# Patient Record
Sex: Female | Born: 1960
Health system: Southern US, Community
[De-identification: ages and names within clinical notes are randomized; demographics above are authoritative.]

## PROBLEM LIST (undated history)

## (undated) DIAGNOSIS — H409 Unspecified glaucoma: Secondary | ICD-10-CM

## (undated) DIAGNOSIS — N61 Mastitis without abscess: Secondary | ICD-10-CM

## (undated) DIAGNOSIS — Z87442 Personal history of urinary calculi: Secondary | ICD-10-CM

## (undated) DIAGNOSIS — Z8719 Personal history of other diseases of the digestive system: Secondary | ICD-10-CM

## (undated) DIAGNOSIS — D649 Anemia, unspecified: Secondary | ICD-10-CM

## (undated) DIAGNOSIS — C801 Malignant (primary) neoplasm, unspecified: Secondary | ICD-10-CM

## (undated) DIAGNOSIS — Z808 Family history of malignant neoplasm of other organs or systems: Secondary | ICD-10-CM

## (undated) DIAGNOSIS — K8020 Calculus of gallbladder without cholecystitis without obstruction: Secondary | ICD-10-CM

## (undated) DIAGNOSIS — C50919 Malignant neoplasm of unspecified site of unspecified female breast: Secondary | ICD-10-CM

## (undated) DIAGNOSIS — Z803 Family history of malignant neoplasm of breast: Secondary | ICD-10-CM

## (undated) DIAGNOSIS — L409 Psoriasis, unspecified: Secondary | ICD-10-CM

## (undated) DIAGNOSIS — E669 Obesity, unspecified: Secondary | ICD-10-CM

## (undated) DIAGNOSIS — Z8042 Family history of malignant neoplasm of prostate: Secondary | ICD-10-CM

## (undated) DIAGNOSIS — Z800 Family history of malignant neoplasm of digestive organs: Secondary | ICD-10-CM

## (undated) HISTORY — DX: Family history of malignant neoplasm of other organs or systems: Z80.8

## (undated) HISTORY — PX: EYE SURGERY: SHX253

## (undated) HISTORY — PX: GLAUCOMA SURGERY: SHX656

## (undated) HISTORY — DX: Family history of malignant neoplasm of breast: Z80.3

## (undated) HISTORY — DX: Family history of malignant neoplasm of digestive organs: Z80.0

## (undated) HISTORY — DX: Family history of malignant neoplasm of prostate: Z80.42

---

## 2005-01-08 ENCOUNTER — Emergency Department (HOSPITAL_COMMUNITY): Admission: EM | Admit: 2005-01-08 | Discharge: 2005-01-08 | Payer: Self-pay | Admitting: Emergency Medicine

## 2011-02-21 ENCOUNTER — Encounter: Payer: Self-pay | Admitting: *Deleted

## 2011-02-21 ENCOUNTER — Emergency Department (HOSPITAL_BASED_OUTPATIENT_CLINIC_OR_DEPARTMENT_OTHER)
Admission: EM | Admit: 2011-02-21 | Discharge: 2011-02-21 | Disposition: A | Discharge status: Home / Self Care | Payer: Self-pay | Attending: Emergency Medicine | Admitting: Emergency Medicine

## 2011-02-21 DIAGNOSIS — R1011 Right upper quadrant pain: Secondary | ICD-10-CM | POA: Insufficient documentation

## 2011-02-21 DIAGNOSIS — R112 Nausea with vomiting, unspecified: Secondary | ICD-10-CM | POA: Insufficient documentation

## 2011-02-21 DIAGNOSIS — K8020 Calculus of gallbladder without cholecystitis without obstruction: Secondary | ICD-10-CM | POA: Insufficient documentation

## 2011-02-21 HISTORY — DX: Calculus of gallbladder without cholecystitis without obstruction: K80.20

## 2011-02-21 LAB — DIFFERENTIAL
Basophils Absolute: 0 10*3/uL (ref 0.0–0.1)
Basophils Relative: 0 % (ref 0–1)
Eosinophils Absolute: 0 10*3/uL (ref 0.0–0.7)
Eosinophils Relative: 0 % (ref 0–5)
Lymphocytes Relative: 14 % (ref 12–46)
Lymphs Abs: 1.2 10*3/uL (ref 0.7–4.0)
Monocytes Absolute: 0.3 10*3/uL (ref 0.1–1.0)
Monocytes Relative: 4 % (ref 3–12)
Neutro Abs: 6.9 10*3/uL (ref 1.7–7.7)
Neutrophils Relative %: 81 % — ABNORMAL HIGH (ref 43–77)

## 2011-02-21 LAB — COMPREHENSIVE METABOLIC PANEL
ALT: 28 U/L (ref 0–35)
AST: 33 U/L (ref 0–37)
Albumin: 3.9 g/dL (ref 3.5–5.2)
Alkaline Phosphatase: 120 U/L — ABNORMAL HIGH (ref 39–117)
BUN: 17 mg/dL (ref 6–23)
CO2: 22 mEq/L (ref 19–32)
Calcium: 9.2 mg/dL (ref 8.4–10.5)
Chloride: 103 mEq/L (ref 96–112)
Creatinine, Ser: 0.8 mg/dL (ref 0.50–1.10)
GFR calc Af Amer: >60 mL/min (ref >60)
GFR calc non Af Amer: >60 mL/min (ref >60)
Glucose, Bld: 135 mg/dL — ABNORMAL HIGH (ref 70–99)
Potassium: 4.1 mEq/L (ref 3.5–5.1)
Sodium: 137 mEq/L (ref 135–145)
Total Bilirubin: 0.4 mg/dL (ref 0.3–1.2)
Total Protein: 7.9 g/dL (ref 6.0–8.3)

## 2011-02-21 LAB — CBC
HCT: 43.6 % (ref 36.0–46.0)
Hemoglobin: 15.2 g/dL — ABNORMAL HIGH (ref 12.0–15.0)
MCH: 32.4 pg (ref 26.0–34.0)
MCHC: 34.9 g/dL (ref 30.0–36.0)
MCV: 93 fL (ref 78.0–100.0)
Platelets: 203 10*3/uL (ref 150–400)
RBC: 4.69 MIL/uL (ref 3.87–5.11)
RDW: 13.2 % (ref 11.5–15.5)
WBC: 8.4 10*3/uL (ref 4.0–10.5)

## 2011-02-21 LAB — LIPASE, BLOOD: Lipase: 25 U/L (ref 11–59)

## 2011-02-21 MED ORDER — HYDROCODONE-ACETAMINOPHEN 5-325 MG PO TABS: 1.0000 | ORAL_TABLET | ORAL | Status: AC | PRN

## 2011-02-21 MED ORDER — ONDANSETRON HCL 4 MG/2ML IJ SOLN
INTRAMUSCULAR | Status: AC
  Administered 2011-02-21: 4 mg via INTRAVENOUS
  Filled 2011-02-21: qty 2

## 2011-02-21 MED ORDER — ONDANSETRON HCL 4 MG/2ML IJ SOLN
4.0000 mg | Freq: Once | INTRAMUSCULAR | Status: AC
  Administered 2011-02-21: 4 mg via INTRAVENOUS
  Filled 2011-02-21: qty 2

## 2011-02-21 MED ORDER — HYDROMORPHONE HCL 1 MG/ML IJ SOLN
1.0000 mg | Freq: Once | INTRAMUSCULAR | Status: AC
  Administered 2011-02-21: 1 mg via INTRAVENOUS
  Filled 2011-02-21: qty 1

## 2011-02-21 MED ORDER — ONDANSETRON HCL 4 MG PO TABS: 4.0000 mg | ORAL_TABLET | Freq: Four times a day (QID) | ORAL | Status: AC

## 2011-02-21 MED ORDER — SODIUM CHLORIDE 0.9 % IV SOLN
999.0000 mL | Freq: Once | INTRAVENOUS | Status: AC
  Administered 2011-02-21: 1000 mL via INTRAVENOUS

## 2011-02-21 MED ORDER — LORAZEPAM 2 MG/ML IJ SOLN
1.0000 mg | Freq: Once | INTRAMUSCULAR | Status: AC
  Administered 2011-02-21: 1 mg via INTRAVENOUS
  Filled 2011-02-21: qty 1

## 2011-02-21 MED ORDER — ONDANSETRON HCL 4 MG/2ML IJ SOLN
4.0000 mg | Freq: Once | INTRAMUSCULAR | Status: AC
  Administered 2011-02-21: 4 mg via INTRAVENOUS

## 2011-02-21 MED ORDER — MORPHINE SULFATE 4 MG/ML IJ SOLN
4.0000 mg | Freq: Once | INTRAMUSCULAR | Status: AC
  Administered 2011-02-21: 4 mg via INTRAVENOUS
  Filled 2011-02-21: qty 1

## 2011-02-21 NOTE — Discharge Instructions (Signed)
Abdominal Pain Abdominal pain can be caused by many things. Your caregiver decides the seriousness of your pain by an examination and possibly blood tests and X-rays. Many cases can be observed and treated at home. Most abdominal pain is not caused by a disease and will probably improve without treatment. However, in many cases, more time must pass before a clear cause of the pain can be found. Before that point, it may not be known if you need more testing, or if hospitalization or surgery is needed. HOME CARE INSTRUCTIONS  Do not take laxatives unless directed by your caregiver.   Take pain medicine only as directed by your caregiver.   Only take over-the-counter or prescription medicines for pain, discomfort, or fever as directed by your caregiver.   Try a clear liquid diet (broth, tea, or water) for 1 day or as ordered by your caregiver. Slowly move to a bland diet as tolerated.  SEEK IMMEDIATE MEDICAL CARE IF:  The pain does not go away.   You or your child has an oral temperature above 100.4, not controlled by medicine.   You keep throwing up (vomiting).   The pain is felt only in portions of the abdomen. Pain in the right side could possibly be appendicitis. In an adult, pain in the left lower portion of the abdomen could be colitis or diverticulitis.   You pass bloody or black tarry stools.  MAKE SURE YOU:  Understand these instructions.   Will watch your condition.   Will get help right away if you are not doing well or get worse.  Document Released: 02/21/2005 Document Re-Released: 08/08/2009 [facility] Patient Information 2011 [place], [place].Cholelithiasis, Gallbladder Disease (Gallstones) Gallstones are a form of gallbladder disease. When there is an infection of the gallbladder it is called cholecystitis. It is usually caused by a build-up of stones (gallstones or cholelithiasis) in your gallbladder. The gallbladder is not an essential organ. This means it is not necessary  for life. It is located slightly to the right of center in the belly (abdomen), behind the liver. It stores bile made in the liver. Bile aids in digestion and absorption of fats. Gallbladder disease may result in feeling sick to your stomach (nausea), abdominal pain, and jaundice. In severe cases, emergency surgery may be required. Gallstones are the most common type of gallbladder disease. They begin as small crystals and slowly grow into stones. Gallstone pain occurs when the gallbladder spasms and a gallstone is blocking the duct. Pain can also occur when a stone passes out of the duct. The pain usually begins suddenly. It may persist from several minutes to several hours. Infection can occur. Infection can add to discomfort and severity of an acute attack. The pain may be made worse by breathing deeply or by being jarred. There may be fever and tenderness to the touch. In some cases, when gallstones do not move into the bile duct, people have no pain or symptoms. These are called silent gallstones. Women are three times more likely to develop gallstones than men. Women who have had several pregnancies are more likely to have gallbladder disease. Physicians sometimes advise removing diseased gallbladders before future pregnancies. Other factors that increase the risk of gallbladder disease are obesity, diets heavy in fried foods and dairy products, increasing age, prolonged use of medications containing female hormones, and heredity. HOME CARE INSTRUCTIONS  Only take over-the-counter or prescription medicines for pain, discomfort, or fever as directed by your caregiver.   Follow a low fat diet  until seen again. (Fat causes the gallbladder to contract.)   Follow-up as instructed. Attacks are almost always recurrent and surgery is usually required for permanent treatment.  SEEK IMMEDIATE MEDICAL CARE IF:  Pain is increasing and is not controlled by medications.   You have an oral temperature above  100.4, not controlled by medication.   You develop nausea and vomiting.  MAKE SURE YOU:   Understand these instructions.   Will watch your condition.   Will get help right away if you are not doing well or get worse.  Document Released: 05/10/2005 Document Re-Released: 04/26/2008 [facility] Patient Information 2011 [place], [place].

## 2011-02-21 NOTE — ED Notes (Signed)
Pt amb to room 7 with quick steady gait in nad.  Reports a few months of intermittant right upper quad pain with nausea, states she is feeling much better now, one episode of emesis this am.

## 2011-02-21 NOTE — ED Provider Notes (Signed)
History     CSN: [id number] Arrival date & time: 02/21/2011  7:23 AM  Chief Complaint  Patient presents with  . Abdominal Pain    HPI  (Consider location/radiation/quality/duration/timing/severity/associated sxs/prior treatment)  HPI Comments: Patient is a 50 year old female who presents with sudden onset of right upper quadrant and epigastric abdominal pain at about 3:30 AM today. Patient states that the symptoms are associated with nausea and vomiting that is non-coffee-ground and nonbloody emesis. The pain is gradually improving although it is still present. She did have an episode of nausea and vomiting after arrival here in the ED. Patient denies any fevers. She gives a history of known gallstones with intermittent exacerbations. She is known about her gallstones for 4-5 years now. She does note that she had a fatty meal last night which may have set off this attack. She denies any urinary symptoms or any change in bowel habits. No cough or shortness of breath or chest pain.  Patient is a 50 y.o. female presenting with abdominal pain. The history is provided by the patient. No language interpreter was used.  Abdominal Pain The primary symptoms of the illness include abdominal pain, nausea and vomiting. The primary symptoms of the illness do not include fever, shortness of breath, diarrhea, hematemesis, hematochezia, dysuria, vaginal discharge or vaginal bleeding. The current episode started 3 to 5 hours ago. The onset of the illness was sudden. The problem has been gradually improving.  The abdominal pain began 3 to 5 hours ago. The pain came on suddenly. The abdominal pain has been gradually improving since its onset. The abdominal pain is located in the RUQ. The abdominal pain radiates to the back.  The illness is associated with eating. The patient states that she believes she is currently not pregnant. The patient has not had a change in bowel habit. Symptoms associated with the illness  do not include chills or back pain.    Past Medical History  Diagnosis Date  . Gallstones     History reviewed. No pertinent past surgical history.  History reviewed. No pertinent family history.  History  Substance Use Topics  . Smoking status: Never Smoker   . Smokeless tobacco: Not on file  . Alcohol Use: No    OB History    Grav Para Term Preterm Abortions TAB SAB Ect Mult Living                  Review of Systems  Review of Systems  Constitutional: Negative.  Negative for fever and chills.  HENT: Negative.   Eyes: Negative.  Negative for discharge and redness.  Respiratory: Negative.  Negative for cough and shortness of breath.   Cardiovascular: Negative.  Negative for chest pain.  Gastrointestinal: Positive for nausea, vomiting and abdominal pain. Negative for diarrhea, hematochezia and hematemesis.  Genitourinary: Negative.  Negative for dysuria, vaginal bleeding and vaginal discharge.  Musculoskeletal: Negative.  Negative for back pain.  Skin: Negative.  Negative for color change and rash.  Neurological: Negative.  Negative for syncope and headaches.  Hematological: Negative.  Negative for adenopathy.  Psychiatric/Behavioral: Negative.  Negative for confusion.  All other systems reviewed and are negative.    Allergies  Review of patient's allergies indicates no known allergies.  Home Medications  No current outpatient prescriptions on file.  Physical Exam    BP 138/88  Pulse 76  Temp(Src) 98 F (36.7 C) (Oral)  Resp 19  Ht 5\' 3"  (1.6 m)  Wt 235 lb (106.595 kg)  BMI 41.63 kg/m2  SpO2 100%  LMP 01/25/2011  Physical Exam  Constitutional: She is oriented to person, place, and time. She appears well-developed and well-nourished.  Non-toxic appearance. She does not have a sickly appearance.  HENT:  Head: Normocephalic and atraumatic.  Eyes: Conjunctivae, EOM and lids are normal. Pupils are equal, round, and reactive to light. No scleral icterus.    Neck: Trachea normal and normal range of motion. Neck supple.  Cardiovascular: Regular rhythm and normal heart sounds.   Pulmonary/Chest: Effort normal and breath sounds normal.  Abdominal: Soft. Normal appearance. There is tenderness. There is no rebound, no guarding, no CVA tenderness and negative Murphy's sign.       Patient has very mild right upper quadrant tenderness on exam but no positive Murphy sign.  Musculoskeletal: Normal range of motion.  Neurological: She is alert and oriented to person, place, and time. She has normal strength.  Skin: Skin is warm, dry and intact. No rash noted.  Psychiatric: She has a normal mood and affect. Her behavior is normal. Judgment and thought content normal.    ED Course  Procedures (including critical care time) Results for orders placed during the hospital encounter of 02/21/11  CBC      Component Value Range   WBC 8.4  4.0 - 10.5 (K/uL)   RBC 4.69  3.87 - 5.11 (MIL/uL)   Hemoglobin 15.2 (*) 12.0 - 15.0 (g/dL)   HCT 13.0  86.5 - 78.4 (%)   MCV 93.0  78.0 - 100.0 (fL)   MCH 32.4  26.0 - 34.0 (pg)   MCHC 34.9  30.0 - 36.0 (g/dL)   RDW 69.6  29.5 - 28.4 (%)   Platelets 203  150 - 400 (K/uL)  DIFFERENTIAL      Component Value Range   Neutrophils Relative 81 (*) 43 - 77 (%)   Neutro Abs 6.9  1.7 - 7.7 (K/uL)   Lymphocytes Relative 14  12 - 46 (%)   Lymphs Abs 1.2  0.7 - 4.0 (K/uL)   Monocytes Relative 4  3 - 12 (%)   Monocytes Absolute 0.3  0.1 - 1.0 (K/uL)   Eosinophils Relative 0  0 - 5 (%)   Eosinophils Absolute 0.0  0.0 - 0.7 (K/uL)   Basophils Relative 0  0 - 1 (%)   Basophils Absolute 0.0  0.0 - 0.1 (K/uL)  COMPREHENSIVE METABOLIC PANEL      Component Value Range   Sodium 137  135 - 145 (mEq/L)   Potassium 4.1  3.5 - 5.1 (mEq/L)   Chloride 103  96 - 112 (mEq/L)   CO2 22  19 - 32 (mEq/L)   Glucose, Bld 135 (*) 70 - 99 (mg/dL)   BUN 17  6 - 23 (mg/dL)   Creatinine, Ser 1.32  0.50 - 1.10 (mg/dL)   Calcium 9.2  8.4 - 44.0  (mg/dL)   Total Protein 7.9  6.0 - 8.3 (g/dL)   Albumin 3.9  3.5 - 5.2 (g/dL)   AST 33  0 - 37 (U/L)   ALT 28  0 - 35 (U/L)   Alkaline Phosphatase 120 (*) 39 - 117 (U/L)   Total Bilirubin 0.4  0.3 - 1.2 (mg/dL)   GFR calc non Af Amer >60  >60 (mL/min)   GFR calc Af Amer >60  >60 (mL/min)  LIPASE, BLOOD      Component Value Range   Lipase 25  11 - 59 (U/L)   No results found.  Labs Reviewed  CBC  DIFFERENTIAL  COMPREHENSIVE METABOLIC PANEL  LIPASE, BLOOD  URINALYSIS, ROUTINE W REFLEX MICROSCOPIC  PREGNANCY, URINE   No results found.   No diagnosis found.   MDM Patient with no clinical signs of acute cholecystitis given that she has no Murphy's sign and no LFT elevations and no fever here. Patient may have symptomatic cholelithiasis given that she knew she had a fatty meal last night and had onset of her typical symptoms with some nausea and vomiting this morning. Her symptoms are improving with pain medications and nausea medications here. When I went to reassess her patient was sleeping comfortably and notes that her symptoms are improved. She has seen a surgeon in the past and I have advised her to followup with them again if her symptoms become more persistent. I've also advised her that if her pain worsens or she has inability to tolerate by mouth intake over the next day or 2 she needs to return to ER for further care and she understands this.        Nat Christen, MD 02/21/11 1126

## 2018-10-26 ENCOUNTER — Emergency Department (HOSPITAL_COMMUNITY): Payer: Medicaid Other

## 2018-10-26 ENCOUNTER — Inpatient Hospital Stay (HOSPITAL_COMMUNITY)
Admission: EM | Admit: 2018-10-26 | Discharge: 2018-10-30 | DRG: 982 | Disposition: A | Discharge status: Home Health | Payer: Medicaid Other | Attending: Family Medicine | Admitting: Family Medicine

## 2018-10-26 ENCOUNTER — Other Ambulatory Visit: Payer: Self-pay

## 2018-10-26 ENCOUNTER — Encounter (HOSPITAL_COMMUNITY): Payer: Self-pay | Admitting: Emergency Medicine

## 2018-10-26 DIAGNOSIS — D519 Vitamin B12 deficiency anemia, unspecified: Secondary | ICD-10-CM | POA: Diagnosis present

## 2018-10-26 DIAGNOSIS — R590 Localized enlarged lymph nodes: Secondary | ICD-10-CM | POA: Diagnosis not present

## 2018-10-26 DIAGNOSIS — S2232XA Fracture of one rib, left side, initial encounter for closed fracture: Secondary | ICD-10-CM | POA: Diagnosis not present

## 2018-10-26 DIAGNOSIS — Z6841 Body Mass Index (BMI) 40.0 and over, adult: Secondary | ICD-10-CM | POA: Diagnosis not present

## 2018-10-26 DIAGNOSIS — J910 Malignant pleural effusion: Secondary | ICD-10-CM | POA: Diagnosis present

## 2018-10-26 DIAGNOSIS — E669 Obesity, unspecified: Secondary | ICD-10-CM | POA: Diagnosis present

## 2018-10-26 DIAGNOSIS — J90 Pleural effusion, not elsewhere classified: Secondary | ICD-10-CM

## 2018-10-26 DIAGNOSIS — Z20828 Contact with and (suspected) exposure to other viral communicable diseases: Secondary | ICD-10-CM | POA: Diagnosis present

## 2018-10-26 DIAGNOSIS — Z8249 Family history of ischemic heart disease and other diseases of the circulatory system: Secondary | ICD-10-CM | POA: Diagnosis not present

## 2018-10-26 DIAGNOSIS — L304 Erythema intertrigo: Secondary | ICD-10-CM | POA: Diagnosis present

## 2018-10-26 DIAGNOSIS — C7952 Secondary malignant neoplasm of bone marrow: Secondary | ICD-10-CM | POA: Diagnosis not present

## 2018-10-26 DIAGNOSIS — N6459 Other signs and symptoms in breast: Secondary | ICD-10-CM

## 2018-10-26 DIAGNOSIS — C799 Secondary malignant neoplasm of unspecified site: Secondary | ICD-10-CM | POA: Diagnosis not present

## 2018-10-26 DIAGNOSIS — Z853 Personal history of malignant neoplasm of breast: Secondary | ICD-10-CM | POA: Diagnosis not present

## 2018-10-26 DIAGNOSIS — C50919 Malignant neoplasm of unspecified site of unspecified female breast: Secondary | ICD-10-CM | POA: Diagnosis not present

## 2018-10-26 DIAGNOSIS — C7951 Secondary malignant neoplasm of bone: Secondary | ICD-10-CM | POA: Diagnosis not present

## 2018-10-26 DIAGNOSIS — R0902 Hypoxemia: Secondary | ICD-10-CM | POA: Diagnosis not present

## 2018-10-26 DIAGNOSIS — Z800 Family history of malignant neoplasm of digestive organs: Secondary | ICD-10-CM | POA: Diagnosis not present

## 2018-10-26 DIAGNOSIS — I2510 Atherosclerotic heart disease of native coronary artery without angina pectoris: Secondary | ICD-10-CM | POA: Diagnosis present

## 2018-10-26 DIAGNOSIS — T1490XA Injury, unspecified, initial encounter: Secondary | ICD-10-CM

## 2018-10-26 DIAGNOSIS — G893 Neoplasm related pain (acute) (chronic): Secondary | ICD-10-CM | POA: Diagnosis present

## 2018-10-26 DIAGNOSIS — C7802 Secondary malignant neoplasm of left lung: Secondary | ICD-10-CM | POA: Diagnosis not present

## 2018-10-26 DIAGNOSIS — R799 Abnormal finding of blood chemistry, unspecified: Secondary | ICD-10-CM | POA: Diagnosis not present

## 2018-10-26 DIAGNOSIS — C50911 Malignant neoplasm of unspecified site of right female breast: Secondary | ICD-10-CM | POA: Diagnosis present

## 2018-10-26 DIAGNOSIS — Z803 Family history of malignant neoplasm of breast: Secondary | ICD-10-CM

## 2018-10-26 DIAGNOSIS — B372 Candidiasis of skin and nail: Secondary | ICD-10-CM | POA: Diagnosis present

## 2018-10-26 DIAGNOSIS — I10 Essential (primary) hypertension: Secondary | ICD-10-CM | POA: Diagnosis not present

## 2018-10-26 DIAGNOSIS — M899 Disorder of bone, unspecified: Secondary | ICD-10-CM | POA: Diagnosis not present

## 2018-10-26 DIAGNOSIS — D649 Anemia, unspecified: Secondary | ICD-10-CM | POA: Diagnosis not present

## 2018-10-26 DIAGNOSIS — I700 Atherosclerosis of aorta: Secondary | ICD-10-CM | POA: Diagnosis present

## 2018-10-26 DIAGNOSIS — D630 Anemia in neoplastic disease: Secondary | ICD-10-CM | POA: Diagnosis present

## 2018-10-26 DIAGNOSIS — E876 Hypokalemia: Secondary | ICD-10-CM | POA: Diagnosis not present

## 2018-10-26 DIAGNOSIS — K8020 Calculus of gallbladder without cholecystitis without obstruction: Secondary | ICD-10-CM | POA: Diagnosis present

## 2018-10-26 DIAGNOSIS — R609 Edema, unspecified: Secondary | ICD-10-CM | POA: Diagnosis not present

## 2018-10-26 DIAGNOSIS — C782 Secondary malignant neoplasm of pleura: Secondary | ICD-10-CM | POA: Diagnosis not present

## 2018-10-26 DIAGNOSIS — Z808 Family history of malignant neoplasm of other organs or systems: Secondary | ICD-10-CM | POA: Diagnosis not present

## 2018-10-26 DIAGNOSIS — S2242XA Multiple fractures of ribs, left side, initial encounter for closed fracture: Secondary | ICD-10-CM | POA: Diagnosis present

## 2018-10-26 DIAGNOSIS — D539 Nutritional anemia, unspecified: Secondary | ICD-10-CM

## 2018-10-26 DIAGNOSIS — C779 Secondary and unspecified malignant neoplasm of lymph node, unspecified: Secondary | ICD-10-CM | POA: Diagnosis present

## 2018-10-26 DIAGNOSIS — C7801 Secondary malignant neoplasm of right lung: Secondary | ICD-10-CM | POA: Diagnosis not present

## 2018-10-26 DIAGNOSIS — R000 Tachycardia, unspecified: Secondary | ICD-10-CM | POA: Diagnosis not present

## 2018-10-26 DIAGNOSIS — R0602 Shortness of breath: Secondary | ICD-10-CM | POA: Diagnosis not present

## 2018-10-26 DIAGNOSIS — C801 Malignant (primary) neoplasm, unspecified: Secondary | ICD-10-CM | POA: Diagnosis not present

## 2018-10-26 DIAGNOSIS — R234 Changes in skin texture: Secondary | ICD-10-CM | POA: Diagnosis not present

## 2018-10-26 DIAGNOSIS — R195 Other fecal abnormalities: Secondary | ICD-10-CM | POA: Diagnosis present

## 2018-10-26 HISTORY — DX: Mastitis without abscess: N61.0

## 2018-10-26 HISTORY — DX: Obesity, unspecified: E66.9

## 2018-10-26 LAB — FERRITIN: Ferritin: 797 ng/mL — ABNORMAL HIGH (ref 11–307)

## 2018-10-26 LAB — RETICULOCYTES
Immature Retic Fract: 42.5 % — ABNORMAL HIGH (ref 2.3–15.9)
RBC.: 1.28 MIL/uL — ABNORMAL LOW (ref 3.87–5.11)
Retic Count, Absolute: 78.5 10*3/uL (ref 19.0–186.0)
Retic Ct Pct: 6.1 % — ABNORMAL HIGH (ref 0.4–3.1)

## 2018-10-26 LAB — PREPARE RBC (CROSSMATCH)

## 2018-10-26 LAB — BASIC METABOLIC PANEL
Anion gap: 15 (ref 5–15)
BUN: 21 mg/dL — ABNORMAL HIGH (ref 6–20)
CO2: 24 mmol/L (ref 22–32)
Calcium: 9.8 mg/dL (ref 8.9–10.3)
Chloride: 102 mmol/L (ref 98–111)
Creatinine, Ser: 0.95 mg/dL (ref 0.44–1.00)
GFR calc Af Amer: >60 mL/min (ref >60)
GFR calc non Af Amer: >60 mL/min (ref >60)
Glucose, Bld: 102 mg/dL — ABNORMAL HIGH (ref 70–99)
Potassium: 2.9 mmol/L — ABNORMAL LOW (ref 3.5–5.1)
Sodium: 141 mmol/L (ref 135–145)

## 2018-10-26 LAB — CBC WITH DIFFERENTIAL/PLATELET
Abs Immature Granulocytes: 0.2 10*3/uL — ABNORMAL HIGH (ref 0.00–0.07)
Band Neutrophils: 2 %
Basophils Absolute: 0.1 10*3/uL (ref 0.0–0.1)
Basophils Relative: 2 %
Eosinophils Absolute: 0.1 10*3/uL (ref 0.0–0.5)
Eosinophils Relative: 2 %
HCT: 15.8 % — ABNORMAL LOW (ref 36.0–46.0)
Hemoglobin: 4.7 g/dL — CL (ref 12.0–15.0)
Lymphocytes Relative: 22 %
Lymphs Abs: 1.5 10*3/uL (ref 0.7–4.0)
MCH: 34.3 pg — ABNORMAL HIGH (ref 26.0–34.0)
MCHC: 29.7 g/dL — ABNORMAL LOW (ref 30.0–36.0)
MCV: 115.3 fL — ABNORMAL HIGH (ref 80.0–100.0)
Metamyelocytes Relative: 3 %
Monocytes Absolute: 0.3 10*3/uL (ref 0.1–1.0)
Monocytes Relative: 5 %
Neutro Abs: 4.5 10*3/uL (ref 1.7–7.7)
Neutrophils Relative %: 64 %
Platelets: DECREASED 10*3/uL (ref 150–400)
RBC: 1.37 MIL/uL — ABNORMAL LOW (ref 3.87–5.11)
RDW: 21 % — ABNORMAL HIGH (ref 11.5–15.5)
WBC: 6.8 10*3/uL (ref 4.0–10.5)
nRBC: 4 /100 WBC — ABNORMAL HIGH

## 2018-10-26 LAB — POC OCCULT BLOOD, ED: Fecal Occult Bld: POSITIVE — AB

## 2018-10-26 LAB — IRON AND TIBC
Iron: 137 ug/dL (ref 28–170)
Saturation Ratios: 36 % — ABNORMAL HIGH (ref 10.4–31.8)
TIBC: 384 ug/dL (ref 250–450)
UIBC: 247 ug/dL

## 2018-10-26 LAB — FOLATE: Folate: 9.5 ng/mL (ref >5.9)

## 2018-10-26 LAB — SARS CORONAVIRUS 2 BY RT PCR (HOSPITAL ORDER, PERFORMED IN ~~LOC~~ HOSPITAL LAB): SARS Coronavirus 2: NEGATIVE

## 2018-10-26 LAB — BRAIN NATRIURETIC PEPTIDE: B Natriuretic Peptide: 48.6 pg/mL (ref 0.0–100.0)

## 2018-10-26 LAB — VITAMIN B12: Vitamin B-12: 767 pg/mL (ref 180–914)

## 2018-10-26 LAB — TROPONIN I: Troponin I: <0.03 ng/mL (ref <0.03)

## 2018-10-26 MED ORDER — SODIUM CHLORIDE 0.9 % IV SOLN: 10.0000 mL/h | Freq: Once | INTRAVENOUS | Status: DC

## 2018-10-26 MED ORDER — POTASSIUM CHLORIDE 10 MEQ/100ML IV SOLN: 10.0000 meq | Freq: Once | INTRAVENOUS | Status: DC

## 2018-10-26 MED ORDER — IOHEXOL 300 MG/ML  SOLN
75.0000 mL | Freq: Once | INTRAMUSCULAR | Status: AC | PRN
  Administered 2018-10-26: 22:00:00 75 mL via INTRAVENOUS

## 2018-10-26 MED ORDER — POTASSIUM CHLORIDE 10 MEQ/100ML IV SOLN
10.0000 meq | INTRAVENOUS | Status: AC
  Administered 2018-10-27 (×4): 10 meq via INTRAVENOUS
  Filled 2018-10-26 (×3): qty 100

## 2018-10-26 NOTE — ED Triage Notes (Signed)
Pt BIB EMS with c/o SOB x 1 month. States that it has been getting progressively worse. And worse with exertion. pt also c/o of possible breast cancer to her right breast and states it has been deformed for "months" but that she hasnt gone to a doctor in years.  RA saturation with EMS 93% (does not wear O2 at home) 2L [place] 100% P 115 ST BP 178/64 [id number] 20G IV LAC

## 2018-10-26 NOTE — ED Notes (Signed)
Pt has blood ready

## 2018-10-26 NOTE — ED Notes (Signed)
Patient transported to CT 

## 2018-10-26 NOTE — ED Provider Notes (Signed)
[facility] EMERGENCY DEPARTMENT Provider Note   CSN: [id number] Arrival date & time: 10/26/18  1815    History   Chief Complaint Chief Complaint  Patient presents with   Shortness of Breath    HPI JADAYA SOMMERFIELD is a 58 y.o. female cervical, presents emergency department today with chief complaint of shortness of breath x1 month.  Patient states it has progressively send over the last week.  Shortness of breath is worse with exertion, she does not wear oxygen at home.  Patient also reports bilateral lower extremity edema.  This is new for her, she states her feet and legs have been swollen for a while.  She reports she has not been to a doctor in years.   Patient states she has a rash on her chest x2 months.  She has had a rash like this in the past stating it was a yeast infection.  Unable to stand in the shower so she has been unable to wash her chest.  She denies any chest pain, cough, abdominal pain, nausea, vomiting.   Past Medical History:  Diagnosis Date   Gallstones     There are no active problems to display for this patient.   History reviewed. No pertinent surgical history.   OB History   No obstetric history on file.      Home Medications    Prior to Admission medications   Not on File    Family History No family history on file.  Social History Social History   Tobacco Use   Smoking status: Never Smoker   Smokeless tobacco: Never Used  Substance Use Topics   Alcohol use: No   Drug use: No     Allergies   Patient has no known allergies.   Review of Systems Review of Systems  Constitutional: Negative for chills and fever.  HENT: Negative for congestion, ear discharge, ear pain, sinus pressure, sinus pain and sore throat.   Eyes: Negative for pain and redness.  Respiratory: Positive for shortness of breath. Negative for cough.   Cardiovascular: Positive for leg swelling. Negative for chest pain.    Gastrointestinal: Positive for nausea. Negative for abdominal pain, constipation, diarrhea and vomiting.  Genitourinary: Negative for dysuria and hematuria.  Musculoskeletal: Negative for back pain and neck pain.  Skin: Negative for wound.  Neurological: Negative for weakness, numbness and headaches.     Physical Exam Updated Vital Signs BP (!) 155/82    Pulse (!) 108    Resp (!) 27    SpO2 97%   Physical Exam Vitals signs and nursing note reviewed.  Constitutional:      Appearance: She is ill-appearing.  HENT:     Head: Normocephalic and atraumatic.     Right Ear: Tympanic membrane and external ear normal.     Left Ear: Tympanic membrane and external ear normal.     Nose: Nose normal.     Mouth/Throat:     Mouth: Mucous membranes are moist.     Pharynx: Oropharynx is clear.  Eyes:     General: No scleral icterus.       Right eye: No discharge.        Left eye: No discharge.     Extraocular Movements: Extraocular movements intact.     Conjunctiva/sclera: Conjunctivae normal.     Pupils: Pupils are equal, round, and reactive to light.  Neck:     Musculoskeletal: Normal range of motion.     Vascular: No JVD.  Cardiovascular:     Rate and Rhythm: Regular rhythm. Tachycardia present.     Pulses: Normal pulses.          Radial pulses are 2+ on the right side and 2+ on the left side.     Heart sounds: Normal heart sounds.  Pulmonary:     Comments: Lung sounds diminished throughout.  She is speaking in short sentences, accessory muscle use noted. No wheezing, rales, or rhonchi. SpO2 on 2L [place] is 97% Chest:    Abdominal:     Comments: Abdomen is soft, non-distended, and non-tender in all quadrants. No rigidity, no guarding. No peritoneal signs.  Genitourinary:    Comments: Chaperone NT present for exam. Digital Rectal Exam reveals sphincter with good tone. No external hemorrhoids. No masses or fissures. Stool color is brown with no overt blood. No gross  melena.  Musculoskeletal: Normal range of motion.     Right lower leg: 2+ Pitting Edema present.     Left lower leg: 2+ Pitting Edema present.  Skin:    General: Skin is warm and dry.     Capillary Refill: Capillary refill takes less than 2 seconds.     Comments: Large area of erythema on chest between breasts  Neurological:     Mental Status: She is oriented to person, place, and time.     GCS: GCS eye subscore is 4. GCS verbal subscore is 5. GCS motor subscore is 6.     Comments: Fluent speech, no facial droop.  Psychiatric:        Behavior: Behavior normal.      ED Treatments / Results  Labs (all labs ordered are listed, but only abnormal results are displayed) Labs Reviewed  CBC WITH DIFFERENTIAL/PLATELET - Abnormal; Notable for the following components:      Result Value   RBC 1.37 (*)    Hemoglobin 4.7 (*)    HCT 15.8 (*)    MCV 115.3 (*)    MCH 34.3 (*)    MCHC 29.7 (*)    RDW 21.0 (*)    nRBC 4 (*)    Abs Immature Granulocytes 0.20 (*)    All other components within normal limits  BASIC METABOLIC PANEL - Abnormal; Notable for the following components:   Potassium 2.9 (*)    Glucose, Bld 102 (*)    BUN 21 (*)    All other components within normal limits  POC OCCULT BLOOD, ED - Abnormal; Notable for the following components:   Fecal Occult Bld POSITIVE (*)    All other components within normal limits  SARS CORONAVIRUS 2 (HOSPITAL ORDER, [facility])  BRAIN NATRIURETIC PEPTIDE  TROPONIN I  URINALYSIS, ROUTINE W REFLEX MICROSCOPIC  VITAMIN B12  FOLATE  IRON AND TIBC  FERRITIN  RETICULOCYTES  PATHOLOGIST SMEAR REVIEW  TYPE AND SCREEN    EKG EKG Interpretation  Date/Time:  Sunday Oct 26 2018 18:19:40 EDT Ventricular Rate:  114 PR Interval:    QRS Duration: 81 QT Interval:  271 QTC Calculation: 374 R Axis:   80 Text Interpretation:  Sinus tachycardia Low voltage, precordial leads Nonspecific repol abnormality, diffuse leads  No significant change was found Confirmed by Jola Schmidt [phone]) on 10/26/2018 7:30:44 PM   Radiology Dg Chest Portable 1 View  Result Date: 10/26/2018 CLINICAL DATA:  Shortness of breath, possible right breast cancer EXAM: PORTABLE CHEST 1 VIEW COMPARISON:  None. FINDINGS: Low lung volumes. No pleural effusion. Diffuse interstitial opacity. Heart size upper  limits of normal. No pneumothorax. Diffuse sclerosis and lucent lesions within the clavicles, ribs and shoulders. IMPRESSION: 1. Low lung volumes without consolidation or effusion. Diffuse bilateral interstitial opacity of unknown chronicity. Findings could be secondary to interstitial inflammatory process, atypical/viral pneumonia, or possible metastatic disease given clinical history. CT chest suggested for further evaluation 2. 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   On: 10/26/2018 20:11    Procedures .Critical Care Performed by: Cherre Robins, PA-C Authorized by: Cherre Robins, PA-C   Critical care provider statement:    Critical care time (minutes):  40   Critical care time was exclusive of:  Separately billable procedures and treating other patients and teaching time   Critical care was necessary to treat or prevent imminent or life-threatening deterioration of the following conditions: hemoglobin 4.9, tranfusion.   Critical care was time spent personally by me on the following activities:  Discussions with consultants, evaluation of patient's response to treatment, examination of patient, ordering and performing treatments and interventions, ordering and review of laboratory studies, ordering and review of radiographic studies, pulse oximetry, re-evaluation of patient's condition, obtaining history from patient or surrogate and review of old charts   (including critical care  time)  Medications Ordered in ED Medications - No data to display   Initial Impression / Assessment and Plan / ED Course  I have reviewed the triage vital signs and the nursing notes.  Pertinent labs & imaging results that were available during my care of the patient were reviewed by me and considered in my medical decision making (see chart for details).  Patient is ill-appearing.  On arrival she is tachycardic to 113 and tachypneic with 29 breaths/min.  Low-grade fever of 99.1.  On exam lung sounds are diminished throughout.  She has right breast tenderness and the nipple is inverted.  Also has bilateral 2+ pitting edema in lower extremities.  DDX includes PE, malignancy, pneumonia, GI bleed, CHF, COPD. Labs are significant for H/H of 4.7/15.8.  No leukocytosis.  Type and screen ordered as well as anemia panel, both are currently pending. Rectal exam performed with chaperone, no gross melena, no blood seen on exam.  However fecal occult is positive.  BMP shows potassium of 2.9, will replete with IV potassium.  chest x-ray viewed by me shows possible metastatic disease.  CT chest ordered for further evaluation.  Patient care transferred to Dr. Regenia Skeeter at the end of my shift. Patient presentation, ED course, and plan of care discussed with review of all pertinent labs and imaging. Please see his note for further details regarding further ED course and disposition.   Final Clinical Impressions(s) / ED Diagnoses   Final diagnoses:  None    ED Discharge Orders    None       Flint Melter 10/26/18 2217    Sherwood Gambler, MD 10/27/18 0010

## 2018-10-26 NOTE — H&P (Addendum)
[facility] Admission History and Physical Service Pager: [phone]  Patient name: Suzanne [name] Medical record number: [id number] Date of birth: March 31, 1961 Age: 58 y.o. Gender: female  Primary Care Provider: Patient, No Pcp Per Consultants:  Code Status: Full  Emergency Contact: Shanique Aslinger [phone]  Chief Complaint: Worsening SOB  Assessment and Plan: Suzanne [name] is a 58 y.o. female presenting with worsening SOB and found to have Hgb 4.5. No significant PMH.   Dyspnea new onset. Likely multifactorial from symptomatic anemia and newly discovered possible metastatic disease Progressively worsening SOB with both exertion and at rest for 1 month. Etiology is likely multifactorial including acute severe anemia of 4.5 on admission and concern for new metastatic disease noted on CT and CXR with extensive interstitial thickening and reticulonodular lung opacities bilaterally, moderate bilateral pleural effusions, and numerous mediastinal, hilar, and axillary lymphnodes. Pleural effusions likely 2/2 to metastatic disease but given no prior echo and new bilateral LE edema heart failure exacerbation is possible, however BNP of 48.6 is reassuring. Well's Score 1.5 for tachycardia, 2.5 if counting possible malignancy. Unlikely infection given afebrile, mild cough, and lack of leukocytosis. EKG with sinus tachycardia and no known history of arrhythmia.  In the ED, patient was tachycardic with tachypnea and did exhibit diffuse pallor on exam. Otherwise, hemodynamically stable and afebrile.  - admit to med-tele, FPTS, attending Dr. Mingo Amber - s/p 2U pRBC in ED - follow up post-transfusion H&H - continuous cardiac monitor, pulse ox - Echo  - consider d-dimer/CTA if no improvement in vitals after blood transfusion - Consult Heme/onc in AM - diagnostic thoracentesis - AM CBC, CMP, A1c, Lipids, PT/INR, TSH, LDH, ACTH - I/O's, daily weights - orthostatics once  improved - PT/OT eval - NPO pending further workup - SCD's - consider palliative consult if indicated  Acute Severe Anemia: Hgb on admission 4.5. MCV 115.3. Retic 6.1. Ferritin elevated to 797. Iron, TIBC, Folate and B12 all WNL. No history of bleeding and no signs of blood loss on exam, however FOBT positive.  - S/p 2 units pRBC in ED - follow up post-transfusion H&H - transfusion threshold <7 - can consider consult GI pending hemoglobin response to transfusion - NPO pending further work up - SCDs given possible acute bleed  Acute Right Breast Changes  Concern for new metastatic disease of undetermined primary: New onset areolar thickening (see media) with intermittent bloody discharge and inversion of nipple and CT positive for extensive interstitial thickening and reticulonodular opacities throughout the lungs with bilateral axillary, mediastinal, and hilar lymphadenopathy, as well as extensive diffuse mixed lytic and sclerotic lesions throughout visualized skeleton compatible with diffuse skeletal metastases. Reported weight loss and anorexia. Concern for inflammatory breast cancer. Family history of late age breast cancer in sister. - follow up labs as above - consult heme/onc in AM  Hypokalemia: K 2.9 on admission, Mag 2.1. Unclear etiology other than poor PO intake. Given diffuse skeletal findings on CT, possibly 2/2 to new malignancy of unknown primary. - give 40 mEq IV k+ - AM CMP - follow up labs above - replace as needed  Elevated Alkaline Phosphatase: Cholelithiasis noted on CT. Denies any RUQ pain, although currently experiencing right breast pain that may be shielding symptoms. Denies any nausea or vomiting. AST/ALT and bilirubin levels WNL.  - follow up LDH levels - consider further imaging if indicated  New onset LE edema  Bilateral Pleural effusion Exam with trace to 1+ pitting exam on exam and bilateral pleural effusions  on imaging. No echo in chart review.  -  follow up echo  Inframammary Candidal Intertrigo: Severe intertrigo along bilateral inframammary folds.  - Clotrimazole BID x 2-4 weeks  Atherosclerosis/CAD: Aortic atherosclerosis and moderate coronary artery calcifications in the left anterior descending coronary artery on CT scan. EKG with sinus tachy without ST changes. Troponin negative on admission. Denies any chest pain. No known history of CAD. - obtain risk stratifying labs: A1C, lipids - AM EKG - follow up Echo  FEN/GI: NPO  Prophylaxis: SCDs  Disposition: Med-tele for further work up  History of Present Illness:  Suzanne [name] is a 58 y.o. female presenting with worsening SOB x 3 weeks. She notes she would walk across the house and get very SOB and have to sit down to catch her breath. She notes more LE edema x 2-3 weeks, does no improve with elevation. Shes had some post nasal drip and intermittent productive cough of clear plegm. Denies any fever, chills, nausea, vomiting, abdominal pain. Denies any hemoptysis, hematemesis, melena.  She notes she had one small amount of blood after blowing her nose, but otherwise no nose bleeds. Does endorse some "lighter than normal stools". Does endorse a small amount of bright red blood on the toilet paper after a hard bowel movement, thought to be due to hemorrhoids. Last happened 1 week ago.    Denies any vaginal bleeding. Notes menopause at age 30. Not currently on medications.  Notes she began to get SOB when lying flat on her back that improved when lying on her side. Feels like she can breath better when she sits up and forward.  Denies any chest pain.  She also notes her right nipple has becomes thicker in consistency and inverted x several months. Does note some small "bloody" discharge once in a while.  Notes some occasional sharp nipple pain at night when she lays on her right side, she states it "feels like someone is taking a knife and slicing through my nipple." Notes improvement  with ibuprofen. Endorses decreased appetite and unintentional weight loss.  Denies ever having colonoscopy or mammogram as she has not sought any medical care for many years. Notes her sister had bread cancer and brother with a skin cancer. Denies alcohol use, tobacco, illicit drugs. Only medication is OTC ibuprofen PRN.  She endorses a rash under her breasts x 1 week, foul smelling. Hasnt been able to have adequate shower x 2-3 weeks due to fatigue when standing. Had it once before that went away with "tea tree oil and coconut oil".  Review Of Systems: Per HPI with the following additions:   Review of Systems  Constitutional: Positive for malaise/fatigue and weight loss. Negative for chills and fever.  HENT: Negative for nosebleeds and sore throat.   Respiratory: Positive for cough, sputum production and shortness of breath. Negative for hemoptysis.   Cardiovascular: Positive for orthopnea and leg swelling. Negative for chest pain and palpitations.  Gastrointestinal: Positive for constipation. Negative for abdominal pain, blood in stool, diarrhea, melena, nausea and vomiting.  Genitourinary: Negative for dysuria, hematuria and urgency.  Skin: Positive for rash (under breasts).  Neurological: Positive for weakness. Negative for dizziness and headaches.   There are no active problems to display for this patient.   Past Medical History: Past Medical History:  Diagnosis Date  . Gallstones     Past Surgical History: History reviewed. No pertinent surgical history.  Social History: Social History   Tobacco Use  . Smoking status: Never  Smoker  . Smokeless tobacco: Never Used  Substance Use Topics  . Alcohol use: No  . Drug use: No   Additional social history: Denies alcohol use, tobacco, illicit drugs Please also refer to relevant sections of EMR.  Family History: Family History  Problem Relation Age of Onset  . Breast cancer Sister        in her 61s  . Heart disease Brother         CABG  . Heart disease Brother        CABG  . Heart disease Sister        CAD with stent  . Skin cancer Brother     Allergies and Medications: No Known Allergies No current facility-administered medications on file prior to encounter.    No current outpatient medications on file prior to encounter.    Objective: BP (!) 154/73   Pulse (!) 109   Temp 99.1 F (37.3 C) (Oral)   Resp (!) 22   SpO2 93%  Exam: General: pale appearing but pleasant older lady, hard of hearing at baseline, NAD, sitting up comfortably in ED bed, conversing easily  HEENT: normocephalic, atraumatic, moist mucous membranes, oropharynx clear without erythema or exudates, (+) conjunctival pallor Neck: supple, normal ROM CV: mildly tachycardic but regular rhythm without murmurs, rubs, or gallops, trace to 1+ LE edema bilaterally, 1+ radial pulses, pedal pulses difficult to appreciate due to edema Lungs: clear to auscultation bilaterally with normal work of breathing on oxygen Abdomen: soft, non-tender, non-distended, normoactive bowel sounds Skin: large breasts bilaterally, R breast retracted, right areola erythematous with thick texture, slightly inverted nipple, inframammary folds severely erythematous with scaline, diffuse pallor, (+) conjunctival pallor Extremities: trace to 1+ LE edema, warm and well perfused, normal tone Neuro: Alert and oriented, speech normal        Labs and Imaging: CBC BMET  Recent Labs  Lab 10/26/18 1921  WBC 6.8  HGB 4.7*  HCT 15.8*  PLT PLATELET CLUMPS NOTED ON SMEAR, COUNT APPEARS DECREASED   Recent Labs  Lab 10/26/18 1921  NA 141  K 2.9*  CL 102  CO2 24  BUN 21*  CREATININE 0.95  GLUCOSE 102*  CALCIUM 9.8     BNP: 48.6 Trop <0.03 COVID neg FOBT positive B12  767 Folate 9.5 Iron 137, TIBC 384 Ferritin 797 Retic 6.1 Mag 2.1 Albumin 2.7 Alk Phos 379 Bilirubin 1.2, Direct Bili 0.3 Total protein: 6.0  Ct Chest W Contrast  Result Date:  10/26/2018 CLINICAL DATA:  Shortness of breath, possible metastatic disease. Breast cancer. EXAM: CT CHEST WITH CONTRAST TECHNIQUE: Multidetector CT imaging of the chest was performed during intravenous contrast administration. CONTRAST:  70m OMNIPAQUE IOHEXOL 300 MG/ML  SOLN COMPARISON:  Chest x-ray earlier today FINDINGS: Cardiovascular: Mild cardiomegaly. Moderate coronary artery calcifications in the left anterior descending coronary artery. Aorta is normal caliber. Scattered atherosclerotic change. Mediastinum/Nodes: Numerous borderline sized and mildly enlarged mediastinal lymph nodes. Right paratracheal node measures 12 mm. Borderline size bilateral hilar lymph nodes, the largest on the right measuring 11 mm in short axis diameter. Numerous enlarged bilateral axillary lymph nodes with index left axillary lymph node having a short axis diameter of 2.1 cm on image 21 and right axillary lymph node having a short axis diameter of 1.4 cm on image 48. Lungs/Pleura: Moderate bilateral pleural effusions. Extensive interstitial disease throughout the lungs bilaterally. Mild reticulonodular densities. Cannot exclude lymphangitic spread of tumor. Upper Abdomen: Gallstones noted within the gallbladder. Musculoskeletal: Chest wall soft tissues  are unremarkable. Extensive diffuse mixed lytic and sclerotic lesions throughout the visualized skeleton compatible with diffuse skeletal metastases. IMPRESSION: Bilateral axillary and mediastinal adenopathy. Borderline hilar lymph nodes bilaterally. Findings likely reflect metastatic lymphadenopathy. Moderate bilateral pleural effusions. Extensive interstitial thickening and reticulonodular opacities throughout the lungs. Cannot exclude lymphangitic spread of tumor. Diffuse skeletal metastases. Cholelithiasis. Coronary artery disease. Aortic Atherosclerosis (ICD10-I70.0). Electronically Signed   By: Rolm Baptise M.D.   On: 10/26/2018 22:54   Dg Chest Portable 1 View  Result  Date: 10/26/2018 CLINICAL DATA:  Shortness of breath, possible right breast cancer EXAM: PORTABLE CHEST 1 VIEW COMPARISON:  None. FINDINGS: Low lung volumes. No pleural effusion. Diffuse interstitial opacity. Heart size upper limits of normal. No pneumothorax. Diffuse sclerosis and lucent lesions within the clavicles, ribs and shoulders. IMPRESSION: 1. Low lung volumes without consolidation or effusion. Diffuse bilateral interstitial opacity of unknown chronicity. Findings could be secondary to interstitial inflammatory process, atypical/viral pneumonia, or possible metastatic disease given clinical history. CT chest suggested for further evaluation 2. 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   On: 10/26/2018 20:11   Danna Hefty, DO 10/26/2018, 11:02 PM PGY-1, [facility] Intern pager: [phone], text pages welcome  FPTS Upper-Level Resident Addendum  I have independently interviewed and examined the patient. I have discussed the above with the original author and agree with their documentation. My edits for correction/addition/clarification are in blue. Please see also any attending notes.   Bufford Lope, DO PGY-3, [facility] 10/27/2018 6:31 AM  FPTS Service pager: [phone] (text pages welcome through [facility])

## 2018-10-27 ENCOUNTER — Inpatient Hospital Stay (HOSPITAL_COMMUNITY): Payer: Medicaid Other

## 2018-10-27 ENCOUNTER — Encounter (HOSPITAL_COMMUNITY): Payer: Self-pay | Admitting: Family Medicine

## 2018-10-27 DIAGNOSIS — C50911 Malignant neoplasm of unspecified site of right female breast: Secondary | ICD-10-CM

## 2018-10-27 DIAGNOSIS — C799 Secondary malignant neoplasm of unspecified site: Secondary | ICD-10-CM

## 2018-10-27 DIAGNOSIS — C7951 Secondary malignant neoplasm of bone: Secondary | ICD-10-CM

## 2018-10-27 DIAGNOSIS — C7802 Secondary malignant neoplasm of left lung: Secondary | ICD-10-CM

## 2018-10-27 DIAGNOSIS — C779 Secondary and unspecified malignant neoplasm of lymph node, unspecified: Secondary | ICD-10-CM

## 2018-10-27 DIAGNOSIS — N6459 Other signs and symptoms in breast: Secondary | ICD-10-CM | POA: Insufficient documentation

## 2018-10-27 DIAGNOSIS — R0602 Shortness of breath: Secondary | ICD-10-CM

## 2018-10-27 DIAGNOSIS — C7952 Secondary malignant neoplasm of bone marrow: Secondary | ICD-10-CM

## 2018-10-27 DIAGNOSIS — D539 Nutritional anemia, unspecified: Secondary | ICD-10-CM

## 2018-10-27 DIAGNOSIS — R234 Changes in skin texture: Secondary | ICD-10-CM

## 2018-10-27 DIAGNOSIS — E876 Hypokalemia: Secondary | ICD-10-CM

## 2018-10-27 DIAGNOSIS — C7801 Secondary malignant neoplasm of right lung: Secondary | ICD-10-CM

## 2018-10-27 LAB — CBC
HCT: 22.5 % — ABNORMAL LOW (ref 36.0–46.0)
Hemoglobin: 7.3 g/dL — ABNORMAL LOW (ref 12.0–15.0)
MCH: 32.3 pg (ref 26.0–34.0)
MCHC: 32.4 g/dL (ref 30.0–36.0)
MCV: 99.6 fL (ref 80.0–100.0)
Platelets: 97 10*3/uL — ABNORMAL LOW (ref 150–400)
RBC: 2.26 MIL/uL — ABNORMAL LOW (ref 3.87–5.11)
RDW: 23.6 % — ABNORMAL HIGH (ref 11.5–15.5)
WBC: 7.4 10*3/uL (ref 4.0–10.5)
nRBC: 3.6 % — ABNORMAL HIGH (ref 0.0–0.2)

## 2018-10-27 LAB — HEPATIC FUNCTION PANEL
ALT: 25 U/L (ref 0–44)
AST: 41 U/L (ref 15–41)
Albumin: 2.7 g/dL — ABNORMAL LOW (ref 3.5–5.0)
Alkaline Phosphatase: 379 U/L — ABNORMAL HIGH (ref 38–126)
Bilirubin, Direct: 0.3 mg/dL — ABNORMAL HIGH (ref 0.0–0.2)
Indirect Bilirubin: 0.9 mg/dL (ref 0.3–0.9)
Total Bilirubin: 1.2 mg/dL (ref 0.3–1.2)
Total Protein: 6 g/dL — ABNORMAL LOW (ref 6.5–8.1)

## 2018-10-27 LAB — LIPID PANEL
Cholesterol: 111 mg/dL (ref 0–200)
HDL: 24 mg/dL — ABNORMAL LOW (ref >40)
LDL Cholesterol: 64 mg/dL (ref 0–99)
Total CHOL/HDL Ratio: 4.6 RATIO
Triglycerides: 114 mg/dL (ref <150)
VLDL: 23 mg/dL (ref 0–40)

## 2018-10-27 LAB — COMPREHENSIVE METABOLIC PANEL
ALT: 24 U/L (ref 0–44)
AST: 38 U/L (ref 15–41)
Albumin: 2.8 g/dL — ABNORMAL LOW (ref 3.5–5.0)
Alkaline Phosphatase: 367 U/L — ABNORMAL HIGH (ref 38–126)
Anion gap: 11 (ref 5–15)
BUN: 17 mg/dL (ref 6–20)
CO2: 28 mmol/L (ref 22–32)
Calcium: 9.9 mg/dL (ref 8.9–10.3)
Chloride: 102 mmol/L (ref 98–111)
Creatinine, Ser: 1.04 mg/dL — ABNORMAL HIGH (ref 0.44–1.00)
GFR calc Af Amer: >60 mL/min (ref >60)
GFR calc non Af Amer: 59 mL/min — ABNORMAL LOW (ref >60)
Glucose, Bld: 92 mg/dL (ref 70–99)
Potassium: 3.6 mmol/L (ref 3.5–5.1)
Sodium: 141 mmol/L (ref 135–145)
Total Bilirubin: 1.9 mg/dL — ABNORMAL HIGH (ref 0.3–1.2)
Total Protein: 6.1 g/dL — ABNORMAL LOW (ref 6.5–8.1)

## 2018-10-27 LAB — ECHOCARDIOGRAM COMPLETE
Height: 63 in
Weight: 3633.18 oz

## 2018-10-27 LAB — PROTIME-INR
INR: 1.1 (ref 0.8–1.2)
Prothrombin Time: 14.3 seconds (ref 11.4–15.2)

## 2018-10-27 LAB — ABO/RH: ABO/RH(D): O POS

## 2018-10-27 LAB — PATHOLOGIST SMEAR REVIEW

## 2018-10-27 LAB — TSH: TSH: 3.557 u[IU]/mL (ref 0.350–4.500)

## 2018-10-27 LAB — HIV ANTIBODY (ROUTINE TESTING W REFLEX): HIV Screen 4th Generation wRfx: NONREACTIVE

## 2018-10-27 LAB — MAGNESIUM: Magnesium: 2.1 mg/dL (ref 1.7–2.4)

## 2018-10-27 LAB — HEMOGLOBIN A1C
Hgb A1c MFr Bld: 5.3 % (ref 4.8–5.6)
Mean Plasma Glucose: 105.41 mg/dL

## 2018-10-27 LAB — CORTISOL: Cortisol, Plasma: 16.3 ug/dL

## 2018-10-27 LAB — LACTATE DEHYDROGENASE: LDH: 312 U/L — ABNORMAL HIGH (ref 98–192)

## 2018-10-27 MED ORDER — ACETAMINOPHEN 325 MG PO TABS
650.0000 mg | ORAL_TABLET | Freq: Four times a day (QID) | ORAL | Status: DC | PRN
  Administered 2018-10-27 – 2018-10-30 (×4): 650 mg via ORAL
  Filled 2018-10-27 (×3): qty 2

## 2018-10-27 MED ORDER — PERFLUTREN LIPID MICROSPHERE
1.0000 mL | INTRAVENOUS | Status: AC | PRN
  Administered 2018-10-27: 2 mL via INTRAVENOUS
  Filled 2018-10-27: qty 10

## 2018-10-27 MED ORDER — CLOTRIMAZOLE 1 % EX CREA
TOPICAL_CREAM | Freq: Two times a day (BID) | CUTANEOUS | Status: DC
  Administered 2018-10-27 – 2018-10-30 (×6): via TOPICAL
  Filled 2018-10-27 (×2): qty 15

## 2018-10-27 MED ORDER — ANASTROZOLE 1 MG PO TABS
1.0000 mg | ORAL_TABLET | Freq: Every day | ORAL | Status: DC
  Administered 2018-10-28 – 2018-10-30 (×4): 1 mg via ORAL
  Filled 2018-10-27 (×4): qty 1

## 2018-10-27 MED ORDER — TECHNETIUM TC 99M MEDRONATE IV KIT
20.0000 | PACK | Freq: Once | INTRAVENOUS | Status: AC | PRN
  Administered 2018-10-27: 20 via INTRAVENOUS

## 2018-10-27 MED ORDER — ONDANSETRON 4 MG PO TBDP
4.0000 mg | ORAL_TABLET | Freq: Three times a day (TID) | ORAL | Status: DC | PRN
  Administered 2018-10-28: 4 mg via ORAL
  Filled 2018-10-27: qty 1

## 2018-10-27 NOTE — Consult Note (Addendum)
[facility]  Telephone:([phone]) [phone] Fax:(336) [phone]  ID: Suzanne [name] DOB: [birth year] MR#: [id number] CWC#:[id number] PCP: Patient, No Pcp Per  CHIEF COMPLAINT: Metastatic cancer; suspicious for breast as the primary.  INTERVAL HISTORY: [name] is a 58 year old female from [place], [place] without significant past medical history with exception of obesity, remote history of gallstones, and remote history of mastitis.  The patient presents to the hospital with a 3-week history of worsening shortness of breath and lower extremity edema.  The patient reported that her shortness of breath worsened with exertion.  Her lower extremity edema did not improve with elevation.  She also had an intermittent productive cough with rare sputum production.  The patient was noted to be anemic with a hemoglobin of 4.5 with a clumped platelets noted on admission.  Stool for occult blood was positive.  Patient received 2 units of packed red blood cells.  The patient reported a rash under her breast x1 week which has been foul-smelling.  She also reported that she has some discomfort and thickening/hardness of her right breast for several months.  She states that her right breast is much larger than her left.  She notes some occasional bloody discharge from her breast once in a while.  A chest x-ray on admission showed diffuse bilateral interstitial opacity of unknown chronicity, findings could be related to interstitial inflammatory process, atypical/viral pneumonia, or possible metastatic disease.  There are also sclerotic and lytic lesions within the clavicles, bilateral ribs, shoulders which was concerning for diffuse few skeletal metastatic disease.  This prompted a CT scan of the chest with contrast which showed bilateral axillary and mediastinal adenopathy, borderline hilar lymph nodes bilaterally, findings likely flecked metastatic lymphadenopathy, moderate bilateral pleural effusions,  extensive interstitial thickening and reticulonodular opacities throughout the lungs, cannot exclude lymphangitic spread of tumor, diffuse skeletal metastases.  Today, the patient reports that she has had a 2 to 3-week history of increasing shortness of breath and lower extremity edema.  She has had a cough with clear mucus production.  She also reports anorexia but has lost very little weight, only 5 to 10 pounds.  Denies night sweats.  Denies dizziness, headaches, blurred vision, palpable lymphadenopathy, chest discomfort.  Reports intermittent nausea and vomiting secondary to postnasal drip.  Denies constipation and diarrhea.  She denies bleeding.  Patient reports generalized arthralgias in her back and shoulders which come and go.  The patient reports that has been many years since she has had a mammogram.  She has never had a colonoscopy.  She reports that has been many years since she has had a Pap smear.  She reports that she does not have a primary care provider and has not sought medical care in many years.  Medical oncology was asked to the patient due to concerns of metastatic cancer, likely breast as the primary.  REVIEW OF SYSTEMS: A comprehensive 14 point review of systems was negative except as noted in the HPI.  PAST MEDICAL HISTORY: Past Medical History:  Diagnosis Date   Gallstones    Obesity    PAST SURGICAL HISTORY: History reviewed. No pertinent surgical history. FAMILY HISTORY Family History  Problem Relation Age of Onset   Breast cancer Sister        in her 34s   Heart disease Brother        CABG   Heart disease Brother        CABG   Heart disease Sister  CAD with stent   Skin cancer Brother    GYNECOLOGIC HISTORY: Had one pregnancy and one live child.  Has not had a Pap smear in many years.  SOCIAL HISTORY: The patient is widowed.  The patient lives with her daughter, Suzanne [name], who has cerebral palsy.  The patient is self-employed.  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  Has history of alcohol and tobacco use.  ADVANCED DIRECTIVES: She does not have any advanced directives.  HEALTH MAINTENANCE: Social History   Tobacco Use   Smoking status: Never Smoker   Smokeless tobacco: Never Used  Substance Use Topics   Alcohol use: No   Drug use: No   Colonoscopy: None PAP: Has not had a Pap in many years Bone density: None Lipid panel: 10/27/2018  No Known Allergies Current Facility-Administered Medications  Medication Dose Route Frequency Provider Last Rate Last Dose   clotrimazole (LOTRIMIN) 1 % cream   Topical BID Mullis, Kiersten P, DO       perflutren lipid microspheres (DEFINITY) IV suspension  1-10 mL Intravenous PRN Sherwood Gambler, MD   2 mL at 10/27/18 1053   OBJECTIVE: Vitals:   10/27/18 0233 10/27/18 0433  BP: (!) 157/80 (!) 144/86  Pulse: (!) 103 100  Resp: 20 18  Temp: 98.4 F (36.9 C) 98.8 F (37.1 C)  SpO2: 98% 98%   Body mass index is 40.22 kg/m. ECOG FS:1 - Symptomatic but completely ambulatory Ocular: Sclerae unicteric, pupils equal, round and reactive to light Ear-nose-throat: Oropharynx clear, dentition fair Lymphatic: No cervical or supraclavicular adenopathy Lungs no rales or rhonchi, good excursion bilaterally Heart regular rate and rhythm, no murmur appreciated.  1+ bilateral lower extremity edema. Abd soft, nontender, positive bowel sounds MSK no focal spinal tenderness, no joint edema Neuro: non-focal, well-oriented, appropriate affect Breasts: Right nipple inverted.  Right breast with thickened texture with palpable mass.  No palpable masses in the left breast.  Erythema below the bilateral breasts.       LAB RESULTS: CMP     Component Value Date/Time   NA 141 10/27/2018 0859   K 3.6 10/27/2018 0859   CL 102 10/27/2018 0859   CO2 28 10/27/2018 0859   GLUCOSE 92 10/27/2018 0859   BUN 17 10/27/2018 0859   CREATININE 1.04 (H) 10/27/2018 0859   CALCIUM 9.9  10/27/2018 0859   PROT 6.1 (L) 10/27/2018 0859   ALBUMIN 2.8 (L) 10/27/2018 0859   AST 38 10/27/2018 0859   ALT 24 10/27/2018 0859   ALKPHOS 367 (H) 10/27/2018 0859   BILITOT 1.9 (H) 10/27/2018 0859   GFRNONAA 59 (L) 10/27/2018 0859   GFRAA >60 10/27/2018 0859   INo results found for: SPEP, UPEP Lab Results  Component Value Date   WBC 7.4 10/27/2018   NEUTROABS 4.5 10/26/2018   HGB 7.3 (L) 10/27/2018   HCT 22.5 (L) 10/27/2018   MCV 99.6 10/27/2018   PLT 97 (L) 10/27/2018   @LASTCHEMISTRY @ No results found for: LABCA2 No components found for: LABCA125 Recent Labs  Lab 10/27/18 0859  INR 1.1   Urinalysis No results found for: COLORURINE, APPEARANCEUR, LABSPEC, PHURINE, GLUCOSEU, HGBUR, BILIRUBINUR, KETONESUR, PROTEINUR, UROBILINOGEN, NITRITE, LEUKOCYTESUR STUDIES: Ct Chest W Contrast  Result Date: 10/26/2018 CLINICAL DATA:  Shortness of breath, possible metastatic disease. Breast cancer. EXAM: CT CHEST WITH CONTRAST TECHNIQUE: Multidetector CT imaging of the chest was performed during intravenous contrast administration. CONTRAST:  95mL OMNIPAQUE IOHEXOL 300 MG/ML  SOLN COMPARISON:  Chest x-ray earlier today  FINDINGS: Cardiovascular: Mild cardiomegaly. Moderate coronary artery calcifications in the left anterior descending coronary artery. Aorta is normal caliber. Scattered atherosclerotic change. Mediastinum/Nodes: Numerous borderline sized and mildly enlarged mediastinal lymph nodes. Right paratracheal node measures 12 mm. Borderline size bilateral hilar lymph nodes, the largest on the right measuring 11 mm in short axis diameter. Numerous enlarged bilateral axillary lymph nodes with index left axillary lymph node having a short axis diameter of 2.1 cm on image 21 and right axillary lymph node having a short axis diameter of 1.4 cm on image 48. Lungs/Pleura: Moderate bilateral pleural effusions. Extensive interstitial disease throughout the lungs bilaterally. Mild reticulonodular  densities. Cannot exclude lymphangitic spread of tumor. Upper Abdomen: Gallstones noted within the gallbladder. Musculoskeletal: Chest wall soft tissues are unremarkable. Extensive diffuse mixed lytic and sclerotic lesions throughout the visualized skeleton compatible with diffuse skeletal metastases. IMPRESSION: Bilateral axillary and mediastinal adenopathy. Borderline hilar lymph nodes bilaterally. Findings likely reflect metastatic lymphadenopathy. Moderate bilateral pleural effusions. Extensive interstitial thickening and reticulonodular opacities throughout the lungs. Cannot exclude lymphangitic spread of tumor. Diffuse skeletal metastases. Cholelithiasis. Coronary artery disease. Aortic Atherosclerosis (ICD10-I70.0). Electronically Signed   By: Rolm Baptise M.D.   On: 10/26/2018 22:54   Dg Chest Portable 1 View  Result Date: 10/26/2018 CLINICAL DATA:  Shortness of breath, possible right breast cancer EXAM: PORTABLE CHEST 1 VIEW COMPARISON:  None. FINDINGS: Low lung volumes. No pleural effusion. Diffuse interstitial opacity. Heart size upper limits of normal. No pneumothorax. Diffuse sclerosis and lucent lesions within the clavicles, ribs and shoulders. IMPRESSION: 1. Low lung volumes without consolidation or effusion. Diffuse bilateral interstitial opacity of unknown chronicity. Findings could be secondary to interstitial inflammatory process, atypical/viral pneumonia, or possible metastatic disease given clinical history. CT chest suggested for further evaluation 2. 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   On: 10/26/2018 20:11   ASSESSMENT: 58 y.o. female from [place], [place] with   1. Metastatic cancer.  Given her changes in her right breast, suspect this represents a stage IV breast cancer. 2.  Pleural effusions  3.  Anemia 4.   Obesity  PLAN: -Obtain CA 27.29, bone scan, and will need a biopsy.  She will have an ultrasound-guided thoracentesis to drain her pleural fluid.  We will send fluid for cytology and breast prognostic panel. -Anemia work-up has been reviewed.  Anemia is likely due to her underlying malignancy and GI blood loss.  Transfuse packed red blood cells for hemoglobin less than 7.0 or active bleeding.  Consider GI work-up. -Once the above work-up is complete, will make further recommendations regarding treatment of this patient's cancer.  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  Will start on anastrozole pending results of cytology.  Have made follow-up appointment at the [facility] 10/31/2018 at 10 AM for further discussion of diagnosis and treatment.  Will follow with you.     Chauncey Cruel, MD Medical Oncology and Hematology [facility] [address] [place],  [address] Tel. [phone]    Fax. [phone]

## 2018-10-27 NOTE — Evaluation (Signed)
Physical Therapy Evaluation Patient Details Name: Suzanne [name] MRN: [id number] DOB: 08-Feb-1961 Today's Date: 10/27/2018   History of Present Illness  pt admitted 10/26/18 with Progressively worsening SOB with both exertion and at rest for 1 month. Etiology is likely multifactorial including acute severe anemia of 4.5 on admission and concern for new metastatic disease noted on CT and CXR with extensive interstitial thickening and reticulonodular lung opacities bilaterally, moderate bilateral pleural effusions, and numerous mediastinal, hilar, and axillary lymphnodes. Pleural effusions likely 2/2 to metastatic disease but given no prior echo and new bilateral LE edema heart failure exacerbation is possible, however BNP of 48.6 is reassuring. Well's Score 1.5 for tachycardia, 2.5 if counting possible malignancy. Unlikely infection given afebrile, mild cough, and lack of leukocytosis. EKG with sinus tachycardia and no known history of arrhythmia.  Clinical Impression  Pt presents with SOB with exertion, impaired balance and activity tolerance and will benefit from skilled PT services to address deficits and improve functional mobility. Pt performs transfers to [facility] with min guard and is able to perform hygiene with supervision.  Pt performs transfers on 2LO2 with spO2 99%.  Pt performs gait x 12' on room air with spO2 94%. RN made aware of pt's O2 saturations.     Follow Up Recommendations Home health PT    Equipment Recommendations  Other (comment)(TBD, may need rollator)    Recommendations for Other Services       Precautions / Restrictions Precautions Precautions: Fall Restrictions Weight Bearing Restrictions: No      Mobility  Bed Mobility Overal bed mobility: Modified Independent                Transfers Overall transfer level: Needs assistance Equipment used: None Transfers: Sit to/from Stand;Stand Pivot Transfers Sit to Stand: Min guard Stand pivot transfers: Min guard       General transfer comment: pt performs sit <>stand from bed and BSC with min guard for balance  Ambulation/Gait Ambulation/Gait assistance: Min assist Gait Distance (Feet): 12 Feet Assistive device: None       General Gait Details: pt with forward flexed posture, decreased cadence, decreased activity toelrance with gait. unable to make it all the way to her door and back, had to turn after 6 feet  Stairs            Wheelchair Mobility    Modified Rankin (Stroke Patients Only)       Balance Overall balance assessment: Mild deficits observed, not formally tested                                           Pertinent Vitals/Pain Pain Assessment: No/denies pain    Home Living Family/patient expects to be discharged to:: Private residence Living Arrangements: Children Available Help at Discharge: Family;Available 24 hours/day Type of Home: House Home Access: Stairs to enter Entrance Stairs-Rails: Can reach both Entrance Stairs-Number of Steps: 6 Home Layout: One level Home Equipment: None      Prior Function Level of Independence: Independent         Comments: reports she had been needing help with IADLs for the past 2 weeks     Hand Dominance        Extremity/Trunk Assessment   Upper Extremity Assessment Upper Extremity Assessment: LUE deficits/detail LUE Deficits / Details: decreased shoulder flexion strength and ROM due to pain    Lower Extremity Assessment  Lower Extremity Assessment: Generalized weakness    Cervical / Trunk Assessment Cervical / Trunk Assessment: Kyphotic  Communication   Communication: No difficulties  Cognition Arousal/Alertness: Awake/alert Behavior During Therapy: WFL for tasks assessed/performed Overall Cognitive Status: Within Functional Limits for tasks assessed                                        General Comments      Exercises     Assessment/Plan    PT Assessment  Patient needs continued PT services  PT Problem List Decreased strength;Decreased activity tolerance;Decreased balance;Decreased mobility;Cardiopulmonary status limiting activity       PT Treatment Interventions DME instruction;Therapeutic exercise;Gait training;Balance training;Stair training;Neuromuscular re-education;Modalities;Functional mobility training;Therapeutic activities;Patient/family education    PT Goals (Current goals can be found in the Care Plan section)  Acute Rehab PT Goals Patient Stated Goal: feel better PT Goal Formulation: With patient Time For Goal Achievement: 11/10/18 Potential to Achieve Goals: Good    Frequency Min 3X/week   Barriers to discharge        Co-evaluation               AM-PAC PT "6 Clicks" Mobility  Outcome Measure Help needed turning from your back to your side while in a flat bed without using bedrails?: None Help needed moving from lying on your back to sitting on the side of a flat bed without using bedrails?: None Help needed moving to and from a bed to a chair (including a wheelchair)?: A Little Help needed standing up from a chair using your arms (e.g., wheelchair or bedside chair)?: A Little Help needed to walk in hospital room?: A Lot Help needed climbing 3-5 steps with a railing? : Total 6 Click Score: 17    End of Session Equipment Utilized During Treatment: Oxygen Activity Tolerance: Patient limited by fatigue Patient left: in bed;with call bell/phone within reach;with bed alarm set Nurse Communication: Mobility status PT Visit Diagnosis: Muscle weakness (generalized) (M62.81);Difficulty in walking, not elsewhere classified (R26.2)    Time: 7425-9563 PT Time Calculation (min) (ACUTE ONLY): 24 min   Charges:   PT Evaluation $PT Eval Moderate Complexity: 1 Mod PT Treatments $Therapeutic Activity: 8-22 mins       Isabelle Course, PT, DPT  , 10/27/2018, 12:48 PM

## 2018-10-27 NOTE — ED Notes (Signed)
ED TO INPATIENT HANDOFF REPORT  ED Nurse Name and Phone #: [phone]  S Name/Age/Gender Suzanne [name] 58 y.o. female Room/Bed: 044C/044C  Code Status   Code Status: Full Code  Home/SNF/Other Home Patient oriented to: self, place, time and situation Is this baseline? Yes   Triage Complete: Triage complete  Chief Complaint SOB  Triage Note Pt BIB EMS with c/o SOB x 1 month. States that it has been getting progressively worse. And worse with exertion. pt also c/o of possible breast cancer to her right breast and states it has been deformed for "months" but that she hasnt gone to a doctor in years.  RA saturation with EMS 93% (does not wear O2 at home) 2L [place] 100% P 115 ST BP 178/64 CBG158 20G IV LAC   Allergies No Known Allergies  Level of Care/Admitting Diagnosis ED Disposition    ED Disposition Condition [facility]: [place] [[id number]]  Level of Care: Telemetry Medical [104]  Covid Evaluation: Confirmed COVID Negative  Diagnosis: Acute anemia [[id number]]  Admitting Physician: Danna Hefty [[id number]]  Attending Physician: Mingo Amber, JEFFREY H [[id number]]  Estimated length of stay: 3 - 4 days  Certification:: I certify this patient will need inpatient services for at least 2 midnights  PT Class (Do Not Modify): Inpatient [101]  PT Acc Code (Do Not Modify): Private [1]       B Medical/Surgery History Past Medical History:  Diagnosis Date  . Gallstones    History reviewed. No pertinent surgical history.   A IV Location/Drains/Wounds Patient Lines/Drains/Airways Status   Active Line/Drains/Airways    Name:   Placement date:   Placement time:   Site:   Days:   Peripheral IV 10/26/18 Left Antecubital   10/26/18    2213    Antecubital   1   Peripheral IV 10/26/18 Right Antecubital   10/26/18    2356    Antecubital   1          Intake/Output Last 24 hours No intake or output data in the 24 hours ending 10/27/18  0036  Labs/Imaging Results for orders placed or performed during the hospital encounter of 10/26/18 (from the past 48 hour(s))  CBC with Differential     Status: Abnormal   Collection Time: 10/26/18  7:21 PM  Result Value Ref Range   WBC 6.8 4.0 - 10.5 K/uL   RBC 1.37 (L) 3.87 - 5.11 MIL/uL   Hemoglobin 4.7 (LL) 12.0 - 15.0 g/dL    Comment: This critical result has verified and been called to RN P JOHNSTON by Chanetta Marshall on 05 31 2020 at 2019, and has been read back.  REPEATED TO VERIFY CORRECTED ON 05/31 AT 2105: PREVIOUSLY REPORTED AS 4.7 This critical result has verified and been called to RN P JOHNSTON by Chanetta Marshall on 05 31 2020 at 2019, and has been read back.     HCT 15.8 (L) 36.0 - 46.0 %   MCV 115.3 (H) 80.0 - 100.0 fL   MCH 34.3 (H) 26.0 - 34.0 pg   MCHC 29.7 (L) 30.0 - 36.0 g/dL   RDW 21.0 (H) 11.5 - 15.5 %   Platelets  150 - 400 K/uL    PLATELET CLUMPS NOTED ON SMEAR, COUNT APPEARS DECREASED   Neutrophils Relative % 64 %   Neutro Abs 4.5 1.7 - 7.7 K/uL   Band Neutrophils 2 %   Lymphocytes Relative 22 %   Lymphs Abs 1.5  0.7 - 4.0 K/uL   Monocytes Relative 5 %   Monocytes Absolute 0.3 0.1 - 1.0 K/uL   Eosinophils Relative 2 %   Eosinophils Absolute 0.1 0.0 - 0.5 K/uL   Basophils Relative 2 %   Basophils Absolute 0.1 0.0 - 0.1 K/uL   nRBC 4 (H) 0 /100 WBC   Metamyelocytes Relative 3 %   Abs Immature Granulocytes 0.20 (H) 0.00 - 0.07 K/uL   Polychromasia PRESENT     Comment: Performed at [facility] [address], [place], [place] [address]  Basic metabolic panel     Status: Abnormal   Collection Time: 10/26/18  7:21 PM  Result Value Ref Range   Sodium 141 135 - 145 mmol/L   Potassium 2.9 (L) 3.5 - 5.1 mmol/L   Chloride 102 98 - 111 mmol/L   CO2 24 22 - 32 mmol/L   Glucose, Bld 102 (H) 70 - 99 mg/dL   BUN 21 (H) 6 - 20 mg/dL   Creatinine, Ser 0.95 0.44 - 1.00 mg/dL   Calcium 9.8 8.9 - 10.3 mg/dL   GFR calc non Af Amer >60 >60 mL/min   GFR  calc Af Amer >60 >60 mL/min   Anion gap 15 5 - 15    Comment: Performed at [facility], [place] [address] [place], [place] [address]  Brain natriuretic peptide     Status: None   Collection Time: 10/26/18  7:21 PM  Result Value Ref Range   B Natriuretic Peptide 48.6 0.0 - 100.0 pg/mL    Comment: Performed at [facility] [address] [place], [place] [address]  Troponin I - ONCE - STAT     Status: None   Collection Time: 10/26/18  8:23 PM  Result Value Ref Range   Troponin I <0.03 <0.03 ng/mL    Comment: Performed at [facility], [place] [address], [place],  [address]  SARS Coronavirus 2 (CEPHEID - Performed in [facility] hospital lab), Hosp Order     Status: None   Collection Time: 10/26/18  8:49 PM  Result Value Ref Range   SARS Coronavirus 2 NEGATIVE NEGATIVE    Comment: (NOTE) If result is NEGATIVE SARS-CoV-2 target nucleic acids are NOT DETECTED. The SARS-CoV-2 RNA is generally detectable in upper and lower  respiratory specimens during the acute phase of infection. The lowest  concentration of SARS-CoV-2 viral copies this assay can detect is 250  copies / mL. A negative result does not preclude SARS-CoV-2 infection  and should not be used as the sole basis for treatment or other  patient management decisions.  A negative result may occur with  improper specimen collection / handling, submission of specimen other  than nasopharyngeal swab, presence of viral mutation(s) within the  areas targeted by this assay, and inadequate number of viral copies  (<250 copies / mL). A negative result must be combined with clinical  observations, patient history, and epidemiological information. If result is POSITIVE SARS-CoV-2 target nucleic acids are DETECTED. The SARS-CoV-2 RNA is generally detectable in upper and lower  respiratory specimens dur ing the acute phase of infection.  Positive  results are indicative of active infection with SARS-CoV-2.  Clinical   correlation with patient history and other diagnostic information is  necessary to determine patient infection status.  Positive results do  not rule out bacterial infection or co-infection with other viruses. If result is PRESUMPTIVE POSTIVE SARS-CoV-2 nucleic acids MAY BE PRESENT.  A presumptive positive result was obtained on the submitted specimen  and confirmed on repeat testing.  While 2019 novel coronavirus  (SARS-CoV-2) nucleic acids may be present in the submitted sample  additional confirmatory testing may be necessary for epidemiological  and / or clinical management purposes  to differentiate between  SARS-CoV-2 and other Sarbecovirus currently known to infect humans.  If clinically indicated additional testing with an alternate test  methodology [phone]) is advised. The SARS-CoV-2 RNA is generally  detectable in upper and lower respiratory sp ecimens during the acute  phase of infection. The expected result is Negative. Fact Sheet for Patients:  StrictlyIdeas.no Fact Sheet for Healthcare Providers: BankingDealers.co.za This test is not yet approved or cleared by the [place] FDA and has been authorized for detection and/or diagnosis of SARS-CoV-2 by FDA under an Emergency Use Authorization (EUA).  This EUA will remain in effect (meaning this test can be used) for the duration of the COVID-19 declaration under Section 564(b)(1) of the Act, 21 U.S.C. section 360bbb-3(b)(1), unless the authorization is terminated or revoked sooner. Performed at [facility], [place] [address] [place], [place] [address]   Type and screen [place]     Status: None (Preliminary result)   Collection Time: 10/26/18  8:49 PM  Result Value Ref Range   ABO/RH(D) O POS    Antibody Screen NEG    Sample Expiration 10/29/2018,2359    Unit Number [id number]    Blood Component Type RBC, LR IRR    Unit division 00     Status of Unit ISSUED    Transfusion Status OK TO TRANSFUSE    Crossmatch Result      Compatible Performed at [facility], [place] [address] [place], [place] [address]    Unit Number [id number]    Blood Component Type RED CELLS,LR    Unit division 00    Status of Unit ALLOCATED    Transfusion Status OK TO TRANSFUSE    Crossmatch Result Compatible   ABO/Rh     Status: None (Preliminary result)   Collection Time: 10/26/18  8:49 PM  Result Value Ref Range   ABO/RH(D)      O POS Performed at [facility] [address] [place], [place] [address]   POC occult blood, ED Provider will collect     Status: Abnormal   Collection Time: 10/26/18  9:15 PM  Result Value Ref Range   Fecal Occult Bld POSITIVE (A) NEGATIVE  Vitamin B12     Status: None   Collection Time: 10/26/18  9:46 PM  Result Value Ref Range   Vitamin B-12 767 180 - 914 pg/mL    Comment: (NOTE) This assay is not validated for testing neonatal or myeloproliferative syndrome specimens for Vitamin B12 levels. Performed at [facility], [place] [address] [place], [place] [address]   Folate     Status: None   Collection Time: 10/26/18  9:46 PM  Result Value Ref Range   Folate 9.5 >5.9 ng/mL    Comment: Performed at [facility], [place] [address], [place], [place] [address]  Iron and TIBC     Status: Abnormal   Collection Time: 10/26/18  9:46 PM  Result Value Ref Range   Iron 137 28 - 170 ug/dL   TIBC 384 250 - 450 ug/dL   Saturation Ratios 36 (H) 10.4 - 31.8 %   UIBC 247 ug/dL    Comment: Performed at [facility], [place] [address] [place], [place] [address]  Ferritin     Status: Abnormal   Collection Time: 10/26/18  9:46 PM  Result Value Ref Range   Ferritin 797 (H) 11 - 307 ng/mL    Comment: Performed at [facility], [place] [address] [place], [place] [address]  Reticulocytes     Status: Abnormal   Collection Time: 10/26/18  9:46 PM  Result Value Ref Range   Retic Ct Pct 6.1 (H)  0.4 - 3.1 %   RBC. 1.28 (L) 3.87 - 5.11 MIL/uL   Retic Count, Absolute 78.5 19.0 - 186.0 K/uL   Immature Retic Fract 42.5 (H) 2.3 - 15.9 %    Comment: Performed at [facility] [address] [place], [place] [address]  Prepare RBC     Status: None   Collection Time: 10/26/18 10:16 PM  Result Value Ref Range   Order Confirmation      ORDER PROCESSED BY BLOOD BANK Performed at [facility], [place] [address], [place], [place] [address]    Ct Chest W Contrast  Result Date: 10/26/2018 CLINICAL DATA:  Shortness of breath, possible metastatic disease. Breast cancer. EXAM: CT CHEST WITH CONTRAST TECHNIQUE: Multidetector CT imaging of the chest was performed during intravenous contrast administration. CONTRAST:  19mL OMNIPAQUE IOHEXOL 300 MG/ML  SOLN COMPARISON:  Chest x-ray earlier today FINDINGS: Cardiovascular: Mild cardiomegaly. Moderate coronary artery calcifications in the left anterior descending coronary artery. Aorta is normal caliber. Scattered atherosclerotic change. Mediastinum/Nodes: Numerous borderline sized and mildly enlarged mediastinal lymph nodes. Right paratracheal node measures 12 mm. Borderline size bilateral hilar lymph nodes, the largest on the right measuring 11 mm in short axis diameter. Numerous enlarged bilateral axillary lymph nodes with index left axillary lymph node having a short axis diameter of 2.1 cm on image 21 and right axillary lymph node having a short axis diameter of 1.4 cm on image 48. Lungs/Pleura: Moderate bilateral pleural effusions. Extensive interstitial disease throughout the lungs bilaterally. Mild reticulonodular densities. Cannot exclude lymphangitic spread of tumor. Upper Abdomen: Gallstones noted within the gallbladder. Musculoskeletal: Chest wall soft tissues are unremarkable. Extensive diffuse mixed lytic and sclerotic lesions throughout the visualized skeleton compatible with diffuse skeletal metastases. IMPRESSION: Bilateral axillary and  mediastinal adenopathy. Borderline hilar lymph nodes bilaterally. Findings likely reflect metastatic lymphadenopathy. Moderate bilateral pleural effusions. Extensive interstitial thickening and reticulonodular opacities throughout the lungs. Cannot exclude lymphangitic spread of tumor. Diffuse skeletal metastases. Cholelithiasis. Coronary artery disease. Aortic Atherosclerosis (ICD10-I70.0). Electronically Signed   By: Rolm Baptise M.D.   On: 10/26/2018 22:54   Dg Chest Portable 1 View  Result Date: 10/26/2018 CLINICAL DATA:  Shortness of breath, possible right breast cancer EXAM: PORTABLE CHEST 1 VIEW COMPARISON:  None. FINDINGS: Low lung volumes. No pleural effusion. Diffuse interstitial opacity. Heart size upper limits of normal. No pneumothorax. Diffuse sclerosis and lucent lesions within the clavicles, ribs and shoulders. IMPRESSION: 1. Low lung volumes without consolidation or effusion. Diffuse bilateral interstitial opacity of unknown chronicity. Findings could be secondary to interstitial inflammatory process, atypical/viral pneumonia, or possible metastatic disease given clinical history. CT chest suggested for further evaluation 2. 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   On: 10/26/2018 20:11    Pending Labs Unresulted Labs (From admission, onward)    Start     Ordered   10/27/18 0500  Basic  metabolic panel  Tomorrow morning,   R     10/26/18 2342   10/27/18 0500  CBC  Tomorrow morning,   R     10/26/18 2342   10/27/18 0500  Protime-INR  Tomorrow morning,   R     10/26/18 2342   10/27/18 0500  Lipid panel  Tomorrow morning,   R     10/26/18 2342   10/27/18 0500  TSH  Tomorrow morning,   R     10/26/18 2342   10/27/18 0500  Hemoglobin A1c  Tomorrow morning,   R     10/26/18 2342   10/26/18 2331  Hepatic function panel  Add-on,   R     10/26/18 2330    10/26/18 2331  HIV antibody (Routine Testing)  Once,   R     10/26/18 2342   10/26/18 2329  Magnesium  Add-on,   STAT     10/26/18 2328   10/26/18 1921  Pathologist smear review  Once,   STAT     10/26/18 1921   10/26/18 1850  Urinalysis, Routine w reflex microscopic  Once,   R     10/26/18 1849          Vitals/Pain Today's Vitals   10/26/18 2315 10/26/18 2336 10/27/18 0000 10/27/18 0030  BP: (!) 166/85 (!) 153/92 (!) 161/85 (!) 156/83  Pulse: (!) 113 (!) 107 (!) 107 (!) 103  Resp: (!) 27 (!) 26 (!) 26 (!) 27  Temp:  98.8 F (37.1 C)    TempSrc:  Oral    SpO2: 97% 96% 96% 96%  PainSc:        Isolation Precautions No active isolations  Medications Medications  potassium chloride 10 mEq in 100 mL IVPB (10 mEq Intravenous New Bag/Given 10/27/18 0025)  iohexol (OMNIPAQUE) 300 MG/ML solution 75 mL (75 mLs Intravenous Contrast Given 10/26/18 2228)    Mobility Usually able to ambulate at home, becomes increased sob with ambulation Low fall risk   Focused Assessments Cardiac Assessment Handoff:    Lab Results  Component Value Date   TROPONINI <0.03 10/26/2018   No results found for: DDIMER Does the Patient currently have chest pain? No  , Pulmonary Assessment Handoff:  Lung sounds:   O2 Device: Nasal Cannula O2 Flow Rate (L/min): 2 L/min      R Recommendations: See Admitting Provider Note  Report given to: [facility] RN  Additional Notes:

## 2018-10-27 NOTE — Progress Notes (Signed)
Chaplain responded to spiritual consult. Patient not in room when chaplain came by.  Left Advanced Directive paperwork with nurse to convey to patient.  Will be available to answer questions when patient returns. Rev. Tamsen Snider Pager [phone]

## 2018-10-27 NOTE — Consult Note (Addendum)
Reason for Consult: Guaiac positive anemia Referring Physician: Hospital team  Suzanne [name] is an 58 y.o. female.  HPI: Patient seen and examined in her hospital computer chart reviewed and case discussed with both the hospital team and the oncology team as well and she has noted a little constipation over the last month and has seen a little bright red blood occasionally on the toilet tissue but otherwise has no other GI complaints and her family history is negative for any GI problems except for one cousin with colon cancer and her older sister did have breast cancer as well and she realized she had a problem 2 to 3 months ago but has not seen a doctor in years has no anemia history her stools have not been black in fact they have been even lighter than usual and she has noticed some breast and nipple changes and no other specific complaints and she has not had any previous GI work-up  Past Medical History:  Diagnosis Date  . Gallstones   . Mastitis    reports history of recurrent mastitis  . Obesity     History reviewed. No pertinent surgical history.  Family History  Problem Relation Age of Onset  . Breast cancer Sister        in her 67's-70s  . Heart disease Brother        CABG  . Heart disease Brother        CABG  . Heart disease Sister        CAD with stent  . Skin cancer Brother   . Colon cancer Cousin     Social History:  reports that she has never smoked. She has never used smokeless tobacco. She reports that she does not drink alcohol or use drugs.  Allergies: No Known Allergies  Medications: I have reviewed the patient's current medications.  Results for orders placed or performed during the hospital encounter of 10/26/18 (from the past 48 hour(s))  CBC with Differential     Status: Abnormal   Collection Time: 10/26/18  7:21 PM  Result Value Ref Range   WBC 6.8 4.0 - 10.5 K/uL   RBC 1.37 (L) 3.87 - 5.11 MIL/uL   Hemoglobin 4.7 (LL) 12.0 - 15.0 g/dL   Comment: This critical result has verified and been called to RN P JOHNSTON by Chanetta Marshall on 05 31 2020 at 2019, and has been read back.  REPEATED TO VERIFY CORRECTED ON 05/31 AT 2105: PREVIOUSLY REPORTED AS 4.7 This critical result has verified and been called to RN P JOHNSTON by Chanetta Marshall on 05 31 2020 at 2019, and has been read back.     HCT 15.8 (L) 36.0 - 46.0 %   MCV 115.3 (H) 80.0 - 100.0 fL   MCH 34.3 (H) 26.0 - 34.0 pg   MCHC 29.7 (L) 30.0 - 36.0 g/dL   RDW 21.0 (H) 11.5 - 15.5 %   Platelets  150 - 400 K/uL    PLATELET CLUMPS NOTED ON SMEAR, COUNT APPEARS DECREASED   Neutrophils Relative % 64 %   Neutro Abs 4.5 1.7 - 7.7 K/uL   Band Neutrophils 2 %   Lymphocytes Relative 22 %   Lymphs Abs 1.5 0.7 - 4.0 K/uL   Monocytes Relative 5 %   Monocytes Absolute 0.3 0.1 - 1.0 K/uL   Eosinophils Relative 2 %   Eosinophils Absolute 0.1 0.0 - 0.5 K/uL   Basophils Relative 2 %   Basophils Absolute 0.1 0.0 -  0.1 K/uL   nRBC 4 (H) 0 /100 WBC   Metamyelocytes Relative 3 %   Abs Immature Granulocytes 0.20 (H) 0.00 - 0.07 K/uL   Polychromasia PRESENT     Comment: Performed at [facility] [address] [place], [place] [address]  Basic metabolic panel     Status: Abnormal   Collection Time: 10/26/18  7:21 PM  Result Value Ref Range   Sodium 141 135 - 145 mmol/L   Potassium 2.9 (L) 3.5 - 5.1 mmol/L   Chloride 102 98 - 111 mmol/L   CO2 24 22 - 32 mmol/L   Glucose, Bld 102 (H) 70 - 99 mg/dL   BUN 21 (H) 6 - 20 mg/dL   Creatinine, Ser 0.95 0.44 - 1.00 mg/dL   Calcium 9.8 8.9 - 10.3 mg/dL   GFR calc non Af Amer >60 >60 mL/min   GFR calc Af Amer >60 >60 mL/min   Anion gap 15 5 - 15    Comment: Performed at [facility], [place] [address] [place], [place] [address]  Brain natriuretic peptide     Status: None   Collection Time: 10/26/18  7:21 PM  Result Value Ref Range   B Natriuretic Peptide 48.6 0.0 - 100.0 pg/mL    Comment: Performed at [facility] [address] [place], [place] [address]  Troponin I - ONCE - STAT     Status: None   Collection Time: 10/26/18  8:23 PM  Result Value Ref Range   Troponin I <0.03 <0.03 ng/mL    Comment: Performed at [facility], [place] [address] [place], [place] [address]  SARS Coronavirus 2 (CEPHEID - Performed in [facility] hospital lab), Hosp Order     Status: None   Collection Time: 10/26/18  8:49 PM  Result Value Ref Range   SARS Coronavirus 2 NEGATIVE NEGATIVE    Comment: (NOTE) If result is NEGATIVE SARS-CoV-2 target nucleic acids are NOT DETECTED. The SARS-CoV-2 RNA is generally detectable in upper and lower  respiratory specimens during the acute phase of infection. The lowest  concentration of SARS-CoV-2 viral copies this assay can detect is 250  copies / mL. A negative result does not preclude SARS-CoV-2 infection  and should not be used as the sole basis for treatment or other  patient management decisions.  A negative result may occur with  improper specimen collection / handling, submission of specimen other  than nasopharyngeal swab, presence of viral mutation(s) within the  areas targeted by this assay, and inadequate number of viral copies  (<250 copies / mL). A negative result must be combined with clinical  observations, patient history, and epidemiological information. If result is POSITIVE SARS-CoV-2 target nucleic acids are DETECTED. The SARS-CoV-2 RNA is generally detectable in upper and lower  respiratory specimens dur ing the acute phase of infection.  Positive  results are indicative of active infection with SARS-CoV-2.  Clinical  correlation with patient history and other diagnostic information is  necessary to determine patient infection status.  Positive results do  not rule out bacterial infection or co-infection with other viruses. If result is PRESUMPTIVE POSTIVE SARS-CoV-2 nucleic acids MAY BE PRESENT.   A presumptive positive result was obtained on the  submitted specimen  and confirmed on repeat testing.  While 2019 novel coronavirus  (SARS-CoV-2) nucleic acids may be present in the submitted sample  additional confirmatory testing may be necessary for epidemiological  and / or clinical management  purposes  to differentiate between  SARS-CoV-2 and other Sarbecovirus currently known to infect humans.  If clinically indicated additional testing with an alternate test  methodology [phone]) is advised. The SARS-CoV-2 RNA is generally  detectable in upper and lower respiratory sp ecimens during the acute  phase of infection. The expected result is Negative. Fact Sheet for Patients:  StrictlyIdeas.no Fact Sheet for Healthcare Providers: BankingDealers.co.za This test is not yet approved or cleared by the [place] FDA and has been authorized for detection and/or diagnosis of SARS-CoV-2 by FDA under an Emergency Use Authorization (EUA).  This EUA will remain in effect (meaning this test can be used) for the duration of the COVID-19 declaration under Section 564(b)(1) of the Act, 21 U.S.C. section 360bbb-3(b)(1), unless the authorization is terminated or revoked sooner. Performed at [facility], [place] [address] [place], [place] [address]   Type and screen [place]     Status: None (Preliminary result)   Collection Time: 10/26/18  8:49 PM  Result Value Ref Range   ABO/RH(D) O POS    Antibody Screen NEG    Sample Expiration 10/29/2018,2359    Unit Number [id number]    Blood Component Type RBC, LR IRR    Unit division 00    Status of Unit ISSUED,FINAL    Transfusion Status OK TO TRANSFUSE    Crossmatch Result      Compatible Performed at [facility], [place] [address], [place], [place] [address]    Unit Number [id number]    Blood Component Type RED CELLS,LR    Unit division 00    Status of Unit ISSUED    Transfusion Status OK TO TRANSFUSE     Crossmatch Result Compatible   ABO/Rh     Status: None   Collection Time: 10/26/18  8:49 PM  Result Value Ref Range   ABO/RH(D) O POS    No rh immune globuloin      NOT A RH IMMUNE GLOBULIN CANDIDATE, PT RH POSITIVE Performed at [facility] [address], [place], [place] [address]   POC occult blood, ED Provider will collect     Status: Abnormal   Collection Time: 10/26/18  9:15 PM  Result Value Ref Range   Fecal Occult Bld POSITIVE (A) NEGATIVE  Vitamin B12     Status: None   Collection Time: 10/26/18  9:46 PM  Result Value Ref Range   Vitamin B-12 767 180 - 914 pg/mL    Comment: (NOTE) This assay is not validated for testing neonatal or myeloproliferative syndrome specimens for Vitamin B12 levels. Performed at [facility], [place] [address] [place], [place] [address]   Folate     Status: None   Collection Time: 10/26/18  9:46 PM  Result Value Ref Range   Folate 9.5 >5.9 ng/mL    Comment: Performed at [facility], [place] [address], [place], [place] [address]  Iron and TIBC     Status: Abnormal   Collection Time: 10/26/18  9:46 PM  Result Value Ref Range   Iron 137 28 - 170 ug/dL   TIBC 384 250 - 450 ug/dL   Saturation Ratios 36 (H) 10.4 - 31.8 %   UIBC 247 ug/dL    Comment: Performed at [facility], [place] [address], [place], [place] [address]  Ferritin     Status: Abnormal   Collection Time: 10/26/18  9:46 PM  Result Value Ref Range  Ferritin 797 (H) 11 - 307 ng/mL    Comment: Performed at [facility], [place] [address], [place], [place] [address]  Reticulocytes     Status: Abnormal   Collection Time: 10/26/18  9:46 PM  Result Value Ref Range   Retic Ct Pct 6.1 (H) 0.4 - 3.1 %   RBC. 1.28 (L) 3.87 - 5.11 MIL/uL   Retic Count, Absolute 78.5 19.0 - 186.0 K/uL   Immature Retic Fract 42.5 (H) 2.3 - 15.9 %    Comment: Performed at [facility] [address] [place], [place] [address]  Prepare RBC     Status: None   Collection Time:  10/26/18 10:16 PM  Result Value Ref Range   Order Confirmation      ORDER PROCESSED BY BLOOD BANK Performed at [facility], [place] [address], [place], [place] [address]   Magnesium     Status: None   Collection Time: 10/27/18 12:16 AM  Result Value Ref Range   Magnesium 2.1 1.7 - 2.4 mg/dL    Comment: Performed at [facility] [address], [place], [place] [address]  Hepatic function panel     Status: Abnormal   Collection Time: 10/27/18 12:16 AM  Result Value Ref Range   Total Protein 6.0 (L) 6.5 - 8.1 g/dL   Albumin 2.7 (L) 3.5 - 5.0 g/dL   AST 41 15 - 41 U/L   ALT 25 0 - 44 U/L   Alkaline Phosphatase 379 (H) 38 - 126 U/L   Total Bilirubin 1.2 0.3 - 1.2 mg/dL   Bilirubin, Direct 0.3 (H) 0.0 - 0.2 mg/dL   Indirect Bilirubin 0.9 0.3 - 0.9 mg/dL    Comment: Performed at [facility] [address] [place], [place] [address]  CBC     Status: Abnormal   Collection Time: 10/27/18  8:59 AM  Result Value Ref Range   WBC 7.4 4.0 - 10.5 K/uL   RBC 2.26 (L) 3.87 - 5.11 MIL/uL   Hemoglobin 7.3 (L) 12.0 - 15.0 g/dL    Comment: REPEATED TO VERIFY POST TRANSFUSION SPECIMEN    HCT 22.5 (L) 36.0 - 46.0 %   MCV 99.6 80.0 - 100.0 fL    Comment: POST TRANSFUSION SPECIMEN REPEATED TO VERIFY    MCH 32.3 26.0 - 34.0 pg   MCHC 32.4 30.0 - 36.0 g/dL   RDW 23.6 (H) 11.5 - 15.5 %   Platelets 97 (L) 150 - 400 K/uL    Comment: REPEATED TO VERIFY PLATELET COUNT CONFIRMED BY SMEAR SPECIMEN CHECKED FOR CLOTS Immature Platelet Fraction may be clinically indicated, consider ordering this additional test [id number]    nRBC 3.6 (H) 0.0 - 0.2 %    Comment: Performed at [facility], [place] [address], [place], [place] [address]  Protime-INR     Status: None   Collection Time: 10/27/18  8:59 AM  Result Value Ref Range   Prothrombin Time 14.3 11.4 - 15.2 seconds   INR 1.1 0.8 - 1.2    Comment: (NOTE) INR goal varies based on device and disease states. Performed at [facility], [place] [address], [place], [place] [address]   TSH     Status: None   Collection Time: 10/27/18  8:59 AM  Result Value Ref Range   TSH 3.557 0.350 - 4.500 uIU/mL    Comment: Performed by a 3rd Generation assay with a functional sensitivity of <=0.01 uIU/mL. Performed at  [facility], [place] [address] [place], [place] [address]   Hemoglobin A1c     Status: None   Collection Time: 10/27/18  8:59 AM  Result Value Ref Range   Hgb A1c MFr Bld 5.3 4.8 - 5.6 %    Comment: (NOTE) Pre diabetes:          5.7%-6.4% Diabetes:              >6.4% Glycemic control for   <7.0% adults with diabetes    Mean Plasma Glucose 105.41 mg/dL    Comment: Performed at [facility] [address] [place], [place] [address]  Comprehensive metabolic panel     Status: Abnormal   Collection Time: 10/27/18  8:59 AM  Result Value Ref Range   Sodium 141 135 - 145 mmol/L   Potassium 3.6 3.5 - 5.1 mmol/L   Chloride 102 98 - 111 mmol/L   CO2 28 22 - 32 mmol/L   Glucose, Bld 92 70 - 99 mg/dL   BUN 17 6 - 20 mg/dL   Creatinine, Ser 1.04 (H) 0.44 - 1.00 mg/dL   Calcium 9.9 8.9 - 10.3 mg/dL   Total Protein 6.1 (L) 6.5 - 8.1 g/dL   Albumin 2.8 (L) 3.5 - 5.0 g/dL   AST 38 15 - 41 U/L   ALT 24 0 - 44 U/L   Alkaline Phosphatase 367 (H) 38 - 126 U/L   Total Bilirubin 1.9 (H) 0.3 - 1.2 mg/dL   GFR calc non Af Amer 59 (L) >60 mL/min   GFR calc Af Amer >60 >60 mL/min   Anion gap 11 5 - 15    Comment: Performed at [facility] [address] [place], [place] [address]  Lactate dehydrogenase     Status: Abnormal   Collection Time: 10/27/18  8:59 AM  Result Value Ref Range   LDH 312 (H) 98 - 192 U/L    Comment: Performed at [facility], [place] [address], [place],  [address]  Lipid panel     Status: Abnormal   Collection Time: 10/27/18  8:59 AM  Result Value Ref Range   Cholesterol 111 0 - 200 mg/dL   Triglycerides 114 <150 mg/dL   HDL 24 (L) >40 mg/dL   Total CHOL/HDL  Ratio 4.6 RATIO   VLDL 23 0 - 40 mg/dL   LDL Cholesterol 64 0 - 99 mg/dL    Comment:        Total Cholesterol/HDL:CHD Risk Coronary Heart Disease Risk Table                     Men   Women  1/2 Average Risk   3.4   3.3  Average Risk       5.0   4.4  2 X Average Risk   9.6   7.1  3 X Average Risk  23.4   11.0        Use the calculated Patient Ratio above and the CHD Risk Table to determine the patient's CHD Risk.        ATP III CLASSIFICATION (LDL):  <100     mg/dL   Optimal  100-129  mg/dL   Near or Above                    Optimal  130-159  mg/dL   Borderline  160-189  mg/dL   High  >190     mg/dL  Very High Performed at [facility], [place] [address], [place], [place] [address]   Cortisol     Status: None   Collection Time: 10/27/18  8:59 AM  Result Value Ref Range   Cortisol, Plasma 16.3 ug/dL    Comment: (NOTE) AM    6.7 - 22.6 ug/dL PM   <10.0       ug/dL Performed at [facility] [address] [place], [place] [address]     Ct Chest W Contrast  Result Date: 10/26/2018 CLINICAL DATA:  Shortness of breath, possible metastatic disease. Breast cancer. EXAM: CT CHEST WITH CONTRAST TECHNIQUE: Multidetector CT imaging of the chest was performed during intravenous contrast administration. CONTRAST:  27mL OMNIPAQUE IOHEXOL 300 MG/ML  SOLN COMPARISON:  Chest x-ray earlier today FINDINGS: Cardiovascular: Mild cardiomegaly. Moderate coronary artery calcifications in the left anterior descending coronary artery. Aorta is normal caliber. Scattered atherosclerotic change. Mediastinum/Nodes: Numerous borderline sized and mildly enlarged mediastinal lymph nodes. Right paratracheal node measures 12 mm. Borderline size bilateral hilar lymph nodes, the largest on the right measuring 11 mm in short axis diameter. Numerous enlarged bilateral axillary lymph nodes with index left axillary lymph node having a short axis diameter of 2.1 cm on image 21 and right axillary lymph node  having a short axis diameter of 1.4 cm on image 48. Lungs/Pleura: Moderate bilateral pleural effusions. Extensive interstitial disease throughout the lungs bilaterally. Mild reticulonodular densities. Cannot exclude lymphangitic spread of tumor. Upper Abdomen: Gallstones noted within the gallbladder. Musculoskeletal: Chest wall soft tissues are unremarkable. Extensive diffuse mixed lytic and sclerotic lesions throughout the visualized skeleton compatible with diffuse skeletal metastases. IMPRESSION: Bilateral axillary and mediastinal adenopathy. Borderline hilar lymph nodes bilaterally. Findings likely reflect metastatic lymphadenopathy. Moderate bilateral pleural effusions. Extensive interstitial thickening and reticulonodular opacities throughout the lungs. Cannot exclude lymphangitic spread of tumor. Diffuse skeletal metastases. Cholelithiasis. Coronary artery disease. Aortic Atherosclerosis (ICD10-I70.0). Electronically Signed   By: Rolm Baptise M.D.   On: 10/26/2018 22:54   Dg Chest Portable 1 View  Result Date: 10/26/2018 CLINICAL DATA:  Shortness of breath, possible right breast cancer EXAM: PORTABLE CHEST 1 VIEW COMPARISON:  None. FINDINGS: Low lung volumes. No pleural effusion. Diffuse interstitial opacity. Heart size upper limits of normal. No pneumothorax. Diffuse sclerosis and lucent lesions within the clavicles, ribs and shoulders. IMPRESSION: 1. Low lung volumes without consolidation or effusion. Diffuse bilateral interstitial opacity of unknown chronicity. Findings could be secondary to interstitial inflammatory process, atypical/viral pneumonia, or possible metastatic disease given clinical history. CT chest suggested for further evaluation 2. 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   On: 10/26/2018 20:11    ROS negative except above Blood pressure (!)  144/86, pulse 100, temperature 98.8 F (37.1 C), temperature source Oral, resp. rate 18, height 5\' 3"  (1.6 m), weight 103 kg, SpO2 98 %. Physical Exam vital signs stable afebrile no acute distress patient sitting on side of bed comfortably abdomen is soft nontender she is anemic with a low platelet count but not iron deficient and she was guaiac positive and chest CT did not show any obvious liver mets but she did have some gallstones  Assessment/Plan: Not iron deficient anemic and guaiac positive questionable etiology Plan: Would let oncology finish their work-up and if they feel she needs a colonoscopy or endoscopy I am happy to proceed however if a PET scan is planned  could wait on that to see if any significant GI concerns and please let me know if I can help during this hospital stay otherwise will wait on above and will allow clear liquids at this time but if no inpatient GI work-up expected then may advance diet further  [facility] 10/27/2018, 1:56 PM

## 2018-10-27 NOTE — Progress Notes (Signed)
Advance directive given to patient by day RN during shift change.   Patient complained of new onset nausea.Paged family medicine at 2312 and 2323 for PRN nausea mead. Charge RN follow up about med order at 2349.   After administering nausea medication, informed patient will be going to CT. Patient became tearful and began sobbing stating not able to go to CT because could not breathe during prior CT Stated did not want to go through that trauam again. Patient inquired if familiar with Angelena Sole and stated had feeling of not being able to breathe. Acknowledged while not similar to [place], it was poor timing given recent events. Comforted patient and stated will inform provider not able to do CT at this time and will inform day team. Offered to assist patient with getting in recliner to sleep since she would be more upright; patient agreed. Notified CT at [place]. Paged [place], spoke with [facility] via phone at [place]: informed upset, discomfort with lying flat, attempt to sit up in chair to sleep, and encouraged to further discuss CT with patient in AM after patient slept/rested.   Spoke with MD [facility] via phone at [phone]. Informed patient stated felt a "crackle" in right clavicle/shoulder with movement to push self up in recliner. Right clavicle raised to palpation. 7-8/10 pain with movement; no pain when still. Full sensation, able to move arm. Asked MD for PRN anxiety medication given distress, discomfort and anxiety. Informed patient of possible chest xray at bedside. Patient stated she would like something for anxiety upon inquiry. Patient apologized for being such a burden; comforted patient and encouraged to express needs. PRN Xanax and Tylenol given. Notified MD at 0208 xray complete; reading xray results and inquired of results so can ambulate and assist patient back to bed appropriately. Informed by MD Tarry Kos to minimize motion of arm, prop up in bed, control pain (Toradol IV), ice throughout night and.  Informed no obvious fracture; possible medial. Informed ortho to be consulted during day.   Patient initially declined Toradol,an other RN assist patient repositioning in chair. Informed patient no obvious fracture, but care team and ortho to see patient in the AM. Inquired again at 0346 about pain med, administered med prior to assisting patient back to bed. Gave ordered Toradol and assisted patient back to bed; declined ice pack and elevated arm.

## 2018-10-27 NOTE — Progress Notes (Signed)
OT Cancellation Note  Patient Details Name: Suzanne [name] MRN: [id number] DOB: 06/19/1960   Cancelled Treatment:    Reason Eval/Treat Not Completed: Patient at procedure or test/ unavailable. Pt currently in nuclear med for scan.   [place], OTR/L Acute Rehab Services Pager [phone] Office [phone]     Almon Register 10/27/2018, 2:21 PM

## 2018-10-27 NOTE — Progress Notes (Signed)
Patient arrived to unit around 01:30 am. Patient alert and oriented x4, very short of breath. Moisture and redness under breast and abdominal fold. Patient denied pain and just stated need to be sit up in bed to breath but she is just ok. Bil. Extremities Edema non pitting.

## 2018-10-27 NOTE — Progress Notes (Addendum)
Family Medicine Teaching Service Daily Progress Note Intern Pager: [phone]  Patient name: Suzanne [name] Medical record number: [id number] Date of birth: [birth year] Age: 58 y.o. Gender: female  Primary Care Provider: Patient, No Pcp Per Consultants: None Code Status: Full  Pt Overview and Major Events to Date:  5/31 Admitted to FPTS  Assessment and Plan: Suzanne [name] is a 58 y.o. female presenting with worsening SOB and found to have Hgb 4.5. No significant PMH.   Dyspnea new onset. Likely multifactorial from symptomatic anemia and newly discovered possible metastatic disease Progressive shortness of breath with exertion and at rest for 1 month.  Likely secondary to anemia and possible malignant pleural effusions seen on CT chest.  Patient received 2 units packed red blood cells on 5/31. All labs this AM pending and no post-transfusion H&H in chart.  Respiratory rate stable at 18 this a.m.,  Improved from 26 on admission. - admit to med-tele, FPTS, attending Dr. Mingo Amber - s/p 2U pRBC in ED - follow up post-transfusion H&H - continuous cardiac monitor, pulse ox - Echo scheduled - consider d-dimer/CTA if no improvement in vitals after blood transfusion - Consult Heme/onc: will see patient, requested CA 27.29, Bone scan, breast prognostic panel per IR with thoracentesis - consult IR - diagnostic thoracentesis - f/u AM CBC, CMP, A1c, Lipids, PT/INR, TSH, LDH, ACTH - PT/OT eval - consider palliative consult if indicated  Acute Severe Anemia: Hgb on admission 4.5. MCV 115.3. Retic 6.1. Ferritin elevated to 797. Iron, TIBC, Folate and B12 all WNL. No history of bleeding and no signs of blood loss on exam, however FOBT positive.  All labs this a.m. pending.  Tachycardic to 110s overnight.  EKG this a.m. pending.  BP has remained stable at 144/86. - S/p 2 units pRBC in ED - follow up post-transfusion H&H - transfusion threshold <7 - consult GI, no hx colonoscopy  - NPO pending  further work up - SCDs given possible acute bleed  Acute Right Breast Changes  Concern for new metastatic disease of undetermined primary: New onset areolar thickening (see media) with intermittent bloody discharge and inversion of nipple and CT positive for extensive interstitial thickening and reticulonodular opacities throughout the lungs with bilateral axillary, mediastinal, and hilar lymphadenopathy, as well as extensive diffuse mixed lytic and sclerotic lesions throughout visualized skeleton compatible with diffuse skeletal metastases. Reported weight loss and anorexia. Concern for inflammatory breast cancer. Family history of late age breast cancer in sister. - follow up labs as above - consult heme/onc in AM  Hypokalemia: K 2.9 on admission, Mag 2.1. Unclear etiology other than poor PO intake. Given diffuse skeletal findings on CT, possibly 2/2 to new malignancy of unknown primary. K pending this AM. - s/p 40 mEq IV k+ - f/u AM labs - follow up labs above - replace as needed  Elevated Alkaline Phosphatase: Cholelithiasis noted on CT. Denies any RUQ pain, although currently experiencing right breast pain that may be shielding symptoms. Denies any nausea or vomiting. AST/ALT and bilirubin levels WNL.  - follow up LDH levels - consider further imaging if indicated  New onset LE edema  Bilateral Pleural effusion Exam with trace to 1+ pitting exam on exam and bilateral pleural effusions on imaging. No echo in chart review.  - follow up echo  Inframammary Candidal Intertrigo: Severe intertrigo along bilateral inframammary folds.  - Clotrimazole BID x 2-4 weeks  Atherosclerosis/CAD: Aortic atherosclerosis and moderate coronary artery calcifications in the left anterior descending coronary artery on CT  scan. EKG with sinus tachy without ST changes. Troponin negative on admission. Denies any chest pain. No known history of CAD. - obtain risk stratifying labs: A1C, lipids - AM  EKG pending - follow up Echo  FEN/GI: NPO pending thoracentesis PPx: SCDs given anemia  Disposition: pending PT/OT, clinical improvement  Subjective:  Patient continues to have SOB, but notes somewhat improved.  Endorses orthopnea.  Denies other complaints.  Objective: Temp:  [98.4 F (36.9 C)-99.9 F (37.7 C)] 98.8 F (37.1 C) (06/01 0433) Pulse Rate:  [100-115] 100 (06/01 0433) Resp:  [18-29] 18 (06/01 0433) BP: (125-173)/(65-92) 144/86 (06/01 0433) SpO2:  [93 %-100 %] 98 % (06/01 0433) Weight:  [798 kg] 103 kg (06/01 0118)  Physical Exam:  General: 58 y.o. female in NAD Cardio: RRR no m/r/g Lungs: Decreased breath sounds throughout, no IWOB on 3L Abdomen: Soft, non-tender to palpation, non-distended, positive bowel sounds Skin: warm and dry Extremities: 1+ pitting BLE edema   Laboratory: Recent Labs  Lab 10/26/18 1921  WBC 6.8  HGB 4.7*  HCT 15.8*  PLT PLATELET CLUMPS NOTED ON SMEAR, COUNT APPEARS DECREASED   Recent Labs  Lab 10/26/18 1921 10/27/18 0016  NA 141  --   K 2.9*  --   CL 102  --   CO2 24  --   BUN 21*  --   CREATININE 0.95  --   CALCIUM 9.8  --   PROT  --  6.0*  BILITOT  --  1.2  ALKPHOS  --  379*  ALT  --  25  AST  --  41  GLUCOSE 102*  --     Imaging/Diagnostic Tests: Ct Chest W Contrast  Result Date: 10/26/2018 CLINICAL DATA:  Shortness of breath, possible metastatic disease. Breast cancer. EXAM: CT CHEST WITH CONTRAST TECHNIQUE: Multidetector CT imaging of the chest was performed during intravenous contrast administration. CONTRAST:  103mL OMNIPAQUE IOHEXOL 300 MG/ML  SOLN COMPARISON:  Chest x-ray earlier today FINDINGS: Cardiovascular: Mild cardiomegaly. Moderate coronary artery calcifications in the left anterior descending coronary artery. Aorta is normal caliber. Scattered atherosclerotic change. Mediastinum/Nodes: Numerous borderline sized and mildly enlarged mediastinal lymph nodes. Right paratracheal node measures 12 mm.  Borderline size bilateral hilar lymph nodes, the largest on the right measuring 11 mm in short axis diameter. Numerous enlarged bilateral axillary lymph nodes with index left axillary lymph node having a short axis diameter of 2.1 cm on image 21 and right axillary lymph node having a short axis diameter of 1.4 cm on image 48. Lungs/Pleura: Moderate bilateral pleural effusions. Extensive interstitial disease throughout the lungs bilaterally. Mild reticulonodular densities. Cannot exclude lymphangitic spread of tumor. Upper Abdomen: Gallstones noted within the gallbladder. Musculoskeletal: Chest wall soft tissues are unremarkable. Extensive diffuse mixed lytic and sclerotic lesions throughout the visualized skeleton compatible with diffuse skeletal metastases. IMPRESSION: Bilateral axillary and mediastinal adenopathy. Borderline hilar lymph nodes bilaterally. Findings likely reflect metastatic lymphadenopathy. Moderate bilateral pleural effusions. Extensive interstitial thickening and reticulonodular opacities throughout the lungs. Cannot exclude lymphangitic spread of tumor. Diffuse skeletal metastases. Cholelithiasis. Coronary artery disease. Aortic Atherosclerosis (ICD10-I70.0). Electronically Signed   By: Rolm Baptise M.D.   On: 10/26/2018 22:54   Dg Chest Portable 1 View  Result Date: 10/26/2018 CLINICAL DATA:  Shortness of breath, possible right breast cancer EXAM: PORTABLE CHEST 1 VIEW COMPARISON:  None. FINDINGS: Low lung volumes. No pleural effusion. Diffuse interstitial opacity. Heart size upper limits of normal. No pneumothorax. Diffuse sclerosis and lucent lesions within the clavicles, ribs and  shoulders. IMPRESSION: 1. Low lung volumes without consolidation or effusion. Diffuse bilateral interstitial opacity of unknown chronicity. Findings could be secondary to interstitial inflammatory process, atypical/viral pneumonia, or possible metastatic disease given clinical history. CT chest suggested for  further evaluation 2. 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   On: 10/26/2018 20:11    Meccariello, Bernita Raisin, DO 10/27/2018, 9:48 AM PGY-1, [place] Intern pager: [phone], text pages welcome

## 2018-10-27 NOTE — Progress Notes (Signed)
  Echocardiogram 2D Echocardiogram has been performed.  Suzanne [name] L Androw 10/27/2018, 11:15 AM

## 2018-10-28 ENCOUNTER — Inpatient Hospital Stay (HOSPITAL_COMMUNITY): Payer: Medicaid Other

## 2018-10-28 ENCOUNTER — Telehealth: Payer: Self-pay | Admitting: Oncology

## 2018-10-28 ENCOUNTER — Encounter (HOSPITAL_COMMUNITY): Payer: Self-pay | Admitting: Student

## 2018-10-28 DIAGNOSIS — E876 Hypokalemia: Secondary | ICD-10-CM

## 2018-10-28 DIAGNOSIS — J90 Pleural effusion, not elsewhere classified: Secondary | ICD-10-CM

## 2018-10-28 HISTORY — PX: IR THORACENTESIS ASP PLEURAL SPACE W/IMG GUIDE: IMG5380

## 2018-10-28 LAB — TYPE AND SCREEN
ABO/RH(D): O POS
Antibody Screen: NEGATIVE
Unit division: 0
Unit division: 0

## 2018-10-28 LAB — BODY FLUID CELL COUNT WITH DIFFERENTIAL
Eos, Fluid: 1 %
Lymphs, Fluid: 38 %
Monocyte-Macrophage-Serous Fluid: 46 % — ABNORMAL LOW (ref 50–90)
Neutrophil Count, Fluid: 15 % (ref 0–25)
Total Nucleated Cell Count, Fluid: 280 cu mm (ref 0–1000)

## 2018-10-28 LAB — BASIC METABOLIC PANEL
Anion gap: 13 (ref 5–15)
BUN: 17 mg/dL (ref 6–20)
CO2: 25 mmol/L (ref 22–32)
Calcium: 10.3 mg/dL (ref 8.9–10.3)
Chloride: 101 mmol/L (ref 98–111)
Creatinine, Ser: 0.88 mg/dL (ref 0.44–1.00)
GFR calc Af Amer: >60 mL/min (ref >60)
GFR calc non Af Amer: >60 mL/min (ref >60)
Glucose, Bld: 100 mg/dL — ABNORMAL HIGH (ref 70–99)
Potassium: 3.3 mmol/L — ABNORMAL LOW (ref 3.5–5.1)
Sodium: 139 mmol/L (ref 135–145)

## 2018-10-28 LAB — PROTEIN, PLEURAL OR PERITONEAL FLUID: Total protein, fluid: <6 g/dL

## 2018-10-28 LAB — ACTH: C206 ACTH: 5.9 pg/mL — ABNORMAL LOW (ref 7.2–63.3)

## 2018-10-28 LAB — BPAM RBC
Blood Product Expiration Date: 202006102359
Blood Product Expiration Date: 202006212359
ISSUE DATE / TIME: 202005312248
ISSUE DATE / TIME: 202006010200
Unit Type and Rh: 5100
Unit Type and Rh: 5100

## 2018-10-28 LAB — URINALYSIS, ROUTINE W REFLEX MICROSCOPIC
Bilirubin Urine: NEGATIVE
Glucose, UA: NEGATIVE mg/dL
Hgb urine dipstick: NEGATIVE
Ketones, ur: 5 mg/dL — AB
Leukocytes,Ua: NEGATIVE
Nitrite: NEGATIVE
Protein, ur: NEGATIVE mg/dL
Specific Gravity, Urine: 1.038 — ABNORMAL HIGH (ref 1.005–1.030)
pH: 5 (ref 5.0–8.0)

## 2018-10-28 LAB — CBC
HCT: 23.5 % — ABNORMAL LOW (ref 36.0–46.0)
Hemoglobin: 7.7 g/dL — ABNORMAL LOW (ref 12.0–15.0)
MCH: 32.4 pg (ref 26.0–34.0)
MCHC: 32.8 g/dL (ref 30.0–36.0)
MCV: 98.7 fL (ref 80.0–100.0)
Platelets: 97 10*3/uL — ABNORMAL LOW (ref 150–400)
RBC: 2.38 MIL/uL — ABNORMAL LOW (ref 3.87–5.11)
RDW: 23.4 % — ABNORMAL HIGH (ref 11.5–15.5)
WBC: 6.7 10*3/uL (ref 4.0–10.5)
nRBC: 3.3 % — ABNORMAL HIGH (ref 0.0–0.2)

## 2018-10-28 LAB — LACTATE DEHYDROGENASE, PLEURAL OR PERITONEAL FLUID: LD, Fluid: 93 U/L — ABNORMAL HIGH (ref 3–23)

## 2018-10-28 LAB — GRAM STAIN

## 2018-10-28 MED ORDER — LIDOCAINE HCL (PF) 1 % IJ SOLN
INTRAMUSCULAR | Status: DC | PRN
  Administered 2018-10-28 – 2018-10-29 (×2): 10 mL

## 2018-10-28 MED ORDER — KETOROLAC TROMETHAMINE 30 MG/ML IJ SOLN
30.0000 mg | Freq: Once | INTRAMUSCULAR | Status: AC
  Administered 2018-10-28: 30 mg via INTRAVENOUS
  Filled 2018-10-28: qty 1

## 2018-10-28 MED ORDER — ALPRAZOLAM 0.25 MG PO TABS
0.2500 mg | ORAL_TABLET | Freq: Once | ORAL | Status: AC
  Administered 2018-10-28: 0.25 mg via ORAL
  Filled 2018-10-28: qty 1

## 2018-10-28 MED ORDER — LIDOCAINE HCL 1 % IJ SOLN
INTRAMUSCULAR | Status: AC
  Filled 2018-10-28: qty 20

## 2018-10-28 MED ORDER — OXYCODONE HCL 5 MG PO TABS
2.5000 mg | ORAL_TABLET | ORAL | Status: DC | PRN
  Administered 2018-10-28 – 2018-10-29 (×7): 2.5 mg via ORAL
  Filled 2018-10-28 (×7): qty 1

## 2018-10-28 MED ORDER — POTASSIUM CHLORIDE CRYS ER 20 MEQ PO TBCR
40.0000 meq | EXTENDED_RELEASE_TABLET | Freq: Once | ORAL | Status: AC
  Administered 2018-10-28: 40 meq via ORAL
  Filled 2018-10-28: qty 2

## 2018-10-28 NOTE — Progress Notes (Signed)
Patient being transported to IR for thoracentesis at this time.

## 2018-10-28 NOTE — Progress Notes (Signed)
FPTS Interim Progress Note  Received page from nurse stating patient is hysterical with high anxiety about going down for CT scan. She notes she "just cant go through the trauma again". Nurse is requesting we postpone CT scan to tomorrow morning to allow patient to calm down, get some rest and allow for further discussion of importance of exam. Per nurse, it is very difficult for patient to lay flat and cause severe shortness of breath. Agreed to postpone CT at this time. Will have day team discuss CT scan further with patient in the AM. Can consider dose of Xanax prior to help with anxiety, however caution due to SOB.   Danna Hefty, DO 10/28/2018, 12:29 AM PGY-1, [place] Medicine Service pager [phone]

## 2018-10-28 NOTE — Telephone Encounter (Signed)
Left message re 6/5 appointment. Not able to reach patient or leave message at secondary number - answered by young person - patient not available.

## 2018-10-28 NOTE — Progress Notes (Addendum)
FPTS Interim Progress Note  S: Received page from nurse stating patient had rolled over to her right side and heard a crack along her right clavicle.  Went to assess patient.  Obvious bulge at medial aspect of right clavicle tender to palpation. Skin intact, no signs of ecchymosis or hematoma.  Unable to assess active or passive range of motion due to pain.  2+ radial pulses.  Patient breathing comfortably on oxygen with no change in oxygen requirement. Notes minimal pain when she does not move. Patient is very tearful and anxious   O: BP (!) 151/71 (BP Location: Left Arm)   Pulse 91   Temp 98.5 F (36.9 C)   Resp 18   Ht 5\' 3"  (1.6 m)   Wt 103 kg   SpO2 97%   BMI 40.22 kg/m   Gen: sitting up on bedside chair Lungs: breathing comfortably without increased WOB on oxygen MSK: step off appreciated at distal third of clavicle, tender to palpation, no ecchymosis, bleeding, or hematoma, skin intact.   A/P: - STAT 1-view CXR - Xanax 0.25mg  x 1 for anxiety for procedure - Tylenol 650mg  for pain - ice to shoulder  Danna Hefty, DO 10/28/2018, 1:21 AM PGY-1, [place] Medicine Service pager [phone]

## 2018-10-28 NOTE — Progress Notes (Signed)
Discussed the head CT that is ordered and if patient would like to proceed. Per patient, she would like to wait until after thoracentesis today for CT. IR does not have a scheduled time for patient to come down today; CT made aware. Will continue to monitor.   Hiram Comber, RN 10/28/2018 11:22 AM

## 2018-10-28 NOTE — Progress Notes (Signed)
Patient back from IR. Per patient, her respiratory status seems improved and breathing is easier. 98% saturation on 2L [place]. Patient has hardly gotten rest the past two nights due to inability to tolerate laying in bed. Per patient, she wishes to be left alone. Patient remains tearful and withdrawn throughout interaction. Will continue to monitor.    Hiram Comber, RN 10/28/2018 4:42 PM

## 2018-10-28 NOTE — Procedures (Signed)
PROCEDURE SUMMARY:  Successful image-guided right thoracentesis. Yielded 600 milliliters of clear gold fluid. Patient tolerated procedure well. No immediate complications. EBL = 0 mL.  Specimen was sent for labs. CXR ordered.  Earley Abide PA-C 10/28/2018 3:40 PM

## 2018-10-28 NOTE — Evaluation (Signed)
Occupational Therapy Evaluation Patient Details Name: Suzanne [name] MRN: [id number] DOB: 09/07/1960 Today's Date: 10/28/2018    History of Present Illness pt admitted 10/26/18 with Progressively worsening SOB with both exertion and at rest for 1 month. Etiology is likely multifactorial including acute severe anemia of 4.5 on admission and concern for new metastatic disease noted on CT and CXR with extensive interstitial thickening and reticulonodular lung opacities bilaterally, moderate bilateral pleural effusions, and numerous mediastinal, hilar, and axillary lymphnodes. Pleural effusions likely 2/2 to metastatic disease but given no prior echo and new bilateral LE edema heart failure exacerbation is possible, however BNP of 48.6 is reassuring. Well's Score 1.5 for tachycardia, 2.5 if counting possible malignancy. Unlikely infection given afebrile, mild cough, and lack of leukocytosis. EKG with sinus tachycardia and no known history of arrhythmia.   Clinical Impression   Patient seated on EOB upon therapy arrival and agreeable to participate in OT evaluation. Generally, patient appears to be at baseline or close to it for basic ADL tasks. Patient does report a new onset of Left shoulder pain and decreased ROM although she has recently received pain medication prior to therapy arrival and is demonstrating A/ROM [facility]. Although when strength was assessed with LUE, patient presents with decreased strength. At this time, patient has a possible Right clavicle fracture and per MD note should stabilize and refrain from any ROM. Patient does not need any OT services acutely as education was provided regarding RUE precautions and monitoring/follow up with MD regarding Left shoulder pain. I do recommend that patient either receive Home Health OT or if she has the transportation Outpatient OT for her BUE when medically appropriate to focus on strength and ROM. Patient will also benefit from shower chair due to her SOB  and fatigue during functional tasks.    Follow Up Recommendations  Home health OT;Outpatient OT    Equipment Recommendations  Tub/shower seat       Precautions / Restrictions Precautions Precautions: Fall;Shoulder Type of Shoulder Precautions: possible right clavicle fx. Per MD noted stabilize RUE and refrain from ROM. Possible left side rib fracture Precaution Booklet Issued: No Precaution Comments: Verbally educated patient on MD's recommendations for RUE.  Restrictions Weight Bearing Restrictions: No      Mobility  Transfers Overall transfer level: Modified independent Equipment used: None Transfers: Sit to/from Stand Sit to Stand: Modified independent (Device/Increase time)                  ADL either performed or assessed with clinical judgement   ADL Overall ADL's : Needs assistance/impaired Eating/Feeding: Independent;Sitting   Grooming: Wash/dry hands;Wash/dry face;Oral care;Standing;Modified independent   Upper Body Bathing: Minimal assistance Upper Body Bathing Details (indicate cue type and reason): due to right UE precautions and possible clavicle fracture Lower Body Bathing: Minimal assistance;Sit to/from stand;Sitting/lateral leans Lower Body Bathing Details (indicate cue type and reason): due to right UE precautions and possible clavicle fracture Upper Body Dressing : Minimal assistance;Sitting Upper Body Dressing Details (indicate cue type and reason): due to right UE precautions and possible clavicle fracture Lower Body Dressing: Minimal assistance;Sit to/from stand;Sitting/lateral leans Lower Body Dressing Details (indicate cue type and reason): due to right UE precautions and possible clavicle fracture Toilet Transfer: Modified Independent;Regular Toilet;Grab bars;Ambulation          Vision Baseline Vision/History: No visual deficits Patient Visual Report: No change from baseline              Pertinent Vitals/Pain Pain Assessment:  Faces Faces  Pain Scale: Hurts even more Pain Location: right shoulder - with small movement or holding grab bar to stand from toilet. Pain Descriptors / Indicators: Aching Pain Intervention(s): Monitored during session;Limited activity within patient's tolerance     Hand Dominance Right   Extremity/Trunk Assessment Upper Extremity Assessment Upper Extremity Assessment: RUE deficits/detail;LUE deficits/detail RUE Deficits / Details: Per MD note: possible Right clavicle fracture. Stabilize and refrain from ROM at this time. functional gross grasp.  RUE: Unable to fully assess due to immobilization LUE Deficits / Details: With pain medication: Patient presents with shoulder A/ROM [facility] in all ranges. Shoulder flexion: 3+/5, shoulder abduction: 3+/5. IR/er: 3+/5. Functional gross grasp. Without use of pain medication: patient reports A/ROM of approximately 25% range in the past 2-3 weeks.    Lower Extremity Assessment Lower Extremity Assessment: Defer to PT evaluation       Communication Communication Communication: No difficulties   Cognition Arousal/Alertness: Awake/alert Behavior During Therapy: WFL for tasks assessed/performed Overall Cognitive Status: Within Functional Limits for tasks assessed                Home Living Family/patient expects to be discharged to:: Private residence Living Arrangements: Children(Daughter has CP) Available Help at Discharge: Family;Available 24 hours/day Type of Home: House Home Access: Stairs to enter [employer] of Steps: 6 Entrance Stairs-Rails: Can reach both Home Layout: One level     Bathroom Shower/Tub: Teacher, early years/pre: Standard     Home Equipment: Hand held shower head          Prior Functioning/Environment Level of Independence: Independent        Comments: Pt reports new onset of Left shoulder pain with an onset of 2-3 weeks ago. No known injury.        OT Problem List: Pain;Impaired  UE functional use;Decreased strength;Decreased range of motion         OT Goals(Current goals can be found in the care plan section) Acute Rehab OT Goals Patient Stated Goal: to go home   AM-PAC OT "6 Clicks" Daily Activity     Outcome Measure Help from another person eating meals?: None Help from another person taking care of personal grooming?: A Little Help from another person toileting, which includes using toliet, bedpan, or urinal?: A Little Help from another person bathing (including washing, rinsing, drying)?: A Little Help from another person to put on and taking off regular upper body clothing?: A Little Help from another person to put on and taking off regular lower body clothing?: A Little 6 Click Score: 19   End of Session    Activity Tolerance: Patient tolerated treatment well Patient left: in bed;with call bell/phone within reach;with bed alarm set  OT Visit Diagnosis: Muscle weakness (generalized) (M62.81)                Time: 3664-4034 OT Time Calculation (min): 22 min Charges:  OT General Charges $OT Visit: 1 Visit OT Evaluation $OT Eval Low Complexity: 1 Low OT Treatments $Self Care/Home Management : 8-22 mins(15')  Ailene Ravel, OTR/L,CBIS  [phone]   Aayat Hajjar, Clarene Duke 10/28/2018, 11:07 AM

## 2018-10-28 NOTE — Progress Notes (Addendum)
Notified by secretary patient requested pain meds. Arrived to room and patient tearful and uncomfortable in bed. Complained of 9/10 shoulder pain. Patient tearful, anxious, and sobbing to point having difficulty speaking. Patient stated she woke up in pain (shoulder) due to being in one position while sleeping. RN and NT repositioned patient and PRN oxy given at 2045. Patient inquired if shoulder was fractured, if ortho had seen her as she was having difficulty remembering given procedure and fatigue. Ice applied to shoulder.   Patient stated she had not eaten since day prior and has not slept. Endorsed an appetite and offered snacks; provided lemon ice. Based on patient's emotional distress and pain level indicated will speak to MD about changing pain medication order to address pain.  Upon reassessment at 2234, patient was asleep then aroused when RN entered room. Patient calmer and conversational. Stated pain was ok; 2/10. Inquired further about ortho info about shoulder fracture. Informed per MD no obvious fracture, but possibility. Stated has questions that she will write down on notepad she has. Stated current pain medication ok.Informed patient if current pain medication not addressing pain, to ask RN to follow up with physician to ensure pain appropriately managed.  Informed day RN to give patient bath in AM prior to applying AM Lotrimin.

## 2018-10-28 NOTE — Progress Notes (Signed)
Discussed CT of head with patient and she states she would like to get some rest and she will go down later. Will pass this along to night shift.   Hiram Comber, RN 10/28/2018 5:34 PM

## 2018-10-28 NOTE — Progress Notes (Signed)
Patient extremely tearful and anxious upon examination. Patient complaining of R clavicle and bilateral rib pain. PRN oxycodone administered. After administration, RN attempted to talk to patient. Patient continued to be tearful and snapped saying "I just want to be left alone. There's nothing else that you can do for me."    Hiram Comber, RN 10/28/2018 1:58 PM

## 2018-10-28 NOTE — Progress Notes (Signed)
Family Medicine Teaching Service Daily Progress Note Intern Pager: [phone]  Patient name: Suzanne [name] Medical record number: [id number] Date of birth: 08-26-1960 Age: 58 y.o. Gender: female  Primary Care Provider: Patient, No Pcp Per Consultants: None Code Status: Full  Pt Overview and Major Events to Date:  5/31 Admitted to FPTS  Assessment and Plan: Suzanne [name] is a 58 y.o. female presenting with worsening SOB and found to have Hgb 4.5. No significant PMH.   Dyspnea new onset. Likely multifactorial from symptomatic anemia and newly discovered possible metastatic disease Moderate bilateral pleural effusions seen on CT, likely malignant.  Planning for thoracentesis today.  Echo with EF 60 to 32%, diastolic function indeterminate.  Anemia improving s/p 2 units packed red blood cells.  Hemoglobin this a.m. stable at 7.7.  Patient on 1 L overnight with O2 sats 96% this a.m.  Breathing this a.m. comfortable on 2L, no desat noted. - IR consulted, to perform thoracentesis today - PT/OT eval: HH PT - consider palliative consult if indicated  Acute Severe Anemia: Hgb 4.5>2upRBC>7.7 this AM.  LDH elevated to 312, could note hemolytic process.  FOBT positive, GI consulted and noted that they will wait for oncology work-up as PET scan may indicate whether EGD or colonoscopy needs to be performed.  Vital signs stable, no signs of active bleeding. - S/p 2 units pRBC in ED - transfusion threshold <7 - GI consulted, appreciate recommendations -Clear liquid diet per GI - SCDs given possible acute bleed  Possible Right Clavicular Fracture Overnight, patient turned and heard a crack, with pain and swelling at her right clavicle.  Patient had chest x-ray, that showed new left rib fractures, but unable to see clavicle clearly.  On exam this a.m.no visible step-off or deformity, no TTP of length of clavicle or crepitus with palpation.  Doubt clavicular fracture given this exam, but will continue  to monitor.  Patient also denies right clavicular pain this AM.  Patient also with elevated alk phos, and likely extensive metastatic cancer to bone, therefore most likely cause of patient's rib fractures. - oncology plan per below - ice - consider ortho consult if clavicular pain worsens - oxy 2.22m q4h prn for bone pain given likely cancer and bone pain  Acute Right Breast Changes  Concern for new metastatic disease of undetermined primary: Patient with new area with thickening of the right breast and intermittent bloody discharge noted on exam.  CT with extensive interstitial thickening and lung opacities.  Also with bilateral moderate pleural effusions, likely malignant.  Oncology consulted yesterday and recommended obtaining CA 27.29, thoracentesis with breast prognostic panel, CT head to complete staging, started on anastrozole.  Patient's bone scan with widespread bony mets.  Patient also with new fractures noted last night. -Follow-up CA 27.29 -Follow-up thoracentesis studies: Breast prognostic panel -Follow-up oncology, appreciate recommendations  Hypokalemia: K3.3 this a.m., mag within normal limits. -K-Dur 40 mEq x 1 -Follow-up BMP  Elevated Alkaline Phosphatase: Cholelithiasis noted on CT. Denies any RUQ pain, although currently experiencing right breast pain that may be shielding symptoms. Denies any nausea or vomiting. AST/ALT and bilirubin levels WNL.  Likely secondary to bone turnover given bony metastasis.  New onset LE edema  Bilateral Pleural effusion Exam with trace to 1+ pitting exam on exam and bilateral pleural effusions on imaging.  Echo with EF 60 to 620% diastolic function could not be determined.  Bilateral pleural effusions likely malignant in nature. -Plan per above  Inframammary Candidal Intertrigo: Severe intertrigo along  bilateral inframammary folds.  - Clotrimazole BID x 2-4 weeks  Atherosclerosis/CAD: Aortic atherosclerosis and moderate coronary  artery calcifications in the left anterior descending coronary artery on CT scan.  No known history of CAD.  EKG without acute ischemic changes.  Patient remains without chest pain -Continue to monitor  FEN/GI: Clear liquids pending GI work-up PPx: SCDs given anemia  Disposition: pending PT/OT, clinical improvement  Subjective:  Patient complaining of right clavicular pain and "crack" overnight.  Received chest x-ray, that did not clearly show clavicular fracture, but did show left-sided rib fractures.  This a.m., patient denies right clavicular pain.  She denies any other complaints at present.  Overall, she states that she is trying to stay optimistic.  Objective: Temp:  [97.7 F (36.5 C)-98.5 F (36.9 C)] 97.7 F (36.5 C) (06/02 0658) Pulse Rate:  [91-97] 97 (06/02 0658) BP: (151-158)/(71-78) 158/78 (06/02 0658) SpO2:  [96 %-97 %] 96 % (06/02 0658) Weight:  [109.5 kg] 109.5 kg (06/02 0558)  Physical Exam:  General: 58 y.o. female in NAD Cardio: RRR no m/r/g Lungs: CTAB, no wheezing, no rhonchi, no crackles, no IWOB on 2L Abdomen: Soft, non-tender to palpation, non-distended, positive bowel sounds Skin: warm and dry Extremities: 1-2+ pitting edema BLE, no TTP along right clavicle, no visible deformity or step off on palpation, when asked to perform RUE ROM, she states "I'd rather not, I know my shoulder will pop out"    Laboratory: Recent Labs  Lab 10/26/18 1921 10/27/18 0859 10/28/18 0232  WBC 6.8 7.4 6.7  HGB 4.7* 7.3* 7.7*  HCT 15.8* 22.5* 23.5*  PLT PLATELET CLUMPS NOTED ON SMEAR, COUNT APPEARS DECREASED 97* 97*   Recent Labs  Lab 10/26/18 1921 10/27/18 0016 10/27/18 0859 10/28/18 0232  NA 141  --  141 139  K 2.9*  --  3.6 3.3*  CL 102  --  102 101  CO2 24  --  28 25  BUN 21*  --  17 17  CREATININE 0.95  --  1.04* 0.88  CALCIUM 9.8  --  9.9 10.3  PROT  --  6.0* 6.1*  --   BILITOT  --  1.2 1.9*  --   ALKPHOS  --  379* 367*  --   ALT  --  25 24  --   AST   --  41 38  --   GLUCOSE 102*  --  92 100*    Imaging/Diagnostic Tests: Nm Bone Scan Whole Body  Result Date: 10/27/2018 CLINICAL DATA:  Breast cancer staging. EXAM: NUCLEAR MEDICINE WHOLE BODY BONE SCAN TECHNIQUE: Whole body anterior and posterior images were obtained approximately 3 hours after intravenous injection of radiopharmaceutical. RADIOPHARMACEUTICALS:  20.6 mCi Technetium-36mMDP IV COMPARISON:  CT scan Oct 26, 2018 FINDINGS: Bony metastatic disease is seen in both of the femurs, numerous ribs, the manubrium, and right humerus. The widespread bony metastatic disease in the scapula, thoracic spine, sternum, and proximal humeri is better appreciated on the comparison CT scan. IMPRESSION: Widespread bony metastatic disease. The extent of metastatic disease is actually better appreciated on the recent CT imaging. This study is a near super scan consistent with widespread bony metastatic disease. Electronically Signed   By: DDorise BullionIII M.D   On: 10/27/2018 16:01   Dg Chest Port 1 View  Result Date: 10/28/2018 CLINICAL DATA:  Bone injury EXAM: PORTABLE CHEST 1 VIEW COMPARISON:  10/26/2018 FINDINGS: Diffuse widespread metastatic disease is again noted involving the osseous structures. There is an acute  appearing fracture involving the fourth rib posterolaterally on the left. There are likely additional left-sided rib fractures that are suboptimally evaluated on this exam. The lung volumes are low. The cardiac silhouette is enlarged. There are growing bilateral pleural effusions and prominent bilateral interstitial lung markings. Bibasilar airspace opacities are noted. IMPRESSION: 1. Acute left-sided rib fracture involving the fourth rib posterolaterally. No pneumothorax. There are likely additional adjacent fractures that are suboptimally evaluated on this exam. 2. Low lung volumes. 3. Cardiomegaly with growing bilateral pleural effusions and prominent interstitial lung markings suggestive of  progressive pulmonary edema. Electronically Signed   By: Constance Holster M.D.   On: 10/28/2018 01:55    Romina Divirgilio, Bernita Raisin, DO 10/28/2018, 7:53 AM PGY-1, [place] Intern pager: [phone], text pages welcome

## 2018-10-28 NOTE — Progress Notes (Addendum)
HEMATOLOGY-ONCOLOGY PROGRESS NOTE   SUBJECTIVE: Having pain to R clavicle. Using Oxycodone 2.5 mg PRN. Not fully relieving her pain.  Nursing reports tearful at time and having difficulty processing her condition. No other new issues.   REVIEW OF SYSTEMS:   14 point comprehensive review of systems was negative except as noted in the HPI.  I have reviewed the past medical history, past surgical history, social history and family history with the patient and they are unchanged from previous note.   PHYSICAL EXAMINATION: Vitals:   10/28/18 1342 10/28/18 1403  BP: (!) 148/88   Pulse: 98   Resp: 18   Temp: 98.5 F (36.9 C)   SpO2: 100% 97%   Filed Weights   10/27/18 0118 10/28/18 0558  Weight: 227 lb 1.2 oz (103 kg) 241 lb 6.5 oz (109.5 kg)    Intake/Output from previous day: 06/01 0701 - 06/02 0700 In: 200 [P.O.:200] Out: -   GENERAL:alert, tearful at times SKIN: skin color, texture, turgor are normal, no rashes or significant lesions EYES: normal, Conjunctiva are pink and non-injected, sclera clear OROPHARYNX:no exudate, no erythema and lips, buccal mucosa, and tongue normal  NECK: supple, thyroid normal size, non-tender, without nodularity LYMPH:  no palpable lymphadenopathy in the cervical, axillary or inguinal LUNGS: clear to auscultation and percussion with normal breathing effort HEART: regular rate & rhythm and no murmurs and 1+ BLE ABDOMEN:abdomen soft, non-tender and normal bowel sounds Musculoskeletal: Pain to R clavicle with minimal movement  NEURO: alert & oriented x 3 with fluent speech, no focal motor/sensory deficits  LABORATORY DATA:  I have reviewed the data as listed CMP Latest Ref Rng & Units 10/28/2018 10/27/2018 10/27/2018  Glucose 70 - 99 mg/dL 100(H) 92 -  BUN 6 - 20 mg/dL 17 17 -  Creatinine 0.44 - 1.00 mg/dL 0.88 1.04(H) -  Sodium 135 - 145 mmol/L 139 141 -  Potassium 3.5 - 5.1 mmol/L 3.3(L) 3.6 -  Chloride 98 - 111 mmol/L 101 102 -  CO2 22 - 32  mmol/L 25 28 -  Calcium 8.9 - 10.3 mg/dL 10.3 9.9 -  Total Protein 6.5 - 8.1 g/dL - 6.1(L) 6.0(L)  Total Bilirubin 0.3 - 1.2 mg/dL - 1.9(H) 1.2  Alkaline Phos 38 - 126 U/L - 367(H) 379(H)  AST 15 - 41 U/L - 38 41  ALT 0 - 44 U/L - 24 25    Lab Results  Component Value Date   WBC 6.7 10/28/2018   HGB 7.7 (L) 10/28/2018   HCT 23.5 (L) 10/28/2018   MCV 98.7 10/28/2018   PLT 97 (L) 10/28/2018   NEUTROABS 4.5 10/26/2018    Ct Chest W Contrast  Result Date: 10/26/2018 CLINICAL DATA:  Shortness of breath, possible metastatic disease. Breast cancer. EXAM: CT CHEST WITH CONTRAST TECHNIQUE: Multidetector CT imaging of the chest was performed during intravenous contrast administration. CONTRAST:  88m OMNIPAQUE IOHEXOL 300 MG/ML  SOLN COMPARISON:  Chest x-ray earlier today FINDINGS: Cardiovascular: Mild cardiomegaly. Moderate coronary artery calcifications in the left anterior descending coronary artery. Aorta is normal caliber. Scattered atherosclerotic change. Mediastinum/Nodes: Numerous borderline sized and mildly enlarged mediastinal lymph nodes. Right paratracheal node measures 12 mm. Borderline size bilateral hilar lymph nodes, the largest on the right measuring 11 mm in short axis diameter. Numerous enlarged bilateral axillary lymph nodes with index left axillary lymph node having a short axis diameter of 2.1 cm on image 21 and right axillary lymph node having a short axis diameter of 1.4 cm on image  48. Lungs/Pleura: Moderate bilateral pleural effusions. Extensive interstitial disease throughout the lungs bilaterally. Mild reticulonodular densities. Cannot exclude lymphangitic spread of tumor. Upper Abdomen: Gallstones noted within the gallbladder. Musculoskeletal: Chest wall soft tissues are unremarkable. Extensive diffuse mixed lytic and sclerotic lesions throughout the visualized skeleton compatible with diffuse skeletal metastases. IMPRESSION: Bilateral axillary and mediastinal adenopathy.  Borderline hilar lymph nodes bilaterally. Findings likely reflect metastatic lymphadenopathy. Moderate bilateral pleural effusions. Extensive interstitial thickening and reticulonodular opacities throughout the lungs. Cannot exclude lymphangitic spread of tumor. Diffuse skeletal metastases. Cholelithiasis. Coronary artery disease. Aortic Atherosclerosis (ICD10-I70.0). Electronically Signed   By: Rolm Baptise M.D.   On: 10/26/2018 22:54   Nm Bone Scan Whole Body  Result Date: 10/27/2018 CLINICAL DATA:  Breast cancer staging. EXAM: NUCLEAR MEDICINE WHOLE BODY BONE SCAN TECHNIQUE: Whole body anterior and posterior images were obtained approximately 3 hours after intravenous injection of radiopharmaceutical. RADIOPHARMACEUTICALS:  20.6 mCi Technetium-27mMDP IV COMPARISON:  CT scan Oct 26, 2018 FINDINGS: Bony metastatic disease is seen in both of the femurs, numerous ribs, the manubrium, and right humerus. The widespread bony metastatic disease in the scapula, thoracic spine, sternum, and proximal humeri is better appreciated on the comparison CT scan. IMPRESSION: Widespread bony metastatic disease. The extent of metastatic disease is actually better appreciated on the recent CT imaging. This study is a near super scan consistent with widespread bony metastatic disease. Electronically Signed   By: DDorise BullionIII M.D   On: 10/27/2018 16:01   Dg Chest Port 1 View  Result Date: 10/28/2018 CLINICAL DATA:  Bone injury EXAM: PORTABLE CHEST 1 VIEW COMPARISON:  10/26/2018 FINDINGS: Diffuse widespread metastatic disease is again noted involving the osseous structures. There is an acute appearing fracture involving the fourth rib posterolaterally on the left. There are likely additional left-sided rib fractures that are suboptimally evaluated on this exam. The lung volumes are low. The cardiac silhouette is enlarged. There are growing bilateral pleural effusions and prominent bilateral interstitial lung markings.  Bibasilar airspace opacities are noted. IMPRESSION: 1. Acute left-sided rib fracture involving the fourth rib posterolaterally. No pneumothorax. There are likely additional adjacent fractures that are suboptimally evaluated on this exam. 2. Low lung volumes. 3. Cardiomegaly with growing bilateral pleural effusions and prominent interstitial lung markings suggestive of progressive pulmonary edema. Electronically Signed   By: CConstance HolsterM.D.   On: 10/28/2018 01:55   Dg Chest Portable 1 View  Result Date: 10/26/2018 CLINICAL DATA:  Shortness of breath, possible right breast cancer EXAM: PORTABLE CHEST 1 VIEW COMPARISON:  None. FINDINGS: Low lung volumes. No pleural effusion. Diffuse interstitial opacity. Heart size upper limits of normal. No pneumothorax. Diffuse sclerosis and lucent lesions within the clavicles, ribs and shoulders. IMPRESSION: 1. Low lung volumes without consolidation or effusion. Diffuse bilateral interstitial opacity of unknown chronicity. Findings could be secondary to interstitial inflammatory process, atypical/viral pneumonia, or possible metastatic disease given clinical history. CT chest suggested for further evaluation 2. Sclerotic and lytic lesions within the clavicles, bilateral ribs and shoulders, concerning for diffuse skeletal metastatic disease Electronically Signed   By: KDonavan FoilM.D.   On: 10/26/2018 20:11    ASSESSMENT: 58[place]   1. Metastatic cancer.  Given her changes in her right breast, suspect this represents a stage IV breast cancer. 2.  Pleural effusions  3. Bone metastases 4. Left rib fractures and right clavicle pain due to possible fracture 5.  Anemia 6.  Thrombocytopenia 7.  Obesity  PLAN: -  Work-up today has been reviewed.  CA 27.29, CT of the head, and biopsy are still pending.  Bone scan consistent with widespread bony metastatic disease. -Continue anastrozole pending results of  cytology -Continue ice packs and pain medication for painful bone mets.  Consider increasing the dose of oxycodone if pain is not fully controlled with the current dose. -Hemoglobin is stable.  Continue to monitor and transfuse for hemoglobin less than 7.0 or active bleeding -Platelet count remains stable.  Continue to monitor and consider platelet transfusion if lately count less than 10,000. -We will plan for outpatient follow-up at the cancer center on 10/31/2018.    LOS: 2 days   Mikey Bussing, DNP, AGPCNP-BC, AOCNP 10/28/18    ADDENDUM: I met with the [facility] today and reviewed her situation and what we know to this point.  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  We do not see evidence of involvement of the liver and head CT is pending.  This is consistent with stage IV/metastatic breast cancer  She understands that stage IV breast cancer is not curable with our current knowledge base. The goal of treatment is control. The strategy of treatment is to do only the minimum necessary to control the growth of the tumor so that the patient can have as normal a life as possible. There is no survival advantage in treating aggressively if treating less aggressively results in tumor control. With this strategy stage IV breast cancer in many cases can function as a "chronic illness": something that cannot be quite gotten rid of but can be controlled for an indefinite period of time  Tentatively she is being started on anastrozole since approximately two thirds of breast cancers are estrogen dependent.  Once we have the definitive biopsy and breast prognostic panel we can decide whether to add palbociclib or to switch to chemotherapy depending on results.  We discussed healthcare power of attorney issues and she tells me that her daughter is very competent and she would certainly name her as her  healthcare power of attorney.  Tentatively I have made an outpatient appointment for [facility] to see me at the cancer center 10/31/2018 at 9:30 AM.  If she is still in the hospital at that time we will move the appointment to the following Friday  I personally saw this patient and performed a substantive portion of this encounter with the listed APP documented above.   Chauncey Cruel, MD Medical Oncology and Hematology [facility] [address] [place], [place] [address] Tel. [phone]    Fax. [phone]

## 2018-10-28 NOTE — Progress Notes (Addendum)
FPTS Interim Progress Note  CXR notable for new LEFT sided rib fracture involving 4th rib plus likely additional fractures that are difficult to see on exam. Discussed x-ray with radiologist Dr. Nyoka Cowden who states that there is no obvious right clavicle fracture but very difficult to assess medially and given her extensive bone metastasis and new fractures from prior CT, she is obviously very prone to fractures and it is very possible she has a clavicle fracture that is just difficult to assess on current imaging.   Growing bilateral pleural effusions and progressive pulmonary edema also noted on xray coinciding with patient's worsening SOB.   Plan: Concern for right clavicular fracture:  Will plan to get patient into comfortable position in bed but aim for to remain upright but on her back to allow for better breathing. Attempt to stabilize arm to side and limit ROM at this time. Ice to right shoulder throughout night. Tylenol for pain + Toradol 30mg  IV x 1. Opted against Oxy due to worsening respiratory issues at this time.  - Consider ortho consult in AM.  - Diagnostic and (hopefully) therapeutic thoracentesis in AM  - Can consider lasix to help with pulmonary edema/pleural effusions, although this isnt shown to be the most effective for metastatic pleural effusions  Danna Hefty, DO 10/28/2018, 2:16 AM PGY-1, [place] Service pager [phone]

## 2018-10-29 ENCOUNTER — Inpatient Hospital Stay (HOSPITAL_COMMUNITY): Payer: Medicaid Other

## 2018-10-29 ENCOUNTER — Encounter (HOSPITAL_COMMUNITY): Payer: Self-pay | Admitting: Student

## 2018-10-29 HISTORY — PX: IR THORACENTESIS ASP PLEURAL SPACE W/IMG GUIDE: IMG5380

## 2018-10-29 LAB — CBC
HCT: 23.2 % — ABNORMAL LOW (ref 36.0–46.0)
Hemoglobin: 7.2 g/dL — ABNORMAL LOW (ref 12.0–15.0)
MCH: 31.6 pg (ref 26.0–34.0)
MCHC: 31 g/dL (ref 30.0–36.0)
MCV: 101.8 fL — ABNORMAL HIGH (ref 80.0–100.0)
Platelets: 92 10*3/uL — ABNORMAL LOW (ref 150–400)
RBC: 2.28 MIL/uL — ABNORMAL LOW (ref 3.87–5.11)
RDW: 22.5 % — ABNORMAL HIGH (ref 11.5–15.5)
WBC: 6.2 10*3/uL (ref 4.0–10.5)
nRBC: 2.7 % — ABNORMAL HIGH (ref 0.0–0.2)

## 2018-10-29 LAB — CANCER ANTIGEN 27.29: CA 27.29: 1810.4 U/mL — ABNORMAL HIGH (ref 0.0–38.6)

## 2018-10-29 LAB — BASIC METABOLIC PANEL
Anion gap: 12 (ref 5–15)
BUN: 17 mg/dL (ref 6–20)
CO2: 26 mmol/L (ref 22–32)
Calcium: 10.1 mg/dL (ref 8.9–10.3)
Chloride: 99 mmol/L (ref 98–111)
Creatinine, Ser: 0.96 mg/dL (ref 0.44–1.00)
GFR calc Af Amer: >60 mL/min (ref >60)
GFR calc non Af Amer: >60 mL/min (ref >60)
Glucose, Bld: 84 mg/dL (ref 70–99)
Potassium: 3.7 mmol/L (ref 3.5–5.1)
Sodium: 137 mmol/L (ref 135–145)

## 2018-10-29 MED ORDER — LIDOCAINE HCL 1 % IJ SOLN
INTRAMUSCULAR | Status: AC
  Filled 2018-10-29: qty 20

## 2018-10-29 NOTE — Procedures (Signed)
PROCEDURE SUMMARY:  Successful image-guided left thoracentesis. Yielded 350 milliliters of hazy gold fluid. Patient tolerated procedure well. No immediate complications. EBL = 5 mL.  Specimen was not sent for labs. CXR ordered.  Claris Pong Moosa Bueche PA-C 10/29/2018 3:31 PM

## 2018-10-29 NOTE — Progress Notes (Signed)
HEMATOLOGY-ONCOLOGY PROGRESS NOTE   SUBJECTIVE: Feels better today. Clavicle pain is better controlled. Reports breathing improved since right-sided thoracentesis. Can now lay flat. Denies bleeding. Has no new issues today. Still waiting to have CT of head. Will have left sided thoracentesis later today.  REVIEW OF SYSTEMS:   14 point comprehensive review of systems was negative except as noted in the HPI.  I have reviewed the past medical history, past surgical history, social history and family history with the patient and they are unchanged from previous note.   PHYSICAL EXAMINATION: Vitals:   10/29/18 0540 10/29/18 1315  BP: (!) 154/78 132/72  Pulse: 97 95  Resp:  16  Temp: 98.9 F (37.2 C) 98.2 F (36.8 C)  SpO2: 97% 97%   Filed Weights   10/27/18 0118 10/28/18 0558 10/29/18 0500  Weight: 227 lb 1.2 oz (103 kg) 241 lb 6.5 oz (109.5 kg) 235 lb 7.2 oz (106.8 kg)    Intake/Output from previous day: 06/02 0701 - 06/03 0700 In: 418 [P.O.:418] Out: 600   GENERAL:alert, comfortable. SKIN: skin color, texture, turgor are normal, no rashes or significant lesions EYES: normal, Conjunctiva are pink and non-injected, sclera clear OROPHARYNX:no exudate, no erythema and lips, buccal mucosa, and tongue normal  NECK: supple, thyroid normal size, non-tender, without nodularity LYMPH:  no palpable lymphadenopathy in the cervical, axillary or inguinal LUNGS: clear to auscultation and percussion with normal breathing effort HEART: regular rate & rhythm and no murmurs and 1+ BLE ABDOMEN:abdomen soft, non-tender and normal bowel sounds Musculoskeletal: Pain to R clavicle with minimal movement  NEURO: alert & oriented x 3 with fluent speech, no focal motor/sensory deficits  LABORATORY DATA:  I have reviewed the data as listed CMP Latest Ref Rng & Units 10/29/2018 10/28/2018 10/27/2018  Glucose 70 - 99 mg/dL 84 100(H) 92  BUN 6 - 20 mg/dL 17 17 17   Creatinine 0.44 - 1.00 mg/dL 0.96 0.88  1.04(H)  Sodium 135 - 145 mmol/L 137 139 141  Potassium 3.5 - 5.1 mmol/L 3.7 3.3(L) 3.6  Chloride 98 - 111 mmol/L 99 101 102  CO2 22 - 32 mmol/L 26 25 28   Calcium 8.9 - 10.3 mg/dL 10.1 10.3 9.9  Total Protein 6.5 - 8.1 g/dL - - 6.1(L)  Total Bilirubin 0.3 - 1.2 mg/dL - - 1.9(H)  Alkaline Phos 38 - 126 U/L - - 367(H)  AST 15 - 41 U/L - - 38  ALT 0 - 44 U/L - - 24    Lab Results  Component Value Date   WBC 6.2 10/29/2018   HGB 7.2 (L) 10/29/2018   HCT 23.2 (L) 10/29/2018   MCV 101.8 (H) 10/29/2018   PLT 92 (L) 10/29/2018   NEUTROABS 4.5 10/26/2018    Dg Chest 1 View  Result Date: 10/28/2018 CLINICAL DATA:  Post right thoracentesis EXAM: CHEST  1 VIEW COMPARISON:  10/28/2018 FINDINGS: Very low lung volumes again noted. Interstitial prominence within the lungs. Heart is borderline in size with vascular congestion. No pneumothorax following right thoracentesis. Diffuse skeletal metastases again noted. IMPRESSION: Very low lung volumes with cardiomegaly, vascular congestion and interstitial prominence. No pneumothorax following thoracentesis. Electronically Signed   By: Rolm Baptise M.D.   On: 10/28/2018 16:00   Ct Chest W Contrast  Result Date: 10/26/2018 CLINICAL DATA:  Shortness of breath, possible metastatic disease. Breast cancer. EXAM: CT CHEST WITH CONTRAST TECHNIQUE: Multidetector CT imaging of the chest was performed during intravenous contrast administration. CONTRAST:  25mL OMNIPAQUE IOHEXOL 300 MG/ML  SOLN COMPARISON:  Chest x-ray earlier today FINDINGS: Cardiovascular: Mild cardiomegaly. Moderate coronary artery calcifications in the left anterior descending coronary artery. Aorta is normal caliber. Scattered atherosclerotic change. Mediastinum/Nodes: Numerous borderline sized and mildly enlarged mediastinal lymph nodes. Right paratracheal node measures 12 mm. Borderline size bilateral hilar lymph nodes, the largest on the right measuring 11 mm in short axis diameter. Numerous  enlarged bilateral axillary lymph nodes with index left axillary lymph node having a short axis diameter of 2.1 cm on image 21 and right axillary lymph node having a short axis diameter of 1.4 cm on image 48. Lungs/Pleura: Moderate bilateral pleural effusions. Extensive interstitial disease throughout the lungs bilaterally. Mild reticulonodular densities. Cannot exclude lymphangitic spread of tumor. Upper Abdomen: Gallstones noted within the gallbladder. Musculoskeletal: Chest wall soft tissues are unremarkable. Extensive diffuse mixed lytic and sclerotic lesions throughout the visualized skeleton compatible with diffuse skeletal metastases. IMPRESSION: Bilateral axillary and mediastinal adenopathy. Borderline hilar lymph nodes bilaterally. Findings likely reflect metastatic lymphadenopathy. Moderate bilateral pleural effusions. Extensive interstitial thickening and reticulonodular opacities throughout the lungs. Cannot exclude lymphangitic spread of tumor. Diffuse skeletal metastases. Cholelithiasis. Coronary artery disease. Aortic Atherosclerosis (ICD10-I70.0). Electronically Signed   By: Rolm Baptise M.D.   On: 10/26/2018 22:54   Nm Bone Scan Whole Body  Result Date: 10/27/2018 CLINICAL DATA:  Breast cancer staging. EXAM: NUCLEAR MEDICINE WHOLE BODY BONE SCAN TECHNIQUE: Whole body anterior and posterior images were obtained approximately 3 hours after intravenous injection of radiopharmaceutical. RADIOPHARMACEUTICALS:  20.6 mCi Technetium-68m MDP IV COMPARISON:  CT scan Oct 26, 2018 FINDINGS: Bony metastatic disease is seen in both of the femurs, numerous ribs, the manubrium, and right humerus. The widespread bony metastatic disease in the scapula, thoracic spine, sternum, and proximal humeri is better appreciated on the comparison CT scan. IMPRESSION: Widespread bony metastatic disease. The extent of metastatic disease is actually better appreciated on the recent CT imaging. This study is a near super scan  consistent with widespread bony metastatic disease. Electronically Signed   By: Dorise Bullion III M.D   On: 10/27/2018 16:01   Dg Chest Port 1 View  Result Date: 10/28/2018 CLINICAL DATA:  Bone injury EXAM: PORTABLE CHEST 1 VIEW COMPARISON:  10/26/2018 FINDINGS: Diffuse widespread metastatic disease is again noted involving the osseous structures. There is an acute appearing fracture involving the fourth rib posterolaterally on the left. There are likely additional left-sided rib fractures that are suboptimally evaluated on this exam. The lung volumes are low. The cardiac silhouette is enlarged. There are growing bilateral pleural effusions and prominent bilateral interstitial lung markings. Bibasilar airspace opacities are noted. IMPRESSION: 1. Acute left-sided rib fracture involving the fourth rib posterolaterally. No pneumothorax. There are likely additional adjacent fractures that are suboptimally evaluated on this exam. 2. Low lung volumes. 3. Cardiomegaly with growing bilateral pleural effusions and prominent interstitial lung markings suggestive of progressive pulmonary edema. Electronically Signed   By: Constance Holster M.D.   On: 10/28/2018 01:55   Dg Chest Portable 1 View  Result Date: 10/26/2018 CLINICAL DATA:  Shortness of breath, possible right breast cancer EXAM: PORTABLE CHEST 1 VIEW COMPARISON:  None. FINDINGS: Low lung volumes. No pleural effusion. Diffuse interstitial opacity. Heart size upper limits of normal. No pneumothorax. Diffuse sclerosis and lucent lesions within the clavicles, ribs and shoulders. IMPRESSION: 1. Low lung volumes without consolidation or effusion. Diffuse bilateral interstitial opacity of unknown chronicity. Findings could be secondary to interstitial inflammatory process, atypical/viral pneumonia, or possible metastatic disease given clinical history. CT  chest suggested for further evaluation 2. Sclerotic and lytic lesions within the clavicles, bilateral ribs and  shoulders, concerning for diffuse skeletal metastatic disease Electronically Signed   By: Donavan Foil M.D.   On: 10/26/2018 20:11   Ir Thoracentesis Asp Pleural Space W/img Guide  Result Date: 10/28/2018 INDICATION: Patient with history of metastatic disease (including bone metastases) on imaging, dyspnea, and bilateral pleural effusions (R>L). Request is made for diagnostic and therapeutic right thoracentesis. EXAM: ULTRASOUND GUIDED DIAGNOSTIC AND THERAPEUTIC RIGHT THORACENTESIS MEDICATIONS: 10 mL 1% lidocaine COMPLICATIONS: None immediate. PROCEDURE: An ultrasound guided thoracentesis was thoroughly discussed with the patient and questions answered. The benefits, risks, alternatives and complications were also discussed. The patient understands and wishes to proceed with the procedure. Written consent was obtained. Ultrasound was performed to localize and mark an adequate pocket of fluid in the right chest. The area was then prepped and draped in the normal sterile fashion. 1% Lidocaine was used for local anesthesia. Under ultrasound guidance a 6 Fr Safe-T-Centesis catheter was introduced. Thoracentesis was performed. The catheter was removed and a dressing applied. FINDINGS: A total of approximately 600 mL of clear gold fluid was removed. Samples were sent to the laboratory as requested by the clinical team. IMPRESSION: Successful ultrasound guided right thoracentesis yielding 600 mL of pleural fluid. Read by: Earley Abide, PA-C Electronically Signed   By: Jerilynn Mages.  Shick M.D.   On: 10/28/2018 16:10    ASSESSMENT: 58 y.o. female from [place], [place] with   1. 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 2. Pleural effusions  - S/P right thoracentesis on 10/28/2018; left thoracentesis planned to later today (10/29/2018) 3. Bone metastases 4. Left rib fractures and right clavicle pain due to possible fracture 5. Anemia 6. Thrombocytopenia 7.  Obesity  PLAN: -Work-up today has been reviewed.  CA 27.29 elevated at 1,810.4. CT of the head and biopsy are still pending.  Bone scan consistent with widespread bony metastatic disease. -Continue anastrozole pending results of cytology -Continue ice packs and pain medication for painful bone mets.   -Hemoglobin down to 7.2 today.  Continue to monitor and transfuse for hemoglobin less than 7.0 or active bleeding -Platelet count remains stable.  Continue to monitor and consider platelet transfusion if lately count less than 10,000. -We will plan for outpatient follow-up at the cancer center on 10/31/2018.    LOS: 3 days   Mikey Bussing, DNP, AGPCNP-BC, AOCNP 10/29/18

## 2018-10-29 NOTE — Progress Notes (Signed)
Physical Therapy Treatment Patient Details Name: Suzanne [name] MRN: [id number] DOB: 1960-10-12 Today's Date: 10/29/2018    History of Present Illness pt admitted 10/26/18 with Progressively worsening SOB with both exertion and at rest for 1 month. Etiology is likely multifactorial including acute severe anemia of 4.5 on admission and concern for new metastatic disease noted on CT and CXR with extensive interstitial thickening and reticulonodular lung opacities bilaterally, moderate bilateral pleural effusions, and numerous mediastinal, hilar, and axillary lymphnodes. Pleural effusions likely 2/2 to metastatic disease.    PT Comments    Pt progressing towards goals. Reports SOB has improved, however, did limit mobility. Oxygen sats at 99-100% on 2L. Required min guard to supervision for mobility tasks. Current recommendations appropriate. Will continue to follow acutely to maximize functional mobility.    Follow Up Recommendations  Home health PT     Equipment Recommendations  Other (comment)(rollator)    Recommendations for Other Services       Precautions / Restrictions Precautions Precautions: Fall;Shoulder Type of Shoulder Precautions: possible right clavicle fx. Per MD noted stabilize RUE and refrain from ROM. Possible left side rib fracture Restrictions Weight Bearing Restrictions: No    Mobility  Bed Mobility Overal bed mobility: Needs Assistance Bed Mobility: Sit to Supine       Sit to supine: Mod assist   General bed mobility comments: Mod A for LE assist to return to supine.   Transfers Overall transfer level: Needs assistance Equipment used: None Transfers: Sit to/from Stand Sit to Stand: Supervision         General transfer comment: Supervision for safety.   Ambulation/Gait Ambulation/Gait assistance: Min guard Gait Distance (Feet): 20 Feet Assistive device: None Gait Pattern/deviations: Step-through pattern;Decreased stride length Gait velocity:  Decreased    General Gait Details: Slow, waddle type gait. SOB noted with 2L, however, pt reports it has improved. Pt oxygen sats at 99-100% on 2L.    Stairs             Wheelchair Mobility    Modified Rankin (Stroke Patients Only)       Balance Overall balance assessment: Mild deficits observed, not formally tested                                          Cognition Arousal/Alertness: Awake/alert Behavior During Therapy: WFL for tasks assessed/performed Overall Cognitive Status: Within Functional Limits for tasks assessed                                        Exercises General Exercises - Lower Extremity Ankle Circles/Pumps: AROM;Both;20 reps;Supine    General Comments        Pertinent Vitals/Pain Pain Assessment: Faces Faces Pain Scale: Hurts little more Pain Location: right shoulder - with small movement or holding grab bar to stand from toilet. Pain Descriptors / Indicators: Aching Pain Intervention(s): Limited activity within patient's tolerance;Monitored during session;Repositioned    Home Living                      Prior Function            PT Goals (current goals can now be found in the care plan section) Acute Rehab PT Goals Patient Stated Goal: to go home PT Goal Formulation: With patient Time  For Goal Achievement: 11/10/18 Potential to Achieve Goals: Good Progress towards PT goals: Progressing toward goals    Frequency    Min 3X/week      PT Plan Current plan remains appropriate    Co-evaluation              AM-PAC PT "6 Clicks" Mobility   Outcome Measure  Help needed turning from your back to your side while in a flat bed without using bedrails?: None Help needed moving from lying on your back to sitting on the side of a flat bed without using bedrails?: A Little Help needed moving to and from a bed to a chair (including a wheelchair)?: A Little Help needed standing up from a  chair using your arms (e.g., wheelchair or bedside chair)?: A Little Help needed to walk in hospital room?: A Little Help needed climbing 3-5 steps with a railing? : A Lot 6 Click Score: 18    End of Session Equipment Utilized During Treatment: Oxygen Activity Tolerance: Patient limited by fatigue Patient left: in bed;with call bell/phone within reach;with bed alarm set Nurse Communication: Mobility status PT Visit Diagnosis: Muscle weakness (generalized) (M62.81);Difficulty in walking, not elsewhere classified (R26.2)     Time: 3009-2330 PT Time Calculation (min) (ACUTE ONLY): 15 min  Charges:  $Gait Training: 8-22 mins                     Leighton Ruff, PT, DPT  Acute Rehabilitation Services  Pager: [phone] Office: [phone]    Rudean Hitt 10/29/2018, 12:41 PM

## 2018-10-29 NOTE — Progress Notes (Signed)
Orthopedic Tech Progress Note Patient Details:  Suzanne [name] 04-27-1961 [id number]  Ortho Devices Type of Ortho Device: Arm sling Ortho Device/Splint Location: right       [place] Pink 10/29/2018, 12:55 PM

## 2018-10-29 NOTE — Discharge Summary (Signed)
[facility] Discharge Summary  Patient name: Suzanne [name] Medical record number: [id number] Date of birth: [birth year] Age: 58 y.o. Gender: female Date of Admission: 10/26/2018  Date of Discharge: 10/30/2018 Admitting Physician: Alveda Reasons, MD  Primary Care Provider: Patient, No Pcp Per Consultants: Oncology, GI  Indication for Hospitalization: Sympomatic Anemia, SOB  Discharge Diagnoses/Problem List:  Acute right breast changes with likely new metastatic disease Dyspnea Anemia Right Clavicle Pain Hypokalemia Elevated alk phos New onset lower extremity edema bilateral pleural effusion Inframammary candidal intertrigo Atherosclerosis/CAD  Disposition: Home with HHPT/OT  Discharge Condition: stable, improved  Discharge Exam:   Physical Exam:  General: 58 y.o. female in NAD Cardio: RRR no m/r/g Lungs: CTAB, no wheezing, no rhonchi, no crackles, no IWOB on 1L Abdomen: Soft, non-tender to palpation, non-distended, positive bowel sounds Skin: warm and dry Extremities: Trace to no edema BLE  Brief Hospital Course:  Suzanne [name] a 58 y.o.femalepresenting with worsening SOB and found to have Hgb 4.5.No significant PMH. Her hospital course is outlined below.  Likely Metastatic Breast Cancer Patient presented with a new area of thickening in her right breast and intermittent bloody discharge from [place].  See H&P for admission details.  She had a CT on presentation with extensive interstitial thickening and reticulonodular opacities throughout the lungs with bilateral axillary, mediastinal, hilar lymphadenopathy and extensive diffuse mixed lytic and sclerotic lesions throughout visualized skeleton suggesting metastasis.  Oncology was consulted and recommended bone scan, which showed widespread bony metastasis.  Also obtain CA 27.29 which was elevated to 1800.  Patient received a right-sided thoracentesis for pleural effusions on 6/2, which met  exudative criteria, therefore likely malignant in nature.  Breast prognostic panel was sent and was pending at the time of discharge.  Oncology started patient on anastrozole and noted that this was likely metastatic stage IV breast cancer.  She also received a CT head during admission for staging which showed widespread osseous metastatic disease to calvarium, skull base, visible cervical spine, no intracranial metastatic disease evident.  Patient also had blood smear showing nucleated red cells, indicating infiltrative process of bone marrow.  Dr. Jana Hakim with oncology follow patient while in the hospital and set up outpatient follow-up for her on 6/5 at 9:30 AM.  Dyspnea Patient presented with new onset dyspnea.  Suspected to have been attributed to anemia versus bilateral pleural effusions noted on initial imaging.  Following improvement in her hemoglobin, patient's dyspnea continued.  She also endorsed orthopnea.  Patient received diagnostic/therapeutic thoracentesis of right lung on 6/2 with improvement in her respiratory status.  She received a left lung therapeutic thoracentesis on 6/3.  Patient had originally been on 2 L O2 during her hospitalization, this was weaned to RA.  She was able to ambulate without desaturation on RA prior to discharge.  Acute Anemia Patient's hemoglobin 4.5 on admission.  Received 2 units packed red blood cells and improved to 7.7.  FOBT performed in ED was positive.  Patient did have blood smear with nucleated red cells while inpatient, indicating likely infiltrative bone marrow.  Patient's hemoglobin remained stable during her admission and was 7.3 on discharge.  GI had noted that they would wait for oncology work-up and if required, could perform colonoscopy or EGD.  Given the patient's largest source of decreased red blood cells was likely her bone marrow infiltrate, hemoglobin was stable, patient did not note hematochezia or melena, can follow-up with GI as outpatient.   Vital signs remained stable throughout hospitalization  and were stable at the time of discharge.  Right Clavicle Pain On the evening of 6/1, patient reported "crack" in right clavicle.  Chest x-ray ordered, which showed new fractures of left ribs, but could not rule out fracture of right clavicle.  Patient's clavicle was examined without tenderness to palpation, no visible deformities, no palpable deformities.  Suspicion for clavicular fracture was very low and remained low during her hospitalization.  Patient was prescribed oxycodone as needed for likely metastatic bone pain and was given an arm sling for support.  Inframammary candidal intertrigo Patient prescribed clotrimazole twice daily x2 to 4 weeks.  Issues for Follow Up:  1. Patient has likely stage IV metastatic cancer.  She will follow-up with oncology on 6/5.  She was also started on anastrozole while inpatient. 2. Patient's hemoglobin on discharge was 7.3.  She received 2 units pRBC while inpatient and had positive FOBT.  Consider GI work-up if needed, see above.  Consider GI referral if indicated. 3. Patient had pleural effusions drained during her hospitalization, but may require further drainage in the future if re-accumulation.  Significant Procedures: Right-sided thoracentesis 6/2, left-sided thoracentesis 6/3  Significant Labs and Imaging:  Recent Labs  Lab 10/28/18 0232 10/29/18 0211 10/30/18 0311  WBC 6.7 6.2 6.0  HGB 7.7* 7.2* 7.3*  HCT 23.5* 23.2* 22.6*  PLT 97* 92* 93*   Recent Labs  Lab 10/26/18 1921 10/27/18 0016 10/27/18 0859 10/28/18 0232 10/29/18 0211 10/30/18 0311  NA 141  --  141 139 137 135  K 2.9*  --  3.6 3.3* 3.7 3.8  CL 102  --  102 101 99 97*  CO2 24  --  _0 GLUCOSE 102*  --  92 100* 84 95  BUN 21*  --  _1 CREATININE 0.95  --  1.04* 0.88 0.96 0.78  CALCIUM 9.8  --  9.9 10.3 10.1 10.1  MG  --  2.1  --   --   --   --   ALKPHOS  --  379* 367*  --   --   --   AST  --  41  38  --   --   --   ALT  --  25 24  --   --   --   ALBUMIN  --  2.7* 2.8*  --   --   --    Dg Chest 1 View  Result Date: 10/29/2018 CLINICAL DATA:  Post LEFT thoracentesis EXAM: CHEST  1 VIEW COMPARISON:  622 in 20 FINDINGS: Upper normal heart size. Stable mediastinal contours. BILATERAL upper lobe scarring. Improved aeration at bases since prior study. Minimal residual LEFT pleural effusion without pneumothorax. Bones demineralized. IMPRESSION: Extensive chronic lung changes. No pneumothorax following LEFT thoracentesis. Electronically Signed   By: Lavonia Dana M.D.   On: 10/29/2018 15:50   Ct Head W & Wo Contrast  Result Date: 10/30/2018 CLINICAL DATA:  Metastatic breast cancer. No neurologic symptoms are reported. EXAM: CT HEAD WITHOUT AND WITH CONTRAST TECHNIQUE: Contiguous axial images were obtained from the base of the skull through the vertex without and with intravenous contrast CONTRAST:  36m ISOVUE-300 IOPAMIDOL (ISOVUE-300) INJECTION 61% COMPARISON:  Nuclear medicine whole-body bone scan, 10/27/2018 FINDINGS: Brain: No acute stroke, hemorrhage, mass lesion or hydrocephalus. Mild atrophy. No extra-axial fluid. Post infusion, no abnormal enhancement of the brain or visible meninges. Vascular: No hyperdense vessel or unexpected calcification. Visible vessels are patent. Skull: Widespread metastatic disease to  the calvarium, skull base, and visible cervical spine. Metastatic disease to the mandible is also present. There is destruction of the inner and outer table of the skull in multiple locations. Epidural tumor is not established. Sinuses/Orbits: No acute finding. Other: None. IMPRESSION: 1. Widespread osseous metastatic disease to the calvarium, skull base, and visible cervical spine. Epidural tumor not established. 2. No intracranial metastatic disease is evident. Electronically Signed   By: Staci Righter M.D.   On: 10/30/2018 09:50   Ir Thoracentesis Asp Pleural Space W/img Guide  Result  Date: 10/29/2018 INDICATION: Patient with history of metastatic disease on imaging, dyspnea, and bilateral pleural effusions. Request is made for therapeutic left thoracentesis. EXAM: ULTRASOUND GUIDED THERAPEUTIC LEFT THORACENTESIS MEDICATIONS: 10 mL 1% lidocaine COMPLICATIONS: None immediate. PROCEDURE: An ultrasound guided thoracentesis was thoroughly discussed with the patient and questions answered. The benefits, risks, alternatives and complications were also discussed. The patient understands and wishes to proceed with the procedure. Written consent was obtained. Ultrasound was performed to localize and mark an adequate pocket of fluid in the left chest. The area was then prepped and draped in the normal sterile fashion. 1% Lidocaine was used for local anesthesia. Under ultrasound guidance a 6 Fr Safe-T-Centesis catheter was introduced. Thoracentesis was performed. The catheter was removed and a dressing applied. FINDINGS: A total of approximately 350 mL of hazy gold fluid was removed. IMPRESSION: Successful ultrasound guided left thoracentesis yielding 350 mL of pleural fluid. Read by: Earley Abide, PA-C Electronically Signed   By: Jerilynn Mages.  Shick M.D.   On: 10/29/2018 15:58    Ct Chest W Contrast  Result Date: 10/26/2018 CLINICAL DATA:  Shortness of breath, possible metastatic disease. Breast cancer. EXAM: CT CHEST WITH CONTRAST TECHNIQUE: Multidetector CT imaging of the chest was performed during intravenous contrast administration. CONTRAST:  32m OMNIPAQUE IOHEXOL 300 MG/ML  SOLN COMPARISON:  Chest x-ray earlier today FINDINGS: Cardiovascular: Mild cardiomegaly. Moderate coronary artery calcifications in the left anterior descending coronary artery. Aorta is normal caliber. Scattered atherosclerotic change. Mediastinum/Nodes: Numerous borderline sized and mildly enlarged mediastinal lymph nodes. Right paratracheal node measures 12 mm. Borderline size bilateral hilar lymph nodes, the largest on the right  measuring 11 mm in short axis diameter. Numerous enlarged bilateral axillary lymph nodes with index left axillary lymph node having a short axis diameter of 2.1 cm on image 21 and right axillary lymph node having a short axis diameter of 1.4 cm on image 48. Lungs/Pleura: Moderate bilateral pleural effusions. Extensive interstitial disease throughout the lungs bilaterally. Mild reticulonodular densities. Cannot exclude lymphangitic spread of tumor. Upper Abdomen: Gallstones noted within the gallbladder. Musculoskeletal: Chest wall soft tissues are unremarkable. Extensive diffuse mixed lytic and sclerotic lesions throughout the visualized skeleton compatible with diffuse skeletal metastases. IMPRESSION: Bilateral axillary and mediastinal adenopathy. Borderline hilar lymph nodes bilaterally. Findings likely reflect metastatic lymphadenopathy. Moderate bilateral pleural effusions. Extensive interstitial thickening and reticulonodular opacities throughout the lungs. Cannot exclude lymphangitic spread of tumor. Diffuse skeletal metastases. Cholelithiasis. Coronary artery disease. Aortic Atherosclerosis (ICD10-I70.0). Electronically Signed   By: KRolm BaptiseM.D.   On: 10/26/2018 22:54   Nm Bone Scan Whole Body  Result Date: 10/27/2018 CLINICAL DATA:  Breast cancer staging. EXAM: NUCLEAR MEDICINE WHOLE BODY BONE SCAN TECHNIQUE: Whole body anterior and posterior images were obtained approximately 3 hours after intravenous injection of radiopharmaceutical. RADIOPHARMACEUTICALS:  20.6 mCi Technetium-955mDP IV COMPARISON:  CT scan Oct 26, 2018 FINDINGS: Bony metastatic disease is seen in both of the femurs, numerous ribs, the manubrium, and  right humerus. The widespread bony metastatic disease in the scapula, thoracic spine, sternum, and proximal humeri is better appreciated on the comparison CT scan. IMPRESSION: Widespread bony metastatic disease. The extent of metastatic disease is actually better appreciated on the  recent CT imaging. This study is a near super scan consistent with widespread bony metastatic disease. Electronically Signed   By: Dorise Bullion III M.D   On: 10/27/2018 16:01   Dg Chest Portable 1 View  Result Date: 10/26/2018 CLINICAL DATA:  Shortness of breath, possible right breast cancer EXAM: PORTABLE CHEST 1 VIEW COMPARISON:  None. FINDINGS: Low lung volumes. No pleural effusion. Diffuse interstitial opacity. Heart size upper limits of normal. No pneumothorax. Diffuse sclerosis and lucent lesions within the clavicles, ribs and shoulders. IMPRESSION: 1. Low lung volumes without consolidation or effusion. Diffuse bilateral interstitial opacity of unknown chronicity. Findings could be secondary to interstitial inflammatory process, atypical/viral pneumonia, or possible metastatic disease given clinical history. CT chest suggested for further evaluation 2. 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   On: 10/26/2018 20:11   Results/Tests Pending at Time of Discharge: Breast Prognostic Panel  Discharge Medications:  Allergies as of 10/30/2018   No Known Allergies     Medication List    TAKE these medications   anastrozole 1 MG tablet Commonly known as:  ARIMIDEX Take 1 tablet (1 mg total) by mouth daily. Start taking on:  October 31, 2018   clotrimazole 1 % cream Commonly known as:  LOTRIMIN Apply topically 2 (two) times daily. For 2-4 weeks.   oxyCODONE 5 MG immediate release tablet Commonly known as:  Oxy IR/ROXICODONE Take 0.5 tablets (2.5 mg total) by mouth every 4 (four) hours as needed for moderate pain or severe pain.       Discharge Instructions: Please refer to Patient Instructions section of EMR for full details.  Patient was counseled important signs and symptoms that should prompt return to medical care, changes in medications, dietary  instructions, activity restrictions, and follow up appointments.   Follow-Up Appointments: Follow-up [facility] Follow up on 11/06/2018.   Why:  1:50 PM with Freeman Caldron NP Contact information: [place] [id number] [phone]       [name], Virgie Dad, MD. Go on 10/31/2018.   Specialty:  Oncology Why:  at 9:30 am Contact information: [place] [address] [phone]           Cleophas Dunker, DO 10/30/2018, 1:01 PM PGY-1, [place]

## 2018-10-29 NOTE — Progress Notes (Signed)
I responded to a request to provide Advance Directive information for the patient. I visited the patient's room and provided an overview of the AD; she had no questions. I left a copy for her to review and complete at a later time. I shared words of encouragement.    10/29/18 1300  Clinical Encounter Type  Visited With Patient  Visit Type Spiritual support  Referral From Nurse  Consult/Referral To Chaplain  Spiritual Encounters  Spiritual Needs Prayer;Literature    Chaplain Dr Redgie Grayer

## 2018-10-29 NOTE — Progress Notes (Addendum)
Family Medicine Teaching Service Daily Progress Note Intern Pager: [phone]  Patient name: Suzanne [name] Medical record number: [id number] Date of birth: December 28, 1960 Age: 58 y.o. Gender: female  Primary Care Provider: Patient, No Pcp Per Consultants: None Code Status: Full  Pt Overview and Major Events to Date:  5/31 Admitted to [facility] 6/2 Right sided thoracentesis   Assessment and Plan: Suzanne [name] is a 58 y.o. female presenting with worsening SOB and found to have Hgb 4.5. No significant PMH.   Acute Right Breast Changes  Concern for new metastatic disease of undetermined primary: Patient with new area with thickening of the right breast and intermittent bloody discharge noted on exam.  CT with extensive interstitial thickening and lung opacities.  Also with bilateral moderate pleural effusions, s/p right sided thoracentesis suggesting exudative process, likely malignant.  CA 27.29 elevated to 1810.  Blood smear with nucleated RBCs indicating BM infiltration.  Dr. Jana Hakim has discussed with patient likely stage IV breast cancer.  She has an appointment tentatively scheduled in his office on 6/5 at 9:30 AM.  Patient needs CT head for staging, she has been refusing.  Now that patient has had thoracentesis, hopefully she is able to lie flat today.   -CT head for staging -Follow-up thoracentesis cytology, breast prognostic panel -Follow-up oncology, appreciate recommendations -Anastrozole per oncology  Dyspnea: Improving.  Likely multifactorial from symptomatic anemia and newly discovered possible metastatic disease Hemoglobin remained stable at 7.2 s/p 2 units pRBC.  Right-sided Thoracentesis performed on 6/2, suggesting exudative process, increasing likelihood of malignant pleural effusions.  On 1L per [place] overnight.  This AM she states her breathing is much improved, on 1 L. - PT/OT eval: HH PT -Can consider therapeutic thoracentesis of left side 24 hours after thoracentesis of  right - consider palliative consult if indicated - cytology pending  Acute Severe Anemia: Hemoglobin remained stable at 7.2 s/p 2 units pRBC.  FOBT positive.  GI consulted on 6/1 and noted that they would wait for oncology work-up to be finished to feel if they need colonoscopy or EGD.  Per Dr. Jana Hakim, anemia likely 2/2 infiltrated bone marrow from cancer, but could also have GI bleed given positive FOBT.  He states that he will likely not perform PET scan as is not needed given other imaging at present for staging.    - S/p 2 units pRBC in ED - transfusion threshold <7 - GI consulted, appreciate recommendations -advance to regular diet given no GI workup indicated at this time - SCDs given possible acute bleed  Possible Right Clavicular Fracture: Unlikely Patient endorsed right clavicular pain overnight again.  She states that it improved with oxycodone, Tylenol, ice packs.  This a.m., states the pain is improved.  On exam, right clavicle remains without visible or palpable defect, no tenderness to palpation of length of clavicle, therefore highly doubt fracture.  It is likely that patient is having bone pain secondary to metastatic cancer. - oncology plan per below - ice - consider ortho consult if clavicular pain worsens or there is concern for fracture on exam - oxy 2.5mg  q4h prn for bone pain given likely cancer and bone pain  Hypokalemia: Resolved K 3.7 this a.m. -Follow-up BMP  Elevated Alkaline Phosphatase: Cholelithiasis noted on CT. Denies any RUQ pain, although currently experiencing right breast pain that may be shielding symptoms. Denies any nausea or vomiting. AST/ALT and bilirubin levels WNL.  Likely secondary to bone turnover given bony metastasis.  New onset LE edema  Bilateral  Pleural effusion Exam with 2+ pleural effusions and bilateral pleural effusions on imaging.  Echo with EF 60 to 29%, diastolic function could not be determined.  Bilateral pleural effusions  likely malignant in nature, s/p right thoracentesis on 6/2. -Plan per above  Inframammary Candidal Intertrigo: Severe intertrigo along bilateral inframammary folds.  - Clotrimazole BID x 2-4 weeks  Atherosclerosis/CAD: Aortic atherosclerosis and moderate coronary artery calcifications in the left anterior descending coronary artery on CT scan.  No known history of CAD.  EKG without acute ischemic changes.  Patient remains without chest pain -Continue to monitor  FEN/GI: Clear liquids pending GI work-up PPx: SCDs given anemia  Disposition: HHPT/OT, orders in, likely today  Subjective:  Patient states that he is feeling much better this a.m.  She states that her breathing greatly improved following thoracentesis.  She states she would be amenable to CT head today.  She states her pain is well controlled on her current regimen.  She did have some right clavicular pain overnight, which resolved.  Objective: Temp:  [98.5 F (36.9 C)-98.9 F (37.2 C)] 98.9 F (37.2 C) (06/03 0540) Pulse Rate:  [97-100] 97 (06/03 0540) Resp:  [18] 18 (06/02 1342) BP: (133-154)/(78-88) 154/78 (06/03 0540) SpO2:  [96 %-100 %] 97 % (06/03 0540) Weight:  [106.8 kg] 106.8 kg (06/03 0500)  Physical Exam:  General: 58 y.o. female in NAD Cardio: RRR no m/r/g Lungs: Bibasilar crackles on left, right lung clear, no IWOB on 1 L Abdomen: Soft, non-tender to palpation, non-distended, positive bowel sounds Skin: warm and dry Extremities: 2+ bilateral lower extremity pitting edema, right clavicle without visible or palpable abnormalities, no tenderness to palpation of length of clavicle     Laboratory: Recent Labs  Lab 10/27/18 0859 10/28/18 0232 10/29/18 0211  WBC 7.4 6.7 6.2  HGB 7.3* 7.7* 7.2*  HCT 22.5* 23.5* 23.2*  PLT 97* 97* 92*   Recent Labs  Lab 10/27/18 0016 10/27/18 0859 10/28/18 0232 10/29/18 0211  NA  --  141 139 137  K  --  3.6 3.3* 3.7  CL  --  102 101 99  CO2  --  28 25 26    BUN  --  17 17 17   CREATININE  --  1.04* 0.88 0.96  CALCIUM  --  9.9 10.3 10.1  PROT 6.0* 6.1*  --   --   BILITOT 1.2 1.9*  --   --   ALKPHOS 379* 367*  --   --   ALT 25 24  --   --   AST 41 38  --   --   GLUCOSE  --  92 100* 84    Imaging/Diagnostic Tests: Dg Chest 1 View  Result Date: 10/28/2018 CLINICAL DATA:  Post right thoracentesis EXAM: CHEST  1 VIEW COMPARISON:  10/28/2018 FINDINGS: Very low lung volumes again noted. Interstitial prominence within the lungs. Heart is borderline in size with vascular congestion. No pneumothorax following right thoracentesis. Diffuse skeletal metastases again noted. IMPRESSION: Very low lung volumes with cardiomegaly, vascular congestion and interstitial prominence. No pneumothorax following thoracentesis. Electronically Signed   By: Rolm Baptise M.D.   On: 10/28/2018 16:00   Ir Thoracentesis Asp Pleural Space W/img Guide  Result Date: 10/28/2018 INDICATION: Patient with history of metastatic disease (including bone metastases) on imaging, dyspnea, and bilateral pleural effusions (R>L). Request is made for diagnostic and therapeutic right thoracentesis. EXAM: ULTRASOUND GUIDED DIAGNOSTIC AND THERAPEUTIC RIGHT THORACENTESIS MEDICATIONS: 10 mL 1% lidocaine COMPLICATIONS: None immediate. PROCEDURE: An ultrasound guided  thoracentesis was thoroughly discussed with the patient and questions answered. The benefits, risks, alternatives and complications were also discussed. The patient understands and wishes to proceed with the procedure. Written consent was obtained. Ultrasound was performed to localize and mark an adequate pocket of fluid in the right chest. The area was then prepped and draped in the normal sterile fashion. 1% Lidocaine was used for local anesthesia. Under ultrasound guidance a 6 Fr Safe-T-Centesis catheter was introduced. Thoracentesis was performed. The catheter was removed and a dressing applied. FINDINGS: A total of approximately 600 mL of  clear gold fluid was removed. Samples were sent to the laboratory as requested by the clinical team. IMPRESSION: Successful ultrasound guided right thoracentesis yielding 600 mL of pleural fluid. Read by: Earley Abide, PA-C Electronically Signed   By: Jerilynn Mages.  Shick M.D.   On: 10/28/2018 16:10    Fraser, Bernita Raisin, DO 10/29/2018, 7:38 AM PGY-1, [place] Intern pager: [phone], text pages welcome

## 2018-10-30 ENCOUNTER — Inpatient Hospital Stay (HOSPITAL_COMMUNITY): Payer: Medicaid Other

## 2018-10-30 DIAGNOSIS — T1490XA Injury, unspecified, initial encounter: Secondary | ICD-10-CM

## 2018-10-30 LAB — BASIC METABOLIC PANEL
Anion gap: 11 (ref 5–15)
BUN: 10 mg/dL (ref 6–20)
CO2: 27 mmol/L (ref 22–32)
Calcium: 10.1 mg/dL (ref 8.9–10.3)
Chloride: 97 mmol/L — ABNORMAL LOW (ref 98–111)
Creatinine, Ser: 0.78 mg/dL (ref 0.44–1.00)
GFR calc Af Amer: >60 mL/min (ref >60)
GFR calc non Af Amer: >60 mL/min (ref >60)
Glucose, Bld: 95 mg/dL (ref 70–99)
Potassium: 3.8 mmol/L (ref 3.5–5.1)
Sodium: 135 mmol/L (ref 135–145)

## 2018-10-30 LAB — CBC
HCT: 22.6 % — ABNORMAL LOW (ref 36.0–46.0)
Hemoglobin: 7.3 g/dL — ABNORMAL LOW (ref 12.0–15.0)
MCH: 32.6 pg (ref 26.0–34.0)
MCHC: 32.3 g/dL (ref 30.0–36.0)
MCV: 100.9 fL — ABNORMAL HIGH (ref 80.0–100.0)
Platelets: 93 10*3/uL — ABNORMAL LOW (ref 150–400)
RBC: 2.24 MIL/uL — ABNORMAL LOW (ref 3.87–5.11)
RDW: 21.2 % — ABNORMAL HIGH (ref 11.5–15.5)
WBC: 6 10*3/uL (ref 4.0–10.5)
nRBC: 2.8 % — ABNORMAL HIGH (ref 0.0–0.2)

## 2018-10-30 MED ORDER — CLOTRIMAZOLE 1 % EX CREA: TOPICAL_CREAM | Freq: Two times a day (BID) | CUTANEOUS | 0 refills | Status: DC

## 2018-10-30 MED ORDER — ANASTROZOLE 1 MG PO TABS: 1.0000 mg | ORAL_TABLET | Freq: Every day | ORAL | 0 refills | Status: DC

## 2018-10-30 MED ORDER — OXYCODONE HCL 5 MG PO TABS: 2.5000 mg | ORAL_TABLET | ORAL | 0 refills | Status: DC | PRN

## 2018-10-30 MED ORDER — IOPAMIDOL (ISOVUE-300) INJECTION 61%
75.0000 mL | Freq: Once | INTRAVENOUS | Status: AC | PRN
  Administered 2018-10-30: 75 mL via INTRAVENOUS

## 2018-10-30 NOTE — Plan of Care (Signed)
  Problem: Education: Goal: Knowledge of General Education information will improve Description Including pain rating scale, medication(s)/side effects and non-pharmacologic comfort measures Outcome: Completed/Met   Problem: Health Behavior/Discharge Planning: Goal: Ability to manage health-related needs will improve Outcome: Completed/Met   Problem: Clinical Measurements: Goal: Ability to maintain clinical measurements within normal limits will improve Outcome: Completed/Met Goal: Will remain free from infection Outcome: Completed/Met Goal: Diagnostic test results will improve Outcome: Completed/Met Goal: Respiratory complications will improve Outcome: Completed/Met Goal: Cardiovascular complication will be avoided Outcome: Completed/Met   Problem: Activity: Goal: Risk for activity intolerance will decrease Outcome: Completed/Met   Problem: Nutrition: Goal: Adequate nutrition will be maintained Outcome: Completed/Met   Problem: Coping: Goal: Level of anxiety will decrease Outcome: Completed/Met   Problem: Elimination: Goal: Will not experience complications related to bowel motility Outcome: Completed/Met Goal: Will not experience complications related to urinary retention Outcome: Completed/Met   Problem: Pain Managment: Goal: General experience of comfort will improve Outcome: Completed/Met   Problem: Safety: Goal: Ability to remain free from injury will improve Outcome: Completed/Met   Problem: Skin Integrity: Goal: Risk for impaired skin integrity will decrease Outcome: Completed/Met

## 2018-10-30 NOTE — Progress Notes (Addendum)
Family Medicine Teaching Service Daily Progress Note Intern Pager: [phone]  Patient name: Suzanne [name] Medical record number: [id number] Date of birth: 1961/02/10 Age: 59 y.o. Gender: female  Primary Care Provider: Patient, No Pcp Per Consultants: None Code Status: Full  Pt Overview and Major Events to Date:  5/31 Admitted to [facility] 6/2 Right sided thoracentesis   Assessment and Plan: Suzanne [name] is a 58 y.o. female presenting with worsening SOB and found to have Hgb 4.5. No significant PMH.   Acute Right Breast Changes  Concern for new metastatic disease of undetermined primary: Patient with new area with thickening of the right breast and intermittent bloody discharge noted on exam.  CT with extensive interstitial thickening and lung opacities.  Also with bilateral moderate pleural effusions, s/p right sided thoracentesis suggesting exudative process, likely malignant.  CA 27.29 elevated to 1810.  CT head was not performed yesterday, called this AM to confirm timing.  Planning for discharge today after CT head is obtained and f/u with Heme/Onc in clinic on 6/5. -CT head for staging -Follow-up thoracentesis cytology, breast prognostic panel -Follow-up oncology, appreciate recommendations -Anastrozole per oncology  Dyspnea: Improving.  Likely multifactorial from symptomatic anemia and newly discovered possible metastatic disease Hemoglobin remained stable at 7.3.  Right-sided Thoracentesis performed on 6/2, suggesting exudative process, increasing likelihood of malignant pleural effusions.  Patient also received therapeutic thoracentesis of left lung on 6/3 with improvement in breathing.  Patient on 2 L of nasal cannula this a.m, able to be weaned to 1 L on exam. - PT/OT eval: HH PT/OT - cytology with breast prognostic panel pending -Ambulate with pulse ox  Acute Severe Anemia: Hemoglobin remained stable at 7.3 s/p 2 units pRBC during hospitalization.  FOBT positive.  GI  consulted on 6/1 and noted that they would wait for oncology work-up to be finished to feel if they need colonoscopy or EGD.  Per Dr. Jana Hakim, anemia likely 2/2 infiltrated bone marrow from cancer, but could also have GI bleed given positive FOBT.  He states that he will likely not perform PET scan as is not needed given other imaging at present for staging.  Given this patient's hemoglobin now remained stable, would not recommend GI work-up while inpatient.  Will refer patient to GI on discharge.    - S/p 2 units pRBC in ED - transfusion threshold <7 - Will refer to GI on discharge - SCDs given possible acute bleed  Right Clavicular Pain Suspect bone pain from metastasis.  Patient given a sling yesterday for comfort.  Patient continues to note intermittent pain in both arms now.  Pain likely secondary to bony metastasis. - oncology plan per below - ice - consider ortho consult if clavicular pain worsens or there is concern for fracture on exam - oxy 2.5mg  q4h prn for bone pain given likely cancer and bone pain  Elevated Alkaline Phosphatase: Cholelithiasis noted on CT. Denies any RUQ pain, although currently experiencing right breast pain that may be shielding symptoms. Denies any nausea or vomiting. AST/ALT and bilirubin levels WNL.  Likely secondary to bone turnover given bony metastasis.  New onset LE edema  Bilateral Pleural effusion Exam with 2+ pleural effusions and bilateral pleural effusions on imaging.  Echo with EF 60 to 83%, diastolic function could not be determined.  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 -Plan per above  Inframammary Candidal Intertrigo: Severe intertrigo along bilateral inframammary folds.  - Clotrimazole BID x 2-4 weeks  Atherosclerosis/CAD:  Aortic atherosclerosis and moderate coronary artery calcifications in the left anterior descending coronary artery on CT scan.  No known history of CAD.  EKG without  acute ischemic changes.  Patient remains without chest pain -Continue to monitor  FEN/GI: regular diet PPx: SCDs given anemia  Disposition: HHPT/OT, orders in, d/c today  Subjective:  Patient states that she is feeling well this morning.  She states "I can breathe now."  She endorses that she has bone pain in her right and left arms now.  Denies any other complaints at this time.  States that she would like to go home today.  Objective: Temp:  [98.2 F (36.8 C)-99.3 F (37.4 C)] 99.3 F (37.4 C) (06/04 0510) Pulse Rate:  [95-111] 111 (06/04 0510) Resp:  [16-18] 18 (06/04 0510) BP: (132-146)/(69-77) 133/77 (06/04 1191) SpO2:  [97 %-98 %] 98 % (06/04 4782) Weight:  [106.2 kg] 106.2 kg (06/04 0514)  Physical Exam:  General: 58 y.o. female in NAD Cardio: RRR no m/r/g Lungs: CTAB, no wheezing, no rhonchi, no crackles, no IWOB on 1L Abdomen: Soft, non-tender to palpation, non-distended, positive bowel sounds Skin: warm and dry Extremities: Trace to no edema BLE    Laboratory: Recent Labs  Lab 10/28/18 0232 10/29/18 0211 10/30/18 0311  WBC 6.7 6.2 6.0  HGB 7.7* 7.2* 7.3*  HCT 23.5* 23.2* 22.6*  PLT 97* 92* 93*   Recent Labs  Lab 10/27/18 0016 10/27/18 0859 10/28/18 0232 10/29/18 0211 10/30/18 0311  NA  --  141 139 137 135  K  --  3.6 3.3* 3.7 3.8  CL  --  102 101 99 97*  CO2  --  28 25 26 27   BUN  --  17 17 17 10   CREATININE  --  1.04* 0.88 0.96 0.78  CALCIUM  --  9.9 10.3 10.1 10.1  PROT 6.0* 6.1*  --   --   --   BILITOT 1.2 1.9*  --   --   --   ALKPHOS 379* 367*  --   --   --   ALT 25 24  --   --   --   AST 41 38  --   --   --   GLUCOSE  --  92 100* 84 95    Imaging/Diagnostic Tests: Dg Chest 1 View  Result Date: 10/29/2018 CLINICAL DATA:  Post LEFT thoracentesis EXAM: CHEST  1 VIEW COMPARISON:  622 in 20 FINDINGS: Upper normal heart size. Stable mediastinal contours. BILATERAL upper lobe scarring. Improved aeration at bases since prior study. Minimal  residual LEFT pleural effusion without pneumothorax. Bones demineralized. IMPRESSION: Extensive chronic lung changes. No pneumothorax following LEFT thoracentesis. Electronically Signed   By: Lavonia Dana M.D.   On: 10/29/2018 15:50   Ir Thoracentesis Asp Pleural Space W/img Guide  Result Date: 10/29/2018 INDICATION: Patient with history of metastatic disease on imaging, dyspnea, and bilateral pleural effusions. Request is made for therapeutic left thoracentesis. EXAM: ULTRASOUND GUIDED THERAPEUTIC LEFT THORACENTESIS MEDICATIONS: 10 mL 1% lidocaine COMPLICATIONS: None immediate. PROCEDURE: An ultrasound guided thoracentesis was thoroughly discussed with the patient and questions answered. The benefits, risks, alternatives and complications were also discussed. The patient understands and wishes to proceed with the procedure. Written consent was obtained. Ultrasound was performed to localize and mark an adequate pocket of fluid in the left chest. The area was then prepped and draped in the normal sterile fashion. 1% Lidocaine was used for local anesthesia. Under ultrasound guidance a 6 Fr Safe-T-Centesis catheter  was introduced. Thoracentesis was performed. The catheter was removed and a dressing applied. FINDINGS: A total of approximately 350 mL of hazy gold fluid was removed. IMPRESSION: Successful ultrasound guided left thoracentesis yielding 350 mL of pleural fluid. Read by: Earley Abide, PA-C Electronically Signed   By: Jerilynn Mages.  Shick M.D.   On: 10/29/2018 15:58    No Name, Bernita Raisin, DO 10/30/2018, 8:21 AM PGY-1, [place] Intern pager: [phone], text pages welcome

## 2018-10-30 NOTE — Progress Notes (Signed)
CSW received request for taxi as patient is unable to ride the bus. Voucher provided on chart.   Percell Locus Milley Vining LCSW [phone]

## 2018-10-30 NOTE — Discharge Instructions (Signed)
You came into the hospital because you were having trouble breathing.  You were found to have fluid in your lungs, which was drained, and low blood levels, for which you received a blood transfusion.  Should she start to have fatigue, shortness of breath, chest pain you should be seen right away.  Please continue to follow-up with Dr. Jana Hakim and follow-up with community health and wellness to establish care.  Anemia  Anemia is a condition in which you do not have enough red blood cells or hemoglobin. Hemoglobin is a substance in red blood cells that carries oxygen. When you do not have enough red blood cells or hemoglobin (are anemic), your body cannot get enough oxygen and your organs may not work properly. As a result, you may feel very tired or have other problems. What are the causes? Common causes of anemia include:  Excessive bleeding. Anemia can be caused by excessive bleeding inside or outside the body, including bleeding from the intestine or from periods in women.  Poor nutrition.  Long-lasting (chronic) kidney, thyroid, and liver disease.  Bone marrow disorders.  Cancer and treatments for cancer.  HIV (human immunodeficiency virus) and AIDS (acquired immunodeficiency syndrome).  Treatments for HIV and AIDS.  Spleen problems.  Blood disorders.  Infections, medicines, and autoimmune disorders that destroy red blood cells. What are the signs or symptoms? Symptoms of this condition include:  Minor weakness.  Dizziness.  Headache.  Feeling heartbeats that are irregular or faster than normal (palpitations).  Shortness of breath, especially with exercise.  Paleness.  Cold sensitivity.  Indigestion.  Nausea.  Difficulty sleeping.  Difficulty concentrating. Symptoms may occur suddenly or develop slowly. If your anemia is mild, you may not have symptoms. How is this diagnosed? This condition is diagnosed based on:  Blood tests.  Your medical history.  A  physical exam.  Bone marrow biopsy. Your health care provider may also check your stool (feces) for blood and may do additional testing to look for the cause of your bleeding. You may also have other tests, including:  Imaging tests, such as a CT scan or MRI.  Endoscopy.  Colonoscopy. How is this treated? Treatment for this condition depends on the cause. If you continue to lose a lot of blood, you may need to be treated at a hospital. Treatment may include:  Taking supplements of iron, vitamin F74, or folic acid.  Taking a hormone medicine (erythropoietin) that can help to stimulate red blood cell growth.  Having a blood transfusion. This may be needed if you lose a lot of blood.  Making changes to your diet.  Having surgery to remove your spleen. Follow these instructions at home:  Take over-the-counter and prescription medicines only as told by your health care provider.  Take supplements only as told by your health care provider.  Follow any diet instructions that you were given.  Keep all follow-up visits as told by your health care provider. This is important. Contact a health care provider if:  You develop new bleeding anywhere in the body. Get help right away if:  You are very weak.  You are short of breath.  You have pain in your abdomen or chest.  You are dizzy or feel faint.  You have trouble concentrating.  You have bloody or black, tarry stools.  You vomit repeatedly or you vomit up blood. Summary  Anemia is a condition in which you do not have enough red blood cells or enough of a substance in your  red blood cells that carries oxygen (hemoglobin).  Symptoms may occur suddenly or develop slowly.  If your anemia is mild, you may not have symptoms.  This condition is diagnosed with blood tests as well as a medical history and physical exam. Other tests may be needed.  Treatment for this condition depends on the cause of the anemia. This  information is not intended to replace advice given to you by your health care provider. Make sure you discuss any questions you have with your health care provider. Document Released: 06/21/2004 Document Revised: 06/15/2016 Document Reviewed: 06/15/2016 Elsevier Interactive Patient Education  2019 [employer].

## 2018-10-30 NOTE — Progress Notes (Signed)
Nsg Discharge Note  Admit Date:  10/26/2018 Discharge date: 10/30/2018   [name] to be D/C'd Home per MD order.  AVS completed.  Copy for chart, and copy for patient signed, and dated. Patient/caregiver able to verbalize understanding.  Discharge Medication: Allergies as of 10/30/2018   No Known Allergies     Medication List    TAKE these medications   anastrozole 1 MG tablet Commonly known as:  ARIMIDEX Take 1 tablet (1 mg total) by mouth daily. Start taking on:  October 31, 2018   clotrimazole 1 % cream Commonly known as:  LOTRIMIN Apply topically 2 (two) times daily. For 2-4 weeks.   oxyCODONE 5 MG immediate release tablet Commonly known as:  Oxy IR/ROXICODONE Take 0.5 tablets (2.5 mg total) by mouth every 4 (four) hours as needed for moderate pain or severe pain.            Durable Medical Equipment  (From admission, onward)         Start     Ordered   10/30/18 1515  For home use only DME 3 n 1  Once     10/30/18 1515   10/30/18 1514  For home use only DME Walker rolling  Once    Question:  Patient needs a walker to treat with the following condition  Answer:  Weakness   10/30/18 1514          Discharge Assessment: Vitals:   10/30/18 0608 10/30/18 1313  BP: 133/77 (!) 169/76  Pulse:  (!) 112  Resp:  20  Temp:  98 F (36.7 C)  SpO2: 98% 91%   Skin clean, dry and intact without evidence of skin break down, no evidence of skin tears noted. IV catheter discontinued intact. Site without signs and symptoms of complications - no redness or edema noted at insertion site, patient denies c/o pain - only slight tenderness at site.  Dressing with slight pressure applied.  D/c Instructions-Education: Discharge instructions given to patient/family with verbalized understanding. D/c education completed with patient/family including follow up instructions, medication list, d/c activities limitations if indicated, with other d/c instructions as indicated by MD -  patient able to verbalize understanding, all questions fully answered. Patient instructed to return to ED, call 911, or call MD for any changes in condition.  Patient escorted via [place], and D/C home via private auto.  Salley Slaughter, RN 10/30/2018 6:37 PM

## 2018-10-30 NOTE — TOC Transition Note (Addendum)
Transition of Care [facility]) - CM/SW Discharge Note   Patient Details  Name: SHAMIAH KAHLER MRN: [id number] Date of Birth: 1960/11/03  [facility]) CM/SW Contact:  Sharin Mons, RN Phone Number: 10/30/2018, 2:49 PM   Clinical Narrative:     Presented with SOB/ R breast changes. Resides with daughter who has  mildcerebral palsy. PTA independent with ADL's , no DME usage.  PLAN:Transition to home with home health services (RN,PT,OT), charity care approval with [facility].  Pt without insurance, no PCP.  Hospital f/u and establishment of PCP, appointment made with [facility], noted on AVS. Rolling walker, BSC will be delivered to bedside prior to discharge, charity case with [employer]. Pt with scheduled appointment with [facility] on tomorrow, noted on AVS.  Maisha Bogen (Daughter)     [phone]      States needs transportation to home ....  CSW to provided to pt with Taxi voucher.    Barriers to Discharge: Barriers Resolved   Patient Goals and CMS Choice   CMS Medicare.gov Compare Post Acute Care list provided to:: Patient    Discharge Placement                       Discharge Plan and Services In-house Referral: Financial Counselor Discharge Planning Services: CM Consult            DME Arranged: Gilford Rile rolling, [facility] DME Agency: [employer] Date DME Agency Contacted: 10/30/18 Time DME Agency Contacted: [phone] Representative spoke with at DME Agency: zack HH Arranged: RN, PT, OT [place] Agency: (charity care home health services pending , awaiting approval from [facility]) NCM received call from [facility] informing approval for home health services. Date HH Agency Contacted: 10/30/18 Time [place]: 0932 Representative spoke with at [employer]: [place] ([place]) Interventions     Readmission Risk Interventions No flowsheet data found.

## 2018-10-30 NOTE — Progress Notes (Signed)
SATURATION QUALIFICATIONS: (This note is used to comply with regulatory documentation for home oxygen)  Patient Saturations on Room Air at Rest = 96%  Patient Saturations on Room Air while Ambulating = 92%  Patient Saturations on 2 Liters of oxygen while Ambulating = 97%  Patient walked 190 feet. 95 feet with 02 and 95 without 02

## 2018-10-30 NOTE — Progress Notes (Signed)
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  The patient presents to the hospital with a 3-week history of worsening shortness of breath and lower extremity edema.  The patient reported that her shortness of breath worsened with exertion.  Her lower extremity edema did not improve with elevation.  She also had an intermittent productive cough with rare sputum production.  The patient was noted to be anemic with a hemoglobin of 4.5 with a clumped platelets noted on admission.  Stool for occult blood was positive.  Patient received 2 units of packed red blood cells.  The patient reported a rash under her breast x1 week which has been foul-smelling.  She also reported that she has some discomfort and thickening/hardness of her right breast for several months.  She states that her right breast is much larger than her left.  She notes some occasional bloody discharge from her breast once in a while.  A chest x-ray on admission showed diffuse bilateral interstitial opacity of unknown chronicity, findings could be related to interstitial inflammatory process, atypical/viral pneumonia, or possible metastatic disease.  There are also sclerotic and lytic lesions within the clavicles, bilateral ribs, shoulders which was concerning for diffuse few skeletal metastatic disease.  This prompted a CT scan of the chest with contrast  which showed bilateral axillary and mediastinal adenopathy, borderline hilar lymph nodes bilaterally, findings likely flecked metastatic lymphadenopathy, moderate bilateral pleural effusions, extensive interstitial thickening and reticulonodular opacities throughout the lungs, cannot exclude lymphangitic spread of tumor, diffuse skeletal metastases.  The patient's subsequent history is as detailed below.   INTERVAL HISTORY: Suzanne [name] returns today for follow-up and treatment of her Metastatic cancer.  She was started on anastrozole during her recent hospitalization  Since her last visit here, she underwent an ultrasound guided left thoracentesis on 10/29/2018 yielding 350 mL of pleural fluid.     She also underwent a chest xray on 10/29/2018 showing Extensive chronic lung changes. No pneumothorax following LEFT thoracentesis.  Cytology confirms metastatic adenocarcinoma, estrogen receptor positive, with final prognostic panel pending.   REVIEW OF SYSTEMS: Suzanne [name]    PAST MEDICAL HISTORY: Past Medical History:  Diagnosis Date   Gallstones    Mastitis    reports history of recurrent mastitis   Obesity      PAST SURGICAL HISTORY: Past Surgical History:  Procedure Laterality Date   IR THORACENTESIS ASP PLEURAL SPACE W/IMG GUIDE  10/28/2018   IR THORACENTESIS ASP PLEURAL SPACE W/IMG GUIDE  10/29/2018     FAMILY HISTORY: Family History  Problem Relation Age of Onset   Breast cancer Sister        in her 39's-70s   Heart disease Brother        CABG   Heart disease Brother        CABG   Heart disease Sister        CAD with stent   Skin cancer Brother    Colon cancer Cousin  GYNECOLOGIC HISTORY:     SOCIAL HISTORY:     ADVANCED DIRECTIVES:    HEALTH MAINTENANCE: Social History   Tobacco Use   Smoking status: Never Smoker   Smokeless tobacco: Never Used  Substance Use Topics   Alcohol use: No   Drug use: No    Colonoscopy:   PAP:   Bone  density:  Mammography:   No Known Allergies  Current Outpatient Medications  Medication Sig Dispense Refill   anastrozole (ARIMIDEX) 1 MG tablet Take 1 tablet (1 mg total) by mouth daily. 30 tablet 0   clotrimazole (LOTRIMIN) 1 % cream Apply topically 2 (two) times daily. For 2-4 weeks. 30 g 0   oxyCODONE (OXY IR/ROXICODONE) 5 MG immediate release tablet Take 0.5 tablets (2.5 mg total) by mouth every 4 (four) hours as needed for moderate pain or severe pain. 15 tablet 0   No current facility-administered medications for this visit.      OBJECTIVE:  There were no vitals filed for this visit.   There is no height or weight on file to calculate BMI.   Wt Readings from Last 3 Encounters:  10/30/18 234 lb 2.1 oz (106.2 kg)  02/21/11 235 lb (106.6 kg)      ECOG FS:    LAB RESULTS:  CMP     Component Value Date/Time   NA 135 10/30/2018 0311   K 3.8 10/30/2018 0311   CL 97 (L) 10/30/2018 0311   CO2 27 10/30/2018 0311   GLUCOSE 95 10/30/2018 0311   BUN 10 10/30/2018 0311   CREATININE 0.78 10/30/2018 0311   CALCIUM 10.1 10/30/2018 0311   PROT 6.1 (L) 10/27/2018 0859   ALBUMIN 2.8 (L) 10/27/2018 0859   AST 38 10/27/2018 0859   ALT 24 10/27/2018 0859   ALKPHOS 367 (H) 10/27/2018 0859   BILITOT 1.9 (H) 10/27/2018 0859   GFRNONAA >60 10/30/2018 0311   GFRAA >60 10/30/2018 0311    No results found for: TOTALPROTELP, ALBUMINELP, A1GS, A2GS, BETS, BETA2SER, GAMS, MSPIKE, SPEI  No results found for: KPAFRELGTCHN, LAMBDASER, KAPLAMBRATIO  Lab Results  Component Value Date   WBC 6.0 10/30/2018   NEUTROABS 4.5 10/26/2018   HGB 7.3 (L) 10/30/2018   HCT 22.6 (L) 10/30/2018   MCV 100.9 (H) 10/30/2018   PLT 93 (L) 10/30/2018    @LASTCHEMISTRY @  No results found for: LABCA2  No components found for: XNATFT732  Recent Labs  Lab 10/27/18 0859  INR 1.1    No results found for: LABCA2  No results found for: KGU542  No results found for: HCW237  No results found  for: SEG315  Lab Results  Component Value Date   CA2729 1,810.4 (H) 10/27/2018    No components found for: HGQUANT  No results found for: CEA1 / No results found for: CEA1   No results found for: AFPTUMOR  No results found for: CHROMOGRNA  No results found for: PSA1  No results displayed because visit has over 200 results.      (this displays the last labs from the last 3 days)  No results found for: TOTALPROTELP, ALBUMINELP, A1GS, A2GS, BETS, BETA2SER, GAMS, MSPIKE, SPEI (this displays SPEP labs)  No results found for: KPAFRELGTCHN, LAMBDASER, KAPLAMBRATIO (kappa/lambda light chains)  No results found for: HGBA, HGBA2QUANT, HGBFQUANT, HGBSQUAN (Hemoglobinopathy evaluation)   Lab Results  Component Value Date   LDH 312 (H) 10/27/2018    Lab Results  Component Value Date   IRON 137 10/26/2018   TIBC 384 10/26/2018  IRONPCTSAT 36 (H) 10/26/2018   (Iron and TIBC)  Lab Results  Component Value Date   FERRITIN 797 (H) 10/26/2018    Urinalysis    Component Value Date/Time   COLORURINE YELLOW 10/27/2018 2346   APPEARANCEUR HAZY (A) 10/27/2018 2346   LABSPEC 1.038 (H) 10/27/2018 2346   PHURINE 5.0 10/27/2018 2346   GLUCOSEU NEGATIVE 10/27/2018 2346   HGBUR NEGATIVE 10/27/2018 2346   BILIRUBINUR NEGATIVE 10/27/2018 2346   KETONESUR 5 (A) 10/27/2018 2346   PROTEINUR NEGATIVE 10/27/2018 2346   NITRITE NEGATIVE 10/27/2018 2346   LEUKOCYTESUR NEGATIVE 10/27/2018 2346     STUDIES:  Dg Chest 1 View  Result Date: 10/29/2018 CLINICAL DATA:  Post LEFT thoracentesis EXAM: CHEST  1 VIEW COMPARISON:  622 in 20 FINDINGS: Upper normal heart size. Stable mediastinal contours. BILATERAL upper lobe scarring. Improved aeration at bases since prior study. Minimal residual LEFT pleural effusion without pneumothorax. Bones demineralized. IMPRESSION: Extensive chronic lung changes. No pneumothorax following LEFT thoracentesis. Electronically Signed   By: Lavonia Dana M.D.   On:  10/29/2018 15:50   Dg Chest 1 View  Result Date: 10/28/2018 CLINICAL DATA:  Post right thoracentesis EXAM: CHEST  1 VIEW COMPARISON:  10/28/2018 FINDINGS: Very low lung volumes again noted. Interstitial prominence within the lungs. Heart is borderline in size with vascular congestion. No pneumothorax following right thoracentesis. Diffuse skeletal metastases again noted. IMPRESSION: Very low lung volumes with cardiomegaly, vascular congestion and interstitial prominence. No pneumothorax following thoracentesis. Electronically Signed   By: Rolm Baptise M.D.   On: 10/28/2018 16:00   Ct Head W & Wo Contrast  Result Date: 10/30/2018 CLINICAL DATA:  Metastatic breast cancer. No neurologic symptoms are reported. EXAM: CT HEAD WITHOUT AND WITH CONTRAST TECHNIQUE: Contiguous axial images were obtained from the base of the skull through the vertex without and with intravenous contrast CONTRAST:  46mL ISOVUE-300 IOPAMIDOL (ISOVUE-300) INJECTION 61% COMPARISON:  Nuclear medicine whole-body bone scan, 10/27/2018 FINDINGS: Brain: No acute stroke, hemorrhage, mass lesion or hydrocephalus. Mild atrophy. No extra-axial fluid. Post infusion, no abnormal enhancement of the brain or visible meninges. Vascular: No hyperdense vessel or unexpected calcification. Visible vessels are patent. Skull: Widespread metastatic disease to the calvarium, skull base, and visible cervical spine. Metastatic disease to the mandible is also present. There is destruction of the inner and outer table of the skull in multiple locations. Epidural tumor is not established. Sinuses/Orbits: No acute finding. Other: None. IMPRESSION: 1. Widespread osseous metastatic disease to the calvarium, skull base, and visible cervical spine. Epidural tumor not established. 2. No intracranial metastatic disease is evident. Electronically Signed   By: Staci Righter M.D.   On: 10/30/2018 09:50   Ct Chest W Contrast  Result Date: 10/26/2018 CLINICAL DATA:  Shortness  of breath, possible metastatic disease. Breast cancer. EXAM: CT CHEST WITH CONTRAST TECHNIQUE: Multidetector CT imaging of the chest was performed during intravenous contrast administration. CONTRAST:  8mL OMNIPAQUE IOHEXOL 300 MG/ML  SOLN COMPARISON:  Chest x-ray earlier today FINDINGS: Cardiovascular: Mild cardiomegaly. Moderate coronary artery calcifications in the left anterior descending coronary artery. Aorta is normal caliber. Scattered atherosclerotic change. Mediastinum/Nodes: Numerous borderline sized and mildly enlarged mediastinal lymph nodes. Right paratracheal node measures 12 mm. Borderline size bilateral hilar lymph nodes, the largest on the right measuring 11 mm in short axis diameter. Numerous enlarged bilateral axillary lymph nodes with index left axillary lymph node having a short axis diameter of 2.1 cm on image 21 and right axillary lymph node having a short  axis diameter of 1.4 cm on image 48. Lungs/Pleura: Moderate bilateral pleural effusions. Extensive interstitial disease throughout the lungs bilaterally. Mild reticulonodular densities. Cannot exclude lymphangitic spread of tumor. Upper Abdomen: Gallstones noted within the gallbladder. Musculoskeletal: Chest wall soft tissues are unremarkable. Extensive diffuse mixed lytic and sclerotic lesions throughout the visualized skeleton compatible with diffuse skeletal metastases. IMPRESSION: Bilateral axillary and mediastinal adenopathy. Borderline hilar lymph nodes bilaterally. Findings likely reflect metastatic lymphadenopathy. Moderate bilateral pleural effusions. Extensive interstitial thickening and reticulonodular opacities throughout the lungs. Cannot exclude lymphangitic spread of tumor. Diffuse skeletal metastases. Cholelithiasis. Coronary artery disease. Aortic Atherosclerosis (ICD10-I70.0). Electronically Signed   By: Rolm Baptise M.D.   On: 10/26/2018 22:54   Nm Bone Scan Whole Body  Result Date: 10/27/2018 CLINICAL DATA:  Breast  cancer staging. EXAM: NUCLEAR MEDICINE WHOLE BODY BONE SCAN TECHNIQUE: Whole body anterior and posterior images were obtained approximately 3 hours after intravenous injection of radiopharmaceutical. RADIOPHARMACEUTICALS:  20.6 mCi Technetium-3m MDP IV COMPARISON:  CT scan Oct 26, 2018 FINDINGS: Bony metastatic disease is seen in both of the femurs, numerous ribs, the manubrium, and right humerus. The widespread bony metastatic disease in the scapula, thoracic spine, sternum, and proximal humeri is better appreciated on the comparison CT scan. IMPRESSION: Widespread bony metastatic disease. The extent of metastatic disease is actually better appreciated on the recent CT imaging. This study is a near super scan consistent with widespread bony metastatic disease. Electronically Signed   By: Dorise Bullion III M.D   On: 10/27/2018 16:01   Dg Chest Port 1 View  Result Date: 10/28/2018 CLINICAL DATA:  Bone injury EXAM: PORTABLE CHEST 1 VIEW COMPARISON:  10/26/2018 FINDINGS: Diffuse widespread metastatic disease is again noted involving the osseous structures. There is an acute appearing fracture involving the fourth rib posterolaterally on the left. There are likely additional left-sided rib fractures that are suboptimally evaluated on this exam. The lung volumes are low. The cardiac silhouette is enlarged. There are growing bilateral pleural effusions and prominent bilateral interstitial lung markings. Bibasilar airspace opacities are noted. IMPRESSION: 1. Acute left-sided rib fracture involving the fourth rib posterolaterally. No pneumothorax. There are likely additional adjacent fractures that are suboptimally evaluated on this exam. 2. Low lung volumes. 3. Cardiomegaly with growing bilateral pleural effusions and prominent interstitial lung markings suggestive of progressive pulmonary edema. Electronically Signed   By: Constance Holster M.D.   On: 10/28/2018 01:55   Dg Chest Portable 1 View  Result Date:  10/26/2018 CLINICAL DATA:  Shortness of breath, possible right breast cancer EXAM: PORTABLE CHEST 1 VIEW COMPARISON:  None. FINDINGS: Low lung volumes. No pleural effusion. Diffuse interstitial opacity. Heart size upper limits of normal. No pneumothorax. Diffuse sclerosis and lucent lesions within the clavicles, ribs and shoulders. IMPRESSION: 1. Low lung volumes without consolidation or effusion. Diffuse bilateral interstitial opacity of unknown chronicity. Findings could be secondary to interstitial inflammatory process, atypical/viral pneumonia, or possible metastatic disease given clinical history. CT chest suggested for further evaluation 2. 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   On: 10/26/2018 20:11   Ir Thoracentesis Asp Pleural Space W/img Guide  Result Date: 10/29/2018 INDICATION: Patient with history of metastatic disease on imaging, dyspnea, and bilateral pleural effusions. Request is made for therapeutic left thoracentesis. EXAM: ULTRASOUND GUIDED THERAPEUTIC LEFT THORACENTESIS MEDICATIONS: 10 mL 1% lidocaine COMPLICATIONS: None immediate. PROCEDURE: An ultrasound guided thoracentesis was thoroughly discussed with  the patient and questions answered. The benefits, risks, alternatives and complications were also discussed. The patient understands and wishes to proceed with the procedure. Written consent was obtained. Ultrasound was performed to localize and mark an adequate pocket of fluid in the left chest. The area was then prepped and draped in the normal sterile fashion. 1% Lidocaine was used for local anesthesia. Under ultrasound guidance a 6 Fr Safe-T-Centesis catheter was introduced. Thoracentesis was performed. The catheter was removed and a dressing applied. FINDINGS: A total of approximately 350 mL of hazy gold fluid was removed. IMPRESSION: Successful  ultrasound guided left thoracentesis yielding 350 mL of pleural fluid. Read by: Earley Abide, PA-C Electronically Signed   By: Jerilynn Mages.  Shick M.D.   On: 10/29/2018 15:58   Ir Thoracentesis Asp Pleural Space W/img Guide  Result Date: 10/28/2018 INDICATION: Patient with history of metastatic disease (including bone metastases) on imaging, dyspnea, and bilateral pleural effusions (R>L). Request is made for diagnostic and therapeutic right thoracentesis. EXAM: ULTRASOUND GUIDED DIAGNOSTIC AND THERAPEUTIC RIGHT THORACENTESIS MEDICATIONS: 10 mL 1% lidocaine COMPLICATIONS: None immediate. PROCEDURE: An ultrasound guided thoracentesis was thoroughly discussed with the patient and questions answered. The benefits, risks, alternatives and complications were also discussed. The patient understands and wishes to proceed with the procedure. Written consent was obtained. Ultrasound was performed to localize and mark an adequate pocket of fluid in the right chest. The area was then prepped and draped in the normal sterile fashion. 1% Lidocaine was used for local anesthesia. Under ultrasound guidance a 6 Fr Safe-T-Centesis catheter was introduced. Thoracentesis was performed. The catheter was removed and a dressing applied. FINDINGS: A total of approximately 600 mL of clear gold fluid was removed. Samples were sent to the laboratory as requested by the clinical team. IMPRESSION: Successful ultrasound guided right thoracentesis yielding 600 mL of pleural fluid. Read by: Earley Abide, PA-C Electronically Signed   By: Jerilynn Mages.  Shick M.D.   On: 10/28/2018 16:10     ELIGIBLE FOR AVAILABLE RESEARCH PROTOCOL:    ASSESSMENT: 58 y.o. [place], [place] woman presenting 10/26/2018 with right-sided inflammatory breast cancer, stage IV, as follows:  (a) chest CT scan 10/26/2018 shows bilateral pleural effusions, possible lymphangitic spread of tumor, diffuse bony metastatic disease, and significant axillary mediastinal and hilar  adenopathy  (b) bone scan 10/27/2018 is a "near [place]" consistent with widespread bony metastatic disease  (c) head CT with and without contrast 10/30/2018 shows no intracranial metastatic disease, multiple calvarial lesions  (d) CA-27-29 on 10/27/2018 was 1810.4  (1) pleural fluid from right thoracentesis 10/28/2018 confirms estrogen receptor positive malignant cells consistent with a breast primary  (2) anastrozole started 10/29/2018     PLAN:    Chauncey Cruel, MD  10/31/18 1:25 PM Medical Oncology [facility] H[facility]ology [facility] [place], [place] [address] Tel. [phone]    Fax. [phone]

## 2018-10-31 ENCOUNTER — Inpatient Hospital Stay: Payer: Medicaid Other | Attending: Oncology | Admitting: Oncology

## 2018-10-31 ENCOUNTER — Encounter: Payer: Self-pay | Admitting: Oncology

## 2018-10-31 ENCOUNTER — Inpatient Hospital Stay: Payer: Medicaid Other

## 2018-10-31 ENCOUNTER — Telehealth: Payer: Self-pay | Admitting: Oncology

## 2018-10-31 DIAGNOSIS — I890 Lymphedema, not elsewhere classified: Secondary | ICD-10-CM | POA: Insufficient documentation

## 2018-10-31 DIAGNOSIS — Z79899 Other long term (current) drug therapy: Secondary | ICD-10-CM | POA: Insufficient documentation

## 2018-10-31 DIAGNOSIS — J910 Malignant pleural effusion: Secondary | ICD-10-CM | POA: Insufficient documentation

## 2018-10-31 DIAGNOSIS — C7801 Secondary malignant neoplasm of right lung: Secondary | ICD-10-CM | POA: Insufficient documentation

## 2018-10-31 DIAGNOSIS — C50811 Malignant neoplasm of overlapping sites of right female breast: Secondary | ICD-10-CM | POA: Insufficient documentation

## 2018-10-31 DIAGNOSIS — Z6841 Body Mass Index (BMI) 40.0 and over, adult: Secondary | ICD-10-CM

## 2018-10-31 DIAGNOSIS — R0902 Hypoxemia: Secondary | ICD-10-CM | POA: Insufficient documentation

## 2018-10-31 DIAGNOSIS — G893 Neoplasm related pain (acute) (chronic): Secondary | ICD-10-CM

## 2018-10-31 DIAGNOSIS — C50919 Malignant neoplasm of unspecified site of unspecified female breast: Secondary | ICD-10-CM | POA: Insufficient documentation

## 2018-10-31 DIAGNOSIS — Z803 Family history of malignant neoplasm of breast: Secondary | ICD-10-CM | POA: Insufficient documentation

## 2018-10-31 DIAGNOSIS — C7800 Secondary malignant neoplasm of unspecified lung: Secondary | ICD-10-CM | POA: Insufficient documentation

## 2018-10-31 DIAGNOSIS — C7951 Secondary malignant neoplasm of bone: Secondary | ICD-10-CM | POA: Insufficient documentation

## 2018-10-31 DIAGNOSIS — Z79811 Long term (current) use of aromatase inhibitors: Secondary | ICD-10-CM | POA: Insufficient documentation

## 2018-10-31 DIAGNOSIS — Z170 Estrogen receptor positive status [ER+]: Secondary | ICD-10-CM | POA: Insufficient documentation

## 2018-10-31 DIAGNOSIS — D649 Anemia, unspecified: Secondary | ICD-10-CM | POA: Insufficient documentation

## 2018-10-31 DIAGNOSIS — E669 Obesity, unspecified: Secondary | ICD-10-CM | POA: Insufficient documentation

## 2018-10-31 DIAGNOSIS — C7802 Secondary malignant neoplasm of left lung: Secondary | ICD-10-CM | POA: Insufficient documentation

## 2018-10-31 NOTE — Telephone Encounter (Signed)
CAlled pt per 6/5 sch message - no answer . Left message for patient to call back and reschedule appt

## 2018-11-02 LAB — CULTURE, BODY FLUID W GRAM STAIN -BOTTLE: Culture: NO GROWTH

## 2018-11-03 ENCOUNTER — Telehealth: Payer: Self-pay | Admitting: Oncology

## 2018-11-03 NOTE — Telephone Encounter (Signed)
Patient dtr called and rescheduled missed 6/5 visit to 6/11.

## 2018-11-05 ENCOUNTER — Telehealth: Payer: Self-pay

## 2018-11-05 ENCOUNTER — Other Ambulatory Visit: Payer: Self-pay

## 2018-11-05 ENCOUNTER — Telehealth: Payer: Self-pay | Admitting: Emergency Medicine

## 2018-11-05 DIAGNOSIS — Z170 Estrogen receptor positive status [ER+]: Secondary | ICD-10-CM

## 2018-11-05 DIAGNOSIS — C50811 Malignant neoplasm of overlapping sites of right female breast: Secondary | ICD-10-CM

## 2018-11-05 NOTE — Telephone Encounter (Signed)
[place] Nurse called with a problem of low oxygen saturation rates. Patient's nurse states levels consistently in the mid to high 80's.  Nurse states patient could use oxygen .  Patient referred to clinic but has yet to be seen.    Nurse advised Hinton Dyer to call oncology as they have seen her several times since 10/31/2018.    Pa[facility] nurse advised that id conditions worsen to call 911.  Nurse acknowledged understanding of advise.

## 2018-11-05 NOTE — Telephone Encounter (Signed)
RN received call from home health nurse, Hinton Dyer, needing verbal orders for oxygen parameters.    Per Dr. Jana Hakim if o2 on RA drops below 90% apply 2L via [place], if respirations are greater than 22 apply 2L via [place].  Dana notified, no further needs.

## 2018-11-06 ENCOUNTER — Other Ambulatory Visit: Payer: Self-pay

## 2018-11-06 ENCOUNTER — Inpatient Hospital Stay: Payer: Medicaid Other

## 2018-11-06 ENCOUNTER — Encounter: Payer: Self-pay | Admitting: Oncology

## 2018-11-06 ENCOUNTER — Inpatient Hospital Stay: Payer: Self-pay

## 2018-11-06 ENCOUNTER — Inpatient Hospital Stay (HOSPITAL_BASED_OUTPATIENT_CLINIC_OR_DEPARTMENT_OTHER): Payer: Medicaid Other | Admitting: Oncology

## 2018-11-06 VITALS — BP 108/55 | HR 109 | Temp 99.6°F | Resp 18 | Ht 63.0 in

## 2018-11-06 DIAGNOSIS — Z6841 Body Mass Index (BMI) 40.0 and over, adult: Secondary | ICD-10-CM

## 2018-11-06 DIAGNOSIS — D649 Anemia, unspecified: Secondary | ICD-10-CM

## 2018-11-06 DIAGNOSIS — I890 Lymphedema, not elsewhere classified: Secondary | ICD-10-CM | POA: Diagnosis not present

## 2018-11-06 DIAGNOSIS — Z170 Estrogen receptor positive status [ER+]: Secondary | ICD-10-CM

## 2018-11-06 DIAGNOSIS — C50919 Malignant neoplasm of unspecified site of unspecified female breast: Secondary | ICD-10-CM

## 2018-11-06 DIAGNOSIS — C50811 Malignant neoplasm of overlapping sites of right female breast: Secondary | ICD-10-CM

## 2018-11-06 DIAGNOSIS — Z7189 Other specified counseling: Secondary | ICD-10-CM

## 2018-11-06 DIAGNOSIS — Z79811 Long term (current) use of aromatase inhibitors: Secondary | ICD-10-CM | POA: Diagnosis not present

## 2018-11-06 DIAGNOSIS — R0602 Shortness of breath: Secondary | ICD-10-CM

## 2018-11-06 DIAGNOSIS — G893 Neoplasm related pain (acute) (chronic): Secondary | ICD-10-CM | POA: Diagnosis not present

## 2018-11-06 DIAGNOSIS — R0902 Hypoxemia: Secondary | ICD-10-CM | POA: Diagnosis not present

## 2018-11-06 DIAGNOSIS — C7801 Secondary malignant neoplasm of right lung: Secondary | ICD-10-CM | POA: Diagnosis not present

## 2018-11-06 DIAGNOSIS — D509 Iron deficiency anemia, unspecified: Secondary | ICD-10-CM

## 2018-11-06 DIAGNOSIS — J910 Malignant pleural effusion: Secondary | ICD-10-CM | POA: Diagnosis not present

## 2018-11-06 DIAGNOSIS — C7951 Secondary malignant neoplasm of bone: Secondary | ICD-10-CM

## 2018-11-06 DIAGNOSIS — J90 Pleural effusion, not elsewhere classified: Secondary | ICD-10-CM

## 2018-11-06 DIAGNOSIS — C7802 Secondary malignant neoplasm of left lung: Secondary | ICD-10-CM

## 2018-11-06 DIAGNOSIS — Z803 Family history of malignant neoplasm of breast: Secondary | ICD-10-CM

## 2018-11-06 DIAGNOSIS — Z79899 Other long term (current) drug therapy: Secondary | ICD-10-CM | POA: Diagnosis not present

## 2018-11-06 LAB — CBC WITH DIFFERENTIAL (CANCER CENTER ONLY)
Abs Immature Granulocytes: 0.73 10*3/uL — ABNORMAL HIGH (ref 0.00–0.07)
Basophils Absolute: 0 10*3/uL (ref 0.0–0.1)
Basophils Relative: 1 %
Eosinophils Absolute: 0.2 10*3/uL (ref 0.0–0.5)
Eosinophils Relative: 2 %
HCT: 20.3 % — ABNORMAL LOW (ref 36.0–46.0)
Hemoglobin: 6.2 g/dL — CL (ref 12.0–15.0)
Immature Granulocytes: 9 %
Lymphocytes Relative: 25 %
Lymphs Abs: 2.1 10*3/uL (ref 0.7–4.0)
MCH: 31.5 pg (ref 26.0–34.0)
MCHC: 30.5 g/dL (ref 30.0–36.0)
MCV: 103 fL — ABNORMAL HIGH (ref 80.0–100.0)
Monocytes Absolute: 0.4 10*3/uL (ref 0.1–1.0)
Monocytes Relative: 5 %
Neutro Abs: 4.8 10*3/uL (ref 1.7–7.7)
Neutrophils Relative %: 58 %
Platelet Count: 112 10*3/uL — ABNORMAL LOW (ref 150–400)
RBC: 1.97 MIL/uL — ABNORMAL LOW (ref 3.87–5.11)
RDW: 19.6 % — ABNORMAL HIGH (ref 11.5–15.5)
WBC Count: 8.3 10*3/uL (ref 4.0–10.5)
nRBC: 2.2 % — ABNORMAL HIGH (ref 0.0–0.2)

## 2018-11-06 LAB — CMP (CANCER CENTER ONLY)
ALT: 15 U/L (ref 0–44)
AST: 24 U/L (ref 15–41)
Albumin: 2 g/dL — ABNORMAL LOW (ref 3.5–5.0)
Alkaline Phosphatase: 284 U/L — ABNORMAL HIGH (ref 38–126)
Anion gap: 12 (ref 5–15)
BUN: 9 mg/dL (ref 6–20)
CO2: 29 mmol/L (ref 22–32)
Calcium: 8.1 mg/dL — ABNORMAL LOW (ref 8.9–10.3)
Chloride: 95 mmol/L — ABNORMAL LOW (ref 98–111)
Creatinine: 0.72 mg/dL (ref 0.44–1.00)
GFR, Est AFR Am: >60 mL/min (ref >60)
GFR, Estimated: >60 mL/min (ref >60)
Glucose, Bld: 126 mg/dL — ABNORMAL HIGH (ref 70–99)
Potassium: 3.3 mmol/L — ABNORMAL LOW (ref 3.5–5.1)
Sodium: 136 mmol/L (ref 135–145)
Total Bilirubin: 0.8 mg/dL (ref 0.3–1.2)
Total Protein: 6.3 g/dL — ABNORMAL LOW (ref 6.5–8.1)

## 2018-11-06 MED ORDER — PALBOCICLIB 125 MG PO CAPS: 125.0000 mg | ORAL_CAPSULE | Freq: Every day | ORAL | 6 refills | Status: DC

## 2018-11-06 MED ORDER — OXYCODONE HCL 5 MG PO TABS: 2.5000 mg | ORAL_TABLET | ORAL | 0 refills | Status: DC | PRN

## 2018-11-06 NOTE — Progress Notes (Signed)
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 Her sister [name] drove her here today.  She was started on anastrozole following her consult on 10/27/2018, during her recent hospitalization. She denies any increase in hot flashes.  Since she was last seen, she underwent echocardiogram on 10/27/2018. This showed an ejection fraction of 60-65%.  She also underwent an ultrasound guided left thoracentesis on 10/28/2018 yielding 600 mL of pleural fluid. Cytology [phone]) confirms metastatic adenocarcinoma. Prognostic indicators are significant for: estrogen receptor 100% positive and progesterone receptor 100% positive, both with strong staining intensity; proliferation marker Ki67 <2%; Her2 negative by immunohistochemistry, 1+.   Repeat thoracentesis on 10/29/2018 yielded 350 mL of fluid. A chest xray on 10/29/2018 showing: extensive chronic lung changes; no pneumothorax following LEFT thoracentesis.    She also underwent head CT on 10/30/2018. This revealed: widespread osseous metastatic disease to the calvarium, skull base, and visible cervical spine; epidural tumor not established; no intercranial metastatic disease is evident.   REVIEW OF SYSTEMS: Jaiden reports her right hand is swollen and painful, and her feet are also swollen and uncomfortable. She states she hurt her clavicle. Both of her lungs hurt. She is using a walker to ambulate. She reports every day is a little better. She is unable to lay flat, so she is sleeping sitting up on her couch. She is being  visited by home health nurse.  According to the patient the home health nurse checked her oxygen and it was low and she is trying to get her oxygen at home.  The patient reports dry mouth but denies any taste changes. She works from home, mostly on a computer, and her daughter is currently acting as her hands. A detailed review of systems was otherwise entirely negative.   HISTORY OF CURRENT ILLNESS: From the original inpatient consult note:  Suzanne [name] is a 58 year old female from [place], [place] without significant past medical history with exception of obesity, remote history of gallstones, and remote history of mastitis.  The patient presented to the hospital on 10/26/2018 with a 3-week history of worsening shortness of breath and lower extremity edema.  The patient reported that her shortness of breath worsened with exertion.  Her lower extremity edema did not improve with elevation.  She also had an intermittent productive cough with rare sputum production.  The patient was noted to be anemic with a hemoglobin of 4.5 with a clumped platelets noted on admission.  Stool for occult blood was positive.  Patient received 2 units of packed red blood cells.    The patient reported a rash under her breast x1 week which has been foul-smelling.  She also reported that she has some discomfort and thickening/hardness of her right breast for several months.  She states that her right breast is much larger than her left.  She notes some occasional bloody discharge from her breast once in a while.  A chest x-ray on admission showed diffuse bilateral interstitial opacity of unknown chronicity, findings could be related to interstitial inflammatory process, atypical/viral pneumonia, or possible metastatic disease.  There are also sclerotic and lytic  lesions within the clavicles, bilateral ribs, shoulders which was concerning for diffuse few skeletal metastatic disease.    This prompted a CT scan of the  chest with contrast which showed bilateral axillary and mediastinal adenopathy, borderline hilar lymph nodes bilaterally, findings likely flecked metastatic lymphadenopathy, moderate bilateral pleural effusions, extensive interstitial thickening and reticulonodular opacities throughout the lungs, cannot exclude lymphangitic spread of tumor, diffuse skeletal metastases.  The patient's subsequent history is as detailed below.   PAST MEDICAL HISTORY: Past Medical History:  Diagnosis Date   Gallstones    Mastitis    reports history of recurrent mastitis   Obesity     PAST SURGICAL HISTORY: Past Surgical History:  Procedure Laterality Date   IR THORACENTESIS ASP PLEURAL SPACE W/IMG GUIDE  10/28/2018   IR THORACENTESIS ASP PLEURAL SPACE W/IMG GUIDE  10/29/2018    FAMILY HISTORY: Family History  Problem Relation Age of Onset   Breast cancer Sister        in her 72's-70s   Heart disease Brother        CABG   Heart disease Brother        CABG   Skin cancer Brother        melanoma   Colon cancer Cousin    Heart attack Mother    Heart attack Father    Prostate cancer Brother   Patient's father passed away at age 10, and her mother at 45, both from heart attacks. The patient has 8 siblings, 6 brothers and 1 sister.  Her sister was diagnosed with breast cancer. One brother has prostate cancer, another brother had a melanoma removed.    GYNECOLOGIC HISTORY:  Menarche: 58 years old Age at first live birth: 58 years old [place] P 1 LMP age 5 Contraceptive: used for 2-3 years with no problems HRT never used  Hysterectomy? 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 She is widowed. She lives at home with her daughter. Daughter Suzanne [name], age 61, is a Psychologist, occupational at the science center.  The patient has no grandchildren. She is not a Ambulance person.   ADVANCED DIRECTIVES: not on file; she has the paperwork already. She intends to name her daughter as her  HCPOA.   HEALTH MAINTENANCE: Social History   Tobacco Use   Smoking status: Never Smoker   Smokeless tobacco: Never Used  Substance Use Topics   Alcohol use: No   Drug use: No    Colonoscopy:   PAP:   Bone density:  Mammography:   No Known Allergies  Current Outpatient Medications  Medication Sig Dispense Refill   anastrozole (ARIMIDEX) 1 MG tablet Take 1 tablet (1 mg total) by mouth daily. 30 tablet 0   clotrimazole (LOTRIMIN) 1 % cream Apply topically 2 (two) times daily. For 2-4 weeks. 30 g 0   oxyCODONE (OXY IR/ROXICODONE) 5 MG immediate release tablet Take 0.5 tablets (2.5 mg total) by mouth every 4 (four) hours as needed for moderate pain or severe pain. 15 tablet 0   No current facility-administered medications for this visit.      OBJECTIVE: Morbidly obese white woman examined in a wheelchair  Vitals:   11/06/18 1547  BP: (!) 108/55  Pulse: (!) 109  Resp: 18  Temp: 99.6 F (37.6 C)  SpO2: 93%     Body mass index is 41.47 kg/m.   Wt Readings from Last 3 Encounters:  10/30/18 234 lb 2.1 oz (106.2 kg)  02/21/11 235 lb (106.6 kg)  ECOG FS:2  Sclerae unicteric, EOMs intact Wearing a mask Status post right clavicular fracture Lungs no rales or rhonchi, decreased excursion bilaterally, right greater than left Heart regular rate and rhythm Abd soft, obese, nontender, positive bowel sounds MSK no focal spinal tenderness, grade 2 right upper extremity lymphedema Neuro: nonfocal, well oriented, appropriate affect Breasts: The right breast is imaged below.   Right breast 11/06/2018    LAB RESULTS:  CMP     Component Value Date/Time   NA 136 11/06/2018 1528   K 3.3 (L) 11/06/2018 1528   CL 95 (L) 11/06/2018 1528   CO2 29 11/06/2018 1528   GLUCOSE 126 (H) 11/06/2018 1528   BUN 9 11/06/2018 1528   CREATININE 0.72 11/06/2018 1528   CALCIUM 8.1 (L) 11/06/2018 1528   PROT 6.3 (L) 11/06/2018 1528   ALBUMIN 2.0 (L) 11/06/2018 1528   AST 24  11/06/2018 1528   ALT 15 11/06/2018 1528   ALKPHOS 284 (H) 11/06/2018 1528   BILITOT 0.8 11/06/2018 1528   GFRNONAA >60 11/06/2018 1528   GFRAA >60 11/06/2018 1528    No results found for: TOTALPROTELP, ALBUMINELP, A1GS, A2GS, BETS, BETA2SER, GAMS, MSPIKE, SPEI  No results found for: KPAFRELGTCHN, LAMBDASER, KAPLAMBRATIO  Lab Results  Component Value Date   WBC 8.3 11/06/2018   NEUTROABS 4.8 11/06/2018   HGB 6.2 (LL) 11/06/2018   HCT 20.3 (L) 11/06/2018   MCV 103.0 (H) 11/06/2018   PLT 112 (L) 11/06/2018    _0 @  No results found for: LABCA2  No components found for: TKPTWS568  No results for input(s): INR in the last 168 hours.  No results found for: LABCA2  No results found for: LEX517  No results found for: GYF749  No results found for: SWH675  Lab Results  Component Value Date   CA2729 1,810.4 (H) 10/27/2018    No components found for: HGQUANT  No results found for: CEA1 / No results found for: CEA1   No results found for: AFPTUMOR  No results found for: [place]  No results found for: PSA1  Appointment on 11/06/2018  Component Date Value Ref Range Status   WBC Count 11/06/2018 8.3  4.0 - 10.5 K/uL Final   RBC 11/06/2018 1.97* 3.87 - 5.11 MIL/uL Final   Hemoglobin 11/06/2018 6.2* 12.0 - 15.0 g/dL Final   This critical result has verified and been called to Katheren Puller, RN by Modena Slater on 06 11 2020 at 1548, and has been read back.    HCT 11/06/2018 20.3* 36.0 - 46.0 % Final   MCV 11/06/2018 103.0* 80.0 - 100.0 fL Final   MCH 11/06/2018 31.5  26.0 - 34.0 pg Final   MCHC 11/06/2018 30.5  30.0 - 36.0 g/dL Final   RDW 11/06/2018 19.6* 11.5 - 15.5 % Final   Platelet Count 11/06/2018 112* 150 - 400 K/uL Final   nRBC 11/06/2018 2.2* 0.0 - 0.2 % Final   Neutrophils Relative % 11/06/2018 58  % Final   Neutro Abs 11/06/2018 4.8  1.7 - 7.7 K/uL Final   Lymphocytes Relative 11/06/2018 25  % Final   Lymphs Abs 11/06/2018 2.1   0.7 - 4.0 K/uL Final   Monocytes Relative 11/06/2018 5  % Final   Monocytes Absolute 11/06/2018 0.4  0.1 - 1.0 K/uL Final   Eosinophils Relative 11/06/2018 2  % Final   Eosinophils Absolute 11/06/2018 0.2  0.0 - 0.5 K/uL Final   Basophils Relative 11/06/2018 1  % Final   Basophils Absolute 11/06/2018 0.0  0.0 - 0.1 K/uL Final   WBC Morphology 11/06/2018 METAMYELOCYTES AND MYELOCYTES PRESENT.   Final   Immature Granulocytes 11/06/2018 9  % Final   Abs Immature Granulocytes 11/06/2018 0.73* 0.00 - 0.07 K/uL Final   Polychromasia 11/06/2018 PRESENT   Final   Performed at [facility], [place] [address], [place], [place] [address]   Sodium 11/06/2018 136  135 - 145 mmol/L Final   Potassium 11/06/2018 3.3* 3.5 - 5.1 mmol/L Final   Chloride 11/06/2018 95* 98 - 111 mmol/L Final   CO2 11/06/2018 29  22 - 32 mmol/L Final   Glucose, Bld 11/06/2018 126* 70 - 99 mg/dL Final   BUN 11/06/2018 9  6 - 20 mg/dL Final   Creatinine 11/06/2018 0.72  0.44 - 1.00 mg/dL Final   Calcium 11/06/2018 8.1* 8.9 - 10.3 mg/dL Final   Total Protein 11/06/2018 6.3* 6.5 - 8.1 g/dL Final   Albumin 11/06/2018 2.0* 3.5 - 5.0 g/dL Final   AST 11/06/2018 24  15 - 41 U/L Final   ALT 11/06/2018 15  0 - 44 U/L Final   Alkaline Phosphatase 11/06/2018 284* 38 - 126 U/L Final   Total Bilirubin 11/06/2018 0.8  0.3 - 1.2 mg/dL Final   GFR, Est Non Af Am 11/06/2018 >60  >60 mL/min Final   GFR, Est AFR Am 11/06/2018 >60  >60 mL/min Final   Anion gap 11/06/2018 12  5 - 15 Final   Performed at [facility], [name] [address] [place], [place] [address]    (this displays the last labs from the last 3 days)  No results found for: TOTALPROTELP, ALBUMINELP, A1GS, A2GS, BETS, BETA2SER, GAMS, MSPIKE, SPEI (this displays SPEP labs)  No results found for: KPAFRELGTCHN, LAMBDASER, KAPLAMBRATIO (kappa/lambda light chains)  No results found for: HGBA, HGBA2QUANT,  HGBFQUANT, HGBSQUAN (Hemoglobinopathy evaluation)   Lab Results  Component Value Date   LDH 312 (H) 10/27/2018    Lab Results  Component Value Date   IRON 137 10/26/2018   TIBC 384 10/26/2018   IRONPCTSAT 36 (H) 10/26/2018   (Iron and TIBC)  Lab Results  Component Value Date   FERRITIN 797 (H) 10/26/2018    Urinalysis    Component Value Date/Time   COLORURINE YELLOW 10/27/2018 2346   APPEARANCEUR HAZY (A) 10/27/2018 2346   LABSPEC 1.038 (H) 10/27/2018 2346   PHURINE 5.0 10/27/2018 [place] 10/27/2018 2346   HGBUR NEGATIVE 10/27/2018 2346   BILIRUBINUR NEGATIVE 10/27/2018 2346   KETONESUR 5 (A) 10/27/2018 2346   PROTEINUR NEGATIVE 10/27/2018 2346   NITRITE NEGATIVE 10/27/2018 2346   LEUKOCYTESUR NEGATIVE 10/27/2018 2346     STUDIES:  Dg Chest 1 View  Result Date: 10/29/2018 CLINICAL DATA:  Post LEFT thoracentesis EXAM: CHEST  1 VIEW COMPARISON:  622 in 20 FINDINGS: Upper normal heart size. Stable mediastinal contours. BILATERAL upper lobe scarring. Improved aeration at bases since prior study. Minimal residual LEFT pleural effusion without pneumothorax. Bones demineralized. IMPRESSION: Extensive chronic lung changes. No pneumothorax following LEFT thoracentesis. Electronically Signed   By: Lavonia Dana M.D.   On: 10/29/2018 15:50   Dg Chest 1 View  Result Date: 10/28/2018 CLINICAL DATA:  Post right thoracentesis EXAM: CHEST  1 VIEW COMPARISON:  10/28/2018 FINDINGS: Very low lung volumes again noted. Interstitial prominence within the lungs. Heart is borderline in size with vascular congestion. No pneumothorax following right thoracentesis. Diffuse skeletal metastases again noted. IMPRESSION: Very low lung volumes with cardiomegaly, vascular congestion  and interstitial prominence. No pneumothorax following thoracentesis. Electronically Signed   By: Rolm Baptise M.D.   On: 10/28/2018 16:00   Ct Head W & Wo Contrast  Result Date: 10/30/2018 CLINICAL DATA:   Metastatic breast cancer. No neurologic symptoms are reported. EXAM: CT HEAD WITHOUT AND WITH CONTRAST TECHNIQUE: Contiguous axial images were obtained from the base of the skull through the vertex without and with intravenous contrast CONTRAST:  40m ISOVUE-300 IOPAMIDOL (ISOVUE-300) INJECTION 61% COMPARISON:  Nuclear medicine whole-body bone scan, 10/27/2018 FINDINGS: Brain: No acute stroke, hemorrhage, mass lesion or hydrocephalus. Mild atrophy. No extra-axial fluid. Post infusion, no abnormal enhancement of the brain or visible meninges. Vascular: No hyperdense vessel or unexpected calcification. Visible vessels are patent. Skull: Widespread metastatic disease to the calvarium, skull base, and visible cervical spine. Metastatic disease to the mandible is also present. There is destruction of the inner and outer table of the skull in multiple locations. Epidural tumor is not established. Sinuses/Orbits: No acute finding. Other: None. IMPRESSION: 1. Widespread osseous metastatic disease to the calvarium, skull base, and visible cervical spine. Epidural tumor not established. 2. No intracranial metastatic disease is evident. Electronically Signed   By: JStaci RighterM.D.   On: 10/30/2018 09:50   Ct Chest W Contrast  Result Date: 10/26/2018 CLINICAL DATA:  Shortness of breath, possible metastatic disease. Breast cancer. EXAM: CT CHEST WITH CONTRAST TECHNIQUE: Multidetector CT imaging of the chest was performed during intravenous contrast administration. CONTRAST:  742mOMNIPAQUE IOHEXOL 300 MG/ML  SOLN COMPARISON:  Chest x-ray earlier today FINDINGS: Cardiovascular: Mild cardiomegaly. Moderate coronary artery calcifications in the left anterior descending coronary artery. Aorta is normal caliber. Scattered atherosclerotic change. Mediastinum/Nodes: Numerous borderline sized and mildly enlarged mediastinal lymph nodes. Right paratracheal node measures 12 mm. Borderline size bilateral hilar lymph nodes, the  largest on the right measuring 11 mm in short axis diameter. Numerous enlarged bilateral axillary lymph nodes with index left axillary lymph node having a short axis diameter of 2.1 cm on image 21 and right axillary lymph node having a short axis diameter of 1.4 cm on image 48. Lungs/Pleura: Moderate bilateral pleural effusions. Extensive interstitial disease throughout the lungs bilaterally. Mild reticulonodular densities. Cannot exclude lymphangitic spread of tumor. Upper Abdomen: Gallstones noted within the gallbladder. Musculoskeletal: Chest wall soft tissues are unremarkable. Extensive diffuse mixed lytic and sclerotic lesions throughout the visualized skeleton compatible with diffuse skeletal metastases. IMPRESSION: Bilateral axillary and mediastinal adenopathy. Borderline hilar lymph nodes bilaterally. Findings likely reflect metastatic lymphadenopathy. Moderate bilateral pleural effusions. Extensive interstitial thickening and reticulonodular opacities throughout the lungs. Cannot exclude lymphangitic spread of tumor. Diffuse skeletal metastases. Cholelithiasis. Coronary artery disease. Aortic Atherosclerosis (ICD10-I70.0). Electronically Signed   By: KeRolm Baptise.D.   On: 10/26/2018 22:54   Nm Bone Scan Whole Body  Result Date: 10/27/2018 CLINICAL DATA:  Breast cancer staging. EXAM: NUCLEAR MEDICINE WHOLE BODY BONE SCAN TECHNIQUE: Whole body anterior and posterior images were obtained approximately 3 hours after intravenous injection of radiopharmaceutical. RADIOPHARMACEUTICALS:  20.6 mCi Technetium-9938mP IV COMPARISON:  CT scan Oct 26, 2018 FINDINGS: Bony metastatic disease is seen in both of the femurs, numerous ribs, the manubrium, and right humerus. The widespread bony metastatic disease in the scapula, thoracic spine, sternum, and proximal humeri is better appreciated on the comparison CT scan. IMPRESSION: Widespread bony metastatic disease. The extent of metastatic disease is actually better  appreciated on the recent CT imaging. This study is a near super scan consistent with widespread bony metastatic disease.  Electronically Signed   By: Dorise Bullion III M.D   On: 10/27/2018 16:01   Dg Chest Port 1 View  Result Date: 10/28/2018 CLINICAL DATA:  Bone injury EXAM: PORTABLE CHEST 1 VIEW COMPARISON:  10/26/2018 FINDINGS: Diffuse widespread metastatic disease is again noted involving the osseous structures. There is an acute appearing fracture involving the fourth rib posterolaterally on the left. There are likely additional left-sided rib fractures that are suboptimally evaluated on this exam. The lung volumes are low. The cardiac silhouette is enlarged. There are growing bilateral pleural effusions and prominent bilateral interstitial lung markings. Bibasilar airspace opacities are noted. IMPRESSION: 1. Acute left-sided rib fracture involving the fourth rib posterolaterally. No pneumothorax. There are likely additional adjacent fractures that are suboptimally evaluated on this exam. 2. Low lung volumes. 3. Cardiomegaly with growing bilateral pleural effusions and prominent interstitial lung markings suggestive of progressive pulmonary edema. Electronically Signed   By: Constance Holster M.D.   On: 10/28/2018 01:55   Dg Chest Portable 1 View  Result Date: 10/26/2018 CLINICAL DATA:  Shortness of breath, possible right breast cancer EXAM: PORTABLE CHEST 1 VIEW COMPARISON:  None. FINDINGS: Low lung volumes. No pleural effusion. Diffuse interstitial opacity. Heart size upper limits of normal. No pneumothorax. Diffuse sclerosis and lucent lesions within the clavicles, ribs and shoulders. IMPRESSION: 1. Low lung volumes without consolidation or effusion. Diffuse bilateral interstitial opacity of unknown chronicity. Findings could be secondary to interstitial inflammatory process, atypical/viral pneumonia, or possible metastatic disease given clinical history. CT chest suggested for further evaluation  2. 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   On: 10/26/2018 20:11   Ir Thoracentesis Asp Pleural Space W/img Guide  Result Date: 10/29/2018 INDICATION: Patient with history of metastatic disease on imaging, dyspnea, and bilateral pleural effusions. Request is made for therapeutic left thoracentesis. EXAM: ULTRASOUND GUIDED THERAPEUTIC LEFT THORACENTESIS MEDICATIONS: 10 mL 1% lidocaine COMPLICATIONS: None immediate. PROCEDURE: An ultrasound guided thoracentesis was thoroughly discussed with the patient and questions answered. The benefits, risks, alternatives and complications were also discussed. The patient understands and wishes to proceed with the procedure. Written consent was obtained. Ultrasound was performed to localize and mark an adequate pocket of fluid in the left chest. The area was then prepped and draped in the normal sterile fashion. 1% Lidocaine was used for local anesthesia. Under ultrasound guidance a 6 Fr Safe-T-Centesis catheter was introduced. Thoracentesis was performed. The catheter was removed and a dressing applied. FINDINGS: A total of approximately 350 mL of hazy gold fluid was removed. IMPRESSION: Successful ultrasound guided left thoracentesis yielding 350 mL of pleural fluid. Read by: Earley Abide, PA-C Electronically Signed   By: Jerilynn Mages.  Shick M.D.   On: 10/29/2018 15:58   Ir Thoracentesis Asp Pleural Space W/img Guide  Result Date: 10/28/2018 INDICATION: Patient with history of metastatic disease (including bone metastases) on imaging, dyspnea, and bilateral pleural effusions (R>L). Request is made for diagnostic and therapeutic right thoracentesis. EXAM: ULTRASOUND GUIDED DIAGNOSTIC AND THERAPEUTIC RIGHT THORACENTESIS MEDICATIONS: 10 mL 1% lidocaine COMPLICATIONS: None immediate. PROCEDURE: An ultrasound guided thoracentesis was thoroughly  discussed with the patient and questions answered. The benefits, risks, alternatives and complications were also discussed. The patient understands and wishes to proceed with the procedure. Written consent was obtained. Ultrasound was performed to localize and mark an adequate pocket of fluid in the right chest. The area was then prepped and draped in  the normal sterile fashion. 1% Lidocaine was used for local anesthesia. Under ultrasound guidance a 6 Fr Safe-T-Centesis catheter was introduced. Thoracentesis was performed. The catheter was removed and a dressing applied. FINDINGS: A total of approximately 600 mL of clear gold fluid was removed. Samples were sent to the laboratory as requested by the clinical team. IMPRESSION: Successful ultrasound guided right thoracentesis yielding 600 mL of pleural fluid. Read by: Earley Abide, PA-C Electronically Signed   By: Jerilynn Mages.  Shick M.D.   On: 10/28/2018 16:10     ELIGIBLE FOR AVAILABLE RESEARCH PROTOCOL: no   ASSESSMENT: 58 y.o. [place], [place] woman presenting 10/26/2018 with right-sided inflammatory breast cancer, stage IV, involvnh lungs, lymph nodes and bones, as follows:  (a) chest CT scan 10/26/2018 shows bilateral pleural effusions, possible lymphangitic spread of tumor, diffuse bony metastatic disease, and significant axillary mediastinal and hilar adenopathy  (b) bone scan 10/27/2018 is a "near [place]" consistent with widespread bony metastatic disease  (c) head CT with and without contrast 10/30/2018 shows no intracranial metastatic disease, multiple calvarial lesions  (d) CA-27-29 on 10/27/2018 was 1810.4  (1) pleural fluid from right thoracentesis 10/28/2018 confirms malignant cells consistent with a breast primary, strongly estrogen and progesterone receptor positive, HER-2 not amplified, with an MIB-1 of 2%  (2) anastrozole started 10/29/2018  (a) palbociclib to start 11/13/2018  (b) denosumab/xgeva to start 11/13/2018  (3)  associated problems:  (a) hypoxia secondary to effusions  (b) pain from bone lesions  (c) right upper extremity lymphedema  (d) anemia, requiring transfusion  (e) poor venous access  (4) genetics testing pending     PLAN: I spent approximately 60 minutes today with the L[place] with her daughter on speaker phone going over her situation.   She understands that stage IV breast cancer is not curable with our current knowledge base. The goal of treatment is control. The strategy of treatment is to do only the minimum necessary to control the growth of the tumor so that the patient can have as normal a life as possible. There is no survival advantage in treating aggressively if treating less aggressively results in tumor control. With this strategy stage IV breast cancer in many cases can function as a "chronic illness": something that cannot be quite gotten rid of but can be controlled for an indefinite period of time  I reviewed the cytology from her thoracentesis which shows strongly estrogen receptor positive, HER-2 negative breast cancer with a low proliferation fraction.  This is a slow-growing tumor and it is unlikely to benefit greatly from chemotherapy.  On the other hand it should respond to antiestrogens.  We discussed anastrozole which she is already on and she has a good understanding of the possible toxicities side effects and complications of this agent.  We are going to add palbociclib beginning 11/13/2018.  We discussed the possible toxicities side effects and complications of that agent as well.  Finally she is also going to be receiving denosumab/Xgeva beginning next week and I have asked her to take some Tums on the day of treatment to avoid hypocalcemia.  She understands other possible complications of this treatment including the possibility of osteonecrosis of the jaw.  The plan then is to start her treatments 11/13/2018.  She will receive 1 unit of blood tomorrow.  If we get a  low oxygen reading from her home health nurse we will start a home oxygen as well.  She may need repeat paracentesis initially until her treatment starts working and  hopefully the pleural effusion "dries up".  [name] will receive 1 unit of packed red cells tomorrow for her severe anemia today.  She intends to name her daughter is her healthcare power of attorney.  She may benefit from completing a living will as well.  We will continue to discuss this at the next visit.       Merriel Zinger, Virgie Dad, MD  11/06/18 4:58 PM Medical Oncology and Hematology [facility] [address] [place], [place] [address] Tel. [phone]    Fax. [phone]    I, Wilburn Mylar, am acting as scribe for Dr. Virgie Dad. Lola Lofaro.  I, Lurline Del MD, have reviewed the above documentation for accuracy and completeness, and I agree with the above.

## 2018-11-07 ENCOUNTER — Inpatient Hospital Stay (HOSPITAL_BASED_OUTPATIENT_CLINIC_OR_DEPARTMENT_OTHER): Payer: Medicaid Other | Admitting: Medical

## 2018-11-07 ENCOUNTER — Inpatient Hospital Stay: Payer: Medicaid Other

## 2018-11-07 ENCOUNTER — Telehealth: Payer: Self-pay | Admitting: Pharmacist

## 2018-11-07 ENCOUNTER — Other Ambulatory Visit: Payer: Self-pay | Admitting: Medical

## 2018-11-07 ENCOUNTER — Other Ambulatory Visit: Payer: Self-pay

## 2018-11-07 DIAGNOSIS — C7951 Secondary malignant neoplasm of bone: Secondary | ICD-10-CM

## 2018-11-07 DIAGNOSIS — D649 Anemia, unspecified: Secondary | ICD-10-CM

## 2018-11-07 DIAGNOSIS — R0602 Shortness of breath: Secondary | ICD-10-CM

## 2018-11-07 DIAGNOSIS — C50811 Malignant neoplasm of overlapping sites of right female breast: Secondary | ICD-10-CM

## 2018-11-07 DIAGNOSIS — C7801 Secondary malignant neoplasm of right lung: Secondary | ICD-10-CM

## 2018-11-07 DIAGNOSIS — Z170 Estrogen receptor positive status [ER+]: Secondary | ICD-10-CM

## 2018-11-07 DIAGNOSIS — C50919 Malignant neoplasm of unspecified site of unspecified female breast: Secondary | ICD-10-CM | POA: Diagnosis not present

## 2018-11-07 DIAGNOSIS — J90 Pleural effusion, not elsewhere classified: Secondary | ICD-10-CM

## 2018-11-07 LAB — COMPREHENSIVE METABOLIC PANEL
ALT: 13 U/L (ref 0–44)
AST: 23 U/L (ref 15–41)
Albumin: 2.1 g/dL — ABNORMAL LOW (ref 3.5–5.0)
Alkaline Phosphatase: 305 U/L — ABNORMAL HIGH (ref 38–126)
Anion gap: 13 (ref 5–15)
BUN: 9 mg/dL (ref 6–20)
CO2: 28 mmol/L (ref 22–32)
Calcium: 8.1 mg/dL — ABNORMAL LOW (ref 8.9–10.3)
Chloride: 94 mmol/L — ABNORMAL LOW (ref 98–111)
Creatinine, Ser: 0.73 mg/dL (ref 0.44–1.00)
GFR calc Af Amer: >60 mL/min (ref >60)
GFR calc non Af Amer: >60 mL/min (ref >60)
Glucose, Bld: 119 mg/dL — ABNORMAL HIGH (ref 70–99)
Potassium: 3.1 mmol/L — ABNORMAL LOW (ref 3.5–5.1)
Sodium: 135 mmol/L (ref 135–145)
Total Bilirubin: 0.9 mg/dL (ref 0.3–1.2)
Total Protein: 6.6 g/dL (ref 6.5–8.1)

## 2018-11-07 LAB — CBC WITH DIFFERENTIAL/PLATELET
Abs Immature Granulocytes: 0.82 10*3/uL — ABNORMAL HIGH (ref 0.00–0.07)
Basophils Absolute: 0 10*3/uL (ref 0.0–0.1)
Basophils Relative: 0 %
Eosinophils Absolute: 0.1 10*3/uL (ref 0.0–0.5)
Eosinophils Relative: 2 %
HCT: 21.3 % — ABNORMAL LOW (ref 36.0–46.0)
Hemoglobin: 6.6 g/dL — CL (ref 12.0–15.0)
Immature Granulocytes: 9 %
Lymphocytes Relative: 25 %
Lymphs Abs: 2.4 10*3/uL (ref 0.7–4.0)
MCH: 32.2 pg (ref 26.0–34.0)
MCHC: 31 g/dL (ref 30.0–36.0)
MCV: 103.9 fL — ABNORMAL HIGH (ref 80.0–100.0)
Monocytes Absolute: 0.4 10*3/uL (ref 0.1–1.0)
Monocytes Relative: 4 %
Neutro Abs: 5.9 10*3/uL (ref 1.7–7.7)
Neutrophils Relative %: 60 %
Platelets: 123 10*3/uL — ABNORMAL LOW (ref 150–400)
RBC: 2.05 MIL/uL — ABNORMAL LOW (ref 3.87–5.11)
RDW: 19.7 % — ABNORMAL HIGH (ref 11.5–15.5)
WBC: 9.6 10*3/uL (ref 4.0–10.5)
nRBC: 2.5 % — ABNORMAL HIGH (ref 0.0–0.2)

## 2018-11-07 LAB — SAMPLE TO BLOOD BANK

## 2018-11-07 LAB — PREPARE RBC (CROSSMATCH)

## 2018-11-07 LAB — ABO/RH: ABO/RH(D): O POS

## 2018-11-07 MED ORDER — ACETAMINOPHEN 325 MG PO TABS
ORAL_TABLET | ORAL | Status: AC
  Filled 2018-11-07: qty 2

## 2018-11-07 MED ORDER — DIPHENHYDRAMINE HCL 25 MG PO CAPS
ORAL_CAPSULE | ORAL | Status: AC
  Filled 2018-11-07: qty 1

## 2018-11-07 MED ORDER — SODIUM CHLORIDE 0.9% IV SOLUTION
250.0000 mL | Freq: Once | INTRAVENOUS | Status: AC
  Administered 2018-11-07: 250 mL via INTRAVENOUS
  Filled 2018-11-07: qty 250

## 2018-11-07 MED ORDER — ACETAMINOPHEN 325 MG PO TABS
650.0000 mg | ORAL_TABLET | Freq: Once | ORAL | Status: AC
  Administered 2018-11-07: 650 mg via ORAL

## 2018-11-07 MED ORDER — DIPHENHYDRAMINE HCL 25 MG PO CAPS
25.0000 mg | ORAL_CAPSULE | Freq: Once | ORAL | Status: AC
  Administered 2018-11-07: 25 mg via ORAL

## 2018-11-07 NOTE — Telephone Encounter (Signed)
Oral Chemotherapy Pharmacist Encounter   Attempted to reach patient to provide update and offer for initial counseling on oral medication: Ibrance.  No answer.  Left voicemail on mobile number for patient to call back to discuss details of medication acquisition and initial counseling session.  There was no answer on provided home number and call could not be completed to daughter's listed number.  We were unable to locate prescription medication coverage in Epic or at the pharmacy.  If patient is uninsured, we can Engineer, building services for 1st fill from the pharmacy and assist patient with manufacturer assistance application for [employer]. Income documentation will be required for completed application.  This will be discussed with patient.  Johny Drilling, PharmD, BCPS, BCOP  11/07/2018   9:58 AM Oral Oncology Clinic [phone]

## 2018-11-07 NOTE — Progress Notes (Signed)
Suzanne [name] is a 58 year old female with a diagnosis of a metastatic ER positive breast cancer who is managed by Dr. Jana Hakim and is currently treated with anastrozole.  She additionally has a history of profound anemia and is recently been hospitalized.  During her hospitalization she was noted to have a hemoglobin of 4.4.  She was seen by Dr. Jana Hakim yesterday and was found to have a hemoglobin of 6.6.  She was seen in the infusion room today as she was receiving 1 unit of packed red blood cells.  She appeared to have labored breathing but stated that she felt that she was better than yesterday.  Her labs showed a WBC of 9.6, hemoglobin 6.6, hematocrit 21.3, and platelet count of 123.  Her oxygen saturations were at 90% on room air.  The case was discussed with Dr. Jana Hakim who agreed that the patient could have 1 additional unit of packed red blood cells.  This was ordered for tomorrow.  Sandi Mealy, MHS, PA-C Physician Assistant

## 2018-11-07 NOTE — Telephone Encounter (Signed)
Oral Oncology Pharmacist Encounter  Received new prescription for Ibrance (palbociclib) for the treatment of newly diagnosed, widely metastatic, hormone receptor positive, Her-2 receptor negative breast cancer in conjunction with anastrozole, planned duration until disease progression or unacceptable toxicity.  Labs from 11/06/2018 assessed, OK for treatment initiation. Noted baseline mild thrombocytopenia, will be monitored on treatment  Current medication list in Epic reviewed, no DDIs with Ibrance identified.  Prescription has been e-scribed to the [facility] for benefits analysis and approval.  Oral Oncology Clinic will continue to follow for insurance authorization, copayment issues, initial counseling and start date.  Johny Drilling, PharmD, BCPS, BCOP  11/07/2018 8:09 AM Oral Oncology Clinic [phone]

## 2018-11-07 NOTE — Patient Instructions (Signed)
Blood Transfusion, Adult, Care After This sheet gives you information about how to care for yourself after your procedure. Your doctor may also give you more specific instructions. If you have problems or questions, contact your doctor. Follow these instructions at home:   Take over-the-counter and prescription medicines only as told by your doctor.  Go back to your normal activities as told by your doctor.  Follow instructions from your doctor about how to take care of the area where an IV tube was put into your vein (insertion site). Make sure you: ? Wash your hands with soap and water before you change your bandage (dressing). If there is no soap and water, use hand sanitizer. ? Change your bandage as told by your doctor.  Check your IV insertion site every day for signs of infection. Check for: ? More redness, swelling, or pain. ? More fluid or blood. ? Warmth. ? Pus or a bad smell. Contact a doctor if:  You have more redness, swelling, or pain around the IV insertion site.  You have more fluid or blood coming from the IV insertion site.  Your IV insertion site feels warm to the touch.  You have pus or a bad smell coming from the IV insertion site.  Your pee (urine) turns pink, red, or brown.  You feel weak after doing your normal activities. Get help right away if:  You have signs of a serious allergic or body defense (immune) system reaction, including: ? Itchiness. ? Hives. ? Trouble breathing. ? Anxiety. ? Pain in your chest or lower back. ? Fever, flushing, and chills. ? Fast pulse. ? Rash. ? Watery poop (diarrhea). ? Throwing up (vomiting). ? Dark pee. ? Serious headache. ? Dizziness. ? Stiff neck. ? Yellow color in your face or the white parts of your eyes (jaundice). Summary  After a blood transfusion, return to your normal activities as told by your doctor.  Every day, check for signs of infection where the IV tube was put into your vein.  Some  signs of infection are warm skin, more redness and pain, more fluid or blood, and pus or a bad smell where the needle went in.  Contact your doctor if you feel weak or have any unusual symptoms. This information is not intended to replace advice given to you by your health care provider. Make sure you discuss any questions you have with your health care provider. Document Released: 06/04/2014 Document Revised: 01/06/2016 Document Reviewed: 01/06/2016 Elsevier Interactive Patient Education  2019 Elsevier Inc.  

## 2018-11-08 ENCOUNTER — Other Ambulatory Visit: Payer: Self-pay

## 2018-11-08 DIAGNOSIS — D649 Anemia, unspecified: Secondary | ICD-10-CM

## 2018-11-08 LAB — PREPARE RBC (CROSSMATCH)

## 2018-11-08 LAB — CANCER ANTIGEN 27.29: CA 27.29: 978.7 U/mL — ABNORMAL HIGH (ref 0.0–38.6)

## 2018-11-10 ENCOUNTER — Telehealth: Payer: Self-pay | Admitting: Adult Health

## 2018-11-10 ENCOUNTER — Other Ambulatory Visit: Payer: Self-pay | Admitting: Licensed Clinical Social Worker

## 2018-11-10 ENCOUNTER — Telehealth: Payer: Self-pay | Admitting: *Deleted

## 2018-11-10 DIAGNOSIS — C50811 Malignant neoplasm of overlapping sites of right female breast: Secondary | ICD-10-CM

## 2018-11-10 MED ORDER — FUROSEMIDE 20 MG PO TABS: 20.0000 mg | ORAL_TABLET | Freq: Every day | ORAL | 1 refills | Status: DC

## 2018-11-10 NOTE — Telephone Encounter (Signed)
This RN called pt per follow up from Saturday per pt unable to receive second unit of blood.  Pt was presently occupied with OT in home and requested nurse to speak with her daughterAnderson Malta.  [name] states noted improvement over the weekend with infusion of only 1 unit of blood - except for " rough night last night ".  [name] states pt had difficulty with breathing during the night and couldn't rest well.   This AM she is improved OT is assisting with obtaining a hospital bed.  Per review of above with MD-including if pt needs to come in for 2nd unit of blood today.  Recommendation given to institute lasix daily- pt is scheduled for visit this Thursday.  Above dicussed with [name] who verbalized understanding including contacting this office if symptoms do not improve.

## 2018-11-10 NOTE — Telephone Encounter (Signed)
I tried to contact Pt to set them up for the transportation program and they did not pick up. I did leave a detailed VM with a call back number

## 2018-11-11 ENCOUNTER — Inpatient Hospital Stay: Payer: Self-pay | Admitting: Primary Care

## 2018-11-11 LAB — TYPE AND SCREEN
ABO/RH(D): O POS
Antibody Screen: NEGATIVE
Unit division: 0
Unit division: 0

## 2018-11-11 LAB — BPAM RBC
Blood Product Expiration Date: 202007152359
Blood Product Expiration Date: 202007152359
ISSUE DATE / TIME: 202006121434
Unit Type and Rh: 5100
Unit Type and Rh: 5100

## 2018-11-11 NOTE — Telephone Encounter (Signed)
Oral Chemotherapy Pharmacist Encounter  I spoke with patient for overview of: Ibrance for the treatment of metastatic, hormone-receptor positive, Her-2 receptor negative breast cancer, in combination with anastrazole, planned duration until disease progression or unacceptable toxicity.   Counseled patient on administration, dosing, side effects, monitoring, drug-food interactions, safe handling, storage, and disposal.  Patient will take Ibrance 125 mg capsules, 1 capsule by mouth once daily with food for 3 weeks on, 1 week off.  Patient informed that [name] is changing to tablet formulation and will be dispensed in a box of 3 cards as opposed to current dispensing of a bottle with 21 capsules. Current supply of capsules at pharmacies and wholesalers will be exhausted prior to patients receiving tablet formulation, which will occur over May and June of 2020.  Patient knows to avoid grapefruit and grapefruit juice.  Ibrance start date: 11/13/2018  Adverse effects include but are not limited to: fatigue, hair loss, GI upset, nausea, decreased blood counts, and increased upper respiratory infections. Severe, life-threatening, and/or fatal interstitial lung disease (ILD) and/or pneumonitis may occur with CDK 4/6 inhibitors.  Patient will obtain anti diarrheal and alert the office of 4 or more loose stools above baseline.  Patient reminded of WBC check on Cycle 1 Day 14 for dose and ANC assessment.  Reviewed with patient importance of keeping a medication schedule and plan for any missed doses.  Medication reconciliation performed and medication/allergy list updated.  Patient does not currently have any prescription drug coverage. First fill of the medication will be obtained with a voucher and subsequent fills will be provided via manufacturer assistance, pending application completion at this time. A team member from [facility] to meet with patient at her upcoming appointment  on 11/13/2018 for patient to sign forms and provide income information.   All questions answered.  [name] voiced understanding and appreciation.   Patient knows to call the office with questions or concerns.  Jalene Mullet, PharmD PGY2 Hematology/ Oncology Pharmacy Resident Oral [facility] [phone] 11/11/2018 4:15 PM

## 2018-11-11 NOTE — Telephone Encounter (Signed)
Oral Oncology Patient Advocate Encounter:  Called patient and scheduled 1st shipment of Ibrance from [place] on 11/12/18 to deliver to patient's home on 11/13/18. Patient was then transferred to [facility] for counseling.  4:37 PM [name] Chancellor, CPhT

## 2018-11-12 MED FILL — IBRANCE 125 MG CAPSULE: 125 | 21 days supply | Qty: 21 | Fill #0

## 2018-11-12 NOTE — Telephone Encounter (Signed)
Oral Oncology Patient Advocate Encounter:  Spoke to patient's daughter, [name], and confirmed mailing of [name] today to deliver to patient's home tomorrow. Daughter wanted to discuss patient's OTC medications that she is currently taking. Provided phone number to [facility], for her to follow up. Reminded daughter that patient will need to bring income documents to appointment tomorrow for Manufacturer assistance application.  10:19 AM [name] Chancellor, CPhT

## 2018-11-13 ENCOUNTER — Inpatient Hospital Stay: Payer: Medicaid Other

## 2018-11-13 ENCOUNTER — Other Ambulatory Visit: Payer: Self-pay | Admitting: Adult Health

## 2018-11-13 ENCOUNTER — Ambulatory Visit (HOSPITAL_COMMUNITY)
Admission: RE | Admit: 2018-11-13 | Discharge: 2018-11-13 | Disposition: A | Discharge status: Home / Self Care | Payer: Medicaid Other | Source: Ambulatory Visit | Attending: Adult Health | Admitting: Adult Health

## 2018-11-13 ENCOUNTER — Encounter: Payer: Self-pay | Admitting: General Practice

## 2018-11-13 ENCOUNTER — Encounter: Payer: Self-pay | Admitting: Adult Health

## 2018-11-13 ENCOUNTER — Inpatient Hospital Stay (HOSPITAL_BASED_OUTPATIENT_CLINIC_OR_DEPARTMENT_OTHER): Payer: Medicaid Other | Admitting: Adult Health

## 2018-11-13 ENCOUNTER — Other Ambulatory Visit: Payer: Self-pay

## 2018-11-13 ENCOUNTER — Telehealth: Payer: Self-pay | Admitting: Pharmacy Technician

## 2018-11-13 ENCOUNTER — Telehealth: Payer: Self-pay | Admitting: Oncology

## 2018-11-13 ENCOUNTER — Telehealth: Payer: Self-pay | Admitting: *Deleted

## 2018-11-13 VITALS — BP 122/84 | HR 98 | Temp 98.0°F | Resp 20

## 2018-11-13 DIAGNOSIS — Z170 Estrogen receptor positive status [ER+]: Secondary | ICD-10-CM

## 2018-11-13 DIAGNOSIS — C7901 Secondary malignant neoplasm of right kidney and renal pelvis: Secondary | ICD-10-CM

## 2018-11-13 DIAGNOSIS — C7801 Secondary malignant neoplasm of right lung: Secondary | ICD-10-CM

## 2018-11-13 DIAGNOSIS — Z79811 Long term (current) use of aromatase inhibitors: Secondary | ICD-10-CM

## 2018-11-13 DIAGNOSIS — D649 Anemia, unspecified: Secondary | ICD-10-CM

## 2018-11-13 DIAGNOSIS — J90 Pleural effusion, not elsewhere classified: Secondary | ICD-10-CM

## 2018-11-13 DIAGNOSIS — R0902 Hypoxemia: Secondary | ICD-10-CM | POA: Diagnosis not present

## 2018-11-13 DIAGNOSIS — G893 Neoplasm related pain (acute) (chronic): Secondary | ICD-10-CM | POA: Diagnosis not present

## 2018-11-13 DIAGNOSIS — C50919 Malignant neoplasm of unspecified site of unspecified female breast: Secondary | ICD-10-CM | POA: Diagnosis not present

## 2018-11-13 DIAGNOSIS — C7951 Secondary malignant neoplasm of bone: Secondary | ICD-10-CM

## 2018-11-13 DIAGNOSIS — C50811 Malignant neoplasm of overlapping sites of right female breast: Secondary | ICD-10-CM

## 2018-11-13 DIAGNOSIS — Z79899 Other long term (current) drug therapy: Secondary | ICD-10-CM

## 2018-11-13 DIAGNOSIS — J910 Malignant pleural effusion: Secondary | ICD-10-CM

## 2018-11-13 DIAGNOSIS — K6389 Other specified diseases of intestine: Secondary | ICD-10-CM | POA: Diagnosis not present

## 2018-11-13 DIAGNOSIS — I890 Lymphedema, not elsewhere classified: Secondary | ICD-10-CM

## 2018-11-13 DIAGNOSIS — R0602 Shortness of breath: Secondary | ICD-10-CM

## 2018-11-13 DIAGNOSIS — C801 Malignant (primary) neoplasm, unspecified: Secondary | ICD-10-CM | POA: Diagnosis not present

## 2018-11-13 DIAGNOSIS — C7902 Secondary malignant neoplasm of left kidney and renal pelvis: Secondary | ICD-10-CM | POA: Diagnosis not present

## 2018-11-13 DIAGNOSIS — Z803 Family history of malignant neoplasm of breast: Secondary | ICD-10-CM

## 2018-11-13 LAB — CBC WITH DIFFERENTIAL/PLATELET
Abs Immature Granulocytes: 0.42 10*3/uL — ABNORMAL HIGH (ref 0.00–0.07)
Basophils Absolute: 0 10*3/uL (ref 0.0–0.1)
Basophils Relative: 0 %
Eosinophils Absolute: 0.1 10*3/uL (ref 0.0–0.5)
Eosinophils Relative: 1 %
HCT: 20.8 % — ABNORMAL LOW (ref 36.0–46.0)
Hemoglobin: 6.6 g/dL — CL (ref 12.0–15.0)
Immature Granulocytes: 5 %
Lymphocytes Relative: 25 %
Lymphs Abs: 2.3 10*3/uL (ref 0.7–4.0)
MCH: 31.4 pg (ref 26.0–34.0)
MCHC: 31.7 g/dL (ref 30.0–36.0)
MCV: 99 fL (ref 80.0–100.0)
Monocytes Absolute: 0.4 10*3/uL (ref 0.1–1.0)
Monocytes Relative: 4 %
Neutro Abs: 6 10*3/uL (ref 1.7–7.7)
Neutrophils Relative %: 65 %
Platelets: 145 10*3/uL — ABNORMAL LOW (ref 150–400)
RBC: 2.1 MIL/uL — ABNORMAL LOW (ref 3.87–5.11)
RDW: 19.3 % — ABNORMAL HIGH (ref 11.5–15.5)
WBC: 9.3 10*3/uL (ref 4.0–10.5)
nRBC: 1.1 % — ABNORMAL HIGH (ref 0.0–0.2)

## 2018-11-13 LAB — COMPREHENSIVE METABOLIC PANEL
ALT: 14 U/L (ref 0–44)
AST: 23 U/L (ref 15–41)
Albumin: 1.9 g/dL — ABNORMAL LOW (ref 3.5–5.0)
Alkaline Phosphatase: 329 U/L — ABNORMAL HIGH (ref 38–126)
Anion gap: 9 (ref 5–15)
BUN: 8 mg/dL (ref 6–20)
CO2: 31 mmol/L (ref 22–32)
Calcium: 7.6 mg/dL — ABNORMAL LOW (ref 8.9–10.3)
Chloride: 93 mmol/L — ABNORMAL LOW (ref 98–111)
Creatinine, Ser: 0.71 mg/dL (ref 0.44–1.00)
GFR calc Af Amer: >60 mL/min (ref >60)
GFR calc non Af Amer: >60 mL/min (ref >60)
Glucose, Bld: 135 mg/dL — ABNORMAL HIGH (ref 70–99)
Potassium: 3.3 mmol/L — ABNORMAL LOW (ref 3.5–5.1)
Sodium: 133 mmol/L — ABNORMAL LOW (ref 135–145)
Total Bilirubin: 0.8 mg/dL (ref 0.3–1.2)
Total Protein: 6.3 g/dL — ABNORMAL LOW (ref 6.5–8.1)

## 2018-11-13 LAB — PREPARE RBC (CROSSMATCH)

## 2018-11-13 MED ORDER — ACETAMINOPHEN 325 MG PO TABS
ORAL_TABLET | ORAL | Status: AC
  Filled 2018-11-13: qty 2

## 2018-11-13 MED ORDER — SODIUM CHLORIDE 0.9 % IV SOLN
INTRAVENOUS | Status: DC
  Administered 2018-11-13: 13:00:00 via INTRAVENOUS
  Filled 2018-11-13: qty 250

## 2018-11-13 MED ORDER — DIPHENHYDRAMINE HCL 25 MG PO CAPS
ORAL_CAPSULE | ORAL | Status: AC
  Filled 2018-11-13: qty 1

## 2018-11-13 MED ORDER — ACETAMINOPHEN 325 MG PO TABS
650.0000 mg | ORAL_TABLET | Freq: Once | ORAL | Status: AC
  Administered 2018-11-13: 650 mg via ORAL

## 2018-11-13 MED ORDER — FUROSEMIDE 10 MG/ML IJ SOLN
INTRAMUSCULAR | Status: AC
  Filled 2018-11-13: qty 2

## 2018-11-13 MED ORDER — DIPHENHYDRAMINE HCL 25 MG PO CAPS
25.0000 mg | ORAL_CAPSULE | Freq: Once | ORAL | Status: AC
  Administered 2018-11-13: 25 mg via ORAL

## 2018-11-13 MED ORDER — DENOSUMAB 120 MG/1.7ML ~~LOC~~ SOLN
SUBCUTANEOUS | Status: AC
  Filled 2018-11-13: qty 1.7

## 2018-11-13 MED ORDER — DENOSUMAB 120 MG/1.7ML ~~LOC~~ SOLN
120.0000 mg | Freq: Once | SUBCUTANEOUS | Status: AC
  Administered 2018-11-13: 120 mg via SUBCUTANEOUS

## 2018-11-13 MED ORDER — FUROSEMIDE 10 MG/ML IJ SOLN
20.0000 mg | Freq: Once | INTRAMUSCULAR | Status: AC
  Administered 2018-11-13: 20 mg via INTRAVENOUS

## 2018-11-13 NOTE — Progress Notes (Signed)
[facility] CSW Progress Notes  Crystal Simona Huh is Estate manager/land agent assigned to case, email sent to her to request she reach out to patient again to assist w Medicaid application as numbers in chart were incorrect.  [name], LCSW Clinical Social Worker Phone:  [phone]

## 2018-11-13 NOTE — Progress Notes (Signed)
Patient saturations on room air at rest = 96%  Patient saturations on room air while ambulating = 95%  Patient saturations on 0 liters of oxygen while ambulating = 93%

## 2018-11-13 NOTE — Progress Notes (Signed)
[place]  [phone] [phone]   ID: Suzanne [name] DOB: 07/11/1960  MR#: [id number]  JYN#:[id number]  Patient Care Team: Patient, No Pcp Per as PCP - General ([facility]) Suzanne [name] as Consulting Physician (Oncology) OTHER [name]:   CHIEF COMPLAINT: Stage IV estrogen receptor positive breast cancer  CURRENT TREATMENT: Anastrozole/Palbociclib/Xgeva   INTERVAL HISTORY: Suzanne [name] returns today for follow-up and treatment of her Metastatic cancer. She is over an hour late for her appointment because she is hard of hearing and did not hear the staff call her to check in.  We also called Suzanne [name]'s daughter Suzanne [name] on the phone throughout the appointment.  Suzanne [name] was brought here by her daughter (who has cerebral palsy) and sister.    She was started on Anastrozole in early June, 2020.  She says that she tolerates this well and hasn't noted hot flashes, arthralgias, or vaginal dryness.  She is due to start Palbociclib at 154m daily x 21 days.  Suzanne [name] PharmD and her associate Suzanne Schneidersare assisting with the paperwork.    LCornishais also starting XNigertoday.  She denies any dental concerns.   REVIEW OF SYSTEMS: Suzanne [name] that her pain is better since starting the Anastrozole.  She also takes Oxycodone 2.573m5mg every 6 hours as needed.  This controls her pain.  She notes that she hasn't had a bowel movement in 2 weeks and says that she isn't sure what to take for it.    Suzanne [name] shortness of breath.  Her shortness of breath is getting slightly worse.  She wants to know if there was ever a decision made about oxygen for her because she thinks that she would sleep better at night if she had oxygen.  The home health nurses are working on getting her a hospital bed, as that may help with her discomfort at night.    Suzanne [name] her daughter Suzanne [name] that home health nurses have talked to her about hospice.  They want to know what our  opinion is regarding hospice at this point.    Suzanne [name] any fever or chills.  She is without any chest pain or palpitations.  She hasn't had any nausea, vomiting, bladder issues.  A detailed ROS was otherwise non contributory.     HISTORY OF CURRENT ILLNESS: From the original inpatient consult note:  [name] 58[place] with exception of obesity, remote history of gallstones, and remote history of mastitis.  The patient presented to the hospital on 10/26/2018 with a 3-week history of worsening shortness of breath and lower extremity edema.  The patient reported that her shortness of breath worsened with exertion.  Her lower extremity edema did not improve with elevation.  She also had an intermittent productive cough with rare sputum production.  The patient was noted to be anemic with a hemoglobin of 4.5 with a clumped platelets noted on admission.  Stool for occult blood was positive.  Patient received 2 units of packed red blood cells.    The patient reported a rash under her breast x1 week which has been foul-smelling.  She also reported that she has some discomfort and thickening/hardness of her right breast for several months.  She states that her right breast is much larger than her left.  She notes some occasional bloody discharge from her breast once in a while.  A chest x-ray on admission showed diffuse bilateral  interstitial opacity of unknown chronicity, findings could be related to interstitial inflammatory process, atypical/viral pneumonia, or possible metastatic disease.  There are also sclerotic and lytic lesions within the clavicles, bilateral ribs, shoulders which was concerning for diffuse few skeletal metastatic disease.    This prompted a CT scan of the chest with contrast which showed bilateral axillary and mediastinal adenopathy, borderline hilar lymph nodes bilaterally, findings likely  flecked metastatic lymphadenopathy, moderate bilateral pleural effusions, extensive interstitial thickening and reticulonodular opacities throughout the lungs, cannot exclude lymphangitic spread of tumor, diffuse skeletal metastases.  The patient's subsequent history is as detailed below.   PAST MEDICAL HISTORY: Past Medical History:  Diagnosis Date  . Gallstones   . Mastitis    reports history of recurrent mastitis  . Obesity     PAST SURGICAL HISTORY: Past Surgical History:  Procedure Laterality Date  . IR THORACENTESIS ASP PLEURAL SPACE W/IMG GUIDE  10/28/2018  . IR THORACENTESIS ASP PLEURAL SPACE W/IMG GUIDE  10/29/2018    FAMILY HISTORY: Family History  Problem Relation Age of Onset  . Breast cancer Sister        in her 71's-70s  . Heart disease Brother        CABG  . Heart disease Brother        CABG  . Skin cancer Brother        melanoma  . Colon cancer Cousin   . Heart attack Mother   . Heart attack Father   . Prostate cancer Brother   Patient's father passed away at age 32, and her mother at 36, both from heart attacks. The patient has 8 siblings, 6 brothers and 1 sister.  Her sister was diagnosed with breast cancer. One brother has prostate cancer, another brother had a melanoma removed.    GYNECOLOGIC HISTORY:  Menarche: 58 years old Age at first live birth: 58 years old [place] P 1 LMP age 7 Contraceptive: used for 2-3 years with no problems HRT never used  Hysterectomy? 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 She is widowed. She lives at home with her daughter. Daughter Suzanne [name], age 52, is a Psychologist, occupational at the science center.  The patient has no grandchildren. She is not a Ambulance person.   ADVANCED DIRECTIVES: not on file; she has the paperwork already. She intends to name her daughter as her HCPOA.   HEALTH MAINTENANCE: Social History   Tobacco Use  . Smoking status: Never Smoker  . Smokeless tobacco: Never Used   Substance Use Topics  . Alcohol use: No  . Drug use: No    Colonoscopy:   PAP:   Bone density:  Mammography:   No Known Allergies  Current Outpatient Medications  Medication Sig Dispense Refill  . anastrozole (ARIMIDEX) 1 MG tablet Take 1 tablet (1 mg total) by mouth daily. 30 tablet 0  . clotrimazole (LOTRIMIN) 1 % cream Apply topically 2 (two) times daily. For 2-4 weeks. 30 g 0  . furosemide (LASIX) 20 MG tablet Take 1 tablet (20 mg total) by mouth daily. 30 tablet 1  . oxyCODONE (OXY IR/ROXICODONE) 5 MG immediate release tablet Take 0.5 tablets (2.5 mg total) by mouth every 4 (four) hours as needed for moderate pain or severe pain. 60 tablet 0  . palbociclib (IBRANCE) 125 MG capsule Take 1 capsule (125 mg total) by mouth daily with breakfast. Take whole with food. Take for 21 days on, 7 days off, repeat every 28 days. 21 capsule  6   Current Facility-Administered Medications  Medication Dose Route Frequency Provider Last Rate Last Dose  . 0.9 %  sodium chloride infusion   Intravenous Continuous Gardenia Phlegm, NP 10 mL/hr at 11/13/18 1311    . furosemide (LASIX) injection 20 mg  20 mg Intravenous Once Gardenia Phlegm, NP         OBJECTIVE:   Vitals:   11/13/18 0956 11/13/18 1313  BP: 137/74 (!) 131/57  Pulse: (!) 115 (!) 108  Resp: 18 20  Temp: 99.8 F (37.7 C) 99 F (37.2 C)  SpO2: 90% 94%     There is no height or weight on file to calculate BMI.   Wt Readings from Last 3 Encounters:  10/30/18 234 lb 2.1 oz (106.2 kg)  02/21/11 235 lb (106.6 kg)  ECOG FS:2 GENERAL: Patient is morbidly obese, appears short of breath, chronically ill Examined in wheelchair, exam limited due to disability and body habitus HEENT:  Sclerae anicteric.  Oropharynx clear and moist. No ulcerations or evidence of oropharyngeal candidiasis. Neck is supple.  NODES:  No cervical, supraclavicular, or axillary lymphadenopathy palpated.  BREAST EXAM:  Deferred. LUNGS:   Diminished breath sounds in left base.  No wheezes or rhonchi. HEART:  Regular rate and rhythm. No murmur appreciated. ABDOMEN:  Soft, nontender.  Positive, normoactive bowel sounds. No organomegaly palpated. MSK:  No focal spinal tenderness to palpation.  EXTREMITIES:  No peripheral edema.   SKIN:  Clear with no obvious rashes or skin changes. No nail dyscrasia. NEURO:  Nonfocal. Well oriented.  Appropriate affect.     LAB RESULTS:  CMP     Component Value Date/Time   NA 133 (L) 11/13/2018 0941   K 3.3 (L) 11/13/2018 0941   CL 93 (L) 11/13/2018 0941   CO2 31 11/13/2018 0941   GLUCOSE 135 (H) 11/13/2018 0941   BUN 8 11/13/2018 0941   CREATININE 0.71 11/13/2018 0941   CREATININE 0.72 11/06/2018 1528   CALCIUM 7.6 (L) 11/13/2018 0941   PROT 6.3 (L) 11/13/2018 0941   ALBUMIN 1.9 (L) 11/13/2018 0941   AST 23 11/13/2018 0941   AST 24 11/06/2018 1528   ALT 14 11/13/2018 0941   ALT 15 11/06/2018 1528   ALKPHOS 329 (H) 11/13/2018 0941   BILITOT 0.8 11/13/2018 0941   BILITOT 0.8 11/06/2018 1528   GFRNONAA >60 11/13/2018 0941   GFRNONAA >60 11/06/2018 1528   GFRAA >60 11/13/2018 0941   GFRAA >60 11/06/2018 1528    No results found for: TOTALPROTELP, ALBUMINELP, A1GS, A2GS, BETS, BETA2SER, GAMS, MSPIKE, SPEI  No results found for: KPAFRELGTCHN, LAMBDASER, KAPLAMBRATIO  Lab Results  Component Value Date   WBC 9.3 11/13/2018   NEUTROABS 6.0 11/13/2018   HGB 6.6 (LL) 11/13/2018   HCT 20.8 (L) 11/13/2018   MCV 99.0 11/13/2018   PLT 145 (L) 11/13/2018    '@LASTCHEMISTRY' @  No results found for: LABCA2  No components found for: CVUDTH438  No results for input(s): INR in the last 168 hours.  No results found for: LABCA2  No results found for: OIL579  No results found for: JKQ206  No results found for: ORV615  Lab Results  Component Value Date   CA2729 978.7 (H) 11/07/2018    No components found for: HGQUANT  No results found for: CEA1 / No results found for:  CEA1   No results found for: AFPTUMOR  No results found for: [place]  No results found for: [place] Visit on 11/13/2018  Component  Date Value Ref Range Status  . Order Confirmation 11/13/2018    Final                   Value:ORDER PROCESSED BY BLOOD BANK Performed at [facility], [place] [address], [place], [place] [address]   Appointment on 11/13/2018  Component Date Value Ref Range Status  . WBC 11/13/2018 9.3  4.0 - 10.5 K/uL Final  . RBC 11/13/2018 2.10* 3.87 - 5.11 MIL/uL Final  . Hemoglobin 11/13/2018 6.6* 12.0 - 15.0 g/dL Final   This critical result has verified and been called to [facility] by Diamantina Monks on 06 18 2020 at 1013, and has been read back.   Marland Kitchen HCT 11/13/2018 20.8* 36.0 - 46.0 % Final  . MCV 11/13/2018 99.0  80.0 - 100.0 fL Final  . MCH 11/13/2018 31.4  26.0 - 34.0 pg Final  . MCHC 11/13/2018 31.7  30.0 - 36.0 g/dL Final  . RDW 11/13/2018 19.3* 11.5 - 15.5 % Final  . Platelets 11/13/2018 145* 150 - 400 K/uL Final  . nRBC 11/13/2018 1.1* 0.0 - 0.2 % Final  . Neutrophils Relative % 11/13/2018 65  % Final  . Neutro Abs 11/13/2018 6.0  1.7 - 7.7 K/uL Final  . Lymphocytes Relative 11/13/2018 25  % Final  . Lymphs Abs 11/13/2018 2.3  0.7 - 4.0 K/uL Final  . Monocytes Relative 11/13/2018 4  % Final  . Monocytes Absolute 11/13/2018 0.4  0.1 - 1.0 K/uL Final  . Eosinophils Relative 11/13/2018 1  % Final  . Eosinophils Absolute 11/13/2018 0.1  0.0 - 0.5 K/uL Final  . Basophils Relative 11/13/2018 0  % Final  . Basophils Absolute 11/13/2018 0.0  0.0 - 0.1 K/uL Final  . Immature Granulocytes 11/13/2018 5  % Final  . Abs Immature Granulocytes 11/13/2018 0.42* 0.00 - 0.07 K/uL Final   Performed at [facility], [place] [address], [place], [place] [address]  . Sodium 11/13/2018 133* 135 - 145 mmol/L Final  . Potassium 11/13/2018 3.3* 3.5 - 5.1 mmol/L Final  . Chloride 11/13/2018 93* 98 - 111 mmol/L Final  .  CO2 11/13/2018 31  22 - 32 mmol/L Final  . Glucose, Bld 11/13/2018 135* 70 - 99 mg/dL Final  . BUN 11/13/2018 8  6 - 20 mg/dL Final  . Creatinine, Ser 11/13/2018 0.71  0.44 - 1.00 mg/dL Final  . Calcium 11/13/2018 7.6* 8.9 - 10.3 mg/dL Final  . Total Protein 11/13/2018 6.3* 6.5 - 8.1 g/dL Final  . Albumin 11/13/2018 1.9* 3.5 - 5.0 g/dL Final  . AST 11/13/2018 23  15 - 41 U/L Final  . ALT 11/13/2018 14  0 - 44 U/L Final  . Alkaline Phosphatase 11/13/2018 329* 38 - 126 U/L Final  . Total Bilirubin 11/13/2018 0.8  0.3 - 1.2 mg/dL Final  . GFR calc non Af Amer 11/13/2018 >60  >60 mL/min Final  . GFR calc Af Amer 11/13/2018 >60  >60 mL/min Final  . Anion gap 11/13/2018 9  5 - 15 Final   Performed at [facility], [place] [address], [place], [place] [address]  . ABO/RH(D) 11/13/2018 O POS   Final  . Antibody Screen 11/13/2018 NEG   Final  . Sample Expiration 11/13/2018 11/16/2018,2359   Final  . Unit Number 11/13/2018 [id number]   Final  . Blood Component Type 11/13/2018 RED CELLS,LR   Final  . Unit division 11/13/2018 00   Final  .  Status of Unit 11/13/2018 ISSUED   Final  . Transfusion Status 11/13/2018 OK TO TRANSFUSE   Final  . Crossmatch Result 11/13/2018    Final                   Value:Compatible Performed at [facility], [place] [address], [place], [place] [address]   . Unit Number 11/13/2018 [id number]   Final  . Blood Component Type 11/13/2018 RED CELLS,LR   Final  . Unit division 11/13/2018 00   Final  . Status of Unit 11/13/2018 ISSUED   Final  . Transfusion Status 11/13/2018 OK TO TRANSFUSE   Final  . Crossmatch Result 11/13/2018 Compatible   Final  . ISSUE DATE / TIME 11/13/2018 [id number]   Final  . Blood Product Unit Number 11/13/2018 [id number]   Final  . PRODUCT CODE 11/13/2018 [id number]   Final  . Unit Type and Rh 11/13/2018 5100   Final  . Blood Product Expiration Date 11/13/2018 [id number]   Final  . ISSUE DATE /  TIME 11/13/2018 [id number]   Final  . Blood Product Unit Number 11/13/2018 [id number]   Final  . PRODUCT CODE 11/13/2018 [id number]   Final  . Unit Type and Rh 11/13/2018 5100   Final  . Blood Product Expiration Date 11/13/2018 [id number]   Final    (this displays the last labs from the last 3 days)  No results found for: TOTALPROTELP, ALBUMINELP, A1GS, A2GS, BETS, BETA2SER, GAMS, MSPIKE, SPEI (this displays SPEP labs)  No results found for: KPAFRELGTCHN, LAMBDASER, KAPLAMBRATIO (kappa/lambda light chains)  No results found for: HGBA, HGBA2QUANT, HGBFQUANT, HGBSQUAN (Hemoglobinopathy evaluation)   Lab Results  Component Value Date   LDH 312 (H) 10/27/2018    Lab Results  Component Value Date   IRON 137 10/26/2018   TIBC 384 10/26/2018   IRONPCTSAT 36 (H) 10/26/2018   (Iron and TIBC)  Lab Results  Component Value Date   FERRITIN 797 (H) 10/26/2018    Urinalysis    Component Value Date/Time   COLORURINE YELLOW 10/27/2018 2346   APPEARANCEUR HAZY (A) 10/27/2018 2346   LABSPEC 1.038 (H) 10/27/2018 2346   PHURINE 5.0 10/27/2018 [place] 10/27/2018 2346   HGBUR NEGATIVE 10/27/2018 2346   BILIRUBINUR NEGATIVE 10/27/2018 2346   KETONESUR 5 (A) 10/27/2018 2346   PROTEINUR NEGATIVE 10/27/2018 2346   NITRITE NEGATIVE 10/27/2018 2346   LEUKOCYTESUR NEGATIVE 10/27/2018 2346     STUDIES:     ELIGIBLE FOR AVAILABLE RESEARCH PROTOCOL: no   ASSESSMENT: 58 y.o. [place], [place] woman presenting 10/26/2018 with right-sided inflammatory breast cancer, stage IV, involvnh lungs, lymph nodes and bones, as follows:  (a) chest CT scan 10/26/2018 shows bilateral pleural effusions, possible lymphangitic spread of tumor, diffuse bony metastatic disease, and significant axillary mediastinal and hilar adenopathy  (b) bone scan 10/27/2018 is a "near [place]" consistent with widespread bony metastatic disease  (c) head CT with and without contrast  10/30/2018 shows no intracranial metastatic disease, multiple calvarial lesions  (d) CA-27-29 on 10/27/2018 was 1810.4  (1) pleural fluid from right thoracentesis 10/28/2018 confirms malignant cells consistent with a breast primary, strongly estrogen and progesterone receptor positive, HER-2 not amplified, with an MIB-1 of 2%  (2) anastrozole started 10/29/2018  (a) palbociclib to start 11/13/2018  (b) denosumab/xgeva to start 11/13/2018  (3) associated problems:  (a) hypoxia secondary to effusions  (b) pain from bone lesions  (c) right upper extremity lymphedema  (d) anemia, requiring transfusion  (e) poor  venous access  (4) genetics testing pending     PLAN:  [place] is doing ok for the most part today considering her recent diagnosis of metastatic breast cancer.  She has several issues today so I am going to list them one by one.  1. Metastatic breast cancer: Taking anastrozole daily, tolerating well.  She will continue this.  She will start Palbociclib once she receives it at 127m  Daily.    2. Bone metastases: starting xgeva today.  Counseled about calcium and taking tums on day of injection.  3. Malignant pleural effusion: repeat chest xray shows re accumulation of fluid.  Patient will need repeat thoracentesis.    4. Pain: secondary to bone metastases, controlled with Oxycodone. She notes it does not make her too tired or sleepy, but it does manage the pain.  I recommended she continue this.  5. Shortness of breath: secondary to her pleural effusion and anemia.  We will get thoracentesis and are checking her oxygen saturation so that she will hopefully be eligible for oxygen.    6. Anemia: Her hemoglobin is 6.6 today.  She was written to receive 2 units of PRBCs with tylenol and benadryl beforehand and Lasix in between.    7. Debilitation: Patient is physically debilitated due to her obesity and deconditioning from her diagnosis and recent hospitalization.  She is working  with home health.  I have consulted social work to help u[place] as patient has no insurance, and has several needs that we can assist with.  AEdwyna Shell is her sEducation officer, museum    LElverawill return in one week for labs and follow up.  I have scheduled her to receive blood as well.  She and her daughter know to call for any questions or concerns prior to her next appointment with u[place]    A total of (70) minutes of face-to-face time was spent with this patient with greater than 50% of that time in counseling and care-coordination.   LWilber Bihari NP  11/13/18 1:20 PM Medical Oncology and Hematology [facility] [id number]. [phone]   Fax. [phone]

## 2018-11-13 NOTE — Telephone Encounter (Signed)
I contacted Pt and she is setup with transportation services and will call when she will need transportation assistance. For now she has her aunt helping her with taking Pt to and from the [place]

## 2018-11-13 NOTE — Progress Notes (Signed)
[place] CSW Progress Notes  Request received from APP Wilber Bihari to help w patient's needs.  CSW Wallis Bamberg is on site to respond, was advised that CSW could respond as needed.  Patients needs may include transportation, insurance and possible disability/income support needs.  Spoke w Di Kindle, Solicitor - they have connected w patient and enrolled her in [place] for [employer].  Spoke w daughter, pt brought SCAT app to [facility] this AM.  CSWs can submit to SCAT for help w transport to other appts.  Per Wilber Bihari, pt has not completed her part of SCAT app.  Pt will need SCAT for transport to non [facility] appts.  Pt lives w daughter who is disabled.  Stable living situation, has been in current dwelling approx 4 years.  Daughter concerned that pt may need ramp to exit home - is asking landlord for help w this.  Has SW [facility] from [facility], arranged on hospital discharge.  HH SW has discussed hospice referral w pt and daughter.  Pt has been working from home - plans/hopes to continue this when feeling well enough to do so.  No current application on file w [place] for disability income support.  Unclear whether patient has pending Medicaid application - have asked [employer] to determine if this has been initiated while pt was inpatient.  Stressed to daughter that pt needs to return call to worker trying to assist w filing for Medicaid.  Per daughter, phone numbers were not correct in chart and patient has not gone through messages left on cell phone.  CSW will call patient tomorrow to determine additional needs.  [name], LCSW Clinical Social Worker Phone:  [phone]

## 2018-11-13 NOTE — Telephone Encounter (Signed)
Oral Oncology Patient Advocate Encounter:  Saw patient at clinic today, received signatures for [place] patient assistance forms for [place]. Patient brought in 2018 tax income documents, she will email or fax over 2019 documents once she finds them. We will obtain provider signature and will go ahead and submit application to [place].  Phone# [phone]  11:16 AM [name] Chancellor, CPhT

## 2018-11-13 NOTE — Telephone Encounter (Signed)
Oral Oncology Patient Advocate Encounter:  Submitted Patient Media planner to [employer] for [employer]. Will follow up to confirm receipt and follow up on status.    Fax# [phone] Phone# [phone]  11:55 AM [name] Chancellor, CPhT

## 2018-11-13 NOTE — Telephone Encounter (Signed)
Received call report from [facility].  "Today's HGB = 6.6."  Secure Chat sent with results.     10:18 am.  Received read confirmation.

## 2018-11-13 NOTE — Patient Instructions (Addendum)
Blood Transfusion, Adult, Care After This sheet gives you information about how to care for yourself after your procedure. Your doctor may also give you more specific instructions. If you have problems or questions, contact your doctor. Follow these instructions at home:   Take over-the-counter and prescription medicines only as told by your doctor.  Go back to your normal activities as told by your doctor.  Follow instructions from your doctor about how to take care of the area where an IV tube was put into your vein (insertion site). Make sure you: ? Wash your hands with soap and water before you change your bandage (dressing). If there is no soap and water, use hand sanitizer. ? Change your bandage as told by your doctor.  Check your IV insertion site every day for signs of infection. Check for: ? More redness, swelling, or pain. ? More fluid or blood. ? Warmth. ? Pus or a bad smell. Contact a doctor if:  You have more redness, swelling, or pain around the IV insertion site.  You have more fluid or blood coming from the IV insertion site.  Your IV insertion site feels warm to the touch.  You have pus or a bad smell coming from the IV insertion site.  Your pee (urine) turns pink, red, or brown.  You feel weak after doing your normal activities. Get help right away if:  You have signs of a serious allergic or body defense (immune) system reaction, including: ? Itchiness. ? Hives. ? Trouble breathing. ? Anxiety. ? Pain in your chest or lower back. ? Fever, flushing, and chills. ? Fast pulse. ? Rash. ? Watery poop (diarrhea). ? Throwing up (vomiting). ? Dark pee. ? Serious headache. ? Dizziness. ? Stiff neck. ? Yellow color in your face or the white parts of your eyes (jaundice). Summary  After a blood transfusion, return to your normal activities as told by your doctor.  Every day, check for signs of infection where the IV tube was put into your vein.  Some  signs of infection are warm skin, more redness and pain, more fluid or blood, and pus or a bad smell where the needle went in.  Contact your doctor if you feel weak or have any unusual symptoms. This information is not intended to replace advice given to you by your health care provider. Make sure you discuss any questions you have with your health care provider. Document Released: 06/04/2014 Document Revised: 01/06/2016 Document Reviewed: 01/06/2016 Elsevier Interactive Patient Education  2019 Elsevier Inc.    Denosumab injection What is this medicine? DENOSUMAB (den oh sue mab) slows bone breakdown. Prolia is used to treat osteoporosis in women after menopause and in men, and in people who are taking corticosteroids for 6 months or more. Delton See is used to treat a high calcium level due to cancer and to prevent bone fractures and other bone problems caused by multiple myeloma or cancer bone metastases. Delton See is also used to treat giant cell tumor of the bone. This medicine may be used for other purposes; ask your health care provider or pharmacist if you have questions. COMMON BRAND NAME(S): Prolia, XGEVA What should I tell my health care provider before I take this medicine? They need to know if you have any of these conditions: -dental disease -having surgery or tooth extraction -infection -kidney disease -low levels of calcium or Vitamin D in the blood -malnutrition -on hemodialysis -skin conditions or sensitivity -thyroid or parathyroid disease -an unusual reaction to denosumab,  other medicines, foods, dyes, or preservatives -pregnant or trying to get pregnant -breast-feeding How should I use this medicine? This medicine is for injection under the skin. It is given by a health care professional in a hospital or clinic setting. A special MedGuide will be given to you before each treatment. Be sure to read this information carefully each time. For Prolia, talk to your pediatrician  regarding the use of this medicine in children. Special care may be needed. For Delton See, talk to your pediatrician regarding the use of this medicine in children. While this drug may be prescribed for children as young as 13 years for selected conditions, precautions do apply. Overdosage: If you think you have taken too much of this medicine contact a poison control center or emergency room at once. NOTE: This medicine is only for you. Do not share this medicine with others. What if I miss a dose? It is important not to miss your dose. Call your doctor or health care professional if you are unable to keep an appointment. What may interact with this medicine? Do not take this medicine with any of the following medications: -other medicines containing denosumab This medicine may also interact with the following medications: -medicines that lower your chance of fighting infection -steroid medicines like prednisone or cortisone This list may not describe all possible interactions. Give your health care provider a list of all the medicines, herbs, non-prescription drugs, or dietary supplements you use. Also tell them if you smoke, drink alcohol, or use illegal drugs. Some items may interact with your medicine. What should I watch for while using this medicine? Visit your doctor or health care professional for regular checks on your progress. Your doctor or health care professional may order blood tests and other tests to see how you are doing. Call your doctor or health care professional for advice if you get a fever, chills or sore throat, or other symptoms of a cold or flu. Do not treat yourself. This drug may decrease your body's ability to fight infection. Try to avoid being around people who are sick. You should make sure you get enough calcium and vitamin D while you are taking this medicine, unless your doctor tells you not to. Discuss the foods you eat and the vitamins you take with your health care  professional. See your dentist regularly. Brush and floss your teeth as directed. Before you have any dental work done, tell your dentist you are receiving this medicine. Do not become pregnant while taking this medicine or for 5 months after stopping it. Talk with your doctor or health care professional about your birth control options while taking this medicine. Women should inform their doctor if they wish to become pregnant or think they might be pregnant. There is a potential for serious side effects to an unborn child. Talk to your health care professional or pharmacist for more information. What side effects may I notice from receiving this medicine? Side effects that you should report to your doctor or health care professional as soon as possible: -allergic reactions like skin rash, itching or hives, swelling of the face, lips, or tongue -bone pain -breathing problems -dizziness -jaw pain, especially after dental work -redness, blistering, peeling of the skin -signs and symptoms of infection like fever or chills; cough; sore throat; pain or trouble passing urine -signs of low calcium like fast heartbeat, muscle cramps or muscle pain; pain, tingling, numbness in the hands or feet; seizures -unusual bleeding or bruising -unusually weak  or tired Side effects that usually do not require medical attention (report to your doctor or health care professional if they continue or are bothersome): -constipation -diarrhea -headache -joint pain -loss of appetite -muscle pain -runny nose -tiredness -upset stomach This list may not describe all possible side effects. Call your doctor for medical advice about side effects. You may report side effects to FDA at 1-800-FDA-1088. Where should I keep my medicine? This medicine is only given in a clinic, doctor's office, or other health care setting and will not be stored at home. NOTE: This sheet is a summary. It may not cover all possible  information. If you have questions about this medicine, talk to your doctor, pharmacist, or health care provider.  2019 Elsevier/Gold Standard (2017-09-20 16:10:44)

## 2018-11-14 ENCOUNTER — Encounter: Payer: Self-pay | Admitting: General Practice

## 2018-11-14 ENCOUNTER — Inpatient Hospital Stay (HOSPITAL_COMMUNITY)
Admission: EM | Admit: 2018-11-14 | Discharge: 2018-12-01 | DRG: 189 | Disposition: A | Discharge status: Home Health | Payer: Medicaid Other | Attending: Internal Medicine | Admitting: Internal Medicine

## 2018-11-14 ENCOUNTER — Telehealth: Payer: Self-pay | Admitting: Pharmacist

## 2018-11-14 ENCOUNTER — Other Ambulatory Visit: Payer: Self-pay

## 2018-11-14 ENCOUNTER — Emergency Department (HOSPITAL_COMMUNITY): Payer: Medicaid Other

## 2018-11-14 ENCOUNTER — Encounter (HOSPITAL_COMMUNITY): Payer: Self-pay

## 2018-11-14 ENCOUNTER — Telehealth: Payer: Self-pay | Admitting: *Deleted

## 2018-11-14 DIAGNOSIS — I509 Heart failure, unspecified: Secondary | ICD-10-CM

## 2018-11-14 DIAGNOSIS — R0602 Shortness of breath: Secondary | ICD-10-CM | POA: Diagnosis not present

## 2018-11-14 DIAGNOSIS — J181 Lobar pneumonia, unspecified organism: Secondary | ICD-10-CM | POA: Diagnosis not present

## 2018-11-14 DIAGNOSIS — I5033 Acute on chronic diastolic (congestive) heart failure: Secondary | ICD-10-CM | POA: Diagnosis not present

## 2018-11-14 DIAGNOSIS — Z9889 Other specified postprocedural states: Secondary | ICD-10-CM

## 2018-11-14 DIAGNOSIS — J90 Pleural effusion, not elsewhere classified: Secondary | ICD-10-CM | POA: Insufficient documentation

## 2018-11-14 DIAGNOSIS — L8990 Pressure ulcer of unspecified site, unspecified stage: Secondary | ICD-10-CM | POA: Insufficient documentation

## 2018-11-14 DIAGNOSIS — C7951 Secondary malignant neoplasm of bone: Secondary | ICD-10-CM | POA: Diagnosis present

## 2018-11-14 DIAGNOSIS — C779 Secondary and unspecified malignant neoplasm of lymph node, unspecified: Secondary | ICD-10-CM | POA: Diagnosis present

## 2018-11-14 DIAGNOSIS — J9621 Acute and chronic respiratory failure with hypoxia: Principal | ICD-10-CM | POA: Diagnosis present

## 2018-11-14 DIAGNOSIS — Z6841 Body Mass Index (BMI) 40.0 and over, adult: Secondary | ICD-10-CM | POA: Diagnosis not present

## 2018-11-14 DIAGNOSIS — T502X5A Adverse effect of carbonic-anhydrase inhibitors, benzothiadiazides and other diuretics, initial encounter: Secondary | ICD-10-CM | POA: Diagnosis present

## 2018-11-14 DIAGNOSIS — M8448XA Pathological fracture, other site, initial encounter for fracture: Secondary | ICD-10-CM | POA: Diagnosis present

## 2018-11-14 DIAGNOSIS — C7802 Secondary malignant neoplasm of left lung: Secondary | ICD-10-CM | POA: Diagnosis not present

## 2018-11-14 DIAGNOSIS — Z8042 Family history of malignant neoplasm of prostate: Secondary | ICD-10-CM | POA: Diagnosis not present

## 2018-11-14 DIAGNOSIS — D509 Iron deficiency anemia, unspecified: Secondary | ICD-10-CM | POA: Diagnosis present

## 2018-11-14 DIAGNOSIS — R21 Rash and other nonspecific skin eruption: Secondary | ICD-10-CM | POA: Diagnosis present

## 2018-11-14 DIAGNOSIS — Z5329 Procedure and treatment not carried out because of patient's decision for other reasons: Secondary | ICD-10-CM | POA: Diagnosis not present

## 2018-11-14 DIAGNOSIS — Z8249 Family history of ischemic heart disease and other diseases of the circulatory system: Secondary | ICD-10-CM | POA: Diagnosis not present

## 2018-11-14 DIAGNOSIS — Z79899 Other long term (current) drug therapy: Secondary | ICD-10-CM

## 2018-11-14 DIAGNOSIS — J690 Pneumonitis due to inhalation of food and vomit: Secondary | ICD-10-CM | POA: Clinically undetermined

## 2018-11-14 DIAGNOSIS — R197 Diarrhea, unspecified: Secondary | ICD-10-CM | POA: Diagnosis present

## 2018-11-14 DIAGNOSIS — J9811 Atelectasis: Secondary | ICD-10-CM | POA: Diagnosis not present

## 2018-11-14 DIAGNOSIS — D539 Nutritional anemia, unspecified: Secondary | ICD-10-CM | POA: Diagnosis present

## 2018-11-14 DIAGNOSIS — C50911 Malignant neoplasm of unspecified site of right female breast: Secondary | ICD-10-CM

## 2018-11-14 DIAGNOSIS — Z20828 Contact with and (suspected) exposure to other viral communicable diseases: Secondary | ICD-10-CM | POA: Diagnosis not present

## 2018-11-14 DIAGNOSIS — Z7189 Other specified counseling: Secondary | ICD-10-CM | POA: Diagnosis not present

## 2018-11-14 DIAGNOSIS — S42001A Fracture of unspecified part of right clavicle, initial encounter for closed fracture: Secondary | ICD-10-CM | POA: Diagnosis present

## 2018-11-14 DIAGNOSIS — K5900 Constipation, unspecified: Secondary | ICD-10-CM | POA: Diagnosis present

## 2018-11-14 DIAGNOSIS — D649 Anemia, unspecified: Secondary | ICD-10-CM | POA: Diagnosis not present

## 2018-11-14 DIAGNOSIS — T50995A Adverse effect of other drugs, medicaments and biological substances, initial encounter: Secondary | ICD-10-CM | POA: Diagnosis not present

## 2018-11-14 DIAGNOSIS — Z803 Family history of malignant neoplasm of breast: Secondary | ICD-10-CM

## 2018-11-14 DIAGNOSIS — E8771 Transfusion associated circulatory overload: Secondary | ICD-10-CM | POA: Diagnosis not present

## 2018-11-14 DIAGNOSIS — Z515 Encounter for palliative care: Secondary | ICD-10-CM

## 2018-11-14 DIAGNOSIS — S42021A Displaced fracture of shaft of right clavicle, initial encounter for closed fracture: Secondary | ICD-10-CM | POA: Diagnosis not present

## 2018-11-14 DIAGNOSIS — Y92239 Unspecified place in hospital as the place of occurrence of the external cause: Secondary | ICD-10-CM | POA: Diagnosis not present

## 2018-11-14 DIAGNOSIS — Z79891 Long term (current) use of opiate analgesic: Secondary | ICD-10-CM

## 2018-11-14 DIAGNOSIS — F419 Anxiety disorder, unspecified: Secondary | ICD-10-CM | POA: Diagnosis present

## 2018-11-14 DIAGNOSIS — K8020 Calculus of gallbladder without cholecystitis without obstruction: Secondary | ICD-10-CM | POA: Diagnosis not present

## 2018-11-14 DIAGNOSIS — E876 Hypokalemia: Secondary | ICD-10-CM | POA: Diagnosis not present

## 2018-11-14 DIAGNOSIS — I700 Atherosclerosis of aorta: Secondary | ICD-10-CM | POA: Diagnosis not present

## 2018-11-14 DIAGNOSIS — E669 Obesity, unspecified: Secondary | ICD-10-CM

## 2018-11-14 DIAGNOSIS — Z800 Family history of malignant neoplasm of digestive organs: Secondary | ICD-10-CM | POA: Diagnosis not present

## 2018-11-14 DIAGNOSIS — Z808 Family history of malignant neoplasm of other organs or systems: Secondary | ICD-10-CM | POA: Diagnosis not present

## 2018-11-14 DIAGNOSIS — E66812 Obesity, class 2: Secondary | ICD-10-CM

## 2018-11-14 DIAGNOSIS — R591 Generalized enlarged lymph nodes: Secondary | ICD-10-CM | POA: Diagnosis not present

## 2018-11-14 DIAGNOSIS — C7800 Secondary malignant neoplasm of unspecified lung: Secondary | ICD-10-CM | POA: Diagnosis present

## 2018-11-14 DIAGNOSIS — R000 Tachycardia, unspecified: Secondary | ICD-10-CM | POA: Diagnosis present

## 2018-11-14 DIAGNOSIS — S2243XA Multiple fractures of ribs, bilateral, initial encounter for closed fracture: Secondary | ICD-10-CM | POA: Diagnosis not present

## 2018-11-14 DIAGNOSIS — C50811 Malignant neoplasm of overlapping sites of right female breast: Secondary | ICD-10-CM | POA: Diagnosis present

## 2018-11-14 DIAGNOSIS — Z170 Estrogen receptor positive status [ER+]: Secondary | ICD-10-CM | POA: Diagnosis not present

## 2018-11-14 DIAGNOSIS — J9611 Chronic respiratory failure with hypoxia: Secondary | ICD-10-CM | POA: Diagnosis present

## 2018-11-14 DIAGNOSIS — Z4889 Encounter for other specified surgical aftercare: Secondary | ICD-10-CM | POA: Diagnosis not present

## 2018-11-14 DIAGNOSIS — M7989 Other specified soft tissue disorders: Secondary | ICD-10-CM | POA: Diagnosis not present

## 2018-11-14 DIAGNOSIS — D6959 Other secondary thrombocytopenia: Secondary | ICD-10-CM | POA: Diagnosis present

## 2018-11-14 DIAGNOSIS — J910 Malignant pleural effusion: Secondary | ICD-10-CM | POA: Diagnosis not present

## 2018-11-14 DIAGNOSIS — H9190 Unspecified hearing loss, unspecified ear: Secondary | ICD-10-CM | POA: Diagnosis present

## 2018-11-14 DIAGNOSIS — E8809 Other disorders of plasma-protein metabolism, not elsewhere classified: Secondary | ICD-10-CM | POA: Diagnosis not present

## 2018-11-14 DIAGNOSIS — C50919 Malignant neoplasm of unspecified site of unspecified female breast: Secondary | ICD-10-CM | POA: Diagnosis not present

## 2018-11-14 DIAGNOSIS — J9601 Acute respiratory failure with hypoxia: Secondary | ICD-10-CM | POA: Diagnosis not present

## 2018-11-14 DIAGNOSIS — R0902 Hypoxemia: Secondary | ICD-10-CM

## 2018-11-14 DIAGNOSIS — G8929 Other chronic pain: Secondary | ICD-10-CM | POA: Diagnosis present

## 2018-11-14 DIAGNOSIS — C7801 Secondary malignant neoplasm of right lung: Secondary | ICD-10-CM | POA: Diagnosis not present

## 2018-11-14 DIAGNOSIS — Z66 Do not resuscitate: Secondary | ICD-10-CM | POA: Diagnosis present

## 2018-11-14 DIAGNOSIS — E8770 Fluid overload, unspecified: Secondary | ICD-10-CM | POA: Diagnosis not present

## 2018-11-14 HISTORY — DX: Malignant (primary) neoplasm, unspecified: C80.1

## 2018-11-14 HISTORY — DX: Malignant neoplasm of unspecified site of unspecified female breast: C50.919

## 2018-11-14 LAB — COMPREHENSIVE METABOLIC PANEL
ALT: 17 U/L (ref 0–44)
AST: 26 U/L (ref 15–41)
Albumin: 2.2 g/dL — ABNORMAL LOW (ref 3.5–5.0)
Alkaline Phosphatase: 292 U/L — ABNORMAL HIGH (ref 38–126)
Anion gap: 10 (ref 5–15)
BUN: 9 mg/dL (ref 6–20)
CO2: 31 mmol/L (ref 22–32)
Calcium: 6.8 mg/dL — ABNORMAL LOW (ref 8.9–10.3)
Chloride: 95 mmol/L — ABNORMAL LOW (ref 98–111)
Creatinine, Ser: 0.73 mg/dL (ref 0.44–1.00)
GFR calc Af Amer: >60 mL/min (ref >60)
GFR calc non Af Amer: >60 mL/min (ref >60)
Glucose, Bld: 119 mg/dL — ABNORMAL HIGH (ref 70–99)
Potassium: 3.2 mmol/L — ABNORMAL LOW (ref 3.5–5.1)
Sodium: 136 mmol/L (ref 135–145)
Total Bilirubin: 0.7 mg/dL (ref 0.3–1.2)
Total Protein: 6.3 g/dL — ABNORMAL LOW (ref 6.5–8.1)

## 2018-11-14 LAB — BPAM RBC
Blood Product Expiration Date: 202007152359
Blood Product Expiration Date: 202007152359
ISSUE DATE / TIME: 202006181228
ISSUE DATE / TIME: 202006181228
Unit Type and Rh: 5100
Unit Type and Rh: 5100

## 2018-11-14 LAB — TYPE AND SCREEN
ABO/RH(D): O POS
Antibody Screen: NEGATIVE
Unit division: 0
Unit division: 0

## 2018-11-14 LAB — CBC WITH DIFFERENTIAL/PLATELET
Abs Immature Granulocytes: 0.26 10*3/uL — ABNORMAL HIGH (ref 0.00–0.07)
Basophils Absolute: 0.1 10*3/uL (ref 0.0–0.1)
Basophils Relative: 1 %
Eosinophils Absolute: 0.1 10*3/uL (ref 0.0–0.5)
Eosinophils Relative: 1 %
HCT: 30.7 % — ABNORMAL LOW (ref 36.0–46.0)
Hemoglobin: 9.5 g/dL — ABNORMAL LOW (ref 12.0–15.0)
Immature Granulocytes: 3 %
Lymphocytes Relative: 25 %
Lymphs Abs: 1.9 10*3/uL (ref 0.7–4.0)
MCH: 30.9 pg (ref 26.0–34.0)
MCHC: 30.9 g/dL (ref 30.0–36.0)
MCV: 100 fL (ref 80.0–100.0)
Monocytes Absolute: 0.4 10*3/uL (ref 0.1–1.0)
Monocytes Relative: 5 %
Neutro Abs: 4.9 10*3/uL (ref 1.7–7.7)
Neutrophils Relative %: 65 %
Platelets: 131 10*3/uL — ABNORMAL LOW (ref 150–400)
RBC: 3.07 MIL/uL — ABNORMAL LOW (ref 3.87–5.11)
RDW: 17.4 % — ABNORMAL HIGH (ref 11.5–15.5)
WBC: 7.7 10*3/uL (ref 4.0–10.5)
nRBC: 0.8 % — ABNORMAL HIGH (ref 0.0–0.2)

## 2018-11-14 LAB — TSH: TSH: 4.251 u[IU]/mL (ref 0.350–4.500)

## 2018-11-14 LAB — PHOSPHORUS: Phosphorus: 2.5 mg/dL (ref 2.5–4.6)

## 2018-11-14 LAB — SARS CORONAVIRUS 2 BY RT PCR (HOSPITAL ORDER, PERFORMED IN ~~LOC~~ HOSPITAL LAB): SARS Coronavirus 2: NEGATIVE

## 2018-11-14 LAB — BRAIN NATRIURETIC PEPTIDE: B Natriuretic Peptide: 37.5 pg/mL (ref 0.0–100.0)

## 2018-11-14 LAB — MAGNESIUM: Magnesium: 2.4 mg/dL (ref 1.7–2.4)

## 2018-11-14 MED ORDER — ACETAMINOPHEN 650 MG RE SUPP: 650.0000 mg | Freq: Four times a day (QID) | RECTAL | Status: DC | PRN

## 2018-11-14 MED ORDER — ONDANSETRON HCL 4 MG/2ML IJ SOLN: 4.0000 mg | Freq: Four times a day (QID) | INTRAMUSCULAR | Status: DC | PRN

## 2018-11-14 MED ORDER — MORPHINE SULFATE (PF) 2 MG/ML IV SOLN
1.0000 mg | INTRAVENOUS | Status: DC | PRN
  Administered 2018-11-15 – 2018-11-19 (×3): 1 mg via INTRAVENOUS
  Filled 2018-11-14 (×3): qty 1

## 2018-11-14 MED ORDER — HEPARIN SODIUM (PORCINE) 5000 UNIT/ML IJ SOLN
5000.0000 [IU] | Freq: Three times a day (TID) | INTRAMUSCULAR | Status: DC
  Administered 2018-11-14 – 2018-11-20 (×17): 5000 [IU] via SUBCUTANEOUS
  Filled 2018-11-14 (×16): qty 1

## 2018-11-14 MED ORDER — FUROSEMIDE 10 MG/ML IJ SOLN
20.0000 mg | Freq: Once | INTRAMUSCULAR | Status: AC
  Administered 2018-11-14: 20 mg via INTRAVENOUS
  Filled 2018-11-14: qty 4

## 2018-11-14 MED ORDER — POLYETHYLENE GLYCOL 3350 17 G PO PACK: 17.0000 g | PACK | Freq: Every day | ORAL | Status: DC | PRN

## 2018-11-14 MED ORDER — ANASTROZOLE 1 MG PO TABS: 1.0000 mg | ORAL_TABLET | Freq: Every day | ORAL | Status: DC

## 2018-11-14 MED ORDER — ANASTROZOLE 1 MG PO TABS
1.0000 mg | ORAL_TABLET | Freq: Every day | ORAL | Status: DC
  Administered 2018-11-15 – 2018-12-01 (×17): 1 mg via ORAL
  Filled 2018-11-14 (×18): qty 1

## 2018-11-14 MED ORDER — CLOTRIMAZOLE 1 % EX CREA
TOPICAL_CREAM | Freq: Two times a day (BID) | CUTANEOUS | Status: DC | PRN
  Administered 2018-11-15: 1 via TOPICAL
  Filled 2018-11-14: qty 15

## 2018-11-14 MED ORDER — ACETAMINOPHEN 325 MG PO TABS
650.0000 mg | ORAL_TABLET | Freq: Four times a day (QID) | ORAL | Status: DC | PRN
  Administered 2018-11-14 – 2018-11-17 (×5): 650 mg via ORAL
  Filled 2018-11-14 (×5): qty 2

## 2018-11-14 MED ORDER — MAGNESIUM CITRATE PO SOLN
1.0000 | Freq: Once | ORAL | Status: AC | PRN
  Administered 2018-11-16: 1 via ORAL
  Filled 2018-11-14: qty 296

## 2018-11-14 MED ORDER — BISACODYL 5 MG PO TBEC: 5.0000 mg | DELAYED_RELEASE_TABLET | Freq: Every day | ORAL | Status: DC | PRN

## 2018-11-14 MED ORDER — OXYCODONE HCL 5 MG PO TABS
2.5000 mg | ORAL_TABLET | ORAL | Status: DC | PRN
  Administered 2018-11-15 – 2018-11-23 (×10): 2.5 mg via ORAL
  Filled 2018-11-14 (×10): qty 1

## 2018-11-14 MED ORDER — IPRATROPIUM-ALBUTEROL 0.5-2.5 (3) MG/3ML IN SOLN: 3.0000 mL | Freq: Four times a day (QID) | RESPIRATORY_TRACT | Status: DC | PRN

## 2018-11-14 MED ORDER — IPRATROPIUM-ALBUTEROL 0.5-2.5 (3) MG/3ML IN SOLN: 3.0000 mL | Freq: Four times a day (QID) | RESPIRATORY_TRACT | Status: DC

## 2018-11-14 MED ORDER — POTASSIUM CHLORIDE CRYS ER 20 MEQ PO TBCR
40.0000 meq | EXTENDED_RELEASE_TABLET | Freq: Once | ORAL | Status: AC
  Administered 2018-11-14: 40 meq via ORAL
  Filled 2018-11-14: qty 2

## 2018-11-14 MED ORDER — ONDANSETRON HCL 4 MG PO TABS: 4.0000 mg | ORAL_TABLET | Freq: Four times a day (QID) | ORAL | Status: DC | PRN

## 2018-11-14 MED ORDER — SENNOSIDES-DOCUSATE SODIUM 8.6-50 MG PO TABS: 1.0000 | ORAL_TABLET | Freq: Every evening | ORAL | Status: DC | PRN

## 2018-11-14 MED ORDER — PALBOCICLIB 125 MG PO CAPS: 125.0000 mg | ORAL_CAPSULE | Freq: Every day | ORAL | Status: DC

## 2018-11-14 NOTE — ED Notes (Signed)
Per Dr Magronot's office, patient was sent to ED for B/L pleural effusion

## 2018-11-14 NOTE — Progress Notes (Signed)
Admitting MD, Hugelmeyer, notified via text page of patients mews score of 4 once patient arrived to floor from ED.  Will continue to monitor patient.

## 2018-11-14 NOTE — ED Provider Notes (Signed)
[facility] DEPT Provider Note   CSN: [id number] Arrival date & time: 11/14/18  1408    History   Chief Complaint Chief Complaint  Patient presents with  . Cancer Patient  . Sent By PCP    HPI Suzanne [name] is a 58 y.o. female.     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  Transfused yesterday, shortness of breath last night worse.  Unable to sleep laying down flat. Sleeping on couch with a lot of pillows and blankets. Woke up very short of breath last night and then sits up and feels a little better. Coughing a lot at night. Today was supposed to have another covid19 test so on Monday could have fluid removed from lungs. Right lung was done 2 weeks ago and then had left lung, and now increased again. Some increased leg swelling. No fever.  Coughing up phlegm, sometimes dry, present for a few weeks, since hospitalized.   Past Medical History:  Diagnosis Date  . Cancer ([place])   . Gallstones   . Mastitis    reports history of recurrent mastitis  . Metastatic breast cancer ([place])   . Obesity     Patient Active Problem List   Diagnosis Date Noted  . Pleural effusion 11/14/2018  . Goals of care, counseling/discussion 11/06/2018  . Malignant neoplasm of overlapping sites of right breast in female, estrogen receptor positive ([place]) 10/31/2018  . Lung metastasis ([place]) 10/31/2018  . Pain from bone metastases ([place]) 10/31/2018  . Morbid obesity with BMI of 40.0-44.9, adult ([place]) 10/31/2018  . Bone injury   . Pleural effusion, bilateral   . Shortness of breath   . Abnormal breast finding   . Hypokalemia   . Breast skin changes   . Anemia   . Bone metastasis ([place])   . Acute anemia 10/26/2018    Past Surgical History:  Procedure  Laterality Date  . IR THORACENTESIS ASP PLEURAL SPACE W/IMG GUIDE  10/28/2018  . IR THORACENTESIS ASP PLEURAL SPACE W/IMG GUIDE  10/29/2018     OB History   No obstetric history on file.      Home Medications    Prior to Admission medications   Medication Sig Start Date End Date Taking? Authorizing Provider  anastrozole (ARIMIDEX) 1 MG tablet Take 1 tablet (1 mg total) by mouth daily. 10/31/18  Yes Meccariello, Bernita Raisin, DO  clotrimazole (LOTRIMIN) 1 % cream Apply topically 2 (two) times daily. For 2-4 weeks. 10/30/18  Yes Meccariello, Bernita Raisin, DO  furosemide (LASIX) 20 MG tablet Take 1 tablet (20 mg total) by mouth daily. 11/10/18  Yes [name], Virgie Dad, MD  oxyCODONE (OXY IR/ROXICODONE) 5 MG immediate release tablet Take 0.5 tablets (2.5 mg total) by mouth every 4 (four) hours as needed for moderate pain or severe pain. 11/06/18  Yes [name], Virgie Dad, MD  palbociclib [name]) 125 MG capsule Take 1 capsule (125 mg total) by mouth daily with breakfast. Take whole with food. Take for 21 days on, 7 days off, repeat every 28 days. 11/06/18  Yes [name], Virgie Dad, MD  polyethylene glycol (MIRALAX / GLYCOLAX) 17 g packet Take 17 g by mouth as needed for constipation.   Yes [provider]    Family History Family History  Problem Relation Age of Onset  . Breast  cancer Sister        in her 47's-70s  . Heart disease Brother        CABG  . Heart disease Brother        CABG  . Skin cancer Brother        melanoma  . Colon cancer Cousin   . Heart attack Mother   . Heart attack Father   . Prostate cancer Brother     Social History Social History   Tobacco Use  . Smoking status: Never Smoker  . Smokeless tobacco: Never Used  Substance Use Topics  . Alcohol use: No  . Drug use: No     Allergies   Patient has no known allergies.   Review of Systems Review of Systems  Constitutional: Negative for fever.  HENT: Negative for sore throat.   Eyes: Negative for visual  disturbance.  Respiratory: Positive for cough and shortness of breath.   Cardiovascular: Positive for leg swelling. Negative for chest pain.  Gastrointestinal: Negative for abdominal pain, nausea and vomiting.  Genitourinary: Negative for difficulty urinating.  Musculoskeletal: Negative for back pain and neck pain.  Skin: Negative for rash.  Neurological: Negative for syncope and headaches.     Physical Exam Updated Vital Signs BP (!) 143/89 (BP Location: Left Arm)   Pulse (!) 114   Temp 99.5 F (37.5 C) (Oral)   Resp (!) 32   Ht 5\' 2"  (1.575 m)   Wt 101.2 kg   LMP 05/28/2013 (Within Months)   SpO2 (!) 89%   BMI 40.81 kg/m   Physical Exam Vitals signs and nursing note reviewed.  Constitutional:      General: She is not in acute distress.    Appearance: She is well-developed. She is not diaphoretic.  HENT:     Head: Normocephalic and atraumatic.  Eyes:     Conjunctiva/sclera: Conjunctivae normal.  Neck:     Musculoskeletal: Normal range of motion.  Cardiovascular:     Rate and Rhythm: Normal rate and regular rhythm.     Heart sounds: Normal heart sounds.  Pulmonary:     Effort: Pulmonary effort is normal. No respiratory distress.     Breath sounds: Normal breath sounds.  Abdominal:     General: There is no distension.     Palpations: Abdomen is soft.     Tenderness: There is no abdominal tenderness. There is no guarding.  Musculoskeletal:        General: No tenderness.     Right lower leg: Edema present.     Left lower leg: Edema present.  Skin:    General: Skin is warm and dry.     Findings: No erythema or rash.  Neurological:     Mental Status: She is alert and oriented to person, place, and time.      ED Treatments / Results  Labs (all labs ordered are listed, but only abnormal results are displayed) Labs Reviewed  CBC WITH DIFFERENTIAL/PLATELET - Abnormal; Notable for the following components:      Result Value   RBC 3.07 (*)    Hemoglobin 9.5 (*)     HCT 30.7 (*)    RDW 17.4 (*)    Platelets 131 (*)    nRBC 0.8 (*)    Abs Immature Granulocytes 0.26 (*)    All other components within normal limits  COMPREHENSIVE METABOLIC PANEL - Abnormal; Notable for the following components:   Potassium 3.2 (*)    Chloride 95 (*)  Glucose, Bld 119 (*)    Calcium 6.8 (*)    Total Protein 6.3 (*)    Albumin 2.2 (*)    Alkaline Phosphatase 292 (*)    All other components within normal limits  SARS CORONAVIRUS 2 (HOSPITAL ORDER, [facility])  BRAIN NATRIURETIC PEPTIDE  MAGNESIUM  PHOSPHORUS  TSH  BASIC METABOLIC PANEL  CBC  PROTIME-INR    EKG EKG Interpretation  Date/Time:  Friday November 14 2018 16:31:40 EDT Ventricular Rate:  107 PR Interval:    QRS Duration: 82 QT Interval:  330 QTC Calculation: 441 R Axis:   38 Text Interpretation:  Sinus tachycardia Borderline T abnormalities, diffuse leads No significant change since last tracing Confirmed by Gareth Morgan [phone]) on 11/14/2018 4:33:45 PM   Radiology Dg Chest 2 View  Result Date: 11/14/2018 CLINICAL DATA:  Stage IV breast cancer.  Shortness of breath. EXAM: CHEST - 2 VIEW COMPARISON:  Radiographs 11/13/2018 and 10/29/2018.  CT 10/26/2018. FINDINGS: The heart size and mediastinal contours are stable. Left pleural effusion and left basilar opacity are unchanged from the recent study. Diffusely increased interstitial markings are again noted throughout both lungs, remaining suspicious for lymphangitic spread of tumor based on previous CT. No progressive airspace opacities identified. Diffuse osseous metastatic disease noted without evidence of acute fracture. IMPRESSION: Stable radiographic appearance of the chest since yesterday. A left pleural effusion and left basilar pulmonary opacity have increased from older prior studies. Evidence of extensive metastatic disease again noted. Electronically Signed   By: Richardean Sale M.D.   On: 11/14/2018 17:10   Dg  Chest 2 View  Result Date: 11/13/2018 CLINICAL DATA:  Shortness of breath, weakness. Cancer patient. EXAM: CHEST - 2 VIEW COMPARISON:  Chest x-ray dated 10/29/2018. Chest CT dated 10/26/2018. FINDINGS: Heart size and mediastinal contours appear stable. Increased opacity at the LEFT lung base, likely increased pleural effusion. There is also central pulmonary vascular congestion and bilateral perihilar edema. No pneumothorax seen. Diffuse osseous metastases, as described on previous CT report. IMPRESSION: 1. Increased opacity at the LEFT lung base, likely increased pleural effusion. Superimposed pneumonia or airspace collapse not excluded. 2. Central pulmonary vascular congestion and bilateral perihilar edema indicating CHF/volume overload. 3. Diffuse osseous metastases. Electronically Signed   By: Franki Cabot M.D.   On: 11/13/2018 12:32   Dg Abd 2 Views  Result Date: 11/13/2018 CLINICAL DATA:  Pt c/o sob, weakness, cancer patient. EXAM: ABDOMEN - 2 VIEW COMPARISON:  None. FINDINGS: Overall visualized bowel gas pattern is nonobstructive. Moderate amount of stool and gas throughout the colon. No evidence of free intraperitoneal air seen. Diffuse osseous metastases, as previously described. IMPRESSION: 1. Nonobstructive bowel gas pattern. Moderate amount of stool and gas throughout the colon. 2. Diffuse osseous metastases. Electronically Signed   By: Franki Cabot M.D.   On: 11/13/2018 12:33    Procedures Procedures (including critical care time)  Medications Ordered in ED Medications  oxyCODONE (Oxy IR/ROXICODONE) immediate release tablet 2.5 mg (has no administration in time range)  palbociclib (IBRANCE) capsule 125 mg (has no administration in time range)  polyethylene glycol (MIRALAX / GLYCOLAX) packet 17 g (has no administration in time range)  clotrimazole (LOTRIMIN) 1 % cream (has no administration in time range)  acetaminophen (TYLENOL) tablet 650 mg (650 mg Oral Given 11/14/18 2256)    Or   acetaminophen (TYLENOL) suppository 650 mg ( Rectal See Alternative 11/14/18 2256)  senna-docusate (Senokot-S) tablet 1 tablet (has no administration in time range)  bisacodyl (DULCOLAX) EC tablet 5 mg (has no administration in time range)  magnesium citrate solution 1 Bottle (has no administration in time range)  ondansetron (ZOFRAN) tablet 4 mg (has no administration in time range)    Or  ondansetron (ZOFRAN) injection 4 mg (has no administration in time range)  heparin injection 5,000 Units (5,000 Units Subcutaneous Given 11/14/18 2256)  morphine 2 MG/ML injection 1 mg (has no administration in time range)  anastrozole (ARIMIDEX) tablet 1 mg (has no administration in time range)  ipratropium-albuterol (DUONEB) 0.5-2.5 (3) MG/3ML nebulizer solution 3 mL (has no administration in time range)  furosemide (LASIX) injection 20 mg (20 mg Intravenous Given 11/14/18 2000)  potassium chloride SA (K-DUR) CR tablet 40 mEq (40 mEq Oral Given 11/14/18 2256)     Initial Impression / Assessment and Plan / ED Course  I have reviewed the triage vital signs and the nursing notes.  Pertinent labs & imaging results that were available during my care of the patient were reviewed by me and considered in my medical decision making (see chart for details).        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  X-ray shows left-sided pleural effusion and left basilar pulmonary opacity, with diffusely increased interstitial markings which may be edema or lymphangitic spread.  Labs show normal BNP.  She had a CT with contrast done at time of her diagnosis, and given multiple other reasons for shortness of breath and significant positional nature, suspect her dyspnea is most likely secondary to pleural effusion and  possible fluid overload, and less likely to represent a pulmonary embolus.  While she is not hypoxic at this time on room air, she is significantly symptomatic, will admit for observation, diuresis and thoracentesis.  Final Clinical Impressions(s) / ED Diagnoses   Final diagnoses:  Shortness of breath  Pleural effusion    ED Discharge Orders    None       Gareth Morgan, MD 11/14/18 2306

## 2018-11-14 NOTE — Telephone Encounter (Signed)
Oral Chemotherapy Pharmacist Encounter   Received notification from patient advocate Suzanne [name] that Suzanne [name]'s daughter Suzanne [name] called and wanted to discuss OTC interactions.  I returned a call to Suzanne [name] who stated that her mother was taking Miralax as needed and using Biotene. She wanted to make sure both were okay with Ibrance. I assure her that neither interacted with Ibrance. She stated her understanding. I added both to her medication list.   Suzanne [name], PharmD, BCPS, [facility] Hematology/Oncology Clinical Pharmacist [facility] [phone]  11/14/2018 10:26 AM

## 2018-11-14 NOTE — ED Notes (Signed)
Bed: WA08 Expected date:  Expected time:  Means of arrival:  Comments: EMS-cancer patient/LE swelling

## 2018-11-14 NOTE — Progress Notes (Signed)
[facility] CSW Progress Notes  [facility] referral sent w verbal consent.  Daughter advised that she can check with [facility] on Monday to see if her application has been processed.  Daughter wonders if [facility] has ordered hospital bed as she was told while inpatient.  Daughter concerned that patient may need O2 at night. Thinks hospital bed will help.  Daughter anticipating PT visit this morning and will discuss needs w [facility] PT.    [name], LCSW Clinical Social Worker Phone:  [phone]

## 2018-11-14 NOTE — Telephone Encounter (Signed)
This RN was able to speak with PT- Samyra of [facility] in pt's home at 1220 pm - she notes increased swelling in bilateral lower extremities and noted dyspnea.  PT states concern relating to need to go today for COVID testing in current condition.  This RN discussed status and likely need for pt to proceed to the ER by EMS for evaluation and hopefully obtain thorancentesis this week end for symptom management.  Note pt has multiple needs for DME- but due to no payor source has not been able to obtain a hospital bed or home oxygen that would benefit pt's current status.  [facility] SW is contacting pt's home health nurse navigator [name] for coordination of possible resources.  Samyra PT will inform daughter to call EMS for transport to ER for current medical concerns.  No further needs at this time per this call.

## 2018-11-14 NOTE — Progress Notes (Addendum)
Received call from [facility], pt is active for [facility]. Will not be able to arrange home oxygen from ED setting as pt is a charity. If oxygen is needed, will send referral to [facility] for [place] RN CCM Case Mgmt phone [phone]

## 2018-11-14 NOTE — ED Notes (Signed)
ED TO INPATIENT HANDOFF REPORT  Name/Age/Gender Suzanne [name] 58 y.o. female  Code Status Code Status History    Date Active Date Inactive Code Status Order ID Comments User Context   10/26/2018 2342 10/30/2018 2112 Full Code [id number]  Danna Hefty, DO ED   Advance Care Planning Activity      Home/SNF/Other Home  Chief Complaint sent by doctor  Level of Care/Admitting Diagnosis ED Disposition    ED Disposition Condition [place]: [facility] [[id number]]  Level of Care: Telemetry [5]  Admit to tele based on following criteria: Acute CHF  Covid Evaluation: Confirmed COVID Negative  Diagnosis: Pleural effusion [628366]  Admitting Physician: Harvie Bridge [[id number]]  Attending Physician: Sherron Monday  Estimated length of stay: past midnight tomorrow  Certification:: I certify this patient will need inpatient services for at least 2 midnights  PT Class (Do Not Modify): Inpatient [101]  PT Acc Code (Do Not Modify): Private [1]       Medical History Past Medical History:  Diagnosis Date  . Cancer ([place])   . Gallstones   . Mastitis    reports history of recurrent mastitis  . Metastatic breast cancer ([place])   . Obesity     Allergies No Known Allergies  IV Location/Drains/Wounds Patient Lines/Drains/Airways Status   Active Line/Drains/Airways    Name:   Placement date:   Placement time:   Site:   Days:   External Urinary Catheter   11/14/18    1807    -   less than 1          Labs/Imaging Results for orders placed or performed during the hospital encounter of 11/14/18 (from the past 48 hour(s))  CBC with Differential     Status: Abnormal   Collection Time: 11/14/18  4:20 PM  Result Value Ref Range   WBC 7.7 4.0 - 10.5 K/uL   RBC 3.07 (L) 3.87 - 5.11 MIL/uL   Hemoglobin 9.5 (L) 12.0 - 15.0 g/dL   HCT 30.7 (L) 36.0 - 46.0 %   MCV 100.0 80.0 - 100.0 fL   MCH 30.9 26.0 - 34.0 pg   MCHC 30.9 30.0  - 36.0 g/dL   RDW 17.4 (H) 11.5 - 15.5 %   Platelets 131 (L) 150 - 400 K/uL   nRBC 0.8 (H) 0.0 - 0.2 %   Neutrophils Relative % 65 %   Neutro Abs 4.9 1.7 - 7.7 K/uL   Lymphocytes Relative 25 %   Lymphs Abs 1.9 0.7 - 4.0 K/uL   Monocytes Relative 5 %   Monocytes Absolute 0.4 0.1 - 1.0 K/uL   Eosinophils Relative 1 %   Eosinophils Absolute 0.1 0.0 - 0.5 K/uL   Basophils Relative 1 %   Basophils Absolute 0.1 0.0 - 0.1 K/uL   Immature Granulocytes 3 %   Abs Immature Granulocytes 0.26 (H) 0.00 - 0.07 K/uL    Comment: Performed at [facility], [place] [address], [place], [place] [address]  Comprehensive metabolic panel     Status: Abnormal   Collection Time: 11/14/18  4:20 PM  Result Value Ref Range   Sodium 136 135 - 145 mmol/L   Potassium 3.2 (L) 3.5 - 5.1 mmol/L   Chloride 95 (L) 98 - 111 mmol/L   CO2 31 22 - 32 mmol/L   Glucose, Bld 119 (H) 70 - 99 mg/dL   BUN 9 6 - 20 mg/dL   Creatinine, Ser 0.73 0.44 -  1.00 mg/dL   Calcium 6.8 (L) 8.9 - 10.3 mg/dL   Total Protein 6.3 (L) 6.5 - 8.1 g/dL   Albumin 2.2 (L) 3.5 - 5.0 g/dL   AST 26 15 - 41 U/L   ALT 17 0 - 44 U/L   Alkaline Phosphatase 292 (H) 38 - 126 U/L   Total Bilirubin 0.7 0.3 - 1.2 mg/dL   GFR calc non Af Amer >60 >60 mL/min   GFR calc Af Amer >60 >60 mL/min   Anion gap 10 5 - 15    Comment: Performed at [facility], [place] [address], [place], [place] [address]  Brain natriuretic peptide     Status: None   Collection Time: 11/14/18  4:21 PM  Result Value Ref Range   B Natriuretic Peptide 37.5 0.0 - 100.0 pg/mL    Comment: Performed at [facility], [place] [address], [place], [place] [address]  SARS Coronavirus 2 (CEPHEID - Performed in [place] hospital lab), Hosp Order     Status: None   Collection Time: 11/14/18  4:34 PM   Specimen: Nasopharyngeal Swab  Result Value Ref Range   SARS Coronavirus 2 NEGATIVE NEGATIVE    Comment: (NOTE) If result is  NEGATIVE SARS-CoV-2 target nucleic acids are NOT DETECTED. The SARS-CoV-2 RNA is generally detectable in upper and lower  respiratory specimens during the acute phase of infection. The lowest  concentration of SARS-CoV-2 viral copies this assay can detect is 250  copies / mL. A negative result does not preclude SARS-CoV-2 infection  and should not be used as the sole basis for treatment or other  patient management decisions.  A negative result may occur with  improper specimen collection / handling, submission of specimen other  than nasopharyngeal swab, presence of viral mutation(s) within the  areas targeted by this assay, and inadequate number of viral copies  (<250 copies / mL). A negative result must be combined with clinical  observations, patient history, and epidemiological information. If result is POSITIVE SARS-CoV-2 target nucleic acids are DETECTED. The SARS-CoV-2 RNA is generally detectable in upper and lower  respiratory specimens dur ing the acute phase of infection.  Positive  results are indicative of active infection with SARS-CoV-2.  Clinical  correlation with patient history and other diagnostic information is  necessary to determine patient infection status.  Positive results do  not rule out bacterial infection or co-infection with other viruses. If result is PRESUMPTIVE POSTIVE SARS-CoV-2 nucleic acids MAY BE PRESENT.   A presumptive positive result was obtained on the submitted specimen  and confirmed on repeat testing.  While 2019 novel coronavirus  (SARS-CoV-2) nucleic acids may be present in the submitted sample  additional confirmatory testing may be necessary for epidemiological  and / or clinical management purposes  to differentiate between  SARS-CoV-2 and other Sarbecovirus currently known to infect humans.  If clinically indicated additional testing with an alternate test  methodology [phone]) is advised. The SARS-CoV-2 RNA is generally  detectable  in upper and lower respiratory sp ecimens during the acute  phase of infection. The expected result is Negative. Fact Sheet for Patients:  StrictlyIdeas.no Fact Sheet for Healthcare Providers: BankingDealers.co.za This test is not yet approved or cleared by the [place] FDA and has been authorized for detection and/or diagnosis of SARS-CoV-2 by FDA under an Emergency Use Authorization (EUA).  This EUA will remain in effect (meaning this test can be used) for the duration of the COVID-19 declaration under Section  564(b)(1) of the Act, 21 U.S.C. section 360bbb-3(b)(1), unless the authorization is terminated or revoked sooner. Performed at [facility], [place] [address], [place], [place] [address]    Dg Chest 2 View  Result Date: 11/14/2018 CLINICAL DATA:  Stage IV breast cancer.  Shortness of breath. EXAM: CHEST - 2 VIEW COMPARISON:  Radiographs 11/13/2018 and 10/29/2018.  CT 10/26/2018. FINDINGS: The heart size and mediastinal contours are stable. Left pleural effusion and left basilar opacity are unchanged from the recent study. Diffusely increased interstitial markings are again noted throughout both lungs, remaining suspicious for lymphangitic spread of tumor based on previous CT. No progressive airspace opacities identified. Diffuse osseous metastatic disease noted without evidence of acute fracture. IMPRESSION: Stable radiographic appearance of the chest since yesterday. A left pleural effusion and left basilar pulmonary opacity have increased from older prior studies. Evidence of extensive metastatic disease again noted. Electronically Signed   By: Richardean Sale M.D.   On: 11/14/2018 17:10   Dg Chest 2 View  Result Date: 11/13/2018 CLINICAL DATA:  Shortness of breath, weakness. Cancer patient. EXAM: CHEST - 2 VIEW COMPARISON:  Chest x-ray dated 10/29/2018. Chest CT dated 10/26/2018. FINDINGS: Heart size and mediastinal  contours appear stable. Increased opacity at the LEFT lung base, likely increased pleural effusion. There is also central pulmonary vascular congestion and bilateral perihilar edema. No pneumothorax seen. Diffuse osseous metastases, as described on previous CT report. IMPRESSION: 1. Increased opacity at the LEFT lung base, likely increased pleural effusion. Superimposed pneumonia or airspace collapse not excluded. 2. Central pulmonary vascular congestion and bilateral perihilar edema indicating CHF/volume overload. 3. Diffuse osseous metastases. Electronically Signed   By: Franki Cabot M.D.   On: 11/13/2018 12:32   Dg Abd 2 Views  Result Date: 11/13/2018 CLINICAL DATA:  Pt c/o sob, weakness, cancer patient. EXAM: ABDOMEN - 2 VIEW COMPARISON:  None. FINDINGS: Overall visualized bowel gas pattern is nonobstructive. Moderate amount of stool and gas throughout the colon. No evidence of free intraperitoneal air seen. Diffuse osseous metastases, as previously described. IMPRESSION: 1. Nonobstructive bowel gas pattern. Moderate amount of stool and gas throughout the colon. 2. Diffuse osseous metastases. Electronically Signed   By: Franki Cabot M.D.   On: 11/13/2018 12:33    Pending Labs [employer] (From admission, onward)    Start     Ordered   Signed and Held  Magnesium  Add-on,   R     Signed and Held   Signed and Held  Phosphorus  Add-on,   R     Signed and Held   Signed and Occupational hygienist morning,   R     Signed and Held   Signed and Held  CBC  Tomorrow morning,   R     Signed and Held   Signed and Held  CBC  (heparin)  Once,   R    Comments: Baseline for heparin therapy IF NOT ALREADY DRAWN.  Notify MD if PLT < 100 K.    Signed and Held   Signed and Held  Creatinine, serum  (heparin)  Once,   R    Comments: Baseline for heparin therapy IF NOT ALREADY DRAWN.    Signed and Held   Signed and Held  TSH  Once,   R     Signed and Held   Signed and Held  Protime-INR   Tomorrow morning,   R     Signed and Held  Vitals/Pain Today's Vitals   11/14/18 1634 11/14/18 1800 11/14/18 1830 11/14/18 1930  BP: 126/86 (!) 139/57 115/72 104/82  Pulse: (!) 107 (!) 107 (!) 104 (!) 106  Resp: (!) 27 (!) 36 (!) 33 (!) 32  Temp:      TempSrc:      SpO2: 95% 93% 94% 94%  Weight:      Height:      PainSc:        Isolation Precautions No active isolations  Medications Medications  furosemide (LASIX) injection 20 mg (20 mg Intravenous Given 11/14/18 2000)    Mobility walks with device

## 2018-11-14 NOTE — Progress Notes (Signed)
[facility] CSW Progress Notes   Call to Woody Seller, nurse navigator w [facility] [phone]), left VM requesting call back to discuss patient's needs.  [name], LCSW Clinical Social Worker Phone:  [phone]

## 2018-11-14 NOTE — H&P (Signed)
History and Physical   [facility] [place] [facility] [place] Admission History and Physical [employer], D.O.    Patient Name: Suzanne [name] MR#: [id number] Date of Birth: Sep 27, 1960 Date of Admission: 11/14/2018  Referring MD/NP/PA: Dr. Billy Fischer Primary Care Physician: Patient, No Pcp Per  Chief Complaint:  Chief Complaint  Patient presents with  . Cancer Patient  . Sent By PCP    HPI: Suzanne [name] is a 58 y.o. 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  Patient underwent blood transfusion yesterday and subsequently had SOB, DOE and orthopnea assoicated with cough.  She has a thoracentesis of right then left lungs on 6/2 and 6/3 and was scheduled for a repeat 3 days   Patient denies fevers/chills, weakness, dizziness, chest pain, N/V/C/D, abdominal pain, dysuria/frequency, changes in mental status.    EMS/ED Course:  Medical admission has been requested for further management of recurrent pleural effusion.  Review of Systems:  CONSTITUTIONAL: No fever/chills, fatigue, weakness, weight gain/loss, headache. EYES: No blurry or double vision. ENT: No tinnitus, postnasal drip, redness or soreness of the oropharynx. RESPIRATORY: Positive cough, dyspnea, orthopnea.  CARDIOVASCULAR: No chest pain, palpitations, syncope, orthopnea. Positive lower extremity edema.  GASTROINTESTINAL: No nausea, vomiting, abdominal pain, diarrhea, constipation.  No hematemesis, melena or hematochezia. GENITOURINARY: No dysuria, frequency, hematuria. ENDOCRINE: No polyuria or nocturia. No heat or cold intolerance. HEMATOLOGY: No anemia, bruising, bleeding. INTEGUMENTARY: No rashes, ulcers, lesions. MUSCULOSKELETAL: No arthritis, gout. NEUROLOGIC: No numbness, tingling, ataxia, seizure-type activity, weakness. PSYCHIATRIC:  No anxiety, depression, insomnia.   Past Medical History:  Diagnosis Date  . Gallstones   . Mastitis    reports history of recurrent mastitis  . Obesity     Past Surgical History:  Procedure Laterality Date  . IR THORACENTESIS ASP PLEURAL SPACE W/IMG GUIDE  10/28/2018  . IR THORACENTESIS ASP PLEURAL SPACE W/IMG GUIDE  10/29/2018     reports that she has never smoked. She has never used smokeless tobacco. She reports that she does not drink alcohol or use drugs.  No Known Allergies  Family History  Problem Relation Age of Onset  . Breast cancer Sister        in her 79's-70s  . Heart disease Brother        CABG  . Heart disease Brother        CABG  . Skin cancer Brother        melanoma  . Colon cancer Cousin   . Heart attack Mother   . Heart attack Father   . Prostate cancer Brother     Prior to Admission medications   Medication Sig Start Date End Date Taking? Authorizing Provider  anastrozole (ARIMIDEX) 1 MG tablet Take 1 tablet (1 mg total) by mouth daily. 10/31/18  Yes Meccariello, Bernita Raisin, DO  clotrimazole (LOTRIMIN) 1 % cream Apply topically 2 (two) times daily. For 2-4 weeks. 10/30/18  Yes Meccariello, Bernita Raisin, DO  furosemide (LASIX) 20 MG tablet Take 1 tablet (20 mg total) by mouth daily. 11/10/18  Yes [name], Virgie Dad, MD  oxyCODONE (OXY IR/ROXICODONE) 5 MG immediate release tablet Take 0.5 tablets (2.5 mg total) by mouth every 4 (four) hours as needed for moderate pain or severe pain. 11/06/18  Yes [name], Virgie Dad, MD  palbociclib [name]) 125 MG capsule Take 1 capsule (125 mg total) by mouth  daily with breakfast. Take whole with food. Take for 21 days on, 7 days off, repeat every 28 days. 11/06/18  Yes [name], Virgie Dad, MD  polyethylene glycol (MIRALAX / GLYCOLAX) 17 g packet Take 17 g by mouth as needed for constipation.   Yes [provider]    Physical Exam: Vitals:   11/14/18 1500 11/14/18 1600 11/14/18 1634 11/14/18 1800  BP: (!) 145/81 (!)  142/85 126/86 (!) 139/57  Pulse: (!) 104 99 (!) 107 (!) 107  Resp: 20  (!) 27 (!) 36  Temp:      TempSrc:      SpO2: 96% 93% 95% 93%  Weight:      Height:        GENERAL: 58 y.o.-year-old female patient, sitting up in the bed in no acute distress.  Pleasant and cooperative.  Dyspneic with minimal exertion.  HEENT: Head atraumatic, normocephalic. Pupils equal. Mucus membranes moist. NECK: Supple. No JVD. CHEST: Diminished breath sounds.  Difficult exam 2/2 body habitus and poor inspiratory effort.  CARDIOVASCULAR: S1, S2 normal. No murmurs, rubs, or gallops. Cap refill <2 seconds. Pulses intact distally.  ABDOMEN: Soft, nondistended, nontender. No rebound, guarding, rigidity. Normoactive bowel sounds present in all four quadrants.  EXTREMITIES: No pedal edema, cyanosis, or clubbing. No calf tenderness or Homan's sign.  NEUROLOGIC: The patient is alert and oriented x 3. Cranial nerves II through XII are grossly intact with no focal sensorimotor deficit. PSYCHIATRIC:  Normal affect, mood, thought content. SKIN: Warm, dry, and intact without obvious rash, lesion, or ulcer.    Labs on Admission:  CBC: Recent Labs  Lab 11/13/18 0941 11/14/18 1620  WBC 9.3 7.7  NEUTROABS 6.0 4.9  HGB 6.6* 9.5*  HCT 20.8* 30.7*  MCV 99.0 100.0  PLT 145* 737*   Basic Metabolic Panel: Recent Labs  Lab 11/13/18 0941 11/14/18 1620  NA 133* 136  K 3.3* 3.2*  CL 93* 95*  CO2 31 31  GLUCOSE 135* 119*  BUN 8 9  CREATININE 0.71 0.73  CALCIUM 7.6* 6.8*   GFR: Estimated Creatinine Clearance: 88.2 mL/min (by C-G formula based on SCr of 0.73 mg/dL). Liver Function Tests: Recent Labs  Lab 11/13/18 0941 11/14/18 1620  AST 23 26  ALT 14 17  ALKPHOS 329* 292*  BILITOT 0.8 0.7  PROT 6.3* 6.3*  ALBUMIN 1.9* 2.2*   No results for input(s): LIPASE, AMYLASE in the last 168 hours. No results for input(s): AMMONIA in the last 168 hours. Coagulation Profile: No results for input(s): INR, PROTIME in  the last 168 hours. Cardiac Enzymes: No results for input(s): CKTOTAL, CKMB, CKMBINDEX, TROPONINI in the last 168 hours. BNP (last 3 results) No results for input(s): PROBNP in the last 8760 hours. HbA1C: No results for input(s): HGBA1C in the last 72 hours. CBG: No results for input(s): GLUCAP in the last 168 hours. Lipid Profile: No results for input(s): CHOL, HDL, LDLCALC, TRIG, CHOLHDL, LDLDIRECT in the last 72 hours. Thyroid Function Tests: No results for input(s): TSH, T4TOTAL, FREET4, T3FREE, THYROIDAB in the last 72 hours. Anemia Panel: No results for input(s): VITAMINB12, FOLATE, FERRITIN, TIBC, IRON, RETICCTPCT in the last 72 hours. Urine analysis:    Component Value Date/Time   COLORURINE YELLOW 10/27/2018 2346   APPEARANCEUR HAZY (A) 10/27/2018 2346   LABSPEC 1.038 (H) 10/27/2018 2346   PHURINE 5.0 10/27/2018 2346   GLUCOSEU NEGATIVE 10/27/2018 2346   HGBUR NEGATIVE 10/27/2018 2346   BILIRUBINUR NEGATIVE 10/27/2018 2346   KETONESUR 5 (A) 10/27/2018  [place] 10/27/2018 2346   NITRITE NEGATIVE 10/27/2018 2346   LEUKOCYTESUR NEGATIVE 10/27/2018 2346   Sepsis Labs: @LABRCNTIP (procalcitonin:4,lacticidven:4) ) Recent Results (from the past 240 hour(s))  SARS Coronavirus 2 (CEPHEID - Performed in [place] hospital lab), Hosp Order     Status: None   Collection Time: 11/14/18  4:34 PM   Specimen: Nasopharyngeal Swab  Result Value Ref Range Status   SARS Coronavirus 2 NEGATIVE NEGATIVE Final    Comment: (NOTE) If result is NEGATIVE SARS-CoV-2 target nucleic acids are NOT DETECTED. The SARS-CoV-2 RNA is generally detectable in upper and lower  respiratory specimens during the acute phase of infection. The lowest  concentration of SARS-CoV-2 viral copies this assay can detect is 250  copies / mL. A negative result does not preclude SARS-CoV-2 infection  and should not be used as the sole basis for treatment or other  patient management decisions.  A  negative result may occur with  improper specimen collection / handling, submission of specimen other  than nasopharyngeal swab, presence of viral mutation(s) within the  areas targeted by this assay, and inadequate number of viral copies  (<250 copies / mL). A negative result must be combined with clinical  observations, patient history, and epidemiological information. If result is POSITIVE SARS-CoV-2 target nucleic acids are DETECTED. The SARS-CoV-2 RNA is generally detectable in upper and lower  respiratory specimens dur ing the acute phase of infection.  Positive  results are indicative of active infection with SARS-CoV-2.  Clinical  correlation with patient history and other diagnostic information is  necessary to determine patient infection status.  Positive results do  not rule out bacterial infection or co-infection with other viruses. If result is PRESUMPTIVE POSTIVE SARS-CoV-2 nucleic acids MAY BE PRESENT.   A presumptive positive result was obtained on the submitted specimen  and confirmed on repeat testing.  While 2019 novel coronavirus  (SARS-CoV-2) nucleic acids may be present in the submitted sample  additional confirmatory testing may be necessary for epidemiological  and / or clinical management purposes  to differentiate between  SARS-CoV-2 and other Sarbecovirus currently known to infect humans.  If clinically indicated additional testing with an alternate test  methodology [phone]) is advised. The SARS-CoV-2 RNA is generally  detectable in upper and lower respiratory sp ecimens during the acute  phase of infection. The expected result is Negative. Fact Sheet for Patients:  StrictlyIdeas.no Fact Sheet for Healthcare Providers: BankingDealers.co.za This test is not yet approved or cleared by the [place] FDA and has been authorized for detection and/or diagnosis of SARS-CoV-2 by FDA under an Emergency Use  Authorization (EUA).  This EUA will remain in effect (meaning this test can be used) for the duration of the COVID-19 declaration under Section 564(b)(1) of the Act, 21 U.S.C. section 360bbb-3(b)(1), unless the authorization is terminated or revoked sooner. Performed at [facility], [place] [address], [place], [place] [address]      Radiological Exams on Admission: Dg Chest 2 View  Result Date: 11/14/2018 CLINICAL DATA:  Stage IV breast cancer.  Shortness of breath. EXAM: CHEST - 2 VIEW COMPARISON:  Radiographs 11/13/2018 and 10/29/2018.  CT 10/26/2018. FINDINGS: The heart size and mediastinal contours are stable. Left pleural effusion and left basilar opacity are unchanged from the recent study. Diffusely increased interstitial markings are again noted throughout both lungs, remaining suspicious for lymphangitic spread of tumor based on previous CT. No progressive airspace opacities identified. Diffuse osseous metastatic disease noted without  evidence of acute fracture. IMPRESSION: Stable radiographic appearance of the chest since yesterday. A left pleural effusion and left basilar pulmonary opacity have increased from older prior studies. Evidence of extensive metastatic disease again noted. Electronically Signed   By: Richardean Sale M.D.   On: 11/14/2018 17:10   Dg Chest 2 View  Result Date: 11/13/2018 CLINICAL DATA:  Shortness of breath, weakness. Cancer patient. EXAM: CHEST - 2 VIEW COMPARISON:  Chest x-ray dated 10/29/2018. Chest CT dated 10/26/2018. FINDINGS: Heart size and mediastinal contours appear stable. Increased opacity at the LEFT lung base, likely increased pleural effusion. There is also central pulmonary vascular congestion and bilateral perihilar edema. No pneumothorax seen. Diffuse osseous metastases, as described on previous CT report. IMPRESSION: 1. Increased opacity at the LEFT lung base, likely increased pleural effusion. Superimposed pneumonia or airspace  collapse not excluded. 2. Central pulmonary vascular congestion and bilateral perihilar edema indicating CHF/volume overload. 3. Diffuse osseous metastases. Electronically Signed   By: Franki Cabot M.D.   On: 11/13/2018 12:32   Dg Abd 2 Views  Result Date: 11/13/2018 CLINICAL DATA:  Pt c/o sob, weakness, cancer patient. EXAM: ABDOMEN - 2 VIEW COMPARISON:  None. FINDINGS: Overall visualized bowel gas pattern is nonobstructive. Moderate amount of stool and gas throughout the colon. No evidence of free intraperitoneal air seen. Diffuse osseous metastases, as previously described. IMPRESSION: 1. Nonobstructive bowel gas pattern. Moderate amount of stool and gas throughout the colon. 2. Diffuse osseous metastases. Electronically Signed   By: Franki Cabot M.D.   On: 11/13/2018 12:33    EKG: Sinus tach at 107 bpm with normal axis and nonspecific ST-T wave changes.   Assessment/Plan  This is a 58 y.o. 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 Recurrent L pleural effusion - Admit inpatient - IR for thoracentesis - Check INR daily  #. New onset CHF - Monitor on tele - IV Lasix - I/O - Check echo  #. Mild hypokalemia  - Replace orally  #. Anemia, chronic and stable - Trend CBC  #. History of metastatic inflammatory breast cancer - Continue Ibrance   Admission status: Inpatient, tele IV Fluids: HL Diet/Nutrition: Heart healthy Consults called: IR  DVT Px: Heparin SCDs and early ambulation. Code Status: Full Code  Disposition Plan: To home in 1-2 days  All the records are reviewed and case discussed with ED provider. Management plans discussed with the patient and/or family who express understanding and agree with plan of care.  Kyiesha Millward D.O. on 11/14/2018 at 7:22 PM CC: Primary care physician; Patient, No Pcp Per   11/14/2018, 7:22 PM

## 2018-11-14 NOTE — Progress Notes (Signed)
[place] CSW Progress Notes  Call from Elwyn Lade [facility] CSW working w patient for home health needs - [phone]).  Per [facility] CSW, agency has been trying to get O2 for patient from [facility] (current charity DME provider).  [facility] will need 2 weeks to process application for charity care for home O2 and hospital bed.  Will need MD orders.  CSW Alwyn Ren will determine if [employer] is willing to pursue these needs, inform CSW of their decision and provide contact information for DME agency.  [place] will communicate w [facility] leadership Nathaniel Man) to determine how best to meet these needs until [employer] can have items in place via charity care.  Also, per Rolena Infante, patient will need to have applied for both [place]icaid and disability in order to be considered for possible TOC LOG, Velora Heckler, [place] assigned to case, has tried to speak w patient today in order to continue to work on her case however pt was unable to speak due to her current pain and medical condition.  Pt is reportedly on way to ED for further care upon recommendation of home health personnel in the home.  CSW will await further information from [place].  [name], LCSW Clinical Social Worker Phone:  [phone]

## 2018-11-14 NOTE — ED Triage Notes (Signed)
Patient is AOx4 and ambulatory with some minor assistance getting up. Patient was instructed to call 911 and go to ED by PCP however patient does not know reason why. Family stated they were prescribed home oxygen however have no way to attaining oxygen due to insurance. Patient is stage 4 breast cancer patient that was seen at facility [facility] yesterday.

## 2018-11-14 NOTE — Telephone Encounter (Signed)
Oral Oncology Patient Advocate Encounter  Received notification from [place] Patient Assistance program that patient has been successfully enrolled into their program to receive Ibrance from the manufacturer at $0 out of pocket until 11/14/19.    I called and spoke with patients daughter, [name].  She knows we will have to re-apply.   Patient knows to call the office with questions or concerns.   Oral Oncology Clinic will continue to follow.  [place] [facility] [place] Phone [phone] Fax [phone] 11/14/2018    1:46 PM

## 2018-11-14 NOTE — Progress Notes (Signed)
Chart reviewed as requested by Zack Asst Director of SW; [place] Supervisor [phone]

## 2018-11-15 ENCOUNTER — Inpatient Hospital Stay (HOSPITAL_COMMUNITY): Payer: Medicaid Other

## 2018-11-15 ENCOUNTER — Other Ambulatory Visit (HOSPITAL_COMMUNITY): Payer: Self-pay

## 2018-11-15 DIAGNOSIS — E8771 Transfusion associated circulatory overload: Secondary | ICD-10-CM

## 2018-11-15 DIAGNOSIS — J9601 Acute respiratory failure with hypoxia: Secondary | ICD-10-CM

## 2018-11-15 LAB — BASIC METABOLIC PANEL
Anion gap: 9 (ref 5–15)
BUN: 8 mg/dL (ref 6–20)
CO2: 30 mmol/L (ref 22–32)
Calcium: 6.4 mg/dL — CL (ref 8.9–10.3)
Chloride: 96 mmol/L — ABNORMAL LOW (ref 98–111)
Creatinine, Ser: 0.65 mg/dL (ref 0.44–1.00)
GFR calc Af Amer: >60 mL/min (ref >60)
GFR calc non Af Amer: >60 mL/min (ref >60)
Glucose, Bld: 155 mg/dL — ABNORMAL HIGH (ref 70–99)
Potassium: 3.5 mmol/L (ref 3.5–5.1)
Sodium: 135 mmol/L (ref 135–145)

## 2018-11-15 LAB — CBC
HCT: 31.7 % — ABNORMAL LOW (ref 36.0–46.0)
Hemoglobin: 9.9 g/dL — ABNORMAL LOW (ref 12.0–15.0)
MCH: 31.5 pg (ref 26.0–34.0)
MCHC: 31.2 g/dL (ref 30.0–36.0)
MCV: 101 fL — ABNORMAL HIGH (ref 80.0–100.0)
Platelets: 135 10*3/uL — ABNORMAL LOW (ref 150–400)
RBC: 3.14 MIL/uL — ABNORMAL LOW (ref 3.87–5.11)
RDW: 17.8 % — ABNORMAL HIGH (ref 11.5–15.5)
WBC: 8.3 10*3/uL (ref 4.0–10.5)
nRBC: 0.8 % — ABNORMAL HIGH (ref 0.0–0.2)

## 2018-11-15 LAB — HEPATIC FUNCTION PANEL
ALT: 22 U/L (ref 0–44)
AST: 36 U/L (ref 15–41)
Albumin: 2.1 g/dL — ABNORMAL LOW (ref 3.5–5.0)
Alkaline Phosphatase: 295 U/L — ABNORMAL HIGH (ref 38–126)
Bilirubin, Direct: 0.3 mg/dL — ABNORMAL HIGH (ref 0.0–0.2)
Indirect Bilirubin: 0.2 mg/dL — ABNORMAL LOW (ref 0.3–0.9)
Total Bilirubin: 0.5 mg/dL (ref 0.3–1.2)
Total Protein: 6.3 g/dL — ABNORMAL LOW (ref 6.5–8.1)

## 2018-11-15 LAB — PROTIME-INR
INR: 1.2 (ref 0.8–1.2)
Prothrombin Time: 15.1 seconds (ref 11.4–15.2)

## 2018-11-15 MED ORDER — ENSURE ENLIVE PO LIQD
237.0000 mL | Freq: Two times a day (BID) | ORAL | Status: DC
  Administered 2018-11-15 – 2018-11-24 (×14): 237 mL via ORAL

## 2018-11-15 MED ORDER — CALCIUM GLUCONATE-NACL 1-0.675 GM/50ML-% IV SOLN
1.0000 g | Freq: Once | INTRAVENOUS | Status: AC
  Administered 2018-11-15: 1000 mg via INTRAVENOUS
  Filled 2018-11-15: qty 50

## 2018-11-15 MED ORDER — FUROSEMIDE 10 MG/ML IJ SOLN
40.0000 mg | Freq: Two times a day (BID) | INTRAMUSCULAR | Status: DC
  Administered 2018-11-15 – 2018-11-16 (×2): 40 mg via INTRAVENOUS
  Filled 2018-11-15 (×2): qty 4

## 2018-11-15 MED ORDER — LIDOCAINE HCL 1 % IJ SOLN
INTRAMUSCULAR | Status: AC
  Filled 2018-11-15: qty 20

## 2018-11-15 MED ORDER — SODIUM CHLORIDE 0.9 % IV SOLN
INTRAVENOUS | Status: DC | PRN
  Administered 2018-11-16: 250 mL via INTRAVENOUS

## 2018-11-15 NOTE — Progress Notes (Signed)
CRITICAL VALUE ALERT  Critical Value:  Calcium 6.4  Date & Time Notied:  11/15/18 06:30  Provider Notified: Alfredia Ferguson via text page  Orders Received/Actions taken:

## 2018-11-15 NOTE — Procedures (Addendum)
PROCEDURE SUMMARY:  Successful image-guided left thoracentesis. Yielded 400 liters of dark red fluid. Patient tolerated procedure well. No immediate complications. EBL = 5 mL.  Specimen was not sent for labs. CXR ordered.  Alexandra Louk PA-C 11/15/2018 1:40 PM

## 2018-11-15 NOTE — Progress Notes (Signed)
Attempted limited echo to evaluate diastology per conversation with Dr.  Heart rate tachycardic at 120+ bpm.  Will attempt once HR lowers

## 2018-11-15 NOTE — Progress Notes (Signed)
PROGRESS NOTE    Suzanne [name]  UEA:[id number] DOB: Jun 27, 1960 DOA: 11/14/2018 PCP: Patient, No Pcp Per   Brief Narrative:  HPI per Dr. Harvie Bridge on 11/14/2018 HPI: Suzanne [name] is a 58 y.o. 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  Patient underwent blood transfusion yesterday and subsequently had SOB, DOE and orthopnea assoicated with cough.  She has a thoracentesis of right then left lungs on 6/2 and 6/3 and was scheduled for a repeat 3 days   Patient denies fevers/chills, weakness, dizziness, chest pain, N/V/C/D, abdominal pain, dysuria/frequency, changes in mental status.    EMS/ED Course:  Medical admission has been requested for further management of recurrent pleural effusion.  **Interim History    Assessment & Plan:   Active Problems:   Pleural effusion  Acute respiratory failure with hypoxia in the setting of recurrent left-sided pleural effusion and possibly suspected diastolic CHF exacerbation -Continue supplemental oxygen via nasal cannula and wean O2 as tolerated -Continuous pulse oximetry and maintain O2 saturation over 90% -We will start diuresis with IV 40 twice daily and attempt a thoracentesis today -Continue with DuoNeb 3 mL every 6 PRN for wheezing or shortness of breath -Continue monitor respiratory status and repeat chest x-ray in a.m. -Will need home ambulatory screen prior to discharge -May need Pleurx catheter but will need to discuss with medical oncology Dr. Jana Hakim -We will need PT OT to evaluate and treat  Recurrent L Pleural Effusion -Admitted to Inpatient Telemetry -CXR on 11/14/2018 showed "Stable radiographic appearance of the chest since yesterday. A left pleural effusion and left basilar pulmonary opacity have increased from older prior studies. Evidence of  extensive metastatic disease again noted." -IR consulted for Repeat Thoracentesis -Check INR daily -May need Pleurx catheter for further drainage   Volume overload in the setting of blood transfusion versus suspected Diastolic CHF -Continue Telemetry Monitoring  -Given blood transfusion -Given IV Lasix 20 mg last night and will repeat Dose of IV 40 mg BID if BP Allows -Strict I's/O's and Daily Weights -Last echocardiogram done on 10/27/2018 showed the left ventricle has normal systolic function with a EF of 60 to 35% however left ventricular diastolic Doppler parameters were indeterminate and limited due to acoustic windows not being able to be seen due to patient's body habitus -We will recount a repeat limited echo to assess for Diastolic Dysfunction -Continue to monitor for signs and symptoms of volume overload  Hypokalemia -Mild at 3.2 and improved to 3.5 with Repletion -Continue to Monitor and Replete as Necessary -Repeat CMP in AM   Macrocytic Anemia -Patient's Hb/Hct went from 9.5/30.7 -> 9.9/31.7 during his hospitalization but yesterday hemoglobin/hematocrit was 6.6 and was transfused 2 units of PRBCs -Check anemia panel in a.m. 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 Gay Filler -Patient was also started Ibrance but will hold currently and allow oncology to weigh in; patient: Oncology records was to start Palbociclib 1.5 mg p.o. daily when she received it -Also was started on Xgeva yesterday due to bone metastasis -Continue anastrozole 1 mg p.o. daily -Pain control with oxycodone IR 2.5 mg every 4 PRN for moderate and severe pain and then IV morphine 1  mg every 4 as needed for severe pain -Continue with antiemetics -Bowel regimen with bisacodyl 5 mg p.o. daily as needed as  well as MiraLAX 17 g p.o. daily PRN for moderate constipation along with senna docusate 1 tab p.o. nightly PRN for mild constipation  Hypocalcemia -Patient's Ca2+ this AM was 6.4 -Corrected [place]icum was -Check Ionized Ca2+ In AM  -Give 1 gram of IV Calcium Gluconate -Continue to Monitor and Replete as Necessary -Repeat CMP in AM   Obesity -Estimated body mass index is 40.81 kg/m as calculated from the following:   Height as of this encounter: 5\' 2"  (1.575 m).   Weight as of this encounter: 101.2 kg.  -Weight loss and dietary counseling given  DVT prophylaxis: Heparin 5000 units subcu q8h Code Status: FULL CODE  Family Communication: No family present at bedside  Disposition Plan: Remain Inpatient for continued work up and treatment  Consultants:   Interventional Radiology   Medical oncology   Procedures:  Consulted IR for Thoracentesis   Antimicrobials: Anti-infectives (From admission, onward)   None     Subjective: Seen and examined at bedside and she sitting in a chair and felt somewhat short of breath.  No nausea or vomiting.  Hopeful that if she had some fluid removed from her lung that she would feel better.  No chest pain, lightheadedness or dizziness.  No other concerns at this time.  Objective: Vitals:   11/15/18 0221 11/15/18 0342 11/15/18 0512 11/15/18 0601  BP: 130/79 (!) 143/114 (!) 141/70   Pulse: (!) 109 (!) 119 (!) 113   Resp: (!) 23 (!) 26 (!) 32   Temp: 99.9 F (37.7 C) 100.2 F (37.9 C)  98.6 F (37 C)  TempSrc: Oral Oral  Oral  SpO2: 98% 100% 100%   Weight:      Height:       No intake or output data in the 24 hours ending 11/15/18 1152 Filed Weights   11/14/18 1421 11/14/18 2100  Weight: 106 kg 101.2 kg   Examination: Physical Exam:  Constitutional: WN/WD obese Caucasian female currently NAD and appears calm but slightly uncomfortable sitting in chair bedside Eyes: Lids and conjunctivae normal, sclerae anicteric  ENMT: External Ears,  Nose appear normal. Grossly normal hearing. Mucous membranes are moist. Neck: Appears normal, supple, no cervical masses, normal ROM, no appreciable thyromegaly; no JVD Respiratory: Diminished to auscultation bilaterally with coarse breath sounds and worse lung sounds on the Left compared to the Right, Had some crackles. Normal respiratory effort and patient is not tachypenic. No accessory muscle use.  Cardiovascular: RRR but distant heart sounds, No appreciable m/r/g. Trace to 1+ LE Edema Abdomen: Soft, non-tender, Distended due to body habitus. No masses palpated. No appreciable hepatosplenomegaly. Bowel sounds positive x4.  GU: Deferred. Musculoskeletal: No clubbing / cyanosis of digits/nails. No joint deformity upper and lower extremities.  Skin: No rashes, lesions, ulcers on a limited skin evaluation. No induration; Warm and dry.  Neurologic: CN 2-12 grossly intact with no focal deficits. Romberg sign cerebellar reflexes not assessed.  Psychiatric: Normal judgment and insight. Alert and oriented x 3. Anxious mood and appropriate affect.   Data Reviewed: I have personally reviewed following labs and imaging studies  CBC: Recent Labs  Lab 11/13/18 0941 11/14/18 1620 11/15/18 0543  WBC 9.3 7.7 8.3  NEUTROABS 6.0 4.9  --   HGB 6.6* 9.5* 9.9*  HCT 20.8* 30.7* 31.7*  MCV 99.0 100.0 101.0*  PLT 145* 131* 010*   Basic Metabolic Panel:  Recent Labs  Lab 11/13/18 0941 11/14/18 1620 11/14/18 1621 11/15/18 0543  NA 133* 136  --  135  K 3.3* 3.2*  --  3.5  CL 93* 95*  --  96*  CO2 31 31  --  30  GLUCOSE 135* 119*  --  155*  BUN 8 9  --  8  CREATININE 0.71 0.73  --  0.65  CALCIUM 7.6* 6.8*  --  6.4*  MG  --   --  2.4  --   PHOS  --   --  2.5  --    GFR: Estimated Creatinine Clearance: 84.3 mL/min (by C-G formula based on SCr of 0.65 mg/dL). Liver Function Tests: Recent Labs  Lab 11/13/18 0941 11/14/18 1620 11/15/18 0543  AST 23 26 36  ALT 14 17 22   ALKPHOS 329* 292* 295*   BILITOT 0.8 0.7 0.5  PROT 6.3* 6.3* 6.3*  ALBUMIN 1.9* 2.2* 2.1*   No results for input(s): LIPASE, AMYLASE in the last 168 hours. No results for input(s): AMMONIA in the last 168 hours. Coagulation Profile: Recent Labs  Lab 11/15/18 0543  INR 1.2   Cardiac Enzymes: No results for input(s): CKTOTAL, CKMB, CKMBINDEX, TROPONINI in the last 168 hours. BNP (last 3 results) No results for input(s): PROBNP in the last 8760 hours. HbA1C: No results for input(s): HGBA1C in the last 72 hours. CBG: No results for input(s): GLUCAP in the last 168 hours. Lipid Profile: No results for input(s): CHOL, HDL, LDLCALC, TRIG, CHOLHDL, LDLDIRECT in the last 72 hours. Thyroid Function Tests: Recent Labs    11/14/18 1620  TSH 4.251   Anemia Panel: No results for input(s): VITAMINB12, FOLATE, FERRITIN, TIBC, IRON, RETICCTPCT in the last 72 hours. Sepsis Labs: No results for input(s): PROCALCITON, LATICACIDVEN in the last 168 hours.  Recent Results (from the past 240 hour(s))  SARS Coronavirus 2 (CEPHEID - Performed in [facility] hospital lab), Hosp Order     Status: None   Collection Time: 11/14/18  4:34 PM   Specimen: Nasopharyngeal Swab  Result Value Ref Range Status   SARS Coronavirus 2 NEGATIVE NEGATIVE Final    Comment: (NOTE) If result is NEGATIVE SARS-CoV-2 target nucleic acids are NOT DETECTED. The SARS-CoV-2 RNA is generally detectable in upper and lower  respiratory specimens during the acute phase of infection. The lowest  concentration of SARS-CoV-2 viral copies this assay can detect is 250  copies / mL. A negative result does not preclude SARS-CoV-2 infection  and should not be used as the sole basis for treatment or other  patient management decisions.  A negative result may occur with  improper specimen collection / handling, submission of specimen other  than nasopharyngeal swab, presence of viral mutation(s) within the  areas targeted by this assay, and inadequate number  of viral copies  (<250 copies / mL). A negative result must be combined with clinical  observations, patient history, and epidemiological information. If result is POSITIVE SARS-CoV-2 target nucleic acids are DETECTED. The SARS-CoV-2 RNA is generally detectable in upper and lower  respiratory specimens dur ing the acute phase of infection.  Positive  results are indicative of active infection with SARS-CoV-2.  Clinical  correlation with patient history and other diagnostic information is  necessary to determine patient infection status.  Positive results do  not rule out bacterial infection or co-infection with other viruses. If result is PRESUMPTIVE POSTIVE SARS-CoV-2 nucleic acids MAY BE PRESENT.   A presumptive positive result was obtained on the  submitted specimen  and confirmed on repeat testing.  While 2019 novel coronavirus  (SARS-CoV-2) nucleic acids may be present in the submitted sample  additional confirmatory testing may be necessary for epidemiological  and / or clinical management purposes  to differentiate between  SARS-CoV-2 and other Sarbecovirus currently known to infect humans.  If clinically indicated additional testing with an alternate test  methodology [phone]) is advised. The SARS-CoV-2 RNA is generally  detectable in upper and lower respiratory sp ecimens during the acute  phase of infection. The expected result is Negative. Fact Sheet for Patients:  StrictlyIdeas.no Fact Sheet for Healthcare Providers: BankingDealers.co.za This test is not yet approved or cleared by the [place] FDA and has been authorized for detection and/or diagnosis of SARS-CoV-2 by FDA under an Emergency Use Authorization (EUA).  This EUA will remain in effect (meaning this test can be used) for the duration of the COVID-19 declaration under Section 564(b)(1) of the Act, 21 U.S.C. section 360bbb-3(b)(1), unless the authorization is  terminated or revoked sooner. Performed at [facility], [place] [address] [place], [place] [address]     Radiology Studies: Dg Chest 2 View  Result Date: 11/14/2018 CLINICAL DATA:  Stage IV breast cancer.  Shortness of breath. EXAM: CHEST - 2 VIEW COMPARISON:  Radiographs 11/13/2018 and 10/29/2018.  CT 10/26/2018. FINDINGS: The heart size and mediastinal contours are stable. Left pleural effusion and left basilar opacity are unchanged from the recent study. Diffusely increased interstitial markings are again noted throughout both lungs, remaining suspicious for lymphangitic spread of tumor based on previous CT. No progressive airspace opacities identified. Diffuse osseous metastatic disease noted without evidence of acute fracture. IMPRESSION: Stable radiographic appearance of the chest since yesterday. A left pleural effusion and left basilar pulmonary opacity have increased from older prior studies. Evidence of extensive metastatic disease again noted. Electronically Signed   By: Richardean Sale M.D.   On: 11/14/2018 17:10   Dg Chest 2 View  Result Date: 11/13/2018 CLINICAL DATA:  Shortness of breath, weakness. Cancer patient. EXAM: CHEST - 2 VIEW COMPARISON:  Chest x-ray dated 10/29/2018. Chest CT dated 10/26/2018. FINDINGS: Heart size and mediastinal contours appear stable. Increased opacity at the LEFT lung base, likely increased pleural effusion. There is also central pulmonary vascular congestion and bilateral perihilar edema. No pneumothorax seen. Diffuse osseous metastases, as described on previous CT report. IMPRESSION: 1. Increased opacity at the LEFT lung base, likely increased pleural effusion. Superimposed pneumonia or airspace collapse not excluded. 2. Central pulmonary vascular congestion and bilateral perihilar edema indicating CHF/volume overload. 3. Diffuse osseous metastases. Electronically Signed   By: Franki Cabot M.D.   On: 11/13/2018 12:32   Dg Abd 2 Views   Result Date: 11/13/2018 CLINICAL DATA:  Pt c/o sob, weakness, cancer patient. EXAM: ABDOMEN - 2 VIEW COMPARISON:  None. FINDINGS: Overall visualized bowel gas pattern is nonobstructive. Moderate amount of stool and gas throughout the colon. No evidence of free intraperitoneal air seen. Diffuse osseous metastases, as previously described. IMPRESSION: 1. Nonobstructive bowel gas pattern. Moderate amount of stool and gas throughout the colon. 2. Diffuse osseous metastases. Electronically Signed   By: Franki Cabot M.D.   On: 11/13/2018 12:33   Scheduled Meds: . anastrozole  1 mg Oral Daily  . feeding supplement (ENSURE ENLIVE)  237 mL Oral BID BM  . heparin  5,000 Units Subcutaneous Q8H   Continuous Infusions: . sodium chloride      LOS: 1 day   [employer],  DO [facility] PAGER is on AMION  If 7PM-7AM, please contact night-coverage [email or web address] Password [email or web address] 11/15/2018, 11:52 AM

## 2018-11-15 NOTE — Progress Notes (Signed)
PT Cancellation Note  Patient Details Name: Suzanne [name] MRN: [id number] DOB: 12-Jul-1960   Cancelled Treatment:    Reason Eval/Treat Not Completed: Patient at procedure or test/unavailable(pt just left room for thoracentesis. Will follow.)  Philomena Doheny PT 11/15/2018  Acute Rehabilitation Services Pager [phone] Office [phone]

## 2018-11-16 ENCOUNTER — Inpatient Hospital Stay (HOSPITAL_COMMUNITY): Payer: Medicaid Other

## 2018-11-16 DIAGNOSIS — E8809 Other disorders of plasma-protein metabolism, not elsewhere classified: Secondary | ICD-10-CM | POA: Diagnosis present

## 2018-11-16 DIAGNOSIS — C50911 Malignant neoplasm of unspecified site of right female breast: Secondary | ICD-10-CM

## 2018-11-16 LAB — BLOOD GAS, ARTERIAL
Acid-Base Excess: 7.4 mmol/L — ABNORMAL HIGH (ref 0.0–2.0)
Acid-Base Excess: 9.6 mmol/L — ABNORMAL HIGH (ref 0.0–2.0)
Bicarbonate: 31.7 mmol/L — ABNORMAL HIGH (ref 20.0–28.0)
Bicarbonate: 33.3 mmol/L — ABNORMAL HIGH (ref 20.0–28.0)
Delivery systems: POSITIVE
Drawn by: 232811
Drawn by: 441261
Expiratory PAP: 8
FIO2: 35
Inspiratory PAP: 12
O2 Content: 5 L/min
O2 Saturation: 96.4 %
O2 Saturation: 98.1 %
Patient temperature: 38.2
Patient temperature: 99
pCO2 arterial: 44.1 mmHg (ref 32.0–48.0)
pCO2 arterial: 44.9 mmHg (ref 32.0–48.0)
pH, Arterial: 7.464 — ABNORMAL HIGH (ref 7.350–7.450)
pH, Arterial: 7.496 — ABNORMAL HIGH (ref 7.350–7.450)
pO2, Arterial: 115 mmHg — ABNORMAL HIGH (ref 83.0–108.0)
pO2, Arterial: 82.5 mmHg — ABNORMAL LOW (ref 83.0–108.0)

## 2018-11-16 LAB — CBC WITH DIFFERENTIAL/PLATELET
Abs Immature Granulocytes: 0.2 10*3/uL — ABNORMAL HIGH (ref 0.00–0.07)
Basophils Absolute: 0 10*3/uL (ref 0.0–0.1)
Basophils Relative: 0 %
Eosinophils Absolute: 0 10*3/uL (ref 0.0–0.5)
Eosinophils Relative: 0 %
HCT: 28.9 % — ABNORMAL LOW (ref 36.0–46.0)
Hemoglobin: 9 g/dL — ABNORMAL LOW (ref 12.0–15.0)
Immature Granulocytes: 2 %
Lymphocytes Relative: 21 %
Lymphs Abs: 2 10*3/uL (ref 0.7–4.0)
MCH: 31.1 pg (ref 26.0–34.0)
MCHC: 31.1 g/dL (ref 30.0–36.0)
MCV: 100 fL (ref 80.0–100.0)
Monocytes Absolute: 0.5 10*3/uL (ref 0.1–1.0)
Monocytes Relative: 5 %
Neutro Abs: 6.6 10*3/uL (ref 1.7–7.7)
Neutrophils Relative %: 72 %
Platelets: UNDETERMINED 10*3/uL (ref 150–400)
RBC: 2.89 MIL/uL — ABNORMAL LOW (ref 3.87–5.11)
RDW: 17.9 % — ABNORMAL HIGH (ref 11.5–15.5)
WBC: 9.3 10*3/uL (ref 4.0–10.5)
nRBC: 0.5 % — ABNORMAL HIGH (ref 0.0–0.2)

## 2018-11-16 LAB — COMPREHENSIVE METABOLIC PANEL
ALT: 18 U/L (ref 0–44)
AST: 31 U/L (ref 15–41)
Albumin: 1.9 g/dL — ABNORMAL LOW (ref 3.5–5.0)
Alkaline Phosphatase: 268 U/L — ABNORMAL HIGH (ref 38–126)
Anion gap: 12 (ref 5–15)
BUN: 10 mg/dL (ref 6–20)
CO2: 29 mmol/L (ref 22–32)
Calcium: 5.8 mg/dL — CL (ref 8.9–10.3)
Chloride: 94 mmol/L — ABNORMAL LOW (ref 98–111)
Creatinine, Ser: 0.66 mg/dL (ref 0.44–1.00)
GFR calc Af Amer: >60 mL/min (ref >60)
GFR calc non Af Amer: >60 mL/min (ref >60)
Glucose, Bld: 119 mg/dL — ABNORMAL HIGH (ref 70–99)
Potassium: 3.7 mmol/L (ref 3.5–5.1)
Sodium: 135 mmol/L (ref 135–145)
Total Bilirubin: 0.8 mg/dL (ref 0.3–1.2)
Total Protein: 5.9 g/dL — ABNORMAL LOW (ref 6.5–8.1)

## 2018-11-16 LAB — LACTIC ACID, PLASMA
Lactic Acid, Venous: 1.5 mmol/L (ref 0.5–1.9)
Lactic Acid, Venous: 1.3 mmol/L (ref 0.5–1.9)

## 2018-11-16 LAB — MAGNESIUM: Magnesium: 1.9 mg/dL (ref 1.7–2.4)

## 2018-11-16 LAB — MRSA PCR SCREENING: MRSA by PCR: NEGATIVE

## 2018-11-16 LAB — PHOSPHORUS: Phosphorus: 2.4 mg/dL — ABNORMAL LOW (ref 2.5–4.6)

## 2018-11-16 LAB — PROCALCITONIN: Procalcitonin: 1.02 ng/mL

## 2018-11-16 MED ORDER — PIPERACILLIN-TAZOBACTAM 3.375 G IVPB 30 MIN: 3.3750 g | Freq: Four times a day (QID) | INTRAVENOUS | Status: DC

## 2018-11-16 MED ORDER — LORAZEPAM 0.5 MG PO TABS
0.5000 mg | ORAL_TABLET | Freq: Once | ORAL | Status: AC
  Administered 2018-11-16: 0.5 mg via ORAL
  Filled 2018-11-16: qty 1

## 2018-11-16 MED ORDER — K PHOS MONO-SOD PHOS DI & MONO 155-852-130 MG PO TABS
500.0000 mg | ORAL_TABLET | Freq: Once | ORAL | Status: AC
  Administered 2018-11-16: 500 mg via ORAL
  Filled 2018-11-16: qty 2

## 2018-11-16 MED ORDER — BUDESONIDE 0.25 MG/2ML IN SUSP
0.2500 mg | Freq: Two times a day (BID) | RESPIRATORY_TRACT | Status: DC
  Administered 2018-11-16 (×2): 0.25 mg via RESPIRATORY_TRACT
  Filled 2018-11-16: qty 2

## 2018-11-16 MED ORDER — LEVALBUTEROL HCL 0.63 MG/3ML IN NEBU
0.6300 mg | INHALATION_SOLUTION | Freq: Four times a day (QID) | RESPIRATORY_TRACT | Status: DC
  Administered 2018-11-16 (×3): 0.63 mg via RESPIRATORY_TRACT
  Filled 2018-11-16 (×2): qty 3

## 2018-11-16 MED ORDER — FUROSEMIDE 10 MG/ML IJ SOLN
80.0000 mg | Freq: Two times a day (BID) | INTRAMUSCULAR | Status: DC
  Administered 2018-11-16: 80 mg via INTRAVENOUS
  Filled 2018-11-16: qty 8

## 2018-11-16 MED ORDER — ALBUMIN HUMAN 25 % IV SOLN
25.0000 g | Freq: Four times a day (QID) | INTRAVENOUS | Status: AC
  Administered 2018-11-17 (×4): 25 g via INTRAVENOUS
  Filled 2018-11-16: qty 100
  Filled 2018-11-16: qty 50
  Filled 2018-11-16 (×2): qty 100
  Filled 2018-11-16: qty 50

## 2018-11-16 MED ORDER — CALCIUM GLUCONATE-NACL 1-0.675 GM/50ML-% IV SOLN
1.0000 g | Freq: Once | INTRAVENOUS | Status: AC
  Administered 2018-11-16: 1000 mg via INTRAVENOUS
  Filled 2018-11-16: qty 50

## 2018-11-16 MED ORDER — ARFORMOTEROL TARTRATE 15 MCG/2ML IN NEBU
15.0000 ug | INHALATION_SOLUTION | Freq: Two times a day (BID) | RESPIRATORY_TRACT | Status: DC
  Administered 2018-11-16 (×2): 15 ug via RESPIRATORY_TRACT
  Filled 2018-11-16: qty 2

## 2018-11-16 MED ORDER — LORAZEPAM 2 MG/ML IJ SOLN
0.5000 mg | Freq: Four times a day (QID) | INTRAMUSCULAR | Status: DC | PRN
  Administered 2018-11-16 – 2018-11-30 (×16): 0.5 mg via INTRAVENOUS
  Filled 2018-11-16 (×16): qty 1

## 2018-11-16 MED ORDER — PIPERACILLIN-TAZOBACTAM 3.375 G IVPB
3.3750 g | Freq: Three times a day (TID) | INTRAVENOUS | Status: AC
  Administered 2018-11-17 – 2018-11-19 (×8): 3.375 g via INTRAVENOUS
  Filled 2018-11-16 (×8): qty 50

## 2018-11-16 MED ORDER — IPRATROPIUM BROMIDE 0.02 % IN SOLN
0.5000 mg | Freq: Four times a day (QID) | RESPIRATORY_TRACT | Status: DC
  Administered 2018-11-16 (×3): 0.5 mg via RESPIRATORY_TRACT
  Filled 2018-11-16 (×2): qty 2.5

## 2018-11-16 MED ORDER — MORPHINE SULFATE (PF) 2 MG/ML IV SOLN
1.0000 mg | INTRAVENOUS | Status: DC
  Administered 2018-11-16 – 2018-11-18 (×4): 1 mg via INTRAVENOUS
  Filled 2018-11-16 (×4): qty 1

## 2018-11-16 MED ORDER — SODIUM CHLORIDE 0.9 % IV SOLN
1.0000 g | Freq: Three times a day (TID) | INTRAVENOUS | Status: DC
  Administered 2018-11-16 (×2): 1 g via INTRAVENOUS
  Filled 2018-11-16 (×3): qty 1

## 2018-11-16 MED ORDER — LEVALBUTEROL HCL 0.63 MG/3ML IN NEBU
0.6300 mg | INHALATION_SOLUTION | Freq: Four times a day (QID) | RESPIRATORY_TRACT | Status: DC | PRN
  Filled 2018-11-16: qty 3

## 2018-11-16 MED ORDER — FUROSEMIDE 10 MG/ML IJ SOLN
40.0000 mg | Freq: Two times a day (BID) | INTRAMUSCULAR | Status: DC
  Administered 2018-11-17 – 2018-11-20 (×7): 40 mg via INTRAVENOUS
  Filled 2018-11-16 (×7): qty 4

## 2018-11-16 NOTE — Progress Notes (Signed)
Pharmacy Antibiotic Note  Suzanne [name] is a 58 y.o. female with inflammatory breast cancer, metastatic to lungs, lymphatic system, and bone, complicated by bilateral malignant pleural effusions and anemia, admitted on 11/14/2018 with worsening shortness of breath.  Consult received 11/16/2018 to begin cefepime for pneumonia with pharmacy dosing assistance.   Plan: Cefepime 1 gram IV q8h Follow clinical course, DOT    Height: 5\' 2"  (157.5 cm) Weight: 221 lb 12.5 oz (100.6 kg) IBW/kg (Calculated) : 50.1  Temp (24hrs), Avg:99.8 F (37.7 C), Min:99.1 F (37.3 C), Max:100.5 F (38.1 C)  Recent Labs  Lab 11/13/18 0941 11/14/18 1620 11/15/18 0543 11/16/18 0502  WBC 9.3 7.7 8.3 9.3  CREATININE 0.71 0.73 0.65 0.66    Estimated Creatinine Clearance: 84 mL/min (by C-G formula based on SCr of 0.66 mg/dL).    No Known Allergies  Antimicrobials this admission: 6/21 cefepime >>    Dose adjustments this admission:   Microbiology results: 6/19 SARSCoV2: negative  Thank you for allowing pharmacy to be a part of this patient's care.  Clayburn Pert, PharmD, BCPS [phone] 11/16/2018  8:22 AM

## 2018-11-16 NOTE — Consult Note (Addendum)
NAME:  Suzanne [name] MRN:  [id number] DOB:  Aug 05, 1960 LOS: 2 ADMISSION DATE:  11/14/2018  CONSULTATION DATE:  11/16/2018 REFERRING MD:  Dr. Raiford Noble  REASON FOR CONSULTATION:  Respiratory distress   Initial Pulmonary/Critical Care Consultation  Brief History   N/A  History of present illness   This 58 y.o. Caucasian female non-smoker presented to the [facility] via EMS on 11/14/2018 with complaints of bilateral lower extremity edema.  She was apparently admitted with a working diagnosis of bilateral pleural effusions, suspected to be due to stage IV breast cancer.  She is supposed to be on supplemental oxygen at home (presumably for palliative care, as she denies history of chronic heart or lung disease) but cannot afford oxygen.  She was apparently admitted to a medical floor bed but has been transferred to 1233 due to increased work of breathing.  She is DNR/DNI.  She was given a single dose of furosemide 80 mg IV and was placed on BiPAP and appears to be tolerating it well.  Denies abdominal distention, belching or flatus.  Denies new pain.  COVID-19 negative (11/14/2018).  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 She did undergo blood transfusion on 11/13/2018 and subsequently had SOB, DOE and orthopnea assoicated with cough. She has a thoracentesis of right then left lungs on 6/2 and 6/3 and was scheduled for a repeat 3 days.  REVIEW OF SYSTEMS Constitutional: No weight loss. No night sweats. No fever. No chills. No fatigue. HEENT: No headaches, dysphagia, sore throat, otalgia, nasal congestion, PND CV:  Bilateral lower extremity edema.  No chest pain, orthopnea, PND, swelling in lower extremities, palpitations GI:  Mild RLQ abdominal pain.  No nausea, vomiting, diarrhea, change in bowel pattern, anorexia. Resp: Dyspnea, increased work of breathing.  Nonproductive cough.  No hemoptysis,  wheezing.  GU: no dysuria, change in color of urine, no urgency or frequency.  No flank pain. MS:  No joint pain or swelling. No myalgias,  No decreased range of motion.  Psych:  No change in mood or affect. No memory loss. Skin: no rash or lesions.   Past Medical/Surgical/Social/Family History   Past Medical History:  Diagnosis Date  . Cancer ([place])   . Gallstones   . Mastitis    reports history of recurrent mastitis  . Metastatic breast cancer ([place])   . Obesity     Past Surgical History:  Procedure Laterality Date  . IR THORACENTESIS ASP PLEURAL SPACE W/IMG GUIDE  10/28/2018  . IR THORACENTESIS ASP PLEURAL SPACE W/IMG GUIDE  10/29/2018    Social History   Tobacco Use  . Smoking status: Never Smoker  . Smokeless tobacco: Never Used  Substance Use Topics  . Alcohol use: No    Family History  Problem Relation Age of Onset  . Breast cancer Sister        in her 84's-70s  . Heart disease Brother        CABG  . Heart disease Brother        CABG  . Skin cancer Brother        melanoma  . Colon cancer Cousin   . Heart attack Mother   . Heart attack Father   . Prostate cancer [facility] Events   6/19: admitted with increased dyspnea 6/21: transferred to stepdown due to increased work of breathing.  Started BiPAP.  Consults:  Oncology Pulmonary/Critical Care   Procedures:  N/A   Significant Diagnostic Tests:  10/28/2018: R thoracentesis --> malignant pleural effusion. GATA-3 (+), ER (+). Negative for GCDFP, calretinin, cytokeratin 5/6, MOC-31, PAX-8.   Micro Data:   Results for orders placed or performed during the hospital encounter of 11/14/18  SARS Coronavirus 2 (CEPHEID - Performed in [place] hospital lab), Hosp Order     Status: None   Collection Time: 11/14/18  4:34 PM   Specimen: Nasopharyngeal Swab  Result Value Ref Range Status   SARS Coronavirus 2 NEGATIVE NEGATIVE Final    Comment: (NOTE) If result is NEGATIVE  SARS-CoV-2 target nucleic acids are NOT DETECTED. The SARS-CoV-2 RNA is generally detectable in upper and lower  respiratory specimens during the acute phase of infection. The lowest  concentration of SARS-CoV-2 viral copies this assay can detect is 250  copies / mL. A negative result does not preclude SARS-CoV-2 infection  and should not be used as the sole basis for treatment or other  patient management decisions.  A negative result may occur with  improper specimen collection / handling, submission of specimen other  than nasopharyngeal swab, presence of viral mutation(s) within the  areas targeted by this assay, and inadequate number of viral copies  (<250 copies / mL). A negative result must be combined with clinical  observations, patient history, and epidemiological information. If result is POSITIVE SARS-CoV-2 target nucleic acids are DETECTED. The SARS-CoV-2 RNA is generally detectable in upper and lower  respiratory specimens dur ing the acute phase of infection.  Positive  results are indicative of active infection with SARS-CoV-2.  Clinical  correlation with patient history and other diagnostic information is  necessary to determine patient infection status.  Positive results do  not rule out bacterial infection or co-infection with other viruses. If result is PRESUMPTIVE POSTIVE SARS-CoV-2 nucleic acids MAY BE PRESENT.   A presumptive positive result was obtained on the submitted specimen  and confirmed on repeat testing.  While 2019 novel coronavirus  (SARS-CoV-2) nucleic acids may be present in the submitted sample  additional confirmatory testing may be necessary for epidemiological  and / or clinical management purposes  to differentiate between  SARS-CoV-2 and other Sarbecovirus currently known to infect humans.  If clinically indicated additional testing with an alternate test  methodology [phone]) is advised. The SARS-CoV-2 RNA is generally  detectable in upper  and lower respiratory sp ecimens during the acute  phase of infection. The expected result is Negative. Fact Sheet for Patients:  StrictlyIdeas.no Fact Sheet for Healthcare Providers: BankingDealers.co.za This test is not yet approved or cleared by the [place] FDA and has been authorized for detection and/or diagnosis of SARS-CoV-2 by FDA under an Emergency Use Authorization (EUA).  This EUA will remain in effect (meaning this test can be used) for the duration of the COVID-19 declaration under Section 564(b)(1) of the Act, 21 U.S.C. section 360bbb-3(b)(1), unless the authorization is terminated or revoked sooner. Performed at [facility], [place] [address], [place], [place] [address]   MRSA PCR Screening     Status: None   Collection Time: 11/16/18  8:02 AM   Specimen: Nasal Mucosa; Nasopharyngeal  Result Value Ref Range Status   MRSA by PCR NEGATIVE NEGATIVE Final    Comment:        The GeneXpert MRSA Assay (FDA approved for NASAL specimens only), is one component of a comprehensive MRSA colonization surveillance program. It is not intended to  diagnose MRSA infection nor to guide or monitor treatment for MRSA infections. Performed at [facility], [place] [address] [place], [place] [address]       Antimicrobials:  N/A  Interim history/subjective:  N/A   Objective   BP (!) 169/145   Pulse (!) 120   Temp 100.2 F (37.9 C) (Core)   Resp (!) 32   Ht 5\' 2"  (1.575 m)   Wt 98.3 kg   LMP 05/28/2013 (Within Months)   SpO2 100%   BMI 39.64 kg/m     Filed Weights   11/14/18 2100 11/16/18 0627 11/16/18 1908  Weight: 101.2 kg 100.6 kg 98.3 kg    Intake/Output Summary (Last 24 hours) at 11/16/2018 2153 Last data filed at 11/16/2018 1946 Gross per 24 hour  Intake 600 ml  Output 1450 ml  Net -850 ml    FiO2 (%):  [35 %] 35 %   Examination: GENERAL: alert, oriented to time,  person and place, normal mood, behavior, speech and thought processes, affect appropriate to mood.  Obese. No acute distress. HEAD: normocephalic, atraumatic EYE: PERRLA, EOM intact, no scleral icterus, no pallor. NOSE: nares are patent. No polyps. No exudate. THROAT/ORAL CAVITY: Normal dentition. No oral thrush. No exudate. Mucous membranes are moist. No tonsillar enlargement. Mallampati class IV (only hard palate visible) airway. NECK: supple, no thyromegaly, no JVD, no lymphadenopathy. Trachea midline. CHEST/LUNG: symmetric in development and expansion. Diminished breath sounds in the bases. No crackles. No wheezes. HEART: Regular S1 and S2 without murmur, rub or gallop. ABDOMEN: soft, nontender, nondistended. Normoactive bowel sounds. No rebound. No guarding. No hepatosplenomegaly. EXTREMITIES: Edema: 2+ and pitting. No cyanosis. No clubbing. 2+ DP pulses LYMPHATIC: no cervical/axillary/inguinal lymph nodes appreciated MUSCULOSKELETAL: No point tenderness. No bulk atrophy. Joints: normal.  SKIN:  Bilateral petchial rash at the dorsa of the feet. NEUROLOGIC: Doll's eyes intact. Corneal reflex intact. Spontaneous respirations intact. Cranial nerves II-XII are grossly symmetric and physiologic. Babinski absent. No sensory deficit. Motor: 5/5 @ RUE, 5/5 @ LUE, 5/5 @ RLL,  5/5 @ LLL.  DTR: 2+ @ R biceps, 2+ @ L biceps, 2+ @ R patellar,  2+ @ L patellar. No cerebellar signs. Gait was not assessed.   Resolved Hospital Problem list   N/A   Assessment & Plan:   ASSESSMENT/PLAN:  ASSESSMENT (included in the Hospital Problem List)  Active Problems:   Shortness of breath   Pleural effusion, bilateral   Carcinoma of breast, estrogen receptor positive, stage 4, right (HCC)   Hypoalbuminemia   Morbid obesity with BMI of 40.0-44.9, adult ([place])   By systems: PULMONARY  Bilateral pleural effusions  Pulmonary edema Chest CT without contrast in am. BiPAP 12/8, FiO2 35%.  Settings adjusted at  bedside. While this patient may experience some obesity-related gas trapping, this is not currently supported by the most recent ABG results.  She has no history of chronic lung disease.  Xopenex PRN for wheezing.   CARDIOVASCULAR  No acute issues   RENAL  No acute issues With normal renal function, decrease furosemide to 40 mg IV q 12 hr. Add albumin.   GASTROINTESTINAL  Hypoalbuminemia  GI PROPHYLAXIS: Protonix 25% Albumin 25 g IV q 6 hr x 4 doses. Check prealbumin.   HEMATOLOGIC/ONCOLOGIC  Breast cancer, stage IV, GATA-3 (+), ER (+)  DVT PROPHYLAXIS: heparin On Arimidex and Ibrance   INFECTIOUS  No acute issues Elevated procalcitonin. Change cefepime to Zosyn for anaerobic/aspiration coverage.   ENDOCRINE  Obesity   NEUROLOGIC  Chronic pain with chronic opiate use Takes oxycodone 0.5 mg PO q 4 hr PRN at home. Morphine 1 mg IV q 2 hr.  Hold dose for any decrease in mental status (including sleep) or RR < 12.    My assessment, plan of care, findings, medications, side effects, etc. were discussed with: nurse and respiratory therapist.   Best practice:  Diet: per Hospitalist Pain/Anxiety/Delirium protocol (if indicated): YES VAP protocol (if indicated): aspiration precautions DVT prophylaxis: heparin GI prophylaxis: Protonix Glucose control: N/A Mobility/Activity: PT consult CODE STATUS: FULL CODE. Family Communication:  no family at bedside Disposition: at the discretion of the admitting physician   Labs   CBC: Recent Labs  Lab 11/13/18 0941 11/14/18 1620 11/15/18 0543 11/16/18 0502  WBC 9.3 7.7 8.3 9.3  NEUTROABS 6.0 4.9  --  6.6  HGB 6.6* 9.5* 9.9* 9.0*  HCT 20.8* 30.7* 31.7* 28.9*  MCV 99.0 100.0 101.0* 100.0  PLT 145* 131* 135* PLATELET CLUMPS NOTED ON SMEAR, UNABLE TO ESTIMATE    Basic Metabolic Panel: Recent Labs  Lab 11/13/18 0941 11/14/18 1620 11/14/18 1621 11/15/18 0543 11/16/18 0502  NA 133* 136  --  135 135  K 3.3* 3.2*   --  3.5 3.7  CL 93* 95*  --  96* 94*  CO2 31 31  --  30 29  GLUCOSE 135* 119*  --  155* 119*  BUN 8 9  --  8 10  CREATININE 0.71 0.73  --  0.65 0.66  CALCIUM 7.6* 6.8*  --  6.4* 5.8*  MG  --   --  2.4  --  1.9  PHOS  --   --  2.5  --  2.4*   GFR: Estimated Creatinine Clearance: 83 mL/min (by C-G formula based on SCr of 0.66 mg/dL). Recent Labs  Lab 11/13/18 0941 11/14/18 1620 11/15/18 0543 11/16/18 0502 11/16/18 0910 11/16/18 1158  PROCALCITON  --   --   --   --  1.02  --   WBC 9.3 7.7 8.3 9.3  --   --   LATICACIDVEN  --   --   --   --  1.5 1.3    Liver Function Tests: Recent Labs  Lab 11/13/18 0941 11/14/18 1620 11/15/18 0543 11/16/18 0502  AST 23 26 36 31  ALT 14 17 22 18   ALKPHOS 329* 292* 295* 268*  BILITOT 0.8 0.7 0.5 0.8  PROT 6.3* 6.3* 6.3* 5.9*  ALBUMIN 1.9* 2.2* 2.1* 1.9*   No results for input(s): LIPASE, AMYLASE in the last 168 hours. No results for input(s): AMMONIA in the last 168 hours.  ABG    Component Value Date/Time   PHART 7.464 (H) 11/16/2018 1845   PCO2ART 44.9 11/16/2018 1845   PO2ART 115 (H) 11/16/2018 1845   HCO3 31.7 (H) 11/16/2018 1845   O2SAT 98.1 11/16/2018 1845     Coagulation Profile: Recent Labs  Lab 11/15/18 0543  INR 1.2    Cardiac Enzymes: No results for input(s): CKTOTAL, CKMB, CKMBINDEX, TROPONINI in the last 168 hours.  HbA1C: Hgb A1c MFr Bld  Date/Time Value Ref Range Status  10/27/2018 08:59 AM 5.3 4.8 - 5.6 % Final    Comment:    (NOTE) Pre diabetes:          5.7%-6.4% Diabetes:              >6.4% Glycemic control for   <7.0% adults with diabetes     CBG: No results for input(s): GLUCAP in the last  168 hours.   Review of Systems:   See above   Past Medical History   Past Medical History:  Diagnosis Date  . Cancer ([place])   . Gallstones   . Mastitis    reports history of recurrent mastitis  . Metastatic breast cancer ([place])   . Obesity       Surgical History    Past Surgical History:   Procedure Laterality Date  . IR THORACENTESIS ASP PLEURAL SPACE W/IMG GUIDE  10/28/2018  . IR THORACENTESIS ASP PLEURAL SPACE W/IMG GUIDE  10/29/2018      Social History   Social History   Socioeconomic History  . Marital status: Widowed    Spouse name: Not on file  . Number of children: 1  . Years of education: Not on file  . Highest education level: Not on file  Occupational History  . Occupation: Doctor, general practice    Comment: Self-employed  Social Needs  . Financial resource strain: Not on file  . Food insecurity    Worry: Not on file    Inability: Not on file  . Transportation needs    Medical: Not on file    Non-medical: Not on file  Tobacco Use  . Smoking status: Never Smoker  . Smokeless tobacco: Never Used  Substance and Sexual Activity  . Alcohol use: No  . Drug use: No  . Sexual activity: Not Currently  Lifestyle  . Physical activity    Days per week: Not on file    Minutes per session: Not on file  . Stress: Not on file  Relationships  . Social Herbalist on phone: Not on file    Gets together: Not on file    Attends religious service: Not on file    Active member of club or organization: Not on file    Attends meetings of clubs or organizations: Not on file    Relationship status: Not on file  Other Topics Concern  . Not on file  Social History Narrative   Has a daughter named [name]. Daughter has CP.      Family History    Family History  Problem Relation Age of Onset  . Breast cancer Sister        in her 37's-70s  . Heart disease Brother        CABG  . Heart disease Brother        CABG  . Skin cancer Brother        melanoma  . Colon cancer Cousin   . Heart attack Mother   . Heart attack Father   . Prostate cancer Brother    family history includes Breast cancer in her sister; Colon cancer in her cousin; Heart attack in her father and mother; Heart disease in her brother and brother; Prostate cancer in her brother;  Skin cancer in her brother.    Allergies No Known Allergies    Current Medications  Current Facility-Administered Medications:  .  0.9 %  sodium chloride infusion, , Intravenous, PRN, Raiford Noble [place], DO, Last Rate: 10 mL/hr at 11/16/18 0842, 250 mL at 11/16/18 0842 .  acetaminophen (TYLENOL) tablet 650 mg, 650 mg, Oral, Q6H PRN, 650 mg at 11/16/18 1201 **OR** acetaminophen (TYLENOL) suppository 650 mg, 650 mg, Rectal, Q6H PRN, Hugelmeyer, Alexis, DO .  anastrozole (ARIMIDEX) tablet 1 mg, 1 mg, Oral, Daily, Hugelmeyer, Alexis, DO, 1 mg at 11/16/18 0847 .  arformoterol (BROVANA) nebulizer solution 15 mcg, 15 mcg, Nebulization,  BID, Raiford Noble [place], [place], 15 mcg at 11/16/18 2019 .  bisacodyl (DULCOLAX) EC tablet 5 mg, 5 mg, Oral, Daily PRN, Hugelmeyer, Alexis, DO .  budesonide (PULMICORT) nebulizer solution 0.25 mg, 0.25 mg, Nebulization, BID, Sheikh, Omair Latif, DO, 0.25 mg at 11/16/18 2020 .  ceFEPIme (MAXIPIME) 1 g in sodium chloride 0.9 % 100 mL IVPB, 1 g, Intravenous, Q8H, Absher, Randall K, RPH, Last Rate: 200 mL/hr at 11/16/18 2124, 1 g at 11/16/18 2124 .  clotrimazole (LOTRIMIN) 1 % cream, , Topical, BID PRN, Hugelmeyer, Alexis, DO, 1 application at 87/56/43 0315 .  feeding supplement (ENSURE ENLIVE) (ENSURE ENLIVE) liquid 237 mL, 237 mL, Oral, BID BM, Hugelmeyer, Alexis, DO, 237 mL at 11/16/18 1515 .  furosemide (LASIX) injection 80 mg, 80 mg, Intravenous, BID, Raiford Noble Latif, DO, 80 mg at 11/16/18 1806 .  heparin injection 5,000 Units, 5,000 Units, Subcutaneous, Q8H, Hugelmeyer, Alexis, DO, 5,000 Units at 11/16/18 2125 .  ipratropium (ATROVENT) nebulizer solution 0.5 mg, 0.5 mg, Nebulization, Q6H, Sheikh, Omair Latif, DO, 0.5 mg at 11/16/18 2019 .  ipratropium-albuterol (DUONEB) 0.5-2.5 (3) MG/3ML nebulizer solution 3 mL, 3 mL, Nebulization, Q6H PRN, [facility], Wlad[place], MD .  levalbuterol (XOPENEX) nebulizer solution 0.63 mg, 0.63 mg, Nebulization, Q6H, Sheikh, Omair  Latif, DO, 0.63 mg at 11/16/18 2019 .  LORazepam (ATIVAN) injection 0.5 mg, 0.5 mg, Intravenous, Q6H PRN, Raiford Noble Latif, DO, 0.5 mg at 11/16/18 1737 .  morphine 2 MG/ML injection 1 mg, 1 mg, Intravenous, Q4H PRN, Hugelmeyer, Alexis, DO, 1 mg at 11/15/18 1543 .  ondansetron (ZOFRAN) tablet 4 mg, 4 mg, Oral, Q6H PRN **OR** ondansetron (ZOFRAN) injection 4 mg, 4 mg, Intravenous, Q6H PRN, Hugelmeyer, Alexis, DO .  oxyCODONE (Oxy IR/ROXICODONE) immediate release tablet 2.5 mg, 2.5 mg, Oral, Q4H PRN, Hugelmeyer, Alexis, DO, 2.5 mg at 11/16/18 1201 .  polyethylene glycol (MIRALAX / GLYCOLAX) packet 17 g, 17 g, Oral, Daily PRN, Hugelmeyer, Alexis, DO .  senna-docusate (Senokot-S) tablet 1 tablet, 1 tablet, Oral, QHS PRN, Hugelmeyer, Alexis, DO  Home Medications  Prior to Admission medications   Medication Sig Start Date End Date Taking? Authorizing Provider  anastrozole (ARIMIDEX) 1 MG tablet Take 1 tablet (1 mg total) by mouth daily. 10/31/18  Yes Meccariello, Bernita Raisin, DO  clotrimazole (LOTRIMIN) 1 % cream Apply topically 2 (two) times daily. For 2-4 weeks. 10/30/18  Yes Meccariello, Bernita Raisin, DO  furosemide (LASIX) 20 MG tablet Take 1 tablet (20 mg total) by mouth daily. 11/10/18  Yes [name], Virgie Dad, MD  oxyCODONE (OXY IR/ROXICODONE) 5 MG immediate release tablet Take 0.5 tablets (2.5 mg total) by mouth every 4 (four) hours as needed for moderate pain or severe pain. 11/06/18  Yes [name], Virgie Dad, MD  polyethylene glycol (MIRALAX / GLYCOLAX) 17 g packet Take 17 g by mouth as needed for constipation.   Yes [provider]  palbociclib (IBRANCE) 125 MG capsule Take 1 capsule (125 mg total) by mouth daily with breakfast. Take whole with food. Take for 21 days on, 7 days off, repeat every 28 days. 11/06/18   [name], Virgie Dad, MD      Critical care time: 60 minutes.  The treatment and management of the patient's condition was required based on the threat of imminent deterioration. This  time reflects time spent by the physician evaluating, providing care and managing the critically ill patient's care. The time was spent at the immediate bedside (or on the same floor/unit and dedicated to this patient's care). Time  involved in separately billable procedures is NOT included int he critical care time indicated above. Family meeting and update time may be included above if and only if the patient is unable/incompetent to participate in clinical interview and/or decision making, and the discussion was necessary to determining treatment decisions.    Renee Pain, MD Board Certified by the ABIM, [place] Pager: [phone]

## 2018-11-16 NOTE — Progress Notes (Signed)
Patient refusing/not tolerating BiPAP at this time. Patient requesting to speak to daughter.

## 2018-11-16 NOTE — Progress Notes (Signed)
OT Cancellation Note  Patient Details Name: Suzanne [name] MRN: [id number] DOB: March 27, 1961   Cancelled Treatment:    Reason Eval/Treat Not Completed: Medical issues which prohibited therapy. Checked on pt a couple of times this am.  HR in the 120s at rest. Will check back tomorrow.  [place] 11/16/2018, 1:14 PM  Lesle Chris, OTR/L Acute Rehabilitation Services [phone] [facility] pager [phone] office 11/16/2018

## 2018-11-16 NOTE — Progress Notes (Addendum)
Initial Nutrition Assessment  RD working remotely.   DOCUMENTATION CODES:   Obesity unspecified  INTERVENTION:  - continue Ensure Enlive BID, each supplement provides 350 kcal and 20 grams of protein. - continue to encourage PO intakes.    NUTRITION DIAGNOSIS:   Increased nutrient needs related to chronic illness, cancer and cancer related treatments as evidenced by estimated needs.  GOAL:   Patient will meet greater than or equal to 90% of their needs  MONITOR:   PO intake, Supplement acceptance, Labs, Weight trends, I & O's  REASON FOR ASSESSMENT:   Malnutrition Screening Tool, Consult Assessment of nutrition requirement/status  ASSESSMENT:   58 year-old female with a known history of inflammatory breast cancer with mets to lungs, lymph nodes, and bone, complicated by bilateral malignant effusions and anemia. She presented to the ED  for evaluation of SOB. Patient underwent blood transfusion 6/19 and subsequently had SOB, DOE and orthopnea associated with cough. She has a thoracentesis of right then left lungs on 6/2 and 6/3 and was scheduled for a repeat.  Patient was able to consume most of breakfast this AM. She reports decreased appetite and intakes PTA d/t generally feeling unwell d/t cancer dx. She denies N/V, abdominal pain/pressure with or without PO intakes, no taste changes, no diarrhea or constipation PTA. Noted that patient had 2 thoracenteses recently (6/2 and 6/3)--unsure how much fluid was removed on these dates. She underwent thoracentesis 6/20 with 400 ml dark red blood removed. Per chart review, current weight is 222 lb and weight on 6/4 was 234 lb. This indicates 12 lb weight loss (5% body weight) in the past 2.5 weeks; significant for time frame. Ensure Enlive ordered BID yesterday and patient accepted both bottles offered.   Per notes: acute respiratory failure with hypoxia in the setting of recurrent L-sided pleural effusion and possible CHF exacerbation,  volume overload, hypokalemia--resolved after repletion, macrocytic anemia, metastatic breast cancer   Medications reviewed; 40 mg IV lasix BID, 500 mg KPhos neutral x1 dose 6/21. Labs reviewed; Cl: 94 mmol/l, Ca: 5.8 mg/dl, Phos: 2.4 mg/dl.      NUTRITION - FOCUSED PHYSICAL EXAM:  unable to complete at this time.   Diet Order:   Diet Order            Diet Heart Room service appropriate? Yes; Fluid consistency: Thin  Diet effective now              EDUCATION NEEDS:   No education needs have been identified at this time  Skin:  Skin Assessment: Reviewed RN Assessment  Last BM:  6/21  Height:   Ht Readings from Last 1 Encounters:  11/14/18 5\' 2"  (1.575 m)    Weight:   Wt Readings from Last 1 Encounters:  11/16/18 100.6 kg    Ideal Body Weight:  50 kg  BMI:  Body mass index is 40.56 kg/m.  Estimated Nutritional Needs:   Kcal:  1601-0932 kcal  Protein:  120-130 grams  Fluid:  >/= 2 L/day      Jarome Matin, MS, RD, LDN, [facility] Inpatient Clinical Dietitian Pager # [phone] After hours/weekend pager # [phone]

## 2018-11-16 NOTE — Progress Notes (Signed)
eLink Physician-Brief Progress Note Patient Name: Suzanne [name] DOB: April 09, 1961 MRN: [id number]   Date of Service  11/16/2018  HPI/Events of Note  Notified by bedside ICU RN that patient changed code status to full from DNR.  This was confirmed with my discussion with the patient. She seems tachypneic on video assessment.ABG done showed adequate ventilation and oxygenation. Pt also given Lasix 93mh IV prior to transfer to the ICU.  eICU Interventions  Trial of BPAP 12/5 for work of breathing.  Monitor I&Os.  If BP tolerates, can give another dose of Lasix tonight.     Intervention Category Intermediate Interventions: Respiratory distress - evaluation and management  Elsie Lincoln 11/16/2018, 8:09 PM

## 2018-11-16 NOTE — Progress Notes (Signed)
  Checked on patient  Confirm she does not want to be on a BiPAP Confirmed that she does not want to be intubated  Confirmed DNR status

## 2018-11-16 NOTE — Progress Notes (Signed)
Patient with abdominal breathing, unresolved with anxiety and deep breathing exercises. Dr. Alfredia Ferguson paged and in to assess patient. Given orders for SDU.

## 2018-11-16 NOTE — Progress Notes (Signed)
PT Cancellation Note  Patient Details Name: [name] MRN: [id number] DOB: 1960/11/24   Cancelled Treatment:    Reason Eval/Treat Not Completed: Medical issues which prohibited therapy   Weston Anna, PT Acute Rehabilitation Services Pager: [phone] Office: [phone]

## 2018-11-16 NOTE — Progress Notes (Addendum)
PROGRESS NOTE    Suzanne [name]  CZY:[id number] DOB: [birth year] DOA: 11/14/2018 PCP: Patient, No Pcp Per   Brief Narrative:  HPI per Dr. Harvie Bridge on 11/14/2018 HPI: Suzanne [name] is a 58 y.o. 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  Patient underwent blood transfusion yesterday and subsequently had SOB, DOE and orthopnea assoicated with cough.  She has a thoracentesis of right then left lungs on 6/2 and 6/3 and was scheduled for a repeat 3 days   Patient denies fevers/chills, weakness, dizziness, chest pain, N/V/C/D, abdominal pain, dysuria/frequency, changes in mental status.    EMS/ED Course:  Medical admission has been requested for further management of recurrent pleural effusion.  **Interim History  Patient underwent successful thoracentesis and removed 400 mL's of dark red fluid.  Pete echocardiogram attempted however unable to be done due to patient's tachycardic heart rate.  Today patient respiratory status worsened and she is started on diuresis however it continues to worsen so she was placed on BiPAP and transferred to the stepdown unit.  Patient was also started on antibiotics with cefepime given her fever this morning and worsening shortness of breath or for pneumonia.  Nutrition is consulted and recommending Ensure Enlive twice daily and continuing to encourage p.o. intakes  Assessment & Plan:   Active Problems:   Pleural effusion  Acute respiratory failure with hypoxia in the setting of recurrent left-sided pleural effusion and possibly suspected diastolic CHF exacerbation now requiring noninvasive positive pressure ventilation with BiPAP -Continue supplemental oxygen via nasal cannula and wean O2 as tolerated but she acutely decompensated and required BiPAP and transferred to the stepdown  unit -Continuous pulse oximetry and maintain O2 saturation over 90% -We will start diuresis with IV 40 twice daily and thoracentesis was done yesterday next-diuresis is been increased from 40 IV twice daily to 80 mg twice daily -Continue with DuoNeb 3 mL every 6 PRN for wheezing or shortness of breath -Started the patient on Xopenex/Atrovent scheduled and also started budesonide and Brovana -Continue monitor respiratory status and repeat chest x-ray in a.m. -Repeat chest x-ray this morning showed stable low lung volumes and small bilateral pleural effusions along with a stable thick irregular reticular opacity in the upper lobes and patchy left lung base consolidation likely representing a combination of lymphatic genic tumor and atelectasis. -Will need home ambulatory screen prior to discharge -May need Pleurx catheter but will need to discuss with medical oncology Dr. Jana Hakim -Continue with diuresis as above -We will need PT OT to evaluate and treat -Continue to monitor respiratory status carefully and repeat chest x-ray in the a.m. -May need a CT scan of the  chest without contrast **Addendum 18:45: Patient was refusing to wear the BiPAP and wanted to speak with her daughter.  I spoke with her daughter and the daughter states that she will speak to the mother about wearing BiPAP.  I have also called pulmonary Dr. Ander Slade who will have his partner evaluate the patient in case the patient needs to be intubated and follow-up for her respiratory distress  Recurrent L Pleural Effusion -Admitted to Inpatient Telemetry -CXR on 11/14/2018 showed "Stable radiographic appearance of the chest since yesterday. A left pleural effusion and left basilar pulmonary opacity have increased from older prior studies. Evidence of extensive metastatic disease again noted." -IR consulted for Repeat Thoracentesis and removed 400  and mils of red fluid -Check INR daily -May need Pleurx catheter for further drainage and  will need to discuss with oncology  Volume overload in the setting of blood transfusion versus suspected Diastolic CHF -Continue Telemetry Monitoring  -Given blood transfusion -Given IV Lasix the first day she was here and started on 40 mg twice daily but now increased to 80 twice daily and placed on BiPAP given worsening respiratory status -Strict I's/O's and Daily Weights -Last echocardiogram done on 10/27/2018 showed the left ventricle has normal systolic function with a EF of 60 to 35% however left ventricular diastolic Doppler parameters were indeterminate and limited due to acoustic windows not being able to be seen due to patient's body habitus -We will recount a repeat limited echo to assess for Diastolic Dysfunction = Patient is +10 mL's since admission and is apparently down 12 pounds but question if this is accurate -Continue to monitor for signs and symptoms of volume overload  Hypokalemia -Mild at 3.2 on admission is now 3.7 -Continue to Monitor and Replete as Necessary -Repeat CMP in AM   Macrocytic Anemia -Patient's Hb/Hct went from 9.5/30.7 -> 9.9/31.7 -> 9.0/28.9 during his hospitalization but the day before yesterday hemoglobin/hematocrit was 6.6 and was transfused 2 units of PRBCs -Check anemia panel in a.m. 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 Gay Filler -Patient was also started Ibrance but will hold currently and allow oncology to weigh in; patient: Oncology records was to start Palbociclib 1.5 mg p.o. daily when she received it -Also was started on Xgeva yesterday due to bone metastasis -Continue anastrozole 1 mg p.o. daily -Pain control with oxycodone IR 2.5 mg every 4 PRN for moderate and severe pain and then IV morphine 1 mg every 4 as needed for severe  pain -Continue with antiemetics -Bowel regimen with bisacodyl 5 mg p.o. daily as needed as well as MiraLAX 17 g p.o. daily PRN for moderate constipation along with senna docusate 1 tab p.o. nightly PRN for mild constipation  Hypocalcemia -Patient's Ca2+ this AM was 5.8 -Corrected Calicum was 7.5 -Check Ionized Ca2+ In AM  -Give 1 gram of IV Calcium Gluconate again  -Continue to Monitor and Replete as Necessary -Repeat CMP in AM   Obesity -Estimated body mass index is 40.56 kg/m as calculated from the following:   Height as of this encounter: 5\' 2"  (1.575 m).   Weight as of this encounter: 100.6 kg.  -Weight loss and dietary counseling given  Hypophosphatemia -Patient's phosphorus levels morning was 2.4 -Replete with p.o. K-Phos Neutral 500 mg x 1 -Continue to Monitor and replete as Necessary -Repeat phosphorus level in a.m.  DVT prophylaxis: Heparin 5000 units subcu q8h Code Status: FULL CODE  Family Communication: No family present at bedside  Disposition Plan: Remain Inpatient for continued work up and treatment  Consultants:   Interventional Radiology   Medical oncology   Procedures:  Consulted IR for Thoracentesis   Antimicrobials: Anti-infectives (From admission, onward)   Start     Dose/Rate Route Frequency Ordered Stop   11/16/18 0900  ceFEPIme (MAXIPIME) 1 g in sodium chloride 0.9 % 100 mL IVPB     1 g 200 mL/hr over 30 Minutes Intravenous Every 8 hours 11/16/18 0815       Subjective: Seen and examined at bedside this AM and  she was sitting in the chair and doing okay but her heart rate was fast in the morning.  Later on in the evening she became more dyspneic and anxious and had a little bit more labored breathing so she will be transferred to stepdown unit for BiPAP.  She felt short of breath last night and states that she did not have a good night at all.  Denies chest pain but states that she cannot lay flat and has been sitting in the chair.  Denies any  other concerns otherwise at this time.  Objective: Vitals:   11/16/18 1005 11/16/18 1011 11/16/18 1309 11/16/18 1751  BP:   (!) 123/95 135/63  Pulse:   (!) 123 (!) 109  Resp:   (!) 24 (!) 30  Temp:   98.5 F (36.9 C) 99.1 F (37.3 C)  TempSrc:   Oral Oral  SpO2: 95% 95% 93% 98%  Weight:      Height:        Intake/Output Summary (Last 24 hours) at 11/16/2018 1817 Last data filed at 11/16/2018 0900 Gross per 24 hour  Intake 720 ml  Output 950 ml  Net -230 ml   Filed Weights   11/14/18 1421 11/14/18 2100 11/16/18 0627  Weight: 106 kg 101.2 kg 100.6 kg   Examination: Physical Exam:  Constitutional: Well-nourished, well-developed obese Caucasian female currently in some respiratory distress and appears uncomfortable sitting on a chair at bedside breathing 30 times a minute Eyes: Lids and conjunctive are normal.  Sclera icteric ENMT: External ears nose appear normal.  Grossly normal hearing.  Mucous murmurs moist Neck: Supple and JVD is difficult to assess Respiratory: Diminished auscultation bilaterally no appreciable wheezing but does have coarse breath sounds and coarse lung sounds on the left compared to right.  Did have some diffuse crackles.  Had a increased respiratory effort and she is tachypneic and had a little bit labored breathing on supplemental oxygen via nasal cannula Cardiovascular: Tachycardic rate but regular rhythm and she has distant heart sounds due to her body habitus.  No appreciable murmurs, rubs, gallops.  Has 1+ to 2+ lower extremity edema Abdomen: Soft, nontender, distended second body habitus.  Bowel sounds present GU: Deferred Musculoskeletal: No contractures or cyanosis.  No joint deformities in the upper and lower extremities Skin: No appreciable rashes or lesions limited skin evaluation but does have some skin discoloration of the lower extremities Neurologic: Cranial nerves II through XII gross intact no appreciable focal deficits.  Romberg sign  cerebellar reflexes were not assessed Psychiatric: She is anxious but has an appropriate affect.  She has normal judgment and insight.  She was awake and alert, oriented  Data Reviewed: I have personally reviewed following labs and imaging studies  CBC: Recent Labs  Lab 11/13/18 0941 11/14/18 1620 11/15/18 0543 11/16/18 0502  WBC 9.3 7.7 8.3 9.3  NEUTROABS 6.0 4.9  --  6.6  HGB 6.6* 9.5* 9.9* 9.0*  HCT 20.8* 30.7* 31.7* 28.9*  MCV 99.0 100.0 101.0* 100.0  PLT 145* 131* 135* PLATELET CLUMPS NOTED ON SMEAR, UNABLE TO ESTIMATE   Basic Metabolic Panel: Recent Labs  Lab 11/13/18 0941 11/14/18 1620 11/14/18 1621 11/15/18 0543 11/16/18 0502  NA 133* 136  --  135 135  K 3.3* 3.2*  --  3.5 3.7  CL 93* 95*  --  96* 94*  CO2 31 31  --  30 29  GLUCOSE 135* 119*  --  155* 119*  BUN 8 9  --  8  10  CREATININE 0.71 0.73  --  0.65 0.66  CALCIUM 7.6* 6.8*  --  6.4* 5.8*  MG  --   --  2.4  --  1.9  PHOS  --   --  2.5  --  2.4*   GFR: Estimated Creatinine Clearance: 84 mL/min (by C-G formula based on SCr of 0.66 mg/dL). Liver Function Tests: Recent Labs  Lab 11/13/18 0941 11/14/18 1620 11/15/18 0543 11/16/18 0502  AST 23 26 36 31  ALT 14 17 22 18   ALKPHOS 329* 292* 295* 268*  BILITOT 0.8 0.7 0.5 0.8  PROT 6.3* 6.3* 6.3* 5.9*  ALBUMIN 1.9* 2.2* 2.1* 1.9*   No results for input(s): LIPASE, AMYLASE in the last 168 hours. No results for input(s): AMMONIA in the last 168 hours. Coagulation Profile: Recent Labs  Lab 11/15/18 0543  INR 1.2   Cardiac Enzymes: No results for input(s): CKTOTAL, CKMB, CKMBINDEX, TROPONINI in the last 168 hours. BNP (last 3 results) No results for input(s): PROBNP in the last 8760 hours. HbA1C: No results for input(s): HGBA1C in the last 72 hours. CBG: No results for input(s): GLUCAP in the last 168 hours. Lipid Profile: No results for input(s): CHOL, HDL, LDLCALC, TRIG, CHOLHDL, LDLDIRECT in the last 72 hours. Thyroid Function Tests: Recent  Labs    11/14/18 1620  TSH 4.251   Anemia Panel: No results for input(s): VITAMINB12, FOLATE, FERRITIN, TIBC, IRON, RETICCTPCT in the last 72 hours. Sepsis Labs: Recent Labs  Lab 11/16/18 0910 11/16/18 1158  PROCALCITON 1.02  --   LATICACIDVEN 1.5 1.3    Recent Results (from the past 240 hour(s))  SARS Coronavirus 2 (CEPHEID - Performed in [facility] hospital lab), Hosp Order     Status: None   Collection Time: 11/14/18  4:34 PM   Specimen: Nasopharyngeal Swab  Result Value Ref Range Status   SARS Coronavirus 2 NEGATIVE NEGATIVE Final    Comment: (NOTE) If result is NEGATIVE SARS-CoV-2 target nucleic acids are NOT DETECTED. The SARS-CoV-2 RNA is generally detectable in upper and lower  respiratory specimens during the acute phase of infection. The lowest  concentration of SARS-CoV-2 viral copies this assay can detect is 250  copies / mL. A negative result does not preclude SARS-CoV-2 infection  and should not be used as the sole basis for treatment or other  patient management decisions.  A negative result may occur with  improper specimen collection / handling, submission of specimen other  than nasopharyngeal swab, presence of viral mutation(s) within the  areas targeted by this assay, and inadequate number of viral copies  (<250 copies / mL). A negative result must be combined with clinical  observations, patient history, and epidemiological information. If result is POSITIVE SARS-CoV-2 target nucleic acids are DETECTED. The SARS-CoV-2 RNA is generally detectable in upper and lower  respiratory specimens dur ing the acute phase of infection.  Positive  results are indicative of active infection with SARS-CoV-2.  Clinical  correlation with patient history and other diagnostic information is  necessary to determine patient infection status.  Positive results do  not rule out bacterial infection or co-infection with other viruses. If result is PRESUMPTIVE  POSTIVE SARS-CoV-2 nucleic acids MAY BE PRESENT.   A presumptive positive result was obtained on the submitted specimen  and confirmed on repeat testing.  While 2019 novel coronavirus  (SARS-CoV-2) nucleic acids may be present in the submitted sample  additional confirmatory testing may be necessary for epidemiological  and / or clinical  management purposes  to differentiate between  SARS-CoV-2 and other Sarbecovirus currently known to infect humans.  If clinically indicated additional testing with an alternate test  methodology [phone]) is advised. The SARS-CoV-2 RNA is generally  detectable in upper and lower respiratory sp ecimens during the acute  phase of infection. The expected result is Negative. Fact Sheet for Patients:  StrictlyIdeas.no Fact Sheet for Healthcare Providers: BankingDealers.co.za This test is not yet approved or cleared by the [place] FDA and has been authorized for detection and/or diagnosis of SARS-CoV-2 by FDA under an Emergency Use Authorization (EUA).  This EUA will remain in effect (meaning this test can be used) for the duration of the COVID-19 declaration under Section 564(b)(1) of the Act, 21 U.S.C. section 360bbb-3(b)(1), unless the authorization is terminated or revoked sooner. Performed at [facility], [place] [address] [place], [place] [address]   MRSA PCR Screening     Status: None   Collection Time: 11/16/18  8:02 AM   Specimen: Nasal Mucosa; Nasopharyngeal  Result Value Ref Range Status   MRSA by PCR NEGATIVE NEGATIVE Final    Comment:        The GeneXpert MRSA Assay (FDA approved for NASAL specimens only), is one component of a comprehensive MRSA colonization surveillance program. It is not intended to diagnose MRSA infection nor to guide or monitor treatment for MRSA infections. Performed at [facility], [place] [address], [place], [place]  [address]     Radiology Studies: Dg Chest 1 View  Result Date: 11/15/2018 CLINICAL DATA:  Post left-sided thoracentesis. EXAM: CHEST  1 VIEW COMPARISON:  11/14/2018 FINDINGS: Lungs are hypoinflated demonstrate interval improvement with moderate residual left-sided pleural effusion likely with associated basilar atelectasis. No pneumothorax. Mild prominence of the perihilar markings without significant change. Cardiomediastinal silhouette and remainder of the exam is unchanged. IMPRESSION: Hypoinflation with interval improvement in left pleural effusion post thoracentesis. No pneumothorax. Moderate residual left effusion likely with associated basilar atelectasis. Prominence of the perihilar markings suggesting vascular congestion. Electronically Signed   By: Marin Olp M.D.   On: 11/15/2018 13:58   Dg Chest Port 1 View  Result Date: 11/16/2018 CLINICAL DATA:  Dyspnea, metastatic breast cancer EXAM: PORTABLE CHEST 1 VIEW COMPARISON:  Chest radiograph from one day prior. FINDINGS: Low lung volumes. Stable cardiomediastinal silhouette with mild cardiomegaly. No pneumothorax. Stable small bilateral pleural effusions. Stable patchy left lung base consolidation. Stable thick irregular reticular opacities in the upper lungs. IMPRESSION: 1. Stable low lung volumes with small bilateral pleural effusions. 2. Stable thick irregular reticular opacities in the upper lungs and patchy left lung base consolidation. Findings likely representing combination of lymphangitic tumor (as seen on recent chest CT) and atelectasis. A component of mild pulmonary edema cannot be excluded. Electronically Signed   By: Ilona Sorrel M.D.   On: 11/16/2018 07:13   US Thoracentesis Asp Pleural Space W/img Guide  Result Date: 11/15/2018 INDICATION: Patient with history of metastatic breast cancer, dyspnea, bilateral pleural effusions L>R. Request is made for therapeutic left thoracentesis. EXAM: ULTRASOUND GUIDED THERAPEUTIC LEFT  THORACENTESIS MEDICATIONS: 10 mL 1% lidocaine COMPLICATIONS: None immediate. PROCEDURE: An ultrasound guided thoracentesis was thoroughly discussed with the patient and questions answered. The benefits, risks, alternatives and complications were also discussed. The patient understands and wishes to proceed with the procedure. Written consent was obtained. Ultrasound was performed to localize and mark an adequate pocket of fluid in the left chest. The area was then prepped and draped in the  normal sterile fashion. 1% Lidocaine was used for local anesthesia. Under ultrasound guidance a 6 Fr Safe-T-Centesis catheter was introduced. Thoracentesis was performed. The catheter was removed and a dressing applied. FINDINGS: A total of approximately 400 mL of dark red fluid was removed. IMPRESSION: Successful ultrasound guided left thoracentesis yielding 400 mL of pleural fluid. Read by: Earley Abide, PA-C Electronically Signed   By: Markus Daft M.D.   On: 11/15/2018 14:32   Scheduled Meds:  anastrozole  1 mg Oral Daily   arformoterol  15 mcg Nebulization BID   budesonide (PULMICORT) nebulizer solution  0.25 mg Nebulization BID   feeding supplement (ENSURE ENLIVE)  237 mL Oral BID BM   furosemide  80 mg Intravenous BID   heparin  5,000 Units Subcutaneous Q8H   ipratropium  0.5 mg Nebulization Q6H   levalbuterol  0.63 mg Nebulization Q6H   Continuous Infusions:  sodium chloride 250 mL (11/16/18 0842)   ceFEPime (MAXIPIME) IV 1 g (11/16/18 1148)    LOS: 2 days   Kerney Elbe, DO [facility] PAGER is on AMION  If 7PM-7AM, please contact night-coverage [email or web address] Password [facility] 11/16/2018, 6:17 PM

## 2018-11-16 NOTE — Progress Notes (Signed)
Spoke with daughter, [name], regarding the need for her mother to be transferred to our SDU for further respiratory treatment. Provided her with room number, and phone number to unit. Advised daughter that patient was being checked in and set up, she is able to call for update in about 35-45 mins. Daughter understood.

## 2018-11-16 NOTE — Progress Notes (Signed)
CRITICAL VALUE ALERT  Critical Value: Calcium 5.8  Date & Time Notied: 11/16/18 @ 7939  Provider Notified: Yes, B[place]heimer  Orders Received/Actions taken: none awaiting response

## 2018-11-16 NOTE — Progress Notes (Signed)
Notified Dr. Alfredia Ferguson of unable to provide strict output for patient, as she is having incontinent episodes of stool due to Mag Citrate, and unable to use external catheter currently. Also HR is ST 120s-130s. Orders obtained.

## 2018-11-17 ENCOUNTER — Inpatient Hospital Stay (HOSPITAL_COMMUNITY): Payer: Medicaid Other

## 2018-11-17 DIAGNOSIS — C779 Secondary and unspecified malignant neoplasm of lymph node, unspecified: Secondary | ICD-10-CM

## 2018-11-17 DIAGNOSIS — J90 Pleural effusion, not elsewhere classified: Secondary | ICD-10-CM

## 2018-11-17 DIAGNOSIS — C7951 Secondary malignant neoplasm of bone: Secondary | ICD-10-CM

## 2018-11-17 DIAGNOSIS — Z170 Estrogen receptor positive status [ER+]: Secondary | ICD-10-CM

## 2018-11-17 DIAGNOSIS — E8809 Other disorders of plasma-protein metabolism, not elsewhere classified: Secondary | ICD-10-CM

## 2018-11-17 DIAGNOSIS — C50911 Malignant neoplasm of unspecified site of right female breast: Secondary | ICD-10-CM

## 2018-11-17 DIAGNOSIS — J9621 Acute and chronic respiratory failure with hypoxia: Principal | ICD-10-CM

## 2018-11-17 DIAGNOSIS — R0602 Shortness of breath: Secondary | ICD-10-CM

## 2018-11-17 DIAGNOSIS — C7801 Secondary malignant neoplasm of right lung: Secondary | ICD-10-CM

## 2018-11-17 DIAGNOSIS — C7802 Secondary malignant neoplasm of left lung: Secondary | ICD-10-CM

## 2018-11-17 DIAGNOSIS — Z6841 Body Mass Index (BMI) 40.0 and over, adult: Secondary | ICD-10-CM

## 2018-11-17 DIAGNOSIS — M7989 Other specified soft tissue disorders: Secondary | ICD-10-CM

## 2018-11-17 LAB — CBC WITH DIFFERENTIAL/PLATELET
Abs Immature Granulocytes: 0.12 10*3/uL — ABNORMAL HIGH (ref 0.00–0.07)
Basophils Absolute: 0 10*3/uL (ref 0.0–0.1)
Basophils Relative: 0 %
Eosinophils Absolute: 0 10*3/uL (ref 0.0–0.5)
Eosinophils Relative: 0 %
HCT: 27.3 % — ABNORMAL LOW (ref 36.0–46.0)
Hemoglobin: 8.5 g/dL — ABNORMAL LOW (ref 12.0–15.0)
Immature Granulocytes: 2 %
Lymphocytes Relative: 23 %
Lymphs Abs: 1.8 10*3/uL (ref 0.7–4.0)
MCH: 32 pg (ref 26.0–34.0)
MCHC: 31.1 g/dL (ref 30.0–36.0)
MCV: 102.6 fL — ABNORMAL HIGH (ref 80.0–100.0)
Monocytes Absolute: 0.4 10*3/uL (ref 0.1–1.0)
Monocytes Relative: 5 %
Neutro Abs: 5.4 10*3/uL (ref 1.7–7.7)
Neutrophils Relative %: 70 %
Platelets: 101 10*3/uL — ABNORMAL LOW (ref 150–400)
RBC: 2.66 MIL/uL — ABNORMAL LOW (ref 3.87–5.11)
RDW: 18 % — ABNORMAL HIGH (ref 11.5–15.5)
WBC: 7.7 10*3/uL (ref 4.0–10.5)
nRBC: 0.8 % — ABNORMAL HIGH (ref 0.0–0.2)

## 2018-11-17 LAB — COMPREHENSIVE METABOLIC PANEL
ALT: 16 U/L (ref 0–44)
AST: 22 U/L (ref 15–41)
Albumin: 2.3 g/dL — ABNORMAL LOW (ref 3.5–5.0)
Alkaline Phosphatase: 208 U/L — ABNORMAL HIGH (ref 38–126)
Anion gap: 14 (ref 5–15)
BUN: 15 mg/dL (ref 6–20)
CO2: 30 mmol/L (ref 22–32)
Calcium: 5.2 mg/dL — CL (ref 8.9–10.3)
Chloride: 93 mmol/L — ABNORMAL LOW (ref 98–111)
Creatinine, Ser: 0.66 mg/dL (ref 0.44–1.00)
GFR calc Af Amer: >60 mL/min (ref >60)
GFR calc non Af Amer: >60 mL/min (ref >60)
Glucose, Bld: 117 mg/dL — ABNORMAL HIGH (ref 70–99)
Potassium: 2.8 mmol/L — ABNORMAL LOW (ref 3.5–5.1)
Sodium: 137 mmol/L (ref 135–145)
Total Bilirubin: 0.9 mg/dL (ref 0.3–1.2)
Total Protein: 6.2 g/dL — ABNORMAL LOW (ref 6.5–8.1)

## 2018-11-17 LAB — PHOSPHORUS: Phosphorus: 2.3 mg/dL — ABNORMAL LOW (ref 2.5–4.6)

## 2018-11-17 LAB — PROCALCITONIN: Procalcitonin: 2.3 ng/mL

## 2018-11-17 LAB — PREALBUMIN: Prealbumin: <5 mg/dL — ABNORMAL LOW (ref 18–38)

## 2018-11-17 LAB — MAGNESIUM: Magnesium: 2.2 mg/dL (ref 1.7–2.4)

## 2018-11-17 MED ORDER — CALCIUM GLUCONATE-NACL 1-0.675 GM/50ML-% IV SOLN
1.0000 g | Freq: Once | INTRAVENOUS | Status: AC
  Administered 2018-11-17: 1000 mg via INTRAVENOUS
  Filled 2018-11-17: qty 50

## 2018-11-17 MED ORDER — BISACODYL 5 MG PO TBEC: 5.0000 mg | DELAYED_RELEASE_TABLET | Freq: Every day | ORAL | Status: DC | PRN

## 2018-11-17 MED ORDER — ORAL CARE MOUTH RINSE
15.0000 mL | Freq: Two times a day (BID) | OROMUCOSAL | Status: DC
  Administered 2018-11-17 – 2018-11-30 (×19): 15 mL via OROMUCOSAL

## 2018-11-17 MED ORDER — POTASSIUM CHLORIDE CRYS ER 20 MEQ PO TBCR: 40.0000 meq | EXTENDED_RELEASE_TABLET | Freq: Two times a day (BID) | ORAL | Status: DC

## 2018-11-17 MED ORDER — CHLORHEXIDINE GLUCONATE CLOTH 2 % EX PADS
6.0000 | MEDICATED_PAD | Freq: Every day | CUTANEOUS | Status: DC
  Administered 2018-11-17 – 2018-11-24 (×8): 6 via TOPICAL

## 2018-11-17 MED ORDER — K PHOS MONO-SOD PHOS DI & MONO 155-852-130 MG PO TABS
500.0000 mg | ORAL_TABLET | Freq: Once | ORAL | Status: AC
  Administered 2018-11-17: 500 mg via ORAL
  Filled 2018-11-17: qty 2

## 2018-11-17 MED ORDER — CALCIUM CARBONATE-VITAMIN D 500-200 MG-UNIT PO TABS
1.0000 | ORAL_TABLET | Freq: Two times a day (BID) | ORAL | Status: DC
  Administered 2018-11-17 – 2018-11-19 (×5): 1 via ORAL
  Filled 2018-11-17 (×5): qty 1

## 2018-11-17 MED ORDER — POTASSIUM CHLORIDE CRYS ER 20 MEQ PO TBCR
40.0000 meq | EXTENDED_RELEASE_TABLET | Freq: Once | ORAL | Status: AC
  Administered 2018-11-17: 40 meq via ORAL
  Filled 2018-11-17: qty 2

## 2018-11-17 MED ORDER — POTASSIUM CHLORIDE CRYS ER 20 MEQ PO TBCR
30.0000 meq | EXTENDED_RELEASE_TABLET | Freq: Two times a day (BID) | ORAL | Status: AC
  Administered 2018-11-17 (×2): 30 meq via ORAL
  Filled 2018-11-17 (×2): qty 1

## 2018-11-17 NOTE — Progress Notes (Signed)
PT Cancellation Note  Patient Details Name: Suzanne [name] MRN: [id number] DOB: 12/27/1960   Cancelled Treatment:    Reason Eval/Treat Not Completed: Medical issues which prohibited therapy RN reports pt medicated with Ativan and having shortness of breath.  RN requests PT to check back tomorrow.  PT to check back as schedule permits.   Kelie Gainey,KATHrine E 11/17/2018, 2:31 PM Carmelia Bake, PT, DPT Acute Rehabilitation Services Office: [phone] Pager: [phone]

## 2018-11-17 NOTE — Progress Notes (Signed)
PROGRESS NOTE    Suzanne [name]  YYT:[id number] DOB: [birth year] DOA: 11/14/2018 PCP: Patient, No Pcp Per   Brief Narrative:  HPI per Dr. Harvie Bridge on 11/14/2018 HPI: Suzanne [name] is a 58 y.o. 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  Patient underwent blood transfusion yesterday and subsequently had SOB, DOE and orthopnea assoicated with cough.  She has a thoracentesis of right then left lungs on 6/2 and 6/3 and was scheduled for a repeat 3 days   Patient denies fevers/chills, weakness, dizziness, chest pain, N/V/C/D, abdominal pain, dysuria/frequency, changes in mental status.    EMS/ED Course:  Medical admission has been requested for further management of recurrent pleural effusion.  **Interim History  Patient underwent successful thoracentesis and removed 400 mL's of dark red fluid.  Repeat echocardiogram attempted however unable to be done due to patient's tachycardic heart rate.  Yesterday patient respiratory status worsened and she is started on diuresis however it continues to worsen so she was placed on BiPAP and transferred to the stepdown unit.  Patient was also started on antibiotics with cefepime given her fever this morning and worsening shortness of breath or for pneumonia.  Nutrition is consulted and recommending Ensure Enlive twice daily and continuing to encourage p.o. intakes.  Because of her tenuous respiratory status critical care was consulted for further evaluation recommendations and patient told the critical care doctor that she initially wanted to be DNR but now she is changed her mind she is full code.  Will need to continue diuresis and monitoring her respiratory status.  Patient is intermittently on BiPAP and now refusing it.  We will continue diuresis and BiPAP as needed and she may need a  Pleurx catheter if fluid accumulates.  Palliative care is consulted for goals of care discussion and patient's oncologist was consulted as well  Assessment & Plan:   Active Problems:   Shortness of breath   Pleural effusion, bilateral   Morbid obesity with BMI of 40.0-44.9, adult (HCC)   Carcinoma of breast, estrogen receptor positive, stage 4, right (HCC)   Hypoalbuminemia  Acute respiratory failure with hypoxia in the setting of recurrent left-sided pleural effusion and possibly suspected diastolic CHF exacerbation now requiring noninvasive positive pressure ventilation with BiPAP; now off of the BiPAP -Continue supplemental oxygen via nasal cannula and wean O2 as tolerated but she acutely decompensated and required BiPAP and transferred to the stepdown unit -Continuous pulse oximetry and maintain O2 saturation over 90% -We will start diuresis with IV 40 twice daily and thoracentesis was done yesterday next-diuresis is been increased from 40 IV twice daily to 80 mg twice daily -Continue with DuoNeb 3 mL every 6 PRN for wheezing or shortness of breath -Started the patient on Xopenex/Atrovent scheduled and also started budesonide and Brovana -Continue monitor respiratory status and repeat chest x-ray in a.m. -Repeat chest x-ray this morning showed stable low lung volumes and small bilateral pleural effusions along with a stable thick irregular reticular opacity in the upper lobes and patchy left lung base consolidation likely representing a combination of lymphatic genic tumor and atelectasis. -Will need home ambulatory screen prior to discharge -May need Pleurx catheter but will need to discuss with medical oncology Dr. Jana Hakim -Continue with diuresis as above -We will need PT OT to evaluate and treat -Continue to monitor respiratory status carefully and repeat  chest x-ray in the a.m. -Pulmonary critical care consulted and they recommending BiPAP as needed as well as continue supplemental  oxygen to maintain O2 saturation greater than 90% -CT of the chest ordered by pulmonary as and showed wide metastatic disease to the bones redemonstrated with multiple nondisplaced minimally displaced bilateral rib fractures as well as new lightly displaced fracture of the medial aspect of the right clavicle with probable adjacent intramuscular hemorrhage into the lower right sternocleidomastoid and right upper pectoralis pectoralis major muscle.  This was probably a tumoral infiltration throughout the right breast as well as bilateral axillary and subpectoral lymphadenopathy and unusual appearance of the lung similar to prior study there would suggest chronic systemic disease such as sarcoidosis or interstitial lung disease and the possibility of lymphangitic spread of the tumor in the lungs could be considered and there is a moderate predominantly subpulmonic pleural effusion and right pleural effusion -We will have pulmonary evaluate her CT scan -Patient's oncologist has waiting and feels that her case is complex and because she has neglected her cancer and her health in general for many years her breast cancer is not curable but is treatable and is currently being treated with antiestrogens and and also Mab with CK D4, 6 inhibitors to be added later.  -Currently patient is wanting to leave and be discharged home but given her tenuous respiratory status she will bounce right back and will have palliative care weigh in for further goals of discussion care for patient.  If the patient decides on comfort care she may be a candidate for [facility] but will need further discussion.  Recurrent L Pleural Effusion and is now coming back again -Admitted to Inpatient Telemetry -CXR on 11/14/2018 showed "Stable radiographic appearance of the chest since yesterday. A left pleural effusion and left basilar pulmonary opacity have increased from older prior studies. Evidence of extensive metastatic disease again  noted." -IR consulted for Repeat Thoracentesis and removed 400 and mils of red fluid -Check INR daily -May need Pleurx catheter for further drainage and will need to discuss with oncology -I will have Dr. Jana Hakim decide about Pleurx catheter placement but I will discuss the case with them first  Volume overload in the setting of blood transfusion versus suspected Diastolic CHF -Continue Telemetry Monitoring  -Given blood transfusion -Given IV Lasix the first day she was here and started on 40 mg twice daily but now increased to 80 twice daily and placed on BiPAP given worsening respiratory status -Strict I's/O's and Daily Weights -Last echocardiogram done on 10/27/2018 showed the left ventricle has normal systolic function with a EF of 60 to 35% however left ventricular diastolic Doppler parameters were indeterminate and limited due to acoustic windows not being able to be seen due to patient's body habitus -We will recount a repeat limited echo to assess for Diastolic Dysfunction -Patient is -171.6 mL since admission -Continue to monitor for signs and symptoms of volume overload  Hypokalemia -Potassium is now 2.8 and will replete with p.o. potassium chloride 40 mEq along with 30 mg p.o. twice daily for 2 doses.  Patient also received K-Phos Neutral 500 mg x 1 -Continue to Monitor and Replete as Necessary -Repeat CMP in AM   Macrocytic Anemia -Patient's Hb/Hct went from 9.5/30.7 -> 9.9/31.7 -> 9.0/28.9 -> 8.5/27.3 -Was transfused 2 units of PRBCs prior to coming in  -Check anemia panel in a.m. 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 Lurline Del -Patient was also started Ibrance but will hold currently and allow oncology to weigh in; patient: Oncology records was to start Palbociclib 1.5  mg p.o. daily when she received it -Also was started on Xgeva yesterday due to bone metastasis -Continue anastrozole 1 mg p.o. daily -Pain control with oxycodone IR 2.5 mg every 4 PRN for moderate and severe pain and then IV morphine 1 mg every 4 as needed for severe pain -Continue with antiemetics -Bowel regimen with bisacodyl 5 mg p.o. daily as needed as well as MiraLAX 17 g p.o. daily PRN for moderate constipation along with senna docusate 1 tab p.o. nightly PRN for mild constipation -CT scan of the chest showed worsening metastatic disease and which was widespread.  Metastatic disease showed redemonstration to the bones along with multiple nondisplaced minimally displaced bilateral rib fractures along with a new mildly displaced fracture of the medial aspect of the right clavicle with probable additional intramuscular hemorrhage and right lower sternocleidomastoid muscle and the upper right pectoralis major muscle.  This was felt this was probable tumor oral infiltration throughout the right breast  Hypocalcemia -Patient's Ca2+ this AM was 5.2 -Check Ionized Ca2+ In AM  -Give 1 gram of IV Calcium Gluconate again today and may need a calcium gluconate drip -Continue to Monitor and Replete as Necessary -Repeat CMP in AM   Obesity -Estimated body mass index is 39.31 kg/m as calculated from the following:   Height as of this encounter: 5\' 2"  (1.575 m).   Weight as of this encounter: 97.5 kg.  -Weight loss and dietary counseling given  Hypophosphatemia -Patient's phosphorus levels morning was 2.3 -Replete with p.o. K-Phos Neutral 500 mg x 1 -Continue to Monitor and replete as Necessary -Repeat phosphorus level in a.m.  Bilateral lower extremity rash in the feet and the legs  -unclear etiology but appears to be some mild petechiae -We will continue monitor and follow and may need some corticosteroids  DVT prophylaxis: Heparin 5000 units subcu q8h Code Status: FULL CODE  Family  Communication: No family present at bedside discussed the case with the daughter yesterday over the phone Disposition Plan: Remain Inpatient for continued work up and treatment; patient wants to leave to go home but I do not feel it is safe for her until goals of care discussion is had  Consultants:   Interventional Radiology   Medical oncology  Pulmonary  Palliative care   Procedures:  Consulted IR for Thoracentesis   Antimicrobials: Anti-infectives (From admission, onward)   Start     Dose/Rate Route Frequency Ordered Stop   11/17/18 0600  piperacillin-tazobactam (ZOSYN) IVPB 3.375 g     3.375 g 12.5 mL/hr over 240 Minutes Intravenous Every 8 hours 11/16/18 2235     11/17/18 0000  piperacillin-tazobactam (ZOSYN) IVPB 3.375 g  Status:  Discontinued     3.375 g 100 mL/hr over 30 Minutes Intravenous Every 6 hours 11/16/18 2231 11/16/18 2235   11/16/18 0900  ceFEPIme (MAXIPIME) 1 g in sodium chloride 0.9 % 100 mL IVPB  Status:  Discontinued     1 g 200 mL/hr over 30 Minutes Intravenous Every 8 hours 11/16/18 0815 11/16/18 2231     Subjective: Seen and examined at bedside and states that she felt anxious and short of breath and was wanting to go home.  Did not think the Lasix helped much yesterday and did  not want the BiPAP again in the morning.  No chest pain.  No lightheadedness or dizziness no other concerns or complaints at this time but very frustrated.  Objective: Vitals:   11/17/18 1500 11/17/18 1600 11/17/18 1700 11/17/18 2000  BP: 132/81 123/84 136/66   Pulse:      Resp: (!) 24 (!) 31 (!) 30   Temp:   98.5 F (36.9 C) 98.2 F (36.8 C)  TempSrc:   Oral Oral  SpO2: 92%  96%   Weight:      Height:        Intake/Output Summary (Last 24 hours) at 11/17/2018 2057 Last data filed at 11/17/2018 1800 Gross per 24 hour  Intake 1268.43 ml  Output 600 ml  Net 668.43 ml   Filed Weights   11/16/18 0627 11/16/18 1908 11/17/18 0421  Weight: 100.6 kg 98.3 kg 97.5 kg    Examination: Physical Exam:  Constitutional: Well-nourished, well-developed morbidly obese chronically ill-appearing Caucasian female who was frustrated and anxious and wanted to go home Eyes: Lids and conjunctive are normal.  Sclera icteric ENMT: External ears and nose appear normal.  Grossly normal hearing Neck: Appears supple no JVD Respiratory: Diminished auscultation bilaterally with coarse breath sounds and rhonchi as well as some crackles.  Had slightly labored breathing and is wearing supplemental oxygen via nasal cannula Cardiovascular: Tachycardic rate but regular rhythm.  Has a 2 out of 6 systolic murmur. Abdomen: Soft, nontender, distended second but I was GU: Deferred but has a Foley catheter in place Musculoskeletal: No contractures or cyanosis.  No joint fomites upper Skin: Has bilateral lower extremity rash specifically in the dorsal aspect of the pedal area along with a rash on the back of her right leg Neurologic: Cranial nerves II through XII grossly intact with no appreciable focal deficit Psychiatric: Very anxious.  Is awake, alert, oriented  Data Reviewed: I have personally reviewed following labs and imaging studies  CBC: Recent Labs  Lab 11/13/18 0941 11/14/18 1620 11/15/18 0543 11/16/18 0502 11/17/18 0215  WBC 9.3 7.7 8.3 9.3 7.7  NEUTROABS 6.0 4.9  --  6.6 5.4  HGB 6.6* 9.5* 9.9* 9.0* 8.5*  HCT 20.8* 30.7* 31.7* 28.9* 27.3*  MCV 99.0 100.0 101.0* 100.0 102.6*  PLT 145* 131* 135* PLATELET CLUMPS NOTED ON SMEAR, UNABLE TO ESTIMATE 027*   Basic Metabolic Panel: Recent Labs  Lab 11/13/18 0941 11/14/18 1620 11/14/18 1621 11/15/18 0543 11/16/18 0502 11/17/18 0215  NA 133* 136  --  135 135 137  K 3.3* 3.2*  --  3.5 3.7 2.8*  CL 93* 95*  --  96* 94* 93*  CO2 31 31  --  30 29 30   GLUCOSE 135* 119*  --  155* 119* 117*  BUN 8 9  --  8 10 15   CREATININE 0.71 0.73  --  0.65 0.66 0.66  CALCIUM 7.6* 6.8*  --  6.4* 5.8* 5.2*  MG  --   --  2.4  --  1.9  2.2  PHOS  --   --  2.5  --  2.4* 2.3*   GFR: Estimated Creatinine Clearance: 82.6 mL/min (by C-G formula based on SCr of 0.66 mg/dL). Liver Function Tests: Recent Labs  Lab 11/13/18 0941 11/14/18 1620 11/15/18 0543 11/16/18 0502 11/17/18 0215  AST 23 26 36 31 22  ALT 14 17 22 18 16   ALKPHOS 329* 292* 295* 268* 208*  BILITOT 0.8 0.7 0.5 0.8 0.9  PROT 6.3* 6.3* 6.3* 5.9* 6.2*  ALBUMIN 1.9*  2.2* 2.1* 1.9* 2.3*   No results for input(s): LIPASE, AMYLASE in the last 168 hours. No results for input(s): AMMONIA in the last 168 hours. Coagulation Profile: Recent Labs  Lab 11/15/18 0543  INR 1.2   Cardiac Enzymes: No results for input(s): CKTOTAL, CKMB, CKMBINDEX, TROPONINI in the last 168 hours. BNP (last 3 results) No results for input(s): PROBNP in the last 8760 hours. HbA1C: No results for input(s): HGBA1C in the last 72 hours. CBG: No results for input(s): GLUCAP in the last 168 hours. Lipid Profile: No results for input(s): CHOL, HDL, LDLCALC, TRIG, CHOLHDL, LDLDIRECT in the last 72 hours. Thyroid Function Tests: No results for input(s): TSH, T4TOTAL, FREET4, T3FREE, THYROIDAB in the last 72 hours. Anemia Panel: No results for input(s): VITAMINB12, FOLATE, FERRITIN, TIBC, IRON, RETICCTPCT in the last 72 hours. Sepsis Labs: Recent Labs  Lab 11/16/18 0910 11/16/18 1158 11/17/18 0215  PROCALCITON 1.02  --  2.30  LATICACIDVEN 1.5 1.3  --     Recent Results (from the past 240 hour(s))  SARS Coronavirus 2 (CEPHEID - Performed in [facility] hospital lab), Hosp Order     Status: None   Collection Time: 11/14/18  4:34 PM   Specimen: Nasopharyngeal Swab  Result Value Ref Range Status   SARS Coronavirus 2 NEGATIVE NEGATIVE Final    Comment: (NOTE) If result is NEGATIVE SARS-CoV-2 target nucleic acids are NOT DETECTED. The SARS-CoV-2 RNA is generally detectable in upper and lower  respiratory specimens during the acute phase of infection. The lowest  concentration of  SARS-CoV-2 viral copies this assay can detect is 250  copies / mL. A negative result does not preclude SARS-CoV-2 infection  and should not be used as the sole basis for treatment or other  patient management decisions.  A negative result may occur with  improper specimen collection / handling, submission of specimen other  than nasopharyngeal swab, presence of viral mutation(s) within the  areas targeted by this assay, and inadequate number of viral copies  (<250 copies / mL). A negative result must be combined with clinical  observations, patient history, and epidemiological information. If result is POSITIVE SARS-CoV-2 target nucleic acids are DETECTED. The SARS-CoV-2 RNA is generally detectable in upper and lower  respiratory specimens dur ing the acute phase of infection.  Positive  results are indicative of active infection with SARS-CoV-2.  Clinical  correlation with patient history and other diagnostic information is  necessary to determine patient infection status.  Positive results do  not rule out bacterial infection or co-infection with other viruses. If result is PRESUMPTIVE POSTIVE SARS-CoV-2 nucleic acids MAY BE PRESENT.   A presumptive positive result was obtained on the submitted specimen  and confirmed on repeat testing.  While 2019 novel coronavirus  (SARS-CoV-2) nucleic acids may be present in the submitted sample  additional confirmatory testing may be necessary for epidemiological  and / or clinical management purposes  to differentiate between  SARS-CoV-2 and other Sarbecovirus currently known to infect humans.  If clinically indicated additional testing with an alternate test  methodology [phone]) is advised. The SARS-CoV-2 RNA is generally  detectable in upper and lower respiratory sp ecimens during the acute  phase of infection. The expected result is Negative. Fact Sheet for Patients:  StrictlyIdeas.no Fact Sheet for Healthcare  Providers: BankingDealers.co.za This test is not yet approved or cleared by the [place] FDA and has been authorized for detection and/or diagnosis of SARS-CoV-2 by FDA under an Emergency Use Authorization (EUA).  This EUA will remain in effect (meaning this test can be used) for the duration of the COVID-19 declaration under Section 564(b)(1) of the Act, 21 U.S.C. section 360bbb-3(b)(1), unless the authorization is terminated or revoked sooner. Performed at [facility], [place] [address], [place], [place] [address]   MRSA PCR Screening     Status: None   Collection Time: 11/16/18  8:02 AM   Specimen: Nasal Mucosa; Nasopharyngeal  Result Value Ref Range Status   MRSA by PCR NEGATIVE NEGATIVE Final    Comment:        The GeneXpert MRSA Assay (FDA approved for NASAL specimens only), is one component of a comprehensive MRSA colonization surveillance program. It is not intended to diagnose MRSA infection nor to guide or monitor treatment for MRSA infections. Performed at [facility], [place] [address], [place],  [address]     Radiology Studies: Ct Chest Wo Contrast  Result Date: 11/17/2018 CLINICAL DATA:  58 year old female Caucasian female nonsmoker with history of bilateral lower extremity edema. Possible stage IV breast cancer. EXAM: CT CHEST WITHOUT CONTRAST TECHNIQUE: Multidetector CT imaging of the chest was performed following the standard protocol without IV contrast. COMPARISON:  Chest CT 10/26/2018. FINDINGS: Cardiovascular: Heart size is normal. There is no significant pericardial fluid, thickening or pericardial calcification. There is aortic atherosclerosis, as well as atherosclerosis of the great vessels of the mediastinum and the coronary arteries, including calcified atherosclerotic plaque in the left anterior descending and right coronary arteries. Mediastinum/Nodes: No pathologically enlarged mediastinal  or hilar lymph nodes. Please note that accurate exclusion of hilar adenopathy is limited on noncontrast CT scans. Esophagus is unremarkable in appearance. Multiple borderline enlarged and mildly enlarged bilateral axillary and subpectoral lymph nodes (right greater than left) measuring up to 15 mm in short axis on the right (axial image 49 of series 2). Lungs/Pleura: Extensive septal thickening with band like opacities throughout both lungs, similar to the prior study. No discrete pulmonary nodules or masses are confidently identified. Moderate left and small right pleural effusions. The left pleural effusion is predominantly sub pulmonic in position. Upper Abdomen: Several peripherally calcified gallstones lying dependently in the gallbladder measuring up to 1.9 cm in diameter. Aortic atherosclerosis. Musculoskeletal: Right breast is incompletely imaged, but there appears to be irregular soft tissue infiltration throughout much of the right breast tissue, which could reflect tumoral in for tray shin and/or edema. Diffuse lytic and sclerotic lesions throughout the visualized axial and appendicular skeleton, indicative of widespread metastatic disease to the bones. With appears very similar to the prior examination. There continue to be multiple nondisplaced to minimally displaced pathologic fractures throughout the ribs bilaterally. In addition, there is a new widely displaced fracture of the medial aspect of the right clavicle with approximately 1.4 cm of lateral and anterior displacement, as well as high attenuation thickening of the adjacent musculature in the upper right pectoralis major and lower right sternocleidomastoid muscles, most compatible with intramuscular hematoma and/or tumor. IMPRESSION: 1. Widespread metastatic disease to the bones redemonstrated, with multiple nondisplaced minimally displaced bilateral rib fractures, as well as a new widely displaced fracture of the medial aspect of the right  clavicle with probable adjacent intramuscular hemorrhage in the lower right sternocleidomastoid muscle and upper right pectoralis major muscle. Probable tumoral infiltration throughout the right breast, as well as bilateral axillary and subpectoral lymphadenopathy (right greater than left). 2. Unusual appearance of the lungs, similar to the prior study, which could suggest chronic systemic disease such as  sarcoidosis or interstitial lung disease. The possibility of lymphangitic spread of tumor in the lungs could be considered, but is not strongly favored at this time. 3. Moderate left (predominantly subpulmonic) pleural effusion and small right pleural effusion. 4. Cholelithiasis. 5. Aortic atherosclerosis, in addition to 2 vessel coronary artery disease. Aortic Atherosclerosis (ICD10-I70.0). Electronically Signed   By: Vinnie Langton M.D.   On: 11/17/2018 14:55   Dg Chest Port 1 View  Result Date: 11/17/2018 CLINICAL DATA:  Shortness of breath.  Metastatic breast cancer. EXAM: PORTABLE CHEST 1 VIEW COMPARISON:  11/16/2018.  CT 10/26/2018. FINDINGS: Mediastinum stable. Heart size normal. Persistent left base atelectasis infiltrate. Right base atelectasis/infiltrate also noted on today's exam. Diffuse bilateral interstitial prominence again noted. Tiny left pleural effusion again noted. No pneumothorax. Multiple lytic lesions noted throughout the visualized bony structures. IMPRESSION: 1. Persistent left base atelectasis/infiltrate. Right base atelectasis/infiltrate also noted on today's exam. Small left pleural effusion again noted. 2. Diffuse bilateral interstitial prominence again noted consistent with interstitial tumor spread this patient with known metastatic disease. 3. Multiple lytic lesions are again noted throughout the visualized bony structures consistent with metastatic disease. Electronically Signed   By: Marcello Moores  Register   On: 11/17/2018 06:24   Dg Chest Port 1 View  Result Date:  11/16/2018 CLINICAL DATA:  Dyspnea, metastatic breast cancer EXAM: PORTABLE CHEST 1 VIEW COMPARISON:  Chest radiograph from one day prior. FINDINGS: Low lung volumes. Stable cardiomediastinal silhouette with mild cardiomegaly. No pneumothorax. Stable small bilateral pleural effusions. Stable patchy left lung base consolidation. Stable thick irregular reticular opacities in the upper lungs. IMPRESSION: 1. Stable low lung volumes with small bilateral pleural effusions. 2. Stable thick irregular reticular opacities in the upper lungs and patchy left lung base consolidation. Findings likely representing combination of lymphangitic tumor (as seen on recent chest CT) and atelectasis. A component of mild pulmonary edema cannot be excluded. Electronically Signed   By: Ilona Sorrel M.D.   On: 11/16/2018 07:13   Vas [place] Lower Extremity Venous (dvt)  Result Date: 11/17/2018  Lower Venous Study Indications: Swelling.  Risk Factors: Cancer. Limitations: Body habitus and poor ultrasound/tissue interface. Comparison Study: No prior studies. Performing Technologist: Oliver Hum RVT  Examination Guidelines: A complete evaluation includes B-mode imaging, spectral Doppler, color Doppler, and power Doppler as needed of all accessible portions of each vessel. Bilateral testing is considered an integral part of a complete examination. Limited examinations for reoccurring indications may be performed as noted.  +---------+---------------+---------+-----------+----------+-------+  RIGHT     Compressibility Phasicity Spontaneity Properties Summary  +---------+---------------+---------+-----------+----------+-------+  CFV       Full            Yes       Yes                             +---------+---------------+---------+-----------+----------+-------+  SFJ       Full                                                      +---------+---------------+---------+-----------+----------+-------+  FV Prox   Full                                                       +---------+---------------+---------+-----------+----------+-------+  FV Mid    Full                                                      +---------+---------------+---------+-----------+----------+-------+  FV Distal Full                                                      +---------+---------------+---------+-----------+----------+-------+  PFV       Full                                                      +---------+---------------+---------+-----------+----------+-------+  POP       Full            Yes       Yes                             +---------+---------------+---------+-----------+----------+-------+  PTV       Full                                                      +---------+---------------+---------+-----------+----------+-------+  PERO      Full                                                      +---------+---------------+---------+-----------+----------+-------+   +---------+---------------+---------+-----------+----------+-------+  LEFT      Compressibility Phasicity Spontaneity Properties Summary  +---------+---------------+---------+-----------+----------+-------+  CFV       Full            Yes       Yes                             +---------+---------------+---------+-----------+----------+-------+  SFJ       Full                                                      +---------+---------------+---------+-----------+----------+-------+  FV Prox   Full                                                      +---------+---------------+---------+-----------+----------+-------+  FV Mid    Full                                                      +---------+---------------+---------+-----------+----------+-------+  FV Distal Full                                                      +---------+---------------+---------+-----------+----------+-------+  PFV       Full                                                       +---------+---------------+---------+-----------+----------+-------+  POP       Full            Yes       Yes                             +---------+---------------+---------+-----------+----------+-------+  PTV       Full                                                      +---------+---------------+---------+-----------+----------+-------+  PERO      Full                                                      +---------+---------------+---------+-----------+----------+-------+     Summary: Right: There is no evidence of deep vein thrombosis in the lower extremity. No cystic structure found in the popliteal fossa. Left: There is no evidence of deep vein thrombosis in the lower extremity. No cystic structure found in the popliteal fossa.  *See table(s) above for measurements and observations.    Preliminary    Scheduled Meds:  anastrozole  1 mg Oral Daily   calcium-vitamin D  1 tablet Oral BID   Chlorhexidine Gluconate Cloth  6 each Topical Daily   feeding supplement (ENSURE ENLIVE)  237 mL Oral BID BM   furosemide  40 mg Intravenous BID   heparin  5,000 Units Subcutaneous Q8H   mouth rinse  15 mL Mouth Rinse q12n4p    morphine injection  1 mg Intravenous Q2H   potassium chloride  30 mEq Oral BID   Continuous Infusions:  sodium chloride Stopped (11/16/18 1407)   piperacillin-tazobactam (ZOSYN)  IV Stopped (11/17/18 1820)    LOS: 3 days   Kerney Elbe, DO [facility] PAGER is on AMION  If 7PM-7AM, please contact night-coverage [email or web address] Password [facility] 11/17/2018, 8:57 PM

## 2018-11-17 NOTE — Progress Notes (Signed)
NAME:  Suzanne [name], MRN:  [id number], DOB:  11/08/1960, LOS: 3 ADMISSION DATE:  11/14/2018, CONSULTATION DATE:  6/21 REFERRING MD:  Dr. Alfredia Ferguson, CHIEF COMPLAINT:     Brief History   58 y/o F admitted 6/19 with SOB / DOE.  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 Received PRBC on 6/18 and developed SOB / DOE after transfusion.   Past Medical History  Stage IV Breast Cancer  Malignant Bilateral Pleural Effusions Chronic Hypoxic Respiratory Failure  Significant Hospital Events   6/18 Blood transfusion with SOB, DOE after  6/19 Admit   Consults:  PCCM   Procedures:     Significant Diagnostic Tests:  6/02 Right Pleural Fluid Cytology >> positive for malignant cells c/w breast cancer  6/20 Left Thoracentesis >> 400 ml dark red pleural fluid removed   Micro Data:  COVID 6/19 >> negative   Antimicrobials:  Cefepime 6/21  Zosyn 6/22 >>  Interim history/subjective:  Pt tearful, states she is "scared". Denies pain but has "some shortness of breath"  Objective   Blood pressure 130/79, pulse (!) 101, temperature 99.7 F (37.6 C), temperature source Core, resp. rate (!) 29, height 5\' 2"  (1.575 m), weight 97.5 kg, last menstrual period 05/28/2013, SpO2 95 %.    FiO2 (%):  [35 %] 35 %   Intake/Output Summary (Last 24 hours) at 11/17/2018 1300 Last data filed at 11/17/2018 1000 Gross per 24 hour  Intake 1054.98 ml  Output 850 ml  Net 204.98 ml   Filed Weights   11/16/18 0627 11/16/18 1908 11/17/18 0421  Weight: 100.6 kg 98.3 kg 97.5 kg    Examination: General: obese female lying in bed, dyspneic but no acute distress HENT: pupils 90mm/R, MM pink/dry Lungs: tachypneic but in no distress, lungs bilaterally distant  Cardiovascular: s1s2 rrr, tachy  Abdomen: obese/soft, bsx4 active  Extremities: warm/dry, BLE's with blanching erythema on top of feet Neuro: AAOx4, speech clear, MAE PSY: anxious  Resolved Hospital Problem list       Assessment & Plan:    Acute on Chronic Hypoxic Respiratory Failure  P:  Wean O2 for sats >90% Will likely need O2 at home  Assess BLE venous duplex to r/o DVT, risk for PE  Follow intermittent CXR Await CT chest w/o findings from 6/22   Stage IV Breast Cancer with Malignant (right) Pleural Effusion -CT 5/31 with concern for lymphangetic spread of tumor, diffuse bony metastatic disease -Pleural fluid 6/2 with malignant cells  P: Plan of care per ONC in regards to cancer therapy  Consider pleurX catheter placement if fluid re-accumulation for patient comfort  Consider palliative care input for symptom management  Hypophosphatemia Hypokalemia  P: Monitor, replace per primary as indicated   Best practice:  Diet: Per primary  Pain/Anxiety/Delirium protocol (if indicated): n/a  VAP protocol (if indicated): n/a  DVT prophylaxis: heparin  GI prophylaxis: n/a Glucose control: n/a Mobility: As tolerated  Code Status: Full Code  Family Communication: Attempted to call patient's daughter [name] with no answer.  Message left for return call.  Disposition: SDU, per [facility]  Labs   CBC: Recent Labs  Lab 11/13/18 0941 11/14/18 1620 11/15/18 0543 11/16/18 0502 11/17/18 0215  WBC 9.3 7.7 8.3 9.3 7.7  NEUTROABS 6.0 4.9  --  6.6 5.4  HGB 6.6* 9.5* 9.9* 9.0* 8.5*  HCT 20.8* 30.7* 31.7* 28.9* 27.3*  MCV 99.0 100.0 101.0* 100.0 102.6*  PLT 145* 131* 135* PLATELET CLUMPS NOTED ON SMEAR, UNABLE TO ESTIMATE  101*    Basic Metabolic Panel: Recent Labs  Lab 11/13/18 0941 11/14/18 1620 11/14/18 1621 11/15/18 0543 11/16/18 0502 11/17/18 0215  NA 133* 136  --  135 135 137  K 3.3* 3.2*  --  3.5 3.7 2.8*  CL 93* 95*  --  96* 94* 93*  CO2 31 31  --  30 29 30   GLUCOSE 135* 119*  --  155* 119* 117*  BUN 8 9  --  8 10 15   CREATININE 0.71 0.73  --  0.65 0.66 0.66  CALCIUM 7.6* 6.8*  --  6.4* 5.8* 5.2*  MG  --   --  2.4  --  1.9 2.2  PHOS  --   --  2.5  --  2.4* 2.3*   GFR: Estimated  Creatinine Clearance: 82.6 mL/min (by C-G formula based on SCr of 0.66 mg/dL). Recent Labs  Lab 11/14/18 1620 11/15/18 0543 11/16/18 0502 11/16/18 0910 11/16/18 1158 11/17/18 0215  PROCALCITON  --   --   --  1.02  --  2.30  WBC 7.7 8.3 9.3  --   --  7.7  LATICACIDVEN  --   --   --  1.5 1.3  --     Liver Function Tests: Recent Labs  Lab 11/13/18 0941 11/14/18 1620 11/15/18 0543 11/16/18 0502 11/17/18 0215  AST 23 26 36 31 22  ALT 14 17 22 18 16   ALKPHOS 329* 292* 295* 268* 208*  BILITOT 0.8 0.7 0.5 0.8 0.9  PROT 6.3* 6.3* 6.3* 5.9* 6.2*  ALBUMIN 1.9* 2.2* 2.1* 1.9* 2.3*   No results for input(s): LIPASE, AMYLASE in the last 168 hours. No results for input(s): AMMONIA in the last 168 hours.  ABG    Component Value Date/Time   PHART 7.496 (H) 11/16/2018 2330   PCO2ART 44.1 11/16/2018 2330   PO2ART 82.5 (L) 11/16/2018 2330   HCO3 33.3 (H) 11/16/2018 2330   O2SAT 96.4 11/16/2018 2330     Coagulation Profile: Recent Labs  Lab 11/15/18 0543  INR 1.2    Cardiac Enzymes: No results for input(s): CKTOTAL, CKMB, CKMBINDEX, TROPONINI in the last 168 hours.  HbA1C: Hgb A1c MFr Bld  Date/Time Value Ref Range Status  10/27/2018 08:59 AM 5.3 4.8 - 5.6 % Final    Comment:    (NOTE) Pre diabetes:          5.7%-6.4% Diabetes:              >6.4% Glycemic control for   <7.0% adults with diabetes     CBG: No results for input(s): GLUCAP in the last 168 hours.   Critical care time: n/a    Noe Gens, NP-C [facility] Pgr: [phone] or if no answer [phone] 11/17/2018, 1:00 PM

## 2018-11-17 NOTE — Progress Notes (Signed)
Bilateral lower extremity venous duplex has been completed. Preliminary results can be found in CV Proc through chart review.   11/17/18 3:02 PM Suzanne [name] RVT

## 2018-11-17 NOTE — Progress Notes (Addendum)
CRITICAL VALUE ALERT  Critical Value:  Calcium 5.2  Date & Time Notied: 11/17/2018 0410AM  Provider Notified: Georges Mouse   Orders Received/Actions taken:  Ionized Ca ordered

## 2018-11-17 NOTE — Progress Notes (Signed)
Pt ripped all her EKG leads and bipap off. Pt stated that she wanted to go home and die. Reminded pt that she had spoke with her daughter and then voiced to me and the MD that she wanted everything done if her heart was to stop beating and she needed a breathing tube. Pt stated that she didn't care that she just wanted to go home and die. Informed pt that this was her right but that she would need to discuss this with the MD in the am and her daughter. Pt adamant that I call her daughter. Called pts daughter and let pt speak to her daughter, pts daughter spoke with pt became more amicable, and allowed nurse to put EKG leads, O2 monitor back on and after a break from the bipap, will put bipap back on.

## 2018-11-17 NOTE — Progress Notes (Addendum)
HEMATOLOGY-ONCOLOGY PROGRESS NOTE  SUBJECTIVE: Wants to go home. Patient reports ongoing shortness of breath. Did not really notice much improvement following thoracentesis on 11/15/2018. Refusing BiPAP intermittently. Has rash to her bilateral feet which started just prior to admission. Having loose stools this am. Had Magnesium citrate yesterday for constipation. Tearful at times. Has changed code status from DNR back to full code, but states that she "wants to go home and die."   REVIEW OF SYSTEMS:   Constitutional: Denies fevers, chills Eyes: Denies blurriness of vision Ears, nose, mouth, throat, and face: Denies mucositis or sore throat Respiratory: Reports shortness of breath Cardiovascular: Denies palpitation, chest discomfort Gastrointestinal:  Denies nausea, heartburn or change in bowel habits Skin: Has rash to bilateral feet.  Lymphatics: Denies new lymphadenopathy or easy bruising Neurological:Denies numbness, tingling or new weaknesses Behavioral/Psych: Reports being tearful.   Extremities: Has swelling in her LE. All other systems were reviewed with the patient and are negative.  I have reviewed the past medical history, past surgical history, social history and family history with the patient and they are unchanged from previous note.   PHYSICAL EXAMINATION:  Vitals:   11/17/18 0800 11/17/18 0809  BP: 107/82   Pulse: 84 98  Resp: (!) 26 (!) 29  Temp: 98.4 F (36.9 C)   SpO2: (!) 88% 92%   Filed Weights   11/16/18 0627 11/16/18 1908 11/17/18 0421  Weight: 221 lb 12.5 oz (100.6 kg) 216 lb 11.4 oz (98.3 kg) 214 lb 15.2 oz (97.5 kg)    Intake/Output from previous day: 06/21 0701 - 06/22 0700 In: 1080.2 [P.O.:120; I.V.:28.3; IV Piggyback:356.9] Out: 850 [Urine:850]  GENERAL:alert, appears short of breath SKIN: Discoloration of her bilateral feet. EYES: normal, Conjunctiva are pink and non-injected, sclera clear OROPHARYNX:no exudate, no erythema and lips, buccal  mucosa, and tongue normal  NECK: supple, thyroid normal size, non-tender, without nodularity LYMPH:  no palpable lymphadenopathy in the cervical, axillary or inguinal LUNGS: Tachypneic.  Diminished bilaterally. HEART: regular rate & rhythm and no murmurs.  1-2+ bilateral lower extremity edema. ABDOMEN:abdomen soft, non-tender and normal bowel sounds Musculoskeletal:no cyanosis of digits and no clubbing  NEURO: alert & oriented x 3 with fluent speech, no focal motor/sensory deficits. Anxious and tearful at times.  LABORATORY DATA:  I have reviewed the data as listed CMP Latest Ref Rng & Units 11/17/2018 11/16/2018 11/15/2018  Glucose 70 - 99 mg/dL 117(H) 119(H) 155(H)  BUN 6 - 20 mg/dL _0 Creatinine 0.44 - 1.00 mg/dL 0.66 0.66 0.65  Sodium 135 - 145 mmol/L 137 135 135  Potassium 3.5 - 5.1 mmol/L 2.8(L) 3.7 3.5  Chloride 98 - 111 mmol/L 93(L) 94(L) 96(L)  CO2 22 - 32 mmol/L _1 Calcium 8.9 - 10.3 mg/dL 5.2(LL) 5.8(LL) 6.4(LL)  Total Protein 6.5 - 8.1 g/dL 6.2(L) 5.9(L) 6.3(L)  Total Bilirubin 0.3 - 1.2 mg/dL 0.9 0.8 0.5  Alkaline Phos 38 - 126 U/L 208(H) 268(H) 295(H)  AST 15 - 41 U/L 22 31 36  ALT 0 - 44 U/L _2 Lab Results  Component Value Date   WBC 7.7 11/17/2018   HGB 8.5 (L) 11/17/2018   HCT 27.3 (L) 11/17/2018   MCV 102.6 (H) 11/17/2018   PLT 101 (L) 11/17/2018   NEUTROABS 5.4 11/17/2018    Dg Chest 1 View  Result Date: 11/15/2018 CLINICAL DATA:  Post left-sided thoracentesis. EXAM: CHEST  1 VIEW COMPARISON:  11/14/2018 FINDINGS: Lungs are hypoinflated demonstrate interval  improvement with moderate residual left-sided pleural effusion likely with associated basilar atelectasis. No pneumothorax. Mild prominence of the perihilar markings without significant change. Cardiomediastinal silhouette and remainder of the exam is unchanged. IMPRESSION: Hypoinflation with interval improvement in left pleural effusion post thoracentesis. No pneumothorax. Moderate  residual left effusion likely with associated basilar atelectasis. Prominence of the perihilar markings suggesting vascular congestion. Electronically Signed   By: Marin Olp M.D.   On: 11/15/2018 13:58   Dg Chest 1 View  Result Date: 10/29/2018 CLINICAL DATA:  Post LEFT thoracentesis EXAM: CHEST  1 VIEW COMPARISON:  622 in 20 FINDINGS: Upper normal heart size. Stable mediastinal contours. BILATERAL upper lobe scarring. Improved aeration at bases since prior study. Minimal residual LEFT pleural effusion without pneumothorax. Bones demineralized. IMPRESSION: Extensive chronic lung changes. No pneumothorax following LEFT thoracentesis. Electronically Signed   By: Lavonia Dana M.D.   On: 10/29/2018 15:50   Dg Chest 1 View  Result Date: 10/28/2018 CLINICAL DATA:  Post right thoracentesis EXAM: CHEST  1 VIEW COMPARISON:  10/28/2018 FINDINGS: Very low lung volumes again noted. Interstitial prominence within the lungs. Heart is borderline in size with vascular congestion. No pneumothorax following right thoracentesis. Diffuse skeletal metastases again noted. IMPRESSION: Very low lung volumes with cardiomegaly, vascular congestion and interstitial prominence. No pneumothorax following thoracentesis. Electronically Signed   By: Rolm Baptise M.D.   On: 10/28/2018 16:00   Dg Chest 2 View  Result Date: 11/14/2018 CLINICAL DATA:  Stage IV breast cancer.  Shortness of breath. EXAM: CHEST - 2 VIEW COMPARISON:  Radiographs 11/13/2018 and 10/29/2018.  CT 10/26/2018. FINDINGS: The heart size and mediastinal contours are stable. Left pleural effusion and left basilar opacity are unchanged from the recent study. Diffusely increased interstitial markings are again noted throughout both lungs, remaining suspicious for lymphangitic spread of tumor based on previous CT. No progressive airspace opacities identified. Diffuse osseous metastatic disease noted without evidence of acute fracture. IMPRESSION: Stable radiographic  appearance of the chest since yesterday. A left pleural effusion and left basilar pulmonary opacity have increased from older prior studies. Evidence of extensive metastatic disease again noted. Electronically Signed   By: Richardean Sale M.D.   On: 11/14/2018 17:10   Dg Chest 2 View  Result Date: 11/13/2018 CLINICAL DATA:  Shortness of breath, weakness. Cancer patient. EXAM: CHEST - 2 VIEW COMPARISON:  Chest x-ray dated 10/29/2018. Chest CT dated 10/26/2018. FINDINGS: Heart size and mediastinal contours appear stable. Increased opacity at the LEFT lung base, likely increased pleural effusion. There is also central pulmonary vascular congestion and bilateral perihilar edema. No pneumothorax seen. Diffuse osseous metastases, as described on previous CT report. IMPRESSION: 1. Increased opacity at the LEFT lung base, likely increased pleural effusion. Superimposed pneumonia or airspace collapse not excluded. 2. Central pulmonary vascular congestion and bilateral perihilar edema indicating CHF/volume overload. 3. Diffuse osseous metastases. Electronically Signed   By: Franki Cabot M.D.   On: 11/13/2018 12:32   Ct Head W & Wo Contrast  Result Date: 10/30/2018 CLINICAL DATA:  Metastatic breast cancer. No neurologic symptoms are reported. EXAM: CT HEAD WITHOUT AND WITH CONTRAST TECHNIQUE: Contiguous axial images were obtained from the base of the skull through the vertex without and with intravenous contrast CONTRAST:  46m ISOVUE-300 IOPAMIDOL (ISOVUE-300) INJECTION 61% COMPARISON:  Nuclear medicine whole-body bone scan, 10/27/2018 FINDINGS: Brain: No acute stroke, hemorrhage, mass lesion or hydrocephalus. Mild atrophy. No extra-axial fluid. Post infusion, no abnormal enhancement of the brain or visible meninges. Vascular: No hyperdense vessel  or unexpected calcification. Visible vessels are patent. Skull: Widespread metastatic disease to the calvarium, skull base, and visible cervical spine. Metastatic disease to  the mandible is also present. There is destruction of the inner and outer table of the skull in multiple locations. Epidural tumor is not established. Sinuses/Orbits: No acute finding. Other: None. IMPRESSION: 1. Widespread osseous metastatic disease to the calvarium, skull base, and visible cervical spine. Epidural tumor not established. 2. No intracranial metastatic disease is evident. Electronically Signed   By: Staci Righter M.D.   On: 10/30/2018 09:50   Ct Chest W Contrast  Result Date: 10/26/2018 CLINICAL DATA:  Shortness of breath, possible metastatic disease. Breast cancer. EXAM: CT CHEST WITH CONTRAST TECHNIQUE: Multidetector CT imaging of the chest was performed during intravenous contrast administration. CONTRAST:  71m OMNIPAQUE IOHEXOL 300 MG/ML  SOLN COMPARISON:  Chest x-ray earlier today FINDINGS: Cardiovascular: Mild cardiomegaly. Moderate coronary artery calcifications in the left anterior descending coronary artery. Aorta is normal caliber. Scattered atherosclerotic change. Mediastinum/Nodes: Numerous borderline sized and mildly enlarged mediastinal lymph nodes. Right paratracheal node measures 12 mm. Borderline size bilateral hilar lymph nodes, the largest on the right measuring 11 mm in short axis diameter. Numerous enlarged bilateral axillary lymph nodes with index left axillary lymph node having a short axis diameter of 2.1 cm on image 21 and right axillary lymph node having a short axis diameter of 1.4 cm on image 48. Lungs/Pleura: Moderate bilateral pleural effusions. Extensive interstitial disease throughout the lungs bilaterally. Mild reticulonodular densities. Cannot exclude lymphangitic spread of tumor. Upper Abdomen: Gallstones noted within the gallbladder. Musculoskeletal: Chest wall soft tissues are unremarkable. Extensive diffuse mixed lytic and sclerotic lesions throughout the visualized skeleton compatible with diffuse skeletal metastases. IMPRESSION: Bilateral axillary and  mediastinal adenopathy. Borderline hilar lymph nodes bilaterally. Findings likely reflect metastatic lymphadenopathy. Moderate bilateral pleural effusions. Extensive interstitial thickening and reticulonodular opacities throughout the lungs. Cannot exclude lymphangitic spread of tumor. Diffuse skeletal metastases. Cholelithiasis. Coronary artery disease. Aortic Atherosclerosis (ICD10-I70.0). Electronically Signed   By: KRolm BaptiseM.D.   On: 10/26/2018 22:54   Nm Bone Scan Whole Body  Result Date: 10/27/2018 CLINICAL DATA:  Breast cancer staging. EXAM: NUCLEAR MEDICINE WHOLE BODY BONE SCAN TECHNIQUE: Whole body anterior and posterior images were obtained approximately 3 hours after intravenous injection of radiopharmaceutical. RADIOPHARMACEUTICALS:  20.6 mCi Technetium-947mDP IV COMPARISON:  CT scan Oct 26, 2018 FINDINGS: Bony metastatic disease is seen in both of the femurs, numerous ribs, the manubrium, and right humerus. The widespread bony metastatic disease in the scapula, thoracic spine, sternum, and proximal humeri is better appreciated on the comparison CT scan. IMPRESSION: Widespread bony metastatic disease. The extent of metastatic disease is actually better appreciated on the recent CT imaging. This study is a near super scan consistent with widespread bony metastatic disease. Electronically Signed   By: DaDorise BullionII M.D   On: 10/27/2018 16:01   Dg Chest Port 1 View  Result Date: 11/17/2018 CLINICAL DATA:  Shortness of breath.  Metastatic breast cancer. EXAM: PORTABLE CHEST 1 VIEW COMPARISON:  11/16/2018.  CT 10/26/2018. FINDINGS: Mediastinum stable. Heart size normal. Persistent left base atelectasis infiltrate. Right base atelectasis/infiltrate also noted on today's exam. Diffuse bilateral interstitial prominence again noted. Tiny left pleural effusion again noted. No pneumothorax. Multiple lytic lesions noted throughout the visualized bony structures. IMPRESSION: 1. Persistent left base  atelectasis/infiltrate. Right base atelectasis/infiltrate also noted on today's exam. Small left pleural effusion again noted. 2. Diffuse bilateral interstitial prominence again noted consistent  with interstitial tumor spread this patient with known metastatic disease. 3. Multiple lytic lesions are again noted throughout the visualized bony structures consistent with metastatic disease. Electronically Signed   By: Marcello Moores  Register   On: 11/17/2018 06:24   Dg Chest Port 1 View  Result Date: 11/16/2018 CLINICAL DATA:  Dyspnea, metastatic breast cancer EXAM: PORTABLE CHEST 1 VIEW COMPARISON:  Chest radiograph from one day prior. FINDINGS: Low lung volumes. Stable cardiomediastinal silhouette with mild cardiomegaly. No pneumothorax. Stable small bilateral pleural effusions. Stable patchy left lung base consolidation. Stable thick irregular reticular opacities in the upper lungs. IMPRESSION: 1. Stable low lung volumes with small bilateral pleural effusions. 2. Stable thick irregular reticular opacities in the upper lungs and patchy left lung base consolidation. Findings likely representing combination of lymphangitic tumor (as seen on recent chest CT) and atelectasis. A component of mild pulmonary edema cannot be excluded. Electronically Signed   By: Ilona Sorrel M.D.   On: 11/16/2018 07:13   Dg Chest Port 1 View  Result Date: 10/28/2018 CLINICAL DATA:  Bone injury EXAM: PORTABLE CHEST 1 VIEW COMPARISON:  10/26/2018 FINDINGS: Diffuse widespread metastatic disease is again noted involving the osseous structures. There is an acute appearing fracture involving the fourth rib posterolaterally on the left. There are likely additional left-sided rib fractures that are suboptimally evaluated on this exam. The lung volumes are low. The cardiac silhouette is enlarged. There are growing bilateral pleural effusions and prominent bilateral interstitial lung markings. Bibasilar airspace opacities are noted. IMPRESSION: 1.  Acute left-sided rib fracture involving the fourth rib posterolaterally. No pneumothorax. There are likely additional adjacent fractures that are suboptimally evaluated on this exam. 2. Low lung volumes. 3. Cardiomegaly with growing bilateral pleural effusions and prominent interstitial lung markings suggestive of progressive pulmonary edema. Electronically Signed   By: Constance Holster M.D.   On: 10/28/2018 01:55   Dg Chest Portable 1 View  Result Date: 10/26/2018 CLINICAL DATA:  Shortness of breath, possible right breast cancer EXAM: PORTABLE CHEST 1 VIEW COMPARISON:  None. FINDINGS: Low lung volumes. No pleural effusion. Diffuse interstitial opacity. Heart size upper limits of normal. No pneumothorax. Diffuse sclerosis and lucent lesions within the clavicles, ribs and shoulders. IMPRESSION: 1. Low lung volumes without consolidation or effusion. Diffuse bilateral interstitial opacity of unknown chronicity. Findings could be secondary to interstitial inflammatory process, atypical/viral pneumonia, or possible metastatic disease given clinical history. CT chest suggested for further evaluation 2. 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   On: 10/26/2018 20:11   Dg Abd 2 Views  Result Date: 11/13/2018 CLINICAL DATA:  Pt c/o sob, weakness, cancer patient. EXAM: ABDOMEN - 2 VIEW COMPARISON:  None. FINDINGS: Overall visualized bowel gas pattern is nonobstructive. Moderate amount of stool and gas throughout the colon. No evidence of free intraperitoneal air seen. Diffuse osseous metastases, as previously described. IMPRESSION: 1. Nonobstructive bowel gas pattern. Moderate amount of stool and gas throughout the colon. 2. Diffuse osseous metastases. Electronically Signed   By: Franki Cabot M.D.   On: 11/13/2018 12:33   Ir Thoracentesis Asp Pleural Space W/img Guide  Result  Date: 10/29/2018 INDICATION: Patient with history of metastatic disease on imaging, dyspnea, and bilateral pleural effusions. Request is made for therapeutic left thoracentesis. EXAM: ULTRASOUND GUIDED THERAPEUTIC LEFT THORACENTESIS MEDICATIONS: 10 mL 1% lidocaine COMPLICATIONS: None immediate. PROCEDURE: An ultrasound guided thoracentesis was thoroughly discussed with the patient and  questions answered. The benefits, risks, alternatives and complications were also discussed. The patient understands and wishes to proceed with the procedure. Written consent was obtained. Ultrasound was performed to localize and mark an adequate pocket of fluid in the left chest. The area was then prepped and draped in the normal sterile fashion. 1% Lidocaine was used for local anesthesia. Under ultrasound guidance a 6 Fr Safe-T-Centesis catheter was introduced. Thoracentesis was performed. The catheter was removed and a dressing applied. FINDINGS: A total of approximately 350 mL of hazy gold fluid was removed. IMPRESSION: Successful ultrasound guided left thoracentesis yielding 350 mL of pleural fluid. Read by: Earley Abide, PA-C Electronically Signed   By: Jerilynn Mages.  Shick M.D.   On: 10/29/2018 15:58   Ir Thoracentesis Asp Pleural Space W/img Guide  Result Date: 10/28/2018 INDICATION: Patient with history of metastatic disease (including bone metastases) on imaging, dyspnea, and bilateral pleural effusions (R>L). Request is made for diagnostic and therapeutic right thoracentesis. EXAM: ULTRASOUND GUIDED DIAGNOSTIC AND THERAPEUTIC RIGHT THORACENTESIS MEDICATIONS: 10 mL 1% lidocaine COMPLICATIONS: None immediate. PROCEDURE: An ultrasound guided thoracentesis was thoroughly discussed with the patient and questions answered. The benefits, risks, alternatives and complications were also discussed. The patient understands and wishes to proceed with the procedure. Written consent was obtained. Ultrasound was performed to localize and mark an  adequate pocket of fluid in the right chest. The area was then prepped and draped in the normal sterile fashion. 1% Lidocaine was used for local anesthesia. Under ultrasound guidance a 6 Fr Safe-T-Centesis catheter was introduced. Thoracentesis was performed. The catheter was removed and a dressing applied. FINDINGS: A total of approximately 600 mL of clear gold fluid was removed. Samples were sent to the laboratory as requested by the clinical team. IMPRESSION: Successful ultrasound guided right thoracentesis yielding 600 mL of pleural fluid. Read by: Earley Abide, PA-C Electronically Signed   By: Jerilynn Mages.  Shick M.D.   On: 10/28/2018 16:10   US Thoracentesis Asp Pleural Space W/img Guide  Result Date: 11/15/2018 INDICATION: Patient with history of metastatic breast cancer, dyspnea, bilateral pleural effusions L>R. Request is made for therapeutic left thoracentesis. EXAM: ULTRASOUND GUIDED THERAPEUTIC LEFT THORACENTESIS MEDICATIONS: 10 mL 1% lidocaine COMPLICATIONS: None immediate. PROCEDURE: An ultrasound guided thoracentesis was thoroughly discussed with the patient and questions answered. The benefits, risks, alternatives and complications were also discussed. The patient understands and wishes to proceed with the procedure. Written consent was obtained. Ultrasound was performed to localize and mark an adequate pocket of fluid in the left chest. The area was then prepped and draped in the normal sterile fashion. 1% Lidocaine was used for local anesthesia. Under ultrasound guidance a 6 Fr Safe-T-Centesis catheter was introduced. Thoracentesis was performed. The catheter was removed and a dressing applied. FINDINGS: A total of approximately 400 mL of dark red fluid was removed. IMPRESSION: Successful ultrasound guided left thoracentesis yielding 400 mL of pleural fluid. Read by: Earley Abide, PA-C Electronically Signed   By: Markus Daft M.D.   On: 11/15/2018 14:32    ASSESSMENT: 58 y.o. [place], [place] woman presenting 10/26/2018 with right-sided inflammatory breast cancer, stage IV, involving lungs, lymph nodes and bones, as follows:             (a) chest CT scan 10/26/2018 shows bilateral pleural effusions, possible lymphangitic spread of tumor, diffuse bony metastatic disease, and significant axillary mediastinal and hilar adenopathy             (b) bone scan 10/27/2018 is a "near SuperScan"  consistent with widespread bony metastatic disease             (c) head CT with and without contrast 10/30/2018 shows no intracranial metastatic disease, multiple calvarial lesions             (d) CA-27-29 on 10/27/2018 was 1810.4  (1) pleural fluid from right thoracentesis 10/28/2018 confirms malignant cells consistent with a breast primary, strongly estrogen and progesterone receptor positive, HER-2 not amplified, with an MIB-1 of 2%  (2) anastrozole started 10/29/2018             (a) palbociclib to start as outpatient             (b) denosumab/xgeva started 11/13/2018  (3) associated problems:             (a) hypoxia secondary to effusions             (b) pain from bone lesions             (c) right upper extremity lymphedema             (d) anemia, requiring transfusion             (e) poor venous access  (4) genetics testing pending  (5) hypocalcemia  (6) hypokalemia  (7) Anemia  (8) thrombocytopenia  (9) goals of care  PLAN:  Iris Is having worsening of her breathing. She really had no improvement following the thoracentesis on 11/15/2018. She is intermittently refusing BiPAP.  Recommend that she continue her anastrozole for now. According to telephone notes from the [facility], it does not appear as though she has started her Ibrance yet.   Her corrected calcium is 6.2.  Hypocalcemia likely related to her recent Xgeva injection which was given on 11/13/2018. Recommend to continue Calcium gluconate IV per hospitalist. She will need to start on oral calcium and vitamin  D while on Xgeva. I have ordered this to start today.  Replete potassium per hospitalist.  The patient's hemoglobin is 8.5.  She has not actively bleeding.  Recommend continued monitoring of her CBC and transfuse packed red blood cells if her hemoglobin is less than 7.0 or active bleeding.  No transfusion is indicated today.  She has mild thrombocytopenia likely related to her bone involvement of her cancer.  Recommend continued monitoring.  No transfusion is indicated.  The patient is undecided about CODE STATUS.  She is currently a full code.  Discussed with the palliative care team who will see the patient to have a goals of care discussion with her.   LOS: 3 days   Mikey Bussing, DNP, AGPCNP-BC, AOCNP 11/17/18    ADDENDUM: Kiylah's situation is complex. She has neglected this cancer, and her health in general, for many years. As far as the breast cancer is concerned, it is not curable but it is treatable and it is currently being treated with anti-estrogens and denosumab, with CDK 4,6 inhibitors to be added. Patients on this treatment frequently improve (for example, the effusions resolve) and control their cancer for an average of 2 years before needing to change to more aggressive therapy.  However, the patient has multiple comorbidities. If she is discharged in her current situation she will again become short of breath and her daughter will again call 911 and the patient will be re-admitted. Accordingly I recommend SNF placement until the patient's situation stabilizes sufficiently for her to be home.   If the patient decides on no treatment other than comfort care,  it is possible her prognosis would be short enough that she could be accepted at [facility] or other in-patient Hospice facility. However I do not believe she could be safe and stable at home even with Hospice support. I would anticipate readmission again.  I appreciate the efforts of the hospitalist, critical care and  palliative care teams on behalf of [facility]. Will continue to follow with you   Chauncey Cruel, MD Medical Oncology and Hematology [facility] [address] [place], [place] [address] Tel. [phone]    Fax. [phone]:

## 2018-11-17 NOTE — Progress Notes (Signed)
Pt seen, found on 2lnc.  HR 112, rr27, spo2 95%, bp 138/71.  Per RN, pt pulled off bipap mask and did not want it back on.  RN will attempt to place her back on bipap after a break.  RT will monitor and assist as needed.

## 2018-11-17 NOTE — Progress Notes (Signed)
Pt placed back on bipap

## 2018-11-17 NOTE — Progress Notes (Signed)
OT Cancellation Note  Patient Details Name: Suzanne [name] MRN: [id number] DOB: [birth year]   Cancelled Treatment:    Reason Eval/Treat Not Completed: Medical issues which prohibited therapy  Will check on pt next day  Kari Baars, [place] [phone] Office- [phone], Thereasa Parkin 11/17/2018, 3:25 PM

## 2018-11-18 ENCOUNTER — Encounter: Payer: Self-pay | Admitting: Licensed Clinical Social Worker

## 2018-11-18 ENCOUNTER — Inpatient Hospital Stay (HOSPITAL_COMMUNITY): Payer: Medicaid Other

## 2018-11-18 ENCOUNTER — Ambulatory Visit (HOSPITAL_COMMUNITY): Admission: RE | Admit: 2018-11-18 | Payer: Self-pay | Source: Ambulatory Visit

## 2018-11-18 DIAGNOSIS — Z515 Encounter for palliative care: Secondary | ICD-10-CM

## 2018-11-18 DIAGNOSIS — Z7189 Other specified counseling: Secondary | ICD-10-CM

## 2018-11-18 LAB — CALCIUM, IONIZED: Calcium, Ionized, Serum: 3 mg/dL — ABNORMAL LOW (ref 4.5–5.6)

## 2018-11-18 LAB — CBC WITH DIFFERENTIAL/PLATELET
Abs Immature Granulocytes: 0.35 10*3/uL — ABNORMAL HIGH (ref 0.00–0.07)
Basophils Absolute: 0 10*3/uL (ref 0.0–0.1)
Basophils Relative: 1 %
Eosinophils Absolute: 0.1 10*3/uL (ref 0.0–0.5)
Eosinophils Relative: 2 %
HCT: 27.7 % — ABNORMAL LOW (ref 36.0–46.0)
Hemoglobin: 9 g/dL — ABNORMAL LOW (ref 12.0–15.0)
Immature Granulocytes: 5 %
Lymphocytes Relative: 24 %
Lymphs Abs: 1.9 10*3/uL (ref 0.7–4.0)
MCH: 32.4 pg (ref 26.0–34.0)
MCHC: 32.5 g/dL (ref 30.0–36.0)
MCV: 99.6 fL (ref 80.0–100.0)
Monocytes Absolute: 0.2 10*3/uL (ref 0.1–1.0)
Monocytes Relative: 3 %
Neutro Abs: 5.2 10*3/uL (ref 1.7–7.7)
Neutrophils Relative %: 65 %
Platelets: 99 10*3/uL — ABNORMAL LOW (ref 150–400)
RBC: 2.78 MIL/uL — ABNORMAL LOW (ref 3.87–5.11)
RDW: 18.2 % — ABNORMAL HIGH (ref 11.5–15.5)
WBC: 7.8 10*3/uL (ref 4.0–10.5)
nRBC: 0.5 % — ABNORMAL HIGH (ref 0.0–0.2)

## 2018-11-18 LAB — PROCALCITONIN: Procalcitonin: 1.69 ng/mL

## 2018-11-18 LAB — COMPREHENSIVE METABOLIC PANEL
ALT: 15 U/L (ref 0–44)
AST: 22 U/L (ref 15–41)
Albumin: 2.6 g/dL — ABNORMAL LOW (ref 3.5–5.0)
Alkaline Phosphatase: 195 U/L — ABNORMAL HIGH (ref 38–126)
Anion gap: 17 — ABNORMAL HIGH (ref 5–15)
BUN: 14 mg/dL (ref 6–20)
CO2: 27 mmol/L (ref 22–32)
Calcium: 5.4 mg/dL — CL (ref 8.9–10.3)
Chloride: 96 mmol/L — ABNORMAL LOW (ref 98–111)
Creatinine, Ser: 0.68 mg/dL (ref 0.44–1.00)
GFR calc Af Amer: >60 mL/min (ref >60)
GFR calc non Af Amer: >60 mL/min (ref >60)
Glucose, Bld: 115 mg/dL — ABNORMAL HIGH (ref 70–99)
Potassium: 3.4 mmol/L — ABNORMAL LOW (ref 3.5–5.1)
Sodium: 140 mmol/L (ref 135–145)
Total Bilirubin: 0.8 mg/dL (ref 0.3–1.2)
Total Protein: 6.3 g/dL — ABNORMAL LOW (ref 6.5–8.1)

## 2018-11-18 LAB — PHOSPHORUS: Phosphorus: 1.8 mg/dL — ABNORMAL LOW (ref 2.5–4.6)

## 2018-11-18 LAB — MAGNESIUM: Magnesium: 2 mg/dL (ref 1.7–2.4)

## 2018-11-18 MED ORDER — POTASSIUM CHLORIDE CRYS ER 20 MEQ PO TBCR
30.0000 meq | EXTENDED_RELEASE_TABLET | Freq: Two times a day (BID) | ORAL | Status: AC
  Administered 2018-11-18 – 2018-11-19 (×2): 30 meq via ORAL
  Filled 2018-11-18 (×2): qty 1

## 2018-11-18 MED ORDER — CALCIUM GLUCONATE-NACL 1-0.675 GM/50ML-% IV SOLN
1.0000 g | Freq: Once | INTRAVENOUS | Status: AC
  Administered 2018-11-18: 1000 mg via INTRAVENOUS
  Filled 2018-11-18: qty 50

## 2018-11-18 MED ORDER — K PHOS MONO-SOD PHOS DI & MONO 155-852-130 MG PO TABS
500.0000 mg | ORAL_TABLET | Freq: Two times a day (BID) | ORAL | Status: DC
  Administered 2018-11-18: 500 mg via ORAL
  Filled 2018-11-18: qty 2

## 2018-11-18 NOTE — Progress Notes (Addendum)
NAME:  Suzanne [name], MRN:  [id number], DOB:  02-May-1961, LOS: 4 ADMISSION DATE:  11/14/2018, CONSULTATION DATE:  6/21 REFERRING MD:  Dr. Alfredia Ferguson, CHIEF COMPLAINT:     Brief History   58 y/o F admitted 6/19 with SOB / DOE.  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 Received PRBC on 6/18 and developed SOB / DOE after transfusion.   Past Medical History  Stage IV Breast Cancer  Malignant Bilateral Pleural Effusions Chronic Hypoxic Respiratory Failure  Significant Hospital Events   6/18 Blood transfusion with SOB, DOE after  6/19 Admit   Consults:  PCCM 6/21  Procedures:  Left-sided thoracentesis for 400 cc of dark red fluid  Significant Diagnostic Tests:  6/02 Right Pleural Fluid Cytology >> positive for malignant cells c/w breast cancer  6/20 Left Thoracentesis >> 400 ml dark red pleural fluid removed   Micro Data:  COVID 6/19 >> negative   Antimicrobials:  Cefepime 6/21  Zosyn 6/22 >>  Interim history/subjective:  Feels a little bit more comfortable today Mental status improved  Objective   Blood pressure 138/64, pulse 100, temperature 99 F (37.2 C), temperature source Oral, resp. rate (!) 30, height 5\' 2"  (1.575 m), weight 97.5 kg, last menstrual period 05/28/2013, SpO2 99 %.        Intake/Output Summary (Last 24 hours) at 11/18/2018 1045 Last data filed at 11/18/2018 0600 Gross per 24 hour  Intake 333.45 ml  Output 1550 ml  Net -1216.55 ml   Filed Weights   11/16/18 0627 11/16/18 1908 11/17/18 0421  Weight: 100.6 kg 98.3 kg 97.5 kg    Examination: Elderly lady, does appear short of breath with activity Pupils reactive S1-S2 appreciated Decreased air entry bilaterally Abdomen is soft bowel sounds appreciated Bilateral lower extremity edema Neurologically-alert and oriented x3, moving all extremities  CT scan of the chest reviewed-significant for extensive metastatic disease -Pleural effusion which is likely  loculated  Resolved Hospital Problem list      Assessment & Plan:    Acute on chronic hypoxic respiratory failure Titrate FiO2 to keep saturations greater than 90% Will require home oxygen BiPAP as needed for increased work of breathing ABG if any worsening of shortness of breath Risk of decompensation remains significant Patient remains a full code  Loculated pleural effusion -Malignant pleural effusion -Status post thoracentesis -May require a Pleurx catheter placement for management of effusion -Does not require the catheter at the present time  Stage IV breast cancer with malignant effusion -Oncology following -Palliative care involvement may be of benefit as she does have extensive, progressive disease  Patient with bilateral lower extremity edema -Recent echo with normal systolic function -BNP of 37.5 -Cautious diuresis -No DVT on lower extremity ultrasound  Will stop antibiotics after today's doses if no fever and remains clinically stable  Best practice:  Diet: Per primary  DVT prophylaxis: heparin  Mobility: As tolerated  Code Status: Full Code  Family Communication: Attempted to call patient's daughter [name] with no answer.  Message left for return call.  Disposition: SDU, per [facility]  Labs   CBC: Recent Labs  Lab 11/13/18 0941 11/14/18 1620 11/15/18 0543 11/16/18 0502 11/17/18 0215 11/18/18 0955  WBC 9.3 7.7 8.3 9.3 7.7 7.8  NEUTROABS 6.0 4.9  --  6.6 5.4 5.2  HGB 6.6* 9.5* 9.9* 9.0* 8.5* 9.0*  HCT 20.8* 30.7* 31.7* 28.9* 27.3* 27.7*  MCV 99.0 100.0 101.0* 100.0 102.6* 99.6  PLT 145* 131* 135* PLATELET CLUMPS NOTED ON  SMEAR, UNABLE TO ESTIMATE 101* 99*    Basic Metabolic Panel: Recent Labs  Lab 11/13/18 0941 11/14/18 1620 11/14/18 1621 11/15/18 0543 11/16/18 0502 11/17/18 0215  NA 133* 136  --  135 135 137  K 3.3* 3.2*  --  3.5 3.7 2.8*  CL 93* 95*  --  96* 94* 93*  CO2 31 31  --  30 29 30   GLUCOSE 135* 119*  --  155* 119* 117*  BUN 8  9  --  8 10 15   CREATININE 0.71 0.73  --  0.65 0.66 0.66  CALCIUM 7.6* 6.8*  --  6.4* 5.8* 5.2*  MG  --   --  2.4  --  1.9 2.2  PHOS  --   --  2.5  --  2.4* 2.3*   GFR: Estimated Creatinine Clearance: 82.6 mL/min (by C-G formula based on SCr of 0.66 mg/dL). Recent Labs  Lab 11/15/18 0543 11/16/18 0502 11/16/18 0910 11/16/18 1158 11/17/18 0215 11/18/18 0212 11/18/18 0955  PROCALCITON  --   --  1.02  --  2.30 1.69  --   WBC 8.3 9.3  --   --  7.7  --  7.8  LATICACIDVEN  --   --  1.5 1.3  --   --   --     Liver Function Tests: Recent Labs  Lab 11/13/18 0941 11/14/18 1620 11/15/18 0543 11/16/18 0502 11/17/18 0215  AST 23 26 36 31 22  ALT 14 17 22 18 16   ALKPHOS 329* 292* 295* 268* 208*  BILITOT 0.8 0.7 0.5 0.8 0.9  PROT 6.3* 6.3* 6.3* 5.9* 6.2*  ALBUMIN 1.9* 2.2* 2.1* 1.9* 2.3*   No results for input(s): LIPASE, AMYLASE in the last 168 hours. No results for input(s): AMMONIA in the last 168 hours.  ABG    Component Value Date/Time   PHART 7.496 (H) 11/16/2018 2330   PCO2ART 44.1 11/16/2018 2330   PO2ART 82.5 (L) 11/16/2018 2330   HCO3 33.3 (H) 11/16/2018 2330   O2SAT 96.4 11/16/2018 2330     Coagulation Profile: Recent Labs  Lab 11/15/18 0543  INR 1.2    Cardiac Enzymes: No results for input(s): CKTOTAL, CKMB, CKMBINDEX, TROPONINI in the last 168 hours.  HbA1C: Hgb A1c MFr Bld  Date/Time Value Ref Range Status  10/27/2018 08:59 AM 5.3 4.8 - 5.6 % Final    Comment:    (NOTE) Pre diabetes:          5.7%-6.4% Diabetes:              >6.4% Glycemic control for   <7.0% adults with diabetes     CBG: No results for input(s): GLUCAP in the last 168 hours.  The patient is critically ill with multiple organ system failure and requires high complexity decision making for assessment and support, frequent evaluation and titration of therapies, advanced monitoring, review of radiographic studies and interpretation of complex data.    Critical Care Time  devoted to patient care services, exclusive of separately billable procedures, described in this note is 30 minutes.  Sherrilyn Rist, MD [place], PCCM Cell: [phone]

## 2018-11-18 NOTE — Evaluation (Signed)
Physical Therapy Evaluation Patient Details Name: Suzanne [name] MRN: [id number] DOB: Jun 27, 1960 Today's Date: 11/18/2018   History of Present Illness  58 y.o. 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  Patient underwent blood transfusion 11/13/18 and subsequently had SOB, DOE and orthopnea assoicated with cough. Dx of recurrent L pleural effusion, new onset CHF.  Clinical Impression   The patient is mobilizing on 4 L [place], sats >94% o, HR 120's and RR 44 with activity.The patient reports that she had been limited in ambulation after DC. Appears more compromised today with breathing. Patient has fractured ribs onLeft and right clavicle fracture. Patient may benefit from [place]. Pt admitted with above diagnosis. Pt currently with functional limitations due to the deficits listed below (see PT Problem List). Pt will benefit from skilled PT to increase their independence and safety with mobility to allow discharge to the venue listed below.       Follow Up Recommendations Home health PT    Equipment Recommendations  None recommended by PT    Recommendations for Other Services       Precautions / Restrictions Precautions Type of Shoulder Precautions: possible right clavicle fx. Per MD noted stabilize RUE and refrain from ROM. Possible left side rib fracture      Mobility  Bed Mobility   Bed Mobility: Supine to Sit     Supine to sit: Min assist     General bed mobility comments: patiient performed activity with extra time, encouraged to move slowly  Transfers Overall transfer level: Needs assistance Equipment used: Rolling walker (2 wheeled) Transfers: Sit to/from [facility] Sit to Stand: Min assist Stand pivot transfers: Min assist       General transfer comment: steady assist to rise and take  afew steps to recliner, assist to back up to recliner..  Ambulation/Gait             General Gait Details: NT  Stairs            Wheelchair Mobility    Modified Rankin (Stroke Patients Only)       Balance Overall balance assessment: Needs assistance Sitting-balance support: Feet supported;Bilateral upper extremity supported Sitting balance-Leahy Scale: Good     Standing balance support: During functional activity;Bilateral upper extremity supported Standing balance-Leahy Scale: Fair                               Pertinent Vitals/Pain Faces Pain Scale: Hurts little more Pain Location: left side and left shoulder Pain Descriptors / Indicators: Aching Pain Intervention(s): Monitored during session;Limited activity within patient's tolerance    Home Living Family/patient expects to be discharged to:: Private residence Living Arrangements: Children Available Help at Discharge: Family;Available 24 hours/day Type of Home: House Home Access: Stairs to enter Entrance Stairs-Rails: Can reach both Entrance Stairs-Number of Steps: 6 Home Layout: One level Home Equipment: Walker - 2 wheels Additional Comments: lives with daughter whohas CP, sister can assist    Prior Function Level of Independence: Independent with assistive device(s)         Comments: limited amb due to SOB     Hand Dominance   Dominant Hand: Right    Extremity/Trunk Assessment                Communication  Communication: No difficulties  Cognition Arousal/Alertness: Awake/alert Behavior During Therapy: WFL for tasks assessed/performed;Anxious                                          General Comments      Exercises     Assessment/Plan    PT Assessment Patient needs continued PT services  PT Problem List Decreased strength;Decreased activity tolerance;Decreased balance;Decreased mobility;Cardiopulmonary status limiting activity       PT  Treatment Interventions DME instruction;Therapeutic exercise;Gait training;Balance training;Stair training;Neuromuscular re-education;Modalities;Functional mobility training;Therapeutic activities;Patient/family education    PT Goals (Current goals can be found in the Care Plan section)  Acute Rehab PT Goals Patient Stated Goal: to go home PT Goal Formulation: With patient Time For Goal Achievement: 12/02/18 Potential to Achieve Goals: Good    Frequency Min 3X/week   Barriers to discharge        Co-evaluation PT/OT/SLP Co-Evaluation/Treatment: Yes Reason for Co-Treatment: Complexity of the patient's impairments (multi-system involvement);For patient/therapist safety PT goals addressed during session: Mobility/safety with mobility         AM-PAC PT "6 Clicks" Mobility  Outcome Measure Help needed turning from your back to your side while in a flat bed without using bedrails?: A Little Help needed moving from lying on your back to sitting on the side of a flat bed without using bedrails?: A Little Help needed moving to and from a bed to a chair (including a wheelchair)?: A Little Help needed standing up from a chair using your arms (e.g., wheelchair or bedside chair)?: A Lot Help needed to walk in hospital room?: A Lot Help needed climbing 3-5 steps with a railing? : A Lot 6 Click Score: 15    End of Session Equipment Utilized During Treatment: Oxygen Activity Tolerance: Patient limited by fatigue Patient left: in chair;with call bell/phone within reach Nurse Communication: Mobility status PT Visit Diagnosis: Muscle weakness (generalized) (M62.81);Difficulty in walking, not elsewhere classified (R26.2)    Time: 9147-8295 PT Time Calculation (min) (ACUTE ONLY): 25 min   Charges:   PT Evaluation $PT Eval Low Complexity: [place] PT Acute Rehabilitation Services Pager [phone] Office [phone]   Claretha Cooper 11/18/2018, 2:35  PM

## 2018-11-18 NOTE — Evaluation (Signed)
Occupational Therapy Evaluation Patient Details Name: Suzanne [name] MRN: [id number] DOB: 28-Jul-1960 Today's Date: 11/18/2018    History of Present Illness 58 y.o. 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  Patient underwent blood transfusion 11/13/18 and subsequently had SOB, DOE and orthopnea assoicated with cough. Dx of recurrent L pleural effusion, new onset CHF.   Clinical Impression   This 58 y/o female presents with the above. PTA pt reports mod independence with ADL and functional mobility. Pt presenting with LUE/L side pain, generalized weakness and decreased activity tolerance. Pt performing functional transfers using RW with minA(+2 safety). She currently requires minA for self-feeding, seated grooming and UB ADL, at least modA for LB ADL. Pt on 4L O2 during session with O2 sats >90%, HR up to 121 and RR into the low 40s with minimal activity. Pt reports lives with daughter (who has CP) but may also have available assist from her sister at time of discharge. She will benefit from continued acute OT services and recommend follow up [place] services at time of discharge to maximize her safety and independence with ADL and mobility. Will follow.     Follow Up Recommendations  Home health OT;Supervision/Assistance - 24 hour(24hr initially)    Equipment Recommendations  (to be further assessed)           Precautions / Restrictions Precautions Precautions: Fall Type of Shoulder Precautions: possible right clavicle fx. Per MD noted stabilize RUE and refrain from ROM. Possible left side rib fracture      Mobility Bed Mobility Overal bed mobility: Needs Assistance Bed Mobility: Supine to Sit     Supine to sit: Min assist     General bed mobility comments: patiient performed activity with extra time, encouraged to  move slowly  Transfers Overall transfer level: Needs assistance Equipment used: Rolling walker (2 wheeled) Transfers: Sit to/from [facility] Sit to Stand: Min assist Stand pivot transfers: Min assist;+2 safety/equipment       General transfer comment: steady assist to rise and take afew steps to recliner, assist to back up to recliner..    Balance Overall balance assessment: Needs assistance Sitting-balance support: Feet supported;Bilateral upper extremity supported Sitting balance-Leahy Scale: Good     Standing balance support: During functional activity;Bilateral upper extremity supported Standing balance-Leahy Scale: Fair                             ADL either performed or assessed with clinical judgement   ADL Overall ADL's : Needs assistance/impaired Eating/Feeding: Set up;Sitting Eating/Feeding Details (indicate cue type and reason): setup for breakfast end of session, assist to open containers, etc due to LUE shoulder pain and decreased bil UE ROM Grooming: Set up;Minimal assistance;Sitting   Upper Body Bathing: Minimal assistance;Sitting   Lower Body Bathing: Moderate assistance;Sit to/from stand;+2 for safety/equipment Lower Body Bathing Details (indicate cue type and reason): minA standing balance Upper Body Dressing : Minimal assistance;Sitting   Lower Body Dressing: Moderate assistance;+2 for safety/equipment;Sit to/from stand Lower Body Dressing Details (indicate cue type and reason): minA standing balance Toilet Transfer: Minimal assistance;+2 for physical assistance;+2 for safety/equipment;Stand-pivot;RW Toilet Transfer Details (indicate cue type and reason): simulated via transfer to [place] and Hygiene: Moderate assistance;+2 for safety/equipment;Sit to/from stand       Functional mobility during  ADLs: Minimal assistance;+2 for safety/equipment;Rolling walker General ADL Comments: pt limited  due to pain, weakness                         Pertinent Vitals/Pain Pain Assessment: Faces Faces Pain Scale: Hurts little more Pain Location: left side and left shoulder Pain Descriptors / Indicators: Aching Pain Intervention(s): Monitored during session;Limited activity within patient's tolerance     Hand Dominance Right   Extremity/Trunk Assessment Upper Extremity Assessment Upper Extremity Assessment: LUE deficits/detail;RUE deficits/detail RUE Deficits / Details: per recent notes pt with possible R clavicle fracture, pt reports minimal pain at this time in RUE, reports increased pain in LUE, edemous hands RUE Coordination: decreased gross motor LUE Deficits / Details: decreased ROM due to L shoulder pain, edemous hands LUE: Unable to fully assess due to pain LUE Coordination: decreased gross motor   Lower Extremity Assessment Lower Extremity Assessment: Defer to PT evaluation   Cervical / Trunk Assessment Cervical / Trunk Assessment: Kyphotic   Communication Communication Communication: No difficulties   Cognition Arousal/Alertness: Awake/alert Behavior During Therapy: WFL for tasks assessed/performed;Anxious Overall Cognitive Status: Within Functional Limits for tasks assessed                                     General Comments       Exercises     Shoulder Instructions      Home Living Family/patient expects to be discharged to:: Private residence Living Arrangements: Children Available Help at Discharge: Family;Available 24 hours/day Type of Home: House Home Access: Stairs to enter [employer] of Steps: 6 Entrance Stairs-Rails: Can reach both Home Layout: One level     Bathroom Shower/Tub: Teacher, early years/pre: Standard     Home Equipment: Environmental consultant - 2 wheels   Additional Comments: lives with daughter whohas CP, sister can assist      Prior Functioning/Environment Level of Independence: Independent  with assistive device(s)        Comments: limited amb due to SOB        OT Problem List: Decreased strength;Decreased range of motion;Decreased activity tolerance;Impaired balance (sitting and/or standing);Obesity;Impaired UE functional use;Pain;Cardiopulmonary status limiting activity;Decreased knowledge of precautions;Decreased knowledge of use of DME or AE;Decreased coordination;Increased edema      OT Treatment/Interventions: Self-care/ADL training;Therapeutic exercise;Neuromuscular education;DME and/or AE instruction;Therapeutic activities;Patient/family education;Balance training    OT Goals(Current goals can be found in the care plan section) Acute Rehab OT Goals Patient Stated Goal: to go home OT Goal Formulation: With patient Time For Goal Achievement: 12/02/18 Potential to Achieve Goals: Good  OT Frequency: Min 2X/week   Barriers to D/C:            Co-evaluation PT/OT/SLP Co-Evaluation/Treatment: Yes Reason for Co-Treatment: Complexity of the patient's impairments (multi-system involvement);For patient/therapist safety PT goals addressed during session: Mobility/safety with mobility OT goals addressed during session: ADL's and self-care      AM-PAC OT "6 Clicks" Daily Activity     Outcome Measure Help from another person eating meals?: A Little Help from another person taking care of personal grooming?: A Little Help from another person toileting, which includes using toliet, bedpan, or urinal?: A Lot Help from another person bathing (including washing, rinsing, drying)?: A Lot Help from another person to put on and taking off regular upper body clothing?: A Little Help from another person to put on and taking off  regular lower body clothing?: A Lot 6 Click Score: 15   End of Session Equipment Utilized During Treatment: Rolling walker;Oxygen Nurse Communication: Mobility status  Activity Tolerance: Patient tolerated treatment well Patient left: in chair;with  call bell/phone within reach;with chair alarm set  OT Visit Diagnosis: Muscle weakness (generalized) (M62.81);Unsteadiness on feet (R26.81)                Time: 1735-6701 OT Time Calculation (min): 25 min Charges:  OT General Charges $OT Visit: 1 Visit OT Evaluation $OT Eval Moderate Complexity: [place], OT [employer] Pager [phone] Office [phone]   Raymondo Band 11/18/2018, 3:54 PM

## 2018-11-18 NOTE — Progress Notes (Signed)
HEMATOLOGY-ONCOLOGY PROGRESS NOTE  SUBJECTIVE: Thinks her shortness of breath has somewhat improved.  Reports that she was very afraid of the mask from her BiPAP.  Was tearful when talking about this.  Reports that her lower extremity discoloration has improved.  She has no other specific complaints today.  REVIEW OF SYSTEMS:   Constitutional: Denies fevers, chills Eyes: Denies blurriness of vision Ears, nose, mouth, throat, and face: Denies mucositis or sore throat Respiratory: Reports shortness of breath, somewhat improved Cardiovascular: Denies palpitation, chest discomfort Gastrointestinal:  Denies nausea, heartburn or change in bowel habits Skin: Has rash to bilateral feet.  Lymphatics: Denies new lymphadenopathy or easy bruising Neurological:Denies numbness, tingling or new weaknesses Behavioral/Psych: Reports being tearful.   Extremities: Has swelling and discoloration in her LE. All other systems were reviewed with the patient and are negative.  I have reviewed the past medical history, past surgical history, social history and family history with the patient and they are unchanged from previous note.   PHYSICAL EXAMINATION:  Vitals:   11/18/18 0900 11/18/18 1205  BP: 138/64   Pulse: 100   Resp: (!) 30   Temp:  98.3 F (36.8 C)  SpO2: 99%    Filed Weights   11/16/18 0627 11/16/18 1908 11/17/18 0421  Weight: 221 lb 12.5 oz (100.6 kg) 216 lb 11.4 oz (98.3 kg) 214 lb 15.2 oz (97.5 kg)    Intake/Output from previous day: 06/22 0701 - 06/23 0700 In: 428.3 [P.O.:120; IV Piggyback:308.3] Out: 1550 [Urine:1550]  GENERAL:alert, distress SKIN: Discoloration of her bilateral feet. EYES: normal, Conjunctiva are pink and non-injected, sclera clear OROPHARYNX:no exudate, no erythema and lips, buccal mucosa, and tongue normal  NECK: supple, thyroid normal size, non-tender, without nodularity LYMPH:  no palpable lymphadenopathy in the cervical, axillary or inguinal LUNGS:  Tachypneic.  Diminished bilaterally. HEART: regular rate & rhythm and no murmurs.  1-2+ bilateral lower extremity edema. ABDOMEN:abdomen soft, non-tender and normal bowel sounds Musculoskeletal:no cyanosis of digits and no clubbing  NEURO: alert & oriented x 3 with fluent speech, no focal motor/sensory deficits. Anxious and tearful at times.  LABORATORY DATA:  I have reviewed the data as listed CMP Latest Ref Rng & Units 11/18/2018 11/17/2018 11/16/2018  Glucose 70 - 99 mg/dL 115(H) 117(H) 119(H)  BUN 6 - 20 mg/dL _0 Creatinine 0.44 - 1.00 mg/dL 0.68 0.66 0.66  Sodium 135 - 145 mmol/L 140 137 135  Potassium 3.5 - 5.1 mmol/L 3.4(L) 2.8(L) 3.7  Chloride 98 - 111 mmol/L 96(L) 93(L) 94(L)  CO2 22 - 32 mmol/L _1 Calcium 8.9 - 10.3 mg/dL 5.4(LL) 5.2(LL) 5.8(LL)  Total Protein 6.5 - 8.1 g/dL 6.3(L) 6.2(L) 5.9(L)  Total Bilirubin 0.3 - 1.2 mg/dL 0.8 0.9 0.8  Alkaline Phos 38 - 126 U/L 195(H) 208(H) 268(H)  AST 15 - 41 U/L _2 ALT 0 - 44 U/L _3 Lab Results  Component Value Date   WBC 7.8 11/18/2018   HGB 9.0 (L) 11/18/2018   HCT 27.7 (L) 11/18/2018   MCV 99.6 11/18/2018   PLT 99 (L) 11/18/2018   NEUTROABS 5.2 11/18/2018    Dg Chest 1 View  Result Date: 11/15/2018 CLINICAL DATA:  Post left-sided thoracentesis. EXAM: CHEST  1 VIEW COMPARISON:  11/14/2018 FINDINGS: Lungs are hypoinflated demonstrate interval improvement with moderate residual left-sided pleural effusion likely with associated basilar atelectasis. No pneumothorax. Mild prominence of the perihilar markings without significant change. Cardiomediastinal silhouette and remainder of  the exam is unchanged. IMPRESSION: Hypoinflation with interval improvement in left pleural effusion post thoracentesis. No pneumothorax. Moderate residual left effusion likely with associated basilar atelectasis. Prominence of the perihilar markings suggesting vascular congestion. Electronically Signed   By: Marin Olp M.D.    On: 11/15/2018 13:58   Dg Chest 1 View  Result Date: 10/29/2018 CLINICAL DATA:  Post LEFT thoracentesis EXAM: CHEST  1 VIEW COMPARISON:  622 in 20 FINDINGS: Upper normal heart size. Stable mediastinal contours. BILATERAL upper lobe scarring. Improved aeration at bases since prior study. Minimal residual LEFT pleural effusion without pneumothorax. Bones demineralized. IMPRESSION: Extensive chronic lung changes. No pneumothorax following LEFT thoracentesis. Electronically Signed   By: Lavonia Dana M.D.   On: 10/29/2018 15:50   Dg Chest 1 View  Result Date: 10/28/2018 CLINICAL DATA:  Post right thoracentesis EXAM: CHEST  1 VIEW COMPARISON:  10/28/2018 FINDINGS: Very low lung volumes again noted. Interstitial prominence within the lungs. Heart is borderline in size with vascular congestion. No pneumothorax following right thoracentesis. Diffuse skeletal metastases again noted. IMPRESSION: Very low lung volumes with cardiomegaly, vascular congestion and interstitial prominence. No pneumothorax following thoracentesis. Electronically Signed   By: Rolm Baptise M.D.   On: 10/28/2018 16:00   Dg Chest 2 View  Result Date: 11/14/2018 CLINICAL DATA:  Stage IV breast cancer.  Shortness of breath. EXAM: CHEST - 2 VIEW COMPARISON:  Radiographs 11/13/2018 and 10/29/2018.  CT 10/26/2018. FINDINGS: The heart size and mediastinal contours are stable. Left pleural effusion and left basilar opacity are unchanged from the recent study. Diffusely increased interstitial markings are again noted throughout both lungs, remaining suspicious for lymphangitic spread of tumor based on previous CT. No progressive airspace opacities identified. Diffuse osseous metastatic disease noted without evidence of acute fracture. IMPRESSION: Stable radiographic appearance of the chest since yesterday. A left pleural effusion and left basilar pulmonary opacity have increased from older prior studies. Evidence of extensive metastatic disease again  noted. Electronically Signed   By: Richardean Sale M.D.   On: 11/14/2018 17:10   Dg Chest 2 View  Result Date: 11/13/2018 CLINICAL DATA:  Shortness of breath, weakness. Cancer patient. EXAM: CHEST - 2 VIEW COMPARISON:  Chest x-ray dated 10/29/2018. Chest CT dated 10/26/2018. FINDINGS: Heart size and mediastinal contours appear stable. Increased opacity at the LEFT lung base, likely increased pleural effusion. There is also central pulmonary vascular congestion and bilateral perihilar edema. No pneumothorax seen. Diffuse osseous metastases, as described on previous CT report. IMPRESSION: 1. Increased opacity at the LEFT lung base, likely increased pleural effusion. Superimposed pneumonia or airspace collapse not excluded. 2. Central pulmonary vascular congestion and bilateral perihilar edema indicating CHF/volume overload. 3. Diffuse osseous metastases. Electronically Signed   By: Franki Cabot M.D.   On: 11/13/2018 12:32   Ct Head W & Wo Contrast  Result Date: 10/30/2018 CLINICAL DATA:  Metastatic breast cancer. No neurologic symptoms are reported. EXAM: CT HEAD WITHOUT AND WITH CONTRAST TECHNIQUE: Contiguous axial images were obtained from the base of the skull through the vertex without and with intravenous contrast CONTRAST:  60m ISOVUE-300 IOPAMIDOL (ISOVUE-300) INJECTION 61% COMPARISON:  Nuclear medicine whole-body bone scan, 10/27/2018 FINDINGS: Brain: No acute stroke, hemorrhage, mass lesion or hydrocephalus. Mild atrophy. No extra-axial fluid. Post infusion, no abnormal enhancement of the brain or visible meninges. Vascular: No hyperdense vessel or unexpected calcification. Visible vessels are patent. Skull: Widespread metastatic disease to the calvarium, skull base, and visible cervical spine. Metastatic disease to the mandible is also present.  There is destruction of the inner and outer table of the skull in multiple locations. Epidural tumor is not established. Sinuses/Orbits: No acute finding.  Other: None. IMPRESSION: 1. Widespread osseous metastatic disease to the calvarium, skull base, and visible cervical spine. Epidural tumor not established. 2. No intracranial metastatic disease is evident. Electronically Signed   By: Staci Righter M.D.   On: 10/30/2018 09:50   Ct Chest Wo Contrast  Result Date: 11/17/2018 CLINICAL DATA:  58 year old female Caucasian female nonsmoker with history of bilateral lower extremity edema. Possible stage IV breast cancer. EXAM: CT CHEST WITHOUT CONTRAST TECHNIQUE: Multidetector CT imaging of the chest was performed following the standard protocol without IV contrast. COMPARISON:  Chest CT 10/26/2018. FINDINGS: Cardiovascular: Heart size is normal. There is no significant pericardial fluid, thickening or pericardial calcification. There is aortic atherosclerosis, as well as atherosclerosis of the great vessels of the mediastinum and the coronary arteries, including calcified atherosclerotic plaque in the left anterior descending and right coronary arteries. Mediastinum/Nodes: No pathologically enlarged mediastinal or hilar lymph nodes. Please note that accurate exclusion of hilar adenopathy is limited on noncontrast CT scans. Esophagus is unremarkable in appearance. Multiple borderline enlarged and mildly enlarged bilateral axillary and subpectoral lymph nodes (right greater than left) measuring up to 15 mm in short axis on the right (axial image 49 of series 2). Lungs/Pleura: Extensive septal thickening with band like opacities throughout both lungs, similar to the prior study. No discrete pulmonary nodules or masses are confidently identified. Moderate left and small right pleural effusions. The left pleural effusion is predominantly sub pulmonic in position. Upper Abdomen: Several peripherally calcified gallstones lying dependently in the gallbladder measuring up to 1.9 cm in diameter. Aortic atherosclerosis. Musculoskeletal: Right breast is incompletely imaged, but  there appears to be irregular soft tissue infiltration throughout much of the right breast tissue, which could reflect tumoral in for tray shin and/or edema. Diffuse lytic and sclerotic lesions throughout the visualized axial and appendicular skeleton, indicative of widespread metastatic disease to the bones. With appears very similar to the prior examination. There continue to be multiple nondisplaced to minimally displaced pathologic fractures throughout the ribs bilaterally. In addition, there is a new widely displaced fracture of the medial aspect of the right clavicle with approximately 1.4 cm of lateral and anterior displacement, as well as high attenuation thickening of the adjacent musculature in the upper right pectoralis major and lower right sternocleidomastoid muscles, most compatible with intramuscular hematoma and/or tumor. IMPRESSION: 1. Widespread metastatic disease to the bones redemonstrated, with multiple nondisplaced minimally displaced bilateral rib fractures, as well as a new widely displaced fracture of the medial aspect of the right clavicle with probable adjacent intramuscular hemorrhage in the lower right sternocleidomastoid muscle and upper right pectoralis major muscle. Probable tumoral infiltration throughout the right breast, as well as bilateral axillary and subpectoral lymphadenopathy (right greater than left). 2. Unusual appearance of the lungs, similar to the prior study, which could suggest chronic systemic disease such as sarcoidosis or interstitial lung disease. The possibility of lymphangitic spread of tumor in the lungs could be considered, but is not strongly favored at this time. 3. Moderate left (predominantly subpulmonic) pleural effusion and small right pleural effusion. 4. Cholelithiasis. 5. Aortic atherosclerosis, in addition to 2 vessel coronary artery disease. Aortic Atherosclerosis (ICD10-I70.0). Electronically Signed   By: Vinnie Langton M.D.   On: 11/17/2018 14:55    Ct Chest W Contrast  Result Date: 10/26/2018 CLINICAL DATA:  Shortness of breath, possible metastatic disease.  Breast cancer. EXAM: CT CHEST WITH CONTRAST TECHNIQUE: Multidetector CT imaging of the chest was performed during intravenous contrast administration. CONTRAST:  5m OMNIPAQUE IOHEXOL 300 MG/ML  SOLN COMPARISON:  Chest x-ray earlier today FINDINGS: Cardiovascular: Mild cardiomegaly. Moderate coronary artery calcifications in the left anterior descending coronary artery. Aorta is normal caliber. Scattered atherosclerotic change. Mediastinum/Nodes: Numerous borderline sized and mildly enlarged mediastinal lymph nodes. Right paratracheal node measures 12 mm. Borderline size bilateral hilar lymph nodes, the largest on the right measuring 11 mm in short axis diameter. Numerous enlarged bilateral axillary lymph nodes with index left axillary lymph node having a short axis diameter of 2.1 cm on image 21 and right axillary lymph node having a short axis diameter of 1.4 cm on image 48. Lungs/Pleura: Moderate bilateral pleural effusions. Extensive interstitial disease throughout the lungs bilaterally. Mild reticulonodular densities. Cannot exclude lymphangitic spread of tumor. Upper Abdomen: Gallstones noted within the gallbladder. Musculoskeletal: Chest wall soft tissues are unremarkable. Extensive diffuse mixed lytic and sclerotic lesions throughout the visualized skeleton compatible with diffuse skeletal metastases. IMPRESSION: Bilateral axillary and mediastinal adenopathy. Borderline hilar lymph nodes bilaterally. Findings likely reflect metastatic lymphadenopathy. Moderate bilateral pleural effusions. Extensive interstitial thickening and reticulonodular opacities throughout the lungs. Cannot exclude lymphangitic spread of tumor. Diffuse skeletal metastases. Cholelithiasis. Coronary artery disease. Aortic Atherosclerosis (ICD10-I70.0). Electronically Signed   By: KRolm BaptiseM.D.   On: 10/26/2018 22:54    Nm Bone Scan Whole Body  Result Date: 10/27/2018 CLINICAL DATA:  Breast cancer staging. EXAM: NUCLEAR MEDICINE WHOLE BODY BONE SCAN TECHNIQUE: Whole body anterior and posterior images were obtained approximately 3 hours after intravenous injection of radiopharmaceutical. RADIOPHARMACEUTICALS:  20.6 mCi Technetium-963mDP IV COMPARISON:  CT scan Oct 26, 2018 FINDINGS: Bony metastatic disease is seen in both of the femurs, numerous ribs, the manubrium, and right humerus. The widespread bony metastatic disease in the scapula, thoracic spine, sternum, and proximal humeri is better appreciated on the comparison CT scan. IMPRESSION: Widespread bony metastatic disease. The extent of metastatic disease is actually better appreciated on the recent CT imaging. This study is a near super scan consistent with widespread bony metastatic disease. Electronically Signed   By: DaDorise BullionII M.D   On: 10/27/2018 16:01   Dg Chest Port 1 View  Result Date: 11/18/2018 CLINICAL DATA:  Shortness of breath and increased weakness this morning; history metastatic breast cancer EXAM: PORTABLE CHEST 1 VIEW COMPARISON:  Portable exam 0823 hours compared to 11/17/2018 FINDINGS: Very low lung volumes. Normal heart size mediastinal contours. Patchy opacities in the lungs bilaterally favoring pneumonia over atelectasis. No pleural effusion or pneumothorax. Patchy osteosclerosis consistent with osseous metastatic disease. IMPRESSION: BILATERAL pulmonary opacities likely representing multifocal pneumonia. Electronically Signed   By: MaLavonia Dana.D.   On: 11/18/2018 09:30   Dg Chest Port 1 View  Result Date: 11/17/2018 CLINICAL DATA:  Shortness of breath.  Metastatic breast cancer. EXAM: PORTABLE CHEST 1 VIEW COMPARISON:  11/16/2018.  CT 10/26/2018. FINDINGS: Mediastinum stable. Heart size normal. Persistent left base atelectasis infiltrate. Right base atelectasis/infiltrate also noted on today's exam. Diffuse bilateral  interstitial prominence again noted. Tiny left pleural effusion again noted. No pneumothorax. Multiple lytic lesions noted throughout the visualized bony structures. IMPRESSION: 1. Persistent left base atelectasis/infiltrate. Right base atelectasis/infiltrate also noted on today's exam. Small left pleural effusion again noted. 2. Diffuse bilateral interstitial prominence again noted consistent with interstitial tumor spread this patient with known metastatic disease. 3. Multiple lytic lesions are again noted throughout the visualized  bony structures consistent with metastatic disease. Electronically Signed   By: Marcello Moores  Register   On: 11/17/2018 06:24   Dg Chest Port 1 View  Result Date: 11/16/2018 CLINICAL DATA:  Dyspnea, metastatic breast cancer EXAM: PORTABLE CHEST 1 VIEW COMPARISON:  Chest radiograph from one day prior. FINDINGS: Low lung volumes. Stable cardiomediastinal silhouette with mild cardiomegaly. No pneumothorax. Stable small bilateral pleural effusions. Stable patchy left lung base consolidation. Stable thick irregular reticular opacities in the upper lungs. IMPRESSION: 1. Stable low lung volumes with small bilateral pleural effusions. 2. Stable thick irregular reticular opacities in the upper lungs and patchy left lung base consolidation. Findings likely representing combination of lymphangitic tumor (as seen on recent chest CT) and atelectasis. A component of mild pulmonary edema cannot be excluded. Electronically Signed   By: Ilona Sorrel M.D.   On: 11/16/2018 07:13   Dg Chest Port 1 View  Result Date: 10/28/2018 CLINICAL DATA:  Bone injury EXAM: PORTABLE CHEST 1 VIEW COMPARISON:  10/26/2018 FINDINGS: Diffuse widespread metastatic disease is again noted involving the osseous structures. There is an acute appearing fracture involving the fourth rib posterolaterally on the left. There are likely additional left-sided rib fractures that are suboptimally evaluated on this exam. The lung volumes  are low. The cardiac silhouette is enlarged. There are growing bilateral pleural effusions and prominent bilateral interstitial lung markings. Bibasilar airspace opacities are noted. IMPRESSION: 1. Acute left-sided rib fracture involving the fourth rib posterolaterally. No pneumothorax. There are likely additional adjacent fractures that are suboptimally evaluated on this exam. 2. Low lung volumes. 3. Cardiomegaly with growing bilateral pleural effusions and prominent interstitial lung markings suggestive of progressive pulmonary edema. Electronically Signed   By: Constance Holster M.D.   On: 10/28/2018 01:55   Dg Chest Portable 1 View  Result Date: 10/26/2018 CLINICAL DATA:  Shortness of breath, possible right breast cancer EXAM: PORTABLE CHEST 1 VIEW COMPARISON:  None. FINDINGS: Low lung volumes. No pleural effusion. Diffuse interstitial opacity. Heart size upper limits of normal. No pneumothorax. Diffuse sclerosis and lucent lesions within the clavicles, ribs and shoulders. IMPRESSION: 1. Low lung volumes without consolidation or effusion. Diffuse bilateral interstitial opacity of unknown chronicity. Findings could be secondary to interstitial inflammatory process, atypical/viral pneumonia, or possible metastatic disease given clinical history. CT chest suggested for further evaluation 2. 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   On: 10/26/2018 20:11   Dg Abd 2 Views  Result Date: 11/13/2018 CLINICAL DATA:  Pt c/o sob, weakness, cancer patient. EXAM: ABDOMEN - 2 VIEW COMPARISON:  None. FINDINGS: Overall visualized bowel gas pattern is nonobstructive. Moderate amount of stool and gas throughout the colon. No evidence of free intraperitoneal air seen. Diffuse osseous metastases, as previously described. IMPRESSION: 1. Nonobstructive bowel gas pattern. Moderate amount of  stool and gas throughout the colon. 2. Diffuse osseous metastases. Electronically Signed   By: Franki Cabot M.D.   On: 11/13/2018 12:33   Vas [place] Lower Extremity Venous (dvt)  Result Date: 11/18/2018  Lower Venous Study Indications: Swelling.  Risk Factors: Cancer. Limitations: Body habitus and poor ultrasound/tissue interface. Comparison Study: No prior studies. Performing Technologist: Oliver Hum RVT  Examination Guidelines: A complete evaluation includes B-mode imaging, spectral Doppler, color Doppler, and power Doppler as needed of all accessible portions of each vessel. Bilateral testing is considered an integral part of a complete examination. Limited examinations for reoccurring indications  may be performed as noted.  +---------+---------------+---------+-----------+----------+-------+ RIGHT    CompressibilityPhasicitySpontaneityPropertiesSummary +---------+---------------+---------+-----------+----------+-------+ CFV      Full           Yes      Yes                          +---------+---------------+---------+-----------+----------+-------+ SFJ      Full                                                 +---------+---------------+---------+-----------+----------+-------+ FV Prox  Full                                                 +---------+---------------+---------+-----------+----------+-------+ FV Mid   Full                                                 +---------+---------------+---------+-----------+----------+-------+ FV DistalFull                                                 +---------+---------------+---------+-----------+----------+-------+ PFV      Full                                                 +---------+---------------+---------+-----------+----------+-------+ POP      Full           Yes      Yes                          +---------+---------------+---------+-----------+----------+-------+ PTV      Full                                                  +---------+---------------+---------+-----------+----------+-------+ PERO     Full                                                 +---------+---------------+---------+-----------+----------+-------+   +---------+---------------+---------+-----------+----------+-------+ LEFT     CompressibilityPhasicitySpontaneityPropertiesSummary +---------+---------------+---------+-----------+----------+-------+ CFV      Full           Yes      Yes                          +---------+---------------+---------+-----------+----------+-------+ SFJ      Full                                                 +---------+---------------+---------+-----------+----------+-------+  FV Prox  Full                                                 +---------+---------------+---------+-----------+----------+-------+ FV Mid   Full                                                 +---------+---------------+---------+-----------+----------+-------+ FV DistalFull                                                 +---------+---------------+---------+-----------+----------+-------+ PFV      Full                                                 +---------+---------------+---------+-----------+----------+-------+ POP      Full           Yes      Yes                          +---------+---------------+---------+-----------+----------+-------+ PTV      Full                                                 +---------+---------------+---------+-----------+----------+-------+ PERO     Full                                                 +---------+---------------+---------+-----------+----------+-------+     Summary: Right: There is no evidence of deep vein thrombosis in the lower extremity. No cystic structure found in the popliteal fossa. Left: There is no evidence of deep vein thrombosis in the lower extremity. No cystic structure found in the  popliteal fossa.  *See table(s) above for measurements and observations. Electronically signed by Deitra Mayo MD on 11/18/2018 at 11:42:50 AM.    Final    Ir Thoracentesis Asp Pleural Space W/img Guide  Result Date: 10/29/2018 INDICATION: Patient with history of metastatic disease on imaging, dyspnea, and bilateral pleural effusions. Request is made for therapeutic left thoracentesis. EXAM: ULTRASOUND GUIDED THERAPEUTIC LEFT THORACENTESIS MEDICATIONS: 10 mL 1% lidocaine COMPLICATIONS: None immediate. PROCEDURE: An ultrasound guided thoracentesis was thoroughly discussed with the patient and questions answered. The benefits, risks, alternatives and complications were also discussed. The patient understands and wishes to proceed with the procedure. Written consent was obtained. Ultrasound was performed to localize and mark an adequate pocket of fluid in the left chest. The area was then prepped and draped in the normal sterile fashion. 1% Lidocaine was used for local anesthesia. Under ultrasound guidance a 6 Fr Safe-T-Centesis catheter was introduced. Thoracentesis was performed. The catheter was removed and a dressing applied. FINDINGS: A total of approximately 350 mL of hazy gold fluid was  removed. IMPRESSION: Successful ultrasound guided left thoracentesis yielding 350 mL of pleural fluid. Read by: Earley Abide, PA-C Electronically Signed   By: Jerilynn Mages.  Shick M.D.   On: 10/29/2018 15:58   Ir Thoracentesis Asp Pleural Space W/img Guide  Result Date: 10/28/2018 INDICATION: Patient with history of metastatic disease (including bone metastases) on imaging, dyspnea, and bilateral pleural effusions (R>L). Request is made for diagnostic and therapeutic right thoracentesis. EXAM: ULTRASOUND GUIDED DIAGNOSTIC AND THERAPEUTIC RIGHT THORACENTESIS MEDICATIONS: 10 mL 1% lidocaine COMPLICATIONS: None immediate. PROCEDURE: An ultrasound guided thoracentesis was thoroughly discussed with the patient and questions  answered. The benefits, risks, alternatives and complications were also discussed. The patient understands and wishes to proceed with the procedure. Written consent was obtained. Ultrasound was performed to localize and mark an adequate pocket of fluid in the right chest. The area was then prepped and draped in the normal sterile fashion. 1% Lidocaine was used for local anesthesia. Under ultrasound guidance a 6 Fr Safe-T-Centesis catheter was introduced. Thoracentesis was performed. The catheter was removed and a dressing applied. FINDINGS: A total of approximately 600 mL of clear gold fluid was removed. Samples were sent to the laboratory as requested by the clinical team. IMPRESSION: Successful ultrasound guided right thoracentesis yielding 600 mL of pleural fluid. Read by: Earley Abide, PA-C Electronically Signed   By: Jerilynn Mages.  Shick M.D.   On: 10/28/2018 16:10   US Thoracentesis Asp Pleural Space W/img Guide  Result Date: 11/15/2018 INDICATION: Patient with history of metastatic breast cancer, dyspnea, bilateral pleural effusions L>R. Request is made for therapeutic left thoracentesis. EXAM: ULTRASOUND GUIDED THERAPEUTIC LEFT THORACENTESIS MEDICATIONS: 10 mL 1% lidocaine COMPLICATIONS: None immediate. PROCEDURE: An ultrasound guided thoracentesis was thoroughly discussed with the patient and questions answered. The benefits, risks, alternatives and complications were also discussed. The patient understands and wishes to proceed with the procedure. Written consent was obtained. Ultrasound was performed to localize and mark an adequate pocket of fluid in the left chest. The area was then prepped and draped in the normal sterile fashion. 1% Lidocaine was used for local anesthesia. Under ultrasound guidance a 6 Fr Safe-T-Centesis catheter was introduced. Thoracentesis was performed. The catheter was removed and a dressing applied. FINDINGS: A total of approximately 400 mL of dark red fluid was removed. IMPRESSION:  Successful ultrasound guided left thoracentesis yielding 400 mL of pleural fluid. Read by: Earley Abide, PA-C Electronically Signed   By: Markus Daft M.D.   On: 11/15/2018 14:32    ASSESSMENT: 58 y.o. [place], [place] woman presenting 10/26/2018 with right-sided inflammatory breast cancer, stage IV, involving lungs, lymph nodes and bones, as follows:             (a) chest CT scan 10/26/2018 shows bilateral pleural effusions, possible lymphangitic spread of tumor, diffuse bony metastatic disease, and significant axillary mediastinal and hilar adenopathy             (b) bone scan 10/27/2018 is a "near [place]" consistent with widespread bony metastatic disease             (c) head CT with and without contrast 10/30/2018 shows no intracranial metastatic disease, multiple calvarial lesions             (d) CA-27-29 on 10/27/2018 was 1810.4  (1) pleural fluid from right thoracentesis 10/28/2018 confirms malignant cells consistent with a breast primary, strongly estrogen and progesterone receptor positive, HER-2 not amplified, with an MIB-1 of 2%  (2) anastrozole started 10/29/2018             (  a) palbociclib to start as outpatient             (b) denosumab/xgeva started 11/13/2018  (3) associated problems:             (a) hypoxia secondary to effusions             (b) pain from bone lesions             (c) right upper extremity lymphedema             (d) anemia, requiring transfusion             (e) poor venous access  (4) genetics testing pending  (5) hypocalcemia  (6) hypokalemia  (7) Anemia  (8) thrombocytopenia  (9) goals of care  PLAN:  Franziska is still having some shortness of breath but reports that this is improving.  Using BiPAP intermittently.  She has not received much in terms of treatment for breast cancer standpoint.  Recommend she continue her anastrozole.  She has not yet started Plessis, but once her clinical status improves, we will start this.  She has  a high risk for recurrent hospitalization and recommend consideration of skilled nursing facility placement after discharge.    Corrected calcium remains low.  Continue management per hospitalist..  Hypocalcemia likely related to her recent Xgeva injection which was given on 11/13/2018.  Continue oral calcium.  The patient's hemoglobin is 9.0.  She has not actively bleeding.  Recommend continued monitoring of her CBC and transfuse packed red blood cells if her hemoglobin is less than 7.0 or active bleeding.  No transfusion is indicated today.  She has mild thrombocytopenia likely related to her bone involvement of her cancer.  Recommend continued monitoring.  No transfusion is indicated.  The patient is undecided about CODE STATUS.  She is currently a full code.  Discussed with the palliative care team who will see the patient to have a goals of care discussion with her.   LOS: 4 days   Mikey Bussing, DNP, AGPCNP-BC, AOCNP 11/18/18

## 2018-11-18 NOTE — Progress Notes (Signed)
PROGRESS NOTE    Suzanne [name]  RCV:[id number] DOB: 04/27/1961 DOA: 11/14/2018 PCP: Patient, No Pcp Per   Brief Narrative:  HPI per Dr. Harvie Bridge on 11/14/2018 HPI: Suzanne [name] is a 58 y.o. 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  Patient underwent blood transfusion yesterday and subsequently had SOB, DOE and orthopnea assoicated with cough.  She has a thoracentesis of right then left lungs on 6/2 and 6/3 and was scheduled for a repeat 3 days   Patient denies fevers/chills, weakness, dizziness, chest pain, N/V/C/D, abdominal pain, dysuria/frequency, changes in mental status.    EMS/ED Course:  Medical admission has been requested for further management of recurrent pleural effusion.  **Interim History  Patient underwent successful thoracentesis and removed 400 mL's of dark red fluid.  Repeat echocardiogram attempted however unable to be done due to patient's tachycardic heart rate.  Yesterday patient respiratory status worsened and she is started on diuresis however it continues to worsen so she was placed on BiPAP and transferred to the stepdown unit.  Patient was also started on antibiotics with cefepime given her fever this morning and worsening shortness of breath or for pneumonia.  Nutrition is consulted and recommending Ensure Enlive twice daily and continuing to encourage p.o. intakes.  Because of her tenuous respiratory status critical care was consulted for further evaluation recommendations and patient told the critical care doctor that she initially wanted to be DNR but now she is changed her mind she is full code.  Will need to continue diuresis and monitoring her respiratory status.  Patient is intermittently on BiPAP and now refusing it.  We will continue diuresis and BiPAP as needed and she may need a  Pleurx catheter if fluid accumulates.  Palliative care is consulted for goals of care discussion and patient's oncologist was consulted as well.  Patient is feeling a little better and wanted to go home but wants to discuss with palliative.  She is able to go on BiPAP last night and placed on 3 L of nasal cannula at morning.  Lower extremities rash is improving.  She has not started Ibrance but oncology recommending starting once her clinical picture improves.  We are continuing calcium supplementation.  Patient still wishes to be a full code.  Assessment & Plan:   Active Problems:   Shortness of breath   Pleural effusion, bilateral   Morbid obesity with BMI of 40.0-44.9, adult (HCC)   Carcinoma of breast, estrogen receptor positive, stage 4, right (HCC)   Hypoalbuminemia   Palliative care by specialist  Acute respiratory failure with hypoxia in the setting of recurrent left-sided pleural effusion and possibly suspected diastolic CHF exacerbation now requiring noninvasive positive pressure ventilation with BiPAP; now off of the BiPAP and wearing it intermittently -Continue supplemental oxygen via nasal cannula and wean O2 as tolerated but she acutely decompensated and required BiPAP and transferred to the stepdown unit -Continuous pulse oximetry and maintain O2 saturation over 90% -We will start diuresis with IV 40 twice daily and thoracentesis was done yesterday next-diuresis is been increased from 40 IV twice daily to 80 mg twice daily but will be cautious with diuresis now and dose has been cut down to 40 twice daily -Continue with DuoNeb 3 mL every 6 PRN for wheezing or shortness of breath -Started the patient on Xopenex/Atrovent scheduled and also  started budesonide and Brovana -Continue monitor respiratory status and repeat chest x-ray in a.m. -Repeat chest x-ray this morning showed stable low lung volumes and small bilateral pleural effusions along with a stable thick irregular reticular  opacity in the upper lobes and patchy left lung base consolidation likely representing a combination of lymphatic genic tumor and atelectasis. -Will need home ambulatory screen prior to discharge -May need Pleurx catheter but will need to discuss with medical oncology Dr. Jana Hakim -Continue with diuresis as above and remain Cautious  -We will need PT OT to evaluate and treat recommending home health -Continue to monitor respiratory status carefully and repeat chest x-ray in the a.m. -Pulmonary critical care consulted and they recommending BiPAP as needed as well as continue supplemental oxygen to maintain O2 saturation greater than 90% -CT of the chest ordered by pulmonary as and showed wide metastatic disease to the bones redemonstrated with multiple nondisplaced minimally displaced bilateral rib fractures as well as new lightly displaced fracture of the medial aspect of the right clavicle with probable adjacent intramuscular hemorrhage into the lower right sternocleidomastoid and right upper pectoralis pectoralis major muscle.  This was probably a tumoral infiltration throughout the right breast as well as bilateral axillary and subpectoral lymphadenopathy and unusual appearance of the lung similar to prior study there would suggest chronic systemic disease such as sarcoidosis or interstitial lung disease and the possibility of lymphangitic spread of the tumor in the lungs could be considered and there is a moderate predominantly subpulmonic pleural effusion and right pleural effusion -We will have pulmonary evaluate her CT scan -Patient's oncologist has waiting and feels that her case is complex and because she has neglected her cancer and her health in general for many years her breast cancer is not curable but is treatable and is currently being treated with antiestrogens and and also Mab with CK D4, 6 inhibitors to be added later.  -Currently patient is wanting to leave and be discharged home but  given her tenuous respiratory status she will bounce right back and will have palliative care weigh in for further goals of discussion care for patient.  If the patient decides on comfort care she may be a candidate for [facility] but will need further discussion. -Pulmonary recommending continue to titrate FiO2 to keep saturation greater than 90% and continue BiPAP increased work of breathing.  If she has any worsening shortness but they are recommending ABG.  Her risk of decompensation remains quite high and her malignant pleural effusion if it comes back will likely need a Pleurx catheter drainage for further management but does not appear to needed at this time -Pulmonary is stopping antibiotics after today's doses if she has no more fevers and remains clinically stable -After palliative discussion patient remains full code and wants full scope of interventions.  Patient is strongly considering continuation of treatment directed treating her cancer; medical oncology recommending skilled nursing facility but patient wants to go home  Recurrent L Pleural Effusion and is now coming back again -Admitted to Inpatient Telemetry -CXR on 11/14/2018 showed "Stable radiographic appearance of the chest since yesterday. A left pleural effusion and left basilar pulmonary opacity have increased from older prior studies. Evidence of extensive metastatic disease again noted." -IR consulted for Repeat Thoracentesis and removed 400 and mils of red fluid -Check INR daily -May need Pleurx catheter for further drainage and will need to discuss with oncology -I will have Dr. Jana Hakim decide about Pleurx catheter placement but do not feel  that she needs it currently but may need very shortly in the future  Volume overload in the setting of blood transfusion versus suspected Diastolic CHF -Continue Telemetry Monitoring  -Given blood transfusion -Given IV Lasix the first day she was here and started on 40 mg twice  daily but now increased to 80 twice daily and placed on BiPAP given worsening respiratory status; diuresis is been reduced to 40 mg twice daily by pulmonary -Strict I's/O's and Daily Weights -Last echocardiogram done on 10/27/2018 showed the left ventricle has normal systolic function with a EF of 60 to 35% however left ventricular diastolic Doppler parameters were indeterminate and limited due to acoustic windows not being able to be seen due to patient's body habitus -We will recount a repeat limited echo to assess for Diastolic Dysfunction -Patient is -171.6 mL since admission -Continue to monitor for signs and symptoms of volume overload  Hypokalemia -Potassium is now 3.4 in the setting of diuresis -Replete with potassium chloride 30 mEq twice daily x2 doses along with p.o. K-Phos Neutral 500 mg twice daily x2 doses -Continue to Monitor and Replete as Necessary -Repeat CMP in AM   Hypophosphatemia -Patient's phosphorus levels morning was 1.8 -Replete with p.o. 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 Lurline Del -Patient was also started Ibrance but will hold currently and allow oncology to weigh in; patient: Oncology records was to start Palbociclib 1.5 mg p.o. daily when she received it -Also was started on Xgeva yesterday due to bone metastasis -Continue anastrozole 1 mg p.o. daily -Pain control with oxycodone IR 2.5 mg every 4 PRN for moderate and severe pain and then IV morphine 1 mg every 4 as needed for severe pain -Continue with antiemetics -Bowel regimen with bisacodyl 5 mg p.o. daily as needed as well as MiraLAX 17 g p.o. daily PRN for  moderate constipation along with senna docusate 1 tab p.o. nightly PRN for mild constipation -CT scan of the chest showed worsening metastatic disease and which was widespread.  Metastatic disease showed redemonstration to the bones along with multiple nondisplaced minimally displaced bilateral rib fractures along with a new mildly displaced fracture of the medial aspect of the right clavicle with probable additional intramuscular hemorrhage and right lower sternocleidomastoid muscle and the upper right pectoralis major muscle.  This was felt this was probable tumor oral infiltration throughout the right breast -Nephrology following  Hypocalcemia -Patient's Ca2+ this AM was 5.4 -Check Ionized Ca2+ In AM  -Give 1 gram of IV Calcium Gluconate again today and may need a calcium gluconate drip if calcium still remains low in the a.m. -Continue to Monitor and Replete as Necessary -Repeat CMP in AM   Obesity -Estimated body mass index is 39.31 kg/m as calculated from the following:   Height as of this encounter: 5\' 2"  (1.575 m).   Weight as of this encounter: 97.5 kg.  -Weight loss and dietary counseling given  Bilateral lower extremity rash in the feet and the legs  -unclear etiology but appears to be some mild petechiae -We will continue monitor and follow and may need some corticosteroids  DVT prophylaxis: Heparin 5000 units subcu q8h Code Status: FULL CODE  Family Communication: No family present at bedside discussed the case with the daughter yesterday over the phone Disposition Plan: Remain Inpatient for continued work up and treatment; patient wants to leave to go home but I do not feel it is safe for her until goals of care discussion is had  Consultants:   Interventional Radiology   Medical oncology  Pulmonary  Palliative care   Procedures:  Consulted IR for Thoracentesis   Antimicrobials: Anti-infectives (From admission, onward)   Start     Dose/Rate Route Frequency  Ordered Stop   11/17/18 0600  piperacillin-tazobactam (ZOSYN) IVPB 3.375 g     3.375 g 12.5 mL/hr over 240 Minutes Intravenous Every 8 hours 11/16/18 2235     11/17/18 0000  piperacillin-tazobactam (ZOSYN) IVPB 3.375 g  Status:  Discontinued     3.375 g 100 mL/hr over 30 Minutes Intravenous Every 6 hours 11/16/18 2231 11/16/18 2235   11/16/18 0900  ceFEPIme (MAXIPIME) 1 g in sodium chloride 0.9 % 100 mL IVPB  Status:  Discontinued     1 g 200 mL/hr over 30 Minutes Intravenous Every 8 hours 11/16/18 0815 11/16/18 2231     Subjective: Seen and examined at bedside and states that she is breathing a little bit better.  No nausea or vomiting.  Thinks the rash in her legs is improving.  No other concerns or complaints at this time.  Objective: Vitals:   11/18/18 0800 11/18/18 0900 11/18/18 1205 11/18/18 1600  BP:  138/64    Pulse:  100    Resp:  (!) 30    Temp: 99 F (37.2 C)  98.3 F (36.8 C) 99.8 F (37.7 C)  TempSrc: Oral  Oral Oral  SpO2:  99%    Weight:      Height:        Intake/Output Summary (Last 24 hours) at 11/18/2018 1936 Last data filed at 11/18/2018 0600 Gross per 24 hour  Intake 120 ml  Output 950 ml  Net -830 ml   Filed Weights   11/16/18 0627 11/16/18 1908 11/17/18 0421  Weight: 100.6 kg 98.3 kg 97.5 kg   Examination: Physical Exam:  Constitutional: Well-nourished, well-developed morbidly obese Caucasian female whose anxious mood for wanting to go home Eyes: Lids and conjunctive are normal.  Sclera anicteric ENMT: External ears and nose appear normal.  Grossly normal hearing Neck: Appears supple no JVD Respiratory: Diminished to auscultation bilaterally with coarse breath sounds and some rhonchi and was some crackle.  Breathing is not labored and she is wearing supplemental oxygen via nasal cannula but is slightly tachypneic Cardiovascular: Tachycardic rate but regular rhythm.  Has a 2 out of 6 systolic murmur.  Has 1+ lower extremity edema now Abdomen:  Soft, nontender, distended secondary body habitus GU: Deferred but has a Foley catheter in place Musculoskeletal: No contractures or cyanosis.  No joint deformities in the upper and lower extremity Skin: Rash in her extremities is improving.  Leg swelling is not as much. Neurologic: Cranial nerves II through XII grossly intact no appreciable focal deficits Psychiatric: Anxious but she is awake, alert, oriented  Data Reviewed: I have personally reviewed following labs and imaging studies  CBC: Recent Labs  Lab 11/13/18 0941 11/14/18 1620 11/15/18 0543 11/16/18 0502 11/17/18 0215 11/18/18 0955  WBC 9.3 7.7 8.3 9.3 7.7 7.8  NEUTROABS 6.0 4.9  --  6.6 5.4 5.2  HGB 6.6* 9.5* 9.9* 9.0* 8.5* 9.0*  HCT 20.8* 30.7* 31.7* 28.9* 27.3* 27.7*  MCV 99.0 100.0 101.0*  100.0 102.6* 99.6  PLT 145* 131* 135* PLATELET CLUMPS NOTED ON SMEAR, UNABLE TO ESTIMATE 101* 99*   Basic Metabolic Panel: Recent Labs  Lab 11/14/18 1620 11/14/18 1621 11/15/18 0543 11/16/18 0502 11/17/18 0215 11/18/18 0955  NA 136  --  135 135 137 140  K 3.2*  --  3.5 3.7 2.8* 3.4*  CL 95*  --  96* 94* 93* 96*  CO2 31  --  30 29 30 27   GLUCOSE 119*  --  155* 119* 117* 115*  BUN 9  --  8 10 15 14   CREATININE 0.73  --  0.65 0.66 0.66 0.68  CALCIUM 6.8*  --  6.4* 5.8* 5.2* 5.4*  MG  --  2.4  --  1.9 2.2 2.0  PHOS  --  2.5  --  2.4* 2.3* 1.8*   GFR: Estimated Creatinine Clearance: 82.6 mL/min (by C-G formula based on SCr of 0.68 mg/dL). Liver Function Tests: Recent Labs  Lab 11/14/18 1620 11/15/18 0543 11/16/18 0502 11/17/18 0215 11/18/18 0955  AST 26 36 31 22 22   ALT 17 22 18 16 15   ALKPHOS 292* 295* 268* 208* 195*  BILITOT 0.7 0.5 0.8 0.9 0.8  PROT 6.3* 6.3* 5.9* 6.2* 6.3*  ALBUMIN 2.2* 2.1* 1.9* 2.3* 2.6*   No results for input(s): LIPASE, AMYLASE in the last 168 hours. No results for input(s): AMMONIA in the last 168 hours. Coagulation Profile: Recent Labs  Lab 11/15/18 0543  INR 1.2   Cardiac  Enzymes: No results for input(s): CKTOTAL, CKMB, CKMBINDEX, TROPONINI in the last 168 hours. BNP (last 3 results) No results for input(s): PROBNP in the last 8760 hours. HbA1C: No results for input(s): HGBA1C in the last 72 hours. CBG: No results for input(s): GLUCAP in the last 168 hours. Lipid Profile: No results for input(s): CHOL, HDL, LDLCALC, TRIG, CHOLHDL, LDLDIRECT in the last 72 hours. Thyroid Function Tests: No results for input(s): TSH, T4TOTAL, FREET4, T3FREE, THYROIDAB in the last 72 hours. Anemia Panel: No results for input(s): VITAMINB12, FOLATE, FERRITIN, TIBC, IRON, RETICCTPCT in the last 72 hours. Sepsis Labs: Recent Labs  Lab 11/16/18 0910 11/16/18 1158 11/17/18 0215 11/18/18 0212  PROCALCITON 1.02  --  2.30 1.69  LATICACIDVEN 1.5 1.3  --   --     Recent Results (from the past 240 hour(s))  SARS Coronavirus 2 (CEPHEID - Performed in [facility] hospital lab), Hosp Order     Status: None   Collection Time: 11/14/18  4:34 PM   Specimen: Nasopharyngeal Swab  Result Value Ref Range Status   SARS Coronavirus 2 NEGATIVE NEGATIVE Final    Comment: (NOTE) If result is NEGATIVE SARS-CoV-2 target nucleic acids are NOT DETECTED. The SARS-CoV-2 RNA is generally detectable in upper and lower  respiratory specimens during the acute phase of infection. The lowest  concentration of SARS-CoV-2 viral copies this assay can detect is 250  copies / mL. A negative result does not preclude SARS-CoV-2 infection  and should not be used as the sole basis for treatment or other  patient management decisions.  A negative result may occur with  improper specimen collection / handling, submission of specimen other  than nasopharyngeal swab, presence of viral mutation(s) within the  areas targeted by this assay, and inadequate number of viral copies  (<250 copies / mL). A negative result must be combined with clinical  observations, patient history, and epidemiological  information. If result is POSITIVE SARS-CoV-2 target nucleic acids are DETECTED. The SARS-CoV-2 RNA is  generally detectable in upper and lower  respiratory specimens dur ing the acute phase of infection.  Positive  results are indicative of active infection with SARS-CoV-2.  Clinical  correlation with patient history and other diagnostic information is  necessary to determine patient infection status.  Positive results do  not rule out bacterial infection or co-infection with other viruses. If result is PRESUMPTIVE POSTIVE SARS-CoV-2 nucleic acids MAY BE PRESENT.   A presumptive positive result was obtained on the submitted specimen  and confirmed on repeat testing.  While 2019 novel coronavirus  (SARS-CoV-2) nucleic acids may be present in the submitted sample  additional confirmatory testing may be necessary for epidemiological  and / or clinical management purposes  to differentiate between  SARS-CoV-2 and other Sarbecovirus currently known to infect humans.  If clinically indicated additional testing with an alternate test  methodology [phone]) is advised. The SARS-CoV-2 RNA is generally  detectable in upper and lower respiratory sp ecimens during the acute  phase of infection. The expected result is Negative. Fact Sheet for Patients:  StrictlyIdeas.no Fact Sheet for Healthcare Providers: BankingDealers.co.za This test is not yet approved or cleared by the [place] FDA and has been authorized for detection and/or diagnosis of SARS-CoV-2 by FDA under an Emergency Use Authorization (EUA).  This EUA will remain in effect (meaning this test can be used) for the duration of the COVID-19 declaration under Section 564(b)(1) of the Act, 21 U.S.C. section 360bbb-3(b)(1), unless the authorization is terminated or revoked sooner. Performed at [facility], [place] [address], [place], [place] [address]   MRSA PCR  Screening     Status: None   Collection Time: 11/16/18  8:02 AM   Specimen: Nasal Mucosa; Nasopharyngeal  Result Value Ref Range Status   MRSA by PCR NEGATIVE NEGATIVE Final    Comment:        The GeneXpert MRSA Assay (FDA approved for NASAL specimens only), is one component of a comprehensive MRSA colonization surveillance program. It is not intended to diagnose MRSA infection nor to guide or monitor treatment for MRSA infections. Performed at [facility], [place] [address] [place], [place] [address]     Radiology Studies: Ct Chest Wo Contrast  Result Date: 11/17/2018 CLINICAL DATA:  58 year old female Caucasian female nonsmoker with history of bilateral lower extremity edema. Possible stage IV breast cancer. EXAM: CT CHEST WITHOUT CONTRAST TECHNIQUE: Multidetector CT imaging of the chest was performed following the standard protocol without IV contrast. COMPARISON:  Chest CT 10/26/2018. FINDINGS: Cardiovascular: Heart size is normal. There is no significant pericardial fluid, thickening or pericardial calcification. There is aortic atherosclerosis, as well as atherosclerosis of the great vessels of the mediastinum and the coronary arteries, including calcified atherosclerotic plaque in the left anterior descending and right coronary arteries. Mediastinum/Nodes: No pathologically enlarged mediastinal or hilar lymph nodes. Please note that accurate exclusion of hilar adenopathy is limited on noncontrast CT scans. Esophagus is unremarkable in appearance. Multiple borderline enlarged and mildly enlarged bilateral axillary and subpectoral lymph nodes (right greater than left) measuring up to 15 mm in short axis on the right (axial image 49 of series 2). Lungs/Pleura: Extensive septal thickening with band like opacities throughout both lungs, similar to the prior study. No discrete pulmonary nodules or masses are confidently identified. Moderate left and small right pleural  effusions. The left pleural effusion is predominantly sub pulmonic in position. Upper Abdomen: Several peripherally calcified gallstones lying dependently in the gallbladder measuring up to 1.9 cm  in diameter. Aortic atherosclerosis. Musculoskeletal: Right breast is incompletely imaged, but there appears to be irregular soft tissue infiltration throughout much of the right breast tissue, which could reflect tumoral in for tray shin and/or edema. Diffuse lytic and sclerotic lesions throughout the visualized axial and appendicular skeleton, indicative of widespread metastatic disease to the bones. With appears very similar to the prior examination. There continue to be multiple nondisplaced to minimally displaced pathologic fractures throughout the ribs bilaterally. In addition, there is a new widely displaced fracture of the medial aspect of the right clavicle with approximately 1.4 cm of lateral and anterior displacement, as well as high attenuation thickening of the adjacent musculature in the upper right pectoralis major and lower right sternocleidomastoid muscles, most compatible with intramuscular hematoma and/or tumor. IMPRESSION: 1. Widespread metastatic disease to the bones redemonstrated, with multiple nondisplaced minimally displaced bilateral rib fractures, as well as a new widely displaced fracture of the medial aspect of the right clavicle with probable adjacent intramuscular hemorrhage in the lower right sternocleidomastoid muscle and upper right pectoralis major muscle. Probable tumoral infiltration throughout the right breast, as well as bilateral axillary and subpectoral lymphadenopathy (right greater than left). 2. Unusual appearance of the lungs, similar to the prior study, which could suggest chronic systemic disease such as sarcoidosis or interstitial lung disease. The possibility of lymphangitic spread of tumor in the lungs could be considered, but is not strongly favored at this time. 3.  Moderate left (predominantly subpulmonic) pleural effusion and small right pleural effusion. 4. Cholelithiasis. 5. Aortic atherosclerosis, in addition to 2 vessel coronary artery disease. Aortic Atherosclerosis (ICD10-I70.0). Electronically Signed   By: Vinnie Langton M.D.   On: 11/17/2018 14:55   Dg Chest Port 1 View  Result Date: 11/18/2018 CLINICAL DATA:  Shortness of breath and increased weakness this morning; history metastatic breast cancer EXAM: PORTABLE CHEST 1 VIEW COMPARISON:  Portable exam 0823 hours compared to 11/17/2018 FINDINGS: Very low lung volumes. Normal heart size mediastinal contours. Patchy opacities in the lungs bilaterally favoring pneumonia over atelectasis. No pleural effusion or pneumothorax. Patchy osteosclerosis consistent with osseous metastatic disease. IMPRESSION: BILATERAL pulmonary opacities likely representing multifocal pneumonia. Electronically Signed   By: Lavonia Dana M.D.   On: 11/18/2018 09:30   Dg Chest Port 1 View  Result Date: 11/17/2018 CLINICAL DATA:  Shortness of breath.  Metastatic breast cancer. EXAM: PORTABLE CHEST 1 VIEW COMPARISON:  11/16/2018.  CT 10/26/2018. FINDINGS: Mediastinum stable. Heart size normal. Persistent left base atelectasis infiltrate. Right base atelectasis/infiltrate also noted on today's exam. Diffuse bilateral interstitial prominence again noted. Tiny left pleural effusion again noted. No pneumothorax. Multiple lytic lesions noted throughout the visualized bony structures. IMPRESSION: 1. Persistent left base atelectasis/infiltrate. Right base atelectasis/infiltrate also noted on today's exam. Small left pleural effusion again noted. 2. Diffuse bilateral interstitial prominence again noted consistent with interstitial tumor spread this patient with known metastatic disease. 3. Multiple lytic lesions are again noted throughout the visualized bony structures consistent with metastatic disease. Electronically Signed   By: Marcello Moores  Register    On: 11/17/2018 06:24   Vas [place] Lower Extremity Venous (dvt)  Result Date: 11/18/2018  Lower Venous Study Indications: Swelling.  Risk Factors: Cancer. Limitations: Body habitus and poor ultrasound/tissue interface. Comparison Study: No prior studies. Performing Technologist: Oliver Hum RVT  Examination Guidelines: A complete evaluation includes B-mode imaging, spectral Doppler, color Doppler, and power Doppler as needed of all accessible portions of each vessel. Bilateral testing is considered an integral part of a complete  examination. Limited examinations for reoccurring indications may be performed as noted.  +---------+---------------+---------+-----------+----------+-------+  RIGHT     Compressibility Phasicity Spontaneity Properties Summary  +---------+---------------+---------+-----------+----------+-------+  CFV       Full            Yes       Yes                             +---------+---------------+---------+-----------+----------+-------+  SFJ       Full                                                      +---------+---------------+---------+-----------+----------+-------+  FV Prox   Full                                                      +---------+---------------+---------+-----------+----------+-------+  FV Mid    Full                                                      +---------+---------------+---------+-----------+----------+-------+  FV Distal Full                                                      +---------+---------------+---------+-----------+----------+-------+  PFV       Full                                                      +---------+---------------+---------+-----------+----------+-------+  POP       Full            Yes       Yes                             +---------+---------------+---------+-----------+----------+-------+  PTV       Full                                                      +---------+---------------+---------+-----------+----------+-------+  PERO       Full                                                      +---------+---------------+---------+-----------+----------+-------+   +---------+---------------+---------+-----------+----------+-------+  LEFT      Compressibility Phasicity Spontaneity Properties Summary  +---------+---------------+---------+-----------+----------+-------+  CFV       Full  Yes       Yes                             +---------+---------------+---------+-----------+----------+-------+  SFJ       Full                                                      +---------+---------------+---------+-----------+----------+-------+  FV Prox   Full                                                      +---------+---------------+---------+-----------+----------+-------+  FV Mid    Full                                                      +---------+---------------+---------+-----------+----------+-------+  FV Distal Full                                                      +---------+---------------+---------+-----------+----------+-------+  PFV       Full                                                      +---------+---------------+---------+-----------+----------+-------+  POP       Full            Yes       Yes                             +---------+---------------+---------+-----------+----------+-------+  PTV       Full                                                      +---------+---------------+---------+-----------+----------+-------+  PERO      Full                                                      +---------+---------------+---------+-----------+----------+-------+     Summary: Right: There is no evidence of deep vein thrombosis in the lower extremity. No cystic structure found in the popliteal fossa. Left: There is no evidence of deep vein thrombosis in the lower extremity. No cystic structure found in the popliteal fossa.  *See table(s) above for measurements and observations. Electronically signed by Deitra Mayo MD on  11/18/2018 at 11:42:50 AM.    Final    Scheduled Meds:  anastrozole  1 mg Oral Daily   calcium-vitamin D  1 tablet Oral BID   Chlorhexidine Gluconate Cloth  6 each Topical Daily   feeding supplement (ENSURE ENLIVE)  237 mL Oral BID BM   furosemide  40 mg Intravenous BID   heparin  5,000 Units Subcutaneous Q8H   mouth rinse  15 mL Mouth Rinse q12n4p    morphine injection  1 mg Intravenous Q2H   Continuous Infusions:  sodium chloride Stopped (11/16/18 1407)   piperacillin-tazobactam (ZOSYN)  IV 3.375 g (11/18/18 1536)    LOS: 4 days   Kerney Elbe, DO [facility] PAGER is on AMION  If 7PM-7AM, please contact night-coverage [email or web address] Password [email or web address] 11/18/2018, 7:36 PM

## 2018-11-18 NOTE — Consult Note (Signed)
Consultation Note Date: 11/18/2018   Patient Name: Suzanne [name]  DOB: 08/03/1960  MRN: [id number]  Age / Sex: 58 y.o., female  PCP: Patient, No Pcp Per Referring Physician: Kerney Elbe, DO  Reason for Consultation: Establishing goals of care  HPI/Patient Profile: 58 y.o. female admitted on 11/14/2018     58 y/o F admitted 6/19 with SOB / DOE.  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 Received PR[facility] on 6/18 and developed SOB / DOE after transfusion. Patient has undergone thoracentesis on the left side with 400 mL of dark red pleural fluid removed on 6-20.  He is being seen by pulmonary specialist.  She remains admitted to hospital medicine service.  Medical oncology is also following.  A palliative medicine consultation has been requested for goals of care discussions.  Clinical Assessment and Goals of Care: Patient is sitting up in chair.  She is watching television.  She appears anxious.  I introduced myself and palliative care as follows: Palliative medicine is specialized medical care for people living with serious illness. It focuses on providing relief from the symptoms and stress of a serious illness. The goal is to improve quality of life for both the patient and the family.  Goals of care: Broad aims of medical therapy in relation to the patient's values and preferences. Our aim is to provide medical care aimed at enabling patients to achieve the goals that matter most to them, given the circumstances of their particular medical situation and their constraints.   Attempted to elicit patient's goals wishes and values.  Discussed about patient's understanding of and insight into her current serious illness.  Patient appears anxious, gets panicked quickly.  She recalls meeting with Dr. Jana Hakim the other day and states that she would like to continue follow-up  with him, and that she remains optimistic about continuing certain treatments for her cancer so that she could have longer time to live.  She states she does not feel strong but that she feels stronger than yesterday.  She states that she is trying to gradually increase her oral intake.  She states that she becomes very anxious whenever she has to use the BiPAP.  She is currently on oxygen via nasal cannula.  Discussed in detail with patient.  Provided active listening and supportive care.  NEXT OF KIN  lives at home with daughter.   SUMMARY OF RECOMMENDATIONS   Full code full scope for now.  Medical oncology follow-up as outpatient.  Patient strongly considering continuation of treatments directed at treating her cancer and giving her with some degree of both quality and quantity of life.  Continue current scope of care.  Consider skilled nursing facility for rehab with palliative care following once the patient is ready for discharge.  Code Status/Advance Care Planning:  Full code    Symptom Management:   As above  Palliative Prophylaxis:   Delirium Protocol  Additional Recommendations (Limitations, Scope, Preferences):  Full Scope Treatment  Psycho-social/Spiritual:   Desire for  further Chaplaincy support:yes  Additional Recommendations: Caregiving  Support/Resources  Prognosis:   Unable to determine  Discharge Planning: To Be Determined      Primary Diagnoses: Present on Admission: . Shortness of breath . Pleural effusion, bilateral . Hypoalbuminemia   I have reviewed the medical record, interviewed the patient and family, and examined the patient. The following aspects are pertinent.  Past Medical History:  Diagnosis Date  . Cancer ([place])   . Gallstones   . Mastitis    reports history of recurrent mastitis  . Metastatic breast cancer ([place])   . Obesity    Social History   Socioeconomic History  . Marital status: Widowed    Spouse name: Not on  file  . Number of children: 1  . Years of education: Not on file  . Highest education level: Not on file  Occupational History  . Occupation: Doctor, general practice    Comment: Self-employed  Social Needs  . Financial resource strain: Not on file  . Food insecurity    Worry: Not on file    Inability: Not on file  . Transportation needs    Medical: Not on file    Non-medical: Not on file  Tobacco Use  . Smoking status: Never Smoker  . Smokeless tobacco: Never Used  Substance and Sexual Activity  . Alcohol use: No  . Drug use: No  . Sexual activity: Not Currently  Lifestyle  . Physical activity    Days per week: Not on file    Minutes per session: Not on file  . Stress: Not on file  Relationships  . Social Herbalist on phone: Not on file    Gets together: Not on file    Attends religious service: Not on file    Active member of club or organization: Not on file    Attends meetings of clubs or organizations: Not on file    Relationship status: Not on file  Other Topics Concern  . Not on file  Social History Narrative   Has a daughter named [name]. Daughter has CP.   Family History  Problem Relation Age of Onset  . Breast cancer Sister        in her 67's-70s  . Heart disease Brother        CABG  . Heart disease Brother        CABG  . Skin cancer Brother        melanoma  . Colon cancer Cousin   . Heart attack Mother   . Heart attack Father   . Prostate cancer Brother    Scheduled Meds: . anastrozole  1 mg Oral Daily  . calcium-vitamin D  1 tablet Oral BID  . Chlorhexidine Gluconate Cloth  6 each Topical Daily  . feeding supplement (ENSURE ENLIVE)  237 mL Oral BID BM  . furosemide  40 mg Intravenous BID  . heparin  5,000 Units Subcutaneous Q8H  . mouth rinse  15 mL Mouth Rinse q12n4p  .  morphine injection  1 mg Intravenous Q2H   Continuous Infusions: . sodium chloride Stopped (11/16/18 1407)  . calcium gluconate 1,000 mg (11/18/18 1209)   . piperacillin-tazobactam (ZOSYN)  IV Stopped (11/18/18 0931)   PRN Meds:.sodium chloride, acetaminophen **OR** acetaminophen, bisacodyl, clotrimazole, levalbuterol, LORazepam, morphine injection, ondansetron **OR** ondansetron (ZOFRAN) IV, oxyCODONE, polyethylene glycol, senna-docusate Medications Prior to Admission:  Prior to Admission medications   Medication Sig Start Date End Date Taking? Authorizing Provider  anastrozole (ARIMIDEX)  1 MG tablet Take 1 tablet (1 mg total) by mouth daily. 10/31/18  Yes Meccariello, Bernita Raisin, DO  clotrimazole (LOTRIMIN) 1 % cream Apply topically 2 (two) times daily. For 2-4 weeks. 10/30/18  Yes Meccariello, Bernita Raisin, DO  furosemide (LASIX) 20 MG tablet Take 1 tablet (20 mg total) by mouth daily. 11/10/18  Yes [name], Virgie Dad, MD  oxyCODONE (OXY IR/ROXICODONE) 5 MG immediate release tablet Take 0.5 tablets (2.5 mg total) by mouth every 4 (four) hours as needed for moderate pain or severe pain. 11/06/18  Yes [name], Virgie Dad, MD  polyethylene glycol (MIRALAX / GLYCOLAX) 17 g packet Take 17 g by mouth as needed for constipation.   Yes [provider]  palbociclib (IBRANCE) 125 MG capsule Take 1 capsule (125 mg total) by mouth daily with breakfast. Take whole with food. Take for 21 days on, 7 days off, repeat every 28 days. 11/06/18   [name], Virgie Dad, MD   No Known Allergies Review of Systems Positive for anxiety Physical Exam Awake alert sitting in chair Has anxiety Has lower extremity edema S1-S2 Diminished breath sounds bilaterally It does not take much for the patient to become tachypneic and anxious, a little bit hard of hearing. Otherwise nonfocal  Vital Signs: BP 138/64   Pulse 100   Temp 99 F (37.2 C) (Oral)   Resp (!) 30   Ht 5\' 2"  (1.575 m)   Wt 97.5 kg   LMP 05/28/2013 (Within Months)   SpO2 99%   BMI 39.31 kg/m  Pain Scale: 0-10 POSS *See Group Information*: 1-Acceptable,Awake and alert Pain Score: 4    SpO2: SpO2: 99 %  O2 Device:SpO2: 99 % O2 Flow Rate: .O2 Flow Rate (L/min): 3 L/min  IO: Intake/output summary:   Intake/Output Summary (Last 24 hours) at 11/18/2018 1238 Last data filed at 11/18/2018 0600 Gross per 24 hour  Intake 333.45 ml  Output 1550 ml  Net -1216.55 ml    LBM: Last BM Date: 11/17/18 Baseline Weight: Weight: 106 kg Most recent weight: Weight: 97.5 kg     Palliative Assessment/Data:   Flowsheet Rows     Most Recent Value  Intake Tab  Referral Department  Hospitalist  Unit at Time of Referral  Intermediate Care Unit  Palliative Care Primary Diagnosis  Cancer  Palliative Care Type  New Palliative care  Date first seen by Palliative Care  11/18/18  Clinical Assessment  Palliative Performance Scale Score  40%  Pain Max last 24 hours  4  Pain Min Last 24 hours  3  Dyspnea Max Last 24 Hours  4  Dyspnea Min Last 24 hours  3  Nausea Max Last 24 Hours  3  Nausea Min Last 24 Hours  2  Anxiety Max Last 24 Hours  5  Anxiety Min Last 24 Hours  4  Psychosocial & Spiritual Assessment  Palliative Care Outcomes      Time In:  11 Time Out:  12 Time Total:  60 min  Greater than 50%  of this time was spent counseling and coordinating care related to the above assessment and plan.  Signed by: Loistine Chance, MD [phone]   Please contact Palliative Medicine Team phone at [phone] for questions and concerns.  For individual provider: See Shea Evans

## 2018-11-18 NOTE — Progress Notes (Signed)
CRITICAL VALUE ALERT  Critical Value:  Calcium 5.4  Date & Time Notied:  1003  Provider Notified: Sheikh  Orders Received/Actions taken: Awaiting orders will continue to monitor

## 2018-11-18 NOTE — Progress Notes (Signed)
Pt requesting removal of BIPAP.  Pt placed on 3 LPM [place], Pt tolerating well at this time.  RT to monitor and assess as needed.

## 2018-11-19 ENCOUNTER — Inpatient Hospital Stay (HOSPITAL_COMMUNITY): Payer: Medicaid Other

## 2018-11-19 ENCOUNTER — Inpatient Hospital Stay: Payer: Self-pay | Admitting: Critical Care Medicine

## 2018-11-19 DIAGNOSIS — D649 Anemia, unspecified: Secondary | ICD-10-CM

## 2018-11-19 DIAGNOSIS — J9611 Chronic respiratory failure with hypoxia: Secondary | ICD-10-CM | POA: Diagnosis present

## 2018-11-19 DIAGNOSIS — J9612 Chronic respiratory failure with hypercapnia: Secondary | ICD-10-CM | POA: Diagnosis present

## 2018-11-19 DIAGNOSIS — J690 Pneumonitis due to inhalation of food and vomit: Secondary | ICD-10-CM

## 2018-11-19 DIAGNOSIS — Z515 Encounter for palliative care: Secondary | ICD-10-CM

## 2018-11-19 DIAGNOSIS — R0602 Shortness of breath: Secondary | ICD-10-CM

## 2018-11-19 DIAGNOSIS — J910 Malignant pleural effusion: Secondary | ICD-10-CM | POA: Diagnosis present

## 2018-11-19 LAB — CBC WITH DIFFERENTIAL/PLATELET
Abs Immature Granulocytes: 0.26 10*3/uL — ABNORMAL HIGH (ref 0.00–0.07)
Basophils Absolute: 0 10*3/uL (ref 0.0–0.1)
Basophils Relative: 0 %
Eosinophils Absolute: 0.1 10*3/uL (ref 0.0–0.5)
Eosinophils Relative: 2 %
HCT: 26.5 % — ABNORMAL LOW (ref 36.0–46.0)
Hemoglobin: 8.1 g/dL — ABNORMAL LOW (ref 12.0–15.0)
Immature Granulocytes: 3 %
Lymphocytes Relative: 27 %
Lymphs Abs: 2.2 10*3/uL (ref 0.7–4.0)
MCH: 31.8 pg (ref 26.0–34.0)
MCHC: 30.6 g/dL (ref 30.0–36.0)
MCV: 103.9 fL — ABNORMAL HIGH (ref 80.0–100.0)
Monocytes Absolute: 0.3 10*3/uL (ref 0.1–1.0)
Monocytes Relative: 4 %
Neutro Abs: 5.1 10*3/uL (ref 1.7–7.7)
Neutrophils Relative %: 64 %
Platelets: 96 10*3/uL — ABNORMAL LOW (ref 150–400)
RBC: 2.55 MIL/uL — ABNORMAL LOW (ref 3.87–5.11)
RDW: 18.2 % — ABNORMAL HIGH (ref 11.5–15.5)
WBC: 8 10*3/uL (ref 4.0–10.5)
nRBC: 1 % — ABNORMAL HIGH (ref 0.0–0.2)

## 2018-11-19 LAB — BASIC METABOLIC PANEL
Anion gap: 12 (ref 5–15)
BUN: 12 mg/dL (ref 6–20)
CO2: 36 mmol/L — ABNORMAL HIGH (ref 22–32)
Calcium: 5.2 mg/dL — CL (ref 8.9–10.3)
Chloride: 92 mmol/L — ABNORMAL LOW (ref 98–111)
Creatinine, Ser: 0.65 mg/dL (ref 0.44–1.00)
GFR calc Af Amer: >60 mL/min (ref >60)
GFR calc non Af Amer: >60 mL/min (ref >60)
Glucose, Bld: 208 mg/dL — ABNORMAL HIGH (ref 70–99)
Potassium: 3 mmol/L — ABNORMAL LOW (ref 3.5–5.1)
Sodium: 140 mmol/L (ref 135–145)

## 2018-11-19 LAB — COMPREHENSIVE METABOLIC PANEL
ALT: 14 U/L (ref 0–44)
AST: 22 U/L (ref 15–41)
Albumin: 2.3 g/dL — ABNORMAL LOW (ref 3.5–5.0)
Alkaline Phosphatase: 194 U/L — ABNORMAL HIGH (ref 38–126)
Anion gap: 14 (ref 5–15)
BUN: 13 mg/dL (ref 6–20)
CO2: 31 mmol/L (ref 22–32)
Calcium: 5.1 mg/dL — CL (ref 8.9–10.3)
Chloride: 92 mmol/L — ABNORMAL LOW (ref 98–111)
Creatinine, Ser: 0.71 mg/dL (ref 0.44–1.00)
GFR calc Af Amer: >60 mL/min (ref >60)
GFR calc non Af Amer: >60 mL/min (ref >60)
Glucose, Bld: 114 mg/dL — ABNORMAL HIGH (ref 70–99)
Potassium: 2.6 mmol/L — CL (ref 3.5–5.1)
Sodium: 137 mmol/L (ref 135–145)
Total Bilirubin: 0.8 mg/dL (ref 0.3–1.2)
Total Protein: 5.7 g/dL — ABNORMAL LOW (ref 6.5–8.1)

## 2018-11-19 LAB — PHOSPHORUS: Phosphorus: 1.8 mg/dL — ABNORMAL LOW (ref 2.5–4.6)

## 2018-11-19 LAB — MAGNESIUM: Magnesium: 1.9 mg/dL (ref 1.7–2.4)

## 2018-11-19 MED ORDER — CALCIUM GLUCONATE-NACL 1-0.675 GM/50ML-% IV SOLN
1.0000 g | INTRAVENOUS | Status: AC
  Administered 2018-11-19 (×2): 1000 mg via INTRAVENOUS
  Filled 2018-11-19 (×2): qty 50

## 2018-11-19 MED ORDER — LEVALBUTEROL HCL 0.63 MG/3ML IN NEBU
0.6300 mg | INHALATION_SOLUTION | Freq: Three times a day (TID) | RESPIRATORY_TRACT | Status: DC
  Administered 2018-11-19 – 2018-11-23 (×12): 0.63 mg via RESPIRATORY_TRACT
  Filled 2018-11-19 (×12): qty 3

## 2018-11-19 MED ORDER — MAGNESIUM SULFATE 2 GM/50ML IV SOLN
2.0000 g | Freq: Once | INTRAVENOUS | Status: AC
  Administered 2018-11-19: 2 g via INTRAVENOUS
  Filled 2018-11-19: qty 50

## 2018-11-19 MED ORDER — POTASSIUM CHLORIDE CRYS ER 20 MEQ PO TBCR
40.0000 meq | EXTENDED_RELEASE_TABLET | Freq: Once | ORAL | Status: AC
  Administered 2018-11-19: 40 meq via ORAL
  Filled 2018-11-19: qty 2

## 2018-11-19 MED ORDER — POTASSIUM CHLORIDE 10 MEQ/100ML IV SOLN
10.0000 meq | INTRAVENOUS | Status: AC
  Administered 2018-11-19 (×3): 10 meq via INTRAVENOUS
  Filled 2018-11-19 (×3): qty 100

## 2018-11-19 MED ORDER — IPRATROPIUM BROMIDE 0.02 % IN SOLN
0.5000 mg | Freq: Three times a day (TID) | RESPIRATORY_TRACT | Status: DC
  Administered 2018-11-19 – 2018-11-23 (×12): 0.5 mg via RESPIRATORY_TRACT
  Filled 2018-11-19 (×12): qty 2.5

## 2018-11-19 MED ORDER — CALCIUM CARBONATE-VITAMIN D 500-200 MG-UNIT PO TABS
1.0000 | ORAL_TABLET | Freq: Three times a day (TID) | ORAL | Status: DC
  Administered 2018-11-19 – 2018-11-26 (×23): 1 via ORAL
  Filled 2018-11-19 (×23): qty 1

## 2018-11-19 MED ORDER — K PHOS MONO-SOD PHOS DI & MONO 155-852-130 MG PO TABS: 500.0000 mg | ORAL_TABLET | Freq: Two times a day (BID) | ORAL | Status: DC

## 2018-11-19 MED ORDER — POTASSIUM PHOSPHATES 15 MMOLE/5ML IV SOLN
30.0000 mmol | Freq: Once | INTRAVENOUS | Status: AC
  Administered 2018-11-19: 30 mmol via INTRAVENOUS
  Filled 2018-11-19 (×2): qty 10

## 2018-11-19 MED ORDER — AMOXICILLIN-POT CLAVULANATE 875-125 MG PO TABS
1.0000 | ORAL_TABLET | Freq: Two times a day (BID) | ORAL | Status: AC
  Administered 2018-11-19 – 2018-11-22 (×6): 1 via ORAL
  Filled 2018-11-19 (×6): qty 1

## 2018-11-19 MED ORDER — LOPERAMIDE HCL 2 MG PO CAPS
2.0000 mg | ORAL_CAPSULE | Freq: Once | ORAL | Status: AC
  Administered 2018-11-19: 2 mg via ORAL
  Filled 2018-11-19: qty 1

## 2018-11-19 MED ORDER — K PHOS MONO-SOD PHOS DI & MONO 155-852-130 MG PO TABS
500.0000 mg | ORAL_TABLET | Freq: Three times a day (TID) | ORAL | Status: AC
  Administered 2018-11-19 (×3): 500 mg via ORAL
  Filled 2018-11-19 (×3): qty 2

## 2018-11-19 NOTE — Progress Notes (Signed)
Multiple loose/watery stools noted, pt afebrile, notified MD D. Grandville Silos, new orders to d/c foley and place flexiseal. Flexiseal placed and external catheter put in place.

## 2018-11-19 NOTE — Progress Notes (Signed)
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  Patient admitted underwent thoracentesis of the right and left lungs on 6/2 and 6/3 and scheduled for repeat 3 days later. Patient was admitted respiratory status worsened and patient placed on the BiPAP and transferred to the stepdown unit.  Patient also placed on IV diuretics.  Patient noted to spike a fever and as such also placed on IV antibiotics for presumed aspiration pneumonia.  Due to tenuous respiratory status critical care consulted. Patient underwent left-sided thoracentesis on 11/15/2018 with 400 cc of back red pleural fluid removed.  Cytology from thoracentesis of 10/28/2018+ for malignant cells consistent with breast cancer.  COVID-19 negative.  IV antibiotics narrowed down to oral Augmentin starting 11/19/2018.  Lower extremity Dopplers which were done were negative.  Patient now on intermittent BiPAP mainly at night and on 3 L nasal cannula in the daytime however still with use of accessory muscles of respiration with poor air movement.  Palliative care consulted for goals of care.  Oncology following as well.   Assessment & Plan:   Principal Problem:   Acute on chronic respiratory failure with hypoxia (HCC) Active Problems:   Shortness of breath   Hypokalemia   Anemia   Bone metastasis (HCC)   Pleural effusion, bilateral   Malignant neoplasm of overlapping sites of right breast in female, estrogen receptor positive ([place])   Morbid obesity with BMI of 40.0-44.9, adult (HCC)   Carcinoma of  breast, estrogen receptor positive, stage 4, right (HCC)   Hypoalbuminemia   Palliative care by specialist   Pleural effusion, malignant   Hypophosphatemia   Hypocalcemia   Aspiration pneumonia ([place])  #1 acute on chronic hypoxic respiratory failure Felt likely multifactorial secondary to malignant pleural effusions, metastatic stage IV breast cancer with malignant effusion, possible aspiration pneumonia, volume overload. Patient now requiring noninvasive BiPAP on and off and wearing it intermittently.  Patient was on BiPAP overnight currently on 3 L nasal cannula.  Patient status post thoracentesis on the left 11/23/2018 with 400 cc of dark red pleural fluid removed.  Last chest x-ray done (6/24) was stable low volume chest with atelectasis and interstitial opacity.  CT chest done on 11/17/2018 with widespread metastatic disease to the bones redemonstrated with multiple nondisplaced minimally displaced bilateral rib fractures as well as new widely displaced fracture of the medial aspect of the right clavicle with probable adjacent intramuscular hemorrhage in the lower right sternocleidomastoid muscle and upper right pectoralis major muscle.  Probable tumor infiltration throughout the right breast as well as bilateral axillary and soft pectoral lymphadenopathy.  Unusual appearance of the lungs similar to prior study could suggest chronic systemic disease such as sarcoidosis or interstitial lung disease.  Possibility of lymphangitic spread of tumor in the lungs could be considered but not strongly favored.  Moderate left pleural effusion and small right pleural effusion.  Cholelithiasis.  Lower extremity Dopplers negative for DVT.  On IV Lasix 0.5 L over the past 24 hours.  Patient is -2.058 L during this hospitalization.  210 pounds from 233.69 pounds on admission.  Patient was  empirically on IV antibiotics of cefepime and subsequently Zosyn and has been transitioned to Augmentin per PCCM.  Continue pulmonary  hygiene.  Place on scheduled Xopenex and Atrovent nebs.  PCCM following and appreciate their input and recommendations.  2.  Malignant pleural effusion/loculated pleural effusion Status post thoracentesis on the left with 400 cc of dark red pleural fluid removed.  Cytology from thoracentesis 10/28/2018+ for malignant cells consistent with breast cancer.  Patient may require Pleurx catheter if recurrence of effusion however not at this time per PCCM.  Patient with extensive progressive disease.  Oncology and palliative care following.  Continue Arimidex.  Per oncology.  3.  Hypocalcemia Likely secondary to recent Xgeva injection given on 11/13/2018.  Increase oral calcium supplementation to 3 times daily.  Patient status post IV calcium gluconate on 11/18/2018.  We will give another gram of IV calcium gluconate today.  Keep magnesium greater than 2.  Follow.  4.  Hypokalemia Likely secondary to diuresis.  Replete.  Keep magnesium greater than 2.  5.  Bilateral lower extremity rash on feet and legs Unclear etiology.  Patient with some mild petechia.  May be secondary to transfusion obtained prior to admission.  Follow for now.  6.  Probable aspiration pneumonia Patient noted to be febrile during this hospitalization.  Fever curve trended down.  MRSA PCR negative.  Patient was on IV cefepime subsequently transitioned to IV Zosyn which have been discontinued.  Patient transition to oral Augmentin for 3 more days per pulmonary.  7.  Hypophosphatemia Replete.  8.  Microcytic anemia Patient received transfusion prior to admission.  Patient with no signs or symptoms of bleeding.  Hemoglobin at 8.1.  Follow.  Transfusion threshold hemoglobin less than 7.  9.  Volume overload in the setting of blood transfusion versus suspected acute diastolic CHF Patient noted to be short of breath posttransfusion.  Patient given IV Lasix during the hospitalization and currently on 40 IV every 12 hours down from 80 IV  every 12 hours.  2D echo from 10/27/2018 with a EF of 60 to 63%, diastolic Doppler parameters indeterminate.  1.5 L over the past 24 hours.  Patient is -2.058 L during this hospitalization.  Patient's current weight is 210.1 pounds from 233.69 pounds on admission.  Continue current dose of Lasix 40 mg IV every 12 hours.  Strict I's and O's.  Daily weights.  Follow.  10.  Obesity   DVT prophylaxis: Heparin Code Status: Full Family Communication: Updated patient.  No family at bedside. Disposition Plan: Remain in stepdown unit today.  Patient high risk for decompensation.   Consultants:   PCCM: Dr. Carson Myrtle 11/16/2018  Palliative care: Dr. Rowe Pavy 11/18/2018  Allergy: Dr. Jana Hakim 11/17/2018  Procedures:  6/02 Right Pleural Fluid Cytology >> positive for malignant cells c/w breast cancer  6/20 Left Thoracentesis >> 400 ml dark red pleural fluid removed  11/17/2018 lower extremity Dopplers Chest x-ray: 11/14/2018, 11/15/2018, 11/16/2018, 11/17/2018, 11/18/2018, 11/19/2018 CT chest 11/17/2018    Antimicrobials:  Cefepime 6/21  Zosyn 6/22-6/24 Augmentin 6/24 (for 3 days)  Micro Data:  COVID 6/19 >> negative     Subjective: Patient in bed getting cleaned up.  Patient states she is feeling a little bit better than she did yesterday.  Patient is feels shortness of breath is slowly improving.  Denies any chest pain.  No abdominal pain.  Per RN patient refusing scheduled morphine every 2 hours.  Objective: Vitals:   11/19/18 0300 11/19/18 0404 11/19/18 0500 11/19/18 0800  BP:  134/85  109/69   Pulse: 96  (!) 102   Resp:      Temp:  97.8 F (36.6 C)  98.5 F (36.9 C)  TempSrc:  Axillary  Oral  SpO2: 97%  100%   Weight:   95.3 kg   Height:        Intake/Output Summary (Last 24 hours) at 11/19/2018 1040 Last data filed at 11/19/2018 1000 Gross per 24 hour  Intake 442.88 ml  Output 2100 ml  Net -1657.12 ml   Filed Weights   11/16/18 1908 11/17/18 0421 11/19/18 0500  Weight: 98.3 kg  97.5 kg 95.3 kg    Examination:  General exam: Appears calm and comfortable  Respiratory system: Poor air movement.  Decreased breath sounds in the bases.  Use of accessory muscles of respiration.  Speaking in choppy sentences.  Cardiovascular system: Tachycardia.Marland Kitchen No JVD, murmurs, rubs, gallops or clicks.  Trace bilateral lower extremity edema.  Gastrointestinal system: Abdomen is nondistended, soft and nontender. No organomegaly or masses felt. Normal bowel sounds heard. Central nervous system: Alert and oriented. No focal neurological deficits. Extremities: Trace bilateral lower extremity edema.  Symmetric 5 x 5 power. Skin: Petechial rash on bilateral feet and distal lower extremities. Psychiatry: Judgement and insight appear normal. Mood & affect appropriate.     Data Reviewed: I have personally reviewed following labs and imaging studies  CBC: Recent Labs  Lab 11/14/18 1620 11/15/18 0543 11/16/18 0502 11/17/18 0215 11/18/18 0955 11/19/18 0231  WBC 7.7 8.3 9.3 7.7 7.8 8.0  NEUTROABS 4.9  --  6.6 5.4 5.2 5.1  HGB 9.5* 9.9* 9.0* 8.5* 9.0* 8.1*  HCT 30.7* 31.7* 28.9* 27.3* 27.7* 26.5*  MCV 100.0 101.0* 100.0 102.6* 99.6 103.9*  PLT 131* 135* PLATELET CLUMPS NOTED ON SMEAR, UNABLE TO ESTIMATE 101* 99* 96*   Basic Metabolic Panel: Recent Labs  Lab 11/14/18 1621 11/15/18 0543 11/16/18 0502 11/17/18 0215 11/18/18 0955 11/19/18 0231  NA  --  135 135 137 140 137  K  --  3.5 3.7 2.8* 3.4* 2.6*  CL  --  96* 94* 93* 96* 92*  CO2  --  30 29 30 27 31   GLUCOSE  --  155* 119* 117* 115* 114*  BUN  --  8 10 15 14 13   CREATININE  --  0.65 0.66 0.66 0.68 0.71  CALCIUM  --  6.4* 5.8* 5.2* 5.4* 5.1*  MG 2.4  --  1.9 2.2 2.0 1.9  PHOS 2.5  --  2.4* 2.3* 1.8* 1.8*   GFR: Estimated Creatinine Clearance: 81.5 mL/min (by C-G formula based on SCr of 0.71 mg/dL). Liver Function Tests: Recent Labs  Lab 11/15/18 0543 11/16/18 0502 11/17/18 0215 11/18/18 0955 11/19/18 0231  AST  36 31 22 22 22   ALT 22 18 16 15 14   ALKPHOS 295* 268* 208* 195* 194*  BILITOT 0.5 0.8 0.9 0.8 0.8  PROT 6.3* 5.9* 6.2* 6.3* 5.7*  ALBUMIN 2.1* 1.9* 2.3* 2.6* 2.3*   No results for input(s): LIPASE, AMYLASE in the last 168 hours. No results for input(s): AMMONIA in the last 168 hours. Coagulation Profile: Recent Labs  Lab 11/15/18 0543  INR 1.2   Cardiac Enzymes: No results for input(s): CKTOTAL, CKMB, CKMBINDEX, TROPONINI in the last 168 hours. BNP (last 3 results) No results for input(s): PROBNP in the last 8760 hours. HbA1C: No results for input(s): HGBA1C in the last 72 hours. CBG: No results for input(s): GLUCAP in the last 168 hours. Lipid Profile: No results  for input(s): CHOL, HDL, LDLCALC, TRIG, CHOLHDL, LDLDIRECT in the last 72 hours. Thyroid Function Tests: No results for input(s): TSH, T4TOTAL, FREET4, T3FREE, THYROIDAB in the last 72 hours. Anemia Panel: No results for input(s): VITAMINB12, FOLATE, FERRITIN, TIBC, IRON, RETICCTPCT in the last 72 hours. Sepsis Labs: Recent Labs  Lab 11/16/18 0910 11/16/18 1158 11/17/18 0215 11/18/18 0212  PROCALCITON 1.02  --  2.30 1.69  LATICACIDVEN 1.5 1.3  --   --     Recent Results (from the past 240 hour(s))  SARS Coronavirus 2 (CEPHEID - Performed in [place] hospital lab), Hosp Order     Status: None   Collection Time: 11/14/18  4:34 PM   Specimen: Nasopharyngeal Swab  Result Value Ref Range Status   SARS Coronavirus 2 NEGATIVE NEGATIVE Final    Comment: (NOTE) If result is NEGATIVE SARS-CoV-2 target nucleic acids are NOT DETECTED. The SARS-CoV-2 RNA is generally detectable in upper and lower  respiratory specimens during the acute phase of infection. The lowest  concentration of SARS-CoV-2 viral copies this assay can detect is 250  copies / mL. A negative result does not preclude SARS-CoV-2 infection  and should not be used as the sole basis for treatment or other  patient management decisions.  A negative  result may occur with  improper specimen collection / handling, submission of specimen other  than nasopharyngeal swab, presence of viral mutation(s) within the  areas targeted by this assay, and inadequate number of viral copies  (<250 copies / mL). A negative result must be combined with clinical  observations, patient history, and epidemiological information. If result is POSITIVE SARS-CoV-2 target nucleic acids are DETECTED. The SARS-CoV-2 RNA is generally detectable in upper and lower  respiratory specimens dur ing the acute phase of infection.  Positive  results are indicative of active infection with SARS-CoV-2.  Clinical  correlation with patient history and other diagnostic information is  necessary to determine patient infection status.  Positive results do  not rule out bacterial infection or co-infection with other viruses. If result is PRESUMPTIVE POSTIVE SARS-CoV-2 nucleic acids MAY BE PRESENT.   A presumptive positive result was obtained on the submitted specimen  and confirmed on repeat testing.  While 2019 novel coronavirus  (SARS-CoV-2) nucleic acids may be present in the submitted sample  additional confirmatory testing may be necessary for epidemiological  and / or clinical management purposes  to differentiate between  SARS-CoV-2 and other Sarbecovirus currently known to infect humans.  If clinically indicated additional testing with an alternate test  methodology [phone]) is advised. The SARS-CoV-2 RNA is generally  detectable in upper and lower respiratory sp ecimens during the acute  phase of infection. The expected result is Negative. Fact Sheet for Patients:  StrictlyIdeas.no Fact Sheet for Healthcare Providers: BankingDealers.co.za This test is not yet approved or cleared by the [place] FDA and has been authorized for detection and/or diagnosis of SARS-CoV-2 by FDA under an Emergency Use Authorization  (EUA).  This EUA will remain in effect (meaning this test can be used) for the duration of the COVID-19 declaration under Section 564(b)(1) of the Act, 21 U.S.C. section 360bbb-3(b)(1), unless the authorization is terminated or revoked sooner. Performed at [facility], [place] [address] [place], [place] [address]   MRSA PCR Screening     Status: None   Collection Time: 11/16/18  8:02 AM   Specimen: Nasal Mucosa; Nasopharyngeal  Result Value Ref Range Status   MRSA by PCR NEGATIVE NEGATIVE Final  Comment:        The GeneXpert MRSA Assay (FDA approved for NASAL specimens only), is one component of a comprehensive MRSA colonization surveillance program. It is not intended to diagnose MRSA infection nor to guide or monitor treatment for MRSA infections. Performed at [facility], [place] [address], [place], [place] [address]          Radiology Studies: Ct Chest Wo Contrast  Result Date: 11/17/2018 CLINICAL DATA:  58 year old female Caucasian female nonsmoker with history of bilateral lower extremity edema. Possible stage IV breast cancer. EXAM: CT CHEST WITHOUT CONTRAST TECHNIQUE: Multidetector CT imaging of the chest was performed following the standard protocol without IV contrast. COMPARISON:  Chest CT 10/26/2018. FINDINGS: Cardiovascular: Heart size is normal. There is no significant pericardial fluid, thickening or pericardial calcification. There is aortic atherosclerosis, as well as atherosclerosis of the great vessels of the mediastinum and the coronary arteries, including calcified atherosclerotic plaque in the left anterior descending and right coronary arteries. Mediastinum/Nodes: No pathologically enlarged mediastinal or hilar lymph nodes. Please note that accurate exclusion of hilar adenopathy is limited on noncontrast CT scans. Esophagus is unremarkable in appearance. Multiple borderline enlarged and mildly enlarged bilateral axillary  and subpectoral lymph nodes (right greater than left) measuring up to 15 mm in short axis on the right (axial image 49 of series 2). Lungs/Pleura: Extensive septal thickening with band like opacities throughout both lungs, similar to the prior study. No discrete pulmonary nodules or masses are confidently identified. Moderate left and small right pleural effusions. The left pleural effusion is predominantly sub pulmonic in position. Upper Abdomen: Several peripherally calcified gallstones lying dependently in the gallbladder measuring up to 1.9 cm in diameter. Aortic atherosclerosis. Musculoskeletal: Right breast is incompletely imaged, but there appears to be irregular soft tissue infiltration throughout much of the right breast tissue, which could reflect tumoral in for tray shin and/or edema. Diffuse lytic and sclerotic lesions throughout the visualized axial and appendicular skeleton, indicative of widespread metastatic disease to the bones. With appears very similar to the prior examination. There continue to be multiple nondisplaced to minimally displaced pathologic fractures throughout the ribs bilaterally. In addition, there is a new widely displaced fracture of the medial aspect of the right clavicle with approximately 1.4 cm of lateral and anterior displacement, as well as high attenuation thickening of the adjacent musculature in the upper right pectoralis major and lower right sternocleidomastoid muscles, most compatible with intramuscular hematoma and/or tumor. IMPRESSION: 1. Widespread metastatic disease to the bones redemonstrated, with multiple nondisplaced minimally displaced bilateral rib fractures, as well as a new widely displaced fracture of the medial aspect of the right clavicle with probable adjacent intramuscular hemorrhage in the lower right sternocleidomastoid muscle and upper right pectoralis major muscle. Probable tumoral infiltration throughout the right breast, as well as bilateral  axillary and subpectoral lymphadenopathy (right greater than left). 2. Unusual appearance of the lungs, similar to the prior study, which could suggest chronic systemic disease such as sarcoidosis or interstitial lung disease. The possibility of lymphangitic spread of tumor in the lungs could be considered, but is not strongly favored at this time. 3. Moderate left (predominantly subpulmonic) pleural effusion and small right pleural effusion. 4. Cholelithiasis. 5. Aortic atherosclerosis, in addition to 2 vessel coronary artery disease. Aortic Atherosclerosis (ICD10-I70.0). Electronically Signed   By: Vinnie Langton M.D.   On: 11/17/2018 14:55   Dg Chest Port 1 View  Result Date: 11/19/2018 CLINICAL DATA:  Shortness of breath EXAM: PORTABLE  CHEST 1 VIEW COMPARISON:  Yesterday FINDINGS: Very low volume chest with patchy bilateral infiltrate greater on the right. Some of these are streaky and suggestive of scarring or atelectasis. No visible effusion or pneumothorax. Largely obscured heart which is not enlarged by recent CT. Extensive osseous metastatic disease IMPRESSION: Stable low volume chest with atelectasis and interstitial opacity, reference chest CT from 2 days ago. Electronically Signed   By: Monte Fantasia M.D.   On: 11/19/2018 08:54   Dg Chest Port 1 View  Result Date: 11/18/2018 CLINICAL DATA:  Shortness of breath and increased weakness this morning; history metastatic breast cancer EXAM: PORTABLE CHEST 1 VIEW COMPARISON:  Portable exam 0823 hours compared to 11/17/2018 FINDINGS: Very low lung volumes. Normal heart size mediastinal contours. Patchy opacities in the lungs bilaterally favoring pneumonia over atelectasis. No pleural effusion or pneumothorax. Patchy osteosclerosis consistent with osseous metastatic disease. IMPRESSION: BILATERAL pulmonary opacities likely representing multifocal pneumonia. Electronically Signed   By: Lavonia Dana M.D.   On: 11/18/2018 09:30   Vas [place] Lower Extremity  Venous (dvt)  Result Date: 11/18/2018  Lower Venous Study Indications: Swelling.  Risk Factors: Cancer. Limitations: Body habitus and poor ultrasound/tissue interface. Comparison Study: No prior studies. Performing Technologist: Oliver Hum RVT  Examination Guidelines: A complete evaluation includes B-mode imaging, spectral Doppler, color Doppler, and power Doppler as needed of all accessible portions of each vessel. Bilateral testing is considered an integral part of a complete examination. Limited examinations for reoccurring indications may be performed as noted.  +---------+---------------+---------+-----------+----------+-------+  RIGHT     Compressibility Phasicity Spontaneity Properties Summary  +---------+---------------+---------+-----------+----------+-------+  CFV       Full            Yes       Yes                             +---------+---------------+---------+-----------+----------+-------+  SFJ       Full                                                      +---------+---------------+---------+-----------+----------+-------+  FV Prox   Full                                                      +---------+---------------+---------+-----------+----------+-------+  FV Mid    Full                                                      +---------+---------------+---------+-----------+----------+-------+  FV Distal Full                                                      +---------+---------------+---------+-----------+----------+-------+  PFV       Full                                                      +---------+---------------+---------+-----------+----------+-------+  POP       Full            Yes       Yes                             +---------+---------------+---------+-----------+----------+-------+  PTV       Full                                                      +---------+---------------+---------+-----------+----------+-------+  PERO      Full                                                       +---------+---------------+---------+-----------+----------+-------+   +---------+---------------+---------+-----------+----------+-------+  LEFT      Compressibility Phasicity Spontaneity Properties Summary  +---------+---------------+---------+-----------+----------+-------+  CFV       Full            Yes       Yes                             +---------+---------------+---------+-----------+----------+-------+  SFJ       Full                                                      +---------+---------------+---------+-----------+----------+-------+  FV Prox   Full                                                      +---------+---------------+---------+-----------+----------+-------+  FV Mid    Full                                                      +---------+---------------+---------+-----------+----------+-------+  FV Distal Full                                                      +---------+---------------+---------+-----------+----------+-------+  PFV       Full                                                      +---------+---------------+---------+-----------+----------+-------+  POP       Full            Yes       Yes                             +---------+---------------+---------+-----------+----------+-------+  PTV       Full                                                      +---------+---------------+---------+-----------+----------+-------+  PERO      Full                                                      +---------+---------------+---------+-----------+----------+-------+     Summary: Right: There is no evidence of deep vein thrombosis in the lower extremity. No cystic structure found in the popliteal fossa. Left: There is no evidence of deep vein thrombosis in the lower extremity. No cystic structure found in the popliteal fossa.  *See table(s) above for measurements and observations. Electronically signed by Deitra Mayo MD on 11/18/2018 at 11:42:50 AM.    Final          Scheduled Meds:  amoxicillin-clavulanate  1 tablet Oral Q12H   anastrozole  1 mg Oral Daily   calcium-vitamin D  1 tablet Oral BID   Chlorhexidine Gluconate Cloth  6 each Topical Daily   feeding supplement (ENSURE ENLIVE)  237 mL Oral BID BM   furosemide  40 mg Intravenous BID   heparin  5,000 Units Subcutaneous Q8H   ipratropium  0.5 mg Nebulization TID   levalbuterol  0.63 mg Nebulization TID   mouth rinse  15 mL Mouth Rinse q12n4p   phosphorus  500 mg Oral TID   Continuous Infusions:  sodium chloride Stopped (11/16/18 1407)   piperacillin-tazobactam (ZOSYN)  IV 3.375 g (11/19/18 0743)   potassium PHOSPHATE IVPB (in mmol)       LOS: 5 days    Time spent: 45 minutes    Irine Seal, MD [facility]  If 7PM-7AM, please contact night-coverage [email or web address] 11/19/2018, 10:40 AM

## 2018-11-19 NOTE — Progress Notes (Signed)
CRITICAL VALUE ALERT  Critical Value:  5.2 Calcium  Date & Time Notified: 6/24 and 1546  Provider Notified: MD Grandville Silos notified at 1600  Orders Received/Actions taken: Will continue to monitor and assess. Notified bedside RN, Baruch Merl., in regards to critical result as well.

## 2018-11-19 NOTE — Progress Notes (Signed)
CRITICAL VALUE ALERT  Critical Value: K+ 2.8, Ca+ 5.1  Date & Time Notied: 11/19/2018, 0400  Provider Notified: Lennox Grumbles, B   Orders Received/Actions taken: IV potassium and IV calcium gluconate

## 2018-11-19 NOTE — Progress Notes (Signed)
NAME:  Suzanne [name], MRN:  [id number], DOB:  01/16/1961, LOS: 5 ADMISSION DATE:  11/14/2018, CONSULTATION DATE:  6/21 REFERRING MD:  Dr. Alfredia Ferguson, CHIEF COMPLAINT:     Brief History   58 y/o F admitted 6/19 with SOB / DOE.  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 Received PRBC on 6/18 and developed SOB / DOE after transfusion.   Past Medical History  Stage IV Breast Cancer  Malignant Bilateral Pleural Effusions Chronic Hypoxic Respiratory Failure  Significant Hospital Events   6/18 Blood transfusion with SOB, DOE after  6/19 Admit   Consults:  PCCM 6/21  Procedures:  Left-sided thoracentesis for 400 cc of dark red fluid  Significant Diagnostic Tests:  6/02 Right Pleural Fluid Cytology >> positive for malignant cells c/w breast cancer  6/20 Left Thoracentesis >> 400 ml dark red pleural fluid removed   Micro Data:  COVID 6/19 >> negative   Antimicrobials:  Cefepime 6/21  Zosyn 6/22-6/24 Augmentin 6/24 (for 3 days)  Interim history/subjective:  Feels a little bit more comfortable today Uneventful night Feeling well at present  Objective   Blood pressure 109/69, pulse (!) 102, temperature 97.8 F (36.6 C), temperature source Axillary, resp. rate (!) 32, height 5\' 2"  (1.575 m), weight 95.3 kg, last menstrual period 05/28/2013, SpO2 100 %.        Intake/Output Summary (Last 24 hours) at 11/19/2018 0830 Last data filed at 11/19/2018 6967 Gross per 24 hour  Intake 442.88 ml  Output 1500 ml  Net -1057.12 ml   Filed Weights   11/16/18 1908 11/17/18 0421 11/19/18 0500  Weight: 98.3 kg 97.5 kg 95.3 kg    Examination: Elderly lady, does not appear to be in distress Reveals reactive S1-S2 appreciated Decreased air entry bilaterally, some rhonchi Abdomen is soft, bowel sounds appreciated Bilateral lower extremity edema Neurologically, alert and oriented x3, moving all extremities  CT scan of the chest reviewed showing  significant, extensive metastatic disease -Loculated pleural effusion  Resolved Hospital Problem list      Assessment & Plan:    Acute on chronic hypoxic respiratory failure Titrate FiO2 keep saturations greater than 90% BiPAP as needed for increased work of breathing Risk of decompensation remains quite significant Remains full code  Loculated pleural effusion Malignant pleural effusion Status post thoracentesis May require Pleurx catheter, not the right time to place one at present  Stage IV breast cancer with malignant effusion Oncology following Palliative care involvement may be of benefit as she is does have extensive, progressive disease  Bilateral lower extremity edema Recent echo showed normal systolic function, may have some diastolic dysfunction BNP is 37.5 Cautious diuresis Lower extremity negative for DVT   Concern for aspiration pneumonia-has been on Zosyn Will switch to Augmentin for 3 days   Best practice:  Diet: Per primary  DVT prophylaxis: heparin  Mobility: As tolerated  Code Status: Full Code  Family Communication: Attempted to call patient's daughter Suzanne [name] with no answer.  Message left for return call.  Disposition: SDU, per [facility]  Labs   CBC: Recent Labs  Lab 11/14/18 1620 11/15/18 0543 11/16/18 0502 11/17/18 0215 11/18/18 0955 11/19/18 0231  WBC 7.7 8.3 9.3 7.7 7.8 8.0  NEUTROABS 4.9  --  6.6 5.4 5.2 5.1  HGB 9.5* 9.9* 9.0* 8.5* 9.0* 8.1*  HCT 30.7* 31.7* 28.9* 27.3* 27.7* 26.5*  MCV 100.0 101.0* 100.0 102.6* 99.6 103.9*  PLT 131* 135* PLATELET CLUMPS NOTED ON SMEAR, UNABLE TO ESTIMATE 101* 99*  96*    Basic Metabolic Panel: Recent Labs  Lab 11/14/18 1621 11/15/18 0543 11/16/18 0502 11/17/18 0215 11/18/18 0955 11/19/18 0231  NA  --  135 135 137 140 137  K  --  3.5 3.7 2.8* 3.4* 2.6*  CL  --  96* 94* 93* 96* 92*  CO2  --  30 29 30 27 31   GLUCOSE  --  155* 119* 117* 115* 114*  BUN  --  8 10 15 14 13   CREATININE  --  0.65  0.66 0.66 0.68 0.71  CALCIUM  --  6.4* 5.8* 5.2* 5.4* 5.1*  MG 2.4  --  1.9 2.2 2.0 1.9  PHOS 2.5  --  2.4* 2.3* 1.8* 1.8*   GFR: Estimated Creatinine Clearance: 81.5 mL/min (by C-G formula based on SCr of 0.71 mg/dL). Recent Labs  Lab 11/16/18 0502 11/16/18 0910 11/16/18 1158 11/17/18 0215 11/18/18 0212 11/18/18 0955 11/19/18 0231  PROCALCITON  --  1.02  --  2.30 1.69  --   --   WBC 9.3  --   --  7.7  --  7.8 8.0  LATICACIDVEN  --  1.5 1.3  --   --   --   --     Liver Function Tests: Recent Labs  Lab 11/15/18 0543 11/16/18 0502 11/17/18 0215 11/18/18 0955 11/19/18 0231  AST 36 31 22 22 22   ALT 22 18 16 15 14   ALKPHOS 295* 268* 208* 195* 194*  BILITOT 0.5 0.8 0.9 0.8 0.8  PROT 6.3* 5.9* 6.2* 6.3* 5.7*  ALBUMIN 2.1* 1.9* 2.3* 2.6* 2.3*   No results for input(s): LIPASE, AMYLASE in the last 168 hours. No results for input(s): AMMONIA in the last 168 hours.  ABG    Component Value Date/Time   PHART 7.496 (H) 11/16/2018 2330   PCO2ART 44.1 11/16/2018 2330   PO2ART 82.5 (L) 11/16/2018 2330   HCO3 33.3 (H) 11/16/2018 2330   O2SAT 96.4 11/16/2018 2330     Coagulation Profile: Recent Labs  Lab 11/15/18 0543  INR 1.2    Cardiac Enzymes: No results for input(s): CKTOTAL, CKMB, CKMBINDEX, TROPONINI in the last 168 hours.  HbA1C: Hgb A1c MFr Bld  Date/Time Value Ref Range Status  10/27/2018 08:59 AM 5.3 4.8 - 5.6 % Final    Comment:    (NOTE) Pre diabetes:          5.7%-6.4% Diabetes:              >6.4% Glycemic control for   <7.0% adults with diabetes    The patient is critically ill with multiple organ system failure and requires high complexity decision making for assessment and support, frequent evaluation and titration of therapies, advanced monitoring, review of radiographic studies and interpretation of complex data.    Critical Care Time devoted to patient care services, exclusive of separately billable procedures, described in this note is 30  minutes.  Sherrilyn Rist, MD [place], PCCM Cell: [phone]

## 2018-11-19 NOTE — Progress Notes (Signed)
Daily Progress Note   Patient Name: Suzanne [name]       Date: 11/19/2018 DOB: 06-05-1960  Age: 58 y.o. MRN#: [id number] Attending Physician: Eugenie Filler, MD Primary Care Physician: Patient, No Pcp Per Admit Date: 11/14/2018  Reason for Consultation/Follow-up: Establishing goals of care  Subjective:  patient resting in chair, does not appear to be in distress  Length of Stay: 5  Current Medications: Scheduled Meds:  . amoxicillin-clavulanate  1 tablet Oral Q12H  . anastrozole  1 mg Oral Daily  . calcium-vitamin D  1 tablet Oral TID  . Chlorhexidine Gluconate Cloth  6 each Topical Daily  . feeding supplement (ENSURE ENLIVE)  237 mL Oral BID BM  . furosemide  40 mg Intravenous BID  . heparin  5,000 Units Subcutaneous Q8H  . ipratropium  0.5 mg Nebulization TID  . levalbuterol  0.63 mg Nebulization TID  . mouth rinse  15 mL Mouth Rinse q12n4p  . phosphorus  500 mg Oral TID    Continuous Infusions: . sodium chloride Stopped (11/16/18 1407)  . piperacillin-tazobactam (ZOSYN)  IV 12.5 mL/hr at 11/19/18 1100  . potassium PHOSPHATE IVPB (in mmol) 30 mmol (11/19/18 1136)    PRN Meds: sodium chloride, acetaminophen **OR** acetaminophen, bisacodyl, clotrimazole, levalbuterol, LORazepam, morphine injection, ondansetron **OR** ondansetron (ZOFRAN) IV, oxyCODONE, polyethylene glycol, senna-docusate  Physical Exam         Appears in no distress Appears to have normal work of breathing at present Has edema  Vital Signs: BP 109/69   Pulse (!) 102   Temp 98.5 F (36.9 C) (Oral)   Resp (!) 32   Ht 5\' 2"  (1.575 m)   Wt 95.3 kg   LMP 05/28/2013 (Within Months)   SpO2 100%   BMI 38.43 kg/m  SpO2: SpO2: 100 % O2 Device: O2 Device: Nasal Cannula O2 Flow Rate: O2 Flow Rate  (L/min): 5 L/min  Intake/output summary:   Intake/Output Summary (Last 24 hours) at 11/19/2018 1218 Last data filed at 11/19/2018 1100 Gross per 24 hour  Intake 839.06 ml  Output 2425 ml  Net -1585.94 ml   LBM: Last BM Date: 11/18/18 Baseline Weight: Weight: 106 kg Most recent weight: Weight: 95.3 kg       Palliative Assessment/Data:    Flowsheet Rows     Most Recent Value  Intake Tab  Referral Department  Hospitalist  Unit at Time of Referral  Intermediate Care Unit  Palliative Care Primary Diagnosis  Cancer  Palliative Care Type  New Palliative care  Date first seen by Palliative Care  11/18/18  Clinical Assessment  Palliative Performance Scale Score  40%  Pain Max last 24 hours  4  Pain Min Last 24 hours  3  Dyspnea Max Last 24 Hours  4  Dyspnea Min Last 24 hours  3  Nausea Max Last 24 Hours  3  Nausea Min Last 24 Hours  2  Anxiety Max Last 24 Hours  5  Anxiety Min Last 24 Hours  4  Psychosocial & Spiritual Assessment  Palliative Care Outcomes      Patient Active Problem List   Diagnosis Date Noted  . Acute on chronic respiratory failure with hypoxia ([place]) 11/19/2018  . Pleural effusion, malignant 11/19/2018  . Hypophosphatemia 11/19/2018  . Hypocalcemia 11/19/2018  . Aspiration pneumonia ([place]) 11/19/2018  . Palliative care by specialist   . Carcinoma of breast, estrogen receptor positive, stage 4, right ([place]) 11/16/2018  . Hypoalbuminemia 11/16/2018  . Pleural effusion 11/14/2018  . Goals of care, counseling/discussion 11/06/2018  . Malignant neoplasm of overlapping sites of right breast in female, estrogen receptor positive ([place]) 10/31/2018  . Lung metastasis ([place]) 10/31/2018  . Pain from bone metastases ([place]) 10/31/2018  . Morbid obesity with BMI of 40.0-44.9, adult ([place]) 10/31/2018  . Bone injury   . Pleural effusion, bilateral   . Shortness of breath   . Abnormal breast finding   . Hypokalemia   . Breast skin changes   . Anemia   . Bone  metastasis ([place])   . Acute anemia 10/26/2018    Palliative Care Assessment & Plan   Patient Profile:    Assessment: 1.Malignant pleural effusions, metastatic stage IV breast cancer with malignant effusion, possible aspiration pneumonia, volume overload causing acute on chronic hypoxic respiratory failure.  2. Anxiety, shortness of breath.    Recommendations/Plan:  med history noted, continue current mode of care Home with home PT and home health, with her daughter, recommend out patient palliative and regular med onc follow up after discharge.  No additional PMT specific recommendations at this time, at time of initial consult, goals of care discussions were held, patient is"optimistic" after having discussed with her oncologist about her treatment options for her cancer. Continue current plan.    Code Status:    Code Status Orders  (From admission, onward)         Start     Ordered   11/16/18 2007  Full code  Continuous     11/16/18 2008        Code Status History    Date Active Date Inactive Code Status Order ID Comments User Context   11/16/2018 1916 11/16/2018 2008 DNR [id number]  Laurin Coder, MD Inpatient   11/14/2018 2104 11/16/2018 1916 Full Code [id number]  Harvie Bridge, DO Inpatient   10/26/2018 2342 10/30/2018 2112 Full Code [id number]  Danna Hefty, DO ED   Advance Care Planning Activity       Prognosis:   Unable to determine  Discharge Planning:  Home with Home Health Recommend out patient palliative care.   Care plan was discussed with    Thank you for allowing the Palliative Medicine Team to assist in the care of this patient.   Time In:  10 Time Out: 10.25 Total Time 25 Prolonged Time Billed  no  Greater than 50%  of this time was spent counseling and coordinating care related to the above assessment and plan.  Loistine Chance, MD [phone] Please contact Palliative Medicine Team phone at [phone] for questions and concerns.

## 2018-11-20 ENCOUNTER — Encounter: Payer: Self-pay | Admitting: General Practice

## 2018-11-20 ENCOUNTER — Inpatient Hospital Stay (HOSPITAL_COMMUNITY): Payer: Medicaid Other

## 2018-11-20 DIAGNOSIS — J910 Malignant pleural effusion: Secondary | ICD-10-CM

## 2018-11-20 LAB — CBC WITH DIFFERENTIAL/PLATELET
Abs Immature Granulocytes: 0.44 10*3/uL — ABNORMAL HIGH (ref 0.00–0.07)
Basophils Absolute: 0 10*3/uL (ref 0.0–0.1)
Basophils Relative: 0 %
Eosinophils Absolute: 0.2 10*3/uL (ref 0.0–0.5)
Eosinophils Relative: 2 %
HCT: 26.7 % — ABNORMAL LOW (ref 36.0–46.0)
Hemoglobin: 8 g/dL — ABNORMAL LOW (ref 12.0–15.0)
Immature Granulocytes: 5 %
Lymphocytes Relative: 28 %
Lymphs Abs: 2.6 10*3/uL (ref 0.7–4.0)
MCH: 31.3 pg (ref 26.0–34.0)
MCHC: 30 g/dL (ref 30.0–36.0)
MCV: 104.3 fL — ABNORMAL HIGH (ref 80.0–100.0)
Monocytes Absolute: 0.4 10*3/uL (ref 0.1–1.0)
Monocytes Relative: 4 %
Neutro Abs: 5.5 10*3/uL (ref 1.7–7.7)
Neutrophils Relative %: 61 %
Platelets: 97 10*3/uL — ABNORMAL LOW (ref 150–400)
RBC: 2.56 MIL/uL — ABNORMAL LOW (ref 3.87–5.11)
RDW: 18.3 % — ABNORMAL HIGH (ref 11.5–15.5)
WBC: 9.1 10*3/uL (ref 4.0–10.5)
nRBC: 0.7 % — ABNORMAL HIGH (ref 0.0–0.2)

## 2018-11-20 LAB — BASIC METABOLIC PANEL
Anion gap: 13 (ref 5–15)
BUN: 12 mg/dL (ref 6–20)
CO2: 33 mmol/L — ABNORMAL HIGH (ref 22–32)
Calcium: 5.1 mg/dL — CL (ref 8.9–10.3)
Chloride: 93 mmol/L — ABNORMAL LOW (ref 98–111)
Creatinine, Ser: 0.58 mg/dL (ref 0.44–1.00)
GFR calc Af Amer: >60 mL/min (ref >60)
GFR calc non Af Amer: >60 mL/min (ref >60)
Glucose, Bld: 156 mg/dL — ABNORMAL HIGH (ref 70–99)
Potassium: 3.1 mmol/L — ABNORMAL LOW (ref 3.5–5.1)
Sodium: 139 mmol/L (ref 135–145)

## 2018-11-20 LAB — PHOSPHORUS: Phosphorus: 2.5 mg/dL (ref 2.5–4.6)

## 2018-11-20 LAB — MAGNESIUM: Magnesium: 2 mg/dL (ref 1.7–2.4)

## 2018-11-20 LAB — GLUCOSE, CAPILLARY: Glucose-Capillary: 140 mg/dL — ABNORMAL HIGH (ref 70–99)

## 2018-11-20 MED ORDER — FUROSEMIDE 10 MG/ML IJ SOLN
20.0000 mg | Freq: Once | INTRAMUSCULAR | Status: AC
  Administered 2018-11-20: 20 mg via INTRAVENOUS
  Filled 2018-11-20: qty 2

## 2018-11-20 MED ORDER — MAGNESIUM SULFATE IN D5W 1-5 GM/100ML-% IV SOLN
1.0000 g | Freq: Once | INTRAVENOUS | Status: AC
  Administered 2018-11-20: 1 g via INTRAVENOUS
  Filled 2018-11-20: qty 100

## 2018-11-20 MED ORDER — POTASSIUM CHLORIDE CRYS ER 20 MEQ PO TBCR
40.0000 meq | EXTENDED_RELEASE_TABLET | ORAL | Status: AC
  Administered 2018-11-20 (×2): 40 meq via ORAL
  Filled 2018-11-20 (×2): qty 2

## 2018-11-20 MED ORDER — CALCIUM GLUCONATE-NACL 1-0.675 GM/50ML-% IV SOLN
1.0000 g | INTRAVENOUS | Status: AC
  Administered 2018-11-20 (×3): 1000 mg via INTRAVENOUS
  Filled 2018-11-20 (×3): qty 50

## 2018-11-20 MED ORDER — ENOXAPARIN SODIUM 30 MG/0.3ML ~~LOC~~ SOLN
30.0000 mg | Freq: Every day | SUBCUTANEOUS | Status: DC
  Administered 2018-11-20 – 2018-11-24 (×5): 30 mg via SUBCUTANEOUS
  Filled 2018-11-20 (×5): qty 0.3

## 2018-11-20 MED ORDER — FUROSEMIDE 10 MG/ML IJ SOLN
60.0000 mg | Freq: Three times a day (TID) | INTRAMUSCULAR | Status: DC
  Administered 2018-11-20 – 2018-11-22 (×6): 60 mg via INTRAVENOUS
  Filled 2018-11-20 (×6): qty 6

## 2018-11-20 MED ORDER — LOPERAMIDE HCL 2 MG PO CAPS
2.0000 mg | ORAL_CAPSULE | ORAL | Status: DC | PRN
  Filled 2018-11-20 (×2): qty 1

## 2018-11-20 MED ORDER — POTASSIUM CHLORIDE CRYS ER 20 MEQ PO TBCR: 40.0000 meq | EXTENDED_RELEASE_TABLET | Freq: Every day | ORAL | Status: DC

## 2018-11-20 MED ORDER — LOPERAMIDE HCL 2 MG PO CAPS
4.0000 mg | ORAL_CAPSULE | Freq: Once | ORAL | Status: AC
  Administered 2018-11-20: 4 mg via ORAL

## 2018-11-20 NOTE — Progress Notes (Signed)
CRITICAL VALUE ALERT  Critical Value:  Calcium 5.1  Date & Time Notied:  11/20/2018 0315  Provider Notified: Lorra Hals  Orders Received/Actions taken: awaiting orders

## 2018-11-20 NOTE — Progress Notes (Addendum)
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  Patient admitted underwent thoracentesis of the right and left lungs on 6/2 and 6/3 and scheduled for repeat 3 days later. Patient was admitted respiratory status worsened and patient placed on the BiPAP and transferred to the stepdown unit.  Patient also placed on IV diuretics.  Patient noted to spike a fever and as such also placed on IV antibiotics for presumed aspiration pneumonia.  Due to tenuous respiratory status critical care consulted. Patient underwent left-sided thoracentesis on 11/15/2018 with 400 cc of back red pleural fluid removed.  Cytology from thoracentesis of 10/28/2018+ for malignant cells consistent with breast cancer.  COVID-19 negative.  IV antibiotics narrowed down to oral Augmentin starting 11/19/2018.  Lower extremity Dopplers which were done were negative.  Patient now on intermittent BiPAP mainly at night and on 3 L nasal cannula in the daytime however still with use of accessory muscles of respiration with poor air movement.  Palliative care consulted for goals of care.  Oncology following as well.   Assessment & Plan:   Principal Problem:   Acute on chronic respiratory failure with hypoxia (HCC) Active Problems:   Shortness of breath   Hypokalemia   Anemia   Bone metastasis (HCC)   Pleural effusion, bilateral   Malignant neoplasm of overlapping sites of right breast in female, estrogen receptor positive ([place])   Morbid obesity with BMI of 40.0-44.9, adult (HCC)   Carcinoma of  breast, estrogen receptor positive, stage 4, right (HCC)   Hypoalbuminemia   Palliative care by specialist   Pleural effusion, malignant   Hypophosphatemia   Hypocalcemia   Aspiration pneumonia (HCC)   SOB (shortness of breath)  1 acute on chronic hypoxic respiratory failure Felt likely multifactorial secondary to malignant pleural effusions, metastatic stage IV breast cancer with malignant effusion, possible aspiration pneumonia, volume overload. Patient now requiring noninvasive BiPAP on and off and wearing it intermittently.  Patient was on BiPAP overnight currently on 7 L nasal cannula.  Patient status post thoracentesis on the left 11/23/2018 with 400 cc of dark red pleural fluid removed.  Last chest x-ray done (6/24) was stable low volume chest with atelectasis and interstitial opacity.  CT chest done on 11/17/2018 with widespread metastatic disease to the bones redemonstrated with multiple nondisplaced minimally displaced bilateral rib fractures as well as new widely displaced fracture of the medial aspect of the right clavicle with probable adjacent intramuscular hemorrhage in the lower right sternocleidomastoid muscle and upper right pectoralis major muscle.  Probable tumor infiltration throughout the right breast as well as bilateral axillary and soft pectoral lymphadenopathy.  Unusual appearance of the lungs similar to prior study could suggest chronic systemic disease such as sarcoidosis or interstitial lung disease.  Possibility of lymphangitic spread of tumor in the lungs could be considered but not strongly favored.  Moderate left pleural effusion and small right pleural effusion.  Cholelithiasis.  Lower extremity Dopplers negative for DVT.  COVID-19 test negative.  Patient still with increased O2 requirements and shortness of breath with use of accessory muscles of respiration.  On  IV Lasix with a urine output of 1.4 L over the past 24 hours.  Patient is - 3.013 L during this  hospitalization.  210 pounds from 233.69 pounds on admission.  Patient was empirically on IV antibiotics of cefepime and subsequently Zosyn and has been transitioned to Augmentin per PCCM.  Continue pulmonary hygiene.  Continue scheduled Xopenex and Atrovent nebs.  Increase Lasix to 60 mg IV every 8 hours.  Strict I's and O's.  Daily weights.  PCCM following and appreciate their input and recommendations.  2.  Malignant pleural effusion/loculated pleural effusion Status post thoracentesis on the left with 400 cc of dark red pleural fluid removed.  Cytology from thoracentesis 10/28/2018+ for malignant cells consistent with breast cancer.  Patient may require Pleurx catheter if recurrence of effusion however not at this time per PCCM.  Patient with extensive progressive disease.  Oncology and palliative care following.  Continue Arimidex.  Per oncology.  3.  Hypocalcemia Likely secondary to recent Xgeva injection given on 11/13/2018.  Increased oral calcium supplementation to 3 times daily.  Patient status post IV calcium gluconate on 11/18/2018.  Patient has been ordered IV calcium gluconate x1 this morning.  We will give another dose of IV calcium gluconate.  Keep magnesium greater than 2.  Follow.   4.  Hypokalemia Likely secondary to diuresis.  K. Dur 40 mEq p.o. every 4 hours x2 doses.  Magnesium at 2.0.  Follow.   5.  Bilateral lower extremity rash on feet and legs Unclear etiology.  Patient with some mild petechia.  May be secondary to transfusion obtained prior to admission.  Follow for now.  6.  Probable aspiration pneumonia Patient noted to be febrile during this hospitalization.  Fever curve trended down.  MRSA PCR negative.  Patient was on IV cefepime subsequently transitioned to IV Zosyn which have been discontinued.  Patient transitioned to oral Augmentin for 3 more days per pulmonary.  7.  Hypophosphatemia Repleted.  8.  Microcytic anemia Patient received transfusion prior to admission.   Patient with no signs or symptoms of bleeding.  Hemoglobin at 8.0.  Follow.  Transfusion threshold hemoglobin less than 7.  9.  Volume overload in the setting of blood transfusion versus suspected acute diastolic CHF Patient noted to be short of breath posttransfusion.  Patient given IV Lasix during the hospitalization and currently on 40 IV every 12 hours down from 80 IV every 12 hours.  2D echo from 10/27/2018 with a EF of 60 to 37%, diastolic Doppler parameters indeterminate.  1.4 L over the past 24 hours.  Patient is - 3.013 L during this hospitalization.  Patient's current weight is 210.1 pounds from 233.69 pounds on admission.  Increase Lasix to 60 mg IV every 8 hours.  Patient already received Lasix 40 mg IV x1 this morning as such we will give an extra 20 mg of IV Lasix x1. Strict I's and O's.  Daily weights.  Follow.  10.  Obesity  11.  Diarrhea Patient with loose stools.  Patient afebrile.  Patient with no significant abdominal pain.  Rectal pouch in place.  Imodium 4 mg p.o. x1.  Imodium as needed.   DVT prophylaxis: Lovenox. Code Status: Full Family Communication: Updated patient.  No family at bedside. Disposition Plan: Remain in stepdown unit today.  Patient high risk for decompensation.  Patient on high levels of O2.   Consultants:   PCCM: Dr. Carson Myrtle 11/16/2018  Palliative care: Dr. Rowe Pavy 11/18/2018  Allergy: Dr. Jana Hakim 11/17/2018  Procedures:  6/02 Right Pleural Fluid Cytology >> positive for malignant cells c/w breast cancer  6/20 Left Thoracentesis >> 400 ml dark red pleural fluid removed  11/17/2018 lower extremity Dopplers Chest x-ray: 11/14/2018, 11/15/2018, 11/16/2018, 11/17/2018, 11/18/2018, 11/19/2018 CT chest 11/17/2018    Antimicrobials:  Cefepime 6/21  Zosyn 6/22-6/24 Augmentin 6/24 (for 3 days)  Micro Data:  COVID 6/19 >> negative     Subjective: Patient laying in bed with some complaints of shortness of breath.  Patient on 7 L nasal cannula.  Patient  with use of accessory muscles of respiration.  Patient noted to have multiple loose stools yesterday currently with rectal tube with some brown loose stools.  Patient denies any chest pain.  No abdominal pain.  Patient tearful.  Objective: Vitals:   11/20/18 0323 11/20/18 0338 11/20/18 0400 11/20/18 0500  BP:   126/76 (!) 136/54  Pulse:   (!) 107 (!) 106  Resp:   (!) 22 (!) 22  Temp:  98.2 F (36.8 C)    TempSrc:  Oral    SpO2:   99% 99%  Weight: 95.3 kg     Height:        Intake/Output Summary (Last 24 hours) at 11/20/2018 1009 Last data filed at 11/20/2018 0529 Gross per 24 hour  Intake 136.12 ml  Output 875 ml  Net -738.88 ml   Filed Weights   11/17/18 0421 11/19/18 0500 11/20/18 0323  Weight: 97.5 kg 95.3 kg 95.3 kg    Examination:  General exam: In mild respiratory distress. Respiratory system: Poor air movement.  Diffuse crackles noted.  Use of accessory muscles of respiration.  Thoracoabdominal breathing.  Speaking in choppy sentences.  Cardiovascular system: Tachycardia.Marland Kitchen+ JVD, murmurs, rubs, gallops or clicks.  1-2+ bilateral lower extremity edema.  Gastrointestinal system: Abdomen is nondistended, soft and nontender. No organomegaly or masses felt. Normal bowel sounds heard. Central nervous system: Alert and oriented. No focal neurological deficits. Extremities: 1-2+ bilateral lower extremity edema.  Symmetric 5 x 5 power. Skin: Slowly improving petechial rash on bilateral feet and distal lower extremities. Psychiatry: Judgement and insight appear normal. Mood & affect appropriate.     Data Reviewed: I have personally reviewed following labs and imaging studies  CBC: Recent Labs  Lab 11/16/18 0502 11/17/18 0215 11/18/18 0955 11/19/18 0231 11/20/18 0223  WBC 9.3 7.7 7.8 8.0 9.1  NEUTROABS 6.6 5.4 5.2 5.1 5.5  HGB 9.0* 8.5* 9.0* 8.1* 8.0*  HCT 28.9* 27.3* 27.7* 26.5* 26.7*  MCV 100.0 102.6* 99.6 103.9* 104.3*  PLT PLATELET CLUMPS NOTED ON SMEAR, UNABLE TO  ESTIMATE 101* 99* 96* 97*   Basic Metabolic Panel: Recent Labs  Lab 11/16/18 0502 11/17/18 0215 11/18/18 0955 11/19/18 0231 11/19/18 1511 11/20/18 0223  NA 135 137 140 137 140 139  K 3.7 2.8* 3.4* 2.6* 3.0* 3.1*  CL 94* 93* 96* 92* 92* 93*  CO2 29 30 27 31  36* 33*  GLUCOSE 119* 117* 115* 114* 208* 156*  BUN 10 15 14 13 12 12   CREATININE 0.66 0.66 0.68 0.71 0.65 0.58  CALCIUM 5.8* 5.2* 5.4* 5.1* 5.2* 5.1*  MG 1.9 2.2 2.0 1.9  --  2.0  PHOS 2.4* 2.3* 1.8* 1.8*  --  2.5   GFR: Estimated Creatinine Clearance: 81.5 mL/min (by C-G formula based on SCr of 0.58 mg/dL). Liver Function Tests: Recent Labs  Lab 11/15/18 0543 11/16/18 0502 11/17/18 0215 11/18/18 0955 11/19/18 0231  AST 36 31 22 22 22   ALT 22 18 16 15 14   ALKPHOS  295* 268* 208* 195* 194*  BILITOT 0.5 0.8 0.9 0.8 0.8  PROT 6.3* 5.9* 6.2* 6.3* 5.7*  ALBUMIN 2.1* 1.9* 2.3* 2.6* 2.3*   No results for input(s): LIPASE, AMYLASE in the last 168 hours. No results for input(s): AMMONIA in the last 168 hours. Coagulation Profile: Recent Labs  Lab 11/15/18 0543  INR 1.2   Cardiac Enzymes: No results for input(s): CKTOTAL, CKMB, CKMBINDEX, TROPONINI in the last 168 hours. BNP (last 3 results) No results for input(s): PROBNP in the last 8760 hours. HbA1C: No results for input(s): HGBA1C in the last 72 hours. CBG: No results for input(s): GLUCAP in the last 168 hours. Lipid Profile: No results for input(s): CHOL, HDL, LDLCALC, TRIG, CHOLHDL, LDLDIRECT in the last 72 hours. Thyroid Function Tests: No results for input(s): TSH, T4TOTAL, FREET4, T3FREE, THYROIDAB in the last 72 hours. Anemia Panel: No results for input(s): VITAMINB12, FOLATE, FERRITIN, TIBC, IRON, RETICCTPCT in the last 72 hours. Sepsis Labs: Recent Labs  Lab 11/16/18 0910 11/16/18 1158 11/17/18 0215 11/18/18 0212  PROCALCITON 1.02  --  2.30 1.69  LATICACIDVEN 1.5 1.3  --   --     Recent Results (from the past 240 hour(s))  SARS Coronavirus  2 (CEPHEID - Performed in [place] hospital lab), Hosp Order     Status: None   Collection Time: 11/14/18  4:34 PM   Specimen: Nasopharyngeal Swab  Result Value Ref Range Status   SARS Coronavirus 2 NEGATIVE NEGATIVE Final    Comment: (NOTE) If result is NEGATIVE SARS-CoV-2 target nucleic acids are NOT DETECTED. The SARS-CoV-2 RNA is generally detectable in upper and lower  respiratory specimens during the acute phase of infection. The lowest  concentration of SARS-CoV-2 viral copies this assay can detect is 250  copies / mL. A negative result does not preclude SARS-CoV-2 infection  and should not be used as the sole basis for treatment or other  patient management decisions.  A negative result may occur with  improper specimen collection / handling, submission of specimen other  than nasopharyngeal swab, presence of viral mutation(s) within the  areas targeted by this assay, and inadequate number of viral copies  (<250 copies / mL). A negative result must be combined with clinical  observations, patient history, and epidemiological information. If result is POSITIVE SARS-CoV-2 target nucleic acids are DETECTED. The SARS-CoV-2 RNA is generally detectable in upper and lower  respiratory specimens dur ing the acute phase of infection.  Positive  results are indicative of active infection with SARS-CoV-2.  Clinical  correlation with patient history and other diagnostic information is  necessary to determine patient infection status.  Positive results do  not rule out bacterial infection or co-infection with other viruses. If result is PRESUMPTIVE POSTIVE SARS-CoV-2 nucleic acids MAY BE PRESENT.   A presumptive positive result was obtained on the submitted specimen  and confirmed on repeat testing.  While 2019 novel coronavirus  (SARS-CoV-2) nucleic acids may be present in the submitted sample  additional confirmatory testing may be necessary for epidemiological  and / or clinical  management purposes  to differentiate between  SARS-CoV-2 and other Sarbecovirus currently known to infect humans.  If clinically indicated additional testing with an alternate test  methodology [phone]) is advised. The SARS-CoV-2 RNA is generally  detectable in upper and lower respiratory sp ecimens during the acute  phase of infection. The expected result is Negative. Fact Sheet for Patients:  StrictlyIdeas.no Fact Sheet for Healthcare Providers: BankingDealers.co.za This test is not  yet approved or cleared by the [place] and has been authorized for detection and/or diagnosis of SARS-CoV-2 by FDA under an Emergency Use Authorization (EUA).  This EUA will remain in effect (meaning this test can be used) for the duration of the COVID-19 declaration under Section 564(b)(1) of the Act, 21 U.S.C. section 360bbb-3(b)(1), unless the authorization is terminated or revoked sooner. Performed at [facility], [place] [address] [place], [place] [address]   MRSA PCR Screening     Status: None   Collection Time: 11/16/18  8:02 AM   Specimen: Nasal Mucosa; Nasopharyngeal  Result Value Ref Range Status   MRSA by PCR NEGATIVE NEGATIVE Final    Comment:        The GeneXpert MRSA Assay (FDA approved for NASAL specimens only), is one component of a comprehensive MRSA colonization surveillance program. It is not intended to diagnose MRSA infection nor to guide or monitor treatment for MRSA infections. Performed at [facility], [place] [address], [place], [place] [address]          Radiology Studies: Dg Chest Port 1 View  Result Date: 11/19/2018 CLINICAL DATA:  Shortness of breath EXAM: PORTABLE CHEST 1 VIEW COMPARISON:  Yesterday FINDINGS: Very low volume chest with patchy bilateral infiltrate greater on the right. Some of these are streaky and suggestive of scarring or atelectasis. No visible  effusion or pneumothorax. Largely obscured heart which is not enlarged by recent CT. Extensive osseous metastatic disease IMPRESSION: Stable low volume chest with atelectasis and interstitial opacity, reference chest CT from 2 days ago. Electronically Signed   By: Monte Fantasia M.D.   On: 11/19/2018 08:54        Scheduled Meds:  amoxicillin-clavulanate  1 tablet Oral Q12H   anastrozole  1 mg Oral Daily   calcium-vitamin D  1 tablet Oral TID   Chlorhexidine Gluconate Cloth  6 each Topical Daily   feeding supplement (ENSURE ENLIVE)  237 mL Oral BID BM   furosemide  40 mg Intravenous BID   heparin  5,000 Units Subcutaneous Q8H   ipratropium  0.5 mg Nebulization TID   levalbuterol  0.63 mg Nebulization TID   mouth rinse  15 mL Mouth Rinse q12n4p   potassium chloride  40 mEq Oral Q4H   [START ON 11/21/2018] potassium chloride  40 mEq Oral Daily   Continuous Infusions:  sodium chloride Stopped (11/16/18 1407)     LOS: 6 days    Time spent: 45 minutes    Irine Seal, MD [facility]  If 7PM-7AM, please contact night-coverage [email or web address] 11/20/2018, 10:09 AM

## 2018-11-20 NOTE — Progress Notes (Signed)
PT Cancellation Note  Patient Details Name: Suzanne [name] MRN: [id number] DOB: 03/18/1961   Cancelled Treatment:    Reason Eval/Treat Not Completed: Medical issues which prohibited therapy - Pt with tachypnea up to 36 breaths/minute with answering yes/no questions, very increased work of breathing at this time. PT to check back as schedule allows.  Julien Girt, PT Acute Rehabilitation Services Pager [phone]  Office [phone]    [name] 11/20/2018, 12:57 PM

## 2018-11-20 NOTE — Progress Notes (Signed)
OT Cancellation Note  Patient Details Name: Suzanne [name] MRN: [id number] DOB: 06/01/1960   Cancelled Treatment:    Reason Eval/Treat Not Completed: Medical issues which prohibited therapy.  Pt agreeable to therapy but RR increased up to 37 when awake and not moving. Will check another time.   Addilee Neu 11/20/2018, 12:32 PM  Lesle Chris, OTR/L Acute Rehabilitation Services [phone] pager [phone] office 11/20/2018

## 2018-11-20 NOTE — Progress Notes (Signed)
   11/20/18 1200  Clinical Encounter Type  Visited With Patient  Visit Type Initial;Psychological support;Spiritual support  Referral From Nurse  Consult/Referral To Chaplain  Spiritual Encounters  Spiritual Needs Emotional;Brochure;Other (Comment) (Advance Directive )  Stress Factors  Patient Stress Factors Health changes;Other (Comment) (Advance Directive)  Advance Directives (For Healthcare)  Does Patient Have a Medical Advance Directive? No  Would patient like information on creating a medical advance directive? Yes (Inpatient - patient requests chaplain consult to create a medical advance directive) (Patient Given Paperwork )   I visited with the patient per spiritual care consult to discuss an advance directive. Suzanne [name] was very weak during our brief conversation. I left the paperwork for her to look over when she feels better. She stated that she wants her daughter to be her healthcare power of attorney.   Please, contact Spiritual Care for further assistance.   Chaplain Shanon Ace M.Div., [facility]

## 2018-11-20 NOTE — Progress Notes (Signed)
[place] CSW Progress Notes  Call from Joanette Gula [phone] cell; [phone] office), [place] Work Navigator.  Pt receiving home health services from [facility], services arranged after last hospital discharge.  Juliann Pulse has investigated option of [employer] ([place] - no phone number provided) providing home O2 and hospital bed for patient - [employer] states that they are unable to provide these items under charity care, "unless it is arranged at hospital discharge."  CSW also spoke w palliative care MD yesterday, outlining concerns about patient's need for home health, current lack of insurance, daughter's request that mother complete Advance Directives during current inpatient stay to facilitate increased family communication, and psychosocial needs at home where patient cares for adult daughter with cerebral palsy (reportedly 'mild').  Adult daughter has been referred to agencies that can assist her w obtaining insurance/Medicaid and apply for disability.  Family currently has no income coming into the home as mother is breadwinner and is unable to work at present.  Financial advocates [employer] and [employer] are both working w patient on linking to resources for [employer] and SSA disability (if patient desires to apply).  CSW standing by to assist w needs after patient discharges from inpatient stay and returns to [place].  [name], LCSW Clinical Social Worker Phone:  [phone]

## 2018-11-20 NOTE — Progress Notes (Signed)
NAME:  Suzanne [name], MRN:  [id number], DOB:  05-30-1960, LOS: 6 ADMISSION DATE:  11/14/2018, CONSULTATION DATE:  6/21 REFERRING MD:  Dr. Alfredia Ferguson, CHIEF COMPLAINT:     Brief History   58 y/o F admitted 6/19 with SOB / DOE.  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 Received PRBC on 6/18 and developed SOB / DOE after transfusion.   Past Medical History  Stage IV Breast Cancer  Malignant Bilateral Pleural Effusions Chronic Hypoxic Respiratory Failure  Significant Hospital Events   6/18 Blood transfusion with SOB, DOE after  6/19 Admit   Consults:  PCCM 6/21  Procedures:  6/20 Left-sided thoracentesis for 400 cc of dark red fluid  Significant Diagnostic Tests:  6/02 Right Pleural Fluid Cytology >> positive for malignant cells c/w breast cancer  6/20 Left Thoracentesis >> 400 ml dark red pleural fluid removed  6/22: lower EXT duplex>>negative for DVT  Micro Data:  COVID 6/19 >> negative   Antimicrobials:  Cefepime 6/21>>6/24 Zosyn 6/22-6/24 Augmentin 6/24 (for 3 days)  Interim history/subjective:  Pt just vomited /p taking large drink of water, no nausea.  States she "just wants to go home." No pain or SOB.  SpO2 99% on nasal cannula.  Objective   Blood pressure (!) 136/54, pulse (!) 106, temperature 98.2 F (36.8 C), temperature source Oral, resp. rate (!) 22, height 5\' 2"  (1.575 m), weight 95.3 kg, last menstrual period 05/28/2013, SpO2 99 %.    FiO2 (%):  [35 %] 35 %   Intake/Output Summary (Last 24 hours) at 11/20/2018 0839 Last data filed at 11/20/2018 0529 Gross per 24 hour  Intake 519.86 ml  Output 1225 ml  Net -705.14 ml   Filed Weights   11/17/18 0421 11/19/18 0500 11/20/18 0323  Weight: 97.5 kg 95.3 kg 95.3 kg    Examination: General: anxious, chronically ill appearing, obese HENT:Normocephalic, PERRL, dry MM Neck: No JVD, Trachea midline  Lungs: BBS diminished bases, mild post rhonchi, tachypneic, non-labored  CV:RRR S1 S2 HUD:JSHFW, hyperactive BS x4. SNT/ND YO:VZCHYIFO in place EXT:MAE well. No edema  Skin:multiple plaques over lower EXT Neuro: A&Ox3, no focal deficits  Resolved Hospital Problem list      Assessment & Plan:    Acute on chronic hypoxic respiratory failure Doing well with BiPAP HS, prn and nasal cannula during the day P: -continue to titrate FiO2 keep saturations greater than 92% -BiPAP as needed for increased work of breathing -bronchial hygiene -nebs   Loculated pleural effusion Malignant pleural effusion P: -Status post thoracentesis -May require Pleurx catheter at some point as she has had multiple thoracenteses in the past   Stage IV breast cancer with malignant effusion P: -Oncology following -Palliative care consulted -need clear goals of care   Bilateral lower extremity edema P: -10/27/18 echo EF 60-65%  -continue cautious diuresis   Concern for aspiration pneumonia-has been on Zosyn -Augmentin for 3 days-stop date 6/26  Hypokalemia P: -replaced PO by TRH   Hypocalcemia P: -TRH managing   Best practice:  Diet: Per primary  DVT prophylaxis: heparin  Mobility: As tolerated  Code Status: Full Code  Family Communication: will update Disposition: SDU, per [facility]  Labs   CBC: Recent Labs  Lab 11/16/18 0502 11/17/18 0215 11/18/18 0955 11/19/18 0231 11/20/18 0223  WBC 9.3 7.7 7.8 8.0 9.1  NEUTROABS 6.6 5.4 5.2 5.1 5.5  HGB 9.0* 8.5* 9.0* 8.1* 8.0*  HCT 28.9* 27.3* 27.7* 26.5* 26.7*  MCV 100.0 102.6* 99.6 103.9* 104.3*  PLT PLATELET CLUMPS NOTED ON SMEAR, UNABLE TO ESTIMATE 101* 99* 96* 97*    Basic Metabolic Panel: Recent Labs  Lab 11/16/18 0502 11/17/18 0215 11/18/18 0955 11/19/18 0231 11/19/18 1511 11/20/18 0223  NA 135 137 140 137 140 139  K 3.7 2.8* 3.4* 2.6* 3.0* 3.1*  CL 94* 93* 96* 92* 92* 93*  CO2 29 30 27 31  36* 33*  GLUCOSE 119* 117* 115* 114* 208* 156*  BUN 10 15 14 13 12 12   CREATININE 0.66 0.66 0.68 0.71  0.65 0.58  CALCIUM 5.8* 5.2* 5.4* 5.1* 5.2* 5.1*  MG 1.9 2.2 2.0 1.9  --  2.0  PHOS 2.4* 2.3* 1.8* 1.8*  --  2.5   GFR: Estimated Creatinine Clearance: 81.5 mL/min (by C-G formula based on SCr of 0.58 mg/dL). Recent Labs  Lab 11/16/18 0910 11/16/18 1158 11/17/18 0215 11/18/18 0212 11/18/18 0955 11/19/18 0231 11/20/18 0223  PROCALCITON 1.02  --  2.30 1.69  --   --   --   WBC  --   --  7.7  --  7.8 8.0 9.1  LATICACIDVEN 1.5 1.3  --   --   --   --   --     Liver Function Tests: Recent Labs  Lab 11/15/18 0543 11/16/18 0502 11/17/18 0215 11/18/18 0955 11/19/18 0231  AST 36 31 22 22 22   ALT 22 18 16 15 14   ALKPHOS 295* 268* 208* 195* 194*  BILITOT 0.5 0.8 0.9 0.8 0.8  PROT 6.3* 5.9* 6.2* 6.3* 5.7*  ALBUMIN 2.1* 1.9* 2.3* 2.6* 2.3*    ABG    Component Value Date/Time   PHART 7.496 (H) 11/16/2018 2330   PCO2ART 44.1 11/16/2018 2330   PO2ART 82.5 (L) 11/16/2018 2330   HCO3 33.3 (H) 11/16/2018 2330   O2SAT 96.4 11/16/2018 2330     Coagulation Profile: NA   HbA1C: Hgb A1c MFr Bld  Date/Time Value Ref Range Status  10/27/2018 08:59 AM 5.3 4.8 - 5.6 % Final    Comment:    (NOTE) Pre diabetes:          5.7%-6.4% Diabetes:              >6.4% Glycemic control for   <7.0% adults with diabetes     PCCM will sign off. Thank you for the opportunity to participate in this patient's care. Please contact if we can be of further assistance.   Francine Graven, MSN, AGACNP  [facility]

## 2018-11-21 ENCOUNTER — Inpatient Hospital Stay: Payer: Medicaid Other

## 2018-11-21 LAB — CBC
HCT: 28.8 % — ABNORMAL LOW (ref 36.0–46.0)
Hemoglobin: 8.6 g/dL — ABNORMAL LOW (ref 12.0–15.0)
MCH: 31.7 pg (ref 26.0–34.0)
MCHC: 29.9 g/dL — ABNORMAL LOW (ref 30.0–36.0)
MCV: 106.3 fL — ABNORMAL HIGH (ref 80.0–100.0)
Platelets: 97 10*3/uL — ABNORMAL LOW (ref 150–400)
RBC: 2.71 MIL/uL — ABNORMAL LOW (ref 3.87–5.11)
RDW: 18.2 % — ABNORMAL HIGH (ref 11.5–15.5)
WBC: 8.9 10*3/uL (ref 4.0–10.5)
nRBC: 0.7 % — ABNORMAL HIGH (ref 0.0–0.2)

## 2018-11-21 LAB — BASIC METABOLIC PANEL
Anion gap: 12 (ref 5–15)
BUN: 13 mg/dL (ref 6–20)
CO2: 35 mmol/L — ABNORMAL HIGH (ref 22–32)
Calcium: 5.8 mg/dL — CL (ref 8.9–10.3)
Chloride: 94 mmol/L — ABNORMAL LOW (ref 98–111)
Creatinine, Ser: 0.6 mg/dL (ref 0.44–1.00)
GFR calc Af Amer: >60 mL/min (ref >60)
GFR calc non Af Amer: >60 mL/min (ref >60)
Glucose, Bld: 131 mg/dL — ABNORMAL HIGH (ref 70–99)
Potassium: 3.4 mmol/L — ABNORMAL LOW (ref 3.5–5.1)
Sodium: 141 mmol/L (ref 135–145)

## 2018-11-21 LAB — MAGNESIUM: Magnesium: 1.9 mg/dL (ref 1.7–2.4)

## 2018-11-21 MED ORDER — POTASSIUM CHLORIDE CRYS ER 20 MEQ PO TBCR
40.0000 meq | EXTENDED_RELEASE_TABLET | Freq: Once | ORAL | Status: AC
  Administered 2018-11-21: 40 meq via ORAL
  Filled 2018-11-21: qty 2

## 2018-11-21 MED ORDER — MAGNESIUM SULFATE IN D5W 1-5 GM/100ML-% IV SOLN
1.0000 g | Freq: Once | INTRAVENOUS | Status: AC
  Administered 2018-11-21: 1 g via INTRAVENOUS
  Filled 2018-11-21: qty 100

## 2018-11-21 MED ORDER — CALCIUM GLUCONATE-NACL 1-0.675 GM/50ML-% IV SOLN
1.0000 g | INTRAVENOUS | Status: AC
  Administered 2018-11-21 (×3): 1000 mg via INTRAVENOUS
  Filled 2018-11-21 (×3): qty 50

## 2018-11-21 MED ORDER — POTASSIUM CHLORIDE CRYS ER 20 MEQ PO TBCR
40.0000 meq | EXTENDED_RELEASE_TABLET | Freq: Every day | ORAL | Status: DC
  Administered 2018-11-21 – 2018-11-26 (×6): 40 meq via ORAL
  Filled 2018-11-21 (×6): qty 2

## 2018-11-21 NOTE — Progress Notes (Signed)
Physical Therapy Treatment Patient Details Name: Suzanne [name] MRN: [id number] DOB: February 09, 1961 Today's Date: 11/21/2018    History of Present Illness 58 y.o. 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  Patient underwent blood transfusion 11/13/18 and subsequently had SOB, DOE and orthopnea assoicated with cough. Dx of recurrent L pleural effusion, new onset CHF.    PT Comments    Pt motivated to participate in PT this session, although anxious. Pt ambulated room distance this session, SpO2 89% and above on 4LO2. Pt with tachycardia and tachypnea with ambulation, but recovered well with rest. Pt very concerned about LUE swelling, warmth, and pain with mobility. PT took pt through gentle LUE ROM, including elbow flexion/extension and limited ROM shoulder flexion. RN notified of pt's LUE discomfort. PT to continue to follow acutely.    Follow Up Recommendations  Home health PT     Equipment Recommendations  None recommended by PT    Recommendations for Other Services       Precautions / Restrictions Precautions Precautions: Fall Type of Shoulder Precautions: possible right clavicle fx. Per MD noted stabilize RUE and refrain from ROM. Possible left side rib fracture Precaution Booklet Issued: No Restrictions Weight Bearing Restrictions: No    Mobility  Bed Mobility Overal bed mobility: Needs Assistance             General bed mobility comments: Pt up in chair upon PT arrival, requesting to stay in chair after PT session.  Transfers Overall transfer level: Needs assistance Equipment used: Rolling walker (2 wheeled) Transfers: Sit to/from Stand Sit to Stand: Min assist         General transfer comment: Min assist for steadying, increased time to rise.  Ambulation/Gait Ambulation/Gait assistance: Min  guard Gait Distance (Feet): 20 Feet Assistive device: Rolling walker (2 wheeled) Gait Pattern/deviations: Step-through pattern;Decreased stride length;Trunk flexed Gait velocity: Decr   General Gait Details: Min guard for safety, verbal cuing for upright posture, turning with RW. Pt with DOE 3/4 sats, 89-92% during ambulation on 4LO2 on [place]. Pt HR up to 131 bpm and RR up to 35 breaths/min.   Stairs             Wheelchair Mobility    Modified Rankin (Stroke Patients Only)       Balance Overall balance assessment: Needs assistance Sitting-balance support: Feet supported;Bilateral upper extremity supported Sitting balance-Leahy Scale: Good     Standing balance support: During functional activity;Bilateral upper extremity supported Standing balance-Leahy Scale: Fair                              Cognition Arousal/Alertness: Awake/alert Behavior During Therapy: WFL for tasks assessed/performed;Anxious Overall Cognitive Status: Within Functional Limits for tasks assessed                                 General Comments: Pt tearful during session when discussing her medical situation      Exercises General Exercises - Lower Extremity Ankle Circles/Pumps: AROM;Both;10 reps;Seated Long Arc Quad: AROM;Both;5 reps;Seated Shoulder Exercises Elbow Flexion: AAROM;Left;5 reps;Seated Elbow Extension: AAROM;Left;5 reps;Seated    General Comments        Pertinent Vitals/Pain Pain Assessment: Faces Faces Pain Scale: Hurts little more Pain Location: LUE and  shoulder Pain Descriptors / Indicators: Aching;Sore Pain Intervention(s): Monitored during session;Repositioned;Limited activity within patient's tolerance    Home Living                      Prior Function            PT Goals (current goals can now be found in the care plan section) Acute Rehab PT Goals Patient Stated Goal: to go home PT Goal Formulation: With patient Time For  Goal Achievement: 12/02/18 Potential to Achieve Goals: Good Progress towards PT goals: Progressing toward goals    Frequency    Min 3X/week      PT Plan Current plan remains appropriate    Co-evaluation              AM-PAC PT "6 Clicks" Mobility   Outcome Measure  Help needed turning from your back to your side while in a flat bed without using bedrails?: A Little Help needed moving from lying on your back to sitting on the side of a flat bed without using bedrails?: A Little Help needed moving to and from a bed to a chair (including a wheelchair)?: A Little Help needed standing up from a chair using your arms (e.g., wheelchair or bedside chair)?: A Little Help needed to walk in hospital room?: A Little Help needed climbing 3-5 steps with a railing? : A Lot 6 Click Score: 17    End of Session Equipment Utilized During Treatment: Gait belt;Oxygen(4LO2 via [place]) Activity Tolerance: Patient limited by fatigue Patient left: in chair;with call bell/phone within reach Nurse Communication: Mobility status PT Visit Diagnosis: Muscle weakness (generalized) (M62.81);Difficulty in walking, not elsewhere classified (R26.2)     Time: 4656-8127 PT Time Calculation (min) (ACUTE ONLY): 30 min  Charges:  $Gait Training: 8-22 mins $Therapeutic Exercise: 8-22 mins                     Suzanne [name], PT Acute Rehabilitation Services Pager [phone]  Office [phone]    Suzanne [name] Suzanne [name] 11/21/2018, 1:15 PM

## 2018-11-21 NOTE — Progress Notes (Signed)
Lab critical paged. Calcium 5.8, potassium 3.4.

## 2018-11-21 NOTE — Progress Notes (Signed)
Met with Suzanne [name] today; she is clinically better, still very weak. She understands if she goes home she will be back in the hospital within 48 hours. She is agreeable to placement.  She is tolerating anastrozole well and we plan to add palbociclib as outpatient.  Ongoing issues:  (1) medicaid application: can SW expedite? (2) advanced directives: can chaplain service have patient complete HCPOA and get it notarized? (3) placement: plan? status?  If prolonged hospitalization anticipated I will add palbociclib next week  Greatly appreciate your help to this complex resourceless patient  Will follow with you  GM

## 2018-11-21 NOTE — Progress Notes (Signed)
Pt adamant about removing bipap mask.  Mask removed by RN per pt request.  Pt currently on 2lnc.  Bipap remains in room on standby.

## 2018-11-21 NOTE — Progress Notes (Addendum)
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  Patient admitted underwent thoracentesis of the right and left lungs on 6/2 and 6/3 and scheduled for repeat 3 days later. Patient was admitted respiratory status worsened and patient placed on the BiPAP and transferred to the stepdown unit.  Patient also placed on IV diuretics.  Patient noted to spike a fever and as such also placed on IV antibiotics for presumed aspiration pneumonia.  Due to tenuous respiratory status critical care consulted. Patient underwent left-sided thoracentesis on 11/15/2018 with 400 cc of back red pleural fluid removed.  Cytology from thoracentesis of 10/28/2018+ for malignant cells consistent with breast cancer.  COVID-19 negative.  IV antibiotics narrowed down to oral Augmentin starting 11/19/2018.  Lower extremity Dopplers which were done were negative.  Patient now on intermittent BiPAP mainly at night and on 3 L nasal cannula in the daytime however still with use of accessory muscles of respiration with poor air movement.  Palliative care consulted for goals of care.  Oncology following as well.   Assessment & Plan:   Principal Problem:   Acute on chronic respiratory failure with hypoxia (HCC) Active Problems:   Shortness of breath   Hypokalemia   Anemia   Bone metastasis (HCC)   Pleural effusion, bilateral   Malignant neoplasm of overlapping sites of right breast in female, estrogen receptor positive ([place])   Morbid obesity with BMI of 40.0-44.9, adult (HCC)   Carcinoma of  breast, estrogen receptor positive, stage 4, right (HCC)   Hypoalbuminemia   Palliative care by specialist   Pleural effusion, malignant   Hypophosphatemia   Hypocalcemia   Aspiration pneumonia (HCC)   SOB (shortness of breath)  1 acute on chronic hypoxic respiratory failure Felt likely multifactorial secondary to malignant pleural effusions, metastatic stage IV breast cancer with malignant effusion, possible aspiration pneumonia, volume overload. Patient now requiring noninvasive BiPAP on and off and wearing it intermittently.  Patient was on BiPAP overnight however asked for to be discontinued.  Currently on 5 L nasal cannula.  Patient status post thoracentesis on the left 11/23/2018 with 400 cc of dark red pleural fluid removed.  Last chest x-ray done (6/24) was stable low volume chest with atelectasis and interstitial opacity.  CT chest done on 11/17/2018 with widespread metastatic disease to the bones redemonstrated with multiple nondisplaced minimally displaced bilateral rib fractures as well as new widely displaced fracture of the medial aspect of the right clavicle with probable adjacent intramuscular hemorrhage in the lower right sternocleidomastoid muscle and upper right pectoralis major muscle.  Probable tumor infiltration throughout the right breast as well as bilateral axillary and soft pectoral lymphadenopathy.  Unusual appearance of the lungs similar to prior study could suggest chronic systemic disease such as sarcoidosis or interstitial lung disease.  Possibility of lymphangitic spread of tumor in the lungs could be considered but not strongly favored.  Moderate left pleural effusion and small right pleural effusion.  Cholelithiasis.  Lower extremity Dopplers negative for DVT.  COVID-19 test negative.  Patient still with increased O2 requirements and shortness of breath with use  of accessory muscles of respiration over the past few days which is slowly improving with diuresis and  antibiotics.  On IV Lasix with a urine output of 3.1 L over the past 24 hours.  Patient is -7.263 L during this hospitalization.  Current weight of 204.15 pounds from 210 pounds from 233.69 pounds on admission.  Patient was empirically on IV antibiotics of cefepime and subsequently Zosyn and has been transitioned to Augmentin per PCCM.  Continue pulmonary hygiene.  Continue scheduled Xopenex and Atrovent nebs.  Continue Lasix 60 mg IV every 8 hours.  Strict I's and O's.  Daily weights.  PCCM following and appreciate their input and recommendations.  2.  Malignant pleural effusion/loculated pleural effusion/right-sided inflammatory stage IV breast cancer involving lungs, lymph nodes, bones Status post thoracentesis on the left with 400 cc of dark red pleural fluid removed.  Cytology from thoracentesis 10/28/2018+ for malignant cells consistent with breast cancer.  Patient may require Pleurx catheter if recurrence of effusion however not at this time per PCCM.  Patient with extensive progressive disease.  Oncology and palliative care following.  Continue Arimidex.  Oncology planning to add palbociclib as an outpatient however per oncology a prolonged hospitalization will probably start palbociclib next week.  Per oncology.  3.  Hypocalcemia Likely secondary to recent Xgeva injection given on 11/13/2018.  Increased oral calcium supplementation to 3 times daily.  Patient status post IV calcium gluconate on 11/18/2018.  Patient has been ordered IV calcium gluconate 1 g IV x3 doses this morning.  Keep magnesium greater than 2.  Follow.   4.  Hypokalemia Likely secondary to diuresis.  K. Dur 40 mEq p.o. daily.  Magnesium at 1.9.  Follow.   5.  Bilateral lower extremity rash on feet and legs Unclear etiology.  Patient with some mild petechia which is slowly improving.  May be secondary to transfusion obtained prior to admission.  Follow for now.  6.  Probable aspiration pneumonia Patient noted to be febrile  during this hospitalization.  Fever curve trended down.  MRSA PCR negative.  Patient was on IV cefepime subsequently transitioned to IV Zosyn which have been discontinued.  Patient transitioned to oral Augmentin for 3 more days per pulmonary.  7.  Hypophosphatemia Repleted.  8.  Microcytic anemia Patient received transfusion prior to admission.  Patient with no signs or symptoms of bleeding.  Hemoglobin at 8.6.  Follow.  Transfusion threshold hemoglobin less than 7.  Oncology following.  9.  Volume overload in the setting of blood transfusion versus suspected acute diastolic CHF Patient noted to be short of breath posttransfusion.  Patient given IV Lasix during the hospitalization and was on 40 IV every 12 hours down from 80 IV every 12 hours.  2D echo from 10/27/2018 with a EF of 60 to 17%, diastolic Doppler parameters indeterminate.  Patient with a urine output of 3.1 L over the past 24 hours.  Patient is -7.263 L during this hospitalization.  Lasix increased to 60 mg IV every 8 hours on 11/20/2018.  Patient's current weight is 4.15 pounds from 210.1 pounds from 233.69 pounds on admission.  Continue current dose of Lasix 60 mg IV every 8 hours.  Strict I's and O's.  Daily weights. Follow.  10.  Obesity  11.  Diarrhea Patient with loose stools which are improving after being given dose of Imodium on 11/20/2018.  Rectal pouch in place with decreased amount of loose stools.  Continue Imodium as needed.   DVT prophylaxis: Lovenox. Code Status:  Full Family Communication: Updated patient.  No family at bedside. Disposition Plan: Remain in stepdown unit today.  High risk for decompensation.  Will likely need skilled nursing facility on discharge as patient with significant weakness.    Consultants:   PCCM: Dr. Carson Myrtle 11/16/2018  Palliative care: Dr. Rowe Pavy 11/18/2018  Allergy: Dr. Jana Hakim 11/17/2018  Procedures:  6/02 Right Pleural Fluid Cytology >> positive for malignant cells c/w breast cancer    6/20 Left Thoracentesis >> 400 ml dark red pleural fluid removed  11/17/2018 lower extremity Dopplers Chest x-ray: 11/14/2018, 11/15/2018, 11/16/2018, 11/17/2018, 11/18/2018, 11/19/2018 CT chest 11/17/2018    Antimicrobials:  Cefepime 6/21  Zosyn 6/22-6/24 Augmentin 6/24 (for 3 days)>>>> 11/22/2018  Micro Data:  COVID 6/19 >> negative     Subjective: Patient laying in bed getting changed.  Patient complaining of left shoulder pain ongoing for several weeks.  Patient states some improvement with shortness of breath over the past 24 hours.  Patient refused BiPAP overnight.  Patient on 5 L nasal cannula at rest.  Some loose stools and rectal tube improving.  Patient denies any chest pain.  No abdominal pain.  Somewhat tearful.   Objective: Vitals:   11/21/18 0500 11/21/18 0600 11/21/18 0700 11/21/18 0800  BP: (!) 152/78 (!) 155/86 (!) 151/87 (!) 153/87  Pulse: (!) 108 (!) 106 (!) 109 (!) 104  Resp: (!) 35 (!) 31 (!) 24 (!) 26  Temp:    97.6 F (36.4 C)  TempSrc:    Oral  SpO2: 99% 100% 100% 100%  Weight:      Height:        Intake/Output Summary (Last 24 hours) at 11/21/2018 0943 Last data filed at 11/21/2018 0900 Gross per 24 hour  Intake 100 ml  Output 4350 ml  Net -4250 ml   Filed Weights   11/19/18 0500 11/20/18 0323 11/21/18 0439  Weight: 95.3 kg 95.3 kg 92.6 kg    Examination:  General exam: Less respiratory distress. Respiratory system: Poor air movement.  Decreased diffuse crackles with more crackles right greater than left.  Less use of accessory muscles of respiration.  Cardiovascular system: Tachycardia.Marland Kitchen+ JVD, murmurs, rubs, gallops or clicks.  1+ bilateral lower extremity edema.  Gastrointestinal system: Abdomen is nondistended, soft and nontender. No organomegaly or masses felt. Normal bowel sounds heard. Central nervous system: Alert and oriented. No focal neurological deficits. Extremities: 1+ bilateral lower extremity edema.  Symmetric 5 x 5 power. Skin:  Slowly improving petechial rash on bilateral feet and distal lower extremities. Psychiatry: Judgement and insight appear normal. Mood & affect appropriate.     Data Reviewed: I have personally reviewed following labs and imaging studies  CBC: Recent Labs  Lab 11/16/18 0502 11/17/18 0215 11/18/18 0955 11/19/18 0231 11/20/18 0223 11/21/18 0222  WBC 9.3 7.7 7.8 8.0 9.1 8.9  NEUTROABS 6.6 5.4 5.2 5.1 5.5  --   HGB 9.0* 8.5* 9.0* 8.1* 8.0* 8.6*  HCT 28.9* 27.3* 27.7* 26.5* 26.7* 28.8*  MCV 100.0 102.6* 99.6 103.9* 104.3* 106.3*  PLT PLATELET CLUMPS NOTED ON SMEAR, UNABLE TO ESTIMATE 101* 99* 96* 97* 97*   Basic Metabolic Panel: Recent Labs  Lab 11/16/18 0502 11/17/18 0215 11/18/18 0955 11/19/18 0231 11/19/18 1511 11/20/18 0223 11/21/18 0222  NA 135 137 140 137 140 139 141  K 3.7 2.8* 3.4* 2.6* 3.0* 3.1* 3.4*  CL 94* 93* 96* 92* 92* 93* 94*  CO2 29 30 27 31  36* 33* 35*  GLUCOSE 119* 117* 115* 114* 208* 156* 131*  BUN 10 15 14 13 12 12 13   CREATININE 0.66 0.66 0.68 0.71 0.65 0.58 0.60  CALCIUM 5.8* 5.2* 5.4* 5.1* 5.2* 5.1* 5.8*  MG 1.9 2.2 2.0 1.9  --  2.0 1.9  PHOS 2.4* 2.3* 1.8* 1.8*  --  2.5  --    GFR: Estimated Creatinine Clearance: 80.2 mL/min (by C-G formula based on SCr of 0.6 mg/dL). Liver Function Tests: Recent Labs  Lab 11/15/18 0543 11/16/18 0502 11/17/18 0215 11/18/18 0955 11/19/18 0231  AST 36 31 22 22 22   ALT 22 18 16 15 14   ALKPHOS 295* 268* 208* 195* 194*  BILITOT 0.5 0.8 0.9 0.8 0.8  PROT 6.3* 5.9* 6.2* 6.3* 5.7*  ALBUMIN 2.1* 1.9* 2.3* 2.6* 2.3*   No results for input(s): LIPASE, AMYLASE in the last 168 hours. No results for input(s): AMMONIA in the last 168 hours. Coagulation Profile: Recent Labs  Lab 11/15/18 0543  INR 1.2   Cardiac Enzymes: No results for input(s): CKTOTAL, CKMB, CKMBINDEX, TROPONINI in the last 168 hours. BNP (last 3 results) No results for input(s): PROBNP in the last 8760 hours. HbA1C: No results for  input(s): HGBA1C in the last 72 hours. CBG: Recent Labs  Lab 11/20/18 2121  GLUCAP 140*   Lipid Profile: No results for input(s): CHOL, HDL, LDLCALC, TRIG, CHOLHDL, LDLDIRECT in the last 72 hours. Thyroid Function Tests: No results for input(s): TSH, T4TOTAL, FREET4, T3FREE, THYROIDAB in the last 72 hours. Anemia Panel: No results for input(s): VITAMINB12, FOLATE, FERRITIN, TIBC, IRON, RETICCTPCT in the last 72 hours. Sepsis Labs: Recent Labs  Lab 11/16/18 0910 11/16/18 1158 11/17/18 0215 11/18/18 0212  PROCALCITON 1.02  --  2.30 1.69  LATICACIDVEN 1.5 1.3  --   --     Recent Results (from the past 240 hour(s))  SARS Coronavirus 2 (CEPHEID - Performed in [place] hospital lab), Hosp Order     Status: None   Collection Time: 11/14/18  4:34 PM   Specimen: Nasopharyngeal Swab  Result Value Ref Range Status   SARS Coronavirus 2 NEGATIVE NEGATIVE Final    Comment: (NOTE) If result is NEGATIVE SARS-CoV-2 target nucleic acids are NOT DETECTED. The SARS-CoV-2 RNA is generally detectable in upper and lower  respiratory specimens during the acute phase of infection. The lowest  concentration of SARS-CoV-2 viral copies this assay can detect is 250  copies / mL. A negative result does not preclude SARS-CoV-2 infection  and should not be used as the sole basis for treatment or other  patient management decisions.  A negative result may occur with  improper specimen collection / handling, submission of specimen other  than nasopharyngeal swab, presence of viral mutation(s) within the  areas targeted by this assay, and inadequate number of viral copies  (<250 copies / mL). A negative result must be combined with clinical  observations, patient history, and epidemiological information. If result is POSITIVE SARS-CoV-2 target nucleic acids are DETECTED. The SARS-CoV-2 RNA is generally detectable in upper and lower  respiratory specimens dur ing the acute phase of infection.   Positive  results are indicative of active infection with SARS-CoV-2.  Clinical  correlation with patient history and other diagnostic information is  necessary to determine patient infection status.  Positive results do  not rule out bacterial infection or co-infection with other viruses. If result is PRESUMPTIVE POSTIVE SARS-CoV-2 nucleic acids MAY BE PRESENT.   A presumptive positive result was obtained on the submitted specimen  and confirmed on repeat testing.  While  2019 novel coronavirus  (SARS-CoV-2) nucleic acids may be present in the submitted sample  additional confirmatory testing may be necessary for epidemiological  and / or clinical management purposes  to differentiate between  SARS-CoV-2 and other Sarbecovirus currently known to infect humans.  If clinically indicated additional testing with an alternate test  methodology [id number]) is advised. The SARS-CoV-2 RNA is generally  detectable in upper and lower respiratory sp ecimens during the acute  phase of infection. The expected result is Negative. Fact Sheet for Patients:  StrictlyIdeas.no Fact Sheet for Healthcare Providers: BankingDealers.co.za This test is not yet approved or cleared by the [place] FDA and has been authorized for detection and/or diagnosis of SARS-CoV-2 by FDA under an Emergency Use Authorization (EUA).  This EUA will remain in effect (meaning this test can be used) for the duration of the COVID-19 declaration under Section 564(b)(1) of the Act, 21 U.S.C. section 360bbb-3(b)(1), unless the authorization is terminated or revoked sooner. Performed at [facility], [place] [address], [place], [place] [address]   MRSA PCR Screening     Status: None   Collection Time: 11/16/18  8:02 AM   Specimen: Nasal Mucosa; Nasopharyngeal  Result Value Ref Range Status   MRSA by PCR NEGATIVE NEGATIVE Final    Comment:        The GeneXpert MRSA  Assay (FDA approved for NASAL specimens only), is one component of a comprehensive MRSA colonization surveillance program. It is not intended to diagnose MRSA infection nor to guide or monitor treatment for MRSA infections. Performed at [facility], [place] [address], [place], [place] [address]          Radiology Studies: Dg Chest Port 1 View  Result Date: 11/20/2018 CLINICAL DATA:  Hypoxia, metastatic breast cancer EXAM: PORTABLE CHEST 1 VIEW COMPARISON:  Portable exam 1200 hours compared to 11/19/2018 FINDINGS: Very low lung volumes. Stable heart size mediastinal contours. BILATERAL pulmonary opacities again identified, unchanged. No new infiltrate, pleural effusion or pneumothorax. Scattered areas of osseous sclerosis consistent with osseous metastatic disease IMPRESSION: Widespread osseous metastatic disease. Persistent pulmonary opacities. Electronically Signed   By: Lavonia Dana M.D.   On: 11/20/2018 14:06        Scheduled Meds:  amoxicillin-clavulanate  1 tablet Oral Q12H   anastrozole  1 mg Oral Daily   calcium-vitamin D  1 tablet Oral TID   Chlorhexidine Gluconate Cloth  6 each Topical Daily   enoxaparin (LOVENOX) injection  30 mg Subcutaneous Daily   feeding supplement (ENSURE ENLIVE)  237 mL Oral BID BM   furosemide  60 mg Intravenous Q8H   ipratropium  0.5 mg Nebulization TID   levalbuterol  0.63 mg Nebulization TID   mouth rinse  15 mL Mouth Rinse q12n4p   potassium chloride  40 mEq Oral Daily   Continuous Infusions:  sodium chloride Stopped (11/16/18 1407)     LOS: 7 days    Time spent: 45 minutes    Irine Seal, MD [facility]  If 7PM-7AM, please contact night-coverage [email or web address] 11/21/2018, 9:43 AM

## 2018-11-21 NOTE — Progress Notes (Signed)
Pt seen, awake, watching tv on 4lnc.  [id number], [id number], spo2 95%.  Pt states the bipap scares her and does not want to go back on it at this time.  This writer attempted to reassure her and educate her on the benefits of bipap but pt still refused.  RT will continue to monitor and assist with bipap as needed.

## 2018-11-21 NOTE — Progress Notes (Signed)
HEMATOLOGY-ONCOLOGY PROGRESS NOTE  SUBJECTIVE: Thinks breathing is better.  Using BiPAP only intermittently.  States that she plans to get up to the recliner chair today.  Lower extremity edema is about the same.  REVIEW OF SYSTEMS:   Constitutional: Denies fevers, chills Eyes: Denies blurriness of vision Ears, nose, mouth, throat, and face: Denies mucositis or sore throat Respiratory: Reports shortness of breath, somewhat improved Cardiovascular: Denies palpitation, chest discomfort Gastrointestinal:  Denies nausea, heartburn or change in bowel habits Skin: Has rash to bilateral feet.  Lymphatics: Denies new lymphadenopathy or easy bruising Neurological:Denies numbness, tingling or new weaknesses Behavioral/Psych: Reports being tearful.   Extremities: Has swelling and discoloration in her LE. All other systems were reviewed with the patient and are negative.  I have reviewed the past medical history, past surgical history, social history and family history with the patient and they are unchanged from previous note.   PHYSICAL EXAMINATION:  Vitals:   11/21/18 0800 11/21/18 1005  BP: (!) 153/87   Pulse: (!) 104   Resp: (!) 26   Temp: 97.6 F (36.4 C)   SpO2: 100% 100%   Filed Weights   11/19/18 0500 11/20/18 0323 11/21/18 0439  Weight: 210 lb 1.6 oz (95.3 kg) 210 lb 1.6 oz (95.3 kg) 204 lb 2.3 oz (92.6 kg)    Intake/Output from previous day: 06/25 0701 - 06/26 0700 In: 100 [IV Piggyback:100] Out: 3100 [Urine:3100]  GENERAL:alert, distress SKIN: Discoloration of her bilateral feet; improving EYES: normal, Conjunctiva are pink and non-injected, sclera clear OROPHARYNX:no exudate, no erythema and lips, buccal mucosa, and tongue normal  NECK: supple, thyroid normal size, non-tender, without nodularity LYMPH:  no palpable lymphadenopathy in the cervical, axillary or inguinal LUNGS: Tachypneic.  Diffuse crackles. HEART: regular rate & rhythm and no murmurs.  1+ bilateral  lower extremity edema. ABDOMEN:abdomen soft, non-tender and normal bowel sounds Musculoskeletal:no cyanosis of digits and no clubbing  NEURO: alert & oriented x 3 with fluent speech, no focal motor/sensory deficits. Anxious and tearful at times.  LABORATORY DATA:  I have reviewed the data as listed CMP Latest Ref Rng & Units 11/21/2018 11/20/2018 11/19/2018  Glucose 70 - 99 mg/dL 131(H) 156(H) 208(H)  BUN 6 - 20 mg/dL _0 Creatinine 0.44 - 1.00 mg/dL 0.60 0.58 0.65  Sodium 135 - 145 mmol/L 141 139 140  Potassium 3.5 - 5.1 mmol/L 3.4(L) 3.1(L) 3.0(L)  Chloride 98 - 111 mmol/L 94(L) 93(L) 92(L)  CO2 22 - 32 mmol/L 35(H) 33(H) 36(H)  Calcium 8.9 - 10.3 mg/dL 5.8(LL) 5.1(LL) 5.2(LL)  Total Protein 6.5 - 8.1 g/dL - - -  Total Bilirubin 0.3 - 1.2 mg/dL - - -  Alkaline Phos 38 - 126 U/L - - -  AST 15 - 41 U/L - - -  ALT 0 - 44 U/L - - -    Lab Results  Component Value Date   WBC 8.9 11/21/2018   HGB 8.6 (L) 11/21/2018   HCT 28.8 (L) 11/21/2018   MCV 106.3 (H) 11/21/2018   PLT 97 (L) 11/21/2018   NEUTROABS 5.5 11/20/2018    Dg Chest 1 View  Result Date: 11/15/2018 CLINICAL DATA:  Post left-sided thoracentesis. EXAM: CHEST  1 VIEW COMPARISON:  11/14/2018 FINDINGS: Lungs are hypoinflated demonstrate interval improvement with moderate residual left-sided pleural effusion likely with associated basilar atelectasis. No pneumothorax. Mild prominence of the perihilar markings without significant change. Cardiomediastinal silhouette and remainder of the exam is unchanged. IMPRESSION: Hypoinflation with interval improvement in left pleural  effusion post thoracentesis. No pneumothorax. Moderate residual left effusion likely with associated basilar atelectasis. Prominence of the perihilar markings suggesting vascular congestion. Electronically Signed   By: Marin Olp M.D.   On: 11/15/2018 13:58   Dg Chest 1 View  Result Date: 10/29/2018 CLINICAL DATA:  Post LEFT thoracentesis EXAM: CHEST  1  VIEW COMPARISON:  622 in 20 FINDINGS: Upper normal heart size. Stable mediastinal contours. BILATERAL upper lobe scarring. Improved aeration at bases since prior study. Minimal residual LEFT pleural effusion without pneumothorax. Bones demineralized. IMPRESSION: Extensive chronic lung changes. No pneumothorax following LEFT thoracentesis. Electronically Signed   By: Lavonia Dana M.D.   On: 10/29/2018 15:50   Dg Chest 1 View  Result Date: 10/28/2018 CLINICAL DATA:  Post right thoracentesis EXAM: CHEST  1 VIEW COMPARISON:  10/28/2018 FINDINGS: Very low lung volumes again noted. Interstitial prominence within the lungs. Heart is borderline in size with vascular congestion. No pneumothorax following right thoracentesis. Diffuse skeletal metastases again noted. IMPRESSION: Very low lung volumes with cardiomegaly, vascular congestion and interstitial prominence. No pneumothorax following thoracentesis. Electronically Signed   By: Rolm Baptise M.D.   On: 10/28/2018 16:00   Dg Chest 2 View  Result Date: 11/14/2018 CLINICAL DATA:  Stage IV breast cancer.  Shortness of breath. EXAM: CHEST - 2 VIEW COMPARISON:  Radiographs 11/13/2018 and 10/29/2018.  CT 10/26/2018. FINDINGS: The heart size and mediastinal contours are stable. Left pleural effusion and left basilar opacity are unchanged from the recent study. Diffusely increased interstitial markings are again noted throughout both lungs, remaining suspicious for lymphangitic spread of tumor based on previous CT. No progressive airspace opacities identified. Diffuse osseous metastatic disease noted without evidence of acute fracture. IMPRESSION: Stable radiographic appearance of the chest since yesterday. A left pleural effusion and left basilar pulmonary opacity have increased from older prior studies. Evidence of extensive metastatic disease again noted. Electronically Signed   By: Richardean Sale M.D.   On: 11/14/2018 17:10   Dg Chest 2 View  Result Date:  11/13/2018 CLINICAL DATA:  Shortness of breath, weakness. Cancer patient. EXAM: CHEST - 2 VIEW COMPARISON:  Chest x-ray dated 10/29/2018. Chest CT dated 10/26/2018. FINDINGS: Heart size and mediastinal contours appear stable. Increased opacity at the LEFT lung base, likely increased pleural effusion. There is also central pulmonary vascular congestion and bilateral perihilar edema. No pneumothorax seen. Diffuse osseous metastases, as described on previous CT report. IMPRESSION: 1. Increased opacity at the LEFT lung base, likely increased pleural effusion. Superimposed pneumonia or airspace collapse not excluded. 2. Central pulmonary vascular congestion and bilateral perihilar edema indicating CHF/volume overload. 3. Diffuse osseous metastases. Electronically Signed   By: Franki Cabot M.D.   On: 11/13/2018 12:32   Ct Head W & Wo Contrast  Result Date: 10/30/2018 CLINICAL DATA:  Metastatic breast cancer. No neurologic symptoms are reported. EXAM: CT HEAD WITHOUT AND WITH CONTRAST TECHNIQUE: Contiguous axial images were obtained from the base of the skull through the vertex without and with intravenous contrast CONTRAST:  91m ISOVUE-300 IOPAMIDOL (ISOVUE-300) INJECTION 61% COMPARISON:  Nuclear medicine whole-body bone scan, 10/27/2018 FINDINGS: Brain: No acute stroke, hemorrhage, mass lesion or hydrocephalus. Mild atrophy. No extra-axial fluid. Post infusion, no abnormal enhancement of the brain or visible meninges. Vascular: No hyperdense vessel or unexpected calcification. Visible vessels are patent. Skull: Widespread metastatic disease to the calvarium, skull base, and visible cervical spine. Metastatic disease to the mandible is also present. There is destruction of the inner and outer table of the skull  in multiple locations. Epidural tumor is not established. Sinuses/Orbits: No acute finding. Other: None. IMPRESSION: 1. Widespread osseous metastatic disease to the calvarium, skull base, and visible cervical  spine. Epidural tumor not established. 2. No intracranial metastatic disease is evident. Electronically Signed   By: Staci Righter M.D.   On: 10/30/2018 09:50   Ct Chest Wo Contrast  Result Date: 11/17/2018 CLINICAL DATA:  58 year old female Caucasian female nonsmoker with history of bilateral lower extremity edema. Possible stage IV breast cancer. EXAM: CT CHEST WITHOUT CONTRAST TECHNIQUE: Multidetector CT imaging of the chest was performed following the standard protocol without IV contrast. COMPARISON:  Chest CT 10/26/2018. FINDINGS: Cardiovascular: Heart size is normal. There is no significant pericardial fluid, thickening or pericardial calcification. There is aortic atherosclerosis, as well as atherosclerosis of the great vessels of the mediastinum and the coronary arteries, including calcified atherosclerotic plaque in the left anterior descending and right coronary arteries. Mediastinum/Nodes: No pathologically enlarged mediastinal or hilar lymph nodes. Please note that accurate exclusion of hilar adenopathy is limited on noncontrast CT scans. Esophagus is unremarkable in appearance. Multiple borderline enlarged and mildly enlarged bilateral axillary and subpectoral lymph nodes (right greater than left) measuring up to 15 mm in short axis on the right (axial image 49 of series 2). Lungs/Pleura: Extensive septal thickening with band like opacities throughout both lungs, similar to the prior study. No discrete pulmonary nodules or masses are confidently identified. Moderate left and small right pleural effusions. The left pleural effusion is predominantly sub pulmonic in position. Upper Abdomen: Several peripherally calcified gallstones lying dependently in the gallbladder measuring up to 1.9 cm in diameter. Aortic atherosclerosis. Musculoskeletal: Right breast is incompletely imaged, but there appears to be irregular soft tissue infiltration throughout much of the right breast tissue, which could reflect  tumoral in for tray shin and/or edema. Diffuse lytic and sclerotic lesions throughout the visualized axial and appendicular skeleton, indicative of widespread metastatic disease to the bones. With appears very similar to the prior examination. There continue to be multiple nondisplaced to minimally displaced pathologic fractures throughout the ribs bilaterally. In addition, there is a new widely displaced fracture of the medial aspect of the right clavicle with approximately 1.4 cm of lateral and anterior displacement, as well as high attenuation thickening of the adjacent musculature in the upper right pectoralis major and lower right sternocleidomastoid muscles, most compatible with intramuscular hematoma and/or tumor. IMPRESSION: 1. Widespread metastatic disease to the bones redemonstrated, with multiple nondisplaced minimally displaced bilateral rib fractures, as well as a new widely displaced fracture of the medial aspect of the right clavicle with probable adjacent intramuscular hemorrhage in the lower right sternocleidomastoid muscle and upper right pectoralis major muscle. Probable tumoral infiltration throughout the right breast, as well as bilateral axillary and subpectoral lymphadenopathy (right greater than left). 2. Unusual appearance of the lungs, similar to the prior study, which could suggest chronic systemic disease such as sarcoidosis or interstitial lung disease. The possibility of lymphangitic spread of tumor in the lungs could be considered, but is not strongly favored at this time. 3. Moderate left (predominantly subpulmonic) pleural effusion and small right pleural effusion. 4. Cholelithiasis. 5. Aortic atherosclerosis, in addition to 2 vessel coronary artery disease. Aortic Atherosclerosis (ICD10-I70.0). Electronically Signed   By: Vinnie Langton M.D.   On: 11/17/2018 14:55   Ct Chest W Contrast  Result Date: 10/26/2018 CLINICAL DATA:  Shortness of breath, possible metastatic disease.  Breast cancer. EXAM: CT CHEST WITH CONTRAST TECHNIQUE: Multidetector CT imaging of  the chest was performed during intravenous contrast administration. CONTRAST:  76m OMNIPAQUE IOHEXOL 300 MG/ML  SOLN COMPARISON:  Chest x-ray earlier today FINDINGS: Cardiovascular: Mild cardiomegaly. Moderate coronary artery calcifications in the left anterior descending coronary artery. Aorta is normal caliber. Scattered atherosclerotic change. Mediastinum/Nodes: Numerous borderline sized and mildly enlarged mediastinal lymph nodes. Right paratracheal node measures 12 mm. Borderline size bilateral hilar lymph nodes, the largest on the right measuring 11 mm in short axis diameter. Numerous enlarged bilateral axillary lymph nodes with index left axillary lymph node having a short axis diameter of 2.1 cm on image 21 and right axillary lymph node having a short axis diameter of 1.4 cm on image 48. Lungs/Pleura: Moderate bilateral pleural effusions. Extensive interstitial disease throughout the lungs bilaterally. Mild reticulonodular densities. Cannot exclude lymphangitic spread of tumor. Upper Abdomen: Gallstones noted within the gallbladder. Musculoskeletal: Chest wall soft tissues are unremarkable. Extensive diffuse mixed lytic and sclerotic lesions throughout the visualized skeleton compatible with diffuse skeletal metastases. IMPRESSION: Bilateral axillary and mediastinal adenopathy. Borderline hilar lymph nodes bilaterally. Findings likely reflect metastatic lymphadenopathy. Moderate bilateral pleural effusions. Extensive interstitial thickening and reticulonodular opacities throughout the lungs. Cannot exclude lymphangitic spread of tumor. Diffuse skeletal metastases. Cholelithiasis. Coronary artery disease. Aortic Atherosclerosis (ICD10-I70.0). Electronically Signed   By: KRolm BaptiseM.D.   On: 10/26/2018 22:54   Nm Bone Scan Whole Body  Result Date: 10/27/2018 CLINICAL DATA:  Breast cancer staging. EXAM: NUCLEAR MEDICINE  WHOLE BODY BONE SCAN TECHNIQUE: Whole body anterior and posterior images were obtained approximately 3 hours after intravenous injection of radiopharmaceutical. RADIOPHARMACEUTICALS:  20.6 mCi Technetium-94mDP IV COMPARISON:  CT scan Oct 26, 2018 FINDINGS: Bony metastatic disease is seen in both of the femurs, numerous ribs, the manubrium, and right humerus. The widespread bony metastatic disease in the scapula, thoracic spine, sternum, and proximal humeri is better appreciated on the comparison CT scan. IMPRESSION: Widespread bony metastatic disease. The extent of metastatic disease is actually better appreciated on the recent CT imaging. This study is a near super scan consistent with widespread bony metastatic disease. Electronically Signed   By: DaDorise BullionII M.D   On: 10/27/2018 16:01   Dg Chest Port 1 View  Result Date: 11/20/2018 CLINICAL DATA:  Hypoxia, metastatic breast cancer EXAM: PORTABLE CHEST 1 VIEW COMPARISON:  Portable exam 1200 hours compared to 11/19/2018 FINDINGS: Very low lung volumes. Stable heart size mediastinal contours. BILATERAL pulmonary opacities again identified, unchanged. No new infiltrate, pleural effusion or pneumothorax. Scattered areas of osseous sclerosis consistent with osseous metastatic disease IMPRESSION: Widespread osseous metastatic disease. Persistent pulmonary opacities. Electronically Signed   By: MaLavonia Dana.D.   On: 11/20/2018 14:06   Dg Chest Port 1 View  Result Date: 11/19/2018 CLINICAL DATA:  Shortness of breath EXAM: PORTABLE CHEST 1 VIEW COMPARISON:  Yesterday FINDINGS: Very low volume chest with patchy bilateral infiltrate greater on the right. Some of these are streaky and suggestive of scarring or atelectasis. No visible effusion or pneumothorax. Largely obscured heart which is not enlarged by recent CT. Extensive osseous metastatic disease IMPRESSION: Stable low volume chest with atelectasis and interstitial opacity, reference chest CT from 2  days ago. Electronically Signed   By: JoMonte Fantasia.D.   On: 11/19/2018 08:54   Dg Chest Port 1 View  Result Date: 11/18/2018 CLINICAL DATA:  Shortness of breath and increased weakness this morning; history metastatic breast cancer EXAM: PORTABLE CHEST 1 VIEW COMPARISON:  Portable exam 0823 hours compared to 11/17/2018 FINDINGS: Very  low lung volumes. Normal heart size mediastinal contours. Patchy opacities in the lungs bilaterally favoring pneumonia over atelectasis. No pleural effusion or pneumothorax. Patchy osteosclerosis consistent with osseous metastatic disease. IMPRESSION: BILATERAL pulmonary opacities likely representing multifocal pneumonia. Electronically Signed   By: Lavonia Dana M.D.   On: 11/18/2018 09:30   Dg Chest Port 1 View  Result Date: 11/17/2018 CLINICAL DATA:  Shortness of breath.  Metastatic breast cancer. EXAM: PORTABLE CHEST 1 VIEW COMPARISON:  11/16/2018.  CT 10/26/2018. FINDINGS: Mediastinum stable. Heart size normal. Persistent left base atelectasis infiltrate. Right base atelectasis/infiltrate also noted on today's exam. Diffuse bilateral interstitial prominence again noted. Tiny left pleural effusion again noted. No pneumothorax. Multiple lytic lesions noted throughout the visualized bony structures. IMPRESSION: 1. Persistent left base atelectasis/infiltrate. Right base atelectasis/infiltrate also noted on today's exam. Small left pleural effusion again noted. 2. Diffuse bilateral interstitial prominence again noted consistent with interstitial tumor spread this patient with known metastatic disease. 3. Multiple lytic lesions are again noted throughout the visualized bony structures consistent with metastatic disease. Electronically Signed   By: Marcello Moores  Register   On: 11/17/2018 06:24   Dg Chest Port 1 View  Result Date: 11/16/2018 CLINICAL DATA:  Dyspnea, metastatic breast cancer EXAM: PORTABLE CHEST 1 VIEW COMPARISON:  Chest radiograph from one day prior. FINDINGS: Low  lung volumes. Stable cardiomediastinal silhouette with mild cardiomegaly. No pneumothorax. Stable small bilateral pleural effusions. Stable patchy left lung base consolidation. Stable thick irregular reticular opacities in the upper lungs. IMPRESSION: 1. Stable low lung volumes with small bilateral pleural effusions. 2. Stable thick irregular reticular opacities in the upper lungs and patchy left lung base consolidation. Findings likely representing combination of lymphangitic tumor (as seen on recent chest CT) and atelectasis. A component of mild pulmonary edema cannot be excluded. Electronically Signed   By: Ilona Sorrel M.D.   On: 11/16/2018 07:13   Dg Chest Port 1 View  Result Date: 10/28/2018 CLINICAL DATA:  Bone injury EXAM: PORTABLE CHEST 1 VIEW COMPARISON:  10/26/2018 FINDINGS: Diffuse widespread metastatic disease is again noted involving the osseous structures. There is an acute appearing fracture involving the fourth rib posterolaterally on the left. There are likely additional left-sided rib fractures that are suboptimally evaluated on this exam. The lung volumes are low. The cardiac silhouette is enlarged. There are growing bilateral pleural effusions and prominent bilateral interstitial lung markings. Bibasilar airspace opacities are noted. IMPRESSION: 1. Acute left-sided rib fracture involving the fourth rib posterolaterally. No pneumothorax. There are likely additional adjacent fractures that are suboptimally evaluated on this exam. 2. Low lung volumes. 3. Cardiomegaly with growing bilateral pleural effusions and prominent interstitial lung markings suggestive of progressive pulmonary edema. Electronically Signed   By: Constance Holster M.D.   On: 10/28/2018 01:55   Dg Chest Portable 1 View  Result Date: 10/26/2018 CLINICAL DATA:  Shortness of breath, possible right breast cancer EXAM: PORTABLE CHEST 1 VIEW COMPARISON:  None. FINDINGS: Low lung volumes. No pleural effusion. Diffuse  interstitial opacity. Heart size upper limits of normal. No pneumothorax. Diffuse sclerosis and lucent lesions within the clavicles, ribs and shoulders. IMPRESSION: 1. Low lung volumes without consolidation or effusion. Diffuse bilateral interstitial opacity of unknown chronicity. Findings could be secondary to interstitial inflammatory process, atypical/viral pneumonia, or possible metastatic disease given clinical history. CT chest suggested for further evaluation 2. Sclerotic and lytic lesions within the clavicles, bilateral ribs and shoulders, concerning for diffuse skeletal metastatic disease Electronically Signed   By: Madie Reno.D.  On: 10/26/2018 20:11   Dg Abd 2 Views  Result Date: 11/13/2018 CLINICAL DATA:  Pt c/o sob, weakness, cancer patient. EXAM: ABDOMEN - 2 VIEW COMPARISON:  None. FINDINGS: Overall visualized bowel gas pattern is nonobstructive. Moderate amount of stool and gas throughout the colon. No evidence of free intraperitoneal air seen. Diffuse osseous metastases, as previously described. IMPRESSION: 1. Nonobstructive bowel gas pattern. Moderate amount of stool and gas throughout the colon. 2. Diffuse osseous metastases. Electronically Signed   By: Franki Cabot M.D.   On: 11/13/2018 12:33   Vas [place] Lower Extremity Venous (dvt)  Result Date: 11/18/2018  Lower Venous Study Indications: Swelling.  Risk Factors: Cancer. Limitations: Body habitus and poor ultrasound/tissue interface. Comparison Study: No prior studies. Performing Technologist: Oliver Hum RVT  Examination Guidelines: A complete evaluation includes B-mode imaging, spectral Doppler, color Doppler, and power Doppler as needed of all accessible portions of each vessel. Bilateral testing is considered an integral part of a complete examination. Limited examinations for reoccurring indications may be performed as noted.  +---------+---------------+---------+-----------+----------+-------+ RIGHT     CompressibilityPhasicitySpontaneityPropertiesSummary +---------+---------------+---------+-----------+----------+-------+ CFV      Full           Yes      Yes                          +---------+---------------+---------+-----------+----------+-------+ SFJ      Full                                                 +---------+---------------+---------+-----------+----------+-------+ FV Prox  Full                                                 +---------+---------------+---------+-----------+----------+-------+ FV Mid   Full                                                 +---------+---------------+---------+-----------+----------+-------+ FV DistalFull                                                 +---------+---------------+---------+-----------+----------+-------+ PFV      Full                                                 +---------+---------------+---------+-----------+----------+-------+ POP      Full           Yes      Yes                          +---------+---------------+---------+-----------+----------+-------+ PTV      Full                                                 +---------+---------------+---------+-----------+----------+-------+  PERO     Full                                                 +---------+---------------+---------+-----------+----------+-------+   +---------+---------------+---------+-----------+----------+-------+ LEFT     CompressibilityPhasicitySpontaneityPropertiesSummary +---------+---------------+---------+-----------+----------+-------+ CFV      Full           Yes      Yes                          +---------+---------------+---------+-----------+----------+-------+ SFJ      Full                                                 +---------+---------------+---------+-----------+----------+-------+ FV Prox  Full                                                  +---------+---------------+---------+-----------+----------+-------+ FV Mid   Full                                                 +---------+---------------+---------+-----------+----------+-------+ FV DistalFull                                                 +---------+---------------+---------+-----------+----------+-------+ PFV      Full                                                 +---------+---------------+---------+-----------+----------+-------+ POP      Full           Yes      Yes                          +---------+---------------+---------+-----------+----------+-------+ PTV      Full                                                 +---------+---------------+---------+-----------+----------+-------+ PERO     Full                                                 +---------+---------------+---------+-----------+----------+-------+     Summary: Right: There is no evidence of deep vein thrombosis in the lower extremity. No cystic structure found in the popliteal fossa. Left: There is no evidence of deep vein thrombosis in the lower extremity. No cystic structure found in the popliteal fossa.  *See table(s) above for measurements  and observations. Electronically signed by Deitra Mayo MD on 11/18/2018 at 11:42:50 AM.    Final    Ir Thoracentesis Asp Pleural Space W/img Guide  Result Date: 10/29/2018 INDICATION: Patient with history of metastatic disease on imaging, dyspnea, and bilateral pleural effusions. Request is made for therapeutic left thoracentesis. EXAM: ULTRASOUND GUIDED THERAPEUTIC LEFT THORACENTESIS MEDICATIONS: 10 mL 1% lidocaine COMPLICATIONS: None immediate. PROCEDURE: An ultrasound guided thoracentesis was thoroughly discussed with the patient and questions answered. The benefits, risks, alternatives and complications were also discussed. The patient understands and wishes to proceed with the procedure. Written consent was obtained.  Ultrasound was performed to localize and mark an adequate pocket of fluid in the left chest. The area was then prepped and draped in the normal sterile fashion. 1% Lidocaine was used for local anesthesia. Under ultrasound guidance a 6 Fr Safe-T-Centesis catheter was introduced. Thoracentesis was performed. The catheter was removed and a dressing applied. FINDINGS: A total of approximately 350 mL of hazy gold fluid was removed. IMPRESSION: Successful ultrasound guided left thoracentesis yielding 350 mL of pleural fluid. Read by: Earley Abide, PA-C Electronically Signed   By: Jerilynn Mages.  Shick M.D.   On: 10/29/2018 15:58   Ir Thoracentesis Asp Pleural Space W/img Guide  Result Date: 10/28/2018 INDICATION: Patient with history of metastatic disease (including bone metastases) on imaging, dyspnea, and bilateral pleural effusions (R>L). Request is made for diagnostic and therapeutic right thoracentesis. EXAM: ULTRASOUND GUIDED DIAGNOSTIC AND THERAPEUTIC RIGHT THORACENTESIS MEDICATIONS: 10 mL 1% lidocaine COMPLICATIONS: None immediate. PROCEDURE: An ultrasound guided thoracentesis was thoroughly discussed with the patient and questions answered. The benefits, risks, alternatives and complications were also discussed. The patient understands and wishes to proceed with the procedure. Written consent was obtained. Ultrasound was performed to localize and mark an adequate pocket of fluid in the right chest. The area was then prepped and draped in the normal sterile fashion. 1% Lidocaine was used for local anesthesia. Under ultrasound guidance a 6 Fr Safe-T-Centesis catheter was introduced. Thoracentesis was performed. The catheter was removed and a dressing applied. FINDINGS: A total of approximately 600 mL of clear gold fluid was removed. Samples were sent to the laboratory as requested by the clinical team. IMPRESSION: Successful ultrasound guided right thoracentesis yielding 600 mL of pleural fluid. Read by: Earley Abide,  PA-C Electronically Signed   By: Jerilynn Mages.  Shick M.D.   On: 10/28/2018 16:10   US Thoracentesis Asp Pleural Space W/img Guide  Result Date: 11/15/2018 INDICATION: Patient with history of metastatic breast cancer, dyspnea, bilateral pleural effusions L>R. Request is made for therapeutic left thoracentesis. EXAM: ULTRASOUND GUIDED THERAPEUTIC LEFT THORACENTESIS MEDICATIONS: 10 mL 1% lidocaine COMPLICATIONS: None immediate. PROCEDURE: An ultrasound guided thoracentesis was thoroughly discussed with the patient and questions answered. The benefits, risks, alternatives and complications were also discussed. The patient understands and wishes to proceed with the procedure. Written consent was obtained. Ultrasound was performed to localize and mark an adequate pocket of fluid in the left chest. The area was then prepped and draped in the normal sterile fashion. 1% Lidocaine was used for local anesthesia. Under ultrasound guidance a 6 Fr Safe-T-Centesis catheter was introduced. Thoracentesis was performed. The catheter was removed and a dressing applied. FINDINGS: A total of approximately 400 mL of dark red fluid was removed. IMPRESSION: Successful ultrasound guided left thoracentesis yielding 400 mL of pleural fluid. Read by: Earley Abide, PA-C Electronically Signed   By: Markus Daft M.D.   On: 11/15/2018 14:32    ASSESSMENT: 58 y.o.  [place], [place] woman presenting 10/26/2018 with right-sided inflammatory breast cancer, stage IV, involving lungs, lymph nodes and bones, as follows:             (a) chest CT scan 10/26/2018 shows bilateral pleural effusions, possible lymphangitic spread of tumor, diffuse bony metastatic disease, and significant axillary mediastinal and hilar adenopathy             (b) bone scan 10/27/2018 is a "near [place]" consistent with widespread bony metastatic disease             (c) head CT with and without contrast 10/30/2018 shows no intracranial metastatic disease, multiple  calvarial lesions             (d) CA-27-29 on 10/27/2018 was 1810.4  (1) pleural fluid from right thoracentesis 10/28/2018 confirms malignant cells consistent with a breast primary, strongly estrogen and progesterone receptor positive, HER-2 not amplified, with an MIB-1 of 2%  (2) anastrozole started 10/29/2018             (a) palbociclib to start as outpatient             (b) denosumab/xgeva started 11/13/2018  (3) associated problems:             (a) hypoxia secondary to effusions             (b) pain from bone lesions             (c) right upper extremity lymphedema             (d) anemia, requiring transfusion             (e) poor venous access  (4) genetics testing pending  (5) hypocalcemia  (6) hypokalemia  (7) Anemia  (8) thrombocytopenia  (9) goals of care  PLAN:  Giannamarie is still having some shortness of breath but reports that this is improving.  Using BiPAP intermittently.  She has not received much in terms of treatment for breast cancer standpoint.  As far as the breast cancer is concerned, it is not curable but it is treatable and it is currently being treated with anti-estrogens and denosumab, with CDK 4,6 inhibitors to be added. Patients on this treatment frequently improve (for example, the effusions resolve) and control their cancer for an average of 2 years before needing to change to more aggressive therapy. Recommend she continue her anastrozole.  If she remains inpatient, will consider adding Ibrance next week.   Since she remains very weak still and is at high risk for readmission, recommend skilled nursing facility placement.  This has been discussed with her and she is agreeable.  Corrected calcium remains low, but improving.  Continue management per hospitalist..  Hypocalcemia likely related to her recent Xgeva injection which was given on 11/13/2018.  Oral calcium will need to be continued as an outpatient.  The patient's hemoglobin is 8.6.  She is not  actively bleeding.  Recommend continued monitoring of her CBC and transfuse packed red blood cells if her hemoglobin is less than 7.0 or active bleeding.  No transfusion is indicated today.  She has mild thrombocytopenia likely related to her bone involvement of her cancer.  Recommend continued monitoring.  No transfusion is indicated.  Ongoing issues:  (1) medicaid application: can SW expedite? (2) advanced directives: can chaplain service have patient complete HCPOA and get it notarized? (3) placement: plan? Status?  Greatly appreciate your help to this complex resourceless patient  Will follow with you  LOS: 7 days   Mikey Bussing, DNP, AGPCNP-BC, AOCNP 11/21/18

## 2018-11-21 NOTE — Progress Notes (Addendum)
Daily Progress Note   Patient Name: Suzanne [name]       Date: 11/21/2018 DOB: [birth year]  Age: 58 y.o. MRN#: [id number] Attending Physician: Eugenie Filler, MD Primary Care Physician: Patient, No Pcp Per Admit Date: 11/14/2018  Reason for Consultation/Follow-up: Establishing goals of care  Subjective:  patient resting in chair, does not appear to be in distress, does get anxious easily, some what hard of hearing, getting a breathing treatment currently.     Length of Stay: 7  Current Medications: Scheduled Meds:  . amoxicillin-clavulanate  1 tablet Oral Q12H  . anastrozole  1 mg Oral Daily  . calcium-vitamin D  1 tablet Oral TID  . Chlorhexidine Gluconate Cloth  6 each Topical Daily  . enoxaparin (LOVENOX) injection  30 mg Subcutaneous Daily  . feeding supplement (ENSURE ENLIVE)  237 mL Oral BID BM  . furosemide  60 mg Intravenous Q8H  . ipratropium  0.5 mg Nebulization TID  . levalbuterol  0.63 mg Nebulization TID  . mouth rinse  15 mL Mouth Rinse q12n4p  . potassium chloride  40 mEq Oral Daily    Continuous Infusions: . sodium chloride Stopped (11/16/18 1407)    PRN Meds: sodium chloride, acetaminophen **OR** acetaminophen, bisacodyl, clotrimazole, levalbuterol, loperamide, LORazepam, morphine injection, ondansetron **OR** ondansetron (ZOFRAN) IV, oxyCODONE, polyethylene glycol, senna-docusate  Physical Exam         Appears anxious at times Regular work of breathing Some edema Awake alert Non focal Abdomen not distended S1 S2   Vital Signs: BP (!) 153/87   Pulse (!) 104   Temp 97.6 F (36.4 C) (Oral)   Resp (!) 26   Ht 5\' 2"  (1.575 m)   Wt 92.6 kg   LMP 05/28/2013 (Within Months)   SpO2 100%   BMI 37.34 kg/m  SpO2: SpO2: 100 % O2 Device: O2 Device:  Nasal Cannula O2 Flow Rate: O2 Flow Rate (L/min): 4 L/min  Intake/output summary:   Intake/Output Summary (Last 24 hours) at 11/21/2018 1052 Last data filed at 11/21/2018 0900 Gross per 24 hour  Intake 100 ml  Output 4350 ml  Net -4250 ml   LBM: Last BM Date: 11/20/18 Baseline Weight: Weight: 106 kg Most recent weight: Weight: 92.6 kg       Palliative Assessment/Data:    Flowsheet Rows  Most Recent Value  Intake Tab  Referral Department  Hospitalist  Unit at Time of Referral  Intermediate Care Unit  Palliative Care Primary Diagnosis  Cancer  Palliative Care Type  New Palliative care  Date first seen by Palliative Care  11/18/18  Clinical Assessment  Palliative Performance Scale Score  40%  Pain Max last 24 hours  4  Pain Min Last 24 hours  3  Dyspnea Max Last 24 Hours  4  Dyspnea Min Last 24 hours  3  Nausea Max Last 24 Hours  3  Nausea Min Last 24 Hours  2  Anxiety Max Last 24 Hours  5  Anxiety Min Last 24 Hours  4  Psychosocial & Spiritual Assessment  Palliative Care Outcomes      Patient Active Problem List   Diagnosis Date Noted  . Acute on chronic respiratory failure with hypoxia ([place]) 11/19/2018  . Pleural effusion, malignant 11/19/2018  . Hypophosphatemia 11/19/2018  . Hypocalcemia 11/19/2018  . Aspiration pneumonia ([place]) 11/19/2018  . SOB (shortness of breath)   . Palliative care by specialist   . Carcinoma of breast, estrogen receptor positive, stage 4, right ([place]) 11/16/2018  . Hypoalbuminemia 11/16/2018  . Pleural effusion 11/14/2018  . Goals of care, counseling/discussion 11/06/2018  . Malignant neoplasm of overlapping sites of right breast in female, estrogen receptor positive ([place]) 10/31/2018  . Lung metastasis ([place]) 10/31/2018  . Pain from bone metastases ([place]) 10/31/2018  . Morbid obesity with BMI of 40.0-44.9, adult ([place]) 10/31/2018  . Bone injury   . Pleural effusion, bilateral   . Shortness of breath   . Abnormal breast finding   .  Hypokalemia   . Breast skin changes   . Anemia   . Bone metastasis ([place])   . Acute anemia 10/26/2018    Palliative Care Assessment & Plan   Patient Profile:    Assessment: 1.Malignant pleural effusions, metastatic stage IV breast cancer with malignant effusion, possible aspiration pneumonia, volume overload causing acute on chronic hypoxic respiratory failure.  2. Anxiety, shortness of breath.    Recommendations/Plan:  med history noted, continue current mode of care Home with home PT and home health, with her daughter, recommend out patient palliative and regular med onc follow up after discharge.  Appreciate chaplain consult, awaiting completion of HCPOA paperwork, patient elects her daughter to be her HCPOA agent. Full code, full scope for now. Med onc following as well   Code Status:    Code Status Orders  (From admission, onward)         Start     Ordered   11/16/18 2007  Full code  Continuous     11/16/18 2008        Code Status History    Date Active Date Inactive Code Status Order ID Comments User Context   11/16/2018 1916 11/16/2018 2008 DNR [id number]  Laurin Coder, MD Inpatient   11/14/2018 2104 11/16/2018 1916 Full Code [id number]  Harvie Bridge, DO Inpatient   10/26/2018 2342 10/30/2018 2112 Full Code [id number]  Danna Hefty, DO ED   Advance Care Planning Activity       Prognosis:   Unable to determine  Discharge Planning:  Home with Home Health Recommend out patient palliative care.   Care plan was discussed with  Patient and RT  Thank you for allowing the Palliative Medicine Team to assist in the care of this patient.   Time In:  10 Time Out: 10.25 Total Time 25 Prolonged Time Billed  no       Greater than 50%  of this time was spent counseling and coordinating care related to the above assessment and plan.  Loistine Chance, MD [phone] Please contact Palliative Medicine Team phone at [phone] for questions and concerns.

## 2018-11-22 LAB — CBC WITH DIFFERENTIAL/PLATELET
Abs Immature Granulocytes: 0.58 10*3/uL — ABNORMAL HIGH (ref 0.00–0.07)
Basophils Absolute: 0 10*3/uL (ref 0.0–0.1)
Basophils Relative: 0 %
Eosinophils Absolute: 0.3 10*3/uL (ref 0.0–0.5)
Eosinophils Relative: 3 %
HCT: 28.3 % — ABNORMAL LOW (ref 36.0–46.0)
Hemoglobin: 8.5 g/dL — ABNORMAL LOW (ref 12.0–15.0)
Immature Granulocytes: 6 %
Lymphocytes Relative: 34 %
Lymphs Abs: 3.2 10*3/uL (ref 0.7–4.0)
MCH: 31.4 pg (ref 26.0–34.0)
MCHC: 30 g/dL (ref 30.0–36.0)
MCV: 104.4 fL — ABNORMAL HIGH (ref 80.0–100.0)
Monocytes Absolute: 0.4 10*3/uL (ref 0.1–1.0)
Monocytes Relative: 4 %
Neutro Abs: 5.1 10*3/uL (ref 1.7–7.7)
Neutrophils Relative %: 53 %
Platelets: 103 10*3/uL — ABNORMAL LOW (ref 150–400)
RBC: 2.71 MIL/uL — ABNORMAL LOW (ref 3.87–5.11)
RDW: 18.1 % — ABNORMAL HIGH (ref 11.5–15.5)
WBC: 9.6 10*3/uL (ref 4.0–10.5)
nRBC: 0.8 % — ABNORMAL HIGH (ref 0.0–0.2)

## 2018-11-22 LAB — BASIC METABOLIC PANEL
Anion gap: 12 (ref 5–15)
BUN: 16 mg/dL (ref 6–20)
CO2: 32 mmol/L (ref 22–32)
Calcium: 6.3 mg/dL — CL (ref 8.9–10.3)
Chloride: 94 mmol/L — ABNORMAL LOW (ref 98–111)
Creatinine, Ser: 0.66 mg/dL (ref 0.44–1.00)
GFR calc Af Amer: >60 mL/min (ref >60)
GFR calc non Af Amer: >60 mL/min (ref >60)
Glucose, Bld: 131 mg/dL — ABNORMAL HIGH (ref 70–99)
Potassium: 3.9 mmol/L (ref 3.5–5.1)
Sodium: 138 mmol/L (ref 135–145)

## 2018-11-22 LAB — MAGNESIUM: Magnesium: 2 mg/dL (ref 1.7–2.4)

## 2018-11-22 LAB — GLUCOSE, CAPILLARY: Glucose-Capillary: 161 mg/dL — ABNORMAL HIGH (ref 70–99)

## 2018-11-22 MED ORDER — CALCIUM GLUCONATE-NACL 2-0.675 GM/100ML-% IV SOLN
2.0000 g | Freq: Once | INTRAVENOUS | Status: AC
  Administered 2018-11-22: 2000 mg via INTRAVENOUS
  Filled 2018-11-22: qty 100

## 2018-11-22 MED ORDER — CALCIUM GLUCONATE-NACL 1-0.675 GM/50ML-% IV SOLN: 1.0000 g | INTRAVENOUS | Status: DC

## 2018-11-22 MED ORDER — METOPROLOL TARTRATE 5 MG/5ML IV SOLN
5.0000 mg | Freq: Once | INTRAVENOUS | Status: AC
  Administered 2018-11-22: 5 mg via INTRAVENOUS
  Filled 2018-11-22: qty 5

## 2018-11-22 MED ORDER — FUROSEMIDE 10 MG/ML IJ SOLN
60.0000 mg | Freq: Two times a day (BID) | INTRAMUSCULAR | Status: DC
  Administered 2018-11-22 – 2018-11-24 (×4): 60 mg via INTRAVENOUS
  Filled 2018-11-22 (×4): qty 6

## 2018-11-22 NOTE — Progress Notes (Signed)
Offered BIPAP to patient at hour of sleep and patient stated that she thought she would be ok without it tonight.  Reminded her that she could change her mind if she decided she needed it throughout the night.

## 2018-11-22 NOTE — TOC Initial Note (Addendum)
Transition of Care [facility]) - Initial/Assessment Note    Patient Details  Name: Suzanne [name] MRN: [id number] Date of Birth: [birth year]  [facility] CM/SW Contact:    Claudine Mouton, LCSW Phone Number: 11/22/2018, 3:43 PM  Clinical Narrative:     CSW met with the pt who presented as somewhat anxious and emotionally overwhelmed. CSW sought to put the pt at ease and explain the CSW's role and pt responded appropriately and thanked the CSW.  CSW stated it was the hope of the staff that the pt would continue to move forward and engage in [place] for her current condition and that it was hoped by many who were working with her currently at [facility] were wanting to encourage the pt that this [place] and engaging in it now, could possibly have some very favorable outcomes and thanked the pt for meeting with the CSW to discuss this.  Pt was agreeable to engaging in [place] and if placement could be found at a SNF the pt stated she would be agreeable to going to a SNF.  CSW discussed the barriers (lack of a payor, etc) but encouraged the pt to let others worry about these types of issues and continue to focus on getting well and engaging in [place].  Pt was appreciative, thanked the CSW and provided verbal permission to the CSW/CSW Dept to reach out to facilities, create a FL-2 and make any referrals as needed, as the processes for seeking disability were progressing.  CSW then spoke to the pt's daughter who stated it was her understanding that a CSW at [facility] was supposed to be working on the pt's medicare/medicaid and that the [place] at the [facility] was attempting to verify whether this was true, or not.  Per the pt's daughter Sondra Blixt the pt's daughter's correct landline # is [phone] (and that the numbers in the chart are now correct), but that the pt's cell is [phone] and that the pt's daughter states that she can be reached at both of these numbers.  Per the pt's daughter, the  [facility] CSW is currently assisting the pt's daughter with seeking disability as the pt's daughter is disabled, due to cerebral palsy, by having connected the pt's daughter with the [facility].  Pt's daughter presented as extremely emotional due to her condition which causes her extreme emotional difficulties when she is overwhelmed, per the pt's daughter.  Pt's daughter was worried that procedures previously made for the [place] would be taken care of from within the actual hospital where the pt was transported to and CSW assured the pt's daughter's that the RN's will be aware but that the pt's daughter could certainly ask the pt's RN about any questions she may have.  Pt's daughter presented as extremely anxious and occasionally became upset but attributed this to her medical condition and thanked the CSW and seemed assured by the CSW's answers.  Pt's daughter was appreciative and thanked the CSW.          Expected Discharge Plan: Skilled Nursing Facility Barriers to Discharge: [place] (PASRR), Inadequate or no insurance, Continued Medical Work up   Patient Goals and CMS Choice        Expected Discharge Plan and Services Expected Discharge Plan: [place]  Prior Living Arrangements/Services   Lives with:: Adult Children Patient language and need for interpreter reviewed:: No Do you feel safe going back to the place where you live?: No      Need for Family Participation in Patient Care: Yes (Comment) Care giver support system in place?: (Pt's daughter is supportive and closely involved but is also disabled (cerebral palsy).)      Activities of Daily Living Home Assistive Devices/Equipment: Bedside commode/3-in-1, Walker (specify type) ADL Screening (condition at time of admission) Patient's cognitive ability adequate to safely complete daily activities?: Yes Is the  patient deaf or have difficulty hearing?: Yes Does the patient have difficulty seeing, even when wearing glasses/contacts?: No Does the patient have difficulty concentrating, remembering, or making decisions?: No Patient able to express need for assistance with ADLs?: Yes Does the patient have difficulty dressing or bathing?: Yes Independently performs ADLs?: No Communication: Independent Dressing (OT): Needs assistance Is this a change from baseline?: Pre-admission baseline Grooming: Needs assistance Is this a change from baseline?: Pre-admission baseline Feeding: Independent Bathing: Needs assistance Is this a change from baseline?: Pre-admission baseline Toileting: Needs assistance Is this a change from baseline?: Pre-admission baseline In/Out Bed: Needs assistance Is this a change from baseline?: Pre-admission baseline Walks in Home: Independent with device (comment) Does the patient have difficulty walking or climbing stairs?: Yes Weakness of Legs: Both Weakness of Arms/Hands: Both  Permission Sought/Granted Permission sought to share information with : Chartered certified accountant granted to share information with : Yes, Release of Information Signed              Emotional Assessment Appearance:: Appears stated age   Affect (typically observed): Calm, Anxious, Afraid/Fearful, Overwhelmed Orientation: : Oriented to Self, Oriented to Place, Oriented to  Time, Oriented to Situation      Admission diagnosis:  Shortness of breath [R06.02] Patient Active Problem List   Diagnosis Date Noted  . Acute on chronic respiratory failure with hypoxia ([place]) 11/19/2018  . Pleural effusion, malignant 11/19/2018  . Hypophosphatemia 11/19/2018  . Hypocalcemia 11/19/2018  . Aspiration pneumonia ([place]) 11/19/2018  . SOB (shortness of breath)   . Palliative care by specialist   . Carcinoma of breast, estrogen receptor positive, stage 4, right ([place]) 11/16/2018  .  Hypoalbuminemia 11/16/2018  . Pleural effusion 11/14/2018  . Goals of care, counseling/discussion 11/06/2018  . Malignant neoplasm of overlapping sites of right breast in female, estrogen receptor positive ([place]) 10/31/2018  . Lung metastasis ([place]) 10/31/2018  . Pain from bone metastases ([place]) 10/31/2018  . Morbid obesity with BMI of 40.0-44.9, adult ([place]) 10/31/2018  . Bone injury   . Pleural effusion, bilateral   . Shortness of breath   . Abnormal breast finding   . Hypokalemia   . Breast skin changes   . Anemia   . Bone metastasis ([place])   . Acute anemia 10/26/2018   PCP:  Patient, No Pcp Per Pharmacy:   [facility] [place], [place] - [address] AT [address] [place] [address] Phone: [phone] Fax: [place], [place] - [address] [place] [address] Phone: [phone] Fax: [phone]     Social Determinants of Health (SDOH) Interventions    Readmission Risk Interventions Readmission Risk Prevention Plan 11/17/2018  Transportation Screening Complete  PCP or Specialist Appt within 3-5 Days Not Complete  Not Complete comments will need pcp  set up  [place] or [place] Complete  Social Work Consult for [place] Planning/Counseling Complete  Palliative Care Screening Not Complete  Palliative Care Screening Not Complete Comments pending- bedside RN states plan to consult palliative this admission  Medication Review (RN Care Manager) Complete  Some recent data might be hidden

## 2018-11-22 NOTE — Progress Notes (Signed)
CSW spoke to the pt's daughter who stated it was her understanding that a CSW at [facility] was supposed to be working on the pt's medicare/medicaid and that the [place] at the [facility] was attempting to verify whether this was true, or not.  Per the pt's daughter Madoline Bhatt the pt's daughter's correct landline # is [phone] (and that the numbers in the chart are now correct), but that the pt's cell is [phone] and that the pt's daughter states that she can be reached at both of these numbers.  Per the pt's daughter, the [facility] CSW is currently assisting the pt's daughter with seeking disability as the pt's daughter is disabled, due to cerebral palsy, by having connected the pt's daughter with the [facility].  Pt's daughter presented as extremely emotional due to her condition which causes her extreme emotional difficulties when she is overwhelmed, per the pt's daughter.  Pt's daughter was worried that procedures previously made for the [place] would be taken care of from within the actual hospital where the pt was transported to and CSW assured the pt's daughter's that the RN's will be aware but that the pt's daughter could certainly ask the pt's RN about any questions she may have.  Pt's daughter presented as extremely anxious and occasionally became upset but attributed this to her medical condition and thanked the CSW and seemed assured by the CSW's answers.  Pt's daughter was appreciative and thanked the CSW.  CSW will continue to follow for D/C needs.  Suzanne [name]. Tomi Paddock, LCSW, LCAS, CSI Transitions of Care Clinical Social Worker Care Coordination Department Ph: [phone]

## 2018-11-22 NOTE — Progress Notes (Signed)
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  Patient admitted underwent thoracentesis of the right and left lungs on 6/2 and 6/3 and scheduled for repeat 3 days later. Patient was admitted respiratory status worsened and patient placed on the BiPAP and transferred to the stepdown unit.  Patient also placed on IV diuretics.  Patient noted to spike a fever and as such also placed on IV antibiotics for presumed aspiration pneumonia.  Due to tenuous respiratory status critical care consulted. Patient underwent left-sided thoracentesis on 11/15/2018 with 400 cc of back red pleural fluid removed.  Cytology from thoracentesis of 10/28/2018+ for malignant cells consistent with breast cancer.  COVID-19 negative.  IV antibiotics narrowed down to oral Augmentin starting 11/19/2018.  Lower extremity Dopplers which were done were negative.  Patient now on intermittent BiPAP mainly at night and on 3 L nasal cannula in the daytime however still with use of accessory muscles of respiration with poor air movement.  Palliative care consulted for goals of care.  Oncology following as well.   Assessment & Plan:   Principal Problem:   Acute on chronic respiratory failure with hypoxia (HCC) Active Problems:   Shortness of breath   Hypokalemia   Anemia   Bone metastasis (HCC)   Pleural effusion, bilateral   Malignant neoplasm of overlapping sites of right breast in female, estrogen receptor positive ([place])   Morbid obesity with BMI of 40.0-44.9, adult (HCC)   Carcinoma of  breast, estrogen receptor positive, stage 4, right (HCC)   Hypoalbuminemia   Palliative care by specialist   Pleural effusion, malignant   Hypophosphatemia   Hypocalcemia   Aspiration pneumonia (HCC)   SOB (shortness of breath)  1 acute on chronic hypoxic respiratory failure Felt likely multifactorial secondary to malignant pleural effusions, metastatic stage IV breast cancer with malignant effusion, possible aspiration pneumonia, volume overload. Patient now requiring noninvasive BiPAP on and off and wearing it intermittently.  Patient was on BiPAP overnight however asked for to be discontinued.  Currently on 4 L nasal cannula.  Patient status post thoracentesis on the left 11/23/2018 with 400 cc of dark red pleural fluid removed.  Last chest x-ray done (6/24) was stable low volume chest with atelectasis and interstitial opacity.  CT chest done on 11/17/2018 with widespread metastatic disease to the bones redemonstrated with multiple nondisplaced minimally displaced bilateral rib fractures as well as new widely displaced fracture of the medial aspect of the right clavicle with probable adjacent intramuscular hemorrhage in the lower right sternocleidomastoid muscle and upper right pectoralis major muscle.  Probable tumor infiltration throughout the right breast as well as bilateral axillary and soft pectoral lymphadenopathy.  Unusual appearance of the lungs similar to prior study could suggest chronic systemic disease such as sarcoidosis or interstitial lung disease.  Possibility of lymphangitic spread of tumor in the lungs could be considered but not strongly favored.  Moderate left pleural effusion and small right pleural effusion.  Cholelithiasis.  Lower extremity Dopplers negative for DVT.  COVID-19 test negative.  Patient still with increased O2 requirements and shortness of breath with use  of accessory muscles of respiration over the past few days which is slowly improving with diuresis and  antibiotics.  On IV Lasix with a urine output of 3.8 L over the past 24 hours.  Patient is - 10.538 L during this hospitalization.  Current weight of 198.19 pounds from 204.15 pounds from 210 pounds from 233.69 pounds on admission.  Patient was empirically on IV antibiotics of cefepime and subsequently Zosyn and has been transitioned to Augmentin per PCCM.  Continue pulmonary hygiene.  Continue scheduled Xopenex and Atrovent nebs.  Decrease Lasix to 60 mg IV every 12 hours.  Strict I's and O's.  Daily weights.  PCCM following and appreciate their input and recommendations.  2.  Malignant pleural effusion/loculated pleural effusion/right-sided inflammatory stage IV breast cancer involving lungs, lymph nodes, bones Status post thoracentesis on the left with 400 cc of dark red pleural fluid removed.  Cytology from thoracentesis 10/28/2018+ for malignant cells consistent with breast cancer.  Patient may require Pleurx catheter if recurrence of effusion however not at this time per PCCM.  Patient with extensive progressive disease.  Oncology and palliative care following.  Continue Arimidex.  Oncology planning to add palbociclib as an outpatient however per oncology a prolonged hospitalization will probably start palbociclib next week.  Per oncology.  3.  Hypocalcemia Likely secondary to recent Xgeva injection given on 11/13/2018.  Corrected calcium at 7.7.  Increased oral calcium supplementation to 3 times daily.  Patient status post IV calcium gluconate on 11/18/2018.  Patient has been ordered IV calcium gluconate 1 g IV x3 doses on 11/21/2018.  We will give IV calcium gluconate 2 g IV x1 today 11/22/2018.  Keep magnesium greater than 2.  Follow.   4.  Hypokalemia Likely secondary to diuresis.  Continue K. Dur 40 mEq p.o. daily.  Magnesium at 2.  Follow.   5.  Bilateral lower extremity rash on feet and legs Unclear etiology.  Patient with some mild petechia which is slowly improving.  May be secondary to  transfusion obtained prior to admission.  Follow for now.  6.  Probable aspiration pneumonia Patient noted to be febrile during this hospitalization.  Fever curve trended down.  MRSA PCR negative.  Patient was on IV cefepime subsequently transitioned to IV Zosyn which have been discontinued.  Patient transitioned to oral Augmentin for 3 more days per pulmonary.  Discontinue Augmentin after today's doses.  7.  Hypophosphatemia Repleted.  8.  Microcytic anemia Patient received transfusion prior to admission.  Patient with no signs or symptoms of bleeding.  Hemoglobin at 8.5.  Follow.  Transfusion threshold hemoglobin less than 7.  Oncology following.  9.  Volume overload in the setting of blood transfusion versus suspected acute diastolic CHF Patient noted to be short of breath posttransfusion.  Patient given IV Lasix during the hospitalization and was on 40 IV every 12 hours down from 80 IV every 12 hours.  2D echo from 10/27/2018 with a EF of 60 to 62%, diastolic Doppler parameters indeterminate.  Patient with a urine output of 3.8 L over the past 24 hours.  Patient is -10.538 L during this hospitalization.  Lasix increased to 60 mg IV every 8 hours on 11/20/2018.  Patient's current weight is 198.19 pounds from 204.15 pounds from 210.1 pounds from 233.69 pounds on admission.  Lasix to 60 mg IV every 12 hours and could likely transition to oral Lasix tomorrow.  Strict I's and O's.  Daily weights. Follow.  10.  Obesity  11.  Diarrhea  Patient with loose stools which are improving after being given dose of Imodium on 11/20/2018.  Rectal pouch has been discontinued.  Continue Imodium as needed.    DVT prophylaxis: Lovenox. Code Status: Full Family Communication: Updated patient.  No family at bedside. Disposition Plan: Remain in stepdown unit today.  High risk for decompensation.  Will likely need skilled nursing facility on discharge as patient with significant weakness.  If continued improvement  could likely transfer to the floor tomorrow.  If continued improvement could likely transfer to floor tomorrow.   Consultants:   PCCM: Dr. Carson Myrtle 11/16/2018  Palliative care: Dr. Rowe Pavy 11/18/2018  Allergy: Dr. Jana Hakim 11/17/2018  Procedures:  6/02 Right Pleural Fluid Cytology >> positive for malignant cells c/w breast cancer  6/20 Left Thoracentesis >> 400 ml dark red pleural fluid removed  11/17/2018 lower extremity Dopplers Chest x-ray: 11/14/2018, 11/15/2018, 11/16/2018, 11/17/2018, 11/18/2018, 11/19/2018 CT chest 11/17/2018    Antimicrobials:  Cefepime 6/21  Zosyn 6/22-6/24 Augmentin 6/24 (for 3 days)>>>> 11/22/2018  Micro Data:  COVID 6/19 >> negative     Subjective: Patient sitting up in bed.  Feels shortness of breath improving daily.  States she feels anxious with the BiPAP.  Patient currently on 4 L nasal cannula.  Patient states loose stools have decreased.  Denies any chest pain.  No abdominal pain.  Rectal tube has been discontinued.   Objective: Vitals:   11/22/18 0800 11/22/18 0808 11/22/18 0900 11/22/18 1000  BP: (!) 158/90  (!) 144/79 (!) 157/85  Pulse: (!) 113  (!) 113 (!) 120  Resp: (!) 41  (!) 30 (!) 21  Temp:      TempSrc:      SpO2: 98% 100% 96% 97%  Weight:      Height:        Intake/Output Summary (Last 24 hours) at 11/22/2018 1019 Last data filed at 11/22/2018 1009 Gross per 24 hour  Intake 700 ml  Output 3975 ml  Net -3275 ml   Filed Weights   11/20/18 0323 11/21/18 0439 11/22/18 0500  Weight: 95.3 kg 92.6 kg 89.9 kg    Examination:  General exam: Less respiratory distress. Respiratory system: Poor air movement.  Decreased diffuse crackles.  No use of accessory muscles of respiration.  Speaking in full sentences.  Cardiovascular system: Tachycardia.Marland Kitchen+ JVD, murmurs, rubs, gallops or clicks.  Trace to 1+ bilateral lower extremity edema.  Gastrointestinal system: Abdomen is soft, nontender, nondistended, positive bowel sounds.  No rebound.  No  guarding.  Central nervous system: Alert and oriented. No focal neurological deficits. Extremities: Trace to 1+ bilateral lower extremity edema.  Symmetric 5 x 5 power. Skin: Slowly improving petechial rash on bilateral feet and distal lower extremities. Psychiatry: Judgement and insight appear normal. Mood & affect appropriate.     Data Reviewed: I have personally reviewed following labs and imaging studies  CBC: Recent Labs  Lab 11/17/18 0215 11/18/18 0955 11/19/18 0231 11/20/18 0223 11/21/18 0222 11/22/18 0224  WBC 7.7 7.8 8.0 9.1 8.9 9.6  NEUTROABS 5.4 5.2 5.1 5.5  --  5.1  HGB 8.5* 9.0* 8.1* 8.0* 8.6* 8.5*  HCT 27.3* 27.7* 26.5* 26.7* 28.8* 28.3*  MCV 102.6* 99.6 103.9* 104.3* 106.3* 104.4*  PLT 101* 99* 96* 97* 97* 160*   Basic Metabolic Panel: Recent Labs  Lab 11/16/18 0502 11/17/18 0215 11/18/18 0955 11/19/18 0231 11/19/18 1511 11/20/18 0223 11/21/18 0222 11/22/18 0224  NA 135 137 140 137 140 139 141 138  K 3.7 2.8* 3.4* 2.6* 3.0* 3.1*  3.4* 3.9  CL 94* 93* 96* 92* 92* 93* 94* 94*  CO2 29 30 27 31  36* 33* 35* 32  GLUCOSE 119* 117* 115* 114* 208* 156* 131* 131*  BUN 10 15 14 13 12 12 13 16   CREATININE 0.66 0.66 0.68 0.71 0.65 0.58 0.60 0.66  CALCIUM 5.8* 5.2* 5.4* 5.1* 5.2* 5.1* 5.8* 6.3*  MG 1.9 2.2 2.0 1.9  --  2.0 1.9 2.0  PHOS 2.4* 2.3* 1.8* 1.8*  --  2.5  --   --    GFR: Estimated Creatinine Clearance: 78.9 mL/min (by C-G formula based on SCr of 0.66 mg/dL). Liver Function Tests: Recent Labs  Lab 11/16/18 0502 11/17/18 0215 11/18/18 0955 11/19/18 0231  AST 31 22 22 22   ALT 18 16 15 14   ALKPHOS 268* 208* 195* 194*  BILITOT 0.8 0.9 0.8 0.8  PROT 5.9* 6.2* 6.3* 5.7*  ALBUMIN 1.9* 2.3* 2.6* 2.3*   No results for input(s): LIPASE, AMYLASE in the last 168 hours. No results for input(s): AMMONIA in the last 168 hours. Coagulation Profile: No results for input(s): INR, PROTIME in the last 168 hours. Cardiac Enzymes: No results for input(s):  CKTOTAL, CKMB, CKMBINDEX, TROPONINI in the last 168 hours. BNP (last 3 results) No results for input(s): PROBNP in the last 8760 hours. HbA1C: No results for input(s): HGBA1C in the last 72 hours. CBG: Recent Labs  Lab 11/20/18 2121  GLUCAP 140*   Lipid Profile: No results for input(s): CHOL, HDL, LDLCALC, TRIG, CHOLHDL, LDLDIRECT in the last 72 hours. Thyroid Function Tests: No results for input(s): TSH, T4TOTAL, FREET4, T3FREE, THYROIDAB in the last 72 hours. Anemia Panel: No results for input(s): VITAMINB12, FOLATE, FERRITIN, TIBC, IRON, RETICCTPCT in the last 72 hours. Sepsis Labs: Recent Labs  Lab 11/16/18 0910 11/16/18 1158 11/17/18 0215 11/18/18 0212  PROCALCITON 1.02  --  2.30 1.69  LATICACIDVEN 1.5 1.3  --   --     Recent Results (from the past 240 hour(s))  SARS Coronavirus 2 (CEPHEID - Performed in [place] hospital lab), Hosp Order     Status: None   Collection Time: 11/14/18  4:34 PM   Specimen: Nasopharyngeal Swab  Result Value Ref Range Status   SARS Coronavirus 2 NEGATIVE NEGATIVE Final    Comment: (NOTE) If result is NEGATIVE SARS-CoV-2 target nucleic acids are NOT DETECTED. The SARS-CoV-2 RNA is generally detectable in upper and lower  respiratory specimens during the acute phase of infection. The lowest  concentration of SARS-CoV-2 viral copies this assay can detect is 250  copies / mL. A negative result does not preclude SARS-CoV-2 infection  and should not be used as the sole basis for treatment or other  patient management decisions.  A negative result may occur with  improper specimen collection / handling, submission of specimen other  than nasopharyngeal swab, presence of viral mutation(s) within the  areas targeted by this assay, and inadequate number of viral copies  (<250 copies / mL). A negative result must be combined with clinical  observations, patient history, and epidemiological information. If result is POSITIVE SARS-CoV-2 target  nucleic acids are DETECTED. The SARS-CoV-2 RNA is generally detectable in upper and lower  respiratory specimens dur ing the acute phase of infection.  Positive  results are indicative of active infection with SARS-CoV-2.  Clinical  correlation with patient history and other diagnostic information is  necessary to determine patient infection status.  Positive results do  not rule out bacterial infection or co-infection with other  viruses. If result is PRESUMPTIVE POSTIVE SARS-CoV-2 nucleic acids MAY BE PRESENT.   A presumptive positive result was obtained on the submitted specimen  and confirmed on repeat testing.  While 2019 novel coronavirus  (SARS-CoV-2) nucleic acids may be present in the submitted sample  additional confirmatory testing may be necessary for epidemiological  and / or clinical management purposes  to differentiate between  SARS-CoV-2 and other Sarbecovirus currently known to infect humans.  If clinically indicated additional testing with an alternate test  methodology [phone]) is advised. The SARS-CoV-2 RNA is generally  detectable in upper and lower respiratory sp ecimens during the acute  phase of infection. The expected result is Negative. Fact Sheet for Patients:  StrictlyIdeas.no Fact Sheet for Healthcare Providers: BankingDealers.co.za This test is not yet approved or cleared by the [place] FDA and has been authorized for detection and/or diagnosis of SARS-CoV-2 by FDA under an Emergency Use Authorization (EUA).  This EUA will remain in effect (meaning this test can be used) for the duration of the COVID-19 declaration under Section 564(b)(1) of the Act, 21 U.S.C. section 360bbb-3(b)(1), unless the authorization is terminated or revoked sooner. Performed at [facility], [place] [address] [place], [place] [address]   MRSA PCR Screening     Status: None   Collection Time: 11/16/18  8:02  AM   Specimen: Nasal Mucosa; Nasopharyngeal  Result Value Ref Range Status   MRSA by PCR NEGATIVE NEGATIVE Final    Comment:        The GeneXpert MRSA Assay (FDA approved for NASAL specimens only), is one component of a comprehensive MRSA colonization surveillance program. It is not intended to diagnose MRSA infection nor to guide or monitor treatment for MRSA infections. Performed at [facility], [place] [address] [place], [place] [address]          Radiology Studies: Dg Chest Port 1 View  Result Date: 11/20/2018 CLINICAL DATA:  Hypoxia, metastatic breast cancer EXAM: PORTABLE CHEST 1 VIEW COMPARISON:  Portable exam 1200 hours compared to 11/19/2018 FINDINGS: Very low lung volumes. Stable heart size mediastinal contours. BILATERAL pulmonary opacities again identified, unchanged. No new infiltrate, pleural effusion or pneumothorax. Scattered areas of osseous sclerosis consistent with osseous metastatic disease IMPRESSION: Widespread osseous metastatic disease. Persistent pulmonary opacities. Electronically Signed   By: Lavonia Dana M.D.   On: 11/20/2018 14:06        Scheduled Meds:  anastrozole  1 mg Oral Daily   calcium-vitamin D  1 tablet Oral TID   Chlorhexidine Gluconate Cloth  6 each Topical Daily   enoxaparin (LOVENOX) injection  30 mg Subcutaneous Daily   feeding supplement (ENSURE ENLIVE)  237 mL Oral BID BM   furosemide  60 mg Intravenous Q8H   ipratropium  0.5 mg Nebulization TID   levalbuterol  0.63 mg Nebulization TID   mouth rinse  15 mL Mouth Rinse q12n4p   potassium chloride  40 mEq Oral Daily   Continuous Infusions:  sodium chloride Stopped (11/16/18 1407)     LOS: 8 days    Time spent: 45 minutes    Irine Seal, MD [facility]  If 7PM-7AM, please contact night-coverage [email or web address] 11/22/2018, 10:19 AM

## 2018-11-23 LAB — BASIC METABOLIC PANEL
Anion gap: 12 (ref 5–15)
BUN: 17 mg/dL (ref 6–20)
CO2: 32 mmol/L (ref 22–32)
Calcium: 6.8 mg/dL — ABNORMAL LOW (ref 8.9–10.3)
Chloride: 92 mmol/L — ABNORMAL LOW (ref 98–111)
Creatinine, Ser: 0.72 mg/dL (ref 0.44–1.00)
GFR calc Af Amer: >60 mL/min (ref >60)
GFR calc non Af Amer: >60 mL/min (ref >60)
Glucose, Bld: 138 mg/dL — ABNORMAL HIGH (ref 70–99)
Potassium: 3.8 mmol/L (ref 3.5–5.1)
Sodium: 136 mmol/L (ref 135–145)

## 2018-11-23 LAB — CBC WITH DIFFERENTIAL/PLATELET
Abs Immature Granulocytes: 0.6 10*3/uL — ABNORMAL HIGH (ref 0.00–0.07)
Basophils Absolute: 0.1 10*3/uL (ref 0.0–0.1)
Basophils Relative: 1 %
Eosinophils Absolute: 0.2 10*3/uL (ref 0.0–0.5)
Eosinophils Relative: 2 %
HCT: 29.2 % — ABNORMAL LOW (ref 36.0–46.0)
Hemoglobin: 8.6 g/dL — ABNORMAL LOW (ref 12.0–15.0)
Immature Granulocytes: 6 %
Lymphocytes Relative: 34 %
Lymphs Abs: 3.6 10*3/uL (ref 0.7–4.0)
MCH: 31.2 pg (ref 26.0–34.0)
MCHC: 29.5 g/dL — ABNORMAL LOW (ref 30.0–36.0)
MCV: 105.8 fL — ABNORMAL HIGH (ref 80.0–100.0)
Monocytes Absolute: 0.4 10*3/uL (ref 0.1–1.0)
Monocytes Relative: 4 %
Neutro Abs: 5.7 10*3/uL (ref 1.7–7.7)
Neutrophils Relative %: 53 %
Platelets: 112 10*3/uL — ABNORMAL LOW (ref 150–400)
RBC: 2.76 MIL/uL — ABNORMAL LOW (ref 3.87–5.11)
RDW: 17.5 % — ABNORMAL HIGH (ref 11.5–15.5)
WBC: 10.5 10*3/uL (ref 4.0–10.5)
nRBC: 1 % — ABNORMAL HIGH (ref 0.0–0.2)

## 2018-11-23 LAB — CALCIUM, IONIZED: Calcium, Ionized, Serum: 3.6 mg/dL — ABNORMAL LOW (ref 4.5–5.6)

## 2018-11-23 MED ORDER — LEVALBUTEROL HCL 0.63 MG/3ML IN NEBU
0.6300 mg | INHALATION_SOLUTION | Freq: Two times a day (BID) | RESPIRATORY_TRACT | Status: DC
  Administered 2018-11-23 – 2018-11-24 (×3): 0.63 mg via RESPIRATORY_TRACT
  Filled 2018-11-23 (×3): qty 3

## 2018-11-23 MED ORDER — CALCIUM GLUCONATE-NACL 2-0.675 GM/100ML-% IV SOLN
2.0000 g | Freq: Once | INTRAVENOUS | Status: AC
  Administered 2018-11-23: 2000 mg via INTRAVENOUS
  Filled 2018-11-23: qty 100

## 2018-11-23 MED ORDER — CARVEDILOL 3.125 MG PO TABS
3.1250 mg | ORAL_TABLET | Freq: Two times a day (BID) | ORAL | Status: DC
  Administered 2018-11-23 – 2018-11-24 (×3): 3.125 mg via ORAL
  Filled 2018-11-23 (×3): qty 1

## 2018-11-23 MED ORDER — IPRATROPIUM BROMIDE 0.02 % IN SOLN
0.5000 mg | Freq: Two times a day (BID) | RESPIRATORY_TRACT | Status: DC
  Administered 2018-11-23 – 2018-11-24 (×3): 0.5 mg via RESPIRATORY_TRACT
  Filled 2018-11-23 (×3): qty 2.5

## 2018-11-23 MED ORDER — GUAIFENESIN-DM 100-10 MG/5ML PO SYRP
5.0000 mL | ORAL_SOLUTION | ORAL | Status: DC | PRN
  Administered 2018-11-23 – 2018-11-25 (×4): 5 mL via ORAL
  Filled 2018-11-23 (×5): qty 10

## 2018-11-23 NOTE — Progress Notes (Signed)
Occupational Therapy Treatment Patient Details Name: Suzanne [name] MRN: [id number] DOB: 1960/10/03 Today's Date: 11/23/2018    History of present illness 58 y.o. 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  Patient underwent blood transfusion 11/13/18 and subsequently had SOB, DOE and orthopnea assoicated with cough. Dx of recurrent L pleural effusion, new onset CHF.   OT comments  Pt demonstrating progress toward OT goals. She was limited today by decreased activity tolerance for ADL participation but was able to participate in assessment of left upper extremity seated at EOB and simulated toilet transfers. She demonstrates significantly diminished L shoulder AROM and PROM due to pain with pain elicited at approximately 45 degrees. She was able to complete AROM at elbow and wrist and encouraged this throughout the day as well as scapular elevation/depression. Will continue to follow while admitted. Pt may benefit from further testing of L shoulder due to continued pain.    Follow Up Recommendations  Home health OT;Supervision/Assistance - 24 hour    Equipment Recommendations       Recommendations for Other Services      Precautions / Restrictions Precautions Precautions: Fall Type of Shoulder Precautions: Previous possible right clavicle fx. Per MD noted stabilize RUE and refrain from ROM. Possible left side rib fracture Precaution Booklet Issued: No Restrictions Weight Bearing Restrictions: No       Mobility Bed Mobility Overal bed mobility: Needs Assistance Bed Mobility: Supine to Sit     Supine to sit: Min guard     General bed mobility comments: Guarding assist with cues for safety.  Transfers Overall transfer level: Needs assistance Equipment used: Rolling walker (2 wheeled) Transfers: Sit to/from  Stand Sit to Stand: Min assist         General transfer comment: Min assist to power up to standing.     Balance Overall balance assessment: Needs assistance Sitting-balance support: Feet supported;Bilateral upper extremity supported Sitting balance-Leahy Scale: Good     Standing balance support: During functional activity;Bilateral upper extremity supported Standing balance-Leahy Scale: Fair                             ADL either performed or assessed with clinical judgement   ADL Overall ADL's : Needs assistance/impaired                         Toilet Transfer: Minimal assistance;Ambulation;RW Toilet Transfer Details (indicate cue type and reason): very short distance (approx 5 feet bed to chair).         Functional mobility during ADLs: Minimal assistance;Rolling walker General ADL Comments: Pt with decreased activity tolerance for ADL.     Vision       Perception     Praxis      Cognition Arousal/Alertness: Awake/alert Behavior During Therapy: WFL for tasks assessed/performed;Anxious Overall Cognitive Status: Within Functional Limits for tasks assessed Area of Impairment: Following commands;Memory                     Memory: Decreased short-term memory;Decreased recall of precautions Following Commands: Follows multi-step commands inconsistently       General Comments: Intermittently confused        Exercises Exercises: General Upper Extremity;Other exercises General Exercises - Upper Extremity Elbow Flexion: AROM;Left;5 reps Elbow  Extension: AROM;Left;5 reps Other Exercises Other Exercises: Shoulder shrugs/rolls x5  Other Exercises: Encouraged elbow and wrist movement to tolerance to decrease stiffness   Shoulder Instructions       General Comments VSS with SpO2 >98% on 4L throughout activities. RR up to 32 but improving to 22 once seated and resting. BP stable    Pertinent Vitals/ Pain       Pain Assessment:  Faces Faces Pain Scale: Hurts even more Pain Location: LUE shooting pain shoulder to elbow when PROM Pain Descriptors / Indicators: Aching;Sore Pain Intervention(s): Monitored during session;Limited activity within patient's tolerance  Home Living                                          Prior Functioning/Environment              Frequency  Min 2X/week        Progress Toward Goals  OT Goals(current goals can now be found in the care plan section)  Progress towards OT goals: Progressing toward goals  Acute Rehab OT Goals Patient Stated Goal: to go home OT Goal Formulation: With patient Time For Goal Achievement: 12/02/18 Potential to Achieve Goals: Good  Plan Discharge plan remains appropriate    Co-evaluation                 AM-PAC OT "6 Clicks" Daily Activity     Outcome Measure   Help from another person eating meals?: A Little Help from another person taking care of personal grooming?: A Little Help from another person toileting, which includes using toliet, bedpan, or urinal?: A Lot Help from another person bathing (including washing, rinsing, drying)?: A Lot Help from another person to put on and taking off regular upper body clothing?: A Little Help from another person to put on and taking off regular lower body clothing?: A Lot 6 Click Score: 15    End of Session Equipment Utilized During Treatment: Rolling walker;Oxygen  OT Visit Diagnosis: Muscle weakness (generalized) (M62.81);Unsteadiness on feet (R26.81)   Activity Tolerance Patient tolerated treatment well   Patient Left in chair;with call bell/phone within reach;with chair alarm set   Nurse Communication Mobility status        Time: 1610-9604 OT Time Calculation (min): 28 min  Charges: OT General Charges $OT Visit: 1 Visit OT Treatments $Self Care/Home Management : 23-37 mins  [place] A Fatema Rabe 11/23/2018, 1:19 PM

## 2018-11-23 NOTE — Progress Notes (Addendum)
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  Patient admitted underwent thoracentesis of the right and left lungs on 6/2 and 6/3 and scheduled for repeat 3 days later. Patient was admitted respiratory status worsened and patient placed on the BiPAP and transferred to the stepdown unit.  Patient also placed on IV diuretics.  Patient noted to spike a fever and as such also placed on IV antibiotics for presumed aspiration pneumonia.  Due to tenuous respiratory status critical care consulted. Patient underwent left-sided thoracentesis on 11/15/2018 with 400 cc of back red pleural fluid removed.  Cytology from thoracentesis of 10/28/2018+ for malignant cells consistent with breast cancer.  COVID-19 negative.  IV antibiotics narrowed down to oral Augmentin starting 11/19/2018.  Lower extremity Dopplers which were done were negative.  Patient now on intermittent BiPAP mainly at night and on 3 L nasal cannula in the daytime however still with use of accessory muscles of respiration with poor air movement.  Palliative care consulted for goals of care.  Oncology following as well.   Assessment & Plan:   Principal Problem:   Acute on chronic respiratory failure with hypoxia (HCC) Active Problems:   Shortness of breath   Hypokalemia   Anemia   Bone metastasis (HCC)   Pleural effusion, bilateral   Malignant neoplasm of overlapping sites of right breast in female, estrogen receptor positive ([place])   Morbid obesity with BMI of 40.0-44.9, adult (HCC)   Carcinoma of  breast, estrogen receptor positive, stage 4, right (HCC)   Hypoalbuminemia   Palliative care by specialist   Pleural effusion, malignant   Hypophosphatemia   Hypocalcemia   Aspiration pneumonia (HCC)   SOB (shortness of breath)  1 acute on chronic hypoxic respiratory failure Felt likely multifactorial secondary to malignant pleural effusions, metastatic stage IV breast cancer with malignant effusion, possible aspiration pneumonia, volume overload. Patient now requiring noninvasive BiPAP on and off and wearing it intermittently.  Patient noted to be refusing BiPAP overnight. Currently on 3.5 L nasal cannula.  Patient status post thoracentesis on the left 11/23/2018 with 400 cc of dark red pleural fluid removed.  Last chest x-ray done (6/24) was stable low volume chest with atelectasis and interstitial opacity.  CT chest done on 11/17/2018 with widespread metastatic disease to the bones redemonstrated with multiple nondisplaced minimally displaced bilateral rib fractures as well as new widely displaced fracture of the medial aspect of the right clavicle with probable adjacent intramuscular hemorrhage in the lower right sternocleidomastoid muscle and upper right pectoralis major muscle.  Probable tumor infiltration throughout the right breast as well as bilateral axillary and soft pectoral lymphadenopathy.  Unusual appearance of the lungs similar to prior study could suggest chronic systemic disease such as sarcoidosis or interstitial lung disease.  Possibility of lymphangitic spread of tumor in the lungs could be considered but not strongly favored.  Moderate left pleural effusion and small right pleural effusion.  Cholelithiasis.  Lower extremity Dopplers negative for DVT.  COVID-19 test negative.  Patient still with increased O2 requirements and shortness of breath with use of accessory muscles of respiration  over the past few days which is slowly improving with diuresis and antibiotics.  On IV Lasix with a  urine output of 3.6 L over the past 24 hours.  Patient is -12.343 L during this hospitalization.  Current weight of 191.14 pounds from 198.19 pounds from 204.15 pounds from 210 pounds from 233.69 pounds on admission.  Patient was empirically on IV antibiotics of cefepime and subsequently Zosyn and has been transitioned to Augmentin per PCCM.  Continue pulmonary hygiene.  Continue scheduled Xopenex and Atrovent nebs.  Decreased Lasix to 60 mg IV every 12 hours.  Strict I's and O's.  Daily weights.   2.  Malignant pleural effusion/loculated pleural effusion/right-sided inflammatory stage IV breast cancer involving lungs, lymph nodes, bones Status post thoracentesis on the left with 400 cc of dark red pleural fluid removed.  Cytology from thoracentesis 10/28/2018+ for malignant cells consistent with breast cancer.  Patient may require Pleurx catheter if recurrence of effusion however not at this time per PCCM.  Patient with extensive progressive disease.  Oncology and palliative care following.  Continue Arimidex.  Oncology planning to add palbociclib as an outpatient however per oncology if prolonged hospitalization will probably start palbociclib next week.  Per oncology.  3.  Hypocalcemia Likely secondary to recent Xgeva injection given on 11/13/2018.  Corrected calcium at 8.16.  Increased oral calcium supplementation to 3 times daily.  Patient status post IV calcium gluconate on 11/18/2018.  Patient has been ordered IV calcium gluconate 1 g IV x3 doses on 11/21/2018.  Status post IV calcium gluconate 2 g IV x1 today 11/22/2018.  We will give IV calcium gluconate 2 g IV x1 today.  Keep magnesium greater than 2.  Follow.   4.  Hypokalemia Likely secondary to diuresis.  Continue K. Dur 40 mEq p.o. daily.  Magnesium at 2.  Follow.   5.  Bilateral lower extremity rash on feet and legs Unclear etiology.  Patient with some mild petechia which is slowly improving.  May be secondary to transfusion obtained prior to  admission.  Follow for now.  6.  Probable aspiration pneumonia Patient noted to be febrile during this hospitalization.  Fever curve trended down.  MRSA PCR negative.  Patient was on IV cefepime subsequently transitioned to IV Zosyn which have been discontinued.  Patient transitioned to oral Augmentin for 3 more days which she has completed.  No further antibiotics needed.   7.  Hypophosphatemia Repleted.  8.  Microcytic anemia Patient received transfusion prior to admission.  Patient with no signs or symptoms of bleeding.  Hemoglobin at 8.6.  Follow.  Transfusion threshold hemoglobin less than 7.  Oncology following.  9.  Volume overload in the setting of blood transfusion versus suspected acute diastolic CHF Patient noted to be short of breath posttransfusion.  Patient given IV Lasix during the hospitalization and was on 40 IV every 12 hours down from 80 IV every 12 hours.  2D echo from 10/27/2018 with a EF of 60 to 24%, diastolic Doppler parameters indeterminate.  Patient with a urine output of 3.6 L over the past 24 hours.  Patient is - 12.443 L during this hospitalization.  Lasix increased to 60 mg IV every 8 hours on 11/20/2018.  Patient's current weight is 191.14 pounds from 198.19 pounds from 204.15 pounds from 210.1 pounds from 233.69 pounds on admission.  Continue Lasix to 60 mg IV every 12 hours and could likely transition to oral Lasix tomorrow.  Start Coreg 3.125 mg p.o. twice daily.  Strict  I's and O's.  Daily weights. Follow.  10.  Obesity  11.  Diarrhea Patient with loose stools which are improving after being given dose of Imodium on 11/20/2018.  Rectal pouch has been discontinued.  No further diarrhea.  Follow.  12.  Sinus tachycardia Likely secondary to volume overload and problem #1.  TSH of 4.251.  Place on low-dose Coreg.  Follow.     DVT prophylaxis: Lovenox. Code Status: Full Family Communication: Updated patient.  No family at bedside. Disposition Plan: Remain in  stepdown unit today.  High risk for decompensation.  Will likely need skilled nursing facility on discharge as patient with significant weakness.  If continued improvement could likely transfer to the floor tomorrow.  If continued improvement could likely transfer to floor tomorrow.   Consultants:   PCCM: Dr. Carson Myrtle 11/16/2018  Palliative care: Dr. Rowe Pavy 11/18/2018  Allergy: Dr. Jana Hakim 11/17/2018  Procedures:  6/02 Right Pleural Fluid Cytology >> positive for malignant cells c/w breast cancer  6/20 Left Thoracentesis >> 400 ml dark red pleural fluid removed  11/17/2018 lower extremity Dopplers Chest x-ray: 11/14/2018, 11/15/2018, 11/16/2018, 11/17/2018, 11/18/2018, 11/19/2018 CT chest 11/17/2018    Antimicrobials:  Cefepime 6/21  Zosyn 6/22-6/24 Augmentin 6/24 (for 3 days)>>>> 11/22/2018  Micro Data:  COVID 6/19 >> negative     Subjective: Patient sitting up in bed watching the news on television.  Feels shortness of breath improving daily.  Noted to have refused BiPAP overnight.  Currently on 3-1/2 L nasal cannula.  No further diarrhea.  No abdominal pain.  No chest pain..   Objective: Vitals:   11/23/18 0400 11/23/18 0500 11/23/18 0825 11/23/18 0900  BP: 127/75 (!) 143/71  134/65  Pulse: (!) 115 (!) 116  (!) 109  Resp: 15 20  15   Temp:   (!) 97.4 F (36.3 C)   TempSrc:   Oral   SpO2: 97% 95% 98% 99%  Weight:  86.7 kg    Height:        Intake/Output Summary (Last 24 hours) at 11/23/2018 0948 Last data filed at 11/23/2018 0906 Gross per 24 hour  Intake 470 ml  Output 3600 ml  Net -3130 ml   Filed Weights   11/21/18 0439 11/22/18 0500 11/23/18 0500  Weight: 92.6 kg 89.9 kg 86.7 kg    Examination:  General exam: NAD Respiratory system: Poor to fair air movement.  Some bibasilar crackles.  Some coarse breath sounds.  No wheezing noted.  No use of accessory muscles of respiration.  Speaking in full sentences.  Cardiovascular system: Tachycardia.Marland Kitchen+ JVD, murmurs, rubs,  gallops or clicks.  Trace to 1+ bilateral lower extremity edema.  Gastrointestinal system: Abdomen is soft, nontender, nondistended, positive bowel sounds.  No rebound.  No guarding.   Central nervous system: Alert and oriented. No focal neurological deficits. Extremities: Trace to 1+ bilateral lower extremity edema.  Symmetric 5 x 5 power. Skin: Slowly improving petechial rash on bilateral feet and distal lower extremities. Psychiatry: Judgement and insight appear normal. Mood & affect appropriate.     Data Reviewed: I have personally reviewed following labs and imaging studies  CBC: Recent Labs  Lab 11/18/18 0955 11/19/18 0231 11/20/18 0223 11/21/18 0222 11/22/18 0224 11/23/18 0258  WBC 7.8 8.0 9.1 8.9 9.6 10.5  NEUTROABS 5.2 5.1 5.5  --  5.1 5.7  HGB 9.0* 8.1* 8.0* 8.6* 8.5* 8.6*  HCT 27.7* 26.5* 26.7* 28.8* 28.3* 29.2*  MCV 99.6 103.9* 104.3* 106.3* 104.4* 105.8*  PLT 99* 96* 97* 97* 103*  099*   Basic Metabolic Panel: Recent Labs  Lab 11/17/18 0215 11/18/18 0955 11/19/18 0231 11/19/18 1511 11/20/18 0223 11/21/18 0222 11/22/18 0224 11/23/18 0258  NA 137 140 137 140 139 141 138 136  K 2.8* 3.4* 2.6* 3.0* 3.1* 3.4* 3.9 3.8  CL 93* 96* 92* 92* 93* 94* 94* 92*  CO2 30 27 31  36* 33* 35* 32 32  GLUCOSE 117* 115* 114* 208* 156* 131* 131* 138*  BUN 15 14 13 12 12 13 16 17   CREATININE 0.66 0.68 0.71 0.65 0.58 0.60 0.66 0.72  CALCIUM 5.2* 5.4* 5.1* 5.2* 5.1* 5.8* 6.3* 6.8*  MG 2.2 2.0 1.9  --  2.0 1.9 2.0  --   PHOS 2.3* 1.8* 1.8*  --  2.5  --   --   --    GFR: Estimated Creatinine Clearance: 77.3 mL/min (by C-G formula based on SCr of 0.72 mg/dL). Liver Function Tests: Recent Labs  Lab 11/17/18 0215 11/18/18 0955 11/19/18 0231  AST 22 22 22   ALT 16 15 14   ALKPHOS 208* 195* 194*  BILITOT 0.9 0.8 0.8  PROT 6.2* 6.3* 5.7*  ALBUMIN 2.3* 2.6* 2.3*   No results for input(s): LIPASE, AMYLASE in the last 168 hours. No results for input(s): AMMONIA in the last 168  hours. Coagulation Profile: No results for input(s): INR, PROTIME in the last 168 hours. Cardiac Enzymes: No results for input(s): CKTOTAL, CKMB, CKMBINDEX, TROPONINI in the last 168 hours. BNP (last 3 results) No results for input(s): PROBNP in the last 8760 hours. HbA1C: No results for input(s): HGBA1C in the last 72 hours. CBG: Recent Labs  Lab 11/20/18 2121 11/22/18 1639  GLUCAP 140* 161*   Lipid Profile: No results for input(s): CHOL, HDL, LDLCALC, TRIG, CHOLHDL, LDLDIRECT in the last 72 hours. Thyroid Function Tests: No results for input(s): TSH, T4TOTAL, FREET4, T3FREE, THYROIDAB in the last 72 hours. Anemia Panel: No results for input(s): VITAMINB12, FOLATE, FERRITIN, TIBC, IRON, RETICCTPCT in the last 72 hours. Sepsis Labs: Recent Labs  Lab 11/16/18 1158 11/17/18 0215 11/18/18 0212  PROCALCITON  --  2.30 1.69  LATICACIDVEN 1.3  --   --     Recent Results (from the past 240 hour(s))  SARS Coronavirus 2 (CEPHEID - Performed in [place] hospital lab), Hosp Order     Status: None   Collection Time: 11/14/18  4:34 PM   Specimen: Nasopharyngeal Swab  Result Value Ref Range Status   SARS Coronavirus 2 NEGATIVE NEGATIVE Final    Comment: (NOTE) If result is NEGATIVE SARS-CoV-2 target nucleic acids are NOT DETECTED. The SARS-CoV-2 RNA is generally detectable in upper and lower  respiratory specimens during the acute phase of infection. The lowest  concentration of SARS-CoV-2 viral copies this assay can detect is 250  copies / mL. A negative result does not preclude SARS-CoV-2 infection  and should not be used as the sole basis for treatment or other  patient management decisions.  A negative result may occur with  improper specimen collection / handling, submission of specimen other  than nasopharyngeal swab, presence of viral mutation(s) within the  areas targeted by this assay, and inadequate number of viral copies  (<250 copies / mL). A negative result must be  combined with clinical  observations, patient history, and epidemiological information. If result is POSITIVE SARS-CoV-2 target nucleic acids are DETECTED. The SARS-CoV-2 RNA is generally detectable in upper and lower  respiratory specimens dur ing the acute phase of infection.  Positive  results are  indicative of active infection with SARS-CoV-2.  Clinical  correlation with patient history and other diagnostic information is  necessary to determine patient infection status.  Positive results do  not rule out bacterial infection or co-infection with other viruses. If result is PRESUMPTIVE POSTIVE SARS-CoV-2 nucleic acids MAY BE PRESENT.   A presumptive positive result was obtained on the submitted specimen  and confirmed on repeat testing.  While 2019 novel coronavirus  (SARS-CoV-2) nucleic acids may be present in the submitted sample  additional confirmatory testing may be necessary for epidemiological  and / or clinical management purposes  to differentiate between  SARS-CoV-2 and other Sarbecovirus currently known to infect humans.  If clinically indicated additional testing with an alternate test  methodology [phone]) is advised. The SARS-CoV-2 RNA is generally  detectable in upper and lower respiratory sp ecimens during the acute  phase of infection. The expected result is Negative. Fact Sheet for Patients:  StrictlyIdeas.no Fact Sheet for Healthcare Providers: BankingDealers.co.za This test is not yet approved or cleared by the [place] FDA and has been authorized for detection and/or diagnosis of SARS-CoV-2 by FDA under an Emergency Use Authorization (EUA).  This EUA will remain in effect (meaning this test can be used) for the duration of the COVID-19 declaration under Section 564(b)(1) of the Act, 21 U.S.C. section 360bbb-3(b)(1), unless the authorization is terminated or revoked sooner. Performed at [facility], [place] [address], [place], [place] [address]   MRSA PCR Screening     Status: None   Collection Time: 11/16/18  8:02 AM   Specimen: Nasal Mucosa; Nasopharyngeal  Result Value Ref Range Status   MRSA by PCR NEGATIVE NEGATIVE Final    Comment:        The GeneXpert MRSA Assay (FDA approved for NASAL specimens only), is one component of a comprehensive MRSA colonization surveillance program. It is not intended to diagnose MRSA infection nor to guide or monitor treatment for MRSA infections. Performed at [facility], [place] [address], [place], [place] [address]          Radiology Studies: No results found.      Scheduled Meds:  anastrozole  1 mg Oral Daily   calcium-vitamin D  1 tablet Oral TID   carvedilol  3.125 mg Oral BID WC   Chlorhexidine Gluconate Cloth  6 each Topical Daily   enoxaparin (LOVENOX) injection  30 mg Subcutaneous Daily   feeding supplement (ENSURE ENLIVE)  237 mL Oral BID BM   furosemide  60 mg Intravenous Q12H   ipratropium  0.5 mg Nebulization TID   levalbuterol  0.63 mg Nebulization TID   mouth rinse  15 mL Mouth Rinse q12n4p   potassium chloride  40 mEq Oral Daily   Continuous Infusions:  sodium chloride Stopped (11/16/18 1407)     LOS: 9 days    Time spent: 45 minutes    Irine Seal, MD [facility]  If 7PM-7AM, please contact night-coverage [email or web address] 11/23/2018, 9:48 AM

## 2018-11-23 NOTE — Progress Notes (Signed)
Bipap not indicated at this time.  Will continue to monitor for distress.

## 2018-11-24 DIAGNOSIS — E8770 Fluid overload, unspecified: Secondary | ICD-10-CM

## 2018-11-24 DIAGNOSIS — I5033 Acute on chronic diastolic (congestive) heart failure: Secondary | ICD-10-CM

## 2018-11-24 DIAGNOSIS — L8990 Pressure ulcer of unspecified site, unspecified stage: Secondary | ICD-10-CM | POA: Insufficient documentation

## 2018-11-24 LAB — CBC WITH DIFFERENTIAL/PLATELET
Abs Immature Granulocytes: 0.54 10*3/uL — ABNORMAL HIGH (ref 0.00–0.07)
Basophils Absolute: 0.1 10*3/uL (ref 0.0–0.1)
Basophils Relative: 1 %
Eosinophils Absolute: 0.2 10*3/uL (ref 0.0–0.5)
Eosinophils Relative: 2 %
HCT: 27.4 % — ABNORMAL LOW (ref 36.0–46.0)
Hemoglobin: 8.2 g/dL — ABNORMAL LOW (ref 12.0–15.0)
Immature Granulocytes: 5 %
Lymphocytes Relative: 32 %
Lymphs Abs: 3.3 10*3/uL (ref 0.7–4.0)
MCH: 31.3 pg (ref 26.0–34.0)
MCHC: 29.9 g/dL — ABNORMAL LOW (ref 30.0–36.0)
MCV: 104.6 fL — ABNORMAL HIGH (ref 80.0–100.0)
Monocytes Absolute: 0.4 10*3/uL (ref 0.1–1.0)
Monocytes Relative: 4 %
Neutro Abs: 5.9 10*3/uL (ref 1.7–7.7)
Neutrophils Relative %: 56 %
Platelets: 110 10*3/uL — ABNORMAL LOW (ref 150–400)
RBC: 2.62 MIL/uL — ABNORMAL LOW (ref 3.87–5.11)
RDW: 17 % — ABNORMAL HIGH (ref 11.5–15.5)
WBC: 10.4 10*3/uL (ref 4.0–10.5)
nRBC: 1.1 % — ABNORMAL HIGH (ref 0.0–0.2)

## 2018-11-24 LAB — BASIC METABOLIC PANEL
Anion gap: 13 (ref 5–15)
BUN: 21 mg/dL — ABNORMAL HIGH (ref 6–20)
CO2: 29 mmol/L (ref 22–32)
Calcium: 6.7 mg/dL — ABNORMAL LOW (ref 8.9–10.3)
Chloride: 92 mmol/L — ABNORMAL LOW (ref 98–111)
Creatinine, Ser: 0.75 mg/dL (ref 0.44–1.00)
GFR calc Af Amer: >60 mL/min (ref >60)
GFR calc non Af Amer: >60 mL/min (ref >60)
Glucose, Bld: 133 mg/dL — ABNORMAL HIGH (ref 70–99)
Potassium: 3.9 mmol/L (ref 3.5–5.1)
Sodium: 134 mmol/L — ABNORMAL LOW (ref 135–145)

## 2018-11-24 MED ORDER — PALBOCICLIB 125 MG PO CAPS: 125.0000 mg | ORAL_CAPSULE | Freq: Every day | ORAL | Status: DC

## 2018-11-24 MED ORDER — SENNOSIDES-DOCUSATE SODIUM 8.6-50 MG PO TABS
1.0000 | ORAL_TABLET | Freq: Every day | ORAL | Status: DC
  Administered 2018-11-24 – 2018-11-30 (×7): 1 via ORAL
  Filled 2018-11-24 (×7): qty 1

## 2018-11-24 MED ORDER — FUROSEMIDE 40 MG PO TABS
40.0000 mg | ORAL_TABLET | Freq: Every day | ORAL | Status: DC
  Administered 2018-11-25: 40 mg via ORAL
  Filled 2018-11-24: qty 1

## 2018-11-24 MED ORDER — ENOXAPARIN SODIUM 40 MG/0.4ML ~~LOC~~ SOLN
40.0000 mg | Freq: Every day | SUBCUTANEOUS | Status: DC
  Administered 2018-11-25 – 2018-12-01 (×7): 40 mg via SUBCUTANEOUS
  Filled 2018-11-24 (×7): qty 0.4

## 2018-11-24 MED ORDER — CALCIUM GLUCONATE-NACL 2-0.675 GM/100ML-% IV SOLN
2.0000 g | Freq: Once | INTRAVENOUS | Status: AC
  Administered 2018-11-24: 2000 mg via INTRAVENOUS
  Filled 2018-11-24: qty 100

## 2018-11-24 MED ORDER — ENSURE ENLIVE PO LIQD
237.0000 mL | Freq: Three times a day (TID) | ORAL | Status: DC
  Administered 2018-11-24 – 2018-12-01 (×12): 237 mL via ORAL

## 2018-11-24 MED ORDER — CARVEDILOL 6.25 MG PO TABS
6.2500 mg | ORAL_TABLET | Freq: Two times a day (BID) | ORAL | Status: DC
  Administered 2018-11-24 – 2018-12-01 (×14): 6.25 mg via ORAL
  Filled 2018-11-24 (×14): qty 1

## 2018-11-24 MED ORDER — PALBOCICLIB 125 MG PO CAPS
125.0000 mg | ORAL_CAPSULE | Freq: Every day | ORAL | Status: DC
  Administered 2018-11-24 – 2018-12-01 (×7): 125 mg via ORAL
  Filled 2018-11-24: qty 1

## 2018-11-24 NOTE — Progress Notes (Addendum)
HEMATOLOGY-ONCOLOGY PROGRESS NOTE  SUBJECTIVE: Comfortable this morning. Breathing is better. Has no chest discomfort or other complaints this morning.   REVIEW OF SYSTEMS:   Constitutional: Denies fevers, chills Eyes: Denies blurriness of vision Ears, nose, mouth, throat, and face: Denies mucositis or sore throat Respiratory: Reports shortness of breath, somewhat improved Cardiovascular: Denies palpitation, chest discomfort Gastrointestinal:  Denies nausea, heartburn or change in bowel habits Skin: Has rash to bilateral feet.  Lymphatics: Denies new lymphadenopathy or easy bruising Neurological:Denies numbness, tingling or new weaknesses Behavioral/Psych: Reports being tearful.   Extremities: Has swelling and discoloration in her LE. All other systems were reviewed with the patient and are negative.  I have reviewed the past medical history, past surgical history, social history and family history with the patient and they are unchanged from previous note.   PHYSICAL EXAMINATION:  Vitals:   11/24/18 0816 11/24/18 0900  BP:  124/77  Pulse: (!) 105 (!) 102  Resp: 20 19  Temp:    SpO2: 96% 99%   Filed Weights   11/22/18 0500 11/23/18 0500 11/24/18 0500  Weight: 198 lb 3.1 oz (89.9 kg) 191 lb 2.2 oz (86.7 kg) 189 lb 9.5 oz (86 kg)    Intake/Output from previous day: 06/28 0701 - 06/29 0700 In: 130 [P.O.:30; IV Piggyback:100] Out: 4097 [Urine:1820]  GENERAL:alert, distress SKIN: Discoloration of her bilateral feet; improving EYES: normal, Conjunctiva are pink and non-injected, sclera clear OROPHARYNX:no exudate, no erythema and lips, buccal mucosa, and tongue normal  NECK: supple, thyroid normal size, non-tender, without nodularity LYMPH:  no palpable lymphadenopathy in the cervical, axillary or inguinal LUNGS: Clear HEART: regular rate & rhythm and no murmurs.  trace bilateral lower extremity edema. ABDOMEN:abdomen soft, non-tender and normal bowel  sounds Musculoskeletal:no cyanosis of digits and no clubbing  NEURO: alert & oriented x 3 with fluent speech, no focal motor/sensory deficits. Anxious and tearful at times.  LABORATORY DATA:  I have reviewed the data as listed CMP Latest Ref Rng & Units 11/24/2018 11/23/2018 11/22/2018  Glucose 70 - 99 mg/dL 133(H) 138(H) 131(H)  BUN 6 - 20 mg/dL 21(H) 17 16  Creatinine 0.44 - 1.00 mg/dL 0.75 0.72 0.66  Sodium 135 - 145 mmol/L 134(L) 136 138  Potassium 3.5 - 5.1 mmol/L 3.9 3.8 3.9  Chloride 98 - 111 mmol/L 92(L) 92(L) 94(L)  CO2 22 - 32 mmol/L 29 32 32  Calcium 8.9 - 10.3 mg/dL 6.7(L) 6.8(L) 6.3(LL)  Total Protein 6.5 - 8.1 g/dL - - -  Total Bilirubin 0.3 - 1.2 mg/dL - - -  Alkaline Phos 38 - 126 U/L - - -  AST 15 - 41 U/L - - -  ALT 0 - 44 U/L - - -    Lab Results  Component Value Date   WBC 10.4 11/24/2018   HGB 8.2 (L) 11/24/2018   HCT 27.4 (L) 11/24/2018   MCV 104.6 (H) 11/24/2018   PLT 110 (L) 11/24/2018   NEUTROABS 5.9 11/24/2018    Dg Chest 1 View  Result Date: 11/15/2018 CLINICAL DATA:  Post left-sided thoracentesis. EXAM: CHEST  1 VIEW COMPARISON:  11/14/2018 FINDINGS: Lungs are hypoinflated demonstrate interval improvement with moderate residual left-sided pleural effusion likely with associated basilar atelectasis. No pneumothorax. Mild prominence of the perihilar markings without significant change. Cardiomediastinal silhouette and remainder of the exam is unchanged. IMPRESSION: Hypoinflation with interval improvement in left pleural effusion post thoracentesis. No pneumothorax. Moderate residual left effusion likely with associated basilar atelectasis. Prominence of the perihilar markings suggesting  vascular congestion. Electronically Signed   By: Marin Olp M.D.   On: 11/15/2018 13:58   Dg Chest 1 View  Result Date: 10/29/2018 CLINICAL DATA:  Post LEFT thoracentesis EXAM: CHEST  1 VIEW COMPARISON:  622 in 20 FINDINGS: Upper normal heart size. Stable mediastinal  contours. BILATERAL upper lobe scarring. Improved aeration at bases since prior study. Minimal residual LEFT pleural effusion without pneumothorax. Bones demineralized. IMPRESSION: Extensive chronic lung changes. No pneumothorax following LEFT thoracentesis. Electronically Signed   By: Lavonia Dana M.D.   On: 10/29/2018 15:50   Dg Chest 1 View  Result Date: 10/28/2018 CLINICAL DATA:  Post right thoracentesis EXAM: CHEST  1 VIEW COMPARISON:  10/28/2018 FINDINGS: Very low lung volumes again noted. Interstitial prominence within the lungs. Heart is borderline in size with vascular congestion. No pneumothorax following right thoracentesis. Diffuse skeletal metastases again noted. IMPRESSION: Very low lung volumes with cardiomegaly, vascular congestion and interstitial prominence. No pneumothorax following thoracentesis. Electronically Signed   By: Rolm Baptise M.D.   On: 10/28/2018 16:00   Dg Chest 2 View  Result Date: 11/14/2018 CLINICAL DATA:  Stage IV breast cancer.  Shortness of breath. EXAM: CHEST - 2 VIEW COMPARISON:  Radiographs 11/13/2018 and 10/29/2018.  CT 10/26/2018. FINDINGS: The heart size and mediastinal contours are stable. Left pleural effusion and left basilar opacity are unchanged from the recent study. Diffusely increased interstitial markings are again noted throughout both lungs, remaining suspicious for lymphangitic spread of tumor based on previous CT. No progressive airspace opacities identified. Diffuse osseous metastatic disease noted without evidence of acute fracture. IMPRESSION: Stable radiographic appearance of the chest since yesterday. A left pleural effusion and left basilar pulmonary opacity have increased from older prior studies. Evidence of extensive metastatic disease again noted. Electronically Signed   By: Richardean Sale M.D.   On: 11/14/2018 17:10   Dg Chest 2 View  Result Date: 11/13/2018 CLINICAL DATA:  Shortness of breath, weakness. Cancer patient. EXAM: CHEST - 2  VIEW COMPARISON:  Chest x-ray dated 10/29/2018. Chest CT dated 10/26/2018. FINDINGS: Heart size and mediastinal contours appear stable. Increased opacity at the LEFT lung base, likely increased pleural effusion. There is also central pulmonary vascular congestion and bilateral perihilar edema. No pneumothorax seen. Diffuse osseous metastases, as described on previous CT report. IMPRESSION: 1. Increased opacity at the LEFT lung base, likely increased pleural effusion. Superimposed pneumonia or airspace collapse not excluded. 2. Central pulmonary vascular congestion and bilateral perihilar edema indicating CHF/volume overload. 3. Diffuse osseous metastases. Electronically Signed   By: Franki Cabot M.D.   On: 11/13/2018 12:32   Ct Head W & Wo Contrast  Result Date: 10/30/2018 CLINICAL DATA:  Metastatic breast cancer. No neurologic symptoms are reported. EXAM: CT HEAD WITHOUT AND WITH CONTRAST TECHNIQUE: Contiguous axial images were obtained from the base of the skull through the vertex without and with intravenous contrast CONTRAST:  67m ISOVUE-300 IOPAMIDOL (ISOVUE-300) INJECTION 61% COMPARISON:  Nuclear medicine whole-body bone scan, 10/27/2018 FINDINGS: Brain: No acute stroke, hemorrhage, mass lesion or hydrocephalus. Mild atrophy. No extra-axial fluid. Post infusion, no abnormal enhancement of the brain or visible meninges. Vascular: No hyperdense vessel or unexpected calcification. Visible vessels are patent. Skull: Widespread metastatic disease to the calvarium, skull base, and visible cervical spine. Metastatic disease to the mandible is also present. There is destruction of the inner and outer table of the skull in multiple locations. Epidural tumor is not established. Sinuses/Orbits: No acute finding. Other: None. IMPRESSION: 1. Widespread osseous metastatic disease  to the calvarium, skull base, and visible cervical spine. Epidural tumor not established. 2. No intracranial metastatic disease is evident.  Electronically Signed   By: Staci Righter M.D.   On: 10/30/2018 09:50   Ct Chest Wo Contrast  Result Date: 11/17/2018 CLINICAL DATA:  58 year old female Caucasian female nonsmoker with history of bilateral lower extremity edema. Possible stage IV breast cancer. EXAM: CT CHEST WITHOUT CONTRAST TECHNIQUE: Multidetector CT imaging of the chest was performed following the standard protocol without IV contrast. COMPARISON:  Chest CT 10/26/2018. FINDINGS: Cardiovascular: Heart size is normal. There is no significant pericardial fluid, thickening or pericardial calcification. There is aortic atherosclerosis, as well as atherosclerosis of the great vessels of the mediastinum and the coronary arteries, including calcified atherosclerotic plaque in the left anterior descending and right coronary arteries. Mediastinum/Nodes: No pathologically enlarged mediastinal or hilar lymph nodes. Please note that accurate exclusion of hilar adenopathy is limited on noncontrast CT scans. Esophagus is unremarkable in appearance. Multiple borderline enlarged and mildly enlarged bilateral axillary and subpectoral lymph nodes (right greater than left) measuring up to 15 mm in short axis on the right (axial image 49 of series 2). Lungs/Pleura: Extensive septal thickening with band like opacities throughout both lungs, similar to the prior study. No discrete pulmonary nodules or masses are confidently identified. Moderate left and small right pleural effusions. The left pleural effusion is predominantly sub pulmonic in position. Upper Abdomen: Several peripherally calcified gallstones lying dependently in the gallbladder measuring up to 1.9 cm in diameter. Aortic atherosclerosis. Musculoskeletal: Right breast is incompletely imaged, but there appears to be irregular soft tissue infiltration throughout much of the right breast tissue, which could reflect tumoral in for tray shin and/or edema. Diffuse lytic and sclerotic lesions throughout the  visualized axial and appendicular skeleton, indicative of widespread metastatic disease to the bones. With appears very similar to the prior examination. There continue to be multiple nondisplaced to minimally displaced pathologic fractures throughout the ribs bilaterally. In addition, there is a new widely displaced fracture of the medial aspect of the right clavicle with approximately 1.4 cm of lateral and anterior displacement, as well as high attenuation thickening of the adjacent musculature in the upper right pectoralis major and lower right sternocleidomastoid muscles, most compatible with intramuscular hematoma and/or tumor. IMPRESSION: 1. Widespread metastatic disease to the bones redemonstrated, with multiple nondisplaced minimally displaced bilateral rib fractures, as well as a new widely displaced fracture of the medial aspect of the right clavicle with probable adjacent intramuscular hemorrhage in the lower right sternocleidomastoid muscle and upper right pectoralis major muscle. Probable tumoral infiltration throughout the right breast, as well as bilateral axillary and subpectoral lymphadenopathy (right greater than left). 2. Unusual appearance of the lungs, similar to the prior study, which could suggest chronic systemic disease such as sarcoidosis or interstitial lung disease. The possibility of lymphangitic spread of tumor in the lungs could be considered, but is not strongly favored at this time. 3. Moderate left (predominantly subpulmonic) pleural effusion and small right pleural effusion. 4. Cholelithiasis. 5. Aortic atherosclerosis, in addition to 2 vessel coronary artery disease. Aortic Atherosclerosis (ICD10-I70.0). Electronically Signed   By: Vinnie Langton M.D.   On: 11/17/2018 14:55   Ct Chest W Contrast  Result Date: 10/26/2018 CLINICAL DATA:  Shortness of breath, possible metastatic disease. Breast cancer. EXAM: CT CHEST WITH CONTRAST TECHNIQUE: Multidetector CT imaging of the  chest was performed during intravenous contrast administration. CONTRAST:  81m OMNIPAQUE IOHEXOL 300 MG/ML  SOLN COMPARISON:  Chest  x-ray earlier today FINDINGS: Cardiovascular: Mild cardiomegaly. Moderate coronary artery calcifications in the left anterior descending coronary artery. Aorta is normal caliber. Scattered atherosclerotic change. Mediastinum/Nodes: Numerous borderline sized and mildly enlarged mediastinal lymph nodes. Right paratracheal node measures 12 mm. Borderline size bilateral hilar lymph nodes, the largest on the right measuring 11 mm in short axis diameter. Numerous enlarged bilateral axillary lymph nodes with index left axillary lymph node having a short axis diameter of 2.1 cm on image 21 and right axillary lymph node having a short axis diameter of 1.4 cm on image 48. Lungs/Pleura: Moderate bilateral pleural effusions. Extensive interstitial disease throughout the lungs bilaterally. Mild reticulonodular densities. Cannot exclude lymphangitic spread of tumor. Upper Abdomen: Gallstones noted within the gallbladder. Musculoskeletal: Chest wall soft tissues are unremarkable. Extensive diffuse mixed lytic and sclerotic lesions throughout the visualized skeleton compatible with diffuse skeletal metastases. IMPRESSION: Bilateral axillary and mediastinal adenopathy. Borderline hilar lymph nodes bilaterally. Findings likely reflect metastatic lymphadenopathy. Moderate bilateral pleural effusions. Extensive interstitial thickening and reticulonodular opacities throughout the lungs. Cannot exclude lymphangitic spread of tumor. Diffuse skeletal metastases. Cholelithiasis. Coronary artery disease. Aortic Atherosclerosis (ICD10-I70.0). Electronically Signed   By: Rolm Baptise M.D.   On: 10/26/2018 22:54   Nm Bone Scan Whole Body  Result Date: 10/27/2018 CLINICAL DATA:  Breast cancer staging. EXAM: NUCLEAR MEDICINE WHOLE BODY BONE SCAN TECHNIQUE: Whole body anterior and posterior images were obtained  approximately 3 hours after intravenous injection of radiopharmaceutical. RADIOPHARMACEUTICALS:  20.6 mCi Technetium-3mMDP IV COMPARISON:  CT scan Oct 26, 2018 FINDINGS: Bony metastatic disease is seen in both of the femurs, numerous ribs, the manubrium, and right humerus. The widespread bony metastatic disease in the scapula, thoracic spine, sternum, and proximal humeri is better appreciated on the comparison CT scan. IMPRESSION: Widespread bony metastatic disease. The extent of metastatic disease is actually better appreciated on the recent CT imaging. This study is a near super scan consistent with widespread bony metastatic disease. Electronically Signed   By: DDorise BullionIII M.D   On: 10/27/2018 16:01   Dg Chest Port 1 View  Result Date: 11/20/2018 CLINICAL DATA:  Hypoxia, metastatic breast cancer EXAM: PORTABLE CHEST 1 VIEW COMPARISON:  Portable exam 1200 hours compared to 11/19/2018 FINDINGS: Very low lung volumes. Stable heart size mediastinal contours. BILATERAL pulmonary opacities again identified, unchanged. No new infiltrate, pleural effusion or pneumothorax. Scattered areas of osseous sclerosis consistent with osseous metastatic disease IMPRESSION: Widespread osseous metastatic disease. Persistent pulmonary opacities. Electronically Signed   By: MLavonia DanaM.D.   On: 11/20/2018 14:06   Dg Chest Port 1 View  Result Date: 11/19/2018 CLINICAL DATA:  Shortness of breath EXAM: PORTABLE CHEST 1 VIEW COMPARISON:  Yesterday FINDINGS: Very low volume chest with patchy bilateral infiltrate greater on the right. Some of these are streaky and suggestive of scarring or atelectasis. No visible effusion or pneumothorax. Largely obscured heart which is not enlarged by recent CT. Extensive osseous metastatic disease IMPRESSION: Stable low volume chest with atelectasis and interstitial opacity, reference chest CT from 2 days ago. Electronically Signed   By: JMonte FantasiaM.D.   On: 11/19/2018 08:54    Dg Chest Port 1 View  Result Date: 11/18/2018 CLINICAL DATA:  Shortness of breath and increased weakness this morning; history metastatic breast cancer EXAM: PORTABLE CHEST 1 VIEW COMPARISON:  Portable exam 0823 hours compared to 11/17/2018 FINDINGS: Very low lung volumes. Normal heart size mediastinal contours. Patchy opacities in the lungs bilaterally favoring pneumonia over atelectasis. No pleural  effusion or pneumothorax. Patchy osteosclerosis consistent with osseous metastatic disease. IMPRESSION: BILATERAL pulmonary opacities likely representing multifocal pneumonia. Electronically Signed   By: Lavonia Dana M.D.   On: 11/18/2018 09:30   Dg Chest Port 1 View  Result Date: 11/17/2018 CLINICAL DATA:  Shortness of breath.  Metastatic breast cancer. EXAM: PORTABLE CHEST 1 VIEW COMPARISON:  11/16/2018.  CT 10/26/2018. FINDINGS: Mediastinum stable. Heart size normal. Persistent left base atelectasis infiltrate. Right base atelectasis/infiltrate also noted on today's exam. Diffuse bilateral interstitial prominence again noted. Tiny left pleural effusion again noted. No pneumothorax. Multiple lytic lesions noted throughout the visualized bony structures. IMPRESSION: 1. Persistent left base atelectasis/infiltrate. Right base atelectasis/infiltrate also noted on today's exam. Small left pleural effusion again noted. 2. Diffuse bilateral interstitial prominence again noted consistent with interstitial tumor spread this patient with known metastatic disease. 3. Multiple lytic lesions are again noted throughout the visualized bony structures consistent with metastatic disease. Electronically Signed   By: Marcello Moores  Register   On: 11/17/2018 06:24   Dg Chest Port 1 View  Result Date: 11/16/2018 CLINICAL DATA:  Dyspnea, metastatic breast cancer EXAM: PORTABLE CHEST 1 VIEW COMPARISON:  Chest radiograph from one day prior. FINDINGS: Low lung volumes. Stable cardiomediastinal silhouette with mild cardiomegaly. No  pneumothorax. Stable small bilateral pleural effusions. Stable patchy left lung base consolidation. Stable thick irregular reticular opacities in the upper lungs. IMPRESSION: 1. Stable low lung volumes with small bilateral pleural effusions. 2. Stable thick irregular reticular opacities in the upper lungs and patchy left lung base consolidation. Findings likely representing combination of lymphangitic tumor (as seen on recent chest CT) and atelectasis. A component of mild pulmonary edema cannot be excluded. Electronically Signed   By: Ilona Sorrel M.D.   On: 11/16/2018 07:13   Dg Chest Port 1 View  Result Date: 10/28/2018 CLINICAL DATA:  Bone injury EXAM: PORTABLE CHEST 1 VIEW COMPARISON:  10/26/2018 FINDINGS: Diffuse widespread metastatic disease is again noted involving the osseous structures. There is an acute appearing fracture involving the fourth rib posterolaterally on the left. There are likely additional left-sided rib fractures that are suboptimally evaluated on this exam. The lung volumes are low. The cardiac silhouette is enlarged. There are growing bilateral pleural effusions and prominent bilateral interstitial lung markings. Bibasilar airspace opacities are noted. IMPRESSION: 1. Acute left-sided rib fracture involving the fourth rib posterolaterally. No pneumothorax. There are likely additional adjacent fractures that are suboptimally evaluated on this exam. 2. Low lung volumes. 3. Cardiomegaly with growing bilateral pleural effusions and prominent interstitial lung markings suggestive of progressive pulmonary edema. Electronically Signed   By: Constance Holster M.D.   On: 10/28/2018 01:55   Dg Chest Portable 1 View  Result Date: 10/26/2018 CLINICAL DATA:  Shortness of breath, possible right breast cancer EXAM: PORTABLE CHEST 1 VIEW COMPARISON:  None. FINDINGS: Low lung volumes. No pleural effusion. Diffuse interstitial opacity. Heart size upper limits of normal. No pneumothorax. Diffuse  sclerosis and lucent lesions within the clavicles, ribs and shoulders. IMPRESSION: 1. Low lung volumes without consolidation or effusion. Diffuse bilateral interstitial opacity of unknown chronicity. Findings could be secondary to interstitial inflammatory process, atypical/viral pneumonia, or possible metastatic disease given clinical history. CT chest suggested for further evaluation 2. 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   On: 10/26/2018 20:11   Dg Abd 2 Views  Result Date: 11/13/2018 CLINICAL DATA:  Pt c/o sob,  weakness, cancer patient. EXAM: ABDOMEN - 2 VIEW COMPARISON:  None. FINDINGS: Overall visualized bowel gas pattern is nonobstructive. Moderate amount of stool and gas throughout the colon. No evidence of free intraperitoneal air seen. Diffuse osseous metastases, as previously described. IMPRESSION: 1. Nonobstructive bowel gas pattern. Moderate amount of stool and gas throughout the colon. 2. Diffuse osseous metastases. Electronically Signed   By: Franki Cabot M.D.   On: 11/13/2018 12:33   Vas [place] Lower Extremity Venous (dvt)  Result Date: 11/18/2018  Lower Venous Study Indications: Swelling.  Risk Factors: Cancer. Limitations: Body habitus and poor ultrasound/tissue interface. Comparison Study: No prior studies. Performing Technologist: Oliver Hum RVT  Examination Guidelines: A complete evaluation includes B-mode imaging, spectral Doppler, color Doppler, and power Doppler as needed of all accessible portions of each vessel. Bilateral testing is considered an integral part of a complete examination. Limited examinations for reoccurring indications may be performed as noted.  +---------+---------------+---------+-----------+----------+-------+ RIGHT    CompressibilityPhasicitySpontaneityPropertiesSummary  +---------+---------------+---------+-----------+----------+-------+ CFV      Full           Yes      Yes                          +---------+---------------+---------+-----------+----------+-------+ SFJ      Full                                                 +---------+---------------+---------+-----------+----------+-------+ FV Prox  Full                                                 +---------+---------------+---------+-----------+----------+-------+ FV Mid   Full                                                 +---------+---------------+---------+-----------+----------+-------+ FV DistalFull                                                 +---------+---------------+---------+-----------+----------+-------+ PFV      Full                                                 +---------+---------------+---------+-----------+----------+-------+ POP      Full           Yes      Yes                          +---------+---------------+---------+-----------+----------+-------+ PTV      Full                                                 +---------+---------------+---------+-----------+----------+-------+ PERO     Full                                                 +---------+---------------+---------+-----------+----------+-------+   +---------+---------------+---------+-----------+----------+-------+  LEFT     CompressibilityPhasicitySpontaneityPropertiesSummary +---------+---------------+---------+-----------+----------+-------+ CFV      Full           Yes      Yes                          +---------+---------------+---------+-----------+----------+-------+ SFJ      Full                                                 +---------+---------------+---------+-----------+----------+-------+ FV Prox  Full                                                 +---------+---------------+---------+-----------+----------+-------+ FV Mid   Full                                                  +---------+---------------+---------+-----------+----------+-------+ FV DistalFull                                                 +---------+---------------+---------+-----------+----------+-------+ PFV      Full                                                 +---------+---------------+---------+-----------+----------+-------+ POP      Full           Yes      Yes                          +---------+---------------+---------+-----------+----------+-------+ PTV      Full                                                 +---------+---------------+---------+-----------+----------+-------+ PERO     Full                                                 +---------+---------------+---------+-----------+----------+-------+     Summary: Right: There is no evidence of deep vein thrombosis in the lower extremity. No cystic structure found in the popliteal fossa. Left: There is no evidence of deep vein thrombosis in the lower extremity. No cystic structure found in the popliteal fossa.  *See table(s) above for measurements and observations. Electronically signed by Deitra Mayo MD on 11/18/2018 at 11:42:50 AM.    Final    Ir Thoracentesis Asp Pleural Space W/img Guide  Result Date: 10/29/2018 INDICATION: Patient with history of metastatic disease on imaging, dyspnea, and bilateral pleural effusions. Request is made for therapeutic left thoracentesis. EXAM: ULTRASOUND GUIDED THERAPEUTIC LEFT THORACENTESIS  MEDICATIONS: 10 mL 1% lidocaine COMPLICATIONS: None immediate. PROCEDURE: An ultrasound guided thoracentesis was thoroughly discussed with the patient and questions answered. The benefits, risks, alternatives and complications were also discussed. The patient understands and wishes to proceed with the procedure. Written consent was obtained. Ultrasound was performed to localize and mark an adequate pocket of fluid in the left chest.  The area was then prepped and draped in the normal sterile fashion. 1% Lidocaine was used for local anesthesia. Under ultrasound guidance a 6 Fr Safe-T-Centesis catheter was introduced. Thoracentesis was performed. The catheter was removed and a dressing applied. FINDINGS: A total of approximately 350 mL of hazy gold fluid was removed. IMPRESSION: Successful ultrasound guided left thoracentesis yielding 350 mL of pleural fluid. Read by: Earley Abide, PA-C Electronically Signed   By: Jerilynn Mages.  Shick M.D.   On: 10/29/2018 15:58   Ir Thoracentesis Asp Pleural Space W/img Guide  Result Date: 10/28/2018 INDICATION: Patient with history of metastatic disease (including bone metastases) on imaging, dyspnea, and bilateral pleural effusions (R>L). Request is made for diagnostic and therapeutic right thoracentesis. EXAM: ULTRASOUND GUIDED DIAGNOSTIC AND THERAPEUTIC RIGHT THORACENTESIS MEDICATIONS: 10 mL 1% lidocaine COMPLICATIONS: None immediate. PROCEDURE: An ultrasound guided thoracentesis was thoroughly discussed with the patient and questions answered. The benefits, risks, alternatives and complications were also discussed. The patient understands and wishes to proceed with the procedure. Written consent was obtained. Ultrasound was performed to localize and mark an adequate pocket of fluid in the right chest. The area was then prepped and draped in the normal sterile fashion. 1% Lidocaine was used for local anesthesia. Under ultrasound guidance a 6 Fr Safe-T-Centesis catheter was introduced. Thoracentesis was performed. The catheter was removed and a dressing applied. FINDINGS: A total of approximately 600 mL of clear gold fluid was removed. Samples were sent to the laboratory as requested by the clinical team. IMPRESSION: Successful ultrasound guided right thoracentesis yielding 600 mL of pleural fluid. Read by: Earley Abide, PA-C Electronically Signed   By: Jerilynn Mages.  Shick M.D.   On: 10/28/2018 16:10   US Thoracentesis  Asp Pleural Space W/img Guide  Result Date: 11/15/2018 INDICATION: Patient with history of metastatic breast cancer, dyspnea, bilateral pleural effusions L>R. Request is made for therapeutic left thoracentesis. EXAM: ULTRASOUND GUIDED THERAPEUTIC LEFT THORACENTESIS MEDICATIONS: 10 mL 1% lidocaine COMPLICATIONS: None immediate. PROCEDURE: An ultrasound guided thoracentesis was thoroughly discussed with the patient and questions answered. The benefits, risks, alternatives and complications were also discussed. The patient understands and wishes to proceed with the procedure. Written consent was obtained. Ultrasound was performed to localize and mark an adequate pocket of fluid in the left chest. The area was then prepped and draped in the normal sterile fashion. 1% Lidocaine was used for local anesthesia. Under ultrasound guidance a 6 Fr Safe-T-Centesis catheter was introduced. Thoracentesis was performed. The catheter was removed and a dressing applied. FINDINGS: A total of approximately 400 mL of dark red fluid was removed. IMPRESSION: Successful ultrasound guided left thoracentesis yielding 400 mL of pleural fluid. Read by: Earley Abide, PA-C Electronically Signed   By: Markus Daft M.D.   On: 11/15/2018 14:32    ASSESSMENT: 58 y.o. [place] woman presenting 10/26/2018 with right-sided inflammatory breast cancer, stage IV, involving lungs, lymph nodes and bones, as follows:             (a) chest CT scan 10/26/2018 shows bilateral pleural effusions, possible lymphangitic spread of tumor, diffuse bony metastatic disease, and significant axillary mediastinal and hilar adenopathy             (  b) bone scan 10/27/2018 is a "near SuperScan" consistent with widespread bony metastatic disease             (c) head CT with and without contrast 10/30/2018 shows no intracranial metastatic disease, multiple calvarial lesions             (d) CA-27-29 on 10/27/2018 was 1810.4  (1) pleural fluid from  right thoracentesis 10/28/2018 confirms malignant cells consistent with a breast primary, strongly estrogen and progesterone receptor positive, HER-2 not amplified, with an MIB-1 of 2%  (2) anastrozole started 10/29/2018             (a) palbociclib to start as soon as available             (b) denosumab/xgeva started 11/13/2018  (3) associated problems:             (a) hypoxia secondary to effusions             (b) pain from bone lesions             (c) right upper extremity lymphedema             (d) anemia, requiring transfusion             (e) poor venous access  (4) genetics testing pending  (5) hypocalcemia  (6) hypokalemia  (7) Anemia  (8) thrombocytopenia  (9) goals of care  PLAN:  Suzanne [name] is still having some shortness of breath but reports that this is improving.  Recommend she continue her anastrozole. Will start her on Ibrance 125 mg daily. According to outpatient oral chemotherapy pharmacist, patient will need to bring in her own supply.  Since she remains very weak still and is at high risk for readmission, recommend skilled nursing facility placement.  This has been discussed with her and she is agreeable.  Social work is following.  Hypocalcemia is improving.  Likely related to her recent Xgeva injection which was given on 11/13/2018.  Oral calcium will need to be continued as an outpatient.  The patient's hemoglobin is 8.2.  She is not actively bleeding.  Recommend continued monitoring of her CBC and transfuse packed red blood cells if her hemoglobin is less than 7.0 or active bleeding.  No transfusion is indicated today.  She has mild thrombocytopenia likely related to her bone involvement of her cancer.  Recommend continued monitoring.  No transfusion is indicated.  Will follow with you   LOS: 10 days   Mikey Bussing, DNP, AGPCNP-BC, AOCNP 11/24/18    ADDENDUM: Suzanne [name]'s daughter (who has cerebral palsy but who is high-functioning per patient's report--  I have ot been able to meet her due to pandemic restrictions) will try to arrange for a friend to bring the palbociclib so Starletta can get it started.  I am hopeful within 2 months on both these medications (anastrozole and palbociclib) her symptoms as far as the breast cancer is concerned will diminish. She still will have morbid obesity and other significant comorbidities.  I greatly apprciate the extraordinary efforts of the medical and supportive staff on behalf of this patient!  Chauncey Cruel, MD Medical Oncology and Hematology [facility] [address] [place], [place] [address] Tel. [phone]    Fax. [phone]

## 2018-11-24 NOTE — Progress Notes (Signed)
Physical Therapy Treatment Patient Details Name: Suzanne [name] MRN: [id number] DOB: 23-Oct-1960 Today's Date: 11/24/2018    History of Present Illness 58 y.o. 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  Patient underwent blood transfusion 11/13/18 and subsequently had SOB, DOE and orthopnea assoicated with cough. Dx of recurrent L pleural effusion, new onset CHF.    PT Comments    Pt is progressing slowly with mobility. Limited by rapid fatigue, DOE. May need SNF vs home with HHPT and 24hr assist depending on acute LOS/progress/level of home support. Will continue to follow acutely    Follow Up Recommendations  SNF(vs HHPT +24 hour care)     Equipment Recommendations  None recommended by PT    Recommendations for Other Services       Precautions / Restrictions Precautions Precautions: Fall Type of Shoulder Precautions: Previous possible right clavicle fx. Per MD noted stabilize RUE and refrain from ROM. Possible left side rib fracture Precaution Booklet Issued: No Precaution Comments: L shoulder "bad" per pt at baseline, RUE not painful, pt does not recall precautions Restrictions Weight Bearing Restrictions: No    Mobility  Bed Mobility Overal bed mobility: Needs Assistance Bed Mobility: Supine to Sit     Supine to sit: Min guard     General bed mobility comments: incr time, guarding for safety  Transfers   Equipment used: Rolling walker (2 wheeled) Transfers: Sit to/from [facility] Sit to Stand: Min assist Stand pivot transfers: Min assist       General transfer comment: Min assist to power up to standing cues for overall safety  Ambulation/Gait Ambulation/Gait assistance: Min guard;Min assist Gait Distance (Feet): 5 Feet Assistive device: Rolling walker (2 wheeled) Gait  Pattern/deviations: Step-to pattern;Decreased step length - right;Decreased step length - left     General Gait Details: steps forward and back to chair, incr time, pt fatigues easily, 3/4 DOE. HR max 115   Stairs             Wheelchair Mobility    Modified Rankin (Stroke Patients Only)       Balance   Sitting-balance support: Feet supported;No upper extremity supported Sitting balance-Leahy Scale: Good     Standing balance support: During functional activity;Bilateral upper extremity supported Standing balance-Leahy Scale: Poor(reliant  on UEs )                              Cognition Arousal/Alertness: Awake/alert Behavior During Therapy: WFL for tasks assessed/performed;Anxious                         Memory: Decreased short-term memory;Decreased recall of precautions Following Commands: Follows multi-step commands inconsistently;Follows one step commands with increased time              Exercises General Exercises - Lower Extremity Long Arc Quad: AROM;Both;5 reps;Seated Heel Raises: AROM;Both;15 reps    General Comments        Pertinent Vitals/Pain Pain Assessment: No/denies pain Faces Pain Scale: (none other than if moving L shoulder )    Home Living                      Prior Function            PT Goals (  current goals can now be found in the care plan section) Acute Rehab PT Goals Patient Stated Goal: to go home PT Goal Formulation: With patient Time For Goal Achievement: 12/02/18 Potential to Achieve Goals: Good Progress towards PT goals: Progressing toward goals(slowly)    Frequency    Min 3X/week      PT Plan Discharge plan needs to be updated    Co-evaluation              AM-PAC PT "6 Clicks" Mobility   Outcome Measure  Help needed turning from your back to your side while in a flat bed without using bedrails?: A Little Help needed moving from lying on your back to sitting on the side  of a flat bed without using bedrails?: A Little Help needed moving to and from a bed to a chair (including a wheelchair)?: A Little Help needed standing up from a chair using your arms (e.g., wheelchair or bedside chair)?: A Little Help needed to walk in hospital room?: A Lot Help needed climbing 3-5 steps with a railing? : A Lot 6 Click Score: 16    End of Session Equipment Utilized During Treatment: Oxygen(on extension in room at 3L) Activity Tolerance: Patient limited by fatigue Patient left: in chair;with call bell/phone within reach;with chair alarm set   PT Visit Diagnosis: Muscle weakness (generalized) (M62.81);Difficulty in walking, not elsewhere classified (R26.2)     Time: 1245-1301 PT Time Calculation (min) (ACUTE ONLY): 16 min  Charges:  $Therapeutic Activity: 8-22 mins                     Kenyon Ana, PT  Pager: [phone] Acute Rehab Dept [facility]): [phone]   11/24/2018    [facility] 11/24/2018, 1:12 PM

## 2018-11-24 NOTE — Progress Notes (Signed)
Nutrition Follow-up  DOCUMENTATION CODES:   Obesity unspecified  INTERVENTION:   Recommend liberalizing diet to REGULAR  Increase Ensure Enlive po TID, each supplement provides 350 kcal and 20 grams of protein   NUTRITION DIAGNOSIS:   Increased nutrient needs related to chronic illness, cancer and cancer related treatments as evidenced by estimated needs.  Being addressed via supplements  GOAL:   Patient will meet greater than or equal to 90% of their needs  Progressing  MONITOR:   PO intake, Supplement acceptance, Labs, Weight trends, I & O's  REASON FOR ASSESSMENT:   Malnutrition Screening Tool, Consult Assessment of nutrition requirement/status  ASSESSMENT:   58 year-old female with a known history of inflammatory breast cancer with mets to lungs, lymph nodes, and bone, complicated by bilateral malignant effusions and anemia. She presented to the ED  for evaluation of SOB. Patient underwent blood transfusion 6/19 and subsequently had SOB, DOE and orthopnea associated with cough. She has a thoracentesis of right then left lungs on 6/2 and 6/3 and was scheduled for a repeat.  6/22 CT chest with widespread metastatic disease to bones 6/28 Thoracentesis with 400 mL of dark red pleural fluid removed  Attempted to speak to patient via telephone x 2 but no answer  Limited documentation of po intake; recorded po intake 0-100% of meals, 38% on average. Per med history, pt taking some Ensure, unsure of much  Weight down to 86 kg; admission weight 101.2 kg; net negative 14 L per I/O flow sheet  Labs: calcium 6.7 (no recent albumin), serum ionized calcium 3.6 (L) from 11/22/18 Meds: lasix, Kcl, Oscal with D   Diet Order:   Diet Order            Diet Heart Room service appropriate? Yes; Fluid consistency: Thin  Diet effective now              EDUCATION NEEDS:   No education needs have been identified at this time  Skin:  Skin Assessment: Skin Integrity  Issues: Skin Integrity Issues:: Stage II Stage II: sacrum  Last BM:  6/27  Height:   Ht Readings from Last 1 Encounters:  11/16/18 5\' 2"  (1.575 m)    Weight:   Wt Readings from Last 1 Encounters:  11/24/18 86 kg    Ideal Body Weight:  50 kg  BMI:  Body mass index is 34.68 kg/m.  Estimated Nutritional Needs:   Kcal:  9678-9381 kcal  Protein:  120-130 grams  Fluid:  >/= 2 L/day   Kerman Passey MS, RDN, LDN, CNSC [phone] Pager  [phone] Weekend/On-Call Pager

## 2018-11-24 NOTE — Progress Notes (Signed)
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  Patient admitted underwent thoracentesis of the right and left lungs on 6/2 and 6/3 and scheduled for repeat 3 days later. Patient was admitted respiratory status worsened and patient placed on the BiPAP and transferred to the stepdown unit.  Patient also placed on IV diuretics.  Patient noted to spike a fever and as such also placed on IV antibiotics for presumed aspiration pneumonia.  Due to tenuous respiratory status critical care consulted. Patient underwent left-sided thoracentesis on 11/15/2018 with 400 cc of back red pleural fluid removed.  Cytology from thoracentesis of 10/28/2018+ for malignant cells consistent with breast cancer.  COVID-19 negative.  IV antibiotics narrowed down to oral Augmentin starting 11/19/2018.  Lower extremity Dopplers which were done were negative.  Patient now on intermittent BiPAP mainly at night and on 3 L nasal cannula in the daytime however still with use of accessory muscles of respiration with poor air movement.  Palliative care consulted for goals of care.  Oncology following as well.   Assessment & Plan:   Principal Problem:   Acute on chronic respiratory failure with hypoxia (HCC) Active Problems:   Shortness of breath   Hypokalemia   Anemia   Bone metastasis (HCC)   Pleural effusion, bilateral   Malignant neoplasm of overlapping sites of right breast in female, estrogen receptor positive ([place])   Morbid obesity with BMI of 40.0-44.9, adult (HCC)   Carcinoma of  breast, estrogen receptor positive, stage 4, right (HCC)   Hypoalbuminemia   Palliative care by specialist   Pleural effusion, malignant   Hypophosphatemia   Hypocalcemia   Aspiration pneumonia (HCC)   SOB (shortness of breath)  1 acute on chronic hypoxic respiratory failure Felt likely multifactorial secondary to malignant pleural effusions, metastatic stage IV breast cancer with malignant effusion, possible aspiration pneumonia, volume overload. Patient was requiring noninvasive BiPAP on and off and wearing it intermittently.  Patient noted to be refusing BiPAP over the past 3 nights. Currently on 3.5 L nasal cannula.  Patient status post thoracentesis on the left 11/23/2018 with 400 cc of dark red pleural fluid removed.  Last chest x-ray done (6/24) was stable low volume chest with atelectasis and interstitial opacity.  CT chest done on 11/17/2018 with widespread metastatic disease to the bones redemonstrated with multiple nondisplaced minimally displaced bilateral rib fractures as well as new widely displaced fracture of the medial aspect of the right clavicle with probable adjacent intramuscular hemorrhage in the lower right sternocleidomastoid muscle and upper right pectoralis major muscle.  Probable tumor infiltration throughout the right breast as well as bilateral axillary and soft pectoral lymphadenopathy.  Unusual appearance of the lungs similar to prior study could suggest chronic systemic disease such as sarcoidosis or interstitial lung disease.  Possibility of lymphangitic spread of tumor in the lungs could be considered but not strongly favored.  Moderate left pleural effusion and small right pleural effusion.  Cholelithiasis.  Lower extremity Dopplers negative for DVT.  COVID-19 test negative.  Patient did have increased O2 requirements and shortness of breath and use of  accessory muscles of respiration over the past few days which is slowly improving with diuresis and antibiotics.  On IV  Lasix with a urine output of 1.820 L over the past 24 hours.  Patient is -14.133 L during this hospitalization.  Current weight of 189.6pounds from 191.14 pounds from 198.19 pounds from 204.15 pounds from 210 pounds from 233.69 pounds on admission.  Patient was empirically on IV antibiotics of cefepime and subsequently Zosyn and has been transitioned to Augmentin per PCCM.  Continue pulmonary hygiene.  Continue scheduled Xopenex and Atrovent nebs.  Transition from IV Lasix to oral Lasix 40 mg daily starting tomorrow 11/25/2018.  Continue strict I's and O's.  Daily weights.   2.  Malignant pleural effusion/loculated pleural effusion/right-sided inflammatory stage IV breast cancer involving lungs, lymph nodes, bones Status post thoracentesis on the left with 400 cc of dark red pleural fluid removed.  Cytology from thoracentesis 10/28/2018+ for malignant cells consistent with breast cancer.  Patient may require Pleurx catheter if recurrence of effusion however not at this time per PCCM.  Patient with extensive progressive disease.  Oncology and palliative care following.  Continue Arimidex.  Oncology planning to add palbociclib as an outpatient however per oncology if prolonged hospitalization will probably start palbociclib next week.  Per oncology.  3.  Hypocalcemia Likely secondary to recent Xgeva injection given on 11/13/2018.  Corrected calcium at 7.45.  Increased oral calcium supplementation to 3 times daily.  Patient status post IV calcium gluconate on 11/18/2018.  Patient has been ordered IV calcium gluconate 1 g IV x3 doses on 11/21/2018.  Status post IV calcium gluconate 2 g IV on 11/22/2018 and 11/23/2018.  We will give IV calcium gluconate 2 g IV x1 today.  Keep magnesium greater than 2.  Follow.  4.  Hypokalemia Likely secondary to diuresis.  Continue K. Dur 40 mEq p.o. daily.  Magnesium at 2.  Follow.   5.  Bilateral lower extremity rash on feet and legs Unclear etiology.  Patient with some mild  petechia which is slowly improving.  May be secondary to transfusion obtained prior to admission.  Follow for now.  6.  Probable aspiration pneumonia Patient noted to be febrile during this hospitalization.  Fever curve trended down.  MRSA PCR negative.  Patient was on IV cefepime subsequently transitioned to IV Zosyn which have been discontinued.  Patient transitioned to oral Augmentin for 3 more days which she has completed.  No further antibiotics needed.   7.  Hypophosphatemia Repleted.  8.  Microcytic anemia Patient received transfusion prior to admission.  Patient with no signs or symptoms of bleeding.  Hemoglobin at 8.2.  Follow.  Transfusion threshold hemoglobin less than 7.  Oncology following.  9.  Volume overload in the setting of blood transfusion versus suspected acute diastolic CHF Patient noted to be short of breath posttransfusion.  Patient given IV Lasix during the hospitalization and was on 40 IV every 12 hours down from 80 IV every 12 hours.  2D echo from 10/27/2018 with a EF of 60 to 41%, diastolic Doppler parameters indeterminate.  Patient with a urine output of 1.820 L over the past 24 hours.  Patient is  -14.133 L during this hospitalization. Lasix increased to 60 mg IV every 8 hours on 11/20/2018.  Patient's current weight is 189.6 pounds from 191.14 pounds from 198.19 pounds from 204.15 pounds from 210.1 pounds from 233.69 pounds on admission.  Discontinue IV Lasix and transition to oral Lasix 40 mg daily starting 11/25/2018.  Increase Coreg to 6.25 mg p.o. twice daily.  Strict I's and O's.  Daily weights.  Follow.   10.  Obesity  11.  Diarrhea Patient with loose stools which are improving after being given dose of Imodium on 11/20/2018.  Rectal pouch has been discontinued.  No further diarrhea.  Follow.  12.  Sinus tachycardia Likely secondary to volume overload and problem #1.  TSH of 4.251.  Improving.  Increase Coreg to 6.25 twice daily. Follow.     DVT prophylaxis:  Lovenox. Code Status: Full Family Communication: Updated patient.  No family at bedside. Disposition Plan: Transfer to telemetry.  Likely home with home health versus SNF.   Consultants:   PCCM: Dr. Carson Myrtle 11/16/2018  Palliative care: Dr. Rowe Pavy 11/18/2018  Allergy: Dr. Jana Hakim 11/17/2018  Procedures:  6/02 Right Pleural Fluid Cytology >> positive for malignant cells c/w breast cancer  6/20 Left Thoracentesis >> 400 ml dark red pleural fluid removed  11/17/2018 lower extremity Dopplers Chest x-ray: 11/14/2018, 11/15/2018, 11/16/2018, 11/17/2018, 11/18/2018, 11/19/2018 CT chest 11/17/2018    Antimicrobials:  Cefepime 6/21  Zosyn 6/22-6/24 Augmentin 6/24 (for 3 days)>>>> 11/22/2018  Micro Data:  COVID 6/19 >> negative     Subjective: Patient sleeping however easily arousable.  Tearful.  States shortness of breath is improving daily.  Denies any chest pain.  Refused BiPAP overnight.  On 3-1/2 L of oxygen nasal cannula.  Denies any chest pain or abdominal pain.  No loose stools.   Objective: Vitals:   11/24/18 0600 11/24/18 0700 11/24/18 0800 11/24/18 0816  BP: 128/65 127/83 135/78   Pulse:  (!) 102 (!) 103 (!) 105  Resp: (!) 23 16 (!) 21 20  Temp:   98 F (36.7 C)   TempSrc:   Oral   SpO2:  100% 97% 96%  Weight:      Height:        Intake/Output Summary (Last 24 hours) at 11/24/2018 0934 Last data filed at 11/24/2018 0500 Gross per 24 hour  Intake 30 ml  Output 1820 ml  Net -1790 ml   Filed Weights   11/22/18 0500 11/23/18 0500 11/24/18 0500  Weight: 89.9 kg 86.7 kg 86 kg    Examination:  General exam: NAD Respiratory system: Fair air movement.  Clear to auscultation bilaterally anterior lung fields.  No wheezing, no crackles, no rhonchi.  No use of accessory muscles of respiration.  Cardiovascular system: Tachycardia.  No JVD, murmurs, rubs, gallops or clicks.  Trace bilateral lower extremity edema.  Gastrointestinal system: Abdomen is soft, nontender, nondistended,  positive bowel sounds.  No rebound.  No guarding.  Central nervous system: Sleeping however easily arousable.  Oriented.  Following commands appropriately.  Moving extremities spontaneously.  No focal neurological deficits. Extremities: Trace bilateral lower extremity edema.  Symmetric 5 x 5 power. Skin: Slowly improving petechial rash on bilateral feet and distal lower extremities. Psychiatry: Judgement and insight appear normal. Mood & affect appropriate.     Data Reviewed: I have personally reviewed following labs and imaging studies  CBC: Recent Labs  Lab 11/19/18 0231 11/20/18 0223 11/21/18 0222 11/22/18 0224 11/23/18 0258 11/24/18 0247  WBC 8.0 9.1 8.9 9.6 10.5 10.4  NEUTROABS 5.1 5.5  --  5.1 5.7 5.9  HGB 8.1* 8.0* 8.6* 8.5* 8.6* 8.2*  HCT 26.5* 26.7* 28.8* 28.3* 29.2* 27.4*  MCV 103.9* 104.3* 106.3* 104.4* 105.8* 104.6*  PLT 96* 97* 97* 103* 112* 809*   Basic Metabolic Panel: Recent Labs  Lab 11/18/18 0955 11/19/18 0231  11/20/18 0223 11/21/18 0222 11/22/18 0224 11/23/18 0258 11/24/18 0247  NA 140 137   < > 139 141 138 136 134*  K 3.4* 2.6*   < > 3.1* 3.4* 3.9 3.8 3.9  CL 96* 92*   < > 93* 94* 94* 92* 92*  CO2 27 31   < > 33* 35* 32 32 29  GLUCOSE 115* 114*   < > 156* 131* 131* 138* 133*  BUN 14 13   < > 12 13 16 17  21*  CREATININE 0.68 0.71   < > 0.58 0.60 0.66 0.72 0.75  CALCIUM 5.4* 5.1*   < > 5.1* 5.8* 6.3* 6.8* 6.7*  MG 2.0 1.9  --  2.0 1.9 2.0  --   --   PHOS 1.8* 1.8*  --  2.5  --   --   --   --    < > = values in this interval not displayed.   GFR: Estimated Creatinine Clearance: 77.1 mL/min (by C-G formula based on SCr of 0.75 mg/dL). Liver Function Tests: Recent Labs  Lab 11/18/18 0955 11/19/18 0231  AST 22 22  ALT 15 14  ALKPHOS 195* 194*  BILITOT 0.8 0.8  PROT 6.3* 5.7*  ALBUMIN 2.6* 2.3*   No results for input(s): LIPASE, AMYLASE in the last 168 hours. No results for input(s): AMMONIA in the last 168 hours. Coagulation Profile: No  results for input(s): INR, PROTIME in the last 168 hours. Cardiac Enzymes: No results for input(s): CKTOTAL, CKMB, CKMBINDEX, TROPONINI in the last 168 hours. BNP (last 3 results) No results for input(s): PROBNP in the last 8760 hours. HbA1C: No results for input(s): HGBA1C in the last 72 hours. CBG: Recent Labs  Lab 11/20/18 2121 11/22/18 1639  GLUCAP 140* 161*   Lipid Profile: No results for input(s): CHOL, HDL, LDLCALC, TRIG, CHOLHDL, LDLDIRECT in the last 72 hours. Thyroid Function Tests: No results for input(s): TSH, T4TOTAL, FREET4, T3FREE, THYROIDAB in the last 72 hours. Anemia Panel: No results for input(s): VITAMINB12, FOLATE, FERRITIN, TIBC, IRON, RETICCTPCT in the last 72 hours. Sepsis Labs: Recent Labs  Lab 11/18/18 0212  PROCALCITON 1.69    Recent Results (from the past 240 hour(s))  SARS Coronavirus 2 (CEPHEID - Performed in [place] hospital lab), Hosp Order     Status: None   Collection Time: 11/14/18  4:34 PM   Specimen: Nasopharyngeal Swab  Result Value Ref Range Status   SARS Coronavirus 2 NEGATIVE NEGATIVE Final    Comment: (NOTE) If result is NEGATIVE SARS-CoV-2 target nucleic acids are NOT DETECTED. The SARS-CoV-2 RNA is generally detectable in upper and lower  respiratory specimens during the acute phase of infection. The lowest  concentration of SARS-CoV-2 viral copies this assay can detect is 250  copies / mL. A negative result does not preclude SARS-CoV-2 infection  and should not be used as the sole basis for treatment or other  patient management decisions.  A negative result may occur with  improper specimen collection / handling, submission of specimen other  than nasopharyngeal swab, presence of viral mutation(s) within the  areas targeted by this assay, and inadequate number of viral copies  (<250 copies / mL). A negative result must be combined with clinical  observations, patient history, and epidemiological information. If result is  POSITIVE SARS-CoV-2 target nucleic acids are DETECTED. The SARS-CoV-2 RNA is generally detectable in upper and lower  respiratory specimens dur ing the acute phase of infection.  Positive  results are indicative of  active infection with SARS-CoV-2.  Clinical  correlation with patient history and other diagnostic information is  necessary to determine patient infection status.  Positive results do  not rule out bacterial infection or co-infection with other viruses. If result is PRESUMPTIVE POSTIVE SARS-CoV-2 nucleic acids MAY BE PRESENT.   A presumptive positive result was obtained on the submitted specimen  and confirmed on repeat testing.  While 2019 novel coronavirus  (SARS-CoV-2) nucleic acids may be present in the submitted sample  additional confirmatory testing may be necessary for epidemiological  and / or clinical management purposes  to differentiate between  SARS-CoV-2 and other Sarbecovirus currently known to infect humans.  If clinically indicated additional testing with an alternate test  methodology [phone]) is advised. The SARS-CoV-2 RNA is generally  detectable in upper and lower respiratory sp ecimens during the acute  phase of infection. The expected result is Negative. Fact Sheet for Patients:  StrictlyIdeas.no Fact Sheet for Healthcare Providers: BankingDealers.co.za This test is not yet approved or cleared by the [place] FDA and has been authorized for detection and/or diagnosis of SARS-CoV-2 by FDA under an Emergency Use Authorization (EUA).  This EUA will remain in effect (meaning this test can be used) for the duration of the COVID-19 declaration under Section 564(b)(1) of the Act, 21 U.S.C. section 360bbb-3(b)(1), unless the authorization is terminated or revoked sooner. Performed at [facility], [place] [address], [place], [place] [address]   MRSA PCR Screening     Status: None    Collection Time: 11/16/18  8:02 AM   Specimen: Nasal Mucosa; Nasopharyngeal  Result Value Ref Range Status   MRSA by PCR NEGATIVE NEGATIVE Final    Comment:        The GeneXpert MRSA Assay (FDA approved for NASAL specimens only), is one component of a comprehensive MRSA colonization surveillance program. It is not intended to diagnose MRSA infection nor to guide or monitor treatment for MRSA infections. Performed at [facility], [place] [address] [place], [place] [address]          Radiology Studies: No results found.      Scheduled Meds:  anastrozole  1 mg Oral Daily   calcium-vitamin D  1 tablet Oral TID   carvedilol  3.125 mg Oral BID WC   Chlorhexidine Gluconate Cloth  6 each Topical Daily   [START ON 11/25/2018] enoxaparin (LOVENOX) injection  40 mg Subcutaneous Daily   feeding supplement (ENSURE ENLIVE)  237 mL Oral BID BM   [START ON 11/25/2018] furosemide  40 mg Oral Daily   ipratropium  0.5 mg Nebulization BID   levalbuterol  0.63 mg Nebulization BID   mouth rinse  15 mL Mouth Rinse q12n4p   palbociclib  125 mg Oral Q breakfast   potassium chloride  40 mEq Oral Daily   Continuous Infusions:  sodium chloride Stopped (11/16/18 1407)     LOS: 10 days    Time spent: 40 minutes    Irine Seal, MD [facility]  If 7PM-7AM, please contact night-coverage [email or web address] 11/24/2018, 9:34 AM

## 2018-11-25 LAB — COMPREHENSIVE METABOLIC PANEL
ALT: 31 U/L (ref 0–44)
AST: 37 U/L (ref 15–41)
Albumin: 2.5 g/dL — ABNORMAL LOW (ref 3.5–5.0)
Alkaline Phosphatase: 190 U/L — ABNORMAL HIGH (ref 38–126)
Anion gap: 13 (ref 5–15)
BUN: 22 mg/dL — ABNORMAL HIGH (ref 6–20)
CO2: 29 mmol/L (ref 22–32)
Calcium: 6.9 mg/dL — ABNORMAL LOW (ref 8.9–10.3)
Chloride: 92 mmol/L — ABNORMAL LOW (ref 98–111)
Creatinine, Ser: 0.8 mg/dL (ref 0.44–1.00)
GFR calc Af Amer: >60 mL/min (ref >60)
GFR calc non Af Amer: >60 mL/min (ref >60)
Glucose, Bld: 139 mg/dL — ABNORMAL HIGH (ref 70–99)
Potassium: 3.8 mmol/L (ref 3.5–5.1)
Sodium: 134 mmol/L — ABNORMAL LOW (ref 135–145)
Total Bilirubin: 1 mg/dL (ref 0.3–1.2)
Total Protein: 7.1 g/dL (ref 6.5–8.1)

## 2018-11-25 LAB — CBC
HCT: 26.4 % — ABNORMAL LOW (ref 36.0–46.0)
Hemoglobin: 8.2 g/dL — ABNORMAL LOW (ref 12.0–15.0)
MCH: 31.8 pg (ref 26.0–34.0)
MCHC: 31.1 g/dL (ref 30.0–36.0)
MCV: 102.3 fL — ABNORMAL HIGH (ref 80.0–100.0)
Platelets: 120 10*3/uL — ABNORMAL LOW (ref 150–400)
RBC: 2.58 MIL/uL — ABNORMAL LOW (ref 3.87–5.11)
RDW: 17.1 % — ABNORMAL HIGH (ref 11.5–15.5)
WBC: 12.1 10*3/uL — ABNORMAL HIGH (ref 4.0–10.5)
nRBC: 1 % — ABNORMAL HIGH (ref 0.0–0.2)

## 2018-11-25 LAB — MAGNESIUM: Magnesium: 2.1 mg/dL (ref 1.7–2.4)

## 2018-11-25 MED ORDER — FUROSEMIDE 10 MG/ML IJ SOLN
40.0000 mg | Freq: Once | INTRAMUSCULAR | Status: AC
  Administered 2018-11-25: 40 mg via INTRAVENOUS
  Filled 2018-11-25: qty 4

## 2018-11-25 MED ORDER — HYDROCOD POLST-CPM POLST ER 10-8 MG/5ML PO SUER
5.0000 mL | Freq: Once | ORAL | Status: AC
  Administered 2018-11-25: 5 mL via ORAL
  Filled 2018-11-25: qty 5

## 2018-11-25 MED ORDER — BENZONATATE 100 MG PO CAPS
200.0000 mg | ORAL_CAPSULE | Freq: Three times a day (TID) | ORAL | Status: AC
  Administered 2018-11-25 – 2018-11-30 (×15): 200 mg via ORAL
  Filled 2018-11-25 (×15): qty 2

## 2018-11-25 MED ORDER — HYDROCODONE-HOMATROPINE 5-1.5 MG/5ML PO SYRP
5.0000 mL | ORAL_SOLUTION | ORAL | Status: DC | PRN
  Administered 2018-11-25 – 2018-12-01 (×12): 5 mL via ORAL
  Filled 2018-11-25 (×12): qty 5

## 2018-11-25 MED ORDER — CALCIUM GLUCONATE-NACL 2-0.675 GM/100ML-% IV SOLN
2.0000 g | Freq: Once | INTRAVENOUS | Status: AC
  Administered 2018-11-25: 2000 mg via INTRAVENOUS
  Filled 2018-11-25: qty 100

## 2018-11-25 MED ORDER — FUROSEMIDE 40 MG PO TABS
40.0000 mg | ORAL_TABLET | Freq: Two times a day (BID) | ORAL | Status: DC
  Administered 2018-11-25 – 2018-12-01 (×12): 40 mg via ORAL
  Filled 2018-11-25 (×12): qty 1

## 2018-11-25 NOTE — Progress Notes (Signed)
   11/25/18 1100  Clinical Encounter Type  Visited With Family;Patient  Visit Type Follow-up;Spiritual support;Psychological support  Referral From Physician  Consult/Referral To Chaplain  Spiritual Encounters  Spiritual Needs Emotional;Other (Comment) (Spiritual Care Conversation/Support)  Stress Factors  Patient Stress Factors Other (Comment)  Family Stress Factors Other (Comment)  Advance Directives (For Healthcare)  Does Patient Have a Medical Advance Directive? No  Would patient like information on creating a medical advance directive? No - Patient declined   I spoke with the patient and her daughter again per spiritual care consult about creating an Advance Directive per physicians request. The patient had declined creating one last week due to the fact that she wants her daughter, who is her legal next-of-kin, to make her healthcare decisions for her in the event that she is unable to. The patient stated that this is still true and that she does not want to complete an Advance Directive at this time.   Please, contact Spiritual Care for further assistance.   Chaplain Shanon Ace M.Div., [facility]

## 2018-11-25 NOTE — Progress Notes (Signed)
Occupational Therapy Treatment Patient Details Name: Suzanne [name] MRN: [id number] DOB: [birth year] Today's Date: 11/25/2018    History of present illness 58 y.o. 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  Patient underwent blood transfusion 11/13/18 and subsequently had SOB, DOE and orthopnea assoicated with cough. Dx of recurrent L pleural effusion, new onset CHF.   OT comments  Pt reports being very fatiqued.  Willing to get up to [facility] and chair.  Educated pt on performing small bursts of activity to increase endurance.  Feel pt will need snf for rehab due to decreased activity tolerance. Will continue to follow   Follow Up Recommendations  SNF    Equipment Recommendations  3 in 1 bedside commode    Recommendations for Other Services      Precautions / Restrictions Precautions Precautions: Fall Type of Shoulder Precautions: Previous possible right clavicle fx. Per MD noted stabilize RUE and refrain from ROM. Possible left side rib fracture Precaution Booklet Issued: No Precaution Comments: L shoulder "bad" per pt at baseline, RUE not painful, pt does not recall precautions Restrictions Weight Bearing Restrictions: No       Mobility Bed Mobility          light Min A for trunk to get to EOB        Transfers   Equipment used: Rolling walker (2 wheeled)   Sit to Stand: Min guard Stand pivot transfers: Min guard       General transfer comment: for safety    Balance           Standing balance support: During functional activity;Bilateral upper extremity supported Standing balance-Leahy Scale: Poor                             ADL either performed or assessed with clinical judgement   ADL                           Toilet Transfer: Min guard;Stand-pivot;BSC;RW    Toileting- Clothing Manipulation and Hygiene: Total assistance;Sit to/from stand         General ADL Comments: Pt agreeable to using BSC and up to chair. Sat on bed between  these 2 things.  She feels exhausted and did not want to work on bathing nor brushing teeth, despite neither having been done today.  Educated on performing small burst of activity to increase strength and endurance. 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 associate     Praxis      Cognition Arousal/Alertness: Awake/alert Behavior During Therapy: [facility] for tasks assessed/performed;Anxious Overall Cognitive Status: Within Functional Limits for tasks assessed                                          Exercises     Shoulder Instructions       General Comments      Pertinent Vitals/ Pain       Pain Assessment: Faces Faces Pain Scale: Hurts a little bit Pain Location: LUE Pain Intervention(s): Limited activity within patient's tolerance;Monitored during session  Home Living  Prior Functioning/Environment              Frequency  Min 2X/week        Progress Toward Goals  OT Goals(current goals can now be found in the care plan section)  Progress towards OT goals: Progressing toward goals(slowly)     Plan Discharge plan needs to be updated    Co-evaluation                 AM-PAC OT "6 Clicks" Daily Activity     Outcome Measure   Help from another person eating meals?: A Little Help from another person taking care of personal grooming?: A Little Help from another person toileting, which includes using toliet, bedpan, or urinal?: A Lot Help from another person bathing (including washing, rinsing, drying)?: A Lot Help from another person to put on and taking off regular upper body clothing?: A Little Help from another person to put on and taking off regular lower body  clothing?: A Lot 6 Click Score: 15    End of Session    OT Visit Diagnosis: Muscle weakness (generalized) (M62.81);Unsteadiness on feet (R26.81)   Activity Tolerance Patient limited by fatigue   Patient Left in chair;with call bell/phone within reach;with chair alarm set   Nurse Communication          Time: [phone] OT Time Calculation (min): 24 min  Charges: OT General Charges $OT Visit: 1 Visit OT Treatments $Self Care/Home Management : 8-22 mins $Therapeutic Activity: 8-22 mins  Lesle Chris, OTR/L Acute Rehabilitation Services [phone] [place] pager [phone] office 11/25/2018   [place] 11/25/2018, 3:27 PM

## 2018-11-25 NOTE — Progress Notes (Addendum)
HEMATOLOGY-ONCOLOGY PROGRESS NOTE  SUBJECTIVE: More alert and talkative this morning. Breathing better. Reports non-productive cough. LE edema improved. Reports that she has worked with PT, but has not ambulated in the hall yet.   REVIEW OF SYSTEMS:   Constitutional: Denies fevers, chills Eyes: Denies blurriness of vision Ears, nose, mouth, throat, and face: Denies mucositis or sore throat Respiratory: Shortness of breath has improved.  Has developed a nonproductive cough. Cardiovascular: Denies palpitation, chest discomfort Gastrointestinal:  Denies nausea, heartburn or change in bowel habits Skin: Rash to bilateral feet has resolved. Lymphatics: Denies new lymphadenopathy or easy bruising Neurological:Denies numbness, tingling or new weaknesses Behavioral/Psych: Reports being tearful.   Extremities: Reports that the swelling in her lower extremities has improved. All other systems were reviewed with the patient and are negative.  I have reviewed the past medical history, past surgical history, social history and family history with the patient and they are unchanged from previous note.   PHYSICAL EXAMINATION:  Vitals:   11/24/18 2119 11/25/18 0539  BP: 133/74 127/88  Pulse: 100 99  Resp: 16 17  Temp: 98.4 F (36.9 C) 97.8 F (36.6 C)  SpO2: 94% 96%   Filed Weights   11/23/18 0500 11/24/18 0500 11/25/18 0800  Weight: 191 lb 2.2 oz (86.7 kg) 189 lb 9.5 oz (86 kg) 191 lb 12.8 oz (87 kg)    Intake/Output from previous day: 06/29 0701 - 06/30 0700 In: 400 [P.O.:300; IV Piggyback:100] Out: -   GENERAL:alert, distress SKIN: No rashes or open areas. EYES: normal, Conjunctiva are pink and non-injected, sclera clear OROPHARYNX:no exudate, no erythema and lips, buccal mucosa, and tongue normal  NECK: supple, thyroid normal size, non-tender, without nodularity LYMPH:  no palpable lymphadenopathy in the cervical, axillary or inguinal LUNGS: Clear HEART: regular rate & rhythm  and no murmurs.  No lower extremity edema. ABDOMEN:abdomen soft, non-tender and normal bowel sounds Musculoskeletal:no cyanosis of digits and no clubbing  NEURO: alert & oriented x 3 with fluent speech, no focal motor/sensory deficits.  LABORATORY DATA:  I have reviewed the data as listed CMP Latest Ref Rng & Units 11/25/2018 11/24/2018 11/23/2018  Glucose 70 - 99 mg/dL 139(H) 133(H) 138(H)  BUN 6 - 20 mg/dL 22(H) 21(H) 17  Creatinine 0.44 - 1.00 mg/dL 0.80 0.75 0.72  Sodium 135 - 145 mmol/L 134(L) 134(L) 136  Potassium 3.5 - 5.1 mmol/L 3.8 3.9 3.8  Chloride 98 - 111 mmol/L 92(L) 92(L) 92(L)  CO2 22 - 32 mmol/L 29 29 32  Calcium 8.9 - 10.3 mg/dL 6.9(L) 6.7(L) 6.8(L)  Total Protein 6.5 - 8.1 g/dL 7.1 - -  Total Bilirubin 0.3 - 1.2 mg/dL 1.0 - -  Alkaline Phos 38 - 126 U/L 190(H) - -  AST 15 - 41 U/L 37 - -  ALT 0 - 44 U/L 31 - -    Lab Results  Component Value Date   WBC 12.1 (H) 11/25/2018   HGB 8.2 (L) 11/25/2018   HCT 26.4 (L) 11/25/2018   MCV 102.3 (H) 11/25/2018   PLT 120 (L) 11/25/2018   NEUTROABS 5.9 11/24/2018    Dg Chest 1 View  Result Date: 11/15/2018 CLINICAL DATA:  Post left-sided thoracentesis. EXAM: CHEST  1 VIEW COMPARISON:  11/14/2018 FINDINGS: Lungs are hypoinflated demonstrate interval improvement with moderate residual left-sided pleural effusion likely with associated basilar atelectasis. No pneumothorax. Mild prominence of the perihilar markings without significant change. Cardiomediastinal silhouette and remainder of the exam is unchanged. IMPRESSION: Hypoinflation with interval improvement in left pleural  effusion post thoracentesis. No pneumothorax. Moderate residual left effusion likely with associated basilar atelectasis. Prominence of the perihilar markings suggesting vascular congestion. Electronically Signed   By: Marin Olp M.D.   On: 11/15/2018 13:58   Dg Chest 1 View  Result Date: 10/29/2018 CLINICAL DATA:  Post LEFT thoracentesis EXAM: CHEST  1  VIEW COMPARISON:  622 in 20 FINDINGS: Upper normal heart size. Stable mediastinal contours. BILATERAL upper lobe scarring. Improved aeration at bases since prior study. Minimal residual LEFT pleural effusion without pneumothorax. Bones demineralized. IMPRESSION: Extensive chronic lung changes. No pneumothorax following LEFT thoracentesis. Electronically Signed   By: Lavonia Dana M.D.   On: 10/29/2018 15:50   Dg Chest 1 View  Result Date: 10/28/2018 CLINICAL DATA:  Post right thoracentesis EXAM: CHEST  1 VIEW COMPARISON:  10/28/2018 FINDINGS: Very low lung volumes again noted. Interstitial prominence within the lungs. Heart is borderline in size with vascular congestion. No pneumothorax following right thoracentesis. Diffuse skeletal metastases again noted. IMPRESSION: Very low lung volumes with cardiomegaly, vascular congestion and interstitial prominence. No pneumothorax following thoracentesis. Electronically Signed   By: Rolm Baptise M.D.   On: 10/28/2018 16:00   Dg Chest 2 View  Result Date: 11/14/2018 CLINICAL DATA:  Stage IV breast cancer.  Shortness of breath. EXAM: CHEST - 2 VIEW COMPARISON:  Radiographs 11/13/2018 and 10/29/2018.  CT 10/26/2018. FINDINGS: The heart size and mediastinal contours are stable. Left pleural effusion and left basilar opacity are unchanged from the recent study. Diffusely increased interstitial markings are again noted throughout both lungs, remaining suspicious for lymphangitic spread of tumor based on previous CT. No progressive airspace opacities identified. Diffuse osseous metastatic disease noted without evidence of acute fracture. IMPRESSION: Stable radiographic appearance of the chest since yesterday. A left pleural effusion and left basilar pulmonary opacity have increased from older prior studies. Evidence of extensive metastatic disease again noted. Electronically Signed   By: Richardean Sale M.D.   On: 11/14/2018 17:10   Dg Chest 2 View  Result Date:  11/13/2018 CLINICAL DATA:  Shortness of breath, weakness. Cancer patient. EXAM: CHEST - 2 VIEW COMPARISON:  Chest x-ray dated 10/29/2018. Chest CT dated 10/26/2018. FINDINGS: Heart size and mediastinal contours appear stable. Increased opacity at the LEFT lung base, likely increased pleural effusion. There is also central pulmonary vascular congestion and bilateral perihilar edema. No pneumothorax seen. Diffuse osseous metastases, as described on previous CT report. IMPRESSION: 1. Increased opacity at the LEFT lung base, likely increased pleural effusion. Superimposed pneumonia or airspace collapse not excluded. 2. Central pulmonary vascular congestion and bilateral perihilar edema indicating CHF/volume overload. 3. Diffuse osseous metastases. Electronically Signed   By: Franki Cabot M.D.   On: 11/13/2018 12:32   Ct Head W & Wo Contrast  Result Date: 10/30/2018 CLINICAL DATA:  Metastatic breast cancer. No neurologic symptoms are reported. EXAM: CT HEAD WITHOUT AND WITH CONTRAST TECHNIQUE: Contiguous axial images were obtained from the base of the skull through the vertex without and with intravenous contrast CONTRAST:  55m ISOVUE-300 IOPAMIDOL (ISOVUE-300) INJECTION 61% COMPARISON:  Nuclear medicine whole-body bone scan, 10/27/2018 FINDINGS: Brain: No acute stroke, hemorrhage, mass lesion or hydrocephalus. Mild atrophy. No extra-axial fluid. Post infusion, no abnormal enhancement of the brain or visible meninges. Vascular: No hyperdense vessel or unexpected calcification. Visible vessels are patent. Skull: Widespread metastatic disease to the calvarium, skull base, and visible cervical spine. Metastatic disease to the mandible is also present. There is destruction of the inner and outer table of the skull  in multiple locations. Epidural tumor is not established. Sinuses/Orbits: No acute finding. Other: None. IMPRESSION: 1. Widespread osseous metastatic disease to the calvarium, skull base, and visible cervical  spine. Epidural tumor not established. 2. No intracranial metastatic disease is evident. Electronically Signed   By: Staci Righter M.D.   On: 10/30/2018 09:50   Ct Chest Wo Contrast  Result Date: 11/17/2018 CLINICAL DATA:  58 year old female Caucasian female nonsmoker with history of bilateral lower extremity edema. Possible stage IV breast cancer. EXAM: CT CHEST WITHOUT CONTRAST TECHNIQUE: Multidetector CT imaging of the chest was performed following the standard protocol without IV contrast. COMPARISON:  Chest CT 10/26/2018. FINDINGS: Cardiovascular: Heart size is normal. There is no significant pericardial fluid, thickening or pericardial calcification. There is aortic atherosclerosis, as well as atherosclerosis of the great vessels of the mediastinum and the coronary arteries, including calcified atherosclerotic plaque in the left anterior descending and right coronary arteries. Mediastinum/Nodes: No pathologically enlarged mediastinal or hilar lymph nodes. Please note that accurate exclusion of hilar adenopathy is limited on noncontrast CT scans. Esophagus is unremarkable in appearance. Multiple borderline enlarged and mildly enlarged bilateral axillary and subpectoral lymph nodes (right greater than left) measuring up to 15 mm in short axis on the right (axial image 49 of series 2). Lungs/Pleura: Extensive septal thickening with band like opacities throughout both lungs, similar to the prior study. No discrete pulmonary nodules or masses are confidently identified. Moderate left and small right pleural effusions. The left pleural effusion is predominantly sub pulmonic in position. Upper Abdomen: Several peripherally calcified gallstones lying dependently in the gallbladder measuring up to 1.9 cm in diameter. Aortic atherosclerosis. Musculoskeletal: Right breast is incompletely imaged, but there appears to be irregular soft tissue infiltration throughout much of the right breast tissue, which could reflect  tumoral in for tray shin and/or edema. Diffuse lytic and sclerotic lesions throughout the visualized axial and appendicular skeleton, indicative of widespread metastatic disease to the bones. With appears very similar to the prior examination. There continue to be multiple nondisplaced to minimally displaced pathologic fractures throughout the ribs bilaterally. In addition, there is a new widely displaced fracture of the medial aspect of the right clavicle with approximately 1.4 cm of lateral and anterior displacement, as well as high attenuation thickening of the adjacent musculature in the upper right pectoralis major and lower right sternocleidomastoid muscles, most compatible with intramuscular hematoma and/or tumor. IMPRESSION: 1. Widespread metastatic disease to the bones redemonstrated, with multiple nondisplaced minimally displaced bilateral rib fractures, as well as a new widely displaced fracture of the medial aspect of the right clavicle with probable adjacent intramuscular hemorrhage in the lower right sternocleidomastoid muscle and upper right pectoralis major muscle. Probable tumoral infiltration throughout the right breast, as well as bilateral axillary and subpectoral lymphadenopathy (right greater than left). 2. Unusual appearance of the lungs, similar to the prior study, which could suggest chronic systemic disease such as sarcoidosis or interstitial lung disease. The possibility of lymphangitic spread of tumor in the lungs could be considered, but is not strongly favored at this time. 3. Moderate left (predominantly subpulmonic) pleural effusion and small right pleural effusion. 4. Cholelithiasis. 5. Aortic atherosclerosis, in addition to 2 vessel coronary artery disease. Aortic Atherosclerosis (ICD10-I70.0). Electronically Signed   By: Vinnie Langton M.D.   On: 11/17/2018 14:55   Ct Chest W Contrast  Result Date: 10/26/2018 CLINICAL DATA:  Shortness of breath, possible metastatic disease.  Breast cancer. EXAM: CT CHEST WITH CONTRAST TECHNIQUE: Multidetector CT imaging of  the chest was performed during intravenous contrast administration. CONTRAST:  76m OMNIPAQUE IOHEXOL 300 MG/ML  SOLN COMPARISON:  Chest x-ray earlier today FINDINGS: Cardiovascular: Mild cardiomegaly. Moderate coronary artery calcifications in the left anterior descending coronary artery. Aorta is normal caliber. Scattered atherosclerotic change. Mediastinum/Nodes: Numerous borderline sized and mildly enlarged mediastinal lymph nodes. Right paratracheal node measures 12 mm. Borderline size bilateral hilar lymph nodes, the largest on the right measuring 11 mm in short axis diameter. Numerous enlarged bilateral axillary lymph nodes with index left axillary lymph node having a short axis diameter of 2.1 cm on image 21 and right axillary lymph node having a short axis diameter of 1.4 cm on image 48. Lungs/Pleura: Moderate bilateral pleural effusions. Extensive interstitial disease throughout the lungs bilaterally. Mild reticulonodular densities. Cannot exclude lymphangitic spread of tumor. Upper Abdomen: Gallstones noted within the gallbladder. Musculoskeletal: Chest wall soft tissues are unremarkable. Extensive diffuse mixed lytic and sclerotic lesions throughout the visualized skeleton compatible with diffuse skeletal metastases. IMPRESSION: Bilateral axillary and mediastinal adenopathy. Borderline hilar lymph nodes bilaterally. Findings likely reflect metastatic lymphadenopathy. Moderate bilateral pleural effusions. Extensive interstitial thickening and reticulonodular opacities throughout the lungs. Cannot exclude lymphangitic spread of tumor. Diffuse skeletal metastases. Cholelithiasis. Coronary artery disease. Aortic Atherosclerosis (ICD10-I70.0). Electronically Signed   By: KRolm BaptiseM.D.   On: 10/26/2018 22:54   Nm Bone Scan Whole Body  Result Date: 10/27/2018 CLINICAL DATA:  Breast cancer staging. EXAM: NUCLEAR MEDICINE  WHOLE BODY BONE SCAN TECHNIQUE: Whole body anterior and posterior images were obtained approximately 3 hours after intravenous injection of radiopharmaceutical. RADIOPHARMACEUTICALS:  20.6 mCi Technetium-94mDP IV COMPARISON:  CT scan Oct 26, 2018 FINDINGS: Bony metastatic disease is seen in both of the femurs, numerous ribs, the manubrium, and right humerus. The widespread bony metastatic disease in the scapula, thoracic spine, sternum, and proximal humeri is better appreciated on the comparison CT scan. IMPRESSION: Widespread bony metastatic disease. The extent of metastatic disease is actually better appreciated on the recent CT imaging. This study is a near super scan consistent with widespread bony metastatic disease. Electronically Signed   By: DaDorise BullionII M.D   On: 10/27/2018 16:01   Dg Chest Port 1 View  Result Date: 11/20/2018 CLINICAL DATA:  Hypoxia, metastatic breast cancer EXAM: PORTABLE CHEST 1 VIEW COMPARISON:  Portable exam 1200 hours compared to 11/19/2018 FINDINGS: Very low lung volumes. Stable heart size mediastinal contours. BILATERAL pulmonary opacities again identified, unchanged. No new infiltrate, pleural effusion or pneumothorax. Scattered areas of osseous sclerosis consistent with osseous metastatic disease IMPRESSION: Widespread osseous metastatic disease. Persistent pulmonary opacities. Electronically Signed   By: MaLavonia Dana.D.   On: 11/20/2018 14:06   Dg Chest Port 1 View  Result Date: 11/19/2018 CLINICAL DATA:  Shortness of breath EXAM: PORTABLE CHEST 1 VIEW COMPARISON:  Yesterday FINDINGS: Very low volume chest with patchy bilateral infiltrate greater on the right. Some of these are streaky and suggestive of scarring or atelectasis. No visible effusion or pneumothorax. Largely obscured heart which is not enlarged by recent CT. Extensive osseous metastatic disease IMPRESSION: Stable low volume chest with atelectasis and interstitial opacity, reference chest CT from 2  days ago. Electronically Signed   By: JoMonte Fantasia.D.   On: 11/19/2018 08:54   Dg Chest Port 1 View  Result Date: 11/18/2018 CLINICAL DATA:  Shortness of breath and increased weakness this morning; history metastatic breast cancer EXAM: PORTABLE CHEST 1 VIEW COMPARISON:  Portable exam 0823 hours compared to 11/17/2018 FINDINGS: Very  low lung volumes. Normal heart size mediastinal contours. Patchy opacities in the lungs bilaterally favoring pneumonia over atelectasis. No pleural effusion or pneumothorax. Patchy osteosclerosis consistent with osseous metastatic disease. IMPRESSION: BILATERAL pulmonary opacities likely representing multifocal pneumonia. Electronically Signed   By: Lavonia Dana M.D.   On: 11/18/2018 09:30   Dg Chest Port 1 View  Result Date: 11/17/2018 CLINICAL DATA:  Shortness of breath.  Metastatic breast cancer. EXAM: PORTABLE CHEST 1 VIEW COMPARISON:  11/16/2018.  CT 10/26/2018. FINDINGS: Mediastinum stable. Heart size normal. Persistent left base atelectasis infiltrate. Right base atelectasis/infiltrate also noted on today's exam. Diffuse bilateral interstitial prominence again noted. Tiny left pleural effusion again noted. No pneumothorax. Multiple lytic lesions noted throughout the visualized bony structures. IMPRESSION: 1. Persistent left base atelectasis/infiltrate. Right base atelectasis/infiltrate also noted on today's exam. Small left pleural effusion again noted. 2. Diffuse bilateral interstitial prominence again noted consistent with interstitial tumor spread this patient with known metastatic disease. 3. Multiple lytic lesions are again noted throughout the visualized bony structures consistent with metastatic disease. Electronically Signed   By: Marcello Moores  Register   On: 11/17/2018 06:24   Dg Chest Port 1 View  Result Date: 11/16/2018 CLINICAL DATA:  Dyspnea, metastatic breast cancer EXAM: PORTABLE CHEST 1 VIEW COMPARISON:  Chest radiograph from one day prior. FINDINGS: Low  lung volumes. Stable cardiomediastinal silhouette with mild cardiomegaly. No pneumothorax. Stable small bilateral pleural effusions. Stable patchy left lung base consolidation. Stable thick irregular reticular opacities in the upper lungs. IMPRESSION: 1. Stable low lung volumes with small bilateral pleural effusions. 2. Stable thick irregular reticular opacities in the upper lungs and patchy left lung base consolidation. Findings likely representing combination of lymphangitic tumor (as seen on recent chest CT) and atelectasis. A component of mild pulmonary edema cannot be excluded. Electronically Signed   By: Ilona Sorrel M.D.   On: 11/16/2018 07:13   Dg Chest Port 1 View  Result Date: 10/28/2018 CLINICAL DATA:  Bone injury EXAM: PORTABLE CHEST 1 VIEW COMPARISON:  10/26/2018 FINDINGS: Diffuse widespread metastatic disease is again noted involving the osseous structures. There is an acute appearing fracture involving the fourth rib posterolaterally on the left. There are likely additional left-sided rib fractures that are suboptimally evaluated on this exam. The lung volumes are low. The cardiac silhouette is enlarged. There are growing bilateral pleural effusions and prominent bilateral interstitial lung markings. Bibasilar airspace opacities are noted. IMPRESSION: 1. Acute left-sided rib fracture involving the fourth rib posterolaterally. No pneumothorax. There are likely additional adjacent fractures that are suboptimally evaluated on this exam. 2. Low lung volumes. 3. Cardiomegaly with growing bilateral pleural effusions and prominent interstitial lung markings suggestive of progressive pulmonary edema. Electronically Signed   By: Constance Holster M.D.   On: 10/28/2018 01:55   Dg Chest Portable 1 View  Result Date: 10/26/2018 CLINICAL DATA:  Shortness of breath, possible right breast cancer EXAM: PORTABLE CHEST 1 VIEW COMPARISON:  None. FINDINGS: Low lung volumes. No pleural effusion. Diffuse  interstitial opacity. Heart size upper limits of normal. No pneumothorax. Diffuse sclerosis and lucent lesions within the clavicles, ribs and shoulders. IMPRESSION: 1. Low lung volumes without consolidation or effusion. Diffuse bilateral interstitial opacity of unknown chronicity. Findings could be secondary to interstitial inflammatory process, atypical/viral pneumonia, or possible metastatic disease given clinical history. CT chest suggested for further evaluation 2. Sclerotic and lytic lesions within the clavicles, bilateral ribs and shoulders, concerning for diffuse skeletal metastatic disease Electronically Signed   By: Madie Reno.D.  On: 10/26/2018 20:11   Dg Abd 2 Views  Result Date: 11/13/2018 CLINICAL DATA:  Pt c/o sob, weakness, cancer patient. EXAM: ABDOMEN - 2 VIEW COMPARISON:  None. FINDINGS: Overall visualized bowel gas pattern is nonobstructive. Moderate amount of stool and gas throughout the colon. No evidence of free intraperitoneal air seen. Diffuse osseous metastases, as previously described. IMPRESSION: 1. Nonobstructive bowel gas pattern. Moderate amount of stool and gas throughout the colon. 2. Diffuse osseous metastases. Electronically Signed   By: Franki Cabot M.D.   On: 11/13/2018 12:33   Vas [place] Lower Extremity Venous (dvt)  Result Date: 11/18/2018  Lower Venous Study Indications: Swelling.  Risk Factors: Cancer. Limitations: Body habitus and poor ultrasound/tissue interface. Comparison Study: No prior studies. Performing Technologist: Oliver Hum RVT  Examination Guidelines: A complete evaluation includes B-mode imaging, spectral Doppler, color Doppler, and power Doppler as needed of all accessible portions of each vessel. Bilateral testing is considered an integral part of a complete examination. Limited examinations for reoccurring indications may be performed as noted.  +---------+---------------+---------+-----------+----------+-------+ RIGHT     CompressibilityPhasicitySpontaneityPropertiesSummary +---------+---------------+---------+-----------+----------+-------+ CFV      Full           Yes      Yes                          +---------+---------------+---------+-----------+----------+-------+ SFJ      Full                                                 +---------+---------------+---------+-----------+----------+-------+ FV Prox  Full                                                 +---------+---------------+---------+-----------+----------+-------+ FV Mid   Full                                                 +---------+---------------+---------+-----------+----------+-------+ FV DistalFull                                                 +---------+---------------+---------+-----------+----------+-------+ PFV      Full                                                 +---------+---------------+---------+-----------+----------+-------+ POP      Full           Yes      Yes                          +---------+---------------+---------+-----------+----------+-------+ PTV      Full                                                 +---------+---------------+---------+-----------+----------+-------+  PERO     Full                                                 +---------+---------------+---------+-----------+----------+-------+   +---------+---------------+---------+-----------+----------+-------+ LEFT     CompressibilityPhasicitySpontaneityPropertiesSummary +---------+---------------+---------+-----------+----------+-------+ CFV      Full           Yes      Yes                          +---------+---------------+---------+-----------+----------+-------+ SFJ      Full                                                 +---------+---------------+---------+-----------+----------+-------+ FV Prox  Full                                                  +---------+---------------+---------+-----------+----------+-------+ FV Mid   Full                                                 +---------+---------------+---------+-----------+----------+-------+ FV DistalFull                                                 +---------+---------------+---------+-----------+----------+-------+ PFV      Full                                                 +---------+---------------+---------+-----------+----------+-------+ POP      Full           Yes      Yes                          +---------+---------------+---------+-----------+----------+-------+ PTV      Full                                                 +---------+---------------+---------+-----------+----------+-------+ PERO     Full                                                 +---------+---------------+---------+-----------+----------+-------+     Summary: Right: There is no evidence of deep vein thrombosis in the lower extremity. No cystic structure found in the popliteal fossa. Left: There is no evidence of deep vein thrombosis in the lower extremity. No cystic structure found in the popliteal fossa.  *See table(s) above for measurements  and observations. Electronically signed by Deitra Mayo MD on 11/18/2018 at 11:42:50 AM.    Final    Ir Thoracentesis Asp Pleural Space W/img Guide  Result Date: 10/29/2018 INDICATION: Patient with history of metastatic disease on imaging, dyspnea, and bilateral pleural effusions. Request is made for therapeutic left thoracentesis. EXAM: ULTRASOUND GUIDED THERAPEUTIC LEFT THORACENTESIS MEDICATIONS: 10 mL 1% lidocaine COMPLICATIONS: None immediate. PROCEDURE: An ultrasound guided thoracentesis was thoroughly discussed with the patient and questions answered. The benefits, risks, alternatives and complications were also discussed. The patient understands and wishes to proceed with the procedure. Written consent was obtained.  Ultrasound was performed to localize and mark an adequate pocket of fluid in the left chest. The area was then prepped and draped in the normal sterile fashion. 1% Lidocaine was used for local anesthesia. Under ultrasound guidance a 6 Fr Safe-T-Centesis catheter was introduced. Thoracentesis was performed. The catheter was removed and a dressing applied. FINDINGS: A total of approximately 350 mL of hazy gold fluid was removed. IMPRESSION: Successful ultrasound guided left thoracentesis yielding 350 mL of pleural fluid. Read by: Earley Abide, PA-C Electronically Signed   By: Jerilynn Mages.  Shick M.D.   On: 10/29/2018 15:58   Ir Thoracentesis Asp Pleural Space W/img Guide  Result Date: 10/28/2018 INDICATION: Patient with history of metastatic disease (including bone metastases) on imaging, dyspnea, and bilateral pleural effusions (R>L). Request is made for diagnostic and therapeutic right thoracentesis. EXAM: ULTRASOUND GUIDED DIAGNOSTIC AND THERAPEUTIC RIGHT THORACENTESIS MEDICATIONS: 10 mL 1% lidocaine COMPLICATIONS: None immediate. PROCEDURE: An ultrasound guided thoracentesis was thoroughly discussed with the patient and questions answered. The benefits, risks, alternatives and complications were also discussed. The patient understands and wishes to proceed with the procedure. Written consent was obtained. Ultrasound was performed to localize and mark an adequate pocket of fluid in the right chest. The area was then prepped and draped in the normal sterile fashion. 1% Lidocaine was used for local anesthesia. Under ultrasound guidance a 6 Fr Safe-T-Centesis catheter was introduced. Thoracentesis was performed. The catheter was removed and a dressing applied. FINDINGS: A total of approximately 600 mL of clear gold fluid was removed. Samples were sent to the laboratory as requested by the clinical team. IMPRESSION: Successful ultrasound guided right thoracentesis yielding 600 mL of pleural fluid. Read by: Earley Abide,  PA-C Electronically Signed   By: Jerilynn Mages.  Shick M.D.   On: 10/28/2018 16:10   US Thoracentesis Asp Pleural Space W/img Guide  Result Date: 11/15/2018 INDICATION: Patient with history of metastatic breast cancer, dyspnea, bilateral pleural effusions L>R. Request is made for therapeutic left thoracentesis. EXAM: ULTRASOUND GUIDED THERAPEUTIC LEFT THORACENTESIS MEDICATIONS: 10 mL 1% lidocaine COMPLICATIONS: None immediate. PROCEDURE: An ultrasound guided thoracentesis was thoroughly discussed with the patient and questions answered. The benefits, risks, alternatives and complications were also discussed. The patient understands and wishes to proceed with the procedure. Written consent was obtained. Ultrasound was performed to localize and mark an adequate pocket of fluid in the left chest. The area was then prepped and draped in the normal sterile fashion. 1% Lidocaine was used for local anesthesia. Under ultrasound guidance a 6 Fr Safe-T-Centesis catheter was introduced. Thoracentesis was performed. The catheter was removed and a dressing applied. FINDINGS: A total of approximately 400 mL of dark red fluid was removed. IMPRESSION: Successful ultrasound guided left thoracentesis yielding 400 mL of pleural fluid. Read by: Earley Abide, PA-C Electronically Signed   By: Markus Daft M.D.   On: 11/15/2018 14:32    ASSESSMENT: 58 y.o.  [place], [place] woman presenting 10/26/2018 with right-sided inflammatory breast cancer, stage IV, involving lungs, lymph nodes and bones, as follows:             (a) chest CT scan 10/26/2018 shows bilateral pleural effusions, possible lymphangitic spread of tumor, diffuse bony metastatic disease, and significant axillary mediastinal and hilar adenopathy             (b) bone scan 10/27/2018 is a "near [place]" consistent with widespread bony metastatic disease             (c) head CT with and without contrast 10/30/2018 shows no intracranial metastatic disease, multiple  calvarial lesions             (d) CA-27-29 on 10/27/2018 was 1810.4  (1) pleural fluid from right thoracentesis 10/28/2018 confirms malignant cells consistent with a breast primary, strongly estrogen and progesterone receptor positive, HER-2 not amplified, with an MIB-1 of 2%  (2) anastrozole started 10/29/2018             (a) palbociclib to start as soon as available             (b) denosumab/xgeva started 11/13/2018  (3) associated problems:             (a) hypoxia secondary to effusions             (b) pain from bone lesions             (c) right upper extremity lymphedema             (d) anemia, requiring transfusion             (e) poor venous access  (4) genetics testing pending  (5) hypocalcemia  (6) hypokalemia  (7) Anemia  (8) thrombocytopenia  (9) goals of care  PLAN:  Gracee has been moved out of the stepdown unit.  Her breathing is better today.  She does have nonproductive cough and is receiving cough medication for this.  She continues on anastrozole which she is tolerating well.  She is attempting to get her daughter to bring her [name] to the hospital so that she may start this.  PT has evaluated the patient.  Recommend that she get up to the hallway and ambulate.  Anticipate that she will be discharged to a skilled nursing facility.  Social work is following.  Hypocalcemia continues to improve.  Likely related to her recent Xgeva injection which was given on 11/13/2018.  Oral calcium will need to be continued as an outpatient.  The patient's hemoglobin is 8.2 which is stable.  She is not actively bleeding.  Recommend continued monitoring of her CBC and transfuse packed red blood cells if her hemoglobin is less than 7.0 or active bleeding.  No transfusion is indicated today.  She has mild thrombocytopenia likely related to her bone involvement of her cancer.  Recommend continued monitoring.  No transfusion is indicated.  Will follow with you   LOS: 11 days    Mikey Bussing, DNP, AGPCNP-BC, AOCNP 11/25/18    ADDENDUM: Nakiah is breathing a bit better and has a more positive attitude. From the cancer point of view she is tolerating anti-estrogens well and we are starting palbociclib. I would anticipate slow continuing improvement of her pulmonary symptoms over the next 2 months.  She has 6 steps to get into her house. She says it has been impossible for her to do that much. But she is more motivated and wants to work  with PT. Perhaps in 2 weeks or so a discharge home may be more likely successful.  Given the lack of insurance/medicaid or any resources it will not be easy to find placement. I appreciate the efforts of the team in this regard.  I personally saw this patient and performed a substantive portion of this encounter with the listed APP documented above.   Chauncey Cruel, MD Medical Oncology and Hematology [facility] [address] [place], [place] [address] Tel. [phone]    Fax. [phone]

## 2018-11-25 NOTE — TOC Progression Note (Signed)
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 Active w/HHC-[facility] HHRN/PT/OT-rep [facility] aware. PT-recc SNF vs HHC w/24hr supv;OT-recc HHC. 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 Awaiting bed offers, likely will need LOG-supv aware.      Expected Discharge Plan: Skilled Nursing Facility Barriers to Discharge: Continued Medical Work up  Expected Discharge Plan and Services Expected Discharge Plan: [place]   Discharge Planning Services: CM Consult   Living arrangements for the past 2 months: Single Family Home                                       Social Determinants of Health (SDOH) Interventions    Readmission Risk Interventions Readmission Risk Prevention Plan 11/17/2018  Transportation Screening Complete  PCP or Specialist Appt within 3-5 Days Not Complete  Not Complete comments will need pcp set up  [place] or [place] Complete  Social Work Consult for [place] Planning/Counseling Complete  Palliative Care Screening Not Complete  Palliative Care Screening Not Complete Comments pending- bedside RN states plan to consult palliative this admission  Medication Review (RN Care Manager) Complete  Some recent data might be hidden

## 2018-11-25 NOTE — NC FL2 (Signed)
[place] LEVEL OF CARE SCREENING TOOL     IDENTIFICATION  Patient Name: Suzanne [name] Birthdate: 1960/08/12 Sex: female Admission Date (Current Location): 11/14/2018  [facility] and [place] Number:  Herbalist and Address:  [facility],  [place], [place]      Provider Number: [id number]  Attending Physician Name and Address:  Eugenie Filler, MD  Relative Name and Phone Number:  Adelei Scobey [phone]    Current Level of Care: Hospital Recommended Level of Care: [place] Prior Approval Number:    Date Approved/Denied:   PASRR Number:    Discharge Plan: SNF    Current Diagnoses: Patient Active Problem List   Diagnosis Date Noted  . Pressure injury of skin 11/24/2018  . Acute on chronic respiratory failure with hypoxia ([place]) 11/19/2018  . Pleural effusion, malignant 11/19/2018  . Hypophosphatemia 11/19/2018  . Hypocalcemia 11/19/2018  . Aspiration pneumonia ([place]) 11/19/2018  . SOB (shortness of breath)   . Palliative care by specialist   . Carcinoma of breast, estrogen receptor positive, stage 4, right ([place]) 11/16/2018  . Hypoalbuminemia 11/16/2018  . Pleural effusion 11/14/2018  . Goals of care, counseling/discussion 11/06/2018  . Malignant neoplasm of overlapping sites of right breast in female, estrogen receptor positive ([place]) 10/31/2018  . Lung metastasis ([place]) 10/31/2018  . Pain from bone metastases ([place]) 10/31/2018  . Morbid obesity with BMI of 40.0-44.9, adult ([place]) 10/31/2018  . Bone injury   . Pleural effusion, bilateral   . Shortness of breath   . Abnormal breast finding   . Hypokalemia   . Breast skin changes   . Anemia   . Bone metastasis ([place])   . Acute anemia 10/26/2018    Orientation RESPIRATION BLADDER Height & Weight     Self, Time, Situation, Place  O2(02 2l [place] continuous) Continent Weight: 87 kg Height:  5\' 2"  (157.5 cm)  BEHAVIORAL SYMPTOMS/MOOD  NEUROLOGICAL BOWEL NUTRITION STATUS      Continent Diet(Heart Healthy)  AMBULATORY STATUS COMMUNICATION OF NEEDS Skin   Limited Assist Verbally Normal                       Personal Care Assistance Level of Assistance  Bathing, Feeding, Dressing Bathing Assistance: Limited assistance Feeding assistance: Limited assistance Dressing Assistance: Limited assistance     Functional Limitations Info  Sight Sight Info: Impaired(eyeglasses)        SPECIAL CARE FACTORS FREQUENCY  PT (By licensed PT), OT (By licensed OT)     PT Frequency: 5x week OT Frequency: 5x week            Contractures      Additional Factors Info  Code Status Code Status Info: Full code             Current Medications (11/25/2018):  This is the current hospital active medication list Current Facility-Administered Medications  Medication Dose Route Frequency Provider Last Rate Last Dose  . 0.9 %  sodium chloride infusion   Intravenous PRN Eugenie Filler, MD   Stopped at 11/16/18 1407  . acetaminophen (TYLENOL) tablet 650 mg  650 mg Oral Q6H PRN Eugenie Filler, MD   650 mg at 11/17/18 1200   Or  . acetaminophen (TYLENOL) suppository 650 mg  650 mg Rectal Q6H PRN Eugenie Filler, MD      . anastrozole (ARIMIDEX) tablet 1 mg  1 mg Oral Daily Eugenie Filler,  MD   1 mg at 11/25/18 0906  . bisacodyl (DULCOLAX) EC tablet 5 mg  5 mg Oral Daily PRN Eugenie Filler, MD      . calcium-vitamin D (OSCAL WITH D) 500-200 MG-UNIT per tablet 1 tablet  1 tablet Oral TID Eugenie Filler, MD   1 tablet at 11/25/18 [id number]  . carvedilol (COREG) tablet 6.25 mg  6.25 mg Oral BID WC Eugenie Filler, MD   6.25 mg at 11/25/18 0755  . Chlorhexidine Gluconate Cloth 2 % PADS 6 each  6 each Topical Daily Eugenie Filler, MD   6 each at 11/24/18 0831  . clotrimazole (LOTRIMIN) 1 % cream   Topical BID PRN Eugenie Filler, MD   1 application at 16/38/45 0315  . enoxaparin (LOVENOX) injection 40 mg  40  mg Subcutaneous Daily Eugenie Filler, MD   40 mg at 11/25/18 3646  . feeding supplement (ENSURE ENLIVE) (ENSURE ENLIVE) liquid 237 mL  237 mL Oral TID BM Eugenie Filler, MD   237 mL at 11/25/18 0908  . furosemide (LASIX) tablet 40 mg  40 mg Oral BID Eugenie Filler, MD      . guaiFENesin-dextromethorphan [facility] DM) 100-10 MG/5ML syrup 5 mL  5 mL Oral Q4H PRN Eugenie Filler, MD   5 mL at 11/25/18 1325  . levalbuterol (XOPENEX) nebulizer solution 0.63 mg  0.63 mg Nebulization Q6H PRN Eugenie Filler, MD      . loperamide (IMODIUM) capsule 2 mg  2 mg Oral PRN Eugenie Filler, MD      . LORazepam (ATIVAN) injection 0.5 mg  0.5 mg Intravenous Q6H PRN Eugenie Filler, MD   0.5 mg at 11/23/18 2326  . MEDLINE mouth rinse  15 mL Mouth Rinse q12n4p Eugenie Filler, MD   15 mL at 11/24/18 1114  . morphine 2 MG/ML injection 1 mg  1 mg Intravenous Q4H PRN Eugenie Filler, MD   1 mg at 11/19/18 2229  . ondansetron (ZOFRAN) tablet 4 mg  4 mg Oral Q6H PRN Eugenie Filler, MD       Or  . ondansetron [facility]) injection 4 mg  4 mg Intravenous Q6H PRN Eugenie Filler, MD      . oxyCODONE (Oxy IR/ROXICODONE) immediate release tablet 2.5 mg  2.5 mg Oral Q4H PRN Eugenie Filler, MD   2.5 mg at 11/23/18 0006  . palbociclib (IBRANCE) capsule 125 mg  125 mg Oral Q breakfast Eugenie Filler, MD   125 mg at 11/25/18 0755  . polyethylene glycol (MIRALAX / GLYCOLAX) packet 17 g  17 g Oral Daily PRN Eugenie Filler, MD      . potassium chloride SA (K-DUR) CR tablet 40 mEq  40 mEq Oral Daily Eugenie Filler, MD   40 mEq at 11/25/18 0905  . senna-docusate (Senokot-S) tablet 1 tablet  1 tablet Oral QHS Eugenie Filler, MD   1 tablet at 11/24/18 2052     Discharge Medications: Please see discharge summary for a list of discharge medications.  Relevant Imaging Results:  Relevant Lab Results:   Additional Information ss#[id number] [place], [place]

## 2018-11-25 NOTE — Progress Notes (Signed)
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  Patient admitted underwent thoracentesis of the right and left lungs on 6/2 and 6/3 and scheduled for repeat 3 days later. Patient was admitted respiratory status worsened and patient placed on the BiPAP and transferred to the stepdown unit.  Patient also placed on IV diuretics.  Patient noted to spike a fever and as such also placed on IV antibiotics for presumed aspiration pneumonia.  Due to tenuous respiratory status critical care consulted. Patient underwent left-sided thoracentesis on 11/15/2018 with 400 cc of back red pleural fluid removed.  Cytology from thoracentesis of 10/28/2018+ for malignant cells consistent with breast cancer.  COVID-19 negative.  IV antibiotics narrowed down to oral Augmentin starting 11/19/2018.  Lower extremity Dopplers which were done were negative.  Patient now on intermittent BiPAP mainly at night and on 3 L nasal cannula in the daytime however still with use of accessory muscles of respiration with poor air movement.  Palliative care consulted for goals of care.  Oncology following as well.   Assessment & Plan:   Principal Problem:   Acute on chronic respiratory failure with hypoxia (HCC) Active Problems:   Shortness of breath   Hypokalemia   Anemia   Bone metastasis (HCC)   Pleural effusion, bilateral   Malignant neoplasm of overlapping sites of right breast in female, estrogen receptor positive ([place])   Morbid obesity with BMI of 40.0-44.9, adult (HCC)   Carcinoma of  breast, estrogen receptor positive, stage 4, right (HCC)   Hypoalbuminemia   Palliative care by specialist   Pleural effusion, malignant   Hypophosphatemia   Hypocalcemia   Aspiration pneumonia (HCC)   SOB (shortness of breath)   Pressure injury of skin  1 acute on chronic hypoxic respiratory failure Felt likely multifactorial secondary to malignant pleural effusions, metastatic stage IV breast cancer with malignant effusion, possible aspiration pneumonia, volume overload. Patient was requiring noninvasive BiPAP on and off and wearing it intermittently.  Patient noted to be refusing BiPAP over the 3-4 nights while in the stepdown unit.  Currently on 2-3 L nasal cannula.  Patient status post thoracentesis on the left 11/23/2018 with 400 cc of dark red pleural fluid removed.  Last chest x-ray done (6/24) was stable low volume chest with atelectasis and interstitial opacity.  CT chest done on 11/17/2018 with widespread metastatic disease to the bones redemonstrated with multiple nondisplaced minimally displaced bilateral rib fractures as well as new widely displaced fracture of the medial aspect of the right clavicle with probable adjacent intramuscular hemorrhage in the lower right sternocleidomastoid muscle and upper right pectoralis major muscle.  Probable tumor infiltration throughout the right breast as well as bilateral axillary and soft pectoral lymphadenopathy.  Unusual appearance of the lungs similar to prior study could suggest chronic systemic disease such as sarcoidosis or interstitial lung disease.  Possibility of lymphangitic spread of tumor in the lungs could be considered but not strongly favored.  Moderate left pleural effusion and small right pleural effusion.  Cholelithiasis.  Lower extremity Dopplers negative for DVT.  COVID-19 test negative.  Patient did  have increased O2 requirements and shortness of breath and use of accessory muscles of respiration over the past few days which is  slowly improving with diuresis and antibiotics.  Patient was on IV Lasix with good diuresis and good clinical improvement.  Patient is -13.336 L during this hospitalization.Current weight of 191.8 pounds from 189.6pounds from 191.14 pounds from 198.19 pounds from 204.15 pounds from 210 pounds from 233.69 pounds on admission.  Patient was empirically on IV antibiotics of cefepime and subsequently Zosyn and has been transitioned to Augmentin per PCCM.  Continue pulmonary hygiene.  Continue scheduled Xopenex and Atrovent nebs.  Patient was transitioned from IV Lasix to oral Lasix 40 mg daily which was increased to 40 mg twice daily.  Patient noted to have some diffuse crackles on examination.  We will give a dose of 40 mg IV Lasix x1.  Strict I's and O's.  Daily weights.  Follow.   2.  Malignant pleural effusion/loculated pleural effusion/right-sided inflammatory stage IV breast cancer involving lungs, lymph nodes, bones Status post thoracentesis on the left with 400 cc of dark red pleural fluid removed.  Cytology from thoracentesis 10/28/2018+ for malignant cells consistent with breast cancer.  Patient may require Pleurx catheter if recurrence of effusion however not at this time per PCCM.  Patient with extensive progressive disease.  Oncology and palliative care following.  Continue Arimidex.  Oncology started on patient on palbociclib today 11/25/2018.  Oncology following.  3.  Hypocalcemia Likely secondary to recent Xgeva injection given on 11/13/2018.  Corrected calcium at 8.10.  Increased oral calcium supplementation to 3 times daily.  Patient status post IV calcium gluconate on 11/18/2018.  Patient was been ordered IV calcium gluconate 1 g IV x3 doses on 11/21/2018.  Status post IV calcium gluconate 2 g IV on 11/22/2018 and 11/23/2018.  We will give IV calcium gluconate 2 g IV x1 today.  Keep magnesium greater than 2.  Follow.  4.  Hypokalemia Likely secondary to diuresis.  Potassium currently at 3.8.   Magnesium at 2.1.  Continue daily oral potassium supplementation.  Follow.   5.  Bilateral lower extremity rash on feet and legs Unclear etiology.  Patient with some mild petechia which is slowly improving.  May be secondary to transfusion obtained prior to admission.  Follow for now.  6.  Probable aspiration pneumonia Patient noted to be febrile during this hospitalization.  Fever curve trended down.  MRSA PCR negative.  Patient was on IV cefepime subsequently transitioned to IV Zosyn which have been discontinued.  Patient transitioned to oral Augmentin for 3 more days which she has completed.  No further antibiotics needed.   7.  Hypophosphatemia Repleted.  8.  Microcytic anemia Patient received transfusion prior to admission.  Patient with no signs or symptoms of bleeding.  Hemoglobin at 8.2.  Follow.  Transfusion threshold hemoglobin less than 7.  Oncology following.  9.  Volume overload in the setting of blood transfusion versus suspected acute diastolic CHF Patient noted to be short of breath posttransfusion.  Patient given IV Lasix during the hospitalization and was on 40 IV every 12 hours down from 80 IV every 12 hours.  2D echo from 10/27/2018 with a EF of 60 to 74%, diastolic Doppler parameters indeterminate.  Patient with a urine output not properly recorded over the past 24 hours. Patient is  - 13.336 L during this hospitalization. Lasix increased to 60 mg IV every 8 hours on 11/20/2018.  Patient's current weight is 191.8 pounds from 189.6  pounds from 191.14 pounds from 198.19 pounds from 204.15 pounds from 210.1 pounds from 233.69 pounds on admission.  DiscontinueD IV Lasix and transitioned to oral Lasix 40 mg daily daily.  Patient with some diffuse crackles on examination.  Urine output not correctly recorded.  Weight slightly up from yesterday.  Patient however on 2 to 3 L nasal cannula.  Increase oral Lasix to 40 mg twice daily.  We will give a dose of Lasix 40 mg IV x1 today.  Strict  I's and O's.  Daily weights.   10.  Obesity  11.  Diarrhea Patient with loose stools which are improving after being given dose of Imodium on 11/20/2018.  Rectal pouch has been discontinued.  No further diarrhea.  Follow.  12.  Sinus tachycardia Likely secondary to volume overload and problem #1.  TSH of 4.251.  Improving.  Continue current regimen of Coreg 6.25 mg twice daily.    DVT prophylaxis: Lovenox. Code Status: Full Family Communication: Updated patient.  No family at bedside. Disposition Plan: Skilled nursing facility when bed available and if continued improvement hopefully in the next 24 to 48 hours.     Consultants:   PCCM: Dr. Carson Myrtle 11/16/2018  Palliative care: Dr. Rowe Pavy 11/18/2018  Allergy: Dr. Jana Hakim 11/17/2018  Procedures:  6/02 Right Pleural Fluid Cytology >> positive for malignant cells c/w breast cancer  6/20 Left Thoracentesis >> 400 ml dark red pleural fluid removed  11/17/2018 lower extremity Dopplers Chest x-ray: 11/14/2018, 11/15/2018, 11/16/2018, 11/17/2018, 11/18/2018, 11/19/2018 CT chest 11/17/2018    Antimicrobials:  Cefepime 6/21  Zosyn 6/22-6/24 Augmentin 6/24 (for 3 days)>>>> 11/22/2018  Micro Data:  COVID 6/19 >> negative     Subjective: Patient sleeping however easily arousable.  Dullness of breath improving.  No chest pain.  Currently on 2 to 3 L nasal cannula.  Denies any abdominal pain.  No loose stools.   Objective: Vitals:   11/24/18 1925 11/24/18 2119 11/25/18 0539 11/25/18 0800  BP:  133/74 127/88   Pulse:  100 99   Resp:  16 17   Temp:  98.4 F (36.9 C) 97.8 F (36.6 C)   TempSrc:  Oral Oral   SpO2: 97% 94% 96%   Weight:    87 kg  Height:        Intake/Output Summary (Last 24 hours) at 11/25/2018 1206 Last data filed at 11/25/2018 1030 Gross per 24 hour  Intake 997 ml  Output --  Net 997 ml   Filed Weights   11/23/18 0500 11/24/18 0500 11/25/18 0800  Weight: 86.7 kg 86 kg 87 kg    Examination:  General exam:  NAD Respiratory system: Fair air movement.  Some diffuse coarse breath sounds/crackles noted.  No wheezing.  No rhonchi.  Speaking in full sentences.  No use of accessory muscles of respiration.  Cardiovascular system: Tachycardia.  No JVD, murmurs, rubs, gallops or clicks.  Trace bilateral lower extremity edema.  Gastrointestinal system: Abdomen is soft, nontender, nondistended, positive bowel sounds.  No rebound.  No guarding.  Central nervous system: Alert.  Oriented.  Following commands appropriately.  Moving extremities spontaneously.  No focal neurological deficits.  Extremities: Trace bilateral lower extremity edema.  Symmetric 5 x 5 power. Skin: Slowly improving petechial rash on bilateral feet and distal lower extremities. Psychiatry: Judgement and insight appear normal. Mood & affect appropriate.     Data Reviewed: I have personally reviewed following labs and imaging studies  CBC: Recent Labs  Lab 11/19/18 0231 11/20/18 0223 11/21/18 0222  11/22/18 0224 11/23/18 0258 11/24/18 0247 11/25/18 0517  WBC 8.0 9.1 8.9 9.6 10.5 10.4 12.1*  NEUTROABS 5.1 5.5  --  5.1 5.7 5.9  --   HGB 8.1* 8.0* 8.6* 8.5* 8.6* 8.2* 8.2*  HCT 26.5* 26.7* 28.8* 28.3* 29.2* 27.4* 26.4*  MCV 103.9* 104.3* 106.3* 104.4* 105.8* 104.6* 102.3*  PLT 96* 97* 97* 103* 112* 110* 240*   Basic Metabolic Panel: Recent Labs  Lab 11/19/18 0231  11/20/18 0223 11/21/18 0222 11/22/18 0224 11/23/18 0258 11/24/18 0247 11/25/18 0517  NA 137   < > 139 141 138 136 134* 134*  K 2.6*   < > 3.1* 3.4* 3.9 3.8 3.9 3.8  CL 92*   < > 93* 94* 94* 92* 92* 92*  CO2 31   < > 33* 35* 32 32 29 29  GLUCOSE 114*   < > 156* 131* 131* 138* 133* 139*  BUN 13   < > 12 13 16 17  21* 22*  CREATININE 0.71   < > 0.58 0.60 0.66 0.72 0.75 0.80  CALCIUM 5.1*   < > 5.1* 5.8* 6.3* 6.8* 6.7* 6.9*  MG 1.9  --  2.0 1.9 2.0  --   --  2.1  PHOS 1.8*  --  2.5  --   --   --   --   --    < > = values in this interval not displayed.    GFR: Estimated Creatinine Clearance: 77.6 mL/min (by C-G formula based on SCr of 0.8 mg/dL). Liver Function Tests: Recent Labs  Lab 11/19/18 0231 11/25/18 0517  AST 22 37  ALT 14 31  ALKPHOS 194* 190*  BILITOT 0.8 1.0  PROT 5.7* 7.1  ALBUMIN 2.3* 2.5*   No results for input(s): LIPASE, AMYLASE in the last 168 hours. No results for input(s): AMMONIA in the last 168 hours. Coagulation Profile: No results for input(s): INR, PROTIME in the last 168 hours. Cardiac Enzymes: No results for input(s): CKTOTAL, CKMB, CKMBINDEX, TROPONINI in the last 168 hours. BNP (last 3 results) No results for input(s): PROBNP in the last 8760 hours. HbA1C: No results for input(s): HGBA1C in the last 72 hours. CBG: Recent Labs  Lab 11/20/18 2121 11/22/18 1639  GLUCAP 140* 161*   Lipid Profile: No results for input(s): CHOL, HDL, LDLCALC, TRIG, CHOLHDL, LDLDIRECT in the last 72 hours. Thyroid Function Tests: No results for input(s): TSH, T4TOTAL, FREET4, T3FREE, THYROIDAB in the last 72 hours. Anemia Panel: No results for input(s): VITAMINB12, FOLATE, FERRITIN, TIBC, IRON, RETICCTPCT in the last 72 hours. Sepsis Labs: No results for input(s): PROCALCITON, LATICACIDVEN in the last 168 hours.  Recent Results (from the past 240 hour(s))  MRSA PCR Screening     Status: None   Collection Time: 11/16/18  8:02 AM   Specimen: Nasal Mucosa; Nasopharyngeal  Result Value Ref Range Status   MRSA by PCR NEGATIVE NEGATIVE Final    Comment:        The GeneXpert MRSA Assay (FDA approved for NASAL specimens only), is one component of a comprehensive MRSA colonization surveillance program. It is not intended to diagnose MRSA infection nor to guide or monitor treatment for MRSA infections. Performed at [facility], [place] [address], [place], [place] [address]          Radiology Studies: No results found.      Scheduled Meds:  anastrozole  1 mg Oral Daily    calcium-vitamin D  1 tablet Oral TID   carvedilol  6.25 mg Oral BID WC   Chlorhexidine Gluconate Cloth  6 each Topical Daily   enoxaparin (LOVENOX) injection  40 mg Subcutaneous Daily   feeding supplement (ENSURE ENLIVE)  237 mL Oral TID BM   furosemide  40 mg Oral Daily   mouth rinse  15 mL Mouth Rinse q12n4p   palbociclib  125 mg Oral Q breakfast   potassium chloride  40 mEq Oral Daily   senna-docusate  1 tablet Oral QHS   Continuous Infusions:  sodium chloride Stopped (11/16/18 1407)     LOS: 11 days    Time spent: 40 minutes    Irine Seal, MD [facility]  If 7PM-7AM, please contact night-coverage [email or web address] 11/25/2018, 12:06 PM

## 2018-11-26 ENCOUNTER — Inpatient Hospital Stay: Payer: Medicaid Other | Admitting: Adult Health

## 2018-11-26 ENCOUNTER — Inpatient Hospital Stay: Payer: Medicaid Other | Attending: Adult Health

## 2018-11-26 DIAGNOSIS — C7951 Secondary malignant neoplasm of bone: Secondary | ICD-10-CM | POA: Insufficient documentation

## 2018-11-26 DIAGNOSIS — Z79811 Long term (current) use of aromatase inhibitors: Secondary | ICD-10-CM | POA: Insufficient documentation

## 2018-11-26 DIAGNOSIS — Z170 Estrogen receptor positive status [ER+]: Secondary | ICD-10-CM | POA: Insufficient documentation

## 2018-11-26 DIAGNOSIS — C50811 Malignant neoplasm of overlapping sites of right female breast: Secondary | ICD-10-CM | POA: Insufficient documentation

## 2018-11-26 DIAGNOSIS — C7801 Secondary malignant neoplasm of right lung: Secondary | ICD-10-CM | POA: Insufficient documentation

## 2018-11-26 DIAGNOSIS — D649 Anemia, unspecified: Secondary | ICD-10-CM | POA: Insufficient documentation

## 2018-11-26 DIAGNOSIS — J910 Malignant pleural effusion: Secondary | ICD-10-CM | POA: Insufficient documentation

## 2018-11-26 DIAGNOSIS — G893 Neoplasm related pain (acute) (chronic): Secondary | ICD-10-CM | POA: Insufficient documentation

## 2018-11-26 LAB — BASIC METABOLIC PANEL
Anion gap: 11 (ref 5–15)
BUN: 23 mg/dL — ABNORMAL HIGH (ref 6–20)
CO2: 30 mmol/L (ref 22–32)
Calcium: 6.6 mg/dL — ABNORMAL LOW (ref 8.9–10.3)
Chloride: 92 mmol/L — ABNORMAL LOW (ref 98–111)
Creatinine, Ser: 0.9 mg/dL (ref 0.44–1.00)
GFR calc Af Amer: >60 mL/min (ref >60)
GFR calc non Af Amer: >60 mL/min (ref >60)
Glucose, Bld: 132 mg/dL — ABNORMAL HIGH (ref 70–99)
Potassium: 4.2 mmol/L (ref 3.5–5.1)
Sodium: 133 mmol/L — ABNORMAL LOW (ref 135–145)

## 2018-11-26 LAB — CBC WITH DIFFERENTIAL/PLATELET
Abs Immature Granulocytes: 0.32 10*3/uL — ABNORMAL HIGH (ref 0.00–0.07)
Basophils Absolute: 0.1 10*3/uL (ref 0.0–0.1)
Basophils Relative: 1 %
Eosinophils Absolute: 0.1 10*3/uL (ref 0.0–0.5)
Eosinophils Relative: 1 %
HCT: 25.7 % — ABNORMAL LOW (ref 36.0–46.0)
Hemoglobin: 7.8 g/dL — ABNORMAL LOW (ref 12.0–15.0)
Immature Granulocytes: 3 %
Lymphocytes Relative: 31 %
Lymphs Abs: 3.2 10*3/uL (ref 0.7–4.0)
MCH: 31.5 pg (ref 26.0–34.0)
MCHC: 30.4 g/dL (ref 30.0–36.0)
MCV: 103.6 fL — ABNORMAL HIGH (ref 80.0–100.0)
Monocytes Absolute: 0.5 10*3/uL (ref 0.1–1.0)
Monocytes Relative: 5 %
Neutro Abs: 6 10*3/uL (ref 1.7–7.7)
Neutrophils Relative %: 59 %
Platelets: 129 10*3/uL — ABNORMAL LOW (ref 150–400)
RBC: 2.48 MIL/uL — ABNORMAL LOW (ref 3.87–5.11)
RDW: 16.9 % — ABNORMAL HIGH (ref 11.5–15.5)
WBC: 10.3 10*3/uL (ref 4.0–10.5)
nRBC: 1.1 % — ABNORMAL HIGH (ref 0.0–0.2)

## 2018-11-26 LAB — MAGNESIUM: Magnesium: 2.1 mg/dL (ref 1.7–2.4)

## 2018-11-26 MED ORDER — CALCIUM GLUCONATE-NACL 2-0.675 GM/100ML-% IV SOLN
2.0000 g | Freq: Once | INTRAVENOUS | Status: AC
  Administered 2018-11-26: 2000 mg via INTRAVENOUS
  Filled 2018-11-26: qty 100

## 2018-11-26 NOTE — Progress Notes (Signed)
HEMATOLOGY-ONCOLOGY PROGRESS NOTE  SUBJECTIVE: Continues to feel better. Has not been ambulating in hallway. Breathing better today. Remains on O2. Has started on her home supply of Ibrance. This was started on 11/24/2018.   REVIEW OF SYSTEMS:   Constitutional: Denies fevers, chills Eyes: Denies blurriness of vision Ears, nose, mouth, throat, and face: Denies mucositis or sore throat Respiratory: Shortness of breath improved. Cardiovascular: Denies palpitation, chest discomfort Gastrointestinal:  Denies nausea, heartburn or change in bowel habits Skin: Rash to bilateral feet has resolved. Lymphatics: Denies new lymphadenopathy or easy bruising Neurological:Denies numbness, tingling or new weaknesses Behavioral/Psych: Reports being tearful.   Extremities: Reports that the swelling in her lower extremities has improved. All other systems were reviewed with the patient and are negative.  I have reviewed the past medical history, past surgical history, social history and family history with the patient and they are unchanged from previous note.   PHYSICAL EXAMINATION:  Vitals:   11/25/18 2050 11/26/18 0531  BP: 124/71 126/78  Pulse: 90 95  Resp: 16 20  Temp: 98.1 F (36.7 C) 98.3 F (36.8 C)  SpO2: 96% 95%   Filed Weights   11/24/18 0500 11/25/18 0800 11/26/18 0531  Weight: 189 lb 9.5 oz (86 kg) 191 lb 12.8 oz (87 kg) 190 lb 1.6 oz (86.2 kg)    Intake/Output from previous day: 06/30 0701 - 07/01 0700 In: 1297 [P.O.:1297] Out: 1050 [Urine:1050]  GENERAL:alert, distress SKIN: No rashes or open areas. EYES: normal, Conjunctiva are pink and non-injected, sclera clear OROPHARYNX:no exudate, no erythema and lips, buccal mucosa, and tongue normal  NECK: supple, thyroid normal size, non-tender, without nodularity LYMPH:  no palpable lymphadenopathy in the cervical, axillary or inguinal LUNGS: Clear HEART: regular rate & rhythm and no murmurs.  No lower extremity  edema. ABDOMEN:abdomen soft, non-tender and normal bowel sounds Musculoskeletal:no cyanosis of digits and no clubbing  NEURO: alert & oriented x 3 with fluent speech, no focal motor/sensory deficits.  LABORATORY DATA:  I have reviewed the data as listed CMP Latest Ref Rng & Units 11/26/2018 11/25/2018 11/24/2018  Glucose 70 - 99 mg/dL 132(H) 139(H) 133(H)  BUN 6 - 20 mg/dL 23(H) 22(H) 21(H)  Creatinine 0.44 - 1.00 mg/dL 0.90 0.80 0.75  Sodium 135 - 145 mmol/L 133(L) 134(L) 134(L)  Potassium 3.5 - 5.1 mmol/L 4.2 3.8 3.9  Chloride 98 - 111 mmol/L 92(L) 92(L) 92(L)  CO2 22 - 32 mmol/L _0 Calcium 8.9 - 10.3 mg/dL 6.6(L) 6.9(L) 6.7(L)  Total Protein 6.5 - 8.1 g/dL - 7.1 -  Total Bilirubin 0.3 - 1.2 mg/dL - 1.0 -  Alkaline Phos 38 - 126 U/L - 190(H) -  AST 15 - 41 U/L - 37 -  ALT 0 - 44 U/L - 31 -    Lab Results  Component Value Date   WBC 10.3 11/26/2018   HGB 7.8 (L) 11/26/2018   HCT 25.7 (L) 11/26/2018   MCV 103.6 (H) 11/26/2018   PLT 129 (L) 11/26/2018   NEUTROABS 6.0 11/26/2018    Dg Chest 1 View  Result Date: 11/15/2018 CLINICAL DATA:  Post left-sided thoracentesis. EXAM: CHEST  1 VIEW COMPARISON:  11/14/2018 FINDINGS: Lungs are hypoinflated demonstrate interval improvement with moderate residual left-sided pleural effusion likely with associated basilar atelectasis. No pneumothorax. Mild prominence of the perihilar markings without significant change. Cardiomediastinal silhouette and remainder of the exam is unchanged. IMPRESSION: Hypoinflation with interval improvement in left pleural effusion post thoracentesis. No pneumothorax. Moderate residual left effusion likely  with associated basilar atelectasis. Prominence of the perihilar markings suggesting vascular congestion. Electronically Signed   By: Marin Olp M.D.   On: 11/15/2018 13:58   Dg Chest 1 View  Result Date: 10/29/2018 CLINICAL DATA:  Post LEFT thoracentesis EXAM: CHEST  1 VIEW COMPARISON:  622 in 20 FINDINGS:  Upper normal heart size. Stable mediastinal contours. BILATERAL upper lobe scarring. Improved aeration at bases since prior study. Minimal residual LEFT pleural effusion without pneumothorax. Bones demineralized. IMPRESSION: Extensive chronic lung changes. No pneumothorax following LEFT thoracentesis. Electronically Signed   By: Lavonia Dana M.D.   On: 10/29/2018 15:50   Dg Chest 1 View  Result Date: 10/28/2018 CLINICAL DATA:  Post right thoracentesis EXAM: CHEST  1 VIEW COMPARISON:  10/28/2018 FINDINGS: Very low lung volumes again noted. Interstitial prominence within the lungs. Heart is borderline in size with vascular congestion. No pneumothorax following right thoracentesis. Diffuse skeletal metastases again noted. IMPRESSION: Very low lung volumes with cardiomegaly, vascular congestion and interstitial prominence. No pneumothorax following thoracentesis. Electronically Signed   By: Rolm Baptise M.D.   On: 10/28/2018 16:00   Dg Chest 2 View  Result Date: 11/14/2018 CLINICAL DATA:  Stage IV breast cancer.  Shortness of breath. EXAM: CHEST - 2 VIEW COMPARISON:  Radiographs 11/13/2018 and 10/29/2018.  CT 10/26/2018. FINDINGS: The heart size and mediastinal contours are stable. Left pleural effusion and left basilar opacity are unchanged from the recent study. Diffusely increased interstitial markings are again noted throughout both lungs, remaining suspicious for lymphangitic spread of tumor based on previous CT. No progressive airspace opacities identified. Diffuse osseous metastatic disease noted without evidence of acute fracture. IMPRESSION: Stable radiographic appearance of the chest since yesterday. A left pleural effusion and left basilar pulmonary opacity have increased from older prior studies. Evidence of extensive metastatic disease again noted. Electronically Signed   By: Richardean Sale M.D.   On: 11/14/2018 17:10   Dg Chest 2 View  Result Date: 11/13/2018 CLINICAL DATA:  Shortness of breath,  weakness. Cancer patient. EXAM: CHEST - 2 VIEW COMPARISON:  Chest x-ray dated 10/29/2018. Chest CT dated 10/26/2018. FINDINGS: Heart size and mediastinal contours appear stable. Increased opacity at the LEFT lung base, likely increased pleural effusion. There is also central pulmonary vascular congestion and bilateral perihilar edema. No pneumothorax seen. Diffuse osseous metastases, as described on previous CT report. IMPRESSION: 1. Increased opacity at the LEFT lung base, likely increased pleural effusion. Superimposed pneumonia or airspace collapse not excluded. 2. Central pulmonary vascular congestion and bilateral perihilar edema indicating CHF/volume overload. 3. Diffuse osseous metastases. Electronically Signed   By: Franki Cabot M.D.   On: 11/13/2018 12:32   Ct Head W & Wo Contrast  Result Date: 10/30/2018 CLINICAL DATA:  Metastatic breast cancer. No neurologic symptoms are reported. EXAM: CT HEAD WITHOUT AND WITH CONTRAST TECHNIQUE: Contiguous axial images were obtained from the base of the skull through the vertex without and with intravenous contrast CONTRAST:  5m ISOVUE-300 IOPAMIDOL (ISOVUE-300) INJECTION 61% COMPARISON:  Nuclear medicine whole-body bone scan, 10/27/2018 FINDINGS: Brain: No acute stroke, hemorrhage, mass lesion or hydrocephalus. Mild atrophy. No extra-axial fluid. Post infusion, no abnormal enhancement of the brain or visible meninges. Vascular: No hyperdense vessel or unexpected calcification. Visible vessels are patent. Skull: Widespread metastatic disease to the calvarium, skull base, and visible cervical spine. Metastatic disease to the mandible is also present. There is destruction of the inner and outer table of the skull in multiple locations. Epidural tumor is not established. Sinuses/Orbits: No  acute finding. Other: None. IMPRESSION: 1. Widespread osseous metastatic disease to the calvarium, skull base, and visible cervical spine. Epidural tumor not established. 2. No  intracranial metastatic disease is evident. Electronically Signed   By: Staci Righter M.D.   On: 10/30/2018 09:50   Ct Chest Wo Contrast  Result Date: 11/17/2018 CLINICAL DATA:  58 year old female Caucasian female nonsmoker with history of bilateral lower extremity edema. Possible stage IV breast cancer. EXAM: CT CHEST WITHOUT CONTRAST TECHNIQUE: Multidetector CT imaging of the chest was performed following the standard protocol without IV contrast. COMPARISON:  Chest CT 10/26/2018. FINDINGS: Cardiovascular: Heart size is normal. There is no significant pericardial fluid, thickening or pericardial calcification. There is aortic atherosclerosis, as well as atherosclerosis of the great vessels of the mediastinum and the coronary arteries, including calcified atherosclerotic plaque in the left anterior descending and right coronary arteries. Mediastinum/Nodes: No pathologically enlarged mediastinal or hilar lymph nodes. Please note that accurate exclusion of hilar adenopathy is limited on noncontrast CT scans. Esophagus is unremarkable in appearance. Multiple borderline enlarged and mildly enlarged bilateral axillary and subpectoral lymph nodes (right greater than left) measuring up to 15 mm in short axis on the right (axial image 49 of series 2). Lungs/Pleura: Extensive septal thickening with band like opacities throughout both lungs, similar to the prior study. No discrete pulmonary nodules or masses are confidently identified. Moderate left and small right pleural effusions. The left pleural effusion is predominantly sub pulmonic in position. Upper Abdomen: Several peripherally calcified gallstones lying dependently in the gallbladder measuring up to 1.9 cm in diameter. Aortic atherosclerosis. Musculoskeletal: Right breast is incompletely imaged, but there appears to be irregular soft tissue infiltration throughout much of the right breast tissue, which could reflect tumoral in for tray shin and/or edema.  Diffuse lytic and sclerotic lesions throughout the visualized axial and appendicular skeleton, indicative of widespread metastatic disease to the bones. With appears very similar to the prior examination. There continue to be multiple nondisplaced to minimally displaced pathologic fractures throughout the ribs bilaterally. In addition, there is a new widely displaced fracture of the medial aspect of the right clavicle with approximately 1.4 cm of lateral and anterior displacement, as well as high attenuation thickening of the adjacent musculature in the upper right pectoralis major and lower right sternocleidomastoid muscles, most compatible with intramuscular hematoma and/or tumor. IMPRESSION: 1. Widespread metastatic disease to the bones redemonstrated, with multiple nondisplaced minimally displaced bilateral rib fractures, as well as a new widely displaced fracture of the medial aspect of the right clavicle with probable adjacent intramuscular hemorrhage in the lower right sternocleidomastoid muscle and upper right pectoralis major muscle. Probable tumoral infiltration throughout the right breast, as well as bilateral axillary and subpectoral lymphadenopathy (right greater than left). 2. Unusual appearance of the lungs, similar to the prior study, which could suggest chronic systemic disease such as sarcoidosis or interstitial lung disease. The possibility of lymphangitic spread of tumor in the lungs could be considered, but is not strongly favored at this time. 3. Moderate left (predominantly subpulmonic) pleural effusion and small right pleural effusion. 4. Cholelithiasis. 5. Aortic atherosclerosis, in addition to 2 vessel coronary artery disease. Aortic Atherosclerosis (ICD10-I70.0). Electronically Signed   By: Vinnie Langton M.D.   On: 11/17/2018 14:55   Nm Bone Scan Whole Body  Result Date: 10/27/2018 CLINICAL DATA:  Breast cancer staging. EXAM: NUCLEAR MEDICINE WHOLE BODY BONE SCAN TECHNIQUE: Whole  body anterior and posterior images were obtained approximately 3 hours after intravenous injection of radiopharmaceutical.  RADIOPHARMACEUTICALS:  20.6 mCi Technetium-23mMDP IV COMPARISON:  CT scan Oct 26, 2018 FINDINGS: Bony metastatic disease is seen in both of the femurs, numerous ribs, the manubrium, and right humerus. The widespread bony metastatic disease in the scapula, thoracic spine, sternum, and proximal humeri is better appreciated on the comparison CT scan. IMPRESSION: Widespread bony metastatic disease. The extent of metastatic disease is actually better appreciated on the recent CT imaging. This study is a near super scan consistent with widespread bony metastatic disease. Electronically Signed   By: DDorise BullionIII M.D   On: 10/27/2018 16:01   Dg Chest Port 1 View  Result Date: 11/20/2018 CLINICAL DATA:  Hypoxia, metastatic breast cancer EXAM: PORTABLE CHEST 1 VIEW COMPARISON:  Portable exam 1200 hours compared to 11/19/2018 FINDINGS: Very low lung volumes. Stable heart size mediastinal contours. BILATERAL pulmonary opacities again identified, unchanged. No new infiltrate, pleural effusion or pneumothorax. Scattered areas of osseous sclerosis consistent with osseous metastatic disease IMPRESSION: Widespread osseous metastatic disease. Persistent pulmonary opacities. Electronically Signed   By: MLavonia DanaM.D.   On: 11/20/2018 14:06   Dg Chest Port 1 View  Result Date: 11/19/2018 CLINICAL DATA:  Shortness of breath EXAM: PORTABLE CHEST 1 VIEW COMPARISON:  Yesterday FINDINGS: Very low volume chest with patchy bilateral infiltrate greater on the right. Some of these are streaky and suggestive of scarring or atelectasis. No visible effusion or pneumothorax. Largely obscured heart which is not enlarged by recent CT. Extensive osseous metastatic disease IMPRESSION: Stable low volume chest with atelectasis and interstitial opacity, reference chest CT from 2 days ago. Electronically Signed   By:  JMonte FantasiaM.D.   On: 11/19/2018 08:54   Dg Chest Port 1 View  Result Date: 11/18/2018 CLINICAL DATA:  Shortness of breath and increased weakness this morning; history metastatic breast cancer EXAM: PORTABLE CHEST 1 VIEW COMPARISON:  Portable exam 0823 hours compared to 11/17/2018 FINDINGS: Very low lung volumes. Normal heart size mediastinal contours. Patchy opacities in the lungs bilaterally favoring pneumonia over atelectasis. No pleural effusion or pneumothorax. Patchy osteosclerosis consistent with osseous metastatic disease. IMPRESSION: BILATERAL pulmonary opacities likely representing multifocal pneumonia. Electronically Signed   By: MLavonia DanaM.D.   On: 11/18/2018 09:30   Dg Chest Port 1 View  Result Date: 11/17/2018 CLINICAL DATA:  Shortness of breath.  Metastatic breast cancer. EXAM: PORTABLE CHEST 1 VIEW COMPARISON:  11/16/2018.  CT 10/26/2018. FINDINGS: Mediastinum stable. Heart size normal. Persistent left base atelectasis infiltrate. Right base atelectasis/infiltrate also noted on today's exam. Diffuse bilateral interstitial prominence again noted. Tiny left pleural effusion again noted. No pneumothorax. Multiple lytic lesions noted throughout the visualized bony structures. IMPRESSION: 1. Persistent left base atelectasis/infiltrate. Right base atelectasis/infiltrate also noted on today's exam. Small left pleural effusion again noted. 2. Diffuse bilateral interstitial prominence again noted consistent with interstitial tumor spread this patient with known metastatic disease. 3. Multiple lytic lesions are again noted throughout the visualized bony structures consistent with metastatic disease. Electronically Signed   By: TMarcello Moores Register   On: 11/17/2018 06:24   Dg Chest Port 1 View  Result Date: 11/16/2018 CLINICAL DATA:  Dyspnea, metastatic breast cancer EXAM: PORTABLE CHEST 1 VIEW COMPARISON:  Chest radiograph from one day prior. FINDINGS: Low lung volumes. Stable cardiomediastinal  silhouette with mild cardiomegaly. No pneumothorax. Stable small bilateral pleural effusions. Stable patchy left lung base consolidation. Stable thick irregular reticular opacities in the upper lungs. IMPRESSION: 1. Stable low lung volumes with small bilateral pleural effusions. 2. Stable  thick irregular reticular opacities in the upper lungs and patchy left lung base consolidation. Findings likely representing combination of lymphangitic tumor (as seen on recent chest CT) and atelectasis. A component of mild pulmonary edema cannot be excluded. Electronically Signed   By: Ilona Sorrel M.D.   On: 11/16/2018 07:13   Dg Chest Port 1 View  Result Date: 10/28/2018 CLINICAL DATA:  Bone injury EXAM: PORTABLE CHEST 1 VIEW COMPARISON:  10/26/2018 FINDINGS: Diffuse widespread metastatic disease is again noted involving the osseous structures. There is an acute appearing fracture involving the fourth rib posterolaterally on the left. There are likely additional left-sided rib fractures that are suboptimally evaluated on this exam. The lung volumes are low. The cardiac silhouette is enlarged. There are growing bilateral pleural effusions and prominent bilateral interstitial lung markings. Bibasilar airspace opacities are noted. IMPRESSION: 1. Acute left-sided rib fracture involving the fourth rib posterolaterally. No pneumothorax. There are likely additional adjacent fractures that are suboptimally evaluated on this exam. 2. Low lung volumes. 3. Cardiomegaly with growing bilateral pleural effusions and prominent interstitial lung markings suggestive of progressive pulmonary edema. Electronically Signed   By: Constance Holster M.D.   On: 10/28/2018 01:55   Dg Abd 2 Views  Result Date: 11/13/2018 CLINICAL DATA:  Pt c/o sob, weakness, cancer patient. EXAM: ABDOMEN - 2 VIEW COMPARISON:  None. FINDINGS: Overall visualized bowel gas pattern is nonobstructive. Moderate amount of stool and gas throughout the colon. No evidence  of free intraperitoneal air seen. Diffuse osseous metastases, as previously described. IMPRESSION: 1. Nonobstructive bowel gas pattern. Moderate amount of stool and gas throughout the colon. 2. Diffuse osseous metastases. Electronically Signed   By: Franki Cabot M.D.   On: 11/13/2018 12:33   Vas [place] Lower Extremity Venous (dvt)  Result Date: 11/18/2018  Lower Venous Study Indications: Swelling.  Risk Factors: Cancer. Limitations: Body habitus and poor ultrasound/tissue interface. Comparison Study: No prior studies. Performing Technologist: Oliver Hum RVT  Examination Guidelines: A complete evaluation includes B-mode imaging, spectral Doppler, color Doppler, and power Doppler as needed of all accessible portions of each vessel. Bilateral testing is considered an integral part of a complete examination. Limited examinations for reoccurring indications may be performed as noted.  +---------+---------------+---------+-----------+----------+-------+ RIGHT    CompressibilityPhasicitySpontaneityPropertiesSummary +---------+---------------+---------+-----------+----------+-------+ CFV      Full           Yes      Yes                          +---------+---------------+---------+-----------+----------+-------+ SFJ      Full                                                 +---------+---------------+---------+-----------+----------+-------+ FV Prox  Full                                                 +---------+---------------+---------+-----------+----------+-------+ FV Mid   Full                                                 +---------+---------------+---------+-----------+----------+-------+  FV DistalFull                                                 +---------+---------------+---------+-----------+----------+-------+ PFV      Full                                                 +---------+---------------+---------+-----------+----------+-------+ POP      Full            Yes      Yes                          +---------+---------------+---------+-----------+----------+-------+ PTV      Full                                                 +---------+---------------+---------+-----------+----------+-------+ PERO     Full                                                 +---------+---------------+---------+-----------+----------+-------+   +---------+---------------+---------+-----------+----------+-------+ LEFT     CompressibilityPhasicitySpontaneityPropertiesSummary +---------+---------------+---------+-----------+----------+-------+ CFV      Full           Yes      Yes                          +---------+---------------+---------+-----------+----------+-------+ SFJ      Full                                                 +---------+---------------+---------+-----------+----------+-------+ FV Prox  Full                                                 +---------+---------------+---------+-----------+----------+-------+ FV Mid   Full                                                 +---------+---------------+---------+-----------+----------+-------+ FV DistalFull                                                 +---------+---------------+---------+-----------+----------+-------+ PFV      Full                                                 +---------+---------------+---------+-----------+----------+-------+ POP  Full           Yes      Yes                          +---------+---------------+---------+-----------+----------+-------+ PTV      Full                                                 +---------+---------------+---------+-----------+----------+-------+ PERO     Full                                                 +---------+---------------+---------+-----------+----------+-------+     Summary: Right: There is no evidence of deep vein thrombosis in the lower extremity. No cystic  structure found in the popliteal fossa. Left: There is no evidence of deep vein thrombosis in the lower extremity. No cystic structure found in the popliteal fossa.  *See table(s) above for measurements and observations. Electronically signed by Deitra Mayo MD on 11/18/2018 at 11:42:50 AM.    Final    Ir Thoracentesis Asp Pleural Space W/img Guide  Result Date: 10/29/2018 INDICATION: Patient with history of metastatic disease on imaging, dyspnea, and bilateral pleural effusions. Request is made for therapeutic left thoracentesis. EXAM: ULTRASOUND GUIDED THERAPEUTIC LEFT THORACENTESIS MEDICATIONS: 10 mL 1% lidocaine COMPLICATIONS: None immediate. PROCEDURE: An ultrasound guided thoracentesis was thoroughly discussed with the patient and questions answered. The benefits, risks, alternatives and complications were also discussed. The patient understands and wishes to proceed with the procedure. Written consent was obtained. Ultrasound was performed to localize and mark an adequate pocket of fluid in the left chest. The area was then prepped and draped in the normal sterile fashion. 1% Lidocaine was used for local anesthesia. Under ultrasound guidance a 6 Fr Safe-T-Centesis catheter was introduced. Thoracentesis was performed. The catheter was removed and a dressing applied. FINDINGS: A total of approximately 350 mL of hazy gold fluid was removed. IMPRESSION: Successful ultrasound guided left thoracentesis yielding 350 mL of pleural fluid. Read by: Earley Abide, PA-C Electronically Signed   By: Jerilynn Mages.  Shick M.D.   On: 10/29/2018 15:58   Ir Thoracentesis Asp Pleural Space W/img Guide  Result Date: 10/28/2018 INDICATION: Patient with history of metastatic disease (including bone metastases) on imaging, dyspnea, and bilateral pleural effusions (R>L). Request is made for diagnostic and therapeutic right thoracentesis. EXAM: ULTRASOUND GUIDED DIAGNOSTIC AND THERAPEUTIC RIGHT THORACENTESIS MEDICATIONS: 10 mL 1%  lidocaine COMPLICATIONS: None immediate. PROCEDURE: An ultrasound guided thoracentesis was thoroughly discussed with the patient and questions answered. The benefits, risks, alternatives and complications were also discussed. The patient understands and wishes to proceed with the procedure. Written consent was obtained. Ultrasound was performed to localize and mark an adequate pocket of fluid in the right chest. The area was then prepped and draped in the normal sterile fashion. 1% Lidocaine was used for local anesthesia. Under ultrasound guidance a 6 Fr Safe-T-Centesis catheter was introduced. Thoracentesis was performed. The catheter was removed and a dressing applied. FINDINGS: A total of approximately 600 mL of clear gold fluid was removed. Samples were sent to the laboratory as requested by the clinical team. IMPRESSION: Successful ultrasound guided right thoracentesis yielding 600 mL of pleural fluid. Read  by: Earley Abide, PA-C Electronically Signed   By: Jerilynn Mages.  Shick M.D.   On: 10/28/2018 16:10   US Thoracentesis Asp Pleural Space W/img Guide  Result Date: 11/15/2018 INDICATION: Patient with history of metastatic breast cancer, dyspnea, bilateral pleural effusions L>R. Request is made for therapeutic left thoracentesis. EXAM: ULTRASOUND GUIDED THERAPEUTIC LEFT THORACENTESIS MEDICATIONS: 10 mL 1% lidocaine COMPLICATIONS: None immediate. PROCEDURE: An ultrasound guided thoracentesis was thoroughly discussed with the patient and questions answered. The benefits, risks, alternatives and complications were also discussed. The patient understands and wishes to proceed with the procedure. Written consent was obtained. Ultrasound was performed to localize and mark an adequate pocket of fluid in the left chest. The area was then prepped and draped in the normal sterile fashion. 1% Lidocaine was used for local anesthesia. Under ultrasound guidance a 6 Fr Safe-T-Centesis catheter was introduced. Thoracentesis was  performed. The catheter was removed and a dressing applied. FINDINGS: A total of approximately 400 mL of dark red fluid was removed. IMPRESSION: Successful ultrasound guided left thoracentesis yielding 400 mL of pleural fluid. Read by: Earley Abide, PA-C Electronically Signed   By: Markus Daft M.D.   On: 11/15/2018 14:32    ASSESSMENT: 58 y.o. [place], [place] woman presenting 10/26/2018 with right-sided inflammatory breast cancer, stage IV, involving lungs, lymph nodes and bones, as follows:             (a) chest CT scan 10/26/2018 shows bilateral pleural effusions, possible lymphangitic spread of tumor, diffuse bony metastatic disease, and significant axillary mediastinal and hilar adenopathy             (b) bone scan 10/27/2018 is a "near [place]" consistent with widespread bony metastatic disease             (c) head CT with and without contrast 10/30/2018 shows no intracranial metastatic disease, multiple calvarial lesions             (d) CA-27-29 on 10/27/2018 was 1810.4  (1) pleural fluid from right thoracentesis 10/28/2018 confirms malignant cells consistent with a breast primary, strongly estrogen and progesterone receptor positive, HER-2 not amplified, with an MIB-1 of 2%  (2) anastrozole started 10/29/2018             (a) palbociclib to start as soon as available             (b) denosumab/xgeva started 11/13/2018  (3) associated problems:             (a) hypoxia secondary to effusions             (b) pain from bone lesions             (c) right upper extremity lymphedema             (d) anemia, requiring transfusion             (e) poor venous access  (4) genetics testing pending  (5) hypocalcemia  (6) hypokalemia  (7) Anemia  (8) thrombocytopenia  (9) goals of care  PLAN:  Jemeka remained stable overall.  Her breathing has improved.  She continues on anastrozole and started her Ibrance earlier this week.  So far, she is tolerating these well.   Anticipate a slow continued improvement in her pulmonary symptoms over the next 2 months.  Her platelet count continues to improve and is up to 129,000.  Anemia slightly worse today with a hemoglobin of 7.8.  She is not actively bleeding.  Recommend packed red blood  cell transfusion if her hemoglobin less than 7.0 or active bleeding.  PT/OT are following the patient.  She is slowly progressing.  Progress is limited due to rapid fatigue and dyspnea on exertion.  They have recommended skilled nursing facility placement.  Social work and Case Management are following and I note that an [facility] is on the chart.  I have strongly recommended to the patient that she attempt to walk in the hallway with nursing staff or therapy.  She states that she will do this.  Will follow with you   LOS: 12 days   Mikey Bussing, DNP, AGPCNP-BC, AOCNP 11/26/18

## 2018-11-26 NOTE — Progress Notes (Signed)
[facility]                                                                              Patient Demographics  Suzanne [name], is a 58 y.o. 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  Patient admitted underwent thoracentesis of the right and left lungs on 6/2 and 6/3 and scheduled for repeat 3 days later. Patient was admitted respiratory status worsened and patient placed on the BiPAP and transferred to the stepdown unit.  Patient also placed on IV diuretics.  Patient noted to spike a fever and as such also placed on IV antibiotics for presumed aspiration pneumonia.  Due to tenuous respiratory status critical care consulted. Patient underwent left-sided thoracentesis on 11/15/2018 with 400 cc of back red pleural fluid removed.  Cytology from thoracentesis of 10/28/2018+ for malignant cells consistent with breast cancer.  COVID-19 negative.  IV antibiotics narrowed down to oral Augmentin starting 11/19/2018.  Lower extremity Dopplers which were done were negative.   Palliative care consulted for goals of care.  Oncology following as well.   Assessment & Plan    Principal Problem:   Acute on chronic respiratory failure with hypoxia (HCC) likely multifactorial secondary to malignant pleural  effusions, metastatic stage IV breast CA, possible aspiration pneumonia, volume overload -Status post thoracentesis on 6/28 on the left with 400 cc of dark red pleural fluid removed -CT chest on 6/22 had shown widespread metastatic disease to the bones, multiple bilateral rib fractures, medial aspect of right clavicle fracture, probable adjacent intramuscular hemorrhage on the right lower sternocleidomastoid muscle and upper right pectoralis major muscle, probable tumor infiltration throughout the right breast as well as bilateral axillary and subpectoral lymphadenopathy.  Possibility of lymphangitic spread of the tumor in the lungs. -COVID-19 test negative -Post thoracentesis, patient had increased O2 requirements, and needed BiPAP and diuresis.  -Currently improving, O2 sats in 90s on 2 L O2 via nasal cannula -Patient was placed on IV Lasix with good diuresis, negative balance of 13.4 L.  Transition to oral Lasix -Patient was placed on IV antibiotics empirically, has been transitioned to Augmentin per CCM -Continue pulmonary toileting.  Active Problems: Hypocalcemia -Likely secondary to recent Xgeva ejection on 6/21. -Corrected calcium 7.8 with albumin, will replace IV.  Magnesium 2.1.  Malignant pleural effusion/loculated pleural effusion in the setting of right-sided inflammatory stage IV breast CA, mets involving lungs, lymph nodes, bones -Status post thoracentesis, cytology  positive for malignant cells consistent with breast cancer. -Patient may require Pleurx catheter if recurrence of effusion however not at this time per CCM. -Oncology, palliative care following, continue Demadex -Oncology started patient on palbociclib on 6/30.  Probable aspiration pneumonia -Patient was initially placed on IV antibiotics empirically, transition to oral Augmentin, patient has completed course of antibiotics  Microcytic anemia -Hemoglobin 7.8, follow closely.  Transfuse for hemoglobin less than 7,  oncology following  Volume overload, suspected acute diastolic CHF in the setting of blood transfusion -Shortness of breath improved after IV Lasix, 2D echo 6/1 had shown EF of 60 to 15% with diastolic parameters indeterminate. -Now transitioned to oral Lasix, negative balance of 13.4 L. -Continue strict I's and O's and daily weights, wean O2 as tolerated  Sinus tachycardia Resolved  Diarrhea Resolved   Code Status: Full code DVT Prophylaxis:  Lovenox  Family Communication: Discussed in detail with the patient, all imaging results, lab results explained to the patient    Disposition Plan: Awaiting skilled nursing facility,  COVID-19 test ordered  Time Spent in minutes  77mins   Procedures:   6/02 Right Pleural Fluid Cytology >>positive for malignant cells c/w breast cancer  6/20 Left Thoracentesis >>400 ml dark red pleural fluid removed  11/17/2018 lower extremity Dopplers CT chest 11/17/2018   Consultants:     PCCM: Dr. Carson Myrtle   Palliative care: Dr. Rowe Pavy  Allergy: Dr. Jana Hakim   Antimicrobials:   Anti-infectives (From admission, onward)   Start     Dose/Rate Route Frequency Ordered Stop   11/19/18 2200  amoxicillin-clavulanate (AUGMENTIN) 875-125 MG per tablet 1 tablet     1 tablet Oral Every 12 hours 11/19/18 0833 11/22/18 1008   11/17/18 0600  piperacillin-tazobactam (ZOSYN) IVPB 3.375 g     3.375 g 12.5 mL/hr over 240 Minutes Intravenous Every 8 hours 11/16/18 2235 11/19/18 1833   11/17/18 0000  piperacillin-tazobactam (ZOSYN) IVPB 3.375 g  Status:  Discontinued     3.375 g 100 mL/hr over 30 Minutes Intravenous Every 6 hours 11/16/18 2231 11/16/18 2235   11/16/18 0900  ceFEPIme (MAXIPIME) 1 g in sodium chloride 0.9 % 100 mL IVPB  Status:  Discontinued     1 g 200 mL/hr over 30 Minutes Intravenous Every 8 hours 11/16/18 0815 11/16/18 2231          Medications  Scheduled Meds:  anastrozole  1 mg Oral Daily   benzonatate  200 mg Oral TID    calcium-vitamin D  1 tablet Oral TID   carvedilol  6.25 mg Oral BID WC   enoxaparin (LOVENOX) injection  40 mg Subcutaneous Daily   feeding supplement (ENSURE ENLIVE)  237 mL Oral TID BM   furosemide  40 mg Oral BID   mouth rinse  15 mL Mouth Rinse q12n4p   palbociclib  125 mg Oral Q breakfast   potassium chloride  40 mEq Oral Daily   senna-docusate  1 tablet Oral QHS   Continuous Infusions:  sodium chloride Stopped (11/16/18 1407)   PRN Meds:.sodium chloride, acetaminophen **OR** acetaminophen, bisacodyl, clotrimazole, HYDROcodone-homatropine, levalbuterol, loperamide, LORazepam, morphine injection, ondansetron **OR** ondansetron (ZOFRAN) IV, oxyCODONE, polyethylene glycol      Subjective:   Trysta Showman was seen and examined today.  No significant complaints, currently on 2 L O2 via nasal cannula, denies any chest pain.  Shortness of breath improving.  No diarrhea.  Patient denies dizziness, abdominal pain, N/V/D/C, new weakness, numbess, tingling. No acute events overnight.    Objective:  Vitals:   11/25/18 0800 11/25/18 1415 11/25/18 2050 11/26/18 0531  BP:  127/74 124/71 126/78  Pulse:  95 90 95  Resp:  19 16 20   Temp:  98.6 F (37 C) 98.1 F (36.7 C) 98.3 F (36.8 C)  TempSrc:   Oral Oral  SpO2:  97% 96% 95%  Weight: 87 kg   86.2 kg  Height:        Intake/Output Summary (Last 24 hours) at 11/26/2018 1327 Last data filed at 11/26/2018 0600 Gross per 24 hour  Intake 700 ml  Output 1050 ml  Net -350 ml     Wt Readings from Last 3 Encounters:  11/26/18 86.2 kg  10/30/18 106.2 kg  02/21/11 106.6 kg     Exam  General: Alert and oriented x 2, NAD  Eyes:   HEENT:  Atraumatic, normocephalic, normal oropharynx  Cardiovascular: S1 S2 auscultated. Regular rate and rhythm.  Respiratory: No wheezing, diminished breath sounds at bases  Gastrointestinal: Soft, nontender, nondistended, + bowel sounds  Ext: trace pedal edema bilaterally  Neuro: No  new deficits  Musculoskeletal: No digital cyanosis, clubbing  Skin: Slowly improving petechial rash in the bilateral feet and lower extremities  Psych: Normal affect and demeanor, alert and oriented x2    Data Reviewed:  I have personally reviewed following labs and imaging studies  Micro Results No results found for this or any previous visit (from the past 240 hour(s)).  Radiology Reports Dg Chest 1 View  Result Date: 11/15/2018 CLINICAL DATA:  Post left-sided thoracentesis. EXAM: CHEST  1 VIEW COMPARISON:  11/14/2018 FINDINGS: Lungs are hypoinflated demonstrate interval improvement with moderate residual left-sided pleural effusion likely with associated basilar atelectasis. No pneumothorax. Mild prominence of the perihilar markings without significant change. Cardiomediastinal silhouette and remainder of the exam is unchanged. IMPRESSION: Hypoinflation with interval improvement in left pleural effusion post thoracentesis. No pneumothorax. Moderate residual left effusion likely with associated basilar atelectasis. Prominence of the perihilar markings suggesting vascular congestion. Electronically Signed   By: Marin Olp M.D.   On: 11/15/2018 13:58   Dg Chest 1 View  Result Date: 10/29/2018 CLINICAL DATA:  Post LEFT thoracentesis EXAM: CHEST  1 VIEW COMPARISON:  622 in 20 FINDINGS: Upper normal heart size. Stable mediastinal contours. BILATERAL upper lobe scarring. Improved aeration at bases since prior study. Minimal residual LEFT pleural effusion without pneumothorax. Bones demineralized. IMPRESSION: Extensive chronic lung changes. No pneumothorax following LEFT thoracentesis. Electronically Signed   By: Lavonia Dana M.D.   On: 10/29/2018 15:50   Dg Chest 1 View  Result Date: 10/28/2018 CLINICAL DATA:  Post right thoracentesis EXAM: CHEST  1 VIEW COMPARISON:  10/28/2018 FINDINGS: Very low lung volumes again noted. Interstitial prominence within the lungs. Heart is borderline in size with  vascular congestion. No pneumothorax following right thoracentesis. Diffuse skeletal metastases again noted. IMPRESSION: Very low lung volumes with cardiomegaly, vascular congestion and interstitial prominence. No pneumothorax following thoracentesis. Electronically Signed   By: Rolm Baptise M.D.   On: 10/28/2018 16:00   Dg Chest 2 View  Result Date: 11/14/2018 CLINICAL DATA:  Stage IV breast cancer.  Shortness of breath. EXAM: CHEST - 2 VIEW COMPARISON:  Radiographs 11/13/2018 and 10/29/2018.  CT 10/26/2018. FINDINGS: The heart size and mediastinal contours are stable. Left pleural effusion and left basilar opacity are unchanged from the recent study. Diffusely increased interstitial markings are again noted throughout both lungs, remaining suspicious for lymphangitic spread of tumor based on previous CT. No progressive airspace opacities identified.  Diffuse osseous metastatic disease noted without evidence of acute fracture. IMPRESSION: Stable radiographic appearance of the chest since yesterday. A left pleural effusion and left basilar pulmonary opacity have increased from older prior studies. Evidence of extensive metastatic disease again noted. Electronically Signed   By: Richardean Sale M.D.   On: 11/14/2018 17:10   Dg Chest 2 View  Result Date: 11/13/2018 CLINICAL DATA:  Shortness of breath, weakness. Cancer patient. EXAM: CHEST - 2 VIEW COMPARISON:  Chest x-ray dated 10/29/2018. Chest CT dated 10/26/2018. FINDINGS: Heart size and mediastinal contours appear stable. Increased opacity at the LEFT lung base, likely increased pleural effusion. There is also central pulmonary vascular congestion and bilateral perihilar edema. No pneumothorax seen. Diffuse osseous metastases, as described on previous CT report. IMPRESSION: 1. Increased opacity at the LEFT lung base, likely increased pleural effusion. Superimposed pneumonia or airspace collapse not excluded. 2. Central pulmonary vascular congestion and  bilateral perihilar edema indicating CHF/volume overload. 3. Diffuse osseous metastases. Electronically Signed   By: Franki Cabot M.D.   On: 11/13/2018 12:32   Ct Head W & Wo Contrast  Result Date: 10/30/2018 CLINICAL DATA:  Metastatic breast cancer. No neurologic symptoms are reported. EXAM: CT HEAD WITHOUT AND WITH CONTRAST TECHNIQUE: Contiguous axial images were obtained from the base of the skull through the vertex without and with intravenous contrast CONTRAST:  91mL ISOVUE-300 IOPAMIDOL (ISOVUE-300) INJECTION 61% COMPARISON:  Nuclear medicine whole-body bone scan, 10/27/2018 FINDINGS: Brain: No acute stroke, hemorrhage, mass lesion or hydrocephalus. Mild atrophy. No extra-axial fluid. Post infusion, no abnormal enhancement of the brain or visible meninges. Vascular: No hyperdense vessel or unexpected calcification. Visible vessels are patent. Skull: Widespread metastatic disease to the calvarium, skull base, and visible cervical spine. Metastatic disease to the mandible is also present. There is destruction of the inner and outer table of the skull in multiple locations. Epidural tumor is not established. Sinuses/Orbits: No acute finding. Other: None. IMPRESSION: 1. Widespread osseous metastatic disease to the calvarium, skull base, and visible cervical spine. Epidural tumor not established. 2. No intracranial metastatic disease is evident. Electronically Signed   By: Staci Righter M.D.   On: 10/30/2018 09:50   Ct Chest Wo Contrast  Result Date: 11/17/2018 CLINICAL DATA:  58 year old female Caucasian female nonsmoker with history of bilateral lower extremity edema. Possible stage IV breast cancer. EXAM: CT CHEST WITHOUT CONTRAST TECHNIQUE: Multidetector CT imaging of the chest was performed following the standard protocol without IV contrast. COMPARISON:  Chest CT 10/26/2018. FINDINGS: Cardiovascular: Heart size is normal. There is no significant pericardial fluid, thickening or pericardial  calcification. There is aortic atherosclerosis, as well as atherosclerosis of the great vessels of the mediastinum and the coronary arteries, including calcified atherosclerotic plaque in the left anterior descending and right coronary arteries. Mediastinum/Nodes: No pathologically enlarged mediastinal or hilar lymph nodes. Please note that accurate exclusion of hilar adenopathy is limited on noncontrast CT scans. Esophagus is unremarkable in appearance. Multiple borderline enlarged and mildly enlarged bilateral axillary and subpectoral lymph nodes (right greater than left) measuring up to 15 mm in short axis on the right (axial image 49 of series 2). Lungs/Pleura: Extensive septal thickening with band like opacities throughout both lungs, similar to the prior study. No discrete pulmonary nodules or masses are confidently identified. Moderate left and small right pleural effusions. The left pleural effusion is predominantly sub pulmonic in position. Upper Abdomen: Several peripherally calcified gallstones lying dependently in the gallbladder measuring up to 1.9 cm in diameter. Aortic atherosclerosis.  Musculoskeletal: Right breast is incompletely imaged, but there appears to be irregular soft tissue infiltration throughout much of the right breast tissue, which could reflect tumoral in for tray shin and/or edema. Diffuse lytic and sclerotic lesions throughout the visualized axial and appendicular skeleton, indicative of widespread metastatic disease to the bones. With appears very similar to the prior examination. There continue to be multiple nondisplaced to minimally displaced pathologic fractures throughout the ribs bilaterally. In addition, there is a new widely displaced fracture of the medial aspect of the right clavicle with approximately 1.4 cm of lateral and anterior displacement, as well as high attenuation thickening of the adjacent musculature in the upper right pectoralis major and lower right  sternocleidomastoid muscles, most compatible with intramuscular hematoma and/or tumor. IMPRESSION: 1. Widespread metastatic disease to the bones redemonstrated, with multiple nondisplaced minimally displaced bilateral rib fractures, as well as a new widely displaced fracture of the medial aspect of the right clavicle with probable adjacent intramuscular hemorrhage in the lower right sternocleidomastoid muscle and upper right pectoralis major muscle. Probable tumoral infiltration throughout the right breast, as well as bilateral axillary and subpectoral lymphadenopathy (right greater than left). 2. Unusual appearance of the lungs, similar to the prior study, which could suggest chronic systemic disease such as sarcoidosis or interstitial lung disease. The possibility of lymphangitic spread of tumor in the lungs could be considered, but is not strongly favored at this time. 3. Moderate left (predominantly subpulmonic) pleural effusion and small right pleural effusion. 4. Cholelithiasis. 5. Aortic atherosclerosis, in addition to 2 vessel coronary artery disease. Aortic Atherosclerosis (ICD10-I70.0). Electronically Signed   By: Vinnie Langton M.D.   On: 11/17/2018 14:55   Nm Bone Scan Whole Body  Result Date: 10/27/2018 CLINICAL DATA:  Breast cancer staging. EXAM: NUCLEAR MEDICINE WHOLE BODY BONE SCAN TECHNIQUE: Whole body anterior and posterior images were obtained approximately 3 hours after intravenous injection of radiopharmaceutical. RADIOPHARMACEUTICALS:  20.6 mCi Technetium-40m MDP IV COMPARISON:  CT scan Oct 26, 2018 FINDINGS: Bony metastatic disease is seen in both of the femurs, numerous ribs, the manubrium, and right humerus. The widespread bony metastatic disease in the scapula, thoracic spine, sternum, and proximal humeri is better appreciated on the comparison CT scan. IMPRESSION: Widespread bony metastatic disease. The extent of metastatic disease is actually better appreciated on the recent CT  imaging. This study is a near super scan consistent with widespread bony metastatic disease. Electronically Signed   By: Dorise Bullion III M.D   On: 10/27/2018 16:01   Dg Chest Port 1 View  Result Date: 11/20/2018 CLINICAL DATA:  Hypoxia, metastatic breast cancer EXAM: PORTABLE CHEST 1 VIEW COMPARISON:  Portable exam 1200 hours compared to 11/19/2018 FINDINGS: Very low lung volumes. Stable heart size mediastinal contours. BILATERAL pulmonary opacities again identified, unchanged. No new infiltrate, pleural effusion or pneumothorax. Scattered areas of osseous sclerosis consistent with osseous metastatic disease IMPRESSION: Widespread osseous metastatic disease. Persistent pulmonary opacities. Electronically Signed   By: Lavonia Dana M.D.   On: 11/20/2018 14:06   Dg Chest Port 1 View  Result Date: 11/19/2018 CLINICAL DATA:  Shortness of breath EXAM: PORTABLE CHEST 1 VIEW COMPARISON:  Yesterday FINDINGS: Very low volume chest with patchy bilateral infiltrate greater on the right. Some of these are streaky and suggestive of scarring or atelectasis. No visible effusion or pneumothorax. Largely obscured heart which is not enlarged by recent CT. Extensive osseous metastatic disease IMPRESSION: Stable low volume chest with atelectasis and interstitial opacity, reference chest CT from 2 days  ago. Electronically Signed   By: Monte Fantasia M.D.   On: 11/19/2018 08:54   Dg Chest Port 1 View  Result Date: 11/18/2018 CLINICAL DATA:  Shortness of breath and increased weakness this morning; history metastatic breast cancer EXAM: PORTABLE CHEST 1 VIEW COMPARISON:  Portable exam 0823 hours compared to 11/17/2018 FINDINGS: Very low lung volumes. Normal heart size mediastinal contours. Patchy opacities in the lungs bilaterally favoring pneumonia over atelectasis. No pleural effusion or pneumothorax. Patchy osteosclerosis consistent with osseous metastatic disease. IMPRESSION: BILATERAL pulmonary opacities likely  representing multifocal pneumonia. Electronically Signed   By: Lavonia Dana M.D.   On: 11/18/2018 09:30   Dg Chest Port 1 View  Result Date: 11/17/2018 CLINICAL DATA:  Shortness of breath.  Metastatic breast cancer. EXAM: PORTABLE CHEST 1 VIEW COMPARISON:  11/16/2018.  CT 10/26/2018. FINDINGS: Mediastinum stable. Heart size normal. Persistent left base atelectasis infiltrate. Right base atelectasis/infiltrate also noted on today's exam. Diffuse bilateral interstitial prominence again noted. Tiny left pleural effusion again noted. No pneumothorax. Multiple lytic lesions noted throughout the visualized bony structures. IMPRESSION: 1. Persistent left base atelectasis/infiltrate. Right base atelectasis/infiltrate also noted on today's exam. Small left pleural effusion again noted. 2. Diffuse bilateral interstitial prominence again noted consistent with interstitial tumor spread this patient with known metastatic disease. 3. Multiple lytic lesions are again noted throughout the visualized bony structures consistent with metastatic disease. Electronically Signed   By: Marcello Moores  Register   On: 11/17/2018 06:24   Dg Chest Port 1 View  Result Date: 11/16/2018 CLINICAL DATA:  Dyspnea, metastatic breast cancer EXAM: PORTABLE CHEST 1 VIEW COMPARISON:  Chest radiograph from one day prior. FINDINGS: Low lung volumes. Stable cardiomediastinal silhouette with mild cardiomegaly. No pneumothorax. Stable small bilateral pleural effusions. Stable patchy left lung base consolidation. Stable thick irregular reticular opacities in the upper lungs. IMPRESSION: 1. Stable low lung volumes with small bilateral pleural effusions. 2. Stable thick irregular reticular opacities in the upper lungs and patchy left lung base consolidation. Findings likely representing combination of lymphangitic tumor (as seen on recent chest CT) and atelectasis. A component of mild pulmonary edema cannot be excluded. Electronically Signed   By: Ilona Sorrel  M.D.   On: 11/16/2018 07:13   Dg Chest Port 1 View  Result Date: 10/28/2018 CLINICAL DATA:  Bone injury EXAM: PORTABLE CHEST 1 VIEW COMPARISON:  10/26/2018 FINDINGS: Diffuse widespread metastatic disease is again noted involving the osseous structures. There is an acute appearing fracture involving the fourth rib posterolaterally on the left. There are likely additional left-sided rib fractures that are suboptimally evaluated on this exam. The lung volumes are low. The cardiac silhouette is enlarged. There are growing bilateral pleural effusions and prominent bilateral interstitial lung markings. Bibasilar airspace opacities are noted. IMPRESSION: 1. Acute left-sided rib fracture involving the fourth rib posterolaterally. No pneumothorax. There are likely additional adjacent fractures that are suboptimally evaluated on this exam. 2. Low lung volumes. 3. Cardiomegaly with growing bilateral pleural effusions and prominent interstitial lung markings suggestive of progressive pulmonary edema. Electronically Signed   By: Constance Holster M.D.   On: 10/28/2018 01:55   Dg Abd 2 Views  Result Date: 11/13/2018 CLINICAL DATA:  Pt c/o sob, weakness, cancer patient. EXAM: ABDOMEN - 2 VIEW COMPARISON:  None. FINDINGS: Overall visualized bowel gas pattern is nonobstructive. Moderate amount of stool and gas throughout the colon. No evidence of free intraperitoneal air seen. Diffuse osseous metastases, as previously described. IMPRESSION: 1. Nonobstructive bowel gas pattern. Moderate amount of stool and  gas throughout the colon. 2. Diffuse osseous metastases. Electronically Signed   By: Franki Cabot M.D.   On: 11/13/2018 12:33   Vas [place] Lower Extremity Venous (dvt)  Result Date: 11/18/2018  Lower Venous Study Indications: Swelling.  Risk Factors: Cancer. Limitations: Body habitus and poor ultrasound/tissue interface. Comparison Study: No prior studies. Performing Technologist: Oliver Hum RVT  Examination  Guidelines: A complete evaluation includes B-mode imaging, spectral Doppler, color Doppler, and power Doppler as needed of all accessible portions of each vessel. Bilateral testing is considered an integral part of a complete examination. Limited examinations for reoccurring indications may be performed as noted.  +---------+---------------+---------+-----------+----------+-------+  RIGHT     Compressibility Phasicity Spontaneity Properties Summary  +---------+---------------+---------+-----------+----------+-------+  CFV       Full            Yes       Yes                             +---------+---------------+---------+-----------+----------+-------+  SFJ       Full                                                      +---------+---------------+---------+-----------+----------+-------+  FV Prox   Full                                                      +---------+---------------+---------+-----------+----------+-------+  FV Mid    Full                                                      +---------+---------------+---------+-----------+----------+-------+  FV Distal Full                                                      +---------+---------------+---------+-----------+----------+-------+  PFV       Full                                                      +---------+---------------+---------+-----------+----------+-------+  POP       Full            Yes       Yes                             +---------+---------------+---------+-----------+----------+-------+  PTV       Full                                                      +---------+---------------+---------+-----------+----------+-------+  PERO      Full                                                      +---------+---------------+---------+-----------+----------+-------+   +---------+---------------+---------+-----------+----------+-------+  LEFT      Compressibility Phasicity Spontaneity Properties Summary   +---------+---------------+---------+-----------+----------+-------+  CFV       Full            Yes       Yes                             +---------+---------------+---------+-----------+----------+-------+  SFJ       Full                                                      +---------+---------------+---------+-----------+----------+-------+  FV Prox   Full                                                      +---------+---------------+---------+-----------+----------+-------+  FV Mid    Full                                                      +---------+---------------+---------+-----------+----------+-------+  FV Distal Full                                                      +---------+---------------+---------+-----------+----------+-------+  PFV       Full                                                      +---------+---------------+---------+-----------+----------+-------+  POP       Full            Yes       Yes                             +---------+---------------+---------+-----------+----------+-------+  PTV       Full                                                      +---------+---------------+---------+-----------+----------+-------+  PERO      Full                                                      +---------+---------------+---------+-----------+----------+-------+  Summary: Right: There is no evidence of deep vein thrombosis in the lower extremity. No cystic structure found in the popliteal fossa. Left: There is no evidence of deep vein thrombosis in the lower extremity. No cystic structure found in the popliteal fossa.  *See table(s) above for measurements and observations. Electronically signed by Deitra Mayo MD on 11/18/2018 at 11:42:50 AM.    Final    Ir Thoracentesis Asp Pleural Space W/img Guide  Result Date: 10/29/2018 INDICATION: Patient with history of metastatic disease on imaging, dyspnea, and bilateral pleural effusions. Request is made for therapeutic left  thoracentesis. EXAM: ULTRASOUND GUIDED THERAPEUTIC LEFT THORACENTESIS MEDICATIONS: 10 mL 1% lidocaine COMPLICATIONS: None immediate. PROCEDURE: An ultrasound guided thoracentesis was thoroughly discussed with the patient and questions answered. The benefits, risks, alternatives and complications were also discussed. The patient understands and wishes to proceed with the procedure. Written consent was obtained. Ultrasound was performed to localize and mark an adequate pocket of fluid in the left chest. The area was then prepped and draped in the normal sterile fashion. 1% Lidocaine was used for local anesthesia. Under ultrasound guidance a 6 Fr Safe-T-Centesis catheter was introduced. Thoracentesis was performed. The catheter was removed and a dressing applied. FINDINGS: A total of approximately 350 mL of hazy gold fluid was removed. IMPRESSION: Successful ultrasound guided left thoracentesis yielding 350 mL of pleural fluid. Read by: Earley Abide, PA-C Electronically Signed   By: Jerilynn Mages.  Shick M.D.   On: 10/29/2018 15:58   Ir Thoracentesis Asp Pleural Space W/img Guide  Result Date: 10/28/2018 INDICATION: Patient with history of metastatic disease (including bone metastases) on imaging, dyspnea, and bilateral pleural effusions (R>L). Request is made for diagnostic and therapeutic right thoracentesis. EXAM: ULTRASOUND GUIDED DIAGNOSTIC AND THERAPEUTIC RIGHT THORACENTESIS MEDICATIONS: 10 mL 1% lidocaine COMPLICATIONS: None immediate. PROCEDURE: An ultrasound guided thoracentesis was thoroughly discussed with the patient and questions answered. The benefits, risks, alternatives and complications were also discussed. The patient understands and wishes to proceed with the procedure. Written consent was obtained. Ultrasound was performed to localize and mark an adequate pocket of fluid in the right chest. The area was then prepped and draped in the normal sterile fashion. 1% Lidocaine was used for local anesthesia. Under  ultrasound guidance a 6 Fr Safe-T-Centesis catheter was introduced. Thoracentesis was performed. The catheter was removed and a dressing applied. FINDINGS: A total of approximately 600 mL of clear gold fluid was removed. Samples were sent to the laboratory as requested by the clinical team. IMPRESSION: Successful ultrasound guided right thoracentesis yielding 600 mL of pleural fluid. Read by: Earley Abide, PA-C Electronically Signed   By: Jerilynn Mages.  Shick M.D.   On: 10/28/2018 16:10   US Thoracentesis Asp Pleural Space W/img Guide  Result Date: 11/15/2018 INDICATION: Patient with history of metastatic breast cancer, dyspnea, bilateral pleural effusions L>R. Request is made for therapeutic left thoracentesis. EXAM: ULTRASOUND GUIDED THERAPEUTIC LEFT THORACENTESIS MEDICATIONS: 10 mL 1% lidocaine COMPLICATIONS: None immediate. PROCEDURE: An ultrasound guided thoracentesis was thoroughly discussed with the patient and questions answered. The benefits, risks, alternatives and complications were also discussed. The patient understands and wishes to proceed with the procedure. Written consent was obtained. Ultrasound was performed to localize and mark an adequate pocket of fluid in the left chest. The area was then prepped and draped in the normal sterile fashion. 1% Lidocaine was used for local anesthesia. Under ultrasound guidance a 6 Fr Safe-T-Centesis catheter was introduced. Thoracentesis was performed. The catheter was removed and a dressing applied. FINDINGS:  A total of approximately 400 mL of dark red fluid was removed. IMPRESSION: Successful ultrasound guided left thoracentesis yielding 400 mL of pleural fluid. Read by: Earley Abide, PA-C Electronically Signed   By: Markus Daft M.D.   On: 11/15/2018 14:32    Lab Data:  CBC: Recent Labs  Lab 11/20/18 0223  11/22/18 0224 11/23/18 0258 11/24/18 0247 11/25/18 0517 11/26/18 0514  WBC 9.1   < > 9.6 10.5 10.4 12.1* 10.3  NEUTROABS 5.5  --  5.1 5.7 5.9  --   6.0  HGB 8.0*   < > 8.5* 8.6* 8.2* 8.2* 7.8*  HCT 26.7*   < > 28.3* 29.2* 27.4* 26.4* 25.7*  MCV 104.3*   < > 104.4* 105.8* 104.6* 102.3* 103.6*  PLT 97*   < > 103* 112* 110* 120* 129*   < > = values in this interval not displayed.   Basic Metabolic Panel: Recent Labs  Lab 11/20/18 0223 11/21/18 0222 11/22/18 0224 11/23/18 0258 11/24/18 0247 11/25/18 0517 11/26/18 0514  NA 139 141 138 136 134* 134* 133*  K 3.1* 3.4* 3.9 3.8 3.9 3.8 4.2  CL 93* 94* 94* 92* 92* 92* 92*  CO2 33* 35* 32 32 29 29 30   GLUCOSE 156* 131* 131* 138* 133* 139* 132*  BUN 12 13 16 17  21* 22* 23*  CREATININE 0.58 0.60 0.66 0.72 0.75 0.80 0.90  CALCIUM 5.1* 5.8* 6.3* 6.8* 6.7* 6.9* 6.6*  MG 2.0 1.9 2.0  --   --  2.1 2.1  PHOS 2.5  --   --   --   --   --   --    GFR: Estimated Creatinine Clearance: 68.5 mL/min (by C-G formula based on SCr of 0.9 mg/dL). Liver Function Tests: Recent Labs  Lab 11/25/18 0517  AST 37  ALT 31  ALKPHOS 190*  BILITOT 1.0  PROT 7.1  ALBUMIN 2.5*   No results for input(s): LIPASE, AMYLASE in the last 168 hours. No results for input(s): AMMONIA in the last 168 hours. Coagulation Profile: No results for input(s): INR, PROTIME in the last 168 hours. Cardiac Enzymes: No results for input(s): CKTOTAL, CKMB, CKMBINDEX, TROPONINI in the last 168 hours. BNP (last 3 results) No results for input(s): PROBNP in the last 8760 hours. HbA1C: No results for input(s): HGBA1C in the last 72 hours. CBG: Recent Labs  Lab 11/20/18 2121 11/22/18 1639  GLUCAP 140* 161*   Lipid Profile: No results for input(s): CHOL, HDL, LDLCALC, TRIG, CHOLHDL, LDLDIRECT in the last 72 hours. Thyroid Function Tests: No results for input(s): TSH, T4TOTAL, FREET4, T3FREE, THYROIDAB in the last 72 hours. Anemia Panel: No results for input(s): VITAMINB12, FOLATE, FERRITIN, TIBC, IRON, RETICCTPCT in the last 72 hours. Urine analysis:    Component Value Date/Time   COLORURINE YELLOW 10/27/2018 2346    APPEARANCEUR HAZY (A) 10/27/2018 2346   LABSPEC 1.038 (H) 10/27/2018 2346   PHURINE 5.0 10/27/2018 2346   GLUCOSEU NEGATIVE 10/27/2018 2346   HGBUR NEGATIVE 10/27/2018 2346   BILIRUBINUR NEGATIVE 10/27/2018 2346   KETONESUR 5 (A) 10/27/2018 2346   PROTEINUR NEGATIVE 10/27/2018 2346   NITRITE NEGATIVE 10/27/2018 2346   LEUKOCYTESUR NEGATIVE 10/27/2018 2346     Sheli Dorin M.D. [facility] 11/26/2018, 1:27 PM  Pager: [phone] Between 7am to 7pm - call Pager - [phone]  After 7pm go to [email or web address] - password [email or web address]  Call night coverage person covering after 7pm

## 2018-11-27 ENCOUNTER — Encounter: Payer: Self-pay | Admitting: General Practice

## 2018-11-27 ENCOUNTER — Other Ambulatory Visit: Payer: Self-pay | Admitting: Oncology

## 2018-11-27 LAB — COMPREHENSIVE METABOLIC PANEL
ALT: 30 U/L (ref 0–44)
AST: 30 U/L (ref 15–41)
Albumin: 2.5 g/dL — ABNORMAL LOW (ref 3.5–5.0)
Alkaline Phosphatase: 201 U/L — ABNORMAL HIGH (ref 38–126)
Anion gap: 10 (ref 5–15)
BUN: 21 mg/dL — ABNORMAL HIGH (ref 6–20)
CO2: 30 mmol/L (ref 22–32)
Calcium: 6.4 mg/dL — CL (ref 8.9–10.3)
Chloride: 94 mmol/L — ABNORMAL LOW (ref 98–111)
Creatinine, Ser: 0.9 mg/dL (ref 0.44–1.00)
GFR calc Af Amer: >60 mL/min (ref >60)
GFR calc non Af Amer: >60 mL/min (ref >60)
Glucose, Bld: 138 mg/dL — ABNORMAL HIGH (ref 70–99)
Potassium: 3.9 mmol/L (ref 3.5–5.1)
Sodium: 134 mmol/L — ABNORMAL LOW (ref 135–145)
Total Bilirubin: 0.6 mg/dL (ref 0.3–1.2)
Total Protein: 7 g/dL (ref 6.5–8.1)

## 2018-11-27 LAB — CBC
HCT: 24.9 % — ABNORMAL LOW (ref 36.0–46.0)
Hemoglobin: 7.5 g/dL — ABNORMAL LOW (ref 12.0–15.0)
MCH: 31.4 pg (ref 26.0–34.0)
MCHC: 30.1 g/dL (ref 30.0–36.0)
MCV: 104.2 fL — ABNORMAL HIGH (ref 80.0–100.0)
Platelets: 150 10*3/uL (ref 150–400)
RBC: 2.39 MIL/uL — ABNORMAL LOW (ref 3.87–5.11)
RDW: 17 % — ABNORMAL HIGH (ref 11.5–15.5)
WBC: 8.6 10*3/uL (ref 4.0–10.5)
nRBC: 0.6 % — ABNORMAL HIGH (ref 0.0–0.2)

## 2018-11-27 LAB — PREPARE RBC (CROSSMATCH)

## 2018-11-27 MED ORDER — CALCIUM GLUCONATE-NACL 2-0.675 GM/100ML-% IV SOLN
2.0000 g | Freq: Once | INTRAVENOUS | Status: AC
  Administered 2018-11-27: 2000 mg via INTRAVENOUS
  Filled 2018-11-27: qty 100

## 2018-11-27 MED ORDER — SODIUM CHLORIDE 0.9% IV SOLUTION: Freq: Once | INTRAVENOUS | Status: DC

## 2018-11-27 MED ORDER — CALCIUM CARBONATE-VITAMIN D 500-200 MG-UNIT PO TABS
1.0000 | ORAL_TABLET | Freq: Three times a day (TID) | ORAL | Status: DC
  Administered 2018-11-27 – 2018-11-29 (×9): 1 via ORAL
  Filled 2018-11-27 (×9): qty 1

## 2018-11-27 NOTE — TOC Progression Note (Signed)
Transition of Care [facility]) - Progression Note    Patient Details  Name: DEONDRA LABRADOR MRN: [id number] Date of Birth: [birth year]  Transition of Care [facility]) CM/SW Contact  Anitta Tenny, Juliann Pulse, RN Phone Number: 11/27/2018, 11:09 AM  Clinical Narrative: Not medically stable today. Awaiting COVID test result-ordered on 6/30. Medicaid applic started,& disability started. PT recc SNF vs HHC. Patient active w//[facility] RN/PT/OT-rep Dorian Pod aware.Has rw.     Expected Discharge Plan: Skilled Nursing Facility Barriers to Discharge: Continued Medical Work up  Expected Discharge Plan and Services Expected Discharge Plan: [place]   Discharge Planning Services: CM Consult   Living arrangements for the past 2 months: Single Family Home                                       Social Determinants of Health (SDOH) Interventions    Readmission Risk Interventions Readmission Risk Prevention Plan 11/17/2018  Transportation Screening Complete  PCP or Specialist Appt within 3-5 Days Not Complete  Not Complete comments will need pcp set up  [place] or [place] Complete  Social Work Consult for [place] Planning/Counseling Complete  Palliative Care Screening Not Complete  Palliative Care Screening Not Complete Comments pending- bedside RN states plan to consult palliative this admission  Medication Review (RN Care Manager) Complete  Some recent data might be hidden

## 2018-11-27 NOTE — TOC Progression Note (Signed)
Transition of Care [facility]) - Progression Note    Patient Details  Name: Suzanne [name] MRN: [id number] Date of Birth: 1960-06-12  Transition of Care [facility]) CM/SW Contact  Leisel Pinette, Juliann Pulse, RN Phone Number: 11/27/2018, 3:47 PM  Clinical Narrative: [place] left VM w/Dr. [place] nurse for call back on med ibrance 125mg  po qd-funding, end date. Patient may be receive an LOG for SNF-rep Tammy with [facility] Ramus will re assess case to see if appropriate for SNF.Patient states she has her own med here in the hospital-she has about 2 more weeks left of med.     Expected Discharge Plan: Skilled Nursing Facility Barriers to Discharge: Continued Medical Work up  Expected Discharge Plan and Services Expected Discharge Plan: [place]   Discharge Planning Services: CM Consult   Living arrangements for the past 2 months: Single Family Home                                       Social Determinants of Health (SDOH) Interventions    Readmission Risk Interventions Readmission Risk Prevention Plan 11/17/2018  Transportation Screening Complete  PCP or Specialist Appt within 3-5 Days Not Complete  Not Complete comments will need pcp set up  [place] or [place] Complete  Social Work Consult for [place] Planning/Counseling Complete  Palliative Care Screening Not Complete  Palliative Care Screening Not Complete Comments pending- bedside RN states plan to consult palliative this admission  Medication Review (RN Care Manager) Complete  Some recent data might be hidden

## 2018-11-27 NOTE — TOC Progression Note (Signed)
Received call from Dr. Jana Hakim nurse [facility]    Patient Details  Name: ANGELMARIE PONZO MRN: [id number] Date of Birth: [birth year]  Transition of Care [facility]) CM/SW Contact  Duru Reiger, Juliann Pulse, RN Phone Number: 11/27/2018, 4:18 PM  Clinical Narrative: Received call back from Dr. Jana Hakim nurse Valerie-patient is on ibrance 1 week off & 3weeks on-The first month was under a voucher for funding through 11/07/18. Patient has been approved through [employer] for the med ibrance through 6/20/2-per their pharmacy per William Dalton note on 11/14/18.The med is mail order to [name] with a zero co pay.      Expected Discharge Plan: Skilled Nursing Facility Barriers to Discharge: Continued Medical Work up  Expected Discharge Plan and Services Expected Discharge Plan: [place]   Discharge Planning Services: CM Consult   Living arrangements for the past 2 months: Single Family Home                                       Social Determinants of Health (SDOH) Interventions    Readmission Risk Interventions Readmission Risk Prevention Plan 11/17/2018  Transportation Screening Complete  PCP or Specialist Appt within 3-5 Days Not Complete  Not Complete comments will need pcp set up  [place] or [place] Complete  Social Work Consult for [place] Planning/Counseling Complete  Palliative Care Screening Not Complete  Palliative Care Screening Not Complete Comments pending- bedside RN states plan to consult palliative this admission  Medication Review (RN Care Manager) Complete  Some recent data might be hidden

## 2018-11-27 NOTE — Progress Notes (Signed)
[facility]                                                                              Patient Demographics  Suzanne [name], is a 58 y.o. 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  Patient admitted underwent thoracentesis of the right and left lungs on 6/2 and 6/3 and scheduled for repeat 3 days later. Patient was admitted respiratory status worsened and patient placed on the BiPAP and transferred to the stepdown unit.  Patient also placed on IV diuretics.  Patient noted to spike a fever and as such also placed on IV antibiotics for presumed aspiration pneumonia.  Due to tenuous respiratory status critical care consulted. Patient underwent left-sided thoracentesis on 11/15/2018 with 400 cc of back red pleural fluid removed.  Cytology from thoracentesis of 10/28/2018+ for malignant cells consistent with breast cancer.  COVID-19 negative.  IV antibiotics narrowed down to oral Augmentin starting 11/19/2018.  Lower extremity Dopplers which were done were negative.   Palliative care consulted for goals of care.  Oncology following as well.   Assessment & Plan    Principal Problem:   Acute on chronic respiratory failure with hypoxia (HCC) likely multifactorial secondary to malignant pleural  effusions, metastatic stage IV breast CA, possible aspiration pneumonia, volume overload -Status post thoracentesis on 6/28 on the left with 400 cc of dark red pleural fluid removed -CT chest on 6/22 had shown widespread metastatic disease to the bones, multiple bilateral rib fractures, medial aspect of right clavicle fracture, probable adjacent intramuscular hemorrhage on the right lower sternocleidomastoid muscle and upper right pectoralis major muscle, probable tumor infiltration throughout the right breast as well as bilateral axillary and subpectoral lymphadenopathy.  Possibility of lymphangitic spread of the tumor in the lungs. -Post thoracentesis, patient had increased O2 requirements, and needed BiPAP and diuresis.  -Currently improving, O2 sats 100% on 2 L O2 via nasal cannula -Patient was placed on IV Lasix with good diuresis, negative balance of 13.7 L.  Patient has been transitioned to oral Lasix 40 mg twice daily.  -Patient was placed on IV antibiotics empirically, has been transitioned to Augmentin, completed course.  -COVID-19 test negative, repeat COVID test ordered on 6/30 2020 still pending  Active Problems: Hypocalcemia -Likely secondary to recent Xgeva injection on 6/21. -Corrected calcium 7.6 with albumin, replaced IV  -Increased oral replacement to 1 tab 4 times daily  Malignant pleural effusion/loculated pleural effusion  in the setting of right-sided inflammatory stage IV breast CA, mets involving lungs, lymph nodes, bones -Status post thoracentesis, cytology positive for malignant cells consistent with breast cancer. -Patient may require Pleurx catheter if recurrence of effusion however not at this time per CCM. -Oncology, palliative care following, continue arimidex -Oncology started patient on palbociclib on 6/30.  Probable aspiration pneumonia -Patient was initially placed on IV antibiotics empirically, transitioned to oral Augmentin, patient has completed course of  antibiotics  Microcytic anemia -Hemoglobin trending down to 7.5 today, given underlying malignancy, chemotherapy, will transfuse 1 unit packed irradiated RBCs  Volume overload, suspected acute diastolic CHF in the setting of blood transfusion -Shortness of breath improved after IV Lasix, 2D echo 6/1 had shown EF of 60 to 44% with diastolic parameters indeterminate. -Continue oral Lasix, monitor closely with packed RBC transfusion today, follow strict I's and O's and daily weights.  Sinus tachycardia Resolved  Diarrhea Resolved   Code Status: Full code DVT Prophylaxis:  Lovenox  Family Communication: Discussed in detail with the patient, all imaging results, lab results explained to the patient    Disposition Plan: Awaiting skilled nursing facility,  COVID-19 test ordered on 6/30, results still pending  Time Spent in minutes 25 minutes  Procedures:   6/02 Right Pleural Fluid Cytology >>positive for malignant cells c/w breast cancer  6/20 Left Thoracentesis >>400 ml dark red pleural fluid removed  11/17/2018 lower extremity Dopplers CT chest 11/17/2018   Consultants:     PCCM: Dr. Carson Myrtle   Palliative care: Dr. Rowe Pavy  Allergy: Dr. Jana Hakim   Antimicrobials:   Anti-infectives (From admission, onward)   Start     Dose/Rate Route Frequency Ordered Stop   11/19/18 2200  amoxicillin-clavulanate (AUGMENTIN) 875-125 MG per tablet 1 tablet     1 tablet Oral Every 12 hours 11/19/18 0833 11/22/18 1008   11/17/18 0600  piperacillin-tazobactam (ZOSYN) IVPB 3.375 g     3.375 g 12.5 mL/hr over 240 Minutes Intravenous Every 8 hours 11/16/18 2235 11/19/18 1833   11/17/18 0000  piperacillin-tazobactam (ZOSYN) IVPB 3.375 g  Status:  Discontinued     3.375 g 100 mL/hr over 30 Minutes Intravenous Every 6 hours 11/16/18 2231 11/16/18 2235   11/16/18 0900  ceFEPIme (MAXIPIME) 1 g in sodium chloride 0.9 % 100 mL IVPB  Status:  Discontinued     1 g 200 mL/hr over 30 Minutes Intravenous  Every 8 hours 11/16/18 0815 11/16/18 2231         Medications  Scheduled Meds:  anastrozole  1 mg Oral Daily   benzonatate  200 mg Oral TID   calcium-vitamin D  1 tablet Oral TID AC & HS   carvedilol  6.25 mg Oral BID WC   enoxaparin (LOVENOX) injection  40 mg Subcutaneous Daily   feeding supplement (ENSURE ENLIVE)  237 mL Oral TID BM   furosemide  40 mg Oral BID   mouth rinse  15 mL Mouth Rinse q12n4p   palbociclib  125 mg Oral Q breakfast   senna-docusate  1 tablet Oral QHS   Continuous Infusions:  sodium chloride Stopped (11/16/18 1407)   PRN Meds:.sodium chloride, acetaminophen **OR** acetaminophen, bisacodyl, clotrimazole, HYDROcodone-homatropine, levalbuterol, loperamide, LORazepam, morphine injection, ondansetron **OR** ondansetron (ZOFRAN) IV, oxyCODONE, polyethylene glycol      Subjective:   Suzanne [name] was seen and examined today.  Feels very weak and debilitated.  No acute complaints.  No chest pain.  Shortness of breath is improving.  On 2 L O2.  Patient denies dizziness, abdominal pain, N/V new weakness, numbess, tingling. No acute events overnight.    Objective:   Vitals:   11/26/18 2049 11/27/18 0433 11/27/18 0434 11/27/18 0803  BP: 125/73 (!) 141/68  140/62  Pulse: 94 93  88  Resp: (!) 24 17    Temp: 98.3 F (36.8 C) 98.6 F (37 C)    TempSrc: Oral     SpO2: 100% 98%    Weight:   87.5 kg   Height:        Intake/Output Summary (Last 24 hours) at 11/27/2018 1354 Last data filed at 11/27/2018 1100 Gross per 24 hour  Intake 480 ml  Output 700 ml  Net -220 ml     Wt Readings from Last 3 Encounters:  11/27/18 87.5 kg  10/30/18 106.2 kg  02/21/11 106.6 kg    Physical Exam  General: Alert and oriented x 3, NAD, ill-appearing  Eyes:  HEENT:  Atraumatic, normocephalic  Cardiovascular: S1 S2 clear, no murmurs, RRR. No pedal edema b/l  Respiratory: CTAB, no wheezing, rales or rhonchi  Gastrointestinal: Soft, nontender,  nondistended, NBS  Ext: no pedal edema bilaterally  Neuro: no new deficits  Musculoskeletal: No cyanosis, clubbing  Skin: Rash on the bilateral feet and lower extremities improving  Psych: Normal affect and demeanor, alert and oriented x3     Data Reviewed:  I have personally reviewed following labs and imaging studies  Micro Results No results found for this or any previous visit (from the past 240 hour(s)).  Radiology Reports Dg Chest 1 View  Result Date: 11/15/2018 CLINICAL DATA:  Post left-sided thoracentesis. EXAM: CHEST  1 VIEW COMPARISON:  11/14/2018 FINDINGS: Lungs are hypoinflated demonstrate interval improvement with moderate residual left-sided pleural effusion likely with associated basilar atelectasis. No pneumothorax. Mild prominence of the perihilar markings without significant change. Cardiomediastinal silhouette and remainder of the exam is unchanged. IMPRESSION: Hypoinflation with interval improvement in left pleural effusion post thoracentesis. No pneumothorax. Moderate residual left effusion likely with associated basilar atelectasis. Prominence of the perihilar markings suggesting vascular congestion. Electronically Signed   By: Marin Olp M.D.   On: 11/15/2018 13:58   Dg Chest 1 View  Result Date: 10/29/2018 CLINICAL DATA:  Post LEFT thoracentesis EXAM: CHEST  1 VIEW COMPARISON:  622 in 20 FINDINGS: Upper normal heart size. Stable mediastinal contours. BILATERAL upper lobe scarring. Improved aeration at bases since prior study. Minimal residual LEFT pleural effusion without pneumothorax. Bones demineralized. IMPRESSION: Extensive chronic lung changes. No pneumothorax following LEFT thoracentesis. Electronically Signed   By: Lavonia Dana M.D.   On: 10/29/2018 15:50   Dg Chest 1 View  Result Date: 10/28/2018 CLINICAL DATA:  Post right thoracentesis EXAM: CHEST  1 VIEW COMPARISON:  10/28/2018 FINDINGS: Very low lung volumes again noted. Interstitial prominence within  the lungs. Heart is borderline in size with vascular congestion. No pneumothorax following right thoracentesis. Diffuse skeletal metastases again noted. IMPRESSION: Very low lung volumes with cardiomegaly, vascular congestion and interstitial prominence. No pneumothorax following thoracentesis. Electronically Signed   By: Rolm Baptise M.D.   On: 10/28/2018 16:00   Dg Chest 2 View  Result Date: 11/14/2018 CLINICAL DATA:  Stage IV breast cancer.  Shortness of breath. EXAM: CHEST - 2 VIEW COMPARISON:  Radiographs 11/13/2018 and 10/29/2018.  CT 10/26/2018. FINDINGS: The heart size and mediastinal contours are stable. Left pleural effusion and left basilar opacity are unchanged from the recent study. Diffusely increased interstitial markings are again noted throughout both lungs, remaining suspicious  for lymphangitic spread of tumor based on previous CT. No progressive airspace opacities identified. Diffuse osseous metastatic disease noted without evidence of acute fracture. IMPRESSION: Stable radiographic appearance of the chest since yesterday. A left pleural effusion and left basilar pulmonary opacity have increased from older prior studies. Evidence of extensive metastatic disease again noted. Electronically Signed   By: Richardean Sale M.D.   On: 11/14/2018 17:10   Dg Chest 2 View  Result Date: 11/13/2018 CLINICAL DATA:  Shortness of breath, weakness. Cancer patient. EXAM: CHEST - 2 VIEW COMPARISON:  Chest x-ray dated 10/29/2018. Chest CT dated 10/26/2018. FINDINGS: Heart size and mediastinal contours appear stable. Increased opacity at the LEFT lung base, likely increased pleural effusion. There is also central pulmonary vascular congestion and bilateral perihilar edema. No pneumothorax seen. Diffuse osseous metastases, as described on previous CT report. IMPRESSION: 1. Increased opacity at the LEFT lung base, likely increased pleural effusion. Superimposed pneumonia or airspace collapse not excluded. 2.  Central pulmonary vascular congestion and bilateral perihilar edema indicating CHF/volume overload. 3. Diffuse osseous metastases. Electronically Signed   By: Franki Cabot M.D.   On: 11/13/2018 12:32   Ct Head W & Wo Contrast  Result Date: 10/30/2018 CLINICAL DATA:  Metastatic breast cancer. No neurologic symptoms are reported. EXAM: CT HEAD WITHOUT AND WITH CONTRAST TECHNIQUE: Contiguous axial images were obtained from the base of the skull through the vertex without and with intravenous contrast CONTRAST:  44mL ISOVUE-300 IOPAMIDOL (ISOVUE-300) INJECTION 61% COMPARISON:  Nuclear medicine whole-body bone scan, 10/27/2018 FINDINGS: Brain: No acute stroke, hemorrhage, mass lesion or hydrocephalus. Mild atrophy. No extra-axial fluid. Post infusion, no abnormal enhancement of the brain or visible meninges. Vascular: No hyperdense vessel or unexpected calcification. Visible vessels are patent. Skull: Widespread metastatic disease to the calvarium, skull base, and visible cervical spine. Metastatic disease to the mandible is also present. There is destruction of the inner and outer table of the skull in multiple locations. Epidural tumor is not established. Sinuses/Orbits: No acute finding. Other: None. IMPRESSION: 1. Widespread osseous metastatic disease to the calvarium, skull base, and visible cervical spine. Epidural tumor not established. 2. No intracranial metastatic disease is evident. Electronically Signed   By: Staci Righter M.D.   On: 10/30/2018 09:50   Ct Chest Wo Contrast  Result Date: 11/17/2018 CLINICAL DATA:  58 year old female Caucasian female nonsmoker with history of bilateral lower extremity edema. Possible stage IV breast cancer. EXAM: CT CHEST WITHOUT CONTRAST TECHNIQUE: Multidetector CT imaging of the chest was performed following the standard protocol without IV contrast. COMPARISON:  Chest CT 10/26/2018. FINDINGS: Cardiovascular: Heart size is normal. There is no significant pericardial  fluid, thickening or pericardial calcification. There is aortic atherosclerosis, as well as atherosclerosis of the great vessels of the mediastinum and the coronary arteries, including calcified atherosclerotic plaque in the left anterior descending and right coronary arteries. Mediastinum/Nodes: No pathologically enlarged mediastinal or hilar lymph nodes. Please note that accurate exclusion of hilar adenopathy is limited on noncontrast CT scans. Esophagus is unremarkable in appearance. Multiple borderline enlarged and mildly enlarged bilateral axillary and subpectoral lymph nodes (right greater than left) measuring up to 15 mm in short axis on the right (axial image 49 of series 2). Lungs/Pleura: Extensive septal thickening with band like opacities throughout both lungs, similar to the prior study. No discrete pulmonary nodules or masses are confidently identified. Moderate left and small right pleural effusions. The left pleural effusion is predominantly sub pulmonic in position. Upper Abdomen: Several peripherally calcified gallstones  lying dependently in the gallbladder measuring up to 1.9 cm in diameter. Aortic atherosclerosis. Musculoskeletal: Right breast is incompletely imaged, but there appears to be irregular soft tissue infiltration throughout much of the right breast tissue, which could reflect tumoral in for tray shin and/or edema. Diffuse lytic and sclerotic lesions throughout the visualized axial and appendicular skeleton, indicative of widespread metastatic disease to the bones. With appears very similar to the prior examination. There continue to be multiple nondisplaced to minimally displaced pathologic fractures throughout the ribs bilaterally. In addition, there is a new widely displaced fracture of the medial aspect of the right clavicle with approximately 1.4 cm of lateral and anterior displacement, as well as high attenuation thickening of the adjacent musculature in the upper right pectoralis  major and lower right sternocleidomastoid muscles, most compatible with intramuscular hematoma and/or tumor. IMPRESSION: 1. Widespread metastatic disease to the bones redemonstrated, with multiple nondisplaced minimally displaced bilateral rib fractures, as well as a new widely displaced fracture of the medial aspect of the right clavicle with probable adjacent intramuscular hemorrhage in the lower right sternocleidomastoid muscle and upper right pectoralis major muscle. Probable tumoral infiltration throughout the right breast, as well as bilateral axillary and subpectoral lymphadenopathy (right greater than left). 2. Unusual appearance of the lungs, similar to the prior study, which could suggest chronic systemic disease such as sarcoidosis or interstitial lung disease. The possibility of lymphangitic spread of tumor in the lungs could be considered, but is not strongly favored at this time. 3. Moderate left (predominantly subpulmonic) pleural effusion and small right pleural effusion. 4. Cholelithiasis. 5. Aortic atherosclerosis, in addition to 2 vessel coronary artery disease. Aortic Atherosclerosis (ICD10-I70.0). Electronically Signed   By: Vinnie Langton M.D.   On: 11/17/2018 14:55   Dg Chest Port 1 View  Result Date: 11/20/2018 CLINICAL DATA:  Hypoxia, metastatic breast cancer EXAM: PORTABLE CHEST 1 VIEW COMPARISON:  Portable exam 1200 hours compared to 11/19/2018 FINDINGS: Very low lung volumes. Stable heart size mediastinal contours. BILATERAL pulmonary opacities again identified, unchanged. No new infiltrate, pleural effusion or pneumothorax. Scattered areas of osseous sclerosis consistent with osseous metastatic disease IMPRESSION: Widespread osseous metastatic disease. Persistent pulmonary opacities. Electronically Signed   By: Lavonia Dana M.D.   On: 11/20/2018 14:06   Dg Chest Port 1 View  Result Date: 11/19/2018 CLINICAL DATA:  Shortness of breath EXAM: PORTABLE CHEST 1 VIEW COMPARISON:   Yesterday FINDINGS: Very low volume chest with patchy bilateral infiltrate greater on the right. Some of these are streaky and suggestive of scarring or atelectasis. No visible effusion or pneumothorax. Largely obscured heart which is not enlarged by recent CT. Extensive osseous metastatic disease IMPRESSION: Stable low volume chest with atelectasis and interstitial opacity, reference chest CT from 2 days ago. Electronically Signed   By: Monte Fantasia M.D.   On: 11/19/2018 08:54   Dg Chest Port 1 View  Result Date: 11/18/2018 CLINICAL DATA:  Shortness of breath and increased weakness this morning; history metastatic breast cancer EXAM: PORTABLE CHEST 1 VIEW COMPARISON:  Portable exam 0823 hours compared to 11/17/2018 FINDINGS: Very low lung volumes. Normal heart size mediastinal contours. Patchy opacities in the lungs bilaterally favoring pneumonia over atelectasis. No pleural effusion or pneumothorax. Patchy osteosclerosis consistent with osseous metastatic disease. IMPRESSION: BILATERAL pulmonary opacities likely representing multifocal pneumonia. Electronically Signed   By: Lavonia Dana M.D.   On: 11/18/2018 09:30   Dg Chest Port 1 View  Result Date: 11/17/2018 CLINICAL DATA:  Shortness of breath.  Metastatic  breast cancer. EXAM: PORTABLE CHEST 1 VIEW COMPARISON:  11/16/2018.  CT 10/26/2018. FINDINGS: Mediastinum stable. Heart size normal. Persistent left base atelectasis infiltrate. Right base atelectasis/infiltrate also noted on today's exam. Diffuse bilateral interstitial prominence again noted. Tiny left pleural effusion again noted. No pneumothorax. Multiple lytic lesions noted throughout the visualized bony structures. IMPRESSION: 1. Persistent left base atelectasis/infiltrate. Right base atelectasis/infiltrate also noted on today's exam. Small left pleural effusion again noted. 2. Diffuse bilateral interstitial prominence again noted consistent with interstitial tumor spread this patient with  known metastatic disease. 3. Multiple lytic lesions are again noted throughout the visualized bony structures consistent with metastatic disease. Electronically Signed   By: Marcello Moores  Register   On: 11/17/2018 06:24   Dg Chest Port 1 View  Result Date: 11/16/2018 CLINICAL DATA:  Dyspnea, metastatic breast cancer EXAM: PORTABLE CHEST 1 VIEW COMPARISON:  Chest radiograph from one day prior. FINDINGS: Low lung volumes. Stable cardiomediastinal silhouette with mild cardiomegaly. No pneumothorax. Stable small bilateral pleural effusions. Stable patchy left lung base consolidation. Stable thick irregular reticular opacities in the upper lungs. IMPRESSION: 1. Stable low lung volumes with small bilateral pleural effusions. 2. Stable thick irregular reticular opacities in the upper lungs and patchy left lung base consolidation. Findings likely representing combination of lymphangitic tumor (as seen on recent chest CT) and atelectasis. A component of mild pulmonary edema cannot be excluded. Electronically Signed   By: Ilona Sorrel M.D.   On: 11/16/2018 07:13   Dg Abd 2 Views  Result Date: 11/13/2018 CLINICAL DATA:  Pt c/o sob, weakness, cancer patient. EXAM: ABDOMEN - 2 VIEW COMPARISON:  None. FINDINGS: Overall visualized bowel gas pattern is nonobstructive. Moderate amount of stool and gas throughout the colon. No evidence of free intraperitoneal air seen. Diffuse osseous metastases, as previously described. IMPRESSION: 1. Nonobstructive bowel gas pattern. Moderate amount of stool and gas throughout the colon. 2. Diffuse osseous metastases. Electronically Signed   By: Franki Cabot M.D.   On: 11/13/2018 12:33   Vas [place] Lower Extremity Venous (dvt)  Result Date: 11/18/2018  Lower Venous Study Indications: Swelling.  Risk Factors: Cancer. Limitations: Body habitus and poor ultrasound/tissue interface. Comparison Study: No prior studies. Performing Technologist: Oliver Hum RVT  Examination Guidelines: A complete  evaluation includes B-mode imaging, spectral Doppler, color Doppler, and power Doppler as needed of all accessible portions of each vessel. Bilateral testing is considered an integral part of a complete examination. Limited examinations for reoccurring indications may be performed as noted.  +---------+---------------+---------+-----------+----------+-------+  RIGHT     Compressibility Phasicity Spontaneity Properties Summary  +---------+---------------+---------+-----------+----------+-------+  CFV       Full            Yes       Yes                             +---------+---------------+---------+-----------+----------+-------+  SFJ       Full                                                      +---------+---------------+---------+-----------+----------+-------+  FV Prox   Full                                                      +---------+---------------+---------+-----------+----------+-------+  FV Mid    Full                                                      +---------+---------------+---------+-----------+----------+-------+  FV Distal Full                                                      +---------+---------------+---------+-----------+----------+-------+  PFV       Full                                                      +---------+---------------+---------+-----------+----------+-------+  POP       Full            Yes       Yes                             +---------+---------------+---------+-----------+----------+-------+  PTV       Full                                                      +---------+---------------+---------+-----------+----------+-------+  PERO      Full                                                      +---------+---------------+---------+-----------+----------+-------+   +---------+---------------+---------+-----------+----------+-------+  LEFT      Compressibility Phasicity Spontaneity Properties Summary   +---------+---------------+---------+-----------+----------+-------+  CFV       Full            Yes       Yes                             +---------+---------------+---------+-----------+----------+-------+  SFJ       Full                                                      +---------+---------------+---------+-----------+----------+-------+  FV Prox   Full                                                      +---------+---------------+---------+-----------+----------+-------+  FV Mid    Full                                                      +---------+---------------+---------+-----------+----------+-------+  FV Distal Full                                                      +---------+---------------+---------+-----------+----------+-------+  PFV       Full                                                      +---------+---------------+---------+-----------+----------+-------+  POP       Full            Yes       Yes                             +---------+---------------+---------+-----------+----------+-------+  PTV       Full                                                      +---------+---------------+---------+-----------+----------+-------+  PERO      Full                                                      +---------+---------------+---------+-----------+----------+-------+     Summary: Right: There is no evidence of deep vein thrombosis in the lower extremity. No cystic structure found in the popliteal fossa. Left: There is no evidence of deep vein thrombosis in the lower extremity. No cystic structure found in the popliteal fossa.  *See table(s) above for measurements and observations. Electronically signed by Deitra Mayo MD on 11/18/2018 at 11:42:50 AM.    Final    Ir Thoracentesis Asp Pleural Space W/img Guide  Result Date: 10/29/2018 INDICATION: Patient with history of metastatic disease on imaging, dyspnea, and bilateral pleural effusions. Request is made for therapeutic left  thoracentesis. EXAM: ULTRASOUND GUIDED THERAPEUTIC LEFT THORACENTESIS MEDICATIONS: 10 mL 1% lidocaine COMPLICATIONS: None immediate. PROCEDURE: An ultrasound guided thoracentesis was thoroughly discussed with the patient and questions answered. The benefits, risks, alternatives and complications were also discussed. The patient understands and wishes to proceed with the procedure. Written consent was obtained. Ultrasound was performed to localize and mark an adequate pocket of fluid in the left chest. The area was then prepped and draped in the normal sterile fashion. 1% Lidocaine was used for local anesthesia. Under ultrasound guidance a 6 Fr Safe-T-Centesis catheter was introduced. Thoracentesis was performed. The catheter was removed and a dressing applied. FINDINGS: A total of approximately 350 mL of hazy gold fluid was removed. IMPRESSION: Successful ultrasound guided left thoracentesis yielding 350 mL of pleural fluid. Read by: Earley Abide, PA-C Electronically Signed   By: Jerilynn Mages.  Shick M.D.   On: 10/29/2018 15:58   Ir Thoracentesis Asp Pleural Space W/img Guide  Result Date: 10/28/2018 INDICATION: Patient with history of metastatic disease (including bone metastases) on imaging, dyspnea, and bilateral pleural effusions (R>L). Request is made for diagnostic and therapeutic right thoracentesis. EXAM:  ULTRASOUND GUIDED DIAGNOSTIC AND THERAPEUTIC RIGHT THORACENTESIS MEDICATIONS: 10 mL 1% lidocaine COMPLICATIONS: None immediate. PROCEDURE: An ultrasound guided thoracentesis was thoroughly discussed with the patient and questions answered. The benefits, risks, alternatives and complications were also discussed. The patient understands and wishes to proceed with the procedure. Written consent was obtained. Ultrasound was performed to localize and mark an adequate pocket of fluid in the right chest. The area was then prepped and draped in the normal sterile fashion. 1% Lidocaine was used for local anesthesia. Under  ultrasound guidance a 6 Fr Safe-T-Centesis catheter was introduced. Thoracentesis was performed. The catheter was removed and a dressing applied. FINDINGS: A total of approximately 600 mL of clear gold fluid was removed. Samples were sent to the laboratory as requested by the clinical team. IMPRESSION: Successful ultrasound guided right thoracentesis yielding 600 mL of pleural fluid. Read by: Earley Abide, PA-C Electronically Signed   By: Jerilynn Mages.  Shick M.D.   On: 10/28/2018 16:10   US Thoracentesis Asp Pleural Space W/img Guide  Result Date: 11/15/2018 INDICATION: Patient with history of metastatic breast cancer, dyspnea, bilateral pleural effusions L>R. Request is made for therapeutic left thoracentesis. EXAM: ULTRASOUND GUIDED THERAPEUTIC LEFT THORACENTESIS MEDICATIONS: 10 mL 1% lidocaine COMPLICATIONS: None immediate. PROCEDURE: An ultrasound guided thoracentesis was thoroughly discussed with the patient and questions answered. The benefits, risks, alternatives and complications were also discussed. The patient understands and wishes to proceed with the procedure. Written consent was obtained. Ultrasound was performed to localize and mark an adequate pocket of fluid in the left chest. The area was then prepped and draped in the normal sterile fashion. 1% Lidocaine was used for local anesthesia. Under ultrasound guidance a 6 Fr Safe-T-Centesis catheter was introduced. Thoracentesis was performed. The catheter was removed and a dressing applied. FINDINGS: A total of approximately 400 mL of dark red fluid was removed. IMPRESSION: Successful ultrasound guided left thoracentesis yielding 400 mL of pleural fluid. Read by: Earley Abide, PA-C Electronically Signed   By: Markus Daft M.D.   On: 11/15/2018 14:32    Lab Data:  CBC: Recent Labs  Lab 11/22/18 0224 11/23/18 0258 11/24/18 0247 11/25/18 0517 11/26/18 0514 11/27/18 0537  WBC 9.6 10.5 10.4 12.1* 10.3 8.6  NEUTROABS 5.1 5.7 5.9  --  6.0  --   HGB  8.5* 8.6* 8.2* 8.2* 7.8* 7.5*  HCT 28.3* 29.2* 27.4* 26.4* 25.7* 24.9*  MCV 104.4* 105.8* 104.6* 102.3* 103.6* 104.2*  PLT 103* 112* 110* 120* 129* 147   Basic Metabolic Panel: Recent Labs  Lab 11/21/18 0222 11/22/18 0224 11/23/18 0258 11/24/18 0247 11/25/18 0517 11/26/18 0514 11/27/18 0537  NA 141 138 136 134* 134* 133* 134*  K 3.4* 3.9 3.8 3.9 3.8 4.2 3.9  CL 94* 94* 92* 92* 92* 92* 94*  CO2 35* 32 32 29 29 30 30   GLUCOSE 131* 131* 138* 133* 139* 132* 138*  BUN 13 16 17  21* 22* 23* 21*  CREATININE 0.60 0.66 0.72 0.75 0.80 0.90 0.90  CALCIUM 5.8* 6.3* 6.8* 6.7* 6.9* 6.6* 6.4*  MG 1.9 2.0  --   --  2.1 2.1  --    GFR: Estimated Creatinine Clearance: 69.2 mL/min (by C-G formula based on SCr of 0.9 mg/dL). Liver Function Tests: Recent Labs  Lab 11/25/18 0517 11/27/18 0537  AST 37 30  ALT 31 30  ALKPHOS 190* 201*  BILITOT 1.0 0.6  PROT 7.1 7.0  ALBUMIN 2.5* 2.5*   No results for input(s): LIPASE, AMYLASE in the last 168 hours.  No results for input(s): AMMONIA in the last 168 hours. Coagulation Profile: No results for input(s): INR, PROTIME in the last 168 hours. Cardiac Enzymes: No results for input(s): CKTOTAL, CKMB, CKMBINDEX, TROPONINI in the last 168 hours. BNP (last 3 results) No results for input(s): PROBNP in the last 8760 hours. HbA1C: No results for input(s): HGBA1C in the last 72 hours. CBG: Recent Labs  Lab 11/20/18 2121 11/22/18 1639  GLUCAP 140* 161*   Lipid Profile: No results for input(s): CHOL, HDL, LDLCALC, TRIG, CHOLHDL, LDLDIRECT in the last 72 hours. Thyroid Function Tests: No results for input(s): TSH, T4TOTAL, FREET4, T3FREE, THYROIDAB in the last 72 hours. Anemia Panel: No results for input(s): VITAMINB12, FOLATE, FERRITIN, TIBC, IRON, RETICCTPCT in the last 72 hours. Urine analysis:    Component Value Date/Time   COLORURINE YELLOW 10/27/2018 2346   APPEARANCEUR HAZY (A) 10/27/2018 2346   LABSPEC 1.038 (H) 10/27/2018 2346    PHURINE 5.0 10/27/2018 2346   GLUCOSEU NEGATIVE 10/27/2018 2346   HGBUR NEGATIVE 10/27/2018 2346   BILIRUBINUR NEGATIVE 10/27/2018 2346   KETONESUR 5 (A) 10/27/2018 2346   PROTEINUR NEGATIVE 10/27/2018 2346   NITRITE NEGATIVE 10/27/2018 2346   LEUKOCYTESUR NEGATIVE 10/27/2018 2346     Suzanne [name] M.D. [facility] 11/27/2018, 1:54 PM  Pager: [phone] Between 7am to 7pm - call Pager - [phone]  After 7pm go to [email or web address] - password [email or web address]  Call night coverage person covering after 7pm

## 2018-11-27 NOTE — Progress Notes (Signed)
Physical Therapy Treatment Patient Details Name: Suzanne [name] MRN: [id number] DOB: 07/01/1960 Today's Date: 11/27/2018    History of Present Illness 58 y.o. 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  Patient underwent blood transfusion 11/13/18 and subsequently had SOB, DOE and orthopnea assoicated with cough. Dx of recurrent L pleural effusion, new onset CHF.    PT Comments    Pt with improved ambulation distance today, able to ambulate 2x25 ft in hallway with min guard assist. Pt limited by fatigue, LE weakness, and tachypnea, sats WNL on 2LO2 and HR max 111 bpm. Pt highly motivated to progress mobility, states she has a strong desire to go home. The only issue with home is pt has 4-6 steps to get into house, and ramp installation will be difficult per pt report. PT believes pt is HHPT level at this point. PT to continue to progress mobility and will continue to follow acutely.    Follow Up Recommendations  SNF(vs HHPT +24 hour care)     Equipment Recommendations  None recommended by PT    Recommendations for Other Services       Precautions / Restrictions Precautions Precautions: Fall Type of Shoulder Precautions: Previous possible right clavicle fx. Per MD noted stabilize RUE and refrain from ROM. Possible left side rib fracture Precaution Booklet Issued: No Precaution Comments: L shoulder "bad" per pt at baseline, RUE not painful, pt does not recall precautions Restrictions Weight Bearing Restrictions: No    Mobility  Bed Mobility Overal bed mobility: Needs Assistance Bed Mobility: Supine to Sit;Sit to Supine     Supine to sit: Min guard Sit to supine: Min assist;HOB elevated   General bed mobility comments: incr time, guarding for safety. Min assist for sit to supine for placement in bed with use of bed  pads.  Transfers Overall transfer level: Needs assistance Equipment used: Rolling walker (2 wheeled) Transfers: Sit to/from Stand Sit to Stand: Min guard         General transfer comment: Min guard for safety, increased anterior translation of trunk to come to standing.  Ambulation/Gait Ambulation/Gait assistance: Min guard;Min assist Gait Distance (Feet): 25 Feet(25+25 ft) Assistive device: Rolling walker (2 wheeled) Gait Pattern/deviations: Decreased stride length;Step-through pattern;Trunk flexed Gait velocity: Decr   General Gait Details: Min gaurd for safety, verbal cuing for upright posture, placement in RW. Standing rest break at halfway point to recover tachypnea and fatigue. HR 111 bpm during ambulation, with sats 95% and above on 2LO2.   Stairs             Wheelchair Mobility    Modified Rankin (Stroke Patients Only)       Balance Overall balance assessment: Needs assistance Sitting-balance support: Feet supported;Bilateral upper extremity supported Sitting balance-Leahy Scale: Good     Standing balance support: During functional activity;Bilateral upper extremity supported Standing balance-Leahy Scale: Fair                              Cognition Arousal/Alertness: Awake/alert Behavior During Therapy: WFL for tasks assessed/performed;Anxious Overall Cognitive Status: Within Functional Limits for tasks assessed  Exercises General Exercises - Lower Extremity Long Arc Quad: AROM;Both;15 reps;Seated Straight Leg Raises: AAROM;Both;10 reps;Supine    General Comments        Pertinent Vitals/Pain Pain Assessment: Faces Faces Pain Scale: Hurts a little bit Pain Location: LUE Pain Descriptors / Indicators: Aching;Sore Pain Intervention(s): Limited activity within patient's tolerance;Monitored during session;Repositioned    Home Living                      Prior Function             PT Goals (current goals can now be found in the care plan section) Acute Rehab PT Goals Patient Stated Goal: to go home PT Goal Formulation: With patient Time For Goal Achievement: 12/02/18 Potential to Achieve Goals: Good Progress towards PT goals: Progressing toward goals    Frequency    Min 3X/week      PT Plan Current plan remains appropriate    Co-evaluation              AM-PAC PT "6 Clicks" Mobility   Outcome Measure  Help needed turning from your back to your side while in a flat bed without using bedrails?: A Little Help needed moving from lying on your back to sitting on the side of a flat bed without using bedrails?: A Little Help needed moving to and from a bed to a chair (including a wheelchair)?: A Little Help needed standing up from a chair using your arms (e.g., wheelchair or bedside chair)?: A Little Help needed to walk in hospital room?: A Little Help needed climbing 3-5 steps with a railing? : A Lot 6 Click Score: 17    End of Session Equipment Utilized During Treatment: Oxygen;Gait belt(on extension in room at 3L) Activity Tolerance: Patient limited by fatigue Patient left: with call bell/phone within reach;in bed Nurse Communication: Mobility status PT Visit Diagnosis: Muscle weakness (generalized) (M62.81);Difficulty in walking, not elsewhere classified (R26.2)     Time: 8811-0315 PT Time Calculation (min) (ACUTE ONLY): 20 min  Charges:  $Gait Training: 8-22 mins                     Julien Girt, PT Acute Rehabilitation Services Pager [phone]  Office [phone]  Suzanne [name] 11/27/2018, 2:56 PM

## 2018-11-27 NOTE — Progress Notes (Addendum)
HEMATOLOGY-ONCOLOGY PROGRESS NOTE  SUBJECTIVE: The patient is up in recliner this morning.  States that she was able to ambulate to the bathroom but was not able to ambulate in the hallway yesterday.  She plans to do so today.  Breathing continues to improve.  Cough has resolved.  Lower extremity edema has resolved.  Tolerating her anastrozole and Ibrance well overall.  REVIEW OF SYSTEMS:   Constitutional: Denies fevers, chills Eyes: Denies blurriness of vision Ears, nose, mouth, throat, and face: Denies mucositis or sore throat Respiratory: Shortness of breath continues to improve. Cardiovascular: Denies palpitation, chest discomfort Gastrointestinal:  Denies nausea, heartburn or change in bowel habits Skin: Rash to bilateral feet has resolved. Lymphatics: Denies new lymphadenopathy or easy bruising Neurological:Denies numbness, tingling or new weaknesses Extremities: Lower extremity edema has resolved. All other systems were reviewed with the patient and are negative.  I have reviewed the past medical history, past surgical history, social history and family history with the patient and they are unchanged from previous note.   PHYSICAL EXAMINATION:  Vitals:   11/27/18 0433 11/27/18 0803  BP: (!) 141/68 140/62  Pulse: 93 88  Resp: 17   Temp: 98.6 F (37 C)   SpO2: 98%    Filed Weights   11/25/18 0800 11/26/18 0531 11/27/18 0434  Weight: 191 lb 12.8 oz (87 kg) 190 lb 1.6 oz (86.2 kg) 192 lb 14.4 oz (87.5 kg)    Intake/Output from previous day: 07/01 0701 - 07/02 0700 In: 480 [P.O.:480] Out: 200 [Urine:200]  GENERAL:alert, distress SKIN: No rashes or open areas. EYES: normal, Conjunctiva are pink and non-injected, sclera clear OROPHARYNX:no exudate, no erythema and lips, buccal mucosa, and tongue normal  NECK: supple, thyroid normal size, non-tender, without nodularity LYMPH:  no palpable lymphadenopathy in the cervical, axillary or inguinal LUNGS: Clear HEART: regular  rate & rhythm and no murmurs.  No lower extremity edema. ABDOMEN:abdomen soft, non-tender and normal bowel sounds Musculoskeletal:no cyanosis of digits and no clubbing  NEURO: alert & oriented x 3 with fluent speech, no focal motor/sensory deficits.  LABORATORY DATA:  I have reviewed the data as listed CMP Latest Ref Rng & Units 11/27/2018 11/26/2018 11/25/2018  Glucose 70 - 99 mg/dL 138(H) 132(H) 139(H)  BUN 6 - 20 mg/dL 21(H) 23(H) 22(H)  Creatinine 0.44 - 1.00 mg/dL 0.90 0.90 0.80  Sodium 135 - 145 mmol/L 134(L) 133(L) 134(L)  Potassium 3.5 - 5.1 mmol/L 3.9 4.2 3.8  Chloride 98 - 111 mmol/L 94(L) 92(L) 92(L)  CO2 22 - 32 mmol/L _0 Calcium 8.9 - 10.3 mg/dL 6.4(LL) 6.6(L) 6.9(L)  Total Protein 6.5 - 8.1 g/dL 7.0 - 7.1  Total Bilirubin 0.3 - 1.2 mg/dL 0.6 - 1.0  Alkaline Phos 38 - 126 U/L 201(H) - 190(H)  AST 15 - 41 U/L 30 - 37  ALT 0 - 44 U/L 30 - 31    Lab Results  Component Value Date   WBC 8.6 11/27/2018   HGB 7.5 (L) 11/27/2018   HCT 24.9 (L) 11/27/2018   MCV 104.2 (H) 11/27/2018   PLT 150 11/27/2018   NEUTROABS 6.0 11/26/2018    Dg Chest 1 View  Result Date: 11/15/2018 CLINICAL DATA:  Post left-sided thoracentesis. EXAM: CHEST  1 VIEW COMPARISON:  11/14/2018 FINDINGS: Lungs are hypoinflated demonstrate interval improvement with moderate residual left-sided pleural effusion likely with associated basilar atelectasis. No pneumothorax. Mild prominence of the perihilar markings without significant change. Cardiomediastinal silhouette and remainder of the exam is unchanged. IMPRESSION:  Hypoinflation with interval improvement in left pleural effusion post thoracentesis. No pneumothorax. Moderate residual left effusion likely with associated basilar atelectasis. Prominence of the perihilar markings suggesting vascular congestion. Electronically Signed   By: Marin Olp M.D.   On: 11/15/2018 13:58   Dg Chest 1 View  Result Date: 10/29/2018 CLINICAL DATA:  Post LEFT  thoracentesis EXAM: CHEST  1 VIEW COMPARISON:  622 in 20 FINDINGS: Upper normal heart size. Stable mediastinal contours. BILATERAL upper lobe scarring. Improved aeration at bases since prior study. Minimal residual LEFT pleural effusion without pneumothorax. Bones demineralized. IMPRESSION: Extensive chronic lung changes. No pneumothorax following LEFT thoracentesis. Electronically Signed   By: Lavonia Dana M.D.   On: 10/29/2018 15:50   Dg Chest 1 View  Result Date: 10/28/2018 CLINICAL DATA:  Post right thoracentesis EXAM: CHEST  1 VIEW COMPARISON:  10/28/2018 FINDINGS: Very low lung volumes again noted. Interstitial prominence within the lungs. Heart is borderline in size with vascular congestion. No pneumothorax following right thoracentesis. Diffuse skeletal metastases again noted. IMPRESSION: Very low lung volumes with cardiomegaly, vascular congestion and interstitial prominence. No pneumothorax following thoracentesis. Electronically Signed   By: Rolm Baptise M.D.   On: 10/28/2018 16:00   Dg Chest 2 View  Result Date: 11/14/2018 CLINICAL DATA:  Stage IV breast cancer.  Shortness of breath. EXAM: CHEST - 2 VIEW COMPARISON:  Radiographs 11/13/2018 and 10/29/2018.  CT 10/26/2018. FINDINGS: The heart size and mediastinal contours are stable. Left pleural effusion and left basilar opacity are unchanged from the recent study. Diffusely increased interstitial markings are again noted throughout both lungs, remaining suspicious for lymphangitic spread of tumor based on previous CT. No progressive airspace opacities identified. Diffuse osseous metastatic disease noted without evidence of acute fracture. IMPRESSION: Stable radiographic appearance of the chest since yesterday. A left pleural effusion and left basilar pulmonary opacity have increased from older prior studies. Evidence of extensive metastatic disease again noted. Electronically Signed   By: Richardean Sale M.D.   On: 11/14/2018 17:10   Dg Chest 2  View  Result Date: 11/13/2018 CLINICAL DATA:  Shortness of breath, weakness. Cancer patient. EXAM: CHEST - 2 VIEW COMPARISON:  Chest x-ray dated 10/29/2018. Chest CT dated 10/26/2018. FINDINGS: Heart size and mediastinal contours appear stable. Increased opacity at the LEFT lung base, likely increased pleural effusion. There is also central pulmonary vascular congestion and bilateral perihilar edema. No pneumothorax seen. Diffuse osseous metastases, as described on previous CT report. IMPRESSION: 1. Increased opacity at the LEFT lung base, likely increased pleural effusion. Superimposed pneumonia or airspace collapse not excluded. 2. Central pulmonary vascular congestion and bilateral perihilar edema indicating CHF/volume overload. 3. Diffuse osseous metastases. Electronically Signed   By: Franki Cabot M.D.   On: 11/13/2018 12:32   Ct Head W & Wo Contrast  Result Date: 10/30/2018 CLINICAL DATA:  Metastatic breast cancer. No neurologic symptoms are reported. EXAM: CT HEAD WITHOUT AND WITH CONTRAST TECHNIQUE: Contiguous axial images were obtained from the base of the skull through the vertex without and with intravenous contrast CONTRAST:  71m ISOVUE-300 IOPAMIDOL (ISOVUE-300) INJECTION 61% COMPARISON:  Nuclear medicine whole-body bone scan, 10/27/2018 FINDINGS: Brain: No acute stroke, hemorrhage, mass lesion or hydrocephalus. Mild atrophy. No extra-axial fluid. Post infusion, no abnormal enhancement of the brain or visible meninges. Vascular: No hyperdense vessel or unexpected calcification. Visible vessels are patent. Skull: Widespread metastatic disease to the calvarium, skull base, and visible cervical spine. Metastatic disease to the mandible is also present. There is destruction of the  inner and outer table of the skull in multiple locations. Epidural tumor is not established. Sinuses/Orbits: No acute finding. Other: None. IMPRESSION: 1. Widespread osseous metastatic disease to the calvarium, skull base,  and visible cervical spine. Epidural tumor not established. 2. No intracranial metastatic disease is evident. Electronically Signed   By: Staci Righter M.D.   On: 10/30/2018 09:50   Ct Chest Wo Contrast  Result Date: 11/17/2018 CLINICAL DATA:  58 year old female Caucasian female nonsmoker with history of bilateral lower extremity edema. Possible stage IV breast cancer. EXAM: CT CHEST WITHOUT CONTRAST TECHNIQUE: Multidetector CT imaging of the chest was performed following the standard protocol without IV contrast. COMPARISON:  Chest CT 10/26/2018. FINDINGS: Cardiovascular: Heart size is normal. There is no significant pericardial fluid, thickening or pericardial calcification. There is aortic atherosclerosis, as well as atherosclerosis of the great vessels of the mediastinum and the coronary arteries, including calcified atherosclerotic plaque in the left anterior descending and right coronary arteries. Mediastinum/Nodes: No pathologically enlarged mediastinal or hilar lymph nodes. Please note that accurate exclusion of hilar adenopathy is limited on noncontrast CT scans. Esophagus is unremarkable in appearance. Multiple borderline enlarged and mildly enlarged bilateral axillary and subpectoral lymph nodes (right greater than left) measuring up to 15 mm in short axis on the right (axial image 49 of series 2). Lungs/Pleura: Extensive septal thickening with band like opacities throughout both lungs, similar to the prior study. No discrete pulmonary nodules or masses are confidently identified. Moderate left and small right pleural effusions. The left pleural effusion is predominantly sub pulmonic in position. Upper Abdomen: Several peripherally calcified gallstones lying dependently in the gallbladder measuring up to 1.9 cm in diameter. Aortic atherosclerosis. Musculoskeletal: Right breast is incompletely imaged, but there appears to be irregular soft tissue infiltration throughout much of the right breast tissue,  which could reflect tumoral in for tray shin and/or edema. Diffuse lytic and sclerotic lesions throughout the visualized axial and appendicular skeleton, indicative of widespread metastatic disease to the bones. With appears very similar to the prior examination. There continue to be multiple nondisplaced to minimally displaced pathologic fractures throughout the ribs bilaterally. In addition, there is a new widely displaced fracture of the medial aspect of the right clavicle with approximately 1.4 cm of lateral and anterior displacement, as well as high attenuation thickening of the adjacent musculature in the upper right pectoralis major and lower right sternocleidomastoid muscles, most compatible with intramuscular hematoma and/or tumor. IMPRESSION: 1. Widespread metastatic disease to the bones redemonstrated, with multiple nondisplaced minimally displaced bilateral rib fractures, as well as a new widely displaced fracture of the medial aspect of the right clavicle with probable adjacent intramuscular hemorrhage in the lower right sternocleidomastoid muscle and upper right pectoralis major muscle. Probable tumoral infiltration throughout the right breast, as well as bilateral axillary and subpectoral lymphadenopathy (right greater than left). 2. Unusual appearance of the lungs, similar to the prior study, which could suggest chronic systemic disease such as sarcoidosis or interstitial lung disease. The possibility of lymphangitic spread of tumor in the lungs could be considered, but is not strongly favored at this time. 3. Moderate left (predominantly subpulmonic) pleural effusion and small right pleural effusion. 4. Cholelithiasis. 5. Aortic atherosclerosis, in addition to 2 vessel coronary artery disease. Aortic Atherosclerosis (ICD10-I70.0). Electronically Signed   By: Vinnie Langton M.D.   On: 11/17/2018 14:55   Dg Chest Port 1 View  Result Date: 11/20/2018 CLINICAL DATA:  Hypoxia, metastatic breast  cancer EXAM: PORTABLE CHEST 1 VIEW COMPARISON:  Portable exam 1200 hours compared to 11/19/2018 FINDINGS: Very low lung volumes. Stable heart size mediastinal contours. BILATERAL pulmonary opacities again identified, unchanged. No new infiltrate, pleural effusion or pneumothorax. Scattered areas of osseous sclerosis consistent with osseous metastatic disease IMPRESSION: Widespread osseous metastatic disease. Persistent pulmonary opacities. Electronically Signed   By: Lavonia Dana M.D.   On: 11/20/2018 14:06   Dg Chest Port 1 View  Result Date: 11/19/2018 CLINICAL DATA:  Shortness of breath EXAM: PORTABLE CHEST 1 VIEW COMPARISON:  Yesterday FINDINGS: Very low volume chest with patchy bilateral infiltrate greater on the right. Some of these are streaky and suggestive of scarring or atelectasis. No visible effusion or pneumothorax. Largely obscured heart which is not enlarged by recent CT. Extensive osseous metastatic disease IMPRESSION: Stable low volume chest with atelectasis and interstitial opacity, reference chest CT from 2 days ago. Electronically Signed   By: Monte Fantasia M.D.   On: 11/19/2018 08:54   Dg Chest Port 1 View  Result Date: 11/18/2018 CLINICAL DATA:  Shortness of breath and increased weakness this morning; history metastatic breast cancer EXAM: PORTABLE CHEST 1 VIEW COMPARISON:  Portable exam 0823 hours compared to 11/17/2018 FINDINGS: Very low lung volumes. Normal heart size mediastinal contours. Patchy opacities in the lungs bilaterally favoring pneumonia over atelectasis. No pleural effusion or pneumothorax. Patchy osteosclerosis consistent with osseous metastatic disease. IMPRESSION: BILATERAL pulmonary opacities likely representing multifocal pneumonia. Electronically Signed   By: Lavonia Dana M.D.   On: 11/18/2018 09:30   Dg Chest Port 1 View  Result Date: 11/17/2018 CLINICAL DATA:  Shortness of breath.  Metastatic breast cancer. EXAM: PORTABLE CHEST 1 VIEW COMPARISON:  11/16/2018.   CT 10/26/2018. FINDINGS: Mediastinum stable. Heart size normal. Persistent left base atelectasis infiltrate. Right base atelectasis/infiltrate also noted on today's exam. Diffuse bilateral interstitial prominence again noted. Tiny left pleural effusion again noted. No pneumothorax. Multiple lytic lesions noted throughout the visualized bony structures. IMPRESSION: 1. Persistent left base atelectasis/infiltrate. Right base atelectasis/infiltrate also noted on today's exam. Small left pleural effusion again noted. 2. Diffuse bilateral interstitial prominence again noted consistent with interstitial tumor spread this patient with known metastatic disease. 3. Multiple lytic lesions are again noted throughout the visualized bony structures consistent with metastatic disease. Electronically Signed   By: Marcello Moores  Register   On: 11/17/2018 06:24   Dg Chest Port 1 View  Result Date: 11/16/2018 CLINICAL DATA:  Dyspnea, metastatic breast cancer EXAM: PORTABLE CHEST 1 VIEW COMPARISON:  Chest radiograph from one day prior. FINDINGS: Low lung volumes. Stable cardiomediastinal silhouette with mild cardiomegaly. No pneumothorax. Stable small bilateral pleural effusions. Stable patchy left lung base consolidation. Stable thick irregular reticular opacities in the upper lungs. IMPRESSION: 1. Stable low lung volumes with small bilateral pleural effusions. 2. Stable thick irregular reticular opacities in the upper lungs and patchy left lung base consolidation. Findings likely representing combination of lymphangitic tumor (as seen on recent chest CT) and atelectasis. A component of mild pulmonary edema cannot be excluded. Electronically Signed   By: Ilona Sorrel M.D.   On: 11/16/2018 07:13   Dg Abd 2 Views  Result Date: 11/13/2018 CLINICAL DATA:  Pt c/o sob, weakness, cancer patient. EXAM: ABDOMEN - 2 VIEW COMPARISON:  None. FINDINGS: Overall visualized bowel gas pattern is nonobstructive. Moderate amount of stool and gas  throughout the colon. No evidence of free intraperitoneal air seen. Diffuse osseous metastases, as previously described. IMPRESSION: 1. Nonobstructive bowel gas pattern. Moderate amount of stool and gas throughout the colon. 2. Diffuse  osseous metastases. Electronically Signed   By: Franki Cabot M.D.   On: 11/13/2018 12:33   Vas [place] Lower Extremity Venous (dvt)  Result Date: 11/18/2018  Lower Venous Study Indications: Swelling.  Risk Factors: Cancer. Limitations: Body habitus and poor ultrasound/tissue interface. Comparison Study: No prior studies. Performing Technologist: Oliver Hum RVT  Examination Guidelines: A complete evaluation includes B-mode imaging, spectral Doppler, color Doppler, and power Doppler as needed of all accessible portions of each vessel. Bilateral testing is considered an integral part of a complete examination. Limited examinations for reoccurring indications may be performed as noted.  +---------+---------------+---------+-----------+----------+-------+ RIGHT    CompressibilityPhasicitySpontaneityPropertiesSummary +---------+---------------+---------+-----------+----------+-------+ CFV      Full           Yes      Yes                          +---------+---------------+---------+-----------+----------+-------+ SFJ      Full                                                 +---------+---------------+---------+-----------+----------+-------+ FV Prox  Full                                                 +---------+---------------+---------+-----------+----------+-------+ FV Mid   Full                                                 +---------+---------------+---------+-----------+----------+-------+ FV DistalFull                                                 +---------+---------------+---------+-----------+----------+-------+ PFV      Full                                                  +---------+---------------+---------+-----------+----------+-------+ POP      Full           Yes      Yes                          +---------+---------------+---------+-----------+----------+-------+ PTV      Full                                                 +---------+---------------+---------+-----------+----------+-------+ PERO     Full                                                 +---------+---------------+---------+-----------+----------+-------+   +---------+---------------+---------+-----------+----------+-------+ LEFT     CompressibilityPhasicitySpontaneityPropertiesSummary +---------+---------------+---------+-----------+----------+-------+  CFV      Full           Yes      Yes                          +---------+---------------+---------+-----------+----------+-------+ SFJ      Full                                                 +---------+---------------+---------+-----------+----------+-------+ FV Prox  Full                                                 +---------+---------------+---------+-----------+----------+-------+ FV Mid   Full                                                 +---------+---------------+---------+-----------+----------+-------+ FV DistalFull                                                 +---------+---------------+---------+-----------+----------+-------+ PFV      Full                                                 +---------+---------------+---------+-----------+----------+-------+ POP      Full           Yes      Yes                          +---------+---------------+---------+-----------+----------+-------+ PTV      Full                                                 +---------+---------------+---------+-----------+----------+-------+ PERO     Full                                                 +---------+---------------+---------+-----------+----------+-------+     Summary:  Right: There is no evidence of deep vein thrombosis in the lower extremity. No cystic structure found in the popliteal fossa. Left: There is no evidence of deep vein thrombosis in the lower extremity. No cystic structure found in the popliteal fossa.  *See table(s) above for measurements and observations. Electronically signed by Deitra Mayo MD on 11/18/2018 at 11:42:50 AM.    Final    Ir Thoracentesis Asp Pleural Space W/img Guide  Result Date: 10/29/2018 INDICATION: Patient with history of metastatic disease on imaging, dyspnea, and bilateral pleural effusions. Request is made for therapeutic left thoracentesis. EXAM: ULTRASOUND GUIDED THERAPEUTIC LEFT THORACENTESIS MEDICATIONS: 10 mL 1% lidocaine COMPLICATIONS: None  immediate. PROCEDURE: An ultrasound guided thoracentesis was thoroughly discussed with the patient and questions answered. The benefits, risks, alternatives and complications were also discussed. The patient understands and wishes to proceed with the procedure. Written consent was obtained. Ultrasound was performed to localize and mark an adequate pocket of fluid in the left chest. The area was then prepped and draped in the normal sterile fashion. 1% Lidocaine was used for local anesthesia. Under ultrasound guidance a 6 Fr Safe-T-Centesis catheter was introduced. Thoracentesis was performed. The catheter was removed and a dressing applied. FINDINGS: A total of approximately 350 mL of hazy gold fluid was removed. IMPRESSION: Successful ultrasound guided left thoracentesis yielding 350 mL of pleural fluid. Read by: Earley Abide, PA-C Electronically Signed   By: Jerilynn Mages.  Shick M.D.   On: 10/29/2018 15:58   Ir Thoracentesis Asp Pleural Space W/img Guide  Result Date: 10/28/2018 INDICATION: Patient with history of metastatic disease (including bone metastases) on imaging, dyspnea, and bilateral pleural effusions (R>L). Request is made for diagnostic and therapeutic right thoracentesis. EXAM:  ULTRASOUND GUIDED DIAGNOSTIC AND THERAPEUTIC RIGHT THORACENTESIS MEDICATIONS: 10 mL 1% lidocaine COMPLICATIONS: None immediate. PROCEDURE: An ultrasound guided thoracentesis was thoroughly discussed with the patient and questions answered. The benefits, risks, alternatives and complications were also discussed. The patient understands and wishes to proceed with the procedure. Written consent was obtained. Ultrasound was performed to localize and mark an adequate pocket of fluid in the right chest. The area was then prepped and draped in the normal sterile fashion. 1% Lidocaine was used for local anesthesia. Under ultrasound guidance a 6 Fr Safe-T-Centesis catheter was introduced. Thoracentesis was performed. The catheter was removed and a dressing applied. FINDINGS: A total of approximately 600 mL of clear gold fluid was removed. Samples were sent to the laboratory as requested by the clinical team. IMPRESSION: Successful ultrasound guided right thoracentesis yielding 600 mL of pleural fluid. Read by: Earley Abide, PA-C Electronically Signed   By: Jerilynn Mages.  Shick M.D.   On: 10/28/2018 16:10   US Thoracentesis Asp Pleural Space W/img Guide  Result Date: 11/15/2018 INDICATION: Patient with history of metastatic breast cancer, dyspnea, bilateral pleural effusions L>R. Request is made for therapeutic left thoracentesis. EXAM: ULTRASOUND GUIDED THERAPEUTIC LEFT THORACENTESIS MEDICATIONS: 10 mL 1% lidocaine COMPLICATIONS: None immediate. PROCEDURE: An ultrasound guided thoracentesis was thoroughly discussed with the patient and questions answered. The benefits, risks, alternatives and complications were also discussed. The patient understands and wishes to proceed with the procedure. Written consent was obtained. Ultrasound was performed to localize and mark an adequate pocket of fluid in the left chest. The area was then prepped and draped in the normal sterile fashion. 1% Lidocaine was used for local anesthesia. Under  ultrasound guidance a 6 Fr Safe-T-Centesis catheter was introduced. Thoracentesis was performed. The catheter was removed and a dressing applied. FINDINGS: A total of approximately 400 mL of dark red fluid was removed. IMPRESSION: Successful ultrasound guided left thoracentesis yielding 400 mL of pleural fluid. Read by: Earley Abide, PA-C Electronically Signed   By: Markus Daft M.D.   On: 11/15/2018 14:32    ASSESSMENT: 58 y.o. [place], [place] woman presenting 10/26/2018 with right-sided inflammatory breast cancer, stage IV, involving lungs, lymph nodes and bones, as follows:             (a) chest CT scan 10/26/2018 shows bilateral pleural effusions, possible lymphangitic spread of tumor, diffuse bony metastatic disease, and significant axillary mediastinal and hilar adenopathy             (  b) bone scan 10/27/2018 is a "near SuperScan" consistent with widespread bony metastatic disease             (c) head CT with and without contrast 10/30/2018 shows no intracranial metastatic disease, multiple calvarial lesions             (d) CA-27-29 on 10/27/2018 was 1810.4  (1) pleural fluid from right thoracentesis 10/28/2018 confirms malignant cells consistent with a breast primary, strongly estrogen and progesterone receptor positive, HER-2 not amplified, with an MIB-1 of 2%  (2) anastrozole started 10/29/2018             (a) palbociclib to started 11/24/2018             (b) denosumab/xgeva started 11/13/2018  (3) associated problems:             (a) hypoxia secondary to effusions             (b) pain from bone lesions             (c) right upper extremity lymphedema             (d) anemia, requiring transfusion             (e) poor venous access  (4) genetics testing pending  (5) hypocalcemia  (6) hypokalemia  (7) Anemia  (8) thrombocytopenia  (9) goals of care  PLAN:  Garnett appears improved.  She remains on oxygen but her breathing is better.  He is tolerating her  anastrozole and Ibrance well.  Recommend that she continue these medications.  Anticipate that she will continue to slowly improve in terms of her pulmonary symptoms over the next 2 months.  She is aware that she needs to try to get up and ambulate to get stronger and ready for discharge.  It appears as though she will be going to a skilled nursing facility upon discharge but the patient and her daughter are talking with her landlord about getting a ramp to help her in and out of her home.  Her platelet count has now normalized to 150,000.  She remains anemic with a hemoglobin 7.5.  She is not actively bleeding.  Recommend packed red blood cell transfusion if her hemoglobin less than 7.0 or active bleeding.  Will follow with you   LOS: 13 days   Mikey Bussing, DNP, AGPCNP-BC, AOCNP 11/27/18    ADDENDUM: Alison is slowly improving and at some point she will leave the hospital, either to an SNF or if she improves sufficiently over the next days/week to her home.   The plan from a breast cancer point of view is to continue letrozole daily, palbociclib daily 21 days on and 7 off, and denosumab/Xgeva every 28 days. She will be completely restaged in October. She has an appointment at the cancer center 12/11/2018  Greatly appreciate your help to [place]! We will continue to follow with you.  Chauncey Cruel, MD Medical Oncology and Hematology [facility] [address] [place], [place] [address] Tel. [phone]    Fax. [phone]

## 2018-11-27 NOTE — Progress Notes (Signed)
[place] CSW Progress Notes  Spoke w Velora Heckler, [facility] [employer] Adv[employer] assisting w filing patient's application for Medicaid and referring to [facility] for disability application.  Is submitting application for Medicaid today to DSS worker Hoy Finlay ([phone]) and making [place] referral.  [name], LCSW Clinical Social Worker Phone:  [phone]

## 2018-11-27 NOTE — TOC Progression Note (Signed)
Transition of Care [facility]) - Progression Note    Patient Details  Name: Suzanne [name] MRN: [id number] Date of Birth: 1961-03-04  [facility] CM/SW Contact  Jaykob Minichiello, Juliann Pulse, RN Phone Number: 11/27/2018, 4:30 PM  Clinical Narrative:  Per Dr. Jana Hakim nurse [facility] has a zero co pay through [place] through 11/15/19-it is mail order to dtr [place]. Tammy with [facility] will eval & see if bale to accept under [facility]-PT to be paid through [facility].     Expected Discharge Plan: Skilled Nursing Facility Barriers to Discharge: Continued Medical Work up  Expected Discharge Plan and Services Expected Discharge Plan: [place]   Discharge Planning Services: CM Consult   Living arrangements for the past 2 months: Single Family Home                                       Social Determinants of Health (SDOH) Interventions    Readmission Risk Interventions Readmission Risk Prevention Plan 11/17/2018  Transportation Screening Complete  PCP or Specialist Appt within 3-5 Days Not Complete  Not Complete comments will need pcp set up  [place] or [place] Complete  Social Work Consult for [place] Planning/Counseling Complete  Palliative Care Screening Not Complete  Palliative Care Screening Not Complete Comments pending- bedside RN states plan to consult palliative this admission  Medication Review (RN Care Manager) Complete  Some recent data might be hidden

## 2018-11-27 NOTE — Progress Notes (Signed)
Dr. Tana Coast updated via phone. MD has ordered 1 u PRBCs to be transfused this afternoon. RN educated Pt on the order for transfusion of 1 u PRBCs. Pt voiced concerns regarding the transfusion. Pt states "Does my Oncologist know about this? I had a problem with my most recent transfusion and I don't want to have blood today" MD has canceled blood trabsusion at this time

## 2018-11-28 LAB — CBC
HCT: 22.3 % — ABNORMAL LOW (ref 36.0–46.0)
Hemoglobin: 6.9 g/dL — CL (ref 12.0–15.0)
MCH: 32.1 pg (ref 26.0–34.0)
MCHC: 30.9 g/dL (ref 30.0–36.0)
MCV: 103.7 fL — ABNORMAL HIGH (ref 80.0–100.0)
Platelets: 187 10*3/uL (ref 150–400)
RBC: 2.15 MIL/uL — ABNORMAL LOW (ref 3.87–5.11)
RDW: 16.7 % — ABNORMAL HIGH (ref 11.5–15.5)
WBC: 8.4 10*3/uL (ref 4.0–10.5)
nRBC: 0.5 % — ABNORMAL HIGH (ref 0.0–0.2)

## 2018-11-28 LAB — BASIC METABOLIC PANEL
Anion gap: 9 (ref 5–15)
BUN: 18 mg/dL (ref 6–20)
CO2: 30 mmol/L (ref 22–32)
Calcium: 6.8 mg/dL — ABNORMAL LOW (ref 8.9–10.3)
Chloride: 95 mmol/L — ABNORMAL LOW (ref 98–111)
Creatinine, Ser: 0.9 mg/dL (ref 0.44–1.00)
GFR calc Af Amer: >60 mL/min (ref >60)
GFR calc non Af Amer: >60 mL/min (ref >60)
Glucose, Bld: 133 mg/dL — ABNORMAL HIGH (ref 70–99)
Potassium: 3.9 mmol/L (ref 3.5–5.1)
Sodium: 134 mmol/L — ABNORMAL LOW (ref 135–145)

## 2018-11-28 LAB — NOVEL CORONAVIRUS, NAA (HOSP ORDER, SEND-OUT TO REF LAB; TAT 18-24 HRS): SARS-CoV-2, NAA: NOT DETECTED

## 2018-11-28 LAB — ABO/RH: ABO/RH(D): O POS

## 2018-11-28 LAB — PREPARE RBC (CROSSMATCH)

## 2018-11-28 MED ORDER — LEVALBUTEROL HCL 0.63 MG/3ML IN NEBU: 0.6300 mg | INHALATION_SOLUTION | Freq: Four times a day (QID) | RESPIRATORY_TRACT | 12 refills | Status: DC | PRN

## 2018-11-28 MED ORDER — SODIUM CHLORIDE 0.9% IV SOLUTION
Freq: Once | INTRAVENOUS | Status: AC
  Administered 2018-11-28: 10:00:00 via INTRAVENOUS

## 2018-11-28 MED ORDER — BENZONATATE 200 MG PO CAPS: 200.0000 mg | ORAL_CAPSULE | Freq: Three times a day (TID) | ORAL | 0 refills | Status: DC | PRN

## 2018-11-28 MED ORDER — FUROSEMIDE 10 MG/ML IJ SOLN
20.0000 mg | Freq: Once | INTRAMUSCULAR | Status: AC
  Administered 2018-11-28: 15:00:00 20 mg via INTRAVENOUS
  Filled 2018-11-28: qty 2

## 2018-11-28 MED ORDER — ENSURE ENLIVE PO LIQD: 237.0000 mL | Freq: Three times a day (TID) | ORAL | 12 refills | Status: DC

## 2018-11-28 MED ORDER — LORATADINE 10 MG PO TABS: 10.0000 mg | ORAL_TABLET | Freq: Every day | ORAL | 3 refills | Status: DC

## 2018-11-28 MED ORDER — CARVEDILOL 6.25 MG PO TABS: 6.2500 mg | ORAL_TABLET | Freq: Two times a day (BID) | ORAL | 3 refills | Status: DC

## 2018-11-28 MED ORDER — CARVEDILOL 6.25 MG PO TABS: 6.2500 mg | ORAL_TABLET | Freq: Two times a day (BID) | ORAL | Status: DC

## 2018-11-28 MED ORDER — NYSTATIN 100000 UNIT/GM EX POWD
Freq: Three times a day (TID) | CUTANEOUS | Status: DC
  Administered 2018-11-28 – 2018-12-01 (×9): via TOPICAL
  Filled 2018-11-28: qty 15

## 2018-11-28 MED ORDER — NYSTATIN 100000 UNIT/GM EX POWD: Freq: Three times a day (TID) | CUTANEOUS | 1 refills | Status: DC

## 2018-11-28 MED ORDER — HYDROCODONE-HOMATROPINE 5-1.5 MG/5ML PO SYRP: 5.0000 mL | ORAL_SOLUTION | Freq: Four times a day (QID) | ORAL | 0 refills | Status: DC | PRN

## 2018-11-28 MED ORDER — FUROSEMIDE 20 MG PO TABS: 40.0000 mg | ORAL_TABLET | Freq: Two times a day (BID) | ORAL | 1 refills | Status: DC

## 2018-11-28 MED ORDER — CALCIUM CARBONATE-VITAMIN D 500-200 MG-UNIT PO TABS: 1.0000 | ORAL_TABLET | Freq: Three times a day (TID) | ORAL | 3 refills | Status: DC

## 2018-11-28 MED ORDER — CALCIUM CARBONATE-VITAMIN D 500-200 MG-UNIT PO TABS: 1.0000 | ORAL_TABLET | Freq: Three times a day (TID) | ORAL | Status: DC

## 2018-11-28 MED ORDER — LORATADINE 10 MG PO TABS: 10.0000 mg | ORAL_TABLET | Freq: Every day | ORAL | Status: DC

## 2018-11-28 MED ORDER — FUROSEMIDE 20 MG PO TABS: 40.0000 mg | ORAL_TABLET | Freq: Two times a day (BID) | ORAL | 4 refills | Status: DC

## 2018-11-28 MED ORDER — OXYCODONE HCL 5 MG PO TABS: 2.5000 mg | ORAL_TABLET | ORAL | 0 refills | Status: DC | PRN

## 2018-11-28 MED ORDER — ALBUTEROL SULFATE HFA 108 (90 BASE) MCG/ACT IN AERS: 2.0000 | INHALATION_SPRAY | Freq: Four times a day (QID) | RESPIRATORY_TRACT | 3 refills | Status: DC | PRN

## 2018-11-28 MED ORDER — LORATADINE 10 MG PO TABS
10.0000 mg | ORAL_TABLET | Freq: Every day | ORAL | Status: DC
  Administered 2018-11-28 – 2018-12-01 (×4): 10 mg via ORAL
  Filled 2018-11-28 (×4): qty 1

## 2018-11-28 MED ORDER — DIPHENHYDRAMINE HCL 50 MG/ML IJ SOLN
25.0000 mg | Freq: Four times a day (QID) | INTRAMUSCULAR | Status: DC | PRN
  Administered 2018-11-28: 25 mg via INTRAVENOUS
  Filled 2018-11-28: qty 1

## 2018-11-28 NOTE — Progress Notes (Signed)
There are no SNF beds available. Spoke with pt's daughter [name] [phone] who states that she will be at home with pt. [name] asked that [facility] would come in to help pt with HHPT. Referral given to in house rep with [facility]. Waiting to hear from [facility].

## 2018-11-28 NOTE — Progress Notes (Signed)
[facility]                                                                              Patient Demographics  Suzanne [name], is a 58 y.o. 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  Patient admitted underwent thoracentesis of the right and left lungs on 6/2 and 6/3 and scheduled for repeat 3 days later. Patient was admitted respiratory status worsened and patient placed on the BiPAP and transferred to the stepdown unit.  Patient also placed on IV diuretics.  Patient noted to spike a fever and as such also placed on IV antibiotics for presumed aspiration pneumonia.  Due to tenuous respiratory status critical care consulted. Patient underwent left-sided thoracentesis on 11/15/2018 with 400 cc of back red pleural fluid removed.  Cytology from thoracentesis of 10/28/2018+ for malignant cells consistent with breast cancer.  COVID-19 negative.  IV antibiotics narrowed down to oral Augmentin starting 11/19/2018.  Lower extremity Dopplers which were done were negative.   Palliative care consulted for goals of care.  Oncology following as well.   Assessment & Plan    Principal Problem:   Acute on chronic respiratory failure with hypoxia (HCC) likely multifactorial secondary to malignant pleural  effusions, metastatic stage IV breast CA, possible aspiration pneumonia, volume overload -Status post thoracentesis on 6/28 on the left with 400 cc of dark red pleural fluid removed -CT chest on 6/22 had shown widespread metastatic disease to the bones, multiple bilateral rib fractures, medial aspect of right clavicle fracture, probable adjacent intramuscular hemorrhage on the right lower sternocleidomastoid muscle and upper right pectoralis major muscle, probable tumor infiltration throughout the right breast as well as bilateral axillary and subpectoral lymphadenopathy.  Possibility of lymphangitic spread of the tumor in the lungs. -Post thoracentesis, patient had increased O2 requirements, and needed BiPAP and diuresis.  -Currently improving, O2 sats 100% on 2 L O2 via nasal cannula -Patient was placed on IV Lasix with good diuresis, negative balance of 17.7 L.  -Continue Lasix 40 mg twice daily, extra dose of Lasix 20 mg IV x1 after blood transfusion.   -COVID-19 test negative, repeat COVID-19 test negative  Active Problems: Hypocalcemia -Likely secondary to recent Xgeva injection on 6/21. -Continue oral calcium replacement to 1 tab 4 times daily  Malignant pleural effusion/loculated pleural effusion in the setting of right-sided inflammatory stage IV breast CA, mets involving lungs, lymph nodes, bones -Status post thoracentesis, cytology  positive for malignant cells consistent with breast cancer. -Patient may require Pleurx catheter if recurrence of effusion however not at this time per CCM. -Oncology, palliative care following, continue arimidex -Oncology started patient on palbociclib on 6/30.  Probable aspiration pneumonia -Patient was initially placed on IV antibiotics empirically, transitioned to oral Augmentin, patient has completed course of antibiotics  Microcytic anemia -Patient refused blood transfusion yesterday -Hemoglobin 6.9 today, discussed with the patient regarding  packed RBC transfusion.  She agrees today.  Transfuse 1 unit of packed RBCs, has finished at the time of this documentation, no reactions, tolerated well.  Volume overload, suspected acute diastolic CHF in the setting of blood transfusion - 2D echo 6/1 had shown EF of 60 to 36% with diastolic parameters indeterminate. -Continue Lasix 40 mg twice daily, 20 mg IV x1 after blood transfusion  Sinus tachycardia Resolved  Diarrhea Resolved  Code Status: Full code DVT Prophylaxis:  Lovenox  Family Communication: Discussed in detail with the patient, all imaging results, lab results explained to the patient and daughter on the phone   Disposition Plan:  COVID-19 test negative. Daughter concerned about steps getting into the house.   Time Spent in minutes 25 minutes  Procedures:   6/02 Right Pleural Fluid Cytology >>positive for malignant cells c/w breast cancer  6/20 Left Thoracentesis >>400 ml dark red pleural fluid removed  11/17/2018 lower extremity Dopplers CT chest 11/17/2018   Consultants:     PCCM: Dr. Carson Myrtle   Palliative care: Dr. Rowe Pavy  Allergy: Dr. Jana Hakim   Antimicrobials:   Anti-infectives (From admission, onward)   Start     Dose/Rate Route Frequency Ordered Stop   11/19/18 2200  amoxicillin-clavulanate (AUGMENTIN) 875-125 MG per tablet 1 tablet     1 tablet Oral Every 12 hours 11/19/18 0833 11/22/18 1008   11/17/18 0600  piperacillin-tazobactam (ZOSYN) IVPB 3.375 g     3.375 g 12.5 mL/hr over 240 Minutes Intravenous Every 8 hours 11/16/18 2235 11/19/18 1833   11/17/18 0000  piperacillin-tazobactam (ZOSYN) IVPB 3.375 g  Status:  Discontinued     3.375 g 100 mL/hr over 30 Minutes Intravenous Every 6 hours 11/16/18 2231 11/16/18 2235   11/16/18 0900  ceFEPIme (MAXIPIME) 1 g in sodium chloride 0.9 % 100 mL IVPB  Status:  Discontinued     1 g 200 mL/hr over 30 Minutes Intravenous Every 8 hours 11/16/18 0815 11/16/18 2231         Medications  Scheduled  Meds:  anastrozole  1 mg Oral Daily   benzonatate  200 mg Oral TID   calcium-vitamin D  1 tablet Oral TID AC & HS   carvedilol  6.25 mg Oral BID WC   enoxaparin (LOVENOX) injection  40 mg Subcutaneous Daily   feeding supplement (ENSURE ENLIVE)  237 mL Oral TID BM   furosemide  40 mg Oral BID   loratadine  10 mg Oral Daily   mouth rinse  15 mL Mouth Rinse q12n4p   nystatin   Topical TID   palbociclib  125 mg Oral Q breakfast   senna-docusate  1 tablet Oral QHS   Continuous Infusions:  sodium chloride Stopped (11/16/18 1407)   PRN Meds:.sodium chloride, acetaminophen **OR** acetaminophen, bisacodyl, clotrimazole, diphenhydrAMINE, HYDROcodone-homatropine, levalbuterol, loperamide, LORazepam, morphine injection, ondansetron **OR** ondansetron (ZOFRAN) IV, oxyCODONE, polyethylene glycol      Subjective:   Alesandra Smart was seen and examined today.  Feels weak otherwise no acute complaints, shortness of breath is improving.  Explained about blood transfusion necessary  at this point with a hemoglobin of 6.9.  Explained all the risks and benefits.  Patient concerned about the past experience after blood transfusion when she had respiratory failure.   Patient denies dizziness, abdominal pain, N/V new weakness, numbess, tingling. .    Objective:   Vitals:   11/28/18 1009 11/28/18 1042 11/28/18 1300 11/28/18 1430  BP: 128/62 109/63 124/74 (!) 115/59  Pulse: 96 91 90 88  Resp: 18 18 18 20   Temp: 98.3 F (36.8 C) 99.1 F (37.3 C) 98 F (36.7 C) 98.4 F (36.9 C)  TempSrc: Oral Oral  Oral  SpO2: 99% 99% 98% 98%  Weight:      Height:        Intake/Output Summary (Last 24 hours) at 11/28/2018 1522 Last data filed at 11/28/2018 1430 Gross per 24 hour  Intake 746 ml  Output 801 ml  Net -55 ml     Wt Readings from Last 3 Encounters:  11/28/18 86.9 kg  10/30/18 106.2 kg  02/21/11 106.6 kg     Physical Exam  General: Alert and oriented x 3, NAD  Eyes:   HEENT:   Atraumatic, normocephalic  Cardiovascular: S1 S2 clear,RRR. No pedal edema b/l  Respiratory: Fairly CTA B  Gastrointestinal: Soft, nontender, nondistended, NBS  Ext: no pedal edema bilaterally  Neuro: no new deficits  Musculoskeletal: No cyanosis, clubbing  Skin: No rashes  Psych: Normal affect and demeanor, alert and oriented x3     Data Reviewed:  I have personally reviewed following labs and imaging studies  Micro Results Recent Results (from the past 240 hour(s))  Novel Coronavirus, NAA (hospital order; send-out to ref lab)     Status: None   Collection Time: 11/25/18  2:58 PM   Specimen: Nasopharyngeal Swab; Respiratory  Result Value Ref Range Status   SARS-CoV-2, NAA NOT DETECTED NOT DETECTED Final    Comment: (NOTE) This test was developed and its performance characteristics determined by [employer]. This test has not been FDA cleared or approved. This test has been authorized by FDA under an Emergency Use Authorization (EUA). This test is only authorized for the duration of time the declaration that circumstances exist justifying the authorization of the emergency use of in vitro diagnostic tests for detection of SARS-CoV-2 virus and/or diagnosis of COVID-19 infection under section 564(b)(1) of the Act, 21 U.S.C. 295AOZ-3(Y)(8), unless the authorization is terminated or revoked sooner. When diagnostic testing is negative, the possibility of a false negative result should be considered in the context of a patient's recent exposures and the presence of clinical signs and symptoms consistent with COVID-19. An individual without symptoms of COVID-19 and who is not shedding SARS-CoV-2 virus would expect to have a negative (not detected) result in this assay. Performed  At: [facility] [place], [place] [id number] Rush Farmer MD XB:[id number]    [place]  Final    Comment: Performed at [facility] [address] [place], [place] [address]    Radiology Reports Dg Chest 1 View  Result Date: 11/15/2018 CLINICAL DATA:  Post left-sided thoracentesis. EXAM: CHEST  1 VIEW COMPARISON:  11/14/2018 FINDINGS: Lungs are hypoinflated demonstrate interval improvement with moderate residual left-sided pleural effusion likely with associated basilar atelectasis. No pneumothorax. Mild prominence of the perihilar markings without significant change. Cardiomediastinal silhouette and remainder of the exam is unchanged. IMPRESSION: Hypoinflation with interval improvement in left pleural effusion post thoracentesis. No pneumothorax. Moderate residual left effusion likely with associated basilar  atelectasis. Prominence of the perihilar markings suggesting vascular congestion. Electronically Signed   By: Marin Olp M.D.   On: 11/15/2018 13:58   Dg Chest 1 View  Result Date: 10/29/2018 CLINICAL DATA:  Post LEFT thoracentesis EXAM: CHEST  1 VIEW COMPARISON:  622 in 20 FINDINGS: Upper normal heart size. Stable mediastinal contours. BILATERAL upper lobe scarring. Improved aeration at bases since prior study. Minimal residual LEFT pleural effusion without pneumothorax. Bones demineralized. IMPRESSION: Extensive chronic lung changes. No pneumothorax following LEFT thoracentesis. Electronically Signed   By: Lavonia Dana M.D.   On: 10/29/2018 15:50   Dg Chest 2 View  Result Date: 11/14/2018 CLINICAL DATA:  Stage IV breast cancer.  Shortness of breath. EXAM: CHEST - 2 VIEW COMPARISON:  Radiographs 11/13/2018 and 10/29/2018.  CT 10/26/2018. FINDINGS: The heart size and mediastinal contours are stable. Left pleural effusion and left basilar opacity are unchanged from the recent study. Diffusely increased interstitial markings are again noted throughout both lungs, remaining suspicious for lymphangitic spread of tumor based on previous CT. No progressive airspace opacities identified. Diffuse osseous  metastatic disease noted without evidence of acute fracture. IMPRESSION: Stable radiographic appearance of the chest since yesterday. A left pleural effusion and left basilar pulmonary opacity have increased from older prior studies. Evidence of extensive metastatic disease again noted. Electronically Signed   By: Richardean Sale M.D.   On: 11/14/2018 17:10   Dg Chest 2 View  Result Date: 11/13/2018 CLINICAL DATA:  Shortness of breath, weakness. Cancer patient. EXAM: CHEST - 2 VIEW COMPARISON:  Chest x-ray dated 10/29/2018. Chest CT dated 10/26/2018. FINDINGS: Heart size and mediastinal contours appear stable. Increased opacity at the LEFT lung base, likely increased pleural effusion. There is also central pulmonary vascular congestion and bilateral perihilar edema. No pneumothorax seen. Diffuse osseous metastases, as described on previous CT report. IMPRESSION: 1. Increased opacity at the LEFT lung base, likely increased pleural effusion. Superimposed pneumonia or airspace collapse not excluded. 2. Central pulmonary vascular congestion and bilateral perihilar edema indicating CHF/volume overload. 3. Diffuse osseous metastases. Electronically Signed   By: Franki Cabot M.D.   On: 11/13/2018 12:32   Ct Head W & Wo Contrast  Result Date: 10/30/2018 CLINICAL DATA:  Metastatic breast cancer. No neurologic symptoms are reported. EXAM: CT HEAD WITHOUT AND WITH CONTRAST TECHNIQUE: Contiguous axial images were obtained from the base of the skull through the vertex without and with intravenous contrast CONTRAST:  3mL ISOVUE-300 IOPAMIDOL (ISOVUE-300) INJECTION 61% COMPARISON:  Nuclear medicine whole-body bone scan, 10/27/2018 FINDINGS: Brain: No acute stroke, hemorrhage, mass lesion or hydrocephalus. Mild atrophy. No extra-axial fluid. Post infusion, no abnormal enhancement of the brain or visible meninges. Vascular: No hyperdense vessel or unexpected calcification. Visible vessels are patent. Skull: Widespread  metastatic disease to the calvarium, skull base, and visible cervical spine. Metastatic disease to the mandible is also present. There is destruction of the inner and outer table of the skull in multiple locations. Epidural tumor is not established. Sinuses/Orbits: No acute finding. Other: None. IMPRESSION: 1. Widespread osseous metastatic disease to the calvarium, skull base, and visible cervical spine. Epidural tumor not established. 2. No intracranial metastatic disease is evident. Electronically Signed   By: Staci Righter M.D.   On: 10/30/2018 09:50   Ct Chest Wo Contrast  Result Date: 11/17/2018 CLINICAL DATA:  58 year old female Caucasian female nonsmoker with history of bilateral lower extremity edema. Possible stage IV breast cancer. EXAM: CT CHEST WITHOUT CONTRAST TECHNIQUE: Multidetector CT imaging of the chest was  performed following the standard protocol without IV contrast. COMPARISON:  Chest CT 10/26/2018. FINDINGS: Cardiovascular: Heart size is normal. There is no significant pericardial fluid, thickening or pericardial calcification. There is aortic atherosclerosis, as well as atherosclerosis of the great vessels of the mediastinum and the coronary arteries, including calcified atherosclerotic plaque in the left anterior descending and right coronary arteries. Mediastinum/Nodes: No pathologically enlarged mediastinal or hilar lymph nodes. Please note that accurate exclusion of hilar adenopathy is limited on noncontrast CT scans. Esophagus is unremarkable in appearance. Multiple borderline enlarged and mildly enlarged bilateral axillary and subpectoral lymph nodes (right greater than left) measuring up to 15 mm in short axis on the right (axial image 49 of series 2). Lungs/Pleura: Extensive septal thickening with band like opacities throughout both lungs, similar to the prior study. No discrete pulmonary nodules or masses are confidently identified. Moderate left and small right pleural effusions.  The left pleural effusion is predominantly sub pulmonic in position. Upper Abdomen: Several peripherally calcified gallstones lying dependently in the gallbladder measuring up to 1.9 cm in diameter. Aortic atherosclerosis. Musculoskeletal: Right breast is incompletely imaged, but there appears to be irregular soft tissue infiltration throughout much of the right breast tissue, which could reflect tumoral in for tray shin and/or edema. Diffuse lytic and sclerotic lesions throughout the visualized axial and appendicular skeleton, indicative of widespread metastatic disease to the bones. With appears very similar to the prior examination. There continue to be multiple nondisplaced to minimally displaced pathologic fractures throughout the ribs bilaterally. In addition, there is a new widely displaced fracture of the medial aspect of the right clavicle with approximately 1.4 cm of lateral and anterior displacement, as well as high attenuation thickening of the adjacent musculature in the upper right pectoralis major and lower right sternocleidomastoid muscles, most compatible with intramuscular hematoma and/or tumor. IMPRESSION: 1. Widespread metastatic disease to the bones redemonstrated, with multiple nondisplaced minimally displaced bilateral rib fractures, as well as a new widely displaced fracture of the medial aspect of the right clavicle with probable adjacent intramuscular hemorrhage in the lower right sternocleidomastoid muscle and upper right pectoralis major muscle. Probable tumoral infiltration throughout the right breast, as well as bilateral axillary and subpectoral lymphadenopathy (right greater than left). 2. Unusual appearance of the lungs, similar to the prior study, which could suggest chronic systemic disease such as sarcoidosis or interstitial lung disease. The possibility of lymphangitic spread of tumor in the lungs could be considered, but is not strongly favored at this time. 3. Moderate left  (predominantly subpulmonic) pleural effusion and small right pleural effusion. 4. Cholelithiasis. 5. Aortic atherosclerosis, in addition to 2 vessel coronary artery disease. Aortic Atherosclerosis (ICD10-I70.0). Electronically Signed   By: Vinnie Langton M.D.   On: 11/17/2018 14:55   Dg Chest Port 1 View  Result Date: 11/20/2018 CLINICAL DATA:  Hypoxia, metastatic breast cancer EXAM: PORTABLE CHEST 1 VIEW COMPARISON:  Portable exam 1200 hours compared to 11/19/2018 FINDINGS: Very low lung volumes. Stable heart size mediastinal contours. BILATERAL pulmonary opacities again identified, unchanged. No new infiltrate, pleural effusion or pneumothorax. Scattered areas of osseous sclerosis consistent with osseous metastatic disease IMPRESSION: Widespread osseous metastatic disease. Persistent pulmonary opacities. Electronically Signed   By: Lavonia Dana M.D.   On: 11/20/2018 14:06   Dg Chest Port 1 View  Result Date: 11/19/2018 CLINICAL DATA:  Shortness of breath EXAM: PORTABLE CHEST 1 VIEW COMPARISON:  Yesterday FINDINGS: Very low volume chest with patchy bilateral infiltrate greater on the right. Some of these are  streaky and suggestive of scarring or atelectasis. No visible effusion or pneumothorax. Largely obscured heart which is not enlarged by recent CT. Extensive osseous metastatic disease IMPRESSION: Stable low volume chest with atelectasis and interstitial opacity, reference chest CT from 2 days ago. Electronically Signed   By: Monte Fantasia M.D.   On: 11/19/2018 08:54   Dg Chest Port 1 View  Result Date: 11/18/2018 CLINICAL DATA:  Shortness of breath and increased weakness this morning; history metastatic breast cancer EXAM: PORTABLE CHEST 1 VIEW COMPARISON:  Portable exam 0823 hours compared to 11/17/2018 FINDINGS: Very low lung volumes. Normal heart size mediastinal contours. Patchy opacities in the lungs bilaterally favoring pneumonia over atelectasis. No pleural effusion or pneumothorax. Patchy  osteosclerosis consistent with osseous metastatic disease. IMPRESSION: BILATERAL pulmonary opacities likely representing multifocal pneumonia. Electronically Signed   By: Lavonia Dana M.D.   On: 11/18/2018 09:30   Dg Chest Port 1 View  Result Date: 11/17/2018 CLINICAL DATA:  Shortness of breath.  Metastatic breast cancer. EXAM: PORTABLE CHEST 1 VIEW COMPARISON:  11/16/2018.  CT 10/26/2018. FINDINGS: Mediastinum stable. Heart size normal. Persistent left base atelectasis infiltrate. Right base atelectasis/infiltrate also noted on today's exam. Diffuse bilateral interstitial prominence again noted. Tiny left pleural effusion again noted. No pneumothorax. Multiple lytic lesions noted throughout the visualized bony structures. IMPRESSION: 1. Persistent left base atelectasis/infiltrate. Right base atelectasis/infiltrate also noted on today's exam. Small left pleural effusion again noted. 2. Diffuse bilateral interstitial prominence again noted consistent with interstitial tumor spread this patient with known metastatic disease. 3. Multiple lytic lesions are again noted throughout the visualized bony structures consistent with metastatic disease. Electronically Signed   By: Marcello Moores  Register   On: 11/17/2018 06:24   Dg Chest Port 1 View  Result Date: 11/16/2018 CLINICAL DATA:  Dyspnea, metastatic breast cancer EXAM: PORTABLE CHEST 1 VIEW COMPARISON:  Chest radiograph from one day prior. FINDINGS: Low lung volumes. Stable cardiomediastinal silhouette with mild cardiomegaly. No pneumothorax. Stable small bilateral pleural effusions. Stable patchy left lung base consolidation. Stable thick irregular reticular opacities in the upper lungs. IMPRESSION: 1. Stable low lung volumes with small bilateral pleural effusions. 2. Stable thick irregular reticular opacities in the upper lungs and patchy left lung base consolidation. Findings likely representing combination of lymphangitic tumor (as seen on recent chest CT) and  atelectasis. A component of mild pulmonary edema cannot be excluded. Electronically Signed   By: Ilona Sorrel M.D.   On: 11/16/2018 07:13   Dg Abd 2 Views  Result Date: 11/13/2018 CLINICAL DATA:  Pt c/o sob, weakness, cancer patient. EXAM: ABDOMEN - 2 VIEW COMPARISON:  None. FINDINGS: Overall visualized bowel gas pattern is nonobstructive. Moderate amount of stool and gas throughout the colon. No evidence of free intraperitoneal air seen. Diffuse osseous metastases, as previously described. IMPRESSION: 1. Nonobstructive bowel gas pattern. Moderate amount of stool and gas throughout the colon. 2. Diffuse osseous metastases. Electronically Signed   By: Franki Cabot M.D.   On: 11/13/2018 12:33   Vas [place] Lower Extremity Venous (dvt)  Result Date: 11/18/2018  Lower Venous Study Indications: Swelling.  Risk Factors: Cancer. Limitations: Body habitus and poor ultrasound/tissue interface. Comparison Study: No prior studies. Performing Technologist: Oliver Hum RVT  Examination Guidelines: A complete evaluation includes B-mode imaging, spectral Doppler, color Doppler, and power Doppler as needed of all accessible portions of each vessel. Bilateral testing is considered an integral part of a complete examination. Limited examinations for reoccurring indications may be performed as noted.  +---------+---------------+---------+-----------+----------+-------+  RIGHT     Compressibility Phasicity Spontaneity Properties Summary  +---------+---------------+---------+-----------+----------+-------+  CFV       Full            Yes       Yes                             +---------+---------------+---------+-----------+----------+-------+  SFJ       Full                                                      +---------+---------------+---------+-----------+----------+-------+  FV Prox   Full                                                      +---------+---------------+---------+-----------+----------+-------+  FV Mid    Full                                                       +---------+---------------+---------+-----------+----------+-------+  FV Distal Full                                                      +---------+---------------+---------+-----------+----------+-------+  PFV       Full                                                      +---------+---------------+---------+-----------+----------+-------+  POP       Full            Yes       Yes                             +---------+---------------+---------+-----------+----------+-------+  PTV       Full                                                      +---------+---------------+---------+-----------+----------+-------+  PERO      Full                                                      +---------+---------------+---------+-----------+----------+-------+   +---------+---------------+---------+-----------+----------+-------+  LEFT      Compressibility Phasicity Spontaneity Properties Summary  +---------+---------------+---------+-----------+----------+-------+  CFV       Full            Yes  Yes                             +---------+---------------+---------+-----------+----------+-------+  SFJ       Full                                                      +---------+---------------+---------+-----------+----------+-------+  FV Prox   Full                                                      +---------+---------------+---------+-----------+----------+-------+  FV Mid    Full                                                      +---------+---------------+---------+-----------+----------+-------+  FV Distal Full                                                      +---------+---------------+---------+-----------+----------+-------+  PFV       Full                                                      +---------+---------------+---------+-----------+----------+-------+  POP       Full            Yes       Yes                              +---------+---------------+---------+-----------+----------+-------+  PTV       Full                                                      +---------+---------------+---------+-----------+----------+-------+  PERO      Full                                                      +---------+---------------+---------+-----------+----------+-------+     Summary: Right: There is no evidence of deep vein thrombosis in the lower extremity. No cystic structure found in the popliteal fossa. Left: There is no evidence of deep vein thrombosis in the lower extremity. No cystic structure found in the popliteal fossa.  *See table(s) above for measurements and observations. Electronically signed by Deitra Mayo MD on 11/18/2018 at 11:42:50 AM.    Final    Ir Thoracentesis Asp Pleural Space W/img Guide  Result  Date: 10/29/2018 INDICATION: Patient with history of metastatic disease on imaging, dyspnea, and bilateral pleural effusions. Request is made for therapeutic left thoracentesis. EXAM: ULTRASOUND GUIDED THERAPEUTIC LEFT THORACENTESIS MEDICATIONS: 10 mL 1% lidocaine COMPLICATIONS: None immediate. PROCEDURE: An ultrasound guided thoracentesis was thoroughly discussed with the patient and questions answered. The benefits, risks, alternatives and complications were also discussed. The patient understands and wishes to proceed with the procedure. Written consent was obtained. Ultrasound was performed to localize and mark an adequate pocket of fluid in the left chest. The area was then prepped and draped in the normal sterile fashion. 1% Lidocaine was used for local anesthesia. Under ultrasound guidance a 6 Fr Safe-T-Centesis catheter was introduced. Thoracentesis was performed. The catheter was removed and a dressing applied. FINDINGS: A total of approximately 350 mL of hazy gold fluid was removed. IMPRESSION: Successful ultrasound guided left thoracentesis yielding 350 mL of pleural fluid. Read by: Earley Abide, PA-C  Electronically Signed   By: Jerilynn Mages.  Shick M.D.   On: 10/29/2018 15:58   US Thoracentesis Asp Pleural Space W/img Guide  Result Date: 11/15/2018 INDICATION: Patient with history of metastatic breast cancer, dyspnea, bilateral pleural effusions L>R. Request is made for therapeutic left thoracentesis. EXAM: ULTRASOUND GUIDED THERAPEUTIC LEFT THORACENTESIS MEDICATIONS: 10 mL 1% lidocaine COMPLICATIONS: None immediate. PROCEDURE: An ultrasound guided thoracentesis was thoroughly discussed with the patient and questions answered. The benefits, risks, alternatives and complications were also discussed. The patient understands and wishes to proceed with the procedure. Written consent was obtained. Ultrasound was performed to localize and mark an adequate pocket of fluid in the left chest. The area was then prepped and draped in the normal sterile fashion. 1% Lidocaine was used for local anesthesia. Under ultrasound guidance a 6 Fr Safe-T-Centesis catheter was introduced. Thoracentesis was performed. The catheter was removed and a dressing applied. FINDINGS: A total of approximately 400 mL of dark red fluid was removed. IMPRESSION: Successful ultrasound guided left thoracentesis yielding 400 mL of pleural fluid. Read by: Earley Abide, PA-C Electronically Signed   By: Markus Daft M.D.   On: 11/15/2018 14:32    Lab Data:  CBC: Recent Labs  Lab 11/22/18 0224 11/23/18 0258 11/24/18 0247 11/25/18 0517 11/26/18 0514 11/27/18 0537 11/28/18 0459  WBC 9.6 10.5 10.4 12.1* 10.3 8.6 8.4  NEUTROABS 5.1 5.7 5.9  --  6.0  --   --   HGB 8.5* 8.6* 8.2* 8.2* 7.8* 7.5* 6.9*  HCT 28.3* 29.2* 27.4* 26.4* 25.7* 24.9* 22.3*  MCV 104.4* 105.8* 104.6* 102.3* 103.6* 104.2* 103.7*  PLT 103* 112* 110* 120* 129* 150 086   Basic Metabolic Panel: Recent Labs  Lab 11/22/18 0224  11/24/18 0247 11/25/18 0517 11/26/18 0514 11/27/18 0537 11/28/18 0459  NA 138   < > 134* 134* 133* 134* 134*  K 3.9   < > 3.9 3.8 4.2 3.9 3.9  CL  94*   < > 92* 92* 92* 94* 95*  CO2 32   < > 29 29 30 30 30   GLUCOSE 131*   < > 133* 139* 132* 138* 133*  BUN 16   < > 21* 22* 23* 21* 18  CREATININE 0.66   < > 0.75 0.80 0.90 0.90 0.90  CALCIUM 6.3*   < > 6.7* 6.9* 6.6* 6.4* 6.8*  MG 2.0  --   --  2.1 2.1  --   --    < > = values in this interval not displayed.   GFR: Estimated Creatinine Clearance: 68.9 mL/min (by  C-G formula based on SCr of 0.9 mg/dL). Liver Function Tests: Recent Labs  Lab 11/25/18 0517 11/27/18 0537  AST 37 30  ALT 31 30  ALKPHOS 190* 201*  BILITOT 1.0 0.6  PROT 7.1 7.0  ALBUMIN 2.5* 2.5*   No results for input(s): LIPASE, AMYLASE in the last 168 hours. No results for input(s): AMMONIA in the last 168 hours. Coagulation Profile: No results for input(s): INR, PROTIME in the last 168 hours. Cardiac Enzymes: No results for input(s): CKTOTAL, CKMB, CKMBINDEX, TROPONINI in the last 168 hours. BNP (last 3 results) No results for input(s): PROBNP in the last 8760 hours. HbA1C: No results for input(s): HGBA1C in the last 72 hours. CBG: Recent Labs  Lab 11/22/18 1639  GLUCAP 161*   Lipid Profile: No results for input(s): CHOL, HDL, LDLCALC, TRIG, CHOLHDL, LDLDIRECT in the last 72 hours. Thyroid Function Tests: No results for input(s): TSH, T4TOTAL, FREET4, T3FREE, THYROIDAB in the last 72 hours. Anemia Panel: No results for input(s): VITAMINB12, FOLATE, FERRITIN, TIBC, IRON, RETICCTPCT in the last 72 hours. Urine analysis:    Component Value Date/Time   COLORURINE YELLOW 10/27/2018 2346   APPEARANCEUR HAZY (A) 10/27/2018 2346   LABSPEC 1.038 (H) 10/27/2018 2346   PHURINE 5.0 10/27/2018 2346   GLUCOSEU NEGATIVE 10/27/2018 2346   HGBUR NEGATIVE 10/27/2018 2346   BILIRUBINUR NEGATIVE 10/27/2018 2346   KETONESUR 5 (A) 10/27/2018 2346   PROTEINUR NEGATIVE 10/27/2018 2346   NITRITE NEGATIVE 10/27/2018 2346   LEUKOCYTESUR NEGATIVE 10/27/2018 2346     Albion Weatherholtz M.D. [facility] 11/28/2018,  3:22 PM  Pager: [phone] Between 7am to 7pm - call Pager - [phone]  After 7pm go to [email or web address] - password [email or web address]  Call night coverage person covering after 7pm

## 2018-11-28 NOTE — Progress Notes (Signed)
SATURATION QUALIFICATIONS: (This note is used to comply with regulatory documentation for home oxygen)  Patient Saturations on Room Air at Rest = 94  Patient Saturations on Room Air while Ambulating = 88  Patient Saturations on 0 Liters of oxygen while Ambulating = 91%  Please briefly explain why patient needs home oxygen: Patient did OK ambulating without oxygen and did drop to 88% and bounced back to 91% without O2. Will continue to monitor

## 2018-11-28 NOTE — Progress Notes (Signed)
Referral was given to [facility] who declined pt. Pt is getting blood transfusion now, PT to re-eval after blood. [facility], will re-eval. [employer] called for home O2 and hospital bed.

## 2018-11-28 NOTE — Progress Notes (Signed)
[facility] declined to take patient at present time. Asked if [facility] wiould re-look at patient after PT follow up. Pt agreed with blood transfusion.

## 2018-11-28 NOTE — Progress Notes (Signed)
Paged night coverage regarding hemoglobin 6.9. Patient stated she does not want to receive a transfusion without talking to her oncologist first.

## 2018-11-28 NOTE — Progress Notes (Addendum)
Physical Therapy Treatment Patient Details Name: Suzanne [name] MRN: [id number] DOB: Mar 09, 1961 Today's Date: 11/28/2018    History of Present Illness 58 y.o. 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  Patient underwent blood transfusion 11/13/18 and subsequently had SOB, DOE and orthopnea assoicated with cough. Dx of recurrent L pleural effusion, new onset CHF.    PT Comments    Pt is very anxious regarding mobility, stairs. Pt was able to go up/down stairs with min/guard to supervision,  cues to control anxiety and for safe technique. Pt reported feeling more confident after instruction/ practice. Pt should be able to return home with dtr and HHPT.  Encouraged pt to continue getting up to [facility], to chair for meals etc.  Pt O2 sats 94% 88% to 91% on RA. RN placed amb O2 sats note. Encouraged trunk extension and pursed lip breathing.   Follow Up Recommendations  Home health PT;Supervision for mobility/OOB;Supervision - Intermittent     Equipment Recommendations  None recommended by PT    Recommendations for Other Services       Precautions / Restrictions Precautions Precautions: Fall Type of Shoulder Precautions: Previous possible right clavicle fx. Per MD noted stabilize RUE and refrain from ROM. Possible left side rib fracture Precaution Booklet Issued: No Precaution Comments: L shoulder "bad" per pt at baseline, RUE not painful, pt does not recall precautions Restrictions Weight Bearing Restrictions: No    Mobility  Bed Mobility Overal bed mobility: Modified Independent       Supine to sit: Modified independent (Device/Increase time);HOB elevated        Transfers Overall transfer level: Needs assistance Equipment used: Rolling walker (2 wheeled) Transfers: Sit to/from [facility] Sit to  Stand: Supervision;Min guard Stand pivot transfers: Supervision;Min guard       General transfer comment: supervision to Min guard for safety, increased anterior translation of trunk to come to standing.  Ambulation/Gait Ambulation/Gait assistance: Min Gaffer (Feet): 20 Feet Assistive device: Rolling walker (2 wheeled) Gait Pattern/deviations: Decreased stride length;Step-through pattern;Trunk flexed     General Gait Details: min/guard to supervision for safety, cues for Rw safety   Stairs Stairs: Yes Stairs assistance: Min guard Stair Management: One rail Right;Step to pattern;Forwards Number of Stairs: 3 General stair comments: cues for step to pattern, useof R hand rail. pt really not requiring physical assist but much verbal encouragemeny   Wheelchair Mobility    Modified Rankin (Stroke Patients Only)       Balance   Sitting-balance support: Feet supported;Bilateral upper extremity supported Sitting balance-Leahy Scale: Good     Standing balance support: During functional activity;Single extremity supported;Bilateral upper extremity supported Standing balance-Leahy Scale: Fair(able to wt shift, perform own peri-care with unilateral UE )                              Cognition Arousal/Alertness: Awake/alert Behavior During Therapy: Anxious(labile) Overall Cognitive Status: Within Functional Limits for tasks assessed                         Following Commands: Follows one step commands consistently;Follows multi-step commands with increased time              Exercises      General Comments  Pertinent Vitals/Pain Pain Assessment: Faces Faces Pain Scale: Hurts a little bit Pain Location: LUE Pain Descriptors / Indicators: Aching;Sore Pain Intervention(s): Limited activity within patient's tolerance;Monitored during session    Home Living                      Prior Function             PT Goals (current goals can now be found in the care plan section) Acute Rehab PT Goals Patient Stated Goal: to go home PT Goal Formulation: With patient Time For Goal Achievement: 12/02/18 Potential to Achieve Goals: Good Progress towards PT goals: Progressing toward goals    Frequency    Min 3X/week      PT Plan Current plan remains appropriate    Co-evaluation              AM-PAC PT "6 Clicks" Mobility   Outcome Measure  Help needed turning from your back to your side while in a flat bed without using bedrails?: A Little Help needed moving from lying on your back to sitting on the side of a flat bed without using bedrails?: A Little Help needed moving to and from a bed to a chair (including a wheelchair)?: A Little Help needed standing up from a chair using your arms (e.g., wheelchair or bedside chair)?: A Little Help needed to walk in hospital room?: A Little Help needed climbing 3-5 steps with a railing? : A Little 6 Click Score: 18    End of Session Equipment Utilized During Treatment: Gait belt Activity Tolerance: Patient tolerated treatment well;Patient limited by fatigue Patient left: in chair;with call bell/phone within reach;with chair alarm set Nurse Communication: Mobility status PT Visit Diagnosis: Muscle weakness (generalized) (M62.81);Difficulty in walking, not elsewhere classified (R26.2)     Time: 7026-3785 PT Time Calculation (min) (ACUTE ONLY): 24 min  Charges:  $Gait Training: 23-37 mins                     Kenyon Ana, PT  Pager: [phone] Acute Rehab Dept [facility]): [phone]   11/28/2018    [facility] 11/28/2018, 5:34 PM

## 2018-11-28 NOTE — Plan of Care (Signed)

## 2018-11-28 NOTE — Progress Notes (Cosign Needed)
    Durable Medical Equipment  (From admission, onward)         Start     Ordered   11/28/18 1448  For home use only DME Hospital bed  Once    Question Answer Comment  Length of Need 6 Months   Patient has (list medical condition): Acute on chronic respiratory failure with hypoxia ([place]) likely multifactorial secondary to malignant pleural effusions, metastatic stage IV breast CA, possible aspiration pneumonia, volume overload   The above medical condition requires: Patient requires the ability to reposition frequently   Head must be elevated greater than: 45 degrees   Bed type Semi-electric      11/28/18 1450

## 2018-11-29 LAB — CBC
HCT: 26.4 % — ABNORMAL LOW (ref 36.0–46.0)
Hemoglobin: 8.1 g/dL — ABNORMAL LOW (ref 12.0–15.0)
MCH: 30.7 pg (ref 26.0–34.0)
MCHC: 30.7 g/dL (ref 30.0–36.0)
MCV: 100 fL (ref 80.0–100.0)
Platelets: 189 10*3/uL (ref 150–400)
RBC: 2.64 MIL/uL — ABNORMAL LOW (ref 3.87–5.11)
RDW: 17.9 % — ABNORMAL HIGH (ref 11.5–15.5)
WBC: 6.8 10*3/uL (ref 4.0–10.5)
nRBC: 0.4 % — ABNORMAL HIGH (ref 0.0–0.2)

## 2018-11-29 LAB — TYPE AND SCREEN
ABO/RH(D): O POS
Antibody Screen: NEGATIVE
Unit division: 0

## 2018-11-29 LAB — HEPATIC FUNCTION PANEL
ALT: 29 U/L (ref 0–44)
AST: 24 U/L (ref 15–41)
Albumin: 2.5 g/dL — ABNORMAL LOW (ref 3.5–5.0)
Alkaline Phosphatase: 203 U/L — ABNORMAL HIGH (ref 38–126)
Bilirubin, Direct: 0.2 mg/dL (ref 0.0–0.2)
Indirect Bilirubin: 0.4 mg/dL (ref 0.3–0.9)
Total Bilirubin: 0.6 mg/dL (ref 0.3–1.2)
Total Protein: 7.1 g/dL (ref 6.5–8.1)

## 2018-11-29 LAB — BASIC METABOLIC PANEL
Anion gap: 10 (ref 5–15)
BUN: 15 mg/dL (ref 6–20)
CO2: 32 mmol/L (ref 22–32)
Calcium: 7 mg/dL — ABNORMAL LOW (ref 8.9–10.3)
Chloride: 93 mmol/L — ABNORMAL LOW (ref 98–111)
Creatinine, Ser: 0.91 mg/dL (ref 0.44–1.00)
GFR calc Af Amer: >60 mL/min (ref >60)
GFR calc non Af Amer: >60 mL/min (ref >60)
Glucose, Bld: 126 mg/dL — ABNORMAL HIGH (ref 70–99)
Potassium: 3.7 mmol/L (ref 3.5–5.1)
Sodium: 135 mmol/L (ref 135–145)

## 2018-11-29 LAB — BPAM RBC
Blood Product Expiration Date: 202007212359
ISSUE DATE / TIME: 202007031021
Unit Type and Rh: 5100

## 2018-11-29 MED ORDER — OXYCODONE HCL 5 MG PO TABS: 2.5000 mg | ORAL_TABLET | ORAL | 0 refills | Status: DC | PRN

## 2018-11-29 MED ORDER — CALCIUM CARBONATE-VITAMIN D 500-200 MG-UNIT PO TABS
1.0000 | ORAL_TABLET | Freq: Two times a day (BID) | ORAL | Status: DC
  Administered 2018-11-29 – 2018-11-30 (×2): 1 via ORAL
  Filled 2018-11-29 (×2): qty 1

## 2018-11-29 MED ORDER — CALCIUM CARBONATE-VITAMIN D 500-200 MG-UNIT PO TABS: 1.0000 | ORAL_TABLET | Freq: Three times a day (TID) | ORAL | 3 refills | Status: DC

## 2018-11-29 MED ORDER — CALCIUM CARBONATE-VITAMIN D 500-200 MG-UNIT PO TABS: 1.0000 | ORAL_TABLET | Freq: Two times a day (BID) | ORAL | 1 refills | Status: DC

## 2018-11-29 MED ORDER — CALCIUM CARBONATE-VITAMIN D 500-200 MG-UNIT PO TABS: 1.0000 | ORAL_TABLET | Freq: Three times a day (TID) | ORAL | Status: DC

## 2018-11-29 NOTE — Progress Notes (Signed)
[facility]                                                                              Patient Demographics  Suzanne [name], is a 58 y.o. 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  Patient admitted underwent thoracentesis of the right and left lungs on 6/2 and 6/3 and scheduled for repeat 3 days later. Patient was admitted respiratory status worsened and patient placed on the BiPAP and transferred to the stepdown unit.  Patient also placed on IV diuretics.  Patient noted to spike a fever and as such also placed on IV antibiotics for presumed aspiration pneumonia.  Due to tenuous respiratory status critical care consulted. Patient underwent left-sided thoracentesis on 11/15/2018 with 400 cc of back red pleural fluid removed.  Cytology from thoracentesis of 10/28/2018+ for malignant cells consistent with breast cancer.  COVID-19 negative.  IV antibiotics narrowed down to oral Augmentin starting 11/19/2018.  Lower extremity Dopplers which were done were negative.   Palliative care consulted for goals of care.  Oncology following as well.   Assessment & Plan    Principal Problem:   Acute on chronic respiratory failure with hypoxia (HCC) likely multifactorial secondary to malignant pleural  effusions, metastatic stage IV breast CA, possible aspiration pneumonia, volume overload -Status post thoracentesis on 6/28 on the left with 400 cc of dark red pleural fluid removed -CT chest on 6/22 had shown widespread metastatic disease to the bones, multiple bilateral rib fractures, medial aspect of right clavicle fracture, probable adjacent intramuscular hemorrhage on the right lower sternocleidomastoid muscle and upper right pectoralis major muscle, probable tumor infiltration throughout the right breast as well as bilateral axillary and subpectoral lymphadenopathy.  Possibility of lymphangitic spread of the tumor in the lungs. -Post thoracentesis, patient had increased O2 requirements, and needed BiPAP and diuresis.  -Currently improving, O2 sats 100% on 2 L O2 via nasal cannula -Patient was placed on IV Lasix with good diuresis, currently transitioned to oral Lasix -COVID-19 test negative, repeat COVID-19 test negative  Active Problems: Hypocalcemia -Likely secondary to recent Xgeva injection on 6/21. -Corrected calcium 8.2 today, decrease oral calcium replacement to 1 tab twice daily  Malignant pleural effusion/loculated pleural effusion in the setting of right-sided inflammatory stage IV breast CA, mets involving lungs, lymph nodes, bones -Status post thoracentesis, cytology positive for malignant cells consistent with breast cancer. -Patient may require Pleurx catheter if recurrence of effusion  however not at this time per CCM. -Oncology, palliative care following, continue arimidex -Oncology started patient on palbociclib on 6/30.  Probable aspiration pneumonia -Patient was initially placed on IV antibiotics empirically, transitioned to oral Augmentin, patient has completed course of antibiotics  Microcytic anemia -Patient refused blood transfusion yesterday -Hemoglobin 8.1 today, received 1 unit packed RBCs on 7/3 for hemoglobin of 6.9, tolerated well.    Volume overload,  suspected acute diastolic CHF in the setting of blood transfusion - 2D echo 6/1 had shown EF of 60 to 43% with diastolic parameters indeterminate. -Continue Lasix 40 mg twice daily, negative balance of 14 L.  Sinus tachycardia Resolved  Diarrhea Resolved  Code Status: Full code DVT Prophylaxis:  Lovenox  Family Communication: Discussed in detail with the patient, all imaging results, lab results explained to the patient   Disposition Plan:  COVID-19 test negative.   Plan was to discharge home today, however per case management, patient's hospital bed and home oxygen has not been delivered.  Since patient desats on ambulation, will be unsafe for discharge today.  Will DC home in a.m. after DME O2, hospital bed and other home health resources are secured  Time Spent in minutes 25 minutes  Procedures:   6/02 Right Pleural Fluid Cytology >>positive for malignant cells c/w breast cancer  6/20 Left Thoracentesis >>400 ml dark red pleural fluid removed  11/17/2018 lower extremity Dopplers CT chest 11/17/2018   Consultants:     PCCM: Dr. Carson Myrtle   Palliative care: Dr. Rowe Pavy  Allergy: Dr. Jana Hakim   Antimicrobials:   Anti-infectives (From admission, onward)   Start     Dose/Rate Route Frequency Ordered Stop   11/19/18 2200  amoxicillin-clavulanate (AUGMENTIN) 875-125 MG per tablet 1 tablet     1 tablet Oral Every 12 hours 11/19/18 0833 11/22/18 1008   11/17/18 0600  piperacillin-tazobactam (ZOSYN) IVPB 3.375 g     3.375 g 12.5 mL/hr over 240 Minutes Intravenous Every 8 hours 11/16/18 2235 11/19/18 1833   11/17/18 0000  piperacillin-tazobactam (ZOSYN) IVPB 3.375 g  Status:  Discontinued     3.375 g 100 mL/hr over 30 Minutes Intravenous Every 6 hours 11/16/18 2231 11/16/18 2235   11/16/18 0900  ceFEPIme (MAXIPIME) 1 g in sodium chloride 0.9 % 100 mL IVPB  Status:  Discontinued     1 g 200 mL/hr over 30 Minutes Intravenous Every 8 hours 11/16/18 0815 11/16/18 2231          Medications  Scheduled Meds:  anastrozole  1 mg Oral Daily   benzonatate  200 mg Oral TID   calcium-vitamin D  1 tablet Oral BID   carvedilol  6.25 mg Oral BID WC   enoxaparin (LOVENOX) injection  40 mg Subcutaneous Daily   feeding supplement (ENSURE ENLIVE)  237 mL Oral TID BM   furosemide  40 mg Oral BID   loratadine  10 mg Oral Daily   mouth rinse  15 mL Mouth Rinse q12n4p   nystatin   Topical TID   palbociclib  125 mg Oral Q breakfast   senna-docusate  1 tablet Oral QHS   Continuous Infusions:  sodium chloride Stopped (11/16/18 1407)   PRN Meds:.sodium chloride, acetaminophen **OR** acetaminophen, bisacodyl, clotrimazole, diphenhydrAMINE, HYDROcodone-homatropine, levalbuterol, loperamide, LORazepam, morphine injection, ondansetron **OR** ondansetron (ZOFRAN) IV, oxyCODONE, polyethylene glycol      Subjective:   Suzanne [name] was seen and examined today.  Patient wants to go home, feels better today.  Completed blood transfusion 1 unit yesterday without any  issues.  No fevers or chills, shortness of breath improving.  Patient denies dizziness, abdominal pain, N/V new weakness, numbess, tingling. .    Objective:   Vitals:   11/28/18 1824 11/28/18 1836 11/28/18 2118 11/29/18 0457  BP: 120/65  114/61 122/61  Pulse: 95  91 87  Resp:   17 18  Temp:   99.9 F (37.7 C) 98.3 F (36.8 C)  TempSrc:   Oral Oral  SpO2:   99% 100%  Weight:  86.4 kg  87.9 kg  Height:        Intake/Output Summary (Last 24 hours) at 11/29/2018 1443 Last data filed at 11/29/2018 0924 Gross per 24 hour  Intake 372.5 ml  Output 1075 ml  Net -702.5 ml     Wt Readings from Last 3 Encounters:  11/29/18 87.9 kg  10/30/18 106.2 kg  02/21/11 106.6 kg   Physical Exam  General: Alert and oriented x 3, NAD  Eyes:   HEENT:  Atraumatic, normocephalic  Cardiovascular: S1 S2 clear,  RRR. No pedal edema b/l  Respiratory: Diminished breath sound at the bases otherwise fairly  CTA B, no wheezing  Gastrointestinal: Soft, nontender, nondistended, NBS  Ext: no pedal edema bilaterally  Neuro: no new deficits  Musculoskeletal: No cyanosis, clubbing  Skin: No rashes  Psych: Normal affect and demeanor, alert and oriented x3     Data Reviewed:  I have personally reviewed following labs and imaging studies  Micro Results Recent Results (from the past 240 hour(s))  Novel Coronavirus, NAA (hospital order; send-out to ref lab)     Status: None   Collection Time: 11/25/18  2:58 PM   Specimen: Nasopharyngeal Swab; Respiratory  Result Value Ref Range Status   SARS-CoV-2, NAA NOT DETECTED NOT DETECTED Final    Comment: (NOTE) This test was developed and its performance characteristics determined by [employer]. This test has not been FDA cleared or approved. This test has been authorized by FDA under an Emergency Use Authorization (EUA). This test is only authorized for the duration of time the declaration that circumstances exist justifying the authorization of the emergency use of in vitro diagnostic tests for detection of SARS-CoV-2 virus and/or diagnosis of COVID-19 infection under section 564(b)(1) of the Act, 21 U.S.C. 962IWL-7(L)(8), unless the authorization is terminated or revoked sooner. When diagnostic testing is negative, the possibility of a false negative result should be considered in the context of a patient's recent exposures and the presence of clinical signs and symptoms consistent with COVID-19. An individual without symptoms of COVID-19 and who is not shedding SARS-CoV-2 virus would expect to have a negative (not detected) result in this assay. Performed  At: [facility] [place], [place] [id number] Rush Farmer MD YC:[id number]    [place]  Final    Comment: Performed at [facility] [address] [place], [place] [address]    Radiology Reports Dg Chest  1 View  Result Date: 11/15/2018 CLINICAL DATA:  Post left-sided thoracentesis. EXAM: CHEST  1 VIEW COMPARISON:  11/14/2018 FINDINGS: Lungs are hypoinflated demonstrate interval improvement with moderate residual left-sided pleural effusion likely with associated basilar atelectasis. No pneumothorax. Mild prominence of the perihilar markings without significant change. Cardiomediastinal silhouette and remainder of the exam is unchanged. IMPRESSION: Hypoinflation with interval improvement in left pleural effusion post thoracentesis. No pneumothorax. Moderate residual left effusion likely with associated basilar atelectasis. Prominence of the perihilar markings suggesting vascular congestion. Electronically Signed   By: Marin Olp  M.D.   On: 11/15/2018 13:58   Dg Chest 2 View  Result Date: 11/14/2018 CLINICAL DATA:  Stage IV breast cancer.  Shortness of breath. EXAM: CHEST - 2 VIEW COMPARISON:  Radiographs 11/13/2018 and 10/29/2018.  CT 10/26/2018. FINDINGS: The heart size and mediastinal contours are stable. Left pleural effusion and left basilar opacity are unchanged from the recent study. Diffusely increased interstitial markings are again noted throughout both lungs, remaining suspicious for lymphangitic spread of tumor based on previous CT. No progressive airspace opacities identified. Diffuse osseous metastatic disease noted without evidence of acute fracture. IMPRESSION: Stable radiographic appearance of the chest since yesterday. A left pleural effusion and left basilar pulmonary opacity have increased from older prior studies. Evidence of extensive metastatic disease again noted. Electronically Signed   By: Richardean Sale M.D.   On: 11/14/2018 17:10   Dg Chest 2 View  Result Date: 11/13/2018 CLINICAL DATA:  Shortness of breath, weakness. Cancer patient. EXAM: CHEST - 2 VIEW COMPARISON:  Chest x-ray dated 10/29/2018. Chest CT dated 10/26/2018. FINDINGS: Heart size and mediastinal contours appear  stable. Increased opacity at the LEFT lung base, likely increased pleural effusion. There is also central pulmonary vascular congestion and bilateral perihilar edema. No pneumothorax seen. Diffuse osseous metastases, as described on previous CT report. IMPRESSION: 1. Increased opacity at the LEFT lung base, likely increased pleural effusion. Superimposed pneumonia or airspace collapse not excluded. 2. Central pulmonary vascular congestion and bilateral perihilar edema indicating CHF/volume overload. 3. Diffuse osseous metastases. Electronically Signed   By: Franki Cabot M.D.   On: 11/13/2018 12:32   Ct Chest Wo Contrast  Result Date: 11/17/2018 CLINICAL DATA:  58 year old female Caucasian female nonsmoker with history of bilateral lower extremity edema. Possible stage IV breast cancer. EXAM: CT CHEST WITHOUT CONTRAST TECHNIQUE: Multidetector CT imaging of the chest was performed following the standard protocol without IV contrast. COMPARISON:  Chest CT 10/26/2018. FINDINGS: Cardiovascular: Heart size is normal. There is no significant pericardial fluid, thickening or pericardial calcification. There is aortic atherosclerosis, as well as atherosclerosis of the great vessels of the mediastinum and the coronary arteries, including calcified atherosclerotic plaque in the left anterior descending and right coronary arteries. Mediastinum/Nodes: No pathologically enlarged mediastinal or hilar lymph nodes. Please note that accurate exclusion of hilar adenopathy is limited on noncontrast CT scans. Esophagus is unremarkable in appearance. Multiple borderline enlarged and mildly enlarged bilateral axillary and subpectoral lymph nodes (right greater than left) measuring up to 15 mm in short axis on the right (axial image 49 of series 2). Lungs/Pleura: Extensive septal thickening with band like opacities throughout both lungs, similar to the prior study. No discrete pulmonary nodules or masses are confidently identified.  Moderate left and small right pleural effusions. The left pleural effusion is predominantly sub pulmonic in position. Upper Abdomen: Several peripherally calcified gallstones lying dependently in the gallbladder measuring up to 1.9 cm in diameter. Aortic atherosclerosis. Musculoskeletal: Right breast is incompletely imaged, but there appears to be irregular soft tissue infiltration throughout much of the right breast tissue, which could reflect tumoral in for tray shin and/or edema. Diffuse lytic and sclerotic lesions throughout the visualized axial and appendicular skeleton, indicative of widespread metastatic disease to the bones. With appears very similar to the prior examination. There continue to be multiple nondisplaced to minimally displaced pathologic fractures throughout the ribs bilaterally. In addition, there is a new widely displaced fracture of the medial aspect of the right clavicle with approximately 1.4 cm of lateral and anterior displacement,  as well as high attenuation thickening of the adjacent musculature in the upper right pectoralis major and lower right sternocleidomastoid muscles, most compatible with intramuscular hematoma and/or tumor. IMPRESSION: 1. Widespread metastatic disease to the bones redemonstrated, with multiple nondisplaced minimally displaced bilateral rib fractures, as well as a new widely displaced fracture of the medial aspect of the right clavicle with probable adjacent intramuscular hemorrhage in the lower right sternocleidomastoid muscle and upper right pectoralis major muscle. Probable tumoral infiltration throughout the right breast, as well as bilateral axillary and subpectoral lymphadenopathy (right greater than left). 2. Unusual appearance of the lungs, similar to the prior study, which could suggest chronic systemic disease such as sarcoidosis or interstitial lung disease. The possibility of lymphangitic spread of tumor in the lungs could be considered, but is not  strongly favored at this time. 3. Moderate left (predominantly subpulmonic) pleural effusion and small right pleural effusion. 4. Cholelithiasis. 5. Aortic atherosclerosis, in addition to 2 vessel coronary artery disease. Aortic Atherosclerosis (ICD10-I70.0). Electronically Signed   By: Vinnie Langton M.D.   On: 11/17/2018 14:55   Dg Chest Port 1 View  Result Date: 11/20/2018 CLINICAL DATA:  Hypoxia, metastatic breast cancer EXAM: PORTABLE CHEST 1 VIEW COMPARISON:  Portable exam 1200 hours compared to 11/19/2018 FINDINGS: Very low lung volumes. Stable heart size mediastinal contours. BILATERAL pulmonary opacities again identified, unchanged. No new infiltrate, pleural effusion or pneumothorax. Scattered areas of osseous sclerosis consistent with osseous metastatic disease IMPRESSION: Widespread osseous metastatic disease. Persistent pulmonary opacities. Electronically Signed   By: Lavonia Dana M.D.   On: 11/20/2018 14:06   Dg Chest Port 1 View  Result Date: 11/19/2018 CLINICAL DATA:  Shortness of breath EXAM: PORTABLE CHEST 1 VIEW COMPARISON:  Yesterday FINDINGS: Very low volume chest with patchy bilateral infiltrate greater on the right. Some of these are streaky and suggestive of scarring or atelectasis. No visible effusion or pneumothorax. Largely obscured heart which is not enlarged by recent CT. Extensive osseous metastatic disease IMPRESSION: Stable low volume chest with atelectasis and interstitial opacity, reference chest CT from 2 days ago. Electronically Signed   By: Monte Fantasia M.D.   On: 11/19/2018 08:54   Dg Chest Port 1 View  Result Date: 11/18/2018 CLINICAL DATA:  Shortness of breath and increased weakness this morning; history metastatic breast cancer EXAM: PORTABLE CHEST 1 VIEW COMPARISON:  Portable exam 0823 hours compared to 11/17/2018 FINDINGS: Very low lung volumes. Normal heart size mediastinal contours. Patchy opacities in the lungs bilaterally favoring pneumonia over  atelectasis. No pleural effusion or pneumothorax. Patchy osteosclerosis consistent with osseous metastatic disease. IMPRESSION: BILATERAL pulmonary opacities likely representing multifocal pneumonia. Electronically Signed   By: Lavonia Dana M.D.   On: 11/18/2018 09:30   Dg Chest Port 1 View  Result Date: 11/17/2018 CLINICAL DATA:  Shortness of breath.  Metastatic breast cancer. EXAM: PORTABLE CHEST 1 VIEW COMPARISON:  11/16/2018.  CT 10/26/2018. FINDINGS: Mediastinum stable. Heart size normal. Persistent left base atelectasis infiltrate. Right base atelectasis/infiltrate also noted on today's exam. Diffuse bilateral interstitial prominence again noted. Tiny left pleural effusion again noted. No pneumothorax. Multiple lytic lesions noted throughout the visualized bony structures. IMPRESSION: 1. Persistent left base atelectasis/infiltrate. Right base atelectasis/infiltrate also noted on today's exam. Small left pleural effusion again noted. 2. Diffuse bilateral interstitial prominence again noted consistent with interstitial tumor spread this patient with known metastatic disease. 3. Multiple lytic lesions are again noted throughout the visualized bony structures consistent with metastatic disease. Electronically Signed   By: Marcello Moores  Register   On: 11/17/2018 06:24   Dg Chest Port 1 View  Result Date: 11/16/2018 CLINICAL DATA:  Dyspnea, metastatic breast cancer EXAM: PORTABLE CHEST 1 VIEW COMPARISON:  Chest radiograph from one day prior. FINDINGS: Low lung volumes. Stable cardiomediastinal silhouette with mild cardiomegaly. No pneumothorax. Stable small bilateral pleural effusions. Stable patchy left lung base consolidation. Stable thick irregular reticular opacities in the upper lungs. IMPRESSION: 1. Stable low lung volumes with small bilateral pleural effusions. 2. Stable thick irregular reticular opacities in the upper lungs and patchy left lung base consolidation. Findings likely representing combination of  lymphangitic tumor (as seen on recent chest CT) and atelectasis. A component of mild pulmonary edema cannot be excluded. Electronically Signed   By: Ilona Sorrel M.D.   On: 11/16/2018 07:13   Dg Abd 2 Views  Result Date: 11/13/2018 CLINICAL DATA:  Pt c/o sob, weakness, cancer patient. EXAM: ABDOMEN - 2 VIEW COMPARISON:  None. FINDINGS: Overall visualized bowel gas pattern is nonobstructive. Moderate amount of stool and gas throughout the colon. No evidence of free intraperitoneal air seen. Diffuse osseous metastases, as previously described. IMPRESSION: 1. Nonobstructive bowel gas pattern. Moderate amount of stool and gas throughout the colon. 2. Diffuse osseous metastases. Electronically Signed   By: Franki Cabot M.D.   On: 11/13/2018 12:33   Vas [place] Lower Extremity Venous (dvt)  Result Date: 11/18/2018  Lower Venous Study Indications: Swelling.  Risk Factors: Cancer. Limitations: Body habitus and poor ultrasound/tissue interface. Comparison Study: No prior studies. Performing Technologist: Oliver Hum RVT  Examination Guidelines: A complete evaluation includes B-mode imaging, spectral Doppler, color Doppler, and power Doppler as needed of all accessible portions of each vessel. Bilateral testing is considered an integral part of a complete examination. Limited examinations for reoccurring indications may be performed as noted.  +---------+---------------+---------+-----------+----------+-------+  RIGHT     Compressibility Phasicity Spontaneity Properties Summary  +---------+---------------+---------+-----------+----------+-------+  CFV       Full            Yes       Yes                             +---------+---------------+---------+-----------+----------+-------+  SFJ       Full                                                      +---------+---------------+---------+-----------+----------+-------+  FV Prox   Full                                                       +---------+---------------+---------+-----------+----------+-------+  FV Mid    Full                                                      +---------+---------------+---------+-----------+----------+-------+  FV Distal Full                                                      +---------+---------------+---------+-----------+----------+-------+  PFV       Full                                                      +---------+---------------+---------+-----------+----------+-------+  POP       Full            Yes       Yes                             +---------+---------------+---------+-----------+----------+-------+  PTV       Full                                                      +---------+---------------+---------+-----------+----------+-------+  PERO      Full                                                      +---------+---------------+---------+-----------+----------+-------+   +---------+---------------+---------+-----------+----------+-------+  LEFT      Compressibility Phasicity Spontaneity Properties Summary  +---------+---------------+---------+-----------+----------+-------+  CFV       Full            Yes       Yes                             +---------+---------------+---------+-----------+----------+-------+  SFJ       Full                                                      +---------+---------------+---------+-----------+----------+-------+  FV Prox   Full                                                      +---------+---------------+---------+-----------+----------+-------+  FV Mid    Full                                                      +---------+---------------+---------+-----------+----------+-------+  FV Distal Full                                                      +---------+---------------+---------+-----------+----------+-------+  PFV       Full                                                      +---------+---------------+---------+-----------+----------+-------+  POP       Full             Yes       Yes                             +---------+---------------+---------+-----------+----------+-------+  PTV       Full                                                      +---------+---------------+---------+-----------+----------+-------+  PERO      Full                                                      +---------+---------------+---------+-----------+----------+-------+     Summary: Right: There is no evidence of deep vein thrombosis in the lower extremity. No cystic structure found in the popliteal fossa. Left: There is no evidence of deep vein thrombosis in the lower extremity. No cystic structure found in the popliteal fossa.  *See table(s) above for measurements and observations. Electronically signed by Deitra Mayo MD on 11/18/2018 at 11:42:50 AM.    Final    US Thoracentesis Asp Pleural Space W/img Guide  Result Date: 11/15/2018 INDICATION: Patient with history of metastatic breast cancer, dyspnea, bilateral pleural effusions L>R. Request is made for therapeutic left thoracentesis. EXAM: ULTRASOUND GUIDED THERAPEUTIC LEFT THORACENTESIS MEDICATIONS: 10 mL 1% lidocaine COMPLICATIONS: None immediate. PROCEDURE: An ultrasound guided thoracentesis was thoroughly discussed with the patient and questions answered. The benefits, risks, alternatives and complications were also discussed. The patient understands and wishes to proceed with the procedure. Written consent was obtained. Ultrasound was performed to localize and mark an adequate pocket of fluid in the left chest. The area was then prepped and draped in the normal sterile fashion. 1% Lidocaine was used for local anesthesia. Under ultrasound guidance a 6 Fr Safe-T-Centesis catheter was introduced. Thoracentesis was performed. The catheter was removed and a dressing applied. FINDINGS: A total of approximately 400 mL of dark red fluid was removed. IMPRESSION: Successful ultrasound guided left thoracentesis yielding 400 mL of  pleural fluid. Read by: Earley Abide, PA-C Electronically Signed   By: Markus Daft M.D.   On: 11/15/2018 14:32    Lab Data:  CBC: Recent Labs  Lab 11/23/18 0258 11/24/18 0247 11/25/18 0517 11/26/18 0514 11/27/18 0537 11/28/18 0459 11/29/18 0505  WBC 10.5 10.4 12.1* 10.3 8.6 8.4 6.8  NEUTROABS 5.7 5.9  --  6.0  --   --   --   HGB 8.6* 8.2* 8.2* 7.8* 7.5* 6.9* 8.1*  HCT 29.2* 27.4* 26.4* 25.7* 24.9* 22.3* 26.4*  MCV 105.8* 104.6* 102.3* 103.6* 104.2* 103.7* 100.0  PLT 112* 110* 120* 129* 150 187 263   Basic Metabolic Panel: Recent Labs  Lab 11/25/18 0517 11/26/18 0514 11/27/18 0537 11/28/18 0459 11/29/18 0505  NA 134* 133* 134* 134* 135  K 3.8 4.2 3.9 3.9 3.7  CL 92* 92* 94* 95* 93*  CO2 29 30 30 30  32  GLUCOSE 139* 132* 138* 133* 126*  BUN 22* 23* 21* 18 15  CREATININE 0.80 0.90 0.90 0.90 0.91  CALCIUM 6.9* 6.6* 6.4* 6.8* 7.0*  MG 2.1 2.1  --   --   --    GFR: Estimated Creatinine Clearance: 68.5 mL/min (by C-G formula based on SCr of 0.91 mg/dL). Liver Function Tests: Recent Labs  Lab 11/25/18 0517 11/27/18 0537 11/29/18 0505  AST 37 30 24  ALT 31 30 29   ALKPHOS 190* 201* 203*  BILITOT 1.0 0.6 0.6  PROT 7.1 7.0 7.1  ALBUMIN 2.5* 2.5* 2.5*   No results for input(s): LIPASE, AMYLASE in the last 168 hours. No results for input(s): AMMONIA in the last 168 hours. Coagulation Profile: No results for input(s): INR, PROTIME in the last 168 hours. Cardiac Enzymes: No results for input(s): CKTOTAL, CKMB, CKMBINDEX, TROPONINI in the last 168 hours. BNP (last 3 results) No results for input(s): PROBNP in the last 8760 hours. HbA1C: No results for input(s): HGBA1C in the last 72 hours. CBG: Recent Labs  Lab 11/22/18 1639  GLUCAP 161*   Lipid Profile: No results for input(s): CHOL, HDL, LDLCALC, TRIG, CHOLHDL, LDLDIRECT in the last 72 hours. Thyroid Function Tests: No results for input(s): TSH, T4TOTAL, FREET4, T3FREE, THYROIDAB in the last 72  hours. Anemia Panel: No results for input(s): VITAMINB12, FOLATE, FERRITIN, TIBC, IRON, RETICCTPCT in the last 72 hours. Urine analysis:    Component Value Date/Time   COLORURINE YELLOW 10/27/2018 2346   APPEARANCEUR HAZY (A) 10/27/2018 2346   LABSPEC 1.038 (H) 10/27/2018 2346   PHURINE 5.0 10/27/2018 2346   GLUCOSEU NEGATIVE 10/27/2018 2346   HGBUR NEGATIVE 10/27/2018 2346   BILIRUBINUR NEGATIVE 10/27/2018 2346   KETONESUR 5 (A) 10/27/2018 2346   PROTEINUR NEGATIVE 10/27/2018 2346   NITRITE NEGATIVE 10/27/2018 2346   LEUKOCYTESUR NEGATIVE 10/27/2018 2346     Elver Stadler M.D. [facility] 11/29/2018, 2:43 PM  Pager: [phone] Between 7am to 7pm - call Pager - [phone]  After 7pm go to [email or web address] - password [email or web address]  Call night coverage person covering after 7pm

## 2018-11-29 NOTE — Plan of Care (Signed)
Per case management, O2 has not been delivered, patient desats on ambulation, will be unsafe to discharge home today without O2.  DC home in a.m. after home health, DME O2 secured by case management.   Estill Cotta M.D. [facility] 11/29/2018, 2:42 PM  Pager: [phone]

## 2018-11-29 NOTE — Progress Notes (Signed)
SATURATION QUALIFICATIONS: (This note is used to comply with regulatory documentation for home oxygen)  Patient Saturations on Room Air at Rest = 87%  Patient Saturations on Room Air while Ambulating =84 %  Patient Saturations on 0 Liters of oxygen while Ambulating = 84%  Please briefly explain why patient needs home oxygen: Pt oxygen saturation 87% on room air at rest.

## 2018-11-29 NOTE — Progress Notes (Signed)
CM spoke with daughter over the telephone, patient was set up for a hospital bed and oxygen at home but neither have been delivered. [employer] set up to deliver but closed today due to holiday. Will follow-up in am.

## 2018-11-29 NOTE — Discharge Summary (Signed)
Physician Discharge Summary   Patient ID: Suzanne [name] MRN: [id number] DOB/AGE: December 31, 1960 58 y.o.  Admit date: 11/14/2018 Discharge date: 11/29/2018  Primary Care Physician:  Patient, No Pcp Per   Recommendations for Outpatient Follow-up:  1. Please obtain BMP, calcium in one week at follow-up appt  2. Lasix dose may need to be decreased to 40mg  daily if any worsening of renal function.  Home Health: Home health PT OT, RN home health aide, social work Equipment/Devices: DME home O2, hospital bed  O2 saturation qualifications Patient Saturations on Room Air at Rest = 94  Patient Saturations on Room Air while Ambulating = 88  Patient Saturations on 0 Liters of oxygen while Ambulating = 91%   Discharge Condition: stable  CODE STATUS: FULL  Diet recommendation: Heart healthy diet   Discharge Diagnoses:    . Acute on chronic respiratory failure with hypoxia ([place]) . Possible aspiration pneumonia . Pleural effusion, bilateral . Hypoalbuminemia . Acute on chronic respiratory failure with hypoxia ([place]) . Hypokalemia . Pleural effusion, malignant . 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 Microcytic anemia . Hypophosphatemia . Hypocalcemia Acute diastolic CHF  Consults: Oncology, Dr. Jana Hakim PCCM Palliative care, Dr. Rowe Pavy   Allergies:  No Known Allergies   DISCHARGE MEDICATIONS: Allergies as of 11/29/2018   No Known Allergies     Medication List    TAKE these medications   albuterol 108 (90 Base) MCG/ACT inhaler Commonly known as: VENTOLIN HFA Inhale 2 puffs into the lungs every 6 (six) hours as needed for wheezing or shortness of breath.   anastrozole 1 MG tablet Commonly known as: ARIMIDEX Take 1 tablet (1 mg total) by mouth daily.   benzonatate 200 MG capsule Commonly known as: TESSALON Take 1 capsule (200 mg total) by mouth 3 (three) times daily as needed for cough.   calcium-vitamin D 500-200 MG-UNIT  tablet Commonly known as: OSCAL WITH D Take 1 tablet by mouth 2 (two) times daily.   carvedilol 6.25 MG tablet Commonly known as: COREG Take 1 tablet (6.25 mg total) by mouth 2 (two) times daily with a meal.   clotrimazole 1 % cream Commonly known as: LOTRIMIN Apply topically 2 (two) times daily. For 2-4 weeks.   furosemide 20 MG tablet Commonly known as: LASIX Take 2 tablets (40 mg total) by mouth 2 (two) times daily. What changed:   how much to take  when to take this   HYDROcodone-homatropine 5-1.5 MG/5ML syrup Commonly known as: HYCODAN Take 5 mLs by mouth every 6 (six) hours as needed for cough.   loratadine 10 MG tablet Commonly known as: CLARITIN Take 1 tablet (10 mg total) by mouth daily.   nystatin powder Commonly known as: MYCOSTATIN/NYSTOP Apply topically 3 (three) times daily. Apply under breast area   oxyCODONE 5 MG immediate release tablet Commonly known as: Oxy IR/ROXICODONE Take 0.5 tablets (2.5 mg total) by mouth every 4 (four) hours as needed for moderate pain or severe pain.   palbociclib 125 MG capsule Commonly known as: IBRANCE Take 1 capsule (125 mg total) by mouth daily with breakfast. Take whole with food. Take for 21 days on, 7 days off, repeat every 28 days.   polyethylene glycol 17 g packet Commonly known as: MIRALAX / GLYCOLAX Take 17 g by mouth as needed for constipation.            Durable Medical Equipment  (From admission, onward)         Start  Ordered   11/29/18 1058  For home use only DME oxygen  Once    Question Answer Comment  Length of Need 6 Months   Mode or (Route) Nasal cannula   Liters per Minute 2   Frequency Continuous (stationary and portable oxygen unit needed)   Oxygen conserving device Yes   Oxygen delivery system Gas      11/29/18 1057   11/28/18 1448  For home use only DME Hospital bed  Once    Question Answer Comment  Length of Need 6 Months   Patient has (list medical condition): Acute on  chronic respiratory failure with hypoxia ([place]) likely multifactorial secondary to malignant pleural effusions, metastatic stage IV breast CA, possible aspiration pneumonia, volume overload   The above medical condition requires: Patient requires the ability to reposition frequently   Head must be elevated greater than: 45 degrees   Bed type Semi-electric      11/28/18 1450           Brief H and P: For complete details please refer to admission H and P, but in briefPatient is a 58 year old female 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 Patient admitted underwent thoracentesis of the right and left lungs on 6/2 and 6/3 and scheduled for repeat 3 days later. Patient was admitted respiratory status worsened and patient placed on the BiPAP and transferred to the stepdown unit. Patient also placed on IV diuretics. Patient noted to spike a fever and as such also placed on IV antibiotics for presumed aspiration pneumonia. Due to tenuous respiratory status critical care consulted. Patient underwent left-sided thoracentesis on 11/15/2018 with 400 cc of back red pleural fluid removed. Cytology from thoracentesis of 10/28/2018+ for malignant cells consistent with breast cancer. COVID-19 negative. IV antibiotics narrowed down to oral Augmentin starting 11/19/2018. Lower extremity Dopplers which were done were negative.  Oncology and palliative care was consulted for goals of care.   Hospital Course:  Acute on chronic respiratory failure with hypoxia ([facility]) likely multifactorial secondary to malignant pleural effusions, metastatic stage IV breast CA, possible aspiration pneumonia, volume overload -CT chest on  6/22 had shown widespread metastatic disease to the bones, multiple bilateral rib fractures, medial aspect of right clavicle fracture, probable adjacent intramuscular hemorrhage on the right lower sternocleidomastoid muscle and upper right pectoralis major muscle, probable tumor infiltration throughout the right breast as well as bilateral axillary and subpectoral lymphadenopathy.  Possibility of lymphangitic spread of the tumor in the lungs. -Status post thoracentesis on 6/28 on the left with 400 cc of dark red pleural fluid removed -Post thoracentesis, patient had increased O2 requirements and went into respiratory distress, needed BiPAP and diuresis. -Currently much improved, O2 sats 100% on 2 L via nasal cannula. -Patient was placed on aggressive IV Lasix diuresis.  Negative balance of 14 L neg -Continue Lasix 40 mg twice daily, patient will need bmet in 1 week, if creatinine trending up, then decrease Lasix to 40 mg once a day -COVID-19 test negative, repeat COVID-19 test negative  Hypocalcemia -Likely secondary to recent Xgeva injection on 6/21. -Continue oral calcium replacement to 1 tab BID, corrected calcium 8.2 at the time of discharge.  Follow calcium level in 1 week.  Malignant pleural effusion/loculated pleural effusion in the  setting of right-sided inflammatory stage IV breast CA, mets involving lungs, lymph nodes, bones -Status post thoracentesis, cytology positive for malignant cells consistent with breast cancer. -Patient may require Pleurx catheter if recurrence of effusion however not at this time per CCM. -Oncology, palliative care were consulted.  Continue arimidex -Oncology started patient on palbociclib on 6/30. -Continue Lasix, follow renal function  Probable aspiration pneumonia -Patient was initially placed on IV antibiotics empirically, transitioned to oral Augmentin, patient has completed course of antibiotics  Microcytic anemia -Patient refused blood transfusion  yesterday -Patient received 1 unit of packed RBC transfusion on 7/3 for hemoglobin of 6.9.  She tolerated blood transfusion without any difficulty -Hemoglobin stable 8.1 at the time of discharge.  Volume overload, suspected acute diastolic CHF in the setting of blood transfusion -On 5/31, patient received 2 units packed RBCs for hemoglobin of 4.7, subsequently went into respiratory distress, needed BiPAP and aggressive IV diuresis. - 2D echo 6/1 had shown EF of 60 to 09% with diastolic parameters indeterminate. -Hemoglobin 8.1 after transfusion again on 7/3, tolerated without any difficulty.  Sinus tachycardia Resolved  Diarrhea Resolved  Day of Discharge S: Doing well, shortness of breath is improved, no chest pain no fevers or chills.  Peripheral edema has improved.  Tolerating blood transfusion on 7/3 without any difficulty.  BP 122/61 (BP Location: Right Arm)   Pulse 87   Temp 98.3 F (36.8 C) (Oral)   Resp 18   Ht 5\' 2"  (1.575 m)   Wt 87.9 kg   LMP 05/28/2013 (Within Months)   SpO2 100%   BMI 35.44 kg/m   Physical Exam: General: Alert and awake oriented x3 not in any acute distress. HEENT: anicteric sclera, pupils reactive to light and accommodation CVS: S1-S2 clear no murmur rubs or gallops Chest: Decreased breath sound at the bases otherwise fairly clear, no wheezing Abdomen: soft nontender, nondistended, normal bowel sounds Extremities: no cyanosis, clubbing or edema noted bilaterally Neuro: Cranial nerves II-XII intact, no focal neurological deficits   The results of significant diagnostics from this hospitalization (including imaging, microbiology, ancillary and laboratory) are listed below for reference.      Procedures/Studies: 2D echo 6-05/2018   1. The left ventricle has normal systolic function with an ejection fraction of 60-65%. Left ventricular diastolic Doppler parameters are indeterminate.  2. Left atrial size was not well visualized.  3. The  aortic valve was not well visualized. No stenosis of the aortic valve.  4. The interatrial septum was not well visualized.  Dg Chest 1 View  Result Date: 11/15/2018 CLINICAL DATA:  Post left-sided thoracentesis. EXAM: CHEST  1 VIEW COMPARISON:  11/14/2018 FINDINGS: Lungs are hypoinflated demonstrate interval improvement with moderate residual left-sided pleural effusion likely with associated basilar atelectasis. No pneumothorax. Mild prominence of the perihilar markings without significant change. Cardiomediastinal silhouette and remainder of the exam is unchanged. IMPRESSION: Hypoinflation with interval improvement in left pleural effusion post thoracentesis. No pneumothorax. Moderate residual left effusion likely with associated basilar atelectasis. Prominence of the perihilar markings suggesting vascular congestion. Electronically Signed   By: Marin Olp M.D.   On: 11/15/2018 13:58   Dg Chest 2 View  Result Date: 11/14/2018 CLINICAL DATA:  Stage IV breast cancer.  Shortness of breath. EXAM: CHEST - 2 VIEW COMPARISON:  Radiographs 11/13/2018 and 10/29/2018.  CT 10/26/2018. FINDINGS: The heart size and mediastinal contours are stable. Left pleural effusion and left basilar opacity are unchanged from the recent study. Diffusely increased interstitial markings are again noted throughout  both lungs, remaining suspicious for lymphangitic spread of tumor based on previous CT. No progressive airspace opacities identified. Diffuse osseous metastatic disease noted without evidence of acute fracture. IMPRESSION: Stable radiographic appearance of the chest since yesterday. A left pleural effusion and left basilar pulmonary opacity have increased from older prior studies. Evidence of extensive metastatic disease again noted. Electronically Signed   By: Richardean Sale M.D.   On: 11/14/2018 17:10   Dg Chest 2 View  Result Date: 11/13/2018 CLINICAL DATA:  Shortness of breath, weakness. Cancer patient. EXAM:  CHEST - 2 VIEW COMPARISON:  Chest x-ray dated 10/29/2018. Chest CT dated 10/26/2018. FINDINGS: Heart size and mediastinal contours appear stable. Increased opacity at the LEFT lung base, likely increased pleural effusion. There is also central pulmonary vascular congestion and bilateral perihilar edema. No pneumothorax seen. Diffuse osseous metastases, as described on previous CT report. IMPRESSION: 1. Increased opacity at the LEFT lung base, likely increased pleural effusion. Superimposed pneumonia or airspace collapse not excluded. 2. Central pulmonary vascular congestion and bilateral perihilar edema indicating CHF/volume overload. 3. Diffuse osseous metastases. Electronically Signed   By: Franki Cabot M.D.   On: 11/13/2018 12:32   Ct Chest Wo Contrast  Result Date: 11/17/2018 CLINICAL DATA:  58 year old female Caucasian female nonsmoker with history of bilateral lower extremity edema. Possible stage IV breast cancer. EXAM: CT CHEST WITHOUT CONTRAST TECHNIQUE: Multidetector CT imaging of the chest was performed following the standard protocol without IV contrast. COMPARISON:  Chest CT 10/26/2018. FINDINGS: Cardiovascular: Heart size is normal. There is no significant pericardial fluid, thickening or pericardial calcification. There is aortic atherosclerosis, as well as atherosclerosis of the great vessels of the mediastinum and the coronary arteries, including calcified atherosclerotic plaque in the left anterior descending and right coronary arteries. Mediastinum/Nodes: No pathologically enlarged mediastinal or hilar lymph nodes. Please note that accurate exclusion of hilar adenopathy is limited on noncontrast CT scans. Esophagus is unremarkable in appearance. Multiple borderline enlarged and mildly enlarged bilateral axillary and subpectoral lymph nodes (right greater than left) measuring up to 15 mm in short axis on the right (axial image 49 of series 2). Lungs/Pleura: Extensive septal thickening with  band like opacities throughout both lungs, similar to the prior study. No discrete pulmonary nodules or masses are confidently identified. Moderate left and small right pleural effusions. The left pleural effusion is predominantly sub pulmonic in position. Upper Abdomen: Several peripherally calcified gallstones lying dependently in the gallbladder measuring up to 1.9 cm in diameter. Aortic atherosclerosis. Musculoskeletal: Right breast is incompletely imaged, but there appears to be irregular soft tissue infiltration throughout much of the right breast tissue, which could reflect tumoral in for tray shin and/or edema. Diffuse lytic and sclerotic lesions throughout the visualized axial and appendicular skeleton, indicative of widespread metastatic disease to the bones. With appears very similar to the prior examination. There continue to be multiple nondisplaced to minimally displaced pathologic fractures throughout the ribs bilaterally. In addition, there is a new widely displaced fracture of the medial aspect of the right clavicle with approximately 1.4 cm of lateral and anterior displacement, as well as high attenuation thickening of the adjacent musculature in the upper right pectoralis major and lower right sternocleidomastoid muscles, most compatible with intramuscular hematoma and/or tumor. IMPRESSION: 1. Widespread metastatic disease to the bones redemonstrated, with multiple nondisplaced minimally displaced bilateral rib fractures, as well as a new widely displaced fracture of the medial aspect of the right clavicle with probable adjacent intramuscular hemorrhage in the lower right sternocleidomastoid  muscle and upper right pectoralis major muscle. Probable tumoral infiltration throughout the right breast, as well as bilateral axillary and subpectoral lymphadenopathy (right greater than left). 2. Unusual appearance of the lungs, similar to the prior study, which could suggest chronic systemic disease such  as sarcoidosis or interstitial lung disease. The possibility of lymphangitic spread of tumor in the lungs could be considered, but is not strongly favored at this time. 3. Moderate left (predominantly subpulmonic) pleural effusion and small right pleural effusion. 4. Cholelithiasis. 5. Aortic atherosclerosis, in addition to 2 vessel coronary artery disease. Aortic Atherosclerosis (ICD10-I70.0). Electronically Signed   By: Vinnie Langton M.D.   On: 11/17/2018 14:55   Dg Chest Port 1 View  Result Date: 11/20/2018 CLINICAL DATA:  Hypoxia, metastatic breast cancer EXAM: PORTABLE CHEST 1 VIEW COMPARISON:  Portable exam 1200 hours compared to 11/19/2018 FINDINGS: Very low lung volumes. Stable heart size mediastinal contours. BILATERAL pulmonary opacities again identified, unchanged. No new infiltrate, pleural effusion or pneumothorax. Scattered areas of osseous sclerosis consistent with osseous metastatic disease IMPRESSION: Widespread osseous metastatic disease. Persistent pulmonary opacities. Electronically Signed   By: Lavonia Dana M.D.   On: 11/20/2018 14:06   Dg Chest Port 1 View  Result Date: 11/19/2018 CLINICAL DATA:  Shortness of breath EXAM: PORTABLE CHEST 1 VIEW COMPARISON:  Yesterday FINDINGS: Very low volume chest with patchy bilateral infiltrate greater on the right. Some of these are streaky and suggestive of scarring or atelectasis. No visible effusion or pneumothorax. Largely obscured heart which is not enlarged by recent CT. Extensive osseous metastatic disease IMPRESSION: Stable low volume chest with atelectasis and interstitial opacity, reference chest CT from 2 days ago. Electronically Signed   By: Monte Fantasia M.D.   On: 11/19/2018 08:54   Dg Chest Port 1 View  Result Date: 11/18/2018 CLINICAL DATA:  Shortness of breath and increased weakness this morning; history metastatic breast cancer EXAM: PORTABLE CHEST 1 VIEW COMPARISON:  Portable exam 0823 hours compared to 11/17/2018 FINDINGS:  Very low lung volumes. Normal heart size mediastinal contours. Patchy opacities in the lungs bilaterally favoring pneumonia over atelectasis. No pleural effusion or pneumothorax. Patchy osteosclerosis consistent with osseous metastatic disease. IMPRESSION: BILATERAL pulmonary opacities likely representing multifocal pneumonia. Electronically Signed   By: Lavonia Dana M.D.   On: 11/18/2018 09:30   Dg Chest Port 1 View  Result Date: 11/17/2018 CLINICAL DATA:  Shortness of breath.  Metastatic breast cancer. EXAM: PORTABLE CHEST 1 VIEW COMPARISON:  11/16/2018.  CT 10/26/2018. FINDINGS: Mediastinum stable. Heart size normal. Persistent left base atelectasis infiltrate. Right base atelectasis/infiltrate also noted on today's exam. Diffuse bilateral interstitial prominence again noted. Tiny left pleural effusion again noted. No pneumothorax. Multiple lytic lesions noted throughout the visualized bony structures. IMPRESSION: 1. Persistent left base atelectasis/infiltrate. Right base atelectasis/infiltrate also noted on today's exam. Small left pleural effusion again noted. 2. Diffuse bilateral interstitial prominence again noted consistent with interstitial tumor spread this patient with known metastatic disease. 3. Multiple lytic lesions are again noted throughout the visualized bony structures consistent with metastatic disease. Electronically Signed   By: Marcello Moores  Register   On: 11/17/2018 06:24   Dg Chest Port 1 View  Result Date: 11/16/2018 CLINICAL DATA:  Dyspnea, metastatic breast cancer EXAM: PORTABLE CHEST 1 VIEW COMPARISON:  Chest radiograph from one day prior. FINDINGS: Low lung volumes. Stable cardiomediastinal silhouette with mild cardiomegaly. No pneumothorax. Stable small bilateral pleural effusions. Stable patchy left lung base consolidation. Stable thick irregular reticular opacities in the upper lungs. IMPRESSION:  1. Stable low lung volumes with small bilateral pleural effusions. 2. Stable thick  irregular reticular opacities in the upper lungs and patchy left lung base consolidation. Findings likely representing combination of lymphangitic tumor (as seen on recent chest CT) and atelectasis. A component of mild pulmonary edema cannot be excluded. Electronically Signed   By: Ilona Sorrel M.D.   On: 11/16/2018 07:13   Dg Abd 2 Views  Result Date: 11/13/2018 CLINICAL DATA:  Pt c/o sob, weakness, cancer patient. EXAM: ABDOMEN - 2 VIEW COMPARISON:  None. FINDINGS: Overall visualized bowel gas pattern is nonobstructive. Moderate amount of stool and gas throughout the colon. No evidence of free intraperitoneal air seen. Diffuse osseous metastases, as previously described. IMPRESSION: 1. Nonobstructive bowel gas pattern. Moderate amount of stool and gas throughout the colon. 2. Diffuse osseous metastases. Electronically Signed   By: Franki Cabot M.D.   On: 11/13/2018 12:33   Vas [place] Lower Extremity Venous (dvt)  Result Date: 11/18/2018  Lower Venous Study Indications: Swelling.  Risk Factors: Cancer. Limitations: Body habitus and poor ultrasound/tissue interface. Comparison Study: No prior studies. Performing Technologist: Oliver Hum RVT  Examination Guidelines: A complete evaluation includes B-mode imaging, spectral Doppler, color Doppler, and power Doppler as needed of all accessible portions of each vessel. Bilateral testing is considered an integral part of a complete examination. Limited examinations for reoccurring indications may be performed as noted.  +---------+---------------+---------+-----------+----------+-------+ RIGHT    CompressibilityPhasicitySpontaneityPropertiesSummary +---------+---------------+---------+-----------+----------+-------+ CFV      Full           Yes      Yes                          +---------+---------------+---------+-----------+----------+-------+ SFJ      Full                                                  +---------+---------------+---------+-----------+----------+-------+ FV Prox  Full                                                 +---------+---------------+---------+-----------+----------+-------+ FV Mid   Full                                                 +---------+---------------+---------+-----------+----------+-------+ FV DistalFull                                                 +---------+---------------+---------+-----------+----------+-------+ PFV      Full                                                 +---------+---------------+---------+-----------+----------+-------+ POP      Full           Yes      Yes                          +---------+---------------+---------+-----------+----------+-------+  PTV      Full                                                 +---------+---------------+---------+-----------+----------+-------+ PERO     Full                                                 +---------+---------------+---------+-----------+----------+-------+   +---------+---------------+---------+-----------+----------+-------+ LEFT     CompressibilityPhasicitySpontaneityPropertiesSummary +---------+---------------+---------+-----------+----------+-------+ CFV      Full           Yes      Yes                          +---------+---------------+---------+-----------+----------+-------+ SFJ      Full                                                 +---------+---------------+---------+-----------+----------+-------+ FV Prox  Full                                                 +---------+---------------+---------+-----------+----------+-------+ FV Mid   Full                                                 +---------+---------------+---------+-----------+----------+-------+ FV DistalFull                                                 +---------+---------------+---------+-----------+----------+-------+ PFV      Full                                                  +---------+---------------+---------+-----------+----------+-------+ POP      Full           Yes      Yes                          +---------+---------------+---------+-----------+----------+-------+ PTV      Full                                                 +---------+---------------+---------+-----------+----------+-------+ PERO     Full                                                 +---------+---------------+---------+-----------+----------+-------+  Summary: Right: There is no evidence of deep vein thrombosis in the lower extremity. No cystic structure found in the popliteal fossa. Left: There is no evidence of deep vein thrombosis in the lower extremity. No cystic structure found in the popliteal fossa.  *See table(s) above for measurements and observations. Electronically signed by Deitra Mayo MD on 11/18/2018 at 11:42:50 AM.    Final    US Thoracentesis Asp Pleural Space W/img Guide  Result Date: 11/15/2018 INDICATION: Patient with history of metastatic breast cancer, dyspnea, bilateral pleural effusions L>R. Request is made for therapeutic left thoracentesis. EXAM: ULTRASOUND GUIDED THERAPEUTIC LEFT THORACENTESIS MEDICATIONS: 10 mL 1% lidocaine COMPLICATIONS: None immediate. PROCEDURE: An ultrasound guided thoracentesis was thoroughly discussed with the patient and questions answered. The benefits, risks, alternatives and complications were also discussed. The patient understands and wishes to proceed with the procedure. Written consent was obtained. Ultrasound was performed to localize and mark an adequate pocket of fluid in the left chest. The area was then prepped and draped in the normal sterile fashion. 1% Lidocaine was used for local anesthesia. Under ultrasound guidance a 6 Fr Safe-T-Centesis catheter was introduced. Thoracentesis was performed. The catheter was removed and a dressing applied. FINDINGS: A  total of approximately 400 mL of dark red fluid was removed. IMPRESSION: Successful ultrasound guided left thoracentesis yielding 400 mL of pleural fluid. Read by: Earley Abide, PA-C Electronically Signed   By: Markus Daft M.D.   On: 11/15/2018 14:32      LAB RESULTS: Basic Metabolic Panel: Recent Labs  Lab 11/26/18 0514  11/28/18 0459 11/29/18 0505  NA 133*   < > 134* 135  K 4.2   < > 3.9 3.7  CL 92*   < > 95* 93*  CO2 30   < > 30 32  GLUCOSE 132*   < > 133* 126*  BUN 23*   < > 18 15  CREATININE 0.90   < > 0.90 0.91  CALCIUM 6.6*   < > 6.8* 7.0*  MG 2.1  --   --   --    < > = values in this interval not displayed.   Liver Function Tests: Recent Labs  Lab 11/27/18 0537 11/29/18 0505  AST 30 24  ALT 30 29  ALKPHOS 201* 203*  BILITOT 0.6 0.6  PROT 7.0 7.1  ALBUMIN 2.5* 2.5*   No results for input(s): LIPASE, AMYLASE in the last 168 hours. No results for input(s): AMMONIA in the last 168 hours. CBC: Recent Labs  Lab 11/26/18 0514  11/28/18 0459 11/29/18 0505  WBC 10.3   < > 8.4 6.8  NEUTROABS 6.0  --   --   --   HGB 7.8*   < > 6.9* 8.1*  HCT 25.7*   < > 22.3* 26.4*  MCV 103.6*   < > 103.7* 100.0  PLT 129*   < > 187 189   < > = values in this interval not displayed.   Cardiac Enzymes: No results for input(s): CKTOTAL, CKMB, CKMBINDEX, TROPONINI in the last 168 hours. BNP: Invalid input(s): POCBNP CBG: Recent Labs  Lab 11/22/18 1639  GLUCAP 161*      Disposition and Follow-up: Discharge Instructions    (HEART FAILURE PATIENTS) Call MD:  Anytime you have any of the following symptoms: 1) 3 pound weight gain in 24 hours or 5 pounds in 1 week 2) shortness of breath, with or without a dry hacking cough 3) swelling in the hands, feet or stomach 4)  if you have to sleep on extra pillows at night in order to breathe.   Complete by: As directed    Diet - low sodium heart healthy   Complete by: As directed    Increase activity slowly   Complete by: As  directed        DISPOSITION: Home with home health PT OT   [place]    [name], Virgie Dad, MD. Schedule an appointment as soon as possible for a visit in 1 week(s).   Specialty: Oncology Why: please check CBC and BMET Contact information: [place] [address] [phone]            Time coordinating discharge:  40 minutes  Signed:   Estill Cotta M.D. [facility] 11/29/2018, 11:22 AM

## 2018-11-29 NOTE — Plan of Care (Signed)

## 2018-11-30 LAB — BASIC METABOLIC PANEL
Anion gap: 10 (ref 5–15)
BUN: 16 mg/dL (ref 6–20)
CO2: 30 mmol/L (ref 22–32)
Calcium: 6.5 mg/dL — ABNORMAL LOW (ref 8.9–10.3)
Chloride: 93 mmol/L — ABNORMAL LOW (ref 98–111)
Creatinine, Ser: 0.82 mg/dL (ref 0.44–1.00)
GFR calc Af Amer: >60 mL/min (ref >60)
GFR calc non Af Amer: >60 mL/min (ref >60)
Glucose, Bld: 156 mg/dL — ABNORMAL HIGH (ref 70–99)
Potassium: 3.3 mmol/L — ABNORMAL LOW (ref 3.5–5.1)
Sodium: 133 mmol/L — ABNORMAL LOW (ref 135–145)

## 2018-11-30 MED ORDER — CALCIUM CARBONATE-VITAMIN D 500-200 MG-UNIT PO TABS: 1.0000 | ORAL_TABLET | Freq: Three times a day (TID) | ORAL | 1 refills | Status: DC

## 2018-11-30 MED ORDER — CALCIUM CARBONATE-VITAMIN D 500-200 MG-UNIT PO TABS
1.0000 | ORAL_TABLET | Freq: Three times a day (TID) | ORAL | Status: DC
  Administered 2018-11-30 – 2018-12-01 (×3): 1 via ORAL
  Filled 2018-11-30 (×3): qty 1

## 2018-11-30 NOTE — Progress Notes (Signed)
Patient called this nurse into room concerning blood tinged sputum coughed up. Pt had kept the tissue for this nurse to assess. Small amount of blood, MD aware. Will continue to monitor.

## 2018-11-30 NOTE — TOC Progression Note (Signed)
Transition of Care [facility]) - Progression Note    Patient Details  Name: Suzanne [name] MRN: [id number] Date of Birth: 07/16/1960  [facility] CM/SW Contact  Joaquin Courts, RN Phone Number: 11/30/2018, 5:04 PM  Clinical Narrative:  CM spoke with [employer] rep, Keon, who confirms that O2 and hospital bed were set up for delivery to patients home. [employer] rep, Glyn Ade, contacted to verify that [employer] will continue to provide patient's home health as she was previously active with the company. [employer] rep was able to confirm that care will resume for the patient, however, the necessary equipment must be present in the home prior to discharge. CM received confirmation from patient's daughter that the O2 was delivered to the home but the hospital bed was not.   CM reached out to [employer] rep for a time frame for delivery, however it was found that the request was not properly submitted by rep and therefore not processed. CM asked and the request was resubmitted however no time frame was given for delivery.       Expected Discharge Plan: Skilled Nursing Facility Barriers to Discharge: Continued Medical Work up  Expected Discharge Plan and Services Expected Discharge Plan: [place]   Discharge Planning Services: CM Consult   Living arrangements for the past 2 months: Single Family Home Expected Discharge Date: 11/30/18                                     Social Determinants of Health (SDOH) Interventions    Readmission Risk Interventions Readmission Risk Prevention Plan 11/17/2018  Transportation Screening Complete  PCP or Specialist Appt within 3-5 Days Not Complete  Not Complete comments will need pcp set up  [place] or Outlook Complete  Social Work Consult for [place] Planning/Counseling Complete  Palliative Care Screening Not Complete  Palliative Care Screening Not Complete Comments pending- bedside RN states plan to  consult palliative this admission  Medication Review (RN Care Manager) Complete  Some recent data might be hidden

## 2018-11-30 NOTE — Discharge Summary (Signed)
Physician Discharge Summary   Patient ID: Suzanne [name] MRN: [id number] DOB/AGE: [birth year] 60 y.o.  Admit date: 11/14/2018 Discharge date: 12/01/2018  Primary Care Physician:  Patient, No Pcp Per   Recommendations for Outpatient Follow-up:  1. Please obtain BMP, calcium in one week at follow-up appt  2. Lasix dose may need to be decreased to 40mg  daily if any worsening of renal function.  Home Health: Home health PT OT, RN home health aide, social work Equipment/Devices: DME home O2, hospital bed  O2 saturation qualifications Patient Saturations on Room Air at Rest = 94  Patient Saturations on Room Air while Ambulating = 88  Patient Saturations on 0 Liters of oxygen while Ambulating = 91%   Discharge Condition: stable  CODE STATUS: FULL  Diet recommendation: Heart healthy diet   Discharge Diagnoses:    . Acute on chronic respiratory failure with hypoxia ([place]) . Possible aspiration pneumonia . Pleural effusion, bilateral . Hypoalbuminemia . Acute on chronic respiratory failure with hypoxia ([place]) . Hypokalemia . Pleural effusion, malignant . 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 Microcytic anemia . Hypophosphatemia . Hypocalcemia Acute diastolic CHF  Consults: Oncology, Dr. Jana Hakim PCCM Palliative care, Dr. Rowe Pavy   Allergies:  No Known Allergies   DISCHARGE MEDICATIONS: Allergies as of 12/01/2018   No Known Allergies     Medication List    TAKE these medications   albuterol 108 (90 Base) MCG/ACT inhaler Commonly known as: VENTOLIN HFA Inhale 2 puffs into the lungs every 6 (six) hours as needed for wheezing or shortness of breath.   anastrozole 1 MG tablet Commonly known as: ARIMIDEX Take 1 tablet (1 mg total) by mouth daily. Notes to patient: 12/02/18   benzonatate 200 MG capsule Commonly known as: TESSALON Take 1 capsule (200 mg total) by mouth 3 (three) times daily as needed for cough.   calcium-vitamin  D 500-200 MG-UNIT tablet Commonly known as: OSCAL WITH D Take 1 tablet by mouth 3 (three) times daily. Notes to patient: Afternoon, evening 12/01/18   carvedilol 6.25 MG tablet Commonly known as: COREG Take 1 tablet (6.25 mg total) by mouth 2 (two) times daily with a meal.   clotrimazole 1 % cream Commonly known as: LOTRIMIN Apply topically 2 (two) times daily. For 2-4 weeks. Notes to patient: 12/01/18   furosemide 20 MG tablet Commonly known as: LASIX Take 2 tablets (40 mg total) by mouth 2 (two) times daily. What changed:   how much to take  when to take this Notes to patient: Evening, 12/01/18   HYDROcodone-homatropine 5-1.5 MG/5ML syrup Commonly known as: HYCODAN Take 5 mLs by mouth every 6 (six) hours as needed for cough. Notes to patient: Afternoon, evening 12/01/18   loratadine 10 MG tablet Commonly known as: CLARITIN Take 1 tablet (10 mg total) by mouth daily. Notes to patient: 12/02/18   nystatin powder Commonly known as: MYCOSTATIN/NYSTOP Apply topically 3 (three) times daily. Apply under breast area Notes to patient: Afternoon, evening 12/01/18   oxyCODONE 5 MG immediate release tablet Commonly known as: Oxy IR/ROXICODONE Take 0.5 tablets (2.5 mg total) by mouth every 4 (four) hours as needed for moderate pain or severe pain. Notes to patient: 12/01/18   palbociclib 125 MG capsule Commonly known as: IBRANCE Take 1 capsule (125 mg total) by mouth daily with breakfast. Take whole with food. Take for 21 days on, 7 days off, repeat every 28 days. Notes to patient: 12/02/18   polyethylene glycol 17 g  packet Commonly known as: MIRALAX / GLYCOLAX Take 17 g by mouth as needed for constipation. Notes to patient: 12/01/18            Durable Medical Equipment  (From admission, onward)         Start     Ordered   11/29/18 1058  For home use only DME oxygen  Once    Question Answer Comment  Length of Need 6 Months   Mode or (Route) Nasal cannula   Liters per Minute 2    Frequency Continuous (stationary and portable oxygen unit needed)   Oxygen conserving device Yes   Oxygen delivery system Gas      11/29/18 1057   11/28/18 1448  For home use only DME Hospital bed  Once    Question Answer Comment  Length of Need 6 Months   Patient has (list medical condition): Acute on chronic respiratory failure with hypoxia ([place]) likely multifactorial secondary to malignant pleural effusions, metastatic stage IV breast CA, possible aspiration pneumonia, volume overload   The above medical condition requires: Patient requires the ability to reposition frequently   Head must be elevated greater than: 45 degrees   Bed type Semi-electric      11/28/18 1450           Brief H and P: For complete details please refer to admission H and P, but in briefPatient is a 58 year old female 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 Patient admitted underwent thoracentesis of the right and left lungs on 6/2 and 6/3 and scheduled for repeat 3 days later. Patient was admitted respiratory status worsened and patient placed on the BiPAP and transferred to the stepdown unit. Patient also placed on IV diuretics. Patient noted to spike a fever and as such also placed on IV antibiotics for presumed aspiration pneumonia. Due to tenuous respiratory status critical care consulted. Patient underwent left-sided thoracentesis on 11/15/2018 with 400 cc of back red pleural fluid removed. Cytology from thoracentesis of 10/28/2018+ for malignant cells consistent with breast cancer. COVID-19 negative. IV antibiotics narrowed down to oral Augmentin starting 11/19/2018. Lower extremity Dopplers which were done were  negative.  Oncology and palliative care was consulted for goals of care.   Hospital Course:  Acute on chronic respiratory failure with hypoxia ([facility]) likely multifactorial secondary to malignant pleural effusions, metastatic stage IV breast CA, possible aspiration pneumonia, volume overload -CT chest on 6/22 had shown widespread metastatic disease to the bones, multiple bilateral rib fractures, medial aspect of right clavicle fracture, probable adjacent intramuscular hemorrhage on the right lower sternocleidomastoid muscle and upper right pectoralis major muscle, probable tumor infiltration throughout the right breast as well as bilateral axillary and subpectoral lymphadenopathy.  Possibility of lymphangitic spread of the tumor in the lungs. -Status post thoracentesis on 6/28 on the left with 400 cc of dark red pleural fluid removed -Post thoracentesis, patient had increased O2 requirements and went into respiratory distress, needed BiPAP and diuresis. -Currently much improved, O2 sats 96% on 2 L. -Patient was placed on aggressive IV Lasix diuresis.  Negative balance of 14 L neg -Continue Lasix 40 mg twice daily, patient will need bmet in 1 week, if creatinine trending up, then decrease Lasix to 40 mg once a day -  COVID-19 test negative, repeat COVID-19 test negative  Hypocalcemia -Likely secondary to recent Xgeva injection on 6/21. -Continue oral calcium replacement 1 tab 3 times daily, corrected calcium 7.7 at the time of discharge - Follow calcium level in 1 week.  Malignant pleural effusion/loculated pleural effusion in the setting of right-sided inflammatory stage IV breast CA, mets involving lungs, lymph nodes, bones -Status post thoracentesis, cytology positive for malignant cells consistent with breast cancer. -Patient may require Pleurx catheter if recurrence of effusion however not at this time per CCM. -Oncology, palliative care were consulted.  Continue arimidex -Oncology started  patient on palbociclib on 6/30. -Continue Lasix, creatinine stable at 0.8, will need bmet in 1 week  Probable aspiration pneumonia -Patient was initially placed on IV antibiotics empirically, transitioned to oral Augmentin, patient has completed course of antibiotics  Microcytic anemia -Patient received 1 unit of packed RBC transfusion on 7/3 for hemoglobin of 6.9.  She tolerated blood transfusion without any difficulty -Hemoglobin stable 8.1  Volume overload, suspected acute diastolic CHF in the setting of blood transfusion -On 5/31, patient received 2 units packed RBCs for hemoglobin of 4.7, subsequently went into respiratory distress, needed BiPAP and aggressive IV diuresis. - 2D echo 6/1 had shown EF of 60 to 71% with diastolic parameters indeterminate. -Hemoglobin 8.1 after transfusion again on 7/3, tolerated without any difficulty.  Sinus tachycardia Resolved  Diarrhea Resolved  Disposition Home health PT OT, RN, home O2.  DME hospital bed.   Day of Discharge S: Frustrated, really wants to go home today.  Somewhat tearful.  No complaints.  BP 119/70 (BP Location: Right Arm)   Pulse 94   Temp 98.4 F (36.9 C) (Oral)   Resp 20   Ht 5\' 2"  (1.575 m)   Wt 86.7 kg   LMP 05/28/2013 (Within Months)   SpO2 96%   BMI 34.95 kg/m   Physical Exam: General: Alert and awake oriented x3 not in any acute distress. HEENT: anicteric sclera, pupils reactive to light and accommodation CVS: S1-S2 clear no murmur rubs or gallops Chest: Decreased breath sound at the bases otherwise fairly clear, no wheezing Abdomen: soft nontender, nondistended, normal bowel sounds Extremities: no cyanosis, clubbing or edema noted bilaterally Neuro: Cranial nerves II-XII intact, no focal neurological deficits   The results of significant diagnostics from this hospitalization (including imaging, microbiology, ancillary and laboratory) are listed below for reference.       Procedures/Studies: 2D echo 6-05/2018   1. The left ventricle has normal systolic function with an ejection fraction of 60-65%. Left ventricular diastolic Doppler parameters are indeterminate.  2. Left atrial size was not well visualized.  3. The aortic valve was not well visualized. No stenosis of the aortic valve.  4. The interatrial septum was not well visualized.  Dg Chest 1 View  Result Date: 11/15/2018 CLINICAL DATA:  Post left-sided thoracentesis. EXAM: CHEST  1 VIEW COMPARISON:  11/14/2018 FINDINGS: Lungs are hypoinflated demonstrate interval improvement with moderate residual left-sided pleural effusion likely with associated basilar atelectasis. No pneumothorax. Mild prominence of the perihilar markings without significant change. Cardiomediastinal silhouette and remainder of the exam is unchanged. IMPRESSION: Hypoinflation with interval improvement in left pleural effusion post thoracentesis. No pneumothorax. Moderate residual left effusion likely with associated basilar atelectasis. Prominence of the perihilar markings suggesting vascular congestion. Electronically Signed   By: Marin Olp M.D.   On: 11/15/2018 13:58   Dg Chest 2 View  Result Date: 11/14/2018 CLINICAL DATA:  Stage IV breast cancer.  Shortness of  breath. EXAM: CHEST - 2 VIEW COMPARISON:  Radiographs 11/13/2018 and 10/29/2018.  CT 10/26/2018. FINDINGS: The heart size and mediastinal contours are stable. Left pleural effusion and left basilar opacity are unchanged from the recent study. Diffusely increased interstitial markings are again noted throughout both lungs, remaining suspicious for lymphangitic spread of tumor based on previous CT. No progressive airspace opacities identified. Diffuse osseous metastatic disease noted without evidence of acute fracture. IMPRESSION: Stable radiographic appearance of the chest since yesterday. A left pleural effusion and left basilar pulmonary opacity have increased from older  prior studies. Evidence of extensive metastatic disease again noted. Electronically Signed   By: Richardean Sale M.D.   On: 11/14/2018 17:10   Dg Chest 2 View  Result Date: 11/13/2018 CLINICAL DATA:  Shortness of breath, weakness. Cancer patient. EXAM: CHEST - 2 VIEW COMPARISON:  Chest x-ray dated 10/29/2018. Chest CT dated 10/26/2018. FINDINGS: Heart size and mediastinal contours appear stable. Increased opacity at the LEFT lung base, likely increased pleural effusion. There is also central pulmonary vascular congestion and bilateral perihilar edema. No pneumothorax seen. Diffuse osseous metastases, as described on previous CT report. IMPRESSION: 1. Increased opacity at the LEFT lung base, likely increased pleural effusion. Superimposed pneumonia or airspace collapse not excluded. 2. Central pulmonary vascular congestion and bilateral perihilar edema indicating CHF/volume overload. 3. Diffuse osseous metastases. Electronically Signed   By: Franki Cabot M.D.   On: 11/13/2018 12:32   Ct Chest Wo Contrast  Result Date: 11/17/2018 CLINICAL DATA:  58 year old female Caucasian female nonsmoker with history of bilateral lower extremity edema. Possible stage IV breast cancer. EXAM: CT CHEST WITHOUT CONTRAST TECHNIQUE: Multidetector CT imaging of the chest was performed following the standard protocol without IV contrast. COMPARISON:  Chest CT 10/26/2018. FINDINGS: Cardiovascular: Heart size is normal. There is no significant pericardial fluid, thickening or pericardial calcification. There is aortic atherosclerosis, as well as atherosclerosis of the great vessels of the mediastinum and the coronary arteries, including calcified atherosclerotic plaque in the left anterior descending and right coronary arteries. Mediastinum/Nodes: No pathologically enlarged mediastinal or hilar lymph nodes. Please note that accurate exclusion of hilar adenopathy is limited on noncontrast CT scans. Esophagus is unremarkable in  appearance. Multiple borderline enlarged and mildly enlarged bilateral axillary and subpectoral lymph nodes (right greater than left) measuring up to 15 mm in short axis on the right (axial image 49 of series 2). Lungs/Pleura: Extensive septal thickening with band like opacities throughout both lungs, similar to the prior study. No discrete pulmonary nodules or masses are confidently identified. Moderate left and small right pleural effusions. The left pleural effusion is predominantly sub pulmonic in position. Upper Abdomen: Several peripherally calcified gallstones lying dependently in the gallbladder measuring up to 1.9 cm in diameter. Aortic atherosclerosis. Musculoskeletal: Right breast is incompletely imaged, but there appears to be irregular soft tissue infiltration throughout much of the right breast tissue, which could reflect tumoral in for tray shin and/or edema. Diffuse lytic and sclerotic lesions throughout the visualized axial and appendicular skeleton, indicative of widespread metastatic disease to the bones. With appears very similar to the prior examination. There continue to be multiple nondisplaced to minimally displaced pathologic fractures throughout the ribs bilaterally. In addition, there is a new widely displaced fracture of the medial aspect of the right clavicle with approximately 1.4 cm of lateral and anterior displacement, as well as high attenuation thickening of the adjacent musculature in the upper right pectoralis major and lower right sternocleidomastoid muscles, most compatible with intramuscular hematoma  and/or tumor. IMPRESSION: 1. Widespread metastatic disease to the bones redemonstrated, with multiple nondisplaced minimally displaced bilateral rib fractures, as well as a new widely displaced fracture of the medial aspect of the right clavicle with probable adjacent intramuscular hemorrhage in the lower right sternocleidomastoid muscle and upper right pectoralis major muscle.  Probable tumoral infiltration throughout the right breast, as well as bilateral axillary and subpectoral lymphadenopathy (right greater than left). 2. Unusual appearance of the lungs, similar to the prior study, which could suggest chronic systemic disease such as sarcoidosis or interstitial lung disease. The possibility of lymphangitic spread of tumor in the lungs could be considered, but is not strongly favored at this time. 3. Moderate left (predominantly subpulmonic) pleural effusion and small right pleural effusion. 4. Cholelithiasis. 5. Aortic atherosclerosis, in addition to 2 vessel coronary artery disease. Aortic Atherosclerosis (ICD10-I70.0). Electronically Signed   By: Vinnie Langton M.D.   On: 11/17/2018 14:55   Dg Chest Port 1 View  Result Date: 11/20/2018 CLINICAL DATA:  Hypoxia, metastatic breast cancer EXAM: PORTABLE CHEST 1 VIEW COMPARISON:  Portable exam 1200 hours compared to 11/19/2018 FINDINGS: Very low lung volumes. Stable heart size mediastinal contours. BILATERAL pulmonary opacities again identified, unchanged. No new infiltrate, pleural effusion or pneumothorax. Scattered areas of osseous sclerosis consistent with osseous metastatic disease IMPRESSION: Widespread osseous metastatic disease. Persistent pulmonary opacities. Electronically Signed   By: Lavonia Dana M.D.   On: 11/20/2018 14:06   Dg Chest Port 1 View  Result Date: 11/19/2018 CLINICAL DATA:  Shortness of breath EXAM: PORTABLE CHEST 1 VIEW COMPARISON:  Yesterday FINDINGS: Very low volume chest with patchy bilateral infiltrate greater on the right. Some of these are streaky and suggestive of scarring or atelectasis. No visible effusion or pneumothorax. Largely obscured heart which is not enlarged by recent CT. Extensive osseous metastatic disease IMPRESSION: Stable low volume chest with atelectasis and interstitial opacity, reference chest CT from 2 days ago. Electronically Signed   By: Monte Fantasia M.D.   On: 11/19/2018  08:54   Dg Chest Port 1 View  Result Date: 11/18/2018 CLINICAL DATA:  Shortness of breath and increased weakness this morning; history metastatic breast cancer EXAM: PORTABLE CHEST 1 VIEW COMPARISON:  Portable exam 0823 hours compared to 11/17/2018 FINDINGS: Very low lung volumes. Normal heart size mediastinal contours. Patchy opacities in the lungs bilaterally favoring pneumonia over atelectasis. No pleural effusion or pneumothorax. Patchy osteosclerosis consistent with osseous metastatic disease. IMPRESSION: BILATERAL pulmonary opacities likely representing multifocal pneumonia. Electronically Signed   By: Lavonia Dana M.D.   On: 11/18/2018 09:30   Dg Chest Port 1 View  Result Date: 11/17/2018 CLINICAL DATA:  Shortness of breath.  Metastatic breast cancer. EXAM: PORTABLE CHEST 1 VIEW COMPARISON:  11/16/2018.  CT 10/26/2018. FINDINGS: Mediastinum stable. Heart size normal. Persistent left base atelectasis infiltrate. Right base atelectasis/infiltrate also noted on today's exam. Diffuse bilateral interstitial prominence again noted. Tiny left pleural effusion again noted. No pneumothorax. Multiple lytic lesions noted throughout the visualized bony structures. IMPRESSION: 1. Persistent left base atelectasis/infiltrate. Right base atelectasis/infiltrate also noted on today's exam. Small left pleural effusion again noted. 2. Diffuse bilateral interstitial prominence again noted consistent with interstitial tumor spread this patient with known metastatic disease. 3. Multiple lytic lesions are again noted throughout the visualized bony structures consistent with metastatic disease. Electronically Signed   By: Marcello Moores  Register   On: 11/17/2018 06:24   Dg Chest Port 1 View  Result Date: 11/16/2018 CLINICAL DATA:  Dyspnea, metastatic breast cancer EXAM:  PORTABLE CHEST 1 VIEW COMPARISON:  Chest radiograph from one day prior. FINDINGS: Low lung volumes. Stable cardiomediastinal silhouette with mild cardiomegaly. No  pneumothorax. Stable small bilateral pleural effusions. Stable patchy left lung base consolidation. Stable thick irregular reticular opacities in the upper lungs. IMPRESSION: 1. Stable low lung volumes with small bilateral pleural effusions. 2. Stable thick irregular reticular opacities in the upper lungs and patchy left lung base consolidation. Findings likely representing combination of lymphangitic tumor (as seen on recent chest CT) and atelectasis. A component of mild pulmonary edema cannot be excluded. Electronically Signed   By: Ilona Sorrel M.D.   On: 11/16/2018 07:13   Dg Abd 2 Views  Result Date: 11/13/2018 CLINICAL DATA:  Pt c/o sob, weakness, cancer patient. EXAM: ABDOMEN - 2 VIEW COMPARISON:  None. FINDINGS: Overall visualized bowel gas pattern is nonobstructive. Moderate amount of stool and gas throughout the colon. No evidence of free intraperitoneal air seen. Diffuse osseous metastases, as previously described. IMPRESSION: 1. Nonobstructive bowel gas pattern. Moderate amount of stool and gas throughout the colon. 2. Diffuse osseous metastases. Electronically Signed   By: Franki Cabot M.D.   On: 11/13/2018 12:33   Vas [place] Lower Extremity Venous (dvt)  Result Date: 11/18/2018  Lower Venous Study Indications: Swelling.  Risk Factors: Cancer. Limitations: Body habitus and poor ultrasound/tissue interface. Comparison Study: No prior studies. Performing Technologist: Oliver Hum RVT  Examination Guidelines: A complete evaluation includes B-mode imaging, spectral Doppler, color Doppler, and power Doppler as needed of all accessible portions of each vessel. Bilateral testing is considered an integral part of a complete examination. Limited examinations for reoccurring indications may be performed as noted.  +---------+---------------+---------+-----------+----------+-------+ RIGHT    CompressibilityPhasicitySpontaneityPropertiesSummary  +---------+---------------+---------+-----------+----------+-------+ CFV      Full           Yes      Yes                          +---------+---------------+---------+-----------+----------+-------+ SFJ      Full                                                 +---------+---------------+---------+-----------+----------+-------+ FV Prox  Full                                                 +---------+---------------+---------+-----------+----------+-------+ FV Mid   Full                                                 +---------+---------------+---------+-----------+----------+-------+ FV DistalFull                                                 +---------+---------------+---------+-----------+----------+-------+ PFV      Full                                                 +---------+---------------+---------+-----------+----------+-------+  POP      Full           Yes      Yes                          +---------+---------------+---------+-----------+----------+-------+ PTV      Full                                                 +---------+---------------+---------+-----------+----------+-------+ PERO     Full                                                 +---------+---------------+---------+-----------+----------+-------+   +---------+---------------+---------+-----------+----------+-------+ LEFT     CompressibilityPhasicitySpontaneityPropertiesSummary +---------+---------------+---------+-----------+----------+-------+ CFV      Full           Yes      Yes                          +---------+---------------+---------+-----------+----------+-------+ SFJ      Full                                                 +---------+---------------+---------+-----------+----------+-------+ FV Prox  Full                                                 +---------+---------------+---------+-----------+----------+-------+ FV Mid   Full                                                  +---------+---------------+---------+-----------+----------+-------+ FV DistalFull                                                 +---------+---------------+---------+-----------+----------+-------+ PFV      Full                                                 +---------+---------------+---------+-----------+----------+-------+ POP      Full           Yes      Yes                          +---------+---------------+---------+-----------+----------+-------+ PTV      Full                                                 +---------+---------------+---------+-----------+----------+-------+ PERO  Full                                                 +---------+---------------+---------+-----------+----------+-------+     Summary: Right: There is no evidence of deep vein thrombosis in the lower extremity. No cystic structure found in the popliteal fossa. Left: There is no evidence of deep vein thrombosis in the lower extremity. No cystic structure found in the popliteal fossa.  *See table(s) above for measurements and observations. Electronically signed by Deitra Mayo MD on 11/18/2018 at 11:42:50 AM.    Final    US Thoracentesis Asp Pleural Space W/img Guide  Result Date: 11/15/2018 INDICATION: Patient with history of metastatic breast cancer, dyspnea, bilateral pleural effusions L>R. Request is made for therapeutic left thoracentesis. EXAM: ULTRASOUND GUIDED THERAPEUTIC LEFT THORACENTESIS MEDICATIONS: 10 mL 1% lidocaine COMPLICATIONS: None immediate. PROCEDURE: An ultrasound guided thoracentesis was thoroughly discussed with the patient and questions answered. The benefits, risks, alternatives and complications were also discussed. The patient understands and wishes to proceed with the procedure. Written consent was obtained. Ultrasound was performed to localize and mark an adequate pocket of fluid in the left chest. The  area was then prepped and draped in the normal sterile fashion. 1% Lidocaine was used for local anesthesia. Under ultrasound guidance a 6 Fr Safe-T-Centesis catheter was introduced. Thoracentesis was performed. The catheter was removed and a dressing applied. FINDINGS: A total of approximately 400 mL of dark red fluid was removed. IMPRESSION: Successful ultrasound guided left thoracentesis yielding 400 mL of pleural fluid. Read by: Earley Abide, PA-C Electronically Signed   By: Markus Daft M.D.   On: 11/15/2018 14:32      LAB RESULTS: Basic Metabolic Panel: Recent Labs  Lab 11/26/18 0514  11/29/18 0505 11/30/18 0536  NA 133*   < > 135 133*  K 4.2   < > 3.7 3.3*  CL 92*   < > 93* 93*  CO2 30   < > 32 30  GLUCOSE 132*   < > 126* 156*  BUN 23*   < > 15 16  CREATININE 0.90   < > 0.91 0.82  CALCIUM 6.6*   < > 7.0* 6.5*  MG 2.1  --   --   --    < > = values in this interval not displayed.   Liver Function Tests: Recent Labs  Lab 11/27/18 0537 11/29/18 0505  AST 30 24  ALT 30 29  ALKPHOS 201* 203*  BILITOT 0.6 0.6  PROT 7.0 7.1  ALBUMIN 2.5* 2.5*   No results for input(s): LIPASE, AMYLASE in the last 168 hours. No results for input(s): AMMONIA in the last 168 hours. CBC: Recent Labs  Lab 11/26/18 0514  11/28/18 0459 11/29/18 0505  WBC 10.3   < > 8.4 6.8  NEUTROABS 6.0  --   --   --   HGB 7.8*   < > 6.9* 8.1*  HCT 25.7*   < > 22.3* 26.4*  MCV 103.6*   < > 103.7* 100.0  PLT 129*   < > 187 189   < > = values in this interval not displayed.   Cardiac Enzymes: No results for input(s): CKTOTAL, CKMB, CKMBINDEX, TROPONINI in the last 168 hours. BNP: Invalid input(s): POCBNP CBG: No results for input(s): GLUCAP in the last 168 hours.    Disposition and Follow-up:  Discharge Instructions    (HEART FAILURE PATIENTS) Call MD:  Anytime you have any of the following symptoms: 1) 3 pound weight gain in 24 hours or 5 pounds in 1 week 2) shortness of breath, with or without a  dry hacking cough 3) swelling in the hands, feet or stomach 4) if you have to sleep on extra pillows at night in order to breathe.   Complete by: As directed    Diet - low sodium heart healthy   Complete by: As directed    Increase activity slowly   Complete by: As directed        DISPOSITION: Home with home health PT OT   [place]    [name], Virgie Dad, MD. Schedule an appointment as soon as possible for a visit in 1 week(s).   Specialty: Oncology Why: please check CBC and BMET Contact information: [place] [address] [phone]         [facility]. Schedule an appointment as soon as possible for a visit in 1 week(s).   Why: for hospital follow-up Contact information: [address] [place] [address] [phone]           Time coordinating discharge:  35 minutes  Signed:   Estill Cotta M.D. [facility] 12/01/2018, 12:17 PM

## 2018-11-30 NOTE — Progress Notes (Signed)
[facility]                                                                              Patient Demographics  Suzanne [name], is a 58 y.o. 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  Patient admitted underwent thoracentesis of the right and left lungs on 6/2 and 6/3 and scheduled for repeat 3 days later. Patient was admitted respiratory status worsened and patient placed on the BiPAP and transferred to the stepdown unit.  Patient also placed on IV diuretics.  Patient noted to spike a fever and as such also placed on IV antibiotics for presumed aspiration pneumonia.  Due to tenuous respiratory status critical care consulted. Patient underwent left-sided thoracentesis on 11/15/2018 with 400 cc of back red pleural fluid removed.  Cytology from thoracentesis of 10/28/2018+ for malignant cells consistent with breast cancer.  COVID-19 negative.  IV antibiotics narrowed down to oral Augmentin starting 11/19/2018.  Lower extremity Dopplers which were done were negative.   Palliative care consulted for goals of care.  Oncology following as well.   Assessment & Plan    Principal Problem:   Acute on chronic respiratory failure with hypoxia (HCC) likely multifactorial secondary to malignant pleural  effusions, metastatic stage IV breast CA, possible aspiration pneumonia, volume overload -Status post thoracentesis on 6/28 on the left with 400 cc of dark red pleural fluid removed -CT chest on 6/22 had shown widespread metastatic disease to the bones, multiple bilateral rib fractures, medial aspect of right clavicle fracture, probable adjacent intramuscular hemorrhage on the right lower sternocleidomastoid muscle and upper right pectoralis major muscle, probable tumor infiltration throughout the right breast as well as bilateral axillary and subpectoral lymphadenopathy.  Possibility of lymphangitic spread of the tumor in the lungs. -Post thoracentesis, patient had increased O2 requirements, and needed BiPAP and diuresis.  -Patient was placed on IV Lasix with good diuresis, currently transitioned to oral Lasix -COVID-19 test negative, repeat COVID-19 test negative Home O2 evaluation done, needs 2 L O2.  Currently O2 sats 94% on 2 L  Active Problems: Hypocalcemia -Likely secondary to recent Xgeva injection on 6/21. -Continue calcium replacement 3 times daily, will need repeat bmet and calcium in 1 week  Malignant pleural effusion/loculated pleural effusion in the setting of right-sided inflammatory stage IV breast CA, mets involving lungs, lymph nodes, bones -Status post thoracentesis, cytology positive for malignant cells consistent with breast cancer. -Patient may require  Pleurx catheter if recurrence of effusion however not at this time per CCM. -Oncology, palliative care following, continue arimidex -Oncology started patient on palbociclib on 6/30.  Probable aspiration pneumonia -Patient was initially placed on IV antibiotics empirically, transitioned to oral Augmentin, patient has completed course of antibiotics  Microcytic anemia -H&H stable, received 1 unit packed RBCs on 7/3 for hemoglobin of 6.9, tolerated well.    Volume overload, suspected acute diastolic CHF in the setting of  blood transfusion - 2D echo 6/1 had shown EF of 60 to 33% with diastolic parameters indeterminate. -Continue Lasix 40 mg twice a day, outpatient labs in 1 week  Sinus tachycardia Resolved  Diarrhea Resolved  Code Status: Full code DVT Prophylaxis:  Lovenox  Family Communication: Discussed in detail with the patient, all imaging results, lab results explained to the patient   Disposition Plan:   Plan is to discharge home today.  Case management assisting on getting hospital bed and home O2 delivered from home care agency today.  Once all set up, patient will be discharged home with home health.    Time Spent in minutes 25 minutes  Procedures:   6/02 Right Pleural Fluid Cytology >>positive for malignant cells c/w breast cancer  6/20 Left Thoracentesis >>400 ml dark red pleural fluid removed  11/17/2018 lower extremity Dopplers CT chest 11/17/2018   Consultants:     PCCM: Dr. Carson Myrtle   Palliative care: Dr. Rowe Pavy  Allergy: Dr. Jana Hakim   Antimicrobials:   Anti-infectives (From admission, onward)   Start     Dose/Rate Route Frequency Ordered Stop   11/19/18 2200  amoxicillin-clavulanate (AUGMENTIN) 875-125 MG per tablet 1 tablet     1 tablet Oral Every 12 hours 11/19/18 0833 11/22/18 1008   11/17/18 0600  piperacillin-tazobactam (ZOSYN) IVPB 3.375 g     3.375 g 12.5 mL/hr over 240 Minutes Intravenous Every 8 hours 11/16/18 2235 11/19/18 1833   11/17/18 0000  piperacillin-tazobactam (ZOSYN) IVPB 3.375 g  Status:  Discontinued     3.375 g 100 mL/hr over 30 Minutes Intravenous Every 6 hours 11/16/18 2231 11/16/18 2235   11/16/18 0900  ceFEPIme (MAXIPIME) 1 g in sodium chloride 0.9 % 100 mL IVPB  Status:  Discontinued     1 g 200 mL/hr over 30 Minutes Intravenous Every 8 hours 11/16/18 0815 11/16/18 2231         Medications  Scheduled Meds:  anastrozole  1 mg Oral Daily   calcium-vitamin D  1 tablet Oral TID   carvedilol  6.25 mg Oral BID WC   enoxaparin  (LOVENOX) injection  40 mg Subcutaneous Daily   feeding supplement (ENSURE ENLIVE)  237 mL Oral TID BM   furosemide  40 mg Oral BID   loratadine  10 mg Oral Daily   mouth rinse  15 mL Mouth Rinse q12n4p   nystatin   Topical TID   palbociclib  125 mg Oral Q breakfast   senna-docusate  1 tablet Oral QHS   Continuous Infusions:  sodium chloride Stopped (11/16/18 1407)   PRN Meds:.sodium chloride, acetaminophen **OR** acetaminophen, bisacodyl, clotrimazole, diphenhydrAMINE, HYDROcodone-homatropine, levalbuterol, loperamide, LORazepam, morphine injection, ondansetron **OR** ondansetron (ZOFRAN) IV, oxyCODONE, polyethylene glycol      Subjective:   Suzanne [name] was seen and examined today.  No complaints, patient desperately wants to go home, awaiting DME's to be delivered home by the home care agency.  No chest pain.  Shortness of breath is improving.  No fevers or chills.   Patient denies dizziness, abdominal  pain, N/V new weakness, numbess, tingling. .    Objective:   Vitals:   11/29/18 2012 11/30/18 0349 11/30/18 0407 11/30/18 1251  BP: (!) 107/56  128/67 (!) 147/122  Pulse: 90  94 89  Resp: 18  18 20   Temp: (!) 100.5 F (38.1 C)  98.3 F (36.8 C) 98.3 F (36.8 C)  TempSrc: Oral  Oral Oral  SpO2: 97%  96% 94%  Weight:  87 kg    Height:        Intake/Output Summary (Last 24 hours) at 11/30/2018 1706 Last data filed at 11/30/2018 0719 Gross per 24 hour  Intake 0 ml  Output 300 ml  Net -300 ml     Wt Readings from Last 3 Encounters:  11/30/18 87 kg  10/30/18 106.2 kg  02/21/11 106.6 kg   Physical Exam  General: Alert and oriented x 3, NAD  Eyes:  HEENT:   Cardiovascular: S1 S2 clear, no murmurs, RRR. No pedal edema b/l  Respiratory: Diminished breath sound at the bases, no wheezing   Gastrointestinal: Soft, nontender, nondistended, NBS  Ext: no pedal edema bilaterally  Neuro: no new deficits  Musculoskeletal: No cyanosis, clubbing  Skin: No  rashes  Psych: Normal affect and demeanor, alert and oriented x3     Data Reviewed:  I have personally reviewed following labs and imaging studies  Micro Results Recent Results (from the past 240 hour(s))  Novel Coronavirus, NAA (hospital order; send-out to ref lab)     Status: None   Collection Time: 11/25/18  2:58 PM   Specimen: Nasopharyngeal Swab; Respiratory  Result Value Ref Range Status   SARS-CoV-2, NAA NOT DETECTED NOT DETECTED Final    Comment: (NOTE) This test was developed and its performance characteristics determined by [employer]. This test has not been FDA cleared or approved. This test has been authorized by FDA under an Emergency Use Authorization (EUA). This test is only authorized for the duration of time the declaration that circumstances exist justifying the authorization of the emergency use of in vitro diagnostic tests for detection of SARS-CoV-2 virus and/or diagnosis of COVID-19 infection under section 564(b)(1) of the Act, 21 U.S.C. 382NKN-3(Z)(7), unless the authorization is terminated or revoked sooner. When diagnostic testing is negative, the possibility of a false negative result should be considered in the context of a patient's recent exposures and the presence of clinical signs and symptoms consistent with COVID-19. An individual without symptoms of COVID-19 and who is not shedding SARS-CoV-2 virus would expect to have a negative (not detected) result in this assay. Performed  At: [facility] [place], [place] [id number] Rush Farmer MD KW:[id number]    [place]  Final    Comment: Performed at [facility] [address] [place], [place] [address]    Radiology Reports Dg Chest 1 View  Result Date: 11/15/2018 CLINICAL DATA:  Post left-sided thoracentesis. EXAM: CHEST  1 VIEW COMPARISON:  11/14/2018 FINDINGS: Lungs are hypoinflated demonstrate interval  improvement with moderate residual left-sided pleural effusion likely with associated basilar atelectasis. No pneumothorax. Mild prominence of the perihilar markings without significant change. Cardiomediastinal silhouette and remainder of the exam is unchanged. IMPRESSION: Hypoinflation with interval improvement in left pleural effusion post thoracentesis. No pneumothorax. Moderate residual left effusion likely with associated basilar atelectasis. Prominence of the perihilar markings suggesting vascular congestion. Electronically Signed   By: Marin Olp M.D.   On: 11/15/2018 13:58   Dg Chest 2 View  Result Date:  11/14/2018 CLINICAL DATA:  Stage IV breast cancer.  Shortness of breath. EXAM: CHEST - 2 VIEW COMPARISON:  Radiographs 11/13/2018 and 10/29/2018.  CT 10/26/2018. FINDINGS: The heart size and mediastinal contours are stable. Left pleural effusion and left basilar opacity are unchanged from the recent study. Diffusely increased interstitial markings are again noted throughout both lungs, remaining suspicious for lymphangitic spread of tumor based on previous CT. No progressive airspace opacities identified. Diffuse osseous metastatic disease noted without evidence of acute fracture. IMPRESSION: Stable radiographic appearance of the chest since yesterday. A left pleural effusion and left basilar pulmonary opacity have increased from older prior studies. Evidence of extensive metastatic disease again noted. Electronically Signed   By: Richardean Sale M.D.   On: 11/14/2018 17:10   Dg Chest 2 View  Result Date: 11/13/2018 CLINICAL DATA:  Shortness of breath, weakness. Cancer patient. EXAM: CHEST - 2 VIEW COMPARISON:  Chest x-ray dated 10/29/2018. Chest CT dated 10/26/2018. FINDINGS: Heart size and mediastinal contours appear stable. Increased opacity at the LEFT lung base, likely increased pleural effusion. There is also central pulmonary vascular congestion and bilateral perihilar edema. No  pneumothorax seen. Diffuse osseous metastases, as described on previous CT report. IMPRESSION: 1. Increased opacity at the LEFT lung base, likely increased pleural effusion. Superimposed pneumonia or airspace collapse not excluded. 2. Central pulmonary vascular congestion and bilateral perihilar edema indicating CHF/volume overload. 3. Diffuse osseous metastases. Electronically Signed   By: Franki Cabot M.D.   On: 11/13/2018 12:32   Ct Chest Wo Contrast  Result Date: 11/17/2018 CLINICAL DATA:  58 year old female Caucasian female nonsmoker with history of bilateral lower extremity edema. Possible stage IV breast cancer. EXAM: CT CHEST WITHOUT CONTRAST TECHNIQUE: Multidetector CT imaging of the chest was performed following the standard protocol without IV contrast. COMPARISON:  Chest CT 10/26/2018. FINDINGS: Cardiovascular: Heart size is normal. There is no significant pericardial fluid, thickening or pericardial calcification. There is aortic atherosclerosis, as well as atherosclerosis of the great vessels of the mediastinum and the coronary arteries, including calcified atherosclerotic plaque in the left anterior descending and right coronary arteries. Mediastinum/Nodes: No pathologically enlarged mediastinal or hilar lymph nodes. Please note that accurate exclusion of hilar adenopathy is limited on noncontrast CT scans. Esophagus is unremarkable in appearance. Multiple borderline enlarged and mildly enlarged bilateral axillary and subpectoral lymph nodes (right greater than left) measuring up to 15 mm in short axis on the right (axial image 49 of series 2). Lungs/Pleura: Extensive septal thickening with band like opacities throughout both lungs, similar to the prior study. No discrete pulmonary nodules or masses are confidently identified. Moderate left and small right pleural effusions. The left pleural effusion is predominantly sub pulmonic in position. Upper Abdomen: Several peripherally calcified  gallstones lying dependently in the gallbladder measuring up to 1.9 cm in diameter. Aortic atherosclerosis. Musculoskeletal: Right breast is incompletely imaged, but there appears to be irregular soft tissue infiltration throughout much of the right breast tissue, which could reflect tumoral in for tray shin and/or edema. Diffuse lytic and sclerotic lesions throughout the visualized axial and appendicular skeleton, indicative of widespread metastatic disease to the bones. With appears very similar to the prior examination. There continue to be multiple nondisplaced to minimally displaced pathologic fractures throughout the ribs bilaterally. In addition, there is a new widely displaced fracture of the medial aspect of the right clavicle with approximately 1.4 cm of lateral and anterior displacement, as well as high attenuation thickening of the adjacent musculature in the upper right pectoralis  major and lower right sternocleidomastoid muscles, most compatible with intramuscular hematoma and/or tumor. IMPRESSION: 1. Widespread metastatic disease to the bones redemonstrated, with multiple nondisplaced minimally displaced bilateral rib fractures, as well as a new widely displaced fracture of the medial aspect of the right clavicle with probable adjacent intramuscular hemorrhage in the lower right sternocleidomastoid muscle and upper right pectoralis major muscle. Probable tumoral infiltration throughout the right breast, as well as bilateral axillary and subpectoral lymphadenopathy (right greater than left). 2. Unusual appearance of the lungs, similar to the prior study, which could suggest chronic systemic disease such as sarcoidosis or interstitial lung disease. The possibility of lymphangitic spread of tumor in the lungs could be considered, but is not strongly favored at this time. 3. Moderate left (predominantly subpulmonic) pleural effusion and small right pleural effusion. 4. Cholelithiasis. 5. Aortic  atherosclerosis, in addition to 2 vessel coronary artery disease. Aortic Atherosclerosis (ICD10-I70.0). Electronically Signed   By: Vinnie Langton M.D.   On: 11/17/2018 14:55   Dg Chest Port 1 View  Result Date: 11/20/2018 CLINICAL DATA:  Hypoxia, metastatic breast cancer EXAM: PORTABLE CHEST 1 VIEW COMPARISON:  Portable exam 1200 hours compared to 11/19/2018 FINDINGS: Very low lung volumes. Stable heart size mediastinal contours. BILATERAL pulmonary opacities again identified, unchanged. No new infiltrate, pleural effusion or pneumothorax. Scattered areas of osseous sclerosis consistent with osseous metastatic disease IMPRESSION: Widespread osseous metastatic disease. Persistent pulmonary opacities. Electronically Signed   By: Lavonia Dana M.D.   On: 11/20/2018 14:06   Dg Chest Port 1 View  Result Date: 11/19/2018 CLINICAL DATA:  Shortness of breath EXAM: PORTABLE CHEST 1 VIEW COMPARISON:  Yesterday FINDINGS: Very low volume chest with patchy bilateral infiltrate greater on the right. Some of these are streaky and suggestive of scarring or atelectasis. No visible effusion or pneumothorax. Largely obscured heart which is not enlarged by recent CT. Extensive osseous metastatic disease IMPRESSION: Stable low volume chest with atelectasis and interstitial opacity, reference chest CT from 2 days ago. Electronically Signed   By: Monte Fantasia M.D.   On: 11/19/2018 08:54   Dg Chest Port 1 View  Result Date: 11/18/2018 CLINICAL DATA:  Shortness of breath and increased weakness this morning; history metastatic breast cancer EXAM: PORTABLE CHEST 1 VIEW COMPARISON:  Portable exam 0823 hours compared to 11/17/2018 FINDINGS: Very low lung volumes. Normal heart size mediastinal contours. Patchy opacities in the lungs bilaterally favoring pneumonia over atelectasis. No pleural effusion or pneumothorax. Patchy osteosclerosis consistent with osseous metastatic disease. IMPRESSION: BILATERAL pulmonary opacities likely  representing multifocal pneumonia. Electronically Signed   By: Lavonia Dana M.D.   On: 11/18/2018 09:30   Dg Chest Port 1 View  Result Date: 11/17/2018 CLINICAL DATA:  Shortness of breath.  Metastatic breast cancer. EXAM: PORTABLE CHEST 1 VIEW COMPARISON:  11/16/2018.  CT 10/26/2018. FINDINGS: Mediastinum stable. Heart size normal. Persistent left base atelectasis infiltrate. Right base atelectasis/infiltrate also noted on today's exam. Diffuse bilateral interstitial prominence again noted. Tiny left pleural effusion again noted. No pneumothorax. Multiple lytic lesions noted throughout the visualized bony structures. IMPRESSION: 1. Persistent left base atelectasis/infiltrate. Right base atelectasis/infiltrate also noted on today's exam. Small left pleural effusion again noted. 2. Diffuse bilateral interstitial prominence again noted consistent with interstitial tumor spread this patient with known metastatic disease. 3. Multiple lytic lesions are again noted throughout the visualized bony structures consistent with metastatic disease. Electronically Signed   By: Marcello Moores  Register   On: 11/17/2018 06:24   Dg Chest Port 1 [address]  Result Date: 11/16/2018 CLINICAL DATA:  Dyspnea, metastatic breast cancer EXAM: PORTABLE CHEST 1 VIEW COMPARISON:  Chest radiograph from one day prior. FINDINGS: Low lung volumes. Stable cardiomediastinal silhouette with mild cardiomegaly. No pneumothorax. Stable small bilateral pleural effusions. Stable patchy left lung base consolidation. Stable thick irregular reticular opacities in the upper lungs. IMPRESSION: 1. Stable low lung volumes with small bilateral pleural effusions. 2. Stable thick irregular reticular opacities in the upper lungs and patchy left lung base consolidation. Findings likely representing combination of lymphangitic tumor (as seen on recent chest CT) and atelectasis. A component of mild pulmonary edema cannot be excluded. Electronically Signed   By: Ilona Sorrel  M.D.   On: 11/16/2018 07:13   Dg Abd 2 Views  Result Date: 11/13/2018 CLINICAL DATA:  Pt c/o sob, weakness, cancer patient. EXAM: ABDOMEN - 2 VIEW COMPARISON:  None. FINDINGS: Overall visualized bowel gas pattern is nonobstructive. Moderate amount of stool and gas throughout the colon. No evidence of free intraperitoneal air seen. Diffuse osseous metastases, as previously described. IMPRESSION: 1. Nonobstructive bowel gas pattern. Moderate amount of stool and gas throughout the colon. 2. Diffuse osseous metastases. Electronically Signed   By: Franki Cabot M.D.   On: 11/13/2018 12:33   Vas [place] Lower Extremity Venous (dvt)  Result Date: 11/18/2018  Lower Venous Study Indications: Swelling.  Risk Factors: Cancer. Limitations: Body habitus and poor ultrasound/tissue interface. Comparison Study: No prior studies. Performing Technologist: Oliver Hum RVT  Examination Guidelines: A complete evaluation includes B-mode imaging, spectral Doppler, color Doppler, and power Doppler as needed of all accessible portions of each vessel. Bilateral testing is considered an integral part of a complete examination. Limited examinations for reoccurring indications may be performed as noted.  +---------+---------------+---------+-----------+----------+-------+  RIGHT     Compressibility Phasicity Spontaneity Properties Summary  +---------+---------------+---------+-----------+----------+-------+  CFV       Full            Yes       Yes                             +---------+---------------+---------+-----------+----------+-------+  SFJ       Full                                                      +---------+---------------+---------+-----------+----------+-------+  FV Prox   Full                                                      +---------+---------------+---------+-----------+----------+-------+  FV Mid    Full                                                       +---------+---------------+---------+-----------+----------+-------+  FV Distal Full                                                      +---------+---------------+---------+-----------+----------+-------+  PFV       Full                                                      +---------+---------------+---------+-----------+----------+-------+  POP       Full            Yes       Yes                             +---------+---------------+---------+-----------+----------+-------+  PTV       Full                                                      +---------+---------------+---------+-----------+----------+-------+  PERO      Full                                                      +---------+---------------+---------+-----------+----------+-------+   +---------+---------------+---------+-----------+----------+-------+  LEFT      Compressibility Phasicity Spontaneity Properties Summary  +---------+---------------+---------+-----------+----------+-------+  CFV       Full            Yes       Yes                             +---------+---------------+---------+-----------+----------+-------+  SFJ       Full                                                      +---------+---------------+---------+-----------+----------+-------+  FV Prox   Full                                                      +---------+---------------+---------+-----------+----------+-------+  FV Mid    Full                                                      +---------+---------------+---------+-----------+----------+-------+  FV Distal Full                                                      +---------+---------------+---------+-----------+----------+-------+  PFV       Full                                                      +---------+---------------+---------+-----------+----------+-------+  POP       Full            Yes       Yes                             +---------+---------------+---------+-----------+----------+-------+  PTV       Full                                                       +---------+---------------+---------+-----------+----------+-------+  PERO      Full                                                      +---------+---------------+---------+-----------+----------+-------+     Summary: Right: There is no evidence of deep vein thrombosis in the lower extremity. No cystic structure found in the popliteal fossa. Left: There is no evidence of deep vein thrombosis in the lower extremity. No cystic structure found in the popliteal fossa.  *See table(s) above for measurements and observations. Electronically signed by Deitra Mayo MD on 11/18/2018 at 11:42:50 AM.    Final    US Thoracentesis Asp Pleural Space W/img Guide  Result Date: 11/15/2018 INDICATION: Patient with history of metastatic breast cancer, dyspnea, bilateral pleural effusions L>R. Request is made for therapeutic left thoracentesis. EXAM: ULTRASOUND GUIDED THERAPEUTIC LEFT THORACENTESIS MEDICATIONS: 10 mL 1% lidocaine COMPLICATIONS: None immediate. PROCEDURE: An ultrasound guided thoracentesis was thoroughly discussed with the patient and questions answered. The benefits, risks, alternatives and complications were also discussed. The patient understands and wishes to proceed with the procedure. Written consent was obtained. Ultrasound was performed to localize and mark an adequate pocket of fluid in the left chest. The area was then prepped and draped in the normal sterile fashion. 1% Lidocaine was used for local anesthesia. Under ultrasound guidance a 6 Fr Safe-T-Centesis catheter was introduced. Thoracentesis was performed. The catheter was removed and a dressing applied. FINDINGS: A total of approximately 400 mL of dark red fluid was removed. IMPRESSION: Successful ultrasound guided left thoracentesis yielding 400 mL of pleural fluid. Read by: Earley Abide, PA-C Electronically Signed   By: Markus Daft M.D.   On: 11/15/2018 14:32    Lab  Data:  CBC: Recent Labs  Lab 11/24/18 0247 11/25/18 0517 11/26/18 0514 11/27/18 0537 11/28/18 0459 11/29/18 0505  WBC 10.4 12.1* 10.3 8.6 8.4 6.8  NEUTROABS 5.9  --  6.0  --   --   --   HGB 8.2* 8.2* 7.8* 7.5* 6.9* 8.1*  HCT 27.4* 26.4* 25.7* 24.9* 22.3* 26.4*  MCV 104.6* 102.3* 103.6* 104.2* 103.7* 100.0  PLT 110* 120* 129* 150 187 831   Basic Metabolic Panel: Recent Labs  Lab 11/25/18 0517 11/26/18 0514 11/27/18 0537 11/28/18 0459 11/29/18 0505 11/30/18 0536  NA 134* 133* 134* 134* 135 133*  K 3.8 4.2 3.9 3.9 3.7 3.3*  CL 92* 92* 94* 95* 93* 93*  CO2 29 30 30 30  32 30  GLUCOSE 139* 132* 138* 133* 126* 156*  BUN 22* 23* 21* 18 15 16   CREATININE 0.80 0.90 0.90 0.90 0.91 0.82  CALCIUM 6.9* 6.6* 6.4* 6.8*  7.0* 6.5*  MG 2.1 2.1  --   --   --   --    GFR: Estimated Creatinine Clearance: 75.7 mL/min (by C-G formula based on SCr of 0.82 mg/dL). Liver Function Tests: Recent Labs  Lab 11/25/18 0517 11/27/18 0537 11/29/18 0505  AST 37 30 24  ALT 31 30 29   ALKPHOS 190* 201* 203*  BILITOT 1.0 0.6 0.6  PROT 7.1 7.0 7.1  ALBUMIN 2.5* 2.5* 2.5*   No results for input(s): LIPASE, AMYLASE in the last 168 hours. No results for input(s): AMMONIA in the last 168 hours. Coagulation Profile: No results for input(s): INR, PROTIME in the last 168 hours. Cardiac Enzymes: No results for input(s): CKTOTAL, CKMB, CKMBINDEX, TROPONINI in the last 168 hours. BNP (last 3 results) No results for input(s): PROBNP in the last 8760 hours. HbA1C: No results for input(s): HGBA1C in the last 72 hours. CBG: No results for input(s): GLUCAP in the last 168 hours. Lipid Profile: No results for input(s): CHOL, HDL, LDLCALC, TRIG, CHOLHDL, LDLDIRECT in the last 72 hours. Thyroid Function Tests: No results for input(s): TSH, T4TOTAL, FREET4, T3FREE, THYROIDAB in the last 72 hours. Anemia Panel: No results for input(s): VITAMINB12, FOLATE, FERRITIN, TIBC, IRON, RETICCTPCT in the last 72  hours. Urine analysis:    Component Value Date/Time   COLORURINE YELLOW 10/27/2018 2346   APPEARANCEUR HAZY (A) 10/27/2018 2346   LABSPEC 1.038 (H) 10/27/2018 2346   PHURINE 5.0 10/27/2018 2346   GLUCOSEU NEGATIVE 10/27/2018 2346   HGBUR NEGATIVE 10/27/2018 2346   BILIRUBINUR NEGATIVE 10/27/2018 2346   KETONESUR 5 (A) 10/27/2018 2346   PROTEINUR NEGATIVE 10/27/2018 2346   NITRITE NEGATIVE 10/27/2018 2346   LEUKOCYTESUR NEGATIVE 10/27/2018 2346     Rune Mendez M.D. [facility] 11/30/2018, 5:05 PM  Pager: [phone] Between 7am to 7pm - call Pager - [phone]  After 7pm go to [email or web address] - password [email or web address]  Call night coverage person covering after 7pm

## 2018-11-30 NOTE — Progress Notes (Signed)
Per pt's daughter, hospital bed was never delivered. Pt and daughter verbalize understanding that pt will stay overnight until we can contact company to get bed delivered. Will continue to monitor.

## 2018-12-01 ENCOUNTER — Telehealth: Payer: Self-pay | Admitting: Oncology

## 2018-12-01 MED ORDER — CARVEDILOL 6.25 MG PO TABS: 6.2500 mg | ORAL_TABLET | Freq: Two times a day (BID) | ORAL | 3 refills | Status: DC

## 2018-12-01 MED ORDER — BENZONATATE 200 MG PO CAPS: 200.0000 mg | ORAL_CAPSULE | Freq: Three times a day (TID) | ORAL | 0 refills | Status: DC | PRN

## 2018-12-01 MED ORDER — ALBUTEROL SULFATE HFA 108 (90 BASE) MCG/ACT IN AERS: 2.0000 | INHALATION_SPRAY | Freq: Four times a day (QID) | RESPIRATORY_TRACT | 3 refills | Status: DC | PRN

## 2018-12-01 NOTE — Progress Notes (Signed)
Per [employer] bed to be delivered today between 11 and 2 pm.

## 2018-12-01 NOTE — Telephone Encounter (Signed)
Scheduled appt per 7/06 sch message - unable to reach pt . Left message with appt date and time   

## 2018-12-01 NOTE — Progress Notes (Signed)
Nutrition Follow-up  DOCUMENTATION CODES:   Obesity unspecified  INTERVENTION:  - continue Ensure Enlive TID, each supplement provides 350 kcal and 20 grams of protein. - continue to encourage PO intakes.    NUTRITION DIAGNOSIS:   Increased nutrient needs related to chronic illness, cancer and cancer related treatments as evidenced by estimated needs. 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 She presented to the ED  for evaluation of SOB. Patient underwent blood transfusion 6/19 and subsequently had SOB, DOE and orthopnea associated with cough. She has a thoracentesis of right then left lungs on 6/2 and 6/3 and was scheduled for a repeat.  Weight has been stable over the past week. Per RN flow sheet, mild BLE edema. Per flow sheet, patient consumed 30% of breakfast and 25% of dinner (total of 329 kcal, 17 grams protein) on 7/3; 30% of breakfast and 25% of lunch (total of 382 kcal, 19 grams protein) on 7/4; 0% of breakfast, 20% of lunch, and 10% of dinner (total of 167 kcal, 10 grams protein) on 7/5. Per review of order, she has been accepting Ensure supplements 50-75% of the time offered.   Per notes: - plan was for d/c home yesterday (7/5), but hospital bed was not delivered by home care agency.  - once bed delivered, patient is being discharged home with home health.     Medications reviewed; 1 tablet Oscal-D TID, 40 mg oral lasix BID, 1 tablet senokot/day. Labs reviewed; Na: 133 mmol/l, K: 3.3 mmol/l, Cl: 93 mmol/l, Ca: 6.5 mg/dl, Alk Phos elevated and slightly up from yesterday.    Diet Order:   Diet Order            Diet - low sodium heart healthy        Diet Heart  Room service appropriate? Yes; Fluid consistency: Thin  Diet effective now              EDUCATION NEEDS:   No education needs have been identified at this time  Skin:  Skin Assessment: Skin Integrity Issues: Skin Integrity Issues:: Stage II Stage II: sacrum  Last BM:  7/2  Height:   Ht Readings from Last 1 Encounters:  11/16/18 '5\' 2"'  (1.575 m)    Weight:   Wt Readings from Last 1 Encounters:  12/01/18 86.7 kg    Ideal Body Weight:  50 kg  BMI:  Body mass index is 34.95 kg/m.  Estimated Nutritional Needs:   Kcal:  3151-7616 kcal  Protein:  120-130 grams  Fluid:  >/= 2 L/day     Jarome Matin, MS, RD, LDN, [facility] Clinical Dietitian Pager # [phone] After hours/weekend pager # [phone]

## 2018-12-01 NOTE — Progress Notes (Signed)
HEMATOLOGY-ONCOLOGY PROGRESS NOTE  SUBJECTIVE: Feels better today. Will discharge home later today. Has been working with PT and feels she is strong enough to climb the steps to get into her home. Breathing stable. Will discharge home with O2.   REVIEW OF SYSTEMS:   Constitutional: Denies fevers, chills Eyes: Denies blurriness of vision Ears, nose, mouth, throat, and face: Denies mucositis or sore throat Respiratory: Shortness of breath improved. Cardiovascular: Denies palpitation, chest discomfort Gastrointestinal:  Denies nausea, heartburn or change in bowel habits Skin: Rash to bilateral feet has resolved. Lymphatics: Denies new lymphadenopathy or easy bruising Neurological:Denies numbness, tingling or new weaknesses Behavioral/Psych: Reports being tearful.   Extremities: Reports that the swelling in her lower extremities has improved. All other systems were reviewed with the patient and are negative.  I have reviewed the past medical history, past surgical history, social history and family history with the patient and they are unchanged from previous note.   PHYSICAL EXAMINATION:  Vitals:   12/01/18 0504 12/01/18 0840  BP: 134/83 119/70  Pulse: 95 94  Resp: 18 20  Temp: 97.8 F (36.6 C) 98.4 F (36.9 C)  SpO2: 92% 96%   Filed Weights   11/29/18 0457 11/30/18 0349 12/01/18 0504  Weight: 193 lb 12.6 oz (87.9 kg) 191 lb 12.8 oz (87 kg) 191 lb 1.6 oz (86.7 kg)    Intake/Output from previous day: 07/05 0701 - 07/06 0700 In: 480 [P.O.:480] Out: 300 [Urine:300]  GENERAL:alert, distress SKIN: No rashes or open areas. EYES: normal, Conjunctiva are pink and non-injected, sclera clear OROPHARYNX:no exudate, no erythema and lips, buccal mucosa, and tongue normal  NECK: supple, thyroid normal size, non-tender, without nodularity LYMPH:  no palpable lymphadenopathy in the cervical, axillary or inguinal LUNGS: Clear HEART: regular rate & rhythm and no murmurs.  No lower  extremity edema. ABDOMEN:abdomen soft, non-tender and normal bowel sounds Musculoskeletal:no cyanosis of digits and no clubbing  NEURO: alert & oriented x 3 with fluent speech, no focal motor/sensory deficits.  LABORATORY DATA:  I have reviewed the data as listed CMP Latest Ref Rng & Units 11/30/2018 11/29/2018 11/28/2018  Glucose 70 - 99 mg/dL 156(H) 126(H) 133(H)  BUN 6 - 20 mg/dL _0 Creatinine 0.44 - 1.00 mg/dL 0.82 0.91 0.90  Sodium 135 - 145 mmol/L 133(L) 135 134(L)  Potassium 3.5 - 5.1 mmol/L 3.3(L) 3.7 3.9  Chloride 98 - 111 mmol/L 93(L) 93(L) 95(L)  CO2 22 - 32 mmol/L 30 32 30  Calcium 8.9 - 10.3 mg/dL 6.5(L) 7.0(L) 6.8(L)  Total Protein 6.5 - 8.1 g/dL - 7.1 -  Total Bilirubin 0.3 - 1.2 mg/dL - 0.6 -  Alkaline Phos 38 - 126 U/L - 203(H) -  AST 15 - 41 U/L - 24 -  ALT 0 - 44 U/L - 29 -    Lab Results  Component Value Date   WBC 6.8 11/29/2018   HGB 8.1 (L) 11/29/2018   HCT 26.4 (L) 11/29/2018   MCV 100.0 11/29/2018   PLT 189 11/29/2018   NEUTROABS 6.0 11/26/2018    Dg Chest 1 View  Result Date: 11/15/2018 CLINICAL DATA:  Post left-sided thoracentesis. EXAM: CHEST  1 VIEW COMPARISON:  11/14/2018 FINDINGS: Lungs are hypoinflated demonstrate interval improvement with moderate residual left-sided pleural effusion likely with associated basilar atelectasis. No pneumothorax. Mild prominence of the perihilar markings without significant change. Cardiomediastinal silhouette and remainder of the exam is unchanged. IMPRESSION: Hypoinflation with interval improvement in left pleural effusion post thoracentesis. No pneumothorax. Moderate  residual left effusion likely with associated basilar atelectasis. Prominence of the perihilar markings suggesting vascular congestion. Electronically Signed   By: Marin Olp M.D.   On: 11/15/2018 13:58   Dg Chest 2 View  Result Date: 11/14/2018 CLINICAL DATA:  Stage IV breast cancer.  Shortness of breath. EXAM: CHEST - 2 VIEW COMPARISON:   Radiographs 11/13/2018 and 10/29/2018.  CT 10/26/2018. FINDINGS: The heart size and mediastinal contours are stable. Left pleural effusion and left basilar opacity are unchanged from the recent study. Diffusely increased interstitial markings are again noted throughout both lungs, remaining suspicious for lymphangitic spread of tumor based on previous CT. No progressive airspace opacities identified. Diffuse osseous metastatic disease noted without evidence of acute fracture. IMPRESSION: Stable radiographic appearance of the chest since yesterday. A left pleural effusion and left basilar pulmonary opacity have increased from older prior studies. Evidence of extensive metastatic disease again noted. Electronically Signed   By: Richardean Sale M.D.   On: 11/14/2018 17:10   Dg Chest 2 View  Result Date: 11/13/2018 CLINICAL DATA:  Shortness of breath, weakness. Cancer patient. EXAM: CHEST - 2 VIEW COMPARISON:  Chest x-ray dated 10/29/2018. Chest CT dated 10/26/2018. FINDINGS: Heart size and mediastinal contours appear stable. Increased opacity at the LEFT lung base, likely increased pleural effusion. There is also central pulmonary vascular congestion and bilateral perihilar edema. No pneumothorax seen. Diffuse osseous metastases, as described on previous CT report. IMPRESSION: 1. Increased opacity at the LEFT lung base, likely increased pleural effusion. Superimposed pneumonia or airspace collapse not excluded. 2. Central pulmonary vascular congestion and bilateral perihilar edema indicating CHF/volume overload. 3. Diffuse osseous metastases. Electronically Signed   By: Franki Cabot M.D.   On: 11/13/2018 12:32   Ct Chest Wo Contrast  Result Date: 11/17/2018 CLINICAL DATA:  58 year old female Caucasian female nonsmoker with history of bilateral lower extremity edema. Possible stage IV breast cancer. EXAM: CT CHEST WITHOUT CONTRAST TECHNIQUE: Multidetector CT imaging of the chest was performed following the  standard protocol without IV contrast. COMPARISON:  Chest CT 10/26/2018. FINDINGS: Cardiovascular: Heart size is normal. There is no significant pericardial fluid, thickening or pericardial calcification. There is aortic atherosclerosis, as well as atherosclerosis of the great vessels of the mediastinum and the coronary arteries, including calcified atherosclerotic plaque in the left anterior descending and right coronary arteries. Mediastinum/Nodes: No pathologically enlarged mediastinal or hilar lymph nodes. Please note that accurate exclusion of hilar adenopathy is limited on noncontrast CT scans. Esophagus is unremarkable in appearance. Multiple borderline enlarged and mildly enlarged bilateral axillary and subpectoral lymph nodes (right greater than left) measuring up to 15 mm in short axis on the right (axial image 49 of series 2). Lungs/Pleura: Extensive septal thickening with band like opacities throughout both lungs, similar to the prior study. No discrete pulmonary nodules or masses are confidently identified. Moderate left and small right pleural effusions. The left pleural effusion is predominantly sub pulmonic in position. Upper Abdomen: Several peripherally calcified gallstones lying dependently in the gallbladder measuring up to 1.9 cm in diameter. Aortic atherosclerosis. Musculoskeletal: Right breast is incompletely imaged, but there appears to be irregular soft tissue infiltration throughout much of the right breast tissue, which could reflect tumoral in for tray shin and/or edema. Diffuse lytic and sclerotic lesions throughout the visualized axial and appendicular skeleton, indicative of widespread metastatic disease to the bones. With appears very similar to the prior examination. There continue to be multiple nondisplaced to minimally displaced pathologic fractures throughout the ribs bilaterally. In addition,  there is a new widely displaced fracture of the medial aspect of the right clavicle  with approximately 1.4 cm of lateral and anterior displacement, as well as high attenuation thickening of the adjacent musculature in the upper right pectoralis major and lower right sternocleidomastoid muscles, most compatible with intramuscular hematoma and/or tumor. IMPRESSION: 1. Widespread metastatic disease to the bones redemonstrated, with multiple nondisplaced minimally displaced bilateral rib fractures, as well as a new widely displaced fracture of the medial aspect of the right clavicle with probable adjacent intramuscular hemorrhage in the lower right sternocleidomastoid muscle and upper right pectoralis major muscle. Probable tumoral infiltration throughout the right breast, as well as bilateral axillary and subpectoral lymphadenopathy (right greater than left). 2. Unusual appearance of the lungs, similar to the prior study, which could suggest chronic systemic disease such as sarcoidosis or interstitial lung disease. The possibility of lymphangitic spread of tumor in the lungs could be considered, but is not strongly favored at this time. 3. Moderate left (predominantly subpulmonic) pleural effusion and small right pleural effusion. 4. Cholelithiasis. 5. Aortic atherosclerosis, in addition to 2 vessel coronary artery disease. Aortic Atherosclerosis (ICD10-I70.0). Electronically Signed   By: Vinnie Langton M.D.   On: 11/17/2018 14:55   Dg Chest Port 1 View  Result Date: 11/20/2018 CLINICAL DATA:  Hypoxia, metastatic breast cancer EXAM: PORTABLE CHEST 1 VIEW COMPARISON:  Portable exam 1200 hours compared to 11/19/2018 FINDINGS: Very low lung volumes. Stable heart size mediastinal contours. BILATERAL pulmonary opacities again identified, unchanged. No new infiltrate, pleural effusion or pneumothorax. Scattered areas of osseous sclerosis consistent with osseous metastatic disease IMPRESSION: Widespread osseous metastatic disease. Persistent pulmonary opacities. Electronically Signed   By: Lavonia Dana  M.D.   On: 11/20/2018 14:06   Dg Chest Port 1 View  Result Date: 11/19/2018 CLINICAL DATA:  Shortness of breath EXAM: PORTABLE CHEST 1 VIEW COMPARISON:  Yesterday FINDINGS: Very low volume chest with patchy bilateral infiltrate greater on the right. Some of these are streaky and suggestive of scarring or atelectasis. No visible effusion or pneumothorax. Largely obscured heart which is not enlarged by recent CT. Extensive osseous metastatic disease IMPRESSION: Stable low volume chest with atelectasis and interstitial opacity, reference chest CT from 2 days ago. Electronically Signed   By: Monte Fantasia M.D.   On: 11/19/2018 08:54   Dg Chest Port 1 View  Result Date: 11/18/2018 CLINICAL DATA:  Shortness of breath and increased weakness this morning; history metastatic breast cancer EXAM: PORTABLE CHEST 1 VIEW COMPARISON:  Portable exam 0823 hours compared to 11/17/2018 FINDINGS: Very low lung volumes. Normal heart size mediastinal contours. Patchy opacities in the lungs bilaterally favoring pneumonia over atelectasis. No pleural effusion or pneumothorax. Patchy osteosclerosis consistent with osseous metastatic disease. IMPRESSION: BILATERAL pulmonary opacities likely representing multifocal pneumonia. Electronically Signed   By: Lavonia Dana M.D.   On: 11/18/2018 09:30   Dg Chest Port 1 View  Result Date: 11/17/2018 CLINICAL DATA:  Shortness of breath.  Metastatic breast cancer. EXAM: PORTABLE CHEST 1 VIEW COMPARISON:  11/16/2018.  CT 10/26/2018. FINDINGS: Mediastinum stable. Heart size normal. Persistent left base atelectasis infiltrate. Right base atelectasis/infiltrate also noted on today's exam. Diffuse bilateral interstitial prominence again noted. Tiny left pleural effusion again noted. No pneumothorax. Multiple lytic lesions noted throughout the visualized bony structures. IMPRESSION: 1. Persistent left base atelectasis/infiltrate. Right base atelectasis/infiltrate also noted on today's exam. Small  left pleural effusion again noted. 2. Diffuse bilateral interstitial prominence again noted consistent with interstitial tumor spread this patient with  known metastatic disease. 3. Multiple lytic lesions are again noted throughout the visualized bony structures consistent with metastatic disease. Electronically Signed   By: Marcello Moores  Register   On: 11/17/2018 06:24   Dg Chest Port 1 View  Result Date: 11/16/2018 CLINICAL DATA:  Dyspnea, metastatic breast cancer EXAM: PORTABLE CHEST 1 VIEW COMPARISON:  Chest radiograph from one day prior. FINDINGS: Low lung volumes. Stable cardiomediastinal silhouette with mild cardiomegaly. No pneumothorax. Stable small bilateral pleural effusions. Stable patchy left lung base consolidation. Stable thick irregular reticular opacities in the upper lungs. IMPRESSION: 1. Stable low lung volumes with small bilateral pleural effusions. 2. Stable thick irregular reticular opacities in the upper lungs and patchy left lung base consolidation. Findings likely representing combination of lymphangitic tumor (as seen on recent chest CT) and atelectasis. A component of mild pulmonary edema cannot be excluded. Electronically Signed   By: Ilona Sorrel M.D.   On: 11/16/2018 07:13   Dg Abd 2 Views  Result Date: 11/13/2018 CLINICAL DATA:  Pt c/o sob, weakness, cancer patient. EXAM: ABDOMEN - 2 VIEW COMPARISON:  None. FINDINGS: Overall visualized bowel gas pattern is nonobstructive. Moderate amount of stool and gas throughout the colon. No evidence of free intraperitoneal air seen. Diffuse osseous metastases, as previously described. IMPRESSION: 1. Nonobstructive bowel gas pattern. Moderate amount of stool and gas throughout the colon. 2. Diffuse osseous metastases. Electronically Signed   By: Franki Cabot M.D.   On: 11/13/2018 12:33   Vas [place] Lower Extremity Venous (dvt)  Result Date: 11/18/2018  Lower Venous Study Indications: Swelling.  Risk Factors: Cancer. Limitations: Body habitus and  poor ultrasound/tissue interface. Comparison Study: No prior studies. Performing Technologist: Oliver Hum RVT  Examination Guidelines: A complete evaluation includes B-mode imaging, spectral Doppler, color Doppler, and power Doppler as needed of all accessible portions of each vessel. Bilateral testing is considered an integral part of a complete examination. Limited examinations for reoccurring indications may be performed as noted.  +---------+---------------+---------+-----------+----------+-------+ RIGHT    CompressibilityPhasicitySpontaneityPropertiesSummary +---------+---------------+---------+-----------+----------+-------+ CFV      Full           Yes      Yes                          +---------+---------------+---------+-----------+----------+-------+ SFJ      Full                                                 +---------+---------------+---------+-----------+----------+-------+ FV Prox  Full                                                 +---------+---------------+---------+-----------+----------+-------+ FV Mid   Full                                                 +---------+---------------+---------+-----------+----------+-------+ FV DistalFull                                                 +---------+---------------+---------+-----------+----------+-------+  PFV      Full                                                 +---------+---------------+---------+-----------+----------+-------+ POP      Full           Yes      Yes                          +---------+---------------+---------+-----------+----------+-------+ PTV      Full                                                 +---------+---------------+---------+-----------+----------+-------+ PERO     Full                                                 +---------+---------------+---------+-----------+----------+-------+    +---------+---------------+---------+-----------+----------+-------+ LEFT     CompressibilityPhasicitySpontaneityPropertiesSummary +---------+---------------+---------+-----------+----------+-------+ CFV      Full           Yes      Yes                          +---------+---------------+---------+-----------+----------+-------+ SFJ      Full                                                 +---------+---------------+---------+-----------+----------+-------+ FV Prox  Full                                                 +---------+---------------+---------+-----------+----------+-------+ FV Mid   Full                                                 +---------+---------------+---------+-----------+----------+-------+ FV DistalFull                                                 +---------+---------------+---------+-----------+----------+-------+ PFV      Full                                                 +---------+---------------+---------+-----------+----------+-------+ POP      Full           Yes      Yes                          +---------+---------------+---------+-----------+----------+-------+ PTV  Full                                                 +---------+---------------+---------+-----------+----------+-------+ PERO     Full                                                 +---------+---------------+---------+-----------+----------+-------+     Summary: Right: There is no evidence of deep vein thrombosis in the lower extremity. No cystic structure found in the popliteal fossa. Left: There is no evidence of deep vein thrombosis in the lower extremity. No cystic structure found in the popliteal fossa.  *See table(s) above for measurements and observations. Electronically signed by Deitra Mayo MD on 11/18/2018 at 11:42:50 AM.    Final    US Thoracentesis Asp Pleural Space W/img Guide  Result Date: 11/15/2018 INDICATION:  Patient with history of metastatic breast cancer, dyspnea, bilateral pleural effusions L>R. Request is made for therapeutic left thoracentesis. EXAM: ULTRASOUND GUIDED THERAPEUTIC LEFT THORACENTESIS MEDICATIONS: 10 mL 1% lidocaine COMPLICATIONS: None immediate. PROCEDURE: An ultrasound guided thoracentesis was thoroughly discussed with the patient and questions answered. The benefits, risks, alternatives and complications were also discussed. The patient understands and wishes to proceed with the procedure. Written consent was obtained. Ultrasound was performed to localize and mark an adequate pocket of fluid in the left chest. The area was then prepped and draped in the normal sterile fashion. 1% Lidocaine was used for local anesthesia. Under ultrasound guidance a 6 Fr Safe-T-Centesis catheter was introduced. Thoracentesis was performed. The catheter was removed and a dressing applied. FINDINGS: A total of approximately 400 mL of dark red fluid was removed. IMPRESSION: Successful ultrasound guided left thoracentesis yielding 400 mL of pleural fluid. Read by: Earley Abide, PA-C Electronically Signed   By: Markus Daft M.D.   On: 11/15/2018 14:32    ASSESSMENT: 58 y.o. [place] woman presenting 10/26/2018 with right-sided inflammatory breast cancer, stage IV, involving lungs, lymph nodes and bones, as follows:             (a) chest CT scan 10/26/2018 shows bilateral pleural effusions, possible lymphangitic spread of tumor, diffuse bony metastatic disease, and significant axillary mediastinal and hilar adenopathy             (b) bone scan 10/27/2018 is a "near [place]" consistent with widespread bony metastatic disease             (c) head CT with and without contrast 10/30/2018 shows no intracranial metastatic disease, multiple calvarial lesions             (d) CA-27-29 on 10/27/2018 was 1810.4  (1) pleural fluid from right thoracentesis 10/28/2018 confirms malignant cells consistent  with a breast primary, strongly estrogen and progesterone receptor positive, HER-2 not amplified, with an MIB-1 of 2%  (2) anastrozole started 10/29/2018             (a) palbociclib to start as soon as available             (b) denosumab/xgeva started 11/13/2018  (3) associated problems:             (a) hypoxia secondary to effusions             (  b) pain from bone lesions             (c) right upper extremity lymphedema             (d) anemia, requiring transfusion             (e) poor venous access  (4) genetics testing pending  (5) hypocalcemia  (6) hypokalemia  (7) Anemia  (8) thrombocytopenia  (9) goals of care  PLAN:  Suzanne [name] is feeling better and will discharge home today. She will go home with O2 and Home Health PT/OT. She will need to continue anastrozole and Ibrance. We will need to check her labs and tolerance to medications in about 1 week. I have sent a scheduling message to the [facility] for a 1 week lab and visit (with Wilber Bihari, NP). The patient will be notified with the date and time.    LOS: 17 days   Mikey Bussing, DNP, AGPCNP-BC, AOCNP 12/01/18

## 2018-12-01 NOTE — Progress Notes (Signed)
Bed was not delivered on 11/30/18 as expected. Zack, [employer] rep for today, notified of the situation and states will follow up and update CM.

## 2018-12-01 NOTE — Progress Notes (Signed)
Pt daughter called this nurse to inform of hospital bed delivery. Family in route to pick up pt. Will continue to monitor.

## 2018-12-01 NOTE — Progress Notes (Signed)
CM received call from [facility], [employer] rep, stating that the bed was being loaded on the truck for delivery and would arrive tonight, however no time estimate was given. CM spoke with bedside RN to update her on the status who stated that the family would be coming to pick up the patient as soon as bed arrived.

## 2018-12-02 ENCOUNTER — Telehealth: Payer: Self-pay | Admitting: Pharmacist

## 2018-12-02 ENCOUNTER — Encounter: Payer: Self-pay | Admitting: General Practice

## 2018-12-02 ENCOUNTER — Other Ambulatory Visit: Payer: Self-pay | Admitting: Licensed Clinical Social Worker

## 2018-12-02 DIAGNOSIS — C50811 Malignant neoplasm of overlapping sites of right female breast: Secondary | ICD-10-CM

## 2018-12-02 NOTE — Progress Notes (Signed)
[place] has mailed disability application packet to patient via [place].  When received in [place], [place] will either leave w oncologist's desk RN for distribution to patient at appointment OR forward this to patient at home address.  Daughter informed.  Daughter also working on resources for ramp at home - has contacted volunteer group to determine if the fact that they rent their home precludes this assistance.  If needed, daughter states that family traveling for visit at end of July may be able to construct ramp.  [name], LCSW Clinical Social Worker Phone:  [phone]

## 2018-12-02 NOTE — Telephone Encounter (Signed)
Oral Chemotherapy Pharmacist Encounter   Spoke with patient's daughter, [name], today to follow up regarding patient's oral chemotherapy medication: Ibrance (palbociclib) for the treatment of metastatic, hormone receptor positive, HER2 receptor negative breast cancer in conjunction with anastrozole, planned duration until disease progression or unacceptable toxicity  Original Start date of oral chemotherapy: 11/24/2018  Patient started Ibrance while admitted She was discharged yesterday and did take a dose with breakfast this morning. She missed her dose on 11/26/2018 while admitted, reason unknown. They left the hospital with 14 capsules and should finish this 1st cycle on 7/20 Start of next cycle planned for 12/23/18 as long as there are no issues.  New medication list assessed for interactions, no significant drug interactions with Ibrance noted.  [name] provided with the phone number [phone]) to [place] patient assistance program to order next fill of Ibrance. [name] informed the [name] is actually being dispensed from contracted mail order pharmacy, [facility], in [place], [place]. [name] instructed to wait until follow-up visits at [facility] scheduled for 12/09/18 prior to ordering next Ibrance fill.  [name] informed that [name] has changed formulation from capsules to tablets. We discussed packaging of tablets so they feel informed about formulation change.  All questions answered. They know to call the office with questions or concerns.  Johny Drilling, PharmD, BCPS, BCOP  12/02/2018 11:53 AM Oral Oncology Clinic [phone]

## 2018-12-03 NOTE — Progress Notes (Signed)
Patient ID: Suzanne [name], female   DOB: [birth year], 58 y.o.   MRN: [id number]  Virtual Visit via Telephone Note  I connected with Suzanne [name] on 12/04/18 at  1:30 PM EDT by telephone and verified that I am speaking with the correct person using two identifiers.   I discussed the limitations, risks, security and privacy concerns of performing an evaluation and management service by telephone and the availability of in person appointments. I also discussed with the patient that there may be a patient responsible charge related to this service. The patient expressed understanding and agreed to proceed.  Patient location:  home My Location:  [facility] office Persons on the call: me, daughter(Suzanne [name]), and the patient  History of Present Illness: Patient presents to establish care After hospitalization 6/19-12/01/2018.  Appetite is improving.  Energy is improving.  Some edema in B feet, but not as bad as previously. Has an oncology and lab appointment next week.  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 Patient admitted underwent thoracentesis of the right and left lungs on 6/2 and 6/3 and scheduled for repeat 3 days later. Patient was admitted respiratory status worsened and patient placed on the BiPAP and transferred to the stepdown unit. Patient also placed on IV diuretics. Patient noted to spike a fever and as such also placed on IV antibiotics for presumed aspiration pneumonia. Due to tenuous respiratory status critical care  consulted. Patient underwent left-sided thoracentesis on 11/15/2018 with 400 cc of back red pleural fluid removed. Cytology from thoracentesis of 10/28/2018+ for malignant cells consistent with breast cancer. COVID-19 negative. IV antibiotics narrowed down to oral Augmentin starting 11/19/2018. Lower extremity Dopplers which were done were negative.  Oncology and palliative care was consulted for goals of care.   Hospital Course:  Acute on chronic respiratory failure with hypoxia (HCC) likely multifactorial secondary to malignant pleural effusions, metastatic stage IV breast CA, possible aspiration pneumonia, volume overload -CT chest on 6/22 had shown widespread metastatic disease to the bones, multiple bilateral rib fractures, medial aspect of right clavicle fracture, probable adjacent intramuscular hemorrhage on the right lower sternocleidomastoid muscle and upper right pectoralis major muscle, probable tumor infiltration throughout the right breast as well as bilateral axillary and subpectoral lymphadenopathy. Possibility of lymphangitic spread of the tumor in the lungs. -Status post thoracentesis on 6/28 on the left with 400 cc of dark red pleural fluid removed -Post thoracentesis, patient had increased O2 requirements and went into respiratory distress, needed BiPAP and diuresis. -Currently much improved, O2 sats 96% on 2 L. -Patient was placed on aggressive IV Lasix diuresis.  Negative balance of 14 L neg -Continue Lasix 40 mg twice daily, patient will need bmet in 1 week, if creatinine trending up, then decrease Lasix to 40 mg once a day -COVID-19 test negative,repeat COVID-19 test negative  Hypocalcemia -Likely secondary to recent Xgeva injection on 6/21. -Continue oral calcium replacement 1 tab 3 times daily, corrected calcium 7.7 at the time of discharge - Follow calcium level in 1 week.  Malignant pleural effusion/loculated pleural effusion in the setting of  right-sided  inflammatory stage IV breast CA, mets involving lungs, lymph nodes, bones -Status post thoracentesis, cytology positive for malignant cells consistent with breast cancer. -Patient may require Pleurx catheter if recurrence of effusion however not at this time per CCM. -Oncology, palliative care were consulted.  Continue arimidex -Oncology started patient on palbociclib on 6/30. -Continue Lasix, creatinine stable at 0.8, will need bmet in 1 week  Probable aspiration pneumonia -Patient was initially placed on IV antibiotics empirically, transitioned to oral Augmentin, patient has completed course of antibiotics  Microcytic anemia -Patient received 1 unit of packed RBC transfusion on 7/3 for hemoglobin of 6.9.  She tolerated blood transfusion without any difficulty -Hemoglobin stable 8.1  Volume overload, suspected acute diastolic CHF in the setting of blood transfusion -On 5/31, patient received 2 units packed RBCs for hemoglobin of 4.7, subsequently went into respiratory distress, needed BiPAP and aggressive IV diuresis. - 2D echo 6/1 had shown EF of 60 to 77% with diastolic parameters indeterminate. -Hemoglobin 8.1 after transfusion again on 7/3, tolerated without any difficulty.  Sinus tachycardia Resolved  Diarrhea Resolved   Observations/Objective: A&Ox3  Assessment and Plan: 1. Acute anemia Being followed by oncology  2. Pleural effusion, bilateral Resolving; Being followed by oncology  3. Malignant neoplasm metastatic to right lung [facility]) Being followed by oncology   4. Palliative care by specialist Being followed by oncology   5. Hospital discharge follow-up Establishing as PCP today.  No RF needed currently.  Has labs next week with oncology.     Follow Up Instructions: 2-3 weeks assign PCP-in office   I discussed the assessment and treatment plan with the patient. The patient was provided an opportunity to ask questions and all were answered. The patient  agreed with the plan and demonstrated an understanding of the instructions.   The patient was advised to call back or seek an in-person evaluation if the symptoms worsen or if the condition fails to improve as anticipated.  I provided 16 minutes of non-face-to-face time during this encounter.   Freeman Caldron, PA-C

## 2018-12-04 ENCOUNTER — Other Ambulatory Visit: Payer: Self-pay

## 2018-12-04 ENCOUNTER — Ambulatory Visit: Payer: Medicaid Other | Attending: Family Medicine | Admitting: Physician Assistant

## 2018-12-04 DIAGNOSIS — J90 Pleural effusion, not elsewhere classified: Secondary | ICD-10-CM | POA: Diagnosis not present

## 2018-12-04 DIAGNOSIS — Z09 Encounter for follow-up examination after completed treatment for conditions other than malignant neoplasm: Secondary | ICD-10-CM

## 2018-12-04 DIAGNOSIS — Z515 Encounter for palliative care: Secondary | ICD-10-CM | POA: Diagnosis not present

## 2018-12-04 DIAGNOSIS — D649 Anemia, unspecified: Secondary | ICD-10-CM

## 2018-12-04 DIAGNOSIS — C7801 Secondary malignant neoplasm of right lung: Secondary | ICD-10-CM

## 2018-12-04 NOTE — Progress Notes (Signed)
Pt. Daughter is assisting patient with the phone visit for her hospital follow up.  Pt. Stated she is feeling fine.

## 2018-12-05 ENCOUNTER — Encounter: Payer: Self-pay | Admitting: General Practice

## 2018-12-05 NOTE — Progress Notes (Signed)
[facility] CSW Progress Notes  Call to home to assess for needs/resources.  Per daughter, pt is doing much better.  Has two [facility] RN visits scheduled for today.  Daughter working on Development worker, community - aware that [employer] is sending mother's paperwork to [facility] as pt was in hospital when they were attempting to contact her.  Support Center will need to forward to patient.  Daughter working on getting ramp built - home is rental, so options are limited.  Family members arriving at end of month may be able to assist.  Mother has [facility] transport and [facility] available - daughter aware of how to schedule.  No needs at this time.    [name], LCSW Clinical Social Worker Phone:  [phone]

## 2018-12-08 NOTE — Progress Notes (Addendum)
[place]  Telephone:(336) [phone] Fax:(336) [phone]   ID: KHYLIE LARMORE DOB: 1960/06/06  MR#: [id number]  HBZ#:[id number]  Patient Care Team: Patient, No Pcp Per as PCP - General ([place]) [name], Virgie Dad, MD as Consulting Physician (Oncology) OTHER MD:   CHIEF COMPLAINT: Stage IV estrogen receptor positive breast cancer  CURRENT TREATMENT: Anastrozole/Palbociclib/Xgeva   INTERVAL HISTORY: Suzanne [name] returns today for follow-up and treatment of her Metastatic cancer. She was brought with [facility] transportation.    She was started on Anastrozole in early June, 2020.  She says that she tolerates this well and hasn't noted hot flashes, arthralgias, or vaginal dryness.    Toshiye notes that she is taking Palbociclib and isn't sure when she started or what the dosage is.  I called her daughter Suzanne [name] who verified that she has six pills left of Palbociclib.    [name] was admitted to the hospital from 11/14/2018 through 12/01/2018 and anemia, pneumonia, vomlume overload, hypocalcemia, pleural effusions.  She was discharged home with home health PT/OT, RN home health aid, social work, home oxygen, and hospital bed.  She is down 33 pounds as compared to her 10/30/2018 weight.     REVIEW OF SYSTEMS: Armie is doing moderately well.  She notes that she is slowly regaining her strength.  She is working with PT and OT.  She says she has a cough.  She notes she started coughing last night and interfered with her sleep.  Hycodan and tessalon perrles haven't helped.  She isn't sure how to use the inhaler that was prescribed.  Janiyla hasn't had a bowel movement in a week.  She is taking Miralax daily.  She denies any stomach pain and cramping.  Paisleigh does not feel short of breath.  She denies any fever or chills.  She notes some increase in swelling in her lower extremities.  She is taking lasix 33m po BID.    Otherwise, LCotyis doing well and a detailed ROS was otherwise non  contributory.     HISTORY OF CURRENT ILLNESS: From the original inpatient consult note:  [place]uzanne [name] [place] 58[place] with exception of obesity, remote history of gallstones, and remote history of mastitis.  The patient presented to the hospital on 10/26/2018 with a 3-week history of worsening shortness of breath and lower extremity edema.  The patient reported that her shortness of breath worsened with exertion.  Her lower extremity edema did not improve with elevation.  She also had an intermittent pr oductive cough with rare sputum production.  The patient was noted to be anemic with a hemoglobin of 4.5 with a clumped platelets noted on admission.  Stool for occult blood was positive.  Patient received 2 units of packed red blood cells.    The patient reported a rash under her breast x1 week which has been foul-smelling.  She also reported that she has some discomfort and thickening/hardness of her right breast for several months.  She states that her right breast is much larger than her left.  She notes some occasional bloody discharge from her breast once in a while.  A chest x-ray on admission showed diffuse bilateral interstitial opacity of unknown chronicity, findings could be related to interstitial inflammatory process, atypical/viral pneumonia, or possible metastatic disease.  There are also sclerotic and lytic lesions within the clavicles, bilateral ribs, shoulders which was concerning for diffuse few skeletal metastatic disease.    This prompted  a CT scan of the chest with contrast which showed bilateral axillary and mediastinal adenopathy, borderline hilar lymph nodes bilaterally, findings likely flecked metastatic lymphadenopathy, moderate bilateral pleural effusions, extensive interstitial thickening and reticulonodular opacities throughout the lungs, cannot exclude lymphangitic spread of tumor, diffuse skeletal  metastases.  The patient's subsequent history is as detailed below.   PAST MEDICAL HISTORY: Past Medical History:  Diagnosis Date  . Cancer ([place])   . Gallstones   . Mastitis    reports history of recurrent mastitis  . Metastatic breast cancer ([place])   . Obesity     PAST SURGICAL HISTORY: Past Surgical History:  Procedure Laterality Date  . IR THORACENTESIS ASP PLEURAL SPACE W/IMG GUIDE  10/28/2018  . IR THORACENTESIS ASP PLEURAL SPACE W/IMG GUIDE  10/29/2018    FAMILY HISTORY: Family History  Problem Relation Age of Onset  . Breast cancer Sister        in her 24's-70s  . Heart disease Brother        CABG  . Heart disease Brother        CABG  . Skin cancer Brother        melanoma  . Colon cancer Cousin   . Heart attack Mother   . Heart attack Father   . Prostate cancer Brother   Patient's father passed away at age 41, and her mother at 82, both from heart attacks. The patient has 8 siblings, 6 brothers and 1 sister.  Her sister was diagnosed with breast cancer. One brother has prostate cancer, another brother had a melanoma removed.    GYNECOLOGIC HISTORY:  Menarche: 58 years old Age at first live birth: 58 years old [place] P 1 LMP age 21 Contraceptive: used for 2-3 years with no problems HRT never used  Hysterectomy? no   SOCIAL HISTORY:  Trevia works for an Engineer, manufacturing systems. She is widowed. She lives at home with her daughter. Daughter Suzanne [name], age 54, is a Psychologist, occupational at the science center.  The patient has no grandchildren. She is not a Ambulance person.   ADVANCED DIRECTIVES: not on file; she has the paperwork already. She intends to name her daughter as her HCPOA.   HEALTH MAINTENANCE: Social History   Tobacco Use  . Smoking status: Never Smoker  . Smokeless tobacco: Never Used  Substance Use Topics  . Alcohol use: No  . Drug use: No    Colonoscopy:   PAP:   Bone density:  Mammography:   No Known Allergies  Current Outpatient Medications   Medication Sig Dispense Refill  . albuterol (VENTOLIN HFA) 108 (90 Base) MCG/ACT inhaler Inhale 2 puffs into the lungs every 6 (six) hours as needed for wheezing or shortness of breath. 16 g 3  . anastrozole (ARIMIDEX) 1 MG tablet Take 1 tablet (1 mg total) by mouth daily. 30 tablet 0  . benzonatate (TESSALON) 200 MG capsule Take 1 capsule (200 mg total) by mouth 3 (three) times daily as needed for cough. 30 capsule 0  . calcium-vitamin D (OSCAL WITH D) 500-200 MG-UNIT tablet Take 1 tablet by mouth 3 (three) times daily. 90 tablet 1  . carvedilol (COREG) 6.25 MG tablet Take 1 tablet (6.25 mg total) by mouth 2 (two) times daily with a meal. 60 tablet 3  . clotrimazole (LOTRIMIN) 1 % cream Apply topically 2 (two) times daily. For 2-4 weeks. 30 g 0  . furosemide (LASIX) 20 MG tablet Take 2 tablets (40 mg total) by mouth 2 (two) times daily.  120 tablet 4  . loratadine (CLARITIN) 10 MG tablet Take 1 tablet (10 mg total) by mouth daily. 30 tablet 3  . nystatin (MYCOSTATIN/NYSTOP) powder Apply topically 3 (three) times daily. Apply under breast area 15 g 1  . oxyCODONE (OXY IR/ROXICODONE) 5 MG immediate release tablet Take 0.5 tablets (2.5 mg total) by mouth every 4 (four) hours as needed for moderate pain or severe pain. 20 tablet 0  . palbociclib (IBRANCE) 125 MG capsule Take 1 capsule (125 mg total) by mouth daily with breakfast. Take whole with food. Take for 21 days on, 7 days off, repeat every 28 days. 21 capsule 6  . polyethylene glycol (MIRALAX / GLYCOLAX) 17 g packet Take 17 g by mouth as needed for constipation.    . predniSONE (DELTASONE) 5 MG tablet Take 1 tablet (5 mg total) by mouth daily with breakfast. 30 tablet 0  . promethazine-codeine (PHENERGAN WITH CODEINE) 6.25-10 MG/5ML syrup Take 5 mLs by mouth every 8 (eight) hours as needed for cough. 120 mL 0   No current facility-administered medications for this visit.      OBJECTIVE:   Vitals:   12/09/18 1322  BP: (!) 106/49  Pulse:  95  Resp: 18  Temp: 98.9 F (37.2 C)  SpO2: 99%     There is no height or weight on file to calculate BMI.   Wt Readings from Last 3 Encounters:  12/01/18 191 lb 1.6 oz (86.7 kg)  10/30/18 234 lb 2.1 oz (106.2 kg)  02/21/11 235 lb (106.6 kg)  ECOG FS:2 GENERAL: Patient is morbidly obese, appears tired and chronically ill, wearing oxygen Examined in wheelchair, exam limited due to disability and body habitus HEENT:  Sclerae anicteric.  Oropharynx clear and moist. No ulcerations or evidence of oropharyngeal candidiasis. Neck is supple.  NODES:  No cervical, supraclavicular, or axillary lymphadenopathy palpated.  BREAST EXAM:  Deferred. LUNGS:  Diminished breath sounds bilateral lower lobes, + wheezes in upper lobes HEART:  Regular rate and rhythm. No murmur appreciated. ABDOMEN:  Soft, nontender.  Positive, normoactive bowel sounds. No organomegaly palpated. MSK:  No focal spinal tenderness to palpation.  EXTREMITIES:  +1-2 bilateral lower extremity edema SKIN:  Clear with no obvious rashes or skin changes. No nail dyscrasia. NEURO:  Nonfocal. Well oriented.  Appropriate affect.     LAB RESULTS:  CMP     Component Value Date/Time   NA 134 (L) 12/09/2018 1236   K 4.3 12/09/2018 1236   CL 90 (L) 12/09/2018 1236   CO2 35 (H) 12/09/2018 1236   GLUCOSE 134 (H) 12/09/2018 1236   BUN 14 12/09/2018 1236   CREATININE 0.95 12/09/2018 1236   CALCIUM 7.8 (L) 12/09/2018 1236   PROT 7.3 12/09/2018 1236   ALBUMIN 2.1 (L) 12/09/2018 1236   AST 19 12/09/2018 1236   ALT 28 12/09/2018 1236   ALKPHOS 288 (H) 12/09/2018 1236   BILITOT 0.6 12/09/2018 1236   GFRNONAA >60 12/09/2018 1236   GFRAA >60 12/09/2018 1236    No results found for: TOTALPROTELP, ALBUMINELP, A1GS, A2GS, BETS, BETA2SER, GAMS, MSPIKE, SPEI  No results found for: KPAFRELGTCHN, LAMBDASER, KAPLAMBRATIO  Lab Results  Component Value Date   WBC 4.2 12/09/2018   NEUTROABS 2.3 12/09/2018   HGB 7.2 (L) 12/09/2018   HCT  22.8 (L) 12/09/2018   MCV 100.9 (H) 12/09/2018   PLT 65 (L) 12/09/2018    _0 @  No results found for: LABCA2  No components found for: [id number]  No results for input(s):  INR in the last 168 hours.  No results found for: LABCA2  No results found for: CAN199  No results found for: YTK354  No results found for: SFK812  Lab Results  Component Value Date   CA2729 300.5 (H) 12/09/2018    No components found for: HGQUANT  No results found for: CEA1 / No results found for: CEA1   No results found for: AFPTUMOR  No results found for: CHROMOGRNA  No results found for: PSA1  Clinical Support on 12/09/2018  Component Date Value Ref Range Status  . WBC 12/09/2018 4.2  4.0 - 10.5 K/uL Final  . RBC 12/09/2018 2.26* 3.87 - 5.11 MIL/uL Final  . Hemoglobin 12/09/2018 7.2* 12.0 - 15.0 g/dL Final  . HCT 12/09/2018 22.8* 36.0 - 46.0 % Final  . MCV 12/09/2018 100.9* 80.0 - 100.0 fL Final  . MCH 12/09/2018 31.9  26.0 - 34.0 pg Final  . MCHC 12/09/2018 31.6  30.0 - 36.0 g/dL Final  . RDW 12/09/2018 17.2* 11.5 - 15.5 % Final  . Platelets 12/09/2018 65* 150 - 400 K/uL Final  . nRBC 12/09/2018 0.5* 0.0 - 0.2 % Final  . Neutrophils Relative % 12/09/2018 52  % Final  . Neutro Abs 12/09/2018 2.3  1.7 - 7.7 K/uL Final  . Lymphocytes Relative 12/09/2018 44  % Final  . Lymphs Abs 12/09/2018 1.8  0.7 - 4.0 K/uL Final  . Monocytes Relative 12/09/2018 1  % Final  . Monocytes Absolute 12/09/2018 0.1  0.1 - 1.0 K/uL Final  . Eosinophils Relative 12/09/2018 1  % Final  . Eosinophils Absolute 12/09/2018 0.0  0.0 - 0.5 K/uL Final  . Basophils Relative 12/09/2018 1  % Final  . Basophils Absolute 12/09/2018 0.0  0.0 - 0.1 K/uL Final  . Immature Granulocytes 12/09/2018 1  % Final  . Abs Immature Granulocytes 12/09/2018 0.02  0.00 - 0.07 K/uL Final  . Polychromasia 12/09/2018 PRESENT   Final   Performed at [facility], [place] [address] [place], [place] [address]   . CA 27.29 12/09/2018 300.5* 0.0 - 38.6 U/mL Final   Comment: (NOTE) Siemens Centaur Immunochemiluminometric Methodology [facility]) Values obtained with different assay methods or kits cannot be used interchangeably. Results cannot be interpreted as absolute evidence of the presence or absence of malignant disease. Performed At: [facility] [place], [place] [id number] Rush Farmer MD PR:[id number]   . Sodium 12/09/2018 134* 135 - 145 mmol/L Final  . Potassium 12/09/2018 4.3  3.5 - 5.1 mmol/L Final  . Chloride 12/09/2018 90* 98 - 111 mmol/L Final  . CO2 12/09/2018 35* 22 - 32 mmol/L Final  . Glucose, Bld 12/09/2018 134* 70 - 99 mg/dL Final  . BUN 12/09/2018 14  6 - 20 mg/dL Final  . Creatinine 12/09/2018 0.95  0.44 - 1.00 mg/dL Final  . Calcium 12/09/2018 7.8* 8.9 - 10.3 mg/dL Final  . Total Protein 12/09/2018 7.3  6.5 - 8.1 g/dL Final  . Albumin 12/09/2018 2.1* 3.5 - 5.0 g/dL Final  . AST 12/09/2018 19  15 - 41 U/L Final  . ALT 12/09/2018 28  0 - 44 U/L Final  . Alkaline Phosphatase 12/09/2018 288* 38 - 126 U/L Final  . Total Bilirubin 12/09/2018 0.6  0.3 - 1.2 mg/dL Final  . GFR, Est Non Af Am 12/09/2018 >60  >60 mL/min Final  . GFR, Est AFR Am 12/09/2018 >60  >60 mL/min Final  . Anion gap 12/09/2018 9  5 - 15 Final  Performed at [facility], [place] [address] [place], [place] [address]  . ABO/RH(D) 12/09/2018 O POS   Final  . Antibody Screen 12/09/2018 NEG   Final  . Sample Expiration 12/09/2018    Final                   Value:12/12/2018,2359 Performed at [facility], [place] [address], [place], [place] [address]   . Unit Number 12/09/2018 [id number]   Final  . Blood Component Type 12/09/2018 RBC, LR IRR   Final  . Unit division 12/09/2018 00   Final  . Status of Unit 12/09/2018 ALLOCATED   Final  . Transfusion Status 12/09/2018 OK TO TRANSFUSE   Final  . Crossmatch Result 12/09/2018 Compatible   Final  .  Blood Product Unit Number 12/09/2018 [id number]   Final  . PRODUCT CODE 12/09/2018 [id number]   Final  . Unit Type and Rh 12/09/2018 5100   Final  . Blood Product Expiration Date 12/09/2018 [id number]   Final    (this displays the last labs from the last 3 days)  No results found for: TOTALPROTELP, ALBUMINELP, A1GS, A2GS, BETS, BETA2SER, GAMS, MSPIKE, SPEI (this displays SPEP labs)  No results found for: KPAFRELGTCHN, LAMBDASER, KAPLAMBRATIO (kappa/lambda light chains)  No results found for: HGBA, HGBA2QUANT, HGBFQUANT, HGBSQUAN (Hemoglobinopathy evaluation)   Lab Results  Component Value Date   LDH 312 (H) 10/27/2018    Lab Results  Component Value Date   IRON 137 10/26/2018   TIBC 384 10/26/2018   IRONPCTSAT 36 (H) 10/26/2018   (Iron and TIBC)  Lab Results  Component Value Date   FERRITIN 797 (H) 10/26/2018    Urinalysis    Component Value Date/Time   COLORURINE YELLOW 10/27/2018 2346   APPEARANCEUR HAZY (A) 10/27/2018 2346   LABSPEC 1.038 (H) 10/27/2018 2346   [place] 5.0 10/27/2018 [place] 10/27/2018 2346   HGBUR NEGATIVE 10/27/2018 2346   BILIRUBINUR NEGATIVE 10/27/2018 2346   KETONESUR 5 (A) 10/27/2018 2346   PROTEINUR NEGATIVE 10/27/2018 2346   NITRITE NEGATIVE 10/27/2018 2346   LEUKOCYTESUR NEGATIVE 10/27/2018 2346     STUDIES:     ELIGIBLE FOR AVAILABLE RESEARCH PROTOCOL: no   ASSESSMENT: 58 y.o. [place], [place] woman presenting 10/26/2018 with right-sided inflammatory breast cancer, stage IV, involvnh lungs, lymph nodes and bones, as follows:  (a) chest CT scan 10/26/2018 shows bilateral pleural effusions, possible lymphangitic spread of tumor, diffuse bony metastatic disease, and significant axillary mediastinal and hilar adenopathy  (b) bone scan 10/27/2018 is a "near [place]" consistent with widespread bony metastatic disease  (c) head CT with and without contrast 10/30/2018 shows no intracranial metastatic  disease, multiple calvarial lesions  (d) CA-27-29 on 10/27/2018 was 1810.4  (1) pleural fluid from right thoracentesis 10/28/2018 confirms malignant cells consistent with a breast primary, strongly estrogen and progesterone receptor positive, HER-2 not amplified, with an MIB-1 of 2%  (2) anastrozole started 10/29/2018  (a) palbociclib to start 11/13/2018  (b) denosumab/xgeva to start 11/13/2018  (3) associated problems:  (a) hypoxia secondary to effusions  (b) pain from bone lesions  (c) right upper extremity lymphedema  (d) anemia, requiring transfusion  (e) poor venous access  (4) genetics testing pending     PLAN:  Letica is starting to feel better than she did prior.  I saw her with Dr. Jana Hakim today, due to her tenuous condition and recent discharge from the hospital.  He helped to formulate and guide the  plan below.  1. Metastatic breast cancer: Continue on Anastrozole, she is tolerating this well.  On Palbociclib daily at 160m, has taken 2 weeks.  She was instructed to stop this medication due to plt count of 65.  2. Anemia: She will receive 1 unit of blood tomorrow with lasix afterward.  3. Cough/Wheezing: I discontinued her Hycodan, and ordered Phenergan with Codeine.  I talked to her daughter JAnderson Maltaand explained that LRiotis wheezing and she needs to take the albuterol inhaler at least twice a day scheduled.  She has her home health nurse helping her to use the inhaler correctly.  I also sent in Prednisone 558mfor her to take daily.    4. Fluid overload/heart failure: I refilled her Lasix at 4021mID.  She still has some swelling, however her blood pressure is 106/49.  Her electrolytes are stable today.    5. Hypocalcemia: Her corrected calcium is 9.32 today.    LauMelvinall return tomorrow for blood.  We will also see her next week for labs and f/u.  She knows to call for any questions or concerns prior to her next appointment with us.[place]     LinWilber BihariP   12/10/18 7:17 PM Medical Oncology and Hematology [facility] [id number]. [phone] Fax. [phone] ADDENDUM: LauHazel very slowly improving.  She is managing better at home, with the help of her daughter.  She is going to start low-dose prednisone which I think will help the wheezing.  We are going to change her antitussive to Phenergan and codeine syrup.  At this point I am encouraged that she is following up on the treatments and appearing to get modest initial benefit.  I personally saw this patient and performed a substantive portion of this encounter with the listed APP documented above.   GusChauncey CruelD Medical Oncology and Hematology [facility] Forest[place] [id number]. [phone] Fax. [phone]

## 2018-12-09 ENCOUNTER — Other Ambulatory Visit: Payer: Self-pay

## 2018-12-09 ENCOUNTER — Inpatient Hospital Stay (HOSPITAL_BASED_OUTPATIENT_CLINIC_OR_DEPARTMENT_OTHER): Payer: Medicaid Other | Admitting: Adult Health

## 2018-12-09 ENCOUNTER — Other Ambulatory Visit: Payer: Self-pay | Admitting: Adult Health

## 2018-12-09 ENCOUNTER — Telehealth: Payer: Self-pay | Admitting: *Deleted

## 2018-12-09 ENCOUNTER — Inpatient Hospital Stay: Payer: Medicaid Other

## 2018-12-09 VITALS — BP 106/49 | HR 95 | Temp 98.9°F | Resp 18

## 2018-12-09 DIAGNOSIS — G893 Neoplasm related pain (acute) (chronic): Secondary | ICD-10-CM | POA: Diagnosis not present

## 2018-12-09 DIAGNOSIS — Z79811 Long term (current) use of aromatase inhibitors: Secondary | ICD-10-CM | POA: Diagnosis not present

## 2018-12-09 DIAGNOSIS — D649 Anemia, unspecified: Secondary | ICD-10-CM

## 2018-12-09 DIAGNOSIS — C7951 Secondary malignant neoplasm of bone: Secondary | ICD-10-CM | POA: Diagnosis not present

## 2018-12-09 DIAGNOSIS — C50811 Malignant neoplasm of overlapping sites of right female breast: Secondary | ICD-10-CM | POA: Diagnosis not present

## 2018-12-09 DIAGNOSIS — C50911 Malignant neoplasm of unspecified site of right female breast: Secondary | ICD-10-CM

## 2018-12-09 DIAGNOSIS — E669 Obesity, unspecified: Secondary | ICD-10-CM | POA: Diagnosis not present

## 2018-12-09 DIAGNOSIS — Z170 Estrogen receptor positive status [ER+]: Secondary | ICD-10-CM

## 2018-12-09 DIAGNOSIS — C7801 Secondary malignant neoplasm of right lung: Secondary | ICD-10-CM

## 2018-12-09 DIAGNOSIS — J910 Malignant pleural effusion: Secondary | ICD-10-CM | POA: Diagnosis not present

## 2018-12-09 DIAGNOSIS — J90 Pleural effusion, not elsewhere classified: Secondary | ICD-10-CM

## 2018-12-09 DIAGNOSIS — R0602 Shortness of breath: Secondary | ICD-10-CM

## 2018-12-09 LAB — CMP (CANCER CENTER ONLY)
ALT: 28 U/L (ref 0–44)
AST: 19 U/L (ref 15–41)
Albumin: 2.1 g/dL — ABNORMAL LOW (ref 3.5–5.0)
Alkaline Phosphatase: 288 U/L — ABNORMAL HIGH (ref 38–126)
Anion gap: 9 (ref 5–15)
BUN: 14 mg/dL (ref 6–20)
CO2: 35 mmol/L — ABNORMAL HIGH (ref 22–32)
Calcium: 7.8 mg/dL — ABNORMAL LOW (ref 8.9–10.3)
Chloride: 90 mmol/L — ABNORMAL LOW (ref 98–111)
Creatinine: 0.95 mg/dL (ref 0.44–1.00)
GFR, Est AFR Am: >60 mL/min (ref >60)
GFR, Estimated: >60 mL/min (ref >60)
Glucose, Bld: 134 mg/dL — ABNORMAL HIGH (ref 70–99)
Potassium: 4.3 mmol/L (ref 3.5–5.1)
Sodium: 134 mmol/L — ABNORMAL LOW (ref 135–145)
Total Bilirubin: 0.6 mg/dL (ref 0.3–1.2)
Total Protein: 7.3 g/dL (ref 6.5–8.1)

## 2018-12-09 LAB — CBC WITH DIFFERENTIAL/PLATELET
Abs Immature Granulocytes: 0.02 10*3/uL (ref 0.00–0.07)
Basophils Absolute: 0 10*3/uL (ref 0.0–0.1)
Basophils Relative: 1 %
Eosinophils Absolute: 0 10*3/uL (ref 0.0–0.5)
Eosinophils Relative: 1 %
HCT: 22.8 % — ABNORMAL LOW (ref 36.0–46.0)
Hemoglobin: 7.2 g/dL — ABNORMAL LOW (ref 12.0–15.0)
Immature Granulocytes: 1 %
Lymphocytes Relative: 44 %
Lymphs Abs: 1.8 10*3/uL (ref 0.7–4.0)
MCH: 31.9 pg (ref 26.0–34.0)
MCHC: 31.6 g/dL (ref 30.0–36.0)
MCV: 100.9 fL — ABNORMAL HIGH (ref 80.0–100.0)
Monocytes Absolute: 0.1 10*3/uL (ref 0.1–1.0)
Monocytes Relative: 1 %
Neutro Abs: 2.3 10*3/uL (ref 1.7–7.7)
Neutrophils Relative %: 52 %
Platelets: 65 10*3/uL — ABNORMAL LOW (ref 150–400)
RBC: 2.26 MIL/uL — ABNORMAL LOW (ref 3.87–5.11)
RDW: 17.2 % — ABNORMAL HIGH (ref 11.5–15.5)
WBC: 4.2 10*3/uL (ref 4.0–10.5)
nRBC: 0.5 % — ABNORMAL HIGH (ref 0.0–0.2)

## 2018-12-09 MED ORDER — PROMETHAZINE-CODEINE 6.25-10 MG/5ML PO SYRP: 5.0000 mL | ORAL_SOLUTION | Freq: Three times a day (TID) | ORAL | 0 refills | Status: DC | PRN

## 2018-12-09 MED ORDER — FUROSEMIDE 20 MG PO TABS: 40.0000 mg | ORAL_TABLET | Freq: Two times a day (BID) | ORAL | 4 refills | Status: DC

## 2018-12-09 MED ORDER — PREDNISONE 5 MG PO TABS: 5.0000 mg | ORAL_TABLET | Freq: Every day | ORAL | 0 refills | Status: DC

## 2018-12-09 NOTE — Patient Instructions (Signed)
Stop taking the Palbociclib/Ibrance You need a transfusion, and we will give you one unit of blood tomorrow morning with Lasix afterwards We have added Phenergan-Codeine cough syrup to help with your cough We have added Prednisone daily to hlep with the wheezing. I refilled the Lasix today. We will see you back next week for f/u.

## 2018-12-09 NOTE — Telephone Encounter (Signed)
This RN returned call to pt's daughter and caregiver- [name] per her VM stating she has questions about medication changes and what to do.  This RN informed [name] to hold the Ibrance at this time.  Reviewed new prescriptions with [name]- of note - [name] states pt has a cough medication in the home that has hydrocodone in it and should she not give her that medication.  This RN verified to not give both the phenergan with codeine and cough medication with hydrocodone.  [name] stated concern for transportation tomorrow for blood and unable to arrange [facility] - she is planning on sending her mom by taxi- and asked if she was allowed to come with her mom ( since it is different facility ).  This RN informed [name] facility where blood is being administered is a [facility] facility and currently only the patient is allowed.  [name] understands if mom is unable to get into the taxi or has issues with her O2 and the taxi- they are to call this office for further assistance and rescheduling of blood transfusion.  [name] also asked about " when do I know mom's inhaler is empty ?"  This RN informed [name] above would best to be answered by the pharmacist - which [name] will be going to pick up the new medications- she will take the inhaler and ask at that time.  No further needs at this time.

## 2018-12-10 ENCOUNTER — Telehealth: Payer: Self-pay | Admitting: *Deleted

## 2018-12-10 ENCOUNTER — Telehealth: Payer: Self-pay | Admitting: Licensed Clinical Social Worker

## 2018-12-10 ENCOUNTER — Encounter: Payer: Self-pay | Admitting: General Practice

## 2018-12-10 ENCOUNTER — Other Ambulatory Visit: Payer: Self-pay | Admitting: Emergency Medicine

## 2018-12-10 ENCOUNTER — Encounter: Payer: Self-pay | Admitting: Adult Health

## 2018-12-10 ENCOUNTER — Encounter (HOSPITAL_COMMUNITY): Payer: Self-pay

## 2018-12-10 DIAGNOSIS — C7801 Secondary malignant neoplasm of right lung: Secondary | ICD-10-CM

## 2018-12-10 DIAGNOSIS — D649 Anemia, unspecified: Secondary | ICD-10-CM

## 2018-12-10 LAB — PREPARE RBC (CROSSMATCH)

## 2018-12-10 LAB — CANCER ANTIGEN 27.29: CA 27.29: 300.5 U/mL — ABNORMAL HIGH (ref 0.0–38.6)

## 2018-12-10 NOTE — Telephone Encounter (Signed)
Pt advised that they do not need a ride to the [facility] but does not know if she will need one on the way back home

## 2018-12-10 NOTE — Progress Notes (Signed)
[place] CSW Progress Notes  Patient connected w local church group that constructs wheelchair ramps, aware she needs to get landlord permission for this construction if church is able to assist. Application submitted to Patient [place] for [place] 19 assistance.  [name], LCSW Clinical Social Worker Phone:  [phone]

## 2018-12-10 NOTE — Progress Notes (Signed)
[place] CSW Progress Notes  Application for COVID 19 Emergency Assistance submitted to Patient [place] - if approved and funds available, $300 check will be mailed to patient's home address.  CSW asked patient and daughter to write up information on their need for ramp - will provide places for daughter/patient to reach out to and request help from volunteers to construct ramp.  Patient agreeable to all the above, verbal consent provided.  [name], LCSW Clinical Social Worker Phone:  [phone]

## 2018-12-10 NOTE — Telephone Encounter (Signed)
Contacted patient to verify webmail visit for pre reg

## 2018-12-10 NOTE — Telephone Encounter (Signed)
Received vm call from daughter, [name] stating that pt was unable to make appt today for blood transfusion.  [place] they are unable to get pt there until next week.  Talked with Threasa Beards RN & she states pt can come @ 0830 in Symptom Management.  Informed daughter & she will arrange transportation with [place] or [facility].

## 2018-12-10 NOTE — Progress Notes (Signed)
[place] CSW Progress Notes  Message from [employer], "pt did not make appt for blood transfusion today, says transportation does not work outViacom daughter [name].  Clarified issue.  Pt cannot use [facility] transport unless appt is at [facility].  Pt can only use taxi if the vehicle is low enough for her to get in (cannot use SUV vehicle as it is too high).  Per daughter, she cannot get pt into/out of house without ramp - "I had to call paramedics to get her into the house last night after the appointment, she cannot get into the house on her own."  CSW investigate options for ramp - family members are coming in two weeks and may be able to assist at that time.  CSW spoke w [place] re need to reschedule transfusion (and possibly genetics) appt so that pt can give [facility] at least 24 hours to schedule ride if appt is either NOT at [place] pt requires wheelchair.  If pt has appt at [facility] and does not require a wheelchair transport, [place] transport can be scheduled w much shorter notice (can even provide day of transport if needed).    CSW will call community resources for ramp.  Ramps typically are constructed by volunteers from [employer], many are not currently doing these projects due to [place] restrictions.  Per daughter, pt has family members coming from [facility] in two weeks who may be able to construct ramp.  Daughter has been unable to find rental ramp.  In addition, home has 3 steps which requires a long ramp distance.  Until ramp is in place, daughter encouraged to contact neighbor/family member/friend to be available to help patient get into the house.  Pt is quite fatigued, especially after appts outside of the home.  Daughter will submit forms to [facility] today for disability assistance application.  Will also be submitting forms for [facility].  [place] is working on [employer].  Daughter will get paperwork for [place] in [place] applications to [employer] as  she is able to do so.    Encouraged daughter to take time for self care today as this has been quite stressful for daughter.  [name], LCSW Clinical Social Worker Phone:  [phone]

## 2018-12-11 ENCOUNTER — Encounter: Payer: Self-pay | Admitting: Licensed Clinical Social Worker

## 2018-12-11 ENCOUNTER — Inpatient Hospital Stay: Payer: Medicaid Other | Admitting: Licensed Clinical Social Worker

## 2018-12-11 ENCOUNTER — Other Ambulatory Visit: Payer: Self-pay | Admitting: Adult Health

## 2018-12-11 ENCOUNTER — Encounter: Payer: Self-pay | Admitting: Adult Health

## 2018-12-11 ENCOUNTER — Inpatient Hospital Stay (HOSPITAL_BASED_OUTPATIENT_CLINIC_OR_DEPARTMENT_OTHER): Payer: Medicaid Other

## 2018-12-11 ENCOUNTER — Other Ambulatory Visit: Payer: Self-pay | Admitting: *Deleted

## 2018-12-11 ENCOUNTER — Other Ambulatory Visit: Payer: Self-pay

## 2018-12-11 VITALS — BP 118/65 | HR 83 | Temp 97.7°F | Resp 18 | Ht 62.0 in

## 2018-12-11 DIAGNOSIS — Z808 Family history of malignant neoplasm of other organs or systems: Secondary | ICD-10-CM

## 2018-12-11 DIAGNOSIS — C50911 Malignant neoplasm of unspecified site of right female breast: Secondary | ICD-10-CM

## 2018-12-11 DIAGNOSIS — Z803 Family history of malignant neoplasm of breast: Secondary | ICD-10-CM

## 2018-12-11 DIAGNOSIS — Z800 Family history of malignant neoplasm of digestive organs: Secondary | ICD-10-CM

## 2018-12-11 DIAGNOSIS — C50811 Malignant neoplasm of overlapping sites of right female breast: Secondary | ICD-10-CM | POA: Diagnosis not present

## 2018-12-11 DIAGNOSIS — C7951 Secondary malignant neoplasm of bone: Secondary | ICD-10-CM

## 2018-12-11 DIAGNOSIS — C7801 Secondary malignant neoplasm of right lung: Secondary | ICD-10-CM

## 2018-12-11 DIAGNOSIS — Z170 Estrogen receptor positive status [ER+]: Secondary | ICD-10-CM

## 2018-12-11 DIAGNOSIS — G893 Neoplasm related pain (acute) (chronic): Secondary | ICD-10-CM

## 2018-12-11 DIAGNOSIS — Z8042 Family history of malignant neoplasm of prostate: Secondary | ICD-10-CM

## 2018-12-11 MED ORDER — FUROSEMIDE 10 MG/ML IJ SOLN: 20.0000 mg | Freq: Once | INTRAMUSCULAR | Status: DC

## 2018-12-11 MED ORDER — SODIUM CHLORIDE 0.9% IV SOLUTION
250.0000 mL | Freq: Once | INTRAVENOUS | Status: AC
  Administered 2018-12-11: 09:00:00 250 mL via INTRAVENOUS
  Filled 2018-12-11: qty 250

## 2018-12-11 MED ORDER — SODIUM CHLORIDE 0.9% FLUSH
3.0000 mL | INTRAVENOUS | Status: DC | PRN
  Filled 2018-12-11: qty 10

## 2018-12-11 MED ORDER — DIPHENHYDRAMINE HCL 25 MG PO CAPS
ORAL_CAPSULE | ORAL | Status: AC
  Filled 2018-12-11: qty 1

## 2018-12-11 MED ORDER — FUROSEMIDE 20 MG PO TABS
ORAL_TABLET | ORAL | Status: AC
  Filled 2018-12-11: qty 2

## 2018-12-11 MED ORDER — DENOSUMAB 120 MG/1.7ML ~~LOC~~ SOLN
120.0000 mg | Freq: Once | SUBCUTANEOUS | Status: AC
  Administered 2018-12-11: 120 mg via SUBCUTANEOUS

## 2018-12-11 MED ORDER — FUROSEMIDE 20 MG PO TABS
40.0000 mg | ORAL_TABLET | Freq: Once | ORAL | Status: AC
  Administered 2018-12-11: 12:00:00 40 mg via ORAL

## 2018-12-11 MED ORDER — DIPHENHYDRAMINE HCL 25 MG PO CAPS
25.0000 mg | ORAL_CAPSULE | Freq: Once | ORAL | Status: AC
  Administered 2018-12-11: 25 mg via ORAL

## 2018-12-11 MED ORDER — SODIUM CHLORIDE 0.9% FLUSH
10.0000 mL | INTRAVENOUS | Status: DC | PRN
  Filled 2018-12-11: qty 10

## 2018-12-11 MED ORDER — HEPARIN SOD (PORK) LOCK FLUSH 100 UNIT/ML IV SOLN
500.0000 [IU] | Freq: Every day | INTRAVENOUS | Status: DC | PRN
  Filled 2018-12-11: qty 5

## 2018-12-11 MED ORDER — FUROSEMIDE 10 MG/ML IJ SOLN
INTRAMUSCULAR | Status: AC
  Filled 2018-12-11: qty 2

## 2018-12-11 MED ORDER — FUROSEMIDE 20 MG PO TABS: 40.0000 mg | ORAL_TABLET | Freq: Once | ORAL | Status: DC

## 2018-12-11 MED ORDER — ACETAMINOPHEN 325 MG PO TABS
650.0000 mg | ORAL_TABLET | Freq: Once | ORAL | Status: AC
  Administered 2018-12-11: 650 mg via ORAL

## 2018-12-11 MED ORDER — ACETAMINOPHEN 325 MG PO TABS
ORAL_TABLET | ORAL | Status: AC
  Filled 2018-12-11: qty 2

## 2018-12-11 MED ORDER — HEPARIN SOD (PORK) LOCK FLUSH 100 UNIT/ML IV SOLN
250.0000 [IU] | INTRAVENOUS | Status: DC | PRN
  Filled 2018-12-11: qty 5

## 2018-12-11 MED ORDER — DENOSUMAB 120 MG/1.7ML ~~LOC~~ SOLN
SUBCUTANEOUS | Status: AC
  Filled 2018-12-11: qty 1.7

## 2018-12-11 NOTE — Progress Notes (Signed)
Met with patient in lobby referred by [name] w/[employer] to discuss grant.  Patient approved for one-time $700 [place] to assist with personal expenses while going through treatment. Went over expenses and how they are covered. Patient has a copy of the approval letter as well as the expense sheet along with my card for any questions or concerns.

## 2018-12-11 NOTE — Patient Instructions (Addendum)
Blood Transfusion, Adult, Care After This sheet gives you information about how to care for yourself after your procedure. Your doctor may also give you more specific instructions. If you have problems or questions, contact your doctor. Follow these instructions at home:   Take over-the-counter and prescription medicines only as told by your doctor.  Go back to your normal activities as told by your doctor.  Follow instructions from your doctor about how to take care of the area where an IV tube was put into your vein (insertion site). Make sure you: ? Wash your hands with soap and water before you change your bandage (dressing). If there is no soap and water, use hand sanitizer. ? Change your bandage as told by your doctor.  Check your IV insertion site every day for signs of infection. Check for: ? More redness, swelling, or pain. ? More fluid or blood. ? Warmth. ? Pus or a bad smell. Contact a doctor if:  You have more redness, swelling, or pain around the IV insertion site.  You have more fluid or blood coming from the IV insertion site.  Your IV insertion site feels warm to the touch.  You have pus or a bad smell coming from the IV insertion site.  Your pee (urine) turns pink, red, or brown.  You feel weak after doing your normal activities. Get help right away if:  You have signs of a serious allergic or body defense (immune) system reaction, including: ? Itchiness. ? Hives. ? Trouble breathing. ? Anxiety. ? Pain in your chest or lower back. ? Fever, flushing, and chills. ? Fast pulse. ? Rash. ? Watery poop (diarrhea). ? Throwing up (vomiting). ? Dark pee. ? Serious headache. ? Dizziness. ? Stiff neck. ? Yellow color in your face or the white parts of your eyes (jaundice). Summary  After a blood transfusion, return to your normal activities as told by your doctor.  Every day, check for signs of infection where the IV tube was put into your vein.  Some  signs of infection are warm skin, more redness and pain, more fluid or blood, and pus or a bad smell where the needle went in.  Contact your doctor if you feel weak or have any unusual symptoms. This information is not intended to replace advice given to you by your health care provider. Make sure you discuss any questions you have with your health care provider. Document Released: 06/04/2014 Document Revised: 09/18/2017 Document Reviewed: 01/06/2016 Elsevier Patient Education  [place].    Denosumab injection What is this medicine? DENOSUMAB (den oh sue mab) slows bone breakdown. Prolia is used to treat osteoporosis in women after menopause and in men, and in people who are taking corticosteroids for 6 months or more. Delton See is used to treat a high calcium level due to cancer and to prevent bone fractures and other bone problems caused by multiple myeloma or cancer bone metastases. Delton See is also used to treat giant cell tumor of the bone. This medicine may be used for other purposes; ask your health care provider or pharmacist if you have questions. COMMON BRAND NAME(S): Prolia, XGEVA What should I tell my health care provider before I take this medicine? They need to know if you have any of these conditions:  dental disease  having surgery or tooth extraction  infection  kidney disease  low levels of calcium or Vitamin D in the blood  malnutrition  on hemodialysis  skin conditions or sensitivity  thyroid  or parathyroid disease  an unusual reaction to denosumab, other medicines, foods, dyes, or preservatives  pregnant or trying to get pregnant  breast-feeding How should I use this medicine? This medicine is for injection under the skin. It is given by a health care professional in a hospital or clinic setting. A special MedGuide will be given to you before each treatment. Be sure to read this information carefully each time. For Prolia, talk to your pediatrician  regarding the use of this medicine in children. Special care may be needed. For Delton See, talk to your pediatrician regarding the use of this medicine in children. While this drug may be prescribed for children as young as 13 years for selected conditions, precautions do apply. Overdosage: If you think you have taken too much of this medicine contact a poison control center or emergency room at once. NOTE: This medicine is only for you. Do not share this medicine with others. What if I miss a dose? It is important not to miss your dose. Call your doctor or health care professional if you are unable to keep an appointment. What may interact with this medicine? Do not take this medicine with any of the following medications:  other medicines containing denosumab This medicine may also interact with the following medications:  medicines that lower your chance of fighting infection  steroid medicines like prednisone or cortisone This list may not describe all possible interactions. Give your health care provider a list of all the medicines, herbs, non-prescription drugs, or dietary supplements you use. Also tell them if you smoke, drink alcohol, or use illegal drugs. Some items may interact with your medicine. What should I watch for while using this medicine? Visit your doctor or health care professional for regular checks on your progress. Your doctor or health care professional may order blood tests and other tests to see how you are doing. Call your doctor or health care professional for advice if you get a fever, chills or sore throat, or other symptoms of a cold or flu. Do not treat yourself. This drug may decrease your body's ability to fight infection. Try to avoid being around people who are sick. You should make sure you get enough calcium and vitamin D while you are taking this medicine, unless your doctor tells you not to. Discuss the foods you eat and the vitamins you take with your health  care professional. See your dentist regularly. Brush and floss your teeth as directed. Before you have any dental work done, tell your dentist you are receiving this medicine. Do not become pregnant while taking this medicine or for 5 months after stopping it. Talk with your doctor or health care professional about your birth control options while taking this medicine. Women should inform their doctor if they wish to become pregnant or think they might be pregnant. There is a potential for serious side effects to an unborn child. Talk to your health care professional or pharmacist for more information. What side effects may I notice from receiving this medicine? Side effects that you should report to your doctor or health care professional as soon as possible:  allergic reactions like skin rash, itching or hives, swelling of the face, lips, or tongue  bone pain  breathing problems  dizziness  jaw pain, especially after dental work  redness, blistering, peeling of the skin  signs and symptoms of infection like fever or chills; cough; sore throat; pain or trouble passing urine  signs of low calcium like  fast heartbeat, muscle cramps or muscle pain; pain, tingling, numbness in the hands or feet; seizures  unusual bleeding or bruising  unusually weak or tired Side effects that usually do not require medical attention (report to your doctor or health care professional if they continue or are bothersome):  constipation  diarrhea  headache  joint pain  loss of appetite  muscle pain  runny nose  tiredness  upset stomach This list may not describe all possible side effects. Call your doctor for medical advice about side effects. You may report side effects to FDA at 1-800-FDA-1088. Where should I keep my medicine? This medicine is only given in a clinic, doctor's office, or other health care setting and will not be stored at home. NOTE: This sheet is a summary. It may not cover  all possible information. If you have questions about this medicine, talk to your doctor, pharmacist, or health care provider.  2020 Elsevier/Gold Standard (2017-09-20 16:10:44)

## 2018-12-11 NOTE — Progress Notes (Signed)
Pt received 1 unit PRBCs today, tolerated well.  Drank and ate some during infusion.  2L [place] oxygen provided.  VSS.  Reports feeling better at end of tx.  Pt has difficulty ambulating/standing for long periods of time.  Pt originally finished blood transfusion about 1 to 1.5 hours before her [facility] scheduled pick up time.  When speaking with [facility] customer service there were no sooner appts available and the pt would be required to wait until her originally scheduled pickup time around 1-1:30 pm.  Pt verbalized understanding and was very pleasant about wait time.  Food and drinks provided, pt left on CC oxygen tank to preserve her own supply while she would wait in lobby.  Spoke with NP Mendel Ryder about pt waiting for her ride for extended period of time and requested that IV Lasix be changed to oral Lasix dose to be administered close to pt's approximate pickup time, as pt is alone in lobby and would have difficulty using the restroom frequently as is often typical with Lasix use after fluid/blood infusions.  NP Mendel Ryder spoke with pharmacy and approved the switch from 20 IV Lasix to 40 PO Lasix.  Pt's daughter [name] then called to alert [place] that the SCAT pickup appt was at 12:30 today, not 1:30 as originally thought by the patient.  Pt was given oral Lasix dose, switched to her home oxygen tank, and placed in front part of lobby in a w/c with her walker and all belongings at around 12:25.  Registration staff aware of pickup time.  Pt denied any further questions or concerns at time of d/c, verbalized understanding of d/c and f/u instructions including signs of transfusion reaction and fluid overload.

## 2018-12-11 NOTE — Progress Notes (Signed)
Correction to previous note patient approved for one-time grant of $1000 [employer]. Patient paperwork corrected and informed. She has my card for any additional financial questions or concerns.

## 2018-12-11 NOTE — Progress Notes (Signed)
Liza RN, concerned about giving IV lasix after her blood transfusion to patient and her waiting in the lobby and needing to urinate due to her decreased mobility.  She requested oral Lasix instead to be given just prior to her getting on the bus to go home.  I talked with Brandi in pharmacy who recommended that since her IV dose was 20mg  IV, she recommended 40mg  lasix PO.  I reviewed her blood pressure and placed orders for Lasix 40mg  PO.  Wilber Bihari, NP

## 2018-12-11 NOTE — Progress Notes (Signed)
REFERRING PROVIDER: Chauncey Cruel, MD [address],  [employer] [address]  PRIMARY PROVIDER:  Patient, No Pcp Per  PRIMARY REASON FOR VISIT:  1. Malignant neoplasm of overlapping sites of right breast in female, estrogen receptor positive ([place])   2. Family history of breast cancer   3. Family history of prostate cancer   4. Family history of melanoma   5. Family history of colon cancer    I connected with Suzanne [name] on 12/11/2018 at 3:20 PM EDT by Web[employer] and verified that I am speaking with the correct person using two identifiers.    Patient location: home Provider location: clinic    HISTORY OF PRESENT ILLNESS:   Suzanne [name], a 58 y.o. female, was seen for a [facility] cancer genetics consultation at the request of Dr. Jana Hakim due to a personal and family history of cancer.  Suzanne [name] presents to clinic today to discuss the possibility of a hereditary predisposition to cancer, genetic testing, and to further clarify her future cancer risks, as well as potential cancer risks for family members.   In 2020, at the age of 17, Suzanne [name] was diagnosed with stage IV breast cancer, ER/PR+, HER2-.  RISK FACTORS:  Menarche was at age 29.  First live birth at age 44.  OCP use for approximately 3 years.  Ovaries intact: yes.  Hysterectomy: no.  Menopausal status: postmenopausal.  HRT use: 0 years.   Past Medical History:  Diagnosis Date  . Cancer ([place])   . Family history of breast cancer   . Family history of colon cancer   . Family history of melanoma   . Family history of prostate cancer   . Gallstones   . Mastitis    reports history of recurrent mastitis  . Metastatic breast cancer ([place])   . Obesity     Past Surgical History:  Procedure Laterality Date  . IR THORACENTESIS ASP PLEURAL SPACE W/IMG GUIDE  10/28/2018  . IR THORACENTESIS ASP PLEURAL SPACE W/IMG GUIDE  10/29/2018    Social History   Socioeconomic History  . Marital status: Widowed   Spouse name: Not on file  . Number of children: 1  . Years of education: Not on file  . Highest education level: Not on file  Occupational History  . Occupation: Doctor, general practice    Comment: Self-employed  Social Needs  . Financial resource strain: Not on file  . Food insecurity    Worry: Not on file    Inability: Not on file  . Transportation needs    Medical: Not on file    Non-medical: Not on file  Tobacco Use  . Smoking status: Never Smoker  . Smokeless tobacco: Never Used  Substance and Sexual Activity  . Alcohol use: No  . Drug use: No  . Sexual activity: Not Currently  Lifestyle  . Physical activity    Days per week: Not on file    Minutes per session: Not on file  . Stress: Not on file  Relationships  . Social Herbalist on phone: Not on file    Gets together: Not on file    Attends religious service: Not on file    Active member of club or organization: Not on file    Attends meetings of clubs or organizations: Not on file    Relationship status: Not on file  Other Topics Concern  . Not on file  Social History Narrative   Has a daughter  named [name]. Daughter has CP.     FAMILY HISTORY:  We obtained a detailed, 4-generation family history.  Significant diagnoses are listed below: Family History  Problem Relation Age of Onset  . Breast cancer Sister        in her 77's-70s  . Heart disease Brother        CABG  . Heart disease Brother        CABG  . Skin cancer Brother        melanoma  . Colon cancer Cousin   . Heart attack Mother   . Heart attack Father   . Prostate cancer Brother     Suzanne [name] has one daughter, [name], 82, who was present during the session today. The patient has 6 brothers and 1 sister. One of her brothers was diagnosed with prostate cancer at 41 and is currently undergoing treatment, it is not metastatic. Another brother had a melanoma removed at 2. Her sister was diagnosed with breast cancer in around  age 25.   Suzanne [name] mother died at 69. Patient had 2 maternal uncles, 2 maternal aunts, no known cancers. No cancers in maternal cousins. She does not have information about maternal grandparents.  Suzanne [name] father died at 44. Patient had 1 paternal uncle, 2 paternal aunts, no cancers. One of her paternal cousins had colon cancer, unsure of age of diagnosis. She does not have information about her paternal grandparents.   Suzanne [name] is unaware of previous family history of genetic testing for hereditary cancer risks. Patient's maternal ancestors are of [place] descent, and paternal ancestors are of [place] and [place] descent. There is no reported Ashkenazi Jewish ancestry. There is no known consanguinity.  GENETIC COUNSELING ASSESSMENT: Suzanne [name] is a 58 y.o. female with a personal and family history  which is somewhat suggestive of a hereditary cancer syndrome and predisposition to cancer. We, therefore, discussed and recommended the following at today's visit.   DISCUSSION: We discussed that 5 - 10% of breast cancer is hereditary, with most cases associated with BRCA1/BRCA2 mutations.  There are other genes that can be associated with hereditary breast  cancer syndromes.  These include PALB2, ATM, CHEK2. We discussed that testing is beneficial for several reasons including aiding in systemic therapy decision making and understanding if other family members could be at risk for cancer and allow them to undergo genetic testing.   We reviewed the characteristics, features and inheritance patterns of hereditary cancer syndromes. We also discussed genetic testing, including the appropriate family members to test, the process of testing, insurance coverage and turn-around-time for results. We discussed the implications of a negative, positive and/or variant of uncertain significant result. We recommended Suzanne [name] pursue genetic testing for the [facility] Common Hereditary Cancers +  Melanoma gene panel.   The Common Hereditary Cancers Panel offered by [facility] includes sequencing and/or deletion duplication testing of the following 48 genes: APC, ATM, AXIN2, BARD1, BMPR1A, BRCA1, BRCA2, BRIP1, CDH1, CDKN2A (p14ARF), CDKN2A (p16INK4a), CKD4, CHEK2, CTNNA1, DICER1, EPCAM (Deletion/duplication testing only), GREM1 (promoter region deletion/duplication testing only), KIT, MEN1, MLH1, MSH2, MSH3, MSH6, MUTYH, NBN, NF1, NHTL1, PALB2, PDGFRA, PMS2, POLD1, POLE, PTEN, RAD50, RAD51C, RAD51D, RNF43, SDHB, SDHC, SDHD, SMAD4, SMARCA4. STK11, TP53, TSC1, TSC2, and VHL.  The following genes were evaluated for sequence changes only: SDHA and HOXB13 c.251G>A variant only.  The [facility] Melanoma Panel analyzed the following 9 genes: BAP1 BRCA2 CDK4 CDKN2A MITF POT1 PTEN RB1 Tp53.  Based on Suzanne [name] personal  history of metastatic Her2- breast cancer, she meets medical criteria for genetic testing. Since she does not have insurance, a PAP application was sent to her daughter to fill out. Testing should be offered by [facility] for free as long as they obtain the PAP application.  PLAN: After considering the risks, benefits, and limitations, Suzanne [name] provided informed consent to pursue genetic testing and the blood sample was sent to [facility] for analysis of the Common Hereditary Cancers Panel + Melanoma Panel. Results should be available within approximately 2-3 weeks' time, at which point they will be disclosed by telephone to Suzanne [name], as will any additional recommendations warranted by these results. Suzanne [name] will receive a summary of her genetic counseling visit and a copy of her results once available. This information will also be available in Epic.   Lastly, we encouraged Suzanne [name] to remain in contact with cancer genetics annually so that we can continuously update the family history and inform her of any changes in cancer genetics and testing that may be of benefit for this  family.   Suzanne [name]'s questions were answered to her satisfaction today. Our contact information was provided should additional questions or concerns arise. Thank you for the referral and allowing [place] to share in the care of your patient.   Faith Rogue, MS, [place] Genetic Counselor [email or web address] Phone: [phone]  The patient was seen for a total of 35 minutes in virtual genetic counseling.  Her daughter, [name], was also present. A [employer] Intern, Darliss Ridgel, was also present and assisted with this case. Drs. [name], Lindi Adie and/or Burr Medico were available for discussion regarding this case.   _______________________________________________________________________ For Office Staff:  Number of people involved in session: 3 Was an Intern/ student involved with case: yes

## 2018-12-12 ENCOUNTER — Encounter: Payer: Self-pay | Admitting: General Practice

## 2018-12-12 ENCOUNTER — Other Ambulatory Visit: Payer: Self-pay | Admitting: *Deleted

## 2018-12-12 DIAGNOSIS — C799 Secondary malignant neoplasm of unspecified site: Secondary | ICD-10-CM

## 2018-12-12 LAB — TYPE AND SCREEN
ABO/RH(D): O POS
Antibody Screen: NEGATIVE
Unit division: 0

## 2018-12-12 LAB — BPAM RBC
Blood Product Expiration Date: 202008122359
ISSUE DATE / TIME: 202007160950
Unit Type and Rh: 5100

## 2018-12-12 MED ORDER — ANASTROZOLE 1 MG PO TABS: 1.0000 mg | ORAL_TABLET | Freq: Every day | ORAL | 3 refills | Status: DC

## 2018-12-12 MED ORDER — BENZONATATE 200 MG PO CAPS: 200.0000 mg | ORAL_CAPSULE | Freq: Three times a day (TID) | ORAL | 0 refills | Status: DC | PRN

## 2018-12-12 NOTE — Progress Notes (Signed)
[place] CSW Progress Notes  Patient approved for medical transportation for [facility].  Daughter concerned that patient may not be able to ride SCAT because she is not able to get oxygen tank out of the house by herself.  In past, says that [facility] has come to the door and carried the O2 tank, worried that current approval says patient has to get to the curb.  CSW will communicate concern to [facility]/Courtney Rorie.  [facility] will need to do home assessment to determine that door to door service is feasible.  Community volunteer involved w ramp building program came today - concerned that number of steps required will be difficult.  He will work w landlord to see if there is a solution to this issue.  Funding is potentially available for materials.  Earliest ramp could be installed is one week from now.  [name], LCSW Clinical Social Worker Phone:  [phone]

## 2018-12-15 ENCOUNTER — Telehealth: Payer: Self-pay | Admitting: Pharmacist

## 2018-12-15 ENCOUNTER — Telehealth: Payer: Self-pay | Admitting: *Deleted

## 2018-12-15 NOTE — Telephone Encounter (Signed)
This RN contacted Thedore Mins- with [employer] per need for O2 concentrator per ambulatory use due to multiple medical appointments and pt being transported by [facility] or other public transportation.  Thedore Mins states he will follow up with pt per above.

## 2018-12-15 NOTE — Telephone Encounter (Signed)
Oral Oncology Pharmacist Encounter  Received voicemail from patient's daughter, [name], with questions about new medications that were prescribed last week and any medication interactions with patient's Ibrance. Medication list assessed, no significant drug interactions with Ibrance. Patient had been started on Ibrance 125 mg taken for 3 weeks on, one-week off, repeated every 4 weeks on 11/24/2018 [name] was subsequently held on 12/09/2018 (cycle 1 day 16) due to low blood counts. Patient has office visits tomorrow, 12/16/2018, for lab check and discussion of Ibrance restart. [name] informed that there may be some delay with Ibrance restart if her mom is restarted on a lower dose.  We will ensure that any new prescriptions get sent to med [facility] takes specialty pharmacy as patient is receiving her [name] through manufacturer compassionate use program. [name] informed that her mom can bring any unused Ibrance to the office and we will make sure it gets destroyed, in the event her Ibrance dose is changed.  All questions answered. [name] understands and is in agreement with above plan. She knows to call the office with any additional questions or concerns.  Johny Drilling, PharmD, BCPS, BCOP  12/15/2018 8:48 AM Oral Oncology Clinic [phone]

## 2018-12-16 ENCOUNTER — Telehealth: Payer: Self-pay | Admitting: *Deleted

## 2018-12-16 ENCOUNTER — Ambulatory Visit: Payer: Self-pay | Admitting: Adult Health

## 2018-12-16 ENCOUNTER — Other Ambulatory Visit: Payer: Self-pay

## 2018-12-16 ENCOUNTER — Telehealth: Payer: Self-pay | Admitting: Adult Health

## 2018-12-16 ENCOUNTER — Telehealth: Payer: Self-pay | Admitting: Medical Oncology

## 2018-12-16 ENCOUNTER — Other Ambulatory Visit: Payer: Self-pay | Admitting: *Deleted

## 2018-12-16 ENCOUNTER — Inpatient Hospital Stay: Payer: Medicaid Other

## 2018-12-16 ENCOUNTER — Encounter: Payer: Self-pay | Admitting: Adult Health

## 2018-12-16 ENCOUNTER — Inpatient Hospital Stay (HOSPITAL_BASED_OUTPATIENT_CLINIC_OR_DEPARTMENT_OTHER): Payer: Medicaid Other | Admitting: Adult Health

## 2018-12-16 VITALS — BP 108/47 | HR 94 | Temp 99.0°F | Resp 18

## 2018-12-16 DIAGNOSIS — R0602 Shortness of breath: Secondary | ICD-10-CM

## 2018-12-16 DIAGNOSIS — G893 Neoplasm related pain (acute) (chronic): Secondary | ICD-10-CM

## 2018-12-16 DIAGNOSIS — C7951 Secondary malignant neoplasm of bone: Secondary | ICD-10-CM

## 2018-12-16 DIAGNOSIS — D649 Anemia, unspecified: Secondary | ICD-10-CM

## 2018-12-16 DIAGNOSIS — J90 Pleural effusion, not elsewhere classified: Secondary | ICD-10-CM

## 2018-12-16 DIAGNOSIS — C7801 Secondary malignant neoplasm of right lung: Secondary | ICD-10-CM

## 2018-12-16 DIAGNOSIS — C50811 Malignant neoplasm of overlapping sites of right female breast: Secondary | ICD-10-CM

## 2018-12-16 DIAGNOSIS — Z170 Estrogen receptor positive status [ER+]: Secondary | ICD-10-CM | POA: Diagnosis not present

## 2018-12-16 LAB — COMPREHENSIVE METABOLIC PANEL
ALT: 46 U/L — ABNORMAL HIGH (ref 0–44)
AST: 36 U/L (ref 15–41)
Albumin: 1.9 g/dL — ABNORMAL LOW (ref 3.5–5.0)
Alkaline Phosphatase: 288 U/L — ABNORMAL HIGH (ref 38–126)
Anion gap: 10 (ref 5–15)
BUN: 12 mg/dL (ref 6–20)
CO2: 32 mmol/L (ref 22–32)
Calcium: 7.6 mg/dL — ABNORMAL LOW (ref 8.9–10.3)
Chloride: 92 mmol/L — ABNORMAL LOW (ref 98–111)
Creatinine, Ser: 0.72 mg/dL (ref 0.44–1.00)
GFR calc Af Amer: >60 mL/min (ref >60)
GFR calc non Af Amer: >60 mL/min (ref >60)
Glucose, Bld: 172 mg/dL — ABNORMAL HIGH (ref 70–99)
Potassium: 3.6 mmol/L (ref 3.5–5.1)
Sodium: 134 mmol/L — ABNORMAL LOW (ref 135–145)
Total Bilirubin: 0.8 mg/dL (ref 0.3–1.2)
Total Protein: 6.9 g/dL (ref 6.5–8.1)

## 2018-12-16 LAB — CBC WITH DIFFERENTIAL/PLATELET
Abs Immature Granulocytes: 0.04 10*3/uL (ref 0.00–0.07)
Basophils Absolute: 0 10*3/uL (ref 0.0–0.1)
Basophils Relative: 1 %
Eosinophils Absolute: 0 10*3/uL (ref 0.0–0.5)
Eosinophils Relative: 0 %
HCT: 25.1 % — ABNORMAL LOW (ref 36.0–46.0)
Hemoglobin: 8 g/dL — ABNORMAL LOW (ref 12.0–15.0)
Immature Granulocytes: 1 %
Lymphocytes Relative: 44 %
Lymphs Abs: 1.2 10*3/uL (ref 0.7–4.0)
MCH: 31.5 pg (ref 26.0–34.0)
MCHC: 31.9 g/dL (ref 30.0–36.0)
MCV: 98.8 fL (ref 80.0–100.0)
Monocytes Absolute: 0.2 10*3/uL (ref 0.1–1.0)
Monocytes Relative: 7 %
Neutro Abs: 1.3 10*3/uL — ABNORMAL LOW (ref 1.7–7.7)
Neutrophils Relative %: 47 %
Platelets: 46 10*3/uL — ABNORMAL LOW (ref 150–400)
RBC: 2.54 MIL/uL — ABNORMAL LOW (ref 3.87–5.11)
RDW: 18.2 % — ABNORMAL HIGH (ref 11.5–15.5)
WBC: 2.8 10*3/uL — ABNORMAL LOW (ref 4.0–10.5)
nRBC: 1.4 % — ABNORMAL HIGH (ref 0.0–0.2)

## 2018-12-16 LAB — TYPE AND SCREEN
ABO/RH(D): O POS
Antibody Screen: NEGATIVE

## 2018-12-16 NOTE — Progress Notes (Signed)
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 She was brought with [facility] transportation.    She was started on Anastrozole in early June, 2020.  She says that she tolerates this well and hasn't noted hot flashes, arthralgias, or vaginal dryness.    Suzanne [name] notes that she is taking Palbociclib.  This was stopped last week due to neutropenia and thrombocytopenia.    She receives xgeva every 4 weeks and her last dose was on 7/16.  She has been taking calcium daily. Her corrected calcium is table.    REVIEW OF SYSTEMS: Suzanne [name] is doing moderately well.  She notes her cough is improved.  She is wearing oxygen.  She says that she hasn't been using the inhaler, and prednisone as the coughing is better.  She doesn't think her swelling is getting worse.  Suzanne [name] denies fever or chills.  She does not have pain.  She has an occasional pain in her left arm.  She also notes her upper right arm pain.  She describes it as an aching.    Suzanne [name]'s appetite is decreased.  She says she will eat four to 10 bites per meal.  She says the pills fill her up.  She denies any bowel/bladder changes.  A detailed ROS was otherwise non contributory.     HISTORY OF CURRENT ILLNESS: From the original inpatient consult note:  Suzanne [name] is a 58 year old female from [place], [place] without significant past medical history with exception of obesity, remote history of gallstones, and remote history of mastitis.  The patient presented to the hospital on 10/26/2018 with  a 3-week history of worsening shortness of breath and lower extremity edema.  The patient reported that her shortness of breath worsened with exertion.  Her lower extremity edema did not improve with elevation.  She also had an intermittent pr oductive cough with rare sputum production.  The patient was noted to be anemic with a hemoglobin of 4.5 with a clumped platelets noted on admission.  Stool for occult blood was positive.  Patient received 2 units of packed red blood cells.    The patient reported a rash under her breast x1 week which has been foul-smelling.  She also reported that she has some discomfort and thickening/hardness of her right breast for several months.  She states that her right breast is much larger than her left.  She notes some occasional bloody discharge from her breast once in a while.  A chest x-ray on admission showed diffuse bilateral interstitial opacity of unknown chronicity, findings could be related to interstitial inflammatory process, atypical/viral pneumonia, or possible metastatic disease.  There are also sclerotic and lytic lesions within the clavicles, bilateral ribs, shoulders which was concerning for diffuse few skeletal metastatic disease.    This prompted a CT scan of the chest with contrast which showed bilateral axillary and mediastinal adenopathy, borderline hilar lymph nodes bilaterally, findings likely flecked metastatic lymphadenopathy, moderate bilateral pleural effusions, extensive interstitial thickening and reticulonodular opacities throughout the lungs, cannot exclude lymphangitic spread of tumor, diffuse skeletal metastases.  The patient's subsequent history is as detailed below.  PAST MEDICAL HISTORY: Past Medical History:  Diagnosis Date  . Cancer ([place])   . Family history of breast cancer   . Family history of colon cancer   . Family history of melanoma   . Family history of prostate cancer   . Gallstones   . Mastitis    reports history of  recurrent mastitis  . Metastatic breast cancer ([place])   . Obesity     PAST SURGICAL HISTORY: Past Surgical History:  Procedure Laterality Date  . IR THORACENTESIS ASP PLEURAL SPACE W/IMG GUIDE  10/28/2018  . IR THORACENTESIS ASP PLEURAL SPACE W/IMG GUIDE  10/29/2018    FAMILY HISTORY: Family History  Problem Relation Age of Onset  . Breast cancer Sister        in her 17's-70s  . Heart disease Brother        CABG  . Heart disease Brother        CABG  . Skin cancer Brother        melanoma  . Colon cancer Cousin   . Heart attack Mother   . Heart attack Father   . Prostate cancer Brother   Patient's father passed away at age 89, and her mother at 62, both from heart attacks. The patient has 8 siblings, 6 brothers and 1 sister.  Her sister was diagnosed with breast cancer. One brother has prostate cancer, another brother had a melanoma removed.    GYNECOLOGIC HISTORY:  Menarche: 58 years old Age at first live birth: 58 years old Suzanne [name] P 1 LMP age 68 Contraceptive: used for 2-3 years with no problems HRT never used  Hysterectomy? 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 She is widowed. She lives at home with her daughter. Daughter Suzanne [name], age 30, is a Psychologist, occupational at the science center.  The patient has no grandchildren. She is not a Ambulance person.   ADVANCED DIRECTIVES: not on file; she has the paperwork already. She intends to name her daughter as her HCPOA.   HEALTH MAINTENANCE: Social History   Tobacco Use  . Smoking status: Never Smoker  . Smokeless tobacco: Never Used  Substance Use Topics  . Alcohol use: No  . Drug use: No    Colonoscopy:   PAP:   Bone density:  Mammography:   No Known Allergies  Current Outpatient Medications  Medication Sig Dispense Refill  . albuterol (VENTOLIN HFA) 108 (90 Base) MCG/ACT inhaler Inhale 2 puffs into the lungs every 6 (six) hours as needed for wheezing or shortness of breath. 16 g 3  .  anastrozole (ARIMIDEX) 1 MG tablet Take 1 tablet (1 mg total) by mouth daily. 30 tablet 3  . benzonatate (TESSALON) 200 MG capsule Take 1 capsule (200 mg total) by mouth 3 (three) times daily as needed for cough. 30 capsule 0  . calcium-vitamin D (OSCAL WITH D) 500-200 MG-UNIT tablet Take 1 tablet by mouth 3 (three) times daily. 90 tablet 1  . carvedilol (COREG) 6.25 MG tablet Take 1 tablet (6.25 mg total) by mouth 2 (two) times daily with a meal. 60 tablet 3  . furosemide (LASIX) 20 MG tablet Take 2 tablets (40 mg total) by mouth 2 (two) times daily. 120 tablet 4  . loratadine (CLARITIN) 10 MG tablet Take 1 tablet (10 mg total) by mouth daily. 30 tablet 3  . nystatin (MYCOSTATIN/NYSTOP) powder Apply topically 3 (three) times daily. Apply under breast area 15 g 1  . oxyCODONE (OXY IR/ROXICODONE) 5  MG immediate release tablet Take 0.5 tablets (2.5 mg total) by mouth every 4 (four) hours as needed for moderate pain or severe pain. 20 tablet 0  . palbociclib (IBRANCE) 125 MG capsule Take 1 capsule (125 mg total) by mouth daily with breakfast. Take whole with food. Take for 21 days on, 7 days off, repeat every 28 days. 21 capsule 6  . polyethylene glycol (MIRALAX / GLYCOLAX) 17 g packet Take 17 g by mouth as needed for constipation.    . predniSONE (DELTASONE) 5 MG tablet Take 1 tablet (5 mg total) by mouth daily with breakfast. 30 tablet 0  . promethazine-codeine (PHENERGAN WITH CODEINE) 6.25-10 MG/5ML syrup Take 5 mLs by mouth every 8 (eight) hours as needed for cough. 120 mL 0   No current facility-administered medications for this visit.      OBJECTIVE:   Vitals:   12/16/18 1213  BP: (!) 108/47  Pulse: 94  Resp: 18  Temp: 99 F (37.2 C)  SpO2: 100%     There is no height or weight on file to calculate BMI.   Wt Readings from Last 3 Encounters:  12/01/18 191 lb 1.6 oz (86.7 kg)  10/30/18 234 lb 2.1 oz (106.2 kg)  02/21/11 235 lb (106.6 kg)  ECOG FS:2 GENERAL: Patient is morbidly  obese, appears tired and chronically ill, wearing oxygen Examined in wheelchair, exam limited due to disability and body habitus HEENT:  Sclerae anicteric.  Oropharynx clear and moist. No ulcerations or evidence of oropharyngeal candidiasis. Neck is supple.  NODES:  No cervical, supraclavicular, or axillary lymphadenopathy palpated.  BREAST EXAM:  Deferred. LUNGS:  Diminished breath sounds in bilateral bases, no wheezes, rhonchi or rales HEART:  Regular rate and rhythm. No murmur appreciated. ABDOMEN:  Soft, nontender.  Positive, normoactive bowel sounds. No organomegaly palpated. MSK:  No focal spinal tenderness to palpation.  EXTREMITIES:  +1-2 bilateral lower extremity edema SKIN:  Clear with no obvious rashes or skin changes. No nail dyscrasia. NEURO:  Nonfocal. Well oriented.  Appropriate affect.     LAB RESULTS:  CMP     Component Value Date/Time   NA 134 (L) 12/16/2018 1019   K 3.6 12/16/2018 1019   CL 92 (L) 12/16/2018 1019   CO2 32 12/16/2018 1019   GLUCOSE 172 (H) 12/16/2018 1019   BUN 12 12/16/2018 1019   CREATININE 0.72 12/16/2018 1019   CREATININE 0.95 12/09/2018 1236   CALCIUM 7.6 (L) 12/16/2018 1019   PROT 6.9 12/16/2018 1019   ALBUMIN 1.9 (L) 12/16/2018 1019   AST 36 12/16/2018 1019   AST 19 12/09/2018 1236   ALT 46 (H) 12/16/2018 1019   ALT 28 12/09/2018 1236   ALKPHOS 288 (H) 12/16/2018 1019   BILITOT 0.8 12/16/2018 1019   BILITOT 0.6 12/09/2018 1236   GFRNONAA >60 12/16/2018 1019   GFRNONAA >60 12/09/2018 1236   GFRAA >60 12/16/2018 1019   GFRAA >60 12/09/2018 1236    No results found for: TOTALPROTELP, ALBUMINELP, A1GS, A2GS, BETS, BETA2SER, GAMS, MSPIKE, SPEI  No results found for: KPAFRELGTCHN, LAMBDASER, KAPLAMBRATIO  Lab Results  Component Value Date   WBC 2.8 (L) 12/16/2018   NEUTROABS 1.3 (L) 12/16/2018   HGB 8.0 (L) 12/16/2018   HCT 25.1 (L) 12/16/2018   MCV 98.8 12/16/2018   PLT 46 (L) 12/16/2018    '@LASTCHEMISTRY' @  No results  found for: LABCA2  No components found for: YNWGNF621  No results for input(s): INR in the last 168 hours.  No results  found for: LABCA2  No results found for: CAN199  No results found for: NFA213  No results found for: YQM578  Lab Results  Component Value Date   CA2729 300.5 (H) 12/09/2018    No components found for: HGQUANT  No results found for: CEA1 / No results found for: CEA1   No results found for: AFPTUMOR  No results found for: [place]  No results found for: PSA1  Appointment on 12/16/2018  Component Date Value Ref Range Status  . WBC 12/16/2018 2.8* 4.0 - 10.5 K/uL Final  . RBC 12/16/2018 2.54* 3.87 - 5.11 MIL/uL Final  . Hemoglobin 12/16/2018 8.0* 12.0 - 15.0 g/dL Final  . HCT 12/16/2018 25.1* 36.0 - 46.0 % Final  . MCV 12/16/2018 98.8  80.0 - 100.0 fL Final  . MCH 12/16/2018 31.5  26.0 - 34.0 pg Final  . MCHC 12/16/2018 31.9  30.0 - 36.0 g/dL Final  . RDW 12/16/2018 18.2* 11.5 - 15.5 % Final  . Platelets 12/16/2018 46* 150 - 400 K/uL Final   Comment: Immature Platelet Fraction may be clinically indicated, consider ordering this additional test [id number]   . nRBC 12/16/2018 1.4* 0.0 - 0.2 % Final  . Neutrophils Relative % 12/16/2018 47  % Final  . Neutro Abs 12/16/2018 1.3* 1.7 - 7.7 K/uL Final  . Lymphocytes Relative 12/16/2018 44  % Final  . Lymphs Abs 12/16/2018 1.2  0.7 - 4.0 K/uL Final  . Monocytes Relative 12/16/2018 7  % Final  . Monocytes Absolute 12/16/2018 0.2  0.1 - 1.0 K/uL Final  . Eosinophils Relative 12/16/2018 0  % Final  . Eosinophils Absolute 12/16/2018 0.0  0.0 - 0.5 K/uL Final  . Basophils Relative 12/16/2018 1  % Final  . Basophils Absolute 12/16/2018 0.0  0.0 - 0.1 K/uL Final  . Immature Granulocytes 12/16/2018 1  % Final  . Abs Immature Granulocytes 12/16/2018 0.04  0.00 - 0.07 K/uL Final   Performed at [facility], [place] [address], [place], [place] [address]  . Sodium 12/16/2018 134* 135 - 145  mmol/L Final  . Potassium 12/16/2018 3.6  3.5 - 5.1 mmol/L Final  . Chloride 12/16/2018 92* 98 - 111 mmol/L Final  . CO2 12/16/2018 32  22 - 32 mmol/L Final  . Glucose, Bld 12/16/2018 172* 70 - 99 mg/dL Final  . BUN 12/16/2018 12  6 - 20 mg/dL Final  . Creatinine, Ser 12/16/2018 0.72  0.44 - 1.00 mg/dL Final  . Calcium 12/16/2018 7.6* 8.9 - 10.3 mg/dL Final  . Total Protein 12/16/2018 6.9  6.5 - 8.1 g/dL Final  . Albumin 12/16/2018 1.9* 3.5 - 5.0 g/dL Final  . AST 12/16/2018 36  15 - 41 U/L Final  . ALT 12/16/2018 46* 0 - 44 U/L Final  . Alkaline Phosphatase 12/16/2018 288* 38 - 126 U/L Final  . Total Bilirubin 12/16/2018 0.8  0.3 - 1.2 mg/dL Final  . GFR calc non Af Amer 12/16/2018 >60  >60 mL/min Final  . GFR calc Af Amer 12/16/2018 >60  >60 mL/min Final  . Anion gap 12/16/2018 10  5 - 15 Final   Performed at [facility], [place] [address] [place],  [address]  . ABO/RH(D) 12/16/2018 O POS   Final  . Antibody Screen 12/16/2018 NEG   Final  . Sample Expiration 12/16/2018    Final                   Value:12/19/2018,2359 Performed at  [facility], [place] [address], [place], [place] [address]     (this displays the last labs from the last 3 days)  No results found for: TOTALPROTELP, ALBUMINELP, A1GS, A2GS, BETS, BETA2SER, GAMS, MSPIKE, SPEI (this displays SPEP labs)  No results found for: KPAFRELGTCHN, LAMBDASER, KAPLAMBRATIO (kappa/lambda light chains)  No results found for: HGBA, HGBA2QUANT, HGBFQUANT, HGBSQUAN (Hemoglobinopathy evaluation)   Lab Results  Component Value Date   LDH 312 (H) 10/27/2018    Lab Results  Component Value Date   IRON 137 10/26/2018   TIBC 384 10/26/2018   IRONPCTSAT 36 (H) 10/26/2018   (Iron and TIBC)  Lab Results  Component Value Date   FERRITIN 797 (H) 10/26/2018    Urinalysis    Component Value Date/Time   COLORURINE YELLOW 10/27/2018 2346   APPEARANCEUR HAZY (A) 10/27/2018 2346    LABSPEC 1.038 (H) 10/27/2018 2346   [place] 5.0 10/27/2018 [place] 10/27/2018 2346   [place] 10/27/2018 2346   BILIRUBINUR NEGATIVE 10/27/2018 2346   KETONESUR 5 (A) 10/27/2018 2346   PROTEINUR NEGATIVE 10/27/2018 2346   NITRITE NEGATIVE 10/27/2018 2346   LEUKOCYTESUR NEGATIVE 10/27/2018 2346     STUDIES:     ELIGIBLE FOR AVAILABLE RESEARCH PROTOCOL: no   ASSESSMENT: 58 y.o. [place], [place] woman presenting 10/26/2018 with right-sided inflammatory breast cancer, stage IV, involvnh lungs, lymph nodes and bones, as follows:  (a) chest CT scan 10/26/2018 shows bilateral pleural effusions, possible lymphangitic spread of tumor, diffuse bony metastatic disease, and significant axillary mediastinal and hilar adenopathy  (b) bone scan 10/27/2018 is a "near [place]" consistent with widespread bony metastatic disease  (c) head CT with and without contrast 10/30/2018 shows no intracranial metastatic disease, multiple calvarial lesions  (d) CA-27-29 on 10/27/2018 was 1810.4  (1) pleural fluid from right thoracentesis 10/28/2018 confirms malignant cells consistent with a breast primary, strongly estrogen and progesterone receptor positive, HER-2 not amplified, with an MIB-1 of 2%  (2) anastrozole started 10/29/2018  (a) palbociclib to start 11/13/2018  (b) denosumab/xgeva to start 11/13/2018  (3) associated problems:  (a) hypoxia secondary to effusions  (b) pain from bone lesions  (c) right upper extremity lymphedema  (d) anemia, requiring transfusion  (e) poor venous access  (4) genetics testing pending    PLAN:  Destyni is doing moderately well today.  She continues on Anastrozole with good tolerance.  She will continue this.  I am delighted that her lung exam is improved.  She is not taking the cough medication or prednisone at this point.  Nelle will not restart Palbociclib today.  Her plt count remains decreased, so will wait another week to  recheck her labs.  Due to her recent hospitalization and her continued plt decrease, I will add on HIT panel to her labs.    Jenina and I discussed whether or not to give her blood today.  Her hemoglobin is 8.  Considering that she feels improved, and her issues with fluid overload, she will forego the transfusion today, and we will transfuse her next week if needed.    Annali tolerated her xgeva well.  She has continued on calcium supplementation and her corrected calcium is stable.  We reviewed her decreased albumin level.  I recommended she focus more on increased protein meals and supplements with boost or ensure/glucerna.  I will refer to our dietician for evaluation and consulation.    Thomasina will return in one week for labs, f/u, and possible transfusion.  She and her daughter know to call for any questions or concerns between now and then.  She was recommended to continue with the appropriate pandemic precautions.   A total of (30) minutes of face-to-face time was spent with this patient with greater than 50% of that time in counseling and care-coordination.    Wilber Bihari, NP  12/16/18 12:31 PM Medical Oncology and Hematology [facility] [address] [place], [place] [address] Tel. [phone]    Fax. [phone]

## 2018-12-16 NOTE — Telephone Encounter (Signed)
This RN spoke with pt's daughter- [name] - who states she has questions post visit today that her mother didn't mention-  1. [name] states her mom is " having hallucinations " - which she described as " she ask me to come over because she thinks she spilled her pills in her lap - but she didn't " " Or she thinks she dropped her pill bottle but she didn't "  [name] states her mother is not argumentative when [place] corrects her- and easily reorients. [name] stated " the doctor said she could get confused as a side effect but wanted to let you know "  2. [name] is concerned due to " how do I know she is getting good sleep because she is asleep but having very interesting conversations to herself ?  At times of above - [name] has thought the patient was talking to her - which she responds but then " it startles her and wakes her up ?"  With both the above situations - pt is not combative, reorients well and is not acting in any manner that is harmful.  3. [name] states her mother is having pain in her left arm - " but I thought the cancer was all on the right " Per inquiry- [name] states when her mom's pain is really bad - she says it is in the left arm ( shoulder area ).  She is having difficulty using arm to bear weight with her walking. She can hold things in her left hand " but trying to reach to get something causes pain "  This RN discussed above with covering provider- with request to obtain xrays to evaluate for an occult fracture due to bone mets.  This RN called [name] and discussed the above plan- discussed xrays are not scheduled but pt comes in when she can between 9-5. [name] will see if she can arrange transportation.  This RN also discussed need for [name] to keep a diary of her mom - regarding pain and confusion or other issues so these concerns can be better addressed at visit since when her mom is here alone- she does not give this  information.  [name] states she does keep a diary- this RN asked if [name] could use my chart to communicate about every other day how her mom is doing so we can be aware of issues for upcoming visits.  How to send messages in my chart reviewed with [name] verbalizing she will do the above.  No further needs at this time.

## 2018-12-16 NOTE — Patient Instructions (Signed)
You will not restart Palbociclib/Ibrance today.  Your labs are still slightly too low to restart it.  We will also not give you any blood products today since your hemoglobin has improved.  Continue taking Anastrozole/Arimidex daily.  We will see you back next week for labs, follow-up, and potentially blood.    Please call us if you have any questions/concerns prior to your next appointment.

## 2018-12-16 NOTE — Telephone Encounter (Signed)
Request order for OT evaluate and treat- Pt experiencing- "Possible arm atrophy"-she is having  difficulty gripping her walker handles. call back [phone].

## 2018-12-16 NOTE — Telephone Encounter (Signed)
I TALK with patients daughter regarding schedule

## 2018-12-17 ENCOUNTER — Telehealth: Payer: Self-pay

## 2018-12-17 ENCOUNTER — Telehealth: Payer: Self-pay | Admitting: Adult Health

## 2018-12-17 LAB — CANCER ANTIGEN 27.29: CA 27.29: 196.1 U/mL — ABNORMAL HIGH (ref 0.0–38.6)

## 2018-12-17 LAB — HEPARIN INDUCED PLATELET AB (HIT ANTIBODY): Heparin Induced Plt Ab: 0.077 OD (ref 0.000–0.400)

## 2018-12-17 NOTE — Telephone Encounter (Signed)
Prednisone 5mg  daily.  In the future, we will call Inge's daughter [name] during the appointment as well, so we can all be on the same page.  Can this be added on the appointment notes?

## 2018-12-17 NOTE — Telephone Encounter (Signed)
Spoke with pt's dtr by phone and explained that pt should be taking prednisone once a day every day.  Suzanne [name] verbalizes understanding and states that she will try to get her mom, the pt, to understand this as well.

## 2018-12-17 NOTE — Telephone Encounter (Signed)
I left a message regarding 8/4

## 2018-12-17 NOTE — Telephone Encounter (Signed)
Received call from pt's dtr, [name], needing clarification on prednisone prescription. Prescription written for 5mg  daily with breakfast, but pt is telling dtr per her visit yesterday she was told to only take it when she needs it.  Please advise.

## 2018-12-19 ENCOUNTER — Telehealth: Payer: Self-pay | Admitting: *Deleted

## 2018-12-19 ENCOUNTER — Telehealth: Payer: Self-pay | Admitting: Family Medicine

## 2018-12-19 NOTE — Telephone Encounter (Signed)
Gave Verbal order to [place] ,[facility] Nurse to evaluate open wound. Per Dr. Margarita Rana.

## 2018-12-19 NOTE — Telephone Encounter (Signed)
[name] at [facility] [phone] called for orders for open wound care. Wound larger than eraser head smaller than a quarter, bruised, skin breakdown sacral region.  Pt has only seen [facility]. Pt sees [name] for oncology only. Would a provider here at [facility] call to order wound care ASAP?  Please advise urgent request.

## 2018-12-20 ENCOUNTER — Ambulatory Visit (HOSPITAL_COMMUNITY)
Admission: RE | Admit: 2018-12-20 | Discharge: 2018-12-20 | Disposition: A | Discharge status: Home / Self Care | Payer: Medicaid Other | Source: Ambulatory Visit | Attending: Oncology | Admitting: Oncology

## 2018-12-20 ENCOUNTER — Other Ambulatory Visit: Payer: Self-pay

## 2018-12-20 DIAGNOSIS — C7801 Secondary malignant neoplasm of right lung: Secondary | ICD-10-CM | POA: Diagnosis present

## 2018-12-20 DIAGNOSIS — C7951 Secondary malignant neoplasm of bone: Secondary | ICD-10-CM | POA: Diagnosis not present

## 2018-12-20 DIAGNOSIS — R0602 Shortness of breath: Secondary | ICD-10-CM

## 2018-12-20 DIAGNOSIS — J90 Pleural effusion, not elsewhere classified: Secondary | ICD-10-CM

## 2018-12-20 DIAGNOSIS — G893 Neoplasm related pain (acute) (chronic): Secondary | ICD-10-CM | POA: Diagnosis present

## 2018-12-20 DIAGNOSIS — C50919 Malignant neoplasm of unspecified site of unspecified female breast: Secondary | ICD-10-CM | POA: Diagnosis not present

## 2018-12-22 ENCOUNTER — Telehealth: Payer: Self-pay | Admitting: Pharmacist

## 2018-12-22 ENCOUNTER — Inpatient Hospital Stay: Payer: Medicaid Other

## 2018-12-22 ENCOUNTER — Other Ambulatory Visit: Payer: Self-pay

## 2018-12-22 ENCOUNTER — Encounter: Payer: Self-pay | Admitting: Adult Health

## 2018-12-22 ENCOUNTER — Other Ambulatory Visit: Payer: Self-pay | Admitting: Adult Health

## 2018-12-22 ENCOUNTER — Inpatient Hospital Stay (HOSPITAL_BASED_OUTPATIENT_CLINIC_OR_DEPARTMENT_OTHER): Payer: Medicaid Other | Admitting: Adult Health

## 2018-12-22 VITALS — BP 105/47 | HR 96 | Temp 99.1°F | Resp 18 | Ht 62.0 in

## 2018-12-22 VITALS — BP 103/46 | HR 91 | Temp 97.9°F | Resp 24

## 2018-12-22 DIAGNOSIS — C7801 Secondary malignant neoplasm of right lung: Secondary | ICD-10-CM

## 2018-12-22 DIAGNOSIS — C50811 Malignant neoplasm of overlapping sites of right female breast: Secondary | ICD-10-CM | POA: Diagnosis not present

## 2018-12-22 DIAGNOSIS — C50911 Malignant neoplasm of unspecified site of right female breast: Secondary | ICD-10-CM

## 2018-12-22 DIAGNOSIS — C7951 Secondary malignant neoplasm of bone: Secondary | ICD-10-CM

## 2018-12-22 DIAGNOSIS — Z170 Estrogen receptor positive status [ER+]: Secondary | ICD-10-CM

## 2018-12-22 DIAGNOSIS — J90 Pleural effusion, not elsewhere classified: Secondary | ICD-10-CM

## 2018-12-22 DIAGNOSIS — G893 Neoplasm related pain (acute) (chronic): Secondary | ICD-10-CM

## 2018-12-22 DIAGNOSIS — R0602 Shortness of breath: Secondary | ICD-10-CM

## 2018-12-22 DIAGNOSIS — D649 Anemia, unspecified: Secondary | ICD-10-CM

## 2018-12-22 DIAGNOSIS — D630 Anemia in neoplastic disease: Secondary | ICD-10-CM

## 2018-12-22 LAB — CBC WITH DIFFERENTIAL (CANCER CENTER ONLY)
Abs Immature Granulocytes: 0.07 10*3/uL (ref 0.00–0.07)
Basophils Absolute: 0 10*3/uL (ref 0.0–0.1)
Basophils Relative: 0 %
Eosinophils Absolute: 0 10*3/uL (ref 0.0–0.5)
Eosinophils Relative: 0 %
HCT: 22.6 % — ABNORMAL LOW (ref 36.0–46.0)
Hemoglobin: 7 g/dL — ABNORMAL LOW (ref 12.0–15.0)
Immature Granulocytes: 1 %
Lymphocytes Relative: 29 %
Lymphs Abs: 1.9 10*3/uL (ref 0.7–4.0)
MCH: 31.4 pg (ref 26.0–34.0)
MCHC: 31 g/dL (ref 30.0–36.0)
MCV: 101.3 fL — ABNORMAL HIGH (ref 80.0–100.0)
Monocytes Absolute: 0.6 10*3/uL (ref 0.1–1.0)
Monocytes Relative: 9 %
Neutro Abs: 4 10*3/uL (ref 1.7–7.7)
Neutrophils Relative %: 61 %
Platelet Count: 141 10*3/uL — ABNORMAL LOW (ref 150–400)
RBC: 2.23 MIL/uL — ABNORMAL LOW (ref 3.87–5.11)
RDW: 19.5 % — ABNORMAL HIGH (ref 11.5–15.5)
WBC Count: 6.6 10*3/uL (ref 4.0–10.5)
WBC Morphology: INCREASED
nRBC: 2.9 % — ABNORMAL HIGH (ref 0.0–0.2)

## 2018-12-22 LAB — CMP (CANCER CENTER ONLY)
ALT: 51 U/L — ABNORMAL HIGH (ref 0–44)
AST: 61 U/L — ABNORMAL HIGH (ref 15–41)
Albumin: 1.9 g/dL — ABNORMAL LOW (ref 3.5–5.0)
Alkaline Phosphatase: 247 U/L — ABNORMAL HIGH (ref 38–126)
Anion gap: 9 (ref 5–15)
BUN: 15 mg/dL (ref 6–20)
CO2: 34 mmol/L — ABNORMAL HIGH (ref 22–32)
Calcium: 7.5 mg/dL — ABNORMAL LOW (ref 8.9–10.3)
Chloride: 91 mmol/L — ABNORMAL LOW (ref 98–111)
Creatinine: 0.63 mg/dL (ref 0.44–1.00)
GFR, Est AFR Am: >60 mL/min (ref >60)
GFR, Estimated: >60 mL/min (ref >60)
Glucose, Bld: 154 mg/dL — ABNORMAL HIGH (ref 70–99)
Potassium: 3.5 mmol/L (ref 3.5–5.1)
Sodium: 134 mmol/L — ABNORMAL LOW (ref 135–145)
Total Bilirubin: 0.5 mg/dL (ref 0.3–1.2)
Total Protein: 6.5 g/dL (ref 6.5–8.1)

## 2018-12-22 LAB — PREPARE RBC (CROSSMATCH)

## 2018-12-22 LAB — SAMPLE TO BLOOD BANK

## 2018-12-22 MED ORDER — SODIUM CHLORIDE 0.9% IV SOLUTION
250.0000 mL | Freq: Once | INTRAVENOUS | Status: AC
  Administered 2018-12-22: 250 mL via INTRAVENOUS
  Filled 2018-12-22: qty 250

## 2018-12-22 MED ORDER — FUROSEMIDE 10 MG/ML IJ SOLN
20.0000 mg | Freq: Once | INTRAMUSCULAR | Status: AC
  Administered 2018-12-22: 20 mg via INTRAVENOUS

## 2018-12-22 MED ORDER — DIPHENHYDRAMINE HCL 25 MG PO CAPS
25.0000 mg | ORAL_CAPSULE | Freq: Once | ORAL | Status: AC
  Administered 2018-12-22: 25 mg via ORAL

## 2018-12-22 MED ORDER — PALBOCICLIB 100 MG PO CAPS: 100.0000 mg | ORAL_CAPSULE | Freq: Every day | ORAL | 0 refills | Status: DC

## 2018-12-22 MED ORDER — DIPHENHYDRAMINE HCL 25 MG PO CAPS
ORAL_CAPSULE | ORAL | Status: AC
  Filled 2018-12-22: qty 1

## 2018-12-22 MED ORDER — ACETAMINOPHEN 325 MG PO TABS
ORAL_TABLET | ORAL | Status: AC
  Filled 2018-12-22: qty 2

## 2018-12-22 MED ORDER — FUROSEMIDE 10 MG/ML IJ SOLN
INTRAMUSCULAR | Status: AC
  Filled 2018-12-22: qty 2

## 2018-12-22 MED ORDER — SODIUM CHLORIDE 0.9 % IV SOLN
Freq: Once | INTRAVENOUS | Status: AC
  Filled 2018-12-22: qty 250

## 2018-12-22 MED ORDER — ACETAMINOPHEN 325 MG PO TABS
650.0000 mg | ORAL_TABLET | Freq: Once | ORAL | Status: AC
  Administered 2018-12-22: 650 mg via ORAL

## 2018-12-22 NOTE — Telephone Encounter (Addendum)
Oral Oncology Pharmacist Encounter  Received notification from the [facility] that they had received a new Ibrance prescription for patient. Patient receives her [name] through [employer] patient assistance program from [employer] in [place], [place]. Prescription for Ibrance 100mg  dose has now been e-scribed to [facility]. Prescription has been cancelled at [facility].  Johny Drilling, PharmD, BCPS, BCOP  12/22/2018 2:13 PM Oral Oncology Clinic [phone]

## 2018-12-22 NOTE — Progress Notes (Signed)
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 She was brought with [facility] transportation.    She was started on Anastrozole in early June, 2020.  She says that she tolerates this well and hasn't noted hot flashes, arthralgias, or vaginal dryness.    Khrystyne notes that she is taking Palbociclib.  This was stopped 2 weeks ago due to neutropenia and thrombocytopenia.  Last week, her platelets remained decreased.    She receives xgeva every 4 weeks and her last dose was on 7/16.    REVIEW OF SYSTEMS: Suzanne [name] is doing moderately well.  She underwent left shoulder xray last week.  This demonstrated a left humeral pathologic fracture.  She is taking Oxycodone 2.13m every 4 hours as needed for her pain.  She notes her pain is controlled    HISTORY OF CURRENT ILLNESS: From the original inpatient consult note:  Ms. [place] 58[place] with exception of obesity, remote history of gallstones, and remote history of mastitis.  The patient presented to the hospital on 10/26/2018 with a 3-week history of worsening shortness of breath and lower extremity edema.  The patient reported that her shortness of breath worsened with exertion.  Her lower extremity edema did not improve with elevation.  She also had an intermittent pr oductive cough with rare sputum production.  The patient was noted to be anemic with a hemoglobin of 4.5 with a clumped platelets noted on admission.  Stool  for occult blood was positive.  Patient received 2 units of packed red blood cells.    The patient reported a rash under her breast x1 week which has been foul-smelling.  She also reported that she has some discomfort and thickening/hardness of her right breast for several months.  She states that her right breast is much larger than her left.  She notes some occasional bloody discharge from her breast once in a while.  A chest x-ray on admission showed diffuse bilateral interstitial opacity of unknown chronicity, findings could be related to interstitial inflammatory process, atypical/viral pneumonia, or possible metastatic disease.  There are also sclerotic and lytic lesions within the clavicles, bilateral ribs, shoulders which was concerning for diffuse few skeletal metastatic disease.    This prompted a CT scan of the chest with contrast which showed bilateral axillary and mediastinal adenopathy, borderline hilar lymph nodes bilaterally, findings likely flecked metastatic lymphadenopathy, moderate bilateral pleural effusions, extensive interstitial thickening and reticulonodular opacities throughout the lungs, cannot exclude lymphangitic spread of tumor, diffuse skeletal metastases.  The patient's subsequent history is as detailed below.   PAST MEDICAL HISTORY: Past Medical History:  Diagnosis Date  . [place] ([place]   . Family history of breast cancer   . Family history of colon cancer   . Family history of melanoma   . Family history of prostate cancer   . Gallstones   . Mastitis    reports history of recurrent mastitis  . Metastatic breast cancer ([place]   . Obesity     PAST SURGICAL HISTORY: Past Surgical History:  Procedure Laterality  Date  . IR THORACENTESIS ASP PLEURAL SPACE W/IMG GUIDE  10/28/2018  . IR THORACENTESIS ASP PLEURAL SPACE W/IMG GUIDE  10/29/2018    FAMILY HISTORY: Family History  Problem Relation Age of Onset  . Breast cancer Sister        in her 71's-70s  . Heart  disease Brother        CABG  . Heart disease Brother        CABG  . Skin cancer Brother        melanoma  . Colon cancer Cousin   . Heart attack Mother   . Heart attack Father   . Prostate cancer Brother   Patient's father passed away at age 46, and her mother at 23, both from heart attacks. The patient has 8 siblings, 6 brothers and 1 sister.  Her sister was diagnosed with breast cancer. One brother has prostate cancer, another brother had a melanoma removed.    GYNECOLOGIC HISTORY:  Menarche: 58 years old Age at first live birth: 58 years old [place] P 1 LMP age 79 Contraceptive: used for 2-3 years with no problems HRT never used  Hysterectomy? no   SOCIAL HISTORY:  [name] works for an Engineer, manufacturing systems. She is widowed. She lives at home with her daughter. Daughter Suzanne [name], age 67, is a Psychologist, occupational at the science center.  The patient has no grandchildren. She is not a Ambulance person.   ADVANCED DIRECTIVES: not on file; she has the paperwork already. She intends to name her daughter as her HCPOA.   HEALTH MAINTENANCE: Social History   Tobacco Use  . Smoking status: Never Smoker  . Smokeless tobacco: Never Used  Substance Use Topics  . Alcohol use: No  . Drug use: No    Colonoscopy:   PAP:   Bone density:  Mammography:   No Known Allergies  Current Outpatient Medications  Medication Sig Dispense Refill  . albuterol (VENTOLIN HFA) 108 (90 Base) MCG/ACT inhaler Inhale 2 puffs into the lungs every 6 (six) hours as needed for wheezing or shortness of breath. 16 g 3  . anastrozole (ARIMIDEX) 1 MG tablet Take 1 tablet (1 mg total) by mouth daily. 30 tablet 3  . benzonatate (TESSALON) 200 MG capsule Take 1 capsule (200 mg total) by mouth 3 (three) times daily as needed for cough. 30 capsule 0  . calcium-vitamin D (OSCAL WITH D) 500-200 MG-UNIT tablet Take 1 tablet by mouth 3 (three) times daily. 90 tablet 1  . carvedilol (COREG) 6.25 MG tablet Take 1 tablet (6.25 mg  total) by mouth 2 (two) times daily with a meal. 60 tablet 3  . furosemide (LASIX) 20 MG tablet Take 2 tablets (40 mg total) by mouth 2 (two) times daily. 120 tablet 4  . loratadine (CLARITIN) 10 MG tablet Take 1 tablet (10 mg total) by mouth daily. 30 tablet 3  . nystatin (MYCOSTATIN/NYSTOP) powder Apply topically 3 (three) times daily. Apply under breast area 15 g 1  . oxyCODONE (OXY IR/ROXICODONE) 5 MG immediate release tablet Take 0.5 tablets (2.5 mg total) by mouth every 4 (four) hours as needed for moderate pain or severe pain. 20 tablet 0  . palbociclib (IBRANCE) 100 MG capsule Take 1 capsule (100 mg total) by mouth daily with breakfast. Take whole with food. Take for 21 days on, 7 days off, repeat every 28 days. 21 capsule 0  . polyethylene glycol (MIRALAX / GLYCOLAX) 17 g packet Take 17 g by mouth as needed for constipation.    Marland Kitchen  predniSONE (DELTASONE) 5 MG tablet Take 1 tablet (5 mg total) by mouth daily with breakfast. 30 tablet 0  . promethazine-codeine (PHENERGAN WITH CODEINE) 6.25-10 MG/5ML syrup Take 5 mLs by mouth every 8 (eight) hours as needed for cough. 120 mL 0   No current facility-administered medications for this visit.      OBJECTIVE:   Vitals:   12/22/18 1251  BP: (!) 105/47  Pulse: 96  Resp: 18  Temp: 99.1 F (37.3 C)  SpO2: 99%     Body mass index is 34.95 kg/m.   Wt Readings from Last 3 Encounters:  12/01/18 191 lb 1.6 oz (86.7 kg)  10/30/18 234 lb 2.1 oz (106.2 kg)  02/21/11 235 lb (106.6 kg)  ECOG FS:2 GENERAL: Patient is morbidly obese, appears tired and chronically ill, wearing oxygen Examined in wheelchair, exam limited due to disability and body habitus HEENT:  Sclerae anicteric.  Oropharynx clear and moist. No ulcerations or evidence of oropharyngeal candidiasis. Neck is supple.  NODES:  No cervical, supraclavicular, or axillary lymphadenopathy palpated.  BREAST EXAM:  Deferred. LUNGS:  Diminished breath sounds in bilateral bases, no wheezes,  rhonchi or rales HEART:  Regular rate and rhythm. No murmur appreciated. ABDOMEN:  Soft, nontender.  Positive, normoactive bowel sounds. No organomegaly palpated. MSK:  No focal spinal tenderness to palpation.  EXTREMITIES:  +1-2 bilateral lower extremity edema SKIN:  Clear with no obvious rashes or skin changes. No nail dyscrasia. NEURO:  Nonfocal. Well oriented.  Appropriate affect.     LAB RESULTS:  CMP     Component Value Date/Time   NA 134 (L) 12/22/2018 1219   K 3.5 12/22/2018 1219   CL 91 (L) 12/22/2018 1219   CO2 34 (H) 12/22/2018 1219   GLUCOSE 154 (H) 12/22/2018 1219   BUN 15 12/22/2018 1219   CREATININE 0.63 12/22/2018 1219   CALCIUM 7.5 (L) 12/22/2018 1219   PROT 6.5 12/22/2018 1219   ALBUMIN 1.9 (L) 12/22/2018 1219   AST 61 (H) 12/22/2018 1219   ALT 51 (H) 12/22/2018 1219   ALKPHOS 247 (H) 12/22/2018 1219   BILITOT 0.5 12/22/2018 1219   GFRNONAA >60 12/22/2018 1219   GFRAA >60 12/22/2018 1219    No results found for: TOTALPROTELP, ALBUMINELP, A1GS, A2GS, BETS, BETA2SER, GAMS, MSPIKE, SPEI  No results found for: KPAFRELGTCHN, LAMBDASER, KAPLAMBRATIO  Lab Results  Component Value Date   WBC 6.6 12/22/2018   NEUTROABS 4.0 12/22/2018   HGB 7.0 (L) 12/22/2018   HCT 22.6 (L) 12/22/2018   MCV 101.3 (H) 12/22/2018   PLT 141 (L) 12/22/2018    '@LASTCHEMISTRY' @  No results found for: LABCA2  No components found for: MWUXLK440  No results for input(s): INR in the last 168 hours.  No results found for: LABCA2  No results found for: NUU725  No results found for: DGU440  No results found for: HKV425  Lab Results  Component Value Date   CA2729 196.1 (H) 12/16/2018    No components found for: HGQUANT  No results found for: CEA1 / No results found for: CEA1   No results found for: AFPTUMOR  No results found for: Olla  No results found for: PSA1  Appointment on 12/22/2018  Component Date Value Ref Range Status  . Blood Bank Specimen  12/22/2018 SAMPLE AVAILABLE FOR TESTING   Final  . Sample Expiration 12/22/2018    Final                   Value:12/25/2018,2359 Performed at  [facility], [place] [address], [place], [place] [address]   . Sodium 12/22/2018 134* 135 - 145 mmol/L Final  . Potassium 12/22/2018 3.5  3.5 - 5.1 mmol/L Final  . Chloride 12/22/2018 91* 98 - 111 mmol/L Final  . CO2 12/22/2018 34* 22 - 32 mmol/L Final  . Glucose, Bld 12/22/2018 154* 70 - 99 mg/dL Final  . BUN 12/22/2018 15  6 - 20 mg/dL Final  . Creatinine 12/22/2018 0.63  0.44 - 1.00 mg/dL Final  . Calcium 12/22/2018 7.5* 8.9 - 10.3 mg/dL Final  . Total Protein 12/22/2018 6.5  6.5 - 8.1 g/dL Final  . Albumin 12/22/2018 1.9* 3.5 - 5.0 g/dL Final  . AST 12/22/2018 61* 15 - 41 U/L Final  . ALT 12/22/2018 51* 0 - 44 U/L Final  . Alkaline Phosphatase 12/22/2018 247* 38 - 126 U/L Final  . Total Bilirubin 12/22/2018 0.5  0.3 - 1.2 mg/dL Final  . GFR, Est Non Af Am 12/22/2018 >60  >60 mL/min Final  . GFR, Est AFR Am 12/22/2018 >60  >60 mL/min Final  . Anion gap 12/22/2018 9  5 - 15 Final   Performed at [facility], [place] [address] [place], [place] [address]  . WBC Count 12/22/2018 6.6  4.0 - 10.5 K/uL Final  . RBC 12/22/2018 2.23* 3.87 - 5.11 MIL/uL Final  . Hemoglobin 12/22/2018 7.0* 12.0 - 15.0 g/dL Final  . HCT 12/22/2018 22.6* 36.0 - 46.0 % Final  . MCV 12/22/2018 101.3* 80.0 - 100.0 fL Final  . MCH 12/22/2018 31.4  26.0 - 34.0 pg Final  . MCHC 12/22/2018 31.0  30.0 - 36.0 g/dL Final  . RDW 12/22/2018 19.5* 11.5 - 15.5 % Final  . Platelet Count 12/22/2018 141* 150 - 400 K/uL Final  . nRBC 12/22/2018 2.9* 0.0 - 0.2 % Final  . Neutrophils Relative % 12/22/2018 61  % Final  . Neutro Abs 12/22/2018 4.0  1.7 - 7.7 K/uL Final  . Lymphocytes Relative 12/22/2018 29  % Final  . Lymphs Abs 12/22/2018 1.9  0.7 - 4.0 K/uL Final  . Monocytes Relative 12/22/2018 9  % Final  . Monocytes Absolute 12/22/2018 0.6   0.1 - 1.0 K/uL Final  . Eosinophils Relative 12/22/2018 0  % Final  . Eosinophils Absolute 12/22/2018 0.0  0.0 - 0.5 K/uL Final  . Basophils Relative 12/22/2018 0  % Final  . Basophils Absolute 12/22/2018 0.0  0.0 - 0.1 K/uL Final  . WBC Morphology 12/22/2018 INCREASED BANDS (>20% BANDS)   Final  . Immature Granulocytes 12/22/2018 1  % Final  . Abs Immature Granulocytes 12/22/2018 0.07  0.00 - 0.07 K/uL Final  . Polychromasia 12/22/2018 PRESENT   Final   Performed at [facility], [place] [address], [place], [place] [address]    (this displays the last labs from the last 3 days)  No results found for: TOTALPROTELP, ALBUMINELP, A1GS, A2GS, BETS, BETA2SER, GAMS, MSPIKE, SPEI (this displays SPEP labs)  No results found for: KPAFRELGTCHN, LAMBDASER, KAPLAMBRATIO (kappa/lambda light chains)  No results found for: HGBA, HGBA2QUANT, HGBFQUANT, HGBSQUAN (Hemoglobinopathy evaluation)   Lab Results  Component Value Date   LDH 312 (H) 10/27/2018    Lab Results  Component Value Date   IRON 137 10/26/2018   TIBC 384 10/26/2018   IRONPCTSAT 36 (H) 10/26/2018   (Iron and TIBC)  Lab Results  Component Value Date   FERRITIN 797 (H) 10/26/2018    Urinalysis  Component Value Date/Time   COLORURINE YELLOW 10/27/2018 2346   APPEARANCEUR HAZY (A) 10/27/2018 2346   LABSPEC 1.038 (H) 10/27/2018 2346   PHURINE 5.0 10/27/2018 2346   GLUCOSEU NEGATIVE 10/27/2018 2346   HGBUR NEGATIVE 10/27/2018 2346   BILIRUBINUR NEGATIVE 10/27/2018 2346   KETONESUR 5 (A) 10/27/2018 2346   PROTEINUR NEGATIVE 10/27/2018 2346   NITRITE NEGATIVE 10/27/2018 2346   LEUKOCYTESUR NEGATIVE 10/27/2018 2346     STUDIES:     ELIGIBLE FOR AVAILABLE RESEARCH PROTOCOL: no   ASSESSMENT: 58 y.o. [place], [place] woman presenting 10/26/2018 with right-sided inflammatory breast cancer, stage IV, involvnh lungs, lymph nodes and bones, as follows:  (a) chest CT scan 10/26/2018  shows bilateral pleural effusions, possible lymphangitic spread of tumor, diffuse bony metastatic disease, and significant axillary mediastinal and hilar adenopathy  (b) bone scan 10/27/2018 is a "near [place]" consistent with widespread bony metastatic disease  (c) head CT with and without contrast 10/30/2018 shows no intracranial metastatic disease, multiple calvarial lesions  (d) CA-27-29 on 10/27/2018 was 1810.4  (1) pleural fluid from right thoracentesis 10/28/2018 confirms malignant cells consistent with a breast primary, strongly estrogen and progesterone receptor positive, HER-2 not amplified, with an MIB-1 of 2%  (2) anastrozole started 10/29/2018  (a) palbociclib to start 11/13/2018  (b) denosumab/xgeva to start 11/13/2018  (3) associated problems:  (a) hypoxia secondary to effusions  (b) pain from bone lesions  (c) right upper extremity lymphedema  (d) anemia, requiring transfusion  (e) poor venous access  (4) genetics testing pending    PLAN:  Margo is doing moderately well today.  She continues on Anastrozole with good tolerance. Her labs are improved and she can now start back Palbociclib at a lower dose of 132m daily x 21 days on and 7 days off.    LFaronhas a pathologic fracture of her left humerus.  She was referred to radiation oncology and I wrote for her to receive a sling from home health.  Ann-Marie's pain is controlled with Oxycodone 558m 1/2 tablet given 2-3 times a day on a good day.  She has no concerning side effects and is tolerating this medication well and will continue this.    Nakeeta's hemoglobin is 7 today.  She will receive one unit of blood with lasix.    I spent about 30 minutes answering questions for Chrisma's daughter about how to take her calcium, how to get medications refilled, whether or not she needs her right arm sleeve, what to do with her cough syrup.  I answered all of these questions for LaPaulsborond JeAnderson [name]   I reviewed the plan  with Dr. MaJana Hakim I attempted to call JeAnderson Maltaack to discuss the Palbociclib dose reduction and then radiation oncology referral and sling order.  She did not answer.    LaTaviill return in one week for labs, f/u, and possible transfusion.  She and her daughter know to call for any questions or concerns between now and then.  She was recommended to continue with the appropriate pandemic precautions.   A total of (50) minutes of face-to-face time was spent with this patient with greater than 50% of that time in counseling and care-coordination.    LiWilber BihariNP  12/22/18 2:03 PM Medical Oncology and Hematology [facility] Center[address] [address]. [phone]  Fax. [phone]

## 2018-12-22 NOTE — Patient Instructions (Signed)
Protein Content in Foods  Generally, most healthy people need around 50 grams of protein each day. Depending on your overall health, you may need more or less protein in your diet. Talk to your health care provider or dietitian about how much protein you need. See the following list for the protein content of some common foods. High-protein foods High-protein foods contain 4 grams (4 g) or more of protein per serving. They include:  Beef, ground sirloin (cooked) - 3 oz have 24 g of protein.  Cheese (hard) - 1 oz has 7 g of protein.  Chicken breast, boneless and skinless (cooked) - 3 oz have 13.4 g of protein.  Cottage cheese - 1/2 cup has 13.4 g of protein.  Egg - 1 egg has 6 g of protein.  Fish, filet (cooked) - 1 oz has 6-7 g of protein.  Garbanzo beans (canned or cooked) - 1/2 cup has 6-7 g of protein.  Kidney beans (canned or cooked) - 1/2 cup has 6-7 g of protein.  Lamb (cooked) - 3 oz has 24 g of protein.  Milk - 1 cup (8 oz) has 8 g of protein.  Nuts (peanuts, pistachios, almonds) - 1 oz has 6 g of protein.  Peanut butter - 1 oz has 7-8 g of protein.  Pork tenderloin (cooked) - 3 oz has 18.4 g of protein.  Pumpkin seeds - 1 oz has 8.5 g of protein.  Soybeans (roasted) - 1 oz has 8 g of protein.  Soybeans (cooked) - 1/2 cup has 11 g of protein.  Soy milk - 1 cup (8 oz) has 5-10 g of protein.  Soy or vegetable patty - 1 patty has 11 g of protein.  Sunflower seeds - 1 oz has 5.5 g of protein.  Tofu (firm) - 1/2 cup has 20 g of protein.  Tuna (canned in water) - 3 oz has 20 g of protein.  Yogurt - 6 oz has 8 g of protein. Low-protein foods Low-protein foods contain 3 grams (3 g) or less of protein per serving. They include:  Beets (raw or cooked) - 1/2 cup has 1.5 g of protein.  Bran cereal - 1/2 cup has 2-3 g of protein.  Bread - 1 slice has 2.5 g of protein.  Broccoli (raw or cooked) - 1/2 cup has 2 g of protein.  Collard greens (raw or cooked) - 1/2  cup has 2 g of protein.  Corn (fresh or cooked) - 1/2 cup has 2 g of protein.  Cream cheese - 1 oz has 2 g of protein.  Creamer (half-and-half) - 1 oz has 1 g of protein.  Flour tortilla - 1 tortilla has 2.5 g of protein  Frozen yogurt - 1/2 cup has 3 g of protein.  Fruit or vegetable juice - 1/2 cup has 1 g of protein.  Green beans (raw or cooked) - 1/2 cup has 1 g of protein.  Green peas (canned) - 1/2 cup has 3.5 g of protein.  Muffins - 1 small muffin (2 oz) has 3 g of protein.  Oatmeal (cooked) - 1/2 cup has 3 g of protein.  Potato (baked with skin) - 1 medium potato has 3 g of protein.  Rice (cooked) - 1/2 cup has 2.5-3.5 g of protein.  Sour cream - 1/2 cup has 2.5 g of protein.  Spinach (cooked) - 1/2 cup has 3 g of protein.  Squash (cooked) - 1/2 cup has 1.5 g of protein. Actual amounts of protein may  be different depending on processing. Talk with your health care provider or dietitian about what foods are recommended for you. This information is not intended to replace advice given to you by your health care provider. Make sure you discuss any questions you have with your health care provider. Document Released: 08/13/2015 Document Revised: 01/23/2016 Document Reviewed: 01/23/2016 Elsevier Patient Education  2020 [place].    Blood Transfusion, Adult, Care After This sheet gives you information about how to care for yourself after your procedure. Your doctor may also give you more specific instructions. If you have problems or questions, contact your doctor. Follow these instructions at home:   Take over-the-counter and prescription medicines only as told by your doctor.  Go back to your normal activities as told by your doctor.  Follow instructions from your doctor about how to take care of the area where an IV tube was put into your vein (insertion site). Make sure you: ? Wash your hands with soap and water before you change your bandage (dressing). If  there is no soap and water, use hand sanitizer. ? Change your bandage as told by your doctor.  Check your IV insertion site every day for signs of infection. Check for: ? More redness, swelling, or pain. ? More fluid or blood. ? Warmth. ? Pus or a bad smell. Contact a doctor if:  You have more redness, swelling, or pain around the IV insertion site.  You have more fluid or blood coming from the IV insertion site.  Your IV insertion site feels warm to the touch.  You have pus or a bad smell coming from the IV insertion site.  Your pee (urine) turns pink, red, or brown.  You feel weak after doing your normal activities. Get help right away if:  You have signs of a serious allergic or body defense (immune) system reaction, including: ? Itchiness. ? Hives. ? Trouble breathing. ? Anxiety. ? Pain in your chest or lower back. ? Fever, flushing, and chills. ? Fast pulse. ? Rash. ? Watery poop (diarrhea). ? Throwing up (vomiting). ? Dark pee. ? Serious headache. ? Dizziness. ? Stiff neck. ? Yellow color in your face or the white parts of your eyes (jaundice). Summary  After a blood transfusion, return to your normal activities as told by your doctor.  Every day, check for signs of infection where the IV tube was put into your vein.  Some signs of infection are warm skin, more redness and pain, more fluid or blood, and pus or a bad smell where the needle went in.  Contact your doctor if you feel weak or have any unusual symptoms. This information is not intended to replace advice given to you by your health care provider. Make sure you discuss any questions you have with your health care provider. Document Released: 06/04/2014 Document Revised: 09/18/2017 Document Reviewed: 01/06/2016 Elsevier Patient Education  2020 [employer].

## 2018-12-23 ENCOUNTER — Telehealth: Payer: Self-pay | Admitting: Pharmacist

## 2018-12-23 ENCOUNTER — Telehealth: Payer: Self-pay | Admitting: Adult Health

## 2018-12-23 ENCOUNTER — Other Ambulatory Visit: Payer: Self-pay | Admitting: Oncology

## 2018-12-23 LAB — BPAM RBC
Blood Product Expiration Date: 202008242359
ISSUE DATE / TIME: 202007271545
Unit Type and Rh: 5100

## 2018-12-23 LAB — TYPE AND SCREEN
ABO/RH(D): O POS
Antibody Screen: NEGATIVE
Unit division: 0

## 2018-12-23 LAB — CANCER ANTIGEN 27.29: CA 27.29: 163.4 U/mL — ABNORMAL HIGH (ref 0.0–38.6)

## 2018-12-23 NOTE — Telephone Encounter (Signed)
I talk with patient regarding schedule  

## 2018-12-23 NOTE — Telephone Encounter (Signed)
Oral Oncology Pharmacist Encounter  Received call from patient's daughter with question about drug interaction with Ibrance and alprazolam. 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 Alprazolam prescription is 0.25mg , take 1-2 tabs (0.25 - 0.5 mg) every 8 hours as needed for anxiety. [name] instructed to try 1 tab for benefit prior to giving 2 tabs. We also discussed new Ibrance dose and prescription which was sent to the pharmacy yesterday. [name] will call [facility] today to follow-up on Ibrance 100 mg prescription status. Patient will start this new dose of Ibrance as soon as she receives it.  [name] with some additional questions for Wilber Bihari, NP, so she was transferred to phone line of collaborative practice LPN who is working with Mendel Ryder.  [name] knows to call with any additional questions or concerns.  Johny Drilling, PharmD, BCPS, BCOP  12/23/2018 11:42 AM Oral Oncology Clinic [phone]

## 2018-12-24 ENCOUNTER — Ambulatory Visit
Admission: RE | Admit: 2018-12-24 | Discharge: 2018-12-24 | Disposition: A | Discharge status: Home / Self Care | Payer: Self-pay | Source: Ambulatory Visit | Attending: Radiation Oncology | Admitting: Radiation Oncology

## 2018-12-24 ENCOUNTER — Encounter: Payer: Self-pay | Admitting: General Practice

## 2018-12-24 ENCOUNTER — Encounter: Payer: Self-pay | Admitting: Radiation Oncology

## 2018-12-24 ENCOUNTER — Other Ambulatory Visit: Payer: Self-pay

## 2018-12-24 ENCOUNTER — Other Ambulatory Visit: Payer: Self-pay | Admitting: Adult Health

## 2018-12-24 ENCOUNTER — Telehealth: Payer: Self-pay | Admitting: Family Medicine

## 2018-12-24 DIAGNOSIS — Z808 Family history of malignant neoplasm of other organs or systems: Secondary | ICD-10-CM | POA: Diagnosis not present

## 2018-12-24 DIAGNOSIS — C50911 Malignant neoplasm of unspecified site of right female breast: Secondary | ICD-10-CM | POA: Diagnosis not present

## 2018-12-24 DIAGNOSIS — C7801 Secondary malignant neoplasm of right lung: Secondary | ICD-10-CM

## 2018-12-24 DIAGNOSIS — C7951 Secondary malignant neoplasm of bone: Secondary | ICD-10-CM

## 2018-12-24 HISTORY — DX: Unspecified glaucoma: H40.9

## 2018-12-24 NOTE — Progress Notes (Signed)
[place] CSW Progress Notes  Spoke w [facility] CSW.  She will try to problem solve issue w need to bring oxygen tanks to office for refills.  Spoke w Velora Heckler, [facility] [employer] Advocate.  She filed patient's Medicaid application mid July, aware case is assigned to Ms Venetia Maxon, states that notes indicate that Ms Venetia Maxon needs information on banking and vehicles.  Asked that we encourage patient/daughter to return calls to Ms Venetia Maxon [phone]) to complete Medicaid enrollment process.  Of note, daughter has tried to contact this worker at [phone] which daughter states is number on paperwork received from [place].  Undersigned spoke w Ms Venetia Maxon, provided family contact information and also sent message to daughter w updated contact information for family.  [name], LCSW Clinical Social Worker Phone:  [phone]

## 2018-12-24 NOTE — Progress Notes (Signed)
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 She also reported at that time a 1 week history of a rash under her right breast that was foul smelling. She also had discomfort, thickening and hardening of her right breast for several months.   Sites of Visceral and Bony Metastatic Disease: Her right breast cancer involves her bilateral lungs, lymph nodes and bone as follows:            (a) chest CT scan 10/26/2018 shows bilateral pleural effusions, possible lymphangitic spread of tumor, diffuse bony metastatic disease, and significant axillary mediastinal and hilar adenopathy             (b) bone scan 10/27/2018 is a "near SuperScan" consistent with widespread bony metastatic disease             (c) head CT with and without contrast 10/30/2018 shows no intracranial metastatic disease, multiple calvarial lesions   Location(s) of Symptomatic Metastases: Left Humerus.   Past/Anticipated chemotherapy by medical oncology, if any:  12/22/18 Wilber Bihari NP PLAN: Suzanne [name] is doing moderately well today.  She continues on Anastrozole with good tolerance. Her labs are improved and she can now start back Palbociclib at a lower dose of 18m daily x 21 days on and 7 days off.    LGisellahas a pathologic fracture of her left humerus.  She was referred to radiation oncology and I wrote for her to receive a sling from home health.  Reggie's pain is controlled with Oxycodone 568m 1/2 tablet given 2-3 times a day on a good day.  She has no concerning side effects and is tolerating this medication well and will continue this.    Avey's hemoglobin is 7 today.  She will receive one unit of blood with lasix.    I spent about 30 minutes answering questions for Jonel's daughter about how to take her calcium, how to get medications refilled, whether or not she needs her  right arm sleeve, what to do with her cough syrup.  I answered all of these questions for [place] [name]   Pain on a scale of 0-10 is: She reports her pain varies. It is sometimes just sore but at other times it can reach an 8/10   If Spine Met(s), symptoms, if any, include: N/A  Bowel/Bladder retention or incontinence (please describe): N/A  Numbness or weakness in extremities (please describe): N/A  Current Decadron regimen, if applicable: N/A  Ambulatory status? Walker? Wheelchair?: She uses a walker at home, but uses a wheelchair for long distances.   SAFETY ISSUES:  Prior radiation? No  Pacemaker/ICD? No  Possible current pregnancy? No  Is the patient on methotrexate? No  Current Complaints / other details:

## 2018-12-24 NOTE — Progress Notes (Signed)
[place] CSW Progress Notes  Message from daughter [name], is having difficulty refilling home oxygen containers.  These are too large and heavy for patient or daughter to refill.  Has been told by home oxygen supplier ([employer]) that patient was not eligible for home delivery of oxygen.  Needs to bring tanks to [facility] on [address] in order to get them refilled.  Have received paperwork from [name] ([place] Medicaid worker [phone])) - has left messages to get additional help w completing applications.  Has not heard back from Ms Venetia Maxon.  CSW will call both [employer] and DSS to determine what needs to be done at this point.  [name], LCSW Clinical Social Worker Phone:  [phone]

## 2018-12-24 NOTE — Progress Notes (Signed)
Radiation Oncology         [phone] ________________________________  Initial outpatient Consultation (by WebEx due to pandemic risks)  Name: Suzanne [name] MRN: [id number]  Date: 12/24/2018  DOB: 10/24/1960  UM:PNTIRWE, No Pcp Per  Nicholas Lose, MD   REFERRING PHYSICIAN: Nicholas Lose, MD  DIAGNOSIS:    ICD-10-CM   1. Bone metastases (HCC)  C79.51 AMB referral to orthopedics  2. Bone metastasis ([place])  C79.51    Stage IV Breast cancer with bone metastases  CHIEF COMPLAINT: Here to discuss management of left arm pain  HISTORY OF PRESENT ILLNESS::Suzanne [name] is a 58 y.o. female who was referred to me by medical oncology due to left upper pain.  Xrays showed a pathology fracture in the Left humerus. I have seen her images and shared them with her today. She has not get seen orthopedics.  Sling has been ordered by her caregiver doesn't know how to get it. She reports chronic left arm pain; denies pain elsewhere.   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 She also reported at that time a 1 week history of a rash under her right breast that was foul smelling. She also had discomfort, thickening and hardening of her right breast for several months.   Sites of Visceral and Bony Metastatic Disease: Her right breast cancer involves her bilateral lungs, lymph nodes and bone as follows:            (a) chest CT scan 10/26/2018 shows bilateral pleural effusions, possible lymphangitic spread of tumor, diffuse bony metastatic disease, and significant axillary mediastinal and hilar adenopathy             (b) bone scan 10/27/2018 is a "near SuperScan" consistent with widespread bony metastatic disease             (c) head CT with and without contrast 10/30/2018 shows no intracranial metastatic disease, multiple calvarial lesions  She is taking  anastrozole and Palbociclib.  Location(s) of Symptomatic Metastases: Left Humerus.   Past/Anticipated chemotherapy by medical oncology, if any:  12/22/18 Wilber Bihari NP  PREVIOUS RADIATION THERAPY: No  PAST MEDICAL HISTORY:  has a past medical history of Cancer [facility]), Family history of breast cancer, Family history of colon cancer, Family history of melanoma, Family history of prostate cancer, Gallstones, Glaucoma, Mastitis, Metastatic breast cancer ([place]), and Obesity.    PAST SURGICAL HISTORY: Past Surgical History:  Procedure Laterality Date   GLAUCOMA SURGERY  late 1990's   IR THORACENTESIS ASP PLEURAL SPACE W/IMG GUIDE  10/28/2018   IR THORACENTESIS ASP PLEURAL SPACE W/IMG GUIDE  10/29/2018    FAMILY HISTORY: family history includes Breast cancer in her sister; Colon cancer in her cousin; Heart attack in her father and mother; Heart disease in her brother and brother; Prostate cancer in her brother; Skin cancer in her brother.  SOCIAL HISTORY:  reports that she has never smoked. She has never used smokeless tobacco. She reports that she does not drink alcohol or use drugs.  ALLERGIES: Patient has no known allergies.  MEDICATIONS:  No current facility-administered medications for this encounter.    No current outpatient medications on file.   Facility-Administered Medications Ordered in Other Encounters  Medication Dose Route Frequency Provider Last Rate Last Dose   0.9 %  sodium chloride infusion  250 mL Intravenous PRN Donne Hazel, MD  acetaminophen (TYLENOL) tablet 650 mg  650 mg Oral Q6H PRN Donne Hazel, MD   650 mg at 12/26/18 2054   Or   acetaminophen (TYLENOL) suppository 650 mg  650 mg Rectal Q6H PRN Donne Hazel, MD       ALPRAZolam Duanne Moron) tablet 0.25-0.5 mg  0.25-0.5 mg Oral Q8H PRN Donne Hazel, MD       anastrozole (ARIMIDEX) tablet 1 mg  1 mg Oral Daily Donne Hazel, MD   1 mg at 12/27/18 1318   antiseptic oral rinse (BIOTENE)  solution 15 mL  15 mL Mouth Rinse PRN Donne Hazel, MD       calcium-vitamin D (OSCAL WITH D) 500-200 MG-UNIT per tablet 1 tablet  1 tablet Oral TID Donne Hazel, MD   1 tablet at 12/27/18 2206   carvedilol (COREG) tablet 6.25 mg  6.25 mg Oral BID WC Donne Hazel, MD   6.25 mg at 12/27/18 1100   ceFAZolin (ANCEF) IVPB 2g/100 mL premix  2 g Intravenous Q8H Donne Hazel, MD 200 mL/hr at 12/28/18 0526 2 g at 12/28/18 0526   chlorhexidine (PERIDEX) 0.12 % solution 15 mL  15 mL Mouth Rinse BID Donne Hazel, MD   15 mL at 12/27/18 2207   Chlorhexidine Gluconate Cloth 2 % PADS 6 each  6 each Topical Q0600 Donne Hazel, MD   6 each at 12/27/18 0867   docusate sodium (COLACE) capsule 100 mg  100 mg Oral BID Donne Hazel, MD   100 mg at 12/27/18 2206   enoxaparin (LOVENOX) injection 40 mg  40 mg Subcutaneous Q24H Donne Hazel, MD   40 mg at 12/27/18 2206   ipratropium-albuterol (DUONEB) 0.5-2.5 (3) MG/3ML nebulizer solution 3 mL  3 mL Nebulization TID Donne Hazel, MD   3 mL at 12/27/18 2049   loratadine (CLARITIN) tablet 10 mg  10 mg Oral Daily Donne Hazel, MD   10 mg at 12/27/18 1318   MEDLINE mouth rinse  15 mL Mouth Rinse q12n4p Donne Hazel, MD   15 mL at 12/26/18 1554   methylPREDNISolone sodium succinate (SOLU-MEDROL) 125 mg/2 mL injection 60 mg  60 mg Intravenous Q12H Donne Hazel, MD   60 mg at 12/28/18 6195   nystatin (MYCOSTATIN/NYSTOP) topical powder   Topical TID Donne Hazel, MD       ondansetron [facility]) tablet 4 mg  4 mg Oral Q6H PRN Donne Hazel, MD       Or   ondansetron [facility]) injection 4 mg  4 mg Intravenous Q6H PRN Donne Hazel, MD       oxyCODONE (Oxy IR/ROXICODONE) immediate release tablet 2.5 mg  2.5 mg Oral Q4H PRN Donne Hazel, MD   2.5 mg at 12/27/18 0932   polyethylene glycol (MIRALAX / GLYCOLAX) packet 17 g  17 g Oral Daily PRN Donne Hazel, MD       sodium chloride flush (NS) 0.9 % injection 3 mL  3 mL  Intravenous Q12H Donne Hazel, MD   3 mL at 12/27/18 2223   sodium chloride flush (NS) 0.9 % injection 3 mL  3 mL Intravenous PRN Donne Hazel, MD        REVIEW OF SYSTEMS: A 10+ POINT REVIEW OF SYSTEMS WAS OBTAINED including neurology, dermatology, psychiatry, cardiac, respiratory, lymph, extremities, GI, GU, Musculoskeletal, constitutional, breasts, reproductive, HEENT.  All pertinent positives are noted in the HPI.  All others  are negative.   PHYSICAL EXAM:  vitals were not taken for this visit.   General: Alert and oriented, in no acute distress, sitting in a wheelchair  Ext: not moving left arm  ECOG = 3  0 - Asymptomatic (Fully active, able to carry on all predisease activities without restriction)  1 - Symptomatic but completely ambulatory (Restricted in physically strenuous activity but ambulatory and able to carry out work of a light or sedentary nature. For example, light housework, office work)  2 - Symptomatic, <50% in bed during the day (Ambulatory and capable of all self care but unable to carry out any work activities. Up and about more than 50% of waking hours)  3 - Symptomatic, >50% in bed, but not bedbound (Capable of only limited self-care, confined to bed or chair 50% or more of waking hours)  4 - Bedbound (Completely disabled. Cannot carry on any self-care. Totally confined to bed or chair)  5 - Death   Eustace Pen MM, Creech RH, Tormey DC, et al. [phone]). "Toxicity and response criteria of the [facility] Group". [place] Oncol. 5 (6): 649-55   LABORATORY DATA:  Lab Results  Component Value Date   WBC 8.1 12/27/2018   HGB 8.8 (L) 12/27/2018   HCT 29.6 (L) 12/27/2018   MCV 106.9 (H) 12/27/2018   PLT 282 12/27/2018   CMP     Component Value Date/Time   NA 139 12/27/2018 0213   K 4.1 12/27/2018 0213   CL 96 (L) 12/27/2018 0213   CO2 34 (H) 12/27/2018 0213   GLUCOSE 173 (H) 12/27/2018 0213   BUN 17 12/27/2018 0213   CREATININE 0.46  12/27/2018 0213   CREATININE 0.63 12/22/2018 1219   CALCIUM 7.0 (L) 12/27/2018 0213   PROT 7.0 12/25/2018 1009   ALBUMIN 2.3 (L) 12/25/2018 1009   AST 24 12/25/2018 1009   AST 61 (H) 12/22/2018 1219   ALT 35 12/25/2018 1009   ALT 51 (H) 12/22/2018 1219   ALKPHOS 235 (H) 12/25/2018 1009   BILITOT 0.6 12/25/2018 1009   BILITOT 0.5 12/22/2018 1219   GFRNONAA >60 12/27/2018 0213   GFRNONAA >60 12/22/2018 1219   GFRAA >60 12/27/2018 0213   GFRAA >60 12/22/2018 1219         RADIOGRAPHY: Dg Chest 2 View  Addendum Date: 12/20/2018   ADDENDUM REPORT: 12/20/2018 19:30 ADDENDUM: Mixed lytic and sclerotic osseous metastases are seen throughout ribs, scapula, and humeri. The posttraumatic deformity at the proximal LEFT humerus likely represents a pathologic fracture through a lytic lesion of the proximal LEFT humeral diaphysis. Electronically Signed   By: Lavonia Dana M.D.   On: 12/20/2018 19:30   Result Date: 12/20/2018 CLINICAL DATA:  Pleural effusion, shortness of breath EXAM: CHEST - 2 VIEW COMPARISON:  11/20/2018 FINDINGS: Upper normal heart size. Stable mediastinal contours. Chronic elevation of RIGHT diaphragm. Small BILATERAL pleural effusions and bibasilar atelectasis. Perihilar infiltrates. Improved RIGHT upper lobe infiltrate since previous study. No pneumothorax. Bones severely demineralized with old posttraumatic deformity of the proximal LEFT humerus. IMPRESSION: Bibasilar pleural effusions and atelectasis. Mild BILATERAL pulmonary infiltrates, improved in RIGHT upper lobe since prior study. Electronically Signed: By: Lavonia Dana M.D. On: 12/20/2018 19:23   Dg Shoulder 1v Left  Result Date: 12/20/2018 CLINICAL DATA:  Metastatic breast cancer, shoulder pain EXAM: LEFT SHOULDER - 1 VIEW COMPARISON:  None FINDINGS: Mixed lytic and sclerotic lucency throughout the LEFT ribs, LEFT scapula and LEFT humerus compatible with widespread osseous metastatic disease. Pathologic  fracture through a  large lytic lesion at the proximal LEFT humeral diaphysis. IMPRESSION: Widespread osseous metastatic disease involving the LEFT ribs, LEFT scapula and LEFT humerus. Pathologic fracture through a large lytic metastasis at the proximal LEFT humeral diaphysis. Electronically Signed   By: Lavonia Dana M.D.   On: 12/20/2018 19:29   Dg Shoulder 1v Right  Result Date: 12/20/2018 CLINICAL DATA:  Bone metastasis, metastatic breast cancer, shoulder pain EXAM: RIGHT SHOULDER - 1 VIEW COMPARISON:  None FINDINGS: Osseous demineralization. Mottled appearance of ribs, scapula and humerus compatible with diffuse osseous metastatic disease. No fracture dislocation. IMPRESSION: Widespread osseous metastatic disease involving the RIGHT ribs, humerus and scapula. Electronically Signed   By: Lavonia Dana M.D.   On: 12/20/2018 19:27   Ct Angio Chest Pe W And/or Wo Contrast  Result Date: 12/25/2018 CLINICAL DATA:  Shortness of breath. History of metastatic breast cancer. EXAM: CT ANGIOGRAPHY CHEST WITH CONTRAST TECHNIQUE: Multidetector CT imaging of the chest was performed using the standard protocol during bolus administration of intravenous contrast. Multiplanar CT image reconstructions and MIPs were obtained to evaluate the vascular anatomy. CONTRAST:  68 mL OMNIPAQUE IOHEXOL 350 MG/ML SOLN COMPARISON:  CT chest 11/17/2018. Single-view of the chest earlier today. Plain films left shoulder 12/20/2018. FINDINGS: Cardiovascular: No pulmonary embolus is identified. Bovine aortic arch is seen. Calcific aortic and coronary artery disease is noted. Heart size is upper. No pericardial effusion. Mediastinum/Nodes: No enlarged mediastinal, hilar, or axillary lymph nodes. Thyroid gland, trachea, and esophagus demonstrate no significant findings. Small hiatal hernia noted. Lungs/Pleura: Small bilateral pleural effusions are seen. Effusion on the left has decreased in size since the prior chest CT. Right effusion is not notably changed.  Opacities in both lungs in a band configuration are again seen. Scattered mild ground-glass attenuation is noted. There is basilar atelectasis, worse on the right. Upper Abdomen: Gallstones are identified as on the prior study. Visualized upper abdomen is otherwise unremarkable. Musculoskeletal: Diffuse osseous metastatic disease identified. Pathologic fracture of the left humerus is seen as on the prior plain films of the shoulder. Multiple pathologic rib fractures are also again seen. Pathologic fracture right clavicle noted. Review of the MIP images confirms the above findings. IMPRESSION: Negative for pulmonary embolus or acute disease. Small bilateral pleural effusions. Effusion on the left has decreased since the prior CT. Bandlike opacities in both lungs may be due to scar or fibrosis, unchanged. Diffuse osseous metastatic disease and multiple pathologic fractures as seen on prior studies. Gallstones. Aortic Atherosclerosis (ICD10-I70.0). Electronically Signed   By: Inge Rise M.D.   On: 12/25/2018 13:11   Dg Chest Port 1 View  Result Date: 12/25/2018 CLINICAL DATA:  58 year old female with shortness of breath. Stage IV breast cancer. EXAM: PORTABLE CHEST 1 VIEW COMPARISON:  Chest CT 11/17/2018. Chest radiograph 12/20/2018 and earlier. FINDINGS: Portable AP semi upright view at 0915 hours. Continued low lung volumes. Continued confluent bibasilar opacity, greater on the right, compatible with a combination of pleural effusion and nonspecific airspace opacity as demonstrated last month by CT. Ventilation has not significantly changed since 12/20/2018. No pneumothorax or pulmonary edema. Stable cardiac size and mediastinal contours. Diffuse osseous metastatic disease redemonstrated. Pathologic fracture of the medial right clavicle was better demonstrated by CT. IMPRESSION: 1. Ventilation appears not significantly changed from the June CT where bilateral pleural effusions and nonspecific lung base  airspace opacity was noted (please see that report). 2. Diffuse osseous metastatic disease, including pathologic fracture of the right clavicle. Electronically Signed  By: Genevie Ann M.D.   On: 12/25/2018 10:13   Vas [place] Lower Extremity Venous (dvt)  Result Date: 12/26/2018  Lower Venous Study Indications: Edema.  Comparison Study: previous study done 11/17/18 Performing Technologist: Abram Sander RVS  Examination Guidelines: A complete evaluation includes B-mode imaging, spectral Doppler, color Doppler, and power Doppler as needed of all accessible portions of each vessel. Bilateral testing is considered an integral part of a complete examination. Limited examinations for reoccurring indications may be performed as noted.  +---------+---------------+---------+-----------+----------+--------------+  RIGHT     Compressibility Phasicity Spontaneity Properties Summary         +---------+---------------+---------+-----------+----------+--------------+  CFV       Full            Yes       Yes                                    +---------+---------------+---------+-----------+----------+--------------+  SFJ       Full                                                             +---------+---------------+---------+-----------+----------+--------------+  FV Prox   Full                                                             +---------+---------------+---------+-----------+----------+--------------+  FV Mid    Full                                                             +---------+---------------+---------+-----------+----------+--------------+  FV Distal Full                                                             +---------+---------------+---------+-----------+----------+--------------+  PFV       Full                                                             +---------+---------------+---------+-----------+----------+--------------+  POP       Full            Yes       Yes                                     +---------+---------------+---------+-----------+----------+--------------+  PTV       Full                                                             +---------+---------------+---------+-----------+----------+--------------+  PERO                                                       Not visualized  +---------+---------------+---------+-----------+----------+--------------+   +---------+---------------+---------+-----------+----------+--------------+  LEFT      Compressibility Phasicity Spontaneity Properties Summary         +---------+---------------+---------+-----------+----------+--------------+  CFV       Full            Yes       Yes                                    +---------+---------------+---------+-----------+----------+--------------+  SFJ       Full                                                             +---------+---------------+---------+-----------+----------+--------------+  FV Prox   Full                                                             +---------+---------------+---------+-----------+----------+--------------+  FV Mid    Full                                                             +---------+---------------+---------+-----------+----------+--------------+  FV Distal Full                                                             +---------+---------------+---------+-----------+----------+--------------+  PFV       Full                                                             +---------+---------------+---------+-----------+----------+--------------+  POP       Full            Yes       Yes                                    +---------+---------------+---------+-----------+----------+--------------+  PTV       Full                                                             +---------+---------------+---------+-----------+----------+--------------+  PERO                                                       Not visualized   +---------+---------------+---------+-----------+----------+--------------+     Summary: Right: There is no evidence of deep vein thrombosis in the lower extremity. Left: No evidence of deep vein thrombosis in the lower extremity.  *See table(s) above for measurements and observations. Electronically signed by Curt Jews MD on 12/26/2018 at 1:51:48 PM.    Final       IMPRESSION/PLAN: metastatic breast cancer, left humeral pathologic fracture.  Will refer to orthopedics for possible surgical stabilization of bone, if feasible.    She will need adjuvant RT after; if no surgery is done, RT will be given upfront for palliation   It was a pleasure meeting the patient today. We discussed the risks, benefits, and side effects of radiotherapy. I recommend radiotherapy to the left humerus for palliation.  We discussed that radiation would take approximately 1-2 weeks to complete   The patient is enthusiastic about proceeding with treatment. I look forward to participating in the patient's care.  I will await her referral back to me for postoperative follow-up and eventual CT simulation/treatment planning.   I notified Thedore Mins that patient is having trouble getting her sling, in hopes that this can be navigated.   This encounter was provided by telemedicine platform Webex.  The patient has given verbal consent for this type of encounter and has been advised to only accept a meeting of this type in a secure network environment. The time spent during this encounter was 20 minutes. The attendants for this meeting include Eppie Gibson  and [name].  During the encounter, Eppie Gibson was located at [facility] Radiation Oncology Department.  [name] was located at home.    __________________________________________   Eppie Gibson, MD

## 2018-12-25 ENCOUNTER — Inpatient Hospital Stay (HOSPITAL_COMMUNITY)
Admission: EM | Admit: 2018-12-25 | Discharge: 2019-01-24 | DRG: 853 | Disposition: A | Discharge status: Home / Self Care | Payer: Medicaid Other | Attending: Internal Medicine | Admitting: Internal Medicine

## 2018-12-25 ENCOUNTER — Emergency Department (HOSPITAL_COMMUNITY): Payer: Medicaid Other

## 2018-12-25 ENCOUNTER — Telehealth: Payer: Self-pay | Admitting: *Deleted

## 2018-12-25 ENCOUNTER — Encounter (HOSPITAL_COMMUNITY): Payer: Self-pay

## 2018-12-25 ENCOUNTER — Other Ambulatory Visit: Payer: Self-pay

## 2018-12-25 DIAGNOSIS — X58XXXD Exposure to other specified factors, subsequent encounter: Secondary | ICD-10-CM | POA: Diagnosis not present

## 2018-12-25 DIAGNOSIS — J189 Pneumonia, unspecified organism: Secondary | ICD-10-CM | POA: Diagnosis not present

## 2018-12-25 DIAGNOSIS — M84522A Pathological fracture in neoplastic disease, left humerus, initial encounter for fracture: Secondary | ICD-10-CM | POA: Diagnosis not present

## 2018-12-25 DIAGNOSIS — T17908A Unspecified foreign body in respiratory tract, part unspecified causing other injury, initial encounter: Secondary | ICD-10-CM

## 2018-12-25 DIAGNOSIS — Z01818 Encounter for other preprocedural examination: Secondary | ICD-10-CM | POA: Diagnosis not present

## 2018-12-25 DIAGNOSIS — L89152 Pressure ulcer of sacral region, stage 2: Secondary | ICD-10-CM | POA: Diagnosis present

## 2018-12-25 DIAGNOSIS — J8410 Pulmonary fibrosis, unspecified: Secondary | ICD-10-CM | POA: Diagnosis present

## 2018-12-25 DIAGNOSIS — C7802 Secondary malignant neoplasm of left lung: Secondary | ICD-10-CM | POA: Diagnosis not present

## 2018-12-25 DIAGNOSIS — J9601 Acute respiratory failure with hypoxia: Secondary | ICD-10-CM | POA: Diagnosis not present

## 2018-12-25 DIAGNOSIS — M84422A Pathological fracture, left humerus, initial encounter for fracture: Secondary | ICD-10-CM | POA: Diagnosis not present

## 2018-12-25 DIAGNOSIS — J029 Acute pharyngitis, unspecified: Secondary | ICD-10-CM

## 2018-12-25 DIAGNOSIS — R059 Cough, unspecified: Secondary | ICD-10-CM

## 2018-12-25 DIAGNOSIS — I110 Hypertensive heart disease with heart failure: Secondary | ICD-10-CM | POA: Diagnosis present

## 2018-12-25 DIAGNOSIS — L89322 Pressure ulcer of left buttock, stage 2: Secondary | ICD-10-CM | POA: Diagnosis present

## 2018-12-25 DIAGNOSIS — Z7722 Contact with and (suspected) exposure to environmental tobacco smoke (acute) (chronic): Secondary | ICD-10-CM | POA: Diagnosis present

## 2018-12-25 DIAGNOSIS — M84411D Pathological fracture, right shoulder, subsequent encounter for fracture with routine healing: Secondary | ICD-10-CM | POA: Diagnosis not present

## 2018-12-25 DIAGNOSIS — Z4682 Encounter for fitting and adjustment of non-vascular catheter: Secondary | ICD-10-CM | POA: Diagnosis not present

## 2018-12-25 DIAGNOSIS — F419 Anxiety disorder, unspecified: Secondary | ICD-10-CM | POA: Diagnosis present

## 2018-12-25 DIAGNOSIS — M84511A Pathological fracture in neoplastic disease, right shoulder, initial encounter for fracture: Secondary | ICD-10-CM | POA: Diagnosis present

## 2018-12-25 DIAGNOSIS — D630 Anemia in neoplastic disease: Secondary | ICD-10-CM | POA: Diagnosis present

## 2018-12-25 DIAGNOSIS — J9621 Acute and chronic respiratory failure with hypoxia: Secondary | ICD-10-CM

## 2018-12-25 DIAGNOSIS — R0602 Shortness of breath: Secondary | ICD-10-CM | POA: Diagnosis not present

## 2018-12-25 DIAGNOSIS — C779 Secondary and unspecified malignant neoplasm of lymph node, unspecified: Secondary | ICD-10-CM | POA: Diagnosis present

## 2018-12-25 DIAGNOSIS — R5381 Other malaise: Secondary | ICD-10-CM | POA: Diagnosis not present

## 2018-12-25 DIAGNOSIS — C50911 Malignant neoplasm of unspecified site of right female breast: Secondary | ICD-10-CM | POA: Diagnosis not present

## 2018-12-25 DIAGNOSIS — H409 Unspecified glaucoma: Secondary | ICD-10-CM | POA: Diagnosis present

## 2018-12-25 DIAGNOSIS — Z20828 Contact with and (suspected) exposure to other viral communicable diseases: Secondary | ICD-10-CM | POA: Diagnosis present

## 2018-12-25 DIAGNOSIS — S42309A Unspecified fracture of shaft of humerus, unspecified arm, initial encounter for closed fracture: Secondary | ICD-10-CM

## 2018-12-25 DIAGNOSIS — Z9981 Dependence on supplemental oxygen: Secondary | ICD-10-CM

## 2018-12-25 DIAGNOSIS — M84511D Pathological fracture in neoplastic disease, right shoulder, subsequent encounter for fracture with routine healing: Secondary | ICD-10-CM | POA: Diagnosis not present

## 2018-12-25 DIAGNOSIS — R05 Cough: Secondary | ICD-10-CM

## 2018-12-25 DIAGNOSIS — B9561 Methicillin susceptible Staphylococcus aureus infection as the cause of diseases classified elsewhere: Secondary | ICD-10-CM | POA: Diagnosis not present

## 2018-12-25 DIAGNOSIS — J9611 Chronic respiratory failure with hypoxia: Secondary | ICD-10-CM | POA: Diagnosis present

## 2018-12-25 DIAGNOSIS — R7881 Bacteremia: Secondary | ICD-10-CM | POA: Diagnosis present

## 2018-12-25 DIAGNOSIS — R0902 Hypoxemia: Secondary | ICD-10-CM | POA: Diagnosis not present

## 2018-12-25 DIAGNOSIS — M549 Dorsalgia, unspecified: Secondary | ICD-10-CM | POA: Diagnosis not present

## 2018-12-25 DIAGNOSIS — A4101 Sepsis due to Methicillin susceptible Staphylococcus aureus: Principal | ICD-10-CM | POA: Diagnosis present

## 2018-12-25 DIAGNOSIS — C7800 Secondary malignant neoplasm of unspecified lung: Secondary | ICD-10-CM | POA: Diagnosis not present

## 2018-12-25 DIAGNOSIS — M84422D Pathological fracture, left humerus, subsequent encounter for fracture with routine healing: Secondary | ICD-10-CM | POA: Diagnosis not present

## 2018-12-25 DIAGNOSIS — Z800 Family history of malignant neoplasm of digestive organs: Secondary | ICD-10-CM

## 2018-12-25 DIAGNOSIS — J90 Pleural effusion, not elsewhere classified: Secondary | ICD-10-CM | POA: Diagnosis not present

## 2018-12-25 DIAGNOSIS — R0689 Other abnormalities of breathing: Secondary | ICD-10-CM

## 2018-12-25 DIAGNOSIS — S42302D Unspecified fracture of shaft of humerus, left arm, subsequent encounter for fracture with routine healing: Secondary | ICD-10-CM | POA: Diagnosis not present

## 2018-12-25 DIAGNOSIS — J069 Acute upper respiratory infection, unspecified: Secondary | ICD-10-CM

## 2018-12-25 DIAGNOSIS — M2550 Pain in unspecified joint: Secondary | ICD-10-CM | POA: Diagnosis not present

## 2018-12-25 DIAGNOSIS — E876 Hypokalemia: Secondary | ICD-10-CM | POA: Diagnosis not present

## 2018-12-25 DIAGNOSIS — Y95 Nosocomial condition: Secondary | ICD-10-CM | POA: Diagnosis present

## 2018-12-25 DIAGNOSIS — R8271 Bacteriuria: Secondary | ICD-10-CM | POA: Diagnosis not present

## 2018-12-25 DIAGNOSIS — L89312 Pressure ulcer of right buttock, stage 2: Secondary | ICD-10-CM | POA: Diagnosis present

## 2018-12-25 DIAGNOSIS — I5032 Chronic diastolic (congestive) heart failure: Secondary | ICD-10-CM | POA: Diagnosis present

## 2018-12-25 DIAGNOSIS — C50919 Malignant neoplasm of unspecified site of unspecified female breast: Secondary | ICD-10-CM | POA: Diagnosis not present

## 2018-12-25 DIAGNOSIS — Z79899 Other long term (current) drug therapy: Secondary | ICD-10-CM

## 2018-12-25 DIAGNOSIS — B372 Candidiasis of skin and nail: Secondary | ICD-10-CM | POA: Diagnosis not present

## 2018-12-25 DIAGNOSIS — R609 Edema, unspecified: Secondary | ICD-10-CM | POA: Diagnosis not present

## 2018-12-25 DIAGNOSIS — J989 Respiratory disorder, unspecified: Secondary | ICD-10-CM | POA: Diagnosis not present

## 2018-12-25 DIAGNOSIS — Z7952 Long term (current) use of systemic steroids: Secondary | ICD-10-CM

## 2018-12-25 DIAGNOSIS — Z170 Estrogen receptor positive status [ER+]: Secondary | ICD-10-CM

## 2018-12-25 DIAGNOSIS — D649 Anemia, unspecified: Secondary | ICD-10-CM | POA: Diagnosis not present

## 2018-12-25 DIAGNOSIS — Z209 Contact with and (suspected) exposure to unspecified communicable disease: Secondary | ICD-10-CM | POA: Diagnosis not present

## 2018-12-25 DIAGNOSIS — Z79811 Long term (current) use of aromatase inhibitors: Secondary | ICD-10-CM

## 2018-12-25 DIAGNOSIS — Z803 Family history of malignant neoplasm of breast: Secondary | ICD-10-CM

## 2018-12-25 DIAGNOSIS — Z6841 Body Mass Index (BMI) 40.0 and over, adult: Secondary | ICD-10-CM

## 2018-12-25 DIAGNOSIS — S42292D Other displaced fracture of upper end of left humerus, subsequent encounter for fracture with routine healing: Secondary | ICD-10-CM | POA: Diagnosis not present

## 2018-12-25 DIAGNOSIS — C7801 Secondary malignant neoplasm of right lung: Secondary | ICD-10-CM | POA: Diagnosis not present

## 2018-12-25 DIAGNOSIS — R413 Other amnesia: Secondary | ICD-10-CM | POA: Diagnosis not present

## 2018-12-25 DIAGNOSIS — H9190 Unspecified hearing loss, unspecified ear: Secondary | ICD-10-CM | POA: Diagnosis present

## 2018-12-25 DIAGNOSIS — J9811 Atelectasis: Secondary | ICD-10-CM | POA: Diagnosis not present

## 2018-12-25 DIAGNOSIS — J9620 Acute and chronic respiratory failure, unspecified whether with hypoxia or hypercapnia: Secondary | ICD-10-CM | POA: Diagnosis not present

## 2018-12-25 DIAGNOSIS — J910 Malignant pleural effusion: Secondary | ICD-10-CM | POA: Diagnosis not present

## 2018-12-25 DIAGNOSIS — Z8249 Family history of ischemic heart disease and other diseases of the circulatory system: Secondary | ICD-10-CM

## 2018-12-25 DIAGNOSIS — F329 Major depressive disorder, single episode, unspecified: Secondary | ICD-10-CM | POA: Diagnosis not present

## 2018-12-25 DIAGNOSIS — G8918 Other acute postprocedural pain: Secondary | ICD-10-CM | POA: Diagnosis not present

## 2018-12-25 DIAGNOSIS — Z7401 Bed confinement status: Secondary | ICD-10-CM | POA: Diagnosis not present

## 2018-12-25 DIAGNOSIS — J9612 Chronic respiratory failure with hypercapnia: Secondary | ICD-10-CM | POA: Diagnosis present

## 2018-12-25 DIAGNOSIS — D849 Immunodeficiency, unspecified: Secondary | ICD-10-CM | POA: Diagnosis not present

## 2018-12-25 DIAGNOSIS — J9600 Acute respiratory failure, unspecified whether with hypoxia or hypercapnia: Secondary | ICD-10-CM | POA: Diagnosis present

## 2018-12-25 DIAGNOSIS — R627 Adult failure to thrive: Secondary | ICD-10-CM | POA: Diagnosis not present

## 2018-12-25 DIAGNOSIS — I5033 Acute on chronic diastolic (congestive) heart failure: Secondary | ICD-10-CM | POA: Diagnosis not present

## 2018-12-25 DIAGNOSIS — J988 Other specified respiratory disorders: Secondary | ICD-10-CM

## 2018-12-25 DIAGNOSIS — L8990 Pressure ulcer of unspecified site, unspecified stage: Secondary | ICD-10-CM | POA: Diagnosis present

## 2018-12-25 DIAGNOSIS — Z808 Family history of malignant neoplasm of other organs or systems: Secondary | ICD-10-CM

## 2018-12-25 DIAGNOSIS — L89156 Pressure-induced deep tissue damage of sacral region: Secondary | ICD-10-CM | POA: Diagnosis present

## 2018-12-25 DIAGNOSIS — Z4659 Encounter for fitting and adjustment of other gastrointestinal appliance and device: Secondary | ICD-10-CM

## 2018-12-25 DIAGNOSIS — L89159 Pressure ulcer of sacral region, unspecified stage: Secondary | ICD-10-CM | POA: Diagnosis not present

## 2018-12-25 DIAGNOSIS — T80219A Unspecified infection due to central venous catheter, initial encounter: Secondary | ICD-10-CM | POA: Diagnosis not present

## 2018-12-25 DIAGNOSIS — C7951 Secondary malignant neoplasm of bone: Secondary | ICD-10-CM | POA: Diagnosis present

## 2018-12-25 LAB — COMPREHENSIVE METABOLIC PANEL
ALT: 35 U/L (ref 0–44)
AST: 24 U/L (ref 15–41)
Albumin: 2.3 g/dL — ABNORMAL LOW (ref 3.5–5.0)
Alkaline Phosphatase: 235 U/L — ABNORMAL HIGH (ref 38–126)
Anion gap: 11 (ref 5–15)
BUN: 12 mg/dL (ref 6–20)
CO2: 33 mmol/L — ABNORMAL HIGH (ref 22–32)
Calcium: 7 mg/dL — ABNORMAL LOW (ref 8.9–10.3)
Chloride: 92 mmol/L — ABNORMAL LOW (ref 98–111)
Creatinine, Ser: 0.4 mg/dL — ABNORMAL LOW (ref 0.44–1.00)
GFR calc Af Amer: >60 mL/min (ref >60)
GFR calc non Af Amer: >60 mL/min (ref >60)
Glucose, Bld: 159 mg/dL — ABNORMAL HIGH (ref 70–99)
Potassium: 3.6 mmol/L (ref 3.5–5.1)
Sodium: 136 mmol/L (ref 135–145)
Total Bilirubin: 0.6 mg/dL (ref 0.3–1.2)
Total Protein: 7 g/dL (ref 6.5–8.1)

## 2018-12-25 LAB — CBC WITH DIFFERENTIAL/PLATELET
Abs Immature Granulocytes: 0.19 10*3/uL — ABNORMAL HIGH (ref 0.00–0.07)
Basophils Absolute: 0 10*3/uL (ref 0.0–0.1)
Basophils Relative: 0 %
Eosinophils Absolute: 0 10*3/uL (ref 0.0–0.5)
Eosinophils Relative: 1 %
HCT: 31.6 % — ABNORMAL LOW (ref 36.0–46.0)
Hemoglobin: 9.6 g/dL — ABNORMAL LOW (ref 12.0–15.0)
Immature Granulocytes: 2 %
Lymphocytes Relative: 28 %
Lymphs Abs: 2.2 10*3/uL (ref 0.7–4.0)
MCH: 31.8 pg (ref 26.0–34.0)
MCHC: 30.4 g/dL (ref 30.0–36.0)
MCV: 104.6 fL — ABNORMAL HIGH (ref 80.0–100.0)
Monocytes Absolute: 0.5 10*3/uL (ref 0.1–1.0)
Monocytes Relative: 7 %
Neutro Abs: 5 10*3/uL (ref 1.7–7.7)
Neutrophils Relative %: 62 %
Platelets: 214 10*3/uL (ref 150–400)
RBC: 3.02 MIL/uL — ABNORMAL LOW (ref 3.87–5.11)
RDW: 19.7 % — ABNORMAL HIGH (ref 11.5–15.5)
WBC: 8.1 10*3/uL (ref 4.0–10.5)
nRBC: 5.3 % — ABNORMAL HIGH (ref 0.0–0.2)

## 2018-12-25 LAB — TRIGLYCERIDES: Triglycerides: 108 mg/dL (ref <150)

## 2018-12-25 LAB — PROCALCITONIN: Procalcitonin: <0.1 ng/mL

## 2018-12-25 LAB — LACTATE DEHYDROGENASE: LDH: 244 U/L — ABNORMAL HIGH (ref 98–192)

## 2018-12-25 LAB — LACTIC ACID, PLASMA: Lactic Acid, Venous: 1 mmol/L (ref 0.5–1.9)

## 2018-12-25 LAB — I-STAT BETA HCG BLOOD, ED (MC, WL, AP ONLY): I-stat hCG, quantitative: <5 m[IU]/mL (ref <5)

## 2018-12-25 LAB — SARS CORONAVIRUS 2 BY RT PCR (HOSPITAL ORDER, PERFORMED IN ~~LOC~~ HOSPITAL LAB)
SARS Coronavirus 2: NEGATIVE
SARS Coronavirus 2: NEGATIVE

## 2018-12-25 LAB — FERRITIN: Ferritin: 2306 ng/mL — ABNORMAL HIGH (ref 11–307)

## 2018-12-25 LAB — D-DIMER, QUANTITATIVE: D-Dimer, Quant: 7.92 ug/mL-FEU — ABNORMAL HIGH (ref 0.00–0.50)

## 2018-12-25 LAB — BRAIN NATRIURETIC PEPTIDE: B Natriuretic Peptide: 89 pg/mL (ref 0.0–100.0)

## 2018-12-25 LAB — MRSA PCR SCREENING: MRSA by PCR: NEGATIVE

## 2018-12-25 LAB — C-REACTIVE PROTEIN: CRP: 19.6 mg/dL — ABNORMAL HIGH (ref <1.0)

## 2018-12-25 LAB — FIBRINOGEN: Fibrinogen: >800 mg/dL — ABNORMAL HIGH (ref 210–475)

## 2018-12-25 MED ORDER — SODIUM CHLORIDE 0.9 % IV SOLN
1.0000 g | Freq: Once | INTRAVENOUS | Status: AC
  Administered 2018-12-25: 1 g via INTRAVENOUS
  Filled 2018-12-25: qty 1

## 2018-12-25 MED ORDER — LORATADINE 10 MG PO TABS
10.0000 mg | ORAL_TABLET | Freq: Every day | ORAL | Status: DC
  Administered 2018-12-25 – 2019-01-11 (×17): 10 mg via ORAL
  Filled 2018-12-25 (×17): qty 1

## 2018-12-25 MED ORDER — ORAL CARE MOUTH RINSE
15.0000 mL | Freq: Two times a day (BID) | OROMUCOSAL | Status: DC
  Administered 2018-12-25 – 2019-01-08 (×8): 15 mL via OROMUCOSAL

## 2018-12-25 MED ORDER — IPRATROPIUM-ALBUTEROL 0.5-2.5 (3) MG/3ML IN SOLN: 3.0000 mL | Freq: Four times a day (QID) | RESPIRATORY_TRACT | Status: DC

## 2018-12-25 MED ORDER — NYSTATIN 100000 UNIT/GM EX POWD
Freq: Three times a day (TID) | CUTANEOUS | Status: DC
  Administered 2018-12-25 – 2018-12-28 (×11): via TOPICAL
  Administered 2018-12-29: 1 via TOPICAL
  Administered 2018-12-29 – 2019-01-05 (×21): via TOPICAL
  Administered 2019-01-05: 1 via TOPICAL
  Administered 2019-01-06 – 2019-01-24 (×51): via TOPICAL
  Filled 2018-12-25 (×3): qty 15

## 2018-12-25 MED ORDER — ALBUTEROL SULFATE HFA 108 (90 BASE) MCG/ACT IN AERS
6.0000 | INHALATION_SPRAY | RESPIRATORY_TRACT | Status: DC | PRN
  Administered 2018-12-25: 6 via RESPIRATORY_TRACT
  Filled 2018-12-25: qty 6.7

## 2018-12-25 MED ORDER — SODIUM CHLORIDE 0.9 % IV SOLN
250.0000 mL | INTRAVENOUS | Status: DC | PRN
  Administered 2019-01-02: 250 mL via INTRAVENOUS
  Administered 2019-01-10: 17:00:00 via INTRAVENOUS
  Administered 2019-01-12: 250 mL via INTRAVENOUS

## 2018-12-25 MED ORDER — VANCOMYCIN HCL IN DEXTROSE 1-5 GM/200ML-% IV SOLN
1000.0000 mg | Freq: Two times a day (BID) | INTRAVENOUS | Status: DC
  Administered 2018-12-26: 01:00:00 1000 mg via INTRAVENOUS
  Filled 2018-12-25: qty 200

## 2018-12-25 MED ORDER — IOHEXOL 350 MG/ML SOLN
100.0000 mL | Freq: Once | INTRAVENOUS | Status: AC | PRN
  Administered 2018-12-25: 68 mL via INTRAVENOUS

## 2018-12-25 MED ORDER — ACETAMINOPHEN 650 MG RE SUPP: 650.0000 mg | Freq: Four times a day (QID) | RECTAL | Status: DC | PRN

## 2018-12-25 MED ORDER — FUROSEMIDE 10 MG/ML IJ SOLN
40.0000 mg | Freq: Once | INTRAMUSCULAR | Status: AC
  Administered 2018-12-25: 17:00:00 40 mg via INTRAVENOUS

## 2018-12-25 MED ORDER — ONDANSETRON HCL 4 MG PO TABS: 4.0000 mg | ORAL_TABLET | Freq: Four times a day (QID) | ORAL | Status: DC | PRN

## 2018-12-25 MED ORDER — CALCIUM CARBONATE-VITAMIN D 500-200 MG-UNIT PO TABS
1.0000 | ORAL_TABLET | Freq: Three times a day (TID) | ORAL | Status: DC
  Administered 2018-12-26 – 2019-01-11 (×44): 1 via ORAL
  Filled 2018-12-25 (×45): qty 1

## 2018-12-25 MED ORDER — SODIUM CHLORIDE 0.9% FLUSH
3.0000 mL | Freq: Two times a day (BID) | INTRAVENOUS | Status: DC
  Administered 2018-12-25 – 2019-01-12 (×19): 3 mL via INTRAVENOUS
  Administered 2019-01-12: 10 mL via INTRAVENOUS
  Administered 2019-01-13 – 2019-01-20 (×12): 3 mL via INTRAVENOUS

## 2018-12-25 MED ORDER — VANCOMYCIN HCL 10 G IV SOLR
2000.0000 mg | Freq: Once | INTRAVENOUS | Status: AC
  Administered 2018-12-25: 12:00:00 2000 mg via INTRAVENOUS
  Filled 2018-12-25: qty 2000

## 2018-12-25 MED ORDER — ACETAMINOPHEN 325 MG PO TABS
650.0000 mg | ORAL_TABLET | Freq: Four times a day (QID) | ORAL | Status: DC | PRN
  Administered 2018-12-26: 650 mg via ORAL
  Filled 2018-12-25: qty 2

## 2018-12-25 MED ORDER — POLYETHYLENE GLYCOL 3350 17 G PO PACK: 17.0000 g | PACK | Freq: Every day | ORAL | Status: DC | PRN

## 2018-12-25 MED ORDER — CARVEDILOL 6.25 MG PO TABS
6.2500 mg | ORAL_TABLET | Freq: Two times a day (BID) | ORAL | Status: DC
  Administered 2018-12-25 – 2019-01-11 (×33): 6.25 mg via ORAL
  Filled 2018-12-25 (×32): qty 1

## 2018-12-25 MED ORDER — CHLORHEXIDINE GLUCONATE CLOTH 2 % EX PADS
6.0000 | MEDICATED_PAD | Freq: Every day | CUTANEOUS | Status: DC
  Administered 2018-12-27 – 2019-01-09 (×8): 6 via TOPICAL

## 2018-12-25 MED ORDER — SODIUM CHLORIDE 0.9% FLUSH: 3.0000 mL | INTRAVENOUS | Status: DC | PRN

## 2018-12-25 MED ORDER — METHYLPREDNISOLONE SODIUM SUCC 125 MG IJ SOLR
60.0000 mg | Freq: Two times a day (BID) | INTRAMUSCULAR | Status: DC
  Administered 2018-12-25 – 2018-12-27 (×4): 60 mg via INTRAVENOUS
  Administered 2018-12-27: 62.5 mg via INTRAVENOUS
  Administered 2018-12-28 (×2): 60 mg via INTRAVENOUS
  Filled 2018-12-25 (×7): qty 2

## 2018-12-25 MED ORDER — ONDANSETRON HCL 4 MG/2ML IJ SOLN
4.0000 mg | Freq: Four times a day (QID) | INTRAMUSCULAR | Status: DC | PRN
  Administered 2018-12-30: 4 mg via INTRAVENOUS
  Filled 2018-12-25: qty 2

## 2018-12-25 MED ORDER — SODIUM CHLORIDE 0.9 % IV SOLN
2.0000 g | Freq: Three times a day (TID) | INTRAVENOUS | Status: DC
  Administered 2018-12-25: 23:00:00 2 g via INTRAVENOUS
  Filled 2018-12-25 (×2): qty 2

## 2018-12-25 MED ORDER — ENOXAPARIN SODIUM 60 MG/0.6ML ~~LOC~~ SOLN
60.0000 mg | SUBCUTANEOUS | Status: DC
  Administered 2018-12-25: 60 mg via SUBCUTANEOUS
  Filled 2018-12-25: qty 0.6

## 2018-12-25 MED ORDER — CHLORHEXIDINE GLUCONATE 0.12 % MT SOLN
15.0000 mL | Freq: Two times a day (BID) | OROMUCOSAL | Status: DC
  Administered 2018-12-25 – 2019-01-09 (×31): 15 mL via OROMUCOSAL
  Filled 2018-12-25 (×25): qty 15

## 2018-12-25 MED ORDER — ALPRAZOLAM 0.25 MG PO TABS
0.2500 mg | ORAL_TABLET | Freq: Three times a day (TID) | ORAL | Status: DC | PRN
  Administered 2019-01-02: 0.25 mg via ORAL
  Administered 2019-01-07 – 2019-01-08 (×2): 0.5 mg via ORAL
  Filled 2018-12-25 (×2): qty 2
  Filled 2018-12-25 (×2): qty 1

## 2018-12-25 MED ORDER — ACETAMINOPHEN 325 MG PO TABS
650.0000 mg | ORAL_TABLET | Freq: Once | ORAL | Status: AC
  Administered 2018-12-25: 650 mg via ORAL
  Filled 2018-12-25: qty 2

## 2018-12-25 MED ORDER — DOCUSATE SODIUM 100 MG PO CAPS
100.0000 mg | ORAL_CAPSULE | Freq: Two times a day (BID) | ORAL | Status: DC
  Administered 2018-12-26 – 2019-01-16 (×28): 100 mg via ORAL
  Filled 2018-12-25 (×38): qty 1

## 2018-12-25 MED ORDER — BIOTENE DRY MOUTH MT LIQD: 15.0000 mL | OROMUCOSAL | Status: DC | PRN

## 2018-12-25 MED ORDER — SODIUM CHLORIDE (PF) 0.9 % IJ SOLN
INTRAMUSCULAR | Status: AC
  Administered 2018-12-25: 13:00:00
  Filled 2018-12-25: qty 50

## 2018-12-25 MED ORDER — IPRATROPIUM-ALBUTEROL 0.5-2.5 (3) MG/3ML IN SOLN
3.0000 mL | Freq: Four times a day (QID) | RESPIRATORY_TRACT | Status: DC
  Administered 2018-12-25 – 2018-12-26 (×6): 3 mL via RESPIRATORY_TRACT
  Filled 2018-12-25 (×5): qty 3

## 2018-12-25 MED ORDER — ANASTROZOLE 1 MG PO TABS
1.0000 mg | ORAL_TABLET | Freq: Every day | ORAL | Status: DC
  Administered 2018-12-25 – 2019-01-24 (×31): 1 mg via ORAL
  Filled 2018-12-25 (×31): qty 1

## 2018-12-25 MED ORDER — OXYCODONE HCL 5 MG PO TABS
2.5000 mg | ORAL_TABLET | ORAL | Status: DC | PRN
  Administered 2018-12-26 – 2019-01-04 (×4): 2.5 mg via ORAL
  Filled 2018-12-25 (×5): qty 1

## 2018-12-25 NOTE — ED Notes (Signed)
Pure wick has been placed. Suction set to 45mmHg.  

## 2018-12-25 NOTE — ED Notes (Signed)
Pt returned from CT °

## 2018-12-25 NOTE — Telephone Encounter (Signed)
This RN retrieved VM at 940 left by pt's daughter - [name] - at 36 am requesting a return call due to " mom is having breathing problems even though she is wearing the oxygen "  " we had a really rough night and she didn't sleep well due to having hallucinations and bad dreams from the narcotics and then the pain from her arm "  " I checked her saturations and they are ok but need to know if I need to increase the oxygen or have her go to the hospital "  [name] left 2nd message at [place] stating " mom's breathing got worse so I called 911 and they took her to the hospital - so you don't need to call me back "  This RN noted pt has arrived to [facility] - will notify MD.

## 2018-12-25 NOTE — H&P (Addendum)
History and Physical  Suzanne [name] VHQ:[id number] DOB: 27-Aug-1960 DOA: 12/25/2018  PCP: Patient, No Pcp Per Patient coming from: Home  I have personally briefly reviewed patient's old medical records in [place]   Chief Complaint: Shortness of breath  HPI: Suzanne [name] is a 58 y.o. 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  Patient was sitting bedside commode and she was not able to stand up this morning and she developed worsening shortness of breath.  She denies chest pain, cough, sore throat.  Her thought that patient has been tired because of multiple appointments. Patient appears mildly confused.  She is still having some shortness of breath.  She denies chest pain, abdominal pain.  Evaluation in the ED; patient was noted to be febrile temperature 100, tachycardic.  Labs sodium 136, potassium 3.6, CO2 33, BUN 12 calcium 7.0, alkaline phosphatase 235, albumin 2.3, LDH 244, ferritin 2306, CRP 19, procalcitonin less than 0.10, d-dimer 7.9, fibrinogen more than 800, white blood cell 8.1, hemoglobin 9.6, platelets 214.  BNP 89.  X-ray:Ventilation appears not significantly changed from the June CT where bilateral pleural effusions and nonspecific lung base airspace opacity was noted (please see that report). Diffuse osseous metastatic disease, including pathologic fracture of the right clavicle.  CT angio; Negative for pulmonary embolus or acute disease. Small bilateral pleural effusions. Effusion on the left has decreased since the prior CT. Bandlike opacities in both lungs may be due  to scar or fibrosis, unchanged.   Review of Systems: All systems reviewed and apart from history of presenting illness, are negative.  Past Medical History:  Diagnosis Date   Cancer [facility])    Family history of breast cancer    Family history of colon cancer    Family history of melanoma    Family history of prostate cancer    Gallstones    Glaucoma    Mastitis    reports history of recurrent mastitis   Metastatic breast cancer ([place])    Obesity    Past Surgical History:  Procedure Laterality Date   GLAUCOMA SURGERY  late 1990's   IR THORACENTESIS ASP PLEURAL SPACE W/IMG GUIDE  10/28/2018   IR THORACENTESIS ASP PLEURAL SPACE W/IMG GUIDE  10/29/2018   Social History:  reports that she has never smoked. She has never used smokeless tobacco. She reports that she does not drink alcohol or use drugs.   No Known Allergies  Family History  Problem Relation Age of Onset   Breast cancer Sister        in her 69's-70s   Heart disease Brother        CABG   Heart disease Brother        CABG   Skin cancer Brother        melanoma   Colon cancer Cousin    Heart attack Mother    Heart attack Father    Prostate cancer Brother     Prior to Admission medications   Medication Sig Start Date End Date Taking? Authorizing Provider  ALPRAZolam (XANAX) 0.25 MG tablet Take 0.25-0.5 mg by mouth every 8 (eight) hours as needed for anxiety.  12/19/18  Yes [provider]  anastrozole (ARIMIDEX) 1 MG tablet Take 1 tablet (1 mg total) by mouth daily. 12/12/18  Yes [name], Virgie Dad, MD  antiseptic oral rinse (BIOTENE) LIQD 15 mLs by Mouth Rinse route as needed for dry mouth.   Yes [provider]  benzonatate (TESSALON) 200 MG capsule TAKE 1 CAPSULE BY MOUTH 3 TIMES DAILY IF NEEDED Patient taking differently: Take 200 mg by mouth 3 (three) times daily as needed for cough.  12/23/18  Yes [name], Virgie Dad, MD  calcium-vitamin D (OSCAL WITH D) 500-200 MG-UNIT tablet  Take 1 tablet by mouth 3 (three) times daily. 11/30/18  Yes Rai, Ripudeep K, MD  carvedilol (COREG) 6.25 MG tablet Take 1 tablet (6.25 mg total) by mouth 2 (two) times daily with a meal. 12/01/18  Yes Rai, Ripudeep K, MD  furosemide (LASIX) 20 MG tablet Take 2 tablets (40 mg total) by mouth 2 (two) times daily. 12/09/18  Yes Causey, Charlestine Massed, NP  loratadine (CLARITIN) 10 MG tablet Take 1 tablet (10 mg total) by mouth daily. 11/29/18  Yes Rai, Ripudeep K, MD  nystatin (MYCOSTATIN/NYSTOP) powder Apply topically 3 (three) times daily. Apply under breast area 11/28/18  Yes Rai, Ripudeep K, MD  oxyCODONE (OXY IR/ROXICODONE) 5 MG immediate release tablet Take 0.5 tablets (2.5 mg total) by mouth every 4 (four) hours as needed for moderate pain or severe pain. 11/29/18  Yes Rai, Ripudeep K, MD  polyethylene glycol (MIRALAX / GLYCOLAX) 17 g packet Take 17 g by mouth as needed for constipation.   Yes [provider]  predniSONE (DELTASONE) 5 MG tablet Take 1 tablet (5 mg total) by mouth daily with breakfast. 12/09/18  Yes Causey, Charlestine Massed, NP  albuterol (VENTOLIN HFA) 108 (90 Base) MCG/ACT inhaler Inhale 2 puffs into the lungs every 6 (six) hours as needed for wheezing or shortness of breath. 12/01/18   Rai, Vernelle Emerald, MD  palbociclib (IBRANCE) 100 MG capsule Take 1 capsule (100 mg total) by mouth daily with breakfast. Take whole with food. Take for 21 days on, 7 days off, repeat every 28 days. 12/22/18   [name], Virgie Dad, MD  promethazine-codeine (PHENERGAN WITH CODEINE) 6.25-10 MG/5ML syrup Take 5 mLs by mouth every 8 (eight) hours as needed for cough. 12/09/18   Gardenia Phlegm, NP   Physical Exam: Vitals:   12/25/18 1130 12/25/18 1200 12/25/18 1239 12/25/18 1300  BP: (!) 144/80 (!) 147/97 136/79 (!) 133/99  Pulse: (!) 112 (!) 113 (!) 112 (!) 115  Resp: 20 (!) 22 (!) 22 20  Temp:  98.3 F (36.8 C)    TempSrc:  Oral    SpO2: 100% 100% 98% 99%  Weight:      Height:          General exam: Moderately built and of overweight, in mild distress  Head, eyes and ENT: Nontraumatic and normocephalic. Pupils equally reacting to light and accommodation. Oral mucosa moist.  Neck: Supple. No JVD, carotid bruit or thyromegaly.  Lymphatics: No lymphadenopathy.  Respiratory system: Creased breath sounds, using accessory muscles to breath  Cardiovascular system: S1 and S2 heard, RRR. No JVD, murmurs, gallops, clicks or pedal edema.  Gastrointestinal system: Abdomen is nondistended, soft and nontender. Normal bowel sounds heard. No organomegaly or masses appreciated.  Central nervous system: Alert and oriented. No focal neurological deficits.  Extremities: Symmetric 5 x 5 power. Peripheral pulses symmetrically felt.   Skin: No rashes or acute findings.  Musculoskeletal system: Negative exam.  Psychiatry: Pleasant and cooperative.   Labs on Admission:  Basic Metabolic Panel: Recent Labs  Lab 12/22/18 1219 12/25/18 1009  NA 134* 136  K 3.5 3.6  CL 91* 92*  CO2 34* 33*  GLUCOSE 154* 159*  BUN 15 12  CREATININE 0.63 0.40*  CALCIUM 7.5* 7.0*   Liver Function Tests: Recent Labs  Lab 12/22/18 1219 12/25/18 1009  AST 61* 24  ALT 51* 35  ALKPHOS 247* 235*  BILITOT 0.5 0.6  PROT 6.5 7.0  ALBUMIN 1.9* 2.3*   No results for input(s): LIPASE, AMYLASE in the last 168 hours. No results for input(s): AMMONIA in the last 168 hours. CBC: Recent Labs  Lab 12/22/18 1219 12/25/18 1009  WBC 6.6 8.1  NEUTROABS 4.0 5.0  HGB 7.0* 9.6*  HCT 22.6* 31.6*  MCV 101.3* 104.6*  PLT 141* 214   Cardiac Enzymes: No results for input(s): CKTOTAL, CKMB, CKMBINDEX, TROPONINI in the last 168 hours.  BNP (last 3 results) No results for input(s): PROBNP in the last 8760 hours. CBG: No results for input(s): GLUCAP in the last 168 hours.  Radiological Exams on Admission: Ct Angio Chest Pe W And/or Wo Contrast  Result Date: 12/25/2018 CLINICAL DATA:  Shortness of  breath. History of metastatic breast cancer. EXAM: CT ANGIOGRAPHY CHEST WITH CONTRAST TECHNIQUE: Multidetector CT imaging of the chest was performed using the standard protocol during bolus administration of intravenous contrast. Multiplanar CT image reconstructions and MIPs were obtained to evaluate the vascular anatomy. CONTRAST:  68 mL OMNIPAQUE IOHEXOL 350 MG/ML SOLN COMPARISON:  CT chest 11/17/2018. Single-view of the chest earlier today. Plain films left shoulder 12/20/2018. FINDINGS: Cardiovascular: No pulmonary embolus is identified. Bovine aortic arch is seen. Calcific aortic and coronary artery disease is noted. Heart size is upper. No pericardial effusion. Mediastinum/Nodes: No enlarged mediastinal, hilar, or axillary lymph nodes. Thyroid gland, trachea, and esophagus demonstrate no significant findings. Small hiatal hernia noted. Lungs/Pleura: Small bilateral pleural effusions are seen. Effusion on the left has decreased in size since the prior chest CT. Right effusion is not notably changed. Opacities in both lungs in a band configuration are again seen. Scattered mild ground-glass attenuation is noted. There is basilar atelectasis, worse on the right. Upper Abdomen: Gallstones are identified as on the prior study. Visualized upper abdomen is otherwise unremarkable. Musculoskeletal: Diffuse osseous metastatic disease identified. Pathologic fracture of the left humerus is seen as on the prior plain films of the shoulder. Multiple pathologic rib fractures are also again seen. Pathologic fracture right clavicle noted. Review of the MIP images confirms the above findings. IMPRESSION: Negative for pulmonary embolus or acute disease. Small bilateral pleural effusions. Effusion on the left has decreased since the prior CT. Bandlike opacities in both lungs may be due to scar or fibrosis, unchanged. Diffuse osseous metastatic disease and multiple pathologic fractures as seen on prior studies. Gallstones. Aortic  Atherosclerosis (ICD10-I70.0). Electronically Signed   By: Inge Rise M.D.   On: 12/25/2018 13:11   Dg Chest Port 1 View  Result Date: 12/25/2018 CLINICAL DATA:  58 year old female with shortness of breath. Stage IV breast cancer. EXAM: PORTABLE CHEST 1 VIEW COMPARISON:  Chest CT 11/17/2018. Chest radiograph 12/20/2018 and earlier. FINDINGS: Portable AP semi upright view at 0915 hours. Continued low lung volumes. Continued confluent bibasilar opacity, greater on the right, compatible with a combination of pleural effusion and nonspecific airspace opacity as demonstrated last month by CT. Ventilation has not significantly changed since 12/20/2018. No pneumothorax or pulmonary edema. Stable cardiac  size and mediastinal contours. Diffuse osseous metastatic disease redemonstrated. Pathologic fracture of the medial right clavicle was better demonstrated by CT. IMPRESSION: 1. Ventilation appears not significantly changed from the June CT where bilateral pleural effusions and nonspecific lung base airspace opacity was noted (please see that report). 2. Diffuse osseous metastatic disease, including pathologic fracture of the right clavicle. Electronically Signed   By: Genevie Ann M.D.   On: 12/25/2018 10:13    EKG: Independently reviewed.  Sinus tachycardia.   Assessment/Plan Active Problems:   Bone metastasis (HCC)   Pleural effusion, bilateral   Carcinoma of breast, estrogen receptor positive, stage 4, right (HCC)   Acute on chronic respiratory failure with hypoxia (HCC)   Pneumonia  1-Acute on chronic hypoxic respiratory failure: Probably related to pneumonia. Patient present with fever, worsening hypoxemia.  CT chest was negative for PE though showed chronic fibrosis.  Unable to rule out superimposed infection on CT.  Bilateral pleural effusion appears to be a small. -SARS coronavirus 2-.  Will repeat second test due to markedly elevated inflammatory marker.. -Schedule albuterol. -Will star IV  Solu-Medrol, patient on prednisone. -Placed on BiPAP. -will give one time dose of IV lasix. Monitor BP.  -Follow blood cultures.  2-Stage IV metastatic breast cancer to bone and lung, pleural effusion; Patient currently undergoing radiation.  She has a pathological proximal left humeral diaphysis fracture; pain management for now.  She is supposed to follow with Ortho as an outpatient.  3-Anemia: of Malignancy Patient reports that she received a blood transfusion the day prior to admission.  4-elevated d-dimer:  CT angios negative for PE.  will check Doppler of lower extremity. 5-elevated alkaline phosphatase: Likely related to metastatic bone disease.  6-Chronic diastolic Heart failure;  On oral lasix.  Hold oral lasix.  Will give one time dose IV.  Of note BNP normal.   DVT Prophylaxis: Lovenox Code Status: Full code Family Communication: Daughter over the phone Disposition Plan: Patient presented with acute on chronic hypoxic respiratory failure initially requiring 7 L of oxygen, she will be admitted for treatment of pneumonia and respiratory failure she might require to be on BiPAP.  Time spent: 75 minute.   Elmarie Shiley MD [facility]   12/25/2018, 2:22 PM

## 2018-12-25 NOTE — Progress Notes (Signed)
Pharmacy Antibiotic Note  Suzanne [name] is a 58 y.o. female admitted on 12/25/2018 with pneumonia.  Pharmacy has been consulted for cefepime and vancomycin dosing.  Patient has PMH significant for metastatic breast cancer on Ibrance PTA. Broad spectrum antibiotics started for suspected PNA.  Today, 12/25/18  WBC 8.1  ANC 5  SCr 0.4   PCT < 0.10  Tmax 100.3 F  Plan:  Cefepime 2 g IV q8h  Continue vancomycin 1000 mg IV q12h  Goal AUC 400-550  Follow renal function and culture data  Check vancomycin levels once at steady state if indicated  Height: 5\' 5"  (165.1 cm) Weight: 280 lb (127 kg) IBW/kg (Calculated) : 57  Temp (24hrs), Avg:99.3 F (37.4 C), Min:98.3 F (36.8 C), Max:100.3 F (37.9 C)  Recent Labs  Lab 12/22/18 1219 12/25/18 1009  WBC 6.6 8.1  CREATININE 0.63 0.40*  LATICACIDVEN  --  1.0    Estimated Creatinine Clearance: 101.6 mL/min (A) (by C-G formula based on SCr of 0.4 mg/dL (L)).    No Known Allergies  Antimicrobials this admission: vancomycin 7/30 >>  cefepime 7/30 >>   Dose adjustments this admission:  Microbiology results: 7/30 BCx: Sent 7/30 SARS-2: Negative 7/30 SARS-2: Sent  Thank you for allowing pharmacy to be a part of this patient's care.  Lenis Noon, PharmD 12/25/2018 3:22 PM

## 2018-12-25 NOTE — ED Triage Notes (Signed)
Pt arrives [facility] from home for [facility]. Pt has hx of breast CA, and wears O2 @ 2 lpm at all times. Pt had SAT of 85% when EMS arrived, and was placed on a NRB.

## 2018-12-25 NOTE — Progress Notes (Signed)
Pharmacy Antibiotic Note  Suzanne [name] is a 58 y.o. female with metastatic breast cancer on Ibrance PTA presented to the ED on 12/25/2018 with SOB.  To start vancomycin for suspected PNA.  Plan: - vancomycin 2000 mg IV x1, then 1000 mg IV q12h for est AUC 516  _________________________________________  Height: 5\' 5"  (165.1 cm) Weight: 280 lb (127 kg) IBW/kg (Calculated) : 57  Temp (24hrs), Avg:100.3 F (37.9 C), Min:100.3 F (37.9 C), Max:100.3 F (37.9 C)  Recent Labs  Lab 12/22/18 1219 12/25/18 1009  WBC 6.6 8.1  CREATININE 0.63  --     Estimated Creatinine Clearance: 101.6 mL/min (by C-G formula based on SCr of 0.63 mg/dL).    No Known Allergies   Thank you for allowing pharmacy to be a part of this patient's care.  Lynelle Doctor 12/25/2018 10:49 AM

## 2018-12-25 NOTE — ED Notes (Signed)
Report called to ICU

## 2018-12-25 NOTE — ED Provider Notes (Signed)
[facility] DEPT Provider Note   CSN: [id number] Arrival date & time: 12/25/18  0920     History   Chief Complaint Chief Complaint  Patient presents with   Shortness of Breath    HPI Suzanne [name] is a 58 y.o. female.     The history is provided by the patient and medical records. No language interpreter was used.  Shortness of Breath    58 year old female with history of metastatic breast cancer brought here via EMS from home for evaluation of shortness of breath.  Patient is on home O2 at 2 L.  She developed shortness of breath started earlier today.  History is limited as patient is hard of hearing and unable to give complete story.  She endorsed having shortness of breath, feeling very weak, could not get herself out of the commode this morning requiring help.  When EMS arrived, her O2 sats was at 85%'s, and she was placed on a nonrebreather.  She otherwise denies any significant pain.  She denies any cough nausea or vomiting.  She mention having blood transfusion yesterday.  She mention been tested for COVID-19 several times in the past.  However, history is limited at this time.  Attempted to reach family member via the phone without success.  Level 5 caveats.  Past Medical History:  Diagnosis Date   Cancer [facility])    Family history of breast cancer    Family history of colon cancer    Family history of melanoma    Family history of prostate cancer    Gallstones    Glaucoma    Mastitis    reports history of recurrent mastitis   Metastatic breast cancer ([place])    Obesity     Patient Active Problem List   Diagnosis Date Noted   Bone metastases ([place]) 12/24/2018   Family history of breast cancer    Family history of prostate cancer    Family history of melanoma    Family history of colon cancer    Pressure injury of skin 11/24/2018   Acute on chronic respiratory failure with hypoxia ([place]) 11/19/2018   Pleural  effusion, malignant 11/19/2018   Hypophosphatemia 11/19/2018   Hypocalcemia 11/19/2018   Aspiration pneumonia ([place]) 11/19/2018   SOB (shortness of breath)    Palliative care by specialist    Carcinoma of breast, estrogen receptor positive, stage 4, right ([place]) 11/16/2018   Hypoalbuminemia 11/16/2018   Pleural effusion 11/14/2018   Goals of care, counseling/discussion 11/06/2018   Malignant neoplasm of overlapping sites of right breast in female, estrogen receptor positive ([place]) 10/31/2018   Lung metastasis ([place]) 10/31/2018   Pain from bone metastases ([place]) 10/31/2018   Morbid obesity with BMI of 40.0-44.9, adult ([place]) 10/31/2018   Bone injury    Pleural effusion, bilateral    Shortness of breath    Abnormal breast finding    Hypokalemia    Breast skin changes    Anemia    Bone metastasis (HCC)    Acute anemia 10/26/2018    Past Surgical History:  Procedure Laterality Date   GLAUCOMA SURGERY  late 1990's   IR THORACENTESIS ASP PLEURAL SPACE W/IMG GUIDE  10/28/2018   IR THORACENTESIS ASP PLEURAL SPACE W/IMG GUIDE  10/29/2018     OB History   No obstetric history on file.      Home Medications    Prior to Admission medications   Medication Sig Start Date End Date Taking? Authorizing Provider  albuterol (VENTOLIN  HFA) 108 (90 Base) MCG/ACT inhaler Inhale 2 puffs into the lungs every 6 (six) hours as needed for wheezing or shortness of breath. 12/01/18   Rai, Ripudeep Raliegh Ip, MD  ALPRAZolam (XANAX) 0.25 MG tablet TK 1-2 TS PO Q 8 H PRN FOR ANXIETY 12/19/18   [provider]  anastrozole (ARIMIDEX) 1 MG tablet Take 1 tablet (1 mg total) by mouth daily. 12/12/18   [name], Virgie Dad, MD  benzonatate (TESSALON) 200 MG capsule TAKE 1 CAPSULE BY MOUTH 3 TIMES DAILY IF NEEDED 12/23/18   [name], Virgie Dad, MD  calcium-vitamin D (OSCAL WITH D) 500-200 MG-UNIT tablet Take 1 tablet by mouth 3 (three) times daily. 11/30/18   Rai, Vernelle Emerald, MD  carvedilol (COREG)  6.25 MG tablet Take 1 tablet (6.25 mg total) by mouth 2 (two) times daily with a meal. 12/01/18   Rai, Ripudeep K, MD  furosemide (LASIX) 20 MG tablet Take 2 tablets (40 mg total) by mouth 2 (two) times daily. 12/09/18   Gardenia Phlegm, NP  loratadine (CLARITIN) 10 MG tablet Take 1 tablet (10 mg total) by mouth daily. 11/29/18   Rai, Ripudeep K, MD  nystatin (MYCOSTATIN/NYSTOP) powder Apply topically 3 (three) times daily. Apply under breast area 11/28/18   Rai, Ripudeep K, MD  oxyCODONE (OXY IR/ROXICODONE) 5 MG immediate release tablet Take 0.5 tablets (2.5 mg total) by mouth every 4 (four) hours as needed for moderate pain or severe pain. 11/29/18   Rai, Vernelle Emerald, MD  palbociclib (IBRANCE) 100 MG capsule Take 1 capsule (100 mg total) by mouth daily with breakfast. Take whole with food. Take for 21 days on, 7 days off, repeat every 28 days. 12/22/18   [name], Virgie Dad, MD  polyethylene glycol (MIRALAX / GLYCOLAX) 17 g packet Take 17 g by mouth as needed for constipation.    [provider]  predniSONE (DELTASONE) 5 MG tablet Take 1 tablet (5 mg total) by mouth daily with breakfast. 12/09/18   Causey, Charlestine Massed, NP  promethazine-codeine (PHENERGAN WITH CODEINE) 6.25-10 MG/5ML syrup Take 5 mLs by mouth every 8 (eight) hours as needed for cough. 12/09/18   Gardenia Phlegm, NP    Family History Family History  Problem Relation Age of Onset   Breast cancer Sister        in her 73's-70s   Heart disease Brother        CABG   Heart disease Brother        CABG   Skin cancer Brother        melanoma   Colon cancer Cousin    Heart attack Mother    Heart attack Father    Prostate cancer Brother     Social History Social History   Tobacco Use   Smoking status: Never Smoker   Smokeless tobacco: Never Used   Tobacco comment: second hand smoke exposure  Substance Use Topics   Alcohol use: No   Drug use: No     Allergies   Patient has no known  allergies.   Review of Systems Review of Systems  Unable to perform ROS: Acuity of condition  Respiratory: Positive for shortness of breath.      Physical Exam Updated Vital Signs BP (!) 170/106    Pulse (!) 124    Temp 100.3 F (37.9 C) (Rectal)    Resp (!) 37    Ht 5\' 5"  (1.651 m)    Wt 127 kg    LMP 05/28/2013 (Within Months)  SpO2 97%    BMI 46.59 kg/m   Physical Exam Vitals signs and nursing note reviewed.  Constitutional:      General: She is in acute distress (Moderate respiratory discomfort.).     Appearance: She is well-developed. She is obese.  HENT:     Head: Atraumatic.  Eyes:     Conjunctiva/sclera: Conjunctivae normal.  Neck:     Musculoskeletal: Neck supple.  Cardiovascular:     Rate and Rhythm: Tachycardia present.  Pulmonary:     Effort: Tachypnea, accessory muscle usage and respiratory distress present.     Breath sounds: Decreased breath sounds present.  Abdominal:     Palpations: Abdomen is soft.  Musculoskeletal:     Right lower leg: Edema present.     Left lower leg: Edema present.  Skin:    Findings: No rash.  Neurological:     Mental Status: She is alert. She is disoriented.  Psychiatric:        Mood and Affect: Mood is anxious.      ED Treatments / Results  Labs (all labs ordered are listed, but only abnormal results are displayed) Labs Reviewed  CBC WITH DIFFERENTIAL/PLATELET - Abnormal; Notable for the following components:      Result Value   RBC 3.02 (*)    Hemoglobin 9.6 (*)    HCT 31.6 (*)    MCV 104.6 (*)    RDW 19.7 (*)    nRBC 5.3 (*)    Abs Immature Granulocytes 0.19 (*)    All other components within normal limits  COMPREHENSIVE METABOLIC PANEL - Abnormal; Notable for the following components:   Chloride 92 (*)    CO2 33 (*)    Glucose, Bld 159 (*)    Creatinine, Ser 0.40 (*)    Calcium 7.0 (*)    Albumin 2.3 (*)    Alkaline Phosphatase 235 (*)    All other components within normal limits  D-DIMER,  QUANTITATIVE (NOT AT [facility]) - Abnormal; Notable for the following components:   D-Dimer, Quant 7.92 (*)    All other components within normal limits  LACTATE DEHYDROGENASE - Abnormal; Notable for the following components:   LDH 244 (*)    All other components within normal limits  FERRITIN - Abnormal; Notable for the following components:   Ferritin 2,306 (*)    All other components within normal limits  FIBRINOGEN - Abnormal; Notable for the following components:   Fibrinogen >800 (*)    All other components within normal limits  C-REACTIVE PROTEIN - Abnormal; Notable for the following components:   CRP 19.6 (*)    All other components within normal limits  SARS CORONAVIRUS 2 (HOSPITAL ORDER, [place] [facility])  CULTURE, BLOOD (ROUTINE X 2)  CULTURE, BLOOD (ROUTINE X 2)  SARS CORONAVIRUS 2 (HOSPITAL ORDER, PERFORMED [place] [facility])  BRAIN NATRIURETIC PEPTIDE  LACTIC ACID, PLASMA  PROCALCITONIN  TRIGLYCERIDES  LACTIC ACID, PLASMA  I-STAT BETA HCG BLOOD, ED ([facility], [facility], [facility] ONLY)    EKG None  ED ECG REPORT   Date: 12/25/2018  Rate: 124  Rhythm: sinus tachycardia  QRS Axis: normal  Intervals: normal  ST/T Wave abnormalities: nonspecific T wave changes  Conduction Disutrbances:none  Narrative Interpretation:   Old EKG Reviewed: changes noted  I have personally reviewed the EKG tracing and agree with the computerized printout as noted.   Radiology Ct Angio Chest Pe W And/or Wo Contrast  Result Date: 12/25/2018 CLINICAL DATA:  Shortness  of breath. History of metastatic breast cancer. EXAM: CT ANGIOGRAPHY CHEST WITH CONTRAST TECHNIQUE: Multidetector CT imaging of the chest was performed using the standard protocol during bolus administration of intravenous contrast. Multiplanar CT image reconstructions and MIPs were obtained to evaluate the vascular anatomy. CONTRAST:  68 mL OMNIPAQUE IOHEXOL 350 MG/ML SOLN COMPARISON:  CT chest 11/17/2018.  Single-view of the chest earlier today. Plain films left shoulder 12/20/2018. FINDINGS: Cardiovascular: No pulmonary embolus is identified. Bovine aortic arch is seen. Calcific aortic and coronary artery disease is noted. Heart size is upper. No pericardial effusion. Mediastinum/Nodes: No enlarged mediastinal, hilar, or axillary lymph nodes. Thyroid gland, trachea, and esophagus demonstrate no significant findings. Small hiatal hernia noted. Lungs/Pleura: Small bilateral pleural effusions are seen. Effusion on the left has decreased in size since the prior chest CT. Right effusion is not notably changed. Opacities in both lungs in a band configuration are again seen. Scattered mild ground-glass attenuation is noted. There is basilar atelectasis, worse on the right. Upper Abdomen: Gallstones are identified as on the prior study. Visualized upper abdomen is otherwise unremarkable. Musculoskeletal: Diffuse osseous metastatic disease identified. Pathologic fracture of the left humerus is seen as on the prior plain films of the shoulder. Multiple pathologic rib fractures are also again seen. Pathologic fracture right clavicle noted. Review of the MIP images confirms the above findings. IMPRESSION: Negative for pulmonary embolus or acute disease. Small bilateral pleural effusions. Effusion on the left has decreased since the prior CT. Bandlike opacities in both lungs may be due to scar or fibrosis, unchanged. Diffuse osseous metastatic disease and multiple pathologic fractures as seen on prior studies. Gallstones. Aortic Atherosclerosis (ICD10-I70.0). Electronically Signed   By: Inge Rise M.D.   On: 12/25/2018 13:11   Dg Chest Port 1 View  Result Date: 12/25/2018 CLINICAL DATA:  57 year old female with shortness of breath. Stage IV breast cancer. EXAM: PORTABLE CHEST 1 VIEW COMPARISON:  Chest CT 11/17/2018. Chest radiograph 12/20/2018 and earlier. FINDINGS: Portable AP semi upright view at 0915 hours. Continued  low lung volumes. Continued confluent bibasilar opacity, greater on the right, compatible with a combination of pleural effusion and nonspecific airspace opacity as demonstrated last month by CT. Ventilation has not significantly changed since 12/20/2018. No pneumothorax or pulmonary edema. Stable cardiac size and mediastinal contours. Diffuse osseous metastatic disease redemonstrated. Pathologic fracture of the medial right clavicle was better demonstrated by CT. IMPRESSION: 1. Ventilation appears not significantly changed from the June CT where bilateral pleural effusions and nonspecific lung base airspace opacity was noted (please see that report). 2. Diffuse osseous metastatic disease, including pathologic fracture of the right clavicle. Electronically Signed   By: Genevie Ann M.D.   On: 12/25/2018 10:13    Procedures .Critical Care Performed by: Domenic Moras, PA-C Authorized by: Domenic Moras, PA-C   Critical care provider statement:    Critical care time (minutes):  45   Critical care was time spent personally by me on the following activities:  Discussions with consultants, evaluation of patient's response to treatment, examination of patient, ordering and performing treatments and interventions, ordering and review of laboratory studies, ordering and review of radiographic studies, pulse oximetry, re-evaluation of patient's condition, obtaining history from patient or surrogate and review of old charts   (including critical care time)  Medications Ordered in ED Medications  albuterol (VENTOLIN HFA) 108 (90 Base) MCG/ACT inhaler 6 puff (6 puffs Inhalation Given 12/25/18 1138)  vancomycin (VANCOCIN) IVPB 1000 mg/200 mL premix (has no administration in time range)  acetaminophen (TYLENOL) tablet 650 mg (650 mg Oral Given 12/25/18 1136)  ceFEPIme (MAXIPIME) 1 g in sodium chloride 0.9 % 100 mL IVPB (0 g Intravenous Stopped 12/25/18 1207)  vancomycin (VANCOCIN) 2,000 mg in sodium chloride 0.9 % 500 mL  IVPB (2,000 mg Intravenous New Bag/Given 12/25/18 1138)  iohexol (OMNIPAQUE) 350 MG/ML injection 100 mL (68 mLs Intravenous Contrast Given 12/25/18 1229)  sodium chloride (PF) 0.9 % injection (  Contrast Given 12/25/18 1253)     Initial Impression / Assessment and Plan / ED Course  I have reviewed the triage vital signs and the nursing notes.  Pertinent labs & imaging results that were available during my care of the patient were reviewed by me and considered in my medical decision making (see chart for details).        BP (!) 142/91 (BP Location: Left Arm)    Pulse (!) 117    Temp 100.3 F (37.9 C) (Rectal)    Resp (!) 21    Ht 5\' 5"  (1.651 m)    Wt 127 kg    LMP 05/28/2013 (Within Months)    SpO2 99%    BMI 46.59 kg/m    Final Clinical Impressions(s) / ED Diagnoses   Final diagnoses:  Acute respiratory disorder in immunocompromised patient ([place])  HCAP (healthcare-associated pneumonia)    ED Discharge Orders    None     10:08 AM Patient with history of metastatic breast cancer here with acute onset of shortness of breath.  She does have a low-grade temperature of 100.3, she is tachypneic, tachycardic, and is having accessory muscle use.  She was initially placed on nonrebreather after her O2 sats was at 85% on 2 L.  In the room, she is satting at 100% on 8 L.  I decrease it down to 2 L, and she endorsed increase shortness of breath.  O2 sats continued to drop down to 92 therefore she is now placed on 6 L of O2 and appears more comfortable.  Given history of cancer, there is potential for PE.  However, with a low-grade fever, consider COVID-19 as a potential cause as well as pneumonia.  Prior discussing with Dr. Sedonia Small who will also manage patient care.  10:19 AM I was able to get in touch with patient's daughter via the phone.  Daughter was in the impression that patient overexerted herself going to several different appointments yesterday and therefore she developed increased  shortness of breath throughout last night, having difficulty sleeping.  They did not recall any cough or any fever.  We will continue with work-up.  Patient is currently a full CODE STATUS.  SHELVIE SALSBERRY was evaluated in Emergency Department on 12/25/2018 for the symptoms described in the history of present illness. She was evaluated in the context of the global COVID-19 pandemic, which necessitated consideration that the patient might be at risk for infection with the SARS-CoV-2 virus that causes COVID-19. Institutional protocols and algorithms that pertain to the evaluation of patients at risk for COVID-19 are in a state of rapid change based on information released by regulatory bodies including the CDC and federal and state organizations. These policies and algorithms were followed during the patient's care in the ED.  11:32 AM Fortunately COVID-19 testing today is negative.  Patient does have elevated d-dimer of 7.92, with normal renal function, will obtain chest CT angiogram to rule out PE.  1:55 PM Fortunately no evidence of PE.  Evidence of pleural effusion slightly improved from prior.  COVID-19 test is negative.  However in the setting of acute respiratory failure from an underlying illness, appreciate consultation from [facility] Dr. Tyrell Antonio who agrees to see and evaluate and admit patient for further care.  Patient's daughter has been notified of the plan via the phone.   Domenic Moras, PA-C 12/25/18 1356    Sedonia Small Barth Kirks, MD 12/26/18 0700

## 2018-12-25 NOTE — ED Notes (Signed)
Patient transported to CT 

## 2018-12-25 NOTE — Telephone Encounter (Signed)
PATIENT HAS AN APPT. WITH DR. Alphonzo Severance (ORTHOPEDIC SURGERY)  ON 01-01-19 @ 10:45 AM

## 2018-12-26 ENCOUNTER — Telehealth: Payer: Self-pay | Admitting: Licensed Clinical Social Worker

## 2018-12-26 ENCOUNTER — Inpatient Hospital Stay (HOSPITAL_COMMUNITY): Payer: Medicaid Other

## 2018-12-26 ENCOUNTER — Encounter: Payer: Self-pay | Admitting: Nutrition

## 2018-12-26 DIAGNOSIS — A4101 Sepsis due to Methicillin susceptible Staphylococcus aureus: Principal | ICD-10-CM

## 2018-12-26 DIAGNOSIS — C7800 Secondary malignant neoplasm of unspecified lung: Secondary | ICD-10-CM

## 2018-12-26 DIAGNOSIS — C7951 Secondary malignant neoplasm of bone: Secondary | ICD-10-CM

## 2018-12-26 DIAGNOSIS — R0602 Shortness of breath: Secondary | ICD-10-CM

## 2018-12-26 DIAGNOSIS — L89152 Pressure ulcer of sacral region, stage 2: Secondary | ICD-10-CM

## 2018-12-26 DIAGNOSIS — C50911 Malignant neoplasm of unspecified site of right female breast: Secondary | ICD-10-CM

## 2018-12-26 DIAGNOSIS — R413 Other amnesia: Secondary | ICD-10-CM

## 2018-12-26 DIAGNOSIS — F329 Major depressive disorder, single episode, unspecified: Secondary | ICD-10-CM

## 2018-12-26 DIAGNOSIS — M84411D Pathological fracture, right shoulder, subsequent encounter for fracture with routine healing: Secondary | ICD-10-CM

## 2018-12-26 DIAGNOSIS — C779 Secondary and unspecified malignant neoplasm of lymph node, unspecified: Secondary | ICD-10-CM

## 2018-12-26 DIAGNOSIS — R7881 Bacteremia: Secondary | ICD-10-CM

## 2018-12-26 DIAGNOSIS — Z79899 Other long term (current) drug therapy: Secondary | ICD-10-CM

## 2018-12-26 DIAGNOSIS — C7801 Secondary malignant neoplasm of right lung: Secondary | ICD-10-CM

## 2018-12-26 DIAGNOSIS — C7802 Secondary malignant neoplasm of left lung: Secondary | ICD-10-CM

## 2018-12-26 DIAGNOSIS — Z170 Estrogen receptor positive status [ER+]: Secondary | ICD-10-CM

## 2018-12-26 DIAGNOSIS — R609 Edema, unspecified: Secondary | ICD-10-CM

## 2018-12-26 LAB — BASIC METABOLIC PANEL
Anion gap: 8 (ref 5–15)
BUN: 13 mg/dL (ref 6–20)
CO2: 35 mmol/L — ABNORMAL HIGH (ref 22–32)
Calcium: 6.1 mg/dL — CL (ref 8.9–10.3)
Chloride: 96 mmol/L — ABNORMAL LOW (ref 98–111)
Creatinine, Ser: 0.44 mg/dL (ref 0.44–1.00)
GFR calc Af Amer: >60 mL/min (ref >60)
GFR calc non Af Amer: >60 mL/min (ref >60)
Glucose, Bld: 172 mg/dL — ABNORMAL HIGH (ref 70–99)
Potassium: 3.2 mmol/L — ABNORMAL LOW (ref 3.5–5.1)
Sodium: 139 mmol/L (ref 135–145)

## 2018-12-26 LAB — CBC
HCT: 27.8 % — ABNORMAL LOW (ref 36.0–46.0)
Hemoglobin: 8.2 g/dL — ABNORMAL LOW (ref 12.0–15.0)
MCH: 31.3 pg (ref 26.0–34.0)
MCHC: 29.5 g/dL — ABNORMAL LOW (ref 30.0–36.0)
MCV: 106.1 fL — ABNORMAL HIGH (ref 80.0–100.0)
Platelets: 226 10*3/uL (ref 150–400)
RBC: 2.62 MIL/uL — ABNORMAL LOW (ref 3.87–5.11)
RDW: 19.7 % — ABNORMAL HIGH (ref 11.5–15.5)
WBC: 6.6 10*3/uL (ref 4.0–10.5)
nRBC: 2.9 % — ABNORMAL HIGH (ref 0.0–0.2)

## 2018-12-26 LAB — BLOOD CULTURE ID PANEL (REFLEXED)

## 2018-12-26 LAB — ECHOCARDIOGRAM COMPLETE
Height: 63 in
Weight: 3086.44 oz

## 2018-12-26 MED ORDER — CEFAZOLIN SODIUM-DEXTROSE 2-4 GM/100ML-% IV SOLN
2.0000 g | Freq: Three times a day (TID) | INTRAVENOUS | Status: AC
  Administered 2018-12-26 – 2019-01-09 (×44): 2 g via INTRAVENOUS
  Filled 2018-12-26 (×49): qty 100

## 2018-12-26 MED ORDER — POTASSIUM CHLORIDE CRYS ER 20 MEQ PO TBCR
40.0000 meq | EXTENDED_RELEASE_TABLET | Freq: Two times a day (BID) | ORAL | Status: AC
  Administered 2018-12-26 (×2): 40 meq via ORAL
  Filled 2018-12-26 (×2): qty 2

## 2018-12-26 MED ORDER — CALCIUM GLUCONATE-NACL 2-0.675 GM/100ML-% IV SOLN
2.0000 g | Freq: Once | INTRAVENOUS | Status: AC
  Administered 2018-12-26: 2000 mg via INTRAVENOUS
  Filled 2018-12-26: qty 100

## 2018-12-26 MED ORDER — ENOXAPARIN SODIUM 40 MG/0.4ML ~~LOC~~ SOLN
40.0000 mg | SUBCUTANEOUS | Status: DC
  Administered 2018-12-26 – 2019-01-16 (×21): 40 mg via SUBCUTANEOUS
  Filled 2018-12-26 (×22): qty 0.4

## 2018-12-26 MED ORDER — IPRATROPIUM-ALBUTEROL 0.5-2.5 (3) MG/3ML IN SOLN
3.0000 mL | Freq: Three times a day (TID) | RESPIRATORY_TRACT | Status: DC
  Administered 2018-12-27 – 2018-12-28 (×4): 3 mL via RESPIRATORY_TRACT
  Filled 2018-12-26 (×4): qty 3

## 2018-12-26 NOTE — TOC Initial Note (Signed)
Transition of Care [facility]) - Initial/Assessment Note    Patient Details  Name: Suzanne [name] MRN: [id number] Date of Birth: 1961/02/23  [facility]) CM/SW Contact:    Nila Nephew, LCSW Phone Number: [phone] 12/26/2018, 12:52 PM  Clinical Narrative:    Pt   Admitted with respiratory failure- possible pneumonia. Is patient of cancer center also.  Pt has charity home health with [facility]- representative aware of admission. Pt in need of wheelchair per [facility] and daughter- CSW contacted [employer] to inquire about pt getting charity-covered wheelchair (Pt's daughter is in process of Medicaid application, pending Medicaid but not active).             Expected Discharge Plan and Services           Expected Discharge Date: (unknown)                                    Prior Living Arrangements/Services             [place]          Activities of Daily Living Home Assistive Devices/Equipment: Bedside commode/3-in-1, Oxygen, Walker (specify type), Eyeglasses, Other (Comment)(tub/shower unit, front wheeled walker) ADL Screening (condition at time of admission) Patient's cognitive ability adequate to safely complete daily activities?: No Is the patient deaf or have difficulty hearing?: No Does the patient have difficulty seeing, even when wearing glasses/contacts?: No Does the patient have difficulty concentrating, remembering, or making decisions?: Yes Patient able to express need for assistance with ADLs?: No Does the patient have difficulty dressing or bathing?: Yes Independently performs ADLs?: No Communication: Needs assistance(patient mumbling and not coherent) Is this a change from baseline?: Change from baseline, expected to last >3 days Dressing (OT): Dependent Is this a change from baseline?: Change from baseline, expected to last >3 days Grooming: Dependent Is this a change from baseline?: Change from baseline, expected to last >3  days Feeding: Dependent Is this a change from baseline?: Change from baseline, expected to last >3 days Bathing: Dependent Is this a change from baseline?: Change from baseline, expected to last >3 days Toileting: Dependent Is this a change from baseline?: Change from baseline, expected to last >3days In/Out Bed: Dependent Is this a change from baseline?: Change from baseline, expected to last >3 days Walks in Home: Dependent Is this a change from baseline?: Change from baseline, expected to last >3 days Does the patient have difficulty walking or climbing stairs?: Yes(secondary to weakness) Weakness of Legs: Both Weakness of Arms/Hands: Left(patient has pathological fx of left humerus-L>R)  Permission Sought/Granted                  Emotional Assessment              Admission diagnosis:  HCAP (healthcare-associated pneumonia) [J18.9] Acute respiratory disorder in immunocompromised patient ([place]) [H47.4, D84.9] Respiratory failure, acute ([place]) [J96.00] Patient Active Problem List   Diagnosis Date Noted  . Pneumonia 12/25/2018  . Respiratory failure, acute ([place]) 12/25/2018  . Bone metastases ([place]) 12/24/2018  . Family history of breast cancer   . Family history of prostate cancer   . Family history of melanoma   . Family history of colon cancer   . Pressure injury of skin 11/24/2018  . Acute on chronic respiratory failure with hypoxia ([place]) 11/19/2018  . Pleural effusion, malignant 11/19/2018  . Hypophosphatemia 11/19/2018  . Hypocalcemia 11/19/2018  .  Aspiration pneumonia ([place]) 11/19/2018  . SOB (shortness of breath)   . Palliative care by specialist   . Carcinoma of breast, estrogen receptor positive, stage 4, right ([place]) 11/16/2018  . Hypoalbuminemia 11/16/2018  . Pleural effusion 11/14/2018  . Goals of care, counseling/discussion 11/06/2018  . Malignant neoplasm of overlapping sites of right breast in female, estrogen receptor positive ([place]) 10/31/2018  .  Lung metastasis ([place]) 10/31/2018  . Pain from bone metastases ([place]) 10/31/2018  . Morbid obesity with BMI of 40.0-44.9, adult ([place]) 10/31/2018  . Bone injury   . Pleural effusion, bilateral   . Shortness of breath   . Abnormal breast finding   . Hypokalemia   . Breast skin changes   . Anemia   . Bone metastasis ([place])   . Acute anemia 10/26/2018   PCP:  Patient, No Pcp Per Pharmacy:   [facility] [place], [place] - [address] AT [place] [address] Phone: [phone] Fax: [facility] [address] [place] [address] Phone: [phone] Fax: [phone]  [id number] - [place], [address] AT [address] [place] [address] Phone: [phone] Fax: [place], [place]. [place] [address] Phone: [phone] Fax: [phone]     Social Determinants of Health (SDOH) Interventions    Readmission Risk Interventions Readmission Risk Prevention Plan 12/01/2018 11/17/2018  Transportation Screening Complete Complete  PCP or Specialist Appt within 3-5 Days - Not Complete  Not Complete comments - will need pcp set up  [place] or [place] - Complete  Social Work Consult for [place] Planning/Counseling - Complete  Palliative Care Screening - Not Complete  Palliative Care Screening Not Complete Comments - pending- bedside RN states plan to consult palliative this admission  Medication Review ([place]) Complete Complete  PCP or Specialist appointment within 3-5 days of discharge Complete -  [place] or Home Care Consult Complete -  SW Recovery Care/Counseling Consult Complete -  Palliative Care Screening Not Applicable -  Skilled Nursing Facility Complete -  Some recent data might be  hidden

## 2018-12-26 NOTE — Consult Note (Signed)
Suzanne [name] for Infectious Disease       Reason for Consult: MSSA sepsis    Referring Physician: Terrilyn Saver auto-consult for BSI  Active Problems:   Bone metastasis ([place])   Pleural effusion, bilateral   Carcinoma of breast, estrogen receptor positive, stage 4, right (HCC)   Acute on chronic respiratory failure with hypoxia ([place])   Pneumonia   Respiratory failure, acute ([place])   . anastrozole  1 mg Oral Daily  . calcium-vitamin D  1 tablet Oral TID  . carvedilol  6.25 mg Oral BID WC  . chlorhexidine  15 mL Mouth Rinse BID  . Chlorhexidine Gluconate Cloth  6 each Topical Q0600  . docusate sodium  100 mg Oral BID  . enoxaparin (LOVENOX) injection  40 mg Subcutaneous Q24H  . ipratropium-albuterol  3 mL Nebulization Q6H  . loratadine  10 mg Oral Daily  . mouth rinse  15 mL Mouth Rinse q12n4p  . methylPREDNISolone (SOLU-MEDROL) injection  60 mg Intravenous Q12H  . nystatin   Topical TID  . potassium chloride  40 mEq Oral BID  . sodium chloride flush  3 mL Intravenous Q12H    Recommendations: 1. MSSA sepsis -the most probable source patient's current MSSA sepsis would be her small sacral wound.  Fortunately, there is no active purulence from the site, so surgical intervention should not be needed at this time provided that her repeat blood cultures promptly clear.  Patient's recent neutropenia also placed her at predisposing risk for infection as well.  Patient does not have any invasive lines that require explantation.  Agree with Ancef 2 g IV every 8 hours for the present time.  Follow-up transthoracic echocardiogram and repeat blood culture results to assess for clearance and to assist with determining duration of antibiotic therapy.  If no signs of endocarditis are noted on her echocardiogram and her blood cultures promptly clear, she may be eligible for 2 weeks of antibiotic treatment.  2. SOB -given imaging and the patient's known history of right malignant pleural effusion,  I suspect that this is the case once again.  Her chest imaging shows no focal pneumonia but does show significant abnormalities and potential scarring in a bandlike pattern throughout her chest.  We will narrow her antibiotic spectrum as above.  3. Metastatic breast CA -while the patient was only recently diagnosed with breast cancer approximately 2 months ago.  She is already confirmed to have advanced staging with metastasis to her lungs bones and lymph nodes.  She is poorly tolerated neoadjuvant chemotherapy thus far with complications of neutropenia and thrombocytopenia.  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  Patient's functional status is steadily declined to the point that she has now developed a sacral decubitus which is most likely the source of her recurrent MSSA sepsis.  While her sepsis would explain her fatigue and confusion in the last 2 to 3 days, this is unlikely to have caused such functional decline as to result in her decubital wound.  As we are treating the patient's sepsis, I would urge her oncologist to keep in mind her poor progression and consider palliation as the patient is unlikely to qualify for the physical rehab she would need in order to heal her sacral decubitus and avoid further wounds appearing. This will leave her at high risk for further episodes of sepsis moving forward..  Assessment: The patient is a 58 y/o WF with recently diagnosed stage IV/metastatic breast CA on neoadjuvant chemotherapy  complicated by recurrent malignant pleural effusions and pathologic fxs admitted with SOB and subsequently found to have MSSA sepsis.  Antibiotics: Cefazolin, day 1 + 1 day of vanc/cefepime  HPI: Suzanne [name] is a 58 y.o. female with stage IV breast CA with known metastases to bone, lung, and lymph nodes and malignant pleural effusion admitted on 12/25/2018 with SOB/AMS.  The patient was diagnosed with metastatic breast cancer  in early June 2020.  At the time of diagnosis she was found to have widespread metastasis and pathologic fracture in her right clavicle.  She has subsequently undergone neoadjuvant chemotherapy but has also had her treatment complicated by recurrent malignant right-sided pleural effusions.  Per the patient's report she is continue take an oral chemotherapy as an outpatient for her estrogen receptor positive breast cancer.  Her response to chemotherapy has been limited as well due to neutropenia and thrombocytopenia brought on by treatment.  Patient lives with her adult daughter at home who have cerebral palsy and is been unable to assist her mother with physical rehab or movement within the home.  She admits that she does not ambulate much aside from going to from either the bed to the couch or from the couch to the bathroom.  Ambulation beyond the simple tasks leaves the patient extremely short of breath and fatigued.  She admits that her appetite has declined in the past 2 to 3 weeks in particular.  As a result of the patient's immobility she has developed a stage II-III sacral decubital wound.  The patient developed worsening shortness of breath and per her family's report also developed altered mental status leading to her admission here.  Although the patient has been afebrile without leukocytosis, blood cultures were obtained and are now positive for MSSA.  She was empirically started on vancomycin and cefepime which has now been narrowed to cefazolin.  A transthoracic echocardiogram was performed earlier today is currently awaiting cardiology interpretation. Fever curve, WBC trends, ABX usage, imaging, oncology notes, and cx results all independently reviewed.  Review of Systems:  Review of Systems  Constitutional: Positive for chills and fever. Negative for weight loss.  HENT: Negative for congestion, hearing loss, sinus pain and sore throat.   Eyes: Negative for blurred vision, photophobia and  discharge.  Respiratory: Positive for shortness of breath. Negative for cough and hemoptysis.   Cardiovascular: Negative for chest pain, palpitations, orthopnea and leg swelling.  Gastrointestinal: Positive for nausea. Negative for abdominal pain, constipation, diarrhea, heartburn and vomiting.  Genitourinary: Negative for dysuria, flank pain, frequency and urgency.  Musculoskeletal: Positive for joint pain. Negative for back pain and myalgias.  Skin: Negative for itching and rash.  Neurological: Positive for weakness. Negative for tremors, seizures and headaches.  Endo/Heme/Allergies: Negative for polydipsia. Does not bruise/bleed easily.  Psychiatric/Behavioral: Positive for depression and memory loss. Negative for substance abuse. The patient is not nervous/anxious and does not have insomnia.      All other systems reviewed and are negative    Past Medical History:  Diagnosis Date  . Cancer ([place])   . Family history of breast cancer   . Family history of colon cancer   . Family history of melanoma   . Family history of prostate cancer   . Gallstones   . Glaucoma   . Mastitis    reports history of recurrent mastitis  . Metastatic breast cancer ([place])   . Obesity   pathologic fx to RT clavicle Malignant pleural effusion, dx'ed in early 10/2018  Social History   Tobacco Use  . Smoking status: Never Smoker  . Smokeless tobacco: Never Used  . Tobacco comment: second hand smoke exposure  Substance Use Topics  . Alcohol use: No  . Drug use: No  lives at [facility] adult daughter who has CP; since her CA dx, her sister from [place] has been visiting frequently to assist with her care  Family History  Problem Relation Age of Onset  . Breast cancer Sister        in her 59's-70s  . Heart disease Brother        CABG  . Heart disease Brother        CABG  . Skin cancer Brother        melanoma  . Colon cancer Cousin   . Heart attack Mother   . Heart attack Father   . Prostate  cancer Brother   daughter - cerebral palsy   Current Facility-Administered Medications:  .  0.9 %  sodium chloride infusion, 250 mL, Intravenous, PRN, Regalado, Belkys A, MD .  acetaminophen (TYLENOL) tablet 650 mg, 650 mg, Oral, Q6H PRN **OR** acetaminophen (TYLENOL) suppository 650 mg, 650 mg, Rectal, Q6H PRN, Regalado, Belkys A, MD .  ALPRAZolam (XANAX) tablet 0.25-0.5 mg, 0.25-0.5 mg, Oral, Q8H PRN, Regalado, Belkys A, MD .  anastrozole (ARIMIDEX) tablet 1 mg, 1 mg, Oral, Daily, Regalado, Belkys A, MD, 1 mg at 12/26/18 0919 .  antiseptic oral rinse (BIOTENE) solution 15 mL, 15 mL, Mouth Rinse, PRN, Regalado, Belkys A, MD .  calcium gluconate 2 g/ 100 mL sodium chloride IVPB, 2 g, Intravenous, Once, Donne Hazel, MD .  calcium-vitamin D (OSCAL WITH D) 500-200 MG-UNIT per tablet 1 tablet, 1 tablet, Oral, TID, Regalado, Belkys A, MD, 1 tablet at 12/26/18 0915 .  carvedilol (COREG) tablet 6.25 mg, 6.25 mg, Oral, BID WC, Regalado, Belkys A, MD, 6.25 mg at 12/26/18 0915 .  ceFAZolin (ANCEF) IVPB 2g/100 mL premix, 2 g, Intravenous, Q8H, Regalado, Belkys A, MD, Stopped at 12/26/18 0545 .  chlorhexidine (PERIDEX) 0.12 % solution 15 mL, 15 mL, Mouth Rinse, BID, Regalado, Belkys A, MD, 15 mL at 12/26/18 0916 .  Chlorhexidine Gluconate Cloth 2 % PADS 6 each, 6 each, Topical, Q0600, Regalado, Belkys A, MD .  docusate sodium (COLACE) capsule 100 mg, 100 mg, Oral, BID, Regalado, Belkys A, MD, 100 mg at 12/26/18 0915 .  enoxaparin (LOVENOX) injection 40 mg, 40 mg, Subcutaneous, Q24H, Bell, Michelle T, RPH .  ipratropium-albuterol (DUONEB) 0.5-2.5 (3) MG/3ML nebulizer solution 3 mL, 3 mL, Nebulization, Q6H, Regalado, Belkys A, MD, 3 mL at 12/26/18 0733 .  loratadine (CLARITIN) tablet 10 mg, 10 mg, Oral, Daily, Regalado, Belkys A, MD, 10 mg at 12/26/18 0915 .  MEDLINE mouth rinse, 15 mL, Mouth Rinse, q12n4p, Regalado, Belkys A, MD, 15 mL at 12/26/18 1132 .  methylPREDNISolone sodium succinate (SOLU-MEDROL)  125 mg/2 mL injection 60 mg, 60 mg, Intravenous, Q12H, Regalado, Belkys A, MD, 60 mg at 12/26/18 0519 .  nystatin (MYCOSTATIN/NYSTOP) topical powder, , Topical, TID, Regalado, Belkys A, MD .  ondansetron (ZOFRAN) tablet 4 mg, 4 mg, Oral, Q6H PRN **OR** ondansetron (ZOFRAN) injection 4 mg, 4 mg, Intravenous, Q6H PRN, Regalado, Belkys A, MD .  oxyCODONE (Oxy IR/ROXICODONE) immediate release tablet 2.5 mg, 2.5 mg, Oral, Q4H PRN, Regalado, Belkys A, MD .  polyethylene glycol (MIRALAX / GLYCOLAX) packet 17 g, 17 g, Oral, Daily PRN, Regalado, Belkys A, MD .  potassium chloride SA (K-DUR) CR  tablet 40 mEq, 40 mEq, Oral, BID, Donne Hazel, MD, 40 mEq at 12/26/18 1045 .  sodium chloride flush (NS) 0.9 % injection 3 mL, 3 mL, Intravenous, Q12H, Regalado, Belkys A, MD, 3 mL at 12/26/18 0919 .  sodium chloride flush (NS) 0.9 % injection 3 mL, 3 mL, Intravenous, PRN, Regalado, Belkys A, MD  No Known Allergies  Vitals:   12/26/18 1200 12/26/18 1300  BP: (!) 146/63 (!) 152/58  Pulse: 87 87  Resp: (!) 32 (!) 33  Temp: 98.3 F (36.8 C)   SpO2: 96% 96%     Physical Exam Lines: PIV, Foley Gen: pleasant, chronically ill, appears older than stated age, moderate distress secondary to breathing and MSK pain, A&Ox 3 Head: NCAT, no temporal wasting evident EENT: PERRL, EOMI, MMM, adequate dentition Neck: supple, mild JVD CV: NRRR, no murmurs evident Pulm: CTA bilaterally but decreased BS at RT base, no wheeze, occasional retractions Abd: soft, NTND, +BS (hypoactive) Extrems:  2+ non-pitting LE edema, 2+ pulses MSK: stage II-III sime sized sacral ulcer w/o purulence and minimal surrounding erythema Skin: no rashes, adequate skin turgor Neuro: CN II-XII grossly intact, no focal neurologic deficits appreciated, gait was not assessed, A&Ox 3   Lab Results  Component Value Date   WBC 6.6 12/26/2018   HGB 8.2 (L) 12/26/2018   HCT 27.8 (L) 12/26/2018   MCV 106.1 (H) 12/26/2018   PLT 226 12/26/2018     Lab Results  Component Value Date   CREATININE 0.44 12/26/2018   BUN 13 12/26/2018   NA 139 12/26/2018   K 3.2 (L) 12/26/2018   CL 96 (L) 12/26/2018   CO2 35 (H) 12/26/2018    Lab Results  Component Value Date   ALT 35 12/25/2018   AST 24 12/25/2018   ALKPHOS 235 (H) 12/25/2018     Microbiology: Recent Results (from the past 240 hour(s))  SARS Coronavirus 2 [facility] order, Performed in [facility] hospital lab)     Status: None   Collection Time: 12/25/18 10:09 AM   Specimen: Nasopharyngeal Swab  Result Value Ref Range Status   SARS Coronavirus 2 NEGATIVE NEGATIVE Final    Comment: (NOTE) If result is NEGATIVE SARS-CoV-2 target nucleic acids are NOT DETECTED. The SARS-CoV-2 RNA is generally detectable in upper and lower  respiratory specimens during the acute phase of infection. The lowest  concentration of SARS-CoV-2 viral copies this assay can detect is 250  copies / mL. A negative result does not preclude SARS-CoV-2 infection  and should not be used as the sole basis for treatment or other  patient management decisions.  A negative result may occur with  improper specimen collection / handling, submission of specimen other  than nasopharyngeal swab, presence of viral mutation(s) within the  areas targeted by this assay, and inadequate number of viral copies  (<250 copies / mL). A negative result must be combined with clinical  observations, patient history, and epidemiological information. If result is POSITIVE SARS-CoV-2 target nucleic acids are DETECTED. The SARS-CoV-2 RNA is generally detectable in upper and lower  respiratory specimens dur ing the acute phase of infection.  Positive  results are indicative of active infection with SARS-CoV-2.  Clinical  correlation with patient history and other diagnostic information is  necessary to determine patient infection status.  Positive results do  not rule out bacterial infection or co-infection with other viruses. If  result is PRESUMPTIVE POSTIVE SARS-CoV-2 nucleic acids MAY BE PRESENT.   A presumptive positive result was  obtained on the submitted specimen  and confirmed on repeat testing.  While 2019 novel coronavirus  (SARS-CoV-2) nucleic acids may be present in the submitted sample  additional confirmatory testing may be necessary for epidemiological  and / or clinical management purposes  to differentiate between  SARS-CoV-2 and other Sarbecovirus currently known to infect humans.  If clinically indicated additional testing with an alternate test  methodology [phone]) is advised. The SARS-CoV-2 RNA is generally  detectable in upper and lower respiratory sp ecimens during the acute  phase of infection. The expected result is Negative. Fact Sheet for Patients:  StrictlyIdeas.no Fact Sheet for Healthcare Providers: BankingDealers.co.za This test is not yet approved or cleared by the [place] FDA and has been authorized for detection and/or diagnosis of SARS-CoV-2 by FDA under an Emergency Use Authorization (EUA).  This EUA will remain in effect (meaning this test can be used) for the duration of the COVID-19 declaration under Section 564(b)(1) of the Act, 21 U.S.C. section 360bbb-3(b)(1), unless the authorization is terminated or revoked sooner. Performed at [facility], [place] [address], [place], [place] [address]   Blood Culture (routine x 2)     Status: None (Preliminary result)   Collection Time: 12/25/18 10:09 AM   Specimen: BLOOD  Result Value Ref Range Status   Specimen Description   Final    BLOOD RIGHT ANTECUBITAL Performed at [place] [address] [place], [place] [address]    Special Requests   Final    BOTTLES DRAWN AEROBIC AND ANAEROBIC Blood Culture adequate volume Performed at [place] [address] [place], [place] [address]    Culture  Setup Time    Final    GRAM POSITIVE COCCI IN CLUSTERS IN BOTH AEROBIC AND ANAEROBIC BOTTLES CRITICAL RESULT CALLED TO, READ BACK BY AND VERIFIED WITH: Audrea Muscat [id number] 12/26/2018 Mena Goes Performed at [facility], [place] [address], [place],  [address]    Culture GRAM POSITIVE COCCI  Final   Report Status PENDING  Incomplete  Blood Culture ID Panel (Reflexed)     Status: Abnormal   Collection Time: 12/25/18 10:09 AM  Result Value Ref Range Status   Enterococcus species NOT DETECTED NOT DETECTED Final   Listeria monocytogenes NOT DETECTED NOT DETECTED Final   Staphylococcus species DETECTED (A) NOT DETECTED Final    Comment: CRITICAL RESULT CALLED TO, READ BACK BY AND VERIFIED WITH: J. Fabiola Backer [id number] 12/26/2018 T. TYSOR    Staphylococcus aureus (BCID) DETECTED (A) NOT DETECTED Final    Comment: Methicillin (oxacillin) susceptible Staphylococcus aureus (MSSA). Preferred therapy is anti staphylococcal beta lactam antibiotic (Cefazolin or Nafcillin), unless clinically contraindicated. CRITICAL RESULT CALLED TO, READ BACK BY AND VERIFIED WITH: J. Fabiola Backer [id number] 12/26/2018 T. TYSOR    Methicillin resistance NOT DETECTED NOT DETECTED Final   Streptococcus species NOT DETECTED NOT DETECTED Final   Streptococcus agalactiae NOT DETECTED NOT DETECTED Final   Streptococcus pneumoniae NOT DETECTED NOT DETECTED Final   Streptococcus pyogenes NOT DETECTED NOT DETECTED Final   Acinetobacter baumannii NOT DETECTED NOT DETECTED Final   Enterobacteriaceae species NOT DETECTED NOT DETECTED Final   Enterobacter cloacae complex NOT DETECTED NOT DETECTED Final   Escherichia coli NOT DETECTED NOT DETECTED Final   Klebsiella oxytoca NOT DETECTED NOT DETECTED Final   Klebsiella pneumoniae NOT DETECTED NOT DETECTED Final   Proteus species NOT DETECTED NOT DETECTED Final   Serratia marcescens NOT DETECTED NOT DETECTED Final   Haemophilus influenzae NOT  DETECTED NOT DETECTED Final   Neisseria  meningitidis NOT DETECTED NOT DETECTED Final   Pseudomonas aeruginosa NOT DETECTED NOT DETECTED Final   Candida albicans NOT DETECTED NOT DETECTED Final   Candida glabrata NOT DETECTED NOT DETECTED Final   Candida krusei NOT DETECTED NOT DETECTED Final   Candida parapsilosis NOT DETECTED NOT DETECTED Final   Candida tropicalis NOT DETECTED NOT DETECTED Final    Comment: Performed at [facility], [place] [address], [place], [place] [address]  Blood Culture (routine x 2)     Status: None (Preliminary result)   Collection Time: 12/25/18 10:15 AM   Specimen: BLOOD LEFT HAND  Result Value Ref Range Status   Specimen Description   Final    BLOOD LEFT HAND Performed at [place] [address] [place], [place] [address]    Special Requests   Final    BOTTLES DRAWN AEROBIC AND ANAEROBIC Blood Culture results may not be optimal due to an inadequate volume of blood received in culture bottles Performed at [facility] [address] [place], [place] [address]    Culture  Setup Time   Final    GRAM POSITIVE COCCI IN CLUSTERS IN BOTH AEROBIC AND ANAEROBIC BOTTLES CRITICAL VALUE NOTED.  VALUE IS CONSISTENT WITH PREVIOUSLY REPORTED AND CALLED VALUE. Performed at [facility], [place] [address] [place], [place] [address]    Culture [facility] POSITIVE COCCI  Final   Report Status PENDING  Incomplete  SARS Coronavirus 2 (CEPHEID - Performed in [place] hospital lab), Hosp Order     Status: None   Collection Time: 12/25/18  2:44 PM   Specimen: Nasopharyngeal Swab  Result Value Ref Range Status   SARS Coronavirus 2 NEGATIVE NEGATIVE Final    Comment: (NOTE) If result is NEGATIVE SARS-CoV-2 target nucleic acids are NOT DETECTED. The SARS-CoV-2 RNA is generally detectable in upper and lower  respiratory specimens during the acute phase of infection. The lowest  concentration of SARS-CoV-2 viral copies this assay can detect is 250  copies / mL. A  negative result does not preclude SARS-CoV-2 infection  and should not be used as the sole basis for treatment or other  patient management decisions.  A negative result may occur with  improper specimen collection / handling, submission of specimen other  than nasopharyngeal swab, presence of viral mutation(s) within the  areas targeted by this assay, and inadequate number of viral copies  (<250 copies / mL). A negative result must be combined with clinical  observations, patient history, and epidemiological information. If result is POSITIVE SARS-CoV-2 target nucleic acids are DETECTED. The SARS-CoV-2 RNA is generally detectable in upper and lower  respiratory specimens dur ing the acute phase of infection.  Positive  results are indicative of active infection with SARS-CoV-2.  Clinical  correlation with patient history and other diagnostic information is  necessary to determine patient infection status.  Positive results do  not rule out bacterial infection or co-infection with other viruses. If result is PRESUMPTIVE POSTIVE SARS-CoV-2 nucleic acids MAY BE PRESENT.   A presumptive positive result was obtained on the submitted specimen  and confirmed on repeat testing.  While 2019 novel coronavirus  (SARS-CoV-2) nucleic acids may be present in the submitted sample  additional confirmatory testing may be necessary for epidemiological  and / or clinical management purposes  to differentiate between  SARS-CoV-2 and other Sarbecovirus currently known to infect humans.  If clinically indicated additional testing with  an alternate test  methodology [phone]) is advised. The SARS-CoV-2 RNA is generally  detectable in upper and lower respiratory sp ecimens during the acute  phase of infection. The expected result is Negative. Fact Sheet for Patients:  StrictlyIdeas.no Fact Sheet for Healthcare Providers: BankingDealers.co.za This test is not  yet approved or cleared by the [place] FDA and has been authorized for detection and/or diagnosis of SARS-CoV-2 by FDA under an Emergency Use Authorization (EUA).  This EUA will remain in effect (meaning this test can be used) for the duration of the COVID-19 declaration under Section 564(b)(1) of the Act, 21 U.S.C. section 360bbb-3(b)(1), unless the authorization is terminated or revoked sooner. Performed at [facility], [place] [address], [place], [place] [address]   MRSA PCR Screening     Status: None   Collection Time: 12/25/18  5:16 PM   Specimen: Nasal Mucosa; Nasopharyngeal  Result Value Ref Range Status   MRSA by PCR NEGATIVE NEGATIVE Final    Comment:        The GeneXpert MRSA Assay (FDA approved for NASAL specimens only), is one component of a comprehensive MRSA colonization surveillance program. It is not intended to diagnose MRSA infection nor to guide or monitor treatment for MRSA infections. Performed at [facility], [place] [address], [place], [place] [address]     Jayven Naill N Aquila Menzie, [place] for Infectious Disease [email or web address] 12/26/2018, 1:44 PM

## 2018-12-26 NOTE — Progress Notes (Signed)
Pt. In no distress at this time.  Bipap not indicated as  Patient has been off all shift and has tolerated well.

## 2018-12-26 NOTE — Progress Notes (Signed)
PROGRESS NOTE    Suzanne [name]  MPN:[id number] DOB: 1961-04-18 DOA: 12/25/2018 PCP: Patient, No Pcp Per    Brief Narrative:  58 y.o. 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  Patient was sitting bedside commode and she was not able to stand up this morning and she developed worsening shortness of breath.  She denies chest pain, cough, sore throat.  Her thought that patient has been tired because of multiple appointments. Patient appears mildly confused.  She is still having some shortness of breath.  She denies chest pain, abdominal pain.  Evaluation in the ED; patient was noted to be febrile temperature 100, tachycardic.  Labs sodium 136, potassium 3.6, CO2 33, BUN 12 calcium 7.0, alkaline phosphatase 235, albumin 2.3, LDH 244, ferritin 2306, CRP 19, procalcitonin less than 0.10, d-dimer 7.9, fibrinogen more than 800, white blood cell 8.1, hemoglobin 9.6, platelets 214.  BNP 89.  X-ray:Ventilation appears not significantly changed from the June CT where bilateral pleural effusions and nonspecific lung base airspace opacity was noted (please see that report). Diffuse osseous metastatic disease, including pathologic fracture of the right clavicle.  CT angio; Negative for pulmonary embolus or acute disease. Small bilateral pleural effusions. Effusion on the left has decreased since the prior CT. Bandlike opacities in both lungs may be due to scar or fibrosis, unchanged.     Assessment & Plan:   Active Problems:   Bone metastasis (HCC)   Pleural effusion, bilateral   Carcinoma of breast,  estrogen receptor positive, stage 4, right (HCC)   Acute on chronic respiratory failure with hypoxia (HCC)   Pneumonia   Respiratory failure, acute ([place])  1-Acute on chronic hypoxic respiratory failure: Probably related to pneumonia. Patient present with fever, worsening hypoxemia.  CT chest was negative for PE though showed chronic fibrosis.  Unable to rule out superimposed infection on CT.  Bilateral pleural effusion appears to be a small. -SARS coronavirus test neg x 2 -On scheduled albuterol. -Now on IV Solu-Medrol -Weaned off bipap, on minimal O2 support -Blood cx pos for MSSA. See below  2-Stage IV metastatic breast cancer to bone and lung, pleural effusion; Patient currently undergoing radiation.  She has a pathological proximal left humeral diaphysis fracture; pain management for now.  She is supposed to follow with Ortho as an outpatient. -Appreciate input by Oncology  3-Anemia: of Malignancy Patient reports that she received a blood transfusion the day prior to admission. -Hemodynamically stable at present -Repeat cbc in AM  4-elevated d-dimer:  CT angios negative for PE. LE dopplers reviewed, neg for DVT  5-elevated alkaline phosphatase: Likely related to metastatic bone disease. -Stable at present  6-Chronic diastolic Heart failure;  Given dose of IV lasix at time of presentation Of note BNP normal on presentation  7-MSSA bacteremia -Currently afebrile -No leukocytosis -On ancef. ID consulted -Have ordered repeat 2d echo  DVT prophylaxis: Lovenox subQ Code Status: Full Family Communication: Pt in room, family not at bedside Disposition Plan: Uncertain at this time  Consultants:   Oncology  ID  Procedures:   LE dopplers 7/31, neg for dvt  Antimicrobials: Anti-infectives (From admission, onward)   Start  Dose/Rate Route Frequency Ordered Stop   12/26/18 0600  ceFAZolin (ANCEF) IVPB 2g/100 mL premix     2 g 200 mL/hr over 30 Minutes  Intravenous Every 8 hours 12/26/18 0445     12/25/18 2300  vancomycin (VANCOCIN) IVPB 1000 mg/200 mL premix  Status:  Discontinued     1,000 mg 200 mL/hr over 60 Minutes Intravenous Every 12 hours 12/25/18 1101 12/26/18 0444   12/25/18 2200  ceFEPIme (MAXIPIME) 2 g in sodium chloride 0.9 % 100 mL IVPB  Status:  Discontinued     2 g 200 mL/hr over 30 Minutes Intravenous Every 8 hours 12/25/18 1447 12/26/18 0444   12/25/18 1515  ceFEPIme (MAXIPIME) 1 g in sodium chloride 0.9 % 100 mL IVPB     1 g 200 mL/hr over 30 Minutes Intravenous  Once 12/25/18 1446 12/25/18 1728   12/25/18 1115  vancomycin (VANCOCIN) 2,000 mg in sodium chloride 0.9 % 500 mL IVPB     2,000 mg 250 mL/hr over 120 Minutes Intravenous  Once 12/25/18 1101 12/25/18 1405   12/25/18 1045  ceFEPIme (MAXIPIME) 1 g in sodium chloride 0.9 % 100 mL IVPB     1 g 200 mL/hr over 30 Minutes Intravenous  Once 12/25/18 1022 12/25/18 1207       Subjective: Reports feeling better today  Objective: Vitals:   12/26/18 0900 12/26/18 1100 12/26/18 1200 12/26/18 1300  BP: (!) 140/46 137/68 (!) 146/63 (!) 152/58  Pulse: 90 92 87 87  Resp: (!) 24 20 (!) 32 (!) 33  Temp:   98.3 F (36.8 C)   TempSrc:   Oral   SpO2: 98% 98% 96% 96%  Weight:      Height:        Intake/Output Summary (Last 24 hours) at 12/26/2018 1352 Last data filed at 12/26/2018 9379 Gross per 24 hour  Intake 1108.6 ml  Output --  Net 1108.6 ml   Filed Weights   12/25/18 0947 12/26/18 0500  Weight: 127 kg 87.5 kg    Examination:  General exam: Appears calm and comfortable  Respiratory system: Clear to auscultation. Respiratory effort normal. Cardiovascular system: S1 & S2 heard, RRR Gastrointestinal system: Abdomen is nondistended, soft and nontender. No organomegaly or masses felt. Normal bowel sounds heard. Central nervous system: Alert and oriented. No focal neurological deficits. Extremities: Symmetric 5 x 5 power. Skin: No rashes, lesions   Psychiatry: Judgement and insight appear normal. Mood & affect appropriate.   Data Reviewed: I have personally reviewed following labs and imaging studies  CBC: Recent Labs  Lab 12/22/18 1219 12/25/18 1009 12/26/18 0235  WBC 6.6 8.1 6.6  NEUTROABS 4.0 5.0  --   HGB 7.0* 9.6* 8.2*  HCT 22.6* 31.6* 27.8*  MCV 101.3* 104.6* 106.1*  PLT 141* 214 024   Basic Metabolic Panel: Recent Labs  Lab 12/22/18 1219 12/25/18 1009 12/26/18 0235  NA 134* 136 139  K 3.5 3.6 3.2*  CL 91* 92* 96*  CO2 34* 33* 35*  GLUCOSE 154* 159* 172*  BUN 15 12 13   CREATININE 0.63 0.40* 0.44  CALCIUM 7.5* 7.0* 6.1*   GFR: Estimated Creatinine Clearance: 79.4 mL/min (by C-G formula based on SCr of 0.44 mg/dL). Liver Function Tests: Recent Labs  Lab 12/22/18 1219 12/25/18 1009  AST 61* 24  ALT 51* 35  ALKPHOS 247* 235*  BILITOT 0.5 0.6  PROT 6.5 7.0  ALBUMIN 1.9* 2.3*   No results for input(s): LIPASE, AMYLASE in the last 168 hours. No results  for input(s): AMMONIA in the last 168 hours. Coagulation Profile: No results for input(s): INR, PROTIME in the last 168 hours. Cardiac Enzymes: No results for input(s): CKTOTAL, CKMB, CKMBINDEX, TROPONINI in the last 168 hours. BNP (last 3 results) No results for input(s): PROBNP in the last 8760 hours. HbA1C: No results for input(s): HGBA1C in the last 72 hours. CBG: No results for input(s): GLUCAP in the last 168 hours. Lipid Profile: Recent Labs    12/25/18 1009  TRIG 108   Thyroid Function Tests: No results for input(s): TSH, T4TOTAL, FREET4, T3FREE, THYROIDAB in the last 72 hours. Anemia Panel: Recent Labs    12/25/18 1009  FERRITIN 2,306*   Sepsis Labs: Recent Labs  Lab 12/25/18 1009  PROCALCITON <0.10  LATICACIDVEN 1.0    Recent Results (from the past 240 hour(s))  SARS Coronavirus 2 [facility] order, Performed in [place] hospital lab)     Status: None   Collection Time: 12/25/18 10:09 AM   Specimen: Nasopharyngeal Swab   Result Value Ref Range Status   SARS Coronavirus 2 NEGATIVE NEGATIVE Final    Comment: (NOTE) If result is NEGATIVE SARS-CoV-2 target nucleic acids are NOT DETECTED. The SARS-CoV-2 RNA is generally detectable in upper and lower  respiratory specimens during the acute phase of infection. The lowest  concentration of SARS-CoV-2 viral copies this assay can detect is 250  copies / mL. A negative result does not preclude SARS-CoV-2 infection  and should not be used as the sole basis for treatment or other  patient management decisions.  A negative result may occur with  improper specimen collection / handling, submission of specimen other  than nasopharyngeal swab, presence of viral mutation(s) within the  areas targeted by this assay, and inadequate number of viral copies  (<250 copies / mL). A negative result must be combined with clinical  observations, patient history, and epidemiological information. If result is POSITIVE SARS-CoV-2 target nucleic acids are DETECTED. The SARS-CoV-2 RNA is generally detectable in upper and lower  respiratory specimens dur ing the acute phase of infection.  Positive  results are indicative of active infection with SARS-CoV-2.  Clinical  correlation with patient history and other diagnostic information is  necessary to determine patient infection status.  Positive results do  not rule out bacterial infection or co-infection with other viruses. If result is PRESUMPTIVE POSTIVE SARS-CoV-2 nucleic acids MAY BE PRESENT.   A presumptive positive result was obtained on the submitted specimen  and confirmed on repeat testing.  While 2019 novel coronavirus  (SARS-CoV-2) nucleic acids may be present in the submitted sample  additional confirmatory testing may be necessary for epidemiological  and / or clinical management purposes  to differentiate between  SARS-CoV-2 and other Sarbecovirus currently known to infect humans.  If clinically indicated additional  testing with an alternate test  methodology [phone]) is advised. The SARS-CoV-2 RNA is generally  detectable in upper and lower respiratory sp ecimens during the acute  phase of infection. The expected result is Negative. Fact Sheet for Patients:  StrictlyIdeas.no Fact Sheet for Healthcare Providers: BankingDealers.co.za This test is not yet approved or cleared by the [place] FDA and has been authorized for detection and/or diagnosis of SARS-CoV-2 by FDA under an Emergency Use Authorization (EUA).  This EUA will remain in effect (meaning this test can be used) for the duration of the COVID-19 declaration under Section 564(b)(1) of the Act, 21 U.S.C. section 360bbb-3(b)(1), unless the authorization is terminated or revoked sooner. Performed at [facility]  [place] [address], [place], [place] [address]   Blood Culture (routine x 2)     Status: None (Preliminary result)   Collection Time: 12/25/18 10:09 AM   Specimen: BLOOD  Result Value Ref Range Status   Specimen Description   Final    BLOOD RIGHT ANTECUBITAL Performed at [place] [address], [place], [place] [address]    Special Requests   Final    BOTTLES DRAWN AEROBIC AND ANAEROBIC Blood Culture adequate volume Performed at [place] [address], [place], [place] [address]    Culture  Setup Time   Final    GRAM POSITIVE COCCI IN CLUSTERS IN BOTH AEROBIC AND ANAEROBIC BOTTLES CRITICAL RESULT CALLED TO, READ BACK BY AND VERIFIED WITH: Audrea Muscat [id number] 12/26/2018 Mena Goes Performed at [facility], [place] [address] [place], [place] [address]    Culture GRAM POSITIVE COCCI  Final   Report Status PENDING  Incomplete  Blood Culture ID Panel (Reflexed)     Status: Abnormal   Collection Time: 12/25/18 10:09 AM  Result Value Ref Range Status   Enterococcus species NOT DETECTED NOT DETECTED Final    Listeria monocytogenes NOT DETECTED NOT DETECTED Final   Staphylococcus species DETECTED (A) NOT DETECTED Final    Comment: CRITICAL RESULT CALLED TO, READ BACK BY AND VERIFIED WITH: J. Fabiola Backer [id number] 12/26/2018 T. TYSOR    Staphylococcus aureus (BCID) DETECTED (A) NOT DETECTED Final    Comment: Methicillin (oxacillin) susceptible Staphylococcus aureus (MSSA). Preferred therapy is anti staphylococcal beta lactam antibiotic (Cefazolin or Nafcillin), unless clinically contraindicated. CRITICAL RESULT CALLED TO, READ BACK BY AND VERIFIED WITH: J. Fabiola Backer [id number] 12/26/2018 T. TYSOR    Methicillin resistance NOT DETECTED NOT DETECTED Final   Streptococcus species NOT DETECTED NOT DETECTED Final   Streptococcus agalactiae NOT DETECTED NOT DETECTED Final   Streptococcus pneumoniae NOT DETECTED NOT DETECTED Final   Streptococcus pyogenes NOT DETECTED NOT DETECTED Final   Acinetobacter baumannii NOT DETECTED NOT DETECTED Final   Enterobacteriaceae species NOT DETECTED NOT DETECTED Final   Enterobacter cloacae complex NOT DETECTED NOT DETECTED Final   Escherichia coli NOT DETECTED NOT DETECTED Final   Klebsiella oxytoca NOT DETECTED NOT DETECTED Final   Klebsiella pneumoniae NOT DETECTED NOT DETECTED Final   Proteus species NOT DETECTED NOT DETECTED Final   Serratia marcescens NOT DETECTED NOT DETECTED Final   Haemophilus influenzae NOT DETECTED NOT DETECTED Final   Neisseria meningitidis NOT DETECTED NOT DETECTED Final   Pseudomonas aeruginosa NOT DETECTED NOT DETECTED Final   Candida albicans NOT DETECTED NOT DETECTED Final   Candida glabrata NOT DETECTED NOT DETECTED Final   Candida krusei NOT DETECTED NOT DETECTED Final   Candida parapsilosis NOT DETECTED NOT DETECTED Final   Candida tropicalis NOT DETECTED NOT DETECTED Final    Comment: Performed at [facility], [address], [place], [place] [address]  Blood Culture (routine x 2)     Status: None (Preliminary result)     Collection Time: 12/25/18 10:15 AM   Specimen: BLOOD LEFT HAND  Result Value Ref Range Status   Specimen Description   Final    BLOOD LEFT HAND Performed at [place] [address] [place], [place] [address]    Special Requests   Final    BOTTLES DRAWN AEROBIC AND ANAEROBIC Blood Culture results may not be optimal due to an inadequate volume of blood received in culture bottles Performed at [facility]  [place] [address] [place], [place] [address]    Culture  Setup Time   Final    GRAM POSITIVE COCCI IN CLUSTERS IN BOTH AEROBIC AND ANAEROBIC BOTTLES CRITICAL VALUE NOTED.  VALUE IS CONSISTENT WITH PREVIOUSLY REPORTED AND CALLED VALUE. Performed at [facility], [place] [address] [place] [address] [place], [place] [address]    Culture [facility] POSITIVE COCCI  Final   Report Status PENDING  Incomplete  SARS Coronavirus 2 (CEPHEID - Performed in [place] hospital lab), Hosp Order     Status: None   Collection Time: 12/25/18  2:44 PM   Specimen: Nasopharyngeal Swab  Result Value Ref Range Status   SARS Coronavirus 2 NEGATIVE NEGATIVE Final    Comment: (NOTE) If result is NEGATIVE SARS-CoV-2 target nucleic acids are NOT DETECTED. The SARS-CoV-2 RNA is generally detectable in upper and lower  respiratory specimens during the acute phase of infection. The lowest  concentration of SARS-CoV-2 viral copies this assay can detect is 250  copies / mL. A negative result does not preclude SARS-CoV-2 infection  and should not be used as the sole basis for treatment or other  patient management decisions.  A negative result may occur with  improper specimen collection / handling, submission of specimen other  than nasopharyngeal swab, presence of viral mutation(s) within the  areas targeted by this assay, and inadequate number of viral copies  (<250 copies / mL). A negative result must be combined with clinical  observations, patient history, and epidemiological  information. If result is POSITIVE SARS-CoV-2 target nucleic acids are DETECTED. The SARS-CoV-2 RNA is generally detectable in upper and lower  respiratory specimens dur ing the acute phase of infection.  Positive  results are indicative of active infection with SARS-CoV-2.  Clinical  correlation with patient history and other diagnostic information is  necessary to determine patient infection status.  Positive results do  not rule out bacterial infection or co-infection with other viruses. If result is PRESUMPTIVE POSTIVE SARS-CoV-2 nucleic acids MAY BE PRESENT.   A presumptive positive result was obtained on the submitted specimen  and confirmed on repeat testing.  While 2019 novel coronavirus  (SARS-CoV-2) nucleic acids may be present in the submitted sample  additional confirmatory testing may be necessary for epidemiological  and / or clinical management purposes  to differentiate between  SARS-CoV-2 and other Sarbecovirus currently known to infect humans.  If clinically indicated additional testing with an alternate test  methodology [phone]) is advised. The SARS-CoV-2 RNA is generally  detectable in upper and lower respiratory sp ecimens during the acute  phase of infection. The expected result is Negative. Fact Sheet for Patients:  StrictlyIdeas.no Fact Sheet for Healthcare Providers: BankingDealers.co.za This test is not yet approved or cleared by the [place] FDA and has been authorized for detection and/or diagnosis of SARS-CoV-2 by FDA under an Emergency Use Authorization (EUA).  This EUA will remain in effect (meaning this test can be used) for the duration of the COVID-19 declaration under Section 564(b)(1) of the Act, 21 U.S.C. section 360bbb-3(b)(1), unless the authorization is terminated or revoked sooner. Performed at [facility], [place] [address] [place], [place] [address]   MRSA PCR  Screening     Status: None   Collection Time: 12/25/18  5:16 PM   Specimen: Nasal Mucosa; Nasopharyngeal  Result Value Ref Range Status   MRSA by PCR NEGATIVE NEGATIVE Final    Comment:        The GeneXpert  MRSA Assay (FDA approved for NASAL specimens only), is one component of a comprehensive MRSA colonization surveillance program. It is not intended to diagnose MRSA infection nor to guide or monitor treatment for MRSA infections. Performed at [facility], [place] [address], [place], [place] [address]      Radiology Studies: Ct Angio Chest Pe W And/or Wo Contrast  Result Date: 12/25/2018 CLINICAL DATA:  Shortness of breath. History of metastatic breast cancer. EXAM: CT ANGIOGRAPHY CHEST WITH CONTRAST TECHNIQUE: Multidetector CT imaging of the chest was performed using the standard protocol during bolus administration of intravenous contrast. Multiplanar CT image reconstructions and MIPs were obtained to evaluate the vascular anatomy. CONTRAST:  68 mL OMNIPAQUE IOHEXOL 350 MG/ML SOLN COMPARISON:  CT chest 11/17/2018. Single-view of the chest earlier today. Plain films left shoulder 12/20/2018. FINDINGS: Cardiovascular: No pulmonary embolus is identified. Bovine aortic arch is seen. Calcific aortic and coronary artery disease is noted. Heart size is upper. No pericardial effusion. Mediastinum/Nodes: No enlarged mediastinal, hilar, or axillary lymph nodes. Thyroid gland, trachea, and esophagus demonstrate no significant findings. Small hiatal hernia noted. Lungs/Pleura: Small bilateral pleural effusions are seen. Effusion on the left has decreased in size since the prior chest CT. Right effusion is not notably changed. Opacities in both lungs in a band configuration are again seen. Scattered mild ground-glass attenuation is noted. There is basilar atelectasis, worse on the right. Upper Abdomen: Gallstones are identified as on the prior study. Visualized upper abdomen is otherwise  unremarkable. Musculoskeletal: Diffuse osseous metastatic disease identified. Pathologic fracture of the left humerus is seen as on the prior plain films of the shoulder. Multiple pathologic rib fractures are also again seen. Pathologic fracture right clavicle noted. Review of the MIP images confirms the above findings. IMPRESSION: Negative for pulmonary embolus or acute disease. Small bilateral pleural effusions. Effusion on the left has decreased since the prior CT. Bandlike opacities in both lungs may be due to scar or fibrosis, unchanged. Diffuse osseous metastatic disease and multiple pathologic fractures as seen on prior studies. Gallstones. Aortic Atherosclerosis (ICD10-I70.0). Electronically Signed   By: Inge Rise M.D.   On: 12/25/2018 13:11   Dg Chest Port 1 View  Result Date: 12/25/2018 CLINICAL DATA:  58 year old female with shortness of breath. Stage IV breast cancer. EXAM: PORTABLE CHEST 1 VIEW COMPARISON:  Chest CT 11/17/2018. Chest radiograph 12/20/2018 and earlier. FINDINGS: Portable AP semi upright view at 0915 hours. Continued low lung volumes. Continued confluent bibasilar opacity, greater on the right, compatible with a combination of pleural effusion and nonspecific airspace opacity as demonstrated last month by CT. Ventilation has not significantly changed since 12/20/2018. No pneumothorax or pulmonary edema. Stable cardiac size and mediastinal contours. Diffuse osseous metastatic disease redemonstrated. Pathologic fracture of the medial right clavicle was better demonstrated by CT. IMPRESSION: 1. Ventilation appears not significantly changed from the June CT where bilateral pleural effusions and nonspecific lung base airspace opacity was noted (please see that report). 2. Diffuse osseous metastatic disease, including pathologic fracture of the right clavicle. Electronically Signed   By: Genevie Ann M.D.   On: 12/25/2018 10:13   Vas [place] Lower Extremity Venous (dvt)  Result Date:  12/26/2018  Lower Venous Study Indications: Edema.  Comparison Study: previous study done 11/17/18 Performing Technologist: Abram Sander RVS  Examination Guidelines: A complete evaluation includes B-mode imaging, spectral Doppler, color Doppler, and power Doppler as needed of all accessible portions of each vessel. Bilateral testing is considered an integral part of a complete  examination. Limited examinations for reoccurring indications may be performed as noted.  +---------+---------------+---------+-----------+----------+--------------+  RIGHT     Compressibility Phasicity Spontaneity Properties Summary         +---------+---------------+---------+-----------+----------+--------------+  CFV       Full            Yes       Yes                                    +---------+---------------+---------+-----------+----------+--------------+  SFJ       Full                                                             +---------+---------------+---------+-----------+----------+--------------+  FV Prox   Full                                                             +---------+---------------+---------+-----------+----------+--------------+  FV Mid    Full                                                             +---------+---------------+---------+-----------+----------+--------------+  FV Distal Full                                                             +---------+---------------+---------+-----------+----------+--------------+  PFV       Full                                                             +---------+---------------+---------+-----------+----------+--------------+  POP       Full            Yes       Yes                                    +---------+---------------+---------+-----------+----------+--------------+  PTV       Full                                                             +---------+---------------+---------+-----------+----------+--------------+  PERO  Not visualized  +---------+---------------+---------+-----------+----------+--------------+   +---------+---------------+---------+-----------+----------+--------------+  LEFT      Compressibility Phasicity Spontaneity Properties Summary         +---------+---------------+---------+-----------+----------+--------------+  CFV       Full            Yes       Yes                                    +---------+---------------+---------+-----------+----------+--------------+  SFJ       Full                                                             +---------+---------------+---------+-----------+----------+--------------+  FV Prox   Full                                                             +---------+---------------+---------+-----------+----------+--------------+  FV Mid    Full                                                             +---------+---------------+---------+-----------+----------+--------------+  FV Distal Full                                                             +---------+---------------+---------+-----------+----------+--------------+  PFV       Full                                                             +---------+---------------+---------+-----------+----------+--------------+  POP       Full            Yes       Yes                                    +---------+---------------+---------+-----------+----------+--------------+  PTV       Full                                                             +---------+---------------+---------+-----------+----------+--------------+  PERO  Not visualized  +---------+---------------+---------+-----------+----------+--------------+     Summary: Right: There is no evidence of deep vein thrombosis in the lower extremity. Left: No evidence of deep vein thrombosis in the lower extremity.  *See table(s) above for measurements and observations. Electronically signed by  Curt Jews MD on 12/26/2018 at 1:51:48 PM.    Final     Scheduled Meds:  anastrozole  1 mg Oral Daily   calcium-vitamin D  1 tablet Oral TID   carvedilol  6.25 mg Oral BID WC   chlorhexidine  15 mL Mouth Rinse BID   Chlorhexidine Gluconate Cloth  6 each Topical Q0600   docusate sodium  100 mg Oral BID   enoxaparin (LOVENOX) injection  40 mg Subcutaneous Q24H   ipratropium-albuterol  3 mL Nebulization Q6H   loratadine  10 mg Oral Daily   mouth rinse  15 mL Mouth Rinse q12n4p   methylPREDNISolone (SOLU-MEDROL) injection  60 mg Intravenous Q12H   nystatin   Topical TID   potassium chloride  40 mEq Oral BID   sodium chloride flush  3 mL Intravenous Q12H   Continuous Infusions:  sodium chloride     calcium gluconate 2,000 mg (12/26/18 1348)    ceFAZolin (ANCEF) IV 2 g (12/26/18 1346)     LOS: 1 day   Marylu Lund, MD [facility] Pager On Amion  If 7PM-7AM, please contact night-coverage 12/26/2018, 1:52 PM

## 2018-12-26 NOTE — Progress Notes (Signed)
PHARMACY - PHYSICIAN COMMUNICATION CRITICAL VALUE ALERT - BLOOD CULTURE IDENTIFICATION (BCID)  Suzanne [name] is an 57 y.o. female who presented to [facility] on 12/25/2018 with a chief complaint of SOB  Assessment:  Patient in ICU on broad spectrum antibiotics with BCID result noted below. (include suspected source if known)  Name of physician (or Provider) Contacted: none, X. Blount Secure chat with notice of change.  Current antibiotics: Vancomycin, cefepime  Changes to prescribed antibiotics recommended:  Antibiotics changed to Cefazolin 2gm iv q8hr, per BCID algorithm protocol.     Results for orders placed or performed during the hospital encounter of 12/25/18  Blood Culture ID Panel (Reflexed) (Collected: 12/25/2018 10:09 AM)  Result Value Ref Range   Enterococcus species NOT DETECTED NOT DETECTED   Listeria monocytogenes NOT DETECTED NOT DETECTED   Staphylococcus species DETECTED (A) NOT DETECTED   Staphylococcus aureus (BCID) DETECTED (A) NOT DETECTED   Methicillin resistance NOT DETECTED NOT DETECTED   Streptococcus species NOT DETECTED NOT DETECTED   Streptococcus agalactiae NOT DETECTED NOT DETECTED   Streptococcus pneumoniae NOT DETECTED NOT DETECTED   Streptococcus pyogenes NOT DETECTED NOT DETECTED   Acinetobacter baumannii NOT DETECTED NOT DETECTED   Enterobacteriaceae species NOT DETECTED NOT DETECTED   Enterobacter cloacae complex NOT DETECTED NOT DETECTED   Escherichia coli NOT DETECTED NOT DETECTED   Klebsiella oxytoca NOT DETECTED NOT DETECTED   Klebsiella pneumoniae NOT DETECTED NOT DETECTED   Proteus species NOT DETECTED NOT DETECTED   Serratia marcescens NOT DETECTED NOT DETECTED   Haemophilus influenzae NOT DETECTED NOT DETECTED   Neisseria meningitidis NOT DETECTED NOT DETECTED   Pseudomonas aeruginosa NOT DETECTED NOT DETECTED   Candida albicans NOT DETECTED NOT DETECTED   Candida glabrata NOT DETECTED NOT DETECTED   Candida krusei NOT DETECTED NOT  DETECTED   Candida parapsilosis NOT DETECTED NOT DETECTED   Candida tropicalis NOT DETECTED NOT DETECTED    Suzanne [name] 12/26/2018  4:46 AM

## 2018-12-26 NOTE — Telephone Encounter (Signed)
Spoke with patient's daughter, [name], and let her know genetic test results are back. [name] let me know her mom is in the hospital currently. She said she would call back to get results from me once her mother is out of the hospital.

## 2018-12-26 NOTE — Progress Notes (Signed)
CRITICAL VALUE ALERT  Critical Value:  Calcium 6.1  Date & Time Notied:  12/26/18 @0406   Provider Notified: X. Blount via [employer]   Orders Received/Actions taken:

## 2018-12-26 NOTE — Progress Notes (Signed)
Suzanne [name]   DOB:1960-10-31   VO#:[id number]   EXH#:[id number]  Subjective:  Oliwia tells me she sat on "the other toilet," which is lower than hers, and couldn't get up, even with her daughter's help. They considered calling EMS to help her stand up but instead kept trying to stand, causing worsening SOB and anxiety, leading to this admission.-- Today she is tearful but says her breathing is "fine." She does not think she could stand up or 'walk very far." Denies pain, fever, cough, phlegm production or pleurisy. No family in room   Objective: morbidly obese White woman examined in bed Vitals:   12/26/18 0600 12/26/18 0741  BP: (!) 139/49 (!) 142/52  Pulse: 82 83  Resp: 18 (!) 27  Temp:    SpO2: 100% 100%    Body mass index is 34.17 kg/m.  Intake/Output Summary (Last 24 hours) at 12/26/2018 0811 Last data filed at 12/26/2018 3810 Gross per 24 hour  Intake 1108.6 ml  Output -  Net 1108.6 ml     CBG (last 3)  No results for input(s): GLUCAP in the last 72 hours.   Labs:  Lab Results  Component Value Date   WBC 6.6 12/26/2018   HGB 8.2 (L) 12/26/2018   HCT 27.8 (L) 12/26/2018   MCV 106.1 (H) 12/26/2018   PLT 226 12/26/2018   NEUTROABS 5.0 12/25/2018    _0 @  Urine Studies No results for input(s): UHGB, CRYS in the last 72 hours.  Invalid input(s): UACOL, UAPR, USPG, UPH, UTP, UGL, UKET, UBIL, UNIT, UROB, ULEU, UEPI, UWBC, URBC, UBAC, CAST, [place], [place]  Basic Metabolic Panel: Recent Labs  Lab 12/22/18 1219 12/25/18 1009 12/26/18 0235  NA 134* 136 139  K 3.5 3.6 3.2*  CL 91* 92* 96*  CO2 34* 33* 35*  GLUCOSE 154* 159* 172*  BUN _1 CREATININE 0.63 0.40* 0.44  CALCIUM 7.5* 7.0* 6.1*   GFR Estimated Creatinine Clearance: 79.4 mL/min (by C-G formula based on SCr of 0.44 mg/dL). Liver Function Tests: Recent Labs  Lab 12/22/18 1219 12/25/18 1009  AST 61* 24  ALT 51* 35  ALKPHOS 247* 235*  BILITOT 0.5 0.6  PROT 6.5 7.0  ALBUMIN 1.9*  2.3*   No results for input(s): LIPASE, AMYLASE in the last 168 hours. No results for input(s): AMMONIA in the last 168 hours. Coagulation profile No results for input(s): INR, PROTIME in the last 168 hours.  CBC: Recent Labs  Lab 12/22/18 1219 12/25/18 1009 12/26/18 0235  WBC 6.6 8.1 6.6  NEUTROABS 4.0 5.0  --   HGB 7.0* 9.6* 8.2*  HCT 22.6* 31.6* 27.8*  MCV 101.3* 104.6* 106.1*  PLT 141* 214 226   Cardiac Enzymes: No results for input(s): CKTOTAL, CKMB, CKMBINDEX, TROPONINI in the last 168 hours. BNP: Invalid input(s): POCBNP CBG: No results for input(s): GLUCAP in the last 168 hours. D-Dimer Recent Labs    12/25/18 1009  DDIMER 7.92*   Hgb A1c No results for input(s): HGBA1C in the last 72 hours. Lipid Profile Recent Labs    12/25/18 1009  TRIG 108   Thyroid function studies No results for input(s): TSH, T4TOTAL, T3FREE, THYROIDAB in the last 72 hours.  Invalid input(s): FREET3 Anemia work up Recent Labs    12/25/18 1009  FERRITIN 2,306*   Microbiology Recent Results (from the past 240 hour(s))  SARS Coronavirus 2 [facility] order, Performed in [place] hospital lab)     Status: None   Collection Time: 12/25/18 10:09  AM   Specimen: Nasopharyngeal Swab  Result Value Ref Range Status   SARS Coronavirus 2 NEGATIVE NEGATIVE Final    Comment: (NOTE) If result is NEGATIVE SARS-CoV-2 target nucleic acids are NOT DETECTED. The SARS-CoV-2 RNA is generally detectable in upper and lower  respiratory specimens during the acute phase of infection. The lowest  concentration of SARS-CoV-2 viral copies this assay can detect is 250  copies / mL. A negative result does not preclude SARS-CoV-2 infection  and should not be used as the sole basis for treatment or other  patient management decisions.  A negative result may occur with  improper specimen collection / handling, submission of specimen other  than nasopharyngeal swab, presence of viral mutation(s) within  the  areas targeted by this assay, and inadequate number of viral copies  (<250 copies / mL). A negative result must be combined with clinical  observations, patient history, and epidemiological information. If result is POSITIVE SARS-CoV-2 target nucleic acids are DETECTED. The SARS-CoV-2 RNA is generally detectable in upper and lower  respiratory specimens dur ing the acute phase of infection.  Positive  results are indicative of active infection with SARS-CoV-2.  Clinical  correlation with patient history and other diagnostic information is  necessary to determine patient infection status.  Positive results do  not rule out bacterial infection or co-infection with other viruses. If result is PRESUMPTIVE POSTIVE SARS-CoV-2 nucleic acids MAY BE PRESENT.   A presumptive positive result was obtained on the submitted specimen  and confirmed on repeat testing.  While 2019 novel coronavirus  (SARS-CoV-2) nucleic acids may be present in the submitted sample  additional confirmatory testing may be necessary for epidemiological  and / or clinical management purposes  to differentiate between  SARS-CoV-2 and other Sarbecovirus currently known to infect humans.  If clinically indicated additional testing with an alternate test  methodology [phone]) is advised. The SARS-CoV-2 RNA is generally  detectable in upper and lower respiratory sp ecimens during the acute  phase of infection. The expected result is Negative. Fact Sheet for Patients:  StrictlyIdeas.no Fact Sheet for Healthcare Providers: BankingDealers.co.za This test is not yet approved or cleared by the [place] FDA and has been authorized for detection and/or diagnosis of SARS-CoV-2 by FDA under an Emergency Use Authorization (EUA).  This EUA will remain in effect (meaning this test can be used) for the duration of the COVID-19 declaration under Section 564(b)(1) of the Act, 21  U.S.C. section 360bbb-3(b)(1), unless the authorization is terminated or revoked sooner. Performed at [facility], [place] [address], [place], [place] [address]   Blood Culture (routine x 2)     Status: None (Preliminary result)   Collection Time: 12/25/18 10:09 AM   Specimen: BLOOD  Result Value Ref Range Status   Specimen Description   Final    BLOOD RIGHT ANTECUBITAL Performed at [facility] [address], [place], [place] [address]    Special Requests   Final    BOTTLES DRAWN AEROBIC AND ANAEROBIC Blood Culture adequate volume Performed at [facility] [address], [place], [place] [address]    Culture  Setup Time   Final    GRAM POSITIVE COCCI IN CLUSTERS IN BOTH AEROBIC AND ANAEROBIC BOTTLES CRITICAL RESULT CALLED TO, READ BACK BY AND VERIFIED WITH: Audrea Muscat [id number] 12/26/2018 Mena Goes Performed at [facility], [place] [address], [place],  [address]    Culture [facility] POSITIVE COCCI  Final  Report Status PENDING  Incomplete  Blood Culture ID Panel (Reflexed)     Status: Abnormal   Collection Time: 12/25/18 10:09 AM  Result Value Ref Range Status   Enterococcus species NOT DETECTED NOT DETECTED Final   Listeria monocytogenes NOT DETECTED NOT DETECTED Final   Staphylococcus species DETECTED (A) NOT DETECTED Final    Comment: CRITICAL RESULT CALLED TO, READ BACK BY AND VERIFIED WITH: J. GRIMSLEY,PHARMD 1610 12/26/2018 T. TYSOR    Staphylococcus aureus (BCID) DETECTED (A) NOT DETECTED Final    Comment: Methicillin (oxacillin) susceptible Staphylococcus aureus (MSSA). Preferred therapy is anti staphylococcal beta lactam antibiotic (Cefazolin or Nafcillin), unless clinically contraindicated. CRITICAL RESULT CALLED TO, READ BACK BY AND VERIFIED WITH: J. Fabiola Backer [id number] 12/26/2018 T. TYSOR    Methicillin resistance NOT DETECTED NOT DETECTED Final   Streptococcus species NOT DETECTED NOT DETECTED  Final   Streptococcus agalactiae NOT DETECTED NOT DETECTED Final   Streptococcus pneumoniae NOT DETECTED NOT DETECTED Final   Streptococcus pyogenes NOT DETECTED NOT DETECTED Final   Acinetobacter baumannii NOT DETECTED NOT DETECTED Final   Enterobacteriaceae species NOT DETECTED NOT DETECTED Final   Enterobacter cloacae complex NOT DETECTED NOT DETECTED Final   Escherichia coli NOT DETECTED NOT DETECTED Final   Klebsiella oxytoca NOT DETECTED NOT DETECTED Final   Klebsiella pneumoniae NOT DETECTED NOT DETECTED Final   Proteus species NOT DETECTED NOT DETECTED Final   Serratia marcescens NOT DETECTED NOT DETECTED Final   Haemophilus influenzae NOT DETECTED NOT DETECTED Final   Neisseria meningitidis NOT DETECTED NOT DETECTED Final   Pseudomonas aeruginosa NOT DETECTED NOT DETECTED Final   Candida albicans NOT DETECTED NOT DETECTED Final   Candida glabrata NOT DETECTED NOT DETECTED Final   Candida krusei NOT DETECTED NOT DETECTED Final   Candida parapsilosis NOT DETECTED NOT DETECTED Final   Candida tropicalis NOT DETECTED NOT DETECTED Final    Comment: Performed at [facility], [address], [place], [place] [address]  Blood Culture (routine x 2)     Status: None (Preliminary result)   Collection Time: 12/25/18 10:15 AM   Specimen: BLOOD LEFT HAND  Result Value Ref Range Status   Specimen Description   Final    BLOOD LEFT HAND Performed at [facility] [address], [place], [place] [address]    Special Requests   Final    BOTTLES DRAWN AEROBIC AND ANAEROBIC Blood Culture results may not be optimal due to an inadequate volume of blood received in culture bottles Performed at [facility] [address] [place], [place] [address]    Culture  Setup Time   Final    GRAM POSITIVE COCCI IN CLUSTERS IN BOTH AEROBIC AND ANAEROBIC BOTTLES CRITICAL VALUE NOTED.  VALUE IS CONSISTENT WITH PREVIOUSLY REPORTED AND CALLED VALUE. Performed at  [facility], [place] [address] [place], [place] [address]    Culture [facility] POSITIVE COCCI  Final   Report Status PENDING  Incomplete  SARS Coronavirus 2 (CEPHEID - Performed in [place] hospital lab), Hosp Order     Status: None   Collection Time: 12/25/18  2:44 PM   Specimen: Nasopharyngeal Swab  Result Value Ref Range Status   SARS Coronavirus 2 NEGATIVE NEGATIVE Final    Comment: (NOTE) If result is NEGATIVE SARS-CoV-2 target nucleic acids are NOT DETECTED. The SARS-CoV-2 RNA is generally detectable in upper and lower  respiratory specimens during the acute phase of infection. The lowest  concentration of SARS-CoV-2 viral copies  this assay can detect is 250  copies / mL. A negative result does not preclude SARS-CoV-2 infection  and should not be used as the sole basis for treatment or other  patient management decisions.  A negative result may occur with  improper specimen collection / handling, submission of specimen other  than nasopharyngeal swab, presence of viral mutation(s) within the  areas targeted by this assay, and inadequate number of viral copies  (<250 copies / mL). A negative result must be combined with clinical  observations, patient history, and epidemiological information. If result is POSITIVE SARS-CoV-2 target nucleic acids are DETECTED. The SARS-CoV-2 RNA is generally detectable in upper and lower  respiratory specimens dur ing the acute phase of infection.  Positive  results are indicative of active infection with SARS-CoV-2.  Clinical  correlation with patient history and other diagnostic information is  necessary to determine patient infection status.  Positive results do  not rule out bacterial infection or co-infection with other viruses. If result is PRESUMPTIVE POSTIVE SARS-CoV-2 nucleic acids MAY BE PRESENT.   A presumptive positive result was obtained on the submitted specimen  and confirmed on repeat testing.  While 2019 novel coronavirus   (SARS-CoV-2) nucleic acids may be present in the submitted sample  additional confirmatory testing may be necessary for epidemiological  and / or clinical management purposes  to differentiate between  SARS-CoV-2 and other Sarbecovirus currently known to infect humans.  If clinically indicated additional testing with an alternate test  methodology [phone]) is advised. The SARS-CoV-2 RNA is generally  detectable in upper and lower respiratory sp ecimens during the acute  phase of infection. The expected result is Negative. Fact Sheet for Patients:  StrictlyIdeas.no Fact Sheet for Healthcare Providers: BankingDealers.co.za This test is not yet approved or cleared by the [place] FDA and has been authorized for detection and/or diagnosis of SARS-CoV-2 by FDA under an Emergency Use Authorization (EUA).  This EUA will remain in effect (meaning this test can be used) for the duration of the COVID-19 declaration under Section 564(b)(1) of the Act, 21 U.S.C. section 360bbb-3(b)(1), unless the authorization is terminated or revoked sooner. Performed at [facility], [place] [address], [place], [place] [address]   MRSA PCR Screening     Status: None   Collection Time: 12/25/18  5:16 PM   Specimen: Nasal Mucosa; Nasopharyngeal  Result Value Ref Range Status   MRSA by PCR NEGATIVE NEGATIVE Final    Comment:        The GeneXpert MRSA Assay (FDA approved for NASAL specimens only), is one component of a comprehensive MRSA colonization surveillance program. It is not intended to diagnose MRSA infection nor to guide or monitor treatment for MRSA infections. Performed at [facility], [place] [address] [place], [place] [address]       Studies:  Ct Angio Chest Pe W And/or Wo Contrast  Result Date: 12/25/2018 CLINICAL DATA:  Shortness of breath. History of metastatic breast cancer. EXAM: CT ANGIOGRAPHY  CHEST WITH CONTRAST TECHNIQUE: Multidetector CT imaging of the chest was performed using the standard protocol during bolus administration of intravenous contrast. Multiplanar CT image reconstructions and MIPs were obtained to evaluate the vascular anatomy. CONTRAST:  68 mL OMNIPAQUE IOHEXOL 350 MG/ML SOLN COMPARISON:  CT chest 11/17/2018. Single-view of the chest earlier today. Plain films left shoulder 12/20/2018. FINDINGS: Cardiovascular: No pulmonary embolus is identified. Bovine aortic arch is seen. Calcific aortic and coronary artery disease is noted. Heart size is upper.  No pericardial effusion. Mediastinum/Nodes: No enlarged mediastinal, hilar, or axillary lymph nodes. Thyroid gland, trachea, and esophagus demonstrate no significant findings. Small hiatal hernia noted. Lungs/Pleura: Small bilateral pleural effusions are seen. Effusion on the left has decreased in size since the prior chest CT. Right effusion is not notably changed. Opacities in both lungs in a band configuration are again seen. Scattered mild ground-glass attenuation is noted. There is basilar atelectasis, worse on the right. Upper Abdomen: Gallstones are identified as on the prior study. Visualized upper abdomen is otherwise unremarkable. Musculoskeletal: Diffuse osseous metastatic disease identified. Pathologic fracture of the left humerus is seen as on the prior plain films of the shoulder. Multiple pathologic rib fractures are also again seen. Pathologic fracture right clavicle noted. Review of the MIP images confirms the above findings. IMPRESSION: Negative for pulmonary embolus or acute disease. Small bilateral pleural effusions. Effusion on the left has decreased since the prior CT. Bandlike opacities in both lungs may be due to scar or fibrosis, unchanged. Diffuse osseous metastatic disease and multiple pathologic fractures as seen on prior studies. Gallstones. Aortic Atherosclerosis (ICD10-I70.0). Electronically Signed   By: Inge Rise M.D.   On: 12/25/2018 13:11   Dg Chest Port 1 View  Result Date: 12/25/2018 CLINICAL DATA:  58 year old female with shortness of breath. Stage IV breast cancer. EXAM: PORTABLE CHEST 1 VIEW COMPARISON:  Chest CT 11/17/2018. Chest radiograph 12/20/2018 and earlier. FINDINGS: Portable AP semi upright view at 0915 hours. Continued low lung volumes. Continued confluent bibasilar opacity, greater on the right, compatible with a combination of pleural effusion and nonspecific airspace opacity as demonstrated last month by CT. Ventilation has not significantly changed since 12/20/2018. No pneumothorax or pulmonary edema. Stable cardiac size and mediastinal contours. Diffuse osseous metastatic disease redemonstrated. Pathologic fracture of the medial right clavicle was better demonstrated by CT. IMPRESSION: 1. Ventilation appears not significantly changed from the June CT where bilateral pleural effusions and nonspecific lung base airspace opacity was noted (please see that report). 2. Diffuse osseous metastatic disease, including pathologic fracture of the right clavicle. Electronically Signed   By: Genevie Ann M.D.   On: 12/25/2018 10:13    Assessment: 58 y.o. [place] woman presenting 10/26/2018 with right-sided inflammatory breast cancer, stage IV, involving lungs, lymph nodes and bones, as follows:             (a) chest CT scan 10/26/2018 shows bilateral pleural effusions, possible lymphangitic spread of tumor, diffuse bony metastatic disease, and significant axillary mediastinal and hilar adenopathy             (b) bone scan 10/27/2018 is a "near [place]" consistent with widespread bony metastatic disease             (c) head CT with and without contrast 10/30/2018 shows no intracranial metastatic disease, multiple calvarial lesions             (d) CA-27-29 on 10/27/2018 was 1810.4  (1) pleural fluid from right thoracentesis 10/28/2018 confirms malignant cells consistent with a breast primary,  strongly estrogen and progesterone receptor positive, HER-2 not amplified, with an MIB-1 of 2%  (2) anastrozole started 10/29/2018             (a) palbociclib to start 11/13/2018             (b) denosumab/xgeva to start 11/13/2018  (3) associated problems:             (a) hypoxia secondary to effusions, deconditioning, morbid obesity             (  b) pain from bone lesions             (c) right upper extremity lymphedema             (d) anemia, requiring transfusion             (e) poor venous access  (f) poor social situation  (4) genetics testing pending     Plan:  From a breast cancer point of view, Haylie is better. Her CT yesterday shows significantly reduced adenopathy, improved effusions. Her tumor marker is also favorable  Results for MARGUITA, VENNING (MRN [id number]) as of 12/26/2018 08:12  Ref. Range 10/27/2018 08:59 11/07/2018 12:47 12/09/2018 12:36 12/16/2018 10:19 12/22/2018 12:19  CA 27.29 Latest Ref Range: 0.0 - 38.6 U/mL 1,810.4 (H) 978.7 (H) 300.5 (H) 196.1 (H) 163.4 (H)   Also, she is tolerating her treatment (anastrozole,palbociclib, Xgeva) without unusual side effects.  She remains very motivated. We are supporting her as much as we can with frequent visits, transfusions, etc. Nevertheless she had to be readmitted because basically she cannot manage ADLs. The patient's daughter (with a history of cerebral palsy) is also motivated and helpful but was not able to accomplish something as simple as helping her mother rise from the toilet.  I think Merlina needs SNF/Rehab placement. She is a greeable if we can manage it. We have been working on getting her Medicaid--I do not know where that stands at present, will ask SW to sort out.  If patient goes home we will continue to support intensely with weekly visits, but I suspect she will be readmitted in the near future with similar complaints.  Will follow with you.  Chauncey Cruel, MD 12/26/2018  8:11 AM Medical  Oncology and Hematology [facility] [address] [place], [place] [address] Tel. [phone]    Fax. [phone]

## 2018-12-26 NOTE — Progress Notes (Signed)
Lower extremity venous has been completed.   Preliminary results in CV Proc.   Abram Sander 12/26/2018 9:15 AM

## 2018-12-27 LAB — CBC
HCT: 29.6 % — ABNORMAL LOW (ref 36.0–46.0)
Hemoglobin: 8.8 g/dL — ABNORMAL LOW (ref 12.0–15.0)
MCH: 31.8 pg (ref 26.0–34.0)
MCHC: 29.7 g/dL — ABNORMAL LOW (ref 30.0–36.0)
MCV: 106.9 fL — ABNORMAL HIGH (ref 80.0–100.0)
Platelets: 282 10*3/uL (ref 150–400)
RBC: 2.77 MIL/uL — ABNORMAL LOW (ref 3.87–5.11)
RDW: 19.9 % — ABNORMAL HIGH (ref 11.5–15.5)
WBC: 8.1 10*3/uL (ref 4.0–10.5)
nRBC: 3.2 % — ABNORMAL HIGH (ref 0.0–0.2)

## 2018-12-27 LAB — BASIC METABOLIC PANEL
Anion gap: 9 (ref 5–15)
BUN: 17 mg/dL (ref 6–20)
CO2: 34 mmol/L — ABNORMAL HIGH (ref 22–32)
Calcium: 7 mg/dL — ABNORMAL LOW (ref 8.9–10.3)
Chloride: 96 mmol/L — ABNORMAL LOW (ref 98–111)
Creatinine, Ser: 0.46 mg/dL (ref 0.44–1.00)
GFR calc Af Amer: >60 mL/min (ref >60)
GFR calc non Af Amer: >60 mL/min (ref >60)
Glucose, Bld: 173 mg/dL — ABNORMAL HIGH (ref 70–99)
Potassium: 4.1 mmol/L (ref 3.5–5.1)
Sodium: 139 mmol/L (ref 135–145)

## 2018-12-27 NOTE — Progress Notes (Signed)
PROGRESS NOTE    Suzanne [name]  DPO:[id number] DOB: 1960/10/31 DOA: 12/25/2018 PCP: Patient, No Pcp Per    Brief Narrative:  58 y.o. 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  Patient was sitting bedside commode and she was not able to stand up this morning and she developed worsening shortness of breath.  She denies chest pain, cough, sore throat.  Her thought that patient has been tired because of multiple appointments. Patient appears mildly confused.  She is still having some shortness of breath.  She denies chest pain, abdominal pain.  Evaluation in the ED; patient was noted to be febrile temperature 100, tachycardic.  Labs sodium 136, potassium 3.6, CO2 33, BUN 12 calcium 7.0, alkaline phosphatase 235, albumin 2.3, LDH 244, ferritin 2306, CRP 19, procalcitonin less than 0.10, d-dimer 7.9, fibrinogen more than 800, white blood cell 8.1, hemoglobin 9.6, platelets 214.  BNP 89.  X-ray:Ventilation appears not significantly changed from the June CT where bilateral pleural effusions and nonspecific lung base airspace opacity was noted (please see that report). Diffuse osseous metastatic disease, including pathologic fracture of the right clavicle.  CT angio; Negative for pulmonary embolus or acute disease. Small bilateral pleural effusions. Effusion on the left has decreased since the prior CT. Bandlike opacities in both lungs may be due to scar or fibrosis, unchanged.     Assessment & Plan:   Active Problems:   Bone metastasis (HCC)   Pleural effusion, bilateral   Carcinoma of breast,  estrogen receptor positive, stage 4, right (HCC)   Acute on chronic respiratory failure with hypoxia (HCC)   Pneumonia   Respiratory failure, acute ([place])  1-Acute on chronic hypoxic respiratory failure: Probably related to pneumonia. Patient present with fever, worsening hypoxemia.  CT chest was negative for PE though showed chronic fibrosis.  Unable to rule out superimposed infection on CT.  Bilateral pleural effusion appears to be a small. -SARS coronavirus test neg x 2 -On scheduled albuterol. -Now on IV Solu-Medrol -Weaned off bipap, remains comfortable on minimal O2 support -Blood cx pos for MSSA. See below  2-Stage IV metastatic breast cancer to bone and lung, pleural effusion; Patient currently undergoing radiation.  She has a pathological proximal left humeral diaphysis fracture; pain management for now.  She is supposed to follow with Ortho as an outpatient. -Seen earlier by Oncology  3-Anemia: of Malignancy Patient reports that she received a blood transfusion the day prior to admission. -Hemodynamically stable at present -hgb stable, labs reviewed  4-elevated d-dimer:  CT angios negative for PE. LE dopplers reviewed, neg for DVT Stable currently. Possible elevated d-dimer related to malignancy  5-elevated alkaline phosphatase: Likely related to metastatic bone disease. -remains stable at present  6-Chronic diastolic Heart failure;  Given dose of IV lasix at time of presentation Of note BNP normal on presentation Clinically stable  7-MSSA bacteremia -Currently afebrile -Remains with no leukocytosis -Reviewed 2d echo, unremarkable -ID recs noted. Repeat blood cx thus far neg x2. Per ID if neg repeat cx and if no evidence of endocarditis, then anticipate 2 weeks of tx  8-Sacral ulceration on presentation -Noted at time of presentation -Have consulted [facility]  DVT prophylaxis: Lovenox subQ Code Status: Full Family Communication: Pt in room, family not at  bedside Disposition Plan: Uncertain at this time  Consultants:   Oncology  ID  Procedures:   LE dopplers 7/31, neg for dvt  2d echo 7/31  Antimicrobials: Anti-infectives (From admission, onward)   Start     Dose/Rate Route Frequency Ordered Stop   12/26/18 0600  ceFAZolin (ANCEF) IVPB 2g/100 mL premix     2 g 200 mL/hr over 30 Minutes Intravenous Every 8 hours 12/26/18 0445     12/25/18 2300  vancomycin (VANCOCIN) IVPB 1000 mg/200 mL premix  Status:  Discontinued     1,000 mg 200 mL/hr over 60 Minutes Intravenous Every 12 hours 12/25/18 1101 12/26/18 0444   12/25/18 2200  ceFEPIme (MAXIPIME) 2 g in sodium chloride 0.9 % 100 mL IVPB  Status:  Discontinued     2 g 200 mL/hr over 30 Minutes Intravenous Every 8 hours 12/25/18 1447 12/26/18 0444   12/25/18 1515  ceFEPIme (MAXIPIME) 1 g in sodium chloride 0.9 % 100 mL IVPB     1 g 200 mL/hr over 30 Minutes Intravenous  Once 12/25/18 1446 12/25/18 1728   12/25/18 1115  vancomycin (VANCOCIN) 2,000 mg in sodium chloride 0.9 % 500 mL IVPB     2,000 mg 250 mL/hr over 120 Minutes Intravenous  Once 12/25/18 1101 12/25/18 1405   12/25/18 1045  ceFEPIme (MAXIPIME) 1 g in sodium chloride 0.9 % 100 mL IVPB     1 g 200 mL/hr over 30 Minutes Intravenous  Once 12/25/18 1022 12/25/18 1207      Subjective: Without complaints. Feels weak  Objective: Vitals:   12/27/18 0704 12/27/18 0739 12/27/18 1302 12/27/18 1314  BP: 134/67  126/73   Pulse: 96  (!) 102   Resp: 20  16   Temp: 97.8 F (36.6 C)  98.5 F (36.9 C)   TempSrc: Oral  Oral   SpO2: 100% 96% 97% 95%  Weight:      Height:        Intake/Output Summary (Last 24 hours) at 12/27/2018 1713 Last data filed at 12/27/2018 0100 Gross per 24 hour  Intake 100 ml  Output --  Net 100 ml   Filed Weights   12/25/18 0947 12/26/18 0500 12/27/18 0316  Weight: 127 kg 87.5 kg 90.4 kg    Examination: General exam: Awake, laying in bed, in nad Respiratory system: Normal respiratory  effort, no wheezing Cardiovascular system: regular rate, s1, s2 Gastrointestinal system: Soft, nondistended, positive BS Central nervous system: CN2-12 grossly intact, strength intact Extremities: Perfused, no clubbing Skin: Normal skin turgor, no notable skin lesions seen Psychiatry: Mood normal // no visual hallucinations    Data Reviewed: I have personally reviewed following labs and imaging studies  CBC: Recent Labs  Lab 12/22/18 1219 12/25/18 1009 12/26/18 0235 12/27/18 0213  WBC 6.6 8.1 6.6 8.1  NEUTROABS 4.0 5.0  --   --   HGB 7.0* 9.6* 8.2* 8.8*  HCT 22.6* 31.6* 27.8* 29.6*  MCV 101.3* 104.6* 106.1* 106.9*  PLT 141* 214 226 258   Basic Metabolic Panel: Recent Labs  Lab 12/22/18 1219 12/25/18 1009 12/26/18 0235 12/27/18 0213  NA 134* 136 139 139  K 3.5 3.6 3.2* 4.1  CL 91* 92* 96* 96*  CO2 34* 33* 35* 34*  GLUCOSE 154* 159* 172* 173*  BUN 15 12 13 17   CREATININE 0.63 0.40* 0.44 0.46  CALCIUM 7.5* 7.0* 6.1* 7.0*   GFR: Estimated Creatinine  Clearance: 80.8 mL/min (by C-G formula based on SCr of 0.46 mg/dL). Liver Function Tests: Recent Labs  Lab 12/22/18 1219 12/25/18 1009  AST 61* 24  ALT 51* 35  ALKPHOS 247* 235*  BILITOT 0.5 0.6  PROT 6.5 7.0  ALBUMIN 1.9* 2.3*   No results for input(s): LIPASE, AMYLASE in the last 168 hours. No results for input(s): AMMONIA in the last 168 hours. Coagulation Profile: No results for input(s): INR, PROTIME in the last 168 hours. Cardiac Enzymes: No results for input(s): CKTOTAL, CKMB, CKMBINDEX, TROPONINI in the last 168 hours. BNP (last 3 results) No results for input(s): PROBNP in the last 8760 hours. HbA1C: No results for input(s): HGBA1C in the last 72 hours. CBG: No results for input(s): GLUCAP in the last 168 hours. Lipid Profile: Recent Labs    12/25/18 1009  TRIG 108   Thyroid Function Tests: No results for input(s): TSH, T4TOTAL, FREET4, T3FREE, THYROIDAB in the last 72 hours. Anemia  Panel: Recent Labs    12/25/18 1009  FERRITIN 2,306*   Sepsis Labs: Recent Labs  Lab 12/25/18 1009  PROCALCITON <0.10  LATICACIDVEN 1.0    Recent Results (from the past 240 hour(s))  SARS Coronavirus 2 [facility] order, Performed in [place] hospital lab)     Status: None   Collection Time: 12/25/18 10:09 AM   Specimen: Nasopharyngeal Swab  Result Value Ref Range Status   SARS Coronavirus 2 NEGATIVE NEGATIVE Final    Comment: (NOTE) If result is NEGATIVE SARS-CoV-2 target nucleic acids are NOT DETECTED. The SARS-CoV-2 RNA is generally detectable in upper and lower  respiratory specimens during the acute phase of infection. The lowest  concentration of SARS-CoV-2 viral copies this assay can detect is 250  copies / mL. A negative result does not preclude SARS-CoV-2 infection  and should not be used as the sole basis for treatment or other  patient management decisions.  A negative result may occur with  improper specimen collection / handling, submission of specimen other  than nasopharyngeal swab, presence of viral mutation(s) within the  areas targeted by this assay, and inadequate number of viral copies  (<250 copies / mL). A negative result must be combined with clinical  observations, patient history, and epidemiological information. If result is POSITIVE SARS-CoV-2 target nucleic acids are DETECTED. The SARS-CoV-2 RNA is generally detectable in upper and lower  respiratory specimens dur ing the acute phase of infection.  Positive  results are indicative of active infection with SARS-CoV-2.  Clinical  correlation with patient history and other diagnostic information is  necessary to determine patient infection status.  Positive results do  not rule out bacterial infection or co-infection with other viruses. If result is PRESUMPTIVE POSTIVE SARS-CoV-2 nucleic acids MAY BE PRESENT.   A presumptive positive result was obtained on the submitted specimen  and confirmed  on repeat testing.  While 2019 novel coronavirus  (SARS-CoV-2) nucleic acids may be present in the submitted sample  additional confirmatory testing may be necessary for epidemiological  and / or clinical management purposes  to differentiate between  SARS-CoV-2 and other Sarbecovirus currently known to infect humans.  If clinically indicated additional testing with an alternate test  methodology [phone]) is advised. The SARS-CoV-2 RNA is generally  detectable in upper and lower respiratory sp ecimens during the acute  phase of infection. The expected result is Negative. Fact Sheet for Patients:  StrictlyIdeas.no Fact Sheet for Healthcare Providers: BankingDealers.co.za This test is not yet approved or cleared by the  [place] Architectural technologist and has been authorized for detection and/or diagnosis of SARS-CoV-2 by FDA under an Print production planner (EUA).  This EUA will remain in effect (meaning this test can be used) for the duration of the COVID-19 declaration under Section 564(b)(1) of the Act, 21 U.S.C. section 360bbb-3(b)(1), unless the authorization is terminated or revoked sooner. Performed at [facility], [place] [address] [place], [place] [address]   Blood Culture (routine x 2)     Status: Abnormal (Preliminary result)   Collection Time: 12/25/18 10:09 AM   Specimen: BLOOD  Result Value Ref Range Status   Specimen Description   Final    BLOOD RIGHT ANTECUBITAL Performed at [facility] [place] [address], [place], [place] [address]    Special Requests   Final    BOTTLES DRAWN AEROBIC AND ANAEROBIC Blood Culture adequate volume Performed at [facility] [address], [place], [place] [address]    Culture  Setup Time   Final    GRAM POSITIVE COCCI IN CLUSTERS IN BOTH AEROBIC AND ANAEROBIC BOTTLES CRITICAL RESULT CALLED TO, READ BACK BY AND VERIFIED WITH: Audrea Muscat  [id number] 12/26/2018 T. TYSOR    Culture (A)  Final    STAPHYLOCOCCUS AUREUS SUSCEPTIBILITIES TO FOLLOW Performed at [facility], [place] [address], [place], [place] [address]    Report Status PENDING  Incomplete  Blood Culture ID Panel (Reflexed)     Status: Abnormal   Collection Time: 12/25/18 10:09 AM  Result Value Ref Range Status   Enterococcus species NOT DETECTED NOT DETECTED Final   Listeria monocytogenes NOT DETECTED NOT DETECTED Final   Staphylococcus species DETECTED (A) NOT DETECTED Final    Comment: CRITICAL RESULT CALLED TO, READ BACK BY AND VERIFIED WITH: J. Fabiola Backer [id number] 12/26/2018 T. TYSOR    Staphylococcus aureus (BCID) DETECTED (A) NOT DETECTED Final    Comment: Methicillin (oxacillin) susceptible Staphylococcus aureus (MSSA). Preferred therapy is anti staphylococcal beta lactam antibiotic (Cefazolin or Nafcillin), unless clinically contraindicated. CRITICAL RESULT CALLED TO, READ BACK BY AND VERIFIED WITH: J. Fabiola Backer [id number] 12/26/2018 T. TYSOR    Methicillin resistance NOT DETECTED NOT DETECTED Final   Streptococcus species NOT DETECTED NOT DETECTED Final   Streptococcus agalactiae NOT DETECTED NOT DETECTED Final   Streptococcus pneumoniae NOT DETECTED NOT DETECTED Final   Streptococcus pyogenes NOT DETECTED NOT DETECTED Final   Acinetobacter baumannii NOT DETECTED NOT DETECTED Final   Enterobacteriaceae species NOT DETECTED NOT DETECTED Final   Enterobacter cloacae complex NOT DETECTED NOT DETECTED Final   Escherichia coli NOT DETECTED NOT DETECTED Final   Klebsiella oxytoca NOT DETECTED NOT DETECTED Final   Klebsiella pneumoniae NOT DETECTED NOT DETECTED Final   Proteus species NOT DETECTED NOT DETECTED Final   Serratia marcescens NOT DETECTED NOT DETECTED Final   Haemophilus influenzae NOT DETECTED NOT DETECTED Final   Neisseria meningitidis NOT DETECTED NOT DETECTED Final   Pseudomonas aeruginosa NOT DETECTED NOT DETECTED Final   Candida  albicans NOT DETECTED NOT DETECTED Final   Candida glabrata NOT DETECTED NOT DETECTED Final   Candida krusei NOT DETECTED NOT DETECTED Final   Candida parapsilosis NOT DETECTED NOT DETECTED Final   Candida tropicalis NOT DETECTED NOT DETECTED Final    Comment: Performed at [facility], [address], [place], [place] [address]  Blood Culture (routine x 2)     Status: Abnormal (Preliminary result)   Collection Time: 12/25/18 10:15 AM   Specimen: BLOOD LEFT HAND  Result Value Ref Range Status   Specimen Description   Final    BLOOD LEFT HAND Performed at [facility] [address], [place], [place] [address]    Special Requests   Final    BOTTLES DRAWN AEROBIC AND ANAEROBIC Blood Culture results may not be optimal due to an inadequate volume of blood received in culture bottles Performed at [facility] [address] [place], [place] [address]    Culture  Setup Time   Final    GRAM POSITIVE COCCI IN CLUSTERS IN BOTH AEROBIC AND ANAEROBIC BOTTLES CRITICAL VALUE NOTED.  VALUE IS CONSISTENT WITH PREVIOUSLY REPORTED AND CALLED VALUE. Performed at [facility], [place] [address] [place], [place] [address]    Culture STAPHYLOCOCCUS AUREUS (A)  Final   Report Status PENDING  Incomplete  SARS Coronavirus 2 (CEPHEID - Performed in [place] hospital lab), Hosp Order     Status: None   Collection Time: 12/25/18  2:44 PM   Specimen: Nasopharyngeal Swab  Result Value Ref Range Status   SARS Coronavirus 2 NEGATIVE NEGATIVE Final    Comment: (NOTE) If result is NEGATIVE SARS-CoV-2 target nucleic acids are NOT DETECTED. The SARS-CoV-2 RNA is generally detectable in upper and lower  respiratory specimens during the acute phase of infection. The lowest  concentration of SARS-CoV-2 viral copies this assay can detect is 250  copies / mL. A negative result does not preclude SARS-CoV-2 infection  and should not be used as the sole basis for  treatment or other  patient management decisions.  A negative result may occur with  improper specimen collection / handling, submission of specimen other  than nasopharyngeal swab, presence of viral mutation(s) within the  areas targeted by this assay, and inadequate number of viral copies  (<250 copies / mL). A negative result must be combined with clinical  observations, patient history, and epidemiological information. If result is POSITIVE SARS-CoV-2 target nucleic acids are DETECTED. The SARS-CoV-2 RNA is generally detectable in upper and lower  respiratory specimens dur ing the acute phase of infection.  Positive  results are indicative of active infection with SARS-CoV-2.  Clinical  correlation with patient history and other diagnostic information is  necessary to determine patient infection status.  Positive results do  not rule out bacterial infection or co-infection with other viruses. If result is PRESUMPTIVE POSTIVE SARS-CoV-2 nucleic acids MAY BE PRESENT.   A presumptive positive result was obtained on the submitted specimen  and confirmed on repeat testing.  While 2019 novel coronavirus  (SARS-CoV-2) nucleic acids may be present in the submitted sample  additional confirmatory testing may be necessary for epidemiological  and / or clinical management purposes  to differentiate between  SARS-CoV-2 and other Sarbecovirus currently known to infect humans.  If clinically indicated additional testing with an alternate test  methodology [phone]) is advised. The SARS-CoV-2 RNA is generally  detectable in upper and lower respiratory sp ecimens during the acute  phase of infection. The expected result is Negative. Fact Sheet for Patients:  StrictlyIdeas.no Fact Sheet for Healthcare Providers: BankingDealers.co.za This test is not yet approved or cleared by the [place] FDA and has been authorized for detection and/or  diagnosis of SARS-CoV-2 by FDA under an Emergency Use Authorization (EUA).  This EUA will remain in effect (meaning this test can be used) for the duration of the COVID-19 declaration under Section 564(b)(1) of the Act, 21 U.S.C. section 360bbb-3(b)(1), unless the authorization is terminated  or revoked sooner. Performed at [facility], [place] [address] [place], [place] [address]   MRSA PCR Screening     Status: None   Collection Time: 12/25/18  5:16 PM   Specimen: Nasal Mucosa; Nasopharyngeal  Result Value Ref Range Status   MRSA by PCR NEGATIVE NEGATIVE Final    Comment:        The GeneXpert MRSA Assay (FDA approved for NASAL specimens only), is one component of a comprehensive MRSA colonization surveillance program. It is not intended to diagnose MRSA infection nor to guide or monitor treatment for MRSA infections. Performed at [facility], [place] [address], [place], [place] [address]   Culture, blood (routine x 2)     Status: None (Preliminary result)   Collection Time: 12/26/18  5:36 PM   Specimen: BLOOD  Result Value Ref Range Status   Specimen Description   Final    BLOOD RIGHT ANTECUBITAL Performed at [facility] [address], [place], [place] [address]    Special Requests   Final    BOTTLES DRAWN AEROBIC ONLY Blood Culture adequate volume Performed at [facility] [address] [place], [place] [address]    Culture   Final    NO GROWTH < 12 HOURS Performed at [facility] [address] [place], [place] [address]    Report Status PENDING  Incomplete  Culture, blood (routine x 2)     Status: None (Preliminary result)   Collection Time: 12/26/18  5:36 PM   Specimen: BLOOD RIGHT HAND  Result Value Ref Range Status   Specimen Description   Final    BLOOD RIGHT HAND Performed at [facility] [address] [place], [place] [address]    Special Requests    Final    BOTTLES DRAWN AEROBIC ONLY Blood Culture adequate volume Performed at [place] [address], [place], [place] [address]    Culture   Final    NO GROWTH < 12 HOURS Performed at [facility] [address] [place],  [address]    Report Status PENDING  Incomplete     Radiology Studies: Vas [place] Lower Extremity Venous (dvt)  Result Date: 12/26/2018  Lower Venous Study Indications: Edema.  Comparison Study: previous study done 11/17/18 Performing Technologist: Abram Sander RVS  Examination Guidelines: A complete evaluation includes B-mode imaging, spectral Doppler, color Doppler, and power Doppler as needed of all accessible portions of each vessel. Bilateral testing is considered an integral part of a complete examination. Limited examinations for reoccurring indications may be performed as noted.  +---------+---------------+---------+-----------+----------+--------------+  RIGHT     Compressibility Phasicity Spontaneity Properties Summary         +---------+---------------+---------+-----------+----------+--------------+  CFV       Full            Yes       Yes                                    +---------+---------------+---------+-----------+----------+--------------+  SFJ       Full                                                             +---------+---------------+---------+-----------+----------+--------------+  FV Prox   Full                                                             +---------+---------------+---------+-----------+----------+--------------+  FV Mid    Full                                                             +---------+---------------+---------+-----------+----------+--------------+  FV Distal Full                                                             +---------+---------------+---------+-----------+----------+--------------+  PFV       Full                                                              +---------+---------------+---------+-----------+----------+--------------+  POP       Full            Yes       Yes                                    +---------+---------------+---------+-----------+----------+--------------+  PTV       Full                                                             +---------+---------------+---------+-----------+----------+--------------+  PERO                                                       Not visualized  +---------+---------------+---------+-----------+----------+--------------+   +---------+---------------+---------+-----------+----------+--------------+  LEFT      Compressibility Phasicity Spontaneity Properties Summary         +---------+---------------+---------+-----------+----------+--------------+  CFV       Full            Yes       Yes                                    +---------+---------------+---------+-----------+----------+--------------+  SFJ       Full                                                             +---------+---------------+---------+-----------+----------+--------------+  FV Prox   Full                                                             +---------+---------------+---------+-----------+----------+--------------+  FV Mid    Full                                                             +---------+---------------+---------+-----------+----------+--------------+  FV Distal Full                                                             +---------+---------------+---------+-----------+----------+--------------+  PFV       Full                                                             +---------+---------------+---------+-----------+----------+--------------+  POP       Full            Yes       Yes                                    +---------+---------------+---------+-----------+----------+--------------+  PTV       Full                                                              +---------+---------------+---------+-----------+----------+--------------+  PERO                                                       Not visualized  +---------+---------------+---------+-----------+----------+--------------+     Summary: Right: There is no evidence of deep vein thrombosis in the lower extremity. Left: No evidence of deep vein thrombosis in the lower extremity.  *See table(s) above for measurements and observations. Electronically signed by Curt Jews MD on 12/26/2018 at 1:51:48 PM.    Final     Scheduled Meds:  anastrozole  1 mg Oral Daily   calcium-vitamin D  1 tablet Oral TID   carvedilol  6.25 mg Oral BID WC   chlorhexidine  15 mL Mouth Rinse BID   Chlorhexidine Gluconate Cloth  6 each Topical Q0600   docusate sodium  100 mg Oral BID   enoxaparin (LOVENOX) injection  40 mg Subcutaneous Q24H   ipratropium-albuterol  3 mL Nebulization TID   loratadine  10 mg Oral  Daily   mouth rinse  15 mL Mouth Rinse q12n4p   methylPREDNISolone (SOLU-MEDROL) injection  60 mg Intravenous Q12H   nystatin   Topical TID   sodium chloride flush  3 mL Intravenous Q12H   Continuous Infusions:  sodium chloride      ceFAZolin (ANCEF) IV 2 g (12/27/18 1319)     LOS: 2 days   [place] Lund, MD [facility] Pager On Amion  If 7PM-7AM, please contact night-coverage 12/27/2018, 5:13 PM

## 2018-12-27 NOTE — Progress Notes (Addendum)
INFECTIOUS DISEASE PROGRESS NOTE  ID: Suzanne [name] is a 58 y.o. female with  Active Problems:   Bone metastasis ([place])   Pleural effusion, bilateral   Carcinoma of breast, estrogen receptor positive, stage 4, right (HCC)   Acute on chronic respiratory failure with hypoxia (HCC)   Pneumonia   Respiratory failure, acute (HCC)  Subjective: C/o L arm pain.   Abtx:  Anti-infectives (From admission, onward)   Start     Dose/Rate Route Frequency Ordered Stop   12/26/18 0600  ceFAZolin (ANCEF) IVPB 2g/100 mL premix     2 g 200 mL/hr over 30 Minutes Intravenous Every 8 hours 12/26/18 0445     12/25/18 2300  vancomycin (VANCOCIN) IVPB 1000 mg/200 mL premix  Status:  Discontinued     1,000 mg 200 mL/hr over 60 Minutes Intravenous Every 12 hours 12/25/18 1101 12/26/18 0444   12/25/18 2200  ceFEPIme (MAXIPIME) 2 g in sodium chloride 0.9 % 100 mL IVPB  Status:  Discontinued     2 g 200 mL/hr over 30 Minutes Intravenous Every 8 hours 12/25/18 1447 12/26/18 0444   12/25/18 1515  ceFEPIme (MAXIPIME) 1 g in sodium chloride 0.9 % 100 mL IVPB     1 g 200 mL/hr over 30 Minutes Intravenous  Once 12/25/18 1446 12/25/18 1728   12/25/18 1115  vancomycin (VANCOCIN) 2,000 mg in sodium chloride 0.9 % 500 mL IVPB     2,000 mg 250 mL/hr over 120 Minutes Intravenous  Once 12/25/18 1101 12/25/18 1405   12/25/18 1045  ceFEPIme (MAXIPIME) 1 g in sodium chloride 0.9 % 100 mL IVPB     1 g 200 mL/hr over 30 Minutes Intravenous  Once 12/25/18 1022 12/25/18 1207      Medications:  Scheduled:  anastrozole  1 mg Oral Daily   calcium-vitamin D  1 tablet Oral TID   carvedilol  6.25 mg Oral BID WC   chlorhexidine  15 mL Mouth Rinse BID   Chlorhexidine Gluconate Cloth  6 each Topical Q0600   docusate sodium  100 mg Oral BID   enoxaparin (LOVENOX) injection  40 mg Subcutaneous Q24H   ipratropium-albuterol  3 mL Nebulization TID   loratadine  10 mg Oral Daily   mouth rinse  15 mL Mouth Rinse q12n4p    methylPREDNISolone (SOLU-MEDROL) injection  60 mg Intravenous Q12H   nystatin   Topical TID   sodium chloride flush  3 mL Intravenous Q12H    Objective: Vital signs in last 24 hours: Temp:  [97.6 F (36.4 C)-98.5 F (36.9 C)] 98.5 F (36.9 C) (08/01 1302) Pulse Rate:  [80-102] 102 (08/01 1302) Resp:  [16-26] 16 (08/01 1302) BP: (126-158)/(59-73) 126/73 (08/01 1302) SpO2:  [94 %-100 %] 95 % (08/01 1314) Weight:  [90.4 kg] 90.4 kg (08/01 0316)   General appearance: alert, cooperative and no distress Resp: clear to auscultation bilaterally Cardio: regular rate and rhythm GI: normal findings: bowel sounds normal and soft, non-tender  Lab Results Recent Labs    12/26/18 0235 12/27/18 0213  WBC 6.6 8.1  HGB 8.2* 8.8*  HCT 27.8* 29.6*  NA 139 139  K 3.2* 4.1  CL 96* 96*  CO2 35* 34*  BUN 13 17  CREATININE 0.44 0.46   Liver Panel Recent Labs    12/25/18 1009  PROT 7.0  ALBUMIN 2.3*  AST 24  ALT 35  ALKPHOS 235*  BILITOT 0.6   Sedimentation Rate No results for input(s): ESRSEDRATE in the last 72 hours.  C-Reactive Protein Recent Labs    12/25/18 1009  CRP 19.6*    Microbiology: Recent Results (from the past 240 hour(s))  SARS Coronavirus 2 [facility] order, Performed in [place] hospital lab)     Status: None   Collection Time: 12/25/18 10:09 AM   Specimen: Nasopharyngeal Swab  Result Value Ref Range Status   SARS Coronavirus 2 NEGATIVE NEGATIVE Final    Comment: (NOTE) If result is NEGATIVE SARS-CoV-2 target nucleic acids are NOT DETECTED. The SARS-CoV-2 RNA is generally detectable in upper and lower  respiratory specimens during the acute phase of infection. The lowest  concentration of SARS-CoV-2 viral copies this assay can detect is 250  copies / mL. A negative result does not preclude SARS-CoV-2 infection  and should not be used as the sole basis for treatment or other  patient management decisions.  A negative result may occur with    improper specimen collection / handling, submission of specimen other  than nasopharyngeal swab, presence of viral mutation(s) within the  areas targeted by this assay, and inadequate number of viral copies  (<250 copies / mL). A negative result must be combined with clinical  observations, patient history, and epidemiological information. If result is POSITIVE SARS-CoV-2 target nucleic acids are DETECTED. The SARS-CoV-2 RNA is generally detectable in upper and lower  respiratory specimens dur ing the acute phase of infection.  Positive  results are indicative of active infection with SARS-CoV-2.  Clinical  correlation with patient history and other diagnostic information is  necessary to determine patient infection status.  Positive results do  not rule out bacterial infection or co-infection with other viruses. If result is PRESUMPTIVE POSTIVE SARS-CoV-2 nucleic acids MAY BE PRESENT.   A presumptive positive result was obtained on the submitted specimen  and confirmed on repeat testing.  While 2019 novel coronavirus  (SARS-CoV-2) nucleic acids may be present in the submitted sample  additional confirmatory testing may be necessary for epidemiological  and / or clinical management purposes  to differentiate between  SARS-CoV-2 and other Sarbecovirus currently known to infect humans.  If clinically indicated additional testing with an alternate test  methodology [phone]) is advised. The SARS-CoV-2 RNA is generally  detectable in upper and lower respiratory sp ecimens during the acute  phase of infection. The expected result is Negative. Fact Sheet for Patients:  StrictlyIdeas.no Fact Sheet for Healthcare Providers: BankingDealers.co.za This test is not yet approved or cleared by the [place] FDA and has been authorized for detection and/or diagnosis of SARS-CoV-2 by FDA under an Emergency Use Authorization (EUA).  This EUA will  remain in effect (meaning this test can be used) for the duration of the COVID-19 declaration under Section 564(b)(1) of the Act, 21 U.S.C. section 360bbb-3(b)(1), unless the authorization is terminated or revoked sooner. Performed at [facility], [place] [address] [place], [place] [address]   Blood Culture (routine x 2)     Status: Abnormal (Preliminary result)   Collection Time: 12/25/18 10:09 AM   Specimen: BLOOD  Result Value Ref Range Status   Specimen Description   Final    BLOOD RIGHT ANTECUBITAL Performed at [facility] [address] [place], [place] [address]    Special Requests   Final    BOTTLES DRAWN AEROBIC AND ANAEROBIC Blood Culture adequate volume Performed at [facility] [address] [place], [place] [address]    Culture  Setup Time   Final    GRAM POSITIVE COCCI  IN CLUSTERS IN BOTH AEROBIC AND ANAEROBIC BOTTLES CRITICAL RESULT CALLED TO, READ BACK BY AND VERIFIED WITH: Audrea Muscat [id number] 12/26/2018 T. TYSOR    Culture (A)  Final    STAPHYLOCOCCUS AUREUS SUSCEPTIBILITIES TO FOLLOW Performed at [facility], [place] [address] [place], [place] [address]    Report Status PENDING  Incomplete  Blood Culture ID Panel (Reflexed)     Status: Abnormal   Collection Time: 12/25/18 10:09 AM  Result Value Ref Range Status   Enterococcus species NOT DETECTED NOT DETECTED Final   Listeria monocytogenes NOT DETECTED NOT DETECTED Final   Staphylococcus species DETECTED (A) NOT DETECTED Final    Comment: CRITICAL RESULT CALLED TO, READ BACK BY AND VERIFIED WITH: J. Fabiola Backer [id number] 12/26/2018 T. TYSOR    Staphylococcus aureus (BCID) DETECTED (A) NOT DETECTED Final    Comment: Methicillin (oxacillin) susceptible Staphylococcus aureus (MSSA). Preferred therapy is anti staphylococcal beta lactam antibiotic (Cefazolin or Nafcillin), unless clinically contraindicated. CRITICAL RESULT CALLED TO, READ BACK  BY AND VERIFIED WITH: J. Fabiola Backer [id number] 12/26/2018 T. TYSOR    Methicillin resistance NOT DETECTED NOT DETECTED Final   Streptococcus species NOT DETECTED NOT DETECTED Final   Streptococcus agalactiae NOT DETECTED NOT DETECTED Final   Streptococcus pneumoniae NOT DETECTED NOT DETECTED Final   Streptococcus pyogenes NOT DETECTED NOT DETECTED Final   Acinetobacter baumannii NOT DETECTED NOT DETECTED Final   Enterobacteriaceae species NOT DETECTED NOT DETECTED Final   Enterobacter cloacae complex NOT DETECTED NOT DETECTED Final   Escherichia coli NOT DETECTED NOT DETECTED Final   Klebsiella oxytoca NOT DETECTED NOT DETECTED Final   Klebsiella pneumoniae NOT DETECTED NOT DETECTED Final   Proteus species NOT DETECTED NOT DETECTED Final   Serratia marcescens NOT DETECTED NOT DETECTED Final   Haemophilus influenzae NOT DETECTED NOT DETECTED Final   Neisseria meningitidis NOT DETECTED NOT DETECTED Final   Pseudomonas aeruginosa NOT DETECTED NOT DETECTED Final   Candida albicans NOT DETECTED NOT DETECTED Final   Candida glabrata NOT DETECTED NOT DETECTED Final   Candida krusei NOT DETECTED NOT DETECTED Final   Candida parapsilosis NOT DETECTED NOT DETECTED Final   Candida tropicalis NOT DETECTED NOT DETECTED Final    Comment: Performed at [facility], [address], [place], [place] [address]  Blood Culture (routine x 2)     Status: Abnormal (Preliminary result)   Collection Time: 12/25/18 10:15 AM   Specimen: BLOOD LEFT HAND  Result Value Ref Range Status   Specimen Description   Final    BLOOD LEFT HAND Performed at [facility] [address], [place], [place] [address]    Special Requests   Final    BOTTLES DRAWN AEROBIC AND ANAEROBIC Blood Culture results may not be optimal due to an inadequate volume of blood received in culture bottles Performed at [facility] [address] [place], [place] [address]    Culture  Setup Time    Final    GRAM POSITIVE COCCI IN CLUSTERS IN BOTH AEROBIC AND ANAEROBIC BOTTLES CRITICAL VALUE NOTED.  VALUE IS CONSISTENT WITH PREVIOUSLY REPORTED AND CALLED VALUE. Performed at [facility], [place] [address], [place], [place] [address]    Culture STAPHYLOCOCCUS AUREUS (A)  Final   Report Status PENDING  Incomplete  SARS Coronavirus 2 (CEPHEID - Performed in [facility] hospital lab), Hosp Order     Status: None   Collection Time: 12/25/18  2:44 PM   Specimen: Nasopharyngeal Swab  Result  Value Ref Range Status   SARS Coronavirus 2 NEGATIVE NEGATIVE Final    Comment: (NOTE) If result is NEGATIVE SARS-CoV-2 target nucleic acids are NOT DETECTED. The SARS-CoV-2 RNA is generally detectable in upper and lower  respiratory specimens during the acute phase of infection. The lowest  concentration of SARS-CoV-2 viral copies this assay can detect is 250  copies / mL. A negative result does not preclude SARS-CoV-2 infection  and should not be used as the sole basis for treatment or other  patient management decisions.  A negative result may occur with  improper specimen collection / handling, submission of specimen other  than nasopharyngeal swab, presence of viral mutation(s) within the  areas targeted by this assay, and inadequate number of viral copies  (<250 copies / mL). A negative result must be combined with clinical  observations, patient history, and epidemiological information. If result is POSITIVE SARS-CoV-2 target nucleic acids are DETECTED. The SARS-CoV-2 RNA is generally detectable in upper and lower  respiratory specimens dur ing the acute phase of infection.  Positive  results are indicative of active infection with SARS-CoV-2.  Clinical  correlation with patient history and other diagnostic information is  necessary to determine patient infection status.  Positive results do  not rule out bacterial infection or co-infection with other viruses. If result is PRESUMPTIVE  POSTIVE SARS-CoV-2 nucleic acids MAY BE PRESENT.   A presumptive positive result was obtained on the submitted specimen  and confirmed on repeat testing.  While 2019 novel coronavirus  (SARS-CoV-2) nucleic acids may be present in the submitted sample  additional confirmatory testing may be necessary for epidemiological  and / or clinical management purposes  to differentiate between  SARS-CoV-2 and other Sarbecovirus currently known to infect humans.  If clinically indicated additional testing with an alternate test  methodology [phone]) is advised. The SARS-CoV-2 RNA is generally  detectable in upper and lower respiratory sp ecimens during the acute  phase of infection. The expected result is Negative. Fact Sheet for Patients:  StrictlyIdeas.no Fact Sheet for Healthcare Providers: BankingDealers.co.za This test is not yet approved or cleared by the [place] FDA and has been authorized for detection and/or diagnosis of SARS-CoV-2 by FDA under an Emergency Use Authorization (EUA).  This EUA will remain in effect (meaning this test can be used) for the duration of the COVID-19 declaration under Section 564(b)(1) of the Act, 21 U.S.C. section 360bbb-3(b)(1), unless the authorization is terminated or revoked sooner. Performed at [facility], [place] [address] [place], [place] [address]   MRSA PCR Screening     Status: None   Collection Time: 12/25/18  5:16 PM   Specimen: Nasal Mucosa; Nasopharyngeal  Result Value Ref Range Status   MRSA by PCR NEGATIVE NEGATIVE Final    Comment:        The GeneXpert MRSA Assay (FDA approved for NASAL specimens only), is one component of a comprehensive MRSA colonization surveillance program. It is not intended to diagnose MRSA infection nor to guide or monitor treatment for MRSA infections. Performed at [facility], [place] [address], [place], [place]  [address]   Culture, blood (routine x 2)     Status: None (Preliminary result)   Collection Time: 12/26/18  5:36 PM   Specimen: BLOOD  Result Value Ref Range Status   Specimen Description   Final    BLOOD RIGHT ANTECUBITAL Performed at [facility] [address] [place], [place] [address]    Special  Requests   Final    BOTTLES DRAWN AEROBIC ONLY Blood Culture adequate volume Performed at [facility] [address], [place], [place] [address]    Culture   Final    NO GROWTH < 12 HOURS Performed at [facility] [address] [place], [place] [address]    Report Status PENDING  Incomplete  Culture, blood (routine x 2)     Status: None (Preliminary result)   Collection Time: 12/26/18  5:36 PM   Specimen: BLOOD RIGHT HAND  Result Value Ref Range Status   Specimen Description   Final    BLOOD RIGHT HAND Performed at [facility] [address], [place], [place] [address]    Special Requests   Final    BOTTLES DRAWN AEROBIC ONLY Blood Culture adequate volume Performed at [facility] [address], [place], [place] [address]    Culture   Final    NO GROWTH < 12 HOURS Performed at [facility] [address], [place], [place] [address]    Report Status PENDING  Incomplete    Studies/Results: Vas [place] Lower Extremity Venous (dvt)  Result Date: 12/26/2018  Lower Venous Study Indications: Edema.  Comparison Study: previous study done 11/17/18 Performing Technologist: Abram Sander RVS  Examination Guidelines: A complete evaluation includes B-mode imaging, spectral Doppler, color Doppler, and power Doppler as needed of all accessible portions of each vessel. Bilateral testing is considered an integral part of a complete examination. Limited examinations for reoccurring indications may be performed as noted.  +---------+---------------+---------+-----------+----------+--------------+  RIGHT      Compressibility Phasicity Spontaneity Properties Summary         +---------+---------------+---------+-----------+----------+--------------+  CFV       Full            Yes       Yes                                    +---------+---------------+---------+-----------+----------+--------------+  SFJ       Full                                                             +---------+---------------+---------+-----------+----------+--------------+  FV Prox   Full                                                             +---------+---------------+---------+-----------+----------+--------------+  FV Mid    Full                                                             +---------+---------------+---------+-----------+----------+--------------+  FV Distal Full                                                             +---------+---------------+---------+-----------+----------+--------------+  PFV       Full                                                             +---------+---------------+---------+-----------+----------+--------------+  POP       Full            Yes       Yes                                    +---------+---------------+---------+-----------+----------+--------------+  PTV       Full                                                             +---------+---------------+---------+-----------+----------+--------------+  PERO                                                       Not visualized  +---------+---------------+---------+-----------+----------+--------------+   +---------+---------------+---------+-----------+----------+--------------+  LEFT      Compressibility Phasicity Spontaneity Properties Summary         +---------+---------------+---------+-----------+----------+--------------+  CFV       Full            Yes       Yes                                    +---------+---------------+---------+-----------+----------+--------------+  SFJ       Full                                                              +---------+---------------+---------+-----------+----------+--------------+  FV Prox   Full                                                             +---------+---------------+---------+-----------+----------+--------------+  FV Mid    Full                                                             +---------+---------------+---------+-----------+----------+--------------+  FV Distal Full                                                             +---------+---------------+---------+-----------+----------+--------------+  PFV       Full                                                             +---------+---------------+---------+-----------+----------+--------------+  POP       Full            Yes       Yes                                    +---------+---------------+---------+-----------+----------+--------------+  PTV       Full                                                             +---------+---------------+---------+-----------+----------+--------------+  PERO                                                       Not visualized  +---------+---------------+---------+-----------+----------+--------------+     Summary: Right: There is no evidence of deep vein thrombosis in the lower extremity. Left: No evidence of deep vein thrombosis in the lower extremity.  *See table(s) above for measurements and observations. Electronically signed by Curt Jews MD on 12/26/2018 at 1:51:48 PM.    Final      Assessment/Plan: Breast CA with mets to bone MSSA bacteremia  Total days of antibiotics: 2 (ancef)  Her TTE is negative Repeat BCx are pending. 7-31 As long as these are (-) would aim for 2 weeks of total anbx No change in anbx Will be available as needed 8-2.          Bobby Rumpf MD, FACP Infectious Diseases (pager) [phone] [email or web address] 12/27/2018, 4:19 PM  LOS: 2 days

## 2018-12-27 NOTE — Progress Notes (Signed)
Pt's skin assessed on admission. She had an open area on her sacrum, covered with foam dressing.Skin is intact, with some bruising noted on lower abdomen. Assessment done with [facility]. Thyra Breed RN BSN

## 2018-12-28 ENCOUNTER — Encounter: Payer: Self-pay | Admitting: Radiation Oncology

## 2018-12-28 LAB — CULTURE, BLOOD (ROUTINE X 2): Special Requests: ADEQUATE

## 2018-12-28 MED ORDER — ADULT MULTIVITAMIN W/MINERALS CH
1.0000 | ORAL_TABLET | Freq: Every day | ORAL | Status: DC
  Administered 2018-12-28 – 2019-01-24 (×27): 1 via ORAL
  Filled 2018-12-28 (×27): qty 1

## 2018-12-28 MED ORDER — PRO-STAT SUGAR FREE PO LIQD
30.0000 mL | Freq: Three times a day (TID) | ORAL | Status: DC
  Administered 2018-12-28 – 2019-01-02 (×5): 30 mL via ORAL
  Filled 2018-12-28 (×7): qty 30

## 2018-12-28 MED ORDER — IPRATROPIUM-ALBUTEROL 0.5-2.5 (3) MG/3ML IN SOLN
3.0000 mL | Freq: Two times a day (BID) | RESPIRATORY_TRACT | Status: DC
  Administered 2018-12-28 – 2018-12-29 (×3): 3 mL via RESPIRATORY_TRACT
  Filled 2018-12-28 (×3): qty 3

## 2018-12-28 MED ORDER — ENSURE ENLIVE PO LIQD
237.0000 mL | Freq: Two times a day (BID) | ORAL | Status: DC
  Administered 2018-12-28 – 2019-01-23 (×28): 237 mL via ORAL

## 2018-12-28 MED ORDER — METHYLPREDNISOLONE SODIUM SUCC 40 MG IJ SOLR
40.0000 mg | Freq: Every day | INTRAMUSCULAR | Status: DC
  Administered 2018-12-29 – 2019-01-05 (×8): 40 mg via INTRAVENOUS
  Filled 2018-12-28 (×8): qty 1

## 2018-12-28 NOTE — Consult Note (Signed)
[facility] Nurse wound consult note Patient receiving care in [place]. Reason for Consult: sacral wound.  I have sent a SecureChat message to Dr. Rhetta Mura asking for a photo of the wound to be placed in the record. Val Riles, RN, MSN, CWOCN, CNS-BC, pager [phone]

## 2018-12-28 NOTE — Progress Notes (Addendum)
PROGRESS NOTE    Suzanne [name]  WER:[id number] DOB: 1961/01/21 DOA: 12/25/2018 PCP: Patient, No Pcp Per    Brief Narrative:  58 y.o. 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  Patient was sitting bedside commode and she was not able to stand up this morning and she developed worsening shortness of breath.  She denies chest pain, cough, sore throat.  Her thought that patient has been tired because of multiple appointments. Patient appears mildly confused.  She is still having some shortness of breath.  She denies chest pain, abdominal pain.  Evaluation in the ED; patient was noted to be febrile temperature 100, tachycardic.  Labs sodium 136, potassium 3.6, CO2 33, BUN 12 calcium 7.0, alkaline phosphatase 235, albumin 2.3, LDH 244, ferritin 2306, CRP 19, procalcitonin less than 0.10, d-dimer 7.9, fibrinogen more than 800, white blood cell 8.1, hemoglobin 9.6, platelets 214.  BNP 89.  X-ray:Ventilation appears not significantly changed from the June CT where bilateral pleural effusions and nonspecific lung base airspace opacity was noted (please see that report). Diffuse osseous metastatic disease, including pathologic fracture of the right clavicle.  CT angio; Negative for pulmonary embolus or acute disease. Small bilateral pleural effusions. Effusion on the left has decreased since the prior CT. Bandlike opacities in both lungs may be due to scar or fibrosis, unchanged.     Assessment & Plan:   Active Problems:   Bone metastasis (HCC)   Pleural effusion, bilateral   Carcinoma of breast,  estrogen receptor positive, stage 4, right (HCC)   Acute on chronic respiratory failure with hypoxia (HCC)   Pneumonia   Respiratory failure, acute ([place])  1-Acute on chronic hypoxic respiratory failure: Probably related to pneumonia. Patient present with fever, worsening hypoxemia.  CT chest was negative for PE though showed chronic fibrosis.  Unable to rule out superimposed infection on CT.  Bilateral pleural effusion appears to be a small. -SARS coronavirus test neg x 2 -On scheduled albuterol. -Now on IV Solu-Medrol 60mg  IV q12hrs. Will decrease to 40mg  IV daily -Weaned off bipap, remains comfortable on minimal O2 support -Blood cx pos for MSSA. See below  2-Stage IV metastatic breast cancer to bone and lung, pleural effusion; Patient currently undergoing radiation.  She has a pathological proximal left humeral diaphysis fracture; pain management for now.  She is supposed to follow with Ortho as an outpatient. -Seen earlier by Oncology, stable  3-Anemia: of Malignancy Patient reports that she received a blood transfusion the day prior to admission. -Hemodynamically stable at present -hgb remains stable, labs were reviewed  4-elevated d-dimer:  CT angios negative for PE. LE dopplers reviewed, neg for DVT Stable presently. Possible elevated d-dimer related to malignancy  5-elevated alkaline phosphatase: Likely related to metastatic bone disease. -remains stable at present  6-Chronic diastolic Heart failure;  Given dose of IV lasix at time of presentation Of note BNP normal on presentation Clinically stable  7-MSSA bacteremia -Currently afebrile -Remains with no leukocytosis -Reviewed 2d echo, unremarkable -ID recs noted. Repeat blood cx thus far neg x2. Per ID if final repeat blood cx are neg, then anticipate 2 weeks of tx  8-L and R buttock  pressure injury, present on admission, stage 2 -Noted at time of presentation -Have consulted [place] noted  DVT  prophylaxis: Lovenox subQ Code Status: Full Family Communication: Pt in room, family not at bedside Disposition Plan: SNF, pending bed  Consultants:   Oncology  ID  Procedures:   LE dopplers 7/31, neg for dvt  2d echo 7/31  Antimicrobials: Anti-infectives (From admission, onward)   Start     Dose/Rate Route Frequency Ordered Stop   12/26/18 0600  ceFAZolin (ANCEF) IVPB 2g/100 mL premix     2 g 200 mL/hr over 30 Minutes Intravenous Every 8 hours 12/26/18 0445     12/25/18 2300  vancomycin (VANCOCIN) IVPB 1000 mg/200 mL premix  Status:  Discontinued     1,000 mg 200 mL/hr over 60 Minutes Intravenous Every 12 hours 12/25/18 1101 12/26/18 0444   12/25/18 2200  ceFEPIme (MAXIPIME) 2 g in sodium chloride 0.9 % 100 mL IVPB  Status:  Discontinued     2 g 200 mL/hr over 30 Minutes Intravenous Every 8 hours 12/25/18 1447 12/26/18 0444   12/25/18 1515  ceFEPIme (MAXIPIME) 1 g in sodium chloride 0.9 % 100 mL IVPB     1 g 200 mL/hr over 30 Minutes Intravenous  Once 12/25/18 1446 12/25/18 1728   12/25/18 1115  vancomycin (VANCOCIN) 2,000 mg in sodium chloride 0.9 % 500 mL IVPB     2,000 mg 250 mL/hr over 120 Minutes Intravenous  Once 12/25/18 1101 12/25/18 1405   12/25/18 1045  ceFEPIme (MAXIPIME) 1 g in sodium chloride 0.9 % 100 mL IVPB     1 g 200 mL/hr over 30 Minutes Intravenous  Once 12/25/18 1022 12/25/18 1207      Subjective: No complaints.  Objective: Vitals:   12/27/18 2238 12/28/18 0629 12/28/18 0725 12/28/18 1336  BP: 140/67 (!) 148/80  (!) 144/96  Pulse: 94 81  (!) 107  Resp: (!) 24 20  16   Temp: 98.6 F (37 C) 97.9 F (36.6 C)  97.7 F (36.5 C)  TempSrc: Oral Oral  Oral  SpO2: 94% 97% 98% 98%  Weight:  88.2 kg    Height:        Intake/Output Summary (Last 24 hours) at 12/28/2018 1632 Last data filed at 12/28/2018 1319 Gross per 24 hour  Intake 260 ml  Output 1700 ml  Net -1440 ml   Filed Weights   12/26/18 0500 12/27/18 0316 12/28/18 0629  Weight: 87.5  kg 90.4 kg 88.2 kg    Examination: General exam: Conversant, in no acute distress Respiratory system: normal chest rise, clear, no audible wheezing Cardiovascular system: regular rhythm, s1-s2 Gastrointestinal system: Nondistended, nontender, pos BS Central nervous system: No seizures, no tremors Extremities: No cyanosis, no joint deformities Skin: No rashes, no pallor Psychiatry: Affect normal // no auditory hallucinations   Data Reviewed: I have personally reviewed following labs and imaging studies  CBC: Recent Labs  Lab 12/22/18 1219 12/25/18 1009 12/26/18 0235 12/27/18 0213  WBC 6.6 8.1 6.6 8.1  NEUTROABS 4.0 5.0  --   --   HGB 7.0* 9.6* 8.2* 8.8*  HCT 22.6* 31.6* 27.8* 29.6*  MCV 101.3* 104.6* 106.1* 106.9*  PLT 141* 214 226 124   Basic Metabolic Panel: Recent Labs  Lab 12/22/18 1219 12/25/18 1009 12/26/18 0235 12/27/18 0213  NA 134* 136 139 139  K 3.5 3.6 3.2* 4.1  CL 91* 92* 96* 96*  CO2 34* 33* 35* 34*  GLUCOSE 154* 159* 172* 173*  BUN 15 12  13 17  CREATININE 0.63 0.40* 0.44 0.46  CALCIUM 7.5* 7.0* 6.1* 7.0*   GFR: Estimated Creatinine Clearance: 79.7 mL/min (by C-G formula based on SCr of 0.46 mg/dL). Liver Function Tests: Recent Labs  Lab 12/22/18 1219 12/25/18 1009  AST 61* 24  ALT 51* 35  ALKPHOS 247* 235*  BILITOT 0.5 0.6  PROT 6.5 7.0  ALBUMIN 1.9* 2.3*   No results for input(s): LIPASE, AMYLASE in the last 168 hours. No results for input(s): AMMONIA in the last 168 hours. Coagulation Profile: No results for input(s): INR, PROTIME in the last 168 hours. Cardiac Enzymes: No results for input(s): CKTOTAL, CKMB, CKMBINDEX, TROPONINI in the last 168 hours. BNP (last 3 results) No results for input(s): PROBNP in the last 8760 hours. HbA1C: No results for input(s): HGBA1C in the last 72 hours. CBG: No results for input(s): GLUCAP in the last 168 hours. Lipid Profile: No results for input(s): CHOL, HDL, LDLCALC, TRIG, CHOLHDL, LDLDIRECT  in the last 72 hours. Thyroid Function Tests: No results for input(s): TSH, T4TOTAL, FREET4, T3FREE, THYROIDAB in the last 72 hours. Anemia Panel: No results for input(s): VITAMINB12, FOLATE, FERRITIN, TIBC, IRON, RETICCTPCT in the last 72 hours. Sepsis Labs: Recent Labs  Lab 12/25/18 1009  PROCALCITON <0.10  LATICACIDVEN 1.0    Recent Results (from the past 240 hour(s))  SARS Coronavirus 2 [facility] order, Performed in [facility] hospital lab)     Status: None   Collection Time: 12/25/18 10:09 AM   Specimen: Nasopharyngeal Swab  Result Value Ref Range Status   SARS Coronavirus 2 NEGATIVE NEGATIVE Final    Comment: (NOTE) If result is NEGATIVE SARS-CoV-2 target nucleic acids are NOT DETECTED. The SARS-CoV-2 RNA is generally detectable in upper and lower  respiratory specimens during the acute phase of infection. The lowest  concentration of SARS-CoV-2 viral copies this assay can detect is 250  copies / mL. A negative result does not preclude SARS-CoV-2 infection  and should not be used as the sole basis for treatment or other  patient management decisions.  A negative result may occur with  improper specimen collection / handling, submission of specimen other  than nasopharyngeal swab, presence of viral mutation(s) within the  areas targeted by this assay, and inadequate number of viral copies  (<250 copies / mL). A negative result must be combined with clinical  observations, patient history, and epidemiological information. If result is POSITIVE SARS-CoV-2 target nucleic acids are DETECTED. The SARS-CoV-2 RNA is generally detectable in upper and lower  respiratory specimens dur ing the acute phase of infection.  Positive  results are indicative of active infection with SARS-CoV-2.  Clinical  correlation with patient history and other diagnostic information is  necessary to determine patient infection status.  Positive results do  not rule out bacterial infection or  co-infection with other viruses. If result is PRESUMPTIVE POSTIVE SARS-CoV-2 nucleic acids MAY BE PRESENT.   A presumptive positive result was obtained on the submitted specimen  and confirmed on repeat testing.  While 2019 novel coronavirus  (SARS-CoV-2) nucleic acids may be present in the submitted sample  additional confirmatory testing may be necessary for epidemiological  and / or clinical management purposes  to differentiate between  SARS-CoV-2 and other Sarbecovirus currently known to infect humans.  If clinically indicated additional testing with an alternate test  methodology [phone]) is advised. The SARS-CoV-2 RNA is generally  detectable in upper and lower respiratory sp ecimens during the acute  phase of infection. The expected  result is Negative. Fact Sheet for Patients:  StrictlyIdeas.no Fact Sheet for Healthcare Providers: BankingDealers.co.za This test is not yet approved or cleared by the [place] FDA and has been authorized for detection and/or diagnosis of SARS-CoV-2 by FDA under an Emergency Use Authorization (EUA).  This EUA will remain in effect (meaning this test can be used) for the duration of the COVID-19 declaration under Section 564(b)(1) of the Act, 21 U.S.C. section 360bbb-3(b)(1), unless the authorization is terminated or revoked sooner. Performed at [facility], [place] [address] [place], [place] [address]   Blood Culture (routine x 2)     Status: Abnormal   Collection Time: 12/25/18 10:09 AM   Specimen: BLOOD  Result Value Ref Range Status   Specimen Description   Final    BLOOD RIGHT ANTECUBITAL Performed at [facility] [address] [place], [place] [address]    Special Requests   Final    BOTTLES DRAWN AEROBIC AND ANAEROBIC Blood Culture adequate volume Performed at [facility] [address], [place], [place] [address]     Culture  Setup Time   Final    GRAM POSITIVE COCCI IN CLUSTERS IN BOTH AEROBIC AND ANAEROBIC BOTTLES CRITICAL RESULT CALLED TO, READ BACK BY AND VERIFIED WITH: Audrea Muscat [id number] 12/26/2018 Mena Goes Performed at [facility], [place] [address] [place], [place] [address]    Culture STAPHYLOCOCCUS AUREUS (A)  Final   Report Status 12/28/2018 FINAL  Final   Organism ID, Bacteria STAPHYLOCOCCUS AUREUS  Final      Susceptibility   Staphylococcus aureus - MIC*    CIPROFLOXACIN <=0.5 SENSITIVE Sensitive     ERYTHROMYCIN 1 INTERMEDIATE Intermediate     GENTAMICIN <=0.5 SENSITIVE Sensitive     OXACILLIN <=0.25 SENSITIVE Sensitive     TETRACYCLINE <=1 SENSITIVE Sensitive     VANCOMYCIN <=0.5 SENSITIVE Sensitive     TRIMETH/SULFA <=10 SENSITIVE Sensitive     CLINDAMYCIN RESISTANT Resistant     RIFAMPIN <=0.5 SENSITIVE Sensitive     Inducible Clindamycin POSITIVE Resistant     * STAPHYLOCOCCUS AUREUS  Blood Culture ID Panel (Reflexed)     Status: Abnormal   Collection Time: 12/25/18 10:09 AM  Result Value Ref Range Status   Enterococcus species NOT DETECTED NOT DETECTED Final   Listeria monocytogenes NOT DETECTED NOT DETECTED Final   Staphylococcus species DETECTED (A) NOT DETECTED Final    Comment: CRITICAL RESULT CALLED TO, READ BACK BY AND VERIFIED WITH: J. GRIMSLEY,PHARMD [id number] 12/26/2018 T. TYSOR    Staphylococcus aureus (BCID) DETECTED (A) NOT DETECTED Final    Comment: Methicillin (oxacillin) susceptible Staphylococcus aureus (MSSA). Preferred therapy is anti staphylococcal beta lactam antibiotic (Cefazolin or Nafcillin), unless clinically contraindicated. CRITICAL RESULT CALLED TO, READ BACK BY AND VERIFIED WITH: J. Fabiola Backer [phone] 12/26/2018 T. TYSOR    Methicillin resistance NOT DETECTED NOT DETECTED Final   Streptococcus species NOT DETECTED NOT DETECTED Final   Streptococcus agalactiae NOT DETECTED NOT DETECTED Final   Streptococcus pneumoniae NOT DETECTED NOT  DETECTED Final   Streptococcus pyogenes NOT DETECTED NOT DETECTED Final   Acinetobacter baumannii NOT DETECTED NOT DETECTED Final   Enterobacteriaceae species NOT DETECTED NOT DETECTED Final   Enterobacter cloacae complex NOT DETECTED NOT DETECTED Final   Escherichia coli NOT DETECTED NOT DETECTED Final   Klebsiella oxytoca NOT DETECTED NOT DETECTED Final   Klebsiella pneumoniae NOT DETECTED NOT DETECTED Final   Proteus species NOT DETECTED NOT DETECTED Final   Serratia  marcescens NOT DETECTED NOT DETECTED Final   Haemophilus influenzae NOT DETECTED NOT DETECTED Final   Neisseria meningitidis NOT DETECTED NOT DETECTED Final   Pseudomonas aeruginosa NOT DETECTED NOT DETECTED Final   Candida albicans NOT DETECTED NOT DETECTED Final   Candida glabrata NOT DETECTED NOT DETECTED Final   Candida krusei NOT DETECTED NOT DETECTED Final   Candida parapsilosis NOT DETECTED NOT DETECTED Final   Candida tropicalis NOT DETECTED NOT DETECTED Final    Comment: Performed at [facility], [place] [address], [place], [place] [address]  Blood Culture (routine x 2)     Status: Abnormal   Collection Time: 12/25/18 10:15 AM   Specimen: BLOOD LEFT HAND  Result Value Ref Range Status   Specimen Description   Final    BLOOD LEFT HAND Performed at [facility] [address], [place], [place] [address]    Special Requests   Final    BOTTLES DRAWN AEROBIC AND ANAEROBIC Blood Culture results may not be optimal due to an inadequate volume of blood received in culture bottles Performed at [facility] [address], [place], [place] [address]    Culture  Setup Time   Final    GRAM POSITIVE COCCI IN CLUSTERS IN BOTH AEROBIC AND ANAEROBIC BOTTLES CRITICAL VALUE NOTED.  VALUE IS CONSISTENT WITH PREVIOUSLY REPORTED AND CALLED VALUE.    Culture (A)  Final    STAPHYLOCOCCUS AUREUS SUSCEPTIBILITIES PERFORMED ON PREVIOUS CULTURE WITHIN THE LAST 5 DAYS. Performed at [facility], [place] [address], [place],  [address]    Report Status 12/28/2018 FINAL  Final  SARS Coronavirus 2 (CEPHEID - Performed in [place] hospital lab), Hosp Order     Status: None   Collection Time: 12/25/18  2:44 PM   Specimen: Nasopharyngeal Swab  Result Value Ref Range Status   SARS Coronavirus 2 NEGATIVE NEGATIVE Final    Comment: (NOTE) If result is NEGATIVE SARS-CoV-2 target nucleic acids are NOT DETECTED. The SARS-CoV-2 RNA is generally detectable in upper and lower  respiratory specimens during the acute phase of infection. The lowest  concentration of SARS-CoV-2 viral copies this assay can detect is 250  copies / mL. A negative result does not preclude SARS-CoV-2 infection  and should not be used as the sole basis for treatment or other  patient management decisions.  A negative result may occur with  improper specimen collection / handling, submission of specimen other  than nasopharyngeal swab, presence of viral mutation(s) within the  areas targeted by this assay, and inadequate number of viral copies  (<250 copies / mL). A negative result must be combined with clinical  observations, patient history, and epidemiological information. If result is POSITIVE SARS-CoV-2 target nucleic acids are DETECTED. The SARS-CoV-2 RNA is generally detectable in upper and lower  respiratory specimens dur ing the acute phase of infection.  Positive  results are indicative of active infection with SARS-CoV-2.  Clinical  correlation with patient history and other diagnostic information is  necessary to determine patient infection status.  Positive results do  not rule out bacterial infection or co-infection with other viruses. If result is PRESUMPTIVE POSTIVE SARS-CoV-2 nucleic acids MAY BE PRESENT.   A presumptive positive result was obtained on the submitted specimen  and confirmed on repeat testing.  While 2019 novel coronavirus  (SARS-CoV-2) nucleic acids may be  present in the submitted sample  additional confirmatory testing may be necessary for epidemiological  and / or  clinical management purposes  to differentiate between  SARS-CoV-2 and other Sarbecovirus currently known to infect humans.  If clinically indicated additional testing with an alternate test  methodology [phone]) is advised. The SARS-CoV-2 RNA is generally  detectable in upper and lower respiratory sp ecimens during the acute  phase of infection. The expected result is Negative. Fact Sheet for Patients:  StrictlyIdeas.no Fact Sheet for Healthcare Providers: BankingDealers.co.za This test is not yet approved or cleared by the [place] FDA and has been authorized for detection and/or diagnosis of SARS-CoV-2 by FDA under an Emergency Use Authorization (EUA).  This EUA will remain in effect (meaning this test can be used) for the duration of the COVID-19 declaration under Section 564(b)(1) of the Act, 21 U.S.C. section 360bbb-3(b)(1), unless the authorization is terminated or revoked sooner. Performed at [facility], [place] [address], [place], [place] [address]   MRSA PCR Screening     Status: None   Collection Time: 12/25/18  5:16 PM   Specimen: Nasal Mucosa; Nasopharyngeal  Result Value Ref Range Status   MRSA by PCR NEGATIVE NEGATIVE Final    Comment:        The GeneXpert MRSA Assay (FDA approved for NASAL specimens only), is one component of a comprehensive MRSA colonization surveillance program. It is not intended to diagnose MRSA infection nor to guide or monitor treatment for MRSA infections. Performed at [facility], [place] [address], [place], [place] [address]   Culture, blood (routine x 2)     Status: None (Preliminary result)   Collection Time: 12/26/18  5:36 PM   Specimen: BLOOD  Result Value Ref Range Status   Specimen Description   Final    BLOOD RIGHT ANTECUBITAL  Performed at [facility] [address], [place], [place] [address]    Special Requests   Final    BOTTLES DRAWN AEROBIC ONLY Blood Culture adequate volume Performed at [facility] [address], [place], [place] [address]    Culture   Final    NO GROWTH 2 DAYS Performed at [facility] [address], [place], [place] [address]    Report Status PENDING  Incomplete  Culture, blood (routine x 2)     Status: None (Preliminary result)   Collection Time: 12/26/18  5:36 PM   Specimen: BLOOD RIGHT HAND  Result Value Ref Range Status   Specimen Description   Final    BLOOD RIGHT HAND Performed at [facility] [address] [place], [place] [address]    Special Requests   Final    BOTTLES DRAWN AEROBIC ONLY Blood Culture adequate volume Performed at [facility] [address], [place], [place] [address]    Culture   Final    NO GROWTH 2 DAYS Performed at [facility] [address], [place], [place] [address]    Report Status PENDING  Incomplete     Radiology Studies: No results found.  Scheduled Meds: . anastrozole  1 mg Oral Daily  . calcium-vitamin D  1 tablet Oral TID  . carvedilol  6.25 mg Oral BID WC  . chlorhexidine  15 mL Mouth Rinse BID  . Chlorhexidine Gluconate Cloth  6 each Topical Q0600  . docusate sodium  100 mg Oral BID  . enoxaparin (LOVENOX) injection  40 mg Subcutaneous Q24H  . feeding supplement (ENSURE ENLIVE)  237 mL Oral BID BM  . feeding supplement (PRO-STAT SUGAR FREE  64)  30 mL Oral TID BM  . ipratropium-albuterol  3 mL Nebulization BID  . loratadine  10 mg Oral Daily  . mouth rinse  15 mL Mouth Rinse q12n4p  . methylPREDNISolone (SOLU-MEDROL) injection  60 mg Intravenous Q12H  . multivitamin with minerals  1 tablet Oral Daily  . nystatin   Topical TID  . sodium chloride flush  3 mL Intravenous Q12H   Continuous Infusions: . sodium chloride    .   ceFAZolin (ANCEF) IV 2 g (12/28/18 1319)     LOS: 3 days   Marylu Lund, MD [facility] Pager On Amion  If 7PM-7AM, please contact night-coverage 12/28/2018, 4:32 PM

## 2018-12-28 NOTE — Progress Notes (Signed)
Initial Nutrition Assessment  RD working remotely.   DOCUMENTATION CODES:   Obesity unspecified  INTERVENTION:  - will order Ensure Enlive BID, each supplement provides 350 kcal and 20 grams of protein. - will order 30 mL Prostat TID, each supplement provides 100 kcal and 15 grams of protein. - will order daily multivitamin with minerals. - continue to encourage PO intakes.    NUTRITION DIAGNOSIS:   Increased nutrient needs related to chronic illness, cancer and cancer related treatments as evidenced by estimated needs.  GOAL:   Patient will meet greater than or equal to 90% of their needs  MONITOR:   PO intake, Supplement acceptance, Labs, Weight trends, I & O's  REASON FOR ASSESSMENT:   Malnutrition Screening Tool  ASSESSMENT:   58 y.o. female with past medical history significant for stage 4 breast cancer with metastasis to bone, lung, and lymph node, chronic hypoxic respiratory failure on 2L of O2, CHF, malignant pleural effusion diagnosed in 10/2018. She presented to the ED complaining of worsening shortness of breath that started the morning of admission.  Unable to contact patient by phone. Per RN flow sheet, patient consumed 0% of breakfast and lunch and 20% of dinner yesterday; 10% of breakfast and 40% of lunch today. Patient was assessed remotely by this RD on 6/21 and 7/6 at which times patient reported decreased/poor appetite for at least a couple of weeks. She had not been experiencing any abdominal pain/pressure or nausea with intakes, no chewing or swallowing difficulties, and no taste changes at that time.   Per chart review, current weight is 194 lb. Of note, patient underwent paracentesis with 400 ml removed on 6/20 and repeat with 400 ml removed on 6/28. Suspect some degree of malnutrition but unable to confirm at this time .  Per notes: - acute on chronic hypoxic respiratory failure thought to be related to PNA - COVID-19 negative x2 - weaned off BiPAP -  undergoing radiation for metastatic breast cancer - anemia of malignancy - elevated alk phos related to metastatic disease - MSSA bacteremia   Labs reviewed; Cl: 96 mmol/l, Ca: 7 mg/dl.  Medications reviewed; 1 tablet Oscal-D TID, 100 mg colace BID, 60 mg solu-medrol BID.      NUTRITION - FOCUSED PHYSICAL EXAM:  unable to complete at this time.   Diet Order:   Diet Order            Diet regular Room service appropriate? Yes; Fluid consistency: Thin  Diet effective now              EDUCATION NEEDS:   Not appropriate for education at this time  Skin:  Skin Assessment: Skin Integrity Issues: Skin Integrity Issues:: DTI DTI: sacrum  Last BM:  7/30  Height:   Ht Readings from Last 1 Encounters:  12/27/18 '5\' 3"'  (1.6 m)    Weight:   Wt Readings from Last 1 Encounters:  12/28/18 88.2 kg    Ideal Body Weight:  52.3 kg  BMI:  Body mass index is 34.45 kg/m.  Estimated Nutritional Needs:   Kcal:  2350-2550 kcal  Protein:  130-140 grams  Fluid:  >/= 1.5 L/day      Jarome Matin, MS, RD, LDN, [facility] Inpatient Clinical Dietitian Pager # [phone] After hours/weekend pager # [phone]

## 2018-12-28 NOTE — Consult Note (Addendum)
[place] Nurse wound consult note Reason for Consult: Consult requested for sacrum/buttocks.  Pt was noted to have a deep tissue pressure injury to sacrum on the nursing flowsheet, which was present on admission.  This has evolved into stage 2 pressure injuries in patchy areas when assessed today. Pressure Injury POA: Yes Measurement: Left and right buttocks with two stage 2 pressure injuries; each is approx .2X.2X.2cm, pink and dry. Surrounded by dark red intact skin, approx 4X4cm Dressing procedure/placement/frequency: Foam dressing to promote healing and protect from further injury. Please re-consult if further assistance is needed.  Thank-you,  Julien Girt MSN, [place], [address], [place], [place]

## 2018-12-29 ENCOUNTER — Inpatient Hospital Stay: Payer: Medicaid Other | Attending: Adult Health

## 2018-12-29 ENCOUNTER — Telehealth: Payer: Self-pay

## 2018-12-29 ENCOUNTER — Inpatient Hospital Stay: Payer: Medicaid Other

## 2018-12-29 ENCOUNTER — Inpatient Hospital Stay: Payer: Medicaid Other | Admitting: Adult Health

## 2018-12-29 DIAGNOSIS — X58XXXD Exposure to other specified factors, subsequent encounter: Secondary | ICD-10-CM

## 2018-12-29 DIAGNOSIS — S42302D Unspecified fracture of shaft of humerus, left arm, subsequent encounter for fracture with routine healing: Secondary | ICD-10-CM

## 2018-12-29 DIAGNOSIS — R7881 Bacteremia: Secondary | ICD-10-CM

## 2018-12-29 DIAGNOSIS — T80219A Unspecified infection due to central venous catheter, initial encounter: Secondary | ICD-10-CM

## 2018-12-29 DIAGNOSIS — L89159 Pressure ulcer of sacral region, unspecified stage: Secondary | ICD-10-CM

## 2018-12-29 DIAGNOSIS — C50919 Malignant neoplasm of unspecified site of unspecified female breast: Secondary | ICD-10-CM

## 2018-12-29 DIAGNOSIS — B9561 Methicillin susceptible Staphylococcus aureus infection as the cause of diseases classified elsewhere: Secondary | ICD-10-CM

## 2018-12-29 NOTE — Progress Notes (Addendum)
PROGRESS NOTE    Suzanne [name]  OAC:[id number] DOB: 08/16/1960 DOA: 12/25/2018 PCP: Patient, No Pcp Per    Brief Narrative:  58 y.o. 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  Patient was sitting bedside commode and she was not able to stand up this morning and she developed worsening shortness of breath.  She denies chest pain, cough, sore throat.  Her thought that patient has been tired because of multiple appointments. Patient appears mildly confused.  She is still having some shortness of breath.  She denies chest pain, abdominal pain.  Evaluation in the ED; patient was noted to be febrile temperature 100, tachycardic.  Labs sodium 136, potassium 3.6, CO2 33, BUN 12 calcium 7.0, alkaline phosphatase 235, albumin 2.3, LDH 244, ferritin 2306, CRP 19, procalcitonin less than 0.10, d-dimer 7.9, fibrinogen more than 800, white blood cell 8.1, hemoglobin 9.6, platelets 214.  BNP 89.  X-ray:Ventilation appears not significantly changed from the June CT where bilateral pleural effusions and nonspecific lung base airspace opacity was noted (please see that report). Diffuse osseous metastatic disease, including pathologic fracture of the right clavicle.  CT angio; Negative for pulmonary embolus or acute disease. Small bilateral pleural effusions. Effusion on the left has decreased since the prior CT. Bandlike opacities in both lungs may be due to scar or fibrosis, unchanged.     Assessment & Plan:   Active Problems:   Bone metastasis (HCC)   Pleural effusion, bilateral   Carcinoma of breast,  estrogen receptor positive, stage 4, right (HCC)   Acute on chronic respiratory failure with hypoxia (HCC)   Pneumonia   Respiratory failure, acute ([facility] [place])  1-Acute on chronic hypoxic respiratory failure: Probably related to pneumonia. Patient present with fever, worsening hypoxemia.  CT chest was negative for PE though showed chronic fibrosis.  Unable to rule out superimposed infection on CT.  Bilateral pleural effusion appears to be a small. -SARS coronavirus test neg x 2 -On scheduled albuterol. -Now on IV Solu-Medrol 60mg  IV q12hrs. Will decrease to 40mg  IV daily -Weaned off bipap, remains comfortable on minimal O2 support -Blood cx pos for MSSA. Plan per below  2-Stage IV metastatic breast cancer to bone and lung, pleural effusion; Patient currently undergoing radiation.  She has a pathological proximal left humeral diaphysis fracture; pain management for now.  She is supposed to follow with Ortho as an outpatient. -Had been seen earlier by Oncology, stable  3-Anemia: of Malignancy Patient reports that she received a blood transfusion the day prior to admission. -Hemodynamically stable at present -hgb remains stable, labs were reviewed  4-elevated d-dimer:  CT angios negative for PE. LE dopplers reviewed, neg for DVT Stable currently. Possible elevated d-dimer related to malignancy  5-elevated alkaline phosphatase: Likely related to metastatic bone disease. -remains stable at present  6-Chronic diastolic Heart failure;  Given dose of IV lasix at time of presentation Of note BNP normal on presentation Presently  7-MSSA bacteremia -Currently afebrile -Remains with no leukocytosis -Reviewed 2d echo, unremarkable -ID recs noted. Repeat blood cx are neg x2. Per ID if final repeat blood cx are neg, then anticipate 2 weeks of tx  8-L and R  buttock pressure injury, present on admission, stage 2 -Noted at time of presentation -Have consulted [place], appreciate recs  DVT  prophylaxis: Lovenox subQ Code Status: Full Family Communication: Pt in room, family not at bedside Disposition Plan: SNF, pending bed  Consultants:   Oncology  ID  Procedures:   LE dopplers 7/31, neg for dvt  2d echo 7/31  Antimicrobials: Anti-infectives (From admission, onward)   Start     Dose/Rate Route Frequency Ordered Stop   12/26/18 0600  ceFAZolin (ANCEF) IVPB 2g/100 mL premix     2 g 200 mL/hr over 30 Minutes Intravenous Every 8 hours 12/26/18 0445     12/25/18 2300  vancomycin (VANCOCIN) IVPB 1000 mg/200 mL premix  Status:  Discontinued     1,000 mg 200 mL/hr over 60 Minutes Intravenous Every 12 hours 12/25/18 1101 12/26/18 0444   12/25/18 2200  ceFEPIme (MAXIPIME) 2 g in sodium chloride 0.9 % 100 mL IVPB  Status:  Discontinued     2 g 200 mL/hr over 30 Minutes Intravenous Every 8 hours 12/25/18 1447 12/26/18 0444   12/25/18 1515  ceFEPIme (MAXIPIME) 1 g in sodium chloride 0.9 % 100 mL IVPB     1 g 200 mL/hr over 30 Minutes Intravenous  Once 12/25/18 1446 12/25/18 1728   12/25/18 1115  vancomycin (VANCOCIN) 2,000 mg in sodium chloride 0.9 % 500 mL IVPB     2,000 mg 250 mL/hr over 120 Minutes Intravenous  Once 12/25/18 1101 12/25/18 1405   12/25/18 1045  ceFEPIme (MAXIPIME) 1 g in sodium chloride 0.9 % 100 mL IVPB     1 g 200 mL/hr over 30 Minutes Intravenous  Once 12/25/18 1022 12/25/18 1207      Subjective: Without complaints  Objective: Vitals:   12/28/18 2105 12/28/18 2127 12/29/18 0518 12/29/18 0731  BP:  (!) 147/82 131/80   Pulse:  96 89   Resp:  18 18   Temp:  97.9 F (36.6 C) 98 F (36.7 C)   TempSrc:  Oral Oral   SpO2: 98% 97% 99% 98%  Weight:   88.9 kg   Height:        Intake/Output Summary (Last 24 hours) at 12/29/2018 1402 Last data filed at 12/29/2018 1045 Gross per 24 hour  Intake 123 ml  Output 700 ml  Net -577 ml   Filed Weights   12/27/18 0316 12/28/18 0629 12/29/18 0518  Weight: 90.4 kg 88.2 kg 88.9 kg    Examination:  General exam: Awake, laying in bed, in nad Respiratory system: Normal respiratory effort, no wheezing Cardiovascular system: regular rate, s1, s2 Gastrointestinal system: Soft, nondistended, positive BS Central nervous system: CN2-12 grossly intact, strength intact Extremities: Perfused, no clubbing Skin: Normal skin turgor, stage2 buttock ulcers Psychiatry: Mood normal // no visual hallucinations    Data Reviewed: I have personally reviewed following labs and imaging studies  CBC: Recent Labs  Lab 12/25/18 1009 12/26/18 0235 12/27/18 0213  WBC 8.1 6.6 8.1  NEUTROABS 5.0  --   --   HGB 9.6* 8.2* 8.8*  HCT 31.6* 27.8* 29.6*  MCV 104.6* 106.1* 106.9*  PLT 214 226 419   Basic Metabolic Panel: Recent Labs  Lab 12/25/18 1009 12/26/18 0235 12/27/18 0213  NA 136 139 139  K 3.6 3.2* 4.1  CL 92* 96* 96*  CO2 33* 35* 34*  GLUCOSE 159* 172* 173*  BUN 12 13 17   CREATININE 0.40* 0.44 0.46  CALCIUM 7.0* 6.1* 7.0*   GFR: Estimated Creatinine Clearance: 80.1  mL/min (by C-G formula based on SCr of 0.46 mg/dL). Liver Function Tests: Recent Labs  Lab 12/25/18 1009  AST 24  ALT 35  ALKPHOS 235*  BILITOT 0.6  PROT 7.0  ALBUMIN 2.3*   No results for input(s): LIPASE, AMYLASE in the last 168 hours. No results for input(s): AMMONIA in the last 168 hours. Coagulation Profile: No results for input(s): INR, PROTIME in the last 168 hours. Cardiac Enzymes: No results for input(s): CKTOTAL, CKMB, CKMBINDEX, TROPONINI in the last 168 hours. BNP (last 3 results) No results for input(s): PROBNP in the last 8760 hours. HbA1C: No results for input(s): HGBA1C in the last 72 hours. CBG: No results for input(s): GLUCAP in the last 168 hours. Lipid Profile: No results for input(s): CHOL, HDL, LDLCALC, TRIG, CHOLHDL, LDLDIRECT in the last 72 hours. Thyroid Function Tests: No results for input(s): TSH, T4TOTAL, FREET4, T3FREE, THYROIDAB in the last 72 hours. Anemia Panel: No results for  input(s): VITAMINB12, FOLATE, FERRITIN, TIBC, IRON, RETICCTPCT in the last 72 hours. Sepsis Labs: Recent Labs  Lab 12/25/18 1009  PROCALCITON <0.10  LATICACIDVEN 1.0    Recent Results (from the past 240 hour(s))  SARS Coronavirus 2 [facility] order, Performed in [facility] hospital lab)     Status: None   Collection Time: 12/25/18 10:09 AM   Specimen: Nasopharyngeal Swab  Result Value Ref Range Status   SARS Coronavirus 2 NEGATIVE NEGATIVE Final    Comment: (NOTE) If result is NEGATIVE SARS-CoV-2 target nucleic acids are NOT DETECTED. The SARS-CoV-2 RNA is generally detectable in upper and lower  respiratory specimens during the acute phase of infection. The lowest  concentration of SARS-CoV-2 viral copies this assay can detect is 250  copies / mL. A negative result does not preclude SARS-CoV-2 infection  and should not be used as the sole basis for treatment or other  patient management decisions.  A negative result may occur with  improper specimen collection / handling, submission of specimen other  than nasopharyngeal swab, presence of viral mutation(s) within the  areas targeted by this assay, and inadequate number of viral copies  (<250 copies / mL). A negative result must be combined with clinical  observations, patient history, and epidemiological information. If result is POSITIVE SARS-CoV-2 target nucleic acids are DETECTED. The SARS-CoV-2 RNA is generally detectable in upper and lower  respiratory specimens dur ing the acute phase of infection.  Positive  results are indicative of active infection with SARS-CoV-2.  Clinical  correlation with patient history and other diagnostic information is  necessary to determine patient infection status.  Positive results do  not rule out bacterial infection or co-infection with other viruses. If result is PRESUMPTIVE POSTIVE SARS-CoV-2 nucleic acids MAY BE PRESENT.   A presumptive positive result was obtained on the submitted  specimen  and confirmed on repeat testing.  While 2019 novel coronavirus  (SARS-CoV-2) nucleic acids may be present in the submitted sample  additional confirmatory testing may be necessary for epidemiological  and / or clinical management purposes  to differentiate between  SARS-CoV-2 and other Sarbecovirus currently known to infect humans.  If clinically indicated additional testing with an alternate test  methodology [phone]) is advised. The SARS-CoV-2 RNA is generally  detectable in upper and lower respiratory sp ecimens during the acute  phase of infection. The expected result is Negative. Fact Sheet for Patients:  StrictlyIdeas.no Fact Sheet for Healthcare Providers: BankingDealers.co.za This test is not yet approved or cleared by the [place] FDA and  has been authorized for detection and/or diagnosis of SARS-CoV-2 by FDA under an Emergency Use Authorization (EUA).  This EUA will remain in effect (meaning this test can be used) for the duration of the COVID-19 declaration under Section 564(b)(1) of the Act, 21 U.S.C. section 360bbb-3(b)(1), unless the authorization is terminated or revoked sooner. Performed at [facility], [place] [address], [place], [place] [address]   Blood Culture (routine x 2)     Status: Abnormal   Collection Time: 12/25/18 10:09 AM   Specimen: BLOOD  Result Value Ref Range Status   Specimen Description   Final    BLOOD RIGHT ANTECUBITAL Performed at [facility] [address] [place], [place] [address]    Special Requests   Final    BOTTLES DRAWN AEROBIC AND ANAEROBIC Blood Culture adequate volume Performed at [place] [address] [place], [place] [address]    Culture  Setup Time   Final    GRAM POSITIVE COCCI IN CLUSTERS IN BOTH AEROBIC AND ANAEROBIC BOTTLES CRITICAL RESULT CALLED TO, READ BACK BY AND VERIFIED WITH: Audrea Muscat [id number] 12/26/2018 Mena Goes Performed at [facility], [place] [address], [place], [place] [address]    Culture STAPHYLOCOCCUS AUREUS (A)  Final   Report Status 12/28/2018 FINAL  Final   Organism ID, Bacteria STAPHYLOCOCCUS AUREUS  Final      Susceptibility   Staphylococcus aureus - MIC*    CIPROFLOXACIN <=0.5 SENSITIVE Sensitive     ERYTHROMYCIN 1 INTERMEDIATE Intermediate     GENTAMICIN <=0.5 SENSITIVE Sensitive     OXACILLIN <=0.25 SENSITIVE Sensitive     TETRACYCLINE <=1 SENSITIVE Sensitive     VANCOMYCIN <=0.5 SENSITIVE Sensitive     TRIMETH/SULFA <=10 SENSITIVE Sensitive     CLINDAMYCIN RESISTANT Resistant     RIFAMPIN <=0.5 SENSITIVE Sensitive     Inducible Clindamycin POSITIVE Resistant     * STAPHYLOCOCCUS AUREUS  Blood Culture ID Panel (Reflexed)     Status: Abnormal   Collection Time: 12/25/18 10:09 AM  Result Value Ref Range Status   Enterococcus species NOT DETECTED NOT DETECTED Final   Listeria monocytogenes NOT DETECTED NOT DETECTED Final   Staphylococcus species DETECTED (A) NOT DETECTED Final    Comment: CRITICAL RESULT CALLED TO, READ BACK BY AND VERIFIED WITH: J. GRIMSLEY,PHARMD [id number] 12/26/2018 T. TYSOR    Staphylococcus aureus (BCID) DETECTED (A) NOT DETECTED Final    Comment: Methicillin (oxacillin) susceptible Staphylococcus aureus (MSSA). Preferred therapy is anti staphylococcal beta lactam antibiotic (Cefazolin or Nafcillin), unless clinically contraindicated. CRITICAL RESULT CALLED TO, READ BACK BY AND VERIFIED WITH: J. Fabiola Backer [id number] 12/26/2018 T. TYSOR    Methicillin resistance NOT DETECTED NOT DETECTED Final   Streptococcus species NOT DETECTED NOT DETECTED Final   Streptococcus agalactiae NOT DETECTED NOT DETECTED Final   Streptococcus pneumoniae NOT DETECTED NOT DETECTED Final   Streptococcus pyogenes NOT DETECTED NOT DETECTED Final   Acinetobacter baumannii NOT DETECTED NOT DETECTED Final   Enterobacteriaceae species NOT  DETECTED NOT DETECTED Final   Enterobacter cloacae complex NOT DETECTED NOT DETECTED Final   Escherichia coli NOT DETECTED NOT DETECTED Final   Klebsiella oxytoca NOT DETECTED NOT DETECTED Final   Klebsiella pneumoniae NOT DETECTED NOT DETECTED Final   Proteus species NOT DETECTED NOT DETECTED Final   Serratia marcescens NOT DETECTED NOT DETECTED Final   Haemophilus influenzae NOT DETECTED NOT DETECTED Final   Neisseria meningitidis NOT DETECTED NOT DETECTED Final   Pseudomonas aeruginosa NOT  DETECTED NOT DETECTED Final   Candida albicans NOT DETECTED NOT DETECTED Final   Candida glabrata NOT DETECTED NOT DETECTED Final   Candida krusei NOT DETECTED NOT DETECTED Final   Candida parapsilosis NOT DETECTED NOT DETECTED Final   Candida tropicalis NOT DETECTED NOT DETECTED Final    Comment: Performed at [facility], [place] [address], [place], [place] [address]  Blood Culture (routine x 2)     Status: Abnormal   Collection Time: 12/25/18 10:15 AM   Specimen: BLOOD LEFT HAND  Result Value Ref Range Status   Specimen Description   Final    BLOOD LEFT HAND Performed at [facility] [address], [place], [place] [address]    Special Requests   Final    BOTTLES DRAWN AEROBIC AND ANAEROBIC Blood Culture results may not be optimal due to an inadequate volume of blood received in culture bottles Performed at [facility] [address] [place], [place] [address]    Culture  Setup Time   Final    GRAM POSITIVE COCCI IN CLUSTERS IN BOTH AEROBIC AND ANAEROBIC BOTTLES CRITICAL VALUE NOTED.  VALUE IS CONSISTENT WITH PREVIOUSLY REPORTED AND CALLED VALUE.    Culture (A)  Final    STAPHYLOCOCCUS AUREUS SUSCEPTIBILITIES PERFORMED ON PREVIOUS CULTURE WITHIN THE LAST 5 DAYS. Performed at [facility], [place] [address], [place], [place] [address]    Report Status 12/28/2018 FINAL  Final  SARS Coronavirus 2 (CEPHEID - Performed in [place] hospital  lab), Hosp Order     Status: None   Collection Time: 12/25/18  2:44 PM   Specimen: Nasopharyngeal Swab  Result Value Ref Range Status   SARS Coronavirus 2 NEGATIVE NEGATIVE Final    Comment: (NOTE) If result is NEGATIVE SARS-CoV-2 target nucleic acids are NOT DETECTED. The SARS-CoV-2 RNA is generally detectable in upper and lower  respiratory specimens during the acute phase of infection. The lowest  concentration of SARS-CoV-2 viral copies this assay can detect is 250  copies / mL. A negative result does not preclude SARS-CoV-2 infection  and should not be used as the sole basis for treatment or other  patient management decisions.  A negative result may occur with  improper specimen collection / handling, submission of specimen other  than nasopharyngeal swab, presence of viral mutation(s) within the  areas targeted by this assay, and inadequate number of viral copies  (<250 copies / mL). A negative result must be combined with clinical  observations, patient history, and epidemiological information. If result is POSITIVE SARS-CoV-2 target nucleic acids are DETECTED. The SARS-CoV-2 RNA is generally detectable in upper and lower  respiratory specimens dur ing the acute phase of infection.  Positive  results are indicative of active infection with SARS-CoV-2.  Clinical  correlation with patient history and other diagnostic information is  necessary to determine patient infection status.  Positive results do  not rule out bacterial infection or co-infection with other viruses. If result is PRESUMPTIVE POSTIVE SARS-CoV-2 nucleic acids MAY BE PRESENT.   A presumptive positive result was obtained on the submitted specimen  and confirmed on repeat testing.  While 2019 novel coronavirus  (SARS-CoV-2) nucleic acids may be present in the submitted sample  additional confirmatory testing may be necessary for epidemiological  and / or clinical management purposes  to differentiate between   SARS-CoV-2 and other Sarbecovirus currently known to infect humans.  If clinically indicated additional testing with an alternate test  methodology (  [id number]) is advised. The SARS-CoV-2 RNA is generally  detectable in upper and lower respiratory sp ecimens during the acute  phase of infection. The expected result is Negative. Fact Sheet for Patients:  StrictlyIdeas.no Fact Sheet for Healthcare Providers: BankingDealers.co.za This test is not yet approved or cleared by the [place] FDA and has been authorized for detection and/or diagnosis of SARS-CoV-2 by FDA under an Emergency Use Authorization (EUA).  This EUA will remain in effect (meaning this test can be used) for the duration of the COVID-19 declaration under Section 564(b)(1) of the Act, 21 U.S.C. section 360bbb-3(b)(1), unless the authorization is terminated or revoked sooner. Performed at [facility], [place] [address], [place], [place] [address]   MRSA PCR Screening     Status: None   Collection Time: 12/25/18  5:16 PM   Specimen: Nasal Mucosa; Nasopharyngeal  Result Value Ref Range Status   MRSA by PCR NEGATIVE NEGATIVE Final    Comment:        The GeneXpert MRSA Assay (FDA approved for NASAL specimens only), is one component of a comprehensive MRSA colonization surveillance program. It is not intended to diagnose MRSA infection nor to guide or monitor treatment for MRSA infections. Performed at [facility], [place] [address], [place], [place] [address]   Culture, blood (routine x 2)     Status: None (Preliminary result)   Collection Time: 12/26/18  5:36 PM   Specimen: BLOOD  Result Value Ref Range Status   Specimen Description   Final    BLOOD RIGHT ANTECUBITAL Performed at [facility] [address] [place], [place] [address]    Special Requests   Final    BOTTLES DRAWN AEROBIC ONLY Blood Culture  adequate volume Performed at [place] [address] [place], [place] [address]    Culture   Final    NO GROWTH 3 DAYS Performed at [facility], [place] [address] [place], [place] [address]    Report Status PENDING  Incomplete  Culture, blood (routine x 2)     Status: None (Preliminary result)   Collection Time: 12/26/18  5:36 PM   Specimen: BLOOD RIGHT HAND  Result Value Ref Range Status   Specimen Description   Final    BLOOD RIGHT HAND Performed at [facility] [address] [place], [place] [address]    Special Requests   Final    BOTTLES DRAWN AEROBIC ONLY Blood Culture adequate volume Performed at [facility] [address] [place], [place] [address]    Culture   Final    NO GROWTH 3 DAYS Performed at [facility], [place] [address], [place], [place] [address]    Report Status PENDING  Incomplete     Radiology Studies: No results found.  Scheduled Meds: . anastrozole  1 mg Oral Daily  . calcium-vitamin D  1 tablet Oral TID  . carvedilol  6.25 mg Oral BID WC  . chlorhexidine  15 mL Mouth Rinse BID  . Chlorhexidine Gluconate Cloth  6 each Topical Q0600  . docusate sodium  100 mg Oral BID  . enoxaparin (LOVENOX) injection  40 mg Subcutaneous Q24H  . feeding supplement (ENSURE ENLIVE)  237 mL Oral BID BM  . feeding supplement (PRO-STAT SUGAR FREE 64)  30 mL Oral TID BM  . ipratropium-albuterol  3 mL Nebulization BID  . loratadine  10 mg Oral Daily  . mouth rinse  15  mL Mouth Rinse q12n4p  . methylPREDNISolone (SOLU-MEDROL) injection  40 mg Intravenous Daily  . multivitamin with minerals  1 tablet Oral Daily  . nystatin   Topical TID  . sodium chloride flush  3 mL Intravenous Q12H   Continuous Infusions: . sodium chloride    .  ceFAZolin (ANCEF) IV 2 g (12/29/18 0459)     LOS: 4 days   Marylu Lund, MD [facility] Pager On Amion  If 7PM-7AM, please contact night-coverage  12/29/2018, 2:02 PM

## 2018-12-29 NOTE — Telephone Encounter (Signed)
Left message for patient to verify telephone for pre reg

## 2018-12-29 NOTE — Progress Notes (Signed)
Repositioned patient from side to back when she would let me. Daughter in to visit,

## 2018-12-29 NOTE — Progress Notes (Deleted)
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 She was brought with [facility] transportation.    She was started on Anastrozole in early June, 2020.  She says that she tolerates this well and hasn't noted hot flashes, arthralgias, or vaginal dryness.    Tristin notes that she is taking Palbociclib.    She receives xgeva every 4 weeks and her last dose was on 7/16.    REVIEW OF SYSTEMS: Adylin    HISTORY OF CURRENT ILLNESS: From the original inpatient consult note:  Ms. Mcneff is a 58 year old female from [place], [place] without significant past medical history with exception of obesity, remote history of gallstones, and remote history of mastitis.  The patient presented to the hospital on 10/26/2018 with a 3-week history of worsening shortness of breath and lower extremity edema.  The patient reported that her shortness of breath worsened with exertion.  Her lower extremity edema did not improve with elevation.  She also had an intermittent pr oductive cough with rare sputum production.  The patient was noted to be anemic with a hemoglobin of 4.5 with a clumped platelets noted on admission.  Stool for occult blood was positive.  Patient received 2 units of packed red blood cells.    The patient reported a rash under her breast x1 week which has been foul-smelling.  She also reported that she has some discomfort and thickening/hardness of her right breast for several months.  She states that her right breast is much larger than her  left.  She notes some occasional bloody discharge from her breast once in a while.  A chest x-ray on admission showed diffuse bilateral interstitial opacity of unknown chronicity, findings could be related to interstitial inflammatory process, atypical/viral pneumonia, or possible metastatic disease.  There are also sclerotic and lytic lesions within the clavicles, bilateral ribs, shoulders which was concerning for diffuse few skeletal metastatic disease.    This prompted a CT scan of the chest with contrast which showed bilateral axillary and mediastinal adenopathy, borderline hilar lymph nodes bilaterally, findings likely flecked metastatic lymphadenopathy, moderate bilateral pleural effusions, extensive interstitial thickening and reticulonodular opacities throughout the lungs, cannot exclude lymphangitic spread of tumor, diffuse skeletal metastases.  The patient's subsequent history is as detailed below.   PAST MEDICAL HISTORY: Past Medical History:  Diagnosis Date  . Cancer ([place])   . Family history of breast cancer   . Family history of colon cancer   . Family history of melanoma   . Family history of prostate cancer   . Gallstones   . Glaucoma   . Mastitis    reports history of recurrent mastitis  . Metastatic breast cancer ([place])   . Obesity     PAST SURGICAL HISTORY: Past Surgical History:  Procedure Laterality Date  . GLAUCOMA SURGERY  late 1990's  . IR THORACENTESIS ASP PLEURAL SPACE W/IMG GUIDE  10/28/2018  . IR THORACENTESIS ASP PLEURAL SPACE W/IMG GUIDE  10/29/2018    FAMILY HISTORY: Family History  Problem Relation Age of Onset  . Breast cancer Sister        in  her 60's-70s  . Heart disease Brother        CABG  . Heart disease Brother        CABG  . Skin cancer Brother        melanoma  . Colon cancer Cousin   . Heart attack Mother   . Heart attack Father   . Prostate cancer Brother   Patient's father passed away at age 65, and her mother at 14, both from heart  attacks. The patient has 8 siblings, 6 brothers and 1 sister.  Her sister was diagnosed with breast cancer. One brother has prostate cancer, another brother had a melanoma removed.    GYNECOLOGIC HISTORY:  Menarche: 58 years old Age at first live birth: 58 years old [place] P 1 LMP age 36 Contraceptive: used for 2-3 years with no problems HRT never used  Hysterectomy? no   SOCIAL HISTORY:  Shereece works for an Engineer, manufacturing systems. She is widowed. She lives at home with her daughter. Daughter [name], age 21, is a Psychologist, occupational at the science center.  The patient has no grandchildren. She is not a Ambulance person.   ADVANCED DIRECTIVES: not on file; she has the paperwork already. She intends to name her daughter as her HCPOA.   HEALTH MAINTENANCE: Social History   Tobacco Use  . Smoking status: Never Smoker  . Smokeless tobacco: Never Used  . Tobacco comment: second hand smoke exposure  Substance Use Topics  . Alcohol use: No  . Drug use: No    Colonoscopy:   PAP:   Bone density:  Mammography:   No Known Allergies  No current facility-administered medications for this visit.    No current outpatient medications on file.   Facility-Administered Medications Ordered in Other Visits  Medication Dose Route Frequency Provider Last Rate Last Dose  . 0.9 %  sodium chloride infusion  250 mL Intravenous PRN Donne Hazel, MD      . acetaminophen (TYLENOL) tablet 650 mg  650 mg Oral Q6H PRN Donne Hazel, MD   650 mg at 12/26/18 2054   Or  . acetaminophen (TYLENOL) suppository 650 mg  650 mg Rectal Q6H PRN Donne Hazel, MD      . ALPRAZolam Duanne Moron) tablet 0.25-0.5 mg  0.25-0.5 mg Oral Q8H PRN Donne Hazel, MD      . anastrozole (ARIMIDEX) tablet 1 mg  1 mg Oral Daily Donne Hazel, MD   1 mg at 12/28/18 1038  . antiseptic oral rinse (BIOTENE) solution 15 mL  15 mL Mouth Rinse PRN Donne Hazel, MD      . calcium-vitamin D (OSCAL WITH D) 500-200 MG-UNIT per tablet 1  tablet  1 tablet Oral TID Donne Hazel, MD   1 tablet at 12/28/18 2205  . carvedilol (COREG) tablet 6.25 mg  6.25 mg Oral BID WC Donne Hazel, MD   6.25 mg at 12/28/18 1732  . ceFAZolin (ANCEF) IVPB 2g/100 mL premix  2 g Intravenous Q8H Donne Hazel, MD 200 mL/hr at 12/29/18 0459 2 g at 12/29/18 0459  . chlorhexidine (PERIDEX) 0.12 % solution 15 mL  15 mL Mouth Rinse BID Donne Hazel, MD   15 mL at 12/28/18 2205  . Chlorhexidine Gluconate Cloth 2 % PADS 6 each  6 each Topical Q0600 Donne Hazel, MD   6 each at 12/27/18 [phone]  . docusate sodium (COLACE) capsule 100 mg  100 mg Oral BID Donne Hazel, MD  100 mg at 12/28/18 2205  . enoxaparin (LOVENOX) injection 40 mg  40 mg Subcutaneous Q24H Donne Hazel, MD   40 mg at 12/28/18 2205  . feeding supplement (ENSURE ENLIVE) (ENSURE ENLIVE) liquid 237 mL  237 mL Oral BID BM Donne Hazel, MD   237 mL at 12/28/18 2206  . feeding supplement (PRO-STAT SUGAR FREE 64) liquid 30 mL  30 mL Oral TID BM Donne Hazel, MD   30 mL at 12/28/18 1751  . ipratropium-albuterol (DUONEB) 0.5-2.5 (3) MG/3ML nebulizer solution 3 mL  3 mL Nebulization BID Donne Hazel, MD   3 mL at 12/29/18 0730  . loratadine (CLARITIN) tablet 10 mg  10 mg Oral Daily Donne Hazel, MD   10 mg at 12/28/18 1038  . MEDLINE mouth rinse  15 mL Mouth Rinse q12n4p Donne Hazel, MD   15 mL at 12/28/18 1740  . methylPREDNISolone sodium succinate (SOLU-MEDROL) 40 mg/mL injection 40 mg  40 mg Intravenous Daily Donne Hazel, MD      . multivitamin with minerals tablet 1 tablet  1 tablet Oral Daily Donne Hazel, MD   1 tablet at 12/28/18 1737  . nystatin (MYCOSTATIN/NYSTOP) topical powder   Topical TID Donne Hazel, MD      . ondansetron [facility]) tablet 4 mg  4 mg Oral Q6H PRN Donne Hazel, MD       Or  . ondansetron [facility]) injection 4 mg  4 mg Intravenous Q6H PRN Donne Hazel, MD      . oxyCODONE (Oxy IR/ROXICODONE) immediate release tablet 2.5 mg  2.5 mg  Oral Q4H PRN Donne Hazel, MD   2.5 mg at 12/27/18 5631  . polyethylene glycol (MIRALAX / GLYCOLAX) packet 17 g  17 g Oral Daily PRN Donne Hazel, MD      . sodium chloride flush (NS) 0.9 % injection 3 mL  3 mL Intravenous Q12H Donne Hazel, MD   3 mL at 12/27/18 2223  . sodium chloride flush (NS) 0.9 % injection 3 mL  3 mL Intravenous PRN Donne Hazel, MD         OBJECTIVE:   There were no vitals filed for this visit.   There is no height or weight on file to calculate BMI.   Wt Readings from Last 3 Encounters:  12/29/18 196 lb (88.9 kg)  12/01/18 191 lb 1.6 oz (86.7 kg)  10/30/18 234 lb 2.1 oz (106.2 kg)  ECOG FS:2 GENERAL: Patient is morbidly obese, appears tired and chronically ill, wearing oxygen Examined in wheelchair, exam limited due to disability and body habitus HEENT:  Sclerae anicteric.  Oropharynx clear and moist. No ulcerations or evidence of oropharyngeal candidiasis. Neck is supple.  NODES:  No cervical, supraclavicular, or axillary lymphadenopathy palpated.  BREAST EXAM:  Deferred. LUNGS:  Diminished breath sounds in bilateral bases, no wheezes, rhonchi or rales HEART:  Regular rate and rhythm. No murmur appreciated. ABDOMEN:  Soft, nontender.  Positive, normoactive bowel sounds. No organomegaly palpated. MSK:  No focal spinal tenderness to palpation.  EXTREMITIES:  +1-2 bilateral lower extremity edema SKIN:  Clear with no obvious rashes or skin changes. No nail dyscrasia. NEURO:  Nonfocal. Well oriented.  Appropriate affect.     LAB RESULTS:  CMP     Component Value Date/Time   NA 139 12/27/2018 0213   K 4.1 12/27/2018 0213   CL 96 (L) 12/27/2018 0213   CO2 34 (H) 12/27/2018 4970  GLUCOSE 173 (H) 12/27/2018 0213   BUN 17 12/27/2018 0213   CREATININE 0.46 12/27/2018 0213   CREATININE 0.63 12/22/2018 1219   CALCIUM 7.0 (L) 12/27/2018 0213   PROT 7.0 12/25/2018 1009   ALBUMIN 2.3 (L) 12/25/2018 1009   AST 24 12/25/2018 1009   AST 61 (H)  12/22/2018 1219   ALT 35 12/25/2018 1009   ALT 51 (H) 12/22/2018 1219   ALKPHOS 235 (H) 12/25/2018 1009   BILITOT 0.6 12/25/2018 1009   BILITOT 0.5 12/22/2018 1219   GFRNONAA >60 12/27/2018 0213   GFRNONAA >60 12/22/2018 1219   GFRAA >60 12/27/2018 0213   GFRAA >60 12/22/2018 1219    No results found for: TOTALPROTELP, ALBUMINELP, A1GS, A2GS, BETS, BETA2SER, GAMS, MSPIKE, SPEI  No results found for: KPAFRELGTCHN, LAMBDASER, [facility]  Lab Results  Component Value Date   WBC 8.1 12/27/2018   NEUTROABS 5.0 12/25/2018   HGB 8.8 (L) 12/27/2018   HCT 29.6 (L) 12/27/2018   MCV 106.9 (H) 12/27/2018   PLT 282 12/27/2018    '@LASTCHEMISTRY' @  No results found for: LABCA2  No components found for: LGXQJJ941  No results for input(s): INR in the last 168 hours.  No results found for: LABCA2  No results found for: DEY814  No results found for: GYJ856  No results found for: DJS970  Lab Results  Component Value Date   CA2729 163.4 (H) 12/22/2018    No components found for: HGQUANT  No results found for: CEA1 / No results found for: CEA1   No results found for: AFPTUMOR  No results found for: [place]  No results found for: PSA1  Admission on 12/25/2018  Component Date Value Ref Range Status  . B Natriuretic Peptide 12/25/2018 89.0  0.0 - 100.0 pg/mL Final   Performed at [facility] [address], [place], [place] [address]  . WBC 12/25/2018 8.1  4.0 - 10.5 K/uL Final   WHITE COUNT CONFIRMED ON SMEAR  . RBC 12/25/2018 3.02* 3.87 - 5.11 MIL/uL Final  . Hemoglobin 12/25/2018 9.6* 12.0 - 15.0 g/dL Final  . HCT 12/25/2018 31.6* 36.0 - 46.0 % Final  . MCV 12/25/2018 104.6* 80.0 - 100.0 fL Final  . MCH 12/25/2018 31.8  26.0 - 34.0 pg Final  . MCHC 12/25/2018 30.4  30.0 - 36.0 g/dL Final  . RDW 12/25/2018 19.7* 11.5 - 15.5 % Final  . Platelets 12/25/2018 214  150 - 400 K/uL Final  . nRBC 12/25/2018 5.3* 0.0 - 0.2 % Final  . Neutrophils  Relative % 12/25/2018 62  % Final  . Neutro Abs 12/25/2018 5.0  1.7 - 7.7 K/uL Final  . Lymphocytes Relative 12/25/2018 28  % Final  . Lymphs Abs 12/25/2018 2.2  0.7 - 4.0 K/uL Final  . Monocytes Relative 12/25/2018 7  % Final  . Monocytes Absolute 12/25/2018 0.5  0.1 - 1.0 K/uL Final  . Eosinophils Relative 12/25/2018 1  % Final  . Eosinophils Absolute 12/25/2018 0.0  0.0 - 0.5 K/uL Final  . Basophils Relative 12/25/2018 0  % Final  . Basophils Absolute 12/25/2018 0.0  0.0 - 0.1 K/uL Final  . WBC Morphology 12/25/2018 MILD LEFT SHIFT (1-5% METAS, OCC MYELO, OCC BANDS)   Final   VACUOLATED NEUTROPHILS  . Immature Granulocytes 12/25/2018 2  % Final  . Abs Immature Granulocytes 12/25/2018 0.19* 0.00 - 0.07 K/uL Final  . Polychromasia 12/25/2018 PRESENT   Final   Performed at [facility], [place] [address] [place],  [place] [address]  . SARS Coronavirus 2 12/25/2018 NEGATIVE  NEGATIVE Final   Comment: (NOTE) If result is NEGATIVE SARS-CoV-2 target nucleic acids are NOT DETECTED. The SARS-CoV-2 RNA is generally detectable in upper and lower  respiratory specimens during the acute phase of infection. The lowest  concentration of SARS-CoV-2 viral copies this assay can detect is 250  copies / mL. A negative result does not preclude SARS-CoV-2 infection  and should not be used as the sole basis for treatment or other  patient management decisions.  A negative result may occur with  improper specimen collection / handling, submission of specimen other  than nasopharyngeal swab, presence of viral mutation(s) within the  areas targeted by this assay, and inadequate number of viral copies  (<250 copies / mL). A negative result must be combined with clinical  observations, patient history, and epidemiological information. If result is POSITIVE SARS-CoV-2 target nucleic acids are DETECTED. The SARS-CoV-2 RNA is generally detectable in upper and lower  respiratory specimens dur                           ing the acute phase of infection.  Positive  results are indicative of active infection with SARS-CoV-2.  Clinical  correlation with patient history and other diagnostic information is  necessary to determine patient infection status.  Positive results do  not rule out bacterial infection or co-infection with other viruses. If result is PRESUMPTIVE POSTIVE SARS-CoV-2 nucleic acids MAY BE PRESENT.   A presumptive positive result was obtained on the submitted specimen  and confirmed on repeat testing.  While 2019 novel coronavirus  (SARS-CoV-2) nucleic acids may be present in the submitted sample  additional confirmatory testing may be necessary for epidemiological  and / or clinical management purposes  to differentiate between  SARS-CoV-2 and other Sarbecovirus currently known to infect humans.  If clinically indicated additional testing with an alternate test  methodology [phone]) is advised. The SARS-CoV-2 RNA is generally  detectable in upper and lower respiratory sp                          ecimens during the acute  phase of infection. The expected result is Negative. Fact Sheet for Patients:  StrictlyIdeas.no Fact Sheet for Healthcare Providers: BankingDealers.co.za This test is not yet approved or cleared by the [place] FDA and has been authorized for detection and/or diagnosis of SARS-CoV-2 by FDA under an Emergency Use Authorization (EUA).  This EUA will remain in effect (meaning this test can be used) for the duration of the COVID-19 declaration under Section 564(b)(1) of the Act, 21 U.S.C. section 360bbb-3(b)(1), unless the authorization is terminated or revoked sooner. Performed at [facility], [place] [address] [place], [place] [address]   . Lactic Acid, Venous 12/25/2018 1.0  0.5 - 1.9 mmol/L Final   Performed at [facility] [address] [place],  [place] [address]  . Specimen Description 12/25/2018    Final                   Value:BLOOD RIGHT ANTECUBITAL Performed at [facility], [place] [address], [place], [place] [address]   . Special Requests 12/25/2018    Final                   Value:BOTTLES DRAWN AEROBIC AND ANAEROBIC Blood Culture adequate volume Performed at [facility], [place] [address] [place], [place] [address]   . Culture  Setup Time 12/25/2018    Final                   Value:GRAM POSITIVE COCCI IN CLUSTERS IN BOTH AEROBIC AND ANAEROBIC BOTTLES CRITICAL RESULT CALLED TO, READ BACK BY AND VERIFIED WITH: Audrea Muscat [id number] 12/26/2018 Mena Goes Performed at [facility], [place] [address] [place], [place] [address]   . Culture 12/25/2018 STAPHYLOCOCCUS AUREUS*  Final  . Report Status 12/25/2018 12/28/2018 FINAL   Final  . Organism ID, Bacteria 12/25/2018 STAPHYLOCOCCUS AUREUS   Final  . Specimen Description 12/25/2018    Final                   Value:BLOOD LEFT HAND Performed at [facility], [place] [address], [place], [place] [address]   . Special Requests 12/25/2018    Final                   Value:BOTTLES DRAWN AEROBIC AND ANAEROBIC Blood Culture results may not be optimal due to an inadequate volume of blood received in culture bottles Performed at [facility], [place] [address] [place], [place] [address]   . Culture  Setup Time 12/25/2018    Final                   Value:GRAM POSITIVE COCCI IN CLUSTERS IN BOTH AEROBIC AND ANAEROBIC BOTTLES CRITICAL VALUE NOTED.  VALUE IS CONSISTENT WITH PREVIOUSLY REPORTED AND CALLED VALUE.   . Culture 12/25/2018 *  Final                   Value:STAPHYLOCOCCUS AUREUS SUSCEPTIBILITIES PERFORMED ON PREVIOUS CULTURE WITHIN THE LAST 5 DAYS. Performed at [facility], [place] [address], [place], [place] [address]   . Report Status 12/25/2018 12/28/2018 FINAL   Final  . Sodium 12/25/2018 136  135 - 145  mmol/L Final  . Potassium 12/25/2018 3.6  3.5 - 5.1 mmol/L Final  . Chloride 12/25/2018 92* 98 - 111 mmol/L Final  . CO2 12/25/2018 33* 22 - 32 mmol/L Final  . Glucose, Bld 12/25/2018 159* 70 - 99 mg/dL Final  . BUN 12/25/2018 12  6 - 20 mg/dL Final  . Creatinine, Ser 12/25/2018 0.40* 0.44 - 1.00 mg/dL Final  . Calcium 12/25/2018 7.0* 8.9 - 10.3 mg/dL Final  . Total Protein 12/25/2018 7.0  6.5 - 8.1 g/dL Final  . Albumin 12/25/2018 2.3* 3.5 - 5.0 g/dL Final  . AST 12/25/2018 24  15 - 41 U/L Final  . ALT 12/25/2018 35  0 - 44 U/L Final  . Alkaline Phosphatase 12/25/2018 235* 38 - 126 U/L Final  . Total Bilirubin 12/25/2018 0.6  0.3 - 1.2 mg/dL Final  . GFR calc non Af Amer 12/25/2018 >60  >60 mL/min Final  . GFR calc Af Amer 12/25/2018 >60  >60 mL/min Final  . Anion gap 12/25/2018 11  5 - 15 Final   Performed at [facility], [place] [address] [place], [place] [address]  . D-Dimer, Quant 12/25/2018 7.92* 0.00 - 0.50 ug/mL-FEU Final   Comment: (NOTE) At the manufacturer cut-off of 0.50 ug/mL FEU, this assay has been documented to exclude PE with a sensitivity and negative predictive value of 97 to 99%.  At this time, this assay has not been approved by the FDA to exclude DVT/VTE. Results should be correlated with clinical presentation.  Performed at [facility], [place] [address], [place], [place] [address]   . Procalcitonin 12/25/2018 <0.10  ng/mL Final   Comment:        Interpretation: PCT (Procalcitonin) <= 0.5 ng/mL: Systemic infection (sepsis) is not likely. Local bacterial infection is possible. (NOTE)       Sepsis PCT Algorithm           Lower Respiratory Tract                                      Infection PCT Algorithm    ----------------------------     ----------------------------         PCT < 0.25 ng/mL                PCT < 0.10 ng/mL         Strongly encourage             Strongly discourage   discontinuation of antibiotics     initiation of antibiotics    ----------------------------     -----------------------------       PCT 0.25 - 0.50 ng/mL            PCT 0.10 - 0.25 ng/mL               OR       >80% decrease in PCT            Discourage initiation of                                            antibiotics      Encourage discontinuation           of antibiotics    ----------------------------     -----------------------------         PCT >= 0.50 ng/mL              PCT 0.26 - 0.50 ng/mL               AND                                 <80% decrease in PCT             Encourage initiation of                                             antibiotics       Encourage continuation           of antibiotics    ----------------------------     -----------------------------        PCT >= 0.50 ng/mL                  PCT > 0.50 ng/mL               AND         increase in PCT                  Strongly encourage  initiation of antibiotics    Strongly encourage escalation           of antibiotics                                     -----------------------------                                           PCT <= 0.25 ng/mL                                                 OR                                        > 80% decrease in PCT                                     Discontinue / Do not initiate                                             antibiotics Performed at [facility] [address] [place], [place] [address]   . LDH 12/25/2018 244* 98 - 192 U/L Final   Performed at [facility] [address] [place], [place] [address]  . Ferritin 12/25/2018 2,306* 11 - 307 ng/mL Final   Performed at [facility] [address], [place], [place] [address]  . Triglycerides 12/25/2018 108  <150 mg/dL Final   Performed at [facility], [place] [address], [place], [place] [address]  . Fibrinogen 12/25/2018 >800*  210 - 475 mg/dL Final   Performed at [facility] [address] [place], [place] [address]  . CRP 12/25/2018 19.6* <1.0 mg/dL Final   Performed at [facility] [address], [place], [place] [address]  . I-stat hCG, quantitative 12/25/2018 <5.0  <5 mIU/mL Final  . Comment 3 12/25/2018          Final   Comment:   GEST. AGE      CONC.  (mIU/mL)   <=1 WEEK        5 - 50     2 WEEKS       50 - 500     3 WEEKS       100 - 10,000     4 WEEKS     1,000 - 30,000        FEMALE AND NON-PREGNANT FEMALE:     LESS THAN 5 mIU/mL   . SARS Coronavirus 2 12/25/2018 NEGATIVE  NEGATIVE Final   Comment: (NOTE) If result is NEGATIVE SARS-CoV-2 target nucleic acids are NOT DETECTED. The SARS-CoV-2 RNA is generally detectable in upper and lower  respiratory specimens during the acute phase of infection. The lowest  concentration of SARS-CoV-2 viral copies this assay can detect is 250  copies / mL.  A negative result does not preclude SARS-CoV-2 infection  and should not be used as the sole basis for treatment or other  patient management decisions.  A negative result may occur with  improper specimen collection / handling, submission of specimen other  than nasopharyngeal swab, presence of viral mutation(s) within the  areas targeted by this assay, and inadequate number of viral copies  (<250 copies / mL). A negative result must be combined with clinical  observations, patient history, and epidemiological information. If result is POSITIVE SARS-CoV-2 target nucleic acids are DETECTED. The SARS-CoV-2 RNA is generally detectable in upper and lower  respiratory specimens dur                          ing the acute phase of infection.  Positive  results are indicative of active infection with SARS-CoV-2.  Clinical  correlation with patient history and other diagnostic information is  necessary to determine patient infection status.  Positive results do  not rule out  bacterial infection or co-infection with other viruses. If result is PRESUMPTIVE POSTIVE SARS-CoV-2 nucleic acids MAY BE PRESENT.   A presumptive positive result was obtained on the submitted specimen  and confirmed on repeat testing.  While 2019 novel coronavirus  (SARS-CoV-2) nucleic acids may be present in the submitted sample  additional confirmatory testing may be necessary for epidemiological  and / or clinical management purposes  to differentiate between  SARS-CoV-2 and other Sarbecovirus currently known to infect humans.  If clinically indicated additional testing with an alternate test  methodology [phone]) is advised. The SARS-CoV-2 RNA is generally  detectable in upper and lower respiratory sp                          ecimens during the acute  phase of infection. The expected result is Negative. Fact Sheet for Patients:  StrictlyIdeas.no Fact Sheet for Healthcare Providers: BankingDealers.co.za This test is not yet approved or cleared by the [place] FDA and has been authorized for detection and/or diagnosis of SARS-CoV-2 by FDA under an Emergency Use Authorization (EUA).  This EUA will remain in effect (meaning this test can be used) for the duration of the COVID-19 declaration under Section 564(b)(1) of the Act, 21 U.S.C. section 360bbb-3(b)(1), unless the authorization is terminated or revoked sooner. Performed at [facility], [place] [address], [place], [place] [address]   . Sodium 12/26/2018 139  135 - 145 mmol/L Final  . Potassium 12/26/2018 3.2* 3.5 - 5.1 mmol/L Final  . Chloride 12/26/2018 96* 98 - 111 mmol/L Final  . CO2 12/26/2018 35* 22 - 32 mmol/L Final  . Glucose, Bld 12/26/2018 172* 70 - 99 mg/dL Final  . BUN 12/26/2018 13  6 - 20 mg/dL Final  . Creatinine, Ser 12/26/2018 0.44  0.44 - 1.00 mg/dL Final  . Calcium 12/26/2018 6.1* 8.9 - 10.3 mg/dL Final   Comment: CRITICAL RESULT CALLED TO,  READ BACK BY AND VERIFIED WITH: CHEEK,K RN '@[id number]'  ON 12/26/2018 JACKSON,K   . GFR calc non Af Amer 12/26/2018 >60  >60 mL/min Final  . GFR calc Af Amer 12/26/2018 >60  >60 mL/min Final  . Anion gap 12/26/2018 8  5 - 15 Final   Performed at [facility], [place] [address], [place], [place] [address]  . WBC 12/26/2018 6.6  4.0 - 10.5 K/uL Final  . RBC 12/26/2018 2.62* 3.87 - 5.11 MIL/uL Final  .  Hemoglobin 12/26/2018 8.2* 12.0 - 15.0 g/dL Final  . HCT 12/26/2018 27.8* 36.0 - 46.0 % Final  . MCV 12/26/2018 106.1* 80.0 - 100.0 fL Final  . MCH 12/26/2018 31.3  26.0 - 34.0 pg Final  . MCHC 12/26/2018 29.5* 30.0 - 36.0 g/dL Final  . RDW 12/26/2018 19.7* 11.5 - 15.5 % Final  . Platelets 12/26/2018 226  150 - 400 K/uL Final  . nRBC 12/26/2018 2.9* 0.0 - 0.2 % Final   Performed at [facility], [place] [address], [place], [place] [address]  . MRSA by PCR 12/25/2018 NEGATIVE  NEGATIVE Final   Comment:        The GeneXpert MRSA Assay (FDA approved for NASAL specimens only), is one component of a comprehensive MRSA colonization surveillance program. It is not intended to diagnose MRSA infection nor to guide or monitor treatment for MRSA infections. Performed at [facility], [place] [address] [place], [place] [address]   . Enterococcus species 12/25/2018 NOT DETECTED  NOT DETECTED Final  . Listeria monocytogenes 12/25/2018 NOT DETECTED  NOT DETECTED Final  . Staphylococcus species 12/25/2018 DETECTED* NOT DETECTED Final   Comment: CRITICAL RESULT CALLED TO, READ BACK BY AND VERIFIED WITH: Audrea Muscat [id number] 12/26/2018 T. TYSOR   . Staphylococcus aureus (BCID) 12/25/2018 DETECTED* NOT DETECTED Final   Comment: Methicillin (oxacillin) susceptible Staphylococcus aureus (MSSA). Preferred therapy is anti staphylococcal beta lactam antibiotic (Cefazolin or Nafcillin), unless clinically contraindicated. CRITICAL RESULT CALLED TO, READ BACK BY AND  VERIFIED WITH: Audrea Muscat [id number] 12/26/2018 T. TYSOR   . Methicillin resistance 12/25/2018 NOT DETECTED  NOT DETECTED Final  . Streptococcus species 12/25/2018 NOT DETECTED  NOT DETECTED Final  . Streptococcus agalactiae 12/25/2018 NOT DETECTED  NOT DETECTED Final  . Streptococcus pneumoniae 12/25/2018 NOT DETECTED  NOT DETECTED Final  . Streptococcus pyogenes 12/25/2018 NOT DETECTED  NOT DETECTED Final  . Acinetobacter baumannii 12/25/2018 NOT DETECTED  NOT DETECTED Final  . Enterobacteriaceae species 12/25/2018 NOT DETECTED  NOT DETECTED Final  . Enterobacter cloacae complex 12/25/2018 NOT DETECTED  NOT DETECTED Final  . Escherichia coli 12/25/2018 NOT DETECTED  NOT DETECTED Final  . Klebsiella oxytoca 12/25/2018 NOT DETECTED  NOT DETECTED Final  . Klebsiella pneumoniae 12/25/2018 NOT DETECTED  NOT DETECTED Final  . Proteus species 12/25/2018 NOT DETECTED  NOT DETECTED Final  . Serratia marcescens 12/25/2018 NOT DETECTED  NOT DETECTED Final  . Haemophilus influenzae 12/25/2018 NOT DETECTED  NOT DETECTED Final  . Neisseria meningitidis 12/25/2018 NOT DETECTED  NOT DETECTED Final  . Pseudomonas aeruginosa 12/25/2018 NOT DETECTED  NOT DETECTED Final  . Candida albicans 12/25/2018 NOT DETECTED  NOT DETECTED Final  . Candida glabrata 12/25/2018 NOT DETECTED  NOT DETECTED Final  . Candida krusei 12/25/2018 NOT DETECTED  NOT DETECTED Final  . Candida parapsilosis 12/25/2018 NOT DETECTED  NOT DETECTED Final  . Candida tropicalis 12/25/2018 NOT DETECTED  NOT DETECTED Final   Performed at [facility], [address], [place], [place] [address]  . Weight 12/26/2018 3,086.44  oz Final  . Height 12/26/2018 63  in Final  . BP 12/26/2018 152/58  mmHg Final  . Specimen Description 12/26/2018    Final                   Value:BLOOD RIGHT ANTECUBITAL Performed at [facility], [place] [address] [place], [place] [address]   . Special Requests 12/26/2018    Final  Value:BOTTLES DRAWN AEROBIC ONLY Blood Culture adequate volume Performed at [place] [address], [place], [place] [address]   . Culture 12/26/2018    Final                   Value:NO GROWTH 2 DAYS Performed at [facility], [place] [address] [place], [place] [address]   . Report Status 12/26/2018 PENDING   Incomplete  . Specimen Description 12/26/2018    Final                   Value:BLOOD RIGHT HAND Performed at [facility], [place] [address] [place], [place] [address]   . Special Requests 12/26/2018    Final                   Value:BOTTLES DRAWN AEROBIC ONLY Blood Culture adequate volume Performed at [facility], [place] [address] [place], [place] [address]   . Culture 12/26/2018    Final                   Value:NO GROWTH 2 DAYS Performed at [facility], [place] [address], [place], [place] [address]   . Report Status 12/26/2018 PENDING   Incomplete  . Sodium 12/27/2018 139  135 - 145 mmol/L Final  . Potassium 12/27/2018 4.1  3.5 - 5.1 mmol/L Final   Comment: DELTA CHECK NOTED NO VISIBLE HEMOLYSIS   . Chloride 12/27/2018 96* 98 - 111 mmol/L Final  . CO2 12/27/2018 34* 22 - 32 mmol/L Final  . Glucose, Bld 12/27/2018 173* 70 - 99 mg/dL Final  . BUN 12/27/2018 17  6 - 20 mg/dL Final  . Creatinine, Ser 12/27/2018 0.46  0.44 - 1.00 mg/dL Final  . Calcium 12/27/2018 7.0* 8.9 - 10.3 mg/dL Final  . GFR calc non Af Amer 12/27/2018 >60  >60 mL/min Final  . GFR calc Af Amer 12/27/2018 >60  >60 mL/min Final  . Anion gap 12/27/2018 9  5 - 15 Final   Performed at [facility], [place] [address] [place], [place] [address]  . WBC 12/27/2018 8.1  4.0 - 10.5 K/uL Final  . RBC 12/27/2018 2.77* 3.87 - 5.11 MIL/uL Final  . Hemoglobin 12/27/2018 8.8* 12.0 - 15.0 g/dL Final  . HCT 12/27/2018 29.6* 36.0 - 46.0 % Final  . MCV 12/27/2018 106.9* 80.0 - 100.0 fL Final  . MCH 12/27/2018 31.8  26.0 - 34.0 pg  Final  . MCHC 12/27/2018 29.7* 30.0 - 36.0 g/dL Final  . RDW 12/27/2018 19.9* 11.5 - 15.5 % Final  . Platelets 12/27/2018 282  150 - 400 K/uL Final  . nRBC 12/27/2018 3.2* 0.0 - 0.2 % Final   Performed at [facility], [place] [address], [place],  [address]    (this displays the last labs from the last 3 days)  No results found for: TOTALPROTELP, ALBUMINELP, A1GS, A2GS, BETS, BETA2SER, GAMS, MSPIKE, SPEI (this displays SPEP labs)  No results found for: KPAFRELGTCHN, LAMBDASER, KAPLAMBRATIO (kappa/lambda light chains)  No results found for: HGBA, HGBA2QUANT, HGBFQUANT, HGBSQUAN (Hemoglobinopathy evaluation)   Lab Results  Component Value Date   LDH 244 (H) 12/25/2018    Lab Results  Component Value Date   IRON 137 10/26/2018   TIBC 384 10/26/2018   IRONPCTSAT 36 (H) 10/26/2018   (Iron and TIBC)  Lab Results  Component Value Date   FERRITIN 2,306 (H) 12/25/2018  Urinalysis    Component Value Date/Time   COLORURINE YELLOW 10/27/2018 2346   APPEARANCEUR HAZY (A) 10/27/2018 2346   LABSPEC 1.038 (H) 10/27/2018 2346   PHURINE 5.0 10/27/2018 2346   GLUCOSEU NEGATIVE 10/27/2018 2346   HGBUR NEGATIVE 10/27/2018 2346   BILIRUBINUR NEGATIVE 10/27/2018 2346   KETONESUR 5 (A) 10/27/2018 2346   PROTEINUR NEGATIVE 10/27/2018 2346   NITRITE NEGATIVE 10/27/2018 2346   LEUKOCYTESUR NEGATIVE 10/27/2018 2346     STUDIES:     ELIGIBLE FOR AVAILABLE RESEARCH PROTOCOL: no   ASSESSMENT: 58 y.o. [place], [place] woman presenting 10/26/2018 with right-sided inflammatory breast cancer, stage IV, involvnh lungs, lymph nodes and bones, as follows:  (a) chest CT scan 10/26/2018 shows bilateral pleural effusions, possible lymphangitic spread of tumor, diffuse bony metastatic disease, and significant axillary mediastinal and hilar adenopathy  (b) bone scan 10/27/2018 is a "near [place]" consistent with widespread bony metastatic disease  (c) head CT  with and without contrast 10/30/2018 shows no intracranial metastatic disease, multiple calvarial lesions  (d) CA-27-29 on 10/27/2018 was 1810.4  (1) pleural fluid from right thoracentesis 10/28/2018 confirms malignant cells consistent with a breast primary, strongly estrogen and progesterone receptor positive, HER-2 not amplified, with an MIB-1 of 2%  (2) anastrozole started 10/29/2018  (a) palbociclib to start 11/13/2018  (b) denosumab/xgeva to start 11/13/2018  (3) associated problems:  (a) hypoxia secondary to effusions  (b) pain from bone lesions  (c) right upper extremity lymphedema  (d) anemia, requiring transfusion  (e) poor venous access  (4) genetics testing pending    PLAN:  Etty is   A total of (50) minutes of face-to-face time was spent with this patient with greater than 50% of that time in counseling and care-coordination.    Wilber Bihari, NP  12/29/18 9:23 AM Medical Oncology and Hematology [facility] [address] [place], [place] [address] Tel. [phone]    Fax. [phone]

## 2018-12-29 NOTE — Progress Notes (Addendum)
ID PROGRESS NOTE  58yo f with metastatic breast ca, sacral wounds, found to have MSSA bacteremia not associated with portacath  Afebrile Repeat blood cx negative TTE no signs of vegetation Baseline shortness of breath from pulmonary involvement is improved  BP (!) 143/71 (BP Location: Left Leg)   Pulse 97   Temp 98.3 F (36.8 C)   Resp 20   Ht 5\' 3"  (1.6 m)   Wt 88.9 kg   LMP 05/28/2013 (Within Months)   SpO2 98%   BMI 34.72 kg/m  Physical Exam  Constitutional:  oriented to person, place, and time. appears well-developed and well-nourished. No distress.  HENT: [place]/AT, PERRLA, no scleral icterus Mouth/Throat: Oropharynx is clear and moist. No oropharyngeal exudate.  Cardiovascular: Normal rate, regular rhythm and normal heart sounds. Exam reveals no gallop and no friction rub.  No murmur heard.  Pulmonary/Chest: Effort normal and breath sounds normal. No respiratory distress.  has no wheezes.  Neck = supple, no nuchal rigidity Ext: right arm in sling 2/2 fracture Lymphadenopathy: no cervical adenopathy. No axillary adenopathy Neurological: alert and oriented to person, place, and time.  Skin: Skin is warm and dry. No rash noted. No erythema.  Psychiatric: a normal mood and affect.  behavior is normal.    Micro: 7/31 blood cx ngtd 7/30 blood cx x 2 MSSA   A/P: mssa bacteremia = will treat as complicated bacteremia with 4 wk of IV cefazolin, can use 7/31 as day 1. Will need weekly labs. No signs of decreased EF on TTE thus no need for follow up on echo.   - will need weekly cbc, bmp, and picc line care  Pressure ulcers/sacral wounds = thought to be source of infection, continue with dressing changes per [facility]  We will see back in the ID clinic in 4 wk

## 2018-12-30 ENCOUNTER — Encounter: Payer: Self-pay | Admitting: General Practice

## 2018-12-30 ENCOUNTER — Inpatient Hospital Stay: Payer: Medicaid Other

## 2018-12-30 ENCOUNTER — Telehealth: Payer: Self-pay

## 2018-12-30 ENCOUNTER — Inpatient Hospital Stay: Payer: Self-pay

## 2018-12-30 MED ORDER — ALBUTEROL SULFATE (2.5 MG/3ML) 0.083% IN NEBU
2.5000 mg | INHALATION_SOLUTION | Freq: Four times a day (QID) | RESPIRATORY_TRACT | Status: DC | PRN
  Administered 2018-12-30 – 2019-01-02 (×2): 2.5 mg via RESPIRATORY_TRACT
  Filled 2018-12-30: qty 3

## 2018-12-30 MED ORDER — SODIUM CHLORIDE 0.9% FLUSH
10.0000 mL | INTRAVENOUS | Status: DC | PRN
  Administered 2019-01-16 – 2019-01-18 (×2): 10 mL
  Filled 2018-12-30 (×2): qty 40

## 2018-12-30 MED ORDER — ALBUTEROL SULFATE (2.5 MG/3ML) 0.083% IN NEBU
INHALATION_SOLUTION | RESPIRATORY_TRACT | Status: AC
  Filled 2018-12-30: qty 3

## 2018-12-30 NOTE — Telephone Encounter (Signed)
Nutrition  Patient on schedule today for phone visit with RD.  Daughter called to say that patient is in the hospital and would not be able to complete phone visit today.  Daughter said that she will call RD back at a later date to reschedule.    Noted inpatient RD currently following.   Sherelle Castelli B. Zenia Resides, [place], [place] Registered Dietitian [phone] (pager)

## 2018-12-30 NOTE — Progress Notes (Signed)
PROGRESS NOTE    Suzanne [name]  YHC:[id number] DOB: Oct 17, 1960 DOA: 12/25/2018 PCP: Patient, No Pcp Per    Brief Narrative:  58 y.o. 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  Patient was sitting bedside commode and she was not able to stand up this morning and she developed worsening shortness of breath.  She denies chest pain, cough, sore throat.  Her thought that patient has been tired because of multiple appointments. Patient appears mildly confused.  She is still having some shortness of breath.  She denies chest pain, abdominal pain.  Evaluation in the ED; patient was noted to be febrile temperature 100, tachycardic.  Labs sodium 136, potassium 3.6, CO2 33, BUN 12 calcium 7.0, alkaline phosphatase 235, albumin 2.3, LDH 244, ferritin 2306, CRP 19, procalcitonin less than 0.10, d-dimer 7.9, fibrinogen more than 800, white blood cell 8.1, hemoglobin 9.6, platelets 214.  BNP 89.  X-ray:Ventilation appears not significantly changed from the June CT where bilateral pleural effusions and nonspecific lung base airspace opacity was noted (please see that report). Diffuse osseous metastatic disease, including pathologic fracture of the right clavicle.  CT angio; Negative for pulmonary embolus or acute disease. Small bilateral pleural effusions. Effusion on the left has decreased since the prior CT. Bandlike opacities in both lungs may be due to scar or fibrosis, unchanged.     Assessment & Plan:   Active Problems:   Bone metastasis (HCC)   Pleural effusion, bilateral   Carcinoma of breast,  estrogen receptor positive, stage 4, right (HCC)   Acute on chronic respiratory failure with hypoxia (HCC)   Pneumonia   Respiratory failure, acute ([place])  1-Acute on chronic hypoxic respiratory failure: Probably related to pneumonia. Patient present with fever, worsening hypoxemia.  CT chest was negative for PE though showed chronic fibrosis.  Unable to rule out superimposed infection on CT.  Bilateral pleural effusion appears to be a small. -SARS coronavirus test neg x 2 -On scheduled albuterol. -Now on IV Solu-Medrol 60mg  IV q12hrs. Will decrease to 40mg  IV daily -Weaned off bipap, remains comfortable on minimal O2 support -Blood cx pos for MSSA. Cont plan per below  2-Stage IV metastatic breast cancer to bone and lung, pleural effusion; -Patient currently undergoing radiation.  -She has a pathological proximal left humeral diaphysis fracture; pain management for now.  Pt is supposed to follow with Ortho as an outpatient. -Had been seen earlier by Oncology, stable  3-Anemia: of Malignancy -Patient reports that she received a blood transfusion the day prior to admission. -Hemodynamically stable at this time -hgb remains stable, labs were reviewed  4-elevated d-dimer:  -CT angios negative for PE. -LE dopplers reviewed, neg for DVT -Stable currently. Possible elevated d-dimer related to malignancy  5-elevated alkaline phosphatase:  -Likely related to metastatic bone disease. -remains stable currently  6-Chronic diastolic Heart failure;  Given dose of IV lasix at time of presentation Of note BNP normal on presentation Currently stable  7-MSSA bacteremia -Currently afebrile -Remains with no leukocytosis -Reviewed 2d echo, unremarkable -ID recs noted. Repeat blood cx are neg x2. Per ID plan for total 4 weeks of abx.  -PICC placed 8/4  8-L and  R buttock pressure injury, present on admission, stage 2 -Noted at time of presentation -Have consulted [place], appreciate input   DVT prophylaxis: Lovenox subQ Code Status: Full Family Communication: Pt in room, family not at bedside Disposition Plan: SNF, pending bed  Consultants:   Oncology  ID  Procedures:   LE dopplers 7/31, neg for dvt  2d echo 7/31  Antimicrobials: Anti-infectives (From admission, onward)   Start     Dose/Rate Route Frequency Ordered Stop   12/26/18 0600  ceFAZolin (ANCEF) IVPB 2g/100 mL premix     2 g 200 mL/hr over 30 Minutes Intravenous Every 8 hours 12/26/18 0445     12/25/18 2300  vancomycin (VANCOCIN) IVPB 1000 mg/200 mL premix  Status:  Discontinued     1,000 mg 200 mL/hr over 60 Minutes Intravenous Every 12 hours 12/25/18 1101 12/26/18 0444   12/25/18 2200  ceFEPIme (MAXIPIME) 2 g in sodium chloride 0.9 % 100 mL IVPB  Status:  Discontinued     2 g 200 mL/hr over 30 Minutes Intravenous Every 8 hours 12/25/18 1447 12/26/18 0444   12/25/18 1515  ceFEPIme (MAXIPIME) 1 g in sodium chloride 0.9 % 100 mL IVPB     1 g 200 mL/hr over 30 Minutes Intravenous  Once 12/25/18 1446 12/25/18 1728   12/25/18 1115  vancomycin (VANCOCIN) 2,000 mg in sodium chloride 0.9 % 500 mL IVPB     2,000 mg 250 mL/hr over 120 Minutes Intravenous  Once 12/25/18 1101 12/25/18 1405   12/25/18 1045  ceFEPIme (MAXIPIME) 1 g in sodium chloride 0.9 % 100 mL IVPB     1 g 200 mL/hr over 30 Minutes Intravenous  Once 12/25/18 1022 12/25/18 1207      Subjective: No complaints this AM  Objective: Vitals:   12/29/18 1959 12/29/18 2042 12/30/18 0539 12/30/18 1404  BP: 139/73  132/68 (!) 143/71  Pulse: 86  91 97  Resp: 17  17 20   Temp: 98.1 F (36.7 C)  97.6 F (36.4 C) 98.3 F (36.8 C)  TempSrc:      SpO2: 100% 98% 100% 98%  Weight:      Height:        Intake/Output Summary (Last 24 hours) at 12/30/2018 1707 Last data filed at 12/30/2018 1521 Gross per 24 hour  Intake 1255 ml  Output 1300 ml  Net -45 ml   Filed Weights   12/27/18 0316 12/28/18 0629 12/29/18 0518  Weight: 90.4 kg 88.2 kg 88.9  kg    Examination: General exam: Conversant, in no acute distress Respiratory system: normal chest rise, clear, no audible wheezing Cardiovascular system: regular rhythm, s1-s2 Gastrointestinal system: Nondistended, nontender, pos BS Central nervous system: No seizures, no tremors Extremities: No cyanosis, no joint deformities Skin: No rashes, no pallor, stage 2 pressure sores Psychiatry: Affect normal // no auditory hallucinations   Data Reviewed: I have personally reviewed following labs and imaging studies  CBC: Recent Labs  Lab 12/25/18 1009 12/26/18 0235 12/27/18 0213  WBC 8.1 6.6 8.1  NEUTROABS 5.0  --   --   HGB 9.6* 8.2* 8.8*  HCT 31.6* 27.8* 29.6*  MCV 104.6* 106.1* 106.9*  PLT 214 226 086   Basic Metabolic Panel: Recent Labs  Lab 12/25/18 1009 12/26/18 0235 12/27/18 0213  NA 136 139 139  K 3.6 3.2* 4.1  CL 92* 96* 96*  CO2 33* 35* 34*  GLUCOSE 159* 172* 173*  BUN 12 13 17   CREATININE 0.40* 0.44 0.46  CALCIUM 7.0* 6.1* 7.0*  GFR: Estimated Creatinine Clearance: 80.1 mL/min (by C-G formula based on SCr of 0.46 mg/dL). Liver Function Tests: Recent Labs  Lab 12/25/18 1009  AST 24  ALT 35  ALKPHOS 235*  BILITOT 0.6  PROT 7.0  ALBUMIN 2.3*   No results for input(s): LIPASE, AMYLASE in the last 168 hours. No results for input(s): AMMONIA in the last 168 hours. Coagulation Profile: No results for input(s): INR, PROTIME in the last 168 hours. Cardiac Enzymes: No results for input(s): CKTOTAL, CKMB, CKMBINDEX, TROPONINI in the last 168 hours. BNP (last 3 results) No results for input(s): PROBNP in the last 8760 hours. HbA1C: No results for input(s): HGBA1C in the last 72 hours. CBG: No results for input(s): GLUCAP in the last 168 hours. Lipid Profile: No results for input(s): CHOL, HDL, LDLCALC, TRIG, CHOLHDL, LDLDIRECT in the last 72 hours. Thyroid Function Tests: No results for input(s): TSH, T4TOTAL, FREET4, T3FREE, THYROIDAB in the last 72  hours. Anemia Panel: No results for input(s): VITAMINB12, FOLATE, FERRITIN, TIBC, IRON, RETICCTPCT in the last 72 hours. Sepsis Labs: Recent Labs  Lab 12/25/18 1009  PROCALCITON <0.10  LATICACIDVEN 1.0    Recent Results (from the past 240 hour(s))  SARS Coronavirus 2 [facility] order, Performed in [facility] hospital lab)     Status: None   Collection Time: 12/25/18 10:09 AM   Specimen: Nasopharyngeal Swab  Result Value Ref Range Status   SARS Coronavirus 2 NEGATIVE NEGATIVE Final    Comment: (NOTE) If result is NEGATIVE SARS-CoV-2 target nucleic acids are NOT DETECTED. The SARS-CoV-2 RNA is generally detectable in upper and lower  respiratory specimens during the acute phase of infection. The lowest  concentration of SARS-CoV-2 viral copies this assay can detect is 250  copies / mL. A negative result does not preclude SARS-CoV-2 infection  and should not be used as the sole basis for treatment or other  patient management decisions.  A negative result may occur with  improper specimen collection / handling, submission of specimen other  than nasopharyngeal swab, presence of viral mutation(s) within the  areas targeted by this assay, and inadequate number of viral copies  (<250 copies / mL). A negative result must be combined with clinical  observations, patient history, and epidemiological information. If result is POSITIVE SARS-CoV-2 target nucleic acids are DETECTED. The SARS-CoV-2 RNA is generally detectable in upper and lower  respiratory specimens dur ing the acute phase of infection.  Positive  results are indicative of active infection with SARS-CoV-2.  Clinical  correlation with patient history and other diagnostic information is  necessary to determine patient infection status.  Positive results do  not rule out bacterial infection or co-infection with other viruses. If result is PRESUMPTIVE POSTIVE SARS-CoV-2 nucleic acids MAY BE PRESENT.   A presumptive positive  result was obtained on the submitted specimen  and confirmed on repeat testing.  While 2019 novel coronavirus  (SARS-CoV-2) nucleic acids may be present in the submitted sample  additional confirmatory testing may be necessary for epidemiological  and / or clinical management purposes  to differentiate between  SARS-CoV-2 and other Sarbecovirus currently known to infect humans.  If clinically indicated additional testing with an alternate test  methodology [phone]) is advised. The SARS-CoV-2 RNA is generally  detectable in upper and lower respiratory sp ecimens during the acute  phase of infection. The expected result is Negative. Fact Sheet for Patients:  StrictlyIdeas.no Fact Sheet for Healthcare Providers: BankingDealers.co.za This test is not yet approved or cleared by  the [employer] and has been authorized for detection and/or diagnosis of SARS-CoV-2 by FDA under an Emergency Use Authorization (EUA).  This EUA will remain in effect (meaning this test can be used) for the duration of the COVID-19 declaration under Section 564(b)(1) of the Act, 21 U.S.C. section 360bbb-3(b)(1), unless the authorization is terminated or revoked sooner. Performed at [facility], [place] [address], [place], [place] [address]   Blood Culture (routine x 2)     Status: Abnormal   Collection Time: 12/25/18 10:09 AM   Specimen: BLOOD  Result Value Ref Range Status   Specimen Description   Final    BLOOD RIGHT ANTECUBITAL Performed at [facility] [address] [place],  [place] [address]    Special Requests   Final    BOTTLES DRAWN AEROBIC AND ANAEROBIC Blood Culture adequate volume Performed at [facility] [address], [place], [place] [address]    Culture  Setup Time   Final    GRAM POSITIVE COCCI IN CLUSTERS IN BOTH AEROBIC AND ANAEROBIC BOTTLES CRITICAL RESULT CALLED TO, READ BACK  BY AND VERIFIED WITH: Audrea Muscat [id number] 12/26/2018 Mena Goes Performed at [facility], [place] [address] [place], [place] [address]    Culture STAPHYLOCOCCUS AUREUS (A)  Final   Report Status 12/28/2018 FINAL  Final   Organism ID, Bacteria STAPHYLOCOCCUS AUREUS  Final      Susceptibility   Staphylococcus aureus - MIC*    CIPROFLOXACIN <=0.5 SENSITIVE Sensitive     ERYTHROMYCIN 1 INTERMEDIATE Intermediate     GENTAMICIN <=0.5 SENSITIVE Sensitive     OXACILLIN <=0.25 SENSITIVE Sensitive     TETRACYCLINE <=1 SENSITIVE Sensitive     VANCOMYCIN <=0.5 SENSITIVE Sensitive     TRIMETH/SULFA <=10 SENSITIVE Sensitive     CLINDAMYCIN RESISTANT Resistant     RIFAMPIN <=0.5 SENSITIVE Sensitive     Inducible Clindamycin POSITIVE Resistant     * STAPHYLOCOCCUS AUREUS  Blood Culture ID Panel (Reflexed)     Status: Abnormal   Collection Time: 12/25/18 10:09 AM  Result Value Ref Range Status   Enterococcus species NOT DETECTED NOT DETECTED Final   Listeria monocytogenes NOT DETECTED NOT DETECTED Final   Staphylococcus species DETECTED (A) NOT DETECTED Final    Comment: CRITICAL RESULT CALLED TO, READ BACK BY AND VERIFIED WITH: J. GRIMSLEY,PHARMD [id number] 12/26/2018 T. TYSOR    Staphylococcus aureus (BCID) DETECTED (A) NOT DETECTED Final    Comment: Methicillin (oxacillin) susceptible Staphylococcus aureus (MSSA). Preferred therapy is anti staphylococcal beta lactam antibiotic (Cefazolin or Nafcillin), unless clinically contraindicated. CRITICAL RESULT CALLED TO, READ BACK BY AND VERIFIED WITH: J. Fabiola Backer [id number] 12/26/2018 T. TYSOR    Methicillin resistance NOT DETECTED NOT DETECTED Final   Streptococcus species NOT DETECTED NOT DETECTED Final   Streptococcus agalactiae NOT DETECTED NOT DETECTED Final   Streptococcus pneumoniae NOT DETECTED NOT DETECTED Final   Streptococcus pyogenes NOT DETECTED NOT DETECTED Final   Acinetobacter baumannii NOT DETECTED NOT DETECTED Final    Enterobacteriaceae species NOT DETECTED NOT DETECTED Final   Enterobacter cloacae complex NOT DETECTED NOT DETECTED Final   Escherichia coli NOT DETECTED NOT DETECTED Final   Klebsiella oxytoca NOT DETECTED NOT DETECTED Final   Klebsiella pneumoniae NOT DETECTED NOT DETECTED Final   Proteus species NOT DETECTED NOT DETECTED Final   Serratia marcescens NOT DETECTED NOT DETECTED Final   Haemophilus influenzae NOT DETECTED NOT DETECTED Final   Neisseria meningitidis NOT DETECTED NOT DETECTED Final  Pseudomonas aeruginosa NOT DETECTED NOT DETECTED Final   Candida albicans NOT DETECTED NOT DETECTED Final   Candida glabrata NOT DETECTED NOT DETECTED Final   Candida krusei NOT DETECTED NOT DETECTED Final   Candida parapsilosis NOT DETECTED NOT DETECTED Final   Candida tropicalis NOT DETECTED NOT DETECTED Final    Comment: Performed at [facility], [place] [address] [place], [place] [address]  Blood Culture (routine x 2)     Status: Abnormal   Collection Time: 12/25/18 10:15 AM   Specimen: BLOOD LEFT HAND  Result Value Ref Range Status   Specimen Description   Final    BLOOD LEFT HAND Performed at [facility] [address] [place], [place] [address]    Special Requests   Final    BOTTLES DRAWN AEROBIC AND ANAEROBIC Blood Culture results may not be optimal due to an inadequate volume of blood received in culture bottles Performed at [facility] [address], [place], [place] [address]    Culture  Setup Time   Final    GRAM POSITIVE COCCI IN CLUSTERS IN BOTH AEROBIC AND ANAEROBIC BOTTLES CRITICAL VALUE NOTED.  VALUE IS CONSISTENT WITH PREVIOUSLY REPORTED AND CALLED VALUE.    Culture (A)  Final    STAPHYLOCOCCUS AUREUS SUSCEPTIBILITIES PERFORMED ON PREVIOUS CULTURE WITHIN THE LAST 5 DAYS. Performed at [facility], [place] [address] [place], [place] [address]    Report Status 12/28/2018 FINAL  Final  SARS Coronavirus 2 (CEPHEID -  Performed in [place] [facility] hospital lab), Hosp Order     Status: None   Collection Time: 12/25/18  2:44 PM   Specimen: Nasopharyngeal Swab  Result Value Ref Range Status   SARS Coronavirus 2 NEGATIVE NEGATIVE Final    Comment: (NOTE) If result is NEGATIVE SARS-CoV-2 target nucleic acids are NOT DETECTED. The SARS-CoV-2 RNA is generally detectable in upper and lower  respiratory specimens during the acute phase of infection. The lowest  concentration of SARS-CoV-2 viral copies this assay can detect is 250  copies / mL. A negative result does not preclude SARS-CoV-2 infection  and should not be used as the sole basis for treatment or other  patient management decisions.  A negative result may occur with  improper specimen collection / handling, submission of specimen other  than nasopharyngeal swab, presence of viral mutation(s) within the  areas targeted by this assay, and inadequate number of viral copies  (<250 copies / mL). A negative result must be combined with clinical  observations, patient history, and epidemiological information. If result is POSITIVE SARS-CoV-2 target nucleic acids are DETECTED. The SARS-CoV-2 RNA is generally detectable in upper and lower  respiratory specimens dur ing the acute phase of infection.  Positive  results are indicative of active infection with SARS-CoV-2.  Clinical  correlation with patient history and other diagnostic information is  necessary to determine patient infection status.  Positive results do  not rule out bacterial infection or co-infection with other viruses. If result is PRESUMPTIVE POSTIVE SARS-CoV-2 nucleic acids MAY BE PRESENT.   A presumptive positive result was obtained on the submitted specimen  and confirmed on repeat testing.  While 2019 novel coronavirus  (SARS-CoV-2) nucleic acids may be present in the submitted sample  additional confirmatory testing may be necessary for epidemiological  and / or clinical management  purposes  to differentiate between  SARS-CoV-2 and other Sarbecovirus currently known to infect humans.  If clinically indicated additional testing with an alternate  test  methodology [phone]) is advised. The SARS-CoV-2 RNA is generally  detectable in upper and lower respiratory sp ecimens during the acute  phase of infection. The expected result is Negative. Fact Sheet for Patients:  StrictlyIdeas.no Fact Sheet for Healthcare Providers: BankingDealers.co.za This test is not yet approved or cleared by the [place] FDA and has been authorized for detection and/or diagnosis of SARS-CoV-2 by FDA under an Emergency Use Authorization (EUA).  This EUA will remain in effect (meaning this test can be used) for the duration of the COVID-19 declaration under Section 564(b)(1) of the Act, 21 U.S.C. section 360bbb-3(b)(1), unless the authorization is terminated or revoked sooner. Performed at [facility], [place] [address] [place], [place] [address]   MRSA PCR Screening     Status: None   Collection Time: 12/25/18  5:16 PM   Specimen: Nasal Mucosa; Nasopharyngeal  Result Value Ref Range Status   MRSA by PCR NEGATIVE NEGATIVE Final    Comment:        The GeneXpert MRSA Assay (FDA approved for NASAL specimens only), is one component of a comprehensive MRSA colonization surveillance program. It is not intended to diagnose MRSA infection nor to guide or monitor treatment for MRSA infections. Performed at [facility], [place] [address], [place], [place] [address]   Culture, blood (routine x 2)     Status: None (Preliminary result)   Collection Time: 12/26/18  5:36 PM   Specimen: BLOOD  Result Value Ref Range Status   Specimen Description   Final    BLOOD RIGHT ANTECUBITAL Performed at [facility] [address], [place], [place] [address]    Special Requests   Final    BOTTLES  DRAWN AEROBIC ONLY Blood Culture adequate volume Performed at [facility] [address], [place], [place] [address]    Culture   Final    NO GROWTH 4 DAYS Performed at [facility], [place] [address], [place], [place] [address]    Report Status PENDING  Incomplete  Culture, blood (routine x 2)     Status: None (Preliminary result)   Collection Time: 12/26/18  5:36 PM   Specimen: BLOOD RIGHT HAND  Result Value Ref Range Status   Specimen Description   Final    BLOOD RIGHT HAND Performed at [facility] [address] [place], [place] [address]    Special Requests   Final    BOTTLES DRAWN AEROBIC ONLY Blood Culture adequate volume Performed at [facility] [address], [place], [place] [address]    Culture   Final    NO GROWTH 4 DAYS Performed at [facility], [place] [address] [place], [place] [address]    Report Status PENDING  Incomplete     Radiology Studies: [place] Ekg Site Rite  Result Date: 12/30/2018 If Site Rite image not attached, placement could not be confirmed due to current cardiac rhythm.   Scheduled Meds: . anastrozole  1 mg Oral Daily  . calcium-vitamin D  1 tablet Oral TID  . carvedilol  6.25 mg Oral BID WC  . chlorhexidine  15 mL Mouth Rinse BID  . Chlorhexidine Gluconate Cloth  6 each Topical Q0600  . docusate sodium  100 mg Oral BID  . enoxaparin (LOVENOX) injection  40 mg Subcutaneous Q24H  . feeding supplement (ENSURE ENLIVE)  237 mL Oral BID BM  . feeding supplement (PRO-STAT SUGAR FREE 64)  30 mL  Oral TID BM  . loratadine  10 mg Oral Daily  . mouth rinse  15 mL Mouth Rinse q12n4p  . methylPREDNISolone (SOLU-MEDROL) injection  40 mg Intravenous Daily  . multivitamin with minerals  1 tablet Oral Daily  . nystatin   Topical TID  . sodium chloride flush  3 mL Intravenous Q12H   Continuous Infusions: . sodium chloride    .  ceFAZolin (ANCEF) IV Stopped (12/30/18 1324)     LOS:  5 days   Marylu Lund, MD [facility] Pager On Amion  If 7PM-7AM, please contact night-coverage 12/30/2018, 5:07 PM

## 2018-12-30 NOTE — Progress Notes (Signed)
PHARMACY CONSULT NOTE FOR:  OUTPATIENT  PARENTERAL ANTIBIOTIC THERAPY (OPAT)  Indication: complicated MSSA bacteremia Regimen: Ancef 2g IV q8 hr End date: 01/22/19  IV antibiotic discharge orders are pended. To discharging provider:  please sign these orders via discharge navigator,  Select New Orders & click on the button choice - Manage This Unsigned Work.     Thank you for allowing pharmacy to be a part of this patient's care.  Suzanne [name] A 12/30/2018, 10:06 AM

## 2018-12-30 NOTE — Progress Notes (Signed)
Peripherally Inserted Central Catheter/Midline Placement  The IV Nurse has discussed with the patient and/or persons authorized to consent for the patient, the purpose of this procedure and the potential benefits and risks involved with this procedure.  The benefits include less needle sticks, lab draws from the catheter, and the patient may be discharged home with the catheter. Risks include, but not limited to, infection, bleeding, blood clot (thrombus formation), and puncture of an artery; nerve damage and irregular heartbeat and possibility to perform a PICC exchange if needed/ordered by physician.  Alternatives to this procedure were also discussed.  Bard Power PICC patient education guide, fact sheet on infection prevention and patient information card has been provided to patient /or left at bedside.    PICC/Midline Placement Documentation  PICC Single Lumen 33/38/32 PICC Right Basilic 38 cm 0 cm (Active)  Indication for Insertion or Continuance of Line Prolonged intravenous therapies 12/30/18 1250  Exposed Catheter (cm) 0 cm 12/30/18 1250  Site Assessment Clean;Dry;Intact 12/30/18 1250  Line Status Flushed;Blood return noted 12/30/18 1250  Dressing Type Transparent 12/30/18 1250  Dressing Status Clean;Dry;Intact;Antimicrobial disc in place 12/30/18 1250  Dressing Intervention New dressing 12/30/18 1250  Dressing Change Due 01/06/19 12/30/18 1250       Christella Noa Albarece 12/30/2018, 12:51 PM

## 2018-12-30 NOTE — Progress Notes (Signed)
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  Form will be left at [facility] front desk for daughter to pick up so she can submit along w mother's portion to [place].  [name], LCSW Clinical Social Worker Phone:  [phone]

## 2018-12-30 NOTE — TOC Progression Note (Signed)
Transition of Care [facility]) - Progression Note    Patient Details  Name: Suzanne [name] MRN: [id number] Date of Birth: [birth year]  Transition of Care [facility]) CM/SW [name], [place] Phone Number: [phone] 12/30/2018, 3:25 PM  Clinical Narrative: Left voicemail with pt's daughter to follow up after last conversation with her re: care needs at home. Now in light of pt needing IV abx until 01/21/2019, investigating options.     (Pt active with [facility] for charity home health)      Expected Discharge Plan and Services           Expected Discharge Date: (unknown)                                     Social Determinants of Health (SDOH) Interventions    Readmission Risk Interventions Readmission Risk Prevention Plan 12/01/2018 11/17/2018  Transportation Screening Complete Complete  PCP or Specialist Appt within 3-5 Days - Not Complete  Not Complete comments - will need pcp set up  [place] or [place] - Complete  Social Work Consult for [place] Planning/Counseling - Complete  Palliative Care Screening - Not Complete  Palliative Care Screening Not Complete Comments - pending- bedside RN states plan to consult palliative this admission  Medication Review ([place]) Complete Complete  PCP or Specialist appointment within 3-5 days of discharge Complete -  [place] or Home Care Consult Complete -  SW Recovery Care/Counseling Consult Complete -  Palliative Care Screening Not Applicable -  Skilled Nursing Facility Complete -  Some recent data might be hidden

## 2018-12-31 ENCOUNTER — Encounter: Payer: Self-pay | Admitting: General Practice

## 2018-12-31 DIAGNOSIS — J9601 Acute respiratory failure with hypoxia: Secondary | ICD-10-CM

## 2018-12-31 LAB — BASIC METABOLIC PANEL
Anion gap: 8 (ref 5–15)
BUN: 20 mg/dL (ref 6–20)
CO2: 33 mmol/L — ABNORMAL HIGH (ref 22–32)
Calcium: 7.7 mg/dL — ABNORMAL LOW (ref 8.9–10.3)
Chloride: 99 mmol/L (ref 98–111)
Creatinine, Ser: 0.39 mg/dL — ABNORMAL LOW (ref 0.44–1.00)
GFR calc Af Amer: >60 mL/min (ref >60)
GFR calc non Af Amer: >60 mL/min (ref >60)
Glucose, Bld: 120 mg/dL — ABNORMAL HIGH (ref 70–99)
Potassium: 4.4 mmol/L (ref 3.5–5.1)
Sodium: 140 mmol/L (ref 135–145)

## 2018-12-31 LAB — CBC
HCT: 29.2 % — ABNORMAL LOW (ref 36.0–46.0)
Hemoglobin: 8.6 g/dL — ABNORMAL LOW (ref 12.0–15.0)
MCH: 32.1 pg (ref 26.0–34.0)
MCHC: 29.5 g/dL — ABNORMAL LOW (ref 30.0–36.0)
MCV: 109 fL — ABNORMAL HIGH (ref 80.0–100.0)
Platelets: 359 10*3/uL (ref 150–400)
RBC: 2.68 MIL/uL — ABNORMAL LOW (ref 3.87–5.11)
RDW: 20.5 % — ABNORMAL HIGH (ref 11.5–15.5)
WBC: 13 10*3/uL — ABNORMAL HIGH (ref 4.0–10.5)
nRBC: 3.6 % — ABNORMAL HIGH (ref 0.0–0.2)

## 2018-12-31 LAB — CULTURE, BLOOD (ROUTINE X 2)
Culture: NO GROWTH
Culture: NO GROWTH
Special Requests: ADEQUATE
Special Requests: ADEQUATE

## 2018-12-31 MED ORDER — CEFAZOLIN IV (FOR PTA / DISCHARGE USE ONLY): 2.0000 g | Freq: Three times a day (TID) | INTRAVENOUS | 0 refills | Status: DC

## 2018-12-31 NOTE — Progress Notes (Signed)
PROGRESS NOTE    Suzanne [name]  ERD:[id number] DOB: 1960/12/22 DOA: 12/25/2018 PCP: Patient, No Pcp Per    Brief Narrative:  58 y.o. 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  Patient was sitting bedside commode and she was not able to stand up and she developed worsening shortness of breath.  In the ED, patient was noted to be febrile with temperature 100, tachycardic.  Labs sodium 136, potassium 3.6, CO2 33, BUN 12 calcium 7.0, alkaline phosphatase 235, albumin 2.3, LDH 244, ferritin 2306, CRP 19, procalcitonin less than 0.10, d-dimer 7.9, fibrinogen more than 800, white blood cell 8.1, hemoglobin 9.6, platelets 214.  BNP 89. Chest xray and CTA of the chest  was done and the patient was admitted to the hospital.   Assessment & Plan:   Active Problems:   Bone metastasis (HCC)   Pleural effusion, bilateral   Carcinoma of breast, estrogen receptor positive, stage 4, right (HCC)   Acute on chronic respiratory failure with hypoxia (HCC)   Pneumonia   Respiratory failure, acute (HCC)  Acute on chronic hypoxic respiratory failure: Probably related to pneumonia, possible lymphangitic spread.. Patient presented with worsening hypoxia.  CT scan was negative for pulmonary evaluation but fibrosis and was unable to rule out superimposed infection.   Bilateral pleural effusion was small. SARS coronavirus test neg x 2.  Patient was continued on oxygen nebulizer IV steroids Solu-Medrol which has been decreased to 40 mg daily.  He was initially on BiPAP which has been weaned off at this time.  Stage IV metastatic  breast cancer to bone and lung, pleural effusion; Reportedly patient is undergoing radiation.  She does have pathological left humerus fracture and is supposed to follow-up with Ortho as outpatient.  Anemia of chronic disease likely from malignancy.-Had received blood transfusion as outpatient.  Currently hemoglobin of 8.6 and is stable.  elevated d-dimer:  -CT angio of the chest was negative for PE. LE dopplers reviewed, neg for DVT.  elevated alkaline phosphatase:  -Likely related to metastatic bone disease.  Chronic diastolic Heart failure;  Patient received IV Lasix on admission.  BNP within normal limits.  On nasal cannula oxygen.  Currently compensated.  MSSA bacteremia -PICC line was placed on 12/30/2018.  Patient has been seen by ID who recommended total 4 weeks of antibiotic.  Repeat blood cultures have been negative.  2D echocardiogram was unremarkable.  Patient remains afebrile without leukocytosis.  L and R buttock pressure injury, present on admission, stage 2 Continue wound care.  DVT prophylaxis: Lovenox subQ  Code Status: Full code  Family Communication: None  Disposition Plan: SNF versus home with home health.  Social worker on board regarding disposition.  Patient is stable for disposition pending arrangement with IV antibiotics and physical therapy  Consultants:   Oncology  ID  Procedures:   LE dopplers 7/31, neg for dvt  2d echo 7/31  Antimicrobials: Anti-infectives (From admission, onward)   Start     Dose/Rate Route Frequency Ordered Stop   12/26/18 0600  ceFAZolin (ANCEF) IVPB 2g/100 mL premix     2 g 200 mL/hr over 30 Minutes Intravenous Every 8 hours 12/26/18 0445  12/25/18 2300  vancomycin (VANCOCIN) IVPB 1000 mg/200 mL premix  Status:  Discontinued     1,000 mg 200 mL/hr over 60 Minutes Intravenous Every 12 hours 12/25/18 1101 12/26/18 0444   12/25/18 2200  ceFEPIme (MAXIPIME) 2 g in sodium chloride 0.9 % 100 mL IVPB  Status:   Discontinued     2 g 200 mL/hr over 30 Minutes Intravenous Every 8 hours 12/25/18 1447 12/26/18 0444   12/25/18 1515  ceFEPIme (MAXIPIME) 1 g in sodium chloride 0.9 % 100 mL IVPB     1 g 200 mL/hr over 30 Minutes Intravenous  Once 12/25/18 1446 12/25/18 1728   12/25/18 1115  vancomycin (VANCOCIN) 2,000 mg in sodium chloride 0.9 % 500 mL IVPB     2,000 mg 250 mL/hr over 120 Minutes Intravenous  Once 12/25/18 1101 12/25/18 1405   12/25/18 1045  ceFEPIme (MAXIPIME) 1 g in sodium chloride 0.9 % 100 mL IVPB     1 g 200 mL/hr over 30 Minutes Intravenous  Once 12/25/18 1022 12/25/18 1207      Subjective: Patient complains of some nausea yesterday and had a bowel movement.  Denies any abdominal pain.  She states that her breathing is okay.  No chest pain or shortness of breath.  Objective: Vitals:   12/30/18 1404 12/30/18 2024 12/30/18 2139 12/31/18 0427  BP: (!) 143/71 (!) 134/58  140/62  Pulse: 97 91  87  Resp: 20 18  19   Temp: 98.3 F (36.8 C)   98 F (36.7 C)  TempSrc:    Oral  SpO2: 98% 100% 98% 100%  Weight:      Height:        Intake/Output Summary (Last 24 hours) at 12/31/2018 1150 Last data filed at 12/31/2018 0210 Gross per 24 hour  Intake 200 ml  Output 900 ml  Net -700 ml   Filed Weights   12/27/18 0316 12/28/18 0629 12/29/18 0518  Weight: 90.4 kg 88.2 kg 88.9 kg    Physical examination: General: Alert awake communicative not in obvious distress, on nasal cannula oxygen HENT: Normocephalic, pupils equally reacting to light and accommodation.  No scleral pallor or icterus noted. Oral mucosa is moist.  Chest: Diminished breath sounds bilaterally..  CVS: S1 &S2 heard. No murmur.  Regular rate and rhythm. Abdomen: Soft, nontender, nondistended.  Bowel sounds are heard.  Liver is not palpable, no abdominal mass palpated.  External Foley catheter in place. Extremities: No cyanosis, clubbing or edema.  Peripheral pulses are palpable.  Left upper extremity on a sling.   Right upper extremity with PICC line in place. Psych: Alert, awake and oriented, normal mood CNS:  No cranial nerve deficits.  Power equal in all extremities.  No sensory deficits noted.  No cerebellar signs.   Skin: Warm and dry, stage II pressure ulceration present on admission   Data Reviewed: I have personally reviewed following labs and imaging studies  CBC: Recent Labs  Lab 12/25/18 1009 12/26/18 0235 12/27/18 0213 12/31/18 0341  WBC 8.1 6.6 8.1 13.0*  NEUTROABS 5.0  --   --   --   HGB 9.6* 8.2* 8.8* 8.6*  HCT 31.6* 27.8* 29.6* 29.2*  MCV 104.6* 106.1* 106.9* 109.0*  PLT 214 226 282 979   Basic Metabolic Panel: Recent Labs  Lab 12/25/18 1009 12/26/18 0235 12/27/18 0213 12/31/18 0341  NA 136 139 139 140  K 3.6 3.2* 4.1 4.4  CL 92* 96* 96* 99  CO2 33* 35* 34* 33*  GLUCOSE 159*  172* 173* 120*  BUN 12 13 17 20   CREATININE 0.40* 0.44 0.46 0.39*  CALCIUM 7.0* 6.1* 7.0* 7.7*   GFR: Estimated Creatinine Clearance: 80.1 mL/min (A) (by C-G formula based on SCr of 0.39 mg/dL (L)). Liver Function Tests: Recent Labs  Lab 12/25/18 1009  AST 24  ALT 35  ALKPHOS 235*  BILITOT 0.6  PROT 7.0  ALBUMIN 2.3*   No results for input(s): LIPASE, AMYLASE in the last 168 hours. No results for input(s): AMMONIA in the last 168 hours. Coagulation Profile: No results for input(s): INR, PROTIME in the last 168 hours. Cardiac Enzymes: No results for input(s): CKTOTAL, CKMB, CKMBINDEX, TROPONINI in the last 168 hours. BNP (last 3 results) No results for input(s): PROBNP in the last 8760 hours. HbA1C: No results for input(s): HGBA1C in the last 72 hours. CBG: No results for input(s): GLUCAP in the last 168 hours. Lipid Profile: No results for input(s): CHOL, HDL, LDLCALC, TRIG, CHOLHDL, LDLDIRECT in the last 72 hours. Thyroid Function Tests: No results for input(s): TSH, T4TOTAL, FREET4, T3FREE, THYROIDAB in the last 72 hours. Anemia Panel: No results for input(s):  VITAMINB12, FOLATE, FERRITIN, TIBC, IRON, RETICCTPCT in the last 72 hours. Sepsis Labs: Recent Labs  Lab 12/25/18 1009  PROCALCITON <0.10  LATICACIDVEN 1.0    Recent Results (from the past 240 hour(s))  SARS Coronavirus 2 [facility] order, Performed in [facility] hospital lab)     Status: None   Collection Time: 12/25/18 10:09 AM   Specimen: Nasopharyngeal Swab  Result Value Ref Range Status   SARS Coronavirus 2 NEGATIVE NEGATIVE Final    Comment: (NOTE) If result is NEGATIVE SARS-CoV-2 target nucleic acids are NOT DETECTED. The SARS-CoV-2 RNA is generally detectable in upper and lower  respiratory specimens during the acute phase of infection. The lowest  concentration of SARS-CoV-2 viral copies this assay can detect is 250  copies / mL. A negative result does not preclude SARS-CoV-2 infection  and should not be used as the sole basis for treatment or other  patient management decisions.  A negative result may occur with  improper specimen collection / handling, submission of specimen other  than nasopharyngeal swab, presence of viral mutation(s) within the  areas targeted by this assay, and inadequate number of viral copies  (<250 copies / mL). A negative result must be combined with clinical  observations, patient history, and epidemiological information. If result is POSITIVE SARS-CoV-2 target nucleic acids are DETECTED. The SARS-CoV-2 RNA is generally detectable in upper and lower  respiratory specimens dur ing the acute phase of infection.  Positive  results are indicative of active infection with SARS-CoV-2.  Clinical  correlation with patient history and other diagnostic information is  necessary to determine patient infection status.  Positive results do  not rule out bacterial infection or co-infection with other viruses. If result is PRESUMPTIVE POSTIVE SARS-CoV-2 nucleic acids MAY BE PRESENT.   A presumptive positive result was obtained on the submitted specimen   and confirmed on repeat testing.  While 2019 novel coronavirus  (SARS-CoV-2) nucleic acids may be present in the submitted sample  additional confirmatory testing may be necessary for epidemiological  and / or clinical management purposes  to differentiate between  SARS-CoV-2 and other Sarbecovirus currently known to infect humans.  If clinically indicated additional testing with an alternate test  methodology [phone]) is advised. The SARS-CoV-2 RNA is generally  detectable in upper and lower respiratory sp ecimens during the acute  phase of infection. The  expected result is Negative. Fact Sheet for Patients:  StrictlyIdeas.no Fact Sheet for Healthcare Providers: BankingDealers.co.za This test is not yet approved or cleared by the [place] FDA and has been authorized for detection and/or diagnosis of SARS-CoV-2 by FDA under an Emergency Use Authorization (EUA).  This EUA will remain in effect (meaning this test can be used) for the duration of the COVID-19 declaration under Section 564(b)(1) of the Act, 21 U.S.C. section 360bbb-3(b)(1), unless the authorization is terminated or revoked sooner. Performed at [facility], [place] [address], [place], [place] [address]   Blood Culture (routine x 2)     Status: Abnormal   Collection Time: 12/25/18 10:09 AM   Specimen: BLOOD  Result Value Ref Range Status   Specimen Description   Final    BLOOD RIGHT ANTECUBITAL Performed at [place] [address], [place], [place] [address]    Special Requests   Final    BOTTLES DRAWN AEROBIC AND ANAEROBIC Blood Culture adequate volume Performed at [place] [address] [place], [place] [address]    Culture  Setup Time   Final    GRAM POSITIVE COCCI IN CLUSTERS IN BOTH AEROBIC AND ANAEROBIC BOTTLES CRITICAL RESULT CALLED TO, READ BACK BY AND VERIFIED WITH: Audrea Muscat [id number]  12/26/2018 Mena Goes Performed at [facility], [place] [address], [place], [place] [address]    Culture STAPHYLOCOCCUS AUREUS (A)  Final   Report Status 12/28/2018 FINAL  Final   Organism ID, Bacteria STAPHYLOCOCCUS AUREUS  Final      Susceptibility   Staphylococcus aureus - MIC*    CIPROFLOXACIN <=0.5 SENSITIVE Sensitive     ERYTHROMYCIN 1 INTERMEDIATE Intermediate     GENTAMICIN <=0.5 SENSITIVE Sensitive     OXACILLIN <=0.25 SENSITIVE Sensitive     TETRACYCLINE <=1 SENSITIVE Sensitive     VANCOMYCIN <=0.5 SENSITIVE Sensitive     TRIMETH/SULFA <=10 SENSITIVE Sensitive     CLINDAMYCIN RESISTANT Resistant     RIFAMPIN <=0.5 SENSITIVE Sensitive     Inducible Clindamycin POSITIVE Resistant     * STAPHYLOCOCCUS AUREUS  Blood Culture ID Panel (Reflexed)     Status: Abnormal   Collection Time: 12/25/18 10:09 AM  Result Value Ref Range Status   Enterococcus species NOT DETECTED NOT DETECTED Final   Listeria monocytogenes NOT DETECTED NOT DETECTED Final   Staphylococcus species DETECTED (A) NOT DETECTED Final    Comment: CRITICAL RESULT CALLED TO, READ BACK BY AND VERIFIED WITH: J. GRIMSLEY,PHARMD [id number] 12/26/2018 T. TYSOR    Staphylococcus aureus (BCID) DETECTED (A) NOT DETECTED Final    Comment: Methicillin (oxacillin) susceptible Staphylococcus aureus (MSSA). Preferred therapy is anti staphylococcal beta lactam antibiotic (Cefazolin or Nafcillin), unless clinically contraindicated. CRITICAL RESULT CALLED TO, READ BACK BY AND VERIFIED WITH: J. Fabiola Backer [id number] 12/26/2018 T. TYSOR    Methicillin resistance NOT DETECTED NOT DETECTED Final   Streptococcus species NOT DETECTED NOT DETECTED Final   Streptococcus agalactiae NOT DETECTED NOT DETECTED Final   Streptococcus pneumoniae NOT DETECTED NOT DETECTED Final   Streptococcus pyogenes NOT DETECTED NOT DETECTED Final   Acinetobacter baumannii NOT DETECTED NOT DETECTED Final   Enterobacteriaceae species NOT DETECTED NOT DETECTED  Final   Enterobacter cloacae complex NOT DETECTED NOT DETECTED Final   Escherichia coli NOT DETECTED NOT DETECTED Final   Klebsiella oxytoca NOT DETECTED NOT DETECTED Final   Klebsiella pneumoniae NOT DETECTED NOT DETECTED Final   Proteus species NOT DETECTED NOT DETECTED Final  Serratia marcescens NOT DETECTED NOT DETECTED Final   Haemophilus influenzae NOT DETECTED NOT DETECTED Final   Neisseria meningitidis NOT DETECTED NOT DETECTED Final   Pseudomonas aeruginosa NOT DETECTED NOT DETECTED Final   Candida albicans NOT DETECTED NOT DETECTED Final   Candida glabrata NOT DETECTED NOT DETECTED Final   Candida krusei NOT DETECTED NOT DETECTED Final   Candida parapsilosis NOT DETECTED NOT DETECTED Final   Candida tropicalis NOT DETECTED NOT DETECTED Final    Comment: Performed at [facility], [place] [address], [place], [place] [address]  Blood Culture (routine x 2)     Status: Abnormal   Collection Time: 12/25/18 10:15 AM   Specimen: BLOOD LEFT HAND  Result Value Ref Range Status   Specimen Description   Final    BLOOD LEFT HAND Performed at [facility] [place] [address], [place], [place] [address]    Special Requests   Final    BOTTLES DRAWN AEROBIC AND ANAEROBIC Blood Culture results may not be optimal due to an inadequate volume of blood received in culture bottles Performed at [facility] [address] [place], [place] [address]    Culture  Setup Time   Final    GRAM POSITIVE COCCI IN CLUSTERS IN BOTH AEROBIC AND ANAEROBIC BOTTLES CRITICAL VALUE NOTED.  VALUE IS CONSISTENT WITH PREVIOUSLY REPORTED AND CALLED VALUE.    Culture (A)  Final    STAPHYLOCOCCUS AUREUS SUSCEPTIBILITIES PERFORMED ON PREVIOUS CULTURE WITHIN THE LAST 5 DAYS. Performed at [facility], [place] [address] [place], [place] [address]    Report Status 12/28/2018 FINAL  Final  SARS Coronavirus 2 (CEPHEID - Performed in [place] hospital lab), Hosp Order      Status: None   Collection Time: 12/25/18  2:44 PM   Specimen: Nasopharyngeal Swab  Result Value Ref Range Status   SARS Coronavirus 2 NEGATIVE NEGATIVE Final    Comment: (NOTE) If result is NEGATIVE SARS-CoV-2 target nucleic acids are NOT DETECTED. The SARS-CoV-2 RNA is generally detectable in upper and lower  respiratory specimens during the acute phase of infection. The lowest  concentration of SARS-CoV-2 viral copies this assay can detect is 250  copies / mL. A negative result does not preclude SARS-CoV-2 infection  and should not be used as the sole basis for treatment or other  patient management decisions.  A negative result may occur with  improper specimen collection / handling, submission of specimen other  than nasopharyngeal swab, presence of viral mutation(s) within the  areas targeted by this assay, and inadequate number of viral copies  (<250 copies / mL). A negative result must be combined with clinical  observations, patient history, and epidemiological information. If result is POSITIVE SARS-CoV-2 target nucleic acids are DETECTED. The SARS-CoV-2 RNA is generally detectable in upper and lower  respiratory specimens dur ing the acute phase of infection.  Positive  results are indicative of active infection with SARS-CoV-2.  Clinical  correlation with patient history and other diagnostic information is  necessary to determine patient infection status.  Positive results do  not rule out bacterial infection or co-infection with other viruses. If result is PRESUMPTIVE POSTIVE SARS-CoV-2 nucleic acids MAY BE PRESENT.   A presumptive positive result was obtained on the submitted specimen  and confirmed on repeat testing.  While 2019 novel coronavirus  (SARS-CoV-2) nucleic acids may be present in the submitted sample  additional confirmatory testing may be necessary for epidemiological  and / or  clinical management purposes  to differentiate between  SARS-CoV-2 and other  Sarbecovirus currently known to infect humans.  If clinically indicated additional testing with an alternate test  methodology [phone]) is advised. The SARS-CoV-2 RNA is generally  detectable in upper and lower respiratory sp ecimens during the acute  phase of infection. The expected result is Negative. Fact Sheet for Patients:  StrictlyIdeas.no Fact Sheet for Healthcare Providers: BankingDealers.co.za This test is not yet approved or cleared by the [place] FDA and has been authorized for detection and/or diagnosis of SARS-CoV-2 by FDA under an Emergency Use Authorization (EUA).  This EUA will remain in effect (meaning this test can be used) for the duration of the COVID-19 declaration under Section 564(b)(1) of the Act, 21 U.S.C. section 360bbb-3(b)(1), unless the authorization is terminated or revoked sooner. Performed at [facility], [place] [address], [place], [place] [address]   MRSA PCR Screening     Status: None   Collection Time: 12/25/18  5:16 PM   Specimen: Nasal Mucosa; Nasopharyngeal  Result Value Ref Range Status   MRSA by PCR NEGATIVE NEGATIVE Final    Comment:        The GeneXpert MRSA Assay (FDA approved for NASAL specimens only), is one component of a comprehensive MRSA colonization surveillance program. It is not intended to diagnose MRSA infection nor to guide or monitor treatment for MRSA infections. Performed at [facility], [place] [address], [place], [place] [address]   Culture, blood (routine x 2)     Status: None (Preliminary result)   Collection Time: 12/26/18  5:36 PM   Specimen: BLOOD  Result Value Ref Range Status   Specimen Description   Final    BLOOD RIGHT ANTECUBITAL Performed at [facility] [address], [place], [place] [address]    Special Requests   Final    BOTTLES DRAWN AEROBIC ONLY Blood Culture adequate volume Performed  at [place] [address] [place], [place] [address]    Culture   Final    NO GROWTH 4 DAYS Performed at [facility], [place] [address] [place], [place] [address]    Report Status PENDING  Incomplete  Culture, blood (routine x 2)     Status: None (Preliminary result)   Collection Time: 12/26/18  5:36 PM   Specimen: BLOOD RIGHT HAND  Result Value Ref Range Status   Specimen Description   Final    BLOOD RIGHT HAND Performed at [facility] [address], [place], [place] [address]    Special Requests   Final    BOTTLES DRAWN AEROBIC ONLY Blood Culture adequate volume Performed at [facility] [address], [place], [place] [address]    Culture   Final    NO GROWTH 4 DAYS Performed at [facility], [place] [address] [place],  [address]    Report Status PENDING  Incomplete     Radiology Studies: [place] Ekg Site Rite  Result Date: 12/30/2018 If Site Rite image not attached, placement could not be confirmed due to current cardiac rhythm.   Scheduled Meds: . anastrozole  1 mg Oral Daily  . calcium-vitamin D  1 tablet Oral TID  . carvedilol  6.25 mg Oral BID WC  . chlorhexidine  15 mL Mouth Rinse BID  . Chlorhexidine Gluconate Cloth  6 each Topical Q0600  . docusate sodium  100 mg Oral BID  . enoxaparin (LOVENOX) injection  40 mg Subcutaneous Q24H  . feeding supplement (ENSURE ENLIVE)  237 mL Oral BID BM  . feeding supplement (PRO-STAT SUGAR FREE 64)  30 mL Oral TID BM  . loratadine  10 mg Oral Daily  . mouth rinse  15 mL Mouth Rinse q12n4p  . methylPREDNISolone (SOLU-MEDROL) injection  40 mg Intravenous Daily  . multivitamin with minerals  1 tablet Oral Daily  . nystatin   Topical TID  . sodium chloride flush  3 mL Intravenous Q12H   Continuous Infusions: . sodium chloride    .  ceFAZolin (ANCEF) IV 2 g (12/31/18 0459)     LOS: 6 days   Flora Lipps, MD [facility] Pager On  Amion  If 7PM-7AM, please contact night-coverage 12/31/2018, 11:50 AM

## 2018-12-31 NOTE — Progress Notes (Signed)
[place] CSW Progress Notes  Is working Counselling psychologist group to get wheelchair ramp built at their home at no cost to family.  Plan is to construct ramp out back door, hopes to work on it in approx two weeks.  However, these are volunteers and timeline may not be exact.    [name], LCSW Clinical Social Worker Phone:  [phone] Cell:  [phone]

## 2018-12-31 NOTE — TOC Progression Note (Addendum)
Transition of Care [facility]) - Progression Note    Patient Details  Name: Suzanne [name] MRN: [id number] Date of Birth: 1960-12-05  Transition of Care [facility]) CM/SW [place], [place] Phone Number: 12/31/2018, 3:35 PM  Clinical Narrative:    CSW spoke with the patient daughter about disposition planning-SNF vs. Home for IV antibiotics. Patient daughter has been activly working with DSS case worker Teacher, adult education on [employer] application. Per daughter all documents have been submitted. Daughter is waiting for an update.   The daughter is the patient primary caregiver and has difficult time getting the patient into the home, while currently in a wheelchair. She reports a wheelchair ramp is to be built in two weeks.  Per Oncology SW note, the ramp is being built by volunteers and may not be done in time.   Patient has pending orthopedic appt. /future daily radiation for two weeks.  CSW discussed case with TOC leadership. Leadership is agreeable to provide charity to SNF placement  that will accept the patient for 4 wks of IV cefazolin.   CSW discussed case with oncology SW.  CSW will continue to assist with patient disposition planning.          Expected Discharge Plan and Services   SNF- TBD         Expected Discharge Date: (unknown)                                     Social Determinants of Health (SDOH) Interventions    Readmission Risk Interventions Readmission Risk Prevention Plan 12/01/2018 11/17/2018  Transportation Screening Complete Complete  PCP or Specialist Appt within 3-5 Days - Not Complete  Not Complete comments - will need pcp set up  [place] or [place] - Complete  Social Work Consult for [place] Planning/Counseling - Complete  Palliative Care Screening - Not Complete  Palliative Care Screening Not Complete Comments - pending- bedside RN states plan to consult palliative this admission  Medication Review ([place]) Complete  Complete  PCP or Specialist appointment within 3-5 days of discharge Complete -  [place] or Home Care Consult Complete -  SW Recovery Care/Counseling Consult Complete -  Palliative Care Screening Not Applicable -  Skilled Nursing Facility Complete -  Some recent data might be hidden

## 2019-01-01 ENCOUNTER — Ambulatory Visit: Payer: Self-pay | Admitting: Orthopedic Surgery

## 2019-01-01 LAB — CBC
HCT: 29.4 % — ABNORMAL LOW (ref 36.0–46.0)
Hemoglobin: 8.4 g/dL — ABNORMAL LOW (ref 12.0–15.0)
MCH: 31.5 pg (ref 26.0–34.0)
MCHC: 28.6 g/dL — ABNORMAL LOW (ref 30.0–36.0)
MCV: 110.1 fL — ABNORMAL HIGH (ref 80.0–100.0)
Platelets: 368 10*3/uL (ref 150–400)
RBC: 2.67 MIL/uL — ABNORMAL LOW (ref 3.87–5.11)
RDW: 20.7 % — ABNORMAL HIGH (ref 11.5–15.5)
WBC: 13.1 10*3/uL — ABNORMAL HIGH (ref 4.0–10.5)
nRBC: 2.3 % — ABNORMAL HIGH (ref 0.0–0.2)

## 2019-01-01 LAB — COMPREHENSIVE METABOLIC PANEL
ALT: 12 U/L (ref 0–44)
AST: 17 U/L (ref 15–41)
Albumin: 2.3 g/dL — ABNORMAL LOW (ref 3.5–5.0)
Alkaline Phosphatase: 199 U/L — ABNORMAL HIGH (ref 38–126)
Anion gap: 3 — ABNORMAL LOW (ref 5–15)
BUN: 14 mg/dL (ref 6–20)
CO2: 35 mmol/L — ABNORMAL HIGH (ref 22–32)
Calcium: 7.9 mg/dL — ABNORMAL LOW (ref 8.9–10.3)
Chloride: 100 mmol/L (ref 98–111)
Creatinine, Ser: 0.39 mg/dL — ABNORMAL LOW (ref 0.44–1.00)
GFR calc Af Amer: >60 mL/min (ref >60)
GFR calc non Af Amer: >60 mL/min (ref >60)
Glucose, Bld: 94 mg/dL (ref 70–99)
Potassium: 3.9 mmol/L (ref 3.5–5.1)
Sodium: 138 mmol/L (ref 135–145)
Total Bilirubin: 0.4 mg/dL (ref 0.3–1.2)
Total Protein: 5.4 g/dL — ABNORMAL LOW (ref 6.5–8.1)

## 2019-01-01 LAB — PHOSPHORUS: Phosphorus: 2.4 mg/dL — ABNORMAL LOW (ref 2.5–4.6)

## 2019-01-01 LAB — MAGNESIUM: Magnesium: 2.3 mg/dL (ref 1.7–2.4)

## 2019-01-01 NOTE — NC FL2 (Signed)
[place] LEVEL OF CARE SCREENING TOOL     IDENTIFICATION  Patient Name: Suzanne [name] Birthdate: March 16, 1961 Sex: female Admission Date (Current Location): 12/25/2018  [facility] and [place] Number:  [place] pending Medicaid Facility and Address:  [facility],  [place] [address] [place]      Provider Number: [id number]  Attending Physician Name and Address:  Flora Lipps, MD  Relative Name and Phone Number:  [phone] Daughter [name]    Current Level of Care: Hospital Recommended Level of Care: [place] Prior Approval Number:    Date Approved/Denied:   PASRR Number: pending  Discharge Plan: SNF    Current Diagnoses: Patient Active Problem List   Diagnosis Date Noted  . Pneumonia 12/25/2018  . Respiratory failure, acute ([place]) 12/25/2018  . Bone metastases ([place]) 12/24/2018  . Family history of breast cancer   . Family history of prostate cancer   . Family history of melanoma   . Family history of colon cancer   . Pressure injury of skin 11/24/2018  . Acute on chronic respiratory failure with hypoxia ([place]) 11/19/2018  . Pleural effusion, malignant 11/19/2018  . Hypophosphatemia 11/19/2018  . Hypocalcemia 11/19/2018  . Aspiration pneumonia ([place]) 11/19/2018  . SOB (shortness of breath)   . Palliative care by specialist   . Carcinoma of breast, estrogen receptor positive, stage 4, right ([place]) 11/16/2018  . Hypoalbuminemia 11/16/2018  . Pleural effusion 11/14/2018  . Goals of care, counseling/discussion 11/06/2018  . Malignant neoplasm of overlapping sites of right breast in female, estrogen receptor positive ([place]) 10/31/2018  . Lung metastasis ([place]) 10/31/2018  . Pain from bone metastases ([place]) 10/31/2018  . Morbid obesity with BMI of 40.0-44.9, adult ([place]) 10/31/2018  . Bone injury   . Pleural effusion, bilateral   . Shortness of breath   . Abnormal breast finding   . Hypokalemia   . Breast skin  changes   . Anemia   . Bone metastasis ([place])   . Acute anemia 10/26/2018    Orientation RESPIRATION BLADDER Height & Weight     Self, Time, Situation, Place  O2(2L) Incontinent Weight: 196 lb (88.9 kg) Height:  5\' 3"  (160 cm)  BEHAVIORAL SYMPTOMS/MOOD NEUROLOGICAL BOWEL NUTRITION STATUS      Continent Diet(regular)  AMBULATORY STATUS COMMUNICATION OF NEEDS Skin   Extensive Assist(uses cane/wheelchair) Verbally PU Stage and Appropriate Care(deep tissue injury sacrum, stage II in places, foam dressing changes x3 days)                       Personal Care Assistance Level of Assistance  Bathing, Feeding, Dressing Bathing Assistance: Limited assistance Feeding assistance: Independent Dressing Assistance: Limited assistance     Functional Limitations Info  Sight, Hearing, Speech Sight Info: Adequate Hearing Info: Impaired(hard of hearing) Speech Info: Adequate    SPECIAL CARE FACTORS FREQUENCY                       Contractures Contractures Info: Not present    Additional Factors Info  Code Status, Allergies Code Status Info: full code Allergies Info: nka           Current Medications (01/01/2019):  This is the current hospital active medication list Current Facility-Administered Medications  Medication Dose Route Frequency Provider Last Rate Last Dose  . 0.9 %  sodium chloride infusion  250 mL Intravenous PRN Donne Hazel, MD      . acetaminophen (TYLENOL) tablet 650  mg  650 mg Oral Q6H PRN Donne Hazel, MD   650 mg at 12/26/18 2054   Or  . acetaminophen (TYLENOL) suppository 650 mg  650 mg Rectal Q6H PRN Donne Hazel, MD      . albuterol (PROVENTIL) (2.5 MG/3ML) 0.083% nebulizer solution 2.5 mg  2.5 mg Nebulization Q6H PRN Donne Hazel, MD   2.5 mg at 12/30/18 2139  . ALPRAZolam Duanne Moron) tablet 0.25-0.5 mg  0.25-0.5 mg Oral Q8H PRN Donne Hazel, MD      . anastrozole (ARIMIDEX) tablet 1 mg  1 mg Oral Daily Donne Hazel, MD   1 mg at  12/31/18 1004  . antiseptic oral rinse (BIOTENE) solution 15 mL  15 mL Mouth Rinse PRN Donne Hazel, MD      . calcium-vitamin D (OSCAL WITH D) 500-200 MG-UNIT per tablet 1 tablet  1 tablet Oral TID Donne Hazel, MD   1 tablet at 12/31/18 2126  . carvedilol (COREG) tablet 6.25 mg  6.25 mg Oral BID WC Donne Hazel, MD   6.25 mg at 12/31/18 1759  . ceFAZolin (ANCEF) IVPB 2g/100 mL premix  2 g Intravenous Q8H Donne Hazel, MD 200 mL/hr at 01/01/19 0523 2 g at 01/01/19 0523  . chlorhexidine (PERIDEX) 0.12 % solution 15 mL  15 mL Mouth Rinse BID Donne Hazel, MD   15 mL at 12/31/18 2126  . Chlorhexidine Gluconate Cloth 2 % PADS 6 each  6 each Topical Q0600 Donne Hazel, MD   6 each at 12/27/18 [id number]  . docusate sodium (COLACE) capsule 100 mg  100 mg Oral BID Donne Hazel, MD   100 mg at 12/31/18 2126  . enoxaparin (LOVENOX) injection 40 mg  40 mg Subcutaneous Q24H Donne Hazel, MD   40 mg at 12/31/18 2126  . feeding supplement (ENSURE ENLIVE) (ENSURE ENLIVE) liquid 237 mL  237 mL Oral BID BM Donne Hazel, MD   237 mL at 12/31/18 1005  . feeding supplement (PRO-STAT SUGAR FREE 64) liquid 30 mL  30 mL Oral TID BM Donne Hazel, MD   30 mL at 12/30/18 0856  . loratadine (CLARITIN) tablet 10 mg  10 mg Oral Daily Donne Hazel, MD   10 mg at 12/31/18 1004  . MEDLINE mouth rinse  15 mL Mouth Rinse q12n4p Donne Hazel, MD   15 mL at 12/28/18 1740  . methylPREDNISolone sodium succinate (SOLU-MEDROL) 40 mg/mL injection 40 mg  40 mg Intravenous Daily Donne Hazel, MD   40 mg at 12/31/18 1004  . multivitamin with minerals tablet 1 tablet  1 tablet Oral Daily Donne Hazel, MD   1 tablet at 12/31/18 1004  . nystatin (MYCOSTATIN/NYSTOP) topical powder   Topical TID Donne Hazel, MD      . ondansetron [facility]) tablet 4 mg  4 mg Oral Q6H PRN Donne Hazel, MD       Or  . ondansetron [facility]) injection 4 mg  4 mg Intravenous Q6H PRN Donne Hazel, MD   4 mg at 12/30/18 2021   . oxyCODONE (Oxy IR/ROXICODONE) immediate release tablet 2.5 mg  2.5 mg Oral Q4H PRN Donne Hazel, MD   2.5 mg at 12/29/18 1345  . polyethylene glycol (MIRALAX / GLYCOLAX) packet 17 g  17 g Oral Daily PRN Donne Hazel, MD      . sodium chloride flush (NS) 0.9 % injection 10-40 mL  10-40  mL Intracatheter PRN Donne Hazel, MD      . sodium chloride flush (NS) 0.9 % injection 3 mL  3 mL Intravenous Q12H Donne Hazel, MD   3 mL at 12/29/18 1045  . sodium chloride flush (NS) 0.9 % injection 3 mL  3 mL Intravenous PRN Donne Hazel, MD         Discharge Medications: Please see discharge summary for a list of discharge medications.  Relevant Imaging Results:  Relevant Lab Results:   Additional Information SS# [id number]. Needs IV ancef x8 hours end date 01/23/19  Nila Nephew, LCSW

## 2019-01-01 NOTE — TOC Progression Note (Signed)
Transition of Care [facility]) - Progression Note    Patient Details  Name: Suzanne [name] MRN: [id number] Date of Birth: April 04, 1961  Transition of Care [facility]) CM/SW [place],  Phone Number: [phone] 01/01/2019, 11:02 AM  Clinical Narrative:  Seeking SNF placement with [facility] letter of guarantee as pt has pending Medicaid but otherwise uninsured. (Pt needs IV antibiotics x 4 weeks began 12/26/2018)  pasrr #[id number] A. Referrals made          Expected Discharge Plan and Services           Expected Discharge Date: (unknown)                                     Social Determinants of Health (SDOH) Interventions    Readmission Risk Interventions Readmission Risk Prevention Plan 12/01/2018 11/17/2018  Transportation Screening Complete Complete  PCP or Specialist Appt within 3-5 Days - Not Complete  Not Complete comments - will need pcp set up  [place] or [place] - Complete  Social Work Consult for [place] Planning/Counseling - Complete  Palliative Care Screening - Not Complete  Palliative Care Screening Not Complete Comments - pending- bedside RN states plan to consult palliative this admission  Medication Review ([place]) Complete Complete  PCP or Specialist appointment within 3-5 days of discharge Complete -  [place] or Home Care Consult Complete -  SW Recovery Care/Counseling Consult Complete -  Palliative Care Screening Not Applicable -  Skilled Nursing Facility Complete -  Some recent data might be hidden

## 2019-01-01 NOTE — Progress Notes (Signed)
PROGRESS NOTE    Suzanne [name]  PYP:[id number] DOB: 06-Feb-1961 DOA: 12/25/2018 PCP: Patient, No Pcp Per    Brief Narrative:  58 y.o. 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  Patient was sitting bedside commode and she was not able to stand up and she developed worsening shortness of breath.  In the ED, patient was noted to be febrile with temperature 100, tachycardic.  Labs sodium 136, potassium 3.6, CO2 33, BUN 12 calcium 7.0, alkaline phosphatase 235, albumin 2.3, LDH 244, ferritin 2306, CRP 19, procalcitonin less than 0.10, d-dimer 7.9, fibrinogen more than 800, white blood cell 8.1, hemoglobin 9.6, platelets 214.  BNP 89. Chest x-ray and CTA of the chest  was done and the patient was admitted to the hospital.  Assessment & Plan:   Active Problems:   Bone metastasis (HCC)   Pleural effusion, bilateral   Carcinoma of breast, estrogen receptor positive, stage 4, right (HCC)   Acute on chronic respiratory failure with hypoxia (HCC)   Pneumonia   Respiratory failure, acute (HCC)  Acute on chronic hypoxic respiratory failure: Probably related to pneumonia, possible lymphangitic spread.. Patient presented with worsening hypoxia.  CT scan was negative for pulmonary evaluation but fibrosis and was unable to rule out superimposed infection.   Bilateral pleural effusion was small. SARS coronavirus test neg x 2.  Patient was continued on oxygen, nebulizer, IV steroids Solu-Medrol which has been decreased to 40 mg daily.  She was initially on BiPAP which has been weaned off at this time.  Currently on nasal  cannula oxygen.  Stage IV metastatic breast cancer to bone and lung, pleural effusion; Reportedly, patient is undergoing radiation.  She does have pathological left humerus fracture and is supposed to follow-up with Ortho as outpatient.  She will have to continue radiation as outpatient after discharge.  Pathological fracture of the left humerus.  Will need to follow-up as an outpatient with orthopedics.  Continue on a arm sling.  Anemia of chronic disease likely from malignancy.-Had received blood transfusion as outpatient.  Currently hemoglobin of 8.4 and is stable.  elevated d-dimer:  -CT angio of the chest was negative for PE. LE dopplers reviewed, neg for DVT.  elevated alkaline phosphatase:  -Likely related to metastatic bone disease.  Chronic diastolic Heart failure;  Patient received IV Lasix on admission.  BNP within normal limits.  On nasal cannula oxygen.  Currently compensated.  MSSA bacteremia -PICC line was placed on 12/30/2018.  Patient has been seen by ID who recommended total 4 weeks of antibiotic.  Repeat blood cultures have been negative.  2D echocardiogram was unremarkable.  Patient remains afebrile without leukocytosis.  L and R buttock pressure injury, present on admission, stage 2 Continue wound care.  DVT prophylaxis: Lovenox subQ  Code Status: Full code  Family Communication: None  Disposition Plan: Likely SNF, versus home with home health.  Social worker on board regarding disposition.  Patient is medically stable for disposition, pending arrangement with IV antibiotics and physical therapy.  Consultants:   Oncology  ID  Procedures:   LE dopplers 7/31, neg for dvt  2d echo 7/31  PICC line placement on 12/30/2018  Antimicrobials: Anti-infectives (From admission, onward)   Start     Dose/Rate Route Frequency Ordered Stop   12/31/18 0000  ceFAZolin (ANCEF) IVPB     2 g Intravenous Every 8 hours 12/31/18 1554 01/23/19 2359   12/26/18 0600   ceFAZolin (ANCEF) IVPB 2g/100 mL premix     2 g 200 mL/hr over 30 Minutes Intravenous Every 8 hours 12/26/18 0445     12/25/18 2300  vancomycin (VANCOCIN) IVPB 1000 mg/200 mL premix  Status:  Discontinued     1,000 mg 200 mL/hr over 60 Minutes Intravenous Every 12 hours 12/25/18 1101 12/26/18 0444   12/25/18 2200  ceFEPIme (MAXIPIME) 2 g in sodium chloride 0.9 % 100 mL IVPB  Status:  Discontinued     2 g 200 mL/hr over 30 Minutes Intravenous Every 8 hours 12/25/18 1447 12/26/18 0444   12/25/18 1515  ceFEPIme (MAXIPIME) 1 g in sodium chloride 0.9 % 100 mL IVPB     1 g 200 mL/hr over 30 Minutes Intravenous  Once 12/25/18 1446 12/25/18 1728   12/25/18 1115  vancomycin (VANCOCIN) 2,000 mg in sodium chloride 0.9 % 500 mL IVPB     2,000 mg 250 mL/hr over 120 Minutes Intravenous  Once 12/25/18 1101 12/25/18 1405   12/25/18 1045  ceFEPIme (MAXIPIME) 1 g in sodium chloride 0.9 % 100 mL IVPB     1 g 200 mL/hr over 30 Minutes Intravenous  Once 12/25/18 1022 12/25/18 1207      Subjective: Patient denies interval complaints.  Denies nausea, vomiting or abdominal pain.  Her breathing seems to be stable.  No chest pain, palpitation or fever.  Complains of mild cough.  Objective: Vitals:   12/31/18 0427 12/31/18 1404 12/31/18 2042 01/01/19 0441  BP: 140/62 130/70 133/76 140/70  Pulse: 87 (!) 107 92 100  Resp: 19 16 20 20   Temp: 98 F (36.7 C) 97.9 F (36.6 C) 98.4 F (36.9 C)   TempSrc: Oral Oral Oral   SpO2: 100% 99% 100% 100%  Weight:      Height:        Intake/Output Summary (Last 24 hours) at 01/01/2019 0943 Last data filed at 01/01/2019 0415 Gross per 24 hour  Intake 1026.47 ml  Output 1000 ml  Net 26.47 ml   Filed Weights   12/27/18 0316 12/28/18 0629 12/29/18 0518  Weight: 90.4 kg 88.2 kg 88.9 kg   Body mass index is 34.72 kg/m.   Physical examination: General: Obese, not in obvious distress, on nasal cannula oxygen. HENT: Normocephalic, pupils equally reacting to light and  accommodation.  No scleral pallor or icterus noted. Oral mucosa is moist.  Chest:   Diminished breath sounds bilaterally. No crackles or wheezes.  CVS: S1 &S2 heard. No murmur.  Regular rate and rhythm. Abdomen: Soft, nontender, nondistended.  Bowel sounds are heard.  Liver is not palpable, no abdominal mass palpated Extremities: No cyanosis, clubbing or edema.  Peripheral pulses are palpable.  Left upper extremity on a sling.  Right upper extremity with PICC line in place. Psych: Alert, awake and oriented, normal mood CNS:  No cranial nerve deficits.  Power equal in all extremities.  No sensory deficits noted.  No cerebellar signs.   Skin: Warm and dry.  Stage II sacral pressure ulceration, present on admission.   Data Reviewed: I have personally reviewed following labs and imaging studies  CBC: Recent Labs  Lab 12/25/18 1009 12/26/18 0235 12/27/18 0213 12/31/18 0341 01/01/19 0351  WBC 8.1 6.6 8.1 13.0*  13.1*  NEUTROABS 5.0  --   --   --   --   HGB 9.6* 8.2* 8.8* 8.6* 8.4*  HCT 31.6* 27.8* 29.6* 29.2* 29.4*  MCV 104.6* 106.1* 106.9* 109.0* 110.1*  PLT 214 226 282 359 161   Basic Metabolic Panel: Recent Labs  Lab 12/25/18 1009 12/26/18 0235 12/27/18 0213 12/31/18 0341 01/01/19 0351  NA 136 139 139 140 138  K 3.6 3.2* 4.1 4.4 3.9  CL 92* 96* 96* 99 100  CO2 33* 35* 34* 33* 35*  GLUCOSE 159* 172* 173* 120* 94  BUN 12 13 17 20 14   CREATININE 0.40* 0.44 0.46 0.39* 0.39*  CALCIUM 7.0* 6.1* 7.0* 7.7* 7.9*  MG  --   --   --   --  2.3  PHOS  --   --   --   --  2.4*   GFR: Estimated Creatinine Clearance: 80.1 mL/min (A) (by C-G formula based on SCr of 0.39 mg/dL (L)). Liver Function Tests: Recent Labs  Lab 12/25/18 1009 01/01/19 0351  AST 24 17  ALT 35 12  ALKPHOS 235* 199*  BILITOT 0.6 0.4  PROT 7.0 5.4*  ALBUMIN 2.3* 2.3*   No results for input(s): LIPASE, AMYLASE in the last 168 hours. No results for input(s): AMMONIA in the last 168 hours. Coagulation Profile:  No results for input(s): INR, PROTIME in the last 168 hours. Cardiac Enzymes: No results for input(s): CKTOTAL, CKMB, CKMBINDEX, TROPONINI in the last 168 hours. BNP (last 3 results) No results for input(s): PROBNP in the last 8760 hours. HbA1C: No results for input(s): HGBA1C in the last 72 hours. CBG: No results for input(s): GLUCAP in the last 168 hours. Lipid Profile: No results for input(s): CHOL, HDL, LDLCALC, TRIG, CHOLHDL, LDLDIRECT in the last 72 hours. Thyroid Function Tests: No results for input(s): TSH, T4TOTAL, FREET4, T3FREE, THYROIDAB in the last 72 hours. Anemia Panel: No results for input(s): VITAMINB12, FOLATE, FERRITIN, TIBC, IRON, RETICCTPCT in the last 72 hours. Sepsis Labs: Recent Labs  Lab 12/25/18 1009  PROCALCITON <0.10  LATICACIDVEN 1.0    Recent Results (from the past 240 hour(s))  SARS Coronavirus 2 [facility] order, Performed in [facility] hospital lab)     Status: None   Collection Time: 12/25/18 10:09 AM   Specimen: Nasopharyngeal Swab  Result Value Ref Range Status   SARS Coronavirus 2 NEGATIVE NEGATIVE Final    Comment: (NOTE) If result is NEGATIVE SARS-CoV-2 target nucleic acids are NOT DETECTED. The SARS-CoV-2 RNA is generally detectable in upper and lower  respiratory specimens during the acute phase of infection. The lowest  concentration of SARS-CoV-2 viral copies this assay can detect is 250  copies / mL. A negative result does not preclude SARS-CoV-2 infection  and should not be used as the sole basis for treatment or other  patient management decisions.  A negative result may occur with  improper specimen collection / handling, submission of specimen other  than nasopharyngeal swab, presence of viral mutation(s) within the  areas targeted by this assay, and inadequate number of viral copies  (<250 copies / mL). A negative result must be combined with clinical  observations, patient history, and epidemiological information. If result  is POSITIVE SARS-CoV-2 target nucleic acids are DETECTED. The SARS-CoV-2 RNA is generally detectable in upper and lower  respiratory specimens dur ing the acute phase of infection.  Positive  results are indicative of active infection with SARS-CoV-2.  Clinical  correlation with patient history and other diagnostic  information is  necessary to determine patient infection status.  Positive results do  not rule out bacterial infection or co-infection with other viruses. If result is PRESUMPTIVE POSTIVE SARS-CoV-2 nucleic acids MAY BE PRESENT.   A presumptive positive result was obtained on the submitted specimen  and confirmed on repeat testing.  While 2019 novel coronavirus  (SARS-CoV-2) nucleic acids may be present in the submitted sample  additional confirmatory testing may be necessary for epidemiological  and / or clinical management purposes  to differentiate between  SARS-CoV-2 and other Sarbecovirus currently known to infect humans.  If clinically indicated additional testing with an alternate test  methodology [phone]) is advised. The SARS-CoV-2 RNA is generally  detectable in upper and lower respiratory sp ecimens during the acute  phase of infection. The expected result is Negative. Fact Sheet for Patients:  StrictlyIdeas.no Fact Sheet for Healthcare Providers: BankingDealers.co.za This test is not yet approved or cleared by the [place] FDA and has been authorized for detection and/or diagnosis of SARS-CoV-2 by FDA under an Emergency Use Authorization (EUA).  This EUA will remain in effect (meaning this test can be used) for the duration of the COVID-19 declaration under Section 564(b)(1) of the Act, 21 U.S.C. section 360bbb-3(b)(1), unless the authorization is terminated or revoked sooner. Performed at [facility], [place] [address], [place], [place] [address]   Blood Culture (routine x 2)     Status:  Abnormal   Collection Time: 12/25/18 10:09 AM   Specimen: BLOOD  Result Value Ref Range Status   Specimen Description   Final    BLOOD RIGHT ANTECUBITAL Performed at [facility] [address], [place], [place] [address]    Special Requests   Final    BOTTLES DRAWN AEROBIC AND ANAEROBIC Blood Culture adequate volume Performed at [facility] [address], [place], [place] [address]    Culture  Setup Time   Final    GRAM POSITIVE COCCI IN CLUSTERS IN BOTH AEROBIC AND ANAEROBIC BOTTLES CRITICAL RESULT CALLED TO, READ BACK BY AND VERIFIED WITH: Audrea Muscat [id number] 12/26/2018 Mena Goes Performed at [facility], [place] [address], [place],  [address]    Culture STAPHYLOCOCCUS AUREUS (A)  Final   Report Status 12/28/2018 FINAL  Final   Organism ID, Bacteria STAPHYLOCOCCUS AUREUS  Final      Susceptibility   Staphylococcus aureus - MIC*    CIPROFLOXACIN <=0.5 SENSITIVE Sensitive     ERYTHROMYCIN 1 INTERMEDIATE Intermediate     GENTAMICIN <=0.5 SENSITIVE Sensitive     OXACILLIN <=0.25 SENSITIVE Sensitive     TETRACYCLINE <=1 SENSITIVE Sensitive     VANCOMYCIN <=0.5 SENSITIVE Sensitive     TRIMETH/SULFA <=10 SENSITIVE Sensitive     CLINDAMYCIN RESISTANT Resistant     RIFAMPIN <=0.5 SENSITIVE Sensitive     Inducible Clindamycin POSITIVE Resistant     * STAPHYLOCOCCUS AUREUS  Blood Culture ID Panel (Reflexed)     Status: Abnormal   Collection Time: 12/25/18 10:09 AM  Result Value Ref Range Status   Enterococcus species NOT DETECTED NOT DETECTED Final   Listeria monocytogenes NOT DETECTED NOT DETECTED Final   Staphylococcus species DETECTED (A) NOT DETECTED Final    Comment: CRITICAL RESULT CALLED TO, READ BACK BY AND VERIFIED WITH: J. GRIMSLEY,PHARMD [id number] 12/26/2018 T. TYSOR    Staphylococcus aureus (BCID) DETECTED (A) NOT DETECTED Final    Comment: Methicillin (oxacillin) susceptible Staphylococcus aureus (MSSA). Preferred  therapy is anti staphylococcal beta  lactam antibiotic (Cefazolin or Nafcillin), unless clinically contraindicated. CRITICAL RESULT CALLED TO, READ BACK BY AND VERIFIED WITH: J. Fabiola Backer [id number] 12/26/2018 T. TYSOR    Methicillin resistance NOT DETECTED NOT DETECTED Final   Streptococcus species NOT DETECTED NOT DETECTED Final   Streptococcus agalactiae NOT DETECTED NOT DETECTED Final   Streptococcus pneumoniae NOT DETECTED NOT DETECTED Final   Streptococcus pyogenes NOT DETECTED NOT DETECTED Final   Acinetobacter baumannii NOT DETECTED NOT DETECTED Final   Enterobacteriaceae species NOT DETECTED NOT DETECTED Final   Enterobacter cloacae complex NOT DETECTED NOT DETECTED Final   Escherichia coli NOT DETECTED NOT DETECTED Final   Klebsiella oxytoca NOT DETECTED NOT DETECTED Final   Klebsiella pneumoniae NOT DETECTED NOT DETECTED Final   Proteus species NOT DETECTED NOT DETECTED Final   Serratia marcescens NOT DETECTED NOT DETECTED Final   Haemophilus influenzae NOT DETECTED NOT DETECTED Final   Neisseria meningitidis NOT DETECTED NOT DETECTED Final   Pseudomonas aeruginosa NOT DETECTED NOT DETECTED Final   Candida albicans NOT DETECTED NOT DETECTED Final   Candida glabrata NOT DETECTED NOT DETECTED Final   Candida krusei NOT DETECTED NOT DETECTED Final   Candida parapsilosis NOT DETECTED NOT DETECTED Final   Candida tropicalis NOT DETECTED NOT DETECTED Final    Comment: Performed at [facility], [address] [place], [place] [address]  Blood Culture (routine x 2)     Status: Abnormal   Collection Time: 12/25/18 10:15 AM   Specimen: BLOOD LEFT HAND  Result Value Ref Range Status   Specimen Description   Final    BLOOD LEFT HAND Performed at [facility] [address] [place], [place] [address]    Special Requests   Final    BOTTLES DRAWN AEROBIC AND ANAEROBIC Blood Culture results may not be optimal due to an inadequate volume of blood received in  culture bottles Performed at [facility] [address] [place], [place] [address]    Culture  Setup Time   Final    GRAM POSITIVE COCCI IN CLUSTERS IN BOTH AEROBIC AND ANAEROBIC BOTTLES CRITICAL VALUE NOTED.  VALUE IS CONSISTENT WITH PREVIOUSLY REPORTED AND CALLED VALUE.    Culture (A)  Final    STAPHYLOCOCCUS AUREUS SUSCEPTIBILITIES PERFORMED ON PREVIOUS CULTURE WITHIN THE LAST 5 DAYS. Performed at [facility], [place] [address] [place], [place] [address]    Report Status 12/28/2018 FINAL  Final  SARS Coronavirus 2 (CEPHEID - Performed in [facility] hospital lab), Hosp Order     Status: None   Collection Time: 12/25/18  2:44 PM   Specimen: Nasopharyngeal Swab  Result Value Ref Range Status   SARS Coronavirus 2 NEGATIVE NEGATIVE Final    Comment: (NOTE) If result is NEGATIVE SARS-CoV-2 target nucleic acids are NOT DETECTED. The SARS-CoV-2 RNA is generally detectable in upper and lower  respiratory specimens during the acute phase of infection. The lowest  concentration of SARS-CoV-2 viral copies this assay can detect is 250  copies / mL. A negative result does not preclude SARS-CoV-2 infection  and should not be used as the sole basis for treatment or other  patient management decisions.  A negative result may occur with  improper specimen collection / handling, submission of specimen other  than nasopharyngeal swab, presence of viral mutation(s) within the  areas targeted by this assay, and inadequate number of viral copies  (<250 copies / mL). A negative result must be combined with clinical  observations, patient history,  and epidemiological information. If result is POSITIVE SARS-CoV-2 target nucleic acids are DETECTED. The SARS-CoV-2 RNA is generally detectable in upper and lower  respiratory specimens dur ing the acute phase of infection.  Positive  results are indicative of active infection with SARS-CoV-2.  Clinical  correlation with patient  history and other diagnostic information is  necessary to determine patient infection status.  Positive results do  not rule out bacterial infection or co-infection with other viruses. If result is PRESUMPTIVE POSTIVE SARS-CoV-2 nucleic acids MAY BE PRESENT.   A presumptive positive result was obtained on the submitted specimen  and confirmed on repeat testing.  While 2019 novel coronavirus  (SARS-CoV-2) nucleic acids may be present in the submitted sample  additional confirmatory testing may be necessary for epidemiological  and / or clinical management purposes  to differentiate between  SARS-CoV-2 and other Sarbecovirus currently known to infect humans.  If clinically indicated additional testing with an alternate test  methodology [phone]) is advised. The SARS-CoV-2 RNA is generally  detectable in upper and lower respiratory sp ecimens during the acute  phase of infection. The expected result is Negative. Fact Sheet for Patients:  StrictlyIdeas.no Fact Sheet for Healthcare Providers: BankingDealers.co.za This test is not yet approved or cleared by the [place] FDA and has been authorized for detection and/or diagnosis of SARS-CoV-2 by FDA under an Emergency Use Authorization (EUA).  This EUA will remain in effect (meaning this test can be used) for the duration of the COVID-19 declaration under Section 564(b)(1) of the Act, 21 U.S.C. section 360bbb-3(b)(1), unless the authorization is terminated or revoked sooner. Performed at [facility], [place] [address] [place], [place] [address]   MRSA PCR Screening     Status: None   Collection Time: 12/25/18  5:16 PM   Specimen: Nasal Mucosa; Nasopharyngeal  Result Value Ref Range Status   MRSA by PCR NEGATIVE NEGATIVE Final    Comment:        The GeneXpert MRSA Assay (FDA approved for NASAL specimens only), is one component of a comprehensive MRSA colonization  surveillance program. It is not intended to diagnose MRSA infection nor to guide or monitor treatment for MRSA infections. Performed at [facility], [place] [address], [place], [place] [address]   Culture, blood (routine x 2)     Status: None   Collection Time: 12/26/18  5:36 PM   Specimen: BLOOD  Result Value Ref Range Status   Specimen Description   Final    BLOOD RIGHT ANTECUBITAL Performed at [facility] [address], [place], [place] [address]    Special Requests   Final    BOTTLES DRAWN AEROBIC ONLY Blood Culture adequate volume Performed at [facility] [address] [place], [place] [address]    Culture   Final    NO GROWTH 5 DAYS Performed at [facility], [place] [address] [place], [place] [address]    Report Status 12/31/2018 FINAL  Final  Culture, blood (routine x 2)     Status: None   Collection Time: 12/26/18  5:36 PM   Specimen: BLOOD RIGHT HAND  Result Value Ref Range Status   Specimen Description   Final    BLOOD RIGHT HAND Performed at [facility] [address] [place] [address], [place], [place] [address]    Special Requests   Final    BOTTLES DRAWN AEROBIC ONLY Blood Culture adequate volume Performed at [facility]  [address] [place], [place] [address]    Culture   Final    NO GROWTH 5 DAYS Performed at [facility], [place] [address], [place], [place] [address]    Report Status 12/31/2018 FINAL  Final     Radiology Studies: No results found.  Scheduled Meds: . anastrozole  1 mg Oral Daily  . calcium-vitamin D  1 tablet Oral TID  . carvedilol  6.25 mg Oral BID WC  . chlorhexidine  15 mL Mouth Rinse BID  . Chlorhexidine Gluconate Cloth  6 each Topical Q0600  . docusate sodium  100 mg Oral BID  . enoxaparin (LOVENOX) injection  40 mg Subcutaneous Q24H  . feeding supplement (ENSURE ENLIVE)  237 mL Oral BID BM  . feeding supplement (PRO-STAT SUGAR  FREE 64)  30 mL Oral TID BM  . loratadine  10 mg Oral Daily  . mouth rinse  15 mL Mouth Rinse q12n4p  . methylPREDNISolone (SOLU-MEDROL) injection  40 mg Intravenous Daily  . multivitamin with minerals  1 tablet Oral Daily  . nystatin   Topical TID  . sodium chloride flush  3 mL Intravenous Q12H   Continuous Infusions: . sodium chloride    .  ceFAZolin (ANCEF) IV 2 g (01/01/19 0523)     LOS: 7 days   Flora Lipps, MD [facility] Pager On Amion  If 7PM-7AM, please contact night-coverage 01/01/2019, 9:43 AM

## 2019-01-02 LAB — BASIC METABOLIC PANEL
Anion gap: 7 (ref 5–15)
BUN: 18 mg/dL (ref 6–20)
CO2: 32 mmol/L (ref 22–32)
Calcium: 8.1 mg/dL — ABNORMAL LOW (ref 8.9–10.3)
Chloride: 100 mmol/L (ref 98–111)
Creatinine, Ser: 0.36 mg/dL — ABNORMAL LOW (ref 0.44–1.00)
GFR calc Af Amer: >60 mL/min (ref >60)
GFR calc non Af Amer: >60 mL/min (ref >60)
Glucose, Bld: 107 mg/dL — ABNORMAL HIGH (ref 70–99)
Potassium: 4.1 mmol/L (ref 3.5–5.1)
Sodium: 139 mmol/L (ref 135–145)

## 2019-01-02 LAB — CBC
HCT: 28.1 % — ABNORMAL LOW (ref 36.0–46.0)
Hemoglobin: 8 g/dL — ABNORMAL LOW (ref 12.0–15.0)
MCH: 31.5 pg (ref 26.0–34.0)
MCHC: 28.5 g/dL — ABNORMAL LOW (ref 30.0–36.0)
MCV: 110.6 fL — ABNORMAL HIGH (ref 80.0–100.0)
Platelets: 372 10*3/uL (ref 150–400)
RBC: 2.54 MIL/uL — ABNORMAL LOW (ref 3.87–5.11)
RDW: 21.1 % — ABNORMAL HIGH (ref 11.5–15.5)
WBC: 11.2 10*3/uL — ABNORMAL HIGH (ref 4.0–10.5)
nRBC: 2.8 % — ABNORMAL HIGH (ref 0.0–0.2)

## 2019-01-02 MED ORDER — ALBUTEROL SULFATE (2.5 MG/3ML) 0.083% IN NEBU
2.5000 mg | INHALATION_SOLUTION | Freq: Three times a day (TID) | RESPIRATORY_TRACT | Status: DC | PRN
  Administered 2019-01-07 – 2019-01-08 (×2): 2.5 mg via RESPIRATORY_TRACT
  Filled 2019-01-02 (×2): qty 3

## 2019-01-02 NOTE — Progress Notes (Addendum)
PROGRESS NOTE    Suzanne [name]  INO:[id number] DOB: Mar 27, 1961 DOA: 12/25/2018 PCP: Patient, No Pcp Per    Brief Narrative:  58 y.o. 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  Patient was sitting bedside commode and she was not able to stand up and she developed worsening shortness of breath.  In the ED, patient was noted to be febrile with temperature 100, tachycardic.  Labs revealed sodium 136, potassium 3.6, CO2 33, BUN 12 calcium 7.0, alkaline phosphatase 235, albumin 2.3, LDH 244, ferritin 2306, CRP 19, procalcitonin less than 0.10, d-dimer 7.9, fibrinogen more than 800, white blood cell 8.1, hemoglobin 9.6, platelets 214.  BNP 89. Chest x-ray and CTA of the chest  was done and the patient was admitted to the hospital.  Assessment & Plan:   Principal Problem:   Acute on chronic respiratory failure with hypoxia (HCC) Active Problems:   Bone metastasis (HCC)   Pleural effusion, bilateral   Carcinoma of breast, estrogen receptor positive, stage 4, right (HCC)   Pressure injury of skin   Pneumonia   Respiratory failure, acute (HCC)  Acute on chronic hypoxic respiratory failure: Probably related to pneumonia, possible lymphangitic spread.. Patient presented with worsening hypoxia.  CT scan was negative for pulmonary evaluation but fibrosis and was unable to rule out superimposed infection.   Bilateral pleural effusion was small. SARS coronavirus test neg x 2.  Patient was continued on oxygen, nebulizer, IV steroids Solu-Medrol which has been decreased to 40 mg daily.  She was initially on BiPAP which  has been weaned off at this time.  Currently on nasal cannula oxygen.  Clinically stable at this time  Stage IV metastatic breast cancer to bone and lung, pleural effusion; Reportedly, patient is undergoing radiation.  She does have pathological left humerus fracture and is supposed to follow-up with Ortho as outpatient.  She will have to continue palliative radiation as outpatient after discharge.  Pathological fracture of the left humerus.  Will need to follow-up as an outpatient with orthopedics.  Continue on a arm sling.  Anemia of chronic disease likely from malignancy.-Had received blood transfusion as outpatient.  Currently hemoglobin of 8.0 and is stable.  elevated d-dimer:  -CT angio of the chest was negative for PE. LE dopplers reviewed, neg for DVT.  elevated alkaline phosphatase:  -Secondary to metastatic bone disease.  Chronic diastolic Heart failure;  Patient received IV Lasix on admission.  BNP within normal limits.  On nasal cannula oxygen.  Currently compensated.  No weight gain noted  MSSA bacteremia -PICC line was placed on 12/30/2018.  Patient has been seen by ID who recommended total 4 weeks of antibiotic.  Repeat blood cultures have been negative.  2D echocardiogram was unremarkable.  Low-grade fever with leukocytosis 11.2 today.  Will monitor  L and R buttock pressure injury, present on admission, stage 2 Continue wound care.  DVT prophylaxis: Lovenox subQ  Code Status: Full code  Family Communication: I spoke with the patient's daughter Ms. Amazing Cowman on the home phone and updated daughter about the clinical condition of the patient.  Disposition Plan: Likely SNF versus home with home health.  Social worker  on board regarding disposition.  Patient is medically stable for disposition, pending arrangement with IV antibiotics and physical therapy.  Consultants:   Oncology  ID  Procedures:   LE dopplers 7/31, neg for dvt  2d echo 7/31  PICC line  placement on 12/30/2018   Antimicrobials:  Ancef 7/31> to complete 4 weeks  Anti-infectives (From admission, onward)   Start     Dose/Rate Route Frequency Ordered Stop   12/31/18 0000  ceFAZolin (ANCEF) IVPB     2 g Intravenous Every 8 hours 12/31/18 1554 01/23/19 2359   12/26/18 0600  ceFAZolin (ANCEF) IVPB 2g/100 mL premix     2 g 200 mL/hr over 30 Minutes Intravenous Every 8 hours 12/26/18 0445     12/25/18 2300  vancomycin (VANCOCIN) IVPB 1000 mg/200 mL premix  Status:  Discontinued     1,000 mg 200 mL/hr over 60 Minutes Intravenous Every 12 hours 12/25/18 1101 12/26/18 0444   12/25/18 2200  ceFEPIme (MAXIPIME) 2 g in sodium chloride 0.9 % 100 mL IVPB  Status:  Discontinued     2 g 200 mL/hr over 30 Minutes Intravenous Every 8 hours 12/25/18 1447 12/26/18 0444   12/25/18 1515  ceFEPIme (MAXIPIME) 1 g in sodium chloride 0.9 % 100 mL IVPB     1 g 200 mL/hr over 30 Minutes Intravenous  Once 12/25/18 1446 12/25/18 1728   12/25/18 1115  vancomycin (VANCOCIN) 2,000 mg in sodium chloride 0.9 % 500 mL IVPB     2,000 mg 250 mL/hr over 120 Minutes Intravenous  Once 12/25/18 1101 12/25/18 1405   12/25/18 1045  ceFEPIme (MAXIPIME) 1 g in sodium chloride 0.9 % 100 mL IVPB     1 g 200 mL/hr over 30 Minutes Intravenous  Once 12/25/18 1022 12/25/18 1207      Subjective: Patient denies interval complaints.  Denies chest pain, shortness of breath, cough, fever or chills.  Objective: Vitals:   01/01/19 1328 01/01/19 1957 01/02/19 0500 01/02/19 0606  BP: 139/63 137/64  (!) 141/58  Pulse: 89 96  86  Resp: 18 18  18   Temp: 98.8 F (37.1 C) 99.2 F (37.3 C)  98.5 F (36.9 C)  TempSrc: Oral Oral  Oral  SpO2: 98% 100%  100%  Weight:   85.3 kg   Height:        Intake/Output Summary (Last 24 hours) at 01/02/2019 1033 Last data filed at 01/02/2019 0843 Gross per 24 hour  Intake 979.85 ml  Output 1200 ml  Net -220.15 ml   Filed Weights   12/28/18 0629 12/29/18 0518 01/02/19 0500  Weight:  88.2 kg 88.9 kg 85.3 kg   Body mass index is 33.32 kg/m.   Physical examination: General: Obese, not in obvious distress.  On nasal cannula oxygen. HENT: Normocephalic, pupils equally reacting to light and accommodation.  No scleral pallor or icterus noted. Oral mucosa is moist.  Chest:   Diminished breath sounds bilaterally.  No obvious crackles or wheezes noted. CVS: S1 &S2 heard. No murmur.  Regular rate and rhythm. Abdomen: Soft, nontender, nondistended.  Bowel sounds are heard.  Liver is not palpable, no abdominal mass palpated Extremities: No cyanosis, clubbing or edema.  Peripheral pulses are palpable.  Left upper extremity on a sling.  Right upper extremity with PICC line. Psych: Alert, awake and oriented, normal mood CNS:  No cranial nerve deficits.  Power equal in all extremities.  No sensory deficits noted.  No cerebellar signs.   Skin: Warm and dry.  Stage II sacral ulcer, present on admission.    Data Reviewed: I have personally reviewed following labs and imaging studies  CBC: Recent Labs  Lab 12/27/18 0213 12/31/18 0341 01/01/19 0351 01/02/19 0326  WBC 8.1 13.0* 13.1* 11.2*  HGB 8.8* 8.6* 8.4* 8.0*  HCT 29.6* 29.2* 29.4* 28.1*  MCV 106.9* 109.0* 110.1* 110.6*  PLT 282 359 368 413   Basic Metabolic Panel: Recent Labs  Lab 12/27/18 0213 12/31/18 0341 01/01/19 0351 01/02/19 0326  NA 139 140 138 139  K 4.1 4.4 3.9 4.1  CL 96* 99 100 100  CO2 34* 33* 35* 32  GLUCOSE 173* 120* 94 107*  BUN 17 20 14 18   CREATININE 0.46 0.39* 0.39* 0.36*  CALCIUM 7.0* 7.7* 7.9* 8.1*  MG  --   --  2.3  --   PHOS  --   --  2.4*  --    GFR: Estimated Creatinine Clearance: 78.4 mL/min (A) (by C-G formula based on SCr of 0.36 mg/dL (L)). Liver Function Tests: Recent Labs  Lab 01/01/19 0351  AST 17  ALT 12  ALKPHOS 199*  BILITOT 0.4  PROT 5.4*  ALBUMIN 2.3*   No results for input(s): LIPASE, AMYLASE in the last 168 hours. No results for input(s): AMMONIA in the last  168 hours. Coagulation Profile: No results for input(s): INR, PROTIME in the last 168 hours. Cardiac Enzymes: No results for input(s): CKTOTAL, CKMB, CKMBINDEX, TROPONINI in the last 168 hours. BNP (last 3 results) No results for input(s): PROBNP in the last 8760 hours. HbA1C: No results for input(s): HGBA1C in the last 72 hours. CBG: No results for input(s): GLUCAP in the last 168 hours. Lipid Profile: No results for input(s): CHOL, HDL, LDLCALC, TRIG, CHOLHDL, LDLDIRECT in the last 72 hours. Thyroid Function Tests: No results for input(s): TSH, T4TOTAL, FREET4, T3FREE, THYROIDAB in the last 72 hours. Anemia Panel: No results for input(s): VITAMINB12, FOLATE, FERRITIN, TIBC, IRON, RETICCTPCT in the last 72 hours. Sepsis Labs: No results for input(s): PROCALCITON, LATICACIDVEN in the last 168 hours.  Recent Results (from the past 240 hour(s))  SARS Coronavirus 2 [facility] order, Performed in [place] hospital lab)     Status: None   Collection Time: 12/25/18 10:09 AM   Specimen: Nasopharyngeal Swab  Result Value Ref Range Status   SARS Coronavirus 2 NEGATIVE NEGATIVE Final    Comment: (NOTE) If result is NEGATIVE SARS-CoV-2 target nucleic acids are NOT DETECTED. The SARS-CoV-2 RNA is generally detectable in upper and lower  respiratory specimens during the acute phase of infection. The lowest  concentration of SARS-CoV-2 viral copies this assay can detect is 250  copies / mL. A negative result does not preclude SARS-CoV-2 infection  and should not be used as the sole basis for treatment or other  patient management decisions.  A negative result may occur with  improper specimen collection / handling, submission of specimen other  than nasopharyngeal swab, presence of viral mutation(s) within the  areas targeted by this assay, and inadequate number of viral copies  (<250 copies / mL). A negative result must be combined with clinical  observations, patient history, and  epidemiological information. If result is POSITIVE SARS-CoV-2 target nucleic acids are DETECTED. The SARS-CoV-2 RNA is generally detectable in upper and lower  respiratory specimens dur ing the acute phase of infection.  Positive  results are indicative of active infection with SARS-CoV-2.  Clinical  correlation with patient history and other diagnostic information is  necessary to determine  patient infection status.  Positive results do  not rule out bacterial infection or co-infection with other viruses. If result is PRESUMPTIVE POSTIVE SARS-CoV-2 nucleic acids MAY BE PRESENT.   A presumptive positive result was obtained on the submitted specimen  and confirmed on repeat testing.  While 2019 novel coronavirus  (SARS-CoV-2) nucleic acids may be present in the submitted sample  additional confirmatory testing may be necessary for epidemiological  and / or clinical management purposes  to differentiate between  SARS-CoV-2 and other Sarbecovirus currently known to infect humans.  If clinically indicated additional testing with an alternate test  methodology [phone]) is advised. The SARS-CoV-2 RNA is generally  detectable in upper and lower respiratory sp ecimens during the acute  phase of infection. The expected result is Negative. Fact Sheet for Patients:  StrictlyIdeas.no Fact Sheet for Healthcare Providers: BankingDealers.co.za This test is not yet approved or cleared by the [place] FDA and has been authorized for detection and/or diagnosis of SARS-CoV-2 by FDA under an Emergency Use Authorization (EUA).  This EUA will remain in effect (meaning this test can be used) for the duration of the COVID-19 declaration under Section 564(b)(1) of the Act, 21 U.S.C. section 360bbb-3(b)(1), unless the authorization is terminated or revoked sooner. Performed at [facility], [place] [address] [place], [place] [address]    Blood Culture (routine x 2)     Status: Abnormal   Collection Time: 12/25/18 10:09 AM   Specimen: BLOOD  Result Value Ref Range Status   Specimen Description   Final    BLOOD RIGHT ANTECUBITAL Performed at [facility] [address], [place], [place] [address]    Special Requests   Final    BOTTLES DRAWN AEROBIC AND ANAEROBIC Blood Culture adequate volume Performed at [facility] [address] [place],  [address]    Culture  Setup Time   Final    GRAM POSITIVE COCCI IN CLUSTERS IN BOTH AEROBIC AND ANAEROBIC BOTTLES CRITICAL RESULT CALLED TO, READ BACK BY AND VERIFIED WITH: Audrea Muscat [id number] 12/26/2018 Mena Goes Performed at [facility], [place] [address] [place],  [address]    Culture STAPHYLOCOCCUS AUREUS (A)  Final   Report Status 12/28/2018 FINAL  Final   Organism ID, Bacteria STAPHYLOCOCCUS AUREUS  Final      Susceptibility   Staphylococcus aureus - MIC*    CIPROFLOXACIN <=0.5 SENSITIVE Sensitive     ERYTHROMYCIN 1 INTERMEDIATE Intermediate     GENTAMICIN <=0.5 SENSITIVE Sensitive     OXACILLIN <=0.25 SENSITIVE Sensitive     TETRACYCLINE <=1 SENSITIVE Sensitive     VANCOMYCIN <=0.5 SENSITIVE Sensitive     TRIMETH/SULFA <=10 SENSITIVE Sensitive     CLINDAMYCIN RESISTANT Resistant     RIFAMPIN <=0.5 SENSITIVE Sensitive     Inducible Clindamycin POSITIVE Resistant     * STAPHYLOCOCCUS AUREUS  Blood Culture ID Panel (Reflexed)     Status: Abnormal   Collection Time: 12/25/18 10:09 AM  Result Value Ref Range Status   Enterococcus species NOT DETECTED NOT DETECTED Final   Listeria monocytogenes NOT DETECTED NOT DETECTED Final   Staphylococcus species DETECTED (A) NOT DETECTED Final    Comment: CRITICAL RESULT CALLED TO, READ BACK BY AND VERIFIED WITH: J. GRIMSLEY,PHARMD [id number] 12/26/2018 T. TYSOR    Staphylococcus aureus (BCID) DETECTED (A) NOT DETECTED Final    Comment: Methicillin (oxacillin) susceptible  Staphylococcus aureus (MSSA). Preferred therapy is anti staphylococcal beta lactam antibiotic (Cefazolin or Nafcillin), unless  clinically contraindicated. CRITICAL RESULT CALLED TO, READ BACK BY AND VERIFIED WITH: J. Fabiola Backer [id number] 12/26/2018 T. TYSOR    Methicillin resistance NOT DETECTED NOT DETECTED Final   Streptococcus species NOT DETECTED NOT DETECTED Final   Streptococcus agalactiae NOT DETECTED NOT DETECTED Final   Streptococcus pneumoniae NOT DETECTED NOT DETECTED Final   Streptococcus pyogenes NOT DETECTED NOT DETECTED Final   Acinetobacter baumannii NOT DETECTED NOT DETECTED Final   Enterobacteriaceae species NOT DETECTED NOT DETECTED Final   Enterobacter cloacae complex NOT DETECTED NOT DETECTED Final   Escherichia coli NOT DETECTED NOT DETECTED Final   Klebsiella oxytoca NOT DETECTED NOT DETECTED Final   Klebsiella pneumoniae NOT DETECTED NOT DETECTED Final   Proteus species NOT DETECTED NOT DETECTED Final   Serratia marcescens NOT DETECTED NOT DETECTED Final   Haemophilus influenzae NOT DETECTED NOT DETECTED Final   Neisseria meningitidis NOT DETECTED NOT DETECTED Final   Pseudomonas aeruginosa NOT DETECTED NOT DETECTED Final   Candida albicans NOT DETECTED NOT DETECTED Final   Candida glabrata NOT DETECTED NOT DETECTED Final   Candida krusei NOT DETECTED NOT DETECTED Final   Candida parapsilosis NOT DETECTED NOT DETECTED Final   Candida tropicalis NOT DETECTED NOT DETECTED Final    Comment: Performed at [facility], [address] [place], [place] [address]  Blood Culture (routine x 2)     Status: Abnormal   Collection Time: 12/25/18 10:15 AM   Specimen: BLOOD LEFT HAND  Result Value Ref Range Status   Specimen Description   Final    BLOOD LEFT HAND Performed at [facility] [address], [place], [place] [address]    Special Requests   Final    BOTTLES DRAWN AEROBIC AND ANAEROBIC Blood Culture results may not be optimal due to an  inadequate volume of blood received in culture bottles Performed at [facility] [address] [place], [place] [address]    Culture  Setup Time   Final    GRAM POSITIVE COCCI IN CLUSTERS IN BOTH AEROBIC AND ANAEROBIC BOTTLES CRITICAL VALUE NOTED.  VALUE IS CONSISTENT WITH PREVIOUSLY REPORTED AND CALLED VALUE.    Culture (A)  Final    STAPHYLOCOCCUS AUREUS SUSCEPTIBILITIES PERFORMED ON PREVIOUS CULTURE WITHIN THE LAST 5 DAYS. Performed at [facility], [place] [address] [place], [place] [address]    Report Status 12/28/2018 FINAL  Final  SARS Coronavirus 2 (CEPHEID - Performed in [place] hospital lab), Hosp Order     Status: None   Collection Time: 12/25/18  2:44 PM   Specimen: Nasopharyngeal Swab  Result Value Ref Range Status   SARS Coronavirus 2 NEGATIVE NEGATIVE Final    Comment: (NOTE) If result is NEGATIVE SARS-CoV-2 target nucleic acids are NOT DETECTED. The SARS-CoV-2 RNA is generally detectable in upper and lower  respiratory specimens during the acute phase of infection. The lowest  concentration of SARS-CoV-2 viral copies this assay can detect is 250  copies / mL. A negative result does not preclude SARS-CoV-2 infection  and should not be used as the sole basis for treatment or other  patient management decisions.  A negative result may occur with  improper specimen collection / handling, submission of specimen other  than nasopharyngeal swab, presence of viral mutation(s) within the  areas targeted by this assay, and inadequate number of viral copies  (<250 copies / mL). A negative result must be combined with clinical  observations, patient history, and epidemiological information. If result is  POSITIVE SARS-CoV-2 target nucleic acids are DETECTED. The SARS-CoV-2 RNA is generally detectable in upper and lower  respiratory specimens dur ing the acute phase of infection.  Positive  results are indicative of active infection with  SARS-CoV-2.  Clinical  correlation with patient history and other diagnostic information is  necessary to determine patient infection status.  Positive results do  not rule out bacterial infection or co-infection with other viruses. If result is PRESUMPTIVE POSTIVE SARS-CoV-2 nucleic acids MAY BE PRESENT.   A presumptive positive result was obtained on the submitted specimen  and confirmed on repeat testing.  While 2019 novel coronavirus  (SARS-CoV-2) nucleic acids may be present in the submitted sample  additional confirmatory testing may be necessary for epidemiological  and / or clinical management purposes  to differentiate between  SARS-CoV-2 and other Sarbecovirus currently known to infect humans.  If clinically indicated additional testing with an alternate test  methodology [phone]) is advised. The SARS-CoV-2 RNA is generally  detectable in upper and lower respiratory sp ecimens during the acute  phase of infection. The expected result is Negative. Fact Sheet for Patients:  StrictlyIdeas.no Fact Sheet for Healthcare Providers: BankingDealers.co.za This test is not yet approved or cleared by the [place] FDA and has been authorized for detection and/or diagnosis of SARS-CoV-2 by FDA under an Emergency Use Authorization (EUA).  This EUA will remain in effect (meaning this test can be used) for the duration of the COVID-19 declaration under Section 564(b)(1) of the Act, 21 U.S.C. section 360bbb-3(b)(1), unless the authorization is terminated or revoked sooner. Performed at [facility], [place] [address] [place], [place] [address]   MRSA PCR Screening     Status: None   Collection Time: 12/25/18  5:16 PM   Specimen: Nasal Mucosa; Nasopharyngeal  Result Value Ref Range Status   MRSA by PCR NEGATIVE NEGATIVE Final    Comment:        The GeneXpert MRSA Assay (FDA approved for NASAL specimens only), is one  component of a comprehensive MRSA colonization surveillance program. It is not intended to diagnose MRSA infection nor to guide or monitor treatment for MRSA infections. Performed at [facility], [place] [address], [place], [place] [address]   Culture, blood (routine x 2)     Status: None   Collection Time: 12/26/18  5:36 PM   Specimen: BLOOD  Result Value Ref Range Status   Specimen Description   Final    BLOOD RIGHT ANTECUBITAL Performed at [facility] [address], [place], [place] [address]    Special Requests   Final    BOTTLES DRAWN AEROBIC ONLY Blood Culture adequate volume Performed at [facility] [address], [place], [place] [address]    Culture   Final    NO GROWTH 5 DAYS Performed at [facility], [place] [address], [place], [place] [address]    Report Status 12/31/2018 FINAL  Final  Culture, blood (routine x 2)     Status: None   Collection Time: 12/26/18  5:36 PM   Specimen: BLOOD RIGHT HAND  Result Value Ref Range Status   Specimen Description   Final    BLOOD RIGHT HAND Performed at [facility] [address] [place], [place] [address]    Special Requests   Final    BOTTLES DRAWN AEROBIC ONLY Blood Culture adequate volume Performed at [facility] [address], [place], [place] [address]  Culture   Final    NO GROWTH 5 DAYS Performed at [facility], [place] [address] [place], [place] [address]    Report Status 12/31/2018 FINAL  Final     Radiology Studies: No results found.  Scheduled Meds: . anastrozole  1 mg Oral Daily  . calcium-vitamin D  1 tablet Oral TID  . carvedilol  6.25 mg Oral BID WC  . chlorhexidine  15 mL Mouth Rinse BID  . Chlorhexidine Gluconate Cloth  6 each Topical Q0600  . docusate sodium  100 mg Oral BID  . enoxaparin (LOVENOX) injection  40 mg Subcutaneous Q24H  . feeding supplement (ENSURE ENLIVE)  237 mL  Oral BID BM  . feeding supplement (PRO-STAT SUGAR FREE 64)  30 mL Oral TID BM  . loratadine  10 mg Oral Daily  . mouth rinse  15 mL Mouth Rinse q12n4p  . methylPREDNISolone (SOLU-MEDROL) injection  40 mg Intravenous Daily  . multivitamin with minerals  1 tablet Oral Daily  . nystatin   Topical TID  . sodium chloride flush  3 mL Intravenous Q12H   Continuous Infusions: . sodium chloride 250 mL (01/02/19 0962)  .  ceFAZolin (ANCEF) IV 2 g (01/02/19 8366)     LOS: 8 days   Flora Lipps, MD [facility] Pager On Amion  If 7PM-7AM, please contact night-coverage 01/02/2019, 10:33 AM

## 2019-01-03 LAB — CBC
HCT: 29.2 % — ABNORMAL LOW (ref 36.0–46.0)
Hemoglobin: 8.3 g/dL — ABNORMAL LOW (ref 12.0–15.0)
MCH: 31.4 pg (ref 26.0–34.0)
MCHC: 28.4 g/dL — ABNORMAL LOW (ref 30.0–36.0)
MCV: 110.6 fL — ABNORMAL HIGH (ref 80.0–100.0)
Platelets: 412 10*3/uL — ABNORMAL HIGH (ref 150–400)
RBC: 2.64 MIL/uL — ABNORMAL LOW (ref 3.87–5.11)
RDW: 21.7 % — ABNORMAL HIGH (ref 11.5–15.5)
WBC: 11.8 10*3/uL — ABNORMAL HIGH (ref 4.0–10.5)
nRBC: 2.7 % — ABNORMAL HIGH (ref 0.0–0.2)

## 2019-01-03 NOTE — Progress Notes (Signed)
Bipap not indicated at this time.  Will monitor for changes.

## 2019-01-03 NOTE — Progress Notes (Signed)
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 She presented to the hospital on 7/30 with worsening shortness of breath on the morning of admission. Patient was sitting on her bedside commode and was not able to stand up. She developed worsening shortness of breath. In the ED,patient was noted to be febrile with temperature 100, tachycardic. Labs revealed sodium 136, potassium 3.6, CO2 33, BUN 12 calcium 7.0, alkaline phosphatase 235, albumin 2.3, LDH 244, ferritin 2306,CRP 19, procalcitonin less than 0.10,d-dimer 7.9, fibrinogen more than 800, white blood cell 8.1, hemoglobin 9.6, platelets 214.BNP 89. Chest x-ray and CTA of the chest  was done and the patient was admitted to the hospital.  Subjective: Patient was seen and examined this morning.  Lying down in bed not in distress.  Patient states that she has not been out of bed since admission.  I do not see a physical therapy order.  Prior to presentation, patient states she was able to ambulate.  Assessment/Plan:  Principal Problem:   Acute on chronic respiratory failure with hypoxia (HCC) Active Problems:   Bone metastasis (HCC)   Pleural effusion, bilateral   Carcinoma of breast, estrogen receptor positive, stage 4, right (HCC)   Pressure injury of skin   Pneumonia   Respiratory failure, acute (HCC)   Acute on chronic hypoxic respiratory failure: Probably related to pneumonia, possible lymphangitic spread.. Patient presented with worsening hypoxia.  CT scan was negative for pulmonary  embolism but fibrosis and was unable to rule out superimposed infection.  Bilateral pleural effusion was small. SARS coronavirus test neg x 2.  Patient was continued on oxygen, nebulizer, IV steroids Solu-Medrol which has been decreased to 40 mg daily.  She was initially on BiPAP which has been weaned off at this time.  Currently on nasal cannula oxygen.  Clinically stable at this time  Stage IV metastatic breast cancer to boneand lung, pleural effusion; Reportedly, patient is undergoing radiation.  She does have pathological left humerus fracture and is supposed to follow-up with Ortho as outpatient.  She will have to continue palliative radiation as outpatient after discharge.  Pathological fracture of the left humerus.  Will need to follow-up as an outpatient with orthopedics.  Continue arm sling.  Anemia of chronic disease likely from malignancy.-Had received blood transfusion as outpatient.  Currently hemoglobin of 8.3 and is stable.  elevated d-dimer:  -CT angio of the chest was negative for PE. LE dopplers reviewed, neg for DVT.  elevated alkaline phosphatase:  -Secondary to metastatic bone disease.  Chronic diastolic Heart failure;  Patient received IV Lasix on admission.  BNP within normal limits.  On nasal cannula oxygen.  Currently compensated.  No weight gain noted  MSSA bacteremia -PICC line was placed on 12/30/2018.  Patient has been seen by ID who recommended total 4 weeks of antibiotic.  Repeat blood cultures have been negative.  2D echocardiogram was unremarkable.  Low-grade fever with leukocytosis 11.8 today.    L and R buttock pressure injury, present on admission, stage 2 Continue wound care.  Body mass index is 32.66 kg/m. Mobility: I ordered for physical therapy evaluation. Diet:  Regular diet DVT prophylaxis:  Lovenox subcu Code Status:   Code Status: Full Code  Family Communication:  Expected Discharge: in process of finding a SNF. Consultants:   Infectious disease  Procedures:    Antimicrobials: Anti-infectives (From admission, onward)   Start     Dose/Rate Route Frequency Ordered Stop   12/31/18 0000  ceFAZolin (ANCEF) IVPB     2 g Intravenous Every 8 hours 12/31/18 1554 01/23/19 2359   12/26/18 0600  ceFAZolin (ANCEF) IVPB 2g/100 mL premix     2 g 200 mL/hr over 30 Minutes Intravenous Every 8 hours 12/26/18 0445     12/25/18 2300  vancomycin (VANCOCIN) IVPB 1000 mg/200 mL premix  Status:  Discontinued     1,000 mg 200 mL/hr over 60 Minutes Intravenous Every 12 hours 12/25/18 1101 12/26/18 0444   12/25/18 2200  ceFEPIme (MAXIPIME) 2 g in sodium chloride 0.9 % 100 mL IVPB  Status:  Discontinued     2 g 200 mL/hr over 30 Minutes Intravenous Every 8 hours 12/25/18 1447 12/26/18 0444   12/25/18 1515  ceFEPIme (MAXIPIME) 1 g in sodium chloride 0.9 % 100 mL IVPB     1 g 200 mL/hr over 30 Minutes Intravenous  Once 12/25/18 1446 12/25/18 1728   12/25/18 1115  vancomycin (VANCOCIN) 2,000 mg in sodium chloride 0.9 % 500 mL IVPB     2,000 mg 250 mL/hr over 120 Minutes Intravenous  Once 12/25/18 1101 12/25/18 1405   12/25/18 1045  ceFEPIme (MAXIPIME) 1 g in sodium chloride 0.9 % 100 mL IVPB     1 g 200 mL/hr over 30 Minutes Intravenous  Once 12/25/18 1022 12/25/18 1207      Infusions:  . sodium chloride 250 mL (01/02/19 0613)  .  ceFAZolin (ANCEF) IV 2 g (01/03/19 1534)    Scheduled Meds: . anastrozole  1 mg Oral Daily  . calcium-vitamin D  1 tablet Oral TID  . carvedilol  6.25 mg Oral BID WC  . chlorhexidine  15 mL Mouth Rinse BID  . Chlorhexidine Gluconate Cloth  6 each Topical Q0600  . docusate sodium  100 mg Oral BID  . enoxaparin (LOVENOX) injection  40 mg Subcutaneous Q24H  . feeding supplement (ENSURE ENLIVE)  237 mL Oral BID BM  . feeding supplement (PRO-STAT SUGAR FREE 64)  30 mL Oral TID BM  . loratadine  10 mg Oral Daily  . mouth rinse  15 mL Mouth Rinse q12n4p  . methylPREDNISolone (SOLU-MEDROL) injection   40 mg Intravenous Daily  . multivitamin with minerals  1 tablet Oral Daily  . nystatin   Topical TID  . sodium chloride flush  3 mL Intravenous Q12H    PRN meds: sodium chloride, acetaminophen **OR** acetaminophen, albuterol, ALPRAZolam, antiseptic oral rinse, ondansetron **OR** ondansetron (ZOFRAN) IV, oxyCODONE, polyethylene glycol, sodium chloride flush, sodium chloride flush   Objective: Vitals:   01/03/19 0521 01/03/19 1332  BP: (!) 140/54 (!) 145/68  Pulse: 100 99  Resp: 16 16  Temp: 98 F (36.7 C) 98.8 F (37.1 C)  SpO2: 98% 97%    Intake/Output Summary (Last 24 hours) at 01/03/2019 1605 Last data filed at 01/03/2019 1221 Gross per 24 hour  Intake 480 ml  Output 1850 ml  Net -1370 ml   Filed Weights   12/29/18 0518 01/02/19 0500 01/03/19 0521  Weight: 88.9 kg 85.3 kg 83.6 kg   Weight change: -1.69 kg Body mass index is 32.66 kg/m.   Physical Exam: General exam: Appears calm  and comfortable.  Skin: No rashes, lesions or ulcers. HEENT: Atraumatic, normocephalic, supple neck, no obvious bleeding Lungs: Clear to auscultation bilaterally CVS: Regular rate and rhythm, no murmur GI/Abd soft, nontender, nondistended bowel sound present CNS: Alert, awake, oriented x3 Psychiatry: Mood appropriate Extremities: No pedal edema, no calf tenderness  Data Review: I have personally reviewed the laboratory data and studies available.  Recent Labs  Lab 12/31/18 0341 01/01/19 0351 01/02/19 0326 01/03/19 0358  WBC 13.0* 13.1* 11.2* 11.8*  HGB 8.6* 8.4* 8.0* 8.3*  HCT 29.2* 29.4* 28.1* 29.2*  MCV 109.0* 110.1* 110.6* 110.6*  PLT 359 368 372 412*   Recent Labs  Lab 12/31/18 0341 01/01/19 0351 01/02/19 0326  NA 140 138 139  K 4.4 3.9 4.1  CL 99 100 100  CO2 33* 35* 32  GLUCOSE 120* 94 107*  BUN 20 14 18   CREATININE 0.39* 0.39* 0.36*  CALCIUM 7.7* 7.9* 8.1*  MG  --  2.3  --   PHOS  --  2.4*  --     Terrilee Croak, MD  [facility] 01/03/2019

## 2019-01-04 NOTE — Progress Notes (Signed)
Nutrition Follow-up  DOCUMENTATION CODES:   Obesity unspecified  INTERVENTION:   -Continue Ensure Enlive po BID, each supplement provides 350 kcal and 20 grams of protein -D/c Prostat supplements -Continue Multivitamin with minerals daily  NUTRITION DIAGNOSIS:   Increased nutrient needs related to chronic illness, cancer and cancer related treatments as evidenced by estimated needs.  Ongoing.  GOAL:   Patient will meet greater than or equal to 90% of their needs  Progressing.  MONITOR:   PO intake, Supplement acceptance, Labs, Weight trends, I & O's  ASSESSMENT:   58 y.o. female with past medical history significant for stage 4 breast cancer with metastasis to bone, lung, and lymph node, chronic hypoxic respiratory failure on 2L of O2, CHF, malignant pleural effusion diagnosed in 10/2018. She presented to the ED complaining of worsening shortness of breath that started the morning of admission.  **RD working remotely**  Per PO documentation, PO intakes have ranged from "bites" to 75% completion. Pt does not like Prostat supplements, will d/c. Will continue Ensure supplements at this time.   Per weight records, weight has decreased by 5 lbs since 7/31.   Labs reviewed. Medications: OSCAL w/ D tablet TID, Multivitamin with minerals daily  Diet Order:   Diet Order            Diet regular Room service appropriate? Yes; Fluid consistency: Thin  Diet effective now              EDUCATION NEEDS:   Not appropriate for education at this time  Skin:  Skin Assessment: Skin Integrity Issues: Skin Integrity Issues:: DTI DTI: sacrum  Last BM:  8/8  Height:   Ht Readings from Last 1 Encounters:  12/27/18 5\' 3"  (1.6 m)    Weight:   Wt Readings from Last 1 Encounters:  01/04/19 85.2 kg    Ideal Body Weight:  52.3 kg  BMI:  Body mass index is 33.27 kg/m.  Estimated Nutritional Needs:   Kcal:  6834-1962 kcal  Protein:  130-140 grams  Fluid:  >/= 1.5 L/day    Clayton Bibles, MS, RD, LDN [place] [facility] Pager: [phone] After Hours Pager: [phone]

## 2019-01-04 NOTE — Evaluation (Signed)
Physical Therapy Evaluation Patient Details Name: Suzanne [name] MRN: [id number] DOB: 09/01/1960 Today's Date: 01/04/2019   History of Present Illness  Pt admitted with acute on chronic respiratory failure and hx of breast CA with mets to bone (currently with L UE in sling 2* pathologic fx) and lungs, chronic diastolic heart failure, anemia and pleural effusion.  Clinical Impression  Pt admitted as above and presents with functional mobility limitations 2* generalized weakness, balance deficits, very limited activity tolerance, and non-use fo L UE 2* pathologic fx.  Pt would benefit from follow up rehab at SNF level to maximize IND and safety    Follow Up Recommendations SNF    Equipment Recommendations  None recommended by PT    Recommendations for Other Services OT consult     Precautions / Restrictions Precautions Precautions: Fall;Other (comment) Precaution Comments: L UE in sling 2* pathologic fx Required Braces or Orthoses: Sling Restrictions Weight Bearing Restrictions: No LUE Weight Bearing: Non weight bearing      Mobility  Bed Mobility Overal bed mobility: Needs Assistance Bed Mobility: Supine to Sit     Supine to sit: Min assist;Mod assist     General bed mobility comments: Pt able to bring legs over EOB without assist but requiring assist to bring trunk to upright sitting  Transfers Overall transfer level: Needs assistance Equipment used: Rolling walker (2 wheeled) Transfers: Sit to/from Stand Sit to Stand: Mod assist         General transfer comment: cues for use of R UE to self assist; physical assist to bring wt up and fwd  Ambulation/Gait Ambulation/Gait assistance: Min assist;+2 physical assistance;+2 safety/equipment Gait Distance (Feet): 9 Feet Assistive device: Rolling walker (2 wheeled) Gait Pattern/deviations: Step-to pattern;Step-through pattern;Decreased step length - right;Decreased step length - left;Shuffle;Trunk flexed Gait velocity:  decr   General Gait Details: Pt with L hand on RW and R side of RW stabilized by PT.  Assist for RW management and stability;  distance ltd by SOB  Stairs            Wheelchair Mobility    Modified Rankin (Stroke Patients Only)       Balance Overall balance assessment: Needs assistance Sitting-balance support: No upper extremity supported;Feet supported Sitting balance-Leahy Scale: Good     Standing balance support: Single extremity supported Standing balance-Leahy Scale: Poor                               Pertinent Vitals/Pain Pain Assessment: No/denies pain    Home Living Family/patient expects to be discharged to:: Unsure                 Additional Comments: lives with dtr who has CP;  Pt hopeful for short term SNF placement    Prior Function Level of Independence: Independent with assistive device(s)         Comments: limited amb due to SOB     Hand Dominance   Dominant Hand: Right    Extremity/Trunk Assessment   Upper Extremity Assessment Upper Extremity Assessment: LUE deficits/detail;Generalized weakness LUE Deficits / Details: In sling 2* pathologic fx    Lower Extremity Assessment Lower Extremity Assessment: Generalized weakness       Communication   Communication: No difficulties  Cognition Arousal/Alertness: Awake/alert Behavior During Therapy: WFL for tasks assessed/performed Overall Cognitive Status: Within Functional Limits for tasks assessed  General Comments      Exercises     Assessment/Plan    PT Assessment Patient needs continued PT services  PT Problem List Decreased strength;Decreased range of motion;Decreased activity tolerance;Decreased balance;Decreased mobility;Decreased knowledge of use of DME;Obesity       PT Treatment Interventions DME instruction;Gait training;Stair training;Functional mobility training;Therapeutic  activities;Therapeutic exercise;Patient/family education;Balance training    PT Goals (Current goals can be found in the Care Plan section)  Acute Rehab PT Goals Patient Stated Goal: Short term rehab PT Goal Formulation: With patient Time For Goal Achievement: 01/18/19 Potential to Achieve Goals: [name]    Frequency Min 3X/week   Barriers to discharge Inaccessible home environment;Decreased caregiver support Stairs into home, lives with dtr who has CP and is limited in ability to assist    Co-evaluation               AM-PAC PT "6 Clicks" Mobility  Outcome Measure Help needed turning from your back to your side while in a flat bed without using bedrails?: A Lot Help needed moving from lying on your back to sitting on the side of a flat bed without using bedrails?: A Lot Help needed moving to and from a bed to a chair (including a wheelchair)?: A Lot Help needed standing up from a chair using your arms (e.g., wheelchair or bedside chair)?: A Lot Help needed to walk in hospital room?: A Lot Help needed climbing 3-5 steps with a railing? : A Lot 6 Click Score: 12    End of Session Equipment Utilized During Treatment: Gait belt;Oxygen Activity Tolerance: Patient tolerated treatment well;Patient limited by fatigue Patient left: in chair;with call bell/phone within reach;with chair alarm set Nurse Communication: Mobility status PT Visit Diagnosis: Difficulty in walking, not elsewhere classified (R26.2)    Time: 1235-1310 PT Time Calculation (min) (ACUTE ONLY): 35 min   Charges:   PT Evaluation $PT Eval Moderate Complexity: 1 Mod PT Treatments $Gait Training: 8-22 mins        [place] Pager [phone] Office [phone]   Harolyn Cocker 01/04/2019, 4:07 PM

## 2019-01-04 NOTE — Progress Notes (Signed)
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 She presented to the hospital on 7/30 with worsening shortness of breath on the morning of admission. Patient was sitting on her bedside commode and was not able to stand up. She developed worsening shortness of breath. In the ED,patient was noted to be febrile with temperature 100, tachycardic. Labs revealed sodium 136, potassium 3.6, CO2 33, BUN 12 calcium 7.0, alkaline phosphatase 235, albumin 2.3, LDH 244, ferritin 2306,CRP 19, procalcitonin less than 0.10,d-dimer 7.9, fibrinogen more than 800, white blood cell 8.1, hemoglobin 9.6, platelets 214.BNP 89. Chest x-ray and CTA of the chest  was done and the patient was admitted to the hospital.  Subjective: Patient was seen and examined this morning.  Lying down in bed not in distress.  No new symptoms.  Assessment/Plan:  Principal Problem:   Acute on chronic respiratory failure with hypoxia (HCC) Active Problems:   Bone metastasis (HCC)   Pleural effusion, bilateral   Carcinoma of breast, estrogen receptor positive, stage 4, right (HCC)   Pressure injury of skin   Pneumonia   Respiratory failure, acute (HCC)   Acute on chronic hypoxic respiratory failure: Probably related to pneumonia, possible lymphangitic spread.. Patient presented with worsening hypoxia.  CT scan was negative for pulmonary embolism but fibrosis and was unable to rule out superimposed infection.  Bilateral pleural effusion was small. SARS coronavirus test neg x 2.   Patient was continued on oxygen, nebulizer, IV steroids Solu-Medrol which has been decreased to 40 mg daily.  She was initially on BiPAP which has been weaned off at this time.  Currently on nasal cannula oxygen.  Clinically stable at this time  Stage IV metastatic breast cancer to boneand lung, pleural effusion; Reportedly, patient is undergoing radiation.  She does have pathological left humerus fracture and is supposed to follow-up with Ortho as outpatient.  She will have to continue palliative radiation as outpatient after discharge.  Pathological fracture of the left humerus.  Will need to follow-up as an outpatient with orthopedics.  Continue arm sling.  Anemia of chronic disease likely from malignancy - Had received blood transfusion as outpatient.  Currently hemoglobin of 8.3 and is stable.  Elevated d-dimer: CT angio of the chest was negative for PE. LE dopplers reviewed, neg for DVT.  Elevated alkaline phosphatase: secondary to metastatic bone disease.  Chronic diastolic Heart failure;  Patient received IV Lasix on admission.  BNP within normal limits.  On nasal cannula oxygen.  Currently compensated.  No weight gain noted  MSSA bacteremia -PICC line was placed on 12/30/2018.  Patient has been seen by ID who recommended total 4 weeks of antibiotic to continue till 01/23/2019.  Repeat blood cultures have been negative.  2D echocardiogram was unremarkable.  Low-grade fever with leukocytosis 11.8 on last blood work on 8/8.Marland Kitchen    L and R buttock pressure injury, present on admission, stage 2 Continue wound care.  Impaired mobility -Reports that she has not been out of bed since admission.  Reports she was functional prior to that.  PT evaluation ordered.  Body mass index is 33.27 kg/m. Mobility: I  ordered for physical therapy evaluation. Diet: Regular diet DVT prophylaxis:  Lovenox subcu Code Status:   Code Status: Full Code  Family Communication:  Expected Discharge: in  process of finding a SNF. Consultants:  Infectious disease  Procedures:    Antimicrobials: Anti-infectives (From admission, onward)   Start     Dose/Rate Route Frequency Ordered Stop   12/31/18 0000  ceFAZolin (ANCEF) IVPB     2 g Intravenous Every 8 hours 12/31/18 1554 01/23/19 2359   12/26/18 0600  ceFAZolin (ANCEF) IVPB 2g/100 mL premix     2 g 200 mL/hr over 30 Minutes Intravenous Every 8 hours 12/26/18 0445     12/25/18 2300  vancomycin (VANCOCIN) IVPB 1000 mg/200 mL premix  Status:  Discontinued     1,000 mg 200 mL/hr over 60 Minutes Intravenous Every 12 hours 12/25/18 1101 12/26/18 0444   12/25/18 2200  ceFEPIme (MAXIPIME) 2 g in sodium chloride 0.9 % 100 mL IVPB  Status:  Discontinued     2 g 200 mL/hr over 30 Minutes Intravenous Every 8 hours 12/25/18 1447 12/26/18 0444   12/25/18 1515  ceFEPIme (MAXIPIME) 1 g in sodium chloride 0.9 % 100 mL IVPB     1 g 200 mL/hr over 30 Minutes Intravenous  Once 12/25/18 1446 12/25/18 1728   12/25/18 1115  vancomycin (VANCOCIN) 2,000 mg in sodium chloride 0.9 % 500 mL IVPB     2,000 mg 250 mL/hr over 120 Minutes Intravenous  Once 12/25/18 1101 12/25/18 1405   12/25/18 1045  ceFEPIme (MAXIPIME) 1 g in sodium chloride 0.9 % 100 mL IVPB     1 g 200 mL/hr over 30 Minutes Intravenous  Once 12/25/18 1022 12/25/18 1207      Infusions:  . sodium chloride 250 mL (01/02/19 0613)  .  ceFAZolin (ANCEF) IV 2 g (01/04/19 0531)    Scheduled Meds: . anastrozole  1 mg Oral Daily  . calcium-vitamin D  1 tablet Oral TID  . carvedilol  6.25 mg Oral BID WC  . chlorhexidine  15 mL Mouth Rinse BID  . Chlorhexidine Gluconate Cloth  6 each Topical Q0600  . docusate sodium  100 mg Oral BID  . enoxaparin (LOVENOX) injection  40 mg Subcutaneous Q24H  . feeding supplement (ENSURE ENLIVE)  237 mL Oral BID BM  . loratadine  10 mg Oral Daily  . mouth rinse  15 mL Mouth Rinse q12n4p  . methylPREDNISolone (SOLU-MEDROL) injection  40 mg Intravenous Daily   . multivitamin with minerals  1 tablet Oral Daily  . nystatin   Topical TID  . sodium chloride flush  3 mL Intravenous Q12H    PRN meds: sodium chloride, acetaminophen **OR** acetaminophen, albuterol, ALPRAZolam, antiseptic oral rinse, ondansetron **OR** ondansetron (ZOFRAN) IV, oxyCODONE, polyethylene glycol, sodium chloride flush, sodium chloride flush   Objective: Vitals:   01/03/19 2229 01/04/19 0636  BP: (!) 144/68 (!) 147/66  Pulse: 96 97  Resp: 16 18  Temp: 98 F (36.7 C) 97.7 F (36.5 C)  SpO2: 98% 99%    Intake/Output Summary (Last 24 hours) at 01/04/2019 1233 Last data filed at 01/03/2019 1850 Gross per 24 hour  Intake -  Output 800 ml  Net -800 ml   Filed Weights   01/02/19 0500 01/03/19 0521 01/04/19 0636  Weight: 85.3 kg 83.6 kg 85.2 kg   Weight change: 1.542 kg Body mass index is 33.27 kg/m.   Physical Exam: General exam: Appears calm and comfortable.  Lying on bed.  Not in  distress Skin: No rashes, lesions or ulcers. HEENT: Atraumatic, normocephalic, supple neck, no obvious bleeding Lungs: Clear to auscultation bilaterally CVS: Regular rate and rhythm, no murmur GI/Abd soft, nontender, nondistended bowel sound present CNS: Alert, awake, oriented x3 Psychiatry: Mood appropriate Extremities: No pedal edema, no calf tenderness  Data Review: I have personally reviewed the laboratory data and studies available.  Recent Labs  Lab 12/31/18 0341 01/01/19 0351 01/02/19 0326 01/03/19 0358  WBC 13.0* 13.1* 11.2* 11.8*  HGB 8.6* 8.4* 8.0* 8.3*  HCT 29.2* 29.4* 28.1* 29.2*  MCV 109.0* 110.1* 110.6* 110.6*  PLT 359 368 372 412*   Recent Labs  Lab 12/31/18 0341 01/01/19 0351 01/02/19 0326  NA 140 138 139  K 4.4 3.9 4.1  CL 99 100 100  CO2 33* 35* 32  GLUCOSE 120* 94 107*  BUN 20 14 18   CREATININE 0.39* 0.39* 0.36*  CALCIUM 7.7* 7.9* 8.1*  MG  --  2.3  --   PHOS  --  2.4*  --     Terrilee Croak, MD  [facility] 01/04/2019

## 2019-01-04 NOTE — Progress Notes (Signed)
Pt. Does not require use of Bipap at this time.  Will continue to monitor.

## 2019-01-05 ENCOUNTER — Inpatient Hospital Stay (HOSPITAL_COMMUNITY): Payer: Medicaid Other

## 2019-01-05 NOTE — Progress Notes (Addendum)
Suzanne [name]   DOB:10-09-1960   FS#:[id number]   EBX#:[id number]  Subjective:  Suzanne [name] reports that her breathing has improved.  She would like to get out of bed and move around little bit more.  She has no abdominal pain, nausea, vomiting.  Objective: morbidly obese White woman examined in bed Vitals:   01/04/19 2054 01/05/19 0625  BP: (!) 157/74 (!) 143/60  Pulse: 94 96  Resp: 18 16  Temp: 98.6 F (37 C) 97.7 F (36.5 C)  SpO2: 99% 99%    Body mass index is 32.82 kg/m.  Intake/Output Summary (Last 24 hours) at 01/05/2019 1205 Last data filed at 01/05/2019 1025 Gross per 24 hour  Intake 1753 ml  Output 650 ml  Net 1103 ml   General: Awake and alert, no distress HEENT: No thrush or mucositis Lungs: Clear to auscultation bilaterally Cardiovascular: Regular rate and rhythm, no murmur, no lower extremity edema Abdomen: Positive bowel sounds, soft, nontender Neuro: Alert and oriented x3  CBG (last 3)  No results for input(s): GLUCAP in the last 72 hours.   Labs:  Lab Results  Component Value Date   WBC 11.8 (H) 01/03/2019   HGB 8.3 (L) 01/03/2019   HCT 29.2 (L) 01/03/2019   MCV 110.6 (H) 01/03/2019   PLT 412 (H) 01/03/2019   NEUTROABS 5.0 12/25/2018    '@LASTCHEMISTRY' @  Urine Studies No results for input(s): UHGB, CRYS in the last 72 hours.  Invalid input(s): UACOL, UAPR, USPG, UPH, UTP, UGL, UKET, UBIL, UNIT, UROB, ULEU, UEPI, UWBC, [place], [place], [place], [place], [place]  Basic Metabolic Panel: Recent Labs  Lab 12/31/18 0341 01/01/19 0351 01/02/19 0326  NA 140 138 139  K 4.4 3.9 4.1  CL 99 100 100  CO2 33* 35* 32  GLUCOSE 120* 94 107*  BUN '20 14 18  ' CREATININE 0.39* 0.39* 0.36*  CALCIUM 7.7* 7.9* 8.1*  MG  --  2.3  --   PHOS  --  2.4*  --    GFR Estimated Creatinine Clearance: 77.8 mL/min (A) (by C-G formula based on SCr of 0.36 mg/dL (L)). Liver Function Tests: Recent Labs  Lab 01/01/19 0351  AST 17  ALT 12  ALKPHOS 199*  BILITOT 0.4  PROT 5.4*   ALBUMIN 2.3*   No results for input(s): LIPASE, AMYLASE in the last 168 hours. No results for input(s): AMMONIA in the last 168 hours. Coagulation profile No results for input(s): INR, PROTIME in the last 168 hours.  CBC: Recent Labs  Lab 12/31/18 0341 01/01/19 0351 01/02/19 0326 01/03/19 0358  WBC 13.0* 13.1* 11.2* 11.8*  HGB 8.6* 8.4* 8.0* 8.3*  HCT 29.2* 29.4* 28.1* 29.2*  MCV 109.0* 110.1* 110.6* 110.6*  PLT 359 368 372 412*   Cardiac Enzymes: No results for input(s): CKTOTAL, CKMB, CKMBINDEX, TROPONINI in the last 168 hours. BNP: Invalid input(s): POCBNP CBG: No results for input(s): GLUCAP in the last 168 hours. D-Dimer No results for input(s): DDIMER in the last 72 hours. Hgb A1c No results for input(s): HGBA1C in the last 72 hours. Lipid Profile No results for input(s): CHOL, HDL, LDLCALC, TRIG, CHOLHDL, LDLDIRECT in the last 72 hours. Thyroid function studies No results for input(s): TSH, T4TOTAL, T3FREE, THYROIDAB in the last 72 hours.  Invalid input(s): FREET3 Anemia work up No results for input(s): VITAMINB12, FOLATE, FERRITIN, TIBC, IRON, RETICCTPCT in the last 72 hours. Microbiology Recent Results (from the past 240 hour(s))  Culture, blood (routine x 2)     Status: None   Collection  Time: 12/26/18  5:36 PM   Specimen: BLOOD  Result Value Ref Range Status   Specimen Description   Final    BLOOD RIGHT ANTECUBITAL Performed at [facility] [address] [place], [place] [address]    Special Requests   Final    BOTTLES DRAWN AEROBIC ONLY Blood Culture adequate volume Performed at [facility] [address] [place], [place] [address]    Culture   Final    NO GROWTH 5 DAYS Performed at [facility], [place] [address], [place], [place] [address]    Report Status 12/31/2018 FINAL  Final  Culture, blood (routine x 2)     Status: None   Collection Time: 12/26/18  5:36 PM   Specimen: BLOOD RIGHT HAND   Result Value Ref Range Status   Specimen Description   Final    BLOOD RIGHT HAND Performed at [facility] [address] [place], [place] [address]    Special Requests   Final    BOTTLES DRAWN AEROBIC ONLY Blood Culture adequate volume Performed at [facility] [address] [place], [place] [address]    Culture   Final    NO GROWTH 5 DAYS Performed at [facility], [place] [address] [place],  [address]    Report Status 12/31/2018 FINAL  Final      Studies:  No results found.  Assessment: 58 y.o. [place] woman presenting 10/26/2018 with right-sided inflammatory breast cancer, stage IV, involving lungs, lymph nodes and bones, as follows:             (a) chest CT scan 10/26/2018 shows bilateral pleural effusions, possible lymphangitic spread of tumor, diffuse bony metastatic disease, and significant axillary mediastinal and hilar adenopathy             (b) bone scan 10/27/2018 is a "near [place]" consistent with widespread bony metastatic disease             (c) head CT with and without contrast 10/30/2018 shows no intracranial metastatic disease, multiple calvarial lesions             (d) CA-27-29 on 10/27/2018 was 1810.4  (1) pleural fluid from right thoracentesis 10/28/2018 confirms malignant cells consistent with a breast primary, strongly estrogen and progesterone receptor positive, HER-2 not amplified, with an MIB-1 of 2%  (2) anastrozole started 10/29/2018             (a) palbociclib to start 11/13/2018             (b) denosumab/xgeva to start 11/13/2018  (3) associated problems:             (a) hypoxia secondary to effusions, deconditioning, morbid obesity             (b) pain from bone lesions             (c) right upper extremity lymphedema             (d) anemia, requiring transfusion             (e) poor venous access  (f) poor social situation  (4) genetics testing pending     Plan:  Serenitee's  breathing continues to improve.  She has been tolerating treatment well with anastrozole, palbociclib, and Xgeva.  She tells me that she is been off her palbociclib for a few weeks.  Recommend that she resume this at 100 mg daily.  Her daughter will  need to bring her home supply for her to resume this.  She would like to get out of bed and move around a little bit.  Physical therapy is involved in her care.  Recommend SNF/rehabilitation placement upon discharge.  I have reached out to our social worker at the cancer center to follow-up on the status of her Medicaid application.  We have requested an inpatient social work consult for this, but were told that this is handled by the financial counselor.  I am trying to determine who the person is to contact regarding this and will contact them once this information is known.  Will follow with you.  Mikey Bussing, NP 01/05/2019  12:05 PM Medical Oncology and Hematology [facility] [address] [place], [place] [address] Tel. [phone]    Fax. [phone]

## 2019-01-05 NOTE — Consult Note (Signed)
Reason for Consult:Pathologic left humerus fracture Referring Physician: Dr. Bennie Hind Suzanne [name] is an 58 y.o. female.  HPI: 58 yo female with metastatic breast cancer who was noted to have a pathologic fracture of her left humerus on an x-ray taken 12/20/18. She reports no shoulder pain at rest and only get pain when she torques the arm or tries to move the shoulder. She has no elbow forearm or hand pain. She denies LUE neurologic symptoms.She has been in the hospital with respiratory issues. She was supposed to have an orthopaedic office evaluation on 8/7 but was in the hospital. We were consulted today for evaluation and management.   Past Medical History:  Diagnosis Date  . Cancer ([place])   . Family history of breast cancer   . Family history of colon cancer   . Family history of melanoma   . Family history of prostate cancer   . Gallstones   . Glaucoma   . Mastitis    reports history of recurrent mastitis  . Metastatic breast cancer ([place])   . Obesity     Past Surgical History:  Procedure Laterality Date  . GLAUCOMA SURGERY  late 1990's  . IR THORACENTESIS ASP PLEURAL SPACE W/IMG GUIDE  10/28/2018  . IR THORACENTESIS ASP PLEURAL SPACE W/IMG GUIDE  10/29/2018    Family History  Problem Relation Age of Onset  . Breast cancer Sister        in her 25's-70s  . Heart disease Brother        CABG  . Heart disease Brother        CABG  . Skin cancer Brother        melanoma  . Colon cancer Cousin   . Heart attack Mother   . Heart attack Father   . Prostate cancer Brother     Social History:  reports that she has never smoked. She has never used smokeless tobacco. She reports that she does not drink alcohol or use drugs.  Allergies: No Known Allergies  Medications: I have reviewed the patient's current medications.  No results found for this or any previous visit (from the past 48 hour(s)).  Dg Chest Port 1 View  Result Date: 01/05/2019 CLINICAL DATA:  Follow-up pleural  effusion EXAM: PORTABLE CHEST 1 VIEW COMPARISON:  12/25/2018 FINDINGS: Cardiac shadow is stable. Elevation of the right hemidiaphragm is noted. Patchy changes are seen bilaterally but improved from the prior exam. A new right-sided PICC line is noted at the cavoatrial junction. Osseous metastatic disease is noted with changes in the proximal left humerus suspicious for fracture. IMPRESSION: Stable osseous metastatic disease. New right-sided PICC line with the tip at the cavoatrial junction. Patchy basilar infiltrates mildly improved from the prior study. Electronically Signed   By: Inez Catalina M.D.   On: 01/05/2019 16:27    ROS Blood pressure (!) 143/65, pulse (!) 103, temperature 98.7 F (37.1 C), temperature source Oral, resp. rate (!) 25, height 5\' 3"  (1.6 m), weight 84.1 kg, last menstrual period 05/28/2013, SpO2 100 %. Physical Exam Physical Examination: General appearance - alert, well appearing, and in no distress Mental status - alert, oriented to person, place, and time LUE- no deformity in upper arm; slightly tender proximal humerus; no pain on elbow flexion/extension or on wrist/hand motion; sensation intact; pulses intact;   X-ray- pathologic fracture proximal 1/3 humeral shaft essentially non-displaced on AP film of 12/20/18   Assessment/Plan: Left pathologic humerus fracture- Fortunately she is not experiencing much pain  with this fracture. Will order repeat x-rays to see if it has displaced or if any healing has started. Normally treatment would be IM nailing for stabilization but given lack of pain she may be able to avoid surgery if the x-rays show alignment is acceptable and/or healing has started. Will check x-rays and also discuss with one of our shoulder specialists  Gaynelle Arabian 01/05/2019, 4:54 PM

## 2019-01-05 NOTE — Progress Notes (Signed)
PROGRESS NOTE  Suzanne [name]  DOB: Jun 24, 1960  PCP: Patient, No Pcp Per RXV:[id number]  DOA: 12/25/2018  LOS: 11 days   Brief narrative: Suzanne [name] is a 58 y.o. 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 The patient was diagnosed with metastatic breast cancer in early June 2020.  At the time of diagnosis, she was found to have widespread metastasis and pathologic fracture in her right clavicle.  She subsequently underwent neoadjuvant chemotherapy but has also had her treatment complicated by recurrent malignant right-sided pleural effusions, neutropenia and thrombocytopenia.  Today she developed worsening shortness of breath and altered mental status and hence brought to the ED on 7/30.  In the ED,patient was noted to be tachycardic and febrile with temperature 100. She was admitted for sepsis likely secondary to sacral decubitus ulcer.  Chest x-ray and CT Angie of chest showed small bilateral pleural effusion, bandlike opacities in both lungs due to scarring or fibrosis and diffuse osseous metastatic disease, including pathologic fracture of the right clavicle.  Blood cultures obtained on admission subsequently grew MSSA.  Subjective: Patient was seen and examined this morning.  Propped up in bed.  Not in distress.  Pain controlled.  Took few steps with physical therapy yesterday.  Assessment/Plan:  Principal Problem:   Acute on chronic respiratory failure with hypoxia (HCC) Active Problems:   Bone metastasis (HCC)   Pleural effusion, bilateral   Carcinoma of breast, estrogen receptor positive, stage 4, right (HCC)   Pressure injury of skin   Pneumonia   Respiratory failure, acute (HCC)  MSSA bacteremia/sepsis  -Sepsis present on admission.  Likely source stage II-III sacral decubitus ulcer. -ID consultation was  obtained. -Sacral wound did not require any surgical intervention.  -TTE did not show any sign of endocarditis. -Repeat blood culture obtained on 7/31 did not show any growth. -Per ID recommendation, patient is currently on 2 weeks course of Ancef 2 g IV every 8 hours to last till 01/09/2019.  Stage IV metastatic breast cancer to boneand lung -Oncology follow-up appreciated.   -Patient to continue oral chemotherapy with anastrozole, palbociclib, denosumab/xgeva  Pathological left humerus fracture  -Noted on x-ray obtained on 7/25 as an outpatient for shoulder pain.  Seen by radiation oncology.  Apparently patient had an outpatient appointment with orthopedics on 8/6 which she missed because she was in the hospital.  I called for an inpatient orthopedic consult today to Dr. Wynelle Link.  -She will also follow-up with with radiation oncology as an outpatient. -Currently on an arm sling.  Acute on chronic hypoxic respiratory failure:  -Patient presented with worsening hypoxia. Imaging on admission did not show any acute findings. Has known history of right malignant pleural effusion.  Last thoracentesis was in 6/20.  Obtain a portable chest x-ray.  May benefit from repeat thoracentesis. -Per chart review, there was also concern of possible lymphangitic spread of tumor in the lungs. -Continue oxygen via nasal cannula, duo nebs.  Patient has remained on IV Solu-Medrol this admission without a stop today.  Anemia of chronic disease likely from malignancy - Had received blood transfusion as outpatient. Currently hemoglobin is stable between 8 and 9.  Elevated d-dimer: CT angio of the chest was negative for PE. LE dopplers reviewed, neg for DVT.  Elevated alkaline phosphatase: secondary tometastatic bone disease.  Chronic diastolic Heart failure;  Currently compensated.No  weight gain noted.  Continue Coreg.  Not on diuretics.  Impaired mobility - Patient lives with her adult daughter at  home who have cerebral palsy and is been unable to assist her mother with physical rehab or movement within the home.  She has very limited ambulation and has a stage II-III sacral decubital wound.   -PT eval obtained.  Diet: Regular diet DVT prophylaxis: Lovenox subcu Code Status:  Code Status: Full Code  Family Communication: Expected Discharge: in process of finding a SNF. Seeking SNF placement with [facility] letter of guarantee as pt has pending Medicaid but otherwise uninsured.   Consultants:  ID, oncology, orthopedics  Procedures:    Antimicrobials: Anti-infectives (From admission, onward)   Start     Dose/Rate Route Frequency Ordered Stop   12/31/18 0000  ceFAZolin (ANCEF) IVPB     2 g Intravenous Every 8 hours 12/31/18 1554 01/23/19 2359   12/26/18 0600  ceFAZolin (ANCEF) IVPB 2g/100 mL premix     2 g 200 mL/hr over 30 Minutes Intravenous Every 8 hours 12/26/18 0445 01/09/19 2359   12/25/18 2300  vancomycin (VANCOCIN) IVPB 1000 mg/200 mL premix  Status:  Discontinued     1,000 mg 200 mL/hr over 60 Minutes Intravenous Every 12 hours 12/25/18 1101 12/26/18 0444   12/25/18 2200  ceFEPIme (MAXIPIME) 2 g in sodium chloride 0.9 % 100 mL IVPB  Status:  Discontinued     2 g 200 mL/hr over 30 Minutes Intravenous Every 8 hours 12/25/18 1447 12/26/18 0444   12/25/18 1515  ceFEPIme (MAXIPIME) 1 g in sodium chloride 0.9 % 100 mL IVPB     1 g 200 mL/hr over 30 Minutes Intravenous  Once 12/25/18 1446 12/25/18 1728   12/25/18 1115  vancomycin (VANCOCIN) 2,000 mg in sodium chloride 0.9 % 500 mL IVPB     2,000 mg 250 mL/hr over 120 Minutes Intravenous  Once 12/25/18 1101 12/25/18 1405   12/25/18 1045  ceFEPIme (MAXIPIME) 1 g in sodium chloride 0.9 % 100 mL IVPB     1 g 200 mL/hr over 30 Minutes Intravenous  Once 12/25/18 1022 12/25/18 1207      Infusions:  . sodium chloride 250 mL (01/02/19 0613)  .  ceFAZolin (ANCEF) IV 2 g (01/05/19 1422)    Scheduled Meds: . anastrozole  1 mg  Oral Daily  . calcium-vitamin D  1 tablet Oral TID  . carvedilol  6.25 mg Oral BID WC  . chlorhexidine  15 mL Mouth Rinse BID  . Chlorhexidine Gluconate Cloth  6 each Topical Q0600  . docusate sodium  100 mg Oral BID  . enoxaparin (LOVENOX) injection  40 mg Subcutaneous Q24H  . feeding supplement (ENSURE ENLIVE)  237 mL Oral BID BM  . loratadine  10 mg Oral Daily  . mouth rinse  15 mL Mouth Rinse q12n4p  . multivitamin with minerals  1 tablet Oral Daily  . nystatin   Topical TID  . sodium chloride flush  3 mL Intravenous Q12H    PRN meds: sodium chloride, acetaminophen **OR** acetaminophen, albuterol, ALPRAZolam, antiseptic oral rinse, ondansetron **OR** ondansetron (ZOFRAN) IV, oxyCODONE, polyethylene glycol, sodium chloride flush, sodium chloride flush   Objective: Vitals:   01/05/19 0625 01/05/19 1341  BP: (!) 143/60 (!) 143/65  Pulse: 96 (!) 103  Resp: 16 (!) 25  Temp: 97.7 F (36.5 C) 98.7 F (37.1 C)  SpO2: 99% 100%    Intake/Output Summary (Last 24 hours) at 01/05/2019 1555 Last data filed at 01/05/2019  1025 Gross per 24 hour  Intake 1633 ml  Output 650 ml  Net 983 ml   Filed Weights   01/03/19 0521 01/04/19 0636 01/05/19 0625  Weight: 83.6 kg 85.2 kg 84.1 kg   Weight change: -1.134 kg Body mass index is 32.82 kg/m.   Physical Exam: General exam: Appears calm and comfortable.  Skin: No rashes, lesions or ulcers. HEENT: Atraumatic, normocephalic, supple neck, no obvious bleeding Lungs: Diminished air entry on the right likely due to effusion.  No crackles or wheezing on the left CVS: Regular rate and rhythm, no murmur GI/Abd soft, nontender, nondistended, bowel sound present CNS: Alert, awake, oriented x3 Psychiatry: Mood appropriate Extremities: No pedal edema, no calf tenderness  Data Review: I have personally reviewed the laboratory data and studies available.  Recent Labs  Lab 12/31/18 0341 01/01/19 0351 01/02/19 0326 01/03/19 0358  WBC 13.0*  13.1* 11.2* 11.8*  HGB 8.6* 8.4* 8.0* 8.3*  HCT 29.2* 29.4* 28.1* 29.2*  MCV 109.0* 110.1* 110.6* 110.6*  PLT 359 368 372 412*   Recent Labs  Lab 12/31/18 0341 01/01/19 0351 01/02/19 0326  NA 140 138 139  K 4.4 3.9 4.1  CL 99 100 100  CO2 33* 35* 32  GLUCOSE 120* 94 107*  BUN 20 14 18   CREATININE 0.39* 0.39* 0.36*  CALCIUM 7.7* 7.9* 8.1*  MG  --  2.3  --   PHOS  --  2.4*  --     Terrilee Croak, MD  [facility] 01/05/2019

## 2019-01-05 NOTE — Progress Notes (Signed)
Palbociclib ([place]) . Grade 3 or 4 hematologic toxicity (except lymphopenia unless associated with clinical events such as opportunistic infections) . Grade 3 ANC ([id number]/mm3) + fever > 38.5 C and/or infection . Grade ? 3 non-hematologic toxicity persisting despite medical treatment . Active infection    Pt on cefazolin for MSSA bacteremia x 4 weeks per ID - to end 01/22/19 D/w Dr Jana Hakim - defer [name] for now   Eudelia Bunch, Pharm.D [phone] 01/05/2019 4:55 PM

## 2019-01-05 NOTE — Progress Notes (Signed)
Addendum: Please see the associated from our nurse practitioner.  I evaluated the Suzanne [name] in person today and agree with the nurse practitioners evaluation.  In brief, we need to get Suzanne [name] back onto palbociclib and I will place a consult to pharmacy to get this accomplished.  I agree the patient will benefit from rehab referral if we can arrange for that.  I do not see a note from financial counseling regarding her Medicaid application but would appreciate input from them.  We will continue to follow with you.

## 2019-01-05 NOTE — Progress Notes (Signed)
Patient sat up in chair for about 2 hours. She's transferring well with front wheel walker and assist.

## 2019-01-06 DIAGNOSIS — M84511D Pathological fracture in neoplastic disease, right shoulder, subsequent encounter for fracture with routine healing: Secondary | ICD-10-CM

## 2019-01-06 NOTE — Progress Notes (Addendum)
Suzanne [name]   DOB:Feb 09, 1961   WP#:[id number]   VZS#:[id number]  Subjective:  Suzanne [name] reports that her breathing has improved.  She was able to get up to the recliner for several hours yesterday.  She has not yet walked in the hallway but would like to start to do so.  Objective: morbidly obese White woman examined in bed Vitals:   01/05/19 2058 01/06/19 0548  BP: (!) 144/69 (!) 146/57  Pulse: 86 87  Resp: 16 16  Temp: 98.2 F (36.8 C) 97.6 F (36.4 C)  SpO2: 100% 100%    Body mass index is 32.82 kg/m.  Intake/Output Summary (Last 24 hours) at 01/06/2019 1229 Last data filed at 01/06/2019 0700 Gross per 24 hour  Intake 577 ml  Output 650 ml  Net -73 ml   General: Awake and alert, no distress HEENT: No thrush or mucositis Lungs: Clear to auscultation bilaterally Cardiovascular: Regular rate and rhythm, no murmur, no lower extremity edema Abdomen: Positive bowel sounds, soft, nontender Neuro: Alert and oriented x3  CBG (last 3)  No results for input(s): GLUCAP in the last 72 hours.   Labs:  Lab Results  Component Value Date   WBC 11.8 (H) 01/03/2019   HGB 8.3 (L) 01/03/2019   HCT 29.2 (L) 01/03/2019   MCV 110.6 (H) 01/03/2019   PLT 412 (H) 01/03/2019   NEUTROABS 5.0 12/25/2018    '@LASTCHEMISTRY' @  Urine Studies No results for input(s): UHGB, CRYS in the last 72 hours.  Invalid input(s): UACOL, UAPR, USPG, UPH, UTP, UGL, UKET, UBIL, UNIT, UROB, ULEU, UEPI, UWBC, [place], [place], [place], [place], [place]  Basic Metabolic Panel: Recent Labs  Lab 12/31/18 0341 01/01/19 0351 01/02/19 0326  NA 140 138 139  K 4.4 3.9 4.1  CL 99 100 100  CO2 33* 35* 32  GLUCOSE 120* 94 107*  BUN '20 14 18  ' CREATININE 0.39* 0.39* 0.36*  CALCIUM 7.7* 7.9* 8.1*  MG  --  2.3  --   PHOS  --  2.4*  --    GFR Estimated Creatinine Clearance: 77.8 mL/min (A) (by C-G formula based on SCr of 0.36 mg/dL (L)). Liver Function Tests: Recent Labs  Lab 01/01/19 0351  AST 17  ALT 12  ALKPHOS 199*   BILITOT 0.4  PROT 5.4*  ALBUMIN 2.3*   No results for input(s): LIPASE, AMYLASE in the last 168 hours. No results for input(s): AMMONIA in the last 168 hours. Coagulation profile No results for input(s): INR, PROTIME in the last 168 hours.  CBC: Recent Labs  Lab 12/31/18 0341 01/01/19 0351 01/02/19 0326 01/03/19 0358  WBC 13.0* 13.1* 11.2* 11.8*  HGB 8.6* 8.4* 8.0* 8.3*  HCT 29.2* 29.4* 28.1* 29.2*  MCV 109.0* 110.1* 110.6* 110.6*  PLT 359 368 372 412*   Cardiac Enzymes: No results for input(s): CKTOTAL, CKMB, CKMBINDEX, TROPONINI in the last 168 hours. BNP: Invalid input(s): POCBNP CBG: No results for input(s): GLUCAP in the last 168 hours. D-Dimer No results for input(s): DDIMER in the last 72 hours. Hgb A1c No results for input(s): HGBA1C in the last 72 hours. Lipid Profile No results for input(s): CHOL, HDL, LDLCALC, TRIG, CHOLHDL, LDLDIRECT in the last 72 hours. Thyroid function studies No results for input(s): TSH, T4TOTAL, T3FREE, THYROIDAB in the last 72 hours.  Invalid input(s): FREET3 Anemia work up No results for input(s): VITAMINB12, FOLATE, FERRITIN, TIBC, IRON, RETICCTPCT in the last 72 hours. Microbiology No results found for this or any previous visit (from the past 240 hour(s)).  Studies:  Dg Chest Port 1 View  Result Date: 01/05/2019 CLINICAL DATA:  Follow-up pleural effusion EXAM: PORTABLE CHEST 1 VIEW COMPARISON:  12/25/2018 FINDINGS: Cardiac shadow is stable. Elevation of the right hemidiaphragm is noted. Patchy changes are seen bilaterally but improved from the prior exam. A new right-sided PICC line is noted at the cavoatrial junction. Osseous metastatic disease is noted with changes in the proximal left humerus suspicious for fracture. IMPRESSION: Stable osseous metastatic disease. New right-sided PICC line with the tip at the cavoatrial junction. Patchy basilar infiltrates mildly improved from the prior study. Electronically Signed   By:  Inez Catalina M.D.   On: 01/05/2019 16:27   Dg Humerus Left  Result Date: 01/05/2019 CLINICAL DATA:  Known left humeral fracture, continued pain EXAM: LEFT HUMERUS - 2+ VIEW COMPARISON:  Radiograph 12/20/2018 FINDINGS: Redemonstration of the diffusely mottled sclerotic and lytic appearance of the bones compatible with widespread osseous metastatic disease. Redemonstration of the pathologic fracture through the large lytic lesion in the proximal left humeral diaphysis. There is similar to slightly increased valgus angulation across the fracture. No new fracture is evident. Diffuse soft tissue swelling is present. Multiple pathologic left rib fractures are again seen within the included portion of the chest wall. Coarse interstitial changes present in the left lung are similar to prior with small left effusion. IMPRESSION: Redemonstration of widespread osseous metastatic disease Pathologic fracture of the proximal left humerus with similar to slightly increased valgus angulation Multiple pathologic left rib fractures. Diffuse interstitial changes and patchy consolidation in the lungs. Suspect trace left effusion. Electronically Signed   By: Lovena Le M.D.   On: 01/05/2019 19:06    Assessment: 58 y.o. [place] woman presenting 10/26/2018 with right-sided inflammatory breast cancer, stage IV, involving lungs, lymph nodes and bones, as follows:             (a) chest CT scan 10/26/2018 shows bilateral pleural effusions, possible lymphangitic spread of tumor, diffuse bony metastatic disease, and significant axillary mediastinal and hilar adenopathy             (b) bone scan 10/27/2018 is a "near [place]" consistent with widespread bony metastatic disease             (c) head CT with and without contrast 10/30/2018 shows no intracranial metastatic disease, multiple calvarial lesions             (d) CA-27-29 on 10/27/2018 was 1810.4  (1) pleural fluid from right thoracentesis 10/28/2018 confirms malignant  cells consistent with a breast primary, strongly estrogen and progesterone receptor positive, HER-2 not amplified, with an MIB-1 of 2%  (2) anastrozole started 10/29/2018             (a) palbociclib to start 11/13/2018             (b) denosumab/xgeva to start 11/13/2018  (3) associated problems:             (a) hypoxia secondary to effusions, deconditioning, morbid obesity             (b) pain from bone lesions             (c) right upper extremity lymphedema             (d) anemia, requiring transfusion             (e) poor venous access  (f) poor social situation  (4) genetics testing pending     Plan:  Pacey's breathing continues to improve.  She has been tolerating treatment well with anastrozole, palbociclib, and Xgeva.  Recommend restarting her palbocicilib at 100 mg daily.  Unfortunately, there is a medication interaction with cefazolin.  She will remain on cefazolin until 01/22/2019.  We will need to defer treatment with [place] for now.  She is becoming a little more active.  Recommend that she get up to the recliner for several hours a day.  She would also like to walk a little bit.  Will defer this to physical therapy.  Recommend SNF/rehabilitation placement upon discharge.  I have talked with the social workers at the cancer center who state that all information for Medicaid has been submitted and pending approval.  She can be placed in a skilled nursing facility with a letter guarantee with her Medicaid pending status.  Social work is following.  Will follow with you.  Mikey Bussing, NP 01/06/2019  12:29 PM Medical Oncology and Hematology [facility] [address] [place], [place] [address] Tel. [phone]    Fax. [phone]   ADDENDUM: I discussed w Suzanne [name] that I was not aware she was still being treated for the prior positive cultures (repeat cultures were negative). I agree w pharmacy to hold palbociclib until treatment is  completed.  Suzanne [name] continues to make very small but steady improvements and I am hopeful in a few months she will be back to her pre-diagnosis functional status.  I personally saw this patient and performed a substantive portion of this encounter with the listed APP documented above.   Chauncey Cruel, MD Medical Oncology and Hematology [facility] [address] [place], [place] [address] Tel. [phone]    Fax. [phone]

## 2019-01-06 NOTE — Progress Notes (Signed)
Ortho progress note  I have been asked by Dr Maureen Ralphs to consult on this patient regarding a displaced pathologic proximal humerus fracture.  From a review of available imagine studies this appears to be over a month old.  The patient is not sure when this happened but complains of pain with any use of the left arm. I see that she is referred for rehab and given that she will not be able functionally to do anything with this left arm, her rehab potential will be very limited. I discussed with her options for treatment including both non surgical care which would be an extended period of rest and immobilization and likely XRT to the proximal left humerus versus surgical stabilization.  She would like to consider surgery. I have reached out to Dr Jana Hakim and Dr [place] Pink to see if this is even an option or advisable for this patient given her complications from Stage IV metastatic breast CA.   Netta Cedars MD [facility] [phone] cell

## 2019-01-06 NOTE — Progress Notes (Signed)
PROGRESS NOTE  Suzanne [name] YYT:[id number] DOB: 12/21/1960 DOA: 12/25/2018 PCP: Patient, No Pcp Per  HPI/Recap of past 24 hours: Suzanne [name] is a 58 y.o. 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 (The patient was diagnosed with metastatic breast cancer in early June 2020. At the time of diagnosis, she was found to have widespread metastasis and pathologic fracture in her right clavicle. She subsequently underwent neoadjuvant chemotherapy but has also had her treatment complicated by recurrent malignant right-sided pleural effusions, neutropenia and thrombocytopenia.)  In the emergency room, patient noted to be septic, likely secondary to sacral decubitus ulcer.  Blood cultures on admission grew out MSSA.  Stabilized, ID recommendation was for 2 weeks of Ancef which finishes on 8/14.  Patient also found to have a pathologic left humeral fracture which does not look to require surgical intervention.  Currently patient is waiting for a skilled nursing bed.  Patient doing okay, with no complaints.  No pain in shoulder unless she does aggressive movement which she tries to limit.  Breathing at baseline.  Assessment/Plan: Principal Problem:   Acute on chronic respiratory failure with hypoxia (HCC) Active Problems:   Bone metastasis (HCC)   Pleural effusion, bilateral   Carcinoma of breast, estrogen receptor positive, stage 4, right (HCC)   Pressure injury of skin   Pneumonia   Respiratory failure, acute (HCC) MSSA bacteremia/sepsis  -Sepsis present on admission.  Likely source stage II-III sacral decubitus ulcer. -ID consultation was obtained. -Sacral wound did not require any surgical intervention.  -TTE did not show any sign of endocarditis. -Repeat blood culture obtained on 7/31 did not show any growth. -Per ID  recommendation, patient is currently on 2 weeks course of Ancef 2 g IV every 8 hours to last till 01/09/2019.  Stage IV metastatic breast cancer to boneand lung -Oncology follow-up appreciated.   -Patient to continue oral chemotherapy with anastrozole, palbociclib, denosumab/xgeva  Pathological left humerus fracture  -Noted on x-ray obtained on 7/25 as an outpatient for shoulder pain.  Seen by radiation oncology.  Apparently patient had an outpatient appointment with orthopedics on 8/6 which she missed because she was in the hospital.  Seen by Dr. Wynelle Link, orthopedics, on 8/10.  Normal treatment would be IM nailing for stabilization, but given lack of pain, they will plan to monitor closely.  X-rays note acceptable alignment.  Dr. Posey Pronto will follow with patient after discussion with shoulder specialist.  -She will also follow-up with with radiation oncology as an outpatient. -Currently on an arm sling.  Acute on chronic hypoxic respiratory failure:  -Patient presented with worsening hypoxia. Imaging on admission did not show any acute findings. Has known history of right malignant pleural effusion.  Last thoracentesis was in 6/20.  Obtain a portable chest x-ray.  May benefit from repeat thoracentesis. -Per chart review, there was also concern of possible lymphangitic spread of tumor in the lungs. -Continue current home dose of 2 L oxygen via nasal cannula, duo nebs.    Initially was on IV Solu-Medrol which has since been discontinued  Anemia of chronic disease likely from malignancy-Had received blood transfusion as outpatient. Currently hemoglobin is stable between 8 and 9.  Elevated d-dimer: CT angio of the chest was negative for PE. LE dopplers reviewed, neg for DVT.  Elevated alkaline phosphatase:secondary tometastatic bone disease.  Chronic diastolic Heart failure;  Currently compensated.No weight gain noted.  Continue Coreg.  Not on diuretics.  Impaired mobility -  Patient lives with her adult daughter at home who have cerebral palsy and is been unable to assist her mother with physical rehab or movement within the home.  She has very limited ambulation and has a stage II-III sacral decubital wound.   -PT eval obtained.  Code Status: Full code  Family Communication: Left message for husband  Disposition Plan: Skilled nursing facility when accepted   Consultants:  Orthopedics  Oncology  Infectious disease  Procedures:  None  Antimicrobials:  IV Ancef 7/31-8/14  DVT prophylaxis: Subcu Lovenox   Objective: Vitals:   01/05/19 2058 01/06/19 0548  BP: (!) 144/69 (!) 146/57  Pulse: 86 87  Resp: 16 16  Temp: 98.2 F (36.8 C) 97.6 F (36.4 C)  SpO2: 100% 100%    Intake/Output Summary (Last 24 hours) at 01/06/2019 1414 Last data filed at 01/06/2019 0700 Gross per 24 hour  Intake 577 ml  Output 650 ml  Net -73 ml   Filed Weights   01/03/19 0521 01/04/19 0636 01/05/19 0625  Weight: 83.6 kg 85.2 kg 84.1 kg   Body mass index is 32.82 kg/m.  Exam:   General: Alert and oriented x3  Cardiovascular: Regular rate and rhythm, S1-S2  Respiratory: Clear to auscultation bilaterally  Abdomen: Soft nontender, nondistended, positive bowel sounds  Musculoskeletal: No clubbing or cyanosis or edema, arm in sling  Psychiatry: Appropriate, no evidence of psychoses   Data Reviewed: CBC: Recent Labs  Lab 12/31/18 0341 01/01/19 0351 01/02/19 0326 01/03/19 0358  WBC 13.0* 13.1* 11.2* 11.8*  HGB 8.6* 8.4* 8.0* 8.3*  HCT 29.2* 29.4* 28.1* 29.2*  MCV 109.0* 110.1* 110.6* 110.6*  PLT 359 368 372 196*   Basic Metabolic Panel: Recent Labs  Lab 12/31/18 0341 01/01/19 0351 01/02/19 0326  NA 140 138 139  K 4.4 3.9 4.1  CL 99 100 100  CO2 33* 35* 32  GLUCOSE 120* 94 107*  BUN 20 14 18   CREATININE 0.39* 0.39* 0.36*  CALCIUM 7.7* 7.9* 8.1*  MG  --  2.3  --   PHOS  --  2.4*  --    GFR: Estimated Creatinine Clearance: 77.8  mL/min (A) (by C-G formula based on SCr of 0.36 mg/dL (L)). Liver Function Tests: Recent Labs  Lab 01/01/19 0351  AST 17  ALT 12  ALKPHOS 199*  BILITOT 0.4  PROT 5.4*  ALBUMIN 2.3*   No results for input(s): LIPASE, AMYLASE in the last 168 hours. No results for input(s): AMMONIA in the last 168 hours. Coagulation Profile: No results for input(s): INR, PROTIME in the last 168 hours. Cardiac Enzymes: No results for input(s): CKTOTAL, CKMB, CKMBINDEX, TROPONINI in the last 168 hours. BNP (last 3 results) No results for input(s): PROBNP in the last 8760 hours. HbA1C: No results for input(s): HGBA1C in the last 72 hours. CBG: No results for input(s): GLUCAP in the last 168 hours. Lipid Profile: No results for input(s): CHOL, HDL, LDLCALC, TRIG, CHOLHDL, LDLDIRECT in the last 72 hours. Thyroid Function Tests: No results for input(s): TSH, T4TOTAL, FREET4, T3FREE, THYROIDAB in the last 72 hours. Anemia Panel: No results for input(s): VITAMINB12, FOLATE, FERRITIN, TIBC, IRON, RETICCTPCT in the last 72 hours. Urine analysis:    Component Value Date/Time   COLORURINE YELLOW 10/27/2018 2346   APPEARANCEUR HAZY (A) 10/27/2018 2346   LABSPEC 1.038 (H) 10/27/2018 2346   PHURINE 5.0 10/27/2018 2346   GLUCOSEU NEGATIVE 10/27/2018  [place] 10/27/2018 2346   [place] 10/27/2018 2346   KETONESUR 5 (A) 10/27/2018 2346   PROTEINUR NEGATIVE 10/27/2018 2346   NITRITE NEGATIVE 10/27/2018 2346   LEUKOCYTESUR NEGATIVE 10/27/2018 2346   Sepsis Labs: @LABRCNTIP (procalcitonin:4,lacticidven:4)  )No results found for this or any previous visit (from the past 240 hour(s)).    Studies: Dg Chest Port 1 View  Result Date: 01/05/2019 CLINICAL DATA:  Follow-up pleural effusion EXAM: PORTABLE CHEST 1 VIEW COMPARISON:  12/25/2018 FINDINGS: Cardiac shadow is stable. Elevation of the right hemidiaphragm is noted. Patchy changes are seen bilaterally but improved from the prior  exam. A new right-sided PICC line is noted at the cavoatrial junction. Osseous metastatic disease is noted with changes in the proximal left humerus suspicious for fracture. IMPRESSION: Stable osseous metastatic disease. New right-sided PICC line with the tip at the cavoatrial junction. Patchy basilar infiltrates mildly improved from the prior study. Electronically Signed   By: Inez Catalina M.D.   On: 01/05/2019 16:27   Dg Humerus Left  Result Date: 01/05/2019 CLINICAL DATA:  Known left humeral fracture, continued pain EXAM: LEFT HUMERUS - 2+ VIEW COMPARISON:  Radiograph 12/20/2018 FINDINGS: Redemonstration of the diffusely mottled sclerotic and lytic appearance of the bones compatible with widespread osseous metastatic disease. Redemonstration of the pathologic fracture through the large lytic lesion in the proximal left humeral diaphysis. There is similar to slightly increased valgus angulation across the fracture. No new fracture is evident. Diffuse soft tissue swelling is present. Multiple pathologic left rib fractures are again seen within the included portion of the chest wall. Coarse interstitial changes present in the left lung are similar to prior with small left effusion. IMPRESSION: Redemonstration of widespread osseous metastatic disease Pathologic fracture of the proximal left humerus with similar to slightly increased valgus angulation Multiple pathologic left rib fractures. Diffuse interstitial changes and patchy consolidation in the lungs. Suspect trace left effusion. Electronically Signed   By: Lovena Le M.D.   On: 01/05/2019 19:06    Scheduled Meds: . anastrozole  1 mg Oral Daily  . calcium-vitamin D  1 tablet Oral TID  . carvedilol  6.25 mg Oral BID WC  . chlorhexidine  15 mL Mouth Rinse BID  . Chlorhexidine Gluconate Cloth  6 each Topical Q0600  . docusate sodium  100 mg Oral BID  . enoxaparin (LOVENOX) injection  40 mg Subcutaneous Q24H  . feeding supplement (ENSURE ENLIVE)   237 mL Oral BID BM  . loratadine  10 mg Oral Daily  . mouth rinse  15 mL Mouth Rinse q12n4p  . multivitamin with minerals  1 tablet Oral Daily  . nystatin   Topical TID  . sodium chloride flush  3 mL Intravenous Q12H    Continuous Infusions: . sodium chloride 250 mL (01/02/19 6834)  .  ceFAZolin (ANCEF) IV 2 g (01/06/19 1401)     LOS: 12 days     Annita Brod, MD [facility]  To reach me or the doctor on call, go to: [email or web address] Password [facility]  01/06/2019, 2:14 PM

## 2019-01-07 LAB — CBC
HCT: 28.1 % — ABNORMAL LOW (ref 36.0–46.0)
Hemoglobin: 8.2 g/dL — ABNORMAL LOW (ref 12.0–15.0)
MCH: 32.7 pg (ref 26.0–34.0)
MCHC: 29.2 g/dL — ABNORMAL LOW (ref 30.0–36.0)
MCV: 112 fL — ABNORMAL HIGH (ref 80.0–100.0)
Platelets: 376 10*3/uL (ref 150–400)
RBC: 2.51 MIL/uL — ABNORMAL LOW (ref 3.87–5.11)
RDW: 23.5 % — ABNORMAL HIGH (ref 11.5–15.5)
WBC: 9.3 10*3/uL (ref 4.0–10.5)
nRBC: 1 % — ABNORMAL HIGH (ref 0.0–0.2)

## 2019-01-07 LAB — COMPREHENSIVE METABOLIC PANEL
ALT: 11 U/L (ref 0–44)
AST: 18 U/L (ref 15–41)
Albumin: 2.6 g/dL — ABNORMAL LOW (ref 3.5–5.0)
Alkaline Phosphatase: 227 U/L — ABNORMAL HIGH (ref 38–126)
Anion gap: 8 (ref 5–15)
BUN: 16 mg/dL (ref 6–20)
CO2: 31 mmol/L (ref 22–32)
Calcium: 8 mg/dL — ABNORMAL LOW (ref 8.9–10.3)
Chloride: 101 mmol/L (ref 98–111)
Creatinine, Ser: 0.59 mg/dL (ref 0.44–1.00)
GFR calc Af Amer: >60 mL/min (ref >60)
GFR calc non Af Amer: >60 mL/min (ref >60)
Glucose, Bld: 143 mg/dL — ABNORMAL HIGH (ref 70–99)
Potassium: 3.2 mmol/L — ABNORMAL LOW (ref 3.5–5.1)
Sodium: 140 mmol/L (ref 135–145)
Total Bilirubin: 0.4 mg/dL (ref 0.3–1.2)
Total Protein: 5.9 g/dL — ABNORMAL LOW (ref 6.5–8.1)

## 2019-01-07 NOTE — Progress Notes (Signed)
PROGRESS NOTE  Suzanne [name] AXE:[id number] DOB: 29-Jun-1960 DOA: 12/25/2018 PCP: Patient, No Pcp Per  HPI/Recap of past 24 hours: Suzanne [name] is a 58 y.o. 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 (The patient was diagnosed with metastatic breast cancer in early June 2020. At the time of diagnosis, she was found to have widespread metastasis and pathologic fracture in her right clavicle. She subsequently underwent neoadjuvant chemotherapy but has also had her treatment complicated by recurrent malignant right-sided pleural effusions, neutropenia and thrombocytopenia.)  In the emergency room, patient noted to be septic, likely secondary to sacral decubitus ulcer.  Blood cultures on admission grew out MSSA.  Stabilized, ID recommendation was for 2 weeks of Ancef which finishes on 8/14.  Patient also found to have a pathologic left humeral fracture.  Examination by orthopedic surgery recommended surgery which would improve her arm function.  Patient doing okay, with no complaints.  She would like to have surgery if possible.  Breathing is stable, at baseline  Assessment/Plan: Principal Problem:   Acute on chronic respiratory failure with hypoxia (HCC) Active Problems:   Bone metastasis (HCC)   Pleural effusion, bilateral   Carcinoma of breast, estrogen receptor positive, stage 4, right (HCC)   Pressure injury of skin   Pneumonia   Respiratory failure, acute (HCC) MSSA bacteremia/sepsis  -Sepsis present on admission.  Likely source stage II-III sacral decubitus ulcer. -ID consultation was obtained. -Sacral wound did not require any surgical intervention.  -TTE did not show any sign of endocarditis. -Repeat blood culture obtained on 7/31 did not show any growth. -Per ID recommendation, patient is currently on 2 weeks course  of Ancef 2 g IV every 8 hours to last till 01/09/2019.  Stage IV metastatic breast cancer to boneand lung -Oncology follow-up appreciated.   -Patient to continue oral chemotherapy with anastrozole, palbociclib, denosumab/xgeva  Pathological left humerus fracture  -Noted on x-ray obtained on 7/25 as an outpatient for shoulder pain.  Seen by radiation oncology.  Apparently patient had an outpatient appointment with orthopedics on 8/6 which she missed because she was in the hospital.  Seen by Dr. Veverly Fells, orthopedics, who recommended surgery.  Patient will complete antibiotics on 8/14.  From a medical standpoint, she has relatively good quality of life and functionality.  Discussed with Dr. Jana Hakim of oncology and given that patient although stage IV, his primary bone which has average lifespan of 3 to 5 years, agrees that surgery should greatly help her quality of life.  Will discuss with orthopedic surgery who talked about possibility of surgery on Saturday -She will also follow-up with with radiation oncology as an outpatient. -Currently on an arm sling.  Acute on chronic hypoxic respiratory failure:  -Patient presented with worsening hypoxia. Imaging on admission did not show any acute findings. Has known history of right malignant pleural effusion.  Last thoracentesis was in 6/20.  Obtain a portable chest x-ray.  May benefit from repeat thoracentesis. -Per chart review, there was also concern of possible lymphangitic spread of tumor in the lungs. -Continue current home dose of 2 L oxygen via nasal cannula, duo nebs.    Initially was on IV Solu-Medrol which has since been discontinued  Anemia of chronic disease likely from malignancy-Had received blood transfusion as outpatient. Currently hemoglobin is stable between 8 and 9.  Elevated d-dimer: CT angio  of the chest was negative for PE. LE dopplers reviewed, neg for DVT.  Elevated alkaline phosphatase:secondary tometastatic bone  disease.  Chronic diastolic Heart failure;  Currently compensated.No weight gain noted.  Continue Coreg.  Not on diuretics.  Impaired mobility - Patient lives with her adult daughter at home who have cerebral palsy and is been unable to assist her mother with physical rehab or movement within the home.  She has very limited ambulation and has a stage II-III sacral decubital wound.   -PT eval obtained.  Code Status: Full code  Family Communication: Left message for husband  Disposition Plan: Surgery on Saturday.  Skilled nursing after   Consultants:  Orthopedics  Oncology  Infectious disease  Procedures:  None  Antimicrobials:  IV Ancef 7/31-8/14  DVT prophylaxis: Subcu Lovenox   Objective: Vitals:   01/06/19 1900 01/07/19 0445  BP: 128/66 (!) 146/71  Pulse: (!) 101 90  Resp: 18 14  Temp: 98.4 F (36.9 C) 97.9 F (36.6 C)  SpO2: 98% 100%    Intake/Output Summary (Last 24 hours) at 01/07/2019 1300 Last data filed at 01/07/2019 0844 Gross per 24 hour  Intake 2605.32 ml  Output 1600 ml  Net 1005.32 ml   Filed Weights   01/03/19 0521 01/04/19 0636 01/05/19 0625  Weight: 83.6 kg 85.2 kg 84.1 kg   Body mass index is 32.82 kg/m.  Exam:   General: Alert and oriented x3  HEENT: Normocephalic, atraumatic, mucous membranes are moist  Cardiovascular: Regular rate and rhythm, S1-S2  Respiratory: Clear to auscultation bilaterally  Abdomen: Soft nontender, nondistended, positive bowel sounds  Extremities: No clubbing or cyanosis or edema  Musculoskeletal: No clubbing or cyanosis or edema, left arm in sling  Psychiatry: Appropriate, no evidence of psychoses   Data Reviewed: CBC: Recent Labs  Lab 01/01/19 0351 01/02/19 0326 01/03/19 0358 01/07/19 1014  WBC 13.1* 11.2* 11.8* 9.3  HGB 8.4* 8.0* 8.3* 8.2*  HCT 29.4* 28.1* 29.2* 28.1*  MCV 110.1* 110.6* 110.6* 112.0*  PLT 368 372 412* 825   Basic Metabolic Panel: Recent Labs  Lab  01/01/19 0351 01/02/19 0326 01/07/19 1014  NA 138 139 140  K 3.9 4.1 3.2*  CL 100 100 101  CO2 35* 32 31  GLUCOSE 94 107* 143*  BUN 14 18 16   CREATININE 0.39* 0.36* 0.59  CALCIUM 7.9* 8.1* 8.0*  MG 2.3  --   --   PHOS 2.4*  --   --    GFR: Estimated Creatinine Clearance: 77.8 mL/min (by C-G formula based on SCr of 0.59 mg/dL). Liver Function Tests: Recent Labs  Lab 01/01/19 0351 01/07/19 1014  AST 17 18  ALT 12 11  ALKPHOS 199* 227*  BILITOT 0.4 0.4  PROT 5.4* 5.9*  ALBUMIN 2.3* 2.6*   No results for input(s): LIPASE, AMYLASE in the last 168 hours. No results for input(s): AMMONIA in the last 168 hours. Coagulation Profile: No results for input(s): INR, PROTIME in the last 168 hours. Cardiac Enzymes: No results for input(s): CKTOTAL, CKMB, CKMBINDEX, TROPONINI in the last 168 hours. BNP (last 3 results) No results for input(s): PROBNP in the last 8760 hours. HbA1C: No results for input(s): HGBA1C in the last 72 hours. CBG: No results for input(s): GLUCAP in the last 168 hours. Lipid Profile: No results for input(s): CHOL, HDL, LDLCALC, TRIG, CHOLHDL, LDLDIRECT in the last 72 hours. Thyroid Function Tests: No results for input(s): TSH, T4TOTAL, FREET4, T3FREE, THYROIDAB in the last 72 hours. Anemia Panel: No results for  input(s): VITAMINB12, FOLATE, FERRITIN, TIBC, IRON, RETICCTPCT in the last 72 hours. Urine analysis:    Component Value Date/Time   COLORURINE YELLOW 10/27/2018 2346   APPEARANCEUR HAZY (A) 10/27/2018 2346   LABSPEC 1.038 (H) 10/27/2018 2346   PHURINE 5.0 10/27/2018 2346   GLUCOSEU NEGATIVE 10/27/2018 2346   HGBUR NEGATIVE 10/27/2018 2346   BILIRUBINUR NEGATIVE 10/27/2018 2346   KETONESUR 5 (A) 10/27/2018 2346   PROTEINUR NEGATIVE 10/27/2018 2346   NITRITE NEGATIVE 10/27/2018 2346   LEUKOCYTESUR NEGATIVE 10/27/2018 2346   Sepsis Labs: @LABRCNTIP (procalcitonin:4,lacticidven:4)  )No results found for this or any previous visit (from the  past 240 hour(s)).    Studies: No results found.  Scheduled Meds: . anastrozole  1 mg Oral Daily  . calcium-vitamin D  1 tablet Oral TID  . carvedilol  6.25 mg Oral BID WC  . chlorhexidine  15 mL Mouth Rinse BID  . Chlorhexidine Gluconate Cloth  6 each Topical Q0600  . docusate sodium  100 mg Oral BID  . enoxaparin (LOVENOX) injection  40 mg Subcutaneous Q24H  . feeding supplement (ENSURE ENLIVE)  237 mL Oral BID BM  . loratadine  10 mg Oral Daily  . mouth rinse  15 mL Mouth Rinse q12n4p  . multivitamin with minerals  1 tablet Oral Daily  . nystatin   Topical TID  . sodium chloride flush  3 mL Intravenous Q12H    Continuous Infusions: . sodium chloride Stopped (01/05/19 2300)  .  ceFAZolin (ANCEF) IV 2 g (01/07/19 0525)     LOS: 13 days     Annita Brod, MD [facility]  To reach me or the doctor on call, go to: [email or web address] Password [email or web address]  01/07/2019, 1:00 PM

## 2019-01-08 ENCOUNTER — Encounter: Payer: Self-pay | Admitting: General Practice

## 2019-01-08 LAB — BASIC METABOLIC PANEL
Anion gap: 9 (ref 5–15)
BUN: 12 mg/dL (ref 6–20)
CO2: 29 mmol/L (ref 22–32)
Calcium: 7.7 mg/dL — ABNORMAL LOW (ref 8.9–10.3)
Chloride: 102 mmol/L (ref 98–111)
Creatinine, Ser: 0.49 mg/dL (ref 0.44–1.00)
GFR calc Af Amer: >60 mL/min (ref >60)
GFR calc non Af Amer: >60 mL/min (ref >60)
Glucose, Bld: 176 mg/dL — ABNORMAL HIGH (ref 70–99)
Potassium: 3.3 mmol/L — ABNORMAL LOW (ref 3.5–5.1)
Sodium: 140 mmol/L (ref 135–145)

## 2019-01-08 LAB — CREATININE, SERUM
Creatinine, Ser: 0.35 mg/dL — ABNORMAL LOW (ref 0.44–1.00)
GFR calc Af Amer: >60 mL/min (ref >60)
GFR calc non Af Amer: >60 mL/min (ref >60)

## 2019-01-08 MED ORDER — POTASSIUM CHLORIDE CRYS ER 20 MEQ PO TBCR
20.0000 meq | EXTENDED_RELEASE_TABLET | Freq: Once | ORAL | Status: AC
  Administered 2019-01-08: 20 meq via ORAL
  Filled 2019-01-08: qty 1

## 2019-01-08 MED ORDER — GUAIFENESIN-DM 100-10 MG/5ML PO SYRP
5.0000 mL | ORAL_SOLUTION | ORAL | Status: DC | PRN
  Administered 2019-01-08 – 2019-01-24 (×14): 5 mL via ORAL
  Filled 2019-01-08 (×14): qty 10

## 2019-01-08 MED ORDER — FUROSEMIDE 20 MG PO TABS
20.0000 mg | ORAL_TABLET | Freq: Every day | ORAL | Status: DC
  Administered 2019-01-08 – 2019-01-10 (×3): 20 mg via ORAL
  Filled 2019-01-08 (×3): qty 1

## 2019-01-08 MED ORDER — POTASSIUM CHLORIDE CRYS ER 20 MEQ PO TBCR
40.0000 meq | EXTENDED_RELEASE_TABLET | Freq: Once | ORAL | Status: AC
  Administered 2019-01-08: 40 meq via ORAL
  Filled 2019-01-08: qty 2

## 2019-01-08 NOTE — Progress Notes (Signed)
Suzanne [name]   DOB:[birth year]   TK#:[id number]   FTD#:[id number]  Subjective:  Suzanne [name] is feeling better overall this morning.  States that she had one coughing spell last evening and took a nebulizer.  Her symptoms resolved.  She is up to the recliner this morning.  Having some intermittent left shoulder pain.  Scheduled for ORIF tomorrow.  Has no other complaints this morning.  Objective: morbidly obese White woman examined in bed Vitals:   01/08/19 0441 01/08/19 1353  BP: 130/64 (!) 114/55  Pulse: 93 96  Resp: 18 17  Temp: 98.5 F (36.9 C) 99.2 F (37.3 C)  SpO2: 100% 100%    Body mass index is 33.04 kg/m.  Intake/Output Summary (Last 24 hours) at 01/08/2019 1406 Last data filed at 01/08/2019 1100 Gross per 24 hour  Intake 1588.15 ml  Output 2202 ml  Net -613.85 ml   General: Awake and alert, no distress HEENT: No thrush or mucositis Lungs: Clear to auscultation bilaterally Cardiovascular: Regular rate and rhythm, no murmur, no lower extremity edema Abdomen: Positive bowel sounds, soft, nontender Neuro: Alert and oriented x3  CBG (last 3)  No results for input(s): GLUCAP in the last 72 hours.   Labs:  Lab Results  Component Value Date   WBC 9.3 01/07/2019   HGB 8.2 (L) 01/07/2019   HCT 28.1 (L) 01/07/2019   MCV 112.0 (H) 01/07/2019   PLT 376 01/07/2019   NEUTROABS 5.0 12/25/2018    _0 @  Urine Studies No results for input(s): UHGB, CRYS in the last 72 hours.  Invalid input(s): UACOL, UAPR, USPG, UPH, UTP, UGL, UKET, UBIL, UNIT, UROB, ULEU, UEPI, UWBC, [place], [place]  Basic Metabolic Panel: Recent Labs  Lab 01/02/19 0326 01/07/19 1014 01/08/19 0345 01/08/19 0802  NA 139 140  --  140  K 4.1 3.2*  --  3.3*  CL 100 101  --  102  CO2 32 31  --  29  GLUCOSE 107* 143*  --  176*  BUN 18 16  --  12  CREATININE 0.36* 0.59 0.35* 0.49  CALCIUM 8.1* 8.0*  --  7.7*   GFR Estimated Creatinine Clearance: 78.1 mL/min (by C-G formula  based on SCr of 0.49 mg/dL). Liver Function Tests: Recent Labs  Lab 01/07/19 1014  AST 18  ALT 11  ALKPHOS 227*  BILITOT 0.4  PROT 5.9*  ALBUMIN 2.6*   No results for input(s): LIPASE, AMYLASE in the last 168 hours. No results for input(s): AMMONIA in the last 168 hours. Coagulation profile No results for input(s): INR, PROTIME in the last 168 hours.  CBC: Recent Labs  Lab 01/02/19 0326 01/03/19 0358 01/07/19 1014  WBC 11.2* 11.8* 9.3  HGB 8.0* 8.3* 8.2*  HCT 28.1* 29.2* 28.1*  MCV 110.6* 110.6* 112.0*  PLT 372 412* 376   Cardiac Enzymes: No results for input(s): CKTOTAL, CKMB, CKMBINDEX, TROPONINI in the last 168 hours. BNP: Invalid input(s): POCBNP CBG: No results for input(s): GLUCAP in the last 168 hours. D-Dimer No results for input(s): DDIMER in the last 72 hours. Hgb A1c No results for input(s): HGBA1C in the last 72 hours. Lipid Profile No results for input(s): CHOL, HDL, LDLCALC, TRIG, CHOLHDL, LDLDIRECT in the last 72 hours. Thyroid function studies No results for input(s): TSH, T4TOTAL, T3FREE, THYROIDAB in the last 72 hours.  Invalid input(s): FREET3 Anemia work up No results for input(s): VITAMINB12, FOLATE, FERRITIN, TIBC, IRON, RETICCTPCT in the last 72 hours. Microbiology No results found for this  or any previous visit (from the past 240 hour(s)).    Studies:  No results found.  Assessment: 58 y.o. [place] woman presenting 10/26/2018 with right-sided inflammatory breast cancer, stage IV, involving lungs, lymph nodes and bones, as follows:             (a) chest CT scan 10/26/2018 shows bilateral pleural effusions, possible lymphangitic spread of tumor, diffuse bony metastatic disease, and significant axillary mediastinal and hilar adenopathy             (b) bone scan 10/27/2018 is a "[place]" consistent with widespread bony metastatic disease             (c) head CT with and without contrast 10/30/2018 shows no intracranial metastatic  disease, multiple calvarial lesions             (d) CA-27-29 on 10/27/2018 was 1810.4  (1) pleural fluid from right thoracentesis 10/28/2018 confirms malignant cells consistent with a breast primary, strongly estrogen and progesterone receptor positive, HER-2 not amplified, with an MIB-1 of 2%  (2) anastrozole started 10/29/2018             (a) palbociclib to start 11/13/2018             (b) denosumab/xgeva to start 11/13/2018  (3) associated problems:             (a) hypoxia secondary to effusions, deconditioning, morbid obesity             (b) pain from bone lesions             (c) right upper extremity lymphedema             (d) anemia, requiring transfusion             (e) poor venous access  (f) poor social situation  (4) genetics testing pending     Plan:  Suzanne [name]'s continues to improve.  She has been tolerating treatment well with anastrozole, palbociclib, and Xgeva.  Unfortunately, we need to hold her [name] for now given that there is a drug interaction with her antibiotic.  We will plan to resume her Ibrance when she is off this antibiotic.  She is becoming a little more active.  I think that her surgery tomorrow morning will certainly help in terms of her getting more active and being able to participate with rehabilitation.  She is still awaiting placement at a skilled nursing facility.  Will follow with you.  Mikey Bussing, NP 01/08/2019  2:06 PM Medical Oncology and Hematology [facility] [address] [place], [place] [address] Tel. [phone]    Fax. [phone]

## 2019-01-08 NOTE — Progress Notes (Signed)
PROGRESS NOTE    Suzanne [name]  QVZ:[id number] DOB: [birth year] DOA: 12/25/2018 PCP: Patient, No Pcp Per   Brief Narrative: 58 year old with past medical history significant for stage IV breast cancer with known metastasis to bone, lung and lymph nodes and malignant pleural effusion admitted on 12/25/2018 with worsening shortness of breath and altered mental status.  Of note patient was diagnosed with metastatic breast cancer in early June 2020.  At the time of diagnosis she was found to have widespread metastatic disease and pathologic fracture in her right right clavicle.  She subsequently underwent neoadjuvant chemotherapy but has also had her treatment complicated by recurrent malignant right-sided pleural effusion, neutropenia and thrombocytopenia.  In the emergency room patient was noted to be septic, likely secondary to sacral decubital ulcer, blood cultures on admission grew MSSA.  ID was consulted and recommendation was for 2 weeks of Ancef, which finish on 8/14.  Patient was also found to have a pathologic left humeral fracture.  Examination by orthopedic surgery recommended surgery this admission.    Assessment & Plan:   Principal Problem:   Acute on chronic respiratory failure with hypoxia (HCC) Active Problems:   Bone metastasis (HCC)   Pleural effusion, bilateral   Carcinoma of breast, estrogen receptor positive, stage 4, right (HCC)   Pressure injury of skin   Pneumonia   Respiratory failure, acute (HCC)  1-MSSA bacteremia/sepsis; -Sepsis present on admission.  Likely source a stage II/III sacral decubitus ulcer. -ID consultation was obtained.. -Sacral wound did not require any surgical intervention.. -TTE did not show any signs of endocarditis.. -Repeated blood cultures on 7/30 first did not show any growth -Per ID recommendation, patient is currently on 2 weeks course of Ancef until 01/09/2019.   2-Stage IV metastatic breast cancer to bone and lung: -Oncology follow  up appreciated.. -Patient to continue oral chemotherapy with anastrozole, palbociclib and denosumab/xgeva.   3-Pathological left femur fractures: Noted on x-ray obtained on 7/25 as an outpatient for shoulder pain. Seen by radiation oncology.  Apparently patient had an appointment with orthopedic on 8/6 which she missed because she was in the hospital.  Seen by Dr. Veverly Fells with orthopedic who recommended surgery this admission. Patient will complete antibiotics on 8/14. Dr. [place] Pink discussed case with Dr. Jana Hakim and orthopedic doctor and decision was made to proceed with surgery this admission.  Acute on chronic hypoxic respiratory failure: Patient presented with worsening hypoxia.  Chest x-ray did not show any acute finding.  She has a known right malignant pleural effusion.  Last thoracentesis was 6/20.  Per chart review, there was some concern of possible lymphangitic spread of tumor in the lungs. Patient has been stable on 2 L of oxygen.  She was initially on IV Solu-Medrol which has been discontinued. Resume lower dose home lasix.   Anemia of chronic disease, likely related to malignancy. She has received blood transfusion as an outpatient. Currently hemoglobin has been stable around 8. Pete hemoglobin tomorrow.  Elevated d-dimer: CT angios chest was negative for PE, Dopplers of lower extremity negative for DVT.  Elevated alkaline phosphatase: Secondary to metastatic bone disease. Chronic diastolic heart failure: Appears compensated.  Continue with Coreg  Impaired mobility: Patient will likely require rehab admission  Stage II-3 decubitus ulcer sacrum present on admission Local care.  Pressure Injury 12/25/18 Sacrum Mid Deep Tissue Injury - Purple or maroon localized area of discolored intact skin or blood-filled blister due to damage of underlying soft tissue from pressure and/or shear. evolving into stage 2  in patchy areas when asse (Active)  12/25/18 1700  Location: Sacrum   Location Orientation: Mid  Staging: Deep Tissue Injury - Purple or maroon localized area of discolored intact skin or blood-filled blister due to damage of underlying soft tissue from pressure and/or shear.  Wound Description (Comments): evolving into stage 2 in patchy areas when assessed on 8/2  Present on Admission: Yes     Nutrition Problem: Increased nutrient needs Etiology: chronic illness, cancer and cancer related treatments    Signs/Symptoms: estimated needs    Interventions: Ensure Enlive (each supplement provides 350kcal and 20 grams of protein), MVI  Estimated body mass index is 33.04 kg/m as calculated from the following:   Height as of this encounter: 5\' 3"  (1.6 m).   Weight as of this encounter: 84.6 kg.   DVT prophylaxis: Lovenox Code Status: Full code Family Communication: Discussed with patient Disposition Plan: Plan for surgery tomorrow and a skilled nursing facility after Consultants:   Orthopedics  Oncology  Infectious disease  Procedures:   None  Antimicrobials:  IV Ancef from 7/31---8/14  Subjective: Patient denies chest pain, shortness of breath.  Is aware of plan for surgery tomorrow.  Objective: Vitals:   01/07/19 1457 01/07/19 2042 01/08/19 0441 01/08/19 1353  BP: 134/74 136/63 130/64 (!) 114/55  Pulse: 100 (!) 102 93 96  Resp: 16 18 18 17   Temp: 98.7 F (37.1 C) 98.8 F (37.1 C) 98.5 F (36.9 C) 99.2 F (37.3 C)  TempSrc: Oral Oral Axillary Oral  SpO2: 100% 100% 100% 100%  Weight:   84.6 kg   Height:        Intake/Output Summary (Last 24 hours) at 01/08/2019 1417 Last data filed at 01/08/2019 1100 Gross per 24 hour  Intake 1588.15 ml  Output 2202 ml  Net -613.85 ml   Filed Weights   01/04/19 0636 01/05/19 0625 01/08/19 0441  Weight: 85.2 kg 84.1 kg 84.6 kg    Examination:  General exam: Appears calm and comfortable  Respiratory system: Clear to auscultation. Respiratory effort normal. Cardiovascular system: S1 &  S2 heard, RRR. No JVD, murmurs, rubs, gallops or clicks. No pedal edema. Gastrointestinal system: Abdomen is nondistended, soft and nontender. No organomegaly or masses felt. Normal bowel sounds heard. Central nervous system: Alert and oriented. No focal neurological deficits. Extremities: Symmetric 5 x 5 power. Skin: No rashes, lesions or ulcers    Data Reviewed: I have personally reviewed following labs and imaging studies  CBC: Recent Labs  Lab 01/02/19 0326 01/03/19 0358 01/07/19 1014  WBC 11.2* 11.8* 9.3  HGB 8.0* 8.3* 8.2*  HCT 28.1* 29.2* 28.1*  MCV 110.6* 110.6* 112.0*  PLT 372 412* 106   Basic Metabolic Panel: Recent Labs  Lab 01/02/19 0326 01/07/19 1014 01/08/19 0345 01/08/19 0802  NA 139 140  --  140  K 4.1 3.2*  --  3.3*  CL 100 101  --  102  CO2 32 31  --  29  GLUCOSE 107* 143*  --  176*  BUN 18 16  --  12  CREATININE 0.36* 0.59 0.35* 0.49  CALCIUM 8.1* 8.0*  --  7.7*   GFR: Estimated Creatinine Clearance: 78.1 mL/min (by C-G formula based on SCr of 0.49 mg/dL). Liver Function Tests: Recent Labs  Lab 01/07/19 1014  AST 18  ALT 11  ALKPHOS 227*  BILITOT 0.4  PROT 5.9*  ALBUMIN 2.6*   No results for input(s): LIPASE, AMYLASE in the last 168 hours. No results for input(s): AMMONIA  in the last 168 hours. Coagulation Profile: No results for input(s): INR, PROTIME in the last 168 hours. Cardiac Enzymes: No results for input(s): CKTOTAL, CKMB, CKMBINDEX, TROPONINI in the last 168 hours. BNP (last 3 results) No results for input(s): PROBNP in the last 8760 hours. HbA1C: No results for input(s): HGBA1C in the last 72 hours. CBG: No results for input(s): GLUCAP in the last 168 hours. Lipid Profile: No results for input(s): CHOL, HDL, LDLCALC, TRIG, CHOLHDL, LDLDIRECT in the last 72 hours. Thyroid Function Tests: No results for input(s): TSH, T4TOTAL, FREET4, T3FREE, THYROIDAB in the last 72 hours. Anemia Panel: No results for input(s):  VITAMINB12, FOLATE, FERRITIN, TIBC, IRON, RETICCTPCT in the last 72 hours. Sepsis Labs: No results for input(s): PROCALCITON, LATICACIDVEN in the last 168 hours.  No results found for this or any previous visit (from the past 240 hour(s)).       Radiology Studies: No results found.      Scheduled Meds: . anastrozole  1 mg Oral Daily  . calcium-vitamin D  1 tablet Oral TID  . carvedilol  6.25 mg Oral BID WC  . chlorhexidine  15 mL Mouth Rinse BID  . Chlorhexidine Gluconate Cloth  6 each Topical Q0600  . docusate sodium  100 mg Oral BID  . enoxaparin (LOVENOX) injection  40 mg Subcutaneous Q24H  . feeding supplement (ENSURE ENLIVE)  237 mL Oral BID BM  . loratadine  10 mg Oral Daily  . mouth rinse  15 mL Mouth Rinse q12n4p  . multivitamin with minerals  1 tablet Oral Daily  . nystatin   Topical TID  . potassium chloride  40 mEq Oral Once  . sodium chloride flush  3 mL Intravenous Q12H   Continuous Infusions: . sodium chloride Stopped (01/05/19 2300)  .  ceFAZolin (ANCEF) IV 2 g (01/08/19 0535)     LOS: 14 days    Time spent: 35 minutes.     Elmarie Shiley, MD [facility] Pager [phone]  If 7PM-7AM, please contact night-coverage [email or web address] Password [facility] 01/08/2019, 2:17 PM

## 2019-01-08 NOTE — Progress Notes (Signed)
[facility] CSW Progress Notes  Call from daughter, [name].  Wants information about family waiting area during mother's surgery tomorrow.  Advised daughter to call and speak w floor RN to get best information.  Daughter has this contact information.  [name], LCSW Clinical Social Worker Phone:  [phone] Cell:  [phone]

## 2019-01-08 NOTE — Progress Notes (Signed)
Physical Therapy Treatment Patient Details Name: Suzanne [name] MRN: [id number] DOB: December 09, 1960 Today's Date: 01/08/2019    History of Present Illness Pt admitted with acute on chronic respiratory failure and hx of breast CA with mets to bone (currently with L UE in sling 2* pathologic fx) and lungs, chronic diastolic heart failure, anemia and pleural effusion.    PT Comments    Pt motivated to ambulate in room upon PT arrival. Pt ambulated 2x9 ft in room with use of quad cane, requiring seated rest break to recover dyspnea. Pt with sats 95% and greater on 2LO2. Pt continues to present with LE weakness and decreased activity tolerance, limiting mobility progression. PT to continue to work with pt acutely to address deficits.    Follow Up Recommendations  SNF     Equipment Recommendations  None recommended by PT    Recommendations for Other Services       Precautions / Restrictions Precautions Precautions: Fall Required Braces or Orthoses: Sling Restrictions Weight Bearing Restrictions: Yes LUE Weight Bearing: Non weight bearing    Mobility  Bed Mobility Overal bed mobility: Needs Assistance Bed Mobility: Supine to Sit     Supine to sit: Mod assist     General bed mobility comments: mod assist for trunk elevation with use of RUE HHA and bedrails, pt able to move LEs off EOB without PT intervention. Assist for scooting to EOB with use of bed pad.  Transfers Overall transfer level: Needs assistance Equipment used: Quad cane Transfers: Sit to/from Stand Sit to Stand: Min assist         General transfer comment: Min assist for power up, steadying. Verbal cuing for hand placement (on seated surface) when rising. Sit to stand x2, once from bed and once from recliner after first bout of ambulation.  Ambulation/Gait Ambulation/Gait assistance: Min assist Gait Distance (Feet): 18 Feet Assistive device: Quad cane Gait Pattern/deviations: Step-to pattern;Decreased step  length - right;Decreased step length - left;Trunk flexed;Wide base of support Gait velocity: decr   General Gait Details: Min assist for steadying, mod verbal cuing for placement of quad cane and sequencing with use of cane. 9+9 ft with 2 minute seated rest break due to pt fatigue and DOE 3/4. SpO2 95%-100% and HR up to 108 bpm.   Stairs             Wheelchair Mobility    Modified Rankin (Stroke Patients Only)       Balance Overall balance assessment: Needs assistance Sitting-balance support: No upper extremity supported;Feet supported Sitting balance-Leahy Scale: Good     Standing balance support: Single extremity supported Standing balance-Leahy Scale: Poor Standing balance comment: requires steadying assist                            Cognition Arousal/Alertness: Awake/alert Behavior During Therapy: WFL for tasks assessed/performed Overall Cognitive Status: Within Functional Limits for tasks assessed                                        Exercises      General Comments        Pertinent Vitals/Pain Pain Assessment: 0-10 Pain Score: 3  Pain Location: LUE Pain Descriptors / Indicators: Discomfort;Grimacing Pain Intervention(s): Limited activity within patient's tolerance;Monitored during session    Home Living  Prior Function            PT Goals (current goals can now be found in the care plan section) Acute Rehab PT Goals Patient Stated Goal: Short term rehab PT Goal Formulation: With patient Time For Goal Achievement: 01/18/19 Potential to Achieve Goals: Fair Progress towards PT goals: Progressing toward goals    Frequency    Min 3X/week      PT Plan Current plan remains appropriate    Co-evaluation              AM-PAC PT "6 Clicks" Mobility   Outcome Measure  Help needed turning from your back to your side while in a flat bed without using bedrails?: A Lot Help needed  moving from lying on your back to sitting on the side of a flat bed without using bedrails?: A Lot Help needed moving to and from a bed to a chair (including a wheelchair)?: A Little Help needed standing up from a chair using your arms (e.g., wheelchair or bedside chair)?: A Little Help needed to walk in hospital room?: A Little Help needed climbing 3-5 steps with a railing? : A Lot 6 Click Score: 15    End of Session Equipment Utilized During Treatment: Gait belt;Oxygen;Other (comment)(LUE sling) Activity Tolerance: Patient tolerated treatment well;Patient limited by fatigue Patient left: in chair;with call bell/phone within reach;with chair alarm set Nurse Communication: Mobility status PT Visit Diagnosis: Difficulty in walking, not elsewhere classified (R26.2);Unsteadiness on feet (R26.81)     Time: 9485-4627 PT Time Calculation (min) (ACUTE ONLY): 26 min  Charges:  $Gait Training: 23-37 mins                    Julien Girt, PT Acute Rehabilitation Services Pager [phone]  Office [phone]    [name] 01/08/2019, 6:28 PM

## 2019-01-08 NOTE — H&P (View-Only) (Signed)
Orthopedics Progress Note  Subjective: Patient still complaining of left shoulder pain  Objective:  Vitals:   01/07/19 2042 01/08/19 0441  BP: 136/63 130/64  Pulse: (!) 102 93  Resp: 18 18  Temp: 98.8 F (37.1 C) 98.5 F (36.9 C)  SpO2: 100% 100%    General: Awake and alert  Musculoskeletal: Left shoulder tender and unable to move doe to fracture Neurovascularly intact  Lab Results  Component Value Date   WBC 9.3 01/07/2019   HGB 8.2 (L) 01/07/2019   HCT 28.1 (L) 01/07/2019   MCV 112.0 (H) 01/07/2019   PLT 376 01/07/2019       Component Value Date/Time   NA 140 01/07/2019 1014   K 3.2 (L) 01/07/2019 1014   CL 101 01/07/2019 1014   CO2 31 01/07/2019 1014   GLUCOSE 143 (H) 01/07/2019 1014   BUN 16 01/07/2019 1014   CREATININE 0.35 (L) 01/08/2019 0345   CREATININE 0.63 12/22/2018 1219   CALCIUM 8.0 (L) 01/07/2019 1014   GFRNONAA >60 01/08/2019 0345   GFRNONAA >60 12/22/2018 1219   GFRAA >60 01/08/2019 0345   GFRAA >60 12/22/2018 1219    Lab Results  Component Value Date   INR 1.2 11/15/2018   INR 1.1 10/27/2018    Assessment/Plan: Left displaced pathologic proximal humerus fracture. After discussion with Dr. Maryland Pink and [name], it was felt that she would benefit from surgical stabilization of her fracture for pain control and assistance with mobilization. Plan is for ORIF tomorrow. Plan medical optimization today  Doran Heater. Veverly Fells, MD 01/08/2019 7:53 AM

## 2019-01-08 NOTE — Progress Notes (Signed)
Orthopedics Progress Note  Subjective: Patient still complaining of left shoulder pain  Objective:  Vitals:   01/07/19 2042 01/08/19 0441  BP: 136/63 130/64  Pulse: (!) 102 93  Resp: 18 18  Temp: 98.8 F (37.1 C) 98.5 F (36.9 C)  SpO2: 100% 100%    General: Awake and alert  Musculoskeletal: Left shoulder tender and unable to move doe to fracture Neurovascularly intact  Lab Results  Component Value Date   WBC 9.3 01/07/2019   HGB 8.2 (L) 01/07/2019   HCT 28.1 (L) 01/07/2019   MCV 112.0 (H) 01/07/2019   PLT 376 01/07/2019       Component Value Date/Time   NA 140 01/07/2019 1014   K 3.2 (L) 01/07/2019 1014   CL 101 01/07/2019 1014   CO2 31 01/07/2019 1014   GLUCOSE 143 (H) 01/07/2019 1014   BUN 16 01/07/2019 1014   CREATININE 0.35 (L) 01/08/2019 0345   CREATININE 0.63 12/22/2018 1219   CALCIUM 8.0 (L) 01/07/2019 1014   GFRNONAA >60 01/08/2019 0345   GFRNONAA >60 12/22/2018 1219   GFRAA >60 01/08/2019 0345   GFRAA >60 12/22/2018 1219    Lab Results  Component Value Date   INR 1.2 11/15/2018   INR 1.1 10/27/2018    Assessment/Plan: Left displaced pathologic proximal humerus fracture. After discussion with Dr. Maryland Pink and [name], it was felt that she would benefit from surgical stabilization of her fracture for pain control and assistance with mobilization. Plan is for ORIF tomorrow. Plan medical optimization today  Doran Heater. Veverly Fells, MD 01/08/2019 7:53 AM

## 2019-01-09 ENCOUNTER — Inpatient Hospital Stay (HOSPITAL_COMMUNITY): Payer: Medicaid Other | Admitting: Anesthesiology

## 2019-01-09 ENCOUNTER — Inpatient Hospital Stay (HOSPITAL_COMMUNITY): Payer: Medicaid Other

## 2019-01-09 ENCOUNTER — Encounter (HOSPITAL_COMMUNITY)
Admission: EM | Disposition: A | Discharge status: Home / Self Care | Payer: Self-pay | Source: Home / Self Care | Attending: Internal Medicine

## 2019-01-09 ENCOUNTER — Encounter (HOSPITAL_COMMUNITY): Payer: Self-pay | Admitting: Anesthesiology

## 2019-01-09 ENCOUNTER — Ambulatory Visit: Payer: Self-pay | Admitting: Orthopedic Surgery

## 2019-01-09 DIAGNOSIS — S42309A Unspecified fracture of shaft of humerus, unspecified arm, initial encounter for closed fracture: Secondary | ICD-10-CM | POA: Diagnosis present

## 2019-01-09 HISTORY — PX: OPEN REDUCTION INTERNAL FIXATION (ORIF) DISTAL RADIAL FRACTURE: SHX5989

## 2019-01-09 LAB — BLOOD GAS, ARTERIAL
Acid-Base Excess: 2.2 mmol/L — ABNORMAL HIGH (ref 0.0–2.0)
Bicarbonate: 30.3 mmol/L — ABNORMAL HIGH (ref 20.0–28.0)
Drawn by: 11249
O2 Content: 10 L/min
O2 Saturation: 90 %
Patient temperature: 98.7
pCO2 arterial: 68.8 mmHg (ref 32.0–48.0)
pH, Arterial: 7.267 — ABNORMAL LOW (ref 7.350–7.450)
pO2, Arterial: 69.8 mmHg — ABNORMAL LOW (ref 83.0–108.0)

## 2019-01-09 LAB — POCT I-STAT 4, (NA,K, GLUC, HGB,HCT)
Glucose, Bld: 101 mg/dL — ABNORMAL HIGH (ref 70–99)
HCT: 26 % — ABNORMAL LOW (ref 36.0–46.0)
Hemoglobin: 8.8 g/dL — ABNORMAL LOW (ref 12.0–15.0)
Potassium: 3.7 mmol/L (ref 3.5–5.1)
Sodium: 137 mmol/L (ref 135–145)

## 2019-01-09 LAB — BASIC METABOLIC PANEL
Anion gap: 5 (ref 5–15)
BUN: 9 mg/dL (ref 6–20)
CO2: 30 mmol/L (ref 22–32)
Calcium: 7.8 mg/dL — ABNORMAL LOW (ref 8.9–10.3)
Chloride: 104 mmol/L (ref 98–111)
Creatinine, Ser: 0.38 mg/dL — ABNORMAL LOW (ref 0.44–1.00)
GFR calc Af Amer: >60 mL/min (ref >60)
GFR calc non Af Amer: >60 mL/min (ref >60)
Glucose, Bld: 105 mg/dL — ABNORMAL HIGH (ref 70–99)
Potassium: 3.8 mmol/L (ref 3.5–5.1)
Sodium: 139 mmol/L (ref 135–145)

## 2019-01-09 LAB — CBC
HCT: 28.5 % — ABNORMAL LOW (ref 36.0–46.0)
Hemoglobin: 8.2 g/dL — ABNORMAL LOW (ref 12.0–15.0)
MCH: 32.5 pg (ref 26.0–34.0)
MCHC: 28.8 g/dL — ABNORMAL LOW (ref 30.0–36.0)
MCV: 113.1 fL — ABNORMAL HIGH (ref 80.0–100.0)
Platelets: 377 10*3/uL (ref 150–400)
RBC: 2.52 MIL/uL — ABNORMAL LOW (ref 3.87–5.11)
RDW: 24 % — ABNORMAL HIGH (ref 11.5–15.5)
WBC: 8.7 10*3/uL (ref 4.0–10.5)
nRBC: 0.7 % — ABNORMAL HIGH (ref 0.0–0.2)

## 2019-01-09 LAB — SURGICAL PCR SCREEN
MRSA, PCR: NEGATIVE
Staphylococcus aureus: POSITIVE — AB

## 2019-01-09 LAB — PREPARE RBC (CROSSMATCH)

## 2019-01-09 SURGERY — OPEN REDUCTION INTERNAL FIXATION (ORIF) DISTAL RADIUS FRACTURE
Anesthesia: General | Laterality: Left

## 2019-01-09 MED ORDER — ACETAMINOPHEN 325 MG PO TABS: 325.0000 mg | ORAL_TABLET | Freq: Four times a day (QID) | ORAL | Status: DC | PRN

## 2019-01-09 MED ORDER — FENTANYL CITRATE (PF) 100 MCG/2ML IJ SOLN
INTRAMUSCULAR | Status: AC
  Filled 2019-01-09: qty 2

## 2019-01-09 MED ORDER — ALBUTEROL SULFATE (2.5 MG/3ML) 0.083% IN NEBU
INHALATION_SOLUTION | RESPIRATORY_TRACT | Status: AC
  Administered 2019-01-09: 2.5 mg
  Filled 2019-01-09: qty 3

## 2019-01-09 MED ORDER — MORPHINE SULFATE (PF) 2 MG/ML IV SOLN: 0.5000 mg | INTRAVENOUS | Status: DC | PRN

## 2019-01-09 MED ORDER — FENTANYL CITRATE (PF) 100 MCG/2ML IJ SOLN: 25.0000 ug | INTRAMUSCULAR | Status: DC | PRN

## 2019-01-09 MED ORDER — FENTANYL CITRATE (PF) 100 MCG/2ML IJ SOLN: 50.0000 ug | INTRAMUSCULAR | Status: DC | PRN

## 2019-01-09 MED ORDER — SUGAMMADEX SODIUM 200 MG/2ML IV SOLN
INTRAVENOUS | Status: DC | PRN
  Administered 2019-01-09: 200 mg via INTRAVENOUS

## 2019-01-09 MED ORDER — FUROSEMIDE 10 MG/ML IJ SOLN
INTRAMUSCULAR | Status: AC
  Administered 2019-01-09: 22:00:00
  Filled 2019-01-09: qty 2

## 2019-01-09 MED ORDER — SODIUM CHLORIDE 3 % IN NEBU
INHALATION_SOLUTION | RESPIRATORY_TRACT | Status: AC
  Administered 2019-01-09: 21:00:00
  Filled 2019-01-09: qty 4

## 2019-01-09 MED ORDER — ONDANSETRON HCL 4 MG PO TABS: 4.0000 mg | ORAL_TABLET | Freq: Four times a day (QID) | ORAL | Status: DC | PRN

## 2019-01-09 MED ORDER — OXYCODONE HCL 5 MG/5ML PO SOLN: 5.0000 mg | Freq: Once | ORAL | Status: DC | PRN

## 2019-01-09 MED ORDER — SODIUM CHLORIDE 0.9 % IV SOLN
INTRAVENOUS | Status: DC
  Administered 2019-01-09: via INTRAVENOUS

## 2019-01-09 MED ORDER — BISACODYL 10 MG RE SUPP: 10.0000 mg | Freq: Every day | RECTAL | Status: DC | PRN

## 2019-01-09 MED ORDER — SODIUM CHLORIDE 0.9 % IV SOLN
INTRAVENOUS | Status: DC | PRN
  Administered 2019-01-09: 10 ug via INTRAVENOUS
  Administered 2019-01-09: 50 ug via INTRAVENOUS

## 2019-01-09 MED ORDER — FENTANYL CITRATE (PF) 100 MCG/2ML IJ SOLN
50.0000 ug | INTRAMUSCULAR | Status: AC
  Administered 2019-01-09: 50 ug via INTRAVENOUS

## 2019-01-09 MED ORDER — METOCLOPRAMIDE HCL 5 MG PO TABS: 5.0000 mg | ORAL_TABLET | Freq: Three times a day (TID) | ORAL | Status: DC | PRN

## 2019-01-09 MED ORDER — MIDAZOLAM HCL 2 MG/2ML IJ SOLN
1.0000 mg | INTRAMUSCULAR | Status: AC
  Administered 2019-01-09: 17:00:00 1 mg via INTRAVENOUS

## 2019-01-09 MED ORDER — MIDAZOLAM HCL 2 MG/2ML IJ SOLN
INTRAMUSCULAR | Status: AC
  Administered 2019-01-09: 1 mg via INTRAVENOUS
  Filled 2019-01-09: qty 2

## 2019-01-09 MED ORDER — FENTANYL CITRATE (PF) 100 MCG/2ML IJ SOLN
INTRAMUSCULAR | Status: DC | PRN
  Administered 2019-01-09 (×2): 25 ug via INTRAVENOUS
  Administered 2019-01-09: 50 ug via INTRAVENOUS

## 2019-01-09 MED ORDER — PROPOFOL 10 MG/ML IV BOLUS
INTRAVENOUS | Status: AC
  Filled 2019-01-09: qty 20

## 2019-01-09 MED ORDER — METOCLOPRAMIDE HCL 5 MG/ML IJ SOLN: 5.0000 mg | Freq: Three times a day (TID) | INTRAMUSCULAR | Status: DC | PRN

## 2019-01-09 MED ORDER — PHENOL 1.4 % MT LIQD
1.0000 | OROMUCOSAL | Status: DC | PRN
  Filled 2019-01-09: qty 177

## 2019-01-09 MED ORDER — FENTANYL CITRATE (PF) 100 MCG/2ML IJ SOLN
INTRAMUSCULAR | Status: AC
  Administered 2019-01-09
  Filled 2019-01-09: qty 2

## 2019-01-09 MED ORDER — MENTHOL 3 MG MT LOZG
1.0000 | LOZENGE | OROMUCOSAL | Status: DC | PRN
  Filled 2019-01-09: qty 9

## 2019-01-09 MED ORDER — MIDAZOLAM HCL 2 MG/2ML IJ SOLN
2.0000 mg | Freq: Once | INTRAMUSCULAR | Status: AC
  Administered 2019-01-09: 2 mg via INTRAVENOUS

## 2019-01-09 MED ORDER — ONDANSETRON HCL 4 MG/2ML IJ SOLN
INTRAMUSCULAR | Status: DC | PRN
  Administered 2019-01-09: 4 mg via INTRAVENOUS

## 2019-01-09 MED ORDER — LACTATED RINGERS IV SOLN
INTRAVENOUS | Status: DC | PRN
  Administered 2019-01-09: 19:00:00 via INTRAVENOUS

## 2019-01-09 MED ORDER — CEFAZOLIN SODIUM-DEXTROSE 2-4 GM/100ML-% IV SOLN: 2.0000 g | Freq: Four times a day (QID) | INTRAVENOUS | Status: DC

## 2019-01-09 MED ORDER — ROCURONIUM BROMIDE 100 MG/10ML IV SOLN
INTRAVENOUS | Status: DC | PRN
  Administered 2019-01-09: 50 mg via INTRAVENOUS
  Administered 2019-01-09: 10 mg via INTRAVENOUS

## 2019-01-09 MED ORDER — PROPOFOL 10 MG/ML IV BOLUS
INTRAVENOUS | Status: AC
  Filled 2019-01-09: qty 40

## 2019-01-09 MED ORDER — ONDANSETRON HCL 4 MG/2ML IJ SOLN: 4.0000 mg | Freq: Four times a day (QID) | INTRAMUSCULAR | Status: DC | PRN

## 2019-01-09 MED ORDER — BUPIVACAINE-EPINEPHRINE (PF) 0.25% -1:200000 IJ SOLN
INTRAMUSCULAR | Status: AC
  Filled 2019-01-09: qty 30

## 2019-01-09 MED ORDER — PROPOFOL 1000 MG/100ML IV EMUL
INTRAVENOUS | Status: AC
  Administered 2019-01-09: 50 ug/kg/min via INTRAVENOUS
  Filled 2019-01-09: qty 100

## 2019-01-09 MED ORDER — SODIUM CHLORIDE 0.9 % IV SOLN
INTRAVENOUS | Status: DC | PRN
  Administered 2019-01-09: 50 ug/min via INTRAVENOUS

## 2019-01-09 MED ORDER — FENTANYL CITRATE (PF) 100 MCG/2ML IJ SOLN
50.0000 ug | INTRAMUSCULAR | Status: DC | PRN
  Administered 2019-01-09 – 2019-01-10 (×2): 100 ug via INTRAVENOUS
  Filled 2019-01-09: qty 2

## 2019-01-09 MED ORDER — ONDANSETRON HCL 4 MG/2ML IJ SOLN
INTRAMUSCULAR | Status: AC
  Filled 2019-01-09: qty 2

## 2019-01-09 MED ORDER — PROPOFOL 10 MG/ML IV BOLUS
INTRAVENOUS | Status: DC | PRN
  Administered 2019-01-09: 140 mg via INTRAVENOUS
  Administered 2019-01-09: 40 mg via INTRAVENOUS

## 2019-01-09 MED ORDER — DOCUSATE SODIUM 100 MG PO CAPS: 100.0000 mg | ORAL_CAPSULE | Freq: Two times a day (BID) | ORAL | Status: DC

## 2019-01-09 MED ORDER — SODIUM CHLORIDE 0.9% IV SOLUTION: Freq: Once | INTRAVENOUS | Status: DC

## 2019-01-09 MED ORDER — LIDOCAINE 2% (20 MG/ML) 5 ML SYRINGE
INTRAMUSCULAR | Status: AC
  Filled 2019-01-09: qty 5

## 2019-01-09 MED ORDER — PROPOFOL 1000 MG/100ML IV EMUL
0.0000 ug/kg/min | INTRAVENOUS | Status: DC
  Administered 2019-01-09: 50 ug/kg/min via INTRAVENOUS
  Administered 2019-01-10: 30 ug/kg/min via INTRAVENOUS
  Administered 2019-01-10: 50 ug/kg/min via INTRAVENOUS
  Filled 2019-01-09 (×2): qty 100

## 2019-01-09 MED ORDER — MIDAZOLAM HCL 2 MG/2ML IJ SOLN: 2.0000 mg | INTRAMUSCULAR | Status: DC | PRN

## 2019-01-09 MED ORDER — MIDAZOLAM HCL 2 MG/2ML IJ SOLN
INTRAMUSCULAR | Status: AC
  Administered 2019-01-09: 21:00:00
  Filled 2019-01-09: qty 2

## 2019-01-09 MED ORDER — PANTOPRAZOLE SODIUM 40 MG IV SOLR
40.0000 mg | Freq: Every day | INTRAVENOUS | Status: DC
  Administered 2019-01-10 – 2019-01-16 (×7): 40 mg via INTRAVENOUS
  Filled 2019-01-09 (×7): qty 40

## 2019-01-09 MED ORDER — MIDAZOLAM HCL 2 MG/2ML IJ SOLN
INTRAMUSCULAR | Status: AC
  Filled 2019-01-09: qty 2

## 2019-01-09 MED ORDER — HYDROCODONE-ACETAMINOPHEN 5-325 MG PO TABS: 1.0000 | ORAL_TABLET | ORAL | Status: DC | PRN

## 2019-01-09 MED ORDER — POTASSIUM CHLORIDE CRYS ER 20 MEQ PO TBCR
20.0000 meq | EXTENDED_RELEASE_TABLET | Freq: Every day | ORAL | Status: DC
  Administered 2019-01-10 – 2019-01-11 (×2): 20 meq via ORAL
  Filled 2019-01-09 (×2): qty 1

## 2019-01-09 MED ORDER — DEXAMETHASONE SODIUM PHOSPHATE 10 MG/ML IJ SOLN
INTRAMUSCULAR | Status: AC
  Filled 2019-01-09: qty 1

## 2019-01-09 MED ORDER — POLYETHYLENE GLYCOL 3350 17 G PO PACK: 17.0000 g | PACK | Freq: Every day | ORAL | Status: DC | PRN

## 2019-01-09 MED ORDER — MIDAZOLAM HCL 5 MG/5ML IJ SOLN
INTRAMUSCULAR | Status: DC | PRN
  Administered 2019-01-09: 1 mg via INTRAVENOUS

## 2019-01-09 MED ORDER — ALBUMIN HUMAN 5 % IV SOLN
INTRAVENOUS | Status: DC | PRN
  Administered 2019-01-09: 20:00:00 via INTRAVENOUS

## 2019-01-09 MED ORDER — FENTANYL CITRATE (PF) 100 MCG/2ML IJ SOLN
INTRAMUSCULAR | Status: AC
  Administered 2019-01-09: 50 ug via INTRAVENOUS
  Filled 2019-01-09: qty 2

## 2019-01-09 MED ORDER — OXYCODONE HCL 5 MG PO TABS: 5.0000 mg | ORAL_TABLET | Freq: Once | ORAL | Status: DC | PRN

## 2019-01-09 MED ORDER — FUROSEMIDE 10 MG/ML IJ SOLN
20.0000 mg | Freq: Once | INTRAMUSCULAR | Status: AC
  Administered 2019-01-09: 20 mg via INTRAVENOUS

## 2019-01-09 MED ORDER — SODIUM CHLORIDE 0.9 % IV SOLN
INTRAVENOUS | Status: DC | PRN
  Administered 2019-01-09: 18:00:00 via INTRAVENOUS

## 2019-01-09 MED ORDER — BUPIVACAINE LIPOSOME 1.3 % IJ SUSP
INTRAMUSCULAR | Status: DC | PRN
  Administered 2019-01-09: 10 mL via PERINEURAL

## 2019-01-09 MED ORDER — BUPIVACAINE HCL (PF) 0.5 % IJ SOLN
INTRAMUSCULAR | Status: DC | PRN
  Administered 2019-01-09: 15 mL via PERINEURAL

## 2019-01-09 MED ORDER — BUPIVACAINE-EPINEPHRINE (PF) 0.25% -1:200000 IJ SOLN
INTRAMUSCULAR | Status: DC | PRN
  Administered 2019-01-09: 10 mL

## 2019-01-09 MED ORDER — ACETAMINOPHEN 325 MG PO TABS
650.0000 mg | ORAL_TABLET | Freq: Four times a day (QID) | ORAL | Status: DC | PRN
  Administered 2019-01-13 – 2019-01-22 (×3): 650 mg via ORAL
  Filled 2019-01-09 (×3): qty 2

## 2019-01-09 MED ORDER — ACETAMINOPHEN 650 MG RE SUPP: 650.0000 mg | Freq: Four times a day (QID) | RECTAL | Status: DC | PRN

## 2019-01-09 MED ORDER — LIDOCAINE HCL (CARDIAC) PF 100 MG/5ML IV SOSY
PREFILLED_SYRINGE | INTRAVENOUS | Status: DC | PRN
  Administered 2019-01-09: 40 mg via INTRAVENOUS

## 2019-01-09 MED ORDER — ROCURONIUM BROMIDE 10 MG/ML (PF) SYRINGE
PREFILLED_SYRINGE | INTRAVENOUS | Status: AC
  Filled 2019-01-09: qty 10

## 2019-01-09 MED ORDER — PHENYLEPHRINE HCL (PRESSORS) 10 MG/ML IV SOLN
INTRAVENOUS | Status: AC
  Filled 2019-01-09: qty 1

## 2019-01-09 MED ORDER — DEXAMETHASONE SODIUM PHOSPHATE 10 MG/ML IJ SOLN
INTRAMUSCULAR | Status: DC | PRN
  Administered 2019-01-09: 10 mg via INTRAVENOUS

## 2019-01-09 MED ORDER — LACTATED RINGERS IV SOLN
INTRAVENOUS | Status: DC
  Administered 2019-01-09 (×3): via INTRAVENOUS

## 2019-01-09 SURGICAL SUPPLY — 67 items
BAG ZIPLOCK 12X15 (MISCELLANEOUS) ×1 IMPLANT
BIT DRILL 3.2 (BIT) ×1
BIT DRILL 3.2XCALB NS DISP (BIT) IMPLANT
BIT DRILL CALIBRATED 2.7 (BIT) ×1 IMPLANT
BIT DRL 3.2XCALB NS DISP (BIT) ×1
COVER SURGICAL LIGHT HANDLE (MISCELLANEOUS) ×2 IMPLANT
COVER WAND RF STERILE (DRAPES) IMPLANT
DERMABOND ADVANCED (GAUZE/BANDAGES/DRESSINGS)
DERMABOND ADVANCED .7 DNX12 (GAUZE/BANDAGES/DRESSINGS) ×1 IMPLANT
DRAPE C-ARM 42X120 X-RAY (DRAPES) ×2 IMPLANT
DRAPE C-ARMOR (DRAPES) IMPLANT
DRAPE INCISE IOBAN 66X45 STRL (DRAPES) ×1 IMPLANT
DRAPE ORTHO SPLIT 77X108 STRL (DRAPES) ×2
DRAPE SURG ORHT 6 SPLT 77X108 (DRAPES) ×2 IMPLANT
DRAPE U-SHAPE 47X51 STRL (DRAPES) ×2 IMPLANT
DRSG AQUACEL AG ADV 3.5X 6 (GAUZE/BANDAGES/DRESSINGS) IMPLANT
DRSG AQUACEL AG ADV 3.5X10 (GAUZE/BANDAGES/DRESSINGS) IMPLANT
DURAPREP 26ML APPLICATOR (WOUND CARE) ×2 IMPLANT
ELECT REM PT RETURN 15FT ADLT (MISCELLANEOUS) ×2 IMPLANT
GLOVE BIO SURGEON STRL SZ7.5 (GLOVE) ×2 IMPLANT
GLOVE BIOGEL PI IND STRL 7.5 (GLOVE) ×1 IMPLANT
GLOVE BIOGEL PI IND STRL 8 (GLOVE) ×1 IMPLANT
GLOVE BIOGEL PI INDICATOR 7.5 (GLOVE) ×1
GLOVE BIOGEL PI INDICATOR 8 (GLOVE) ×6
GLOVE ORTHO TXT STRL SZ7.5 (GLOVE) ×2 IMPLANT
GLOVE SURG ORTHO 8.5 STRL (GLOVE) ×2 IMPLANT
GOWN STRL REUS W/TWL LRG LVL3 (GOWN DISPOSABLE) ×5 IMPLANT
K-WIRE 2X5 SS THRDED S3 (WIRE) ×2
KIT BASIN OR (CUSTOM PROCEDURE TRAY) ×2 IMPLANT
KIT TURNOVER KIT A (KITS) ×1 IMPLANT
KWIRE 2X5 SS THRDED S3 (WIRE) IMPLANT
MANIFOLD NEPTUNE II (INSTRUMENTS) ×2 IMPLANT
NDL TAPERED W/ NITINOL LOOP (MISCELLANEOUS) IMPLANT
NEEDLE TAPERED W/ NITINOL LOOP (MISCELLANEOUS) IMPLANT
NS IRRIG 1000ML POUR BTL (IV SOLUTION) ×2 IMPLANT
PACK SHOULDER (CUSTOM PROCEDURE TRAY) ×2 IMPLANT
PEG LOCKING 3.2MMX46 (Peg) ×1 IMPLANT
PEG LOCKING 3.2X34 (Screw) ×1 IMPLANT
PEG LOCKING 3.2X36 (Screw) ×1 IMPLANT
PEG LOCKING 3.2X40 (Peg) ×1 IMPLANT
PEG LOCKING 3.2X50 (Screw) ×1 IMPLANT
PLATE PROX HUM LO L 7H 133 (Plate) ×1 IMPLANT
PROTECTOR NERVE ULNAR (MISCELLANEOUS) ×1 IMPLANT
PUTTY DBM STAGRAFT PLUS 5CC (Putty) ×1 IMPLANT
RESTRAINT HEAD UNIVERSAL NS (MISCELLANEOUS) ×2 IMPLANT
SCREW LOCK CORT STAR 3.5X26 (Screw) ×3 IMPLANT
SCREW LOCK CORT STAR 3.5X28 (Screw) ×1 IMPLANT
SCREW LOCK CORT STAR 3.5X38 (Screw) ×1 IMPLANT
SCREW LOW PROF TIS 3.5X28MM (Screw) ×1 IMPLANT
SCREW LP NL T15 3.5X26 (Screw) ×1 IMPLANT
SCREW PEG LOCK 3.2X30MM (Screw) ×2 IMPLANT
SLING ARM FOAM STRAP LRG (SOFTGOODS) IMPLANT
SLING ARM FOAM STRAP MED (SOFTGOODS) IMPLANT
SLING ARM FOAM STRAP SML (SOFTGOODS) IMPLANT
STAPLER VISISTAT 35W (STAPLE) ×1 IMPLANT
SUCTION FRAZIER HANDLE 12FR (TUBING) ×1
SUCTION TUBE FRAZIER 12FR DISP (TUBING) ×1 IMPLANT
SUT FIBERWIRE #2 38 T-5 BLUE (SUTURE)
SUT MNCRL AB 3-0 PS2 18 (SUTURE) ×2 IMPLANT
SUT MON AB 2-0 CT1 36 (SUTURE) ×2 IMPLANT
SUT VIC AB 0 CT1 36 (SUTURE) ×1 IMPLANT
SUT VIC AB 1 CT1 36 (SUTURE) ×2 IMPLANT
SUT VIC AB 2-0 CT1 27 (SUTURE) ×2
SUT VIC AB 2-0 CT1 TAPERPNT 27 (SUTURE) IMPLANT
SUTURE FIBERWR #2 38 T-5 BLUE (SUTURE) IMPLANT
TOWEL OR 17X26 10 PK STRL BLUE (TOWEL DISPOSABLE) ×2 IMPLANT
TOWEL OR NON WOVEN STRL DISP B (DISPOSABLE) ×2 IMPLANT

## 2019-01-09 NOTE — Anesthesia Preprocedure Evaluation (Addendum)
Anesthesia Evaluation  Patient identified by MRN, date of birth, ID band Patient awake    Reviewed: Allergy & Precautions, H&P , NPO status , Patient's Chart, lab work & pertinent test results  Airway Mallampati: II   Neck ROM: full    Dental   Pulmonary shortness of breath,  Admitted with respiratory failure and pleural effusions.  Pt has pulmonary mets from stage IV breast CA.    + decreased breath sounds      Cardiovascular negative cardio ROS   Rhythm:regular Rate:Normal     Neuro/Psych    GI/Hepatic   Endo/Other  obese  Renal/GU      Musculoskeletal Pathologic left humerus fx   Abdominal   Peds  Hematology  (+) Blood dyscrasia, anemia ,   Anesthesia Other Findings   Reproductive/Obstetrics H/o metastatic breast CA                           Anesthesia Physical Anesthesia Plan  ASA: III  Anesthesia Plan: General   Post-op Pain Management:  Regional for Post-op pain   Induction:   PONV Risk Score and Plan: 3 and Ondansetron, Dexamethasone, Midazolam and Treatment may vary due to age or medical condition  Airway Management Planned: Oral ETT  Additional Equipment:   Intra-op Plan:   Post-operative Plan: Extubation in OR  Informed Consent: I have reviewed the patients History and Physical, chart, labs and discussed the procedure including the risks, benefits and alternatives for the proposed anesthesia with the patient or authorized representative who has indicated his/her understanding and acceptance.       Plan Discussed with: CRNA, Anesthesiologist and Surgeon  Anesthesia Plan Comments:         Anesthesia Quick Evaluation

## 2019-01-09 NOTE — Anesthesia Postprocedure Evaluation (Signed)
Anesthesia Post Note  Patient: Suzanne [name]  Procedure(s) Performed: OPEN REDUCTION INTERNAL FIXATION (ORIF) HUMERAL FRACTURE (Left )     Anesthesia Post Evaluation  Last Vitals:  Vitals:   01/09/19 2335 01/09/19 2345  BP:  120/75  Pulse:  81  Resp:  20  Temp: 36.6 C   SpO2:  100%    Last Pain:  Vitals:   01/09/19 2335  TempSrc: Axillary  PainSc:          Pt progressively got more tacypnic with respiratory rates 30-38, BP 150/90, HR 103 , anxious  Very SOB. O2 sats dropping to 88-90%. Despite  Ventolin nebulizer and versed 2mg  IV, no improvement. 21:35 Dr. Marcie Bal called regarding pt's deteriorating respiratory status.Lasix given, Chest Xray gotten,abg obtained. 22:30 Dr. Marcie Bal here for intubation.Preoxygenated with 100% Amboo,drugs given by Dr. Marcie Bal and intubated by Tawni Millers Crna,atraumatic oral intubation with taper ETT 7.,BBS equal  Scattered rhochi ,very diminished bilaterallly 1/2 way down lungs.To keep patient  Intubated overnight. O2 sat 97%, HR95, BP 130/82.  Transported to ICU on propofol GTT and given fentanyl 11mcgIV in ICU at 22:55 when pt coughing on ETT.VVS  O2 sat 100%, HR 95, BP 135/83  In ICU. Placed on vent FIO2 100%, peep5,  RR 20 TV 410. Bilateral breath sounds         [name] E

## 2019-01-09 NOTE — Consult Note (Signed)
NAME:  Suzanne [name], MRN:  [id number], DOB:  [birth year], LOS: 40 ADMISSION DATE:  12/25/2018, CONSULTATION DATE:  8/14 REFERRING MD:  Tyrell Antonio [facility]), CHIEF COMPLAINT:  Vent management    Brief History   58yo female with hx Stage IV breast cancer with known mets to bone and lung with malignant effusion undergoing current oral chemo (just dx June 2020).  She was initially admitted 7/30 with AMS and dyspnea.  She was admitted by hospitalist with concern for sepsis r/t sacral decub. Blood cultures this admit revealed MSSA bacteremia and she received 2 weeks of ancef compelted on 8/14.  She was found to have pathologic L humeral fx and underwent surgical repair ORIF 8/14 after abx course was complete. She was extubated post op but developed respiratory distress with tachypnea, hypoxia, respiratory acidosis, ?pulmonary edema and was reintubated in PACU.  PCCM called for vent management.   History of present illness   58yo female with hx Stage IV breast cancer with known mets to bone and lung with malignant effusion undergoing current oral chemo.  She was initially admitted 7/30 with AMS and dyspnea.  She was admitted by hospitalist with concern for sepsis r/t sacral decub. Blood cultures this admit revealed MSSA bacteremia and she received 2 weeks of ancef compelted on 8/14.  She was found to have pathologic L humeral fx and underwent surgical repair ORIF 8/14 after abx course was complete. She was extubated post op but developed respiratory distress with tachypnea, hypoxia, respiratory acidosis, ?pulmonary edema and was reintubated in PACU.  PCCM called for vent management.  Patient did have a CT scan of the chest done in late July that shows coarse bilateral fibrotic changes in the lower half of both lung fields bilaterally worse in the right lower and middle lobes.  Postprocedure chest x-ray is somewhat lordotic but does show reduced lung fields bilaterally.  She also has a history of bilateral chronic  effusions that are somewhat improved from previous.  Past Medical History   Past Medical History:  Diagnosis Date  . Cancer ([place])   . Family history of breast cancer   . Family history of colon cancer   . Family history of melanoma   . Family history of prostate cancer   . Gallstones   . Glaucoma   . Mastitis    reports history of recurrent mastitis  . Metastatic breast cancer ([place])   . Obesity      Significant Hospital Events   8/14 ORIF L humerus fx   Consults:  Ortho  ID  Heme/onc   Procedures: L humeral fx repair    Significant Diagnostic Tests:    Micro Data:  BC x 2 7/31>>> neg  BC x 2 7/30>>> MSSA   Antimicrobials:  Ancef 7/30>>>8/14  Interim history/subjective:  Reintubated in PACU after L humerus repair   Objective   Blood pressure (!) 118/98, pulse 90, temperature 97.6 F (36.4 C), resp. rate 20, height _0  (1.6 m), weight 81.2 kg, last menstrual period 05/28/2013, SpO2 100 %.    Vent Mode: PRVC FiO2 (%):  [100 %] 100 % Set Rate:  [20 bmp] 20 bmp Vt Set:  [410 mL] 410 mL PEEP:  [5 cmH20] 5 cmH20 Plateau Pressure:  [21 cmH20] 21 cmH20   Intake/Output Summary (Last 24 hours) at 01/09/2019 2318 Last data filed at 01/09/2019 2032 Gross per 24 hour  Intake 2864.86 ml  Output 2050 ml  Net 814.86 ml   Filed Weights   01/05/19  0625 01/08/19 0441 01/09/19 0603  Weight: 84.1 kg 84.6 kg 81.2 kg    Examination: General: Well-developed female in no distress intubated sedated HENT: Intubated within normal limits Lungs: Adequate bilateral breath sounds no crackles no rhonchi Cardiovascular: Regular rate and rhythm no murmurs gallops Abdomen: Benign Extremities: Within normal limits left upper extremity is postoperative Neuro: Sedated GU: N/A  Resolved Hospital Problem list     Assessment & Plan:  Acute on chronic hypoxic respiratory failure - post op ortho surgery c/b malignant pleural effusion +/- pulmonary edema with underlying chronic  diastolic heart failure.  Metastatic breast cancer  PLAN -  Vent support - 8cc/kg  F/u CXR  F/u ABG SBT in am  Consider further diuresis if BP will tolerate (had dose lasix in PACU)  Chemo per heme/onc  No current indication for thoracentesis   Stage IV metastatic breast ca  PLAN  Per heme/onc and primary  Continue chemo   MSSA bacteremia  PLAN  2 weeks ancef complete 8/14 F/u BC neg  TTE without signs of endocarditis   Pathologic L humerus fx - s/p surgical repair 8/14 PLAN -  Per ortho    Other issues per TRH to remain primary    Best practice:  Diet: NPO  Pain/Anxiety/Delirium protocol (if indicated): PAD protocol - fentanyl, propofol  VAP protocol (if indicated): ordered  DVT prophylaxis: lovenox GI prophylaxis: PPI Glucose control: n/a Mobility: BR Code Status: full Family Communication: updated per ortho 8/14 Disposition: ICU  Labs   CBC: Recent Labs  Lab 01/03/19 0358 01/07/19 1014 01/09/19 0445 01/09/19 1947  WBC 11.8* 9.3 8.7  --   HGB 8.3* 8.2* 8.2* 8.8*  HCT 29.2* 28.1* 28.5* 26.0*  MCV 110.6* 112.0* 113.1*  --   PLT 412* 376 377  --     Basic Metabolic Panel: Recent Labs  Lab 01/07/19 1014 01/08/19 0345 01/08/19 0802 01/09/19 0445 01/09/19 1947  NA 140  --  140 139 137  K 3.2*  --  3.3* 3.8 3.7  CL 101  --  102 104  --   CO2 31  --  29 30  --   GLUCOSE 143*  --  176* 105* 101*  BUN 16  --  12 9  --   CREATININE 0.59 0.35* 0.49 0.38*  --   CALCIUM 8.0*  --  7.7* 7.8*  --    GFR: Estimated Creatinine Clearance: 76.4 mL/min (A) (by C-G formula based on SCr of 0.38 mg/dL (L)). Recent Labs  Lab 01/03/19 0358 01/07/19 1014 01/09/19 0445  WBC 11.8* 9.3 8.7    Liver Function Tests: Recent Labs  Lab 01/07/19 1014  AST 18  ALT 11  ALKPHOS 227*  BILITOT 0.4  PROT 5.9*  ALBUMIN 2.6*   No results for input(s): LIPASE, AMYLASE in the last 168 hours. No results for input(s): AMMONIA in the last 168 hours.  ABG     Component Value Date/Time   PHART 7.267 (L) 01/09/2019 2210   PCO2ART 68.8 (HH) 01/09/2019 2210   PO2ART 69.8 (L) 01/09/2019 2210   HCO3 30.3 (H) 01/09/2019 2210   O2SAT 90.0 01/09/2019 2210     Coagulation Profile: No results for input(s): INR, PROTIME in the last 168 hours.  Cardiac Enzymes: No results for input(s): CKTOTAL, CKMB, CKMBINDEX, TROPONINI in the last 168 hours.  HbA1C: Hgb A1c MFr Bld  Date/Time Value Ref Range Status  10/27/2018 08:59 AM 5.3 4.8 - 5.6 % Final    Comment:    (  NOTE) Pre diabetes:          5.7%-6.4% Diabetes:              >6.4% Glycemic control for   <7.0% adults with diabetes     CBG: No results for input(s): GLUCAP in the last 168 hours.  Review of Systems:   Unable - pt sedated on vent. HPI as above obtained from records and staff   Past Medical History  She,  has a past medical history of Cancer [facility]), Family history of breast cancer, Family history of colon cancer, Family history of melanoma, Family history of prostate cancer, Gallstones, Glaucoma, Mastitis, Metastatic breast cancer ([place]), and Obesity.   Surgical History    Past Surgical History:  Procedure Laterality Date  . GLAUCOMA SURGERY  late 1990's  . IR THORACENTESIS ASP PLEURAL SPACE W/IMG GUIDE  10/28/2018  . IR THORACENTESIS ASP PLEURAL SPACE W/IMG GUIDE  10/29/2018     Social History   reports that she has never smoked. She has never used smokeless tobacco. She reports that she does not drink alcohol or use drugs.   Family History   Her family history includes Breast cancer in her sister; Colon cancer in her cousin; Heart attack in her father and mother; Heart disease in her brother and brother; Prostate cancer in her brother; Skin cancer in her brother.   Allergies No Known Allergies   Home Medications  Prior to Admission medications   Medication Sig Start Date End Date Taking? Authorizing Provider  ALPRAZolam (XANAX) 0.25 MG tablet Take 0.25-0.5 mg by mouth every 8  (eight) hours as needed for anxiety.  12/19/18  Yes [provider]  anastrozole (ARIMIDEX) 1 MG tablet Take 1 tablet (1 mg total) by mouth daily. 12/12/18  Yes [name], Virgie Dad, MD  antiseptic oral rinse (BIOTENE) LIQD 15 mLs by Mouth Rinse route as needed for dry mouth.   Yes [provider]  benzonatate (TESSALON) 200 MG capsule TAKE 1 CAPSULE BY MOUTH 3 TIMES DAILY IF NEEDED Patient taking differently: Take 200 mg by mouth 3 (three) times daily as needed for cough.  12/23/18  Yes [name], Virgie Dad, MD  calcium-vitamin D (OSCAL WITH D) 500-200 MG-UNIT tablet Take 1 tablet by mouth 3 (three) times daily. 11/30/18  Yes Rai, Ripudeep K, MD  carvedilol (COREG) 6.25 MG tablet Take 1 tablet (6.25 mg total) by mouth 2 (two) times daily with a meal. 12/01/18  Yes Rai, Ripudeep K, MD  furosemide (LASIX) 20 MG tablet Take 2 tablets (40 mg total) by mouth 2 (two) times daily. 12/09/18  Yes Causey, Charlestine Massed, NP  loratadine (CLARITIN) 10 MG tablet Take 1 tablet (10 mg total) by mouth daily. 11/29/18  Yes Rai, Ripudeep K, MD  nystatin (MYCOSTATIN/NYSTOP) powder Apply topically 3 (three) times daily. Apply under breast area 11/28/18  Yes Rai, Ripudeep K, MD  oxyCODONE (OXY IR/ROXICODONE) 5 MG immediate release tablet Take 0.5 tablets (2.5 mg total) by mouth every 4 (four) hours as needed for moderate pain or severe pain. 11/29/18  Yes Rai, Ripudeep K, MD  polyethylene glycol (MIRALAX / GLYCOLAX) 17 g packet Take 17 g by mouth as needed for constipation.   Yes [provider]  predniSONE (DELTASONE) 5 MG tablet Take 1 tablet (5 mg total) by mouth daily with breakfast. 12/09/18  Yes Causey, Charlestine Massed, NP  albuterol (VENTOLIN HFA) 108 (90 Base) MCG/ACT inhaler Inhale 2 puffs into the lungs every 6 (six) hours as needed for wheezing  or shortness of breath. 12/01/18   Rai, Ripudeep K, MD  ceFAZolin (ANCEF) IVPB Inject 2 g into the vein every 8 (eight) hours for 23 days. Indication:   Complicated MSSA bacteremia Last Day of Therapy:  01/22/19 Labs - Once weekly:  CBC/D, BMP, ESR, and CRP 12/31/18 01/23/19  Pokhrel, Laxman, MD  palbociclib (IBRANCE) 100 MG capsule Take 1 capsule (100 mg total) by mouth daily with breakfast. Take whole with food. Take for 21 days on, 7 days off, repeat every 28 days. 12/22/18   [name], Virgie Dad, MD     Critical care time: 35 minutes was spent in chart review bedside evaluation and discussing case with ICU caregivers.

## 2019-01-09 NOTE — Op Note (Signed)
NAME: Suzanne [name], Suzanne [name] MEDICAL RECORD OJ:[id number] ACCOUNT [id number] DATE OF BIRTH:1960-06-10 FACILITY: [facility] LOCATION: [facility] PHYSICIAN:STEVEN Orlena Sheldon, MD  OPERATIVE REPORT  DATE OF PROCEDURE:  01/09/2019  PREOPERATIVE DIAGNOSIS:  Left pathologic humerus fracture.  POSTOPERATIVE DIAGNOSIS:  Left pathologic humerus fracture.  PROCEDURE PERFORMED:  Left humerus fracture open reduction internal fixation using Biomet ALPS plate.  ATTENDING SURGEON:  Esmond Plants, MD  ASSISTANT:  Darol Destine, [place], who was scrubbed during the entire procedure and necessary for satisfactory completion of surgery.  ANESTHESIA:  General anesthesia was used plus interscalene block.  ESTIMATED BLOOD LOSS:  100 mL.  FLUID REPLACEMENT:  400 mL packed red cells, 250 mL albumin, and 2100 mL crystalloid.  INSTRUMENT COUNTS:  Correct.  COMPLICATIONS:  There were no complications.  ANTIBIOTICS:  Perioperative antibiotics were given.  INDICATIONS:  The patient is a 58 year old female with metastatic breast cancer who presents with a pathologic left humerus fracture that is probably 2 months old.  The patient has had ongoing pain despite conservative management with a sling and rest  and has a pseudoarthrosis at the fracture site.  There is no visible bridging callus.  We counseled the patient and the medical team regarding options for management.  It is felt that the patient's longevity and activity level will necessitate function  with that arm.  Currently, the arm is nonfunctional and painful.  After careful consultation with all parties, it was decided to move forward with surgical repair of the humerus fracture.  The patient was preoperatively optimized from a medical  standpoint and prepared for surgery.  Risks and benefits of surgical management were discussed in detail with the patient.  Informed consent was obtained.  DESCRIPTION OF PROCEDURE:  After an adequate level of anesthesia was  achieved, the patient was positioned in the modified beach chair position.  Left shoulder and arm correctly identified and sterilely prepped and draped in the usual manner.  Time-out  called verifying correct patient, correct site.  We utilized an extended deltopectoral incision starting at the coracoid process extending down to the anterior humerus.  Dissection down through the subcutaneous tissues using Bovie.  We identified the  cephalic vein, took that laterally with the deltoid pectoralis taken medially.  We identified the fractured humerus.  There was some basically scar tissue that was connecting the proximal and distal fragments, but there was still relative motion between  the 2 fragments.  We had to do quite a bit of dissection and debridement to get down to bone and to get through the soft tissue and the fibrous tissue.  Once we were able to identify the fracture, we freshened up the ends of the fracture site, aligned  them appropriately, used a large clamp to stabilize that.  We draped the C-arm into the field so we could visualize from both AP and lateral planes.  We then selected a 10-hole ALPS locking plate for the lateral aspect of the humerus.  We placed that at  the appropriate height on the humeral head and drilled a guide pin to make sure we were centered appropriately on the head at the appropriate height.  Once we verified that, we went distally to make sure that our plate was aligned appropriately with the  distal fragment.  Once we confirmed that, we placed a nonlocking screw in the distalmost screw hole centered on the distal humerus.  That brought the bone to the plate nicely in compression.  We made sure that our fracture was still  appropriately aligned  with the clamp, which it was, and that was a crab claw clamp.  At this point, we went ahead and placed another bicortical screw.  This one locked into the distal fragment closer to the fracture site, so that locked this in distally.   We were pleased  with that alignment.  We checked again on AP and lateral views, and we had excellent fracture alignment.  We then went ahead and filled the 3 proximal peg holes with the appropriate length smooth pegs, and then filled the remainder of the cortical screws  distally as well as the pegs proximally.  We had an excellent purchase in the proximal fragment and distal fragment despite the extensive cancer involvement in her bone.  I felt like her bone quality was reasonable.  At this point, we packed DBM,  demineralized bone matrix, from the Biomet into the fracture site and around posteriorly to fill any voids that were present at the fracture site.  We made sure there was good bleeding bone there to host that DBM, and we packed it in tightly.  We had  irrigated prior to this and then closed the deltopectoral interval with 0 Vicryl suture, followed by 2-0 Vicryl for subcutaneous closure and staples for skin.  Sterile compressive bandage and a sling applied.  The patient transported to the recovery room  in stable condition.  LN/NUANCE  D:01/09/2019 T:01/09/2019 JOB:[id number]

## 2019-01-09 NOTE — Anesthesia Procedure Notes (Signed)
Anesthesia Regional Block: Interscalene brachial plexus block   Pre-Anesthetic Checklist: ,, timeout performed, Correct Patient, Correct Site, Correct Laterality, Correct Procedure, Correct Position, site marked, Risks and benefits discussed,  Surgical consent,  Pre-op evaluation,  At surgeon's request and post-op pain management  Laterality: Left  Prep: chloraprep       Needles:  Injection technique: Single-shot  Needle Type: Echogenic Stimulator Needle     Needle Length: 5cm  Needle Gauge: 22     Additional Needles:   Procedures:, nerve stimulator,,,,,,,   Nerve Stimulator or Paresthesia:  Response: biceps flexion, 0.45 mA,   Additional Responses:   Narrative:  Start time: 01/09/2019 4:57 PM End time: 01/09/2019 5:02 PM Injection made incrementally with aspirations every 5 mL.  Performed by: Personally  Anesthesiologist: Albertha Ghee, MD  Additional Notes: Functioning IV was confirmed and monitors were applied.  A 64mm 22ga Arrow echogenic stimulator needle was used. Sterile prep and drape,hand hygiene and sterile gloves were used.  Negative aspiration and negative test dose prior to incremental administration of local anesthetic. The patient tolerated the procedure well.  Ultrasound guidance: relevent anatomy identified, needle position confirmed, local anesthetic spread visualized around nerve(s), vascular puncture avoided.  Image printed for medical record.

## 2019-01-09 NOTE — Discharge Instructions (Signed)
No weight bearing with the left arm.  Ok to use the arm for basic daily activities such as feeding and bathing.   Keep the left arm in a sling while up and around, may remove when seated   Keep the incision clean and dry and covered for one week, then ok to get it wet in the shower. Keep a bandage covering the wound.  Follow up with Dr Veverly Fells in two weeks in the office, call [phone] for appt.

## 2019-01-09 NOTE — Anesthesia Procedure Notes (Signed)
Procedure Name: Intubation Date/Time: 01/09/2019 6:08 PM Performed by: Garrel Ridgel, CRNA Pre-anesthesia Checklist: Patient identified, Emergency Drugs available, Suction available, Patient being monitored and Timeout performed Patient Re-evaluated:Patient Re-evaluated prior to induction Oxygen Delivery Method: Circle system utilized Preoxygenation: Pre-oxygenation with 100% oxygen Induction Type: IV induction Ventilation: Mask ventilation without difficulty Laryngoscope Size: Mac and 3 Grade View: Grade I Tube type: Oral Number of attempts: 1 Airway Equipment and Method: Stylet Placement Confirmation: ETT inserted through vocal cords under direct vision,  positive ETCO2 and breath sounds checked- equal and bilateral Secured at: 22 cm Tube secured with: Tape Dental Injury: Teeth and Oropharynx as per pre-operative assessment

## 2019-01-09 NOTE — Brief Op Note (Signed)
01/09/2019  8:06 PM  PATIENT:  Suzanne [name]  58 y.o. female  PRE-OPERATIVE DIAGNOSIS:  left humerus pathologic fracture  POST-OPERATIVE DIAGNOSIS: same  PROCEDURE:  Procedure(s): OPEN REDUCTION INTERNAL FIXATION (ORIF) HUMERAL FRACTURE (Left) Biomet ALPS plate  SURGEON:  Surgeon(s) and Role:    Netta Cedars, MD - Primary  PHYSICIAN ASSISTANT:   ASSISTANTS: [place] Bruns, PA-C   ANESTHESIA:   regional and general  EBL:  50 mL   BLOOD ADMINISTERED:400 CC PRBC  DRAINS: none   LOCAL MEDICATIONS USED:  MARCAINE     SPECIMEN:  No Specimen  DISPOSITION OF SPECIMEN:  N/A  COUNTS:  YES  TOURNIQUET:  * No tourniquets in log *  DICTATION: .Other Dictation: Dictation Number [id number]  PLAN OF CARE: Admit to inpatient   PATIENT DISPOSITION:  PACU - hemodynamically stable.   Delay start of Pharmacological VTE agent (>24hrs) due to surgical blood loss or risk of bleeding: not applicable

## 2019-01-09 NOTE — Anesthesia Postprocedure Evaluation (Signed)
Anesthesia Post Note  Patient: Suzanne [name]  Procedure(s) Performed: OPEN REDUCTION INTERNAL FIXATION (ORIF) HUMERAL FRACTURE (Left )     Patient location during evaluation: PACU Anesthesia Type: General Level of consciousness: awake and oriented Pain management: pain level controlled Vital Signs Assessment: post-procedure vital signs reviewed and stable Respiratory status: patient re-intubated Cardiovascular status: blood pressure returned to baseline and stable Postop Assessment: no apparent nausea or vomiting Anesthetic complications: no Comments: In recovery room, the patient had respiratory difficulties with RR 30-40, SaO2 88-92%.  Respiratory acidosis on ABG.  Labored breathing.  CXR showed increased vascular markings.  Fluid overload is suspected and Lasix 20mg  IV administered with little effect.  She was reintubated as she was clearly getting fatigued with imminent respiratory failure.  She was induced with 20mg  Etomidate and 120mg  Sux.  Easy intubation, bilateral breath sounds.  Sat quickly improved to 100%.  VSS throughout.  Report was given to Critical Care and the patient was transferred to ICU while sedated with propofol gtt.    Last Vitals:  Vitals:   01/09/19 2200 01/09/19 2215  BP: (!) 128/106 (!) 151/97  Pulse: (!) 119 (!) 118  Resp: (!) 38 (!) 41  Temp:    SpO2: (!) 88% 91%    Last Pain:  Vitals:   01/09/19 2215  TempSrc:   PainSc: 0-No pain                 Ajee Heasley S

## 2019-01-09 NOTE — Progress Notes (Signed)
Assisted Dr. Hodierne with left, ultrasound guided, interscalene  block. Side rails up, monitors on throughout procedure. See vital signs in flow sheet. Tolerated Procedure well. 

## 2019-01-09 NOTE — Transfer of Care (Signed)
Immediate Anesthesia Transfer of Care Note  Patient: Suzanne [name]  Procedure(s) Performed: OPEN REDUCTION INTERNAL FIXATION (ORIF) HUMERAL FRACTURE (Left )  Patient Location: PACU  Anesthesia Type:General  Level of Consciousness: awake, alert  and patient cooperative  Airway & Oxygen Therapy: Patient Spontanous Breathing and Patient connected to face mask oxygen  Post-op Assessment: Report given to RN, Post -op Vital signs reviewed and stable and Patient moving all extremities X 4  Post vital signs: stable  Last Vitals:  Vitals Value Taken Time  BP 136/87 01/09/19 2033  Temp 36.4 C 01/09/19 2030  Pulse 110 01/09/19 2043  Resp 28 01/09/19 2043  SpO2 98 % 01/09/19 2043  Vitals shown include unvalidated device data.  Last Pain:  Vitals:   01/09/19 1657  TempSrc:   PainSc: 0-No pain      Patients Stated Pain Goal: 2 (34/03/70 9643)  Complications: No apparent anesthesia complications

## 2019-01-09 NOTE — Progress Notes (Signed)
PROGRESS NOTE    Suzanne [name]  JME:[id number] DOB: Aug 26, 1960 DOA: 12/25/2018 PCP: Patient, No Pcp Per   Brief Narrative: 58 year old with past medical history significant for stage IV breast cancer with known metastasis to bone, lung and lymph nodes and malignant pleural effusion admitted on 12/25/2018 with worsening shortness of breath and altered mental status.  Of note patient was diagnosed with metastatic breast cancer in early June 2020.  At the time of diagnosis she was found to have widespread metastatic disease and pathologic fracture in her right right clavicle.  She subsequently underwent neoadjuvant chemotherapy but has also had her treatment complicated by recurrent malignant right-sided pleural effusion, neutropenia and thrombocytopenia.  In the emergency room patient was noted to be septic, likely secondary to sacral decubital ulcer, blood cultures on admission grew MSSA.  ID was consulted and recommendation was for 2 weeks of Ancef, which finish on 8/14.  Patient was also found to have a pathologic left humeral fracture.  Examination by orthopedic surgery recommended surgery this admission.    Assessment & Plan:   Principal Problem:   Acute on chronic respiratory failure with hypoxia (HCC) Active Problems:   Bone metastasis (HCC)   Pleural effusion, bilateral   Carcinoma of breast, estrogen receptor positive, stage 4, right (HCC)   Pressure injury of skin   Pneumonia   Respiratory failure, acute (HCC)  1-MSSA bacteremia/sepsis; -Sepsis present on admission.  Likely source a stage II/III sacral decubitus ulcer. -ID consultation was obtained.. -Sacral wound did not require any surgical intervention.. -TTE did not show any signs of endocarditis.. -Repeated blood cultures on 7/30 first did not show any growth -Per ID recommendation, patient is currently on 2 weeks course of Ancef until 01/09/2019.   2-Stage IV metastatic breast cancer to bone and lung: -Oncology follow  up appreciated.. -Patient to continue oral chemotherapy with anastrozole, palbociclib and denosumab/xgeva.   3-Pathological left femur fractures: Noted on x-ray obtained on 7/25 as an outpatient for shoulder pain. Seen by radiation oncology.  Apparently patient had an appointment with orthopedic on 8/6 which she missed because she was in the hospital.  Seen by Dr. Veverly Fells with orthopedic who recommended surgery this admission. Patient will complete antibiotics on 8/14. Dr. Maryland Pink discussed case with Dr. Jana Hakim and orthopedic doctor and decision was made to proceed with surgery this admission. For surgery today.   Acute on chronic hypoxic respiratory failure: Patient presented with worsening hypoxia.  Chest x-ray did not show any acute finding.  She has a known right malignant pleural effusion.  Last thoracentesis was 6/20.  Per chart review, there was some concern of possible lymphangitic spread of tumor in the lungs. Patient has been stable on 2 L of oxygen.  She was initially on IV Solu-Medrol which has been discontinued. Resume lower dose home lasix. Monitor renal function.   Anemia of chronic disease, likely related to malignancy. She has received blood transfusion as an outpatient. Currently hemoglobin has been stable around 8. Hb stable.   Elevated d-dimer: CT angios chest was negative for PE, Dopplers of lower extremity negative for DVT.  Elevated alkaline phosphatase: Secondary to metastatic bone disease. Chronic diastolic heart failure: Appears compensated.  Continue with Coreg Resume lasix.   Impaired mobility: Patient will likely require rehab admission  Stage II-3 decubitus ulcer sacrum present on admission Local care.  Pressure Injury 12/25/18 Sacrum Mid Deep Tissue Injury - Purple or maroon localized area of discolored intact skin or blood-filled blister due to damage of underlying  soft tissue from pressure and/or shear. evolving into stage 2 in patchy areas when asse  (Active)  12/25/18 1700  Location: Sacrum  Location Orientation: Mid  Staging: Deep Tissue Injury - Purple or maroon localized area of discolored intact skin or blood-filled blister due to damage of underlying soft tissue from pressure and/or shear.  Wound Description (Comments): evolving into stage 2 in patchy areas when assessed on 8/2  Present on Admission: Yes     Nutrition Problem: Increased nutrient needs Etiology: chronic illness, cancer and cancer related treatments    Signs/Symptoms: estimated needs    Interventions: Ensure Enlive (each supplement provides 350kcal and 20 grams of protein), MVI  Estimated body mass index is 31.71 kg/m as calculated from the following:   Height as of this encounter: 5\' 3"  (1.6 m).   Weight as of this encounter: 81.2 kg.   DVT prophylaxis: Lovenox Code Status: Full code Family Communication: Discussed with patient Disposition Plan: Plan for surgery tomorrow and a skilled nursing facility after Consultants:   Orthopedics  Oncology  Infectious disease  Procedures:   None  Antimicrobials:  IV Ancef from 7/31---8/14  Subjective: She denies dyspnea, chest pain. Awaiting for surgery.   Objective: Vitals:   01/08/19 0441 01/08/19 1353 01/08/19 2221 01/09/19 0603  BP: 130/64 (!) 114/55 124/60 118/60  Pulse: 93 96 96 90  Resp: 18 17 20 16   Temp: 98.5 F (36.9 C) 99.2 F (37.3 C) 98.6 F (37 C) 98.2 F (36.8 C)  TempSrc: Axillary Oral Oral Oral  SpO2: 100% 100% 100% 100%  Weight: 84.6 kg   81.2 kg  Height:        Intake/Output Summary (Last 24 hours) at 01/09/2019 1012 Last data filed at 01/09/2019 0900 Gross per 24 hour  Intake 797.97 ml  Output 1900 ml  Net -1102.03 ml   Filed Weights   01/05/19 0625 01/08/19 0441 01/09/19 0603  Weight: 84.1 kg 84.6 kg 81.2 kg    Examination:  General exam: NAD Respiratory system: CTA Cardiovascular system: S 1, S 2RRR Gastrointestinal system: BS present, soft, , nt  Central nervous system: non focal.  Extremities: symmetric power.  Skin: No rashes, lesions or ulcers    Data Reviewed: I have personally reviewed following labs and imaging studies  CBC: Recent Labs  Lab 01/03/19 0358 01/07/19 1014 01/09/19 0445  WBC 11.8* 9.3 8.7  HGB 8.3* 8.2* 8.2*  HCT 29.2* 28.1* 28.5*  MCV 110.6* 112.0* 113.1*  PLT 412* 376 785   Basic Metabolic Panel: Recent Labs  Lab 01/07/19 1014 01/08/19 0345 01/08/19 0802 01/09/19 0445  NA 140  --  140 139  K 3.2*  --  3.3* 3.8  CL 101  --  102 104  CO2 31  --  29 30  GLUCOSE 143*  --  176* 105*  BUN 16  --  12 9  CREATININE 0.59 0.35* 0.49 0.38*  CALCIUM 8.0*  --  7.7* 7.8*   GFR: Estimated Creatinine Clearance: 76.4 mL/min (A) (by C-G formula based on SCr of 0.38 mg/dL (L)). Liver Function Tests: Recent Labs  Lab 01/07/19 1014  AST 18  ALT 11  ALKPHOS 227*  BILITOT 0.4  PROT 5.9*  ALBUMIN 2.6*   No results for input(s): LIPASE, AMYLASE in the last 168 hours. No results for input(s): AMMONIA in the last 168 hours. Coagulation Profile: No results for input(s): INR, PROTIME in the last 168 hours. Cardiac Enzymes: No results for input(s): CKTOTAL, CKMB, CKMBINDEX, TROPONINI in the  last 168 hours. BNP (last 3 results) No results for input(s): PROBNP in the last 8760 hours. HbA1C: No results for input(s): HGBA1C in the last 72 hours. CBG: No results for input(s): GLUCAP in the last 168 hours. Lipid Profile: No results for input(s): CHOL, HDL, LDLCALC, TRIG, CHOLHDL, LDLDIRECT in the last 72 hours. Thyroid Function Tests: No results for input(s): TSH, T4TOTAL, FREET4, T3FREE, THYROIDAB in the last 72 hours. Anemia Panel: No results for input(s): VITAMINB12, FOLATE, FERRITIN, TIBC, IRON, RETICCTPCT in the last 72 hours. Sepsis Labs: No results for input(s): PROCALCITON, LATICACIDVEN in the last 168 hours.  No results found for this or any previous visit (from the past 240 hour(s)).        Radiology Studies: No results found.      Scheduled Meds: . anastrozole  1 mg Oral Daily  . calcium-vitamin D  1 tablet Oral TID  . carvedilol  6.25 mg Oral BID WC  . chlorhexidine  15 mL Mouth Rinse BID  . Chlorhexidine Gluconate Cloth  6 each Topical Q0600  . docusate sodium  100 mg Oral BID  . enoxaparin (LOVENOX) injection  40 mg Subcutaneous Q24H  . feeding supplement (ENSURE ENLIVE)  237 mL Oral BID BM  . furosemide  20 mg Oral Daily  . loratadine  10 mg Oral Daily  . mouth rinse  15 mL Mouth Rinse q12n4p  . multivitamin with minerals  1 tablet Oral Daily  . nystatin   Topical TID  . sodium chloride flush  3 mL Intravenous Q12H   Continuous Infusions: . sodium chloride Stopped (01/05/19 2300)  .  ceFAZolin (ANCEF) IV 2 g (01/09/19 0525)     LOS: 15 days    Time spent: 35 minutes.     Elmarie Shiley, MD [facility] Pager [phone]  If 7PM-7AM, please contact night-coverage [email or web address] Password [email or web address] 01/09/2019, 10:12 AM

## 2019-01-09 NOTE — Interval H&P Note (Signed)
History and Physical Interval Note:  01/09/2019 5:39 PM  Suzanne [name]  has presented today for surgery, with the diagnosis of fractured left humerus.  The various methods of treatment have been discussed with the patient and family. After consideration of risks, benefits and other options for treatment, the patient has consented to  Procedure(s): OPEN REDUCTION INTERNAL FIXATION (ORIF) HUMERAL FRACTURE (Left) as a surgical intervention.  The patient's history has been reviewed, patient examined, no change in status, stable for surgery.  I have reviewed the patient's chart and labs.  Questions were answered to the patient's satisfaction.     Augustin Schooling

## 2019-01-10 ENCOUNTER — Inpatient Hospital Stay (HOSPITAL_COMMUNITY): Payer: Medicaid Other

## 2019-01-10 DIAGNOSIS — J90 Pleural effusion, not elsewhere classified: Secondary | ICD-10-CM

## 2019-01-10 LAB — TYPE AND SCREEN
ABO/RH(D): O POS
Antibody Screen: NEGATIVE
Unit division: 0

## 2019-01-10 LAB — BLOOD GAS, ARTERIAL
Acid-Base Excess: 5.1 mmol/L — ABNORMAL HIGH (ref 0.0–2.0)
Bicarbonate: 29.4 mmol/L — ABNORMAL HIGH (ref 20.0–28.0)
Drawn by: 422461
FIO2: 70
MECHVT: 410 mL
O2 Saturation: 99.4 %
PEEP: 5 cmH2O
Patient temperature: 97.6
RATE: 20 {breaths}/min
pCO2 arterial: 43 mmHg (ref 32.0–48.0)
pH, Arterial: 7.447 (ref 7.350–7.450)
pO2, Arterial: 150 mmHg — ABNORMAL HIGH (ref 83.0–108.0)

## 2019-01-10 LAB — GLUCOSE, CAPILLARY
Glucose-Capillary: 98 mg/dL (ref 70–99)
Glucose-Capillary: 87 mg/dL (ref 70–99)

## 2019-01-10 LAB — CBC
HCT: 29.2 % — ABNORMAL LOW (ref 36.0–46.0)
Hemoglobin: 9 g/dL — ABNORMAL LOW (ref 12.0–15.0)
MCH: 32.1 pg (ref 26.0–34.0)
MCHC: 30.8 g/dL (ref 30.0–36.0)
MCV: 104.3 fL — ABNORMAL HIGH (ref 80.0–100.0)
Platelets: 341 10*3/uL (ref 150–400)
RBC: 2.8 MIL/uL — ABNORMAL LOW (ref 3.87–5.11)
RDW: 25.2 % — ABNORMAL HIGH (ref 11.5–15.5)
WBC: 9.6 10*3/uL (ref 4.0–10.5)
nRBC: 0.6 % — ABNORMAL HIGH (ref 0.0–0.2)

## 2019-01-10 LAB — TRIGLYCERIDES: Triglycerides: 120 mg/dL (ref <150)

## 2019-01-10 LAB — BASIC METABOLIC PANEL
Anion gap: 9 (ref 5–15)
BUN: 10 mg/dL (ref 6–20)
CO2: 28 mmol/L (ref 22–32)
Calcium: 7.2 mg/dL — ABNORMAL LOW (ref 8.9–10.3)
Chloride: 102 mmol/L (ref 98–111)
Creatinine, Ser: 0.38 mg/dL — ABNORMAL LOW (ref 0.44–1.00)
GFR calc Af Amer: >60 mL/min (ref >60)
GFR calc non Af Amer: >60 mL/min (ref >60)
Glucose, Bld: 135 mg/dL — ABNORMAL HIGH (ref 70–99)
Potassium: 3.8 mmol/L (ref 3.5–5.1)
Sodium: 139 mmol/L (ref 135–145)

## 2019-01-10 LAB — BPAM RBC
Blood Product Expiration Date: 202009022359
ISSUE DATE / TIME: 202008141819
Unit Type and Rh: 5100

## 2019-01-10 MED ORDER — FUROSEMIDE 10 MG/ML IJ SOLN
20.0000 mg | Freq: Two times a day (BID) | INTRAMUSCULAR | Status: DC
  Administered 2019-01-10 – 2019-01-19 (×18): 20 mg via INTRAVENOUS
  Filled 2019-01-10 (×18): qty 2

## 2019-01-10 MED ORDER — ALPRAZOLAM 0.5 MG PO TABS
0.5000 mg | ORAL_TABLET | Freq: Two times a day (BID) | ORAL | Status: DC | PRN
  Administered 2019-01-10 – 2019-01-11 (×3): 0.5 mg via ORAL
  Filled 2019-01-10 (×3): qty 1

## 2019-01-10 MED ORDER — ORAL CARE MOUTH RINSE
15.0000 mL | OROMUCOSAL | Status: DC
  Administered 2019-01-10 (×4): 15 mL via OROMUCOSAL

## 2019-01-10 MED ORDER — FENTANYL CITRATE (PF) 100 MCG/2ML IJ SOLN
50.0000 ug | INTRAMUSCULAR | Status: DC | PRN
  Administered 2019-01-10 – 2019-01-17 (×8): 50 ug via INTRAVENOUS
  Filled 2019-01-10 (×8): qty 2

## 2019-01-10 MED ORDER — CHLORHEXIDINE GLUCONATE 0.12 % MT SOLN
15.0000 mL | Freq: Two times a day (BID) | OROMUCOSAL | Status: DC
  Administered 2019-01-11 – 2019-01-23 (×20): 15 mL via OROMUCOSAL
  Filled 2019-01-10 (×19): qty 15

## 2019-01-10 MED ORDER — CHLORHEXIDINE GLUCONATE 0.12% ORAL RINSE (MEDLINE KIT)
15.0000 mL | Freq: Two times a day (BID) | OROMUCOSAL | Status: DC
  Administered 2019-01-10 (×2): 15 mL via OROMUCOSAL

## 2019-01-10 MED ORDER — ORAL CARE MOUTH RINSE
15.0000 mL | Freq: Two times a day (BID) | OROMUCOSAL | Status: DC
  Administered 2019-01-11 – 2019-01-23 (×12): 15 mL via OROMUCOSAL

## 2019-01-10 MED ORDER — CEFAZOLIN SODIUM-DEXTROSE 2-4 GM/100ML-% IV SOLN
2.0000 g | Freq: Three times a day (TID) | INTRAVENOUS | Status: AC
  Administered 2019-01-10 – 2019-01-22 (×38): 2 g via INTRAVENOUS
  Filled 2019-01-10 (×42): qty 100

## 2019-01-10 NOTE — Progress Notes (Signed)
PROGRESS NOTE    Suzanne [name]  ACZ:[id number] DOB: Jan 06, 1961 DOA: 12/25/2018 PCP: Patient, No Pcp Per   Brief Narrative: 58 year old with past medical history significant for stage IV breast cancer with known metastasis to bone, lung and lymph nodes and malignant pleural effusion admitted on 12/25/2018 with worsening shortness of breath and altered mental status.  Of note patient was diagnosed with metastatic breast cancer in early June 2020.  At the time of diagnosis she was found to have widespread metastatic disease and pathologic fracture in her right right clavicle.  She subsequently underwent neoadjuvant chemotherapy but has also had her treatment complicated by recurrent malignant right-sided pleural effusion, neutropenia and thrombocytopenia.  In the emergency room patient was noted to be septic, likely secondary to sacral decubital ulcer, blood cultures on admission grew MSSA.  ID was consulted and recommendation was for 2 weeks of Ancef, which finish on 8/14.  Patient was also found to have a pathologic left humeral fracture.  Examination by orthopedic surgery recommended surgery this admission.    Assessment & Plan:   Principal Problem:   Acute on chronic respiratory failure with hypoxia (HCC) Active Problems:   Bone metastasis (HCC)   Pleural effusion, bilateral   Carcinoma of breast, estrogen receptor positive, stage 4, right (HCC)   Pressure injury of skin   Pneumonia   Respiratory failure, acute (HCC)   Fracture closed, humerus, shaft   Acute on chronic hypoxic respiratory failure: Patient presented with worsening hypoxia.  Chest x-ray did not show any acute finding.  She has a known right malignant pleural effusion.  Last thoracentesis was 6/20.  Per chart review, there was some concern of possible lymphangitic spread of tumor in the lungs. Patient has been stable on 2 L of oxygen.  She was initially on IV Solu-Medrol which has been discontinued. Patient had to be  re-intubated post surgery. She became hypoxic and respiratory distress.  She was extubated this morning 8-15, place on BIPAP , appreciate CCM assistance.  Diuresis. Lasix 20 IV BID   MSSA bacteremia/sepsis; -Sepsis present on admission.  Likely source a stage II/III sacral decubitus ulcer. -ID consultation was obtained.. -Sacral wound did not require any surgical intervention.. -TTE did not show any signs of endocarditis.. -Repeated blood cultures on 7/30 first did not show any growth -Per ID recommendation, patient is currently on 4 weeks course of Ancef until 8/27    2-Stage IV metastatic breast cancer to bone and lung: -Oncology follow up appreciated.. -Patient to continue oral chemotherapy with anastrozole, palbociclib and denosumab/xgeva.   3-Pathological left femur fractures: Noted on x-ray obtained on 7/25 as an outpatient for shoulder pain. Seen by radiation oncology.  Apparently patient had an appointment with orthopedic on 8/6 which she missed because she was in the hospital.  Seen by Dr. Veverly Fells with orthopedic who recommended surgery this admission. Dr. Maryland Pink discussed case with Dr. Jana Hakim and orthopedic doctor and decision was made to proceed with surgery this admission. S/P left Humerus fracture open reduction and internal fixation using Bionmet ALPS plate    Anemia of chronic disease, likely related to malignancy. She has received blood transfusion as an outpatient. Currently hemoglobin has been stable around 8. Hb stable.   Elevated d-dimer: CT angios chest was negative for PE, Dopplers of lower extremity negative for DVT.  Elevated alkaline phosphatase: Secondary to metastatic bone disease. Chronic diastolic heart failure: Appears compensated.  Continue with Coreg Resume lasix.   Impaired mobility: Patient will likely require rehab admission  Stage II-3 decubitus ulcer sacrum present on admission Local care.  Pressure Injury 12/25/18 Sacrum Mid Deep  Tissue Injury - Purple or maroon localized area of discolored intact skin or blood-filled blister due to damage of underlying soft tissue from pressure and/or shear. evolving into stage 2 in patchy areas when asse (Active)  12/25/18 1700  Location: Sacrum  Location Orientation: Mid  Staging: Deep Tissue Injury - Purple or maroon localized area of discolored intact skin or blood-filled blister due to damage of underlying soft tissue from pressure and/or shear.  Wound Description (Comments): evolving into stage 2 in patchy areas when assessed on 8/2  Present on Admission: Yes     Nutrition Problem: Increased nutrient needs Etiology: chronic illness, cancer and cancer related treatments    Signs/Symptoms: estimated needs    Interventions: Ensure Enlive (each supplement provides 350kcal and 20 grams of protein), MVI  Estimated body mass index is 31.71 kg/m as calculated from the following:   Height as of this encounter: _0  (1.6 m).   Weight as of this encounter: 81.2 kg.   DVT prophylaxis: Lovenox Code Status: Full code Family Communication: Discussed with patient Disposition Plan: Plan for surgery tomorrow and a skilled nursing facility after Consultants:   Orthopedics  Oncology  Infectious disease  Procedures:   None  Antimicrobials:  IV Ancef from 7/31---8/14  Subjective: Intubated, alert, follows command  Objective: Vitals:   01/10/19 0732 01/10/19 0742 01/10/19 0800 01/10/19 0900  BP: 136/69  (!) 160/77 (!) 164/74  Pulse: 84  94 99  Resp: 14  (!) 23 (!) 28  Temp:  97.8 F (36.6 C)    TempSrc:  Oral    SpO2: 100%  100% 100%  Weight:      Height:        Intake/Output Summary (Last 24 hours) at 01/10/2019 0924 Last data filed at 01/10/2019 0900 Gross per 24 hour  Intake 3569.53 ml  Output 350 ml  Net 3219.53 ml   Filed Weights   01/05/19 0625 01/08/19 0441 01/09/19 0603  Weight: 84.1 kg 84.6 kg 81.2 kg    Examination:  General exam: NAD,  Intubated Respiratory system: Bilateral air movement, crackles.  Cardiovascular system: S 1, S 2 RRR Gastrointestinal system: BS present, soft, nt Central nervous system: alert Extremities: symmetric power. .  Skin: no rashes  Data Reviewed: I have personally reviewed following labs and imaging studies  CBC: Recent Labs  Lab 01/07/19 1014 01/09/19 0445 01/09/19 1947 01/10/19 0500  WBC 9.3 8.7  --  9.6  HGB 8.2* 8.2* 8.8* 9.0*  HCT 28.1* 28.5* 26.0* 29.2*  MCV 112.0* 113.1*  --  104.3*  PLT 376 377  --  654   Basic Metabolic Panel: Recent Labs  Lab 01/07/19 1014 01/08/19 0345 01/08/19 0802 01/09/19 0445 01/09/19 1947 01/10/19 0500  NA 140  --  140 139 137 139  K 3.2*  --  3.3* 3.8 3.7 3.8  CL 101  --  102 104  --  102  CO2 31  --  29 30  --  28  GLUCOSE 143*  --  176* 105* 101* 135*  BUN 16  --  12 9  --  10  CREATININE 0.59 0.35* 0.49 0.38*  --  0.38*  CALCIUM 8.0*  --  7.7* 7.8*  --  7.2*   GFR: Estimated Creatinine Clearance: 76.4 mL/min (A) (by C-G formula based on SCr of 0.38 mg/dL (L)). Liver Function Tests: Recent Labs  Lab 01/07/19 1014  AST 18  ALT 11  ALKPHOS 227*  BILITOT 0.4  PROT 5.9*  ALBUMIN 2.6*   No results for input(s): LIPASE, AMYLASE in the last 168 hours. No results for input(s): AMMONIA in the last 168 hours. Coagulation Profile: No results for input(s): INR, PROTIME in the last 168 hours. Cardiac Enzymes: No results for input(s): CKTOTAL, CKMB, CKMBINDEX, TROPONINI in the last 168 hours. BNP (last 3 results) No results for input(s): PROBNP in the last 8760 hours. HbA1C: No results for input(s): HGBA1C in the last 72 hours. CBG: Recent Labs  Lab 01/10/19 0747  GLUCAP 98   Lipid Profile: Recent Labs    01/10/19 0500  TRIG 120   Thyroid Function Tests: No results for input(s): TSH, T4TOTAL, FREET4, T3FREE, THYROIDAB in the last 72 hours. Anemia Panel: No results for input(s): VITAMINB12, FOLATE, FERRITIN, TIBC, IRON,  RETICCTPCT in the last 72 hours. Sepsis Labs: No results for input(s): PROCALCITON, LATICACIDVEN in the last 168 hours.  Recent Results (from the past 240 hour(s))  Surgical pcr screen     Status: Abnormal   Collection Time: 01/09/19  1:49 PM   Specimen: Nasal Mucosa; Nasal Swab  Result Value Ref Range Status   MRSA, PCR NEGATIVE NEGATIVE Final   Staphylococcus aureus POSITIVE (A) NEGATIVE Final    Comment: (NOTE) The Xpert SA Assay (FDA approved for NASAL specimens in patients 99 years of age and older), is one component of a comprehensive surveillance program. It is not intended to diagnose infection nor to guide or monitor treatment. Performed at [facility], [place] [address], [place], [place] [address]          Radiology Studies: Dg Chest Port 1 View  Result Date: 01/10/2019 CLINICAL DATA:  Intubation and OG tube placement. EXAM: PORTABLE CHEST 1 VIEW COMPARISON:  Radiograph yesterday. FINDINGS: Endotracheal tube tip 2.4 cm from the carina. Enteric tube tip below the diaphragm not included in the field of view. Tip of the right upper extremity PICC in the upper SVC. Low lung volumes persist with slight decrease patchy opacities from prior exam. Persistent blunting of both costophrenic angles. Unchanged heart size and mediastinal contours. Bony metastatic disease again seen. Recent left humeral fixation is partially included. IMPRESSION: 1. Endotracheal tube tip 2.4 cm from the carina. Enteric tube tip below the diaphragm not included in the field of view. 2. Persistent low lung volumes. Slight improvement in bilateral patchy opacities from prior exam. Electronically Signed   By: Keith Rake M.D.   On: 01/10/2019 01:06   Dg Chest Port 1 View  Result Date: 01/09/2019 CLINICAL DATA:  Shortness of breath. EXAM: PORTABLE CHEST 1 VIEW COMPARISON:  Radiograph 01/05/2019, chest CT 12/25/2018 FINDINGS: Very low lung volumes limit assessment, lower lung volumes from  prior exam. Right upper extremity PICC tip in the SVC. Cardiomediastinal contours are grossly stable, not well visualized due to low lung volumes. Progression in bilateral lung opacities, right perihilar and left lung base. Blunting of the costophrenic angles likely effusions. Osseous metastatic disease again seen. IMPRESSION: 1. Very low lung volumes, limited assessment lower lung volumes from prior exam. 2. Progression in bilateral lung opacities, right perihilar and left lung base, may be combination of pulmonary edema and pneumonia. Suspected pleural effusions. Electronically Signed   By: Keith Rake M.D.   On: 01/09/2019 22:14   Dg Shoulder Left Port  Result Date: 01/09/2019 CLINICAL DATA:  Postop EXAM: LEFT SHOULDER - 1 VIEW COMPARISON:  01/05/2019 FINDINGS: Interval surgical plate and  multiple screw fixation of the left humerus for pathologic proximal humerus fracture. Near anatomic alignment. No humeral head dislocation. Numerous lytic and sclerotic lesions consistent with metastatic disease. Amorphous opacities the proximal humerus at the level of the fracture, presumed to represent surgical packing material. IMPRESSION: Interval surgical plate and multiple screw fixation of proximal left humerus fracture with expected operative changes. Numerous lytic and sclerotic osseous lesions, consistent with skeletal metastatic disease Electronically Signed   By: Donavan Foil M.D.   On: 01/09/2019 21:13   Dg Abd Portable 1v  Result Date: 01/10/2019 CLINICAL DATA:  OG tube placement. EXAM: PORTABLE ABDOMEN - 1 VIEW COMPARISON:  None. FINDINGS: Tip and side port of the enteric tube below the diaphragm in the stomach. Relative paucity of upper abdominal bowel gas. No evidence of free air or obstruction. IMPRESSION: Tip and side port of the enteric tube below the diaphragm in the stomach. Electronically Signed   By: Keith Rake M.D.   On: 01/10/2019 01:07        Scheduled Meds: . sodium  chloride   Intravenous Once  . anastrozole  1 mg Oral Daily  . calcium-vitamin D  1 tablet Oral TID  . carvedilol  6.25 mg Oral BID WC  . chlorhexidine gluconate (MEDLINE KIT)  15 mL Mouth Rinse BID  . Chlorhexidine Gluconate Cloth  6 each Topical Q0600  . docusate sodium  100 mg Oral BID  . enoxaparin (LOVENOX) injection  40 mg Subcutaneous Q24H  . feeding supplement (ENSURE ENLIVE)  237 mL Oral BID BM  . furosemide  20 mg Oral Daily  . loratadine  10 mg Oral Daily  . mouth rinse  15 mL Mouth Rinse 10 times per day  . multivitamin with minerals  1 tablet Oral Daily  . nystatin   Topical TID  . pantoprazole (PROTONIX) IV  40 mg Intravenous Daily  . potassium chloride  20 mEq Oral Daily  . sodium chloride flush  3 mL Intravenous Q12H   Continuous Infusions: . sodium chloride Stopped (01/05/19 2300)  . sodium chloride 75 mL/hr at 01/10/19 0900  . propofol (DIPRIVAN) infusion 15 mcg/kg/min (01/10/19 0900)     LOS: 16 days    Time spent: 35 minutes.     Elmarie Shiley, MD [facility] Pager [phone]  If 7PM-7AM, please contact night-coverage [email or web address] Password [facility] 01/10/2019, 9:24 AM

## 2019-01-10 NOTE — Progress Notes (Signed)
Pt's incision site is bleeding, dressing changed with new gauze and ABD pad. Sling removed to be washed due to bleeding. Ice applied to shoulder. Pt is very tearful and stating she does not want to wear the bipap. Bipap removed and nasal cannula at 4 liters placed on pt. Pt states she takes xanax at home for anxiety and would like some. Pt having pain in the shoulder 4/10 from dressing change. Fentanyl 63mcg given. Dr Elsworth Soho called and prn for  Xanax given. Macy RT notified that bipap was removed and pt placed on nasal cannula.

## 2019-01-10 NOTE — Progress Notes (Signed)
NAME:  Suzanne [name], MRN:  [id number], DOB:  04-Sep-1960, LOS: 84 ADMISSION DATE:  12/25/2018, CONSULTATION DATE:  8/14 REFERRING MD:  Tyrell Antonio [facility]), CHIEF COMPLAINT:  Vent management    Brief History   58yo female with hx Stage IV breast cancer with known mets to bone and lung with malignant effusion undergoing current oral chemo (just dx June 2020).  She was initially admitted 7/30 with AMS and dyspnea.  She was admitted by hospitalist with concern for sepsis r/t sacral decub. Blood cultures this admit revealed MSSA bacteremia and she received 2 weeks of ancef compelted on 8/14.  She was found to have pathologic L humeral fx and underwent surgical repair ORIF 8/14 after abx course was complete. She was extubated post op but developed respiratory distress with tachypnea, hypoxia, respiratory acidosis, ?pulmonary edema and was reintubated in PACU.  PCCM called for vent management.    Significant Hospital Events   8/14 ORIF L humerus fx , Reintubated in PACU  Consults:  Ortho  ID  Heme/onc   Procedures: L humeral fx repair    Significant Diagnostic Tests:   CT angio chest 7/30 >> Small bilateral pleural effusions. Effusion on the left has decreased since the prior CT. Bandlike opacities in both lungs may be due to scar or fibrosis,unchanged. Diffuse osseous metastatic disease and multiple pathologic fractures as seen prior   6/20 [place] thora LT >> 400 cc 6/3 [place] thora LT >> 350 cc 6/2 [place] thora RT >> 600cc , malignant    Micro Data:  BC x 2 7/31>>> neg  BC x 2 7/30>>> MSSA   Antimicrobials:  Ancef 7/30>>>8/14  Interim history/subjective:    Remains intubated, critically ill Sedated on propofol Afebrile   Objective   Blood pressure (!) 164/74, pulse 99, temperature 97.8 F (36.6 C), temperature source Oral, resp. rate (!) 28, height 5\' 3"  (1.6 m), weight 81.2 kg, last menstrual period 05/28/2013, SpO2 100 %.    Vent Mode: PSV;CPAP FiO2 (%):  [40 %-100 %] 40 % Set  Rate:  [20 bmp] 20 bmp Vt Set:  [410 mL] 410 mL PEEP:  [5 cmH20] 5 cmH20 Pressure Support:  [10 cmH20-14 cmH20] 10 cmH20 Plateau Pressure:  [18 cmH20-22 cmH20] 18 cmH20   Intake/Output Summary (Last 24 hours) at 01/10/2019 1005 Last data filed at 01/10/2019 0900 Gross per 24 hour  Intake 3569.53 ml  Output 350 ml  Net 3219.53 ml   Filed Weights   01/05/19 0625 01/08/19 0441 01/09/19 0603  Weight: 84.1 kg 84.6 kg 81.2 kg    Examination: General: Well-developed female in no distress intubated sedated HENT: Mild pallor, no icterus Lungs: Creased breath sounds bilateral no crackles no rhonchi Cardiovascular: Regular rate and rhythm no murmurs gallops Abdomen: Soft and nontender Extremities: Left arm in a sling Neuro: Follows commands and able to move all 4 extremities GU: Good urine  Chest x-ray 8/15 personally reviewed, bibasal atelectasis and effusions, elevated right hemidiaphragm  Resolved Hospital Problem list   MSSA bacteremia  TTE without signs of endocarditis , blood cultures negative 2 weeks ancef completed 8/14   Assessment & Plan:  Acute on chronic hypoxic respiratory failure -failed extubation post op ortho surgery  Malignant pleural effusion due to  Metastatic breast cancer +/- pulmonary edema with underlying chronic diastolic heart failure.  Elevated right hemidiaphragm  PLAN -  Spontaneous breathing trial with plan to extubate to BiPAP Diuresis No current indication for thoracentesis   Stage IV metastatic breast ca  PLAN  Per heme/onc and primary  Continue chemo    Pathologic L humerus fx - s/p surgical repair 8/14 PLAN -  Per ortho    Other issues per TR[facility] to remain primary   Summary -due to elevated right hemidiaphragm and effusions, may be best to extubate to BiPAP, and then gradually wean off BiPAP for next 24 hours   Best practice:  Diet: NPO  Pain/Anxiety/Delirium protocol (if indicated): PAD protocol - fentanyl, propofol  VAP protocol  (if indicated): ordered  DVT prophylaxis: lovenox GI prophylaxis: PPI Glucose control: n/a Mobility: BR Code Status: full Family Communication: updated per ortho 8/14 Disposition: ICU   The patient is critically ill with multiple organ systems failure and requires high complexity decision making for assessment and support, frequent evaluation and titration of therapies, application of advanced monitoring technologies and extensive interpretation of multiple databases. Critical Care Time devoted to patient care services described in this note independent of APP/resident  time is 35 minutes.    Kara Mead MD. Shade Flood. [facility] Pager [phone] If no response call [phone]   01/10/2019

## 2019-01-10 NOTE — Progress Notes (Signed)
OT Cancellation Note  Patient Details Name: LIANETTE BROUSSARD MRN: [id number] DOB: 02-04-1961   Cancelled Treatment:    Reason Eval/Treat Not Completed: Medical issues which prohibited therapy, RN reports pt on the vent, advised to follow up next day for an evaluation.    Minus Breeding, MSOT, OTR/L  [facility]  [phone]   Marius Ditch 01/10/2019, 10:10 AM

## 2019-01-10 NOTE — Progress Notes (Signed)
   Subjective:  Patient reports pain as mild to moderate.  Intubated but alert.  No complaints  Objective:   VITALS:   Vitals:   01/10/19 0732 01/10/19 0742 01/10/19 0800 01/10/19 0900  BP: 136/69  (!) 160/77 (!) 164/74  Pulse: 84  94 99  Resp: 14  (!) 23 (!) 28  Temp:  97.8 F (36.6 C)    TempSrc:  Oral    SpO2: 100%  100% 100%  Weight:      Height:        Neurologically intact Neurovascular intact Sensation intact distally Intact pulses distally Incision: no drainage sling in place   Lab Results  Component Value Date   WBC 9.6 01/10/2019   HGB 9.0 (L) 01/10/2019   HCT 29.2 (L) 01/10/2019   MCV 104.3 (H) 01/10/2019   PLT 341 01/10/2019   BMET    Component Value Date/Time   NA 139 01/10/2019 0500   K 3.8 01/10/2019 0500   CL 102 01/10/2019 0500   CO2 28 01/10/2019 0500   GLUCOSE 135 (H) 01/10/2019 0500   BUN 10 01/10/2019 0500   CREATININE 0.38 (L) 01/10/2019 0500   CREATININE 0.63 12/22/2018 1219   CALCIUM 7.2 (L) 01/10/2019 0500   GFRNONAA >60 01/10/2019 0500   GFRNONAA >60 12/22/2018 1219   GFRAA >60 01/10/2019 0500   GFRAA >60 12/22/2018 1219     Assessment/Plan: 1 Day Post-Op   Principal Problem:   Acute on chronic respiratory failure with hypoxia (HCC) Active Problems:   Bone metastasis (HCC)   Pleural effusion, bilateral   Carcinoma of breast, estrogen receptor positive, stage 4, right (HCC)   Pressure injury of skin   Pneumonia   Respiratory failure, acute (HCC)   Fracture closed, humerus, shaft   Up with therapy - sling and NWB to LUE - maintain dressing for now.   - ok for pendulums and scapular retractions with OT and elbow, hand /wrist ROM as tolerated    Nicholes Stairs 01/10/2019, 9:45 AM   Geralynn Rile, MD [phone]

## 2019-01-10 NOTE — Progress Notes (Signed)
Per pt's request I called daughter [name] to give her update and let her know pt is off the ventilator and on bipap at this time

## 2019-01-10 NOTE — Procedures (Signed)
Extubation Procedure Note  Patient Details:   Name: Suzanne [name] DOB: 1960-10-26 MRN: [id number]   Airway Documentation:    Vent end date: (not recorded) Vent end time: (not recorded)   Evaluation  O2 sats: stable throughout Complications: No apparent complications Patient did tolerate procedure well. Bilateral Breath Sounds: Clear, Diminished   Yes   Pt extubated to Bi-PAP 10/5 at 40% FIO2 per MD order. Pt is stable at this time, no apparent complications. Cuff leak heard prior to procedure with RN and RT in room. Pt able to speak. Pt appears comfortable on Bi-PAP settings. RT will continue to monitor.   Esperanza Sheets T 01/10/2019, 10:55 AM

## 2019-01-11 LAB — CBC
HCT: 29.7 % — ABNORMAL LOW (ref 36.0–46.0)
Hemoglobin: 8.9 g/dL — ABNORMAL LOW (ref 12.0–15.0)
MCH: 32 pg (ref 26.0–34.0)
MCHC: 30 g/dL (ref 30.0–36.0)
MCV: 106.8 fL — ABNORMAL HIGH (ref 80.0–100.0)
Platelets: 345 10*3/uL (ref 150–400)
RBC: 2.78 MIL/uL — ABNORMAL LOW (ref 3.87–5.11)
RDW: 25.3 % — ABNORMAL HIGH (ref 11.5–15.5)
WBC: 9.7 10*3/uL (ref 4.0–10.5)
nRBC: 0.4 % — ABNORMAL HIGH (ref 0.0–0.2)

## 2019-01-11 LAB — HEPATIC FUNCTION PANEL
ALT: 8 U/L (ref 0–44)
AST: 15 U/L (ref 15–41)
Albumin: 2.9 g/dL — ABNORMAL LOW (ref 3.5–5.0)
Alkaline Phosphatase: 213 U/L — ABNORMAL HIGH (ref 38–126)
Bilirubin, Direct: 0.1 mg/dL (ref 0.0–0.2)
Indirect Bilirubin: 0.5 mg/dL (ref 0.3–0.9)
Total Bilirubin: 0.6 mg/dL (ref 0.3–1.2)
Total Protein: 6.3 g/dL — ABNORMAL LOW (ref 6.5–8.1)

## 2019-01-11 LAB — BASIC METABOLIC PANEL
Anion gap: 9 (ref 5–15)
BUN: 10 mg/dL (ref 6–20)
CO2: 29 mmol/L (ref 22–32)
Calcium: 6.1 mg/dL — CL (ref 8.9–10.3)
Chloride: 101 mmol/L (ref 98–111)
Creatinine, Ser: 0.38 mg/dL — ABNORMAL LOW (ref 0.44–1.00)
GFR calc Af Amer: >60 mL/min (ref >60)
GFR calc non Af Amer: >60 mL/min (ref >60)
Glucose, Bld: 95 mg/dL (ref 70–99)
Potassium: 2.9 mmol/L — ABNORMAL LOW (ref 3.5–5.1)
Sodium: 139 mmol/L (ref 135–145)

## 2019-01-11 MED ORDER — POTASSIUM CHLORIDE CRYS ER 20 MEQ PO TBCR
40.0000 meq | EXTENDED_RELEASE_TABLET | Freq: Two times a day (BID) | ORAL | Status: DC
  Administered 2019-01-11: 40 meq via ORAL
  Filled 2019-01-11: qty 2

## 2019-01-11 MED ORDER — CHLORHEXIDINE GLUCONATE CLOTH 2 % EX PADS
6.0000 | MEDICATED_PAD | Freq: Every day | CUTANEOUS | Status: DC
  Administered 2019-01-11 – 2019-01-23 (×10): 6 via TOPICAL

## 2019-01-11 MED ORDER — METOPROLOL TARTRATE 5 MG/5ML IV SOLN
2.5000 mg | Freq: Two times a day (BID) | INTRAVENOUS | Status: DC
  Administered 2019-01-11 – 2019-01-12 (×2): 2.5 mg via INTRAVENOUS
  Filled 2019-01-11 (×2): qty 5

## 2019-01-11 MED ORDER — POTASSIUM CHLORIDE 10 MEQ/100ML IV SOLN
10.0000 meq | INTRAVENOUS | Status: AC
  Administered 2019-01-11 (×3): 10 meq via INTRAVENOUS
  Filled 2019-01-11 (×3): qty 100

## 2019-01-11 MED ORDER — POTASSIUM CHLORIDE 10 MEQ/100ML IV SOLN
10.0000 meq | INTRAVENOUS | Status: AC
  Administered 2019-01-11 (×3): 10 meq via INTRAVENOUS
  Filled 2019-01-11 (×3): qty 100

## 2019-01-11 MED ORDER — CALCIUM GLUCONATE-NACL 2-0.675 GM/100ML-% IV SOLN
2.0000 g | Freq: Once | INTRAVENOUS | Status: AC
  Administered 2019-01-11: 2000 mg via INTRAVENOUS
  Filled 2019-01-11: qty 100

## 2019-01-11 MED ORDER — GLYCOPYRROLATE 0.2 MG/ML IJ SOLN
0.2000 mg | Freq: Once | INTRAMUSCULAR | Status: AC
  Administered 2019-01-11: 0.2 mg via INTRAVENOUS
  Filled 2019-01-11: qty 1

## 2019-01-11 MED ORDER — LORAZEPAM 2 MG/ML IJ SOLN
0.5000 mg | Freq: Two times a day (BID) | INTRAMUSCULAR | Status: DC | PRN
  Administered 2019-01-11: 0.5 mg via INTRAVENOUS
  Filled 2019-01-11: qty 1

## 2019-01-11 NOTE — Evaluation (Signed)
Occupational Therapy Evaluation Patient Details Name: Suzanne [name] MRN: [id number] DOB: 09/02/1960 Today's Date: 01/11/2019    History of Present Illness Pt admitted with acute on chronic respiratory failure and hx of breast CA with mets to bone (currently with L UE in sling 2* pathologic fx) and lungs, chronic diastolic heart failure, anemia and pleural effusion. Pt now s/p LUE ORIF 8/14 and off vent 8/16   Clinical Impression   Pt admitted with above diagnoses, post surgical pain, debility, and cardiopulmonary complications limiting ability to engage in BADL at desired level of ind. PTA pt was living with dtr, with intermittent assist for more dynamic tasks. At time of eval she is mod A for bed mobility and min A for OOB t/fs using QC. Pt off of vent 8/15, recovering with hoarse voice and low activity tolerance this date. Educated pt on shoulder instructions (see below for details). Pt unable to tolerate pendulums or lap slides this date. Pt also with difficulty of ROM at elbow, only able to tolerate x2 reps. At this time recommend SNF level of care at d/c to support safe and functional ADL progression prior to return home. Will continue to follow while acute per POC listed below.     Follow Up Recommendations  SNF    Equipment Recommendations  Other (comment)(defer to next venue)    Recommendations for Other Services       Precautions / Restrictions Precautions Precautions: Fall;Shoulder Type of Shoulder Precautions: Elbow, wrist, hand ROM ok; Pendulums and scap retraction ok; No AROM/PROM to shoulder Shoulder Interventions: Shoulder sling/immobilizer Precaution Booklet Issued: Yes (comment) Precaution Comments: h/o given and reviewed precuations Required Braces or Orthoses: Sling Restrictions Weight Bearing Restrictions: Yes LUE Weight Bearing: Non weight bearing      Mobility Bed Mobility Overal bed mobility: Needs Assistance Bed Mobility: Supine to Sit     Supine to  sit: Mod assist     General bed mobility comments: mod A to pull to sit with RUE, as well as assist for BLE translation to EOB. Needed assist to scoot hips with bed pad  Transfers Overall transfer level: Needs assistance Equipment used: Quad cane Transfers: Sit to/from Stand Sit to Stand: Min assist         General transfer comment: min A to power up and steady    Balance Overall balance assessment: Needs assistance Sitting-balance support: No upper extremity supported;Feet supported Sitting balance-Leahy Scale: Good     Standing balance support: Single extremity supported Standing balance-Leahy Scale: Poor Standing balance comment: requires steadying assist                           ADL either performed or assessed with clinical judgement   ADL Overall ADL's : Needs assistance/impaired Eating/Feeding: Minimal assistance;Sitting Eating/Feeding Details (indicate cue type and reason): package manipulation Grooming: Minimal assistance;Sitting;Standing Grooming Details (indicate cue type and reason): rest breaks needed; assist to open containers Upper Body Bathing: Maximal assistance;Sitting;Adhering to UE precautions   Lower Body Bathing: Maximal assistance;Sit to/from stand;Sitting/lateral leans   Upper Body Dressing : Maximal assistance;Sitting;Adhering to UE precautions Upper Body Dressing Details (indicate cue type and reason): to manage sling Lower Body Dressing: Maximal assistance;Sit to/from stand;Sitting/lateral leans   Toilet Transfer: Minimal assistance;BSC;Stand-pivot(QC) Toilet Transfer Details (indicate cue type and reason): simulated with recliner in room, stand pivot to chair with QC Toileting- Clothing Manipulation and Hygiene: Minimal assistance;Sit to/from stand;Sitting/lateral lean   Tub/ Shower Transfer: Minimal assistance;Shower seat;Ambulation  Functional mobility during ADLs: Minimal assistance;Cane(QC) General ADL Comments: pt limited  2/2 decreased activity tolerance with poor cardiopulmonary status as well as recent LUE ORIF complications     Vision Baseline Vision/History: No visual deficits       Perception     Praxis      Pertinent Vitals/Pain Pain Assessment: Faces Faces Pain Scale: Hurts little more Pain Location: LUE Pain Descriptors / Indicators: Discomfort;Grimacing Pain Intervention(s): Limited activity within patient's tolerance;Monitored during session;Repositioned     Hand Dominance Right   Extremity/Trunk Assessment Upper Extremity Assessment Upper Extremity Assessment: LUE deficits/detail;RUE deficits/detail RUE Deficits / Details: generalized weakness LUE Deficits / Details: Recent ORIF;able to tolerate wrist/hand movement; less toelrable of elbow movement LUE: Unable to fully assess due to immobilization   Lower Extremity Assessment Lower Extremity Assessment: Defer to PT evaluation       Communication Communication Communication: Other (comment)(soft tone and hoarse after taken off of vent)   Cognition Arousal/Alertness: Awake/alert Behavior During Therapy: WFL for tasks assessed/performed Overall Cognitive Status: Within Functional Limits for tasks assessed                                     General Comments       Exercises Shoulder Exercises Elbow Flexion: AROM;AAROM;Left;Seated(able to tolerate x2) Elbow Extension: AROM;AAROM;Seated(able to tolerate x2) Wrist Flexion: AROM;10 reps;Seated Wrist Extension: AROM;10 reps;Seated Digit Composite Flexion: AROM;10 reps;Seated Composite Extension: AROM;10 reps;Seated   Shoulder Instructions Shoulder Instructions Donning/doffing shirt without moving shoulder: Moderate assistance Method for sponge bathing under operated UE: Moderate assistance Donning/doffing sling/immobilizer: Maximal assistance Pendulum exercises (written home exercise program): (pt unable to tolerate this date) ROM for elbow, wrist and digits  of operated UE: Minimal assistance(min A for elbow ROM) Sling wearing schedule (on at all times/off for ADL's): Modified independent Proper positioning of operated UE when showering: Moderate assistance Positioning of UE while sleeping: Minimal assistance    Home Living Family/patient expects to be discharged to:: Unsure Living Arrangements: Children                               Additional Comments: pt lives with dtr who has CP; cont to be hopeful for SNF placement      Prior Functioning/Environment Level of Independence: Independent with assistive device(s)        Comments: limited due to decreased endurance        OT Problem List: Decreased strength;Decreased knowledge of use of DME or AE;Obesity;Decreased range of motion;Decreased activity tolerance;Impaired UE functional use;Impaired balance (sitting and/or standing);Pain      OT Treatment/Interventions: Self-care/ADL training;Therapeutic exercise;Patient/family education;Balance training;Energy conservation;Therapeutic activities;DME and/or AE instruction    OT Goals(Current goals can be found in the care plan section) Acute Rehab OT Goals Patient Stated Goal: Short term rehab OT Goal Formulation: With patient Time For Goal Achievement: 01/25/19 Potential to Achieve Goals: Good  OT Frequency: Min 2X/week   Barriers to D/C:            Co-evaluation              AM-PAC OT "6 Clicks" Daily Activity     Outcome Measure Help from another person eating meals?: A Little Help from another person taking care of personal grooming?: A Little Help from another person toileting, which includes using toliet, bedpan, or urinal?: A Little Help from another person bathing (  including washing, rinsing, drying)?: A Lot Help from another person to put on and taking off regular upper body clothing?: A Lot Help from another person to put on and taking off regular lower body clothing?: A Lot 6 Click Score: 15   End  of Session Equipment Utilized During Treatment: Gait belt;Other (comment);Oxygen(QC) Nurse Communication: Mobility status  Activity Tolerance: Patient tolerated treatment well Patient left: in chair;with call bell/phone within reach;with chair alarm set;with nursing/sitter in room  OT Visit Diagnosis: Other abnormalities of gait and mobility (R26.89);Muscle weakness (generalized) (M62.81);Unsteadiness on feet (R26.81);Pain Pain - Right/Left: Left Pain - part of body: Shoulder;Arm                Time: 1052-1140 OT Time Calculation (min): 48 min Charges:  OT General Charges $OT Visit: 1 Visit OT Evaluation $OT Eval Moderate Complexity: 1 Mod OT Treatments $Self Care/Home Management : 23-37 mins  Zenovia Jarred, MSOT, OTR/L Behavioral Health OT/ Acute Relief OT [facility] Office: [phone]  Zenovia Jarred 01/11/2019, 12:52 PM

## 2019-01-11 NOTE — Progress Notes (Addendum)
PROGRESS NOTE    Suzanne [name]  OVF:[id number] DOB: Aug 25, 1960 DOA: 12/25/2018 PCP: Patient, No Pcp Per   Brief Narrative: 58 year old with past medical history significant for stage IV breast cancer with known metastasis to bone, lung and lymph nodes and malignant pleural effusion admitted on 12/25/2018 with worsening shortness of breath and altered mental status.  Of note patient was diagnosed with metastatic breast cancer in early June 2020.  At the time of diagnosis she was found to have widespread metastatic disease and pathologic fracture in her right right clavicle.  She subsequently underwent neoadjuvant chemotherapy but has also had her treatment complicated by recurrent malignant right-sided pleural effusion, neutropenia and thrombocytopenia.  In the emergency room patient was noted to be septic, likely secondary to sacral decubital ulcer, blood cultures on admission grew MSSA.  ID was consulted and recommendation was for 2 weeks of Ancef, which finish on 8/14.  Patient was also found to have a pathologic left humeral fracture.  Examination by orthopedic surgery recommended surgery this admission.    Assessment & Plan:   Principal Problem:   Acute on chronic respiratory failure with hypoxia (HCC) Active Problems:   Bone metastasis (HCC)   Pleural effusion, bilateral   Carcinoma of breast, estrogen receptor positive, stage 4, right (HCC)   Pressure injury of skin   Pneumonia   Respiratory failure, acute (HCC)   Fracture closed, humerus, shaft   Acute on chronic hypoxic respiratory failure: Patient presented with worsening hypoxia.  Chest x-ray did not show any acute finding.  She has a known right malignant pleural effusion.  Last thoracentesis was 6/20.  Per chart review, there was some concern of possible lymphangitic spread of tumor in the lungs. Patient has been stable on 2 L of oxygen.  She was initially on IV Solu-Medrol which has been discontinued. Patient had to be  re-intubated post surgery. She became hypoxic and respiratory distress.  She was extubated this morning 8-15, place on BIPAP , appreciate CCM assistance.  Diuresis. Lasix 20 IV BID. CCM recommending BIPAP for  4 hours during the day and bedtime for next 2--3 days.  Continue with diuresis.   MSSA bacteremia/sepsis; -Sepsis present on admission.  Likely source a stage II/III sacral decubitus ulcer. -ID consultation was obtained.. -Sacral wound did not require any surgical intervention.. -TTE did not show any signs of endocarditis.. -Repeated blood cultures on 7/30 first did not show any growth -Per ID recommendation, patient is currently on 4 weeks course of Ancef until 8/27    2-Stage IV metastatic breast cancer to bone and lung: -Oncology follow up appreciated.. -Patient to continue oral chemotherapy with anastrozole, palbociclib and denosumab/xgeva.   3-Pathological left femur fractures: Noted on x-ray obtained on 7/25 as an outpatient for shoulder pain. Seen by radiation oncology.  Apparently patient had an appointment with orthopedic on 8/6 which she missed because she was in the hospital.  Seen by Dr. Veverly Fells with orthopedic who recommended surgery this admission. Dr. Maryland Pink discussed case with Dr. Jana Hakim and orthopedic doctor and decision was made to proceed with surgery this admission. S/P left Humerus fracture open reduction and internal fixation using Bionmet ALPS plate    Hypokalemia; replete orally and IV Hypocalcemia;  Replete IV.   Anemia of chronic disease, likely related to malignancy. She has received blood transfusion as an outpatient. Currently hemoglobin has been stable around 8. Hb stable.   Elevated d-dimer: CT angios chest was negative for PE, Dopplers of lower extremity negative for DVT.  Elevated alkaline phosphatase: Secondary to metastatic bone disease. Chronic diastolic heart failure: Appears compensated.  Continue with Coreg Resume lasix.    Impaired mobility: Patient will likely require rehab admission  Stage II-3 decubitus ulcer sacrum present on admission Local care.  Pressure Injury 12/25/18 Sacrum Mid Deep Tissue Injury - Purple or maroon localized area of discolored intact skin or blood-filled blister due to damage of underlying soft tissue from pressure and/or shear. evolving into stage 2 in patchy areas when asse (Active)  12/25/18 1700  Location: Sacrum  Location Orientation: Mid  Staging: Deep Tissue Injury - Purple or maroon localized area of discolored intact skin or blood-filled blister due to damage of underlying soft tissue from pressure and/or shear.  Wound Description (Comments): evolving into stage 2 in patchy areas when assessed on 8/2  Present on Admission: Yes     Nutrition Problem: Increased nutrient needs Etiology: chronic illness, cancer and cancer related treatments    Signs/Symptoms: estimated needs    Interventions: Ensure Enlive (each supplement provides 350kcal and 20 grams of protein), MVI  Estimated body mass index is 32.37 kg/m as calculated from the following:   Height as of this encounter: 5\' 3"  (1.6 m).   Weight as of this encounter: 82.9 kg.   DVT prophylaxis: Lovenox Code Status: Full code Family Communication: Discussed with patient Disposition Plan: needs PT, SNF Consultants:   Orthopedics  Oncology  Infectious disease  Procedures:   None  Antimicrobials:  IV Ancef from 7/31---8/14  Subjective: She is breathing better, use BIPAP few hours last night.  Denies abdominal pain.   Objective: Vitals:   01/11/19 0001 01/11/19 0100 01/11/19 0451 01/11/19 0607  BP:  125/64    Pulse:  100    Resp:  18    Temp: 98 F (36.7 C)  97.9 F (36.6 C)   TempSrc: Axillary  Axillary   SpO2:  100%    Weight:    82.9 kg  Height:        Intake/Output Summary (Last 24 hours) at 01/11/2019 0714 Last data filed at 01/10/2019 2332 Gross per 24 hour  Intake 528.55 ml   Output 1500 ml  Net -971.45 ml   Filed Weights   01/08/19 0441 01/09/19 0603 01/11/19 0607  Weight: 84.6 kg 81.2 kg 82.9 kg    Examination:  General exam: NAD Respiratory system: Bilateral air movement, crackles bases.  Cardiovascular system: S 1, S 2 RRR Gastrointestinal system: BS present, soft, nt Central nervous system: Alert.  Extremities: Symmetric power.   Skin: No rashes.   Data Reviewed: I have personally reviewed following labs and imaging studies  CBC: Recent Labs  Lab 01/07/19 1014 01/09/19 0445 01/09/19 1947 01/10/19 0500  WBC 9.3 8.7  --  9.6  HGB 8.2* 8.2* 8.8* 9.0*  HCT 28.1* 28.5* 26.0* 29.2*  MCV 112.0* 113.1*  --  104.3*  PLT 376 377  --  024   Basic Metabolic Panel: Recent Labs  Lab 01/07/19 1014 01/08/19 0345 01/08/19 0802 01/09/19 0445 01/09/19 1947 01/10/19 0500  NA 140  --  140 139 137 139  K 3.2*  --  3.3* 3.8 3.7 3.8  CL 101  --  102 104  --  102  CO2 31  --  29 30  --  28  GLUCOSE 143*  --  176* 105* 101* 135*  BUN 16  --  12 9  --  10  CREATININE 0.59 0.35* 0.49 0.38*  --  0.38*  CALCIUM 8.0*  --  7.7* 7.8*  --  7.2*   GFR: Estimated Creatinine Clearance: 77.2 mL/min (A) (by C-G formula based on SCr of 0.38 mg/dL (L)). Liver Function Tests: Recent Labs  Lab 01/07/19 1014  AST 18  ALT 11  ALKPHOS 227*  BILITOT 0.4  PROT 5.9*  ALBUMIN 2.6*   No results for input(s): LIPASE, AMYLASE in the last 168 hours. No results for input(s): AMMONIA in the last 168 hours. Coagulation Profile: No results for input(s): INR, PROTIME in the last 168 hours. Cardiac Enzymes: No results for input(s): CKTOTAL, CKMB, CKMBINDEX, TROPONINI in the last 168 hours. BNP (last 3 results) No results for input(s): PROBNP in the last 8760 hours. HbA1C: No results for input(s): HGBA1C in the last 72 hours. CBG: Recent Labs  Lab 01/10/19 0747 01/10/19 1558  GLUCAP 98 87   Lipid Profile: Recent Labs    01/10/19 0500  TRIG 120   Thyroid  Function Tests: No results for input(s): TSH, T4TOTAL, FREET4, T3FREE, THYROIDAB in the last 72 hours. Anemia Panel: No results for input(s): VITAMINB12, FOLATE, FERRITIN, TIBC, IRON, RETICCTPCT in the last 72 hours. Sepsis Labs: No results for input(s): PROCALCITON, LATICACIDVEN in the last 168 hours.  Recent Results (from the past 240 hour(s))  Surgical pcr screen     Status: Abnormal   Collection Time: 01/09/19  1:49 PM   Specimen: Nasal Mucosa; Nasal Swab  Result Value Ref Range Status   MRSA, PCR NEGATIVE NEGATIVE Final   Staphylococcus aureus POSITIVE (A) NEGATIVE Final    Comment: (NOTE) The Xpert SA Assay (FDA approved for NASAL specimens in patients 74 years of age and older), is one component of a comprehensive surveillance program. It is not intended to diagnose infection nor to guide or monitor treatment. Performed at [facility] [place] [address], [place], [place] [address]          Radiology Studies: Dg Chest Port 1 View  Result Date: 01/10/2019 CLINICAL DATA:  Intubation and OG tube placement. EXAM: PORTABLE CHEST 1 VIEW COMPARISON:  Radiograph yesterday. FINDINGS: Endotracheal tube tip 2.4 cm from the carina. Enteric tube tip below the diaphragm not included in the field of view. Tip of the right upper extremity PICC in the upper SVC. Low lung volumes persist with slight decrease patchy opacities from prior exam. Persistent blunting of both costophrenic angles. Unchanged heart size and mediastinal contours. Bony metastatic disease again seen. Recent left humeral fixation is partially included. IMPRESSION: 1. Endotracheal tube tip 2.4 cm from the carina. Enteric tube tip below the diaphragm not included in the field of view. 2. Persistent low lung volumes. Slight improvement in bilateral patchy opacities from prior exam. Electronically Signed   By: Keith Rake M.D.   On: 01/10/2019 01:06   Dg Chest Port 1 View  Result Date: 01/09/2019 CLINICAL  DATA:  Shortness of breath. EXAM: PORTABLE CHEST 1 VIEW COMPARISON:  Radiograph 01/05/2019, chest CT 12/25/2018 FINDINGS: Very low lung volumes limit assessment, lower lung volumes from prior exam. Right upper extremity PICC tip in the SVC. Cardiomediastinal contours are grossly stable, not well visualized due to low lung volumes. Progression in bilateral lung opacities, right perihilar and left lung base. Blunting of the costophrenic angles likely effusions. Osseous metastatic disease again seen. IMPRESSION: 1. Very low lung volumes, limited assessment lower lung volumes from prior exam. 2. Progression in bilateral lung opacities, right perihilar and left lung base, may be combination of pulmonary edema and pneumonia. Suspected pleural effusions. Electronically Signed  By: Keith Rake M.D.   On: 01/09/2019 22:14   Dg Shoulder Left Port  Result Date: 01/09/2019 CLINICAL DATA:  Postop EXAM: LEFT SHOULDER - 1 VIEW COMPARISON:  01/05/2019 FINDINGS: Interval surgical plate and multiple screw fixation of the left humerus for pathologic proximal humerus fracture. Near anatomic alignment. No humeral head dislocation. Numerous lytic and sclerotic lesions consistent with metastatic disease. Amorphous opacities the proximal humerus at the level of the fracture, presumed to represent surgical packing material. IMPRESSION: Interval surgical plate and multiple screw fixation of proximal left humerus fracture with expected operative changes. Numerous lytic and sclerotic osseous lesions, consistent with skeletal metastatic disease Electronically Signed   By: Donavan Foil M.D.   On: 01/09/2019 21:13   Dg Abd Portable 1v  Result Date: 01/10/2019 CLINICAL DATA:  OG tube placement. EXAM: PORTABLE ABDOMEN - 1 VIEW COMPARISON:  None. FINDINGS: Tip and side port of the enteric tube below the diaphragm in the stomach. Relative paucity of upper abdominal bowel gas. No evidence of free air or obstruction. IMPRESSION: Tip and  side port of the enteric tube below the diaphragm in the stomach. Electronically Signed   By: Keith Rake M.D.   On: 01/10/2019 01:07        Scheduled Meds: . anastrozole  1 mg Oral Daily  . calcium-vitamin D  1 tablet Oral TID  . carvedilol  6.25 mg Oral BID WC  . chlorhexidine  15 mL Mouth Rinse BID  . docusate sodium  100 mg Oral BID  . enoxaparin (LOVENOX) injection  40 mg Subcutaneous Q24H  . feeding supplement (ENSURE ENLIVE)  237 mL Oral BID BM  . furosemide  20 mg Intravenous BID  . loratadine  10 mg Oral Daily  . mouth rinse  15 mL Mouth Rinse q12n4p  . multivitamin with minerals  1 tablet Oral Daily  . nystatin   Topical TID  . pantoprazole (PROTONIX) IV  40 mg Intravenous Daily  . potassium chloride  20 mEq Oral Daily  . sodium chloride flush  3 mL Intravenous Q12H   Continuous Infusions: . sodium chloride 20 mL/hr at 01/10/19 1727  .  ceFAZolin (ANCEF) IV 2 g (01/11/19 0525)     LOS: 17 days    Time spent: 35 minutes.     Elmarie Shiley, MD [facility] Pager [phone]  If 7PM-7AM, please contact night-coverage [email or web address] Password [facility] 01/11/2019, 7:14 AM

## 2019-01-11 NOTE — Progress Notes (Signed)
NAME:  Suzanne [name], MRN:  [id number], DOB:  1961/03/30, LOS: 62 ADMISSION DATE:  12/25/2018, CONSULTATION DATE:  8/14 REFERRING MD:  Tyrell [facility]), CHIEF COMPLAINT:  Vent management    Brief History   58yo female with hx Stage IV breast cancer with known mets to bone and lung with malignant effusion undergoing current oral chemo (just dx June 2020).  She was initially admitted 7/30 with AMS and dyspnea.  She was admitted by hospitalist with concern for sepsis r/t sacral decub. Blood cultures this admit revealed MSSA bacteremia and she received 2 weeks of ancef compelted on 8/14.  She was found to have pathologic L humeral fx and underwent surgical repair ORIF 8/14 after abx course was complete. She was extubated post op but developed respiratory distress with tachypnea, hypoxia, respiratory acidosis, ?pulmonary edema and was reintubated in PACU.  PCCM called for vent management.    Significant Hospital Events   8/14 ORIF L humerus fx , Reintubated in PACU  Consults:  Ortho  ID  Heme/onc   Procedures: L humeral fx repair  ETT 8/14 >> 8/15  Significant Diagnostic Tests:   CT angio chest 7/30 >> Small bilateral pleural effusions. Effusion on the left has decreased since the prior CT. Bandlike opacities in both lungs may be due to scar or fibrosis,unchanged. Diffuse osseous metastatic disease and multiple pathologic fractures as seen prior   6/20 [place] thora LT >> 400 cc 6/3 [place] thora LT >> 350 cc 6/2 [place] thora RT >> 600cc , malignant    Micro Data:  BC x 2 7/31>>> neg  BC x 2 7/30>>> MSSA   Antimicrobials:  Ancef 7/30>>>8/14  Interim history/subjective:    Extubated yesterday Used BiPAP for a few hours and then tearful and BiPAP was discontinued, used overnight for a few hours   Objective   Blood pressure (!) 131/52, pulse (!) 105, temperature (!) 97.5 F (36.4 C), temperature source Oral, resp. rate (!) 23, height 5\' 3"  (1.6 m), weight 82.9 kg, last menstrual period  05/28/2013, SpO2 100 %.    Vent Mode: BIPAP;PCV FiO2 (%):  [30 %-40 %] 30 % Set Rate:  [12 bmp] 12 bmp PEEP:  [5 cmH20] 5 cmH20 Pressure Support:  [5 cmH20] 5 cmH20   Intake/Output Summary (Last 24 hours) at 01/11/2019 0953 Last data filed at 01/11/2019 0555 Gross per 24 hour  Intake 459.75 ml  Output 1500 ml  Net -1040.25 ml   Filed Weights   01/08/19 0441 01/09/19 0603 01/11/19 0607  Weight: 84.6 kg 81.2 kg 82.9 kg    Examination: General: Well-developed female in no distress, lying supine HENT: Mild pallor, no icterus Lungs: dereased breath sounds bilateral no crackles no rhonchi, shallow breathing mild accessory muscle use Cardiovascular: Regular rate and rhythm no murmurs gallops Abdomen: Soft and nontender Extremities: Left arm in a sling Neuro: Follows commands and able to move all 4 extremities GU: Good urine  Chest x-ray 8/15 personally reviewed, bibasal atelectasis and effusions, elevated right hemidiaphragm  Resolved Hospital Problem list   MSSA bacteremia  TTE without signs of endocarditis , blood cultures negative 2 weeks ancef completed 8/14   Assessment & Plan:  Acute on chronic hypoxic respiratory failure -failed extubation post op ortho surgery  Malignant pleural effusion due to  Metastatic breast cancer +/- pulmonary edema with underlying chronic diastolic heart failure.  Elevated right hemidiaphragm  PLAN -  Use BiPAP for 4 hours daytime and overnight Diuresis No current indication for thoracentesis  Stage IV metastatic breast ca  PLAN  Per heme/onc and primary  Continue chemo    Pathologic L humerus fx - s/p surgical repair 8/14 PLAN -  Per ortho   Resumed xanax per anxiety Other issues per [facility]  Summary - Shallow breathing due to elevated right hemidiaphragm and effusions, may be best to gradually wean off BiPAP over next 2-3 days    Kara Mead MD. [facility]. [place] [facility] Pager [phone] If no response call [phone]  [phone]   01/11/2019

## 2019-01-11 NOTE — Progress Notes (Signed)
Called to pt's room by RN with concerns of the pt continuously coughing. Upon arrival, pt is coughing every few seconds and unable to get anything up. Vitals at this time. HR: 108, BP: 124/65, SPO2 96 on 2.5L [place]. No nausea at this time. LL is clear/diminished. RUL is rhonchus. Suctioned patient as she coughed to help clear her airway. Updated speech therapy on current status.

## 2019-01-11 NOTE — Progress Notes (Signed)
Orthopedics Progress Note  Subjective: Patient remains critically ill in ICU with respiratory failure due to malignant effusions secondary to metastatic breast CA. She does tell me that her left arm feels better following ORIF   Objective:  Vitals:   01/11/19 0800 01/11/19 0835  BP: (!) 131/52   Pulse: (!) 105   Resp: (!) 23   Temp:  (!) 97.5 F (36.4 C)  SpO2: 100%     General: Awake and alert  Musculoskeletal: Left shoulder incision CDI, dressing intact. Able to wiggle fingers and make a fist. Sensation intact Neurovascularly intact  Lab Results  Component Value Date   WBC 9.7 01/11/2019   HGB 8.9 (L) 01/11/2019   HCT 29.7 (L) 01/11/2019   MCV 106.8 (H) 01/11/2019   PLT 345 01/11/2019       Component Value Date/Time   NA 139 01/11/2019 0530   K 2.9 (L) 01/11/2019 0530   CL 101 01/11/2019 0530   CO2 29 01/11/2019 0530   GLUCOSE 95 01/11/2019 0530   BUN 10 01/11/2019 0530   CREATININE 0.38 (L) 01/11/2019 0530   CREATININE 0.63 12/22/2018 1219   CALCIUM 6.1 (LL) 01/11/2019 0530   GFRNONAA >60 01/11/2019 0530   GFRNONAA >60 12/22/2018 1219   GFRAA >60 01/11/2019 0530   GFRAA >60 12/22/2018 1219    Lab Results  Component Value Date   INR 1.2 11/15/2018   INR 1.1 10/27/2018    Assessment/Plan: POD #2 s/p Procedure(s): OPEN REDUCTION INTERNAL FIXATION (ORIF) HUMERAL FRACTURE, PATHOLOGIC Continue supportive care OT as tolerated for AROM of the elbow and wrist and ADLs, NWB on left UE  Remo Lipps R. Veverly Fells, MD 01/11/2019 10:05 AM

## 2019-01-11 NOTE — Evaluation (Addendum)
Clinical/Bedside Swallow Evaluation Patient Details  Name: Suzanne [name] MRN: [id number] Date of Birth: 1960-09-21  Today's Date: 01/11/2019 Time: SLP Start Time (ACUTE ONLY): 9983 SLP Stop Time (ACUTE ONLY): 1459 SLP Time Calculation (min) (ACUTE ONLY): 14 min  Past Medical History:  Past Medical History:  Diagnosis Date  . Cancer ([place])   . Family history of breast cancer   . Family history of colon cancer   . Family history of melanoma   . Family history of prostate cancer   . Gallstones   . Glaucoma   . Mastitis    reports history of recurrent mastitis  . Metastatic breast cancer ([place])   . Obesity    Past Surgical History:  Past Surgical History:  Procedure Laterality Date  . GLAUCOMA SURGERY  late 1990's  . IR THORACENTESIS ASP PLEURAL SPACE W/IMG GUIDE  10/28/2018  . IR THORACENTESIS ASP PLEURAL SPACE W/IMG GUIDE  10/29/2018   HPI:  Pt admitted with acute on chronic respiratory failure and hx of breast CA with mets to bone (currently with L UE in sling 2* pathologic fx) and lungs, chronic diastolic heart failure, anemia and pleural effusion. Pt now s/p LUE ORIF 8/14. She was extubated post op but developed respiratory distress with tachypnea, hypoxia, respiratory acidosis, ?pulmonary edema and was reintubated in PACU. Extubated 8/16. Per PCCM, has had shallow breathing due to elevated right hemidiaphragm and effusions; gradually weaning off BiPAP.   Assessment / Plan / Recommendation Clinical Impression  Pt participated in limited clinical swallow assessment.   RT reports coughing episode after liquid meds today.   Oral mechanism exam unremarkable.  Pt with adequate oral mastication/control with no overt difficulty with ice chips.  However, two trials of 5 ml water from spoon elicited immediate coughing and concern for aspiration- swallow eval was discontinued.  RR ranged from low to high 30s during assessment - suspect tachypnea is preventing necessary duration of laryngeal  closure to protect airway.  Rec continuing NPO.  D/W RN, Otho Ket.  SLP will follow for PO readiness.  SLP Visit Diagnosis: Dysphagia, unspecified (R13.10)    Aspiration Risk  Moderate aspiration risk    Diet Recommendation   NPO       Other  Recommendations Oral Care Recommendations: Oral care QID   Follow up Recommendations Other (comment)(tba)      Frequency and Duration min 2x/week  2 weeks       Prognosis Prognosis for Safe Diet Advancement: Fair      Swallow Study   General Date of Onset: 12/25/18 HPI: Pt admitted with acute on chronic respiratory failure and hx of breast CA with mets to bone (currently with L UE in sling 2* pathologic fx) and lungs, chronic diastolic heart failure, anemia and pleural effusion. Pt now s/p LUE ORIF 8/14. She was extubated post op but developed respiratory distress with tachypnea, hypoxia, respiratory acidosis, ?pulmonary edema and was reintubated in PACU. Extubated 8/16. Per PCCM, has had shallow breathing due to elevated right hemidiaphragm and effusions; gradually weaning off BiPAP. Type of Study: Bedside Swallow Evaluation Previous Swallow Assessment: no Diet Prior to this Study: NPO Temperature Spikes Noted: No Respiratory Status: Nasal cannula(5l) History of Recent Intubation: Yes Length of Intubations (days): 2 days Date extubated: 01/10/19 Behavior/Cognition: Alert Oral Cavity Assessment: Within Functional Limits Oral Care Completed by SLP: No Vision: Functional for self-feeding Self-Feeding Abilities: Able to feed self;Needs assist Patient Positioning: Upright in bed Baseline Vocal Quality: Normal Volitional Cough: Strong Volitional Swallow: Able to  elicit    Oral/Motor/Sensory Function Overall Oral Motor/Sensory Function: Within functional limits   Ice Chips Ice chips: Within functional limits Presentation: Spoon   Thin Liquid Thin Liquid: Impaired Presentation: Spoon Pharyngeal  Phase Impairments: Cough - Immediate     Nectar Thick Nectar Thick Liquid: Not tested   Honey Thick Honey Thick Liquid: Not tested   Puree Puree: Not tested   Solid     Solid: Not tested     Estill Bamberg L. Tivis Ringer, [place] Office number [phone] Pager [phone]  Juan Quam Laurice 01/11/2019,3:02 PM

## 2019-01-12 ENCOUNTER — Encounter (HOSPITAL_COMMUNITY): Payer: Self-pay | Admitting: Orthopedic Surgery

## 2019-01-12 DIAGNOSIS — Z01818 Encounter for other preprocedural examination: Secondary | ICD-10-CM

## 2019-01-12 DIAGNOSIS — J9621 Acute and chronic respiratory failure with hypoxia: Secondary | ICD-10-CM

## 2019-01-12 LAB — CBC
HCT: 29.7 % — ABNORMAL LOW (ref 36.0–46.0)
Hemoglobin: 8.8 g/dL — ABNORMAL LOW (ref 12.0–15.0)
MCH: 31.5 pg (ref 26.0–34.0)
MCHC: 29.6 g/dL — ABNORMAL LOW (ref 30.0–36.0)
MCV: 106.5 fL — ABNORMAL HIGH (ref 80.0–100.0)
Platelets: 314 10*3/uL (ref 150–400)
RBC: 2.79 MIL/uL — ABNORMAL LOW (ref 3.87–5.11)
RDW: 24.4 % — ABNORMAL HIGH (ref 11.5–15.5)
WBC: 8.5 10*3/uL (ref 4.0–10.5)
nRBC: 0.7 % — ABNORMAL HIGH (ref 0.0–0.2)

## 2019-01-12 LAB — BASIC METABOLIC PANEL
Anion gap: 11 (ref 5–15)
BUN: 8 mg/dL (ref 6–20)
CO2: 28 mmol/L (ref 22–32)
Calcium: 6.3 mg/dL — CL (ref 8.9–10.3)
Chloride: 100 mmol/L (ref 98–111)
Creatinine, Ser: 0.36 mg/dL — ABNORMAL LOW (ref 0.44–1.00)
GFR calc Af Amer: >60 mL/min (ref >60)
GFR calc non Af Amer: >60 mL/min (ref >60)
Glucose, Bld: 91 mg/dL (ref 70–99)
Potassium: 3.9 mmol/L (ref 3.5–5.1)
Sodium: 139 mmol/L (ref 135–145)

## 2019-01-12 MED ORDER — POTASSIUM CHLORIDE CRYS ER 20 MEQ PO TBCR
40.0000 meq | EXTENDED_RELEASE_TABLET | Freq: Once | ORAL | Status: AC
  Administered 2019-01-12: 40 meq via ORAL
  Filled 2019-01-12: qty 2

## 2019-01-12 MED ORDER — SODIUM CHLORIDE 0.9 % IV SOLN
INTRAVENOUS | Status: DC | PRN
  Administered 2019-01-12 – 2019-01-13 (×2): 250 mL via INTRAVENOUS

## 2019-01-12 MED ORDER — CALCIUM GLUCONATE-NACL 2-0.675 GM/100ML-% IV SOLN
2.0000 g | Freq: Once | INTRAVENOUS | Status: AC
  Administered 2019-01-12: 2000 mg via INTRAVENOUS
  Filled 2019-01-12: qty 100

## 2019-01-12 MED ORDER — PALBOCICLIB 100 MG PO TABS: 100.0000 mg | ORAL_TABLET | Freq: Every day | ORAL | Status: DC

## 2019-01-12 MED ORDER — LORAZEPAM 2 MG/ML IJ SOLN
1.0000 mg | Freq: Once | INTRAMUSCULAR | Status: DC
  Filled 2019-01-12: qty 1

## 2019-01-12 MED ORDER — ALPRAZOLAM 0.25 MG PO TABS
0.2500 mg | ORAL_TABLET | Freq: Three times a day (TID) | ORAL | Status: DC | PRN
  Administered 2019-01-13 – 2019-01-23 (×12): 0.25 mg via ORAL
  Filled 2019-01-12 (×12): qty 1

## 2019-01-12 MED ORDER — NON FORMULARY: 100.0000 mg | Freq: Every day | Status: DC

## 2019-01-12 MED ORDER — CARVEDILOL 6.25 MG PO TABS
6.2500 mg | ORAL_TABLET | Freq: Two times a day (BID) | ORAL | Status: DC
  Administered 2019-01-12 – 2019-01-17 (×11): 6.25 mg via ORAL
  Filled 2019-01-12 (×11): qty 1

## 2019-01-12 MED ORDER — BOOST / RESOURCE BREEZE PO LIQD CUSTOM
1.0000 | Freq: Three times a day (TID) | ORAL | Status: DC
  Administered 2019-01-12 – 2019-01-21 (×10): 1 via ORAL

## 2019-01-12 MED ORDER — LORAZEPAM 2 MG/ML IJ SOLN
0.5000 mg | INTRAMUSCULAR | Status: DC | PRN
  Administered 2019-01-12: 0.5 mg via INTRAVENOUS
  Filled 2019-01-12: qty 1

## 2019-01-12 NOTE — Progress Notes (Signed)
Physical Therapy Treatment Patient Details Name: Suzanne [name] MRN: [id number] DOB: 08-11-1960 Today's Date: 01/12/2019    History of Present Illness Pt admitted with acute on chronic respiratory failure and hx of breast CA with mets to bone (currently with L UE in sling 2* pathologic fx) and lungs, chronic diastolic heart failure, anemia and pleural effusion. Pt now s/p LUE ORIF 8/14 and off vent 8/16    PT Comments    Pt was agreeable to working with PT. She is weak and fatigues easily with activity. Remained on [place] O2 during session. Will continue to follow and progress activity as tolerated.    Follow Up Recommendations  SNF     Equipment Recommendations  None recommended by PT    Recommendations for Other Services       Precautions / Restrictions Precautions Precautions: Fall;Shoulder Type of Shoulder Precautions: Elbow, wrist, hand ROM ok; Pendulums and scap retraction ok; No AROM/PROM to shoulder Shoulder Interventions: Shoulder sling/immobilizer Required Braces or Orthoses: Sling Restrictions Weight Bearing Restrictions: Yes LUE Weight Bearing: Non weight bearing    Mobility  Bed Mobility Overal bed mobility: Needs Assistance Bed Mobility: Supine to Sit;Sit to Supine     Supine to sit: Mod assist;HOB elevated Sit to supine: Mod assist;HOB elevated   General bed mobility comments: mod A for trunk and  bil LEs. Pt needed assist to scoot hips with bed pad. Increased time.  Transfers Overall transfer level: Needs assistance Equipment used: Quad cane Transfers: Sit to/from Stand Sit to Stand: Mod assist         General transfer comment: Assist to power up, steady, control descent. VCs safety, technique. Increased time.  Ambulation/Gait Ambulation/Gait assistance: Min assist;+2 safety/equipment Gait Distance (Feet): 10 Feet Assistive device: Quad cane Gait Pattern/deviations: Step-to pattern;Trunk flexed;Decreased step length - left;Decreased step length -  right     General Gait Details: Assist to steady pt throughout short distance. Remained on [place] O2 sats >90%. Pt fatigues easily. Dyspnea 3/4.   Stairs             Wheelchair Mobility    Modified Rankin (Stroke Patients Only)       Balance Overall balance assessment: Needs assistance         Standing balance support: Single extremity supported Standing balance-Leahy Scale: Poor                              Cognition Arousal/Alertness: Awake/alert Behavior During Therapy: WFL for tasks assessed/performed Overall Cognitive Status: Within Functional Limits for tasks assessed                                        Exercises      General Comments        Pertinent Vitals/Pain Pain Assessment: Faces Faces Pain Scale: No hurt Pain Intervention(s): Limited activity within patient's tolerance;Monitored during session;Repositioned    Home Living                      Prior Function            PT Goals (current goals can now be found in the care plan section) Progress towards PT goals: Progressing toward goals    Frequency    Min 3X/week      PT Plan Current plan remains appropriate    Co-evaluation  AM-PAC PT "6 Clicks" Mobility   Outcome Measure  Help needed turning from your back to your side while in a flat bed without using bedrails?: A Lot Help needed moving from lying on your back to sitting on the side of a flat bed without using bedrails?: A Lot Help needed moving to and from a bed to a chair (including a wheelchair)?: A Little Help needed standing up from a chair using your arms (e.g., wheelchair or bedside chair)?: A Lot Help needed to walk in hospital room?: A Lot Help needed climbing 3-5 steps with a railing? : Total 6 Click Score: 12    End of Session Equipment Utilized During Treatment: Gait belt;Oxygen Activity Tolerance: Patient limited by fatigue Patient left: in bed;with  call bell/phone within reach;with bed alarm set   PT Visit Diagnosis: Difficulty in walking, not elsewhere classified (R26.2);Unsteadiness on feet (R26.81)     Time: 6812-7517 PT Time Calculation (min) (ACUTE ONLY): 22 min  Charges:  $Gait Training: 8-22 mins                       Weston Anna, PT Acute Rehabilitation Services Pager: [phone] Office: [phone]

## 2019-01-12 NOTE — Progress Notes (Signed)
   Subjective: 3 Days Post-Op Procedure(s) (LRB): OPEN REDUCTION INTERNAL FIXATION (ORIF) HUMERAL FRACTURE (Left)  Pt states left arm is feeling better Working with gentle exercises  Denies any new symptoms with the left arm Patient reports pain as mild.  Objective:   VITALS:   Vitals:   01/12/19 1000 01/12/19 1100  BP: 133/85 (!) 139/37  Pulse: (!) 115 (!) 110  Resp: (!) 22 (!) 24  Temp:    SpO2: 100% 99%    Left shoulder incision healing well nv intact distally No rashes or drainage Good early one piece rom  LABS Recent Labs    01/10/19 0500 01/11/19 0530 01/12/19 0415  HGB 9.0* 8.9* 8.8*  HCT 29.2* 29.7* 29.7*  WBC 9.6 9.7 8.5  PLT 341 345 314    Recent Labs    01/10/19 0500 01/11/19 0530 01/12/19 0415  NA 139 139 139  K 3.8 2.9* 3.9  BUN 10 10 8   CREATININE 0.38* 0.38* 0.36*  GLUCOSE 135* 95 91     Assessment/Plan: 3 Days Post-Op Procedure(s) (LRB): OPEN REDUCTION INTERNAL FIXATION (ORIF) HUMERAL FRACTURE (Left) Continue PT/OT, gentle exercises as able Will continue to monitor her progress    Merla Riches PA-C, MPAS [place] is now [employer] [facility] [address], [place], [place], [place] [address] Phone: [phone] [email or web address] Facebook  [employer]

## 2019-01-12 NOTE — Progress Notes (Signed)
Nutrition Follow-up  DOCUMENTATION CODES:   Obesity unspecified  INTERVENTION:   -Boost Breeze po TID, each supplement provides 250 kcal and 9 grams of protein while on clear liquids -Resume Ensure Enlive po BID, each supplement provides 350 kcal and 20 grams of protein once diet is advanced -Continue Multivitamin with minerals daily  NUTRITION DIAGNOSIS:   Increased nutrient needs related to chronic illness, cancer and cancer related treatments as evidenced by estimated needs.  Ongoing.  GOAL:   Patient will meet greater than or equal to 90% of their needs  Not meeting on clears.  MONITOR:   PO intake, Supplement acceptance, Labs, Weight trends, I & O's  ASSESSMENT:   58 y.o. female with past medical history significant for stage 4 breast cancer with metastasis to bone, lung, and lymph node, chronic hypoxic respiratory failure on 2L of O2, CHF, malignant pleural effusion diagnosed in 10/2018. She presented to the ED complaining of worsening shortness of breath that started the morning of admission.  7/30: admitted 8/14: s/p Left humerus fracture open reduction internal fixation, re-intubated in PACU 8/15: extubated  **RD working remotely**  Patient now on clear liquids per SLP recommendations following evaluation. SLP suspects pt will be able to advance diet quickly. Will order Boost Breeze supplements while on clears then can resume Ensure supplements.   Per weight records, weight has decreased by 10 lbs since admission.  Admission weight: 192 lbs 7/31 Weight as of 8/16: 182 lbs. I/Os: -3.1L since 8/3  Labs reviewed. Medications: IV Lasix BID, Multivitamin with minerals daily, K-DUR tablet once  Diet Order:   Diet Order            Diet clear liquid Room service appropriate? Yes; Fluid consistency: Thin  Diet effective now              EDUCATION NEEDS:   Not appropriate for education at this time  Skin:  Skin Assessment: Skin Integrity Issues: Skin  Integrity Issues:: DTI DTI: sacrum  Last BM:  8/13  Height:   Ht Readings from Last 1 Encounters:  12/27/18 5\' 3"  (1.6 m)    Weight:   Wt Readings from Last 1 Encounters:  01/11/19 82.9 kg    Ideal Body Weight:  52.3 kg  BMI:  Body mass index is 32.37 kg/m.  Estimated Nutritional Needs:   Kcal:  3254-9826 kcal  Protein:  130-140 grams  Fluid:  >/= 1.5 L/day  Clayton Bibles, MS, RD, LDN [place] [facility] Pager: [phone] After Hours Pager: [phone]

## 2019-01-12 NOTE — Progress Notes (Signed)
PROGRESS NOTE    Suzanne [name]  NTI:[id number] DOB: 12/01/1960 DOA: 12/25/2018 PCP: Patient, No Pcp Per   Brief Narrative: 58 year old with past medical history significant for stage IV breast cancer with known metastasis to bone, lung and lymph nodes and malignant pleural effusion admitted on 12/25/2018 with worsening shortness of breath and altered mental status.  Of note patient was diagnosed with metastatic breast cancer in early June 2020.  At the time of diagnosis she was found to have widespread metastatic disease and pathologic fracture in her right right clavicle.  She subsequently underwent neoadjuvant chemotherapy but has also had her treatment complicated by recurrent malignant right-sided pleural effusion, neutropenia and thrombocytopenia.  In the emergency room patient was noted to be septic, likely secondary to sacral decubital ulcer, blood cultures on admission grew MSSA.  ID was consulted and recommendation was for 4 weeks of Ancef, which finish on 8/14.  Patient was also found to have a pathologic left humeral fracture.  Examination by orthopedic surgery recommended surgery this admission.    Assessment & Plan:   Principal Problem:   Acute on chronic respiratory failure with hypoxia (HCC) Active Problems:   Bone metastasis (HCC)   Pleural effusion, bilateral   Carcinoma of breast, estrogen receptor positive, stage 4, right (HCC)   Pressure injury of skin   Pneumonia   Respiratory failure, acute (HCC)   Fracture closed, humerus, shaft   Acute on chronic hypoxic respiratory failure: Patient presented with worsening hypoxia.  Chest x-ray did not show any acute finding.  She has a known right malignant pleural effusion.  Last thoracentesis was 6/20.  Per chart review, there was some concern of possible lymphangitic spread of tumor in the lungs. Patient has been stable on 2 L of oxygen.  She was initially on IV Solu-Medrol which has been discontinued. Patient had to be  re-intubated post surgery. She became hypoxic and respiratory distress.  She was extubated this morning 8-15, place on BIPAP , appreciate CCM assistance.  Diuresis. Lasix 20 IV BID. CCM recommending BIPAP at HS and PRN during day.  Continue with incentive spirometry, out of bed.  Continue with diuresis.   MSSA bacteremia/sepsis; -Sepsis present on admission.  Likely source a stage II/III sacral decubitus ulcer. -ID consultation was obtained.. -Sacral wound did not require any surgical intervention.. -TTE did not show any signs of endocarditis.. -Repeated blood cultures on 7/30 first did not show any growth -Per ID recommendation, patient is currently on 4 weeks course of Ancef until 8/27   2-Stage IV metastatic breast cancer to bone and lung: -Oncology follow up appreciated.. -Patient to continue oral chemotherapy with anastrozole, palbociclib and denosumab/xgeva.   3-Pathological left femur fractures: Noted on x-ray obtained on 7/25 as an outpatient for shoulder pain. Seen by radiation oncology.  Apparently patient had an appointment with orthopedic on 8/6 which she missed because she was in the hospital.  Seen by Dr. Veverly Fells with orthopedic who recommended surgery this admission. Dr. Maryland Pink discussed case with Dr. Jana Hakim and orthopedic doctor and decision was made to proceed with surgery this admission. S/P left Humerus fracture open reduction and internal fixation using Bionmet ALPS plate    Hypokalemia replaced. Continue with oral supplement while patient is on lasix.  Hypocalcemia;  Replete IV.   Anemia of chronic disease, likely related to malignancy. She has received blood transfusion as an outpatient. Currently hemoglobin has been stable around 8. Hb stable.   Elevated d-dimer: CT angios chest was negative for PE,  Dopplers of lower extremity negative for DVT.  Elevated alkaline phosphatase: Secondary to metastatic bone disease. Chronic diastolic heart failure:  Appears compensated.  Continue with Coreg Resume lasix.   Impaired mobility: Patient will likely require rehab admission  Stage II-3 decubitus ulcer sacrum present on admission Local care.  Pressure Injury 12/25/18 Sacrum Mid Deep Tissue Injury - Purple or maroon localized area of discolored intact skin or blood-filled blister due to damage of underlying soft tissue from pressure and/or shear. evolving into stage 2 in patchy areas when asse (Active)  12/25/18 1700  Location: Sacrum  Location Orientation: Mid  Staging: Deep Tissue Injury - Purple or maroon localized area of discolored intact skin or blood-filled blister due to damage of underlying soft tissue from pressure and/or shear.  Wound Description (Comments): evolving into stage 2 in patchy areas when assessed on 8/2  Present on Admission: Yes     Nutrition Problem: Increased nutrient needs Etiology: chronic illness, cancer and cancer related treatments    Signs/Symptoms: estimated needs    Interventions: Ensure Enlive (each supplement provides 350kcal and 20 grams of protein), MVI  Estimated body mass index is 32.37 kg/m as calculated from the following:   Height as of this encounter: 5\' 3"  (1.6 m).   Weight as of this encounter: 82.9 kg.   DVT prophylaxis: Lovenox Code Status: Full code Family Communication: Discussed with patient Disposition Plan: needs PT, SNF Consultants:   Orthopedics  Oncology  Infectious disease  Procedures:   None  Antimicrobials:  IV Ancef from 7/31---8/14  Subjective: She is off BIPAP. Breathing better.   Objective: Vitals:   01/12/19 0900 01/12/19 1000 01/12/19 1100 01/12/19 1200  BP:  133/85 (!) 139/37 130/70  Pulse: (!) 116 (!) 115 (!) 110 (!) 101  Resp: (!) 26 (!) 22 (!) 24 (!) 23  Temp:    97.9 F (36.6 C)  TempSrc:    Oral  SpO2: 100% 100% 99% 100%  Weight:      Height:        Intake/Output Summary (Last 24 hours) at 01/12/2019 1256 Last data filed at  01/12/2019 1100 Gross per 24 hour  Intake 661.08 ml  Output 2850 ml  Net -2188.92 ml   Filed Weights   01/08/19 0441 01/09/19 0603 01/11/19 0607  Weight: 84.6 kg 81.2 kg 82.9 kg    Examination:  General exam: NAD Respiratory system: Decreased breath sound, Bilateral crackles at bases Cardiovascular system: S 1, S 2 RRR Gastrointestinal system: BS present, soft, nt Central nervous system: Alert.  Extremities: Symmetric power.  Skin: No rashes.   Data Reviewed: I have personally reviewed following labs and imaging studies  CBC: Recent Labs  Lab 01/07/19 1014 01/09/19 0445 01/09/19 1947 01/10/19 0500 01/11/19 0530 01/12/19 0415  WBC 9.3 8.7  --  9.6 9.7 8.5  HGB 8.2* 8.2* 8.8* 9.0* 8.9* 8.8*  HCT 28.1* 28.5* 26.0* 29.2* 29.7* 29.7*  MCV 112.0* 113.1*  --  104.3* 106.8* 106.5*  PLT 376 377  --  341 345 638   Basic Metabolic Panel: Recent Labs  Lab 01/08/19 0802 01/09/19 0445 01/09/19 1947 01/10/19 0500 01/11/19 0530 01/12/19 0415  NA 140 139 137 139 139 139  K 3.3* 3.8 3.7 3.8 2.9* 3.9  CL 102 104  --  102 101 100  CO2 29 30  --  28 29 28   GLUCOSE 176* 105* 101* 135* 95 91  BUN 12 9  --  10 10 8   CREATININE 0.49 0.38*  --  0.38* 0.38* 0.36*  CALCIUM 7.7* 7.8*  --  7.2* 6.1* 6.3*   GFR: Estimated Creatinine Clearance: 77.2 mL/min (A) (by C-G formula based on SCr of 0.36 mg/dL (L)). Liver Function Tests: Recent Labs  Lab 01/07/19 1014 01/11/19 1036  AST 18 15  ALT 11 8  ALKPHOS 227* 213*  BILITOT 0.4 0.6  PROT 5.9* 6.3*  ALBUMIN 2.6* 2.9*   No results for input(s): LIPASE, AMYLASE in the last 168 hours. No results for input(s): AMMONIA in the last 168 hours. Coagulation Profile: No results for input(s): INR, PROTIME in the last 168 hours. Cardiac Enzymes: No results for input(s): CKTOTAL, CKMB, CKMBINDEX, TROPONINI in the last 168 hours. BNP (last 3 results) No results for input(s): PROBNP in the last 8760 hours. HbA1C: No results for input(s):  HGBA1C in the last 72 hours. CBG: Recent Labs  Lab 01/10/19 0747 01/10/19 1558  GLUCAP 98 87   Lipid Profile: Recent Labs    01/10/19 0500  TRIG 120   Thyroid Function Tests: No results for input(s): TSH, T4TOTAL, FREET4, T3FREE, THYROIDAB in the last 72 hours. Anemia Panel: No results for input(s): VITAMINB12, FOLATE, FERRITIN, TIBC, IRON, RETICCTPCT in the last 72 hours. Sepsis Labs: No results for input(s): PROCALCITON, LATICACIDVEN in the last 168 hours.  Recent Results (from the past 240 hour(s))  Surgical pcr screen     Status: Abnormal   Collection Time: 01/09/19  1:49 PM   Specimen: Nasal Mucosa; Nasal Swab  Result Value Ref Range Status   MRSA, PCR NEGATIVE NEGATIVE Final   Staphylococcus aureus POSITIVE (A) NEGATIVE Final    Comment: (NOTE) The Xpert SA Assay (FDA approved for NASAL specimens in patients 31 years of age and older), is one component of a comprehensive surveillance program. It is not intended to diagnose infection nor to guide or monitor treatment. Performed at [facility], [place] [address] [place], [place] [address]          Radiology Studies: No results found.      Scheduled Meds: . anastrozole  1 mg Oral Daily  . chlorhexidine  15 mL Mouth Rinse BID  . Chlorhexidine Gluconate Cloth  6 each Topical Daily  . docusate sodium  100 mg Oral BID  . enoxaparin (LOVENOX) injection  40 mg Subcutaneous Q24H  . feeding supplement (ENSURE ENLIVE)  237 mL Oral BID BM  . furosemide  20 mg Intravenous BID  . mouth rinse  15 mL Mouth Rinse q12n4p  . metoprolol tartrate  2.5 mg Intravenous Q12H  . multivitamin with minerals  1 tablet Oral Daily  . nystatin   Topical TID  . pantoprazole (PROTONIX) IV  40 mg Intravenous Daily  . sodium chloride flush  3 mL Intravenous Q12H   Continuous Infusions: . sodium chloride 20 mL/hr at 01/10/19 1727  .  ceFAZolin (ANCEF) IV Stopped (01/12/19 0539)     LOS: 18 days    Time spent:  35 minutes.     Elmarie Shiley, MD [facility] Pager [phone]  If 7PM-7AM, please contact night-coverage [email or web address] Password [facility] 01/12/2019, 12:56 PM

## 2019-01-12 NOTE — Progress Notes (Signed)
  Speech Language Pathology Treatment: Dysphagia  Patient Details Name: Suzanne [name] MRN: [id number] DOB: 10-27-1960 Today's Date: 01/12/2019 Time: 6195-0932 SLP Time Calculation (min) (ACUTE ONLY): 25 min  Assessment / Plan / Recommendation Clinical Impression  Pt today sitting upright in chair, voice is marginally hoarse- pt assigns value of voice at 5 of 10 being normal and yesterday 3/10.  She has a strong cough and reports she is not coughing up secretions today nor having significant coughing episodes.  In addition, she reports she is having mild pain in her throat on left and above sternal region - She denies need for pain medication at this time.  Intake of cold drinks improved her comfort.    Pt denies h/o reflux or dysphagia.  Observed her with intake of graham cracker, apple sauce, apple juice, water and ice chips.  NO indication of aspiration or residuals apparent and swallow appeared swift.  Pt reports being scared to eat - provided her with encouragement to use caution but not be "afraid".  She did not pass a 3 ounce [place] swallow as she required rest break due to breath hunger.  Using teach back, educated pt to aspiration precautions.  To be conservative starting clear liquid diet recommended and pt desires this plan.  Anticipate she will quickly advance diet.  Will follow up for dysphagia management.    HPI HPI: Pt admitted with acute on chronic respiratory failure and hx of breast CA with mets to bone (currently with L UE in sling 2* pathologic fx) and lungs, chronic diastolic heart failure, anemia and pleural effusion. Pt now s/p LUE ORIF 8/14. She was extubated post op but developed respiratory distress with tachypnea, hypoxia, respiratory acidosis, ?pulmonary edema and was reintubated in PACU. Extubated 8/16. Per PCCM, has had shallow breathing due to elevated right hemidiaphragm and effusions; gradually weaning off BiPAP.      SLP Plan  Continue with current plan of care       Recommendations  Diet recommendations: Clears;Thin liquid Liquids provided via: Cup;Straw Medication Administration: Whole meds with puree Supervision: Patient able to self feed Compensations: Slow rate;Small sips/bites Postural Changes and/or Swallow Maneuvers: Seated upright 90 degrees;Upright 30-60 min after meal                Oral Care Recommendations: Oral care QID Follow up Recommendations: Other (comment)(tba) SLP Visit Diagnosis: Dysphagia, unspecified (R13.10) Plan: Continue with current plan of care       [place], [place] [facility] SLP Acute Rehab Services Pager [phone] Office [phone]   Macario Golds 01/12/2019, 10:21 AM

## 2019-01-12 NOTE — Progress Notes (Signed)
NAME:  Suzanne [name], MRN:  [id number], DOB:  1960-07-08, LOS: 16 ADMISSION DATE:  12/25/2018, CONSULTATION DATE:  8/14 REFERRING MD:  Tyrell Antonio [facility]), CHIEF COMPLAINT:  Vent management    Brief History   58yo female with hx Stage IV breast cancer with known mets to bone and lung with malignant effusion undergoing current oral chemo (just dx June 2020).  She was initially admitted 7/30 with AMS and dyspnea.  She was admitted by hospitalist with concern for sepsis r/t sacral decub. Blood cultures this admit revealed MSSA bacteremia and she received 2 weeks of ancef compelted on 8/14.  She was found to have pathologic L humeral fx and underwent surgical repair ORIF 8/14 after abx course was complete. She was extubated post op but developed respiratory distress with tachypnea, hypoxia, respiratory acidosis, ?pulmonary edema and was reintubated in PACU.  PCCM called for vent management.    Significant Hospital Events   8/14 ORIF L humerus fx , Reintubated in PACU 8/15 extubated 8/17 feeling better   Consults:  Ortho  ID  Heme/onc   Procedures: L humeral fx repair  ETT 8/14 >> 8/15  Significant Diagnostic Tests:   CT angio chest 7/30 >> Small bilateral pleural effusions. Effusion on the left has decreased since the prior CT. Bandlike opacities in both lungs may be due to scar or fibrosis,unchanged. Diffuse osseous metastatic disease and multiple pathologic fractures as seen prior   6/20 [place] thora LT >> 400 cc 6/3 [place] thora LT >> 350 cc 6/2 [place] th[place] RT >> 600cc , malignant    Micro Data:  BC x 2 7/31>>> neg  BC x 2 7/30>>> MSSA   Antimicrobials:  Ancef 7/30>>>8/14  Interim history/subjective:   feels better    Objective   Blood pressure 125/66, pulse (Abnormal) 102, temperature 98 F (36.7 C), temperature source Axillary, resp. rate (Abnormal) 22, height 5\' 3"  (1.6 m), weight 82.9 kg, last menstrual period 05/28/2013, SpO2 100 %.    Vent Mode: BIPAP;PCV FiO2 (%):  [30  %] 30 % Set Rate:  [12 bmp] 12 bmp PEEP:  [5 cmH20] 5 cmH20   Intake/Output Summary (Last 24 hours) at 01/12/2019 0845 Last data filed at 01/12/2019 0400 Gross per 24 hour  Intake 561.08 ml  Output 1850 ml  Net -1288.92 ml   Filed Weights   01/08/19 0441 01/09/19 0603 01/11/19 0607  Weight: 84.6 kg 81.2 kg 82.9 kg    Examination: General this is a debilitated 58 year old female who looks much older than stated age 89 normocephalic atraumatic no jugular venous distention Pulmonary: Diminished bilaterally, only requiring about 250 cc on spirometry Cardiac: Regular rate and rhythm Abdomen: Soft nontender Extremities: Trace edema left upper extremity in sling CMS intact Neuro: Awake oriented follows commands GU: Clear yellow  Resolved Hospital Problem list   MSSA bacteremia  TTE without signs of endocarditis , blood cultures negative 2 weeks ancef completed 8/14   Assessment & Plan:   Acute on chronic respiratory failure Pulmonary edema Intermittent pleural effusions w/ malignant right effusion Pulmonary edema Decompensated diastolic HF Atelectasis  Elevated right hemidiaphragm  Stage IV Breast cancer  Pathologic left humerus fx  Recent bacteremia (MSSA)   Acute on chronic hypoxic respiratory failure 2/2 volume overload superimposed on Malignant pleural effusion due to  Metastatic breast cancer and Elevated right hemidiaphragm/ Atelectasis  -Clinically improving still on 4 L but looks like we can taper this down she is typically on 2 L at baseline, last chest x-ray was  on the 15th which showed low volume on the right with elevated right hemidiaphragm Plan Push pulm hygiene  IS hourly  BIPAP at HS and PRN during day  CXR am to eval effusion  Swallow eval    All other issues per medical, ortho and oncology teams  Erick Colace ACNP-BC [place] Pager # [phone] OR # [phone] if no answer    01/12/2019

## 2019-01-12 NOTE — Progress Notes (Signed)
Paitent reoved from NIV and placed on [place]; no distress noted at this time. RT will continue to monitor patient.

## 2019-01-12 NOTE — Progress Notes (Addendum)
Suzanne [name]   DOB:1960-09-17   IO#:[id number]   EQA#:[id number]  Subjective:  Suzanne [name] was transferred to ICU over weekend. She was initially extubated post op, but developed respiratory distress and re intubated. Extubated on 01/10/2019. Feels better this am. Breathing improved. Currently NPO as she is moderate aspiration risk. SLP following. Denies pain to her left shoulder/arm.   Objective: morbidly obese White woman examined in bed Vitals:   01/12/19 0800 01/12/19 0900  BP: 128/68   Pulse: (!) 103 (!) 116  Resp: (!) 24 (!) 26  Temp: 98 F (36.7 C)   SpO2: 100% 100%    Body mass index is 32.37 kg/m.  Intake/Output Summary (Last 24 hours) at 01/12/2019 0945 Last data filed at 01/12/2019 0843 Gross per 24 hour  Intake 661.08 ml  Output 1850 ml  Net -1188.92 ml   General: Awake and alert, no distress HEENT: No thrush or mucositis Lungs: Clear to auscultation bilaterally Cardiovascular: Regular rate and rhythm, no murmur, no lower extremity edema Abdomen: Positive bowel sounds, soft, nontender Neuro: Alert and oriented x3  CBG (last 3)  Recent Labs    01/10/19 0747 01/10/19 1558  GLUCAP 98 87     Labs:  Lab Results  Component Value Date   WBC 8.5 01/12/2019   HGB 8.8 (L) 01/12/2019   HCT 29.7 (L) 01/12/2019   MCV 106.5 (H) 01/12/2019   PLT 314 01/12/2019   NEUTROABS 5.0 12/25/2018    '@LASTCHEMISTRY' @  Urine Studies No results for input(s): UHGB, CRYS in the last 72 hours.  Invalid input(s): UACOL, UAPR, USPG, UPH, UTP, UGL, [place], UBIL, UNIT, UROB, [place], UEPI, UWBC, Duwayne Heck [place], [place]  Basic Metabolic Panel: Recent Labs  Lab 01/08/19 0802 01/09/19 0445 01/09/19 1947 01/10/19 0500 01/11/19 0530 01/12/19 0415  NA 140 139 137 139 139 139  K 3.3* 3.8 3.7 3.8 2.9* 3.9  CL 102 104  --  102 101 100  CO2 29 30  --  '28 29 28  ' GLUCOSE 176* 105* 101* 135* 95 91  BUN 12 9  --  '10 10 8  ' CREATININE 0.49 0.38*  --  0.38* 0.38* 0.36*  CALCIUM 7.7* 7.8*   --  7.2* 6.1* 6.3*   GFR Estimated Creatinine Clearance: 77.2 mL/min (A) (by C-G formula based on SCr of 0.36 mg/dL (L)). Liver Function Tests: Recent Labs  Lab 01/07/19 1014 01/11/19 1036  AST 18 15  ALT 11 8  ALKPHOS 227* 213*  BILITOT 0.4 0.6  PROT 5.9* 6.3*  ALBUMIN 2.6* 2.9*   No results for input(s): LIPASE, AMYLASE in the last 168 hours. No results for input(s): AMMONIA in the last 168 hours. Coagulation profile No results for input(s): INR, PROTIME in the last 168 hours.  CBC: Recent Labs  Lab 01/07/19 1014 01/09/19 0445 01/09/19 1947 01/10/19 0500 01/11/19 0530 01/12/19 0415  WBC 9.3 8.7  --  9.6 9.7 8.5  HGB 8.2* 8.2* 8.8* 9.0* 8.9* 8.8*  HCT 28.1* 28.5* 26.0* 29.2* 29.7* 29.7*  MCV 112.0* 113.1*  --  104.3* 106.8* 106.5*  PLT 376 377  --  341 345 314   Cardiac Enzymes: No results for input(s): CKTOTAL, CKMB, CKMBINDEX, TROPONINI in the last 168 hours. BNP: Invalid input(s): POCBNP CBG: Recent Labs  Lab 01/10/19 0747 01/10/19 1558  GLUCAP 98 87   D-Dimer No results for input(s): DDIMER in the last 72 hours. Hgb A1c No results for input(s): HGBA1C in the last 72 hours. Lipid Profile Recent Labs  01/10/19 0500  TRIG 120   Thyroid function studies No results for input(s): TSH, T4TOTAL, T3FREE, THYROIDAB in the last 72 hours.  Invalid input(s): FREET3 Anemia work up No results for input(s): VITAMINB12, FOLATE, FERRITIN, TIBC, IRON, RETICCTPCT in the last 72 hours. Microbiology Recent Results (from the past 240 hour(s))  Surgical pcr screen     Status: Abnormal   Collection Time: 01/09/19  1:49 PM   Specimen: Nasal Mucosa; Nasal Swab  Result Value Ref Range Status   MRSA, PCR NEGATIVE NEGATIVE Final   Staphylococcus aureus POSITIVE (A) NEGATIVE Final    Comment: (NOTE) The Xpert SA Assay (FDA approved for NASAL specimens in patients 40 years of age and older), is one component of a comprehensive surveillance program. It is not intended  to diagnose infection nor to guide or monitor treatment. Performed at [facility], [place] [address] [place], [place] [address]       Studies:  No results found.  Assessment: 58 y.o. [place] woman presenting 10/26/2018 with right-sided inflammatory breast cancer, stage IV, involving lungs, lymph nodes and bones, as follows:             (a) chest CT scan 10/26/2018 shows bilateral pleural effusions, possible lymphangitic spread of tumor, diffuse bony metastatic disease, and significant axillary mediastinal and hilar adenopathy             (b) bone scan 10/27/2018 is a "near [place]" consistent with widespread bony metastatic disease             (c) head CT with and without contrast 10/30/2018 shows no intracranial metastatic disease, multiple calvarial lesions             (d) CA-27-29 on 10/27/2018 was 1810.4  (1) pleural fluid from right thoracentesis 10/28/2018 confirms malignant cells consistent with a breast primary, strongly estrogen and progesterone receptor positive, HER-2 not amplified, with an MIB-1 of 2%  (2) anastrozole started 10/29/2018             (a) palbociclib to start 11/13/2018             (b) denosumab/xgeva to start 11/13/2018  (3) associated problems:             (a) hypoxia secondary to effusions, deconditioning, morbid obesity             (b) pain from bone lesions             (c) right upper extremity lymphedema             (d) anemia, requiring transfusion             (e) poor venous access  (f) poor social situation  (4) genetics testing pending     Plan:  [name] required vent post-op. Now extubated and breathing improving. Currently NPO due to risk of aspiration. SLP following.   She remains on Ancef for bacteremia which is scheduled to end on 01/22/2019. She has been on treatment well with anastrozole, palbociclib, and Xgeva. We would like to restart her Ibrance Wednesday or Thursday of this week. I spoke with pharmacy.  Per protocol, Ibrance held due to infection. However, they will defer to Medical Oncology about resuming prior to completion of antibiotics. There is not a drug interaction between Ibrance and Ancef. We will also need to make sure that her swallowing improves before we can resume.   Recommend for PT/OT to continue to work with her. She is still awaiting placement at a  skilled nursing facility.  Will follow with you.  Mikey Bussing, NP 01/12/2019  9:45 AM Medical Oncology and Hematology [facility] [address] [place], [place] [address] Tel. [phone]    Fax. [phone]   ADDENDUM: Suzanne [name] is recovering from her orthopedic surgery and she reports considerably less pain in the arm.  Possibly we can start dropping back her narcotics.  That would be helpful in terms of her functional status.  I am concerned that she has not been on palbociclib for some time.  She will be finishing her antibiotics 08 27.  I think we can start the Ibrance a week before that given the fact that we have documented negative cultures so the plan will be to resume palbociclib 01/15/2019.  I will enter those orders.  What she needs most of all now is aggressive physical therapy.  She is motivated.  I greatly appreciate the efforts of the entire team on behalf of this complex patient!  I personally saw this patient and performed a substantive portion of this encounter with the listed APP documented above.   Chauncey Cruel, MD Medical Oncology and Hematology [facility] [address] [place], [place] [address] Tel. [phone]    Fax. [phone]

## 2019-01-13 ENCOUNTER — Inpatient Hospital Stay (HOSPITAL_COMMUNITY): Payer: Medicaid Other

## 2019-01-13 LAB — BASIC METABOLIC PANEL
Anion gap: 8 (ref 5–15)
BUN: 6 mg/dL (ref 6–20)
CO2: 29 mmol/L (ref 22–32)
Calcium: 5.2 mg/dL — CL (ref 8.9–10.3)
Chloride: 100 mmol/L (ref 98–111)
Creatinine, Ser: 0.34 mg/dL — ABNORMAL LOW (ref 0.44–1.00)
GFR calc Af Amer: >60 mL/min (ref >60)
GFR calc non Af Amer: >60 mL/min (ref >60)
Glucose, Bld: 101 mg/dL — ABNORMAL HIGH (ref 70–99)
Potassium: 3.9 mmol/L (ref 3.5–5.1)
Sodium: 137 mmol/L (ref 135–145)

## 2019-01-13 LAB — CBC
HCT: 29.1 % — ABNORMAL LOW (ref 36.0–46.0)
Hemoglobin: 8.9 g/dL — ABNORMAL LOW (ref 12.0–15.0)
MCH: 32.6 pg (ref 26.0–34.0)
MCHC: 30.6 g/dL (ref 30.0–36.0)
MCV: 106.6 fL — ABNORMAL HIGH (ref 80.0–100.0)
Platelets: 326 10*3/uL (ref 150–400)
RBC: 2.73 MIL/uL — ABNORMAL LOW (ref 3.87–5.11)
RDW: 23.5 % — ABNORMAL HIGH (ref 11.5–15.5)
WBC: 8 10*3/uL (ref 4.0–10.5)
nRBC: 0.4 % — ABNORMAL HIGH (ref 0.0–0.2)

## 2019-01-13 MED ORDER — HYDROCOD POLST-CPM POLST ER 10-8 MG/5ML PO SUER
5.0000 mL | Freq: Once | ORAL | Status: AC
  Administered 2019-01-13: 5 mL via ORAL

## 2019-01-13 MED ORDER — CALCIUM GLUCONATE-NACL 2-0.675 GM/100ML-% IV SOLN
2.0000 g | Freq: Once | INTRAVENOUS | Status: AC
  Administered 2019-01-13: 2000 mg via INTRAVENOUS
  Filled 2019-01-13: qty 100

## 2019-01-13 MED ORDER — SENNA 8.6 MG PO TABS
1.0000 | ORAL_TABLET | Freq: Every day | ORAL | Status: DC
  Administered 2019-01-15 – 2019-01-16 (×2): 8.6 mg via ORAL
  Filled 2019-01-13 (×3): qty 1

## 2019-01-13 MED ORDER — FUROSEMIDE 10 MG/ML IJ SOLN
40.0000 mg | Freq: Once | INTRAMUSCULAR | Status: AC
  Administered 2019-01-14: 40 mg via INTRAVENOUS
  Filled 2019-01-13: qty 4

## 2019-01-13 MED ORDER — HYDROCOD POLST-CPM POLST ER 10-8 MG/5ML PO SUER
5.0000 mL | Freq: Once | ORAL | Status: AC
  Administered 2019-01-13: 5 mL via ORAL
  Filled 2019-01-13: qty 5

## 2019-01-13 NOTE — Progress Notes (Addendum)
Suzanne [name]   DOB:01/13/1961   MO#:[id number]   KPT#:[id number]  Subjective:  Suzanne [name] feels better overall this morning.  States that she was able to walk a short distance with therapy yesterday.  She is anxious to continue to work with them to get stronger.  She remains on O2 at 4 L/min by nasal cannula and is still on BiPAP at night.  Breathing somewhat improved.  Left shoulder/arm pain is well controlled.  Objective: morbidly obese White woman examined in bed Vitals:   01/13/19 0800 01/13/19 1019  BP:    Pulse:  (!) 105  Resp:  (!) 27  Temp: 97.9 F (36.6 C)   SpO2:  98%    Body mass index is 32.92 kg/m.  Intake/Output Summary (Last 24 hours) at 01/13/2019 1145 Last data filed at 01/13/2019 0600 Gross per 24 hour  Intake 1137.76 ml  Output 800 ml  Net 337.76 ml   General: Awake and alert, no distress HEENT: No thrush or mucositis Lungs: Clear to auscultation bilaterally Cardiovascular: Regular rate and rhythm, no murmur, no lower extremity edema Abdomen: Positive bowel sounds, soft, nontender Neuro: Alert and oriented x3  CBG (last 3)  Recent Labs    01/10/19 1558  GLUCAP 87     Labs:  Lab Results  Component Value Date   WBC 8.0 01/13/2019   HGB 8.9 (L) 01/13/2019   HCT 29.1 (L) 01/13/2019   MCV 106.6 (H) 01/13/2019   PLT 326 01/13/2019   NEUTROABS 5.0 12/25/2018    _0 @  Urine Studies No results for input(s): UHGB, CRYS in the last 72 hours.  Invalid input(s): UACOL, UAPR, USPG, UPH, UTP, UGL, UKET, UBIL, UNIT, UROB, ULEU, UEPI, UWBC, URBC, UBAC, CAST, [place], [place]  Basic Metabolic Panel: Recent Labs  Lab 01/09/19 0445 01/09/19 1947 01/10/19 0500 01/11/19 0530 01/12/19 0415 01/13/19 0500  NA 139 137 139 139 139 137  K 3.8 3.7 3.8 2.9* 3.9 3.9  CL 104  --  102 101 100 100  CO2 30  --  _1 GLUCOSE 105* 101* 135* 95 91 101*  BUN 9  --  _2 CREATININE 0.38*  --  0.38* 0.38* 0.36* 0.34*  CALCIUM 7.8*  --  7.2* 6.1* 6.3*  5.2*   GFR Estimated Creatinine Clearance: 77.9 mL/min (A) (by C-G formula based on SCr of 0.34 mg/dL (L)). Liver Function Tests: Recent Labs  Lab 01/07/19 1014 01/11/19 1036  AST 18 15  ALT 11 8  ALKPHOS 227* 213*  BILITOT 0.4 0.6  PROT 5.9* 6.3*  ALBUMIN 2.6* 2.9*   No results for input(s): LIPASE, AMYLASE in the last 168 hours. No results for input(s): AMMONIA in the last 168 hours. Coagulation profile No results for input(s): INR, PROTIME in the last 168 hours.  CBC: Recent Labs  Lab 01/09/19 0445 01/09/19 1947 01/10/19 0500 01/11/19 0530 01/12/19 0415 01/13/19 0500  WBC 8.7  --  9.6 9.7 8.5 8.0  HGB 8.2* 8.8* 9.0* 8.9* 8.8* 8.9*  HCT 28.5* 26.0* 29.2* 29.7* 29.7* 29.1*  MCV 113.1*  --  104.3* 106.8* 106.5* 106.6*  PLT 377  --  341 345 314 326   Cardiac Enzymes: No results for input(s): CKTOTAL, CKMB, CKMBINDEX, TROPONINI in the last 168 hours. BNP: Invalid input(s): POCBNP CBG: Recent Labs  Lab 01/10/19 0747 01/10/19 1558  GLUCAP 98 87   D-Dimer No results for input(s): DDIMER in the last 72 hours. Hgb A1c No results for input(s): HGBA1C  in the last 72 hours. Lipid Profile No results for input(s): CHOL, HDL, LDLCALC, TRIG, CHOLHDL, LDLDIRECT in the last 72 hours. Thyroid function studies No results for input(s): TSH, T4TOTAL, T3FREE, THYROIDAB in the last 72 hours.  Invalid input(s): FREET3 Anemia work up No results for input(s): VITAMINB12, FOLATE, FERRITIN, TIBC, IRON, RETICCTPCT in the last 72 hours. Microbiology Recent Results (from the past 240 hour(s))  Surgical pcr screen     Status: Abnormal   Collection Time: 01/09/19  1:49 PM   Specimen: Nasal Mucosa; Nasal Swab  Result Value Ref Range Status   MRSA, PCR NEGATIVE NEGATIVE Final   Staphylococcus aureus POSITIVE (A) NEGATIVE Final    Comment: (NOTE) The Xpert SA Assay (FDA approved for NASAL specimens in patients 28 years of age and older), is one component of a  comprehensive surveillance program. It is not intended to diagnose infection nor to guide or monitor treatment. Performed at [facility], [place] [address], [place], [place] [address]       Studies:  Dg Chest Port 1 View  Result Date: 01/13/2019 CLINICAL DATA:  Atelectasis EXAM: PORTABLE CHEST 1 VIEW COMPARISON:  01/10/2019 FINDINGS: Cardiac shadow is stable. Endotracheal tube is been removed as has a gastric catheter. Right-sided PICC line remains in satisfactory position. Elevation of the right hemidiaphragm is again seen and stable. Bibasilar atelectatic changes are noted increased from the prior study. Postsurgical changes in the left shoulder are noted. IMPRESSION: Interval removal of tubes and lines. Slight increase in bibasilar atelectasis. Electronically Signed   By: Inez Catalina M.D.   On: 01/13/2019 07:20    Assessment: 58 y.o. [place] woman presenting 10/26/2018 with right-sided inflammatory breast cancer, stage IV, involving lungs, lymph nodes and bones, as follows:             (a) chest CT scan 10/26/2018 shows bilateral pleural effusions, possible lymphangitic spread of tumor, diffuse bony metastatic disease, and significant axillary mediastinal and hilar adenopathy             (b) bone scan 10/27/2018 is a "near [place]" consistent with widespread bony metastatic disease             (c) head CT with and without contrast 10/30/2018 shows no intracranial metastatic disease, multiple calvarial lesions             (d) CA-27-29 on 10/27/2018 was 1810.4  (1) pleural fluid from right thoracentesis 10/28/2018 confirms malignant cells consistent with a breast primary, strongly estrogen and progesterone receptor positive, HER-2 not amplified, with an MIB-1 of 2%  (2) anastrozole started 10/29/2018             (a) palbociclib to start 11/13/2018             (b) denosumab/xgeva to start 11/13/2018  (3) associated problems:             (a) hypoxia secondary to  effusions, deconditioning, morbid obesity             (b) pain from bone lesions             (c) right upper extremity lymphedema             (d) anemia, requiring transfusion             (e) poor venous access  (f) poor social situation  (4) genetics testing pending     Plan:  Neidra seems to be improving.  We discussed trying to get more mobile with  physical and Occupational Therapy.  She seems anxious to do so.  I would recommend titrating her O2 down to 2 L as she tolerates.  This was her baseline O2 requirement prior to admission.  Recommend that her sats stay above 90%.  From a breast cancer standpoint, recommend that she continue anastrozole and we will start her back on Ibrance 100 mg daily on 01/15/2019.  Orders have been entered.  She will need to use her home supply.  This was discussed with nursing and pharmacy.  Will follow with you.  Mikey Bussing, NP 01/13/2019  11:45 AM Medical Oncology and Hematology [facility] [address] [place], [place] [address] Tel. [phone]    Fax. [phone]   ADDENDUM: To me the most important change in the last few days is Adriana's attitude--- she is much more positive/forward-looking/motivated. I think she will be able to benefit from the planned PT/OT.  From a cancer point of view the plan is still to continue antiestroges and resume palbociclib.  Please let me knowif we can be of further help at this point  Chauncey Cruel, MD Medical Oncology and Hematology [facility] [address] [place], [place] [address] Tel. [phone]    Fax. [phone]

## 2019-01-13 NOTE — Progress Notes (Signed)
CRITICAL VALUE ALERT  Critical Value:  CA 5.2  Date & Time Notied:  01/13/19   Provider Notified: [phone]  Orders Received/Actions taken: Pending

## 2019-01-13 NOTE — Progress Notes (Signed)
Patient off BiPAP at this time; no distress noted. RT will continue to monitor patient.

## 2019-01-13 NOTE — Progress Notes (Signed)
Occupational Therapy Treatment Patient Details Name: Suzanne [name] MRN: [id number] DOB: August 05, 1960 Today's Date: 01/13/2019    History of present illness Pt admitted with acute on chronic respiratory failure and hx of breast CA with mets to bone (currently with L UE in sling 2* pathologic fx) and lungs, chronic diastolic heart failure, anemia and pleural effusion. Pt now s/p LUE ORIF 8/14 and off vent 8/16   OT comments  Pt tolerated seated dangle with LUE this day as well as lap slides.  (2 sets of 5 lap slides)  Follow Up Recommendations  SNF    Equipment Recommendations  Other (comment)(defer to next venue)    Recommendations for Other Services      Precautions / Restrictions Precautions Precautions: Fall;Shoulder Type of Shoulder Precautions: Elbow, wrist, hand ROM ok; Pendulums and scap retraction ok; No AROM/PROM to shoulder Shoulder Interventions: Shoulder sling/immobilizer Required Braces or Orthoses: Sling Restrictions Weight Bearing Restrictions: Yes LUE Weight Bearing: Non weight bearing       Mobility Bed Mobility   Bed Mobility: Sit to Supine       Sit to supine: Max assist      Transfers Overall transfer level: Needs assistance Equipment used: 1 person hand held assist Transfers: Sit to/from [facility] Sit to Stand: Min assist;+2 safety/equipment Stand pivot transfers: Min assist;+2 safety/equipment            Balance Overall balance assessment: Needs assistance         Standing balance support: Single extremity supported Standing balance-Leahy Scale: Poor                             ADL either performed or assessed with clinical judgement   ADL   Eating/Feeding: Minimal assistance;Sitting   Grooming: Minimal assistance;Sitting;Standing                   Toilet Transfer: Moderate assistance;Stand-pivot;Cueing for safety Toilet Transfer Details (indicate cue type and reason): chair to  bed Toileting- Clothing Manipulation and Hygiene: Moderate assistance;Sit to/from stand;Cueing for safety;Cueing for sequencing               Vision Baseline Vision/History: No visual deficits            Cognition Arousal/Alertness: Awake/alert Behavior During Therapy: WFL for tasks assessed/performed Overall Cognitive Status: Within Functional Limits for tasks assessed                                          Exercises Shoulder Exercises Elbow Flexion: AROM;AAROM;Left;Seated(able to tolerate x2) Elbow Extension: AROM;AAROM;Seated(able to tolerate x2) Wrist Flexion: AROM;10 reps;Seated Wrist Extension: AROM;10 reps;Seated Digit Composite Flexion: AROM;10 reps;Seated Composite Extension: AROM;10 reps;Seated   Shoulder Instructions Shoulder Instructions Correct positioning of sling/immobilizer: Moderate assistance Pendulum exercises (written home exercise program): Moderate assistance ROM for elbow, wrist and digits of operated UE: Minimal assistance     General Comments      Pertinent Vitals/ Pain       Pain Assessment: 0-10 Pain Score: 2  Pain Location: L shoulder Pain Descriptors / Indicators: Discomfort;Grimacing Pain Intervention(s): Limited activity within patient's tolerance;Monitored during session         Frequency  Min 2X/week        Progress Toward Goals  OT Goals(current goals can now be found in the care plan section)  Progress towards  OT goals: Progressing toward goals     Plan Discharge plan remains appropriate       AM-PAC OT "6 Clicks" Daily Activity     Outcome Measure   Help from another person eating meals?: A Little Help from another person taking care of personal grooming?: A Little Help from another person toileting, which includes using toliet, bedpan, or urinal?: A Little Help from another person bathing (including washing, rinsing, drying)?: A Lot Help from another person to put on and taking off regular  upper body clothing?: A Lot Help from another person to put on and taking off regular lower body clothing?: A Lot 6 Click Score: 15    End of Session Equipment Utilized During Treatment: Gait belt;Other (comment);Oxygen(QC)  OT Visit Diagnosis: Other abnormalities of gait and mobility (R26.89);Muscle weakness (generalized) (M62.81);Unsteadiness on feet (R26.81);Pain Pain - Right/Left: Left Pain - part of body: Shoulder;Arm   Activity Tolerance Patient tolerated treatment well   Patient Left in chair;with call bell/phone within reach;with chair alarm set;with nursing/sitter in room   Nurse Communication Mobility status        Time: 1330-1350 OT Time Calculation (min): 20 min  Charges: OT General Charges $OT Visit: 1 Visit OT Treatments $Self Care/Home Management : 8-22 mins  Kari Baars, [place] [phone] Office- [place], Edwena Felty D 01/13/2019, 2:32 PM

## 2019-01-13 NOTE — Progress Notes (Signed)
Orthopedics Progress Note  Subjective: No complaints with the left arm  Objective:  Vitals:   01/13/19 0600 01/13/19 0700  BP: (!) 136/53 (!) 109/56  Pulse: 94 91  Resp: (!) 27 (!) 22  Temp:    SpO2: 100% 99%    General: Awake and alert  Musculoskeletal: Left shoulder/arm wound healing well. No swelling and no pain with AAROM Neurovascularly intact  Lab Results  Component Value Date   WBC 8.0 01/13/2019   HGB 8.9 (L) 01/13/2019   HCT 29.1 (L) 01/13/2019   MCV 106.6 (H) 01/13/2019   PLT 326 01/13/2019       Component Value Date/Time   NA 137 01/13/2019 0500   K 3.9 01/13/2019 0500   CL 100 01/13/2019 0500   CO2 29 01/13/2019 0500   GLUCOSE 101 (H) 01/13/2019 0500   BUN 6 01/13/2019 0500   CREATININE 0.34 (L) 01/13/2019 0500   CREATININE 0.63 12/22/2018 1219   CALCIUM 5.2 (LL) 01/13/2019 0500   GFRNONAA >60 01/13/2019 0500   GFRNONAA >60 12/22/2018 1219   GFRAA >60 01/13/2019 0500   GFRAA >60 12/22/2018 1219    Lab Results  Component Value Date   INR 1.2 11/15/2018   INR 1.1 10/27/2018    Assessment/Plan: POD #5 s/p Procedure(s): OPEN REDUCTION INTERNAL FIXATION (ORIF) HUMERAL FRACTURE Stable from ortho standpoint, will sign off at this point, thank you Will need ortho F/U in two weeks in the office, [place]. Veverly Fells, MD 01/13/2019 7:46 AM

## 2019-01-13 NOTE — Progress Notes (Signed)
PROGRESS NOTE    Suzanne [name]  GNO:[id number] DOB: Oct 02, 1960 DOA: 12/25/2018 PCP: Patient, No Pcp Per   Brief Narrative: 58 year old with past medical history significant for stage IV breast cancer with known metastasis to bone, lung and lymph nodes and malignant pleural effusion admitted on 12/25/2018 with worsening shortness of breath and altered mental status.  Of note patient was diagnosed with metastatic breast cancer in early June 2020.  At the time of diagnosis she was found to have widespread metastatic disease and pathologic fracture in her right right clavicle.  She subsequently underwent neoadjuvant chemotherapy but has also had her treatment complicated by recurrent malignant right-sided pleural effusion, neutropenia and thrombocytopenia.  In the emergency room patient was noted to be septic, likely secondary to sacral decubital ulcer, blood cultures on admission grew MSSA.  ID was consulted and recommendation was for 4 weeks of Ancef, which finish on 8/27.  Patient was also found to have a pathologic left humeral fracture.  Examination by orthopedic surgery recommended surgery this admission.  Patient underwent  left Humerus fracture open reduction and internal fixation using Bionmet ALPS plate on 1/69.  Post surgery patient develop acute respiratory distress and she was reintubated.  Patient was subsequently extubated on 8/15, she was placed on BiPAP.  Her respiratory status has been stable, on BiPAP at bedtime.  Respiratory failure was thought to be related to pulmonary edema and pleural effusion and chronic elevation of the right hemi-diaphragma. she was a started on IV Lasix .      Assessment & Plan:   Principal Problem:   Acute on chronic respiratory failure with hypoxia (HCC) Active Problems:   Bone metastasis (HCC)   Pleural effusion, bilateral   Carcinoma of breast, estrogen receptor positive, stage 4, right (HCC)   Pressure injury of skin   Pneumonia   Respiratory  failure, acute (HCC)   Fracture closed, humerus, shaft   Acute on chronic hypoxic respiratory failure: Patient presented with worsening hypoxia.  Chest x-ray did not show any acute finding.  She has a known right malignant pleural effusion.  Last thoracentesis was 6/20.  Per chart review, there was some concern of possible lymphangitic spread of tumor in the lungs. Patient has been stable on 2 L of oxygen.  She was initially on IV Solu-Medrol which has been discontinued. Patient had to be re-intubated post surgery. She became hypoxic and respiratory distress.  She was extubated this morning 8-15, place on BIPAP , appreciate CCM assistance.  Diuresis. Lasix 20 IV BID. CCM recommending BIPAP at HS and PRN during day.  Continue with incentive spirometry, out of bed.  Continue with diuresis.  -4 L today.  MSSA bacteremia/sepsis; -Sepsis present on admission.  Likely source a stage II/III sacral decubitus ulcer. -ID consultation was obtained.. -Sacral wound did not require any surgical intervention.. -TTE did not show any signs of endocarditis.. -Repeated blood cultures on 7/30 first did not show any growth -Per ID recommendation, patient is currently on 4 weeks course of Ancef until 8/27 -She will need a PICC line close to be discharged  2-Stage IV metastatic breast cancer to bone and lung: -Oncology follow up appreciated.. -Patient to continue oral chemotherapy with anastrozole, palbociclib and denosumab/xgeva.   3-Pathological left femur fractures: Noted on x-ray obtained on 7/25 as an outpatient for shoulder pain. Seen by radiation oncology.  Apparently patient had an appointment with orthopedic on 8/6 which she missed because she was in the hospital.  Seen by Dr. Veverly Fells with  orthopedic who recommended surgery this admission. Dr. Maryland Pink discussed case with Dr. Jana Hakim and orthopedic doctor and decision was made to proceed with surgery this admission. S/P left Humerus fracture open  reduction and internal fixation using Bionmet ALPS plate    Hypokalemia replaced. Continue with oral supplement while patient is on lasix.  Hypocalcemia;  Replete IV.   Anemia of chronic disease, likely related to malignancy. She has received blood transfusion as an outpatient. Currently hemoglobin has been stable around 8. Hb stable.   Elevated d-dimer: CT angios chest was negative for PE, Dopplers of lower extremity negative for DVT.  Elevated alkaline phosphatase: Secondary to metastatic bone disease. Chronic diastolic heart failure: Appears compensated.  Continue with Coreg Resume lasix.   Impaired mobility: Patient will likely require rehab admission  Stage II-3 decubitus ulcer sacrum present on admission Local care.  Pressure Injury 12/25/18 Sacrum Mid Deep Tissue Injury - Purple or maroon localized area of discolored intact skin or blood-filled blister due to damage of underlying soft tissue from pressure and/or shear. evolving into stage 2 in patchy areas when asse (Active)  12/25/18 1700  Location: Sacrum  Location Orientation: Mid  Staging: Deep Tissue Injury - Purple or maroon localized area of discolored intact skin or blood-filled blister due to damage of underlying soft tissue from pressure and/or shear.  Wound Description (Comments): evolving into stage 2 in patchy areas when assessed on 8/2  Present on Admission: Yes     Nutrition Problem: Increased nutrient needs Etiology: chronic illness, cancer and cancer related treatments    Signs/Symptoms: estimated needs    Interventions: Ensure Enlive (each supplement provides 350kcal and 20 grams of protein), MVI  Estimated body mass index is 32.92 kg/m as calculated from the following:   Height as of this encounter: 5\' 3"  (1.6 m).   Weight as of this encounter: 84.3 kg.   DVT prophylaxis: Lovenox Code Status: Full code Family Communication: Discussed with patient Disposition Plan: needs PT, SNF  Consultants:   Orthopedics  Oncology  Infectious disease  Procedures:   None  Antimicrobials:  IV Ancef from 7/31---8/14  Subjective: She is breathing better, shoulder pain is controlled.  Objective: Vitals:   01/13/19 0500 01/13/19 0600 01/13/19 0700 01/13/19 0800  BP: (!) 132/45 (!) 136/53 (!) 109/56   Pulse: 95 94 91   Resp: (!) 21 (!) 27 (!) 22   Temp:    97.9 F (36.6 C)  TempSrc:    Oral  SpO2: 100% 100% 99%   Weight: 84.3 kg     Height:        Intake/Output Summary (Last 24 hours) at 01/13/2019 0826 Last data filed at 01/13/2019 0600 Gross per 24 hour  Intake 1237.76 ml  Output 1800 ml  Net -562.24 ml   Filed Weights   01/09/19 0603 01/11/19 0607 01/13/19 0500  Weight: 81.2 kg 82.9 kg 84.3 kg    Examination:  General exam: NAD Respiratory system: Decreased breath sounds, bilateral crackles Cardiovascular system: S1, S2 irregular regular rate Gastrointestinal system; bowel sounds present soft nontender not distended Central nervous system: Alert Extremities: Symmetric power Skin: No rashes  Data Reviewed: I have personally reviewed following labs and imaging studies  CBC: Recent Labs  Lab 01/09/19 0445 01/09/19 1947 01/10/19 0500 01/11/19 0530 01/12/19 0415 01/13/19 0500  WBC 8.7  --  9.6 9.7 8.5 8.0  HGB 8.2* 8.8* 9.0* 8.9* 8.8* 8.9*  HCT 28.5* 26.0* 29.2* 29.7* 29.7* 29.1*  MCV 113.1*  --  104.3* 106.8*  106.5* 106.6*  PLT 377  --  341 345 314 824   Basic Metabolic Panel: Recent Labs  Lab 01/09/19 0445 01/09/19 1947 01/10/19 0500 01/11/19 0530 01/12/19 0415 01/13/19 0500  NA 139 137 139 139 139 137  K 3.8 3.7 3.8 2.9* 3.9 3.9  CL 104  --  102 101 100 100  CO2 30  --  28 29 28 29   GLUCOSE 105* 101* 135* 95 91 101*  BUN 9  --  10 10 8 6   CREATININE 0.38*  --  0.38* 0.38* 0.36* 0.34*  CALCIUM 7.8*  --  7.2* 6.1* 6.3* 5.2*   GFR: Estimated Creatinine Clearance: 77.9 mL/min (A) (by C-G formula based on SCr of 0.34 mg/dL (L)).  Liver Function Tests: Recent Labs  Lab 01/07/19 1014 01/11/19 1036  AST 18 15  ALT 11 8  ALKPHOS 227* 213*  BILITOT 0.4 0.6  PROT 5.9* 6.3*  ALBUMIN 2.6* 2.9*   No results for input(s): LIPASE, AMYLASE in the last 168 hours. No results for input(s): AMMONIA in the last 168 hours. Coagulation Profile: No results for input(s): INR, PROTIME in the last 168 hours. Cardiac Enzymes: No results for input(s): CKTOTAL, CKMB, CKMBINDEX, TROPONINI in the last 168 hours. BNP (last 3 results) No results for input(s): PROBNP in the last 8760 hours. HbA1C: No results for input(s): HGBA1C in the last 72 hours. CBG: Recent Labs  Lab 01/10/19 0747 01/10/19 1558  GLUCAP 98 87   Lipid Profile: No results for input(s): CHOL, HDL, LDLCALC, TRIG, CHOLHDL, LDLDIRECT in the last 72 hours. Thyroid Function Tests: No results for input(s): TSH, T4TOTAL, FREET4, T3FREE, THYROIDAB in the last 72 hours. Anemia Panel: No results for input(s): VITAMINB12, FOLATE, FERRITIN, TIBC, IRON, RETICCTPCT in the last 72 hours. Sepsis Labs: No results for input(s): PROCALCITON, LATICACIDVEN in the last 168 hours.  Recent Results (from the past 240 hour(s))  Surgical pcr screen     Status: Abnormal   Collection Time: 01/09/19  1:49 PM   Specimen: Nasal Mucosa; Nasal Swab  Result Value Ref Range Status   MRSA, PCR NEGATIVE NEGATIVE Final   Staphylococcus aureus POSITIVE (A) NEGATIVE Final    Comment: (NOTE) The Xpert SA Assay (FDA approved for NASAL specimens in patients 56 years of age and older), is one component of a comprehensive surveillance program. It is not intended to diagnose infection nor to guide or monitor treatment. Performed at [facility], [place] [address], [place], [place] [address]          Radiology Studies: Dg Chest Port 1 View  Result Date: 01/13/2019 CLINICAL DATA:  Atelectasis EXAM: PORTABLE CHEST 1 VIEW COMPARISON:  01/10/2019 FINDINGS: Cardiac shadow is  stable. Endotracheal tube is been removed as has a gastric catheter. Right-sided PICC line remains in satisfactory position. Elevation of the right hemidiaphragm is again seen and stable. Bibasilar atelectatic changes are noted increased from the prior study. Postsurgical changes in the left shoulder are noted. IMPRESSION: Interval removal of tubes and lines. Slight increase in bibasilar atelectasis. Electronically Signed   By: Inez Catalina M.D.   On: 01/13/2019 07:20        Scheduled Meds: . anastrozole  1 mg Oral Daily  . carvedilol  6.25 mg Oral BID WC  . chlorhexidine  15 mL Mouth Rinse BID  . Chlorhexidine Gluconate Cloth  6 each Topical Daily  . docusate sodium  100 mg Oral BID  . enoxaparin (LOVENOX) injection  40 mg Subcutaneous Q24H  .  feeding supplement  1 Container Oral TID BM  . feeding supplement (ENSURE ENLIVE)  237 mL Oral BID BM  . furosemide  20 mg Intravenous BID  . LORazepam  1 mg Intravenous Once  . mouth rinse  15 mL Mouth Rinse q12n4p  . multivitamin with minerals  1 tablet Oral Daily  . nystatin   Topical TID  . [START ON 01/15/2019] palbociclib  100 mg Oral Daily  . pantoprazole (PROTONIX) IV  40 mg Intravenous Daily  . senna  1 tablet Oral Daily  . sodium chloride flush  3 mL Intravenous Q12H   Continuous Infusions: . sodium chloride 250 mL (01/12/19 1304)  . sodium chloride 10 mL/hr at 01/13/19 0600  . calcium gluconate 2,000 mg (01/13/19 0759)  .  ceFAZolin (ANCEF) IV 200 mL/hr at 01/13/19 0600     LOS: 19 days    Time spent: 35 minutes.     Elmarie Shiley, MD [facility] Pager [phone]  If 7PM-7AM, please contact night-coverage [email or web address] Password [facility] 01/13/2019, 8:26 AM

## 2019-01-13 NOTE — Progress Notes (Signed)
NAME:  Suzanne [name], MRN:  [id number], DOB:  20-Sep-1960, LOS: 107 ADMISSION DATE:  12/25/2018, CONSULTATION DATE:  8/14 REFERRING MD:  Tyrell Antonio [facility]), CHIEF COMPLAINT:  Vent management    Brief History   58yo female with hx Stage IV breast cancer with known mets to bone and lung with malignant effusion undergoing current oral chemo (just dx June 2020).  She was initially admitted 7/30 with AMS and dyspnea.  She was admitted by hospitalist with concern for sepsis r/t sacral decub. Blood cultures this admit revealed MSSA bacteremia and she received 2 weeks of ancef compelted on 8/14.  She was found to have pathologic L humeral fx and underwent surgical repair ORIF 8/14 after abx course was complete. She was extubated post op but developed respiratory distress with tachypnea, hypoxia, respiratory acidosis, ?pulmonary edema and was reintubated in PACU.  PCCM called for vent management.    Significant Hospital Events   8/14 ORIF L humerus fx , Reintubated in PACU 8/15 extubated 8/17 feeling better   Consults:  Ortho  ID  Heme/onc   Procedures: L humeral fx repair  ETT 8/14 >> 8/15  Significant Diagnostic Tests:   CT angio chest 7/30 >> Small bilateral pleural effusions. Effusion on the left has decreased since the prior CT. Bandlike opacities in both lungs may be due to scar or fibrosis,unchanged. Diffuse osseous metastatic disease and multiple pathologic fractures as seen prior   6/20 [place] thora LT >> 400 cc 6/3 [place] thora LT >> 350 cc 6/2 [place] thora RT >> 600cc , malignant    Micro Data:  BC x 2 7/31>>> neg  BC x 2 7/30>>> MSSA   Antimicrobials:  Ancef 7/30>>>8/14  Interim history/subjective:  Continuing to improve.  Feels better.   Objective   Blood pressure (Abnormal) 109/56, pulse 91, temperature 97.9 F (36.6 C), temperature source Oral, resp. rate (Abnormal) 22, height 5\' 3"  (1.6 m), weight 84.3 kg, last menstrual period 05/28/2013, SpO2 99 %.    Vent Mode: BIPAP  FiO2 (%):  [30 %] 30 % Set Rate:  [12 bmp] 12 bmp PEEP:  [5 cmH20] 5 cmH20   Intake/Output Summary (Last 24 hours) at 01/13/2019 0827 Last data filed at 01/13/2019 0600 Gross per 24 hour  Intake 1237.76 ml  Output 1800 ml  Net -562.24 ml   Filed Weights   01/09/19 0603 01/11/19 0607 01/13/19 0500  Weight: 81.2 kg 82.9 kg 84.3 kg    Examination: General 58 year old female patient currently resting in bed.  She actually appears much improved when comparing exam from 8/17 HEENT normocephalic atraumatic.  She does have pressure relief dressings over her face to protect her from BiPAP mask pressure injuries there is no JVD her mucous membranes are moist Pulmonary: Diminished bases no accessory use Cardiac: Regular rate and rhythm without murmur rub or gallop Abdomen: Soft nontender no organomegaly Extremities: Warm dry.  Her left upper extremity is in a splint the CMS is intact Neuro: Awake oriented no focal deficits  Resolved Hospital Problem list   MSSA bacteremia  TTE without signs of endocarditis , blood cultures negative 2 weeks ancef completed 8/14   Assessment & Plan:   Acute on chronic respiratory failure Pulmonary edema Intermittent pleural effusions w/ malignant right effusion Pulmonary edema Decompensated diastolic HF Atelectasis  Elevated right hemidiaphragm  Stage IV Breast cancer  Pathologic left humerus fx  Recent bacteremia (MSSA)   Acute on chronic hypoxic respiratory failure 2/2 volume overload/pulmonary edema superimposed on Malignant pleural effusion  due to  Metastatic breast cancer and Elevated right hemidiaphragm/ Atelectasis  -Portable chest x-ray was personally reviewed.  This demonstrates as expected lower volume film when comparing to film on mechanical ventilation.  She continues to demonstrate right hemidiaphragm elevation, does appear to have mild interstitial prominence which could reflect element of pulmonary edema there is bibasilar atelectasis    plan  Cont lasix IS during day and OOB BIPAP at HS (until stronger) Don't see a need for thora  Push pulm hygiene  Aspiration precautions    All other issues per medical, ortho and oncology teams ->PCCM will be available as needed.   Erick Colace ACNP-BC [place] Pager # [phone] OR # [phone] if no answer    01/13/2019

## 2019-01-13 NOTE — Progress Notes (Signed)
  Speech Language Pathology Treatment: Dysphagia  Patient Details Name: Suzanne [name] MRN: [id number] DOB: 06/14/1960 Today's Date: 01/13/2019 Time: 3235-5732 SLP Time Calculation (min) (ACUTE ONLY): 21 min  Assessment / Plan / Recommendation Clinical Impression  Pt sitting upright in chair upon SLP arrival, partially consumed breakfast plate in front of her.  She is slumped down in the chair and thus SLP received assistance to slide her up in the chair.  Per chart review, pt with intake of nearly 100%. She reports he voice to be at level 7/10 and swallow level 8/10 indicating improvement.  Pt continues to reports occasional discomfort on the left side of her throat - more with swallowing and inconsistent. SLP advised her to inform RN if pain is difficult to manage.    SLP provided pt with intake of magic cup and liquids. Swallow is timely with no indication of aspiration or dysphagia.  Recommend to advance diet to Dys3/thin due to pt's occasional increase dyspnea with exertion.  Pt admits to using caution and being concerned with particulate foods.  Educated her to compensation strategies for particulates, including using puree to improve cohesiveness of boluses and decrease aspiration risk.  Pt desires to continue medications with puree - whole.  Min cues needed to teach back compensation strategies.    Pt's voice remains mildly dysphonic - will follow up to assure tolerance and for education.  Pt would benefit from RMST (respiratory muscle strength training) to strengthen respiratory muscles and thus protective cough, laryngeal elevation if MD agrees.      HPI HPI: Pt admitted with acute on chronic respiratory failure and hx of breast CA with mets to bone (currently with L UE in sling 2* pathologic fx) and lungs, chronic diastolic heart failure, anemia and pleural effusion. Pt now s/p LUE ORIF 8/14. She was extubated post op but developed respiratory distress with tachypnea, hypoxia, respiratory  acidosis, ?pulmonary edema and was reintubated in PACU. Extubated 8/16. Per PCCM, has had shallow breathing due to elevated right hemidiaphragm and effusions; gradually weaning off BiPAP.Pt has been off of Bipap and tolerating well.  SLP visit to determine readiness for dietary advancement and to educate pt to recommendations.    Pt reports being excessively careful with eating most notably with particulate foods and medicine.Marland Kitchen      SLP Plan  Continue with current plan of care       Recommendations  Diet recommendations: Dysphagia 3 (mechanical soft);Thin liquid(pt desires dys3/thin diet and SlP agrees due to pt's respiratory status) Liquids provided via: Cup;Straw Medication Administration: Whole meds with puree Supervision: Patient able to self feed Compensations: Slow rate;Small sips/bites(start all intake with liquids) Postural Changes and/or Swallow Maneuvers: Seated upright 90 degrees;Upright 30-60 min after meal                Oral Care Recommendations: Oral care QID Follow up Recommendations: Other (comment)(tba) SLP Visit Diagnosis: Dysphagia, unspecified (R13.10) Plan: Continue with current plan of care       [place], Tyasia Packard Ann 01/13/2019, 11:15 AM Luanna Salk, MS [facility] SLP Acute Rehab Services Pager [phone] Office [phone]

## 2019-01-14 ENCOUNTER — Telehealth: Payer: Self-pay | Admitting: *Deleted

## 2019-01-14 DIAGNOSIS — I5032 Chronic diastolic (congestive) heart failure: Secondary | ICD-10-CM | POA: Diagnosis present

## 2019-01-14 DIAGNOSIS — I5033 Acute on chronic diastolic (congestive) heart failure: Secondary | ICD-10-CM

## 2019-01-14 LAB — COMPREHENSIVE METABOLIC PANEL
ALT: 6 U/L (ref 0–44)
AST: 13 U/L — ABNORMAL LOW (ref 15–41)
Albumin: 2.7 g/dL — ABNORMAL LOW (ref 3.5–5.0)
Alkaline Phosphatase: 201 U/L — ABNORMAL HIGH (ref 38–126)
Anion gap: 11 (ref 5–15)
BUN: 11 mg/dL (ref 6–20)
CO2: 28 mmol/L (ref 22–32)
Calcium: 6.7 mg/dL — ABNORMAL LOW (ref 8.9–10.3)
Chloride: 97 mmol/L — ABNORMAL LOW (ref 98–111)
Creatinine, Ser: 0.39 mg/dL — ABNORMAL LOW (ref 0.44–1.00)
GFR calc Af Amer: >60 mL/min (ref >60)
GFR calc non Af Amer: >60 mL/min (ref >60)
Glucose, Bld: 109 mg/dL — ABNORMAL HIGH (ref 70–99)
Potassium: 2.9 mmol/L — ABNORMAL LOW (ref 3.5–5.1)
Sodium: 136 mmol/L (ref 135–145)
Total Bilirubin: 0.6 mg/dL (ref 0.3–1.2)
Total Protein: 6.5 g/dL (ref 6.5–8.1)

## 2019-01-14 MED ORDER — POTASSIUM CHLORIDE CRYS ER 20 MEQ PO TBCR
40.0000 meq | EXTENDED_RELEASE_TABLET | ORAL | Status: AC
  Administered 2019-01-14 (×2): 40 meq via ORAL
  Filled 2019-01-14 (×2): qty 2

## 2019-01-14 NOTE — Progress Notes (Signed)
Occupational Therapy Treatment Patient Details Name: Suzanne [name] MRN: [id number] DOB: May 10, 1961 Today's Date: 01/14/2019    History of present illness Pt admitted with acute on chronic respiratory failure and hx of breast CA with mets to bone (currently with L UE in sling 2* pathologic fx) and lungs, chronic diastolic heart failure, anemia and pleural effusion. Pt now s/p LUE ORIF 8/14 and off vent 8/16   OT comments  Pt just back to bed with RN. Pt agreed to LUE exercise including lap slides and elbow, wrist and hand ROM.  Pt did well with lap slides.    Follow Up Recommendations  SNF    Equipment Recommendations  None recommended by OT    Recommendations for Other Services      Precautions / Restrictions Precautions Precautions: Fall;Shoulder Type of Shoulder Precautions: Elbow, wrist, hand ROM ok; Pendulums and scap retraction ok; No AROM/PROM to shoulder Shoulder Interventions: Shoulder sling/immobilizer Required Braces or Orthoses: Sling Restrictions Weight Bearing Restrictions: Yes LUE Weight Bearing: Non weight bearing Other Position/Activity Restrictions: L UE       Mobility Bed Mobility Overal bed mobility: Needs Assistance Bed Mobility: Supine to Sit     Supine to sit: Mod assist;HOB elevated     General bed mobility comments: mod A for trunk and  bil LEs. Pt needed assist to scoot hips with bed pad. Increased time.  Once upright, able to static sit Indep EOB x 4 min   RR increased to high 30"s  Transfers Overall transfer level: Needs assistance Equipment used: None Transfers: Sit to/from Stand Sit to Stand: Min assist;+2 safety/equipment         General transfer comment: Assist to power up, steady, control descent. VCs safety, technique. Increased time.    Balance                                           ADL either performed or assessed with clinical judgement        Vision Patient Visual Report: No change from  baseline            Cognition Arousal/Alertness: Awake/alert Behavior During Therapy: WFL for tasks assessed/performed Overall Cognitive Status: Within Functional Limits for tasks assessed                                 General Comments: Pt crying upone entering room having a "moment".  Pt feeling wet and uncomfortable        Exercises Shoulder Exercises Elbow Flexion: AROM;AAROM;Left;Seated(able to tolerate x2) Elbow Extension: AROM;AAROM;Seated(able to tolerate x2) Wrist Flexion: AROM;10 reps;Seated Wrist Extension: AROM;10 reps;Seated Digit Composite Flexion: AROM;10 reps;Seated Composite Extension: AROM;10 reps;Seated   Shoulder Instructions       General Comments      Pertinent Vitals/ Pain       Pain Assessment: Faces Faces Pain Scale: Hurts little more Pain Location: L UE with lap slides Pain Descriptors / Indicators: Discomfort;Grimacing Pain Intervention(s): Monitored during session;Repositioned     Prior Functioning/Environment              Frequency  Min 2X/week        Progress Toward Goals  OT Goals(current goals can now be found in the care plan section)  Progress towards OT goals: Progressing toward goals     Plan Discharge plan remains  appropriate    Co-evaluation                 AM-PAC OT "6 Clicks" Daily Activity     Outcome Measure   Help from another person eating meals?: A Little Help from another person taking care of personal grooming?: A Little Help from another person toileting, which includes using toliet, bedpan, or urinal?: A Little Help from another person bathing (including washing, rinsing, drying)?: A Lot Help from another person to put on and taking off regular upper body clothing?: A Lot Help from another person to put on and taking off regular lower body clothing?: A Lot 6 Click Score: 15    End of Session Equipment Utilized During Treatment: Other (comment)(sling)  OT Visit Diagnosis:  Other abnormalities of gait and mobility (R26.89);Muscle weakness (generalized) (M62.81);Unsteadiness on feet (R26.81);Pain Pain - part of body: Shoulder   Activity Tolerance Patient tolerated treatment well   Patient Left in bed;with call bell/phone within reach   Nurse Communication Mobility status        Time: 3818-4037 OT Time Calculation (min): 16 min  Charges: OT General Charges $OT Visit: 1 Visit OT Treatments $Therapeutic Exercise: 8-22 mins  Suzanne [name], [place] [phone] Office- [phone], Edwena Felty D 01/14/2019, 5:38 PM

## 2019-01-14 NOTE — Progress Notes (Signed)
Suzanne [name]   DOB:1960-08-18   ES#:[id number]   UQJ#:[id number]  Subjective:  Suzanne [name] continues to feel better.  Breathing is stable.  O2 is now down to 2 L/min via nasal cannula.  Uses BiPAP at night.  Reports mild discomfort in her throat.  This started following recent intubation. Due to transfer out of the stepdown/ICU later today.  Has been working with PT/OT/ST. anxious to continue to be more mobile.  Objective: morbidly obese [name] woman examined in bed Vitals:   01/14/19 1200 01/14/19 1300  BP: (!) 116/57 126/68  Pulse: 90 98  Resp: (!) 40 (!) 30  Temp:    SpO2: 97% 95%    Body mass index is 32.92 kg/m.  Intake/Output Summary (Last 24 hours) at 01/14/2019 1455 Last data filed at 01/14/2019 0600 Gross per 24 hour  Intake 402.69 ml  Output 2300 ml  Net -1897.31 ml   General: Awake and alert, no distress HEENT: No thrush or mucositis Lungs: Clear to auscultation bilaterally Cardiovascular: Regular rate and rhythm, no murmur, no lower extremity edema Abdomen: Positive bowel sounds, soft, nontender Neuro: Alert and oriented x3  CBG (last 3)  No results for input(s): GLUCAP in the last 72 hours.   Labs:  Lab Results  Component Value Date   WBC 8.0 01/13/2019   HGB 8.9 (L) 01/13/2019   HCT 29.1 (L) 01/13/2019   MCV 106.6 (H) 01/13/2019   PLT 326 01/13/2019   NEUTROABS 5.0 12/25/2018    '@LASTCHEMISTRY' @  Urine Studies No results for input(s): UHGB, CRYS in the last 72 hours.  Invalid input(s): UACOL, UAPR, USPG, UPH, UTP, UGL, UKET, UBIL, UNIT, UROB, [place], UEPI, UWBC, [place], [place], [place], [place], [place]  Basic Metabolic Panel: Recent Labs  Lab 01/10/19 0500 01/11/19 0530 01/12/19 0415 01/13/19 0500 01/14/19 0905  NA 139 139 139 137 136  K 3.8 2.9* 3.9 3.9 2.9*  CL 102 101 100 100 97*  CO2 '28 29 28 29 28  ' GLUCOSE 135* 95 91 101* 109*  BUN '10 10 8 6 11  ' CREATININE 0.38* 0.38* 0.36* 0.34* 0.39*  CALCIUM 7.2* 6.1* 6.3* 5.2* 6.7*   GFR Estimated Creatinine  Clearance: 77.9 mL/min (A) (by C-G formula based on SCr of 0.39 mg/dL (L)). Liver Function Tests: Recent Labs  Lab 01/11/19 1036 01/14/19 0905  AST 15 13*  ALT 8 6  ALKPHOS 213* 201*  BILITOT 0.6 0.6  PROT 6.3* 6.5  ALBUMIN 2.9* 2.7*   No results for input(s): LIPASE, AMYLASE in the last 168 hours. No results for input(s): AMMONIA in the last 168 hours. Coagulation profile No results for input(s): INR, PROTIME in the last 168 hours.  CBC: Recent Labs  Lab 01/09/19 0445 01/09/19 1947 01/10/19 0500 01/11/19 0530 01/12/19 0415 01/13/19 0500  WBC 8.7  --  9.6 9.7 8.5 8.0  HGB 8.2* 8.8* 9.0* 8.9* 8.8* 8.9*  HCT 28.5* 26.0* 29.2* 29.7* 29.7* 29.1*  MCV 113.1*  --  104.3* 106.8* 106.5* 106.6*  PLT 377  --  341 345 314 326   Cardiac Enzymes: No results for input(s): CKTOTAL, CKMB, CKMBINDEX, TROPONINI in the last 168 hours. BNP: Invalid input(s): POCBNP CBG: Recent Labs  Lab 01/10/19 0747 01/10/19 1558  GLUCAP 98 87   D-Dimer No results for input(s): DDIMER in the last 72 hours. Hgb A1c No results for input(s): HGBA1C in the last 72 hours. Lipid Profile No results for input(s): CHOL, HDL, LDLCALC, TRIG, CHOLHDL, LDLDIRECT in the last 72 hours. Thyroid function studies No  results for input(s): TSH, T4TOTAL, T3FREE, THYROIDAB in the last 72 hours.  Invalid input(s): FREET3 Anemia work up No results for input(s): VITAMINB12, FOLATE, FERRITIN, TIBC, IRON, RETICCTPCT in the last 72 hours. Microbiology Recent Results (from the past 240 hour(s))  Surgical pcr screen     Status: Abnormal   Collection Time: 01/09/19  1:49 PM   Specimen: Nasal Mucosa; Nasal Swab  Result Value Ref Range Status   MRSA, PCR NEGATIVE NEGATIVE Final   Staphylococcus aureus POSITIVE (A) NEGATIVE Final    Comment: (NOTE) The Xpert SA Assay (FDA approved for NASAL specimens in patients 59 years of age and older), is one component of a comprehensive surveillance program. It is not intended to  diagnose infection nor to guide or monitor treatment. Performed at [facility], [place] [address] [place], [place] [address]       Studies:  Dg Chest Port 1 View  Result Date: 01/13/2019 CLINICAL DATA:  Aspiration into respiratory tract. Acute on chronic respiratory failure. EXAM: PORTABLE CHEST 1 VIEW COMPARISON:  Multiple prior exams most recently earlier this day. FINDINGS: Patient's chin obscures the apices. Right upper extremity PICC with tip unchanged. Progressive hazy opacity at the lung bases likely pleural effusions. Increasing vascular congestion. Grossly unchanged heart size and mediastinal contours. No pneumothorax. Osseous metastatic disease with recent surgical fixation of the left proximal humerus. IMPRESSION: Progressive hazy opacity at the lung bases likely pleural effusions. Increasing vascular congestion. Electronically Signed   By: Keith Rake M.D.   On: 01/13/2019 23:11   Dg Chest Port 1 View  Result Date: 01/13/2019 CLINICAL DATA:  Atelectasis EXAM: PORTABLE CHEST 1 VIEW COMPARISON:  01/10/2019 FINDINGS: Cardiac shadow is stable. Endotracheal tube is been removed as has a gastric catheter. Right-sided PICC line remains in satisfactory position. Elevation of the right hemidiaphragm is again seen and stable. Bibasilar atelectatic changes are noted increased from the prior study. Postsurgical changes in the left shoulder are noted. IMPRESSION: Interval removal of tubes and lines. Slight increase in bibasilar atelectasis. Electronically Signed   By: Inez Catalina M.D.   On: 01/13/2019 07:20    Assessment: 58 y.o. [place] woman presenting 10/26/2018 with right-sided inflammatory breast cancer, stage IV, involving lungs, lymph nodes and bones, as follows:             (a) chest CT scan 10/26/2018 shows bilateral pleural effusions, possible lymphangitic spread of tumor, diffuse bony metastatic disease, and significant axillary mediastinal and hilar  adenopathy             (b) bone scan 10/27/2018 is a "near [place]" consistent with widespread bony metastatic disease             (c) head CT with and without contrast 10/30/2018 shows no intracranial metastatic disease, multiple calvarial lesions             (d) CA-27-29 on 10/27/2018 was 1810.4  (1) pleural fluid from right thoracentesis 10/28/2018 confirms malignant cells consistent with a breast primary, strongly estrogen and progesterone receptor positive, HER-2 not amplified, with an MIB-1 of 2%  (2) anastrozole started 10/29/2018             (a) palbociclib to start 11/13/2018             (b) denosumab/xgeva to start 11/13/2018  (3) associated problems:             (a) hypoxia secondary to effusions, deconditioning, morbid obesity             (  b) pain from bone lesions             (c) right upper extremity lymphedema             (d) anemia, requiring transfusion             (e) poor venous access  (f) poor social situation  (4) genetics testing pending   Plan:  [place] continues to improve.  We plan to resume her Ibrance 100 mg daily starting on 01/15/2019.  Her daughter has brought her home supply which has been sent to pharmacy.  Continue anastrozole.  For her sore throat, I have encouraged her to use the as needed Chloraseptic or Cepacol that is already ordered.  I did not see any mucositis or evidence of thrush.  Will follow with you.  Mikey Bussing, NP 01/14/2019  2:55 PM Medical Oncology and Hematology [facility] [address] [place], [place] [address] Tel. [phone]    Fax. [phone]

## 2019-01-14 NOTE — Progress Notes (Signed)
Notified provider for a potassium of 2.9.

## 2019-01-14 NOTE — Progress Notes (Signed)
  Speech Language Pathology Treatment: Dysphagia  Patient Details Name: Suzanne [name] MRN: [id number] DOB: 1960/12/02 Today's Date: 01/14/2019 Time: 1129-1201 SLP Time Calculation (min) (ACUTE ONLY): 32 min  Assessment / Plan / Recommendation Clinical Impression  Initiated RMST for pt's swallow/voice and swallow strength; today pt assigns swallow and voice strength at level 8/10 *10 normal; clinically set Respironics Threshold PEP at level 7.5 as SlP does not has access to flow meter, Pt initially required moderate verbal/visual cues to perform adequately fading to mod I.  Level 6 effort reported over 15 repetitions. Educated pt to clinical reasoning for exercise- using teach back.  Goal is effort of 7/10 but given initiating today will monitor progress and adapt as indicated.  Pt reports she had problems tolerating Ensure last night. After intake, it caused her to cough to level of vomiting.  She admitted to sensing mucus retention after consumption but not before.  Recommend continue strict reflux/aspiration precautions.    Will follow up for dysphagia/voice strengthening.  Pt making great progress.    HPI HPI: Pt admitted with acute on chronic respiratory failure and hx of breast CA with mets to bone (currently with L UE in sling 2* pathologic fx) and lungs, chronic diastolic heart failure, anemia and pleural effusion. Pt now s/p LUE ORIF 8/14. She was extubated post op but developed respiratory distress with tachypnea, hypoxia, respiratory acidosis, ?pulmonary edema and was reintubated in PACU. Extubated 8/16. Per PCCM, has had shallow breathing due to elevated right hemidiaphragm and effusions; gradually weaning off BiPAP.Pt has been off of Bipap and tolerating well.  SLP visit to determine readiness for dietary advancement and to educate pt to recommendations.    Pt reports being excessively careful with eating most notably with particulate foods and medicine.Marland Kitchen      SLP Plan  Continue with  current plan of care       Recommendations  Liquids provided via: Cup;Straw Medication Administration: Whole meds with puree Supervision: Patient able to self feed Compensations: Slow rate;Small sips/bites Postural Changes and/or Swallow Maneuvers: Seated upright 90 degrees;Upright 30-60 min after meal                Oral Care Recommendations: Oral care BID SLP Visit Diagnosis: Dysphagia, unspecified (R13.10) Plan: Continue with current plan of care       GO                Macario Golds 01/14/2019, 1:38 PM  Luanna Salk, [place] [facility] SLP Acute Rehab Services Pager [phone] Office [phone]

## 2019-01-14 NOTE — TOC Progression Note (Signed)
Transition of Care [facility]) - Progression Note    Patient Details  Name: Suzanne [name] MRN: [id number] Date of Birth: May 17, 1961  [facility] CM/SW Contact  Inri Sobieski, Marjie Skiff, RN Phone Number: 01/14/2019, 1:33 PM  Clinical Narrative:    This CM spoke with pt at bedside for dc planning. Pt was informed that she has no bed offers yet and I asked if she would agree to me searching outside of [facility]. Pt states that she will go home if she can't get a SNF bed anywhere in [facility]. Per pt daughter, getting into the house would be difficult and she would need an ambulance home. The entrance to the home is about 36inches off the ground. Daughter is working on getting a ramp built but states that won't happen for another couple of weeks. Pt states she has a BSC and RW at home. TOC will continue to follow.        Expected Discharge Plan and Services           Expected Discharge Date: (unknown)                                     Social Determinants of Health (SDOH) Interventions    Readmission Risk Interventions Readmission Risk Prevention Plan 12/01/2018 11/17/2018  Transportation Screening Complete Complete  PCP or Specialist Appt within 3-5 Days - Not Complete  Not Complete comments - will need pcp set up  [place] or [place] - Complete  Social Work Consult for [place] Planning/Counseling - Complete  Palliative Care Screening - Not Complete  Palliative Care Screening Not Complete Comments - pending- bedside RN states plan to consult palliative this admission  Medication Review ([place]) Complete Complete  PCP or Specialist appointment within 3-5 days of discharge Complete -  [place] or Home Care Consult Complete -  SW Recovery Care/Counseling Consult Complete -  Palliative Care Screening Not Applicable -  Skilled Nursing Facility Complete -  Some recent data might be hidden

## 2019-01-14 NOTE — Progress Notes (Addendum)
PROGRESS NOTE  Suzanne [name] OBS:[id number] DOB: Jun 22, 1960 DOA: 12/25/2018 PCP: Patient, No Pcp Per  Brief Narrative: 58 year old with past medical history significant for stage IV breast cancer with known metastasis to bone, lung and lymph nodes and malignant pleural effusion admitted on 12/25/2018 with worsening shortness of breath and altered mental status.  Of note patient was diagnosed with metastatic breast cancer in early June 2020.  At the time of diagnosis she was found to have widespread metastatic disease and pathologic fracture in her right right clavicle.  She subsequently underwent neoadjuvant chemotherapy but has also had her treatment complicated by recurrent malignant right-sided pleural effusion, neutropenia and thrombocytopenia.  In the emergency room patient was noted to be septic, likely secondary to sacral decubital ulcer, blood cultures on admission grew MSSA.  ID was consulted and recommendation was for 4 weeks of Ancef, which finish on 8/27.  Patient was also found to have a pathologic left humeral fracture.  Examination by orthopedic surgery recommended surgery this admission.  Patient underwent  left Humerus fracture open reduction and internal fixation using Bionmet ALPS plate on 4/76.  Post surgery patient develop acute respiratory distress and she was reintubated.  Patient was subsequently extubated on 8/15, she was placed on BiPAP.  Her respiratory status has been stable, on BiPAP at bedtime.  Respiratory failure was thought to be related to pulmonary edema and pleural effusion and chronic elevation of the right hemi-diaphragma. she was a started on IV Lasix .     HPI/Recap of past 24 hours:   She  Developed cough , worsening of sob last night, cxr repeated at midnight, showed progressive hazy opacity at lung bases likely pleural effuisons, increasing vascular congestion, she received extradose of iv lasix last night, 2.3liter urine output last 24hrs  She feels  better this am, she is currently on 2liter, denies chest pain, no sob at rest, she reports bilateral lower extremity edema has resolved  I did not hear cough during encounter  She is on bipap at night, nasal cannula during day time  Am labs not available  Assessment/Plan: Principal Problem:   Acute on chronic respiratory failure with hypoxia (HCC) Active Problems:   Bone metastasis (HCC)   Pleural effusion, bilateral   Carcinoma of breast, estrogen receptor positive, stage 4, right (HCC)   Pressure injury of skin   Pneumonia   Respiratory failure, acute ([place])   Fracture closed, humerus, shaft   Acute on chronic hypoxic respiratory failure, likely  Multifactorial including malignant pleural effusion , acute on chronic diastolic chf:  -Patient presented with worsening hypoxia.  cxr with known bilateral pleural effusions on presentation.  cta on presentation, negative for PE -She has a known right malignant pleural effusion.  Last thoracentesis was 6/20.  Per chart review, there was some concern of possible lymphangitic spread of tumor in the lungs. -She was initially on IV Solu-Medrol which has been discontinued. -Patient had to be re-intubated post surgery. She became hypoxic and respiratory distress.  -She was extubated this morning 8-15, place on BIPAP , appreciate CCM assistance.  -continue Diuresis. Lasix 20 IV BID. -4.7 L today. -CCM recommending BIPAP at HS and PRN during day.  Continue with incentive spirometry, out of bed.   MSSA bacteremia/sepsis; -Sepsis present on admission.  Likely source a stage II/III sacral decubitus ulcer. -ID consultation was obtained.. -Sacral wound did not require any surgical intervention.. -TTE did not show any signs of endocarditis.. -Repeated blood cultures on 7/30 first did not  show any growth -Per ID recommendation, patient is currently on 4 weeks course of Ancef until 8/27 - single lumen PICC line to right arm placed on 8/4  Stage IV  metastatic breast cancer to bone and lung: -Oncology follow up appreciated.. -Patient to continue oral chemotherapy with anastrozole, palbociclib and denosumab/xgeva.   Anemia in the setting of malignance -hgb 8.9   Hypocalcemia: Last dose denosumab on 7/16 She is on lasix Awaiting am lab, replace prn  Addendum: am lab resulted with calcium 6.7 increased from 5.2, corrected calcium today is 7.3  Am labs also showed:  Hypokalemia: Repeat k, repeat in am, check mag  Pathological left femur fractures: Noted on x-ray obtained on 7/25 as an outpatient for shoulder pain. Seen by radiation oncology.  Apparently patient had an appointment with orthopedic on 8/6 which she missed because she was in the hospital.  Seen by Dr. Veverly Fells with orthopedic who recommended surgery this admission. Dr. [place] Pink discussed case with Dr. Jana Hakim and orthopedic doctor and decision was made to proceed with surgery this admission. S/P left Humerus fracture open reduction and internal fixation using Bionmet ALPS plate  Has sling to right arm, follow with ortho  Dr Veverly Fells in two weeks  Elevated d-dimer: likely from active cancer, CT angios chest was negative for PE, Dopplers of lower extremity negative for DVT.  Elevated alkaline phosphatase: Secondary to metastatic bone disease.   Stage II-3 decubitus ulcer sacrum present on admission Local care.  Pressure Injury 12/25/18 Sacrum Mid Deep Tissue Injury - Purple or maroon localized area of discolored intact skin or blood-filled blister due to damage of underlying soft tissue from pressure and/or shear. evolving into stage 2 in patchy areas when asse (Active)  12/25/18 1700  Location: Sacrum  Location Orientation: Mid  Staging: Deep Tissue Injury - Purple or maroon localized area of discolored intact skin or blood-filled blister due to damage of underlying soft tissue from pressure and/or shear.  Wound Description (Comments): evolving into stage 2 in  patchy areas when assessed on 8/2  Present on Admission: Yes     Nutrition Problem: Increased nutrient needs Etiology: chronic illness, cancer and cancer related treatments   FTT: snf placement recommended  DVT Prophylaxis:lovenox   Code Status: full  Family Communication: patient   Disposition Plan: SNF, will obtain covid screening today in preparation for snf placement   Consultants:  Oncology  Critical care  Ortho  Infectious disease  Procedures:  OPEN REDUCTION INTERNAL FIXATION (ORIF) HUMERAL FRACTURE on 8/14  Intubation/extubation  Single lumen picc line placement on 8/4  Antibiotics:  IV Ancef from 7/31---   Objective: BP (!) 147/70    Pulse 98    Temp 97.7 F (36.5 C) (Axillary)    Resp (!) 24    Ht 5\' 3"  (1.6 m)    Wt 84.3 kg    LMP 05/28/2013 (Within Months)    SpO2 100%    BMI 32.92 kg/m   Intake/Output Summary (Last 24 hours) at 01/14/2019 0810 Last data filed at 01/14/2019 0600 Gross per 24 hour  Intake 402.69 ml  Output 2300 ml  Net -1897.31 ml   Filed Weights   01/09/19 0603 01/11/19 0607 01/13/19 0500  Weight: 81.2 kg 82.9 kg 84.3 kg    Exam: Patient is examined daily including today on 01/14/2019, exams remain the same as of yesterday except that has changed    General:  Frail, NAD, left arm in sling, picc line right arm  Cardiovascular: RRR  Respiratory:  diminished at basis, no wheezing , no rales  Abdomen: Soft/ND/NT, positive BS  Musculoskeletal: No Edema  Neuro: alert, oriented   Data Reviewed: Basic Metabolic Panel: Recent Labs  Lab 01/09/19 0445 01/09/19 1947 01/10/19 0500 01/11/19 0530 01/12/19 0415 01/13/19 0500  NA 139 137 139 139 139 137  K 3.8 3.7 3.8 2.9* 3.9 3.9  CL 104  --  102 101 100 100  CO2 30  --  28 29 28 29   GLUCOSE 105* 101* 135* 95 91 101*  BUN 9  --  10 10 8 6   CREATININE 0.38*  --  0.38* 0.38* 0.36* 0.34*  CALCIUM 7.8*  --  7.2* 6.1* 6.3* 5.2*   Liver Function Tests: Recent Labs    Lab 01/07/19 1014 01/11/19 1036  AST 18 15  ALT 11 8  ALKPHOS 227* 213*  BILITOT 0.4 0.6  PROT 5.9* 6.3*  ALBUMIN 2.6* 2.9*   No results for input(s): LIPASE, AMYLASE in the last 168 hours. No results for input(s): AMMONIA in the last 168 hours. CBC: Recent Labs  Lab 01/09/19 0445 01/09/19 1947 01/10/19 0500 01/11/19 0530 01/12/19 0415 01/13/19 0500  WBC 8.7  --  9.6 9.7 8.5 8.0  HGB 8.2* 8.8* 9.0* 8.9* 8.8* 8.9*  HCT 28.5* 26.0* 29.2* 29.7* 29.7* 29.1*  MCV 113.1*  --  104.3* 106.8* 106.5* 106.6*  PLT 377  --  341 345 314 326   Cardiac Enzymes:   No results for input(s): CKTOTAL, CKMB, CKMBINDEX, TROPONINI in the last 168 hours. BNP (last 3 results) Recent Labs    10/26/18 1921 11/14/18 1621 12/25/18 1009  BNP 48.6 37.5 89.0    ProBNP (last 3 results) No results for input(s): PROBNP in the last 8760 hours.  CBG: Recent Labs  Lab 01/10/19 0747 01/10/19 1558  GLUCAP 98 87    Recent Results (from the past 240 hour(s))  Surgical pcr screen     Status: Abnormal   Collection Time: 01/09/19  1:49 PM   Specimen: Nasal Mucosa; Nasal Swab  Result Value Ref Range Status   MRSA, PCR NEGATIVE NEGATIVE Final   Staphylococcus aureus POSITIVE (A) NEGATIVE Final    Comment: (NOTE) The Xpert SA Assay (FDA approved for NASAL specimens in patients 40 years of age and older), is one component of a comprehensive surveillance program. It is not intended to diagnose infection nor to guide or monitor treatment. Performed at [facility], [place] [address], [place], [place] [address]      Studies: Dg Chest Port 1 View  Result Date: 01/13/2019 CLINICAL DATA:  Aspiration into respiratory tract. Acute on chronic respiratory failure. EXAM: PORTABLE CHEST 1 VIEW COMPARISON:  Multiple prior exams most recently earlier this day. FINDINGS: Patient's chin obscures the apices. Right upper extremity PICC with tip unchanged. Progressive hazy opacity at the lung bases  likely pleural effusions. Increasing vascular congestion. Grossly unchanged heart size and mediastinal contours. No pneumothorax. Osseous metastatic disease with recent surgical fixation of the left proximal humerus. IMPRESSION: Progressive hazy opacity at the lung bases likely pleural effusions. Increasing vascular congestion. Electronically Signed   By: Keith Rake M.D.   On: 01/13/2019 23:11    Scheduled Meds:  anastrozole  1 mg Oral Daily   carvedilol  6.25 mg Oral BID WC   chlorhexidine  15 mL Mouth Rinse BID   Chlorhexidine Gluconate Cloth  6 each Topical Daily   docusate sodium  100 mg Oral BID   enoxaparin (LOVENOX) injection  40 mg  Subcutaneous Q24H   feeding supplement  1 Container Oral TID BM   feeding supplement (ENSURE ENLIVE)  237 mL Oral BID BM   furosemide  20 mg Intravenous BID   LORazepam  1 mg Intravenous Once   mouth rinse  15 mL Mouth Rinse q12n4p   multivitamin with minerals  1 tablet Oral Daily   nystatin   Topical TID   [START ON 01/15/2019] palbociclib  100 mg Oral Daily   pantoprazole (PROTONIX) IV  40 mg Intravenous Daily   senna  1 tablet Oral Daily   sodium chloride flush  3 mL Intravenous Q12H    Continuous Infusions:  sodium chloride 250 mL (01/12/19 1304)   sodium chloride Stopped (01/13/19 2144)    ceFAZolin (ANCEF) IV Stopped (01/14/19 0716)     Time spent: 43mins I have personally reviewed and interpreted on  01/14/2019 daily labs, tele strips, imagings as discussed above under date review session and assessment and plans.  I reviewed all nursing notes, pharmacy notes, consultant notes,  vitals, pertinent old records  I have discussed plan of care as described above with RN , patient and  on 01/14/2019   Florencia Reasons MD, PhD, FACP  [facility] Pager [phone]. If 7PM-7AM, please contact night-coverage at [email or web address], password [facility] 01/14/2019, 8:10 AM  LOS: 20 days

## 2019-01-14 NOTE — Telephone Encounter (Signed)
This RN spoke with pt's daughter, Suzanne [name], per VM left with pharmacist and concerns with restarting Ibrance.  Suzanne [name] states " mom is calling me telling me how scared she is about restarting the Ibrance while still on the antibiotic "  This RN informed Suzanne [name] status of lungs regarding met cancer as well as current pneumonia. Need to treat both for best outcome - if Ibrance not restarted pt could have regrowth in lungs.  Suzanne [name] stated " she knows that but she is very scared and anxious and needs someone to explain this to her "  During conversation Suzanne [name] became very emotional with crying causing interference in her ability to complete sentences.  This RN inquired with Suzanne [name] concern for her well being- with Suzanne [name] stating " just people asking a lot of questions that I don't have the answer for and my mom's birthday is tomorrow and I am trying to get things ready for that "  " when I get upset my nervous system just makes me start crying "  This RN validated Suzanne [name]'s statement and emotions Suzanne [name] has known CP ) as well as inquired what questions are being asked of her that is concerning.  1. Issue with Ibrance 2. Case manager called and asked if ok to place [place] - which Suzanne [name] states her mother doesn't want to do because people would not be able to see her as much. 3. " the guy who was to build the ramp and actually start today stated he has to come out and make more measurements to see if they would really be able to do it " " if we don't have a ramp- I don't know how my mom can ever come [name] " ( regarding pt's physical condition ).  This RN informed her Dr Jannifer Rodney will discuss Suzanne [name] concerns with pt in AM.  Suzanne [name] agrees that her mother needs SNF for support for best outcome at this time but concern is where she will be placed.  Lastly regarding the ramp- presently will need to see if current " ramp guy " can build per protocol after getting  additional measurements - Suzanne [name] understands regulations related to inline of ramp that has to be followed.  This RN also let Suzanne [name] know that some of what is being asked of her is not her sole responsibility and to write down concerns so she can let this office know what is occurring for appropriate resources or assistance.  This RN again validated her care of her mother but there are medically issues that is not in the control of Suzanne [name]- even if her mother becomes very anxious and states these concerns to her wanting Suzanne [name] to help- there is little Suzanne [name] can do to change the outcome.   This RN again reiterated for Suzanne [name] to write down issues daily and if they are out of her control or ability to contact this office for support.  Per end of conversation - Suzanne [name] verbalized understanding of support and to call for further issues.

## 2019-01-14 NOTE — Progress Notes (Signed)
Physical Therapy Treatment Patient Details Name: Suzanne [name] MRN: [id number] DOB: [birth year] Today's Date: 01/14/2019    History of Present Illness Pt admitted with acute on chronic respiratory failure and hx of breast CA with mets to bone (currently with L UE in sling 2* pathologic fx) and lungs, chronic diastolic heart failure, anemia and pleural effusion. Pt now s/p LUE ORIF 8/14 and off vent 8/16    PT Comments    General Comments: Pt crying upon entering room having a "moment".  Pt feeling wet and uncomfortable Assisted OOB.  General bed mobility comments: mod A for trunk and  bil LEs. Pt needed assist to scoot hips with bed pad. Increased time.  Once upright, able to static sit Indep EOB x 4 min   RR increased to high 30"s.  General transfer comment: Assist to power up, steady, control descent. VCs safety, technique. Increased time. General Gait Details: Assist to steady pt throughout short distance. Remained on 2 lts [place] O2 sats >94%. Pt fatigues easily. Dyspnea 3/4.  Third assist RN following with recliner. Positioned in recliner with multiple pillows.     Follow Up Recommendations   SNF  CIR ?   Equipment Recommendations       Recommendations for Other Services       Precautions / Restrictions Precautions Precautions: Fall;Shoulder Type of Shoulder Precautions: Elbow, wrist, hand ROM ok; Pendulums and scap retraction ok; No AROM/PROM to shoulder Shoulder Interventions: Shoulder sling/immobilizer Required Braces or Orthoses: Sling Restrictions Weight Bearing Restrictions: Yes LUE Weight Bearing: Non weight bearing Other Position/Activity Restrictions: L UE    Mobility  Bed Mobility Overal bed mobility: Needs Assistance Bed Mobility: Supine to Sit     Supine to sit: Mod assist;HOB elevated     General bed mobility comments: mod A for trunk and  bil LEs. Pt needed assist to scoot hips with bed pad. Increased time.  Once upright, able to static sit Indep EOB x 4  min   RR increased to high 30"s  Transfers Overall transfer level: Needs assistance Equipment used: None Transfers: Sit to/from Stand Sit to Stand: Min assist;+2 safety/equipment         General transfer comment: Assist to power up, steady, control descent. VCs safety, technique. Increased time.  Ambulation/Gait Ambulation/Gait assistance: Min assist;+2 safety/equipment Gait Distance (Feet): 18 Feet(12 then another 6 feet) Assistive device: 2 person hand held assist Gait Pattern/deviations: Step-to pattern;Trunk flexed;Decreased step length - left;Decreased step length - right Gait velocity: decreased   General Gait Details: Assist to steady pt throughout short distance. Remained on 2 lts [place] O2 sats >94%. Pt fatigues easily. Dyspnea 3/4.  Third assist RN following with recliner.   Stairs             Wheelchair Mobility    Modified Rankin (Stroke Patients Only)       Balance                                            Cognition Arousal/Alertness: Awake/alert Behavior During Therapy: WFL for tasks assessed/performed Overall Cognitive Status: Within Functional Limits for tasks assessed                                 General Comments: Pt crying upone entering room having a "moment".  Pt feeling wet  and uncomfortable      Exercises      General Comments        Pertinent Vitals/Pain Pain Assessment: Faces Pain Score: 5  Faces Pain Scale: Hurts a little bit Pain Location: L UE Pain Descriptors / Indicators: Discomfort;Grimacing Pain Intervention(s): Monitored during session;Repositioned    Home Living                      Prior Function            PT Goals (current goals can now be found in the care plan section) Progress towards PT goals: Progressing toward goals    Frequency           PT Plan      Co-evaluation              AM-PAC PT "6 Clicks" Mobility   Outcome Measure                    End of Session Equipment Utilized During Treatment: Gait belt;Oxygen Activity Tolerance: Patient limited by fatigue           Time: 1413-1440 PT Time Calculation (min) (ACUTE ONLY): 27 min  Charges:  $Gait Training: 8-22 mins $Therapeutic Activity: 8-22 mins                     Rica Koyanagi  PTA Acute  Rehabilitation Services Pager      [phone] Office      [phone]

## 2019-01-15 ENCOUNTER — Telehealth: Payer: Self-pay | Admitting: Family Medicine

## 2019-01-15 ENCOUNTER — Encounter: Payer: Self-pay | Admitting: General Practice

## 2019-01-15 DIAGNOSIS — E876 Hypokalemia: Secondary | ICD-10-CM

## 2019-01-15 LAB — MAGNESIUM: Magnesium: 1.9 mg/dL (ref 1.7–2.4)

## 2019-01-15 LAB — URINALYSIS, ROUTINE W REFLEX MICROSCOPIC
Bilirubin Urine: NEGATIVE
Glucose, UA: NEGATIVE mg/dL
Hgb urine dipstick: NEGATIVE
Ketones, ur: NEGATIVE mg/dL
Nitrite: NEGATIVE
Protein, ur: NEGATIVE mg/dL
Specific Gravity, Urine: 1.008 (ref 1.005–1.030)
pH: 6 (ref 5.0–8.0)

## 2019-01-15 LAB — BASIC METABOLIC PANEL
Anion gap: 8 (ref 5–15)
BUN: 12 mg/dL (ref 6–20)
CO2: 28 mmol/L (ref 22–32)
Calcium: 6.5 mg/dL — ABNORMAL LOW (ref 8.9–10.3)
Chloride: 100 mmol/L (ref 98–111)
Creatinine, Ser: 0.4 mg/dL — ABNORMAL LOW (ref 0.44–1.00)
GFR calc Af Amer: >60 mL/min (ref >60)
GFR calc non Af Amer: >60 mL/min (ref >60)
Glucose, Bld: 116 mg/dL — ABNORMAL HIGH (ref 70–99)
Potassium: 3.2 mmol/L — ABNORMAL LOW (ref 3.5–5.1)
Sodium: 136 mmol/L (ref 135–145)

## 2019-01-15 LAB — CALCIUM, IONIZED: Calcium, Ionized, Serum: 3.8 mg/dL — ABNORMAL LOW (ref 4.5–5.6)

## 2019-01-15 MED ORDER — POTASSIUM CHLORIDE 20 MEQ/15ML (10%) PO SOLN
40.0000 meq | ORAL | Status: AC
  Administered 2019-01-15 (×2): 40 meq via ORAL
  Filled 2019-01-15 (×2): qty 30

## 2019-01-15 NOTE — Progress Notes (Signed)
Suzanne [name]   DOB:1961/04/26   IR#:[id number]   JKK#:[id number]  Subjective:  Suzanne [name] remains in the stepdown/ICU due to lack of bed availability on MedSurg floor.  Breathing remains stable.  She states that she was able to ambulate in the hallway with therapy yesterday.  She is due to begin [place] today, but the patient is anxious about starting this medication while she remains on IV antibiotics.  Objective: morbidly obese White woman examined in bed Vitals:   01/15/19 0700 01/15/19 0800  BP:  (!) 141/62  Pulse:  85  Resp:  20  Temp: 97.6 F (36.4 C)   SpO2:  100%    Body mass index is 30.93 kg/m.  Intake/Output Summary (Last 24 hours) at 01/15/2019 1116 Last data filed at 01/15/2019 1000 Gross per 24 hour  Intake 854.77 ml  Output 1350 ml  Net -495.23 ml   General: Awake and alert, no distress HEENT: No thrush or mucositis Lungs: Clear to auscultation bilaterally Cardiovascular: Regular rate and rhythm, no murmur, no lower extremity edema Abdomen: Positive bowel sounds, soft, nontender Neuro: Alert and oriented x3  CBG (last 3)  No results for input(s): GLUCAP in the last 72 hours.   Labs:  Lab Results  Component Value Date   WBC 8.0 01/13/2019   HGB 8.9 (L) 01/13/2019   HCT 29.1 (L) 01/13/2019   MCV 106.6 (H) 01/13/2019   PLT 326 01/13/2019   NEUTROABS 5.0 12/25/2018    _0 @  Urine Studies No results for input(s): UHGB, CRYS in the last 72 hours.  Invalid input(s): UACOL, UAPR, USPG, UPH, UTP, UGL, UKET, UBIL, UNIT, UROB, [place], UEPI, UWBC, [place], [place], [place], [place], [place]  Basic Metabolic Panel: Recent Labs  Lab 01/11/19 0530 01/12/19 0415 01/13/19 0500 01/14/19 0905 01/15/19 0500  NA 139 139 137 136 136  K 2.9* 3.9 3.9 2.9* 3.2*  CL 101 100 100 97* 100  CO2 _1 GLUCOSE 95 91 101* 109* 116*  BUN _2 CREATININE 0.38* 0.36* 0.34* 0.39* 0.40*  CALCIUM 6.1* 6.3* 5.2* 6.7* 6.5*  MG  --   --   --   --  1.9    GFR Estimated Creatinine Clearance: 74.5 mL/min (A) (by C-G formula based on SCr of 0.4 mg/dL (L)). Liver Function Tests: Recent Labs  Lab 01/11/19 1036 01/14/19 0905  AST 15 13*  ALT 8 6  ALKPHOS 213* 201*  BILITOT 0.6 0.6  PROT 6.3* 6.5  ALBUMIN 2.9* 2.7*   No results for input(s): LIPASE, AMYLASE in the last 168 hours. No results for input(s): AMMONIA in the last 168 hours. Coagulation profile No results for input(s): INR, PROTIME in the last 168 hours.  CBC: Recent Labs  Lab 01/09/19 0445 01/09/19 1947 01/10/19 0500 01/11/19 0530 01/12/19 0415 01/13/19 0500  WBC 8.7  --  9.6 9.7 8.5 8.0  HGB 8.2* 8.8* 9.0* 8.9* 8.8* 8.9*  HCT 28.5* 26.0* 29.2* 29.7* 29.7* 29.1*  MCV 113.1*  --  104.3* 106.8* 106.5* 106.6*  PLT 377  --  341 345 314 326   Cardiac Enzymes: No results for input(s): CKTOTAL, CKMB, CKMBINDEX, TROPONINI in the last 168 hours. BNP: Invalid input(s): POCBNP CBG: Recent Labs  Lab 01/10/19 0747 01/10/19 1558  GLUCAP 98 87   D-Dimer No results for input(s): DDIMER in the last 72 hours. Hgb A1c No results for input(s): HGBA1C in the last 72 hours. Lipid Profile No results for input(s): CHOL, HDL, LDLCALC,  TRIG, CHOLHDL, LDLDIRECT in the last 72 hours. Thyroid function studies No results for input(s): TSH, T4TOTAL, T3FREE, THYROIDAB in the last 72 hours.  Invalid input(s): FREET3 Anemia work up No results for input(s): VITAMINB12, FOLATE, FERRITIN, TIBC, IRON, RETICCTPCT in the last 72 hours. Microbiology Recent Results (from the past 240 hour(s))  Surgical pcr screen     Status: Abnormal   Collection Time: 01/09/19  1:49 PM   Specimen: Nasal Mucosa; Nasal Swab  Result Value Ref Range Status   MRSA, PCR NEGATIVE NEGATIVE Final   Staphylococcus aureus POSITIVE (A) NEGATIVE Final    Comment: (NOTE) The Xpert SA Assay (FDA approved for NASAL specimens in patients 48 years of age and older), is one component of a comprehensive surveillance  program. It is not intended to diagnose infection nor to guide or monitor treatment. Performed at [facility], [place] [address] [place], [place] [address]       Studies:  Dg Chest Port 1 View  Result Date: 01/13/2019 CLINICAL DATA:  Aspiration into respiratory tract. Acute on chronic respiratory failure. EXAM: PORTABLE CHEST 1 VIEW COMPARISON:  Multiple prior exams most recently earlier this day. FINDINGS: Patient's chin obscures the apices. Right upper extremity PICC with tip unchanged. Progressive hazy opacity at the lung bases likely pleural effusions. Increasing vascular congestion. Grossly unchanged heart size and mediastinal contours. No pneumothorax. Osseous metastatic disease with recent surgical fixation of the left proximal humerus. IMPRESSION: Progressive hazy opacity at the lung bases likely pleural effusions. Increasing vascular congestion. Electronically Signed   By: Keith Rake M.D.   On: 01/13/2019 23:11    Assessment: 58 y.o. [place] woman presenting 10/26/2018 with right-sided inflammatory breast cancer, stage IV, involving lungs, lymph nodes and bones, as follows:             (a) chest CT scan 10/26/2018 shows bilateral pleural effusions, possible lymphangitic spread of tumor, diffuse bony metastatic disease, and significant axillary mediastinal and hilar adenopathy             (b) bone scan 10/27/2018 is a "[place]" consistent with widespread bony metastatic disease             (c) head CT with and without contrast 10/30/2018 shows no intracranial metastatic disease, multiple calvarial lesions             (d) CA-27-29 on 10/27/2018 was 1810.4  (1) pleural fluid from right thoracentesis 10/28/2018 confirms malignant cells consistent with a breast primary, strongly estrogen and progesterone receptor positive, HER-2 not amplified, with an MIB-1 of 2%  (2) anastrozole started 10/29/2018             (a) palbociclib to start 11/13/2018              (b) denosumab/xgeva to start 11/13/2018  (3) associated problems:             (a) hypoxia secondary to effusions, deconditioning, morbid obesity             (b) pain from bone lesions             (c) right upper extremity lymphedema             (d) anemia, requiring transfusion             (e) poor venous access  (f) poor social situation  (4) genetics testing pending   Plan:  Suzanne [name] continues to improve.  Recommend for her to continue anastrozole.  Anxious about resuming [name]  while on IV antibiotics.  Accordingly, I have discontinued the Ibrance from her orders.  We will plan to resume this once IV antibiotics have been completed.  Will follow with you.  Mikey Bussing, NP 01/15/2019  11:16 AM Medical Oncology and Hematology [facility] [address] [place], [place] [address] Tel. [phone]    Fax. [phone]

## 2019-01-15 NOTE — Progress Notes (Signed)
Pt. Put on Bipap at 2300 and took off at 0600.

## 2019-01-15 NOTE — Progress Notes (Signed)
  Speech Language Pathology Treatment: Dysphagia  Patient Details Name: Suzanne [name] MRN: [id number] DOB: Apr 02, 1961 Today's Date: 01/15/2019 Time: 7262-0355 SLP Time Calculation (min) (ACUTE ONLY): 16 min  Assessment / Plan / Recommendation Clinical Impression  Today it Ms Hasley's birthday!!!  She is fatigued and presenting with excessive coughing but denies issues with reflux.  She admits to productive cough x1 today to viscous mucus.    Pt questioning performance with RMST stating "I'm doing something wrong".  Given pt is hearing impaired, she can not "hear" the puff of air that is expelled from device when acceptable amount of water pressure applied to open the spring.  SLP had pt demonstrate use x1 - then adapated to increase difficuly to allow her to feel severe resistance.  She then demonstrated correct use on level 7.5 cmH20 pressure. Unfortunately pt was excessively coughing during the session which prohibited her from performing exercise.  She demonstrated adequate performance of exercise and questions were answered re: exercise regimen.    Provided pt with 1/2 cup diet Ginger-ale and 1/2 cup Boost Breeze to determine if would be more palatable for her. She desires the Ensure instead.  Demonstrated no difficulties swallowing during the session.    Will continue to follow up next week to help pt maximize her voice and swallow strength s/p intubation.  Thanks for allowing me to help with this most pleasant pt.    HPI HPI: Pt admitted with acute on chronic respiratory failure and hx of breast CA with mets to bone (currently with L UE in sling 2* pathologic fx) and lungs, chronic diastolic heart failure, anemia and pleural effusion. Pt now s/p LUE ORIF 8/14. She was extubated post op but developed respiratory distress with tachypnea, hypoxia, respiratory acidosis, ?pulmonary edema and was reintubated in PACU. Extubated 8/16. Per PCCM, has had shallow breathing due to elevated right  hemidiaphragm and effusions; gradually weaning off BiPAP.Pt has been off of Bipap and tolerating well.  SLP visit to determine readiness for dietary advancement and to educate pt to recommendations.    Pt reports being excessively careful with eating most notably with particulate foods and medicine.Marland Kitchen      SLP Plan  Continue with current plan of care       Recommendations  Liquids provided via: Cup;Straw Medication Administration: Whole meds with puree Supervision: Patient able to self feed Compensations: Slow rate;Small sips/bites Postural Changes and/or Swallow Maneuvers: Seated upright 90 degrees;Upright 30-60 min after meal                Oral Care Recommendations: Oral care BID Follow up Recommendations: Skilled Nursing facility SLP Visit Diagnosis: Dysphagia, unspecified (R13.10) Plan: Continue with current plan of care       [place], [place] [facility] SLP [place] Pager [phone] Office [place], [place] 01/15/2019, 5:25 PM

## 2019-01-15 NOTE — Progress Notes (Signed)
Inpatient Rehabilitation-Admissions Coordinator   Consult received. After chart review it appears the patient will need longer, slower paced rehab program. Doubt she would be able to tolerate the intensity of CIR. Also noted home modifications may need time to be completed. Feel SNF setting is most appropriate.   AC will sign off.   Please call if questions.   Jhonnie Garner, OTR/L  Rehab Admissions Coordinator  [phone] 01/15/2019 11:30 AM

## 2019-01-15 NOTE — Progress Notes (Signed)
Occupational Therapy Treatment Patient Details Name: Suzanne [name] MRN: [id number] DOB: 1960/10/28 Today's Date: 01/15/2019    History of present illness Pt admitted with acute on chronic respiratory failure and hx of breast CA with mets to bone (currently with L UE in sling 2* pathologic fx) and lungs, chronic diastolic heart failure, anemia and pleural effusion. Pt now s/p LUE ORIF 8/14 and off vent 8/16   OT comments  Pt back in bed and did agree to LUE elbow, wrist and hand ROM.  Pts skin very dry on LUE.  Lotion applied.  Encouraged pt to apply lotion to LUE with RUE.  This fatigued pt.  Left sling off so lotion could dry.  RN aware.   Follow Up Recommendations  SNF    Equipment Recommendations  None recommended by OT    Recommendations for Other Services      Precautions / Restrictions Precautions Precautions: Fall;Shoulder Type of Shoulder Precautions: Elbow, wrist, hand ROM ok; Pendulums and scap retraction ok; No AROM/PROM to shoulder Shoulder Interventions: Shoulder sling/immobilizer Required Braces or Orthoses: Sling Restrictions Weight Bearing Restrictions: Yes LUE Weight Bearing: Non weight bearing Other Position/Activity Restrictions: L UE              ADL either performed or assessed with clinical judgement        Vision Patient Visual Report: No change from baseline            Cognition Arousal/Alertness: Awake/alert Behavior During Therapy: WFL for tasks assessed/performed Overall Cognitive Status: Within Functional Limits for tasks assessed                                          Exercises Shoulder Exercises Elbow Flexion: AROM;AAROM;Left(able to tolerate x2) Elbow Extension: AROM;AAROM(able to tolerate x2) Wrist Flexion: AROM;10 reps Wrist Extension: AROM;10 reps Digit Composite Flexion: AROM;10 reps Composite Extension: AROM;10 reps  Scapular retraction- 10 reps   Shoulder Instructions Shoulder Instructions ROM for  elbow, wrist and digits of operated UE: Minimal assistance(pt in bed and exhausted this afternoon.)     General Comments      Pertinent Vitals/ Pain       Pain Score: 2  Pain Location: L UE with elbow ROM Pain Descriptors / Indicators: Sore         Frequency  Min 2X/week        Progress Toward Goals  OT Goals(current goals can now be found in the care plan section)  Progress towards OT goals: Progressing toward goals     Plan Discharge plan remains appropriate    Co-evaluation                 AM-PAC OT "6 Clicks" Daily Activity     Outcome Measure   Help from another person eating meals?: A Little Help from another person taking care of personal grooming?: A Little Help from another person toileting, which includes using toliet, bedpan, or urinal?: A Little Help from another person bathing (including washing, rinsing, drying)?: A Lot Help from another person to put on and taking off regular upper body clothing?: A Lot Help from another person to put on and taking off regular lower body clothing?: A Lot 6 Click Score: 15    End of Session Equipment Utilized During Treatment: Other (comment)(sling)  OT Visit Diagnosis: Other abnormalities of gait and mobility (R26.89);Muscle weakness (generalized) (M62.81);Unsteadiness on feet (R26.81);Pain  Pain - part of body: Shoulder   Activity Tolerance Patient tolerated treatment well   Patient Left in bed;with call bell/phone within reach   Nurse Communication Mobility status        Time: 1287-8676 OT Time Calculation (min): 14 min  Charges: OT General Charges $OT Visit: 1 Visit OT Treatments $Therapeutic Exercise: 8-22 mins  Kari Baars, [place] [phone] Office- [place], [place] 01/15/2019, 4:05 PM

## 2019-01-15 NOTE — Progress Notes (Signed)
PROGRESS NOTE  Suzanne [name] OVZ:[id number] DOB: 1961-05-06 DOA: 12/25/2018 PCP: Patient, No Pcp Per  Brief Narrative: 58 year old with past medical history significant for stage IV breast cancer with known metastasis to bone, lung and lymph nodes and malignant pleural effusion admitted on 12/25/2018 with worsening shortness of breath and altered mental status.  Of note patient was diagnosed with metastatic breast cancer in early June 2020.  At the time of diagnosis she was found to have widespread metastatic disease and pathologic fracture in her right right clavicle.  She subsequently underwent neoadjuvant chemotherapy but has also had her treatment complicated by recurrent malignant right-sided pleural effusion, neutropenia and thrombocytopenia.  In the emergency room patient was noted to be septic, likely secondary to sacral decubital ulcer, blood cultures on admission grew MSSA.  ID was consulted and recommendation was for 4 weeks of Ancef, which finish on 8/27.  Patient was also found to have a pathologic left humeral fracture.  Examination by orthopedic surgery recommended surgery this admission.  Patient underwent  left Humerus fracture open reduction and internal fixation using Bionmet ALPS plate on 4/12.  Post surgery patient develop acute respiratory distress and she was reintubated.  Patient was subsequently extubated on 8/15, she was placed on BiPAP.  Her respiratory status has been stable, on BiPAP at bedtime.  Respiratory failure was thought to be related to pulmonary edema and pleural effusion and chronic elevation of the right hemi-diaphragma. she was a started on IV Lasix .     HPI/Recap of past 24 hours:  Uneventful night, urine output not accurately documented, she is awaiting to be transferred to med surg floor   She feels better this am, she is currently on 2liter, denies chest pain, no sob at rest, she reports bilateral lower extremity edema has resolved  I did not  hear cough during encounter  She is on bipap at night, nasal cannula during day time    Assessment/Plan: Principal Problem:   Acute on chronic respiratory failure with hypoxia ([place]) Active Problems:   Bone metastasis (HCC)   Pleural effusion, bilateral   Carcinoma of breast, estrogen receptor positive, stage 4, right (HCC)   Pressure injury of skin   Pneumonia   Respiratory failure, acute ([place])   Fracture closed, humerus, shaft   Acute on chronic diastolic CHF (congestive heart failure) (HCC)   Acute on chronic hypoxic respiratory failure, likely  Multifactorial including malignant pleural effusion , acute on chronic diastolic chf:  -Patient presented with worsening hypoxia.  cxr with known bilateral pleural effusions on presentation.  cta on presentation, negative for PE -She has a known right malignant pleural effusion.  Last thoracentesis was 6/20.  Per chart review, there was some concern of possible lymphangitic spread of tumor in the lungs. -She was initially on IV Solu-Medrol which has been discontinued. -Patient had to be re-intubated post surgery. She became hypoxic and respiratory distress.  -She was extubated this morning 8-15, place on BIPAP , appreciate CCM assistance.  -continue Diuresis as long as bp and cr allows. Lasix 20 IV BID. -5.2 L today. She reports had bilateral lower extremity edema for a month prior to hospitalization -CCM recommending BIPAP at HS and PRN during day.  Continue with incentive spirometry, out of bed.   MSSA bacteremia/sepsis; -Sepsis present on admission.  Likely source a stage II/III sacral decubitus ulcer. -ID consultation was obtained.. -Sacral wound did not require any surgical intervention.. -TTE did not show any signs of endocarditis.. -Repeated blood cultures  on 7/30 first did not show any growth -Per ID recommendation, patient is currently on 4 weeks course of Ancef until 8/27 - single lumen PICC line to right arm placed on 8/4   Stage IV metastatic breast cancer to bone and lung: -Oncology follow up appreciated.. -Patient to continue oral chemotherapy with anastrozole, palbociclib and denosumab/xgeva.   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 Seen by radiation oncology.  Apparently patient had an appointment with orthopedic on 8/6 which she missed because she was in the hospital.  Seen by Dr. Veverly Fells with orthopedic who recommended surgery this admission. Dr. Maryland Pink discussed case with Dr. Jana Hakim and orthopedic doctor and decision was made to proceed with surgery this admission. S/P left Humerus fracture open reduction and internal fixation using Bionmet ALPS plate  Has sling to right arm, follow with ortho  Dr Veverly Fells in two weeks  Elevated d-dimer: likely from active cancer, CT angios chest was negative for PE, Dopplers of lower extremity negative for DVT.  Elevated alkaline phosphatase: Secondary to metastatic bone disease.   Stage II-3 decubitus ulcer sacrum present on admission Local care.  Pressure Injury 12/25/18 Sacrum Mid Deep Tissue Injury - Purple or maroon localized area of discolored intact skin or blood-filled blister due to damage of underlying soft tissue from pressure and/or shear. evolving into stage 2 in patchy areas when asse (Active)  12/25/18 1700  Location: Sacrum  Location Orientation: Mid  Staging: Deep Tissue Injury - Purple or maroon localized area of discolored intact skin or blood-filled blister due to damage of underlying soft tissue from pressure and/or shear.  Wound Description (Comments): evolving into stage 2 in patchy areas when  assessed on 8/2  Present on Admission: Yes     Nutrition Problem: Increased nutrient needs Etiology: chronic illness, cancer and cancer related treatments   FTT: she is declined by CIR, snf placement recommended  DVT Prophylaxis:lovenox   Code Status: full  Family Communication: patient   Disposition Plan: SNF, covid screening  For snf placement obtained on 8/19 Case mna   Consultants:  Oncology  Critical care  Ortho  Infectious disease  Procedures:  OPEN REDUCTION INTERNAL FIXATION (ORIF) HUMERAL FRACTURE on 8/14  Intubation/extubation  Single lumen picc line placement on 8/4  Antibiotics:  IV Ancef from 7/31---   Objective: BP 132/65 (BP Location: Left Leg)   Pulse 96   Temp 98 F (36.7 C) (Axillary)   Resp 18   Ht 5\' 3"  (1.6 m)   Wt 79.2 kg   LMP 05/28/2013 (Within Months)   SpO2 97%   BMI 30.93 kg/m   Intake/Output Summary (Last 24 hours) at 01/15/2019 0801 Last data filed at 01/15/2019 0618 Gross per 24 hour  Intake 279.79 ml  Output 200 ml  Net 79.79 ml   Filed Weights   01/11/19 0607 01/13/19 0500 01/15/19 0500  Weight: 82.9 kg 84.3 kg 79.2 kg    Exam: Patient is examined daily including today on 01/15/2019, exams remain the same as of yesterday except that has changed    General:  Frail, NAD, left arm in sling, picc line right arm  Cardiovascular: RRR  Respiratory: diminished at basis, no wheezing , no rales  Abdomen: Soft/ND/NT, positive BS  Musculoskeletal: No Edema  Neuro: alert, oriented   Data Reviewed: Basic Metabolic Panel: Recent Labs  Lab 01/11/19 0530 01/12/19 0415 01/13/19 0500 01/14/19 0905 01/15/19 0500  NA 139 139 137 136 136  K 2.9* 3.9 3.9 2.9* 3.2*  CL 101 100 100 97* 100  CO2 29 28 29 28 28   GLUCOSE 95 91 101* 109* 116*  BUN 10 8 6 11 12   CREATININE 0.38* 0.36* 0.34* 0.39* 0.40*  CALCIUM 6.1* 6.3* 5.2* 6.7* 6.5*  MG  --   --   --   --  1.9   Liver Function Tests: Recent Labs  Lab  01/11/19 1036 01/14/19 0905  AST 15 13*  ALT 8 6  ALKPHOS 213* 201*  BILITOT 0.6 0.6  PROT 6.3* 6.5  ALBUMIN 2.9* 2.7*   No results for input(s): LIPASE, AMYLASE in the last 168 hours. No results for input(s): AMMONIA in the last 168 hours. CBC: Recent Labs  Lab 01/09/19 0445 01/09/19 1947 01/10/19 0500 01/11/19 0530 01/12/19 0415 01/13/19 0500  WBC 8.7  --  9.6 9.7 8.5 8.0  HGB 8.2* 8.8* 9.0* 8.9* 8.8* 8.9*  HCT 28.5* 26.0* 29.2* 29.7* 29.7* 29.1*  MCV 113.1*  --  104.3* 106.8* 106.5* 106.6*  PLT 377  --  341 345 314 326   Cardiac Enzymes:   No results for input(s): CKTOTAL, CKMB, CKMBINDEX, TROPONINI in the last 168 hours. BNP (last 3 results) Recent Labs    10/26/18 1921 11/14/18 1621 12/25/18 1009  BNP 48.6 37.5 89.0    ProBNP (last 3 results) No results for input(s): PROBNP in the last 8760 hours.  CBG: Recent Labs  Lab 01/10/19 0747 01/10/19 1558  GLUCAP 98 87    Recent Results (from the past 240 hour(s))  Surgical pcr screen     Status: Abnormal   Collection Time: 01/09/19  1:49 PM   Specimen: Nasal Mucosa; Nasal Swab  Result Value Ref Range Status   MRSA, PCR NEGATIVE NEGATIVE Final   Staphylococcus aureus POSITIVE (A) NEGATIVE Final    Comment: (NOTE) The Xpert SA Assay (FDA approved for NASAL specimens in patients 78 years of age and older), is one component of a comprehensive surveillance program. It is not intended to diagnose infection nor to guide or monitor treatment. Performed at [facility] [place] [address] [place], [place] [address]      Studies: No results found.  Scheduled Meds: . anastrozole  1 mg Oral Daily  . carvedilol  6.25 mg Oral BID WC  . chlorhexidine  15 mL Mouth Rinse BID  . Chlorhexidine Gluconate Cloth  6 each Topical Daily  . docusate sodium  100 mg Oral BID  . enoxaparin (LOVENOX) injection  40 mg Subcutaneous Q24H  . feeding supplement  1 Container Oral TID BM  . feeding supplement  (ENSURE ENLIVE)  237 mL Oral BID BM  . furosemide  20 mg Intravenous BID  . LORazepam  1 mg Intravenous Once  . mouth rinse  15 mL Mouth Rinse q12n4p  . multivitamin with minerals  1 tablet Oral Daily  . nystatin   Topical TID  . palbociclib  100 mg Oral Daily  . pantoprazole (PROTONIX) IV  40 mg Intravenous Daily  . potassium chloride  40 mEq Oral Q4H  . senna  1 tablet Oral Daily  . sodium chloride flush  3 mL Intravenous Q12H  Continuous Infusions: . sodium chloride 250 mL (01/12/19 1304)  . sodium chloride Stopped (01/13/19 2144)  .  ceFAZolin (ANCEF) IV Stopped (01/15/19 1314)     Time spent: 10mins I have personally reviewed and interpreted on  01/15/2019 daily labs, imagings as discussed above under date review session and assessment and plans.  I reviewed all nursing notes, pharmacy notes, consultant notes,  vitals, pertinent old records  I have discussed plan of care as described above with RN , patient and  on 01/15/2019   Florencia Reasons MD, PhD, [place]  [facility] Pager [phone]. If 7PM-7AM, please contact night-coverage at [email or web address], password [facility] 01/15/2019, 8:01 AM  LOS: 21 days

## 2019-01-15 NOTE — Progress Notes (Signed)
[facility] CSW Progress Notes  Suzanne [name], daughter, to follow up on concerns expressed by daughter by email. Nature conservation officer from [employer] is "coming today to look at it", concerned that ramp construction may not be feasible due to location of neighbor's property line and length of ramp.  Unclear what timeline will be for ramp construction as project is complex.  CSW also checked w [employer], a [employer] agency that can help w ramps.  However, because house is a rental property they are unable to assist.    Daughter is to talk w [name] about status of Medicaid application.  Daughter also expressed concern about receiving bills from hospital stating "Medicaid pending."  Some do not. "who do I believe?"  [name], [place] Worker Phone:  [phone]

## 2019-01-16 LAB — BASIC METABOLIC PANEL
Anion gap: 7 (ref 5–15)
BUN: 13 mg/dL (ref 6–20)
CO2: 29 mmol/L (ref 22–32)
Calcium: 7.1 mg/dL — ABNORMAL LOW (ref 8.9–10.3)
Chloride: 103 mmol/L (ref 98–111)
Creatinine, Ser: 0.42 mg/dL — ABNORMAL LOW (ref 0.44–1.00)
GFR calc Af Amer: >60 mL/min (ref >60)
GFR calc non Af Amer: >60 mL/min (ref >60)
Glucose, Bld: 117 mg/dL — ABNORMAL HIGH (ref 70–99)
Potassium: 3.5 mmol/L (ref 3.5–5.1)
Sodium: 139 mmol/L (ref 135–145)

## 2019-01-16 LAB — NOVEL CORONAVIRUS, NAA (HOSP ORDER, SEND-OUT TO REF LAB; TAT 18-24 HRS): SARS-CoV-2, NAA: NOT DETECTED

## 2019-01-16 MED ORDER — SENNOSIDES-DOCUSATE SODIUM 8.6-50 MG PO TABS: 1.0000 | ORAL_TABLET | Freq: Every evening | ORAL | Status: DC | PRN

## 2019-01-16 MED ORDER — POTASSIUM CHLORIDE CRYS ER 20 MEQ PO TBCR
40.0000 meq | EXTENDED_RELEASE_TABLET | Freq: Once | ORAL | Status: AC
  Administered 2019-01-16: 17:00:00 40 meq via ORAL
  Filled 2019-01-16: qty 2

## 2019-01-16 NOTE — Progress Notes (Signed)
PROGRESS NOTE  Suzanne [name] [id number] DOB: Sep 26, 1960 DOA: 12/25/2018 PCP: Patient, No Pcp Per  Brief Narrative: 58 year old with past medical history significant for stage IV breast cancer with known metastasis to bone, lung and lymph nodes and malignant pleural effusion admitted on 12/25/2018 with worsening shortness of breath and altered mental status.  Of note patient was diagnosed with metastatic breast cancer in early June 2020.  At the time of diagnosis she was found to have widespread metastatic disease and pathologic fracture in her right right clavicle.  She subsequently underwent neoadjuvant chemotherapy but has also had her treatment complicated by recurrent malignant right-sided pleural effusion, neutropenia and thrombocytopenia.  In the emergency room patient was noted to be septic, likely secondary to sacral decubital ulcer, blood cultures on admission grew MSSA.  ID was consulted and recommendation was for 4 weeks of Ancef, which finish on 8/27.  Patient was also found to have a pathologic left humeral fracture.  Examination by orthopedic surgery recommended surgery this admission.  Patient underwent  left Humerus fracture open reduction and internal fixation using Bionmet ALPS plate on [id number].  Post surgery patient develop acute respiratory distress and she was reintubated.  Patient was subsequently extubated on 8/15, she was placed on BiPAP.  Her respiratory status has been stable, on BiPAP at bedtime.  Respiratory failure was thought to be related to pulmonary edema and pleural effusion and chronic elevation of the right hemi-diaphragma. she was a started on IV Lasix .     HPI/Recap of past 24 hours:  Uneventful night, 1.8 liter  urine output documented, urine is clear   She is on bipap at night, nasal cannula during day time  she is currently on 2-3liters o2 at rest, denies chest pain, no sob at rest, she reports bilateral lower extremity edema has resolved  She  walked with physical therapy in the hallway this am with 4liter o2  I did not hear cough during encounter  She is tolerating iv abx treatment  She reports rash on her right breast    Assessment/Plan: Principal Problem:   Acute on chronic respiratory failure with hypoxia (HCC) Active Problems:   Bone metastasis (HCC)   Pleural effusion, bilateral   Carcinoma of breast, estrogen receptor positive, stage 4, right (HCC)   Pressure injury of skin   Pneumonia   Respiratory failure, acute ([place])   Fracture closed, humerus, shaft   Acute on chronic diastolic CHF (congestive heart failure) (HCC)   Acute on chronic hypoxic respiratory failure, likely  Multifactorial including malignant pleural effusion , acute on chronic diastolic chf:  -Patient presented with worsening hypoxia.  cxr with known bilateral pleural effusions on presentation.  cta on presentation, negative for PE -She has a known right malignant pleural effusion.  Last thoracentesis was 6/20.  Per chart review, there was some concern of possible lymphangitic spread of tumor in the lungs. -She was initially on IV Solu-Medrol which has been discontinued. -Patient had to be re-intubated post surgery. She became hypoxic and respiratory distress.  -She was extubated this morning 8-15, place on BIPAP , appreciate CCM assistance.  -continue Diuresis as long as bp and cr allows. Lasix 20 IV BID. -5.2 L today. She reports had bilateral lower extremity edema for a month prior to hospitalization -CCM recommending BIPAP at HS and PRN during day.  Continue with incentive spirometry, out of bed.   MSSA bacteremia/sepsis; -Sepsis present on admission.  Likely source a stage II/III sacral decubitus ulcer. -ID consultation  was obtained. -Sacral wound did not require any surgical intervention.. -TTE did not show any signs of endocarditis.. -Repeated blood cultures on 7/30 first did not show any growth -Per ID recommendation, patient is  currently on 4 weeks course of Ancef until 8/27 - single lumen PICC line to right arm placed on 8/4  Stage IV metastatic breast cancer to bone and lung: -Oncology follow up appreciated.. -Patient to continue oral chemotherapy with anastrozole, palbociclib and denosumab/xgeva.   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 Seen by radiation oncology.  Apparently patient had an appointment with orthopedic on 8/6 which she missed because she was in the hospital.  Seen by Dr. Veverly Fells with orthopedic who recommended surgery this admission. Dr. Maryland Pink discussed case with Dr. Jana Hakim and orthopedic doctor and decision was made to proceed with surgery this admission. S/P left Humerus fracture open reduction and internal fixation using Bionmet ALPS plate  Has sling to right arm, follow with ortho  Dr Veverly Fells in two weeks  Elevated d-dimer: likely from active cancer, CT angios chest was negative for PE, Dopplers of lower extremity negative for DVT.  Elevated alkaline phosphatase: Secondary to metastatic bone disease.   Stage II-3 decubitus ulcer sacrum present on admission Local care.  Pressure Injury 12/25/18 Sacrum Mid Deep Tissue Injury - Purple or maroon localized area of discolored intact skin or blood-filled blister due to damage of underlying soft tissue from pressure and/or shear. evolving into stage 2 in patchy areas when asse (Active)  12/25/18 1700  Location: Sacrum  Location Orientation: Mid  Staging: Deep Tissue Injury - Purple or maroon  localized area of discolored intact skin or blood-filled blister due to damage of underlying soft tissue from pressure and/or shear.  Wound Description (Comments): evolving into stage 2 in patchy areas when assessed on 8/2  Present on Admission: Yes     Nutrition Problem: Increased nutrient needs Etiology: chronic illness, cancer and cancer related treatments   FTT: she is declined by CIR, snf placement recommended  DVT Prophylaxis:lovenox   Code Status: full  Family Communication: patient   Disposition Plan: SNF, covid screening  For snf placement obtained on 8/19 Patient declined my offer to call her family   Consultants:  Oncology  Critical care  Ortho  Infectious disease  Procedures:  OPEN REDUCTION INTERNAL FIXATION (ORIF) HUMERAL FRACTURE on 8/14  Intubation/extubation  Single lumen picc line placement on 8/4  Antibiotics:  IV Ancef from 7/31---8/27   Objective: BP 127/68 (BP Location: Left Leg)   Pulse 98   Temp 98.7 F (37.1 C) (Oral)   Resp 20   Ht 5\' 3"  (1.6 m)   Wt 80.1 kg   LMP 05/28/2013 (Within Months)   SpO2 100%   BMI 31.28 kg/m   Intake/Output Summary (Last 24 hours) at 01/16/2019 1544 Last data filed at 01/16/2019 0625 Gross per 24 hour  Intake 10 ml  Output 650 ml  Net -640 ml   Filed Weights   01/13/19 0500 01/15/19 0500 01/16/19 0651  Weight: 84.3 kg 79.2  kg 80.1 kg    Exam: Patient is examined daily including today on 01/16/2019, exams remain the same as of yesterday except that has changed    General:  Frail, NAD, left arm in sling, picc line right arm  Cardiovascular: RRR  Respiratory: diminished at basis, no wheezing , no rales  Abdomen: Soft/ND/NT, positive BS  Musculoskeletal: No Edema  Neuro: alert, oriented   Data Reviewed: Basic Metabolic Panel: Recent Labs  Lab 01/12/19 0415 01/13/19 0500 01/14/19 0905 01/15/19 0500 01/16/19 0624  NA 139 137 136 136 139  K 3.9 3.9 2.9* 3.2* 3.5  CL 100 100  97* 100 103  CO2 28 29 28 28 29   GLUCOSE 91 101* 109* 116* 117*  BUN 8 6 11 12 13   CREATININE 0.36* 0.34* 0.39* 0.40* 0.42*  CALCIUM 6.3* 5.2* 6.7* 6.5* 7.1*  MG  --   --   --  1.9  --    Liver Function Tests: Recent Labs  Lab 01/11/19 1036 01/14/19 0905  AST 15 13*  ALT 8 6  ALKPHOS 213* 201*  BILITOT 0.6 0.6  PROT 6.3* 6.5  ALBUMIN 2.9* 2.7*   No results for input(s): LIPASE, AMYLASE in the last 168 hours. No results for input(s): AMMONIA in the last 168 hours. CBC: Recent Labs  Lab 01/09/19 1947 01/10/19 0500 01/11/19 0530 01/12/19 0415 01/13/19 0500  WBC  --  9.6 9.7 8.5 8.0  HGB 8.8* 9.0* 8.9* 8.8* 8.9*  HCT 26.0* 29.2* 29.7* 29.7* 29.1*  MCV  --  104.3* 106.8* 106.5* 106.6*  PLT  --  341 345 314 326   Cardiac Enzymes:   No results for input(s): CKTOTAL, CKMB, CKMBINDEX, TROPONINI in the last 168 hours. BNP (last 3 results) Recent Labs    10/26/18 1921 11/14/18 1621 12/25/18 1009  BNP 48.6 37.5 89.0    ProBNP (last 3 results) No results for input(s): PROBNP in the last 8760 hours.  CBG: Recent Labs  Lab 01/10/19 0747 01/10/19 1558  GLUCAP 98 87    Recent Results (from the past 240 hour(s))  Surgical pcr screen     Status: Abnormal   Collection Time: 01/09/19  1:49 PM   Specimen: Nasal Mucosa; Nasal Swab  Result Value Ref Range Status   MRSA, PCR NEGATIVE NEGATIVE Final   Staphylococcus aureus POSITIVE (A) NEGATIVE Final    Comment: (NOTE) The Xpert SA Assay (FDA approved for NASAL specimens in patients 17 years of age and older), is one component of a comprehensive surveillance program. It is not intended to diagnose infection nor to guide or monitor treatment. Performed at [facility], [place] [address], [place], [place] [address]   Novel Coronavirus, NAA (hospital order; send-out to ref lab)     Status: None   Collection Time: 01/14/19 12:42 PM   Specimen: Nasopharyngeal Swab; Respiratory  Result Value Ref Range  Status   SARS-CoV-2, NAA NOT DETECTED NOT DETECTED Final    Comment: (NOTE) This test was developed and its performance characteristics determined by [employer]. This test has not been FDA cleared or approved. This test has been authorized by FDA under an Emergency Use Authorization (EUA). This test is only authorized for the duration of time the declaration that circumstances exist justifying the authorization of the emergency use of in vitro diagnostic tests for detection of SARS-CoV-2 virus and/or diagnosis of COVID-19 infection under section 564(b)(1) of the Act, 21 U.S.C. [id number]), unless the authorization is terminated or revoked sooner. When diagnostic testing  is negative, the possibility of a false negative result should be considered in the context of a patient's recent exposures and the presence of clinical signs and symptoms consistent with COVID-19. An individual without symptoms of COVID-19 and who is not shedding SARS-CoV-2 virus would expect to have a negative (not detected) result in this assay. Performed  At: [facility] [place], [place] [id number] Rush Farmer MD [id number]    [place]  Final    Comment: Performed at [facility] [address], [place], [place] [address]  Culture, Urine     Status: Abnormal (Preliminary result)   Collection Time: 01/15/19 11:50 AM   Specimen: Urine, Random  Result Value Ref Range Status   Specimen Description   Final    URINE, RANDOM Performed at [facility], [place] [address], [place], [place] [address]    Special Requests   Final    NONE Performed at [facility], [place] [address], [place], [place] [address]    Culture (A)  Final    10,000 COLONIES/mL ENTEROBACTER SPECIES SUSCEPTIBILITIES TO FOLLOW Performed at [facility], [place] [address], [place],  [address]    Report Status PENDING   Incomplete     Studies: No results found.  Scheduled Meds: . anastrozole  1 mg Oral Daily  . carvedilol  6.25 mg Oral BID WC  . chlorhexidine  15 mL Mouth Rinse BID  . Chlorhexidine Gluconate Cloth  6 each Topical Daily  . enoxaparin (LOVENOX) injection  40 mg Subcutaneous Q24H  . feeding supplement  1 Container Oral TID BM  . feeding supplement (ENSURE ENLIVE)  237 mL Oral BID BM  . furosemide  20 mg Intravenous BID  . LORazepam  1 mg Intravenous Once  . mouth rinse  15 mL Mouth Rinse q12n4p  . multivitamin with minerals  1 tablet Oral Daily  . nystatin   Topical TID  . potassium chloride  40 mEq Oral Once  . sodium chloride flush  3 mL Intravenous Q12H    Continuous Infusions: . sodium chloride 250 mL (01/12/19 1304)  . sodium chloride Stopped (01/13/19 2144)  .  ceFAZolin (ANCEF) IV 2 g (01/16/19 1506)     Time spent: 52mins I have personally reviewed and interpreted on  01/16/2019 daily labs, imagings as discussed above under date review session and assessment and plans.  I reviewed all nursing notes, pharmacy notes, consultant notes,  vitals, pertinent old records  I have discussed plan of care as described above with RN , patient on 01/16/2019   Florencia Reasons MD, PhD, FACP  [facility] Pager [phone]. If 7PM-7AM, please contact night-coverage at [email or web address], password [facility] 01/16/2019, 3:44 PM  LOS: 22 days

## 2019-01-16 NOTE — Progress Notes (Signed)
Physical Therapy Treatment Patient Details Name: Suzanne [name] MRN: [id number] DOB: 02/08/1961 Today's Date: 01/16/2019    History of Present Illness Pt admitted with acute on chronic respiratory failure and hx of breast CA with mets to bone (currently with L UE in sling 2* pathologic fx) and lungs, chronic diastolic heart failure, anemia and pleural effusion. Pt now s/p LUE ORIF 8/14 and off vent 8/16    PT Comments    Pt ambulated 12' + 18' with quad cane and 4L O2. Distance limited by 2/4 dyspnea and fatigue, pulse oximeter did not give a reading.   Follow Up Recommendations  SNF     Equipment Recommendations  None recommended by PT    Recommendations for Other Services       Precautions / Restrictions Precautions Precautions: Fall;Shoulder Type of Shoulder Precautions: Elbow, wrist, hand ROM ok; Pendulums and scap retraction ok; No AROM/PROM to shoulder Shoulder Interventions: Shoulder sling/immobilizer Required Braces or Orthoses: Sling Restrictions Weight Bearing Restrictions: Yes LUE Weight Bearing: Non weight bearing Other Position/Activity Restrictions: L UE    Mobility  Bed Mobility Overal bed mobility: Needs Assistance Bed Mobility: Sit to Supine       Sit to supine: Min assist   General bed mobility comments: assist for LEs into bed  Transfers Overall transfer level: Needs assistance Equipment used: Quad cane Transfers: Sit to/from Stand Sit to Stand: +2 safety/equipment;Mod assist         General transfer comment: VCs hand placement, assist to rise  Ambulation/Gait   Gait Distance (Feet): 30 Feet(12' + 18' with seated rest) Assistive device: Quad cane;1 person hand held assist Gait Pattern/deviations: Step-through pattern;Decreased stride length Gait velocity: decr   General Gait Details: distance limited by fatigue, pt ambulated with 4L O2, pulse ox did not read   Stairs             Wheelchair Mobility    Modified Rankin  (Stroke Patients Only)       Balance Overall balance assessment: Needs assistance         Standing balance support: Single extremity supported Standing balance-Leahy Scale: Poor                              Cognition Arousal/Alertness: Awake/alert Behavior During Therapy: WFL for tasks assessed/performed Overall Cognitive Status: Within Functional Limits for tasks assessed                                        Exercises      General Comments        Pertinent Vitals/Pain Pain Assessment: No/denies pain    Home Living                      Prior Function            PT Goals (current goals can now be found in the care plan section) Acute Rehab PT Goals Patient Stated Goal: Short term rehab PT Goal Formulation: With patient Time For Goal Achievement: 01/18/19 Potential to Achieve Goals: Fair Progress towards PT goals: Progressing toward goals    Frequency    Min 3X/week      PT Plan Current plan remains appropriate    Co-evaluation              AM-PAC PT "6 Clicks" Mobility  Outcome Measure  Help needed turning from your back to your side while in a flat bed without using bedrails?: A Lot Help needed moving from lying on your back to sitting on the side of a flat bed without using bedrails?: A Lot Help needed moving to and from a bed to a chair (including a wheelchair)?: A Little Help needed standing up from a chair using your arms (e.g., wheelchair or bedside chair)?: A Lot Help needed to walk in hospital room?: A Lot Help needed climbing 3-5 steps with a railing? : Total 6 Click Score: 12    End of Session Equipment Utilized During Treatment: Gait belt;Oxygen Activity Tolerance: Patient limited by fatigue Patient left: in bed;with call bell/phone within reach;with bed alarm set Nurse Communication: Mobility status PT Visit Diagnosis: Difficulty in walking, not elsewhere classified (R26.2);Unsteadiness  on feet (R26.81)     Time: [id number] PT Time Calculation (min) (ACUTE ONLY): 24 min  Charges:  $Gait Training: 8-22 mins $Therapeutic Activity: 8-22 mins                     Blondell Reveal Kistler PT 01/16/2019  Acute Rehabilitation Services Pager [phone] Office [phone]

## 2019-01-16 NOTE — Progress Notes (Signed)
Nutrition Follow-up  DOCUMENTATION CODES:   Obesity unspecified  INTERVENTION:   -Continue Ensure Enlive po BID, each supplement provides 350 kcal and 20 grams of protein -Continue Magic cup BID with meals, each supplement provides 290 kcal and 9 grams of protein -Continue Multivitamin with minerals daily  NUTRITION DIAGNOSIS:   Increased nutrient needs related to chronic illness, cancer and cancer related treatments as evidenced by estimated needs.  Ongoing.  GOAL:   Patient will meet greater than or equal to 90% of their needs  Progressing.  MONITOR:   PO intake, Supplement acceptance, Labs, Weight trends, I & O's  ASSESSMENT:   58 y.o. female with past medical history significant for stage 4 breast cancer with metastasis to bone, lung, and lymph node, chronic hypoxic respiratory failure on 2L of O2, CHF, malignant pleural effusion diagnosed in 10/2018. She presented to the ED complaining of worsening shortness of breath that started the morning of admission.  7/30: admitted 8/14: s/p Left humerus fracture open reduction internal fixation, re-intubated in PACU 8/15: extubated  **RD working remotely**  Patient consumed 50% of breakfast yesterday. No PO documented since. Pt is drinking Ensure supplements and does not prefer the Boost Breeze supplements, will d/c. Pt is also receiving Magic Cups on lunch and dinner trays now.  Per SLP note,  Pt exhibited no issues swallowing but did have excessive coughing.   Admission weight: 192 lbs. Current weight:176 lbs.  I/Os: -6.2L since 8/7 UOP 8/20: 1.8L  Labs reviewed. Medications: IV Lasix BID  Diet Order:   Diet Order            DIET DYS 3 Room service appropriate? Yes; Fluid consistency: Thin  Diet effective now              EDUCATION NEEDS:   Not appropriate for education at this time  Skin:  Skin Assessment: Skin Integrity Issues: Skin Integrity Issues:: DTI DTI: sacrum  Last BM:  8/20  Height:   Ht  Readings from Last 1 Encounters:  12/27/18 5\' 3"  (1.6 m)    Weight:   Wt Readings from Last 1 Encounters:  01/16/19 80.1 kg    Ideal Body Weight:  52.3 kg  BMI:  Body mass index is 31.28 kg/m.  Estimated Nutritional Needs:   Kcal:  [id number] kcal  Protein:  130-140 grams  Fluid:  >/= 1.5 L/day  Clayton Bibles, MS, RD, LDN [place] [facility] Pager: [phone] After Hours Pager: [phone]

## 2019-01-17 ENCOUNTER — Inpatient Hospital Stay (HOSPITAL_COMMUNITY): Payer: Medicaid Other

## 2019-01-17 DIAGNOSIS — R627 Adult failure to thrive: Secondary | ICD-10-CM

## 2019-01-17 DIAGNOSIS — R7881 Bacteremia: Secondary | ICD-10-CM | POA: Diagnosis present

## 2019-01-17 LAB — CBC WITH DIFFERENTIAL/PLATELET
Abs Immature Granulocytes: 0.08 10*3/uL — ABNORMAL HIGH (ref 0.00–0.07)
Basophils Absolute: 0 10*3/uL (ref 0.0–0.1)
Basophils Relative: 0 %
Eosinophils Absolute: 0.2 10*3/uL (ref 0.0–0.5)
Eosinophils Relative: 2 %
HCT: 27.9 % — ABNORMAL LOW (ref 36.0–46.0)
Hemoglobin: 8.3 g/dL — ABNORMAL LOW (ref 12.0–15.0)
Immature Granulocytes: 1 %
Lymphocytes Relative: 23 %
Lymphs Abs: 1.9 10*3/uL (ref 0.7–4.0)
MCH: 32.3 pg (ref 26.0–34.0)
MCHC: 29.7 g/dL — ABNORMAL LOW (ref 30.0–36.0)
MCV: 108.6 fL — ABNORMAL HIGH (ref 80.0–100.0)
Monocytes Absolute: 0.6 10*3/uL (ref 0.1–1.0)
Monocytes Relative: 7 %
Neutro Abs: 5.5 10*3/uL (ref 1.7–7.7)
Neutrophils Relative %: 67 %
Platelets: 436 10*3/uL — ABNORMAL HIGH (ref 150–400)
RBC: 2.57 MIL/uL — ABNORMAL LOW (ref 3.87–5.11)
RDW: 22.1 % — ABNORMAL HIGH (ref 11.5–15.5)
WBC: 8.3 10*3/uL (ref 4.0–10.5)
nRBC: 0.2 % (ref 0.0–0.2)

## 2019-01-17 LAB — BASIC METABOLIC PANEL
Anion gap: 8 (ref 5–15)
BUN: 16 mg/dL (ref 6–20)
CO2: 28 mmol/L (ref 22–32)
Calcium: 7.3 mg/dL — ABNORMAL LOW (ref 8.9–10.3)
Chloride: 101 mmol/L (ref 98–111)
Creatinine, Ser: 0.38 mg/dL — ABNORMAL LOW (ref 0.44–1.00)
GFR calc Af Amer: >60 mL/min (ref >60)
GFR calc non Af Amer: >60 mL/min (ref >60)
Glucose, Bld: 110 mg/dL — ABNORMAL HIGH (ref 70–99)
Potassium: 3.4 mmol/L — ABNORMAL LOW (ref 3.5–5.1)
Sodium: 137 mmol/L (ref 135–145)

## 2019-01-17 LAB — URINE CULTURE: Culture: 10000 — AB

## 2019-01-17 LAB — MAGNESIUM: Magnesium: 2.2 mg/dL (ref 1.7–2.4)

## 2019-01-17 MED ORDER — ALUM & MAG HYDROXIDE-SIMETH 200-200-20 MG/5ML PO SUSP
30.0000 mL | Freq: Four times a day (QID) | ORAL | Status: DC | PRN
  Administered 2019-01-17: 30 mL via ORAL
  Filled 2019-01-17: qty 30

## 2019-01-17 MED ORDER — ENOXAPARIN SODIUM 40 MG/0.4ML ~~LOC~~ SOLN: 40.0000 mg | SUBCUTANEOUS | Status: DC

## 2019-01-17 MED ORDER — GUAIFENESIN ER 600 MG PO TB12
600.0000 mg | ORAL_TABLET | Freq: Two times a day (BID) | ORAL | Status: DC
  Administered 2019-01-17 – 2019-01-24 (×15): 600 mg via ORAL
  Filled 2019-01-17 (×15): qty 1

## 2019-01-17 MED ORDER — IPRATROPIUM-ALBUTEROL 0.5-2.5 (3) MG/3ML IN SOLN
3.0000 mL | Freq: Two times a day (BID) | RESPIRATORY_TRACT | Status: DC
  Administered 2019-01-17 (×2): 3 mL via RESPIRATORY_TRACT
  Filled 2019-01-17 (×2): qty 3

## 2019-01-17 NOTE — Progress Notes (Addendum)
PROGRESS NOTE  Suzanne [name] [id number] DOB: 07-10-1960 DOA: 12/25/2018 PCP: Patient, No Pcp Per  Brief Narrative: 58 year old with past medical history significant for stage IV breast cancer with known metastasis to bone, lung and lymph nodes and malignant pleural effusion admitted on 12/25/2018 with worsening shortness of breath and altered mental status.  Of note patient was diagnosed with metastatic breast cancer in early June 2020.  At the time of diagnosis she was found to have widespread metastatic disease and pathologic fracture in her right right clavicle.  She subsequently underwent neoadjuvant chemotherapy but has also had her treatment complicated by recurrent malignant right-sided pleural effusion, neutropenia and thrombocytopenia.  In the emergency room patient was noted to be septic, likely secondary to sacral decubital ulcer, blood cultures on admission grew MSSA.  ID was consulted and recommendation was for 4 weeks of Ancef, which finish on 8/27.  Patient was also found to have a pathologic left humeral fracture.  Examination by orthopedic surgery recommended surgery this admission.  Patient underwent  left Humerus fracture open reduction and internal fixation using Bionmet ALPS plate on [id number].  Post surgery patient develop acute respiratory distress and she was reintubated.  Patient was subsequently extubated on 8/15, she was placed on BiPAP.  Her respiratory status has been stable, on BiPAP at bedtime.  Respiratory failure was thought to be related to pulmonary edema and pleural effusion and chronic elevation of the right hemi-diaphragma. she was a started on IV Lasix .     HPI/Recap of past 24 hours:   She was crying this am due to left arm pain, she received prn fentanyl, she reports feeling frustrated,  There is no SNF offer and she does not have enough care at home as her daughter has cerebral palsy,  She can not get around without assistance due to left arm in sling  and nonweight bearing due to fracture and right arm has picc line in for abx infusion.   urine output  900cc documented Last 24hrs,  urine is clear , she denies urinary symptom tmax 99.5  She is on bipap at night, nasal cannula during day time  she is currently on 2-3liters o2 at rest, denies chest pain, no sob at rest, she reports bilateral lower extremity edema has resolved  Today, she appear to have more congested cough  She is tolerating iv abx treatment  She reports rash on her right breast    Assessment/Plan: Principal Problem:   Acute on chronic respiratory failure with hypoxia (HCC) Active Problems:   Bone metastasis (HCC)   Pleural effusion, bilateral   Carcinoma of breast, estrogen receptor positive, stage 4, right (HCC)   Pressure injury of skin   Pneumonia   Respiratory failure, acute ([place])   Fracture closed, humerus, shaft   Acute on chronic diastolic CHF (congestive heart failure) (HCC)   Acute on chronic hypoxic respiratory failure, likely  Multifactorial including malignant pleural effusion , acute on chronic diastolic chf:  -Patient presented with worsening hypoxia.  cxr with known bilateral pleural effusions on presentation.  cta on presentation, negative for PE -She has a known right malignant pleural effusion.  Last thoracentesis was 6/20.  Per chart review, there was some concern of possible lymphangitic spread of tumor in the lungs. -She was initially on IV Solu-Medrol which has been discontinued. -Patient had to be re-intubated post surgery. She became hypoxic and respiratory distress.  -She was extubated this morning 8-15, place on BIPAP , appreciate CCM assistance.  -  continue Diuresis as long as bp and cr allows. Lasix 20 IV BID. -5.2 L today. She reports had bilateral lower extremity edema for a month prior to hospitalization -CCM recommending BIPAP at HS and PRN during day.  Continue with incentive spirometry, out of bed.   Coughing more today, will  schedule mucinex, trial of nebs, will get cxr.  Addendum: repeat cxr showed recurrent right pleural effusion, will consult IR for right sided thoracentesis , fluids study ordered ( fluids study, culture , cell count, gram stain, cytology)  MSSA bacteremia/sepsis; -Sepsis present on admission.  Likely source a stage II/III sacral decubitus ulcer. -ID consultation was obtained. -Sacral wound did not require any surgical intervention.. -TTE did not show any signs of endocarditis.. -Repeated blood cultures on 7/30 first did not show any growth -Per ID recommendation, patient is currently on 4 weeks course of Ancef until 8/27 - single lumen PICC line to right arm placed on 8/4   Bacteriuria :  Denies urinary symptom, urine appear clear, no fever, no leukocytosis, cr wnl, will hold off abx Monitor   Stage IV metastatic breast cancer to bone and lung: -Oncology follow up appreciated.. -Patient to continue oral chemotherapy with anastrozole, palbociclib and denosumab/xgeva.   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 Seen by radiation oncology.  Apparently patient had an appointment with orthopedic on 8/6 which she missed because she was in the hospital.  Seen by Dr. Veverly Fells with orthopedic who recommended surgery this admission. Dr. Maryland Pink discussed case with Dr. Jana Hakim and orthopedic doctor and decision was made to proceed with surgery this admission. S/P left Humerus fracture open reduction and internal fixation using Bionmet ALPS plate  Has  sling to right arm, follow with ortho  Dr Veverly Fells in two weeks  Elevated d-dimer: likely from active cancer, CT angios chest was negative for PE, Dopplers of lower extremity negative for DVT.  Elevated alkaline phosphatase: Secondary to metastatic bone disease.   Stage II-3 decubitus ulcer sacrum present on admission Local care.  Pressure Injury 12/25/18 Sacrum Mid Deep Tissue Injury - Purple or maroon localized area of discolored intact skin or blood-filled blister due to damage of underlying soft tissue from pressure and/or shear. evolving into stage 2 in patchy areas when asse (Active)  12/25/18 1700  Location: Sacrum  Location Orientation: Mid  Staging: Deep Tissue Injury - Purple or maroon localized area of discolored intact skin or blood-filled blister due to damage of underlying soft tissue from pressure and/or shear.  Wound Description (Comments): evolving into stage 2 in patchy areas when assessed on 8/2  Present on Admission: Yes     Nutrition Problem: Increased nutrient needs Etiology: chronic illness, cancer and cancer related treatments   FTT: she is declined by CIR, snf placement recommended  DVT Prophylaxis:lovenox   Code Status: full  Family Communication: patient   Disposition Plan: SNF, covid screening  For snf placement obtained on 8/19 result come back negative Patient declined my offer to call her family No snf bed yet, not safe to go  home as left arm nonweight bearing, right arm + picc line Daughter has cerebral palsy  May have to finish abx treatment here, then remove picc line, then home with home health if still no snf offer She reports hopefully she can have a ramp built in two weeks to help her get in and out of the house    Consultants:  Oncology  Critical care  Ortho  Infectious disease  Procedures:  OPEN REDUCTION INTERNAL FIXATION (ORIF) HUMERAL FRACTURE on 8/14  Intubation/extubation  Single lumen picc line placement on 8/4   Antibiotics:  IV Ancef from 7/31---8/27   Objective: BP (!) 123/52 (BP Location: Left Leg)   Pulse 93   Temp 97.9 F (36.6 C)   Resp 20   Ht 5\' 3"  (1.6 m)   Wt 83.2 kg   LMP 05/28/2013 (Within Months)   SpO2 99%   BMI 32.51 kg/m  No intake or output data in the 24 hours ending 01/17/19 0909 Filed Weights   01/15/19 0500 01/16/19 0651 01/17/19 0437  Weight: 79.2 kg 80.1 kg 83.2 kg    Exam: Patient is examined daily including today on 01/17/2019, exams remain the same as of yesterday except that has changed    General:  Frail, NAD, left arm in sling, picc line in right arm  Cardiovascular: RRR  Respiratory: diminished at basis, no wheezing , no rales  Abdomen: Soft/ND/NT, positive BS  Musculoskeletal: No Edema  Neuro: alert, oriented   Data Reviewed: Basic Metabolic Panel: Recent Labs  Lab 01/13/19 0500 01/14/19 0905 01/15/19 0500 01/16/19 0624 01/17/19 0500  NA 137 136 136 139 137  K 3.9 2.9* 3.2* 3.5 3.4*  CL 100 97* 100 103 101  CO2 29 28 28 29 28   GLUCOSE 101* 109* 116* 117* 110*  BUN 6 11 12 13 16   CREATININE 0.34* 0.39* 0.40* 0.42* 0.38*  CALCIUM 5.2* 6.7* 6.5* 7.1* 7.3*  MG  --   --  1.9  --  2.2   Liver Function Tests: Recent Labs  Lab 01/11/19 1036 01/14/19 0905  AST 15 13*  ALT 8 6  ALKPHOS 213* 201*  BILITOT 0.6 0.6  PROT 6.3* 6.5  ALBUMIN 2.9* 2.7*   No results for input(s): LIPASE, AMYLASE in the last 168 hours. No results for input(s): AMMONIA in the last 168 hours. CBC: Recent Labs  Lab 01/11/19 0530 01/12/19 0415 01/13/19 0500 01/17/19 0500  WBC 9.7 8.5 8.0 8.3  NEUTROABS  --   --   --  5.5  HGB 8.9* 8.8* 8.9* 8.3*  HCT 29.7* 29.7* 29.1* 27.9*  MCV 106.8* 106.5* 106.6* 108.6*  PLT 345 314 326 436*   Cardiac Enzymes:   No results for input(s): CKTOTAL, CKMB, CKMBINDEX, TROPONINI in the last 168 hours. BNP (last 3 results) Recent Labs    10/26/18 1921 11/14/18 1621 12/25/18 1009  BNP 48.6 37.5 89.0    ProBNP  (last 3 results) No results for input(s): PROBNP in the last 8760 hours.  CBG: Recent Labs  Lab 01/10/19 1558  GLUCAP 87    Recent Results (from the past 240 hour(s))  Surgical pcr screen     Status: Abnormal   Collection Time: 01/09/19  1:49 PM   Specimen: Nasal Mucosa; Nasal Swab  Result Value Ref Range Status   MRSA, PCR NEGATIVE NEGATIVE Final   Staphylococcus aureus POSITIVE (A) NEGATIVE Final    Comment: (NOTE) The Xpert SA Assay (FDA approved for NASAL specimens in patients 70 years of age  and older), is one component of a comprehensive surveillance program. It is not intended to diagnose infection nor to guide or monitor treatment. Performed at [facility], [place] [address], [place], [place] [address]   Novel Coronavirus, NAA (hospital order; send-out to ref lab)     Status: None   Collection Time: 01/14/19 12:42 PM   Specimen: Nasopharyngeal Swab; Respiratory  Result Value Ref Range Status   SARS-CoV-2, NAA NOT DETECTED NOT DETECTED Final    Comment: (NOTE) This test was developed and its performance characteristics determined by [employer]. This test has not been FDA cleared or approved. This test has been authorized by FDA under an Emergency Use Authorization (EUA). This test is only authorized for the duration of time the declaration that circumstances exist justifying the authorization of the emergency use of in vitro diagnostic tests for detection of SARS-CoV-2 virus and/or diagnosis of COVID-19 infection under section 564(b)(1) of the Act, 21 U.S.C. KA:123727), unless the authorization is terminated or revoked sooner. When diagnostic testing is negative, the possibility of a false negative result should be considered in the context of a patient's recent exposures and the presence of clinical signs and symptoms consistent with COVID-19. An individual without symptoms of COVID-19 and who is not shedding SARS-CoV-2 virus would  expect to have a negative (not detected) result in this assay. Performed  At: [facility] [place], [place] [id number] Rush Farmer MD [id number]    [place]  Final    Comment: Performed at [facility] [address], [place], [place] [address]  Culture, Urine     Status: Abnormal   Collection Time: 01/15/19 11:50 AM   Specimen: Urine, Random  Result Value Ref Range Status   Specimen Description   Final    URINE, RANDOM Performed at [facility] [address] [place], [place] [address]    Special Requests   Final    NONE Performed at [facility], [place] [address], [place],  [address]    Culture 10,000 COLONIES/mL ENTEROBACTER ASBURIAE (A)  Final   Report Status 01/17/2019 FINAL  Final   Organism ID, Bacteria ENTEROBACTER ASBURIAE (A)  Final      Susceptibility   Enterobacter asburiae - MIC*    CEFAZOLIN >=64 RESISTANT Resistant     CEFTRIAXONE >=64 RESISTANT Resistant     CIPROFLOXACIN <=0.25 SENSITIVE Sensitive     GENTAMICIN <=1 SENSITIVE Sensitive     IMIPENEM <=0.25 SENSITIVE Sensitive     NITROFURANTOIN 64 INTERMEDIATE Intermediate     TRIMETH/SULFA <=20 SENSITIVE Sensitive     PIP/TAZO >=128 RESISTANT Resistant     * 10,000 COLONIES/mL ENTEROBACTER ASBURIAE     Studies: No results found.  Scheduled Meds: . anastrozole  1 mg Oral Daily  . carvedilol  6.25 mg Oral BID WC  . chlorhexidine  15 mL Mouth Rinse BID  . Chlorhexidine Gluconate Cloth  6 each Topical Daily  . enoxaparin (LOVENOX) injection  40 mg Subcutaneous Q24H  . feeding supplement  1 Container Oral TID BM  . feeding supplement (ENSURE ENLIVE)  237 mL Oral BID BM  . furosemide  20 mg Intravenous BID  . mouth rinse  15 mL Mouth Rinse q12n4p  . multivitamin with minerals  1 tablet Oral Daily  . nystatin   Topical TID  . sodium chloride flush  3 mL Intravenous Q12H    Continuous  Infusions: . sodium chloride 250 mL (01/12/19  1304)  . sodium chloride Stopped (01/13/19 2144)  .  ceFAZolin (ANCEF) IV 2 g (01/17/19 0636)     Time spent: 60mins I have personally reviewed and interpreted on  01/17/2019 daily labs, imagings as discussed above under date review session and assessment and plans.  I reviewed all nursing notes, pharmacy notes, consultant notes,  vitals, pertinent old records  I have discussed plan of care as described above with RN , patient on 01/17/2019   Florencia Reasons MD, PhD, FACP  [facility] Pager [phone]. If 7PM-7AM, please contact night-coverage at [email or web address], password [facility] 01/17/2019, 9:09 AM  LOS: 23 days

## 2019-01-18 DIAGNOSIS — J910 Malignant pleural effusion: Secondary | ICD-10-CM

## 2019-01-18 MED ORDER — ENOXAPARIN SODIUM 40 MG/0.4ML ~~LOC~~ SOLN
40.0000 mg | SUBCUTANEOUS | Status: DC
  Administered 2019-01-20 – 2019-01-23 (×4): 40 mg via SUBCUTANEOUS
  Filled 2019-01-18 (×4): qty 0.4

## 2019-01-18 MED ORDER — CARVEDILOL 3.125 MG PO TABS
3.1250 mg | ORAL_TABLET | Freq: Two times a day (BID) | ORAL | Status: DC
  Administered 2019-01-18 – 2019-01-24 (×13): 3.125 mg via ORAL
  Filled 2019-01-18 (×13): qty 1

## 2019-01-18 MED ORDER — IPRATROPIUM-ALBUTEROL 0.5-2.5 (3) MG/3ML IN SOLN
3.0000 mL | Freq: Two times a day (BID) | RESPIRATORY_TRACT | Status: DC
  Administered 2019-01-18 – 2019-01-23 (×12): 3 mL via RESPIRATORY_TRACT
  Filled 2019-01-18 (×14): qty 3

## 2019-01-18 MED ORDER — ENOXAPARIN SODIUM 40 MG/0.4ML ~~LOC~~ SOLN
40.0000 mg | Freq: Every day | SUBCUTANEOUS | Status: AC
  Administered 2019-01-18: 40 mg via SUBCUTANEOUS
  Filled 2019-01-18: qty 0.4

## 2019-01-18 NOTE — Progress Notes (Addendum)
PROGRESS NOTE  Suzanne [name] [id number] DOB: May 30, 1960 DOA: 12/25/2018 PCP: Patient, No Pcp Per  Brief Narrative: 58 year old with past medical history significant for stage IV breast cancer with known metastasis to bone, lung and lymph nodes and malignant pleural effusion admitted on 12/25/2018 with worsening shortness of breath and altered mental status.  Of note patient was diagnosed with metastatic breast cancer in early June 2020.  At the time of diagnosis she was found to have widespread metastatic disease and pathologic fracture in her right right clavicle.  She subsequently underwent neoadjuvant chemotherapy but has also had her treatment complicated by recurrent malignant right-sided pleural effusion, neutropenia and thrombocytopenia.  In the emergency room patient was noted to be septic, likely secondary to sacral decubital ulcer, blood cultures on admission grew MSSA.  ID was consulted and recommendation was for 4 weeks of Ancef, which finish on 8/27.  Patient was also found to have a pathologic left humeral fracture.  Examination by orthopedic surgery recommended surgery this admission.  Patient underwent  left Humerus fracture open reduction and internal fixation using Bionmet ALPS plate on D34-534.  Post surgery patient develop acute respiratory distress and she was reintubated.  Patient was subsequently extubated on 8/15, she was placed on BiPAP.  Her respiratory status has been stable, on BiPAP at bedtime.  Respiratory failure was thought to be related to pulmonary edema and pleural effusion and chronic elevation of the right hemi-diaphragma. she was a started on IV Lasix .     HPI/Recap of past 24 hours:   She is having a better day today, not as feeling frustrated as yesterday She is on bipap at night, nasal cannula during day time  she is currently on 2-3liters o2 at rest, denies chest pain, no sob at rest, no edema   She is tolerating iv lasix and iv abx  treatment    Assessment/Plan: Principal Problem:   Acute on chronic respiratory failure with hypoxia (HCC) Active Problems:   Bone metastasis (HCC)   Pleural effusion, bilateral   Carcinoma of breast, estrogen receptor positive, stage 4, right (HCC)   Pressure injury of skin   Pneumonia   Respiratory failure, acute (HCC)   Fracture closed, humerus, shaft   Acute on chronic diastolic CHF (congestive heart failure) (HCC)   Bacteremia   FTT (failure to thrive) in adult   Acute on chronic hypoxic respiratory failure, likely  Multifactorial including malignant pleural effusion , acute on chronic diastolic chf:  -Patient presented with worsening hypoxia.  cxr with known bilateral pleural effusions on presentation.  cta on presentation, negative for PE -She has a known right malignant pleural effusion.  Last thoracentesis was 6/20.  Per chart review, there was some concern of possible lymphangitic spread of tumor in the lungs. -She was initially on IV Solu-Medrol which has been discontinued. -Patient had to be re-intubated post surgery. -She was extubated this morning 8-15, place on BIPAP  -continue Diuresis as long as bp and cr allows. Lasix 20 IV BID. -5.2 L today. She reports had bilateral lower extremity edema for a month prior to hospitalization -currently on BIPAP at HS and PRN during day, nasal cannula day time -Continue with incentive spirometry, out of bed.    repeat cxr on 8/22showed recurrent right pleural effusion,  IR consulted for right sided thoracentesis ( awaiting for repeat covid test per IR protocol before thoracentesis), fluids study ordered ( fluids study, culture , cell count, gram stain, cytology)  MSSA bacteremia/sepsis; -Sepsis present  on admission.  Likely source a stage II/III sacral decubitus ulcer. -ID consultation was obtained. -Sacral wound did not require any surgical intervention.. -TTE did not show any signs of endocarditis.. -Repeated blood cultures on  7/30 first did not show any growth -Per ID recommendation, patient is currently on 4 weeks course of Ancef until 8/27 - single lumen PICC line to right arm placed on 8/4   Bacteriuria :   Urine culture from 8/20 grew ENTEROBACTER ASBURIAE  Denies urinary symptom, urine appear clear, no fever, no leukocytosis, cr wnl, will hold off abx Monitor    Hypocalcemia: Last dose denosumab on 7/16 She is on lasix Calcium nadir at 5.2 on 8/18, today is 6.5, corrected calcium above 7 Monitor and replace prn   Hypokalemia: Remain low, continue to replace , repeat in am,  mag 1.9  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 - anastrozole, palbociclib and denosumab/xgeva per oncology  Rash on right breast, denies pain, not itchy Appear candida skin rash vs folliculitis Topical nystatin  Pathological left femur fractures: Noted on x-ray obtained on 7/25 as an outpatient for shoulder pain. Seen by radiation oncology.  Apparently patient had an appointment with orthopedic on 8/6 which she missed because she was in the hospital.  Seen by Dr. Veverly Fells with orthopedic who recommended surgery this admission. Dr. Maryland Pink discussed case with Dr. Jana Hakim and orthopedic doctor and decision was made to proceed with surgery this admission. S/P left Humerus fracture open reduction and internal fixation using Bionmet ALPS plate on D34-534 Has sling to right arm, follow with ortho  Dr Veverly Fells in two weeks, if remain in the hospital , may ask Dr Veverly Fells to come to see patient while in the hospital  Elevated d-dimer: likely from active cancer, CT angios chest was negative for PE, Dopplers of lower extremity negative for DVT.  Elevated alkaline phosphatase: Secondary to metastatic bone disease.  HTN: coreg, lasix  Stage II-3 decubitus ulcer sacrum present on admission Local care.  Pressure Injury  12/25/18 Sacrum Mid Deep Tissue Injury - Purple or maroon localized area of discolored intact skin or blood-filled blister due to damage of underlying soft tissue from pressure and/or shear. evolving into stage 2 in patchy areas when asse (Active)  12/25/18 1700  Location: Sacrum  Location Orientation: Mid  Staging: Deep Tissue Injury - Purple or maroon localized area of discolored intact skin or blood-filled blister due to damage of underlying soft tissue from pressure and/or shear.  Wound Description (Comments): evolving into stage 2 in patchy areas when assessed on 8/2  Present on Admission: Yes     Nutrition Problem: Increased nutrient needs Etiology: chronic illness, cancer and cancer related treatments   FTT: she is declined by CIR, snf placement recommended, no bed, likely will need to finish abx here, then home  DVT Prophylaxis:lovenox   Code Status: full  Family Communication: patient   Disposition Plan:  Patient declined my offer to call her family No snf bed yet, not safe to go home as left arm nonweight bearing, right arm + picc line Daughter has cerebral palsy  May have to finish abx treatment here, then remove picc line, then home with home health if still no snf offer She reports hopefully she can have a ramp built in two weeks to help her get in and out of the house    Consultants:  Oncology  Critical care  Ortho  Infectious disease  Procedures:  OPEN REDUCTION INTERNAL FIXATION (ORIF) HUMERAL FRACTURE on 8/14  Intubation/extubation  Single lumen picc line placement on 8/4  Antibiotics:  IV Ancef from 7/31---8/27   Objective: BP (!) 103/46 (BP Location: Left Arm)    Pulse 93    Temp 99.3 F (37.4 C) (Oral)    Resp 20    Ht 5\' 3"  (1.6 m)    Wt 83.2 kg    LMP 05/28/2013 (Within Months)    SpO2 100%    BMI 32.51 kg/m   Intake/Output Summary (Last 24 hours) at 01/18/2019 0916 Last data filed at 01/18/2019 0849 Gross per 24 hour  Intake 1279.8  ml  Output 1700 ml  Net -420.2 ml   Filed Weights   01/15/19 0500 01/16/19 0651 01/17/19 0437  Weight: 79.2 kg 80.1 kg 83.2 kg    Exam: Patient is examined daily including today on 01/18/2019, exams remain the same as of yesterday except that has changed    General:  Frail, NAD, left arm in sling, picc line in right arm  Cardiovascular: RRR  Respiratory: diminished at basis, no wheezing , no rales  Abdomen: Soft/ND/NT, positive BS  Musculoskeletal: No Edema  Neuro: alert, oriented   Data Reviewed: Basic Metabolic Panel: Recent Labs  Lab 01/13/19 0500 01/14/19 0905 01/15/19 0500 01/16/19 0624 01/17/19 0500  NA 137 136 136 139 137  K 3.9 2.9* 3.2* 3.5 3.4*  CL 100 97* 100 103 101  CO2 29 28 28 29 28   GLUCOSE 101* 109* 116* 117* 110*  BUN 6 11 12 13 16   CREATININE 0.34* 0.39* 0.40* 0.42* 0.38*  CALCIUM 5.2* 6.7* 6.5* 7.1* 7.3*  MG  --   --  1.9  --  2.2   Liver Function Tests: Recent Labs  Lab 01/11/19 1036 01/14/19 0905  AST 15 13*  ALT 8 6  ALKPHOS 213* 201*  BILITOT 0.6 0.6  PROT 6.3* 6.5  ALBUMIN 2.9* 2.7*   No results for input(s): LIPASE, AMYLASE in the last 168 hours. No results for input(s): AMMONIA in the last 168 hours. CBC: Recent Labs  Lab 01/12/19 0415 01/13/19 0500 01/17/19 0500  WBC 8.5 8.0 8.3  NEUTROABS  --   --  5.5  HGB 8.8* 8.9* 8.3*  HCT 29.7* 29.1* 27.9*  MCV 106.5* 106.6* 108.6*  PLT 314 326 436*   Cardiac Enzymes:   No results for input(s): CKTOTAL, CKMB, CKMBINDEX, TROPONINI in the last 168 hours. BNP (last 3 results) Recent Labs    10/26/18 1921 11/14/18 1621 12/25/18 1009  BNP 48.6 37.5 89.0    ProBNP (last 3 results) No results for input(s): PROBNP in the last 8760 hours.  CBG: No results for input(s): GLUCAP in the last 168 hours.  Recent Results (from the past 240 hour(s))  Surgical pcr screen     Status: Abnormal   Collection Time: 01/09/19  1:49 PM   Specimen: Nasal Mucosa; Nasal Swab  Result Value  Ref Range Status   MRSA, PCR NEGATIVE NEGATIVE Final   Staphylococcus aureus POSITIVE (A) NEGATIVE Final    Comment: (NOTE) The Xpert SA Assay (FDA approved for NASAL specimens in patients 73 years of age and older), is one component of a comprehensive surveillance program. It is not intended to diagnose infection nor to guide or monitor treatment. Performed at [facility], [place] [address], [place], [place] [address]   Novel Coronavirus, NAA (hospital order; send-out to ref lab)  Status: None   Collection Time: 01/14/19 12:42 PM   Specimen: Nasopharyngeal Swab; Respiratory  Result Value Ref Range Status   SARS-CoV-2, NAA NOT DETECTED NOT DETECTED Final    Comment: (NOTE) This test was developed and its performance characteristics determined by [employer]. This test has not been FDA cleared or approved. This test has been authorized by FDA under an Emergency Use Authorization (EUA). This test is only authorized for the duration of time the declaration that circumstances exist justifying the authorization of the emergency use of in vitro diagnostic tests for detection of SARS-CoV-2 virus and/or diagnosis of COVID-19 infection under section 564(b)(1) of the Act, 21 U.S.C. [id number]), unless the authorization is terminated or revoked sooner. When diagnostic testing is negative, the possibility of a false negative result should be considered in the context of a patient's recent exposures and the presence of clinical signs and symptoms consistent with COVID-19. An individual without symptoms of COVID-19 and who is not shedding SARS-CoV-2 virus would expect to have a negative (not detected) result in this assay. Performed  At: [facility] [place], [place] [id number] Rush Farmer MD [id number]    [place]  Final    Comment: Performed at [facility] [address]  [place], [place] [address]  Culture, Urine     Status: Abnormal   Collection Time: 01/15/19 11:50 AM   Specimen: Urine, Random  Result Value Ref Range Status   Specimen Description   Final    URINE, RANDOM Performed at [facility] [address] [place], [place] [address]    Special Requests   Final    NONE Performed at [facility], [place] [address] [place], [place] [address]    Culture 10,000 COLONIES/mL ENTEROBACTER ASBURIAE (A)  Final   Report Status 01/17/2019 FINAL  Final   Organism ID, Bacteria ENTEROBACTER ASBURIAE (A)  Final      Susceptibility   Enterobacter asburiae - MIC*    CEFAZOLIN >=64 RESISTANT Resistant     CEFTRIAXONE >=64 RESISTANT Resistant     CIPROFLOXACIN <=0.25 SENSITIVE Sensitive     GENTAMICIN <=1 SENSITIVE Sensitive     IMIPENEM <=0.25 SENSITIVE Sensitive     NITROFURANTOIN 64 INTERMEDIATE Intermediate     TRIMETH/SULFA <=20 SENSITIVE Sensitive     PIP/TAZO >=128 RESISTANT Resistant     * 10,000 COLONIES/mL ENTEROBACTER ASBURIAE     Studies: Dg Chest Port 1 View  Result Date: 01/17/2019 CLINICAL DATA:  Shortness of breath, cough, metastatic breast cancer, malignant pleural effusion EXAM: PORTABLE CHEST 1 VIEW COMPARISON:  01/13/2019 FINDINGS: No significant change in AP portable examination with large right, moderate left pleural effusions and associated atelectasis or consolidation. No new airspace opacity. The heart and mediastinum are predominantly obscured. Extensive sclerotic osseous metastatic disease with plate and screw fixation of the proximal left humerus. Right upper extremity PICC. IMPRESSION: No significant change in AP portable examination with large right, moderate left pleural effusions and associated atelectasis or consolidation. No new airspace opacity. The heart and mediastinum are predominantly obscured. Extensive sclerotic osseous metastatic disease with plate and screw fixation of the proximal left  humerus. Right upper extremity PICC. Electronically Signed   By: Eddie Candle M.D.   On: 01/17/2019 14:42    Scheduled Meds:  anastrozole  1 mg Oral Daily   carvedilol  3.125 mg Oral BID WC   chlorhexidine  15 mL Mouth Rinse BID   Chlorhexidine Gluconate Cloth  6 each Topical Daily   [START ON 01/19/2019] enoxaparin (LOVENOX) injection  40 mg Subcutaneous Q24H   feeding supplement  1 Container Oral TID BM   feeding supplement (ENSURE ENLIVE)  237 mL Oral BID BM   furosemide  20 mg Intravenous BID   guaiFENesin  600 mg Oral BID   ipratropium-albuterol  3 mL Nebulization BID   mouth rinse  15 mL Mouth Rinse q12n4p   multivitamin with minerals  1 tablet Oral Daily   nystatin   Topical TID   sodium chloride flush  3 mL Intravenous Q12H    Continuous Infusions:  sodium chloride 250 mL (01/12/19 1304)   sodium chloride Stopped (01/13/19 2144)    ceFAZolin (ANCEF) IV 2 g (01/18/19 0456)     Time spent: 50mins I have personally reviewed and interpreted on  01/18/2019 daily labs, imagings as discussed above under date review session and assessment and plans.  I reviewed all nursing notes, pharmacy notes, consultant notes,  vitals, pertinent old records  I have discussed plan of care as described above with RN , patient on 01/18/2019   Florencia Reasons MD, PhD, FACP  [facility] Pager [phone]. If 7PM-7AM, please contact night-coverage at [email or web address], password [facility] 01/18/2019, 9:16 AM  LOS: 24 days

## 2019-01-18 NOTE — Progress Notes (Signed)
pts BP 103/46. MD notified. Awaiting new orders. Will continue to monitor.

## 2019-01-18 NOTE — Progress Notes (Signed)
Suzanne [name]   DOB:Aug 31, 1960   IE#:[id number]   ZYS#:[id number]  Subjective:  Suzanne [name] did not get OOB yesterday. She says w PT she was able to take some steps outside the room. She is still very emotional--"being here all this time is getting to me;" she is pleased with the L arm surgery and has no pain there anymore. No family in room  Objective: morbidly obese White woman examined in bed Vitals:   01/18/19 0556 01/18/19 0803  BP: (!) 103/46   Pulse: 93   Resp: 20   Temp: 99.3 F (37.4 C)   SpO2: 100% 100%    Body mass index is 32.51 kg/m.  Intake/Output Summary (Last 24 hours) at 01/18/2019 0851 Last data filed at 01/18/2019 0558 Gross per 24 hour  Intake 1519.8 ml  Output 1700 ml  Net -180.2 ml   LUE in sling, good L hand grip  CBG (last 3)  No results for input(s): GLUCAP in the last 72 hours.   Labs:  Lab Results  Component Value Date   WBC 8.3 01/17/2019   HGB 8.3 (L) 01/17/2019   HCT 27.9 (L) 01/17/2019   MCV 108.6 (H) 01/17/2019   PLT 436 (H) 01/17/2019   NEUTROABS 5.5 01/17/2019    '@LASTCHEMISTRY' @  Urine Studies No results for input(s): UHGB, CRYS in the last 72 hours.  Invalid input(s): UACOL, UAPR, USPG, UPH, UTP, UGL, UKET, UBIL, UNIT, UROB, [place], UEPI, UWBC, [place], [place], [place], [place], [place]  Basic Metabolic Panel: Recent Labs  Lab 01/13/19 0500 01/14/19 0905 01/15/19 0500 01/16/19 0624 01/17/19 0500  NA 137 136 136 139 137  K 3.9 2.9* 3.2* 3.5 3.4*  CL 100 97* 100 103 101  CO2 '29 28 28 29 28  ' GLUCOSE 101* 109* 116* 117* 110*  BUN '6 11 12 13 16  ' CREATININE 0.34* 0.39* 0.40* 0.42* 0.38*  CALCIUM 5.2* 6.7* 6.5* 7.1* 7.3*  MG  --   --  1.9  --  2.2   GFR Estimated Creatinine Clearance: 76.4 mL/min (A) (by C-G formula based on SCr of 0.38 mg/dL (L)). Liver Function Tests: Recent Labs  Lab 01/11/19 1036 01/14/19 0905  AST 15 13*  ALT 8 6  ALKPHOS 213* 201*  BILITOT 0.6 0.6  PROT 6.3* 6.5  ALBUMIN 2.9* 2.7*   No results for input(s):  LIPASE, AMYLASE in the last 168 hours. No results for input(s): AMMONIA in the last 168 hours. Coagulation profile No results for input(s): INR, PROTIME in the last 168 hours.  CBC: Recent Labs  Lab 01/12/19 0415 01/13/19 0500 01/17/19 0500  WBC 8.5 8.0 8.3  NEUTROABS  --   --  5.5  HGB 8.8* 8.9* 8.3*  HCT 29.7* 29.1* 27.9*  MCV 106.5* 106.6* 108.6*  PLT 314 326 436*   Cardiac Enzymes: No results for input(s): CKTOTAL, CKMB, CKMBINDEX, TROPONINI in the last 168 hours. BNP: Invalid input(s): POCBNP CBG: No results for input(s): GLUCAP in the last 168 hours. D-Dimer No results for input(s): DDIMER in the last 72 hours. Hgb A1c No results for input(s): HGBA1C in the last 72 hours. Lipid Profile No results for input(s): CHOL, HDL, LDLCALC, TRIG, CHOLHDL, LDLDIRECT in the last 72 hours. Thyroid function studies No results for input(s): TSH, T4TOTAL, T3FREE, THYROIDAB in the last 72 hours.  Invalid input(s): FREET3 Anemia work up No results for input(s): VITAMINB12, FOLATE, FERRITIN, TIBC, IRON, RETICCTPCT in the last 72 hours. Microbiology Recent Results (from the past 240 hour(s))  Surgical pcr screen  Status: Abnormal   Collection Time: 01/09/19  1:49 PM   Specimen: Nasal Mucosa; Nasal Swab  Result Value Ref Range Status   MRSA, PCR NEGATIVE NEGATIVE Final   Staphylococcus aureus POSITIVE (A) NEGATIVE Final    Comment: (NOTE) The Xpert SA Assay (FDA approved for NASAL specimens in patients 27 years of age and older), is one component of a comprehensive surveillance program. It is not intended to diagnose infection nor to guide or monitor treatment. Performed at [facility], [place] [address], [place], [place] [address]   Novel Coronavirus, NAA (hospital order; send-out to ref lab)     Status: None   Collection Time: 01/14/19 12:42 PM   Specimen: Nasopharyngeal Swab; Respiratory  Result Value Ref Range Status   SARS-CoV-2, NAA NOT DETECTED NOT  DETECTED Final    Comment: (NOTE) This test was developed and its performance characteristics determined by [employer]. This test has not been FDA cleared or approved. This test has been authorized by FDA under an Emergency Use Authorization (EUA). This test is only authorized for the duration of time the declaration that circumstances exist justifying the authorization of the emergency use of in vitro diagnostic tests for detection of SARS-CoV-2 virus and/or diagnosis of COVID-19 infection under section 564(b)(1) of the Act, 21 U.S.C. 267TIW-5(Y)(0), unless the authorization is terminated or revoked sooner. When diagnostic testing is negative, the possibility of a false negative result should be considered in the context of a patient's recent exposures and the presence of clinical signs and symptoms consistent with COVID-19. An individual without symptoms of COVID-19 and who is not shedding SARS-CoV-2 virus would expect to have a negative (not detected) result in this assay. Performed  At: [facility] [place], [place] [id number] Rush Farmer MD NL:[id number]    [place]  Final    Comment: Performed at [place] [address] [place], [place] [address]  Culture, Urine     Status: Abnormal   Collection Time: 01/15/19 11:50 AM   Specimen: Urine, Random  Result Value Ref Range Status   Specimen Description   Final    URINE, RANDOM Performed at [facility] [address], [place], [place] [address]    Special Requests   Final    NONE Performed at [facility], [place] [address], [place],  [address]    Culture 10,000 COLONIES/mL ENTEROBACTER ASBURIAE (A)  Final   Report Status 01/17/2019 FINAL  Final   Organism ID, Bacteria ENTEROBACTER ASBURIAE (A)  Final      Susceptibility   Enterobacter asburiae - MIC*    CEFAZOLIN >=64 RESISTANT Resistant      CEFTRIAXONE >=64 RESISTANT Resistant     CIPROFLOXACIN <=0.25 SENSITIVE Sensitive     GENTAMICIN <=1 SENSITIVE Sensitive     IMIPENEM <=0.25 SENSITIVE Sensitive     NITROFURANTOIN 64 INTERMEDIATE Intermediate     TRIMETH/SULFA <=20 SENSITIVE Sensitive     PIP/TAZO >=128 RESISTANT Resistant     * 10,000 COLONIES/mL ENTEROBACTER ASBURIAE      Studies:  Dg Chest Port 1 View  Result Date: 01/17/2019 CLINICAL DATA:  Shortness of breath, cough, metastatic breast cancer, malignant pleural effusion EXAM: PORTABLE CHEST 1 VIEW COMPARISON:  01/13/2019 FINDINGS: No significant change in AP portable examination with large right, moderate left pleural effusions and associated atelectasis or consolidation. No new airspace opacity. The heart and mediastinum are predominantly obscured. Extensive sclerotic osseous metastatic disease with plate and  screw fixation of the proximal left humerus. Right upper extremity PICC. IMPRESSION: No significant change in AP portable examination with large right, moderate left pleural effusions and associated atelectasis or consolidation. No new airspace opacity. The heart and mediastinum are predominantly obscured. Extensive sclerotic osseous metastatic disease with plate and screw fixation of the proximal left humerus. Right upper extremity PICC. Electronically Signed   By: Eddie Candle M.D.   On: 01/17/2019 14:42    Assessment: 58 y.o. [place] woman presenting 10/26/2018 with right-sided inflammatory breast cancer, stage IV, involving lungs, lymph nodes and bones, as follows:             (a) chest CT scan 10/26/2018 shows bilateral pleural effusions, possible lymphangitic spread of tumor, diffuse bony metastatic disease, and significant axillary mediastinal and hilar adenopathy             (b) bone scan 10/27/2018 is a "near [place]" consistent with widespread bony metastatic disease             (c) head CT with and without contrast 10/30/2018 shows no intracranial  metastatic disease, multiple calvarial lesions             (d) CA-27-29 on 10/27/2018 was 1810.4  (1) pleural fluid from right thoracentesis 10/28/2018 confirms malignant cells consistent with a breast primary, strongly estrogen and progesterone receptor positive, HER-2 not amplified, with an MIB-1 of 2%  (2) anastrozole started 10/29/2018             (a) palbociclib to resume 01/22/2019             (b) denosumab/xgeva to resume as outpatient  (3) associated problems:             (a) hypoxia secondary to effusions, deconditioning, morbid obesity             (b) pain from bone lesions             (c) right upper extremity lymphedema             (d) anemia, requiring transfusion             (e) poor venous access  (f) poor social situation  (4) genetics testing pending   Plan:  Elnor continues to improve by minimal but steady steps. She is tolerating anastrozole well and we will resume palbociclib once her ABX course is completed.  She still does not have Medicaid. This is complicating discharge planning.  I have encouraged her to make the most of PT, get OOBTC w assistance as much as possible.  We are trying to get a ramp constructed so she can get in and out of her home. We are also in close contact w her daughter (who has CP but is very independent). Unfortunately she is only marginally able to help with patient's care at home.  Greatly appreciate your help to this patient.   Chauncey Cruel, MD 01/18/2019  8:51 AM Medical Oncology and Hematology [facility] [address] [place], [place] [address] Tel. [phone]    Fax. [phone]

## 2019-01-18 NOTE — Progress Notes (Signed)
This Probation officer spoke to pt regarding wearing nocturnal bipap tonight.  Pt became upset and tearful.  Pt stated she doesn't want to wear it because it scares her, and she's coughing a lot.  Pt was encouraged to call should she change her mind.

## 2019-01-19 LAB — BASIC METABOLIC PANEL
Anion gap: 10 (ref 5–15)
BUN: 15 mg/dL (ref 6–20)
CO2: 30 mmol/L (ref 22–32)
Calcium: 7.5 mg/dL — ABNORMAL LOW (ref 8.9–10.3)
Chloride: 99 mmol/L (ref 98–111)
Creatinine, Ser: 0.4 mg/dL — ABNORMAL LOW (ref 0.44–1.00)
GFR calc Af Amer: >60 mL/min (ref >60)
GFR calc non Af Amer: >60 mL/min (ref >60)
Glucose, Bld: 112 mg/dL — ABNORMAL HIGH (ref 70–99)
Potassium: 3.1 mmol/L — ABNORMAL LOW (ref 3.5–5.1)
Sodium: 139 mmol/L (ref 135–145)

## 2019-01-19 LAB — CBC WITH DIFFERENTIAL/PLATELET
Abs Immature Granulocytes: 0.04 10*3/uL (ref 0.00–0.07)
Basophils Absolute: 0 10*3/uL (ref 0.0–0.1)
Basophils Relative: 1 %
Eosinophils Absolute: 0.2 10*3/uL (ref 0.0–0.5)
Eosinophils Relative: 3 %
HCT: 27 % — ABNORMAL LOW (ref 36.0–46.0)
Hemoglobin: 8.1 g/dL — ABNORMAL LOW (ref 12.0–15.0)
Immature Granulocytes: 1 %
Lymphocytes Relative: 26 %
Lymphs Abs: 2 10*3/uL (ref 0.7–4.0)
MCH: 32.5 pg (ref 26.0–34.0)
MCHC: 30 g/dL (ref 30.0–36.0)
MCV: 108.4 fL — ABNORMAL HIGH (ref 80.0–100.0)
Monocytes Absolute: 0.6 10*3/uL (ref 0.1–1.0)
Monocytes Relative: 7 %
Neutro Abs: 4.7 10*3/uL (ref 1.7–7.7)
Neutrophils Relative %: 62 %
Platelets: 432 10*3/uL — ABNORMAL HIGH (ref 150–400)
RBC: 2.49 MIL/uL — ABNORMAL LOW (ref 3.87–5.11)
RDW: 21.1 % — ABNORMAL HIGH (ref 11.5–15.5)
WBC: 7.6 10*3/uL (ref 4.0–10.5)
nRBC: 0 % (ref 0.0–0.2)

## 2019-01-19 MED ORDER — FUROSEMIDE 10 MG/ML IJ SOLN
20.0000 mg | Freq: Every day | INTRAMUSCULAR | Status: DC
  Administered 2019-01-20: 20 mg via INTRAVENOUS
  Filled 2019-01-19: qty 2

## 2019-01-19 MED ORDER — FENTANYL CITRATE (PF) 100 MCG/2ML IJ SOLN: 25.0000 ug | INTRAMUSCULAR | Status: DC | PRN

## 2019-01-19 MED ORDER — POTASSIUM CHLORIDE CRYS ER 20 MEQ PO TBCR
40.0000 meq | EXTENDED_RELEASE_TABLET | Freq: Three times a day (TID) | ORAL | Status: AC
  Administered 2019-01-19 – 2019-01-20 (×3): 40 meq via ORAL
  Filled 2019-01-19 (×3): qty 2

## 2019-01-19 MED ORDER — PANTOPRAZOLE SODIUM 40 MG IV SOLR
40.0000 mg | INTRAVENOUS | Status: DC
  Administered 2019-01-19 – 2019-01-23 (×5): 40 mg via INTRAVENOUS
  Filled 2019-01-19 (×5): qty 40

## 2019-01-19 NOTE — Progress Notes (Signed)
This shift pt refused BIPAP, says she will do better with just nasal cannula. Advised to let me know if she changes her mind. Much less anxious overnight without BIPAP. Will continue to monitor

## 2019-01-19 NOTE — Progress Notes (Signed)
  Speech Language Pathology Treatment: Dysphagia  Patient Details Name: Suzanne [name] MRN: [id number] DOB: [birth year] Today's Date: 01/19/2019 Time: [id number] SLP Time Calculation (min) (ACUTE ONLY): 34 min  Assessment / Plan / Recommendation Clinical Impression  Pt today states her swallowing and voice have significantly improved.  She appears marginally hoarse still but making great progress.    SLP therapeutic session to focus on RMST took place.  Pt again states "I may be doing this wrong, it feels too easy".  SLP modifed cmH20pressure to 9 with pt reporting level 5 effort over 15 repetitions.  Anticipate conducting exercise TID, will increase difficulty to level 7 - which is target goal. Educated pt to plan using teach back and min cues to perform exercise adequately.    Pt is to have a thoracentesis thus advised she not conduct RMST after her thoracentesis and await approval from MD before starting again.  Provided pt the question in writing to present during her [place] for guidance as she is motivated with RMST.    Today she did not pass the 3 ounce yale water test - requiring rest break after 2 ounces - followed by cough.    After SlP left room and pt had finished her meal, she was helped back to bed.  Following this she demonstrated excessive coughing.  Pt reports coughing up phelgm at night after lying down.  She also reports 2 episodes of coughing and regurgitating liquids she had consumed several hours before - occuring twice in 3 weeks.  Question if she could have some component of GERD- Pt uncertain but this may contribute to aspiration concerns.  Advised her to precautions.   Will follow up for dysphagia management, RMST for improved voice and cough.  HPI HPI: Pt admitted with acute on chronic respiratory failure and hx of breast CA with mets to bone (currently with L UE in sling 2* pathologic fx) and lungs, chronic diastolic heart failure, anemia and pleural effusion. Pt now s/p LUE  ORIF 8/14. She was extubated post op but developed respiratory distress with tachypnea, hypoxia, respiratory acidosis, ?pulmonary edema and was reintubated in PACU. Extubated 8/16. Per PCCM, has had shallow breathing due to elevated right hemidiaphragm and effusions; gradually weaning off BiPAP.Pt has been off of Bipap and tolerating well.  SLP visit to determine readiness for dietary advancement and to educate pt to recommendations.    Pt reports being excessively careful with eating most notably with particulate foods and medicine.Marland Kitchen      SLP Plan  Continue with current plan of care       Recommendations  Liquids provided via: Cup;Straw Medication Administration: Whole meds with puree Supervision: Patient able to self feed Compensations: Slow rate;Small sips/bites Postural Changes and/or Swallow Maneuvers: Seated upright 90 degrees;Upright 30-60 min after meal                Oral Care Recommendations: Oral care BID Follow up Recommendations: Skilled Nursing facility SLP Visit Diagnosis: Dysphagia, unspecified (R13.10) Plan: Continue with current plan of care       GO                Suzanne [name] 01/19/2019, 7:11 PM   Suzanne [name], [facility] SLP [place] Pager [phone] Office [phone]

## 2019-01-19 NOTE — Progress Notes (Signed)
Physical Therapy Treatment Patient Details Name: Suzanne [name] MRN: [id number] DOB: 07/28/1960 Today's Date: 01/19/2019    History of Present Illness Pt admitted with acute on chronic respiratory failure and hx of breast CA with mets to bone (currently with L UE in sling 2* pathologic fx) and lungs, chronic diastolic heart failure, anemia and pleural effusion. Pt now s/p LUE ORIF 8/14 and off vent 8/16    PT Comments    Pt making improvement with bed mobility.  Her o2 sat was 100% on 2 L/min with gait. Con't to recommend SNF.   Follow Up Recommendations  SNF     Equipment Recommendations  None recommended by PT    Recommendations for Other Services       Precautions / Restrictions Precautions Precautions: Fall;Shoulder Restrictions LUE Weight Bearing: Non weight bearing    Mobility  Bed Mobility Overal bed mobility: Needs Assistance Bed Mobility: Supine to Sit     Supine to sit: Min assist;HOB elevated     General bed mobility comments: MIN A for trunk  Transfers Overall transfer level: Needs assistance Equipment used: Quad cane Transfers: Sit to/from Stand Sit to Stand: [employer] safety/equipment         General transfer comment: MIN A to power up  Ambulation/Gait Ambulation/Gait assistance: Min assist;+2 safety/equipment Gait Distance (Feet): 20 Feet(plus 10') Assistive device: Quad cane Gait Pattern/deviations: Decreased step length - right;Decreased step length - left;Trunk flexed Gait velocity: decr   General Gait Details: cues to look up and for sequencing with quad cane.  Recliner follow and sitting rest break between ambulation. Amb on 2 L/min with o2 100% during gait   Stairs             Wheelchair Mobility    Modified Rankin (Stroke Patients Only)       Balance Overall balance assessment: Needs assistance Sitting-balance support: No upper extremity supported;Feet supported Sitting balance-Leahy Scale: Good     Standing  balance support: Single extremity supported Standing balance-Leahy Scale: Poor                              Cognition Arousal/Alertness: Awake/alert Behavior During Therapy: WFL for tasks assessed/performed Overall Cognitive Status: Within Functional Limits for tasks assessed                                        Exercises      General Comments        Pertinent Vitals/Pain Faces Pain Scale: Hurts little more Pain Location: low back and R hip Pain Descriptors / Indicators: Sore Pain Intervention(s): Limited activity within patient's tolerance;Monitored during session;Repositioned    Home Living                      Prior Function            PT Goals (current goals can now be found in the care plan section) Acute Rehab PT Goals Patient Stated Goal: Short term rehab Progress towards PT goals: Progressing toward goals;Goals met and updated - see care plan    Frequency    Min 3X/week      PT Plan Current plan remains appropriate    Co-evaluation              AM-PAC PT "6 Clicks" Mobility   Outcome Measure  Help needed turning from your back to your side while in a flat bed without using bedrails?: A Lot Help needed moving from lying on your back to sitting on the side of a flat bed without using bedrails?: A Lot Help needed moving to and from a bed to a chair (including a wheelchair)?: A Little Help needed standing up from a chair using your arms (e.g., wheelchair or bedside chair)?: A Little Help needed to walk in hospital room?: A Little Help needed climbing 3-5 steps with a railing? : A Lot 6 Click Score: 15    End of Session Equipment Utilized During Treatment: Gait belt;Oxygen Activity Tolerance: Patient tolerated treatment well Patient left: in chair;with call bell/phone within reach   PT Visit Diagnosis: Difficulty in walking, not elsewhere classified (R26.2);Unsteadiness on feet (R26.81)     Time:  8265-8718 PT Time Calculation (min) (ACUTE ONLY): 20 min  Charges:  $Gait Training: 8-22 mins                     Ewing Fandino L. Tamala Julian, [place] Pager [phone] 01/19/2019    Galen Manila 01/19/2019, 1:08 PM

## 2019-01-19 NOTE — Progress Notes (Signed)
Pt refused BiPAP qhs.  Pt encouraged to contact RT should she change her mind.  BiPAP remains in room in case Pt changes her mind.

## 2019-01-19 NOTE — Progress Notes (Addendum)
PROGRESS NOTE  Suzanne [name] [id number] DOB: 1960-10-11 DOA: 12/25/2018 PCP: Patient, No Pcp Per  Brief Narrative: 58 year old with past medical history significant for stage IV breast cancer with known metastasis to bone, lung and lymph nodes and malignant pleural effusion admitted on 12/25/2018 with worsening shortness of breath and altered mental status.  Of note patient was diagnosed with metastatic breast cancer in early June 2020.  At the time of diagnosis she was found to have widespread metastatic disease and pathologic fracture in her right right clavicle.  She subsequently underwent neoadjuvant chemotherapy but has also had her treatment complicated by recurrent malignant right-sided pleural effusion, neutropenia and thrombocytopenia.    In the emergency room patient was noted to be septic, likely secondary to sacral decubital ulcer, blood cultures on admission grew MSSA.  ID was consulted and recommendation was for 4 weeks of Ancef, which finish on 8/27.  Patient was also found to have a pathologic left humeral fracture.  Examination by orthopedic surgery recommended surgery this admission.   Patient underwent  left Humerus fracture open reduction and internal fixation using Bionmet ALPS plate on D34-534.  Post surgery patient develop acute respiratory distress and she was reintubated.  Patient was subsequently extubated on 8/15, she was placed on BiPAP.  Her respiratory status has been stable, on BiPAP at bedtime.  Respiratory failure was thought to be related to pulmonary edema and pleural effusion and chronic elevation of the right hemi-diaphragma. she was a started on IV Lasix .     HPI/Recap of past 24 hours: She is on bipap at night, nasal cannula during day time  she is currently on 2-3liters o2 at rest, denies chest pain, no sob at rest, no edema She is tolerating iv lasix and iv abx treatment    Assessment/Plan: Principal Problem:   Malignant pleural effusion Active  Problems:   Bone metastasis (HCC)   Acute on chronic respiratory failure with hypoxia (HCC)   Acute on chronic diastolic CHF (congestive heart failure) (HCC)   Bacteremia   FTT (failure to thrive) in adult   Carcinoma of breast, estrogen receptor positive, stage 4, right (HCC)   Fracture closed, humerus, shaft   Pleural effusion, bilateral   Pressure injury of skin   Pneumonia   Respiratory failure, acute (HCC)   Acute on chronic hypoxic respiratory failure, likely  Multifactorial including malignant pleural effusion and a/c diast CHF -Patient presented with worsening hypoxia and admitted on 12/25/18.  cxr with known bilateral pleural effusions on presentation.  cta on presentation was negative for PE -She has a known right malignant pleural effusion.  Last thoracentesis was 6/20.  Per chart review, there was some concern of possible lymphangitic spread of tumor in the lungs. -She was initially on IV Solu-Medrol which has been discontinued. -Patient had to be re-intubated post shoulder surgery 8/14, extubated on 8/15 -  repeat cxr on 8/22showed recurrent right pleural effusion,  IR consulted for right sided thoracentesis which has not been done yet ( awaiting repeat covid test per IR protocol before thoracentesis), fluids study ordered ( fluids study, culture , cell count, gram stain, cytology)   Acute on chronic diastolic chf:-continue Diuresis as long as bp and cr allows - getting Lasix 20 IV BID, she doesn't have a lot of edema on exam today > will decrease to 20 lasix IV daily - watch daily bmet - She reports had bilateral lower extremity edema for a month prior to hospitalization -currently on [facility] at [facility]  and PRN during day, nasal cannula day time -Continue with incentive spirometry, out of bed.    MSSA bacteremia/sepsis; -Sepsis present on admission.  Likely source a stage II/III sacral decubitus ulcer. -ID consultation was obtained. -Sacral wound did not require any surgical  intervention.. -TTE did not show any signs of endocarditis.. -Repeated blood cultures on 7/30 first did not show any growth -Per ID recommendation, patient is currently on 4 weeks course of Ancef until 8/27 - single lumen PICC line to right arm placed on 8/4   Bacteriuria :   Urine culture from 8/20 grew 10K colonies of ENTEROBACTER ASBURIAE  Denies urinary symptom, urine appear clear, no fever, no leukocytosis, cr wnl, will hold off abx Monitor    Hypocalcemia: Last dose denosumab on 7/16 Calcium nadir at 5.2 on 8/18, today is 6.5, corrected calcium above 7 Monitor and replace prn   Hypokalemia: Remain low, continue to replace , repeat in am,  mag 1.9  Anemia in the setting of malignancy -hgb 8.9   Stage IV metastatic breast cancer to bone and lung: -Oncology Dr Jana Hakim follow up appreciated.. - anastrozole, palbociclib and denosumab/xgeva per oncology  Rash on right breast, denies pain, not itchy Appear candida skin rash vs folliculitis Topical nystatin  Pathological left femur fractures: Noted on x-ray obtained on 7/25 as an outpatient for shoulder pain. Seen by radiation oncology.  Apparently patient had an appointment with orthopedic on 8/6 which she missed because she was in the hospital.  Seen by Dr. Veverly Fells with orthopedic who recommended surgery this admission. Dr. Maryland Pink discussed case with Dr. Jana Hakim and orthopedic doctor and decision was made to proceed with surgery this admission >> so underwent left Humerus fracture open reduction and internal fixation using Bionmet ALPS plate on D34-534 Has sling to arm, follow with ortho  Dr Veverly Fells in two weeks, if remain in the hospital , may ask Dr Veverly Fells to come to see patient while in the hospital  Elevated d-dimer: likely from active cancer, CT angio chest was negative for PE, Dopplers of lower extremity negative for DVT.  Elevated alkaline phosphatase: Secondary to metastatic bone disease.  HTN: coreg, lasix   Stage 2-3 decubitus ulcer sacrum present on admission Local care.  Pressure Injury 12/25/18 Sacrum Mid Deep Tissue Injury - Purple or maroon localized area of discolored intact skin or blood-filled blister due to damage of underlying soft tissue from pressure and/or shear. evolving into stage 2 in patchy areas when asse (Active)  12/25/18 1700  Location: Sacrum  Location Orientation: Mid  Staging: Deep Tissue Injury - Purple or maroon localized area of discolored intact skin or blood-filled blister due to damage of underlying soft tissue from pressure and/or shear.  Wound Description (Comments): evolving into stage 2 in patchy areas when assessed on 8/2  Present on Admission: Yes     Nutrition Problem: Increased nutrient needs Etiology: chronic illness, cancer and cancer related treatments   FTT: she is declined by CIR, snf placement recommended, no bed, likely will need to finish abx here, then home  DVT Prophylaxis:lovenox  Code Status: full Family Communication: patient  Disposition Plan:  Patient declined my offer to call her family No snf bed yet, not safe to go home as left arm nonweight bearing, right arm + picc line Daughter has cerebral palsy  May have to finish abx treatment here (8/27) then remove picc line, then home with home health if still no snf offer She reports hopefully she can have a ramp built  in two weeks to help her get in and out of the house    Kelly Splinter MD  [facility] [phone] 01/19/2019, 4:55 PM     Consultants:  Oncology  Critical care  Ortho  Infectious disease  Procedures:  OPEN REDUCTION INTERNAL FIXATION (ORIF) HUMERAL FRACTURE on 8/14  Intubation/extubation  Single lumen picc line placement on 8/4  Antibiotics:  IV Ancef from 7/31---8/27   Objective: BP 131/78 (BP Location: Right Leg)   Pulse 96   Temp 98.1 F (36.7 C)   Resp 19   Ht 5\' 3"  (1.6 m)   Wt 86.2 kg   LMP 05/28/2013 (Within Months)   SpO2 100%    BMI 33.66 kg/m   Intake/Output Summary (Last 24 hours) at 01/19/2019 1631 Last data filed at 01/19/2019 1500 Gross per 24 hour  Intake 980 ml  Output 400 ml  Net 580 ml   Filed Weights   01/16/19 0651 01/17/19 0437 01/19/19 0521  Weight: 80.1 kg 83.2 kg 86.2 kg    Exam: Patient is examined daily including today on 01/19/2019, exams remain the same as of yesterday except that has changed    General:  Frail, NAD, left arm in sling, picc line in right arm  Cardiovascular: RRR  Respiratory: diminished at basis, no wheezing , no rales  Abdomen: Soft/ND/NT, positive BS  Musculoskeletal: No Edema  Neuro: alert, oriented   Data Reviewed: Basic Metabolic Panel: Recent Labs  Lab 01/14/19 0905 01/15/19 0500 01/16/19 0624 01/17/19 0500 01/19/19 0431  NA 136 136 139 137 139  K 2.9* 3.2* 3.5 3.4* 3.1*  CL 97* 100 103 101 99  CO2 28 28 29 28 30   GLUCOSE 109* 116* 117* 110* 112*  BUN 11 12 13 16 15   CREATININE 0.39* 0.40* 0.42* 0.38* 0.40*  CALCIUM 6.7* 6.5* 7.1* 7.3* 7.5*  MG  --  1.9  --  2.2  --    Liver Function Tests: Recent Labs  Lab 01/14/19 0905  AST 13*  ALT 6  ALKPHOS 201*  BILITOT 0.6  PROT 6.5  ALBUMIN 2.7*   No results for input(s): LIPASE, AMYLASE in the last 168 hours. No results for input(s): AMMONIA in the last 168 hours. CBC: Recent Labs  Lab 01/13/19 0500 01/17/19 0500 01/19/19 0431  WBC 8.0 8.3 7.6  NEUTROABS  --  5.5 4.7  HGB 8.9* 8.3* 8.1*  HCT 29.1* 27.9* 27.0*  MCV 106.6* 108.6* 108.4*  PLT 326 436* 432*   Cardiac Enzymes:   No results for input(s): CKTOTAL, CKMB, CKMBINDEX, TROPONINI in the last 168 hours. BNP (last 3 results) Recent Labs    10/26/18 1921 11/14/18 1621 12/25/18 1009  BNP 48.6 37.5 89.0    ProBNP (last 3 results) No results for input(s): PROBNP in the last 8760 hours.  CBG: No results for input(s): GLUCAP in the last 168 hours.  Recent Results (from the past 240 hour(s))  Novel Coronavirus, NAA  (hospital order; send-out to ref lab)     Status: None   Collection Time: 01/14/19 12:42 PM   Specimen: Nasopharyngeal Swab; Respiratory  Result Value Ref Range Status   SARS-CoV-2, NAA NOT DETECTED NOT DETECTED Final    Comment: (NOTE) This test was developed and its performance characteristics determined by [employer]. This test has not been FDA cleared or approved. This test has been authorized by FDA under an Emergency Use Authorization (EUA). This test is only authorized for the duration of time the declaration that circumstances exist  justifying the authorization of the emergency use of in vitro diagnostic tests for detection of SARS-CoV-2 virus and/or diagnosis of COVID-19 infection under section 564(b)(1) of the Act, 21 U.S.C. KA:123727), unless the authorization is terminated or revoked sooner. When diagnostic testing is negative, the possibility of a false negative result should be considered in the context of a patient's recent exposures and the presence of clinical signs and symptoms consistent with COVID-19. An individual without symptoms of COVID-19 and who is not shedding SARS-CoV-2 virus would expect to have a negative (not detected) result in this assay. Performed  At: [facility] [place], [place] [id number] Rush Farmer MD [id number]    [place]  Final    Comment: Performed at [facility] [address] [place], [place] [address]  Culture, Urine     Status: Abnormal   Collection Time: 01/15/19 11:50 AM   Specimen: Urine, Random  Result Value Ref Range Status   Specimen Description   Final    URINE, RANDOM Performed at [facility] [address] [place], [place] [address]    Special Requests   Final    NONE Performed at [facility], [place] [address] [place], [place] [address]    Culture 10,000 COLONIES/mL ENTEROBACTER ASBURIAE (A)   Final   Report Status 01/17/2019 FINAL  Final   Organism ID, Bacteria ENTEROBACTER ASBURIAE (A)  Final      Susceptibility   Enterobacter asburiae - MIC*    CEFAZOLIN >=64 RESISTANT Resistant     CEFTRIAXONE >=64 RESISTANT Resistant     CIPROFLOXACIN <=0.25 SENSITIVE Sensitive     GENTAMICIN <=1 SENSITIVE Sensitive     IMIPENEM <=0.25 SENSITIVE Sensitive     NITROFURANTOIN 64 INTERMEDIATE Intermediate     TRIMETH/SULFA <=20 SENSITIVE Sensitive     PIP/TAZO >=128 RESISTANT Resistant     * 10,000 COLONIES/mL ENTEROBACTER ASBURIAE     Studies: No results found.  Scheduled Meds: . anastrozole  1 mg Oral Daily  . carvedilol  3.125 mg Oral BID WC  . chlorhexidine  15 mL Mouth Rinse BID  . Chlorhexidine Gluconate Cloth  6 each Topical Daily  . [START ON 01/20/2019] enoxaparin (LOVENOX) injection  40 mg Subcutaneous Q24H  . feeding supplement  1 Container Oral TID BM  . feeding supplement (ENSURE ENLIVE)  237 mL Oral BID BM  . furosemide  20 mg Intravenous BID  . guaiFENesin  600 mg Oral BID  . ipratropium-albuterol  3 mL Nebulization BID  . mouth rinse  15 mL Mouth Rinse q12n4p  . multivitamin with minerals  1 tablet Oral Daily  . nystatin   Topical TID  . sodium chloride flush  3 mL Intravenous Q12H    Continuous Infusions: . sodium chloride 250 mL (01/12/19 1304)  . sodium chloride Stopped (01/13/19 2144)  .  ceFAZolin (ANCEF) IV 2 g (01/19/19 1307)     Time spent: 53mins I have personally reviewed and interpreted on  01/19/2019 daily labs, imagings as discussed above under date review session and assessment and plans.  I reviewed all nursing notes, pharmacy notes, consultant notes,  vitals, pertinent old records  I have discussed plan of care as described above with RN , patient on 01/19/2019   Sol Blazing MD, PhD, FACP  [facility] Pager [phone]. If 7PM-7AM, please contact night-coverage at [email or web address], password [facility] 01/19/2019, 4:31 PM  LOS: 25 days

## 2019-01-20 ENCOUNTER — Inpatient Hospital Stay (HOSPITAL_COMMUNITY): Payer: Medicaid Other

## 2019-01-20 LAB — BASIC METABOLIC PANEL
Anion gap: 6 (ref 5–15)
BUN: 12 mg/dL (ref 6–20)
CO2: 30 mmol/L (ref 22–32)
Calcium: 7.6 mg/dL — ABNORMAL LOW (ref 8.9–10.3)
Chloride: 101 mmol/L (ref 98–111)
Creatinine, Ser: 0.31 mg/dL — ABNORMAL LOW (ref 0.44–1.00)
GFR calc Af Amer: >60 mL/min (ref >60)
GFR calc non Af Amer: >60 mL/min (ref >60)
Glucose, Bld: 117 mg/dL — ABNORMAL HIGH (ref 70–99)
Potassium: 3.6 mmol/L (ref 3.5–5.1)
Sodium: 137 mmol/L (ref 135–145)

## 2019-01-20 LAB — NOVEL CORONAVIRUS, NAA (HOSP ORDER, SEND-OUT TO REF LAB; TAT 18-24 HRS): SARS-CoV-2, NAA: NOT DETECTED

## 2019-01-20 MED ORDER — LIDOCAINE HCL 1 % IJ SOLN
INTRAMUSCULAR | Status: AC
  Filled 2019-01-20: qty 10

## 2019-01-20 NOTE — Procedures (Signed)
PROCEDURE SUMMARY:  Unsuccessful image-guided right thoracentesis. Unable to aspirate any pleural fluid using a 6 Fr Safe-T-Centesis.   On pre-procedure US examination there appears to be a minimal amount of fluid in the right pleural space that is thick without obvious septations.   Patient tolerated procedure poorly - she has significant pain when sitting up and is unable to lay on her left side for a right sided thoracentesis due to recent ORIF of the left humerus. I was unable to attempt aspiration of pleural fluid with alternative catheter as patient requested procedure be stopped due to significant pain with sitting upright.   EBL: Zero No immediate complications.  Post procedure CXR shows no pneumothorax.  Please see imaging section of Epic for full dictation.  Joaquim Nam PA-C 01/20/2019 3:37 PM

## 2019-01-20 NOTE — Progress Notes (Addendum)
PROGRESS NOTE  Suzanne [name] [id number] DOB: [birth year] DOA: 12/25/2018 PCP: Patient, No Pcp Per  Brief Narrative: 58 year old with past medical history significant for stage IV breast cancer with known metastasis to bone, lung and lymph nodes and malignant pleural effusion admitted on 12/25/2018 with worsening shortness of breath and altered mental status.  Of note patient was diagnosed with metastatic breast cancer in early June 2020.  At the time of diagnosis she was found to have widespread metastatic disease and pathologic fracture in her right right clavicle.  She subsequently underwent neoadjuvant chemotherapy but has also had her treatment complicated by recurrent malignant right-sided pleural effusion, neutropenia and thrombocytopenia.    In the emergency room patient was noted to be septic, likely secondary to sacral decubital ulcer, blood cultures on admission grew MSSA.  ID was consulted and recommendation was for 4 weeks of Ancef, which finish on 8/27.  Patient was also found to have a pathologic left humeral fracture.  Examination by orthopedic surgery recommended surgery this admission.   Patient underwent  left Humerus fracture open reduction and internal fixation using Bionmet ALPS plate on D34-534.  Post surgery patient develop acute respiratory distress and she was reintubated.  Patient was subsequently extubated on 8/15, she was placed on BiPAP.  Her respiratory status has been stable, on BiPAP at bedtime.  Respiratory failure was thought to be related to pulmonary edema and pleural effusion and chronic elevation of the right hemi-diaphragma. she was a started on IV Lasix .     HPI/Recap of past 24 hours: Pt went to IR for thoracentesis but US showed minimal fluid, they "tried anyway" per the patient and got just a little bit of fluid.     Assessment/Plan: Principal Problem:   Malignant pleural effusion Active Problems:   Bone metastasis (HCC)   Acute on chronic  respiratory failure with hypoxia (HCC)   Acute on chronic diastolic CHF (congestive heart failure) (HCC)   Bacteremia   FTT (failure to thrive) in adult   Carcinoma of breast, estrogen receptor positive, stage 4, right (HCC)   Fracture closed, humerus, shaft   Pleural effusion, bilateral   Pressure injury of skin   Pneumonia   Respiratory failure, acute (HCC)   Acute on chronic hypoxic respiratory failure, likely  Multifactorial including malignant pleural effusion and a/c diast CHF -Patient presented with worsening hypoxia and admitted on 12/25/18.  cxr with known bilateral pleural effusions on presentation.  cta on presentation was negative for PE -She has a known right malignant pleural effusion.  Last thoracentesis was 6/20.  Per chart review, there was some concern of possible lymphangitic spread of tumor in the lungs. -She was initially on IV Solu-Medrol which has been discontinued. -Patient had to be re-intubated post shoulder surgery 8/14, extubated on 8/15 -  repeat cxr on 8/22showed recurrent right pleural effusion,  IR consulted for right sided thoracentesis > pt went to IR TODAY where [place] only showed minimal fluid and they tried to aspirate some but the procedure was difficult because of her L arm surg she couldn't lie on the L side , so they had to abort. Not sure yet if any fluid was sent off >  From this we must assume that the RLL xray changes are related to [facility] +/- atelectasis. No septations were seen by [place] today.  - discussed DISPOSITION with patient today, by her account they have tried to find a rehab facility but nobody offered except one in [place], and she  says  "I am not going to [facility]".  She is not sure if Medicaid has come through.  She says her people are getting the wood "maybe tomorrow" to build the ramp at home.  - per chart the most recent likely plan is to keep pt here to complete her Abx course through 8/27 (Thursday), then to dc home hopefully  w/ a ramp in place   Stage IV metastatic breast cancer to bone and lung: -Oncology Dr Jana Hakim follow up appreciated.. - anastrozole, palbociclib and denosumab/xgeva per oncology   Acute on chronic diastolic chf:- -currently on BIPAP at HS and PRN during day, nasal cannula day time -Continue with incentive spirometry, out of bed.  - she has had many days of IV lasix 20 bid, decreased yest to 20 mg IV qd and will dc today - no sig vol overload on exam at this point  MSSA bacteremia/sepsis; -Sepsis present on admission.  Likely source a stage II/III sacral decubitus ulcer. -ID consultation was obtained. -Sacral wound did not require any surgical intervention.. -TTE did not show any signs of endocarditis.. -Repeated blood cultures on 7/30 first did not show any growth -Per ID recommendation, patient is currently on 4 weeks course of Ancef until 8/27 - single lumen PICC line to right arm placed on 8/4   Bacteriuria :   Urine culture from 8/20 grew 10K colonies of ENTEROBACTER ASBURIAE  Denies urinary symptom, urine appear clear, no fever, no leukocytosis, cr wnl, will hold off abx Monitor    Hypocalcemia: Last dose denosumab on 7/16 Calcium nadir at 5.2 on 8/18, today is 6.5, corrected calcium above 7 Monitor and replace prn   Hypokalemia: Remain low, continue to replace , repeat in am,  mag 1.9  Anemia in the setting of malignancy -hgb 8.9   Rash on right breast, denies pain, not itchy Appear candida skin rash vs folliculitis Topical nystatin  Pathological left femur fractures: Noted on x-ray obtained on 7/25 as an outpatient for shoulder pain. Seen by radiation oncology.  Apparently patient had an appointment with orthopedic on 8/6 which she missed because she was in the hospital.  Seen by Dr. Veverly Fells with orthopedic who recommended surgery this admission. Dr. Maryland Pink discussed case with Dr. Jana Hakim and orthopedic doctor and decision was made to proceed with surgery  this admission >> so underwent left Humerus fracture open reduction and internal fixation using Bionmet ALPS plate on D34-534 Has sling to arm, follow with ortho  Dr Veverly Fells in two weeks, if remain in the hospital , may ask Dr Veverly Fells to come to see patient while in the hospital  Elevated d-dimer: likely from active cancer, CT angio chest was negative for PE, Dopplers of lower extremity negative for DVT.  Elevated alkaline phosphatase: Secondary to metastatic bone disease.  HTN: coreg, lasix  Stage 2-3 decubitus ulcer sacrum present on admission Local care.  Pressure Injury 12/25/18 Sacrum Mid Deep Tissue Injury - Purple or maroon localized area of discolored intact skin or blood-filled blister due to damage of underlying soft tissue from pressure and/or shear. evolving into stage 2 in patchy areas when asse (Active)  12/25/18 1700  Location: Sacrum  Location Orientation: Mid  Staging: Deep Tissue Injury - Purple or maroon localized area of discolored intact skin or blood-filled blister due to damage of underlying soft tissue from pressure and/or shear.  Wound Description (Comments): evolving into stage 2 in patchy areas when assessed on 8/2  Present on Admission: Yes     Nutrition  Problem: Increased nutrient needs Etiology: chronic illness, cancer and cancer related treatments   FTT: she is declined by CIR, snf placement recommended, no bed, likely will need to finish abx here, then home  DVT Prophylaxis:lovenox  Code Status: full Family Communication: patient  Disposition Plan:  No snf bed yet, not safe to go home as left arm nonweight bearing, right arm + picc line Daughter has cerebral palsy but is functional May have to finish abx treatment here (8/27) then remove picc line, then home with home health if still no snf offer She reports hopefully she can have a ramp built to help her get in and out of the house    Kelly Splinter MD  [facility] [phone] 01/20/2019, 6:23 PM      Consultants:  Oncology  Critical care  Ortho  Infectious disease  Procedures:  OPEN REDUCTION INTERNAL FIXATION (ORIF) HUMERAL FRACTURE on 8/14  Intubation/extubation  Single lumen picc line placement on 8/4  Antibiotics:  IV Ancef from 7/31---8/27   Objective: BP 136/61 (BP Location: Right Leg)   Pulse 92   Temp 98.3 F (36.8 C)   Resp 18   Ht 5\' 3"  (1.6 m)   Wt 86.2 kg   LMP 05/28/2013 (Within Months)   SpO2 93%   BMI 33.66 kg/m   Intake/Output Summary (Last 24 hours) at 01/20/2019 1823 Last data filed at 01/20/2019 1800 Gross per 24 hour  Intake 540 ml  Output -  Net 540 ml   Filed Weights   01/16/19 0651 01/17/19 0437 01/19/19 0521  Weight: 80.1 kg 83.2 kg 86.2 kg    Exam: Patient is examined daily including today on 01/20/2019, exams remain the same as of yesterday except that has changed    General:  Frail, NAD, left arm in sling, picc line in right arm  Cardiovascular: RRR  Respiratory: diminished at basis, no wheezing , no rales  Abdomen: Soft/ND/NT, positive BS  Musculoskeletal: No Edema  Neuro: alert, oriented   Data Reviewed: Basic Metabolic Panel: Recent Labs  Lab 01/15/19 0500 01/16/19 0624 01/17/19 0500 01/19/19 0431 01/20/19 0418  NA 136 139 137 139 137  K 3.2* 3.5 3.4* 3.1* 3.6  CL 100 103 101 99 101  CO2 28 29 28 30 30   GLUCOSE 116* 117* 110* 112* 117*  BUN 12 13 16 15 12   CREATININE 0.40* 0.42* 0.38* 0.40* 0.31*  CALCIUM 6.5* 7.1* 7.3* 7.5* 7.6*  MG 1.9  --  2.2  --   --    Liver Function Tests: Recent Labs  Lab 01/14/19 0905  AST 13*  ALT 6  ALKPHOS 201*  BILITOT 0.6  PROT 6.5  ALBUMIN 2.7*   No results for input(s): LIPASE, AMYLASE in the last 168 hours. No results for input(s): AMMONIA in the last 168 hours. CBC: Recent Labs  Lab 01/17/19 0500 01/19/19 0431  WBC 8.3 7.6  NEUTROABS 5.5 4.7  HGB 8.3* 8.1*  HCT 27.9* 27.0*  MCV 108.6* 108.4*  PLT 436* 432*   Cardiac Enzymes:   No results for  input(s): CKTOTAL, CKMB, CKMBINDEX, TROPONINI in the last 168 hours. BNP (last 3 results) Recent Labs    10/26/18 1921 11/14/18 1621 12/25/18 1009  BNP 48.6 37.5 89.0    ProBNP (last 3 results) No results for input(s): PROBNP in the last 8760 hours.  CBG: No results for input(s): GLUCAP in the last 168 hours.  Recent Results (from the past 240 hour(s))  Novel Coronavirus, NAA (hospital order; send-out  to ref lab)     Status: None   Collection Time: 01/14/19 12:42 PM   Specimen: Nasopharyngeal Swab; Respiratory  Result Value Ref Range Status   SARS-CoV-2, NAA NOT DETECTED NOT DETECTED Final    Comment: (NOTE) This test was developed and its performance characteristics determined by [employer]. This test has not been FDA cleared or approved. This test has been authorized by FDA under an Emergency Use Authorization (EUA). This test is only authorized for the duration of time the declaration that circumstances exist justifying the authorization of the emergency use of in vitro diagnostic tests for detection of SARS-CoV-2 virus and/or diagnosis of COVID-19 infection under section 564(b)(1) of the Act, 21 U.S.C. KA:123727), unless the authorization is terminated or revoked sooner. When diagnostic testing is negative, the possibility of a false negative result should be considered in the context of a patient's recent exposures and the presence of clinical signs and symptoms consistent with COVID-19. An individual without symptoms of COVID-19 and who is not shedding SARS-CoV-2 virus would expect to have a negative (not detected) result in this assay. Performed  At: [facility] [place], [place] [id number] Rush Farmer MD [id number]    [place]  Final    Comment: Performed at [place] [address], [place], [place] [address]  Culture, Urine     Status: Abnormal   Collection Time: 01/15/19  11:50 AM   Specimen: Urine, Random  Result Value Ref Range Status   Specimen Description   Final    URINE, RANDOM Performed at [facility] [address] [place], [place] [address]    Special Requests   Final    NONE Performed at [facility], [place] [address], [place], [place] [address]    Culture 10,000 COLONIES/mL ENTEROBACTER ASBURIAE (A)  Final   Report Status 01/17/2019 FINAL  Final   Organism ID, Bacteria ENTEROBACTER ASBURIAE (A)  Final      Susceptibility   Enterobacter asburiae - MIC*    CEFAZOLIN >=64 RESISTANT Resistant     CEFTRIAXONE >=64 RESISTANT Resistant     CIPROFLOXACIN <=0.25 SENSITIVE Sensitive     GENTAMICIN <=1 SENSITIVE Sensitive     IMIPENEM <=0.25 SENSITIVE Sensitive     NITROFURANTOIN 64 INTERMEDIATE Intermediate     TRIMETH/SULFA <=20 SENSITIVE Sensitive     PIP/TAZO >=128 RESISTANT Resistant     * 10,000 COLONIES/mL ENTEROBACTER ASBURIAE  Novel Coronavirus, NAA (hospital order; send-out to ref lab)     Status: None   Collection Time: 01/18/19  5:40 PM  Result Value Ref Range Status   SARS-CoV-2, NAA NOT DETECTED NOT DETECTED Final    Comment: (NOTE) This test was developed and its performance characteristics determined by [employer]. This test has not been FDA cleared or approved. This test has been authorized by FDA under an Emergency Use Authorization (EUA). This test is only authorized for the duration of time the declaration that circumstances exist justifying the authorization of the emergency use of in vitro diagnostic tests for detection of SARS-CoV-2 virus and/or diagnosis of COVID-19 infection under section 564(b)(1) of the Act, 21 U.S.C. KA:123727), unless the authorization is terminated or revoked sooner. When diagnostic testing is negative, the possibility of a false negative result should be considered in the context of a patient's recent exposures and the presence of clinical  signs and symptoms consistent with COVID-19. An individual without symptoms of COVID-19 and who is not shedding SARS-CoV-2  virus would expect to have a negative (not detected) result in this assay. Performed  At: [facility] [place], [place] [id number] Rush Farmer MD [id number]    [place]  Final    Comment: Performed at [place] [address] [place], [place] [address]     Studies: Dg Chest 1 View  Result Date: 01/20/2019 CLINICAL DATA:  Status post right thoracentesis. History of metastatic breast cancer EXAM: CHEST  1 VIEW COMPARISON:  01/17/2019 FINDINGS: Stable cardiomediastinal contours. Decrease right-sided pleural effusion. Lung volumes are low. No appreciable pneumothorax. A right-sided PICC line is present. Surgical fixation hardware in the proximal left humerus. Extensive osseous metastatic disease throughout the visualized thorax. IMPRESSION: Interval decrease in a right-sided pleural effusion. No pneumothorax identified. Electronically Signed   By: Davina Poke M.D.   On: 01/20/2019 15:54   US Thoracentesis Asp Pleural Space W/img Guide  Result Date: 01/20/2019 INDICATION: Patient with history of metastatic breast cancer, recurrent malignant right pleural effusion. Request to IR for diagnostic and therapeutic right sided thoracentesis today. EXAM: ULTRASOUND GUIDED RIGHT THORACENTESIS MEDICATIONS: 10 mL 1% lidocaine COMPLICATIONS: None immediate. PROCEDURE: An ultrasound guided thoracentesis was thoroughly discussed with the patient and questions answered. The benefits, risks, alternatives and complications were also discussed. The patient understands and wishes to proceed with the procedure. Written consent was obtained. Ultrasound was performed to localize and mark an adequate pocket of fluid in the right chest. The area was then prepped and draped in the normal sterile fashion. 1% Lidocaine was  used for local anesthesia. Under ultrasound guidance a 6 Fr Safe-T-Centesis catheter was introduced. No pleural fluid was able to be aspirated. Patient with significant pain in her back and left shoulder which was present prior to the procedure and was unchanged during the procedure - she is unable to lay on her left side due to recent open reduction internal fixation of the left humerus. Due to significant pain patient requested that procedure be aborted. The catheter was removed and a dressing applied. FINDINGS: No pleural fluid obtained. IMPRESSION: Unsuccessful ultrasound guided right thoracentesis yielding no of pleural fluid. Read by Candiss Norse, PA-C Electronically Signed   By: Jacqulynn Cadet M.D.   On: 01/20/2019 15:54    Scheduled Meds: . anastrozole  1 mg Oral Daily  . carvedilol  3.125 mg Oral BID WC  . chlorhexidine  15 mL Mouth Rinse BID  . Chlorhexidine Gluconate Cloth  6 each Topical Daily  . enoxaparin (LOVENOX) injection  40 mg Subcutaneous Q24H  . feeding supplement  1 Container Oral TID BM  . feeding supplement (ENSURE ENLIVE)  237 mL Oral BID BM  . furosemide  20 mg Intravenous Daily  . guaiFENesin  600 mg Oral BID  . ipratropium-albuterol  3 mL Nebulization BID  . lidocaine      . mouth rinse  15 mL Mouth Rinse q12n4p  . multivitamin with minerals  1 tablet Oral Daily  . nystatin   Topical TID  . pantoprazole (PROTONIX) IV  40 mg Intravenous Q24H  . sodium chloride flush  3 mL Intravenous Q12H    Continuous Infusions: . sodium chloride 250 mL (01/12/19 1304)  . sodium chloride Stopped (01/13/19 2144)  .  ceFAZolin (ANCEF) IV 2 g (01/20/19 1342)     Time spent: 79mins I have personally reviewed and interpreted on  01/20/2019 daily labs, imagings as discussed above under date review session and assessment and plans.  I reviewed all  nursing notes, pharmacy notes, consultant notes,  vitals, pertinent old records  I have discussed plan of care as described  above with RN , patient on 01/20/2019   Sol Blazing MD, PhD, FACP  [facility] Pager [phone]. If 7PM-7AM, please contact night-coverage at [email or web address], password [facility] 01/20/2019, 6:23 PM  LOS: 26 days

## 2019-01-20 NOTE — Progress Notes (Signed)
Ptr continues to refused BiPAP qhs.  Pt encouraged to contact RT should she change her mind.  Machine remains in room.

## 2019-01-20 NOTE — Progress Notes (Signed)
Occupational Therapy Treatment Patient Details Name: Suzanne [name] MRN: [id number] DOB: 1960/10/27 Today's Date: 01/20/2019    History of present illness Pt admitted with acute on chronic respiratory failure and hx of breast CA with mets to bone (currently with L UE in sling 2* pathologic fx) and lungs, chronic diastolic heart failure, anemia and pleural effusion. Pt now s/p LUE ORIF 8/14 and off vent 8/16   OT comments  Pt progressing towards OT goals; presents seated in recliner agreeable to therapy session. Pt performing stand pivot transfers this session with minA using quad cane; completing toileting ADL with overall modA. Pt engaged in additional A/AAROM to LUE (elbow, wrist, hand) within pt tolerance, readjusted sling to optimize LUE positioning and for comfort. Will continue per POC.    Follow Up Recommendations  SNF    Equipment Recommendations  None recommended by OT          Precautions / Restrictions Precautions Precautions: Fall;Shoulder Type of Shoulder Precautions: Elbow, wrist, hand ROM ok; Pendulums and scap retraction ok; No AROM/PROM to shoulder Shoulder Interventions: Shoulder sling/immobilizer Required Braces or Orthoses: Sling Restrictions Weight Bearing Restrictions: Yes LUE Weight Bearing: Non weight bearing       Mobility Bed Mobility               General bed mobility comments: pt OOB in recliner upon arrival, remaining up in recliner end of sesion  Transfers Overall transfer level: Needs assistance Equipment used: Quad cane Transfers: Sit to/from [employer] to Stand: Min assist Stand pivot transfers: Min assist       General transfer comment: MIN A to power up; transfer recliner<>BSC with steadying assist    Balance Overall balance assessment: Needs assistance Sitting-balance support: No upper extremity supported;Feet supported Sitting balance-Leahy Scale: Good     Standing balance support: Single extremity  supported Standing balance-Leahy Scale: Poor                             ADL either performed or assessed with clinical judgement   ADL Overall ADL's : Needs assistance/impaired                 Upper Body Dressing : Maximal assistance;Sitting;Adhering to UE precautions Upper Body Dressing Details (indicate cue type and reason): readjusted sling     Toilet Transfer: Minimal assistance;Stand-pivot;BSC Toilet Transfer Details (indicate cue type and reason): using quad cane Toileting- Clothing Manipulation and Hygiene: Moderate assistance;Sit to/from stand Toileting - Clothing Manipulation Details (indicate cue type and reason): requires assist for peri-care after BM, completed in standing     Functional mobility during ADLs: Minimal assistance;Cane(quad cane, stand pivot transfer)       Vision       Perception     Praxis      Cognition Arousal/Alertness: Awake/alert Behavior During Therapy: WFL for tasks assessed/performed Overall Cognitive Status: Within Functional Limits for tasks assessed                                 General Comments: pt tearful start of session        Exercises Exercises: Hand exercises;General Upper Extremity;Shoulder General Exercises - Upper Extremity Elbow Flexion: AAROM;Left;10 reps;Seated Elbow Extension: AAROM;Left;10 reps;Seated Wrist Flexion: AROM;Left;10 reps;Seated Wrist Extension: AROM;Left;10 reps;Seated Digit Composite Flexion: AROM;Left;10 reps;Seated Composite Extension: AROM;Left;10 reps;Seated Hand Exercises Forearm Supination: AROM;Left;10 reps;Seated Forearm Pronation: AROM;Left;10 reps;Seated  Shoulder Instructions Shoulder Instructions Donning/doffing sling/immobilizer: Maximal assistance Correct positioning of sling/immobilizer: Moderate assistance ROM for elbow, wrist and digits of operated UE: Minimal assistance Sling wearing schedule (on at all times/off for ADL's): Modified  independent     General Comments      Pertinent Vitals/ Pain       Pain Assessment: Faces Faces Pain Scale: Hurts a little bit Pain Location: LUE Pain Descriptors / Indicators: Sore;Guarding Pain Intervention(s): Monitored during session;Repositioned;Limited activity within patient's tolerance  Home Living                                          Prior Functioning/Environment              Frequency  Min 2X/week        Progress Toward Goals  OT Goals(current goals can now be found in the care plan section)  Progress towards OT goals: Progressing toward goals  Acute Rehab OT Goals Patient Stated Goal: Short term rehab OT Goal Formulation: With patient Time For Goal Achievement: 01/25/19 Potential to Achieve Goals: Good ADL Goals Pt Will Perform Upper Body Bathing: with min assist;standing;with adaptive equipment Pt Will Perform Lower Body Bathing: with min assist;sit to/from stand;sitting/lateral leans;with adaptive equipment Pt Will Perform Upper Body Dressing: with min assist;sitting;with adaptive equipment Pt Will Perform Lower Body Dressing: with min assist;sitting/lateral leans;sit to/from stand;with adaptive equipment Pt Will Transfer to Toilet: with min guard assist;bedside commode;ambulating Pt Will Perform Toileting - Clothing Manipulation and hygiene: with modified independence;sitting/lateral leans;sit to/from stand Pt Will Perform Tub/Shower Transfer: with min assist;shower seat;3 in 1;ambulating Pt/caregiver will Perform Home Exercise Program: Left upper extremity;Increased ROM;With written HEP provided(s/p L humerus ORIF)  Plan Discharge plan remains appropriate    Co-evaluation                 AM-PAC OT "6 Clicks" Daily Activity     Outcome Measure   Help from another person eating meals?: A Little Help from another person taking care of personal grooming?: A Little Help from another person toileting, which includes using  toliet, bedpan, or urinal?: A Lot Help from another person bathing (including washing, rinsing, drying)?: A Lot Help from another person to put on and taking off regular upper body clothing?: A Lot Help from another person to put on and taking off regular lower body clothing?: A Lot 6 Click Score: 14    End of Session Equipment Utilized During Treatment: Other (comment)(sling, quad cane)  OT Visit Diagnosis: Other abnormalities of gait and mobility (R26.89);Muscle weakness (generalized) (M62.81);Unsteadiness on feet (R26.81);Pain Pain - Right/Left: Left Pain - part of body: Shoulder   Activity Tolerance Patient tolerated treatment well   Patient Left in chair;with call bell/phone within reach   Nurse Communication Mobility status        Time: [id number] OT Time Calculation (min): 24 min  Charges: OT General Charges $OT Visit: 1 Visit OT Treatments $Self Care/Home Management : 8-22 mins $Therapeutic Activity: 8-22 mins  Lou Cal, OT Supplemental Rehabilitation Services Pager [phone] Office [phone]    Suzanne [name] 01/20/2019, 12:41 PM

## 2019-01-21 DIAGNOSIS — J029 Acute pharyngitis, unspecified: Secondary | ICD-10-CM

## 2019-01-21 LAB — BASIC METABOLIC PANEL
Anion gap: 6 (ref 5–15)
BUN: 14 mg/dL (ref 6–20)
CO2: 29 mmol/L (ref 22–32)
Calcium: 7.9 mg/dL — ABNORMAL LOW (ref 8.9–10.3)
Chloride: 104 mmol/L (ref 98–111)
Creatinine, Ser: 0.42 mg/dL — ABNORMAL LOW (ref 0.44–1.00)
GFR calc Af Amer: >60 mL/min (ref >60)
GFR calc non Af Amer: >60 mL/min (ref >60)
Glucose, Bld: 125 mg/dL — ABNORMAL HIGH (ref 70–99)
Potassium: 3.8 mmol/L (ref 3.5–5.1)
Sodium: 139 mmol/L (ref 135–145)

## 2019-01-21 NOTE — Progress Notes (Signed)
Physical Therapy Treatment Patient Details Name: Suzanne [name] MRN: [id number] DOB: Nov 17, 1960 Today's Date: 01/21/2019    History of Present Illness Pt admitted with acute on chronic respiratory failure and hx of breast CA with mets to bone (currently with L UE in sling 2* pathologic fx) and lungs, chronic diastolic heart failure, anemia and pleural effusion. Pt now s/p LUE ORIF 8/14 and off vent 8/16    PT Comments    Pt just assisted back to bed by nursing when I entered the room. Pt reports she has been getting into the recliner QD. Pt reports fatigue, but agreed to bed exercises. Pt completed LE exercises with min assist. Encouraged pt to complete the HEP daily to facilitate increased strength and activity tolerance. Pt will continue to benefit from acute skilled PT to maximize mobility and Independence.  Follow Up Recommendations  SNF     Equipment Recommendations  None recommended by PT    Recommendations for Other Services       Precautions / Restrictions Precautions Precautions: Shoulder;Fall Shoulder Interventions: Shoulder sling/immobilizer Required Braces or Orthoses: Sling Restrictions Weight Bearing Restrictions: Yes LUE Weight Bearing: Non weight bearing Other Position/Activity Restrictions: L UE    Mobility  Bed Mobility               General bed mobility comments: Pt just returned to bed with nursing  Transfers                 General transfer comment: Pt declined OOB due to just got back in bed and fatigued  Ambulation/Gait                 Stairs             Wheelchair Mobility    Modified Rankin (Stroke Patients Only)       Balance                                            Cognition   Behavior During Therapy: WFL for tasks assessed/performed Overall Cognitive Status: Within Functional Limits for tasks assessed                                        Exercises General  Exercises - Lower Extremity Ankle Circles/Pumps: AROM;Strengthening;Both;10 reps;Supine Quad Sets: AROM;Strengthening;Both;10 reps;Supine Short Arc Quad: AROM;Strengthening;Both;10 reps;Supine Heel Slides: AROM;Strengthening;Both;10 reps;Supine Hip ABduction/ADduction: AAROM;Strengthening;Both;10 reps;Supine Straight Leg Raises: AAROM;Strengthening;Both;10 reps;Supine    General Comments General comments (skin integrity, edema, etc.): Pt reports she has been getting into the recliner QD. Pt states she is able to stand with someone nearby. Pt reports improvement, but continues to have LE weakness and easily fatigues. Pt agreeable to bed exercises.      Pertinent Vitals/Pain Pain Assessment: No/denies pain    Home Living                      Prior Function            PT Goals (current goals can now be found in the care plan section) Progress towards PT goals: Progressing toward goals    Frequency    Min 3X/week      PT Plan Current plan remains appropriate    Co-evaluation  AM-PAC PT "6 Clicks" Mobility   Outcome Measure  Help needed turning from your back to your side while in a flat bed without using bedrails?: A Lot Help needed moving from lying on your back to sitting on the side of a flat bed without using bedrails?: A Lot Help needed moving to and from a bed to a chair (including a wheelchair)?: A Little Help needed standing up from a chair using your arms (e.g., wheelchair or bedside chair)?: A Little Help needed to walk in hospital room?: A Little Help needed climbing 3-5 steps with a railing? : A Lot 6 Click Score: 15    End of Session   Activity Tolerance: Patient limited by fatigue Patient left: in bed;with call bell/phone within reach Nurse Communication: Mobility status PT Visit Diagnosis: Difficulty in walking, not elsewhere classified (R26.2);Unsteadiness on feet (R26.81)     Time: 1032-1050 PT Time Calculation (min)  (ACUTE ONLY): 18 min  Charges:  $Therapeutic Exercise: 8-22 mins                     Theodoro Grist, PT   Lelon Mast 01/21/2019, 10:53 AM

## 2019-01-21 NOTE — TOC Progression Note (Signed)
Transition of Care [facility]) - Progression Note    Patient Details  Name: Suzanne [name] MRN: [id number] Date of Birth: [birth year]  [facility]) CM/SW [place], LCSW Phone Number: 01/21/2019, 9:12 AM  Clinical Narrative:    Patient daughter provided social security administration Rep. Ebony Hail with CSW contact inforamtion. CSW received a call from Rep. Ebony Hail with social security office, office is processing the patient social security claim for approval. Rep. requested to talk with patient. Patient agreeable to complete the phone interview.       Expected Discharge Plan and Services  Home         Expected Discharge Date: (unknown)                                   Social Determinants of Health (SDOH) Interventions    Readmission Risk Interventions Readmission Risk Prevention Plan 01/16/2019 12/01/2018 11/17/2018  Transportation Screening Complete Complete Complete  PCP or Specialist Appt within 3-5 Days - - Not Complete  Not Complete comments - - will need pcp set up  [place] or [place] - - Complete  Social Work Consult for [place] Planning/Counseling - - Complete  Palliative Care Screening - - Not Complete  Palliative Care Screening Not Complete Comments - - pending- bedside RN states plan to consult palliative this admission  Medication Review ([place]) Complete Complete Complete  PCP or Specialist appointment within 3-5 days of discharge Complete Complete -  [place] or Home Care Consult Complete Complete -  SW Recovery Care/Counseling Consult Complete Complete -  Palliative Care Screening Not Applicable Not Applicable -  Skilled Nursing Facility Complete Complete -  Some recent data might be hidden

## 2019-01-21 NOTE — Progress Notes (Signed)
Patient continues to decline nocturnal PAP. Order changed to prn and equipment removed from bedside. RT will continue to follow.

## 2019-01-21 NOTE — Progress Notes (Signed)
PROGRESS NOTE    Suzanne [name]  [id number] DOB: January 29, 1961 DOA: 12/25/2018 PCP: Patient, No Pcp Per    Brief Narrative:  58 year old with past medical history significant for stage IV breast cancer with known metastasis to bone, lung and lymph nodes and malignant pleural effusion admitted on 12/25/2018 with worsening shortness of breath and altered mental status. Of note patient was diagnosed with metastatic breast cancer in early June 2020. At the time of diagnosis she was found to have widespread metastatic disease and pathologic fracture in her right right clavicle. She subsequently underwent neoadjuvant chemotherapy but has also had her treatment complicated by recurrent malignant right-sided pleural effusion, neutropenia and thrombocytopenia.    In the emergency room patient was noted to be septic, likely secondary to sacral decubital ulcer, blood cultures on admission grew MSSA. ID was consulted and recommendation was for 4 weeks of Ancef, which finish on 8/27. Patient was also found to have a pathologic left humeral fracture. Examination by orthopedic surgery recommended surgery this admission.   Patient underwentleft Humerus fracture open reduction and internal fixation using Bionmet ALPS plate on8/14.Post surgery patient develop acute respiratory distress and she was reintubated. Patient was subsequently extubated on 8/15, she was placed on BiPAP. Her respiratory status has been stable, on BiPAP at bedtime. Respiratory failure was thought to be related to pulmonary edema and pleural effusion and chronic elevation of the right hemi-diaphragma.she was a started on IV Lasix.   Assessment & Plan:   Principal Problem:   Malignant pleural effusion Active Problems:   Bone metastasis (HCC)   Pleural effusion, bilateral   Carcinoma of breast, estrogen receptor positive, stage 4, right (HCC)   Acute on chronic respiratory failure with hypoxia (HCC)   Pressure injury of  skin   Pneumonia   Respiratory failure, acute (HCC)   Fracture closed, humerus, shaft   Acute on chronic diastolic CHF (congestive heart failure) (HCC)   Bacteremia   FTT (failure to thrive) in adult  Acute on chronic hypoxic respiratory failure, likely  Multifactorial including malignant pleural effusionand a/c diast CHF -Patient presented with worsening hypoxia and admitted on 12/25/18. cxr with known bilateral pleural effusions on presentation. cta on presentation was negative for PE -She has a known right malignant pleural effusion. Last thoracentesis was 6/20. Per chart review, there was some concern of possible lymphangitic spread of tumor in the lungs. -She was initially on IV Solu-Medrol which has been discontinued. -Patient had to be re-intubated post shoulder surgery 8/14, extubated on 8/15 -  repeat cxr on 8/22showed recurrent right pleural effusion,  IR consulted for right sided thoracentesis > pt went to IR8/25 where [place] only showed minimal fluid and they tried to aspirate some but the procedure was difficult because of her L arm surg she couldn't lie on the L side , so they had to abort. Not sure yet if any fluid was sent off >  From this we must assume that the RLL xray changes are related to [facility] +/- atelectasis. No septations were seen by [place], reviewed -Issues with disposition noted. Plan for completion of abx tomorrow, then d/c home with home health   Stage IV metastatic breast cancer to bone and lung: -Oncology Dr Jana Hakim follow up appreciated.. - anastrozole, palbociclib and denosumab/xgeva per oncology - Stable at present   Acute on chronic diastolic chf:- -currently on BIPAP at HS and PRN during day, nasal cannula day time -Continue with incentive spirometry, out of bed.  - she has had many  days of IV lasix 20 bid, decreased yest to 20 mg IV qd and will dc today - no sig vol overload on exam at this point  MSSA bacteremia/sepsis; -Sepsis present on  admission. Likely source a stage II/III sacral decubitus ulcer. -ID consultation was obtained. -Sacral wound did not require any surgical intervention.. -TTE did not show any signs of endocarditis.. -Repeated blood cultures on 7/30 first did not show any growth -Per ID recommendation, patient is currently on 4 weeks course of Ancefuntil 8/27 - single lumen PICC line to right arm placed on 8/4. 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 -Seen by radiation oncology.  -Seen by Dr. Veverly Fells with orthopedic who recommended surgery this admission. -Dr. [place] Pink discussed case with Dr. Jana Hakim and orthopedic doctor and decision was made to proceed with surgery this admission >> so underwent left Humerus fracture open reduction and internal fixation using Bionmet ALPS plate on [id number] Has sling to arm, follow with ortho  Dr Veverly Fells in two weeks,  Elevated d-dimer: -likely from active cancer, CT angio chest was negative for PE, Dopplers of lower extremity negative for DVT. -Stable at this time  Elevated alkaline phosphatase: -Secondary to metastatic bone disease. -Presently stable  HTN:  -BP stable -Cont currently coreg, lasix  Stage 2-3 decubitus ulcer sacrum present on admission Local care.  Pressure Injury 12/25/18 Sacrum Mid Deep Tissue Injury - Purple or  maroon localized area of discolored intact skin or blood-filled blister due to damage of underlying soft tissue from pressure and/or shear. evolving into stage 2 in patchy areas when asse (Active)  12/25/18 1700  Location: Sacrum  Location Orientation: Mid  Staging: Deep Tissue Injury - Purple or maroon localized area of discolored intact skin or blood-filled blister due to damage of underlying soft tissue from pressure and/or shear.  Wound Description (Comments): evolving into stage 2 in patchy areas when assessed on 8/2  Present on Admission: Yes    Nutrition Problem: Increased nutrient needs Etiology: chronic illness, cancer and cancer related treatments   FTT:  -she is declined by CIR, snf placement recommended, no bed,  -Plan to finish abx here, then home  DVT prophylaxis: Lovenox subQ Code Status: Full Family Communication: Pt in room, family not at bedside Disposition Plan: Possible d/c home in 24hrs  Consultants:   Oncology  Critical care  Ortho  Infectious disease  Procedures:   OPEN REDUCTION INTERNAL FIXATION (ORIF) HUMERAL FRACTURE on 8/14  Intubation/extubation  Single lumen picc line placement on 8/4  Antimicrobials: Anti-infectives (From admission, onward)   Start     Dose/Rate Route Frequency Ordered Stop   01/10/19 1200  ceFAZolin (ANCEF) IVPB 2g/100 mL premix     2 g 200 mL/hr over 30 Minutes Intravenous Every 8 hours 01/10/19 1048 01/22/19 2359   01/09/19 2315  ceFAZolin (ANCEF) IVPB 2g/100 mL premix  Status:  Discontinued  2 g 200 mL/hr over 30 Minutes Intravenous Every 6 hours 01/09/19 2303 01/09/19 2316   12/31/18 0000  ceFAZolin (ANCEF) IVPB     2 g Intravenous Every 8 hours 12/31/18 1554 01/23/19 2359   12/26/18 0600  ceFAZolin (ANCEF) IVPB 2g/100 mL premix     2 g 200 mL/hr over 30 Minutes Intravenous Every 8 hours 12/26/18 0445 01/09/19 2359   12/25/18 2300  vancomycin (VANCOCIN) IVPB 1000 mg/200 mL premix  Status:  Discontinued      1,000 mg 200 mL/hr over 60 Minutes Intravenous Every 12 hours 12/25/18 1101 12/26/18 0444   12/25/18 2200  ceFEPIme (MAXIPIME) 2 g in sodium chloride 0.9 % 100 mL IVPB  Status:  Discontinued     2 g 200 mL/hr over 30 Minutes Intravenous Every 8 hours 12/25/18 1447 12/26/18 0444   12/25/18 1515  ceFEPIme (MAXIPIME) 1 g in sodium chloride 0.9 % 100 mL IVPB     1 g 200 mL/hr over 30 Minutes Intravenous  Once 12/25/18 1446 12/25/18 1728   12/25/18 1115  vancomycin (VANCOCIN) 2,000 mg in sodium chloride 0.9 % 500 mL IVPB     2,000 mg 250 mL/hr over 120 Minutes Intravenous  Once 12/25/18 1101 12/25/18 1405   12/25/18 1045  ceFEPIme (MAXIPIME) 1 g in sodium chloride 0.9 % 100 mL IVPB     1 g 200 mL/hr over 30 Minutes Intravenous  Once 12/25/18 1022 12/25/18 1207       Subjective: Eager to go home  Objective: Vitals:   01/20/19 2051 01/21/19 0635 01/21/19 0816 01/21/19 1515  BP:  119/66  (!) 137/50  Pulse:  92  (!) 106  Resp:  17  18  Temp:  98.6 F (37 C)  98.4 F (36.9 C)  TempSrc:  Oral  Oral  SpO2: 99% 100% 99% 99%  Weight:      Height:        Intake/Output Summary (Last 24 hours) at 01/21/2019 1603 Last data filed at 01/21/2019 1523 Gross per 24 hour  Intake 760 ml  Output 300 ml  Net 460 ml   Filed Weights   01/16/19 0651 01/17/19 0437 01/19/19 0521  Weight: 80.1 kg 83.2 kg 86.2 kg    Examination:  General exam: Appears calm and comfortable  Respiratory system: Clear to auscultation. Respiratory effort normal. Cardiovascular system: S1 & S2 heard, RRR Gastrointestinal system: Abdomen is nondistended, soft and nontender. No organomegaly or masses felt. Normal bowel sounds heard. Central nervous system: Alert and oriented. No focal neurological deficits. Extremities: Symmetric 5 x 5 power. Skin: No rashes, lesions Psychiatry: Judgement and insight appear normal. Mood & affect appropriate.   Data Reviewed: I have personally reviewed following labs and imaging  studies  CBC: Recent Labs  Lab 01/17/19 0500 01/19/19 0431  WBC 8.3 7.6  NEUTROABS 5.5 4.7  HGB 8.3* 8.1*  HCT 27.9* 27.0*  MCV 108.6* 108.4*  PLT 436* 123456*   Basic Metabolic Panel: Recent Labs  Lab 01/15/19 0500 01/16/19 0624 01/17/19 0500 01/19/19 0431 01/20/19 0418 01/21/19 0356  NA 136 139 137 139 137 139  K 3.2* 3.5 3.4* 3.1* 3.6 3.8  CL 100 103 101 99 101 104  CO2 28 29 28 30 30 29   GLUCOSE 116* 117* 110* 112* 117* 125*  BUN 12 13 16 15 12 14   CREATININE 0.40* 0.42* 0.38* 0.40* 0.31* 0.42*  CALCIUM 6.5* 7.1* 7.3* 7.5* 7.6* 7.9*  MG 1.9  --  2.2  --   --   --  GFR: Estimated Creatinine Clearance: 77.8 mL/min (A) (by C-G formula based on SCr of 0.42 mg/dL (L)). Liver Function Tests: No results for input(s): AST, ALT, ALKPHOS, BILITOT, PROT, ALBUMIN in the last 168 hours. No results for input(s): LIPASE, AMYLASE in the last 168 hours. No results for input(s): AMMONIA in the last 168 hours. Coagulation Profile: No results for input(s): INR, PROTIME in the last 168 hours. Cardiac Enzymes: No results for input(s): CKTOTAL, CKMB, CKMBINDEX, TROPONINI in the last 168 hours. BNP (last 3 results) No results for input(s): PROBNP in the last 8760 hours. HbA1C: No results for input(s): HGBA1C in the last 72 hours. CBG: No results for input(s): GLUCAP in the last 168 hours. Lipid Profile: No results for input(s): CHOL, HDL, LDLCALC, TRIG, CHOLHDL, LDLDIRECT in the last 72 hours. Thyroid Function Tests: No results for input(s): TSH, T4TOTAL, FREET4, T3FREE, THYROIDAB in the last 72 hours. Anemia Panel: No results for input(s): VITAMINB12, FOLATE, FERRITIN, TIBC, IRON, RETICCTPCT in the last 72 hours. Sepsis Labs: No results for input(s): PROCALCITON, LATICACIDVEN in the last 168 hours.  Recent Results (from the past 240 hour(s))  Novel Coronavirus, NAA (hospital order; send-out to ref lab)     Status: None   Collection Time: 01/14/19 12:42 PM   Specimen:  Nasopharyngeal Swab; Respiratory  Result Value Ref Range Status   SARS-CoV-2, NAA NOT DETECTED NOT DETECTED Final    Comment: (NOTE) This test was developed and its performance characteristics determined by [employer]. This test has not been FDA cleared or approved. This test has been authorized by FDA under an Emergency Use Authorization (EUA). This test is only authorized for the duration of time the declaration that circumstances exist justifying the authorization of the emergency use of in vitro diagnostic tests for detection of SARS-CoV-2 virus and/or diagnosis of COVID-19 infection under section 564(b)(1) of the Act, 21 U.S.C. [id number]), unless the authorization is terminated or revoked sooner. When diagnostic testing is negative, the possibility of a false negative result should be considered in the context of a patient's recent exposures and the presence of clinical signs and symptoms consistent with COVID-19. An individual without symptoms of COVID-19 and who is not shedding SARS-CoV-2 virus would expect to have a negative (not detected) result in this assay. Performed  At: [facility] [place], [place] [id number] Rush Farmer MD [id number]    [place]  Final    Comment: Performed at [facility] [address], [place], [place] [address]  Culture, Urine     Status: Abnormal   Collection Time: 01/15/19 11:50 AM   Specimen: Urine, Random  Result Value Ref Range Status   Specimen Description   Final    URINE, RANDOM Performed at [facility] [address], [place], [place] [address]    Special Requests   Final    NONE Performed at [facility], [place] [address], [place], [place] [address]    Culture 10,000 COLONIES/mL ENTEROBACTER ASBURIAE (A)  Final   Report Status 01/17/2019 FINAL  Final   Organism ID, Bacteria ENTEROBACTER ASBURIAE (A)  Final        Susceptibility   Enterobacter asburiae - MIC*    CEFAZOLIN >=64 RESISTANT Resistant     CEFTRIAXONE >=64 RESISTANT Resistant     CIPROFLOXACIN <=0.25 SENSITIVE Sensitive     GENTAMICIN <=1 SENSITIVE Sensitive     IMIPENEM <=0.25 SENSITIVE Sensitive     NITROFURANTOIN 64 INTERMEDIATE Intermediate  TRIMETH/SULFA <=20 SENSITIVE Sensitive     PIP/TAZO >=128 RESISTANT Resistant     * 10,000 COLONIES/mL ENTEROBACTER ASBURIAE  Novel Coronavirus, NAA (hospital order; send-out to ref lab)     Status: None   Collection Time: 01/18/19  5:40 PM  Result Value Ref Range Status   SARS-CoV-2, NAA NOT DETECTED NOT DETECTED Final    Comment: (NOTE) This test was developed and its performance characteristics determined by [employer]. This test has not been FDA cleared or approved. This test has been authorized by FDA under an Emergency Use Authorization (EUA). This test is only authorized for the duration of time the declaration that circumstances exist justifying the authorization of the emergency use of in vitro diagnostic tests for detection of SARS-CoV-2 virus and/or diagnosis of COVID-19 infection under section 564(b)(1) of the Act, 21 U.S.C. [id number]), unless the authorization is terminated or revoked sooner. When diagnostic testing is negative, the possibility of a false negative result should be considered in the context of a patient's recent exposures and the presence of clinical signs and symptoms consistent with COVID-19. An individual without symptoms of COVID-19 and who is not shedding SARS-CoV-2 virus would expect to have a negative (not detected) result in this assay. Performed  At: [facility] [place], [place] [id number] Rush Farmer MD [id number]    [place]  Final    Comment: Performed at [facility] [address], [place], [place] [address]     Radiology Studies: Dg Chest 1  View  Result Date: 01/20/2019 CLINICAL DATA:  Status post right thoracentesis. History of metastatic breast cancer EXAM: CHEST  1 VIEW COMPARISON:  01/17/2019 FINDINGS: Stable cardiomediastinal contours. Decrease right-sided pleural effusion. Lung volumes are low. No appreciable pneumothorax. A right-sided PICC line is present. Surgical fixation hardware in the proximal left humerus. Extensive osseous metastatic disease throughout the visualized thorax. IMPRESSION: Interval decrease in a right-sided pleural effusion. No pneumothorax identified. Electronically Signed   By: Davina Poke M.D.   On: 01/20/2019 15:54   US Thoracentesis Asp Pleural Space W/img Guide  Result Date: 01/20/2019 INDICATION: Patient with history of metastatic breast cancer, recurrent malignant right pleural effusion. Request to IR for diagnostic and therapeutic right sided thoracentesis today. EXAM: ULTRASOUND GUIDED RIGHT THORACENTESIS MEDICATIONS: 10 mL 1% lidocaine COMPLICATIONS: None immediate. PROCEDURE: An ultrasound guided thoracentesis was thoroughly discussed with the patient and questions answered. The benefits, risks, alternatives and complications were also discussed. The patient understands and wishes to proceed with the procedure. Written consent was obtained. Ultrasound was performed to localize and mark an adequate pocket of fluid in the right chest. The area was then prepped and draped in the normal sterile fashion. 1% Lidocaine was used for local anesthesia. Under ultrasound guidance a 6 Fr Safe-T-Centesis catheter was introduced. No pleural fluid was able to be aspirated. Patient with significant pain in her back and left shoulder which was present prior to the procedure and was unchanged during the procedure - she is unable to lay on her left side due to recent open reduction internal fixation of the left humerus. Due to significant pain patient requested that procedure be aborted. The catheter was removed and a  dressing applied. FINDINGS: No pleural fluid obtained. IMPRESSION: Unsuccessful ultrasound guided right thoracentesis yielding no of pleural fluid. Read by Candiss Norse, PA-C Electronically Signed   By: Jacqulynn Cadet M.D.   On: 01/20/2019 15:54    Scheduled Meds:  anastrozole  1 mg Oral Daily  carvedilol  3.125 mg Oral BID WC   chlorhexidine  15 mL Mouth Rinse BID   Chlorhexidine Gluconate Cloth  6 each Topical Daily   enoxaparin (LOVENOX) injection  40 mg Subcutaneous Q24H   feeding supplement  1 Container Oral TID BM   feeding supplement (ENSURE ENLIVE)  237 mL Oral BID BM   guaiFENesin  600 mg Oral BID   ipratropium-albuterol  3 mL Nebulization BID   mouth rinse  15 mL Mouth Rinse q12n4p   multivitamin with minerals  1 tablet Oral Daily   nystatin   Topical TID   pantoprazole (PROTONIX) IV  40 mg Intravenous Q24H   Continuous Infusions:   ceFAZolin (ANCEF) IV Stopped (01/21/19 1407)     LOS: 27 days   Marylu Lund, MD [facility] Pager On Amion  If 7PM-7AM, please contact night-coverage 01/21/2019, 4:03 PM

## 2019-01-22 DIAGNOSIS — J9811 Atelectasis: Secondary | ICD-10-CM

## 2019-01-22 LAB — CBC
HCT: 28 % — ABNORMAL LOW (ref 36.0–46.0)
Hemoglobin: 8.1 g/dL — ABNORMAL LOW (ref 12.0–15.0)
MCH: 31.8 pg (ref 26.0–34.0)
MCHC: 28.9 g/dL — ABNORMAL LOW (ref 30.0–36.0)
MCV: 109.8 fL — ABNORMAL HIGH (ref 80.0–100.0)
Platelets: 467 10*3/uL — ABNORMAL HIGH (ref 150–400)
RBC: 2.55 MIL/uL — ABNORMAL LOW (ref 3.87–5.11)
RDW: 21.2 % — ABNORMAL HIGH (ref 11.5–15.5)
WBC: 7.8 10*3/uL (ref 4.0–10.5)
nRBC: 0.5 % — ABNORMAL HIGH (ref 0.0–0.2)

## 2019-01-22 LAB — BASIC METABOLIC PANEL
Anion gap: 9 (ref 5–15)
BUN: 14 mg/dL (ref 6–20)
CO2: 28 mmol/L (ref 22–32)
Calcium: 8.1 mg/dL — ABNORMAL LOW (ref 8.9–10.3)
Chloride: 104 mmol/L (ref 98–111)
Creatinine, Ser: 0.41 mg/dL — ABNORMAL LOW (ref 0.44–1.00)
GFR calc Af Amer: >60 mL/min (ref >60)
GFR calc non Af Amer: >60 mL/min (ref >60)
Glucose, Bld: 112 mg/dL — ABNORMAL HIGH (ref 70–99)
Potassium: 3.8 mmol/L (ref 3.5–5.1)
Sodium: 141 mmol/L (ref 135–145)

## 2019-01-22 NOTE — Progress Notes (Signed)
PROGRESS NOTE    Suzanne [name]  [id number] DOB: [birth year] DOA: 12/25/2018 PCP: Patient, No Pcp Per    Brief Narrative:  58 year old with past medical history significant for stage IV breast cancer with known metastasis to bone, lung and lymph nodes and malignant pleural effusion admitted on 12/25/2018 with worsening shortness of breath and altered mental status. Of note patient was diagnosed with metastatic breast cancer in early June 2020. At the time of diagnosis she was found to have widespread metastatic disease and pathologic fracture in her right right clavicle. She subsequently underwent neoadjuvant chemotherapy but has also had her treatment complicated by recurrent malignant right-sided pleural effusion, neutropenia and thrombocytopenia.    In the emergency room patient was noted to be septic, likely secondary to sacral decubital ulcer, blood cultures on admission grew MSSA. ID was consulted and recommendation was for 4 weeks of Ancef, which finish on 8/27. Patient was also found to have a pathologic left humeral fracture. Examination by orthopedic surgery recommended surgery this admission.   Patient underwentleft Humerus fracture open reduction and internal fixation using Bionmet ALPS plate on8/14.Post surgery patient develop acute respiratory distress and she was reintubated. Patient was subsequently extubated on 8/15, she was placed on BiPAP. Her respiratory status has been stable, on BiPAP at bedtime. Respiratory failure was thought to be related to pulmonary edema and pleural effusion and chronic elevation of the right hemi-diaphragma.she was a started on IV Lasix.   Assessment & Plan:   Principal Problem:   Malignant pleural effusion Active Problems:   Bone metastasis (HCC)   Pleural effusion, bilateral   Carcinoma of breast, estrogen receptor positive, stage 4, right (HCC)   Acute on chronic respiratory failure with hypoxia (HCC)   Pressure injury of  skin   Pneumonia   Respiratory failure, acute (HCC)   Fracture closed, humerus, shaft   Acute on chronic diastolic CHF (congestive heart failure) (HCC)   Bacteremia   FTT (failure to thrive) in adult  Acute on chronic hypoxic respiratory failure, likely  Multifactorial including malignant pleural effusionand a/c diast CHF -Patient presented with worsening hypoxia and admitted on 12/25/18. cxr with known bilateral pleural effusions on presentation. cta on presentation was negative for PE -She has a known right malignant pleural effusion. Last thoracentesis was 6/20. Per chart review, there was some concern of possible lymphangitic spread of tumor in the lungs. -She was initially on IV Solu-Medrol which has been discontinued. -Patient had to be re-intubated post shoulder surgery 8/14, extubated on 8/15 -  repeat cxr on 8/22showed recurrent right pleural effusion,  IR consulted for right sided thoracentesis > pt went to IR8/25 where [place] only showed minimal fluid and they tried to aspirate some but the procedure was difficult because of her L arm surg she couldn't lie on the L side , so they had to abort. Not sure yet if any fluid was sent off >  From this we must assume that the RLL xray changes are related to [facility] +/- atelectasis. No septations were seen by [place], reviewed -Issues with disposition noted. Pt to complete course of abx today. Appreciate SW input. Disposition remains in progress   Stage IV metastatic breast cancer to bone and lung: -Oncology Dr Jana Hakim follow up appreciated.. - anastrozole, palbociclib and denosumab/xgeva per oncology - Stable at present   Acute on chronic diastolic chf:- -currently on BIPAP at HS and PRN during day, nasal cannula day time -Continue with incentive spirometry, out of bed.  - she has  had many days of IV lasix 20 bid, decreased yest to 20 mg IV qd and will dc today - no sig vol overload on exam at this point  MSSA bacteremia/sepsis;  -Sepsis present on admission. Likely source a stage II/III sacral decubitus ulcer. -ID consultation was obtained. -Sacral wound did not require any surgical intervention.. -TTE did not show any signs of endocarditis.. -Repeated blood cultures on 7/30 first did not show any growth -Per ID recommendation, patient is currently on 4 weeks course of Ancefuntil 8/27 - single lumen PICC line to right arm placed on 8/4. 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 -Seen by radiation oncology.  -Seen by Dr. Veverly Fells with orthopedic who recommended surgery this admission. -Dr. [place] Pink discussed case with Dr. Jana Hakim and orthopedic doctor and decision was made to proceed with surgery this admission >> so underwent left Humerus fracture open reduction and internal fixation using Bionmet ALPS plate on [id number] Has sling to arm, follow with ortho  Dr Veverly Fells in two weeks,  Elevated d-dimer: -likely from active cancer, CT angio chest was negative for PE, Dopplers of lower extremity negative for DVT. -Stable at this time  Elevated alkaline phosphatase: -Secondary to metastatic bone disease. -Presently stable  HTN:  -BP stable -Cont currently coreg, lasix  Stage 2-3 decubitus ulcer sacrum present on admission Local care.  Pressure Injury 12/25/18 Sacrum Mid Deep Tissue  Injury - Purple or maroon localized area of discolored intact skin or blood-filled blister due to damage of underlying soft tissue from pressure and/or shear. evolving into stage 2 in patchy areas when asse (Active)  12/25/18 1700  Location: Sacrum  Location Orientation: Mid  Staging: Deep Tissue Injury - Purple or maroon localized area of discolored intact skin or blood-filled blister due to damage of underlying soft tissue from pressure and/or shear.  Wound Description (Comments): evolving into stage 2 in patchy areas when assessed on 8/2  Present on Admission: Yes    Nutrition Problem: Increased nutrient needs Etiology: chronic illness, cancer and cancer related treatments   FTT:  -she is declined by CIR, snf placement recommended, no bed,  -Plan to finish abx here, then home  DVT prophylaxis: Lovenox subQ Code Status: Full Family Communication: Pt in room, family not at bedside Disposition Plan: Possible d/c home in 24hrs  Consultants:   Oncology  Critical care  Ortho  Infectious disease  Procedures:   OPEN REDUCTION INTERNAL FIXATION (ORIF) HUMERAL FRACTURE on 8/14  Intubation/extubation  Single lumen picc line placement on 8/4  Antimicrobials: Anti-infectives (From admission, onward)   Start     Dose/Rate Route Frequency Ordered Stop   01/10/19 1200  ceFAZolin (ANCEF) IVPB 2g/100 mL premix     2 g 200 mL/hr over 30 Minutes Intravenous Every 8 hours 01/10/19 1048 01/22/19 2359   01/09/19 2315  ceFAZolin (ANCEF) IVPB 2g/100 mL premix  Status:  Discontinued  2 g 200 mL/hr over 30 Minutes Intravenous Every 6 hours 01/09/19 2303 01/09/19 2316   12/31/18 0000  ceFAZolin (ANCEF) IVPB     2 g Intravenous Every 8 hours 12/31/18 1554 01/23/19 2359   12/26/18 0600  ceFAZolin (ANCEF) IVPB 2g/100 mL premix     2 g 200 mL/hr over 30 Minutes Intravenous Every 8 hours 12/26/18 0445 01/09/19 2359   12/25/18 2300  vancomycin (VANCOCIN) IVPB 1000 mg/200 mL premix   Status:  Discontinued     1,000 mg 200 mL/hr over 60 Minutes Intravenous Every 12 hours 12/25/18 1101 12/26/18 0444   12/25/18 2200  ceFEPIme (MAXIPIME) 2 g in sodium chloride 0.9 % 100 mL IVPB  Status:  Discontinued     2 g 200 mL/hr over 30 Minutes Intravenous Every 8 hours 12/25/18 1447 12/26/18 0444   12/25/18 1515  ceFEPIme (MAXIPIME) 1 g in sodium chloride 0.9 % 100 mL IVPB     1 g 200 mL/hr over 30 Minutes Intravenous  Once 12/25/18 1446 12/25/18 1728   12/25/18 1115  vancomycin (VANCOCIN) 2,000 mg in sodium chloride 0.9 % 500 mL IVPB     2,000 mg 250 mL/hr over 120 Minutes Intravenous  Once 12/25/18 1101 12/25/18 1405   12/25/18 1045  ceFEPIme (MAXIPIME) 1 g in sodium chloride 0.9 % 100 mL IVPB     1 g 200 mL/hr over 30 Minutes Intravenous  Once 12/25/18 1022 12/25/18 1207      Subjective: Asking about going home  Objective: Vitals:   01/21/19 2121 01/22/19 0635 01/22/19 0849 01/22/19 1330  BP: (!) 159/69 104/83  127/66  Pulse: (!) 103 (!) 103  (!) 106  Resp: 17 17  20   Temp: 98.3 F (36.8 C) 98.2 F (36.8 C)  98 F (36.7 C)  TempSrc: Oral Oral    SpO2: 100% 100% 98% 100%  Weight:  82.9 kg    Height:        Intake/Output Summary (Last 24 hours) at 01/22/2019 1727 Last data filed at 01/22/2019 1300 Gross per 24 hour  Intake 580 ml  Output 251 ml  Net 329 ml   Filed Weights   01/17/19 0437 01/19/19 0521 01/22/19 0635  Weight: 83.2 kg 86.2 kg 82.9 kg    Examination: General exam: Awake, laying in bed, in nad Respiratory system: Normal respiratory effort, no wheezing  Data Reviewed: I have personally reviewed following labs and imaging studies  CBC: Recent Labs  Lab 01/17/19 0500 01/19/19 0431 01/22/19 0444  WBC 8.3 7.6 7.8  NEUTROABS 5.5 4.7  --   HGB 8.3* 8.1* 8.1*  HCT 27.9* 27.0* 28.0*  MCV 108.6* 108.4* 109.8*  PLT 436* 432* 0000000*   Basic Metabolic Panel: Recent Labs  Lab 01/17/19 0500 01/19/19 0431 01/20/19 0418 01/21/19 0356 01/22/19  0444  NA 137 139 137 139 141  K 3.4* 3.1* 3.6 3.8 3.8  CL 101 99 101 104 104  CO2 28 30 30 29 28   GLUCOSE 110* 112* 117* 125* 112*  BUN 16 15 12 14 14   CREATININE 0.38* 0.40* 0.31* 0.42* 0.41*  CALCIUM 7.3* 7.5* 7.6* 7.9* 8.1*  MG 2.2  --   --   --   --    GFR: Estimated Creatinine Clearance: 76.3 mL/min (A) (by C-G formula based on SCr of 0.41 mg/dL (L)). Liver Function Tests: No results for input(s): AST, ALT, ALKPHOS, BILITOT, PROT, ALBUMIN in the last 168 hours. No results for input(s): LIPASE, AMYLASE in the last 168 hours.  No results for input(s): AMMONIA in the last 168 hours. Coagulation Profile: No results for input(s): INR, PROTIME in the last 168 hours. Cardiac Enzymes: No results for input(s): CKTOTAL, CKMB, CKMBINDEX, TROPONINI in the last 168 hours. BNP (last 3 results) No results for input(s): PROBNP in the last 8760 hours. HbA1C: No results for input(s): HGBA1C in the last 72 hours. CBG: No results for input(s): GLUCAP in the last 168 hours. Lipid Profile: No results for input(s): CHOL, HDL, LDLCALC, TRIG, CHOLHDL, LDLDIRECT in the last 72 hours. Thyroid Function Tests: No results for input(s): TSH, T4TOTAL, FREET4, T3FREE, THYROIDAB in the last 72 hours. Anemia Panel: No results for input(s): VITAMINB12, FOLATE, FERRITIN, TIBC, IRON, RETICCTPCT in the last 72 hours. Sepsis Labs: No results for input(s): PROCALCITON, LATICACIDVEN in the last 168 hours.  Recent Results (from the past 240 hour(s))  Novel Coronavirus, NAA (hospital order; send-out to ref lab)     Status: None   Collection Time: 01/14/19 12:42 PM   Specimen: Nasopharyngeal Swab; Respiratory  Result Value Ref Range Status   SARS-CoV-2, NAA NOT DETECTED NOT DETECTED Final    Comment: (NOTE) This test was developed and its performance characteristics determined by [employer]. This test has not been FDA cleared or approved. This test has been authorized by FDA under an Emergency Use  Authorization (EUA). This test is only authorized for the duration of time the declaration that circumstances exist justifying the authorization of the emergency use of in vitro diagnostic tests for detection of SARS-CoV-2 virus and/or diagnosis of COVID-19 infection under section 564(b)(1) of the Act, 21 U.S.C. KA:123727), unless the authorization is terminated or revoked sooner. When diagnostic testing is negative, the possibility of a false negative result should be considered in the context of a patient's recent exposures and the presence of clinical signs and symptoms consistent with COVID-19. An individual without symptoms of COVID-19 and who is not shedding SARS-CoV-2 virus would expect to have a negative (not detected) result in this assay. Performed  At: [facility] [place], [place] [id number] Rush Farmer MD [id number]    [place]  Final    Comment: Performed at [facility] [address] [place], [place] [address]  Culture, Urine     Status: Abnormal   Collection Time: 01/15/19 11:50 AM   Specimen: Urine, Random  Result Value Ref Range Status   Specimen Description   Final    URINE, RANDOM Performed at [facility] [address] [place], [place] [address]    Special Requests   Final    NONE Performed at [facility] [place] [address] [place],  [address]    Culture 10,000 COLONIES/mL ENTEROBACTER ASBURIAE (A)  Final   Report Status 01/17/2019 FINAL  Final   Organism ID, Bacteria ENTEROBACTER ASBURIAE (A)  Final      Susceptibility   Enterobacter asburiae - MIC*    CEFAZOLIN >=64 RESISTANT Resistant     CEFTRIAXONE >=64 RESISTANT Resistant     CIPROFLOXACIN <=0.25 SENSITIVE Sensitive     GENTAMICIN <=1 SENSITIVE Sensitive     IMIPENEM <=0.25 SENSITIVE Sensitive     NITROFURANTOIN 64 INTERMEDIATE Intermediate     TRIMETH/SULFA <=20 SENSITIVE  Sensitive     PIP/TAZO >=128 RESISTANT Resistant     * 10,000 COLONIES/mL ENTEROBACTER ASBURIAE  Novel Coronavirus, NAA (hospital order; send-out to ref lab)     Status: None   Collection Time: 01/18/19  5:40 PM  Result Value Ref Range Status   SARS-CoV-2, NAA NOT DETECTED NOT DETECTED Final    Comment: (NOTE) This test was developed and its performance characteristics determined by [employer]. This test has not been FDA cleared or approved. This test has been authorized by FDA under an Emergency Use Authorization (EUA). This test is only authorized for the duration of time the declaration that circumstances exist justifying the authorization of the emergency use of in vitro diagnostic tests for detection of SARS-CoV-2 virus and/or diagnosis of COVID-19 infection under section 564(b)(1) of the Act, 21 U.S.C. KA:123727), unless the authorization is terminated or revoked sooner. When diagnostic testing is negative, the possibility of a false negative result should be considered in the context of a patient's recent exposures and the presence of clinical signs and symptoms consistent with COVID-19. An individual without symptoms of COVID-19 and who is not shedding SARS-CoV-2 virus would expect to have a negative (not detected) result in this assay. Performed  At: [facility] [place], [place] [id number] Rush Farmer MD [id number]    [place]  Final    Comment: Performed at [facility] [address], [place], [place] [address]     Radiology Studies: No results found.  Scheduled Meds: . anastrozole  1 mg Oral Daily  . carvedilol  3.125 mg Oral BID WC  . chlorhexidine  15 mL Mouth Rinse BID  . Chlorhexidine Gluconate Cloth  6 each Topical Daily  . enoxaparin (LOVENOX) injection  40 mg Subcutaneous Q24H  . feeding supplement  1 Container Oral TID BM  . feeding supplement (ENSURE ENLIVE)  237 mL  Oral BID BM  . guaiFENesin  600 mg Oral BID  . ipratropium-albuterol  3 mL Nebulization BID  . mouth rinse  15 mL Mouth Rinse q12n4p  . multivitamin with minerals  1 tablet Oral Daily  . nystatin   Topical TID  . pantoprazole (PROTONIX) IV  40 mg Intravenous Q24H   Continuous Infusions: .  ceFAZolin (ANCEF) IV 2 g (01/22/19 1344)     LOS: 28 days   Marylu Lund, MD [facility] Pager On Amion  If 7PM-7AM, please contact night-coverage 01/22/2019, 5:27 PM

## 2019-01-22 NOTE — Progress Notes (Addendum)
Occupational Therapy Treatment Patient Details Name: Suzanne [name] MRN: [id number] DOB: 1960-06-29 Today's Date: 01/22/2019    History of present illness Pt admitted with acute on chronic respiratory failure and hx of breast CA with mets to bone (currently with L UE in sling 2* pathologic fx) and lungs, chronic diastolic heart failure, anemia and pleural effusion. Pt now s/p LUE ORIF 8/14 and off vent 8/16   OT comments  Pt was very limited by back pain today; RN brought medication.  Reviewed ADL without moving shoulder. Pt states she may be leaving tomorrow.  Would be beneficial to see family for further education, if pt d/c's home.   Follow Up Recommendations  SNF;Supervision/Assistance - 24 hour(if home, HHOT)    Equipment Recommendations  None recommended by OT    Recommendations for Other Services      Precautions / Restrictions Precautions Precautions: Shoulder;Fall Type of Shoulder Precautions: Elbow, wrist, hand ROM ok; Pendulums and scap retraction ok; No AROM/PROM to shoulder Shoulder Interventions: Shoulder sling/immobilizer Precaution Comments: h/o given and reviewed precuations Required Braces or Orthoses: Sling Restrictions LUE Weight Bearing: Non weight bearing Other Position/Activity Restrictions: L UE       Mobility Bed Mobility         Supine to sit: Min assist;HOB elevated Sit to supine: Min assist   General bed mobility comments: use of rail  Transfers                      Balance                                           ADL either performed or assessed with clinical judgement   ADL                                         General ADL Comments: Pt seen to review shoulder protocol. She initially tried to sit up but back was hurting her so much that she was crying. Returned to supine and continued education from there.  washed under arm by seesawing and slid gown sleeve over LUE.  Sling was inside  out and had slid way back. Reapplied this. Sling is very large for her arm:  asked Ortho Tech to come look for a smaller size.  Worked on elbow ROM while out of sling and reinforced pillow under arm when in bed.       Vision       Perception     Praxis      Cognition Arousal/Alertness: Awake/alert Behavior During Therapy: WFL for tasks assessed/performed Overall Cognitive Status: Within Functional Limits for tasks assessed                                          Exercises Exercises: Other exercises Other Exercises Other Exercises: pt performed elbow ROM from supine as well as supination/pronation. She has been performing wrist and finger on her own.   Other Exercises: Dr Stann Mainland note states that pt can do pendulums and scapula retraction.  Pt will not tolerate pendulums at this time due to back pain.  Did not perform scapula retraction   Shoulder Instructions  General Comments did not tolerate sitting EOB this session.  Continued educated and exercises from supine position due to back pain. Pt states her back never hurts that much    Pertinent Vitals/ Pain       Pain Assessment: Faces Faces Pain Scale: Hurts worst Pain Location: back when sitting up Pain Descriptors / Indicators: Crying Pain Intervention(s): Repositioned;Patient requesting pain meds-RN notified  Home Living                                          Prior Functioning/Environment              Frequency           Progress Toward Goals  OT Goals(current goals can now be found in the care plan section)  Progress towards OT goals: Progressing toward goals     Plan      Co-evaluation                 AM-PAC OT "6 Clicks" Daily Activity     Outcome Measure   Help from another person eating meals?: A Little Help from another person taking care of personal grooming?: A Little Help from another person toileting, which includes using toliet, bedpan,  or urinal?: A Lot Help from another person bathing (including washing, rinsing, drying)?: A Lot Help from another person to put on and taking off regular upper body clothing?: A Lot Help from another person to put on and taking off regular lower body clothing?: A Lot 6 Click Score: 14    End of Session    OT Visit Diagnosis: Other abnormalities of gait and mobility (R26.89);Muscle weakness (generalized) (M62.81);Unsteadiness on feet (R26.81);Pain Pain - Right/Left: Left Pain - part of body: Shoulder   Activity Tolerance Patient limited by pain   Patient Left in bed;with call bell/phone within reach   Nurse Communication          Time:  - 1413-1441   27 minutes Charges: OT General Charges $OT Visit: 1 Visit OT Treatments $Self Care/Home Management : 8-22 mins $Therapeutic Exercise: 8-22 mins  Lesle Chris, OTR/L Acute Rehabilitation Services [phone] [place] pager [phone] office 01/22/2019   [place] 01/22/2019, 2:58 PM

## 2019-01-22 NOTE — TOC Progression Note (Addendum)
Transition of Care [facility]) - Progression Note    Patient Details  Name: Suzanne [name] MRN: [id number] Date of Birth: [birth year]  Transition of Care [facility]) CM/SW [place], [place] Phone Number: 01/22/2019, 9:32 AM  Clinical Narrative:    10:45am Patient IV antibiotics completed today. CSW confirm with daughter the ramp is being built now and will be completed by 1:00pm today per volunteer onsite.  Patient currently has a rented Radio broadcast assistant, [place] reached out to [place] [employer] for a  wheelchair. CSW notified physician for orders.   CSW reached out to Rep. Dorian Pod with [facility] (through charity) - they are unable to accept the patient back and provide services. Rep states the patient does not have a safe discharge plan home. PT/OT notes indicate SNF placement and the patient declines.   CSW reached out to [facility] leadership for additional options, if any.   Patient is requiring Heavy Duty wheelchair. DME agency [employer] does not have any currently in stock that can be delivered to the patient room. The wheelchair will have to be delivered to the patient home. Daughter reports the patient will keep the rented wheelchair until the new chair is delivered to her home.       Expected Discharge Plan and Services  Home          Expected Discharge Date: (unknown)                                   Social Determinants of Health (SDOH) Interventions    Readmission Risk Interventions Readmission Risk Prevention Plan 01/16/2019 12/01/2018 11/17/2018  Transportation Screening Complete Complete Complete  PCP or Specialist Appt within 3-5 Days - - Not Complete  Not Complete comments - - will need pcp set up  [place] or [place] - - Complete  Social Work Consult for [place] Planning/Counseling - - Complete  Palliative Care Screening - - Not Complete  Palliative Care Screening Not Complete Comments - - pending- bedside RN states plan to consult palliative this admission   Medication Review ([place]) Complete Complete Complete  PCP or Specialist appointment within 3-5 days of discharge Complete Complete -  [place] or Home Care Consult Complete Complete -  SW Recovery Care/Counseling Consult Complete Complete -  Palliative Care Screening Not Applicable Not Applicable -  Skilled Nursing Facility Complete Complete -  Some recent data might be hidden

## 2019-01-22 NOTE — Progress Notes (Signed)
Assessed bio patch placement, and the patch is properly placed over the insertion site. Dressing is clean, dry and intact, does not need an intervention at this time

## 2019-01-22 NOTE — Progress Notes (Signed)
    Durable Medical Equipment  (From admission, onward)         Start     Ordered   01/22/19 1540  For home use only DME standard manual wheelchair with seat cushion  Once    Comments: Patient suffers from cancer which impairs their ability to perform daily activities like walking in the home.  A walker will not resolve issue with performing activities of daily living. A heavy duty wheelchair will allow patient to safely perform daily activities. Patient can safely propel the heavy duty wheelchair in the home or has a caregiver who can provide assistance. Length of need lifetime. Accessories: elevating leg rests (ELRs), wheel locks, extensions and anti-tippers.  Heavy Duty Wheelchair   01/22/19 1541

## 2019-01-23 DIAGNOSIS — J029 Acute pharyngitis, unspecified: Secondary | ICD-10-CM

## 2019-01-23 MED ORDER — PALBOCICLIB 100 MG PO TABS
100.0000 mg | ORAL_TABLET | Freq: Every day | ORAL | Status: DC
  Administered 2019-01-24: 100 mg via ORAL

## 2019-01-23 MED ORDER — FUROSEMIDE 40 MG PO TABS
40.0000 mg | ORAL_TABLET | Freq: Two times a day (BID) | ORAL | Status: DC
  Administered 2019-01-23 – 2019-01-24 (×2): 40 mg via ORAL
  Filled 2019-01-23 (×2): qty 1

## 2019-01-23 NOTE — Progress Notes (Signed)
Nutrition Follow-up  DOCUMENTATION CODES:   Obesity unspecified  INTERVENTION:   -Continue Ensure Enlive po BID, each supplement provides 350 kcal and 20 grams of protein -Continue Magic cup BID with meals, each supplement provides 290 kcal and 9 grams of protein -Continue Multivitamin with minerals daily -D/c Boost Breeze  NUTRITION DIAGNOSIS:   Increased nutrient needs related to chronic illness, cancer and cancer related treatments as evidenced by estimated needs.  Ongoing  GOAL:   Patient will meet greater than or equal to 90% of their needs  Progressing  MONITOR:   PO intake, Supplement acceptance, Labs, Weight trends, I & O's  ASSESSMENT:   58 y.o. female with past medical history significant for stage 4 breast cancer with metastasis to bone, lung, and lymph node, chronic hypoxic respiratory failure on 2L of O2, CHF, malignant pleural effusion diagnosed in 10/2018. She presented to the ED complaining of worsening shortness of breath that started the morning of admission.  **RD working remotely**  Patient consumed 60% of breakfast this morning. Consumed 25-50% of meals yesterday 8/27. Pt is drinking Ensure supplements, prefers these over [facility], will d/c.  Per chart review, pt awaiting discharge.  Admission weight: 192 lbs. Current weight: 182 lbs. I/Os: -1.1L since 8/14 UOP 8/27: 550 ml  Labs reviewed. Medications: Multivitamin with minerals daily  Diet Order:   Diet Order            DIET DYS 3 Room service appropriate? Yes; Fluid consistency: Thin  Diet effective now              EDUCATION NEEDS:   Not appropriate for education at this time  Skin:  Skin Assessment: Skin Integrity Issues: Skin Integrity Issues:: DTI DTI: sacrum  Last BM:  8/27  Height:   Ht Readings from Last 1 Encounters:  12/27/18 5\' 3"  (1.6 m)    Weight:   Wt Readings from Last 1 Encounters:  01/22/19 82.9 kg    Ideal Body Weight:  52.3 kg  BMI:  Body mass  index is 32.37 kg/m.  Estimated Nutritional Needs:   Kcal:  [id number] kcal  Protein:  130-140 grams  Fluid:  >/= 1.5 L/day  Clayton Bibles, MS, RD, LDN [facility] Pager: [phone] After Hours Pager: [phone]

## 2019-01-23 NOTE — Progress Notes (Signed)
PROGRESS NOTE    MARIACRISTINA KUZNIK  [id number] DOB: 03/23/1961 DOA: 12/25/2018 PCP: Patient, No Pcp Per    Brief Narrative:  58 year old with past medical history significant for stage IV breast cancer with known metastasis to bone, lung and lymph nodes and malignant pleural effusion admitted on 12/25/2018 with worsening shortness of breath and altered mental status. Of note patient was diagnosed with metastatic breast cancer in early June 2020. At the time of diagnosis she was found to have widespread metastatic disease and pathologic fracture in her right right clavicle. She subsequently underwent neoadjuvant chemotherapy but has also had her treatment complicated by recurrent malignant right-sided pleural effusion, neutropenia and thrombocytopenia.    In the emergency room patient was noted to be septic, likely secondary to sacral decubital ulcer, blood cultures on admission grew MSSA. ID was consulted and recommendation was for 4 weeks of Ancef, which finish on 8/27. Patient was also found to have a pathologic left humeral fracture. Examination by orthopedic surgery recommended surgery this admission.   Patient underwentleft Humerus fracture open reduction and internal fixation using Bionmet ALPS plate on8/14.Post surgery patient develop acute respiratory distress and she was reintubated. Patient was subsequently extubated on 8/15, she was placed on BiPAP. Her respiratory status has been stable, on BiPAP at bedtime. Respiratory failure was thought to be related to pulmonary edema and pleural effusion and chronic elevation of the right hemi-diaphragma.she was a started on IV Lasix.   Assessment & Plan:   Principal Problem:   Malignant pleural effusion Active Problems:   Bone metastasis (HCC)   Pleural effusion, bilateral   Carcinoma of breast, estrogen receptor positive, stage 4, right (HCC)   Acute on chronic respiratory failure with hypoxia (HCC)   Pressure injury of  skin   Pneumonia   Respiratory failure, acute (HCC)   Fracture closed, humerus, shaft   Acute on chronic diastolic CHF (congestive heart failure) (HCC)   Bacteremia   FTT (failure to thrive) in adult  Acute on chronic hypoxic respiratory failure, likely  Multifactorial including malignant pleural effusionand a/c diast CHF -Patient presented with worsening hypoxia and admitted on 12/25/18. cxr with known bilateral pleural effusions on presentation. cta on presentation was negative for PE -She has a known right malignant pleural effusion. Last thoracentesis was 6/20. Per chart review, there was some concern of possible lymphangitic spread of tumor in the lungs. -She was initially on IV Solu-Medrol which has been discontinued. -Patient had to be re-intubated post shoulder surgery 8/14, extubated on 8/15 -  repeat cxr on 8/22showed recurrent right pleural effusion,  IR consulted for right sided thoracentesis > pt went to IR8/25 where [place] only showed minimal fluid and they tried to aspirate some but the procedure was difficult because of her L arm surg she couldn't lie on the L side , so they had to abort. Not sure yet if any fluid was sent off >  From this we must assume that the RLL xray changes are related to [facility] +/- atelectasis. No septations were seen by [place], reviewed -Issues with disposition noted. Pt to complete course of abx 8/27. Plan to d/c home with home health over weekend   Stage IV metastatic breast cancer to bone and lung: -Oncology Dr Jana Hakim follow up appreciated.. - anastrozole, palbociclib and denosumab/xgeva per oncology - Remains stable at present   Acute on chronic diastolic chf:- -currently on BIPAP at HS and PRN during day, nasal cannula day time -Continue with incentive spirometry, out of bed.  -  she has had many days of IV lasix 20 bid, decreased yest to 20 mg IV qd and will dc today - no sig vol overload on exam at this point  MSSA bacteremia/sepsis;  -Sepsis present on admission. Likely source a stage II/III sacral decubitus ulcer. -ID consultation was obtained. -Sacral wound did not require any surgical intervention.. -TTE did not show any signs of endocarditis.. -Repeated blood cultures on 7/30 first did not show any growth -Per ID recommendation, patient is currently on 4 weeks course of Ancefuntil 8/27 - single lumen PICC line to right arm placed on 8/4. 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 -Seen by radiation oncology.  -Seen by Dr. Veverly Fells with orthopedic who recommended surgery this admission. -Dr. [place] Pink discussed case with Dr. Jana Hakim and orthopedic doctor and decision was made to proceed with surgery this admission >> so underwent left Humerus fracture open reduction and internal fixation using Bionmet ALPS plate on [id number] Has sling to arm, follow with ortho  Dr Veverly Fells in two weeks,  Elevated d-dimer: -likely from active cancer, CT angio chest was negative for PE, Dopplers of lower extremity negative for DVT. -Stable at this time  Elevated alkaline phosphatase: -Secondary to metastatic bone disease. -Presently stable  HTN:  -BP stable -Cont currently coreg, lasix  Stage 2-3 decubitus ulcer sacrum present on admission Local care.  Pressure Injury 12/25/18 Sacrum Mid Deep Tissue  Injury - Purple or maroon localized area of discolored intact skin or blood-filled blister due to damage of underlying soft tissue from pressure and/or shear. evolving into stage 2 in patchy areas when asse (Active)  12/25/18 1700  Location: Sacrum  Location Orientation: Mid  Staging: Deep Tissue Injury - Purple or maroon localized area of discolored intact skin or blood-filled blister due to damage of underlying soft tissue from pressure and/or shear.  Wound Description (Comments): evolving into stage 2 in patchy areas when assessed on 8/2  Present on Admission: Yes    Nutrition Problem: Increased nutrient needs Etiology: chronic illness, cancer and cancer related treatments   FTT:  -she is declined by CIR, snf placement recommended, no bed,  -Plan to finish abx here, then home  DVT prophylaxis: Lovenox subQ Code Status: Full Family Communication: Pt in room, family not at bedside Disposition Plan: Possible d/c home in 24hrs  Consultants:   Oncology  Critical care  Ortho  Infectious disease  Procedures:   OPEN REDUCTION INTERNAL FIXATION (ORIF) HUMERAL FRACTURE on 8/14  Intubation/extubation  Single lumen picc line placement on 8/4  Antimicrobials: Anti-infectives (From admission, onward)   Start     Dose/Rate Route Frequency Ordered Stop   01/10/19 1200  ceFAZolin (ANCEF) IVPB 2g/100 mL premix     2 g 200 mL/hr over 30 Minutes Intravenous Every 8 hours 01/10/19 1048 01/22/19 2214   01/09/19 2315  ceFAZolin (ANCEF) IVPB 2g/100 mL premix  Status:  Discontinued     2 g 200 mL/hr over 30 Minutes Intravenous Every 6 hours 01/09/19 2303 01/09/19 2316   12/31/18 0000  ceFAZolin (ANCEF) IVPB     2 g Intravenous Every 8 hours 12/31/18 1554 01/23/19 2359   12/26/18 0600  ceFAZolin (ANCEF) IVPB 2g/100 mL premix     2 g 200 mL/hr over 30 Minutes Intravenous Every 8 hours 12/26/18 0445 01/09/19 2359   12/25/18 2300  vancomycin (VANCOCIN) IVPB 1000 mg/200 mL premix   Status:  Discontinued     1,000 mg 200 mL/hr over 60 Minutes Intravenous Every 12 hours 12/25/18 1101 12/26/18 0444   12/25/18 2200  ceFEPIme (MAXIPIME) 2 g in sodium chloride 0.9 % 100 mL IVPB  Status:  Discontinued     2 g 200 mL/hr over 30 Minutes Intravenous Every 8 hours 12/25/18 1447 12/26/18 0444   12/25/18 1515  ceFEPIme (MAXIPIME) 1 g in sodium chloride 0.9 % 100 mL IVPB     1 g 200 mL/hr over 30 Minutes Intravenous  Once 12/25/18 1446 12/25/18 1728   12/25/18 1115  vancomycin (VANCOCIN) 2,000 mg in sodium chloride 0.9 % 500 mL IVPB     2,000 mg 250 mL/hr over 120 Minutes Intravenous  Once 12/25/18 1101 12/25/18 1405   12/25/18 1045  ceFEPIme (MAXIPIME) 1 g in sodium chloride 0.9 % 100 mL IVPB     1 g 200 mL/hr over 30 Minutes Intravenous  Once 12/25/18 1022 12/25/18 1207      Subjective: Eager to go home  Objective: Vitals:   01/22/19 2048 01/23/19 0602 01/23/19 0858 01/23/19 1420  BP: 138/60 134/62  128/66  Pulse: (!) 107 (!) 106  (!) 105  Resp: 18 16  20   Temp: 98.7 F (37.1 C) 97.7 F (36.5 C)  (!) 97.4 F (36.3 C)  TempSrc: Oral Oral  Oral  SpO2: 100% 98% 98% 100%  Weight:      Height:        Intake/Output Summary (Last 24 hours) at 01/23/2019 1607 Last data filed at 01/23/2019 1300 Gross per 24 hour  Intake 970 ml  Output 803 ml  Net 167 ml   Filed Weights   01/17/19 0437 01/19/19 0521 01/22/19 0635  Weight: 83.2 kg 86.2 kg 82.9 kg    Examination: General exam: Conversant, in no acute distress Respiratory system: normal chest rise, clear, no audible wheezing  Data Reviewed: I have personally reviewed following labs and imaging studies  CBC: Recent Labs  Lab 01/17/19 0500 01/19/19 0431 01/22/19 0444  WBC 8.3 7.6 7.8  NEUTROABS 5.5 4.7  --   HGB 8.3* 8.1* 8.1*  HCT 27.9* 27.0* 28.0*  MCV 108.6* 108.4* 109.8*  PLT 436* 432* 0000000*   Basic Metabolic Panel: Recent Labs  Lab 01/17/19 0500 01/19/19 0431 01/20/19 0418 01/21/19 0356 01/22/19  0444  NA 137 139 137 139 141  K 3.4* 3.1* 3.6 3.8 3.8  CL 101 99 101 104 104  CO2 28 30 30 29 28   GLUCOSE 110* 112* 117* 125* 112*  BUN 16 15 12 14 14   CREATININE 0.38* 0.40* 0.31* 0.42* 0.41*  CALCIUM 7.3* 7.5* 7.6* 7.9* 8.1*  MG 2.2  --   --   --   --    GFR: Estimated Creatinine Clearance: 76.3 mL/min (A) (by C-G formula based on SCr of 0.41 mg/dL (L)). Liver Function Tests: No results for input(s): AST, ALT, ALKPHOS, BILITOT, PROT, ALBUMIN in the last 168 hours. No results for input(s): LIPASE, AMYLASE  in the last 168 hours. No results for input(s): AMMONIA in the last 168 hours. Coagulation Profile: No results for input(s): INR, PROTIME in the last 168 hours. Cardiac Enzymes: No results for input(s): CKTOTAL, CKMB, CKMBINDEX, TROPONINI in the last 168 hours. BNP (last 3 results) No results for input(s): PROBNP in the last 8760 hours. HbA1C: No results for input(s): HGBA1C in the last 72 hours. CBG: No results for input(s): GLUCAP in the last 168 hours. Lipid Profile: No results for input(s): CHOL, HDL, LDLCALC, TRIG, CHOLHDL, LDLDIRECT in the last 72 hours. Thyroid Function Tests: No results for input(s): TSH, T4TOTAL, FREET4, T3FREE, THYROIDAB in the last 72 hours. Anemia Panel: No results for input(s): VITAMINB12, FOLATE, FERRITIN, TIBC, IRON, RETICCTPCT in the last 72 hours. Sepsis Labs: No results for input(s): PROCALCITON, LATICACIDVEN in the last 168 hours.  Recent Results (from the past 240 hour(s))  Novel Coronavirus, NAA (hospital order; send-out to ref lab)     Status: None   Collection Time: 01/14/19 12:42 PM   Specimen: Nasopharyngeal Swab; Respiratory  Result Value Ref Range Status   SARS-CoV-2, NAA NOT DETECTED NOT DETECTED Final    Comment: (NOTE) This test was developed and its performance characteristics determined by [employer]. This test has not been FDA cleared or approved. This test has been authorized by FDA under an Emergency Use  Authorization (EUA). This test is only authorized for the duration of time the declaration that circumstances exist justifying the authorization of the emergency use of in vitro diagnostic tests for detection of SARS-CoV-2 virus and/or diagnosis of COVID-19 infection under section 564(b)(1) of the Act, 21 U.S.C. KA:123727), unless the authorization is terminated or revoked sooner. When diagnostic testing is negative, the possibility of a false negative result should be considered in the context of a patient's recent exposures and the presence of clinical signs and symptoms consistent with COVID-19. An individual without symptoms of COVID-19 and who is not shedding SARS-CoV-2 virus would expect to have a negative (not detected) result in this assay. Performed  At: [facility] [place], [place] [id number] Rush Farmer MD [id number]    [place]  Final    Comment: Performed at [facility] [address], [place], [place] [address]  Culture, Urine     Status: Abnormal   Collection Time: 01/15/19 11:50 AM   Specimen: Urine, Random  Result Value Ref Range Status   Specimen Description   Final    URINE, RANDOM Performed at [facility] [address], [place], [place] [address]    Special Requests   Final    NONE Performed at [facility], [place] [address] [place], [place] [address]    Culture 10,000 COLONIES/mL ENTEROBACTER ASBURIAE (A)  Final   Report Status 01/17/2019 FINAL  Final   Organism ID, Bacteria ENTEROBACTER ASBURIAE (A)  Final      Susceptibility   Enterobacter asburiae - MIC*    CEFAZOLIN >=64 RESISTANT Resistant     CEFTRIAXONE >=64 RESISTANT Resistant     CIPROFLOXACIN <=0.25 SENSITIVE Sensitive     GENTAMICIN <=1 SENSITIVE Sensitive     IMIPENEM <=0.25 SENSITIVE Sensitive     NITROFURANTOIN 64 INTERMEDIATE Intermediate     TRIMETH/SULFA <=20 SENSITIVE  Sensitive     PIP/TAZO >=128 RESISTANT Resistant     * 10,000 COLONIES/mL ENTEROBACTER ASBURIAE  Novel Coronavirus, NAA (hospital order; send-out to ref lab)     Status: None   Collection Time:  01/18/19  5:40 PM  Result Value Ref Range Status   SARS-CoV-2, NAA NOT DETECTED NOT DETECTED Final    Comment: (NOTE) This test was developed and its performance characteristics determined by [employer]. This test has not been FDA cleared or approved. This test has been authorized by FDA under an Emergency Use Authorization (EUA). This test is only authorized for the duration of time the declaration that circumstances exist justifying the authorization of the emergency use of in vitro diagnostic tests for detection of SARS-CoV-2 virus and/or diagnosis of COVID-19 infection under section 564(b)(1) of the Act, 21 U.S.C. KA:123727), unless the authorization is terminated or revoked sooner. When diagnostic testing is negative, the possibility of a false negative result should be considered in the context of a patient's recent exposures and the presence of clinical signs and symptoms consistent with COVID-19. An individual without symptoms of COVID-19 and who is not shedding SARS-CoV-2 virus would expect to have a negative (not detected) result in this assay. Performed  At: [facility] [place], [place] [id number] Rush Farmer MD [id number]    [place]  Final    Comment: Performed at [facility] [address], [place], [place] [address]     Radiology Studies: No results found.  Scheduled Meds: . anastrozole  1 mg Oral Daily  . carvedilol  3.125 mg Oral BID WC  . chlorhexidine  15 mL Mouth Rinse BID  . Chlorhexidine Gluconate Cloth  6 each Topical Daily  . enoxaparin (LOVENOX) injection  40 mg Subcutaneous Q24H  . feeding supplement (ENSURE ENLIVE)  237 mL Oral BID BM  . guaiFENesin  600 mg Oral BID  .  ipratropium-albuterol  3 mL Nebulization BID  . mouth rinse  15 mL Mouth Rinse q12n4p  . multivitamin with minerals  1 tablet Oral Daily  . nystatin   Topical TID  . palbociclib  100 mg Oral Daily  . pantoprazole (PROTONIX) IV  40 mg Intravenous Q24H   Continuous Infusions:    LOS: 29 days   Marylu Lund, MD [facility] Pager On Amion  If 7PM-7AM, please contact night-coverage 01/23/2019, 4:07 PM

## 2019-01-23 NOTE — Progress Notes (Signed)
Occupational Therapy Treatment Patient Details Name: Suzanne [name] MRN: [id number] DOB: 23-Mar-1961 Today's Date: 01/23/2019    History of present illness Pt admitted with acute on chronic respiratory failure and hx of breast CA with mets to bone (currently with L UE in sling 2* pathologic fx) and lungs, chronic diastolic heart failure, anemia and pleural effusion. Pt now s/p LUE ORIF 8/14 and off vent 8/16   OT comments  Performed UB dressing (shirt over gown) but removed as pt has PICC connected. Pt verbalizes understanding of how to do this. She feels that daughter will be able to adequately assist her at home.  She feels comfortable on how to have daughter assist with washing under arm and plans to do SPT to [facility] initially.  She has a tub/shower with tub bench, but she will not be able to shower until Dr Veverly Fells clears her; and she really doesn't have the endurance for this right now.     Follow Up Recommendations  Supervision/Assistance - 24 hour;SNF(pt plans to return home with daughter).   She would benefit from SNF, but doesn't want this option.  Pt does not qualify for [facility] therapies per notes    Equipment Recommendations  None recommended by OT (PT has recommended w/c, she has hosp bed, BSC and tub bench)   Recommendations for Other Services      Precautions / Restrictions Precautions Precautions: Shoulder;Fall Type of Shoulder Precautions: Elbow, wrist, hand ROM ok; per Dr. Dennie Maizes note, 8/15: Pendulums and scap retraction ok; No AROM/PROM to shoulder Shoulder Interventions: Shoulder sling/immobilizer Precaution Comments: handouts in room Required Braces or Orthoses: Sling Restrictions LUE Weight Bearing: Non weight bearing Other Position/Activity Restrictions: L UE       Mobility Bed Mobility         Supine to sit: Min assist Sit to supine: Min assist   General bed mobility comments: use of rail. Assist for trunk getting up and legs back to bed. Pt has a hospital bed  at home  Transfers                 General transfer comment: did not stand; pt was oob earlier    Balance                                           ADL either performed or assessed with clinical judgement   ADL                       Lower Body Dressing: Maximal assistance;Sit to/from stand                 General ADL Comments: pt sat EOB.  She feels comfortable having daughter assist her at home:  daughter has some decreased [facility] but pt feels she will be able to assist with bathing under arm, sling and donning/doffing shirt. Removed sling and put button down shirt over gown so that she could see it fit and see how to bring shirt up into axilla before wrapping around.  Pt crying/frustrated over situation and tried to really push RUE (non-operative arm through).  Cued her to go slowly as she has PICC and multiple hospital bands which would potentially catch.  Pt did not want to perform adl this session     Vision       Perception     Praxis  Cognition Arousal/Alertness: Awake/alert Behavior During Therapy: WFL for tasks assessed/performed Overall Cognitive Status: Within Functional Limits for tasks assessed                                          Exercises Other Exercises Other Exercises: pt performed elbow ROM from supine as well as supination/pronation. She has been performing wrist and finger on her own.     Shoulder Instructions       General Comments      Pertinent Vitals/ Pain       Pain Assessment: Faces Pain Score: 2   Home Living                                          Prior Functioning/Environment              Frequency  Min 2X/week        Progress Toward Goals  OT Goals(current goals can now be found in the care plan section)  Progress towards OT goals: Progressing toward goals     Plan      Co-evaluation                 AM-PAC OT "6 Clicks" Daily  Activity     Outcome Measure   Help from another person eating meals?: A Little Help from another person taking care of personal grooming?: A Little Help from another person toileting, which includes using toliet, bedpan, or urinal?: A Lot Help from another person bathing (including washing, rinsing, drying)?: A Lot Help from another person to put on and taking off regular upper body clothing?: A Lot Help from another person to put on and taking off regular lower body clothing?: A Lot 6 Click Score: 14    End of Session    OT Visit Diagnosis: Other abnormalities of gait and mobility (R26.89);Muscle weakness (generalized) (M62.81);Unsteadiness on feet (R26.81);Pain Pain - Right/Left: Left Pain - part of body: Shoulder   Activity Tolerance Patient limited by fatigue   Patient Left in bed;with call bell/phone within reach   Nurse Communication          Time: [id number] OT Time Calculation (min): 26 min  Charges: OT General Charges $OT Visit: 1 Visit OT Treatments $Self Care/Home Management : 23-37 mins  Lesle Chris, OTR/L Acute Rehabilitation Services [phone] [place] pager [phone] office 01/23/2019   Suzanne [name] 01/23/2019, 3:48 PM

## 2019-01-24 MED ORDER — PANTOPRAZOLE SODIUM 40 MG PO TBEC: 40.0000 mg | DELAYED_RELEASE_TABLET | Freq: Every day | ORAL | Status: DC

## 2019-01-24 MED ORDER — CARVEDILOL 3.125 MG PO TABS: 3.1250 mg | ORAL_TABLET | Freq: Two times a day (BID) | ORAL | 0 refills | Status: DC

## 2019-01-24 MED ORDER — ALPRAZOLAM 0.25 MG PO TABS: 0.2500 mg | ORAL_TABLET | Freq: Three times a day (TID) | ORAL | 0 refills | Status: DC | PRN

## 2019-01-24 MED ORDER — IPRATROPIUM-ALBUTEROL 0.5-2.5 (3) MG/3ML IN SOLN
3.0000 mL | RESPIRATORY_TRACT | Status: DC | PRN
  Administered 2019-01-24: 3 mL via RESPIRATORY_TRACT

## 2019-01-24 MED ORDER — OXYCODONE HCL 5 MG PO TABS: 2.5000 mg | ORAL_TABLET | ORAL | 0 refills | Status: DC | PRN

## 2019-01-24 NOTE — Progress Notes (Signed)
Patient discharged to home with daughter, Discharge instructions reviewed with patient and daughter who verbalized understanding. Home health arranged by Education officer, museum. Patient transported via [place].

## 2019-01-24 NOTE — TOC Transition Note (Signed)
Transition of Care [facility]) - CM/SW Discharge Note   Patient Details  Name: QUANAH SLINGER MRN: [id number] Date of Birth: [birth year]  Transition of Care [facility]) CM/SW Contact:  Nila Nephew, LCSW Phone Number: [phone] 01/24/2019, 10:35 AM   Clinical Narrative:   Pt's care needs resolved at home- entrance ramp complete, wheelchair and quad cane will be provided by [employer] (being delivered to pt's home). Daughter [name] prepared for pt's return home and [facility] to resume [facility] services (PT/RN/aide/SW ordered). Will arrange ambulance transport home at DC.  Daughter coming today prior to DC to bring pt's belongings home.           Patient Goals and CMS Choice    [facility]    Discharge Placement               home- home health         Social Determinants of Health (SDOH) Interventions     Readmission Risk Interventions Readmission Risk Prevention Plan 01/16/2019 12/01/2018 11/17/2018  Transportation Screening Complete Complete Complete  PCP or Specialist Appt within 3-5 Days - - Not Complete  Not Complete comments - - will need pcp set up  [place] or [place] - - Complete  Social Work Consult for [place] Planning/Counseling - - Complete  Palliative Care Screening - - Not Complete  Palliative Care Screening Not Complete Comments - - pending- bedside RN states plan to consult palliative this admission  Medication Review ([place]) Complete Complete Complete  PCP or Specialist appointment within 3-5 days of discharge Complete Complete -  [place] or Home Care Consult Complete Complete -  SW Recovery Care/Counseling Consult Complete Complete -  Palliative Care Screening Not Applicable Not Applicable -  Skilled Nursing Facility Complete Complete -  Some recent data might be hidden

## 2019-01-24 NOTE — Progress Notes (Signed)
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 Pt now s/p LUE ORIF 8/14 and off vent 8/16   OT comments  Plan is for pt to DC home with pts daughter this day.  Daughter will provide 24/7 A  Follow Up Recommendations  Home health OT;Supervision/Assistance - 24 hour    Equipment Recommendations  None recommended by OT    Recommendations for Other Services      Precautions / Restrictions Precautions Precautions: Fall Type of Shoulder Precautions: Elbow, wrist, hand ROM ok; per Dr. Dennie Maizes note, 8/15: Pendulums and scap retraction ok; No AROM/PROM to shoulder Shoulder Interventions: Shoulder sling/immobilizer Restrictions LUE Weight Bearing: Non weight bearing Other Position/Activity Restrictions: L UE       Mobility Bed Mobility         Supine to sit: Min assist Sit to supine: Min assist   General bed mobility comments: use of rail. Assist for trunk getting up and legs back to bed. Pt has a hospital bed at home  Transfers Overall transfer level: Needs assistance Equipment used: 1 person hand held assist Transfers: Sit to/from Stand Sit to Stand: Min assist Stand pivot transfers: Min assist            Balance Overall balance assessment: Needs assistance Sitting-balance support: No upper extremity supported;Feet supported Sitting balance-Leahy Scale: Good     Standing balance support: Single extremity supported Standing balance-Leahy Scale: Poor                             ADL either performed or assessed with clinical judgement   ADL Overall ADL's : Needs assistance/impaired                 Upper Body Dressing : Maximal  assistance;Sitting   Lower Body Dressing: Maximal assistance;Sit to/from stand   Toilet Transfer: Minimal assistance;Stand-pivot;BSC   Toileting- Clothing Manipulation and Hygiene: Moderate assistance;Sit to/from stand         General ADL Comments: daugther present. Education provided regarding ADL activity and sling.  Education regarding LUE HEP provided.  Pt able to demonstrate for daugther exercises as instructed.  Upon taking off sling OT called RN due to foul smell. Pt with significant amount of yeast under breasts and in elbow area.     Vision Patient Visual Report: No change from baseline            Cognition Arousal/Alertness: Awake/alert Behavior During Therapy: WFL for tasks assessed/performed Overall Cognitive Status: Within Functional Limits for tasks assessed                                          Exercises Shoulder Exercises Elbow Flexion: AROM;AAROM;Left(able to tolerate x2) Elbow Extension: AROM;AAROM(able to tolerate x2) Wrist Flexion: AROM;10 reps Wrist Extension: AROM;10 reps Digit Composite Flexion: AROM;10 reps Composite Extension: AROM;10 reps   Shoulder Instructions Shoulder Instructions Donning/doffing shirt without moving shoulder: Minimal assistance;Patient able to independently direct caregiver Method for sponge bathing under operated UE: Minimal assistance;Patient able to independently direct caregiver Donning/doffing sling/immobilizer: Minimal assistance;Patient able to independently  direct caregiver Correct positioning of sling/immobilizer: Minimal assistance;Patient able to independently direct caregiver ROM for elbow, wrist and digits of operated UE: Minimal assistance;Patient able to independently direct caregiver Sling wearing schedule (on at all times/off for ADL's): Modified independent Positioning of UE while sleeping: Minimal assistance;Patient able to independently direct caregiver     General Comments       Pertinent Vitals/ Pain       Pain Score: 2  Pain Location: back and L shoulder Pain Descriptors / Indicators: Sore Pain Intervention(s): Limited activity within patient's tolerance;Monitored during session     Prior Functioning/Environment              Frequency  Min 2X/week        Progress Toward Goals  OT Goals(current goals can now be found in the care plan section)  Progress towards OT goals: Progressing toward goals     Plan Discharge plan needs to be updated(pt plans to go home with daughter)       AM-PAC OT "6 Clicks" Daily Activity     Outcome Measure   Help from another person eating meals?: A Little Help from another person taking care of personal grooming?: A Little Help from another person toileting, which includes using toliet, bedpan, or urinal?: A Lot Help from another person bathing (including washing, rinsing, drying)?: A Lot Help from another person to put on and taking off regular upper body clothing?: A Lot Help from another person to put on and taking off regular lower body clothing?: A Lot 6 Click Score: 14    End of Session    OT Visit Diagnosis: Other abnormalities of gait and mobility (R26.89);Muscle weakness (generalized) (M62.81);Unsteadiness on feet (R26.81);Pain Pain - Right/Left: Left Pain - part of body: Shoulder   Activity Tolerance Patient limited by fatigue   Patient Left in bed;with call bell/phone within reach;with nursing/sitter in room;with family/visitor present   Nurse Communication Mobility status;Other (comment)(yeast under breasts, anterior elbow area and under arm)        Time: 0140-0207 OT Time Calculation (min): 27 min  Charges: OT General Charges $OT Visit: 1 Visit OT Treatments $Self Care/Home Management : 23-37 mins  Kari Baars, [place] [phone] Office- [place], [place] 01/24/2019, 5:22 PM

## 2019-01-24 NOTE — Progress Notes (Signed)
Orders written to remove PICC. Dressing removed. Patient instructed to take deep breath/valsalva maneuver during withdrawal. Insertion site covered with vasoline gauze and pressure dressing. Pressure held for approximately 3-5 minutes, during which signs and symptoms of infection and site care instructions given to patient and daughter at bedside. Assigned nurse alerted of PICC removal.

## 2019-01-24 NOTE — Discharge Summary (Addendum)
Physician Discharge Summary  Suzanne [name] [id number] DOB: [birth year] DOA: 12/25/2018  PCP: Patient, No Pcp Per  Admit date: 12/25/2018 Discharge date: 01/24/2019  Admitted From: Home Disposition:  Home  Recommendations for Outpatient Follow-up:  1. Follow up with PCP in 1-2 weeks 2. Follow up with Orthopedic Surgery as scheduled  [place] reviewed. Limited quantity of narcotic will be prescribed for pain mgt for L femur fracture  Home Health:PT, RN, Aide, SW  Equipment/Devices:4 prong cane    Discharge Condition:Improved CODE STATUS:Full Diet recommendation: heart healthy   Brief/Interim Summary: 58 year old with past medical history significant for stage IV breast cancer with known metastasis to bone, lung and lymph nodes and malignant pleural effusion admitted on 12/25/2018 with worsening shortness of breath and altered mental status. Of note patient was diagnosed with metastatic breast cancer in early June 2020. At the time of diagnosis she was found to have widespread metastatic disease and pathologic fracture in her right right clavicle. She subsequently underwent neoadjuvant chemotherapy but has also had her treatment complicated by recurrent malignant right-sided pleural effusion, neutropenia and thrombocytopenia.   In the emergency room patient was noted to be septic, likely secondary to sacral decubital ulcer, blood cultures on admission grew MSSA. ID was consulted and recommendation was for 4 weeks of Ancef, which finish on 8/27. Patient was also found to have a pathologic left humeral fracture. Examination by orthopedic surgery recommended surgery this admission.   Patient underwentleft Humerus fracture open reduction and internal fixation using Bionmet ALPS plate on8/14.Post surgery patient develop acute respiratory distress and she was reintubated. Patient was subsequently extubated on 8/15, she was placed on BiPAP. Her respiratory status has been stable, on  BiPAP at bedtime. Respiratory failure was thought to be related to pulmonary edema and pleural effusion and chronic elevation of the right hemi-diaphragma.she was a started on IV Lasix.  Discharge Diagnoses:  Principal Problem:   Malignant pleural effusion Active Problems:   Bone metastasis (HCC)   Pleural effusion, bilateral   Carcinoma of breast, estrogen receptor positive, stage 4, right (HCC)   Acute on chronic respiratory failure with hypoxia (HCC)   Pressure injury of skin   Pneumonia   Acute respiratory disorder in immunocompromised patient ([place])   Fracture closed, humerus, shaft   Acute on chronic diastolic CHF (congestive heart failure) (HCC)   Bacteremia   FTT (failure to thrive) in adult   Acute pharyngitis  Acute on chronic hypoxic respiratory failure, likely Multifactorial including malignant pleural effusionand a/c diast CHF -Patient presented with worsening hypoxia and admitted on 12/25/18. cxr with known bilateral pleural effusions on presentation. cta on presentation was negative for PE -She has a known right malignant pleural effusion. Last thoracentesis was 6/20. Per chart review, there was some concern of possible lymphangitic spread of tumor in the lungs. -She was initially on IV Solu-Medrol which has been discontinued. -Patient had to be re-intubated post shoulder surgery 8/14, extubated on 8/15 - repeat cxr on 8/22showed recurrent right pleural effusion, IR consulted for right sided thoracentesis >pt went to IR8/25 where [place] only showed minimal fluid and they tried to aspirate some but the procedure was difficult because of her L arm surg she couldn't lie on the L side , so they had to abort. Not sure yet if any fluid was sent off >From this we must assume that the RLL xray changes are related to [facility] +/- atelectasis. No septations were seen by [place], reviewed -Pt completed course of abx 8/27. Plan to d/c  home with home health   Stage IV  metastatic breast cancer to bone and lung: - Oncology Dr Jana Hakim follow up appreciated.. - anastrozole, palbociclib and denosumab/xgeva per oncology - Remains stable at present  Acute on chronic diastolic chf:- -currently on BIPAP at HS and PRN during day, nasal cannula day time -Continue with incentive spirometry, out of bed. - no sig vol overload on exam at this point -resume home lasix on d/c  MSSA bacteremia/sepsis; -Sepsis present on admission. Likely source a stage II/III sacral decubitus ulcer. -ID consultation was obtained. -Sacral wound did not require any surgical intervention.. -TTE did not show any signs of endocarditis.. -Repeated blood cultures on 7/30 first did not show any growth -Per ID recommendation, patient is currently on 4 weeks course of Ancefuntil 8/27 -single lumen PICC line to right arm placed on 8/4. 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 -Seen by radiation oncology.  -Seen by Dr. Veverly Fells with orthopedic who recommended surgery this admission. -Dr. [place] Pink discussed case with Dr. Jana Hakim and orthopedic doctor and decision was made to proceed with surgery this admission >> so underwentleft Humerus fracture open reduction and internal fixation using Bionmet ALPS plate on [id number] Has sling to arm, follow with ortho Dr Veverly Fells in two weeks,  Elevated d-dimer: -likely from active cancer, CT angio chest was negative for  PE, Dopplers of lower extremity negative for DVT. -Stable at this time  Elevated alkaline phosphatase: -Secondary to metastatic bone disease. -Currently stable  HTN: -BP stable -Cont currently coreg, lasix  Stage 2-3 decubitus ulcer sacrum present on admission Local care.  Pressure Injury 12/25/18 Sacrum Mid Deep Tissue Injury - Purple or maroon localized area of discolored intact skin or blood-filled blister due to damage of underlying soft tissue from pressure and/or shear. evolving into stage 2 in patchy areas when asse (Active)  12/25/18 1700  Location: Sacrum  Location Orientation: Mid  Staging: Deep Tissue Injury - Purple or maroon localized area of discolored intact skin or blood-filled blister due to damage of underlying soft tissue from pressure and/or shear.  Wound Description (Comments): evolving into stage 2 in patchy areas when assessed on 8/2  Present on Admission: Yes    Nutrition Problem: Increased nutrient needs Etiology: chronic illness, cancer and cancer related treatments   FTT:  -she is declined by CIR,snf placement recommended, no bed,  -Plan to d/c to day with home health    Discharge Instructions   Allergies as of 01/24/2019   No Known Allergies     Medication List    STOP taking these medications   predniSONE 5 MG tablet Commonly known as: DELTASONE     TAKE these medications   albuterol 108 (90 Base) MCG/ACT inhaler Commonly known as: VENTOLIN HFA Inhale 2 puffs into the lungs every 6 (six) hours as needed for wheezing or shortness of breath.   ALPRAZolam 0.25 MG tablet Commonly known as: XANAX Take 1-2 tablets (0.25-0.5 mg total) by mouth every 8 (eight) hours as needed for anxiety.  anastrozole 1 MG tablet Commonly known as: ARIMIDEX Take 1 tablet (1 mg total) by mouth daily.   antiseptic oral rinse Liqd 15 mLs by Mouth Rinse route as needed for dry mouth.   benzonatate 200 MG capsule Commonly known as:  TESSALON TAKE 1 CAPSULE BY MOUTH 3 TIMES DAILY IF NEEDED What changed: See the new instructions.   calcium-vitamin D 500-200 MG-UNIT tablet Commonly known as: OSCAL WITH D Take 1 tablet by mouth 3 (three) times daily.   carvedilol 3.125 MG tablet Commonly known as: COREG Take 1 tablet (3.125 mg total) by mouth 2 (two) times daily with a meal. What changed:   medication strength  how much to take   furosemide 20 MG tablet Commonly known as: LASIX Take 2 tablets (40 mg total) by mouth 2 (two) times daily.   loratadine 10 MG tablet Commonly known as: CLARITIN Take 1 tablet (10 mg total) by mouth daily.   nystatin powder Commonly known as: MYCOSTATIN/NYSTOP Apply topically 3 (three) times daily. Apply under breast area   oxyCODONE 5 MG immediate release tablet Commonly known as: Oxy IR/ROXICODONE Take 0.5 tablets (2.5 mg total) by mouth every 4 (four) hours as needed for moderate pain or severe pain.   palbociclib 100 MG capsule Commonly known as: IBRANCE Take 1 capsule (100 mg total) by mouth daily with breakfast. Take whole with food. Take for 21 days on, 7 days off, repeat every 28 days.   polyethylene glycol 17 g packet Commonly known as: MIRALAX / GLYCOLAX Take 17 g by mouth as needed for constipation.            Durable Medical Equipment  (From admission, onward)         Start     Ordered   01/24/19 1131  For home use only DME Cane  Once    Comments: Quad cane   01/24/19 1130   01/22/19 1540  For home use only DME standard manual wheelchair with seat cushion  Once    Comments: Patient suffers from cancer which impairs their ability to perform daily activities like walking in the home.  A walker will not resolve issue with performing activities of daily living. A heavy duty wheelchair will allow patient to safely perform daily activities. Patient can safely propel the heavy duty wheelchair in the home or has a caregiver who can provide assistance. Length of  need lifetime. Accessories: elevating leg rests (ELRs), wheel locks, extensions and anti-tippers.  Heavy Duty Wheelchair   01/22/19 1541         Follow-up Information    Netta Cedars, MD. Call in 2 weeks.   Specialty: Orthopedic Surgery Why: [phone] Contact information: [address] [place] [address] [phone]          No Known Allergies  Consultations:  Oncology  Critical care  Ortho  Infectious disease  Procedures/Studies: Dg Chest 1 View  Result Date: 01/20/2019 CLINICAL DATA:  Status post right thoracentesis. History of metastatic breast cancer EXAM: CHEST  1 VIEW COMPARISON:  01/17/2019 FINDINGS: Stable cardiomediastinal contours. Decrease right-sided pleural effusion. Lung volumes are low. No appreciable pneumothorax. A right-sided PICC line is present. Surgical fixation hardware in the proximal left humerus. Extensive osseous metastatic disease throughout the visualized thorax. IMPRESSION: Interval decrease in a right-sided pleural effusion. No pneumothorax identified. Electronically Signed   By: Davina Poke M.D.   On: 01/20/2019 15:54   Dg Chest Port 1 View  Result Date: 01/17/2019 CLINICAL DATA:  Shortness of breath, cough, metastatic breast cancer, malignant pleural effusion EXAM: PORTABLE CHEST 1 VIEW COMPARISON:  01/13/2019 FINDINGS: No significant change in AP portable examination with large right, moderate left pleural effusions and associated atelectasis or consolidation. No new airspace opacity. The heart and mediastinum are predominantly obscured. Extensive sclerotic osseous metastatic disease with plate and screw fixation of the proximal left humerus. Right upper extremity PICC. IMPRESSION: No significant change in AP portable examination with large right, moderate left pleural effusions and associated atelectasis or consolidation. No new airspace opacity. The heart and mediastinum are predominantly obscured. Extensive  sclerotic osseous metastatic disease with plate and screw fixation of the proximal left humerus. Right upper extremity PICC. Electronically Signed   By: Eddie Candle M.D.   On: 01/17/2019 14:42   Dg Chest Port 1 View  Result Date: 01/13/2019 CLINICAL DATA:  Aspiration into respiratory tract. Acute on chronic respiratory failure. EXAM: PORTABLE CHEST 1 VIEW COMPARISON:  Multiple prior exams most recently earlier this day. FINDINGS: Patient's chin obscures the apices. Right upper extremity PICC with tip unchanged. Progressive hazy opacity at the lung bases likely pleural effusions. Increasing vascular congestion. Grossly unchanged heart size and mediastinal contours. No pneumothorax. Osseous metastatic disease with recent surgical fixation of the left proximal humerus. IMPRESSION: Progressive hazy opacity at the lung bases likely pleural effusions. Increasing vascular congestion. Electronically Signed   By: Keith Rake M.D.   On: 01/13/2019 23:11   Dg Chest Port 1 View  Result Date: 01/13/2019 CLINICAL DATA:  Atelectasis EXAM: PORTABLE CHEST 1 VIEW COMPARISON:  01/10/2019 FINDINGS: Cardiac shadow is stable. Endotracheal tube is been removed as has a gastric catheter. Right-sided PICC line remains in satisfactory position. Elevation of the right hemidiaphragm is again seen and stable. Bibasilar atelectatic changes are noted increased from the prior study. Postsurgical changes in the left shoulder are noted. IMPRESSION: Interval removal of tubes and lines. Slight increase in bibasilar atelectasis. Electronically Signed   By: Inez Catalina M.D.   On: 01/13/2019 07:20   Dg Chest Port 1 View  Result Date: 01/10/2019 CLINICAL DATA:  Intubation and OG tube placement. EXAM: PORTABLE CHEST 1 VIEW COMPARISON:  Radiograph yesterday. FINDINGS: Endotracheal tube tip 2.4 cm from the carina. Enteric tube tip below the diaphragm not included in the field of view. Tip of the right upper extremity PICC in the upper SVC.  Low lung volumes persist with slight decrease patchy opacities from prior exam. Persistent blunting of both costophrenic angles. Unchanged heart size and mediastinal contours. Bony metastatic disease again seen. Recent left humeral fixation is partially included. IMPRESSION: 1. Endotracheal tube tip 2.4 cm from the carina. Enteric tube tip below the diaphragm not included in the field of view. 2. Persistent low lung volumes. Slight improvement in bilateral patchy opacities from prior exam. Electronically Signed   By: Keith Rake M.D.   On: 01/10/2019 01:06   Dg Chest Port 1 View  Result Date: 01/09/2019 CLINICAL DATA:  Shortness of breath. EXAM: PORTABLE CHEST 1 VIEW COMPARISON:  Radiograph 01/05/2019, chest CT 12/25/2018 FINDINGS: Very low lung volumes limit assessment, lower lung volumes from prior exam. Right upper extremity PICC tip in the SVC. Cardiomediastinal contours are grossly stable, not well visualized due to low lung volumes. Progression in bilateral lung opacities, right perihilar and left lung base. Blunting of the costophrenic angles likely effusions. Osseous metastatic disease again seen. IMPRESSION: 1. Very low lung volumes, limited assessment lower lung volumes from prior exam. 2. Progression in bilateral lung opacities,  right perihilar and left lung base, may be combination of pulmonary edema and pneumonia. Suspected pleural effusions. Electronically Signed   By: Keith Rake M.D.   On: 01/09/2019 22:14   Dg Chest Port 1 View  Result Date: 01/05/2019 CLINICAL DATA:  Follow-up pleural effusion EXAM: PORTABLE CHEST 1 VIEW COMPARISON:  12/25/2018 FINDINGS: Cardiac shadow is stable. Elevation of the right hemidiaphragm is noted. Patchy changes are seen bilaterally but improved from the prior exam. A new right-sided PICC line is noted at the cavoatrial junction. Osseous metastatic disease is noted with changes in the proximal left humerus suspicious for fracture. IMPRESSION: Stable  osseous metastatic disease. New right-sided PICC line with the tip at the cavoatrial junction. Patchy basilar infiltrates mildly improved from the prior study. Electronically Signed   By: Inez Catalina M.D.   On: 01/05/2019 16:27   Dg Shoulder Left Port  Result Date: 01/09/2019 CLINICAL DATA:  Postop EXAM: LEFT SHOULDER - 1 VIEW COMPARISON:  01/05/2019 FINDINGS: Interval surgical plate and multiple screw fixation of the left humerus for pathologic proximal humerus fracture. Near anatomic alignment. No humeral head dislocation. Numerous lytic and sclerotic lesions consistent with metastatic disease. Amorphous opacities the proximal humerus at the level of the fracture, presumed to represent surgical packing material. IMPRESSION: Interval surgical plate and multiple screw fixation of proximal left humerus fracture with expected operative changes. Numerous lytic and sclerotic osseous lesions, consistent with skeletal metastatic disease Electronically Signed   By: Donavan Foil M.D.   On: 01/09/2019 21:13   Dg Abd Portable 1v  Result Date: 01/10/2019 CLINICAL DATA:  OG tube placement. EXAM: PORTABLE ABDOMEN - 1 VIEW COMPARISON:  None. FINDINGS: Tip and side port of the enteric tube below the diaphragm in the stomach. Relative paucity of upper abdominal bowel gas. No evidence of free air or obstruction. IMPRESSION: Tip and side port of the enteric tube below the diaphragm in the stomach. Electronically Signed   By: Keith Rake M.D.   On: 01/10/2019 01:07   Dg Humerus Left  Result Date: 01/05/2019 CLINICAL DATA:  Known left humeral fracture, continued pain EXAM: LEFT HUMERUS - 2+ VIEW COMPARISON:  Radiograph 12/20/2018 FINDINGS: Redemonstration of the diffusely mottled sclerotic and lytic appearance of the bones compatible with widespread osseous metastatic disease. Redemonstration of the pathologic fracture through the large lytic lesion in the proximal left humeral diaphysis. There is similar to slightly  increased valgus angulation across the fracture. No new fracture is evident. Diffuse soft tissue swelling is present. Multiple pathologic left rib fractures are again seen within the included portion of the chest wall. Coarse interstitial changes present in the left lung are similar to prior with small left effusion. IMPRESSION: Redemonstration of widespread osseous metastatic disease Pathologic fracture of the proximal left humerus with similar to slightly increased valgus angulation Multiple pathologic left rib fractures. Diffuse interstitial changes and patchy consolidation in the lungs. Suspect trace left effusion. Electronically Signed   By: Lovena Le M.D.   On: 01/05/2019 19:06   Vas [place] Lower Extremity Venous (dvt)  Result Date: 12/26/2018  Lower Venous Study Indications: Edema.  Comparison Study: previous study done 11/17/18 Performing Technologist: Abram Sander RVS  Examination Guidelines: A complete evaluation includes B-mode imaging, spectral Doppler, color Doppler, and power Doppler as needed of all accessible portions of each vessel. Bilateral testing is considered an integral part of a complete examination. Limited examinations for reoccurring indications may be performed as noted.  +---------+---------------+---------+-----------+----------+--------------+ RIGHT    CompressibilityPhasicitySpontaneityPropertiesSummary        +---------+---------------+---------+-----------+----------+--------------+  CFV      Full           Yes      Yes                                 +---------+---------------+---------+-----------+----------+--------------+ SFJ      Full                                                        +---------+---------------+---------+-----------+----------+--------------+ FV Prox  Full                                                        +---------+---------------+---------+-----------+----------+--------------+ FV Mid   Full                                                         +---------+---------------+---------+-----------+----------+--------------+ FV DistalFull                                                        +---------+---------------+---------+-----------+----------+--------------+ PFV      Full                                                        +---------+---------------+---------+-----------+----------+--------------+ POP      Full           Yes      Yes                                 +---------+---------------+---------+-----------+----------+--------------+ PTV      Full                                                        +---------+---------------+---------+-----------+----------+--------------+ PERO                                                  Not visualized +---------+---------------+---------+-----------+----------+--------------+   +---------+---------------+---------+-----------+----------+--------------+ LEFT     CompressibilityPhasicitySpontaneityPropertiesSummary        +---------+---------------+---------+-----------+----------+--------------+ CFV      Full           Yes      Yes                                 +---------+---------------+---------+-----------+----------+--------------+  SFJ      Full                                                        +---------+---------------+---------+-----------+----------+--------------+ FV Prox  Full                                                        +---------+---------------+---------+-----------+----------+--------------+ FV Mid   Full                                                        +---------+---------------+---------+-----------+----------+--------------+ FV DistalFull                                                        +---------+---------------+---------+-----------+----------+--------------+ PFV      Full                                                         +---------+---------------+---------+-----------+----------+--------------+ POP      Full           Yes      Yes                                 +---------+---------------+---------+-----------+----------+--------------+ PTV      Full                                                        +---------+---------------+---------+-----------+----------+--------------+ PERO                                                  Not visualized +---------+---------------+---------+-----------+----------+--------------+     Summary: Right: There is no evidence of deep vein thrombosis in the lower extremity. Left: No evidence of deep vein thrombosis in the lower extremity.  *See table(s) above for measurements and observations. Electronically signed by Curt Jews MD on 12/26/2018 at 1:51:48 PM.    Final    [place] Ekg Site Rite  Result Date: 12/30/2018 If Site Rite image not attached, placement could not be confirmed due to current cardiac rhythm.  US Thoracentesis Asp Pleural Space W/img Guide  Result Date: 01/20/2019 INDICATION: Patient with history of metastatic breast cancer, recurrent malignant right pleural effusion. Request to IR for diagnostic and therapeutic right sided thoracentesis today. EXAM: ULTRASOUND GUIDED RIGHT THORACENTESIS MEDICATIONS:  10 mL 1% lidocaine COMPLICATIONS: None immediate. PROCEDURE: An ultrasound guided thoracentesis was thoroughly discussed with the patient and questions answered. The benefits, risks, alternatives and complications were also discussed. The patient understands and wishes to proceed with the procedure. Written consent was obtained. Ultrasound was performed to localize and mark an adequate pocket of fluid in the right chest. The area was then prepped and draped in the normal sterile fashion. 1% Lidocaine was used for local anesthesia. Under ultrasound guidance a 6 Fr Safe-T-Centesis catheter was introduced. No pleural fluid was able to be aspirated. Patient with  significant pain in her back and left shoulder which was present prior to the procedure and was unchanged during the procedure - she is unable to lay on her left side due to recent open reduction internal fixation of the left humerus. Due to significant pain patient requested that procedure be aborted. The catheter was removed and a dressing applied. FINDINGS: No pleural fluid obtained. IMPRESSION: Unsuccessful ultrasound guided right thoracentesis yielding no of pleural fluid. Read by Candiss Norse, PA-C Electronically Signed   By: Jacqulynn Cadet M.D.   On: 01/20/2019 15:54    Subjective: Eager to go home  Discharge Exam: Vitals:   01/24/19 0515 01/24/19 1128  BP: 130/61   Pulse: (!) 103   Resp: 20   Temp: 98.3 F (36.8 C)   SpO2: 98% 96%   Vitals:   01/23/19 2112 01/23/19 2249 01/24/19 0515 01/24/19 1128  BP:   130/61   Pulse:  98 (!) 103   Resp:   20   Temp:  98.6 F (37 C) 98.3 F (36.8 C)   TempSrc:  Oral Oral   SpO2: 95%  98% 96%  Weight:      Height:        General: Pt is alert, awake, not in acute distress Cardiovascular: RRR, S1/S2 +, no rubs, no gallops Respiratory: CTA bilaterally, no wheezing, no rhonchi Abdominal: Soft, NT, ND, bowel sounds + Extremities: no edema, no cyanosis   The results of significant diagnostics from this hospitalization (including imaging, microbiology, ancillary and laboratory) are listed below for reference.     Microbiology: Recent Results (from the past 240 hour(s))  Culture, Urine     Status: Abnormal   Collection Time: 01/15/19 11:50 AM   Specimen: Urine, Random  Result Value Ref Range Status   Specimen Description   Final    URINE, RANDOM Performed at [facility] [address], [place], [place] [address]    Special Requests   Final    NONE Performed at [facility], [place] [address], [place], [place] [address]    Culture 10,000 COLONIES/mL ENTEROBACTER ASBURIAE (A)  Final    Report Status 01/17/2019 FINAL  Final   Organism ID, Bacteria ENTEROBACTER ASBURIAE (A)  Final      Susceptibility   Enterobacter asburiae - MIC*    CEFAZOLIN >=64 RESISTANT Resistant     CEFTRIAXONE >=64 RESISTANT Resistant     CIPROFLOXACIN <=0.25 SENSITIVE Sensitive     GENTAMICIN <=1 SENSITIVE Sensitive     IMIPENEM <=0.25 SENSITIVE Sensitive     NITROFURANTOIN 64 INTERMEDIATE Intermediate     TRIMETH/SULFA <=20 SENSITIVE Sensitive     PIP/TAZO >=128 RESISTANT Resistant     * 10,000 COLONIES/mL ENTEROBACTER ASBURIAE  Novel Coronavirus, NAA (hospital order; send-out to ref lab)     Status: None   Collection Time: 01/18/19  5:40 PM  Result Value Ref Range Status  SARS-CoV-2, NAA NOT DETECTED NOT DETECTED Final    Comment: (NOTE) This test was developed and its performance characteristics determined by [employer]. This test has not been FDA cleared or approved. This test has been authorized by FDA under an Emergency Use Authorization (EUA). This test is only authorized for the duration of time the declaration that circumstances exist justifying the authorization of the emergency use of in vitro diagnostic tests for detection of SARS-CoV-2 virus and/or diagnosis of COVID-19 infection under section 564(b)(1) of the Act, 21 U.S.C. KA:123727), unless the authorization is terminated or revoked sooner. When diagnostic testing is negative, the possibility of a false negative result should be considered in the context of a patient's recent exposures and the presence of clinical signs and symptoms consistent with COVID-19. An individual without symptoms of COVID-19 and who is not shedding SARS-CoV-2 virus would expect to have a negative (not detected) result in this assay. Performed  At: [facility] [place], [place] [id number] Rush Farmer MD [id number]    [place]  Final    Comment: Performed at [facility] [address] [place], [place] [address]     Labs: BNP (last 3 results) Recent Labs    10/26/18 1921 11/14/18 1621 12/25/18 1009  BNP 48.6 37.5 [id number]   Basic Metabolic Panel: Recent Labs  Lab 01/19/19 0431 01/20/19 0418 01/21/19 0356 01/22/19 0444  NA 139 137 139 141  K 3.1* 3.6 3.8 3.8  CL 99 101 104 104  CO2 30 30 29 28   GLUCOSE 112* 117* 125* 112*  BUN 15 12 14 14   CREATININE 0.40* 0.31* 0.42* 0.41*  CALCIUM 7.5* 7.6* 7.9* 8.1*   Liver Function Tests: No results for input(s): AST, ALT, ALKPHOS, BILITOT, PROT, ALBUMIN in the last 168 hours. No results for input(s): LIPASE, AMYLASE in the last 168 hours. No results for input(s): AMMONIA in the last 168 hours. CBC: Recent Labs  Lab 01/19/19 0431 01/22/19 0444  WBC 7.6 7.8  NEUTROABS 4.7  --   HGB 8.1* 8.1*  HCT 27.0* 28.0*  MCV 108.4* 109.8*  PLT 432* 467*   Cardiac Enzymes: No results for input(s): CKTOTAL, CKMB, CKMBINDEX, TROPONINI in the last 168 hours. BNP: Invalid input(s): POCBNP CBG: No results for input(s): GLUCAP in the last 168 hours. D-Dimer No results for input(s): DDIMER in the last 72 hours. Hgb A1c No results for input(s): HGBA1C in the last 72 hours. Lipid Profile No results for input(s): CHOL, HDL, LDLCALC, TRIG, CHOLHDL, LDLDIRECT in the last 72 hours. Thyroid function studies No results for input(s): TSH, T4TOTAL, T3FREE, THYROIDAB in the last 72 hours.  Invalid input(s): FREET3 Anemia work up No results for input(s): VITAMINB12, FOLATE, FERRITIN, TIBC, IRON, RETICCTPCT in the last 72 hours. Urinalysis    Component Value Date/Time   COLORURINE STRAW (A) 01/15/2019 1150   APPEARANCEUR CLEAR 01/15/2019 1150   LABSPEC 1.008 01/15/2019 1150   PHURINE 6.0 01/15/2019 1150   GLUCOSEU NEGATIVE 01/15/2019 1150   HGBUR NEGATIVE 01/15/2019 [place] 01/15/2019 1150   KETONESUR NEGATIVE 01/15/2019 1150   PROTEINUR NEGATIVE 01/15/2019 1150   NITRITE  NEGATIVE 01/15/2019 1150   LEUKOCYTESUR TRACE (A) 01/15/2019 1150   Sepsis Labs Invalid input(s): PROCALCITONIN,  WBC,  LACTICIDVEN Microbiology Recent Results (from the past 240 hour(s))  Culture, Urine     Status: Abnormal   Collection Time: 01/15/19 11:50 AM   Specimen: Urine, Random  Result Value Ref Range Status  Specimen Description   Final    URINE, RANDOM Performed at [facility], [place] [address], [place], [place] [address]    Special Requests   Final    NONE Performed at [facility], [place] [address], [place], [place] [address]    Culture 10,000 COLONIES/mL ENTEROBACTER ASBURIAE (A)  Final   Report Status 01/17/2019 FINAL  Final   Organism ID, Bacteria ENTEROBACTER ASBURIAE (A)  Final      Susceptibility   Enterobacter asburiae - MIC*    CEFAZOLIN >=64 RESISTANT Resistant     CEFTRIAXONE >=64 RESISTANT Resistant     CIPROFLOXACIN <=0.25 SENSITIVE Sensitive     GENTAMICIN <=1 SENSITIVE Sensitive     IMIPENEM <=0.25 SENSITIVE Sensitive     NITROFURANTOIN 64 INTERMEDIATE Intermediate     TRIMETH/SULFA <=20 SENSITIVE Sensitive     PIP/TAZO >=128 RESISTANT Resistant     * 10,000 COLONIES/mL ENTEROBACTER ASBURIAE  Novel Coronavirus, NAA (hospital order; send-out to ref lab)     Status: None   Collection Time: 01/18/19  5:40 PM  Result Value Ref Range Status   SARS-CoV-2, NAA NOT DETECTED NOT DETECTED Final    Comment: (NOTE) This test was developed and its performance characteristics determined by [employer]. This test has not been FDA cleared or approved. This test has been authorized by FDA under an Emergency Use Authorization (EUA). This test is only authorized for the duration of time the declaration that circumstances exist justifying the authorization of the emergency use of in vitro diagnostic tests for detection of SARS-CoV-2 virus and/or diagnosis of COVID-19 infection under section 564(b)(1) of the Act, 21  U.S.C. KA:123727), unless the authorization is terminated or revoked sooner. When diagnostic testing is negative, the possibility of a false negative result should be considered in the context of a patient's recent exposures and the presence of clinical signs and symptoms consistent with COVID-19. An individual without symptoms of COVID-19 and who is not shedding SARS-CoV-2 virus would expect to have a negative (not detected) result in this assay. Performed  At: [facility] [place], [place] [id number] Rush Farmer MD [id number]    [place]  Final    Comment: Performed at [facility] [address] [place], [place] [address]   Time spent: 30 min  SIGNED:   Marylu Lund, MD  [facility] 01/24/2019, 2:15 PM  If 7PM-7AM, please contact night-coverage

## 2019-01-26 ENCOUNTER — Telehealth: Payer: Self-pay | Admitting: *Deleted

## 2019-01-26 ENCOUNTER — Telehealth: Payer: Self-pay | Admitting: Oncology

## 2019-01-26 NOTE — Telephone Encounter (Signed)
This RN spoke with pt's daughter, [name], per her questions regarding follow up in this office since pt was discharged over the weekend.  [name] states her mom completed antibiotic therapy upon discharge and has started the [place].  Overall things are well in the home - they are awaiting arrival of wheelchair.  " but mom and I both got good sleep last night ".  This RN sent request per MD for follow up this week with mid level and noted need for arranging of transportation.  Received communication from scheduling that per transportation coordinator " spoke with daughter and she said her mom is on a wheel chair so they will hvet to find another form of transportation ".  This note will be forwarded to Social Work for possible transportation coordination.  This note will also be sent to MD for review of communication.

## 2019-01-26 NOTE — Telephone Encounter (Signed)
Confirmed with dtr Gerlean Ren 9/3 lab/fu. Message sent to transportation.

## 2019-01-27 ENCOUNTER — Ambulatory Visit: Payer: Self-pay | Admitting: Licensed Clinical Social Worker

## 2019-01-27 ENCOUNTER — Telehealth: Payer: Self-pay | Admitting: Licensed Clinical Social Worker

## 2019-01-27 ENCOUNTER — Encounter: Payer: Self-pay | Admitting: *Deleted

## 2019-01-27 ENCOUNTER — Encounter: Payer: Self-pay | Admitting: Licensed Clinical Social Worker

## 2019-01-27 DIAGNOSIS — Z1379 Encounter for other screening for genetic and chromosomal anomalies: Secondary | ICD-10-CM | POA: Insufficient documentation

## 2019-01-27 DIAGNOSIS — Z8042 Family history of malignant neoplasm of prostate: Secondary | ICD-10-CM

## 2019-01-27 DIAGNOSIS — Z800 Family history of malignant neoplasm of digestive organs: Secondary | ICD-10-CM

## 2019-01-27 DIAGNOSIS — Z803 Family history of malignant neoplasm of breast: Secondary | ICD-10-CM

## 2019-01-27 DIAGNOSIS — Z808 Family history of malignant neoplasm of other organs or systems: Secondary | ICD-10-CM

## 2019-01-27 NOTE — Progress Notes (Addendum)
Genetic Test Results  HPI:  Suzanne [name] was previously seen in the [facility] clinic due to a personal and family history of cancer and concerns regarding a hereditary predisposition to cancer. Please refer to our prior cancer genetics clinic note for more information regarding our discussion, assessment and recommendations, at the time. Suzanne [name] recent genetic test results were disclosed to her, as were recommendations warranted by these results. These results and recommendations are discussed in more detail below.  CANCER HISTORY:  Oncology History   No history exists.    FAMILY HISTORY:  We obtained a detailed, 4-generation family history.  Significant diagnoses are listed below: Family History  Problem Relation Age of Onset  . Breast cancer Sister        in her 6's-70s  . Heart disease Brother        CABG  . Heart disease Brother        CABG  . Skin cancer Brother        melanoma  . Colon cancer Cousin   . Heart attack Mother   . Heart attack Father   . Prostate cancer Brother     Suzanne [name] has one daughter, Suzanne [name], 75, who was present during the session today. The patient has 6 brothers and 1 sister. One of her brothers was diagnosed with prostate cancer at 11 and is currently undergoing treatment, it is not metastatic. Another brother had a melanoma removed at 5. Her sister was diagnosed with breast cancer in around age 48.   Suzanne [name]'s mother died at 74. Patient had 2 maternal uncles, 2 maternal aunts, no known cancers. No cancers in maternal cousins. She does not have information about maternal grandparents.  Suzanne [name]'s father died at 66. Patient had 1 paternal uncle, 2 paternal aunts, no cancers. One of her paternal cousins had colon cancer, unsure of age of diagnosis. She does not have information about her paternal grandparents.   Suzanne [name] is unaware of previous family history of genetic testing for hereditary cancer risks. Patient's  maternal ancestors are of [place] descent, and paternal ancestors are of [place] and [place] descent. There is no reported Ashkenazi Jewish ancestry. There is no known consanguinity.  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  The Common Hereditary Cancers Panel offered by [facility] includes sequencing and/or deletion duplication testing of the following 48 genes: APC, ATM, AXIN2, BARD1, BMPR1A, BRCA1, BRCA2, BRIP1, CDH1, CDKN2A (p14ARF), CDKN2A (p16INK4a), CKD4, CHEK2, CTNNA1, DICER1, EPCAM (Deletion/duplication testing only), GREM1 (promoter region deletion/duplication testing only), KIT, MEN1, MLH1, MSH2, MSH3, MSH6, MUTYH, NBN, NF1, NHTL1, PALB2, PDGFRA, PMS2, POLD1, POLE, PTEN, RAD50, RAD51C, RAD51D, RNF43, SDHB, SDHC, SDHD, SMAD4, SMARCA4. STK11, TP53, TSC1, TSC2, and VHL.  The following genes were evaluated for sequence changes only: SDHA and HOXB13 c.251G>A variant only. The [facility] Melanoma Panel analyzed the following 9 genes: BAP1 BRCA2 CDK4 CDKN2A MITF POT1 PTEN RB1 Tp53. The test report has been scanned into EPIC and is located under the Molecular Pathology section of the Results Review tab.  A portion of the result report is included below for reference.    We discussed with Suzanne [name]  That this result does not explain her personal history of breast cancer. We also discussed that because current genetic testing is not perfect, it is possible there may be a gene mutation  in one of these genes that current testing cannot detect, but that chance is small.  We also discussed, that there could be another gene that has not yet been discovered, or that we have not yet tested, that is responsible for the cancer diagnoses in the family. It is also possible there is a hereditary cause for the cancer in the family that Suzanne [name]  did not inherit and therefore was not identified in her testing.  Therefore, it is important to remain in touch with cancer genetics in the future so that we can continue to offer Suzanne [name] the most up to date genetic testing.   HOXB13 Numerous studies have shown that the c.251G>A (p.Gly84Glu) variant in HOXB13, also known as G84E, is associated with an increased risk of prostate cancer (PMID: 54008676, 19509326, 71245809, 98338250, 53976734, 19379024, 09735329, 92426834, 19622297, 98921194, 17408144). This variant is associated with earlier-onset diagnosis (<55 years) and individuals with this variant are more likely to have a positive family history of prostate cancer (PMID: 81856314, 97026378, 58850277, 41287867, 67209470, 96283662).  Large case control studies and meta-analysis across studies demonstrated that men with this variant have approximately a 33-60% lifetime risk of prostate cancer compared to the general population risk of 12% (PMID: 94765465, 03546568, 12751700, 17494496, 75916384). Additionally, this variant is present in 0.2% of the general population [employer] ([employer]). Accessed September 2016). The G84E variant is observed at a higher frequency in individuals of European ancestry (0.8%), suggesting it may be a European founder mutation (PMID: 66599357). An individual with this variant will not necessarily develop cancer in his lifetime, but the risk is increased over the general population risk. There may also be an increased risk for other cancer types, however, the current evidence is limited and conflicting (PMID: 01779390, 30092330, 07622633, 35456256, 38937342)  Hereditary predisposition to cancer due to the HOXB13 G84E variant has autosomal dominant inheritance. This means that an individual with this variant has a 50% chance of passing it to their offspring. Once such a variant is detected, it is possible to identify at-risk relatives who can pursue testing. Many  cases are inherited from a parent, but some cases can occur spontaneously (i.e., an individual with a pathogenic variant has parents who do not have it).  The [employer]  (NCCN) currently does not recommend additional prostate cancer screening for individuals with the HOXB13 G84E variant beyond what is recommended for the general population. However, NCCN cautions that cancer screening should ultimately be guided by personal and family history. In the absence of formal guidelines, a formal urology consultation may be considered.  ADDITIONAL GENETIC TESTING: We discussed with Suzanne [name] that her genetic testing was fairly extensive.  If there are genes identified to increase cancer risk that can be analyzed in the future, we would be happy to discuss and coordinate this testing at that time.    CANCER SCREENING RECOMMENDATIONS: Suzanne [name]'s test result is considered positive, however the HOXB13 mutation does not explain her personal history of cancer and some of the family history.  This means that we have not identified a hereditary cause for her personal and some of her family history of cancer at this time. Most cancers happen by chance and this negative test suggests that her cancer may fall into this category.    While reassuring, this does not definitively rule out a hereditary predisposition to cancer. It is still possible that there could be genetic mutations that are undetectable by current technology. There could  be genetic mutations in genes that have not been tested or identified to increase cancer risk.  Therefore, it is recommended she continue to follow the cancer management and screening guidelines provided by her oncology and primary healthcare provider.   An individual's cancer risk and medical management are not determined by genetic test results alone. Overall cancer risk assessment incorporates additional factors, including personal medical history, family  history, and any available genetic information that may result in a personalized plan for cancer prevention and surveillance  RECOMMENDATIONS FOR FAMILY MEMBERS:  Family members should be tested for the HOXB13 mutation identified. This mutation could explain her brother's current prostate cancer. We are available to help coordinate testing for family members.  Additionally, relatives in this family might be at some increased risk of developing cancer, over the general population risk, simply due to the family history of cancer.  We recommended female relatives in this family have a yearly mammogram beginning at age 38, or 90 years younger than the earliest onset of cancer, an annual clinical breast exam, and perform monthly breast self-exams. Female relatives in this family should also have a gynecological exam as recommended by their primary provider. All family members should have a colonoscopy by age 94, or as directed by their physicians.  FOLLOW-UP: Lastly, we discussed with Suzanne [name] that cancer genetics is a rapidly advancing field and it is possible that new genetic tests will be appropriate for her and/or her family members in the future. We will also likely know more about HOXB13 in the future. We encouraged her to remain in contact with cancer genetics on an annual basis so we can update her personal and family histories and let her know of advances in cancer genetics that may benefit this family.   Our contact number was provided. Suzanne [name]'s questions were answered to her satisfaction, and she knows she is welcome to call us at anytime with additional questions or concerns.   Faith Rogue, MS, [facility] Genetic Counselor [email or web address] Phone: [phone]

## 2019-01-27 NOTE — Telephone Encounter (Signed)
Thank you :)

## 2019-01-27 NOTE — Telephone Encounter (Signed)
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 Discussed this gene and that it is associated with a possible increased risk for prostate cancer only, meaning this result does not explain her personal history of breast cancer.  It is unlikely that there is an increased risk of another cancer due to a mutation in one of these genes.  However, genetic testing is not perfect, and cannot definitively rule out a hereditary cause.  It will be important for her to keep in contact with genetics to learn if any additional testing may be needed in the future.

## 2019-01-27 NOTE — Progress Notes (Signed)
[place] Work  [employer] representative contacted patients daughter at home regarding possible transportation concerns.  Patients daughter uses [employer] regularly and stated she had arranged transportation with [employer] for patients appointment tomorrow and plans to call today to schedule patients appointment for 01/29/2019.  Patients daughter also stated that since the ramp has been completed, [employer] will come out to assess for door to door pick up.  Johnnye Lana, MSW, LCSW, OSW-C Clinical Social Worker [facility] [phone]

## 2019-01-28 ENCOUNTER — Encounter: Payer: Self-pay | Admitting: Licensed Clinical Social Worker

## 2019-01-28 ENCOUNTER — Inpatient Hospital Stay: Payer: Self-pay | Admitting: Internal Medicine

## 2019-01-29 ENCOUNTER — Inpatient Hospital Stay: Payer: Medicaid Other | Attending: Adult Health | Admitting: Adult Health

## 2019-01-29 ENCOUNTER — Inpatient Hospital Stay: Payer: Medicaid Other

## 2019-01-29 ENCOUNTER — Inpatient Hospital Stay: Payer: Medicaid Other | Admitting: General Practice

## 2019-01-29 ENCOUNTER — Encounter: Payer: Self-pay | Admitting: Adult Health

## 2019-01-29 ENCOUNTER — Other Ambulatory Visit: Payer: Self-pay

## 2019-01-29 ENCOUNTER — Encounter: Payer: Self-pay | Admitting: General Practice

## 2019-01-29 VITALS — BP 122/78 | HR 112 | Temp 98.3°F | Resp 18

## 2019-01-29 DIAGNOSIS — D649 Anemia, unspecified: Secondary | ICD-10-CM

## 2019-01-29 DIAGNOSIS — J910 Malignant pleural effusion: Secondary | ICD-10-CM | POA: Diagnosis not present

## 2019-01-29 DIAGNOSIS — Z800 Family history of malignant neoplasm of digestive organs: Secondary | ICD-10-CM | POA: Diagnosis not present

## 2019-01-29 DIAGNOSIS — C7951 Secondary malignant neoplasm of bone: Secondary | ICD-10-CM | POA: Diagnosis not present

## 2019-01-29 DIAGNOSIS — Z79811 Long term (current) use of aromatase inhibitors: Secondary | ICD-10-CM | POA: Insufficient documentation

## 2019-01-29 DIAGNOSIS — E669 Obesity, unspecified: Secondary | ICD-10-CM | POA: Insufficient documentation

## 2019-01-29 DIAGNOSIS — C50811 Malignant neoplasm of overlapping sites of right female breast: Secondary | ICD-10-CM

## 2019-01-29 DIAGNOSIS — R0602 Shortness of breath: Secondary | ICD-10-CM

## 2019-01-29 DIAGNOSIS — Z170 Estrogen receptor positive status [ER+]: Secondary | ICD-10-CM

## 2019-01-29 DIAGNOSIS — I890 Lymphedema, not elsewhere classified: Secondary | ICD-10-CM | POA: Diagnosis not present

## 2019-01-29 DIAGNOSIS — Z808 Family history of malignant neoplasm of other organs or systems: Secondary | ICD-10-CM | POA: Insufficient documentation

## 2019-01-29 DIAGNOSIS — Z23 Encounter for immunization: Secondary | ICD-10-CM | POA: Diagnosis not present

## 2019-01-29 DIAGNOSIS — J90 Pleural effusion, not elsewhere classified: Secondary | ICD-10-CM

## 2019-01-29 DIAGNOSIS — C7801 Secondary malignant neoplasm of right lung: Secondary | ICD-10-CM | POA: Diagnosis not present

## 2019-01-29 DIAGNOSIS — Z803 Family history of malignant neoplasm of breast: Secondary | ICD-10-CM | POA: Insufficient documentation

## 2019-01-29 DIAGNOSIS — R0902 Hypoxemia: Secondary | ICD-10-CM | POA: Diagnosis not present

## 2019-01-29 DIAGNOSIS — Z6841 Body Mass Index (BMI) 40.0 and over, adult: Secondary | ICD-10-CM

## 2019-01-29 DIAGNOSIS — C50911 Malignant neoplasm of unspecified site of right female breast: Secondary | ICD-10-CM | POA: Insufficient documentation

## 2019-01-29 LAB — CBC WITH DIFFERENTIAL/PLATELET
Abs Immature Granulocytes: 0.04 10*3/uL (ref 0.00–0.07)
Basophils Absolute: 0.1 10*3/uL (ref 0.0–0.1)
Basophils Relative: 1 %
Eosinophils Absolute: 0.1 10*3/uL (ref 0.0–0.5)
Eosinophils Relative: 1 %
HCT: 32.3 % — ABNORMAL LOW (ref 36.0–46.0)
Hemoglobin: 9.8 g/dL — ABNORMAL LOW (ref 12.0–15.0)
Immature Granulocytes: 0 %
Lymphocytes Relative: 13 %
Lymphs Abs: 1.4 10*3/uL (ref 0.7–4.0)
MCH: 32 pg (ref 26.0–34.0)
MCHC: 30.3 g/dL (ref 30.0–36.0)
MCV: 105.6 fL — ABNORMAL HIGH (ref 80.0–100.0)
Monocytes Absolute: 0.2 10*3/uL (ref 0.1–1.0)
Monocytes Relative: 2 %
Neutro Abs: 8.7 10*3/uL — ABNORMAL HIGH (ref 1.7–7.7)
Neutrophils Relative %: 83 %
Platelets: 509 10*3/uL — ABNORMAL HIGH (ref 150–400)
RBC: 3.06 MIL/uL — ABNORMAL LOW (ref 3.87–5.11)
RDW: 19.4 % — ABNORMAL HIGH (ref 11.5–15.5)
WBC: 10.6 10*3/uL — ABNORMAL HIGH (ref 4.0–10.5)
nRBC: 0 % (ref 0.0–0.2)

## 2019-01-29 LAB — SAMPLE TO BLOOD BANK

## 2019-01-29 LAB — COMPREHENSIVE METABOLIC PANEL
ALT: 7 U/L (ref 0–44)
AST: 14 U/L — ABNORMAL LOW (ref 15–41)
Albumin: 2.8 g/dL — ABNORMAL LOW (ref 3.5–5.0)
Alkaline Phosphatase: 245 U/L — ABNORMAL HIGH (ref 38–126)
Anion gap: 12 (ref 5–15)
BUN: 17 mg/dL (ref 6–20)
CO2: 35 mmol/L — ABNORMAL HIGH (ref 22–32)
Calcium: 9.4 mg/dL (ref 8.9–10.3)
Chloride: 94 mmol/L — ABNORMAL LOW (ref 98–111)
Creatinine, Ser: 0.75 mg/dL (ref 0.44–1.00)
GFR calc Af Amer: >60 mL/min (ref >60)
GFR calc non Af Amer: >60 mL/min (ref >60)
Glucose, Bld: 140 mg/dL — ABNORMAL HIGH (ref 70–99)
Potassium: 3.2 mmol/L — ABNORMAL LOW (ref 3.5–5.1)
Sodium: 141 mmol/L (ref 135–145)
Total Bilirubin: 0.6 mg/dL (ref 0.3–1.2)
Total Protein: 7.4 g/dL (ref 6.5–8.1)

## 2019-01-29 MED ORDER — ALPRAZOLAM 0.25 MG PO TABS: 0.2500 mg | ORAL_TABLET | Freq: Three times a day (TID) | ORAL | 0 refills | Status: DC | PRN

## 2019-01-29 MED ORDER — AZITHROMYCIN 250 MG PO TABS: ORAL_TABLET | ORAL | 0 refills | Status: DC

## 2019-01-29 NOTE — Progress Notes (Signed)
[place] CSW Progress Notes  Met w patient and daughter [name] in lobby prior to lab/provider visit.  Discussed impact of ramp on patient mobility and quality of life. States need to call EMS for transport was highly stressful, ramp is making it possible to leave house for appointments.  Provided one time [employer] $100 gift card for food as well as first of 4 possible distributions of $50 gift card from [facility] for food/supplies.    Daughter asked for help w getting wheelchair.  DME wheelchair was ordered at hospital discharge, but was not provided prior to discharge.  Pt currently in rental wheelchair (contract expires soon).  Daughter has talked w [employer] and [facility] CSW to follow up on wheelchair needed in the home.  Has been told that, post discharge, they will either need to pick up wheelchair from [employer] or pay $75 delivery fee.  CSW contacted [employer] and discussed situation, per rep Santa Genera, she will look into issue and attempt to resolve.  [employer] rep requested information on oxygen saturations - information given to providers so this can be addressed during current visit.  Thompson Caul will be connecting w Ms Candie Mile per provider.  This information is needed order for Medicaid to pay for home O2 for patient.  [name], LCSW Clinical Social Worker Phone:  [phone]

## 2019-01-29 NOTE — Progress Notes (Addendum)
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 She was brought with [facility] transportation.    She was started on Anastrozole in early June, 2020.  She says that she tolerates this well and hasn't noted hot flashes, arthralgias, or vaginal dryness.    Suzanne [name] notes that she is taking Palbociclib.  She says she started it on 8/29.  She receives xgeva every 4 weeks and her last dose was on 7/16.   Suzanne [name] was discharged from the [facility] on 01/24/2019.  PT has not yet been to her home.  She has been weak since her discharge, but nothing worse than during the time she was admitted.  She did wake up last night with increasing back pain, and has been taking her pain medications, Norco.  This has helped with the pain.  She is taking Tessalon Perrles and Phenergan with codeine for her cough.  She has been coughing more since her discharge.  She denies any increased shortness of breath.  REVIEW OF SYSTEMS: Suzanne [name] is tearful today because she is tired of being here and sitting up.  She notes that she doesn't normally sit up and it is hard to sit up for this long.  She denies any fever or chills.  She has had no bowel/bladder changes.  She is without chest pain or palpitations.  A detailed ROS was otherwise non contributory.     HISTORY OF CURRENT ILLNESS: From the original inpatient consult note:  [name] is a 58 year old female from [place], [place] without significant past medical history with exception of obesity, remote history of  gallstones, and remote history of mastitis.  The patient presented to the hospital on 10/26/2018 with a 3-week history of worsening shortness of breath and lower extremity edema.  The patient reported that her shortness of breath worsened with exertion.  Her lower extremity edema did not improve with elevation.  She also had an intermittent pr oductive cough with rare sputum production.  The patient was noted to be anemic with a hemoglobin of 4.5 with a clumped platelets noted on admission.  Stool for occult blood was positive.  Patient received 2 units of packed red blood cells.    The patient reported a rash under her breast x1 week which has been foul-smelling.  She also reported that she has some discomfort and thickening/hardness of her right breast for several months.  She states that her right breast is much larger than her left.  She notes some occasional bloody discharge from her breast once in a while.  A chest x-ray on admission showed diffuse bilateral interstitial opacity of unknown chronicity, findings could be related to interstitial inflammatory process, atypical/viral pneumonia, or possible metastatic disease.  There are also sclerotic and lytic lesions within the clavicles, bilateral ribs, shoulders which was concerning for diffuse few skeletal metastatic disease.    This prompted a CT scan of the chest with contrast which showed bilateral axillary and mediastinal adenopathy, borderline hilar lymph nodes bilaterally, findings likely flecked metastatic lymphadenopathy, moderate bilateral pleural effusions, extensive interstitial thickening and reticulonodular opacities throughout the lungs, cannot exclude lymphangitic  spread of tumor, diffuse skeletal metastases.  The patient's subsequent history is as detailed below.   PAST MEDICAL HISTORY: Past Medical History:  Diagnosis Date  . Cancer ([place])   . Family history of breast cancer   . Family history of colon cancer   . Family history of  melanoma   . Family history of prostate cancer   . Gallstones   . Glaucoma   . Mastitis    reports history of recurrent mastitis  . Metastatic breast cancer ([facility])   . Obesity     PAST SURGICAL HISTORY: Past Surgical History:  Procedure Laterality Date  . GLAUCOMA SURGERY  late 1990's  . IR THORACENTESIS ASP PLEURAL SPACE W/IMG GUIDE  10/28/2018  . IR THORACENTESIS ASP PLEURAL SPACE W/IMG GUIDE  10/29/2018  . OPEN REDUCTION INTERNAL FIXATION (ORIF) DISTAL RADIAL FRACTURE Left 01/09/2019   Procedure: OPEN REDUCTION INTERNAL FIXATION (ORIF) HUMERAL FRACTURE;  Surgeon: Netta Cedars, MD;  Location: [facility];  Service: Orthopedics;  Laterality: Left;    FAMILY HISTORY: Family History  Problem Relation Age of Onset  . Breast cancer Sister        in her 27's-70s  . Heart disease Brother        CABG  . Heart disease Brother        CABG  . Skin cancer Brother        melanoma  . Colon cancer Cousin   . Heart attack Mother   . Heart attack Father   . Prostate cancer Brother   Patient's father passed away at age 63, and her mother at 37, both from heart attacks. The patient has 8 siblings, 6 brothers and 1 sister.  Her sister was diagnosed with breast cancer. One brother has prostate cancer, another brother had a melanoma removed.    GYNECOLOGIC HISTORY:  Menarche: 58 years old Age at first live birth: 58 years old Suzanne [name] P 1 LMP age 12 Contraceptive: used for 2-3 years with no problems HRT never used  Hysterectomy? 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 She is widowed. She lives at home with her daughter. Daughter Suzanne [name], age 41, is a Psychologist, occupational at the science center.  The patient has no grandchildren. She is not a Ambulance person.   ADVANCED DIRECTIVES: not on file; she has the paperwork already. She intends to name her daughter as her HCPOA.   HEALTH MAINTENANCE: Social History   Tobacco Use  . Smoking status: Never Smoker  . Smokeless tobacco:  Never Used  . Tobacco comment: second hand smoke exposure  Substance Use Topics  . Alcohol use: No  . Drug use: No    Colonoscopy:   PAP:   Bone density:  Mammography:   No Known Allergies  Current Outpatient Medications  Medication Sig Dispense Refill  . albuterol (VENTOLIN HFA) 108 (90 Base) MCG/ACT inhaler Inhale 2 puffs into the lungs every 6 (six) hours as needed for wheezing or shortness of breath. 16 g 3  . ALPRAZolam (XANAX) 0.25 MG tablet Take 1-2 tablets (0.25-0.5 mg total) by mouth every 8 (eight) hours as needed for anxiety. 2 tablet 0  . anastrozole (ARIMIDEX) 1 MG tablet Take 1 tablet (1 mg total) by mouth daily. 30 tablet 3  . antiseptic oral rinse (BIOTENE) LIQD 15 mLs by Mouth Rinse route as needed for dry mouth.    . benzonatate (TESSALON) 200 MG capsule TAKE 1 CAPSULE BY MOUTH 3 TIMES DAILY IF NEEDED (Patient taking  differently: Take 200 mg by mouth 3 (three) times daily as needed for cough. ) 30 capsule 0  . calcium-vitamin D (OSCAL WITH D) 500-200 MG-UNIT tablet Take 1 tablet by mouth 3 (three) times daily. 90 tablet 1  . carvedilol (COREG) 3.125 MG tablet Take 1 tablet (3.125 mg total) by mouth 2 (two) times daily with a meal. 60 tablet 0  . furosemide (LASIX) 20 MG tablet Take 2 tablets (40 mg total) by mouth 2 (two) times daily. 120 tablet 4  . loratadine (CLARITIN) 10 MG tablet Take 1 tablet (10 mg total) by mouth daily. 30 tablet 3  . nystatin (MYCOSTATIN/NYSTOP) powder Apply topically 3 (three) times daily. Apply under breast area 15 g 1  . oxyCODONE (OXY IR/ROXICODONE) 5 MG immediate release tablet Take 0.5 tablets (2.5 mg total) by mouth every 4 (four) hours as needed for moderate pain or severe pain. 20 tablet 0  . palbociclib (IBRANCE) 100 MG capsule Take 1 capsule (100 mg total) by mouth daily with breakfast. Take whole with food. Take for 21 days on, 7 days off, repeat every 28 days. 21 capsule 0  . polyethylene glycol (MIRALAX / GLYCOLAX) 17 g packet Take  17 g by mouth as needed for constipation.     No current facility-administered medications for this visit.      OBJECTIVE:   Vitals:   01/29/19 1431  BP: 122/78  Pulse: (!) 112  Resp: 18  Temp: 98.3 F (36.8 C)  SpO2: 99%     There is no height or weight on file to calculate BMI.   Wt Readings from Last 3 Encounters:  01/22/19 182 lb 12.2 oz (82.9 kg)  12/01/18 191 lb 1.6 oz (86.7 kg)  10/30/18 234 lb 2.1 oz (106.2 kg)  ECOG FS:2 Oxygen saturation 92% on room air GENERAL: Patient is morbidly obese, appears tired and chronically ill, wearing oxygen Examined in wheelchair, exam limited due to disability and body habitus HEENT:  Sclerae anicteric.  Oropharynx clear and moist. No ulcerations or evidence of oropharyngeal candidiasis. Neck is supple.  NODES:  No cervical, supraclavicular, or axillary lymphadenopathy palpated.  BREAST EXAM:  Deferred. LUNGS:  Crackles bilaterally, slight tachypnea noted HEART:  Regular rate and rhythm. No murmur appreciated. ABDOMEN:  Soft, nontender.  Positive, normoactive bowel sounds. No organomegaly palpated. MSK:  No focal spinal tenderness to palpation.  EXTREMITIES: scant bilateral lower extremity edema SKIN:  Clear with no obvious rashes or skin changes. No nail dyscrasia. NEURO:  Nonfocal. Well oriented.  Appropriate affect.     LAB RESULTS:  CMP     Component Value Date/Time   NA 141 01/22/2019 0444   K 3.8 01/22/2019 0444   CL 104 01/22/2019 0444   CO2 28 01/22/2019 0444   GLUCOSE 112 (H) 01/22/2019 0444   BUN 14 01/22/2019 0444   CREATININE 0.41 (L) 01/22/2019 0444   CREATININE 0.63 12/22/2018 1219   CALCIUM 8.1 (L) 01/22/2019 0444   PROT 6.5 01/14/2019 0905   ALBUMIN 2.7 (L) 01/14/2019 0905   AST 13 (L) 01/14/2019 0905   AST 61 (H) 12/22/2018 1219   ALT 6 01/14/2019 0905   ALT 51 (H) 12/22/2018 1219   ALKPHOS 201 (H) 01/14/2019 0905   BILITOT 0.6 01/14/2019 0905   BILITOT 0.5 12/22/2018 1219   GFRNONAA >60  01/22/2019 0444   GFRNONAA >60 12/22/2018 1219   GFRAA >60 01/22/2019 0444   GFRAA >60 12/22/2018 1219    No results found for: TOTALPROTELP, ALBUMINELP, A1GS,  A2GS, BETS, BETA2SER, GAMS, MSPIKE, SPEI  No results found for: KPAFRELGTCHN, LAMBDASER, KAPLAMBRATIO  Lab Results  Component Value Date   WBC 10.6 (H) 01/29/2019   NEUTROABS 8.7 (H) 01/29/2019   HGB 9.8 (L) 01/29/2019   HCT 32.3 (L) 01/29/2019   MCV 105.6 (H) 01/29/2019   PLT 509 (H) 01/29/2019    '@LASTCHEMISTRY' @  No results found for: LABCA2  No components found for: FYBOFB510  No results for input(s): INR in the last 168 hours.  No results found for: LABCA2  No results found for: CHE527  No results found for: POE423  No results found for: NTI144  Lab Results  Component Value Date   CA2729 163.4 (H) 12/22/2018    No components found for: HGQUANT  No results found for: CEA1 / No results found for: CEA1   No results found for: AFPTUMOR  No results found for: [place]  No results found for: PSA1  Appointment on 01/29/2019  Component Date Value Ref Range Status  . WBC 01/29/2019 10.6* 4.0 - 10.5 K/uL Final  . RBC 01/29/2019 3.06* 3.87 - 5.11 MIL/uL Final  . Hemoglobin 01/29/2019 9.8* 12.0 - 15.0 g/dL Final  . HCT 01/29/2019 32.3* 36.0 - 46.0 % Final  . MCV 01/29/2019 105.6* 80.0 - 100.0 fL Final  . MCH 01/29/2019 32.0  26.0 - 34.0 pg Final  . MCHC 01/29/2019 30.3  30.0 - 36.0 g/dL Final  . RDW 01/29/2019 19.4* 11.5 - 15.5 % Final  . Platelets 01/29/2019 509* 150 - 400 K/uL Final  . nRBC 01/29/2019 0.0  0.0 - 0.2 % Final  . Neutrophils Relative % 01/29/2019 83  % Final  . Neutro Abs 01/29/2019 8.7* 1.7 - 7.7 K/uL Final  . Lymphocytes Relative 01/29/2019 13  % Final  . Lymphs Abs 01/29/2019 1.4  0.7 - 4.0 K/uL Final  . Monocytes Relative 01/29/2019 2  % Final  . Monocytes Absolute 01/29/2019 0.2  0.1 - 1.0 K/uL Final  . Eosinophils Relative 01/29/2019 1  % Final  . Eosinophils Absolute  01/29/2019 0.1  0.0 - 0.5 K/uL Final  . Basophils Relative 01/29/2019 1  % Final  . Basophils Absolute 01/29/2019 0.1  0.0 - 0.1 K/uL Final  . Immature Granulocytes 01/29/2019 0  % Final  . Abs Immature Granulocytes 01/29/2019 0.04  0.00 - 0.07 K/uL Final   Performed at [facility], [place] [address] [place], [place] [address]    (this displays the last labs from the last 3 days)  No results found for: TOTALPROTELP, ALBUMINELP, A1GS, A2GS, BETS, BETA2SER, GAMS, MSPIKE, SPEI (this displays SPEP labs)  No results found for: KPAFRELGTCHN, LAMBDASER, KAPLAMBRATIO (kappa/lambda light chains)  No results found for: HGBA, HGBA2QUANT, HGBFQUANT, HGBSQUAN (Hemoglobinopathy evaluation)   Lab Results  Component Value Date   LDH 244 (H) 12/25/2018    Lab Results  Component Value Date   IRON 137 10/26/2018   TIBC 384 10/26/2018   IRONPCTSAT 36 (H) 10/26/2018   (Iron and TIBC)  Lab Results  Component Value Date   FERRITIN 2,306 (H) 12/25/2018    Urinalysis    Component Value Date/Time   COLORURINE STRAW (A) 01/15/2019 [place] 01/15/2019 1150   LABSPEC 1.008 01/15/2019 1150   PHURINE 6.0 01/15/2019 1150   GLUCOSEU NEGATIVE 01/15/2019 [place] 01/15/2019 [place] 01/15/2019 [place] 01/15/2019 [place] 01/15/2019 1150   NITRITE NEGATIVE 01/15/2019 1150  LEUKOCYTESUR TRACE (A) 01/15/2019 1150     STUDIES:     ELIGIBLE FOR AVAILABLE RESEARCH PROTOCOL: no   ASSESSMENT: 58 y.o. [place], [place] woman presenting 10/26/2018 with right-sided inflammatory breast cancer, stage IV, involvnh lungs, lymph nodes and bones, as follows:  (a) chest CT scan 10/26/2018 shows bilateral pleural effusions, possible lymphangitic spread of tumor, diffuse bony metastatic disease, and significant axillary mediastinal and hilar adenopathy  (b) bone scan 10/27/2018 is a "near  [place]" consistent with widespread bony metastatic disease  (c) head CT with and without contrast 10/30/2018 shows no intracranial metastatic disease, multiple calvarial lesions  (d) CA-27-29 on 10/27/2018 was 1810.4  (1) pleural fluid from right thoracentesis 10/28/2018 confirms malignant cells consistent with a breast primary, strongly estrogen and progesterone receptor positive, HER-2 not amplified, with an MIB-1 of 2%  (2) anastrozole started 10/29/2018  (a) palbociclib to start 11/13/2018  (b) denosumab/xgeva to start 11/13/2018  (3) associated problems:  (a) hypoxia secondary to effusions  (b) pain from bone lesions  (c) right upper extremity lymphedema  (d) anemia, requiring transfusion  (e) poor venous access  (4) genetics testing pending    PLAN:  Suzanne [name] is having some difficulty since discharge due to her weakness and debilitation.  Some of her consults are still pending and I will ask our social worker to f/u on this.  She is taking her Anastrozole and Palbociclib and tolerating these well.  She will continue these.    I am concerned about her slight elevated WBC, left shift, and abnormal lung exam and cough, that she may be developing a pneumonia.  Dr. Jana Hakim would like for her to start a Zpack for this.  He met with Suzanne [name] and Suzanne [name] today.    Marlaina is low on Xanax and Claritin, and I refilled those today.    Sheenah will return in 1 week for labs and f/u.  She was recommended to continue with the appropriate pandemic precautions. She and her daughter know to call for any questions or concerns between now and her next appointment.  Wilber Bihari, NP  01/29/19 2:46 PM Medical Oncology and Hematology [facility] [address] [place], [place] [address] Tel. [phone]    Fax. [phone]    ADDENDUM: I was glad to finally meet Suzanne [name]'s daughter today.  She appears very functional despite her history of cerebral palsy.  Lorel herself  continues to improve at a glacial pace but nevertheless keeps moving in the right direction.  They now have a ramp allowing her to get in and out of the house more easily.  She has oxygen at home.  She is tolerating her medications well and her tumor marker has decreased from 1800 263 at the last determination.  Accordingly we are continuing maximal supportive care until the patient fully regains her functional status.  She will see [place] again in a week.  She knows to call for any other issues that may develop before that visit.  I personally saw this patient and performed a substantive portion of this encounter with the listed APP documented above.   Chauncey Cruel, MD Medical Oncology and Hematology [facility] [address] [place], [place] [address] Tel. [phone]    Fax. [phone]

## 2019-01-30 ENCOUNTER — Telehealth: Payer: Self-pay

## 2019-01-30 DIAGNOSIS — I5032 Chronic diastolic (congestive) heart failure: Secondary | ICD-10-CM | POA: Diagnosis not present

## 2019-01-30 DIAGNOSIS — C773 Secondary and unspecified malignant neoplasm of axilla and upper limb lymph nodes: Secondary | ICD-10-CM | POA: Diagnosis not present

## 2019-01-30 DIAGNOSIS — C50811 Malignant neoplasm of overlapping sites of right female breast: Secondary | ICD-10-CM | POA: Diagnosis not present

## 2019-01-30 DIAGNOSIS — C7951 Secondary malignant neoplasm of bone: Secondary | ICD-10-CM | POA: Diagnosis not present

## 2019-01-30 DIAGNOSIS — Z8701 Personal history of pneumonia (recurrent): Secondary | ICD-10-CM | POA: Diagnosis not present

## 2019-01-30 DIAGNOSIS — J9611 Chronic respiratory failure with hypoxia: Secondary | ICD-10-CM | POA: Diagnosis not present

## 2019-01-30 DIAGNOSIS — M84522D Pathological fracture in neoplastic disease, left humerus, subsequent encounter for fracture with routine healing: Secondary | ICD-10-CM | POA: Diagnosis not present

## 2019-01-30 DIAGNOSIS — I110 Hypertensive heart disease with heart failure: Secondary | ICD-10-CM | POA: Diagnosis not present

## 2019-01-30 DIAGNOSIS — M84552D Pathological fracture in neoplastic disease, left femur, subsequent encounter for fracture with routine healing: Secondary | ICD-10-CM | POA: Diagnosis not present

## 2019-01-30 DIAGNOSIS — Z79891 Long term (current) use of opiate analgesic: Secondary | ICD-10-CM | POA: Diagnosis not present

## 2019-01-30 DIAGNOSIS — J9621 Acute and chronic respiratory failure with hypoxia: Secondary | ICD-10-CM | POA: Diagnosis not present

## 2019-01-30 LAB — CANCER ANTIGEN 27.29: CA 27.29: 108.1 U/mL — ABNORMAL HIGH (ref 0.0–38.6)

## 2019-01-30 NOTE — Telephone Encounter (Signed)
RN spoke with Tye Maryland from [facility].  Pt has been approved for Medicaid.  Tye Maryland is collaborating to have personal care services to assist patient and provider care-giver relief for daughter when needed.    Tye Maryland will be faxing over paperwork to be completed by MD.

## 2019-02-03 ENCOUNTER — Other Ambulatory Visit: Payer: Self-pay

## 2019-02-03 ENCOUNTER — Telehealth: Payer: Self-pay

## 2019-02-03 ENCOUNTER — Inpatient Hospital Stay (HOSPITAL_COMMUNITY)
Admission: EM | Admit: 2019-02-03 | Discharge: 2019-02-07 | DRG: 543 | Disposition: A | Discharge status: Skilled Nursing Facility | Payer: Medicaid Other | Attending: Internal Medicine | Admitting: Internal Medicine

## 2019-02-03 ENCOUNTER — Encounter (HOSPITAL_COMMUNITY): Payer: Self-pay | Admitting: Emergency Medicine

## 2019-02-03 ENCOUNTER — Emergency Department (HOSPITAL_COMMUNITY): Payer: Medicaid Other

## 2019-02-03 DIAGNOSIS — J910 Malignant pleural effusion: Secondary | ICD-10-CM | POA: Diagnosis present

## 2019-02-03 DIAGNOSIS — Z79811 Long term (current) use of aromatase inhibitors: Secondary | ICD-10-CM

## 2019-02-03 DIAGNOSIS — M549 Dorsalgia, unspecified: Secondary | ICD-10-CM | POA: Diagnosis present

## 2019-02-03 DIAGNOSIS — M5134 Other intervertebral disc degeneration, thoracic region: Secondary | ICD-10-CM | POA: Diagnosis not present

## 2019-02-03 DIAGNOSIS — M47814 Spondylosis without myelopathy or radiculopathy, thoracic region: Secondary | ICD-10-CM | POA: Diagnosis not present

## 2019-02-03 DIAGNOSIS — Z7722 Contact with and (suspected) exposure to environmental tobacco smoke (acute) (chronic): Secondary | ICD-10-CM | POA: Diagnosis present

## 2019-02-03 DIAGNOSIS — G893 Neoplasm related pain (acute) (chronic): Secondary | ICD-10-CM

## 2019-02-03 DIAGNOSIS — S22081A Stable burst fracture of T11-T12 vertebra, initial encounter for closed fracture: Secondary | ICD-10-CM | POA: Diagnosis not present

## 2019-02-03 DIAGNOSIS — J9612 Chronic respiratory failure with hypercapnia: Secondary | ICD-10-CM | POA: Diagnosis not present

## 2019-02-03 DIAGNOSIS — Z8042 Family history of malignant neoplasm of prostate: Secondary | ICD-10-CM

## 2019-02-03 DIAGNOSIS — S22000A Wedge compression fracture of unspecified thoracic vertebra, initial encounter for closed fracture: Secondary | ICD-10-CM | POA: Diagnosis present

## 2019-02-03 DIAGNOSIS — I10 Essential (primary) hypertension: Secondary | ICD-10-CM | POA: Diagnosis not present

## 2019-02-03 DIAGNOSIS — E876 Hypokalemia: Secondary | ICD-10-CM | POA: Diagnosis present

## 2019-02-03 DIAGNOSIS — Z808 Family history of malignant neoplasm of other organs or systems: Secondary | ICD-10-CM

## 2019-02-03 DIAGNOSIS — Z170 Estrogen receptor positive status [ER+]: Secondary | ICD-10-CM | POA: Diagnosis not present

## 2019-02-03 DIAGNOSIS — C50919 Malignant neoplasm of unspecified site of unspecified female breast: Secondary | ICD-10-CM

## 2019-02-03 DIAGNOSIS — C50811 Malignant neoplasm of overlapping sites of right female breast: Secondary | ICD-10-CM | POA: Diagnosis present

## 2019-02-03 DIAGNOSIS — Z803 Family history of malignant neoplasm of breast: Secondary | ICD-10-CM

## 2019-02-03 DIAGNOSIS — Z79891 Long term (current) use of opiate analgesic: Secondary | ICD-10-CM

## 2019-02-03 DIAGNOSIS — D539 Nutritional anemia, unspecified: Secondary | ICD-10-CM | POA: Diagnosis not present

## 2019-02-03 DIAGNOSIS — M4804 Spinal stenosis, thoracic region: Secondary | ICD-10-CM | POA: Diagnosis not present

## 2019-02-03 DIAGNOSIS — C7951 Secondary malignant neoplasm of bone: Secondary | ICD-10-CM | POA: Diagnosis present

## 2019-02-03 DIAGNOSIS — M5136 Other intervertebral disc degeneration, lumbar region: Secondary | ICD-10-CM | POA: Diagnosis not present

## 2019-02-03 DIAGNOSIS — H409 Unspecified glaucoma: Secondary | ICD-10-CM | POA: Diagnosis present

## 2019-02-03 DIAGNOSIS — K5900 Constipation, unspecified: Secondary | ICD-10-CM | POA: Diagnosis present

## 2019-02-03 DIAGNOSIS — Z20828 Contact with and (suspected) exposure to other viral communicable diseases: Secondary | ICD-10-CM | POA: Diagnosis present

## 2019-02-03 DIAGNOSIS — Z03818 Encounter for observation for suspected exposure to other biological agents ruled out: Secondary | ICD-10-CM | POA: Diagnosis not present

## 2019-02-03 DIAGNOSIS — J9611 Chronic respiratory failure with hypoxia: Secondary | ICD-10-CM | POA: Diagnosis not present

## 2019-02-03 DIAGNOSIS — C7801 Secondary malignant neoplasm of right lung: Secondary | ICD-10-CM

## 2019-02-03 DIAGNOSIS — C50911 Malignant neoplasm of unspecified site of right female breast: Secondary | ICD-10-CM

## 2019-02-03 DIAGNOSIS — Z800 Family history of malignant neoplasm of digestive organs: Secondary | ICD-10-CM

## 2019-02-03 DIAGNOSIS — D630 Anemia in neoplastic disease: Secondary | ICD-10-CM | POA: Diagnosis present

## 2019-02-03 DIAGNOSIS — M8458XA Pathological fracture in neoplastic disease, other specified site, initial encounter for fracture: Principal | ICD-10-CM | POA: Diagnosis present

## 2019-02-03 DIAGNOSIS — Z6829 Body mass index (BMI) 29.0-29.9, adult: Secondary | ICD-10-CM

## 2019-02-03 DIAGNOSIS — Z79899 Other long term (current) drug therapy: Secondary | ICD-10-CM

## 2019-02-03 DIAGNOSIS — D6481 Anemia due to antineoplastic chemotherapy: Secondary | ICD-10-CM | POA: Diagnosis present

## 2019-02-03 DIAGNOSIS — R5381 Other malaise: Secondary | ICD-10-CM | POA: Diagnosis present

## 2019-02-03 DIAGNOSIS — Z8249 Family history of ischemic heart disease and other diseases of the circulatory system: Secondary | ICD-10-CM

## 2019-02-03 LAB — CBC WITH DIFFERENTIAL/PLATELET
Abs Immature Granulocytes: 0.03 10*3/uL (ref 0.00–0.07)
Basophils Absolute: 0 10*3/uL (ref 0.0–0.1)
Basophils Relative: 1 %
Eosinophils Absolute: 0.1 10*3/uL (ref 0.0–0.5)
Eosinophils Relative: 2 %
HCT: 33.1 % — ABNORMAL LOW (ref 36.0–46.0)
Hemoglobin: 9.8 g/dL — ABNORMAL LOW (ref 12.0–15.0)
Immature Granulocytes: 1 %
Lymphocytes Relative: 26 %
Lymphs Abs: 1.3 10*3/uL (ref 0.7–4.0)
MCH: 31.4 pg (ref 26.0–34.0)
MCHC: 29.6 g/dL — ABNORMAL LOW (ref 30.0–36.0)
MCV: 106.1 fL — ABNORMAL HIGH (ref 80.0–100.0)
Monocytes Absolute: 0.2 10*3/uL (ref 0.1–1.0)
Monocytes Relative: 4 %
Neutro Abs: 3.2 10*3/uL (ref 1.7–7.7)
Neutrophils Relative %: 66 %
Platelets: 456 10*3/uL — ABNORMAL HIGH (ref 150–400)
RBC: 3.12 MIL/uL — ABNORMAL LOW (ref 3.87–5.11)
RDW: 19.1 % — ABNORMAL HIGH (ref 11.5–15.5)
WBC: 4.9 10*3/uL (ref 4.0–10.5)
nRBC: 0 % (ref 0.0–0.2)

## 2019-02-03 LAB — BASIC METABOLIC PANEL
Anion gap: 15 (ref 5–15)
BUN: 12 mg/dL (ref 6–20)
CO2: 35 mmol/L — ABNORMAL HIGH (ref 22–32)
Calcium: 8.9 mg/dL (ref 8.9–10.3)
Chloride: 91 mmol/L — ABNORMAL LOW (ref 98–111)
Creatinine, Ser: 0.83 mg/dL (ref 0.44–1.00)
GFR calc Af Amer: >60 mL/min (ref >60)
GFR calc non Af Amer: >60 mL/min (ref >60)
Glucose, Bld: 122 mg/dL — ABNORMAL HIGH (ref 70–99)
Potassium: 2.7 mmol/L — CL (ref 3.5–5.1)
Sodium: 141 mmol/L (ref 135–145)

## 2019-02-03 LAB — SARS CORONAVIRUS 2 BY RT PCR (HOSPITAL ORDER, PERFORMED IN ~~LOC~~ HOSPITAL LAB): SARS Coronavirus 2: NEGATIVE

## 2019-02-03 MED ORDER — AZITHROMYCIN 250 MG PO TABS
500.0000 mg | ORAL_TABLET | ORAL | Status: AC
  Administered 2019-02-04: 01:00:00 500 mg via ORAL

## 2019-02-03 MED ORDER — HYDROMORPHONE HCL 1 MG/ML IJ SOLN: 1.0000 mg | INTRAMUSCULAR | Status: DC | PRN

## 2019-02-03 MED ORDER — DEXAMETHASONE SODIUM PHOSPHATE 4 MG/ML IJ SOLN
2.0000 mg | Freq: Two times a day (BID) | INTRAMUSCULAR | Status: DC
  Administered 2019-02-04 – 2019-02-07 (×7): 2 mg via INTRAVENOUS
  Filled 2019-02-03 (×7): qty 1

## 2019-02-03 MED ORDER — MAGNESIUM SULFATE 2 GM/50ML IV SOLN
2.0000 g | Freq: Once | INTRAVENOUS | Status: AC
  Administered 2019-02-04: 2 g via INTRAVENOUS
  Filled 2019-02-03: qty 50

## 2019-02-03 MED ORDER — BENZONATATE 100 MG PO CAPS: 200.0000 mg | ORAL_CAPSULE | Freq: Three times a day (TID) | ORAL | Status: DC | PRN

## 2019-02-03 MED ORDER — POLYETHYLENE GLYCOL 3350 17 G PO PACK
17.0000 g | PACK | Freq: Every day | ORAL | Status: DC | PRN
  Administered 2019-02-05: 17 g via ORAL
  Filled 2019-02-03: qty 1

## 2019-02-03 MED ORDER — GABAPENTIN 100 MG PO CAPS
100.0000 mg | ORAL_CAPSULE | Freq: Two times a day (BID) | ORAL | Status: DC
  Administered 2019-02-04 – 2019-02-07 (×8): 100 mg via ORAL
  Filled 2019-02-03 (×8): qty 1

## 2019-02-03 MED ORDER — NYSTATIN 100000 UNIT/GM EX POWD
Freq: Three times a day (TID) | CUTANEOUS | Status: DC
  Administered 2019-02-04 – 2019-02-07 (×11): via TOPICAL
  Filled 2019-02-03 (×3): qty 15

## 2019-02-03 MED ORDER — ALBUTEROL SULFATE HFA 108 (90 BASE) MCG/ACT IN AERS: 2.0000 | INHALATION_SPRAY | Freq: Four times a day (QID) | RESPIRATORY_TRACT | Status: DC | PRN

## 2019-02-03 MED ORDER — OXYCODONE HCL 5 MG PO TABS: 2.5000 mg | ORAL_TABLET | ORAL | Status: DC | PRN

## 2019-02-03 MED ORDER — PALBOCICLIB 100 MG PO CAPS
100.0000 mg | ORAL_CAPSULE | Freq: Every day | ORAL | Status: DC
  Administered 2019-02-04 – 2019-02-07 (×4): 100 mg via ORAL

## 2019-02-03 MED ORDER — ANASTROZOLE 1 MG PO TABS
1.0000 mg | ORAL_TABLET | Freq: Every day | ORAL | Status: DC
  Administered 2019-02-04 – 2019-02-07 (×4): 1 mg via ORAL
  Filled 2019-02-03 (×5): qty 1

## 2019-02-03 MED ORDER — AZITHROMYCIN 250 MG PO TABS
250.0000 mg | ORAL_TABLET | Freq: Every day | ORAL | Status: DC
  Filled 2019-02-03: qty 1

## 2019-02-03 MED ORDER — CARVEDILOL 3.125 MG PO TABS
3.1250 mg | ORAL_TABLET | Freq: Two times a day (BID) | ORAL | Status: DC
  Administered 2019-02-04 – 2019-02-07 (×7): 3.125 mg via ORAL
  Filled 2019-02-03 (×7): qty 1

## 2019-02-03 MED ORDER — ACETAMINOPHEN 325 MG PO TABS: 650.0000 mg | ORAL_TABLET | Freq: Four times a day (QID) | ORAL | Status: DC | PRN

## 2019-02-03 MED ORDER — DEXAMETHASONE SODIUM PHOSPHATE 10 MG/ML IJ SOLN
10.0000 mg | Freq: Once | INTRAMUSCULAR | Status: AC
  Administered 2019-02-04: 10 mg via INTRAVENOUS
  Filled 2019-02-03: qty 1

## 2019-02-03 MED ORDER — ENOXAPARIN SODIUM 40 MG/0.4ML ~~LOC~~ SOLN
40.0000 mg | Freq: Every day | SUBCUTANEOUS | Status: DC
  Administered 2019-02-04 – 2019-02-06 (×4): 40 mg via SUBCUTANEOUS
  Filled 2019-02-03 (×4): qty 0.4

## 2019-02-03 MED ORDER — SODIUM CHLORIDE 0.9% FLUSH
3.0000 mL | INTRAVENOUS | Status: DC | PRN
  Administered 2019-02-04: 3 mL via INTRAVENOUS
  Filled 2019-02-03: qty 3

## 2019-02-03 MED ORDER — ALPRAZOLAM 0.25 MG PO TABS: 0.2500 mg | ORAL_TABLET | Freq: Three times a day (TID) | ORAL | Status: DC | PRN

## 2019-02-03 MED ORDER — BISACODYL 5 MG PO TBEC: 5.0000 mg | DELAYED_RELEASE_TABLET | Freq: Every day | ORAL | Status: DC | PRN

## 2019-02-03 MED ORDER — ONDANSETRON HCL 4 MG PO TABS: 4.0000 mg | ORAL_TABLET | Freq: Four times a day (QID) | ORAL | Status: DC | PRN

## 2019-02-03 MED ORDER — OXYCODONE HCL 5 MG PO TABS
5.0000 mg | ORAL_TABLET | ORAL | Status: DC | PRN
  Administered 2019-02-04 – 2019-02-06 (×8): 5 mg via ORAL
  Filled 2019-02-03 (×8): qty 1

## 2019-02-03 MED ORDER — SODIUM CHLORIDE 0.9% FLUSH
3.0000 mL | Freq: Two times a day (BID) | INTRAVENOUS | Status: DC
  Administered 2019-02-04 – 2019-02-07 (×8): 3 mL via INTRAVENOUS

## 2019-02-03 MED ORDER — LIDOCAINE 5 % EX PTCH
1.0000 | MEDICATED_PATCH | CUTANEOUS | Status: DC
  Administered 2019-02-04 – 2019-02-06 (×4): 1 via TRANSDERMAL
  Filled 2019-02-03 (×6): qty 1

## 2019-02-03 MED ORDER — ACETAMINOPHEN 650 MG RE SUPP: 650.0000 mg | Freq: Four times a day (QID) | RECTAL | Status: DC | PRN

## 2019-02-03 MED ORDER — POTASSIUM CHLORIDE CRYS ER 20 MEQ PO TBCR
40.0000 meq | EXTENDED_RELEASE_TABLET | Freq: Once | ORAL | Status: AC
  Administered 2019-02-03: 40 meq via ORAL
  Filled 2019-02-03: qty 2

## 2019-02-03 MED ORDER — ONDANSETRON HCL 4 MG/2ML IJ SOLN: 4.0000 mg | Freq: Four times a day (QID) | INTRAMUSCULAR | Status: DC | PRN

## 2019-02-03 MED ORDER — SODIUM CHLORIDE 0.9 % IV SOLN: 250.0000 mL | INTRAVENOUS | Status: DC | PRN

## 2019-02-03 MED ORDER — HYDROMORPHONE HCL 1 MG/ML IJ SOLN
1.0000 mg | Freq: Once | INTRAMUSCULAR | Status: AC
  Administered 2019-02-03: 1 mg via INTRAVENOUS
  Filled 2019-02-03: qty 1

## 2019-02-03 MED ORDER — POTASSIUM CHLORIDE 10 MEQ/100ML IV SOLN
10.0000 meq | Freq: Once | INTRAVENOUS | Status: AC
  Administered 2019-02-03: 19:00:00 10 meq via INTRAVENOUS
  Filled 2019-02-03: qty 100

## 2019-02-03 MED ORDER — SENNA 8.6 MG PO TABS
1.0000 | ORAL_TABLET | Freq: Two times a day (BID) | ORAL | Status: DC
  Administered 2019-02-04 – 2019-02-07 (×8): 8.6 mg via ORAL
  Filled 2019-02-03 (×8): qty 1

## 2019-02-03 NOTE — ED Notes (Signed)
Per patient request, patient's daughter called and provided with update.

## 2019-02-03 NOTE — ED Triage Notes (Signed)
Pt BIBA from home.   Per EMS- Pt c/o back pain x6 days, mid-upper back.  Denies recent falls.  Titanium plate on left shoulder, pt arrives with left arm in sling. Denies fever, reports "chills that last 5 seconds."  AOx4, pain worse with ambulation 130/82 100 HR 16 3L 2 [place]- 96% - this is pts baseline O2 Temp- 98.1

## 2019-02-03 NOTE — ED Provider Notes (Signed)
[facility] DEPT Provider Note   CSN: [id number] Arrival date & time: 02/03/19  1542     History   Chief Complaint Chief Complaint  Patient presents with  . Back Pain    HPI CAROLY [name] is a 58 y.o. female.     Patient is a 58 year old female with history of metastatic breast cancer.  She presents today with complaints of pain in her mid back.  This started approximately 4 days ago and is rapidly worsening.  She describes severe pain when she attempts to twist or sit up.  She denies any radiation into her legs.  She denies any weakness or tingling.  She denies any bowel or bladder complaints.  The history is provided by the patient.  Back Pain Location:  Thoracic spine and lumbar spine Quality:  Stabbing Radiates to:  Does not radiate Pain severity:  Severe Pain is:  Worse during the day Onset quality:  Sudden Duration:  4 days Timing:  Constant Progression:  Worsening Chronicity:  New Relieved by:  Nothing Worsened by:  Palpation, bending, ambulation, movement, sitting and twisting Associated symptoms: no fever     Past Medical History:  Diagnosis Date  . Cancer ([place])   . Family history of breast cancer   . Family history of colon cancer   . Family history of melanoma   . Family history of prostate cancer   . Gallstones   . Glaucoma   . Mastitis    reports history of recurrent mastitis  . Metastatic breast cancer ([place])   . Obesity     Patient Active Problem List   Diagnosis Date Noted  . Genetic testing 01/27/2019  . Acute pharyngitis   . Bacteremia   . FTT (failure to thrive) in adult   . Acute on chronic diastolic CHF (congestive heart failure) ([place])   . Fracture closed, humerus, shaft 01/09/2019  . Pneumonia 12/25/2018  . Acute respiratory disorder in immunocompromised patient ([place]) 12/25/2018  . Bone metastases ([place]) 12/24/2018  . Family history of breast cancer   . Family history of prostate cancer   . Family  history of melanoma   . Family history of colon cancer   . Pressure injury of skin 11/24/2018  . Acute on chronic respiratory failure with hypoxia ([place]) 11/19/2018  . 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 Hypophosphatemia 11/19/2018  . Hypocalcemia 11/19/2018  . Aspiration pneumonia ([place]) 11/19/2018  . SOB (shortness of breath)   . Palliative care by specialist   . Carcinoma of breast, estrogen receptor positive, stage 4, right ([place]) 11/16/2018  . Hypoalbuminemia 11/16/2018  . Pleural effusion 11/14/2018  . Goals of care, counseling/discussion 11/06/2018  . Malignant neoplasm of overlapping sites of right breast in female, estrogen receptor positive ([place]) 10/31/2018  . Lung metastasis ([place]) 10/31/2018  . Pain from bone metastases ([place]) 10/31/2018  . Morbid obesity with BMI of 40.0-44.9, adult ([place]) 10/31/2018  . Bone injury   . Pleural effusion, bilateral   . Shortness of breath   . Abnormal breast finding   . Hypokalemia   . Breast skin changes   . Anemia   . Bone metastasis ([place])   . Acute anemia 10/26/2018    Past Surgical History:  Procedure Laterality Date  . GLAUCOMA SURGERY  late 1990's  . IR THORACENTESIS ASP PLEURAL SPACE W/IMG GUIDE  10/28/2018  . IR THORACENTESIS ASP PLEURAL SPACE W/IMG GUIDE  10/29/2018  . OPEN REDUCTION INTERNAL FIXATION (ORIF) DISTAL RADIAL FRACTURE Left 01/09/2019  Procedure: OPEN REDUCTION INTERNAL FIXATION (ORIF) HUMERAL FRACTURE;  Surgeon: Netta Cedars, MD;  Location: [facility] ORS;  Service: Orthopedics;  Laterality: Left;     OB History   No obstetric history on file.      Home Medications    Prior to Admission medications   Medication Sig Start Date End Date Taking? Authorizing Provider  albuterol (VENTOLIN HFA) 108 (90 Base) MCG/ACT inhaler Inhale 2 puffs into the lungs every 6 (six) hours as needed for wheezing or shortness of breath. 12/01/18   Rai, Vernelle Emerald, MD  ALPRAZolam Duanne Moron) 0.25 MG tablet Take 1-2 tablets (0.25-0.5 mg total)  by mouth every 8 (eight) hours as needed for anxiety. 01/29/19   Gardenia Phlegm, NP  anastrozole (ARIMIDEX) 1 MG tablet Take 1 tablet (1 mg total) by mouth daily. 12/12/18   [name], Virgie Dad, MD  antiseptic oral rinse (BIOTENE) LIQD 15 mLs by Mouth Rinse route as needed for dry mouth.    [provider]  azithromycin (ZITHROMAX Z-PAK) 250 MG tablet 2 tab on day 1 then 1 tab daily until complete 01/29/19   Causey, Charlestine Massed, NP  benzonatate (TESSALON) 200 MG capsule TAKE 1 CAPSULE BY MOUTH 3 TIMES DAILY IF NEEDED Patient taking differently: Take 200 mg by mouth 3 (three) times daily as needed for cough.  12/23/18   [name], Virgie Dad, MD  calcium-vitamin D (OSCAL WITH D) 500-200 MG-UNIT tablet Take 1 tablet by mouth 3 (three) times daily. 11/30/18   Rai, Vernelle Emerald, MD  carvedilol (COREG) 3.125 MG tablet Take 1 tablet (3.125 mg total) by mouth 2 (two) times daily with a meal. 01/24/19 02/23/19  Donne Hazel, MD  furosemide (LASIX) 20 MG tablet Take 2 tablets (40 mg total) by mouth 2 (two) times daily. 12/09/18   Gardenia Phlegm, NP  loratadine (CLARITIN) 10 MG tablet Take 1 tablet (10 mg total) by mouth daily. 11/29/18   Rai, Ripudeep K, MD  nystatin (MYCOSTATIN/NYSTOP) powder Apply topically 3 (three) times daily. Apply under breast area 11/28/18   Rai, Ripudeep K, MD  oxyCODONE (OXY IR/ROXICODONE) 5 MG immediate release tablet Take 0.5 tablets (2.5 mg total) by mouth every 4 (four) hours as needed for moderate pain or severe pain. 01/24/19   Donne Hazel, MD  palbociclib [name]) 100 MG capsule Take 1 capsule (100 mg total) by mouth daily with breakfast. Take whole with food. Take for 21 days on, 7 days off, repeat every 28 days. 12/22/18   [name], Virgie Dad, MD  polyethylene glycol (MIRALAX / GLYCOLAX) 17 g packet Take 17 g by mouth as needed for constipation.    [provider]    Family History Family History  Problem Relation Age of Onset  . Breast cancer  Sister        in her 50's-70s  . Heart disease Brother        CABG  . Heart disease Brother        CABG  . Skin cancer Brother        melanoma  . Colon cancer Cousin   . Heart attack Mother   . Heart attack Father   . Prostate cancer Brother     Social History Social History   Tobacco Use  . Smoking status: Never Smoker  . Smokeless tobacco: Never Used  . Tobacco comment: second hand smoke exposure  Substance Use Topics  . Alcohol use: No  . Drug use: No     Allergies  Patient has no known allergies.   Review of Systems Review of Systems  Constitutional: Negative for fever.  Musculoskeletal: Positive for back pain.  All other systems reviewed and are negative.    Physical Exam Updated Vital Signs BP 116/69   Pulse (!) 103   Temp 98.5 F (36.9 C)   Resp 15   LMP 05/28/2013 (Within Months)   SpO2 98%   Physical Exam Vitals signs and nursing note reviewed.  Constitutional:      General: She is not in acute distress.    Appearance: She is well-developed. She is not diaphoretic.  HENT:     Head: Normocephalic and atraumatic.  Neck:     Musculoskeletal: Normal range of motion and neck supple.  Cardiovascular:     Rate and Rhythm: Normal rate and regular rhythm.     Heart sounds: No murmur. No friction rub. No gallop.   Pulmonary:     Effort: Pulmonary effort is normal. No respiratory distress.     Breath sounds: Normal breath sounds. No wheezing.  Abdominal:     General: Bowel sounds are normal. There is no distension.     Palpations: Abdomen is soft.     Tenderness: There is no abdominal tenderness.  Musculoskeletal: Normal range of motion.     Comments: There is tenderness to palpation just to the left of the lower thoracic/upper lumbar spine.  There is no step-off.  Skin:    General: Skin is warm and dry.  Neurological:     Mental Status: She is alert and oriented to person, place, and time.     Comments: Strength is 5 out of 5 in both lower  extremities.  Sensation is intact throughout both lower extremities.      ED Treatments / Results  Labs (all labs ordered are listed, but only abnormal results are displayed) Labs Reviewed  BASIC METABOLIC PANEL  CBC WITH DIFFERENTIAL/PLATELET    EKG None  Radiology No results found.  Procedures Procedures (including critical care time)  Medications Ordered in ED Medications  HYDROmorphone (DILAUDID) injection 1 mg (has no administration in time range)     Initial Impression / Assessment and Plan / ED Course  I have reviewed the triage vital signs and the nursing notes.  Pertinent labs & imaging results that were available during my care of the patient were reviewed by me and considered in my medical decision making (see chart for details).  Patient presenting here with complaints of severe pain in her mid back that started 4 days ago.  This began in the absence of any injury or trauma.  Patient with history of metastatic breast cancer with bony metastasis.  She recently experienced a pathologic fracture of her left arm requiring surgery.  Patient's imaging studies today reveal a compression fracture of T12 with 50% loss of height and slight retropulsion.  This was discussed with neurosurgery who does not feel as though this is a surgical issue, but will consult on the patient.  Patient unable to sit up or turn and I feel is not going to do well if discharged.  Patient will be admitted to the hospitalist service for pain control.  I have spoken with Dr. Myna Hidalgo who agrees to admit.  Final Clinical Impressions(s) / ED Diagnoses   Final diagnoses:  None    ED Discharge Orders    None       Veryl Speak, MD 02/03/19 [phone]

## 2019-02-03 NOTE — ED Notes (Signed)
Transport called to take patient to the floor.

## 2019-02-03 NOTE — H&P (Signed)
History and Physical    [name] [id number] DOB: 11/13/1960 DOA: 02/03/2019  PCP: Patient, No Pcp Per   Patient coming from: Home   Chief Complaint: Back pain   HPI: Suzanne [name] is a 58 y.o. 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  The patient reports several days of pain in the mid to upper back that has been rapidly worsening and severe to the point where she is unable to move without exacerbating the pain.  She denied any incontinence, weakness, or paresthesias associated with this.  She has not noted any fevers.  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  Her treatment is also been complicated by anemia requiring transfusions.  She was discharged from the hospital just over a week ago and SNF was recommended at that time, but due to lack of bed availability, she went home with home health.  She reports having adequate social support and states that her daughter works very hard to help her at home, but the patient is concerned that with her increase independence for basic ADLs, she will require more assistance than her daughter can provide. She denies any change in her chronic cough and SOB, reports starting azithromycin a couple days ago due to elevated WBC.   ED Course: Upon arrival to the ED, patient is found to be afebrile, saturating well on her usual 2 L/min, and with stable blood pressure.  Chemistry panel is notable for potassium of 2.7 and bicarbonate of 35.  CBC features a stable macrocytic anemia and thrombocytosis.  CT of the thoracic and lumbar spine is concerning for diffuse osseous metastatic disease with acute or subacute  T12 compression deformity.  Patient was given Dilaudid, oral and IV potassium, and neurosurgery was consulted by the ED physician, recommending medical admission.  Review of Systems:  All other systems reviewed and apart from HPI, are negative.  Past Medical History:  Diagnosis Date   Cancer [facility])    Family history of breast cancer    Family history of colon cancer    Family history of melanoma    Family history of prostate cancer    Gallstones    Glaucoma    Mastitis    reports history of recurrent mastitis   Metastatic breast cancer ([place])    Obesity     Past Surgical History:  Procedure Laterality Date   GLAUCOMA SURGERY  late 1990's   IR THORACENTESIS ASP PLEURAL SPACE W/IMG GUIDE  10/28/2018   IR THORACENTESIS ASP PLEURAL SPACE W/IMG GUIDE  10/29/2018   OPEN REDUCTION INTERNAL FIXATION (ORIF) DISTAL RADIAL FRACTURE Left 01/09/2019   Procedure: OPEN REDUCTION INTERNAL FIXATION (ORIF) HUMERAL FRACTURE;  Surgeon: Netta Cedars, MD;  Location: [facility];  Service: Orthopedics;  Laterality: Left;     reports that she has never smoked. She has never used smokeless tobacco. She reports that she does not drink alcohol or use drugs.  No Known Allergies  Family History  Problem Relation Age of Onset   Breast cancer Sister        in her 53's-70s   Heart disease Brother        CABG   Heart disease Brother  CABG   Skin cancer Brother        melanoma   Colon cancer Cousin    Heart attack Mother    Heart attack Father    Prostate cancer Brother      Prior to Admission medications   Medication Sig Start Date End Date Taking? Authorizing Provider  albuterol (VENTOLIN HFA) 108 (90 Base) MCG/ACT inhaler Inhale 2 puffs into the lungs every 6 (six) hours as needed for wheezing or shortness of breath. 12/01/18  Yes Rai, Ripudeep K, MD  ALPRAZolam (XANAX) 0.25 MG tablet Take 1-2 tablets (0.25-0.5 mg total) by mouth every 8 (eight) hours as needed for anxiety.  01/29/19  Yes Causey, Charlestine Massed, NP  anastrozole (ARIMIDEX) 1 MG tablet Take 1 tablet (1 mg total) by mouth daily. 12/12/18  Yes [name], Virgie Dad, MD  antiseptic oral rinse (BIOTENE) LIQD 15 mLs by Mouth Rinse route as needed for dry mouth.   Yes [provider]  azithromycin (ZITHROMAX Z-PAK) 250 MG tablet 2 tab on day 1 then 1 tab daily until complete Patient taking differently: Take 250 mg by mouth daily.  01/29/19  Yes Causey, Charlestine Massed, NP  benzonatate (TESSALON) 200 MG capsule TAKE 1 CAPSULE BY MOUTH 3 TIMES DAILY IF NEEDED Patient taking differently: Take 200 mg by mouth 3 (three) times daily as needed for cough.  12/23/18  Yes [name], Virgie Dad, MD  calcium-vitamin D (OSCAL WITH D) 500-200 MG-UNIT tablet Take 1 tablet by mouth 3 (three) times daily. 11/30/18  Yes Rai, Ripudeep K, MD  carvedilol (COREG) 3.125 MG tablet Take 1 tablet (3.125 mg total) by mouth 2 (two) times daily with a meal. 01/24/19 02/23/19 Yes Donne Hazel, MD  furosemide (LASIX) 20 MG tablet Take 2 tablets (40 mg total) by mouth 2 (two) times daily. 12/09/18  Yes Causey, Charlestine Massed, NP  loratadine (CLARITIN) 10 MG tablet Take 1 tablet (10 mg total) by mouth daily. 11/29/18  Yes Rai, Ripudeep K, MD  nystatin (MYCOSTATIN/NYSTOP) powder Apply topically 3 (three) times daily. Apply under breast area 11/28/18  Yes Rai, Ripudeep K, MD  oxyCODONE (OXY IR/ROXICODONE) 5 MG immediate release tablet Take 0.5 tablets (2.5 mg total) by mouth every 4 (four) hours as needed for moderate pain or severe pain. 01/24/19  Yes Donne Hazel, MD  palbociclib [facility]) 100 MG capsule Take 1 capsule (100 mg total) by mouth daily with breakfast. Take whole with food. Take for 21 days on, 7 days off, repeat every 28 days. Patient taking differently: Take 100 mg by mouth daily with breakfast. Take for 21 days on, 7 days off, repeat every 28 days. 12/22/18  Yes [name], Virgie Dad, MD  polyethylene glycol (MIRALAX / GLYCOLAX) 17 g  packet Take 17 g by mouth as needed for constipation.   Yes [provider]    Physical Exam: Vitals:   02/03/19 1900 02/03/19 1915 02/03/19 1930 02/03/19 1945  BP: 120/75 133/73 126/76 (!) 121/59  Pulse: 100 100 100 99  Resp: 20 20 15 20   Temp:      SpO2: 99% 98% 100% 100%    Constitutional: NAD, in apparent discomfort  Eyes: PERTLA, lids and conjunctivae normal ENMT: Mucous membranes are moist. Posterior pharynx clear of any exudate or lesions.   Neck: normal, supple, no masses, no thyromegaly Respiratory: clear to auscultation bilaterally, no wheezing, no crackles. No accessory muscle use.  Cardiovascular: S1 & S2 heard, regular rate and rhythm. No extremity edema.  Abdomen: No distension,  no tenderness, soft. Bowel sounds active.  Musculoskeletal: no clubbing / cyanosis. Midline tenderness in mid back.    Skin: Erythema and induration involving chest. Warm, dry, well-perfused. Neurologic: CN 2-12 grossly intact. Sensation intact. Moving all extremities.  Psychiatric: Alert and oriented x 3. Very pleasant, cooperative.    Labs on Admission: I have personally reviewed following labs and imaging studies  CBC: Recent Labs  Lab 01/29/19 1400 02/03/19 1621  WBC 10.6* 4.9  NEUTROABS 8.7* 3.2  HGB 9.8* 9.8*  HCT 32.3* 33.1*  MCV 105.6* 106.1*  PLT 509* [id number]*   Basic Metabolic Panel: Recent Labs  Lab 01/29/19 1400 02/03/19 1621  NA 141 141  K 3.2* 2.7*  CL 94* 91*  CO2 35* 35*  GLUCOSE 140* 122*  BUN 17 12  CREATININE 0.75 0.83  CALCIUM 9.4 8.9   GFR: Estimated Creatinine Clearance: 73.5 mL/min (by C-G formula based on SCr of 0.83 mg/dL). Liver Function Tests: Recent Labs  Lab 01/29/19 1400  AST 14*  ALT 7  ALKPHOS 245*  BILITOT 0.6  PROT 7.4  ALBUMIN 2.8*   No results for input(s): LIPASE, AMYLASE in the last 168 hours. No results for input(s): AMMONIA in the last 168 hours. Coagulation Profile: No results for input(s): INR, PROTIME in the  last 168 hours. Cardiac Enzymes: No results for input(s): CKTOTAL, CKMB, CKMBINDEX, TROPONINI in the last 168 hours. BNP (last 3 results) No results for input(s): PROBNP in the last 8760 hours. HbA1C: No results for input(s): HGBA1C in the last 72 hours. CBG: No results for input(s): GLUCAP in the last 168 hours. Lipid Profile: No results for input(s): CHOL, HDL, LDLCALC, TRIG, CHOLHDL, LDLDIRECT in the last 72 hours. Thyroid Function Tests: No results for input(s): TSH, T4TOTAL, FREET4, T3FREE, THYROIDAB in the last 72 hours. Anemia Panel: No results for input(s): VITAMINB12, FOLATE, FERRITIN, TIBC, IRON, RETICCTPCT in the last 72 hours. Urine analysis:    Component Value Date/Time   COLORURINE STRAW (A) 01/15/2019 1150   APPEARANCEUR CLEAR 01/15/2019 1150   LABSPEC 1.008 01/15/2019 1150   PHURINE 6.0 01/15/2019 1150   GLUCOSEU NEGATIVE 01/15/2019 1150   HGBUR NEGATIVE 01/15/2019 [place] 01/15/2019 1150   KETONESUR NEGATIVE 01/15/2019 1150   PROTEINUR NEGATIVE 01/15/2019 1150   NITRITE NEGATIVE 01/15/2019 1150   LEUKOCYTESUR TRACE (A) 01/15/2019 1150   Sepsis Labs: @LABRCNTIP (procalcitonin:4,lacticidven:4) )No results found for this or any previous visit (from the past 240 hour(s)).   Radiological Exams on Admission: Ct Thoracic Spine Wo Contrast  Result Date: 02/03/2019 CLINICAL DATA:  Mid back pain/T-spine pain, initial exam. EXAM: CT THORACIC SPINE WITHOUT CONTRAST TECHNIQUE: Multidetector CT images of the thoracic were obtained using the standard protocol without intravenous contrast. COMPARISON:  CT angiography chest 12/25/2018 FINDINGS: Alignment: Focal kyphosis centered at the T11-T12 level. Trace T12 bony retropulsion. Vertebrae: Redemonstrated marked diffuse osseous heterogeneity with multifocal sclerosis and lytic changes. Findings are consistent with diffuse osseous metastatic disease. New from prior chest CT 12/25/2018, there is a T12 anterior wedge  compression deformity with approximately 50% loss of vertebral body height and trace bony retropulsion. Mild loss of T11 vertebral body height is also questioned, and this was not definitively present on prior examination. Widening of the T10-T11 and T11-T12 interspinous spaces appear similar to prior exam. Trace T12 bony retropulsion and T11-T12 gibbus deformity results in mild to moderate bony narrowing of the spinal canal at the T11-T12 level. At the remaining levels, no high-grade bony spinal canal stenosis or neural  foraminal narrowing. Vertebral body height is otherwise maintained. No other definite acute fracture to the thoracic spine is identified. Redemonstrated are multiple bilateral subacute pathologic rib fractures. Paraspinal and other soft tissues: Soft tissue prominence surrounding the T12 vertebra, which may reflect extra ertebral extension of metastatic disease. Small bilateral pleural effusion are similar to prior examination. Redemonstrated bilateral bandlike opacities which may reflect scar or fibrosis. Hiatal hernia containing what appears to be enteric contrast. Cholecystolithiasis Disc levels: Multilevel loss of intervertebral disc height. Thoracic spondylosis. Apart from mild to moderate narrowing at the T11-T12 level related to focal gibbus deformity and trace T12 bony retropulsion, no high-grade bony spinal canal stenosis or neural foraminal narrowing at the remaining levels. IMPRESSION: Redemonstrated diffuse osseous metastatic disease. New from prior chest CT 12/25/2018, there is an acute/subacute T12 compression deformity with approximately 50% loss of vertebral body height and trace bony retropulsion. Mild loss of T11 vertebral body height is also questioned, and new from prior examination. Resultant focal kyphosis at the T11-T12 level with mild to moderate spinal canal narrowing. Widening of the T10-T11 and T12-L1 interspinous spaces appears similar to prior examination, but MRI may be  obtained for further evaluation to definitively exclude ligamentous injury. Soft tissue prominence surrounding the T12 vertebra which may reflect extra vertebral extension of metastatic disease. Consider contrast-enhanced MRI for further evaluation, as clinically warranted. No other definite acute fracture to the thoracic spine. Redemonstrated are multiple nonacute bilateral pathologic rib fractures. Small bilateral pleural effusions. Hiatal hernia Cholecystolithiasis. Electronically Signed   By: Kellie Simmering   On: 02/03/2019 18:13   Ct Lumbar Spine Wo Contrast  Result Date: 02/03/2019 CLINICAL DATA:  Back pain, osteoporosis and chronic steroid use. EXAM: CT LUMBAR SPINE WITHOUT CONTRAST TECHNIQUE: Multidetector CT imaging of the lumbar spine was performed without intravenous contrast administration. Multiplanar CT image reconstructions were also generated. COMPARISON:  CT angiography chest 12/25/2018, FINDINGS: Segmentation: For the purposes of this examination, 5 lumbar vertebrae are assumed and the caudal most well-formed intervertebral disc is designated L5-S1. Alignment: Trace L1-L2 and L2-L3 grade 1 retrolisthesis. Vertebrae: There is marked diffuse osseous heterogeneity with multifocal sclerotic and lytic changes. Findings are consistent with diffuse osseous metastatic disease. No lumbar compression deformity. Subacute appearing fracture of the right L1 transverse process. No other definite fracture of the lumbar spine is identified. Subacute appearing fracture of the posterior left twelfth rib. Paraspinal and other soft tissues: The imaged paraspinal soft tissues are unremarkable. Aortoiliac atherosclerotic disease. Disc levels: Moderate L1-L2 disc degeneration. Intervertebral disc height is otherwise preserved within the lumbar spine. Lumbar spondylosis without high-grade bony spinal canal or neural foraminal stenosis. IMPRESSION: Findings consistent with diffuse osseous metastatic disease. No lumbar  compression deformity. Subacute appearing fracture of the right L1 transverse process. No other definite fracture of the lumbar spine is identified. Subacute appearing fracture of the posterior left twelfth rib. Electronically Signed   By: Kellie Simmering   On: 02/03/2019 17:48    EKG: Independently reviewed. Sinus tachycardia, rate 100, normal QT interval.   Assessment/Plan   1. Intractable back pain; diffuse osseous metastatic disease  - Presents with several days of severe back pain despite using oxycodone at home, denies inciting event, denies weakness/paresthesia/incontinence, and is found to have diffuse osseous metastatic disease on CT with subacute or acute T12 compression deformity   - She was treated in ED with IV analgesia but continues to have severe pain that prevents her from getting out of bed  - Neurosurgery was consulted by ED  physician  - Continue IV Dilaudid for now, transition to oral agents as she improves, start Decadron and gabapentin, start lidocaine patch, bowel regimen    2. Metastatic breast cancer  - Presented in May 2020 with diffuse metastases at that time  - She is being treated with anastrozole, palbociclib, and Xgeva   3. Hypokalemia  - Serum potassium is 2.7 in ED   - Treated in ED with 40 mEq oral and 10 mEq IV potassium, also given empiric mag  - Check EKG, repeat chem panel in am    4. Hypertension  - BP at goal, continue Coreg    5. Chronic hypoxic respiratory failure  - Attributed to malignant pleural effusions and stable on 2 Lpm with no acute respiratory complaints   6. Macrocytic anemia - Hgb is 9.8 on admission  - Appears to be stable with no bleeding, likely secondary to chemotherapy    7. Debility  - SNF was recommended at time of recent hospital discharge but no beds were available and patient went home with home health  - Unfortunately, her condition has continued to decline and patient does not feel that her daughter will be able to  provide all the assistance she now requires despite her best efforts   - PT and social work consultation requested     PPE: Mask, face shield  DVT prophylaxis: Lovenox  Code Status: Full  Family Communication: Discussed with patient  Consults called: Neurosurgery consulted by ED physician  Admission status: Observation     Vianne Bulls, MD [facility] Pager [phone]  If 7PM-7AM, please contact night-coverage [email or web address] Password [facility]  02/03/2019, 7:51 PM

## 2019-02-03 NOTE — ED Notes (Addendum)
Date and time results received: 02/03/19 5:31 PM   Test: Potassium Critical Value: 2.7  Name of Provider Notified: Delo  Orders Received? Or Actions Taken?: Awaiting further orders

## 2019-02-03 NOTE — Telephone Encounter (Signed)
Contact patient to verify telephone visit for pre reg °

## 2019-02-03 NOTE — ED Notes (Signed)
Patient transported to CT 

## 2019-02-03 NOTE — ED Notes (Signed)
Purewick placed on patient.

## 2019-02-03 NOTE — ED Notes (Signed)
ED TO INPATIENT HANDOFF REPORT  Name/Age/Gender Suzanne [name] 58 y.o. female  Code Status Code Status History    Date Active Date Inactive Code Status Order ID Comments User Context   12/25/2018 1619 01/24/2019 1756 Full Code [id number]  Elmarie Shiley, MD ED   11/16/2018 2008 12/01/2018 1733 Full Code [id number]  Elsie Lincoln, MD Inpatient   11/16/2018 1916 11/16/2018 2008 DNR [id number]  Laurin Coder, MD Inpatient   11/14/2018 2104 11/16/2018 1916 Full Code [id number]  Hugelmeyer, [place], DO Inpatient   10/26/2018 2342 10/30/2018 2112 Full Code [id number]  Danna Hefty, DO ED   Advance Care Planning Activity      Home/SNF/Other Home  Chief Complaint Back Pain  Level of Care/Admitting Diagnosis ED Disposition    ED Disposition Condition B and E Hospital Area: [place] [100102]  Level of Care: Med-Surg [16]  Covid Evaluation: Asymptomatic Screening Protocol (No Symptoms)  Diagnosis: Intractable back pain [720110]  Admitting Physician: Vianne Bulls [id number]  Attending Physician: Vianne Bulls [id number]  PT Class (Do Not Modify): Observation [104]  PT Acc Code (Do Not Modify): Observation [10022]       Medical History Past Medical History:  Diagnosis Date  . Cancer ([place])   . Family history of breast cancer   . Family history of colon cancer   . Family history of melanoma   . Family history of prostate cancer   . Gallstones   . Glaucoma   . Mastitis    reports history of recurrent mastitis  . Metastatic breast cancer ([place])   . Obesity     Allergies No Known Allergies  IV Location/Drains/Wounds Patient Lines/Drains/Airways Status   Active Line/Drains/Airways    Name:   Placement date:   Placement time:   Site:   Days:   Peripheral IV 02/03/19 Right Antecubital   02/03/19    -    Antecubital   less than 1   External Urinary Catheter   01/14/19    0612    -   20   Incision (Closed) 01/09/19 Shoulder Left   01/09/19     1958     25   Pressure Injury 12/25/18 Sacrum Mid Deep Tissue Injury - Purple or maroon localized area of discolored intact skin or blood-filled blister due to damage of underlying soft tissue from pressure and/or shear. evolving into stage 2 in patchy areas when asse   12/25/18    1700     40          Labs/Imaging Results for orders placed or performed during the hospital encounter of 02/03/19 (from the past 48 hour(s))  Basic metabolic panel     Status: Abnormal   Collection Time: 02/03/19  4:21 PM  Result Value Ref Range   Sodium 141 135 - 145 mmol/L   Potassium 2.7 (LL) 3.5 - 5.1 mmol/L    Comment: CRITICAL RESULT CALLED TO, READ BACK BY AND VERIFIED WITH: S.Lateka Rady AT 1730 ON 02/03/19 BY N.THOMPSON    Chloride 91 (L) 98 - 111 mmol/L   CO2 35 (H) 22 - 32 mmol/L   Glucose, Bld 122 (H) 70 - 99 mg/dL   BUN 12 6 - 20 mg/dL   Creatinine, Ser 0.83 0.44 - 1.00 mg/dL   Calcium 8.9 8.9 - 10.3 mg/dL   GFR calc non Af Amer >60 >60 mL/min   GFR calc Af Amer >60 >60 mL/min   Anion gap 15 5 - 15  Comment: Performed at [facility], [place] [address], [place], [place] [address]  CBC with Differential     Status: Abnormal   Collection Time: 02/03/19  4:21 PM  Result Value Ref Range   WBC 4.9 4.0 - 10.5 K/uL   RBC 3.12 (L) 3.87 - 5.11 MIL/uL   Hemoglobin 9.8 (L) 12.0 - 15.0 g/dL   HCT 33.1 (L) 36.0 - 46.0 %   MCV 106.1 (H) 80.0 - 100.0 fL   MCH 31.4 26.0 - 34.0 pg   MCHC 29.6 (L) 30.0 - 36.0 g/dL   RDW 19.1 (H) 11.5 - 15.5 %   Platelets 456 (H) 150 - 400 K/uL   nRBC 0.0 0.0 - 0.2 %   Neutrophils Relative % 66 %   Neutro Abs 3.2 1.7 - 7.7 K/uL   Lymphocytes Relative 26 %   Lymphs Abs 1.3 0.7 - 4.0 K/uL   Monocytes Relative 4 %   Monocytes Absolute 0.2 0.1 - 1.0 K/uL   Eosinophils Relative 2 %   Eosinophils Absolute 0.1 0.0 - 0.5 K/uL   Basophils Relative 1 %   Basophils Absolute 0.0 0.0 - 0.1 K/uL   Immature Granulocytes 1 %   Abs Immature Granulocytes 0.03 0.00 -  0.07 K/uL    Comment: Performed at [facility], [place] [address], [place], [place] [address]  SARS Coronavirus 2 [facility] order, Performed in [facility] hospital lab) Nasopharyngeal Nasopharyngeal Swab     Status: None   Collection Time: 02/03/19  7:22 PM   Specimen: Nasopharyngeal Swab  Result Value Ref Range   SARS Coronavirus 2 NEGATIVE NEGATIVE    Comment: (NOTE) If result is NEGATIVE SARS-CoV-2 target nucleic acids are NOT DETECTED. The SARS-CoV-2 RNA is generally detectable in upper and lower  respiratory specimens during the acute phase of infection. The lowest  concentration of SARS-CoV-2 viral copies this assay can detect is 250  copies / mL. A negative result does not preclude SARS-CoV-2 infection  and should not be used as the sole basis for treatment or other  patient management decisions.  A negative result may occur with  improper specimen collection / handling, submission of specimen other  than nasopharyngeal swab, presence of viral mutation(s) within the  areas targeted by this assay, and inadequate number of viral copies  (<250 copies / mL). A negative result must be combined with clinical  observations, patient history, and epidemiological information. If result is POSITIVE SARS-CoV-2 target nucleic acids are DETECTED. The SARS-CoV-2 RNA is generally detectable in upper and lower  respiratory specimens dur ing the acute phase of infection.  Positive  results are indicative of active infection with SARS-CoV-2.  Clinical  correlation with patient history and other diagnostic information is  necessary to determine patient infection status.  Positive results do  not rule out bacterial infection or co-infection with other viruses. If result is PRESUMPTIVE POSTIVE SARS-CoV-2 nucleic acids MAY BE PRESENT.   A presumptive positive result was obtained on the submitted specimen  and confirmed on repeat testing.  While 2019 novel coronavirus  (SARS-CoV-2)  nucleic acids may be present in the submitted sample  additional confirmatory testing may be necessary for epidemiological  and / or clinical management purposes  to differentiate between  SARS-CoV-2 and other Sarbecovirus currently known to infect humans.  If clinically indicated additional testing with an alternate test  methodology [phone]) is advised. The SARS-CoV-2 RNA is generally  detectable in upper and lower respiratory sp ecimens during the  acute  phase of infection. The expected result is Negative. Fact Sheet for Patients:  StrictlyIdeas.no Fact Sheet for Healthcare Providers: BankingDealers.co.za This test is not yet approved or cleared by the [place] FDA and has been authorized for detection and/or diagnosis of SARS-CoV-2 by FDA under an Emergency Use Authorization (EUA).  This EUA will remain in effect (meaning this test can be used) for the duration of the COVID-19 declaration under Section 564(b)(1) of the Act, 21 U.S.C. section 360bbb-3(b)(1), unless the authorization is terminated or revoked sooner. Performed at [facility], [place] [address], [place], [place] [address]    Ct Thoracic Spine Wo Contrast  Result Date: 02/03/2019 CLINICAL DATA:  Mid back pain/T-spine pain, initial exam. EXAM: CT THORACIC SPINE WITHOUT CONTRAST TECHNIQUE: Multidetector CT images of the thoracic were obtained using the standard protocol without intravenous contrast. COMPARISON:  CT angiography chest 12/25/2018 FINDINGS: Alignment: Focal kyphosis centered at the T11-T12 level. Trace T12 bony retropulsion. Vertebrae: Redemonstrated marked diffuse osseous heterogeneity with multifocal sclerosis and lytic changes. Findings are consistent with diffuse osseous metastatic disease. New from prior chest CT 12/25/2018, there is a T12 anterior wedge compression deformity with approximately 50% loss of vertebral body height and trace bony  retropulsion. Mild loss of T11 vertebral body height is also questioned, and this was not definitively present on prior examination. Widening of the T10-T11 and T11-T12 interspinous spaces appear similar to prior exam. Trace T12 bony retropulsion and T11-T12 gibbus deformity results in mild to moderate bony narrowing of the spinal canal at the T11-T12 level. At the remaining levels, no high-grade bony spinal canal stenosis or neural foraminal narrowing. Vertebral body height is otherwise maintained. No other definite acute fracture to the thoracic spine is identified. Redemonstrated are multiple bilateral subacute pathologic rib fractures. Paraspinal and other soft tissues: Soft tissue prominence surrounding the T12 vertebra, which may reflect extra ertebral extension of metastatic disease. Small bilateral pleural effusion are similar to prior examination. Redemonstrated bilateral bandlike opacities which may reflect scar or fibrosis. Hiatal hernia containing what appears to be enteric contrast. Cholecystolithiasis Disc levels: Multilevel loss of intervertebral disc height. Thoracic spondylosis. Apart from mild to moderate narrowing at the T11-T12 level related to focal gibbus deformity and trace T12 bony retropulsion, no high-grade bony spinal canal stenosis or neural foraminal narrowing at the remaining levels. IMPRESSION: Redemonstrated diffuse osseous metastatic disease. New from prior chest CT 12/25/2018, there is an acute/subacute T12 compression deformity with approximately 50% loss of vertebral body height and trace bony retropulsion. Mild loss of T11 vertebral body height is also questioned, and new from prior examination. Resultant focal kyphosis at the T11-T12 level with mild to moderate spinal canal narrowing. Widening of the T10-T11 and T12-L1 interspinous spaces appears similar to prior examination, but MRI may be obtained for further evaluation to definitively exclude ligamentous injury. Soft tissue  prominence surrounding the T12 vertebra which may reflect extra vertebral extension of metastatic disease. Consider contrast-enhanced MRI for further evaluation, as clinically warranted. No other definite acute fracture to the thoracic spine. Redemonstrated are multiple nonacute bilateral pathologic rib fractures. Small bilateral pleural effusions. Hiatal hernia Cholecystolithiasis. Electronically Signed   By: Kellie Simmering   On: 02/03/2019 18:13   Ct Lumbar Spine Wo Contrast  Result Date: 02/03/2019 CLINICAL DATA:  Back pain, osteoporosis and chronic steroid use. EXAM: CT LUMBAR SPINE WITHOUT CONTRAST TECHNIQUE: Multidetector CT imaging of the lumbar spine was performed without intravenous contrast administration. Multiplanar CT image reconstructions were also generated. COMPARISON:  CT angiography chest 12/25/2018, FINDINGS: Segmentation: For the purposes of this examination, 5 lumbar vertebrae are assumed and the caudal most well-formed intervertebral disc is designated L5-S1. Alignment: Trace L1-L2 and L2-L3 grade 1 retrolisthesis. Vertebrae: There is marked diffuse osseous heterogeneity with multifocal sclerotic and lytic changes. Findings are consistent with diffuse osseous metastatic disease. No lumbar compression deformity. Subacute appearing fracture of the right L1 transverse process. No other definite fracture of the lumbar spine is identified. Subacute appearing fracture of the posterior left twelfth rib. Paraspinal and other soft tissues: The imaged paraspinal soft tissues are unremarkable. Aortoiliac atherosclerotic disease. Disc levels: Moderate L1-L2 disc degeneration. Intervertebral disc height is otherwise preserved within the lumbar spine. Lumbar spondylosis without high-grade bony spinal canal or neural foraminal stenosis. IMPRESSION: Findings consistent with diffuse osseous metastatic disease. No lumbar compression deformity. Subacute appearing fracture of the right L1 transverse process. No  other definite fracture of the lumbar spine is identified. Subacute appearing fracture of the posterior left twelfth rib. Electronically Signed   By: Kellie Simmering   On: 02/03/2019 17:48    Pending Labs Unresulted Labs (From admission, onward)    Start     Ordered   Signed and Held  Creatinine, serum  (enoxaparin (LOVENOX)    CrCl >/= 30 ml/min)  Weekly,   R    Comments: while on enoxaparin therapy    Signed and Held   Signed and Held  Basic metabolic panel  Tomorrow morning,   R     Signed and Held   Signed and Held  Magnesium  Tomorrow morning,   R     Signed and Held   Signed and Held  CBC WITH DIFFERENTIAL  Tomorrow morning,   R     Signed and Held          Vitals/Pain Today's Vitals   02/03/19 2130 02/03/19 2145 02/03/19 2200 02/03/19 2215  BP: 124/80 122/74 122/74 128/75  Pulse: 100 99 100 (!) 101  Resp: 20 17 20 19   Temp:      SpO2: 98% 98% 97% 98%  PainSc:        Isolation Precautions No active isolations  Medications Medications  azithromycin (ZITHROMAX) tablet 500 mg (has no administration in time range)  ALPRAZolam (XANAX) tablet 0.25-0.5 mg (has no administration in time range)  benzonatate (TESSALON) capsule 200 mg (has no administration in time range)  acetaminophen (TYLENOL) tablet 650 mg (has no administration in time range)    Or  acetaminophen (TYLENOL) suppository 650 mg (has no administration in time range)  ondansetron (ZOFRAN) tablet 4 mg (has no administration in time range)    Or  ondansetron (ZOFRAN) injection 4 mg (has no administration in time range)  HYDROmorphone (DILAUDID) injection 1 mg (has no administration in time range)  magnesium sulfate IVPB 2 g 50 mL (has no administration in time range)  oxyCODONE (Oxy IR/ROXICODONE) immediate release tablet 5 mg (has no administration in time range)  dexamethasone (DECADRON) injection 2 mg (has no administration in time range)  dexamethasone (DECADRON) injection 10 mg (has no administration in  time range)  gabapentin (NEURONTIN) capsule 100 mg (has no administration in time range)  lidocaine (LIDODERM) 5 % 1 patch (has no administration in time range)  azithromycin (ZITHROMAX) tablet 250 mg (has no administration in time range)  HYDROmorphone (DILAUDID) injection 1 mg (1 mg Intravenous Given 02/03/19 1651)  potassium chloride 10 mEq in 100 mL IVPB (0 mEq Intravenous Stopped 02/03/19 2037)  potassium chloride SA (  K-DUR) CR tablet 40 mEq (40 mEq Oral Given 02/03/19 1925)    Mobility walks

## 2019-02-03 NOTE — Telephone Encounter (Signed)
Received call from pt's dtr, Suzanne [name]. Suzanne [name] had called on call service x 2 over the weekend reporting pt's pain and no BM.  Suzanne [name] was advised to take mom to the ED initially and says she was then told to call back Monday morning and bring mom in to the clinic. Suzanne [name] called at approx 1330 today to say she would like to bring her mom in to be seen but her back pain is so severe the pt cannot sit in the car to get here.  Also added to the symptoms is pt is currently having cold chills. RN advised Suzanne [name] it would be best to call EMS and have mother taken to the ED to be evaluated if she was unable to sit in the car to come in to the clinic. Suzanne [name] verbalizes understanding of instructions and says she will call us back once she knows what will be done for her mother in the ED.

## 2019-02-04 ENCOUNTER — Inpatient Hospital Stay (HOSPITAL_COMMUNITY): Payer: Medicaid Other

## 2019-02-04 ENCOUNTER — Telehealth: Payer: Self-pay

## 2019-02-04 ENCOUNTER — Other Ambulatory Visit: Payer: Self-pay

## 2019-02-04 ENCOUNTER — Inpatient Hospital Stay: Payer: Medicaid Other

## 2019-02-04 DIAGNOSIS — M549 Dorsalgia, unspecified: Secondary | ICD-10-CM

## 2019-02-04 DIAGNOSIS — S22081A Stable burst fracture of T11-T12 vertebra, initial encounter for closed fracture: Secondary | ICD-10-CM

## 2019-02-04 DIAGNOSIS — M545 Low back pain: Secondary | ICD-10-CM | POA: Diagnosis not present

## 2019-02-04 DIAGNOSIS — Z170 Estrogen receptor positive status [ER+]: Secondary | ICD-10-CM | POA: Diagnosis not present

## 2019-02-04 DIAGNOSIS — M8458XA Pathological fracture in neoplastic disease, other specified site, initial encounter for fracture: Secondary | ICD-10-CM | POA: Diagnosis not present

## 2019-02-04 DIAGNOSIS — K5900 Constipation, unspecified: Secondary | ICD-10-CM | POA: Diagnosis present

## 2019-02-04 DIAGNOSIS — Z20828 Contact with and (suspected) exposure to other viral communicable diseases: Secondary | ICD-10-CM | POA: Diagnosis present

## 2019-02-04 DIAGNOSIS — Z03818 Encounter for observation for suspected exposure to other biological agents ruled out: Secondary | ICD-10-CM | POA: Diagnosis not present

## 2019-02-04 DIAGNOSIS — R5381 Other malaise: Secondary | ICD-10-CM | POA: Diagnosis not present

## 2019-02-04 DIAGNOSIS — I10 Essential (primary) hypertension: Secondary | ICD-10-CM | POA: Diagnosis not present

## 2019-02-04 DIAGNOSIS — Z808 Family history of malignant neoplasm of other organs or systems: Secondary | ICD-10-CM | POA: Diagnosis not present

## 2019-02-04 DIAGNOSIS — Z7401 Bed confinement status: Secondary | ICD-10-CM | POA: Diagnosis not present

## 2019-02-04 DIAGNOSIS — M5136 Other intervertebral disc degeneration, lumbar region: Secondary | ICD-10-CM | POA: Diagnosis not present

## 2019-02-04 DIAGNOSIS — J9612 Chronic respiratory failure with hypercapnia: Secondary | ICD-10-CM | POA: Diagnosis not present

## 2019-02-04 DIAGNOSIS — E876 Hypokalemia: Secondary | ICD-10-CM | POA: Diagnosis present

## 2019-02-04 DIAGNOSIS — C50919 Malignant neoplasm of unspecified site of unspecified female breast: Secondary | ICD-10-CM | POA: Diagnosis not present

## 2019-02-04 DIAGNOSIS — Z8249 Family history of ischemic heart disease and other diseases of the circulatory system: Secondary | ICD-10-CM | POA: Diagnosis not present

## 2019-02-04 DIAGNOSIS — Z79891 Long term (current) use of opiate analgesic: Secondary | ICD-10-CM | POA: Diagnosis not present

## 2019-02-04 DIAGNOSIS — H409 Unspecified glaucoma: Secondary | ICD-10-CM | POA: Diagnosis present

## 2019-02-04 DIAGNOSIS — C50811 Malignant neoplasm of overlapping sites of right female breast: Secondary | ICD-10-CM | POA: Diagnosis present

## 2019-02-04 DIAGNOSIS — M2550 Pain in unspecified joint: Secondary | ICD-10-CM | POA: Diagnosis not present

## 2019-02-04 DIAGNOSIS — J9611 Chronic respiratory failure with hypoxia: Secondary | ICD-10-CM | POA: Diagnosis not present

## 2019-02-04 DIAGNOSIS — Z8042 Family history of malignant neoplasm of prostate: Secondary | ICD-10-CM | POA: Diagnosis not present

## 2019-02-04 DIAGNOSIS — C50911 Malignant neoplasm of unspecified site of right female breast: Secondary | ICD-10-CM | POA: Diagnosis not present

## 2019-02-04 DIAGNOSIS — G893 Neoplasm related pain (acute) (chronic): Secondary | ICD-10-CM | POA: Diagnosis present

## 2019-02-04 DIAGNOSIS — D630 Anemia in neoplastic disease: Secondary | ICD-10-CM | POA: Diagnosis present

## 2019-02-04 DIAGNOSIS — S22080A Wedge compression fracture of T11-T12 vertebra, initial encounter for closed fracture: Secondary | ICD-10-CM | POA: Diagnosis not present

## 2019-02-04 DIAGNOSIS — D539 Nutritional anemia, unspecified: Secondary | ICD-10-CM | POA: Diagnosis present

## 2019-02-04 DIAGNOSIS — M47814 Spondylosis without myelopathy or radiculopathy, thoracic region: Secondary | ICD-10-CM | POA: Diagnosis not present

## 2019-02-04 DIAGNOSIS — Z6829 Body mass index (BMI) 29.0-29.9, adult: Secondary | ICD-10-CM | POA: Diagnosis not present

## 2019-02-04 DIAGNOSIS — D6481 Anemia due to antineoplastic chemotherapy: Secondary | ICD-10-CM | POA: Diagnosis present

## 2019-02-04 DIAGNOSIS — C7951 Secondary malignant neoplasm of bone: Secondary | ICD-10-CM | POA: Diagnosis not present

## 2019-02-04 DIAGNOSIS — Z803 Family history of malignant neoplasm of breast: Secondary | ICD-10-CM | POA: Diagnosis not present

## 2019-02-04 DIAGNOSIS — M4804 Spinal stenosis, thoracic region: Secondary | ICD-10-CM | POA: Diagnosis not present

## 2019-02-04 DIAGNOSIS — J910 Malignant pleural effusion: Secondary | ICD-10-CM | POA: Diagnosis present

## 2019-02-04 DIAGNOSIS — Z800 Family history of malignant neoplasm of digestive organs: Secondary | ICD-10-CM | POA: Diagnosis not present

## 2019-02-04 DIAGNOSIS — M5134 Other intervertebral disc degeneration, thoracic region: Secondary | ICD-10-CM | POA: Diagnosis not present

## 2019-02-04 DIAGNOSIS — Z79811 Long term (current) use of aromatase inhibitors: Secondary | ICD-10-CM | POA: Diagnosis not present

## 2019-02-04 LAB — MAGNESIUM: Magnesium: 2.8 mg/dL — ABNORMAL HIGH (ref 1.7–2.4)

## 2019-02-04 LAB — BASIC METABOLIC PANEL
Anion gap: 14 (ref 5–15)
BUN: 15 mg/dL (ref 6–20)
CO2: 33 mmol/L — ABNORMAL HIGH (ref 22–32)
Calcium: 8.9 mg/dL (ref 8.9–10.3)
Chloride: 92 mmol/L — ABNORMAL LOW (ref 98–111)
Creatinine, Ser: 0.81 mg/dL (ref 0.44–1.00)
GFR calc Af Amer: >60 mL/min (ref >60)
GFR calc non Af Amer: >60 mL/min (ref >60)
Glucose, Bld: 127 mg/dL — ABNORMAL HIGH (ref 70–99)
Potassium: 2.9 mmol/L — ABNORMAL LOW (ref 3.5–5.1)
Sodium: 139 mmol/L (ref 135–145)

## 2019-02-04 LAB — CBC WITH DIFFERENTIAL/PLATELET
Abs Immature Granulocytes: 0.02 10*3/uL (ref 0.00–0.07)
Basophils Absolute: 0 10*3/uL (ref 0.0–0.1)
Basophils Relative: 1 %
Eosinophils Absolute: 0.1 10*3/uL (ref 0.0–0.5)
Eosinophils Relative: 1 %
HCT: 32 % — ABNORMAL LOW (ref 36.0–46.0)
Hemoglobin: 9.5 g/dL — ABNORMAL LOW (ref 12.0–15.0)
Immature Granulocytes: 1 %
Lymphocytes Relative: 21 %
Lymphs Abs: 0.9 10*3/uL (ref 0.7–4.0)
MCH: 32.2 pg (ref 26.0–34.0)
MCHC: 29.7 g/dL — ABNORMAL LOW (ref 30.0–36.0)
MCV: 108.5 fL — ABNORMAL HIGH (ref 80.0–100.0)
Monocytes Absolute: 0.2 10*3/uL (ref 0.1–1.0)
Monocytes Relative: 4 %
Neutro Abs: 3.1 10*3/uL (ref 1.7–7.7)
Neutrophils Relative %: 72 %
Platelets: 408 10*3/uL — ABNORMAL HIGH (ref 150–400)
RBC: 2.95 MIL/uL — ABNORMAL LOW (ref 3.87–5.11)
RDW: 19.1 % — ABNORMAL HIGH (ref 11.5–15.5)
WBC: 4.2 10*3/uL (ref 4.0–10.5)
nRBC: 0 % (ref 0.0–0.2)

## 2019-02-04 LAB — GLUCOSE, CAPILLARY: Glucose-Capillary: 127 mg/dL — ABNORMAL HIGH (ref 70–99)

## 2019-02-04 MED ORDER — POTASSIUM CHLORIDE CRYS ER 20 MEQ PO TBCR
40.0000 meq | EXTENDED_RELEASE_TABLET | Freq: Three times a day (TID) | ORAL | Status: AC
  Administered 2019-02-04 (×2): 40 meq via ORAL
  Filled 2019-02-04 (×2): qty 2

## 2019-02-04 MED ORDER — ENSURE ENLIVE PO LIQD
237.0000 mL | Freq: Two times a day (BID) | ORAL | Status: DC
  Administered 2019-02-04 – 2019-02-07 (×6): 237 mL via ORAL

## 2019-02-04 MED ORDER — POTASSIUM CHLORIDE 10 MEQ/100ML IV SOLN
10.0000 meq | Freq: Once | INTRAVENOUS | Status: AC
  Administered 2019-02-04: 10 meq via INTRAVENOUS
  Filled 2019-02-04: qty 100

## 2019-02-04 MED ORDER — ADULT MULTIVITAMIN W/MINERALS CH
1.0000 | ORAL_TABLET | Freq: Every day | ORAL | Status: DC
  Administered 2019-02-04 – 2019-02-07 (×4): 1 via ORAL
  Filled 2019-02-04 (×4): qty 1

## 2019-02-04 MED ORDER — ALBUTEROL SULFATE (2.5 MG/3ML) 0.083% IN NEBU: 2.5000 mg | INHALATION_SOLUTION | Freq: Four times a day (QID) | RESPIRATORY_TRACT | Status: DC | PRN

## 2019-02-04 MED ORDER — PRO-STAT SUGAR FREE PO LIQD
30.0000 mL | Freq: Three times a day (TID) | ORAL | Status: DC
  Administered 2019-02-04 – 2019-02-06 (×3): 30 mL via ORAL
  Filled 2019-02-04 (×5): qty 30

## 2019-02-04 NOTE — Progress Notes (Signed)
Initial Nutrition Assessment  RD working remotely.   DOCUMENTATION CODES:   (unable to assess for malnutrition at this time.)  INTERVENTION:  - will order Ensure Enlive BID, each supplement provides 350 kcal and 20 grams of protein. - will order 30 mL Prostat TID, each supplement provides 100 kcal and 15 grams of protein. - will order daily multivitamin with minerals. - continue to encourage PO intakes.    NUTRITION DIAGNOSIS:   Increased nutrient needs related to cancer and cancer related treatments, chronic illness as evidenced by estimated needs.  GOAL:   Patient will meet greater than or equal to 90% of their needs  MONITOR:   PO intake, Supplement acceptance, Labs, Weight trends, Skin  REASON FOR ASSESSMENT:   Malnutrition Screening Tool  ASSESSMENT:   58 y.o. 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 She presented to the ED on 9/8 for evaluation of severe and worsening back pain ongoing for several days. She reported that she was unable to move without exacerbation of pain. 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 Her treatment has been complicated by anemia requiring transfusions. She was discharged from the hospital just over a week ago and SNF was recommended at that time, but due to lack of bed availability, she went home with Home Health.  Unable to see/talk with patient at this time. Diet advanced from NPO to Regular today at 0912 and no intakes have been documented since that time.   Per chart review, current weight is 169 lb and weight on 8/27 was 182 lb. This indicates 13 lb weight loss (7% body weight) in 2 weeks; significant for time frame. Weight on 7/6 was 191 lb and this indicates 22 lb weight loss (11% body weight)  in 2 months; significant for time frame.  Suspect patient meets criteria for some degree of malnutrition but unable to confirm at this time.   Per notes: - intractable back pain-- diffuse osseous metastatic disease - metastatic breast cancer - hypokalemia--repletion ordered  - chronic hypoxic respiratory failure - macrocytic anemia - debility   Labs reviewed; CBG: 127 mg/dl, K: 2.9 mmol/l, Cl: 92 mmol/l, Mg: 2.8 mg/dl.  Medications reviewed; 10 mEq IV KCl x1 run 9/9, 40 mEq K-Dur x2 doses 9/9, 1 tablet senokot BID.      NUTRITION - FOCUSED PHYSICAL EXAM:  unable to complete at this time.   Diet Order:   Diet Order            Diet regular Room service appropriate? Yes; Fluid consistency: Thin  Diet effective now              EDUCATION NEEDS:   No education needs have been identified at this time  Skin:  Skin Assessment: Skin Integrity Issues: Skin Integrity Issues:: DTI DTI: sacrum  Last BM:  PTA/unknown  Height:   Ht Readings from Last 1 Encounters:  02/04/19 5\' 3"  (1.6 m)    Weight:   Wt Readings from Last 1 Encounters:  02/04/19 76.5 kg    Ideal Body Weight:  52.3 kg  BMI:  Body mass index is 29.88 kg/m.  Estimated Nutritional Needs:   Kcal:  2350-2550 kcal  Protein:  130-140 grams  Fluid:  >/= 1.5 L/day     Jarome Matin, MS, RD, LDN, CNSC Inpatient  Clinical Dietitian Pager # [phone] After hours/weekend pager # [phone]

## 2019-02-04 NOTE — Progress Notes (Addendum)
HEMATOLOGY-ONCOLOGY PROGRESS NOTE  SUBJECTIVE: Suzanne [name] is readmitted for back pain.  She was found to have a T12 compression fracture.  Her back pain has been worsening over the past 24 to 48 hours.  She has no numbness or weakness in her lower extremities.  No difficulty controlling her bowel or bladder.  Denies any recent injuries.  Current pain medications are helping.  Has been seen by neurosurgery who does not recommend any surgical intervention at this time.  Have advised an LSO brace and outpatient follow-up.  The patient remains on Ibrance 100 mg daily.  She started the cycle of Ibrance on 01/24/2019.  She is tolerating this well.  Denies nausea and vomiting.  The patient has expressed that she is aware that her daughter and her having a difficult time taking care of her at home and would like to pursue rehabilitation in a skilled nursing facility.  She states that she now has Medicaid which was approved earlier in September.  REVIEW OF SYSTEMS:   Constitutional: Denies fevers, chills or abnormal weight loss Eyes: Denies blurriness of vision Ears, nose, mouth, throat, and face: Denies mucositis or sore throat Respiratory: Denies cough, dyspnea or wheezes Cardiovascular: Denies palpitation, chest discomfort Gastrointestinal:  Denies nausea, heartburn or change in bowel habits Skin: Denies abnormal skin rashes Lymphatics: Denies new lymphadenopathy or easy bruising Neurological:Denies numbness, tingling or new weaknesses Behavioral/Psych: Mood is stable, no new changes  Extremities: No lower extremity edema MSK: Reports back pain All other systems were reviewed with the patient and are negative.  I have reviewed the past medical history, past surgical history, social history and family history with the patient and they are unchanged from previous note.   PHYSICAL EXAMINATION: ECOG PERFORMANCE STATUS: 2 - Symptomatic, <50% confined to bed  Vitals:   02/04/19 0516 02/04/19 0911  BP:  128/77 127/75  Pulse: (!) 107 99  Resp: 16 17  Temp: 98.3 F (36.8 C) 97.6 F (36.4 C)  SpO2: 97% 93%   Filed Weights   02/04/19 0336  Weight: 168 lb 10.4 oz (76.5 kg)    Intake/Output from previous day: 09/08 0701 - 09/09 0700 In: 126 [P.O.:100; I.V.:26] Out: -   GENERAL:alert, no distress and comfortable SKIN: skin color, texture, turgor are normal, no rashes or significant lesions EYES: normal, Conjunctiva are pink and non-injected, sclera clear OROPHARYNX:no exudate, no erythema and lips, buccal mucosa, and tongue normal  NECK: supple, thyroid normal size, non-tender, without nodularity LYMPH:  no palpable lymphadenopathy in the cervical, axillary or inguinal LUNGS: clear to auscultation and percussion with normal breathing effort HEART: regular rate & rhythm and no murmurs and no lower extremity edema ABDOMEN:abdomen soft, non-tender and normal bowel sounds Musculoskeletal: Spinal tenderness over the thoracic spine NEURO: alert & oriented x 3 with fluent speech, no focal motor/sensory deficits  LABORATORY DATA:  I have reviewed the data as listed CMP Latest Ref Rng & Units 02/04/2019 02/04/2019 02/03/2019  Glucose 70 - 99 mg/dL 127(H) QUESTIONABLE RESULTS, RECOMMEND RECOLLECT TO VERIFY 122(H)  BUN 6 - 20 mg/dL 15 QUESTIONABLE RESULTS, RECOMMEND RECOLLECT TO VERIFY 12  Creatinine 0.44 - 1.00 mg/dL 0.81 QUESTIONABLE RESULTS, RECOMMEND RECOLLECT TO VERIFY 0.83  Sodium 135 - 145 mmol/L 139 QUESTIONABLE RESULTS, RECOMMEND RECOLLECT TO VERIFY 141  Potassium 3.5 - 5.1 mmol/L 2.9(L) QUESTIONABLE RESULTS, RECOMMEND RECOLLECT TO VERIFY 2.7(LL)  Chloride 98 - 111 mmol/L 92(L) QUESTIONABLE RESULTS, RECOMMEND RECOLLECT TO VERIFY 91(L)  CO2 22 - 32 mmol/L 33(H) QUESTIONABLE RESULTS, RECOMMEND RECOLLECT TO  VERIFY 35(H)  Calcium 8.9 - 10.3 mg/dL 8.9 QUESTIONABLE RESULTS, RECOMMEND RECOLLECT TO VERIFY 8.9  Total Protein 6.5 - 8.1 g/dL - - -  Total Bilirubin 0.3 - 1.2 mg/dL - - -  Alkaline  Phos 38 - 126 U/L - - -  AST 15 - 41 U/L - - -  ALT 0 - 44 U/L - - -    Lab Results  Component Value Date   WBC 4.2 02/04/2019   HGB 9.5 (L) 02/04/2019   HCT 32.0 (L) 02/04/2019   MCV 108.5 (H) 02/04/2019   PLT 408 (H) 02/04/2019   NEUTROABS 3.1 02/04/2019    Dg Chest 1 View  Result Date: 01/20/2019 CLINICAL DATA:  Status post right thoracentesis. History of metastatic breast cancer EXAM: CHEST  1 VIEW COMPARISON:  01/17/2019 FINDINGS: Stable cardiomediastinal contours. Decrease right-sided pleural effusion. Lung volumes are low. No appreciable pneumothorax. A right-sided PICC line is present. Surgical fixation hardware in the proximal left humerus. Extensive osseous metastatic disease throughout the visualized thorax. IMPRESSION: Interval decrease in a right-sided pleural effusion. No pneumothorax identified. Electronically Signed   By: Davina Poke M.D.   On: 01/20/2019 15:54   Ct Thoracic Spine Wo Contrast  Result Date: 02/03/2019 CLINICAL DATA:  Mid back pain/T-spine pain, initial exam. EXAM: CT THORACIC SPINE WITHOUT CONTRAST TECHNIQUE: Multidetector CT images of the thoracic were obtained using the standard protocol without intravenous contrast. COMPARISON:  CT angiography chest 12/25/2018 FINDINGS: Alignment: Focal kyphosis centered at the T11-T12 level. Trace T12 bony retropulsion. Vertebrae: Redemonstrated marked diffuse osseous heterogeneity with multifocal sclerosis and lytic changes. Findings are consistent with diffuse osseous metastatic disease. New from prior chest CT 12/25/2018, there is a T12 anterior wedge compression deformity with approximately 50% loss of vertebral body height and trace bony retropulsion. Mild loss of T11 vertebral body height is also questioned, and this was not definitively present on prior examination. Widening of the T10-T11 and T11-T12 interspinous spaces appear similar to prior exam. Trace T12 bony retropulsion and T11-T12 gibbus deformity results in  mild to moderate bony narrowing of the spinal canal at the T11-T12 level. At the remaining levels, no high-grade bony spinal canal stenosis or neural foraminal narrowing. Vertebral body height is otherwise maintained. No other definite acute fracture to the thoracic spine is identified. Redemonstrated are multiple bilateral subacute pathologic rib fractures. Paraspinal and other soft tissues: Soft tissue prominence surrounding the T12 vertebra, which may reflect extra ertebral extension of metastatic disease. Small bilateral pleural effusion are similar to prior examination. Redemonstrated bilateral bandlike opacities which may reflect scar or fibrosis. Hiatal hernia containing what appears to be enteric contrast. Cholecystolithiasis Disc levels: Multilevel loss of intervertebral disc height. Thoracic spondylosis. Apart from mild to moderate narrowing at the T11-T12 level related to focal gibbus deformity and trace T12 bony retropulsion, no high-grade bony spinal canal stenosis or neural foraminal narrowing at the remaining levels. IMPRESSION: Redemonstrated diffuse osseous metastatic disease. New from prior chest CT 12/25/2018, there is an acute/subacute T12 compression deformity with approximately 50% loss of vertebral body height and trace bony retropulsion. Mild loss of T11 vertebral body height is also questioned, and new from prior examination. Resultant focal kyphosis at the T11-T12 level with mild to moderate spinal canal narrowing. Widening of the T10-T11 and T12-L1 interspinous spaces appears similar to prior examination, but MRI may be obtained for further evaluation to definitively exclude ligamentous injury. Soft tissue prominence surrounding the T12 vertebra which may reflect extra vertebral extension of metastatic disease. Consider  contrast-enhanced MRI for further evaluation, as clinically warranted. No other definite acute fracture to the thoracic spine. Redemonstrated are multiple nonacute  bilateral pathologic rib fractures. Small bilateral pleural effusions. Hiatal hernia Cholecystolithiasis. Electronically Signed   By: Kellie Simmering   On: 02/03/2019 18:13   Ct Lumbar Spine Wo Contrast  Result Date: 02/03/2019 CLINICAL DATA:  Back pain, osteoporosis and chronic steroid use. EXAM: CT LUMBAR SPINE WITHOUT CONTRAST TECHNIQUE: Multidetector CT imaging of the lumbar spine was performed without intravenous contrast administration. Multiplanar CT image reconstructions were also generated. COMPARISON:  CT angiography chest 12/25/2018, FINDINGS: Segmentation: For the purposes of this examination, 5 lumbar vertebrae are assumed and the caudal most well-formed intervertebral disc is designated L5-S1. Alignment: Trace L1-L2 and L2-L3 grade 1 retrolisthesis. Vertebrae: There is marked diffuse osseous heterogeneity with multifocal sclerotic and lytic changes. Findings are consistent with diffuse osseous metastatic disease. No lumbar compression deformity. Subacute appearing fracture of the right L1 transverse process. No other definite fracture of the lumbar spine is identified. Subacute appearing fracture of the posterior left twelfth rib. Paraspinal and other soft tissues: The imaged paraspinal soft tissues are unremarkable. Aortoiliac atherosclerotic disease. Disc levels: Moderate L1-L2 disc degeneration. Intervertebral disc height is otherwise preserved within the lumbar spine. Lumbar spondylosis without high-grade bony spinal canal or neural foraminal stenosis. IMPRESSION: Findings consistent with diffuse osseous metastatic disease. No lumbar compression deformity. Subacute appearing fracture of the right L1 transverse process. No other definite fracture of the lumbar spine is identified. Subacute appearing fracture of the posterior left twelfth rib. Electronically Signed   By: Kellie Simmering   On: 02/03/2019 17:48   Dg Chest Port 1 View  Result Date: 01/17/2019 CLINICAL DATA:  Shortness of breath, cough,  metastatic breast cancer, malignant pleural effusion EXAM: PORTABLE CHEST 1 VIEW COMPARISON:  01/13/2019 FINDINGS: No significant change in AP portable examination with large right, moderate left pleural effusions and associated atelectasis or consolidation. No new airspace opacity. The heart and mediastinum are predominantly obscured. Extensive sclerotic osseous metastatic disease with plate and screw fixation of the proximal left humerus. Right upper extremity PICC. IMPRESSION: No significant change in AP portable examination with large right, moderate left pleural effusions and associated atelectasis or consolidation. No new airspace opacity. The heart and mediastinum are predominantly obscured. Extensive sclerotic osseous metastatic disease with plate and screw fixation of the proximal left humerus. Right upper extremity PICC. Electronically Signed   By: Eddie Candle M.D.   On: 01/17/2019 14:42   Dg Chest Port 1 View  Result Date: 01/13/2019 CLINICAL DATA:  Aspiration into respiratory tract. Acute on chronic respiratory failure. EXAM: PORTABLE CHEST 1 VIEW COMPARISON:  Multiple prior exams most recently earlier this day. FINDINGS: Patient's chin obscures the apices. Right upper extremity PICC with tip unchanged. Progressive hazy opacity at the lung bases likely pleural effusions. Increasing vascular congestion. Grossly unchanged heart size and mediastinal contours. No pneumothorax. Osseous metastatic disease with recent surgical fixation of the left proximal humerus. IMPRESSION: Progressive hazy opacity at the lung bases likely pleural effusions. Increasing vascular congestion. Electronically Signed   By: Keith Rake M.D.   On: 01/13/2019 23:11   Dg Chest Port 1 View  Result Date: 01/13/2019 CLINICAL DATA:  Atelectasis EXAM: PORTABLE CHEST 1 VIEW COMPARISON:  01/10/2019 FINDINGS: Cardiac shadow is stable. Endotracheal tube is been removed as has a gastric catheter. Right-sided PICC line remains in  satisfactory position. Elevation of the right hemidiaphragm is again seen and stable. Bibasilar atelectatic changes are noted  increased from the prior study. Postsurgical changes in the left shoulder are noted. IMPRESSION: Interval removal of tubes and lines. Slight increase in bibasilar atelectasis. Electronically Signed   By: Inez Catalina M.D.   On: 01/13/2019 07:20   Dg Chest Port 1 View  Result Date: 01/10/2019 CLINICAL DATA:  Intubation and OG tube placement. EXAM: PORTABLE CHEST 1 VIEW COMPARISON:  Radiograph yesterday. FINDINGS: Endotracheal tube tip 2.4 cm from the carina. Enteric tube tip below the diaphragm not included in the field of view. Tip of the right upper extremity PICC in the upper SVC. Low lung volumes persist with slight decrease patchy opacities from prior exam. Persistent blunting of both costophrenic angles. Unchanged heart size and mediastinal contours. Bony metastatic disease again seen. Recent left humeral fixation is partially included. IMPRESSION: 1. Endotracheal tube tip 2.4 cm from the carina. Enteric tube tip below the diaphragm not included in the field of view. 2. Persistent low lung volumes. Slight improvement in bilateral patchy opacities from prior exam. Electronically Signed   By: Keith Rake M.D.   On: 01/10/2019 01:06   Dg Chest Port 1 View  Result Date: 01/09/2019 CLINICAL DATA:  Shortness of breath. EXAM: PORTABLE CHEST 1 VIEW COMPARISON:  Radiograph 01/05/2019, chest CT 12/25/2018 FINDINGS: Very low lung volumes limit assessment, lower lung volumes from prior exam. Right upper extremity PICC tip in the SVC. Cardiomediastinal contours are grossly stable, not well visualized due to low lung volumes. Progression in bilateral lung opacities, right perihilar and left lung base. Blunting of the costophrenic angles likely effusions. Osseous metastatic disease again seen. IMPRESSION: 1. Very low lung volumes, limited assessment lower lung volumes from prior exam. 2.  Progression in bilateral lung opacities, right perihilar and left lung base, may be combination of pulmonary edema and pneumonia. Suspected pleural effusions. Electronically Signed   By: Keith Rake M.D.   On: 01/09/2019 22:14   Dg Chest Port 1 View  Result Date: 01/05/2019 CLINICAL DATA:  Follow-up pleural effusion EXAM: PORTABLE CHEST 1 VIEW COMPARISON:  12/25/2018 FINDINGS: Cardiac shadow is stable. Elevation of the right hemidiaphragm is noted. Patchy changes are seen bilaterally but improved from the prior exam. A new right-sided PICC line is noted at the cavoatrial junction. Osseous metastatic disease is noted with changes in the proximal left humerus suspicious for fracture. IMPRESSION: Stable osseous metastatic disease. New right-sided PICC line with the tip at the cavoatrial junction. Patchy basilar infiltrates mildly improved from the prior study. Electronically Signed   By: Inez Catalina M.D.   On: 01/05/2019 16:27   Dg Shoulder Left Port  Result Date: 01/09/2019 CLINICAL DATA:  Postop EXAM: LEFT SHOULDER - 1 VIEW COMPARISON:  01/05/2019 FINDINGS: Interval surgical plate and multiple screw fixation of the left humerus for pathologic proximal humerus fracture. Near anatomic alignment. No humeral head dislocation. Numerous lytic and sclerotic lesions consistent with metastatic disease. Amorphous opacities the proximal humerus at the level of the fracture, presumed to represent surgical packing material. IMPRESSION: Interval surgical plate and multiple screw fixation of proximal left humerus fracture with expected operative changes. Numerous lytic and sclerotic osseous lesions, consistent with skeletal metastatic disease Electronically Signed   By: Donavan Foil M.D.   On: 01/09/2019 21:13   Dg Abd Portable 1v  Result Date: 01/10/2019 CLINICAL DATA:  OG tube placement. EXAM: PORTABLE ABDOMEN - 1 VIEW COMPARISON:  None. FINDINGS: Tip and side port of the enteric tube below the diaphragm in the  stomach. Relative paucity of upper abdominal bowel  gas. No evidence of free air or obstruction. IMPRESSION: Tip and side port of the enteric tube below the diaphragm in the stomach. Electronically Signed   By: Keith Rake M.D.   On: 01/10/2019 01:07   Dg Humerus Left  Result Date: 01/05/2019 CLINICAL DATA:  Known left humeral fracture, continued pain EXAM: LEFT HUMERUS - 2+ VIEW COMPARISON:  Radiograph 12/20/2018 FINDINGS: Redemonstration of the diffusely mottled sclerotic and lytic appearance of the bones compatible with widespread osseous metastatic disease. Redemonstration of the pathologic fracture through the large lytic lesion in the proximal left humeral diaphysis. There is similar to slightly increased valgus angulation across the fracture. No new fracture is evident. Diffuse soft tissue swelling is present. Multiple pathologic left rib fractures are again seen within the included portion of the chest wall. Coarse interstitial changes present in the left lung are similar to prior with small left effusion. IMPRESSION: Redemonstration of widespread osseous metastatic disease Pathologic fracture of the proximal left humerus with similar to slightly increased valgus angulation Multiple pathologic left rib fractures. Diffuse interstitial changes and patchy consolidation in the lungs. Suspect trace left effusion. Electronically Signed   By: Lovena Le M.D.   On: 01/05/2019 19:06   US Thoracentesis Asp Pleural Space W/img Guide  Result Date: 01/20/2019 INDICATION: Patient with history of metastatic breast cancer, recurrent malignant right pleural effusion. Request to IR for diagnostic and therapeutic right sided thoracentesis today. EXAM: ULTRASOUND GUIDED RIGHT THORACENTESIS MEDICATIONS: 10 mL 1% lidocaine COMPLICATIONS: None immediate. PROCEDURE: An ultrasound guided thoracentesis was thoroughly discussed with the patient and questions answered. The benefits, risks, alternatives and complications  were also discussed. The patient understands and wishes to proceed with the procedure. Written consent was obtained. Ultrasound was performed to localize and mark an adequate pocket of fluid in the right chest. The area was then prepped and draped in the normal sterile fashion. 1% Lidocaine was used for local anesthesia. Under ultrasound guidance a 6 Fr Safe-T-Centesis catheter was introduced. No pleural fluid was able to be aspirated. Patient with significant pain in her back and left shoulder which was present prior to the procedure and was unchanged during the procedure - she is unable to lay on her left side due to recent open reduction internal fixation of the left humerus. Due to significant pain patient requested that procedure be aborted. The catheter was removed and a dressing applied. FINDINGS: No pleural fluid obtained. IMPRESSION: Unsuccessful ultrasound guided right thoracentesis yielding no of pleural fluid. Read by Candiss Norse, PA-C Electronically Signed   By: Jacqulynn Cadet M.D.   On: 01/20/2019 15:54    ASSESSMENT: 58 y.o. [place], [place] woman presenting 10/26/2018 with right-sided inflammatory breast cancer, stage IV, involvnh lungs, lymph nodes and bones, as follows:             (a) chest CT scan 10/26/2018 shows bilateral pleural effusions, possible lymphangitic spread of tumor, diffuse bony metastatic disease, and significant axillary mediastinal and hilar adenopathy             (b) bone scan 10/27/2018 is a "near [place]" consistent with widespread bony metastatic disease             (c) head CT with and without contrast 10/30/2018 shows no intracranial metastatic disease, multiple calvarial lesions             (d) CA-27-29 on 10/27/2018 was 1810.4  (1) pleural fluid from right thoracentesis 10/28/2018 confirms malignant cells consistent with a breast primary, strongly estrogen and  progesterone receptor positive, HER-2 not amplified, with an MIB-1 of 2%  (2)  anastrozole started 10/29/2018             (a) palbociclib to start 11/13/2018             (b) denosumab/xgeva to start 11/13/2018  (3) associated problems:             (a) hypoxia secondary to effusions             (b) pain from bone lesions             (c) right upper extremity lymphedema             (d) anemia, requiring transfusion             (e) poor venous access  (4) genetics testing pending  PLAN: This is now Jamelia's fourth hospital admission since May 2020.  She is now admitted with back pain and found to have a T12 compression fracture.  She has been evaluated by neurosurgery and no surgical intervention is recommended.  I have placed a consult for interventional radiology evaluation to see if kyphoplasty or vertebroplasty may be an option to help control her pain.  For her breast cancer standpoint, recommend that she continue anastrozole and Ibrance.  She will need to bring her home supply of Ibrance.  Disposition has been challenging for [facility].  She now has Medicaid in place which may make placing her in a skilled nursing facility for rehabilitation easier.  The patient has expressed that she wishes to go to a skilled facility for rehabilitation to get stronger before going back home.  I think this would be best for her in terms of helping her take care of herself.  Recommend PT and OT consults while she is hospitalized as well as a care management consult for assistance with placement.   LOS: 0 days   Mikey Bussing, DNP, AGPCNP-BC, AOCNP 02/04/19    ADDENDUM: Tony is back in the hospital. This is not a surprise, as her functional status remains poor and her primary caregiver, her daughter, is well intentioned and remarkably functional given her history of cerebral palsy, but cannot assess patient's complaints or troubleshoot more than the simplest problems.  Devora is tolerating her anti-estrogen and CDK inhibitor remarkably well. Her breast cancer continues to  improve. I don't see any obvious reason she cannot live some years despite her diagnosis.   I agree with the suggestion of kyphoplasty with the goal being to relieve the patient's ain while avoiding narcotics, which would get in the way of her functional recovery. I am hopeful we can find a rehab facility where [facility] can undergo intensive physical therapy so she can return to her prior functional status.  Will follow with you  Chauncey Cruel, MD Medical Oncology and Hematology [facility] [address] [place], [place] [address] Tel. [phone]    Fax. [phone]

## 2019-02-04 NOTE — Progress Notes (Signed)
   02/04/19 0900  Clinical Encounter Type  Visited With Patient  Visit Type Initial;Psychological support;Spiritual support  Referral From Nurse  Consult/Referral To Chaplain  Spiritual Encounters  Spiritual Needs Emotional;Other (Comment) (Spiritual Care Conversation/Support)  Stress Factors  Patient Stress Factors None identified   I spoke briefly with Suzanne [name] per spiritual care consult. Suzanne [name] presented very depressed and tearful, but stated that she was "fine." She is open to me following back up with her.   Please, contact Spiritual Care for further assistance.   Chaplain Shanon Ace M.Div., [facility]

## 2019-02-04 NOTE — Plan of Care (Signed)
Continue current POC, plan for D/C to SNF

## 2019-02-04 NOTE — Telephone Encounter (Signed)
Returned patient's phone call regarding 09/10 appointment, spoke with patient's daughter and she informed me that patient is in the hospital and this appointment needs to be cancelled. Patient will call back when ready to reschedule.

## 2019-02-04 NOTE — Progress Notes (Signed)
Called and ordered [place] thoracic extension from [phone]

## 2019-02-04 NOTE — Evaluation (Signed)
Physical Therapy Evaluation Patient Details Name: Suzanne [name] MRN: [id number] DOB: Dec 07, 1960 Today's Date: 02/04/2019   History of Present Illness  Pt with recent admission for acute on chronic respiratory failure and hx of stage IV breast cancer (Dx May 2020) with diffuse osseous metastatic disease and lungs, chronic diastolic heart failure, anemia and pleural effusion. Pt s/p LUE ORIF 8/14 due to pathological fx.  Pt admitted for back pain on 02/03/19 and found to have T12 pathologic compression fracture  Clinical Impression  Pt admitted with above diagnosis.  Pt currently with functional limitations due to the deficits listed below (see PT Problem List). Pt will benefit from skilled PT to increase their independence and safety with mobility to allow discharge to the venue listed below.  Pt assisted to sitting EOB and politely declined further mobility at this time.  Pt hopeful to stand later today which was encouraged once back brace and sling applied.  Pt tearful at times during session as well.  Pt reports mobility decreased since last admission however she could take some steps with quad cane to get to bathroom at home.  Pt agreeable to SNF at this time.     Follow Up Recommendations SNF    Equipment Recommendations  None recommended by PT    Recommendations for Other Services       Precautions / Restrictions Precautions Precautions: Fall;Back Type of Shoulder Precautions: back for comfort Shoulder Interventions: Shoulder sling/immobilizer Precaution Comments: sling not in room pt reports new order) so created sling with bed sheet and requested RN check order Required Braces or Orthoses: Sling;Spinal Brace Spinal Brace: Lumbar corset(LSO for comfort ordered but not yet present in room) Restrictions Weight Bearing Restrictions: Yes LUE Weight Bearing: Non weight bearing Other Position/Activity Restrictions: L UE      Mobility  Bed Mobility Overal bed mobility: Needs  Assistance Bed Mobility: Supine to Sit;Sit to Supine     Supine to sit: Min assist Sit to supine: Min assist   General bed mobility comments: pt utilized HHA (states her family assists with this at home) to pull self upright, assist for LEs onto bed (would have increased difficulty with log roll due to hx of recent L UE ORIF and NWB)  Transfers                 General transfer comment: pt declined today, very tearful, pt encouraged to at least OOB to [facility] with nursing when sling and LSO arrive  Ambulation/Gait                Stairs            Wheelchair Mobility    Modified Rankin (Stroke Patients Only)       Balance                                             Pertinent Vitals/Pain Pain Assessment: Faces Faces Pain Scale: Hurts a little bit Pain Location: back Pain Descriptors / Indicators: Sore Pain Intervention(s): Repositioned;Premedicated before session;Monitored during session(pt reports pain much improved with pain meds)    Home Living Family/patient expects to be discharged to:: Skilled nursing facility Living Arrangements: Children   Type of Home: House Home Access: Stairs to enter     Home Layout: One level Home Equipment: Environmental consultant - 2 wheels;Cane - quad Additional Comments: pt lives with dtr who has  CP; cont to be hopeful for SNF placement    Prior Function Level of Independence: Independent with assistive device(s)         Comments: pt reports her mobility has declined since previous admission, she only ambulates a few feet to bathroom using quad cane due to her back pain     Hand Dominance        Extremity/Trunk Assessment   Upper Extremity Assessment LUE Deficits / Details: Recent ORIF, created sling with bed sheet for UE support during mobility    Lower Extremity Assessment Lower Extremity Assessment: Generalized weakness       Communication   Communication: No difficulties  Cognition  Arousal/Alertness: Awake/alert Behavior During Therapy: WFL for tasks assessed/performed Overall Cognitive Status: Within Functional Limits for tasks assessed                                 General Comments: pt very tearful during session; also states " I don't want to disappoint you."  pt encouraged to mobilize as tolerated and if unable to stand today then sitting EOB was an accomplishment      General Comments      Exercises     Assessment/Plan    PT Assessment Patient needs continued PT services  PT Problem List Decreased strength;Decreased range of motion;Decreased activity tolerance;Decreased balance;Decreased mobility;Decreased knowledge of use of DME;Obesity;Pain;Decreased knowledge of precautions       PT Treatment Interventions DME instruction;Gait training;Functional mobility training;Therapeutic activities;Therapeutic exercise;Patient/family education;Balance training    PT Goals (Current goals can be found in the Care Plan section)  Acute Rehab PT Goals PT Goal Formulation: With patient Time For Goal Achievement: 02/18/19 Potential to Achieve Goals: Fair    Frequency Min 3X/week   Barriers to discharge Inaccessible home environment;Decreased caregiver support      Co-evaluation               AM-PAC PT "6 Clicks" Mobility  Outcome Measure Help needed turning from your back to your side while in a flat bed without using bedrails?: A Lot Help needed moving from lying on your back to sitting on the side of a flat bed without using bedrails?: A Lot Help needed moving to and from a bed to a chair (including a wheelchair)?: A Lot Help needed standing up from a chair using your arms (e.g., wheelchair or bedside chair)?: A Lot Help needed to walk in hospital room?: A Little Help needed climbing 3-5 steps with a railing? : A Lot 6 Click Score: 13    End of Session Equipment Utilized During Treatment: Oxygen Activity Tolerance: Patient limited  by fatigue Patient left: in bed;with call bell/phone within reach;with bed alarm set   PT Visit Diagnosis: Difficulty in walking, not elsewhere classified (R26.2);Unsteadiness on feet (R26.81)    Time: 1134-1150 PT Time Calculation (min) (ACUTE ONLY): 16 min   Charges:   PT Evaluation $PT Eval Low Complexity: [place], PT, DPT Acute Rehabilitation Services Office: [phone] Pager: [phone]  Trena Platt 02/04/2019, 1:47 PM

## 2019-02-04 NOTE — Progress Notes (Signed)
PROGRESS NOTE    PAVITHRA DEBSKI  [id number] DOB: 10/29/1960 DOA: 02/03/2019 PCP: Patient, No Pcp Per    Brief Narrative:  58 y.o. 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  The patient reports several days of pain in the mid to upper back that has been rapidly worsening and severe to the point where she is unable to move without exacerbating the pain.  She denied any incontinence, weakness, or paresthesias associated with this.  She has not noted any fevers.  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  Her treatment is also been complicated by anemia requiring transfusions.  She was discharged from the hospital just over a week ago and SNF was recommended at that time, but due to lack of bed availability, she went home with home health.  She reports having adequate social support and states that her daughter works very hard to help her at home, but the patient is concerned that with her increase independence for basic ADLs, she will require more assistance than her daughter can provide. She denies any change in her chronic cough and SOB, reports starting azithromycin a couple days ago due to elevated WBC.   Assessment & Plan:   Principal Problem:   Intractable back pain Active Problems:   Hypokalemia   Macrocytic anemia   Malignant neoplasm of overlapping sites of right breast in female, estrogen receptor positive (HCC)   Pain from bone metastases (HCC)   Chronic respiratory failure with hypoxia and hypercapnia (HCC)   Thoracic compression fracture, closed, initial encounter ([place])   Essential hypertension  1. Intractable back pain; diffuse  osseous metastatic disease  - Presents with several days of severe back pain despite using oxycodone at home, denies inciting event, denies weakness/paresthesia/incontinence, and is found to have diffuse osseous metastatic disease on CT with subacute or acute T12 compression deformity   - Neurosurgery was consulted by ED physician, recs noted. No need for surgery at this time - Oncology recommended IR consult for consideration for kyphoplasty - Pt seen by PT. Per therapy, "Pt currently with functional limitations due to" multiple presenting deficits and "will benefit from skilled PT to increase their independence and safety with mobility to allow discharge." PT also reports an "Inaccessible home environment" with "decreased caregiver support," thus not safe for discharge yet  2. Metastatic breast cancer  - Presented in May 2020 with diffuse metastases at that time  - She is being treated with anastrozole, palbociclib, and Xgeva  - Seems stable. Oncology following  3. Hypokalemia  - Serum potassium is 2.7 in ED   - Will continue to correct, remains low this AM - Repeat bmet in AM  4. Hypertension  - BP at goal, continue Coreg  - Seems stable at this time   5. Chronic hypoxic respiratory failure  - Attributed to malignant pleural effusions and stable on 2 Lpm with no acute respiratory complaints  - Seems stable at this time  6. Macrocytic anemia - Hgb is 9.8 on admission  - Appears to be stable with no bleeding, likely secondary to chemotherapy  - Stable at present   7. Debility  - SNF was recommended at time of recent hospital discharge but no  beds were available and patient went home with home health  - Unfortunately, her condition has continued to decline and patient does not feel that her daughter will be able to provide all the assistance she now requires despite her best efforts   -PT following, recommendation for SNF per above  DVT prophylaxis: Lovenox subQ Code Status:  Full Family Communication: Pt in room, family at bedside Disposition Plan: SNF, timing uncertain  Consultants:   Oncology  Neurosurgery  IR  Procedures:     Antimicrobials: Anti-infectives (From admission, onward)   Start     Dose/Rate Route Frequency Ordered Stop   02/04/19 2200  azithromycin (ZITHROMAX) tablet 250 mg  Status:  Discontinued    Note to Pharmacy: 2 tab on day 1 then 1 tab daily until complete     250 mg Oral Daily at bedtime 02/03/19 2128 02/04/19 0907   02/03/19 2130  azithromycin (ZITHROMAX) tablet 500 mg    Note to Pharmacy: 2 tab on day 1 then 1 tab daily until complete     500 mg Oral NOW 02/03/19 1947 02/04/19 0100       Subjective: Reports pain is somewhat controlled, family in room is concerned about disopostion given lack of available assitance at home and unsafe living condition  Objective: Vitals:   02/04/19 0336 02/04/19 0516 02/04/19 0911 02/04/19 1559  BP:  128/77 127/75 131/67  Pulse:  (!) 107 99 99  Resp:  16 17   Temp:  98.3 F (36.8 C) 97.6 F (36.4 C)   TempSrc:  Oral Oral   SpO2:  97% 93%   Weight: 76.5 kg     Height:        Intake/Output Summary (Last 24 hours) at 02/04/2019 1630 Last data filed at 02/04/2019 1400 Gross per 24 hour  Intake 466 ml  Output 200 ml  Net 266 ml   Filed Weights   02/04/19 0336  Weight: 76.5 kg    Examination:  General exam: Appears calm and comfortable  Respiratory system: Clear to auscultation. Respiratory effort normal. Cardiovascular system: S1 & S2 heard, RRR. Gastrointestinal system: Abdomen is nondistended, soft and nontender. No organomegaly or masses felt. Normal bowel sounds heard. Central nervous system: Alert and oriented. No focal neurological deficits. Extremities: Symmetric, perfused Skin: No rashes, lesions Psychiatry: Judgement and insight appear normal. Mood & affect appropriate.   Data Reviewed: I have personally reviewed following labs and imaging  studies  CBC: Recent Labs  Lab 01/29/19 1400 02/03/19 1621 02/04/19 0325 02/04/19 0539  WBC 10.6* 4.9 QUESTIONABLE RESULTS, RECOMMEND RECOLLECT TO VERIFY 4.2  NEUTROABS 8.7* 3.2 QUESTIONABLE RESULTS, RECOMMEND RECOLLECT TO VERIFY 3.1  HGB 9.8* 9.8* QUESTIONABLE RESULTS, RECOMMEND RECOLLECT TO VERIFY 9.5*  HCT 32.3* 33.1* QUESTIONABLE RESULTS, RECOMMEND RECOLLECT TO VERIFY 32.0*  MCV 105.6* 106.1* QUESTIONABLE RESULTS, RECOMMEND RECOLLECT TO VERIFY 108.5*  PLT 509* 456* QUESTIONABLE RESULTS, RECOMMEND RECOLLECT TO VERIFY [id number]*   Basic Metabolic Panel: Recent Labs  Lab 01/29/19 1400 02/03/19 1621 02/04/19 0325 02/04/19 0539  NA 141 141 QUESTIONABLE RESULTS, RECOMMEND RECOLLECT TO VERIFY 139  K 3.2* 2.7* QUESTIONABLE RESULTS, RECOMMEND RECOLLECT TO VERIFY 2.9*  CL 94* 91* QUESTIONABLE RESULTS, RECOMMEND RECOLLECT TO VERIFY 92*  CO2 35* 35* QUESTIONABLE RESULTS, RECOMMEND RECOLLECT TO VERIFY 33*  GLUCOSE 140* 122* QUESTIONABLE RESULTS, RECOMMEND RECOLLECT TO VERIFY 127*  BUN 17 12 QUESTIONABLE RESULTS, RECOMMEND RECOLLECT TO VERIFY 15  CREATININE 0.75 0.83 QUESTIONABLE RESULTS, RECOMMEND RECOLLECT TO VERIFY 0.81  CALCIUM 9.4 8.9 QUESTIONABLE RESULTS, RECOMMEND RECOLLECT  TO VERIFY 8.9  MG  --   --  QUESTIONABLE RESULTS, RECOMMEND RECOLLECT TO VERIFY 2.8*   GFR: Estimated Creatinine Clearance: 72.3 mL/min (by C-G formula based on SCr of 0.81 mg/dL). Liver Function Tests: Recent Labs  Lab 01/29/19 1400  AST 14*  ALT 7  ALKPHOS 245*  BILITOT 0.6  PROT 7.4  ALBUMIN 2.8*   No results for input(s): LIPASE, AMYLASE in the last 168 hours. No results for input(s): AMMONIA in the last 168 hours. Coagulation Profile: No results for input(s): INR, PROTIME in the last 168 hours. Cardiac Enzymes: No results for input(s): CKTOTAL, CKMB, CKMBINDEX, TROPONINI in the last 168 hours. BNP (last 3 results) No results for input(s): PROBNP in the last 8760 hours. HbA1C: No results for  input(s): HGBA1C in the last 72 hours. CBG: Recent Labs  Lab 02/04/19 0741  GLUCAP 127*   Lipid Profile: No results for input(s): CHOL, HDL, LDLCALC, TRIG, CHOLHDL, LDLDIRECT in the last 72 hours. Thyroid Function Tests: No results for input(s): TSH, T4TOTAL, FREET4, T3FREE, THYROIDAB in the last 72 hours. Anemia Panel: No results for input(s): VITAMINB12, FOLATE, FERRITIN, TIBC, IRON, RETICCTPCT in the last 72 hours. Sepsis Labs: No results for input(s): PROCALCITON, LATICACIDVEN in the last 168 hours.  Recent Results (from the past 240 hour(s))  SARS Coronavirus 2 [facility] order, Performed in [facility] hospital lab) Nasopharyngeal Nasopharyngeal Swab     Status: None   Collection Time: 02/03/19  7:22 PM   Specimen: Nasopharyngeal Swab  Result Value Ref Range Status   SARS Coronavirus 2 NEGATIVE NEGATIVE Final    Comment: (NOTE) If result is NEGATIVE SARS-CoV-2 target nucleic acids are NOT DETECTED. The SARS-CoV-2 RNA is generally detectable in upper and lower  respiratory specimens during the acute phase of infection. The lowest  concentration of SARS-CoV-2 viral copies this assay can detect is 250  copies / mL. A negative result does not preclude SARS-CoV-2 infection  and should not be used as the sole basis for treatment or other  patient management decisions.  A negative result may occur with  improper specimen collection / handling, submission of specimen other  than nasopharyngeal swab, presence of viral mutation(s) within the  areas targeted by this assay, and inadequate number of viral copies  (<250 copies / mL). A negative result must be combined with clinical  observations, patient history, and epidemiological information. If result is POSITIVE SARS-CoV-2 target nucleic acids are DETECTED. The SARS-CoV-2 RNA is generally detectable in upper and lower  respiratory specimens dur ing the acute phase of infection.  Positive  results are indicative of active  infection with SARS-CoV-2.  Clinical  correlation with patient history and other diagnostic information is  necessary to determine patient infection status.  Positive results do  not rule out bacterial infection or co-infection with other viruses. If result is PRESUMPTIVE POSTIVE SARS-CoV-2 nucleic acids MAY BE PRESENT.   A presumptive positive result was obtained on the submitted specimen  and confirmed on repeat testing.  While 2019 novel coronavirus  (SARS-CoV-2) nucleic acids may be present in the submitted sample  additional confirmatory testing may be necessary for epidemiological  and / or clinical management purposes  to differentiate between  SARS-CoV-2 and other Sarbecovirus currently known to infect humans.  If clinically indicated additional testing with an alternate test  methodology [phone]) is advised. The SARS-CoV-2 RNA is generally  detectable in upper and lower respiratory sp ecimens during the acute  phase of infection. The expected result is  Negative. Fact Sheet for Patients:  StrictlyIdeas.no Fact Sheet for Healthcare Providers: BankingDealers.co.za This test is not yet approved or cleared by the [place] FDA and has been authorized for detection and/or diagnosis of SARS-CoV-2 by FDA under an Emergency Use Authorization (EUA).  This EUA will remain in effect (meaning this test can be used) for the duration of the COVID-19 declaration under Section 564(b)(1) of the Act, 21 U.S.C. section 360bbb-3(b)(1), unless the authorization is terminated or revoked sooner. Performed at [facility], [place] [address] [place], [place] [address]      Radiology Studies: Ct Thoracic Spine Wo Contrast  Result Date: 02/03/2019 CLINICAL DATA:  Mid back pain/T-spine pain, initial exam. EXAM: CT THORACIC SPINE WITHOUT CONTRAST TECHNIQUE: Multidetector CT images of the thoracic were obtained using the standard  protocol without intravenous contrast. COMPARISON:  CT angiography chest 12/25/2018 FINDINGS: Alignment: Focal kyphosis centered at the T11-T12 level. Trace T12 bony retropulsion. Vertebrae: Redemonstrated marked diffuse osseous heterogeneity with multifocal sclerosis and lytic changes. Findings are consistent with diffuse osseous metastatic disease. New from prior chest CT 12/25/2018, there is a T12 anterior wedge compression deformity with approximately 50% loss of vertebral body height and trace bony retropulsion. Mild loss of T11 vertebral body height is also questioned, and this was not definitively present on prior examination. Widening of the T10-T11 and T11-T12 interspinous spaces appear similar to prior exam. Trace T12 bony retropulsion and T11-T12 gibbus deformity results in mild to moderate bony narrowing of the spinal canal at the T11-T12 level. At the remaining levels, no high-grade bony spinal canal stenosis or neural foraminal narrowing. Vertebral body height is otherwise maintained. No other definite acute fracture to the thoracic spine is identified. Redemonstrated are multiple bilateral subacute pathologic rib fractures. Paraspinal and other soft tissues: Soft tissue prominence surrounding the T12 vertebra, which may reflect extra ertebral extension of metastatic disease. Small bilateral pleural effusion are similar to prior examination. Redemonstrated bilateral bandlike opacities which may reflect scar or fibrosis. Hiatal hernia containing what appears to be enteric contrast. Cholecystolithiasis Disc levels: Multilevel loss of intervertebral disc height. Thoracic spondylosis. Apart from mild to moderate narrowing at the T11-T12 level related to focal gibbus deformity and trace T12 bony retropulsion, no high-grade bony spinal canal stenosis or neural foraminal narrowing at the remaining levels. IMPRESSION: Redemonstrated diffuse osseous metastatic disease. New from prior chest CT 12/25/2018, there  is an acute/subacute T12 compression deformity with approximately 50% loss of vertebral body height and trace bony retropulsion. Mild loss of T11 vertebral body height is also questioned, and new from prior examination. Resultant focal kyphosis at the T11-T12 level with mild to moderate spinal canal narrowing. Widening of the T10-T11 and T12-L1 interspinous spaces appears similar to prior examination, but MRI may be obtained for further evaluation to definitively exclude ligamentous injury. Soft tissue prominence surrounding the T12 vertebra which may reflect extra vertebral extension of metastatic disease. Consider contrast-enhanced MRI for further evaluation, as clinically warranted. No other definite acute fracture to the thoracic spine. Redemonstrated are multiple nonacute bilateral pathologic rib fractures. Small bilateral pleural effusions. Hiatal hernia Cholecystolithiasis. Electronically Signed   By: Kellie Simmering   On: 02/03/2019 18:13   Ct Lumbar Spine Wo Contrast  Result Date: 02/03/2019 CLINICAL DATA:  Back pain, osteoporosis and chronic steroid use. EXAM: CT LUMBAR SPINE WITHOUT CONTRAST TECHNIQUE: Multidetector CT imaging of the lumbar spine was performed without intravenous contrast administration. Multiplanar CT image reconstructions were also generated. COMPARISON:  CT angiography chest 12/25/2018, FINDINGS:  Segmentation: For the purposes of this examination, 5 lumbar vertebrae are assumed and the caudal most well-formed intervertebral disc is designated L5-S1. Alignment: Trace L1-L2 and L2-L3 grade 1 retrolisthesis. Vertebrae: There is marked diffuse osseous heterogeneity with multifocal sclerotic and lytic changes. Findings are consistent with diffuse osseous metastatic disease. No lumbar compression deformity. Subacute appearing fracture of the right L1 transverse process. No other definite fracture of the lumbar spine is identified. Subacute appearing fracture of the posterior left twelfth  rib. Paraspinal and other soft tissues: The imaged paraspinal soft tissues are unremarkable. Aortoiliac atherosclerotic disease. Disc levels: Moderate L1-L2 disc degeneration. Intervertebral disc height is otherwise preserved within the lumbar spine. Lumbar spondylosis without high-grade bony spinal canal or neural foraminal stenosis. IMPRESSION: Findings consistent with diffuse osseous metastatic disease. No lumbar compression deformity. Subacute appearing fracture of the right L1 transverse process. No other definite fracture of the lumbar spine is identified. Subacute appearing fracture of the posterior left twelfth rib. Electronically Signed   By: Kellie Simmering   On: 02/03/2019 17:48    Scheduled Meds:  anastrozole  1 mg Oral Daily   carvedilol  3.125 mg Oral BID WC   dexamethasone (DECADRON) injection  2 mg Intravenous Q12H   enoxaparin (LOVENOX) injection  40 mg Subcutaneous QHS   feeding supplement (ENSURE ENLIVE)  237 mL Oral BID BM   feeding supplement (PRO-STAT SUGAR FREE 64)  30 mL Oral TID BM   gabapentin  100 mg Oral BID   lidocaine  1 patch Transdermal Q24H   multivitamin with minerals  1 tablet Oral Daily   nystatin   Topical TID   palbociclib  100 mg Oral Daily   senna  1 tablet Oral BID   sodium chloride flush  3 mL Intravenous Q12H   Continuous Infusions:  sodium chloride Stopped (02/04/19 0300)     LOS: 0 days   Marylu Lund, MD [facility] Pager On Amion  If 7PM-7AM, please contact night-coverage 02/04/2019, 4:30 PM

## 2019-02-04 NOTE — Consult Note (Signed)
Providing Compassionate, Quality Care - Together   Reason for Consult: Pathological fracture Referring Physician: Dr. Gabriel Rung Suzanne [name] is an 58 y.o. female.  HPI: Patient presented to the emergency department yesterday evening due to worsening back pain. She has a history significant for stage IV breast cancer with diffuse metastatic disease (diagnosed May 2020), hypertension, and obesity. She describes pain that is isolated to her mid back under her left scapula. The pain has been present for a while per the patient, but significantly worsened last Thursday and became unbearable on 02/03/2019. She denies any numbness, tingling, or weakness in her extremities. She denies bowel/bladder dysfunction or saddle paresthesia. She does not recall a precipitating event. The oxycodone and decadron have been helping with her pain since admission. She expresses concern with her mobility and her daughter's ability to continue to help her at home.  Past Medical History:  Diagnosis Date   Cancer [facility])    Family history of breast cancer    Family history of colon cancer    Family history of melanoma    Family history of prostate cancer    Gallstones    Glaucoma    Mastitis    reports history of recurrent mastitis   Metastatic breast cancer ([place])    Obesity     Past Surgical History:  Procedure Laterality Date   GLAUCOMA SURGERY  late 1990's   IR THORACENTESIS ASP PLEURAL SPACE W/IMG GUIDE  10/28/2018   IR THORACENTESIS ASP PLEURAL SPACE W/IMG GUIDE  10/29/2018   OPEN REDUCTION INTERNAL FIXATION (ORIF) DISTAL RADIAL FRACTURE Left 01/09/2019   Procedure: OPEN REDUCTION INTERNAL FIXATION (ORIF) HUMERAL FRACTURE;  Surgeon: Netta Cedars, MD;  Location: [facility];  Service: Orthopedics;  Laterality: Left;    Family History  Problem Relation Age of Onset   Breast cancer Sister        in her 12's-70s   Heart disease Brother        CABG   Heart disease Brother        CABG   Skin  cancer Brother        melanoma   Colon cancer Cousin    Heart attack Mother    Heart attack Father    Prostate cancer Brother     Social History:  reports that she has never smoked. She has never used smokeless tobacco. She reports that she does not drink alcohol or use drugs.  Allergies: No Known Allergies  Medications: I have reviewed the patient's current medications.  Results for orders placed or performed during the hospital encounter of 02/03/19 (from the past 48 hour(s))  Basic metabolic panel     Status: Abnormal   Collection Time: 02/03/19  4:21 PM  Result Value Ref Range   Sodium 141 135 - 145 mmol/L   Potassium 2.7 (LL) 3.5 - 5.1 mmol/L    Comment: CRITICAL RESULT CALLED TO, READ BACK BY AND VERIFIED WITH: S.LEONARD AT 1730 ON 02/03/19 BY N.THOMPSON    Chloride 91 (L) 98 - 111 mmol/L   CO2 35 (H) 22 - 32 mmol/L   Glucose, Bld 122 (H) 70 - 99 mg/dL   BUN 12 6 - 20 mg/dL   Creatinine, Ser 0.83 0.44 - 1.00 mg/dL   Calcium 8.9 8.9 - 10.3 mg/dL   GFR calc non Af Amer >60 >60 mL/min   GFR calc Af Amer >60 >60 mL/min   Anion gap 15 5 - 15    Comment: Performed at [facility], [place] [address], [place], [place] [address]  CBC with Differential     Status: Abnormal   Collection Time: 02/03/19  4:21 PM  Result Value Ref Range   WBC 4.9 4.0 - 10.5 K/uL   RBC 3.12 (L) 3.87 - 5.11 MIL/uL   Hemoglobin 9.8 (L) 12.0 - 15.0 g/dL   HCT 33.1 (L) 36.0 - 46.0 %   MCV 106.1 (H) 80.0 - 100.0 fL   MCH 31.4 26.0 - 34.0 pg   MCHC 29.6 (L) 30.0 - 36.0 g/dL   RDW 19.1 (H) 11.5 - 15.5 %   Platelets 456 (H) 150 - 400 K/uL   nRBC 0.0 0.0 - 0.2 %   Neutrophils Relative % 66 %   Neutro Abs 3.2 1.7 - 7.7 K/uL   Lymphocytes Relative 26 %   Lymphs Abs 1.3 0.7 - 4.0 K/uL   Monocytes Relative 4 %   Monocytes Absolute 0.2 0.1 - 1.0 K/uL   Eosinophils Relative 2 %   Eosinophils Absolute 0.1 0.0 - 0.5 K/uL   Basophils Relative 1 %   Basophils Absolute 0.0 0.0 - 0.1 K/uL     Immature Granulocytes 1 %   Abs Immature Granulocytes 0.03 0.00 - 0.07 K/uL    Comment: Performed at [facility], [place] [address] [place],  [address]  SARS Coronavirus 2 [facility] order, Performed in [facility] hospital lab) Nasopharyngeal Nasopharyngeal Swab     Status: None   Collection Time: 02/03/19  7:22 PM   Specimen: Nasopharyngeal Swab  Result Value Ref Range   SARS Coronavirus 2 NEGATIVE NEGATIVE    Comment: (NOTE) If result is NEGATIVE SARS-CoV-2 target nucleic acids are NOT DETECTED. The SARS-CoV-2 RNA is generally detectable in upper and lower  respiratory specimens during the acute phase of infection. The lowest  concentration of SARS-CoV-2 viral copies this assay can detect is 250  copies / mL. A negative result does not preclude SARS-CoV-2 infection  and should not be used as the sole basis for treatment or other  patient management decisions.  A negative result may occur with  improper specimen collection / handling, submission of specimen other  than nasopharyngeal swab, presence of viral mutation(s) within the  areas targeted by this assay, and inadequate number of viral copies  (<250 copies / mL). A negative result must be combined with clinical  observations, patient history, and epidemiological information. If result is POSITIVE SARS-CoV-2 target nucleic acids are DETECTED. The SARS-CoV-2 RNA is generally detectable in upper and lower  respiratory specimens dur ing the acute phase of infection.  Positive  results are indicative of active infection with SARS-CoV-2.  Clinical  correlation with patient history and other diagnostic information is  necessary to determine patient infection status.  Positive results do  not rule out bacterial infection or co-infection with other viruses. If result is PRESUMPTIVE POSTIVE SARS-CoV-2 nucleic acids MAY BE PRESENT.   A presumptive positive result was obtained on the submitted specimen  and  confirmed on repeat testing.  While 2019 novel coronavirus  (SARS-CoV-2) nucleic acids may be present in the submitted sample  additional confirmatory testing may be necessary for epidemiological  and / or clinical management purposes  to differentiate between  SARS-CoV-2 and other Sarbecovirus currently known to infect humans.  If clinically indicated additional testing with an alternate test  methodology [phone]) is advised. The SARS-CoV-2 RNA is generally  detectable in upper and lower respiratory sp ecimens during the acute  phase of  infection. The expected result is Negative. Fact Sheet for Patients:  StrictlyIdeas.no Fact Sheet for Healthcare Providers: BankingDealers.co.za This test is not yet approved or cleared by the [place] FDA and has been authorized for detection and/or diagnosis of SARS-CoV-2 by FDA under an Emergency Use Authorization (EUA).  This EUA will remain in effect (meaning this test can be used) for the duration of the COVID-19 declaration under Section 564(b)(1) of the Act, 21 U.S.C. section 360bbb-3(b)(1), unless the authorization is terminated or revoked sooner. Performed at [facility], [place] [address], [place], [place] [id number]   Basic metabolic panel     Status: None   Collection Time: 02/04/19  3:25 AM  Result Value Ref Range   Sodium  135 - 145 mmol/L    QUESTIONABLE RESULTS, RECOMMEND RECOLLECT TO VERIFY    Comment: REORDERED [id number] PER TRACY RN @ 0447 ON [id number] BY POTEAT,S   Potassium  3.5 - 5.1 mmol/L    QUESTIONABLE RESULTS, RECOMMEND RECOLLECT TO VERIFY    Comment: REORDERED [id number] PER TRACY RN @ 0447 ON [id number] BY POTEAT,S   Chloride  98 - 111 mmol/L    QUESTIONABLE RESULTS, RECOMMEND RECOLLECT TO VERIFY    Comment: REORDERED [id number] PER TRACY RN @ 0447 ON [id number] BY POTEAT,S   CO2  22 - 32 mmol/L    QUESTIONABLE RESULTS, RECOMMEND RECOLLECT TO VERIFY    Comment: REORDERED  [id number] PER TRACY RN @ [id number] ON [id number] BY POTEAT,S   Glucose, Bld  70 - 99 mg/dL    QUESTIONABLE RESULTS, RECOMMEND RECOLLECT TO VERIFY    Comment: REORDERED [id number] PER TRACY RN @ 0447 ON [id number] BY POTEAT,S   BUN  6 - 20 mg/dL    QUESTIONABLE RESULTS, RECOMMEND RECOLLECT TO VERIFY    Comment: REORDERED [id number] PER TRACY RN @ 0447 ON [id number] BY POTEAT,S   Creatinine, Ser  0.44 - 1.00 mg/dL    QUESTIONABLE RESULTS, RECOMMEND RECOLLECT TO VERIFY    Comment: REORDERED [id number] PER TRACY RN @ 0447 ON [id number] BY POTEAT,S   Calcium  8.9 - 10.3 mg/dL    QUESTIONABLE RESULTS, RECOMMEND RECOLLECT TO VERIFY    Comment: REORDERED [id number] PER TRACY RN @ 0447 ON [id number] BY POTEAT,S   GFR calc non Af Amer  >60 mL/min    QUESTIONABLE RESULTS, RECOMMEND RECOLLECT TO VERIFY    Comment: REORDERED [id number] PER TRACY RN @ 0447 ON [id number] BY POTEAT,S   GFR calc Af Amer  >60 mL/min    QUESTIONABLE RESULTS, RECOMMEND RECOLLECT TO VERIFY    Comment: REORDERED [id number] PER TRACY RN @ 0447 ON [id number] BY POTEAT,S   Anion gap  5 - 15    QUESTIONABLE RESULTS, RECOMMEND RECOLLECT TO VERIFY    Comment: REORDERED [id number] PER TRACY RN @ [id number] ON [id number] BY POTEAT,S Performed at [facility], [place] [address] [place], [place] [address]   Magnesium     Status: None   Collection Time: 02/04/19  3:25 AM  Result Value Ref Range   Magnesium  1.7 - 2.4 mg/dL    QUESTIONABLE RESULTS, RECOMMEND RECOLLECT TO VERIFY    Comment: REORDERED [id number] PER TRACY RN @ [id number] ON [id number] BY POTEAT,S Performed at [place] [address] [place], [place] [address]   CBC WITH DIFFERENTIAL     Status: None   Collection Time: 02/04/19  3:25 AM  Result Value Ref Range   WBC  4.0 - 10.5 K/uL    QUESTIONABLE RESULTS, RECOMMEND  RECOLLECT TO VERIFY    Comment: REORDERED [id number] PER TRACY RN @ [id number] ON [id number] BY POTEAT,S CORRECTED ON 09/09 AT 0448: PREVIOUSLY REPORTED AS 4.9    RBC  3.87 - 5.11 MIL/uL    QUESTIONABLE RESULTS,  RECOMMEND RECOLLECT TO VERIFY    Comment: REORDERED [id number] PER TRACY RN @ [id number] ON [id number] BY POTEAT,S CORRECTED ON 09/09 AT 0448: PREVIOUSLY REPORTED AS 3.04    Hemoglobin  12.0 - 15.0 g/dL    QUESTIONABLE RESULTS, RECOMMEND RECOLLECT TO VERIFY    Comment: REORDERED [id number] PER TRACY RN @ [id number] ON [id number] BY POTEAT,S CORRECTED ON 09/09 AT 0448: PREVIOUSLY REPORTED AS 9.8    HCT  36.0 - 46.0 %    QUESTIONABLE RESULTS, RECOMMEND RECOLLECT TO VERIFY    Comment: REORDERED [id number] PER TRACY RN @ [id number] ON [id number] BY POTEAT,S CORRECTED ON 09/09 AT 0448: PREVIOUSLY REPORTED AS 33.0    MCV  80.0 - 100.0 fL    QUESTIONABLE RESULTS, RECOMMEND RECOLLECT TO VERIFY    Comment: REORDERED [id number] PER TRACY RN @ [id number] ON [id number] BY POTEAT,S CORRECTED ON 09/09 AT 0448: PREVIOUSLY REPORTED AS 108.6    MCH  26.0 - 34.0 pg    QUESTIONABLE RESULTS, RECOMMEND RECOLLECT TO VERIFY    Comment: REORDERED [id number] PER TRACY RN @ [id number] ON [id number] BY POTEAT,S CORRECTED ON 09/09 AT 0448: PREVIOUSLY REPORTED AS 32.2    MCHC  30.0 - 36.0 g/dL    QUESTIONABLE RESULTS, RECOMMEND RECOLLECT TO VERIFY    Comment: REORDERED [id number] PER TRACY RN @ [id number] ON [id number] BY POTEAT,S CORRECTED ON 09/09 AT 0448: PREVIOUSLY REPORTED AS 29.7    RDW  11.5 - 15.5 %    QUESTIONABLE RESULTS, RECOMMEND RECOLLECT TO VERIFY    Comment: REORDERED [id number] PER TRACY RN @ 0447 ON [id number] BY POTEAT,S CORRECTED ON 09/09 AT 0448: PREVIOUSLY REPORTED AS 19.6    Platelets  150 - 400 K/uL    QUESTIONABLE RESULTS, RECOMMEND RECOLLECT TO VERIFY    Comment: REORDERED [id number] PER TRACY RN @ [id number] ON [id number] BY POTEAT,S CORRECTED ON 09/09 AT 0448: PREVIOUSLY REPORTED AS 414    nRBC  0.0 - 0.2 %    QUESTIONABLE RESULTS, RECOMMEND RECOLLECT TO VERIFY    Comment: REORDERED [id number] PER TRACY RN @ 0447 ON [id number] BY POTEAT,S CORRECTED ON 09/09 AT 0448: PREVIOUSLY REPORTED AS 0.0    Neutrophils Relative %  %    QUESTIONABLE RESULTS, RECOMMEND RECOLLECT TO VERIFY    Comment:  REORDERED [id number] PER TRACY RN @ 0447 ON [id number] BY POTEAT,S   Neutro Abs  1.7 - 7.7 K/uL    QUESTIONABLE RESULTS, RECOMMEND RECOLLECT TO VERIFY    Comment: REORDERED [id number] PER TRACY RN @ 0447 ON [id number] BY POTEAT,S   Band Neutrophils  %    QUESTIONABLE RESULTS, RECOMMEND RECOLLECT TO VERIFY    Comment: REORDERED [id number] PER TRACY RN @ 0447 ON [id number] BY POTEAT,S   Lymphocytes Relative  %    QUESTIONABLE RESULTS, RECOMMEND RECOLLECT TO VERIFY    Comment: REORDERED [id number] PER TRACY RN @ 0447 ON [id number] BY POTEAT,S   Lymphs Abs  0.7 - 4.0 K/uL    QUESTIONABLE RESULTS, RECOMMEND RECOLLECT TO VERIFY    Comment: REORDERED [id number] PER TRACY RN @ 0447 ON [id number] BY POTEAT,S   Monocytes Relative  %    QUESTIONABLE RESULTS, RECOMMEND RECOLLECT TO VERIFY    Comment: REORDERED [id number] PER TRACY RN @ 0447 ON [id number] BY POTEAT,S   Monocytes Absolute  0.1 - 1.0 K/uL    QUESTIONABLE RESULTS, RECOMMEND RECOLLECT TO VERIFY    Comment: REORDERED [id number] PER TRACY RN @ 0447 ON [id number] BY POTEAT,S   Eosinophils Relative  %    QUESTIONABLE RESULTS, RECOMMEND RECOLLECT TO VERIFY    Comment: REORDERED [id number] PER TRACY RN @ [id number] ON [id number] BY POTEAT,S   Eosinophils Absolute  0.0 - 0.5 K/uL    QUESTIONABLE RESULTS, RECOMMEND RECOLLECT TO VERIFY    Comment: REORDERED [id number] PER TRACY RN @ 0447 ON [id number] BY POTEAT,S   Basophils Relative  %    QUESTIONABLE RESULTS, RECOMMEND RECOLLECT TO VERIFY    Comment: REORDERED [id number] PER TRACY RN @ 0447 ON [id number] BY POTEAT,S   Basophils Absolute  0.0 - 0.1 K/uL    QUESTIONABLE RESULTS, RECOMMEND RECOLLECT TO VERIFY    Comment: REORDERED [id number] PER TRACY RN @ [id number] ON [id number] BY POTEAT,S   WBC Morphology      QUESTIONABLE RESULTS, RECOMMEND RECOLLECT TO VERIFY    Comment: REORDERED [id number] PER TRACY RN @ [id number] ON [id number] BY POTEAT,S   RBC Morphology      QUESTIONABLE RESULTS, RECOMMEND RECOLLECT TO VERIFY    Comment: REORDERED [id number] PER TRACY RN @ [id number] ON [id number] BY POTEAT,S   Smear Review       QUESTIONABLE RESULTS, RECOMMEND RECOLLECT TO VERIFY    Comment: REORDERED [id number] PER TRACY RN @ [id number] ON [id number] BY POTEAT,S   Other  %    QUESTIONABLE RESULTS, RECOMMEND RECOLLECT TO VERIFY    Comment: REORDERED [id number] PER TRACY RN @ 0447 ON [id number] BY POTEAT,S   nRBC  0 /100 WBC    QUESTIONABLE RESULTS, RECOMMEND RECOLLECT TO VERIFY    Comment: REORDERED [id number] PER TRACY RN @ 0447 ON [id number] BY POTEAT,S   Metamyelocytes Relative  %    QUESTIONABLE RESULTS, RECOMMEND RECOLLECT TO VERIFY    Comment: REORDERED [id number] PER TRACY RN @ 0447 ON [id number] BY POTEAT,S   Myelocytes  %    QUESTIONABLE RESULTS, RECOMMEND RECOLLECT TO VERIFY    Comment: REORDERED [id number] PER TRACY RN @ [id number] ON [id number] BY POTEAT,S   Promyelocytes Relative  %    QUESTIONABLE RESULTS, RECOMMEND RECOLLECT TO VERIFY    Comment: REORDERED [id number] PER TRACY RN @ [id number] ON [id number] BY POTEAT,S   Blasts  %    QUESTIONABLE RESULTS, RECOMMEND RECOLLECT TO VERIFY    Comment: REORDERED [id number] PER TRACY RN @ [id number] ON [id number] BY POTEAT,S Performed at [facility], [place] [address] [place], [place] [id number]   Basic metabolic panel     Status: Abnormal   Collection Time: 02/04/19  5:39 AM  Result Value Ref Range   Sodium 139 135 - 145 mmol/L   Potassium 2.9 (L) 3.5 - 5.1 mmol/L   Chloride 92 (L) 98 - 111 mmol/L   CO2 33 (H) 22 - 32 mmol/L   Glucose, Bld 127 (H) 70 - 99 mg/dL   BUN 15 6 - 20 mg/dL   Creatinine, Ser 0.81 0.44 - 1.00 mg/dL   Calcium 8.9 8.9 - 10.3 mg/dL   GFR calc non Af Amer >60 >60 mL/min   GFR calc Af Amer >60 >60 mL/min   Anion gap 14 5 - 15    Comment: Performed at [facility], [place] [address] [place], [place] [address]  CBC with Differential/Platelet     Status: Abnormal   Collection Time: 02/04/19  5:39 AM  Result Value Ref Range   WBC  4.2 4.0 - 10.5 K/uL   RBC 2.95 (L) 3.87 - 5.11 MIL/uL   Hemoglobin 9.5 (L) 12.0 - 15.0 g/dL   HCT 32.0 (L) 36.0 - 46.0 %   MCV 108.5 (H) 80.0 -  100.0 fL   MCH 32.2 26.0 - 34.0 pg   MCHC 29.7 (L) 30.0 - 36.0 g/dL   RDW 19.1 (H) 11.5 - 15.5 %   Platelets 408 (H) 150 - 400 K/uL   nRBC 0.0 0.0 - 0.2 %   Neutrophils Relative % 72 %   Neutro Abs 3.1 1.7 - 7.7 K/uL   Lymphocytes Relative 21 %   Lymphs Abs 0.9 0.7 - 4.0 K/uL   Monocytes Relative 4 %   Monocytes Absolute 0.2 0.1 - 1.0 K/uL   Eosinophils Relative 1 %   Eosinophils Absolute 0.1 0.0 - 0.5 K/uL   Basophils Relative 1 %   Basophils Absolute 0.0 0.0 - 0.1 K/uL   Immature Granulocytes 1 %   Abs Immature Granulocytes 0.02 0.00 - 0.07 K/uL   Polychromasia PRESENT     Comment: Performed at [facility], [place] [address], [place], [place] [address]  Magnesium     Status: Abnormal   Collection Time: 02/04/19  5:39 AM  Result Value Ref Range   Magnesium 2.8 (H) 1.7 - 2.4 mg/dL    Comment: Performed at [facility], [place] [address] [place], [place] [address]  Glucose, capillary     Status: Abnormal   Collection Time: 02/04/19  7:41 AM  Result Value Ref Range   Glucose-Capillary 127 (H) 70 - 99 mg/dL    Ct Thoracic Spine Wo Contrast  Result Date: 02/03/2019 CLINICAL DATA:  Mid back pain/T-spine pain, initial exam. EXAM: CT THORACIC SPINE WITHOUT CONTRAST TECHNIQUE: Multidetector CT images of the thoracic were obtained using the standard protocol without intravenous contrast. COMPARISON:  CT angiography chest 12/25/2018 FINDINGS: Alignment: Focal kyphosis centered at the T11-T12 level. Trace T12 bony retropulsion. Vertebrae: Redemonstrated marked diffuse osseous heterogeneity with multifocal sclerosis and lytic changes. Findings are consistent with diffuse osseous metastatic disease. New from prior chest CT 12/25/2018, there is a T12 anterior wedge compression deformity with approximately 50% loss of vertebral body height and trace bony retropulsion. Mild loss of T11 vertebral body height is also questioned, and this was not definitively present on  prior examination. Widening of the T10-T11 and T11-T12 interspinous spaces appear similar to prior exam. Trace T12 bony retropulsion and T11-T12 gibbus deformity results in mild to moderate bony narrowing of the spinal canal at the T11-T12 level. At the remaining levels, no high-grade bony spinal canal stenosis or neural foraminal narrowing. Vertebral body height is otherwise maintained. No other definite acute fracture to the thoracic spine is identified. Redemonstrated are multiple bilateral subacute pathologic rib fractures. Paraspinal and other soft tissues: Soft tissue prominence surrounding the T12 vertebra, which may reflect extra ertebral extension of metastatic disease. Small bilateral pleural effusion are similar to prior examination. Redemonstrated bilateral bandlike opacities which may reflect scar or fibrosis. Hiatal hernia containing what appears to be enteric contrast. Cholecystolithiasis Disc levels: Multilevel loss of intervertebral disc height. Thoracic spondylosis. Apart from mild to moderate narrowing at the T11-T12 level related to focal gibbus deformity and trace T12 bony retropulsion, no high-grade bony spinal canal stenosis or neural foraminal narrowing at the remaining levels. IMPRESSION: Redemonstrated diffuse osseous metastatic disease. New from prior chest CT 12/25/2018, there is an acute/subacute T12 compression deformity with approximately 50% loss of vertebral body  height and trace bony retropulsion. Mild loss of T11 vertebral body height is also questioned, and new from prior examination. Resultant focal kyphosis at the T11-T12 level with mild to moderate spinal canal narrowing. Widening of the T10-T11 and T12-L1 interspinous spaces appears similar to prior examination, but MRI may be obtained for further evaluation to definitively exclude ligamentous injury. Soft tissue prominence surrounding the T12 vertebra which may reflect extra vertebral extension of metastatic disease. Consider  contrast-enhanced MRI for further evaluation, as clinically warranted. No other definite acute fracture to the thoracic spine. Redemonstrated are multiple nonacute bilateral pathologic rib fractures. Small bilateral pleural effusions. Hiatal hernia Cholecystolithiasis. Electronically Signed   By: Kellie Simmering   On: 02/03/2019 18:13   Ct Lumbar Spine Wo Contrast  Result Date: 02/03/2019 CLINICAL DATA:  Back pain, osteoporosis and chronic steroid use. EXAM: CT LUMBAR SPINE WITHOUT CONTRAST TECHNIQUE: Multidetector CT imaging of the lumbar spine was performed without intravenous contrast administration. Multiplanar CT image reconstructions were also generated. COMPARISON:  CT angiography chest 12/25/2018, FINDINGS: Segmentation: For the purposes of this examination, 5 lumbar vertebrae are assumed and the caudal most well-formed intervertebral disc is designated L5-S1. Alignment: Trace L1-L2 and L2-L3 grade 1 retrolisthesis. Vertebrae: There is marked diffuse osseous heterogeneity with multifocal sclerotic and lytic changes. Findings are consistent with diffuse osseous metastatic disease. No lumbar compression deformity. Subacute appearing fracture of the right L1 transverse process. No other definite fracture of the lumbar spine is identified. Subacute appearing fracture of the posterior left twelfth rib. Paraspinal and other soft tissues: The imaged paraspinal soft tissues are unremarkable. Aortoiliac atherosclerotic disease. Disc levels: Moderate L1-L2 disc degeneration. Intervertebral disc height is otherwise preserved within the lumbar spine. Lumbar spondylosis without high-grade bony spinal canal or neural foraminal stenosis. IMPRESSION: Findings consistent with diffuse osseous metastatic disease. No lumbar compression deformity. Subacute appearing fracture of the right L1 transverse process. No other definite fracture of the lumbar spine is identified. Subacute appearing fracture of the posterior left twelfth  rib. Electronically Signed   By: Kellie Simmering   On: 02/03/2019 17:48    Review of Systems  Constitutional: Negative for chills, fever and weight loss.  HENT: Positive for hearing loss. Negative for congestion, ear pain, sinus pain, sore throat and tinnitus.   Eyes: Negative for blurred vision, double vision and photophobia.  Respiratory: Positive for cough and shortness of breath. Negative for hemoptysis, sputum production and wheezing.   Cardiovascular: Negative for chest pain, palpitations and orthopnea.  Gastrointestinal: Negative for abdominal pain, constipation, diarrhea, heartburn, nausea and vomiting.  Genitourinary: Negative.   Musculoskeletal: Positive for back pain and joint pain. Negative for falls and myalgias.  Skin: Negative for itching and rash.  Neurological: Negative for dizziness, tingling, sensory change, speech change, weakness and headaches.   Blood pressure 127/75, pulse 99, temperature 97.6 F (36.4 C), temperature source Oral, resp. rate 17, height 5\' 3"  (1.6 m), weight 76.5 kg, last menstrual period 05/28/2013, SpO2 93 %. Physical Exam  Constitutional: She is oriented to person, place, and time. She appears well-developed.  HENT:  Head: Normocephalic and atraumatic.  Eyes: Pupils are equal, round, and reactive to light. Conjunctivae and EOM are normal.  Neck: Normal range of motion. Neck supple.  Cardiovascular: Normal rate and regular rhythm.  Respiratory: Effort normal. No respiratory distress.  GI: Soft.  Musculoskeletal:        General: Tenderness present.     Thoracic back: She exhibits pain. She exhibits no deformity.  Neurological: She is  alert and oriented to person, place, and time. She has normal reflexes.  Skin: Skin is warm and dry.  Psychiatric: She has a normal mood and affect. Her behavior is normal. Judgment and thought content normal.    Assessment/Plan: Patient with stage IV breast cancer with diffuse osseous metastatic disease presented  with T12 pathologic compression fracture. There is no surgical recommendation at this time. Recommend donning an LSO brace with thoracic extension for patient's comfort-order has been placed. She can follow up with Dr. Annette Stable of neurosurgery as an outpatient. Patient feels she will need more care than her daughter can provide when discharged. Physical and occupational therapies to evaluate and make recommendations.  Patricia [place] 02/04/2019, 9:16 AM

## 2019-02-04 NOTE — Progress Notes (Signed)
Low UOP, no output last 6 hour, blabber scan 347ml, Notified MD Chiu.see new orders

## 2019-02-04 NOTE — Consult Note (Signed)
Chief Complaint: Patient was seen in consultation today for back pain  Referring Physician(s): Mikey Bussing, NP  Supervising Physician: Jacqulynn Cadet  Patient Status: [facility] - In-pt  History of Present Illness: Suzanne [name] is a 58 y.o. 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  Patient with history of pathologic fracture of the left humerus s/p ORIF 01/09/19.  She has had multiple hospitalizations over the past several months related to fracture and infections.  She completed 4 weeks of IV antibiotics 8/27 for MSSA/septic bacteremia from a sacral decubitus ulcer.  She had a positive urine culture 8/20 which grew enterobacter.  She restarted PO antibiotics 9/3 for possible CAP with elevated WBC and cough.   CT Thoracic Spine 9/8: Redemonstrated diffuse osseous metastatic disease.  New from prior chest CT 12/25/2018, there is an acute/subacute T12 compression deformity with approximately 50% loss of vertebral body height and trace bony retropulsion. Mild loss of T11 vertebral body height is also questioned, and new from prior examination. Resultant focal kyphosis at the T11-T12 level with mild to moderate spinal canal narrowing. Widening of the T10-T11 and T12-L1 interspinous spaces appears similar to prior examination, but MRI may be obtained for further evaluation to definitively exclude ligamentous injury.  Soft tissue prominence surrounding the T12 vertebra which may reflect extra vertebral extension of metastatic disease. Consider contrast-enhanced MRI for further evaluation, as clinically warranted.  IR consulted for possible vertebroplasty/kyphoplasty.  Patient reports acute onset of back "tightness" last Thursday.  States she does have chronic back pain/chronic pain for which she takes regular pain medication.  She reports the discomfort and "tightness"  worsened prompting her to seek emergent evaluation.  She did have acute pain when attempted to stand to transfer from the bed to the stretcher when EMS arrived.  She reports she can get comfortable in bed, but has acute pain with sitting/standing upright. The pain is localized to beneath the left scapula.    Past Medical History:  Diagnosis Date   Cancer [facility])    Family history of breast cancer    Family history of colon cancer    Family history of melanoma    Family history of prostate cancer    Gallstones    Glaucoma    Mastitis    reports history of recurrent mastitis   Metastatic breast cancer ([place])    Obesity     Past Surgical History:  Procedure Laterality Date   GLAUCOMA SURGERY  late 1990's   IR THORACENTESIS ASP PLEURAL SPACE W/IMG GUIDE  10/28/2018   IR THORACENTESIS ASP PLEURAL SPACE W/IMG GUIDE  10/29/2018   OPEN REDUCTION INTERNAL FIXATION (ORIF) DISTAL RADIAL FRACTURE Left 01/09/2019   Procedure: OPEN REDUCTION INTERNAL FIXATION (ORIF) HUMERAL FRACTURE;  Surgeon: Netta Cedars, MD;  Location: [facility] ORS;  Service: Orthopedics;  Laterality: Left;    Allergies: Patient has no known allergies.  Medications: Prior to Admission medications   Medication Sig Start Date End Date Taking? Authorizing Provider  albuterol (VENTOLIN HFA) 108 (90 Base) MCG/ACT inhaler Inhale 2 puffs into the lungs every 6 (six) hours as needed for wheezing or shortness of breath. 12/01/18  Yes Rai, Ripudeep K, MD  ALPRAZolam (XANAX) 0.25 MG tablet Take 1-2 tablets (0.25-0.5 mg total) by mouth every 8 (eight) hours as needed for anxiety. 01/29/19  Yes Causey, Charlestine Massed, NP  anastrozole (ARIMIDEX) 1 MG tablet Take 1 tablet (1 mg total)  by mouth daily. 12/12/18  Yes [name], Virgie Dad, MD  antiseptic oral rinse (BIOTENE) LIQD 15 mLs by Mouth Rinse route as needed for dry mouth.   Yes [provider]  azithromycin (ZITHROMAX Z-PAK) 250 MG tablet 2 tab on day 1 then 1 tab daily until  complete Patient taking differently: Take 250 mg by mouth daily.  01/29/19  Yes Causey, Charlestine Massed, NP  benzonatate (TESSALON) 200 MG capsule TAKE 1 CAPSULE BY MOUTH 3 TIMES DAILY IF NEEDED Patient taking differently: Take 200 mg by mouth 3 (three) times daily as needed for cough.  12/23/18  Yes [name], Virgie Dad, MD  calcium-vitamin D (OSCAL WITH D) 500-200 MG-UNIT tablet Take 1 tablet by mouth 3 (three) times daily. 11/30/18  Yes Rai, Ripudeep K, MD  carvedilol (COREG) 3.125 MG tablet Take 1 tablet (3.125 mg total) by mouth 2 (two) times daily with a meal. 01/24/19 02/23/19 Yes Donne Hazel, MD  furosemide (LASIX) 20 MG tablet Take 2 tablets (40 mg total) by mouth 2 (two) times daily. 12/09/18  Yes Causey, Charlestine Massed, NP  loratadine (CLARITIN) 10 MG tablet Take 1 tablet (10 mg total) by mouth daily. 11/29/18  Yes Rai, Ripudeep K, MD  nystatin (MYCOSTATIN/NYSTOP) powder Apply topically 3 (three) times daily. Apply under breast area 11/28/18  Yes Rai, Ripudeep K, MD  oxyCODONE (OXY IR/ROXICODONE) 5 MG immediate release tablet Take 0.5 tablets (2.5 mg total) by mouth every 4 (four) hours as needed for moderate pain or severe pain. 01/24/19  Yes Donne Hazel, MD  palbociclib [facility]) 100 MG capsule Take 1 capsule (100 mg total) by mouth daily with breakfast. Take whole with food. Take for 21 days on, 7 days off, repeat every 28 days. Patient taking differently: Take 100 mg by mouth daily with breakfast. Take for 21 days on, 7 days off, repeat every 28 days. 12/22/18  Yes [name], Virgie Dad, MD  polyethylene glycol (MIRALAX / GLYCOLAX) 17 g packet Take 17 g by mouth as needed for constipation.   Yes [provider]     Family History  Problem Relation Age of Onset   Breast cancer Sister        in her 77's-70s   Heart disease Brother        CABG   Heart disease Brother        CABG   Skin cancer Brother        melanoma   Colon cancer Cousin    Heart attack Mother    Heart  attack Father    Prostate cancer Brother     Social History   Socioeconomic History   Marital status: Widowed    Spouse name: Not on file   Number of children: 1   Years of education: Not on file   Highest education level: Not on file  Occupational History   Occupation: Clinical research associate Programs    Comment: Self-employed  Social Designer, fashion/clothing strain: Not on file   Food insecurity    Worry: Not on file    Inability: Not on file   Transportation needs    Medical: Yes    Non-medical: No  Tobacco Use   Smoking status: Never Smoker   Smokeless tobacco: Never Used   Tobacco comment: second hand smoke exposure  Substance and Sexual Activity   Alcohol use: No   Drug use: No   Sexual activity: Not Currently  Lifestyle   Physical activity    Days per  week: Not on file    Minutes per session: Not on file   Stress: Not on file  Relationships   Social connections    Talks on phone: Not on file    Gets together: Not on file    Attends religious service: Not on file    Active member of club or organization: Not on file    Attends meetings of clubs or organizations: Not on file    Relationship status: Not on file  Other Topics Concern   Not on file  Social History Narrative   Has a daughter named [name]. Daughter has CP.     Review of Systems: A 12 point ROS discussed and pertinent positives are indicated in the HPI above.  All other systems are negative.  Review of Systems  Constitutional: Negative for fatigue and fever.  Respiratory: Negative for cough and shortness of breath.   Cardiovascular: Negative for chest pain.  Gastrointestinal: Negative for abdominal pain, nausea and vomiting.  Musculoskeletal: Positive for back pain.  Psychiatric/Behavioral: Negative for behavioral problems and confusion.    Vital Signs: BP 127/75 (BP Location: Right Arm)    Pulse 99    Temp 97.6 F (36.4 C) (Oral)    Resp 17    Ht 5\' 3"  (1.6 m)    Wt  168 lb 10.4 oz (76.5 kg)    LMP 05/28/2013 (Within Months)    SpO2 93%    BMI 29.88 kg/m   Physical Exam Vitals signs and nursing note reviewed.  Constitutional:      Appearance: Normal appearance.  Cardiovascular:     Rate and Rhythm: Normal rate and regular rhythm.  Pulmonary:     Effort: Pulmonary effort is normal. No respiratory distress.     Breath sounds: Normal breath sounds.  Abdominal:     General: Abdomen is flat.     Palpations: Abdomen is soft.  Musculoskeletal:        General: Tenderness (L sided back pain) present.  Neurological:     General: No focal deficit present.     Mental Status: She is alert and oriented to person, place, and time. Mental status is at baseline.  Psychiatric:        Mood and Affect: Mood normal.        Behavior: Behavior normal.        Thought Content: Thought content normal.        Judgment: Judgment normal.      MD Evaluation Airway: WNL Heart: WNL Abdomen: WNL Chest/ Lungs: WNL ASA  Classification: 3 Mallampati/Airway Score: Two   Imaging: Dg Chest 1 View  Result Date: 01/20/2019 CLINICAL DATA:  Status post right thoracentesis. History of metastatic breast cancer EXAM: CHEST  1 VIEW COMPARISON:  01/17/2019 FINDINGS: Stable cardiomediastinal contours. Decrease right-sided pleural effusion. Lung volumes are low. No appreciable pneumothorax. A right-sided PICC line is present. Surgical fixation hardware in the proximal left humerus. Extensive osseous metastatic disease throughout the visualized thorax. IMPRESSION: Interval decrease in a right-sided pleural effusion. No pneumothorax identified. Electronically Signed   By: Davina Poke M.D.   On: 01/20/2019 15:54   Ct Thoracic Spine Wo Contrast  Result Date: 02/03/2019 CLINICAL DATA:  Mid back pain/T-spine pain, initial exam. EXAM: CT THORACIC SPINE WITHOUT CONTRAST TECHNIQUE: Multidetector CT images of the thoracic were obtained using the standard protocol without intravenous  contrast. COMPARISON:  CT angiography chest 12/25/2018 FINDINGS: Alignment: Focal kyphosis centered at the T11-T12 level. Trace T12 bony retropulsion. Vertebrae: Redemonstrated marked  diffuse osseous heterogeneity with multifocal sclerosis and lytic changes. Findings are consistent with diffuse osseous metastatic disease. New from prior chest CT 12/25/2018, there is a T12 anterior wedge compression deformity with approximately 50% loss of vertebral body height and trace bony retropulsion. Mild loss of T11 vertebral body height is also questioned, and this was not definitively present on prior examination. Widening of the T10-T11 and T11-T12 interspinous spaces appear similar to prior exam. Trace T12 bony retropulsion and T11-T12 gibbus deformity results in mild to moderate bony narrowing of the spinal canal at the T11-T12 level. At the remaining levels, no high-grade bony spinal canal stenosis or neural foraminal narrowing. Vertebral body height is otherwise maintained. No other definite acute fracture to the thoracic spine is identified. Redemonstrated are multiple bilateral subacute pathologic rib fractures. Paraspinal and other soft tissues: Soft tissue prominence surrounding the T12 vertebra, which may reflect extra ertebral extension of metastatic disease. Small bilateral pleural effusion are similar to prior examination. Redemonstrated bilateral bandlike opacities which may reflect scar or fibrosis. Hiatal hernia containing what appears to be enteric contrast. Cholecystolithiasis Disc levels: Multilevel loss of intervertebral disc height. Thoracic spondylosis. Apart from mild to moderate narrowing at the T11-T12 level related to focal gibbus deformity and trace T12 bony retropulsion, no high-grade bony spinal canal stenosis or neural foraminal narrowing at the remaining levels. IMPRESSION: Redemonstrated diffuse osseous metastatic disease. New from prior chest CT 12/25/2018, there is an acute/subacute T12  compression deformity with approximately 50% loss of vertebral body height and trace bony retropulsion. Mild loss of T11 vertebral body height is also questioned, and new from prior examination. Resultant focal kyphosis at the T11-T12 level with mild to moderate spinal canal narrowing. Widening of the T10-T11 and T12-L1 interspinous spaces appears similar to prior examination, but MRI may be obtained for further evaluation to definitively exclude ligamentous injury. Soft tissue prominence surrounding the T12 vertebra which may reflect extra vertebral extension of metastatic disease. Consider contrast-enhanced MRI for further evaluation, as clinically warranted. No other definite acute fracture to the thoracic spine. Redemonstrated are multiple nonacute bilateral pathologic rib fractures. Small bilateral pleural effusions. Hiatal hernia Cholecystolithiasis. Electronically Signed   By: Kellie Simmering   On: 02/03/2019 18:13   Ct Lumbar Spine Wo Contrast  Result Date: 02/03/2019 CLINICAL DATA:  Back pain, osteoporosis and chronic steroid use. EXAM: CT LUMBAR SPINE WITHOUT CONTRAST TECHNIQUE: Multidetector CT imaging of the lumbar spine was performed without intravenous contrast administration. Multiplanar CT image reconstructions were also generated. COMPARISON:  CT angiography chest 12/25/2018, FINDINGS: Segmentation: For the purposes of this examination, 5 lumbar vertebrae are assumed and the caudal most well-formed intervertebral disc is designated L5-S1. Alignment: Trace L1-L2 and L2-L3 grade 1 retrolisthesis. Vertebrae: There is marked diffuse osseous heterogeneity with multifocal sclerotic and lytic changes. Findings are consistent with diffuse osseous metastatic disease. No lumbar compression deformity. Subacute appearing fracture of the right L1 transverse process. No other definite fracture of the lumbar spine is identified. Subacute appearing fracture of the posterior left twelfth rib. Paraspinal and other  soft tissues: The imaged paraspinal soft tissues are unremarkable. Aortoiliac atherosclerotic disease. Disc levels: Moderate L1-L2 disc degeneration. Intervertebral disc height is otherwise preserved within the lumbar spine. Lumbar spondylosis without high-grade bony spinal canal or neural foraminal stenosis. IMPRESSION: Findings consistent with diffuse osseous metastatic disease. No lumbar compression deformity. Subacute appearing fracture of the right L1 transverse process. No other definite fracture of the lumbar spine is identified. Subacute appearing fracture of the posterior left twelfth  rib. Electronically Signed   By: Kellie Simmering   On: 02/03/2019 17:48   Dg Chest Port 1 View  Result Date: 01/17/2019 CLINICAL DATA:  Shortness of breath, cough, metastatic breast cancer, malignant pleural effusion EXAM: PORTABLE CHEST 1 VIEW COMPARISON:  01/13/2019 FINDINGS: No significant change in AP portable examination with large right, moderate left pleural effusions and associated atelectasis or consolidation. No new airspace opacity. The heart and mediastinum are predominantly obscured. Extensive sclerotic osseous metastatic disease with plate and screw fixation of the proximal left humerus. Right upper extremity PICC. IMPRESSION: No significant change in AP portable examination with large right, moderate left pleural effusions and associated atelectasis or consolidation. No new airspace opacity. The heart and mediastinum are predominantly obscured. Extensive sclerotic osseous metastatic disease with plate and screw fixation of the proximal left humerus. Right upper extremity PICC. Electronically Signed   By: Eddie Candle M.D.   On: 01/17/2019 14:42   Dg Chest Port 1 View  Result Date: 01/13/2019 CLINICAL DATA:  Aspiration into respiratory tract. Acute on chronic respiratory failure. EXAM: PORTABLE CHEST 1 VIEW COMPARISON:  Multiple prior exams most recently earlier this day. FINDINGS: Patient's chin obscures  the apices. Right upper extremity PICC with tip unchanged. Progressive hazy opacity at the lung bases likely pleural effusions. Increasing vascular congestion. Grossly unchanged heart size and mediastinal contours. No pneumothorax. Osseous metastatic disease with recent surgical fixation of the left proximal humerus. IMPRESSION: Progressive hazy opacity at the lung bases likely pleural effusions. Increasing vascular congestion. Electronically Signed   By: Keith Rake M.D.   On: 01/13/2019 23:11   Dg Chest Port 1 View  Result Date: 01/13/2019 CLINICAL DATA:  Atelectasis EXAM: PORTABLE CHEST 1 VIEW COMPARISON:  01/10/2019 FINDINGS: Cardiac shadow is stable. Endotracheal tube is been removed as has a gastric catheter. Right-sided PICC line remains in satisfactory position. Elevation of the right hemidiaphragm is again seen and stable. Bibasilar atelectatic changes are noted increased from the prior study. Postsurgical changes in the left shoulder are noted. IMPRESSION: Interval removal of tubes and lines. Slight increase in bibasilar atelectasis. Electronically Signed   By: Inez Catalina M.D.   On: 01/13/2019 07:20   Dg Chest Port 1 View  Result Date: 01/10/2019 CLINICAL DATA:  Intubation and OG tube placement. EXAM: PORTABLE CHEST 1 VIEW COMPARISON:  Radiograph yesterday. FINDINGS: Endotracheal tube tip 2.4 cm from the carina. Enteric tube tip below the diaphragm not included in the field of view. Tip of the right upper extremity PICC in the upper SVC. Low lung volumes persist with slight decrease patchy opacities from prior exam. Persistent blunting of both costophrenic angles. Unchanged heart size and mediastinal contours. Bony metastatic disease again seen. Recent left humeral fixation is partially included. IMPRESSION: 1. Endotracheal tube tip 2.4 cm from the carina. Enteric tube tip below the diaphragm not included in the field of view. 2. Persistent low lung volumes. Slight improvement in bilateral  patchy opacities from prior exam. Electronically Signed   By: Keith Rake M.D.   On: 01/10/2019 01:06   Dg Chest Port 1 View  Result Date: 01/09/2019 CLINICAL DATA:  Shortness of breath. EXAM: PORTABLE CHEST 1 VIEW COMPARISON:  Radiograph 01/05/2019, chest CT 12/25/2018 FINDINGS: Very low lung volumes limit assessment, lower lung volumes from prior exam. Right upper extremity PICC tip in the SVC. Cardiomediastinal contours are grossly stable, not well visualized due to low lung volumes. Progression in bilateral lung opacities, right perihilar and left lung base. Blunting of the costophrenic  angles likely effusions. Osseous metastatic disease again seen. IMPRESSION: 1. Very low lung volumes, limited assessment lower lung volumes from prior exam. 2. Progression in bilateral lung opacities, right perihilar and left lung base, may be combination of pulmonary edema and pneumonia. Suspected pleural effusions. Electronically Signed   By: Keith Rake M.D.   On: 01/09/2019 22:14   Dg Chest Port 1 View  Result Date: 01/05/2019 CLINICAL DATA:  Follow-up pleural effusion EXAM: PORTABLE CHEST 1 VIEW COMPARISON:  12/25/2018 FINDINGS: Cardiac shadow is stable. Elevation of the right hemidiaphragm is noted. Patchy changes are seen bilaterally but improved from the prior exam. A new right-sided PICC line is noted at the cavoatrial junction. Osseous metastatic disease is noted with changes in the proximal left humerus suspicious for fracture. IMPRESSION: Stable osseous metastatic disease. New right-sided PICC line with the tip at the cavoatrial junction. Patchy basilar infiltrates mildly improved from the prior study. Electronically Signed   By: Inez Catalina M.D.   On: 01/05/2019 16:27   Dg Shoulder Left Port  Result Date: 01/09/2019 CLINICAL DATA:  Postop EXAM: LEFT SHOULDER - 1 VIEW COMPARISON:  01/05/2019 FINDINGS: Interval surgical plate and multiple screw fixation of the left humerus for pathologic proximal  humerus fracture. Near anatomic alignment. No humeral head dislocation. Numerous lytic and sclerotic lesions consistent with metastatic disease. Amorphous opacities the proximal humerus at the level of the fracture, presumed to represent surgical packing material. IMPRESSION: Interval surgical plate and multiple screw fixation of proximal left humerus fracture with expected operative changes. Numerous lytic and sclerotic osseous lesions, consistent with skeletal metastatic disease Electronically Signed   By: Donavan Foil M.D.   On: 01/09/2019 21:13   Dg Abd Portable 1v  Result Date: 01/10/2019 CLINICAL DATA:  OG tube placement. EXAM: PORTABLE ABDOMEN - 1 VIEW COMPARISON:  None. FINDINGS: Tip and side port of the enteric tube below the diaphragm in the stomach. Relative paucity of upper abdominal bowel gas. No evidence of free air or obstruction. IMPRESSION: Tip and side port of the enteric tube below the diaphragm in the stomach. Electronically Signed   By: Keith Rake M.D.   On: 01/10/2019 01:07   Dg Humerus Left  Result Date: 01/05/2019 CLINICAL DATA:  Known left humeral fracture, continued pain EXAM: LEFT HUMERUS - 2+ VIEW COMPARISON:  Radiograph 12/20/2018 FINDINGS: Redemonstration of the diffusely mottled sclerotic and lytic appearance of the bones compatible with widespread osseous metastatic disease. Redemonstration of the pathologic fracture through the large lytic lesion in the proximal left humeral diaphysis. There is similar to slightly increased valgus angulation across the fracture. No new fracture is evident. Diffuse soft tissue swelling is present. Multiple pathologic left rib fractures are again seen within the included portion of the chest wall. Coarse interstitial changes present in the left lung are similar to prior with small left effusion. IMPRESSION: Redemonstration of widespread osseous metastatic disease Pathologic fracture of the proximal left humerus with similar to slightly  increased valgus angulation Multiple pathologic left rib fractures. Diffuse interstitial changes and patchy consolidation in the lungs. Suspect trace left effusion. Electronically Signed   By: Lovena Le M.D.   On: 01/05/2019 19:06   US Thoracentesis Asp Pleural Space W/img Guide  Result Date: 01/20/2019 INDICATION: Patient with history of metastatic breast cancer, recurrent malignant right pleural effusion. Request to IR for diagnostic and therapeutic right sided thoracentesis today. EXAM: ULTRASOUND GUIDED RIGHT THORACENTESIS MEDICATIONS: 10 mL 1% lidocaine COMPLICATIONS: None immediate. PROCEDURE: An ultrasound guided thoracentesis was thoroughly discussed  with the patient and questions answered. The benefits, risks, alternatives and complications were also discussed. The patient understands and wishes to proceed with the procedure. Written consent was obtained. Ultrasound was performed to localize and mark an adequate pocket of fluid in the right chest. The area was then prepped and draped in the normal sterile fashion. 1% Lidocaine was used for local anesthesia. Under ultrasound guidance a 6 Fr Safe-T-Centesis catheter was introduced. No pleural fluid was able to be aspirated. Patient with significant pain in her back and left shoulder which was present prior to the procedure and was unchanged during the procedure - she is unable to lay on her left side due to recent open reduction internal fixation of the left humerus. Due to significant pain patient requested that procedure be aborted. The catheter was removed and a dressing applied. FINDINGS: No pleural fluid obtained. IMPRESSION: Unsuccessful ultrasound guided right thoracentesis yielding no of pleural fluid. Read by Candiss Norse, PA-C Electronically Signed   By: Jacqulynn Cadet M.D.   On: 01/20/2019 15:54    Labs:  CBC: Recent Labs    01/29/19 1400 02/03/19 1621 02/04/19 0325 02/04/19 0539  WBC 10.6* 4.9 QUESTIONABLE RESULTS,  RECOMMEND RECOLLECT TO VERIFY 4.2  HGB 9.8* 9.8* QUESTIONABLE RESULTS, RECOMMEND RECOLLECT TO VERIFY 9.5*  HCT 32.3* 33.1* QUESTIONABLE RESULTS, RECOMMEND RECOLLECT TO VERIFY 32.0*  PLT 509* 456* QUESTIONABLE RESULTS, RECOMMEND RECOLLECT TO VERIFY 408*    COAGS: Recent Labs    10/27/18 0859 11/15/18 0543  INR 1.1 1.2    BMP: Recent Labs    01/29/19 1400 02/03/19 1621 02/04/19 0325 02/04/19 0539  NA 141 141 QUESTIONABLE RESULTS, RECOMMEND RECOLLECT TO VERIFY 139  K 3.2* 2.7* QUESTIONABLE RESULTS, RECOMMEND RECOLLECT TO VERIFY 2.9*  CL 94* 91* QUESTIONABLE RESULTS, RECOMMEND RECOLLECT TO VERIFY 92*  CO2 35* 35* QUESTIONABLE RESULTS, RECOMMEND RECOLLECT TO VERIFY 33*  GLUCOSE 140* 122* QUESTIONABLE RESULTS, RECOMMEND RECOLLECT TO VERIFY 127*  BUN 17 12 QUESTIONABLE RESULTS, RECOMMEND RECOLLECT TO VERIFY 15  CALCIUM 9.4 8.9 QUESTIONABLE RESULTS, RECOMMEND RECOLLECT TO VERIFY 8.9  CREATININE 0.75 0.83 QUESTIONABLE RESULTS, RECOMMEND RECOLLECT TO VERIFY 0.81  GFRNONAA >60 >60 QUESTIONABLE RESULTS, RECOMMEND RECOLLECT TO VERIFY >60  GFRAA >60 >60 QUESTIONABLE RESULTS, RECOMMEND RECOLLECT TO VERIFY >60    LIVER FUNCTION TESTS: Recent Labs    01/07/19 1014 01/11/19 1036 01/14/19 0905 01/29/19 1400  BILITOT 0.4 0.6 0.6 0.6  AST 18 15 13* 14*  ALT 11 8 6 7   ALKPHOS 227* 213* 201* 245*  PROT 5.9* 6.3* 6.5 7.4  ALBUMIN 2.6* 2.9* 2.7* 2.8*    TUMOR MARKERS: No results for input(s): AFPTM, CEA, CA199, CHROMGRNA in the last 8760 hours.  Assessment and Plan: Back pain T12 compression fracture Patient admitted with acute-on-chronic back pain.  She does have CT evidence of a T12 compression fracture.  She has been evaluated by Neurosurgery who recommends TSLO brace and conservative management.  Discussed patient's history and case with Dr. Laurence Ferrari who notes several possible barriers to procedure including patient's diffuse metastatic bony disease, history of several recent  infections, as well as limited visibility of bony landmarks by imaging.  Recommends complete lumbar spine series to determine whether procedure is technically feasible.  Patient is aware that vertebro/kyphoplasty procedure is being considered and that careful planning in underway.    Thank you for this interesting consult.  I greatly enjoyed meeting Suzanne [name] and look forward to participating in their care.  A copy of this report was sent  to the requesting provider on this date.  Electronically Signed: Docia Barrier, PA 02/04/2019, 3:42 PM   I spent a total of 40 Minutes    in face to face in clinical consultation, greater than 50% of which was counseling/coordinating care for T12 compression fracture.

## 2019-02-05 LAB — CBC
HCT: 29 % — ABNORMAL LOW (ref 36.0–46.0)
Hemoglobin: 8.6 g/dL — ABNORMAL LOW (ref 12.0–15.0)
MCH: 32.6 pg (ref 26.0–34.0)
MCHC: 29.7 g/dL — ABNORMAL LOW (ref 30.0–36.0)
MCV: 109.8 fL — ABNORMAL HIGH (ref 80.0–100.0)
Platelets: 423 10*3/uL — ABNORMAL HIGH (ref 150–400)
RBC: 2.64 MIL/uL — ABNORMAL LOW (ref 3.87–5.11)
RDW: 19.4 % — ABNORMAL HIGH (ref 11.5–15.5)
WBC: 5.6 10*3/uL (ref 4.0–10.5)
nRBC: 0 % (ref 0.0–0.2)

## 2019-02-05 LAB — BASIC METABOLIC PANEL
Anion gap: 12 (ref 5–15)
BUN: 23 mg/dL — ABNORMAL HIGH (ref 6–20)
CO2: 32 mmol/L (ref 22–32)
Calcium: 8.4 mg/dL — ABNORMAL LOW (ref 8.9–10.3)
Chloride: 96 mmol/L — ABNORMAL LOW (ref 98–111)
Creatinine, Ser: 0.91 mg/dL (ref 0.44–1.00)
GFR calc Af Amer: >60 mL/min (ref >60)
GFR calc non Af Amer: >60 mL/min (ref >60)
Glucose, Bld: 154 mg/dL — ABNORMAL HIGH (ref 70–99)
Potassium: 4.3 mmol/L (ref 3.5–5.1)
Sodium: 140 mmol/L (ref 135–145)

## 2019-02-05 LAB — GLUCOSE, CAPILLARY: Glucose-Capillary: 162 mg/dL — ABNORMAL HIGH (ref 70–99)

## 2019-02-05 MED ORDER — ALUM & MAG HYDROXIDE-SIMETH 200-200-20 MG/5ML PO SUSP
30.0000 mL | Freq: Once | ORAL | Status: AC
  Administered 2019-02-05: 30 mL via ORAL
  Filled 2019-02-05: qty 30

## 2019-02-05 MED ORDER — LIDOCAINE VISCOUS HCL 2 % MT SOLN
15.0000 mL | Freq: Once | OROMUCOSAL | Status: AC
  Administered 2019-02-05: 15 mL via ORAL
  Filled 2019-02-05: qty 15

## 2019-02-05 MED ORDER — CHLORPROMAZINE HCL 25 MG PO TABS
25.0000 mg | ORAL_TABLET | Freq: Once | ORAL | Status: AC
  Administered 2019-02-05: 25 mg via ORAL
  Filled 2019-02-05: qty 1

## 2019-02-05 NOTE — Progress Notes (Signed)
OT Cancellation Note  Patient Details Name: Suzanne [name] MRN: [id number] DOB: [birth year]   Cancelled Treatment:    Reason Eval/Treat Not Completed: Pain limiting ability to participate.  Pt's pain not controlled enough for OT eval at this time. Will return in the morning.  Jerrod Damiano 02/05/2019, 3:02 PM  Lesle Chris, OTR/L Acute Rehabilitation Services [phone] [facility] pager [phone] office 02/05/2019

## 2019-02-05 NOTE — TOC Initial Note (Signed)
Transition of Care [facility]) - Initial/Assessment Note    Patient Details  Name: Suzanne [name] MRN: [id number] Date of Birth: 05-Nov-1960  Transition of Care [facility]) CM/SW Contact:    Wende Neighbors, LCSW Phone Number: 02/05/2019, 1:51 PM  Clinical Narrative:   CSW met patient at bedside to discuss discharge plans. Patient lives at home with her adult daughter. Patient was tearful during conversation with CSW. Patient aware that the safest discharge for her would be to discharge to a SNF but patient stated she does not want to be away from daughter but knows it will benefit her in the long run. CSW went over the process of SNF and also spoke with patient about having to stay at a rehab facility for no less than 30 days due to her insurance. Patient stated she understands and will use that time to get better so she can come. Patient stated that she hopes CSW can find a rehab facility that is in the [place] area because she does not want to be to far from her daughter. CSW stated she will try her best. CSW to follow up with patient once bed offer is available                 Expected Discharge Plan: [place] Barriers to Discharge: Continued Medical Work up, SNF Pending bed offer   Patient Goals and CMS Choice Patient states their goals for this hospitalization and ongoing recovery are:: to be home with daughter CMS Medicare.gov Compare Post Acute Care list provided to:: Patient Choice offered to / list presented to : Patient  Expected Discharge Plan and Services Expected Discharge Plan: [place] In-house Referral: Clinical Social Work     Living arrangements for the past 2 months: Single Family Home Expected Discharge Date: 02/05/19                                    Prior Living Arrangements/Services Living arrangements for the past 2 months: Single Family Home Lives with:: Self, Adult Children Patient language and need for interpreter  reviewed:: Yes Do you feel safe going back to the place where you live?: Yes      Need for Family Participation in Patient Care: Yes (Comment) Care giver support system in place?: Yes (comment)   Criminal Activity/Legal Involvement Pertinent to Current Situation/Hospitalization: No - Comment as needed  Activities of Daily Living Home Assistive Devices/Equipment: Sling (specify type), Cane (specify quad or straight) ADL Screening (condition at time of admission) Patient's cognitive ability adequate to safely complete daily activities?: Yes Is the patient deaf or have difficulty hearing?: Yes Does the patient have difficulty seeing, even when wearing glasses/contacts?: No Does the patient have difficulty concentrating, remembering, or making decisions?: Yes Patient able to express need for assistance with ADLs?: Yes Does the patient have difficulty dressing or bathing?: Yes Independently performs ADLs?: No Communication: Independent, Appropriate for developmental age Is this a change from baseline?: Pre-admission baseline Dressing (OT): Needs assistance Is this a change from baseline?: Pre-admission baseline Grooming: Needs assistance Is this a change from baseline?: Pre-admission baseline Feeding: Independent Is this a change from baseline?: Pre-admission baseline Bathing: Needs assistance Is this a change from baseline?: Pre-admission baseline Toileting: Needs assistance Is this a change from baseline?: Change from baseline, expected to last >3days In/Out Bed: Needs assistance Is this a change from baseline?: Pre-admission baseline Walks in Home: Needs assistance,  Independent with device (comment) Is this a change from baseline?: Pre-admission baseline Does the patient have difficulty walking or climbing stairs?: Yes Weakness of Legs: Both Weakness of Arms/Hands: Both  Permission Sought/Granted Permission sought to share information with : Family Supports Permission granted to  share information with : Yes, Verbal Permission Granted  Share Information with NAME: Danne Scardina     Permission granted to share info w Relationship: daughter  Permission granted to share info w Contact Information: [phone]  Emotional Assessment Appearance:: Appears stated age Attitude/Demeanor/Rapport: Engaged, Crying Affect (typically observed): Accepting, Tearful/Crying, Pleasant Orientation: : Oriented to Self, Oriented to Place, Oriented to  Time, Oriented to Situation Alcohol / Substance Use: Not Applicable Psych Involvement: No (comment)  Admission diagnosis:  Metastatic breast cancer ([place]) [C50.919] Burst fracture of twelfth thoracic vertebra [facility]) [S22.081A] Patient Active Problem List   Diagnosis Date Noted  . Intractable back pain 02/03/2019  . Thoracic compression fracture, closed, initial encounter ([place]) 02/03/2019  . Essential hypertension 02/03/2019  . Genetic testing 01/27/2019  . Acute pharyngitis   . Bacteremia   . FTT (failure to thrive) in adult   . Fracture closed, humerus, shaft 01/09/2019  . Pneumonia 12/25/2018  . Acute respiratory disorder in immunocompromised patient ([place]) 12/25/2018  . Bone metastases ([place]) 12/24/2018  . Family history of breast cancer   . Family history of prostate cancer   . Family history of melanoma   . Family history of colon cancer   . Pressure injury of skin 11/24/2018  . Chronic respiratory failure with hypoxia and hypercapnia ([place]) 11/19/2018  . 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 Hypophosphatemia 11/19/2018  . Hypocalcemia 11/19/2018  . Aspiration pneumonia ([place]) 11/19/2018  . SOB (shortness of breath)   . Palliative care by specialist   . Carcinoma of breast, estrogen receptor positive, stage 4, right ([place]) 11/16/2018  . Hypoalbuminemia 11/16/2018  . Pleural effusion 11/14/2018  . Goals of care, counseling/discussion 11/06/2018  . Malignant neoplasm of overlapping sites of right breast in female,  estrogen receptor positive ([place]) 10/31/2018  . Lung metastasis ([place]) 10/31/2018  . Pain from bone metastases ([place]) 10/31/2018  . Morbid obesity with BMI of 40.0-44.9, adult ([place]) 10/31/2018  . Bone injury   . Pleural effusion, bilateral   . Shortness of breath   . Abnormal breast finding   . Hypokalemia   . Breast skin changes   . Macrocytic anemia   . Bone metastasis ([place])   . Acute anemia 10/26/2018   PCP:  Patient, No Pcp Per Pharmacy:   [facility] [place] [place] [address] AT [place] [address] Phone: [phone] Fax: [place] [address], [place] [address] Phone: [phone] Fax: [phone]  [id number] [address] [id number]: [phone]: [address] [place]Phone: [phone]: [phone]    Social Determinants of Health (SDOH) Interventions    Readmission Risk Interventions Readmission Risk Prevention Plan 01/16/2019 12/01/2018 11/17/2018  Transportation Screening Complete Complete Complete  PCP or Specialist Appt within 3-5 Days - - Not Complete  Not Complete comments - - will need pcp set up  [facility] - - Complete  Social Work Consult for Recovery Care Planning/Counseling - - Complete  Palliative Care Screening - - Not Complete  Palliative Care Screening Not Complete Comments - - pending- bedside RN states plan to consult palliative this admission  Medication Review (RN Care Manager) Complete Complete Complete  PCP or Specialist appointment within 3-5 days of discharge Complete Complete -  [place] or Home Care Consult Complete Complete -  SW Recovery Care/Counseling Consult  Complete Complete -  Palliative Care Screening Not Applicable Not Applicable -  Skilled Nursing Facility Complete Complete -  Some recent data might be hidden

## 2019-02-05 NOTE — Progress Notes (Signed)
Pt is alert and oriented. Complained of heart burn and hiccups. On call provider is paged, orders received.

## 2019-02-05 NOTE — Progress Notes (Signed)
PROGRESS NOTE    Suzanne [name]  [id number] DOB: [birth year] DOA: 02/03/2019 PCP: Patient, No Pcp Per    Brief Narrative:  58 y.o. 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  The patient reports several days of pain in the mid to upper back that has been rapidly worsening and severe to the point where she is unable to move without exacerbating the pain.  She denied any incontinence, weakness, or paresthesias associated with this.  She has not noted any fevers.  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  Her treatment is also been complicated by anemia requiring transfusions.  She was discharged from the hospital just over a week ago and SNF was recommended at that time, but due to lack of bed availability, she went home with home health.  She reports having adequate social support and states that her daughter works very hard to help her at home, but the patient is concerned that with her increase independence for basic ADLs, she will require more assistance than her daughter can provide. She denies any change in her chronic cough and SOB, reports starting azithromycin a couple days ago due to elevated WBC.   Assessment & Plan:   Principal Problem:   Intractable back pain Active Problems:   Hypokalemia   Macrocytic anemia   Malignant neoplasm of overlapping sites of right breast in female, estrogen receptor positive (HCC)   Pain from bone metastases (HCC)   Chronic respiratory failure with hypoxia and hypercapnia (HCC)   Thoracic compression fracture, closed, initial encounter ([place])   Essential hypertension  1. Intractable back pain; diffuse  osseous metastatic disease  - Presents with several days of severe back pain despite using oxycodone at home, denies inciting event, denies weakness/paresthesia/incontinence, and is found to have diffuse osseous metastatic disease on CT with subacute or acute T12 compression deformity   - Neurosurgery was consulted by ED physician, recs noted. No need for surgery at this time - Oncology recommended IR consult for consideration for kyphoplasty. IR does not consider pt a good candidate for kyphoplasty - Pt seen by PT. Per therapy, "Pt currently with functional limitations due to" multiple presenting deficits and "will benefit from skilled PT to increase their independence and safety with mobility to allow discharge." PT also reports an "Inaccessible home environment" with "decreased caregiver support," thus not safe for discharge yet - Pain relatively controlled this AM  2. Metastatic breast cancer  - Presented in May 2020 with diffuse metastases at that time  - She is being treated with anastrozole, palbociclib, and Xgeva  - Oncology following. Recs noted  3. Hypokalemia  - Serum potassium was 2.7 in ED   - Corrected - Repeat bmet in AM  4. Hypertension  - BP at goal, continue Coreg  - Currently stable  5. Chronic hypoxic respiratory failure  - Attributed to malignant pleural effusions and stable on 2 Lpm with no acute respiratory complaints  - Presently stable  6. Macrocytic anemia - Hgb is 9.8 on admission  - Appears to be stable with no bleeding, likely secondary to chemotherapy  - Currently stable  7. Debility  - SNF was recommended at time of recent hospital discharge but no  beds were available and patient went home with home health  - Unfortunately, her condition has continued to decline and patient does not feel that her daughter will be able to provide all the assistance she now requires despite her best efforts   -PT following, recommendation for SNF per above  DVT  prophylaxis: Lovenox subQ Code Status: Full Family Communication: Pt in room, family at bedside Disposition Plan: SNF, timing uncertain  Consultants:   Oncology  Neurosurgery  IR  Procedures:     Antimicrobials: Anti-infectives (From admission, onward)   Start     Dose/Rate Route Frequency Ordered Stop   02/04/19 2200  azithromycin (ZITHROMAX) tablet 250 mg  Status:  Discontinued    Note to Pharmacy: 2 tab on day 1 then 1 tab daily until complete     250 mg Oral Daily at bedtime 02/03/19 2128 02/04/19 0907   02/03/19 2130  azithromycin (ZITHROMAX) tablet 500 mg    Note to Pharmacy: 2 tab on day 1 then 1 tab daily until complete     500 mg Oral NOW 02/03/19 1947 02/04/19 0100      Subjective: States pain is better controlled today. Eager to go to rehab when available  Objective: Vitals:   02/04/19 1559 02/04/19 2016 02/05/19 0452 02/05/19 1300  BP: 131/67 118/75 106/68 116/68  Pulse: 99 97 74 66  Resp:  18 20 18   Temp:  97.6 F (36.4 C) (!) 97.4 F (36.3 C) 98.6 F (37 C)  TempSrc:  Oral Oral Oral  SpO2:  98% 99% 95%  Weight:      Height:        Intake/Output Summary (Last 24 hours) at 02/05/2019 1758 Last data filed at 02/05/2019 1130 Gross per 24 hour  Intake 120 ml  Output 550 ml  Net -430 ml   Filed Weights   02/04/19 0336  Weight: 76.5 kg    Examination: General exam: Awake, laying in bed, in nad Respiratory system: Normal respiratory effort, no wheezing Cardiovascular system: regular rate, s1, s2 Gastrointestinal system: Soft, nondistended, positive BS Central nervous system: CN2-12 grossly intact, strength intact Extremities: Perfused, no clubbing Skin: Normal skin turgor, no notable skin lesions seen Psychiatry: Mood normal // no visual hallucinations   Data Reviewed: I have personally reviewed following labs and imaging studies  CBC: Recent Labs  Lab 02/03/19 1621 02/04/19 0325 02/04/19 0539 02/05/19 0840  WBC 4.9 QUESTIONABLE  RESULTS, RECOMMEND RECOLLECT TO VERIFY 4.2 5.6  NEUTROABS 3.2 QUESTIONABLE RESULTS, RECOMMEND RECOLLECT TO VERIFY 3.1  --   HGB 9.8* QUESTIONABLE RESULTS, RECOMMEND RECOLLECT TO VERIFY 9.5* 8.6*  HCT 33.1* QUESTIONABLE RESULTS, RECOMMEND RECOLLECT TO VERIFY 32.0* 29.0*  MCV 106.1* QUESTIONABLE RESULTS, RECOMMEND RECOLLECT TO VERIFY 108.5* 109.8*  PLT 456* QUESTIONABLE RESULTS, RECOMMEND RECOLLECT TO VERIFY 408* [id number]*   Basic Metabolic Panel: Recent Labs  Lab 02/03/19 1621 02/04/19 0325 02/04/19 0539 02/05/19 0840  NA 141 QUESTIONABLE RESULTS, RECOMMEND RECOLLECT TO VERIFY 139 140  K 2.7* QUESTIONABLE RESULTS, RECOMMEND RECOLLECT TO VERIFY 2.9* 4.3  CL 91* QUESTIONABLE RESULTS, RECOMMEND RECOLLECT TO VERIFY 92* 96*  CO2 35* QUESTIONABLE RESULTS, RECOMMEND RECOLLECT TO VERIFY 33* 32  GLUCOSE 122* QUESTIONABLE RESULTS, RECOMMEND RECOLLECT TO VERIFY 127* 154*  BUN 12 QUESTIONABLE RESULTS, RECOMMEND RECOLLECT TO VERIFY 15 23*  CREATININE 0.83 QUESTIONABLE RESULTS, RECOMMEND RECOLLECT TO VERIFY 0.81 0.91  CALCIUM 8.9 QUESTIONABLE RESULTS, RECOMMEND RECOLLECT TO VERIFY 8.9 8.4*  MG  --  QUESTIONABLE RESULTS, RECOMMEND RECOLLECT TO VERIFY 2.8*  --  GFR: Estimated Creatinine Clearance: 64.3 mL/min (by C-G formula based on SCr of 0.91 mg/dL). Liver Function Tests: No results for input(s): AST, ALT, ALKPHOS, BILITOT, PROT, ALBUMIN in the last 168 hours. No results for input(s): LIPASE, AMYLASE in the last 168 hours. No results for input(s): AMMONIA in the last 168 hours. Coagulation Profile: No results for input(s): INR, PROTIME in the last 168 hours. Cardiac Enzymes: No results for input(s): CKTOTAL, CKMB, CKMBINDEX, TROPONINI in the last 168 hours. BNP (last 3 results) No results for input(s): PROBNP in the last 8760 hours. HbA1C: No results for input(s): HGBA1C in the last 72 hours. CBG: Recent Labs  Lab 02/04/19 0741 02/05/19 0739  GLUCAP 127* 162*   Lipid Profile: No results  for input(s): CHOL, HDL, LDLCALC, TRIG, CHOLHDL, LDLDIRECT in the last 72 hours. Thyroid Function Tests: No results for input(s): TSH, T4TOTAL, FREET4, T3FREE, THYROIDAB in the last 72 hours. Anemia Panel: No results for input(s): VITAMINB12, FOLATE, FERRITIN, TIBC, IRON, RETICCTPCT in the last 72 hours. Sepsis Labs: No results for input(s): PROCALCITON, LATICACIDVEN in the last 168 hours.  Recent Results (from the past 240 hour(s))  SARS Coronavirus 2 [facility] order, Performed in [facility] hospital lab) Nasopharyngeal Nasopharyngeal Swab     Status: None   Collection Time: 02/03/19  7:22 PM   Specimen: Nasopharyngeal Swab  Result Value Ref Range Status   SARS Coronavirus 2 NEGATIVE NEGATIVE Final    Comment: (NOTE) If result is NEGATIVE SARS-CoV-2 target nucleic acids are NOT DETECTED. The SARS-CoV-2 RNA is generally detectable in upper and lower  respiratory specimens during the acute phase of infection. The lowest  concentration of SARS-CoV-2 viral copies this assay can detect is 250  copies / mL. A negative result does not preclude SARS-CoV-2 infection  and should not be used as the sole basis for treatment or other  patient management decisions.  A negative result may occur with  improper specimen collection / handling, submission of specimen other  than nasopharyngeal swab, presence of viral mutation(s) within the  areas targeted by this assay, and inadequate number of viral copies  (<250 copies / mL). A negative result must be combined with clinical  observations, patient history, and epidemiological information. If result is POSITIVE SARS-CoV-2 target nucleic acids are DETECTED. The SARS-CoV-2 RNA is generally detectable in upper and lower  respiratory specimens dur ing the acute phase of infection.  Positive  results are indicative of active infection with SARS-CoV-2.  Clinical  correlation with patient history and other diagnostic information is  necessary to determine  patient infection status.  Positive results do  not rule out bacterial infection or co-infection with other viruses. If result is PRESUMPTIVE POSTIVE SARS-CoV-2 nucleic acids MAY BE PRESENT.   A presumptive positive result was obtained on the submitted specimen  and confirmed on repeat testing.  While 2019 novel coronavirus  (SARS-CoV-2) nucleic acids may be present in the submitted sample  additional confirmatory testing may be necessary for epidemiological  and / or clinical management purposes  to differentiate between  SARS-CoV-2 and other Sarbecovirus currently known to infect humans.  If clinically indicated additional testing with an alternate test  methodology [phone]) is advised. The SARS-CoV-2 RNA is generally  detectable in upper and lower respiratory sp ecimens during the acute  phase of infection. The expected result is Negative. Fact Sheet for Patients:  StrictlyIdeas.no Fact Sheet for Healthcare Providers: BankingDealers.co.za This test is not yet approved or cleared by the [place] FDA and has  been authorized for detection and/or diagnosis of SARS-CoV-2 by FDA under an Emergency Use Authorization (EUA).  This EUA will remain in effect (meaning this test can be used) for the duration of the COVID-19 declaration under Section 564(b)(1) of the Act, 21 U.S.C. section 360bbb-3(b)(1), unless the authorization is terminated or revoked sooner. Performed at [facility], [place] [address], [place], [place] [address]      Radiology Studies: Dg Lumbar Spine Complete  Result Date: 02/04/2019 CLINICAL DATA:  Back pain EXAM: LUMBAR SPINE - COMPLETE 4+ VIEW COMPARISON:  CT 02/03/2019 FINDINGS: Diffuse sclerosis and lytic process, consistent with skeletal metastatic disease. Acute kyphosis centered at T11-T12 with moderate compression deformities at T11 and T12, loss of the intervertebral disc space. Remaining lumbar  vertebra demonstrate normal stature. Degenerative changes at L1-L2. Aortic atherosclerosis. Right L1 transverse process fracture is better seen on the recent CT. IMPRESSION: Extensive, diffuse skeletal metastatic disease. Focal kyphosis centered at T11-T12 with compression deformities at T11 and T12. Electronically Signed   By: Donavan Foil M.D.   On: 02/04/2019 19:31    Scheduled Meds: . anastrozole  1 mg Oral Daily  . carvedilol  3.125 mg Oral BID WC  . dexamethasone (DECADRON) injection  2 mg Intravenous Q12H  . enoxaparin (LOVENOX) injection  40 mg Subcutaneous QHS  . feeding supplement (ENSURE ENLIVE)  237 mL Oral BID BM  . feeding supplement (PRO-STAT SUGAR FREE 64)  30 mL Oral TID BM  . gabapentin  100 mg Oral BID  . lidocaine  1 patch Transdermal Q24H  . multivitamin with minerals  1 tablet Oral Daily  . nystatin   Topical TID  . palbociclib  100 mg Oral Daily  . senna  1 tablet Oral BID  . sodium chloride flush  3 mL Intravenous Q12H   Continuous Infusions: . sodium chloride Stopped (02/04/19 0300)     LOS: 1 day   Marylu Lund, MD [facility] Pager On Amion  If 7PM-7AM, please contact night-coverage 02/05/2019, 5:58 PM

## 2019-02-05 NOTE — Progress Notes (Addendum)
HEMATOLOGY-ONCOLOGY PROGRESS NOTE  SUBJECTIVE: Suzanne [name]'s pain is controlled with oral pain medication.  She was able to sit up on the side of the bed yesterday but did not get out of the bed to the chair or ambulate in the room/hallway.  Awaiting further input from interventional radiology regarding possible kyphoplasty.  No difficulty controlling her bowel or bladder.  No numbness or loss of sensation in her lower extremities.  Tolerating Ibrance and anastrozole well.  REVIEW OF SYSTEMS:   Constitutional: Denies fevers, chills or abnormal weight loss Eyes: Denies blurriness of vision Ears, nose, mouth, throat, and face: Denies mucositis or sore throat Respiratory: Denies cough, dyspnea or wheezes Cardiovascular: Denies palpitation, chest discomfort Gastrointestinal:  Denies nausea, heartburn or change in bowel habits Skin: Denies abnormal skin rashes Lymphatics: Denies new lymphadenopathy or easy bruising Neurological:Denies numbness, tingling or new weaknesses Behavioral/Psych: Mood is stable, no new changes  Extremities: No lower extremity edema MSK: Back pain improved with oral pain medication All other systems were reviewed with the patient and are negative.  I have reviewed the past medical history, past surgical history, social history and family history with the patient and they are unchanged from previous note.   PHYSICAL EXAMINATION: ECOG PERFORMANCE STATUS: 2 - Symptomatic, <50% confined to bed  Vitals:   02/04/19 2016 02/05/19 0452  BP: 118/75 106/68  Pulse: 97 74  Resp: 18 20  Temp: 97.6 F (36.4 C) (!) 97.4 F (36.3 C)  SpO2: 98% 99%   Filed Weights   02/04/19 0336  Weight: 168 lb 10.4 oz (76.5 kg)    Intake/Output from previous day: 09/09 0701 - 09/10 0700 In: 340 [P.O.:240; IV Piggyback:100] Out: 850 [Urine:850]  GENERAL:alert, no distress and comfortable SKIN: skin color, texture, turgor are normal, no rashes or significant lesions EYES: normal,  Conjunctiva are pink and non-injected, sclera clear OROPHARYNX:no exudate, no erythema and lips, buccal mucosa, and tongue normal  NECK: supple, thyroid normal size, non-tender, without nodularity LYMPH:  no palpable lymphadenopathy in the cervical, axillary or inguinal LUNGS: clear to auscultation and percussion with normal breathing effort HEART: regular rate & rhythm and no murmurs and no lower extremity edema ABDOMEN:abdomen soft, non-tender and normal bowel sounds Musculoskeletal: Spinal tenderness over the thoracic spine NEURO: alert & oriented x 3 with fluent speech, no focal motor/sensory deficits  LABORATORY DATA:  I have reviewed the data as listed CMP Latest Ref Rng & Units 02/05/2019 02/04/2019 02/04/2019  Glucose 70 - 99 mg/dL 154(H) 127(H) QUESTIONABLE RESULTS, RECOMMEND RECOLLECT TO VERIFY  BUN 6 - 20 mg/dL 23(H) 15 QUESTIONABLE RESULTS, RECOMMEND RECOLLECT TO VERIFY  Creatinine 0.44 - 1.00 mg/dL 0.91 0.81 QUESTIONABLE RESULTS, RECOMMEND RECOLLECT TO VERIFY  Sodium 135 - 145 mmol/L 140 139 QUESTIONABLE RESULTS, RECOMMEND RECOLLECT TO VERIFY  Potassium 3.5 - 5.1 mmol/L 4.3 2.9(L) QUESTIONABLE RESULTS, RECOMMEND RECOLLECT TO VERIFY  Chloride 98 - 111 mmol/L 96(L) 92(L) QUESTIONABLE RESULTS, RECOMMEND RECOLLECT TO VERIFY  CO2 22 - 32 mmol/L 32 33(H) QUESTIONABLE RESULTS, RECOMMEND RECOLLECT TO VERIFY  Calcium 8.9 - 10.3 mg/dL 8.4(L) 8.9 QUESTIONABLE RESULTS, RECOMMEND RECOLLECT TO VERIFY  Total Protein 6.5 - 8.1 g/dL - - -  Total Bilirubin 0.3 - 1.2 mg/dL - - -  Alkaline Phos 38 - 126 U/L - - -  AST 15 - 41 U/L - - -  ALT 0 - 44 U/L - - -    Lab Results  Component Value Date   WBC 5.6 02/05/2019   HGB 8.6 (L) 02/05/2019  HCT 29.0 (L) 02/05/2019   MCV 109.8 (H) 02/05/2019   PLT 423 (H) 02/05/2019   NEUTROABS 3.1 02/04/2019    Dg Chest 1 View  Result Date: 01/20/2019 CLINICAL DATA:  Status post right thoracentesis. History of metastatic breast cancer EXAM: CHEST  1 VIEW  COMPARISON:  01/17/2019 FINDINGS: Stable cardiomediastinal contours. Decrease right-sided pleural effusion. Lung volumes are low. No appreciable pneumothorax. A right-sided PICC line is present. Surgical fixation hardware in the proximal left humerus. Extensive osseous metastatic disease throughout the visualized thorax. IMPRESSION: Interval decrease in a right-sided pleural effusion. No pneumothorax identified. Electronically Signed   By: Davina Poke M.D.   On: 01/20/2019 15:54   Dg Lumbar Spine Complete  Result Date: 02/04/2019 CLINICAL DATA:  Back pain EXAM: LUMBAR SPINE - COMPLETE 4+ VIEW COMPARISON:  CT 02/03/2019 FINDINGS: Diffuse sclerosis and lytic process, consistent with skeletal metastatic disease. Acute kyphosis centered at T11-T12 with moderate compression deformities at T11 and T12, loss of the intervertebral disc space. Remaining lumbar vertebra demonstrate normal stature. Degenerative changes at L1-L2. Aortic atherosclerosis. Right L1 transverse process fracture is better seen on the recent CT. IMPRESSION: Extensive, diffuse skeletal metastatic disease. Focal kyphosis centered at T11-T12 with compression deformities at T11 and T12. Electronically Signed   By: Donavan Foil M.D.   On: 02/04/2019 19:31   Ct Thoracic Spine Wo Contrast  Result Date: 02/03/2019 CLINICAL DATA:  Mid back pain/T-spine pain, initial exam. EXAM: CT THORACIC SPINE WITHOUT CONTRAST TECHNIQUE: Multidetector CT images of the thoracic were obtained using the standard protocol without intravenous contrast. COMPARISON:  CT angiography chest 12/25/2018 FINDINGS: Alignment: Focal kyphosis centered at the T11-T12 level. Trace T12 bony retropulsion. Vertebrae: Redemonstrated marked diffuse osseous heterogeneity with multifocal sclerosis and lytic changes. Findings are consistent with diffuse osseous metastatic disease. New from prior chest CT 12/25/2018, there is a T12 anterior wedge compression deformity with approximately 50%  loss of vertebral body height and trace bony retropulsion. Mild loss of T11 vertebral body height is also questioned, and this was not definitively present on prior examination. Widening of the T10-T11 and T11-T12 interspinous spaces appear similar to prior exam. Trace T12 bony retropulsion and T11-T12 gibbus deformity results in mild to moderate bony narrowing of the spinal canal at the T11-T12 level. At the remaining levels, no high-grade bony spinal canal stenosis or neural foraminal narrowing. Vertebral body height is otherwise maintained. No other definite acute fracture to the thoracic spine is identified. Redemonstrated are multiple bilateral subacute pathologic rib fractures. Paraspinal and other soft tissues: Soft tissue prominence surrounding the T12 vertebra, which may reflect extra ertebral extension of metastatic disease. Small bilateral pleural effusion are similar to prior examination. Redemonstrated bilateral bandlike opacities which may reflect scar or fibrosis. Hiatal hernia containing what appears to be enteric contrast. Cholecystolithiasis Disc levels: Multilevel loss of intervertebral disc height. Thoracic spondylosis. Apart from mild to moderate narrowing at the T11-T12 level related to focal gibbus deformity and trace T12 bony retropulsion, no high-grade bony spinal canal stenosis or neural foraminal narrowing at the remaining levels. IMPRESSION: Redemonstrated diffuse osseous metastatic disease. New from prior chest CT 12/25/2018, there is an acute/subacute T12 compression deformity with approximately 50% loss of vertebral body height and trace bony retropulsion. Mild loss of T11 vertebral body height is also questioned, and new from prior examination. Resultant focal kyphosis at the T11-T12 level with mild to moderate spinal canal narrowing. Widening of the T10-T11 and T12-L1 interspinous spaces appears similar to prior examination, but MRI may be  obtained for further evaluation to  definitively exclude ligamentous injury. Soft tissue prominence surrounding the T12 vertebra which may reflect extra vertebral extension of metastatic disease. Consider contrast-enhanced MRI for further evaluation, as clinically warranted. No other definite acute fracture to the thoracic spine. Redemonstrated are multiple nonacute bilateral pathologic rib fractures. Small bilateral pleural effusions. Hiatal hernia Cholecystolithiasis. Electronically Signed   By: Kellie Simmering   On: 02/03/2019 18:13   Ct Lumbar Spine Wo Contrast  Result Date: 02/03/2019 CLINICAL DATA:  Back pain, osteoporosis and chronic steroid use. EXAM: CT LUMBAR SPINE WITHOUT CONTRAST TECHNIQUE: Multidetector CT imaging of the lumbar spine was performed without intravenous contrast administration. Multiplanar CT image reconstructions were also generated. COMPARISON:  CT angiography chest 12/25/2018, FINDINGS: Segmentation: For the purposes of this examination, 5 lumbar vertebrae are assumed and the caudal most well-formed intervertebral disc is designated L5-S1. Alignment: Trace L1-L2 and L2-L3 grade 1 retrolisthesis. Vertebrae: There is marked diffuse osseous heterogeneity with multifocal sclerotic and lytic changes. Findings are consistent with diffuse osseous metastatic disease. No lumbar compression deformity. Subacute appearing fracture of the right L1 transverse process. No other definite fracture of the lumbar spine is identified. Subacute appearing fracture of the posterior left twelfth rib. Paraspinal and other soft tissues: The imaged paraspinal soft tissues are unremarkable. Aortoiliac atherosclerotic disease. Disc levels: Moderate L1-L2 disc degeneration. Intervertebral disc height is otherwise preserved within the lumbar spine. Lumbar spondylosis without high-grade bony spinal canal or neural foraminal stenosis. IMPRESSION: Findings consistent with diffuse osseous metastatic disease. No lumbar compression deformity. Subacute  appearing fracture of the right L1 transverse process. No other definite fracture of the lumbar spine is identified. Subacute appearing fracture of the posterior left twelfth rib. Electronically Signed   By: Kellie Simmering   On: 02/03/2019 17:48   Dg Chest Port 1 View  Result Date: 01/17/2019 CLINICAL DATA:  Shortness of breath, cough, metastatic breast cancer, malignant pleural effusion EXAM: PORTABLE CHEST 1 VIEW COMPARISON:  01/13/2019 FINDINGS: No significant change in AP portable examination with large right, moderate left pleural effusions and associated atelectasis or consolidation. No new airspace opacity. The heart and mediastinum are predominantly obscured. Extensive sclerotic osseous metastatic disease with plate and screw fixation of the proximal left humerus. Right upper extremity PICC. IMPRESSION: No significant change in AP portable examination with large right, moderate left pleural effusions and associated atelectasis or consolidation. No new airspace opacity. The heart and mediastinum are predominantly obscured. Extensive sclerotic osseous metastatic disease with plate and screw fixation of the proximal left humerus. Right upper extremity PICC. Electronically Signed   By: Eddie Candle M.D.   On: 01/17/2019 14:42   Dg Chest Port 1 View  Result Date: 01/13/2019 CLINICAL DATA:  Aspiration into respiratory tract. Acute on chronic respiratory failure. EXAM: PORTABLE CHEST 1 VIEW COMPARISON:  Multiple prior exams most recently earlier this day. FINDINGS: Patient's chin obscures the apices. Right upper extremity PICC with tip unchanged. Progressive hazy opacity at the lung bases likely pleural effusions. Increasing vascular congestion. Grossly unchanged heart size and mediastinal contours. No pneumothorax. Osseous metastatic disease with recent surgical fixation of the left proximal humerus. IMPRESSION: Progressive hazy opacity at the lung bases likely pleural effusions. Increasing vascular  congestion. Electronically Signed   By: Keith Rake M.D.   On: 01/13/2019 23:11   Dg Chest Port 1 View  Result Date: 01/13/2019 CLINICAL DATA:  Atelectasis EXAM: PORTABLE CHEST 1 VIEW COMPARISON:  01/10/2019 FINDINGS: Cardiac shadow is stable. Endotracheal tube is been removed as  has a gastric catheter. Right-sided PICC line remains in satisfactory position. Elevation of the right hemidiaphragm is again seen and stable. Bibasilar atelectatic changes are noted increased from the prior study. Postsurgical changes in the left shoulder are noted. IMPRESSION: Interval removal of tubes and lines. Slight increase in bibasilar atelectasis. Electronically Signed   By: Inez Catalina M.D.   On: 01/13/2019 07:20   Dg Chest Port 1 View  Result Date: 01/10/2019 CLINICAL DATA:  Intubation and OG tube placement. EXAM: PORTABLE CHEST 1 VIEW COMPARISON:  Radiograph yesterday. FINDINGS: Endotracheal tube tip 2.4 cm from the carina. Enteric tube tip below the diaphragm not included in the field of view. Tip of the right upper extremity PICC in the upper SVC. Low lung volumes persist with slight decrease patchy opacities from prior exam. Persistent blunting of both costophrenic angles. Unchanged heart size and mediastinal contours. Bony metastatic disease again seen. Recent left humeral fixation is partially included. IMPRESSION: 1. Endotracheal tube tip 2.4 cm from the carina. Enteric tube tip below the diaphragm not included in the field of view. 2. Persistent low lung volumes. Slight improvement in bilateral patchy opacities from prior exam. Electronically Signed   By: Keith Rake M.D.   On: 01/10/2019 01:06   Dg Chest Port 1 View  Result Date: 01/09/2019 CLINICAL DATA:  Shortness of breath. EXAM: PORTABLE CHEST 1 VIEW COMPARISON:  Radiograph 01/05/2019, chest CT 12/25/2018 FINDINGS: Very low lung volumes limit assessment, lower lung volumes from prior exam. Right upper extremity PICC tip in the SVC.  Cardiomediastinal contours are grossly stable, not well visualized due to low lung volumes. Progression in bilateral lung opacities, right perihilar and left lung base. Blunting of the costophrenic angles likely effusions. Osseous metastatic disease again seen. IMPRESSION: 1. Very low lung volumes, limited assessment lower lung volumes from prior exam. 2. Progression in bilateral lung opacities, right perihilar and left lung base, may be combination of pulmonary edema and pneumonia. Suspected pleural effusions. Electronically Signed   By: Keith Rake M.D.   On: 01/09/2019 22:14   Dg Shoulder Left Port  Result Date: 01/09/2019 CLINICAL DATA:  Postop EXAM: LEFT SHOULDER - 1 VIEW COMPARISON:  01/05/2019 FINDINGS: Interval surgical plate and multiple screw fixation of the left humerus for pathologic proximal humerus fracture. Near anatomic alignment. No humeral head dislocation. Numerous lytic and sclerotic lesions consistent with metastatic disease. Amorphous opacities the proximal humerus at the level of the fracture, presumed to represent surgical packing material. IMPRESSION: Interval surgical plate and multiple screw fixation of proximal left humerus fracture with expected operative changes. Numerous lytic and sclerotic osseous lesions, consistent with skeletal metastatic disease Electronically Signed   By: Donavan Foil M.D.   On: 01/09/2019 21:13   Dg Abd Portable 1v  Result Date: 01/10/2019 CLINICAL DATA:  OG tube placement. EXAM: PORTABLE ABDOMEN - 1 VIEW COMPARISON:  None. FINDINGS: Tip and side port of the enteric tube below the diaphragm in the stomach. Relative paucity of upper abdominal bowel gas. No evidence of free air or obstruction. IMPRESSION: Tip and side port of the enteric tube below the diaphragm in the stomach. Electronically Signed   By: Keith Rake M.D.   On: 01/10/2019 01:07   US Thoracentesis Asp Pleural Space W/img Guide  Result Date: 01/20/2019 INDICATION: Patient with  history of metastatic breast cancer, recurrent malignant right pleural effusion. Request to IR for diagnostic and therapeutic right sided thoracentesis today. EXAM: ULTRASOUND GUIDED RIGHT THORACENTESIS MEDICATIONS: 10 mL 1% lidocaine COMPLICATIONS: None immediate.  PROCEDURE: An ultrasound guided thoracentesis was thoroughly discussed with the patient and questions answered. The benefits, risks, alternatives and complications were also discussed. The patient understands and wishes to proceed with the procedure. Written consent was obtained. Ultrasound was performed to localize and mark an adequate pocket of fluid in the right chest. The area was then prepped and draped in the normal sterile fashion. 1% Lidocaine was used for local anesthesia. Under ultrasound guidance a 6 Fr Safe-T-Centesis catheter was introduced. No pleural fluid was able to be aspirated. Patient with significant pain in her back and left shoulder which was present prior to the procedure and was unchanged during the procedure - she is unable to lay on her left side due to recent open reduction internal fixation of the left humerus. Due to significant pain patient requested that procedure be aborted. The catheter was removed and a dressing applied. FINDINGS: No pleural fluid obtained. IMPRESSION: Unsuccessful ultrasound guided right thoracentesis yielding no of pleural fluid. Read by Candiss Norse, PA-C Electronically Signed   By: Jacqulynn Cadet M.D.   On: 01/20/2019 15:54    ASSESSMENT: 58 y.o. [place], [place] woman presenting 10/26/2018 with right-sided inflammatory breast cancer, stage IV, involvnh lungs, lymph nodes and bones, as follows:             (a) chest CT scan 10/26/2018 shows bilateral pleural effusions, possible lymphangitic spread of tumor, diffuse bony metastatic disease, and significant axillary mediastinal and hilar adenopathy             (b) bone scan 10/27/2018 is a "[place]" consistent with  widespread bony metastatic disease             (c) head CT with and without contrast 10/30/2018 shows no intracranial metastatic disease, multiple calvarial lesions             (d) CA-27-29 on 10/27/2018 was 1810.4  (1) pleural fluid from right thoracentesis 10/28/2018 confirms malignant cells consistent with a breast primary, strongly estrogen and progesterone receptor positive, HER-2 not amplified, with an MIB-1 of 2%  (2) anastrozole started 10/29/2018             (a) palbociclib to start 11/13/2018             (b) denosumab/xgeva to start 11/13/2018  (3) associated problems:             (a) hypoxia secondary to effusions             (b) pain from bone lesions             (c) right upper extremity lymphedema             (d) anemia, requiring transfusion             (e) poor venous access  (4) genetics testing pending  PLAN: Dorthea's back pain is overall improved.  She is taking oral pain medication.  Awaiting input from interventional radiology regarding possible kyphoplasty.  I have discussed this with the patient and have recommended that she proceed with kyphoplasty if it is recommended to her.  She is agreeable to proceeding if offered.  Continue anastrozole and Ibrance.  She has mild anemia likely related to her Ibrance.  We will continue to monitor this closely.  [name] is to be taking 3 weeks on and one-week off.  The patient thinks that she started her last cycle on 01/24/2019 so by my calculation, she should take her last dose of this cycle  on 02/13/2019.  I have recommended for the patient to work with PT/OT and for discharge to a skilled nursing facility for rehabilitation upon discharge.  The patient is agreeable to this and knows that she cannot go home in her current state as both her and her daughter have had difficulty taking care of her and she has had repeated hospital admissions.   LOS: 1 day   Mikey Bussing, DNP, AGPCNP-BC, AOCNP 02/05/19     ADDENDUM: Unfortunately interventional radiology does not feel they can proceed to kyphoplasty given the diffuse bony replacement by neoplastic disease and the fact that the patient's body habitus complicates visualization.  We are going to depend on pain medication for control of this problem.  I am hopeful the vertebral fracture and the associated pain medication will not interfere with her continuing although very slow progress and specifically her ability to proceed to physical therapy.  Would suggest starting naproxen 220 mg with Tylenol 500 mg 3 times daily as baseline, with gabapentin 50-100 4 times daily as needed, and only if she still has significant pain above that proceed with oxycodone.  I would try to avoid parenteral narcotics since these cannot be continued after discharge.  From a breast cancer point of view she is continuing on her palbociclib and anastrozole with good tolerance.  We will continue to follow with you.  I personally saw this patient and performed a substantive portion of this encounter with the listed APP documented above.   Chauncey Cruel, MD Medical Oncology and Hematology [facility] [address] [place], [place] [address] Tel. [phone]    Fax. [phone]

## 2019-02-05 NOTE — Progress Notes (Signed)
IR consulted by Dr. Mena Pauls for possible image-guided T12 kyphoplasty/vertebroplasty.  CT thoracic/lumbar spine 02/03/2019 reviewed by Dr. Laurence Ferrari who states that there is a significant concern that the combination of her body habitus and the diffuse bony replacement by neoplastic disease will result in an inability to visualize the bony landmarks necessary for a safe and successful cement augmentation. Recommended lumbar spine series of radiographs to assess visualization.  DG lumbar spine 02/04/2019 reviewed by Dr. Laurence Ferrari today who does not recommend procedure- proceeding with procedure would be unsafe given lack of visualization of landmarks due to combination of  patient's body habitus along with diffuse replacement of bone by metastasis. Will delete order. Mikey Bussing, NP made aware.  IR available in future if needed.   Bea Graff Taber Sweetser, PA-C 02/05/2019, 12:42 PM

## 2019-02-06 LAB — GLUCOSE, CAPILLARY: Glucose-Capillary: 129 mg/dL — ABNORMAL HIGH (ref 70–99)

## 2019-02-06 MED ORDER — MAGNESIUM CITRATE PO SOLN
1.0000 | Freq: Once | ORAL | Status: AC
  Administered 2019-02-06: 1 via ORAL
  Filled 2019-02-06: qty 296

## 2019-02-06 NOTE — NC FL2 (Signed)
[place] LEVEL OF CARE SCREENING TOOL     IDENTIFICATION  Patient Name: Suzanne [name] Birthdate: 1961/02/23 Sex: female Admission Date (Current Location): 02/03/2019  [place] and [place] Number:  Kathleen Argue [id number] [place] and Address:  [facility],  [place], [place]      Provider Number: [id number]  Attending Physician Name and Address:  Donne Hazel, MD  Relative Name and Phone Number:  [phone] Daughter [name]    Current Level of Care: Hospital Recommended Level of Care: [place] Prior Approval Number:    Date Approved/Denied:   PASRR Number: [id number]  Discharge Plan: SNF    Current Diagnoses: Patient Active Problem List   Diagnosis Date Noted  . Intractable back pain 02/03/2019  . Thoracic compression fracture, closed, initial encounter ([place]) 02/03/2019  . Essential hypertension 02/03/2019  . Genetic testing 01/27/2019  . Acute pharyngitis   . Bacteremia   . FTT (failure to thrive) in adult   . Fracture closed, humerus, shaft 01/09/2019  . Pneumonia 12/25/2018  . Acute respiratory disorder in immunocompromised patient ([place]) 12/25/2018  . Bone metastases ([place]) 12/24/2018  . Family history of breast cancer   . Family history of prostate cancer   . Family history of melanoma   . Family history of colon cancer   . Pressure injury of skin 11/24/2018  . Chronic respiratory failure with hypoxia and hypercapnia ([place]) 11/19/2018  . 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 Hypophosphatemia 11/19/2018  . Hypocalcemia 11/19/2018  . Aspiration pneumonia ([place]) 11/19/2018  . SOB (shortness of breath)   . Palliative care by specialist   . Carcinoma of breast, estrogen receptor positive, stage 4, right ([place]) 11/16/2018  . Hypoalbuminemia 11/16/2018  . Pleural effusion 11/14/2018  . Goals of care, counseling/discussion 11/06/2018  . Malignant neoplasm of overlapping sites of right breast in  female, estrogen receptor positive ([place]) 10/31/2018  . Lung metastasis ([place]) 10/31/2018  . Pain from bone metastases ([place]) 10/31/2018  . Morbid obesity with BMI of 40.0-44.9, adult ([place]) 10/31/2018  . Bone injury   . Pleural effusion, bilateral   . Shortness of breath   . Abnormal breast finding   . Hypokalemia   . Breast skin changes   . Macrocytic anemia   . Bone metastasis ([place])   . Acute anemia 10/26/2018    Orientation RESPIRATION BLADDER Height & Weight     Self, Time, Situation, Place  O2(1.5L) Continent, External catheter Weight: 168 lb 10.4 oz (76.5 kg) Height:  5\' 3"  (160 cm)  BEHAVIORAL SYMPTOMS/MOOD NEUROLOGICAL BOWEL NUTRITION STATUS      Continent Diet(regular)  AMBULATORY STATUS COMMUNICATION OF NEEDS Skin   Extensive Assist Verbally Normal                       Personal Care Assistance Level of Assistance  Bathing, Feeding, Dressing Bathing Assistance: Limited assistance Feeding assistance: Independent Dressing Assistance: Limited assistance     Functional Limitations Info  Sight, Hearing, Speech Sight Info: Adequate Hearing Info: Impaired(hard of hearing) Speech Info: Adequate    SPECIAL CARE FACTORS FREQUENCY  PT (By licensed PT), OT (By licensed OT)     PT Frequency: 5x wk OT Frequency: 5x wk            Contractures Contractures Info: Not present    Additional Factors Info  Code Status, Allergies Code Status Info: Full code Allergies Info: NKA  Current Medications (02/06/2019):  This is the current hospital active medication list Current Facility-Administered Medications  Medication Dose Route Frequency Provider Last Rate Last Dose  . 0.9 %  sodium chloride infusion  250 mL Intravenous PRN Opyd, Ilene Qua, MD   Stopped at 02/04/19 0300  . acetaminophen (TYLENOL) tablet 650 mg  650 mg Oral Q6H PRN Opyd, Ilene Qua, MD       Or  . acetaminophen (TYLENOL) suppository 650 mg  650 mg Rectal Q6H PRN Opyd, Ilene Qua, MD       . albuterol (PROVENTIL) (2.5 MG/3ML) 0.083% nebulizer solution 2.5 mg  2.5 mg Nebulization Q6H PRN Opyd, Ilene Qua, MD      . ALPRAZolam Duanne Moron) tablet 0.25-0.5 mg  0.25-0.5 mg Oral Q8H PRN Opyd, Ilene Qua, MD      . anastrozole (ARIMIDEX) tablet 1 mg  1 mg Oral Daily Opyd, Ilene Qua, MD   1 mg at 02/06/19 1018  . benzonatate (TESSALON) capsule 200 mg  200 mg Oral TID PRN Opyd, Ilene Qua, MD      . bisacodyl (DULCOLAX) EC tablet 5 mg  5 mg Oral Daily PRN Opyd, Ilene Qua, MD      . carvedilol (COREG) tablet 3.125 mg  3.125 mg Oral BID WC Opyd, Ilene Qua, MD   3.125 mg at 02/06/19 0800  . dexamethasone (DECADRON) injection 2 mg  2 mg Intravenous Q12H Opyd, Ilene Qua, MD   2 mg at 02/06/19 0954  . enoxaparin (LOVENOX) injection 40 mg  40 mg Subcutaneous QHS Opyd, Ilene Qua, MD   40 mg at 02/05/19 2205  . feeding supplement (ENSURE ENLIVE) (ENSURE ENLIVE) liquid 237 mL  237 mL Oral BID BM Donne Hazel, MD   237 mL at 02/06/19 0955  . feeding supplement (PRO-STAT SUGAR FREE 64) liquid 30 mL  30 mL Oral TID BM Donne Hazel, MD   30 mL at 02/06/19 0800  . gabapentin (NEURONTIN) capsule 100 mg  100 mg Oral BID Vianne Bulls, MD   100 mg at 02/06/19 0954  . HYDROmorphone (DILAUDID) injection 1 mg  1 mg Intravenous Q4H PRN Opyd, Ilene Qua, MD      . lidocaine (LIDODERM) 5 % 1 patch  1 patch Transdermal Q24H Opyd, Ilene Qua, MD   1 patch at 02/05/19 2205  . multivitamin with minerals tablet 1 tablet  1 tablet Oral Daily Donne Hazel, MD   1 tablet at 02/06/19 0954  . nystatin (MYCOSTATIN/NYSTOP) topical powder   Topical TID Opyd, Ilene Qua, MD      . ondansetron (ZOFRAN) tablet 4 mg  4 mg Oral Q6H PRN Opyd, Ilene Qua, MD       Or  . ondansetron (ZOFRAN) injection 4 mg  4 mg Intravenous Q6H PRN Opyd, Ilene Qua, MD      . oxyCODONE (Oxy IR/ROXICODONE) immediate release tablet 5 mg  5 mg Oral Q4H PRN Opyd, Ilene Qua, MD   5 mg at 02/05/19 2205  . palbociclib (IBRANCE) capsule 100 mg  100 mg Oral  Daily Opyd, Ilene Qua, MD   100 mg at 02/06/19 1019  . polyethylene glycol (MIRALAX / GLYCOLAX) packet 17 g  17 g Oral Daily PRN Opyd, Ilene Qua, MD   17 g at 02/05/19 2205  . senna (SENOKOT) tablet 8.6 mg  1 tablet Oral BID Opyd, Ilene Qua, MD   8.6 mg at 02/06/19 0954  . sodium chloride flush (NS) 0.9 % injection 3 mL  3 mL Intravenous Q12H Opyd, Ilene Qua, MD   3 mL at 02/06/19 1001  . sodium chloride flush (NS) 0.9 % injection 3 mL  3 mL Intravenous PRN Opyd, Ilene Qua, MD   3 mL at 02/04/19 0353     Discharge Medications: Please see discharge summary for a list of discharge medications.  Relevant Imaging Results:  Relevant Lab Results:   Additional Information SS# [id number]  Wende Neighbors, LCSW

## 2019-02-06 NOTE — Evaluation (Signed)
Occupational Therapy Evaluation Patient Details Name: Suzanne [name] MRN: [id number] DOB: 03/30/1961 Today's Date: 02/06/2019    History of Present Illness Pt with recent admission for acute on chronic respiratory failure and hx of stage IV breast cancer (Dx May 2020) with diffuse osseous metastatic disease and lungs, chronic diastolic heart failure, anemia and pleural effusion. Pt s/p LUE ORIF 8/14 due to pathological fx.  Pt admitted for back pain on 02/03/19 and found to have T12 pathologic compression fracture   Clinical Impression   This 58 year old female was admitted for the above.  She was at home with her daughter prior to this admission; her daughter has CP but was able to help with basic self care activities.    Pt is hopeful for rehab after this admission to regain strength and independence.  Pt had L shoulder ORIF on 8/14 and has not follow up with Dr Veverly Fells yet.  Text paged Dr Wyline Copas as pt still has staples in.  Pt now has T12 compression fx; she is unable to wear TLSO due to poor fit/kyphosis per PT.  Will follow in acute setting with the goals listed below.      Follow Up Recommendations  SNF    Equipment Recommendations  None recommended by OT    Recommendations for Other Services       Precautions / Restrictions Precautions Precautions: Fall;Back Type of Shoulder Precautions: back for comfort Shoulder Interventions: Shoulder sling/immobilizer Precaution Comments: sling not in room pt reports new order) so created sling with bed sheet and requested RN check order Required Braces or Orthoses: Sling;Spinal Brace Spinal Brace: (TLSO not fitting) Restrictions Weight Bearing Restrictions: Yes LUE Weight Bearing: Non weight bearing Other Position/Activity Restrictions: L UE      Mobility Bed Mobility BY PT Overal bed mobility: Needs Assistance Bed Mobility: Supine to Sit     Supine to sit: Min assist;HOB elevated     General bed mobility comments: min A to pivot  hips to edge of bed  Transfers BY PT Overall transfer level: Needs assistance Equipment used: Quad cane Transfers: Sit to/from Stand Sit to Stand: Min guard;From elevated surface Stand pivot transfers: Min guard       General transfer comment: min/guard for safety, no loss of balance, pt on 1.5 L O2 during session, activity tolerance limited by fatigue    Balance Overall balance assessment: Needs assistance Sitting-balance support: No upper extremity supported;Feet supported Sitting balance-Leahy Scale: Good     Standing balance support: Single extremity supported Standing balance-Leahy Scale: Poor                             ADL either performed or assessed with clinical judgement   ADL   Eating/Feeding: Set up   Grooming: Set up Grooming Details (indicate cue type and reason): except for hair.  Pt with very bad tangles--tried to start combing out Upper Body Bathing: Moderate assistance   Lower Body Bathing: Moderate assistance;Sit to/from stand;With adaptive equipment   Upper Body Dressing : Moderate assistance;Sitting   Lower Body Dressing: Moderate assistance;Sit to/from stand;With adaptive equipment(reacher)                 General ADL Comments: OOB with PT with min A. Pt did not want to get up from chair during OT eval.  Issued long sponge and reacher for adls     Vision         Perception  Praxis      Pertinent Vitals/Pain Pain Assessment: Faces Pain Score: 3  Faces Pain Scale: Hurts a little bit Pain Location: low back Pain Descriptors / Indicators: Discomfort Pain Intervention(s): Limited activity within patient's tolerance;Monitored during session;Repositioned;Premedicated before session     Hand Dominance     Extremity/Trunk Assessment Upper Extremity Assessment LUE Deficits / Details: ORIF on 8/14; able to move elbow, wrist and fingers, also scapula retraction           Communication Communication Communication: No  difficulties   Cognition Arousal/Alertness: Awake/alert Behavior During Therapy: WFL for tasks assessed/performed Overall Cognitive Status: Within Functional Limits for tasks assessed                                 General Comments: pt has much brighter affect than last admission   General Comments       Exercises General Exercises - Lower Extremity Ankle Circles/Pumps: AROM;Both;15 reps;Supine Short Arc Quad: AROM;Both;10 reps;Supine   Shoulder Instructions      Home Living Family/patient expects to be discharged to:: Skilled nursing facility Living Arrangements: Children                               Additional Comments: pt lives with dtr who has CP; cont to be hopeful for SNF placement      Prior Functioning/Environment Level of Independence: Needs assistance    ADL's / Homemaking Assistance Needed: daughter assisted with bathing and dressing            OT Problem List: Decreased strength;Decreased knowledge of use of DME or AE;Decreased range of motion;Decreased activity tolerance;Impaired UE functional use;Impaired balance (sitting and/or standing);Pain      OT Treatment/Interventions: Self-care/ADL training;Therapeutic exercise;Patient/family education;Balance training;Energy conservation;Therapeutic activities;DME and/or AE instruction    OT Goals(Current goals can be found in the care plan section) Acute Rehab OT Goals Patient Stated Goal: Short term rehab OT Goal Formulation: With patient Time For Goal Achievement: 02/20/19 Potential to Achieve Goals: Good ADL Goals Pt Will Perform Lower Body Bathing: with min guard assist;with adaptive equipment;sit to/from stand Pt Will Perform Lower Body Dressing: with min guard assist;sit to/from stand(pants with reacher) Pt Will Transfer to Toilet: with min guard assist;ambulating;bedside commode Pt Will Perform Toileting - Clothing Manipulation and hygiene: with mod assist;sit to/from  stand Additional ADL Goal #1: pt will perform bed mobility from hospital bed at min guard level Additional ADL Goal #2: pt will perform LUE HEP (as allowed by MD) at supervision level  OT Frequency: Min 2X/week   Barriers to D/C:            Co-evaluation              AM-PAC OT "6 Clicks" Daily Activity     Outcome Measure Help from another person eating meals?: A Little Help from another person taking care of personal grooming?: A Little Help from another person toileting, which includes using toliet, bedpan, or urinal?: A Lot Help from another person bathing (including washing, rinsing, drying)?: A Little Help from another person to put on and taking off regular upper body clothing?: A Lot Help from another person to put on and taking off regular lower body clothing?: A Lot 6 Click Score: 15   End of Session    Activity Tolerance: Patient tolerated treatment well Patient left: in chair;with call bell/phone within reach  OT Visit Diagnosis: Muscle weakness (generalized) (M62.81)                Time: [id number] OT Time Calculation (min): 17 min Charges:  OT General Charges $OT Visit: 1 Visit OT Evaluation $OT Eval Moderate Complexity: [place], OTR/L Acute Rehabilitation Services [phone] [facility] pager [phone] office 02/06/2019  [place] 02/06/2019, 12:43 PM

## 2019-02-06 NOTE — Progress Notes (Signed)
Physical Therapy Treatment Patient Details Name: Suzanne [name] MRN: [id number] DOB: May 07, 1961 Today's Date: 02/06/2019    History of Present Illness Pt with recent admission for acute on chronic respiratory failure and hx of stage IV breast cancer (Dx May 2020) with diffuse osseous metastatic disease and lungs, chronic diastolic heart failure, anemia and pleural effusion. Pt s/p LUE ORIF 8/14 due to pathological fx.  Pt admitted for back pain on 02/03/19 and found to have T12 pathologic compression fracture    PT Comments    Pt tolerated increased activity level today, she transferred from bed to recliner with her quad cane and min/guard assistance. Instructed pt in LE strengthening exercises.  Pt reported increased pain with donning of TLSO brace. The brace had poor fit, there was significant gapping at thoracic spine 2* pt's kyphosis. She was unable to tolerate wearing it. She may benefit from a lumbar corset for support.    Follow Up Recommendations  SNF     Equipment Recommendations  None recommended by PT    Recommendations for Other Services       Precautions / Restrictions Precautions Precautions: Fall;Back Type of Shoulder Precautions: back for comfort Shoulder Interventions: Shoulder sling/immobilizer Precaution Comments: sling not in room pt reports new order) so created sling with bed sheet and requested RN check order Required Braces or Orthoses: Sling;Spinal Brace Spinal Brace: Thoracolumbosacral orthotic(donned TLSO, pt unable to tolerate 2* increased pain, poor fit 2* kyphosis) Restrictions Weight Bearing Restrictions: Yes LUE Weight Bearing: Non weight bearing Other Position/Activity Restrictions: L UE    Mobility  Bed Mobility Overal bed mobility: Needs Assistance Bed Mobility: Supine to Sit     Supine to sit: Min assist;HOB elevated     General bed mobility comments: min A to pivot hips to edge of bed  Transfers Overall transfer level: Needs  assistance Equipment used: Quad cane Transfers: Sit to/from Stand Sit to Stand: Min guard;From elevated surface Stand pivot transfers: Min guard       General transfer comment: min/guard for safety, no loss of balance, pt on 1.5 L O2 during session, activity tolerance limited by fatigue  Ambulation/Gait             General Gait Details: deferred 2* fatigue with transfer   Stairs             Wheelchair Mobility    Modified Rankin (Stroke Patients Only)       Balance Overall balance assessment: Needs assistance Sitting-balance support: No upper extremity supported;Feet supported Sitting balance-Leahy Scale: Good     Standing balance support: Single extremity supported Standing balance-Leahy Scale: Poor                              Cognition Arousal/Alertness: Awake/alert Behavior During Therapy: WFL for tasks assessed/performed Overall Cognitive Status: Within Functional Limits for tasks assessed                                 General Comments: pt tearful during PT session      Exercises General Exercises - Lower Extremity Ankle Circles/Pumps: AROM;Both;15 reps;Supine Short Arc Quad: AROM;Both;10 reps;Supine    General Comments        Pertinent Vitals/Pain Pain Score: 3  Pain Location: L low back in unsupported sitting Pain Descriptors / Indicators: Aching Pain Intervention(s): Limited activity within patient's tolerance;Monitored during session;Premedicated before session;Repositioned  Home Living                      Prior Function            PT Goals (current goals can now be found in the care plan section) Acute Rehab PT Goals Patient Stated Goal: Short term rehab PT Goal Formulation: With patient Time For Goal Achievement: 01/18/19 Potential to Achieve Goals: Fair Progress towards PT goals: Progressing toward goals    Frequency    Min 3X/week      PT Plan Current plan remains  appropriate    Co-evaluation              AM-PAC PT "6 Clicks" Mobility   Outcome Measure  Help needed turning from your back to your side while in a flat bed without using bedrails?: A Little Help needed moving from lying on your back to sitting on the side of a flat bed without using bedrails?: A Little Help needed moving to and from a bed to a chair (including a wheelchair)?: A Little Help needed standing up from a chair using your arms (e.g., wheelchair or bedside chair)?: A Little Help needed to walk in hospital room?: Total Help needed climbing 3-5 steps with a railing? : Total 6 Click Score: 14    End of Session Equipment Utilized During Treatment: Oxygen;Gait belt Activity Tolerance: Patient limited by fatigue Patient left: with call bell/phone within reach;in chair;with chair alarm set Nurse Communication: Mobility status PT Visit Diagnosis: Difficulty in walking, not elsewhere classified (R26.2);Unsteadiness on feet (R26.81)     Time: [id number] PT Time Calculation (min) (ACUTE ONLY): 18 min  Charges:  $Therapeutic Activity: 8-22 mins                    Blondell Reveal Kistler PT 02/06/2019  Acute Rehabilitation Services Pager [phone] Office [phone]

## 2019-02-06 NOTE — Progress Notes (Addendum)
Patient has arrived to unit. Patient is declining magnesium citrate right now stating that she is eating and wants to wait til later. Dose rescheduled for 2000. Will ask patient again after meal. Patient is alert and oriented with no signs of distress noted.

## 2019-02-06 NOTE — Progress Notes (Signed)
PROGRESS NOTE    Suzanne [name]  [id number] DOB: Oct 25, 1960 DOA: 02/03/2019 PCP: Patient, No Pcp Per    Brief Narrative:  58 y.o. 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  The patient reports several days of pain in the mid to upper back that has been rapidly worsening and severe to the point where she is unable to move without exacerbating the pain.  She denied any incontinence, weakness, or paresthesias associated with this.  She has not noted any fevers.  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  Her treatment is also been complicated by anemia requiring transfusions.  She was discharged from the hospital just over a week ago and SNF was recommended at that time, but due to lack of bed availability, she went home with home health.  She reports having adequate social support and states that her daughter works very hard to help her at home, but the patient is concerned that with her increase independence for basic ADLs, she will require more assistance than her daughter can provide. She denies any change in her chronic cough and SOB, reports starting azithromycin a couple days ago due to elevated WBC.   Assessment & Plan:   Principal Problem:   Intractable back pain Active Problems:   Hypokalemia   Macrocytic anemia   Malignant neoplasm of overlapping sites of right breast in female, estrogen receptor positive (HCC)   Pain from bone metastases (HCC)   Chronic respiratory failure with hypoxia and hypercapnia (HCC)   Thoracic compression fracture, closed, initial encounter ([place])   Essential hypertension  1. Intractable back pain; diffuse  osseous metastatic disease  - Presents with several days of severe back pain despite using oxycodone at home, denies inciting event, denies weakness/paresthesia/incontinence, and is found to have diffuse osseous metastatic disease on CT with subacute or acute T12 compression deformity   - Neurosurgery was consulted by ED physician, recs noted. No need for surgery at this time - Oncology recommended IR consult for consideration for kyphoplasty. IR does not consider pt a good candidate for kyphoplasty - Pt seen by PT. Per therapy, "Pt currently with functional limitations due to" multiple presenting deficits and "will benefit from skilled PT to increase their independence and safety with mobility to allow discharge." PT also reports an "Inaccessible home environment" with "decreased caregiver support," thus not safe for discharge yet - Pain seems stable at present. Anticipate rehab at SNF  2. Metastatic breast cancer  - Presented in May 2020 with diffuse metastases at that time  - She is being treated with anastrozole, palbociclib, and Xgeva  - Oncology following. Recs appreciated  3. Hypokalemia  - Serum potassium was 2.7 in ED   - Corrected - Labs reviewed  4. Hypertension  - BP at goal, continue Coreg  - Presently stable  5. Chronic hypoxic respiratory failure  - Attributed to malignant pleural effusions and stable on 2 Lpm with no acute respiratory complaints  - Currently stable  6. Macrocytic anemia - Hgb is 9.8 on admission  - Appears to be stable with no bleeding, likely secondary to chemotherapy  - Presently stable  7. Debility  - SNF was recommended at time of recent hospital discharge  but no beds were available and patient went home with home health  - Unfortunately, her condition has continued to decline and patient does not feel that her daughter will be able to provide all the assistance she now requires despite her best efforts   -PT following, recommendation for  SNF per above  8. Constipation -Pt reports last BM was over one week ago -No results with multiple cathartics  -Will give trial of Mg citrate. May also consider enema  DVT prophylaxis: Lovenox subQ Code Status: Full Family Communication: Pt in room, family at bedside Disposition Plan: SNF, timing uncertain  Consultants:   Oncology  Neurosurgery  IR  Procedures:     Antimicrobials: Anti-infectives (From admission, onward)   Start     Dose/Rate Route Frequency Ordered Stop   02/04/19 2200  azithromycin (ZITHROMAX) tablet 250 mg  Status:  Discontinued    Note to Pharmacy: 2 tab on day 1 then 1 tab daily until complete     250 mg Oral Daily at bedtime 02/03/19 2128 02/04/19 0907   02/03/19 2130  azithromycin (ZITHROMAX) tablet 500 mg    Note to Pharmacy: 2 tab on day 1 then 1 tab daily until complete     500 mg Oral NOW 02/03/19 1947 02/04/19 0100      Subjective: Eager to go to rehab, complaining of constipation   Objective: Vitals:   02/06/19 0455 02/06/19 0800 02/06/19 1414 02/06/19 1654  BP: 108/66 105/64 127/68 122/69  Pulse: 73 70 95 85  Resp: 16  18 18   Temp:   97.8 F (36.6 C) 98.4 F (36.9 C)  TempSrc:   Oral Oral  SpO2: 100%  96% 97%  Weight:    75.8 kg  Height:    5\' 3"  (1.6 m)    Intake/Output Summary (Last 24 hours) at 02/06/2019 1735 Last data filed at 02/06/2019 1701 Gross per 24 hour  Intake 610 ml  Output 1100 ml  Net -490 ml   Filed Weights   02/04/19 0336 02/06/19 1654  Weight: 76.5 kg 75.8 kg    Examination: General exam: Conversant, in no acute distress Respiratory system: normal chest rise, clear, no audible wheezing Cardiovascular system: regular rhythm, s1-s2 Gastrointestinal system: Nondistended, nontender, pos BS Central nervous system: No seizures, no tremors Extremities: No cyanosis, no joint deformities Skin: No rashes, no pallor Psychiatry: Affect normal // no auditory hallucinations   Data Reviewed: I have  personally reviewed following labs and imaging studies  CBC: Recent Labs  Lab 02/03/19 1621 02/04/19 0325 02/04/19 0539 02/05/19 0840  WBC 4.9 QUESTIONABLE RESULTS, RECOMMEND RECOLLECT TO VERIFY 4.2 5.6  NEUTROABS 3.2 QUESTIONABLE RESULTS, RECOMMEND RECOLLECT TO VERIFY 3.1  --   HGB 9.8* QUESTIONABLE RESULTS, RECOMMEND RECOLLECT TO VERIFY 9.5* 8.6*  HCT 33.1* QUESTIONABLE RESULTS, RECOMMEND RECOLLECT TO VERIFY 32.0* 29.0*  MCV 106.1* QUESTIONABLE RESULTS, RECOMMEND RECOLLECT TO VERIFY 108.5* 109.8*  PLT 456* QUESTIONABLE RESULTS, RECOMMEND RECOLLECT TO VERIFY 408* [id number]*   Basic Metabolic Panel: Recent Labs  Lab 02/03/19 1621 02/04/19 0325 02/04/19 0539 02/05/19 0840  NA 141 QUESTIONABLE RESULTS, RECOMMEND RECOLLECT TO VERIFY 139 140  K 2.7* QUESTIONABLE RESULTS, RECOMMEND RECOLLECT TO VERIFY 2.9* 4.3  CL 91* QUESTIONABLE RESULTS, RECOMMEND RECOLLECT TO VERIFY 92* 96*  CO2 35* QUESTIONABLE RESULTS, RECOMMEND RECOLLECT TO VERIFY 33* 32  GLUCOSE 122* QUESTIONABLE RESULTS, RECOMMEND RECOLLECT TO VERIFY 127* 154*  BUN 12 QUESTIONABLE RESULTS, RECOMMEND RECOLLECT TO VERIFY 15 23*  CREATININE 0.83 QUESTIONABLE RESULTS, RECOMMEND RECOLLECT TO VERIFY 0.81 0.91  CALCIUM 8.9 QUESTIONABLE RESULTS, RECOMMEND RECOLLECT TO VERIFY 8.9 8.4*  MG  --  QUESTIONABLE RESULTS, RECOMMEND RECOLLECT TO VERIFY 2.8*  --    GFR: Estimated Creatinine Clearance: 64.1 mL/min (by C-G formula based on SCr of 0.91 mg/dL). Liver Function Tests: No results for input(s): AST, ALT, ALKPHOS, BILITOT, PROT, ALBUMIN in the last 168 hours. No results for input(s): LIPASE, AMYLASE in the last 168 hours. No results for input(s): AMMONIA in the last 168 hours. Coagulation Profile: No results for input(s): INR, PROTIME in the last 168 hours. Cardiac Enzymes: No results for input(s): CKTOTAL, CKMB, CKMBINDEX, TROPONINI in the last 168 hours. BNP (last 3 results) No results for input(s): PROBNP in the last 8760 hours.  HbA1C: No results for input(s): HGBA1C in the last 72 hours. CBG: Recent Labs  Lab 02/04/19 0741 02/05/19 0739 02/06/19 0757  GLUCAP 127* 162* 129*   Lipid Profile: No results for input(s): CHOL, HDL, LDLCALC, TRIG, CHOLHDL, LDLDIRECT in the last 72 hours. Thyroid Function Tests: No results for input(s): TSH, T4TOTAL, FREET4, T3FREE, THYROIDAB in the last 72 hours. Anemia Panel: No results for input(s): VITAMINB12, FOLATE, FERRITIN, TIBC, IRON, RETICCTPCT in the last 72 hours. Sepsis Labs: No results for input(s): PROCALCITON, LATICACIDVEN in the last 168 hours.  Recent Results (from the past 240 hour(s))  SARS Coronavirus 2 [facility] order, Performed in [facility] hospital lab) Nasopharyngeal Nasopharyngeal Swab     Status: None   Collection Time: 02/03/19  7:22 PM   Specimen: Nasopharyngeal Swab  Result Value Ref Range Status   SARS Coronavirus 2 NEGATIVE NEGATIVE Final    Comment: (NOTE) If result is NEGATIVE SARS-CoV-2 target nucleic acids are NOT DETECTED. The SARS-CoV-2 RNA is generally detectable in upper and lower  respiratory specimens during the acute phase of infection. The lowest  concentration of SARS-CoV-2 viral copies this assay can detect is 250  copies / mL. A negative result does not preclude SARS-CoV-2 infection  and should not be used as the sole basis for treatment or other  patient management decisions.  A negative result may occur with  improper specimen collection / handling, submission of specimen other  than nasopharyngeal swab, presence of viral mutation(s) within the  areas targeted by this assay, and inadequate number of viral copies  (<250 copies / mL). A negative result must be combined with clinical  observations, patient history, and epidemiological information. If result is POSITIVE SARS-CoV-2 target nucleic acids are DETECTED. The SARS-CoV-2 RNA is generally detectable in upper and lower  respiratory specimens dur ing the acute phase of  infection.  Positive  results are indicative of active infection with SARS-CoV-2.  Clinical  correlation with patient history and other diagnostic information is  necessary to determine patient infection status.  Positive results do  not rule out bacterial infection or co-infection with other viruses. If result is PRESUMPTIVE POSTIVE SARS-CoV-2 nucleic acids MAY BE PRESENT.   A presumptive positive result was obtained on the submitted specimen  and confirmed on repeat testing.  While 2019 novel coronavirus  (SARS-CoV-2) nucleic acids may be present in the submitted sample  additional confirmatory testing may be necessary for epidemiological  and / or clinical management purposes  to differentiate between  SARS-CoV-2 and other Sarbecovirus currently known to infect humans.  If clinically indicated additional testing with an alternate test  methodology [phone]) is advised. The SARS-CoV-2 RNA is generally  detectable in upper and lower respiratory sp ecimens during the acute  phase of infection. The expected  result is Negative. Fact Sheet for Patients:  StrictlyIdeas.no Fact Sheet for Healthcare Providers: BankingDealers.co.za This test is not yet approved or cleared by the [place] FDA and has been authorized for detection and/or diagnosis of SARS-CoV-2 by FDA under an Emergency Use Authorization (EUA).  This EUA will remain in effect (meaning this test can be used) for the duration of the COVID-19 declaration under Section 564(b)(1) of the Act, 21 U.S.C. section 360bbb-3(b)(1), unless the authorization is terminated or revoked sooner. Performed at [facility], [address], [place], [place] [address]      Radiology Studies: Dg Lumbar Spine Complete  Result Date: 02/04/2019 CLINICAL DATA:  Back pain EXAM: LUMBAR SPINE - COMPLETE 4+ VIEW COMPARISON:  CT 02/03/2019 FINDINGS: Diffuse sclerosis and lytic process,  consistent with skeletal metastatic disease. Acute kyphosis centered at T11-T12 with moderate compression deformities at T11 and T12, loss of the intervertebral disc space. Remaining lumbar vertebra demonstrate normal stature. Degenerative changes at L1-L2. Aortic atherosclerosis. Right L1 transverse process fracture is better seen on the recent CT. IMPRESSION: Extensive, diffuse skeletal metastatic disease. Focal kyphosis centered at T11-T12 with compression deformities at T11 and T12. Electronically Signed   By: Donavan Foil M.D.   On: 02/04/2019 19:31    Scheduled Meds: . anastrozole  1 mg Oral Daily  . carvedilol  3.125 mg Oral BID WC  . dexamethasone (DECADRON) injection  2 mg Intravenous Q12H  . enoxaparin (LOVENOX) injection  40 mg Subcutaneous QHS  . feeding supplement (ENSURE ENLIVE)  237 mL Oral BID BM  . feeding supplement (PRO-STAT SUGAR FREE 64)  30 mL Oral TID BM  . gabapentin  100 mg Oral BID  . lidocaine  1 patch Transdermal Q24H  . magnesium citrate  1 Bottle Oral Once  . multivitamin with minerals  1 tablet Oral Daily  . nystatin   Topical TID  . palbociclib  100 mg Oral Daily  . senna  1 tablet Oral BID  . sodium chloride flush  3 mL Intravenous Q12H   Continuous Infusions: . sodium chloride Stopped (02/04/19 0300)     LOS: 2 days   Marylu Lund, MD [facility] Pager On Amion  If 7PM-7AM, please contact night-coverage 02/06/2019, 5:35 PM

## 2019-02-06 NOTE — TOC Progression Note (Signed)
Transition of Care [facility]) - Progression Note    Patient Details  Name: Suzanne [name] MRN: [id number] Date of Birth: [birth year]  Transition of Care [facility] CM/SW Zeb, LCSW Phone Number: 02/06/2019, 11:34 AM  Clinical Narrative:   CSW following patient for support and discharge needs. Patient at this time does not have any bed offers. CSW received phone call from [facility] admission coordinator for [facility]. Ebony Hail stated they are looking at patient but will need to get authorization from there higher up before being able to make a bed offer. CSW waiting for bed offers     Expected Discharge Plan: [place] Barriers to Discharge: Continued Medical Work up, SNF Pending bed offer  Expected Discharge Plan and Services Expected Discharge Plan: [place] In-house Referral: Clinical Social Work     Living arrangements for the past 2 months: Single Family Home Expected Discharge Date: 02/05/19                                     Social Determinants of Health (SDOH) Interventions    Readmission Risk Interventions Readmission Risk Prevention Plan 01/16/2019 12/01/2018 11/17/2018  Transportation Screening Complete Complete Complete  PCP or Specialist Appt within 3-5 Days - - Not Complete  Not Complete comments - - will need pcp set up  [place] or [place] - - Complete  Social Work Consult for [place] Planning/Counseling - - Complete  Palliative Care Screening - - Not Complete  Palliative Care Screening Not Complete Comments - - pending- bedside RN states plan to consult palliative this admission  Medication Review (RN Care Manager) Complete Complete Complete  PCP or Specialist appointment within 3-5 days of discharge Complete Complete -  HRI or Home Care Consult Complete Complete -  SW Recovery Care/Counseling Consult Complete Complete -  Palliative Care Screening Not Applicable Not Applicable -  Skilled Nursing  Facility Complete Complete -  Some recent data might be hidden

## 2019-02-06 NOTE — Progress Notes (Signed)
HEMATOLOGY-ONCOLOGY PROGRESS NOTE  SUBJECTIVE: Suzanne [name]'s pain is controlled with oral pain medication.  Unfortunately, she is not a candidate for kyphoplasty by interventional radiology.  Working with PT/OT.  Hoping for placement at short-term rehabilitation.  Having issues with constipation.  Still has Foley catheter in place.  No lower extremity weakness or numbness.  REVIEW OF SYSTEMS:   Constitutional: Denies fevers, chills or abnormal weight loss Eyes: Denies blurriness of vision Ears, nose, mouth, throat, and face: Denies mucositis or sore throat Respiratory: Denies cough, dyspnea or wheezes Cardiovascular: Denies palpitation, chest discomfort Gastrointestinal:  Denies nausea, heartburn or change in bowel habits Skin: Denies abnormal skin rashes Lymphatics: Denies new lymphadenopathy or easy bruising Neurological:Denies numbness, tingling or new weaknesses Behavioral/Psych: Mood is stable, no new changes  Extremities: No lower extremity edema MSK: Back pain improved with oral pain medication All other systems were reviewed with the patient and are negative.  I have reviewed the past medical history, past surgical history, social history and family history with the patient and they are unchanged from previous note.   PHYSICAL EXAMINATION: ECOG PERFORMANCE STATUS: 2 - Symptomatic, <50% confined to bed  Vitals:   02/06/19 0455 02/06/19 0800  BP: 108/66 105/64  Pulse: 73 70  Resp: 16   Temp:    SpO2: 100%    Filed Weights   02/04/19 0336  Weight: 168 lb 10.4 oz (76.5 kg)    Intake/Output from previous day: 09/10 0701 - 09/11 0700 In: 490 [P.O.:480; I.V.:10] Out: 1350 [Urine:1350]  GENERAL:alert, no distress and comfortable SKIN: skin color, texture, turgor are normal, no rashes or significant lesions EYES: normal, Conjunctiva are pink and non-injected, sclera clear OROPHARYNX:no exudate, no erythema and lips, buccal mucosa, and tongue normal  NECK: supple, thyroid  normal size, non-tender, without nodularity LYMPH:  no palpable lymphadenopathy in the cervical, axillary or inguinal LUNGS: clear to auscultation and percussion with normal breathing effort HEART: regular rate & rhythm and no murmurs and no lower extremity edema ABDOMEN:abdomen soft, non-tender and normal bowel sounds Musculoskeletal: Spinal tenderness over the thoracic spine NEURO: alert & oriented x 3 with fluent speech, no focal motor/sensory deficits  LABORATORY DATA:  I have reviewed the data as listed CMP Latest Ref Rng & Units 02/05/2019 02/04/2019 02/04/2019  Glucose 70 - 99 mg/dL 154(H) 127(H) QUESTIONABLE RESULTS, RECOMMEND RECOLLECT TO VERIFY  BUN 6 - 20 mg/dL 23(H) 15 QUESTIONABLE RESULTS, RECOMMEND RECOLLECT TO VERIFY  Creatinine 0.44 - 1.00 mg/dL 0.91 0.81 QUESTIONABLE RESULTS, RECOMMEND RECOLLECT TO VERIFY  Sodium 135 - 145 mmol/L 140 139 QUESTIONABLE RESULTS, RECOMMEND RECOLLECT TO VERIFY  Potassium 3.5 - 5.1 mmol/L 4.3 2.9(L) QUESTIONABLE RESULTS, RECOMMEND RECOLLECT TO VERIFY  Chloride 98 - 111 mmol/L 96(L) 92(L) QUESTIONABLE RESULTS, RECOMMEND RECOLLECT TO VERIFY  CO2 22 - 32 mmol/L 32 33(H) QUESTIONABLE RESULTS, RECOMMEND RECOLLECT TO VERIFY  Calcium 8.9 - 10.3 mg/dL 8.4(L) 8.9 QUESTIONABLE RESULTS, RECOMMEND RECOLLECT TO VERIFY  Total Protein 6.5 - 8.1 g/dL - - -  Total Bilirubin 0.3 - 1.2 mg/dL - - -  Alkaline Phos 38 - 126 U/L - - -  AST 15 - 41 U/L - - -  ALT 0 - 44 U/L - - -    Lab Results  Component Value Date   WBC 5.6 02/05/2019   HGB 8.6 (L) 02/05/2019   HCT 29.0 (L) 02/05/2019   MCV 109.8 (H) 02/05/2019   PLT 423 (H) 02/05/2019   NEUTROABS 3.1 02/04/2019    Dg Chest 1 View  Result  Date: 01/20/2019 CLINICAL DATA:  Status post right thoracentesis. History of metastatic breast cancer EXAM: CHEST  1 VIEW COMPARISON:  01/17/2019 FINDINGS: Stable cardiomediastinal contours. Decrease right-sided pleural effusion. Lung volumes are low. No appreciable  pneumothorax. A right-sided PICC line is present. Surgical fixation hardware in the proximal left humerus. Extensive osseous metastatic disease throughout the visualized thorax. IMPRESSION: Interval decrease in a right-sided pleural effusion. No pneumothorax identified. Electronically Signed   By: Davina Poke M.D.   On: 01/20/2019 15:54   Dg Lumbar Spine Complete  Result Date: 02/04/2019 CLINICAL DATA:  Back pain EXAM: LUMBAR SPINE - COMPLETE 4+ VIEW COMPARISON:  CT 02/03/2019 FINDINGS: Diffuse sclerosis and lytic process, consistent with skeletal metastatic disease. Acute kyphosis centered at T11-T12 with moderate compression deformities at T11 and T12, loss of the intervertebral disc space. Remaining lumbar vertebra demonstrate normal stature. Degenerative changes at L1-L2. Aortic atherosclerosis. Right L1 transverse process fracture is better seen on the recent CT. IMPRESSION: Extensive, diffuse skeletal metastatic disease. Focal kyphosis centered at T11-T12 with compression deformities at T11 and T12. Electronically Signed   By: Donavan Foil M.D.   On: 02/04/2019 19:31   Ct Thoracic Spine Wo Contrast  Result Date: 02/03/2019 CLINICAL DATA:  Mid back pain/T-spine pain, initial exam. EXAM: CT THORACIC SPINE WITHOUT CONTRAST TECHNIQUE: Multidetector CT images of the thoracic were obtained using the standard protocol without intravenous contrast. COMPARISON:  CT angiography chest 12/25/2018 FINDINGS: Alignment: Focal kyphosis centered at the T11-T12 level. Trace T12 bony retropulsion. Vertebrae: Redemonstrated marked diffuse osseous heterogeneity with multifocal sclerosis and lytic changes. Findings are consistent with diffuse osseous metastatic disease. New from prior chest CT 12/25/2018, there is a T12 anterior wedge compression deformity with approximately 50% loss of vertebral body height and trace bony retropulsion. Mild loss of T11 vertebral body height is also questioned, and this was not  definitively present on prior examination. Widening of the T10-T11 and T11-T12 interspinous spaces appear similar to prior exam. Trace T12 bony retropulsion and T11-T12 gibbus deformity results in mild to moderate bony narrowing of the spinal canal at the T11-T12 level. At the remaining levels, no high-grade bony spinal canal stenosis or neural foraminal narrowing. Vertebral body height is otherwise maintained. No other definite acute fracture to the thoracic spine is identified. Redemonstrated are multiple bilateral subacute pathologic rib fractures. Paraspinal and other soft tissues: Soft tissue prominence surrounding the T12 vertebra, which may reflect extra ertebral extension of metastatic disease. Small bilateral pleural effusion are similar to prior examination. Redemonstrated bilateral bandlike opacities which may reflect scar or fibrosis. Hiatal hernia containing what appears to be enteric contrast. Cholecystolithiasis Disc levels: Multilevel loss of intervertebral disc height. Thoracic spondylosis. Apart from mild to moderate narrowing at the T11-T12 level related to focal gibbus deformity and trace T12 bony retropulsion, no high-grade bony spinal canal stenosis or neural foraminal narrowing at the remaining levels. IMPRESSION: Redemonstrated diffuse osseous metastatic disease. New from prior chest CT 12/25/2018, there is an acute/subacute T12 compression deformity with approximately 50% loss of vertebral body height and trace bony retropulsion. Mild loss of T11 vertebral body height is also questioned, and new from prior examination. Resultant focal kyphosis at the T11-T12 level with mild to moderate spinal canal narrowing. Widening of the T10-T11 and T12-L1 interspinous spaces appears similar to prior examination, but MRI may be obtained for further evaluation to definitively exclude ligamentous injury. Soft tissue prominence surrounding the T12 vertebra which may reflect extra vertebral extension of  metastatic disease. Consider contrast-enhanced MRI for  further evaluation, as clinically warranted. No other definite acute fracture to the thoracic spine. Redemonstrated are multiple nonacute bilateral pathologic rib fractures. Small bilateral pleural effusions. Hiatal hernia Cholecystolithiasis. Electronically Signed   By: Kellie Simmering   On: 02/03/2019 18:13   Ct Lumbar Spine Wo Contrast  Result Date: 02/03/2019 CLINICAL DATA:  Back pain, osteoporosis and chronic steroid use. EXAM: CT LUMBAR SPINE WITHOUT CONTRAST TECHNIQUE: Multidetector CT imaging of the lumbar spine was performed without intravenous contrast administration. Multiplanar CT image reconstructions were also generated. COMPARISON:  CT angiography chest 12/25/2018, FINDINGS: Segmentation: For the purposes of this examination, 5 lumbar vertebrae are assumed and the caudal most well-formed intervertebral disc is designated L5-S1. Alignment: Trace L1-L2 and L2-L3 grade 1 retrolisthesis. Vertebrae: There is marked diffuse osseous heterogeneity with multifocal sclerotic and lytic changes. Findings are consistent with diffuse osseous metastatic disease. No lumbar compression deformity. Subacute appearing fracture of the right L1 transverse process. No other definite fracture of the lumbar spine is identified. Subacute appearing fracture of the posterior left twelfth rib. Paraspinal and other soft tissues: The imaged paraspinal soft tissues are unremarkable. Aortoiliac atherosclerotic disease. Disc levels: Moderate L1-L2 disc degeneration. Intervertebral disc height is otherwise preserved within the lumbar spine. Lumbar spondylosis without high-grade bony spinal canal or neural foraminal stenosis. IMPRESSION: Findings consistent with diffuse osseous metastatic disease. No lumbar compression deformity. Subacute appearing fracture of the right L1 transverse process. No other definite fracture of the lumbar spine is identified. Subacute appearing fracture  of the posterior left twelfth rib. Electronically Signed   By: Kellie Simmering   On: 02/03/2019 17:48   Dg Chest Port 1 View  Result Date: 01/17/2019 CLINICAL DATA:  Shortness of breath, cough, metastatic breast cancer, malignant pleural effusion EXAM: PORTABLE CHEST 1 VIEW COMPARISON:  01/13/2019 FINDINGS: No significant change in AP portable examination with large right, moderate left pleural effusions and associated atelectasis or consolidation. No new airspace opacity. The heart and mediastinum are predominantly obscured. Extensive sclerotic osseous metastatic disease with plate and screw fixation of the proximal left humerus. Right upper extremity PICC. IMPRESSION: No significant change in AP portable examination with large right, moderate left pleural effusions and associated atelectasis or consolidation. No new airspace opacity. The heart and mediastinum are predominantly obscured. Extensive sclerotic osseous metastatic disease with plate and screw fixation of the proximal left humerus. Right upper extremity PICC. Electronically Signed   By: Eddie Candle M.D.   On: 01/17/2019 14:42   Dg Chest Port 1 View  Result Date: 01/13/2019 CLINICAL DATA:  Aspiration into respiratory tract. Acute on chronic respiratory failure. EXAM: PORTABLE CHEST 1 VIEW COMPARISON:  Multiple prior exams most recently earlier this day. FINDINGS: Patient's chin obscures the apices. Right upper extremity PICC with tip unchanged. Progressive hazy opacity at the lung bases likely pleural effusions. Increasing vascular congestion. Grossly unchanged heart size and mediastinal contours. No pneumothorax. Osseous metastatic disease with recent surgical fixation of the left proximal humerus. IMPRESSION: Progressive hazy opacity at the lung bases likely pleural effusions. Increasing vascular congestion. Electronically Signed   By: Keith Rake M.D.   On: 01/13/2019 23:11   Dg Chest Port 1 View  Result Date: 01/13/2019 CLINICAL DATA:   Atelectasis EXAM: PORTABLE CHEST 1 VIEW COMPARISON:  01/10/2019 FINDINGS: Cardiac shadow is stable. Endotracheal tube is been removed as has a gastric catheter. Right-sided PICC line remains in satisfactory position. Elevation of the right hemidiaphragm is again seen and stable. Bibasilar atelectatic changes are noted increased from the prior  study. Postsurgical changes in the left shoulder are noted. IMPRESSION: Interval removal of tubes and lines. Slight increase in bibasilar atelectasis. Electronically Signed   By: Inez Catalina M.D.   On: 01/13/2019 07:20   Dg Chest Port 1 View  Result Date: 01/10/2019 CLINICAL DATA:  Intubation and OG tube placement. EXAM: PORTABLE CHEST 1 VIEW COMPARISON:  Radiograph yesterday. FINDINGS: Endotracheal tube tip 2.4 cm from the carina. Enteric tube tip below the diaphragm not included in the field of view. Tip of the right upper extremity PICC in the upper SVC. Low lung volumes persist with slight decrease patchy opacities from prior exam. Persistent blunting of both costophrenic angles. Unchanged heart size and mediastinal contours. Bony metastatic disease again seen. Recent left humeral fixation is partially included. IMPRESSION: 1. Endotracheal tube tip 2.4 cm from the carina. Enteric tube tip below the diaphragm not included in the field of view. 2. Persistent low lung volumes. Slight improvement in bilateral patchy opacities from prior exam. Electronically Signed   By: Keith Rake M.D.   On: 01/10/2019 01:06   Dg Chest Port 1 View  Result Date: 01/09/2019 CLINICAL DATA:  Shortness of breath. EXAM: PORTABLE CHEST 1 VIEW COMPARISON:  Radiograph 01/05/2019, chest CT 12/25/2018 FINDINGS: Very low lung volumes limit assessment, lower lung volumes from prior exam. Right upper extremity PICC tip in the SVC. Cardiomediastinal contours are grossly stable, not well visualized due to low lung volumes. Progression in bilateral lung opacities, right perihilar and left lung  base. Blunting of the costophrenic angles likely effusions. Osseous metastatic disease again seen. IMPRESSION: 1. Very low lung volumes, limited assessment lower lung volumes from prior exam. 2. Progression in bilateral lung opacities, right perihilar and left lung base, may be combination of pulmonary edema and pneumonia. Suspected pleural effusions. Electronically Signed   By: Keith Rake M.D.   On: 01/09/2019 22:14   Dg Shoulder Left Port  Result Date: 01/09/2019 CLINICAL DATA:  Postop EXAM: LEFT SHOULDER - 1 VIEW COMPARISON:  01/05/2019 FINDINGS: Interval surgical plate and multiple screw fixation of the left humerus for pathologic proximal humerus fracture. Near anatomic alignment. No humeral head dislocation. Numerous lytic and sclerotic lesions consistent with metastatic disease. Amorphous opacities the proximal humerus at the level of the fracture, presumed to represent surgical packing material. IMPRESSION: Interval surgical plate and multiple screw fixation of proximal left humerus fracture with expected operative changes. Numerous lytic and sclerotic osseous lesions, consistent with skeletal metastatic disease Electronically Signed   By: Donavan Foil M.D.   On: 01/09/2019 21:13   Dg Abd Portable 1v  Result Date: 01/10/2019 CLINICAL DATA:  OG tube placement. EXAM: PORTABLE ABDOMEN - 1 VIEW COMPARISON:  None. FINDINGS: Tip and side port of the enteric tube below the diaphragm in the stomach. Relative paucity of upper abdominal bowel gas. No evidence of free air or obstruction. IMPRESSION: Tip and side port of the enteric tube below the diaphragm in the stomach. Electronically Signed   By: Keith Rake M.D.   On: 01/10/2019 01:07   US Thoracentesis Asp Pleural Space W/img Guide  Result Date: 01/20/2019 INDICATION: Patient with history of metastatic breast cancer, recurrent malignant right pleural effusion. Request to IR for diagnostic and therapeutic right sided thoracentesis today.  EXAM: ULTRASOUND GUIDED RIGHT THORACENTESIS MEDICATIONS: 10 mL 1% lidocaine COMPLICATIONS: None immediate. PROCEDURE: An ultrasound guided thoracentesis was thoroughly discussed with the patient and questions answered. The benefits, risks, alternatives and complications were also discussed. The patient understands and wishes to proceed  with the procedure. Written consent was obtained. Ultrasound was performed to localize and mark an adequate pocket of fluid in the right chest. The area was then prepped and draped in the normal sterile fashion. 1% Lidocaine was used for local anesthesia. Under ultrasound guidance a 6 Fr Safe-T-Centesis catheter was introduced. No pleural fluid was able to be aspirated. Patient with significant pain in her back and left shoulder which was present prior to the procedure and was unchanged during the procedure - she is unable to lay on her left side due to recent open reduction internal fixation of the left humerus. Due to significant pain patient requested that procedure be aborted. The catheter was removed and a dressing applied. FINDINGS: No pleural fluid obtained. IMPRESSION: Unsuccessful ultrasound guided right thoracentesis yielding no of pleural fluid. Read by Candiss Norse, PA-C Electronically Signed   By: Jacqulynn Cadet M.D.   On: 01/20/2019 15:54    ASSESSMENT: 58 y.o. [place], [place] woman presenting 10/26/2018 with right-sided inflammatory breast cancer, stage IV, involvnh lungs, lymph nodes and bones, as follows:             (a) chest CT scan 10/26/2018 shows bilateral pleural effusions, possible lymphangitic spread of tumor, diffuse bony metastatic disease, and significant axillary mediastinal and hilar adenopathy             (b) bone scan 10/27/2018 is a "near [place]" consistent with widespread bony metastatic disease             (c) head CT with and without contrast 10/30/2018 shows no intracranial metastatic disease, multiple calvarial  lesions             (d) CA-27-29 on 10/27/2018 was 1810.4  (1) pleural fluid from right thoracentesis 10/28/2018 confirms malignant cells consistent with a breast primary, strongly estrogen and progesterone receptor positive, HER-2 not amplified, with an MIB-1 of 2%  (2) anastrozole started 10/29/2018             (a) palbociclib to start 11/13/2018             (b) denosumab/xgeva to start 11/13/2018  (3) associated problems:             (a) hypoxia secondary to effusions             (b) pain from bone lesions             (c) right upper extremity lymphedema             (d) anemia, requiring transfusion             (e) poor venous access  (4) genetics testing pending  PLAN: Tonishia's back pain is overall improved.  She is taking oral pain medication.  Not a candidate for kyphoplasty secondary to body habitus.  Will need to continue oral pain medication and try to work with PT/OT to maximize her functional status.  Continue anastrozole and Ibrance.  She has mild anemia likely related to her Ibrance.  We will continue to monitor this closely.  [name] is to be taking 3 weeks on and one-week off.  The patient thinks that she started her last cycle on 01/24/2019 so by my calculation, she should take her last dose of this cycle on 02/13/2019.  Continue MiraLAX for constipation.  Recommend adding one half bottle of magnesium citrate to help get her bowels moving.  She also has a Foley catheter remains in place.  May want to see if we can  try to remove this to see if she can void on her own.  Hopefully, she will be discharged to a skilled nursing facility for rehabilitation.  I really feel as though this will be the best option for her to maximize her functional status.    LOS: 2 days   Mikey Bussing, DNP, AGPCNP-BC, AOCNP 02/06/19

## 2019-02-06 NOTE — TOC Progression Note (Signed)
Transition of Care [facility]) - Progression Note    Patient Details  Name: Suzanne [name] MRN: [id number] Date of Birth: 1960-12-01  [facility] CM/SW [place], LCSW Phone Number: 02/06/2019, 2:12 PM  Clinical Narrative:   Patient has bed at [facility]. Both patient and daughter are aware and agreeable to discharge once medically cleared. Patient will need new covid test prior to discharge     Expected Discharge Plan: Skilled Nursing Facility Barriers to Discharge: Continued Medical Work up, SNF Pending bed offer  Expected Discharge Plan and Services Expected Discharge Plan: [place] In-house Referral: Clinical Social Work     Living arrangements for the past 2 months: Single Family Home Expected Discharge Date: 02/05/19                                     Social Determinants of Health (SDOH) Interventions    Readmission Risk Interventions Readmission Risk Prevention Plan 01/16/2019 12/01/2018 11/17/2018  Transportation Screening Complete Complete Complete  PCP or Specialist Appt within 3-5 Days - - Not Complete  Not Complete comments - - will need pcp set up  [place] or [place] - - Complete  Social Work Consult for [place] Planning/Counseling - - Complete  Palliative Care Screening - - Not Complete  Palliative Care Screening Not Complete Comments - - pending- bedside RN states plan to consult palliative this admission  Medication Review (RN Care Manager) Complete Complete Complete  PCP or Specialist appointment within 3-5 days of discharge Complete Complete -  HRI or Home Care Consult Complete Complete -  SW Recovery Care/Counseling Consult Complete Complete -  Palliative Care Screening Not Applicable Not Applicable -  Skilled Nursing Facility Complete Complete -  Some recent data might be hidden

## 2019-02-07 ENCOUNTER — Other Ambulatory Visit: Payer: Self-pay | Admitting: Oncology

## 2019-02-07 DIAGNOSIS — Z7401 Bed confinement status: Secondary | ICD-10-CM | POA: Diagnosis not present

## 2019-02-07 DIAGNOSIS — I10 Essential (primary) hypertension: Secondary | ICD-10-CM | POA: Diagnosis not present

## 2019-02-07 DIAGNOSIS — J9612 Chronic respiratory failure with hypercapnia: Secondary | ICD-10-CM | POA: Diagnosis not present

## 2019-02-07 DIAGNOSIS — S22081A Stable burst fracture of T11-T12 vertebra, initial encounter for closed fracture: Secondary | ICD-10-CM | POA: Diagnosis not present

## 2019-02-07 DIAGNOSIS — J9611 Chronic respiratory failure with hypoxia: Secondary | ICD-10-CM | POA: Diagnosis not present

## 2019-02-07 DIAGNOSIS — M549 Dorsalgia, unspecified: Secondary | ICD-10-CM | POA: Diagnosis not present

## 2019-02-07 DIAGNOSIS — M2550 Pain in unspecified joint: Secondary | ICD-10-CM | POA: Diagnosis not present

## 2019-02-07 DIAGNOSIS — C50911 Malignant neoplasm of unspecified site of right female breast: Secondary | ICD-10-CM | POA: Diagnosis not present

## 2019-02-07 DIAGNOSIS — R5381 Other malaise: Secondary | ICD-10-CM | POA: Diagnosis not present

## 2019-02-07 DIAGNOSIS — Z170 Estrogen receptor positive status [ER+]: Secondary | ICD-10-CM | POA: Diagnosis not present

## 2019-02-07 LAB — SARS CORONAVIRUS 2 (TAT 6-24 HRS): SARS Coronavirus 2: NEGATIVE

## 2019-02-07 LAB — GLUCOSE, CAPILLARY: Glucose-Capillary: 131 mg/dL — ABNORMAL HIGH (ref 70–99)

## 2019-02-07 MED ORDER — GABAPENTIN 100 MG PO CAPS: 100.0000 mg | ORAL_CAPSULE | Freq: Two times a day (BID) | ORAL | 0 refills | Status: DC

## 2019-02-07 MED ORDER — SENNA 8.6 MG PO TABS: 1.0000 | ORAL_TABLET | Freq: Two times a day (BID) | ORAL | 0 refills | Status: DC

## 2019-02-07 MED ORDER — OXYCODONE HCL 5 MG PO TABS: 5.0000 mg | ORAL_TABLET | ORAL | 0 refills | Status: DC | PRN

## 2019-02-07 MED ORDER — ALPRAZOLAM 0.25 MG PO TABS: 0.2500 mg | ORAL_TABLET | Freq: Three times a day (TID) | ORAL | 0 refills | Status: DC | PRN

## 2019-02-07 NOTE — Progress Notes (Signed)
Attempted to call and give report x2 to [place]. Left voicemail, awaiting call back. Will notify [facility].

## 2019-02-07 NOTE — Progress Notes (Signed)
Occupational Therapy Treatment Patient Details Name: Suzanne [name] MRN: [id number] DOB: May 22, 1961 Today's Date: 02/07/2019    History of present illness Pt with recent admission for acute on chronic respiratory failure and hx of stage IV breast cancer (Dx May 2020) with diffuse osseous metastatic disease and lungs, chronic diastolic heart failure, anemia and pleural effusion. Pt s/p LUE ORIF 8/14 due to pathological fx.  Pt admitted for back pain on 02/03/19 and found to have T12 pathologic compression fracture   OT comments    Follow Up Recommendations  SNF    Equipment Recommendations  None recommended by OT    Recommendations for Other Services      Precautions / Restrictions Precautions Precautions: Fall;Back Type of Shoulder Precautions: back for comfort Shoulder Interventions: Shoulder sling/immobilizer Required Braces or Orthoses: Sling;Spinal Brace Spinal Brace: (TLSO not fitting) Restrictions LUE Weight Bearing: Non weight bearing Other Position/Activity Restrictions: L UE       Mobility Bed Mobility Overal bed mobility: Needs Assistance Bed Mobility: Supine to Sit     Supine to sit: Min assist;HOB elevated Sit to supine: Min assist   General bed mobility comments: min A to pivot hips to edge of bed  Transfers Overall transfer level: Needs assistance   Transfers: Sit to/from Stand Sit to Stand: Min assist              Balance Overall balance assessment: Needs assistance Sitting-balance support: No upper extremity supported;Feet supported Sitting balance-Leahy Scale: Good     Standing balance support: Single extremity supported Standing balance-Leahy Scale: Poor                             ADL either performed or assessed with clinical judgement   ADL Overall ADL's : Needs assistance/impaired     Grooming: Set up;Sitting   Upper Body Bathing: Minimal assistance Upper Body Bathing Details (indicate cue type and reason): yeast  smell noted under breasts.  RN notified. Pillowcase placed under each breast for moisture wicking                           General ADL Comments: educated pt on importance of keeping under breasts dry after bath or in general as axilla area on operative arm     Vision Patient Visual Report: No change from baseline            Cognition Arousal/Alertness: Awake/alert Behavior During Therapy: WFL for tasks assessed/performed Overall Cognitive Status: Within Functional Limits for tasks assessed                                 General Comments: pt has much brighter affect than last admission        Exercises General Exercises - Upper Extremity Elbow Flexion: AAROM;Left;10 reps;Seated Elbow Extension: AAROM;Left;10 reps;Seated Wrist Flexion: AROM;Left;10 reps;Seated Wrist Extension: AROM;Left;10 reps;Seated Digit Composite Flexion: AROM;Left;10 reps;Seated Composite Extension: AROM;Left;10 reps;Seated           Pertinent Vitals/ Pain       Faces Pain Scale: Hurts a little bit Pain Location: low back Pain Descriptors / Indicators: Sore Pain Intervention(s): Limited activity within patient's tolerance;Monitored during session;Repositioned         Frequency  Min 2X/week        Progress Toward Goals  OT Goals(current goals can now be found in the care  plan section)  Progress towards OT goals: Progressing toward goals     Plan Discharge plan remains appropriate       AM-PAC OT "6 Clicks" Daily Activity     Outcome Measure   Help from another person eating meals?: A Little Help from another person taking care of personal grooming?: A Little Help from another person toileting, which includes using toliet, bedpan, or urinal?: A Lot Help from another person bathing (including washing, rinsing, drying)?: A Little Help from another person to put on and taking off regular upper body clothing?: A Lot Help from another person to put on and taking  off regular lower body clothing?: A Lot 6 Click Score: 15    End of Session    OT Visit Diagnosis: Muscle weakness (generalized) (M62.81)   Activity Tolerance Patient tolerated treatment well   Patient Left with call bell/phone within reach;in bed   Nurse Communication Other (comment)(yeast smell)        Time: 1100-1119 OT Time Calculation (min): 19 min  Charges: OT General Charges $OT Visit: 1 Visit OT Treatments $Self Care/Home Management : 8-22 mins  Kari Baars, [place] [phone] Office- [place], Edwena Felty D 02/07/2019, 1:58 PM

## 2019-02-07 NOTE — TOC Progression Note (Signed)
Transition of Care [facility]) - Progression Note    Patient Details  Name: Suzanne [name] MRN: [id number] Date of Birth: 02/10/1961  Transition of Care [facility]) CM/SW Contact  [address], [place], [place] Phone Number: 02/07/2019, 3:37 PM  Clinical Narrative:    Patient to discharge to [facility] Room 1612. Patient to be transported by [facility]. Patient and patient's daughter informed of plan and agree. Report to be called to [phone].  Chrystal Land, LCSW Clinical Social Work [phone]   Expected Discharge Plan: [place] Barriers to Discharge: Continued Medical Work up, SNF Pending bed offer  Expected Discharge Plan and Services Expected Discharge Plan: [place] In-house Referral: Clinical Social Work     Living arrangements for the past 2 months: Single Family Home Expected Discharge Date: 02/07/19                                     Social Determinants of Health (SDOH) Interventions    Readmission Risk Interventions Readmission Risk Prevention Plan 01/16/2019 12/01/2018 11/17/2018  Transportation Screening Complete Complete Complete  PCP or Specialist Appt within 3-5 Days - - Not Complete  Not Complete comments - - will need pcp set up  [place] or [place] - - Complete  Social Work Consult for [place] Planning/Counseling - - Complete  Palliative Care Screening - - Not Complete  Palliative Care Screening Not Complete Comments - - pending- bedside RN states plan to consult palliative this admission  Medication Review (RN Care Manager) Complete Complete Complete  PCP or Specialist appointment within 3-5 days of discharge Complete Complete -  [place] or Home Care Consult Complete Complete -  SW Recovery Care/Counseling Consult Complete Complete -  Palliative Care Screening Not Applicable Not Applicable -  Skilled Nursing Facility Complete Complete -  Some recent data might be hidden

## 2019-02-07 NOTE — Discharge Summary (Signed)
Physician Discharge Summary  Suzanne [name] [id number] DOB: 1960/10/26 DOA: 02/03/2019  PCP: Patient, No Pcp Per  Admit date: 02/03/2019 Discharge date: 02/07/2019  Admitted From: Home Disposition:  SNF  Recommendations for Outpatient Follow-up:  1. Follow up with PCP in 1-2 weeks 2. Follow up with Oncology as scheduled 3.  Follow up with Dr. 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 The patient reports several days of pain in the mid to upper back that has been rapidly worsening and severe to the point where she is unable to move without exacerbating the pain. She denied any incontinence, weakness, or paresthesias associated with this. She has not noted any fevers. 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 Her treatment is also been complicated by anemia requiring transfusions. She was discharged from the hospital just over a week ago and SNF was recommended at that time, but due to lack of bed availability, she went home with home health.She reports having adequate social support and states that her daughter works very hard to help her at home, but the patient is concerned that with her increase independence for basic ADLs, she will require more assistance than her daughter can provide. She denies any change in her chronic cough and SOB, reports starting azithromycin a couple days ago due to elevated WBC.   Discharge Diagnoses:  Principal Problem:   Intractable back pain Active Problems:   Hypokalemia    Macrocytic anemia   Malignant neoplasm of overlapping sites of right breast in female, estrogen receptor positive (HCC)   Pain from bone metastases (HCC)   Chronic respiratory failure with hypoxia and hypercapnia (HCC)   Thoracic compression fracture, closed, initial encounter ([place])   Essential hypertension   1.Intractable back pain; diffuse osseous metastatic disease -Presents with several days of severe back pain despite using oxycodone at home, denies inciting event, denies weakness/paresthesia/incontinence, and is found to have diffuse osseous metastatic disease on CT with subacute or acute T12 compression deformity -Neurosurgery was consulted by ED physician, recs noted. No need for surgery at this time - Oncology recommended IR consult for consideration for kyphoplasty. IR does not consider pt a good candidate for kyphoplasty - Pt seen by PT. Per therapy, "Pt currently with functional limitations due to" multiple presenting deficits and "will benefit from skilled PT to increase their independence and safety with mobility to allow discharge." PT also reports an "Inaccessible home environment" with "decreased caregiver support" - Pain seems stable at present on analgesics. Plan d/c to SNF  2.Metastatic breast cancer -Presented in May 2020 with diffuse metastases at that time -She is being treated with anastrozole, palbociclib, and Xgeva - Oncology following. Recs appreciated  3.Hypokalemia -Serum potassium was 2.7 in ED - Corrected - Labs reviewed  4.Hypertension -BP at goal, continue Coreg - Presently stable  5.Chronic hypoxic respiratory failure -Attributed to malignant pleural effusions and stable on 2 Lpm withno acuterespiratory complaints  - Currently stable  6.Macrocytic anemia -Hgb is 9.8 on admission -Appears to be stable withnobleeding, likely secondary to chemotherapy  - Presently stable  7. Debility  - SNF was recommended at  time of recent  hospital discharge but no beds were available and patient went home with home health  - Unfortunately, her condition has continued to decline andpatient does not feel that her daughter will be able to provide all the assistance she now requires despite her best efforts  -PT following, recommendation for SNF per above  8. Constipation -Pt reports last BM was over one week ago -Initially no results with multiple cathartics. Ultimately good result with soap suds enema.    Discharge Instructions   Allergies as of 02/07/2019   No Known Allergies     Medication List    STOP taking these medications   azithromycin 250 MG tablet Commonly known as: Zithromax Z-Pak     TAKE these medications   albuterol 108 (90 Base) MCG/ACT inhaler Commonly known as: VENTOLIN HFA Inhale 2 puffs into the lungs every 6 (six) hours as needed for wheezing or shortness of breath.   ALPRAZolam 0.25 MG tablet Commonly known as: XANAX Take 1-2 tablets (0.25-0.5 mg total) by mouth every 8 (eight) hours as needed for anxiety. Prescription needed for SNF placement What changed: additional instructions   anastrozole 1 MG tablet Commonly known as: ARIMIDEX Take 1 tablet (1 mg total) by mouth daily.   antiseptic oral rinse Liqd 15 mLs by Mouth Rinse route as needed for dry mouth.   benzonatate 200 MG capsule Commonly known as: TESSALON TAKE 1 CAPSULE BY MOUTH 3 TIMES DAILY IF NEEDED What changed: See the new instructions.   calcium-vitamin D 500-200 MG-UNIT tablet Commonly known as: OSCAL WITH D Take 1 tablet by mouth 3 (three) times daily.   carvedilol 3.125 MG tablet Commonly known as: COREG Take 1 tablet (3.125 mg total) by mouth 2 (two) times daily with a meal.   furosemide 20 MG tablet Commonly known as: LASIX Take 2 tablets (40 mg total) by mouth 2 (two) times daily.   gabapentin 100 MG capsule Commonly known as: NEURONTIN Take 1 capsule (100 mg total) by mouth 2 (two)  times daily.   loratadine 10 MG tablet Commonly known as: CLARITIN Take 1 tablet (10 mg total) by mouth daily.   nystatin powder Commonly known as: MYCOSTATIN/NYSTOP Apply topically 3 (three) times daily. Apply under breast area   oxyCODONE 5 MG immediate release tablet Commonly known as: Oxy IR/ROXICODONE Take 1 tablet (5 mg total) by mouth every 4 (four) hours as needed for moderate pain. Prescription needed for SNF placement What changed:   how much to take  reasons to take this  additional instructions   palbociclib 100 MG capsule Commonly known as: IBRANCE Take 1 capsule (100 mg total) by mouth daily with breakfast. Take whole with food. Take for 21 days on, 7 days off, repeat every 28 days. What changed: additional instructions   polyethylene glycol 17 g packet Commonly known as: MIRALAX / GLYCOLAX Take 17 g by mouth as needed for constipation.   senna 8.6 MG Tabs tablet Commonly known as: SENOKOT Take 1 tablet (8.6 mg total) by mouth 2 (two) times daily.      Follow-up Information    Earnie Larsson, MD. Schedule an appointment as soon as possible for a visit in 6 week(s).   Specialty: Neurosurgery Contact information: [address] [place] [address] [phone]        [name], Virgie Dad, MD Follow up.   Specialty: Oncology Why: as scheduled Contact information: [place] [address] [phone]        Netta Cedars,  MD Follow up.   Specialty: Orthopedic Surgery Why: as scheduled Contact information: [address] [place] [address] [phone]          No Known Allergies  Consultations:  Oncology  IR  Procedures/Studies: Dg Chest 1 View  Result Date: 01/20/2019 CLINICAL DATA:  Status post right thoracentesis. History of metastatic breast cancer EXAM: CHEST  1 VIEW COMPARISON:  01/17/2019 FINDINGS: Stable cardiomediastinal contours. Decrease right-sided pleural  effusion. Lung volumes are low. No appreciable pneumothorax. A right-sided PICC line is present. Surgical fixation hardware in the proximal left humerus. Extensive osseous metastatic disease throughout the visualized thorax. IMPRESSION: Interval decrease in a right-sided pleural effusion. No pneumothorax identified. Electronically Signed   By: Davina Poke M.D.   On: 01/20/2019 15:54   Dg Lumbar Spine Complete  Result Date: 02/04/2019 CLINICAL DATA:  Back pain EXAM: LUMBAR SPINE - COMPLETE 4+ VIEW COMPARISON:  CT 02/03/2019 FINDINGS: Diffuse sclerosis and lytic process, consistent with skeletal metastatic disease. Acute kyphosis centered at T11-T12 with moderate compression deformities at T11 and T12, loss of the intervertebral disc space. Remaining lumbar vertebra demonstrate normal stature. Degenerative changes at L1-L2. Aortic atherosclerosis. Right L1 transverse process fracture is better seen on the recent CT. IMPRESSION: Extensive, diffuse skeletal metastatic disease. Focal kyphosis centered at T11-T12 with compression deformities at T11 and T12. Electronically Signed   By: Donavan Foil M.D.   On: 02/04/2019 19:31   Ct Thoracic Spine Wo Contrast  Result Date: 02/03/2019 CLINICAL DATA:  Mid back pain/T-spine pain, initial exam. EXAM: CT THORACIC SPINE WITHOUT CONTRAST TECHNIQUE: Multidetector CT images of the thoracic were obtained using the standard protocol without intravenous contrast. COMPARISON:  CT angiography chest 12/25/2018 FINDINGS: Alignment: Focal kyphosis centered at the T11-T12 level. Trace T12 bony retropulsion. Vertebrae: Redemonstrated marked diffuse osseous heterogeneity with multifocal sclerosis and lytic changes. Findings are consistent with diffuse osseous metastatic disease. New from prior chest CT 12/25/2018, there is a T12 anterior wedge compression deformity with approximately 50% loss of vertebral body height and trace bony retropulsion. Mild loss of T11 vertebral body height  is also questioned, and this was not definitively present on prior examination. Widening of the T10-T11 and T11-T12 interspinous spaces appear similar to prior exam. Trace T12 bony retropulsion and T11-T12 gibbus deformity results in mild to moderate bony narrowing of the spinal canal at the T11-T12 level. At the remaining levels, no high-grade bony spinal canal stenosis or neural foraminal narrowing. Vertebral body height is otherwise maintained. No other definite acute fracture to the thoracic spine is identified. Redemonstrated are multiple bilateral subacute pathologic rib fractures. Paraspinal and other soft tissues: Soft tissue prominence surrounding the T12 vertebra, which may reflect extra ertebral extension of metastatic disease. Small bilateral pleural effusion are similar to prior examination. Redemonstrated bilateral bandlike opacities which may reflect scar or fibrosis. Hiatal hernia containing what appears to be enteric contrast. Cholecystolithiasis Disc levels: Multilevel loss of intervertebral disc height. Thoracic spondylosis. Apart from mild to moderate narrowing at the T11-T12 level related to focal gibbus deformity and trace T12 bony retropulsion, no high-grade bony spinal canal stenosis or neural foraminal narrowing at the remaining levels. IMPRESSION: Redemonstrated diffuse osseous metastatic disease. New from prior chest CT 12/25/2018, there is an acute/subacute T12 compression deformity with approximately 50% loss of vertebral body height and trace bony retropulsion. Mild loss of T11 vertebral body height is also questioned, and new from prior examination. Resultant focal kyphosis at the T11-T12 level with mild to moderate spinal canal  narrowing. Widening of the T10-T11 and T12-L1 interspinous spaces appears similar to prior examination, but MRI may be obtained for further evaluation to definitively exclude ligamentous injury. Soft tissue prominence surrounding the T12 vertebra which may  reflect extra vertebral extension of metastatic disease. Consider contrast-enhanced MRI for further evaluation, as clinically warranted. No other definite acute fracture to the thoracic spine. Redemonstrated are multiple nonacute bilateral pathologic rib fractures. Small bilateral pleural effusions. Hiatal hernia Cholecystolithiasis. Electronically Signed   By: Kellie Simmering   On: 02/03/2019 18:13   Ct Lumbar Spine Wo Contrast  Result Date: 02/03/2019 CLINICAL DATA:  Back pain, osteoporosis and chronic steroid use. EXAM: CT LUMBAR SPINE WITHOUT CONTRAST TECHNIQUE: Multidetector CT imaging of the lumbar spine was performed without intravenous contrast administration. Multiplanar CT image reconstructions were also generated. COMPARISON:  CT angiography chest 12/25/2018, FINDINGS: Segmentation: For the purposes of this examination, 5 lumbar vertebrae are assumed and the caudal most well-formed intervertebral disc is designated L5-S1. Alignment: Trace L1-L2 and L2-L3 grade 1 retrolisthesis. Vertebrae: There is marked diffuse osseous heterogeneity with multifocal sclerotic and lytic changes. Findings are consistent with diffuse osseous metastatic disease. No lumbar compression deformity. Subacute appearing fracture of the right L1 transverse process. No other definite fracture of the lumbar spine is identified. Subacute appearing fracture of the posterior left twelfth rib. Paraspinal and other soft tissues: The imaged paraspinal soft tissues are unremarkable. Aortoiliac atherosclerotic disease. Disc levels: Moderate L1-L2 disc degeneration. Intervertebral disc height is otherwise preserved within the lumbar spine. Lumbar spondylosis without high-grade bony spinal canal or neural foraminal stenosis. IMPRESSION: Findings consistent with diffuse osseous metastatic disease. No lumbar compression deformity. Subacute appearing fracture of the right L1 transverse process. No other definite fracture of the lumbar spine is  identified. Subacute appearing fracture of the posterior left twelfth rib. Electronically Signed   By: Kellie Simmering   On: 02/03/2019 17:48   Dg Chest Port 1 View  Result Date: 01/17/2019 CLINICAL DATA:  Shortness of breath, cough, metastatic breast cancer, malignant pleural effusion EXAM: PORTABLE CHEST 1 VIEW COMPARISON:  01/13/2019 FINDINGS: No significant change in AP portable examination with large right, moderate left pleural effusions and associated atelectasis or consolidation. No new airspace opacity. The heart and mediastinum are predominantly obscured. Extensive sclerotic osseous metastatic disease with plate and screw fixation of the proximal left humerus. Right upper extremity PICC. IMPRESSION: No significant change in AP portable examination with large right, moderate left pleural effusions and associated atelectasis or consolidation. No new airspace opacity. The heart and mediastinum are predominantly obscured. Extensive sclerotic osseous metastatic disease with plate and screw fixation of the proximal left humerus. Right upper extremity PICC. Electronically Signed   By: Eddie Candle M.D.   On: 01/17/2019 14:42   Dg Chest Port 1 View  Result Date: 01/13/2019 CLINICAL DATA:  Aspiration into respiratory tract. Acute on chronic respiratory failure. EXAM: PORTABLE CHEST 1 VIEW COMPARISON:  Multiple prior exams most recently earlier this day. FINDINGS: Patient's chin obscures the apices. Right upper extremity PICC with tip unchanged. Progressive hazy opacity at the lung bases likely pleural effusions. Increasing vascular congestion. Grossly unchanged heart size and mediastinal contours. No pneumothorax. Osseous metastatic disease with recent surgical fixation of the left proximal humerus. IMPRESSION: Progressive hazy opacity at the lung bases likely pleural effusions. Increasing vascular congestion. Electronically Signed   By: Keith Rake M.D.   On: 01/13/2019 23:11   Dg Chest Port 1  View  Result Date: 01/13/2019 CLINICAL DATA:  Atelectasis EXAM:  PORTABLE CHEST 1 VIEW COMPARISON:  01/10/2019 FINDINGS: Cardiac shadow is stable. Endotracheal tube is been removed as has a gastric catheter. Right-sided PICC line remains in satisfactory position. Elevation of the right hemidiaphragm is again seen and stable. Bibasilar atelectatic changes are noted increased from the prior study. Postsurgical changes in the left shoulder are noted. IMPRESSION: Interval removal of tubes and lines. Slight increase in bibasilar atelectasis. Electronically Signed   By: Inez Catalina M.D.   On: 01/13/2019 07:20   Dg Chest Port 1 View  Result Date: 01/10/2019 CLINICAL DATA:  Intubation and OG tube placement. EXAM: PORTABLE CHEST 1 VIEW COMPARISON:  Radiograph yesterday. FINDINGS: Endotracheal tube tip 2.4 cm from the carina. Enteric tube tip below the diaphragm not included in the field of view. Tip of the right upper extremity PICC in the upper SVC. Low lung volumes persist with slight decrease patchy opacities from prior exam. Persistent blunting of both costophrenic angles. Unchanged heart size and mediastinal contours. Bony metastatic disease again seen. Recent left humeral fixation is partially included. IMPRESSION: 1. Endotracheal tube tip 2.4 cm from the carina. Enteric tube tip below the diaphragm not included in the field of view. 2. Persistent low lung volumes. Slight improvement in bilateral patchy opacities from prior exam. Electronically Signed   By: Keith Rake M.D.   On: 01/10/2019 01:06   Dg Chest Port 1 View  Result Date: 01/09/2019 CLINICAL DATA:  Shortness of breath. EXAM: PORTABLE CHEST 1 VIEW COMPARISON:  Radiograph 01/05/2019, chest CT 12/25/2018 FINDINGS: Very low lung volumes limit assessment, lower lung volumes from prior exam. Right upper extremity PICC tip in the SVC. Cardiomediastinal contours are grossly stable, not well visualized due to low lung volumes. Progression in bilateral  lung opacities, right perihilar and left lung base. Blunting of the costophrenic angles likely effusions. Osseous metastatic disease again seen. IMPRESSION: 1. Very low lung volumes, limited assessment lower lung volumes from prior exam. 2. Progression in bilateral lung opacities, right perihilar and left lung base, may be combination of pulmonary edema and pneumonia. Suspected pleural effusions. Electronically Signed   By: Keith Rake M.D.   On: 01/09/2019 22:14   Dg Shoulder Left Port  Result Date: 01/09/2019 CLINICAL DATA:  Postop EXAM: LEFT SHOULDER - 1 VIEW COMPARISON:  01/05/2019 FINDINGS: Interval surgical plate and multiple screw fixation of the left humerus for pathologic proximal humerus fracture. Near anatomic alignment. No humeral head dislocation. Numerous lytic and sclerotic lesions consistent with metastatic disease. Amorphous opacities the proximal humerus at the level of the fracture, presumed to represent surgical packing material. IMPRESSION: Interval surgical plate and multiple screw fixation of proximal left humerus fracture with expected operative changes. Numerous lytic and sclerotic osseous lesions, consistent with skeletal metastatic disease Electronically Signed   By: Donavan Foil M.D.   On: 01/09/2019 21:13   Dg Abd Portable 1v  Result Date: 01/10/2019 CLINICAL DATA:  OG tube placement. EXAM: PORTABLE ABDOMEN - 1 VIEW COMPARISON:  None. FINDINGS: Tip and side port of the enteric tube below the diaphragm in the stomach. Relative paucity of upper abdominal bowel gas. No evidence of free air or obstruction. IMPRESSION: Tip and side port of the enteric tube below the diaphragm in the stomach. Electronically Signed   By: Keith Rake M.D.   On: 01/10/2019 01:07   US Thoracentesis Asp Pleural Space W/img Guide  Result Date: 01/20/2019 INDICATION: Patient with history of metastatic breast cancer, recurrent malignant right pleural effusion. Request to IR for diagnostic and  therapeutic right sided thoracentesis today. EXAM: ULTRASOUND GUIDED RIGHT THORACENTESIS MEDICATIONS: 10 mL 1% lidocaine COMPLICATIONS: None immediate. PROCEDURE: An ultrasound guided thoracentesis was thoroughly discussed with the patient and questions answered. The benefits, risks, alternatives and complications were also discussed. The patient understands and wishes to proceed with the procedure. Written consent was obtained. Ultrasound was performed to localize and mark an adequate pocket of fluid in the right chest. The area was then prepped and draped in the normal sterile fashion. 1% Lidocaine was used for local anesthesia. Under ultrasound guidance a 6 Fr Safe-T-Centesis catheter was introduced. No pleural fluid was able to be aspirated. Patient with significant pain in her back and left shoulder which was present prior to the procedure and was unchanged during the procedure - she is unable to lay on her left side due to recent open reduction internal fixation of the left humerus. Due to significant pain patient requested that procedure be aborted. The catheter was removed and a dressing applied. FINDINGS: No pleural fluid obtained. IMPRESSION: Unsuccessful ultrasound guided right thoracentesis yielding no of pleural fluid. Read by Candiss Norse, PA-C Electronically Signed   By: Jacqulynn Cadet M.D.   On: 01/20/2019 15:54    Subjective: Eager to go to SNF  Discharge Exam: Vitals:   02/06/19 2133 02/07/19 0557  BP: 108/77 104/68  Pulse: 84 69  Resp: 16 15  Temp: 97.6 F (36.4 C) (!) 97.5 F (36.4 C)  SpO2: 98% 98%   Vitals:   02/06/19 1414 02/06/19 1654 02/06/19 2133 02/07/19 0557  BP: 127/68 122/69 108/77 104/68  Pulse: 95 85 84 69  Resp: 18 18 16 15   Temp: 97.8 F (36.6 C) 98.4 F (36.9 C) 97.6 F (36.4 C) (!) 97.5 F (36.4 C)  TempSrc: Oral Oral Oral Oral  SpO2: 96% 97% 98% 98%  Weight:  75.8 kg    Height:  5\' 3"  (1.6 m)      General: Pt is alert, awake, not in acute  distress Cardiovascular: RRR, S1/S2 +, no rubs, no gallops Respiratory: CTA bilaterally, no wheezing, no rhonchi Abdominal: Soft, NT, ND, bowel sounds + Extremities: no edema, no cyanosis   The results of significant diagnostics from this hospitalization (including imaging, microbiology, ancillary and laboratory) are listed below for reference.     Microbiology: Recent Results (from the past 240 hour(s))  SARS Coronavirus 2 [facility] order, Performed in [facility] hospital lab) Nasopharyngeal Nasopharyngeal Swab     Status: None   Collection Time: 02/03/19  7:22 PM   Specimen: Nasopharyngeal Swab  Result Value Ref Range Status   SARS Coronavirus 2 NEGATIVE NEGATIVE Final    Comment: (NOTE) If result is NEGATIVE SARS-CoV-2 target nucleic acids are NOT DETECTED. The SARS-CoV-2 RNA is generally detectable in upper and lower  respiratory specimens during the acute phase of infection. The lowest  concentration of SARS-CoV-2 viral copies this assay can detect is 250  copies / mL. A negative result does not preclude SARS-CoV-2 infection  and should not be used as the sole basis for treatment or other  patient management decisions.  A negative result may occur with  improper specimen collection / handling, submission of specimen other  than nasopharyngeal swab, presence of viral mutation(s) within the  areas targeted by this assay, and inadequate number of viral copies  (<250 copies / mL). A negative result must be combined with clinical  observations, patient history, and epidemiological information. If result is POSITIVE SARS-CoV-2 target nucleic acids are DETECTED. The SARS-CoV-2  RNA is generally detectable in upper and lower  respiratory specimens dur ing the acute phase of infection.  Positive  results are indicative of active infection with SARS-CoV-2.  Clinical  correlation with patient history and other diagnostic information is  necessary to determine patient infection status.   Positive results do  not rule out bacterial infection or co-infection with other viruses. If result is PRESUMPTIVE POSTIVE SARS-CoV-2 nucleic acids MAY BE PRESENT.   A presumptive positive result was obtained on the submitted specimen  and confirmed on repeat testing.  While 2019 novel coronavirus  (SARS-CoV-2) nucleic acids may be present in the submitted sample  additional confirmatory testing may be necessary for epidemiological  and / or clinical management purposes  to differentiate between  SARS-CoV-2 and other Sarbecovirus currently known to infect humans.  If clinically indicated additional testing with an alternate test  methodology [phone]) is advised. The SARS-CoV-2 RNA is generally  detectable in upper and lower respiratory sp ecimens during the acute  phase of infection. The expected result is Negative. Fact Sheet for Patients:  StrictlyIdeas.no Fact Sheet for Healthcare Providers: BankingDealers.co.za This test is not yet approved or cleared by the [place] FDA and has been authorized for detection and/or diagnosis of SARS-CoV-2 by FDA under an Emergency Use Authorization (EUA).  This EUA will remain in effect (meaning this test can be used) for the duration of the COVID-19 declaration under Section 564(b)(1) of the Act, 21 U.S.C. section 360bbb-3(b)(1), unless the authorization is terminated or revoked sooner. Performed at [facility], [place] [address] [place], [place] [address]   SARS CORONAVIRUS 2 (TAT 6-24 HRS) Nasopharyngeal Nasopharyngeal Swab     Status: None   Collection Time: 02/06/19  2:08 PM   Specimen: Nasopharyngeal Swab  Result Value Ref Range Status   SARS Coronavirus 2 NEGATIVE NEGATIVE Final    Comment: (NOTE) SARS-CoV-2 target nucleic acids are NOT DETECTED. The SARS-CoV-2 RNA is generally detectable in upper and lower respiratory specimens during the acute phase of infection.  Negative results do not preclude SARS-CoV-2 infection, do not rule out co-infections with other pathogens, and should not be used as the sole basis for treatment or other patient management decisions. Negative results must be combined with clinical observations, patient history, and epidemiological information. The expected result is Negative. Fact Sheet for Patients: SugarRoll.be Fact Sheet for Healthcare Providers: https://www.woods-mathews.com/ This test is not yet approved or cleared by the [place] FDA and  has been authorized for detection and/or diagnosis of SARS-CoV-2 by FDA under an Emergency Use Authorization (EUA). This EUA will remain  in effect (meaning this test can be used) for the duration of the COVID-19 declaration under Section 56 4(b)(1) of the Act, 21 U.S.C. section 360bbb-3(b)(1), unless the authorization is terminated or revoked sooner. Performed at [facility], [place] [address], [place], [place] [address]      Labs: BNP (last 3 results) Recent Labs    10/26/18 1921 11/14/18 1621 12/25/18 1009  BNP 48.6 37.5 [id number]   Basic Metabolic Panel: Recent Labs  Lab 02/03/19 1621 02/04/19 0325 02/04/19 0539 02/05/19 0840  NA 141 QUESTIONABLE RESULTS, RECOMMEND RECOLLECT TO VERIFY 139 140  K 2.7* QUESTIONABLE RESULTS, RECOMMEND RECOLLECT TO VERIFY 2.9* 4.3  CL 91* QUESTIONABLE RESULTS, RECOMMEND RECOLLECT TO VERIFY 92* 96*  CO2 35* QUESTIONABLE RESULTS, RECOMMEND RECOLLECT TO VERIFY 33* 32  GLUCOSE 122* QUESTIONABLE RESULTS, RECOMMEND RECOLLECT TO VERIFY 127* 154*  BUN 12 QUESTIONABLE RESULTS, RECOMMEND RECOLLECT TO VERIFY  15 23*  CREATININE 0.83 QUESTIONABLE RESULTS, RECOMMEND RECOLLECT TO VERIFY 0.81 0.91  CALCIUM 8.9 QUESTIONABLE RESULTS, RECOMMEND RECOLLECT TO VERIFY 8.9 8.4*  MG  --  QUESTIONABLE RESULTS, RECOMMEND RECOLLECT TO VERIFY 2.8*  --    Liver Function Tests: No results for input(s): AST, ALT,  ALKPHOS, BILITOT, PROT, ALBUMIN in the last 168 hours. No results for input(s): LIPASE, AMYLASE in the last 168 hours. No results for input(s): AMMONIA in the last 168 hours. CBC: Recent Labs  Lab 02/03/19 1621 02/04/19 0325 02/04/19 0539 02/05/19 0840  WBC 4.9 QUESTIONABLE RESULTS, RECOMMEND RECOLLECT TO VERIFY 4.2 5.6  NEUTROABS 3.2 QUESTIONABLE RESULTS, RECOMMEND RECOLLECT TO VERIFY 3.1  --   HGB 9.8* QUESTIONABLE RESULTS, RECOMMEND RECOLLECT TO VERIFY 9.5* 8.6*  HCT 33.1* QUESTIONABLE RESULTS, RECOMMEND RECOLLECT TO VERIFY 32.0* 29.0*  MCV 106.1* QUESTIONABLE RESULTS, RECOMMEND RECOLLECT TO VERIFY 108.5* 109.8*  PLT 456* QUESTIONABLE RESULTS, RECOMMEND RECOLLECT TO VERIFY 408* 423*   Cardiac Enzymes: No results for input(s): CKTOTAL, CKMB, CKMBINDEX, TROPONINI in the last 168 hours. BNP: Invalid input(s): POCBNP CBG: Recent Labs  Lab 02/04/19 0741 02/05/19 0739 02/06/19 0757 02/07/19 0752  GLUCAP 127* 162* 129* 131*   D-Dimer No results for input(s): DDIMER in the last 72 hours. Hgb A1c No results for input(s): HGBA1C in the last 72 hours. Lipid Profile No results for input(s): CHOL, HDL, LDLCALC, TRIG, CHOLHDL, LDLDIRECT in the last 72 hours. Thyroid function studies No results for input(s): TSH, T4TOTAL, T3FREE, THYROIDAB in the last 72 hours.  Invalid input(s): FREET3 Anemia work up No results for input(s): VITAMINB12, FOLATE, FERRITIN, TIBC, IRON, RETICCTPCT in the last 72 hours. Urinalysis    Component Value Date/Time   COLORURINE STRAW (A) 01/15/2019 1150   APPEARANCEUR CLEAR 01/15/2019 1150   LABSPEC 1.008 01/15/2019 1150   PHURINE 6.0 01/15/2019 1150   GLUCOSEU NEGATIVE 01/15/2019 1150   HGBUR NEGATIVE 01/15/2019 [place] 01/15/2019 1150   KETONESUR NEGATIVE 01/15/2019 1150   PROTEINUR NEGATIVE 01/15/2019 1150   NITRITE NEGATIVE 01/15/2019 1150   LEUKOCYTESUR TRACE (A) 01/15/2019 1150   Sepsis Labs Invalid input(s):  PROCALCITONIN,  WBC,  LACTICIDVEN Microbiology Recent Results (from the past 240 hour(s))  SARS Coronavirus 2 [facility] order, Performed in [facility] hospital lab) Nasopharyngeal Nasopharyngeal Swab     Status: None   Collection Time: 02/03/19  7:22 PM   Specimen: Nasopharyngeal Swab  Result Value Ref Range Status   SARS Coronavirus 2 NEGATIVE NEGATIVE Final    Comment: (NOTE) If result is NEGATIVE SARS-CoV-2 target nucleic acids are NOT DETECTED. The SARS-CoV-2 RNA is generally detectable in upper and lower  respiratory specimens during the acute phase of infection. The lowest  concentration of SARS-CoV-2 viral copies this assay can detect is 250  copies / mL. A negative result does not preclude SARS-CoV-2 infection  and should not be used as the sole basis for treatment or other  patient management decisions.  A negative result may occur with  improper specimen collection / handling, submission of specimen other  than nasopharyngeal swab, presence of viral mutation(s) within the  areas targeted by this assay, and inadequate number of viral copies  (<250 copies / mL). A negative result must be combined with clinical  observations, patient history, and epidemiological information. If result is POSITIVE SARS-CoV-2 target nucleic acids are DETECTED. The SARS-CoV-2 RNA is generally detectable in upper and lower  respiratory specimens dur ing the acute phase of infection.  Positive  results are indicative of  active infection with SARS-CoV-2.  Clinical  correlation with patient history and other diagnostic information is  necessary to determine patient infection status.  Positive results do  not rule out bacterial infection or co-infection with other viruses. If result is PRESUMPTIVE POSTIVE SARS-CoV-2 nucleic acids MAY BE PRESENT.   A presumptive positive result was obtained on the submitted specimen  and confirmed on repeat testing.  While 2019 novel coronavirus  (SARS-CoV-2)  nucleic acids may be present in the submitted sample  additional confirmatory testing may be necessary for epidemiological  and / or clinical management purposes  to differentiate between  SARS-CoV-2 and other Sarbecovirus currently known to infect humans.  If clinically indicated additional testing with an alternate test  methodology [phone]) is advised. The SARS-CoV-2 RNA is generally  detectable in upper and lower respiratory sp ecimens during the acute  phase of infection. The expected result is Negative. Fact Sheet for Patients:  StrictlyIdeas.no Fact Sheet for Healthcare Providers: BankingDealers.co.za This test is not yet approved or cleared by the [place] FDA and has been authorized for detection and/or diagnosis of SARS-CoV-2 by FDA under an Emergency Use Authorization (EUA).  This EUA will remain in effect (meaning this test can be used) for the duration of the COVID-19 declaration under Section 564(b)(1) of the Act, 21 U.S.C. section 360bbb-3(b)(1), unless the authorization is terminated or revoked sooner. Performed at [facility], [place] [address], [place], [place] [address]   SARS CORONAVIRUS 2 (TAT 6-24 HRS) Nasopharyngeal Nasopharyngeal Swab     Status: None   Collection Time: 02/06/19  2:08 PM   Specimen: Nasopharyngeal Swab  Result Value Ref Range Status   SARS Coronavirus 2 NEGATIVE NEGATIVE Final    Comment: (NOTE) SARS-CoV-2 target nucleic acids are NOT DETECTED. The SARS-CoV-2 RNA is generally detectable in upper and lower respiratory specimens during the acute phase of infection. Negative results do not preclude SARS-CoV-2 infection, do not rule out co-infections with other pathogens, and should not be used as the sole basis for treatment or other patient management decisions. Negative results must be combined with clinical observations, patient history, and epidemiological information.  The expected result is Negative. Fact Sheet for Patients: SugarRoll.be Fact Sheet for Healthcare Providers: https://www.woods-mathews.com/ This test is not yet approved or cleared by the [place] FDA and  has been authorized for detection and/or diagnosis of SARS-CoV-2 by FDA under an Emergency Use Authorization (EUA). This EUA will remain  in effect (meaning this test can be used) for the duration of the COVID-19 declaration under Section 56 4(b)(1) of the Act, 21 U.S.C. section 360bbb-3(b)(1), unless the authorization is terminated or revoked sooner. Performed at [facility], [place] [address] [place], [place] [address]    Time spent: 30 min  SIGNED:   Marylu Lund, MD  [facility] 02/07/2019, 1:33 PM  If 7PM-7AM, please contact night-coverage

## 2019-02-09 ENCOUNTER — Telehealth: Payer: Self-pay | Admitting: *Deleted

## 2019-02-09 NOTE — Telephone Encounter (Addendum)
This RN received faxed communication from [facility] that pt's daughter ,[name], called the service yesterday (9/13) stating the SNF did not have an order to given patient a medication.  No further information or follow up was on communication.  [name] called to this RN at approximately 11am stating above concern as well as verifying the medication as Ibrance.  This RN verified per d/c note with [place] facility is [facility].  This RN contacted [facility] at [phone] and spoke with nurse attending pt today- she states " yes - I just went in there and she handed me the medication and told me she needed to take it "  This RN discussed medication with goal of therapy is 1 tablet daily for 21 days on with 7 days off. This RN requested nurse to verify current medication on hand with name, mg and number left in blister pack.  Nurse will return call to this RN( direct desk number given ) with above information so an order can be faxed to [phone].  Of note pt also needs follow up appointment in this office- need above information to schedule appointment at time of restart for best outcome.  Will await information to complete communication.  Elmyra Ricks RN at [facility] called to verify pt has a blister pack of Ibrance 100 mg with 6 doses left.  Per MD recommendation- this RN faxed order to [facility] for pt to resume and complete current 6 doses then hold until further orders.  Appointment scheduled and verbalized as well as written on order for 9/24 with request for transportation to be arranged via pt's current facility.

## 2019-02-10 DIAGNOSIS — C7951 Secondary malignant neoplasm of bone: Secondary | ICD-10-CM | POA: Diagnosis not present

## 2019-02-10 DIAGNOSIS — R5381 Other malaise: Secondary | ICD-10-CM | POA: Diagnosis not present

## 2019-02-10 DIAGNOSIS — C50811 Malignant neoplasm of overlapping sites of right female breast: Secondary | ICD-10-CM | POA: Diagnosis not present

## 2019-02-10 DIAGNOSIS — G893 Neoplasm related pain (acute) (chronic): Secondary | ICD-10-CM | POA: Diagnosis not present

## 2019-02-11 DIAGNOSIS — M5489 Other dorsalgia: Secondary | ICD-10-CM | POA: Diagnosis not present

## 2019-02-11 DIAGNOSIS — Z7401 Bed confinement status: Secondary | ICD-10-CM | POA: Diagnosis not present

## 2019-02-11 DIAGNOSIS — R5381 Other malaise: Secondary | ICD-10-CM | POA: Diagnosis not present

## 2019-02-11 DIAGNOSIS — Z4789 Encounter for other orthopedic aftercare: Secondary | ICD-10-CM | POA: Diagnosis not present

## 2019-02-11 DIAGNOSIS — M2550 Pain in unspecified joint: Secondary | ICD-10-CM | POA: Diagnosis not present

## 2019-02-11 DIAGNOSIS — R52 Pain, unspecified: Secondary | ICD-10-CM | POA: Diagnosis not present

## 2019-02-12 ENCOUNTER — Inpatient Hospital Stay: Payer: Self-pay | Admitting: Internal Medicine

## 2019-02-12 ENCOUNTER — Telehealth: Payer: Self-pay | Admitting: *Deleted

## 2019-02-12 DIAGNOSIS — M549 Dorsalgia, unspecified: Secondary | ICD-10-CM | POA: Diagnosis not present

## 2019-02-12 DIAGNOSIS — R5381 Other malaise: Secondary | ICD-10-CM | POA: Diagnosis not present

## 2019-02-12 DIAGNOSIS — R6889 Other general symptoms and signs: Secondary | ICD-10-CM | POA: Diagnosis not present

## 2019-02-12 DIAGNOSIS — E876 Hypokalemia: Secondary | ICD-10-CM | POA: Diagnosis not present

## 2019-02-12 DIAGNOSIS — S22080A Wedge compression fracture of T11-T12 vertebra, initial encounter for closed fracture: Secondary | ICD-10-CM | POA: Diagnosis not present

## 2019-02-12 DIAGNOSIS — I10 Essential (primary) hypertension: Secondary | ICD-10-CM | POA: Diagnosis not present

## 2019-02-12 NOTE — Telephone Encounter (Signed)
Let's arrange a televisit given patient probably would be difficult to transport/pain given spine fracture

## 2019-02-12 NOTE — Telephone Encounter (Signed)
RN spoke with patient's daughter Suzanne [name] about today's hospital follow up appointment.  Patient was admitted recently to [facility] with pathologic spine fracture, discharged to [facility] [phone]). Patient needs AM appointment if she is to be seen in person, as she needs transport by ambulance.

## 2019-02-12 NOTE — Telephone Encounter (Signed)
Will need to schedule with daughter/nursing home [facility] [phone]).  May need assistance from nurse in SNF during visit per Dr Baxter Flattery.

## 2019-02-13 NOTE — Telephone Encounter (Signed)
Called facility and arranged evisit. Per scheduler call the main office [phone] and ask for nurse working with patient and nurse can transfer call to cordless phone . Called Daughter [name] and made her aware of new appointment date/time and she will also be available for dial into phone visit via 3 way being that she is unable to be at bedside in facility due to Covid restrictions. She can be researched at [phone]. All number noted in appointment notes.  Suzanne [name]

## 2019-02-17 DIAGNOSIS — Z20828 Contact with and (suspected) exposure to other viral communicable diseases: Secondary | ICD-10-CM | POA: Diagnosis not present

## 2019-02-18 ENCOUNTER — Ambulatory Visit (INDEPENDENT_AMBULATORY_CARE_PROVIDER_SITE_OTHER): Payer: Medicaid Other | Admitting: Internal Medicine

## 2019-02-18 ENCOUNTER — Other Ambulatory Visit: Payer: Self-pay

## 2019-02-18 DIAGNOSIS — C7951 Secondary malignant neoplasm of bone: Secondary | ICD-10-CM | POA: Diagnosis not present

## 2019-02-18 DIAGNOSIS — C50811 Malignant neoplasm of overlapping sites of right female breast: Secondary | ICD-10-CM | POA: Diagnosis not present

## 2019-02-18 DIAGNOSIS — R7881 Bacteremia: Secondary | ICD-10-CM | POA: Diagnosis not present

## 2019-02-18 DIAGNOSIS — M549 Dorsalgia, unspecified: Secondary | ICD-10-CM | POA: Diagnosis not present

## 2019-02-18 DIAGNOSIS — B9561 Methicillin susceptible Staphylococcus aureus infection as the cause of diseases classified elsewhere: Secondary | ICD-10-CM

## 2019-02-18 DIAGNOSIS — G893 Neoplasm related pain (acute) (chronic): Secondary | ICD-10-CM | POA: Diagnosis not present

## 2019-02-18 NOTE — Progress Notes (Signed)
[id number] follow up/televisit for mssa bacteremia, patient consented to calling on her cell phone to discuss her health, called from the office line. appt televisit due to mobility issues  Patient ID: Suzanne [name], female   DOB: [birth year], 58 y.o.   MRN: [id number]  HPI 58yo f with metastatic breast ca, sacral wounds, found to have MSSA bacteremia not associated with portacath, finished 4 wk of cefazolin from 7/31-8/27.  Readmitted in for pathologic back fracture, not a candidate for vertebroplasty. Fractures thought to be due to metastatic involvement of spine  Getting miralax, colace for constipation from pain meds  Sees  Oncologist for her management of breast ca as well as pathologic fracture  Micro: Blood cx 7/30 +mssa x 4  Blood cx 7/31 NTGD x 4  tte on 7/31: 1. The left ventricle has normal systolic function with an ejection fraction of 60-65%. Left ventricular diastolic parameters were normal. No evidence of left ventricular regional wall motion abnormalities.  2. The right ventricle has normal systolic function. The cavity was mildly enlarged. There is no increase in right ventricular wall thickness. Right ventricular systolic pressure is normal with an estimated pressure of 33.8 mmHg.  3. Trivial pericardial effusion is present.  4. The aortic valve is tricuspid. No stenosis of the aortic valve.  5. The aortic root is normal in size and structure.  Outpatient Encounter Medications as of 02/18/2019  Medication Sig  . albuterol (VENTOLIN HFA) 108 (90 Base) MCG/ACT inhaler Inhale 2 puffs into the lungs every 6 (six) hours as needed for wheezing or shortness of breath.  . ALPRAZolam (XANAX) 0.25 MG tablet Take 1-2 tablets (0.25-0.5 mg total) by mouth every 8 (eight) hours as needed for anxiety. Prescription needed for SNF placement  . anastrozole (ARIMIDEX) 1 MG tablet Take 1 tablet (1 mg total) by mouth daily.  Marland Kitchen antiseptic oral rinse (BIOTENE) LIQD 15 mLs by Mouth Rinse  route as needed for dry mouth.  . benzonatate (TESSALON) 200 MG capsule TAKE 1 CAPSULE BY MOUTH 3 TIMES DAILY IF NEEDED (Patient taking differently: Take 200 mg by mouth 3 (three) times daily as needed for cough. )  . calcium-vitamin D (OSCAL WITH D) 500-200 MG-UNIT tablet Take 1 tablet by mouth 3 (three) times daily.  . carvedilol (COREG) 3.125 MG tablet Take 1 tablet (3.125 mg total) by mouth 2 (two) times daily with a meal.  . furosemide (LASIX) 20 MG tablet Take 2 tablets (40 mg total) by mouth 2 (two) times daily.  Marland Kitchen gabapentin (NEURONTIN) 100 MG capsule Take 1 capsule (100 mg total) by mouth 2 (two) times daily.  Marland Kitchen loratadine (CLARITIN) 10 MG tablet Take 1 tablet (10 mg total) by mouth daily.  Marland Kitchen nystatin (MYCOSTATIN/NYSTOP) powder Apply topically 3 (three) times daily. Apply under breast area  . oxyCODONE (OXY IR/ROXICODONE) 5 MG immediate release tablet Take 1 tablet (5 mg total) by mouth every 4 (four) hours as needed for moderate pain. Prescription needed for SNF placement  . palbociclib (IBRANCE) 100 MG capsule Take 1 capsule (100 mg total) by mouth daily with breakfast. Take whole with food. Take for 21 days on, 7 days off, repeat every 28 days. (Patient taking differently: Take 100 mg by mouth daily with breakfast. Take for 21 days on, 7 days off, repeat every 28 days.)  . polyethylene glycol (MIRALAX / GLYCOLAX) 17 g packet Take 17 g by mouth as needed for constipation.  . senna (SENOKOT) 8.6 MG TABS tablet Take 1 tablet (8.6 mg total)  by mouth 2 (two) times daily.   No facility-administered encounter medications on file as of 02/18/2019.      Patient Active Problem List   Diagnosis Date Noted  . Intractable back pain 02/03/2019  . Thoracic compression fracture, closed, initial encounter ([place]) 02/03/2019  . Essential hypertension 02/03/2019  . Genetic testing 01/27/2019  . Acute pharyngitis   . Bacteremia   . FTT (failure to thrive) in adult   . Fracture closed, humerus, shaft  01/09/2019  . Pneumonia 12/25/2018  . Acute respiratory disorder in immunocompromised patient ([place]) 12/25/2018  . Bone metastases ([place]) 12/24/2018  . Family history of breast cancer   . Family history of prostate cancer   . Family history of melanoma   . Family history of colon cancer   . Pressure injury of skin 11/24/2018  . Chronic respiratory failure with hypoxia and hypercapnia ([place]) 11/19/2018  . 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 Hypophosphatemia 11/19/2018  . Hypocalcemia 11/19/2018  . Aspiration pneumonia ([place]) 11/19/2018  . SOB (shortness of breath)   . Palliative care by specialist   . Carcinoma of breast, estrogen receptor positive, stage 4, right ([place]) 11/16/2018  . Hypoalbuminemia 11/16/2018  . Pleural effusion 11/14/2018  . Goals of care, counseling/discussion 11/06/2018  . Malignant neoplasm of overlapping sites of right breast in female, estrogen receptor positive ([place]) 10/31/2018  . Lung metastasis ([place]) 10/31/2018  . Pain from bone metastases ([place]) 10/31/2018  . Morbid obesity with BMI of 40.0-44.9, adult ([place]) 10/31/2018  . Bone injury   . Pleural effusion, bilateral   . Shortness of breath   . Abnormal breast finding   . Hypokalemia   . Breast skin changes   . Macrocytic anemia   . Bone metastasis ([place])   . Acute anemia 10/26/2018     Health Maintenance Due  Topic Date Due  . Hepatitis C Screening  26-Nov-1960  . TETANUS/TDAP  01/14/1978  . PAP SMEAR-Modifier  01/15/1980  . MAMMOGRAM  01/14/2009  . COLONOSCOPY  01/14/2009  . INFLUENZA VACCINE  12/27/2018     Review of Systems Wound and back pain. Occasional fatigue from decondition and medications. 12 point ros is otherwise negative Physical Exam   No vitals, or physical exam due to televisit CBC Lab Results  Component Value Date   WBC 5.6 02/05/2019   RBC 2.64 (L) 02/05/2019   HGB 8.6 (L) 02/05/2019   HCT 29.0 (L) 02/05/2019   PLT 423 (H) 02/05/2019   MCV 109.8 (H) 02/05/2019   MCH  32.6 02/05/2019   MCHC 29.7 (L) 02/05/2019   RDW 19.4 (H) 02/05/2019   LYMPHSABS 0.9 02/04/2019   MONOABS 0.2 02/04/2019   EOSABS 0.1 02/04/2019    BMET Lab Results  Component Value Date   NA 140 02/05/2019   K 4.3 02/05/2019   CL 96 (L) 02/05/2019   CO2 32 02/05/2019   GLUCOSE 154 (H) 02/05/2019   BUN 23 (H) 02/05/2019   CREATININE 0.91 02/05/2019   CALCIUM 8.4 (L) 02/05/2019   GFRNONAA >60 02/05/2019   GFRAA >60 02/05/2019      Assessment and Plan  Complicated MSSA bacteremia = she has completed 4 week of iv abtx. Not requiring further abtx.   PRN

## 2019-02-19 ENCOUNTER — Inpatient Hospital Stay (HOSPITAL_BASED_OUTPATIENT_CLINIC_OR_DEPARTMENT_OTHER): Payer: Medicaid Other | Admitting: Oncology

## 2019-02-19 ENCOUNTER — Inpatient Hospital Stay: Payer: Medicaid Other

## 2019-02-19 ENCOUNTER — Other Ambulatory Visit: Payer: Self-pay

## 2019-02-19 ENCOUNTER — Telehealth: Payer: Self-pay | Admitting: Pharmacist

## 2019-02-19 ENCOUNTER — Ambulatory Visit: Payer: Medicaid Other | Admitting: Family Medicine

## 2019-02-19 VITALS — BP 93/60 | HR 81 | Temp 98.0°F | Resp 18 | Ht 63.0 in

## 2019-02-19 DIAGNOSIS — Z6841 Body Mass Index (BMI) 40.0 and over, adult: Secondary | ICD-10-CM

## 2019-02-19 DIAGNOSIS — J90 Pleural effusion, not elsewhere classified: Secondary | ICD-10-CM

## 2019-02-19 DIAGNOSIS — I890 Lymphedema, not elsewhere classified: Secondary | ICD-10-CM | POA: Diagnosis not present

## 2019-02-19 DIAGNOSIS — M2550 Pain in unspecified joint: Secondary | ICD-10-CM | POA: Diagnosis not present

## 2019-02-19 DIAGNOSIS — Z808 Family history of malignant neoplasm of other organs or systems: Secondary | ICD-10-CM | POA: Diagnosis not present

## 2019-02-19 DIAGNOSIS — Z170 Estrogen receptor positive status [ER+]: Secondary | ICD-10-CM

## 2019-02-19 DIAGNOSIS — E669 Obesity, unspecified: Secondary | ICD-10-CM | POA: Diagnosis not present

## 2019-02-19 DIAGNOSIS — C7800 Secondary malignant neoplasm of unspecified lung: Secondary | ICD-10-CM

## 2019-02-19 DIAGNOSIS — Z800 Family history of malignant neoplasm of digestive organs: Secondary | ICD-10-CM | POA: Diagnosis not present

## 2019-02-19 DIAGNOSIS — R0902 Hypoxemia: Secondary | ICD-10-CM | POA: Diagnosis not present

## 2019-02-19 DIAGNOSIS — D649 Anemia, unspecified: Secondary | ICD-10-CM | POA: Diagnosis not present

## 2019-02-19 DIAGNOSIS — C7951 Secondary malignant neoplasm of bone: Secondary | ICD-10-CM

## 2019-02-19 DIAGNOSIS — C7801 Secondary malignant neoplasm of right lung: Secondary | ICD-10-CM

## 2019-02-19 DIAGNOSIS — Z803 Family history of malignant neoplasm of breast: Secondary | ICD-10-CM | POA: Diagnosis not present

## 2019-02-19 DIAGNOSIS — Z23 Encounter for immunization: Secondary | ICD-10-CM | POA: Diagnosis not present

## 2019-02-19 DIAGNOSIS — C50911 Malignant neoplasm of unspecified site of right female breast: Secondary | ICD-10-CM | POA: Diagnosis not present

## 2019-02-19 DIAGNOSIS — Z79811 Long term (current) use of aromatase inhibitors: Secondary | ICD-10-CM | POA: Diagnosis not present

## 2019-02-19 DIAGNOSIS — C50811 Malignant neoplasm of overlapping sites of right female breast: Secondary | ICD-10-CM

## 2019-02-19 DIAGNOSIS — R0602 Shortness of breath: Secondary | ICD-10-CM

## 2019-02-19 DIAGNOSIS — I10 Essential (primary) hypertension: Secondary | ICD-10-CM | POA: Diagnosis not present

## 2019-02-19 DIAGNOSIS — J910 Malignant pleural effusion: Secondary | ICD-10-CM | POA: Diagnosis not present

## 2019-02-19 LAB — COMPREHENSIVE METABOLIC PANEL
ALT: <6 U/L (ref 0–44)
AST: 11 U/L — ABNORMAL LOW (ref 15–41)
Albumin: 2.7 g/dL — ABNORMAL LOW (ref 3.5–5.0)
Alkaline Phosphatase: 206 U/L — ABNORMAL HIGH (ref 38–126)
Anion gap: 11 (ref 5–15)
BUN: 13 mg/dL (ref 6–20)
CO2: 37 mmol/L — ABNORMAL HIGH (ref 22–32)
Calcium: 9.5 mg/dL (ref 8.9–10.3)
Chloride: 93 mmol/L — ABNORMAL LOW (ref 98–111)
Creatinine, Ser: 0.73 mg/dL (ref 0.44–1.00)
GFR calc Af Amer: >60 mL/min (ref >60)
GFR calc non Af Amer: >60 mL/min (ref >60)
Glucose, Bld: 141 mg/dL — ABNORMAL HIGH (ref 70–99)
Potassium: 3.3 mmol/L — ABNORMAL LOW (ref 3.5–5.1)
Sodium: 141 mmol/L (ref 135–145)
Total Bilirubin: 0.4 mg/dL (ref 0.3–1.2)
Total Protein: 6.8 g/dL (ref 6.5–8.1)

## 2019-02-19 LAB — CBC WITH DIFFERENTIAL/PLATELET
Abs Immature Granulocytes: 0.01 10*3/uL (ref 0.00–0.07)
Basophils Absolute: 0 10*3/uL (ref 0.0–0.1)
Basophils Relative: 2 %
Eosinophils Absolute: 0 10*3/uL (ref 0.0–0.5)
Eosinophils Relative: 1 %
HCT: 25.4 % — ABNORMAL LOW (ref 36.0–46.0)
Hemoglobin: 7.9 g/dL — ABNORMAL LOW (ref 12.0–15.0)
Immature Granulocytes: 0 %
Lymphocytes Relative: 35 %
Lymphs Abs: 0.9 10*3/uL (ref 0.7–4.0)
MCH: 33.6 pg (ref 26.0–34.0)
MCHC: 31.1 g/dL (ref 30.0–36.0)
MCV: 108.1 fL — ABNORMAL HIGH (ref 80.0–100.0)
Monocytes Absolute: 0.1 10*3/uL (ref 0.1–1.0)
Monocytes Relative: 5 %
Neutro Abs: 1.5 10*3/uL — ABNORMAL LOW (ref 1.7–7.7)
Neutrophils Relative %: 57 %
Platelets: 214 10*3/uL (ref 150–400)
RBC: 2.35 MIL/uL — ABNORMAL LOW (ref 3.87–5.11)
RDW: 20.3 % — ABNORMAL HIGH (ref 11.5–15.5)
WBC: 2.6 10*3/uL — ABNORMAL LOW (ref 4.0–10.5)
nRBC: 0 % (ref 0.0–0.2)

## 2019-02-19 MED ORDER — INFLUENZA VAC SPLIT QUAD 0.5 ML IM SUSY
PREFILLED_SYRINGE | INTRAMUSCULAR | Status: AC
  Filled 2019-02-19: qty 0.5

## 2019-02-19 MED ORDER — DENOSUMAB 120 MG/1.7ML ~~LOC~~ SOLN
120.0000 mg | Freq: Once | SUBCUTANEOUS | Status: AC
  Administered 2019-02-19: 120 mg via SUBCUTANEOUS

## 2019-02-19 MED ORDER — INFLUENZA VAC SPLIT QUAD 0.5 ML IM SUSY
0.5000 mL | PREFILLED_SYRINGE | Freq: Once | INTRAMUSCULAR | Status: AC
  Administered 2019-02-19: 0.5 mL via INTRAMUSCULAR

## 2019-02-19 MED ORDER — DENOSUMAB 120 MG/1.7ML ~~LOC~~ SOLN
SUBCUTANEOUS | Status: AC
  Filled 2019-02-19: qty 1.7

## 2019-02-19 MED ORDER — PALBOCICLIB 100 MG PO CAPS: 100.0000 mg | ORAL_CAPSULE | Freq: Every day | ORAL | 0 refills | Status: DC

## 2019-02-19 NOTE — Progress Notes (Signed)
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 She was brought with [facility] transportation.    She continues on anastrozole with good tolerance.  She does not have problems with hot flashes or vaginal dryness from this.  She is also taking Palbociclib with good tolerance.  Her current dose is 200 mg daily taking for 3 weeks and then off for 1 week and then repeat  She receives Xgeva every 4 weeks and her last dose was on 01/09/2019.  She will receive a dose today  Since her last visit, Suzanne [name]'s daughter called our office on 02/03/2019 to inform Suzanne [name] of her mother's severe back pain. She was told to take her mother to the ED. [name] underwent thoracic and lumbar spine CT while in the ED, which revealed a compression fracture of T12 with 50% loss of height and slight retropulsion. She was seen by neurosurgery and was recommended for admission. Per consultation note with neurosurgery from 02/04/2019, surgery is not recommended at this time. Interventional radiation also saw [name], and they did not feel they can proceed to kyphoplasty given the diffuse bony replacement by neoplastic disease. I personally saw the patient in the hospital to follow her progress.   Since her last visit here she also had repair of her left humeral fracture under Dr. Veverly Fells.  She is still has her left arm in a sling, but she tells me it is healing well per her recent visit with the orthopedist.   REVIEW OF  SYSTEMS: [name] feels that she is making some progress.  She has been able to take a couple of steps out of bed with assistance.  She is looking forward to using a bedside commode instead of a bedpan and she would like to do more rehab in better out of bed at [facility] where she is currently residing.  She has not had any unusual headaches, visual changes, nausea, vomiting, cough, phlegm production, or pleurisy.  She is very constipated likely secondary to her narcotics.  Detailed review of systems today was otherwise stable   HISTORY OF CURRENT ILLNESS: From the original inpatient consult note:  Suzanne [name] is a 58 year old female from [place], [place] without significant past medical history with exception of obesity, remote history of gallstones, and remote history of mastitis.  The patient presented to the hospital on 10/26/2018 with a 3-week history of worsening shortness of breath and lower extremity edema.  The patient reported that her shortness of breath worsened with exertion.  Her lower extremity edema did not improve with elevation.  She also had an intermittent pr oductive cough with rare sputum production.  The patient was noted to be anemic with a hemoglobin of 4.5 with a clumped platelets noted on admission.  Stool for occult blood was positive.  Patient received 2 units of packed red blood cells.    The patient reported a rash under her breast x1 week which has been foul-smelling.  She also reported that she has some discomfort and thickening/hardness of her right breast for several months.  She states that her right breast is much larger than her left.  She notes some occasional bloody discharge from her breast once in a while.  A chest x-ray on admission showed diffuse bilateral interstitial opacity of unknown chronicity, findings could be related to interstitial inflammatory process, atypical/viral pneumonia, or possible metastatic disease.  There are also sclerotic and  lytic lesions within the clavicles, bilateral ribs, shoulders which was concerning for diffuse few skeletal metastatic disease.    This prompted a CT scan of the chest with contrast which showed bilateral axillary and mediastinal adenopathy, borderline hilar lymph nodes bilaterally, findings likely flecked metastatic lymphadenopathy, moderate bilateral pleural effusions, extensive interstitial thickening and reticulonodular opacities throughout the lungs, cannot exclude lymphangitic spread of tumor, diffuse skeletal metastases.  The patient's subsequent history is as detailed below.   PAST MEDICAL HISTORY: Past Medical History:  Diagnosis Date   Cancer [facility])    Family history of breast cancer    Family history of colon cancer    Family history of melanoma    Family history of prostate cancer    Gallstones    Glaucoma    Mastitis    reports history of recurrent mastitis   Metastatic breast cancer ([place])    Obesity     PAST SURGICAL HISTORY: Past Surgical History:  Procedure Laterality Date   GLAUCOMA SURGERY  late 1990's   IR THORACENTESIS ASP PLEURAL SPACE W/IMG GUIDE  10/28/2018   IR THORACENTESIS ASP PLEURAL SPACE W/IMG GUIDE  10/29/2018   OPEN REDUCTION INTERNAL FIXATION (ORIF) DISTAL RADIAL FRACTURE Left 01/09/2019   Procedure: OPEN REDUCTION INTERNAL FIXATION (ORIF) HUMERAL FRACTURE;  Surgeon: Netta Cedars, MD;  Location: [facility];  Service: Orthopedics;  Laterality: Left;    FAMILY HISTORY: Family History  Problem Relation Age of Onset   Breast cancer Sister        in her 24's-70s   Heart disease Brother        CABG   Heart disease Brother        CABG   Skin cancer Brother        melanoma   Colon cancer Cousin    Heart attack Mother    Heart attack Father    Prostate cancer Brother   Patient's father passed away at age 59, and her mother at 82, both from heart attacks. The patient has 8 siblings, 6 brothers and 1 sister.  Her sister was diagnosed  with breast cancer. One brother has prostate cancer, another brother had a melanoma removed.    GYNECOLOGIC HISTORY:  Menarche: 58 years old Age at first live birth: 58 years old [place] P 1 LMP age 48 Contraceptive: used for 2-3 years with no problems HRT never used  Hysterectomy? no   SOCIAL HISTORY:  Ilene works for an Engineer, manufacturing systems. She is widowed. She lives at home with her daughter. Daughter [name], age 18, is a Psychologist, occupational at the science center.  The patient has no grandchildren. She is not a Ambulance person.   ADVANCED DIRECTIVES: not on file; she has the paperwork already. She intends to name her daughter as her HCPOA.   HEALTH MAINTENANCE: Social History   Tobacco Use   Smoking status: Never Smoker   Smokeless tobacco: Never Used   Tobacco comment: second hand smoke exposure  Substance Use Topics   Alcohol use: No   Drug use: No    Colonoscopy:   PAP:   Bone density:  Mammography:   No Known Allergies  Current Outpatient Medications  Medication Sig Dispense Refill   albuterol (VENTOLIN HFA) 108 (90 Base) MCG/ACT inhaler Inhale 2 puffs into the lungs every 6 (six) hours as needed for wheezing or shortness of breath. 16 g 3   ALPRAZolam (XANAX) 0.25 MG tablet Take 1-2 tablets (0.25-0.5 mg total) by mouth every 8 (eight) hours as needed for anxiety. Prescription needed for SNF placement 5 tablet 0   anastrozole (ARIMIDEX) 1 MG tablet Take 1 tablet (1 mg total) by mouth daily. 30 tablet 3   antiseptic oral rinse (BIOTENE) LIQD 15 mLs by Mouth Rinse route as needed for dry mouth.     benzonatate (TESSALON) 200 MG capsule TAKE 1 CAPSULE BY MOUTH 3 TIMES DAILY IF NEEDED (Patient taking differently: Take 200 mg by mouth 3 (three) times daily as needed for cough. ) 30 capsule 0   calcium-vitamin D (OSCAL WITH D) 500-200 MG-UNIT tablet Take 1 tablet by mouth 3 (three) times daily. 90 tablet 1   carvedilol (COREG) 3.125 MG tablet Take 1 tablet (3.125  mg total) by mouth 2 (two) times daily with a meal. 60 tablet 0   furosemide (LASIX) 20 MG tablet Take 2 tablets (40 mg total) by mouth 2 (two) times daily. 120 tablet 4   gabapentin (NEURONTIN) 100 MG capsule Take 1 capsule (100 mg total) by mouth 2 (two) times daily. 60 capsule 0   loratadine (CLARITIN) 10 MG tablet Take 1 tablet (10 mg total) by mouth daily. 30 tablet 3   nystatin (MYCOSTATIN/NYSTOP) powder Apply topically 3 (three) times daily. Apply under breast area 15 g 1   oxyCODONE (OXY IR/ROXICODONE) 5 MG immediate release tablet Take 1 tablet (5 mg total) by mouth every 4 (four) hours as needed for moderate pain. Prescription needed for SNF placement 10 tablet 0   palbociclib (IBRANCE) 100 MG capsule Take 1 capsule (100 mg total) by mouth daily with breakfast. Take whole with food. Take for 21 days on, 7 days off, repeat every 28 days. 21 capsule 0   polyethylene glycol (MIRALAX / GLYCOLAX) 17 g packet Take 17 g by mouth as needed for constipation.     senna (SENOKOT) 8.6 MG TABS tablet Take 1 tablet (8.6 mg total) by mouth 2 (two) times daily. 120 tablet 0   No current facility-administered medications for this visit.    Facility-Administered Medications Ordered in Other Visits  Medication Dose Route Frequency Provider Last Rate Last Dose   denosumab (XGEVA) injection 120 mg  120 mg Subcutaneous Once Moua Rasmusson, Virgie Dad, MD       influenza vac split quadrivalent PF (FLUARIX) injection 0.5 mL  0.5 mL Intramuscular Once Yolani Vo, Virgie Dad, MD         OBJECTIVE: Middle-aged white woman examined in a stretcher  Vitals:   02/19/19 1106  BP: 93/60  Pulse: 81  Resp: 18  Temp: 98 F (36.7 C)  SpO2: 99%     Body mass index is 29.6 kg/m.   Wt Readings from Last 3 Encounters:  02/06/19 167 lb 1.7 oz (75.8 kg)  01/22/19 182 lb 12.2 oz (82.9 kg)  12/01/18 191 lb 1.6 oz (86.7 kg)  ECOG FS:3  Sclerae unicteric, EOMs intact Wearing a mask No cervical or supraclavicular  adenopathy Lungs no rales or rhonchi auscultated anterolaterally Heart regular rate and rhythm Abd soft, nontender, positive bowel sounds MSK left upper extremity in a sling Neuro: nonfocal, well oriented, appropriate affect Breasts: Deferred     LAB RESULTS:  CMP  Component Value Date/Time   NA 141 02/19/2019 1006   K 3.3 (L) 02/19/2019 1006   CL 93 (L) 02/19/2019 1006   CO2 37 (H) 02/19/2019 1006   GLUCOSE 141 (H) 02/19/2019 1006   BUN 13 02/19/2019 1006   CREATININE 0.73 02/19/2019 1006   CREATININE 0.63 12/22/2018 1219   CALCIUM 9.5 02/19/2019 1006   PROT 6.8 02/19/2019 1006   ALBUMIN 2.7 (L) 02/19/2019 1006   AST 11 (L) 02/19/2019 1006   AST 61 (H) 12/22/2018 1219   ALT <6 02/19/2019 1006   ALT 51 (H) 12/22/2018 1219   ALKPHOS 206 (H) 02/19/2019 1006   BILITOT 0.4 02/19/2019 1006   BILITOT 0.5 12/22/2018 1219   GFRNONAA >60 02/19/2019 1006   GFRNONAA >60 12/22/2018 1219   GFRAA >60 02/19/2019 1006   GFRAA >60 12/22/2018 1219    No results found for: TOTALPROTELP, ALBUMINELP, A1GS, A2GS, BETS, BETA2SER, GAMS, MSPIKE, SPEI  No results found for: KPAFRELGTCHN, LAMBDASER, KAPLAMBRATIO  Lab Results  Component Value Date   WBC 2.6 (L) 02/19/2019   NEUTROABS 1.5 (L) 02/19/2019   HGB 7.9 (L) 02/19/2019   HCT 25.4 (L) 02/19/2019   MCV 108.1 (H) 02/19/2019   PLT 214 02/19/2019    '@LASTCHEMISTRY' @  No results found for: LABCA2  No components found for: SWHQPR916  No results for input(s): INR in the last 168 hours.  No results found for: LABCA2  No results found for: BWG665  No results found for: LDJ570  No results found for: VXB939  Lab Results  Component Value Date   CA2729 108.1 (H) 01/29/2019    No components found for: HGQUANT  No results found for: CEA1 / No results found for: CEA1   No results found for: AFPTUMOR  No results found for: [place]  No results found for: PSA1  Appointment on 02/19/2019  Component Date Value Ref  Range Status   WBC 02/19/2019 2.6* 4.0 - 10.5 K/uL Final   RBC 02/19/2019 2.35* 3.87 - 5.11 MIL/uL Final   Hemoglobin 02/19/2019 7.9* 12.0 - 15.0 g/dL Final   HCT 02/19/2019 25.4* 36.0 - 46.0 % Final   MCV 02/19/2019 108.1* 80.0 - 100.0 fL Final   MCH 02/19/2019 33.6  26.0 - 34.0 pg Final   MCHC 02/19/2019 31.1  30.0 - 36.0 g/dL Final   RDW 02/19/2019 20.3* 11.5 - 15.5 % Final   Platelets 02/19/2019 214  150 - 400 K/uL Final   nRBC 02/19/2019 0.0  0.0 - 0.2 % Final   Neutrophils Relative % 02/19/2019 57  % Final   Neutro Abs 02/19/2019 1.5* 1.7 - 7.7 K/uL Final   Lymphocytes Relative 02/19/2019 35  % Final   Lymphs Abs 02/19/2019 0.9  0.7 - 4.0 K/uL Final   Monocytes Relative 02/19/2019 5  % Final   Monocytes Absolute 02/19/2019 0.1  0.1 - 1.0 K/uL Final   Eosinophils Relative 02/19/2019 1  % Final   Eosinophils Absolute 02/19/2019 0.0  0.0 - 0.5 K/uL Final   Basophils Relative 02/19/2019 2  % Final   Basophils Absolute 02/19/2019 0.0  0.0 - 0.1 K/uL Final   Immature Granulocytes 02/19/2019 0  % Final   Abs Immature Granulocytes 02/19/2019 0.01  0.00 - 0.07 K/uL Final   Polychromasia 02/19/2019 PRESENT   Final   Performed at [facility], [place] [address] [place], [place] [address]   Sodium 02/19/2019 141  135 - 145 mmol/L Final   Potassium 02/19/2019 3.3* 3.5 - 5.1 mmol/L  Final   Chloride 02/19/2019 93* 98 - 111 mmol/L Final   CO2 02/19/2019 37* 22 - 32 mmol/L Final   Glucose, Bld 02/19/2019 141* 70 - 99 mg/dL Final   BUN 02/19/2019 13  6 - 20 mg/dL Final   Creatinine, Ser 02/19/2019 0.73  0.44 - 1.00 mg/dL Final   Calcium 02/19/2019 9.5  8.9 - 10.3 mg/dL Final   Total Protein 02/19/2019 6.8  6.5 - 8.1 g/dL Final   Albumin 02/19/2019 2.7* 3.5 - 5.0 g/dL Final   AST 02/19/2019 11* 15 - 41 U/L Final   ALT 02/19/2019 <6  0 - 44 U/L Final   Alkaline Phosphatase 02/19/2019 206* 38 - 126 U/L Final   Total Bilirubin  02/19/2019 0.4  0.3 - 1.2 mg/dL Final   GFR calc non Af Amer 02/19/2019 >60  >60 mL/min Final   GFR calc Af Amer 02/19/2019 >60  >60 mL/min Final   Anion gap 02/19/2019 11  5 - 15 Final   Performed at [facility], [place] [address], [place], [place] [address]    (this displays the last labs from the last 3 days)  No results found for: TOTALPROTELP, ALBUMINELP, A1GS, A2GS, BETS, BETA2SER, GAMS, MSPIKE, SPEI (this displays SPEP labs)  No results found for: KPAFRELGTCHN, LAMBDASER, KAPLAMBRATIO (kappa/lambda light chains)  No results found for: HGBA, HGBA2QUANT, HGBFQUANT, HGBSQUAN (Hemoglobinopathy evaluation)   Lab Results  Component Value Date   LDH 244 (H) 12/25/2018    Lab Results  Component Value Date   IRON 137 10/26/2018   TIBC 384 10/26/2018   IRONPCTSAT 36 (H) 10/26/2018   (Iron and TIBC)  Lab Results  Component Value Date   FERRITIN 2,306 (H) 12/25/2018    Urinalysis    Component Value Date/Time   COLORURINE STRAW (A) 01/15/2019 1150   APPEARANCEUR CLEAR 01/15/2019 1150   LABSPEC 1.008 01/15/2019 1150   PHURINE 6.0 01/15/2019 1150   GLUCOSEU NEGATIVE 01/15/2019 [place] 01/15/2019 [place] 01/15/2019 1150   KETONESUR NEGATIVE 01/15/2019 1150   PROTEINUR NEGATIVE 01/15/2019 1150   NITRITE NEGATIVE 01/15/2019 1150   LEUKOCYTESUR TRACE (A) 01/15/2019 1150     STUDIES:  Dg Chest 1 View  Result Date: 01/20/2019 CLINICAL DATA:  Status post right thoracentesis. History of metastatic breast cancer EXAM: CHEST  1 VIEW COMPARISON:  01/17/2019 FINDINGS: Stable cardiomediastinal contours. Decrease right-sided pleural effusion. Lung volumes are low. No appreciable pneumothorax. A right-sided PICC line is present. Surgical fixation hardware in the proximal left humerus. Extensive osseous metastatic disease throughout the visualized thorax. IMPRESSION: Interval decrease in a right-sided pleural effusion. No  pneumothorax identified. Electronically Signed   By: Davina Poke M.D.   On: 01/20/2019 15:54   Dg Lumbar Spine Complete  Result Date: 02/04/2019 CLINICAL DATA:  Back pain EXAM: LUMBAR SPINE - COMPLETE 4+ VIEW COMPARISON:  CT 02/03/2019 FINDINGS: Diffuse sclerosis and lytic process, consistent with skeletal metastatic disease. Acute kyphosis centered at T11-T12 with moderate compression deformities at T11 and T12, loss of the intervertebral disc space. Remaining lumbar vertebra demonstrate normal stature. Degenerative changes at L1-L2. Aortic atherosclerosis. Right L1 transverse process fracture is better seen on the recent CT. IMPRESSION: Extensive, diffuse skeletal metastatic disease. Focal kyphosis centered at T11-T12 with compression deformities at T11 and T12. Electronically Signed   By: Donavan Foil M.D.   On: 02/04/2019 19:31   Ct Thoracic Spine Wo Contrast  Result Date: 02/03/2019 CLINICAL DATA:  Mid back pain/T-spine pain,  initial exam. EXAM: CT THORACIC SPINE WITHOUT CONTRAST TECHNIQUE: Multidetector CT images of the thoracic were obtained using the standard protocol without intravenous contrast. COMPARISON:  CT angiography chest 12/25/2018 FINDINGS: Alignment: Focal kyphosis centered at the T11-T12 level. Trace T12 bony retropulsion. Vertebrae: Redemonstrated marked diffuse osseous heterogeneity with multifocal sclerosis and lytic changes. Findings are consistent with diffuse osseous metastatic disease. New from prior chest CT 12/25/2018, there is a T12 anterior wedge compression deformity with approximately 50% loss of vertebral body height and trace bony retropulsion. Mild loss of T11 vertebral body height is also questioned, and this was not definitively present on prior examination. Widening of the T10-T11 and T11-T12 interspinous spaces appear similar to prior exam. Trace T12 bony retropulsion and T11-T12 gibbus deformity results in mild to moderate bony narrowing of the spinal canal at the  T11-T12 level. At the remaining levels, no high-grade bony spinal canal stenosis or neural foraminal narrowing. Vertebral body height is otherwise maintained. No other definite acute fracture to the thoracic spine is identified. Redemonstrated are multiple bilateral subacute pathologic rib fractures. Paraspinal and other soft tissues: Soft tissue prominence surrounding the T12 vertebra, which may reflect extra ertebral extension of metastatic disease. Small bilateral pleural effusion are similar to prior examination. Redemonstrated bilateral bandlike opacities which may reflect scar or fibrosis. Hiatal hernia containing what appears to be enteric contrast. Cholecystolithiasis Disc levels: Multilevel loss of intervertebral disc height. Thoracic spondylosis. Apart from mild to moderate narrowing at the T11-T12 level related to focal gibbus deformity and trace T12 bony retropulsion, no high-grade bony spinal canal stenosis or neural foraminal narrowing at the remaining levels. IMPRESSION: Redemonstrated diffuse osseous metastatic disease. New from prior chest CT 12/25/2018, there is an acute/subacute T12 compression deformity with approximately 50% loss of vertebral body height and trace bony retropulsion. Mild loss of T11 vertebral body height is also questioned, and new from prior examination. Resultant focal kyphosis at the T11-T12 level with mild to moderate spinal canal narrowing. Widening of the T10-T11 and T12-L1 interspinous spaces appears similar to prior examination, but MRI may be obtained for further evaluation to definitively exclude ligamentous injury. Soft tissue prominence surrounding the T12 vertebra which may reflect extra vertebral extension of metastatic disease. Consider contrast-enhanced MRI for further evaluation, as clinically warranted. No other definite acute fracture to the thoracic spine. Redemonstrated are multiple nonacute bilateral pathologic rib fractures. Small bilateral pleural  effusions. Hiatal hernia Cholecystolithiasis. Electronically Signed   By: Kellie Simmering   On: 02/03/2019 18:13   Ct Lumbar Spine Wo Contrast  Result Date: 02/03/2019 CLINICAL DATA:  Back pain, osteoporosis and chronic steroid use. EXAM: CT LUMBAR SPINE WITHOUT CONTRAST TECHNIQUE: Multidetector CT imaging of the lumbar spine was performed without intravenous contrast administration. Multiplanar CT image reconstructions were also generated. COMPARISON:  CT angiography chest 12/25/2018, FINDINGS: Segmentation: For the purposes of this examination, 5 lumbar vertebrae are assumed and the caudal most well-formed intervertebral disc is designated L5-S1. Alignment: Trace L1-L2 and L2-L3 grade 1 retrolisthesis. Vertebrae: There is marked diffuse osseous heterogeneity with multifocal sclerotic and lytic changes. Findings are consistent with diffuse osseous metastatic disease. No lumbar compression deformity. Subacute appearing fracture of the right L1 transverse process. No other definite fracture of the lumbar spine is identified. Subacute appearing fracture of the posterior left twelfth rib. Paraspinal and other soft tissues: The imaged paraspinal soft tissues are unremarkable. Aortoiliac atherosclerotic disease. Disc levels: Moderate L1-L2 disc degeneration. Intervertebral disc height is otherwise preserved within the lumbar spine. Lumbar spondylosis without high-grade  bony spinal canal or neural foraminal stenosis. IMPRESSION: Findings consistent with diffuse osseous metastatic disease. No lumbar compression deformity. Subacute appearing fracture of the right L1 transverse process. No other definite fracture of the lumbar spine is identified. Subacute appearing fracture of the posterior left twelfth rib. Electronically Signed   By: Kellie Simmering   On: 02/03/2019 17:48   Suzanne [name] Thoracentesis Asp Pleural Space W/img Guide  Result Date: 01/20/2019 INDICATION: Patient with history of metastatic breast cancer, recurrent  malignant right pleural effusion. Request to IR for diagnostic and therapeutic right sided thoracentesis today. EXAM: ULTRASOUND GUIDED RIGHT THORACENTESIS MEDICATIONS: 10 mL 1% lidocaine COMPLICATIONS: None immediate. PROCEDURE: An ultrasound guided thoracentesis was thoroughly discussed with the patient and questions answered. The benefits, risks, alternatives and complications were also discussed. The patient understands and wishes to proceed with the procedure. Written consent was obtained. Ultrasound was performed to localize and mark an adequate pocket of fluid in the right chest. The area was then prepped and draped in the normal sterile fashion. 1% Lidocaine was used for local anesthesia. Under ultrasound guidance a 6 Fr Safe-T-Centesis catheter was introduced. No pleural fluid was able to be aspirated. Patient with significant pain in her back and left shoulder which was present prior to the procedure and was unchanged during the procedure - she is unable to lay on her left side due to recent open reduction internal fixation of the left humerus. Due to significant pain patient requested that procedure be aborted. The catheter was removed and a dressing applied. FINDINGS: No pleural fluid obtained. IMPRESSION: Unsuccessful ultrasound guided right thoracentesis yielding no of pleural fluid. Read by Candiss Norse, PA-C Electronically Signed   By: Jacqulynn Cadet M.D.   On: 01/20/2019 15:54      ELIGIBLE FOR AVAILABLE RESEARCH PROTOCOL: no   ASSESSMENT: 58 y.o. [place], [place] woman presenting 10/26/2018 with right-sided inflammatory breast cancer, stage IV, involvnh lungs, lymph nodes and bones, as follows:  (a) chest CT scan 10/26/2018 shows bilateral pleural effusions, possible lymphangitic spread of tumor, diffuse bony metastatic disease, and significant axillary mediastinal and hilar adenopathy  (b) bone scan 10/27/2018 is a "near [place]" consistent with widespread bony  metastatic disease  (c) head CT with and without contrast 10/30/2018 shows no intracranial metastatic disease, multiple calvarial lesions  (d) CA-27-29 on 10/27/2018 was 1810.4  (1) pleural fluid from right thoracentesis 10/28/2018 confirms malignant cells consistent with a breast primary, strongly estrogen and progesterone receptor positive, HER-2 not amplified, with an MIB-1 of 2%  (2) anastrozole started 10/29/2018  (a) palbociclib to start 11/13/2018 at 100 mg dose  (b) denosumab/xgeva started 11/13/2018   (3) associated problems:  (a) hypoxia secondary to effusions  (b) pain from bone lesions and left humeral and vertebral compression fractures  (c) right upper extremity lymphedema  (d) anemia, requiring transfusion  (e) poor venous access  (4) genetics testing on 12/25/2018 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 STK11, TP53, TSC1, TSC2, and VHL.  The following genes were evaluated for sequence changes only: SDHA and HOXB13 c.251G>A variant only. The [facility]  Melanoma Panel analyzed the following 9 genes: BAP1 BRCA2 CDK4 CDKN2A MITF POT1 PTEN RB1 Tp53.     PLAN: Kemi continues to make slow but steady progress.  Hopefully within the next few weeks she will be able to get out of bed to bedside commode, transfer from bed to chair and chair to bathroom, and all that of course would allow her to get home safely.  Today she will receive her flu shot and her denosumab/Xgeva shot.   She continues on anastrozole.  She is currently on the off week for palbociclib but she will resume palbociclib a week from now and we will make sure that this is received at [facility] from the provider.  I think her constipation is going to be due to her narcotics.  We are stopping those.  She will use Aleve or Advil with Tylenol for pain control.  If that is not sufficient she will let Suzanne [name] know  She will return here in 4 weeks for her next set of labs and visit.  She knows to call for any other issue that may develop before the next appointment.   Chauncey Cruel, MD Medical Oncology and Hematology [facility] [place], [place] [address] Tel. [phone]    Fax. [phone]   I, Wilburn Mylar, am acting as scribe for Dr. Virgie Dad. Radley Teston.  I, Lurline Del MD, have reviewed the above documentation for accuracy and completeness, and I agree with the above.

## 2019-02-19 NOTE — Telephone Encounter (Signed)
Oral Oncology Patient Advocate Encounter  I called [facility] facility and spoke with Darden Dates, the patients nurse. Darden Dates stated that the Ibrance could be mailed to her attention at [address] [place].  I called [facility] who then transferred me to [place] to do an address change. [employer] took this information from me and then stated that it would have to be approved with management because they do not typically help patients that are in skilled nursing facilities because the facility has medicine on hand. I did let them know that the nursing facility would likely not have Ibrance on hand.  [employer] will be calling me back with more information today or tomorrow.  [place] Patient [place] Phone [phone] Fax [phone] 02/19/2019   12:17 PM

## 2019-02-19 NOTE — Progress Notes (Signed)
Pt does not have a port or Picc line anymore. Labs were drawn from arm

## 2019-02-19 NOTE — Telephone Encounter (Signed)
Oral Oncology Pharmacist Encounter  Received call from Dr. Jana Hakim with information that patient is now residing at [facility] facility. MD needs assistance coordinating shipment of [place] from [place] (patient is on [place] patient assistance for her [name]) to facility.  We will contact facility for their preference on this procedure and then contact [facility] pharmacy to update on shipping address.  MD states patient is currently on week "off" of Ibrance. We will update patient's daughter with information discovered.  Johny Drilling, PharmD, BCPS, BCOP  02/19/2019 11:53 AM Oral Oncology Clinic [phone]

## 2019-02-20 ENCOUNTER — Telehealth: Payer: Self-pay | Admitting: Oncology

## 2019-02-20 NOTE — Telephone Encounter (Signed)
I left a message regarding schedule  

## 2019-02-20 NOTE — Addendum Note (Signed)
Addended by: Juliane Poot on: 02/20/2019 04:01 PM   Modules accepted: Orders

## 2019-02-23 NOTE — Telephone Encounter (Signed)
Oral Oncology Patient Advocate Encounter  I called [employer] to follow up on the decision from management and they do not have an update for me yet.  [facility] did tell me that it was ok for [name], the patients daughter to drop the medicine off at the nursing facility.  I called [name] and gave her this information. She will call [facility] today and get them to mail the [name] to her home and take it to [place] when she receives it. [name] said that her mom was only going to be at [facility] for 30 days so she may not need the [place] sent there at all. I asked her to keep me updated on location for the Ibrance to go and I would keep her updated on what [place] is telling me.  [name] verbalized understanding and great appreciation.   [place] Patient [place] Phone [phone] Fax [phone] 02/23/2019   9:58 AM

## 2019-02-24 DIAGNOSIS — C7951 Secondary malignant neoplasm of bone: Secondary | ICD-10-CM | POA: Diagnosis not present

## 2019-02-24 DIAGNOSIS — G893 Neoplasm related pain (acute) (chronic): Secondary | ICD-10-CM | POA: Diagnosis not present

## 2019-02-24 DIAGNOSIS — M549 Dorsalgia, unspecified: Secondary | ICD-10-CM | POA: Diagnosis not present

## 2019-02-24 DIAGNOSIS — C50811 Malignant neoplasm of overlapping sites of right female breast: Secondary | ICD-10-CM | POA: Diagnosis not present

## 2019-02-25 NOTE — Telephone Encounter (Signed)
Oral Oncology Patient Advocate Encounter  [place] from [employer] called to inform me that [place] has approved the patient to still receive the Ibrance through the patient assistance program while she is at [facility].  The [name] has not been scheduled to ship to the patients home yet so [facility] will ship today for delivery tomorrow, 10/1 at [facility]  I called [name] and gave her this information, she verbalized understanding and great appreciation.  [place] Patient [place] Phone [phone] Fax [phone] 02/25/2019   12:35 PM

## 2019-02-25 NOTE — Telephone Encounter (Signed)
Thank you. GM 

## 2019-02-25 NOTE — Telephone Encounter (Signed)
Oral Oncology Patient Advocate Encounter  [place] called back with additional questions and stated that we should hear back from management today.  Oral Oncology clinic will continue to follow.  [place] Patient [place] Phone [phone] Fax [phone] 02/25/2019   8:43 AM

## 2019-02-26 DIAGNOSIS — I10 Essential (primary) hypertension: Secondary | ICD-10-CM | POA: Diagnosis not present

## 2019-03-03 DIAGNOSIS — B372 Candidiasis of skin and nail: Secondary | ICD-10-CM | POA: Diagnosis not present

## 2019-03-03 DIAGNOSIS — R21 Rash and other nonspecific skin eruption: Secondary | ICD-10-CM | POA: Diagnosis not present

## 2019-03-03 DIAGNOSIS — L299 Pruritus, unspecified: Secondary | ICD-10-CM | POA: Diagnosis not present

## 2019-03-03 DIAGNOSIS — C50811 Malignant neoplasm of overlapping sites of right female breast: Secondary | ICD-10-CM | POA: Diagnosis not present

## 2019-03-06 DIAGNOSIS — C50811 Malignant neoplasm of overlapping sites of right female breast: Secondary | ICD-10-CM | POA: Diagnosis not present

## 2019-03-06 DIAGNOSIS — R21 Rash and other nonspecific skin eruption: Secondary | ICD-10-CM | POA: Diagnosis not present

## 2019-03-06 DIAGNOSIS — B372 Candidiasis of skin and nail: Secondary | ICD-10-CM | POA: Diagnosis not present

## 2019-03-11 ENCOUNTER — Telehealth: Payer: Self-pay | Admitting: *Deleted

## 2019-03-11 NOTE — Telephone Encounter (Signed)
This RN spoke with [name] today- to let her know this RN obtained MAR from [facility] with documentation showing pt received Ibrance 100 mg daily starting 10/2 thru 10/11.  [name] states concerns due to pt only had 1 blister pack of 7 tabs used from box of 3 packs ( 21 day supply).  This RN stated pt may have had left over from prior pack they used- and even if pt missed 3 doses it is best to stop on expected end date of 10/22.  [name] also asked about how to use the ondansetron due to 2 different instructions - one states " for nausea and vomiting" and the other says " every 8 hours ".  [name] has not given mom any due " she hasn't been nauseated except for gassy feeling which I gave her tums and that helped a lot and she sat up straighter too "  This RN advised the ondansetron is every 8 hours as needed for N/V.  Lastly [name] states they are awaiting a call from [facility] to have a home health nurse visit- once they come then [facility] PT and OT can resume home visits as well.  This RN recommended for [name] to try to call them tomorrow but also this office can follow up as well.  [name] states she is also unsure when or if they need to go back to radiation- pt was referred and seen by Dr Lanell Persons but then had fracture of her left arm ( pathological ) and then was referred to Ortho.  Ortho was delayed due to pt being at [facility]. Patient is now scheduled for Ortho appointment 10/20.  [place] is scheduled for follow up in this office on 10/22.  Questions answered as well as this RN informed her follow up with [facility] will be done for nurse to visit for assessment and care especially with new medication changes.  No further needs at this time.

## 2019-03-11 NOTE — Telephone Encounter (Signed)
This RN spoke with pt's daughterAnderson [name]- per her call stating pt was d/ced from SNF on Monday- " and the package of [name] that she was sent home with " has more pills then what she should have if she started it on the 2nd (of October)."  Suzanne [name] is asking how she needs to continue dosing " finish out the package or just do until her expected end date of 10/22?"  This RN informed pt to continue until expected end date of 10/22 at this time.  This RN will contact nursing home to verify per [facility] dosing of the Ibrance.  This RN contacted [facility] and was informed inquiry would need to go to the " nursing supervisor " - transferred and obtained VM.  Detailed message left requesting above information - this RN's name and direct desk number given for return call.

## 2019-03-18 ENCOUNTER — Telehealth: Payer: Self-pay

## 2019-03-18 ENCOUNTER — Other Ambulatory Visit: Payer: Self-pay

## 2019-03-18 DIAGNOSIS — C7800 Secondary malignant neoplasm of unspecified lung: Secondary | ICD-10-CM

## 2019-03-18 DIAGNOSIS — J910 Malignant pleural effusion: Secondary | ICD-10-CM

## 2019-03-18 DIAGNOSIS — C7951 Secondary malignant neoplasm of bone: Secondary | ICD-10-CM

## 2019-03-18 DIAGNOSIS — J9611 Chronic respiratory failure with hypoxia: Secondary | ICD-10-CM

## 2019-03-18 DIAGNOSIS — J9612 Chronic respiratory failure with hypercapnia: Secondary | ICD-10-CM

## 2019-03-18 DIAGNOSIS — J90 Pleural effusion, not elsewhere classified: Secondary | ICD-10-CM

## 2019-03-18 NOTE — Telephone Encounter (Signed)
RN spent approximately 45 minutes contacting [facility] and [place] to clarify who attempted contact with patient regard PT.    Pt's daughter reports someone contacted her to set up PT but daughter can not recall who or what company.   RN spoke with Barnetta Chapel at [facility].  Recommendations to fax over referral for PT and they will initiate home PT.  RN successfully faxed over referral to [phone].   RN notified patient's daughter, Suzanne [name], notified.  Voiced appreciation, understands to call if she has not heard anything from [facility] within the next few business days.

## 2019-03-19 ENCOUNTER — Inpatient Hospital Stay: Payer: Medicaid Other | Admitting: Adult Health

## 2019-03-19 ENCOUNTER — Inpatient Hospital Stay: Payer: Medicaid Other | Attending: Adult Health | Admitting: Nutrition

## 2019-03-19 ENCOUNTER — Telehealth: Payer: Self-pay

## 2019-03-19 ENCOUNTER — Telehealth: Payer: Self-pay | Admitting: Oncology

## 2019-03-19 ENCOUNTER — Inpatient Hospital Stay: Payer: Medicaid Other

## 2019-03-19 NOTE — Telephone Encounter (Signed)
Spoke with pt's dtr, [name], by phone regarding missed appointment today.  [name] states she called "early this morning" and cancelled appt and that no one has called her back to get appt rescheduled. High priority msg will be sent to schedulers by this RN.

## 2019-03-19 NOTE — Telephone Encounter (Signed)
Returned patient's phone call regarding rescheduling missed 10/22 appointment, spoke with patient's daughter and appointment has been moved to 11/04.

## 2019-03-25 ENCOUNTER — Encounter: Payer: Self-pay | Admitting: General Practice

## 2019-03-25 NOTE — Progress Notes (Signed)
[facility] CSW Progress Notes  Call from daughter, Nykiah Rothermel.  Concerned that PT referral placed for [facility] PT by [place] has not been followed up on at home.  She is also concerned that evaluation for [facility] has not been scheduled either.  Daughter has also talked with [facility] CSW Juliann Pulse) who is following up on both these needs.  Daughter given number to contact [employer] [facility] [place] if she also desires to contact [facility] agency directly.  Per daughter, desk RN is aware of these issues.  [name], LCSW Clinical Social Worker Phone:  [phone]

## 2019-03-30 DIAGNOSIS — M47816 Spondylosis without myelopathy or radiculopathy, lumbar region: Secondary | ICD-10-CM | POA: Diagnosis not present

## 2019-03-30 DIAGNOSIS — C7951 Secondary malignant neoplasm of bone: Secondary | ICD-10-CM | POA: Diagnosis not present

## 2019-03-30 DIAGNOSIS — M810 Age-related osteoporosis without current pathological fracture: Secondary | ICD-10-CM | POA: Diagnosis not present

## 2019-03-30 DIAGNOSIS — M8458XD Pathological fracture in neoplastic disease, other specified site, subsequent encounter for fracture with routine healing: Secondary | ICD-10-CM | POA: Diagnosis not present

## 2019-03-30 DIAGNOSIS — D630 Anemia in neoplastic disease: Secondary | ICD-10-CM | POA: Diagnosis not present

## 2019-03-30 DIAGNOSIS — G893 Neoplasm related pain (acute) (chronic): Secondary | ICD-10-CM | POA: Diagnosis not present

## 2019-03-30 DIAGNOSIS — E669 Obesity, unspecified: Secondary | ICD-10-CM | POA: Diagnosis not present

## 2019-03-30 DIAGNOSIS — I10 Essential (primary) hypertension: Secondary | ICD-10-CM | POA: Diagnosis not present

## 2019-03-30 DIAGNOSIS — M84522D Pathological fracture in neoplastic disease, left humerus, subsequent encounter for fracture with routine healing: Secondary | ICD-10-CM | POA: Diagnosis not present

## 2019-03-30 DIAGNOSIS — J9611 Chronic respiratory failure with hypoxia: Secondary | ICD-10-CM | POA: Diagnosis not present

## 2019-03-30 DIAGNOSIS — Z170 Estrogen receptor positive status [ER+]: Secondary | ICD-10-CM | POA: Diagnosis not present

## 2019-03-30 DIAGNOSIS — C50811 Malignant neoplasm of overlapping sites of right female breast: Secondary | ICD-10-CM | POA: Diagnosis not present

## 2019-03-30 DIAGNOSIS — Z9181 History of falling: Secondary | ICD-10-CM | POA: Diagnosis not present

## 2019-03-30 DIAGNOSIS — K5900 Constipation, unspecified: Secondary | ICD-10-CM | POA: Diagnosis not present

## 2019-03-30 DIAGNOSIS — C7801 Secondary malignant neoplasm of right lung: Secondary | ICD-10-CM | POA: Diagnosis not present

## 2019-04-01 ENCOUNTER — Telehealth: Payer: Self-pay | Admitting: *Deleted

## 2019-04-01 ENCOUNTER — Inpatient Hospital Stay: Payer: Medicaid Other | Attending: Adult Health

## 2019-04-01 ENCOUNTER — Inpatient Hospital Stay: Payer: Medicaid Other | Admitting: Adult Health

## 2019-04-01 ENCOUNTER — Telehealth: Payer: Self-pay | Admitting: Adult Health

## 2019-04-01 ENCOUNTER — Encounter: Payer: Self-pay | Admitting: General Practice

## 2019-04-01 NOTE — Progress Notes (Signed)
[place] CSW progress Notes  Call from daughter Suzanne [name] on 11/3.  CSW [place] responded and spoke w daughter on that date.  Per daughter, patient cannot ride in wheelchair transport due to severe breathing and pain issues.  Has spoken w Nurse Line early this AM and also left VM on main [facility] line requesting call back to determine next steps for mother's care.  CSW spoke w Suzanne [name] and relayed request to call family.  Suzanne [name], per EMS service, Medicaid in [place] can approve nonemergent EMS transport if needed.  CSW will attempt to investigate options and communicate w family.  Suzanne [place], LCSW Clinical Social Worker Phone:  [phone] Cell:  [phone]

## 2019-04-01 NOTE — Telephone Encounter (Signed)
Ms Suzanne [name] called to let us know she has not had any more falls. Is taking zofran for nausea.

## 2019-04-01 NOTE — Progress Notes (Deleted)
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 She was brought with [facility] transportation.    She continues on anastrozole with good tolerance.  She does not have problems with hot flashes or vaginal dryness from this.  She is also taking Palbociclib with good tolerance.    She receives Xgeva every 4 weeks and her last dose was on  REVIEW OF SYSTEMS: Suzanne [name]   HISTORY OF CURRENT ILLNESS: From the original inpatient consult note:  [name] is a 58 year old female from [place], [place] without significant past medical history with exception of obesity, remote history of gallstones, and remote history of mastitis.  The patient presented to the hospital on 10/26/2018 with a 3-week history of worsening shortness of breath and lower extremity edema.  The patient reported that her shortness of breath worsened with exertion.  Her lower extremity edema did not improve with elevation.  She also had an intermittent pr oductive cough with rare sputum production.  The patient was noted to be anemic with a hemoglobin of 4.5 with a clumped platelets noted on admission.  Stool for occult blood was positive.  Patient received 2 units of packed red blood cells.    The patient reported a rash under her breast x1 week which has been foul-smelling.  She also reported that she has some discomfort and thickening/hardness of her right breast for several months.  She states that her right  breast is much larger than her left.  She notes some occasional bloody discharge from her breast once in a while.  A chest x-ray on admission showed diffuse bilateral interstitial opacity of unknown chronicity, findings could be related to interstitial inflammatory process, atypical/viral pneumonia, or possible metastatic disease.  There are also sclerotic and lytic lesions within the clavicles, bilateral ribs, shoulders which was concerning for diffuse few skeletal metastatic disease.    This prompted a CT scan of the chest with contrast which showed bilateral axillary and mediastinal adenopathy, borderline hilar lymph nodes bilaterally, findings likely flecked metastatic lymphadenopathy, moderate bilateral pleural effusions, extensive interstitial thickening and reticulonodular opacities throughout the lungs, cannot exclude lymphangitic spread of tumor, diffuse skeletal metastases.  The patient's subsequent history is as detailed below.   PAST MEDICAL HISTORY: Past Medical History:  Diagnosis Date  . Cancer ([place])   . Family history of breast cancer   . Family history of colon cancer   . Family history of melanoma   . Family history of prostate cancer   . Gallstones   . Glaucoma   . Mastitis    reports history of recurrent mastitis  . Metastatic breast cancer ([place])   . Obesity     PAST SURGICAL HISTORY: Past Surgical History:  Procedure Laterality Date  . GLAUCOMA SURGERY  late 1990's  . IR THORACENTESIS ASP PLEURAL SPACE W/IMG GUIDE  10/28/2018  . IR THORACENTESIS ASP PLEURAL SPACE W/IMG GUIDE  10/29/2018  . OPEN REDUCTION INTERNAL FIXATION (ORIF) DISTAL RADIAL FRACTURE Left 01/09/2019   Procedure: OPEN REDUCTION INTERNAL FIXATION (ORIF) HUMERAL FRACTURE;  Surgeon: Netta Cedars,  MD;  Location: [facility];  Service: Orthopedics;  Laterality: Left;    FAMILY HISTORY: Family History  Problem Relation Age of Onset  . Breast cancer Sister        in her 21's-70s  . Heart disease Brother         CABG  . Heart disease Brother        CABG  . Skin cancer Brother        melanoma  . Colon cancer Cousin   . Heart attack Mother   . Heart attack Father   . Prostate cancer Brother   Patient's father passed away at age 39, and her mother at 70, both from heart attacks. The patient has 8 siblings, 6 brothers and 1 sister.  Her sister was diagnosed with breast cancer. One brother has prostate cancer, another brother had a melanoma removed.    GYNECOLOGIC HISTORY:  Menarche: 58 years old Age at first live birth: 66 years old Sister [place] P 1 LMP age 55 Contraceptive: used for 2-3 years with no problems HRT never used  Hysterectomy? no   SOCIAL HISTORY:  Aleida works for an Engineer, manufacturing systems. She is widowed. She lives at home with her daughter. Daughter [name], age 38, is a Psychologist, occupational at the science center.  The patient has no grandchildren. She is not a Ambulance person.   ADVANCED DIRECTIVES: not on file; she has the paperwork already. She intends to name her daughter as her HCPOA.   HEALTH MAINTENANCE: Social History   Tobacco Use  . Smoking status: Never Smoker  . Smokeless tobacco: Never Used  . Tobacco comment: second hand smoke exposure  Substance Use Topics  . Alcohol use: No  . Drug use: No    Colonoscopy:   PAP:   Bone density:  Mammography:   No Known Allergies  Current Outpatient Medications  Medication Sig Dispense Refill  . albuterol (VENTOLIN HFA) 108 (90 Base) MCG/ACT inhaler Inhale 2 puffs into the lungs every 6 (six) hours as needed for wheezing or shortness of breath. 16 g 3  . ALPRAZolam (XANAX) 0.25 MG tablet Take 1-2 tablets (0.25-0.5 mg total) by mouth every 8 (eight) hours as needed for anxiety. Prescription needed for SNF placement 5 tablet 0  . anastrozole (ARIMIDEX) 1 MG tablet Take 1 tablet (1 mg total) by mouth daily. 30 tablet 3  . antiseptic oral rinse (BIOTENE) LIQD 15 mLs by Mouth Rinse route as needed for dry mouth.    . benzonatate  (TESSALON) 200 MG capsule TAKE 1 CAPSULE BY MOUTH 3 TIMES DAILY IF NEEDED (Patient taking differently: Take 200 mg by mouth 3 (three) times daily as needed for cough. ) 30 capsule 0  . calcium-vitamin D (OSCAL WITH D) 500-200 MG-UNIT tablet Take 1 tablet by mouth 3 (three) times daily. 90 tablet 1  . carvedilol (COREG) 3.125 MG tablet Take 1 tablet (3.125 mg total) by mouth 2 (two) times daily with a meal. 60 tablet 0  . furosemide (LASIX) 20 MG tablet Take 2 tablets (40 mg total) by mouth 2 (two) times daily. 120 tablet 4  . gabapentin (NEURONTIN) 100 MG capsule Take 1 capsule (100 mg total) by mouth 2 (two) times daily. 60 capsule 0  . loratadine (CLARITIN) 10 MG tablet Take 1 tablet (10 mg total) by mouth daily. 30 tablet 3  . nystatin (MYCOSTATIN/NYSTOP) powder Apply topically 3 (three) times daily. Apply under breast area 15 g 1  . palbociclib (IBRANCE) 100 MG capsule Take 1  capsule (100 mg total) by mouth daily with breakfast. Take whole with food. Take for 21 days on, 7 days off, repeat every 28 days. 21 capsule 0  . polyethylene glycol (MIRALAX / GLYCOLAX) 17 g packet Take 17 g by mouth as needed for constipation.    . senna (SENOKOT) 8.6 MG TABS tablet Take 1 tablet (8.6 mg total) by mouth 2 (two) times daily. 120 tablet 0   No current facility-administered medications for this visit.      OBJECTIVE: Middle-aged white woman examined in a stretcher  There were no vitals filed for this visit.   There is no height or weight on file to calculate BMI.   Wt Readings from Last 3 Encounters:  02/06/19 167 lb 1.7 oz (75.8 kg)  01/22/19 182 lb 12.2 oz (82.9 kg)  12/01/18 191 lb 1.6 oz (86.7 kg)  ECOG FS:3 GENERAL: Patient is a well appearing female in no acute distress HEENT:  Sclerae anicteric.  Oropharynx clear and moist. No ulcerations or evidence of oropharyngeal candidiasis. Neck is supple.  NODES:  No cervical, supraclavicular, or axillary lymphadenopathy palpated.  BREAST EXAM:   Deferred. LUNGS:  Clear to auscultation bilaterally.  No wheezes or rhonchi. HEART:  Regular rate and rhythm. No murmur appreciated. ABDOMEN:  Soft, nontender.  Positive, normoactive bowel sounds. No organomegaly palpated. MSK:  No focal spinal tenderness to palpation. Full range of motion bilaterally in the upper extremities. EXTREMITIES:  No peripheral edema.   SKIN:  Clear with no obvious rashes or skin changes. No nail dyscrasia. NEURO:  Nonfocal. Well oriented.  Appropriate affect.       LAB RESULTS:  CMP     Component Value Date/Time   NA 141 02/19/2019 1006   K 3.3 (L) 02/19/2019 1006   CL 93 (L) 02/19/2019 1006   CO2 37 (H) 02/19/2019 1006   GLUCOSE 141 (H) 02/19/2019 1006   BUN 13 02/19/2019 1006   CREATININE 0.73 02/19/2019 1006   CREATININE 0.63 12/22/2018 1219   CALCIUM 9.5 02/19/2019 1006   PROT 6.8 02/19/2019 1006   ALBUMIN 2.7 (L) 02/19/2019 1006   AST 11 (L) 02/19/2019 1006   AST 61 (H) 12/22/2018 1219   ALT <6 02/19/2019 1006   ALT 51 (H) 12/22/2018 1219   ALKPHOS 206 (H) 02/19/2019 1006   BILITOT 0.4 02/19/2019 1006   BILITOT 0.5 12/22/2018 1219   GFRNONAA >60 02/19/2019 1006   GFRNONAA >60 12/22/2018 1219   GFRAA >60 02/19/2019 1006   GFRAA >60 12/22/2018 1219    No results found for: TOTALPROTELP, ALBUMINELP, A1GS, A2GS, BETS, BETA2SER, GAMS, MSPIKE, SPEI  No results found for: KPAFRELGTCHN, LAMBDASER, KAPLAMBRATIO  Lab Results  Component Value Date   WBC 2.6 (L) 02/19/2019   NEUTROABS 1.5 (L) 02/19/2019   HGB 7.9 (L) 02/19/2019   HCT 25.4 (L) 02/19/2019   MCV 108.1 (H) 02/19/2019   PLT 214 02/19/2019    _0 @  No results found for: LABCA2  No components found for: [id number]  No results for input(s): INR in the last 168 hours.  No results found for: LABCA2  No results found for: FOY774  No results found for: JOI786  No results found for: VEH209  Lab Results  Component Value Date   CA2729 108.1 (H) 01/29/2019     No components found for: HGQUANT  No results found for: CEA1 / No results found for: CEA1   No results found for: AFPTUMOR  No results found for: [place]  No results found  for: PSA1  No visits with results within 3 Day(s) from this visit.  Latest known visit with results is:  Appointment on 02/19/2019  Component Date Value Ref Range Status  . WBC 02/19/2019 2.6* 4.0 - 10.5 K/uL Final  . RBC 02/19/2019 2.35* 3.87 - 5.11 MIL/uL Final  . Hemoglobin 02/19/2019 7.9* 12.0 - 15.0 g/dL Final  . HCT 02/19/2019 25.4* 36.0 - 46.0 % Final  . MCV 02/19/2019 108.1* 80.0 - 100.0 fL Final  . MCH 02/19/2019 33.6  26.0 - 34.0 pg Final  . MCHC 02/19/2019 31.1  30.0 - 36.0 g/dL Final  . RDW 02/19/2019 20.3* 11.5 - 15.5 % Final  . Platelets 02/19/2019 214  150 - 400 K/uL Final  . nRBC 02/19/2019 0.0  0.0 - 0.2 % Final  . Neutrophils Relative % 02/19/2019 57  % Final  . Neutro Abs 02/19/2019 1.5* 1.7 - 7.7 K/uL Final  . Lymphocytes Relative 02/19/2019 35  % Final  . Lymphs Abs 02/19/2019 0.9  0.7 - 4.0 K/uL Final  . Monocytes Relative 02/19/2019 5  % Final  . Monocytes Absolute 02/19/2019 0.1  0.1 - 1.0 K/uL Final  . Eosinophils Relative 02/19/2019 1  % Final  . Eosinophils Absolute 02/19/2019 0.0  0.0 - 0.5 K/uL Final  . Basophils Relative 02/19/2019 2  % Final  . Basophils Absolute 02/19/2019 0.0  0.0 - 0.1 K/uL Final  . Immature Granulocytes 02/19/2019 0  % Final  . Abs Immature Granulocytes 02/19/2019 0.01  0.00 - 0.07 K/uL Final  . Polychromasia 02/19/2019 PRESENT   Final   Performed at [facility], [place] [address], [place], [place] [address]  . Sodium 02/19/2019 141  135 - 145 mmol/L Final  . Potassium 02/19/2019 3.3* 3.5 - 5.1 mmol/L Final  . Chloride 02/19/2019 93* 98 - 111 mmol/L Final  . CO2 02/19/2019 37* 22 - 32 mmol/L Final  . Glucose, Bld 02/19/2019 141* 70 - 99 mg/dL Final  . BUN 02/19/2019 13  6 - 20 mg/dL Final  . Creatinine, Ser 02/19/2019 0.73   0.44 - 1.00 mg/dL Final  . Calcium 02/19/2019 9.5  8.9 - 10.3 mg/dL Final  . Total Protein 02/19/2019 6.8  6.5 - 8.1 g/dL Final  . Albumin 02/19/2019 2.7* 3.5 - 5.0 g/dL Final  . AST 02/19/2019 11* 15 - 41 U/L Final  . ALT 02/19/2019 <6  0 - 44 U/L Final  . Alkaline Phosphatase 02/19/2019 206* 38 - 126 U/L Final  . Total Bilirubin 02/19/2019 0.4  0.3 - 1.2 mg/dL Final  . GFR calc non Af Amer 02/19/2019 >60  >60 mL/min Final  . GFR calc Af Amer 02/19/2019 >60  >60 mL/min Final  . Anion gap 02/19/2019 11  5 - 15 Final   Performed at [facility], [place] [address], [place], [place] [address]    (this displays the last labs from the last 3 days)  No results found for: TOTALPROTELP, ALBUMINELP, A1GS, A2GS, BETS, BETA2SER, GAMS, MSPIKE, SPEI (this displays SPEP labs)  No results found for: KPAFRELGTCHN, LAMBDASER, KAPLAMBRATIO (kappa/lambda light chains)  No results found for: HGBA, HGBA2QUANT, HGBFQUANT, HGBSQUAN (Hemoglobinopathy evaluation)   Lab Results  Component Value Date   LDH 244 (H) 12/25/2018    Lab Results  Component Value Date   IRON 137 10/26/2018   TIBC 384 10/26/2018   IRONPCTSAT 36 (H) 10/26/2018   (Iron and TIBC)  Lab Results  Component Value Date   FERRITIN 2,306 (H)  12/25/2018    Urinalysis    Component Value Date/Time   COLORURINE STRAW (A) 01/15/2019 1150   APPEARANCEUR CLEAR 01/15/2019 1150   LABSPEC 1.008 01/15/2019 1150   PHURINE 6.0 01/15/2019 1150   GLUCOSEU NEGATIVE 01/15/2019 1150   HGBUR NEGATIVE 01/15/2019 [place] 01/15/2019 1150   KETONESUR NEGATIVE 01/15/2019 1150   PROTEINUR NEGATIVE 01/15/2019 1150   NITRITE NEGATIVE 01/15/2019 1150   LEUKOCYTESUR TRACE (A) 01/15/2019 1150     STUDIES:  No results found.    ELIGIBLE FOR AVAILABLE RESEARCH PROTOCOL: no   ASSESSMENT: 58 y.o. [place], [place] woman presenting 10/26/2018 with right-sided inflammatory breast cancer, stage  IV, involvnh lungs, lymph nodes and bones, as follows:  (a) chest CT scan 10/26/2018 shows bilateral pleural effusions, possible lymphangitic spread of tumor, diffuse bony metastatic disease, and significant axillary mediastinal and hilar adenopathy  (b) bone scan 10/27/2018 is a "near [place]" consistent with widespread bony metastatic disease  (c) head CT with and without contrast 10/30/2018 shows no intracranial metastatic disease, multiple calvarial lesions  (d) CA-27-29 on 10/27/2018 was 1810.4  (1) pleural fluid from right thoracentesis 10/28/2018 confirms malignant cells consistent with a breast primary, strongly estrogen and progesterone receptor positive, HER-2 not amplified, with an MIB-1 of 2%  (2) anastrozole started 10/29/2018  (a) palbociclib to start 11/13/2018 at 100 mg dose  (b) denosumab/xgeva started 11/13/2018   (3) associated problems:  (a) hypoxia secondary to effusions  (b) pain from bone lesions and left humeral and vertebral compression fractures  (c) right upper extremity lymphedema  (d) anemia, requiring transfusion  (e) poor venous access  (4) genetics testing on 12/25/2018 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 STK11, TP53, TSC1, TSC2, and VHL.  The following genes were evaluated for sequence changes only: SDHA and HOXB13 c.251G>A variant only. The [facility] Melanoma Panel analyzed the following 9 genes: BAP1 BRCA2 CDK4 CDKN2A MITF POT1 PTEN RB1 Tp53.      PLAN: [place]   She will return here in 4 weeks for her next set of labs and visit.  She knows to call for any other issue that may develop before the next appointment.   Wilber Bihari, NP Medical Oncology and Hematology [facility] [place], [place] [address] Tel. [phone]    Fax. [phone]

## 2019-04-01 NOTE — Telephone Encounter (Signed)
Daughter states the wheelchair ordered by the hospitalist did not have the height or weight on it. Chair is too big. Updated order faxed to [facility]. [phone]

## 2019-04-01 NOTE — Telephone Encounter (Signed)
Returned patient's phone call regarding rescheduling an appointment, patient's voicemail is not set up.

## 2019-04-02 ENCOUNTER — Encounter: Payer: Self-pay | Admitting: General Practice

## 2019-04-02 ENCOUNTER — Ambulatory Visit: Payer: Medicaid Other | Attending: Family Medicine | Admitting: Family Medicine

## 2019-04-02 ENCOUNTER — Encounter: Payer: Self-pay | Admitting: Family Medicine

## 2019-04-02 ENCOUNTER — Other Ambulatory Visit: Payer: Self-pay

## 2019-04-02 DIAGNOSIS — C7801 Secondary malignant neoplasm of right lung: Secondary | ICD-10-CM

## 2019-04-02 DIAGNOSIS — C50811 Malignant neoplasm of overlapping sites of right female breast: Secondary | ICD-10-CM | POA: Diagnosis not present

## 2019-04-02 DIAGNOSIS — F329 Major depressive disorder, single episode, unspecified: Secondary | ICD-10-CM | POA: Diagnosis not present

## 2019-04-02 DIAGNOSIS — C7951 Secondary malignant neoplasm of bone: Secondary | ICD-10-CM | POA: Diagnosis not present

## 2019-04-02 DIAGNOSIS — Z170 Estrogen receptor positive status [ER+]: Secondary | ICD-10-CM | POA: Diagnosis not present

## 2019-04-02 DIAGNOSIS — M533 Sacrococcygeal disorders, not elsewhere classified: Secondary | ICD-10-CM | POA: Diagnosis not present

## 2019-04-02 MED ORDER — SERTRALINE HCL 50 MG PO TABS: ORAL_TABLET | ORAL | 4 refills | Status: DC

## 2019-04-02 NOTE — Progress Notes (Signed)
Virtual Visit via Telephone Note  I connected with Suzanne [name] on 04/02/19 at  3:30 PM EST by telephone and verified that I am speaking with the correct person using two identifiers.   I discussed the limitations, risks, security and privacy concerns of performing an evaluation and management service by telephone and the availability of in person appointments. I also discussed with the patient that there may be a patient responsible charge related to this service. The patient expressed understanding and agreed to proceed.  Patient Location: Home Provider Location: [facility] Office Others participating in call: call initiated by [place], CMA then transferred to me; patient's daughter, Suzanne [name],  also participated in the call   History of Present Illness:       58 year old female who is somewhat new to the practice after having telemedicine visit on 12/04/2018 by another provider in follow-up of hospitalization in May.  Patient moved to the area earlier this year to live with her daughter.  She is status post hospitalization at the end of May with new diagnosis of metastatic right breast cancer after presenting to the emergency department due to worsening shortness of breath and patient was found to have severe anemia with hemoglobin of 4.5 and chest CT showed moderate bilateral pleural effusions, thickening and reticulonodular lung opacities bilaterally and numerous mediastinal, hilar and axillary lymph nodes suggestive of metastatic lung disease.  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  Patient had normal BNP during admission but echo also done due to patient's shortness of breath and peripheral edema on presentation per hospital records from her May admission.  She was also hospitalized in late July through early August for sepsis due to sacral decubital ulcer, and also had ORIF of the left humerus due to a pathologic fracture.          She is status post  hospital admission in early September due to sudden increase in mid back pain.  Patient was admitted from 02/03/2019 through 02/07/2019 due to intractable back pain and was found to have subacute to acute T12 compression deformity on CT. patient also had hypokalemia with serum potassium of 2.7 which was corrected during hospitalization.  Coreg was continued for control of hypertension.  Patient with hemoglobin of 9.  8 on admission and had received transfusion during initial hospitalization in July.  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         She does not currently have a primary care provider and today's visit is to establish care.  She reports that she is feeling better.  She and her daughter, Suzanne [name] with whom patient lives, report that patient with improvement in appetite and is eating several small meals daily.  She is not having any issues with nausea.  She is currently on narcotic pain medication related to back pain from vertebral fracture and bone metastases from her breast cancer.  She has also developed some pain at the sacrum which is currently her greatest area of pain.  She does not have any skin breakdown over the sacrum/coccyx or buttocks.  She is receiving home health care. Pain is limiting her ability to sit for more than 5 minutes at a time in her wheelchair She is able to ambulate for short distances with the use of a walker and has some increased pain with activity.  Pain with activity is about a 8 on a 0-to-10 scale when  sitting up and about a 4-6 at rest.  She was able to take otc pain medication at times for control of pain prior to onset of sacral pain a few days ago.   She has used laxatives as needed for constipation and denies any significant issues with constipation or abdominal pain at this time.          She has had no recent increase in cough or shortness of breath.  No fever or chills.  She  continues to use her oxygen at 2 L.  She does have increased shortness of breath with exertion/activity.  She has had telemedicine visits with oncology in follow-up of her breast cancer.  Daughter has noticed that patient with increased sadness/depression due to her diagnosis of cancer and issues with pain and limitations, reduced quality of life.  Patient would be interested in starting medication to help with the depression related to her illness.   Past Medical History:  Diagnosis Date  . Cancer ([place])   . Family history of breast cancer   . Family history of colon cancer   . Family history of melanoma   . Family history of prostate cancer   . Gallstones   . Glaucoma   . Mastitis    reports history of recurrent mastitis  . Metastatic breast cancer ([place])   . Obesity     Past Surgical History:  Procedure Laterality Date  . GLAUCOMA SURGERY  late 1990's  . IR THORACENTESIS ASP PLEURAL SPACE W/IMG GUIDE  10/28/2018  . IR THORACENTESIS ASP PLEURAL SPACE W/IMG GUIDE  10/29/2018  . OPEN REDUCTION INTERNAL FIXATION (ORIF) DISTAL RADIAL FRACTURE Left 01/09/2019   Procedure: OPEN REDUCTION INTERNAL FIXATION (ORIF) HUMERAL FRACTURE;  Surgeon: Netta Cedars, MD;  Location: [facility];  Service: Orthopedics;  Laterality: Left;    Family History  Problem Relation Age of Onset  . Breast cancer Sister        in her 66's-70s  . Heart disease Brother        CABG  . Heart disease Brother        CABG  . Skin cancer Brother        melanoma  . Colon cancer Cousin   . Heart attack Mother   . Heart attack Father   . Prostate cancer Brother     Social History   Tobacco Use  . Smoking status: Never Smoker  . Smokeless tobacco: Never Used  . Tobacco comment: second hand smoke exposure  Substance Use Topics  . Alcohol use: No  . Drug use: No     No Known Allergies     Observations/Objective: No vital signs or physical exam conducted as visit was done via telephone  Assessment and Plan: 1.  Malignant neoplasm metastatic to right lung ([place]) 2. Malignant neoplasm of overlapping sites of right breast in female, estrogen receptor positive ([place]) She has been followed by oncology status post hospitalization in July and diagnosis of metastatic right breast cancer along with metastatic pleural effusions contributing to chronic respiratory failure for which patient is currently on continuous oxygen therapy.  Oncology is currently handling pain management and patient reports that she has had decrease in thoracic and lumbar spinal pain but new onset of pain in the sacral area.  Patient will continue follow-up with oncology and daughter should notify oncologist if patient continues to have worsening of sacral pain and patient may require ED evaluation if continued worsening.  She is to continue continuous oxygen therapy as  well as continuation of Coreg with continued follow-up of blood pressure by home health.  She also has concerns regarding depression related to her diagnosis and overall health status and she agrees to start sertraline for reactive depression. - sertraline (ZOLOFT) 50 MG tablet; Two pills once per day at bedtime  Dispense: 60 tablet; Refill: 4  3. Bone metastases ([place]) 4. Sacral back pain Patient with diffuse bony metastasis of breast cancer noted on imaging done during hospitalization in July and discussed with patient/daughter that patient is back pain may represent bony metastasis and if back pain continues to worsen, she may need to go to the emergency department for further evaluation and/or changes in medication to help control pain.  Daughter is also encouraged to contact oncology regarding the need for changes in pain medication if continued worsening of pain.  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  Prescription provided for sertraline  50 mg and discuss starting medication at 25 mg or half pill for the first 6 nights then increase to 50 mg.  Call or return sooner if issues with increased depression or any difficulty tolerating the medication. - sertraline (ZOLOFT) 50 MG tablet; one pill once per day at bedtime  Dispense: 30 tablet; Refill: 4  Follow Up Instructions:Return in about 2 weeks (around 04/16/2019).    I discussed the assessment and treatment plan with the patient. The patient was provided an opportunity to ask questions and all were answered. The patient agreed with the plan and demonstrated an understanding of the instructions.   The patient was advised to call back or seek an in-person evaluation if the symptoms worsen or if the condition fails to improve as anticipated.  I provided 26 minutes of non-face-to-face time during this encounter.   Antony Blackbird, MD

## 2019-04-02 NOTE — Progress Notes (Signed)
[place] CSW Progress Notes  Call from [place] - patient is receiving large bills from [employer] DME provider for hospital bed and O2.  Family thought this would be covered under charity care and/or [place].  CSW communicated w leadership, [employer] rep investigated situation, billing issues are now resolved per [employer].  [name], LCSW Clinical Social Worker Phone:  [phone] Cell:  [phone]

## 2019-04-02 NOTE — Progress Notes (Signed)
Patient verified DOB Patient has eaten today. Patient complains of pain at an 8 over night @6 :00am. Pain in the tailbone began on Friday with sitting up. Pain in the tailbone on Tuesday has presented while being up. PT states the patients pulse ox drops when sitting up in the wheelchair. Patient can tolerate sitting in the wheelchiair for 3 to 5 minutes.

## 2019-04-03 ENCOUNTER — Other Ambulatory Visit: Payer: Self-pay | Admitting: *Deleted

## 2019-04-03 DIAGNOSIS — D630 Anemia in neoplastic disease: Secondary | ICD-10-CM | POA: Diagnosis not present

## 2019-04-03 DIAGNOSIS — M8458XD Pathological fracture in neoplastic disease, other specified site, subsequent encounter for fracture with routine healing: Secondary | ICD-10-CM | POA: Diagnosis not present

## 2019-04-03 DIAGNOSIS — K5900 Constipation, unspecified: Secondary | ICD-10-CM | POA: Diagnosis not present

## 2019-04-03 DIAGNOSIS — M810 Age-related osteoporosis without current pathological fracture: Secondary | ICD-10-CM | POA: Diagnosis not present

## 2019-04-03 DIAGNOSIS — I10 Essential (primary) hypertension: Secondary | ICD-10-CM | POA: Diagnosis not present

## 2019-04-03 DIAGNOSIS — G893 Neoplasm related pain (acute) (chronic): Secondary | ICD-10-CM | POA: Diagnosis not present

## 2019-04-03 DIAGNOSIS — M84522D Pathological fracture in neoplastic disease, left humerus, subsequent encounter for fracture with routine healing: Secondary | ICD-10-CM | POA: Diagnosis not present

## 2019-04-03 DIAGNOSIS — E669 Obesity, unspecified: Secondary | ICD-10-CM | POA: Diagnosis not present

## 2019-04-03 DIAGNOSIS — C50811 Malignant neoplasm of overlapping sites of right female breast: Secondary | ICD-10-CM | POA: Diagnosis not present

## 2019-04-03 DIAGNOSIS — Z170 Estrogen receptor positive status [ER+]: Secondary | ICD-10-CM | POA: Diagnosis not present

## 2019-04-03 DIAGNOSIS — C7951 Secondary malignant neoplasm of bone: Secondary | ICD-10-CM | POA: Diagnosis not present

## 2019-04-03 DIAGNOSIS — C7801 Secondary malignant neoplasm of right lung: Secondary | ICD-10-CM | POA: Diagnosis not present

## 2019-04-03 DIAGNOSIS — J9611 Chronic respiratory failure with hypoxia: Secondary | ICD-10-CM | POA: Diagnosis not present

## 2019-04-03 DIAGNOSIS — M47816 Spondylosis without myelopathy or radiculopathy, lumbar region: Secondary | ICD-10-CM | POA: Diagnosis not present

## 2019-04-03 DIAGNOSIS — Z9181 History of falling: Secondary | ICD-10-CM | POA: Diagnosis not present

## 2019-04-09 DIAGNOSIS — M810 Age-related osteoporosis without current pathological fracture: Secondary | ICD-10-CM | POA: Diagnosis not present

## 2019-04-09 DIAGNOSIS — I10 Essential (primary) hypertension: Secondary | ICD-10-CM | POA: Diagnosis not present

## 2019-04-09 DIAGNOSIS — D630 Anemia in neoplastic disease: Secondary | ICD-10-CM | POA: Diagnosis not present

## 2019-04-09 DIAGNOSIS — J9611 Chronic respiratory failure with hypoxia: Secondary | ICD-10-CM | POA: Diagnosis not present

## 2019-04-09 DIAGNOSIS — G893 Neoplasm related pain (acute) (chronic): Secondary | ICD-10-CM | POA: Diagnosis not present

## 2019-04-09 DIAGNOSIS — S22000A Wedge compression fracture of unspecified thoracic vertebra, initial encounter for closed fracture: Secondary | ICD-10-CM | POA: Diagnosis not present

## 2019-04-09 DIAGNOSIS — E669 Obesity, unspecified: Secondary | ICD-10-CM | POA: Diagnosis not present

## 2019-04-09 DIAGNOSIS — Z9181 History of falling: Secondary | ICD-10-CM | POA: Diagnosis not present

## 2019-04-09 DIAGNOSIS — K5900 Constipation, unspecified: Secondary | ICD-10-CM | POA: Diagnosis not present

## 2019-04-09 DIAGNOSIS — C7951 Secondary malignant neoplasm of bone: Secondary | ICD-10-CM | POA: Diagnosis not present

## 2019-04-09 DIAGNOSIS — M84522D Pathological fracture in neoplastic disease, left humerus, subsequent encounter for fracture with routine healing: Secondary | ICD-10-CM | POA: Diagnosis not present

## 2019-04-09 DIAGNOSIS — Z170 Estrogen receptor positive status [ER+]: Secondary | ICD-10-CM | POA: Diagnosis not present

## 2019-04-09 DIAGNOSIS — C50811 Malignant neoplasm of overlapping sites of right female breast: Secondary | ICD-10-CM | POA: Diagnosis not present

## 2019-04-09 DIAGNOSIS — C7801 Secondary malignant neoplasm of right lung: Secondary | ICD-10-CM | POA: Diagnosis not present

## 2019-04-09 DIAGNOSIS — M47816 Spondylosis without myelopathy or radiculopathy, lumbar region: Secondary | ICD-10-CM | POA: Diagnosis not present

## 2019-04-09 DIAGNOSIS — M8458XD Pathological fracture in neoplastic disease, other specified site, subsequent encounter for fracture with routine healing: Secondary | ICD-10-CM | POA: Diagnosis not present

## 2019-04-13 ENCOUNTER — Telehealth: Payer: Self-pay

## 2019-04-13 DIAGNOSIS — M47816 Spondylosis without myelopathy or radiculopathy, lumbar region: Secondary | ICD-10-CM | POA: Diagnosis not present

## 2019-04-13 DIAGNOSIS — C7801 Secondary malignant neoplasm of right lung: Secondary | ICD-10-CM | POA: Diagnosis not present

## 2019-04-13 DIAGNOSIS — C50811 Malignant neoplasm of overlapping sites of right female breast: Secondary | ICD-10-CM | POA: Diagnosis not present

## 2019-04-13 DIAGNOSIS — M8458XD Pathological fracture in neoplastic disease, other specified site, subsequent encounter for fracture with routine healing: Secondary | ICD-10-CM | POA: Diagnosis not present

## 2019-04-13 DIAGNOSIS — G893 Neoplasm related pain (acute) (chronic): Secondary | ICD-10-CM | POA: Diagnosis not present

## 2019-04-13 DIAGNOSIS — C7951 Secondary malignant neoplasm of bone: Secondary | ICD-10-CM | POA: Diagnosis not present

## 2019-04-13 DIAGNOSIS — Z9181 History of falling: Secondary | ICD-10-CM | POA: Diagnosis not present

## 2019-04-13 DIAGNOSIS — K5900 Constipation, unspecified: Secondary | ICD-10-CM | POA: Diagnosis not present

## 2019-04-13 DIAGNOSIS — D630 Anemia in neoplastic disease: Secondary | ICD-10-CM | POA: Diagnosis not present

## 2019-04-13 DIAGNOSIS — E669 Obesity, unspecified: Secondary | ICD-10-CM | POA: Diagnosis not present

## 2019-04-13 DIAGNOSIS — I10 Essential (primary) hypertension: Secondary | ICD-10-CM | POA: Diagnosis not present

## 2019-04-13 DIAGNOSIS — J9611 Chronic respiratory failure with hypoxia: Secondary | ICD-10-CM | POA: Diagnosis not present

## 2019-04-13 DIAGNOSIS — M84522D Pathological fracture in neoplastic disease, left humerus, subsequent encounter for fracture with routine healing: Secondary | ICD-10-CM | POA: Diagnosis not present

## 2019-04-13 DIAGNOSIS — M810 Age-related osteoporosis without current pathological fracture: Secondary | ICD-10-CM | POA: Diagnosis not present

## 2019-04-13 DIAGNOSIS — Z170 Estrogen receptor positive status [ER+]: Secondary | ICD-10-CM | POA: Diagnosis not present

## 2019-04-13 NOTE — Telephone Encounter (Signed)
RN returned call to [employer], RN with [facility].    Crystal, RN Horticulturist, commercial on visit 11/16.  Per Crystal, RN pt reporting increased pain to tailbone and hips.  Currently using Oxycodone and Tylenol with minimal effectiveness.     Crystal, RN reporting that patient has rash to groin area, and under breast.  Inquiring if it's OK to use hydrocortisone cream.     Pt reports to home health nurse that she is wanting to try to "wean off oxygen."  Pt currently on continuous oxygen.     This RN left voicemail to [employer], RN returning call [phone].  Contact information left for return call.

## 2019-04-14 ENCOUNTER — Encounter: Payer: Self-pay | Admitting: General Practice

## 2019-04-14 NOTE — Progress Notes (Signed)
[place] CSW Progress Notes  Message from daughter, Dannial Monarch Friday we got smaller wheelchair w reclining back.  But  mom can only sit for 12 minutes maximum.  She is not certain that she can tolerate a reclining wheelchair transport to [facility] appointments.  Wants EMS transport to [facility] for her appointments.  "There's no guarantee that she can make it to the [place]."  Wants to know if Medicaid will cover EMS transport to [facility].  Information relayed to oncologists desk nurse, will need to discuss patient's needs and available options for support.  [name], LCSW Clinical Social Worker Phone:  [phone]

## 2019-04-15 ENCOUNTER — Encounter: Payer: Self-pay | Admitting: General Practice

## 2019-04-15 NOTE — Progress Notes (Signed)
[place] CSW Progress Notes  Confirmed that patient has been approved for [employer].  Approval sent to [facility] on 11/1.  However, learned from [place] that [facility] does not have staff available to provide this service in patient's location.  [facility] CSW will contact family and advise them that they will need to choose another [facility] provider.  [facility] Lemmings is an option and does have availability.  [facility] CSW will assist family making transition to [facility] provider of their choice.  [name], LCSW Clinical Social Worker Phone:  [phone] Cell: [phone]

## 2019-04-17 ENCOUNTER — Encounter: Payer: Self-pay | Admitting: General Practice

## 2019-04-17 NOTE — Progress Notes (Signed)
[place] CSW Progress Notes  Spoke w daughter [name] re [employer] program, she will call CSW [facility] w requests next week.  Daughter also states that [place] completed their assessment for [facility] [place] - aide will start next week.  Has been assigned approx 2 hours/day 5 days/week.  [name], LCSW Clinical Social Worker Phone:  [phone] Cell:  [phone]

## 2019-04-19 DIAGNOSIS — M810 Age-related osteoporosis without current pathological fracture: Secondary | ICD-10-CM | POA: Diagnosis not present

## 2019-04-19 DIAGNOSIS — Z9181 History of falling: Secondary | ICD-10-CM | POA: Diagnosis not present

## 2019-04-19 DIAGNOSIS — D630 Anemia in neoplastic disease: Secondary | ICD-10-CM | POA: Diagnosis not present

## 2019-04-19 DIAGNOSIS — K5900 Constipation, unspecified: Secondary | ICD-10-CM | POA: Diagnosis not present

## 2019-04-19 DIAGNOSIS — J9611 Chronic respiratory failure with hypoxia: Secondary | ICD-10-CM | POA: Diagnosis not present

## 2019-04-19 DIAGNOSIS — I10 Essential (primary) hypertension: Secondary | ICD-10-CM | POA: Diagnosis not present

## 2019-04-19 DIAGNOSIS — M84522D Pathological fracture in neoplastic disease, left humerus, subsequent encounter for fracture with routine healing: Secondary | ICD-10-CM | POA: Diagnosis not present

## 2019-04-19 DIAGNOSIS — E669 Obesity, unspecified: Secondary | ICD-10-CM | POA: Diagnosis not present

## 2019-04-19 DIAGNOSIS — M8458XD Pathological fracture in neoplastic disease, other specified site, subsequent encounter for fracture with routine healing: Secondary | ICD-10-CM | POA: Diagnosis not present

## 2019-04-19 DIAGNOSIS — C50811 Malignant neoplasm of overlapping sites of right female breast: Secondary | ICD-10-CM | POA: Diagnosis not present

## 2019-04-19 DIAGNOSIS — C7951 Secondary malignant neoplasm of bone: Secondary | ICD-10-CM | POA: Diagnosis not present

## 2019-04-19 DIAGNOSIS — G893 Neoplasm related pain (acute) (chronic): Secondary | ICD-10-CM | POA: Diagnosis not present

## 2019-04-19 DIAGNOSIS — Z170 Estrogen receptor positive status [ER+]: Secondary | ICD-10-CM | POA: Diagnosis not present

## 2019-04-19 DIAGNOSIS — M47816 Spondylosis without myelopathy or radiculopathy, lumbar region: Secondary | ICD-10-CM | POA: Diagnosis not present

## 2019-04-19 DIAGNOSIS — C7801 Secondary malignant neoplasm of right lung: Secondary | ICD-10-CM | POA: Diagnosis not present

## 2019-04-21 DIAGNOSIS — C7801 Secondary malignant neoplasm of right lung: Secondary | ICD-10-CM | POA: Diagnosis not present

## 2019-04-21 DIAGNOSIS — C7951 Secondary malignant neoplasm of bone: Secondary | ICD-10-CM | POA: Diagnosis not present

## 2019-04-21 DIAGNOSIS — D649 Anemia, unspecified: Secondary | ICD-10-CM | POA: Diagnosis not present

## 2019-04-21 DIAGNOSIS — J90 Pleural effusion, not elsewhere classified: Secondary | ICD-10-CM | POA: Diagnosis not present

## 2019-04-21 DIAGNOSIS — G893 Neoplasm related pain (acute) (chronic): Secondary | ICD-10-CM | POA: Diagnosis not present

## 2019-04-21 DIAGNOSIS — R0602 Shortness of breath: Secondary | ICD-10-CM | POA: Diagnosis not present

## 2019-04-21 DIAGNOSIS — C50811 Malignant neoplasm of overlapping sites of right female breast: Secondary | ICD-10-CM | POA: Diagnosis not present

## 2019-04-22 DIAGNOSIS — C50811 Malignant neoplasm of overlapping sites of right female breast: Secondary | ICD-10-CM | POA: Diagnosis not present

## 2019-04-22 DIAGNOSIS — C7951 Secondary malignant neoplasm of bone: Secondary | ICD-10-CM | POA: Diagnosis not present

## 2019-04-22 DIAGNOSIS — G893 Neoplasm related pain (acute) (chronic): Secondary | ICD-10-CM | POA: Diagnosis not present

## 2019-04-22 DIAGNOSIS — D649 Anemia, unspecified: Secondary | ICD-10-CM | POA: Diagnosis not present

## 2019-04-22 DIAGNOSIS — R0602 Shortness of breath: Secondary | ICD-10-CM | POA: Diagnosis not present

## 2019-04-22 DIAGNOSIS — J90 Pleural effusion, not elsewhere classified: Secondary | ICD-10-CM | POA: Diagnosis not present

## 2019-04-22 DIAGNOSIS — C7801 Secondary malignant neoplasm of right lung: Secondary | ICD-10-CM | POA: Diagnosis not present

## 2019-04-23 DIAGNOSIS — D649 Anemia, unspecified: Secondary | ICD-10-CM | POA: Diagnosis not present

## 2019-04-23 DIAGNOSIS — J90 Pleural effusion, not elsewhere classified: Secondary | ICD-10-CM | POA: Diagnosis not present

## 2019-04-23 DIAGNOSIS — G893 Neoplasm related pain (acute) (chronic): Secondary | ICD-10-CM | POA: Diagnosis not present

## 2019-04-23 DIAGNOSIS — C7801 Secondary malignant neoplasm of right lung: Secondary | ICD-10-CM | POA: Diagnosis not present

## 2019-04-23 DIAGNOSIS — R0602 Shortness of breath: Secondary | ICD-10-CM | POA: Diagnosis not present

## 2019-04-23 DIAGNOSIS — C7951 Secondary malignant neoplasm of bone: Secondary | ICD-10-CM | POA: Diagnosis not present

## 2019-04-23 DIAGNOSIS — C50811 Malignant neoplasm of overlapping sites of right female breast: Secondary | ICD-10-CM | POA: Diagnosis not present

## 2019-04-27 DIAGNOSIS — J90 Pleural effusion, not elsewhere classified: Secondary | ICD-10-CM | POA: Diagnosis not present

## 2019-04-27 DIAGNOSIS — D649 Anemia, unspecified: Secondary | ICD-10-CM | POA: Diagnosis not present

## 2019-04-27 DIAGNOSIS — C50811 Malignant neoplasm of overlapping sites of right female breast: Secondary | ICD-10-CM | POA: Diagnosis not present

## 2019-04-27 DIAGNOSIS — C7801 Secondary malignant neoplasm of right lung: Secondary | ICD-10-CM | POA: Diagnosis not present

## 2019-04-27 DIAGNOSIS — C7951 Secondary malignant neoplasm of bone: Secondary | ICD-10-CM | POA: Diagnosis not present

## 2019-04-27 DIAGNOSIS — G893 Neoplasm related pain (acute) (chronic): Secondary | ICD-10-CM | POA: Diagnosis not present

## 2019-04-27 DIAGNOSIS — R0602 Shortness of breath: Secondary | ICD-10-CM | POA: Diagnosis not present

## 2019-04-28 DIAGNOSIS — C7801 Secondary malignant neoplasm of right lung: Secondary | ICD-10-CM | POA: Diagnosis not present

## 2019-04-28 DIAGNOSIS — C50811 Malignant neoplasm of overlapping sites of right female breast: Secondary | ICD-10-CM | POA: Diagnosis not present

## 2019-04-28 DIAGNOSIS — R0602 Shortness of breath: Secondary | ICD-10-CM | POA: Diagnosis not present

## 2019-04-28 DIAGNOSIS — D649 Anemia, unspecified: Secondary | ICD-10-CM | POA: Diagnosis not present

## 2019-04-28 DIAGNOSIS — C7951 Secondary malignant neoplasm of bone: Secondary | ICD-10-CM | POA: Diagnosis not present

## 2019-04-28 DIAGNOSIS — G893 Neoplasm related pain (acute) (chronic): Secondary | ICD-10-CM | POA: Diagnosis not present

## 2019-04-28 DIAGNOSIS — S22000A Wedge compression fracture of unspecified thoracic vertebra, initial encounter for closed fracture: Secondary | ICD-10-CM | POA: Diagnosis not present

## 2019-04-28 DIAGNOSIS — J90 Pleural effusion, not elsewhere classified: Secondary | ICD-10-CM | POA: Diagnosis not present

## 2019-04-29 ENCOUNTER — Telehealth: Payer: Self-pay

## 2019-04-29 ENCOUNTER — Other Ambulatory Visit: Payer: Self-pay

## 2019-04-29 DIAGNOSIS — C7951 Secondary malignant neoplasm of bone: Secondary | ICD-10-CM | POA: Diagnosis not present

## 2019-04-29 DIAGNOSIS — C50811 Malignant neoplasm of overlapping sites of right female breast: Secondary | ICD-10-CM | POA: Diagnosis not present

## 2019-04-29 DIAGNOSIS — C7801 Secondary malignant neoplasm of right lung: Secondary | ICD-10-CM | POA: Diagnosis not present

## 2019-04-29 DIAGNOSIS — J90 Pleural effusion, not elsewhere classified: Secondary | ICD-10-CM | POA: Diagnosis not present

## 2019-04-29 DIAGNOSIS — G893 Neoplasm related pain (acute) (chronic): Secondary | ICD-10-CM | POA: Diagnosis not present

## 2019-04-29 DIAGNOSIS — R0602 Shortness of breath: Secondary | ICD-10-CM | POA: Diagnosis not present

## 2019-04-29 DIAGNOSIS — D649 Anemia, unspecified: Secondary | ICD-10-CM | POA: Diagnosis not present

## 2019-04-29 MED ORDER — PALBOCICLIB 100 MG PO CAPS: 100.0000 mg | ORAL_CAPSULE | Freq: Every day | ORAL | 0 refills | Status: DC

## 2019-04-29 NOTE — Telephone Encounter (Signed)
Patient's daughter, [name], called to see if she should continue on Ibrance without lab work.  Per daughter they are still waiting to hear back from [facility] regarding set up.  RN will send message to social work to follow up with this request.   RN reviewed with daughter, patient currently on off week. Denies any diarrhea, abnormal bruising or bleeding.    Per MD, continue on Ibrace, refill sent by RN.   Pt voiced they would be in area with transportation from a different family member next week.  RN encouraged to call office when they know the date and we can add lab appointment.  No further needs at this time.

## 2019-04-30 DIAGNOSIS — Z9181 History of falling: Secondary | ICD-10-CM | POA: Diagnosis not present

## 2019-04-30 DIAGNOSIS — D649 Anemia, unspecified: Secondary | ICD-10-CM | POA: Diagnosis not present

## 2019-04-30 DIAGNOSIS — C7801 Secondary malignant neoplasm of right lung: Secondary | ICD-10-CM | POA: Diagnosis not present

## 2019-04-30 DIAGNOSIS — J9611 Chronic respiratory failure with hypoxia: Secondary | ICD-10-CM | POA: Diagnosis not present

## 2019-04-30 DIAGNOSIS — Z170 Estrogen receptor positive status [ER+]: Secondary | ICD-10-CM | POA: Diagnosis not present

## 2019-04-30 DIAGNOSIS — M47816 Spondylosis without myelopathy or radiculopathy, lumbar region: Secondary | ICD-10-CM | POA: Diagnosis not present

## 2019-04-30 DIAGNOSIS — C7951 Secondary malignant neoplasm of bone: Secondary | ICD-10-CM | POA: Diagnosis not present

## 2019-04-30 DIAGNOSIS — D630 Anemia in neoplastic disease: Secondary | ICD-10-CM | POA: Diagnosis not present

## 2019-04-30 DIAGNOSIS — G893 Neoplasm related pain (acute) (chronic): Secondary | ICD-10-CM | POA: Diagnosis not present

## 2019-04-30 DIAGNOSIS — C50811 Malignant neoplasm of overlapping sites of right female breast: Secondary | ICD-10-CM | POA: Diagnosis not present

## 2019-04-30 DIAGNOSIS — M810 Age-related osteoporosis without current pathological fracture: Secondary | ICD-10-CM | POA: Diagnosis not present

## 2019-04-30 DIAGNOSIS — M8458XD Pathological fracture in neoplastic disease, other specified site, subsequent encounter for fracture with routine healing: Secondary | ICD-10-CM | POA: Diagnosis not present

## 2019-04-30 DIAGNOSIS — I10 Essential (primary) hypertension: Secondary | ICD-10-CM | POA: Diagnosis not present

## 2019-04-30 DIAGNOSIS — M84522D Pathological fracture in neoplastic disease, left humerus, subsequent encounter for fracture with routine healing: Secondary | ICD-10-CM | POA: Diagnosis not present

## 2019-04-30 DIAGNOSIS — R0602 Shortness of breath: Secondary | ICD-10-CM | POA: Diagnosis not present

## 2019-04-30 DIAGNOSIS — J90 Pleural effusion, not elsewhere classified: Secondary | ICD-10-CM | POA: Diagnosis not present

## 2019-04-30 DIAGNOSIS — E669 Obesity, unspecified: Secondary | ICD-10-CM | POA: Diagnosis not present

## 2019-04-30 DIAGNOSIS — K5900 Constipation, unspecified: Secondary | ICD-10-CM | POA: Diagnosis not present

## 2019-05-01 DIAGNOSIS — G893 Neoplasm related pain (acute) (chronic): Secondary | ICD-10-CM | POA: Diagnosis not present

## 2019-05-01 DIAGNOSIS — J90 Pleural effusion, not elsewhere classified: Secondary | ICD-10-CM | POA: Diagnosis not present

## 2019-05-01 DIAGNOSIS — R0602 Shortness of breath: Secondary | ICD-10-CM | POA: Diagnosis not present

## 2019-05-01 DIAGNOSIS — C7801 Secondary malignant neoplasm of right lung: Secondary | ICD-10-CM | POA: Diagnosis not present

## 2019-05-01 DIAGNOSIS — C50811 Malignant neoplasm of overlapping sites of right female breast: Secondary | ICD-10-CM | POA: Diagnosis not present

## 2019-05-01 DIAGNOSIS — C7951 Secondary malignant neoplasm of bone: Secondary | ICD-10-CM | POA: Diagnosis not present

## 2019-05-01 DIAGNOSIS — D649 Anemia, unspecified: Secondary | ICD-10-CM | POA: Diagnosis not present

## 2019-05-04 ENCOUNTER — Telehealth: Payer: Self-pay | Admitting: *Deleted

## 2019-05-04 DIAGNOSIS — C7951 Secondary malignant neoplasm of bone: Secondary | ICD-10-CM | POA: Diagnosis not present

## 2019-05-04 DIAGNOSIS — D649 Anemia, unspecified: Secondary | ICD-10-CM | POA: Diagnosis not present

## 2019-05-04 DIAGNOSIS — R0602 Shortness of breath: Secondary | ICD-10-CM | POA: Diagnosis not present

## 2019-05-04 DIAGNOSIS — C50811 Malignant neoplasm of overlapping sites of right female breast: Secondary | ICD-10-CM | POA: Diagnosis not present

## 2019-05-04 DIAGNOSIS — G893 Neoplasm related pain (acute) (chronic): Secondary | ICD-10-CM | POA: Diagnosis not present

## 2019-05-04 DIAGNOSIS — C7801 Secondary malignant neoplasm of right lung: Secondary | ICD-10-CM | POA: Diagnosis not present

## 2019-05-04 DIAGNOSIS — J90 Pleural effusion, not elsewhere classified: Secondary | ICD-10-CM | POA: Diagnosis not present

## 2019-05-04 NOTE — Telephone Encounter (Signed)
VM received from the pt's daughter- Suzanne [name]- stating she has received a letter in the mail today stating " decline of service per medicaid for oxygen concentrator "  She states she also spoke with " a lady earlier today from medicaid that was going to contact mom's primary MD about the oxygen concentrator "  Suzanne [name] is unsure what to do since she has communication from letter and a phone call.   " the letter says I have until January the 4th to appeal - so is that what I need to do ?"  This note will be forwarded to SW/Support services for possible resources per above.   Per Medical opinion pt requires constant oxygen use and due to doctor visits concentrator necessary, especially during Covid when pt is unable per policies to have a family member present to assist with care during transportation and office visits.

## 2019-05-05 ENCOUNTER — Encounter: Payer: Self-pay | Admitting: *Deleted

## 2019-05-05 DIAGNOSIS — C7951 Secondary malignant neoplasm of bone: Secondary | ICD-10-CM | POA: Diagnosis not present

## 2019-05-05 DIAGNOSIS — G893 Neoplasm related pain (acute) (chronic): Secondary | ICD-10-CM | POA: Diagnosis not present

## 2019-05-05 DIAGNOSIS — C7801 Secondary malignant neoplasm of right lung: Secondary | ICD-10-CM | POA: Diagnosis not present

## 2019-05-05 DIAGNOSIS — C50811 Malignant neoplasm of overlapping sites of right female breast: Secondary | ICD-10-CM | POA: Diagnosis not present

## 2019-05-05 DIAGNOSIS — J90 Pleural effusion, not elsewhere classified: Secondary | ICD-10-CM | POA: Diagnosis not present

## 2019-05-05 DIAGNOSIS — R0602 Shortness of breath: Secondary | ICD-10-CM | POA: Diagnosis not present

## 2019-05-05 DIAGNOSIS — D649 Anemia, unspecified: Secondary | ICD-10-CM | POA: Diagnosis not present

## 2019-05-05 NOTE — Progress Notes (Signed)
[place] Work   Psychologist, forensic spoke with patients daughter, [name] regarding oxygen concentrator.  [name] stated she spoke to a representative from [facility] and oxygen concentrator provider.  Both stated they needed information from the physician stating medical necessity before concentrator can be approved.  Based on information from patients daughter the letter patient received states the same message Medicaid rep provided.  Patient has an appointment with her PCP this week, and plans to request information needed to approve oxygen concentrator.  CSW encouraged patients daughter to contact CSW and medical oncologist if PCP is unable to fill request.    Johnnye Lana, MSW, LCSW, OSW-C Clinical Social Worker [place] [phone]

## 2019-05-06 ENCOUNTER — Ambulatory Visit: Payer: Medicaid Other | Attending: Family Medicine | Admitting: Family Medicine

## 2019-05-06 ENCOUNTER — Telehealth: Payer: Self-pay

## 2019-05-06 ENCOUNTER — Other Ambulatory Visit: Payer: Self-pay

## 2019-05-06 ENCOUNTER — Encounter: Payer: Self-pay | Admitting: Family Medicine

## 2019-05-06 DIAGNOSIS — Z170 Estrogen receptor positive status [ER+]: Secondary | ICD-10-CM | POA: Diagnosis not present

## 2019-05-06 DIAGNOSIS — C7801 Secondary malignant neoplasm of right lung: Secondary | ICD-10-CM | POA: Diagnosis not present

## 2019-05-06 DIAGNOSIS — J90 Pleural effusion, not elsewhere classified: Secondary | ICD-10-CM | POA: Diagnosis not present

## 2019-05-06 DIAGNOSIS — Z9981 Dependence on supplemental oxygen: Secondary | ICD-10-CM

## 2019-05-06 DIAGNOSIS — R0602 Shortness of breath: Secondary | ICD-10-CM | POA: Diagnosis not present

## 2019-05-06 DIAGNOSIS — F329 Major depressive disorder, single episode, unspecified: Secondary | ICD-10-CM | POA: Diagnosis not present

## 2019-05-06 DIAGNOSIS — C50811 Malignant neoplasm of overlapping sites of right female breast: Secondary | ICD-10-CM | POA: Diagnosis not present

## 2019-05-06 DIAGNOSIS — J910 Malignant pleural effusion: Secondary | ICD-10-CM | POA: Diagnosis not present

## 2019-05-06 DIAGNOSIS — I10 Essential (primary) hypertension: Secondary | ICD-10-CM

## 2019-05-06 DIAGNOSIS — D649 Anemia, unspecified: Secondary | ICD-10-CM | POA: Diagnosis not present

## 2019-05-06 DIAGNOSIS — J9611 Chronic respiratory failure with hypoxia: Secondary | ICD-10-CM

## 2019-05-06 DIAGNOSIS — J9612 Chronic respiratory failure with hypercapnia: Secondary | ICD-10-CM

## 2019-05-06 DIAGNOSIS — G893 Neoplasm related pain (acute) (chronic): Secondary | ICD-10-CM | POA: Diagnosis not present

## 2019-05-06 DIAGNOSIS — C7951 Secondary malignant neoplasm of bone: Secondary | ICD-10-CM | POA: Diagnosis not present

## 2019-05-06 NOTE — Telephone Encounter (Signed)
Patient's daughter, [name], called to update regarding the letter received from [facility].  [name] recently thought letter was in regards to oxygen concentrator denial, [name] was informed this is not related to the oxygen concentrator.  Per [name], the letter from [facility] is in regards to the denial for a wheelchair.  [name] informed us that the primary care physician's office I assisting with filing for an appeal.  Pt's daughter calling to give update.  RN will notify social work of change.

## 2019-05-06 NOTE — Progress Notes (Signed)
Patient verified DOB Patient has not taken medication today. Patient has not eaten today. Patient denies pain at this time.

## 2019-05-06 NOTE — Progress Notes (Signed)
Virtual Visit via Telephone Note  I connected with Suzanne [name] and her daughter on 05/06/19 at  9:10 AM EST by telephone and verified that I am speaking with the correct person using two identifiers.    I discussed the limitations, risks, security and privacy concerns of performing an evaluation and management service by telephone and the availability of in person appointments. I also discussed with the patient that there may be a patient responsible charge related to this service. The patient expressed understanding and agreed to proceed.  Patient Location: Home Provider Location: [facility] Office Others participating in call: patient's daughter, [name]   History of Present Illness:         58 yo female who has metastatic stage IV estrogen receptor positive breast cancer, who is seen in follow-up. Patient is using her oxygen continuously.  Per daughter they recently received a call from the healthcare company that patient's oxygen concentrator had been denied by insurance.  Daughter states that she then got the letter from [facility] which mention denial of DME and on review of letter during the phone call, daughter states that the letter actually states that patient's wheelchair has been declined/denied.  Patient per daughter can walk a few feet with the use of a quad cane in order to get to the restroom but is not able to walk for any further distances.  Patient has shortness of breath with exertion as well as increased pain.  (Patient has metastasis of breast cancer to her spine as well as malignant pleural effusion).           Home health has been seeing patient and she has a rash under the right arm which home health believes may be related to heat rash.  Daughter reports that patient's blood pressure is doing well on low-dose carvedilol which home health has told daughter that patient is on for heart failure however daughter does not recall a prior diagnosis of heart failure and thought that the  carvedilol was just for patient's hypertension.  Daughter would like to know if patient has or has had heart failure diagnosis.  Patient has upcoming appointment with orthopedics regarding a plate in the left arm.  Patient has been following up with oncology but only by phone.  Daughter hopes to take patient into the oncology office to have blood work done next week when patient has orthopedic appointment as well.            Patient reports that overall she feels well as her pain is controlled on the current medications.  She continues to have fatigue and decreased ability to ambulate due to weakness and onset of shortness of breath with activity.  She is wearing her oxygen continuously.  She reports that she has a good appetite and eats 3 small meals with snacks/supplements daily she denies any constipation related to her use of pain narcotic pain medication.  She tries to take Tylenol for less severe pain.  She denies any current issues with chest pain or palpitations, no urinary frequency or dysuria.  Patient and daughter report that blood pressure has been stable when checked by home health aides.  Both daughter and patient believes that daughter's mood is improved with the use of sertraline.  Patient reports that she feels less anxious and has not been tearful after being on the medicine for a few weeks.  Past Medical History:  Diagnosis Date   Cancer [facility])    Family history of breast cancer  Family history of colon cancer    Family history of melanoma    Family history of prostate cancer    Gallstones    Glaucoma    Mastitis    reports history of recurrent mastitis   Metastatic breast cancer ([place])    Obesity     Past Surgical History:  Procedure Laterality Date   GLAUCOMA SURGERY  late 1990's   IR THORACENTESIS ASP PLEURAL SPACE W/IMG GUIDE  10/28/2018   IR THORACENTESIS ASP PLEURAL SPACE W/IMG GUIDE  10/29/2018   OPEN REDUCTION INTERNAL FIXATION (ORIF) DISTAL RADIAL FRACTURE  Left 01/09/2019   Procedure: OPEN REDUCTION INTERNAL FIXATION (ORIF) HUMERAL FRACTURE;  Surgeon: Netta Cedars, MD;  Location: [facility];  Service: Orthopedics;  Laterality: Left;    Family History  Problem Relation Age of Onset   Breast cancer Sister        in her 76's-70s   Heart disease Brother        CABG   Heart disease Brother        CABG   Skin cancer Brother        melanoma   Colon cancer Cousin    Heart attack Mother    Heart attack Father    Prostate cancer Brother     Social History   Tobacco Use   Smoking status: Never Smoker   Smokeless tobacco: Never Used   Tobacco comment: second hand smoke exposure  Substance Use Topics   Alcohol use: No   Drug use: No     No Known Allergies     Observations/Objective: No vital signs or physical exam conducted as visit was done via telephone  Assessment and Plan: 1. Malignant neoplasm of overlapping sites of right breast in female, estrogen receptor positive ([place]) 2. Bone metastases ([place]); status post T12 vertebral burst fracture Patient's pain is being managed by hematology/oncology but unfortunately due to the COVID-19 pandemic, visits have mostly been by phone consult per patient/daughter.  Hopefully patient can be seen in person soon and care plan can be put in place.  I am not sure if oncology has discussed hospice with patient and her daughter as this may help with improvement in quality of life and additional resources to help daughter with the care of her mother.  3. Chronic respiratory failure with hypoxia and hypercapnia (HCC) 4. Malignant pleural effusion; dependence on continuous supplemental oxygen Patient with chronic respiratory failure with hypoxia and hypercapnia along with right malignant pleural effusion.  Patient is oxygen dependent.  Nurse care manager will contact daughter to try and assist with coordination of care with DME suppliers/oxygen supplier and insurance as daughter believes that she  received a letter denying coverage of patient's wheelchair but was also told that patient's oxygen concentrator had been denied but patient has not received a letter from her insurance to that effect.  5. Essential hypertension Patient's blood pressure and heart rate are currently stable on low-dose carvedilol.  Patient's daughter had questions regarding whether or not her mother had been diagnosed with heart failure as home health nurses had told her daughter that the carvedilol was for treatment of heart failure but daughter did not recall this diagnosis in the past.  On review of chart, patient did have diagnosis of diastolic heart failure during a prior hospitalization and this may also have been related to patient with prior severe anemia with hemoglobin of 4.5 as well as bilateral pleural effusions on hospitalization in May 2020.  Patient had transfusion of packed  red blood cells and thoracentesis.  6. Reactive depression Daughter reports that patient is doing well on sertraline which has helped with depression and mother would like to try a higher dose of the medication.  New prescription will be sent to pharmacy and current dose can be increased to 2 pills daily until new prescription needed.  Follow Up Instructions: 4 to 6 weeks and as needed    I discussed the assessment and treatment plan with the patient. The patient was provided an opportunity to ask questions and all were answered. The patient agreed with the plan and demonstrated an understanding of the instructions.   The patient was advised to call back or seek an in-person evaluation if the symptoms worsen or if the condition fails to improve as anticipated.  I provided 21 minutes of non-face-to-face time during this encounter.   Antony Blackbird, MD

## 2019-05-07 ENCOUNTER — Telehealth: Payer: Self-pay

## 2019-05-07 DIAGNOSIS — M84522D Pathological fracture in neoplastic disease, left humerus, subsequent encounter for fracture with routine healing: Secondary | ICD-10-CM | POA: Diagnosis not present

## 2019-05-07 DIAGNOSIS — Z9181 History of falling: Secondary | ICD-10-CM | POA: Diagnosis not present

## 2019-05-07 DIAGNOSIS — C7801 Secondary malignant neoplasm of right lung: Secondary | ICD-10-CM | POA: Diagnosis not present

## 2019-05-07 DIAGNOSIS — C7951 Secondary malignant neoplasm of bone: Secondary | ICD-10-CM | POA: Diagnosis not present

## 2019-05-07 DIAGNOSIS — C50811 Malignant neoplasm of overlapping sites of right female breast: Secondary | ICD-10-CM | POA: Diagnosis not present

## 2019-05-07 DIAGNOSIS — J9611 Chronic respiratory failure with hypoxia: Secondary | ICD-10-CM | POA: Diagnosis not present

## 2019-05-07 DIAGNOSIS — G893 Neoplasm related pain (acute) (chronic): Secondary | ICD-10-CM | POA: Diagnosis not present

## 2019-05-07 DIAGNOSIS — R0602 Shortness of breath: Secondary | ICD-10-CM | POA: Diagnosis not present

## 2019-05-07 DIAGNOSIS — I10 Essential (primary) hypertension: Secondary | ICD-10-CM | POA: Diagnosis not present

## 2019-05-07 DIAGNOSIS — Z170 Estrogen receptor positive status [ER+]: Secondary | ICD-10-CM | POA: Diagnosis not present

## 2019-05-07 DIAGNOSIS — D630 Anemia in neoplastic disease: Secondary | ICD-10-CM | POA: Diagnosis not present

## 2019-05-07 DIAGNOSIS — M810 Age-related osteoporosis without current pathological fracture: Secondary | ICD-10-CM | POA: Diagnosis not present

## 2019-05-07 DIAGNOSIS — J90 Pleural effusion, not elsewhere classified: Secondary | ICD-10-CM | POA: Diagnosis not present

## 2019-05-07 DIAGNOSIS — M8458XD Pathological fracture in neoplastic disease, other specified site, subsequent encounter for fracture with routine healing: Secondary | ICD-10-CM | POA: Diagnosis not present

## 2019-05-07 DIAGNOSIS — M47816 Spondylosis without myelopathy or radiculopathy, lumbar region: Secondary | ICD-10-CM | POA: Diagnosis not present

## 2019-05-07 DIAGNOSIS — K5900 Constipation, unspecified: Secondary | ICD-10-CM | POA: Diagnosis not present

## 2019-05-07 DIAGNOSIS — E669 Obesity, unspecified: Secondary | ICD-10-CM | POA: Diagnosis not present

## 2019-05-07 DIAGNOSIS — D649 Anemia, unspecified: Secondary | ICD-10-CM | POA: Diagnosis not present

## 2019-05-07 NOTE — Telephone Encounter (Signed)
Call placed to [employer] to check question of medicaid coverage for O2 and wheelchair. Spoke to [place] who said that patient has O2 through them since 11/2018 and it was set up through the hardship program she received a wheelchair in 01/2019 through charity.  She said that she has no information about a denial from medicaid about the DME and she suggested that the patient's daughter, call medicaid to obtain more information. She is not sure if the denial is related to any back billing.    Call placed to patient's daughter, Suzanne [name] regarding the DME and denials She said that her mother received 2 wheelchairs and the first time it was billed through [place]. She said that her mother's medicaid was effective 09/26/2018. Explained to her about the call with [facility] as noted above and recommended that she call Medicaid and asks that they fax any requests for more information to this clinic or to [employer].  She said she would call medicaid today or tomorrow.

## 2019-05-08 DIAGNOSIS — D649 Anemia, unspecified: Secondary | ICD-10-CM | POA: Diagnosis not present

## 2019-05-08 DIAGNOSIS — C7951 Secondary malignant neoplasm of bone: Secondary | ICD-10-CM | POA: Diagnosis not present

## 2019-05-08 DIAGNOSIS — C50811 Malignant neoplasm of overlapping sites of right female breast: Secondary | ICD-10-CM | POA: Diagnosis not present

## 2019-05-08 DIAGNOSIS — J90 Pleural effusion, not elsewhere classified: Secondary | ICD-10-CM | POA: Diagnosis not present

## 2019-05-08 DIAGNOSIS — C7801 Secondary malignant neoplasm of right lung: Secondary | ICD-10-CM | POA: Diagnosis not present

## 2019-05-08 DIAGNOSIS — R0602 Shortness of breath: Secondary | ICD-10-CM | POA: Diagnosis not present

## 2019-05-08 DIAGNOSIS — G893 Neoplasm related pain (acute) (chronic): Secondary | ICD-10-CM | POA: Diagnosis not present

## 2019-05-10 ENCOUNTER — Encounter: Payer: Self-pay | Admitting: Family Medicine

## 2019-05-10 MED ORDER — SERTRALINE HCL 50 MG PO TABS: ORAL_TABLET | ORAL | 4 refills | Status: DC

## 2019-05-11 ENCOUNTER — Telehealth: Payer: Self-pay | Admitting: *Deleted

## 2019-05-11 DIAGNOSIS — J90 Pleural effusion, not elsewhere classified: Secondary | ICD-10-CM | POA: Diagnosis not present

## 2019-05-11 DIAGNOSIS — D649 Anemia, unspecified: Secondary | ICD-10-CM | POA: Diagnosis not present

## 2019-05-11 DIAGNOSIS — C50811 Malignant neoplasm of overlapping sites of right female breast: Secondary | ICD-10-CM | POA: Diagnosis not present

## 2019-05-11 DIAGNOSIS — R0602 Shortness of breath: Secondary | ICD-10-CM | POA: Diagnosis not present

## 2019-05-11 DIAGNOSIS — C7801 Secondary malignant neoplasm of right lung: Secondary | ICD-10-CM | POA: Diagnosis not present

## 2019-05-11 DIAGNOSIS — G893 Neoplasm related pain (acute) (chronic): Secondary | ICD-10-CM | POA: Diagnosis not present

## 2019-05-11 DIAGNOSIS — C7951 Secondary malignant neoplasm of bone: Secondary | ICD-10-CM | POA: Diagnosis not present

## 2019-05-11 NOTE — Telephone Encounter (Signed)
This RN spoke with pt's daughter Suzanne [name] per her call stating she is concerned due to " getting the run around with my mom's equipment provider "  She states she received a notice that the oxygen concentrator is being denied per Medicaid after January 4,2020.  She has called [employer] multiple times- and has left VMs for a return call since last Thursday. She has not received a return call.  She called another number she was given for [employer] and was routed to billing - who then placed her on hold for a lengthy period that she had to disconnect to care for her mother.  Suzanne [name] states she " feels exhausted and I am just trying to stay on top of this "  Suzanne [name] stated post leaving a VM on nurse line that she also communicated with Social Work staff at this office.  This RN informed her - communication will be documented and sent to social workers for any further assistance they or Dr Jana Hakim can offer in this situation.  No further needs at this time.

## 2019-05-11 NOTE — Telephone Encounter (Signed)
I think they were helping too - see note from case manager but I do not see where they are calling or helping her process the system-.  If you could call it would be helpful - maybe- Val

## 2019-05-11 NOTE — Telephone Encounter (Signed)
Call received from patient's daughter, [name].  She explained that she received a message from [employer] and has been trying to get through them but has not been successful.  Provided her with # [phone] to try.  She had another number. She also noted that she was waiting to speak to [employer] prior to calling Medicaid

## 2019-05-12 ENCOUNTER — Encounter: Payer: Self-pay | Admitting: *Deleted

## 2019-05-12 ENCOUNTER — Telehealth: Payer: Self-pay

## 2019-05-12 NOTE — Telephone Encounter (Signed)
Call received from patient's daughter, [name]. She explained that she spoke to [place] at [place] # [phone] x [phone] and she needs paperwork from this clinic.   Call placed to Joanne/[employer] at the above noted number, message left requesting a call back to this CM

## 2019-05-12 NOTE — Progress Notes (Signed)
[place] Clinical Social Work  [facility] representative contacted patients daughter, [name] at home after receiving message from [employer].  [name] stated RN case manager at PCP office had contacted [employer] regarding coverage for O2 and wheelchair.  According to [employer] and [name] the wheelchair and O2 were set up through the hardship/charity program.  [name] stated she has not received any written notice about a denial for the O2, just a phone call stating they needed information from the doctor.   [name] states she has left 2 messages for contacts at [facility] and will call [facility] office once she receives return call.  CSW has reached out to RN case manager to discuss any additional support needed.  Johnnye Lana, MSW, LCSW, OSW-C Clinical Social Worker [facility] [phone]

## 2019-05-13 DIAGNOSIS — Z9181 History of falling: Secondary | ICD-10-CM | POA: Diagnosis not present

## 2019-05-13 DIAGNOSIS — M810 Age-related osteoporosis without current pathological fracture: Secondary | ICD-10-CM | POA: Diagnosis not present

## 2019-05-13 DIAGNOSIS — K5900 Constipation, unspecified: Secondary | ICD-10-CM | POA: Diagnosis not present

## 2019-05-13 DIAGNOSIS — Z170 Estrogen receptor positive status [ER+]: Secondary | ICD-10-CM | POA: Diagnosis not present

## 2019-05-13 DIAGNOSIS — M84522D Pathological fracture in neoplastic disease, left humerus, subsequent encounter for fracture with routine healing: Secondary | ICD-10-CM | POA: Diagnosis not present

## 2019-05-13 DIAGNOSIS — J9611 Chronic respiratory failure with hypoxia: Secondary | ICD-10-CM | POA: Diagnosis not present

## 2019-05-13 DIAGNOSIS — C7951 Secondary malignant neoplasm of bone: Secondary | ICD-10-CM | POA: Diagnosis not present

## 2019-05-13 DIAGNOSIS — C7801 Secondary malignant neoplasm of right lung: Secondary | ICD-10-CM | POA: Diagnosis not present

## 2019-05-13 DIAGNOSIS — C50811 Malignant neoplasm of overlapping sites of right female breast: Secondary | ICD-10-CM | POA: Diagnosis not present

## 2019-05-13 DIAGNOSIS — I10 Essential (primary) hypertension: Secondary | ICD-10-CM | POA: Diagnosis not present

## 2019-05-13 DIAGNOSIS — E669 Obesity, unspecified: Secondary | ICD-10-CM | POA: Diagnosis not present

## 2019-05-13 DIAGNOSIS — M47816 Spondylosis without myelopathy or radiculopathy, lumbar region: Secondary | ICD-10-CM | POA: Diagnosis not present

## 2019-05-13 DIAGNOSIS — G893 Neoplasm related pain (acute) (chronic): Secondary | ICD-10-CM | POA: Diagnosis not present

## 2019-05-13 DIAGNOSIS — D630 Anemia in neoplastic disease: Secondary | ICD-10-CM | POA: Diagnosis not present

## 2019-05-13 DIAGNOSIS — M8458XD Pathological fracture in neoplastic disease, other specified site, subsequent encounter for fracture with routine healing: Secondary | ICD-10-CM | POA: Diagnosis not present

## 2019-05-13 NOTE — Telephone Encounter (Signed)
Message received from [place] requesting fax number to request additional information. She also needed the date patient was last seen by provider.  Call back # [phone].  Call returned to [place].  Left CM call back # as well as [facility] fax number and date of last appt with PCP.

## 2019-05-14 DIAGNOSIS — C7951 Secondary malignant neoplasm of bone: Secondary | ICD-10-CM | POA: Diagnosis not present

## 2019-05-14 DIAGNOSIS — D649 Anemia, unspecified: Secondary | ICD-10-CM | POA: Diagnosis not present

## 2019-05-14 DIAGNOSIS — C50811 Malignant neoplasm of overlapping sites of right female breast: Secondary | ICD-10-CM | POA: Diagnosis not present

## 2019-05-14 DIAGNOSIS — G893 Neoplasm related pain (acute) (chronic): Secondary | ICD-10-CM | POA: Diagnosis not present

## 2019-05-14 DIAGNOSIS — J90 Pleural effusion, not elsewhere classified: Secondary | ICD-10-CM | POA: Diagnosis not present

## 2019-05-14 DIAGNOSIS — C7801 Secondary malignant neoplasm of right lung: Secondary | ICD-10-CM | POA: Diagnosis not present

## 2019-05-14 DIAGNOSIS — R0602 Shortness of breath: Secondary | ICD-10-CM | POA: Diagnosis not present

## 2019-05-14 DIAGNOSIS — Z5189 Encounter for other specified aftercare: Secondary | ICD-10-CM | POA: Diagnosis not present

## 2019-05-15 DIAGNOSIS — Z9181 History of falling: Secondary | ICD-10-CM | POA: Diagnosis not present

## 2019-05-15 DIAGNOSIS — M8458XD Pathological fracture in neoplastic disease, other specified site, subsequent encounter for fracture with routine healing: Secondary | ICD-10-CM | POA: Diagnosis not present

## 2019-05-15 DIAGNOSIS — R0602 Shortness of breath: Secondary | ICD-10-CM | POA: Diagnosis not present

## 2019-05-15 DIAGNOSIS — M810 Age-related osteoporosis without current pathological fracture: Secondary | ICD-10-CM | POA: Diagnosis not present

## 2019-05-15 DIAGNOSIS — M47816 Spondylosis without myelopathy or radiculopathy, lumbar region: Secondary | ICD-10-CM | POA: Diagnosis not present

## 2019-05-15 DIAGNOSIS — G893 Neoplasm related pain (acute) (chronic): Secondary | ICD-10-CM | POA: Diagnosis not present

## 2019-05-15 DIAGNOSIS — J9611 Chronic respiratory failure with hypoxia: Secondary | ICD-10-CM | POA: Diagnosis not present

## 2019-05-15 DIAGNOSIS — K5900 Constipation, unspecified: Secondary | ICD-10-CM | POA: Diagnosis not present

## 2019-05-15 DIAGNOSIS — C50811 Malignant neoplasm of overlapping sites of right female breast: Secondary | ICD-10-CM | POA: Diagnosis not present

## 2019-05-15 DIAGNOSIS — M84522D Pathological fracture in neoplastic disease, left humerus, subsequent encounter for fracture with routine healing: Secondary | ICD-10-CM | POA: Diagnosis not present

## 2019-05-15 DIAGNOSIS — C7951 Secondary malignant neoplasm of bone: Secondary | ICD-10-CM | POA: Diagnosis not present

## 2019-05-15 DIAGNOSIS — D649 Anemia, unspecified: Secondary | ICD-10-CM | POA: Diagnosis not present

## 2019-05-15 DIAGNOSIS — J90 Pleural effusion, not elsewhere classified: Secondary | ICD-10-CM | POA: Diagnosis not present

## 2019-05-15 DIAGNOSIS — Z170 Estrogen receptor positive status [ER+]: Secondary | ICD-10-CM | POA: Diagnosis not present

## 2019-05-15 DIAGNOSIS — E669 Obesity, unspecified: Secondary | ICD-10-CM | POA: Diagnosis not present

## 2019-05-15 DIAGNOSIS — D630 Anemia in neoplastic disease: Secondary | ICD-10-CM | POA: Diagnosis not present

## 2019-05-15 DIAGNOSIS — C7801 Secondary malignant neoplasm of right lung: Secondary | ICD-10-CM | POA: Diagnosis not present

## 2019-05-15 DIAGNOSIS — I10 Essential (primary) hypertension: Secondary | ICD-10-CM | POA: Diagnosis not present

## 2019-05-18 ENCOUNTER — Other Ambulatory Visit: Payer: Self-pay

## 2019-05-18 ENCOUNTER — Inpatient Hospital Stay: Payer: Medicaid Other | Attending: Adult Health

## 2019-05-18 ENCOUNTER — Encounter: Payer: Self-pay | Admitting: Adult Health

## 2019-05-18 ENCOUNTER — Telehealth: Payer: Self-pay | Admitting: Oncology

## 2019-05-18 ENCOUNTER — Inpatient Hospital Stay (HOSPITAL_BASED_OUTPATIENT_CLINIC_OR_DEPARTMENT_OTHER): Payer: Medicaid Other | Admitting: Adult Health

## 2019-05-18 VITALS — BP 130/65 | HR 73 | Temp 98.3°F | Resp 18 | Ht 63.0 in

## 2019-05-18 DIAGNOSIS — C7800 Secondary malignant neoplasm of unspecified lung: Secondary | ICD-10-CM

## 2019-05-18 DIAGNOSIS — J90 Pleural effusion, not elsewhere classified: Secondary | ICD-10-CM

## 2019-05-18 DIAGNOSIS — C50811 Malignant neoplasm of overlapping sites of right female breast: Secondary | ICD-10-CM | POA: Diagnosis not present

## 2019-05-18 DIAGNOSIS — C7951 Secondary malignant neoplasm of bone: Secondary | ICD-10-CM | POA: Insufficient documentation

## 2019-05-18 DIAGNOSIS — D649 Anemia, unspecified: Secondary | ICD-10-CM

## 2019-05-18 DIAGNOSIS — R0602 Shortness of breath: Secondary | ICD-10-CM

## 2019-05-18 DIAGNOSIS — C7801 Secondary malignant neoplasm of right lung: Secondary | ICD-10-CM

## 2019-05-18 DIAGNOSIS — Z170 Estrogen receptor positive status [ER+]: Secondary | ICD-10-CM | POA: Diagnosis not present

## 2019-05-18 DIAGNOSIS — C50911 Malignant neoplasm of unspecified site of right female breast: Secondary | ICD-10-CM | POA: Insufficient documentation

## 2019-05-18 DIAGNOSIS — Z6841 Body Mass Index (BMI) 40.0 and over, adult: Secondary | ICD-10-CM

## 2019-05-18 DIAGNOSIS — G893 Neoplasm related pain (acute) (chronic): Secondary | ICD-10-CM

## 2019-05-18 LAB — COMPREHENSIVE METABOLIC PANEL
ALT: 9 U/L (ref 0–44)
AST: 16 U/L (ref 15–41)
Albumin: 3.5 g/dL (ref 3.5–5.0)
Alkaline Phosphatase: 141 U/L — ABNORMAL HIGH (ref 38–126)
Anion gap: 11 (ref 5–15)
BUN: 18 mg/dL (ref 6–20)
CO2: 33 mmol/L — ABNORMAL HIGH (ref 22–32)
Calcium: 9.4 mg/dL (ref 8.9–10.3)
Chloride: 98 mmol/L (ref 98–111)
Creatinine, Ser: 1.07 mg/dL — ABNORMAL HIGH (ref 0.44–1.00)
GFR calc Af Amer: >60 mL/min (ref >60)
GFR calc non Af Amer: 56 mL/min — ABNORMAL LOW (ref >60)
Glucose, Bld: 110 mg/dL — ABNORMAL HIGH (ref 70–99)
Potassium: 3.5 mmol/L (ref 3.5–5.1)
Sodium: 142 mmol/L (ref 135–145)
Total Bilirubin: 0.4 mg/dL (ref 0.3–1.2)
Total Protein: 7.5 g/dL (ref 6.5–8.1)

## 2019-05-18 LAB — CBC WITH DIFFERENTIAL/PLATELET
Abs Immature Granulocytes: 0.01 10*3/uL (ref 0.00–0.07)
Basophils Absolute: 0.1 10*3/uL (ref 0.0–0.1)
Basophils Relative: 2 %
Eosinophils Absolute: 0.1 10*3/uL (ref 0.0–0.5)
Eosinophils Relative: 2 %
HCT: 30.6 % — ABNORMAL LOW (ref 36.0–46.0)
Hemoglobin: 9.9 g/dL — ABNORMAL LOW (ref 12.0–15.0)
Immature Granulocytes: 0 %
Lymphocytes Relative: 37 %
Lymphs Abs: 1.5 10*3/uL (ref 0.7–4.0)
MCH: 34.6 pg — ABNORMAL HIGH (ref 26.0–34.0)
MCHC: 32.4 g/dL (ref 30.0–36.0)
MCV: 107 fL — ABNORMAL HIGH (ref 80.0–100.0)
Monocytes Absolute: 0.2 10*3/uL (ref 0.1–1.0)
Monocytes Relative: 6 %
Neutro Abs: 2 10*3/uL (ref 1.7–7.7)
Neutrophils Relative %: 53 %
Platelets: 419 10*3/uL — ABNORMAL HIGH (ref 150–400)
RBC: 2.86 MIL/uL — ABNORMAL LOW (ref 3.87–5.11)
RDW: 15.6 % — ABNORMAL HIGH (ref 11.5–15.5)
WBC: 3.9 10*3/uL — ABNORMAL LOW (ref 4.0–10.5)
nRBC: 0 % (ref 0.0–0.2)

## 2019-05-18 MED ORDER — DENOSUMAB 120 MG/1.7ML ~~LOC~~ SOLN
SUBCUTANEOUS | Status: AC
  Filled 2019-05-18: qty 1.7

## 2019-05-18 MED ORDER — DENOSUMAB 120 MG/1.7ML ~~LOC~~ SOLN
120.0000 mg | Freq: Once | SUBCUTANEOUS | Status: AC
  Administered 2019-05-18: 120 mg via SUBCUTANEOUS

## 2019-05-18 NOTE — Telephone Encounter (Signed)
I left a message regarding schedule  

## 2019-05-18 NOTE — Progress Notes (Signed)
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 She was brought with [facility] transportation and her daughter Suzanne [name].    She continues on anastrozole with good tolerance.  She notes tolerable hot flashes and hair growth on her upper inner thighs from this.    She is also taking Palbociclib with good tolerance.  Her current dose is 100 mg daily taking for 3 weeks and then off for 1 week and then repeat.  She is currently at the end of her second week on Palbociclib.  She tolerates this well.  She receives Suzanne [name] every 4 weeks and her last dose was on 02/19/2019.    REVIEW OF SYSTEMS: Suzanne [name] is doing well today.  She was discharged from rehab on 03/09/2019 and has been doing well at home.  She is in a wheelchair and accompanied by her daughter Suzanne [name].  She notes that she is due to start PT tomorrow for her arm and speech therapy for her voice hoarseness.  She may need to see ENT, however will follow up with SLP in January to make that determination.    Suzanne [name] continues on oxygen, but notes her breathing is improved.  She does take occasional deep breaths, but has no shortness of breath, chest pain, palpitations, cough, or any other concerns.  Suzanne [name] describes her mom's appetite as good, and she is also working on her fluid intake.  She is having regular bowel movements, and is walking and standing at home with her walker.  She is not in any pain.  A detailed ROS was otherwise non  contributory today.   HISTORY OF CURRENT ILLNESS: From the original inpatient consult note:  Suzanne [name] is a 58 year old female from [place], [place] without significant past medical history with exception of obesity, remote history of gallstones, and remote history of mastitis.  The patient presented to the hospital on 10/26/2018 with a 3-week history of worsening shortness of breath and lower extremity edema.  The patient reported that her shortness of breath worsened with exertion.  Her lower extremity edema did not improve with elevation.  She also had an intermittent pr oductive cough with rare sputum production.  The patient was noted to be anemic with a hemoglobin of 4.5 with a clumped platelets noted on admission.  Stool for occult blood was positive.  Patient received 2 units of packed red blood cells.    The patient reported a rash under her breast x1 week which has been foul-smelling.  She also reported that she has some discomfort and thickening/hardness of her right breast for several months.  She states that her right breast is much larger than her left.  She notes some occasional bloody discharge from her breast once in a while.  A chest x-ray on admission showed diffuse bilateral interstitial opacity of unknown chronicity, findings could be related to interstitial inflammatory process, atypical/viral pneumonia, or possible metastatic disease.  There are also sclerotic and lytic lesions within the clavicles, bilateral ribs, shoulders which was concerning for diffuse few skeletal metastatic disease.    This  prompted a CT scan of the chest with contrast which showed bilateral axillary and mediastinal adenopathy, borderline hilar lymph nodes bilaterally, findings likely flecked metastatic lymphadenopathy, moderate bilateral pleural effusions, extensive interstitial thickening and reticulonodular opacities throughout the lungs, cannot exclude lymphangitic spread of tumor, diffuse  skeletal metastases.  The patient's subsequent history is as detailed below.   PAST MEDICAL HISTORY: Past Medical History:  Diagnosis Date  . Cancer ([place])   . Family history of breast cancer   . Family history of colon cancer   . Family history of melanoma   . Family history of prostate cancer   . Gallstones   . Glaucoma   . Mastitis    reports history of recurrent mastitis  . Metastatic breast cancer ([place])   . Obesity     PAST SURGICAL HISTORY: Past Surgical History:  Procedure Laterality Date  . GLAUCOMA SURGERY  late 1990's  . IR THORACENTESIS ASP PLEURAL SPACE W/IMG GUIDE  10/28/2018  . IR THORACENTESIS ASP PLEURAL SPACE W/IMG GUIDE  10/29/2018  . OPEN REDUCTION INTERNAL FIXATION (ORIF) DISTAL RADIAL FRACTURE Left 01/09/2019   Procedure: OPEN REDUCTION INTERNAL FIXATION (ORIF) HUMERAL FRACTURE;  Surgeon: Netta Cedars, MD;  Location: [facility];  Service: Orthopedics;  Laterality: Left;    FAMILY HISTORY: Family History  Problem Relation Age of Onset  . Breast cancer Sister        in her 81's-70s  . Heart disease Brother        CABG  . Heart disease Brother        CABG  . Skin cancer Brother        melanoma  . Colon cancer Cousin   . Heart attack Mother   . Heart attack Father   . Prostate cancer Brother   Patient's father passed away at age 55, and her mother at 50, both from heart attacks. The patient has 8 siblings, 6 brothers and 1 sister.  Her sister was diagnosed with breast cancer. One brother has prostate cancer, another brother had a melanoma removed.    GYNECOLOGIC HISTORY:  Menarche: 58 years old Age at first live birth: 58 years old [place] P 1 LMP age 59 Contraceptive: used for 2-3 years with no problems HRT never used  Hysterectomy? 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 She is widowed. She lives at home with her daughter. Daughter Suzanne [name], age 8, is a Psychologist, occupational at the science center.  The patient has no grandchildren.  She is not a Ambulance person.   ADVANCED DIRECTIVES: not on file; she has the paperwork already. She intends to name her daughter as her HCPOA.   HEALTH MAINTENANCE: Social History   Tobacco Use  . Smoking status: Never Smoker  . Smokeless tobacco: Never Used  . Tobacco comment: second hand smoke exposure  Substance Use Topics  . Alcohol use: No  . Drug use: No    Colonoscopy:   PAP:   Bone density:  Mammography:   No Known Allergies  Current Outpatient Medications  Medication Sig Dispense Refill  . albuterol (VENTOLIN HFA) 108 (90 Base) MCG/ACT inhaler Inhale 2 puffs into the lungs every 6 (six) hours as needed for wheezing or shortness of breath. 16 g 3  . ALPRAZolam (XANAX) 0.25 MG tablet Take 1-2 tablets (0.25-0.5 mg total) by mouth every 8 (eight) hours as needed for anxiety. Prescription needed for SNF placement 5 tablet 0  . anastrozole (ARIMIDEX) 1 MG tablet Take 1 tablet (1  mg total) by mouth daily. 30 tablet 3  . antiseptic oral rinse (BIOTENE) LIQD 15 mLs by Mouth Rinse route as needed for dry mouth.    . benzonatate (TESSALON) 200 MG capsule TAKE 1 CAPSULE BY MOUTH 3 TIMES DAILY IF NEEDED 30 capsule 0  . calcium-vitamin D (OSCAL WITH D) 500-200 MG-UNIT tablet Take 1 tablet by mouth 3 (three) times daily. 90 tablet 1  . furosemide (LASIX) 20 MG tablet Take 2 tablets (40 mg total) by mouth 2 (two) times daily. 120 tablet 4  . loratadine (CLARITIN) 10 MG tablet Take 1 tablet (10 mg total) by mouth daily. 30 tablet 3  . nystatin (MYCOSTATIN/NYSTOP) powder Apply topically 3 (three) times daily. Apply under breast area 15 g 1  . palbociclib (IBRANCE) 100 MG capsule Take 1 capsule (100 mg total) by mouth daily with breakfast. Take whole with food. Take for 21 days on, 7 days off, repeat every 28 days. 21 capsule 0  . polyethylene glycol (MIRALAX / GLYCOLAX) 17 g packet Take 17 g by mouth as needed for constipation.    . senna (SENOKOT) 8.6 MG TABS tablet Take 1 tablet (8.6 mg  total) by mouth 2 (two) times daily. 120 tablet 0  . sertraline (ZOLOFT) 50 MG tablet Two pills once per day at bedtime 60 tablet 4  . carvedilol (COREG) 3.125 MG tablet Take 1 tablet (3.125 mg total) by mouth 2 (two) times daily with a meal. 60 tablet 0   No current facility-administered medications for this visit.     OBJECTIVE: Middle-aged white woman examined in a wheelchair  Vitals:   05/18/19 1347  BP: 130/65  Pulse: 73  Resp: 18  Temp: 98.3 F (36.8 C)  SpO2: 100%     Body mass index is 29.6 kg/m.   Wt Readings from Last 3 Encounters:  02/06/19 167 lb 1.7 oz (75.8 kg)  01/22/19 182 lb 12.2 oz (82.9 kg)  12/01/18 191 lb 1.6 oz (86.7 kg)  ECOG FS:3 GENERAL: Patient appears  Comfortable, in no apparent distress. HEENT:  Sclerae anicteric. Mask in place Neck is supple.  NODES:  No cervical, supraclavicular, or axillary lymphadenopathy palpated.  BREAST EXAM:  Deferred. LUNGS:  Clear to auscultation bilaterally.  No wheezes or rhonchi. HEART:  Regular rate and rhythm. No murmur appreciated. ABDOMEN:  Soft, nontender.  Positive, normoactive bowel sounds. No organomegaly palpated. MSK:  No focal spinal tenderness to palpation. Full range of motion bilaterally in the upper extremities. EXTREMITIES:  No peripheral edema.   SKIN:  Clear with no obvious rashes or skin changes. No nail dyscrasia. NEURO:  Nonfocal. Well oriented.  Appropriate affect.       LAB RESULTS:  CMP     Component Value Date/Time   NA 142 05/18/2019 1226   K 3.5 05/18/2019 1226   CL 98 05/18/2019 1226   CO2 33 (H) 05/18/2019 1226   GLUCOSE 110 (H) 05/18/2019 1226   BUN 18 05/18/2019 1226   CREATININE 1.07 (H) 05/18/2019 1226   CREATININE 0.63 12/22/2018 1219   CALCIUM 9.4 05/18/2019 1226   PROT 7.5 05/18/2019 1226   ALBUMIN 3.5 05/18/2019 1226   AST 16 05/18/2019 1226   AST 61 (H) 12/22/2018 1219   ALT 9 05/18/2019 1226   ALT 51 (H) 12/22/2018 1219   ALKPHOS 141 (H) 05/18/2019 1226    BILITOT 0.4 05/18/2019 1226   BILITOT 0.5 12/22/2018 1219   GFRNONAA 56 (L) 05/18/2019 1226   GFRNONAA >60 12/22/2018 1219  GFRAA >60 05/18/2019 1226   GFRAA >60 12/22/2018 1219    No results found for: TOTALPROTELP, ALBUMINELP, A1GS, A2GS, BETS, BETA2SER, GAMS, MSPIKE, SPEI  No results found for: KPAFRELGTCHN, LAMBDASER, [facility]  Lab Results  Component Value Date   WBC 3.9 (L) 05/18/2019   NEUTROABS 2.0 05/18/2019   HGB 9.9 (L) 05/18/2019   HCT 30.6 (L) 05/18/2019   MCV 107.0 (H) 05/18/2019   PLT 419 (H) 05/18/2019    _0 @  No results found for: LABCA2  No components found for: [id number]  No results for input(s): INR in the last 168 hours.  No results found for: LABCA2  No results found for: CAN199  No results found for: BBU037  No results found for: QDU438  Lab Results  Component Value Date   CA2729 108.1 (H) 01/29/2019    No components found for: HGQUANT  No results found for: CEA1 / No results found for: CEA1   No results found for: AFPTUMOR  No results found for: [place]  No results found for: PSA1  Appointment on 05/18/2019  Component Date Value Ref Range Status  . WBC 05/18/2019 3.9* 4.0 - 10.5 K/uL Final  . RBC 05/18/2019 2.86* 3.87 - 5.11 MIL/uL Final  . Hemoglobin 05/18/2019 9.9* 12.0 - 15.0 g/dL Final  . HCT 05/18/2019 30.6* 36.0 - 46.0 % Final  . MCV 05/18/2019 107.0* 80.0 - 100.0 fL Final  . MCH 05/18/2019 34.6* 26.0 - 34.0 pg Final  . MCHC 05/18/2019 32.4  30.0 - 36.0 g/dL Final  . RDW 05/18/2019 15.6* 11.5 - 15.5 % Final  . Platelets 05/18/2019 419* 150 - 400 K/uL Final  . nRBC 05/18/2019 0.0  0.0 - 0.2 % Final  . Neutrophils Relative % 05/18/2019 53  % Final  . Neutro Abs 05/18/2019 2.0  1.7 - 7.7 K/uL Final  . Lymphocytes Relative 05/18/2019 37  % Final  . Lymphs Abs 05/18/2019 1.5  0.7 - 4.0 K/uL Final  . Monocytes Relative 05/18/2019 6  % Final  . Monocytes Absolute 05/18/2019 0.2  0.1 - 1.0 K/uL Final  .  Eosinophils Relative 05/18/2019 2  % Final  . Eosinophils Absolute 05/18/2019 0.1  0.0 - 0.5 K/uL Final  . Basophils Relative 05/18/2019 2  % Final  . Basophils Absolute 05/18/2019 0.1  0.0 - 0.1 K/uL Final  . Immature Granulocytes 05/18/2019 0  % Final  . Abs Immature Granulocytes 05/18/2019 0.01  0.00 - 0.07 K/uL Final   Performed at [facility], [place] [address], [place], [place] [address]  . Sodium 05/18/2019 142  135 - 145 mmol/L Final  . Potassium 05/18/2019 3.5  3.5 - 5.1 mmol/L Final  . Chloride 05/18/2019 98  98 - 111 mmol/L Final  . CO2 05/18/2019 33* 22 - 32 mmol/L Final  . Glucose, Bld 05/18/2019 110* 70 - 99 mg/dL Final  . BUN 05/18/2019 18  6 - 20 mg/dL Final  . Creatinine, Ser 05/18/2019 1.07* 0.44 - 1.00 mg/dL Final  . Calcium 05/18/2019 9.4  8.9 - 10.3 mg/dL Final  . Total Protein 05/18/2019 7.5  6.5 - 8.1 g/dL Final  . Albumin 05/18/2019 3.5  3.5 - 5.0 g/dL Final  . AST 05/18/2019 16  15 - 41 U/L Final  . ALT 05/18/2019 9  0 - 44 U/L Final  . Alkaline Phosphatase 05/18/2019 141* 38 - 126 U/L Final  . Total Bilirubin 05/18/2019 0.4  0.3 - 1.2 mg/dL Final  . GFR calc non Af Amer 05/18/2019  56* >60 mL/min Final  . GFR calc Af Amer 05/18/2019 >60  >60 mL/min Final  . Anion gap 05/18/2019 11  5 - 15 Final   Performed at [facility], [place] [address] [place], [place] [address]    (this displays the last labs from the last 3 days)  No results found for: TOTALPROTELP, ALBUMINELP, A1GS, A2GS, BETS, BETA2SER, GAMS, MSPIKE, SPEI (this displays SPEP labs)  No results found for: KPAFRELGTCHN, LAMBDASER, KAPLAMBRATIO (kappa/lambda light chains)  No results found for: HGBA, HGBA2QUANT, HGBFQUANT, HGBSQUAN (Hemoglobinopathy evaluation)   Lab Results  Component Value Date   LDH 244 (H) 12/25/2018    Lab Results  Component Value Date   IRON 137 10/26/2018   TIBC 384 10/26/2018   IRONPCTSAT 36 (H) 10/26/2018   (Iron and  TIBC)  Lab Results  Component Value Date   FERRITIN 2,306 (H) 12/25/2018    Urinalysis    Component Value Date/Time   COLORURINE STRAW (A) 01/15/2019 1150   APPEARANCEUR CLEAR 01/15/2019 1150   LABSPEC 1.008 01/15/2019 1150   PHURINE 6.0 01/15/2019 [place] 01/15/2019 [place] 01/15/2019 [place] 01/15/2019 [place] 01/15/2019 1150   PROTEINUR NEGATIVE 01/15/2019 1150   NITRITE NEGATIVE 01/15/2019 1150   LEUKOCYTESUR TRACE (A) 01/15/2019 1150     STUDIES:  No results found.    ELIGIBLE FOR AVAILABLE RESEARCH PROTOCOL: no   ASSESSMENT: 58 y.o. [place], [place] woman presenting 10/26/2018 with right-sided inflammatory breast cancer, stage IV, involvnh lungs, lymph nodes and bones, as follows:  (a) chest CT scan 10/26/2018 shows bilateral pleural effusions, possible lymphangitic spread of tumor, diffuse bony metastatic disease, and significant axillary mediastinal and hilar adenopathy  (b) bone scan 10/27/2018 is a "near [place]" consistent with widespread bony metastatic disease  (c) head CT with and without contrast 10/30/2018 shows no intracranial metastatic disease, multiple calvarial lesions  (d) CA-27-29 on 10/27/2018 was 1810.4  (1) pleural fluid from right thoracentesis 10/28/2018 confirms malignant cells consistent with a breast primary, strongly estrogen and progesterone receptor positive, HER-2 not amplified, with an MIB-1 of 2%  (2) anastrozole started 10/29/2018  (a) palbociclib to start 11/13/2018 at 100 mg dose  (b) denosumab/xgeva started 11/13/2018   (3) associated problems:  (a) hypoxia secondary to effusions  (b) pain from bone lesions and left humeral and vertebral compression fractures  (c) right upper extremity lymphedema  (d) anemia, requiring transfusion  (e) poor venous access  (4) genetics testing on 12/25/2018 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 STK11, TP53, TSC1, TSC2, and VHL.  The following genes were evaluated for sequence changes only: SDHA and HOXB13 c.251G>A variant only. The [facility] Melanoma Panel analyzed the following 9 genes: BAP1 BRCA2 CDK4 CDKN2A MITF POT1 PTEN RB1 Tp53.     PLAN: Suzanne [name] appears quite well and improved from prior.  I met with her and her daughter Suzanne [name] today.  Clinically there are no signs of cancer progression.  Her labs are stable, and she will continue treatment with  the Anastrozole, Palbociclib and Xgeva.  We will give her the Xgeva today.  She was recommended to take Tums today for extra calcium.  Suzanne [name] is regaining her strength.  I applauded her at working on her recovery and her strength level.  She was recommended to work with PT as they recommend.  She is not having any pain, but knows to call us if she has any new pain that occurs.    I reviewed how Suzanne [name] is doing with Dr. Jana Hakim.  He is delighted to hear that she is doing well.  She will continue her current treatment, return in 4 weeks for labs and an injection, and in 8 weeks for labs, f/u with Dr. Jana Hakim, and an injection.  She will undergo CT chest prior to the appointment with Dr. Jana Hakim, and orders were placed for that today.  I reviewed the above plan with Suzanne [name] and her daughter Suzanne [name] who verbalized understanding.  She was recommended to continue with the appropriate pandemic precautions.She knows to call for any questions that may  arise between now and her next appointment.  We are happy to see her sooner if needed.  A total of (25) minutes of face-to-face time was spent with this patient with greater than 50% of that time in counseling and care-coordination.   Wilber Bihari, NP Medical Oncology and Hematology [facility] [place], [place] [address] Tel. [phone]    Fax. [phone]

## 2019-05-19 DIAGNOSIS — D630 Anemia in neoplastic disease: Secondary | ICD-10-CM | POA: Diagnosis not present

## 2019-05-19 DIAGNOSIS — E669 Obesity, unspecified: Secondary | ICD-10-CM | POA: Diagnosis not present

## 2019-05-19 DIAGNOSIS — K5900 Constipation, unspecified: Secondary | ICD-10-CM | POA: Diagnosis not present

## 2019-05-19 DIAGNOSIS — C7951 Secondary malignant neoplasm of bone: Secondary | ICD-10-CM | POA: Diagnosis not present

## 2019-05-19 DIAGNOSIS — C50811 Malignant neoplasm of overlapping sites of right female breast: Secondary | ICD-10-CM | POA: Diagnosis not present

## 2019-05-19 DIAGNOSIS — R0602 Shortness of breath: Secondary | ICD-10-CM | POA: Diagnosis not present

## 2019-05-19 DIAGNOSIS — Z170 Estrogen receptor positive status [ER+]: Secondary | ICD-10-CM | POA: Diagnosis not present

## 2019-05-19 DIAGNOSIS — D649 Anemia, unspecified: Secondary | ICD-10-CM | POA: Diagnosis not present

## 2019-05-19 DIAGNOSIS — J9611 Chronic respiratory failure with hypoxia: Secondary | ICD-10-CM | POA: Diagnosis not present

## 2019-05-19 DIAGNOSIS — J90 Pleural effusion, not elsewhere classified: Secondary | ICD-10-CM | POA: Diagnosis not present

## 2019-05-19 DIAGNOSIS — M47816 Spondylosis without myelopathy or radiculopathy, lumbar region: Secondary | ICD-10-CM | POA: Diagnosis not present

## 2019-05-19 DIAGNOSIS — Z9181 History of falling: Secondary | ICD-10-CM | POA: Diagnosis not present

## 2019-05-19 DIAGNOSIS — C7801 Secondary malignant neoplasm of right lung: Secondary | ICD-10-CM | POA: Diagnosis not present

## 2019-05-19 DIAGNOSIS — M8458XD Pathological fracture in neoplastic disease, other specified site, subsequent encounter for fracture with routine healing: Secondary | ICD-10-CM | POA: Diagnosis not present

## 2019-05-19 DIAGNOSIS — G893 Neoplasm related pain (acute) (chronic): Secondary | ICD-10-CM | POA: Diagnosis not present

## 2019-05-19 DIAGNOSIS — M810 Age-related osteoporosis without current pathological fracture: Secondary | ICD-10-CM | POA: Diagnosis not present

## 2019-05-19 DIAGNOSIS — I10 Essential (primary) hypertension: Secondary | ICD-10-CM | POA: Diagnosis not present

## 2019-05-19 DIAGNOSIS — M84522D Pathological fracture in neoplastic disease, left humerus, subsequent encounter for fracture with routine healing: Secondary | ICD-10-CM | POA: Diagnosis not present

## 2019-05-19 LAB — CANCER ANTIGEN 27.29: CA 27.29: 46.9 U/mL — ABNORMAL HIGH (ref 0.0–38.6)

## 2019-05-20 ENCOUNTER — Telehealth: Payer: Self-pay

## 2019-05-20 DIAGNOSIS — R0602 Shortness of breath: Secondary | ICD-10-CM | POA: Diagnosis not present

## 2019-05-20 DIAGNOSIS — G893 Neoplasm related pain (acute) (chronic): Secondary | ICD-10-CM | POA: Diagnosis not present

## 2019-05-20 DIAGNOSIS — C50811 Malignant neoplasm of overlapping sites of right female breast: Secondary | ICD-10-CM | POA: Diagnosis not present

## 2019-05-20 DIAGNOSIS — C7951 Secondary malignant neoplasm of bone: Secondary | ICD-10-CM | POA: Diagnosis not present

## 2019-05-20 DIAGNOSIS — J90 Pleural effusion, not elsewhere classified: Secondary | ICD-10-CM | POA: Diagnosis not present

## 2019-05-20 DIAGNOSIS — D649 Anemia, unspecified: Secondary | ICD-10-CM | POA: Diagnosis not present

## 2019-05-20 DIAGNOSIS — C7801 Secondary malignant neoplasm of right lung: Secondary | ICD-10-CM | POA: Diagnosis not present

## 2019-05-20 NOTE — Telephone Encounter (Signed)
Attempted to contact Suzanne [name]/[employer] to inquire if she has received the information that she needs regarding the DME request.  Message left with call back requested to this CM [phone] as well as clinic fax number

## 2019-05-21 DIAGNOSIS — R0602 Shortness of breath: Secondary | ICD-10-CM | POA: Diagnosis not present

## 2019-05-21 DIAGNOSIS — D649 Anemia, unspecified: Secondary | ICD-10-CM | POA: Diagnosis not present

## 2019-05-21 DIAGNOSIS — E669 Obesity, unspecified: Secondary | ICD-10-CM | POA: Diagnosis not present

## 2019-05-21 DIAGNOSIS — D630 Anemia in neoplastic disease: Secondary | ICD-10-CM | POA: Diagnosis not present

## 2019-05-21 DIAGNOSIS — Z9181 History of falling: Secondary | ICD-10-CM | POA: Diagnosis not present

## 2019-05-21 DIAGNOSIS — J90 Pleural effusion, not elsewhere classified: Secondary | ICD-10-CM | POA: Diagnosis not present

## 2019-05-21 DIAGNOSIS — M84522D Pathological fracture in neoplastic disease, left humerus, subsequent encounter for fracture with routine healing: Secondary | ICD-10-CM | POA: Diagnosis not present

## 2019-05-21 DIAGNOSIS — C50811 Malignant neoplasm of overlapping sites of right female breast: Secondary | ICD-10-CM | POA: Diagnosis not present

## 2019-05-21 DIAGNOSIS — K5900 Constipation, unspecified: Secondary | ICD-10-CM | POA: Diagnosis not present

## 2019-05-21 DIAGNOSIS — C7801 Secondary malignant neoplasm of right lung: Secondary | ICD-10-CM | POA: Diagnosis not present

## 2019-05-21 DIAGNOSIS — G893 Neoplasm related pain (acute) (chronic): Secondary | ICD-10-CM | POA: Diagnosis not present

## 2019-05-21 DIAGNOSIS — Z170 Estrogen receptor positive status [ER+]: Secondary | ICD-10-CM | POA: Diagnosis not present

## 2019-05-21 DIAGNOSIS — M810 Age-related osteoporosis without current pathological fracture: Secondary | ICD-10-CM | POA: Diagnosis not present

## 2019-05-21 DIAGNOSIS — J9611 Chronic respiratory failure with hypoxia: Secondary | ICD-10-CM | POA: Diagnosis not present

## 2019-05-21 DIAGNOSIS — M8458XD Pathological fracture in neoplastic disease, other specified site, subsequent encounter for fracture with routine healing: Secondary | ICD-10-CM | POA: Diagnosis not present

## 2019-05-21 DIAGNOSIS — I10 Essential (primary) hypertension: Secondary | ICD-10-CM | POA: Diagnosis not present

## 2019-05-21 DIAGNOSIS — C7951 Secondary malignant neoplasm of bone: Secondary | ICD-10-CM | POA: Diagnosis not present

## 2019-05-21 DIAGNOSIS — M47816 Spondylosis without myelopathy or radiculopathy, lumbar region: Secondary | ICD-10-CM | POA: Diagnosis not present

## 2019-05-25 ENCOUNTER — Telehealth: Payer: Self-pay

## 2019-05-25 DIAGNOSIS — C7801 Secondary malignant neoplasm of right lung: Secondary | ICD-10-CM | POA: Diagnosis not present

## 2019-05-25 DIAGNOSIS — M84522D Pathological fracture in neoplastic disease, left humerus, subsequent encounter for fracture with routine healing: Secondary | ICD-10-CM | POA: Diagnosis not present

## 2019-05-25 DIAGNOSIS — D630 Anemia in neoplastic disease: Secondary | ICD-10-CM | POA: Diagnosis not present

## 2019-05-25 DIAGNOSIS — J90 Pleural effusion, not elsewhere classified: Secondary | ICD-10-CM | POA: Diagnosis not present

## 2019-05-25 DIAGNOSIS — M47816 Spondylosis without myelopathy or radiculopathy, lumbar region: Secondary | ICD-10-CM | POA: Diagnosis not present

## 2019-05-25 DIAGNOSIS — K5900 Constipation, unspecified: Secondary | ICD-10-CM | POA: Diagnosis not present

## 2019-05-25 DIAGNOSIS — G893 Neoplasm related pain (acute) (chronic): Secondary | ICD-10-CM | POA: Diagnosis not present

## 2019-05-25 DIAGNOSIS — M8458XD Pathological fracture in neoplastic disease, other specified site, subsequent encounter for fracture with routine healing: Secondary | ICD-10-CM | POA: Diagnosis not present

## 2019-05-25 DIAGNOSIS — C50811 Malignant neoplasm of overlapping sites of right female breast: Secondary | ICD-10-CM | POA: Diagnosis not present

## 2019-05-25 DIAGNOSIS — R0602 Shortness of breath: Secondary | ICD-10-CM | POA: Diagnosis not present

## 2019-05-25 DIAGNOSIS — M810 Age-related osteoporosis without current pathological fracture: Secondary | ICD-10-CM | POA: Diagnosis not present

## 2019-05-25 DIAGNOSIS — D649 Anemia, unspecified: Secondary | ICD-10-CM | POA: Diagnosis not present

## 2019-05-25 DIAGNOSIS — Z170 Estrogen receptor positive status [ER+]: Secondary | ICD-10-CM | POA: Diagnosis not present

## 2019-05-25 DIAGNOSIS — J9611 Chronic respiratory failure with hypoxia: Secondary | ICD-10-CM | POA: Diagnosis not present

## 2019-05-25 DIAGNOSIS — I10 Essential (primary) hypertension: Secondary | ICD-10-CM | POA: Diagnosis not present

## 2019-05-25 DIAGNOSIS — E669 Obesity, unspecified: Secondary | ICD-10-CM | POA: Diagnosis not present

## 2019-05-25 DIAGNOSIS — Z9181 History of falling: Secondary | ICD-10-CM | POA: Diagnosis not present

## 2019-05-25 DIAGNOSIS — C7951 Secondary malignant neoplasm of bone: Secondary | ICD-10-CM | POA: Diagnosis not present

## 2019-05-25 NOTE — Telephone Encounter (Signed)
Call placed to Suzanne [name] /[employer] she said that there is a medicaid authorization in their record for a wheelchair. She will re-send a CMN for O2 to [facility] for signature.  She requested that the signed CMN for the hospital bed be faxed to # [phone] and it was faxed as requested.

## 2019-05-26 DIAGNOSIS — Z9181 History of falling: Secondary | ICD-10-CM | POA: Diagnosis not present

## 2019-05-26 DIAGNOSIS — M8458XD Pathological fracture in neoplastic disease, other specified site, subsequent encounter for fracture with routine healing: Secondary | ICD-10-CM | POA: Diagnosis not present

## 2019-05-26 DIAGNOSIS — M810 Age-related osteoporosis without current pathological fracture: Secondary | ICD-10-CM | POA: Diagnosis not present

## 2019-05-26 DIAGNOSIS — C7951 Secondary malignant neoplasm of bone: Secondary | ICD-10-CM | POA: Diagnosis not present

## 2019-05-26 DIAGNOSIS — J90 Pleural effusion, not elsewhere classified: Secondary | ICD-10-CM | POA: Diagnosis not present

## 2019-05-26 DIAGNOSIS — I10 Essential (primary) hypertension: Secondary | ICD-10-CM | POA: Diagnosis not present

## 2019-05-26 DIAGNOSIS — G893 Neoplasm related pain (acute) (chronic): Secondary | ICD-10-CM | POA: Diagnosis not present

## 2019-05-26 DIAGNOSIS — C50811 Malignant neoplasm of overlapping sites of right female breast: Secondary | ICD-10-CM | POA: Diagnosis not present

## 2019-05-26 DIAGNOSIS — D649 Anemia, unspecified: Secondary | ICD-10-CM | POA: Diagnosis not present

## 2019-05-26 DIAGNOSIS — K5900 Constipation, unspecified: Secondary | ICD-10-CM | POA: Diagnosis not present

## 2019-05-26 DIAGNOSIS — E669 Obesity, unspecified: Secondary | ICD-10-CM | POA: Diagnosis not present

## 2019-05-26 DIAGNOSIS — Z170 Estrogen receptor positive status [ER+]: Secondary | ICD-10-CM | POA: Diagnosis not present

## 2019-05-26 DIAGNOSIS — M84522D Pathological fracture in neoplastic disease, left humerus, subsequent encounter for fracture with routine healing: Secondary | ICD-10-CM | POA: Diagnosis not present

## 2019-05-26 DIAGNOSIS — C7801 Secondary malignant neoplasm of right lung: Secondary | ICD-10-CM | POA: Diagnosis not present

## 2019-05-26 DIAGNOSIS — J9611 Chronic respiratory failure with hypoxia: Secondary | ICD-10-CM | POA: Diagnosis not present

## 2019-05-26 DIAGNOSIS — R0602 Shortness of breath: Secondary | ICD-10-CM | POA: Diagnosis not present

## 2019-05-26 DIAGNOSIS — M47816 Spondylosis without myelopathy or radiculopathy, lumbar region: Secondary | ICD-10-CM | POA: Diagnosis not present

## 2019-05-26 DIAGNOSIS — D630 Anemia in neoplastic disease: Secondary | ICD-10-CM | POA: Diagnosis not present

## 2019-05-28 DIAGNOSIS — G893 Neoplasm related pain (acute) (chronic): Secondary | ICD-10-CM | POA: Diagnosis not present

## 2019-05-28 DIAGNOSIS — C7801 Secondary malignant neoplasm of right lung: Secondary | ICD-10-CM | POA: Diagnosis not present

## 2019-05-28 DIAGNOSIS — J90 Pleural effusion, not elsewhere classified: Secondary | ICD-10-CM | POA: Diagnosis not present

## 2019-05-28 DIAGNOSIS — R0602 Shortness of breath: Secondary | ICD-10-CM | POA: Diagnosis not present

## 2019-05-28 DIAGNOSIS — D649 Anemia, unspecified: Secondary | ICD-10-CM | POA: Diagnosis not present

## 2019-05-28 DIAGNOSIS — C7951 Secondary malignant neoplasm of bone: Secondary | ICD-10-CM | POA: Diagnosis not present

## 2019-05-28 DIAGNOSIS — C50811 Malignant neoplasm of overlapping sites of right female breast: Secondary | ICD-10-CM | POA: Diagnosis not present

## 2019-05-29 DIAGNOSIS — G893 Neoplasm related pain (acute) (chronic): Secondary | ICD-10-CM | POA: Diagnosis not present

## 2019-05-29 DIAGNOSIS — C7951 Secondary malignant neoplasm of bone: Secondary | ICD-10-CM | POA: Diagnosis not present

## 2019-05-29 DIAGNOSIS — S22000A Wedge compression fracture of unspecified thoracic vertebra, initial encounter for closed fracture: Secondary | ICD-10-CM | POA: Diagnosis not present

## 2019-05-29 DIAGNOSIS — D649 Anemia, unspecified: Secondary | ICD-10-CM | POA: Diagnosis not present

## 2019-05-29 DIAGNOSIS — J9621 Acute and chronic respiratory failure with hypoxia: Secondary | ICD-10-CM | POA: Diagnosis not present

## 2019-05-29 DIAGNOSIS — C50811 Malignant neoplasm of overlapping sites of right female breast: Secondary | ICD-10-CM | POA: Diagnosis not present

## 2019-05-29 DIAGNOSIS — C7801 Secondary malignant neoplasm of right lung: Secondary | ICD-10-CM | POA: Diagnosis not present

## 2019-05-29 DIAGNOSIS — J90 Pleural effusion, not elsewhere classified: Secondary | ICD-10-CM | POA: Diagnosis not present

## 2019-05-29 DIAGNOSIS — R0602 Shortness of breath: Secondary | ICD-10-CM | POA: Diagnosis not present

## 2019-05-31 DIAGNOSIS — Z170 Estrogen receptor positive status [ER+]: Secondary | ICD-10-CM | POA: Diagnosis not present

## 2019-05-31 DIAGNOSIS — E669 Obesity, unspecified: Secondary | ICD-10-CM | POA: Diagnosis not present

## 2019-05-31 DIAGNOSIS — M47816 Spondylosis without myelopathy or radiculopathy, lumbar region: Secondary | ICD-10-CM | POA: Diagnosis not present

## 2019-05-31 DIAGNOSIS — D630 Anemia in neoplastic disease: Secondary | ICD-10-CM | POA: Diagnosis not present

## 2019-05-31 DIAGNOSIS — C7801 Secondary malignant neoplasm of right lung: Secondary | ICD-10-CM | POA: Diagnosis not present

## 2019-05-31 DIAGNOSIS — M810 Age-related osteoporosis without current pathological fracture: Secondary | ICD-10-CM | POA: Diagnosis not present

## 2019-05-31 DIAGNOSIS — C7951 Secondary malignant neoplasm of bone: Secondary | ICD-10-CM | POA: Diagnosis not present

## 2019-05-31 DIAGNOSIS — G893 Neoplasm related pain (acute) (chronic): Secondary | ICD-10-CM | POA: Diagnosis not present

## 2019-05-31 DIAGNOSIS — I10 Essential (primary) hypertension: Secondary | ICD-10-CM | POA: Diagnosis not present

## 2019-05-31 DIAGNOSIS — J9611 Chronic respiratory failure with hypoxia: Secondary | ICD-10-CM | POA: Diagnosis not present

## 2019-05-31 DIAGNOSIS — C50811 Malignant neoplasm of overlapping sites of right female breast: Secondary | ICD-10-CM | POA: Diagnosis not present

## 2019-05-31 DIAGNOSIS — K5900 Constipation, unspecified: Secondary | ICD-10-CM | POA: Diagnosis not present

## 2019-05-31 DIAGNOSIS — Z9181 History of falling: Secondary | ICD-10-CM | POA: Diagnosis not present

## 2019-05-31 DIAGNOSIS — M84522D Pathological fracture in neoplastic disease, left humerus, subsequent encounter for fracture with routine healing: Secondary | ICD-10-CM | POA: Diagnosis not present

## 2019-05-31 DIAGNOSIS — M8458XD Pathological fracture in neoplastic disease, other specified site, subsequent encounter for fracture with routine healing: Secondary | ICD-10-CM | POA: Diagnosis not present

## 2019-06-02 ENCOUNTER — Telehealth: Payer: Self-pay

## 2019-06-02 DIAGNOSIS — M8458XD Pathological fracture in neoplastic disease, other specified site, subsequent encounter for fracture with routine healing: Secondary | ICD-10-CM | POA: Diagnosis not present

## 2019-06-02 DIAGNOSIS — D649 Anemia, unspecified: Secondary | ICD-10-CM | POA: Diagnosis not present

## 2019-06-02 DIAGNOSIS — M84522D Pathological fracture in neoplastic disease, left humerus, subsequent encounter for fracture with routine healing: Secondary | ICD-10-CM | POA: Diagnosis not present

## 2019-06-02 DIAGNOSIS — C50811 Malignant neoplasm of overlapping sites of right female breast: Secondary | ICD-10-CM | POA: Diagnosis not present

## 2019-06-02 DIAGNOSIS — C7951 Secondary malignant neoplasm of bone: Secondary | ICD-10-CM | POA: Diagnosis not present

## 2019-06-02 DIAGNOSIS — J90 Pleural effusion, not elsewhere classified: Secondary | ICD-10-CM | POA: Diagnosis not present

## 2019-06-02 DIAGNOSIS — Z170 Estrogen receptor positive status [ER+]: Secondary | ICD-10-CM | POA: Diagnosis not present

## 2019-06-02 DIAGNOSIS — D630 Anemia in neoplastic disease: Secondary | ICD-10-CM | POA: Diagnosis not present

## 2019-06-02 DIAGNOSIS — R0602 Shortness of breath: Secondary | ICD-10-CM | POA: Diagnosis not present

## 2019-06-02 DIAGNOSIS — K5900 Constipation, unspecified: Secondary | ICD-10-CM | POA: Diagnosis not present

## 2019-06-02 DIAGNOSIS — I10 Essential (primary) hypertension: Secondary | ICD-10-CM | POA: Diagnosis not present

## 2019-06-02 DIAGNOSIS — M810 Age-related osteoporosis without current pathological fracture: Secondary | ICD-10-CM | POA: Diagnosis not present

## 2019-06-02 DIAGNOSIS — C7801 Secondary malignant neoplasm of right lung: Secondary | ICD-10-CM | POA: Diagnosis not present

## 2019-06-02 DIAGNOSIS — M47816 Spondylosis without myelopathy or radiculopathy, lumbar region: Secondary | ICD-10-CM | POA: Diagnosis not present

## 2019-06-02 DIAGNOSIS — E669 Obesity, unspecified: Secondary | ICD-10-CM | POA: Diagnosis not present

## 2019-06-02 DIAGNOSIS — G893 Neoplasm related pain (acute) (chronic): Secondary | ICD-10-CM | POA: Diagnosis not present

## 2019-06-02 DIAGNOSIS — J9611 Chronic respiratory failure with hypoxia: Secondary | ICD-10-CM | POA: Diagnosis not present

## 2019-06-02 DIAGNOSIS — Z9181 History of falling: Secondary | ICD-10-CM | POA: Diagnosis not present

## 2019-06-02 NOTE — Telephone Encounter (Signed)
CMN for hospital bed, mattress and bed rails faxed again to [facility]

## 2019-06-02 NOTE — Telephone Encounter (Signed)
Message left for Suzanne [name] /[employer] [facility] # [phone] x [phone] to clarify CMN for O2 as Dr Chapman Fitch did not place the order for O2 and the fax is addressed to Ammie Sulp. MD. Call back requested to this CM

## 2019-06-03 ENCOUNTER — Telehealth: Payer: Self-pay | Admitting: *Deleted

## 2019-06-03 ENCOUNTER — Encounter: Payer: Self-pay | Admitting: Family Medicine

## 2019-06-03 ENCOUNTER — Ambulatory Visit: Payer: Medicaid Other | Attending: Family Medicine | Admitting: Family Medicine

## 2019-06-03 ENCOUNTER — Other Ambulatory Visit: Payer: Self-pay

## 2019-06-03 DIAGNOSIS — G893 Neoplasm related pain (acute) (chronic): Secondary | ICD-10-CM | POA: Diagnosis not present

## 2019-06-03 DIAGNOSIS — Z7969 Long term (current) use of other immunomodulators and immunosuppressants: Secondary | ICD-10-CM

## 2019-06-03 DIAGNOSIS — M8458XD Pathological fracture in neoplastic disease, other specified site, subsequent encounter for fracture with routine healing: Secondary | ICD-10-CM | POA: Diagnosis not present

## 2019-06-03 DIAGNOSIS — R21 Rash and other nonspecific skin eruption: Secondary | ICD-10-CM | POA: Diagnosis not present

## 2019-06-03 DIAGNOSIS — C7801 Secondary malignant neoplasm of right lung: Secondary | ICD-10-CM | POA: Diagnosis not present

## 2019-06-03 DIAGNOSIS — H9193 Unspecified hearing loss, bilateral: Secondary | ICD-10-CM

## 2019-06-03 DIAGNOSIS — C50811 Malignant neoplasm of overlapping sites of right female breast: Secondary | ICD-10-CM

## 2019-06-03 DIAGNOSIS — M84522D Pathological fracture in neoplastic disease, left humerus, subsequent encounter for fracture with routine healing: Secondary | ICD-10-CM | POA: Diagnosis not present

## 2019-06-03 DIAGNOSIS — J9621 Acute and chronic respiratory failure with hypoxia: Secondary | ICD-10-CM | POA: Diagnosis not present

## 2019-06-03 DIAGNOSIS — J9612 Chronic respiratory failure with hypercapnia: Secondary | ICD-10-CM

## 2019-06-03 DIAGNOSIS — J9611 Chronic respiratory failure with hypoxia: Secondary | ICD-10-CM | POA: Diagnosis not present

## 2019-06-03 DIAGNOSIS — H409 Unspecified glaucoma: Secondary | ICD-10-CM

## 2019-06-03 DIAGNOSIS — C7951 Secondary malignant neoplasm of bone: Secondary | ICD-10-CM | POA: Diagnosis not present

## 2019-06-03 DIAGNOSIS — I10 Essential (primary) hypertension: Secondary | ICD-10-CM | POA: Diagnosis not present

## 2019-06-03 DIAGNOSIS — J90 Pleural effusion, not elsewhere classified: Secondary | ICD-10-CM | POA: Diagnosis not present

## 2019-06-03 DIAGNOSIS — Z79899 Other long term (current) drug therapy: Secondary | ICD-10-CM | POA: Diagnosis not present

## 2019-06-03 DIAGNOSIS — Z170 Estrogen receptor positive status [ER+]: Secondary | ICD-10-CM

## 2019-06-03 DIAGNOSIS — D649 Anemia, unspecified: Secondary | ICD-10-CM | POA: Diagnosis not present

## 2019-06-03 DIAGNOSIS — D630 Anemia in neoplastic disease: Secondary | ICD-10-CM | POA: Diagnosis not present

## 2019-06-03 DIAGNOSIS — M47816 Spondylosis without myelopathy or radiculopathy, lumbar region: Secondary | ICD-10-CM | POA: Diagnosis not present

## 2019-06-03 DIAGNOSIS — E669 Obesity, unspecified: Secondary | ICD-10-CM | POA: Diagnosis not present

## 2019-06-03 DIAGNOSIS — M810 Age-related osteoporosis without current pathological fracture: Secondary | ICD-10-CM | POA: Diagnosis not present

## 2019-06-03 DIAGNOSIS — K5900 Constipation, unspecified: Secondary | ICD-10-CM | POA: Diagnosis not present

## 2019-06-03 DIAGNOSIS — F329 Major depressive disorder, single episode, unspecified: Secondary | ICD-10-CM | POA: Diagnosis not present

## 2019-06-03 DIAGNOSIS — R0602 Shortness of breath: Secondary | ICD-10-CM | POA: Diagnosis not present

## 2019-06-03 DIAGNOSIS — Z9181 History of falling: Secondary | ICD-10-CM | POA: Diagnosis not present

## 2019-06-03 MED ORDER — FLUCONAZOLE 100 MG PO TABS: 100.0000 mg | ORAL_TABLET | Freq: Every day | ORAL | 0 refills | Status: AC

## 2019-06-03 NOTE — Progress Notes (Signed)
Virtual Visit via Telephone Note  I connected with Suzanne [name]  on 06/03/19 at  2:30 PM EST by telephone and verified that I am speaking with the correct person using two identifiers.   I discussed the limitations, risks, security and privacy concerns of performing an evaluation and management service by telephone and the availability of in person appointments. I also discussed with the patient that there may be a patient responsible charge related to this service. The patient expressed understanding and agreed to proceed.  Patient Location: Home Provider Location: [facility] Others participating in call: patient's daughter Suzanne [name]   History of Present Illness:        59 year old female with stage IV metastatic breast cancer with oxygen dependent chronic respiratory failure, malignant pleural effusions, issues with pain related to metastatic breast cancer with metastases, left humeral and vertebral compression fractures due to metastatic disease, history of glaucoma and anemia.  Patient and daughter report that patient is doing well at this time.  She does continue to use her oxygen but denies any increased shortness of breath or cough.  Appetite remains good though patient does eat small portions.  She is taking nutritional supplements between meals.  She denies any significant pain at this time.  She did follow-up with oncology on 05/09/2019 and patient is on a chemotherapeutic regimen.  She does have some issues with fatigue.  She reports that she does sleep well.        She reports that she was previously receiving physical therapy but for some reason this is not currently occurring and she would like to resume physical therapy for strengthening, gait and balance.  She also reports that she has had no recent dental follow-up and she would like to see a dentist for routine care due to her use of chemotherapeutic medications and her overall health.  She also reports a history of glaucoma and  has had no recent follow-up with an eye care specialist and would like to have referral to see someone in follow-up of her glaucoma.  Daughter reports that her mother has issues with hearing and daughter would like for patient to be seen by a specialist to assess her hearing.  Overall, patient feels that her health is improving.  Patient and daughter report that home health comes to the home regularly and home health nurse has tried applying a different medication to the area of rash beneath the patient right breast/chest area and the rash does appear to be improving.         Patient denies any current issues with chest pain or palpitations, no new cough and no increase in shortness of breath.  No fever or chills.  No current nausea/vomiting/diarrhea or constipation.  Patient has noticed no unusual bruising or bleeding.  She denies any dysuria or urinary frequency.  She has had no blood in the stool or black stools.  She denies any increase in lower extremity edema.  She feels that she is stable at this time.  Both she and her daughter feel that the sertraline is helping with patient's mood/depression.  She denies any issues with suicidal thoughts or ideation.  Past Medical History:  Diagnosis Date  . Cancer ([place])   . Family history of breast cancer   . Family history of colon cancer   . Family history of melanoma   . Family history of prostate cancer   . Gallstones   . Glaucoma   . Mastitis    reports history  of recurrent mastitis  . Metastatic breast cancer ([place])   . Obesity     Past Surgical History:  Procedure Laterality Date  . GLAUCOMA SURGERY  late 1990's  . IR THORACENTESIS ASP PLEURAL SPACE W/IMG GUIDE  10/28/2018  . IR THORACENTESIS ASP PLEURAL SPACE W/IMG GUIDE  10/29/2018  . OPEN REDUCTION INTERNAL FIXATION (ORIF) DISTAL RADIAL FRACTURE Left 01/09/2019   Procedure: OPEN REDUCTION INTERNAL FIXATION (ORIF) HUMERAL FRACTURE;  Surgeon: Netta Cedars, MD;  Location: [facility];  Service:  Orthopedics;  Laterality: Left;    Family History  Problem Relation Age of Onset  . Breast cancer Sister        in her 39's-70s  . Heart disease Brother        CABG  . Heart disease Brother        CABG  . Skin cancer Brother        melanoma  . Colon cancer Cousin   . Heart attack Mother   . Heart attack Father   . Prostate cancer Brother     Social History   Tobacco Use  . Smoking status: Never Smoker  . Smokeless tobacco: Never Used  . Tobacco comment: second hand smoke exposure  Substance Use Topics  . Alcohol use: No  . Drug use: No     No Known Allergies     Observations/Objective: No vital signs or physical exam conducted as visit was done via telephone  Assessment and Plan: 1. Malignant neoplasm of overlapping sites of right breast in female, estrogen receptor positive ([place]) 2. Bone metastases ([place]) Daughter and patient report that patient was receiving physical therapy to help with strengthening and mobility and patient would like to resume home physical therapy.  She is able to ambulate short distances with the use of a walker and current pain is adequately controlled on pain medications prescribed per oncology.  Imaging done by cancer center shows diffuse osseous metastatic disease to the thoracic and lumbar spine. - Ambulatory referral to Physical Therapy  3. Chronic respiratory failure with hypoxia and hypercapnia ([facility]) Continue home use of oxygen therapy and the as needed use of albuterol.  She feels that her breathing is stable at this time.  Continue follow-up with oncology regarding right pleural effusion  4. Rash Daughter states that patient's chronic rash has shown some improvement with treatment by home health care but will call if rash worsens or any other concerns.  5. Decreased hearing, bilateral Daughter request ENT referral secondary to patient with bilateral hearing loss - Ambulatory referral to ENT  6. On antineoplastic chemotherapy Will  place referral per patient and daughter's request for follow-up with dentistry as patient with need for dental follow-up/oral care and is on chemotherapy which can cause dental issues - Ambulatory referral to Dentistry  7. Glaucoma of both eyes, unspecified glaucoma type Patient and daughter request ophthalmology referral and follow-up of patient with glaucoma for which she has not had no recent follow-up - Ambulatory referral to Ophthalmology  8. Reactive depression Patient is doing well on current sertraline for treatment of reactive depression and she will continue her current dose.   Follow Up Instructions:Return in about 6 weeks (around 07/15/2019) for reactive depression and chronic issues; sooner if needed.    I discussed the assessment and treatment plan with the patient. The patient was provided an opportunity to ask questions and all were answered. The patient agreed with the plan and demonstrated an understanding of the instructions.   The patient was  advised to call back or seek an in-person evaluation if the symptoms worsen or if the condition fails to improve as anticipated.  I provided 22 minutes of non-face-to-face time during this encounter.   Antony Blackbird, MD

## 2019-06-04 DIAGNOSIS — C7801 Secondary malignant neoplasm of right lung: Secondary | ICD-10-CM | POA: Diagnosis not present

## 2019-06-04 DIAGNOSIS — J90 Pleural effusion, not elsewhere classified: Secondary | ICD-10-CM | POA: Diagnosis not present

## 2019-06-04 DIAGNOSIS — D649 Anemia, unspecified: Secondary | ICD-10-CM | POA: Diagnosis not present

## 2019-06-04 DIAGNOSIS — R0602 Shortness of breath: Secondary | ICD-10-CM | POA: Diagnosis not present

## 2019-06-04 DIAGNOSIS — C50811 Malignant neoplasm of overlapping sites of right female breast: Secondary | ICD-10-CM | POA: Diagnosis not present

## 2019-06-04 DIAGNOSIS — G893 Neoplasm related pain (acute) (chronic): Secondary | ICD-10-CM | POA: Diagnosis not present

## 2019-06-04 DIAGNOSIS — C7951 Secondary malignant neoplasm of bone: Secondary | ICD-10-CM | POA: Diagnosis not present

## 2019-06-05 ENCOUNTER — Telehealth: Payer: Self-pay

## 2019-06-05 NOTE — Telephone Encounter (Signed)
Referral received for home PT with note that she is currently receiving OT and ST from [facility],   Call placed to [facility], spoke to [place] and placed referral for PT. Also faxed referral to [facility] referrals -  Fax 3 [phone]

## 2019-06-06 DIAGNOSIS — D630 Anemia in neoplastic disease: Secondary | ICD-10-CM | POA: Diagnosis not present

## 2019-06-06 DIAGNOSIS — C7951 Secondary malignant neoplasm of bone: Secondary | ICD-10-CM | POA: Diagnosis not present

## 2019-06-06 DIAGNOSIS — C7801 Secondary malignant neoplasm of right lung: Secondary | ICD-10-CM | POA: Diagnosis not present

## 2019-06-06 DIAGNOSIS — Z9181 History of falling: Secondary | ICD-10-CM | POA: Diagnosis not present

## 2019-06-06 DIAGNOSIS — E669 Obesity, unspecified: Secondary | ICD-10-CM | POA: Diagnosis not present

## 2019-06-06 DIAGNOSIS — M84522D Pathological fracture in neoplastic disease, left humerus, subsequent encounter for fracture with routine healing: Secondary | ICD-10-CM | POA: Diagnosis not present

## 2019-06-06 DIAGNOSIS — G893 Neoplasm related pain (acute) (chronic): Secondary | ICD-10-CM | POA: Diagnosis not present

## 2019-06-06 DIAGNOSIS — C50811 Malignant neoplasm of overlapping sites of right female breast: Secondary | ICD-10-CM | POA: Diagnosis not present

## 2019-06-06 DIAGNOSIS — M810 Age-related osteoporosis without current pathological fracture: Secondary | ICD-10-CM | POA: Diagnosis not present

## 2019-06-06 DIAGNOSIS — Z170 Estrogen receptor positive status [ER+]: Secondary | ICD-10-CM | POA: Diagnosis not present

## 2019-06-06 DIAGNOSIS — J9611 Chronic respiratory failure with hypoxia: Secondary | ICD-10-CM | POA: Diagnosis not present

## 2019-06-06 DIAGNOSIS — K5900 Constipation, unspecified: Secondary | ICD-10-CM | POA: Diagnosis not present

## 2019-06-06 DIAGNOSIS — M47816 Spondylosis without myelopathy or radiculopathy, lumbar region: Secondary | ICD-10-CM | POA: Diagnosis not present

## 2019-06-06 DIAGNOSIS — M8458XD Pathological fracture in neoplastic disease, other specified site, subsequent encounter for fracture with routine healing: Secondary | ICD-10-CM | POA: Diagnosis not present

## 2019-06-06 DIAGNOSIS — I10 Essential (primary) hypertension: Secondary | ICD-10-CM | POA: Diagnosis not present

## 2019-06-09 ENCOUNTER — Other Ambulatory Visit: Payer: Self-pay | Admitting: *Deleted

## 2019-06-09 DIAGNOSIS — E669 Obesity, unspecified: Secondary | ICD-10-CM | POA: Diagnosis not present

## 2019-06-09 DIAGNOSIS — R0602 Shortness of breath: Secondary | ICD-10-CM | POA: Diagnosis not present

## 2019-06-09 DIAGNOSIS — D649 Anemia, unspecified: Secondary | ICD-10-CM | POA: Diagnosis not present

## 2019-06-09 DIAGNOSIS — G893 Neoplasm related pain (acute) (chronic): Secondary | ICD-10-CM | POA: Diagnosis not present

## 2019-06-09 DIAGNOSIS — M810 Age-related osteoporosis without current pathological fracture: Secondary | ICD-10-CM | POA: Diagnosis not present

## 2019-06-09 DIAGNOSIS — C7951 Secondary malignant neoplasm of bone: Secondary | ICD-10-CM | POA: Diagnosis not present

## 2019-06-09 DIAGNOSIS — Z9181 History of falling: Secondary | ICD-10-CM | POA: Diagnosis not present

## 2019-06-09 DIAGNOSIS — M84522D Pathological fracture in neoplastic disease, left humerus, subsequent encounter for fracture with routine healing: Secondary | ICD-10-CM | POA: Diagnosis not present

## 2019-06-09 DIAGNOSIS — J90 Pleural effusion, not elsewhere classified: Secondary | ICD-10-CM | POA: Diagnosis not present

## 2019-06-09 DIAGNOSIS — Z170 Estrogen receptor positive status [ER+]: Secondary | ICD-10-CM | POA: Diagnosis not present

## 2019-06-09 DIAGNOSIS — D630 Anemia in neoplastic disease: Secondary | ICD-10-CM | POA: Diagnosis not present

## 2019-06-09 DIAGNOSIS — K5900 Constipation, unspecified: Secondary | ICD-10-CM | POA: Diagnosis not present

## 2019-06-09 DIAGNOSIS — I10 Essential (primary) hypertension: Secondary | ICD-10-CM | POA: Diagnosis not present

## 2019-06-09 DIAGNOSIS — J9611 Chronic respiratory failure with hypoxia: Secondary | ICD-10-CM | POA: Diagnosis not present

## 2019-06-09 DIAGNOSIS — M47816 Spondylosis without myelopathy or radiculopathy, lumbar region: Secondary | ICD-10-CM | POA: Diagnosis not present

## 2019-06-09 DIAGNOSIS — C50811 Malignant neoplasm of overlapping sites of right female breast: Secondary | ICD-10-CM | POA: Diagnosis not present

## 2019-06-09 DIAGNOSIS — C7801 Secondary malignant neoplasm of right lung: Secondary | ICD-10-CM | POA: Diagnosis not present

## 2019-06-09 DIAGNOSIS — M8458XD Pathological fracture in neoplastic disease, other specified site, subsequent encounter for fracture with routine healing: Secondary | ICD-10-CM | POA: Diagnosis not present

## 2019-06-09 MED ORDER — KETOCONAZOLE 2 % EX CREA: 1.0000 "application " | TOPICAL_CREAM | Freq: Every day | CUTANEOUS | 1 refills | Status: DC

## 2019-06-10 ENCOUNTER — Telehealth: Payer: Self-pay

## 2019-06-10 DIAGNOSIS — G893 Neoplasm related pain (acute) (chronic): Secondary | ICD-10-CM | POA: Diagnosis not present

## 2019-06-10 DIAGNOSIS — C7801 Secondary malignant neoplasm of right lung: Secondary | ICD-10-CM | POA: Diagnosis not present

## 2019-06-10 DIAGNOSIS — C50811 Malignant neoplasm of overlapping sites of right female breast: Secondary | ICD-10-CM | POA: Diagnosis not present

## 2019-06-10 DIAGNOSIS — R0602 Shortness of breath: Secondary | ICD-10-CM | POA: Diagnosis not present

## 2019-06-10 DIAGNOSIS — D649 Anemia, unspecified: Secondary | ICD-10-CM | POA: Diagnosis not present

## 2019-06-10 DIAGNOSIS — C7951 Secondary malignant neoplasm of bone: Secondary | ICD-10-CM | POA: Diagnosis not present

## 2019-06-10 DIAGNOSIS — J90 Pleural effusion, not elsewhere classified: Secondary | ICD-10-CM | POA: Diagnosis not present

## 2019-06-10 NOTE — Telephone Encounter (Signed)
Call placed to [facility] # [phone] x [phone] regarding CMN for O2.  Message left with call back requested to this CM

## 2019-06-12 DIAGNOSIS — E669 Obesity, unspecified: Secondary | ICD-10-CM | POA: Diagnosis not present

## 2019-06-12 DIAGNOSIS — Z9181 History of falling: Secondary | ICD-10-CM | POA: Diagnosis not present

## 2019-06-12 DIAGNOSIS — R0602 Shortness of breath: Secondary | ICD-10-CM | POA: Diagnosis not present

## 2019-06-12 DIAGNOSIS — J90 Pleural effusion, not elsewhere classified: Secondary | ICD-10-CM | POA: Diagnosis not present

## 2019-06-12 DIAGNOSIS — I10 Essential (primary) hypertension: Secondary | ICD-10-CM | POA: Diagnosis not present

## 2019-06-12 DIAGNOSIS — K5900 Constipation, unspecified: Secondary | ICD-10-CM | POA: Diagnosis not present

## 2019-06-12 DIAGNOSIS — M810 Age-related osteoporosis without current pathological fracture: Secondary | ICD-10-CM | POA: Diagnosis not present

## 2019-06-12 DIAGNOSIS — C7951 Secondary malignant neoplasm of bone: Secondary | ICD-10-CM | POA: Diagnosis not present

## 2019-06-12 DIAGNOSIS — M84522D Pathological fracture in neoplastic disease, left humerus, subsequent encounter for fracture with routine healing: Secondary | ICD-10-CM | POA: Diagnosis not present

## 2019-06-12 DIAGNOSIS — J9611 Chronic respiratory failure with hypoxia: Secondary | ICD-10-CM | POA: Diagnosis not present

## 2019-06-12 DIAGNOSIS — G893 Neoplasm related pain (acute) (chronic): Secondary | ICD-10-CM | POA: Diagnosis not present

## 2019-06-12 DIAGNOSIS — Z170 Estrogen receptor positive status [ER+]: Secondary | ICD-10-CM | POA: Diagnosis not present

## 2019-06-12 DIAGNOSIS — D630 Anemia in neoplastic disease: Secondary | ICD-10-CM | POA: Diagnosis not present

## 2019-06-12 DIAGNOSIS — C7801 Secondary malignant neoplasm of right lung: Secondary | ICD-10-CM | POA: Diagnosis not present

## 2019-06-12 DIAGNOSIS — M8458XD Pathological fracture in neoplastic disease, other specified site, subsequent encounter for fracture with routine healing: Secondary | ICD-10-CM | POA: Diagnosis not present

## 2019-06-12 DIAGNOSIS — M47816 Spondylosis without myelopathy or radiculopathy, lumbar region: Secondary | ICD-10-CM | POA: Diagnosis not present

## 2019-06-12 DIAGNOSIS — C50811 Malignant neoplasm of overlapping sites of right female breast: Secondary | ICD-10-CM | POA: Diagnosis not present

## 2019-06-12 DIAGNOSIS — D649 Anemia, unspecified: Secondary | ICD-10-CM | POA: Diagnosis not present

## 2019-06-15 ENCOUNTER — Inpatient Hospital Stay: Payer: Medicaid Other | Attending: Adult Health

## 2019-06-15 ENCOUNTER — Other Ambulatory Visit: Payer: Self-pay

## 2019-06-15 ENCOUNTER — Inpatient Hospital Stay: Payer: Medicaid Other

## 2019-06-15 VITALS — BP 138/83 | HR 76 | Temp 98.0°F | Resp 16

## 2019-06-15 DIAGNOSIS — C7951 Secondary malignant neoplasm of bone: Secondary | ICD-10-CM | POA: Insufficient documentation

## 2019-06-15 DIAGNOSIS — C7801 Secondary malignant neoplasm of right lung: Secondary | ICD-10-CM | POA: Diagnosis not present

## 2019-06-15 DIAGNOSIS — J90 Pleural effusion, not elsewhere classified: Secondary | ICD-10-CM

## 2019-06-15 DIAGNOSIS — C50911 Malignant neoplasm of unspecified site of right female breast: Secondary | ICD-10-CM | POA: Insufficient documentation

## 2019-06-15 DIAGNOSIS — C50811 Malignant neoplasm of overlapping sites of right female breast: Secondary | ICD-10-CM

## 2019-06-15 DIAGNOSIS — R0602 Shortness of breath: Secondary | ICD-10-CM

## 2019-06-15 DIAGNOSIS — C7802 Secondary malignant neoplasm of left lung: Secondary | ICD-10-CM | POA: Diagnosis not present

## 2019-06-15 DIAGNOSIS — D649 Anemia, unspecified: Secondary | ICD-10-CM

## 2019-06-15 DIAGNOSIS — G893 Neoplasm related pain (acute) (chronic): Secondary | ICD-10-CM

## 2019-06-15 LAB — COMPREHENSIVE METABOLIC PANEL
ALT: 11 U/L (ref 0–44)
AST: 17 U/L (ref 15–41)
Albumin: 3.7 g/dL (ref 3.5–5.0)
Alkaline Phosphatase: 149 U/L — ABNORMAL HIGH (ref 38–126)
Anion gap: 12 (ref 5–15)
BUN: 20 mg/dL (ref 6–20)
CO2: 30 mmol/L (ref 22–32)
Calcium: 8.7 mg/dL — ABNORMAL LOW (ref 8.9–10.3)
Chloride: 101 mmol/L (ref 98–111)
Creatinine, Ser: 1.12 mg/dL — ABNORMAL HIGH (ref 0.44–1.00)
GFR calc Af Amer: >60 mL/min (ref >60)
GFR calc non Af Amer: 53 mL/min — ABNORMAL LOW (ref >60)
Glucose, Bld: 106 mg/dL — ABNORMAL HIGH (ref 70–99)
Potassium: 4 mmol/L (ref 3.5–5.1)
Sodium: 143 mmol/L (ref 135–145)
Total Bilirubin: 0.3 mg/dL (ref 0.3–1.2)
Total Protein: 7.5 g/dL (ref 6.5–8.1)

## 2019-06-15 LAB — CBC WITH DIFFERENTIAL/PLATELET
Abs Immature Granulocytes: 0.02 10*3/uL (ref 0.00–0.07)
Basophils Absolute: 0.1 10*3/uL (ref 0.0–0.1)
Basophils Relative: 2 %
Eosinophils Absolute: 0.1 10*3/uL (ref 0.0–0.5)
Eosinophils Relative: 2 %
HCT: 31.4 % — ABNORMAL LOW (ref 36.0–46.0)
Hemoglobin: 10.1 g/dL — ABNORMAL LOW (ref 12.0–15.0)
Immature Granulocytes: 1 %
Lymphocytes Relative: 33 %
Lymphs Abs: 1.2 10*3/uL (ref 0.7–4.0)
MCH: 35.6 pg — ABNORMAL HIGH (ref 26.0–34.0)
MCHC: 32.2 g/dL (ref 30.0–36.0)
MCV: 110.6 fL — ABNORMAL HIGH (ref 80.0–100.0)
Monocytes Absolute: 0.1 10*3/uL (ref 0.1–1.0)
Monocytes Relative: 4 %
Neutro Abs: 2.1 10*3/uL (ref 1.7–7.7)
Neutrophils Relative %: 58 %
Platelets: 417 10*3/uL — ABNORMAL HIGH (ref 150–400)
RBC: 2.84 MIL/uL — ABNORMAL LOW (ref 3.87–5.11)
RDW: 16.7 % — ABNORMAL HIGH (ref 11.5–15.5)
WBC: 3.6 10*3/uL — ABNORMAL LOW (ref 4.0–10.5)
nRBC: 0 % (ref 0.0–0.2)

## 2019-06-15 MED ORDER — DENOSUMAB 120 MG/1.7ML ~~LOC~~ SOLN
120.0000 mg | Freq: Once | SUBCUTANEOUS | Status: AC
  Administered 2019-06-15: 120 mg via SUBCUTANEOUS

## 2019-06-15 MED ORDER — FLUCONAZOLE 100 MG PO TABS: 100.0000 mg | ORAL_TABLET | Freq: Every day | ORAL | 0 refills | Status: DC

## 2019-06-15 MED ORDER — DENOSUMAB 120 MG/1.7ML ~~LOC~~ SOLN
SUBCUTANEOUS | Status: AC
  Filled 2019-06-15: qty 1.7

## 2019-06-15 NOTE — Patient Instructions (Signed)
Denosumab injection What is this medicine? DENOSUMAB (den oh sue mab) slows bone breakdown. Prolia is used to treat osteoporosis in women after menopause and in men, and in people who are taking corticosteroids for 6 months or more. Xgeva is used to treat a high calcium level due to cancer and to prevent bone fractures and other bone problems caused by multiple myeloma or cancer bone metastases. Xgeva is also used to treat giant cell tumor of the bone. This medicine may be used for other purposes; ask your health care provider or pharmacist if you have questions. COMMON BRAND NAME(S): Prolia, XGEVA What should I tell my health care provider before I take this medicine? They need to know if you have any of these conditions:  dental disease  having surgery or tooth extraction  infection  kidney disease  low levels of calcium or Vitamin D in the blood  malnutrition  on hemodialysis  skin conditions or sensitivity  thyroid or parathyroid disease  an unusual reaction to denosumab, other medicines, foods, dyes, or preservatives  pregnant or trying to get pregnant  breast-feeding How should I use this medicine? This medicine is for injection under the skin. It is given by a health care professional in a hospital or clinic setting. A special MedGuide will be given to you before each treatment. Be sure to read this information carefully each time. For Prolia, talk to your pediatrician regarding the use of this medicine in children. Special care may be needed. For Xgeva, talk to your pediatrician regarding the use of this medicine in children. While this drug may be prescribed for children as young as 13 years for selected conditions, precautions do apply. Overdosage: If you think you have taken too much of this medicine contact a poison control center or emergency room at once. NOTE: This medicine is only for you. Do not share this medicine with others. What if I miss a dose? It is  important not to miss your dose. Call your doctor or health care professional if you are unable to keep an appointment. What may interact with this medicine? Do not take this medicine with any of the following medications:  other medicines containing denosumab This medicine may also interact with the following medications:  medicines that lower your chance of fighting infection  steroid medicines like prednisone or cortisone This list may not describe all possible interactions. Give your health care provider a list of all the medicines, herbs, non-prescription drugs, or dietary supplements you use. Also tell them if you smoke, drink alcohol, or use illegal drugs. Some items may interact with your medicine. What should I watch for while using this medicine? Visit your doctor or health care professional for regular checks on your progress. Your doctor or health care professional may order blood tests and other tests to see how you are doing. Call your doctor or health care professional for advice if you get a fever, chills or sore throat, or other symptoms of a cold or flu. Do not treat yourself. This drug may decrease your body's ability to fight infection. Try to avoid being around people who are sick. You should make sure you get enough calcium and vitamin D while you are taking this medicine, unless your doctor tells you not to. Discuss the foods you eat and the vitamins you take with your health care professional. See your dentist regularly. Brush and floss your teeth as directed. Before you have any dental work done, tell your dentist you are   receiving this medicine. Do not become pregnant while taking this medicine or for 5 months after stopping it. Talk with your doctor or health care professional about your birth control options while taking this medicine. Women should inform their doctor if they wish to become pregnant or think they might be pregnant. There is a potential for serious side  effects to an unborn child. Talk to your health care professional or pharmacist for more information. What side effects may I notice from receiving this medicine? Side effects that you should report to your doctor or health care professional as soon as possible:  allergic reactions like skin rash, itching or hives, swelling of the face, lips, or tongue  bone pain  breathing problems  dizziness  jaw pain, especially after dental work  redness, blistering, peeling of the skin  signs and symptoms of infection like fever or chills; cough; sore throat; pain or trouble passing urine  signs of low calcium like fast heartbeat, muscle cramps or muscle pain; pain, tingling, numbness in the hands or feet; seizures  unusual bleeding or bruising  unusually weak or tired Side effects that usually do not require medical attention (report to your doctor or health care professional if they continue or are bothersome):  constipation  diarrhea  headache  joint pain  loss of appetite  muscle pain  runny nose  tiredness  upset stomach This list may not describe all possible side effects. Call your doctor for medical advice about side effects. You may report side effects to FDA at 1-800-FDA-1088. Where should I keep my medicine? This medicine is only given in a clinic, doctor's office, or other health care setting and will not be stored at home. NOTE: This sheet is a summary. It may not cover all possible information. If you have questions about this medicine, talk to your doctor, pharmacist, or health care provider.  2020 Elsevier/Gold Standard (2017-09-20 16:10:44)

## 2019-06-15 NOTE — Progress Notes (Signed)
Per Dr Jana Hakim ok to proceed with [facility] for Ca level of 8.7.

## 2019-06-16 DIAGNOSIS — J90 Pleural effusion, not elsewhere classified: Secondary | ICD-10-CM | POA: Diagnosis not present

## 2019-06-16 DIAGNOSIS — C50811 Malignant neoplasm of overlapping sites of right female breast: Secondary | ICD-10-CM | POA: Diagnosis not present

## 2019-06-16 DIAGNOSIS — C7951 Secondary malignant neoplasm of bone: Secondary | ICD-10-CM | POA: Diagnosis not present

## 2019-06-16 DIAGNOSIS — G893 Neoplasm related pain (acute) (chronic): Secondary | ICD-10-CM | POA: Diagnosis not present

## 2019-06-16 DIAGNOSIS — D649 Anemia, unspecified: Secondary | ICD-10-CM | POA: Diagnosis not present

## 2019-06-16 DIAGNOSIS — C7801 Secondary malignant neoplasm of right lung: Secondary | ICD-10-CM | POA: Diagnosis not present

## 2019-06-16 DIAGNOSIS — R0602 Shortness of breath: Secondary | ICD-10-CM | POA: Diagnosis not present

## 2019-06-16 LAB — CANCER ANTIGEN 27.29: CA 27.29: 41.6 U/mL — ABNORMAL HIGH (ref 0.0–38.6)

## 2019-06-17 DIAGNOSIS — C7801 Secondary malignant neoplasm of right lung: Secondary | ICD-10-CM | POA: Diagnosis not present

## 2019-06-17 DIAGNOSIS — C7951 Secondary malignant neoplasm of bone: Secondary | ICD-10-CM | POA: Diagnosis not present

## 2019-06-17 DIAGNOSIS — G893 Neoplasm related pain (acute) (chronic): Secondary | ICD-10-CM | POA: Diagnosis not present

## 2019-06-17 DIAGNOSIS — D649 Anemia, unspecified: Secondary | ICD-10-CM | POA: Diagnosis not present

## 2019-06-17 DIAGNOSIS — J90 Pleural effusion, not elsewhere classified: Secondary | ICD-10-CM | POA: Diagnosis not present

## 2019-06-17 DIAGNOSIS — C50811 Malignant neoplasm of overlapping sites of right female breast: Secondary | ICD-10-CM | POA: Diagnosis not present

## 2019-06-17 DIAGNOSIS — R0602 Shortness of breath: Secondary | ICD-10-CM | POA: Diagnosis not present

## 2019-06-17 NOTE — Telephone Encounter (Signed)
No entry 

## 2019-06-18 ENCOUNTER — Telehealth: Payer: Self-pay

## 2019-06-18 NOTE — Telephone Encounter (Signed)
Signed CMN for O2 concentrator faxed to [facility]

## 2019-06-19 DIAGNOSIS — C50811 Malignant neoplasm of overlapping sites of right female breast: Secondary | ICD-10-CM | POA: Diagnosis not present

## 2019-06-19 DIAGNOSIS — E669 Obesity, unspecified: Secondary | ICD-10-CM | POA: Diagnosis not present

## 2019-06-19 DIAGNOSIS — D630 Anemia in neoplastic disease: Secondary | ICD-10-CM | POA: Diagnosis not present

## 2019-06-19 DIAGNOSIS — R0602 Shortness of breath: Secondary | ICD-10-CM | POA: Diagnosis not present

## 2019-06-19 DIAGNOSIS — M84522D Pathological fracture in neoplastic disease, left humerus, subsequent encounter for fracture with routine healing: Secondary | ICD-10-CM | POA: Diagnosis not present

## 2019-06-19 DIAGNOSIS — C7801 Secondary malignant neoplasm of right lung: Secondary | ICD-10-CM | POA: Diagnosis not present

## 2019-06-19 DIAGNOSIS — D649 Anemia, unspecified: Secondary | ICD-10-CM | POA: Diagnosis not present

## 2019-06-19 DIAGNOSIS — M8458XD Pathological fracture in neoplastic disease, other specified site, subsequent encounter for fracture with routine healing: Secondary | ICD-10-CM | POA: Diagnosis not present

## 2019-06-19 DIAGNOSIS — M47816 Spondylosis without myelopathy or radiculopathy, lumbar region: Secondary | ICD-10-CM | POA: Diagnosis not present

## 2019-06-19 DIAGNOSIS — J9611 Chronic respiratory failure with hypoxia: Secondary | ICD-10-CM | POA: Diagnosis not present

## 2019-06-19 DIAGNOSIS — Z170 Estrogen receptor positive status [ER+]: Secondary | ICD-10-CM | POA: Diagnosis not present

## 2019-06-19 DIAGNOSIS — K5900 Constipation, unspecified: Secondary | ICD-10-CM | POA: Diagnosis not present

## 2019-06-19 DIAGNOSIS — I10 Essential (primary) hypertension: Secondary | ICD-10-CM | POA: Diagnosis not present

## 2019-06-19 DIAGNOSIS — J90 Pleural effusion, not elsewhere classified: Secondary | ICD-10-CM | POA: Diagnosis not present

## 2019-06-19 DIAGNOSIS — G893 Neoplasm related pain (acute) (chronic): Secondary | ICD-10-CM | POA: Diagnosis not present

## 2019-06-19 DIAGNOSIS — M810 Age-related osteoporosis without current pathological fracture: Secondary | ICD-10-CM | POA: Diagnosis not present

## 2019-06-19 DIAGNOSIS — Z9181 History of falling: Secondary | ICD-10-CM | POA: Diagnosis not present

## 2019-06-19 DIAGNOSIS — C7951 Secondary malignant neoplasm of bone: Secondary | ICD-10-CM | POA: Diagnosis not present

## 2019-06-22 ENCOUNTER — Other Ambulatory Visit: Payer: Self-pay | Admitting: Adult Health

## 2019-06-22 DIAGNOSIS — G893 Neoplasm related pain (acute) (chronic): Secondary | ICD-10-CM | POA: Diagnosis not present

## 2019-06-22 DIAGNOSIS — D649 Anemia, unspecified: Secondary | ICD-10-CM | POA: Diagnosis not present

## 2019-06-22 DIAGNOSIS — J90 Pleural effusion, not elsewhere classified: Secondary | ICD-10-CM | POA: Diagnosis not present

## 2019-06-22 DIAGNOSIS — C7801 Secondary malignant neoplasm of right lung: Secondary | ICD-10-CM

## 2019-06-22 DIAGNOSIS — R0602 Shortness of breath: Secondary | ICD-10-CM | POA: Diagnosis not present

## 2019-06-22 DIAGNOSIS — C50811 Malignant neoplasm of overlapping sites of right female breast: Secondary | ICD-10-CM | POA: Diagnosis not present

## 2019-06-22 DIAGNOSIS — C7951 Secondary malignant neoplasm of bone: Secondary | ICD-10-CM | POA: Diagnosis not present

## 2019-06-22 DIAGNOSIS — C50911 Malignant neoplasm of unspecified site of right female breast: Secondary | ICD-10-CM

## 2019-06-23 DIAGNOSIS — K5900 Constipation, unspecified: Secondary | ICD-10-CM | POA: Diagnosis not present

## 2019-06-23 DIAGNOSIS — C7801 Secondary malignant neoplasm of right lung: Secondary | ICD-10-CM | POA: Diagnosis not present

## 2019-06-23 DIAGNOSIS — E669 Obesity, unspecified: Secondary | ICD-10-CM | POA: Diagnosis not present

## 2019-06-23 DIAGNOSIS — I10 Essential (primary) hypertension: Secondary | ICD-10-CM | POA: Diagnosis not present

## 2019-06-23 DIAGNOSIS — D630 Anemia in neoplastic disease: Secondary | ICD-10-CM | POA: Diagnosis not present

## 2019-06-23 DIAGNOSIS — C7951 Secondary malignant neoplasm of bone: Secondary | ICD-10-CM | POA: Diagnosis not present

## 2019-06-23 DIAGNOSIS — M810 Age-related osteoporosis without current pathological fracture: Secondary | ICD-10-CM | POA: Diagnosis not present

## 2019-06-23 DIAGNOSIS — Z170 Estrogen receptor positive status [ER+]: Secondary | ICD-10-CM | POA: Diagnosis not present

## 2019-06-23 DIAGNOSIS — M84522D Pathological fracture in neoplastic disease, left humerus, subsequent encounter for fracture with routine healing: Secondary | ICD-10-CM | POA: Diagnosis not present

## 2019-06-23 DIAGNOSIS — J9611 Chronic respiratory failure with hypoxia: Secondary | ICD-10-CM | POA: Diagnosis not present

## 2019-06-23 DIAGNOSIS — R0602 Shortness of breath: Secondary | ICD-10-CM | POA: Diagnosis not present

## 2019-06-23 DIAGNOSIS — Z9181 History of falling: Secondary | ICD-10-CM | POA: Diagnosis not present

## 2019-06-23 DIAGNOSIS — C50811 Malignant neoplasm of overlapping sites of right female breast: Secondary | ICD-10-CM | POA: Diagnosis not present

## 2019-06-23 DIAGNOSIS — M47816 Spondylosis without myelopathy or radiculopathy, lumbar region: Secondary | ICD-10-CM | POA: Diagnosis not present

## 2019-06-23 DIAGNOSIS — M8458XD Pathological fracture in neoplastic disease, other specified site, subsequent encounter for fracture with routine healing: Secondary | ICD-10-CM | POA: Diagnosis not present

## 2019-06-23 DIAGNOSIS — D649 Anemia, unspecified: Secondary | ICD-10-CM | POA: Diagnosis not present

## 2019-06-23 DIAGNOSIS — J90 Pleural effusion, not elsewhere classified: Secondary | ICD-10-CM | POA: Diagnosis not present

## 2019-06-23 DIAGNOSIS — G893 Neoplasm related pain (acute) (chronic): Secondary | ICD-10-CM | POA: Diagnosis not present

## 2019-06-24 DIAGNOSIS — R0602 Shortness of breath: Secondary | ICD-10-CM | POA: Diagnosis not present

## 2019-06-24 DIAGNOSIS — C7951 Secondary malignant neoplasm of bone: Secondary | ICD-10-CM | POA: Diagnosis not present

## 2019-06-24 DIAGNOSIS — J90 Pleural effusion, not elsewhere classified: Secondary | ICD-10-CM | POA: Diagnosis not present

## 2019-06-24 DIAGNOSIS — C50811 Malignant neoplasm of overlapping sites of right female breast: Secondary | ICD-10-CM | POA: Diagnosis not present

## 2019-06-24 DIAGNOSIS — C7801 Secondary malignant neoplasm of right lung: Secondary | ICD-10-CM | POA: Diagnosis not present

## 2019-06-24 DIAGNOSIS — D649 Anemia, unspecified: Secondary | ICD-10-CM | POA: Diagnosis not present

## 2019-06-24 DIAGNOSIS — G893 Neoplasm related pain (acute) (chronic): Secondary | ICD-10-CM | POA: Diagnosis not present

## 2019-06-25 DIAGNOSIS — J90 Pleural effusion, not elsewhere classified: Secondary | ICD-10-CM | POA: Diagnosis not present

## 2019-06-25 DIAGNOSIS — G893 Neoplasm related pain (acute) (chronic): Secondary | ICD-10-CM | POA: Diagnosis not present

## 2019-06-25 DIAGNOSIS — C50811 Malignant neoplasm of overlapping sites of right female breast: Secondary | ICD-10-CM | POA: Diagnosis not present

## 2019-06-25 DIAGNOSIS — C7801 Secondary malignant neoplasm of right lung: Secondary | ICD-10-CM | POA: Diagnosis not present

## 2019-06-25 DIAGNOSIS — R0602 Shortness of breath: Secondary | ICD-10-CM | POA: Diagnosis not present

## 2019-06-25 DIAGNOSIS — D649 Anemia, unspecified: Secondary | ICD-10-CM | POA: Diagnosis not present

## 2019-06-25 DIAGNOSIS — C7951 Secondary malignant neoplasm of bone: Secondary | ICD-10-CM | POA: Diagnosis not present

## 2019-06-26 DIAGNOSIS — D649 Anemia, unspecified: Secondary | ICD-10-CM | POA: Diagnosis not present

## 2019-06-26 DIAGNOSIS — C7801 Secondary malignant neoplasm of right lung: Secondary | ICD-10-CM | POA: Diagnosis not present

## 2019-06-26 DIAGNOSIS — G893 Neoplasm related pain (acute) (chronic): Secondary | ICD-10-CM | POA: Diagnosis not present

## 2019-06-26 DIAGNOSIS — R0602 Shortness of breath: Secondary | ICD-10-CM | POA: Diagnosis not present

## 2019-06-26 DIAGNOSIS — J90 Pleural effusion, not elsewhere classified: Secondary | ICD-10-CM | POA: Diagnosis not present

## 2019-06-26 DIAGNOSIS — C50811 Malignant neoplasm of overlapping sites of right female breast: Secondary | ICD-10-CM | POA: Diagnosis not present

## 2019-06-26 DIAGNOSIS — C7951 Secondary malignant neoplasm of bone: Secondary | ICD-10-CM | POA: Diagnosis not present

## 2019-06-29 ENCOUNTER — Telehealth: Payer: Self-pay | Admitting: *Deleted

## 2019-06-29 DIAGNOSIS — C7801 Secondary malignant neoplasm of right lung: Secondary | ICD-10-CM | POA: Diagnosis not present

## 2019-06-29 DIAGNOSIS — R0602 Shortness of breath: Secondary | ICD-10-CM | POA: Diagnosis not present

## 2019-06-29 DIAGNOSIS — D649 Anemia, unspecified: Secondary | ICD-10-CM | POA: Diagnosis not present

## 2019-06-29 DIAGNOSIS — S22000A Wedge compression fracture of unspecified thoracic vertebra, initial encounter for closed fracture: Secondary | ICD-10-CM | POA: Diagnosis not present

## 2019-06-29 DIAGNOSIS — C50811 Malignant neoplasm of overlapping sites of right female breast: Secondary | ICD-10-CM | POA: Diagnosis not present

## 2019-06-29 DIAGNOSIS — J90 Pleural effusion, not elsewhere classified: Secondary | ICD-10-CM | POA: Diagnosis not present

## 2019-06-29 DIAGNOSIS — C7951 Secondary malignant neoplasm of bone: Secondary | ICD-10-CM | POA: Diagnosis not present

## 2019-06-29 DIAGNOSIS — G893 Neoplasm related pain (acute) (chronic): Secondary | ICD-10-CM | POA: Diagnosis not present

## 2019-06-29 DIAGNOSIS — J9621 Acute and chronic respiratory failure with hypoxia: Secondary | ICD-10-CM | POA: Diagnosis not present

## 2019-06-29 NOTE — Telephone Encounter (Signed)
Pt's daughter, [name] called to follow up on possibility of decreasing current lasix dose of 40 mg bid.  " it is really making her pee a lot- all the time "  Of note dose was increased to above in June of 2020 due to fluid overload.  Pt has been doing well with this issue resolved.  Per MD review- recommended for pt to take 40 mg in am only.  Informed [name] of above.

## 2019-06-30 DIAGNOSIS — Z4789 Encounter for other orthopedic aftercare: Secondary | ICD-10-CM | POA: Diagnosis not present

## 2019-07-01 ENCOUNTER — Telehealth: Payer: Self-pay | Admitting: *Deleted

## 2019-07-01 DIAGNOSIS — G893 Neoplasm related pain (acute) (chronic): Secondary | ICD-10-CM | POA: Diagnosis not present

## 2019-07-01 DIAGNOSIS — C50811 Malignant neoplasm of overlapping sites of right female breast: Secondary | ICD-10-CM | POA: Diagnosis not present

## 2019-07-01 DIAGNOSIS — J90 Pleural effusion, not elsewhere classified: Secondary | ICD-10-CM | POA: Diagnosis not present

## 2019-07-01 DIAGNOSIS — C7951 Secondary malignant neoplasm of bone: Secondary | ICD-10-CM | POA: Diagnosis not present

## 2019-07-01 DIAGNOSIS — C7801 Secondary malignant neoplasm of right lung: Secondary | ICD-10-CM | POA: Diagnosis not present

## 2019-07-01 DIAGNOSIS — R0602 Shortness of breath: Secondary | ICD-10-CM | POA: Diagnosis not present

## 2019-07-01 DIAGNOSIS — D649 Anemia, unspecified: Secondary | ICD-10-CM | POA: Diagnosis not present

## 2019-07-01 NOTE — Telephone Encounter (Addendum)
This RN spoke with pt's daughter- [name] - who states patient was seen yesterday by orthopedist- Dr Veverly Fells regarding arm.  He confirms concerns per his exam and xrays that pt would benefit from radiation [name] states Dr Jannifer Rodney was stating pt may need radiation to her arm for better healing)  [name] is wondering if they could get an appointment with radiation on the same day the pt is coming in for MD and injection- February 16.  This RN will send request per above for coordination of care due to pt's limited transportation.  Note pt has been seen by Dr Pearlie Oyster in Radiation Oncology - this note will be sent to her for review and scheduling of appointment as well as to Dr Jana Hakim  Besides an appointment with Rad Onc - pt has no further needs at this time.

## 2019-07-02 DIAGNOSIS — J9611 Chronic respiratory failure with hypoxia: Secondary | ICD-10-CM | POA: Diagnosis not present

## 2019-07-02 DIAGNOSIS — R0602 Shortness of breath: Secondary | ICD-10-CM | POA: Diagnosis not present

## 2019-07-02 DIAGNOSIS — M8458XD Pathological fracture in neoplastic disease, other specified site, subsequent encounter for fracture with routine healing: Secondary | ICD-10-CM | POA: Diagnosis not present

## 2019-07-02 DIAGNOSIS — Z170 Estrogen receptor positive status [ER+]: Secondary | ICD-10-CM | POA: Diagnosis not present

## 2019-07-02 DIAGNOSIS — E669 Obesity, unspecified: Secondary | ICD-10-CM | POA: Diagnosis not present

## 2019-07-02 DIAGNOSIS — M810 Age-related osteoporosis without current pathological fracture: Secondary | ICD-10-CM | POA: Diagnosis not present

## 2019-07-02 DIAGNOSIS — Z9181 History of falling: Secondary | ICD-10-CM | POA: Diagnosis not present

## 2019-07-02 DIAGNOSIS — I10 Essential (primary) hypertension: Secondary | ICD-10-CM | POA: Diagnosis not present

## 2019-07-02 DIAGNOSIS — C7801 Secondary malignant neoplasm of right lung: Secondary | ICD-10-CM | POA: Diagnosis not present

## 2019-07-02 DIAGNOSIS — D649 Anemia, unspecified: Secondary | ICD-10-CM | POA: Diagnosis not present

## 2019-07-02 DIAGNOSIS — K5900 Constipation, unspecified: Secondary | ICD-10-CM | POA: Diagnosis not present

## 2019-07-02 DIAGNOSIS — M84522D Pathological fracture in neoplastic disease, left humerus, subsequent encounter for fracture with routine healing: Secondary | ICD-10-CM | POA: Diagnosis not present

## 2019-07-02 DIAGNOSIS — M47816 Spondylosis without myelopathy or radiculopathy, lumbar region: Secondary | ICD-10-CM | POA: Diagnosis not present

## 2019-07-02 DIAGNOSIS — C7951 Secondary malignant neoplasm of bone: Secondary | ICD-10-CM | POA: Diagnosis not present

## 2019-07-02 DIAGNOSIS — D630 Anemia in neoplastic disease: Secondary | ICD-10-CM | POA: Diagnosis not present

## 2019-07-02 DIAGNOSIS — C50811 Malignant neoplasm of overlapping sites of right female breast: Secondary | ICD-10-CM | POA: Diagnosis not present

## 2019-07-02 DIAGNOSIS — J90 Pleural effusion, not elsewhere classified: Secondary | ICD-10-CM | POA: Diagnosis not present

## 2019-07-02 DIAGNOSIS — G893 Neoplasm related pain (acute) (chronic): Secondary | ICD-10-CM | POA: Diagnosis not present

## 2019-07-06 DIAGNOSIS — G893 Neoplasm related pain (acute) (chronic): Secondary | ICD-10-CM | POA: Diagnosis not present

## 2019-07-06 DIAGNOSIS — D649 Anemia, unspecified: Secondary | ICD-10-CM | POA: Diagnosis not present

## 2019-07-06 DIAGNOSIS — J90 Pleural effusion, not elsewhere classified: Secondary | ICD-10-CM | POA: Diagnosis not present

## 2019-07-06 DIAGNOSIS — C7801 Secondary malignant neoplasm of right lung: Secondary | ICD-10-CM | POA: Diagnosis not present

## 2019-07-06 DIAGNOSIS — R0602 Shortness of breath: Secondary | ICD-10-CM | POA: Diagnosis not present

## 2019-07-06 DIAGNOSIS — C50811 Malignant neoplasm of overlapping sites of right female breast: Secondary | ICD-10-CM | POA: Diagnosis not present

## 2019-07-06 DIAGNOSIS — C7951 Secondary malignant neoplasm of bone: Secondary | ICD-10-CM | POA: Diagnosis not present

## 2019-07-07 ENCOUNTER — Telehealth: Payer: Self-pay

## 2019-07-07 DIAGNOSIS — E669 Obesity, unspecified: Secondary | ICD-10-CM | POA: Diagnosis not present

## 2019-07-07 DIAGNOSIS — J90 Pleural effusion, not elsewhere classified: Secondary | ICD-10-CM | POA: Diagnosis not present

## 2019-07-07 DIAGNOSIS — I10 Essential (primary) hypertension: Secondary | ICD-10-CM | POA: Diagnosis not present

## 2019-07-07 DIAGNOSIS — K5900 Constipation, unspecified: Secondary | ICD-10-CM | POA: Diagnosis not present

## 2019-07-07 DIAGNOSIS — D649 Anemia, unspecified: Secondary | ICD-10-CM | POA: Diagnosis not present

## 2019-07-07 DIAGNOSIS — Z170 Estrogen receptor positive status [ER+]: Secondary | ICD-10-CM | POA: Diagnosis not present

## 2019-07-07 DIAGNOSIS — M47816 Spondylosis without myelopathy or radiculopathy, lumbar region: Secondary | ICD-10-CM | POA: Diagnosis not present

## 2019-07-07 DIAGNOSIS — C50811 Malignant neoplasm of overlapping sites of right female breast: Secondary | ICD-10-CM | POA: Diagnosis not present

## 2019-07-07 DIAGNOSIS — Z9181 History of falling: Secondary | ICD-10-CM | POA: Diagnosis not present

## 2019-07-07 DIAGNOSIS — D630 Anemia in neoplastic disease: Secondary | ICD-10-CM | POA: Diagnosis not present

## 2019-07-07 DIAGNOSIS — M84522D Pathological fracture in neoplastic disease, left humerus, subsequent encounter for fracture with routine healing: Secondary | ICD-10-CM | POA: Diagnosis not present

## 2019-07-07 DIAGNOSIS — M810 Age-related osteoporosis without current pathological fracture: Secondary | ICD-10-CM | POA: Diagnosis not present

## 2019-07-07 DIAGNOSIS — C7801 Secondary malignant neoplasm of right lung: Secondary | ICD-10-CM | POA: Diagnosis not present

## 2019-07-07 DIAGNOSIS — C7951 Secondary malignant neoplasm of bone: Secondary | ICD-10-CM | POA: Diagnosis not present

## 2019-07-07 DIAGNOSIS — G893 Neoplasm related pain (acute) (chronic): Secondary | ICD-10-CM | POA: Diagnosis not present

## 2019-07-07 DIAGNOSIS — J9611 Chronic respiratory failure with hypoxia: Secondary | ICD-10-CM | POA: Diagnosis not present

## 2019-07-07 DIAGNOSIS — R0602 Shortness of breath: Secondary | ICD-10-CM | POA: Diagnosis not present

## 2019-07-07 DIAGNOSIS — M8458XD Pathological fracture in neoplastic disease, other specified site, subsequent encounter for fracture with routine healing: Secondary | ICD-10-CM | POA: Diagnosis not present

## 2019-07-07 NOTE — Telephone Encounter (Signed)
CMN for hospital bed faxed to [place]

## 2019-07-08 ENCOUNTER — Other Ambulatory Visit: Payer: Self-pay | Admitting: Adult Health

## 2019-07-08 DIAGNOSIS — C7951 Secondary malignant neoplasm of bone: Secondary | ICD-10-CM | POA: Diagnosis not present

## 2019-07-08 DIAGNOSIS — C50811 Malignant neoplasm of overlapping sites of right female breast: Secondary | ICD-10-CM | POA: Diagnosis not present

## 2019-07-08 DIAGNOSIS — G893 Neoplasm related pain (acute) (chronic): Secondary | ICD-10-CM | POA: Diagnosis not present

## 2019-07-08 DIAGNOSIS — D649 Anemia, unspecified: Secondary | ICD-10-CM | POA: Diagnosis not present

## 2019-07-08 DIAGNOSIS — J90 Pleural effusion, not elsewhere classified: Secondary | ICD-10-CM | POA: Diagnosis not present

## 2019-07-08 DIAGNOSIS — C7801 Secondary malignant neoplasm of right lung: Secondary | ICD-10-CM | POA: Diagnosis not present

## 2019-07-08 DIAGNOSIS — R0602 Shortness of breath: Secondary | ICD-10-CM | POA: Diagnosis not present

## 2019-07-09 DIAGNOSIS — R0602 Shortness of breath: Secondary | ICD-10-CM | POA: Diagnosis not present

## 2019-07-09 DIAGNOSIS — C50811 Malignant neoplasm of overlapping sites of right female breast: Secondary | ICD-10-CM | POA: Diagnosis not present

## 2019-07-09 DIAGNOSIS — G893 Neoplasm related pain (acute) (chronic): Secondary | ICD-10-CM | POA: Diagnosis not present

## 2019-07-09 DIAGNOSIS — J90 Pleural effusion, not elsewhere classified: Secondary | ICD-10-CM | POA: Diagnosis not present

## 2019-07-09 DIAGNOSIS — D649 Anemia, unspecified: Secondary | ICD-10-CM | POA: Diagnosis not present

## 2019-07-09 DIAGNOSIS — C7801 Secondary malignant neoplasm of right lung: Secondary | ICD-10-CM | POA: Diagnosis not present

## 2019-07-09 DIAGNOSIS — C7951 Secondary malignant neoplasm of bone: Secondary | ICD-10-CM | POA: Diagnosis not present

## 2019-07-09 NOTE — Progress Notes (Signed)
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 She also reported at that time a 1 week history of a rash under her right breast that was foul smelling. She also had discomfort, thickening and hardening of her right breast for several months.   Sites of Visceral and Bony Metastatic Disease: right-sided inflammatory breast cancer, stage IV, involving lungs, lymph nodes and bones, as follows: (a) chest CT scan 10/26/2018 shows bilateral pleural effusions, possible lymphangitic spread of tumor, diffuse bony metastatic disease, and significant axillary mediastinal and hilar adenopathy (b) bone scan 10/27/2018 is a "near SuperScan" consistent with widespread bony metastatic disease (c) head CT with and without contrast 10/30/2018 shows no intracranial metastatic disease, multiple calvarial lesions (d) CA-27-29 on 10/27/2018 was 1810.4  (1) pleural fluid from right thoracentesis 10/28/2018 confirms malignant cells consistent with a breast primary, strongly estrogen and progesterone receptor positive, HER-2 not amplified, with an MIB-1 of 2%  Location(s) of Symptomatic Metastases: Left Humerus.   Past/Anticipated chemotherapy by medical oncology, if any:  05/18/19 Wilber Bihari NP PLAN: Suzanne [name] appears quite well and improved from prior.  I met with her and her daughter Suzanne [name] today.  Clinically there are no signs of cancer progression.  Her labs are stable, and she will continue treatment with the Anastrozole, Palbociclib and Xgeva.  We will give her the Xgeva today.  She was recommended to take Tums today for extra calcium.  Suzanne [name] is regaining her strength.  I applauded her at working on her recovery and her strength level.  She was recommended to work with PT as they recommend.  She is not having any pain,  but knows to call us if she has any new pain that occurs.    I reviewed how Suzanne [name] is doing with Dr. Jana Hakim.  He is delighted to hear that she is doing well.  She will continue her current treatment, return in 4 weeks for labs and an injection, and in 8 weeks for labs, f/u with Dr. Jana Hakim, and an injection.  She will undergo CT chest prior to the appointment with Dr. Jana Hakim, and orders were placed for that today.  I reviewed the above plan with Suzanne [name] and her daughter Suzanne [name] who verbalized understanding.  She was recommended to continue with the appropriate pandemic precautions.She knows to call for any questions that may arise between now and her next appointment.  We are happy to see her sooner if needed.  **She will see Dr. Jana Hakim later today after a CT Chest.   If Spine Met(s), symptoms, if any, include: N/A  Bowel/Bladder retention or incontinence (please describe): N/A  Numbness or weakness in extremities (please describe): N/A  Current Decadron regimen, if applicable: N/A  Ambulatory status? Walker? Wheelchair?: She uses a walker at home, but uses a wheelchair for long distances.   SAFETY ISSUES:  Prior radiation? No  Pacemaker/ICD? No  Possible current pregnancy? No  Is the patient on methotrexate? No  Current Complaints / other details:   01/09/2019  PROCEDURE PERFORMED:  Left humerus fracture open reduction internal fixation using Biomet ALPS plate. ATTENDING SURGEON:  Esmond Plants, MD

## 2019-07-10 DIAGNOSIS — J90 Pleural effusion, not elsewhere classified: Secondary | ICD-10-CM | POA: Diagnosis not present

## 2019-07-10 DIAGNOSIS — C50811 Malignant neoplasm of overlapping sites of right female breast: Secondary | ICD-10-CM | POA: Diagnosis not present

## 2019-07-10 DIAGNOSIS — R0602 Shortness of breath: Secondary | ICD-10-CM | POA: Diagnosis not present

## 2019-07-10 DIAGNOSIS — D649 Anemia, unspecified: Secondary | ICD-10-CM | POA: Diagnosis not present

## 2019-07-10 DIAGNOSIS — C7801 Secondary malignant neoplasm of right lung: Secondary | ICD-10-CM | POA: Diagnosis not present

## 2019-07-10 DIAGNOSIS — C7951 Secondary malignant neoplasm of bone: Secondary | ICD-10-CM | POA: Diagnosis not present

## 2019-07-10 DIAGNOSIS — G893 Neoplasm related pain (acute) (chronic): Secondary | ICD-10-CM | POA: Diagnosis not present

## 2019-07-13 DIAGNOSIS — J90 Pleural effusion, not elsewhere classified: Secondary | ICD-10-CM | POA: Diagnosis not present

## 2019-07-13 DIAGNOSIS — C7951 Secondary malignant neoplasm of bone: Secondary | ICD-10-CM | POA: Diagnosis not present

## 2019-07-13 DIAGNOSIS — R0602 Shortness of breath: Secondary | ICD-10-CM | POA: Diagnosis not present

## 2019-07-13 DIAGNOSIS — C50811 Malignant neoplasm of overlapping sites of right female breast: Secondary | ICD-10-CM | POA: Diagnosis not present

## 2019-07-13 DIAGNOSIS — D649 Anemia, unspecified: Secondary | ICD-10-CM | POA: Diagnosis not present

## 2019-07-13 DIAGNOSIS — C7801 Secondary malignant neoplasm of right lung: Secondary | ICD-10-CM | POA: Diagnosis not present

## 2019-07-13 DIAGNOSIS — G893 Neoplasm related pain (acute) (chronic): Secondary | ICD-10-CM | POA: Diagnosis not present

## 2019-07-13 NOTE — Progress Notes (Signed)
Radiation Oncology         [phone] ________________________________  Outpatient follow-up in person  Name: Suzanne [name] MRN: [id number]  Date: 07/14/2019  DOB: 15-Apr-1961  GL:OVFI, Ander Gaster, MD  [name], Virgie Dad, MD   REFERRING PHYSICIAN: [name], Virgie Dad, MD  DIAGNOSIS:    ICD-10-CM   1. Bone metastasis ([place])  C79.51   2. Bone metastases ([place])  C79.51    Stage IV Breast cancer with bone metastases  CHIEF COMPLAINT: Here to discuss management of left arm  HISTORY OF PRESENT ILLNESS::Suzanne [name] is a 59 y.o. female who was referred to me by medical oncology due to left upper arm pain.  Xrays showed a pathology fracture in the Left humerus. I met her in consultation in 11/2018, and we discussed post-surgical radiation therapy if possible or palliative radiation therapy.  I advised her to be seen by orthopedics because I felt that stabilization surgery would be in her best interest first.  Since then, she met with orthopedics and underwent fixation surgery of the left humerus on 01/09/2019.  I am not quite sure why she was not referred back immediately but the patient's daughter is under the understanding that she needed extra time to heal. Per conversation with Dr. Virgie Dad nurse on 07/01/2019, the patient and her daughter met with orthopedist Dr. Veverly Fells, who performed x-rays and recommended radiation therapy.  She is taking anastrozole and Palbociclib and Xgeva.  She uses a walker at home but uses a wheelchair for long distances.  She is on nasal cannula oxygen  PREVIOUS RADIATION THERAPY: No  PAST MEDICAL HISTORY:  has a past medical history of Cancer ([place]), Family history of breast cancer, Family history of colon cancer, Family history of melanoma, Family history of prostate cancer, Gallstones, Glaucoma, Mastitis, Metastatic breast cancer ([place]), and Obesity.    PAST SURGICAL HISTORY: Past Surgical History:  Procedure Laterality Date   GLAUCOMA SURGERY  late 1990's     IR THORACENTESIS ASP PLEURAL SPACE W/IMG GUIDE  10/28/2018   IR THORACENTESIS ASP PLEURAL SPACE W/IMG GUIDE  10/29/2018   OPEN REDUCTION INTERNAL FIXATION (ORIF) DISTAL RADIAL FRACTURE Left 01/09/2019   Procedure: OPEN REDUCTION INTERNAL FIXATION (ORIF) HUMERAL FRACTURE;  Surgeon: Netta Cedars, MD;  Location: [facility];  Service: Orthopedics;  Laterality: Left;    FAMILY HISTORY: family history includes Breast cancer in her sister; Colon cancer in her cousin; Heart attack in her father and mother; Heart disease in her brother and brother; Prostate cancer in her brother; Skin cancer in her brother.  SOCIAL HISTORY:  reports that she has never smoked. She has never used smokeless tobacco. She reports that she does not drink alcohol or use drugs.  ALLERGIES: Patient has no known allergies.  MEDICATIONS:  Current Outpatient Medications  Medication Sig Dispense Refill   albuterol (VENTOLIN HFA) 108 (90 Base) MCG/ACT inhaler Inhale 2 puffs into the lungs every 6 (six) hours as needed for wheezing or shortness of breath. 16 g 3   ALPRAZolam (XANAX) 0.25 MG tablet Take 1-2 tablets (0.25-0.5 mg total) by mouth every 8 (eight) hours as needed for anxiety. Prescription needed for SNF placement 5 tablet 0   anastrozole (ARIMIDEX) 1 MG tablet Take 1 tablet (1 mg total) by mouth daily. 30 tablet 3   antiseptic oral rinse (BIOTENE) LIQD 15 mLs by Mouth Rinse route as needed for dry mouth.     benzonatate (TESSALON) 200 MG capsule TAKE 1 CAPSULE BY MOUTH 3 TIMES DAILY IF NEEDED 30  capsule 0   calcium-vitamin D (OSCAL WITH D) 500-200 MG-UNIT tablet Take 1 tablet by mouth 3 (three) times daily. 90 tablet 1   ketoconazole (NIZORAL) 2 % cream APPLY EXTERNALLY TO THE AFFECTED AREA DAILY 30 g 1   loratadine (CLARITIN) 10 MG tablet Take 1 tablet (10 mg total) by mouth daily. 30 tablet 3   nystatin (MYCOSTATIN/NYSTOP) powder Apply topically 3 (three) times daily. Apply under breast area 15 g 1    polyethylene glycol (MIRALAX / GLYCOLAX) 17 g packet Take 17 g by mouth as needed for constipation.     sertraline (ZOLOFT) 50 MG tablet Two pills once per day at bedtime 60 tablet 4   carvedilol (COREG) 3.125 MG tablet Take 1 tablet (3.125 mg total) by mouth 2 (two) times daily with a meal. 60 tablet 0   fluconazole (DIFLUCAN) 100 MG tablet Take 1 tablet (100 mg total) by mouth daily. Take medication for 10 days and then take only as needed. 90 tablet 3   furosemide (LASIX) 20 MG tablet Take 1 tablet (20 mg total) by mouth daily. 90 tablet 4   palbociclib (IBRANCE) 100 MG capsule Take 1 capsule (100 mg total) by mouth daily with breakfast. Take whole with food. Take for 21 days on, 7 days off, repeat every 28 days. 21 capsule 6   senna (SENOKOT) 8.6 MG TABS tablet Take 1 tablet (8.6 mg total) by mouth 2 (two) times daily. (Patient not taking: Reported on 07/14/2019) 120 tablet 0   ZOFRAN 8 MG tablet Take 8 mg by mouth every 8 (eight) hours.     No current facility-administered medications for this encounter.    REVIEW OF SYSTEMS:  as above   PHYSICAL EXAM:  temperature is 97.8 F (36.6 C). Her blood pressure is 133/87 and her pulse is 78. Her respiration is 20 and oxygen saturation is 99%.   General: Alert and oriented, in no acute distress, sitting in a wheelchair, oxygen per nasal cannula Skin exam is notable for a yeast infection in the axillary and inframammary fold on the left, currently being treated (cream present) No evidence of obvious lymphedema in her forearms  ECOG = 3  0 - Asymptomatic (Fully active, able to carry on all predisease activities without restriction)  1 - Symptomatic but completely ambulatory (Restricted in physically strenuous activity but ambulatory and able to carry out work of a light or sedentary nature. For example, light housework, office work)  2 - Symptomatic, <50% in bed during the day (Ambulatory and capable of all self care but unable to carry  out any work activities. Up and about more than 50% of waking hours)  3 - Symptomatic, >50% in bed, but not bedbound (Capable of only limited self-care, confined to bed or chair 50% or more of waking hours)  4 - Bedbound (Completely disabled. Cannot carry on any self-care. Totally confined to bed or chair)  5 - Death   Eustace Pen MM, Creech RH, Tormey DC, et al. [phone]). "Toxicity and response criteria of the [facility]". [place] Oncol. 5 (6): 649-55   LABORATORY DATA:  Lab Results  Component Value Date   WBC 4.2 07/14/2019   HGB 9.9 (L) 07/14/2019   HCT 30.7 (L) 07/14/2019   MCV 111.6 (H) 07/14/2019   PLT 404 (H) 07/14/2019   CMP     Component Value Date/Time   NA 142 07/14/2019 1149   K 4.2 07/14/2019 1149   CL 105 07/14/2019 1149  CO2 29 07/14/2019 1149   GLUCOSE 94 07/14/2019 1149   BUN 20 07/14/2019 1149   CREATININE 0.97 07/14/2019 1149   CREATININE 0.63 12/22/2018 1219   CALCIUM 9.1 07/14/2019 1149   PROT 7.6 07/14/2019 1149   ALBUMIN 3.5 07/14/2019 1149   AST 18 07/14/2019 1149   AST 61 (H) 12/22/2018 1219   ALT 16 07/14/2019 1149   ALT 51 (H) 12/22/2018 1219   ALKPHOS 136 (H) 07/14/2019 1149   BILITOT 0.2 (L) 07/14/2019 1149   BILITOT 0.5 12/22/2018 1219   GFRNONAA >60 07/14/2019 1149   GFRNONAA >60 12/22/2018 1219   GFRAA >60 07/14/2019 1149   GFRAA >60 12/22/2018 1219         RADIOGRAPHY: CT Chest W Contrast  Result Date: 07/14/2019 CLINICAL DATA:  Breast cancer restaging EXAM: CT CHEST WITH CONTRAST TECHNIQUE: Multidetector CT imaging of the chest was performed during intravenous contrast administration. CONTRAST:  64m OMNIPAQUE IOHEXOL 300 MG/ML  SOLN COMPARISON:  12/25/2018 chest CT FINDINGS: Cardiovascular: Atherosclerotic calcification of the thoracic aorta and left anterior descending coronary artery. Mediastinum/Nodes: Moderate sized type 3 hiatal hernia. Left inferior axillary lymph node 1.6 cm in short axis on image 71/2,  formerly 0.9 cm. Index right axillary node 0.7 cm in short axis on image 55/2, previously 0.8 cm. Lungs/Pleura: Continued bandlike scarring in both lungs probably with a component of volume loss, although generally better than on 12/25/2018. Elevated right hemidiaphragm. Atelectasis in the superior segment right lower lobe is mildly worsened from prior. Trace bilateral pleural effusions, notably improved from 12/25/2018. Upper Abdomen: Multiple gallstones up to about 1.6 cm in diameter. Musculoskeletal: Diffuse osseous metastatic disease with loosely sclerotic involvement although some lytic involvement. Interval operative reduction and internal fixation of the left proximal humeral fracture compared to prior chest CT. The previous lytic fracture of the medial right clavicle now demonstrates extensive sclerotic but immature bone which is not fully fused, but clearly indicates some healing response. Progressive kyphotic angulation at T11-12 due to T11 and T12 wedge compression fractures. IMPRESSION: 1. Mild enlargement a left inferior axillary lymph node, currently 1.6 cm in short axis and previously 0.8 cm. 2. Generally improved bandlike scarring and atelectasis in both lungs. Continued elevation of the right hemidiaphragm. 3. Diffuse osseous metastatic disease, with some evidence of healing response of the pathologic right medial clavicular fracture. However, since the last chest CT there been interval compression fractures at T11 and T12 leading to kyphotic angulation at this level. 4. Trace bilateral pleural effusions, notably improved from 12/25/2018. 5. Moderate-sized type 3 hiatal hernia. 6.  Aortic Atherosclerosis (ICD10-I70.0).  Coronary atherosclerosis. 7. Cholelithiasis. Electronically Signed   By: WVan ClinesM.D.   On: 07/14/2019 15:17      IMPRESSION/PLAN: metastatic breast cancer, left humeral pathologic fracture.  She is now status post surgery and has been released for radiotherapy.  We  once again discussed the risks, benefits, and side effects of radiotherapy. I recommend radiotherapy to the left humerus for palliation and local control.  We discussed 2 fractionation schemes, 1 of which would be a 1 week regimen and the other is a 2-week regimen.  We discussed the pros and cons of both options.  They understand that if she undergoes a 2-week regimen there is less likelihood that she would need repeat radiotherapy to the humerus.  They would like to proceed with that treatment.  She will receive 30 Gy in 10 fractions.  We discussed the risks benefits and side effects of radiotherapy  including skin irritation, fatigue, lymphedema of the arm, rare injury to the soft tissues or bone in the arm.   She will continue Diflucan and antiyeast topical treatments for her skin infection.  The patient is enthusiastic about proceeding with treatment. I look forward to participating in the patient's care.  Simulation will take place today  On date of service, in total, I spent 40 minutes on this encounter.  The patient and her daughter were present in my clinic. __________________________________________   Eppie Gibson, MD  This document serves as a record of services personally performed by Eppie Gibson, MD. It was created on her behalf by Wilburn Mylar, a trained medical scribe. The creation of this record is based on the scribe's personal observations and the provider's statements to them. This document has been checked and approved by the attending provider.

## 2019-07-13 NOTE — Progress Notes (Signed)
Suzanne [name]  [phone]) [phone] [phone]   ID: SYA NESTLER DOB: 01/18/1961  MR#: [id number]  QJJ#:[id number]  Patient Care Team: Antony Blackbird, MD as PCP - General (Family Medicine) Jerrett Baldinger, Virgie Dad, MD as Consulting Physician (Oncology) Netta Cedars, MD as Consulting Physician (Orthopedic Surgery) OTHER MD:   CHIEF COMPLAINT: Stage IV estrogen receptor positive breast cancer  CURRENT TREATMENT: Anastrozole; Palbociclib; Delton See   INTERVAL HISTORY: Suzanne [name] returns today for follow-up and treatment of her metastatic cancer accompanied by her daughter Suzanne [name].  She continues on anastrozole with good tolerance.  Hot flashes are not a major issue.  Vaginal dryness is not a concern.  She thinks she is getting some leg hirsutism particularly in the medial upper thighs.  She has always had a little bit of facial hair she says and that is unchanged.  She is also taking Palbociclib with good tolerance.  Her current dose is 100 mg daily taking for 3 weeks and then off for 1 week and then repeat.  So far we have not had to proceed to any further dose reductions.  She receives [place] every 4 weeks and her last dose was on 06/15/2019.  She has no bone pain or problems with her teeth or gums related to this.  She underwent chest CT prior to her visit today.  This shows aortic atherosclerosis, coronary atherosclerosis, cholelithiasis, and a hiatal hernia.  Her bilateral pleural effusions are significantly improved as compared to July.  She has significant bone disease which appears improved except that there are new compression fractures at T11 and T12.  The atelectasis on her lungs is also improved although there is still continued elevation of the right hemidiaphragm.  There is a left inferior axillary lymph node which currently measures 1.6 cm, slightly enlarged.  Overall this suggest stable/improved disease.     REVIEW OF SYSTEMS: Zyia is not back to work and she  is applying for permanent disability.  Her daughter Suzanne [name] is doing a terrific job helping care for her mother and keeps up with all the medications.  Suzanne [name] tells me she is able to stand up and take some steps to the bathroom using only a cane.  Currently she denies any pain.  She had an ingrown toenail on the right that she wanted me to look at, and still has significant issues relating to yeast infection.  A detailed review of systems today was otherwise stable.  HISTORY OF CURRENT ILLNESS: From the original inpatient consult note:  Suzanne [name] is a 59 year old female from [place], [place] without significant past medical history with exception of obesity, remote history of gallstones, and remote history of mastitis.  The patient presented to the hospital on 10/26/2018 with a 3-week history of worsening shortness of breath and lower extremity edema.  The patient reported that her shortness of breath worsened with exertion.  Her lower extremity edema did not improve with elevation.  She also had an intermittent pr oductive cough with rare sputum production.  The patient was noted to be anemic with a hemoglobin of 4.5 with a clumped platelets noted on admission.  Stool for occult blood was positive.  Patient received 2 units of packed red blood cells.    The patient reported a rash under her breast x1 week which has been foul-smelling.  She also reported that she has some discomfort and thickening/hardness of her right breast for several months.  She states that her right breast is much larger than her left.  She notes some occasional bloody discharge from her breast once in a while.  A chest x-ray on admission showed diffuse bilateral interstitial opacity of unknown chronicity, findings could be related to interstitial inflammatory process, atypical/viral pneumonia, or possible metastatic disease.  There are also sclerotic and lytic lesions within the clavicles, bilateral ribs, shoulders which was  concerning for diffuse few skeletal metastatic disease.    This prompted a CT scan of the chest with contrast which showed bilateral axillary and mediastinal adenopathy, borderline hilar lymph nodes bilaterally, findings likely flecked metastatic lymphadenopathy, moderate bilateral pleural effusions, extensive interstitial thickening and reticulonodular opacities throughout the lungs, cannot exclude lymphangitic spread of tumor, diffuse skeletal metastases.  The patient's subsequent history is as detailed below.   PAST MEDICAL HISTORY: Past Medical History:  Diagnosis Date   Cancer [facility])    Family history of breast cancer    Family history of colon cancer    Family history of melanoma    Family history of prostate cancer    Gallstones    Glaucoma    Mastitis    reports history of recurrent mastitis   Metastatic breast cancer ([place])    Obesity     PAST SURGICAL HISTORY: Past Surgical History:  Procedure Laterality Date   GLAUCOMA SURGERY  late 1990's   IR THORACENTESIS ASP PLEURAL SPACE W/IMG GUIDE  10/28/2018   IR THORACENTESIS ASP PLEURAL SPACE W/IMG GUIDE  10/29/2018   OPEN REDUCTION INTERNAL FIXATION (ORIF) DISTAL RADIAL FRACTURE Left 01/09/2019   Procedure: OPEN REDUCTION INTERNAL FIXATION (ORIF) HUMERAL FRACTURE;  Surgeon: Netta Cedars, MD;  Location: [facility];  Service: Orthopedics;  Laterality: Left;    FAMILY HISTORY: Family History  Problem Relation Age of Onset   Breast cancer Sister        in her 69's-70s   Heart disease Brother        CABG   Heart disease Brother        CABG   Skin cancer Brother        melanoma   Colon cancer Cousin    Heart attack Mother    Heart attack Father    Prostate cancer Brother   Patient's father passed away at age 58, and her mother at 61, both from heart attacks. The patient has 8 siblings, 6 brothers and 1 sister.  Her sister was diagnosed with breast cancer. One brother has prostate cancer, another brother had  a melanoma removed.    GYNECOLOGIC HISTORY:  Menarche: 59 years old Age at first live birth: 59 years old [place] P 1 LMP age 14 Contraceptive: used for 2-3 years with no problems HRT never used  Hysterectomy? no   SOCIAL HISTORY:  Jaeleen worked for an Engineer, manufacturing systems. She is widowed. She lives at home with her daughter.  Suzanne [name], 37, who is a Psychologist, occupational at [employer] center.  The patient has no grandchildren. She is not a Ambulance person.   ADVANCED DIRECTIVES: not on file; she has the paperwork already. She intends to name her daughter as her HCPOA.   HEALTH MAINTENANCE: Social History   Tobacco Use   Smoking status: Never Smoker   Smokeless tobacco: Never Used   Tobacco comment: second hand smoke exposure  Substance Use Topics   Alcohol use: No   Drug use: No    Colonoscopy:   PAP:   Bone density:  Mammography:   No Known Allergies  Current Outpatient Medications  Medication Sig Dispense Refill   albuterol (VENTOLIN HFA)  108 (90 Base) MCG/ACT inhaler Inhale 2 puffs into the lungs every 6 (six) hours as needed for wheezing or shortness of breath. 16 g 3   ALPRAZolam (XANAX) 0.25 MG tablet Take 1-2 tablets (0.25-0.5 mg total) by mouth every 8 (eight) hours as needed for anxiety. Prescription needed for SNF placement 5 tablet 0   anastrozole (ARIMIDEX) 1 MG tablet Take 1 tablet (1 mg total) by mouth daily. 30 tablet 3   antiseptic oral rinse (BIOTENE) LIQD 15 mLs by Mouth Rinse route as needed for dry mouth.     benzonatate (TESSALON) 200 MG capsule TAKE 1 CAPSULE BY MOUTH 3 TIMES DAILY IF NEEDED 30 capsule 0   calcium-vitamin D (OSCAL WITH D) 500-200 MG-UNIT tablet Take 1 tablet by mouth 3 (three) times daily. 90 tablet 1   carvedilol (COREG) 3.125 MG tablet Take 1 tablet (3.125 mg total) by mouth 2 (two) times daily with a meal. 60 tablet 0   fluconazole (DIFLUCAN) 100 MG tablet Take 1 tablet (100 mg total) by mouth daily. Take medication for 10  days and then take only as needed. 90 tablet 3   furosemide (LASIX) 20 MG tablet Take 1 tablet (20 mg total) by mouth daily. 90 tablet 4   ketoconazole (NIZORAL) 2 % cream APPLY EXTERNALLY TO THE AFFECTED AREA DAILY 30 g 1   loratadine (CLARITIN) 10 MG tablet Take 1 tablet (10 mg total) by mouth daily. 30 tablet 3   nystatin (MYCOSTATIN/NYSTOP) powder Apply topically 3 (three) times daily. Apply under breast area 15 g 1   palbociclib (IBRANCE) 100 MG capsule Take 1 capsule (100 mg total) by mouth daily with breakfast. Take whole with food. Take for 21 days on, 7 days off, repeat every 28 days. 21 capsule 6   polyethylene glycol (MIRALAX / GLYCOLAX) 17 g packet Take 17 g by mouth as needed for constipation.     senna (SENOKOT) 8.6 MG TABS tablet Take 1 tablet (8.6 mg total) by mouth 2 (two) times daily. (Patient not taking: Reported on 07/14/2019) 120 tablet 0   sertraline (ZOLOFT) 50 MG tablet Two pills once per day at bedtime 60 tablet 4   ZOFRAN 8 MG tablet Take 8 mg by mouth every 8 (eight) hours.     No current facility-administered medications for this visit.   Facility-Administered Medications Ordered in Other Visits  Medication Dose Route Frequency Provider Last Rate Last Admin   sodium chloride (PF) 0.9 % injection              OBJECTIVE: Middle-aged white woman examined in a wheelchair  Vitals:   07/14/19 1416  BP: 115/69  Pulse: 84  Resp: 18  Temp: 97.8 F (36.6 C)  SpO2: 100%     Body mass index is 30.7 kg/m.   Wt Readings from Last 3 Encounters:  07/14/19 173 lb 4.8 oz (78.6 kg)  02/06/19 167 lb 1.7 oz (75.8 kg)  01/22/19 182 lb 12.2 oz (82.9 kg)  ECOG FS:3  Sclerae unicteric, EOMs intact Wearing a mask No cervical or supraclavicular adenopathy Lungs no rales or rhonchi Heart regular rate and rhythm Abd soft, obese, flaccid, nontender, positive bowel sounds MSK no focal spinal tenderness, significant scoliosis Neuro: nonfocal, well oriented, positive  affect Breasts/skin: The right breast is currently considerably improved from the original presentation.  In both inframammary folds there is extensive yeast infection as there is in the lower back and in the right antecubital fossa.   LAB RESULTS:  CMP  Component Value Date/Time   NA 142 07/14/2019 1149   K 4.2 07/14/2019 1149   CL 105 07/14/2019 1149   CO2 29 07/14/2019 1149   GLUCOSE 94 07/14/2019 1149   BUN 20 07/14/2019 1149   CREATININE 0.97 07/14/2019 1149   CREATININE 0.63 12/22/2018 1219   CALCIUM 9.1 07/14/2019 1149   PROT 7.6 07/14/2019 1149   ALBUMIN 3.5 07/14/2019 1149   AST 18 07/14/2019 1149   AST 61 (H) 12/22/2018 1219   ALT 16 07/14/2019 1149   ALT 51 (H) 12/22/2018 1219   ALKPHOS 136 (H) 07/14/2019 1149   BILITOT 0.2 (L) 07/14/2019 1149   BILITOT 0.5 12/22/2018 1219   GFRNONAA >60 07/14/2019 1149   GFRNONAA >60 12/22/2018 1219   GFRAA >60 07/14/2019 1149   GFRAA >60 12/22/2018 1219    No results found for: TOTALPROTELP, ALBUMINELP, A1GS, A2GS, BETS, BETA2SER, GAMS, MSPIKE, SPEI  No results found for: KPAFRELGTCHN, LAMBDASER, KAPLAMBRATIO  Lab Results  Component Value Date   WBC 4.2 07/14/2019   NEUTROABS 2.3 07/14/2019   HGB 9.9 (L) 07/14/2019   HCT 30.7 (L) 07/14/2019   MCV 111.6 (H) 07/14/2019   PLT 404 (H) 07/14/2019   No results found for: LABCA2  No components found for: TWSFKC127  No results for input(s): INR in the last 168 hours.  No results found for: LABCA2  No results found for: NTZ001  No results found for: VCB449  No results found for: QPR916  Lab Results  Component Value Date   CA2729 41.6 (H) 06/15/2019    No components found for: HGQUANT  No results found for: CEA1 / No results found for: CEA1   No results found for: AFPTUMOR  No results found for: CHROMOGRNA  No results found for: HGBA, HGBA2QUANT, HGBFQUANT, HGBSQUAN (Hemoglobinopathy evaluation)   Lab Results  Component Value Date   LDH 244 (H)  12/25/2018    Lab Results  Component Value Date   IRON 137 10/26/2018   TIBC 384 10/26/2018   IRONPCTSAT 36 (H) 10/26/2018   (Iron and TIBC)  Lab Results  Component Value Date   FERRITIN 2,306 (H) 12/25/2018    Urinalysis    Component Value Date/Time   COLORURINE STRAW (A) 01/15/2019 1150   APPEARANCEUR CLEAR 01/15/2019 1150   LABSPEC 1.008 01/15/2019 1150   PHURINE 6.0 01/15/2019 1150   GLUCOSEU NEGATIVE 01/15/2019 [place] 01/15/2019 1150   BILIRUBINUR NEGATIVE 01/15/2019 1150   KETONESUR NEGATIVE 01/15/2019 1150   PROTEINUR NEGATIVE 01/15/2019 1150   NITRITE NEGATIVE 01/15/2019 1150   LEUKOCYTESUR TRACE (A) 01/15/2019 1150    STUDIES:  CT Chest W Contrast  Result Date: 07/14/2019 CLINICAL DATA:  Breast cancer restaging EXAM: CT CHEST WITH CONTRAST TECHNIQUE: Multidetector CT imaging of the chest was performed during intravenous contrast administration. CONTRAST:  77m OMNIPAQUE IOHEXOL 300 MG/ML  SOLN COMPARISON:  12/25/2018 chest CT FINDINGS: Cardiovascular: Atherosclerotic calcification of the thoracic aorta and left anterior descending coronary artery. Mediastinum/Nodes: Moderate sized type 3 hiatal hernia. Left inferior axillary lymph node 1.6 cm in short axis on image 71/2, formerly 0.9 cm. Index right axillary node 0.7 cm in short axis on image 55/2, previously 0.8 cm. Lungs/Pleura: Continued bandlike scarring in both lungs probably with a component of volume loss, although generally better than on 12/25/2018. Elevated right hemidiaphragm. Atelectasis in the superior segment right lower lobe is mildly worsened from prior. Trace bilateral pleural effusions, notably improved from 12/25/2018. Upper Abdomen: Multiple gallstones up to about 1.6  cm in diameter. Musculoskeletal: Diffuse osseous metastatic disease with loosely sclerotic involvement although some lytic involvement. Interval operative reduction and internal fixation of the left proximal humeral fracture  compared to prior chest CT. The previous lytic fracture of the medial right clavicle now demonstrates extensive sclerotic but immature bone which is not fully fused, but clearly indicates some healing response. Progressive kyphotic angulation at T11-12 due to T11 and T12 wedge compression fractures. IMPRESSION: 1. Mild enlargement a left inferior axillary lymph node, currently 1.6 cm in short axis and previously 0.8 cm. 2. Generally improved bandlike scarring and atelectasis in both lungs. Continued elevation of the right hemidiaphragm. 3. Diffuse osseous metastatic disease, with some evidence of healing response of the pathologic right medial clavicular fracture. However, since the last chest CT there been interval compression fractures at T11 and T12 leading to kyphotic angulation at this level. 4. Trace bilateral pleural effusions, notably improved from 12/25/2018. 5. Moderate-sized type 3 hiatal hernia. 6.  Aortic Atherosclerosis (ICD10-I70.0).  Coronary atherosclerosis. 7. Cholelithiasis. Electronically Signed   By: Van Clines M.D.   On: 07/14/2019 15:17     ELIGIBLE FOR AVAILABLE RESEARCH PROTOCOL: no   ASSESSMENT: 59 y.o. [place], [place] woman presenting 10/26/2018 with right-sided inflammatory breast cancer, stage IV, involvnh lungs, lymph nodes and bones, as follows:  (a) chest CT scan 10/26/2018 shows bilateral pleural effusions, possible lymphangitic spread of tumor, diffuse bony metastatic disease, and significant axillary mediastinal and hilar adenopathy  (b) bone scan 10/27/2018 is a "near [place]" consistent with widespread bony metastatic disease  (c) head CT with and without contrast 10/30/2018 shows no intracranial metastatic disease, multiple calvarial lesions  (d) CA-27-29 on 10/27/2018 was 1810.4  (1) pleural fluid from right thoracentesis 10/28/2018 confirms malignant cells consistent with a breast primary, strongly estrogen and progesterone receptor positive,  HER-2 not amplified, with an MIB-1 of 2%  (2) anastrozole started 10/29/2018  (a) palbociclib to start 11/13/2018 at 100 mg dose  (b) denosumab/xgeva started 11/13/2018   (3) associated problems:  (a) hypoxia secondary to effusions  (b) pain from bone lesions and left humeral and vertebral compression fractures  (c) right upper extremity lymphedema  (d) anemia, requiring transfusion  (e) poor venous access  (4) genetics testing on 12/25/2018 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 STK11, TP53, TSC1, TSC2, and VHL.  The following genes were evaluated for sequence changes only: SDHA and HOXB13 c.251G>A variant only. The [facility] Melanoma Panel analyzed the following 9 genes: BAP1 BRCA2 CDK4 CDKN2A MITF POT1 PTEN RB1 Tp53.   (5) palliative radiation to the left humerus to be completed 07/31/2019  PLAN: Kerryn continues to make slow but steady clinical progress.  Her CT scan today is reassuring.  She will receive adjuvant radiation to her left humerus on a palliative basis.  It is very favorable that currently there is no significant pain and that she is trying to ambulate.  She remains high fall risk.  She is using a cane.  She is also on home oxygen.  I commended Suzanne [name] for the excellent care she is giving  her mother.  The plan for now is to continue the palbociclib and anastrozole, with monthly Xgeva and monthly labs.  I expect she will do well with the palliative radiation.  She  will return to see me in 3 months.  We will do additional scans 6 months from now  Total encounter time 35 minutes.Sarajane Jews C. Jaeda Bruso, MD Medical Oncology and Hematology [facility] [place],  [address] Tel. [phone]    Fax. [phone]   I, Wilburn Mylar, am acting as scribe for Dr. Virgie Dad. Khamryn Calderone.  I, Lurline Del MD, have reviewed the above documentation for accuracy and completeness, and I agree with the above.    *Total Encounter Time as defined by the Centers for Medicare and Medicaid Services includes, in addition to the face-to-face time of a patient visit (documented in the note above) non-face-to-face time: obtaining and reviewing outside history, ordering and reviewing medications, tests or procedures, care coordination (communications with other health care professionals or caregivers) and documentation in the medical record.

## 2019-07-14 ENCOUNTER — Encounter: Payer: Self-pay | Admitting: Radiation Oncology

## 2019-07-14 ENCOUNTER — Ambulatory Visit (HOSPITAL_COMMUNITY)
Admission: RE | Admit: 2019-07-14 | Discharge: 2019-07-14 | Disposition: A | Discharge status: Home / Self Care | Payer: Medicaid Other | Source: Ambulatory Visit | Attending: Adult Health | Admitting: Adult Health

## 2019-07-14 ENCOUNTER — Inpatient Hospital Stay (HOSPITAL_BASED_OUTPATIENT_CLINIC_OR_DEPARTMENT_OTHER): Payer: Medicaid Other | Admitting: Oncology

## 2019-07-14 ENCOUNTER — Inpatient Hospital Stay: Payer: Medicaid Other | Attending: Adult Health

## 2019-07-14 ENCOUNTER — Ambulatory Visit
Admission: RE | Admit: 2019-07-14 | Discharge: 2019-07-14 | Disposition: A | Discharge status: Home / Self Care | Payer: Medicaid Other | Source: Ambulatory Visit | Attending: Radiation Oncology | Admitting: Radiation Oncology

## 2019-07-14 ENCOUNTER — Encounter (HOSPITAL_COMMUNITY): Payer: Self-pay

## 2019-07-14 ENCOUNTER — Encounter: Payer: Self-pay | Admitting: *Deleted

## 2019-07-14 ENCOUNTER — Inpatient Hospital Stay: Payer: Medicaid Other

## 2019-07-14 ENCOUNTER — Other Ambulatory Visit: Payer: Self-pay

## 2019-07-14 ENCOUNTER — Telehealth: Payer: Self-pay

## 2019-07-14 ENCOUNTER — Telehealth: Payer: Self-pay | Admitting: Oncology

## 2019-07-14 VITALS — BP 133/87 | HR 78 | Temp 97.8°F | Resp 20

## 2019-07-14 VITALS — BP 115/69 | HR 84 | Temp 97.8°F | Resp 18 | Ht 63.0 in | Wt 173.3 lb

## 2019-07-14 DIAGNOSIS — C7800 Secondary malignant neoplasm of unspecified lung: Secondary | ICD-10-CM | POA: Insufficient documentation

## 2019-07-14 DIAGNOSIS — C7951 Secondary malignant neoplasm of bone: Secondary | ICD-10-CM

## 2019-07-14 DIAGNOSIS — C50811 Malignant neoplasm of overlapping sites of right female breast: Secondary | ICD-10-CM

## 2019-07-14 DIAGNOSIS — C50911 Malignant neoplasm of unspecified site of right female breast: Secondary | ICD-10-CM | POA: Diagnosis not present

## 2019-07-14 DIAGNOSIS — D649 Anemia, unspecified: Secondary | ICD-10-CM

## 2019-07-14 DIAGNOSIS — C779 Secondary and unspecified malignant neoplasm of lymph node, unspecified: Secondary | ICD-10-CM | POA: Diagnosis not present

## 2019-07-14 DIAGNOSIS — Z510 Encounter for antineoplastic radiation therapy: Secondary | ICD-10-CM | POA: Diagnosis not present

## 2019-07-14 DIAGNOSIS — Z170 Estrogen receptor positive status [ER+]: Secondary | ICD-10-CM | POA: Diagnosis not present

## 2019-07-14 DIAGNOSIS — C7801 Secondary malignant neoplasm of right lung: Secondary | ICD-10-CM

## 2019-07-14 DIAGNOSIS — R0602 Shortness of breath: Secondary | ICD-10-CM

## 2019-07-14 DIAGNOSIS — S22000A Wedge compression fracture of unspecified thoracic vertebra, initial encounter for closed fracture: Secondary | ICD-10-CM

## 2019-07-14 DIAGNOSIS — G893 Neoplasm related pain (acute) (chronic): Secondary | ICD-10-CM

## 2019-07-14 DIAGNOSIS — Z808 Family history of malignant neoplasm of other organs or systems: Secondary | ICD-10-CM | POA: Diagnosis not present

## 2019-07-14 DIAGNOSIS — C50919 Malignant neoplasm of unspecified site of unspecified female breast: Secondary | ICD-10-CM | POA: Diagnosis not present

## 2019-07-14 DIAGNOSIS — J90 Pleural effusion, not elsewhere classified: Secondary | ICD-10-CM

## 2019-07-14 LAB — CBC WITH DIFFERENTIAL/PLATELET
Abs Immature Granulocytes: 0.02 10*3/uL (ref 0.00–0.07)
Basophils Absolute: 0.1 10*3/uL (ref 0.0–0.1)
Basophils Relative: 3 %
Eosinophils Absolute: 0.1 10*3/uL (ref 0.0–0.5)
Eosinophils Relative: 1 %
HCT: 30.7 % — ABNORMAL LOW (ref 36.0–46.0)
Hemoglobin: 9.9 g/dL — ABNORMAL LOW (ref 12.0–15.0)
Immature Granulocytes: 1 %
Lymphocytes Relative: 34 %
Lymphs Abs: 1.4 10*3/uL (ref 0.7–4.0)
MCH: 36 pg — ABNORMAL HIGH (ref 26.0–34.0)
MCHC: 32.2 g/dL (ref 30.0–36.0)
MCV: 111.6 fL — ABNORMAL HIGH (ref 80.0–100.0)
Monocytes Absolute: 0.3 10*3/uL (ref 0.1–1.0)
Monocytes Relative: 6 %
Neutro Abs: 2.3 10*3/uL (ref 1.7–7.7)
Neutrophils Relative %: 55 %
Platelets: 404 10*3/uL — ABNORMAL HIGH (ref 150–400)
RBC: 2.75 MIL/uL — ABNORMAL LOW (ref 3.87–5.11)
RDW: 15.2 % (ref 11.5–15.5)
WBC: 4.2 10*3/uL (ref 4.0–10.5)
nRBC: 0 % (ref 0.0–0.2)

## 2019-07-14 LAB — COMPREHENSIVE METABOLIC PANEL
ALT: 16 U/L (ref 0–44)
AST: 18 U/L (ref 15–41)
Albumin: 3.5 g/dL (ref 3.5–5.0)
Alkaline Phosphatase: 136 U/L — ABNORMAL HIGH (ref 38–126)
Anion gap: 8 (ref 5–15)
BUN: 20 mg/dL (ref 6–20)
CO2: 29 mmol/L (ref 22–32)
Calcium: 9.1 mg/dL (ref 8.9–10.3)
Chloride: 105 mmol/L (ref 98–111)
Creatinine, Ser: 0.97 mg/dL (ref 0.44–1.00)
GFR calc Af Amer: >60 mL/min (ref >60)
GFR calc non Af Amer: >60 mL/min (ref >60)
Glucose, Bld: 94 mg/dL (ref 70–99)
Potassium: 4.2 mmol/L (ref 3.5–5.1)
Sodium: 142 mmol/L (ref 135–145)
Total Bilirubin: 0.2 mg/dL — ABNORMAL LOW (ref 0.3–1.2)
Total Protein: 7.6 g/dL (ref 6.5–8.1)

## 2019-07-14 MED ORDER — FLUCONAZOLE 100 MG PO TABS: 100.0000 mg | ORAL_TABLET | Freq: Every day | ORAL | 3 refills | Status: DC

## 2019-07-14 MED ORDER — SODIUM CHLORIDE (PF) 0.9 % IJ SOLN
INTRAMUSCULAR | Status: AC
  Filled 2019-07-14: qty 50

## 2019-07-14 MED ORDER — PALBOCICLIB 100 MG PO CAPS: 100.0000 mg | ORAL_CAPSULE | Freq: Every day | ORAL | 6 refills | Status: DC

## 2019-07-14 MED ORDER — FUROSEMIDE 20 MG PO TABS: 20.0000 mg | ORAL_TABLET | Freq: Every day | ORAL | 4 refills | Status: DC

## 2019-07-14 MED ORDER — IOHEXOL 300 MG/ML  SOLN
75.0000 mL | Freq: Once | INTRAMUSCULAR | Status: AC | PRN
  Administered 2019-07-14: 75 mL via INTRAVENOUS

## 2019-07-14 MED ORDER — DENOSUMAB 120 MG/1.7ML ~~LOC~~ SOLN
120.0000 mg | Freq: Once | SUBCUTANEOUS | Status: AC
  Administered 2019-07-14: 120 mg via SUBCUTANEOUS

## 2019-07-14 NOTE — Patient Instructions (Signed)
Denosumab injection What is this medicine? DENOSUMAB (den oh sue mab) slows bone breakdown. Prolia is used to treat osteoporosis in women after menopause and in men, and in people who are taking corticosteroids for 6 months or more. Xgeva is used to treat a high calcium level due to cancer and to prevent bone fractures and other bone problems caused by multiple myeloma or cancer bone metastases. Xgeva is also used to treat giant cell tumor of the bone. This medicine may be used for other purposes; ask your health care provider or pharmacist if you have questions. COMMON BRAND NAME(S): Prolia, XGEVA What should I tell my health care provider before I take this medicine? They need to know if you have any of these conditions:  dental disease  having surgery or tooth extraction  infection  kidney disease  low levels of calcium or Vitamin D in the blood  malnutrition  on hemodialysis  skin conditions or sensitivity  thyroid or parathyroid disease  an unusual reaction to denosumab, other medicines, foods, dyes, or preservatives  pregnant or trying to get pregnant  breast-feeding How should I use this medicine? This medicine is for injection under the skin. It is given by a health care professional in a hospital or clinic setting. A special MedGuide will be given to you before each treatment. Be sure to read this information carefully each time. For Prolia, talk to your pediatrician regarding the use of this medicine in children. Special care may be needed. For Xgeva, talk to your pediatrician regarding the use of this medicine in children. While this drug may be prescribed for children as young as 13 years for selected conditions, precautions do apply. Overdosage: If you think you have taken too much of this medicine contact a poison control center or emergency room at once. NOTE: This medicine is only for you. Do not share this medicine with others. What if I miss a dose? It is  important not to miss your dose. Call your doctor or health care professional if you are unable to keep an appointment. What may interact with this medicine? Do not take this medicine with any of the following medications:  other medicines containing denosumab This medicine may also interact with the following medications:  medicines that lower your chance of fighting infection  steroid medicines like prednisone or cortisone This list may not describe all possible interactions. Give your health care provider a list of all the medicines, herbs, non-prescription drugs, or dietary supplements you use. Also tell them if you smoke, drink alcohol, or use illegal drugs. Some items may interact with your medicine. What should I watch for while using this medicine? Visit your doctor or health care professional for regular checks on your progress. Your doctor or health care professional may order blood tests and other tests to see how you are doing. Call your doctor or health care professional for advice if you get a fever, chills or sore throat, or other symptoms of a cold or flu. Do not treat yourself. This drug may decrease your body's ability to fight infection. Try to avoid being around people who are sick. You should make sure you get enough calcium and vitamin D while you are taking this medicine, unless your doctor tells you not to. Discuss the foods you eat and the vitamins you take with your health care professional. See your dentist regularly. Brush and floss your teeth as directed. Before you have any dental work done, tell your dentist you are   receiving this medicine. Do not become pregnant while taking this medicine or for 5 months after stopping it. Talk with your doctor or health care professional about your birth control options while taking this medicine. Women should inform their doctor if they wish to become pregnant or think they might be pregnant. There is a potential for serious side  effects to an unborn child. Talk to your health care professional or pharmacist for more information. What side effects may I notice from receiving this medicine? Side effects that you should report to your doctor or health care professional as soon as possible:  allergic reactions like skin rash, itching or hives, swelling of the face, lips, or tongue  bone pain  breathing problems  dizziness  jaw pain, especially after dental work  redness, blistering, peeling of the skin  signs and symptoms of infection like fever or chills; cough; sore throat; pain or trouble passing urine  signs of low calcium like fast heartbeat, muscle cramps or muscle pain; pain, tingling, numbness in the hands or feet; seizures  unusual bleeding or bruising  unusually weak or tired Side effects that usually do not require medical attention (report to your doctor or health care professional if they continue or are bothersome):  constipation  diarrhea  headache  joint pain  loss of appetite  muscle pain  runny nose  tiredness  upset stomach This list may not describe all possible side effects. Call your doctor for medical advice about side effects. You may report side effects to FDA at 1-800-FDA-1088. Where should I keep my medicine? This medicine is only given in a clinic, doctor's office, or other health care setting and will not be stored at home. NOTE: This sheet is a summary. It may not cover all possible information. If you have questions about this medicine, talk to your doctor, pharmacist, or health care provider.  2020 Elsevier/Gold Standard (2017-09-20 16:10:44)

## 2019-07-14 NOTE — Telephone Encounter (Signed)
CMN for hospital bed faxed to [place] as requested

## 2019-07-14 NOTE — Progress Notes (Signed)
[place] Clinical Social Work  [place] representative received call from patients daughter, [name].  [name] expressed concern with transportation and communication with Medicaid transportation.  [name] stated they were out of [employer] passes, so she was attempting to schedule with medicaid transporttion but having difficulty.  CSW provided [place] with [employer] passes, and patient/[name] plan to continuing to use [employer] to transport patient to her upcoming radiation treatments.  [name] also expressed concern for access to pt/ot services in the home.  [name] stated services have not been approved by Medicaid, but she is unsure of the reason.  [name] stated [facility] is providing home health services, and they have been assigned a Therapist, sports and case Freight forwarder.  CSW encouraged [name] to speak with [facility] case Freight forwarder and social worker to determine what the barriers are to [facility] approving the services.  [name] also stated patients orthopedic office requested new orders on Thursday.  [name] plans to connect with [facility] staff and contact CSW with any additional questions.    Johnnye Lana, MSW, LCSW, OSW-C Clinical Social Worker [facility] [phone]

## 2019-07-14 NOTE — Progress Notes (Signed)
  Radiation Oncology         [phone] ________________________________  Name: Suzanne [name] MRN: [id number]  Date: 07/14/2019  DOB: February 17, 1961  SIMULATION AND TREATMENT PLANNING NOTE  Outpatient  DIAGNOSIS:     ICD-10-CM   1. Bone metastasis ([place])  C79.51   2. Bone metastases ([place])  C79.51     NARRATIVE:  The patient was brought to the [place].  Identity was confirmed.  All relevant records and images related to the planned course of therapy were reviewed.  The patient freely provided informed written consent to proceed with treatment after reviewing the details related to the planned course of therapy. The consent form was witnessed and verified by the simulation staff.    Then, the patient was set-up in a stable reproducible  supine position for radiation therapy.  CT images were obtained.  Surface markings were placed.  The CT images were loaded into the planning software.    TREATMENT PLANNING NOTE: Treatment planning then occurred.  The radiation prescription was entered and confirmed.    A total of 3 medically necessary complex treatment devices were fabricated and supervised by me, in the form of 2 fields with MLCs to block skin, soft tissue, heart, lungs, as well as 1 Vac Loc. MORE FIELDS WITH MLCs MAY BE ADDED IN DOSIMETRY for dose homogeneity.  I have requested : Isodose Plan.    The patient will receive 30 Gy in 10 fractions to the left humerus with AP PA beams.   -----------------------------------  Eppie Gibson, MD

## 2019-07-14 NOTE — Telephone Encounter (Signed)
I left a message regarding schedule  

## 2019-07-15 ENCOUNTER — Encounter: Payer: Self-pay | Admitting: Radiation Oncology

## 2019-07-15 ENCOUNTER — Ambulatory Visit: Payer: Medicaid Other | Attending: Family Medicine | Admitting: Family Medicine

## 2019-07-15 ENCOUNTER — Telehealth: Payer: Self-pay | Admitting: Family Medicine

## 2019-07-15 DIAGNOSIS — G893 Neoplasm related pain (acute) (chronic): Secondary | ICD-10-CM | POA: Diagnosis not present

## 2019-07-15 DIAGNOSIS — R0602 Shortness of breath: Secondary | ICD-10-CM | POA: Diagnosis not present

## 2019-07-15 DIAGNOSIS — R066 Hiccough: Secondary | ICD-10-CM | POA: Diagnosis not present

## 2019-07-15 DIAGNOSIS — Z9181 History of falling: Secondary | ICD-10-CM | POA: Diagnosis not present

## 2019-07-15 DIAGNOSIS — M47816 Spondylosis without myelopathy or radiculopathy, lumbar region: Secondary | ICD-10-CM | POA: Diagnosis not present

## 2019-07-15 DIAGNOSIS — L03031 Cellulitis of right toe: Secondary | ICD-10-CM

## 2019-07-15 DIAGNOSIS — I10 Essential (primary) hypertension: Secondary | ICD-10-CM | POA: Diagnosis not present

## 2019-07-15 DIAGNOSIS — C7951 Secondary malignant neoplasm of bone: Secondary | ICD-10-CM | POA: Diagnosis not present

## 2019-07-15 DIAGNOSIS — Z170 Estrogen receptor positive status [ER+]: Secondary | ICD-10-CM

## 2019-07-15 DIAGNOSIS — C50811 Malignant neoplasm of overlapping sites of right female breast: Secondary | ICD-10-CM

## 2019-07-15 DIAGNOSIS — Z79899 Other long term (current) drug therapy: Secondary | ICD-10-CM | POA: Diagnosis not present

## 2019-07-15 DIAGNOSIS — E669 Obesity, unspecified: Secondary | ICD-10-CM | POA: Diagnosis not present

## 2019-07-15 DIAGNOSIS — K449 Diaphragmatic hernia without obstruction or gangrene: Secondary | ICD-10-CM | POA: Diagnosis not present

## 2019-07-15 DIAGNOSIS — M810 Age-related osteoporosis without current pathological fracture: Secondary | ICD-10-CM | POA: Diagnosis not present

## 2019-07-15 DIAGNOSIS — M8458XD Pathological fracture in neoplastic disease, other specified site, subsequent encounter for fracture with routine healing: Secondary | ICD-10-CM | POA: Diagnosis not present

## 2019-07-15 DIAGNOSIS — F329 Major depressive disorder, single episode, unspecified: Secondary | ICD-10-CM | POA: Diagnosis not present

## 2019-07-15 DIAGNOSIS — C7801 Secondary malignant neoplasm of right lung: Secondary | ICD-10-CM | POA: Diagnosis not present

## 2019-07-15 DIAGNOSIS — K5900 Constipation, unspecified: Secondary | ICD-10-CM | POA: Diagnosis not present

## 2019-07-15 DIAGNOSIS — M84522D Pathological fracture in neoplastic disease, left humerus, subsequent encounter for fracture with routine healing: Secondary | ICD-10-CM | POA: Diagnosis not present

## 2019-07-15 DIAGNOSIS — J90 Pleural effusion, not elsewhere classified: Secondary | ICD-10-CM | POA: Diagnosis not present

## 2019-07-15 DIAGNOSIS — C50919 Malignant neoplasm of unspecified site of unspecified female breast: Secondary | ICD-10-CM | POA: Diagnosis not present

## 2019-07-15 DIAGNOSIS — R490 Dysphonia: Secondary | ICD-10-CM

## 2019-07-15 DIAGNOSIS — D630 Anemia in neoplastic disease: Secondary | ICD-10-CM | POA: Diagnosis not present

## 2019-07-15 DIAGNOSIS — J9611 Chronic respiratory failure with hypoxia: Secondary | ICD-10-CM | POA: Diagnosis not present

## 2019-07-15 DIAGNOSIS — D649 Anemia, unspecified: Secondary | ICD-10-CM | POA: Diagnosis not present

## 2019-07-15 DIAGNOSIS — Z7969 Long term (current) use of other immunomodulators and immunosuppressants: Secondary | ICD-10-CM

## 2019-07-15 LAB — CANCER ANTIGEN 27.29: CA 27.29: 38.8 U/mL — ABNORMAL HIGH (ref 0.0–38.6)

## 2019-07-15 MED ORDER — CEPHALEXIN 500 MG PO CAPS: 500.0000 mg | ORAL_CAPSULE | Freq: Two times a day (BID) | ORAL | 0 refills | Status: AC

## 2019-07-15 NOTE — Telephone Encounter (Signed)
I have an appointment with patient for later this afternoon by phone. Please remind me to ask about the patient's toe

## 2019-07-15 NOTE — Progress Notes (Signed)
Virtual Visit via Telephone Note  I connected with Suzanne [name] on 07/15/19 at  2:10 PM EST by telephone and verified that I am speaking with the correct person using two identifiers.   I discussed the limitations, risks, security and privacy concerns of performing an evaluation and management service by telephone and the availability of in person appointments. I also discussed with the patient that there may be a patient responsible charge related to this service. The patient expressed understanding and agreed to proceed.  Patient Location: Home Provider Location: [facility] Others participating in call: patient's daughter   History of Present Illness:        59 yo female with metastatic breast cancer followed by [facility] Hematology, Oncology who has had issues with redness surrounding the right great toe/toenail and initially persumed to be due to an ingrown toenail for which patient had been instructed by her oncologist to use soaks in antiseptic mouthwash and there was some improvement in the area of redness however her home health nurse contacted the office earlier today with concerns that patient's toe with an increased area of redness as well as increased warmth to touch.  Patient reports that she has noticed an increase in the area redness and she does have some discomfort in her toe which is about a 4 on a 0-to-10 scale.         Patient has seen speech therapy and has continued issues with dysphonia.  She does have upcoming ENT appointment.  Per daughter, when patient was recently seen by oncology, the oncologist mentioned that speech therapist thought the patient may need to be "scoped" by ENT in follow-up of her dysphonia.  Patient also per daughter has had some issues with recurrent hiccups.  Hiccups will last for about 10 minutes at a time and will resolve on their own.  Daughter wonders what may be causing her mother's hiccups to occur.  Patient does report occasional acid reflux symptoms  for which she is not currently taking any medication and she does not think that she needs a reflux medication at this time.         At the recent oncology visit, daughter states that patient was told that she is doing well.  Patient reports that she has had decreased shortness of breath.  She is not currently having any significant issues with pain.  She is able to ambulate for very short distances with the use of a walker.  For longer distances, patient needs to use a wheelchair.  Patient is scheduled to have some radiation therapy of her left upper arm and shoulder where she was status post a pathological fracture last year.  She continues to take medication to help with her metastatic breast cancer.  She does continue to have a rash beneath the left breast which is being treated.          She continues to take her Zoloft for reactive depression and she feels that this is working well at its current dose.  She continues to have a good appetite and continues to sleep well.  She does not feel as if she is having any significant issues with depression or anxiety at this time with the use of sertraline.   Past Medical History:  Diagnosis Date  . Cancer ([place])   . Family history of breast cancer   . Family history of colon cancer   . Family history of melanoma   . Family history of prostate cancer   .  Gallstones   . Glaucoma   . Mastitis    reports history of recurrent mastitis  . Metastatic breast cancer ([name])   . Obesity     Past Surgical History:  Procedure Laterality Date  . GLAUCOMA SURGERY  late 1990's  . IR THORACENTESIS ASP PLEURAL SPACE W/IMG GUIDE  10/28/2018  . IR THORACENTESIS ASP PLEURAL SPACE W/IMG GUIDE  10/29/2018  . OPEN REDUCTION INTERNAL FIXATION (ORIF) DISTAL RADIAL FRACTURE Left 01/09/2019   Procedure: OPEN REDUCTION INTERNAL FIXATION (ORIF) HUMERAL FRACTURE;  Surgeon: Netta Cedars, MD;  Location: [facility];  Service: Orthopedics;  Laterality: Left;    Family History  Problem  Relation Age of Onset  . Breast cancer Sister        in her 44's-70s  . Heart disease Brother        CABG  . Heart disease Brother        CABG  . Skin cancer Brother        melanoma  . Colon cancer Cousin   . Heart attack Mother   . Heart attack Father   . Prostate cancer Brother     Social History   Tobacco Use  . Smoking status: Never Smoker  . Smokeless tobacco: Never Used  . Tobacco comment: second hand smoke exposure  Substance Use Topics  . Alcohol use: No  . Drug use: No     No Known Allergies     Observations/Objective: No vital signs or physical exam conducted as visit was done via telephone Patient does have a somewhat weak, raspy speaking voice.  She does not have any coughing and is able to complete sentences without stopping due to shortness of breath.  Assessment and Plan: 1. Cellulitis of toe of right foot Patient, daughter and home health nurse have reported that patient has had some increase in the area of redness involving her right great toe as well as mild pain and increased warmth to the area.  Patient will be treated with Keflex 500 mg twice daily for possible cellulitis and daughter was made aware that patient will need further evaluation if she has increased redness, swelling or streaking of redness from the site of suspected infection.  If there is acute increase in redness, pain, swelling or streaking from the site, patient may need urgent care or emergency department evaluation if this occurs after office hours.  Home health will continue to monitor for changes suggestive of worsening cellulitis or to see if redness and increased warmth are not improving. - cephALEXin (KEFLEX) 500 MG capsule; Take 1 capsule (500 mg total) by mouth 2 (two) times daily for 10 days.  Dispense: 20 capsule; Refill: 0  2. Dysphonia; 3.  Hiatal hernia; 8.  Recurrent hiccups Patient with continued issues with dysphonia and has been evaluated by speech therapist.  Patient does  have upcoming ENT appointment in follow-up of hearing loss.  Daughter reports that oncologist will contact ENT with speech therapy recommendations regarding patient's dysphonia.   Also discussed that on imaging done by oncology in follow-up of patient's metastatic cancer, that patient has the presence of a large hiatal hernia and this may be causing issues with acid reflux which could also cause irritation of the larynx/vocal cord and can contribute to dysphonia.  Daughter will call if ENT evaluation reveals that patient may need medication for acid reflux or further evaluation by gastroenterology.  Patient CT scan on 07/14/2019 showed a moderate sized type III hiatal hernia which can cause chest/epigastric discomfort,  reflux symptoms, substernal chest pain as well as issues with reflux esophagitis and irritation of the vocal cords.  Also discussed that hiatal hernia/acid reflux may be contributing to patient's issues with hiccups and daughter will contact the office if this continues or worsens so that medication such as PPI therapy or other medications can be used to help with recurrent hiccups.   4. Malignant neoplasm of overlapping sites of right breast in female, estrogen receptor positive ([place]); 5.  Stage IV breast cancer; 6.  On antineoplastic chemotherapy Patient continues to undergo treatment by oncology for her stage IV breast cancer that is metastatic.  7.  Reactive depression Currently stable and controlled with the use of sertraline.   Follow Up Instructions:Return in about 8 weeks (around 09/09/2019) for chronic issues; call next week if toe is not better; .    I discussed the assessment and treatment plan with the patient. The patient was provided an opportunity to ask questions and all were answered. The patient agreed with the plan and demonstrated an understanding of the instructions.   The patient was advised to call back or seek an in-person evaluation if the symptoms worsen or if the  condition fails to improve as anticipated.  I provided 21 minutes of non-face-to-face time during this encounter.   Antony Blackbird, MD

## 2019-07-15 NOTE — Telephone Encounter (Signed)
Suzanne [name] called to inform the pcp that the patients right big toe is currently swollen, warm and painful to touch, and reddness is visible on the top of her foot. Patient has an appointment and wanted to inform pcp.

## 2019-07-17 ENCOUNTER — Other Ambulatory Visit: Payer: Self-pay | Admitting: Oncology

## 2019-07-17 DIAGNOSIS — G893 Neoplasm related pain (acute) (chronic): Secondary | ICD-10-CM | POA: Diagnosis not present

## 2019-07-17 DIAGNOSIS — C50911 Malignant neoplasm of unspecified site of right female breast: Secondary | ICD-10-CM | POA: Diagnosis not present

## 2019-07-17 DIAGNOSIS — C7801 Secondary malignant neoplasm of right lung: Secondary | ICD-10-CM | POA: Diagnosis not present

## 2019-07-17 DIAGNOSIS — R0602 Shortness of breath: Secondary | ICD-10-CM | POA: Diagnosis not present

## 2019-07-17 DIAGNOSIS — C50811 Malignant neoplasm of overlapping sites of right female breast: Secondary | ICD-10-CM | POA: Diagnosis not present

## 2019-07-17 DIAGNOSIS — C7951 Secondary malignant neoplasm of bone: Secondary | ICD-10-CM | POA: Diagnosis not present

## 2019-07-17 DIAGNOSIS — Z510 Encounter for antineoplastic radiation therapy: Secondary | ICD-10-CM | POA: Diagnosis not present

## 2019-07-17 DIAGNOSIS — D649 Anemia, unspecified: Secondary | ICD-10-CM | POA: Diagnosis not present

## 2019-07-17 DIAGNOSIS — J90 Pleural effusion, not elsewhere classified: Secondary | ICD-10-CM | POA: Diagnosis not present

## 2019-07-18 ENCOUNTER — Encounter: Payer: Self-pay | Admitting: Family Medicine

## 2019-07-20 ENCOUNTER — Other Ambulatory Visit: Payer: Self-pay

## 2019-07-20 ENCOUNTER — Ambulatory Visit
Admission: RE | Admit: 2019-07-20 | Discharge: 2019-07-20 | Disposition: A | Discharge status: Home / Self Care | Payer: Medicaid Other | Source: Ambulatory Visit | Attending: Radiation Oncology | Admitting: Radiation Oncology

## 2019-07-20 DIAGNOSIS — G893 Neoplasm related pain (acute) (chronic): Secondary | ICD-10-CM | POA: Diagnosis not present

## 2019-07-20 DIAGNOSIS — D649 Anemia, unspecified: Secondary | ICD-10-CM | POA: Diagnosis not present

## 2019-07-20 DIAGNOSIS — C7801 Secondary malignant neoplasm of right lung: Secondary | ICD-10-CM | POA: Diagnosis not present

## 2019-07-20 DIAGNOSIS — Z510 Encounter for antineoplastic radiation therapy: Secondary | ICD-10-CM | POA: Diagnosis not present

## 2019-07-20 DIAGNOSIS — R0602 Shortness of breath: Secondary | ICD-10-CM | POA: Diagnosis not present

## 2019-07-20 DIAGNOSIS — C7951 Secondary malignant neoplasm of bone: Secondary | ICD-10-CM | POA: Diagnosis not present

## 2019-07-20 DIAGNOSIS — C50911 Malignant neoplasm of unspecified site of right female breast: Secondary | ICD-10-CM | POA: Diagnosis not present

## 2019-07-20 DIAGNOSIS — C50811 Malignant neoplasm of overlapping sites of right female breast: Secondary | ICD-10-CM | POA: Diagnosis not present

## 2019-07-20 DIAGNOSIS — J90 Pleural effusion, not elsewhere classified: Secondary | ICD-10-CM | POA: Diagnosis not present

## 2019-07-21 ENCOUNTER — Ambulatory Visit: Payer: Medicaid Other

## 2019-07-21 ENCOUNTER — Ambulatory Visit
Admission: RE | Admit: 2019-07-21 | Discharge: 2019-07-21 | Disposition: A | Discharge status: Home / Self Care | Payer: Medicaid Other | Source: Ambulatory Visit | Attending: Radiation Oncology | Admitting: Radiation Oncology

## 2019-07-21 ENCOUNTER — Other Ambulatory Visit: Payer: Self-pay

## 2019-07-21 DIAGNOSIS — D649 Anemia, unspecified: Secondary | ICD-10-CM | POA: Diagnosis not present

## 2019-07-21 DIAGNOSIS — C7801 Secondary malignant neoplasm of right lung: Secondary | ICD-10-CM | POA: Diagnosis not present

## 2019-07-21 DIAGNOSIS — Z510 Encounter for antineoplastic radiation therapy: Secondary | ICD-10-CM | POA: Diagnosis not present

## 2019-07-21 DIAGNOSIS — R0602 Shortness of breath: Secondary | ICD-10-CM | POA: Diagnosis not present

## 2019-07-21 DIAGNOSIS — G893 Neoplasm related pain (acute) (chronic): Secondary | ICD-10-CM | POA: Diagnosis not present

## 2019-07-21 DIAGNOSIS — C50911 Malignant neoplasm of unspecified site of right female breast: Secondary | ICD-10-CM | POA: Diagnosis not present

## 2019-07-21 DIAGNOSIS — C50811 Malignant neoplasm of overlapping sites of right female breast: Secondary | ICD-10-CM | POA: Diagnosis not present

## 2019-07-21 DIAGNOSIS — C7951 Secondary malignant neoplasm of bone: Secondary | ICD-10-CM | POA: Diagnosis not present

## 2019-07-21 DIAGNOSIS — J90 Pleural effusion, not elsewhere classified: Secondary | ICD-10-CM | POA: Diagnosis not present

## 2019-07-22 ENCOUNTER — Ambulatory Visit: Payer: Medicaid Other

## 2019-07-22 ENCOUNTER — Other Ambulatory Visit: Payer: Self-pay

## 2019-07-22 ENCOUNTER — Ambulatory Visit: Admission: RE | Admit: 2019-07-22 | Payer: Medicaid Other | Source: Ambulatory Visit

## 2019-07-22 DIAGNOSIS — C7801 Secondary malignant neoplasm of right lung: Secondary | ICD-10-CM | POA: Diagnosis not present

## 2019-07-22 DIAGNOSIS — C50811 Malignant neoplasm of overlapping sites of right female breast: Secondary | ICD-10-CM | POA: Diagnosis not present

## 2019-07-22 DIAGNOSIS — J9611 Chronic respiratory failure with hypoxia: Secondary | ICD-10-CM | POA: Diagnosis not present

## 2019-07-22 DIAGNOSIS — D649 Anemia, unspecified: Secondary | ICD-10-CM | POA: Diagnosis not present

## 2019-07-22 DIAGNOSIS — E669 Obesity, unspecified: Secondary | ICD-10-CM | POA: Diagnosis not present

## 2019-07-22 DIAGNOSIS — C50911 Malignant neoplasm of unspecified site of right female breast: Secondary | ICD-10-CM | POA: Diagnosis not present

## 2019-07-22 DIAGNOSIS — G893 Neoplasm related pain (acute) (chronic): Secondary | ICD-10-CM | POA: Diagnosis not present

## 2019-07-22 DIAGNOSIS — Z510 Encounter for antineoplastic radiation therapy: Secondary | ICD-10-CM | POA: Diagnosis not present

## 2019-07-22 DIAGNOSIS — D630 Anemia in neoplastic disease: Secondary | ICD-10-CM | POA: Diagnosis not present

## 2019-07-22 DIAGNOSIS — K5900 Constipation, unspecified: Secondary | ICD-10-CM | POA: Diagnosis not present

## 2019-07-22 DIAGNOSIS — C7951 Secondary malignant neoplasm of bone: Secondary | ICD-10-CM | POA: Diagnosis not present

## 2019-07-22 DIAGNOSIS — J90 Pleural effusion, not elsewhere classified: Secondary | ICD-10-CM | POA: Diagnosis not present

## 2019-07-22 DIAGNOSIS — M47816 Spondylosis without myelopathy or radiculopathy, lumbar region: Secondary | ICD-10-CM | POA: Diagnosis not present

## 2019-07-22 DIAGNOSIS — I10 Essential (primary) hypertension: Secondary | ICD-10-CM | POA: Diagnosis not present

## 2019-07-22 DIAGNOSIS — Z170 Estrogen receptor positive status [ER+]: Secondary | ICD-10-CM | POA: Diagnosis not present

## 2019-07-22 DIAGNOSIS — R0602 Shortness of breath: Secondary | ICD-10-CM | POA: Diagnosis not present

## 2019-07-22 DIAGNOSIS — M810 Age-related osteoporosis without current pathological fracture: Secondary | ICD-10-CM | POA: Diagnosis not present

## 2019-07-22 DIAGNOSIS — M8458XD Pathological fracture in neoplastic disease, other specified site, subsequent encounter for fracture with routine healing: Secondary | ICD-10-CM | POA: Diagnosis not present

## 2019-07-22 DIAGNOSIS — M84522D Pathological fracture in neoplastic disease, left humerus, subsequent encounter for fracture with routine healing: Secondary | ICD-10-CM | POA: Diagnosis not present

## 2019-07-22 DIAGNOSIS — Z9181 History of falling: Secondary | ICD-10-CM | POA: Diagnosis not present

## 2019-07-23 ENCOUNTER — Telehealth: Payer: Self-pay | Admitting: *Deleted

## 2019-07-23 ENCOUNTER — Encounter: Payer: Self-pay | Admitting: General Practice

## 2019-07-23 ENCOUNTER — Telehealth: Payer: Self-pay | Admitting: Family Medicine

## 2019-07-23 ENCOUNTER — Ambulatory Visit: Payer: Medicaid Other

## 2019-07-23 ENCOUNTER — Ambulatory Visit
Admission: RE | Admit: 2019-07-23 | Discharge: 2019-07-23 | Disposition: A | Discharge status: Home / Self Care | Payer: Medicaid Other | Source: Ambulatory Visit | Attending: Radiation Oncology | Admitting: Radiation Oncology

## 2019-07-23 ENCOUNTER — Other Ambulatory Visit: Payer: Self-pay

## 2019-07-23 DIAGNOSIS — I10 Essential (primary) hypertension: Secondary | ICD-10-CM | POA: Diagnosis not present

## 2019-07-23 DIAGNOSIS — C50911 Malignant neoplasm of unspecified site of right female breast: Secondary | ICD-10-CM | POA: Diagnosis not present

## 2019-07-23 DIAGNOSIS — M8458XD Pathological fracture in neoplastic disease, other specified site, subsequent encounter for fracture with routine healing: Secondary | ICD-10-CM | POA: Diagnosis not present

## 2019-07-23 DIAGNOSIS — M47816 Spondylosis without myelopathy or radiculopathy, lumbar region: Secondary | ICD-10-CM | POA: Diagnosis not present

## 2019-07-23 DIAGNOSIS — C50811 Malignant neoplasm of overlapping sites of right female breast: Secondary | ICD-10-CM | POA: Diagnosis not present

## 2019-07-23 DIAGNOSIS — K5900 Constipation, unspecified: Secondary | ICD-10-CM | POA: Diagnosis not present

## 2019-07-23 DIAGNOSIS — C7951 Secondary malignant neoplasm of bone: Secondary | ICD-10-CM | POA: Diagnosis not present

## 2019-07-23 DIAGNOSIS — M84522D Pathological fracture in neoplastic disease, left humerus, subsequent encounter for fracture with routine healing: Secondary | ICD-10-CM | POA: Diagnosis not present

## 2019-07-23 DIAGNOSIS — E669 Obesity, unspecified: Secondary | ICD-10-CM | POA: Diagnosis not present

## 2019-07-23 DIAGNOSIS — D630 Anemia in neoplastic disease: Secondary | ICD-10-CM | POA: Diagnosis not present

## 2019-07-23 DIAGNOSIS — Z9181 History of falling: Secondary | ICD-10-CM | POA: Diagnosis not present

## 2019-07-23 DIAGNOSIS — J9611 Chronic respiratory failure with hypoxia: Secondary | ICD-10-CM | POA: Diagnosis not present

## 2019-07-23 DIAGNOSIS — M810 Age-related osteoporosis without current pathological fracture: Secondary | ICD-10-CM | POA: Diagnosis not present

## 2019-07-23 DIAGNOSIS — J90 Pleural effusion, not elsewhere classified: Secondary | ICD-10-CM | POA: Diagnosis not present

## 2019-07-23 DIAGNOSIS — Z510 Encounter for antineoplastic radiation therapy: Secondary | ICD-10-CM | POA: Diagnosis not present

## 2019-07-23 DIAGNOSIS — C7801 Secondary malignant neoplasm of right lung: Secondary | ICD-10-CM | POA: Diagnosis not present

## 2019-07-23 DIAGNOSIS — G893 Neoplasm related pain (acute) (chronic): Secondary | ICD-10-CM | POA: Diagnosis not present

## 2019-07-23 DIAGNOSIS — R0602 Shortness of breath: Secondary | ICD-10-CM | POA: Diagnosis not present

## 2019-07-23 DIAGNOSIS — Z170 Estrogen receptor positive status [ER+]: Secondary | ICD-10-CM | POA: Diagnosis not present

## 2019-07-23 DIAGNOSIS — D649 Anemia, unspecified: Secondary | ICD-10-CM | POA: Diagnosis not present

## 2019-07-23 NOTE — Progress Notes (Signed)
[place] Progress Notes  Call from patient's daughter, [name], concerned that "stability bar" had been removed from patient's wheelchair at some point during today's [facility] visit and not replaced on chair.  Concern communicated to [facility] who will investigate and call daughter.  [name], LCSW Clinical Social Worker Phone:  [phone] Cell:  [phone]

## 2019-07-23 NOTE — Telephone Encounter (Signed)
Olin Hauser with home health called to report that the patients blood pressure today was 102/60 and yesterday speech therapy reported 98/60. Olin Hauser with Home health had some questions regarding the patients medications. Please follow up at your earliest convenience with Olin Hauser. CB# [phone].

## 2019-07-23 NOTE — Telephone Encounter (Signed)
Spoke with [facility] health nurse stated patient has been having low BP readings / Today 102/60 and yesterday 98/60/ Please advice !

## 2019-07-23 NOTE — Telephone Encounter (Signed)
Please see if you can find out how heart rate has been and confirm that the only BP medication that patient is currently taking is carvedilol

## 2019-07-23 NOTE — Telephone Encounter (Signed)
Spoke with Suzanne [name] pt nurse/ Stated her pulse is between 76-78. Stated pt is taking lasix 20 mg and Coreg.

## 2019-07-23 NOTE — Telephone Encounter (Signed)
The coreg can be held for now and restart if BP at [id number] or higher systolic or 85 or higher systolic. Has she had recent increase in fluid loss such as vomiting or diarrhea? Ask daughter is lasix dose could be given as 1/2 pill daily or 20 mg every other day- concern that patient may be dehydrated. Any dizziness/light-headedness. Also have daughter contact Oncology in case they want patient to come in for labs/fluids but looks like she may have an appt there tomorrow

## 2019-07-24 ENCOUNTER — Ambulatory Visit: Payer: Medicaid Other

## 2019-07-24 ENCOUNTER — Ambulatory Visit: Payer: Medicaid Other | Attending: Family Medicine | Admitting: Family Medicine

## 2019-07-24 ENCOUNTER — Other Ambulatory Visit: Payer: Self-pay | Admitting: *Deleted

## 2019-07-24 ENCOUNTER — Telehealth: Payer: Self-pay | Admitting: General Practice

## 2019-07-24 ENCOUNTER — Ambulatory Visit
Admission: RE | Admit: 2019-07-24 | Discharge: 2019-07-24 | Disposition: A | Discharge status: Home / Self Care | Payer: Medicaid Other | Source: Ambulatory Visit | Attending: Radiation Oncology | Admitting: Radiation Oncology

## 2019-07-24 ENCOUNTER — Other Ambulatory Visit: Payer: Self-pay

## 2019-07-24 ENCOUNTER — Inpatient Hospital Stay: Payer: Medicaid Other

## 2019-07-24 DIAGNOSIS — C7801 Secondary malignant neoplasm of right lung: Secondary | ICD-10-CM | POA: Diagnosis not present

## 2019-07-24 DIAGNOSIS — I959 Hypotension, unspecified: Secondary | ICD-10-CM | POA: Diagnosis not present

## 2019-07-24 DIAGNOSIS — C7951 Secondary malignant neoplasm of bone: Secondary | ICD-10-CM | POA: Diagnosis not present

## 2019-07-24 DIAGNOSIS — D649 Anemia, unspecified: Secondary | ICD-10-CM | POA: Diagnosis not present

## 2019-07-24 DIAGNOSIS — I10 Essential (primary) hypertension: Secondary | ICD-10-CM

## 2019-07-24 DIAGNOSIS — G893 Neoplasm related pain (acute) (chronic): Secondary | ICD-10-CM | POA: Diagnosis not present

## 2019-07-24 DIAGNOSIS — J90 Pleural effusion, not elsewhere classified: Secondary | ICD-10-CM | POA: Diagnosis not present

## 2019-07-24 DIAGNOSIS — C50919 Malignant neoplasm of unspecified site of unspecified female breast: Secondary | ICD-10-CM | POA: Diagnosis not present

## 2019-07-24 DIAGNOSIS — C50811 Malignant neoplasm of overlapping sites of right female breast: Secondary | ICD-10-CM

## 2019-07-24 DIAGNOSIS — Z510 Encounter for antineoplastic radiation therapy: Secondary | ICD-10-CM | POA: Diagnosis not present

## 2019-07-24 DIAGNOSIS — Z170 Estrogen receptor positive status [ER+]: Secondary | ICD-10-CM

## 2019-07-24 DIAGNOSIS — R42 Dizziness and giddiness: Secondary | ICD-10-CM

## 2019-07-24 DIAGNOSIS — C50911 Malignant neoplasm of unspecified site of right female breast: Secondary | ICD-10-CM | POA: Diagnosis not present

## 2019-07-24 DIAGNOSIS — R0602 Shortness of breath: Secondary | ICD-10-CM | POA: Diagnosis not present

## 2019-07-24 LAB — CMP (CANCER CENTER ONLY)
ALT: 8 U/L (ref 0–44)
AST: 14 U/L — ABNORMAL LOW (ref 15–41)
Albumin: 3.2 g/dL — ABNORMAL LOW (ref 3.5–5.0)
Alkaline Phosphatase: 125 U/L (ref 38–126)
Anion gap: 9 (ref 5–15)
BUN: 17 mg/dL (ref 6–20)
CO2: 32 mmol/L (ref 22–32)
Calcium: 8.8 mg/dL — ABNORMAL LOW (ref 8.9–10.3)
Chloride: 103 mmol/L (ref 98–111)
Creatinine: 0.78 mg/dL (ref 0.44–1.00)
GFR, Est AFR Am: >60 mL/min (ref >60)
GFR, Estimated: >60 mL/min (ref >60)
Glucose, Bld: 87 mg/dL (ref 70–99)
Potassium: 3.7 mmol/L (ref 3.5–5.1)
Sodium: 144 mmol/L (ref 135–145)
Total Bilirubin: 0.3 mg/dL (ref 0.3–1.2)
Total Protein: 7 g/dL (ref 6.5–8.1)

## 2019-07-24 MED ORDER — BLOOD PRESSURE MONITOR MISC: 0 refills | Status: DC

## 2019-07-24 NOTE — Telephone Encounter (Signed)
[place] CSW Progress Notes  Call from daughter, [name].  Concerned that [employer] is stating they have an unpaid bill for oxygen from the time period that patient was considered "charity care" after discharge from inpatient floors.  CSW contact [place] who contacted [employer] rep.  Asked [employer] rep to investigate and determine where unpaid amount comes from = patient was either charity care or Medicaid funded for the time she had oxygen per daughter.  VM left for daughter advising that [employer] hospital rep was investigating and attempting to resolve issue.  Will communicate results to daughter/patient as soon as issue is resolved.  [name], LCSW Clinical Social Worker Phone:  [phone] Cell:  [phone]

## 2019-07-24 NOTE — Progress Notes (Signed)
Patient ID: Suzanne [name], female   DOB: [birth year], 59 y.o.   MRN: [id number]

## 2019-07-24 NOTE — Telephone Encounter (Signed)
Called pt Made aware of MD Note. Stated she will address additional questions for her provider during her scheduled telephone visit.Telephone appt today With MD Fulp. Instructed pt if needs additional info or any additional questions or concerns arise in regards to her meds to clarify with PCP on today's visit. Verbalized understanding

## 2019-07-24 NOTE — Progress Notes (Signed)
Patient has been called and DOB has been verified. Patient has been screened and transferred to PCP to start phone visit.   Home health nurse came out and BP has been low, needs to discuss medication adjustments.

## 2019-07-24 NOTE — Progress Notes (Signed)
Virtual Visit via Telephone Note  I connected with Suzanne [name] on 07/24/19 at 11:10 AM EST by telephone and verified that I am speaking with the correct person using two identifiers.   I discussed the limitations, risks, security and privacy concerns of performing an evaluation and management service by telephone and the availability of in person appointments. I also discussed with the patient that there may be a patient responsible charge related to this service. The patient expressed understanding and agreed to proceed.  Patient Location: Home Provider Location: [facility] Office Others participating in call: [name], patient's daughter   History of Present Illness:         59 year old female who is currently undergoing treatment for metastatic stage IV breast cancer and home health nurses had contacted the office regarding patient having low blood pressure.  Patient and daughter report that patient's blood pressure has been low and patient has had issues with dizziness when she makes changes in positions and especially with change from sitting to standing.  She is not having any dizziness when she is lying in her hospital bed.  Daughter states that they did not receive a call from this office until earlier this morning regarding the need for patient to hold her carvedilol.  Patient is also on Lasix but daughter reports that she was recently advised to change dosing of Lasix from 20 mg once per day to half a pill, 10 mg twice daily.  Patient continues to have fatigue as well as some shortness of breath with exertion but she is having minimal to no pain.  She does have appointment later today with radiation oncology for radiation therapy to the left shoulder.  She has not had any recent issues with vomiting, diarrhea or decreased appetite.  She continues to be able to ambulate short distances with her walker however recently she has had increased dizziness when she is standing and attempting to walk.   Daughter wonders if patient is eligible for home blood pressure monitor through her insurance.  Past Medical History:  Diagnosis Date  . Cancer ([place])   . Family history of breast cancer   . Family history of colon cancer   . Family history of melanoma   . Family history of prostate cancer   . Gallstones   . Glaucoma   . Mastitis    reports history of recurrent mastitis  . Metastatic breast cancer ([place])   . Obesity     Past Surgical History:  Procedure Laterality Date  . GLAUCOMA SURGERY  late 1990's  . IR THORACENTESIS ASP PLEURAL SPACE W/IMG GUIDE  10/28/2018  . IR THORACENTESIS ASP PLEURAL SPACE W/IMG GUIDE  10/29/2018  . OPEN REDUCTION INTERNAL FIXATION (ORIF) DISTAL RADIAL FRACTURE Left 01/09/2019   Procedure: OPEN REDUCTION INTERNAL FIXATION (ORIF) HUMERAL FRACTURE;  Surgeon: Netta Cedars, MD;  Location: [facility];  Service: Orthopedics;  Laterality: Left;    Family History  Problem Relation Age of Onset  . Breast cancer Sister        in her 69's-70s  . Heart disease Brother        CABG  . Heart disease Brother        CABG  . Skin cancer Brother        melanoma  . Colon cancer Cousin   . Heart attack Mother   . Heart attack Father   . Prostate cancer Brother     Social History   Tobacco Use  . Smoking status: Never Smoker  .  Smokeless tobacco: Never Used  . Tobacco comment: second hand smoke exposure  Substance Use Topics  . Alcohol use: No  . Drug use: No     No Known Allergies     Observations/Objective: No vital signs or physical exam conducted as visit was done via telephone  Assessment and Plan: 1. Hypotension, unspecified hypotension type Home health and patient/daughter report that patient has had recent low blood pressure readings and dizziness. She has been asked to hold the use of her carvedilol at this time but can restart if blood pressure is at 140/90 or greater. If BP remains low then her lasix dose may need to be adjusted further. Daughter  reports that they were recently instructed to divide the dose of lasix by taking 10 mg twice per day.  - Basic Metabolic Panel; Future - Blood Pressure Monitor MISC; Use to check blood pressure twice per day and more often if not feeling well  Dispense: 1 each; Refill: 0  2. Dizziness She reports issues with dizziness which is usually related to changes in position.  She is not having issues with dizziness when lying down.  We will have patient check basic metabolic panel when she goes to her visit at the cancer center to make sure that she is not having any electrolyte abnormalities such as low sodium and low potassium which may be causing her dizziness and no issues with elevated BUN and creatinine suggestive of dehydration.  We will also try to order blood pressure monitor for the patient so that she can monitor her blood pressure at home when she is having episodes of dizziness to see if her dizziness is hypertension related. - Basic Metabolic Panel; Future - Blood Pressure Monitor MISC; Use to check blood pressure twice per day and more often if not feeling well  Dispense: 1 each; Refill: 0  3. Essential hypertension Will place an order for patient to receive a home blood pressure monitor so that patient/family will be able to monitor her blood pressure at home. She is currently having issues with low blood pressure and I have asked that she hold her carvedilol for now and her lasix dose may also need to be adjusted if her pressure remains low.  - Blood Pressure Monitor MISC; Use to check blood pressure twice per day and more often if not feeling well  Dispense: 1 each; Refill: 0  4. Stage IV breast cancer in female [name]) Patient is currently under treatment for stage IV metastatic breast cancer.  She denies current issues with dehydration, no recent vomiting or diarrhea which might be contributing to her issues with low blood pressure.  She does have appointment today for radiation therapy to her  left shoulder and has been asked to obtain blood work at the cancer center in follow-up of her dizziness. - Blood Pressure Monitor MISC; Use to check blood pressure twice per day and more often if not feeling well  Dispense: 1 each; Refill: 0  Follow Up Instructions:Return for dizziness/hypotension- next week if not feeling better otherwise 2-4 weeks.    I discussed the assessment and treatment plan with the patient. The patient was provided an opportunity to ask questions and all were answered. The patient agreed with the plan and demonstrated an understanding of the instructions.   The patient was advised to call back or seek an in-person evaluation if the symptoms worsen or if the condition fails to improve as anticipated.  I provided 17 minutes of non-face-to-face time during  this encounter.   Antony Blackbird, MD

## 2019-07-27 ENCOUNTER — Ambulatory Visit: Payer: Medicaid Other

## 2019-07-27 ENCOUNTER — Ambulatory Visit
Admission: RE | Admit: 2019-07-27 | Discharge: 2019-07-27 | Disposition: A | Discharge status: Home / Self Care | Payer: Medicaid Other | Source: Ambulatory Visit | Attending: Radiation Oncology | Admitting: Radiation Oncology

## 2019-07-27 ENCOUNTER — Encounter: Payer: Self-pay | Admitting: Family Medicine

## 2019-07-27 ENCOUNTER — Other Ambulatory Visit: Payer: Self-pay

## 2019-07-27 DIAGNOSIS — S22000A Wedge compression fracture of unspecified thoracic vertebra, initial encounter for closed fracture: Secondary | ICD-10-CM | POA: Diagnosis not present

## 2019-07-27 DIAGNOSIS — C7951 Secondary malignant neoplasm of bone: Secondary | ICD-10-CM | POA: Insufficient documentation

## 2019-07-27 DIAGNOSIS — R0602 Shortness of breath: Secondary | ICD-10-CM | POA: Diagnosis not present

## 2019-07-27 DIAGNOSIS — Z510 Encounter for antineoplastic radiation therapy: Secondary | ICD-10-CM | POA: Diagnosis not present

## 2019-07-27 DIAGNOSIS — C50911 Malignant neoplasm of unspecified site of right female breast: Secondary | ICD-10-CM | POA: Diagnosis not present

## 2019-07-27 DIAGNOSIS — G893 Neoplasm related pain (acute) (chronic): Secondary | ICD-10-CM | POA: Diagnosis not present

## 2019-07-27 DIAGNOSIS — J90 Pleural effusion, not elsewhere classified: Secondary | ICD-10-CM | POA: Diagnosis not present

## 2019-07-27 DIAGNOSIS — J9621 Acute and chronic respiratory failure with hypoxia: Secondary | ICD-10-CM | POA: Diagnosis not present

## 2019-07-27 DIAGNOSIS — C7801 Secondary malignant neoplasm of right lung: Secondary | ICD-10-CM | POA: Diagnosis not present

## 2019-07-27 DIAGNOSIS — C50811 Malignant neoplasm of overlapping sites of right female breast: Secondary | ICD-10-CM | POA: Diagnosis not present

## 2019-07-27 DIAGNOSIS — I10 Essential (primary) hypertension: Secondary | ICD-10-CM | POA: Diagnosis not present

## 2019-07-27 DIAGNOSIS — D649 Anemia, unspecified: Secondary | ICD-10-CM | POA: Diagnosis not present

## 2019-07-28 ENCOUNTER — Ambulatory Visit: Payer: Medicaid Other

## 2019-07-28 ENCOUNTER — Ambulatory Visit
Admission: RE | Admit: 2019-07-28 | Discharge: 2019-07-28 | Disposition: A | Discharge status: Home / Self Care | Payer: Medicaid Other | Source: Ambulatory Visit | Attending: Radiation Oncology | Admitting: Radiation Oncology

## 2019-07-28 DIAGNOSIS — J90 Pleural effusion, not elsewhere classified: Secondary | ICD-10-CM | POA: Diagnosis not present

## 2019-07-28 DIAGNOSIS — G893 Neoplasm related pain (acute) (chronic): Secondary | ICD-10-CM | POA: Diagnosis not present

## 2019-07-28 DIAGNOSIS — R0602 Shortness of breath: Secondary | ICD-10-CM | POA: Diagnosis not present

## 2019-07-28 DIAGNOSIS — C50911 Malignant neoplasm of unspecified site of right female breast: Secondary | ICD-10-CM | POA: Diagnosis not present

## 2019-07-28 DIAGNOSIS — Z510 Encounter for antineoplastic radiation therapy: Secondary | ICD-10-CM | POA: Diagnosis not present

## 2019-07-28 DIAGNOSIS — D649 Anemia, unspecified: Secondary | ICD-10-CM | POA: Diagnosis not present

## 2019-07-28 DIAGNOSIS — C7801 Secondary malignant neoplasm of right lung: Secondary | ICD-10-CM | POA: Diagnosis not present

## 2019-07-28 DIAGNOSIS — C50811 Malignant neoplasm of overlapping sites of right female breast: Secondary | ICD-10-CM | POA: Diagnosis not present

## 2019-07-28 DIAGNOSIS — C7951 Secondary malignant neoplasm of bone: Secondary | ICD-10-CM | POA: Diagnosis not present

## 2019-07-29 ENCOUNTER — Ambulatory Visit: Payer: Medicaid Other

## 2019-07-29 ENCOUNTER — Ambulatory Visit
Admission: RE | Admit: 2019-07-29 | Discharge: 2019-07-29 | Disposition: A | Discharge status: Home / Self Care | Payer: Medicaid Other | Source: Ambulatory Visit | Attending: Radiation Oncology | Admitting: Radiation Oncology

## 2019-07-29 ENCOUNTER — Other Ambulatory Visit: Payer: Self-pay

## 2019-07-29 ENCOUNTER — Telehealth (HOSPITAL_COMMUNITY): Payer: Self-pay | Admitting: General Practice

## 2019-07-29 DIAGNOSIS — C50911 Malignant neoplasm of unspecified site of right female breast: Secondary | ICD-10-CM | POA: Diagnosis not present

## 2019-07-29 DIAGNOSIS — E669 Obesity, unspecified: Secondary | ICD-10-CM | POA: Diagnosis not present

## 2019-07-29 DIAGNOSIS — M6281 Muscle weakness (generalized): Secondary | ICD-10-CM | POA: Diagnosis not present

## 2019-07-29 DIAGNOSIS — R490 Dysphonia: Secondary | ICD-10-CM | POA: Diagnosis not present

## 2019-07-29 DIAGNOSIS — C50811 Malignant neoplasm of overlapping sites of right female breast: Secondary | ICD-10-CM | POA: Diagnosis not present

## 2019-07-29 DIAGNOSIS — D630 Anemia in neoplastic disease: Secondary | ICD-10-CM | POA: Diagnosis not present

## 2019-07-29 DIAGNOSIS — D649 Anemia, unspecified: Secondary | ICD-10-CM | POA: Diagnosis not present

## 2019-07-29 DIAGNOSIS — M8458XD Pathological fracture in neoplastic disease, other specified site, subsequent encounter for fracture with routine healing: Secondary | ICD-10-CM | POA: Diagnosis not present

## 2019-07-29 DIAGNOSIS — Z170 Estrogen receptor positive status [ER+]: Secondary | ICD-10-CM | POA: Diagnosis not present

## 2019-07-29 DIAGNOSIS — M84522D Pathological fracture in neoplastic disease, left humerus, subsequent encounter for fracture with routine healing: Secondary | ICD-10-CM | POA: Diagnosis not present

## 2019-07-29 DIAGNOSIS — M47816 Spondylosis without myelopathy or radiculopathy, lumbar region: Secondary | ICD-10-CM | POA: Diagnosis not present

## 2019-07-29 DIAGNOSIS — K5900 Constipation, unspecified: Secondary | ICD-10-CM | POA: Diagnosis not present

## 2019-07-29 DIAGNOSIS — J90 Pleural effusion, not elsewhere classified: Secondary | ICD-10-CM | POA: Diagnosis not present

## 2019-07-29 DIAGNOSIS — Z510 Encounter for antineoplastic radiation therapy: Secondary | ICD-10-CM | POA: Diagnosis not present

## 2019-07-29 DIAGNOSIS — M810 Age-related osteoporosis without current pathological fracture: Secondary | ICD-10-CM | POA: Diagnosis not present

## 2019-07-29 DIAGNOSIS — C7801 Secondary malignant neoplasm of right lung: Secondary | ICD-10-CM | POA: Diagnosis not present

## 2019-07-29 DIAGNOSIS — C7951 Secondary malignant neoplasm of bone: Secondary | ICD-10-CM | POA: Diagnosis not present

## 2019-07-29 DIAGNOSIS — R0602 Shortness of breath: Secondary | ICD-10-CM | POA: Diagnosis not present

## 2019-07-29 DIAGNOSIS — Z9181 History of falling: Secondary | ICD-10-CM | POA: Diagnosis not present

## 2019-07-29 DIAGNOSIS — G893 Neoplasm related pain (acute) (chronic): Secondary | ICD-10-CM | POA: Diagnosis not present

## 2019-07-29 DIAGNOSIS — J9611 Chronic respiratory failure with hypoxia: Secondary | ICD-10-CM | POA: Diagnosis not present

## 2019-07-29 DIAGNOSIS — I10 Essential (primary) hypertension: Secondary | ICD-10-CM | POA: Diagnosis not present

## 2019-07-29 NOTE — Telephone Encounter (Signed)
No entry 

## 2019-07-29 NOTE — Telephone Encounter (Signed)
[place] CSW Progress Notes  Multiple calls from daughter [name] w concerns related to receiving bills from [employer] for two hospital bills and for items she was under impression that were covered under charity care.  [employer] did not come on Monday 3/1 as they promised.  Per [employer], were unable to come because "they did not have the right size tanks."  Now [employer] has been scheduled to come today, March 3rd.  Daughter has not heard from [place] re delivery confirmation.  Bills for hospital bed have been resolved as [employer] is still waiting for Medicaid to pay on the bed.  Issue w prescription for oxygen has been resolved - now the issue is that home fill system has not been delivered by [employer] as promised.  Per [employer] the issue is that the kind of tank needed is not in supply at present.  Daughter would like to have more reliable communication from [place].  Wants to be sure that she has the in home fill system in place prior to appt at [facility] in two weeks.  Encouraged daughter to continue to communicate with [employer] and resolve issues w in home fill system.    [name], LCSW Clinical Social Worker Phone:  [phone] Cell:  [phone]

## 2019-07-30 ENCOUNTER — Ambulatory Visit: Payer: Medicaid Other

## 2019-07-30 ENCOUNTER — Ambulatory Visit
Admission: RE | Admit: 2019-07-30 | Discharge: 2019-07-30 | Disposition: A | Discharge status: Home / Self Care | Payer: Medicaid Other | Source: Ambulatory Visit | Attending: Radiation Oncology | Admitting: Radiation Oncology

## 2019-07-30 ENCOUNTER — Telehealth: Payer: Self-pay | Admitting: *Deleted

## 2019-07-30 ENCOUNTER — Telehealth: Payer: Self-pay | Admitting: General Practice

## 2019-07-30 ENCOUNTER — Other Ambulatory Visit: Payer: Self-pay

## 2019-07-30 DIAGNOSIS — D649 Anemia, unspecified: Secondary | ICD-10-CM | POA: Diagnosis not present

## 2019-07-30 DIAGNOSIS — I10 Essential (primary) hypertension: Secondary | ICD-10-CM | POA: Diagnosis not present

## 2019-07-30 DIAGNOSIS — R490 Dysphonia: Secondary | ICD-10-CM | POA: Diagnosis not present

## 2019-07-30 DIAGNOSIS — C50911 Malignant neoplasm of unspecified site of right female breast: Secondary | ICD-10-CM | POA: Diagnosis not present

## 2019-07-30 DIAGNOSIS — Z170 Estrogen receptor positive status [ER+]: Secondary | ICD-10-CM | POA: Diagnosis not present

## 2019-07-30 DIAGNOSIS — C7801 Secondary malignant neoplasm of right lung: Secondary | ICD-10-CM | POA: Diagnosis not present

## 2019-07-30 DIAGNOSIS — J90 Pleural effusion, not elsewhere classified: Secondary | ICD-10-CM | POA: Diagnosis not present

## 2019-07-30 DIAGNOSIS — Z510 Encounter for antineoplastic radiation therapy: Secondary | ICD-10-CM | POA: Diagnosis not present

## 2019-07-30 DIAGNOSIS — M8458XD Pathological fracture in neoplastic disease, other specified site, subsequent encounter for fracture with routine healing: Secondary | ICD-10-CM | POA: Diagnosis not present

## 2019-07-30 DIAGNOSIS — C50811 Malignant neoplasm of overlapping sites of right female breast: Secondary | ICD-10-CM | POA: Diagnosis not present

## 2019-07-30 DIAGNOSIS — D630 Anemia in neoplastic disease: Secondary | ICD-10-CM | POA: Diagnosis not present

## 2019-07-30 DIAGNOSIS — R0602 Shortness of breath: Secondary | ICD-10-CM | POA: Diagnosis not present

## 2019-07-30 DIAGNOSIS — K5900 Constipation, unspecified: Secondary | ICD-10-CM | POA: Diagnosis not present

## 2019-07-30 DIAGNOSIS — Z9181 History of falling: Secondary | ICD-10-CM | POA: Diagnosis not present

## 2019-07-30 DIAGNOSIS — M84522D Pathological fracture in neoplastic disease, left humerus, subsequent encounter for fracture with routine healing: Secondary | ICD-10-CM | POA: Diagnosis not present

## 2019-07-30 DIAGNOSIS — J9611 Chronic respiratory failure with hypoxia: Secondary | ICD-10-CM | POA: Diagnosis not present

## 2019-07-30 DIAGNOSIS — M810 Age-related osteoporosis without current pathological fracture: Secondary | ICD-10-CM | POA: Diagnosis not present

## 2019-07-30 DIAGNOSIS — M6281 Muscle weakness (generalized): Secondary | ICD-10-CM | POA: Diagnosis not present

## 2019-07-30 DIAGNOSIS — C7951 Secondary malignant neoplasm of bone: Secondary | ICD-10-CM | POA: Diagnosis not present

## 2019-07-30 DIAGNOSIS — E669 Obesity, unspecified: Secondary | ICD-10-CM | POA: Diagnosis not present

## 2019-07-30 DIAGNOSIS — M47816 Spondylosis without myelopathy or radiculopathy, lumbar region: Secondary | ICD-10-CM | POA: Diagnosis not present

## 2019-07-30 DIAGNOSIS — G893 Neoplasm related pain (acute) (chronic): Secondary | ICD-10-CM | POA: Diagnosis not present

## 2019-07-30 NOTE — Telephone Encounter (Signed)
[place] CSW Progress Notes  Email from [place] "[employer] escalation supervisor left a detailed voicemail for the patient on her cell number. She also left her direct phone number in case she has any questions. Adjustments have been made and in future will bill to the patients Medicaid."  Called daughter - VM was left on patient's cell phone which family does not check.  They verified that they did receive message and will talk w escalation supervisor in order to have only daughter's cell and home phone listed in patients records.  [name] is also frustrated by lack of appropriate oxygen tanks, is working w [employer], clinical to help resolve this situation if possible.     [name], LCSW Clinical Social Worker Phone:  [phone]

## 2019-07-30 NOTE — Telephone Encounter (Signed)
This RN spoke with [name] per her call stating she is frustrated due to ongoing issues with trying to get oxygen tanks for home fill use for pt's ambulatory oxygen tank.  [name] states she has been told 3 times now that tanks would be delivered but then called after by another department stating the tanks are not available at this time.  The most recent situation was this am.  Presently [name] states they have enough oxygen for ambulatory tank to last thru Monday 3/8- with pt having additional MD appointments next week.  This RN called to [facility] with [place] per above.  He acknowledged that overall national shortage of O2 tanks is occurring presently ( due to Covid ) but ideally this should not result in 3 time delay.   He will contact his supervisor and follow up on how to best proceed so tanks can be delivered and call this RN with update.

## 2019-07-31 ENCOUNTER — Other Ambulatory Visit: Payer: Self-pay

## 2019-07-31 ENCOUNTER — Encounter: Payer: Self-pay | Admitting: Radiation Oncology

## 2019-07-31 ENCOUNTER — Other Ambulatory Visit: Payer: Self-pay | Admitting: Adult Health

## 2019-07-31 ENCOUNTER — Telehealth: Payer: Self-pay | Admitting: Family Medicine

## 2019-07-31 ENCOUNTER — Ambulatory Visit: Payer: Medicaid Other

## 2019-07-31 ENCOUNTER — Ambulatory Visit
Admission: RE | Admit: 2019-07-31 | Discharge: 2019-07-31 | Disposition: A | Discharge status: Home / Self Care | Payer: Medicaid Other | Source: Ambulatory Visit | Attending: Radiation Oncology | Admitting: Radiation Oncology

## 2019-07-31 DIAGNOSIS — M84522D Pathological fracture in neoplastic disease, left humerus, subsequent encounter for fracture with routine healing: Secondary | ICD-10-CM | POA: Diagnosis not present

## 2019-07-31 DIAGNOSIS — G893 Neoplasm related pain (acute) (chronic): Secondary | ICD-10-CM | POA: Diagnosis not present

## 2019-07-31 DIAGNOSIS — Z170 Estrogen receptor positive status [ER+]: Secondary | ICD-10-CM | POA: Diagnosis not present

## 2019-07-31 DIAGNOSIS — R490 Dysphonia: Secondary | ICD-10-CM | POA: Diagnosis not present

## 2019-07-31 DIAGNOSIS — I10 Essential (primary) hypertension: Secondary | ICD-10-CM | POA: Diagnosis not present

## 2019-07-31 DIAGNOSIS — Z9181 History of falling: Secondary | ICD-10-CM | POA: Diagnosis not present

## 2019-07-31 DIAGNOSIS — D630 Anemia in neoplastic disease: Secondary | ICD-10-CM | POA: Diagnosis not present

## 2019-07-31 DIAGNOSIS — C7801 Secondary malignant neoplasm of right lung: Secondary | ICD-10-CM | POA: Diagnosis not present

## 2019-07-31 DIAGNOSIS — J9611 Chronic respiratory failure with hypoxia: Secondary | ICD-10-CM | POA: Diagnosis not present

## 2019-07-31 DIAGNOSIS — C50811 Malignant neoplasm of overlapping sites of right female breast: Secondary | ICD-10-CM | POA: Diagnosis not present

## 2019-07-31 DIAGNOSIS — Z510 Encounter for antineoplastic radiation therapy: Secondary | ICD-10-CM | POA: Diagnosis not present

## 2019-07-31 DIAGNOSIS — M47816 Spondylosis without myelopathy or radiculopathy, lumbar region: Secondary | ICD-10-CM | POA: Diagnosis not present

## 2019-07-31 DIAGNOSIS — M810 Age-related osteoporosis without current pathological fracture: Secondary | ICD-10-CM | POA: Diagnosis not present

## 2019-07-31 DIAGNOSIS — J90 Pleural effusion, not elsewhere classified: Secondary | ICD-10-CM | POA: Diagnosis not present

## 2019-07-31 DIAGNOSIS — R0602 Shortness of breath: Secondary | ICD-10-CM | POA: Diagnosis not present

## 2019-07-31 DIAGNOSIS — C7951 Secondary malignant neoplasm of bone: Secondary | ICD-10-CM | POA: Diagnosis not present

## 2019-07-31 DIAGNOSIS — C50911 Malignant neoplasm of unspecified site of right female breast: Secondary | ICD-10-CM | POA: Diagnosis not present

## 2019-07-31 DIAGNOSIS — K5900 Constipation, unspecified: Secondary | ICD-10-CM | POA: Diagnosis not present

## 2019-07-31 DIAGNOSIS — D649 Anemia, unspecified: Secondary | ICD-10-CM | POA: Diagnosis not present

## 2019-07-31 DIAGNOSIS — E669 Obesity, unspecified: Secondary | ICD-10-CM | POA: Diagnosis not present

## 2019-07-31 DIAGNOSIS — M6281 Muscle weakness (generalized): Secondary | ICD-10-CM | POA: Diagnosis not present

## 2019-07-31 DIAGNOSIS — M8458XD Pathological fracture in neoplastic disease, other specified site, subsequent encounter for fracture with routine healing: Secondary | ICD-10-CM | POA: Diagnosis not present

## 2019-07-31 NOTE — Telephone Encounter (Signed)
pts daughter called stating that her mother has hives on her back.

## 2019-07-31 NOTE — Telephone Encounter (Signed)
Please call patient/daughter to find out more information.  Patient can be given Benadryl to help with hives/itching but if she is having severe hives that are covering multiple areas or if she is having any shortness of breath, sensation of throat, tongue being swollen or inability to breathe properly, she should go to the emergency department for further evaluation.  She should also contact her oncologist if she is having any severe rash.  She should also contact radiation oncology if the area of hives is corresponding to the area of recent radiation therapy

## 2019-08-03 ENCOUNTER — Other Ambulatory Visit: Payer: Self-pay | Admitting: Radiation Therapy

## 2019-08-03 ENCOUNTER — Telehealth: Payer: Self-pay | Admitting: Family Medicine

## 2019-08-03 ENCOUNTER — Telehealth: Payer: Self-pay | Admitting: *Deleted

## 2019-08-03 NOTE — Telephone Encounter (Signed)
Jane would you be able to assist with this  

## 2019-08-03 NOTE — Telephone Encounter (Signed)
Patient daughter called saying that patient PCP wanted patient to check her BP. Patient has an aide and in order for the aide to check the patient BP her PCP needs to sign a change of medical status form. Patients aide works with [facility]. Please f/u

## 2019-08-03 NOTE — Telephone Encounter (Signed)
Message received from [facility]- daughter/caregiver for pt, stating they have not received tanks and equipment for " home fill system " .  Khrystyne states she has received calls x 2 previously informing her items were being delivered only to be called later to be informed they did not have the supplies and she would be on the next delivery.  Lat week this RN had placed call to [facility] per [employer] for assistance and per discussion- he would look into the above.  This RN placed another call today and put a message on his VM.  This RN will follow up per above upon return call.

## 2019-08-04 NOTE — Telephone Encounter (Signed)
If daughter is able to use the BP cuff after receiving instructions tomorrow, find out if she still would like an order for aide to check mother's BP as well? Also, if BP is back up to around 130/80 then the carvedilol can be restarted as long as BP remains above 110/60

## 2019-08-04 NOTE — Telephone Encounter (Signed)
Call returned to patient's daughter, [name].  She explained that they received the home BP monitor for her mother and they are not sure that they are using it correctly.  Informed her that this CM would contact [facility] and ask that the home health nurse review use with them .  Also asked that she bring it to the clinic with her mother for her appointment next week if the visit is in person.  [name] said that she called [employer] and was informed that the aide can take patient's BP with the monitor if the orders are updated.  Informed [name] that if Dr Chapman Fitch is in agreement, the orders for [facility] can be updated to request the aide check BP.  [name] said that her mother has not been complaining of any dizziness. Multiple therapists and nurses have been in and out of the home this past week and have checked her BP.  She said that Dr Chapman Fitch told her to hold the carvedilol unless the BP is greater than 140/90.  She said that her mother's BP has been less than 140/90 every time is has been checked except when the nurse checked it 07/31/2019 in the morning and she gave her mother the carvedilol 3.125 mg at that time.  She could not remember the exact BP reading.  BP last night 141/78. .  She continues to give her mother lasix 20 mg daily.    [name] also said that the oncologist is still working with [facility] to check the status of the O2 home fill system.   Call placed to [facility] # [phone], spoke to [facility] who said that she would inform patient's nurse, Olin Hauser, to review the use of the BP monitor with patient's daughter and assess the size of cuff to make sure that it is appropriate.  She said that Olin Hauser is scheduled to see the patient tomorrow.

## 2019-08-05 ENCOUNTER — Telehealth: Payer: Self-pay

## 2019-08-05 DIAGNOSIS — D649 Anemia, unspecified: Secondary | ICD-10-CM | POA: Diagnosis not present

## 2019-08-05 DIAGNOSIS — E669 Obesity, unspecified: Secondary | ICD-10-CM | POA: Diagnosis not present

## 2019-08-05 DIAGNOSIS — M47816 Spondylosis without myelopathy or radiculopathy, lumbar region: Secondary | ICD-10-CM | POA: Diagnosis not present

## 2019-08-05 DIAGNOSIS — Z170 Estrogen receptor positive status [ER+]: Secondary | ICD-10-CM | POA: Diagnosis not present

## 2019-08-05 DIAGNOSIS — M8458XD Pathological fracture in neoplastic disease, other specified site, subsequent encounter for fracture with routine healing: Secondary | ICD-10-CM | POA: Diagnosis not present

## 2019-08-05 DIAGNOSIS — R0602 Shortness of breath: Secondary | ICD-10-CM | POA: Diagnosis not present

## 2019-08-05 DIAGNOSIS — Z9181 History of falling: Secondary | ICD-10-CM | POA: Diagnosis not present

## 2019-08-05 DIAGNOSIS — M6281 Muscle weakness (generalized): Secondary | ICD-10-CM | POA: Diagnosis not present

## 2019-08-05 DIAGNOSIS — C7801 Secondary malignant neoplasm of right lung: Secondary | ICD-10-CM | POA: Diagnosis not present

## 2019-08-05 DIAGNOSIS — I10 Essential (primary) hypertension: Secondary | ICD-10-CM | POA: Diagnosis not present

## 2019-08-05 DIAGNOSIS — C7951 Secondary malignant neoplasm of bone: Secondary | ICD-10-CM | POA: Diagnosis not present

## 2019-08-05 DIAGNOSIS — R490 Dysphonia: Secondary | ICD-10-CM | POA: Diagnosis not present

## 2019-08-05 DIAGNOSIS — M84522D Pathological fracture in neoplastic disease, left humerus, subsequent encounter for fracture with routine healing: Secondary | ICD-10-CM | POA: Diagnosis not present

## 2019-08-05 DIAGNOSIS — M810 Age-related osteoporosis without current pathological fracture: Secondary | ICD-10-CM | POA: Diagnosis not present

## 2019-08-05 DIAGNOSIS — D630 Anemia in neoplastic disease: Secondary | ICD-10-CM | POA: Diagnosis not present

## 2019-08-05 DIAGNOSIS — G893 Neoplasm related pain (acute) (chronic): Secondary | ICD-10-CM | POA: Diagnosis not present

## 2019-08-05 DIAGNOSIS — C50811 Malignant neoplasm of overlapping sites of right female breast: Secondary | ICD-10-CM | POA: Diagnosis not present

## 2019-08-05 DIAGNOSIS — J90 Pleural effusion, not elsewhere classified: Secondary | ICD-10-CM | POA: Diagnosis not present

## 2019-08-05 DIAGNOSIS — K5900 Constipation, unspecified: Secondary | ICD-10-CM | POA: Diagnosis not present

## 2019-08-05 DIAGNOSIS — J9611 Chronic respiratory failure with hypoxia: Secondary | ICD-10-CM | POA: Diagnosis not present

## 2019-08-05 NOTE — Telephone Encounter (Signed)
PCS referral updated requesting the aide check patient's BP at each visit.   Faxed to [employer]

## 2019-08-05 NOTE — Telephone Encounter (Signed)
Yes

## 2019-08-05 NOTE — Telephone Encounter (Signed)
Call placed to the patient's daughter,[name], to review BP parameters with carvedilol.  This CM spoke to Dr Chapman Fitch again after receiving the latest message.  As per Dr Chapman Fitch, she should  hold her mother's carvedilol if BP less than 120/70. If she develops any dizziness and/or is concerned about the BP she should call this office.  [name] verbalized understanding.   [name] said that the home health nurse came today and reviewed with her how to use the BP machine and she is using it correctly.  She can check her mother's BP twice daily.  This morning when she checked it BP:130/73 and the home health nurse checked it and BP was 120/?  She could not remember diastolic value.  She also confirmed that her mother has an appointment with Dr Chapman Fitch 08/12/2019

## 2019-08-05 NOTE — Telephone Encounter (Signed)
Only if BP is lower than [id number] systolic or lower than 60 diastolic

## 2019-08-06 ENCOUNTER — Other Ambulatory Visit: Payer: Self-pay | Admitting: Oncology

## 2019-08-06 DIAGNOSIS — K5900 Constipation, unspecified: Secondary | ICD-10-CM | POA: Diagnosis not present

## 2019-08-06 DIAGNOSIS — Z170 Estrogen receptor positive status [ER+]: Secondary | ICD-10-CM | POA: Diagnosis not present

## 2019-08-06 DIAGNOSIS — M84522D Pathological fracture in neoplastic disease, left humerus, subsequent encounter for fracture with routine healing: Secondary | ICD-10-CM | POA: Diagnosis not present

## 2019-08-06 DIAGNOSIS — C50811 Malignant neoplasm of overlapping sites of right female breast: Secondary | ICD-10-CM | POA: Diagnosis not present

## 2019-08-06 DIAGNOSIS — J9611 Chronic respiratory failure with hypoxia: Secondary | ICD-10-CM | POA: Diagnosis not present

## 2019-08-06 DIAGNOSIS — D649 Anemia, unspecified: Secondary | ICD-10-CM | POA: Diagnosis not present

## 2019-08-06 DIAGNOSIS — Z9181 History of falling: Secondary | ICD-10-CM | POA: Diagnosis not present

## 2019-08-06 DIAGNOSIS — I10 Essential (primary) hypertension: Secondary | ICD-10-CM | POA: Diagnosis not present

## 2019-08-06 DIAGNOSIS — M6281 Muscle weakness (generalized): Secondary | ICD-10-CM | POA: Diagnosis not present

## 2019-08-06 DIAGNOSIS — G893 Neoplasm related pain (acute) (chronic): Secondary | ICD-10-CM | POA: Diagnosis not present

## 2019-08-06 DIAGNOSIS — R490 Dysphonia: Secondary | ICD-10-CM | POA: Diagnosis not present

## 2019-08-06 DIAGNOSIS — J90 Pleural effusion, not elsewhere classified: Secondary | ICD-10-CM | POA: Diagnosis not present

## 2019-08-06 DIAGNOSIS — M810 Age-related osteoporosis without current pathological fracture: Secondary | ICD-10-CM | POA: Diagnosis not present

## 2019-08-06 DIAGNOSIS — E669 Obesity, unspecified: Secondary | ICD-10-CM | POA: Diagnosis not present

## 2019-08-06 DIAGNOSIS — R0602 Shortness of breath: Secondary | ICD-10-CM | POA: Diagnosis not present

## 2019-08-06 DIAGNOSIS — D630 Anemia in neoplastic disease: Secondary | ICD-10-CM | POA: Diagnosis not present

## 2019-08-06 DIAGNOSIS — M47816 Spondylosis without myelopathy or radiculopathy, lumbar region: Secondary | ICD-10-CM | POA: Diagnosis not present

## 2019-08-06 DIAGNOSIS — C7951 Secondary malignant neoplasm of bone: Secondary | ICD-10-CM | POA: Diagnosis not present

## 2019-08-06 DIAGNOSIS — M8458XD Pathological fracture in neoplastic disease, other specified site, subsequent encounter for fracture with routine healing: Secondary | ICD-10-CM | POA: Diagnosis not present

## 2019-08-06 DIAGNOSIS — C7801 Secondary malignant neoplasm of right lung: Secondary | ICD-10-CM | POA: Diagnosis not present

## 2019-08-07 DIAGNOSIS — H9313 Tinnitus, bilateral: Secondary | ICD-10-CM | POA: Diagnosis not present

## 2019-08-07 DIAGNOSIS — H903 Sensorineural hearing loss, bilateral: Secondary | ICD-10-CM | POA: Insufficient documentation

## 2019-08-07 DIAGNOSIS — Z7722 Contact with and (suspected) exposure to environmental tobacco smoke (acute) (chronic): Secondary | ICD-10-CM | POA: Diagnosis not present

## 2019-08-07 DIAGNOSIS — J343 Hypertrophy of nasal turbinates: Secondary | ICD-10-CM | POA: Diagnosis not present

## 2019-08-07 DIAGNOSIS — H6123 Impacted cerumen, bilateral: Secondary | ICD-10-CM | POA: Diagnosis not present

## 2019-08-07 DIAGNOSIS — H9193 Unspecified hearing loss, bilateral: Secondary | ICD-10-CM | POA: Diagnosis not present

## 2019-08-07 DIAGNOSIS — R490 Dysphonia: Secondary | ICD-10-CM | POA: Diagnosis not present

## 2019-08-10 DIAGNOSIS — R0602 Shortness of breath: Secondary | ICD-10-CM | POA: Diagnosis not present

## 2019-08-10 DIAGNOSIS — C7951 Secondary malignant neoplasm of bone: Secondary | ICD-10-CM | POA: Diagnosis not present

## 2019-08-10 DIAGNOSIS — J9621 Acute and chronic respiratory failure with hypoxia: Secondary | ICD-10-CM | POA: Diagnosis not present

## 2019-08-10 DIAGNOSIS — J90 Pleural effusion, not elsewhere classified: Secondary | ICD-10-CM | POA: Diagnosis not present

## 2019-08-10 DIAGNOSIS — S22000A Wedge compression fracture of unspecified thoracic vertebra, initial encounter for closed fracture: Secondary | ICD-10-CM | POA: Diagnosis not present

## 2019-08-10 DIAGNOSIS — D649 Anemia, unspecified: Secondary | ICD-10-CM | POA: Diagnosis not present

## 2019-08-10 DIAGNOSIS — G893 Neoplasm related pain (acute) (chronic): Secondary | ICD-10-CM | POA: Diagnosis not present

## 2019-08-10 DIAGNOSIS — C7801 Secondary malignant neoplasm of right lung: Secondary | ICD-10-CM | POA: Diagnosis not present

## 2019-08-10 DIAGNOSIS — C50811 Malignant neoplasm of overlapping sites of right female breast: Secondary | ICD-10-CM | POA: Diagnosis not present

## 2019-08-11 ENCOUNTER — Inpatient Hospital Stay: Payer: Medicaid Other | Attending: Adult Health

## 2019-08-11 ENCOUNTER — Inpatient Hospital Stay: Payer: Medicaid Other

## 2019-08-11 ENCOUNTER — Other Ambulatory Visit: Payer: Self-pay

## 2019-08-11 VITALS — BP 142/93 | HR 83 | Temp 98.7°F | Resp 18

## 2019-08-11 DIAGNOSIS — C7951 Secondary malignant neoplasm of bone: Secondary | ICD-10-CM | POA: Diagnosis not present

## 2019-08-11 DIAGNOSIS — R0602 Shortness of breath: Secondary | ICD-10-CM

## 2019-08-11 DIAGNOSIS — G893 Neoplasm related pain (acute) (chronic): Secondary | ICD-10-CM

## 2019-08-11 DIAGNOSIS — C50811 Malignant neoplasm of overlapping sites of right female breast: Secondary | ICD-10-CM

## 2019-08-11 DIAGNOSIS — Z6841 Body Mass Index (BMI) 40.0 and over, adult: Secondary | ICD-10-CM

## 2019-08-11 DIAGNOSIS — C50911 Malignant neoplasm of unspecified site of right female breast: Secondary | ICD-10-CM | POA: Insufficient documentation

## 2019-08-11 DIAGNOSIS — J90 Pleural effusion, not elsewhere classified: Secondary | ICD-10-CM

## 2019-08-11 DIAGNOSIS — D649 Anemia, unspecified: Secondary | ICD-10-CM

## 2019-08-11 DIAGNOSIS — Z170 Estrogen receptor positive status [ER+]: Secondary | ICD-10-CM

## 2019-08-11 DIAGNOSIS — C7801 Secondary malignant neoplasm of right lung: Secondary | ICD-10-CM

## 2019-08-11 LAB — CBC WITH DIFFERENTIAL/PLATELET
Abs Immature Granulocytes: 0.01 10*3/uL (ref 0.00–0.07)
Basophils Absolute: 0.1 10*3/uL (ref 0.0–0.1)
Basophils Relative: 3 %
Eosinophils Absolute: 0.1 10*3/uL (ref 0.0–0.5)
Eosinophils Relative: 2 %
HCT: 29.6 % — ABNORMAL LOW (ref 36.0–46.0)
Hemoglobin: 9.5 g/dL — ABNORMAL LOW (ref 12.0–15.0)
Immature Granulocytes: 0 %
Lymphocytes Relative: 36 %
Lymphs Abs: 1.2 10*3/uL (ref 0.7–4.0)
MCH: 35.8 pg — ABNORMAL HIGH (ref 26.0–34.0)
MCHC: 32.1 g/dL (ref 30.0–36.0)
MCV: 111.7 fL — ABNORMAL HIGH (ref 80.0–100.0)
Monocytes Absolute: 0.2 10*3/uL (ref 0.1–1.0)
Monocytes Relative: 7 %
Neutro Abs: 1.7 10*3/uL (ref 1.7–7.7)
Neutrophils Relative %: 52 %
Platelets: 328 10*3/uL (ref 150–400)
RBC: 2.65 MIL/uL — ABNORMAL LOW (ref 3.87–5.11)
RDW: 15.2 % (ref 11.5–15.5)
WBC: 3.2 10*3/uL — ABNORMAL LOW (ref 4.0–10.5)
nRBC: 0 % (ref 0.0–0.2)

## 2019-08-11 LAB — COMPREHENSIVE METABOLIC PANEL
ALT: 11 U/L (ref 0–44)
AST: 17 U/L (ref 15–41)
Albumin: 3.5 g/dL (ref 3.5–5.0)
Alkaline Phosphatase: 127 U/L — ABNORMAL HIGH (ref 38–126)
Anion gap: 9 (ref 5–15)
BUN: 22 mg/dL — ABNORMAL HIGH (ref 6–20)
CO2: 30 mmol/L (ref 22–32)
Calcium: 9.6 mg/dL (ref 8.9–10.3)
Chloride: 101 mmol/L (ref 98–111)
Creatinine, Ser: 0.93 mg/dL (ref 0.44–1.00)
GFR calc Af Amer: >60 mL/min (ref >60)
GFR calc non Af Amer: >60 mL/min (ref >60)
Glucose, Bld: 97 mg/dL (ref 70–99)
Potassium: 4.5 mmol/L (ref 3.5–5.1)
Sodium: 140 mmol/L (ref 135–145)
Total Bilirubin: 0.3 mg/dL (ref 0.3–1.2)
Total Protein: 7.1 g/dL (ref 6.5–8.1)

## 2019-08-11 MED ORDER — DENOSUMAB 120 MG/1.7ML ~~LOC~~ SOLN
SUBCUTANEOUS | Status: AC
  Filled 2019-08-11: qty 1.7

## 2019-08-11 MED ORDER — DENOSUMAB 120 MG/1.7ML ~~LOC~~ SOLN
120.0000 mg | Freq: Once | SUBCUTANEOUS | Status: AC
  Administered 2019-08-11: 120 mg via SUBCUTANEOUS

## 2019-08-11 NOTE — Patient Instructions (Signed)
Denosumab injection What is this medicine? DENOSUMAB (den oh sue mab) slows bone breakdown. Prolia is used to treat osteoporosis in women after menopause and in men, and in people who are taking corticosteroids for 6 months or more. Xgeva is used to treat a high calcium level due to cancer and to prevent bone fractures and other bone problems caused by multiple myeloma or cancer bone metastases. Xgeva is also used to treat giant cell tumor of the bone. This medicine may be used for other purposes; ask your health care provider or pharmacist if you have questions. COMMON BRAND NAME(S): Prolia, XGEVA What should I tell my health care provider before I take this medicine? They need to know if you have any of these conditions:  dental disease  having surgery or tooth extraction  infection  kidney disease  low levels of calcium or Vitamin D in the blood  malnutrition  on hemodialysis  skin conditions or sensitivity  thyroid or parathyroid disease  an unusual reaction to denosumab, other medicines, foods, dyes, or preservatives  pregnant or trying to get pregnant  breast-feeding How should I use this medicine? This medicine is for injection under the skin. It is given by a health care professional in a hospital or clinic setting. A special MedGuide will be given to you before each treatment. Be sure to read this information carefully each time. For Prolia, talk to your pediatrician regarding the use of this medicine in children. Special care may be needed. For Xgeva, talk to your pediatrician regarding the use of this medicine in children. While this drug may be prescribed for children as young as 13 years for selected conditions, precautions do apply. Overdosage: If you think you have taken too much of this medicine contact a poison control center or emergency room at once. NOTE: This medicine is only for you. Do not share this medicine with others. What if I miss a dose? It is  important not to miss your dose. Call your doctor or health care professional if you are unable to keep an appointment. What may interact with this medicine? Do not take this medicine with any of the following medications:  other medicines containing denosumab This medicine may also interact with the following medications:  medicines that lower your chance of fighting infection  steroid medicines like prednisone or cortisone This list may not describe all possible interactions. Give your health care provider a list of all the medicines, herbs, non-prescription drugs, or dietary supplements you use. Also tell them if you smoke, drink alcohol, or use illegal drugs. Some items may interact with your medicine. What should I watch for while using this medicine? Visit your doctor or health care professional for regular checks on your progress. Your doctor or health care professional may order blood tests and other tests to see how you are doing. Call your doctor or health care professional for advice if you get a fever, chills or sore throat, or other symptoms of a cold or flu. Do not treat yourself. This drug may decrease your body's ability to fight infection. Try to avoid being around people who are sick. You should make sure you get enough calcium and vitamin D while you are taking this medicine, unless your doctor tells you not to. Discuss the foods you eat and the vitamins you take with your health care professional. See your dentist regularly. Brush and floss your teeth as directed. Before you have any dental work done, tell your dentist you are   receiving this medicine. Do not become pregnant while taking this medicine or for 5 months after stopping it. Talk with your doctor or health care professional about your birth control options while taking this medicine. Women should inform their doctor if they wish to become pregnant or think they might be pregnant. There is a potential for serious side  effects to an unborn child. Talk to your health care professional or pharmacist for more information. What side effects may I notice from receiving this medicine? Side effects that you should report to your doctor or health care professional as soon as possible:  allergic reactions like skin rash, itching or hives, swelling of the face, lips, or tongue  bone pain  breathing problems  dizziness  jaw pain, especially after dental work  redness, blistering, peeling of the skin  signs and symptoms of infection like fever or chills; cough; sore throat; pain or trouble passing urine  signs of low calcium like fast heartbeat, muscle cramps or muscle pain; pain, tingling, numbness in the hands or feet; seizures  unusual bleeding or bruising  unusually weak or tired Side effects that usually do not require medical attention (report to your doctor or health care professional if they continue or are bothersome):  constipation  diarrhea  headache  joint pain  loss of appetite  muscle pain  runny nose  tiredness  upset stomach This list may not describe all possible side effects. Call your doctor for medical advice about side effects. You may report side effects to FDA at 1-800-FDA-1088. Where should I keep my medicine? This medicine is only given in a clinic, doctor's office, or other health care setting and will not be stored at home. NOTE: This sheet is a summary. It may not cover all possible information. If you have questions about this medicine, talk to your doctor, pharmacist, or health care provider.  2020 Elsevier/Gold Standard (2017-09-20 16:10:44)

## 2019-08-12 ENCOUNTER — Ambulatory Visit: Payer: Medicaid Other | Admitting: Family Medicine

## 2019-08-12 DIAGNOSIS — Z170 Estrogen receptor positive status [ER+]: Secondary | ICD-10-CM | POA: Diagnosis not present

## 2019-08-12 DIAGNOSIS — M810 Age-related osteoporosis without current pathological fracture: Secondary | ICD-10-CM | POA: Diagnosis not present

## 2019-08-12 DIAGNOSIS — Z9181 History of falling: Secondary | ICD-10-CM | POA: Diagnosis not present

## 2019-08-12 DIAGNOSIS — R0602 Shortness of breath: Secondary | ICD-10-CM | POA: Diagnosis not present

## 2019-08-12 DIAGNOSIS — M47816 Spondylosis without myelopathy or radiculopathy, lumbar region: Secondary | ICD-10-CM | POA: Diagnosis not present

## 2019-08-12 DIAGNOSIS — C50811 Malignant neoplasm of overlapping sites of right female breast: Secondary | ICD-10-CM | POA: Diagnosis not present

## 2019-08-12 DIAGNOSIS — D649 Anemia, unspecified: Secondary | ICD-10-CM | POA: Diagnosis not present

## 2019-08-12 DIAGNOSIS — G893 Neoplasm related pain (acute) (chronic): Secondary | ICD-10-CM | POA: Diagnosis not present

## 2019-08-12 DIAGNOSIS — J90 Pleural effusion, not elsewhere classified: Secondary | ICD-10-CM | POA: Diagnosis not present

## 2019-08-12 DIAGNOSIS — K5900 Constipation, unspecified: Secondary | ICD-10-CM | POA: Diagnosis not present

## 2019-08-12 DIAGNOSIS — M84522D Pathological fracture in neoplastic disease, left humerus, subsequent encounter for fracture with routine healing: Secondary | ICD-10-CM | POA: Diagnosis not present

## 2019-08-12 DIAGNOSIS — I10 Essential (primary) hypertension: Secondary | ICD-10-CM | POA: Diagnosis not present

## 2019-08-12 DIAGNOSIS — D630 Anemia in neoplastic disease: Secondary | ICD-10-CM | POA: Diagnosis not present

## 2019-08-12 DIAGNOSIS — J9611 Chronic respiratory failure with hypoxia: Secondary | ICD-10-CM | POA: Diagnosis not present

## 2019-08-12 DIAGNOSIS — M8458XD Pathological fracture in neoplastic disease, other specified site, subsequent encounter for fracture with routine healing: Secondary | ICD-10-CM | POA: Diagnosis not present

## 2019-08-12 DIAGNOSIS — R490 Dysphonia: Secondary | ICD-10-CM | POA: Diagnosis not present

## 2019-08-12 DIAGNOSIS — C7951 Secondary malignant neoplasm of bone: Secondary | ICD-10-CM | POA: Diagnosis not present

## 2019-08-12 DIAGNOSIS — C7801 Secondary malignant neoplasm of right lung: Secondary | ICD-10-CM | POA: Diagnosis not present

## 2019-08-12 DIAGNOSIS — E669 Obesity, unspecified: Secondary | ICD-10-CM | POA: Diagnosis not present

## 2019-08-12 DIAGNOSIS — M6281 Muscle weakness (generalized): Secondary | ICD-10-CM | POA: Diagnosis not present

## 2019-08-12 LAB — CANCER ANTIGEN 27.29: CA 27.29: 43.8 U/mL — ABNORMAL HIGH (ref 0.0–38.6)

## 2019-08-13 DIAGNOSIS — M6281 Muscle weakness (generalized): Secondary | ICD-10-CM | POA: Diagnosis not present

## 2019-08-13 DIAGNOSIS — R0602 Shortness of breath: Secondary | ICD-10-CM | POA: Diagnosis not present

## 2019-08-13 DIAGNOSIS — Z170 Estrogen receptor positive status [ER+]: Secondary | ICD-10-CM | POA: Diagnosis not present

## 2019-08-13 DIAGNOSIS — M47816 Spondylosis without myelopathy or radiculopathy, lumbar region: Secondary | ICD-10-CM | POA: Diagnosis not present

## 2019-08-13 DIAGNOSIS — D630 Anemia in neoplastic disease: Secondary | ICD-10-CM | POA: Diagnosis not present

## 2019-08-13 DIAGNOSIS — C50811 Malignant neoplasm of overlapping sites of right female breast: Secondary | ICD-10-CM | POA: Diagnosis not present

## 2019-08-13 DIAGNOSIS — I10 Essential (primary) hypertension: Secondary | ICD-10-CM | POA: Diagnosis not present

## 2019-08-13 DIAGNOSIS — C7951 Secondary malignant neoplasm of bone: Secondary | ICD-10-CM | POA: Diagnosis not present

## 2019-08-13 DIAGNOSIS — M810 Age-related osteoporosis without current pathological fracture: Secondary | ICD-10-CM | POA: Diagnosis not present

## 2019-08-13 DIAGNOSIS — D649 Anemia, unspecified: Secondary | ICD-10-CM | POA: Diagnosis not present

## 2019-08-13 DIAGNOSIS — M84522D Pathological fracture in neoplastic disease, left humerus, subsequent encounter for fracture with routine healing: Secondary | ICD-10-CM | POA: Diagnosis not present

## 2019-08-13 DIAGNOSIS — K5900 Constipation, unspecified: Secondary | ICD-10-CM | POA: Diagnosis not present

## 2019-08-13 DIAGNOSIS — J90 Pleural effusion, not elsewhere classified: Secondary | ICD-10-CM | POA: Diagnosis not present

## 2019-08-13 DIAGNOSIS — M8458XD Pathological fracture in neoplastic disease, other specified site, subsequent encounter for fracture with routine healing: Secondary | ICD-10-CM | POA: Diagnosis not present

## 2019-08-13 DIAGNOSIS — E669 Obesity, unspecified: Secondary | ICD-10-CM | POA: Diagnosis not present

## 2019-08-13 DIAGNOSIS — J9611 Chronic respiratory failure with hypoxia: Secondary | ICD-10-CM | POA: Diagnosis not present

## 2019-08-13 DIAGNOSIS — R490 Dysphonia: Secondary | ICD-10-CM | POA: Diagnosis not present

## 2019-08-13 DIAGNOSIS — G893 Neoplasm related pain (acute) (chronic): Secondary | ICD-10-CM | POA: Diagnosis not present

## 2019-08-13 DIAGNOSIS — C7801 Secondary malignant neoplasm of right lung: Secondary | ICD-10-CM | POA: Diagnosis not present

## 2019-08-13 DIAGNOSIS — Z9181 History of falling: Secondary | ICD-10-CM | POA: Diagnosis not present

## 2019-08-14 DIAGNOSIS — J9611 Chronic respiratory failure with hypoxia: Secondary | ICD-10-CM | POA: Diagnosis not present

## 2019-08-14 DIAGNOSIS — E669 Obesity, unspecified: Secondary | ICD-10-CM | POA: Diagnosis not present

## 2019-08-14 DIAGNOSIS — R490 Dysphonia: Secondary | ICD-10-CM | POA: Diagnosis not present

## 2019-08-14 DIAGNOSIS — C50811 Malignant neoplasm of overlapping sites of right female breast: Secondary | ICD-10-CM | POA: Diagnosis not present

## 2019-08-14 DIAGNOSIS — Z9181 History of falling: Secondary | ICD-10-CM | POA: Diagnosis not present

## 2019-08-14 DIAGNOSIS — M47816 Spondylosis without myelopathy or radiculopathy, lumbar region: Secondary | ICD-10-CM | POA: Diagnosis not present

## 2019-08-14 DIAGNOSIS — I10 Essential (primary) hypertension: Secondary | ICD-10-CM | POA: Diagnosis not present

## 2019-08-14 DIAGNOSIS — R0602 Shortness of breath: Secondary | ICD-10-CM | POA: Diagnosis not present

## 2019-08-14 DIAGNOSIS — M6281 Muscle weakness (generalized): Secondary | ICD-10-CM | POA: Diagnosis not present

## 2019-08-14 DIAGNOSIS — M84522D Pathological fracture in neoplastic disease, left humerus, subsequent encounter for fracture with routine healing: Secondary | ICD-10-CM | POA: Diagnosis not present

## 2019-08-14 DIAGNOSIS — J90 Pleural effusion, not elsewhere classified: Secondary | ICD-10-CM | POA: Diagnosis not present

## 2019-08-14 DIAGNOSIS — G893 Neoplasm related pain (acute) (chronic): Secondary | ICD-10-CM | POA: Diagnosis not present

## 2019-08-14 DIAGNOSIS — C7801 Secondary malignant neoplasm of right lung: Secondary | ICD-10-CM | POA: Diagnosis not present

## 2019-08-14 DIAGNOSIS — D649 Anemia, unspecified: Secondary | ICD-10-CM | POA: Diagnosis not present

## 2019-08-14 DIAGNOSIS — K5900 Constipation, unspecified: Secondary | ICD-10-CM | POA: Diagnosis not present

## 2019-08-14 DIAGNOSIS — M8458XD Pathological fracture in neoplastic disease, other specified site, subsequent encounter for fracture with routine healing: Secondary | ICD-10-CM | POA: Diagnosis not present

## 2019-08-14 DIAGNOSIS — M810 Age-related osteoporosis without current pathological fracture: Secondary | ICD-10-CM | POA: Diagnosis not present

## 2019-08-14 DIAGNOSIS — C7951 Secondary malignant neoplasm of bone: Secondary | ICD-10-CM | POA: Diagnosis not present

## 2019-08-14 DIAGNOSIS — D630 Anemia in neoplastic disease: Secondary | ICD-10-CM | POA: Diagnosis not present

## 2019-08-14 DIAGNOSIS — Z170 Estrogen receptor positive status [ER+]: Secondary | ICD-10-CM | POA: Diagnosis not present

## 2019-08-17 DIAGNOSIS — C50811 Malignant neoplasm of overlapping sites of right female breast: Secondary | ICD-10-CM | POA: Diagnosis not present

## 2019-08-17 DIAGNOSIS — J90 Pleural effusion, not elsewhere classified: Secondary | ICD-10-CM | POA: Diagnosis not present

## 2019-08-17 DIAGNOSIS — C7951 Secondary malignant neoplasm of bone: Secondary | ICD-10-CM | POA: Diagnosis not present

## 2019-08-17 DIAGNOSIS — D649 Anemia, unspecified: Secondary | ICD-10-CM | POA: Diagnosis not present

## 2019-08-17 DIAGNOSIS — C7801 Secondary malignant neoplasm of right lung: Secondary | ICD-10-CM | POA: Diagnosis not present

## 2019-08-17 DIAGNOSIS — G893 Neoplasm related pain (acute) (chronic): Secondary | ICD-10-CM | POA: Diagnosis not present

## 2019-08-17 DIAGNOSIS — R0602 Shortness of breath: Secondary | ICD-10-CM | POA: Diagnosis not present

## 2019-08-18 ENCOUNTER — Telehealth: Payer: Self-pay | Admitting: *Deleted

## 2019-08-18 DIAGNOSIS — M6281 Muscle weakness (generalized): Secondary | ICD-10-CM | POA: Diagnosis not present

## 2019-08-18 DIAGNOSIS — M84522D Pathological fracture in neoplastic disease, left humerus, subsequent encounter for fracture with routine healing: Secondary | ICD-10-CM | POA: Diagnosis not present

## 2019-08-18 DIAGNOSIS — M8458XD Pathological fracture in neoplastic disease, other specified site, subsequent encounter for fracture with routine healing: Secondary | ICD-10-CM | POA: Diagnosis not present

## 2019-08-18 DIAGNOSIS — R490 Dysphonia: Secondary | ICD-10-CM | POA: Diagnosis not present

## 2019-08-18 DIAGNOSIS — I10 Essential (primary) hypertension: Secondary | ICD-10-CM | POA: Diagnosis not present

## 2019-08-18 DIAGNOSIS — M810 Age-related osteoporosis without current pathological fracture: Secondary | ICD-10-CM | POA: Diagnosis not present

## 2019-08-18 DIAGNOSIS — D630 Anemia in neoplastic disease: Secondary | ICD-10-CM | POA: Diagnosis not present

## 2019-08-18 DIAGNOSIS — K5900 Constipation, unspecified: Secondary | ICD-10-CM | POA: Diagnosis not present

## 2019-08-18 DIAGNOSIS — G893 Neoplasm related pain (acute) (chronic): Secondary | ICD-10-CM | POA: Diagnosis not present

## 2019-08-18 DIAGNOSIS — E669 Obesity, unspecified: Secondary | ICD-10-CM | POA: Diagnosis not present

## 2019-08-18 DIAGNOSIS — Z170 Estrogen receptor positive status [ER+]: Secondary | ICD-10-CM | POA: Diagnosis not present

## 2019-08-18 DIAGNOSIS — C7801 Secondary malignant neoplasm of right lung: Secondary | ICD-10-CM | POA: Diagnosis not present

## 2019-08-18 DIAGNOSIS — C7951 Secondary malignant neoplasm of bone: Secondary | ICD-10-CM | POA: Diagnosis not present

## 2019-08-18 DIAGNOSIS — M47816 Spondylosis without myelopathy or radiculopathy, lumbar region: Secondary | ICD-10-CM | POA: Diagnosis not present

## 2019-08-18 DIAGNOSIS — C50811 Malignant neoplasm of overlapping sites of right female breast: Secondary | ICD-10-CM | POA: Diagnosis not present

## 2019-08-18 DIAGNOSIS — J9611 Chronic respiratory failure with hypoxia: Secondary | ICD-10-CM | POA: Diagnosis not present

## 2019-08-18 DIAGNOSIS — Z9181 History of falling: Secondary | ICD-10-CM | POA: Diagnosis not present

## 2019-08-18 NOTE — Telephone Encounter (Signed)
Received VM from pt daughter.  Attempt x 1 to return call, no answer.  LVM to return call to the office.

## 2019-08-19 DIAGNOSIS — C7951 Secondary malignant neoplasm of bone: Secondary | ICD-10-CM | POA: Diagnosis not present

## 2019-08-19 DIAGNOSIS — J90 Pleural effusion, not elsewhere classified: Secondary | ICD-10-CM | POA: Diagnosis not present

## 2019-08-19 DIAGNOSIS — G893 Neoplasm related pain (acute) (chronic): Secondary | ICD-10-CM | POA: Diagnosis not present

## 2019-08-19 DIAGNOSIS — D649 Anemia, unspecified: Secondary | ICD-10-CM | POA: Diagnosis not present

## 2019-08-19 DIAGNOSIS — C7801 Secondary malignant neoplasm of right lung: Secondary | ICD-10-CM | POA: Diagnosis not present

## 2019-08-19 DIAGNOSIS — C50811 Malignant neoplasm of overlapping sites of right female breast: Secondary | ICD-10-CM | POA: Diagnosis not present

## 2019-08-19 DIAGNOSIS — R0602 Shortness of breath: Secondary | ICD-10-CM | POA: Diagnosis not present

## 2019-08-19 NOTE — Telephone Encounter (Signed)
This RN spoke with pt's daughterAnderson Malta - per concerns.  Uncle ( her mother's brother ) has been diagnosed with early bladder cancer- " says they have taken care if it and he doesn't need any further treatment - just check with scans "  At primary MD visit recently,Dr Fulp stating possible surgery intervention for noted type 3 hiatal hernia " and was going to talk with Dr Jana Hakim but then she had a family emergency and we were told she would be out for about a month- and we haven't heard anything further"  [name] is inquiring if Dr Jana Hakim has heard from Dr Chapman Fitch.  This RN informed not known that Dr Chapman Fitch contacted Dr Jana Hakim- will ask. Discussed overall hiatal hernia and unless it is causing swallowing or digestive issue- surgery discussion can wait until Dr Chapman Fitch has returned. Only known issue related to this is pt get frequent  Irritating hiccups.  It is time to reorder [name] and she wanted to very dose as 100mg  tables- this RN informed that is correct.  Noted mild elevation in [place] 27.29 this month- [name] reassured her mother likely not an issue or this office would have contacted her.  This RN verified above as correct- mild bumps are normal.  [name] states lastly that the area of rash and skin breakdown overall is responding well to the use of the antifungal cream except for one little area which has " like a clear gooey covering on it "  She states the home health nurse - which was a new nurse to them -looked at it but didn't stay what to do for it.  This RN advised [name] to clean area daily, mix some of the barrier cream they have in the home and a small amount of antifungal - dab on noted area and cover with a clean cotton cloth.  Only need that is outstanding is possible discussion regarding if surgery is indicated and safe for pt for hiatal hernia.  No other needs at this time.  This note will be forwarded to MD for review and any recommendations if needed.

## 2019-08-20 DIAGNOSIS — G893 Neoplasm related pain (acute) (chronic): Secondary | ICD-10-CM | POA: Diagnosis not present

## 2019-08-20 DIAGNOSIS — D649 Anemia, unspecified: Secondary | ICD-10-CM | POA: Diagnosis not present

## 2019-08-20 DIAGNOSIS — C7801 Secondary malignant neoplasm of right lung: Secondary | ICD-10-CM | POA: Diagnosis not present

## 2019-08-20 DIAGNOSIS — C50811 Malignant neoplasm of overlapping sites of right female breast: Secondary | ICD-10-CM | POA: Diagnosis not present

## 2019-08-20 DIAGNOSIS — J90 Pleural effusion, not elsewhere classified: Secondary | ICD-10-CM | POA: Diagnosis not present

## 2019-08-20 DIAGNOSIS — C7951 Secondary malignant neoplasm of bone: Secondary | ICD-10-CM | POA: Diagnosis not present

## 2019-08-20 DIAGNOSIS — R0602 Shortness of breath: Secondary | ICD-10-CM | POA: Diagnosis not present

## 2019-08-21 ENCOUNTER — Ambulatory Visit: Payer: Medicaid Other | Attending: Internal Medicine

## 2019-08-21 DIAGNOSIS — D649 Anemia, unspecified: Secondary | ICD-10-CM | POA: Diagnosis not present

## 2019-08-21 DIAGNOSIS — C50811 Malignant neoplasm of overlapping sites of right female breast: Secondary | ICD-10-CM | POA: Diagnosis not present

## 2019-08-21 DIAGNOSIS — R0602 Shortness of breath: Secondary | ICD-10-CM | POA: Diagnosis not present

## 2019-08-21 DIAGNOSIS — C7951 Secondary malignant neoplasm of bone: Secondary | ICD-10-CM | POA: Diagnosis not present

## 2019-08-21 DIAGNOSIS — Z23 Encounter for immunization: Secondary | ICD-10-CM

## 2019-08-21 DIAGNOSIS — G893 Neoplasm related pain (acute) (chronic): Secondary | ICD-10-CM | POA: Diagnosis not present

## 2019-08-21 DIAGNOSIS — C7801 Secondary malignant neoplasm of right lung: Secondary | ICD-10-CM | POA: Diagnosis not present

## 2019-08-21 DIAGNOSIS — J90 Pleural effusion, not elsewhere classified: Secondary | ICD-10-CM | POA: Diagnosis not present

## 2019-08-21 NOTE — Progress Notes (Signed)
   Covid-19 Vaccination Clinic  Name:  Suzanne [name]    MRN: [id number] DOB: 02-Sep-1960  08/21/2019  [name] was observed post Covid-19 immunization for 15 minutes without incident. She was provided with Vaccine Information Sheet and instruction to access the V-Safe system.   [name] was instructed to call 911 with any severe reactions post vaccine: Marland Kitchen Difficulty breathing  . Swelling of face and throat  . A fast heartbeat  . A bad rash all over body  . Dizziness and weakness   Immunizations Administered    Name Date Dose VIS Date Route   Pfizer COVID-19 Vaccine 08/21/2019 11:44 AM 0.3 mL 05/08/2019 Intramuscular   Manufacturer: [place]   Lot: [id number]   [place]: [id number]

## 2019-08-25 ENCOUNTER — Telehealth: Payer: Self-pay

## 2019-08-25 DIAGNOSIS — J90 Pleural effusion, not elsewhere classified: Secondary | ICD-10-CM | POA: Diagnosis not present

## 2019-08-25 DIAGNOSIS — R0602 Shortness of breath: Secondary | ICD-10-CM | POA: Diagnosis not present

## 2019-08-25 DIAGNOSIS — C7951 Secondary malignant neoplasm of bone: Secondary | ICD-10-CM | POA: Diagnosis not present

## 2019-08-25 DIAGNOSIS — G893 Neoplasm related pain (acute) (chronic): Secondary | ICD-10-CM | POA: Diagnosis not present

## 2019-08-25 DIAGNOSIS — C50811 Malignant neoplasm of overlapping sites of right female breast: Secondary | ICD-10-CM | POA: Diagnosis not present

## 2019-08-25 DIAGNOSIS — D649 Anemia, unspecified: Secondary | ICD-10-CM | POA: Diagnosis not present

## 2019-08-25 DIAGNOSIS — C7801 Secondary malignant neoplasm of right lung: Secondary | ICD-10-CM | POA: Diagnosis not present

## 2019-08-25 NOTE — Telephone Encounter (Signed)
Call placed to [facility], spoke to [place] who said that patient's assessment was done on 08/12/2019.   Request for re-assessment had been faxed 08/05/2019

## 2019-08-26 DIAGNOSIS — I10 Essential (primary) hypertension: Secondary | ICD-10-CM | POA: Diagnosis not present

## 2019-08-26 DIAGNOSIS — D630 Anemia in neoplastic disease: Secondary | ICD-10-CM | POA: Diagnosis not present

## 2019-08-26 DIAGNOSIS — Z170 Estrogen receptor positive status [ER+]: Secondary | ICD-10-CM | POA: Diagnosis not present

## 2019-08-26 DIAGNOSIS — M47816 Spondylosis without myelopathy or radiculopathy, lumbar region: Secondary | ICD-10-CM | POA: Diagnosis not present

## 2019-08-26 DIAGNOSIS — H25812 Combined forms of age-related cataract, left eye: Secondary | ICD-10-CM | POA: Diagnosis not present

## 2019-08-26 DIAGNOSIS — K5900 Constipation, unspecified: Secondary | ICD-10-CM | POA: Diagnosis not present

## 2019-08-26 DIAGNOSIS — C7801 Secondary malignant neoplasm of right lung: Secondary | ICD-10-CM | POA: Diagnosis not present

## 2019-08-26 DIAGNOSIS — M810 Age-related osteoporosis without current pathological fracture: Secondary | ICD-10-CM | POA: Diagnosis not present

## 2019-08-26 DIAGNOSIS — M84522D Pathological fracture in neoplastic disease, left humerus, subsequent encounter for fracture with routine healing: Secondary | ICD-10-CM | POA: Diagnosis not present

## 2019-08-26 DIAGNOSIS — H40013 Open angle with borderline findings, low risk, bilateral: Secondary | ICD-10-CM | POA: Diagnosis not present

## 2019-08-26 DIAGNOSIS — M6281 Muscle weakness (generalized): Secondary | ICD-10-CM | POA: Diagnosis not present

## 2019-08-26 DIAGNOSIS — R490 Dysphonia: Secondary | ICD-10-CM | POA: Diagnosis not present

## 2019-08-26 DIAGNOSIS — D649 Anemia, unspecified: Secondary | ICD-10-CM | POA: Diagnosis not present

## 2019-08-26 DIAGNOSIS — G893 Neoplasm related pain (acute) (chronic): Secondary | ICD-10-CM | POA: Diagnosis not present

## 2019-08-26 DIAGNOSIS — H2511 Age-related nuclear cataract, right eye: Secondary | ICD-10-CM | POA: Diagnosis not present

## 2019-08-26 DIAGNOSIS — J90 Pleural effusion, not elsewhere classified: Secondary | ICD-10-CM | POA: Diagnosis not present

## 2019-08-26 DIAGNOSIS — E669 Obesity, unspecified: Secondary | ICD-10-CM | POA: Diagnosis not present

## 2019-08-26 DIAGNOSIS — R0602 Shortness of breath: Secondary | ICD-10-CM | POA: Diagnosis not present

## 2019-08-26 DIAGNOSIS — M8458XD Pathological fracture in neoplastic disease, other specified site, subsequent encounter for fracture with routine healing: Secondary | ICD-10-CM | POA: Diagnosis not present

## 2019-08-26 DIAGNOSIS — C7951 Secondary malignant neoplasm of bone: Secondary | ICD-10-CM | POA: Diagnosis not present

## 2019-08-26 DIAGNOSIS — Z9181 History of falling: Secondary | ICD-10-CM | POA: Diagnosis not present

## 2019-08-26 DIAGNOSIS — C50811 Malignant neoplasm of overlapping sites of right female breast: Secondary | ICD-10-CM | POA: Diagnosis not present

## 2019-08-26 DIAGNOSIS — J9611 Chronic respiratory failure with hypoxia: Secondary | ICD-10-CM | POA: Diagnosis not present

## 2019-08-27 ENCOUNTER — Telehealth: Payer: Self-pay | Admitting: *Deleted

## 2019-08-27 DIAGNOSIS — S22000A Wedge compression fracture of unspecified thoracic vertebra, initial encounter for closed fracture: Secondary | ICD-10-CM | POA: Diagnosis not present

## 2019-08-27 DIAGNOSIS — M47816 Spondylosis without myelopathy or radiculopathy, lumbar region: Secondary | ICD-10-CM | POA: Diagnosis not present

## 2019-08-27 DIAGNOSIS — C50811 Malignant neoplasm of overlapping sites of right female breast: Secondary | ICD-10-CM | POA: Diagnosis not present

## 2019-08-27 DIAGNOSIS — D630 Anemia in neoplastic disease: Secondary | ICD-10-CM | POA: Diagnosis not present

## 2019-08-27 DIAGNOSIS — E669 Obesity, unspecified: Secondary | ICD-10-CM | POA: Diagnosis not present

## 2019-08-27 DIAGNOSIS — M8458XD Pathological fracture in neoplastic disease, other specified site, subsequent encounter for fracture with routine healing: Secondary | ICD-10-CM | POA: Diagnosis not present

## 2019-08-27 DIAGNOSIS — M6281 Muscle weakness (generalized): Secondary | ICD-10-CM | POA: Diagnosis not present

## 2019-08-27 DIAGNOSIS — Z7722 Contact with and (suspected) exposure to environmental tobacco smoke (acute) (chronic): Secondary | ICD-10-CM | POA: Diagnosis not present

## 2019-08-27 DIAGNOSIS — R0602 Shortness of breath: Secondary | ICD-10-CM | POA: Diagnosis not present

## 2019-08-27 DIAGNOSIS — J9611 Chronic respiratory failure with hypoxia: Secondary | ICD-10-CM | POA: Diagnosis not present

## 2019-08-27 DIAGNOSIS — C7801 Secondary malignant neoplasm of right lung: Secondary | ICD-10-CM | POA: Diagnosis not present

## 2019-08-27 DIAGNOSIS — Z170 Estrogen receptor positive status [ER+]: Secondary | ICD-10-CM | POA: Diagnosis not present

## 2019-08-27 DIAGNOSIS — G893 Neoplasm related pain (acute) (chronic): Secondary | ICD-10-CM | POA: Diagnosis not present

## 2019-08-27 DIAGNOSIS — M810 Age-related osteoporosis without current pathological fracture: Secondary | ICD-10-CM | POA: Diagnosis not present

## 2019-08-27 DIAGNOSIS — M84522D Pathological fracture in neoplastic disease, left humerus, subsequent encounter for fracture with routine healing: Secondary | ICD-10-CM | POA: Diagnosis not present

## 2019-08-27 DIAGNOSIS — J9621 Acute and chronic respiratory failure with hypoxia: Secondary | ICD-10-CM | POA: Diagnosis not present

## 2019-08-27 DIAGNOSIS — C7951 Secondary malignant neoplasm of bone: Secondary | ICD-10-CM | POA: Diagnosis not present

## 2019-08-27 DIAGNOSIS — K5900 Constipation, unspecified: Secondary | ICD-10-CM | POA: Diagnosis not present

## 2019-08-27 DIAGNOSIS — R490 Dysphonia: Secondary | ICD-10-CM | POA: Diagnosis not present

## 2019-08-27 DIAGNOSIS — Z9181 History of falling: Secondary | ICD-10-CM | POA: Diagnosis not present

## 2019-08-27 DIAGNOSIS — I10 Essential (primary) hypertension: Secondary | ICD-10-CM | POA: Diagnosis not present

## 2019-08-27 DIAGNOSIS — H903 Sensorineural hearing loss, bilateral: Secondary | ICD-10-CM | POA: Diagnosis not present

## 2019-08-27 NOTE — Telephone Encounter (Signed)
Received call from pt's daughter stating that pt went to eye MD & she has cataracts & surg is planned for end of April.  She just wanted to make sure this didn't conflict with anything with Dr Jana Hakim.  Message to Dr Jana Hakim.

## 2019-08-28 DIAGNOSIS — Z9181 History of falling: Secondary | ICD-10-CM | POA: Diagnosis not present

## 2019-08-28 DIAGNOSIS — M8458XD Pathological fracture in neoplastic disease, other specified site, subsequent encounter for fracture with routine healing: Secondary | ICD-10-CM | POA: Diagnosis not present

## 2019-08-28 DIAGNOSIS — K5900 Constipation, unspecified: Secondary | ICD-10-CM | POA: Diagnosis not present

## 2019-08-28 DIAGNOSIS — M810 Age-related osteoporosis without current pathological fracture: Secondary | ICD-10-CM | POA: Diagnosis not present

## 2019-08-28 DIAGNOSIS — J9611 Chronic respiratory failure with hypoxia: Secondary | ICD-10-CM | POA: Diagnosis not present

## 2019-08-28 DIAGNOSIS — C7951 Secondary malignant neoplasm of bone: Secondary | ICD-10-CM | POA: Diagnosis not present

## 2019-08-28 DIAGNOSIS — R0602 Shortness of breath: Secondary | ICD-10-CM | POA: Diagnosis not present

## 2019-08-28 DIAGNOSIS — J90 Pleural effusion, not elsewhere classified: Secondary | ICD-10-CM | POA: Diagnosis not present

## 2019-08-28 DIAGNOSIS — E669 Obesity, unspecified: Secondary | ICD-10-CM | POA: Diagnosis not present

## 2019-08-28 DIAGNOSIS — D649 Anemia, unspecified: Secondary | ICD-10-CM | POA: Diagnosis not present

## 2019-08-28 DIAGNOSIS — G893 Neoplasm related pain (acute) (chronic): Secondary | ICD-10-CM | POA: Diagnosis not present

## 2019-08-28 DIAGNOSIS — Z170 Estrogen receptor positive status [ER+]: Secondary | ICD-10-CM | POA: Diagnosis not present

## 2019-08-28 DIAGNOSIS — C7801 Secondary malignant neoplasm of right lung: Secondary | ICD-10-CM | POA: Diagnosis not present

## 2019-08-28 DIAGNOSIS — R490 Dysphonia: Secondary | ICD-10-CM | POA: Diagnosis not present

## 2019-08-28 DIAGNOSIS — M6281 Muscle weakness (generalized): Secondary | ICD-10-CM | POA: Diagnosis not present

## 2019-08-28 DIAGNOSIS — I10 Essential (primary) hypertension: Secondary | ICD-10-CM | POA: Diagnosis not present

## 2019-08-28 DIAGNOSIS — C50811 Malignant neoplasm of overlapping sites of right female breast: Secondary | ICD-10-CM | POA: Diagnosis not present

## 2019-08-28 DIAGNOSIS — D630 Anemia in neoplastic disease: Secondary | ICD-10-CM | POA: Diagnosis not present

## 2019-08-28 DIAGNOSIS — M47816 Spondylosis without myelopathy or radiculopathy, lumbar region: Secondary | ICD-10-CM | POA: Diagnosis not present

## 2019-08-28 DIAGNOSIS — M84522D Pathological fracture in neoplastic disease, left humerus, subsequent encounter for fracture with routine healing: Secondary | ICD-10-CM | POA: Diagnosis not present

## 2019-08-31 ENCOUNTER — Telehealth: Payer: Self-pay

## 2019-08-31 DIAGNOSIS — R0602 Shortness of breath: Secondary | ICD-10-CM | POA: Diagnosis not present

## 2019-08-31 DIAGNOSIS — C50811 Malignant neoplasm of overlapping sites of right female breast: Secondary | ICD-10-CM | POA: Diagnosis not present

## 2019-08-31 DIAGNOSIS — C7801 Secondary malignant neoplasm of right lung: Secondary | ICD-10-CM | POA: Diagnosis not present

## 2019-08-31 DIAGNOSIS — G893 Neoplasm related pain (acute) (chronic): Secondary | ICD-10-CM | POA: Diagnosis not present

## 2019-08-31 DIAGNOSIS — J90 Pleural effusion, not elsewhere classified: Secondary | ICD-10-CM | POA: Diagnosis not present

## 2019-08-31 DIAGNOSIS — C7951 Secondary malignant neoplasm of bone: Secondary | ICD-10-CM | POA: Diagnosis not present

## 2019-08-31 DIAGNOSIS — D649 Anemia, unspecified: Secondary | ICD-10-CM | POA: Diagnosis not present

## 2019-08-31 NOTE — Telephone Encounter (Signed)
Call received from patient's daughter, Suzanne [name].  She wanted to report that her mother's BP was 101/67 last night and she held the carvedilol. She had been instructed to hold the carvedilol if her mother's BP is less than 120/70. This morning it was 128/75 and she gave her the carvedilol as ordered.   She explained that the systolic has been [id number] this is the first time that it has dropped below 120.    She wanted to reschedule appointment for her mother that was cancelled in 07/2019.  Informed her that Dr Chapman Fitch was not in the office. She said she would wait until the end of this week and call back to see if the schedule is open for Dr Chapman Fitch.  If not, she will schedule with another provider.

## 2019-09-01 DIAGNOSIS — G893 Neoplasm related pain (acute) (chronic): Secondary | ICD-10-CM | POA: Diagnosis not present

## 2019-09-01 DIAGNOSIS — D649 Anemia, unspecified: Secondary | ICD-10-CM | POA: Diagnosis not present

## 2019-09-01 DIAGNOSIS — R0602 Shortness of breath: Secondary | ICD-10-CM | POA: Diagnosis not present

## 2019-09-01 DIAGNOSIS — C7801 Secondary malignant neoplasm of right lung: Secondary | ICD-10-CM | POA: Diagnosis not present

## 2019-09-01 DIAGNOSIS — J90 Pleural effusion, not elsewhere classified: Secondary | ICD-10-CM | POA: Diagnosis not present

## 2019-09-01 DIAGNOSIS — C50811 Malignant neoplasm of overlapping sites of right female breast: Secondary | ICD-10-CM | POA: Diagnosis not present

## 2019-09-01 DIAGNOSIS — C7951 Secondary malignant neoplasm of bone: Secondary | ICD-10-CM | POA: Diagnosis not present

## 2019-09-02 DIAGNOSIS — R490 Dysphonia: Secondary | ICD-10-CM | POA: Diagnosis not present

## 2019-09-02 DIAGNOSIS — C7951 Secondary malignant neoplasm of bone: Secondary | ICD-10-CM | POA: Diagnosis not present

## 2019-09-02 DIAGNOSIS — C7801 Secondary malignant neoplasm of right lung: Secondary | ICD-10-CM | POA: Diagnosis not present

## 2019-09-02 DIAGNOSIS — M84522D Pathological fracture in neoplastic disease, left humerus, subsequent encounter for fracture with routine healing: Secondary | ICD-10-CM | POA: Diagnosis not present

## 2019-09-02 DIAGNOSIS — J9611 Chronic respiratory failure with hypoxia: Secondary | ICD-10-CM | POA: Diagnosis not present

## 2019-09-02 DIAGNOSIS — G893 Neoplasm related pain (acute) (chronic): Secondary | ICD-10-CM | POA: Diagnosis not present

## 2019-09-02 DIAGNOSIS — J90 Pleural effusion, not elsewhere classified: Secondary | ICD-10-CM | POA: Diagnosis not present

## 2019-09-02 DIAGNOSIS — I10 Essential (primary) hypertension: Secondary | ICD-10-CM | POA: Diagnosis not present

## 2019-09-02 DIAGNOSIS — E669 Obesity, unspecified: Secondary | ICD-10-CM | POA: Diagnosis not present

## 2019-09-02 DIAGNOSIS — M6281 Muscle weakness (generalized): Secondary | ICD-10-CM | POA: Diagnosis not present

## 2019-09-02 DIAGNOSIS — R0602 Shortness of breath: Secondary | ICD-10-CM | POA: Diagnosis not present

## 2019-09-02 DIAGNOSIS — M810 Age-related osteoporosis without current pathological fracture: Secondary | ICD-10-CM | POA: Diagnosis not present

## 2019-09-02 DIAGNOSIS — C50811 Malignant neoplasm of overlapping sites of right female breast: Secondary | ICD-10-CM | POA: Diagnosis not present

## 2019-09-02 DIAGNOSIS — Z9181 History of falling: Secondary | ICD-10-CM | POA: Diagnosis not present

## 2019-09-02 DIAGNOSIS — M47816 Spondylosis without myelopathy or radiculopathy, lumbar region: Secondary | ICD-10-CM | POA: Diagnosis not present

## 2019-09-02 DIAGNOSIS — Z170 Estrogen receptor positive status [ER+]: Secondary | ICD-10-CM | POA: Diagnosis not present

## 2019-09-02 DIAGNOSIS — D649 Anemia, unspecified: Secondary | ICD-10-CM | POA: Diagnosis not present

## 2019-09-02 DIAGNOSIS — D630 Anemia in neoplastic disease: Secondary | ICD-10-CM | POA: Diagnosis not present

## 2019-09-02 DIAGNOSIS — K5900 Constipation, unspecified: Secondary | ICD-10-CM | POA: Diagnosis not present

## 2019-09-02 DIAGNOSIS — M8458XD Pathological fracture in neoplastic disease, other specified site, subsequent encounter for fracture with routine healing: Secondary | ICD-10-CM | POA: Diagnosis not present

## 2019-09-03 DIAGNOSIS — D649 Anemia, unspecified: Secondary | ICD-10-CM | POA: Diagnosis not present

## 2019-09-03 DIAGNOSIS — G893 Neoplasm related pain (acute) (chronic): Secondary | ICD-10-CM | POA: Diagnosis not present

## 2019-09-03 DIAGNOSIS — C50811 Malignant neoplasm of overlapping sites of right female breast: Secondary | ICD-10-CM | POA: Diagnosis not present

## 2019-09-03 DIAGNOSIS — R0602 Shortness of breath: Secondary | ICD-10-CM | POA: Diagnosis not present

## 2019-09-03 DIAGNOSIS — C7801 Secondary malignant neoplasm of right lung: Secondary | ICD-10-CM | POA: Diagnosis not present

## 2019-09-03 DIAGNOSIS — C7951 Secondary malignant neoplasm of bone: Secondary | ICD-10-CM | POA: Diagnosis not present

## 2019-09-03 DIAGNOSIS — J90 Pleural effusion, not elsewhere classified: Secondary | ICD-10-CM | POA: Diagnosis not present

## 2019-09-04 ENCOUNTER — Other Ambulatory Visit: Payer: Self-pay | Admitting: Adult Health

## 2019-09-04 DIAGNOSIS — C50811 Malignant neoplasm of overlapping sites of right female breast: Secondary | ICD-10-CM | POA: Diagnosis not present

## 2019-09-04 DIAGNOSIS — R0602 Shortness of breath: Secondary | ICD-10-CM | POA: Diagnosis not present

## 2019-09-04 DIAGNOSIS — D649 Anemia, unspecified: Secondary | ICD-10-CM | POA: Diagnosis not present

## 2019-09-04 DIAGNOSIS — C7951 Secondary malignant neoplasm of bone: Secondary | ICD-10-CM | POA: Diagnosis not present

## 2019-09-04 DIAGNOSIS — J90 Pleural effusion, not elsewhere classified: Secondary | ICD-10-CM | POA: Diagnosis not present

## 2019-09-04 DIAGNOSIS — G893 Neoplasm related pain (acute) (chronic): Secondary | ICD-10-CM | POA: Diagnosis not present

## 2019-09-04 DIAGNOSIS — C7801 Secondary malignant neoplasm of right lung: Secondary | ICD-10-CM | POA: Diagnosis not present

## 2019-09-04 NOTE — Progress Notes (Signed)
I called the patient today about their upcoming follow-up appointment in radiation oncology.   Given the state of the  COVID-19 pandemic, concerning case numbers in our community, and guidance from [facility], I offered a phone assessment with the patient to determine if coming to the clinic was necessary. The patient accepted.  I let the patient know that I had spoken with Dr. Isidore Moos, and she wanted them to know the importance of washing their hands for at least 20 seconds at a time, especially after going out in public, and before they eat. Limit going out in public whenever possible. Do not touch your face, unless your hands are clean, such as when bathing. Get plenty of rest, eat well, and stay hydrated. Patient verbalized understanding and agreement.  Symptomatically, the patient is doing relatively well. They report no new symptoms or complaints.  All questions were answered to the patient's satisfaction.  I encouraged the patient to call with any further questions. Otherwise, the plan is continue follow up with Dr. Lurline Del and reach out to Dr. Isidore Moos as needed.    Patient is pleased with this plan, and we will cancel their upcoming follow-up to reduce the risk of COVID-19 transmission.

## 2019-09-07 ENCOUNTER — Telehealth: Payer: Self-pay | Admitting: *Deleted

## 2019-09-07 ENCOUNTER — Telehealth: Payer: Self-pay | Admitting: Family Medicine

## 2019-09-07 DIAGNOSIS — R0602 Shortness of breath: Secondary | ICD-10-CM | POA: Diagnosis not present

## 2019-09-07 DIAGNOSIS — C7801 Secondary malignant neoplasm of right lung: Secondary | ICD-10-CM | POA: Diagnosis not present

## 2019-09-07 DIAGNOSIS — J90 Pleural effusion, not elsewhere classified: Secondary | ICD-10-CM | POA: Diagnosis not present

## 2019-09-07 DIAGNOSIS — G893 Neoplasm related pain (acute) (chronic): Secondary | ICD-10-CM | POA: Diagnosis not present

## 2019-09-07 DIAGNOSIS — C7951 Secondary malignant neoplasm of bone: Secondary | ICD-10-CM | POA: Diagnosis not present

## 2019-09-07 DIAGNOSIS — D649 Anemia, unspecified: Secondary | ICD-10-CM | POA: Diagnosis not present

## 2019-09-07 DIAGNOSIS — C50811 Malignant neoplasm of overlapping sites of right female breast: Secondary | ICD-10-CM | POA: Diagnosis not present

## 2019-09-07 NOTE — Telephone Encounter (Signed)
Patients daughter called in to inform pcp that the patients blood pressure was elevated yesterday, reading was 155/99. Patients daughter stated that she called the patients oncology office to report bp to them and they informed her to check the patients bp at next scheduled time and see what it was. Patients daughter did as directed and stated it was within the patients normal range. Patients daughter stated that the patient bp was fine this morning although the patient had to take nausea medication and was just calling to inform pcp.

## 2019-09-07 NOTE — Telephone Encounter (Signed)
Received telephone advice record & also call from daughter earlier to day reporting that pt had a episode of elevated BP with nausea.  Informed pt & daughter that elevated BP may have been from nausea. It came down later after nausea resolved.  Instructed to monitor BP & report to her PCP which she sees this week.

## 2019-09-08 ENCOUNTER — Inpatient Hospital Stay: Payer: Medicaid Other | Attending: Adult Health

## 2019-09-08 ENCOUNTER — Inpatient Hospital Stay: Payer: Medicaid Other

## 2019-09-08 ENCOUNTER — Other Ambulatory Visit: Payer: Self-pay

## 2019-09-08 VITALS — BP 128/76 | HR 76 | Temp 98.0°F | Resp 18

## 2019-09-08 DIAGNOSIS — C7951 Secondary malignant neoplasm of bone: Secondary | ICD-10-CM | POA: Diagnosis not present

## 2019-09-08 DIAGNOSIS — C50811 Malignant neoplasm of overlapping sites of right female breast: Secondary | ICD-10-CM

## 2019-09-08 DIAGNOSIS — J90 Pleural effusion, not elsewhere classified: Secondary | ICD-10-CM

## 2019-09-08 DIAGNOSIS — C50911 Malignant neoplasm of unspecified site of right female breast: Secondary | ICD-10-CM | POA: Diagnosis not present

## 2019-09-08 DIAGNOSIS — R0602 Shortness of breath: Secondary | ICD-10-CM

## 2019-09-08 DIAGNOSIS — G893 Neoplasm related pain (acute) (chronic): Secondary | ICD-10-CM

## 2019-09-08 DIAGNOSIS — Z6841 Body Mass Index (BMI) 40.0 and over, adult: Secondary | ICD-10-CM

## 2019-09-08 DIAGNOSIS — C7801 Secondary malignant neoplasm of right lung: Secondary | ICD-10-CM

## 2019-09-08 DIAGNOSIS — J9621 Acute and chronic respiratory failure with hypoxia: Secondary | ICD-10-CM | POA: Diagnosis not present

## 2019-09-08 DIAGNOSIS — Z170 Estrogen receptor positive status [ER+]: Secondary | ICD-10-CM

## 2019-09-08 DIAGNOSIS — D649 Anemia, unspecified: Secondary | ICD-10-CM

## 2019-09-08 LAB — COMPREHENSIVE METABOLIC PANEL
ALT: 13 U/L (ref 0–44)
AST: 17 U/L (ref 15–41)
Albumin: 3.6 g/dL (ref 3.5–5.0)
Alkaline Phosphatase: 125 U/L (ref 38–126)
Anion gap: 8 (ref 5–15)
BUN: 24 mg/dL — ABNORMAL HIGH (ref 6–20)
CO2: 31 mmol/L (ref 22–32)
Calcium: 9.2 mg/dL (ref 8.9–10.3)
Chloride: 101 mmol/L (ref 98–111)
Creatinine, Ser: 0.97 mg/dL (ref 0.44–1.00)
GFR calc Af Amer: >60 mL/min (ref >60)
GFR calc non Af Amer: >60 mL/min (ref >60)
Glucose, Bld: 109 mg/dL — ABNORMAL HIGH (ref 70–99)
Potassium: 4.1 mmol/L (ref 3.5–5.1)
Sodium: 140 mmol/L (ref 135–145)
Total Bilirubin: 0.3 mg/dL (ref 0.3–1.2)
Total Protein: 7.3 g/dL (ref 6.5–8.1)

## 2019-09-08 LAB — CBC WITH DIFFERENTIAL/PLATELET
Abs Immature Granulocytes: 0.01 10*3/uL (ref 0.00–0.07)
Basophils Absolute: 0.1 10*3/uL (ref 0.0–0.1)
Basophils Relative: 2 %
Eosinophils Absolute: 0 10*3/uL (ref 0.0–0.5)
Eosinophils Relative: 2 %
HCT: 30.9 % — ABNORMAL LOW (ref 36.0–46.0)
Hemoglobin: 9.9 g/dL — ABNORMAL LOW (ref 12.0–15.0)
Immature Granulocytes: 0 %
Lymphocytes Relative: 38 %
Lymphs Abs: 1 10*3/uL (ref 0.7–4.0)
MCH: 36.4 pg — ABNORMAL HIGH (ref 26.0–34.0)
MCHC: 32 g/dL (ref 30.0–36.0)
MCV: 113.6 fL — ABNORMAL HIGH (ref 80.0–100.0)
Monocytes Absolute: 0.2 10*3/uL (ref 0.1–1.0)
Monocytes Relative: 6 %
Neutro Abs: 1.4 10*3/uL — ABNORMAL LOW (ref 1.7–7.7)
Neutrophils Relative %: 52 %
Platelets: 312 10*3/uL (ref 150–400)
RBC: 2.72 MIL/uL — ABNORMAL LOW (ref 3.87–5.11)
RDW: 15.2 % (ref 11.5–15.5)
WBC: 2.6 10*3/uL — ABNORMAL LOW (ref 4.0–10.5)
nRBC: 0 % (ref 0.0–0.2)

## 2019-09-08 MED ORDER — DENOSUMAB 120 MG/1.7ML ~~LOC~~ SOLN
120.0000 mg | Freq: Once | SUBCUTANEOUS | Status: AC
  Administered 2019-09-08: 120 mg via SUBCUTANEOUS

## 2019-09-08 MED ORDER — DENOSUMAB 120 MG/1.7ML ~~LOC~~ SOLN
SUBCUTANEOUS | Status: AC
  Filled 2019-09-08: qty 1.7

## 2019-09-09 ENCOUNTER — Ambulatory Visit
Admission: RE | Admit: 2019-09-09 | Discharge: 2019-09-09 | Disposition: A | Discharge status: Home / Self Care | Payer: Medicaid Other | Source: Ambulatory Visit | Attending: Radiation Oncology | Admitting: Radiation Oncology

## 2019-09-09 DIAGNOSIS — M810 Age-related osteoporosis without current pathological fracture: Secondary | ICD-10-CM | POA: Diagnosis not present

## 2019-09-09 DIAGNOSIS — M6281 Muscle weakness (generalized): Secondary | ICD-10-CM | POA: Diagnosis not present

## 2019-09-09 DIAGNOSIS — I10 Essential (primary) hypertension: Secondary | ICD-10-CM | POA: Diagnosis not present

## 2019-09-09 DIAGNOSIS — C7951 Secondary malignant neoplasm of bone: Secondary | ICD-10-CM | POA: Diagnosis not present

## 2019-09-09 DIAGNOSIS — M8458XD Pathological fracture in neoplastic disease, other specified site, subsequent encounter for fracture with routine healing: Secondary | ICD-10-CM | POA: Diagnosis not present

## 2019-09-09 DIAGNOSIS — R490 Dysphonia: Secondary | ICD-10-CM | POA: Diagnosis not present

## 2019-09-09 DIAGNOSIS — M84522D Pathological fracture in neoplastic disease, left humerus, subsequent encounter for fracture with routine healing: Secondary | ICD-10-CM | POA: Diagnosis not present

## 2019-09-09 DIAGNOSIS — G893 Neoplasm related pain (acute) (chronic): Secondary | ICD-10-CM | POA: Diagnosis not present

## 2019-09-09 DIAGNOSIS — C50811 Malignant neoplasm of overlapping sites of right female breast: Secondary | ICD-10-CM | POA: Diagnosis not present

## 2019-09-09 DIAGNOSIS — J90 Pleural effusion, not elsewhere classified: Secondary | ICD-10-CM | POA: Diagnosis not present

## 2019-09-09 DIAGNOSIS — R0602 Shortness of breath: Secondary | ICD-10-CM | POA: Diagnosis not present

## 2019-09-09 DIAGNOSIS — E669 Obesity, unspecified: Secondary | ICD-10-CM | POA: Diagnosis not present

## 2019-09-09 DIAGNOSIS — Z170 Estrogen receptor positive status [ER+]: Secondary | ICD-10-CM | POA: Diagnosis not present

## 2019-09-09 DIAGNOSIS — M47816 Spondylosis without myelopathy or radiculopathy, lumbar region: Secondary | ICD-10-CM | POA: Diagnosis not present

## 2019-09-09 DIAGNOSIS — Z9181 History of falling: Secondary | ICD-10-CM | POA: Diagnosis not present

## 2019-09-09 DIAGNOSIS — J9611 Chronic respiratory failure with hypoxia: Secondary | ICD-10-CM | POA: Diagnosis not present

## 2019-09-09 DIAGNOSIS — K5900 Constipation, unspecified: Secondary | ICD-10-CM | POA: Diagnosis not present

## 2019-09-09 DIAGNOSIS — D649 Anemia, unspecified: Secondary | ICD-10-CM | POA: Diagnosis not present

## 2019-09-09 DIAGNOSIS — C7801 Secondary malignant neoplasm of right lung: Secondary | ICD-10-CM | POA: Diagnosis not present

## 2019-09-09 DIAGNOSIS — D630 Anemia in neoplastic disease: Secondary | ICD-10-CM | POA: Diagnosis not present

## 2019-09-09 LAB — CANCER ANTIGEN 27.29: CA 27.29: 41.4 U/mL — ABNORMAL HIGH (ref 0.0–38.6)

## 2019-09-09 NOTE — Progress Notes (Signed)
  Patient Name: Suzanne [name] MRN: [id number] DOB: 1960-08-24 Referring Physician: Chapman Fitch CAMMIE (Profile Not Attached) Date of Service: 07/31/2019 [facility], [place]                                                        End Of Treatment Note  Diagnoses: C79.51-Secondary malignant neoplasm of bone  Cancer Staging: STAGE IV Breast Cancer  Intent: Palliative  Radiation Treatment Dates: 07/20/2019 through 07/31/2019 Site Technique Total Dose (Gy) Dose per Fx (Gy) Completed Fx Beam Energies  Humerus, Left: Ext_Lt_humerus Complex 30/30 3 10/10 6X   Narrative: The patient tolerated radiation therapy relatively well.   Plan: The patient will follow-up with radiation oncology in 1 mo or as needed.  -----------------------------------  Eppie Gibson, MD

## 2019-09-10 DIAGNOSIS — C7951 Secondary malignant neoplasm of bone: Secondary | ICD-10-CM | POA: Diagnosis not present

## 2019-09-10 DIAGNOSIS — C7801 Secondary malignant neoplasm of right lung: Secondary | ICD-10-CM | POA: Diagnosis not present

## 2019-09-10 DIAGNOSIS — G893 Neoplasm related pain (acute) (chronic): Secondary | ICD-10-CM | POA: Diagnosis not present

## 2019-09-10 DIAGNOSIS — J90 Pleural effusion, not elsewhere classified: Secondary | ICD-10-CM | POA: Diagnosis not present

## 2019-09-10 DIAGNOSIS — R0602 Shortness of breath: Secondary | ICD-10-CM | POA: Diagnosis not present

## 2019-09-10 DIAGNOSIS — C50811 Malignant neoplasm of overlapping sites of right female breast: Secondary | ICD-10-CM | POA: Diagnosis not present

## 2019-09-10 DIAGNOSIS — D649 Anemia, unspecified: Secondary | ICD-10-CM | POA: Diagnosis not present

## 2019-09-11 ENCOUNTER — Ambulatory Visit: Payer: Medicaid Other

## 2019-09-11 DIAGNOSIS — R0602 Shortness of breath: Secondary | ICD-10-CM | POA: Diagnosis not present

## 2019-09-11 DIAGNOSIS — C7951 Secondary malignant neoplasm of bone: Secondary | ICD-10-CM | POA: Diagnosis not present

## 2019-09-11 DIAGNOSIS — C50811 Malignant neoplasm of overlapping sites of right female breast: Secondary | ICD-10-CM | POA: Diagnosis not present

## 2019-09-11 DIAGNOSIS — D649 Anemia, unspecified: Secondary | ICD-10-CM | POA: Diagnosis not present

## 2019-09-11 DIAGNOSIS — J90 Pleural effusion, not elsewhere classified: Secondary | ICD-10-CM | POA: Diagnosis not present

## 2019-09-11 DIAGNOSIS — C7801 Secondary malignant neoplasm of right lung: Secondary | ICD-10-CM | POA: Diagnosis not present

## 2019-09-11 DIAGNOSIS — G893 Neoplasm related pain (acute) (chronic): Secondary | ICD-10-CM | POA: Diagnosis not present

## 2019-09-14 ENCOUNTER — Ambulatory Visit: Payer: Medicaid Other | Attending: Internal Medicine

## 2019-09-14 DIAGNOSIS — C7801 Secondary malignant neoplasm of right lung: Secondary | ICD-10-CM | POA: Diagnosis not present

## 2019-09-14 DIAGNOSIS — C50811 Malignant neoplasm of overlapping sites of right female breast: Secondary | ICD-10-CM | POA: Diagnosis not present

## 2019-09-14 DIAGNOSIS — D649 Anemia, unspecified: Secondary | ICD-10-CM | POA: Diagnosis not present

## 2019-09-14 DIAGNOSIS — C7951 Secondary malignant neoplasm of bone: Secondary | ICD-10-CM | POA: Diagnosis not present

## 2019-09-14 DIAGNOSIS — J90 Pleural effusion, not elsewhere classified: Secondary | ICD-10-CM | POA: Diagnosis not present

## 2019-09-14 DIAGNOSIS — R0602 Shortness of breath: Secondary | ICD-10-CM | POA: Diagnosis not present

## 2019-09-14 DIAGNOSIS — Z23 Encounter for immunization: Secondary | ICD-10-CM

## 2019-09-14 DIAGNOSIS — G893 Neoplasm related pain (acute) (chronic): Secondary | ICD-10-CM | POA: Diagnosis not present

## 2019-09-14 NOTE — Progress Notes (Signed)
   Covid-19 Vaccination Clinic  Name:  Suzanne [name]    MRN: [id number] DOB: 06-13-1960  09/14/2019  [name] was observed post Covid-19 immunization for 15 minutes without incident. She was provided with Vaccine Information Sheet and instruction to access the V-Safe system.   [name] was instructed to call 911 with any severe reactions post vaccine: Marland Kitchen Difficulty breathing  . Swelling of face and throat  . A fast heartbeat  . A bad rash all over body  . Dizziness and weakness   Immunizations Administered    Name Date Dose VIS Date Route   Pfizer COVID-19 Vaccine 09/14/2019  3:40 PM 0.3 mL 07/22/2018 Intramuscular   Manufacturer: [place]   Lot: [id number]   [place]: [id number]

## 2019-09-16 DIAGNOSIS — M810 Age-related osteoporosis without current pathological fracture: Secondary | ICD-10-CM | POA: Diagnosis not present

## 2019-09-16 DIAGNOSIS — C50811 Malignant neoplasm of overlapping sites of right female breast: Secondary | ICD-10-CM | POA: Diagnosis not present

## 2019-09-16 DIAGNOSIS — C7951 Secondary malignant neoplasm of bone: Secondary | ICD-10-CM | POA: Diagnosis not present

## 2019-09-16 DIAGNOSIS — I10 Essential (primary) hypertension: Secondary | ICD-10-CM | POA: Diagnosis not present

## 2019-09-16 DIAGNOSIS — M47816 Spondylosis without myelopathy or radiculopathy, lumbar region: Secondary | ICD-10-CM | POA: Diagnosis not present

## 2019-09-16 DIAGNOSIS — J90 Pleural effusion, not elsewhere classified: Secondary | ICD-10-CM | POA: Diagnosis not present

## 2019-09-16 DIAGNOSIS — D649 Anemia, unspecified: Secondary | ICD-10-CM | POA: Diagnosis not present

## 2019-09-16 DIAGNOSIS — G893 Neoplasm related pain (acute) (chronic): Secondary | ICD-10-CM | POA: Diagnosis not present

## 2019-09-16 DIAGNOSIS — M84522D Pathological fracture in neoplastic disease, left humerus, subsequent encounter for fracture with routine healing: Secondary | ICD-10-CM | POA: Diagnosis not present

## 2019-09-16 DIAGNOSIS — R0602 Shortness of breath: Secondary | ICD-10-CM | POA: Diagnosis not present

## 2019-09-16 DIAGNOSIS — J9611 Chronic respiratory failure with hypoxia: Secondary | ICD-10-CM | POA: Diagnosis not present

## 2019-09-16 DIAGNOSIS — Z170 Estrogen receptor positive status [ER+]: Secondary | ICD-10-CM | POA: Diagnosis not present

## 2019-09-16 DIAGNOSIS — D630 Anemia in neoplastic disease: Secondary | ICD-10-CM | POA: Diagnosis not present

## 2019-09-16 DIAGNOSIS — M8458XD Pathological fracture in neoplastic disease, other specified site, subsequent encounter for fracture with routine healing: Secondary | ICD-10-CM | POA: Diagnosis not present

## 2019-09-16 DIAGNOSIS — E669 Obesity, unspecified: Secondary | ICD-10-CM | POA: Diagnosis not present

## 2019-09-16 DIAGNOSIS — C7801 Secondary malignant neoplasm of right lung: Secondary | ICD-10-CM | POA: Diagnosis not present

## 2019-09-16 DIAGNOSIS — R490 Dysphonia: Secondary | ICD-10-CM | POA: Diagnosis not present

## 2019-09-16 DIAGNOSIS — Z9181 History of falling: Secondary | ICD-10-CM | POA: Diagnosis not present

## 2019-09-16 DIAGNOSIS — M6281 Muscle weakness (generalized): Secondary | ICD-10-CM | POA: Diagnosis not present

## 2019-09-16 DIAGNOSIS — K5900 Constipation, unspecified: Secondary | ICD-10-CM | POA: Diagnosis not present

## 2019-09-16 NOTE — Progress Notes (Signed)
Virtual Visit via Telephone Note  I connected with Suzanne [name], on 09/18/2019 at 8:06 AM by telephone due to the COVID-19 pandemic and verified that I am speaking with the correct person using two identifiers.  Due to current restrictions/limitations of in-office visits due to the COVID-19 pandemic, this scheduled clinical appointment was converted to a telehealth visit.   Consent: I discussed the limitations, risks, security and privacy concerns of performing an evaluation and management service by telephone and the availability of in person appointments. I also discussed with the patient that there may be a patient responsible charge related to this service. The patient expressed understanding and agreed to proceed.  Location of Patient: Home  Location of Provider: [facility] [place]  Persons participating in Telemedicine visit: [name], NP Emilio Aspen, CMA  History of Present Illness: Suzanne [name]. 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 BLOOD PRESSURE FOLLOW-UP:  Currently taking: see medication list Med Adherence: []  Yes    [x]  No Medication side effects: []  Yes    [x]  No Adherence with salt restriction: [x]  Yes    []  No Exercise: Yes [x]  No []   Home Monitoring?: [x]  Yes    []  No Monitoring Frequency: [x]  Yes, twice daily Home BP results range: [x]  Yes, 145/81 last night, 133/86 yesterday morning, 146/75 on 09/16/2019  Smoking []  Yes [x]  No SOB? []  Yes    [x]  No Chest Pain?: []  Yes    [x]  No Leg swelling?: []  Yes    [x]  No Headaches?: [x]  Yes, sometimes, believes related to allergies, Takes  Ibuprofen which helps Dizziness? [x]  Yes, occasionally when laying down flat, last happened 1 week ago   Comments: Reports she thinks she took blood pressure medication (Carvedilol) on last night but she can't remember. Reports she thinks she took the blood pressure medication (Carvedilol) 4 days ago but she cant remember. States she tells her daughter her blood pressure and her daughter will tell her if she needs to take it or not based off the blood pressure reading. Patient's daughter states that she lets her mother decide when to take the blood pressure medication based on how her mother feels at the moment.   Patient reports that she feels confused because she is not sure when to take the blood pressure medication and when not to take it. Patient and her daughter report they were told to take the blood pressure medication if the blood pressure is 140/90 or higher. They also report that about 1 week later they were told to take the blood pressure medication if the blood pressure is 120/70 or higher.    Last visit February 2021 with Dr. Chapman Fitch. During that encounter an order was placed for patient to receive a blood pressure monitor. Patient's home health and daughter reported that patient had low blood pressure readings with dizziness. Dizziness occurred with changes in position. No dizziness while lying down. Carvedilol was held and consideration to adjust dose for Lasix if her blood pressures continued to remain low. Patient was told to hold Carvedilol and to restart if blood pressure is greater than 140/90. Patient's daughter reported they were instructed to divide the dose of Lasix by taking 10 mg  twice per day. BMP was scheduled to be checked the following day to make sure the patient was not having electrolyte abnormalities or dehydration.  2. EYE DOCTOR UPDATE: Patient reports she was referred to an eye doctor because of concern of high fluid pressure on the eye and possible glaucoma. States she  had an appointment with eye doctor at the end of March 2021 and it was determined she does not have glaucoma but that she does have a cataract on the left eye. Surgery for cataract removal on 09/24/2019 with follow-up the next day on 09/25/2019.  Past Medical History:  Diagnosis Date  . Cancer ([place])   . Family history of breast cancer   . Family history of colon cancer   . Family history of melanoma   . Family history of prostate cancer   . Gallstones   . Glaucoma   . Mastitis    reports history of recurrent mastitis  . Metastatic breast cancer ([place])   . Obesity    No Known Allergies  Current Outpatient Medications on File Prior to Visit  Medication Sig Dispense Refill  . albuterol (VENTOLIN HFA) 108 (90 Base) MCG/ACT inhaler Inhale 2 puffs into the lungs every 6 (six) hours as needed for wheezing or shortness of breath. 16 g 3  . ALPRAZolam (XANAX) 0.25 MG tablet Take 1-2 tablets (0.25-0.5 mg total) by mouth every 8 (eight) hours as needed for anxiety. Prescription needed for SNF placement 5 tablet 0  . anastrozole (ARIMIDEX) 1 MG tablet Take 1 tablet (1 mg total) by mouth daily. 30 tablet 3  . antiseptic oral rinse (BIOTENE) LIQD 15 mLs by Mouth Rinse route as needed for dry mouth.    . benzonatate (TESSALON) 200 MG capsule TAKE 1 CAPSULE BY MOUTH 3 TIMES DAILY IF NEEDED 30 capsule 0  . Blood Pressure Monitor MISC Use to check blood pressure twice per day and more often if not feeling well 1 each 0  . calcium-vitamin D (OSCAL WITH D) 500-200 MG-UNIT tablet Take 1 tablet by mouth 3 (three) times daily. 90 tablet 1  . carvedilol (COREG) 3.125 MG tablet Take 1 tablet (3.125 mg total) by mouth 2 (two) times daily with a meal. 60 tablet 0  . fluconazole (DIFLUCAN) 100 MG tablet Take 1 tablet (100 mg total) by mouth daily. Take medication for 10 days and then take only as needed. (Patient not taking: Reported on 07/24/2019) 90 tablet 3  . furosemide (LASIX) 20 MG tablet Take 1 tablet (20 mg  total) by mouth daily. 90 tablet 4  . ketoconazole (NIZORAL) 2 % cream APPLY EXTERNALLY TO THE AFFECTED AREA DAILY 30 g 1  . loratadine (CLARITIN) 10 MG tablet Take 1 tablet (10 mg total) by mouth daily. 30 tablet 3  . nystatin (MYCOSTATIN/NYSTOP) powder Apply topically 3 (three) times daily. Apply under breast area 15 g 1  . palbociclib (IBRANCE) 100 MG capsule Take 1 capsule (100 mg total) by mouth daily with breakfast. Take whole with food. Take for 21 days on, 7 days off, repeat every 28 days. 21 capsule 6  . polyethylene glycol (MIRALAX / GLYCOLAX) 17 g packet Take 17 g by mouth as needed for constipation.    . senna (SENOKOT) 8.6 MG TABS tablet Take 1 tablet (8.6 mg total) by mouth 2 (two) times daily. 120 tablet 0  . sertraline (ZOLOFT) 50 MG tablet Two pills once per day at bedtime 60 tablet 4  . ZOFRAN 8 MG tablet Take 8 mg  by mouth every 8 (eight) hours.     No current facility-administered medications on file prior to visit.    Observations/Objective: Alert and oriented x 3. Not in acute distress. Physical examination not completed as this is a telemedicine visit.  Assessment and Plan: 1. Hypotension, unspecified hypotension type: -Counseled patient that she should continue to hold Carvedilol. Patient should restart Carvedilol if blood pressure is 140/90 or greater. Counseled patient that Carvedilol should be held if the blood pressure is 120/70 or less.  -Patient and her daughter thought they were supposed to give the blood pressure medicine if her blood pressure is 120/70 or higher which may be contributing to episodic hypotension and dizziness. -Blood pressures at home have been 130s-140s/70s-80s of which patient reports sometimes she will take the Carvedilol is she has systolic readings of 0000000. Reports her diastolic numbers typically stay in the 70s-80s. -Continue to check blood pressure at least twice daily at home and record results.  2. Essential hypertension: -Counseled  patient that she should continue to hold Carvedilol. Patient should restart Carvedilol if blood pressure is 140/90 or greater. Counseled patient that Carvedilol should be held if the blood pressure is 120/70 or less. -Patient currently prescribed Carvedilol Take 1 tablet (3.125 mg total) by mouth 2 (two) times daily with a meal. -Continue to check blood pressure at least twice daily at home and record results.  Follow Up Instructions: Follow-up with primary physician as needed.   Patient was given clear instructions to go to Emergency Department or return to medical center if symptoms don't improve, worsen, or new problems develop.The patient verbalized understanding.  I discussed the assessment and treatment plan with the patient. The patient was provided an opportunity to ask questions and all were answered. The patient agreed with the plan and demonstrated an understanding of the instructions.   The patient was advised to call back or seek an in-person evaluation if the symptoms worsen or if the condition fails to improve as anticipated.   I provided 27 minutes total of non-face-to-face time during this encounter including median intraservice time, reviewing previous notes, labs, imaging, medications, management and patient verbalized understanding.    Camillia Herter, NP  [facility] [place], [place]   09/18/2019, 8:06 AM

## 2019-09-17 DIAGNOSIS — R0602 Shortness of breath: Secondary | ICD-10-CM | POA: Diagnosis not present

## 2019-09-17 DIAGNOSIS — G893 Neoplasm related pain (acute) (chronic): Secondary | ICD-10-CM | POA: Diagnosis not present

## 2019-09-17 DIAGNOSIS — J90 Pleural effusion, not elsewhere classified: Secondary | ICD-10-CM | POA: Diagnosis not present

## 2019-09-17 DIAGNOSIS — C7951 Secondary malignant neoplasm of bone: Secondary | ICD-10-CM | POA: Diagnosis not present

## 2019-09-17 DIAGNOSIS — C7801 Secondary malignant neoplasm of right lung: Secondary | ICD-10-CM | POA: Diagnosis not present

## 2019-09-17 DIAGNOSIS — D649 Anemia, unspecified: Secondary | ICD-10-CM | POA: Diagnosis not present

## 2019-09-17 DIAGNOSIS — C50811 Malignant neoplasm of overlapping sites of right female breast: Secondary | ICD-10-CM | POA: Diagnosis not present

## 2019-09-18 ENCOUNTER — Other Ambulatory Visit: Payer: Self-pay

## 2019-09-18 ENCOUNTER — Ambulatory Visit: Payer: Medicaid Other | Attending: Family | Admitting: Family

## 2019-09-18 ENCOUNTER — Encounter: Payer: Self-pay | Admitting: Family

## 2019-09-18 DIAGNOSIS — C50811 Malignant neoplasm of overlapping sites of right female breast: Secondary | ICD-10-CM | POA: Diagnosis not present

## 2019-09-18 DIAGNOSIS — C7801 Secondary malignant neoplasm of right lung: Secondary | ICD-10-CM | POA: Diagnosis not present

## 2019-09-18 DIAGNOSIS — I959 Hypotension, unspecified: Secondary | ICD-10-CM | POA: Insufficient documentation

## 2019-09-18 DIAGNOSIS — R0602 Shortness of breath: Secondary | ICD-10-CM | POA: Diagnosis not present

## 2019-09-18 DIAGNOSIS — Z85118 Personal history of other malignant neoplasm of bronchus and lung: Secondary | ICD-10-CM | POA: Insufficient documentation

## 2019-09-18 DIAGNOSIS — G893 Neoplasm related pain (acute) (chronic): Secondary | ICD-10-CM | POA: Diagnosis not present

## 2019-09-18 DIAGNOSIS — Z79899 Other long term (current) drug therapy: Secondary | ICD-10-CM | POA: Diagnosis not present

## 2019-09-18 DIAGNOSIS — J9611 Chronic respiratory failure with hypoxia: Secondary | ICD-10-CM | POA: Diagnosis not present

## 2019-09-18 DIAGNOSIS — I10 Essential (primary) hypertension: Secondary | ICD-10-CM | POA: Diagnosis not present

## 2019-09-18 DIAGNOSIS — Z6841 Body Mass Index (BMI) 40.0 and over, adult: Secondary | ICD-10-CM | POA: Diagnosis not present

## 2019-09-18 DIAGNOSIS — Z853 Personal history of malignant neoplasm of breast: Secondary | ICD-10-CM | POA: Diagnosis not present

## 2019-09-18 DIAGNOSIS — C7951 Secondary malignant neoplasm of bone: Secondary | ICD-10-CM | POA: Diagnosis not present

## 2019-09-18 DIAGNOSIS — D649 Anemia, unspecified: Secondary | ICD-10-CM | POA: Diagnosis not present

## 2019-09-18 DIAGNOSIS — J90 Pleural effusion, not elsewhere classified: Secondary | ICD-10-CM | POA: Diagnosis not present

## 2019-09-18 NOTE — Patient Instructions (Signed)
Follow-up with primary physician as needed. Hypotension As your heart beats, it forces blood through your body. This force is called blood pressure. If you have hypotension, you have low blood pressure. When your blood pressure is too low, you may not get enough blood to your brain or other parts of your body. This may cause you to feel weak, light-headed, have a fast heartbeat, or even pass out (faint). Low blood pressure may be harmless, or it may cause serious problems. What are the causes?  Blood loss.  Not enough water in the body (dehydration).  Heart problems.  Hormone problems.  Pregnancy.  A very bad infection.  Not having enough of certain nutrients.  Very bad allergic reactions.  Certain medicines. What increases the risk?  Age. The risk increases as you get older.  Conditions that affect the heart or the brain and spinal cord (central nervous system).  Taking certain medicines.  Being pregnant. What are the signs or symptoms?  Feeling: ? Weak. ? Light-headed. ? Dizzy. ? Tired (fatigued).  Blurred vision.  Fast heartbeat.  Passing out, in very bad cases. How is this treated?  Changing your diet. This may involve eating more salt (sodium) or drinking more water.  Taking medicines to raise your blood pressure.  Changing how much you take (the dosage) of some of your medicines.  Wearing compression stockings. These stockings help to prevent blood clots and reduce swelling in your legs. In some cases, you may need to go to the hospital for:  Fluid replacement. This means you will receive fluids through an IV tube.  Blood replacement. This means you will receive donated blood through an IV tube (transfusion).  Treating an infection or heart problems, if this applies.  Monitoring. You may need to be monitored while medicines that you are taking wear off. Follow these instructions at home: Eating and drinking   Drink enough fluids to keep your pee  (urine) pale yellow.  Eat a healthy diet. Follow instructions from your doctor about what you can eat or drink. A healthy diet includes: ? Fresh fruits and vegetables. ? Whole grains. ? Low-fat (lean) meats. ? Low-fat dairy products.  Eat extra salt only as told. Do not add extra salt to your diet unless your doctor tells you to.  Eat small meals often.  Avoid standing up quickly after you eat. Medicines  Take over-the-counter and prescription medicines only as told by your doctor. ? Follow instructions from your doctor about changing how much you take of your medicines, if this applies. ? Do not stop or change any of your medicines on your own. General instructions   Wear compression stockings as told by your doctor.  Get up slowly from lying down or sitting.  Avoid hot showers and a lot of heat as told by your doctor.  Return to your normal activities as told by your doctor. Ask what activities are safe for you.  Do not use any products that contain nicotine or tobacco, such as cigarettes, e-cigarettes, and chewing tobacco. If you need help quitting, ask your doctor.  Keep all follow-up visits as told by your doctor. This is important. Contact a doctor if:  You throw up (vomit).  You have watery poop (diarrhea).  You have a fever for more than 2-3 days.  You feel more thirsty than normal.  You feel weak and tired. Get help right away if:  You have chest pain.  You have a fast or uneven heartbeat.  You  lose feeling (have numbness) in any part of your body.  You cannot move your arms or your legs.  You have trouble talking.  You get sweaty or feel light-headed.  You pass out.  You have trouble breathing.  You have trouble staying awake.  You feel mixed up (confused). Summary  Hypotension is also called low blood pressure. It is when the force of blood pumping through your arteries is too weak.  Hypotension may be harmless, or it may cause serious  problems.  Treatment may include changing your diet and medicines, and wearing compression stockings.  In very bad cases, you may need to go to the hospital. This information is not intended to replace advice given to you by your health care provider. Make sure you discuss any questions you have with your health care provider. Document Revised: 11/07/2017 Document Reviewed: 11/07/2017 Elsevier Patient Education  [place].

## 2019-09-18 NOTE — Progress Notes (Deleted)
  Patient Name: Suzanne [name] MRN: [id number] DOB: 1960-11-21 Referring Physician: Chapman Fitch CAMMIE (Profile Not Attached) Date of Service: 09/09/2019 [facility], [place]                                                        End Of Treatment Note  Diagnoses: C79.51-Secondary malignant neoplasm of bone  Cancer Staging: Stage IV  Intent: Palliative  Radiation Treatment Dates: 07/20/2019 through 07/31/2019  Site Technique Total Dose (Gy) Dose per Fx (Gy) Completed Fx Beam Energies  Humerus, Left: Ext_Lt_humerus Complex 30/30 3 10/10 6X   Narrative: The patient tolerated radiation therapy relatively well.   Plan: The patient will follow-up with radiation oncology in 1 mo, or as needed. -----------------------------------  Eppie Gibson, MD

## 2019-09-21 DIAGNOSIS — R0602 Shortness of breath: Secondary | ICD-10-CM | POA: Diagnosis not present

## 2019-09-21 DIAGNOSIS — C50811 Malignant neoplasm of overlapping sites of right female breast: Secondary | ICD-10-CM | POA: Diagnosis not present

## 2019-09-21 DIAGNOSIS — J90 Pleural effusion, not elsewhere classified: Secondary | ICD-10-CM | POA: Diagnosis not present

## 2019-09-21 DIAGNOSIS — D649 Anemia, unspecified: Secondary | ICD-10-CM | POA: Diagnosis not present

## 2019-09-21 DIAGNOSIS — G893 Neoplasm related pain (acute) (chronic): Secondary | ICD-10-CM | POA: Diagnosis not present

## 2019-09-21 DIAGNOSIS — C7801 Secondary malignant neoplasm of right lung: Secondary | ICD-10-CM | POA: Diagnosis not present

## 2019-09-21 DIAGNOSIS — C7951 Secondary malignant neoplasm of bone: Secondary | ICD-10-CM | POA: Diagnosis not present

## 2019-09-22 DIAGNOSIS — D649 Anemia, unspecified: Secondary | ICD-10-CM | POA: Diagnosis not present

## 2019-09-22 DIAGNOSIS — R0602 Shortness of breath: Secondary | ICD-10-CM | POA: Diagnosis not present

## 2019-09-22 DIAGNOSIS — C7801 Secondary malignant neoplasm of right lung: Secondary | ICD-10-CM | POA: Diagnosis not present

## 2019-09-22 DIAGNOSIS — C7951 Secondary malignant neoplasm of bone: Secondary | ICD-10-CM | POA: Diagnosis not present

## 2019-09-22 DIAGNOSIS — C50811 Malignant neoplasm of overlapping sites of right female breast: Secondary | ICD-10-CM | POA: Diagnosis not present

## 2019-09-22 DIAGNOSIS — G893 Neoplasm related pain (acute) (chronic): Secondary | ICD-10-CM | POA: Diagnosis not present

## 2019-09-22 DIAGNOSIS — J90 Pleural effusion, not elsewhere classified: Secondary | ICD-10-CM | POA: Diagnosis not present

## 2019-09-23 DIAGNOSIS — M47816 Spondylosis without myelopathy or radiculopathy, lumbar region: Secondary | ICD-10-CM | POA: Diagnosis not present

## 2019-09-23 DIAGNOSIS — M8458XD Pathological fracture in neoplastic disease, other specified site, subsequent encounter for fracture with routine healing: Secondary | ICD-10-CM | POA: Diagnosis not present

## 2019-09-23 DIAGNOSIS — K5900 Constipation, unspecified: Secondary | ICD-10-CM | POA: Diagnosis not present

## 2019-09-23 DIAGNOSIS — M84522D Pathological fracture in neoplastic disease, left humerus, subsequent encounter for fracture with routine healing: Secondary | ICD-10-CM | POA: Diagnosis not present

## 2019-09-23 DIAGNOSIS — M6281 Muscle weakness (generalized): Secondary | ICD-10-CM | POA: Diagnosis not present

## 2019-09-23 DIAGNOSIS — J9611 Chronic respiratory failure with hypoxia: Secondary | ICD-10-CM | POA: Diagnosis not present

## 2019-09-23 DIAGNOSIS — R490 Dysphonia: Secondary | ICD-10-CM | POA: Diagnosis not present

## 2019-09-23 DIAGNOSIS — J90 Pleural effusion, not elsewhere classified: Secondary | ICD-10-CM | POA: Diagnosis not present

## 2019-09-23 DIAGNOSIS — C7951 Secondary malignant neoplasm of bone: Secondary | ICD-10-CM | POA: Diagnosis not present

## 2019-09-23 DIAGNOSIS — C50811 Malignant neoplasm of overlapping sites of right female breast: Secondary | ICD-10-CM | POA: Diagnosis not present

## 2019-09-23 DIAGNOSIS — R0602 Shortness of breath: Secondary | ICD-10-CM | POA: Diagnosis not present

## 2019-09-23 DIAGNOSIS — I10 Essential (primary) hypertension: Secondary | ICD-10-CM | POA: Diagnosis not present

## 2019-09-23 DIAGNOSIS — E669 Obesity, unspecified: Secondary | ICD-10-CM | POA: Diagnosis not present

## 2019-09-23 DIAGNOSIS — G893 Neoplasm related pain (acute) (chronic): Secondary | ICD-10-CM | POA: Diagnosis not present

## 2019-09-23 DIAGNOSIS — Z9181 History of falling: Secondary | ICD-10-CM | POA: Diagnosis not present

## 2019-09-23 DIAGNOSIS — C7801 Secondary malignant neoplasm of right lung: Secondary | ICD-10-CM | POA: Diagnosis not present

## 2019-09-23 DIAGNOSIS — D630 Anemia in neoplastic disease: Secondary | ICD-10-CM | POA: Diagnosis not present

## 2019-09-23 DIAGNOSIS — Z170 Estrogen receptor positive status [ER+]: Secondary | ICD-10-CM | POA: Diagnosis not present

## 2019-09-23 DIAGNOSIS — D649 Anemia, unspecified: Secondary | ICD-10-CM | POA: Diagnosis not present

## 2019-09-23 DIAGNOSIS — M810 Age-related osteoporosis without current pathological fracture: Secondary | ICD-10-CM | POA: Diagnosis not present

## 2019-09-24 DIAGNOSIS — H25812 Combined forms of age-related cataract, left eye: Secondary | ICD-10-CM | POA: Diagnosis not present

## 2019-09-24 DIAGNOSIS — H268 Other specified cataract: Secondary | ICD-10-CM | POA: Diagnosis not present

## 2019-09-26 DIAGNOSIS — J9621 Acute and chronic respiratory failure with hypoxia: Secondary | ICD-10-CM | POA: Diagnosis not present

## 2019-09-28 DIAGNOSIS — R0602 Shortness of breath: Secondary | ICD-10-CM | POA: Diagnosis not present

## 2019-09-28 DIAGNOSIS — C50811 Malignant neoplasm of overlapping sites of right female breast: Secondary | ICD-10-CM | POA: Diagnosis not present

## 2019-09-28 DIAGNOSIS — J90 Pleural effusion, not elsewhere classified: Secondary | ICD-10-CM | POA: Diagnosis not present

## 2019-09-28 DIAGNOSIS — C7801 Secondary malignant neoplasm of right lung: Secondary | ICD-10-CM | POA: Diagnosis not present

## 2019-09-28 DIAGNOSIS — G893 Neoplasm related pain (acute) (chronic): Secondary | ICD-10-CM | POA: Diagnosis not present

## 2019-09-28 DIAGNOSIS — C7951 Secondary malignant neoplasm of bone: Secondary | ICD-10-CM | POA: Diagnosis not present

## 2019-09-28 DIAGNOSIS — D649 Anemia, unspecified: Secondary | ICD-10-CM | POA: Diagnosis not present

## 2019-09-29 DIAGNOSIS — R0602 Shortness of breath: Secondary | ICD-10-CM | POA: Diagnosis not present

## 2019-09-29 DIAGNOSIS — C50811 Malignant neoplasm of overlapping sites of right female breast: Secondary | ICD-10-CM | POA: Diagnosis not present

## 2019-09-29 DIAGNOSIS — J90 Pleural effusion, not elsewhere classified: Secondary | ICD-10-CM | POA: Diagnosis not present

## 2019-09-29 DIAGNOSIS — C7951 Secondary malignant neoplasm of bone: Secondary | ICD-10-CM | POA: Diagnosis not present

## 2019-09-29 DIAGNOSIS — D649 Anemia, unspecified: Secondary | ICD-10-CM | POA: Diagnosis not present

## 2019-09-29 DIAGNOSIS — C7801 Secondary malignant neoplasm of right lung: Secondary | ICD-10-CM | POA: Diagnosis not present

## 2019-09-29 DIAGNOSIS — G893 Neoplasm related pain (acute) (chronic): Secondary | ICD-10-CM | POA: Diagnosis not present

## 2019-09-30 DIAGNOSIS — G893 Neoplasm related pain (acute) (chronic): Secondary | ICD-10-CM | POA: Diagnosis not present

## 2019-09-30 DIAGNOSIS — D649 Anemia, unspecified: Secondary | ICD-10-CM | POA: Diagnosis not present

## 2019-09-30 DIAGNOSIS — C50811 Malignant neoplasm of overlapping sites of right female breast: Secondary | ICD-10-CM | POA: Diagnosis not present

## 2019-09-30 DIAGNOSIS — C7951 Secondary malignant neoplasm of bone: Secondary | ICD-10-CM | POA: Diagnosis not present

## 2019-09-30 DIAGNOSIS — J90 Pleural effusion, not elsewhere classified: Secondary | ICD-10-CM | POA: Diagnosis not present

## 2019-09-30 DIAGNOSIS — C7801 Secondary malignant neoplasm of right lung: Secondary | ICD-10-CM | POA: Diagnosis not present

## 2019-09-30 DIAGNOSIS — R0602 Shortness of breath: Secondary | ICD-10-CM | POA: Diagnosis not present

## 2019-10-01 DIAGNOSIS — I10 Essential (primary) hypertension: Secondary | ICD-10-CM | POA: Diagnosis not present

## 2019-10-01 DIAGNOSIS — R0602 Shortness of breath: Secondary | ICD-10-CM | POA: Diagnosis not present

## 2019-10-01 DIAGNOSIS — Z170 Estrogen receptor positive status [ER+]: Secondary | ICD-10-CM | POA: Diagnosis not present

## 2019-10-01 DIAGNOSIS — M47816 Spondylosis without myelopathy or radiculopathy, lumbar region: Secondary | ICD-10-CM | POA: Diagnosis not present

## 2019-10-01 DIAGNOSIS — Z9981 Dependence on supplemental oxygen: Secondary | ICD-10-CM | POA: Diagnosis not present

## 2019-10-01 DIAGNOSIS — K5900 Constipation, unspecified: Secondary | ICD-10-CM | POA: Diagnosis not present

## 2019-10-01 DIAGNOSIS — C7801 Secondary malignant neoplasm of right lung: Secondary | ICD-10-CM | POA: Diagnosis not present

## 2019-10-01 DIAGNOSIS — M810 Age-related osteoporosis without current pathological fracture: Secondary | ICD-10-CM | POA: Diagnosis not present

## 2019-10-01 DIAGNOSIS — D630 Anemia in neoplastic disease: Secondary | ICD-10-CM | POA: Diagnosis not present

## 2019-10-01 DIAGNOSIS — G893 Neoplasm related pain (acute) (chronic): Secondary | ICD-10-CM | POA: Diagnosis not present

## 2019-10-01 DIAGNOSIS — J90 Pleural effusion, not elsewhere classified: Secondary | ICD-10-CM | POA: Diagnosis not present

## 2019-10-01 DIAGNOSIS — D649 Anemia, unspecified: Secondary | ICD-10-CM | POA: Diagnosis not present

## 2019-10-01 DIAGNOSIS — J9611 Chronic respiratory failure with hypoxia: Secondary | ICD-10-CM | POA: Diagnosis not present

## 2019-10-01 DIAGNOSIS — Z9181 History of falling: Secondary | ICD-10-CM | POA: Diagnosis not present

## 2019-10-01 DIAGNOSIS — I959 Hypotension, unspecified: Secondary | ICD-10-CM | POA: Diagnosis not present

## 2019-10-01 DIAGNOSIS — C7951 Secondary malignant neoplasm of bone: Secondary | ICD-10-CM | POA: Diagnosis not present

## 2019-10-01 DIAGNOSIS — C50811 Malignant neoplasm of overlapping sites of right female breast: Secondary | ICD-10-CM | POA: Diagnosis not present

## 2019-10-02 DIAGNOSIS — C7801 Secondary malignant neoplasm of right lung: Secondary | ICD-10-CM | POA: Diagnosis not present

## 2019-10-02 DIAGNOSIS — C50811 Malignant neoplasm of overlapping sites of right female breast: Secondary | ICD-10-CM | POA: Diagnosis not present

## 2019-10-02 DIAGNOSIS — G893 Neoplasm related pain (acute) (chronic): Secondary | ICD-10-CM | POA: Diagnosis not present

## 2019-10-02 DIAGNOSIS — C7951 Secondary malignant neoplasm of bone: Secondary | ICD-10-CM | POA: Diagnosis not present

## 2019-10-02 DIAGNOSIS — R0602 Shortness of breath: Secondary | ICD-10-CM | POA: Diagnosis not present

## 2019-10-02 DIAGNOSIS — J90 Pleural effusion, not elsewhere classified: Secondary | ICD-10-CM | POA: Diagnosis not present

## 2019-10-02 DIAGNOSIS — D649 Anemia, unspecified: Secondary | ICD-10-CM | POA: Diagnosis not present

## 2019-10-05 DIAGNOSIS — C7801 Secondary malignant neoplasm of right lung: Secondary | ICD-10-CM | POA: Diagnosis not present

## 2019-10-05 DIAGNOSIS — J90 Pleural effusion, not elsewhere classified: Secondary | ICD-10-CM | POA: Diagnosis not present

## 2019-10-05 DIAGNOSIS — G893 Neoplasm related pain (acute) (chronic): Secondary | ICD-10-CM | POA: Diagnosis not present

## 2019-10-05 DIAGNOSIS — R0602 Shortness of breath: Secondary | ICD-10-CM | POA: Diagnosis not present

## 2019-10-05 DIAGNOSIS — C50811 Malignant neoplasm of overlapping sites of right female breast: Secondary | ICD-10-CM | POA: Diagnosis not present

## 2019-10-05 DIAGNOSIS — D649 Anemia, unspecified: Secondary | ICD-10-CM | POA: Diagnosis not present

## 2019-10-05 DIAGNOSIS — C7951 Secondary malignant neoplasm of bone: Secondary | ICD-10-CM | POA: Diagnosis not present

## 2019-10-05 NOTE — Progress Notes (Signed)
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  Melayah continues on anastrozole.  She tolerates it well.  She has hot flashes may be once or twice a day and then many days she does not have any.  They do not tend to occur at night and they do not wake her up at night.  She is also on palbociclib stably at 100 mg daily 21 days on 7 days off.  She denies unusual fatigue or nausea associated with the medication.  Her counts have been holding steady.  She receives [place] every 4 weeks and her last dose was on 09/08/2019.  She has no bone pain or problems with her teeth or gums related to this.  She completed palliative radiation therapy to her left humerus on 07/31/2019.  She has had physical therapy for the arm and is increasing her ability to use it.  Since her last visit here she underwent left cataract surgery and she is scheduled for right cataract surgery 10/15/2019     REVIEW OF SYSTEMS: Suzanne [name] is not yet back to work chiefly because of eye issues but once that she gets her vision straightened out she is planning to get back on the computer.  At home she walks with a cane.  Most of the time she has good saturations on room air but if she takes a shower for example or walks a little further she desats to 87% or so.  She now has no cough no phlegm production and no pleurisy.  She continues to have a fungal/candidal rash.  She takes Diflucan daily  and uses Nizoral as well but she has not been able to completely clear it.  They are having problems with their current oxygen provider and they are interested in switching to a different company.  In addition she does not have a portable concentrator which would allow her to get outside more easily.  Both Charle and her daughter Suzanne [name] have had the [employer] vaccine which they tolerated well   HISTORY OF CURRENT ILLNESS: From the original inpatient consult note:  Ms. Cohoon is a 59 year old female from [place], [place] without significant past medical history with exception of obesity, remote history of gallstones, and remote history of mastitis.  The patient presented to the hospital on 10/26/2018 with a 3-week history of worsening shortness of breath and lower extremity edema.  The patient reported that her shortness of breath worsened with exertion.  Her lower extremity edema did not improve with elevation.  She also had an intermittent pr oductive cough with rare sputum production.  The patient was noted to be anemic with a hemoglobin of 4.5 with a clumped platelets noted on admission.  Stool for occult blood was positive.  Patient received 2 units of packed red blood cells.    The patient reported a rash under her breast x1 week which has been foul-smelling.  She also reported that she has some discomfort and thickening/hardness of her right breast for several months.  She states that her  right breast is much larger than her left.  She notes some occasional bloody discharge from her breast once in a while.  A chest x-ray on admission showed diffuse bilateral interstitial opacity of unknown chronicity, findings could be related to interstitial inflammatory process, atypical/viral pneumonia, or possible metastatic disease.  There are also sclerotic and lytic lesions within the clavicles, bilateral ribs, shoulders which was concerning for diffuse few skeletal metastatic disease.    This prompted  a CT scan of the chest with contrast which showed bilateral axillary and mediastinal adenopathy, borderline hilar lymph nodes bilaterally, findings likely flecked metastatic lymphadenopathy, moderate bilateral pleural effusions, extensive interstitial thickening and reticulonodular opacities throughout the lungs, cannot exclude lymphangitic spread of tumor, diffuse skeletal metastases.  The patient's subsequent history is as detailed below.   PAST MEDICAL HISTORY: Past Medical History:  Diagnosis Date  . Cancer ([place])   . Family history of breast cancer   . Family history of colon cancer   . Family history of melanoma   . Family history of prostate cancer   . Gallstones   . Glaucoma   . Mastitis    reports history of recurrent mastitis  . Metastatic breast cancer ([place])   . Obesity     PAST SURGICAL HISTORY: Past Surgical History:  Procedure Laterality Date  . GLAUCOMA SURGERY  late 1990's  . IR THORACENTESIS ASP PLEURAL SPACE W/IMG GUIDE  10/28/2018  . IR THORACENTESIS ASP PLEURAL SPACE W/IMG GUIDE  10/29/2018  . OPEN REDUCTION INTERNAL FIXATION (ORIF) DISTAL RADIAL FRACTURE Left 01/09/2019   Procedure: OPEN REDUCTION INTERNAL FIXATION (ORIF) HUMERAL FRACTURE;  Surgeon: Netta Cedars, MD;  Location: [facility];  Service: Orthopedics;  Laterality: Left;    FAMILY HISTORY: Family History  Problem Relation Age of Onset  . Breast cancer Sister        in her 34's-70s  . Heart disease Brother        CABG  . Heart disease Brother        CABG  . Skin cancer Brother        melanoma  . Colon cancer Cousin   . Heart attack Mother   . Heart attack Father   . Prostate cancer Brother   Patient's father passed away at age 79, and her mother at 75, both from heart attacks. The patient has 8 siblings, 6 brothers and 1 sister.  Her sister was diagnosed with breast cancer. One brother has prostate cancer, another brother had a melanoma removed.    GYNECOLOGIC HISTORY:  Menarche: 59 years  old Age at first live birth: 59 years old [place] P 1 LMP age 65 Contraceptive: used for 2-3 years with no problems HRT never used  Hysterectomy? no   SOCIAL HISTORY:  Artina worked for an Engineer, manufacturing systems. She is widowed. She lives at home with her daughter.  Suzanne [name], 37, who is a Psychologist, occupational at [employer] center.  The patient has no grandchildren. She is not a Ambulance person.   ADVANCED DIRECTIVES: not on file; she has the paperwork already. She intends to name her daughter as her HCPOA.   HEALTH MAINTENANCE: Social History   Tobacco Use  . Smoking status: Never Smoker  . Smokeless tobacco: Never Used  . Tobacco comment: second hand smoke exposure  Substance Use Topics  . Alcohol use: No  . Drug use: No    Colonoscopy:   PAP:   Bone density:  Mammography:   No Known Allergies  Current Outpatient Medications  Medication Sig Dispense Refill  . albuterol (VENTOLIN HFA) 108 (90 Base) MCG/ACT inhaler Inhale 2 puffs into the lungs every 6 (six) hours as needed for wheezing or shortness of breath. 16 g 3  . ALPRAZolam (XANAX) 0.25 MG tablet Take 1-2 tablets (0.25-0.5 mg total) by mouth every 8 (eight) hours as needed for anxiety. Prescription needed for SNF placement 5 tablet 0  . anastrozole (ARIMIDEX) 1 MG tablet Take 1 tablet (1 mg total) by mouth daily. 30 tablet 3  . antiseptic oral rinse (BIOTENE) LIQD 15 mLs by Mouth Rinse route as needed for dry mouth.    . benzonatate (TESSALON) 200 MG capsule TAKE 1 CAPSULE BY MOUTH 3 TIMES DAILY IF NEEDED 30 capsule 0  . Blood Pressure Monitor MISC Use to check blood pressure twice per day and more often if not feeling well 1 each 0  . calcium-vitamin D (OSCAL WITH D) 500-200 MG-UNIT tablet Take 1 tablet by mouth 3 (three) times daily. 90 tablet 1  . carvedilol (COREG) 3.125 MG tablet Take 1 tablet (3.125 mg total) by mouth 2 (two) times daily with a meal. 60 tablet 0  . fluconazole (DIFLUCAN) 100 MG tablet Take 1 tablet (100 mg  total) by mouth daily. Take medication for 10 days and then take only as needed. 90 tablet 3  . furosemide (LASIX) 20 MG tablet Take 1 tablet (20 mg total) by mouth daily. 90 tablet 4  . ketoconazole (NIZORAL) 2 % cream APPLY EXTERNALLY TO THE AFFECTED AREA DAILY 30 g 1  . loratadine (CLARITIN) 10 MG tablet Take 1 tablet (10 mg total) by mouth daily. 30 tablet 3  . nystatin (MYCOSTATIN/NYSTOP) powder Apply topically 3 (three) times daily. Apply under breast area (Patient not taking: Reported on 09/18/2019) 15 g 1  . palbociclib (IBRANCE) 100 MG capsule Take 1 capsule (100 mg total) by mouth daily with breakfast. Take whole with food. Take for 21 days on, 7 days off, repeat every 28 days. 21 capsule 6  . polyethylene glycol (MIRALAX / GLYCOLAX) 17 g packet Take 17 g by mouth as needed for constipation.    . senna (SENOKOT) 8.6 MG TABS tablet Take 1 tablet (8.6 mg total) by mouth 2 (two) times daily. (Patient not taking: Reported on 09/18/2019) 120 tablet 0  . sertraline (ZOLOFT) 50 MG tablet Two pills once per day at bedtime 60 tablet 4  . ZOFRAN 8 MG tablet Take 8 mg by mouth every 8 (eight) hours.     No current facility-administered medications for this visit.   Facility-Administered Medications Ordered in Other Visits  Medication Dose Route Frequency Provider Last Rate Last Admin  . denosumab (XGEVA) injection 120 mg  120 mg Subcutaneous Once Yisroel Mullendore, Virgie Dad, MD        OBJECTIVE: white woman examined in a wheelchair  Vitals:   10/06/19 1457  BP: (!) 130/98  Pulse: 87  Resp: 18  Temp: 98.3 F (36.8 C)  SpO2: 100%     Body mass index is 36.84 kg/m.   Wt Readings from Last 3 Encounters:  10/06/19 182 lb 6.4 oz (82.7 kg)  07/14/19 173 lb 4.8 oz (78.6 kg)  02/06/19 167 lb 1.7 oz (75.8 kg)  ECOG FS:3  Sclerae unicteric, EOMs intact Wearing a mask No cervical or supraclavicular adenopathy Lungs no rales or rhonchi Heart regular rate and rhythm Abd soft, obese, nontender,  positive bowel sounds MSK significant scoliosis and kyphosis but no focal spinal tenderness, no upper extremity lymphedema Neuro:  nonfocal, well oriented, appropriate affect Breasts: There is no erythema or easily palpable mass in the right breast but the lower portion of the breast is generally indurated.  There is no nipple retraction.  The right axilla is benign.  The left breast and left axilla are unremarkable Skin: Fungal rash in the back and also in the right inframammary fold  LAB RESULTS:  CMP     Component Value Date/Time   NA 141 10/06/2019 1432   K 4.5 10/06/2019 1432   CL 104 10/06/2019 1432   CO2 28 10/06/2019 1432   GLUCOSE 99 10/06/2019 1432   BUN 25 (H) 10/06/2019 1432   CREATININE 1.09 (H) 10/06/2019 1432   CREATININE 0.78 07/24/2019 1453   CALCIUM 9.2 10/06/2019 1432   PROT 7.2 10/06/2019 1432   ALBUMIN 3.5 10/06/2019 1432   AST 22 10/06/2019 1432   AST 14 (L) 07/24/2019 1453   ALT 18 10/06/2019 1432   ALT 8 07/24/2019 1453   ALKPHOS 120 10/06/2019 1432   BILITOT 0.4 10/06/2019 1432   BILITOT 0.3 07/24/2019 1453   GFRNONAA 55 (L) 10/06/2019 1432   GFRNONAA >60 07/24/2019 1453   GFRAA >60 10/06/2019 1432   GFRAA >60 07/24/2019 1453    No results found for: TOTALPROTELP, ALBUMINELP, A1GS, A2GS, BETS, BETA2SER, GAMS, MSPIKE, SPEI  No results found for: KPAFRELGTCHN, LAMBDASER, KAPLAMBRATIO  Lab Results  Component Value Date   WBC 2.9 (L) 10/06/2019   NEUTROABS 1.5 (L) 10/06/2019   HGB 10.0 (L) 10/06/2019   HCT 31.6 (L) 10/06/2019   MCV 114.9 (H) 10/06/2019   PLT 328 10/06/2019   No results found for: LABCA2  No components found for: [id number]  No results for input(s): INR in the last 168 hours.  No results found for: LABCA2  No results found for: WUJ811  No results found for: BJY782  No results found for: NFA213  Lab Results  Component Value Date   CA2729 41.4 (H) 09/08/2019    No components found for: HGQUANT  No results found for:  CEA1 / No results found for: CEA1   No results found for: AFPTUMOR  No results found for: CHROMOGRNA  No results found for: HGBA, HGBA2QUANT, HGBFQUANT, HGBSQUAN (Hemoglobinopathy evaluation)   Lab Results  Component Value Date   LDH 244 (H) 12/25/2018    Lab Results  Component Value Date   IRON 137 10/26/2018   TIBC 384 10/26/2018   IRONPCTSAT 36 (H) 10/26/2018   (Iron and TIBC)  Lab Results  Component Value Date   FERRITIN 2,306 (H) 12/25/2018    Urinalysis    Component Value Date/Time   COLORURINE STRAW (A) 01/15/2019 1150   APPEARANCEUR CLEAR 01/15/2019 1150   LABSPEC 1.008 01/15/2019 1150   PHURINE 6.0 01/15/2019 1150   GLUCOSEU NEGATIVE 01/15/2019 [place] 01/15/2019 [place] 01/15/2019 1150   KETONESUR NEGATIVE 01/15/2019 1150   PROTEINUR NEGATIVE 01/15/2019 1150   NITRITE NEGATIVE 01/15/2019 1150   LEUKOCYTESUR TRACE (A) 01/15/2019 1150    STUDIES:  No results found.   ELIGIBLE FOR AVAILABLE RESEARCH PROTOCOL: no   ASSESSMENT: 59 y.o. [place], [place] woman presenting 10/26/2018 with right-sided inflammatory breast cancer, stage IV, involvnh lungs, lymph nodes and bones, as follows:  (a) chest CT scan 10/26/2018 shows bilateral pleural effusions, possible lymphangitic spread of tumor, diffuse bony metastatic disease, and significant axillary mediastinal and hilar adenopathy  (b) bone scan 10/27/2018 is a "near SuperScan" consistent with widespread bony metastatic disease  (  c) head CT with and without contrast 10/30/2018 shows no intracranial metastatic disease, multiple calvarial lesions  (d) CA-27-29 on 10/27/2018 was 1810.4  (1) pleural fluid from right thoracentesis 10/28/2018 confirms malignant cells consistent with a breast primary, strongly estrogen and progesterone receptor positive, HER-2 not amplified, with an MIB-1 of 2%  (2) anastrozole started 10/29/2018  (a) palbociclib to start 11/13/2018 at 100  mg dose  (b) denosumab/xgeva started 11/13/2018   (3) associated problems:  (a) hypoxia secondary to effusions  (b) pain from bone lesions and left humeral and vertebral compression fractures  (c) right upper extremity lymphedema  (d) anemia, requiring transfusion  (e) poor venous access  (4) genetics testing on 12/25/2018 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 STK11, TP53, TSC1, TSC2, and VHL.  The following genes were evaluated for sequence changes only: SDHA and HOXB13 c.251G>A variant only. The [facility] Melanoma Panel analyzed the following 9 genes: BAP1 BRCA2 CDK4 CDKN2A MITF POT1 PTEN RB1 Tp53.   (5) palliative radiation to the left humerus 07/20/2019 - 07/31/2019 Site Technique Total Dose (Gy) Dose per Fx (Gy) Completed Fx Beam Energies  Humerus, Left: Ext_Lt_humerus Complex 30/30 3 10/10 6X    PLAN: Kamyia is now a year out from definitive diagnosis of her metastatic/inflammatory breast cancer.  Today she looks the best that I have seen her.  She is very alert, able to make decisions, moves more easily, and is very forward looking.  This is very gratifying.  She is tolerating the anastrozole and palbociclib combination without any unusual side effects.  She also appears to be benefiting from the denosumab/Xgeva she receives monthly.  The plan is to continue the current treatment.  She will receive Xgeva every 4 weeks and we will continue to check lab work with each dose.  She  will see me again in 3 months and before that visit she will have a repeat CT of the chest.  They have had difficulties with her oxygen provider and they would like to switch to a different company.  I wrote her a prescription which would allow them to do that.  Also Kimla does not have a Marine scientist.  This is something that would allow her to get out of the house more.  I wrote her a prescription for that as well.  They know to call for any other issue that may develop before the next visit  Total encounter time 35 minutes.Sarajane Jews C. Caydee Talkington, MD Medical Oncology and Hematology [facility] [place], [place] [address] Tel. [phone]    Fax. [phone]   I, Wilburn Mylar, am acting as scribe for Dr. Virgie Dad. Makye Radle.  I, Lurline Del MD, have reviewed the above documentation for accuracy and completeness, and I agree with the above.   *Total Encounter Time as defined by the Centers for Medicare and Medicaid Services includes, in addition to the face-to-face time of a patient visit (documented in the note above) non-face-to-face time: obtaining and reviewing outside history, ordering and reviewing medications, tests or procedures, care coordination (communications  with other health care professionals or caregivers) and documentation in the medical record.

## 2019-10-06 ENCOUNTER — Inpatient Hospital Stay (HOSPITAL_BASED_OUTPATIENT_CLINIC_OR_DEPARTMENT_OTHER): Payer: Medicaid Other | Admitting: Oncology

## 2019-10-06 ENCOUNTER — Other Ambulatory Visit: Payer: Self-pay

## 2019-10-06 ENCOUNTER — Inpatient Hospital Stay: Payer: Medicaid Other | Attending: Adult Health

## 2019-10-06 ENCOUNTER — Inpatient Hospital Stay: Payer: Medicaid Other

## 2019-10-06 VITALS — BP 130/98 | HR 87 | Temp 98.3°F | Resp 18 | Ht 59.0 in | Wt 182.4 lb

## 2019-10-06 DIAGNOSIS — G893 Neoplasm related pain (acute) (chronic): Secondary | ICD-10-CM

## 2019-10-06 DIAGNOSIS — C7801 Secondary malignant neoplasm of right lung: Secondary | ICD-10-CM | POA: Diagnosis not present

## 2019-10-06 DIAGNOSIS — C779 Secondary and unspecified malignant neoplasm of lymph node, unspecified: Secondary | ICD-10-CM | POA: Diagnosis not present

## 2019-10-06 DIAGNOSIS — C50811 Malignant neoplasm of overlapping sites of right female breast: Secondary | ICD-10-CM

## 2019-10-06 DIAGNOSIS — Z170 Estrogen receptor positive status [ER+]: Secondary | ICD-10-CM | POA: Diagnosis not present

## 2019-10-06 DIAGNOSIS — D649 Anemia, unspecified: Secondary | ICD-10-CM

## 2019-10-06 DIAGNOSIS — C50911 Malignant neoplasm of unspecified site of right female breast: Secondary | ICD-10-CM

## 2019-10-06 DIAGNOSIS — C7951 Secondary malignant neoplasm of bone: Secondary | ICD-10-CM

## 2019-10-06 DIAGNOSIS — C7800 Secondary malignant neoplasm of unspecified lung: Secondary | ICD-10-CM | POA: Diagnosis not present

## 2019-10-06 DIAGNOSIS — R0602 Shortness of breath: Secondary | ICD-10-CM

## 2019-10-06 DIAGNOSIS — J90 Pleural effusion, not elsewhere classified: Secondary | ICD-10-CM

## 2019-10-06 LAB — COMPREHENSIVE METABOLIC PANEL
ALT: 18 U/L (ref 0–44)
AST: 22 U/L (ref 15–41)
Albumin: 3.5 g/dL (ref 3.5–5.0)
Alkaline Phosphatase: 120 U/L (ref 38–126)
Anion gap: 9 (ref 5–15)
BUN: 25 mg/dL — ABNORMAL HIGH (ref 6–20)
CO2: 28 mmol/L (ref 22–32)
Calcium: 9.2 mg/dL (ref 8.9–10.3)
Chloride: 104 mmol/L (ref 98–111)
Creatinine, Ser: 1.09 mg/dL — ABNORMAL HIGH (ref 0.44–1.00)
GFR calc Af Amer: >60 mL/min (ref >60)
GFR calc non Af Amer: 55 mL/min — ABNORMAL LOW (ref >60)
Glucose, Bld: 99 mg/dL (ref 70–99)
Potassium: 4.5 mmol/L (ref 3.5–5.1)
Sodium: 141 mmol/L (ref 135–145)
Total Bilirubin: 0.4 mg/dL (ref 0.3–1.2)
Total Protein: 7.2 g/dL (ref 6.5–8.1)

## 2019-10-06 LAB — CBC WITH DIFFERENTIAL/PLATELET
Abs Immature Granulocytes: 0.01 10*3/uL (ref 0.00–0.07)
Basophils Absolute: 0.1 10*3/uL (ref 0.0–0.1)
Basophils Relative: 3 %
Eosinophils Absolute: 0.1 10*3/uL (ref 0.0–0.5)
Eosinophils Relative: 2 %
HCT: 31.6 % — ABNORMAL LOW (ref 36.0–46.0)
Hemoglobin: 10 g/dL — ABNORMAL LOW (ref 12.0–15.0)
Immature Granulocytes: 0 %
Lymphocytes Relative: 37 %
Lymphs Abs: 1.1 10*3/uL (ref 0.7–4.0)
MCH: 36.4 pg — ABNORMAL HIGH (ref 26.0–34.0)
MCHC: 31.6 g/dL (ref 30.0–36.0)
MCV: 114.9 fL — ABNORMAL HIGH (ref 80.0–100.0)
Monocytes Absolute: 0.2 10*3/uL (ref 0.1–1.0)
Monocytes Relative: 6 %
Neutro Abs: 1.5 10*3/uL — ABNORMAL LOW (ref 1.7–7.7)
Neutrophils Relative %: 52 %
Platelets: 328 10*3/uL (ref 150–400)
RBC: 2.75 MIL/uL — ABNORMAL LOW (ref 3.87–5.11)
RDW: 14.9 % (ref 11.5–15.5)
WBC: 2.9 10*3/uL — ABNORMAL LOW (ref 4.0–10.5)
nRBC: 0 % (ref 0.0–0.2)

## 2019-10-06 MED ORDER — DENOSUMAB 120 MG/1.7ML ~~LOC~~ SOLN
120.0000 mg | Freq: Once | SUBCUTANEOUS | Status: AC
  Administered 2019-10-06: 120 mg via SUBCUTANEOUS

## 2019-10-06 MED ORDER — DENOSUMAB 120 MG/1.7ML ~~LOC~~ SOLN
SUBCUTANEOUS | Status: AC
  Filled 2019-10-06: qty 1.7

## 2019-10-06 NOTE — Patient Instructions (Signed)
Denosumab injection What is this medicine? DENOSUMAB (den oh sue mab) slows bone breakdown. Prolia is used to treat osteoporosis in women after menopause and in men, and in people who are taking corticosteroids for 6 months or more. Xgeva is used to treat a high calcium level due to cancer and to prevent bone fractures and other bone problems caused by multiple myeloma or cancer bone metastases. Xgeva is also used to treat giant cell tumor of the bone. This medicine may be used for other purposes; ask your health care provider or pharmacist if you have questions. COMMON BRAND NAME(S): Prolia, XGEVA What should I tell my health care provider before I take this medicine? They need to know if you have any of these conditions:  dental disease  having surgery or tooth extraction  infection  kidney disease  low levels of calcium or Vitamin D in the blood  malnutrition  on hemodialysis  skin conditions or sensitivity  thyroid or parathyroid disease  an unusual reaction to denosumab, other medicines, foods, dyes, or preservatives  pregnant or trying to get pregnant  breast-feeding How should I use this medicine? This medicine is for injection under the skin. It is given by a health care professional in a hospital or clinic setting. A special MedGuide will be given to you before each treatment. Be sure to read this information carefully each time. For Prolia, talk to your pediatrician regarding the use of this medicine in children. Special care may be needed. For Xgeva, talk to your pediatrician regarding the use of this medicine in children. While this drug may be prescribed for children as young as 13 years for selected conditions, precautions do apply. Overdosage: If you think you have taken too much of this medicine contact a poison control center or emergency room at once. NOTE: This medicine is only for you. Do not share this medicine with others. What if I miss a dose? It is  important not to miss your dose. Call your doctor or health care professional if you are unable to keep an appointment. What may interact with this medicine? Do not take this medicine with any of the following medications:  other medicines containing denosumab This medicine may also interact with the following medications:  medicines that lower your chance of fighting infection  steroid medicines like prednisone or cortisone This list may not describe all possible interactions. Give your health care provider a list of all the medicines, herbs, non-prescription drugs, or dietary supplements you use. Also tell them if you smoke, drink alcohol, or use illegal drugs. Some items may interact with your medicine. What should I watch for while using this medicine? Visit your doctor or health care professional for regular checks on your progress. Your doctor or health care professional may order blood tests and other tests to see how you are doing. Call your doctor or health care professional for advice if you get a fever, chills or sore throat, or other symptoms of a cold or flu. Do not treat yourself. This drug may decrease your body's ability to fight infection. Try to avoid being around people who are sick. You should make sure you get enough calcium and vitamin D while you are taking this medicine, unless your doctor tells you not to. Discuss the foods you eat and the vitamins you take with your health care professional. See your dentist regularly. Brush and floss your teeth as directed. Before you have any dental work done, tell your dentist you are   receiving this medicine. Do not become pregnant while taking this medicine or for 5 months after stopping it. Talk with your doctor or health care professional about your birth control options while taking this medicine. Women should inform their doctor if they wish to become pregnant or think they might be pregnant. There is a potential for serious side  effects to an unborn child. Talk to your health care professional or pharmacist for more information. What side effects may I notice from receiving this medicine? Side effects that you should report to your doctor or health care professional as soon as possible:  allergic reactions like skin rash, itching or hives, swelling of the face, lips, or tongue  bone pain  breathing problems  dizziness  jaw pain, especially after dental work  redness, blistering, peeling of the skin  signs and symptoms of infection like fever or chills; cough; sore throat; pain or trouble passing urine  signs of low calcium like fast heartbeat, muscle cramps or muscle pain; pain, tingling, numbness in the hands or feet; seizures  unusual bleeding or bruising  unusually weak or tired Side effects that usually do not require medical attention (report to your doctor or health care professional if they continue or are bothersome):  constipation  diarrhea  headache  joint pain  loss of appetite  muscle pain  runny nose  tiredness  upset stomach This list may not describe all possible side effects. Call your doctor for medical advice about side effects. You may report side effects to FDA at 1-800-FDA-1088. Where should I keep my medicine? This medicine is only given in a clinic, doctor's office, or other health care setting and will not be stored at home. NOTE: This sheet is a summary. It may not cover all possible information. If you have questions about this medicine, talk to your doctor, pharmacist, or health care provider.  2020 Elsevier/Gold Standard (2017-09-20 16:10:44)

## 2019-10-07 ENCOUNTER — Telehealth: Payer: Self-pay | Admitting: Oncology

## 2019-10-07 DIAGNOSIS — C7951 Secondary malignant neoplasm of bone: Secondary | ICD-10-CM | POA: Diagnosis not present

## 2019-10-07 DIAGNOSIS — C50811 Malignant neoplasm of overlapping sites of right female breast: Secondary | ICD-10-CM | POA: Diagnosis not present

## 2019-10-07 DIAGNOSIS — R0602 Shortness of breath: Secondary | ICD-10-CM | POA: Diagnosis not present

## 2019-10-07 DIAGNOSIS — G893 Neoplasm related pain (acute) (chronic): Secondary | ICD-10-CM | POA: Diagnosis not present

## 2019-10-07 DIAGNOSIS — D649 Anemia, unspecified: Secondary | ICD-10-CM | POA: Diagnosis not present

## 2019-10-07 DIAGNOSIS — C7801 Secondary malignant neoplasm of right lung: Secondary | ICD-10-CM | POA: Diagnosis not present

## 2019-10-07 DIAGNOSIS — J90 Pleural effusion, not elsewhere classified: Secondary | ICD-10-CM | POA: Diagnosis not present

## 2019-10-07 LAB — CANCER ANTIGEN 27.29: CA 27.29: 41.9 U/mL — ABNORMAL HIGH (ref 0.0–38.6)

## 2019-10-07 NOTE — Telephone Encounter (Signed)
Scheduled appt per 5/11 los. Pt confirmed appt dates and times.

## 2019-10-08 DIAGNOSIS — D630 Anemia in neoplastic disease: Secondary | ICD-10-CM | POA: Diagnosis not present

## 2019-10-08 DIAGNOSIS — J90 Pleural effusion, not elsewhere classified: Secondary | ICD-10-CM | POA: Diagnosis not present

## 2019-10-08 DIAGNOSIS — C50811 Malignant neoplasm of overlapping sites of right female breast: Secondary | ICD-10-CM | POA: Diagnosis not present

## 2019-10-08 DIAGNOSIS — K5900 Constipation, unspecified: Secondary | ICD-10-CM | POA: Diagnosis not present

## 2019-10-08 DIAGNOSIS — G893 Neoplasm related pain (acute) (chronic): Secondary | ICD-10-CM | POA: Diagnosis not present

## 2019-10-08 DIAGNOSIS — D649 Anemia, unspecified: Secondary | ICD-10-CM | POA: Diagnosis not present

## 2019-10-08 DIAGNOSIS — M47816 Spondylosis without myelopathy or radiculopathy, lumbar region: Secondary | ICD-10-CM | POA: Diagnosis not present

## 2019-10-08 DIAGNOSIS — M810 Age-related osteoporosis without current pathological fracture: Secondary | ICD-10-CM | POA: Diagnosis not present

## 2019-10-08 DIAGNOSIS — R0602 Shortness of breath: Secondary | ICD-10-CM | POA: Diagnosis not present

## 2019-10-08 DIAGNOSIS — Z9981 Dependence on supplemental oxygen: Secondary | ICD-10-CM | POA: Diagnosis not present

## 2019-10-08 DIAGNOSIS — I959 Hypotension, unspecified: Secondary | ICD-10-CM | POA: Diagnosis not present

## 2019-10-08 DIAGNOSIS — C7801 Secondary malignant neoplasm of right lung: Secondary | ICD-10-CM | POA: Diagnosis not present

## 2019-10-08 DIAGNOSIS — C7951 Secondary malignant neoplasm of bone: Secondary | ICD-10-CM | POA: Diagnosis not present

## 2019-10-08 DIAGNOSIS — Z170 Estrogen receptor positive status [ER+]: Secondary | ICD-10-CM | POA: Diagnosis not present

## 2019-10-08 DIAGNOSIS — J9611 Chronic respiratory failure with hypoxia: Secondary | ICD-10-CM | POA: Diagnosis not present

## 2019-10-08 DIAGNOSIS — Z9181 History of falling: Secondary | ICD-10-CM | POA: Diagnosis not present

## 2019-10-08 DIAGNOSIS — I10 Essential (primary) hypertension: Secondary | ICD-10-CM | POA: Diagnosis not present

## 2019-10-09 DIAGNOSIS — D649 Anemia, unspecified: Secondary | ICD-10-CM | POA: Diagnosis not present

## 2019-10-09 DIAGNOSIS — R0602 Shortness of breath: Secondary | ICD-10-CM | POA: Diagnosis not present

## 2019-10-09 DIAGNOSIS — C50811 Malignant neoplasm of overlapping sites of right female breast: Secondary | ICD-10-CM | POA: Diagnosis not present

## 2019-10-09 DIAGNOSIS — C7951 Secondary malignant neoplasm of bone: Secondary | ICD-10-CM | POA: Diagnosis not present

## 2019-10-09 DIAGNOSIS — G893 Neoplasm related pain (acute) (chronic): Secondary | ICD-10-CM | POA: Diagnosis not present

## 2019-10-09 DIAGNOSIS — J90 Pleural effusion, not elsewhere classified: Secondary | ICD-10-CM | POA: Diagnosis not present

## 2019-10-09 DIAGNOSIS — C7801 Secondary malignant neoplasm of right lung: Secondary | ICD-10-CM | POA: Diagnosis not present

## 2019-10-12 ENCOUNTER — Telehealth: Payer: Self-pay | Admitting: *Deleted

## 2019-10-12 DIAGNOSIS — H2511 Age-related nuclear cataract, right eye: Secondary | ICD-10-CM | POA: Diagnosis not present

## 2019-10-12 DIAGNOSIS — J90 Pleural effusion, not elsewhere classified: Secondary | ICD-10-CM | POA: Diagnosis not present

## 2019-10-12 DIAGNOSIS — C7951 Secondary malignant neoplasm of bone: Secondary | ICD-10-CM | POA: Diagnosis not present

## 2019-10-12 DIAGNOSIS — D649 Anemia, unspecified: Secondary | ICD-10-CM | POA: Diagnosis not present

## 2019-10-12 DIAGNOSIS — C50811 Malignant neoplasm of overlapping sites of right female breast: Secondary | ICD-10-CM | POA: Diagnosis not present

## 2019-10-12 DIAGNOSIS — G893 Neoplasm related pain (acute) (chronic): Secondary | ICD-10-CM | POA: Diagnosis not present

## 2019-10-12 DIAGNOSIS — C7801 Secondary malignant neoplasm of right lung: Secondary | ICD-10-CM | POA: Diagnosis not present

## 2019-10-12 DIAGNOSIS — R0602 Shortness of breath: Secondary | ICD-10-CM | POA: Diagnosis not present

## 2019-10-12 NOTE — Telephone Encounter (Signed)
This RN spoke with Berline Chough regarding call from Friday and inquiry if she needed to continue the oral antifungal.  This RN informed her per MD review- she should continue medication.  She also asked about " when when I to be concerned if I have fluid in lungs-?" she states yesterday she had a coughing bout that brought up a little phlegm.  She states after episode she has not had further issues.  This RN discussed above likely enviromentally caused such as pollen or such. If she was to develop an infection she would likely have ongoing cough with abnormal colored phlegm and usually a fever.  If she is concerned about the cancer getting worse- again her cough would not be relieved but worsen and she likely would have increased shortness of breath.  Safina then stated she is having short term memory loss ( which she was qued by her daughter to state ).  She states she just doesn't remember certain things she does during the day.  This RN discussed above including " chemo brain " .  Discussed concern arises when accompanied by other behaviors including loss of skill in an ADL or word loss and sensorium changes.  Antoinette again stated she is not having any of these issues - just general loss of some of her activities of her day.  She states safety as her daughter is constantly with her " watching me like a hawk "  Per end of call - no further concerns.

## 2019-10-13 DIAGNOSIS — C7801 Secondary malignant neoplasm of right lung: Secondary | ICD-10-CM | POA: Diagnosis not present

## 2019-10-13 DIAGNOSIS — D649 Anemia, unspecified: Secondary | ICD-10-CM | POA: Diagnosis not present

## 2019-10-13 DIAGNOSIS — R0602 Shortness of breath: Secondary | ICD-10-CM | POA: Diagnosis not present

## 2019-10-13 DIAGNOSIS — C7951 Secondary malignant neoplasm of bone: Secondary | ICD-10-CM | POA: Diagnosis not present

## 2019-10-13 DIAGNOSIS — J90 Pleural effusion, not elsewhere classified: Secondary | ICD-10-CM | POA: Diagnosis not present

## 2019-10-13 DIAGNOSIS — G893 Neoplasm related pain (acute) (chronic): Secondary | ICD-10-CM | POA: Diagnosis not present

## 2019-10-13 DIAGNOSIS — C50811 Malignant neoplasm of overlapping sites of right female breast: Secondary | ICD-10-CM | POA: Diagnosis not present

## 2019-10-14 DIAGNOSIS — C7951 Secondary malignant neoplasm of bone: Secondary | ICD-10-CM | POA: Diagnosis not present

## 2019-10-14 DIAGNOSIS — C50811 Malignant neoplasm of overlapping sites of right female breast: Secondary | ICD-10-CM | POA: Diagnosis not present

## 2019-10-14 DIAGNOSIS — G893 Neoplasm related pain (acute) (chronic): Secondary | ICD-10-CM | POA: Diagnosis not present

## 2019-10-14 DIAGNOSIS — R0602 Shortness of breath: Secondary | ICD-10-CM | POA: Diagnosis not present

## 2019-10-14 DIAGNOSIS — D649 Anemia, unspecified: Secondary | ICD-10-CM | POA: Diagnosis not present

## 2019-10-14 DIAGNOSIS — C7801 Secondary malignant neoplasm of right lung: Secondary | ICD-10-CM | POA: Diagnosis not present

## 2019-10-14 DIAGNOSIS — J90 Pleural effusion, not elsewhere classified: Secondary | ICD-10-CM | POA: Diagnosis not present

## 2019-10-15 DIAGNOSIS — H2511 Age-related nuclear cataract, right eye: Secondary | ICD-10-CM | POA: Diagnosis not present

## 2019-10-16 DIAGNOSIS — G893 Neoplasm related pain (acute) (chronic): Secondary | ICD-10-CM | POA: Diagnosis not present

## 2019-10-16 DIAGNOSIS — C50811 Malignant neoplasm of overlapping sites of right female breast: Secondary | ICD-10-CM | POA: Diagnosis not present

## 2019-10-16 DIAGNOSIS — R0602 Shortness of breath: Secondary | ICD-10-CM | POA: Diagnosis not present

## 2019-10-16 DIAGNOSIS — J90 Pleural effusion, not elsewhere classified: Secondary | ICD-10-CM | POA: Diagnosis not present

## 2019-10-16 DIAGNOSIS — C7801 Secondary malignant neoplasm of right lung: Secondary | ICD-10-CM | POA: Diagnosis not present

## 2019-10-16 DIAGNOSIS — D649 Anemia, unspecified: Secondary | ICD-10-CM | POA: Diagnosis not present

## 2019-10-16 DIAGNOSIS — C7951 Secondary malignant neoplasm of bone: Secondary | ICD-10-CM | POA: Diagnosis not present

## 2019-10-19 DIAGNOSIS — C50811 Malignant neoplasm of overlapping sites of right female breast: Secondary | ICD-10-CM | POA: Diagnosis not present

## 2019-10-19 DIAGNOSIS — J90 Pleural effusion, not elsewhere classified: Secondary | ICD-10-CM | POA: Diagnosis not present

## 2019-10-19 DIAGNOSIS — R0602 Shortness of breath: Secondary | ICD-10-CM | POA: Diagnosis not present

## 2019-10-19 DIAGNOSIS — C7801 Secondary malignant neoplasm of right lung: Secondary | ICD-10-CM | POA: Diagnosis not present

## 2019-10-19 DIAGNOSIS — D649 Anemia, unspecified: Secondary | ICD-10-CM | POA: Diagnosis not present

## 2019-10-19 DIAGNOSIS — C7951 Secondary malignant neoplasm of bone: Secondary | ICD-10-CM | POA: Diagnosis not present

## 2019-10-19 DIAGNOSIS — G893 Neoplasm related pain (acute) (chronic): Secondary | ICD-10-CM | POA: Diagnosis not present

## 2019-10-20 ENCOUNTER — Other Ambulatory Visit: Payer: Self-pay | Admitting: Adult Health

## 2019-10-20 ENCOUNTER — Telehealth: Payer: Self-pay | Admitting: *Deleted

## 2019-10-20 DIAGNOSIS — C7951 Secondary malignant neoplasm of bone: Secondary | ICD-10-CM | POA: Diagnosis not present

## 2019-10-20 DIAGNOSIS — C50811 Malignant neoplasm of overlapping sites of right female breast: Secondary | ICD-10-CM | POA: Diagnosis not present

## 2019-10-20 DIAGNOSIS — D649 Anemia, unspecified: Secondary | ICD-10-CM | POA: Diagnosis not present

## 2019-10-20 DIAGNOSIS — G893 Neoplasm related pain (acute) (chronic): Secondary | ICD-10-CM | POA: Diagnosis not present

## 2019-10-20 DIAGNOSIS — R0602 Shortness of breath: Secondary | ICD-10-CM | POA: Diagnosis not present

## 2019-10-20 DIAGNOSIS — J90 Pleural effusion, not elsewhere classified: Secondary | ICD-10-CM | POA: Diagnosis not present

## 2019-10-20 DIAGNOSIS — C7801 Secondary malignant neoplasm of right lung: Secondary | ICD-10-CM | POA: Diagnosis not present

## 2019-10-20 MED ORDER — CEPHALEXIN 500 MG PO CAPS: 500.0000 mg | ORAL_CAPSULE | Freq: Two times a day (BID) | ORAL | 0 refills | Status: DC

## 2019-10-21 DIAGNOSIS — R0602 Shortness of breath: Secondary | ICD-10-CM | POA: Diagnosis not present

## 2019-10-21 DIAGNOSIS — J90 Pleural effusion, not elsewhere classified: Secondary | ICD-10-CM | POA: Diagnosis not present

## 2019-10-21 DIAGNOSIS — C50811 Malignant neoplasm of overlapping sites of right female breast: Secondary | ICD-10-CM | POA: Diagnosis not present

## 2019-10-21 DIAGNOSIS — G893 Neoplasm related pain (acute) (chronic): Secondary | ICD-10-CM | POA: Diagnosis not present

## 2019-10-21 DIAGNOSIS — C7801 Secondary malignant neoplasm of right lung: Secondary | ICD-10-CM | POA: Diagnosis not present

## 2019-10-21 DIAGNOSIS — D649 Anemia, unspecified: Secondary | ICD-10-CM | POA: Diagnosis not present

## 2019-10-21 DIAGNOSIS — C7951 Secondary malignant neoplasm of bone: Secondary | ICD-10-CM | POA: Diagnosis not present

## 2019-10-22 DIAGNOSIS — G893 Neoplasm related pain (acute) (chronic): Secondary | ICD-10-CM | POA: Diagnosis not present

## 2019-10-22 DIAGNOSIS — J90 Pleural effusion, not elsewhere classified: Secondary | ICD-10-CM | POA: Diagnosis not present

## 2019-10-22 DIAGNOSIS — C7801 Secondary malignant neoplasm of right lung: Secondary | ICD-10-CM | POA: Diagnosis not present

## 2019-10-22 DIAGNOSIS — D649 Anemia, unspecified: Secondary | ICD-10-CM | POA: Diagnosis not present

## 2019-10-22 DIAGNOSIS — C50811 Malignant neoplasm of overlapping sites of right female breast: Secondary | ICD-10-CM | POA: Diagnosis not present

## 2019-10-22 DIAGNOSIS — R0602 Shortness of breath: Secondary | ICD-10-CM | POA: Diagnosis not present

## 2019-10-22 DIAGNOSIS — C7951 Secondary malignant neoplasm of bone: Secondary | ICD-10-CM | POA: Diagnosis not present

## 2019-10-23 DIAGNOSIS — C7801 Secondary malignant neoplasm of right lung: Secondary | ICD-10-CM | POA: Diagnosis not present

## 2019-10-23 DIAGNOSIS — G893 Neoplasm related pain (acute) (chronic): Secondary | ICD-10-CM | POA: Diagnosis not present

## 2019-10-23 DIAGNOSIS — C7951 Secondary malignant neoplasm of bone: Secondary | ICD-10-CM | POA: Diagnosis not present

## 2019-10-23 DIAGNOSIS — D649 Anemia, unspecified: Secondary | ICD-10-CM | POA: Diagnosis not present

## 2019-10-23 DIAGNOSIS — R0602 Shortness of breath: Secondary | ICD-10-CM | POA: Diagnosis not present

## 2019-10-23 DIAGNOSIS — C50811 Malignant neoplasm of overlapping sites of right female breast: Secondary | ICD-10-CM | POA: Diagnosis not present

## 2019-10-23 DIAGNOSIS — J90 Pleural effusion, not elsewhere classified: Secondary | ICD-10-CM | POA: Diagnosis not present

## 2019-10-27 DIAGNOSIS — R0602 Shortness of breath: Secondary | ICD-10-CM | POA: Diagnosis not present

## 2019-10-27 DIAGNOSIS — C7801 Secondary malignant neoplasm of right lung: Secondary | ICD-10-CM | POA: Diagnosis not present

## 2019-10-27 DIAGNOSIS — J9621 Acute and chronic respiratory failure with hypoxia: Secondary | ICD-10-CM | POA: Diagnosis not present

## 2019-10-27 DIAGNOSIS — C7951 Secondary malignant neoplasm of bone: Secondary | ICD-10-CM | POA: Diagnosis not present

## 2019-10-27 DIAGNOSIS — C50811 Malignant neoplasm of overlapping sites of right female breast: Secondary | ICD-10-CM | POA: Diagnosis not present

## 2019-10-27 DIAGNOSIS — J90 Pleural effusion, not elsewhere classified: Secondary | ICD-10-CM | POA: Diagnosis not present

## 2019-10-27 DIAGNOSIS — D649 Anemia, unspecified: Secondary | ICD-10-CM | POA: Diagnosis not present

## 2019-10-27 DIAGNOSIS — G893 Neoplasm related pain (acute) (chronic): Secondary | ICD-10-CM | POA: Diagnosis not present

## 2019-10-28 DIAGNOSIS — D649 Anemia, unspecified: Secondary | ICD-10-CM | POA: Diagnosis not present

## 2019-10-28 DIAGNOSIS — C7951 Secondary malignant neoplasm of bone: Secondary | ICD-10-CM | POA: Diagnosis not present

## 2019-10-28 DIAGNOSIS — C50811 Malignant neoplasm of overlapping sites of right female breast: Secondary | ICD-10-CM | POA: Diagnosis not present

## 2019-10-28 DIAGNOSIS — G893 Neoplasm related pain (acute) (chronic): Secondary | ICD-10-CM | POA: Diagnosis not present

## 2019-10-28 DIAGNOSIS — C7801 Secondary malignant neoplasm of right lung: Secondary | ICD-10-CM | POA: Diagnosis not present

## 2019-10-28 DIAGNOSIS — R0602 Shortness of breath: Secondary | ICD-10-CM | POA: Diagnosis not present

## 2019-10-28 DIAGNOSIS — J90 Pleural effusion, not elsewhere classified: Secondary | ICD-10-CM | POA: Diagnosis not present

## 2019-10-29 DIAGNOSIS — M810 Age-related osteoporosis without current pathological fracture: Secondary | ICD-10-CM | POA: Diagnosis not present

## 2019-10-29 DIAGNOSIS — M47816 Spondylosis without myelopathy or radiculopathy, lumbar region: Secondary | ICD-10-CM | POA: Diagnosis not present

## 2019-10-29 DIAGNOSIS — J9611 Chronic respiratory failure with hypoxia: Secondary | ICD-10-CM | POA: Diagnosis not present

## 2019-10-29 DIAGNOSIS — D649 Anemia, unspecified: Secondary | ICD-10-CM | POA: Diagnosis not present

## 2019-10-29 DIAGNOSIS — D630 Anemia in neoplastic disease: Secondary | ICD-10-CM | POA: Diagnosis not present

## 2019-10-29 DIAGNOSIS — R0602 Shortness of breath: Secondary | ICD-10-CM | POA: Diagnosis not present

## 2019-10-29 DIAGNOSIS — K5900 Constipation, unspecified: Secondary | ICD-10-CM | POA: Diagnosis not present

## 2019-10-29 DIAGNOSIS — I10 Essential (primary) hypertension: Secondary | ICD-10-CM | POA: Diagnosis not present

## 2019-10-29 DIAGNOSIS — Z9981 Dependence on supplemental oxygen: Secondary | ICD-10-CM | POA: Diagnosis not present

## 2019-10-29 DIAGNOSIS — Z170 Estrogen receptor positive status [ER+]: Secondary | ICD-10-CM | POA: Diagnosis not present

## 2019-10-29 DIAGNOSIS — C50811 Malignant neoplasm of overlapping sites of right female breast: Secondary | ICD-10-CM | POA: Diagnosis not present

## 2019-10-29 DIAGNOSIS — J90 Pleural effusion, not elsewhere classified: Secondary | ICD-10-CM | POA: Diagnosis not present

## 2019-10-29 DIAGNOSIS — Z9181 History of falling: Secondary | ICD-10-CM | POA: Diagnosis not present

## 2019-10-29 DIAGNOSIS — C7951 Secondary malignant neoplasm of bone: Secondary | ICD-10-CM | POA: Diagnosis not present

## 2019-10-29 DIAGNOSIS — C7801 Secondary malignant neoplasm of right lung: Secondary | ICD-10-CM | POA: Diagnosis not present

## 2019-10-29 DIAGNOSIS — G893 Neoplasm related pain (acute) (chronic): Secondary | ICD-10-CM | POA: Diagnosis not present

## 2019-10-29 DIAGNOSIS — I959 Hypotension, unspecified: Secondary | ICD-10-CM | POA: Diagnosis not present

## 2019-10-30 DIAGNOSIS — D649 Anemia, unspecified: Secondary | ICD-10-CM | POA: Diagnosis not present

## 2019-10-30 DIAGNOSIS — J90 Pleural effusion, not elsewhere classified: Secondary | ICD-10-CM | POA: Diagnosis not present

## 2019-10-30 DIAGNOSIS — G893 Neoplasm related pain (acute) (chronic): Secondary | ICD-10-CM | POA: Diagnosis not present

## 2019-10-30 DIAGNOSIS — R0602 Shortness of breath: Secondary | ICD-10-CM | POA: Diagnosis not present

## 2019-10-30 DIAGNOSIS — C7801 Secondary malignant neoplasm of right lung: Secondary | ICD-10-CM | POA: Diagnosis not present

## 2019-10-30 DIAGNOSIS — C7951 Secondary malignant neoplasm of bone: Secondary | ICD-10-CM | POA: Diagnosis not present

## 2019-10-30 DIAGNOSIS — C50811 Malignant neoplasm of overlapping sites of right female breast: Secondary | ICD-10-CM | POA: Diagnosis not present

## 2019-11-03 ENCOUNTER — Inpatient Hospital Stay: Payer: Medicaid Other | Attending: Adult Health

## 2019-11-03 ENCOUNTER — Other Ambulatory Visit: Payer: Self-pay

## 2019-11-03 ENCOUNTER — Encounter: Payer: Self-pay | Admitting: Licensed Clinical Social Worker

## 2019-11-03 ENCOUNTER — Inpatient Hospital Stay: Payer: Medicaid Other

## 2019-11-03 VITALS — BP 128/72 | HR 78 | Temp 98.2°F | Resp 18

## 2019-11-03 DIAGNOSIS — J90 Pleural effusion, not elsewhere classified: Secondary | ICD-10-CM

## 2019-11-03 DIAGNOSIS — C7801 Secondary malignant neoplasm of right lung: Secondary | ICD-10-CM

## 2019-11-03 DIAGNOSIS — R0602 Shortness of breath: Secondary | ICD-10-CM

## 2019-11-03 DIAGNOSIS — C50811 Malignant neoplasm of overlapping sites of right female breast: Secondary | ICD-10-CM

## 2019-11-03 DIAGNOSIS — C7951 Secondary malignant neoplasm of bone: Secondary | ICD-10-CM | POA: Diagnosis not present

## 2019-11-03 DIAGNOSIS — C50911 Malignant neoplasm of unspecified site of right female breast: Secondary | ICD-10-CM | POA: Insufficient documentation

## 2019-11-03 DIAGNOSIS — D649 Anemia, unspecified: Secondary | ICD-10-CM

## 2019-11-03 LAB — COMPREHENSIVE METABOLIC PANEL
ALT: 14 U/L (ref 0–44)
AST: 19 U/L (ref 15–41)
Albumin: 3.6 g/dL (ref 3.5–5.0)
Alkaline Phosphatase: 104 U/L (ref 38–126)
Anion gap: 7 (ref 5–15)
BUN: 18 mg/dL (ref 6–20)
CO2: 30 mmol/L (ref 22–32)
Calcium: 9.3 mg/dL (ref 8.9–10.3)
Chloride: 102 mmol/L (ref 98–111)
Creatinine, Ser: 1 mg/dL (ref 0.44–1.00)
GFR calc Af Amer: >60 mL/min (ref >60)
GFR calc non Af Amer: >60 mL/min (ref >60)
Glucose, Bld: 98 mg/dL (ref 70–99)
Potassium: 4.9 mmol/L (ref 3.5–5.1)
Sodium: 139 mmol/L (ref 135–145)
Total Bilirubin: 0.4 mg/dL (ref 0.3–1.2)
Total Protein: 7.2 g/dL (ref 6.5–8.1)

## 2019-11-03 LAB — CBC WITH DIFFERENTIAL/PLATELET
Abs Immature Granulocytes: 0.01 10*3/uL (ref 0.00–0.07)
Basophils Absolute: 0.1 10*3/uL (ref 0.0–0.1)
Basophils Relative: 2 %
Eosinophils Absolute: 0.1 10*3/uL (ref 0.0–0.5)
Eosinophils Relative: 3 %
HCT: 30.6 % — ABNORMAL LOW (ref 36.0–46.0)
Hemoglobin: 9.7 g/dL — ABNORMAL LOW (ref 12.0–15.0)
Immature Granulocytes: 0 %
Lymphocytes Relative: 36 %
Lymphs Abs: 1.1 10*3/uL (ref 0.7–4.0)
MCH: 36.1 pg — ABNORMAL HIGH (ref 26.0–34.0)
MCHC: 31.7 g/dL (ref 30.0–36.0)
MCV: 113.8 fL — ABNORMAL HIGH (ref 80.0–100.0)
Monocytes Absolute: 0.2 10*3/uL (ref 0.1–1.0)
Monocytes Relative: 8 %
Neutro Abs: 1.5 10*3/uL — ABNORMAL LOW (ref 1.7–7.7)
Neutrophils Relative %: 51 %
Platelets: 290 10*3/uL (ref 150–400)
RBC: 2.69 MIL/uL — ABNORMAL LOW (ref 3.87–5.11)
RDW: 14.2 % (ref 11.5–15.5)
WBC: 2.9 10*3/uL — ABNORMAL LOW (ref 4.0–10.5)
nRBC: 0 % (ref 0.0–0.2)

## 2019-11-03 MED ORDER — DENOSUMAB 120 MG/1.7ML ~~LOC~~ SOLN
SUBCUTANEOUS | Status: AC
  Filled 2019-11-03: qty 1.7

## 2019-11-03 MED ORDER — DENOSUMAB 120 MG/1.7ML ~~LOC~~ SOLN
120.0000 mg | Freq: Once | SUBCUTANEOUS | Status: AC
  Administered 2019-11-03: 120 mg via SUBCUTANEOUS

## 2019-11-03 NOTE — Patient Instructions (Signed)
Denosumab injection What is this medicine? DENOSUMAB (den oh sue mab) slows bone breakdown. Prolia is used to treat osteoporosis in women after menopause and in men, and in people who are taking corticosteroids for 6 months or more. Xgeva is used to treat a high calcium level due to cancer and to prevent bone fractures and other bone problems caused by multiple myeloma or cancer bone metastases. Xgeva is also used to treat giant cell tumor of the bone. This medicine may be used for other purposes; ask your health care provider or pharmacist if you have questions. COMMON BRAND NAME(S): Prolia, XGEVA What should I tell my health care provider before I take this medicine? They need to know if you have any of these conditions:  dental disease  having surgery or tooth extraction  infection  kidney disease  low levels of calcium or Vitamin D in the blood  malnutrition  on hemodialysis  skin conditions or sensitivity  thyroid or parathyroid disease  an unusual reaction to denosumab, other medicines, foods, dyes, or preservatives  pregnant or trying to get pregnant  breast-feeding How should I use this medicine? This medicine is for injection under the skin. It is given by a health care professional in a hospital or clinic setting. A special MedGuide will be given to you before each treatment. Be sure to read this information carefully each time. For Prolia, talk to your pediatrician regarding the use of this medicine in children. Special care may be needed. For Xgeva, talk to your pediatrician regarding the use of this medicine in children. While this drug may be prescribed for children as young as 13 years for selected conditions, precautions do apply. Overdosage: If you think you have taken too much of this medicine contact a poison control center or emergency room at once. NOTE: This medicine is only for you. Do not share this medicine with others. What if I miss a dose? It is  important not to miss your dose. Call your doctor or health care professional if you are unable to keep an appointment. What may interact with this medicine? Do not take this medicine with any of the following medications:  other medicines containing denosumab This medicine may also interact with the following medications:  medicines that lower your chance of fighting infection  steroid medicines like prednisone or cortisone This list may not describe all possible interactions. Give your health care provider a list of all the medicines, herbs, non-prescription drugs, or dietary supplements you use. Also tell them if you smoke, drink alcohol, or use illegal drugs. Some items may interact with your medicine. What should I watch for while using this medicine? Visit your doctor or health care professional for regular checks on your progress. Your doctor or health care professional may order blood tests and other tests to see how you are doing. Call your doctor or health care professional for advice if you get a fever, chills or sore throat, or other symptoms of a cold or flu. Do not treat yourself. This drug may decrease your body's ability to fight infection. Try to avoid being around people who are sick. You should make sure you get enough calcium and vitamin D while you are taking this medicine, unless your doctor tells you not to. Discuss the foods you eat and the vitamins you take with your health care professional. See your dentist regularly. Brush and floss your teeth as directed. Before you have any dental work done, tell your dentist you are   receiving this medicine. Do not become pregnant while taking this medicine or for 5 months after stopping it. Talk with your doctor or health care professional about your birth control options while taking this medicine. Women should inform their doctor if they wish to become pregnant or think they might be pregnant. There is a potential for serious side  effects to an unborn child. Talk to your health care professional or pharmacist for more information. What side effects may I notice from receiving this medicine? Side effects that you should report to your doctor or health care professional as soon as possible:  allergic reactions like skin rash, itching or hives, swelling of the face, lips, or tongue  bone pain  breathing problems  dizziness  jaw pain, especially after dental work  redness, blistering, peeling of the skin  signs and symptoms of infection like fever or chills; cough; sore throat; pain or trouble passing urine  signs of low calcium like fast heartbeat, muscle cramps or muscle pain; pain, tingling, numbness in the hands or feet; seizures  unusual bleeding or bruising  unusually weak or tired Side effects that usually do not require medical attention (report to your doctor or health care professional if they continue or are bothersome):  constipation  diarrhea  headache  joint pain  loss of appetite  muscle pain  runny nose  tiredness  upset stomach This list may not describe all possible side effects. Call your doctor for medical advice about side effects. You may report side effects to FDA at 1-800-FDA-1088. Where should I keep my medicine? This medicine is only given in a clinic, doctor's office, or other health care setting and will not be stored at home. NOTE: This sheet is a summary. It may not cover all possible information. If you have questions about this medicine, talk to your doctor, pharmacist, or health care provider.  2020 Elsevier/Gold Standard (2017-09-20 16:10:44)

## 2019-11-03 NOTE — Progress Notes (Signed)
[facility] CSW Progress Note  Clinical Education officer, museum received call from patient asking about process for Access [place] as they are changing from punch cards to [employer] card. CSW clarified with [place] transit authority. Currently, remaining punch cards can be given and patient can then exchange them to have the value added to her Umo card. CSW conveyed message to patient and requested CSW Dalene Seltzer bring punch card to patient while at [facility] today.    Edwinna Areola Stoisits , LCSW

## 2019-11-04 ENCOUNTER — Telehealth: Payer: Self-pay | Admitting: *Deleted

## 2019-11-04 DIAGNOSIS — D649 Anemia, unspecified: Secondary | ICD-10-CM | POA: Diagnosis not present

## 2019-11-04 DIAGNOSIS — C7951 Secondary malignant neoplasm of bone: Secondary | ICD-10-CM | POA: Diagnosis not present

## 2019-11-04 DIAGNOSIS — J90 Pleural effusion, not elsewhere classified: Secondary | ICD-10-CM | POA: Diagnosis not present

## 2019-11-04 DIAGNOSIS — C50811 Malignant neoplasm of overlapping sites of right female breast: Secondary | ICD-10-CM | POA: Diagnosis not present

## 2019-11-04 DIAGNOSIS — C7801 Secondary malignant neoplasm of right lung: Secondary | ICD-10-CM | POA: Diagnosis not present

## 2019-11-04 DIAGNOSIS — R0602 Shortness of breath: Secondary | ICD-10-CM | POA: Diagnosis not present

## 2019-11-04 DIAGNOSIS — G893 Neoplasm related pain (acute) (chronic): Secondary | ICD-10-CM | POA: Diagnosis not present

## 2019-11-04 LAB — CANCER ANTIGEN 27.29: CA 27.29: 40.6 U/mL — ABNORMAL HIGH (ref 0.0–38.6)

## 2019-11-04 NOTE — Telephone Encounter (Signed)
Faxed last 2 office notes to [facility] in support of request for DME

## 2019-11-05 DIAGNOSIS — J90 Pleural effusion, not elsewhere classified: Secondary | ICD-10-CM | POA: Diagnosis not present

## 2019-11-05 DIAGNOSIS — C50811 Malignant neoplasm of overlapping sites of right female breast: Secondary | ICD-10-CM | POA: Diagnosis not present

## 2019-11-05 DIAGNOSIS — R0602 Shortness of breath: Secondary | ICD-10-CM | POA: Diagnosis not present

## 2019-11-05 DIAGNOSIS — C7801 Secondary malignant neoplasm of right lung: Secondary | ICD-10-CM | POA: Diagnosis not present

## 2019-11-05 DIAGNOSIS — C7951 Secondary malignant neoplasm of bone: Secondary | ICD-10-CM | POA: Diagnosis not present

## 2019-11-05 DIAGNOSIS — G893 Neoplasm related pain (acute) (chronic): Secondary | ICD-10-CM | POA: Diagnosis not present

## 2019-11-05 DIAGNOSIS — D649 Anemia, unspecified: Secondary | ICD-10-CM | POA: Diagnosis not present

## 2019-11-06 DIAGNOSIS — C7801 Secondary malignant neoplasm of right lung: Secondary | ICD-10-CM | POA: Diagnosis not present

## 2019-11-06 DIAGNOSIS — G893 Neoplasm related pain (acute) (chronic): Secondary | ICD-10-CM | POA: Diagnosis not present

## 2019-11-06 DIAGNOSIS — J90 Pleural effusion, not elsewhere classified: Secondary | ICD-10-CM | POA: Diagnosis not present

## 2019-11-06 DIAGNOSIS — C7951 Secondary malignant neoplasm of bone: Secondary | ICD-10-CM | POA: Diagnosis not present

## 2019-11-06 DIAGNOSIS — D649 Anemia, unspecified: Secondary | ICD-10-CM | POA: Diagnosis not present

## 2019-11-06 DIAGNOSIS — R0602 Shortness of breath: Secondary | ICD-10-CM | POA: Diagnosis not present

## 2019-11-06 DIAGNOSIS — C50811 Malignant neoplasm of overlapping sites of right female breast: Secondary | ICD-10-CM | POA: Diagnosis not present

## 2019-11-09 DIAGNOSIS — R0602 Shortness of breath: Secondary | ICD-10-CM | POA: Diagnosis not present

## 2019-11-09 DIAGNOSIS — C50811 Malignant neoplasm of overlapping sites of right female breast: Secondary | ICD-10-CM | POA: Diagnosis not present

## 2019-11-09 DIAGNOSIS — C7801 Secondary malignant neoplasm of right lung: Secondary | ICD-10-CM | POA: Diagnosis not present

## 2019-11-09 DIAGNOSIS — D649 Anemia, unspecified: Secondary | ICD-10-CM | POA: Diagnosis not present

## 2019-11-09 DIAGNOSIS — G893 Neoplasm related pain (acute) (chronic): Secondary | ICD-10-CM | POA: Diagnosis not present

## 2019-11-09 DIAGNOSIS — J90 Pleural effusion, not elsewhere classified: Secondary | ICD-10-CM | POA: Diagnosis not present

## 2019-11-09 DIAGNOSIS — C7951 Secondary malignant neoplasm of bone: Secondary | ICD-10-CM | POA: Diagnosis not present

## 2019-11-10 DIAGNOSIS — D649 Anemia, unspecified: Secondary | ICD-10-CM | POA: Diagnosis not present

## 2019-11-10 DIAGNOSIS — C7801 Secondary malignant neoplasm of right lung: Secondary | ICD-10-CM | POA: Diagnosis not present

## 2019-11-10 DIAGNOSIS — R0602 Shortness of breath: Secondary | ICD-10-CM | POA: Diagnosis not present

## 2019-11-10 DIAGNOSIS — J90 Pleural effusion, not elsewhere classified: Secondary | ICD-10-CM | POA: Diagnosis not present

## 2019-11-10 DIAGNOSIS — C50811 Malignant neoplasm of overlapping sites of right female breast: Secondary | ICD-10-CM | POA: Diagnosis not present

## 2019-11-10 DIAGNOSIS — C7951 Secondary malignant neoplasm of bone: Secondary | ICD-10-CM | POA: Diagnosis not present

## 2019-11-10 DIAGNOSIS — G893 Neoplasm related pain (acute) (chronic): Secondary | ICD-10-CM | POA: Diagnosis not present

## 2019-11-11 DIAGNOSIS — R0602 Shortness of breath: Secondary | ICD-10-CM | POA: Diagnosis not present

## 2019-11-11 DIAGNOSIS — C7951 Secondary malignant neoplasm of bone: Secondary | ICD-10-CM | POA: Diagnosis not present

## 2019-11-11 DIAGNOSIS — D649 Anemia, unspecified: Secondary | ICD-10-CM | POA: Diagnosis not present

## 2019-11-11 DIAGNOSIS — G893 Neoplasm related pain (acute) (chronic): Secondary | ICD-10-CM | POA: Diagnosis not present

## 2019-11-11 DIAGNOSIS — J90 Pleural effusion, not elsewhere classified: Secondary | ICD-10-CM | POA: Diagnosis not present

## 2019-11-11 DIAGNOSIS — C7801 Secondary malignant neoplasm of right lung: Secondary | ICD-10-CM | POA: Diagnosis not present

## 2019-11-11 DIAGNOSIS — C50811 Malignant neoplasm of overlapping sites of right female breast: Secondary | ICD-10-CM | POA: Diagnosis not present

## 2019-11-12 DIAGNOSIS — C7801 Secondary malignant neoplasm of right lung: Secondary | ICD-10-CM | POA: Diagnosis not present

## 2019-11-12 DIAGNOSIS — C50811 Malignant neoplasm of overlapping sites of right female breast: Secondary | ICD-10-CM | POA: Diagnosis not present

## 2019-11-12 DIAGNOSIS — G893 Neoplasm related pain (acute) (chronic): Secondary | ICD-10-CM | POA: Diagnosis not present

## 2019-11-12 DIAGNOSIS — R0602 Shortness of breath: Secondary | ICD-10-CM | POA: Diagnosis not present

## 2019-11-12 DIAGNOSIS — J90 Pleural effusion, not elsewhere classified: Secondary | ICD-10-CM | POA: Diagnosis not present

## 2019-11-12 DIAGNOSIS — C7951 Secondary malignant neoplasm of bone: Secondary | ICD-10-CM | POA: Diagnosis not present

## 2019-11-12 DIAGNOSIS — D649 Anemia, unspecified: Secondary | ICD-10-CM | POA: Diagnosis not present

## 2019-11-13 ENCOUNTER — Other Ambulatory Visit: Payer: Self-pay | Admitting: Pharmacist

## 2019-11-13 DIAGNOSIS — R0602 Shortness of breath: Secondary | ICD-10-CM | POA: Diagnosis not present

## 2019-11-13 DIAGNOSIS — G893 Neoplasm related pain (acute) (chronic): Secondary | ICD-10-CM | POA: Diagnosis not present

## 2019-11-13 DIAGNOSIS — C7951 Secondary malignant neoplasm of bone: Secondary | ICD-10-CM | POA: Diagnosis not present

## 2019-11-13 DIAGNOSIS — C50811 Malignant neoplasm of overlapping sites of right female breast: Secondary | ICD-10-CM

## 2019-11-13 DIAGNOSIS — J90 Pleural effusion, not elsewhere classified: Secondary | ICD-10-CM | POA: Diagnosis not present

## 2019-11-13 DIAGNOSIS — D649 Anemia, unspecified: Secondary | ICD-10-CM | POA: Diagnosis not present

## 2019-11-13 DIAGNOSIS — C7801 Secondary malignant neoplasm of right lung: Secondary | ICD-10-CM | POA: Diagnosis not present

## 2019-11-13 MED ORDER — PALBOCICLIB 100 MG PO TABS: 100.0000 mg | ORAL_TABLET | Freq: Every day | ORAL | 1 refills | Status: DC

## 2019-11-13 NOTE — Progress Notes (Signed)
Oral Chemotherapy Pharmacist Encounter   Patient's manufacturer assistance has ended for [place]. She now had Medicaid medication coverage. Prescription with her remaining refills sent to [facility].  [name], PharmD, BCPS, BCOP, CPP Hematology/Oncology Clinical Pharmacist [facility] [phone]  11/13/2019 9:02 AM

## 2019-11-16 DIAGNOSIS — C50811 Malignant neoplasm of overlapping sites of right female breast: Secondary | ICD-10-CM | POA: Diagnosis not present

## 2019-11-16 DIAGNOSIS — G893 Neoplasm related pain (acute) (chronic): Secondary | ICD-10-CM | POA: Diagnosis not present

## 2019-11-16 DIAGNOSIS — R0602 Shortness of breath: Secondary | ICD-10-CM | POA: Diagnosis not present

## 2019-11-16 DIAGNOSIS — C7951 Secondary malignant neoplasm of bone: Secondary | ICD-10-CM | POA: Diagnosis not present

## 2019-11-16 DIAGNOSIS — C7801 Secondary malignant neoplasm of right lung: Secondary | ICD-10-CM | POA: Diagnosis not present

## 2019-11-16 DIAGNOSIS — J90 Pleural effusion, not elsewhere classified: Secondary | ICD-10-CM | POA: Diagnosis not present

## 2019-11-16 DIAGNOSIS — D649 Anemia, unspecified: Secondary | ICD-10-CM | POA: Diagnosis not present

## 2019-11-16 MED FILL — IBRANCE 100 MG TABS: 100 | 21 days supply | Qty: 21 | Fill #0

## 2019-11-17 DIAGNOSIS — C7951 Secondary malignant neoplasm of bone: Secondary | ICD-10-CM | POA: Diagnosis not present

## 2019-11-17 DIAGNOSIS — D649 Anemia, unspecified: Secondary | ICD-10-CM | POA: Diagnosis not present

## 2019-11-17 DIAGNOSIS — G893 Neoplasm related pain (acute) (chronic): Secondary | ICD-10-CM | POA: Diagnosis not present

## 2019-11-17 DIAGNOSIS — R0602 Shortness of breath: Secondary | ICD-10-CM | POA: Diagnosis not present

## 2019-11-17 DIAGNOSIS — C50811 Malignant neoplasm of overlapping sites of right female breast: Secondary | ICD-10-CM | POA: Diagnosis not present

## 2019-11-17 DIAGNOSIS — C7801 Secondary malignant neoplasm of right lung: Secondary | ICD-10-CM | POA: Diagnosis not present

## 2019-11-17 DIAGNOSIS — J90 Pleural effusion, not elsewhere classified: Secondary | ICD-10-CM | POA: Diagnosis not present

## 2019-11-18 DIAGNOSIS — G893 Neoplasm related pain (acute) (chronic): Secondary | ICD-10-CM | POA: Diagnosis not present

## 2019-11-18 DIAGNOSIS — C7951 Secondary malignant neoplasm of bone: Secondary | ICD-10-CM | POA: Diagnosis not present

## 2019-11-18 DIAGNOSIS — C50811 Malignant neoplasm of overlapping sites of right female breast: Secondary | ICD-10-CM | POA: Diagnosis not present

## 2019-11-18 DIAGNOSIS — R0602 Shortness of breath: Secondary | ICD-10-CM | POA: Diagnosis not present

## 2019-11-18 DIAGNOSIS — C7801 Secondary malignant neoplasm of right lung: Secondary | ICD-10-CM | POA: Diagnosis not present

## 2019-11-18 DIAGNOSIS — D649 Anemia, unspecified: Secondary | ICD-10-CM | POA: Diagnosis not present

## 2019-11-18 DIAGNOSIS — J90 Pleural effusion, not elsewhere classified: Secondary | ICD-10-CM | POA: Diagnosis not present

## 2019-11-19 DIAGNOSIS — D649 Anemia, unspecified: Secondary | ICD-10-CM | POA: Diagnosis not present

## 2019-11-19 DIAGNOSIS — C50811 Malignant neoplasm of overlapping sites of right female breast: Secondary | ICD-10-CM | POA: Diagnosis not present

## 2019-11-19 DIAGNOSIS — C7801 Secondary malignant neoplasm of right lung: Secondary | ICD-10-CM | POA: Diagnosis not present

## 2019-11-19 DIAGNOSIS — C7951 Secondary malignant neoplasm of bone: Secondary | ICD-10-CM | POA: Diagnosis not present

## 2019-11-19 DIAGNOSIS — J90 Pleural effusion, not elsewhere classified: Secondary | ICD-10-CM | POA: Diagnosis not present

## 2019-11-19 DIAGNOSIS — G893 Neoplasm related pain (acute) (chronic): Secondary | ICD-10-CM | POA: Diagnosis not present

## 2019-11-19 DIAGNOSIS — R0602 Shortness of breath: Secondary | ICD-10-CM | POA: Diagnosis not present

## 2019-11-20 DIAGNOSIS — D649 Anemia, unspecified: Secondary | ICD-10-CM | POA: Diagnosis not present

## 2019-11-20 DIAGNOSIS — C7801 Secondary malignant neoplasm of right lung: Secondary | ICD-10-CM | POA: Diagnosis not present

## 2019-11-20 DIAGNOSIS — C7951 Secondary malignant neoplasm of bone: Secondary | ICD-10-CM | POA: Diagnosis not present

## 2019-11-20 DIAGNOSIS — J90 Pleural effusion, not elsewhere classified: Secondary | ICD-10-CM | POA: Diagnosis not present

## 2019-11-20 DIAGNOSIS — G893 Neoplasm related pain (acute) (chronic): Secondary | ICD-10-CM | POA: Diagnosis not present

## 2019-11-20 DIAGNOSIS — R0602 Shortness of breath: Secondary | ICD-10-CM | POA: Diagnosis not present

## 2019-11-20 DIAGNOSIS — C50811 Malignant neoplasm of overlapping sites of right female breast: Secondary | ICD-10-CM | POA: Diagnosis not present

## 2019-11-23 DIAGNOSIS — R0602 Shortness of breath: Secondary | ICD-10-CM | POA: Diagnosis not present

## 2019-11-23 DIAGNOSIS — C50811 Malignant neoplasm of overlapping sites of right female breast: Secondary | ICD-10-CM | POA: Diagnosis not present

## 2019-11-23 DIAGNOSIS — C7951 Secondary malignant neoplasm of bone: Secondary | ICD-10-CM | POA: Diagnosis not present

## 2019-11-23 DIAGNOSIS — G893 Neoplasm related pain (acute) (chronic): Secondary | ICD-10-CM | POA: Diagnosis not present

## 2019-11-23 DIAGNOSIS — C7801 Secondary malignant neoplasm of right lung: Secondary | ICD-10-CM | POA: Diagnosis not present

## 2019-11-23 DIAGNOSIS — D649 Anemia, unspecified: Secondary | ICD-10-CM | POA: Diagnosis not present

## 2019-11-23 DIAGNOSIS — J90 Pleural effusion, not elsewhere classified: Secondary | ICD-10-CM | POA: Diagnosis not present

## 2019-11-24 DIAGNOSIS — D649 Anemia, unspecified: Secondary | ICD-10-CM | POA: Diagnosis not present

## 2019-11-24 DIAGNOSIS — R0602 Shortness of breath: Secondary | ICD-10-CM | POA: Diagnosis not present

## 2019-11-24 DIAGNOSIS — C50811 Malignant neoplasm of overlapping sites of right female breast: Secondary | ICD-10-CM | POA: Diagnosis not present

## 2019-11-24 DIAGNOSIS — C7801 Secondary malignant neoplasm of right lung: Secondary | ICD-10-CM | POA: Diagnosis not present

## 2019-11-24 DIAGNOSIS — J90 Pleural effusion, not elsewhere classified: Secondary | ICD-10-CM | POA: Diagnosis not present

## 2019-11-24 DIAGNOSIS — C7951 Secondary malignant neoplasm of bone: Secondary | ICD-10-CM | POA: Diagnosis not present

## 2019-11-24 DIAGNOSIS — G893 Neoplasm related pain (acute) (chronic): Secondary | ICD-10-CM | POA: Diagnosis not present

## 2019-11-25 DIAGNOSIS — C7801 Secondary malignant neoplasm of right lung: Secondary | ICD-10-CM | POA: Diagnosis not present

## 2019-11-25 DIAGNOSIS — R0602 Shortness of breath: Secondary | ICD-10-CM | POA: Diagnosis not present

## 2019-11-25 DIAGNOSIS — C7951 Secondary malignant neoplasm of bone: Secondary | ICD-10-CM | POA: Diagnosis not present

## 2019-11-25 DIAGNOSIS — C50811 Malignant neoplasm of overlapping sites of right female breast: Secondary | ICD-10-CM | POA: Diagnosis not present

## 2019-11-25 DIAGNOSIS — J90 Pleural effusion, not elsewhere classified: Secondary | ICD-10-CM | POA: Diagnosis not present

## 2019-11-25 DIAGNOSIS — G893 Neoplasm related pain (acute) (chronic): Secondary | ICD-10-CM | POA: Diagnosis not present

## 2019-11-25 DIAGNOSIS — D649 Anemia, unspecified: Secondary | ICD-10-CM | POA: Diagnosis not present

## 2019-11-26 ENCOUNTER — Other Ambulatory Visit: Payer: Self-pay | Admitting: Adult Health

## 2019-11-26 DIAGNOSIS — D649 Anemia, unspecified: Secondary | ICD-10-CM | POA: Diagnosis not present

## 2019-11-26 DIAGNOSIS — J90 Pleural effusion, not elsewhere classified: Secondary | ICD-10-CM | POA: Diagnosis not present

## 2019-11-26 DIAGNOSIS — C50811 Malignant neoplasm of overlapping sites of right female breast: Secondary | ICD-10-CM | POA: Diagnosis not present

## 2019-11-26 DIAGNOSIS — C7801 Secondary malignant neoplasm of right lung: Secondary | ICD-10-CM | POA: Diagnosis not present

## 2019-11-26 DIAGNOSIS — S22000A Wedge compression fracture of unspecified thoracic vertebra, initial encounter for closed fracture: Secondary | ICD-10-CM | POA: Diagnosis not present

## 2019-11-26 DIAGNOSIS — G893 Neoplasm related pain (acute) (chronic): Secondary | ICD-10-CM | POA: Diagnosis not present

## 2019-11-26 DIAGNOSIS — R0602 Shortness of breath: Secondary | ICD-10-CM | POA: Diagnosis not present

## 2019-11-26 DIAGNOSIS — C7951 Secondary malignant neoplasm of bone: Secondary | ICD-10-CM | POA: Diagnosis not present

## 2019-11-27 ENCOUNTER — Other Ambulatory Visit: Payer: Self-pay | Admitting: Family Medicine

## 2019-11-27 DIAGNOSIS — C50811 Malignant neoplasm of overlapping sites of right female breast: Secondary | ICD-10-CM | POA: Diagnosis not present

## 2019-11-27 DIAGNOSIS — C7801 Secondary malignant neoplasm of right lung: Secondary | ICD-10-CM | POA: Diagnosis not present

## 2019-11-27 DIAGNOSIS — Z170 Estrogen receptor positive status [ER+]: Secondary | ICD-10-CM

## 2019-11-27 DIAGNOSIS — J90 Pleural effusion, not elsewhere classified: Secondary | ICD-10-CM | POA: Diagnosis not present

## 2019-11-27 DIAGNOSIS — F329 Major depressive disorder, single episode, unspecified: Secondary | ICD-10-CM

## 2019-11-27 DIAGNOSIS — C7951 Secondary malignant neoplasm of bone: Secondary | ICD-10-CM | POA: Diagnosis not present

## 2019-11-27 DIAGNOSIS — D649 Anemia, unspecified: Secondary | ICD-10-CM | POA: Diagnosis not present

## 2019-11-27 DIAGNOSIS — R0602 Shortness of breath: Secondary | ICD-10-CM | POA: Diagnosis not present

## 2019-11-27 DIAGNOSIS — G893 Neoplasm related pain (acute) (chronic): Secondary | ICD-10-CM | POA: Diagnosis not present

## 2019-11-27 MED ORDER — SERTRALINE HCL 50 MG PO TABS: ORAL_TABLET | ORAL | 0 refills | Status: DC

## 2019-11-27 NOTE — Telephone Encounter (Signed)
Requested Prescriptions  Pending Prescriptions Disp Refills  . sertraline (ZOLOFT) 50 MG tablet 180 tablet 0    Sig: Two pills once per day at bedtime     Psychiatry:  Antidepressants - SSRI Passed - 11/27/2019  3:55 PM      Passed - Valid encounter within last 6 months    Recent Outpatient Visits          2 months ago Hypotension, unspecified hypotension type   [place], [place] J, NP   4 months ago Hypotension, unspecified hypotension type   [place], [place], MD   4 months ago Cellulitis of toe of right foot   [place] Fulp, [place], MD   5 months ago Malignant neoplasm of overlapping sites of right breast in female, estrogen receptor positive ([place])   [place] F[place], [place], MD   6 months ago Malignant neoplasm of overlapping sites of right breast in female, estrogen receptor positive ([place])   [facility] [place], [place], MD

## 2019-11-27 NOTE — Telephone Encounter (Signed)
Copied from [place] [id number]. Topic: Quick Communication - Rx Refill/Question >> Nov 27, 2019  3:50 PM Leward Quan A wrote: Medication: sertraline (ZOLOFT) 50 MG tablet    Has the patient contacted their pharmacy? Yes.   (Agent: If no, request that the patient contact the pharmacy for the refill.) (Agent: If yes, when and what did the pharmacy advise?)  Preferred Pharmacy (with phone number or street name): [facility], [place] - [address]  Phone:  [phone] Fax:  [phone]     Agent: Please be advised that RX refills may take up to 3 business days. We ask that you follow-up with your pharmacy.

## 2019-11-30 DIAGNOSIS — D649 Anemia, unspecified: Secondary | ICD-10-CM | POA: Diagnosis not present

## 2019-11-30 DIAGNOSIS — R0602 Shortness of breath: Secondary | ICD-10-CM | POA: Diagnosis not present

## 2019-11-30 DIAGNOSIS — C50811 Malignant neoplasm of overlapping sites of right female breast: Secondary | ICD-10-CM | POA: Diagnosis not present

## 2019-11-30 DIAGNOSIS — G893 Neoplasm related pain (acute) (chronic): Secondary | ICD-10-CM | POA: Diagnosis not present

## 2019-11-30 DIAGNOSIS — C7951 Secondary malignant neoplasm of bone: Secondary | ICD-10-CM | POA: Diagnosis not present

## 2019-11-30 DIAGNOSIS — C7801 Secondary malignant neoplasm of right lung: Secondary | ICD-10-CM | POA: Diagnosis not present

## 2019-11-30 DIAGNOSIS — J90 Pleural effusion, not elsewhere classified: Secondary | ICD-10-CM | POA: Diagnosis not present

## 2019-12-01 ENCOUNTER — Inpatient Hospital Stay: Payer: Medicaid Other | Attending: Adult Health

## 2019-12-01 ENCOUNTER — Other Ambulatory Visit: Payer: Self-pay | Admitting: *Deleted

## 2019-12-01 ENCOUNTER — Other Ambulatory Visit: Payer: Self-pay

## 2019-12-01 ENCOUNTER — Other Ambulatory Visit: Payer: Self-pay | Admitting: Oncology

## 2019-12-01 ENCOUNTER — Inpatient Hospital Stay: Payer: Medicaid Other

## 2019-12-01 VITALS — BP 168/58 | Temp 98.2°F | Resp 18

## 2019-12-01 DIAGNOSIS — C50911 Malignant neoplasm of unspecified site of right female breast: Secondary | ICD-10-CM | POA: Insufficient documentation

## 2019-12-01 DIAGNOSIS — C7951 Secondary malignant neoplasm of bone: Secondary | ICD-10-CM | POA: Insufficient documentation

## 2019-12-01 DIAGNOSIS — R0602 Shortness of breath: Secondary | ICD-10-CM

## 2019-12-01 DIAGNOSIS — C50811 Malignant neoplasm of overlapping sites of right female breast: Secondary | ICD-10-CM

## 2019-12-01 DIAGNOSIS — D649 Anemia, unspecified: Secondary | ICD-10-CM

## 2019-12-01 DIAGNOSIS — C7801 Secondary malignant neoplasm of right lung: Secondary | ICD-10-CM

## 2019-12-01 DIAGNOSIS — J90 Pleural effusion, not elsewhere classified: Secondary | ICD-10-CM

## 2019-12-01 DIAGNOSIS — G893 Neoplasm related pain (acute) (chronic): Secondary | ICD-10-CM

## 2019-12-01 LAB — CBC WITH DIFFERENTIAL/PLATELET
Abs Immature Granulocytes: 0.01 10*3/uL (ref 0.00–0.07)
Basophils Absolute: 0.1 10*3/uL (ref 0.0–0.1)
Basophils Relative: 2 %
Eosinophils Absolute: 0.1 10*3/uL (ref 0.0–0.5)
Eosinophils Relative: 2 %
HCT: 26.3 % — ABNORMAL LOW (ref 36.0–46.0)
Hemoglobin: 8.4 g/dL — ABNORMAL LOW (ref 12.0–15.0)
Immature Granulocytes: 0 %
Lymphocytes Relative: 36 %
Lymphs Abs: 1 10*3/uL (ref 0.7–4.0)
MCH: 36.5 pg — ABNORMAL HIGH (ref 26.0–34.0)
MCHC: 31.9 g/dL (ref 30.0–36.0)
MCV: 114.3 fL — ABNORMAL HIGH (ref 80.0–100.0)
Monocytes Absolute: 0.2 10*3/uL (ref 0.1–1.0)
Monocytes Relative: 8 %
Neutro Abs: 1.5 10*3/uL — ABNORMAL LOW (ref 1.7–7.7)
Neutrophils Relative %: 52 %
Platelets: 282 10*3/uL (ref 150–400)
RBC: 2.3 MIL/uL — ABNORMAL LOW (ref 3.87–5.11)
RDW: 15.4 % (ref 11.5–15.5)
WBC: 2.9 10*3/uL — ABNORMAL LOW (ref 4.0–10.5)
nRBC: 0 % (ref 0.0–0.2)

## 2019-12-01 LAB — COMPREHENSIVE METABOLIC PANEL
ALT: 10 U/L (ref 0–44)
AST: 15 U/L (ref 15–41)
Albumin: 3.5 g/dL (ref 3.5–5.0)
Alkaline Phosphatase: 100 U/L (ref 38–126)
Anion gap: 7 (ref 5–15)
BUN: 20 mg/dL (ref 6–20)
CO2: 32 mmol/L (ref 22–32)
Calcium: 9.5 mg/dL (ref 8.9–10.3)
Chloride: 101 mmol/L (ref 98–111)
Creatinine, Ser: 1.01 mg/dL — ABNORMAL HIGH (ref 0.44–1.00)
GFR calc Af Amer: >60 mL/min (ref >60)
GFR calc non Af Amer: >60 mL/min (ref >60)
Glucose, Bld: 89 mg/dL (ref 70–99)
Potassium: 4.2 mmol/L (ref 3.5–5.1)
Sodium: 140 mmol/L (ref 135–145)
Total Bilirubin: 0.3 mg/dL (ref 0.3–1.2)
Total Protein: 7 g/dL (ref 6.5–8.1)

## 2019-12-01 MED ORDER — KETOCONAZOLE 2 % EX CREA: TOPICAL_CREAM | CUTANEOUS | 1 refills | Status: DC

## 2019-12-01 MED ORDER — DENOSUMAB 120 MG/1.7ML ~~LOC~~ SOLN
120.0000 mg | Freq: Once | SUBCUTANEOUS | Status: AC
  Administered 2019-12-01: 120 mg via SUBCUTANEOUS

## 2019-12-01 MED ORDER — DENOSUMAB 120 MG/1.7ML ~~LOC~~ SOLN
SUBCUTANEOUS | Status: AC
  Filled 2019-12-01: qty 1.7

## 2019-12-01 NOTE — Patient Instructions (Signed)
Denosumab injection What is this medicine? DENOSUMAB (den oh sue mab) slows bone breakdown. Prolia is used to treat osteoporosis in women after menopause and in men, and in people who are taking corticosteroids for 6 months or more. Xgeva is used to treat a high calcium level due to cancer and to prevent bone fractures and other bone problems caused by multiple myeloma or cancer bone metastases. Xgeva is also used to treat giant cell tumor of the bone. This medicine may be used for other purposes; ask your health care provider or pharmacist if you have questions. COMMON BRAND NAME(S): Prolia, XGEVA What should I tell my health care provider before I take this medicine? They need to know if you have any of these conditions:  dental disease  having surgery or tooth extraction  infection  kidney disease  low levels of calcium or Vitamin D in the blood  malnutrition  on hemodialysis  skin conditions or sensitivity  thyroid or parathyroid disease  an unusual reaction to denosumab, other medicines, foods, dyes, or preservatives  pregnant or trying to get pregnant  breast-feeding How should I use this medicine? This medicine is for injection under the skin. It is given by a health care professional in a hospital or clinic setting. A special MedGuide will be given to you before each treatment. Be sure to read this information carefully each time. For Prolia, talk to your pediatrician regarding the use of this medicine in children. Special care may be needed. For Xgeva, talk to your pediatrician regarding the use of this medicine in children. While this drug may be prescribed for children as young as 13 years for selected conditions, precautions do apply. Overdosage: If you think you have taken too much of this medicine contact a poison control center or emergency room at once. NOTE: This medicine is only for you. Do not share this medicine with others. What if I miss a dose? It is  important not to miss your dose. Call your doctor or health care professional if you are unable to keep an appointment. What may interact with this medicine? Do not take this medicine with any of the following medications:  other medicines containing denosumab This medicine may also interact with the following medications:  medicines that lower your chance of fighting infection  steroid medicines like prednisone or cortisone This list may not describe all possible interactions. Give your health care provider a list of all the medicines, herbs, non-prescription drugs, or dietary supplements you use. Also tell them if you smoke, drink alcohol, or use illegal drugs. Some items may interact with your medicine. What should I watch for while using this medicine? Visit your doctor or health care professional for regular checks on your progress. Your doctor or health care professional may order blood tests and other tests to see how you are doing. Call your doctor or health care professional for advice if you get a fever, chills or sore throat, or other symptoms of a cold or flu. Do not treat yourself. This drug may decrease your body's ability to fight infection. Try to avoid being around people who are sick. You should make sure you get enough calcium and vitamin D while you are taking this medicine, unless your doctor tells you not to. Discuss the foods you eat and the vitamins you take with your health care professional. See your dentist regularly. Brush and floss your teeth as directed. Before you have any dental work done, tell your dentist you are   receiving this medicine. Do not become pregnant while taking this medicine or for 5 months after stopping it. Talk with your doctor or health care professional about your birth control options while taking this medicine. Women should inform their doctor if they wish to become pregnant or think they might be pregnant. There is a potential for serious side  effects to an unborn child. Talk to your health care professional or pharmacist for more information. What side effects may I notice from receiving this medicine? Side effects that you should report to your doctor or health care professional as soon as possible:  allergic reactions like skin rash, itching or hives, swelling of the face, lips, or tongue  bone pain  breathing problems  dizziness  jaw pain, especially after dental work  redness, blistering, peeling of the skin  signs and symptoms of infection like fever or chills; cough; sore throat; pain or trouble passing urine  signs of low calcium like fast heartbeat, muscle cramps or muscle pain; pain, tingling, numbness in the hands or feet; seizures  unusual bleeding or bruising  unusually weak or tired Side effects that usually do not require medical attention (report to your doctor or health care professional if they continue or are bothersome):  constipation  diarrhea  headache  joint pain  loss of appetite  muscle pain  runny nose  tiredness  upset stomach This list may not describe all possible side effects. Call your doctor for medical advice about side effects. You may report side effects to FDA at 1-800-FDA-1088. Where should I keep my medicine? This medicine is only given in a clinic, doctor's office, or other health care setting and will not be stored at home. NOTE: This sheet is a summary. It may not cover all possible information. If you have questions about this medicine, talk to your doctor, pharmacist, or health care provider.  2020 Elsevier/Gold Standard (2017-09-20 16:10:44)

## 2019-12-02 DIAGNOSIS — C7951 Secondary malignant neoplasm of bone: Secondary | ICD-10-CM | POA: Diagnosis not present

## 2019-12-02 DIAGNOSIS — C50811 Malignant neoplasm of overlapping sites of right female breast: Secondary | ICD-10-CM | POA: Diagnosis not present

## 2019-12-02 DIAGNOSIS — J90 Pleural effusion, not elsewhere classified: Secondary | ICD-10-CM | POA: Diagnosis not present

## 2019-12-02 DIAGNOSIS — R0602 Shortness of breath: Secondary | ICD-10-CM | POA: Diagnosis not present

## 2019-12-02 DIAGNOSIS — G893 Neoplasm related pain (acute) (chronic): Secondary | ICD-10-CM | POA: Diagnosis not present

## 2019-12-02 DIAGNOSIS — C7801 Secondary malignant neoplasm of right lung: Secondary | ICD-10-CM | POA: Diagnosis not present

## 2019-12-02 DIAGNOSIS — D649 Anemia, unspecified: Secondary | ICD-10-CM | POA: Diagnosis not present

## 2019-12-02 LAB — CANCER ANTIGEN 27.29: CA 27.29: 34.1 U/mL (ref 0.0–38.6)

## 2019-12-02 NOTE — Telephone Encounter (Signed)
error 

## 2019-12-03 DIAGNOSIS — G893 Neoplasm related pain (acute) (chronic): Secondary | ICD-10-CM | POA: Diagnosis not present

## 2019-12-03 DIAGNOSIS — J90 Pleural effusion, not elsewhere classified: Secondary | ICD-10-CM | POA: Diagnosis not present

## 2019-12-03 DIAGNOSIS — R0602 Shortness of breath: Secondary | ICD-10-CM | POA: Diagnosis not present

## 2019-12-03 DIAGNOSIS — C7951 Secondary malignant neoplasm of bone: Secondary | ICD-10-CM | POA: Diagnosis not present

## 2019-12-03 DIAGNOSIS — C7801 Secondary malignant neoplasm of right lung: Secondary | ICD-10-CM | POA: Diagnosis not present

## 2019-12-03 DIAGNOSIS — C50811 Malignant neoplasm of overlapping sites of right female breast: Secondary | ICD-10-CM | POA: Diagnosis not present

## 2019-12-03 DIAGNOSIS — D649 Anemia, unspecified: Secondary | ICD-10-CM | POA: Diagnosis not present

## 2019-12-04 DIAGNOSIS — G893 Neoplasm related pain (acute) (chronic): Secondary | ICD-10-CM | POA: Diagnosis not present

## 2019-12-04 DIAGNOSIS — D649 Anemia, unspecified: Secondary | ICD-10-CM | POA: Diagnosis not present

## 2019-12-04 DIAGNOSIS — R0602 Shortness of breath: Secondary | ICD-10-CM | POA: Diagnosis not present

## 2019-12-04 DIAGNOSIS — J90 Pleural effusion, not elsewhere classified: Secondary | ICD-10-CM | POA: Diagnosis not present

## 2019-12-04 DIAGNOSIS — C50811 Malignant neoplasm of overlapping sites of right female breast: Secondary | ICD-10-CM | POA: Diagnosis not present

## 2019-12-04 DIAGNOSIS — C7951 Secondary malignant neoplasm of bone: Secondary | ICD-10-CM | POA: Diagnosis not present

## 2019-12-04 DIAGNOSIS — C7801 Secondary malignant neoplasm of right lung: Secondary | ICD-10-CM | POA: Diagnosis not present

## 2019-12-07 DIAGNOSIS — D649 Anemia, unspecified: Secondary | ICD-10-CM | POA: Diagnosis not present

## 2019-12-07 DIAGNOSIS — C50811 Malignant neoplasm of overlapping sites of right female breast: Secondary | ICD-10-CM | POA: Diagnosis not present

## 2019-12-07 DIAGNOSIS — J90 Pleural effusion, not elsewhere classified: Secondary | ICD-10-CM | POA: Diagnosis not present

## 2019-12-07 DIAGNOSIS — C7801 Secondary malignant neoplasm of right lung: Secondary | ICD-10-CM | POA: Diagnosis not present

## 2019-12-07 DIAGNOSIS — G893 Neoplasm related pain (acute) (chronic): Secondary | ICD-10-CM | POA: Diagnosis not present

## 2019-12-07 DIAGNOSIS — C7951 Secondary malignant neoplasm of bone: Secondary | ICD-10-CM | POA: Diagnosis not present

## 2019-12-07 DIAGNOSIS — R0602 Shortness of breath: Secondary | ICD-10-CM | POA: Diagnosis not present

## 2019-12-08 DIAGNOSIS — C7951 Secondary malignant neoplasm of bone: Secondary | ICD-10-CM | POA: Diagnosis not present

## 2019-12-08 DIAGNOSIS — J90 Pleural effusion, not elsewhere classified: Secondary | ICD-10-CM | POA: Diagnosis not present

## 2019-12-08 DIAGNOSIS — D649 Anemia, unspecified: Secondary | ICD-10-CM | POA: Diagnosis not present

## 2019-12-08 DIAGNOSIS — G893 Neoplasm related pain (acute) (chronic): Secondary | ICD-10-CM | POA: Diagnosis not present

## 2019-12-08 DIAGNOSIS — C50811 Malignant neoplasm of overlapping sites of right female breast: Secondary | ICD-10-CM | POA: Diagnosis not present

## 2019-12-08 DIAGNOSIS — R0602 Shortness of breath: Secondary | ICD-10-CM | POA: Diagnosis not present

## 2019-12-08 DIAGNOSIS — C7801 Secondary malignant neoplasm of right lung: Secondary | ICD-10-CM | POA: Diagnosis not present

## 2019-12-09 ENCOUNTER — Telehealth: Payer: Self-pay

## 2019-12-09 NOTE — Telephone Encounter (Signed)
Oral Oncology Patient Advocate Encounter  Received notification from [employer] that prior authorization for Suzanne [name] is required.  PA submitted on CoverMyMeds Key [id number] Status is pending  Oral Oncology Clinic will continue to follow.  [place] Patient [place] Phone [phone] Fax [phone] 12/09/2019 1:33 PM

## 2019-12-10 DIAGNOSIS — D649 Anemia, unspecified: Secondary | ICD-10-CM | POA: Diagnosis not present

## 2019-12-10 DIAGNOSIS — C7801 Secondary malignant neoplasm of right lung: Secondary | ICD-10-CM | POA: Diagnosis not present

## 2019-12-10 DIAGNOSIS — G893 Neoplasm related pain (acute) (chronic): Secondary | ICD-10-CM | POA: Diagnosis not present

## 2019-12-10 DIAGNOSIS — J90 Pleural effusion, not elsewhere classified: Secondary | ICD-10-CM | POA: Diagnosis not present

## 2019-12-10 DIAGNOSIS — C7951 Secondary malignant neoplasm of bone: Secondary | ICD-10-CM | POA: Diagnosis not present

## 2019-12-10 DIAGNOSIS — C50811 Malignant neoplasm of overlapping sites of right female breast: Secondary | ICD-10-CM | POA: Diagnosis not present

## 2019-12-10 DIAGNOSIS — R0602 Shortness of breath: Secondary | ICD-10-CM | POA: Diagnosis not present

## 2019-12-10 NOTE — Telephone Encounter (Signed)
Oral Oncology Patient Advocate Encounter  Prior Authorization for Suzanne [name] has been approved.    PA# [id number] Effective dates: 12/09/19 through 12/08/20  Patients co-pay is $3  Oral Oncology Clinic will continue to follow.   [name] Patient [place] Phone [phone] Fax [phone] 12/10/2019 8:14 AM

## 2019-12-11 DIAGNOSIS — C50811 Malignant neoplasm of overlapping sites of right female breast: Secondary | ICD-10-CM | POA: Diagnosis not present

## 2019-12-11 DIAGNOSIS — C7951 Secondary malignant neoplasm of bone: Secondary | ICD-10-CM | POA: Diagnosis not present

## 2019-12-11 DIAGNOSIS — G893 Neoplasm related pain (acute) (chronic): Secondary | ICD-10-CM | POA: Diagnosis not present

## 2019-12-11 DIAGNOSIS — R0602 Shortness of breath: Secondary | ICD-10-CM | POA: Diagnosis not present

## 2019-12-11 DIAGNOSIS — J90 Pleural effusion, not elsewhere classified: Secondary | ICD-10-CM | POA: Diagnosis not present

## 2019-12-11 DIAGNOSIS — C7801 Secondary malignant neoplasm of right lung: Secondary | ICD-10-CM | POA: Diagnosis not present

## 2019-12-11 DIAGNOSIS — D649 Anemia, unspecified: Secondary | ICD-10-CM | POA: Diagnosis not present

## 2019-12-14 DIAGNOSIS — C50811 Malignant neoplasm of overlapping sites of right female breast: Secondary | ICD-10-CM | POA: Diagnosis not present

## 2019-12-14 DIAGNOSIS — C7951 Secondary malignant neoplasm of bone: Secondary | ICD-10-CM | POA: Diagnosis not present

## 2019-12-14 DIAGNOSIS — G893 Neoplasm related pain (acute) (chronic): Secondary | ICD-10-CM | POA: Diagnosis not present

## 2019-12-14 DIAGNOSIS — C7801 Secondary malignant neoplasm of right lung: Secondary | ICD-10-CM | POA: Diagnosis not present

## 2019-12-14 DIAGNOSIS — D649 Anemia, unspecified: Secondary | ICD-10-CM | POA: Diagnosis not present

## 2019-12-14 DIAGNOSIS — J90 Pleural effusion, not elsewhere classified: Secondary | ICD-10-CM | POA: Diagnosis not present

## 2019-12-14 DIAGNOSIS — R0602 Shortness of breath: Secondary | ICD-10-CM | POA: Diagnosis not present

## 2019-12-14 MED FILL — IBRANCE 100 MG TABS: 100 | 28 days supply | Qty: 21 | Fill #1

## 2019-12-15 DIAGNOSIS — D649 Anemia, unspecified: Secondary | ICD-10-CM | POA: Diagnosis not present

## 2019-12-15 DIAGNOSIS — C7801 Secondary malignant neoplasm of right lung: Secondary | ICD-10-CM | POA: Diagnosis not present

## 2019-12-15 DIAGNOSIS — J90 Pleural effusion, not elsewhere classified: Secondary | ICD-10-CM | POA: Diagnosis not present

## 2019-12-15 DIAGNOSIS — C50811 Malignant neoplasm of overlapping sites of right female breast: Secondary | ICD-10-CM | POA: Diagnosis not present

## 2019-12-15 DIAGNOSIS — C7951 Secondary malignant neoplasm of bone: Secondary | ICD-10-CM | POA: Diagnosis not present

## 2019-12-15 DIAGNOSIS — R0602 Shortness of breath: Secondary | ICD-10-CM | POA: Diagnosis not present

## 2019-12-15 DIAGNOSIS — G893 Neoplasm related pain (acute) (chronic): Secondary | ICD-10-CM | POA: Diagnosis not present

## 2019-12-16 DIAGNOSIS — R0602 Shortness of breath: Secondary | ICD-10-CM | POA: Diagnosis not present

## 2019-12-16 DIAGNOSIS — J90 Pleural effusion, not elsewhere classified: Secondary | ICD-10-CM | POA: Diagnosis not present

## 2019-12-16 DIAGNOSIS — D649 Anemia, unspecified: Secondary | ICD-10-CM | POA: Diagnosis not present

## 2019-12-16 DIAGNOSIS — C50811 Malignant neoplasm of overlapping sites of right female breast: Secondary | ICD-10-CM | POA: Diagnosis not present

## 2019-12-16 DIAGNOSIS — G893 Neoplasm related pain (acute) (chronic): Secondary | ICD-10-CM | POA: Diagnosis not present

## 2019-12-16 DIAGNOSIS — C7951 Secondary malignant neoplasm of bone: Secondary | ICD-10-CM | POA: Diagnosis not present

## 2019-12-16 DIAGNOSIS — C7801 Secondary malignant neoplasm of right lung: Secondary | ICD-10-CM | POA: Diagnosis not present

## 2019-12-17 DIAGNOSIS — R0602 Shortness of breath: Secondary | ICD-10-CM | POA: Diagnosis not present

## 2019-12-17 DIAGNOSIS — C7801 Secondary malignant neoplasm of right lung: Secondary | ICD-10-CM | POA: Diagnosis not present

## 2019-12-17 DIAGNOSIS — G893 Neoplasm related pain (acute) (chronic): Secondary | ICD-10-CM | POA: Diagnosis not present

## 2019-12-17 DIAGNOSIS — C50811 Malignant neoplasm of overlapping sites of right female breast: Secondary | ICD-10-CM | POA: Diagnosis not present

## 2019-12-17 DIAGNOSIS — D649 Anemia, unspecified: Secondary | ICD-10-CM | POA: Diagnosis not present

## 2019-12-17 DIAGNOSIS — J90 Pleural effusion, not elsewhere classified: Secondary | ICD-10-CM | POA: Diagnosis not present

## 2019-12-17 DIAGNOSIS — C7951 Secondary malignant neoplasm of bone: Secondary | ICD-10-CM | POA: Diagnosis not present

## 2019-12-18 DIAGNOSIS — C7951 Secondary malignant neoplasm of bone: Secondary | ICD-10-CM | POA: Diagnosis not present

## 2019-12-18 DIAGNOSIS — C50811 Malignant neoplasm of overlapping sites of right female breast: Secondary | ICD-10-CM | POA: Diagnosis not present

## 2019-12-18 DIAGNOSIS — D649 Anemia, unspecified: Secondary | ICD-10-CM | POA: Diagnosis not present

## 2019-12-18 DIAGNOSIS — G893 Neoplasm related pain (acute) (chronic): Secondary | ICD-10-CM | POA: Diagnosis not present

## 2019-12-18 DIAGNOSIS — R0602 Shortness of breath: Secondary | ICD-10-CM | POA: Diagnosis not present

## 2019-12-18 DIAGNOSIS — C7801 Secondary malignant neoplasm of right lung: Secondary | ICD-10-CM | POA: Diagnosis not present

## 2019-12-18 DIAGNOSIS — J90 Pleural effusion, not elsewhere classified: Secondary | ICD-10-CM | POA: Diagnosis not present

## 2019-12-21 DIAGNOSIS — R0602 Shortness of breath: Secondary | ICD-10-CM | POA: Diagnosis not present

## 2019-12-21 DIAGNOSIS — C50811 Malignant neoplasm of overlapping sites of right female breast: Secondary | ICD-10-CM | POA: Diagnosis not present

## 2019-12-21 DIAGNOSIS — D649 Anemia, unspecified: Secondary | ICD-10-CM | POA: Diagnosis not present

## 2019-12-21 DIAGNOSIS — G893 Neoplasm related pain (acute) (chronic): Secondary | ICD-10-CM | POA: Diagnosis not present

## 2019-12-21 DIAGNOSIS — C7951 Secondary malignant neoplasm of bone: Secondary | ICD-10-CM | POA: Diagnosis not present

## 2019-12-21 DIAGNOSIS — C7801 Secondary malignant neoplasm of right lung: Secondary | ICD-10-CM | POA: Diagnosis not present

## 2019-12-21 DIAGNOSIS — J90 Pleural effusion, not elsewhere classified: Secondary | ICD-10-CM | POA: Diagnosis not present

## 2019-12-22 DIAGNOSIS — R0602 Shortness of breath: Secondary | ICD-10-CM | POA: Diagnosis not present

## 2019-12-22 DIAGNOSIS — C7951 Secondary malignant neoplasm of bone: Secondary | ICD-10-CM | POA: Diagnosis not present

## 2019-12-22 DIAGNOSIS — J90 Pleural effusion, not elsewhere classified: Secondary | ICD-10-CM | POA: Diagnosis not present

## 2019-12-22 DIAGNOSIS — C50811 Malignant neoplasm of overlapping sites of right female breast: Secondary | ICD-10-CM | POA: Diagnosis not present

## 2019-12-22 DIAGNOSIS — C7801 Secondary malignant neoplasm of right lung: Secondary | ICD-10-CM | POA: Diagnosis not present

## 2019-12-22 DIAGNOSIS — D649 Anemia, unspecified: Secondary | ICD-10-CM | POA: Diagnosis not present

## 2019-12-22 DIAGNOSIS — G893 Neoplasm related pain (acute) (chronic): Secondary | ICD-10-CM | POA: Diagnosis not present

## 2019-12-23 DIAGNOSIS — R0602 Shortness of breath: Secondary | ICD-10-CM | POA: Diagnosis not present

## 2019-12-23 DIAGNOSIS — D649 Anemia, unspecified: Secondary | ICD-10-CM | POA: Diagnosis not present

## 2019-12-23 DIAGNOSIS — C50811 Malignant neoplasm of overlapping sites of right female breast: Secondary | ICD-10-CM | POA: Diagnosis not present

## 2019-12-23 DIAGNOSIS — C7951 Secondary malignant neoplasm of bone: Secondary | ICD-10-CM | POA: Diagnosis not present

## 2019-12-23 DIAGNOSIS — G893 Neoplasm related pain (acute) (chronic): Secondary | ICD-10-CM | POA: Diagnosis not present

## 2019-12-23 DIAGNOSIS — C7801 Secondary malignant neoplasm of right lung: Secondary | ICD-10-CM | POA: Diagnosis not present

## 2019-12-23 DIAGNOSIS — J90 Pleural effusion, not elsewhere classified: Secondary | ICD-10-CM | POA: Diagnosis not present

## 2019-12-24 ENCOUNTER — Ambulatory Visit (HOSPITAL_COMMUNITY)
Admission: RE | Admit: 2019-12-24 | Discharge: 2019-12-24 | Disposition: A | Discharge status: Home / Self Care | Payer: Medicaid Other | Source: Ambulatory Visit | Attending: Oncology | Admitting: Oncology

## 2019-12-24 ENCOUNTER — Encounter (HOSPITAL_COMMUNITY): Payer: Self-pay

## 2019-12-24 ENCOUNTER — Other Ambulatory Visit: Payer: Self-pay

## 2019-12-24 ENCOUNTER — Telehealth: Payer: Self-pay

## 2019-12-24 DIAGNOSIS — C50811 Malignant neoplasm of overlapping sites of right female breast: Secondary | ICD-10-CM

## 2019-12-24 DIAGNOSIS — R0602 Shortness of breath: Secondary | ICD-10-CM | POA: Diagnosis not present

## 2019-12-24 DIAGNOSIS — J90 Pleural effusion, not elsewhere classified: Secondary | ICD-10-CM | POA: Diagnosis not present

## 2019-12-24 DIAGNOSIS — C7800 Secondary malignant neoplasm of unspecified lung: Secondary | ICD-10-CM

## 2019-12-24 DIAGNOSIS — Z170 Estrogen receptor positive status [ER+]: Secondary | ICD-10-CM | POA: Insufficient documentation

## 2019-12-24 DIAGNOSIS — C7951 Secondary malignant neoplasm of bone: Secondary | ICD-10-CM

## 2019-12-24 DIAGNOSIS — I2510 Atherosclerotic heart disease of native coronary artery without angina pectoris: Secondary | ICD-10-CM | POA: Diagnosis not present

## 2019-12-24 DIAGNOSIS — G893 Neoplasm related pain (acute) (chronic): Secondary | ICD-10-CM | POA: Diagnosis not present

## 2019-12-24 DIAGNOSIS — C50911 Malignant neoplasm of unspecified site of right female breast: Secondary | ICD-10-CM | POA: Insufficient documentation

## 2019-12-24 DIAGNOSIS — C7801 Secondary malignant neoplasm of right lung: Secondary | ICD-10-CM | POA: Diagnosis not present

## 2019-12-24 DIAGNOSIS — D649 Anemia, unspecified: Secondary | ICD-10-CM | POA: Diagnosis not present

## 2019-12-24 DIAGNOSIS — C50919 Malignant neoplasm of unspecified site of unspecified female breast: Secondary | ICD-10-CM | POA: Diagnosis not present

## 2019-12-24 MED ORDER — IOHEXOL 300 MG/ML  SOLN
75.0000 mL | Freq: Once | INTRAMUSCULAR | Status: AC | PRN
  Administered 2019-12-24: 75 mL via INTRAVENOUS

## 2019-12-24 MED ORDER — SODIUM CHLORIDE (PF) 0.9 % IJ SOLN
INTRAMUSCULAR | Status: AC
  Filled 2019-12-24: qty 50

## 2019-12-24 NOTE — Telephone Encounter (Signed)
Call placed to patient and informed her that the SCAT application was received.  Dr Chapman Fitch is out of the office and this CM asked for authorization to complete the application for Dr Chapman Fitch, and the patient provided the authorization.  Application completed and faxed to [employer] [place] eligibility.  This is a re-certification for the patient.

## 2019-12-25 DIAGNOSIS — J90 Pleural effusion, not elsewhere classified: Secondary | ICD-10-CM | POA: Diagnosis not present

## 2019-12-25 DIAGNOSIS — D649 Anemia, unspecified: Secondary | ICD-10-CM | POA: Diagnosis not present

## 2019-12-25 DIAGNOSIS — R0602 Shortness of breath: Secondary | ICD-10-CM | POA: Diagnosis not present

## 2019-12-25 DIAGNOSIS — C50811 Malignant neoplasm of overlapping sites of right female breast: Secondary | ICD-10-CM | POA: Diagnosis not present

## 2019-12-25 DIAGNOSIS — C7951 Secondary malignant neoplasm of bone: Secondary | ICD-10-CM | POA: Diagnosis not present

## 2019-12-25 DIAGNOSIS — G893 Neoplasm related pain (acute) (chronic): Secondary | ICD-10-CM | POA: Diagnosis not present

## 2019-12-25 DIAGNOSIS — C7801 Secondary malignant neoplasm of right lung: Secondary | ICD-10-CM | POA: Diagnosis not present

## 2019-12-28 NOTE — Progress Notes (Signed)
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  Since her last visit, she underwent restaging chest CT on 12/24/2019 showing: no significant change in 1.6 cm left axillary lymph node, no other enlarged lymph nodes in chest; diffuse lytic and sclerotic osseous metastatic disease throughout all included osseous structures, including high-grade wedge deformity of T12; no new evidence of metastatic disease in chest; bandlike scarring of right lung; moderate hiatal hernia with intrathoracic position of gastric fundus.  [name] continues on anastrozole.  She tolerates it well.  She has hot flashes may be once or twice a day and then many days she does not have any.  They do not tend to occur at night and they do not wake her up at night.  She is also on palbociclib stably at 100 mg daily 21 days on 7 days off.  She denies unusual fatigue or nausea associated with the medication.  Her counts have been holding steady.  She receives [place] every 4 weeks and her last dose was on 09/08/2019.  She has no bone pain or problems with her teeth or gums related to this.     REVIEW OF SYSTEMS: Suzanne [name] continues to very slowly but there is steadily improved.  She is walking some in the house, usually not using a cane or walker although when she goes outside to walk, which is seldom, she does prefer the walker.  She is alert to the danger of falls.  She received the  Pfizer vaccines without complication.  She has been having occasional nausea and rare vomiting which she uses Zofran for.  She has no cough at present.  She was extremely excited when her tumor marker finally reached normal.  A detailed review of systems today was otherwise stable   HISTORY OF CURRENT ILLNESS: From the original inpatient consult note:  Suzanne [name] is a 59 year old female from [place], [place] without significant past medical history with exception of obesity, remote history of gallstones, and remote history of mastitis.  The patient presented to the hospital on 10/26/2018 with a 3-week history of worsening shortness of breath and lower extremity edema.  The patient reported that her shortness of breath worsened with exertion.  Her lower extremity edema did not improve with elevation.  She also had an intermittent pr oductive cough with rare sputum production.  The patient was noted to be anemic with a hemoglobin of 4.5 with a clumped platelets noted on admission.  Stool for occult blood was positive.  Patient received 2 units of packed red blood cells.    The patient reported a rash under her breast x1 week which has been foul-smelling.  She also reported that she has some discomfort and thickening/hardness of her right breast for several months.  She states that her right breast is much larger than her left.  She notes some occasional bloody discharge from her breast once in a while.  A chest x-ray on admission showed diffuse bilateral interstitial opacity of unknown chronicity, findings could be related to interstitial  inflammatory process, atypical/viral pneumonia, or possible metastatic disease.  There are also sclerotic and lytic lesions within the clavicles, bilateral ribs, shoulders which was concerning for diffuse few skeletal metastatic disease.    This prompted a CT scan of the chest with contrast which showed bilateral axillary and mediastinal adenopathy, borderline  hilar lymph nodes bilaterally, findings likely flecked metastatic lymphadenopathy, moderate bilateral pleural effusions, extensive interstitial thickening and reticulonodular opacities throughout the lungs, cannot exclude lymphangitic spread of tumor, diffuse skeletal metastases.  The patient's subsequent history is as detailed below.   PAST MEDICAL HISTORY: Past Medical History:  Diagnosis Date   Cancer [facility])    Family history of breast cancer    Family history of colon cancer    Family history of melanoma    Family history of prostate cancer    Gallstones    Glaucoma    Mastitis    reports history of recurrent mastitis   Metastatic breast cancer ([place])    Obesity     PAST SURGICAL HISTORY: Past Surgical History:  Procedure Laterality Date   GLAUCOMA SURGERY  late 1990's   IR THORACENTESIS ASP PLEURAL SPACE W/IMG GUIDE  10/28/2018   IR THORACENTESIS ASP PLEURAL SPACE W/IMG GUIDE  10/29/2018   OPEN REDUCTION INTERNAL FIXATION (ORIF) DISTAL RADIAL FRACTURE Left 01/09/2019   Procedure: OPEN REDUCTION INTERNAL FIXATION (ORIF) HUMERAL FRACTURE;  Surgeon: Suzanne Cedars, MD;  Location: [facility];  Service: Orthopedics;  Laterality: Left;    FAMILY HISTORY: Family History  Problem Relation Age of Onset   Breast cancer Sister        in her 65's-70s   Heart disease Brother        CABG   Heart disease Brother        CABG   Skin cancer Brother        melanoma   Colon cancer Cousin    Heart attack Mother    Heart attack Father    Prostate cancer Brother   Patient's father passed away at age 91, and her mother at 54, both from heart attacks. The patient has 8 siblings, 6 brothers and 1 sister.  Her sister was diagnosed with breast cancer. One brother has prostate cancer, another brother had a melanoma removed.    GYNECOLOGIC HISTORY:  Menarche: 59 years old Age at first live birth: 59 years old [place] P 1 LMP age 38 Contraceptive: used for 2-3 years with no  problems HRT never used  Hysterectomy? no   SOCIAL HISTORY:  Suzanne [name] worked for an Engineer, manufacturing systems. She is widowed. She lives at home with her daughter.  Suzanne [name], 37, who is a Psychologist, occupational at [employer] center.  The patient has no grandchildren. She is not a Ambulance person.   ADVANCED DIRECTIVES: not on file; she has the paperwork already. She intends to name her daughter as her HCPOA.   HEALTH MAINTENANCE: Social History   Tobacco Use   Smoking status: Never Smoker   Smokeless tobacco: Never Used   Tobacco comment: second hand smoke exposure  Vaping Use   Vaping Use: Never used  Substance Use Topics   Alcohol use: No   Drug use: No    Colonoscopy:   PAP:   Bone density:  Mammography:   No Known Allergies  Current Outpatient Medications  Medication Sig Dispense Refill   albuterol (VENTOLIN HFA) 108 (90 Base) MCG/ACT inhaler Inhale 2 puffs into the lungs every 6 (six) hours as needed for wheezing or shortness of breath. [place]  g 3   ALPRAZolam (XANAX) 0.25 MG tablet Take 1-2 tablets (0.25-0.5 mg total) by mouth every 8 (eight) hours as needed for anxiety. Prescription needed for SNF placement 5 tablet 0   anastrozole (ARIMIDEX) 1 MG tablet Take 1 tablet (1 mg total) by mouth daily. 30 tablet 3   antiseptic oral rinse (BIOTENE) LIQD 15 mLs by Mouth Rinse route as needed for dry mouth.     benzonatate (TESSALON) 200 MG capsule TAKE 1 CAPSULE BY MOUTH 3 TIMES DAILY IF NEEDED 30 capsule 0   Blood Pressure Monitor MISC Use to check blood pressure twice per day and more often if not feeling well 1 each 0   calcium-vitamin D (OSCAL WITH D) 500-200 MG-UNIT tablet Take 1 tablet by mouth 3 (three) times daily. 90 tablet 1   carvedilol (COREG) 3.125 MG tablet Take 1 tablet (3.125 mg total) by mouth 2 (two) times daily with a meal. 60 tablet 0   cephALEXin (KEFLEX) 500 MG capsule Take 1 capsule (500 mg total) by mouth 2 (two) times daily. 10 capsule 0   fluconazole  (DIFLUCAN) 100 MG tablet Take 1 tablet (100 mg total) by mouth daily. Take medication for 10 days and then take only as needed. 90 tablet 3   furosemide (LASIX) 20 MG tablet Take 1 tablet (20 mg total) by mouth daily. 90 tablet 4   ketoconazole (NIZORAL) 2 % cream APPLY TOPICALLY TO THE AFFECTED AREA DAILY 30 g 1   loratadine (CLARITIN) 10 MG tablet Take 1 tablet (10 mg total) by mouth daily. 30 tablet 3   nystatin (MYCOSTATIN/NYSTOP) powder Apply topically 3 (three) times daily. Apply under breast area (Patient not taking: Reported on 09/18/2019) 15 g 1   palbociclib (IBRANCE) 100 MG tablet Take 1 tablet (100 mg total) by mouth daily. Take for 21 days on, 7 days off, repeat every 28 days. 21 tablet 1   polyethylene glycol (MIRALAX / GLYCOLAX) 17 g packet Take 17 g by mouth as needed for constipation.     senna (SENOKOT) 8.6 MG TABS tablet Take 1 tablet (8.6 mg total) by mouth 2 (two) times daily. (Patient not taking: Reported on 09/18/2019) 120 tablet 0   sertraline (ZOLOFT) 50 MG tablet Two pills once per day at bedtime 180 tablet 0   ZOFRAN 8 MG tablet Take 8 mg by mouth every 8 (eight) hours.     No current facility-administered medications for this visit.    OBJECTIVE: white woman examined in a wheelchair  Vitals:   12/29/19 1450  BP: (!) 147/86  Pulse: 77  Resp: 18  Temp: 98.3 F (36.8 C)  SpO2: 100%     Body mass index is 38.6 kg/m.   Wt Readings from Last 3 Encounters:  12/29/19 191 lb 1.6 oz (86.7 kg)  10/06/19 182 lb 6.4 oz (82.7 kg)  07/14/19 173 lb 4.8 oz (78.6 kg)  ECOG FS:3  Sclerae unicteric, EOMs intact Wearing a mask No cervical or supraclavicular adenopathy Lungs no rales or rhonchi Heart regular rate and rhythm Abd soft, nontender, positive bowel sounds MSK no focal spinal tenderness, no upper extremity lymphedema Neuro: nonfocal, well oriented, appropriate affect Breasts: The right breast continues to feel slightly firmer distally, without a palpable  mass, without erythema, and without nipple retraction.  The left breast is benign.  Both axillae are benign. Skin: There are confluent areas of erythema in the inframammary fold bilaterally.  LAB RESULTS:  CMP     Component Value Date/Time  NA 138 12/29/2019 1436   K 4.2 12/29/2019 1436   CL 102 12/29/2019 1436   CO2 29 12/29/2019 1436   GLUCOSE 85 12/29/2019 1436   BUN 22 (H) 12/29/2019 1436   CREATININE 0.94 12/29/2019 1436   CREATININE 0.78 07/24/2019 1453   CALCIUM 10.0 12/29/2019 1436   PROT 7.1 12/29/2019 1436   ALBUMIN 3.7 12/29/2019 1436   AST 16 12/29/2019 1436   AST 14 (L) 07/24/2019 1453   ALT 9 12/29/2019 1436   ALT 8 07/24/2019 1453   ALKPHOS 107 12/29/2019 1436   BILITOT 0.4 12/29/2019 1436   BILITOT 0.3 07/24/2019 1453   GFRNONAA >60 12/29/2019 1436   GFRNONAA >60 07/24/2019 1453   GFRAA >60 12/29/2019 1436   GFRAA >60 07/24/2019 1453    No results found for: TOTALPROTELP, ALBUMINELP, A1GS, A2GS, BETS, BETA2SER, GAMS, MSPIKE, SPEI  No results found for: KPAFRELGTCHN, LAMBDASER, KAPLAMBRATIO  Lab Results  Component Value Date   WBC 2.9 (L) 12/29/2019   NEUTROABS 1.6 (L) 12/29/2019   HGB 9.3 (L) 12/29/2019   HCT 29.4 (L) 12/29/2019   MCV 112.2 (H) 12/29/2019   PLT 305 12/29/2019   No results found for: LABCA2  No components found for: LKGMWN027  No results for input(s): INR in the last 168 hours.  No results found for: LABCA2  No results found for: CAN199  No results found for: CAN125  No results found for: OZD664  Lab Results  Component Value Date   CA2729 34.1 12/01/2019    No components found for: HGQUANT  No results found for: CEA1 / No results found for: CEA1   No results found for: AFPTUMOR  No results found for: CHROMOGRNA  No results found for: HGBA, HGBA2QUANT, HGBFQUANT, HGBSQUAN (Hemoglobinopathy evaluation)   Lab Results  Component Value Date   LDH 244 (H) 12/25/2018    Lab Results  Component Value Date   IRON  137 10/26/2018   TIBC 384 10/26/2018   IRONPCTSAT 36 (H) 10/26/2018   (Iron and TIBC)  Lab Results  Component Value Date   FERRITIN 2,306 (H) 12/25/2018    Urinalysis    Component Value Date/Time   COLORURINE STRAW (A) 01/15/2019 1150   APPEARANCEUR CLEAR 01/15/2019 1150   LABSPEC 1.008 01/15/2019 1150   PHURINE 6.0 01/15/2019 1150   GLUCOSEU NEGATIVE 01/15/2019 [place] 01/15/2019 1150   BILIRUBINUR NEGATIVE 01/15/2019 1150   KETONESUR NEGATIVE 01/15/2019 1150   PROTEINUR NEGATIVE 01/15/2019 1150   NITRITE NEGATIVE 01/15/2019 1150   LEUKOCYTESUR TRACE (A) 01/15/2019 1150    STUDIES:  CT Chest W Contrast  Result Date: 12/24/2019 CLINICAL DATA:  Breast cancer restaging EXAM: CT CHEST WITH CONTRAST TECHNIQUE: Multidetector CT imaging of the chest was performed during intravenous contrast administration. CONTRAST:  38m OMNIPAQUE IOHEXOL 300 MG/ML  SOLN COMPARISON:  07/14/2019 FINDINGS: Cardiovascular: Aortic atherosclerosis. Normal heart size. Three-vessel coronary artery calcifications. No pericardial effusion. Mediastinum/Nodes: No significant interval change in a left axillary lymph node, measuring 1.6 x 1.6 cm (series 2, image 43). No other enlarged lymph nodes in the chest. Moderate hiatal hernia with intrathoracic position of the gastric fundus. Thyroid gland, trachea, and esophagus demonstrate no significant findings. Lungs/Pleura: Bandlike scarring of the right lung, with volume loss and elevation of the right hemidiaphragm. Trace pleural effusions, unchanged. Upper Abdomen: No acute abnormality.  Gallstones. Musculoskeletal: Diffuse, lytic and sclerotic osseous metastatic disease throughout all included osseous structures, including a high-grade wedge deformity of the T12 vertebral body and associated  focal kyphosis. Partially healed pathologic fracture of the head of the right clavicle. Partially imaged plate and screw fixation of the proximal left humerus.  IMPRESSION: 1. No significant interval change in a left axillary lymph node, measuring 1.6 x 1.6 cm. No other enlarged lymph nodes in the chest. 2. Diffuse, lytic and sclerotic osseous metastatic disease throughout all included osseous structures, including a high-grade wedge deformity of the T12 vertebral body and associated focal kyphosis. 3. No new evidence of metastatic disease in the chest. 4. Bandlike scarring of the right lung, with volume loss and elevation of the right hemidiaphragm. Trace pleural effusions, unchanged. 5. Moderate hiatal hernia with intrathoracic position of the gastric fundus. 6. Coronary artery disease.  Aortic Atherosclerosis (ICD10-I70.0). 7. Cholelithiasis. Electronically Signed   By: Eddie Candle M.D.   On: 12/24/2019 18:34     ELIGIBLE FOR AVAILABLE RESEARCH PROTOCOL: no   ASSESSMENT: 59 y.o. [place], [place] woman presenting 10/26/2018 with right-sided inflammatory breast cancer, stage IV, involvnh lungs, lymph nodes and bones, as follows:  (a) chest CT scan 10/26/2018 shows bilateral pleural effusions, possible lymphangitic spread of tumor, diffuse bony metastatic disease, and significant axillary mediastinal and hilar adenopathy  (b) bone scan 10/27/2018 is a "near [place]" consistent with widespread bony metastatic disease  (c) head CT with and without contrast 10/30/2018 shows no intracranial metastatic disease, multiple calvarial lesions  (d) CA-27-29 on 10/27/2018 was 1810.4  (1) pleural fluid from right thoracentesis 10/28/2018 confirms malignant cells consistent with a breast primary, strongly estrogen and progesterone receptor positive, HER-2 not amplified, with an MIB-1 of 2%  (2) anastrozole started 10/29/2018  (a) palbociclib to start 11/13/2018 at 100 mg dose  (b) denosumab/xgeva started 11/13/2018   (3) associated problems:  (a) hypoxia secondary to effusions  (b) pain from bone lesions and left humeral and vertebral compression  fractures  (c) right upper extremity lymphedema  (d) anemia, requiring transfusion  (e) poor venous access  (4) genetics testing on 12/25/2018 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 STK11, TP53, TSC1, TSC2, and VHL.  The following genes were evaluated for sequence changes only: SDHA and HOXB13 c.251G>A variant only. The [facility] Melanoma Panel analyzed the following 9 genes: BAP1 BRCA2 CDK4 CDKN2A MITF POT1 PTEN RB1 Tp53.   (5) palliative radiation to the left humerus 07/20/2019 - 07/31/2019 Site Technique Total Dose (Gy) Dose per Fx (Gy) Completed Fx Beam Energies  Humerus, Left: Ext_Lt_humerus Complex 30/30 3 10/10 6X    PLAN: Aretta is now a little over a year out from definitive diagnosis of metastatic breast cancer, with very well-controlled disease.  While she does have significant bone involvement this is currently asymptomatic and any bone symptoms or spine problems she has are more likely to be due to her osteoporosis than anything else.  She is tolerating the current medications without any unusual side effect and the plan is to continue them so long as there is no evidence of disease progression.  It is very gratifying that her tumor marker has entered the normal range.  We are going to continue the  Xgeva every 28 days.  She does need to have dental care which unfortunately she cannot afford at present.  She has had no dental symptoms which is good.  She will see [place] again in  3 months.  At that time we will set her up for repeat CT of the chest at the very end of this year  She knows to call for any other issue that may develop before the next visit  Total encounter time 25 minutes.Sarajane Jews C. Vergene Marland, MD Medical Oncology and Hematology [facility] [place], [place] [address] Tel. [phone]    Fax. [phone]   I, Wilburn Mylar, am acting as scribe for Dr. Virgie [name]. Yardley Lekas.  I, Lurline Del MD, have reviewed the above documentation for accuracy and completeness, and I agree with the above.   *Total Encounter Time as defined by the Centers for Medicare and Medicaid Services includes, in addition to the face-to-face time of a patient visit (documented in the note above) non-face-to-face time: obtaining and reviewing outside history, ordering and reviewing medications, tests or procedures, care coordination (communications with other health care professionals or caregivers) and documentation in the medical record.

## 2019-12-29 ENCOUNTER — Inpatient Hospital Stay: Payer: Medicaid Other | Attending: Adult Health

## 2019-12-29 ENCOUNTER — Inpatient Hospital Stay: Payer: Medicaid Other

## 2019-12-29 ENCOUNTER — Other Ambulatory Visit: Payer: Self-pay

## 2019-12-29 ENCOUNTER — Telehealth: Payer: Self-pay | Admitting: Oncology

## 2019-12-29 ENCOUNTER — Inpatient Hospital Stay (HOSPITAL_BASED_OUTPATIENT_CLINIC_OR_DEPARTMENT_OTHER): Payer: Medicaid Other | Admitting: Oncology

## 2019-12-29 VITALS — BP 147/86 | HR 77 | Temp 98.3°F | Resp 18 | Ht 59.0 in | Wt 191.1 lb

## 2019-12-29 DIAGNOSIS — C7951 Secondary malignant neoplasm of bone: Secondary | ICD-10-CM

## 2019-12-29 DIAGNOSIS — R0602 Shortness of breath: Secondary | ICD-10-CM

## 2019-12-29 DIAGNOSIS — Z170 Estrogen receptor positive status [ER+]: Secondary | ICD-10-CM | POA: Diagnosis not present

## 2019-12-29 DIAGNOSIS — D649 Anemia, unspecified: Secondary | ICD-10-CM

## 2019-12-29 DIAGNOSIS — C7800 Secondary malignant neoplasm of unspecified lung: Secondary | ICD-10-CM

## 2019-12-29 DIAGNOSIS — C799 Secondary malignant neoplasm of unspecified site: Secondary | ICD-10-CM

## 2019-12-29 DIAGNOSIS — C50911 Malignant neoplasm of unspecified site of right female breast: Secondary | ICD-10-CM | POA: Diagnosis not present

## 2019-12-29 DIAGNOSIS — C50811 Malignant neoplasm of overlapping sites of right female breast: Secondary | ICD-10-CM

## 2019-12-29 DIAGNOSIS — Z6841 Body Mass Index (BMI) 40.0 and over, adult: Secondary | ICD-10-CM

## 2019-12-29 DIAGNOSIS — C7801 Secondary malignant neoplasm of right lung: Secondary | ICD-10-CM

## 2019-12-29 DIAGNOSIS — J90 Pleural effusion, not elsewhere classified: Secondary | ICD-10-CM

## 2019-12-29 LAB — CBC WITH DIFFERENTIAL/PLATELET
Abs Immature Granulocytes: 0.02 10*3/uL (ref 0.00–0.07)
Basophils Absolute: 0.1 10*3/uL (ref 0.0–0.1)
Basophils Relative: 2 %
Eosinophils Absolute: 0.1 10*3/uL (ref 0.0–0.5)
Eosinophils Relative: 2 %
HCT: 29.4 % — ABNORMAL LOW (ref 36.0–46.0)
Hemoglobin: 9.3 g/dL — ABNORMAL LOW (ref 12.0–15.0)
Immature Granulocytes: 1 %
Lymphocytes Relative: 32 %
Lymphs Abs: 0.9 10*3/uL (ref 0.7–4.0)
MCH: 35.5 pg — ABNORMAL HIGH (ref 26.0–34.0)
MCHC: 31.6 g/dL (ref 30.0–36.0)
MCV: 112.2 fL — ABNORMAL HIGH (ref 80.0–100.0)
Monocytes Absolute: 0.2 10*3/uL (ref 0.1–1.0)
Monocytes Relative: 6 %
Neutro Abs: 1.6 10*3/uL — ABNORMAL LOW (ref 1.7–7.7)
Neutrophils Relative %: 57 %
Platelets: 305 10*3/uL (ref 150–400)
RBC: 2.62 MIL/uL — ABNORMAL LOW (ref 3.87–5.11)
RDW: 14.5 % (ref 11.5–15.5)
WBC: 2.9 10*3/uL — ABNORMAL LOW (ref 4.0–10.5)
nRBC: 0 % (ref 0.0–0.2)

## 2019-12-29 LAB — COMPREHENSIVE METABOLIC PANEL
ALT: 9 U/L (ref 0–44)
AST: 16 U/L (ref 15–41)
Albumin: 3.7 g/dL (ref 3.5–5.0)
Alkaline Phosphatase: 107 U/L (ref 38–126)
Anion gap: 7 (ref 5–15)
BUN: 22 mg/dL — ABNORMAL HIGH (ref 6–20)
CO2: 29 mmol/L (ref 22–32)
Calcium: 10 mg/dL (ref 8.9–10.3)
Chloride: 102 mmol/L (ref 98–111)
Creatinine, Ser: 0.94 mg/dL (ref 0.44–1.00)
GFR calc Af Amer: >60 mL/min (ref >60)
GFR calc non Af Amer: >60 mL/min (ref >60)
Glucose, Bld: 85 mg/dL (ref 70–99)
Potassium: 4.2 mmol/L (ref 3.5–5.1)
Sodium: 138 mmol/L (ref 135–145)
Total Bilirubin: 0.4 mg/dL (ref 0.3–1.2)
Total Protein: 7.1 g/dL (ref 6.5–8.1)

## 2019-12-29 MED ORDER — PALBOCICLIB 100 MG PO TABS: 100.0000 mg | ORAL_TABLET | Freq: Every day | ORAL | 1 refills | Status: DC

## 2019-12-29 MED ORDER — DENOSUMAB 120 MG/1.7ML ~~LOC~~ SOLN
SUBCUTANEOUS | Status: AC
  Filled 2019-12-29: qty 1.7

## 2019-12-29 MED ORDER — KETOCONAZOLE 2 % EX CREA: TOPICAL_CREAM | CUTANEOUS | 6 refills | Status: DC

## 2019-12-29 MED ORDER — ANASTROZOLE 1 MG PO TABS: 1.0000 mg | ORAL_TABLET | Freq: Every day | ORAL | 3 refills | Status: DC

## 2019-12-29 MED ORDER — DENOSUMAB 120 MG/1.7ML ~~LOC~~ SOLN
120.0000 mg | Freq: Once | SUBCUTANEOUS | Status: AC
  Administered 2019-12-29: 120 mg via SUBCUTANEOUS

## 2019-12-29 NOTE — Patient Instructions (Signed)
Denosumab injection What is this medicine? DENOSUMAB (den oh sue mab) slows bone breakdown. Prolia is used to treat osteoporosis in women after menopause and in men, and in people who are taking corticosteroids for 6 months or more. Xgeva is used to treat a high calcium level due to cancer and to prevent bone fractures and other bone problems caused by multiple myeloma or cancer bone metastases. Xgeva is also used to treat giant cell tumor of the bone. This medicine may be used for other purposes; ask your health care provider or pharmacist if you have questions. COMMON BRAND NAME(S): Prolia, XGEVA What should I tell my health care provider before I take this medicine? They need to know if you have any of these conditions:  dental disease  having surgery or tooth extraction  infection  kidney disease  low levels of calcium or Vitamin D in the blood  malnutrition  on hemodialysis  skin conditions or sensitivity  thyroid or parathyroid disease  an unusual reaction to denosumab, other medicines, foods, dyes, or preservatives  pregnant or trying to get pregnant  breast-feeding How should I use this medicine? This medicine is for injection under the skin. It is given by a health care professional in a hospital or clinic setting. A special MedGuide will be given to you before each treatment. Be sure to read this information carefully each time. For Prolia, talk to your pediatrician regarding the use of this medicine in children. Special care may be needed. For Xgeva, talk to your pediatrician regarding the use of this medicine in children. While this drug may be prescribed for children as young as 13 years for selected conditions, precautions do apply. Overdosage: If you think you have taken too much of this medicine contact a poison control center or emergency room at once. NOTE: This medicine is only for you. Do not share this medicine with others. What if I miss a dose? It is  important not to miss your dose. Call your doctor or health care professional if you are unable to keep an appointment. What may interact with this medicine? Do not take this medicine with any of the following medications:  other medicines containing denosumab This medicine may also interact with the following medications:  medicines that lower your chance of fighting infection  steroid medicines like prednisone or cortisone This list may not describe all possible interactions. Give your health care provider a list of all the medicines, herbs, non-prescription drugs, or dietary supplements you use. Also tell them if you smoke, drink alcohol, or use illegal drugs. Some items may interact with your medicine. What should I watch for while using this medicine? Visit your doctor or health care professional for regular checks on your progress. Your doctor or health care professional may order blood tests and other tests to see how you are doing. Call your doctor or health care professional for advice if you get a fever, chills or sore throat, or other symptoms of a cold or flu. Do not treat yourself. This drug may decrease your body's ability to fight infection. Try to avoid being around people who are sick. You should make sure you get enough calcium and vitamin D while you are taking this medicine, unless your doctor tells you not to. Discuss the foods you eat and the vitamins you take with your health care professional. See your dentist regularly. Brush and floss your teeth as directed. Before you have any dental work done, tell your dentist you are   receiving this medicine. Do not become pregnant while taking this medicine or for 5 months after stopping it. Talk with your doctor or health care professional about your birth control options while taking this medicine. Women should inform their doctor if they wish to become pregnant or think they might be pregnant. There is a potential for serious side  effects to an unborn child. Talk to your health care professional or pharmacist for more information. What side effects may I notice from receiving this medicine? Side effects that you should report to your doctor or health care professional as soon as possible:  allergic reactions like skin rash, itching or hives, swelling of the face, lips, or tongue  bone pain  breathing problems  dizziness  jaw pain, especially after dental work  redness, blistering, peeling of the skin  signs and symptoms of infection like fever or chills; cough; sore throat; pain or trouble passing urine  signs of low calcium like fast heartbeat, muscle cramps or muscle pain; pain, tingling, numbness in the hands or feet; seizures  unusual bleeding or bruising  unusually weak or tired Side effects that usually do not require medical attention (report to your doctor or health care professional if they continue or are bothersome):  constipation  diarrhea  headache  joint pain  loss of appetite  muscle pain  runny nose  tiredness  upset stomach This list may not describe all possible side effects. Call your doctor for medical advice about side effects. You may report side effects to FDA at 1-800-FDA-1088. Where should I keep my medicine? This medicine is only given in a clinic, doctor's office, or other health care setting and will not be stored at home. NOTE: This sheet is a summary. It may not cover all possible information. If you have questions about this medicine, talk to your doctor, pharmacist, or health care provider.  2020 Elsevier/Gold Standard (2017-09-20 16:10:44)

## 2019-12-29 NOTE — Telephone Encounter (Signed)
Scheduled appts per 8/3 los. Gave pt a print out of AVS.  

## 2019-12-30 DIAGNOSIS — G893 Neoplasm related pain (acute) (chronic): Secondary | ICD-10-CM | POA: Diagnosis not present

## 2019-12-30 DIAGNOSIS — D649 Anemia, unspecified: Secondary | ICD-10-CM | POA: Diagnosis not present

## 2019-12-30 DIAGNOSIS — C50811 Malignant neoplasm of overlapping sites of right female breast: Secondary | ICD-10-CM | POA: Diagnosis not present

## 2019-12-30 DIAGNOSIS — J90 Pleural effusion, not elsewhere classified: Secondary | ICD-10-CM | POA: Diagnosis not present

## 2019-12-30 DIAGNOSIS — R0602 Shortness of breath: Secondary | ICD-10-CM | POA: Diagnosis not present

## 2019-12-30 DIAGNOSIS — C7951 Secondary malignant neoplasm of bone: Secondary | ICD-10-CM | POA: Diagnosis not present

## 2019-12-30 DIAGNOSIS — C7801 Secondary malignant neoplasm of right lung: Secondary | ICD-10-CM | POA: Diagnosis not present

## 2019-12-30 LAB — CANCER ANTIGEN 27.29: CA 27.29: 31 U/mL (ref 0.0–38.6)

## 2019-12-31 ENCOUNTER — Telehealth: Payer: Self-pay

## 2019-12-31 DIAGNOSIS — C50811 Malignant neoplasm of overlapping sites of right female breast: Secondary | ICD-10-CM | POA: Diagnosis not present

## 2019-12-31 DIAGNOSIS — R0602 Shortness of breath: Secondary | ICD-10-CM | POA: Diagnosis not present

## 2019-12-31 DIAGNOSIS — D649 Anemia, unspecified: Secondary | ICD-10-CM | POA: Diagnosis not present

## 2019-12-31 DIAGNOSIS — C7951 Secondary malignant neoplasm of bone: Secondary | ICD-10-CM | POA: Diagnosis not present

## 2019-12-31 DIAGNOSIS — J90 Pleural effusion, not elsewhere classified: Secondary | ICD-10-CM | POA: Diagnosis not present

## 2019-12-31 DIAGNOSIS — G893 Neoplasm related pain (acute) (chronic): Secondary | ICD-10-CM | POA: Diagnosis not present

## 2019-12-31 DIAGNOSIS — C7801 Secondary malignant neoplasm of right lung: Secondary | ICD-10-CM | POA: Diagnosis not present

## 2019-12-31 NOTE — Telephone Encounter (Signed)
This CM spoke to [place] who confirmed receipt of the application.  She needs to reach out to the patient to schedule an assessment

## 2020-01-01 DIAGNOSIS — C7951 Secondary malignant neoplasm of bone: Secondary | ICD-10-CM | POA: Diagnosis not present

## 2020-01-01 DIAGNOSIS — J90 Pleural effusion, not elsewhere classified: Secondary | ICD-10-CM | POA: Diagnosis not present

## 2020-01-01 DIAGNOSIS — R0602 Shortness of breath: Secondary | ICD-10-CM | POA: Diagnosis not present

## 2020-01-01 DIAGNOSIS — C7801 Secondary malignant neoplasm of right lung: Secondary | ICD-10-CM | POA: Diagnosis not present

## 2020-01-01 DIAGNOSIS — C50811 Malignant neoplasm of overlapping sites of right female breast: Secondary | ICD-10-CM | POA: Diagnosis not present

## 2020-01-01 DIAGNOSIS — G893 Neoplasm related pain (acute) (chronic): Secondary | ICD-10-CM | POA: Diagnosis not present

## 2020-01-01 DIAGNOSIS — D649 Anemia, unspecified: Secondary | ICD-10-CM | POA: Diagnosis not present

## 2020-01-04 DIAGNOSIS — C50811 Malignant neoplasm of overlapping sites of right female breast: Secondary | ICD-10-CM | POA: Diagnosis not present

## 2020-01-04 DIAGNOSIS — C7951 Secondary malignant neoplasm of bone: Secondary | ICD-10-CM | POA: Diagnosis not present

## 2020-01-04 DIAGNOSIS — J90 Pleural effusion, not elsewhere classified: Secondary | ICD-10-CM | POA: Diagnosis not present

## 2020-01-04 DIAGNOSIS — C7801 Secondary malignant neoplasm of right lung: Secondary | ICD-10-CM | POA: Diagnosis not present

## 2020-01-04 DIAGNOSIS — G893 Neoplasm related pain (acute) (chronic): Secondary | ICD-10-CM | POA: Diagnosis not present

## 2020-01-04 DIAGNOSIS — R0602 Shortness of breath: Secondary | ICD-10-CM | POA: Diagnosis not present

## 2020-01-04 DIAGNOSIS — D649 Anemia, unspecified: Secondary | ICD-10-CM | POA: Diagnosis not present

## 2020-01-05 DIAGNOSIS — G893 Neoplasm related pain (acute) (chronic): Secondary | ICD-10-CM | POA: Diagnosis not present

## 2020-01-05 DIAGNOSIS — D649 Anemia, unspecified: Secondary | ICD-10-CM | POA: Diagnosis not present

## 2020-01-05 DIAGNOSIS — C7951 Secondary malignant neoplasm of bone: Secondary | ICD-10-CM | POA: Diagnosis not present

## 2020-01-05 DIAGNOSIS — J90 Pleural effusion, not elsewhere classified: Secondary | ICD-10-CM | POA: Diagnosis not present

## 2020-01-05 DIAGNOSIS — C7801 Secondary malignant neoplasm of right lung: Secondary | ICD-10-CM | POA: Diagnosis not present

## 2020-01-05 DIAGNOSIS — R0602 Shortness of breath: Secondary | ICD-10-CM | POA: Diagnosis not present

## 2020-01-05 DIAGNOSIS — C50811 Malignant neoplasm of overlapping sites of right female breast: Secondary | ICD-10-CM | POA: Diagnosis not present

## 2020-01-06 DIAGNOSIS — J90 Pleural effusion, not elsewhere classified: Secondary | ICD-10-CM | POA: Diagnosis not present

## 2020-01-06 DIAGNOSIS — G893 Neoplasm related pain (acute) (chronic): Secondary | ICD-10-CM | POA: Diagnosis not present

## 2020-01-06 DIAGNOSIS — C50811 Malignant neoplasm of overlapping sites of right female breast: Secondary | ICD-10-CM | POA: Diagnosis not present

## 2020-01-06 DIAGNOSIS — R0602 Shortness of breath: Secondary | ICD-10-CM | POA: Diagnosis not present

## 2020-01-06 DIAGNOSIS — D649 Anemia, unspecified: Secondary | ICD-10-CM | POA: Diagnosis not present

## 2020-01-06 DIAGNOSIS — C7801 Secondary malignant neoplasm of right lung: Secondary | ICD-10-CM | POA: Diagnosis not present

## 2020-01-06 DIAGNOSIS — C7951 Secondary malignant neoplasm of bone: Secondary | ICD-10-CM | POA: Diagnosis not present

## 2020-01-07 DIAGNOSIS — C50811 Malignant neoplasm of overlapping sites of right female breast: Secondary | ICD-10-CM | POA: Diagnosis not present

## 2020-01-07 DIAGNOSIS — G893 Neoplasm related pain (acute) (chronic): Secondary | ICD-10-CM | POA: Diagnosis not present

## 2020-01-07 DIAGNOSIS — C7801 Secondary malignant neoplasm of right lung: Secondary | ICD-10-CM | POA: Diagnosis not present

## 2020-01-07 DIAGNOSIS — R0602 Shortness of breath: Secondary | ICD-10-CM | POA: Diagnosis not present

## 2020-01-07 DIAGNOSIS — C7951 Secondary malignant neoplasm of bone: Secondary | ICD-10-CM | POA: Diagnosis not present

## 2020-01-07 DIAGNOSIS — J90 Pleural effusion, not elsewhere classified: Secondary | ICD-10-CM | POA: Diagnosis not present

## 2020-01-07 DIAGNOSIS — D649 Anemia, unspecified: Secondary | ICD-10-CM | POA: Diagnosis not present

## 2020-01-07 MED FILL — IBRANCE 100 MG TABS: 100 | 28 days supply | Qty: 21 | Fill #0

## 2020-01-08 DIAGNOSIS — D649 Anemia, unspecified: Secondary | ICD-10-CM | POA: Diagnosis not present

## 2020-01-08 DIAGNOSIS — C7951 Secondary malignant neoplasm of bone: Secondary | ICD-10-CM | POA: Diagnosis not present

## 2020-01-08 DIAGNOSIS — J90 Pleural effusion, not elsewhere classified: Secondary | ICD-10-CM | POA: Diagnosis not present

## 2020-01-08 DIAGNOSIS — C7801 Secondary malignant neoplasm of right lung: Secondary | ICD-10-CM | POA: Diagnosis not present

## 2020-01-08 DIAGNOSIS — C50811 Malignant neoplasm of overlapping sites of right female breast: Secondary | ICD-10-CM | POA: Diagnosis not present

## 2020-01-08 DIAGNOSIS — R0602 Shortness of breath: Secondary | ICD-10-CM | POA: Diagnosis not present

## 2020-01-08 DIAGNOSIS — G893 Neoplasm related pain (acute) (chronic): Secondary | ICD-10-CM | POA: Diagnosis not present

## 2020-01-11 DIAGNOSIS — G893 Neoplasm related pain (acute) (chronic): Secondary | ICD-10-CM | POA: Diagnosis not present

## 2020-01-11 DIAGNOSIS — C50811 Malignant neoplasm of overlapping sites of right female breast: Secondary | ICD-10-CM | POA: Diagnosis not present

## 2020-01-11 DIAGNOSIS — C7801 Secondary malignant neoplasm of right lung: Secondary | ICD-10-CM | POA: Diagnosis not present

## 2020-01-11 DIAGNOSIS — C7951 Secondary malignant neoplasm of bone: Secondary | ICD-10-CM | POA: Diagnosis not present

## 2020-01-11 DIAGNOSIS — R0602 Shortness of breath: Secondary | ICD-10-CM | POA: Diagnosis not present

## 2020-01-11 DIAGNOSIS — J90 Pleural effusion, not elsewhere classified: Secondary | ICD-10-CM | POA: Diagnosis not present

## 2020-01-11 DIAGNOSIS — D649 Anemia, unspecified: Secondary | ICD-10-CM | POA: Diagnosis not present

## 2020-01-12 DIAGNOSIS — G893 Neoplasm related pain (acute) (chronic): Secondary | ICD-10-CM | POA: Diagnosis not present

## 2020-01-12 DIAGNOSIS — C7801 Secondary malignant neoplasm of right lung: Secondary | ICD-10-CM | POA: Diagnosis not present

## 2020-01-12 DIAGNOSIS — C50811 Malignant neoplasm of overlapping sites of right female breast: Secondary | ICD-10-CM | POA: Diagnosis not present

## 2020-01-12 DIAGNOSIS — D649 Anemia, unspecified: Secondary | ICD-10-CM | POA: Diagnosis not present

## 2020-01-12 DIAGNOSIS — C7951 Secondary malignant neoplasm of bone: Secondary | ICD-10-CM | POA: Diagnosis not present

## 2020-01-12 DIAGNOSIS — R0602 Shortness of breath: Secondary | ICD-10-CM | POA: Diagnosis not present

## 2020-01-12 DIAGNOSIS — J90 Pleural effusion, not elsewhere classified: Secondary | ICD-10-CM | POA: Diagnosis not present

## 2020-01-13 DIAGNOSIS — G893 Neoplasm related pain (acute) (chronic): Secondary | ICD-10-CM | POA: Diagnosis not present

## 2020-01-13 DIAGNOSIS — C50811 Malignant neoplasm of overlapping sites of right female breast: Secondary | ICD-10-CM | POA: Diagnosis not present

## 2020-01-13 DIAGNOSIS — C7951 Secondary malignant neoplasm of bone: Secondary | ICD-10-CM | POA: Diagnosis not present

## 2020-01-13 DIAGNOSIS — J90 Pleural effusion, not elsewhere classified: Secondary | ICD-10-CM | POA: Diagnosis not present

## 2020-01-13 DIAGNOSIS — C7801 Secondary malignant neoplasm of right lung: Secondary | ICD-10-CM | POA: Diagnosis not present

## 2020-01-13 DIAGNOSIS — R0602 Shortness of breath: Secondary | ICD-10-CM | POA: Diagnosis not present

## 2020-01-13 DIAGNOSIS — D649 Anemia, unspecified: Secondary | ICD-10-CM | POA: Diagnosis not present

## 2020-01-14 DIAGNOSIS — G893 Neoplasm related pain (acute) (chronic): Secondary | ICD-10-CM | POA: Diagnosis not present

## 2020-01-14 DIAGNOSIS — J90 Pleural effusion, not elsewhere classified: Secondary | ICD-10-CM | POA: Diagnosis not present

## 2020-01-14 DIAGNOSIS — C7951 Secondary malignant neoplasm of bone: Secondary | ICD-10-CM | POA: Diagnosis not present

## 2020-01-14 DIAGNOSIS — C50811 Malignant neoplasm of overlapping sites of right female breast: Secondary | ICD-10-CM | POA: Diagnosis not present

## 2020-01-14 DIAGNOSIS — C7801 Secondary malignant neoplasm of right lung: Secondary | ICD-10-CM | POA: Diagnosis not present

## 2020-01-14 DIAGNOSIS — D649 Anemia, unspecified: Secondary | ICD-10-CM | POA: Diagnosis not present

## 2020-01-14 DIAGNOSIS — R0602 Shortness of breath: Secondary | ICD-10-CM | POA: Diagnosis not present

## 2020-01-15 DIAGNOSIS — G893 Neoplasm related pain (acute) (chronic): Secondary | ICD-10-CM | POA: Diagnosis not present

## 2020-01-15 DIAGNOSIS — R0602 Shortness of breath: Secondary | ICD-10-CM | POA: Diagnosis not present

## 2020-01-15 DIAGNOSIS — C7801 Secondary malignant neoplasm of right lung: Secondary | ICD-10-CM | POA: Diagnosis not present

## 2020-01-15 DIAGNOSIS — C7951 Secondary malignant neoplasm of bone: Secondary | ICD-10-CM | POA: Diagnosis not present

## 2020-01-15 DIAGNOSIS — D649 Anemia, unspecified: Secondary | ICD-10-CM | POA: Diagnosis not present

## 2020-01-15 DIAGNOSIS — C50811 Malignant neoplasm of overlapping sites of right female breast: Secondary | ICD-10-CM | POA: Diagnosis not present

## 2020-01-15 DIAGNOSIS — J90 Pleural effusion, not elsewhere classified: Secondary | ICD-10-CM | POA: Diagnosis not present

## 2020-01-18 DIAGNOSIS — C7801 Secondary malignant neoplasm of right lung: Secondary | ICD-10-CM | POA: Diagnosis not present

## 2020-01-18 DIAGNOSIS — R0602 Shortness of breath: Secondary | ICD-10-CM | POA: Diagnosis not present

## 2020-01-18 DIAGNOSIS — C7951 Secondary malignant neoplasm of bone: Secondary | ICD-10-CM | POA: Diagnosis not present

## 2020-01-18 DIAGNOSIS — D649 Anemia, unspecified: Secondary | ICD-10-CM | POA: Diagnosis not present

## 2020-01-18 DIAGNOSIS — J90 Pleural effusion, not elsewhere classified: Secondary | ICD-10-CM | POA: Diagnosis not present

## 2020-01-18 DIAGNOSIS — C50811 Malignant neoplasm of overlapping sites of right female breast: Secondary | ICD-10-CM | POA: Diagnosis not present

## 2020-01-18 DIAGNOSIS — G893 Neoplasm related pain (acute) (chronic): Secondary | ICD-10-CM | POA: Diagnosis not present

## 2020-01-19 DIAGNOSIS — D649 Anemia, unspecified: Secondary | ICD-10-CM | POA: Diagnosis not present

## 2020-01-19 DIAGNOSIS — G893 Neoplasm related pain (acute) (chronic): Secondary | ICD-10-CM | POA: Diagnosis not present

## 2020-01-19 DIAGNOSIS — R0602 Shortness of breath: Secondary | ICD-10-CM | POA: Diagnosis not present

## 2020-01-19 DIAGNOSIS — C50811 Malignant neoplasm of overlapping sites of right female breast: Secondary | ICD-10-CM | POA: Diagnosis not present

## 2020-01-19 DIAGNOSIS — J90 Pleural effusion, not elsewhere classified: Secondary | ICD-10-CM | POA: Diagnosis not present

## 2020-01-19 DIAGNOSIS — C7951 Secondary malignant neoplasm of bone: Secondary | ICD-10-CM | POA: Diagnosis not present

## 2020-01-19 DIAGNOSIS — C7801 Secondary malignant neoplasm of right lung: Secondary | ICD-10-CM | POA: Diagnosis not present

## 2020-01-20 DIAGNOSIS — C7951 Secondary malignant neoplasm of bone: Secondary | ICD-10-CM | POA: Diagnosis not present

## 2020-01-20 DIAGNOSIS — G893 Neoplasm related pain (acute) (chronic): Secondary | ICD-10-CM | POA: Diagnosis not present

## 2020-01-20 DIAGNOSIS — J90 Pleural effusion, not elsewhere classified: Secondary | ICD-10-CM | POA: Diagnosis not present

## 2020-01-20 DIAGNOSIS — C50811 Malignant neoplasm of overlapping sites of right female breast: Secondary | ICD-10-CM | POA: Diagnosis not present

## 2020-01-20 DIAGNOSIS — D649 Anemia, unspecified: Secondary | ICD-10-CM | POA: Diagnosis not present

## 2020-01-20 DIAGNOSIS — C7801 Secondary malignant neoplasm of right lung: Secondary | ICD-10-CM | POA: Diagnosis not present

## 2020-01-20 DIAGNOSIS — R0602 Shortness of breath: Secondary | ICD-10-CM | POA: Diagnosis not present

## 2020-01-21 DIAGNOSIS — G893 Neoplasm related pain (acute) (chronic): Secondary | ICD-10-CM | POA: Diagnosis not present

## 2020-01-21 DIAGNOSIS — R0602 Shortness of breath: Secondary | ICD-10-CM | POA: Diagnosis not present

## 2020-01-21 DIAGNOSIS — C50811 Malignant neoplasm of overlapping sites of right female breast: Secondary | ICD-10-CM | POA: Diagnosis not present

## 2020-01-21 DIAGNOSIS — C7801 Secondary malignant neoplasm of right lung: Secondary | ICD-10-CM | POA: Diagnosis not present

## 2020-01-21 DIAGNOSIS — J90 Pleural effusion, not elsewhere classified: Secondary | ICD-10-CM | POA: Diagnosis not present

## 2020-01-21 DIAGNOSIS — D649 Anemia, unspecified: Secondary | ICD-10-CM | POA: Diagnosis not present

## 2020-01-21 DIAGNOSIS — C7951 Secondary malignant neoplasm of bone: Secondary | ICD-10-CM | POA: Diagnosis not present

## 2020-01-26 ENCOUNTER — Inpatient Hospital Stay: Payer: Medicaid Other

## 2020-01-26 ENCOUNTER — Other Ambulatory Visit: Payer: Self-pay

## 2020-01-26 VITALS — BP 152/66 | HR 75 | Temp 98.6°F | Resp 18

## 2020-01-26 DIAGNOSIS — J90 Pleural effusion, not elsewhere classified: Secondary | ICD-10-CM

## 2020-01-26 DIAGNOSIS — C50811 Malignant neoplasm of overlapping sites of right female breast: Secondary | ICD-10-CM

## 2020-01-26 DIAGNOSIS — C7951 Secondary malignant neoplasm of bone: Secondary | ICD-10-CM

## 2020-01-26 DIAGNOSIS — R0602 Shortness of breath: Secondary | ICD-10-CM

## 2020-01-26 DIAGNOSIS — C7801 Secondary malignant neoplasm of right lung: Secondary | ICD-10-CM

## 2020-01-26 DIAGNOSIS — G893 Neoplasm related pain (acute) (chronic): Secondary | ICD-10-CM

## 2020-01-26 DIAGNOSIS — D649 Anemia, unspecified: Secondary | ICD-10-CM

## 2020-01-26 LAB — COMPREHENSIVE METABOLIC PANEL
ALT: <6 U/L (ref 0–44)
AST: 14 U/L — ABNORMAL LOW (ref 15–41)
Albumin: 3.5 g/dL (ref 3.5–5.0)
Alkaline Phosphatase: 97 U/L (ref 38–126)
Anion gap: 5 (ref 5–15)
BUN: 24 mg/dL — ABNORMAL HIGH (ref 8–23)
CO2: 30 mmol/L (ref 22–32)
Calcium: 10.2 mg/dL (ref 8.9–10.3)
Chloride: 104 mmol/L (ref 98–111)
Creatinine, Ser: 0.9 mg/dL (ref 0.44–1.00)
GFR calc Af Amer: >60 mL/min (ref >60)
GFR calc non Af Amer: >60 mL/min (ref >60)
Glucose, Bld: 91 mg/dL (ref 70–99)
Potassium: 4.4 mmol/L (ref 3.5–5.1)
Sodium: 139 mmol/L (ref 135–145)
Total Bilirubin: 0.3 mg/dL (ref 0.3–1.2)
Total Protein: 6.8 g/dL (ref 6.5–8.1)

## 2020-01-26 LAB — CBC WITH DIFFERENTIAL/PLATELET
Abs Immature Granulocytes: 0.01 10*3/uL (ref 0.00–0.07)
Basophils Absolute: 0.1 10*3/uL (ref 0.0–0.1)
Basophils Relative: 3 %
Eosinophils Absolute: 0.1 10*3/uL (ref 0.0–0.5)
Eosinophils Relative: 2 %
HCT: 27 % — ABNORMAL LOW (ref 36.0–46.0)
Hemoglobin: 8.5 g/dL — ABNORMAL LOW (ref 12.0–15.0)
Immature Granulocytes: 0 %
Lymphocytes Relative: 31 %
Lymphs Abs: 0.9 10*3/uL (ref 0.7–4.0)
MCH: 35.6 pg — ABNORMAL HIGH (ref 26.0–34.0)
MCHC: 31.5 g/dL (ref 30.0–36.0)
MCV: 113 fL — ABNORMAL HIGH (ref 80.0–100.0)
Monocytes Absolute: 0.3 10*3/uL (ref 0.1–1.0)
Monocytes Relative: 9 %
Neutro Abs: 1.6 10*3/uL — ABNORMAL LOW (ref 1.7–7.7)
Neutrophils Relative %: 55 %
Platelets: 281 10*3/uL (ref 150–400)
RBC: 2.39 MIL/uL — ABNORMAL LOW (ref 3.87–5.11)
RDW: 15 % (ref 11.5–15.5)
WBC: 3 10*3/uL — ABNORMAL LOW (ref 4.0–10.5)
nRBC: 0 % (ref 0.0–0.2)

## 2020-01-26 MED ORDER — DENOSUMAB 120 MG/1.7ML ~~LOC~~ SOLN
120.0000 mg | Freq: Once | SUBCUTANEOUS | Status: AC
  Administered 2020-01-26: 120 mg via SUBCUTANEOUS

## 2020-01-26 MED ORDER — DENOSUMAB 120 MG/1.7ML ~~LOC~~ SOLN
SUBCUTANEOUS | Status: AC
  Filled 2020-01-26: qty 1.7

## 2020-01-27 DIAGNOSIS — C50811 Malignant neoplasm of overlapping sites of right female breast: Secondary | ICD-10-CM | POA: Diagnosis not present

## 2020-01-27 DIAGNOSIS — S22000A Wedge compression fracture of unspecified thoracic vertebra, initial encounter for closed fracture: Secondary | ICD-10-CM | POA: Diagnosis not present

## 2020-01-27 DIAGNOSIS — C7801 Secondary malignant neoplasm of right lung: Secondary | ICD-10-CM | POA: Diagnosis not present

## 2020-01-27 DIAGNOSIS — R69 Illness, unspecified: Secondary | ICD-10-CM | POA: Diagnosis not present

## 2020-01-27 DIAGNOSIS — G893 Neoplasm related pain (acute) (chronic): Secondary | ICD-10-CM | POA: Diagnosis not present

## 2020-01-27 DIAGNOSIS — J90 Pleural effusion, not elsewhere classified: Secondary | ICD-10-CM | POA: Diagnosis not present

## 2020-01-27 DIAGNOSIS — R0602 Shortness of breath: Secondary | ICD-10-CM | POA: Diagnosis not present

## 2020-01-27 DIAGNOSIS — C7951 Secondary malignant neoplasm of bone: Secondary | ICD-10-CM | POA: Diagnosis not present

## 2020-01-27 DIAGNOSIS — D649 Anemia, unspecified: Secondary | ICD-10-CM | POA: Diagnosis not present

## 2020-01-27 LAB — CANCER ANTIGEN 27.29: CA 27.29: 22.3 U/mL (ref 0.0–38.6)

## 2020-01-28 DIAGNOSIS — C7801 Secondary malignant neoplasm of right lung: Secondary | ICD-10-CM | POA: Diagnosis not present

## 2020-01-28 DIAGNOSIS — R0602 Shortness of breath: Secondary | ICD-10-CM | POA: Diagnosis not present

## 2020-01-28 DIAGNOSIS — J90 Pleural effusion, not elsewhere classified: Secondary | ICD-10-CM | POA: Diagnosis not present

## 2020-01-28 DIAGNOSIS — G893 Neoplasm related pain (acute) (chronic): Secondary | ICD-10-CM | POA: Diagnosis not present

## 2020-01-28 DIAGNOSIS — C50811 Malignant neoplasm of overlapping sites of right female breast: Secondary | ICD-10-CM | POA: Diagnosis not present

## 2020-01-28 DIAGNOSIS — C7951 Secondary malignant neoplasm of bone: Secondary | ICD-10-CM | POA: Diagnosis not present

## 2020-01-28 DIAGNOSIS — R69 Illness, unspecified: Secondary | ICD-10-CM | POA: Diagnosis not present

## 2020-01-28 DIAGNOSIS — D649 Anemia, unspecified: Secondary | ICD-10-CM | POA: Diagnosis not present

## 2020-01-29 DIAGNOSIS — J90 Pleural effusion, not elsewhere classified: Secondary | ICD-10-CM | POA: Diagnosis not present

## 2020-01-29 DIAGNOSIS — G893 Neoplasm related pain (acute) (chronic): Secondary | ICD-10-CM | POA: Diagnosis not present

## 2020-01-29 DIAGNOSIS — C50811 Malignant neoplasm of overlapping sites of right female breast: Secondary | ICD-10-CM | POA: Diagnosis not present

## 2020-01-29 DIAGNOSIS — C7951 Secondary malignant neoplasm of bone: Secondary | ICD-10-CM | POA: Diagnosis not present

## 2020-01-29 DIAGNOSIS — R0602 Shortness of breath: Secondary | ICD-10-CM | POA: Diagnosis not present

## 2020-01-29 DIAGNOSIS — C7801 Secondary malignant neoplasm of right lung: Secondary | ICD-10-CM | POA: Diagnosis not present

## 2020-01-29 DIAGNOSIS — R69 Illness, unspecified: Secondary | ICD-10-CM | POA: Diagnosis not present

## 2020-01-29 DIAGNOSIS — D649 Anemia, unspecified: Secondary | ICD-10-CM | POA: Diagnosis not present

## 2020-02-01 DIAGNOSIS — R69 Illness, unspecified: Secondary | ICD-10-CM | POA: Diagnosis not present

## 2020-02-01 DIAGNOSIS — D649 Anemia, unspecified: Secondary | ICD-10-CM | POA: Diagnosis not present

## 2020-02-01 DIAGNOSIS — C7801 Secondary malignant neoplasm of right lung: Secondary | ICD-10-CM | POA: Diagnosis not present

## 2020-02-01 DIAGNOSIS — G893 Neoplasm related pain (acute) (chronic): Secondary | ICD-10-CM | POA: Diagnosis not present

## 2020-02-01 DIAGNOSIS — R0602 Shortness of breath: Secondary | ICD-10-CM | POA: Diagnosis not present

## 2020-02-01 DIAGNOSIS — C50811 Malignant neoplasm of overlapping sites of right female breast: Secondary | ICD-10-CM | POA: Diagnosis not present

## 2020-02-01 DIAGNOSIS — J90 Pleural effusion, not elsewhere classified: Secondary | ICD-10-CM | POA: Diagnosis not present

## 2020-02-01 DIAGNOSIS — C7951 Secondary malignant neoplasm of bone: Secondary | ICD-10-CM | POA: Diagnosis not present

## 2020-02-02 DIAGNOSIS — C50811 Malignant neoplasm of overlapping sites of right female breast: Secondary | ICD-10-CM | POA: Diagnosis not present

## 2020-02-02 DIAGNOSIS — R69 Illness, unspecified: Secondary | ICD-10-CM | POA: Diagnosis not present

## 2020-02-02 DIAGNOSIS — D649 Anemia, unspecified: Secondary | ICD-10-CM | POA: Diagnosis not present

## 2020-02-02 DIAGNOSIS — G893 Neoplasm related pain (acute) (chronic): Secondary | ICD-10-CM | POA: Diagnosis not present

## 2020-02-02 DIAGNOSIS — C7951 Secondary malignant neoplasm of bone: Secondary | ICD-10-CM | POA: Diagnosis not present

## 2020-02-02 DIAGNOSIS — R0602 Shortness of breath: Secondary | ICD-10-CM | POA: Diagnosis not present

## 2020-02-02 DIAGNOSIS — C7801 Secondary malignant neoplasm of right lung: Secondary | ICD-10-CM | POA: Diagnosis not present

## 2020-02-02 DIAGNOSIS — J90 Pleural effusion, not elsewhere classified: Secondary | ICD-10-CM | POA: Diagnosis not present

## 2020-02-03 DIAGNOSIS — C50811 Malignant neoplasm of overlapping sites of right female breast: Secondary | ICD-10-CM | POA: Diagnosis not present

## 2020-02-03 DIAGNOSIS — J90 Pleural effusion, not elsewhere classified: Secondary | ICD-10-CM | POA: Diagnosis not present

## 2020-02-03 DIAGNOSIS — G893 Neoplasm related pain (acute) (chronic): Secondary | ICD-10-CM | POA: Diagnosis not present

## 2020-02-03 DIAGNOSIS — R69 Illness, unspecified: Secondary | ICD-10-CM | POA: Diagnosis not present

## 2020-02-03 DIAGNOSIS — C7801 Secondary malignant neoplasm of right lung: Secondary | ICD-10-CM | POA: Diagnosis not present

## 2020-02-03 DIAGNOSIS — D649 Anemia, unspecified: Secondary | ICD-10-CM | POA: Diagnosis not present

## 2020-02-03 DIAGNOSIS — R0602 Shortness of breath: Secondary | ICD-10-CM | POA: Diagnosis not present

## 2020-02-03 DIAGNOSIS — C7951 Secondary malignant neoplasm of bone: Secondary | ICD-10-CM | POA: Diagnosis not present

## 2020-02-04 DIAGNOSIS — R69 Illness, unspecified: Secondary | ICD-10-CM | POA: Diagnosis not present

## 2020-02-04 DIAGNOSIS — C7951 Secondary malignant neoplasm of bone: Secondary | ICD-10-CM | POA: Diagnosis not present

## 2020-02-04 DIAGNOSIS — G893 Neoplasm related pain (acute) (chronic): Secondary | ICD-10-CM | POA: Diagnosis not present

## 2020-02-04 DIAGNOSIS — J90 Pleural effusion, not elsewhere classified: Secondary | ICD-10-CM | POA: Diagnosis not present

## 2020-02-04 DIAGNOSIS — C50811 Malignant neoplasm of overlapping sites of right female breast: Secondary | ICD-10-CM | POA: Diagnosis not present

## 2020-02-04 DIAGNOSIS — R0602 Shortness of breath: Secondary | ICD-10-CM | POA: Diagnosis not present

## 2020-02-04 DIAGNOSIS — C7801 Secondary malignant neoplasm of right lung: Secondary | ICD-10-CM | POA: Diagnosis not present

## 2020-02-04 DIAGNOSIS — D649 Anemia, unspecified: Secondary | ICD-10-CM | POA: Diagnosis not present

## 2020-02-05 DIAGNOSIS — C7801 Secondary malignant neoplasm of right lung: Secondary | ICD-10-CM | POA: Diagnosis not present

## 2020-02-05 DIAGNOSIS — D649 Anemia, unspecified: Secondary | ICD-10-CM | POA: Diagnosis not present

## 2020-02-05 DIAGNOSIS — J90 Pleural effusion, not elsewhere classified: Secondary | ICD-10-CM | POA: Diagnosis not present

## 2020-02-05 DIAGNOSIS — R69 Illness, unspecified: Secondary | ICD-10-CM | POA: Diagnosis not present

## 2020-02-05 DIAGNOSIS — R0602 Shortness of breath: Secondary | ICD-10-CM | POA: Diagnosis not present

## 2020-02-05 DIAGNOSIS — C7951 Secondary malignant neoplasm of bone: Secondary | ICD-10-CM | POA: Diagnosis not present

## 2020-02-05 DIAGNOSIS — G893 Neoplasm related pain (acute) (chronic): Secondary | ICD-10-CM | POA: Diagnosis not present

## 2020-02-05 DIAGNOSIS — C50811 Malignant neoplasm of overlapping sites of right female breast: Secondary | ICD-10-CM | POA: Diagnosis not present

## 2020-02-08 DIAGNOSIS — R0602 Shortness of breath: Secondary | ICD-10-CM | POA: Diagnosis not present

## 2020-02-08 DIAGNOSIS — J90 Pleural effusion, not elsewhere classified: Secondary | ICD-10-CM | POA: Diagnosis not present

## 2020-02-08 DIAGNOSIS — C50811 Malignant neoplasm of overlapping sites of right female breast: Secondary | ICD-10-CM | POA: Diagnosis not present

## 2020-02-08 DIAGNOSIS — G893 Neoplasm related pain (acute) (chronic): Secondary | ICD-10-CM | POA: Diagnosis not present

## 2020-02-08 DIAGNOSIS — C7801 Secondary malignant neoplasm of right lung: Secondary | ICD-10-CM | POA: Diagnosis not present

## 2020-02-08 DIAGNOSIS — C7951 Secondary malignant neoplasm of bone: Secondary | ICD-10-CM | POA: Diagnosis not present

## 2020-02-08 DIAGNOSIS — D649 Anemia, unspecified: Secondary | ICD-10-CM | POA: Diagnosis not present

## 2020-02-08 DIAGNOSIS — R69 Illness, unspecified: Secondary | ICD-10-CM | POA: Diagnosis not present

## 2020-02-08 MED FILL — IBRANCE 100 MG TABS: 100 | 28 days supply | Qty: 21 | Fill #1

## 2020-02-10 DIAGNOSIS — R69 Illness, unspecified: Secondary | ICD-10-CM | POA: Diagnosis not present

## 2020-02-10 DIAGNOSIS — C50811 Malignant neoplasm of overlapping sites of right female breast: Secondary | ICD-10-CM | POA: Diagnosis not present

## 2020-02-10 DIAGNOSIS — G893 Neoplasm related pain (acute) (chronic): Secondary | ICD-10-CM | POA: Diagnosis not present

## 2020-02-10 DIAGNOSIS — C7951 Secondary malignant neoplasm of bone: Secondary | ICD-10-CM | POA: Diagnosis not present

## 2020-02-10 DIAGNOSIS — J90 Pleural effusion, not elsewhere classified: Secondary | ICD-10-CM | POA: Diagnosis not present

## 2020-02-10 DIAGNOSIS — R0602 Shortness of breath: Secondary | ICD-10-CM | POA: Diagnosis not present

## 2020-02-10 DIAGNOSIS — C7801 Secondary malignant neoplasm of right lung: Secondary | ICD-10-CM | POA: Diagnosis not present

## 2020-02-10 DIAGNOSIS — D649 Anemia, unspecified: Secondary | ICD-10-CM | POA: Diagnosis not present

## 2020-02-11 DIAGNOSIS — R69 Illness, unspecified: Secondary | ICD-10-CM | POA: Diagnosis not present

## 2020-02-11 DIAGNOSIS — G893 Neoplasm related pain (acute) (chronic): Secondary | ICD-10-CM | POA: Diagnosis not present

## 2020-02-11 DIAGNOSIS — C7951 Secondary malignant neoplasm of bone: Secondary | ICD-10-CM | POA: Diagnosis not present

## 2020-02-11 DIAGNOSIS — R0602 Shortness of breath: Secondary | ICD-10-CM | POA: Diagnosis not present

## 2020-02-11 DIAGNOSIS — C7801 Secondary malignant neoplasm of right lung: Secondary | ICD-10-CM | POA: Diagnosis not present

## 2020-02-11 DIAGNOSIS — D649 Anemia, unspecified: Secondary | ICD-10-CM | POA: Diagnosis not present

## 2020-02-11 DIAGNOSIS — J90 Pleural effusion, not elsewhere classified: Secondary | ICD-10-CM | POA: Diagnosis not present

## 2020-02-11 DIAGNOSIS — C50811 Malignant neoplasm of overlapping sites of right female breast: Secondary | ICD-10-CM | POA: Diagnosis not present

## 2020-02-12 DIAGNOSIS — C50811 Malignant neoplasm of overlapping sites of right female breast: Secondary | ICD-10-CM | POA: Diagnosis not present

## 2020-02-12 DIAGNOSIS — D649 Anemia, unspecified: Secondary | ICD-10-CM | POA: Diagnosis not present

## 2020-02-12 DIAGNOSIS — G893 Neoplasm related pain (acute) (chronic): Secondary | ICD-10-CM | POA: Diagnosis not present

## 2020-02-12 DIAGNOSIS — R0602 Shortness of breath: Secondary | ICD-10-CM | POA: Diagnosis not present

## 2020-02-12 DIAGNOSIS — R69 Illness, unspecified: Secondary | ICD-10-CM | POA: Diagnosis not present

## 2020-02-12 DIAGNOSIS — J90 Pleural effusion, not elsewhere classified: Secondary | ICD-10-CM | POA: Diagnosis not present

## 2020-02-12 DIAGNOSIS — C7801 Secondary malignant neoplasm of right lung: Secondary | ICD-10-CM | POA: Diagnosis not present

## 2020-02-12 DIAGNOSIS — C7951 Secondary malignant neoplasm of bone: Secondary | ICD-10-CM | POA: Diagnosis not present

## 2020-02-15 ENCOUNTER — Encounter: Payer: Self-pay | Admitting: *Deleted

## 2020-02-15 DIAGNOSIS — J90 Pleural effusion, not elsewhere classified: Secondary | ICD-10-CM | POA: Diagnosis not present

## 2020-02-15 DIAGNOSIS — R0602 Shortness of breath: Secondary | ICD-10-CM | POA: Diagnosis not present

## 2020-02-15 DIAGNOSIS — D649 Anemia, unspecified: Secondary | ICD-10-CM | POA: Diagnosis not present

## 2020-02-15 DIAGNOSIS — C7951 Secondary malignant neoplasm of bone: Secondary | ICD-10-CM | POA: Diagnosis not present

## 2020-02-15 DIAGNOSIS — C50811 Malignant neoplasm of overlapping sites of right female breast: Secondary | ICD-10-CM | POA: Diagnosis not present

## 2020-02-15 DIAGNOSIS — C7801 Secondary malignant neoplasm of right lung: Secondary | ICD-10-CM | POA: Diagnosis not present

## 2020-02-15 DIAGNOSIS — G893 Neoplasm related pain (acute) (chronic): Secondary | ICD-10-CM | POA: Diagnosis not present

## 2020-02-15 DIAGNOSIS — R69 Illness, unspecified: Secondary | ICD-10-CM | POA: Diagnosis not present

## 2020-02-16 DIAGNOSIS — G893 Neoplasm related pain (acute) (chronic): Secondary | ICD-10-CM | POA: Diagnosis not present

## 2020-02-16 DIAGNOSIS — C50811 Malignant neoplasm of overlapping sites of right female breast: Secondary | ICD-10-CM | POA: Diagnosis not present

## 2020-02-16 DIAGNOSIS — D649 Anemia, unspecified: Secondary | ICD-10-CM | POA: Diagnosis not present

## 2020-02-16 DIAGNOSIS — R0602 Shortness of breath: Secondary | ICD-10-CM | POA: Diagnosis not present

## 2020-02-16 DIAGNOSIS — C7801 Secondary malignant neoplasm of right lung: Secondary | ICD-10-CM | POA: Diagnosis not present

## 2020-02-16 DIAGNOSIS — J90 Pleural effusion, not elsewhere classified: Secondary | ICD-10-CM | POA: Diagnosis not present

## 2020-02-16 DIAGNOSIS — R69 Illness, unspecified: Secondary | ICD-10-CM | POA: Diagnosis not present

## 2020-02-16 DIAGNOSIS — C7951 Secondary malignant neoplasm of bone: Secondary | ICD-10-CM | POA: Diagnosis not present

## 2020-02-17 DIAGNOSIS — J90 Pleural effusion, not elsewhere classified: Secondary | ICD-10-CM | POA: Diagnosis not present

## 2020-02-17 DIAGNOSIS — C7801 Secondary malignant neoplasm of right lung: Secondary | ICD-10-CM | POA: Diagnosis not present

## 2020-02-17 DIAGNOSIS — D649 Anemia, unspecified: Secondary | ICD-10-CM | POA: Diagnosis not present

## 2020-02-17 DIAGNOSIS — R0602 Shortness of breath: Secondary | ICD-10-CM | POA: Diagnosis not present

## 2020-02-17 DIAGNOSIS — C7951 Secondary malignant neoplasm of bone: Secondary | ICD-10-CM | POA: Diagnosis not present

## 2020-02-17 DIAGNOSIS — R69 Illness, unspecified: Secondary | ICD-10-CM | POA: Diagnosis not present

## 2020-02-17 DIAGNOSIS — G893 Neoplasm related pain (acute) (chronic): Secondary | ICD-10-CM | POA: Diagnosis not present

## 2020-02-17 DIAGNOSIS — C50811 Malignant neoplasm of overlapping sites of right female breast: Secondary | ICD-10-CM | POA: Diagnosis not present

## 2020-02-18 DIAGNOSIS — G893 Neoplasm related pain (acute) (chronic): Secondary | ICD-10-CM | POA: Diagnosis not present

## 2020-02-18 DIAGNOSIS — R0602 Shortness of breath: Secondary | ICD-10-CM | POA: Diagnosis not present

## 2020-02-18 DIAGNOSIS — C50811 Malignant neoplasm of overlapping sites of right female breast: Secondary | ICD-10-CM | POA: Diagnosis not present

## 2020-02-18 DIAGNOSIS — R69 Illness, unspecified: Secondary | ICD-10-CM | POA: Diagnosis not present

## 2020-02-18 DIAGNOSIS — C7801 Secondary malignant neoplasm of right lung: Secondary | ICD-10-CM | POA: Diagnosis not present

## 2020-02-18 DIAGNOSIS — J90 Pleural effusion, not elsewhere classified: Secondary | ICD-10-CM | POA: Diagnosis not present

## 2020-02-18 DIAGNOSIS — C7951 Secondary malignant neoplasm of bone: Secondary | ICD-10-CM | POA: Diagnosis not present

## 2020-02-18 DIAGNOSIS — D649 Anemia, unspecified: Secondary | ICD-10-CM | POA: Diagnosis not present

## 2020-02-19 DIAGNOSIS — R69 Illness, unspecified: Secondary | ICD-10-CM | POA: Diagnosis not present

## 2020-02-19 DIAGNOSIS — C7801 Secondary malignant neoplasm of right lung: Secondary | ICD-10-CM | POA: Diagnosis not present

## 2020-02-19 DIAGNOSIS — G893 Neoplasm related pain (acute) (chronic): Secondary | ICD-10-CM | POA: Diagnosis not present

## 2020-02-19 DIAGNOSIS — J90 Pleural effusion, not elsewhere classified: Secondary | ICD-10-CM | POA: Diagnosis not present

## 2020-02-19 DIAGNOSIS — C50811 Malignant neoplasm of overlapping sites of right female breast: Secondary | ICD-10-CM | POA: Diagnosis not present

## 2020-02-19 DIAGNOSIS — D649 Anemia, unspecified: Secondary | ICD-10-CM | POA: Diagnosis not present

## 2020-02-19 DIAGNOSIS — C7951 Secondary malignant neoplasm of bone: Secondary | ICD-10-CM | POA: Diagnosis not present

## 2020-02-19 DIAGNOSIS — R0602 Shortness of breath: Secondary | ICD-10-CM | POA: Diagnosis not present

## 2020-02-22 DIAGNOSIS — C50811 Malignant neoplasm of overlapping sites of right female breast: Secondary | ICD-10-CM | POA: Diagnosis not present

## 2020-02-22 DIAGNOSIS — R69 Illness, unspecified: Secondary | ICD-10-CM | POA: Diagnosis not present

## 2020-02-23 ENCOUNTER — Inpatient Hospital Stay: Payer: Medicaid Other

## 2020-02-23 ENCOUNTER — Other Ambulatory Visit: Payer: Self-pay | Admitting: Oncology

## 2020-02-23 DIAGNOSIS — C50811 Malignant neoplasm of overlapping sites of right female breast: Secondary | ICD-10-CM

## 2020-02-23 DIAGNOSIS — Z170 Estrogen receptor positive status [ER+]: Secondary | ICD-10-CM

## 2020-02-24 ENCOUNTER — Other Ambulatory Visit: Payer: Self-pay

## 2020-02-24 ENCOUNTER — Inpatient Hospital Stay: Payer: Medicaid Other

## 2020-02-24 ENCOUNTER — Inpatient Hospital Stay: Payer: Medicaid Other | Attending: Adult Health

## 2020-02-24 VITALS — BP 123/86 | HR 79 | Temp 98.0°F | Resp 18

## 2020-02-24 DIAGNOSIS — Z6841 Body Mass Index (BMI) 40.0 and over, adult: Secondary | ICD-10-CM

## 2020-02-24 DIAGNOSIS — Z23 Encounter for immunization: Secondary | ICD-10-CM | POA: Diagnosis not present

## 2020-02-24 DIAGNOSIS — G893 Neoplasm related pain (acute) (chronic): Secondary | ICD-10-CM

## 2020-02-24 DIAGNOSIS — C7951 Secondary malignant neoplasm of bone: Secondary | ICD-10-CM

## 2020-02-24 DIAGNOSIS — C50811 Malignant neoplasm of overlapping sites of right female breast: Secondary | ICD-10-CM

## 2020-02-24 DIAGNOSIS — Z170 Estrogen receptor positive status [ER+]: Secondary | ICD-10-CM

## 2020-02-24 DIAGNOSIS — J90 Pleural effusion, not elsewhere classified: Secondary | ICD-10-CM

## 2020-02-24 DIAGNOSIS — C50911 Malignant neoplasm of unspecified site of right female breast: Secondary | ICD-10-CM | POA: Diagnosis present

## 2020-02-24 DIAGNOSIS — R0602 Shortness of breath: Secondary | ICD-10-CM

## 2020-02-24 DIAGNOSIS — C7801 Secondary malignant neoplasm of right lung: Secondary | ICD-10-CM

## 2020-02-24 DIAGNOSIS — D649 Anemia, unspecified: Secondary | ICD-10-CM

## 2020-02-24 LAB — CBC WITH DIFFERENTIAL/PLATELET
Abs Immature Granulocytes: 0.01 10*3/uL (ref 0.00–0.07)
Basophils Absolute: 0.1 10*3/uL (ref 0.0–0.1)
Basophils Relative: 2 %
Eosinophils Absolute: 0.1 10*3/uL (ref 0.0–0.5)
Eosinophils Relative: 2 %
HCT: 29 % — ABNORMAL LOW (ref 36.0–46.0)
Hemoglobin: 9.3 g/dL — ABNORMAL LOW (ref 12.0–15.0)
Immature Granulocytes: 0 %
Lymphocytes Relative: 34 %
Lymphs Abs: 0.9 10*3/uL (ref 0.7–4.0)
MCH: 36 pg — ABNORMAL HIGH (ref 26.0–34.0)
MCHC: 32.1 g/dL (ref 30.0–36.0)
MCV: 112.4 fL — ABNORMAL HIGH (ref 80.0–100.0)
Monocytes Absolute: 0.2 10*3/uL (ref 0.1–1.0)
Monocytes Relative: 8 %
Neutro Abs: 1.3 10*3/uL — ABNORMAL LOW (ref 1.7–7.7)
Neutrophils Relative %: 54 %
Platelets: 234 10*3/uL (ref 150–400)
RBC: 2.58 MIL/uL — ABNORMAL LOW (ref 3.87–5.11)
RDW: 16.4 % — ABNORMAL HIGH (ref 11.5–15.5)
WBC: 2.5 10*3/uL — ABNORMAL LOW (ref 4.0–10.5)
nRBC: 0 % (ref 0.0–0.2)

## 2020-02-24 LAB — COMPREHENSIVE METABOLIC PANEL
ALT: 14 U/L (ref 0–44)
AST: 20 U/L (ref 15–41)
Albumin: 3.4 g/dL — ABNORMAL LOW (ref 3.5–5.0)
Alkaline Phosphatase: 92 U/L (ref 38–126)
Anion gap: 8 (ref 5–15)
BUN: 22 mg/dL (ref 8–23)
CO2: 31 mmol/L (ref 22–32)
Calcium: 9.5 mg/dL (ref 8.9–10.3)
Chloride: 101 mmol/L (ref 98–111)
Creatinine, Ser: 1.02 mg/dL — ABNORMAL HIGH (ref 0.44–1.00)
GFR calc Af Amer: >60 mL/min (ref >60)
GFR calc non Af Amer: 59 mL/min — ABNORMAL LOW (ref >60)
Glucose, Bld: 101 mg/dL — ABNORMAL HIGH (ref 70–99)
Potassium: 3.9 mmol/L (ref 3.5–5.1)
Sodium: 140 mmol/L (ref 135–145)
Total Bilirubin: 0.3 mg/dL (ref 0.3–1.2)
Total Protein: 6.9 g/dL (ref 6.5–8.1)

## 2020-02-24 MED ORDER — INFLUENZA VAC SPLIT QUAD 0.5 ML IM SUSY
PREFILLED_SYRINGE | INTRAMUSCULAR | Status: AC
  Filled 2020-02-24: qty 0.5

## 2020-02-24 MED ORDER — DENOSUMAB 120 MG/1.7ML ~~LOC~~ SOLN
SUBCUTANEOUS | Status: AC
  Filled 2020-02-24: qty 1.7

## 2020-02-24 MED ORDER — DENOSUMAB 120 MG/1.7ML ~~LOC~~ SOLN
120.0000 mg | Freq: Once | SUBCUTANEOUS | Status: AC
  Administered 2020-02-24: 120 mg via SUBCUTANEOUS

## 2020-02-24 MED ORDER — INFLUENZA VAC SPLIT QUAD 0.5 ML IM SUSY
0.5000 mL | PREFILLED_SYRINGE | Freq: Once | INTRAMUSCULAR | Status: AC
  Administered 2020-02-24: 0.5 mL via INTRAMUSCULAR

## 2020-02-24 NOTE — Patient Instructions (Signed)
Denosumab injection What is this medicine? DENOSUMAB (den oh sue mab) slows bone breakdown. Prolia is used to treat osteoporosis in women after menopause and in men, and in people who are taking corticosteroids for 6 months or more. Xgeva is used to treat a high calcium level due to cancer and to prevent bone fractures and other bone problems caused by multiple myeloma or cancer bone metastases. Xgeva is also used to treat giant cell tumor of the bone. This medicine may be used for other purposes; ask your health care provider or pharmacist if you have questions. COMMON BRAND NAME(S): Prolia, XGEVA What should I tell my health care provider before I take this medicine? They need to know if you have any of these conditions:  dental disease  having surgery or tooth extraction  infection  kidney disease  low levels of calcium or Vitamin D in the blood  malnutrition  on hemodialysis  skin conditions or sensitivity  thyroid or parathyroid disease  an unusual reaction to denosumab, other medicines, foods, dyes, or preservatives  pregnant or trying to get pregnant  breast-feeding How should I use this medicine? This medicine is for injection under the skin. It is given by a health care professional in a hospital or clinic setting. A special MedGuide will be given to you before each treatment. Be sure to read this information carefully each time. For Prolia, talk to your pediatrician regarding the use of this medicine in children. Special care may be needed. For Xgeva, talk to your pediatrician regarding the use of this medicine in children. While this drug may be prescribed for children as young as 13 years for selected conditions, precautions do apply. Overdosage: If you think you have taken too much of this medicine contact a poison control center or emergency room at once. NOTE: This medicine is only for you. Do not share this medicine with others. What if I miss a dose? It is  important not to miss your dose. Call your doctor or health care professional if you are unable to keep an appointment. What may interact with this medicine? Do not take this medicine with any of the following medications:  other medicines containing denosumab This medicine may also interact with the following medications:  medicines that lower your chance of fighting infection  steroid medicines like prednisone or cortisone This list may not describe all possible interactions. Give your health care provider a list of all the medicines, herbs, non-prescription drugs, or dietary supplements you use. Also tell them if you smoke, drink alcohol, or use illegal drugs. Some items may interact with your medicine. What should I watch for while using this medicine? Visit your doctor or health care professional for regular checks on your progress. Your doctor or health care professional may order blood tests and other tests to see how you are doing. Call your doctor or health care professional for advice if you get a fever, chills or sore throat, or other symptoms of a cold or flu. Do not treat yourself. This drug may decrease your body's ability to fight infection. Try to avoid being around people who are sick. You should make sure you get enough calcium and vitamin D while you are taking this medicine, unless your doctor tells you not to. Discuss the foods you eat and the vitamins you take with your health care professional. See your dentist regularly. Brush and floss your teeth as directed. Before you have any dental work done, tell your dentist you are   receiving this medicine. Do not become pregnant while taking this medicine or for 5 months after stopping it. Talk with your doctor or health care professional about your birth control options while taking this medicine. Women should inform their doctor if they wish to become pregnant or think they might be pregnant. There is a potential for serious side  effects to an unborn child. Talk to your health care professional or pharmacist for more information. What side effects may I notice from receiving this medicine? Side effects that you should report to your doctor or health care professional as soon as possible:  allergic reactions like skin rash, itching or hives, swelling of the face, lips, or tongue  bone pain  breathing problems  dizziness  jaw pain, especially after dental work  redness, blistering, peeling of the skin  signs and symptoms of infection like fever or chills; cough; sore throat; pain or trouble passing urine  signs of low calcium like fast heartbeat, muscle cramps or muscle pain; pain, tingling, numbness in the hands or feet; seizures  unusual bleeding or bruising  unusually weak or tired Side effects that usually do not require medical attention (report to your doctor or health care professional if they continue or are bothersome):  constipation  diarrhea  headache  joint pain  loss of appetite  muscle pain  runny nose  tiredness  upset stomach This list may not describe all possible side effects. Call your doctor for medical advice about side effects. You may report side effects to FDA at 1-800-FDA-1088. Where should I keep my medicine? This medicine is only given in a clinic, doctor's office, or other health care setting and will not be stored at home. NOTE: This sheet is a summary. It may not cover all possible information. If you have questions about this medicine, talk to your doctor, pharmacist, or health care provider.  2020 Elsevier/Gold Standard (2017-09-20 16:10:44)    Influenza, Adult Influenza is also called "the flu." It is an infection in the lungs, nose, and throat (respiratory tract). It is caused by a virus. The flu causes symptoms that are similar to symptoms of a cold. It also causes a high fever and body aches. The flu spreads easily from person to person (is contagious).  Getting a flu shot (influenza vaccination) every year is the best way to prevent the flu. What are the causes? This condition is caused by the influenza virus. You can get the virus by:  Breathing in droplets that are in the air from the cough or sneeze of a person who has the virus.  Touching something that has the virus on it (is contaminated) and then touching your mouth, nose, or eyes. What increases the risk? Certain things may make you more likely to get the flu. These include:  Not washing your hands often.  Having close contact with many people during cold and flu season.  Touching your mouth, eyes, or nose without first washing your hands.  Not getting a flu shot every year. You may have a higher risk for the flu, along with serious problems such as a lung infection (pneumonia), if you:  Are older than 65.  Are pregnant.  Have a weakened disease-fighting system (immune system) because of a disease or taking certain medicines.  Have a long-term (chronic) illness, such as: ? Heart, kidney, or lung disease. ? Diabetes. ? Asthma.  Have a liver disorder.  Are very overweight (morbidly obese).  Have anemia. This is a condition that  affects your red blood cells. What are the signs or symptoms? Symptoms usually begin suddenly and last 4-14 days. They may include:  Fever and chills.  Headaches, body aches, or muscle aches.  Sore throat.  Cough.  Runny or stuffy (congested) nose.  Chest discomfort.  Not wanting to eat as much as normal (poor appetite).  Weakness or feeling tired (fatigue).  Dizziness.  Feeling sick to your stomach (nauseous) or throwing up (vomiting). How is this treated? If the flu is found early, you can be treated with medicine that can help reduce how bad the illness is and how long it lasts (antiviral medicine). This may be given by mouth (orally) or through an IV tube. Taking care of yourself at home can help your symptoms get better.  Your doctor may suggest:  Taking over-the-counter medicines.  Drinking plenty of fluids. The flu often goes away on its own. If you have very bad symptoms or other problems, you may be treated in a hospital. Follow these instructions at home:     Activity  Rest as needed. Get plenty of sleep.  Stay home from work or school as told by your doctor. ? Do not leave home until you do not have a fever for 24 hours without taking medicine. ? Leave home only to visit your doctor. Eating and drinking  Take an ORS (oral rehydration solution). This is a drink that is sold at pharmacies and stores.  Drink enough fluid to keep your pee (urine) pale yellow.  Drink clear fluids in small amounts as you are able. Clear fluids include: ? Water. ? Ice chips. ? Fruit juice that has water added (diluted fruit juice). ? Low-calorie sports drinks.  Eat bland, easy-to-digest foods in small amounts as you are able. These foods include: ? Bananas. ? Applesauce. ? Rice. ? Lean meats. ? Toast. ? Crackers.  Do not eat or drink: ? Fluids that have a lot of sugar or caffeine. ? Alcohol. ? Spicy or fatty foods. General instructions  Take over-the-counter and prescription medicines only as told by your doctor.  Use a cool mist humidifier to add moisture to the air in your home. This can make it easier for you to breathe.  Cover your mouth and nose when you cough or sneeze.  Wash your hands with soap and water often, especially after you cough or sneeze. If you cannot use soap and water, use alcohol-based hand sanitizer.  Keep all follow-up visits as told by your doctor. This is important. How is this prevented?   Get a flu shot every year. You may get the flu shot in late summer, fall, or winter. Ask your doctor when you should get your flu shot.  Avoid contact with people who are sick during fall and winter (cold and flu season). Contact a doctor if:  You get new symptoms.  You  have: ? Chest pain. ? Watery poop (diarrhea). ? A fever.  Your cough gets worse.  You start to have more mucus.  You feel sick to your stomach.  You throw up. Get help right away if you:  Have shortness of breath.  Have trouble breathing.  Have skin or nails that turn a bluish color.  Have very bad pain or stiffness in your neck.  Get a sudden headache.  Get sudden pain in your face or ear.  Cannot eat or drink without throwing up. Summary  Influenza ("the flu") is an infection in the lungs, nose, and throat. It is  caused by a virus.  Take over-the-counter and prescription medicines only as told by your doctor.  Getting a flu shot every year is the best way to avoid getting the flu. This information is not intended to replace advice given to you by your health care provider. Make sure you discuss any questions you have with your health care provider. Document Revised: 10/30/2017 Document Reviewed: 10/30/2017 Elsevier Patient Education  [place].

## 2020-02-25 DIAGNOSIS — C50811 Malignant neoplasm of overlapping sites of right female breast: Secondary | ICD-10-CM | POA: Diagnosis not present

## 2020-02-25 DIAGNOSIS — R69 Illness, unspecified: Secondary | ICD-10-CM | POA: Diagnosis not present

## 2020-02-26 DIAGNOSIS — S22000A Wedge compression fracture of unspecified thoracic vertebra, initial encounter for closed fracture: Secondary | ICD-10-CM | POA: Diagnosis not present

## 2020-02-26 DIAGNOSIS — R69 Illness, unspecified: Secondary | ICD-10-CM | POA: Diagnosis not present

## 2020-02-26 DIAGNOSIS — C50811 Malignant neoplasm of overlapping sites of right female breast: Secondary | ICD-10-CM | POA: Diagnosis not present

## 2020-02-26 DIAGNOSIS — J9621 Acute and chronic respiratory failure with hypoxia: Secondary | ICD-10-CM | POA: Diagnosis not present

## 2020-02-29 DIAGNOSIS — R0602 Shortness of breath: Secondary | ICD-10-CM | POA: Diagnosis not present

## 2020-02-29 DIAGNOSIS — D649 Anemia, unspecified: Secondary | ICD-10-CM | POA: Diagnosis not present

## 2020-02-29 DIAGNOSIS — C50811 Malignant neoplasm of overlapping sites of right female breast: Secondary | ICD-10-CM | POA: Diagnosis not present

## 2020-02-29 DIAGNOSIS — C7951 Secondary malignant neoplasm of bone: Secondary | ICD-10-CM | POA: Diagnosis not present

## 2020-02-29 DIAGNOSIS — C7801 Secondary malignant neoplasm of right lung: Secondary | ICD-10-CM | POA: Diagnosis not present

## 2020-02-29 DIAGNOSIS — G893 Neoplasm related pain (acute) (chronic): Secondary | ICD-10-CM | POA: Diagnosis not present

## 2020-02-29 DIAGNOSIS — J90 Pleural effusion, not elsewhere classified: Secondary | ICD-10-CM | POA: Diagnosis not present

## 2020-02-29 DIAGNOSIS — R69 Illness, unspecified: Secondary | ICD-10-CM | POA: Diagnosis not present

## 2020-03-01 DIAGNOSIS — C7801 Secondary malignant neoplasm of right lung: Secondary | ICD-10-CM | POA: Diagnosis not present

## 2020-03-01 DIAGNOSIS — J90 Pleural effusion, not elsewhere classified: Secondary | ICD-10-CM | POA: Diagnosis not present

## 2020-03-01 DIAGNOSIS — R69 Illness, unspecified: Secondary | ICD-10-CM | POA: Diagnosis not present

## 2020-03-01 DIAGNOSIS — D649 Anemia, unspecified: Secondary | ICD-10-CM | POA: Diagnosis not present

## 2020-03-01 DIAGNOSIS — C50811 Malignant neoplasm of overlapping sites of right female breast: Secondary | ICD-10-CM | POA: Diagnosis not present

## 2020-03-01 DIAGNOSIS — R0602 Shortness of breath: Secondary | ICD-10-CM | POA: Diagnosis not present

## 2020-03-01 DIAGNOSIS — C7951 Secondary malignant neoplasm of bone: Secondary | ICD-10-CM | POA: Diagnosis not present

## 2020-03-01 DIAGNOSIS — G893 Neoplasm related pain (acute) (chronic): Secondary | ICD-10-CM | POA: Diagnosis not present

## 2020-03-03 MED FILL — IBRANCE 100 MG TABS: 100 | 28 days supply | Qty: 21 | Fill #0

## 2020-03-04 DIAGNOSIS — R69 Illness, unspecified: Secondary | ICD-10-CM | POA: Diagnosis not present

## 2020-03-04 DIAGNOSIS — J90 Pleural effusion, not elsewhere classified: Secondary | ICD-10-CM | POA: Diagnosis not present

## 2020-03-04 DIAGNOSIS — R0602 Shortness of breath: Secondary | ICD-10-CM | POA: Diagnosis not present

## 2020-03-04 DIAGNOSIS — C7801 Secondary malignant neoplasm of right lung: Secondary | ICD-10-CM | POA: Diagnosis not present

## 2020-03-04 DIAGNOSIS — C7951 Secondary malignant neoplasm of bone: Secondary | ICD-10-CM | POA: Diagnosis not present

## 2020-03-04 DIAGNOSIS — C50811 Malignant neoplasm of overlapping sites of right female breast: Secondary | ICD-10-CM | POA: Diagnosis not present

## 2020-03-04 DIAGNOSIS — D649 Anemia, unspecified: Secondary | ICD-10-CM | POA: Diagnosis not present

## 2020-03-04 DIAGNOSIS — G893 Neoplasm related pain (acute) (chronic): Secondary | ICD-10-CM | POA: Diagnosis not present

## 2020-03-08 ENCOUNTER — Other Ambulatory Visit: Payer: Self-pay

## 2020-03-08 DIAGNOSIS — C7801 Secondary malignant neoplasm of right lung: Secondary | ICD-10-CM | POA: Diagnosis not present

## 2020-03-08 DIAGNOSIS — R0602 Shortness of breath: Secondary | ICD-10-CM | POA: Diagnosis not present

## 2020-03-08 DIAGNOSIS — C7951 Secondary malignant neoplasm of bone: Secondary | ICD-10-CM | POA: Diagnosis not present

## 2020-03-08 DIAGNOSIS — G893 Neoplasm related pain (acute) (chronic): Secondary | ICD-10-CM | POA: Diagnosis not present

## 2020-03-08 DIAGNOSIS — C50811 Malignant neoplasm of overlapping sites of right female breast: Secondary | ICD-10-CM

## 2020-03-08 DIAGNOSIS — J90 Pleural effusion, not elsewhere classified: Secondary | ICD-10-CM | POA: Diagnosis not present

## 2020-03-08 DIAGNOSIS — Z170 Estrogen receptor positive status [ER+]: Secondary | ICD-10-CM

## 2020-03-08 DIAGNOSIS — D649 Anemia, unspecified: Secondary | ICD-10-CM | POA: Diagnosis not present

## 2020-03-08 DIAGNOSIS — R69 Illness, unspecified: Secondary | ICD-10-CM | POA: Diagnosis not present

## 2020-03-09 DIAGNOSIS — C7951 Secondary malignant neoplasm of bone: Secondary | ICD-10-CM | POA: Diagnosis not present

## 2020-03-09 DIAGNOSIS — C50811 Malignant neoplasm of overlapping sites of right female breast: Secondary | ICD-10-CM | POA: Diagnosis not present

## 2020-03-09 DIAGNOSIS — D649 Anemia, unspecified: Secondary | ICD-10-CM | POA: Diagnosis not present

## 2020-03-09 DIAGNOSIS — C7801 Secondary malignant neoplasm of right lung: Secondary | ICD-10-CM | POA: Diagnosis not present

## 2020-03-09 DIAGNOSIS — R0602 Shortness of breath: Secondary | ICD-10-CM | POA: Diagnosis not present

## 2020-03-09 DIAGNOSIS — G893 Neoplasm related pain (acute) (chronic): Secondary | ICD-10-CM | POA: Diagnosis not present

## 2020-03-09 DIAGNOSIS — R69 Illness, unspecified: Secondary | ICD-10-CM | POA: Diagnosis not present

## 2020-03-09 DIAGNOSIS — J90 Pleural effusion, not elsewhere classified: Secondary | ICD-10-CM | POA: Diagnosis not present

## 2020-03-10 DIAGNOSIS — C7801 Secondary malignant neoplasm of right lung: Secondary | ICD-10-CM | POA: Diagnosis not present

## 2020-03-10 DIAGNOSIS — C7951 Secondary malignant neoplasm of bone: Secondary | ICD-10-CM | POA: Diagnosis not present

## 2020-03-10 DIAGNOSIS — C50811 Malignant neoplasm of overlapping sites of right female breast: Secondary | ICD-10-CM | POA: Diagnosis not present

## 2020-03-10 DIAGNOSIS — R0602 Shortness of breath: Secondary | ICD-10-CM | POA: Diagnosis not present

## 2020-03-10 DIAGNOSIS — J90 Pleural effusion, not elsewhere classified: Secondary | ICD-10-CM | POA: Diagnosis not present

## 2020-03-10 DIAGNOSIS — D649 Anemia, unspecified: Secondary | ICD-10-CM | POA: Diagnosis not present

## 2020-03-10 DIAGNOSIS — G893 Neoplasm related pain (acute) (chronic): Secondary | ICD-10-CM | POA: Diagnosis not present

## 2020-03-10 DIAGNOSIS — R69 Illness, unspecified: Secondary | ICD-10-CM | POA: Diagnosis not present

## 2020-03-11 DIAGNOSIS — C7951 Secondary malignant neoplasm of bone: Secondary | ICD-10-CM | POA: Diagnosis not present

## 2020-03-11 DIAGNOSIS — C7801 Secondary malignant neoplasm of right lung: Secondary | ICD-10-CM | POA: Diagnosis not present

## 2020-03-11 DIAGNOSIS — R69 Illness, unspecified: Secondary | ICD-10-CM | POA: Diagnosis not present

## 2020-03-11 DIAGNOSIS — R0602 Shortness of breath: Secondary | ICD-10-CM | POA: Diagnosis not present

## 2020-03-11 DIAGNOSIS — J90 Pleural effusion, not elsewhere classified: Secondary | ICD-10-CM | POA: Diagnosis not present

## 2020-03-11 DIAGNOSIS — G893 Neoplasm related pain (acute) (chronic): Secondary | ICD-10-CM | POA: Diagnosis not present

## 2020-03-11 DIAGNOSIS — D649 Anemia, unspecified: Secondary | ICD-10-CM | POA: Diagnosis not present

## 2020-03-11 DIAGNOSIS — C50811 Malignant neoplasm of overlapping sites of right female breast: Secondary | ICD-10-CM | POA: Diagnosis not present

## 2020-03-17 DIAGNOSIS — C7801 Secondary malignant neoplasm of right lung: Secondary | ICD-10-CM | POA: Diagnosis not present

## 2020-03-17 DIAGNOSIS — R0602 Shortness of breath: Secondary | ICD-10-CM | POA: Diagnosis not present

## 2020-03-17 DIAGNOSIS — D649 Anemia, unspecified: Secondary | ICD-10-CM | POA: Diagnosis not present

## 2020-03-17 DIAGNOSIS — G893 Neoplasm related pain (acute) (chronic): Secondary | ICD-10-CM | POA: Diagnosis not present

## 2020-03-17 DIAGNOSIS — R69 Illness, unspecified: Secondary | ICD-10-CM | POA: Diagnosis not present

## 2020-03-17 DIAGNOSIS — J90 Pleural effusion, not elsewhere classified: Secondary | ICD-10-CM | POA: Diagnosis not present

## 2020-03-17 DIAGNOSIS — C7951 Secondary malignant neoplasm of bone: Secondary | ICD-10-CM | POA: Diagnosis not present

## 2020-03-17 DIAGNOSIS — C50811 Malignant neoplasm of overlapping sites of right female breast: Secondary | ICD-10-CM | POA: Diagnosis not present

## 2020-03-18 ENCOUNTER — Telehealth: Payer: Self-pay

## 2020-03-18 DIAGNOSIS — R69 Illness, unspecified: Secondary | ICD-10-CM | POA: Diagnosis not present

## 2020-03-18 DIAGNOSIS — R0602 Shortness of breath: Secondary | ICD-10-CM | POA: Diagnosis not present

## 2020-03-18 DIAGNOSIS — C7801 Secondary malignant neoplasm of right lung: Secondary | ICD-10-CM | POA: Diagnosis not present

## 2020-03-18 DIAGNOSIS — D649 Anemia, unspecified: Secondary | ICD-10-CM | POA: Diagnosis not present

## 2020-03-18 DIAGNOSIS — C50811 Malignant neoplasm of overlapping sites of right female breast: Secondary | ICD-10-CM | POA: Diagnosis not present

## 2020-03-18 DIAGNOSIS — J90 Pleural effusion, not elsewhere classified: Secondary | ICD-10-CM | POA: Diagnosis not present

## 2020-03-18 DIAGNOSIS — C7951 Secondary malignant neoplasm of bone: Secondary | ICD-10-CM | POA: Diagnosis not present

## 2020-03-18 DIAGNOSIS — G893 Neoplasm related pain (acute) (chronic): Secondary | ICD-10-CM | POA: Diagnosis not present

## 2020-03-18 NOTE — Telephone Encounter (Signed)
Pt's daughter called stating pharmacy mailed her anastrazole but it has been lost in the mail and asks if it is OK for pt to miss a few doses until it has been located/received. This is OK and daughter understands and states she will call and lvm when it has arrived.

## 2020-03-21 DIAGNOSIS — C7951 Secondary malignant neoplasm of bone: Secondary | ICD-10-CM | POA: Diagnosis not present

## 2020-03-21 DIAGNOSIS — R69 Illness, unspecified: Secondary | ICD-10-CM | POA: Diagnosis not present

## 2020-03-21 DIAGNOSIS — C50811 Malignant neoplasm of overlapping sites of right female breast: Secondary | ICD-10-CM | POA: Diagnosis not present

## 2020-03-21 DIAGNOSIS — J90 Pleural effusion, not elsewhere classified: Secondary | ICD-10-CM | POA: Diagnosis not present

## 2020-03-21 DIAGNOSIS — D649 Anemia, unspecified: Secondary | ICD-10-CM | POA: Diagnosis not present

## 2020-03-21 DIAGNOSIS — G893 Neoplasm related pain (acute) (chronic): Secondary | ICD-10-CM | POA: Diagnosis not present

## 2020-03-21 DIAGNOSIS — R0602 Shortness of breath: Secondary | ICD-10-CM | POA: Diagnosis not present

## 2020-03-21 DIAGNOSIS — C7801 Secondary malignant neoplasm of right lung: Secondary | ICD-10-CM | POA: Diagnosis not present

## 2020-03-22 ENCOUNTER — Telehealth: Payer: Self-pay

## 2020-03-22 ENCOUNTER — Other Ambulatory Visit: Payer: Medicaid Other

## 2020-03-22 ENCOUNTER — Ambulatory Visit: Payer: Medicaid Other

## 2020-03-22 DIAGNOSIS — R69 Illness, unspecified: Secondary | ICD-10-CM | POA: Diagnosis not present

## 2020-03-22 DIAGNOSIS — G893 Neoplasm related pain (acute) (chronic): Secondary | ICD-10-CM | POA: Diagnosis not present

## 2020-03-22 DIAGNOSIS — R0602 Shortness of breath: Secondary | ICD-10-CM | POA: Diagnosis not present

## 2020-03-22 DIAGNOSIS — C7801 Secondary malignant neoplasm of right lung: Secondary | ICD-10-CM | POA: Diagnosis not present

## 2020-03-22 DIAGNOSIS — J90 Pleural effusion, not elsewhere classified: Secondary | ICD-10-CM | POA: Diagnosis not present

## 2020-03-22 DIAGNOSIS — D649 Anemia, unspecified: Secondary | ICD-10-CM | POA: Diagnosis not present

## 2020-03-22 DIAGNOSIS — C7951 Secondary malignant neoplasm of bone: Secondary | ICD-10-CM | POA: Diagnosis not present

## 2020-03-22 DIAGNOSIS — C50811 Malignant neoplasm of overlapping sites of right female breast: Secondary | ICD-10-CM | POA: Diagnosis not present

## 2020-03-22 NOTE — Telephone Encounter (Signed)
Pt called today stating she will need more time with Wilber Bihari, NP during her visit on 12/27 as she is experiencing LUE pain where she broke her arm. Pt denies redness, swelling, heat and states it only hurts when she moves it a certain way. Explained to pt that Mendel Ryder would evaluate this tomorrow, but if pain is unbearable she should go to ED for further evaluation. Pt states she will wait for her appt with LC.

## 2020-03-23 ENCOUNTER — Encounter: Payer: Self-pay | Admitting: General Practice

## 2020-03-23 ENCOUNTER — Inpatient Hospital Stay: Payer: Medicaid Other

## 2020-03-23 ENCOUNTER — Other Ambulatory Visit: Payer: Self-pay

## 2020-03-23 ENCOUNTER — Inpatient Hospital Stay: Payer: Medicaid Other | Attending: Adult Health | Admitting: Adult Health

## 2020-03-23 ENCOUNTER — Encounter: Payer: Self-pay | Admitting: Adult Health

## 2020-03-23 VITALS — BP 144/84 | HR 73 | Temp 98.1°F | Resp 18

## 2020-03-23 VITALS — BP 126/77 | HR 77 | Temp 98.3°F | Resp 18 | Ht 59.0 in | Wt 201.9 lb

## 2020-03-23 DIAGNOSIS — C50811 Malignant neoplasm of overlapping sites of right female breast: Secondary | ICD-10-CM

## 2020-03-23 DIAGNOSIS — C50911 Malignant neoplasm of unspecified site of right female breast: Secondary | ICD-10-CM | POA: Diagnosis not present

## 2020-03-23 DIAGNOSIS — Z170 Estrogen receptor positive status [ER+]: Secondary | ICD-10-CM | POA: Diagnosis not present

## 2020-03-23 DIAGNOSIS — I700 Atherosclerosis of aorta: Secondary | ICD-10-CM | POA: Diagnosis not present

## 2020-03-23 DIAGNOSIS — C7951 Secondary malignant neoplasm of bone: Secondary | ICD-10-CM

## 2020-03-23 DIAGNOSIS — C7800 Secondary malignant neoplasm of unspecified lung: Secondary | ICD-10-CM | POA: Diagnosis not present

## 2020-03-23 LAB — CBC WITH DIFFERENTIAL (CANCER CENTER ONLY)
Abs Immature Granulocytes: 0.02 10*3/uL (ref 0.00–0.07)
Basophils Absolute: 0.1 10*3/uL (ref 0.0–0.1)
Basophils Relative: 3 %
Eosinophils Absolute: 0.1 10*3/uL (ref 0.0–0.5)
Eosinophils Relative: 2 %
HCT: 26.8 % — ABNORMAL LOW (ref 36.0–46.0)
Hemoglobin: 8.6 g/dL — ABNORMAL LOW (ref 12.0–15.0)
Immature Granulocytes: 1 %
Lymphocytes Relative: 29 %
Lymphs Abs: 0.8 10*3/uL (ref 0.7–4.0)
MCH: 35.4 pg — ABNORMAL HIGH (ref 26.0–34.0)
MCHC: 32.1 g/dL (ref 30.0–36.0)
MCV: 110.3 fL — ABNORMAL HIGH (ref 80.0–100.0)
Monocytes Absolute: 0.2 10*3/uL (ref 0.1–1.0)
Monocytes Relative: 6 %
Neutro Abs: 1.7 10*3/uL (ref 1.7–7.7)
Neutrophils Relative %: 59 %
Platelet Count: 269 10*3/uL (ref 150–400)
RBC: 2.43 MIL/uL — ABNORMAL LOW (ref 3.87–5.11)
RDW: 15.6 % — ABNORMAL HIGH (ref 11.5–15.5)
WBC Count: 2.8 10*3/uL — ABNORMAL LOW (ref 4.0–10.5)
nRBC: 0 % (ref 0.0–0.2)

## 2020-03-23 LAB — CMP (CANCER CENTER ONLY)
ALT: 8 U/L (ref 0–44)
AST: 14 U/L — ABNORMAL LOW (ref 15–41)
Albumin: 3.5 g/dL (ref 3.5–5.0)
Alkaline Phosphatase: 101 U/L (ref 38–126)
Anion gap: 4 — ABNORMAL LOW (ref 5–15)
BUN: 17 mg/dL (ref 8–23)
CO2: 30 mmol/L (ref 22–32)
Calcium: 9.2 mg/dL (ref 8.9–10.3)
Chloride: 104 mmol/L (ref 98–111)
Creatinine: 0.85 mg/dL (ref 0.44–1.00)
GFR, Estimated: >60 mL/min (ref >60)
Glucose, Bld: 94 mg/dL (ref 70–99)
Potassium: 4.1 mmol/L (ref 3.5–5.1)
Sodium: 138 mmol/L (ref 135–145)
Total Bilirubin: 0.4 mg/dL (ref 0.3–1.2)
Total Protein: 6.9 g/dL (ref 6.5–8.1)

## 2020-03-23 MED ORDER — DENOSUMAB 120 MG/1.7ML ~~LOC~~ SOLN
SUBCUTANEOUS | Status: AC
  Filled 2020-03-23: qty 1.7

## 2020-03-23 MED ORDER — DENOSUMAB 120 MG/1.7ML ~~LOC~~ SOLN
120.0000 mg | Freq: Once | SUBCUTANEOUS | Status: AC
  Administered 2020-03-23: 120 mg via SUBCUTANEOUS

## 2020-03-23 NOTE — Patient Instructions (Signed)
Denosumab injection What is this medicine? DENOSUMAB (den oh sue mab) slows bone breakdown. Prolia is used to treat osteoporosis in women after menopause and in men, and in people who are taking corticosteroids for 6 months or more. Xgeva is used to treat a high calcium level due to cancer and to prevent bone fractures and other bone problems caused by multiple myeloma or cancer bone metastases. Xgeva is also used to treat giant cell tumor of the bone. This medicine may be used for other purposes; ask your health care provider or pharmacist if you have questions. COMMON BRAND NAME(S): Prolia, XGEVA What should I tell my health care provider before I take this medicine? They need to know if you have any of these conditions:  dental disease  having surgery or tooth extraction  infection  kidney disease  low levels of calcium or Vitamin D in the blood  malnutrition  on hemodialysis  skin conditions or sensitivity  thyroid or parathyroid disease  an unusual reaction to denosumab, other medicines, foods, dyes, or preservatives  pregnant or trying to get pregnant  breast-feeding How should I use this medicine? This medicine is for injection under the skin. It is given by a health care professional in a hospital or clinic setting. A special MedGuide will be given to you before each treatment. Be sure to read this information carefully each time. For Prolia, talk to your pediatrician regarding the use of this medicine in children. Special care may be needed. For Xgeva, talk to your pediatrician regarding the use of this medicine in children. While this drug may be prescribed for children as young as 13 years for selected conditions, precautions do apply. Overdosage: If you think you have taken too much of this medicine contact a poison control center or emergency room at once. NOTE: This medicine is only for you. Do not share this medicine with others. What if I miss a dose? It is  important not to miss your dose. Call your doctor or health care professional if you are unable to keep an appointment. What may interact with this medicine? Do not take this medicine with any of the following medications:  other medicines containing denosumab This medicine may also interact with the following medications:  medicines that lower your chance of fighting infection  steroid medicines like prednisone or cortisone This list may not describe all possible interactions. Give your health care provider a list of all the medicines, herbs, non-prescription drugs, or dietary supplements you use. Also tell them if you smoke, drink alcohol, or use illegal drugs. Some items may interact with your medicine. What should I watch for while using this medicine? Visit your doctor or health care professional for regular checks on your progress. Your doctor or health care professional may order blood tests and other tests to see how you are doing. Call your doctor or health care professional for advice if you get a fever, chills or sore throat, or other symptoms of a cold or flu. Do not treat yourself. This drug may decrease your body's ability to fight infection. Try to avoid being around people who are sick. You should make sure you get enough calcium and vitamin D while you are taking this medicine, unless your doctor tells you not to. Discuss the foods you eat and the vitamins you take with your health care professional. See your dentist regularly. Brush and floss your teeth as directed. Before you have any dental work done, tell your dentist you are   receiving this medicine. Do not become pregnant while taking this medicine or for 5 months after stopping it. Talk with your doctor or health care professional about your birth control options while taking this medicine. Women should inform their doctor if they wish to become pregnant or think they might be pregnant. There is a potential for serious side  effects to an unborn child. Talk to your health care professional or pharmacist for more information. What side effects may I notice from receiving this medicine? Side effects that you should report to your doctor or health care professional as soon as possible:  allergic reactions like skin rash, itching or hives, swelling of the face, lips, or tongue  bone pain  breathing problems  dizziness  jaw pain, especially after dental work  redness, blistering, peeling of the skin  signs and symptoms of infection like fever or chills; cough; sore throat; pain or trouble passing urine  signs of low calcium like fast heartbeat, muscle cramps or muscle pain; pain, tingling, numbness in the hands or feet; seizures  unusual bleeding or bruising  unusually weak or tired Side effects that usually do not require medical attention (report to your doctor or health care professional if they continue or are bothersome):  constipation  diarrhea  headache  joint pain  loss of appetite  muscle pain  runny nose  tiredness  upset stomach This list may not describe all possible side effects. Call your doctor for medical advice about side effects. You may report side effects to FDA at 1-800-FDA-1088. Where should I keep my medicine? This medicine is only given in a clinic, doctor's office, or other health care setting and will not be stored at home. NOTE: This sheet is a summary. It may not cover all possible information. If you have questions about this medicine, talk to your doctor, pharmacist, or health care provider.  2020 Elsevier/Gold Standard (2017-09-20 16:10:44)    Influenza, Adult Influenza is also called "the flu." It is an infection in the lungs, nose, and throat (respiratory tract). It is caused by a virus. The flu causes symptoms that are similar to symptoms of a cold. It also causes a high fever and body aches. The flu spreads easily from person to person (is contagious).  Getting a flu shot (influenza vaccination) every year is the best way to prevent the flu. What are the causes? This condition is caused by the influenza virus. You can get the virus by:  Breathing in droplets that are in the air from the cough or sneeze of a person who has the virus.  Touching something that has the virus on it (is contaminated) and then touching your mouth, nose, or eyes. What increases the risk? Certain things may make you more likely to get the flu. These include:  Not washing your hands often.  Having close contact with many people during cold and flu season.  Touching your mouth, eyes, or nose without first washing your hands.  Not getting a flu shot every year. You may have a higher risk for the flu, along with serious problems such as a lung infection (pneumonia), if you:  Are older than 65.  Are pregnant.  Have a weakened disease-fighting system (immune system) because of a disease or taking certain medicines.  Have a long-term (chronic) illness, such as: ? Heart, kidney, or lung disease. ? Diabetes. ? Asthma.  Have a liver disorder.  Are very overweight (morbidly obese).  Have anemia. This is a condition that   affects your red blood cells. What are the signs or symptoms? Symptoms usually begin suddenly and last 4-14 days. They may include:  Fever and chills.  Headaches, body aches, or muscle aches.  Sore throat.  Cough.  Runny or stuffy (congested) nose.  Chest discomfort.  Not wanting to eat as much as normal (poor appetite).  Weakness or feeling tired (fatigue).  Dizziness.  Feeling sick to your stomach (nauseous) or throwing up (vomiting). How is this treated? If the flu is found early, you can be treated with medicine that can help reduce how bad the illness is and how long it lasts (antiviral medicine). This may be given by mouth (orally) or through an IV tube. Taking care of yourself at home can help your symptoms get better.  Your doctor may suggest:  Taking over-the-counter medicines.  Drinking plenty of fluids. The flu often goes away on its own. If you have very bad symptoms or other problems, you may be treated in a hospital. Follow these instructions at home:     Activity  Rest as needed. Get plenty of sleep.  Stay home from work or school as told by your doctor. ? Do not leave home until you do not have a fever for 24 hours without taking medicine. ? Leave home only to visit your doctor. Eating and drinking  Take an ORS (oral rehydration solution). This is a drink that is sold at pharmacies and stores.  Drink enough fluid to keep your pee (urine) pale yellow.  Drink clear fluids in small amounts as you are able. Clear fluids include: ? Water. ? Ice chips. ? Fruit juice that has water added (diluted fruit juice). ? Low-calorie sports drinks.  Eat bland, easy-to-digest foods in small amounts as you are able. These foods include: ? Bananas. ? Applesauce. ? Rice. ? Lean meats. ? Toast. ? Crackers.  Do not eat or drink: ? Fluids that have a lot of sugar or caffeine. ? Alcohol. ? Spicy or fatty foods. General instructions  Take over-the-counter and prescription medicines only as told by your doctor.  Use a cool mist humidifier to add moisture to the air in your home. This can make it easier for you to breathe.  Cover your mouth and nose when you cough or sneeze.  Wash your hands with soap and water often, especially after you cough or sneeze. If you cannot use soap and water, use alcohol-based hand sanitizer.  Keep all follow-up visits as told by your doctor. This is important. How is this prevented?   Get a flu shot every year. You may get the flu shot in late summer, fall, or winter. Ask your doctor when you should get your flu shot.  Avoid contact with people who are sick during fall and winter (cold and flu season). Contact a doctor if:  You get new symptoms.  You  have: ? Chest pain. ? Watery poop (diarrhea). ? A fever.  Your cough gets worse.  You start to have more mucus.  You feel sick to your stomach.  You throw up. Get help right away if you:  Have shortness of breath.  Have trouble breathing.  Have skin or nails that turn a bluish color.  Have very bad pain or stiffness in your neck.  Get a sudden headache.  Get sudden pain in your face or ear.  Cannot eat or drink without throwing up. Summary  Influenza ("the flu") is an infection in the lungs, nose, and throat. It is  caused by a virus.  Take over-the-counter and prescription medicines only as told by your doctor.  Getting a flu shot every year is the best way to avoid getting the flu. This information is not intended to replace advice given to you by your health care provider. Make sure you discuss any questions you have with your health care provider. Document Revised: 10/30/2017 Document Reviewed: 10/30/2017 Elsevier Patient Education  [place].

## 2020-03-23 NOTE — Progress Notes (Signed)
[facility] CSW Progress Notes  Call from patient - she is here and requests [employer] bus passes and [employer].  CSW provided two [employer] ride passes - advised that these may not be valid any longer as service has changed.  Provided second [employer].  Items left w Web designer in [place], [place] Worker Phone:  [phone]

## 2020-03-23 NOTE — Progress Notes (Signed)
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  Her most recent restaging chest CT on 12/24/2019 showing: no significant change in 1.6 cm left axillary lymph node, no other enlarged lymph nodes in chest; diffuse lytic and sclerotic osseous metastatic disease throughout all included osseous structures, including high-grade wedge deformity of T12; no Suzanne evidence of metastatic disease in chest; bandlike scarring of right lung; moderate hiatal hernia with intrathoracic position of gastric fundus.  [name] continues on anastrozole.  She tolerates it well.  She has missed a few doses of this due to it being lost in the mail.  She is scheduled to pick it up on Thursday.    She is also on palbociclib stably at 100 mg daily 21 days on 7 days off.  She is in her third week today.  She is due to stop in one week.    She receives [place] every 4 weeks. She has no issues with receiving this, and denies any dental concerns.       REVIEW OF SYSTEMS: Suzanne [name] has had left shoulder pain x 2 days.  She was moving her arm and then developed significant pain.  She is placing icy hot on it and taking tylenol.  She denies any fever or chills.  She has no bowel/bladder changes, headaches, vision issues.  She is active throughout her house.  She is eating and drinking well.  She is not having any cough, chest pain, or increased shortness of breath.  She continues on oxygen  chronically at 2 liters per nasal cannula and is stable on this amount.  She has no issues of increased need.  A detailed ROS was otherwise non contributory.    HISTORY OF CURRENT ILLNESS: From the original inpatient consult note:  Suzanne [name] is a 59 year old female [name] without significant past medical history with exception of obesity, remote history of gallstones, and remote history of mastitis.  The patient presented to the hospital on 10/26/2018 with a 3-week history of worsening shortness of breath and lower extremity edema.  The patient reported that her shortness of breath worsened with exertion.  Her lower extremity edema did not improve with elevation.  She also had an intermittent pr oductive cough with rare sputum production.  The patient was noted to be anemic with a hemoglobin of 4.5 with a clumped platelets noted on admission.  Stool for occult blood was positive.  Patient received 2 units of packed red blood cells.    The patient reported a rash under her breast x1 week which has been foul-smelling.  She also reported that she has some discomfort and thickening/hardness of her right breast for several months.  She states that her right breast is much larger than her left.  She notes some occasional bloody discharge from her breast once in a while.  A chest x-ray on admission showed diffuse bilateral interstitial opacity of unknown chronicity, findings could be related to interstitial inflammatory process, atypical/viral pneumonia, or possible metastatic disease.  There are also  sclerotic and lytic lesions within the clavicles, bilateral ribs, shoulders which was concerning for diffuse few skeletal metastatic disease.    This prompted a CT scan of the chest with contrast which showed bilateral axillary and mediastinal adenopathy, borderline hilar lymph nodes bilaterally, findings likely flecked metastatic lymphadenopathy, moderate bilateral pleural effusions,  extensive interstitial thickening and reticulonodular opacities throughout the lungs, cannot exclude lymphangitic spread of tumor, diffuse skeletal metastases.  The patient's subsequent history is as detailed below.   PAST MEDICAL HISTORY: Past Medical History:  Diagnosis Date   Cancer [facility])    Family history of breast cancer    Family history of colon cancer    Family history of melanoma    Family history of prostate cancer    Gallstones    Glaucoma    Mastitis    reports history of recurrent mastitis   Metastatic breast cancer ([place])    Obesity     PAST SURGICAL HISTORY: Past Surgical History:  Procedure Laterality Date   GLAUCOMA SURGERY  late 1990's   IR THORACENTESIS ASP PLEURAL SPACE W/IMG GUIDE  10/28/2018   IR THORACENTESIS ASP PLEURAL SPACE W/IMG GUIDE  10/29/2018   OPEN REDUCTION INTERNAL FIXATION (ORIF) DISTAL RADIAL FRACTURE Left 01/09/2019   Procedure: OPEN REDUCTION INTERNAL FIXATION (ORIF) HUMERAL FRACTURE;  Surgeon: Netta Cedars, MD;  Location: [facility];  Service: Orthopedics;  Laterality: Left;    FAMILY HISTORY: Family History  Problem Relation Age of Onset   Breast cancer Sister        in her 1's-70s   Heart disease Brother        CABG   Heart disease Brother        CABG   Skin cancer Brother        melanoma   Colon cancer Cousin    Heart attack Mother    Heart attack Father    Prostate cancer Brother   Patient's father passed away at age 37, and her mother at 77, both from heart attacks. The patient has 8 siblings, 6 brothers and 1 sister.  Her sister was diagnosed with breast cancer. One brother has prostate cancer, another brother had a melanoma removed.    GYNECOLOGIC HISTORY:  Menarche: 59 years old Age at first live birth: 59 years old [place] P 1 LMP age 1 Contraceptive: used for 2-3 years with no problems HRT never used  Hysterectomy? no   SOCIAL HISTORY:  Suzanne [name] worked for an Engineer, manufacturing systems. She is  widowed. She lives at home with her daughter.  Suzanne [name], 37, who is a Psychologist, occupational at [employer] center.  The patient has no grandchildren. She is not a Ambulance person.   ADVANCED DIRECTIVES: not on file; she has the paperwork already. She intends to name her daughter as her HCPOA.   HEALTH MAINTENANCE: Social History   Tobacco Use   Smoking status: Never Smoker   Smokeless tobacco: Never Used   Tobacco comment: second hand smoke exposure  Vaping Use   Vaping Use: Never used  Substance Use Topics   Alcohol use: No   Drug use: No    Colonoscopy:   PAP:   Bone density:  Mammography:   No Known Allergies  Current Outpatient Medications  Medication Sig Dispense Refill   albuterol (VENTOLIN HFA) 108 (90 Base) MCG/ACT inhaler Inhale 2 puffs into the lungs every 6 (six) hours as needed for wheezing or shortness of breath. 16 g 3   ALPRAZolam (XANAX) 0.25 MG tablet Take 1-2 tablets (  0.25-0.5 mg total) by mouth every 8 (eight) hours as needed for anxiety. Prescription needed for SNF placement 5 tablet 0   anastrozole (ARIMIDEX) 1 MG tablet Take 1 tablet (1 mg total) by mouth daily. 30 tablet 3   antiseptic oral rinse (BIOTENE) LIQD 15 mLs by Mouth Rinse route as needed for dry mouth.     benzonatate (TESSALON) 200 MG capsule TAKE 1 CAPSULE BY MOUTH 3 TIMES DAILY IF NEEDED 30 capsule 0   Blood Pressure Monitor MISC Use to check blood pressure twice per day and more often if not feeling well 1 each 0   calcium-vitamin D (OSCAL WITH D) 500-200 MG-UNIT tablet Take 1 tablet by mouth 3 (three) times daily. 90 tablet 1   fluconazole (DIFLUCAN) 100 MG tablet Take 1 tablet (100 mg total) by mouth daily. Take medication for 10 days and then take only as needed. 90 tablet 3   furosemide (LASIX) 20 MG tablet Take 1 tablet (20 mg total) by mouth daily. 90 tablet 4   IBRANCE 100 MG tablet TAKE 1 TABLET (100 MG TOTAL) BY MOUTH DAILY. TAKE FOR 21 DAYS ON, 7 DAYS OFF, REPEAT EVERY 28 DAYS.  21 tablet 1   ketoconazole (NIZORAL) 2 % cream Apply to affected areas daily as needed 30 g 6   loratadine (CLARITIN) 10 MG tablet Take 1 tablet (10 mg total) by mouth daily. 30 tablet 3   polyethylene glycol (MIRALAX / GLYCOLAX) 17 g packet Take 17 g by mouth as needed for constipation.     sertraline (ZOLOFT) 50 MG tablet Two pills once per day at bedtime 180 tablet 0   ZOFRAN 8 MG tablet Take 8 mg by mouth every 8 (eight) hours.     carvedilol (COREG) 3.125 MG tablet Take 1 tablet (3.125 mg total) by mouth 2 (two) times daily with a meal. 60 tablet 0   No current facility-administered medications for this visit.    OBJECTIVE:   Vitals:   03/23/20 1419  BP: 126/77  Pulse: 77  Resp: 18  Temp: 98.3 F (36.8 C)  SpO2: 94%     Body mass index is 40.78 kg/m.   Wt Readings from Last 3 Encounters:  03/23/20 201 lb 14.4 oz (91.6 kg)  12/29/19 191 lb 1.6 oz (86.7 kg)  10/06/19 182 lb 6.4 oz (82.7 kg)  ECOG FS:3 GENERAL: Patient is a well appearing female in no acute distress HEENT:  Sclerae anicteric.  Mask in place Neck is supple.  NODES:  No cervical, supraclavicular, or axillary lymphadenopathy palpated.  BREAST EXAM:  Deferred. LUNGS:  Clear to auscultation bilaterally.  No wheezes or rhonchi. HEART:  Regular rate and rhythm. No murmur appreciated. ABDOMEN:  Soft, nontender.  Positive, normoactive bowel sounds. No organomegaly palpated. MSK:  No focal spinal tenderness to palpation. Full range of motion bilaterally in the upper extremities. EXTREMITIES:  No peripheral edema.   SKIN:  Clear with no obvious rashes or skin changes. No nail dyscrasia. NEURO:  Nonfocal. Well oriented.  Appropriate affect.   LAB RESULTS:  CMP     Component Value Date/Time   NA 138 03/23/2020 1347   K 4.1 03/23/2020 1347   CL 104 03/23/2020 1347   CO2 30 03/23/2020 1347   GLUCOSE 94 03/23/2020 1347   BUN 17 03/23/2020 1347   CREATININE 0.85 03/23/2020 1347   CALCIUM 9.2 03/23/2020  1347   PROT 6.9 03/23/2020 1347   ALBUMIN 3.5 03/23/2020 1347   AST 14 (L) 03/23/2020 1347  ALT 8 03/23/2020 1347   ALKPHOS 101 03/23/2020 1347   BILITOT 0.4 03/23/2020 1347   GFRNONAA >60 03/23/2020 1347   GFRAA >60 02/24/2020 1454   GFRAA >60 07/24/2019 1453    No results found for: TOTALPROTELP, ALBUMINELP, A1GS, A2GS, BETS, BETA2SER, GAMS, MSPIKE, SPEI  No results found for: KPAFRELGTCHN, LAMBDASER, KAPLAMBRATIO  Lab Results  Component Value Date   WBC 2.8 (L) 03/23/2020   NEUTROABS 1.7 03/23/2020   HGB 8.6 (L) 03/23/2020   HCT 26.8 (L) 03/23/2020   MCV 110.3 (H) 03/23/2020   PLT 269 03/23/2020   No results found for: LABCA2  No components found for: WVPXTG626  No results for input(s): INR in the last 168 hours.  No results found for: LABCA2  No results found for: RSW546  No results found for: EVO350  No results found for: KXF818  Lab Results  Component Value Date   CA2729 22.3 01/26/2020    No components found for: HGQUANT  No results found for: CEA1 / No results found for: CEA1   No results found for: AFPTUMOR  No results found for: CHROMOGRNA  No results found for: HGBA, HGBA2QUANT, HGBFQUANT, HGBSQUAN (Hemoglobinopathy evaluation)   Lab Results  Component Value Date   LDH 244 (H) 12/25/2018    Lab Results  Component Value Date   IRON 137 10/26/2018   TIBC 384 10/26/2018   IRONPCTSAT 36 (H) 10/26/2018   (Iron and TIBC)  Lab Results  Component Value Date   FERRITIN 2,306 (H) 12/25/2018    Urinalysis    Component Value Date/Time   COLORURINE STRAW (A) 01/15/2019 1150   APPEARANCEUR CLEAR 01/15/2019 1150   LABSPEC 1.008 01/15/2019 1150   PHURINE 6.0 01/15/2019 1150   GLUCOSEU NEGATIVE 01/15/2019 [place] 01/15/2019 [place] 01/15/2019 1150   KETONESUR NEGATIVE 01/15/2019 1150   PROTEINUR NEGATIVE 01/15/2019 1150   NITRITE NEGATIVE 01/15/2019 1150   LEUKOCYTESUR TRACE (A) 01/15/2019 1150     STUDIES:  No results found.   ELIGIBLE FOR AVAILABLE RESEARCH PROTOCOL: no   ASSESSMENT: 59 y.o. [place], [place] woman presenting 10/26/2018 with right-sided inflammatory breast cancer, stage IV, involvnh lungs, lymph nodes and bones, as follows:  (a) chest CT scan 10/26/2018 shows bilateral pleural effusions, possible lymphangitic spread of tumor, diffuse bony metastatic disease, and significant axillary mediastinal and hilar adenopathy  (b) bone scan 10/27/2018 is a "near [place]" consistent with widespread bony metastatic disease  (c) head CT with and without contrast 10/30/2018 shows no intracranial metastatic disease, multiple calvarial lesions  (d) CA-27-29 on 10/27/2018 was 1810.4  (1) pleural fluid from right thoracentesis 10/28/2018 confirms malignant cells consistent with a breast primary, strongly estrogen and progesterone receptor positive, HER-2 not amplified, with an MIB-1 of 2%  (2) anastrozole started 10/29/2018  (a) palbociclib to start 11/13/2018 at 100 mg dose  (b) denosumab/xgeva started 11/13/2018   (3) associated problems:  (a) hypoxia secondary to effusions  (b) pain from bone lesions and left humeral and vertebral compression fractures  (c) right upper extremity lymphedema  (d) anemia, requiring transfusion  (e) poor venous access  (4) genetics testing on 12/25/2018 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 STK11, TP53, TSC1, TSC2, and  VHL.  The following genes were evaluated for sequence changes only: SDHA and HOXB13 c.251G>A variant only. The [facility] Melanoma Panel analyzed the following 9 genes: BAP1 BRCA2 CDK4 CDKN2A MITF POT1 PTEN RB1 Tp53.   (5) palliative radiation to the left humerus 07/20/2019 - 07/31/2019 Site Technique Total Dose (Gy) Dose per Fx (Gy) Completed Fx Beam Energies  Humerus, Left: Ext_Lt_humerus Complex 30/30 3 10/10 6X    PLAN: Suzanne [name] continues on treatment with Anastrozole and Palbociclib with good tolerance.  Her ANC is stable.  She will continue with her current dosage as prescribed.    Suzanne [name] has no concerns for disease progression, other than a recurrence of pain in her shoulder.  She has undergone pinning of this shoulder by ortho.  We will get plain films to start the evaluation of this recurrence in pain.    She is receiving Xgeva every 4 weeks, and continues to do well with this.  She has no dental issues.    Suzanne [name] and I reviewed her CT chest in detail.  They are concerned about the aortic atherosclerosis.  I have placed a referral to cardiology to meet with [facility] in the next month or so to discuss these findings, do any work up if necessary.    I placed orders for a restaging CT to occur in December with f/u with Dr. Jana Hakim afterwards.   She knows to call for any questions that may arise between now and her next appointment.  We are happy to see her sooner if needed.   She knows to call for any other issue that may develop before the next visit  Total encounter time 30 minutes.Wilber Bihari, NP 03/23/20 3:01 PM Medical Oncology and Hematology [facility] [place], [place]  [address] Tel. [phone]    Fax. [phone]    *Total Encounter Time as defined by the Centers for Medicare and Medicaid Services includes, in addition to the face-to-face time of a patient visit (documented in the note above) non-face-to-face time: obtaining and  reviewing outside history, ordering and reviewing medications, tests or procedures, care coordination (communications with other health care professionals or caregivers) and documentation in the medical record.

## 2020-03-24 DIAGNOSIS — R0602 Shortness of breath: Secondary | ICD-10-CM | POA: Diagnosis not present

## 2020-03-24 DIAGNOSIS — C7951 Secondary malignant neoplasm of bone: Secondary | ICD-10-CM | POA: Diagnosis not present

## 2020-03-24 DIAGNOSIS — J90 Pleural effusion, not elsewhere classified: Secondary | ICD-10-CM | POA: Diagnosis not present

## 2020-03-24 DIAGNOSIS — G893 Neoplasm related pain (acute) (chronic): Secondary | ICD-10-CM | POA: Diagnosis not present

## 2020-03-24 DIAGNOSIS — C50811 Malignant neoplasm of overlapping sites of right female breast: Secondary | ICD-10-CM | POA: Diagnosis not present

## 2020-03-24 DIAGNOSIS — C7801 Secondary malignant neoplasm of right lung: Secondary | ICD-10-CM | POA: Diagnosis not present

## 2020-03-24 DIAGNOSIS — D649 Anemia, unspecified: Secondary | ICD-10-CM | POA: Diagnosis not present

## 2020-03-24 DIAGNOSIS — R69 Illness, unspecified: Secondary | ICD-10-CM | POA: Diagnosis not present

## 2020-03-24 LAB — CANCER ANTIGEN 27.29: CA 27.29: 26.3 U/mL (ref 0.0–38.6)

## 2020-03-25 ENCOUNTER — Telehealth: Payer: Self-pay

## 2020-03-25 ENCOUNTER — Ambulatory Visit (HOSPITAL_COMMUNITY)
Admission: RE | Admit: 2020-03-25 | Discharge: 2020-03-25 | Disposition: A | Discharge status: Home / Self Care | Payer: Medicaid Other | Source: Ambulatory Visit | Attending: Adult Health | Admitting: Adult Health

## 2020-03-25 ENCOUNTER — Other Ambulatory Visit: Payer: Self-pay

## 2020-03-25 DIAGNOSIS — G893 Neoplasm related pain (acute) (chronic): Secondary | ICD-10-CM | POA: Diagnosis not present

## 2020-03-25 DIAGNOSIS — C50911 Malignant neoplasm of unspecified site of right female breast: Secondary | ICD-10-CM | POA: Diagnosis present

## 2020-03-25 DIAGNOSIS — Z170 Estrogen receptor positive status [ER+]: Secondary | ICD-10-CM | POA: Insufficient documentation

## 2020-03-25 DIAGNOSIS — C50811 Malignant neoplasm of overlapping sites of right female breast: Secondary | ICD-10-CM | POA: Diagnosis not present

## 2020-03-25 DIAGNOSIS — J90 Pleural effusion, not elsewhere classified: Secondary | ICD-10-CM | POA: Diagnosis not present

## 2020-03-25 DIAGNOSIS — S42302D Unspecified fracture of shaft of humerus, left arm, subsequent encounter for fracture with routine healing: Secondary | ICD-10-CM | POA: Diagnosis not present

## 2020-03-25 DIAGNOSIS — C7801 Secondary malignant neoplasm of right lung: Secondary | ICD-10-CM | POA: Diagnosis not present

## 2020-03-25 DIAGNOSIS — S42202D Unspecified fracture of upper end of left humerus, subsequent encounter for fracture with routine healing: Secondary | ICD-10-CM | POA: Diagnosis not present

## 2020-03-25 DIAGNOSIS — R69 Illness, unspecified: Secondary | ICD-10-CM | POA: Diagnosis not present

## 2020-03-25 DIAGNOSIS — C7800 Secondary malignant neoplasm of unspecified lung: Secondary | ICD-10-CM

## 2020-03-25 DIAGNOSIS — Z853 Personal history of malignant neoplasm of breast: Secondary | ICD-10-CM | POA: Diagnosis not present

## 2020-03-25 DIAGNOSIS — C7951 Secondary malignant neoplasm of bone: Secondary | ICD-10-CM | POA: Diagnosis not present

## 2020-03-25 DIAGNOSIS — R0602 Shortness of breath: Secondary | ICD-10-CM | POA: Diagnosis not present

## 2020-03-25 DIAGNOSIS — D649 Anemia, unspecified: Secondary | ICD-10-CM | POA: Diagnosis not present

## 2020-03-25 NOTE — Telephone Encounter (Signed)
-----   Message from Gardenia Phlegm, NP sent at 03/25/2020  1:54 PM EDT ----- Please let patient know that xray doesn't show anything acute, and they should f/u with ortho ----- Message ----- From: Interface, Rad Results In Sent: 03/25/2020  12:23 PM EDT To: Gardenia Phlegm, NP

## 2020-03-25 NOTE — Telephone Encounter (Signed)
Called pt to provide this information. Pt verbalized understanding and agreement & states it is feeling better than it has. Pt understands to call with any concerns.

## 2020-03-28 DIAGNOSIS — J9621 Acute and chronic respiratory failure with hypoxia: Secondary | ICD-10-CM | POA: Diagnosis not present

## 2020-03-28 DIAGNOSIS — R69 Illness, unspecified: Secondary | ICD-10-CM | POA: Diagnosis not present

## 2020-03-28 DIAGNOSIS — S22000A Wedge compression fracture of unspecified thoracic vertebra, initial encounter for closed fracture: Secondary | ICD-10-CM | POA: Diagnosis not present

## 2020-03-28 DIAGNOSIS — C7801 Secondary malignant neoplasm of right lung: Secondary | ICD-10-CM | POA: Diagnosis not present

## 2020-03-28 DIAGNOSIS — C50811 Malignant neoplasm of overlapping sites of right female breast: Secondary | ICD-10-CM | POA: Diagnosis not present

## 2020-03-28 DIAGNOSIS — R0602 Shortness of breath: Secondary | ICD-10-CM | POA: Diagnosis not present

## 2020-03-28 NOTE — Progress Notes (Signed)
X-ray results, and progress notes faxed to [phone] per daughter's request.

## 2020-03-29 DIAGNOSIS — Z961 Presence of intraocular lens: Secondary | ICD-10-CM | POA: Diagnosis not present

## 2020-03-29 DIAGNOSIS — H0102B Squamous blepharitis left eye, upper and lower eyelids: Secondary | ICD-10-CM | POA: Diagnosis not present

## 2020-03-29 DIAGNOSIS — H0102A Squamous blepharitis right eye, upper and lower eyelids: Secondary | ICD-10-CM | POA: Diagnosis not present

## 2020-03-29 DIAGNOSIS — H40013 Open angle with borderline findings, low risk, bilateral: Secondary | ICD-10-CM | POA: Diagnosis not present

## 2020-03-30 ENCOUNTER — Telehealth: Payer: Self-pay | Admitting: *Deleted

## 2020-03-30 DIAGNOSIS — R69 Illness, unspecified: Secondary | ICD-10-CM | POA: Diagnosis not present

## 2020-03-30 DIAGNOSIS — C50811 Malignant neoplasm of overlapping sites of right female breast: Secondary | ICD-10-CM | POA: Diagnosis not present

## 2020-03-30 MED ORDER — LORATADINE 10 MG PO TABS
10.0000 mg | ORAL_TABLET | Freq: Every day | ORAL | 3 refills | Status: DC
Start: 2020-03-30 — End: 2020-07-08

## 2020-03-30 NOTE — Telephone Encounter (Signed)
Received vm call from pt's daughter asking about x-ray films being placed on a disc for ortho appt.  Will call radiology & have films/disc sent to Dr Esmond Plants with [facility].  Her appt is in 2 wks.  She also wanted a refill on her Claritin.  She tried without it but determined that she needs it.  Script sent electronically.

## 2020-03-31 DIAGNOSIS — C50811 Malignant neoplasm of overlapping sites of right female breast: Secondary | ICD-10-CM | POA: Diagnosis not present

## 2020-03-31 DIAGNOSIS — R69 Illness, unspecified: Secondary | ICD-10-CM | POA: Diagnosis not present

## 2020-03-31 MED FILL — IBRANCE 100 MG TABS: 100 | 28 days supply | Qty: 21 | Fill #1

## 2020-04-01 ENCOUNTER — Ambulatory Visit (INDEPENDENT_AMBULATORY_CARE_PROVIDER_SITE_OTHER): Payer: Medicaid Other | Admitting: Cardiovascular Disease

## 2020-04-01 ENCOUNTER — Other Ambulatory Visit: Payer: Self-pay

## 2020-04-01 ENCOUNTER — Encounter: Payer: Self-pay | Admitting: Cardiovascular Disease

## 2020-04-01 VITALS — BP 126/84 | HR 85 | Ht 59.0 in | Wt 200.0 lb

## 2020-04-01 DIAGNOSIS — I10 Essential (primary) hypertension: Secondary | ICD-10-CM | POA: Diagnosis not present

## 2020-04-01 NOTE — Patient Instructions (Signed)
Medication Instructions:  Your physician recommends that you continue on your current medications as directed. Please refer to the Current Medication list given to you today.  *If you need a refill on your cardiac medications before your next appointment, please call your pharmacy*   Lab Work: none If you have labs (blood work) drawn today and your tests are completely normal, you will receive your results only by: Marland Kitchen MyChart Message (if you have MyChart) OR . A paper copy in the mail If you have any lab test that is abnormal or we need to change your treatment, we will call you to review the results.   Testing/Procedures: none   Follow-Up: At [facility], you and your health needs are our priority.  As part of our continuing mission to provide you with exceptional heart care, we have created designated Provider Care Teams.  These Care Teams include your primary Cardiologist (physician) and Advanced Practice Providers (APPs -  Physician Assistants and Nurse Practitioners) who all work together to provide you with the care you need, when you need it.  We recommend signing up for the patient portal called "MyChart".  Sign up information is provided on this After Visit Summary.  MyChart is used to connect with patients for Virtual Visits (Telemedicine).  Patients are able to view lab/test results, encounter notes, upcoming appointments, etc.  Non-urgent messages can be sent to your provider as well.   To learn more about what you can do with MyChart, go to [email or web address].    Your next appointment: as needed   Other Instructions  Mediterranean Diet A Mediterranean diet refers to food and lifestyle choices that are based on the traditions of countries located on the [employer]. This way of eating has been shown to help prevent certain conditions and improve outcomes for people who have chronic diseases, like kidney disease and heart disease. What are tips for following  this plan? Lifestyle  Cook and eat meals together with your family, when possible.  Drink enough fluid to keep your urine clear or pale yellow.  Be physically active every day. This includes: ? Aerobic exercise like running or swimming. ? Leisure activities like gardening, walking, or housework.  Get 7-8 hours of sleep each night.  If recommended by your health care provider, drink red wine in moderation. This means 1 glass a day for nonpregnant women and 2 glasses a day for men. A glass of wine equals 5 oz (150 mL). Reading food labels   Check the serving size of packaged foods. For foods such as rice and pasta, the serving size refers to the amount of cooked product, not dry.  Check the total fat in packaged foods. Avoid foods that have saturated fat or trans fats.  Check the ingredients list for added sugars, such as corn syrup. Shopping  At the grocery store, buy most of your food from the areas near the walls of the store. This includes: ? Fresh fruits and vegetables (produce). ? Grains, beans, nuts, and seeds. Some of these may be available in unpackaged forms or large amounts (in bulk). ? Fresh seafood. ? Poultry and eggs. ? Low-fat dairy products.  Buy whole ingredients instead of prepackaged foods.  Buy fresh fruits and vegetables in-season from local farmers markets.  Buy frozen fruits and vegetables in resealable bags.  If you do not have access to quality fresh seafood, buy precooked frozen shrimp or canned fish, such as tuna, salmon, or sardines.  Buy small amounts of raw  or cooked vegetables, salads, or olives from the deli or salad bar at your store.  Stock your pantry so you always have certain foods on hand, such as olive oil, canned tuna, canned tomatoes, rice, pasta, and beans. Cooking  Cook foods with extra-virgin olive oil instead of using butter or other vegetable oils.  Have meat as a side dish, and have vegetables or grains as your main dish. This  means having meat in small portions or adding small amounts of meat to foods like pasta or stew.  Use beans or vegetables instead of meat in common dishes like chili or lasagna.  Experiment with different cooking methods. Try roasting or broiling vegetables instead of steaming or sauteing them.  Add frozen vegetables to soups, stews, pasta, or rice.  Add nuts or seeds for added healthy fat at each meal. You can add these to yogurt, salads, or vegetable dishes.  Marinate fish or vegetables using olive oil, lemon juice, garlic, and fresh herbs. Meal planning   Plan to eat 1 vegetarian meal one day each week. Try to work up to 2 vegetarian meals, if possible.  Eat seafood 2 or more times a week.  Have healthy snacks readily available, such as: ? Vegetable sticks with hummus. ? [place] yogurt. ? Fruit and nut trail mix.  Eat balanced meals throughout the week. This includes: ? Fruit: 2-3 servings a day ? Vegetables: 4-5 servings a day ? Low-fat dairy: 2 servings a day ? Fish, poultry, or lean meat: 1 serving a day ? Beans and legumes: 2 or more servings a week ? Nuts and seeds: 1-2 servings a day ? Whole grains: 6-8 servings a day ? Extra-virgin olive oil: 3-4 servings a day  Limit red meat and sweets to only a few servings a month What are my food choices?  Mediterranean diet ? Recommended  Grains: Whole-grain pasta. Brown rice. Bulgar wheat. Polenta. Couscous. Whole-wheat bread. [place].  Vegetables: Artichokes. Beets. Broccoli. Cabbage. Carrots. Eggplant. Green beans. Chard. Kale. Spinach. Onions. Leeks. Peas. Squash. Tomatoes. Peppers. Radishes.  Fruits: Apples. Apricots. Avocado. Berries. Bananas. Cherries. Dates. Figs. Grapes. Lemons. Melon. Oranges. Peaches. Plums. Pomegranate.  Meats and other protein foods: Beans. Almonds. Sunflower seeds. Pine nuts. Peanuts. [place]. Salmon. Scallops. Shrimp. [place]. Tilapia. Clams. Oysters. Eggs.  Dairy: Low-fat milk. Cheese.  Greek yogurt.  Beverages: Water. Red wine. Herbal tea.  Fats and oils: Extra virgin olive oil. Avocado oil. Grape seed oil.  Sweets and desserts: [place] yogurt with honey. Baked apples. Poached pears. Trail mix.  Seasoning and other foods: Basil. Cilantro. Coriander. Cumin. Mint. Parsley. Sage. Rosemary. Tarragon. Garlic. Oregano. Thyme. Pepper. Balsalmic vinegar. Tahini. Hummus. Tomato sauce. Olives. Mushrooms. ? Limit these  Grains: Prepackaged pasta or rice dishes. Prepackaged cereal with added sugar.  Vegetables: Deep fried potatoes (french fries).  Fruits: Fruit canned in syrup.  Meats and other protein foods: Beef. Pork. Lamb. Poultry with skin. Hot dogs. [place] Salines.  Dairy: Ice cream. Sour cream. Whole milk.  Beverages: Juice. Sugar-sweetened soft drinks. Beer. Liquor and spirits.  Fats and oils: Butter. Canola oil. Vegetable oil. Beef fat (tallow). Lard.  Sweets and desserts: Cookies. Cakes. Pies. Candy.  Seasoning and other foods: Mayonnaise. Premade sauces and marinades. The items listed may not be a complete list. Talk with your dietitian about what dietary choices are right for you. Summary  The Mediterranean diet includes both food and lifestyle choices.  Eat a variety of fresh fruits and vegetables, beans, nuts, seeds, and whole grains.  Limit  the amount of red meat and sweets that you eat.  Talk with your health care provider about whether it is safe for you to drink red wine in moderation. This means 1 glass a day for nonpregnant women and 2 glasses a day for men. A glass of wine equals 5 oz (150 mL). This information is not intended to replace advice given to you by your health care provider. Make sure you discuss any questions you have with your health care provider. Document Revised: 01/12/2016 Document Reviewed: 01/05/2016 Elsevier Patient Education  [place].

## 2020-04-01 NOTE — Progress Notes (Signed)
Cardiology Office Note:    Date:  04/01/2020   ID:  Suzanne [name], DOB 1960/07/16, MRN [id number]  PCP:  Antony Blackbird, MD  [facility] Cardiologist:  [facility] Electrophysiologist:  None   Referring MD: Wilber Bihari Cornett*   Chief Complaint  Patient presents with  . Congestive Heart Failure     History of Present Illness:    Suzanne [name] is a 60 y.o. female with a hx of metastatic breast cancer . She was found to have calcifications of her aorta and we were asked to see her by Wilber Bihari .   Seen with daughter Suzanne [name].   74 yo. Breast cancer dx June 2020.   Stage IV.  Seems to be doing well at this point Can walk short distances without any difficulty   No recentl episodes of CP .   Thinks she had some CP about 20 years ago ,  Saw a cardiologist in [place].  Had a stress test which looked ok .  Prior to breast cancer dx, she was fairly active   She was hospitalized in aug. 2020 with diastolic CHF,  Was started on Lasix and coreg.   She stopped the coreg.  Has continued her lasix  She was found to have a pathologic fracture in her humerus and this was repaired while she was in the hospital.  Mets to arm, pleura, bone, lymph nodes    Past Medical History:  Diagnosis Date  . Cancer ([place])   . Family history of breast cancer   . Family history of colon cancer   . Family history of melanoma   . Family history of prostate cancer   . Gallstones   . Glaucoma   . Mastitis    reports history of recurrent mastitis  . Metastatic breast cancer ([place])   . Obesity     Past Surgical History:  Procedure Laterality Date  . GLAUCOMA SURGERY  late 1990's  . IR THORACENTESIS ASP PLEURAL SPACE W/IMG GUIDE  10/28/2018  . IR THORACENTESIS ASP PLEURAL SPACE W/IMG GUIDE  10/29/2018  . OPEN REDUCTION INTERNAL FIXATION (ORIF) DISTAL RADIAL FRACTURE Left 01/09/2019   Procedure: OPEN REDUCTION INTERNAL FIXATION (ORIF) HUMERAL FRACTURE;  Surgeon: Netta Cedars, MD;   Location: [facility];  Service: Orthopedics;  Laterality: Left;    Current Medications: Current Meds  Medication Sig  . albuterol (VENTOLIN HFA) 108 (90 Base) MCG/ACT inhaler Inhale 2 puffs into the lungs every 6 (six) hours as needed for wheezing or shortness of breath.  . ALPRAZolam (XANAX) 0.25 MG tablet Take 1-2 tablets (0.25-0.5 mg total) by mouth every 8 (eight) hours as needed for anxiety. Prescription needed for SNF placement  . anastrozole (ARIMIDEX) 1 MG tablet Take 1 tablet (1 mg total) by mouth daily.  Marland Kitchen antiseptic oral rinse (BIOTENE) LIQD 15 mLs by Mouth Rinse route as needed for dry mouth.  . benzonatate (TESSALON) 200 MG capsule TAKE 1 CAPSULE BY MOUTH 3 TIMES DAILY IF NEEDED  . Blood Pressure Monitor MISC Use to check blood pressure twice per day and more often if not feeling well  . calcium-vitamin D (OSCAL WITH D) 500-200 MG-UNIT tablet Take 1 tablet by mouth 3 (three) times daily.  . carvedilol (COREG) 3.125 MG tablet Take 3.125 mg by mouth 2 (two) times daily with a meal. As directed  . fluconazole (DIFLUCAN) 100 MG tablet Take 1 tablet (100 mg total) by mouth daily. Take medication for 10 days and then take only as  needed.  . furosemide (LASIX) 20 MG tablet Take 1 tablet (20 mg total) by mouth daily.  [name] 100 MG tablet TAKE 1 TABLET (100 MG TOTAL) BY MOUTH DAILY. TAKE FOR 21 DAYS ON, 7 DAYS OFF, REPEAT EVERY 28 DAYS.  Marland Kitchen ketoconazole (NIZORAL) 2 % cream Apply to affected areas daily as needed  . loratadine (CLARITIN) 10 MG tablet Take 1 tablet (10 mg total) by mouth daily.  Marland Kitchen nystatin (MYCOSTATIN/NYSTOP) powder Apply 1 application topically as needed.  . polyethylene glycol (MIRALAX / GLYCOLAX) 17 g packet Take 17 g by mouth as needed for constipation.  . sertraline (ZOLOFT) 50 MG tablet Take 75 mg by mouth at bedtime.  Marland Kitchen ZOFRAN 8 MG tablet Take 8 mg by mouth every 8 (eight) hours.  . [DISCONTINUED] sertraline (ZOLOFT) 50 MG tablet Two pills once per day at bedtime  (Patient taking differently: Take 75 mg by mouth at bedtime. )     Allergies:   Patient has no known allergies.   Social History   Socioeconomic History  . Marital status: Widowed    Spouse name: Not on file  . Number of children: 1  . Years of education: Not on file  . Highest education level: Not on file  Occupational History  . Occupation: Doctor, general practice    Comment: Self-employed  Tobacco Use  . Smoking status: Never Smoker  . Smokeless tobacco: Never Used  . Tobacco comment: second hand smoke exposure  Vaping Use  . Vaping Use: Never used  Substance and Sexual Activity  . Alcohol use: No  . Drug use: No  . Sexual activity: Not Currently  Other Topics Concern  . Not on file  Social History Narrative   Has a daughter named Suzanne [name]. Daughter has CP.   Social Determinants of Health   Financial Resource Strain:   . Difficulty of Paying Living Expenses: Not on file  Food Insecurity:   . Worried About Charity fundraiser in the Last Year: Not on file  . Ran Out of Food in the Last Year: Not on file  Transportation Needs:   . Lack of Transportation (Medical): Not on file  . Lack of Transportation (Non-Medical): Not on file  Physical Activity:   . Days of Exercise per Week: Not on file  . Minutes of Exercise per Session: Not on file  Stress:   . Feeling of Stress : Not on file  Social Connections:   . Frequency of Communication with Friends and Family: Not on file  . Frequency of Social Gatherings with Friends and Family: Not on file  . Attends Religious Services: Not on file  . Active Member of Clubs or Organizations: Not on file  . Attends Archivist Meetings: Not on file  . Marital Status: Not on file     Family History: The patient's family history includes Breast cancer in her sister; Colon cancer in her cousin; Heart attack in her father and mother; Heart disease in her brother and brother; Prostate cancer in her brother; Skin cancer in  her brother.  ROS:   Please see the history of present illness.     All other systems reviewed and are negative.  EKGs/Labs/Other Studies Reviewed:    The following studies were reviewed today:     Recent Labs: 03/23/2020: ALT 8; BUN 17; Creatinine 0.85; Hemoglobin 8.6; Platelet Count 269; Potassium 4.1; Sodium 138  Recent Lipid Panel    Component Value Date/Time   CHOL 111 10/27/2018  0859   TRIG 120 01/10/2019 0500   HDL 24 (L) 10/27/2018 0859   CHOLHDL 4.6 10/27/2018 0859   VLDL 23 10/27/2018 0859   LDLCALC 64 10/27/2018 0859     Risk Assessment/Calculations:       Physical Exam:    VS:  BP 126/84   Pulse 85   Ht 4\' 11"  (1.499 m)   Wt 200 lb (90.7 kg)   LMP 05/28/2013 (Within Months)   SpO2 100%   BMI 40.40 kg/m     Wt Readings from Last 3 Encounters:  04/01/20 200 lb (90.7 kg)  03/23/20 201 lb 14.4 oz (91.6 kg)  12/29/19 191 lb 1.6 oz (86.7 kg)     GEN:   Morbidly obese female, no acute distress examined in a wheelchair HEENT: Normal NECK: No JVD; No carotid bruits LYMPHATICS: No lymphadenopathy CARDIAC: RRR, no murmurs, rubs, gallops RESPIRATORY:  Clear to auscultation without rales, wheezing or rhonchi  ABDOMEN: Soft, non-tender, non-distended MUSCULOSKELETAL:  No edema; No deformity  SKIN: Warm and dry NEUROLOGIC:  Alert and oriented x 3 PSYCHIATRIC:  Normal affect   EKG:  Nov. 5, 2021:  NSR at 85.  NS ST / T wave abn.   ASSESSMENT:    1. Essential hypertension    PLAN:    In order of problems listed above:  1. Aortic atherosclerosis: She was incidentally found to have aortic atherosclerosis on a CT scan.  She was also found to have some calcifications in her LAD distribution.  She is not having any angina symptoms.  Given the fact that she has widely metastatic breast cancer, I do not think that there is any indication for any further work-up.  She remains asymptomatic. Her last lipid levels were drawn a year ago and actually look good.   We did discuss the possibility of further lowering of her cholesterol levels but this would likely involve using a statin medication.  I am not sure that I would recommend putting on a statin medication which potentially could cause some liver issues since she is on 3 different chemotherapeutic agents.  2.  Diastolic CHF: She was admitted to the hospital a year ago with diastolic CHF.  She has improved with Lasix and remains on Lasix.  At this point I would not recommend any specific changes.  I recommended that she eat a low salt and low fat diet.  I have given her recommendations on her Mediterranean diet.  I will see her again on an as-needed basis.    Shared Decision Making/Informed Consent      Medication Adjustments/Labs and Tests Ordered: Current medicines are reviewed at length with the patient today.  Concerns regarding medicines are outlined above.  Orders Placed This Encounter  Procedures  . EKG 12-Lead   No orders of the defined types were placed in this encounter.   Patient Instructions  Medication Instructions:  Your physician recommends that you continue on your current medications as directed. Please refer to the Current Medication list given to you today.  *If you need a refill on your cardiac medications before your next appointment, please call your pharmacy*   Lab Work: none If you have labs (blood work) drawn today and your tests are completely normal, you will receive your results only by: Marland Kitchen MyChart Message (if you have MyChart) OR . A paper copy in the mail If you have any lab test that is abnormal or we need to change your treatment, we will call you to review  the results.   Testing/Procedures: none   Follow-Up: At [facility], you and your health needs are our priority.  As part of our continuing mission to provide you with exceptional heart care, we have created designated Provider Care Teams.  These Care Teams include your primary Cardiologist  (physician) and Advanced Practice Providers (APPs -  Physician Assistants and Nurse Practitioners) who all work together to provide you with the care you need, when you need it.  We recommend signing up for the patient portal called "MyChart".  Sign up information is provided on this After Visit Summary.  MyChart is used to connect with patients for Virtual Visits (Telemedicine).  Patients are able to view lab/test results, encounter notes, upcoming appointments, etc.  Non-urgent messages can be sent to your provider as well.   To learn more about what you can do with MyChart, go to [email or web address].    Your next appointment: as needed   Other Instructions  Mediterranean Diet A Mediterranean diet refers to food and lifestyle choices that are based on the traditions of countries located on the [employer]. This way of eating has been shown to help prevent certain conditions and improve outcomes for people who have chronic diseases, like kidney disease and heart disease. What are tips for following this plan? Lifestyle  Cook and eat meals together with your family, when possible.  Drink enough fluid to keep your urine clear or pale yellow.  Be physically active every day. This includes: ? Aerobic exercise like running or swimming. ? Leisure activities like gardening, walking, or housework.  Get 7-8 hours of sleep each night.  If recommended by your health care provider, drink red wine in moderation. This means 1 glass a day for nonpregnant women and 2 glasses a day for men. A glass of wine equals 5 oz (150 mL). Reading food labels   Check the serving size of packaged foods. For foods such as rice and pasta, the serving size refers to the amount of cooked product, not dry.  Check the total fat in packaged foods. Avoid foods that have saturated fat or trans fats.  Check the ingredients list for added sugars, such as corn syrup. Shopping  At the grocery store, buy most of  your food from the areas near the walls of the store. This includes: ? Fresh fruits and vegetables (produce). ? Grains, beans, nuts, and seeds. Some of these may be available in unpackaged forms or large amounts (in bulk). ? Fresh seafood. ? Poultry and eggs. ? Low-fat dairy products.  Buy whole ingredients instead of prepackaged foods.  Buy fresh fruits and vegetables in-season from local farmers markets.  Buy frozen fruits and vegetables in resealable bags.  If you do not have access to quality fresh seafood, buy precooked frozen shrimp or canned fish, such as tuna, salmon, or sardines.  Buy small amounts of raw or cooked vegetables, salads, or olives from the deli or salad bar at your store.  Stock your pantry so you always have certain foods on hand, such as olive oil, canned tuna, canned tomatoes, rice, pasta, and beans. Cooking  Cook foods with extra-virgin olive oil instead of using butter or other vegetable oils.  Have meat as a side dish, and have vegetables or grains as your main dish. This means having meat in small portions or adding small amounts of meat to foods like pasta or stew.  Use beans or vegetables instead of meat in common dishes like chili or lasagna.  Experiment with different cooking methods. Try roasting or broiling vegetables instead of steaming or sauteing them.  Add frozen vegetables to soups, stews, pasta, or rice.  Add nuts or seeds for added healthy fat at each meal. You can add these to yogurt, salads, or vegetable dishes.  Marinate fish or vegetables using olive oil, lemon juice, garlic, and fresh herbs. Meal planning   Plan to eat 1 vegetarian meal one day each week. Try to work up to 2 vegetarian meals, if possible.  Eat seafood 2 or more times a week.  Have healthy snacks readily available, such as: ? Vegetable sticks with hummus. ? [place] yogurt. ? Fruit and nut trail mix.  Eat balanced meals throughout the week. This  includes: ? Fruit: 2-3 servings a day ? Vegetables: 4-5 servings a day ? Low-fat dairy: 2 servings a day ? Fish, poultry, or lean meat: 1 serving a day ? Beans and legumes: 2 or more servings a week ? Nuts and seeds: 1-2 servings a day ? Whole grains: 6-8 servings a day ? Extra-virgin olive oil: 3-4 servings a day  Limit red meat and sweets to only a few servings a month What are my food choices?  Mediterranean diet ? Recommended  Grains: Whole-grain pasta. Brown rice. Bulgar wheat. Polenta. Couscous. Whole-wheat bread. [place].  Vegetables: Artichokes. Beets. Broccoli. Cabbage. Carrots. Eggplant. Green beans. Chard. Kale. Spinach. Onions. Leeks. Peas. Squash. Tomatoes. Peppers. Radishes.  Fruits: Apples. Apricots. Avocado. Berries. Bananas. Cherries. Dates. Figs. Grapes. Lemons. Melon. Oranges. Peaches. Plums. Pomegranate.  Meats and other protein foods: Beans. Almonds. Sunflower seeds. Pine nuts. Peanuts. [place]. Salmon. Scallops. Shrimp. [place]. Tilapia. Clams. Oysters. Eggs.  Dairy: Low-fat milk. Cheese. Greek yogurt.  Beverages: Water. Red wine. Herbal tea.  Fats and oils: Extra virgin olive oil. Avocado oil. Grape seed oil.  Sweets and desserts: [place] yogurt with honey. Baked apples. Poached pears. Trail mix.  Seasoning and other foods: Basil. Cilantro. Coriander. Cumin. Mint. Parsley. Sage. Rosemary. Tarragon. Garlic. Oregano. Thyme. Pepper. Balsalmic vinegar. Tahini. Hummus. Tomato sauce. Olives. Mushrooms. ? Limit these  Grains: Prepackaged pasta or rice dishes. Prepackaged cereal with added sugar.  Vegetables: Deep fried potatoes (french fries).  Fruits: Fruit canned in syrup.  Meats and other protein foods: Beef. Pork. Lamb. Poultry with skin. Hot dogs. Berniece Salines.  Dairy: Ice cream. Sour cream. Whole milk.  Beverages: Juice. Sugar-sweetened soft drinks. Beer. Liquor and spirits.  Fats and oils: Butter. Canola oil. Vegetable oil. Beef fat (tallow).  Lard.  Sweets and desserts: Cookies. Cakes. Pies. Candy.  Seasoning and other foods: Mayonnaise. Premade sauces and marinades. The items listed may not be a complete list. Talk with your dietitian about what dietary choices are right for you. Summary  The Mediterranean diet includes both food and lifestyle choices.  Eat a variety of fresh fruits and vegetables, beans, nuts, seeds, and whole grains.  Limit the amount of red meat and sweets that you eat.  Talk with your health care provider about whether it is safe for you to drink red wine in moderation. This means 1 glass a day for nonpregnant women and 2 glasses a day for men. A glass of wine equals 5 oz (150 mL). This information is not intended to replace advice given to you by your health care provider. Make sure you discuss any questions you have with your health care provider. Document Revised: 01/12/2016 Document Reviewed: 01/05/2016 Elsevier Patient Education  2020 [employer].      Signed, Mertie Moores, MD  04/01/2020 6:06 PM    [place]

## 2020-04-06 ENCOUNTER — Other Ambulatory Visit: Payer: Self-pay

## 2020-04-06 ENCOUNTER — Telehealth: Payer: Self-pay

## 2020-04-06 DIAGNOSIS — Z170 Estrogen receptor positive status [ER+]: Secondary | ICD-10-CM

## 2020-04-06 DIAGNOSIS — C50811 Malignant neoplasm of overlapping sites of right female breast: Secondary | ICD-10-CM

## 2020-04-06 DIAGNOSIS — R69 Illness, unspecified: Secondary | ICD-10-CM | POA: Diagnosis not present

## 2020-04-06 NOTE — Telephone Encounter (Signed)
Pt's daughter, [name] called and states pt is having abdominal discomfort, and pt reports decreased urine output. Pt offered appt with Wilber Bihari, NP. Per Mendel Ryder pt needs to have lab appt with CBC, CMP and UA/UC. Pt is in agreement and understands she should arrive at 1300.

## 2020-04-07 ENCOUNTER — Other Ambulatory Visit: Payer: Self-pay

## 2020-04-07 ENCOUNTER — Inpatient Hospital Stay: Payer: Medicaid Other

## 2020-04-07 ENCOUNTER — Encounter: Payer: Self-pay | Admitting: Adult Health

## 2020-04-07 ENCOUNTER — Telehealth: Payer: Self-pay | Admitting: Adult Health

## 2020-04-07 ENCOUNTER — Inpatient Hospital Stay: Payer: Medicaid Other | Attending: Adult Health | Admitting: Adult Health

## 2020-04-07 VITALS — BP 123/66 | HR 88 | Temp 98.3°F | Resp 18 | Ht 59.0 in | Wt 200.0 lb

## 2020-04-07 DIAGNOSIS — E669 Obesity, unspecified: Secondary | ICD-10-CM | POA: Diagnosis not present

## 2020-04-07 DIAGNOSIS — D649 Anemia, unspecified: Secondary | ICD-10-CM | POA: Insufficient documentation

## 2020-04-07 DIAGNOSIS — C50911 Malignant neoplasm of unspecified site of right female breast: Secondary | ICD-10-CM

## 2020-04-07 DIAGNOSIS — C779 Secondary and unspecified malignant neoplasm of lymph node, unspecified: Secondary | ICD-10-CM | POA: Diagnosis not present

## 2020-04-07 DIAGNOSIS — R0902 Hypoxemia: Secondary | ICD-10-CM | POA: Insufficient documentation

## 2020-04-07 DIAGNOSIS — C7951 Secondary malignant neoplasm of bone: Secondary | ICD-10-CM | POA: Insufficient documentation

## 2020-04-07 DIAGNOSIS — C7800 Secondary malignant neoplasm of unspecified lung: Secondary | ICD-10-CM | POA: Diagnosis not present

## 2020-04-07 DIAGNOSIS — R42 Dizziness and giddiness: Secondary | ICD-10-CM | POA: Diagnosis not present

## 2020-04-07 DIAGNOSIS — Z170 Estrogen receptor positive status [ER+]: Secondary | ICD-10-CM | POA: Diagnosis not present

## 2020-04-07 DIAGNOSIS — R1030 Lower abdominal pain, unspecified: Secondary | ICD-10-CM | POA: Diagnosis not present

## 2020-04-07 DIAGNOSIS — J90 Pleural effusion, not elsewhere classified: Secondary | ICD-10-CM | POA: Diagnosis not present

## 2020-04-07 DIAGNOSIS — Z79811 Long term (current) use of aromatase inhibitors: Secondary | ICD-10-CM | POA: Insufficient documentation

## 2020-04-07 DIAGNOSIS — C50811 Malignant neoplasm of overlapping sites of right female breast: Secondary | ICD-10-CM

## 2020-04-07 DIAGNOSIS — R600 Localized edema: Secondary | ICD-10-CM | POA: Insufficient documentation

## 2020-04-07 LAB — URINALYSIS, COMPLETE (UACMP) WITH MICROSCOPIC
Bilirubin Urine: NEGATIVE
Glucose, UA: NEGATIVE mg/dL
Hgb urine dipstick: NEGATIVE
Ketones, ur: NEGATIVE mg/dL
Nitrite: NEGATIVE
Protein, ur: NEGATIVE mg/dL
Specific Gravity, Urine: 1.026 (ref 1.005–1.030)
pH: 5 (ref 5.0–8.0)

## 2020-04-07 LAB — PREPARE RBC (CROSSMATCH)

## 2020-04-07 LAB — CBC WITH DIFFERENTIAL (CANCER CENTER ONLY)
Abs Immature Granulocytes: 0.02 10*3/uL (ref 0.00–0.07)
Basophils Absolute: 0.1 10*3/uL (ref 0.0–0.1)
Basophils Relative: 3 %
Eosinophils Absolute: 0 10*3/uL (ref 0.0–0.5)
Eosinophils Relative: 1 %
HCT: 24.2 % — ABNORMAL LOW (ref 36.0–46.0)
Hemoglobin: 7.5 g/dL — ABNORMAL LOW (ref 12.0–15.0)
Immature Granulocytes: 1 %
Lymphocytes Relative: 29 %
Lymphs Abs: 0.8 10*3/uL (ref 0.7–4.0)
MCH: 35 pg — ABNORMAL HIGH (ref 26.0–34.0)
MCHC: 31 g/dL (ref 30.0–36.0)
MCV: 113.1 fL — ABNORMAL HIGH (ref 80.0–100.0)
Monocytes Absolute: 0.5 10*3/uL (ref 0.1–1.0)
Monocytes Relative: 17 %
Neutro Abs: 1.4 10*3/uL — ABNORMAL LOW (ref 1.7–7.7)
Neutrophils Relative %: 49 %
Platelet Count: 243 10*3/uL (ref 150–400)
RBC: 2.14 MIL/uL — ABNORMAL LOW (ref 3.87–5.11)
RDW: 16.9 % — ABNORMAL HIGH (ref 11.5–15.5)
WBC Count: 2.9 10*3/uL — ABNORMAL LOW (ref 4.0–10.5)
nRBC: 1 % — ABNORMAL HIGH (ref 0.0–0.2)

## 2020-04-07 LAB — CMP (CANCER CENTER ONLY)
ALT: 6 U/L (ref 0–44)
AST: 16 U/L (ref 15–41)
Albumin: 3.4 g/dL — ABNORMAL LOW (ref 3.5–5.0)
Alkaline Phosphatase: 106 U/L (ref 38–126)
Anion gap: 6 (ref 5–15)
BUN: 20 mg/dL (ref 8–23)
CO2: 31 mmol/L (ref 22–32)
Calcium: 8.9 mg/dL (ref 8.9–10.3)
Chloride: 101 mmol/L (ref 98–111)
Creatinine: 0.93 mg/dL (ref 0.44–1.00)
GFR, Estimated: >60 mL/min (ref >60)
Glucose, Bld: 108 mg/dL — ABNORMAL HIGH (ref 70–99)
Potassium: 3.9 mmol/L (ref 3.5–5.1)
Sodium: 138 mmol/L (ref 135–145)
Total Bilirubin: 0.5 mg/dL (ref 0.3–1.2)
Total Protein: 6.9 g/dL (ref 6.5–8.1)

## 2020-04-07 NOTE — Telephone Encounter (Signed)
Scheduled appointment per 11/11 los. Patient is aware of appointment date and time.

## 2020-04-08 ENCOUNTER — Inpatient Hospital Stay: Payer: Medicaid Other

## 2020-04-08 ENCOUNTER — Ambulatory Visit (HOSPITAL_COMMUNITY)
Admission: RE | Admit: 2020-04-08 | Discharge: 2020-04-08 | Disposition: A | Discharge status: Home / Self Care | Payer: Medicaid Other | Source: Ambulatory Visit | Attending: Adult Health | Admitting: Adult Health

## 2020-04-08 DIAGNOSIS — C7951 Secondary malignant neoplasm of bone: Secondary | ICD-10-CM | POA: Insufficient documentation

## 2020-04-08 DIAGNOSIS — Z170 Estrogen receptor positive status [ER+]: Secondary | ICD-10-CM | POA: Insufficient documentation

## 2020-04-08 DIAGNOSIS — K449 Diaphragmatic hernia without obstruction or gangrene: Secondary | ICD-10-CM | POA: Diagnosis not present

## 2020-04-08 DIAGNOSIS — C7800 Secondary malignant neoplasm of unspecified lung: Secondary | ICD-10-CM | POA: Diagnosis present

## 2020-04-08 DIAGNOSIS — D649 Anemia, unspecified: Secondary | ICD-10-CM

## 2020-04-08 DIAGNOSIS — C50911 Malignant neoplasm of unspecified site of right female breast: Secondary | ICD-10-CM | POA: Diagnosis not present

## 2020-04-08 DIAGNOSIS — J9621 Acute and chronic respiratory failure with hypoxia: Secondary | ICD-10-CM | POA: Diagnosis not present

## 2020-04-08 DIAGNOSIS — R1013 Epigastric pain: Secondary | ICD-10-CM | POA: Diagnosis not present

## 2020-04-08 LAB — URINE CULTURE

## 2020-04-08 MED ORDER — HEPARIN SOD (PORK) LOCK FLUSH 100 UNIT/ML IV SOLN
250.0000 [IU] | INTRAVENOUS | Status: DC | PRN
  Filled 2020-04-08: qty 5

## 2020-04-08 MED ORDER — ACETAMINOPHEN 325 MG PO TABS
ORAL_TABLET | ORAL | Status: AC
  Filled 2020-04-08: qty 2

## 2020-04-08 MED ORDER — DIPHENHYDRAMINE HCL 25 MG PO CAPS
ORAL_CAPSULE | ORAL | Status: AC
  Filled 2020-04-08: qty 1

## 2020-04-08 MED ORDER — SODIUM CHLORIDE 0.9% FLUSH
10.0000 mL | INTRAVENOUS | Status: DC | PRN
  Filled 2020-04-08: qty 10

## 2020-04-08 MED ORDER — DIPHENHYDRAMINE HCL 25 MG PO CAPS
25.0000 mg | ORAL_CAPSULE | Freq: Once | ORAL | Status: AC
  Administered 2020-04-08: 25 mg via ORAL

## 2020-04-08 MED ORDER — SODIUM CHLORIDE 0.9% IV SOLUTION
250.0000 mL | Freq: Once | INTRAVENOUS | Status: AC
  Administered 2020-04-08: 250 mL via INTRAVENOUS
  Filled 2020-04-08: qty 250

## 2020-04-08 MED ORDER — HEPARIN SOD (PORK) LOCK FLUSH 100 UNIT/ML IV SOLN
500.0000 [IU] | Freq: Every day | INTRAVENOUS | Status: DC | PRN
  Filled 2020-04-08: qty 5

## 2020-04-08 MED ORDER — ACETAMINOPHEN 325 MG PO TABS
650.0000 mg | ORAL_TABLET | Freq: Once | ORAL | Status: AC
  Administered 2020-04-08: 650 mg via ORAL

## 2020-04-08 MED ORDER — SODIUM CHLORIDE 0.9% FLUSH
3.0000 mL | INTRAVENOUS | Status: DC | PRN
  Filled 2020-04-08: qty 10

## 2020-04-08 NOTE — Patient Instructions (Signed)

## 2020-04-09 ENCOUNTER — Other Ambulatory Visit: Payer: Self-pay | Admitting: Adult Health

## 2020-04-09 LAB — TYPE AND SCREEN
ABO/RH(D): O POS
Antibody Screen: NEGATIVE
Unit division: 0

## 2020-04-09 LAB — BPAM RBC
Blood Product Expiration Date: 202111302359
ISSUE DATE / TIME: 202111121509
Unit Type and Rh: 9500

## 2020-04-09 NOTE — Progress Notes (Signed)
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  Aaren continues on anastrozole.  She tolerates it well.    She is also on palbociclib stably at 100 mg daily 21 days on 7 days off. She is in her off week.    She receives [place] every 4 weeks. She has no issues with receiving this, and denies any dental concerns.       REVIEW OF SYSTEMS: Suzanne [name] is here for evaluation of lower abdominal pain and dizziness.  Her daughter is concerned because she feels her mom is beginning to have a constellation of symptoms and feeling poorly like she did when she first became sick with breast cancer.  Suzanne [name] had a urinalysis that was collected and negative for infection.  She is having normal bowel movements.  She reports her pain as more of a generalized intermittent cramping.  She notes she feels weaker, and tired.  She sees her orthopedic surgeon next week for her increasing left shoulder pain.    Amen has had no fever or chills.  She has had no cough, shortness of breath, headaches, or vision changes.  A detailed ROS was otherwise non contributory.     HISTORY OF CURRENT ILLNESS: From the original inpatient consult note:  Suzanne [name] is a 59 year old female from [place], [place] without significant past medical history with exception of obesity, remote history of gallstones, and remote history of mastitis.  The patient presented to the hospital  on 10/26/2018 with a 3-week history of worsening shortness of breath and lower extremity edema.  The patient reported that her shortness of breath worsened with exertion.  Her lower extremity edema did not improve with elevation.  She also had an intermittent pr oductive cough with rare sputum production.  The patient was noted to be anemic with a hemoglobin of 4.5 with a clumped platelets noted on admission.  Stool for occult blood was positive.  Patient received 2 units of packed red blood cells.    The patient reported a rash under her breast x1 week which has been foul-smelling.  She also reported that she has some discomfort and thickening/hardness of her right breast for several months.  She states that her right breast is much larger than her left.  She notes some occasional bloody discharge from her breast once in a while.  A chest x-ray on admission showed diffuse bilateral interstitial opacity of unknown chronicity, findings could be related to interstitial inflammatory process, atypical/viral pneumonia, or possible metastatic disease.  There are also sclerotic and lytic lesions within the clavicles, bilateral ribs, shoulders which was concerning for diffuse few skeletal metastatic disease.    This prompted a CT scan of the chest with contrast which showed bilateral axillary and mediastinal adenopathy, borderline hilar lymph nodes bilaterally, findings likely flecked metastatic lymphadenopathy, moderate bilateral pleural effusions, extensive interstitial thickening and reticulonodular opacities throughout the lungs, cannot exclude lymphangitic spread of tumor, diffuse skeletal metastases.  The patient's subsequent history is as detailed below.   PAST MEDICAL HISTORY: Past  Medical History:  Diagnosis Date  . Cancer ([place])   . Family history of breast cancer   . Family history of colon cancer   . Family history of melanoma   . Family history of prostate cancer   . Gallstones   . Glaucoma   .  Mastitis    reports history of recurrent mastitis  . Metastatic breast cancer ([place])   . Obesity     PAST SURGICAL HISTORY: Past Surgical History:  Procedure Laterality Date  . GLAUCOMA SURGERY  late 1990's  . IR THORACENTESIS ASP PLEURAL SPACE W/IMG GUIDE  10/28/2018  . IR THORACENTESIS ASP PLEURAL SPACE W/IMG GUIDE  10/29/2018  . OPEN REDUCTION INTERNAL FIXATION (ORIF) DISTAL RADIAL FRACTURE Left 01/09/2019   Procedure: OPEN REDUCTION INTERNAL FIXATION (ORIF) HUMERAL FRACTURE;  Surgeon: Netta Cedars, MD;  Location: [facility];  Service: Orthopedics;  Laterality: Left;    FAMILY HISTORY: Family History  Problem Relation Age of Onset  . Breast cancer Sister        in her 49's-70s  . Heart disease Brother        CABG  . Heart disease Brother        CABG  . Skin cancer Brother        melanoma  . Colon cancer Cousin   . Heart attack Mother   . Heart attack Father   . Prostate cancer Brother   Patient's father passed away at age 67, and her mother at 11, both from heart attacks. The patient has 8 siblings, 6 brothers and 1 sister.  Her sister was diagnosed with breast cancer. One brother has prostate cancer, another brother had a melanoma removed.    GYNECOLOGIC HISTORY:  Menarche: 59 years old Age at first live birth: 59 years old [place] P 1 LMP age 22 Contraceptive: used for 2-3 years with no problems HRT never used  Hysterectomy? no   SOCIAL HISTORY:  [name] worked for an Engineer, manufacturing systems. She is widowed. She lives at home with her daughter.  Suzanne [name], 37, who is a Psychologist, occupational at [employer] center.  The patient has no grandchildren. She is not a Ambulance person.   ADVANCED DIRECTIVES: not on file; she has the paperwork already. She intends to name her daughter as her HCPOA.   HEALTH MAINTENANCE: Social History   Tobacco Use  . Smoking status: Never Smoker  . Smokeless tobacco: Never Used  . Tobacco comment: second hand smoke exposure  Vaping Use  . Vaping Use:  Never used  Substance Use Topics  . Alcohol use: No  . Drug use: No    Colonoscopy:   PAP:   Bone density:  Mammography:   No Known Allergies  Current Outpatient Medications  Medication Sig Dispense Refill  . albuterol (VENTOLIN HFA) 108 (90 Base) MCG/ACT inhaler Inhale 2 puffs into the lungs every 6 (six) hours as needed for wheezing or shortness of breath. 16 g 3  . ALPRAZolam (XANAX) 0.25 MG tablet Take 1-2 tablets (0.25-0.5 mg total) by mouth every 8 (eight) hours as needed for anxiety. Prescription needed for SNF placement 5 tablet 0  . anastrozole (ARIMIDEX) 1 MG tablet Take 1 tablet (1 mg total) by mouth daily. 30 tablet 3  . antiseptic oral rinse (BIOTENE) LIQD 15 mLs by Mouth Rinse route as needed for dry mouth.    . benzonatate (TESSALON) 200 MG capsule TAKE 1 CAPSULE BY MOUTH 3 TIMES DAILY IF NEEDED 30 capsule 0  . Blood Pressure Monitor MISC  Use to check blood pressure twice per day and more often if not feeling well 1 each 0  . calcium-vitamin D (OSCAL WITH D) 500-200 MG-UNIT tablet Take 1 tablet by mouth 3 (three) times daily. 90 tablet 1  . carvedilol (COREG) 3.125 MG tablet Take 3.125 mg by mouth 2 (two) times daily with a meal. As directed    . fluconazole (DIFLUCAN) 100 MG tablet Take 1 tablet (100 mg total) by mouth daily. Take medication for 10 days and then take only as needed. 90 tablet 3  . furosemide (LASIX) 20 MG tablet Take 1 tablet (20 mg total) by mouth daily. 90 tablet 4  . IBRANCE 100 MG tablet TAKE 1 TABLET (100 MG TOTAL) BY MOUTH DAILY. TAKE FOR 21 DAYS ON, 7 DAYS OFF, REPEAT EVERY 28 DAYS. 21 tablet 1  . ketoconazole (NIZORAL) 2 % cream Apply to affected areas daily as needed 30 g 6  . loratadine (CLARITIN) 10 MG tablet Take 1 tablet (10 mg total) by mouth daily. 30 tablet 3  . nystatin (MYCOSTATIN/NYSTOP) powder Apply 1 application topically as needed.    . polyethylene glycol (MIRALAX / GLYCOLAX) 17 g packet Take 17 g by mouth as needed for constipation.     . sertraline (ZOLOFT) 50 MG tablet Take 75 mg by mouth at bedtime.    Marland Kitchen ZOFRAN 8 MG tablet Take 8 mg by mouth every 8 (eight) hours.     No current facility-administered medications for this visit.    OBJECTIVE:   Vitals:   04/07/20 1331  BP: 123/66  Pulse: 88  Resp: 18  Temp: 98.3 F (36.8 C)  SpO2: 100%     Body mass index is 40.4 kg/m.   Wt Readings from Last 3 Encounters:  04/07/20 200 lb (90.7 kg)  04/01/20 200 lb (90.7 kg)  03/23/20 201 lb 14.4 oz (91.6 kg)  ECOG FS:3 GENERAL: Patient is a well appearing female in no acute distress.  Examined in wheelchair HEENT:  Sclerae anicteric.  Mask in place Neck is supple.  NODES:  No cervical, supraclavicular, or axillary lymphadenopathy palpated.  BREAST EXAM:  Deferred. LUNGS:  Clear to auscultation bilaterally.  No wheezes or rhonchi. HEART:  Regular rate and rhythm. No murmur appreciated. ABDOMEN:  Soft, nontender.  Positive, normoactive bowel sounds. No organomegaly palpated. MSK:  No focal spinal tenderness to palpation.EXTREMITIES:  No peripheral edema.   SKIN:  Clear with no obvious rashes or skin changes. No nail dyscrasia. NEURO:  Nonfocal. Well oriented.  Appropriate affect.   LAB RESULTS:  CMP     Component Value Date/Time   NA 138 04/07/2020 1216   K 3.9 04/07/2020 1216   CL 101 04/07/2020 1216   CO2 31 04/07/2020 1216   GLUCOSE 108 (H) 04/07/2020 1216   BUN 20 04/07/2020 1216   CREATININE 0.93 04/07/2020 1216   CALCIUM 8.9 04/07/2020 1216   PROT 6.9 04/07/2020 1216   ALBUMIN 3.4 (L) 04/07/2020 1216   AST 16 04/07/2020 1216   ALT 6 04/07/2020 1216   ALKPHOS 106 04/07/2020 1216   BILITOT 0.5 04/07/2020 1216   GFRNONAA >60 04/07/2020 1216   GFRAA >60 02/24/2020 1454   GFRAA >60 07/24/2019 1453    No results found for: TOTALPROTELP, ALBUMINELP, A1GS, A2GS, BETS, BETA2SER, GAMS, MSPIKE, SPEI  No results found for: KPAFRELGTCHN, LAMBDASER, KAPLAMBRATIO  Lab Results  Component Value Date   WBC  2.9 (L) 04/07/2020   NEUTROABS 1.4 (L) 04/07/2020   HGB 7.5 (L) 04/07/2020  HCT 24.2 (L) 04/07/2020   MCV 113.1 (H) 04/07/2020   PLT 243 04/07/2020   No results found for: LABCA2  No components found for: EXHBZJ696  No results for input(s): INR in the last 168 hours.  No results found for: LABCA2  No results found for: CAN199  No results found for: CAN125  No results found for: VEL381  Lab Results  Component Value Date   CA2729 26.3 03/23/2020    No components found for: HGQUANT  No results found for: CEA1 / No results found for: CEA1   No results found for: AFPTUMOR  No results found for: [place]  No results found for: HGBA, HGBA2QUANT, HGBFQUANT, HGBSQUAN (Hemoglobinopathy evaluation)   Lab Results  Component Value Date   LDH 244 (H) 12/25/2018    Lab Results  Component Value Date   IRON 137 10/26/2018   TIBC 384 10/26/2018   IRONPCTSAT 36 (H) 10/26/2018   (Iron and TIBC)  Lab Results  Component Value Date   FERRITIN 2,306 (H) 12/25/2018    Urinalysis    Component Value Date/Time   COLORURINE YELLOW 04/07/2020 [place] 04/07/2020 1216   LABSPEC 1.026 04/07/2020 1216   PHURINE 5.0 04/07/2020 1216   GLUCOSEU NEGATIVE 04/07/2020 1216   HGBUR NEGATIVE 04/07/2020 1216   BILIRUBINUR NEGATIVE 04/07/2020 [place] 04/07/2020 [place] 04/07/2020 1216   NITRITE NEGATIVE 04/07/2020 1216   LEUKOCYTESUR SMALL (A) 04/07/2020 1216    STUDIES:  DG Shoulder Left  Result Date: 03/25/2020 CLINICAL DATA:  Arm, metastatic breast cancer, LEFT arm fracture in August 2020 EXAM: LEFT SHOULDER - 2+ VIEW COMPARISON:  01/05/2019 FINDINGS: Plate and multiple screws present at proximal LEFT humerus post ORIF. Underlying permeative mixed lytic and sclerotic process of the proximal LEFT humeral diaphysis into metaphysis consistent with known osseous metastatic disease. Callus identified at a healing fracture of the  proximal humerus. No acute fracture or dislocation identified. Metastases are also seen within ribs, scapula, and distal clavicle. IMPRESSION: Healing fracture proximal LEFT humerus post ORIF. Extensive metastatic disease of LEFT humerus and adjacent visualized LEFT shoulder/chest. No definite acute fracture or dislocation identified. Electronically Signed   By: Lavonia Dana M.D.   On: 03/25/2020 12:18   DG Abd 2 Views  Result Date: 04/08/2020 CLINICAL DATA:  Abdomen discomfort epigastric pain EXAM: ABDOMEN - 2 VIEW COMPARISON:  01/10/2019 FINDINGS: Hiatal hernia. Airspace disease at both bases. Nonobstructed gas pattern with paucity of bowel gas. Diffuse sclerosis within the ribs spine and pelvis consistent with osseous metastatic disease. IMPRESSION: 1. Nonobstructed gas pattern with paucity of bowel gas. 2. Diffuse osseous metastatic disease. 3. Hiatal hernia. Electronically Signed   By: Donavan Foil M.D.   On: 04/08/2020 21:54   DG Humerus Left  Result Date: 03/25/2020 CLINICAL DATA:  LEFT arm pain, history of metastatic breast cancer, LEFT humeral fracture and ORIF EXAM: LEFT HUMERUS - 2+ VIEW COMPARISON:  A 03/17/2019 FINDINGS: Plate and screws at proximal humerus post ORIF. Healing fracture of proximal LEFT humeral metadiaphysis seen. Extensive bone destruction in the LEFT humerus consistent with known osseous metastatic disease. No new fracture, dislocation, or hardware failure identified. IMPRESSION: Extensive metastatic disease of the LEFT humerus. Healing proximal LEFT humeral fracture post ORIF. No new abnormalities. Electronically Signed   By: Lavonia Dana M.D.   On: 03/25/2020 12:21     ELIGIBLE FOR AVAILABLE RESEARCH PROTOCOL: no   ASSESSMENT: 59 y.o. [place], [place] woman presenting 10/26/2018 with  right-sided inflammatory breast cancer, stage IV, involvnh lungs, lymph nodes and bones, as follows:  (a) chest CT scan 10/26/2018 shows bilateral pleural effusions, possible  lymphangitic spread of tumor, diffuse bony metastatic disease, and significant axillary mediastinal and hilar adenopathy  (b) bone scan 10/27/2018 is a "near SuperScan" consistent with widespread bony metastatic disease  (c) head CT with and without contrast 10/30/2018 shows no intracranial metastatic disease, multiple calvarial lesions  (d) CA-27-29 on 10/27/2018 was 1810.4  (1) pleural fluid from right thoracentesis 10/28/2018 confirms malignant cells consistent with a breast primary, strongly estrogen and progesterone receptor positive, HER-2 not amplified, with an MIB-1 of 2%  (2) anastrozole started 10/29/2018  (a) palbociclib to start 11/13/2018 at 100 mg dose  (b) denosumab/xgeva started 11/13/2018   (3) associated problems:  (a) hypoxia secondary to effusions  (b) pain from bone lesions and left humeral and vertebral compression fractures  (c) right upper extremity lymphedema  (d) anemia, requiring transfusion  (e) poor venous access  (4) genetics testing on 12/25/2018 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 STK11, TP53, TSC1, TSC2, and VHL.  The following genes were evaluated for sequence changes only: SDHA and HOXB13 c.251G>A variant only. The [facility] Melanoma Panel analyzed the following 9 genes: BAP1 BRCA2 CDK4 CDKN2A MITF POT1 PTEN RB1 Tp53.   (5) palliative radiation to the left humerus 07/20/2019 - 07/31/2019 Site Technique Total Dose (Gy) Dose per Fx (Gy) Completed Fx  Beam Energies  Humerus, Left: Ext_Lt_humerus Complex 30/30 3 10/10 6X    PLAN: Emie continues on treatment with Anastrozole and Palbociclib with good tolerance.  Her ANC is stable.   Her urine is negative for infection.   At this point we likely will need to restage, however that will take a bit of time to set up.  In the meantime, we will get abdominal plain films on her to r/o anything that could be related to her bowels.    Mittie is anemic and her hemoglobin is less than 8.  She would benefit from a blood transfusion.  Considering her cardiac history, she will only receive one unit.  She typically tolerates the blood well and is willing to receive this.    Analayah will return in a couple of weeks for labs and her injection.  I will call her with her xray results and we will discuss restaging a month or so early at that time.    She knows to call for any questions that may arise between now and her next appointment.  We are happy to see her sooner if needed.  Total encounter time 30 minutes.Wilber Bihari, NP 04/09/20 1:19 PM Medical Oncology and Hematology [facility] [address] [place], [place] [address] Tel. [phone]    Fax. [phone]    *Total Encounter Time as defined by the Centers for Medicare and Medicaid Services includes, in addition to the face-to-face time of a patient visit (documented in the note above) non-face-to-face time: obtaining and reviewing outside history, ordering and reviewing medications, tests or procedures, care coordination (communications with other  health care professionals or caregivers) and documentation in the medical record.

## 2020-04-11 ENCOUNTER — Telehealth: Payer: Self-pay

## 2020-04-11 ENCOUNTER — Other Ambulatory Visit: Payer: Self-pay | Admitting: Adult Health

## 2020-04-11 DIAGNOSIS — R69 Illness, unspecified: Secondary | ICD-10-CM | POA: Diagnosis not present

## 2020-04-11 DIAGNOSIS — C50811 Malignant neoplasm of overlapping sites of right female breast: Secondary | ICD-10-CM | POA: Diagnosis not present

## 2020-04-11 NOTE — Telephone Encounter (Signed)
-----   Message from Gardenia Phlegm, NP sent at 04/11/2020 11:28 AM EST ----- Xray shows no cause of her pain.  Please call her and see how she is doing.  If still present I will go ahead and order CT scans. ----- Message ----- From: Lennie Odor, LPN Sent: 18/56/3149   8:57 AM EST To: Gardenia Phlegm, NP  Please advise. Thanks

## 2020-04-11 NOTE — Telephone Encounter (Signed)
This LPN spoke with pt's daughter who states pt's abd  sx have not subsided. This LPN informed [name] that at this point we will be ordering a CT chest/abd/pelvis to further assess. Message sent to PA team regarding this.

## 2020-04-11 NOTE — Telephone Encounter (Signed)
-----   Message from Gardenia Phlegm, NP sent at 04/11/2020 11:28 AM EST ----- Xray shows no cause of her pain.  Please call her and see how she is doing.  If still present I will go ahead and order CT scans. ----- Message ----- From: Lennie Odor, LPN Sent: 14/27/6701   8:57 AM EST To: Gardenia Phlegm, NP  Please advise. Thanks

## 2020-04-12 ENCOUNTER — Other Ambulatory Visit: Payer: Self-pay | Admitting: Adult Health

## 2020-04-12 DIAGNOSIS — C50811 Malignant neoplasm of overlapping sites of right female breast: Secondary | ICD-10-CM | POA: Diagnosis not present

## 2020-04-12 DIAGNOSIS — C7951 Secondary malignant neoplasm of bone: Secondary | ICD-10-CM

## 2020-04-12 DIAGNOSIS — C7800 Secondary malignant neoplasm of unspecified lung: Secondary | ICD-10-CM

## 2020-04-12 DIAGNOSIS — Z170 Estrogen receptor positive status [ER+]: Secondary | ICD-10-CM

## 2020-04-12 DIAGNOSIS — R69 Illness, unspecified: Secondary | ICD-10-CM | POA: Diagnosis not present

## 2020-04-12 NOTE — Progress Notes (Signed)
Patient with persistent abdominal pain, that is generalized.  Considering her h/o metastatic breast cancer and labs, plain films that are unrevealing, will order CT chest abdomen and pelvis for restaging of her metastatic breast cancer.  Wilber Bihari, NP

## 2020-04-13 DIAGNOSIS — M79602 Pain in left arm: Secondary | ICD-10-CM | POA: Diagnosis not present

## 2020-04-13 DIAGNOSIS — M25512 Pain in left shoulder: Secondary | ICD-10-CM | POA: Diagnosis not present

## 2020-04-15 ENCOUNTER — Other Ambulatory Visit: Payer: Self-pay | Admitting: Oncology

## 2020-04-15 DIAGNOSIS — C50811 Malignant neoplasm of overlapping sites of right female breast: Secondary | ICD-10-CM

## 2020-04-15 DIAGNOSIS — Z170 Estrogen receptor positive status [ER+]: Secondary | ICD-10-CM

## 2020-04-19 ENCOUNTER — Inpatient Hospital Stay: Payer: Medicaid Other

## 2020-04-19 ENCOUNTER — Other Ambulatory Visit: Payer: Self-pay

## 2020-04-19 VITALS — BP 132/74 | HR 80 | Resp 20

## 2020-04-19 DIAGNOSIS — C7801 Secondary malignant neoplasm of right lung: Secondary | ICD-10-CM

## 2020-04-19 DIAGNOSIS — Z6841 Body Mass Index (BMI) 40.0 and over, adult: Secondary | ICD-10-CM

## 2020-04-19 DIAGNOSIS — R0602 Shortness of breath: Secondary | ICD-10-CM

## 2020-04-19 DIAGNOSIS — Z170 Estrogen receptor positive status [ER+]: Secondary | ICD-10-CM

## 2020-04-19 DIAGNOSIS — C7951 Secondary malignant neoplasm of bone: Secondary | ICD-10-CM

## 2020-04-19 DIAGNOSIS — C50811 Malignant neoplasm of overlapping sites of right female breast: Secondary | ICD-10-CM

## 2020-04-19 DIAGNOSIS — J90 Pleural effusion, not elsewhere classified: Secondary | ICD-10-CM

## 2020-04-19 DIAGNOSIS — D649 Anemia, unspecified: Secondary | ICD-10-CM

## 2020-04-19 LAB — COMPREHENSIVE METABOLIC PANEL
ALT: 7 U/L (ref 0–44)
AST: 16 U/L (ref 15–41)
Albumin: 3.3 g/dL — ABNORMAL LOW (ref 3.5–5.0)
Alkaline Phosphatase: 107 U/L (ref 38–126)
Anion gap: 9 (ref 5–15)
BUN: 17 mg/dL (ref 8–23)
CO2: 29 mmol/L (ref 22–32)
Calcium: 9.2 mg/dL (ref 8.9–10.3)
Chloride: 102 mmol/L (ref 98–111)
Creatinine, Ser: 1.04 mg/dL — ABNORMAL HIGH (ref 0.44–1.00)
GFR, Estimated: >60 mL/min (ref >60)
Glucose, Bld: 98 mg/dL (ref 70–99)
Potassium: 3.9 mmol/L (ref 3.5–5.1)
Sodium: 140 mmol/L (ref 135–145)
Total Bilirubin: 0.3 mg/dL (ref 0.3–1.2)
Total Protein: 6.8 g/dL (ref 6.5–8.1)

## 2020-04-19 LAB — CBC WITH DIFFERENTIAL/PLATELET
Abs Immature Granulocytes: 0 10*3/uL (ref 0.00–0.07)
Basophils Absolute: 0.1 10*3/uL (ref 0.0–0.1)
Basophils Relative: 2 %
Eosinophils Absolute: 0.1 10*3/uL (ref 0.0–0.5)
Eosinophils Relative: 3 %
HCT: 28.8 % — ABNORMAL LOW (ref 36.0–46.0)
Hemoglobin: 9.2 g/dL — ABNORMAL LOW (ref 12.0–15.0)
Immature Granulocytes: 0 %
Lymphocytes Relative: 28 %
Lymphs Abs: 0.8 10*3/uL (ref 0.7–4.0)
MCH: 33.1 pg (ref 26.0–34.0)
MCHC: 31.9 g/dL (ref 30.0–36.0)
MCV: 103.6 fL — ABNORMAL HIGH (ref 80.0–100.0)
Monocytes Absolute: 0.2 10*3/uL (ref 0.1–1.0)
Monocytes Relative: 7 %
Neutro Abs: 1.7 10*3/uL (ref 1.7–7.7)
Neutrophils Relative %: 60 %
Platelets: 275 10*3/uL (ref 150–400)
RBC: 2.78 MIL/uL — ABNORMAL LOW (ref 3.87–5.11)
RDW: 18.1 % — ABNORMAL HIGH (ref 11.5–15.5)
WBC: 2.8 10*3/uL — ABNORMAL LOW (ref 4.0–10.5)
nRBC: 0 % (ref 0.0–0.2)

## 2020-04-19 MED ORDER — DENOSUMAB 120 MG/1.7ML ~~LOC~~ SOLN
120.0000 mg | Freq: Once | SUBCUTANEOUS | Status: AC
  Administered 2020-04-19: 120 mg via SUBCUTANEOUS

## 2020-04-19 MED ORDER — DENOSUMAB 120 MG/1.7ML ~~LOC~~ SOLN
SUBCUTANEOUS | Status: AC
  Filled 2020-04-19: qty 1.7

## 2020-04-20 DIAGNOSIS — C50811 Malignant neoplasm of overlapping sites of right female breast: Secondary | ICD-10-CM | POA: Diagnosis not present

## 2020-04-20 DIAGNOSIS — R69 Illness, unspecified: Secondary | ICD-10-CM | POA: Diagnosis not present

## 2020-04-20 LAB — CANCER ANTIGEN 27.29: CA 27.29: 32.9 U/mL (ref 0.0–38.6)

## 2020-04-22 ENCOUNTER — Ambulatory Visit (HOSPITAL_COMMUNITY): Payer: Medicaid Other

## 2020-04-25 MED FILL — IBRANCE 100 MG TABS: 100 | 28 days supply | Qty: 21 | Fill #0

## 2020-04-26 DIAGNOSIS — R69 Illness, unspecified: Secondary | ICD-10-CM | POA: Diagnosis not present

## 2020-04-27 DIAGNOSIS — R69 Illness, unspecified: Secondary | ICD-10-CM | POA: Diagnosis not present

## 2020-04-27 DIAGNOSIS — S22000A Wedge compression fracture of unspecified thoracic vertebra, initial encounter for closed fracture: Secondary | ICD-10-CM | POA: Diagnosis not present

## 2020-04-28 DIAGNOSIS — R69 Illness, unspecified: Secondary | ICD-10-CM | POA: Diagnosis not present

## 2020-04-29 DIAGNOSIS — R69 Illness, unspecified: Secondary | ICD-10-CM | POA: Diagnosis not present

## 2020-05-02 DIAGNOSIS — C50811 Malignant neoplasm of overlapping sites of right female breast: Secondary | ICD-10-CM | POA: Diagnosis not present

## 2020-05-02 DIAGNOSIS — R69 Illness, unspecified: Secondary | ICD-10-CM | POA: Diagnosis not present

## 2020-05-03 DIAGNOSIS — C50811 Malignant neoplasm of overlapping sites of right female breast: Secondary | ICD-10-CM | POA: Diagnosis not present

## 2020-05-03 DIAGNOSIS — R69 Illness, unspecified: Secondary | ICD-10-CM | POA: Diagnosis not present

## 2020-05-04 ENCOUNTER — Ambulatory Visit (HOSPITAL_COMMUNITY)
Admission: RE | Admit: 2020-05-04 | Discharge: 2020-05-04 | Disposition: A | Discharge status: Home / Self Care | Payer: Medicaid Other | Source: Ambulatory Visit | Attending: Adult Health | Admitting: Adult Health

## 2020-05-04 ENCOUNTER — Other Ambulatory Visit: Payer: Self-pay

## 2020-05-04 DIAGNOSIS — C801 Malignant (primary) neoplasm, unspecified: Secondary | ICD-10-CM | POA: Diagnosis not present

## 2020-05-04 DIAGNOSIS — K8020 Calculus of gallbladder without cholecystitis without obstruction: Secondary | ICD-10-CM | POA: Diagnosis not present

## 2020-05-04 DIAGNOSIS — Z170 Estrogen receptor positive status [ER+]: Secondary | ICD-10-CM | POA: Diagnosis present

## 2020-05-04 DIAGNOSIS — C50811 Malignant neoplasm of overlapping sites of right female breast: Secondary | ICD-10-CM | POA: Diagnosis present

## 2020-05-04 DIAGNOSIS — C7951 Secondary malignant neoplasm of bone: Secondary | ICD-10-CM

## 2020-05-04 DIAGNOSIS — C7800 Secondary malignant neoplasm of unspecified lung: Secondary | ICD-10-CM | POA: Diagnosis present

## 2020-05-04 MED ORDER — SODIUM CHLORIDE (PF) 0.9 % IJ SOLN
INTRAMUSCULAR | Status: AC
  Filled 2020-05-04: qty 50

## 2020-05-04 MED ORDER — IOHEXOL 300 MG/ML  SOLN
100.0000 mL | Freq: Once | INTRAMUSCULAR | Status: AC | PRN
  Administered 2020-05-04: 100 mL via INTRAVENOUS

## 2020-05-05 DIAGNOSIS — R69 Illness, unspecified: Secondary | ICD-10-CM | POA: Diagnosis not present

## 2020-05-05 DIAGNOSIS — C50811 Malignant neoplasm of overlapping sites of right female breast: Secondary | ICD-10-CM | POA: Diagnosis not present

## 2020-05-06 ENCOUNTER — Telehealth: Payer: Self-pay | Admitting: *Deleted

## 2020-05-06 DIAGNOSIS — R69 Illness, unspecified: Secondary | ICD-10-CM | POA: Diagnosis not present

## 2020-05-06 DIAGNOSIS — C50811 Malignant neoplasm of overlapping sites of right female breast: Secondary | ICD-10-CM | POA: Diagnosis not present

## 2020-05-06 MED ORDER — FAMOTIDINE 20 MG PO TABS: 20.0000 mg | ORAL_TABLET | Freq: Every day | ORAL | 3 refills | Status: DC

## 2020-05-06 NOTE — Telephone Encounter (Signed)
Pt called stating ongoing " quesy - upset stomach with some cramping  for about a month "  She states it was worse yesterday after CT due to having to drink the contrast.  She has used the zofran with some benefit .  This RN discussed above including CT results showing stable condition.  This RN discussed possible GERD/GI imbalance per current medications- advised use of pepcid and gas X.  Suzanne [name] verbalized understanding.  Prescription for pepcid sent to verified pharmacy.

## 2020-05-09 ENCOUNTER — Other Ambulatory Visit: Payer: Self-pay | Admitting: Family Medicine

## 2020-05-09 DIAGNOSIS — C50811 Malignant neoplasm of overlapping sites of right female breast: Secondary | ICD-10-CM | POA: Diagnosis not present

## 2020-05-09 DIAGNOSIS — R69 Illness, unspecified: Secondary | ICD-10-CM | POA: Diagnosis not present

## 2020-05-09 NOTE — Telephone Encounter (Signed)
Requested medication (s) are due for refill today: Yes  Requested medication (s) are on the active medication list: Yes  Last refill:  03/30/20  Future visit scheduled: No  Notes to clinic:  Unable to refill per protocol, last refill by historical provider     Requested Prescriptions  Pending Prescriptions Disp Refills   sertraline (ZOLOFT) 50 MG tablet [Pharmacy Med Name: SERTRALINE 50MG  TABLETS] 60 tablet     Sig: TAKE 2 TABLETS BY MOUTH EVERY DAY AT BEDTIME      Psychiatry:  Antidepressants - SSRI Failed - 05/09/2020  2:43 PM      Failed - Valid encounter within last 6 months    Recent Outpatient Visits           7 months ago Hypotension, unspecified hypotension type   [place], Amy J, NP   9 months ago Hypotension, unspecified hypotension type   [place] Fulp, [place], MD   9 months ago Cellulitis of toe of right foot   [place], MD   11 months ago Malignant neoplasm of overlapping sites of right breast in female, estrogen receptor positive ([place])   [place] Fulp, [place], MD   1 year ago Malignant neoplasm of overlapping sites of right breast in female, estrogen receptor positive ([place])   [facility] [place], [place], MD

## 2020-05-10 DIAGNOSIS — C50811 Malignant neoplasm of overlapping sites of right female breast: Secondary | ICD-10-CM | POA: Diagnosis not present

## 2020-05-10 DIAGNOSIS — R69 Illness, unspecified: Secondary | ICD-10-CM | POA: Diagnosis not present

## 2020-05-11 DIAGNOSIS — C50811 Malignant neoplasm of overlapping sites of right female breast: Secondary | ICD-10-CM | POA: Diagnosis not present

## 2020-05-11 DIAGNOSIS — R69 Illness, unspecified: Secondary | ICD-10-CM | POA: Diagnosis not present

## 2020-05-12 DIAGNOSIS — R69 Illness, unspecified: Secondary | ICD-10-CM | POA: Diagnosis not present

## 2020-05-12 DIAGNOSIS — C50811 Malignant neoplasm of overlapping sites of right female breast: Secondary | ICD-10-CM | POA: Diagnosis not present

## 2020-05-13 ENCOUNTER — Ambulatory Visit: Payer: Medicaid Other | Attending: Internal Medicine

## 2020-05-13 DIAGNOSIS — Z23 Encounter for immunization: Secondary | ICD-10-CM

## 2020-05-13 DIAGNOSIS — C50811 Malignant neoplasm of overlapping sites of right female breast: Secondary | ICD-10-CM | POA: Diagnosis not present

## 2020-05-13 DIAGNOSIS — R69 Illness, unspecified: Secondary | ICD-10-CM | POA: Diagnosis not present

## 2020-05-13 NOTE — Progress Notes (Signed)
   Covid-19 Vaccination Clinic  Name:  Suzanne [name]    MRN: [id number] DOB: February 26, 1961  05/13/2020  [name] was observed post Covid-19 immunization for 15 minutes without incident. She was provided with Vaccine Information Sheet and instruction to access the V-Safe system.   [name] was instructed to call 911 with any severe reactions post vaccine: Marland Kitchen Difficulty breathing  . Swelling of face and throat  . A fast heartbeat  . A bad rash all over body  . Dizziness and weakness   Immunizations Administered    Name Date Dose VIS Date Route   Pfizer COVID-19 Vaccine 05/13/2020  3:40 PM 0.3 mL 03/16/2020 Intramuscular   Manufacturer: [place]   Lot: [id number]   [place]: [id number]

## 2020-05-16 DIAGNOSIS — C50811 Malignant neoplasm of overlapping sites of right female breast: Secondary | ICD-10-CM | POA: Diagnosis not present

## 2020-05-16 DIAGNOSIS — R69 Illness, unspecified: Secondary | ICD-10-CM | POA: Diagnosis not present

## 2020-05-16 NOTE — Progress Notes (Signed)
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  Since her last visit, she underwent restaging CT chest, abdomen, pelvis on 05/04/2020, which was stable with no new or progressive findings.  Suzanne [name] continues on anastrozole.  She tolerates it well.  She had hot flashes initially but those almost completely resolved  She is also on palbociclib stably at 100 mg daily 21 days on 7 days off.  She has no side effects from this that she is aware of  She receives Xgeva every 4 weeks. She has no issues with receiving this, and denies any dental concerns.  She has not seen her dentist in some time however.  She does have a dentist and she says she will plan to see the dentist within the next 2 to 3 months.     REVIEW OF SYSTEMS: Suzanne [name] is not quite back to where she was 3 years ago she says but she is getting closer. She wears her oxygen somewhat irregularly, but certainly with activity it helps her be able to do more. She is now able to walk across their apartment, sometimes stand in the kitchen for a little time and do some dishes, and other activities she simply could not perform previously. Currently she denies pain. The candidal infection on her skin is improved although not entirely resolved. A detailed review of systems today was otherwise stable   COVID 19 VACCINATION STATUS: Status post Pfizer x2 with booster December 2021   HISTORY  OF CURRENT ILLNESS: From the original inpatient consult note:  Suzanne [name] is a 59 year old female from [place], [place] without significant past medical history with exception of obesity, remote history of gallstones, and remote history of mastitis.  The patient presented to the hospital on 10/26/2018 with a 3-week history of worsening shortness of breath and lower extremity edema.  The patient reported that her shortness of breath worsened with exertion.  Her lower extremity edema did not improve with elevation.  She also had an intermittent pr oductive cough with rare sputum production.  The patient was noted to be anemic with a hemoglobin of 4.5 with a clumped platelets noted on admission.  Stool for occult blood was positive.  Patient received 2 units of packed red blood cells.    The patient reported a rash under her breast x1 week which has been foul-smelling.  She also reported that she has some discomfort and thickening/hardness of her right breast for several months.  She states that her right breast is much larger than her left.  She notes some occasional bloody discharge from her breast once in a while.  A chest x-ray on admission showed diffuse bilateral interstitial opacity of unknown chronicity, findings could be related to interstitial inflammatory process, atypical/viral pneumonia, or possible metastatic disease.  There are also sclerotic and lytic lesions within the clavicles, bilateral ribs, shoulders which was concerning for diffuse few skeletal metastatic disease.    This prompted a CT scan of the chest with contrast which showed bilateral axillary and mediastinal  adenopathy, borderline hilar lymph nodes bilaterally, findings likely flecked metastatic lymphadenopathy, moderate bilateral pleural effusions, extensive interstitial thickening and reticulonodular opacities throughout the lungs, cannot exclude lymphangitic spread of tumor, diffuse skeletal metastases.  The patient's  subsequent history is as detailed below.   PAST MEDICAL HISTORY: Past Medical History:  Diagnosis Date  . Cancer ([place])   . Family history of breast cancer   . Family history of colon cancer   . Family history of melanoma   . Family history of prostate cancer   . Gallstones   . Glaucoma   . Mastitis    reports history of recurrent mastitis  . Metastatic breast cancer ([place])   . Obesity     PAST SURGICAL HISTORY: Past Surgical History:  Procedure Laterality Date  . GLAUCOMA SURGERY  late 1990's  . IR THORACENTESIS ASP PLEURAL SPACE W/IMG GUIDE  10/28/2018  . IR THORACENTESIS ASP PLEURAL SPACE W/IMG GUIDE  10/29/2018  . OPEN REDUCTION INTERNAL FIXATION (ORIF) DISTAL RADIAL FRACTURE Left 01/09/2019   Procedure: OPEN REDUCTION INTERNAL FIXATION (ORIF) HUMERAL FRACTURE;  Surgeon: Netta Cedars, MD;  Location: [facility];  Service: Orthopedics;  Laterality: Left;    FAMILY HISTORY: Family History  Problem Relation Age of Onset  . Breast cancer Sister        in her 52's-70s  . Heart disease Brother        CABG  . Heart disease Brother        CABG  . Skin cancer Brother        melanoma  . Colon cancer Cousin   . Heart attack Mother   . Heart attack Father   . Prostate cancer Brother   Patient's father passed away at age 66, and her mother at 40, both from heart attacks. The patient has 8 siblings, 6 brothers and 1 sister.  Her sister was diagnosed with breast cancer. One brother has prostate cancer, another brother had a melanoma removed.    GYNECOLOGIC HISTORY:  Menarche: 59 years old Age at first live birth: 59 years old [place] P 1 LMP age 14 Contraceptive: used for 2-3 years with no problems HRT never used  Hysterectomy? no   SOCIAL HISTORY:  Suzanne [name] worked for an Engineer, manufacturing systems. She is now on disability. She is widowed. She lives at home with her daughter.  Suzanne [name], 37, who is a Psychologist, occupational at [employer] center.  The patient has no grandchildren. She is not a Scientist, research (life sciences).   ADVANCED DIRECTIVES: not on file; she has the paperwork already. She intends to name her daughter as her HCPOA.   HEALTH MAINTENANCE: Social History   Tobacco Use  . Smoking status: Never Smoker  . Smokeless tobacco: Never Used  . Tobacco comment: second hand smoke exposure  Vaping Use  . Vaping Use: Never used  Substance Use Topics  . Alcohol use: No  . Drug use: No    Colonoscopy:   PAP:   Bone density:  Mammography:   No Known Allergies  Current Outpatient Medications  Medication Sig Dispense Refill  . albuterol (VENTOLIN HFA) 108 (90 Base) MCG/ACT inhaler Inhale 2 puffs into the lungs every 6 (six) hours as needed for wheezing or shortness of breath. 16 g 3  . anastrozole (ARIMIDEX) 1 MG tablet Take 1 tablet (1 mg total) by mouth daily. 30 tablet 3  . antiseptic oral rinse (BIOTENE) LIQD 15 mLs by Mouth Rinse route as needed for dry mouth.    . benzonatate (TESSALON)  200 MG capsule TAKE 1 CAPSULE BY MOUTH 3 TIMES DAILY IF NEEDED 30 capsule 0  . Blood Pressure Monitor MISC Use to check blood pressure twice per day and more often if not feeling well 1 each 0  . calcium-vitamin D (OSCAL WITH D) 500-200 MG-UNIT tablet Take 1 tablet by mouth 3 (three) times daily. 90 tablet 1  . carvedilol (COREG) 3.125 MG tablet Take 3.125 mg by mouth 2 (two) times daily with a meal. As directed    . famotidine (PEPCID) 20 MG tablet Take 1 tablet (20 mg total) by mouth daily. 30 tablet 3  . fluconazole (DIFLUCAN) 100 MG tablet Take 1 tablet (100 mg total) by mouth daily. Take medication for 10 days and then take only as needed. 90 tablet 3  . furosemide (LASIX) 20 MG tablet Take 1 tablet (20 mg total) by mouth daily. 90 tablet 4  . IBRANCE 100 MG tablet TAKE 1 TABLET (100 MG TOTAL) BY MOUTH DAILY. TAKE FOR 21 DAYS ON, 7 DAYS OFF, REPEAT EVERY 28 DAYS. 21 tablet 1  . ketoconazole (NIZORAL) 2 % cream Apply to affected areas daily as needed 30 g 6  . loratadine (CLARITIN) 10 MG tablet  Take 1 tablet (10 mg total) by mouth daily. 30 tablet 3  . nystatin (MYCOSTATIN/NYSTOP) powder Apply 1 application topically as needed.    . polyethylene glycol (MIRALAX / GLYCOLAX) 17 g packet Take 17 g by mouth as needed for constipation.    . sertraline (ZOLOFT) 50 MG tablet Take 75 mg by mouth at bedtime.    Marland Kitchen ZOFRAN 8 MG tablet Take 8 mg by mouth every 8 (eight) hours.     No current facility-administered medications for this visit.    OBJECTIVE: White woman examined in a wheelchair  Vitals:   05/17/20 1420  BP: 133/67  Pulse: 92  Resp: 18  Temp: 98.4 F (36.9 C)  SpO2: 92%     Body mass index is 40.86 kg/m.   Wt Readings from Last 3 Encounters:  05/17/20 202 lb 4.8 oz (91.8 kg)  04/07/20 200 lb (90.7 kg)  04/01/20 200 lb (90.7 kg)  ECOG FS:3  Sclerae unicteric, EOMs intact Wearing a mask No cervical or supraclavicular adenopathy Lungs no rales or rhonchi Heart regular rate and rhythm Abd soft, obese, nontender, positive bowel sounds MSK no focal spinal tenderness, no upper extremity lymphedema Neuro: nonfocal, well oriented, appropriate affect Breasts: I do not palpate a mass in either breast. There are no skin or nipple changes of concern. Both axillae are benign.   LAB RESULTS:  CMP     Component Value Date/Time   NA 142 05/17/2020 1344   K 4.0 05/17/2020 1344   CL 105 05/17/2020 1344   CO2 30 05/17/2020 1344   GLUCOSE 99 05/17/2020 1344   BUN 20 05/17/2020 1344   CREATININE 1.10 (H) 05/17/2020 1344   CREATININE 0.93 04/07/2020 1216   CALCIUM 9.7 05/17/2020 1344   PROT 7.0 05/17/2020 1344   ALBUMIN 3.5 05/17/2020 1344   AST 16 05/17/2020 1344   AST 16 04/07/2020 1216   ALT 9 05/17/2020 1344   ALT 6 04/07/2020 1216   ALKPHOS 104 05/17/2020 1344   BILITOT 0.3 05/17/2020 1344   BILITOT 0.5 04/07/2020 1216   GFRNONAA 57 (L) 05/17/2020 1344   GFRNONAA >60 04/07/2020 1216   GFRAA >60 02/24/2020 1454   GFRAA >60 07/24/2019 1453    No results found  for: TOTALPROTELP, ALBUMINELP, A1GS, A2GS, BETS, BETA2SER,  GAMS, MSPIKE, SPEI  No results found for: KPAFRELGTCHN, LAMBDASER, KAPLAMBRATIO  Lab Results  Component Value Date   WBC 2.8 (L) 05/17/2020   NEUTROABS 1.8 05/17/2020   HGB 7.9 (L) 05/17/2020   HCT 25.5 (L) 05/17/2020   MCV 108.1 (H) 05/17/2020   PLT 241 05/17/2020   No results found for: LABCA2  No components found for: QASTMH962  No results for input(s): INR in the last 168 hours.  No results found for: LABCA2  No results found for: IWL798  No results found for: CAN125  No results found for: XQJ194  Lab Results  Component Value Date   CA2729 32.9 04/19/2020    No components found for: HGQUANT  No results found for: CEA1 / No results found for: CEA1   No results found for: AFPTUMOR  No results found for: CHROMOGRNA  No results found for: HGBA, HGBA2QUANT, HGBFQUANT, HGBSQUAN (Hemoglobinopathy evaluation)   Lab Results  Component Value Date   LDH 244 (H) 12/25/2018    Lab Results  Component Value Date   IRON 137 10/26/2018   TIBC 384 10/26/2018   IRONPCTSAT 36 (H) 10/26/2018   (Iron and TIBC)  Lab Results  Component Value Date   FERRITIN 2,306 (H) 12/25/2018    Urinalysis    Component Value Date/Time   COLORURINE YELLOW 04/07/2020 [place] 04/07/2020 1216   LABSPEC 1.026 04/07/2020 1216   PHURINE 5.0 04/07/2020 1216   GLUCOSEU NEGATIVE 04/07/2020 1216   HGBUR NEGATIVE 04/07/2020 1216   BILIRUBINUR NEGATIVE 04/07/2020 1216   KETONESUR NEGATIVE 04/07/2020 1216   PROTEINUR NEGATIVE 04/07/2020 1216   NITRITE NEGATIVE 04/07/2020 1216   LEUKOCYTESUR SMALL (A) 04/07/2020 1216    STUDIES:  CT Chest W Contrast  Result Date: 05/06/2020 CLINICAL DATA:  History of right breast cancer. Restaging examination. EXAM: CT CHEST, ABDOMEN, AND PELVIS WITH CONTRAST TECHNIQUE: Multidetector CT imaging of the chest, abdomen and pelvis was performed following the standard protocol  during bolus administration of intravenous contrast. CONTRAST:  111m OMNIPAQUE IOHEXOL 300 MG/ML  SOLN COMPARISON:  12/24/2019 FINDINGS: CT CHEST FINDINGS Cardiovascular: The heart is normal in size. No pericardial effusion. The aorta is normal in caliber. No dissection. The branch vessels are patent. Stable coronary artery calcifications. Mediastinum/Nodes: No mediastinal or hilar mass or lymphadenopathy. There is a stable large hiatal hernia. Lungs/Pleura: Stable linear scarring changes in both lungs. No worrisome pulmonary nodules to suggest pulmonary metastatic disease. No pleural effusions or pleural lesions. Stable fairly marked eventration of the right hemidiaphragm with overlying atelectasis. Musculoskeletal: No breast masses are identified. Scratch no medial breast masses are identified. There is a stable 13 mm left axillary lymph image 23/2. No new or progressive findings. Stable appearing diffuse and extensive lytic and sclerotic osseous metastatic disease. Stable compression fractures at the thoracolumbar junction. CT ABDOMEN PELVIS FINDINGS Hepatobiliary: No hepatic lesions to suggest metastatic disease. Mildly distended gallbladder with numerous rim calcified gallstones. No findings for acute cholecystitis. No intra or extrahepatic biliary dilatation. Pancreas: No mass, inflammation or ductal dilatation. Spleen: Normal size. No focal lesions. Adrenals/Urinary Tract: The adrenal glands and kidneys are unremarkable. The bladder is normal. Stomach/Bowel: The stomach, duodenum, small and colon are unremarkable. No acute inflammatory changes, mass lesions or obstructive findings. The terminal ileum is normal. The appendix is normal. Vascular/Lymphatic: Stable age advanced atherosclerotic calcifications involving the aorta and iliac arteries but no aneurysm dissection. The branch vessels are patent. The major venous structures are patent. No mesenteric or retroperitoneal mass adenopathy.  Reproductive: The  uterus and ovaries are. Other: No pelvic mass or adenopathy. No free pelvic fluid collections. No inguinal mass or adenopathy. No abdominal wall hernia or subcutaneous lesions. Musculoskeletal: Stable appearing diffuse lytic and sclerotic osseous metastatic disease. No new pathologic fracture or spinal compromise. IMPRESSION: 1. Stable diffuse and extensive lytic and sclerotic osseous metastatic disease. No new or progressive findings. 2. Stable 13 mm left axillary lymph node. No new or progressive findings. 3. No findings for pulmonary metastatic disease. 4. Stable large hiatal hernia. 5. Cholelithiasis. 6. Stable age advanced atherosclerotic vascular disease. Aortic Atherosclerosis (ICD10-I70.0). Electronically Signed   By: Marijo Sanes M.D.   On: 05/06/2020 05:42   CT Abdomen Pelvis W Contrast  Result Date: 05/06/2020 CLINICAL DATA:  History of right breast cancer. Restaging examination. EXAM: CT CHEST, ABDOMEN, AND PELVIS WITH CONTRAST TECHNIQUE: Multidetector CT imaging of the chest, abdomen and pelvis was performed following the standard protocol during bolus administration of intravenous contrast. CONTRAST:  125m OMNIPAQUE IOHEXOL 300 MG/ML  SOLN COMPARISON:  12/24/2019 FINDINGS: CT CHEST FINDINGS Cardiovascular: The heart is normal in size. No pericardial effusion. The aorta is normal in caliber. No dissection. The branch vessels are patent. Stable coronary artery calcifications. Mediastinum/Nodes: No mediastinal or hilar mass or lymphadenopathy. There is a stable large hiatal hernia. Lungs/Pleura: Stable linear scarring changes in both lungs. No worrisome pulmonary nodules to suggest pulmonary metastatic disease. No pleural effusions or pleural lesions. Stable fairly marked eventration of the right hemidiaphragm with overlying atelectasis. Musculoskeletal: No breast masses are identified. Scratch no medial breast masses are identified. There is a stable 13 mm left axillary lymph image 23/2. No new or  progressive findings. Stable appearing diffuse and extensive lytic and sclerotic osseous metastatic disease. Stable compression fractures at the thoracolumbar junction. CT ABDOMEN PELVIS FINDINGS Hepatobiliary: No hepatic lesions to suggest metastatic disease. Mildly distended gallbladder with numerous rim calcified gallstones. No findings for acute cholecystitis. No intra or extrahepatic biliary dilatation. Pancreas: No mass, inflammation or ductal dilatation. Spleen: Normal size. No focal lesions. Adrenals/Urinary Tract: The adrenal glands and kidneys are unremarkable. The bladder is normal. Stomach/Bowel: The stomach, duodenum, small and colon are unremarkable. No acute inflammatory changes, mass lesions or obstructive findings. The terminal ileum is normal. The appendix is normal. Vascular/Lymphatic: Stable age advanced atherosclerotic calcifications involving the aorta and iliac arteries but no aneurysm dissection. The branch vessels are patent. The major venous structures are patent. No mesenteric or retroperitoneal mass adenopathy. Reproductive: The uterus and ovaries are. Other: No pelvic mass or adenopathy. No free pelvic fluid collections. No inguinal mass or adenopathy. No abdominal wall hernia or subcutaneous lesions. Musculoskeletal: Stable appearing diffuse lytic and sclerotic osseous metastatic disease. No new pathologic fracture or spinal compromise. IMPRESSION: 1. Stable diffuse and extensive lytic and sclerotic osseous metastatic disease. No new or progressive findings. 2. Stable 13 mm left axillary lymph node. No new or progressive findings. 3. No findings for pulmonary metastatic disease. 4. Stable large hiatal hernia. 5. Cholelithiasis. 6. Stable age advanced atherosclerotic vascular disease. Aortic Atherosclerosis (ICD10-I70.0). Electronically Signed   By: PMarijo SanesM.D.   On: 05/06/2020 05:42     ELIGIBLE FOR AVAILABLE RESEARCH PROTOCOL: no   ASSESSMENT: 59y.o. [place] presenting 10/26/2018 with right-sided inflammatory breast cancer, stage IV, involvnh lungs, lymph nodes and bones, as follows:  (a) chest CT scan 10/26/2018 shows bilateral pleural effusions, possible lymphangitic spread of tumor, diffuse bony metastatic disease, and significant axillary mediastinal and hilar adenopathy  (  b) bone scan 10/27/2018 is a "near SuperScan" consistent with widespread bony metastatic disease  (c) head CT with and without contrast 10/30/2018 shows no intracranial metastatic disease, multiple calvarial lesions  (d) CA-27-29 on 10/27/2018 was 1810.4  (1) pleural fluid from right thoracentesis 10/28/2018 confirms malignant cells consistent with a breast primary, strongly estrogen and progesterone receptor positive, HER-2 not amplified, with an MIB-1 of 2%  (2) anastrozole started 10/29/2018  (a) palbociclib to start 11/13/2018 at 100 mg dose  (b) denosumab/xgeva started 11/13/2018   (3) associated problems:  (a) hypoxia secondary to effusions  (b) pain from bone lesions and left humeral and vertebral compression fractures  (c) right upper extremity lymphedema  (d) anemia, requiring transfusion  (e) poor venous access  (4) genetics testing on 12/25/2018 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 STK11, TP53, TSC1, TSC2, and VHL.  The following genes were evaluated for sequence changes only: SDHA and HOXB13 c.251G>A variant only. The [facility] Melanoma  Panel analyzed the following 9 genes: BAP1 BRCA2 CDK4 CDKN2A MITF POT1 PTEN RB1 Tp53.   (5) palliative radiation to the left humerus 07/20/2019 - 07/31/2019 Site Technique Total Dose (Gy) Dose per Fx (Gy) Completed Fx Beam Energies  Humerus, Left: Ext_Lt_humerus Complex 30/30 3 10/10 6X    PLAN: Havilah is now a year and a half out from initial diagnosis of metastatic breast cancer. Her disease is well controlled and the recent CT scan showed no evidence of active disease. Specifically there are no new bone spots, no involvement of the lungs or liver.  She has been able to tolerate the palbociclib quite well with only 1 dose reduction so far. We are continuing this medication together with her anastrozole and Xgeva.  She understands the importance of seeing her dentist regularly to help prevent problems with osteonecrosis of the jaw.  Otherwise she will see [place] again in 3 months while continuing her Xgeva every 4 weeks. She will see me in 6 months and before that visit she will have mammography at the [facility].  They know to call for any other issue that may develop before the next visit  Total encounter time 35 minutes.Sarajane Jews C. Taiten Brawn, MD 05/17/20 5:45 PM Medical Oncology and Hematology [facility] [place] [place] [address] Tel. [phone]    Fax. [phone]   I, Wilburn Mylar, am acting as scribe for Dr. Virgie Dad. Kyriaki Moder.  I, Lurline Del MD, have reviewed the above documentation for accuracy and completeness, and I agree with the above.    *Total Encounter Time as defined by the Centers for Medicare and Medicaid Services includes, in addition to the face-to-face time of a patient visit (documented in the note above) non-face-to-face time: obtaining and reviewing outside history, ordering and reviewing medications, tests or procedures, care coordination (communications with other health care professionals or caregivers) and  documentation in the medical record.

## 2020-05-17 ENCOUNTER — Other Ambulatory Visit: Payer: Self-pay

## 2020-05-17 ENCOUNTER — Other Ambulatory Visit: Payer: Self-pay | Admitting: Oncology

## 2020-05-17 ENCOUNTER — Inpatient Hospital Stay: Payer: Medicaid Other

## 2020-05-17 ENCOUNTER — Inpatient Hospital Stay (HOSPITAL_BASED_OUTPATIENT_CLINIC_OR_DEPARTMENT_OTHER): Payer: Medicaid Other | Admitting: Oncology

## 2020-05-17 ENCOUNTER — Inpatient Hospital Stay: Payer: Medicaid Other | Attending: Adult Health

## 2020-05-17 ENCOUNTER — Ambulatory Visit: Payer: Medicaid Other

## 2020-05-17 VITALS — BP 133/67 | HR 92 | Temp 98.4°F | Resp 18 | Ht 59.0 in | Wt 202.3 lb

## 2020-05-17 DIAGNOSIS — R0602 Shortness of breath: Secondary | ICD-10-CM

## 2020-05-17 DIAGNOSIS — C7951 Secondary malignant neoplasm of bone: Secondary | ICD-10-CM

## 2020-05-17 DIAGNOSIS — Z170 Estrogen receptor positive status [ER+]: Secondary | ICD-10-CM

## 2020-05-17 DIAGNOSIS — C50911 Malignant neoplasm of unspecified site of right female breast: Secondary | ICD-10-CM

## 2020-05-17 DIAGNOSIS — D649 Anemia, unspecified: Secondary | ICD-10-CM

## 2020-05-17 DIAGNOSIS — C799 Secondary malignant neoplasm of unspecified site: Secondary | ICD-10-CM | POA: Diagnosis not present

## 2020-05-17 DIAGNOSIS — J90 Pleural effusion, not elsewhere classified: Secondary | ICD-10-CM

## 2020-05-17 DIAGNOSIS — C50811 Malignant neoplasm of overlapping sites of right female breast: Secondary | ICD-10-CM

## 2020-05-17 DIAGNOSIS — C7801 Secondary malignant neoplasm of right lung: Secondary | ICD-10-CM

## 2020-05-17 DIAGNOSIS — G893 Neoplasm related pain (acute) (chronic): Secondary | ICD-10-CM

## 2020-05-17 DIAGNOSIS — C7800 Secondary malignant neoplasm of unspecified lung: Secondary | ICD-10-CM

## 2020-05-17 LAB — COMPREHENSIVE METABOLIC PANEL WITH GFR
ALT: 9 U/L (ref 0–44)
AST: 16 U/L (ref 15–41)
Albumin: 3.5 g/dL (ref 3.5–5.0)
Alkaline Phosphatase: 104 U/L (ref 38–126)
Anion gap: 7 (ref 5–15)
BUN: 20 mg/dL (ref 8–23)
CO2: 30 mmol/L (ref 22–32)
Calcium: 9.7 mg/dL (ref 8.9–10.3)
Chloride: 105 mmol/L (ref 98–111)
Creatinine, Ser: 1.1 mg/dL — ABNORMAL HIGH (ref 0.44–1.00)
GFR, Estimated: 57 mL/min — ABNORMAL LOW (ref >60)
Glucose, Bld: 99 mg/dL (ref 70–99)
Potassium: 4 mmol/L (ref 3.5–5.1)
Sodium: 142 mmol/L (ref 135–145)
Total Bilirubin: 0.3 mg/dL (ref 0.3–1.2)
Total Protein: 7 g/dL (ref 6.5–8.1)

## 2020-05-17 LAB — CBC WITH DIFFERENTIAL/PLATELET
Abs Immature Granulocytes: 0.02 10*3/uL (ref 0.00–0.07)
Basophils Absolute: 0.1 10*3/uL (ref 0.0–0.1)
Basophils Relative: 2 %
Eosinophils Absolute: 0.1 10*3/uL (ref 0.0–0.5)
Eosinophils Relative: 2 %
HCT: 25.5 % — ABNORMAL LOW (ref 36.0–46.0)
Hemoglobin: 7.9 g/dL — ABNORMAL LOW (ref 12.0–15.0)
Immature Granulocytes: 1 %
Lymphocytes Relative: 23 %
Lymphs Abs: 0.6 10*3/uL — ABNORMAL LOW (ref 0.7–4.0)
MCH: 33.5 pg (ref 26.0–34.0)
MCHC: 31 g/dL (ref 30.0–36.0)
MCV: 108.1 fL — ABNORMAL HIGH (ref 80.0–100.0)
Monocytes Absolute: 0.2 10*3/uL (ref 0.1–1.0)
Monocytes Relative: 6 %
Neutro Abs: 1.8 10*3/uL (ref 1.7–7.7)
Neutrophils Relative %: 66 %
Platelets: 241 10*3/uL (ref 150–400)
RBC: 2.36 MIL/uL — ABNORMAL LOW (ref 3.87–5.11)
RDW: 19.3 % — ABNORMAL HIGH (ref 11.5–15.5)
WBC: 2.8 10*3/uL — ABNORMAL LOW (ref 4.0–10.5)
nRBC: 0 % (ref 0.0–0.2)

## 2020-05-17 MED ORDER — DENOSUMAB 120 MG/1.7ML ~~LOC~~ SOLN
SUBCUTANEOUS | Status: AC
  Filled 2020-05-17: qty 1.7

## 2020-05-17 MED ORDER — ANASTROZOLE 1 MG PO TABS: 1.0000 mg | ORAL_TABLET | Freq: Every day | ORAL | 3 refills | Status: DC

## 2020-05-17 MED ORDER — PALBOCICLIB 100 MG PO TABS: ORAL_TABLET | ORAL | 8 refills | Status: DC

## 2020-05-17 MED ORDER — DENOSUMAB 120 MG/1.7ML ~~LOC~~ SOLN
120.0000 mg | Freq: Once | SUBCUTANEOUS | Status: AC
  Administered 2020-05-17: 120 mg via SUBCUTANEOUS

## 2020-05-17 NOTE — Patient Instructions (Signed)
Denosumab injection What is this medicine? DENOSUMAB (den oh sue mab) slows bone breakdown. Prolia is used to treat osteoporosis in women after menopause and in men, and in people who are taking corticosteroids for 6 months or more. Xgeva is used to treat a high calcium level due to cancer and to prevent bone fractures and other bone problems caused by multiple myeloma or cancer bone metastases. Xgeva is also used to treat giant cell tumor of the bone. This medicine may be used for other purposes; ask your health care provider or pharmacist if you have questions. COMMON BRAND NAME(S): Prolia, XGEVA What should I tell my health care provider before I take this medicine? They need to know if you have any of these conditions:  dental disease  having surgery or tooth extraction  infection  kidney disease  low levels of calcium or Vitamin D in the blood  malnutrition  on hemodialysis  skin conditions or sensitivity  thyroid or parathyroid disease  an unusual reaction to denosumab, other medicines, foods, dyes, or preservatives  pregnant or trying to get pregnant  breast-feeding How should I use this medicine? This medicine is for injection under the skin. It is given by a health care professional in a hospital or clinic setting. A special MedGuide will be given to you before each treatment. Be sure to read this information carefully each time. For Prolia, talk to your pediatrician regarding the use of this medicine in children. Special care may be needed. For Xgeva, talk to your pediatrician regarding the use of this medicine in children. While this drug may be prescribed for children as young as 13 years for selected conditions, precautions do apply. Overdosage: If you think you have taken too much of this medicine contact a poison control center or emergency room at once. NOTE: This medicine is only for you. Do not share this medicine with others. What if I miss a dose? It is  important not to miss your dose. Call your doctor or health care professional if you are unable to keep an appointment. What may interact with this medicine? Do not take this medicine with any of the following medications:  other medicines containing denosumab This medicine may also interact with the following medications:  medicines that lower your chance of fighting infection  steroid medicines like prednisone or cortisone This list may not describe all possible interactions. Give your health care provider a list of all the medicines, herbs, non-prescription drugs, or dietary supplements you use. Also tell them if you smoke, drink alcohol, or use illegal drugs. Some items may interact with your medicine. What should I watch for while using this medicine? Visit your doctor or health care professional for regular checks on your progress. Your doctor or health care professional may order blood tests and other tests to see how you are doing. Call your doctor or health care professional for advice if you get a fever, chills or sore throat, or other symptoms of a cold or flu. Do not treat yourself. This drug may decrease your body's ability to fight infection. Try to avoid being around people who are sick. You should make sure you get enough calcium and vitamin D while you are taking this medicine, unless your doctor tells you not to. Discuss the foods you eat and the vitamins you take with your health care professional. See your dentist regularly. Brush and floss your teeth as directed. Before you have any dental work done, tell your dentist you are   receiving this medicine. Do not become pregnant while taking this medicine or for 5 months after stopping it. Talk with your doctor or health care professional about your birth control options while taking this medicine. Women should inform their doctor if they wish to become pregnant or think they might be pregnant. There is a potential for serious side  effects to an unborn child. Talk to your health care professional or pharmacist for more information. What side effects may I notice from receiving this medicine? Side effects that you should report to your doctor or health care professional as soon as possible:  allergic reactions like skin rash, itching or hives, swelling of the face, lips, or tongue  bone pain  breathing problems  dizziness  jaw pain, especially after dental work  redness, blistering, peeling of the skin  signs and symptoms of infection like fever or chills; cough; sore throat; pain or trouble passing urine  signs of low calcium like fast heartbeat, muscle cramps or muscle pain; pain, tingling, numbness in the hands or feet; seizures  unusual bleeding or bruising  unusually weak or tired Side effects that usually do not require medical attention (report to your doctor or health care professional if they continue or are bothersome):  constipation  diarrhea  headache  joint pain  loss of appetite  muscle pain  runny nose  tiredness  upset stomach This list may not describe all possible side effects. Call your doctor for medical advice about side effects. You may report side effects to FDA at 1-800-FDA-1088. Where should I keep my medicine? This medicine is only given in a clinic, doctor's office, or other health care setting and will not be stored at home. NOTE: This sheet is a summary. It may not cover all possible information. If you have questions about this medicine, talk to your doctor, pharmacist, or health care provider.  2020 Elsevier/Gold Standard (2017-09-20 16:10:44)

## 2020-05-18 DIAGNOSIS — C50811 Malignant neoplasm of overlapping sites of right female breast: Secondary | ICD-10-CM | POA: Diagnosis not present

## 2020-05-18 DIAGNOSIS — R69 Illness, unspecified: Secondary | ICD-10-CM | POA: Diagnosis not present

## 2020-05-18 LAB — CANCER ANTIGEN 27.29: CA 27.29: 21.5 U/mL (ref 0.0–38.6)

## 2020-05-19 DIAGNOSIS — R69 Illness, unspecified: Secondary | ICD-10-CM | POA: Diagnosis not present

## 2020-05-19 DIAGNOSIS — C50811 Malignant neoplasm of overlapping sites of right female breast: Secondary | ICD-10-CM | POA: Diagnosis not present

## 2020-05-20 DIAGNOSIS — C50811 Malignant neoplasm of overlapping sites of right female breast: Secondary | ICD-10-CM | POA: Diagnosis not present

## 2020-05-20 DIAGNOSIS — R69 Illness, unspecified: Secondary | ICD-10-CM | POA: Diagnosis not present

## 2020-05-23 DIAGNOSIS — C50811 Malignant neoplasm of overlapping sites of right female breast: Secondary | ICD-10-CM | POA: Diagnosis not present

## 2020-05-23 DIAGNOSIS — R69 Illness, unspecified: Secondary | ICD-10-CM | POA: Diagnosis not present

## 2020-05-24 DIAGNOSIS — C50811 Malignant neoplasm of overlapping sites of right female breast: Secondary | ICD-10-CM | POA: Diagnosis not present

## 2020-05-24 DIAGNOSIS — R69 Illness, unspecified: Secondary | ICD-10-CM | POA: Diagnosis not present

## 2020-05-25 ENCOUNTER — Other Ambulatory Visit: Payer: Self-pay | Admitting: *Deleted

## 2020-05-25 MED ORDER — SERTRALINE HCL 50 MG PO TABS: 75.0000 mg | ORAL_TABLET | Freq: Every day | ORAL | 2 refills | Status: DC

## 2020-05-25 MED FILL — IBRANCE 100 MG TABS: 100 | 28 days supply | Qty: 21 | Fill #1

## 2020-05-26 DIAGNOSIS — R69 Illness, unspecified: Secondary | ICD-10-CM | POA: Diagnosis not present

## 2020-05-26 DIAGNOSIS — C50811 Malignant neoplasm of overlapping sites of right female breast: Secondary | ICD-10-CM | POA: Diagnosis not present

## 2020-05-27 DIAGNOSIS — R69 Illness, unspecified: Secondary | ICD-10-CM | POA: Diagnosis not present

## 2020-05-27 DIAGNOSIS — C50811 Malignant neoplasm of overlapping sites of right female breast: Secondary | ICD-10-CM | POA: Diagnosis not present

## 2020-05-28 DIAGNOSIS — S22000A Wedge compression fracture of unspecified thoracic vertebra, initial encounter for closed fracture: Secondary | ICD-10-CM | POA: Diagnosis not present

## 2020-05-30 DIAGNOSIS — R69 Illness, unspecified: Secondary | ICD-10-CM | POA: Diagnosis not present

## 2020-05-31 DIAGNOSIS — R69 Illness, unspecified: Secondary | ICD-10-CM | POA: Diagnosis not present

## 2020-06-01 DIAGNOSIS — R69 Illness, unspecified: Secondary | ICD-10-CM | POA: Diagnosis not present

## 2020-06-02 DIAGNOSIS — R69 Illness, unspecified: Secondary | ICD-10-CM | POA: Diagnosis not present

## 2020-06-07 DIAGNOSIS — R69 Illness, unspecified: Secondary | ICD-10-CM | POA: Diagnosis not present

## 2020-06-08 ENCOUNTER — Telehealth: Payer: Self-pay | Admitting: Oncology

## 2020-06-08 DIAGNOSIS — R69 Illness, unspecified: Secondary | ICD-10-CM | POA: Diagnosis not present

## 2020-06-08 NOTE — Telephone Encounter (Signed)
scheduled appt per 1/11 sch msg - pt to get an updated schedule next visit.

## 2020-06-09 DIAGNOSIS — R69 Illness, unspecified: Secondary | ICD-10-CM | POA: Diagnosis not present

## 2020-06-09 DIAGNOSIS — C50811 Malignant neoplasm of overlapping sites of right female breast: Secondary | ICD-10-CM | POA: Diagnosis not present

## 2020-06-10 DIAGNOSIS — R69 Illness, unspecified: Secondary | ICD-10-CM | POA: Diagnosis not present

## 2020-06-14 ENCOUNTER — Inpatient Hospital Stay: Payer: Medicaid Other

## 2020-06-15 ENCOUNTER — Telehealth: Payer: Self-pay | Admitting: *Deleted

## 2020-06-15 DIAGNOSIS — C50811 Malignant neoplasm of overlapping sites of right female breast: Secondary | ICD-10-CM | POA: Diagnosis not present

## 2020-06-15 NOTE — Telephone Encounter (Signed)
Received call from daughter concerned about appts.  They missed appt yest d/t weather.  She thinks her mother is anemic b/c she is sleeping a lot.  Message to scheduler to r/s ASAP.  Daughter will call to schedule mammogram.

## 2020-06-16 DIAGNOSIS — C50811 Malignant neoplasm of overlapping sites of right female breast: Secondary | ICD-10-CM | POA: Diagnosis not present

## 2020-06-17 DIAGNOSIS — C50811 Malignant neoplasm of overlapping sites of right female breast: Secondary | ICD-10-CM | POA: Diagnosis not present

## 2020-06-20 DIAGNOSIS — C50811 Malignant neoplasm of overlapping sites of right female breast: Secondary | ICD-10-CM | POA: Diagnosis not present

## 2020-06-21 DIAGNOSIS — C50811 Malignant neoplasm of overlapping sites of right female breast: Secondary | ICD-10-CM | POA: Diagnosis not present

## 2020-06-22 ENCOUNTER — Telehealth: Payer: Self-pay | Admitting: *Deleted

## 2020-06-22 DIAGNOSIS — C50811 Malignant neoplasm of overlapping sites of right female breast: Secondary | ICD-10-CM | POA: Diagnosis not present

## 2020-06-22 NOTE — Telephone Encounter (Signed)
Received call from pt's daughter stating they have a new problem.  Pt is having clear drainage from her breast.  She states not from nipple or areola.  It is from the skin below.  She denies pain, redness, or fever.  She is coming in tomorrow & encouraged to keep that appt.  She had called earlier in the day stating pt's energy level has gotten worse.  Message to Dr Jana Hakim.

## 2020-06-23 ENCOUNTER — Other Ambulatory Visit: Payer: Self-pay | Admitting: *Deleted

## 2020-06-23 ENCOUNTER — Inpatient Hospital Stay: Payer: Medicaid Other

## 2020-06-23 ENCOUNTER — Inpatient Hospital Stay: Payer: Medicaid Other | Attending: Adult Health

## 2020-06-23 ENCOUNTER — Other Ambulatory Visit: Payer: Self-pay | Admitting: Oncology

## 2020-06-23 ENCOUNTER — Inpatient Hospital Stay (HOSPITAL_BASED_OUTPATIENT_CLINIC_OR_DEPARTMENT_OTHER): Payer: Medicaid Other | Admitting: Oncology

## 2020-06-23 ENCOUNTER — Other Ambulatory Visit: Payer: Self-pay

## 2020-06-23 DIAGNOSIS — D649 Anemia, unspecified: Secondary | ICD-10-CM | POA: Diagnosis present

## 2020-06-23 DIAGNOSIS — C50811 Malignant neoplasm of overlapping sites of right female breast: Secondary | ICD-10-CM

## 2020-06-23 DIAGNOSIS — C779 Secondary and unspecified malignant neoplasm of lymph node, unspecified: Secondary | ICD-10-CM | POA: Insufficient documentation

## 2020-06-23 DIAGNOSIS — Z6841 Body Mass Index (BMI) 40.0 and over, adult: Secondary | ICD-10-CM

## 2020-06-23 DIAGNOSIS — Z170 Estrogen receptor positive status [ER+]: Secondary | ICD-10-CM

## 2020-06-23 DIAGNOSIS — C7951 Secondary malignant neoplasm of bone: Secondary | ICD-10-CM | POA: Diagnosis not present

## 2020-06-23 DIAGNOSIS — C50911 Malignant neoplasm of unspecified site of right female breast: Secondary | ICD-10-CM | POA: Diagnosis present

## 2020-06-23 DIAGNOSIS — C7801 Secondary malignant neoplasm of right lung: Secondary | ICD-10-CM

## 2020-06-23 DIAGNOSIS — R0602 Shortness of breath: Secondary | ICD-10-CM

## 2020-06-23 DIAGNOSIS — C7800 Secondary malignant neoplasm of unspecified lung: Secondary | ICD-10-CM | POA: Diagnosis not present

## 2020-06-23 DIAGNOSIS — J90 Pleural effusion, not elsewhere classified: Secondary | ICD-10-CM

## 2020-06-23 LAB — COMPREHENSIVE METABOLIC PANEL
ALT: <6 U/L (ref 0–44)
AST: 13 U/L — ABNORMAL LOW (ref 15–41)
Albumin: 3.5 g/dL (ref 3.5–5.0)
Alkaline Phosphatase: 95 U/L (ref 38–126)
Anion gap: 8 (ref 5–15)
BUN: 21 mg/dL (ref 8–23)
CO2: 26 mmol/L (ref 22–32)
Calcium: 8.6 mg/dL — ABNORMAL LOW (ref 8.9–10.3)
Chloride: 109 mmol/L (ref 98–111)
Creatinine, Ser: 0.84 mg/dL (ref 0.44–1.00)
GFR, Estimated: >60 mL/min (ref >60)
Glucose, Bld: 136 mg/dL — ABNORMAL HIGH (ref 70–99)
Potassium: 3.6 mmol/L (ref 3.5–5.1)
Sodium: 143 mmol/L (ref 135–145)
Total Bilirubin: 0.4 mg/dL (ref 0.3–1.2)
Total Protein: 6.5 g/dL (ref 6.5–8.1)

## 2020-06-23 LAB — CBC WITH DIFFERENTIAL/PLATELET
Abs Immature Granulocytes: 0.01 10*3/uL (ref 0.00–0.07)
Basophils Absolute: 0 10*3/uL (ref 0.0–0.1)
Basophils Relative: 1 %
Eosinophils Absolute: 0 10*3/uL (ref 0.0–0.5)
Eosinophils Relative: 1 %
HCT: 14.4 % — ABNORMAL LOW (ref 36.0–46.0)
Hemoglobin: 4.3 g/dL — CL (ref 12.0–15.0)
Immature Granulocytes: 1 %
Lymphocytes Relative: 22 %
Lymphs Abs: 0.5 10*3/uL — ABNORMAL LOW (ref 0.7–4.0)
MCH: 33.3 pg (ref 26.0–34.0)
MCHC: 29.9 g/dL — ABNORMAL LOW (ref 30.0–36.0)
MCV: 111.6 fL — ABNORMAL HIGH (ref 80.0–100.0)
Monocytes Absolute: 0.2 10*3/uL (ref 0.1–1.0)
Monocytes Relative: 7 %
Neutro Abs: 1.4 10*3/uL — ABNORMAL LOW (ref 1.7–7.7)
Neutrophils Relative %: 68 %
Platelets: 153 10*3/uL (ref 150–400)
RBC: 1.29 MIL/uL — ABNORMAL LOW (ref 3.87–5.11)
RDW: 21.9 % — ABNORMAL HIGH (ref 11.5–15.5)
WBC: 2.1 10*3/uL — ABNORMAL LOW (ref 4.0–10.5)
nRBC: 0 % (ref 0.0–0.2)

## 2020-06-23 LAB — SAMPLE TO BLOOD BANK

## 2020-06-23 LAB — PREPARE RBC (CROSSMATCH)

## 2020-06-23 LAB — RETICULOCYTES
Immature Retic Fract: 29 % — ABNORMAL HIGH (ref 2.3–15.9)
RBC.: 1.27 MIL/uL — ABNORMAL LOW (ref 3.87–5.11)
Retic Count, Absolute: 57.7 10*3/uL (ref 19.0–186.0)
Retic Ct Pct: 4.5 % — ABNORMAL HIGH (ref 0.4–3.1)

## 2020-06-23 LAB — SAVE SMEAR(SSMR), FOR PROVIDER SLIDE REVIEW

## 2020-06-23 MED ORDER — DIPHENHYDRAMINE HCL 25 MG PO CAPS
ORAL_CAPSULE | ORAL | Status: AC
  Filled 2020-06-23: qty 1

## 2020-06-23 MED ORDER — DENOSUMAB 120 MG/1.7ML ~~LOC~~ SOLN
SUBCUTANEOUS | Status: AC
  Filled 2020-06-23: qty 1.7

## 2020-06-23 MED ORDER — ACETAMINOPHEN 325 MG PO TABS
ORAL_TABLET | ORAL | Status: AC
  Filled 2020-06-23: qty 2

## 2020-06-23 MED ORDER — ACETAMINOPHEN 325 MG PO TABS
650.0000 mg | ORAL_TABLET | Freq: Once | ORAL | Status: AC
  Administered 2020-06-23: 650 mg via ORAL

## 2020-06-23 MED ORDER — DIPHENHYDRAMINE HCL 25 MG PO CAPS
25.0000 mg | ORAL_CAPSULE | Freq: Once | ORAL | Status: AC
  Administered 2020-06-23: 25 mg via ORAL

## 2020-06-23 MED ORDER — SODIUM CHLORIDE 0.9% IV SOLUTION
250.0000 mL | Freq: Once | INTRAVENOUS | Status: AC
  Administered 2020-06-23: 250 mL via INTRAVENOUS
  Filled 2020-06-23: qty 250

## 2020-06-23 MED FILL — IBRANCE 100 MG TABS: 100 | 28 days supply | Qty: 21 | Fill #0

## 2020-06-23 NOTE — Progress Notes (Signed)
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  Suzanne [name] has been having some clear fluid oozing from the lower part of her right breast and that brought her in for evaluation.  We obtained lab work and found her hemoglobin was down to 4.3.  White cell count was 2.1 and platelets 153,000.  Since her last visit, she underwent restaging CT chest, abdomen, pelvis on 05/04/2020, which was stable with no new or progressive findings.  Suzanne [name] continues on anastrozole.  She tolerates it well.  She had hot flashes initially but those almost completely resolved  She is also on palbociclib stably at 100 mg daily 21 days on 7 days off.  She has no side effects from this that she is aware of  She receives Xgeva every 4 weeks. She has no issues with receiving this, and denies any dental concerns.  She has not seen her dentist in some time however.  She is aware of concerns regarding osteonecrosis of the jaw and is planning to see her dentist in the near future.  REVIEW OF SYSTEMS: Suzanne [name] has been feeling more fatigued recently.  She denies shortness of breath or chest pain.  There has been no fainting or falling.  She has had no bruising and no bleeding and specifically no bright red blood per rectum or melena, no hematochezia, no epistaxis.  She has had some clear oozing from the lower portion of the right breast which is the reason she came  today.  COVID 19 VACCINATION STATUS: Status post Pfizer x2 with booster December 2021   HISTORY OF CURRENT ILLNESS: From the original inpatient consult note:  Suzanne [name] is a 60 year old female from [place], [place] without significant past medical history with exception of obesity, remote history of gallstones, and remote history of mastitis.  The patient presented to the hospital on 10/26/2018 with a 3-week history of worsening shortness of breath and lower extremity edema.  The patient reported that her shortness of breath worsened with exertion.  Her lower extremity edema did not improve with elevation.  She also had an intermittent pr oductive cough with rare sputum production.  The patient was noted to be anemic with a hemoglobin of 4.5 with a clumped platelets noted on admission.  Stool for occult blood was positive.  Patient received 2 units of packed red blood cells.    The patient reported a rash under her breast x1 week which has been foul-smelling.  She also reported that she has some discomfort and thickening/hardness of her right breast for several months.  She states that her right breast is much larger than her left.  She notes some occasional bloody discharge from her breast once in a while.  A chest x-ray on admission showed diffuse bilateral interstitial opacity of unknown chronicity, findings could be related to interstitial inflammatory process, atypical/viral pneumonia, or possible metastatic disease.  There are also sclerotic and lytic lesions within the clavicles, bilateral ribs, shoulders which was concerning for diffuse few skeletal metastatic disease.  This prompted a CT scan of the chest with contrast which showed bilateral axillary and mediastinal adenopathy, borderline hilar lymph nodes bilaterally, findings likely flecked metastatic lymphadenopathy, moderate bilateral pleural effusions, extensive interstitial thickening and reticulonodular opacities throughout the  lungs, cannot exclude lymphangitic spread of tumor, diffuse skeletal metastases.  The patient's subsequent history is as detailed below.   PAST MEDICAL HISTORY: Past Medical History:  Diagnosis Date  . Cancer ([place])   . Family history of breast cancer   . Family history of colon cancer   . Family history of melanoma   . Family history of prostate cancer   . Gallstones   . Glaucoma   . Mastitis    reports history of recurrent mastitis  . Metastatic breast cancer ([place])   . Obesity     PAST SURGICAL HISTORY: Past Surgical History:  Procedure Laterality Date  . GLAUCOMA SURGERY  late 1990's  . IR THORACENTESIS ASP PLEURAL SPACE W/IMG GUIDE  10/28/2018  . IR THORACENTESIS ASP PLEURAL SPACE W/IMG GUIDE  10/29/2018  . OPEN REDUCTION INTERNAL FIXATION (ORIF) DISTAL RADIAL FRACTURE Left 01/09/2019   Procedure: OPEN REDUCTION INTERNAL FIXATION (ORIF) HUMERAL FRACTURE;  Surgeon: Netta Cedars, MD;  Location: [facility];  Service: Orthopedics;  Laterality: Left;    FAMILY HISTORY: Family History  Problem Relation Age of Onset  . Breast cancer Sister        in her 65's-70s  . Heart disease Brother        CABG  . Heart disease Brother        CABG  . Skin cancer Brother        melanoma  . Colon cancer Cousin   . Heart attack Mother   . Heart attack Father   . Prostate cancer Brother   Patient's father passed away at age 56, and her mother at 52, both from heart attacks. The patient has 8 siblings, 6 brothers and 1 sister.  Her sister was diagnosed with breast cancer. One brother has prostate cancer, another brother had a melanoma removed.    GYNECOLOGIC HISTORY:  Menarche: 60 years old Age at first live birth: 60 years old [place] P 1 LMP age 29 Contraceptive: used for 2-3 years with no problems HRT never used  Hysterectomy? no   SOCIAL HISTORY:  Suzanne [name] worked for an Engineer, manufacturing systems. She is now on disability. She is widowed. She lives at home with her daughter.  Suzanne [name], 37,  who is a Psychologist, occupational at [employer] center.  The patient has no grandchildren. She is not a Ambulance person.   ADVANCED DIRECTIVES: not on file; she has the paperwork already. She intends to name her daughter as her HCPOA.   HEALTH MAINTENANCE: Social History   Tobacco Use  . Smoking status: Never Smoker  . Smokeless tobacco: Never Used  . Tobacco comment: second hand smoke exposure  Vaping Use  . Vaping Use: Never used  Substance Use Topics  . Alcohol use: No  . Drug use: No    Colonoscopy:   PAP:   Bone density:  Mammography:   No Known Allergies  Current Outpatient Medications  Medication Sig Dispense Refill  . albuterol (VENTOLIN HFA) 108 (90 Base) MCG/ACT inhaler Inhale 2 puffs into the lungs every 6 (six) hours as needed for wheezing or shortness of breath. 16 g 3  . anastrozole (ARIMIDEX) 1 MG tablet Take 1 tablet (1 mg total) by mouth daily. 30 tablet 3  . antiseptic oral rinse (BIOTENE) LIQD 15  mLs by Mouth Rinse route as needed for dry mouth.    . benzonatate (TESSALON) 200 MG capsule TAKE 1 CAPSULE BY MOUTH 3 TIMES DAILY IF NEEDED 30 capsule 0  . Blood Pressure Monitor MISC Use to check blood pressure twice per day and more often if not feeling well 1 each 0  . calcium-vitamin D (OSCAL WITH D) 500-200 MG-UNIT tablet Take 1 tablet by mouth 3 (three) times daily. 90 tablet 1  . carvedilol (COREG) 3.125 MG tablet Take 3.125 mg by mouth 2 (two) times daily with a meal. As directed    . famotidine (PEPCID) 20 MG tablet Take 1 tablet (20 mg total) by mouth 2 (two) times daily as needed for heartburn or indigestion. 30 tablet 3  . fluconazole (DIFLUCAN) 100 MG tablet Take 1 tablet (100 mg total) by mouth daily. Take medication for 10 days and then take only as needed. 90 tablet 3  . furosemide (LASIX) 20 MG tablet Take 1 tablet (20 mg total) by mouth daily. 90 tablet 4  . ketoconazole (NIZORAL) 2 % cream Apply to affected areas daily as needed 30 g 6  . loratadine (CLARITIN)  10 MG tablet Take 1 tablet (10 mg total) by mouth daily. 30 tablet 3  . nystatin (MYCOSTATIN/NYSTOP) powder Apply 1 application topically as needed.    . palbociclib (IBRANCE) 100 MG tablet TAKE 1 TABLET (100 MG TOTAL) BY MOUTH DAILY. TAKE FOR 21 DAYS ON, 7 DAYS OFF, REPEAT EVERY 28 DAYS. 21 tablet 8  . polyethylene glycol (MIRALAX / GLYCOLAX) 17 g packet Take 17 g by mouth as needed for constipation.    . sertraline (ZOLOFT) 50 MG tablet Take 1.5 tablets (75 mg total) by mouth at bedtime. 30 tablet 2  . ZOFRAN 8 MG tablet Take 8 mg by mouth every 8 (eight) hours.     No current facility-administered medications for this visit.    OBJECTIVE: White woman examined in a wheelchair  There were no vitals filed for this visit.   There is no height or weight on file to calculate BMI.   Wt Readings from Last 3 Encounters:  05/17/20 202 lb 4.8 oz (91.8 kg)  04/07/20 200 lb (90.7 kg)  04/01/20 200 lb (90.7 kg)  ECOG FS: 2  Sclerae pale, EOMs intact Wearing a mask No cervical or supraclavicular adenopathy Lungs no rales or rhonchi Heart regular rate and rhythm Abd soft, obese, nontender, positive bowel sounds MSK no focal spinal tenderness, no upper extremity lymphedema Neuro: nonfocal, well oriented, appropriate affect Breasts: There is no palpable abnormality or visual change in the lower area of the right breast were the patient and her daughter tell me there was a little bit of clear fluid oozing earlier today. LAB RESULTS:  CMP     Component Value Date/Time   NA 143 06/23/2020 1343   K 3.6 06/23/2020 1343   CL 109 06/23/2020 1343   CO2 26 06/23/2020 1343   GLUCOSE 136 (H) 06/23/2020 1343   BUN 21 06/23/2020 1343   CREATININE 0.84 06/23/2020 1343   CREATININE 0.93 04/07/2020 1216   CALCIUM 8.6 (L) 06/23/2020 1343   PROT 6.5 06/23/2020 1343   ALBUMIN 3.5 06/23/2020 1343   AST 13 (L) 06/23/2020 1343   AST 16 04/07/2020 1216   ALT <6 06/23/2020 1343   ALT 6 04/07/2020 1216    ALKPHOS 95 06/23/2020 1343   BILITOT 0.4 06/23/2020 1343   BILITOT 0.5 04/07/2020 1216   GFRNONAA >60 06/23/2020 1343  GFRNONAA >60 04/07/2020 1216   GFRAA >60 02/24/2020 1454   GFRAA >60 07/24/2019 1453    No results found for: TOTALPROTELP, ALBUMINELP, A1GS, A2GS, BETS, BETA2SER, GAMS, MSPIKE, SPEI  No results found for: KPAFRELGTCHN, LAMBDASER, KAPLAMBRATIO  Lab Results  Component Value Date   WBC 2.1 (L) 06/23/2020   NEUTROABS 1.4 (L) 06/23/2020   HGB 4.3 (LL) 06/23/2020   HCT 14.4 (L) 06/23/2020   MCV 111.6 (H) 06/23/2020   PLT 153 06/23/2020   No results found for: LABCA2  No components found for: VOHYWV371  No results for input(s): INR in the last 168 hours.  No results found for: LABCA2  No results found for: GGY694  No results found for: CAN125  No results found for: WNI627  Lab Results  Component Value Date   CA2729 21.5 05/17/2020    No components found for: HGQUANT  No results found for: CEA1 / No results found for: CEA1   No results found for: AFPTUMOR  No results found for: [place]  No results found for: HGBA, HGBA2QUANT, HGBFQUANT, HGBSQUAN (Hemoglobinopathy evaluation)   Lab Results  Component Value Date   LDH 244 (H) 12/25/2018    Lab Results  Component Value Date   IRON 137 10/26/2018   TIBC 384 10/26/2018   IRONPCTSAT 36 (H) 10/26/2018   (Iron and TIBC)  Lab Results  Component Value Date   FERRITIN 2,306 (H) 12/25/2018    Urinalysis    Component Value Date/Time   COLORURINE YELLOW 04/07/2020 [place] 04/07/2020 1216   LABSPEC 1.026 04/07/2020 1216   PHURINE 5.0 04/07/2020 1216   GLUCOSEU NEGATIVE 04/07/2020 1216   HGBUR NEGATIVE 04/07/2020 1216   BILIRUBINUR NEGATIVE 04/07/2020 [place] 04/07/2020 1216   PROTEINUR NEGATIVE 04/07/2020 1216   NITRITE NEGATIVE 04/07/2020 1216   LEUKOCYTESUR SMALL (A) 04/07/2020 1216    STUDIES:  No results found.   ELIGIBLE FOR AVAILABLE  RESEARCH PROTOCOL: no   ASSESSMENT: 60 y.o. [place], [place] woman presenting 10/26/2018 with right-sided inflammatory breast cancer, stage IV, involvnh lungs, lymph nodes and bones, as follows:  (a) chest CT scan 10/26/2018 shows bilateral pleural effusions, possible lymphangitic spread of tumor, diffuse bony metastatic disease, and significant axillary mediastinal and hilar adenopathy  (b) bone scan 10/27/2018 is a "[place]" consistent with widespread bony metastatic disease  (c) head CT with and without contrast 10/30/2018 shows no intracranial metastatic disease, multiple calvarial lesions  (d) CA-27-29 on 10/27/2018 was 1810.4  (1) pleural fluid from right thoracentesis 10/28/2018 confirms malignant cells consistent with a breast primary, strongly estrogen and progesterone receptor positive, HER-2 not amplified, with an MIB-1 of 2%  (2) anastrozole started 10/29/2018  (a) palbociclib to start 11/13/2018 at 100 mg dose  (b) denosumab/xgeva started 11/13/2018   (3) associated problems:  (a) hypoxia secondary to effusions  (b) pain from bone lesions and left humeral and vertebral compression fractures  (c) right upper extremity lymphedema  (d) anemia, requiring transfusion  (e) poor venous access  (4) genetics testing on 12/25/2018 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 STK11, TP53, TSC1, TSC2, and VHL.  The following genes were evaluated for sequence changes  only: SDHA and HOXB13 c.251G>A variant only. The [facility] Melanoma Panel analyzed the following 9 genes: BAP1 BRCA2 CDK4 CDKN2A MITF POT1 PTEN RB1 Tp53.   (5) palliative radiation to the left humerus 07/20/2019 - 07/31/2019 Site Technique Total Dose (Gy) Dose per Fx (Gy) Completed Fx Beam Energies  Humerus, Left: Ext_Lt_humerus Complex 30/30 3 10/10 6X   (6) anemia requiring transfusion  PLAN: Kamaljit is now a little over a year and a half out from initial diagnosis of metastatic breast cancer with no evidence of disease activity.  This is favorable.  I am not sure why she is so severely anemic but certainly the Ibrance could contribute.  Note her kidney function is adequate, she has normal platelet count and bilirubin, and the reticulocyte count is inappropriately normal.  This is her off week in any case but I have asked her not to restart that medication.  We may have to find a different way of treating her if that is the chief cause.  She will receive 1 unit of blood today and 2 units of blood tomorrow.  I am putting her down to see me again in 2 weeks and will repeat lab work at that time.  She knows to call for any other problem that may develop before then.  Total encounter time 25 minutes.Sarajane Jews C. Lylia Karn, MD 06/23/20 6:18 PM Medical Oncology and Hematology [facility] [place], [place] [address] Tel. [phone]    Fax. [phone]   I, Wilburn Mylar, am acting as scribe for Dr. Virgie Dad. Demetric Dunnaway.  I, Lurline Del MD, have reviewed the above documentation for accuracy and completeness, and I agree with the above.    *Total Encounter Time as defined by the Centers for Medicare and Medicaid Services includes, in addition to the face-to-face time of a patient visit (documented in the note above) non-face-to-face time: obtaining and reviewing outside history, ordering and reviewing medications, tests or procedures, care coordination  (communications with other health care professionals or caregivers) and documentation in the medical record.

## 2020-06-23 NOTE — Progress Notes (Signed)
Ok to run blood at 414ml/hr per Dr. Jana Hakim.

## 2020-06-24 ENCOUNTER — Other Ambulatory Visit: Payer: Self-pay

## 2020-06-24 ENCOUNTER — Other Ambulatory Visit: Payer: Self-pay | Admitting: *Deleted

## 2020-06-24 ENCOUNTER — Inpatient Hospital Stay: Payer: Medicaid Other

## 2020-06-24 ENCOUNTER — Telehealth: Payer: Self-pay | Admitting: *Deleted

## 2020-06-24 VITALS — BP 135/68 | HR 78 | Temp 98.1°F | Resp 17

## 2020-06-24 DIAGNOSIS — C7951 Secondary malignant neoplasm of bone: Secondary | ICD-10-CM | POA: Diagnosis not present

## 2020-06-24 DIAGNOSIS — D649 Anemia, unspecified: Secondary | ICD-10-CM

## 2020-06-24 DIAGNOSIS — C50811 Malignant neoplasm of overlapping sites of right female breast: Secondary | ICD-10-CM

## 2020-06-24 DIAGNOSIS — Z170 Estrogen receptor positive status [ER+]: Secondary | ICD-10-CM

## 2020-06-24 LAB — CANCER ANTIGEN 27.29: CA 27.29: 21 U/mL (ref 0.0–38.6)

## 2020-06-24 MED ORDER — HEPARIN SOD (PORK) LOCK FLUSH 100 UNIT/ML IV SOLN
250.0000 [IU] | INTRAVENOUS | Status: DC | PRN
  Filled 2020-06-24: qty 5

## 2020-06-24 MED ORDER — ACETAMINOPHEN 325 MG PO TABS
ORAL_TABLET | ORAL | Status: AC
  Filled 2020-06-24: qty 2

## 2020-06-24 MED ORDER — DENOSUMAB 120 MG/1.7ML ~~LOC~~ SOLN
SUBCUTANEOUS | Status: AC
  Filled 2020-06-24: qty 1.7

## 2020-06-24 MED ORDER — DENOSUMAB 120 MG/1.7ML ~~LOC~~ SOLN
120.0000 mg | Freq: Once | SUBCUTANEOUS | Status: AC
  Administered 2020-06-24: 120 mg via SUBCUTANEOUS

## 2020-06-24 MED ORDER — DIPHENHYDRAMINE HCL 25 MG PO CAPS
25.0000 mg | ORAL_CAPSULE | Freq: Once | ORAL | Status: AC
  Administered 2020-06-24: 25 mg via ORAL

## 2020-06-24 MED ORDER — DIPHENHYDRAMINE HCL 25 MG PO CAPS
ORAL_CAPSULE | ORAL | Status: AC
  Filled 2020-06-24: qty 2

## 2020-06-24 MED ORDER — ACETAMINOPHEN 325 MG PO TABS: 650.0000 mg | ORAL_TABLET | Freq: Once | ORAL | Status: DC

## 2020-06-24 MED ORDER — ACETAMINOPHEN 325 MG PO TABS
650.0000 mg | ORAL_TABLET | Freq: Once | ORAL | Status: AC
  Administered 2020-06-24: 650 mg via ORAL

## 2020-06-24 MED ORDER — SODIUM CHLORIDE 0.9 % IV SOLN
Freq: Once | INTRAVENOUS | Status: AC
  Filled 2020-06-24: qty 250

## 2020-06-24 NOTE — Patient Instructions (Addendum)
Denosumab injection What is this medicine? DENOSUMAB (den oh sue mab) slows bone breakdown. Prolia is used to treat osteoporosis in women after menopause and in men, and in people who are taking corticosteroids for 6 months or more. Xgeva is used to treat a high calcium level due to cancer and to prevent bone fractures and other bone problems caused by multiple myeloma or cancer bone metastases. Xgeva is also used to treat giant cell tumor of the bone. This medicine may be used for other purposes; ask your health care provider or pharmacist if you have questions. COMMON BRAND NAME(S): Prolia, XGEVA What should I tell my health care provider before I take this medicine? They need to know if you have any of these conditions:  dental disease  having surgery or tooth extraction  infection  kidney disease  low levels of calcium or Vitamin D in the blood  malnutrition  on hemodialysis  skin conditions or sensitivity  thyroid or parathyroid disease  an unusual reaction to denosumab, other medicines, foods, dyes, or preservatives  pregnant or trying to get pregnant  breast-feeding How should I use this medicine? This medicine is for injection under the skin. It is given by a health care professional in a hospital or clinic setting. A special MedGuide will be given to you before each treatment. Be sure to read this information carefully each time. For Prolia, talk to your pediatrician regarding the use of this medicine in children. Special care may be needed. For Xgeva, talk to your pediatrician regarding the use of this medicine in children. While this drug may be prescribed for children as young as 13 years for selected conditions, precautions do apply. Overdosage: If you think you have taken too much of this medicine contact a poison control center or emergency room at once. NOTE: This medicine is only for you. Do not share this medicine with others. What if I miss a dose? It is  important not to miss your dose. Call your doctor or health care professional if you are unable to keep an appointment. What may interact with this medicine? Do not take this medicine with any of the following medications:  other medicines containing denosumab This medicine may also interact with the following medications:  medicines that lower your chance of fighting infection  steroid medicines like prednisone or cortisone This list may not describe all possible interactions. Give your health care provider a list of all the medicines, herbs, non-prescription drugs, or dietary supplements you use. Also tell them if you smoke, drink alcohol, or use illegal drugs. Some items may interact with your medicine. What should I watch for while using this medicine? Visit your doctor or health care professional for regular checks on your progress. Your doctor or health care professional may order blood tests and other tests to see how you are doing. Call your doctor or health care professional for advice if you get a fever, chills or sore throat, or other symptoms of a cold or flu. Do not treat yourself. This drug may decrease your body's ability to fight infection. Try to avoid being around people who are sick. You should make sure you get enough calcium and vitamin D while you are taking this medicine, unless your doctor tells you not to. Discuss the foods you eat and the vitamins you take with your health care professional. See your dentist regularly. Brush and floss your teeth as directed. Before you have any dental work done, tell your dentist you are   receiving this medicine. Do not become pregnant while taking this medicine or for 5 months after stopping it. Talk with your doctor or health care professional about your birth control options while taking this medicine. Women should inform their doctor if they wish to become pregnant or think they might be pregnant. There is a potential for serious side  effects to an unborn child. Talk to your health care professional or pharmacist for more information. What side effects may I notice from receiving this medicine? Side effects that you should report to your doctor or health care professional as soon as possible:  allergic reactions like skin rash, itching or hives, swelling of the face, lips, or tongue  bone pain  breathing problems  dizziness  jaw pain, especially after dental work  redness, blistering, peeling of the skin  signs and symptoms of infection like fever or chills; cough; sore throat; pain or trouble passing urine  signs of low calcium like fast heartbeat, muscle cramps or muscle pain; pain, tingling, numbness in the hands or feet; seizures  unusual bleeding or bruising  unusually weak or tired Side effects that usually do not require medical attention (report to your doctor or health care professional if they continue or are bothersome):  constipation  diarrhea  headache  joint pain  loss of appetite  muscle pain  runny nose  tiredness  upset stomach This list may not describe all possible side effects. Call your doctor for medical advice about side effects. You may report side effects to FDA at 1-800-FDA-1088. Where should I keep my medicine? This medicine is only given in a clinic, doctor's office, or other health care setting and will not be stored at home. NOTE: This sheet is a summary. It may not cover all possible information. If you have questions about this medicine, talk to your doctor, pharmacist, or health care provider.  2021 Elsevier/Gold Standard (2017-09-20 16:10:44)  Blood Transfusion, Adult A blood transfusion is a procedure in which you receive blood or a type of blood cell (blood component) through an IV. You may need a blood transfusion when your blood level is low. This may result from a bleeding disorder, illness, injury, or surgery. The blood may come from a donor. You may also be  able to donate blood for yourself (autologous blood donation) before a planned surgery. The blood given in a transfusion is made up of different blood components. You may receive:  Red blood cells. These carry oxygen to the cells in the body.  Platelets. These help your blood to clot.  Plasma. This is the liquid part of your blood. It carries proteins and other substances throughout the body.  White blood cells. These help you fight infections. If you have hemophilia or another clotting disorder, you may also receive other types of blood products. Tell a health care provider about:  Any blood disorders you have.  Any previous reactions you have had during a blood transfusion.  Any allergies you have.  All medicines you are taking, including vitamins, herbs, eye drops, creams, and over-the-counter medicines.  Any surgeries you have had.  Any medical conditions you have, including any recent fever or cold symptoms.  Whether you are pregnant or may be pregnant. What are the risks? Generally, this is a safe procedure. However, problems may occur.  The most common problems include: ? A mild allergic reaction, such as red, swollen areas of skin (hives) and itching. ? Fever or chills. This may be the body's response  to new blood cells received. This may occur during or up to 4 hours after the transfusion.  More serious problems may include: ? Transfusion-associated circulatory overload (TACO), or too much fluid in the lungs. This may cause breathing problems. ? A serious allergic reaction, such as difficulty breathing or swelling around the face and lips. ? Transfusion-related acute lung injury (TRALI), which causes breathing difficulty and low oxygen in the blood. This can occur within hours of the transfusion or several days later. ? Iron overload. This can happen after receiving many blood transfusions over a period of time. ? Infection or virus being transmitted. This is rare  because donated blood is carefully tested before it is given. ? Hemolytic transfusion reaction. This is rare. It happens when your body's defense system (immune system)tries to attack the new blood cells. Symptoms may include fever, chills, nausea, low blood pressure, and low back or chest pain. ? Transfusion-associated graft-versus-host disease (TAGVHD). This is rare. It happens when donated cells attack your body's healthy tissues. What happens before the procedure? Medicines Ask your health care provider about:  Changing or stopping your regular medicines. This is especially important if you are taking diabetes medicines or blood thinners.  Taking medicines such as aspirin and ibuprofen. These medicines can thin your blood. Do not take these medicines unless your health care provider tells you to take them.  Taking over-the-counter medicines, vitamins, herbs, and supplements. General instructions  Follow instructions from your health care provider about eating and drinking restrictions.  You will have a blood test to determine your blood type. This is necessary to know what kind of blood your body will accept and to match it to the donor blood.  If you are going to have a planned surgery, you may be able to do an autologous blood donation. This may be done in case you need to have a transfusion.  You will have your temperature, blood pressure, and pulse monitored before the transfusion.  If you have had an allergic reaction to a transfusion in the past, you may be given medicine to help prevent a reaction. This medicine may be given to you by mouth (orally) or through an IV.  Set aside time for the blood transfusion. This procedure generally takes 1-4 hours to complete. What happens during the procedure?  An IV will be inserted into one of your veins.  The bag of donated blood will be attached to your IV. The blood will then enter through your vein.  Your temperature, blood  pressure, and pulse will be monitored regularly during the transfusion. This monitoring is done to detect early signs of a transfusion reaction.  Tell your nurse right away if you have any of these symptoms during the transfusion: ? Shortness of breath or trouble breathing. ? Chest or back pain. ? Fever or chills. ? Hives or itching.  If you have any signs or symptoms of a reaction, your transfusion will be stopped and you may be given medicine.  When the transfusion is complete, your IV will be removed.  Pressure may be applied to the IV site for a few minutes.  A bandage (dressing)will be applied. The procedure may vary among health care providers and hospitals.   What happens after the procedure?  Your temperature, blood pressure, pulse, breathing rate, and blood oxygen level will be monitored until you leave the hospital or clinic.  Your blood may be tested to see how you are responding to the transfusion.  You may  be warmed with fluids or blankets to maintain a normal body temperature.  If you receive your blood transfusion in an outpatient setting, you will be told whom to contact to report any reactions. Where to find more information For more information on blood transfusions, visit the [employer]: redcross.org Summary  A blood transfusion is a procedure in which you receive blood or a type of blood cell (blood component) through an IV.  The blood you receive may come from a donor or be donated by yourself (autologous blood donation) before a planned surgery.  The blood given in a transfusion is made up of different blood components. You may receive red blood cells, platelets, plasma, or white blood cells depending on the condition treated.  Your temperature, blood pressure, and pulse will be monitored before, during, and after the transfusion.  After the transfusion, your blood may be tested to see how your body has responded. This information is not intended to  replace advice given to you by your health care provider. Make sure you discuss any questions you have with your health care provider. Document Revised: 03/19/2019 Document Reviewed: 11/06/2018 Elsevier Patient Education  [place].

## 2020-06-24 NOTE — Progress Notes (Signed)
Ok to give xgeva today per Dr. With calcium level of 8.6

## 2020-06-24 NOTE — Progress Notes (Signed)
Pt discharged in no apparent distress. Pt left in wheelchair same as how she arrived.  Pt aware of discharge instructions and verbalized understanding and had no further questions.

## 2020-06-25 LAB — TYPE AND SCREEN
ABO/RH(D): O POS
Antibody Screen: NEGATIVE
Unit division: 0
Unit division: 0
Unit division: 0

## 2020-06-25 LAB — BPAM RBC
Blood Product Expiration Date: 202202232359
Blood Product Expiration Date: 202202232359
Blood Product Expiration Date: 202202242359
ISSUE DATE / TIME: 202201271638
ISSUE DATE / TIME: 202201280908
ISSUE DATE / TIME: 202201280908
Unit Type and Rh: 5100
Unit Type and Rh: 5100
Unit Type and Rh: 5100

## 2020-06-28 DIAGNOSIS — J9621 Acute and chronic respiratory failure with hypoxia: Secondary | ICD-10-CM | POA: Diagnosis not present

## 2020-06-28 DIAGNOSIS — S22000A Wedge compression fracture of unspecified thoracic vertebra, initial encounter for closed fracture: Secondary | ICD-10-CM | POA: Diagnosis not present

## 2020-06-28 DIAGNOSIS — R0602 Shortness of breath: Secondary | ICD-10-CM | POA: Diagnosis not present

## 2020-06-28 DIAGNOSIS — C7801 Secondary malignant neoplasm of right lung: Secondary | ICD-10-CM | POA: Diagnosis not present

## 2020-06-29 ENCOUNTER — Telehealth: Payer: Self-pay | Admitting: Oncology

## 2020-06-29 NOTE — Telephone Encounter (Signed)
Informed patient of her upcoming appointment. Patient is aware. °

## 2020-07-04 DIAGNOSIS — C50811 Malignant neoplasm of overlapping sites of right female breast: Secondary | ICD-10-CM | POA: Diagnosis not present

## 2020-07-05 DIAGNOSIS — C50811 Malignant neoplasm of overlapping sites of right female breast: Secondary | ICD-10-CM | POA: Diagnosis not present

## 2020-07-06 DIAGNOSIS — C50811 Malignant neoplasm of overlapping sites of right female breast: Secondary | ICD-10-CM | POA: Diagnosis not present

## 2020-07-06 NOTE — Telephone Encounter (Signed)
Error. Josian Lanese M Kaliana Albino, RN  

## 2020-07-06 NOTE — Progress Notes (Signed)
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  At her last visit she was found to be severely anemic.  We gave her 2 units of blood and she had a significant improvement in her functional status.  We also suggest that she start over-the-counter iron, folate, and B12, but since these are not prescription medications to her pharmacy will not deliver them and her daughter [name] has not been able to pick them up yet.  Suzanne [name] continues on anastrozole.  Her hot flashes are now very minimal and vaginal dryness is not an issue.  She is also on palbociclib at 100 mg daily 21 days on 7 days off.  She was supposed to have started the current cycle but we held her [name] because of this cytopenias and specifically the severe anemia.  This is discussed further below.  She receives Niger every 4 weeks. She has no issues with receiving this, and denies any dental concerns.    REVIEW OF SYSTEMS: Suzanne [name] tells me she is "back to her baseline".  Usually she gets up in the morning feeds the cat makes her own breakfast and then mostly reads and watches TV during the day.  She does not go out very much.  She tells me she can walk at home from one end of the house to the other but does not usually go outside for any reason.  A detailed review of systems was otherwise stable   COVID 19 VACCINATION STATUS: Status post Pfizer x2 with booster December 2021   HISTORY OF CURRENT  ILLNESS: From the original inpatient consult note:  Suzanne [name] is a 60 year old female from [place], [place] without significant past medical history with exception of obesity, remote history of gallstones, and remote history of mastitis.  The patient presented to the hospital on 10/26/2018 with a 3-week history of worsening shortness of breath and lower extremity edema.  The patient reported that her shortness of breath worsened with exertion.  Her lower extremity edema did not improve with elevation.  She also had an intermittent pr oductive cough with rare sputum production.  The patient was noted to be anemic with a hemoglobin of 4.5 with a clumped platelets noted on admission.  Stool for occult blood was positive.  Patient received 2 units of packed red blood cells.    The patient reported a rash under her breast x1 week which has been foul-smelling.  She also reported that she has some discomfort and thickening/hardness of her right breast for several months.  She states that her right breast is much larger than her left.  She notes some occasional bloody discharge from her breast once in a while.  A chest x-ray on admission showed diffuse bilateral interstitial opacity of unknown chronicity, findings could be related to interstitial inflammatory process, atypical/viral pneumonia, or possible metastatic disease.  There are also sclerotic and lytic lesions within the clavicles, bilateral ribs, shoulders which was concerning for diffuse few skeletal metastatic disease.    This prompted a CT scan of the chest with contrast  which showed bilateral axillary and mediastinal adenopathy, borderline hilar lymph nodes bilaterally, findings likely flecked metastatic lymphadenopathy, moderate bilateral pleural effusions, extensive interstitial thickening and reticulonodular opacities throughout the lungs, cannot exclude lymphangitic spread of tumor, diffuse skeletal metastases.  The patient's subsequent  history is as detailed below.   PAST MEDICAL HISTORY: Past Medical History:  Diagnosis Date  . Cancer ([place])   . Family history of breast cancer   . Family history of colon cancer   . Family history of melanoma   . Family history of prostate cancer   . Gallstones   . Glaucoma   . Mastitis    reports history of recurrent mastitis  . Metastatic breast cancer ([place])   . Obesity     PAST SURGICAL HISTORY: Past Surgical History:  Procedure Laterality Date  . GLAUCOMA SURGERY  late 1990's  . IR THORACENTESIS ASP PLEURAL SPACE W/IMG GUIDE  10/28/2018  . IR THORACENTESIS ASP PLEURAL SPACE W/IMG GUIDE  10/29/2018  . OPEN REDUCTION INTERNAL FIXATION (ORIF) DISTAL RADIAL FRACTURE Left 01/09/2019   Procedure: OPEN REDUCTION INTERNAL FIXATION (ORIF) HUMERAL FRACTURE;  Surgeon: Netta Cedars, MD;  Location: [facility];  Service: Orthopedics;  Laterality: Left;    FAMILY HISTORY: Family History  Problem Relation Age of Onset  . Breast cancer Sister        in her 65's-70s  . Heart disease Brother        CABG  . Heart disease Brother        CABG  . Skin cancer Brother        melanoma  . Colon cancer Cousin   . Heart attack Mother   . Heart attack Father   . Prostate cancer Brother   Patient's father passed away at age 29, and her mother at 86, both from heart attacks. The patient has 8 siblings, 6 brothers and 1 sister.  Her sister was diagnosed with breast cancer. One brother has prostate cancer, another brother had a melanoma removed.    GYNECOLOGIC HISTORY:  Menarche: 60 years old Age at first live birth: 60 years old [place] P 1 LMP age 25 Contraceptive: used for 2-3 years with no problems HRT never used  Hysterectomy? no   SOCIAL HISTORY:  Suzanne [name] worked for an Engineer, manufacturing systems. She is now on disability. She is widowed. She lives at home with her daughter.  [name], 37, who is a Psychologist, occupational at [employer] center.  The patient has no grandchildren. She is not a Scientist, research (life sciences).   ADVANCED DIRECTIVES: not on file; she has the paperwork already. She intends to name her daughter as her HCPOA.   HEALTH MAINTENANCE: Social History   Tobacco Use  . Smoking status: Never Smoker  . Smokeless tobacco: Never Used  . Tobacco comment: second hand smoke exposure  Vaping Use  . Vaping Use: Never used  Substance Use Topics  . Alcohol use: No  . Drug use: No    Colonoscopy:   PAP:   Bone density:  Mammography:   No Known Allergies  Current Outpatient Medications  Medication Sig Dispense Refill  . albuterol (VENTOLIN HFA) 108 (90 Base) MCG/ACT inhaler Inhale 2 puffs into the lungs every 6 (six) hours as needed for wheezing or shortness of breath. 16 g 3  . anastrozole (ARIMIDEX) 1 MG tablet Take 1 tablet (1 mg total) by mouth daily. 30 tablet 3  . antiseptic oral rinse (BIOTENE) LIQD 15 mLs by Mouth Rinse route as needed for dry mouth.    Marland Kitchen  benzonatate (TESSALON) 200 MG capsule TAKE 1 CAPSULE BY MOUTH 3 TIMES DAILY IF NEEDED 30 capsule 0  . Blood Pressure Monitor MISC Use to check blood pressure twice per day and more often if not feeling well 1 each 0  . calcium-vitamin D (OSCAL WITH D) 500-200 MG-UNIT tablet Take 1 tablet by mouth 3 (three) times daily. 90 tablet 1  . carvedilol (COREG) 3.125 MG tablet Take 3.125 mg by mouth 2 (two) times daily with a meal. As directed    . famotidine (PEPCID) 20 MG tablet Take 1 tablet (20 mg total) by mouth 2 (two) times daily as needed for heartburn or indigestion. 30 tablet 3  . fluconazole (DIFLUCAN) 100 MG tablet Take 1 tablet (100 mg total) by mouth daily. Take medication for 10 days and then take only as needed. 90 tablet 3  . furosemide (LASIX) 20 MG tablet Take 1 tablet (20 mg total) by mouth daily. 90 tablet 4  . ketoconazole (NIZORAL) 2 % cream Apply to affected areas daily as needed 30 g 6  . loratadine (CLARITIN) 10 MG tablet Take 1 tablet (10 mg total) by mouth daily. 30 tablet 3  . nystatin  (MYCOSTATIN/NYSTOP) powder Apply 1 application topically as needed.    . palbociclib (IBRANCE) 100 MG tablet TAKE 1 TABLET (100 MG TOTAL) BY MOUTH DAILY. TAKE FOR 21 DAYS ON, 7 DAYS OFF, REPEAT EVERY 28 DAYS. 21 tablet 8  . polyethylene glycol (MIRALAX / GLYCOLAX) 17 g packet Take 17 g by mouth as needed for constipation.    . sertraline (ZOLOFT) 50 MG tablet Take 1.5 tablets (75 mg total) by mouth at bedtime. 30 tablet 2  . ZOFRAN 8 MG tablet Take 8 mg by mouth every 8 (eight) hours.     No current facility-administered medications for this visit.    OBJECTIVE: White woman examined in a wheelchair  Vitals:   07/07/20 1218  BP: (!) 152/77  Pulse: 93  Resp: 18  Temp: 98 F (36.7 C)  SpO2: 99%     Body mass index is 40.86 kg/m.   Wt Readings from Last 3 Encounters:  07/07/20 202 lb 4.8 oz (91.8 kg)  05/17/20 202 lb 4.8 oz (91.8 kg)  04/07/20 200 lb (90.7 kg)  ECOG FS: 2  Sclerae unicteric, EOMs intact Wearing a mask Lungs no rales or rhonchi Heart regular rate and rhythm Abd soft, nontender, positive bowel sounds Neuro: nonfocal, well oriented, appropriate affect Breasts: Deferred   LAB RESULTS:  CMP     Component Value Date/Time   NA 140 07/07/2020 1122   K 3.8 07/07/2020 1122   CL 104 07/07/2020 1122   CO2 28 07/07/2020 1122   GLUCOSE 108 (H) 07/07/2020 1122   BUN 16 07/07/2020 1122   CREATININE 0.83 07/07/2020 1122   CREATININE 0.93 04/07/2020 1216   CALCIUM 9.3 07/07/2020 1122   PROT 6.9 07/07/2020 1122   ALBUMIN 3.5 07/07/2020 1122   AST 21 07/07/2020 1122   AST 16 04/07/2020 1216   ALT 15 07/07/2020 1122   ALT 6 04/07/2020 1216   ALKPHOS 115 07/07/2020 1122   BILITOT 0.4 07/07/2020 1122   BILITOT 0.5 04/07/2020 1216   GFRNONAA >60 07/07/2020 1122   GFRNONAA >60 04/07/2020 1216   GFRAA >60 02/24/2020 1454   GFRAA >60 07/24/2019 1453    No results found for: TOTALPROTELP, ALBUMINELP, A1GS, A2GS, BETS, BETA2SER, GAMS, MSPIKE, SPEI  No results found  for: KPAFRELGTCHN, LAMBDASER, KAPLAMBRATIO  Lab Results  Component Value  Date   WBC 4.9 07/07/2020   NEUTROABS 3.1 07/07/2020   HGB 9.3 (L) 07/07/2020   HCT 30.0 (L) 07/07/2020   MCV 98.7 07/07/2020   PLT 367 07/07/2020   No results found for: LABCA2  No components found for: HENIDP824  No results for input(s): INR in the last 168 hours.  No results found for: LABCA2  No results found for: MPN361  No results found for: CAN125  No results found for: WER154  Lab Results  Component Value Date   CA2729 21.0 06/23/2020    No components found for: HGQUANT  No results found for: CEA1 / No results found for: CEA1   No results found for: AFPTUMOR  No results found for: CHROMOGRNA  No results found for: HGBA, HGBA2QUANT, HGBFQUANT, HGBSQUAN (Hemoglobinopathy evaluation)   Lab Results  Component Value Date   LDH 215 (H) 07/07/2020    Lab Results  Component Value Date   IRON 40 (L) 07/07/2020   TIBC 448 (H) 07/07/2020   IRONPCTSAT 9 (L) 07/07/2020   (Iron and TIBC)  Lab Results  Component Value Date   FERRITIN 21 07/07/2020    Urinalysis    Component Value Date/Time   COLORURINE YELLOW 04/07/2020 [place] 04/07/2020 1216   LABSPEC 1.026 04/07/2020 1216   PHURINE 5.0 04/07/2020 1216   GLUCOSEU NEGATIVE 04/07/2020 1216   HGBUR NEGATIVE 04/07/2020 1216   BILIRUBINUR NEGATIVE 04/07/2020 [place] 04/07/2020 1216   PROTEINUR NEGATIVE 04/07/2020 1216   NITRITE NEGATIVE 04/07/2020 1216   LEUKOCYTESUR SMALL (A) 04/07/2020 1216    STUDIES:  No results found.   ELIGIBLE FOR AVAILABLE RESEARCH PROTOCOL: no   ASSESSMENT: 60 y.o. [place] woman presenting 10/26/2018 with right-sided inflammatory breast cancer, stage IV, involvnh lungs, lymph nodes and bones, as follows:  (a) chest CT scan 10/26/2018 shows bilateral pleural effusions, possible lymphangitic spread of tumor, diffuse bony metastatic disease, and  significant axillary mediastinal and hilar adenopathy  (b) bone scan 10/27/2018 is a "near [place]" consistent with widespread bony metastatic disease  (c) head CT with and without contrast 10/30/2018 shows no intracranial metastatic disease, multiple calvarial lesions  (d) CA-27-29 on 10/27/2018 was 1810.4  (1) pleural fluid from right thoracentesis 10/28/2018 confirms malignant cells consistent with a breast primary, strongly estrogen and progesterone receptor positive, HER-2 not amplified, with an MIB-1 of 2%  (2) anastrozole started 10/29/2018  (a) palbociclib to start 11/13/2018 at 100 mg dose  (b) denosumab/xgeva started 11/13/2018   (3) associated problems:  (a) hypoxia secondary to effusions  (b) pain from bone lesions and left humeral and vertebral compression fractures  (c) right upper extremity lymphedema  (d) anemia, requiring transfusion  (e) poor venous access  (4) genetics testing on 12/25/2018 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 STK11, TP53, TSC1, TSC2, and VHL.  The following genes were evaluated for sequence changes only: SDHA and HOXB13 c.251G>A variant only. The [facility] Melanoma Panel analyzed the following 9 genes: BAP1 BRCA2 CDK4 CDKN2A MITF POT1 PTEN RB1 Tp53.   (5) palliative radiation  to the left humerus 07/20/2019 - 07/31/2019 Site Technique Total Dose (Gy) Dose per Fx (Gy) Completed Fx Beam Energies  Humerus, Left: Ext_Lt_humerus Complex 30/30 3 10/10  6X   (6) anemia requiring transfusion: Work-up in progress   PLAN: Suzanne [name] will soon be 2 years out from definitive diagnosis of breast cancer.  Her disease is clinically well controlled and she is not aware of any symptoms directly related to her cancer.  She generally tolerates her treatment well also.  We had surprise last time with a very severe anemia.  We obtained additional lab work today to try to work that up.  In the meantime I have asked her to take folate, B12, and iron supplementation just in case.  She has not been able to get this delivered from her pharmacy but since she gets all her groceries delivered she could put it on the grocery list and the pharmacy at the grocery store should be able to supply her with that.  I wrote it out for her so she would be able to understand it.  I do note that her reticulocyte count now is considerably better than prior.  We are going to resume the Ibrance beginning 07/11/2020, but at 75 mg instead of 100.  I have put in that order for her today  Otherwise she will return in March and April for her Xgeva doses and she will see me with the April dose  She knows to call for any other issue admittable before the next visit  Total encounter time 30 minutes.   Virgie Dad. Suzanne Wittke, MD 07/07/20 12:45 PM Medical Oncology and Hematology [facility] [place], [place] [address] Tel. [phone]    Fax. [phone]   I, Wilburn Mylar, am acting as scribe for Dr. Virgie Dad. Suzanne [name].  I, Lurline Del MD, have reviewed the above documentation for accuracy and completeness, and I agree with the above.   *Total Encounter Time as defined by the Centers for Medicare and Medicaid Services includes, in addition to the face-to-face time of a patient visit (documented in the note above) non-face-to-face time: obtaining and reviewing outside history, ordering and reviewing medications, tests or procedures, care  coordination (communications with other health care professionals or caregivers) and documentation in the medical record.

## 2020-07-07 ENCOUNTER — Encounter: Payer: Self-pay | Admitting: Oncology

## 2020-07-07 ENCOUNTER — Inpatient Hospital Stay: Payer: Medicaid Other

## 2020-07-07 ENCOUNTER — Other Ambulatory Visit: Payer: Self-pay | Admitting: Oncology

## 2020-07-07 ENCOUNTER — Other Ambulatory Visit: Payer: Self-pay

## 2020-07-07 ENCOUNTER — Inpatient Hospital Stay: Payer: Medicaid Other | Attending: Adult Health | Admitting: Oncology

## 2020-07-07 VITALS — BP 152/77 | HR 93 | Temp 98.0°F | Resp 18 | Ht 59.0 in | Wt 202.3 lb

## 2020-07-07 DIAGNOSIS — C50911 Malignant neoplasm of unspecified site of right female breast: Secondary | ICD-10-CM | POA: Diagnosis not present

## 2020-07-07 DIAGNOSIS — C779 Secondary and unspecified malignant neoplasm of lymph node, unspecified: Secondary | ICD-10-CM | POA: Diagnosis not present

## 2020-07-07 DIAGNOSIS — C7951 Secondary malignant neoplasm of bone: Secondary | ICD-10-CM

## 2020-07-07 DIAGNOSIS — D649 Anemia, unspecified: Secondary | ICD-10-CM | POA: Insufficient documentation

## 2020-07-07 DIAGNOSIS — I890 Lymphedema, not elsewhere classified: Secondary | ICD-10-CM | POA: Insufficient documentation

## 2020-07-07 DIAGNOSIS — C7801 Secondary malignant neoplasm of right lung: Secondary | ICD-10-CM

## 2020-07-07 DIAGNOSIS — Z79811 Long term (current) use of aromatase inhibitors: Secondary | ICD-10-CM | POA: Insufficient documentation

## 2020-07-07 DIAGNOSIS — Z803 Family history of malignant neoplasm of breast: Secondary | ICD-10-CM | POA: Insufficient documentation

## 2020-07-07 DIAGNOSIS — Z79899 Other long term (current) drug therapy: Secondary | ICD-10-CM | POA: Diagnosis not present

## 2020-07-07 DIAGNOSIS — R21 Rash and other nonspecific skin eruption: Secondary | ICD-10-CM | POA: Insufficient documentation

## 2020-07-07 DIAGNOSIS — Z808 Family history of malignant neoplasm of other organs or systems: Secondary | ICD-10-CM | POA: Diagnosis not present

## 2020-07-07 DIAGNOSIS — C7800 Secondary malignant neoplasm of unspecified lung: Secondary | ICD-10-CM

## 2020-07-07 DIAGNOSIS — R0902 Hypoxemia: Secondary | ICD-10-CM | POA: Diagnosis not present

## 2020-07-07 DIAGNOSIS — Z170 Estrogen receptor positive status [ER+]: Secondary | ICD-10-CM

## 2020-07-07 DIAGNOSIS — G893 Neoplasm related pain (acute) (chronic): Secondary | ICD-10-CM

## 2020-07-07 DIAGNOSIS — D518 Other vitamin B12 deficiency anemias: Secondary | ICD-10-CM

## 2020-07-07 DIAGNOSIS — C50811 Malignant neoplasm of overlapping sites of right female breast: Secondary | ICD-10-CM

## 2020-07-07 DIAGNOSIS — J90 Pleural effusion, not elsewhere classified: Secondary | ICD-10-CM | POA: Insufficient documentation

## 2020-07-07 DIAGNOSIS — M4850XA Collapsed vertebra, not elsewhere classified, site unspecified, initial encounter for fracture: Secondary | ICD-10-CM | POA: Insufficient documentation

## 2020-07-07 DIAGNOSIS — Z923 Personal history of irradiation: Secondary | ICD-10-CM | POA: Diagnosis not present

## 2020-07-07 DIAGNOSIS — Z800 Family history of malignant neoplasm of digestive organs: Secondary | ICD-10-CM | POA: Insufficient documentation

## 2020-07-07 DIAGNOSIS — R0602 Shortness of breath: Secondary | ICD-10-CM

## 2020-07-07 LAB — IRON AND TIBC
Iron: 40 ug/dL — ABNORMAL LOW (ref 41–142)
Saturation Ratios: 9 % — ABNORMAL LOW (ref 21–57)
TIBC: 448 ug/dL — ABNORMAL HIGH (ref 236–444)
UIBC: 408 ug/dL — ABNORMAL HIGH (ref 120–384)

## 2020-07-07 LAB — CBC WITH DIFFERENTIAL/PLATELET
Abs Immature Granulocytes: 0.01 10*3/uL (ref 0.00–0.07)
Basophils Absolute: 0.1 10*3/uL (ref 0.0–0.1)
Basophils Relative: 3 %
Eosinophils Absolute: 0.1 10*3/uL (ref 0.0–0.5)
Eosinophils Relative: 3 %
HCT: 30 % — ABNORMAL LOW (ref 36.0–46.0)
Hemoglobin: 9.3 g/dL — ABNORMAL LOW (ref 12.0–15.0)
Immature Granulocytes: 0 %
Lymphocytes Relative: 21 %
Lymphs Abs: 1 10*3/uL (ref 0.7–4.0)
MCH: 30.6 pg (ref 26.0–34.0)
MCHC: 31 g/dL (ref 30.0–36.0)
MCV: 98.7 fL (ref 80.0–100.0)
Monocytes Absolute: 0.5 10*3/uL (ref 0.1–1.0)
Monocytes Relative: 10 %
Neutro Abs: 3.1 10*3/uL (ref 1.7–7.7)
Neutrophils Relative %: 63 %
Platelets: 367 10*3/uL (ref 150–400)
RBC: 3.04 MIL/uL — ABNORMAL LOW (ref 3.87–5.11)
RDW: 19.2 % — ABNORMAL HIGH (ref 11.5–15.5)
WBC: 4.9 10*3/uL (ref 4.0–10.5)
nRBC: 0 % (ref 0.0–0.2)

## 2020-07-07 LAB — RETICULOCYTES
Immature Retic Fract: 27.2 % — ABNORMAL HIGH (ref 2.3–15.9)
RBC.: 2.9 MIL/uL — ABNORMAL LOW (ref 3.87–5.11)
Retic Count, Absolute: 178.5 10*3/uL (ref 19.0–186.0)
Retic Ct Pct: 6.2 % — ABNORMAL HIGH (ref 0.4–3.1)

## 2020-07-07 LAB — COMPREHENSIVE METABOLIC PANEL
ALT: 15 U/L (ref 0–44)
AST: 21 U/L (ref 15–41)
Albumin: 3.5 g/dL (ref 3.5–5.0)
Alkaline Phosphatase: 115 U/L (ref 38–126)
Anion gap: 8 (ref 5–15)
BUN: 16 mg/dL (ref 8–23)
CO2: 28 mmol/L (ref 22–32)
Calcium: 9.3 mg/dL (ref 8.9–10.3)
Chloride: 104 mmol/L (ref 98–111)
Creatinine, Ser: 0.83 mg/dL (ref 0.44–1.00)
GFR, Estimated: >60 mL/min (ref >60)
Glucose, Bld: 108 mg/dL — ABNORMAL HIGH (ref 70–99)
Potassium: 3.8 mmol/L (ref 3.5–5.1)
Sodium: 140 mmol/L (ref 135–145)
Total Bilirubin: 0.4 mg/dL (ref 0.3–1.2)
Total Protein: 6.9 g/dL (ref 6.5–8.1)

## 2020-07-07 LAB — FERRITIN: Ferritin: 21 ng/mL (ref 11–307)

## 2020-07-07 LAB — FOLATE: Folate: 8.7 ng/mL (ref >5.9)

## 2020-07-07 LAB — DIRECT ANTIGLOBULIN TEST (NOT AT ARMC)
DAT, IgG: NEGATIVE
DAT, complement: NEGATIVE

## 2020-07-07 LAB — VITAMIN B12: Vitamin B-12: 134 pg/mL — ABNORMAL LOW (ref 180–914)

## 2020-07-07 LAB — LACTATE DEHYDROGENASE: LDH: 215 U/L — ABNORMAL HIGH (ref 98–192)

## 2020-07-07 MED ORDER — PALBOCICLIB 75 MG PO TABS: 75.0000 mg | ORAL_TABLET | Freq: Every day | ORAL | 6 refills | Status: DC

## 2020-07-08 ENCOUNTER — Ambulatory Visit: Payer: Medicaid Other | Attending: Nurse Practitioner | Admitting: Nurse Practitioner

## 2020-07-08 ENCOUNTER — Telehealth: Payer: Self-pay | Admitting: Oncology

## 2020-07-08 ENCOUNTER — Encounter: Payer: Self-pay | Admitting: Nurse Practitioner

## 2020-07-08 DIAGNOSIS — F329 Major depressive disorder, single episode, unspecified: Secondary | ICD-10-CM | POA: Diagnosis not present

## 2020-07-08 DIAGNOSIS — J302 Other seasonal allergic rhinitis: Secondary | ICD-10-CM

## 2020-07-08 DIAGNOSIS — K089 Disorder of teeth and supporting structures, unspecified: Secondary | ICD-10-CM | POA: Diagnosis not present

## 2020-07-08 DIAGNOSIS — Z7689 Persons encountering health services in other specified circumstances: Secondary | ICD-10-CM

## 2020-07-08 NOTE — Progress Notes (Signed)
Establish care  Zoloft refilled by Oncology. Patient needs to be established with new PCP to get refills in the future.

## 2020-07-08 NOTE — Progress Notes (Signed)
Virtual Visit via Telephone Note Due to national recommendations of social distancing due to [place] 19, telehealth visit is felt to be most appropriate for this patient at this time.  I discussed the limitations, risks, security and privacy concerns of performing an evaluation and management service by telephone and the availability of in person appointments. I also discussed with the patient that there may be a patient responsible charge related to this service. The patient expressed understanding and agreed to proceed.    I connected with Suzanne [name] on 07/09/20  at   3:30 PM EST  EDT by telephone and verified that I am speaking with the correct person using two identifiers.   Consent I discussed the limitations, risks, security and privacy concerns of performing an evaluation and management service by telephone and the availability of in person appointments. I also discussed with the patient that there may be a patient responsible charge related to this service. The patient expressed understanding and agreed to proceed.   Location of Patient: Private Residence   Location of Provider: [facility] [employer] Office    Persons participating in Telemedicine visit: Geryl Rankins FNP-BC [place]    History of Present Illness: Telemedicine visit for: Establish Care She has a past medical history of malignant neoplasm of right breast (stage IV estrogen receptor positive) with metastatic lung cancer and bone mets, Gallstones, Glaucoma, Mastitis,  Anemia, Positive FOBT, and Obesity.  She is currently being followed by Oncology and taking anastrozole, palbociclib and Xgeva  Requesting refill of zoloft today. Having issues getting the correct amount of tablets for 75 mg 90 day supply. Hopefully this will be corrected with new script sent today.   Would like to try new allergy medication. Does not feel loratadine is effective any longer.   Requesting dental  referral for cleaning and a tooth that needs to be evaluated.   Denies chest pain, shortness of breath, palpitations, lightheadedness, dizziness, headaches or BLE edema.    Past Medical History:  Diagnosis Date  . Cancer ([place])   . Family history of breast cancer   . Family history of colon cancer   . Family history of melanoma   . Family history of prostate cancer   . Gallstones   . Glaucoma   . Mastitis    reports history of recurrent mastitis  . Metastatic breast cancer ([place])   . Obesity     Past Surgical History:  Procedure Laterality Date  . GLAUCOMA SURGERY  late 1990's  . IR THORACENTESIS ASP PLEURAL SPACE W/IMG GUIDE  10/28/2018  . IR THORACENTESIS ASP PLEURAL SPACE W/IMG GUIDE  10/29/2018  . OPEN REDUCTION INTERNAL FIXATION (ORIF) DISTAL RADIAL FRACTURE Left 01/09/2019   Procedure: OPEN REDUCTION INTERNAL FIXATION (ORIF) HUMERAL FRACTURE;  Surgeon: Netta Cedars, MD;  Location: [facility];  Service: Orthopedics;  Laterality: Left;    Family History  Problem Relation Age of Onset  . Breast cancer Sister        in her 20's-70s  . Heart disease Brother        CABG  . Heart disease Brother        CABG  . Skin cancer Brother        melanoma  . Colon cancer Cousin   . Heart attack Mother   . Heart attack Father   . Prostate cancer Brother     Social History   Socioeconomic History  . Marital status: Widowed    Spouse  name: Not on file  . Number of children: 1  . Years of education: Not on file  . Highest education level: Not on file  Occupational History  . Occupation: Doctor, general practice    Comment: Self-employed  Tobacco Use  . Smoking status: Never Smoker  . Smokeless tobacco: Never Used  . Tobacco comment: second hand smoke exposure  Vaping Use  . Vaping Use: Never used  Substance and Sexual Activity  . Alcohol use: No  . Drug use: No  . Sexual activity: Not Currently  Other Topics Concern  . Not on file  Social History Narrative   Has a daughter  named Suzanne [name]. Daughter has CP.   Social Determinants of Health   Financial Resource Strain: Not on file  Food Insecurity: Not on file  Transportation Needs: Not on file  Physical Activity: Not on file  Stress: Not on file  Social Connections: Not on file     Observations/Objective: Awake, alert and oriented x 3   Review of Systems  Constitutional: Negative for fever, malaise/fatigue and weight loss.  HENT: Negative.  Negative for nosebleeds.   Eyes: Negative.  Negative for blurred vision, double vision and photophobia.  Respiratory: Negative.  Negative for cough and shortness of breath.   Cardiovascular: Negative.  Negative for chest pain, palpitations and leg swelling.  Gastrointestinal: Negative.  Negative for heartburn, nausea and vomiting.  Musculoskeletal: Negative.  Negative for myalgias.  Neurological: Negative.  Negative for dizziness, focal weakness, seizures and headaches.  Endo/Heme/Allergies: Positive for environmental allergies.  Psychiatric/Behavioral: Negative.  Negative for suicidal ideas.    Assessment and Plan: [name] was seen today for establish care.  Diagnoses and all orders for this visit:  Encounter to establish care  Poor dentition -     Ambulatory referral to Dentistry  Reactive depression -     sertraline (ZOLOFT) 50 MG tablet; Take 1.5 tablets (75 mg total) by mouth at bedtime. 90 day supply is 135 tablets  Seasonal allergies -     cetirizine (ZYRTEC) 10 MG tablet; Take 1 tablet (10 mg total) by mouth daily.     Follow Up Instructions No follow-ups on file.     I discussed the assessment and treatment plan with the patient. The patient was provided an opportunity to ask questions and all were answered. The patient agreed with the plan and demonstrated an understanding of the instructions.   The patient was advised to call back or seek an in-person evaluation if the symptoms worsen or if the condition fails to improve as  anticipated.  I provided 14 minutes of non-face-to-face time during this encounter including median intraservice time, reviewing previous notes, labs, imaging, medications and explaining diagnosis and management.  [name], FNP-BC

## 2020-07-08 NOTE — Telephone Encounter (Signed)
No 2/10 los. No changes made to pt's schedule.

## 2020-07-09 ENCOUNTER — Encounter: Payer: Self-pay | Admitting: Nurse Practitioner

## 2020-07-09 MED ORDER — CETIRIZINE HCL 10 MG PO TABS: 10.0000 mg | ORAL_TABLET | Freq: Every day | ORAL | 3 refills | Status: DC

## 2020-07-09 MED ORDER — SERTRALINE HCL 50 MG PO TABS
75.0000 mg | ORAL_TABLET | Freq: Every day | ORAL | 0 refills | Status: DC
Start: 2020-07-09 — End: 2020-07-19

## 2020-07-11 ENCOUNTER — Telehealth: Payer: Self-pay

## 2020-07-11 DIAGNOSIS — C50811 Malignant neoplasm of overlapping sites of right female breast: Secondary | ICD-10-CM | POA: Diagnosis not present

## 2020-07-11 NOTE — Telephone Encounter (Signed)
Returned call to pt: Clarified that pt's next appointment needs to be on 07/25/20 and not before. Verified that pt's Ibrance prescription is at [facility]. Pt verbalized understanding.

## 2020-07-12 ENCOUNTER — Other Ambulatory Visit: Payer: Medicaid Other

## 2020-07-12 ENCOUNTER — Ambulatory Visit: Payer: Medicaid Other | Admitting: Oncology

## 2020-07-12 ENCOUNTER — Ambulatory Visit: Payer: Medicaid Other

## 2020-07-12 DIAGNOSIS — C50811 Malignant neoplasm of overlapping sites of right female breast: Secondary | ICD-10-CM | POA: Diagnosis not present

## 2020-07-12 MED FILL — IBRANCE 75 MG TABS: 75 | 28 days supply | Qty: 21 | Fill #0

## 2020-07-13 DIAGNOSIS — C50811 Malignant neoplasm of overlapping sites of right female breast: Secondary | ICD-10-CM | POA: Diagnosis not present

## 2020-07-14 ENCOUNTER — Other Ambulatory Visit: Payer: Self-pay | Admitting: *Deleted

## 2020-07-14 ENCOUNTER — Other Ambulatory Visit: Payer: Self-pay

## 2020-07-14 DIAGNOSIS — C50811 Malignant neoplasm of overlapping sites of right female breast: Secondary | ICD-10-CM | POA: Diagnosis not present

## 2020-07-14 NOTE — Patient Instructions (Addendum)
Visit Information  Suzanne [name] was given information about Medicaid Managed Care team care coordination services as a part of their [place] Medicaid benefit. Suzanne [name] verbally consented to engagement with the [facility] Managed Care team.   For questions related to your [facility], please call: [phone] or visit the homepage here: [email or web address]  If you would like to schedule transportation through your [facility], please call the following number at least 2 days in advance of your appointment: [phone]  Suzanne [name] - following are the goals we discussed in your visit today:  Goals Addressed            This Visit's Progress   . Find Help in My Community       Timeframe:  Long-Range Goal Priority:  High Start Date:  07/14/20                           Expected End Date:  09/19/20                   Follow Up Date 08/17/20   - begin a notebook of services in my neighborhood or community - call 211 when I need some help - follow-up on any referrals for help I am given - have a back-up plan - make a list of family or friends that I can call    Why is this important?    Knowing how and where to find help for yourself or family in your neighborhood and community is an important skill.   You will want to take some steps to learn how.        . Make and Keep All Appointments       Timeframe:  Long-Range Goal Priority:  High Start Date:   07/14/20                          Expected End Date:   09/19/20                    Follow Up Date 08/17/20    - call [phone] for medical transportation provided by [facility] - call to cancel if needed - keep a calendar with prescription refill dates - keep a calendar with appointment dates    Why is this important?    Part of staying healthy is seeing the doctor for follow-up care.   If you  forget your appointments, there are some things you can do to stay on track.           Please see education materials related to anemia provided by MyChart link.    Iron-Rich Diet  Iron is a mineral that helps your body to produce hemoglobin. Hemoglobin is a protein in red blood cells that carries oxygen to your body's tissues. Eating too little iron may cause you to feel weak and tired, and it can increase your risk of infection. Iron is naturally found in many foods, and many foods have iron added to them (iron-fortified foods). You may need to follow an iron-rich diet if you do not have enough iron in your body due to certain medical conditions. The amount of iron that you need each day depends on your age, your sex, and any medical conditions you have. Follow instructions from your health care provider or a diet and nutrition specialist (dietitian) about how  much iron you should eat each day. What are tips for following this plan? Reading food labels  Check food labels to see how many milligrams (mg) of iron are in each serving. Cooking  Cook foods in pots and pans that are made from iron.  Take these steps to make it easier for your body to absorb iron from certain foods: ? Soak beans overnight before cooking. ? Soak whole grains overnight and drain them before using. ? Ferment flours before baking, such as by using yeast in bread dough. Meal planning  When you eat foods that contain iron, you should eat them with foods that are high in vitamin C. These include oranges, peppers, tomatoes, potatoes, and mango. Vitamin C helps your body to absorb iron. General information  Take iron supplements only as told by your health care provider. An overdose of iron can be life-threatening. If you were prescribed iron supplements, take them with orange juice or a vitamin C supplement.  When you eat iron-fortified foods or take an iron supplement, you should also eat foods that naturally  contain iron, such as meat, poultry, and fish. Eating naturally iron-rich foods helps your body to absorb the iron that is added to other foods or contained in a supplement.  Certain foods and drinks prevent your body from absorbing iron properly. Avoid eating these foods in the same meal as iron-rich foods or with iron supplements. These foods include: ? Coffee, black tea, and red wine. ? Milk, dairy products, and foods that are high in calcium. ? Beans and soybeans. ? Whole grains. What foods should I eat? Fruits Prunes. Raisins. Eat fruits high in vitamin C, such as oranges, grapefruits, and strawberries, alongside iron-rich foods. Vegetables Spinach (cooked). Green peas. Broccoli. Fermented vegetables. Eat vegetables high in vitamin C, such as leafy greens, potatoes, bell peppers, and tomatoes, alongside iron-rich foods. Grains Iron-fortified breakfast cereal. Iron-fortified whole-wheat bread. Enriched rice. Sprouted grains. Meats and other proteins Beef liver. Oysters. Beef. Shrimp. [place]. Chicken. [place]. Sardines. Chickpeas. Nuts. Tofu. Pumpkin seeds. Beverages Tomato juice. Fresh orange juice. Prune juice. Hibiscus tea. Fortified instant breakfast shakes. Sweets and desserts Blackstrap molasses. Seasonings and condiments Tahini. Fermented soy sauce. Other foods Wheat germ. The items listed above may not be a complete list of recommended foods and beverages. Contact a dietitian for more information. What foods should I avoid? Grains Whole grains. Bran cereal. Bran flour. Oats. Meats and other proteins Soybeans. Products made from soy protein. Black beans. Lentils. Mung beans. Split peas. Dairy Milk. Cream. Cheese. Yogurt. Cottage cheese. Beverages Coffee. Black tea. Red wine. Sweets and desserts Cocoa. Chocolate. Ice cream. Other foods Basil. Oregano. Large amounts of parsley. The items listed above may not be a complete list of foods and beverages to avoid. Contact a  dietitian for more information. Summary  Iron is a mineral that helps your body to produce hemoglobin. Hemoglobin is a protein in red blood cells that carries oxygen to your body's tissues.  Iron is naturally found in many foods, and many foods have iron added to them (iron-fortified foods).  When you eat foods that contain iron, you should eat them with foods that are high in vitamin C. Vitamin C helps your body to absorb iron.  Certain foods and drinks prevent your body from absorbing iron properly, such as whole grains and dairy products. You should avoid eating these foods in the same meal as iron-rich foods or with iron supplements. This information is not intended to replace advice given  to you by your health care provider. Make sure you discuss any questions you have with your health care provider. Document Revised: 04/26/2017 Document Reviewed: 04/09/2017 Elsevier Patient Education  2021 [place].   Patient verbalizes understanding of instructions provided today.   Telephone follow up appointment with Managed Medicaid care management team member scheduled for:08/17/20 @ 2:30pm  Suzanne Montane, RN  Following is a copy of your plan of care:  Patient Care Plan: General Plan of Care (Adult)    Problem Identified: Health Promotion or Disease Self-Management (General Plan of Care)     Long-Range Goal: Self-Management Plan Developed   Start Date: 07/14/2020  Expected End Date: 09/19/2020  This Visit's Progress: On track  Priority: High  Note:   Current Barriers:  . Care Coordination needs related to managing breast cancer treatment. Suzanne [name] is being treated for stage IV breast cancer. She and her daughter live together. Suzanne [name] is unable to drive and her daughter does not drive. This makes it difficult to get groceries and OTC medications. They have had groceries delivered, which is very expensive after paying the extra fees. Suzanne [name] is able to walk around in the house,  but requires a wheelchair when going to the [place] for appointments. Her wheelchair is missing a screw on the leg support and the brake is not working correctly. They have called [employer] for assistance with fixing the wheelchair and were told they would need to bring the wheelchair to the office, which is in [facility] . Film/video editor.  . Transportation barriers . Difficulty obtaining medications . Food Insecurities  Nurse Case Manager Clinical Goal(s):  Marland Kitchen Over the next 30 days, patient will work with [place] to address needs related to procuring groceries and assistance applying for food benefits . Over the next 30 days, patient will meet with RN Care Manager to address barriers to managing her healthcare at home . Over the next 30 days, patient will work with CM team pharmacist for medication management  Interventions:  . Inter-disciplinary care team collaboration (see longitudinal plan of care) . Evaluation of current treatment plan related to anemia and patient's adherence to plan as established by provider. . Reviewed medications with patient and discussed difficulty obtaining OTC medications . Discussed plans with patient for ongoing care management follow up and provided patient with direct contact information for care management team . Provided patient with MyChart educational materials related to anemia . Care Guide referral for food insecurities . Pharmacy referral for medication management . Follow up with [employer] for repair of wheelchair-Call to [facility], Branch manager should be calling Suzanne [name] with a plan to swap out her wheelchair or wheelchair repair.   Patient Goals/Self-Care Activities Over the next 30 days, patient will: - begin a notebook of services in my neighborhood or community - call 211 when I need some help - follow-up on any referrals for help I am given - have a back-up plan - make a list of family or friends that I can call  - call  [phone] for medical transportation provided by [facility] - call to cancel if needed - keep a calendar with prescription refill dates - keep a calendar with appointment dates   Follow Up Plan: Telephone follow up appointment with Managed Medicaid care management team member scheduled for:08/17/20 @ 2:30pm

## 2020-07-14 NOTE — Patient Outreach (Addendum)
Medicaid Managed Care   Nurse Care Manager Note  07/14/2020 Name:  Suzanne [name] MRN:  [id number] DOB:  [birth year]  Suzanne [name] is an 60 y.o. year old female who is a primary patient of Fulp, Cammie, MD (Inactive).  The [facility] Managed Care Coordination team was consulted for assistance with:    breast cancer  [name] was given information about Medicaid Managed Care Coordination team services today. [name] agreed to services and verbal consent obtained.  Engaged with patient by telephone for follow up visit in response to provider referral for case management and/or care coordination services.   Assessments/Interventions:  Review of past medical history, allergies, medications, health status, including review of consultants reports, laboratory and other test data, was performed as part of comprehensive evaluation and provision of chronic care management services.  SDOH (Social Determinants of Health) assessments and interventions performed:   Care Plan  No Known Allergies  Medications Reviewed Today    Reviewed by Melissa Montane, RN (Registered Nurse) on 07/14/20 at 1514  Med List Status: <None>  Medication Order Taking? Sig Documenting Provider Last Dose Status Informant  anastrozole (ARIMIDEX) 1 MG tablet [id number] Yes Take 1 tablet (1 mg total) by mouth daily. [name], Virgie Dad, MD Taking Active   antiseptic oral rinse Charlene Brooke) LIQD [id number] No 15 mLs by Mouth Rinse route as needed for dry mouth.  Patient not taking: Reported on 07/14/2020   [provider] Not Taking Active Family Member  Blood Pressure Monitor [place] [id number] Yes Use to check blood pressure twice per day and more often if not feeling well Fulp, Cammie, MD Taking Active   calcium-vitamin D (OSCAL WITH D) 500-200 MG-UNIT tablet [id number] Yes Take 1 tablet by mouth 3 (three) times daily. Rai, Vernelle Emerald, MD Taking Active Family Member  cetirizine (ZYRTEC) 10 MG tablet [id number] No Take 1  tablet (10 mg total) by mouth daily.  Patient not taking: Reported on 07/14/2020   [name], NP Not Taking Active   famotidine (PEPCID) 20 MG tablet [id number] Yes Take 1 tablet (20 mg total) by mouth 2 (two) times daily as needed for heartburn or indigestion. [name], Virgie Dad, MD Taking Active   furosemide (LASIX) 20 MG tablet [id number] Yes Take 1 tablet (20 mg total) by mouth daily. [name], Virgie Dad, MD Taking Active   ketoconazole (NIZORAL) 2 % cream [id number] Yes Apply to affected areas daily as needed [name], Virgie Dad, MD Taking Active   MELATONIN PO [id number] Yes Take by mouth. [provider] Taking Active   methocarbamol (ROBAXIN) 500 MG tablet [id number]  Take 500 mg by mouth every 6 (six) hours as needed. [provider]  Active   nystatin (MYCOSTATIN/NYSTOP) powder [id number] Yes Apply 1 application topically as needed. [provider] Taking Active   palbociclib [name]) 75 MG tablet [id number] No Take 1 tablet (75 mg total) by mouth daily. TAKE 1 TABLET (100 MG TOTAL) BY MOUTH DAILY. TAKE FOR 21 DAYS ON, 7 DAYS OFF, REPEAT EVERY 28 DAYS.  Patient not taking: No sig reported   [name], Virgie Dad, MD Not Taking Active            Med Note [name]   Fri Jul 08, 2020  3:28 PM) Told to restart on Monday by Oncologist  polyethylene glycol (MIRALAX / GLYCOLAX) 17 g packet [id number] Yes Take 17 g by mouth as needed for constipation. [provider] Taking Active Family Member  sertraline (ZOLOFT) 50 MG  tablet [id number] Yes Take 1.5 tablets (75 mg total) by mouth at bedtime. 90 day supply is 135 tablets [name], NP Taking Active   ZOFRAN 8 MG tablet [id number] Yes Take 8 mg by mouth every 8 (eight) hours. [provider] Taking Active   Med List Note [name], RPH-CPP 11/13/19 3154): [name] filled at [place]          Patient Active Problem List   Diagnosis Date Noted  . Intractable back  pain 02/03/2019  . Thoracic compression fracture, closed, initial encounter ([place]) 02/03/2019  . Essential hypertension 02/03/2019  . Genetic testing 01/27/2019  . Acute pharyngitis   . Bacteremia   . FTT (failure to thrive) in adult   . Fracture closed, humerus, shaft 01/09/2019  . Pneumonia 12/25/2018  . Acute respiratory disorder in immunocompromised patient ([place]) 12/25/2018  . Family history of breast cancer   . Family history of prostate cancer   . Family history of melanoma   . Family history of colon cancer   . Pressure injury of skin 11/24/2018  . Chronic respiratory failure with hypoxia and hypercapnia ([place]) 11/19/2018  . 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 Hypophosphatemia 11/19/2018  . Hypocalcemia 11/19/2018  . Aspiration pneumonia ([place]) 11/19/2018  . SOB (shortness of breath)   . Palliative care by specialist   . Carcinoma of breast, estrogen receptor positive, stage 4, right ([place]) 11/16/2018  . Hypoalbuminemia 11/16/2018  . Pleural effusion 11/14/2018  . Goals of care, counseling/discussion 11/06/2018  . Malignant neoplasm of overlapping sites of right breast in female, estrogen receptor positive ([place]) 10/31/2018  . Lung metastasis ([place]) 10/31/2018  . Pain from bone metastases ([place]) 10/31/2018  . Morbid obesity with BMI of 40.0-44.9, adult ([place]) 10/31/2018  . Bone injury   . Pleural effusion, bilateral   . Shortness of breath   . Abnormal breast finding   . Hypokalemia   . Breast skin changes   . Macrocytic anemia   . Bone metastasis ([place])   . Anemia, B12 deficiency 10/26/2018    Conditions to be addressed/monitored per PCP order:  breast cancer management  Care Plan : General Plan of Care (Adult)  Updates made by Melissa Montane, RN since 07/14/2020 12:00 AM    Problem: Health Promotion or Disease Self-Management (General Plan of Care)     Long-Range Goal: Self-Management Plan Developed   Start Date: 07/14/2020  Expected End Date: 09/19/2020  This  Visit's Progress: On track  Priority: High  Note:   Current Barriers:  . Care Coordination needs related to managing breast cancer treatment. Ms. Rabinovich is being treated for stage IV breast cancer. She and her daughter live together. Ms. Klich is unable to drive and her daughter does not drive. This makes it difficult to get groceries and OTC medications. They have had groceries delivered, which is very expensive after paying the extra fees. Ms Boden is able to walk around in the house, but requires a wheelchair when going to the [place] for appointments. Her wheelchair is missing a screw on the leg support and the brake is not working correctly. They have called [employer] for assistance with fixing the wheelchair and were told they would need to bring the wheelchair to the office, which is in [facility]. . Film/video editor.  . Transportation barriers . Difficulty obtaining medications . Food Insecurities  Nurse Case Manager Clinical Goal(s):  Marland Kitchen Over the next 30 days, patient will work with Care Guide to address  needs related to procuring groceries and assistance applying for food benefits . Over the next 30 days, patient will meet with RN Care Manager to address barriers to managing her healthcare at home . Over the next 30 days, patient will work with CM team pharmacist for medication management  Interventions:  . Inter-disciplinary care team collaboration (see longitudinal plan of care) . Evaluation of current treatment plan related to anemia and patient's adherence to plan as established by provider. . Reviewed medications with patient and discussed difficulty obtaining OTC medications . Discussed plans with patient for ongoing care management follow up and provided patient with direct contact information for care management team . Provided patient with MyChart educational materials related to anemia . Care Guide referral for food insecurities . Pharmacy referral for medication  management . Follow up with [employer] for repair of wheelchair-Call to [facility], Branch manager should be calling Ms. Gentzler with a plan to swap out her wheelchair or wheelchair repair.   Patient Goals/Self-Care Activities Over the next 30 days, patient will: - begin a notebook of services in my neighborhood or community - call 211 when I need some help - follow-up on any referrals for help I am given - have a back-up plan - make a list of family or friends that I can call  - call [phone] for medical transportation provided by [facility] - call to cancel if needed - keep a calendar with prescription refill dates - keep a calendar with appointment dates   Follow Up Plan: Telephone follow up appointment with Managed Medicaid care management team member scheduled for:08/17/20 @ 2:30pm        Follow Up:  Patient agrees to Care Plan and Follow-up.  Plan: The Managed Medicaid care management team will reach out to the patient again over the next 30 days.  Date/time of next scheduled RN care management/care coordination outreach: 08/17/20 @ 2:30pm  [name] RN, [place] RN Care Coordinator

## 2020-07-15 ENCOUNTER — Other Ambulatory Visit: Payer: Medicaid Other

## 2020-07-15 DIAGNOSIS — C50811 Malignant neoplasm of overlapping sites of right female breast: Secondary | ICD-10-CM | POA: Diagnosis not present

## 2020-07-15 NOTE — Patient Outreach (Signed)
Medicaid Managed Care Social Work Note  07/15/2020 Name:  Suzanne [name] MRN:  [id number] DOB:  14-Sep-1960  Suzanne [name] is an 60 y.o. year old female who is a primary patient of Fulp, Cammie, MD (Inactive).  The Medicaid Managed Care Coordination team was consulted for assistance with:  Community Resources   Ms. Talerico was given information about Medicaid Managed CareCoordination services today. [name] agreed to services and verbal consent obtained.  Engaged with patient  for by telephone forinitial visit in response to referral for case management and/or care coordination services.   Assessments/Interventions:  Review of past medical history, allergies, medications, health status, including review of consultants reports, laboratory and other test data, was performed as part of comprehensive evaluation and provision of chronic care management services.  SDOH: (Social Determinant of Health) assessments and interventions performed:   BSW spoke with patient. BSW provided patient with the website [email or web address] to apply for foodstamps online and the phone number for Aging and Disability Services [phone].  Advanced Directives Status:  Not addressed in this encounter.  Care Plan                 No Known Allergies  Medications Reviewed Today    Reviewed by Melissa Montane, RN (Registered Nurse) on 07/14/20 at 1514  Med List Status: <None>  Medication Order Taking? Sig Documenting Provider Last Dose Status Informant  anastrozole (ARIMIDEX) 1 MG tablet [id number] Yes Take 1 tablet (1 mg total) by mouth daily. [name], Virgie Dad, MD Taking Active   antiseptic oral rinse Charlene Brooke) LIQD [id number] No 15 mLs by Mouth Rinse route as needed for dry mouth.  Patient not taking: Reported on 07/14/2020   [provider] Not Taking Active Family Member  Blood Pressure Monitor [place] [id number] Yes Use to check blood pressure twice per day and more often if not feeling well Fulp, Cammie,  MD Taking Active   calcium-vitamin D (OSCAL WITH D) 500-200 MG-UNIT tablet [id number] Yes Take 1 tablet by mouth 3 (three) times daily. Rai, Vernelle Emerald, MD Taking Active Family Member  cetirizine (ZYRTEC) 10 MG tablet [id number] No Take 1 tablet (10 mg total) by mouth daily.  Patient not taking: Reported on 07/14/2020   [name], NP Not Taking Active   famotidine (PEPCID) 20 MG tablet [id number] Yes Take 1 tablet (20 mg total) by mouth 2 (two) times daily as needed for heartburn or indigestion. [name], Virgie Dad, MD Taking Active   furosemide (LASIX) 20 MG tablet [id number] Yes Take 1 tablet (20 mg total) by mouth daily. [name], Virgie Dad, MD Taking Active   ketoconazole (NIZORAL) 2 % cream [id number] Yes Apply to affected areas daily as needed [name], Virgie Dad, MD Taking Active   MELATONIN PO [id number] Yes Take by mouth. [provider] Taking Active   methocarbamol (ROBAXIN) 500 MG tablet [id number]  Take 500 mg by mouth every 6 (six) hours as needed. [provider]  Active   nystatin (MYCOSTATIN/NYSTOP) powder [id number] Yes Apply 1 application topically as needed. [provider] Taking Active   palbociclib [name]) 75 MG tablet [id number] No Take 1 tablet (75 mg total) by mouth daily. TAKE 1 TABLET (100 MG TOTAL) BY MOUTH DAILY. TAKE FOR 21 DAYS ON, 7 DAYS OFF, REPEAT EVERY 28 DAYS.  Patient not taking: No sig reported   [name], Virgie Dad, MD Not Taking Active            Med Note Marland Kitchen, [name]   Fri  Jul 08, 2020  3:28 PM) Told to restart on Monday by Oncologist  polyethylene glycol (MIRALAX / GLYCOLAX) 17 g packet [id number] Yes Take 17 g by mouth as needed for constipation. [provider] Taking Active Family Member  sertraline (ZOLOFT) 50 MG tablet [id number] Yes Take 1.5 tablets (75 mg total) by mouth at bedtime. 90 day supply is 135 tablets [name], NP Taking Active   ZOFRAN 8 MG tablet [id number] Yes Take 8 mg by mouth every 8 (eight)  hours. [provider] Taking Active   Med List Note [name], RPH-CPP 11/13/19 0998): [name] filled at [place]          Patient Active Problem List   Diagnosis Date Noted  . Intractable back pain 02/03/2019  . Thoracic compression fracture, closed, initial encounter ([place]) 02/03/2019  . Essential hypertension 02/03/2019  . Genetic testing 01/27/2019  . Acute pharyngitis   . Bacteremia   . FTT (failure to thrive) in adult   . Fracture closed, humerus, shaft 01/09/2019  . Pneumonia 12/25/2018  . Acute respiratory disorder in immunocompromised patient ([place]) 12/25/2018  . Family history of breast cancer   . Family history of prostate cancer   . Family history of melanoma   . Family history of colon cancer   . Pressure injury of skin 11/24/2018  . Chronic respiratory failure with hypoxia and hypercapnia ([place]) 11/19/2018  . 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 Hypophosphatemia 11/19/2018  . Hypocalcemia 11/19/2018  . Aspiration pneumonia ([place]) 11/19/2018  . SOB (shortness of breath)   . Palliative care by specialist   . Carcinoma of breast, estrogen receptor positive, stage 4, right ([place]) 11/16/2018  . Hypoalbuminemia 11/16/2018  . Pleural effusion 11/14/2018  . Goals of care, counseling/discussion 11/06/2018  . Malignant neoplasm of overlapping sites of right breast in female, estrogen receptor positive ([place]) 10/31/2018  . Lung metastasis ([place]) 10/31/2018  . Pain from bone metastases ([place]) 10/31/2018  . Morbid obesity with BMI of 40.0-44.9, adult ([place]) 10/31/2018  . Bone injury   . Pleural effusion, bilateral   . Shortness of breath   . Abnormal breast finding   . Hypokalemia   . Breast skin changes   . Macrocytic anemia   . Bone metastasis ([place])   . Anemia, B12 deficiency 10/26/2018    Conditions to be addressed/monitored per PCP order:  grocery delivery/transportation  Care Plan : General Plan of Care (Adult)  Updates made by  Ethelda Chick since 07/15/2020 12:00 AM    Problem: Health Promotion or Disease Self-Management (General Plan of Care)     Long-Range Goal: Self-Management Plan Developed   Start Date: 07/14/2020  Expected End Date: 09/19/2020  Recent Progress: On track  Priority: High  Note:   Current Barriers:  . Care Coordination needs related to managing breast cancer treatment. Ms. Youngren is being treated for stage IV breast cancer. She and her daughter live together. Ms. Dormer is unable to drive and her daughter does not drive. This makes it difficult to get groceries and OTC medications. They have had groceries delivered, which is very expensive after paying the extra fees. Ms Molla is able to walk around in the house, but requires a wheelchair when going to the [place] for appointments. Her wheelchair is missing a screw on the leg support and the brake is not working correctly. They have called [employer] for assistance with fixing the wheelchair and were told they would need to bring the wheelchair to  the office, which is in [facility]. . Film/video editor.  . Transportation barriers . Difficulty obtaining medications . Food Insecurities  Nurse Case Manager Clinical Goal(s):  Marland Kitchen Over the next 30 days, patient will work with [place] to address needs related to procuring groceries and assistance applying for food benefits . Over the next 30 days, patient will meet with RN Care Manager to address barriers to managing her healthcare at home . Over the next 30 days, patient will work with CM team pharmacist for medication management  Interventions:  . Inter-disciplinary care team collaboration (see longitudinal plan of care) . Evaluation of current treatment plan related to anemia and patient's adherence to plan as established by provider. . Reviewed medications with patient and discussed difficulty obtaining OTC medications . Discussed plans with patient for ongoing care management follow up  and provided patient with direct contact information for care management team . Provided patient with MyChart educational materials related to anemia . Care Guide referral for food insecurities . Pharmacy referral for medication management . Follow up with [employer] for repair of wheelchair-Call to [facility], Branch manager should be calling Ms. Caffey with a plan to swap out her wheelchair or wheelchair repair. BSW spoke with patient. BSW provided patient with the website [email or web address] to apply for foodstamps online and the phone number for Aging and Disability Services [phone].  Patient Goals/Self-Care Activities Over the next 30 days, patient will: - begin a notebook of services in my neighborhood or community - call 211 when I need some help - follow-up on any referrals for help I am given - have a back-up plan - make a list of family or friends that I can call  - call [phone] for medical transportation provided by [facility] - call to cancel if needed - keep a calendar with prescription refill dates - keep a calendar with appointment dates   Follow Up Plan: Telephone follow up appointment with Managed Medicaid care management team member scheduled for:08/17/20 @ 2:30pm        Follow up:  Patient agrees to Care Plan and Follow-up.  Plan: The Managed Medicaid care management team will reach out to the patient again over the next 14 days.  Date/time of next scheduled Social Work care management/care coordination outreach:  08/04/20 Mickel Fuchs, [place], [place]  High Risk Managed Medicaid Team

## 2020-07-15 NOTE — Patient Instructions (Signed)
Visit Information  Suzanne [name] was given information about Medicaid Managed Care team care coordination services as a part of their [place] Medicaid benefit. Suzanne [name] verbally consented to engagement with the [facility] Managed Care team.   Thank you for speaking with me today Suzanne [name]. To apply for foodstamps please visit [email or web address] and complete the application. Once submitted you will receive a telephone call from a case worker to complete an interview. For transportation, please contact Aging and Disability services of [place] [phone].  For questions related to your [facility], please call: [phone] or visit the homepage here: [email or web address]  If you would like to schedule transportation through your [facility], please call the following number at least 2 days in advance of your appointment: [phone]  Suzanne [name] - following are the goals we discussed in your visit today:  Goals Addressed   None       Social Worker will follow up in 14 days.   Suzanne [name]  Following is a copy of your plan of care:  Patient Care Plan: General Plan of Care (Adult)    Problem Identified: Health Promotion or Disease Self-Management (General Plan of Care)     Long-Range Goal: Self-Management Plan Developed   Start Date: 07/14/2020  Expected End Date: 09/19/2020  Recent Progress: On track  Priority: High  Note:   Current Barriers:  . Care Coordination needs related to managing breast cancer treatment. Suzanne [name] is being treated for stage IV breast cancer. She and her daughter live together. Suzanne [name] is unable to drive and her daughter does not drive. This makes it difficult to get groceries and OTC medications. They have had groceries delivered, which is very expensive after paying the extra fees. Suzanne [name] is able to walk around in the house, but  requires a wheelchair when going to the [place] for appointments. Her wheelchair is missing a screw on the leg support and the brake is not working correctly. They have called [employer] for assistance with fixing the wheelchair and were told they would need to bring the wheelchair to the office, which is in [facility]. . Film/video editor.  . Transportation barriers . Difficulty obtaining medications . Food Insecurities  Nurse Case Manager Clinical Goal(s):  Marland Kitchen Over the next 30 days, patient will work with [place] to address needs related to procuring groceries and assistance applying for food benefits . Over the next 30 days, patient will meet with RN Care Manager to address barriers to managing her healthcare at home . Over the next 30 days, patient will work with CM team pharmacist for medication management  Interventions:  . Inter-disciplinary care team collaboration (see longitudinal plan of care) . Evaluation of current treatment plan related to anemia and patient's adherence to plan as established by provider. . Reviewed medications with patient and discussed difficulty obtaining OTC medications . Discussed plans with patient for ongoing care management follow up and provided patient with direct contact information for care management team . Provided patient with MyChart educational materials related to anemia . Care Guide referral for food insecurities . Pharmacy referral for medication management . Follow up with [employer] for repair of wheelchair-Call to [facility], Branch manager should be calling Suzanne. [name] with a plan to swap out her wheelchair or wheelchair repair. BSW spoke with patient. BSW provided patient with the website [email or web address] to apply for foodstamps online and the phone number for Aging and  Disability Services [phone].  Patient Goals/Self-Care Activities Over the next 30 days, patient will: - begin a notebook of services in my neighborhood or  community - call 211 when I need some help - follow-up on any referrals for help I am given - have a back-up plan - make a list of family or friends that I can call  - call [phone] for medical transportation provided by [facility] - call to cancel if needed - keep a calendar with prescription refill dates - keep a calendar with appointment dates   Follow Up Plan: Telephone follow up appointment with Managed Medicaid care management team member scheduled for:08/17/20 @ 2:30pm

## 2020-07-18 ENCOUNTER — Other Ambulatory Visit: Payer: Self-pay | Admitting: Oncology

## 2020-07-18 DIAGNOSIS — C7951 Secondary malignant neoplasm of bone: Secondary | ICD-10-CM

## 2020-07-18 DIAGNOSIS — C7801 Secondary malignant neoplasm of right lung: Secondary | ICD-10-CM

## 2020-07-18 DIAGNOSIS — C50911 Malignant neoplasm of unspecified site of right female breast: Secondary | ICD-10-CM

## 2020-07-18 DIAGNOSIS — C50811 Malignant neoplasm of overlapping sites of right female breast: Secondary | ICD-10-CM | POA: Diagnosis not present

## 2020-07-19 ENCOUNTER — Other Ambulatory Visit: Payer: Self-pay

## 2020-07-19 ENCOUNTER — Other Ambulatory Visit: Payer: Self-pay | Admitting: Nurse Practitioner

## 2020-07-19 DIAGNOSIS — I959 Hypotension, unspecified: Secondary | ICD-10-CM

## 2020-07-19 DIAGNOSIS — C50811 Malignant neoplasm of overlapping sites of right female breast: Secondary | ICD-10-CM | POA: Diagnosis not present

## 2020-07-19 DIAGNOSIS — F329 Major depressive disorder, single episode, unspecified: Secondary | ICD-10-CM

## 2020-07-19 DIAGNOSIS — R42 Dizziness and giddiness: Secondary | ICD-10-CM

## 2020-07-19 DIAGNOSIS — I10 Essential (primary) hypertension: Secondary | ICD-10-CM

## 2020-07-19 DIAGNOSIS — J302 Other seasonal allergic rhinitis: Secondary | ICD-10-CM

## 2020-07-19 DIAGNOSIS — C50919 Malignant neoplasm of unspecified site of unspecified female breast: Secondary | ICD-10-CM

## 2020-07-19 MED ORDER — SERTRALINE HCL 50 MG PO TABS: 75.0000 mg | ORAL_TABLET | Freq: Every day | ORAL | 0 refills | Status: DC

## 2020-07-19 MED ORDER — BLOOD PRESSURE MONITOR MISC: 0 refills | Status: DC

## 2020-07-19 MED ORDER — CETIRIZINE HCL 10 MG PO TABS: 10.0000 mg | ORAL_TABLET | Freq: Every day | ORAL | 3 refills | Status: DC

## 2020-07-19 NOTE — Patient Outreach (Signed)
Medicaid Managed Care    Pharmacy Note  07/19/2020 Name: Suzanne [name] MRN: [id number] DOB: 03/20/1961  Suzanne [name] is a 60 y.o. year old female who is a primary care patient of Fulp, Cammie, MD (Inactive). The [facility] Managed Care Coordination team was consulted for assistance with disease management and care coordination needs.    Engaged with patient Engaged with patient by telephone for initial visit in response to referral for case management and/or care coordination services.  Suzanne [name] was given information about Managed Medicaid Care Coordination team services today. Suzanne [name] agreed to services and verbal consent obtained.   Objective:  Lab Results  Component Value Date   CREATININE 0.83 07/07/2020   CREATININE 0.84 06/23/2020   CREATININE 1.10 (H) 05/17/2020    Lab Results  Component Value Date   HGBA1C 5.3 10/27/2018       Component Value Date/Time   CHOL 111 10/27/2018 0859   TRIG 120 01/10/2019 0500   HDL 24 (L) 10/27/2018 0859   CHOLHDL 4.6 10/27/2018 0859   VLDL 23 10/27/2018 0859   LDLCALC 64 10/27/2018 0859    Other: (TSH, CBC, Vit D, etc.)  Clinical ASCVD: No  The ASCVD Risk score Suzanne Bussing DC Jr., et al., 2011/08/23) failed to calculate for the following reasons:   The valid total cholesterol range is 130 to 320 mg/dL    Other: (CHADS2VASc if Afib, PHQ9 if depression, MMRC or CAT for COPD, ACT, DEXA)  BP Readings from Last 3 Encounters:  07/07/20 (!) 152/77  06/24/20 135/68  06/23/20 140/60    Assessment/Interventions: Review of patient past medical history, allergies, medications, health status, including review of consultants reports, laboratory and other test data, was performed as part of comprehensive evaluation and provision of chronic care management services.   Hypotension -Tests daily, no issues recently Plan: At goal,  patient stable/ symptoms controlled   Stage IV Breast Cancer Anastrozole Palobciclib Suzanne [name]) -Gets  medicine from [place]: At goal,  patient stable/ symptoms controlled   Mood Depression screen [facility] 2/9 07/08/2020 09/18/2019 05/06/2019  Decreased Interest 0 0 1  Down, Depressed, Hopeless 0 0 1  PHQ - 2 Score 0 0 2  Altered sleeping 0 0 0  Tired, decreased energy 0 0 1  Change in appetite 0 0 0  Feeling bad or failure about yourself  0 0 0  Trouble concentrating 0 0 0  Moving slowly or fidgety/restless 0 0 1  Suicidal thoughts 0 0 0  PHQ-9 Score 0 0 4  Difficult doing work/chores - Not difficult at all -  Some recent data might be hidden   Sertraline 75mg   -Lives with daughter. Both from [facility] and moved here when [place] was 8. Husband passed away in 23-Aug-2011. Whole family is college educated and they are stressed about their situation, lack of transportation, amongst other issues. -States 75mg  is perfect dose Plan: At goal,  patient stable/ symptoms controlled   Edema Furosemide 20mg  QD Plan: At goal,  patient stable/ symptoms controlled  Meds -Patient doesn't drive. She lives with daughter Suzanne [name]) who also can't drive. They therefore have difficulty getting meds from the Pharmacy. They utilize Fed-Ex to pick up pills but this process is fraught with trouble and they can't always get what they want when they want it.  Verbal consent obtained for [facility] enhanced pharmacy services (medication synchronization, adherence packaging, delivery coordination). A medication sync plan was created to allow patient to get all medications delivered once  every 30 to 90 days per patient preference. Patient understands they have freedom to choose pharmacy and clinical pharmacist will coordinate care between all prescribers and [facility].     SDOH (Social Determinants of Health) assessments and interventions performed:    Care Plan  No Known Allergies  Medications Reviewed Today    Reviewed by Suzanne [name] (Pharmacist) on 07/19/20 at 1617  Med List  Status: <None>  Medication Order Taking? Sig Documenting Provider Last Dose Status Informant  anastrozole (ARIMIDEX) 1 MG tablet [id number] Yes Take 1 tablet (1 mg total) by mouth daily. Suzanne [name], Suzanne Dad, MD Taking Active   antiseptic oral rinse Suzanne [name]) LIQD [id number] No 15 mLs by Mouth Rinse route as needed for dry mouth.  Patient not taking: No sig reported   [provider] Not Taking Consider Medication Status and Discontinue   Blood Pressure Monitor MISC [id number] Yes Use to check blood pressure twice per day and more often if not feeling well Fulp, Cammie, MD Taking Active   calcium-vitamin D (OSCAL WITH D) 500-200 MG-UNIT tablet [id number] Yes Take 1 tablet by mouth 3 (three) times daily. Suzanne [name], Suzanne Emerald, MD Taking Active Family Member  cetirizine (ZYRTEC) 10 MG tablet [id number] Yes Take 1 tablet (10 mg total) by mouth daily. Suzanne Pounds, NP Taking Active   famotidine (PEPCID) 20 MG tablet [id number] Yes Take 1 tablet (20 mg total) by mouth 2 (two) times daily as needed for heartburn or indigestion. Suzanne [name], Suzanne Dad, MD Taking Active   furosemide (LASIX) 20 MG tablet [id number] Yes TAKE 1 TABLET(20 MG) BY MOUTH DAILY Suzanne [name], Suzanne Dad, MD Taking Active   ketoconazole (NIZORAL) 2 % cream [id number] Yes Apply to affected areas daily as needed Suzanne [name], Suzanne Dad, MD Taking Active   MELATONIN PO [id number] Yes Take by mouth. [provider] Taking Active   methocarbamol (ROBAXIN) 500 MG tablet [id number] Yes Take 500 mg by mouth every 6 (six) hours as needed. [provider] Taking Active   nystatin (MYCOSTATIN/NYSTOP) powder [id number] Yes Apply 1 application topically as needed. [provider] Taking Active   palbociclib Suzanne [name]) 75 MG tablet [id number] Yes Take 1 tablet (75 mg total) by mouth daily. TAKE 1 TABLET (100 MG TOTAL) BY MOUTH DAILY. TAKE FOR 21 DAYS ON, 7 DAYS OFF, REPEAT EVERY 28 DAYS. Suzanne [name], Suzanne Dad, MD Taking Active            Med Note  Suzanne [name]   Fri Jul 08, 2020  3:28 PM) Told to restart on Monday by Oncologist  polyethylene glycol (MIRALAX / GLYCOLAX) 17 g packet [id number] Yes Take 17 g by mouth as needed for constipation. [provider] Taking Active Family Member  sertraline (ZOLOFT) 50 MG tablet [id number] Yes Take 1.5 tablets (75 mg total) by mouth at bedtime. 90 day supply is 135 tablets Suzanne Pounds, NP Taking Active   ZOFRAN 8 MG tablet [id number] Yes Take 8 mg by mouth every 8 (eight) hours. [provider] Taking Active   Med List Note [name], RPH-CPP 11/13/19 [id number]): Suzanne [name] filled at [facility]          Patient Active Problem List   Diagnosis Date Noted  . Intractable back pain 02/03/2019  . Thoracic compression fracture, closed, initial encounter ([place]) 02/03/2019  . Essential hypertension 02/03/2019  . Genetic testing 01/27/2019  . Acute pharyngitis   . Bacteremia   . FTT (failure to thrive) in adult   . Fracture closed,  humerus, shaft 01/09/2019  . Pneumonia 12/25/2018  . Acute respiratory disorder in immunocompromised patient ([place]) 12/25/2018  . Family history of breast cancer   . Family history of prostate cancer   . Family history of melanoma   . Family history of colon cancer   . Pressure injury of skin 11/24/2018  . Chronic respiratory failure with hypoxia and hypercapnia ([place]) 11/19/2018  . 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 Hypophosphatemia 11/19/2018  . Hypocalcemia 11/19/2018  . Aspiration pneumonia ([place]) 11/19/2018  . SOB (shortness of breath)   . Palliative care by specialist   . Carcinoma of breast, estrogen receptor positive, stage 4, right ([place]) 11/16/2018  . Hypoalbuminemia 11/16/2018  . Pleural effusion 11/14/2018  . Goals of care, counseling/discussion 11/06/2018  . Malignant neoplasm of overlapping sites of right breast in female, estrogen receptor positive ([place]) 10/31/2018  . Lung metastasis ([place]) 10/31/2018  . Pain  from bone metastases ([place]) 10/31/2018  . Morbid obesity with BMI of 40.0-44.9, adult ([place]) 10/31/2018  . Bone injury   . Pleural effusion, bilateral   . Shortness of breath   . Abnormal breast finding   . Hypokalemia   . Breast skin changes   . Macrocytic anemia   . Bone metastasis ([place])   . Anemia, B12 deficiency 10/26/2018    Conditions to be addressed/monitored: Breast Cancer   Care Plan : Medication Management  Updates made by Suzanne [name], [place] since 07/19/2020 12:00 AM    Problem: Health Promotion or Disease Self-Management (General Plan of Care)     Goal: Medication Management   Note:   Evidence-based guidance:   Review biopsychosocial determinants of health screens.   Determine level of modifiable health risk.   Assess level of patient activation, level of readiness, importance and confidence to make changes.   Evoke change talk using open-ended questions, pros and cons, as well as looking forward.   Identify areas where behavior change may lead to improved health.   Partner with patient to develop a robust self-management plan that includes lifestyle factors, such as weight loss, exercise and healthy nutrition, as well as goals specific to disease risks.   Support patient and family/caregiver active participation in decision-making and self-management plan.   Implement additional goals and interventions based on identified risk factors to reduce health risk.   Facilitate advance care planning.   Review need for preventive screening based on age, sex, family history and health history.   Notes:    Task: Mutually Develop and Foster Achievement of Patient Goals   Note:   Care Management Activities:    - verbalization of feelings encouraged    Notes:       Medication Assistance: Utilizing Enhanced Pharmacy Services Delivery Verbal consent obtained for [facility] enhanced pharmacy services (medication synchronization, adherence packaging,  delivery coordination). A medication sync plan was created to allow patient to get all medications delivered once every 30 to 90 days per patient preference. Patient understands they have freedom to choose pharmacy and clinical pharmacist will coordinate care between all prescribers and [facility].   Follow up: Agree   Plan: The care management team will reach out to the patient again over the next 30 days.   [place] Constable, Pharm.D., Managed Medicaid Pharmacist - [phone]

## 2020-07-19 NOTE — Patient Instructions (Addendum)
Visit Information  Suzanne [name] was given information about Medicaid Managed Care team care coordination services as a part of their [place] Medicaid benefit. Suzanne [name] verbally consented to engagement with the [facility] Managed Care team.   For questions related to your [facility], please call: [phone] or visit the homepage here: [email or web address]  If you would like to schedule transportation through your [facility], please call the following number at least 2 days in advance of your appointment: [phone].   Suzanne [name] - following are the goals we discussed in your visit today:  Goals Addressed            This Visit's Progress   . Manage My Medicine       Timeframe:  Short-Term Goal Priority:  High Start Date:     Immediately                        Expected End Date:                       Follow Up Date Monthly   - call for medicine refill 2 or 3 days before it runs out    Why is this important?   . These steps will help you keep on track with your medicines.   Notes: Patient can't drive and neither can daughter. They get meds Fed-Ex'd from Pharmacy but the delivery service doesn't always work  Verbal consent obtained for [facility] enhanced pharmacy services (medication synchronization, adherence packaging, delivery coordination). A medication sync plan was created to allow patient to get all medications delivered once every 30 to 90 days per patient preference. Patient understands they have freedom to choose pharmacy and clinical pharmacist will coordinate care between all prescribers and [facility].        Please see education materials related to Hypotension provided as print materials.   Patient verbalizes understanding of instructions provided today.   The Managed Medicaid care management team will reach out to the  patient again over the next 30 days.   Lane Hacker, [facility]  Following is a copy of your plan of care:  Patient Care Plan: General Plan of Care (Adult)    Problem Identified: Health Promotion or Disease Self-Management (General Plan of Care)     Long-Range Goal: Self-Management Plan Developed   Start Date: 07/14/2020  Expected End Date: 09/19/2020  Recent Progress: On track  Priority: High  Note:   Current Barriers:  . Care Coordination needs related to managing breast cancer treatment. Suzanne [name] is being treated for stage IV breast cancer. She and her daughter live together. Suzanne [name] is unable to drive and her daughter does not drive. This makes it difficult to get groceries and OTC medications. They have had groceries delivered, which is very expensive after paying the extra fees. Suzanne [name] is able to walk around in the house, but requires a wheelchair when going to the [place] for appointments. Her wheelchair is missing a screw on the leg support and the brake is not working correctly. They have called [employer] for assistance with fixing the wheelchair and were told they would need to bring the wheelchair to the office, which is in [place]. . Film/video editor.  . Transportation barriers . Difficulty obtaining medications . Food Insecurities  Nurse Case Manager Clinical Goal(s):  Suzanne [name] Kitchen Over the next 30 days, patient will work  with Care Guide to address needs related to procuring groceries and assistance applying for food benefits . Over the next 30 days, patient will meet with RN Care Manager to address barriers to managing her healthcare at home . Over the next 30 days, patient will work with CM team pharmacist for medication management  Interventions:  . Inter-disciplinary care team collaboration (see longitudinal plan of care) . Evaluation of current treatment plan related to anemia and patient's adherence to plan as established by provider. . Reviewed medications with  patient and discussed difficulty obtaining OTC medications . Discussed plans with patient for ongoing care management follow up and provided patient with direct contact information for care management team . Provided patient with MyChart educational materials related to anemia . Care Guide referral for food insecurities . Pharmacy referral for medication management . Follow up with [employer] for repair of wheelchair-Call to [facility], Branch manager should be calling Suzanne [name] with a plan to swap out her wheelchair or wheelchair repair. Suzanne [name] spoke with patient. Suzanne [name] provided patient with the website epass.uMourn.cz to apply for foodstamps online and the phone number for Aging and Disability Services [phone].  Patient Goals/Self-Care Activities Over the next 30 days, patient will: - begin a notebook of services in my neighborhood or community - call 211 when I need some help - follow-up on any referrals for help I am given - have a back-up plan - make a list of family or friends that I can call  - call [phone] for medical transportation provided by [facility] - call to cancel if needed - keep a calendar with prescription refill dates - keep a calendar with appointment dates   Follow Up Plan: Telephone follow up appointment with Managed Medicaid care management team member scheduled for:08/17/20 @ 2:30pm      Patient Care Plan: Medication Management    Problem Identified: Health Promotion or Disease Self-Management (General Plan of Care)     Goal: Medication Management   Note:   Evidence-based guidance:   Review biopsychosocial determinants of health screens.   Determine level of modifiable health risk.   Assess level of patient activation, level of readiness, importance and confidence to make changes.   Evoke change talk using open-ended questions, pros and cons, as well as looking forward.   Identify areas where behavior change may lead to improved health.    Partner with patient to develop a robust self-management plan that includes lifestyle factors, such as weight loss, exercise and healthy nutrition, as well as goals specific to disease risks.   Support patient and family/caregiver active participation in decision-making and self-management plan.   Implement additional goals and interventions based on identified risk factors to reduce health risk.   Facilitate advance care planning.   Review need for preventive screening based on age, sex, family history and health history.   Notes:    Task: Mutually Develop and Royce Macadamia Achievement of Patient Goals   Note:   Care Management Activities:    - verbalization of feelings encouraged    Notes:      How to Take Your Blood Pressure Blood pressure measures how strongly your blood is pressing against the walls of your arteries. Arteries are blood vessels that carry blood from your heart throughout your body. You can take your blood pressure at home with a machine. You may need to check your blood pressure at home:  To check if you have high blood pressure (hypertension).  To check your blood pressure over  time.  To make sure your blood pressure medicine is working. Supplies needed:  Blood pressure machine, or monitor.  Dining room chair to sit in.  Table or desk.  Small notebook.  Pencil or pen. How to prepare Avoid these things for 30 minutes before checking your blood pressure:  Having drinks with caffeine in them, such as coffee or tea.  Drinking alcohol.  Eating.  Smoking.  Exercising. Do these things five minutes before checking your blood pressure:  Go to the bathroom and pee (urinate).  Sit in a dining chair. Do not sit in a soft couch or an armchair.  Be quiet. Do not talk. How to take your blood pressure Follow the instructions that came with your machine. If you have a digital blood pressure monitor, these may be the instructions: 1. Sit up  straight. 2. Place your feet on the floor. Do not cross your ankles or legs. 3. Rest your left arm at the level of your heart. You may rest it on a table, desk, or chair. 4. Pull up your shirt sleeve. 5. Wrap the blood pressure cuff around the upper part of your left arm. The cuff should be 1 inch (2.5 cm) above your elbow. It is best to wrap the cuff around bare skin. 6. Fit the cuff snugly around your arm. You should be able to place only one finger between the cuff and your arm. 7. Place the cord so that it rests in the bend of your elbow. 8. Press the power button. 9. Sit quietly while the cuff fills with air and loses air. 10. Write down the numbers on the screen. 11. Wait 2-3 minutes and then repeat steps 1-10.   What do the numbers mean? Two numbers make up your blood pressure. The first number is called systolic pressure. The second is called diastolic pressure. An example of a blood pressure reading is "120 over 80" (or 120/80). If you are an adult and do not have a medical condition, use this guide to find out if your blood pressure is normal: Normal  First number: below 120.  Second number: below 80. Elevated  First number: 120-129.  Second number: below 80. Hypertension stage 1  First number: 130-139.  Second number: 80-89. Hypertension stage 2  First number: 140 or above.  Second number: 32 or above. Your blood pressure is above normal even if only the top or bottom number is above normal. Follow these instructions at home:  Check your blood pressure as often as your doctor tells you to.  Check your blood pressure at the same time every day.  Take your monitor to your next doctor's appointment. Your doctor will: ? Make sure you are using it correctly. ? Make sure it is working right.  Make sure you understand what your blood pressure numbers should be.  Tell your doctor if your medicine is causing side effects.  Keep all follow-up visits as told by your  doctor. This is important. General tips:  You will need a blood pressure machine, or monitor. Your doctor can suggest a monitor. You can buy one at a drugstore or online. When choosing one: ? Choose one with an arm cuff. ? Choose one that wraps around your upper arm. Only one finger should fit between your arm and the cuff. ? Do not choose one that measures your blood pressure from your wrist or finger. Where to find more information American Heart Association: www.heart.org Contact a doctor if:  Your blood  pressure keeps being high. Get help right away if:  Your first blood pressure number is higher than 180.  Your second blood pressure number is higher than 120. Summary  Check your blood pressure at the same time every day.  Avoid caffeine, alcohol, smoking, and exercise for 30 minutes before checking your blood pressure.  Make sure you understand what your blood pressure numbers should be. This information is not intended to replace advice given to you by your health care provider. Make sure you discuss any questions you have with your health care provider. Document Revised: 05/08/2019 Document Reviewed: 05/08/2019 Elsevier Patient Education  2021 [employer].

## 2020-07-20 ENCOUNTER — Telehealth: Payer: Self-pay | Admitting: *Deleted

## 2020-07-20 DIAGNOSIS — C50811 Malignant neoplasm of overlapping sites of right female breast: Secondary | ICD-10-CM | POA: Diagnosis not present

## 2020-07-20 IMAGING — NM NUCLEAR MEDICINE WHOLE BODY BONE SCINTIGRAPHY
2 series · 2 of 2 positions shown · non-contrast
Comparison: CT scan October 26, 2018

CLINICAL DATA: Breast cancer staging.

EXAM:
NUCLEAR MEDICINE WHOLE BODY BONE SCAN
TECHNIQUE: Whole body anterior and posterior images were obtained approximately
3 hours after intravenous injection of radiopharmaceutical.
RADIOPHARMACEUTICALS:  20.6 mCi Vechnetium-WWm MDP IV

[Series 1: whole body · 2.66mm/px · 1 of 1 slices shown (1 of 2)]
[im 1/1]
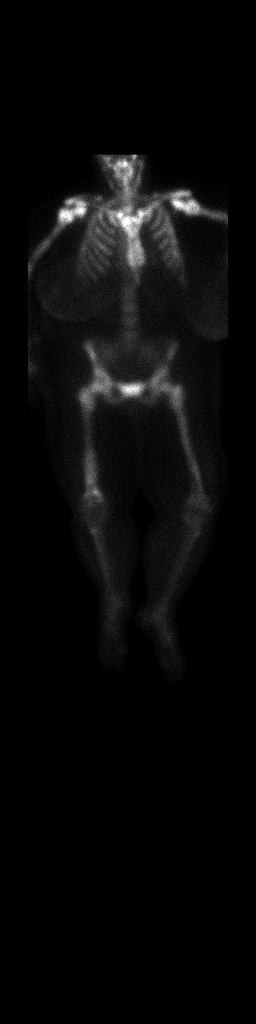

[Series 1: whole body · 2.66mm/px · 1 of 1 slices shown (2 of 2)]
[im 1/1]
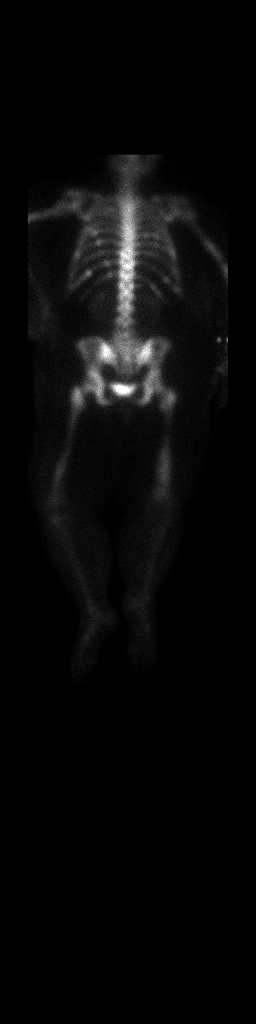

[2 of 2 positions shown; findings below may reference images not displayed]

FINDINGS: Bony metastatic disease is seen in both of the femurs, numerous
ribs, the manubrium, and right humerus. The widespread bony
metastatic disease in the scapula, thoracic spine, sternum, and
proximal humeri is better appreciated on the comparison CT scan.
IMPRESSION: Widespread bony metastatic disease. The extent of metastatic disease
is actually better appreciated on the recent CT imaging. This study
is a near super scan consistent with widespread bony metastatic
disease.

## 2020-07-20 NOTE — Patient Outreach (Signed)
Care Coordination  07/20/2020  AMBRIELLE KINGTON [birth year] [id number]   Ms. Kissinger met with MM Pharmacist and reported that she had not heard from [employer] regarding repair/replacement of broken wheelchair. This RNCM made call to [employer] for follow up of conversation on 07/14/20. A voicemail message was left following up on call to [employer] made on 07/14/20 regarding repair or replacement of patient's wheelchair. RNCM awaiting return call. RNCM will notify patient when update of situation received.  [name] RN, BSN [place]  [employer] manager

## 2020-07-22 ENCOUNTER — Other Ambulatory Visit: Payer: Self-pay | Admitting: Adult Health

## 2020-07-22 ENCOUNTER — Other Ambulatory Visit: Payer: Self-pay

## 2020-07-22 DIAGNOSIS — C7951 Secondary malignant neoplasm of bone: Secondary | ICD-10-CM

## 2020-07-22 DIAGNOSIS — C50811 Malignant neoplasm of overlapping sites of right female breast: Secondary | ICD-10-CM

## 2020-07-22 DIAGNOSIS — C7801 Secondary malignant neoplasm of right lung: Secondary | ICD-10-CM

## 2020-07-22 DIAGNOSIS — Z170 Estrogen receptor positive status [ER+]: Secondary | ICD-10-CM

## 2020-07-22 DIAGNOSIS — C799 Secondary malignant neoplasm of unspecified site: Secondary | ICD-10-CM

## 2020-07-22 DIAGNOSIS — C50911 Malignant neoplasm of unspecified site of right female breast: Secondary | ICD-10-CM

## 2020-07-22 MED ORDER — ZOFRAN 8 MG PO TABS: 8.0000 mg | ORAL_TABLET | Freq: Three times a day (TID) | ORAL | 1 refills | Status: DC

## 2020-07-22 MED ORDER — FAMOTIDINE 20 MG PO TABS: 20.0000 mg | ORAL_TABLET | Freq: Two times a day (BID) | ORAL | 3 refills | Status: DC | PRN

## 2020-07-22 MED ORDER — ANASTROZOLE 1 MG PO TABS: 1.0000 mg | ORAL_TABLET | Freq: Every day | ORAL | 3 refills | Status: DC

## 2020-07-22 MED ORDER — FUROSEMIDE 20 MG PO TABS: 20.0000 mg | ORAL_TABLET | Freq: Every day | ORAL | 4 refills | Status: DC

## 2020-07-22 MED ORDER — KETOCONAZOLE 2 % EX CREA: TOPICAL_CREAM | CUTANEOUS | 6 refills | Status: DC

## 2020-07-22 MED ORDER — NYSTATIN 100000 UNIT/GM EX POWD: 1.0000 "application " | CUTANEOUS | 1 refills | Status: DC | PRN

## 2020-07-24 NOTE — Progress Notes (Signed)
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  She is accompanied by her daughter.  [name] continues on anastrozole.  Her hot flashes are now very minimal and vaginal dryness is not an issue.  She is also on palbociclib at 75 mg daily 21 days on 7 days off.  She was switched from 100 mg to 75 mg on 07/11/2020 due to cytopenias and specifically the severe anemia.   She receives [place] every 4 weeks. She has no issues with receiving this, and denies any dental concerns.    REVIEW OF SYSTEMS: Suzanne [name] tells me her "yeast is back".  They have continued to use antifungals and that has been very effective for the prior yeast infections.  For some reason that is not working at present.  Aside from that she has no unusual shortness of breath, chest pain or pressure.  She is not aware of palpitations.  A detailed review of systems was otherwise stable   COVID 19 VACCINATION STATUS: Status post Pfizer x2 with booster December 2021   HISTORY OF CURRENT ILLNESS: From the original inpatient consult note:  Suzanne [name] is a 60 year old female from [place], [place] without significant past medical history with exception of obesity, remote history of gallstones, and remote history of mastitis.  The patient presented to the hospital on 10/26/2018 with a 3-week history of worsening shortness  of breath and lower extremity edema.  The patient reported that her shortness of breath worsened with exertion.  Her lower extremity edema did not improve with elevation.  She also had an intermittent pr oductive cough with rare sputum production.  The patient was noted to be anemic with a hemoglobin of 4.5 with a clumped platelets noted on admission.  Stool for occult blood was positive.  Patient received 2 units of packed red blood cells.    The patient reported a rash under her breast x1 week which has been foul-smelling.  She also reported that she has some discomfort and thickening/hardness of her right breast for several months.  She states that her right breast is much larger than her left.  She notes some occasional bloody discharge from her breast once in a while.  A chest x-ray on admission showed diffuse bilateral interstitial opacity of unknown chronicity, findings could be related to interstitial inflammatory process, atypical/viral pneumonia, or possible metastatic disease.  There are also sclerotic and lytic lesions within the clavicles, bilateral ribs, shoulders which was concerning for diffuse few skeletal metastatic disease.    This prompted a CT scan of the chest with contrast which showed bilateral axillary and mediastinal adenopathy, borderline hilar lymph nodes bilaterally, findings likely flecked metastatic lymphadenopathy, moderate bilateral pleural effusions, extensive interstitial thickening and reticulonodular opacities throughout the lungs, cannot exclude lymphangitic spread of tumor, diffuse skeletal metastases.  The patient's subsequent history is as detailed below.   PAST MEDICAL HISTORY: Past Medical History:  Diagnosis Date  . Cancer ([place])   . Family history of  breast cancer   . Family history of colon cancer   . Family history of melanoma   . Family history of prostate cancer   . Gallstones   . Glaucoma   . Mastitis    reports history of recurrent mastitis  .  Metastatic breast cancer ([place])   . Obesity     PAST SURGICAL HISTORY: Past Surgical History:  Procedure Laterality Date  . GLAUCOMA SURGERY  late 1990's  . IR THORACENTESIS ASP PLEURAL SPACE W/IMG GUIDE  10/28/2018  . IR THORACENTESIS ASP PLEURAL SPACE W/IMG GUIDE  10/29/2018  . OPEN REDUCTION INTERNAL FIXATION (ORIF) DISTAL RADIAL FRACTURE Left 01/09/2019   Procedure: OPEN REDUCTION INTERNAL FIXATION (ORIF) HUMERAL FRACTURE;  Surgeon: Suzanne [name];  Location: [facility];  Service: Orthopedics;  Laterality: Left;    FAMILY HISTORY: Family History  Problem Relation Age of Onset  . Breast cancer Sister        in her 66's-70s  . Heart disease Brother        CABG  . Heart disease Brother        CABG  . Skin cancer Brother        melanoma  . Colon cancer Cousin   . Heart attack Mother   . Heart attack Father   . Prostate cancer Brother   Patient's father passed away at age 67, and her mother at 6, both from heart attacks. The patient has 8 siblings, 6 brothers and 1 sister.  Her sister was diagnosed with breast cancer. One brother has prostate cancer, another brother had a melanoma removed.    GYNECOLOGIC HISTORY:  Menarche: 60 years old Age at first live birth: 60 years old [place] P 1 LMP age 29 Contraceptive: used for 2-3 years with no problems HRT never used  Hysterectomy? no   SOCIAL HISTORY:  [name] worked for an Engineer, manufacturing systems. She is now on disability. She is widowed. She lives at home with her daughter.  Suzanne [name], 35, who is a Psychologist, occupational at [name].  The patient has no grandchildren. She is not a Ambulance person.   ADVANCED DIRECTIVES: not on file; she has the paperwork already. She intends to name her daughter as her HCPOA.   HEALTH MAINTENANCE: Social History   Tobacco Use  . Smoking status: Never Smoker  . Smokeless tobacco: Never Used  . Tobacco comment: second hand smoke exposure  Vaping Use  . Vaping Use: Never used  Substance Use  Topics  . Alcohol use: No  . Drug use: No    Colonoscopy:   PAP:   Bone density:  Mammography:   No Known Allergies  Current Outpatient Medications  Medication Sig Dispense Refill  . anastrozole (ARIMIDEX) 1 MG tablet TAKE ONE TABLET BY MOUTH DAILY 30 tablet 3  . antiseptic oral rinse (BIOTENE) LIQD 15 mLs by Mouth Rinse route as needed for dry mouth. (Patient not taking: No sig reported)    . Blood Pressure Monitor MISC Medium sized cuff. Use to check blood pressure twice per day and more often if not feeling well. I10.0 1 each 0  . calcium-vitamin D (OSCAL WITH D) 500-200 MG-UNIT tablet Take 1 tablet by mouth 3 (three) times daily. 90 tablet 1  . cetirizine (ZYRTEC) 10 MG tablet Take 1 tablet (10 mg total) by mouth daily. 90 tablet 3  . famotidine (PEPCID) 20 MG tablet Take 1 tablet (20 mg total) by mouth 2 (two) times daily as needed for heartburn or indigestion. [place]  tablet 3  . furosemide (LASIX) 20 MG tablet Take 1 tablet (20 mg total) by mouth daily. 90 tablet 4  . ketoconazole (NIZORAL) 2 % cream Apply to affected areas daily as needed 30 g 6  . MELATONIN PO Take by mouth.    . methocarbamol (ROBAXIN) 500 MG tablet Take 500 mg by mouth every 6 (six) hours as needed.    . nystatin (MYCOSTATIN/NYSTOP) powder Apply 1 application topically as needed. 15 g 1  . palbociclib (IBRANCE) 75 MG tablet Take 1 tablet (75 mg total) by mouth daily. TAKE 1 TABLET (100 MG TOTAL) BY MOUTH DAILY. TAKE FOR 21 DAYS ON, 7 DAYS OFF, REPEAT EVERY 28 DAYS. 21 tablet 6  . polyethylene glycol (MIRALAX / GLYCOLAX) 17 g packet Take 17 g by mouth as needed for constipation.    . sertraline (ZOLOFT) 50 MG tablet Take 1.5 tablets (75 mg total) by mouth at bedtime. 90 day supply is 135 tablets 135 tablet 0  . ZOFRAN 8 MG tablet Take 1 tablet (8 mg total) by mouth every 8 (eight) hours. 20 tablet 1   No current facility-administered medications for this visit.    OBJECTIVE: White woman examined in a  wheelchair  Vitals:   07/25/20 1538  BP: 135/77  Pulse: 89  Resp: 18  Temp: 97.7 F (36.5 C)  SpO2: 100%     Body mass index is 40.76 kg/m.   Wt Readings from Last 3 Encounters:  07/25/20 201 lb 12.8 oz (91.5 kg)  07/07/20 202 lb 4.8 oz (91.8 kg)  05/17/20 202 lb 4.8 oz (91.8 kg)  ECOG FS: 2  Sclerae unicteric, EOMs intact Wearing a mask No cervical or supraclavicular adenopathy Lungs no rales or rhonchi Heart regular rate and rhythm Abd soft, nontender, positive bowel sounds MSK no focal spinal tenderness, no upper extremity lymphedema Neuro: nonfocal, well oriented, appropriate affect Breasts: Deferred Skin: Suzanne [name] now has some erythematous and scaly areas which are strongly suggestive to me of psoriasis.  I do not think what I am seeing is a yeast infection.  I did suggest she continue the antiyeast medication she is using but basically I think we need to have her see a dermatologist for better evaluation.     LAB RESULTS:  CMP     Component Value Date/Time   NA 137 07/25/2020 1458   K 4.2 07/25/2020 1458   CL 102 07/25/2020 1458   CO2 29 07/25/2020 1458   GLUCOSE 113 (H) 07/25/2020 1458   BUN 16 07/25/2020 1458   CREATININE 1.04 (H) 07/25/2020 1458   CREATININE 0.93 04/07/2020 1216   CALCIUM 9.5 07/25/2020 1458   PROT 7.1 07/25/2020 1458   ALBUMIN 3.4 (L) 07/25/2020 1458   AST 18 07/25/2020 1458   AST 16 04/07/2020 1216   ALT 9 07/25/2020 1458   ALT 6 04/07/2020 1216   ALKPHOS 126 07/25/2020 1458   BILITOT 0.4 07/25/2020 1458   BILITOT 0.5 04/07/2020 1216   GFRNONAA >60 07/25/2020 1458   GFRNONAA >60 04/07/2020 1216   GFRAA >60 02/24/2020 1454   GFRAA >60 07/24/2019 1453    No results found for: TOTALPROTELP, ALBUMINELP, A1GS, A2GS, BETS, BETA2SER, GAMS, MSPIKE, SPEI  No results found for: KPAFRELGTCHN, LAMBDASER, KAPLAMBRATIO  Lab Results  Component Value Date   WBC 5.0 07/25/2020   NEUTROABS 3.9 07/25/2020   HGB 7.6 (L) 07/25/2020   HCT 25.3  (L) 07/25/2020   MCV 97.7 07/25/2020   PLT 358 07/25/2020   No results  found for: LABCA2  No components found for: XLKGMW102  No results for input(s): INR in the last 168 hours.  No results found for: LABCA2  No results found for: VOZ366  No results found for: CAN125  No results found for: YQI347  Lab Results  Component Value Date   CA2729 21.0 06/23/2020    No components found for: HGQUANT  No results found for: CEA1 / No results found for: CEA1   No results found for: AFPTUMOR  No results found for: CHROMOGRNA  No results found for: HGBA, HGBA2QUANT, HGBFQUANT, HGBSQUAN (Hemoglobinopathy evaluation)   Lab Results  Component Value Date   LDH 215 (H) 07/07/2020    Lab Results  Component Value Date   IRON 40 (L) 07/07/2020   TIBC 448 (H) 07/07/2020   IRONPCTSAT 9 (L) 07/07/2020   (Iron and TIBC)  Lab Results  Component Value Date   FERRITIN 21 07/07/2020    Urinalysis    Component Value Date/Time   COLORURINE YELLOW 04/07/2020 [place] 04/07/2020 1216   LABSPEC 1.026 04/07/2020 1216   PHURINE 5.0 04/07/2020 1216   GLUCOSEU NEGATIVE 04/07/2020 1216   HGBUR NEGATIVE 04/07/2020 1216   BILIRUBINUR NEGATIVE 04/07/2020 [place] 04/07/2020 1216   PROTEINUR NEGATIVE 04/07/2020 1216   NITRITE NEGATIVE 04/07/2020 1216   LEUKOCYTESUR SMALL (A) 04/07/2020 1216    STUDIES:  No results found.   ELIGIBLE FOR AVAILABLE RESEARCH PROTOCOL: no   ASSESSMENT: 60 y.o. [place], [place] woman presenting 10/26/2018 with right-sided inflammatory breast cancer, stage IV, involvnh lungs, lymph nodes and bones, as follows:  (a) chest CT scan 10/26/2018 shows bilateral pleural effusions, possible lymphangitic spread of tumor, diffuse bony metastatic disease, and significant axillary mediastinal and hilar adenopathy  (b) bone scan 10/27/2018 is a "near [place]" consistent with widespread bony metastatic disease  (c) head CT  with and without contrast 10/30/2018 shows no intracranial metastatic disease, multiple calvarial lesions  (d) CA-27-29 on 10/27/2018 was 1810.4  (1) pleural fluid from right thoracentesis 10/28/2018 confirms malignant cells consistent with a breast primary, strongly estrogen and progesterone receptor positive, HER-2 not amplified, with an MIB-1 of 2%  (2) anastrozole started 10/29/2018  (a) palbociclib to start 11/13/2018 at 100 mg dose  (b) denosumab/xgeva started 11/13/2018   (3) associated problems:  (a) hypoxia secondary to effusions  (b) pain from bone lesions and left humeral and vertebral compression fractures  (c) right upper extremity lymphedema  (d) anemia, requiring transfusion  (e) poor venous access  (4) genetics testing on 12/25/2018 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 STK11, TP53, TSC1, TSC2, and VHL.  The following genes were evaluated for sequence changes only: SDHA and HOXB13 c.251G>A variant only. The [facility] Melanoma Panel analyzed the following 9 genes: BAP1 BRCA2 CDK4 CDKN2A MITF POT1 PTEN RB1 Tp53.   (5) palliative radiation to the left humerus 07/20/2019 - 07/31/2019 Site Technique Total Dose (Gy) Dose per Fx (Gy) Completed Fx Beam Energies  Humerus, Left: Ext_Lt_humerus Complex 30/30 3 10/10 6X   (6) anemia requiring transfusion: Work-up in  progress   PLAN: Suzanne [name] is just about 2 years out from definitive diagnosis of metastatic breast cancer.  Her  disease appears to be very well controlled.  She is tolerating treatment generally well except for the persistent anemia issues.  She does have a very unusual skin rash.  To me it looks like psoriasis.  It does not look like a yeast infection.  It is rather disseminated.  I think it would be helpful if she were evaluated by dermatology and we will try to obtain that appointment for her as soon as possible.  Right now she is asymptomatic although her hemoglobin is less than 8.  Has an injection appointment on 0303 and I will go ahead and plan to transfuse her the same day.  She knows to call for any other issue that may develop before the next visit  Total encounter time 30 minutes.Sarajane Jews C. , [name] 07/25/20 7:12 PM Medical Oncology and Hematology [facility] [place], [place] [address] Tel. [phone]    Fax. [phone]   I, Wilburn Mylar, am acting as scribe for Dr. Virgie [name]. .  I, Lurline Del [name], have reviewed the above documentation for accuracy and completeness, and I agree with the above.   *Total Encounter Time as defined by the Centers for Medicare and Medicaid Services includes, in addition to the face-to-face time of a patient visit (documented in the note above) non-face-to-face time: obtaining and reviewing outside history, ordering and reviewing medications, tests or procedures, care coordination (communications with other health care professionals or caregivers) and documentation in the medical record.

## 2020-07-25 ENCOUNTER — Inpatient Hospital Stay (HOSPITAL_BASED_OUTPATIENT_CLINIC_OR_DEPARTMENT_OTHER): Payer: Medicaid Other | Admitting: Oncology

## 2020-07-25 ENCOUNTER — Inpatient Hospital Stay: Payer: Medicaid Other

## 2020-07-25 ENCOUNTER — Other Ambulatory Visit: Payer: Self-pay

## 2020-07-25 ENCOUNTER — Other Ambulatory Visit: Payer: Self-pay | Admitting: Oncology

## 2020-07-25 VITALS — BP 135/77 | HR 89 | Temp 97.7°F | Resp 18 | Ht 59.0 in | Wt 201.8 lb

## 2020-07-25 DIAGNOSIS — C50811 Malignant neoplasm of overlapping sites of right female breast: Secondary | ICD-10-CM

## 2020-07-25 DIAGNOSIS — J90 Pleural effusion, not elsewhere classified: Secondary | ICD-10-CM

## 2020-07-25 DIAGNOSIS — R0602 Shortness of breath: Secondary | ICD-10-CM

## 2020-07-25 DIAGNOSIS — D649 Anemia, unspecified: Secondary | ICD-10-CM

## 2020-07-25 DIAGNOSIS — C7951 Secondary malignant neoplasm of bone: Secondary | ICD-10-CM

## 2020-07-25 DIAGNOSIS — C50911 Malignant neoplasm of unspecified site of right female breast: Secondary | ICD-10-CM | POA: Diagnosis not present

## 2020-07-25 DIAGNOSIS — C7801 Secondary malignant neoplasm of right lung: Secondary | ICD-10-CM

## 2020-07-25 DIAGNOSIS — Z170 Estrogen receptor positive status [ER+]: Secondary | ICD-10-CM | POA: Diagnosis not present

## 2020-07-25 DIAGNOSIS — G893 Neoplasm related pain (acute) (chronic): Secondary | ICD-10-CM

## 2020-07-25 DIAGNOSIS — C799 Secondary malignant neoplasm of unspecified site: Secondary | ICD-10-CM

## 2020-07-25 LAB — COMPREHENSIVE METABOLIC PANEL
ALT: 9 U/L (ref 0–44)
AST: 18 U/L (ref 15–41)
Albumin: 3.4 g/dL — ABNORMAL LOW (ref 3.5–5.0)
Alkaline Phosphatase: 126 U/L (ref 38–126)
Anion gap: 6 (ref 5–15)
BUN: 16 mg/dL (ref 8–23)
CO2: 29 mmol/L (ref 22–32)
Calcium: 9.5 mg/dL (ref 8.9–10.3)
Chloride: 102 mmol/L (ref 98–111)
Creatinine, Ser: 1.04 mg/dL — ABNORMAL HIGH (ref 0.44–1.00)
GFR, Estimated: >60 mL/min (ref >60)
Glucose, Bld: 113 mg/dL — ABNORMAL HIGH (ref 70–99)
Potassium: 4.2 mmol/L (ref 3.5–5.1)
Sodium: 137 mmol/L (ref 135–145)
Total Bilirubin: 0.4 mg/dL (ref 0.3–1.2)
Total Protein: 7.1 g/dL (ref 6.5–8.1)

## 2020-07-25 LAB — CBC WITH DIFFERENTIAL/PLATELET
Abs Immature Granulocytes: 0.05 10*3/uL (ref 0.00–0.07)
Basophils Absolute: 0.1 10*3/uL (ref 0.0–0.1)
Basophils Relative: 1 %
Eosinophils Absolute: 0.2 10*3/uL (ref 0.0–0.5)
Eosinophils Relative: 3 %
HCT: 25.3 % — ABNORMAL LOW (ref 36.0–46.0)
Hemoglobin: 7.6 g/dL — ABNORMAL LOW (ref 12.0–15.0)
Immature Granulocytes: 1 %
Lymphocytes Relative: 13 %
Lymphs Abs: 0.7 10*3/uL (ref 0.7–4.0)
MCH: 29.3 pg (ref 26.0–34.0)
MCHC: 30 g/dL (ref 30.0–36.0)
MCV: 97.7 fL (ref 80.0–100.0)
Monocytes Absolute: 0.2 10*3/uL (ref 0.1–1.0)
Monocytes Relative: 3 %
Neutro Abs: 3.9 10*3/uL (ref 1.7–7.7)
Neutrophils Relative %: 79 %
Platelets: 358 10*3/uL (ref 150–400)
RBC: 2.59 MIL/uL — ABNORMAL LOW (ref 3.87–5.11)
RDW: 19.3 % — ABNORMAL HIGH (ref 11.5–15.5)
WBC: 5 10*3/uL (ref 4.0–10.5)
nRBC: 0 % (ref 0.0–0.2)

## 2020-07-25 LAB — TYPE AND SCREEN
ABO/RH(D): O POS
Antibody Screen: NEGATIVE

## 2020-07-26 ENCOUNTER — Telehealth: Payer: Self-pay

## 2020-07-26 ENCOUNTER — Telehealth: Payer: Self-pay | Admitting: Oncology

## 2020-07-26 ENCOUNTER — Other Ambulatory Visit: Payer: Self-pay | Admitting: *Deleted

## 2020-07-26 ENCOUNTER — Other Ambulatory Visit: Payer: Self-pay

## 2020-07-26 DIAGNOSIS — C50811 Malignant neoplasm of overlapping sites of right female breast: Secondary | ICD-10-CM | POA: Diagnosis not present

## 2020-07-26 DIAGNOSIS — S22000A Wedge compression fracture of unspecified thoracic vertebra, initial encounter for closed fracture: Secondary | ICD-10-CM | POA: Diagnosis not present

## 2020-07-26 DIAGNOSIS — Z170 Estrogen receptor positive status [ER+]: Secondary | ICD-10-CM

## 2020-07-26 DIAGNOSIS — C7801 Secondary malignant neoplasm of right lung: Secondary | ICD-10-CM | POA: Diagnosis not present

## 2020-07-26 DIAGNOSIS — R0602 Shortness of breath: Secondary | ICD-10-CM | POA: Diagnosis not present

## 2020-07-26 DIAGNOSIS — J9621 Acute and chronic respiratory failure with hypoxia: Secondary | ICD-10-CM | POA: Diagnosis not present

## 2020-07-26 LAB — CANCER ANTIGEN 27.29: CA 27.29: 27.8 U/mL (ref 0.0–38.6)

## 2020-07-26 NOTE — Patient Instructions (Signed)
Visit Information  Suzanne [name] was given information about Medicaid Managed Care team care coordination services as a part of their [place] Medicaid benefit. Suzanne [name] verbally consented to engagement with the [facility] Managed Care team.   For questions related to your [facility], please call: [phone] or visit the homepage here: [email or web address]  If you would like to schedule transportation through your [facility], please call the following number at least 2 days in advance of your appointment: [phone].   Suzanne [name] - following are the goals we discussed in your visit today:  Goals Addressed            This Visit's Progress   . Find Help in My Community       Timeframe:  Long-Range Goal Priority:  High Start Date:  07/14/20                           Expected End Date:  09/19/20                   Follow Up Date 08/17/20   - begin a notebook of services in my neighborhood or community - call 211 when I need some help - follow-up on any referrals for help I am given - have a back-up plan - make a list of family or friends that I can call  - call local [employer] [phone] company for DME needs   Why is this important?    Knowing how and where to find help for yourself or family in your neighborhood and community is an important skill.   You will want to take some steps to learn how.           Please see education materials related to healthy diet provided by MyChart link.    Healthy Eating Following a healthy eating pattern may help you to achieve and maintain a healthy body weight, reduce the risk of chronic disease, and live a long and productive life. It is important to follow a healthy eating pattern at an appropriate calorie level for your body. Your nutritional needs should be met primarily through food by choosing a variety of  nutrient-rich foods. What are tips for following this plan? Reading food labels  Read labels and choose the following: ? Reduced or low sodium. ? Juices with 100% fruit juice. ? Foods with low saturated fats and high polyunsaturated and monounsaturated fats. ? Foods with whole grains, such as whole wheat, cracked wheat, brown rice, and wild rice. ? Whole grains that are fortified with folic acid. This is recommended for women who are pregnant or who want to become pregnant.  Read labels and avoid the following: ? Foods with a lot of added sugars. These include foods that contain brown sugar, corn sweetener, corn syrup, dextrose, fructose, glucose, high-fructose corn syrup, honey, invert sugar, lactose, malt syrup, maltose, molasses, raw sugar, sucrose, trehalose, or turbinado sugar.  Do not eat more than the following amounts of added sugar per day:  6 teaspoons (25 g) for women.  9 teaspoons (38 g) for men. ? Foods that contain processed or refined starches and grains. ? Refined grain products, such as white flour, degermed cornmeal, white bread, and white rice. Shopping  Choose nutrient-rich snacks, such as vegetables, whole fruits, and nuts. Avoid high-calorie and high-sugar snacks, such as potato chips, fruit snacks, and candy.  Use oil-based dressings and  spreads on foods instead of solid fats such as butter, stick margarine, or cream cheese.  Limit pre-made sauces, mixes, and "instant" products such as flavored rice, instant noodles, and ready-made pasta.  Try more plant-protein sources, such as tofu, tempeh, black beans, edamame, lentils, nuts, and seeds.  Explore eating plans such as the Mediterranean diet or vegetarian diet. Cooking  Use oil to saut or stir-fry foods instead of solid fats such as butter, stick margarine, or lard.  Try baking, boiling, grilling, or broiling instead of frying.  Remove the fatty part of meats before cooking.  Steam vegetables in water  or broth. Meal planning  At meals, imagine dividing your plate into fourths: ? One-half of your plate is fruits and vegetables. ? One-fourth of your plate is whole grains. ? One-fourth of your plate is protein, especially lean meats, poultry, eggs, tofu, beans, or nuts.  Include low-fat dairy as part of your daily diet.   Lifestyle  Choose healthy options in all settings, including home, work, school, restaurants, or stores.  Prepare your food safely: ? Wash your hands after handling raw meats. ? Keep food preparation surfaces clean by regularly washing with hot, soapy water. ? Keep raw meats separate from ready-to-eat foods, such as fruits and vegetables. ? Cook seafood, meat, poultry, and eggs to the recommended internal temperature. ? Store foods at safe temperatures. In general:  Keep cold foods at 50F (4.4C) or below.  Keep hot foods at 150F (60C) or above.  Keep your freezer at [facility] (-17.8C) or below.  Foods are no longer safe to eat when they have been between the temperatures of 40-150F (4.4-60C) for more than 2 hours. What foods should I eat? Fruits Aim to eat 2 cup-equivalents of fresh, canned (in natural juice), or frozen fruits each day. Examples of 1 cup-equivalent of fruit include 1 small apple, 8 large strawberries, 1 cup canned fruit,  cup dried fruit, or 1 cup 100% juice. Vegetables Aim to eat 2-3 cup-equivalents of fresh and frozen vegetables each day, including different varieties and colors. Examples of 1 cup-equivalent of vegetables include 2 medium carrots, 2 cups raw, leafy greens, 1 cup chopped vegetable (raw or cooked), or 1 medium baked potato. Grains Aim to eat 6 ounce-equivalents of whole grains each day. Examples of 1 ounce-equivalent of grains include 1 slice of bread, 1 cup ready-to-eat cereal, 3 cups popcorn, or  cup cooked rice, pasta, or cereal. Meats and other proteins Aim to eat 5-6 ounce-equivalents of protein each day. Examples of 1  ounce-equivalent of protein include 1 egg, 1/2 cup nuts or seeds, or 1 tablespoon (16 g) peanut butter. A cut of meat or fish that is the size of a deck of cards is about 3-4 ounce-equivalents.  Of the protein you eat each week, try to have at least 8 ounces come from seafood. This includes salmon, trout, herring, and anchovies. Dairy Aim to eat 3 cup-equivalents of fat-free or low-fat dairy each day. Examples of 1 cup-equivalent of dairy include 1 cup (240 mL) milk, 8 ounces (250 g) yogurt, 1 ounces (44 g) natural cheese, or 1 cup (240 mL) fortified soy milk. Fats and oils  Aim for about 5 teaspoons (21 g) per day. Choose monounsaturated fats, such as canola and olive oils, avocados, peanut butter, and most nuts, or polyunsaturated fats, such as sunflower, corn, and soybean oils, walnuts, pine nuts, sesame seeds, sunflower seeds, and flaxseed. Beverages  Aim for six 8-oz glasses of water per day. Limit coffee  to three to five 8-oz cups per day.  Limit caffeinated beverages that have added calories, such as soda and energy drinks.  Limit alcohol intake to no more than 1 drink a day for nonpregnant women and 2 drinks a day for men. One drink equals 12 oz of beer (355 mL), 5 oz of wine (148 mL), or 1 oz of hard liquor (44 mL). Seasoning and other foods  Avoid adding excess amounts of salt to your foods. Try flavoring foods with herbs and spices instead of salt.  Avoid adding sugar to foods.  Try using oil-based dressings, sauces, and spreads instead of solid fats. This information is based on general [place] nutrition guidelines. For more information, visit BuildDNA.es. Exact amounts may vary based on your nutrition needs. Summary  A healthy eating plan may help you to maintain a healthy weight, reduce the risk of chronic diseases, and stay active throughout your life.  Plan your meals. Make sure you eat the right portions of a variety of nutrient-rich foods.  Try baking, boiling,  grilling, or broiling instead of frying.  Choose healthy options in all settings, including home, work, school, restaurants, or stores. This information is not intended to replace advice given to you by your health care provider. Make sure you discuss any questions you have with your health care provider. Document Revised: 08/26/2017 Document Reviewed: 08/26/2017 Elsevier Patient Education  2021 [place].   Patient verbalizes understanding of instructions provided today.   Telephone follow up appointment with Managed Medicaid care management team member scheduled for:08/17/20  Melissa Montane, RN  Following is a copy of your plan of care:  Patient Care Plan: General Plan of Care (Adult)    Problem Identified: Health Promotion or Disease Self-Management (General Plan of Care)     Long-Range Goal: Self-Management Plan Developed   Start Date: 07/14/2020  Expected End Date: 09/19/2020  Recent Progress: On track  Priority: High  Note:   Current Barriers:  . Care Coordination needs related to managing breast cancer treatment. Suzanne [name] is being treated for stage IV breast cancer. She and her daughter live together. Suzanne [name] is unable to drive and her daughter does not drive. This makes it difficult to get groceries and OTC medications. They have had groceries delivered, which is very expensive after paying the extra fees. Suzanne [name] is able to walk around in the house, but requires a wheelchair when going to the [place] for appointments. Her wheelchair is missing a screw on the leg support and the brake is not working correctly. They have called [employer] for assistance with fixing the wheelchair and were told they would need to bring the wheelchair to the office, which is in [facility]. RNCM following up on wheelchair. Patient has not been contacted by [employer].  . Film/video editor.  . Transportation barriers . Difficulty obtaining medications . Food Insecurities  Nurse Case Manager  Clinical Goal(s):  . patient will work with Care Guide to address needs related to procuring groceries and assistance applying for food benefits . patient will meet with RN Care Manager to address barriers to managing her healthcare at home . patient will work with CM team pharmacist for medication management  Interventions:  . Inter-disciplinary care team collaboration (see longitudinal plan of care) . Evaluation of current treatment plan related to anemia and patient's adherence to plan as established by provider. . Reviewed medications with patient and discussed difficulty obtaining OTC medications . Discussed plans with patient for ongoing care  management follow up and provided patient with direct contact information for care management team . Provided patient with MyChart educational materials related to anemia . Care Guide referral for food insecurities . Pharmacy referral for medication management . Follow up with [employer] for repair of wheelchair- RNCM calling [employer] for the 3rd time for repair/replacement of wheelchair. Spoke with Ivin Booty who will directly follow up with Suzanne [name] for measurements of wheelchair and will send a new wheelchair. . BSW spoke with patient. BSW provided patient with the website epass.uMourn.cz to apply for foodstamps online and the phone number for Aging and Disability Services [phone].  Patient Goals/Self-Care Activities - begin a notebook of services in my neighborhood or community - call 211 when I need some help - follow-up on any referrals for help I am given - have a back-up plan - make a list of family or friends that I can call  - call [phone] for medical transportation provided by [facility] - call to cancel if needed - keep a calendar with prescription refill dates - keep a calendar with appointment dates   Follow Up Plan: Telephone follow up appointment with Managed Medicaid care management team member scheduled for:08/17/20 @  2:30pm      Patient Care Plan: Medication Management    Problem Identified: Health Promotion or Disease Self-Management (General Plan of Care)     Goal: Medication Management   Note:   Evidence-based guidance:   Review biopsychosocial determinants of health screens.   Determine level of modifiable health risk.   Assess level of patient activation, level of readiness, importance and confidence to make changes.   Evoke change talk using open-ended questions, pros and cons, as well as looking forward.   Identify areas where behavior change may lead to improved health.   Partner with patient to develop a robust self-management plan that includes lifestyle factors, such as weight loss, exercise and healthy nutrition, as well as goals specific to disease risks.   Support patient and family/caregiver active participation in decision-making and self-management plan.   Implement additional goals and interventions based on identified risk factors to reduce health risk.   Facilitate advance care planning.   Review need for preventive screening based on age, sex, family history and health history.   Notes:

## 2020-07-26 NOTE — Telephone Encounter (Signed)
Contacted pt to verify upcoming appointments. Pt and daughter verbalized understanding.

## 2020-07-26 NOTE — Patient Outreach (Signed)
Medicaid Managed Care   Nurse Care Manager Note  07/26/2020 Name:  Suzanne [name] MRN:  [id number] DOB:  23-May-1961  Suzanne [name] is an 60 y.o. year old female who is a primary patient of Suzanne Pounds, NP.  The [facility] Managed Care Coordination team was consulted for assistance with:    breast cancer  Suzanne [name] was given information about Medicaid Managed Care Coordination team services today. Suzanne [name] agreed to services and verbal consent obtained.  Engaged with patient by telephone for follow up visit in response to provider referral for case management and/or care coordination services.   Assessments/Interventions:  Review of past medical history, allergies, medications, health status, including review of consultants reports, laboratory and other test data, was performed as part of comprehensive evaluation and provision of chronic care management services.  SDOH (Social Determinants of Health) assessments and interventions performed:   Care Plan  No Known Allergies  Medications Reviewed Today    Reviewed by Lane Hacker, [facility] (Pharmacist) on 07/19/20 at 1617  Med List Status: <None>  Medication Order Taking? Sig Documenting Provider Last Dose Status Informant  anastrozole (ARIMIDEX) 1 MG tablet [id number] Yes Take 1 tablet (1 mg total) by mouth daily. [name], Suzanne Dad, MD Taking Active   antiseptic oral rinse Charlene Brooke) LIQD [id number] No 15 mLs by Mouth Rinse route as needed for dry mouth.  Patient not taking: No sig reported   [provider] Not Taking Consider Medication Status and Discontinue   Blood Pressure Monitor MISC [id number] Yes Use to check blood pressure twice per day and more often if not feeling well Fulp, Cammie, MD Taking Active   calcium-vitamin D (OSCAL WITH D) 500-200 MG-UNIT tablet [id number] Yes Take 1 tablet by mouth 3 (three) times daily. [name], Suzanne Emerald, MD Taking Active Family Member  cetirizine (ZYRTEC) 10 MG tablet [id number] Yes  Take 1 tablet (10 mg total) by mouth daily. Suzanne Pounds, NP Taking Active   famotidine (PEPCID) 20 MG tablet [id number] Yes Take 1 tablet (20 mg total) by mouth 2 (two) times daily as needed for heartburn or indigestion. [name], Suzanne Dad, MD Taking Active   furosemide (LASIX) 20 MG tablet [id number] Yes TAKE 1 TABLET(20 MG) BY MOUTH DAILY [name], Suzanne Dad, MD Taking Active   ketoconazole (NIZORAL) 2 % cream [id number] Yes Apply to affected areas daily as needed [name], Suzanne Dad, MD Taking Active   MELATONIN PO [id number] Yes Take by mouth. [provider] Taking Active   methocarbamol (ROBAXIN) 500 MG tablet [id number] Yes Take 500 mg by mouth every 6 (six) hours as needed. [provider] Taking Active   nystatin (MYCOSTATIN/NYSTOP) powder [id number] Yes Apply 1 application topically as needed. [provider] Taking Active   palbociclib Suzanne [name]) 75 MG tablet [id number] Yes Take 1 tablet (75 mg total) by mouth daily. TAKE 1 TABLET (100 MG TOTAL) BY MOUTH DAILY. TAKE FOR 21 DAYS ON, 7 DAYS OFF, REPEAT EVERY 28 DAYS. [name], Suzanne Dad, MD Taking Active            Med Note [name]   Fri Jul 08, 2020  3:28 PM) Told to restart on Monday by Oncologist  polyethylene glycol (MIRALAX / GLYCOLAX) 17 g packet [id number] Yes Take 17 g by mouth as needed for constipation. [provider] Taking Active Family Member  sertraline (ZOLOFT) 50 MG tablet [id number] Yes Take 1.5 tablets (75 mg total) by mouth at bedtime. 90 day supply is 135 tablets Suzanne [name],  Suzanne Buff, NP Taking Active   ZOFRAN 8 MG tablet [id number] Yes Take 8 mg by mouth every 8 (eight) hours. [provider] Taking Active   Med List Note Suzanne [name], RPH-CPP 11/13/19 4098): Suzanne [name] filled at [place]          Patient Active Problem List   Diagnosis Date Noted  . Intractable back pain 02/03/2019  . Thoracic compression fracture, closed, initial encounter ([place])  02/03/2019  . Essential hypertension 02/03/2019  . Genetic testing 01/27/2019  . Acute pharyngitis   . Bacteremia   . FTT (failure to thrive) in adult   . Fracture closed, humerus, shaft 01/09/2019  . Pneumonia 12/25/2018  . Acute respiratory disorder in immunocompromised patient ([place]) 12/25/2018  . Family history of breast cancer   . Family history of prostate cancer   . Family history of melanoma   . Family history of colon cancer   . Pressure injury of skin 11/24/2018  . Chronic respiratory failure with hypoxia and hypercapnia ([place]) 11/19/2018  . 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 Hypophosphatemia 11/19/2018  . Hypocalcemia 11/19/2018  . Aspiration pneumonia ([place]) 11/19/2018  . SOB (shortness of breath)   . Palliative care by specialist   . Carcinoma of breast, estrogen receptor positive, stage 4, right ([place]) 11/16/2018  . Hypoalbuminemia 11/16/2018  . Pleural effusion 11/14/2018  . Goals of care, counseling/discussion 11/06/2018  . Malignant neoplasm of overlapping sites of right breast in female, estrogen receptor positive ([place]) 10/31/2018  . Lung metastasis ([place]) 10/31/2018  . Pain from bone metastases ([place]) 10/31/2018  . Morbid obesity with BMI of 40.0-44.9, adult ([place]) 10/31/2018  . Bone injury   . Pleural effusion, bilateral   . Shortness of breath   . Abnormal breast finding   . Hypokalemia   . Breast skin changes   . Macrocytic anemia   . Bone metastasis ([place])   . Anemia, B12 deficiency 10/26/2018    Conditions to be addressed/monitored per PCP order:  breast cancer  Care Plan : General Plan of Care (Adult)  Updates made by Suzanne Montane, RN since 07/26/2020 12:00 AM    Problem: Health Promotion or Disease Self-Management (General Plan of Care)     Long-Range Goal: Self-Management Plan Developed   Start Date: 07/14/2020  Expected End Date: 09/19/2020  Recent Progress: On track  Priority: High  Note:   Current Barriers:  . Care Coordination needs  related to managing breast cancer treatment. Ms. Bandel is being treated for stage IV breast cancer. She and her daughter live together. Ms. Brunkhorst is unable to drive and her daughter does not drive. This makes it difficult to get groceries and OTC medications. They have had groceries delivered, which is very expensive after paying the extra fees. Ms Gossard is able to walk around in the house, but requires a wheelchair when going to the [place] for appointments. Her wheelchair is missing a screw on the leg support and the brake is not working correctly. They have called [employer] for assistance with fixing the wheelchair and were told they would need to bring the wheelchair to the office, which is in [facility]. RNCM following up on wheelchair. Patient has not been contacted by [employer].  . Film/video editor.  . Transportation barriers . Difficulty obtaining medications . Food Insecurities  Nurse Case Manager Clinical Goal(s):  . patient will work with Care Guide to address needs related to procuring groceries and assistance applying for food benefits . patient will  meet with RN Care Manager to address barriers to managing her healthcare at [name] . patient will work with CM team pharmacist for medication management  Interventions:  . Inter-disciplinary care team collaboration (see longitudinal plan of care) . Evaluation of current treatment plan related to anemia and patient's adherence to plan as established by provider. . Reviewed medications with patient and discussed difficulty obtaining OTC medications . Discussed plans with patient for ongoing care management follow up and provided patient with direct contact information for care management team . Provided patient with MyChart educational materials related to anemia . Care Guide referral for food insecurities . Pharmacy referral for medication management . Follow up with [employer] for repair of wheelchair- RNCM calling [employer] for the 3rd time  for repair/replacement of wheelchair. Spoke with Ivin Booty who will directly follow up with Ms. Scarpati for measurements of wheelchair and will send a new wheelchair. . BSW spoke with patient. BSW provided patient with the website epass.uMourn.cz to apply for foodstamps online and the phone number for Aging and Disability Services [phone].  Patient Goals/Self-Care Activities - begin a notebook of services in my neighborhood or community - call 211 when I need some help - follow-up on any referrals for help I am given - have a back-up plan - make a list of family or friends that I can call  - call [phone] for medical transportation provided by [facility] - call to cancel if needed - keep a calendar with prescription refill dates - keep a calendar with appointment dates   Follow Up Plan: Telephone follow up appointment with Managed Medicaid care management team member scheduled for:08/17/20 @ 2:30pm        Follow Up:  Patient agrees to Care Plan and Follow-up.  Plan: The Managed Medicaid care management team will reach out to the patient again over the next 30 days.  Date/time of next scheduled RN care management/care coordination outreach: 08/17/20 @ 1pm  [name] RN, [place] RN Care Coordinator

## 2020-07-26 NOTE — Telephone Encounter (Signed)
Scheduled appts per 2/28 los. Pt's daughter confirmed appt dates and times.

## 2020-07-27 DIAGNOSIS — C50811 Malignant neoplasm of overlapping sites of right female breast: Secondary | ICD-10-CM | POA: Diagnosis not present

## 2020-07-28 ENCOUNTER — Ambulatory Visit: Payer: Medicaid Other

## 2020-07-28 ENCOUNTER — Inpatient Hospital Stay: Payer: Medicaid Other

## 2020-07-28 ENCOUNTER — Other Ambulatory Visit: Payer: Self-pay | Admitting: Oncology

## 2020-07-28 ENCOUNTER — Other Ambulatory Visit: Payer: Self-pay

## 2020-07-28 ENCOUNTER — Inpatient Hospital Stay: Payer: Medicaid Other | Attending: Adult Health

## 2020-07-28 VITALS — BP 112/82 | HR 89 | Temp 98.6°F | Resp 20

## 2020-07-28 DIAGNOSIS — Z79899 Other long term (current) drug therapy: Secondary | ICD-10-CM | POA: Diagnosis not present

## 2020-07-28 DIAGNOSIS — E538 Deficiency of other specified B group vitamins: Secondary | ICD-10-CM | POA: Diagnosis not present

## 2020-07-28 DIAGNOSIS — D649 Anemia, unspecified: Secondary | ICD-10-CM | POA: Insufficient documentation

## 2020-07-28 DIAGNOSIS — C7801 Secondary malignant neoplasm of right lung: Secondary | ICD-10-CM

## 2020-07-28 DIAGNOSIS — Z170 Estrogen receptor positive status [ER+]: Secondary | ICD-10-CM

## 2020-07-28 DIAGNOSIS — C7951 Secondary malignant neoplasm of bone: Secondary | ICD-10-CM | POA: Insufficient documentation

## 2020-07-28 DIAGNOSIS — C50811 Malignant neoplasm of overlapping sites of right female breast: Secondary | ICD-10-CM

## 2020-07-28 DIAGNOSIS — C799 Secondary malignant neoplasm of unspecified site: Secondary | ICD-10-CM

## 2020-07-28 DIAGNOSIS — J90 Pleural effusion, not elsewhere classified: Secondary | ICD-10-CM

## 2020-07-28 DIAGNOSIS — G893 Neoplasm related pain (acute) (chronic): Secondary | ICD-10-CM

## 2020-07-28 DIAGNOSIS — Z6841 Body Mass Index (BMI) 40.0 and over, adult: Secondary | ICD-10-CM

## 2020-07-28 DIAGNOSIS — C50911 Malignant neoplasm of unspecified site of right female breast: Secondary | ICD-10-CM | POA: Diagnosis present

## 2020-07-28 DIAGNOSIS — R0602 Shortness of breath: Secondary | ICD-10-CM

## 2020-07-28 LAB — CBC WITH DIFFERENTIAL/PLATELET
Abs Immature Granulocytes: 0.01 10*3/uL (ref 0.00–0.07)
Basophils Absolute: 0 10*3/uL (ref 0.0–0.1)
Basophils Relative: 1 %
Eosinophils Absolute: 0.2 10*3/uL (ref 0.0–0.5)
Eosinophils Relative: 4 %
HCT: 25.1 % — ABNORMAL LOW (ref 36.0–46.0)
Hemoglobin: 7.7 g/dL — ABNORMAL LOW (ref 12.0–15.0)
Immature Granulocytes: 0 %
Lymphocytes Relative: 19 %
Lymphs Abs: 0.7 10*3/uL (ref 0.7–4.0)
MCH: 30.1 pg (ref 26.0–34.0)
MCHC: 30.7 g/dL (ref 30.0–36.0)
MCV: 98 fL (ref 80.0–100.0)
Monocytes Absolute: 0.2 10*3/uL (ref 0.1–1.0)
Monocytes Relative: 5 %
Neutro Abs: 2.5 10*3/uL (ref 1.7–7.7)
Neutrophils Relative %: 71 %
Platelets: 297 10*3/uL (ref 150–400)
RBC: 2.56 MIL/uL — ABNORMAL LOW (ref 3.87–5.11)
RDW: 20.1 % — ABNORMAL HIGH (ref 11.5–15.5)
WBC: 3.6 10*3/uL — ABNORMAL LOW (ref 4.0–10.5)
nRBC: 0 % (ref 0.0–0.2)

## 2020-07-28 LAB — COMPREHENSIVE METABOLIC PANEL
ALT: 9 U/L (ref 0–44)
AST: 17 U/L (ref 15–41)
Albumin: 3.5 g/dL (ref 3.5–5.0)
Alkaline Phosphatase: 120 U/L (ref 38–126)
Anion gap: 9 (ref 5–15)
BUN: 15 mg/dL (ref 8–23)
CO2: 28 mmol/L (ref 22–32)
Calcium: 8.6 mg/dL — ABNORMAL LOW (ref 8.9–10.3)
Chloride: 103 mmol/L (ref 98–111)
Creatinine, Ser: 1.04 mg/dL — ABNORMAL HIGH (ref 0.44–1.00)
GFR, Estimated: >60 mL/min (ref >60)
Glucose, Bld: 100 mg/dL — ABNORMAL HIGH (ref 70–99)
Potassium: 3.6 mmol/L (ref 3.5–5.1)
Sodium: 140 mmol/L (ref 135–145)
Total Bilirubin: 0.3 mg/dL (ref 0.3–1.2)
Total Protein: 7 g/dL (ref 6.5–8.1)

## 2020-07-28 MED ORDER — POLYETHYLENE GLYCOL 3350 17 G PO PACK: 17.0000 g | PACK | ORAL | 6 refills | Status: DC | PRN

## 2020-07-28 MED ORDER — FAMOTIDINE 20 MG PO TABS: 20.0000 mg | ORAL_TABLET | Freq: Two times a day (BID) | ORAL | 3 refills | Status: DC | PRN

## 2020-07-28 MED ORDER — CYANOCOBALAMIN 1000 MCG/ML IJ SOLN
1000.0000 ug | Freq: Once | INTRAMUSCULAR | Status: AC
  Administered 2020-07-28: 1000 ug via INTRAMUSCULAR

## 2020-07-28 MED ORDER — CYANOCOBALAMIN 2000 MCG PO TABS: 2000.0000 ug | ORAL_TABLET | Freq: Every day | ORAL | 4 refills | Status: DC

## 2020-07-28 MED ORDER — MELATONIN 1 MG PO CAPS: 1.0000 | ORAL_CAPSULE | ORAL | 4 refills | Status: AC

## 2020-07-28 MED ORDER — DENOSUMAB 120 MG/1.7ML ~~LOC~~ SOLN
SUBCUTANEOUS | Status: AC
  Filled 2020-07-28: qty 1.7

## 2020-07-28 MED ORDER — KETOCONAZOLE 2 % EX CREA: TOPICAL_CREAM | CUTANEOUS | 6 refills | Status: DC

## 2020-07-28 MED ORDER — FERROUS SULFATE 325 (65 FE) MG PO TBEC: 325.0000 mg | DELAYED_RELEASE_TABLET | Freq: Three times a day (TID) | ORAL | 3 refills | Status: DC

## 2020-07-28 MED ORDER — CALCIUM CARBONATE-VITAMIN D 500-200 MG-UNIT PO TABS: 1.0000 | ORAL_TABLET | Freq: Three times a day (TID) | ORAL | 1 refills | Status: DC

## 2020-07-28 MED ORDER — DENOSUMAB 120 MG/1.7ML ~~LOC~~ SOLN
120.0000 mg | Freq: Once | SUBCUTANEOUS | Status: AC
  Administered 2020-07-28: 120 mg via SUBCUTANEOUS

## 2020-07-28 MED ORDER — ANASTROZOLE 1 MG PO TABS: 1.0000 mg | ORAL_TABLET | Freq: Every day | ORAL | 4 refills | Status: DC

## 2020-07-28 MED ORDER — CYANOCOBALAMIN 1000 MCG/ML IJ SOLN
INTRAMUSCULAR | Status: AC
  Filled 2020-07-28: qty 1

## 2020-07-28 MED ORDER — FOLIC ACID 1 MG PO TABS: 1.0000 mg | ORAL_TABLET | Freq: Every day | ORAL | 4 refills | Status: DC

## 2020-07-28 NOTE — Patient Outreach (Signed)
Asked PCP for OTC Scripts

## 2020-07-28 NOTE — Patient Instructions (Signed)
Denosumab injection What is this medicine? DENOSUMAB (den oh sue mab) slows bone breakdown. Prolia is used to treat osteoporosis in women after menopause and in men, and in people who are taking corticosteroids for 6 months or more. Xgeva is used to treat a high calcium level due to cancer and to prevent bone fractures and other bone problems caused by multiple myeloma or cancer bone metastases. Xgeva is also used to treat giant cell tumor of the bone. This medicine may be used for other purposes; ask your health care provider or pharmacist if you have questions. COMMON BRAND NAME(S): Prolia, XGEVA What should I tell my health care provider before I take this medicine? They need to know if you have any of these conditions:  dental disease  having surgery or tooth extraction  infection  kidney disease  low levels of calcium or Vitamin D in the blood  malnutrition  on hemodialysis  skin conditions or sensitivity  thyroid or parathyroid disease  an unusual reaction to denosumab, other medicines, foods, dyes, or preservatives  pregnant or trying to get pregnant  breast-feeding How should I use this medicine? This medicine is for injection under the skin. It is given by a health care professional in a hospital or clinic setting. A special MedGuide will be given to you before each treatment. Be sure to read this information carefully each time. For Prolia, talk to your pediatrician regarding the use of this medicine in children. Special care may be needed. For Xgeva, talk to your pediatrician regarding the use of this medicine in children. While this drug may be prescribed for children as young as 13 years for selected conditions, precautions do apply. Overdosage: If you think you have taken too much of this medicine contact a poison control center or emergency room at once. NOTE: This medicine is only for you. Do not share this medicine with others. What if I miss a dose? It is  important not to miss your dose. Call your doctor or health care professional if you are unable to keep an appointment. What may interact with this medicine? Do not take this medicine with any of the following medications:  other medicines containing denosumab This medicine may also interact with the following medications:  medicines that lower your chance of fighting infection  steroid medicines like prednisone or cortisone This list may not describe all possible interactions. Give your health care provider a list of all the medicines, herbs, non-prescription drugs, or dietary supplements you use. Also tell them if you smoke, drink alcohol, or use illegal drugs. Some items may interact with your medicine. What should I watch for while using this medicine? Visit your doctor or health care professional for regular checks on your progress. Your doctor or health care professional may order blood tests and other tests to see how you are doing. Call your doctor or health care professional for advice if you get a fever, chills or sore throat, or other symptoms of a cold or flu. Do not treat yourself. This drug may decrease your body's ability to fight infection. Try to avoid being around people who are sick. You should make sure you get enough calcium and vitamin D while you are taking this medicine, unless your doctor tells you not to. Discuss the foods you eat and the vitamins you take with your health care professional. See your dentist regularly. Brush and floss your teeth as directed. Before you have any dental work done, tell your dentist you are   receiving this medicine. Do not become pregnant while taking this medicine or for 5 months after stopping it. Talk with your doctor or health care professional about your birth control options while taking this medicine. Women should inform their doctor if they wish to become pregnant or think they might be pregnant. There is a potential for serious side  effects to an unborn child. Talk to your health care professional or pharmacist for more information. What side effects may I notice from receiving this medicine? Side effects that you should report to your doctor or health care professional as soon as possible:  allergic reactions like skin rash, itching or hives, swelling of the face, lips, or tongue  bone pain  breathing problems  dizziness  jaw pain, especially after dental work  redness, blistering, peeling of the skin  signs and symptoms of infection like fever or chills; cough; sore throat; pain or trouble passing urine  signs of low calcium like fast heartbeat, muscle cramps or muscle pain; pain, tingling, numbness in the hands or feet; seizures  unusual bleeding or bruising  unusually weak or tired Side effects that usually do not require medical attention (report to your doctor or health care professional if they continue or are bothersome):  constipation  diarrhea  headache  joint pain  loss of appetite  muscle pain  runny nose  tiredness  upset stomach This list may not describe all possible side effects. Call your doctor for medical advice about side effects. You may report side effects to FDA at 1-800-FDA-1088. Where should I keep my medicine? This medicine is only given in a clinic, doctor's office, or other health care setting and will not be stored at home. NOTE: This sheet is a summary. It may not cover all possible information. If you have questions about this medicine, talk to your doctor, pharmacist, or health care provider.  2021 Elsevier/Gold Standard (2017-09-20 16:10:44)  Cyanocobalamin, Vitamin B12 injection What is this medicine? CYANOCOBALAMIN (sye an oh koe BAL a min) is a man made form of vitamin B12. Vitamin B12 is used in the growth of healthy blood cells, nerve cells, and proteins in the body. It also helps with the metabolism of fats and carbohydrates. This medicine is used to treat  people who can not absorb vitamin B12. This medicine may be used for other purposes; ask your health care provider or pharmacist if you have questions. COMMON BRAND NAME(S): B-12 Compliance Kit, B-12 Injection Kit, Cyomin, LA-12, Nutri-Twelve, Physicians EZ Use B-12, Primabalt What should I tell my health care provider before I take this medicine? They need to know if you have any of these conditions:  kidney disease  Leber's disease  megaloblastic anemia  an unusual or allergic reaction to cyanocobalamin, cobalt, other medicines, foods, dyes, or preservatives  pregnant or trying to get pregnant  breast-feeding How should I use this medicine? This medicine is injected into a muscle or deeply under the skin. It is usually given by a health care professional in a clinic or doctor's office. However, your doctor may teach you how to inject yourself. Follow all instructions. Talk to your pediatrician regarding the use of this medicine in children. Special care may be needed. Overdosage: If you think you have taken too much of this medicine contact a poison control center or emergency room at once. NOTE: This medicine is only for you. Do not share this medicine with others. What if I miss a dose? If you are given your dose at   a clinic or doctor's office, call to reschedule your appointment. If you give your own injections and you miss a dose, take it as soon as you can. If it is almost time for your next dose, take only that dose. Do not take double or extra doses. What may interact with this medicine?  colchicine  heavy alcohol intake This list may not describe all possible interactions. Give your health care provider a list of all the medicines, herbs, non-prescription drugs, or dietary supplements you use. Also tell them if you smoke, drink alcohol, or use illegal drugs. Some items may interact with your medicine. What should I watch for while using this medicine? Visit your doctor or  health care professional regularly. You may need blood work done while you are taking this medicine. You may need to follow a special diet. Talk to your doctor. Limit your alcohol intake and avoid smoking to get the best benefit. What side effects may I notice from receiving this medicine? Side effects that you should report to your doctor or health care professional as soon as possible:  allergic reactions like skin rash, itching or hives, swelling of the face, lips, or tongue  blue tint to skin  chest tightness, pain  difficulty breathing, wheezing  dizziness  red, swollen painful area on the leg Side effects that usually do not require medical attention (report to your doctor or health care professional if they continue or are bothersome):  diarrhea  headache This list may not describe all possible side effects. Call your doctor for medical advice about side effects. You may report side effects to FDA at 1-800-FDA-1088. Where should I keep my medicine? Keep out of the reach of children. Store at room temperature between 15 and 30 degrees C (59 and 85 degrees F). Protect from light. Throw away any unused medicine after the expiration date. NOTE: This sheet is a summary. It may not cover all possible information. If you have questions about this medicine, talk to your doctor, pharmacist, or health care provider.  2021 Elsevier/Gold Standard (2007-08-25 22:10:20)   

## 2020-07-29 ENCOUNTER — Other Ambulatory Visit: Payer: Self-pay

## 2020-07-29 DIAGNOSIS — C50811 Malignant neoplasm of overlapping sites of right female breast: Secondary | ICD-10-CM

## 2020-07-29 NOTE — Progress Notes (Signed)
Contacted Blood bank to confirm they are able to see the prepare order for 2 units of blood. Verified blood to be given 07/30/20.

## 2020-07-30 ENCOUNTER — Inpatient Hospital Stay: Payer: Medicaid Other

## 2020-07-30 ENCOUNTER — Other Ambulatory Visit: Payer: Self-pay

## 2020-07-30 DIAGNOSIS — Z170 Estrogen receptor positive status [ER+]: Secondary | ICD-10-CM

## 2020-07-30 DIAGNOSIS — C7951 Secondary malignant neoplasm of bone: Secondary | ICD-10-CM | POA: Diagnosis not present

## 2020-07-30 DIAGNOSIS — C50811 Malignant neoplasm of overlapping sites of right female breast: Secondary | ICD-10-CM

## 2020-07-30 LAB — PREPARE RBC (CROSSMATCH)

## 2020-07-30 MED ORDER — ACETAMINOPHEN 325 MG PO TABS
650.0000 mg | ORAL_TABLET | Freq: Once | ORAL | Status: AC
  Administered 2020-07-30: 650 mg via ORAL

## 2020-07-30 MED ORDER — DIPHENHYDRAMINE HCL 25 MG PO CAPS
ORAL_CAPSULE | ORAL | Status: AC
  Filled 2020-07-30: qty 1

## 2020-07-30 MED ORDER — SODIUM CHLORIDE 0.9% FLUSH
3.0000 mL | INTRAVENOUS | Status: DC | PRN
  Filled 2020-07-30: qty 10

## 2020-07-30 MED ORDER — SODIUM CHLORIDE 0.9% IV SOLUTION
250.0000 mL | Freq: Once | INTRAVENOUS | Status: AC
  Administered 2020-07-30: 250 mL via INTRAVENOUS
  Filled 2020-07-30: qty 250

## 2020-07-30 MED ORDER — ACETAMINOPHEN 325 MG PO TABS
ORAL_TABLET | ORAL | Status: AC
  Filled 2020-07-30: qty 2

## 2020-07-30 MED ORDER — DIPHENHYDRAMINE HCL 25 MG PO CAPS: 25.0000 mg | ORAL_CAPSULE | Freq: Once | ORAL | Status: DC

## 2020-07-30 MED ORDER — ACETAMINOPHEN 325 MG PO TABS: 650.0000 mg | ORAL_TABLET | Freq: Once | ORAL | Status: DC

## 2020-07-30 MED ORDER — DIPHENHYDRAMINE HCL 25 MG PO CAPS
25.0000 mg | ORAL_CAPSULE | Freq: Once | ORAL | Status: AC
  Administered 2020-07-30: 25 mg via ORAL

## 2020-07-30 NOTE — Progress Notes (Signed)
mbr discharged to home, VSS.  Escorted to lobby by staff in personal wheelchair.

## 2020-07-30 NOTE — Patient Instructions (Signed)

## 2020-07-31 LAB — BPAM RBC
Blood Product Expiration Date: 202204012359
Blood Product Expiration Date: 202204012359
ISSUE DATE / TIME: 202203050939
ISSUE DATE / TIME: 202203050939
Unit Type and Rh: 5100
Unit Type and Rh: 5100

## 2020-07-31 LAB — TYPE AND SCREEN
ABO/RH(D): O POS
Antibody Screen: NEGATIVE
Unit division: 0
Unit division: 0

## 2020-08-01 DIAGNOSIS — C50811 Malignant neoplasm of overlapping sites of right female breast: Secondary | ICD-10-CM | POA: Diagnosis not present

## 2020-08-02 ENCOUNTER — Ambulatory Visit: Payer: Self-pay

## 2020-08-02 DIAGNOSIS — C50811 Malignant neoplasm of overlapping sites of right female breast: Secondary | ICD-10-CM | POA: Diagnosis not present

## 2020-08-03 DIAGNOSIS — C50811 Malignant neoplasm of overlapping sites of right female breast: Secondary | ICD-10-CM | POA: Diagnosis not present

## 2020-08-04 ENCOUNTER — Other Ambulatory Visit: Payer: Self-pay

## 2020-08-04 DIAGNOSIS — C50811 Malignant neoplasm of overlapping sites of right female breast: Secondary | ICD-10-CM | POA: Diagnosis not present

## 2020-08-04 NOTE — Patient Outreach (Signed)
Medicaid Managed Care Social Work Note  08/04/2020 Name:  Suzanne [name] MRN:  [id number] DOB:  1960/10/28  Suzanne [name] is an 60 y.o. year old female who is a primary patient of [name], NP.  The Medicaid Managed Care Coordination team was consulted for assistance with:  Community Resources   Suzanne [name] was given information about Medicaid Managed CareCoordination services today. Suzanne [name] agreed to services and verbal consent obtained.  Engaged with patient  for by telephone forfollow up visit in response to referral for case management and/or care coordination services.   Assessments/Interventions:  Review of past medical history, allergies, medications, health status, including review of consultants reports, laboratory and other test data, was performed as part of comprehensive evaluation and provision of chronic care management services.  SDOH: (Social Determinant of Health) assessments and interventions performed:  BSW spoke with patient to follow up on resources provided. Patient stated she has not had the chance to contact the resources yet, but plans on doing so soon. Patient stated no other resources were needed at this time.  Advanced Directives Status:  Not addressed in this encounter.  Care Plan                 No Known Allergies  Medications Reviewed Today    Reviewed by Lane Hacker, [facility] (Pharmacist) on 07/19/20 at 1617  Med List Status: <None>  Medication Order Taking? Sig Documenting Provider Last Dose Status Informant  anastrozole (ARIMIDEX) 1 MG tablet [id number] Yes Take 1 tablet (1 mg total) by mouth daily. [name], Virgie Dad, MD Taking Active   antiseptic oral rinse Charlene Brooke) LIQD [id number] No 15 mLs by Mouth Rinse route as needed for dry mouth.  Patient not taking: No sig reported   [provider] Not Taking Consider Medication Status and Discontinue   Blood Pressure Monitor MISC [id number] Yes Use to check blood pressure twice per  day and more often if not feeling well Fulp, Cammie, MD Taking Active   calcium-vitamin D (OSCAL WITH D) 500-200 MG-UNIT tablet [id number] Yes Take 1 tablet by mouth 3 (three) times daily. Rai, Vernelle Emerald, MD Taking Active Family Member  cetirizine (ZYRTEC) 10 MG tablet [id number] Yes Take 1 tablet (10 mg total) by mouth daily. [name], NP Taking Active   famotidine (PEPCID) 20 MG tablet [id number] Yes Take 1 tablet (20 mg total) by mouth 2 (two) times daily as needed for heartburn or indigestion. [name], Virgie Dad, MD Taking Active   furosemide (LASIX) 20 MG tablet [id number] Yes TAKE 1 TABLET(20 MG) BY MOUTH DAILY [name], Virgie Dad, MD Taking Active   ketoconazole (NIZORAL) 2 % cream [id number] Yes Apply to affected areas daily as needed [name], Virgie Dad, MD Taking Active   MELATONIN PO [id number] Yes Take by mouth. [provider] Taking Active   methocarbamol (ROBAXIN) 500 MG tablet [id number] Yes Take 500 mg by mouth every 6 (six) hours as needed. [provider] Taking Active   nystatin (MYCOSTATIN/NYSTOP) powder [id number] Yes Apply 1 application topically as needed. [provider] Taking Active   palbociclib [name]) 75 MG tablet [id number] Yes Take 1 tablet (75 mg total) by mouth daily. TAKE 1 TABLET (100 MG TOTAL) BY MOUTH DAILY. TAKE FOR 21 DAYS ON, 7 DAYS OFF, REPEAT EVERY 28 DAYS. [name], Virgie Dad, MD Taking Active            Med Note [name]   Fri Jul 08, 2020  3:28 PM) Rockey Situ  to restart on Monday by Oncologist  polyethylene glycol (MIRALAX / GLYCOLAX) 17 g packet [id number] Yes Take 17 g by mouth as needed for constipation. [provider] Taking Active Family Member  sertraline (ZOLOFT) 50 MG tablet [id number] Yes Take 1.5 tablets (75 mg total) by mouth at bedtime. 90 day supply is 135 tablets [name], NP Taking Active   ZOFRAN 8 MG tablet [id number] Yes Take 8 mg by mouth every 8 (eight) hours. [provider] Taking  Active   Med List Note [name], RPH-CPP 11/13/19 3235): [name] filled at [place]          Patient Active Problem List   Diagnosis Date Noted  . Intractable back pain 02/03/2019  . Thoracic compression fracture, closed, initial encounter ([place]) 02/03/2019  . Essential hypertension 02/03/2019  . Genetic testing 01/27/2019  . Acute pharyngitis   . Bacteremia   . FTT (failure to thrive) in adult   . Fracture closed, humerus, shaft 01/09/2019  . Pneumonia 12/25/2018  . Acute respiratory disorder in immunocompromised patient ([place]) 12/25/2018  . Family history of breast cancer   . Family history of prostate cancer   . Family history of melanoma   . Family history of colon cancer   . Pressure injury of skin 11/24/2018  . Chronic respiratory failure with hypoxia and hypercapnia ([place]) 11/19/2018  . 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 Hypophosphatemia 11/19/2018  . Hypocalcemia 11/19/2018  . Aspiration pneumonia ([place]) 11/19/2018  . SOB (shortness of breath)   . Palliative care by specialist   . Carcinoma of breast, estrogen receptor positive, stage 4, right ([place]) 11/16/2018  . Hypoalbuminemia 11/16/2018  . Pleural effusion 11/14/2018  . Goals of care, counseling/discussion 11/06/2018  . Malignant neoplasm of overlapping sites of right breast in female, estrogen receptor positive ([place]) 10/31/2018  . Lung metastasis ([place]) 10/31/2018  . Pain from bone metastases ([place]) 10/31/2018  . Morbid obesity with BMI of 40.0-44.9, adult ([place]) 10/31/2018  . Bone injury   . Pleural effusion, bilateral   . Shortness of breath   . Abnormal breast finding   . Hypokalemia   . Breast skin changes   . Macrocytic anemia   . Bone metastasis ([place])   . Anemia, B12 deficiency 10/26/2018    Conditions to be addressed/monitored per PCP order:  resources  Care Plan : General Plan of Care (Adult)  Updates made by Ethelda Chick since 08/04/2020 12:00 AM    Problem:  Health Promotion or Disease Self-Management (General Plan of Care)     Long-Range Goal: Self-Management Plan Developed   Start Date: 07/14/2020  Expected End Date: 09/19/2020  Recent Progress: On track  Priority: High  Note:   Current Barriers:  . Care Coordination needs related to managing breast cancer treatment. Suzanne [name] is being treated for stage IV breast cancer. She and her daughter live together. Suzanne [name] is unable to drive and her daughter does not drive. This makes it difficult to get groceries and OTC medications. They have had groceries delivered, which is very expensive after paying the extra fees. Suzanne [name] is able to walk around in the house, but requires a wheelchair when going to the [place] for appointments. Her wheelchair is missing a screw on the leg support and the brake is not working correctly. They have called [employer] for assistance with fixing the wheelchair and were told they would need to bring the wheelchair to the office, which is in [facility]. RNCM  following up on wheelchair. Patient has not been contacted by [employer].  . Film/video editor.  . Transportation barriers . Difficulty obtaining medications . Food Insecurities  Nurse Case Manager Clinical Goal(s):  . patient will work with Care Guide to address needs related to procuring groceries and assistance applying for food benefits . patient will meet with RN Care Manager to address barriers to managing her healthcare at home . patient will work with CM team pharmacist for medication management  Interventions:  . Inter-disciplinary care team collaboration (see longitudinal plan of care) . Evaluation of current treatment plan related to anemia and patient's adherence to plan as established by provider. . Reviewed medications with patient and discussed difficulty obtaining OTC medications . Discussed plans with patient for ongoing care management follow up and provided patient with direct contact  information for care management team . Provided patient with MyChart educational materials related to anemia . Care Guide referral for food insecurities . Pharmacy referral for medication management . Follow up with [employer] for repair of wheelchair- RNCM calling [employer] for the 3rd time for repair/replacement of wheelchair. Spoke with Ivin Booty who will directly follow up with Suzanne [name] for measurements of wheelchair and will send a new wheelchair. . BSW spoke with patient. BSW provided patient with the website epass.u[place]ourn.cz to apply for foodstamps online and the phone number for Aging and Disability Services [phone]. Marland Kitchen Update: BSW spoke with patient to follow up on resources provided. Patient stated she has not had the chance to contact the resources yet, but plans on doing so soon. Patient stated no other resources were needed at this time.  Patient Goals/Self-Care Activities - begin a notebook of services in my neighborhood or community - call 211 when I need some help - follow-up on any referrals for help I am given - have a back-up plan - make a list of family or friends that I can call  - call [phone] for medical transportation provided by [facility] - call to cancel if needed - keep a calendar with prescription refill dates - keep a calendar with appointment dates   Follow Up Plan: Telephone follow up appointment with Managed Medicaid care management team member scheduled for:08/17/20 @ 2:30pm        Follow up:  Patient agrees to Care Plan and Follow-up.  Plan: The patient has been provided with contact information for the Managed Medicaid care management team and has been advised to call with any health related questions or concerns.    Mickel Fuchs, BSW, [place]  High Risk Managed Medicaid Team

## 2020-08-04 NOTE — Patient Instructions (Signed)
Visit Information  Ms. Schraeder was given information about Medicaid Managed Care team care coordination services as a part of their [place] Medicaid benefit. [name] verbally consented to engagement with the [facility] Managed Care team.   For questions related to your [facility], please call: [phone] or visit the homepage here: [email or web address]  If you would like to schedule transportation through your [facility], please call the following number at least 2 days in advance of your appointment: [phone].   Ms. Apsey - following are the goals we discussed in your visit today:  Goals Addressed   None      The patient has been provided with contact information for the Managed Medicaid care management team and has been advised to call with any health related questions or concerns.   Ethelda Chick  Following is a copy of your plan of care:

## 2020-08-05 DIAGNOSIS — C50811 Malignant neoplasm of overlapping sites of right female breast: Secondary | ICD-10-CM | POA: Diagnosis not present

## 2020-08-08 ENCOUNTER — Other Ambulatory Visit: Payer: Self-pay

## 2020-08-08 DIAGNOSIS — C50811 Malignant neoplasm of overlapping sites of right female breast: Secondary | ICD-10-CM | POA: Diagnosis not present

## 2020-08-08 NOTE — Patient Outreach (Signed)
Patient had questions about refills and medications.  Iron bottle says take "TID PRN FOOD" but sticker says, "Empty stomach."   Explained iron is best absorbed on empty stomach with OJ or coffee

## 2020-08-09 ENCOUNTER — Ambulatory Visit: Payer: Medicaid Other

## 2020-08-09 ENCOUNTER — Other Ambulatory Visit: Payer: Medicaid Other

## 2020-08-09 DIAGNOSIS — C50811 Malignant neoplasm of overlapping sites of right female breast: Secondary | ICD-10-CM | POA: Diagnosis not present

## 2020-08-10 DIAGNOSIS — C50811 Malignant neoplasm of overlapping sites of right female breast: Secondary | ICD-10-CM | POA: Diagnosis not present

## 2020-08-11 DIAGNOSIS — C50811 Malignant neoplasm of overlapping sites of right female breast: Secondary | ICD-10-CM | POA: Diagnosis not present

## 2020-08-12 DIAGNOSIS — C50811 Malignant neoplasm of overlapping sites of right female breast: Secondary | ICD-10-CM | POA: Diagnosis not present

## 2020-08-15 ENCOUNTER — Other Ambulatory Visit: Payer: Self-pay | Admitting: Oncology

## 2020-08-15 DIAGNOSIS — C50811 Malignant neoplasm of overlapping sites of right female breast: Secondary | ICD-10-CM | POA: Diagnosis not present

## 2020-08-15 DIAGNOSIS — D519 Vitamin B12 deficiency anemia, unspecified: Secondary | ICD-10-CM | POA: Insufficient documentation

## 2020-08-15 DIAGNOSIS — D518 Other vitamin B12 deficiency anemias: Secondary | ICD-10-CM

## 2020-08-16 DIAGNOSIS — C50811 Malignant neoplasm of overlapping sites of right female breast: Secondary | ICD-10-CM | POA: Diagnosis not present

## 2020-08-17 ENCOUNTER — Other Ambulatory Visit: Payer: Self-pay | Admitting: *Deleted

## 2020-08-17 ENCOUNTER — Other Ambulatory Visit: Payer: Self-pay

## 2020-08-17 DIAGNOSIS — C50811 Malignant neoplasm of overlapping sites of right female breast: Secondary | ICD-10-CM | POA: Diagnosis not present

## 2020-08-17 NOTE — Patient Instructions (Signed)
Visit Information  Suzanne [name] was given information about Medicaid Managed Care team care coordination services as a part of their [place] Medicaid benefit. Suzanne [name] verbally consented to engagement with the [facility] Managed Care team.   For questions related to your [facility], please call: [phone] or visit the homepage here: [email or web address]  If you would like to schedule transportation through your [facility], please call the following number at least 2 days in advance of your appointment: [phone].   Call the [place] at [phone], at any time, 24 hours a day, 7 days a week. If you are in danger or need immediate medical attention call 911.  Suzanne [name] - following are the goals we discussed in your visit today:  Goals Addressed            This Visit's Progress   . Find Help in My Community       Timeframe:  Long-Range Goal Priority:  High Start Date:  07/14/20                           Expected End Date:  10/18/20                   Follow Up Date 10/18/20   - begin a notebook of services in my neighborhood or community - call 211 when I need some help - follow-up on any referrals for help I am given - have a back-up plan - make a list of family or friends that I can call    Why is this important?    Knowing how and where to find help for yourself or family in your neighborhood and community is an important skill.   You will want to take some steps to learn how.        . Make and Keep All Appointments       Timeframe:  Long-Range Goal Priority:  High Start Date:   07/14/20                          Expected End Date:   10/18/20                    Follow Up Date 10/18/20    - call [place] [phone] to schedule a dental exam - call [phone] for medical transportation provided by  [facility] - call to cancel if needed - keep a calendar with prescription refill dates - keep a calendar with appointment dates    Why is this important?    Part of staying healthy is seeing the doctor for follow-up care.   If you forget your appointments, there are some things you can do to stay on track.           Please see education materials related to iron rich foods provided by MyChart link.    Iron-Rich Diet  Iron is a mineral that helps your body to produce hemoglobin. Hemoglobin is a protein in red blood cells that carries oxygen to your body's tissues. Eating too little iron may cause you to feel weak and tired, and it can increase your risk of infection. Iron is naturally found in many foods, and many foods have iron added to them (iron-fortified foods). You may need to follow an iron-rich diet if you do not have enough  iron in your body due to certain medical conditions. The amount of iron that you need each day depends on your age, your sex, and any medical conditions you have. Follow instructions from your health care provider or a diet and nutrition specialist (dietitian) about how much iron you should eat each day. What are tips for following this plan? Reading food labels  Check food labels to see how many milligrams (mg) of iron are in each serving. Cooking  Cook foods in pots and pans that are made from iron.  Take these steps to make it easier for your body to absorb iron from certain foods: ? Soak beans overnight before cooking. ? Soak whole grains overnight and drain them before using. ? Ferment flours before baking, such as by using yeast in bread dough. Meal planning  When you eat foods that contain iron, you should eat them with foods that are high in vitamin C. These include oranges, peppers, tomatoes, potatoes, and mango. Vitamin C helps your body to absorb iron. General information  Take iron supplements only as told by your health care  provider. An overdose of iron can be life-threatening. If you were prescribed iron supplements, take them with orange juice or a vitamin C supplement.  When you eat iron-fortified foods or take an iron supplement, you should also eat foods that naturally contain iron, such as meat, poultry, and fish. Eating naturally iron-rich foods helps your body to absorb the iron that is added to other foods or contained in a supplement.  Certain foods and drinks prevent your body from absorbing iron properly. Avoid eating these foods in the same meal as iron-rich foods or with iron supplements. These foods include: ? Coffee, black tea, and red wine. ? Milk, dairy products, and foods that are high in calcium. ? Beans and soybeans. ? Whole grains. What foods should I eat? Fruits Prunes. Raisins. Eat fruits high in vitamin C, such as oranges, grapefruits, and strawberries, alongside iron-rich foods. Vegetables Spinach (cooked). Green peas. Broccoli. Fermented vegetables. Eat vegetables high in vitamin C, such as leafy greens, potatoes, bell peppers, and tomatoes, alongside iron-rich foods. Grains Iron-fortified breakfast cereal. Iron-fortified whole-wheat bread. Enriched rice. Sprouted grains. Meats and other proteins Beef liver. Oysters. Beef. Shrimp. [place]. Chicken. [place]. Sardines. Chickpeas. Nuts. Tofu. Pumpkin seeds. Beverages Tomato juice. Fresh orange juice. Prune juice. Hibiscus tea. Fortified instant breakfast shakes. Sweets and desserts Blackstrap molasses. Seasonings and condiments Tahini. Fermented soy sauce. Other foods Wheat germ. The items listed above may not be a complete list of recommended foods and beverages. Contact a dietitian for more information. What foods should I avoid? Grains Whole grains. Bran cereal. Bran flour. Oats. Meats and other proteins Soybeans. Products made from soy protein. Black beans. Lentils. Mung beans. Split peas. Dairy Milk. Cream. Cheese. Yogurt.  Cottage cheese. Beverages Coffee. Black tea. Red wine. Sweets and desserts Cocoa. Chocolate. Ice cream. Other foods Basil. Oregano. Large amounts of parsley. The items listed above may not be a complete list of foods and beverages to avoid. Contact a dietitian for more information. Summary  Iron is a mineral that helps your body to produce hemoglobin. Hemoglobin is a protein in red blood cells that carries oxygen to your body's tissues.  Iron is naturally found in many foods, and many foods have iron added to them (iron-fortified foods).  When you eat foods that contain iron, you should eat them with foods that are high in vitamin C. Vitamin C helps your body to absorb  iron.  Certain foods and drinks prevent your body from absorbing iron properly, such as whole grains and dairy products. You should avoid eating these foods in the same meal as iron-rich foods or with iron supplements. This information is not intended to replace advice given to you by your health care provider. Make sure you discuss any questions you have with your health care provider. Document Revised: 04/26/2017 Document Reviewed: 04/09/2017 Elsevier Patient Education  2021 [place].   Patient verbalizes understanding of instructions provided today.   Telephone follow up appointment with Managed Medicaid care management team member scheduled for:10/18/20 @ 3:30pm  [name] RN, BSN [place] RN Care Coordinator   Following is a copy of your plan of care:  Patient Care Plan: General Plan of Care (Adult)    Problem Identified: Health Promotion or Disease Self-Management (General Plan of Care)     Long-Range Goal: Self-Management Plan Developed   Start Date: 07/14/2020  Expected End Date: 10/18/2020  This Visit's Progress: On track  Recent Progress: On track  Priority: High  Note:   Current Barriers:  . Care Coordination needs related to managing breast cancer treatment. Suzanne.  [name] is being treated for stage IV breast cancer. She and her daughter live together. Suzanne [name] is unable to drive and her daughter does not drive. This makes it difficult to get groceries and OTC medications. They have had groceries delivered, which is very expensive after paying the extra fees. Suzanne [name] is able to walk around in the house, but requires a wheelchair when going to the [place] for appointments. Her wheelchair is missing a screw on the leg support and the brake is not working correctly. They have called [employer] for assistance with fixing the wheelchair and were told they would need to bring the wheelchair to the office, which is in [facility]. RNCM following up on wheelchair. Patient has not been contacted by [employer]. -Update- Suzanne [name] reports receiving a new wheelchair from [place]. She has been working with Ovid Curd and now her medications are being delivered, which is a big help. Suzanne [name] is needing to schedule a dental exam and needs a list of providers accepting Medicaid. She has not had a chance to follow up on resources received from [facility], but plans to do so. . Film/video editor.  . Transportation barriers . Difficulty obtaining medications . Food Insecurities  Nurse Case Manager Clinical Goal(s):  . patient will work with [place] to address needs related to procuring groceries and assistance applying for food benefits-Met . patient will meet with RN Care Manager to address barriers to managing her healthcare at home . patient will work with CM team pharmacist for medication management-Met  Interventions:  . Inter-disciplinary care team collaboration (see longitudinal plan of care) . Evaluation of current treatment plan related to anemia and patient's adherence to plan as established by provider. . Reviewed medications with patient and discussed iron supplements . Collaborated with Ovid Curd regarding patient having medication questions . Discussed plans with patient  for ongoing care management follow up and provided patient with direct contact information for care management team . Provided patient with MyChart educational materials related to iron-rich foods . Provided patient with Dental office accepting Medicaid-[facility] [phone] . BSW spoke with patient. BSW provided patient with the website epass.uMourn.cz to apply for foodstamps online and the phone number for Aging and Disability Services [phone]. Marland Kitchen Update: BSW spoke with patient to follow up on resources  provided. Patient stated she has not had the chance to contact the resources yet, but plans on doing so soon. Patient stated no other resources were needed at this time.  Patient Goals/Self-Care Activities - begin a notebook of services in my neighborhood or community - call 211 when I need some help - follow-up on any referrals for help I am given - have a back-up plan - make a list of family or friends that I can call - call [place] [phone] to schedule a dental exam - call [phone] for medical transportation provided by [facility] - call to cancel if needed - keep a calendar with prescription refill dates - keep a calendar with appointment dates    Follow Up Plan: Telephone follow up appointment with Managed Medicaid care management team member scheduled for:10/18/20 @ 3:30pm      Patient Care Plan: Medication Management    Problem Identified: Health Promotion or Disease Self-Management (General Plan of Care)     Goal: Medication Management   Note:   Evidence-based guidance:   Review biopsychosocial determinants of health screens.   Determine level of modifiable health risk.   Assess level of patient activation, level of readiness, importance and confidence to make changes.   Evoke change talk using open-ended questions, pros and cons, as well as looking forward.   Identify areas where behavior change may lead to improved  health.   Partner with patient to develop a robust self-management plan that includes lifestyle factors, such as weight loss, exercise and healthy nutrition, as well as goals specific to disease risks.   Support patient and family/caregiver active participation in decision-making and self-management plan.   Implement additional goals and interventions based on identified risk factors to reduce health risk.   Facilitate advance care planning.   Review need for preventive screening based on age, sex, family history and health history.   Notes:

## 2020-08-17 NOTE — Patient Outreach (Signed)
Medicaid Managed Care   Nurse Care Manager Note  08/17/2020 Name:  Suzanne [name] MRN:  [id number] DOB:  18-May-1961  Suzanne [name] is an 60 y.o. year old female who is [name] primary patient of Suzanne Pounds, NP.  The [facility] Managed Care Coordination team was consulted for assistance with:    anemia  Suzanne [name] was given information about Medicaid Managed Care Coordination team services today. Suzanne [name] agreed to services and verbal consent obtained.  Engaged with patient by telephone for follow up visit in response to provider referral for case management and/or care coordination services.   Assessments/Interventions:  Review of past medical history, allergies, medications, health status, including review of consultants reports, laboratory and other test data, was performed as part of comprehensive evaluation and provision of chronic care management services.  SDOH (Social Determinants of Health) assessments and interventions performed:   Care Plan  No Known Allergies  Medications Reviewed Today    Reviewed by Suzanne Montane, RN (Registered Nurse) on 08/17/20 at 1447  Med List Status: <None>  Medication Order Taking? Sig Documenting Provider Last Dose Status Informant  anastrozole (ARIMIDEX) 1 MG tablet [id number] Yes Take 1 tablet (1 mg total) by mouth daily. Suzanne [name], Suzanne Dad, MD Taking Active   antiseptic oral rinse (BIOTENE) LIQD [id number]  15 mLs by Mouth Rinse route as needed for dry mouth.  Patient not taking: No sig reported   [provider]  Active   Blood Pressure Monitor MISC [id number]  Medium sized cuff. Use to check blood pressure twice per day and more often if not feeling well. I10.0 Suzanne Pounds, NP  Active   calcium-vitamin D (OSCAL WITH D) 500-200 MG-UNIT tablet [id number] Yes Take 1 tablet by mouth 3 (three) times daily. Suzanne [name], Suzanne Dad, MD Taking Active   cetirizine (ZYRTEC) 10 MG tablet [id number] Yes Take 1 tablet (10 mg total) by mouth  daily. Suzanne Pounds, NP Taking Active   cyanocobalamin ([facility] VITAMIN B12) 2000 MCG tablet [id number] Yes Take 1 tablet (2,000 mcg total) by mouth daily. Suzanne [name], Suzanne Dad, MD Taking Active   famotidine (PEPCID) 20 MG tablet [id number] Yes Take 1 tablet (20 mg total) by mouth 2 (two) times daily as needed for heartburn or indigestion. Suzanne [name], Suzanne Dad, MD Taking Active   ferrous sulfate 325 (65 FE) MG EC tablet [id number] Yes Take 1 tablet (325 mg total) by mouth 3 (three) times daily with meals. Suzanne [name], Suzanne Dad, MD Taking Active            Med Note Suzanne [name], Suzanne [name]   Wed Aug 17, 2020  2:44 PM) Taking once daily  folic acid (FOLVITE) 1 MG tablet [id number] Yes Take 1 tablet (1 mg total) by mouth daily. Suzanne [name], Suzanne Dad, MD Taking Active   furosemide (LASIX) 20 MG tablet [id number] Yes Take 1 tablet (20 mg total) by mouth daily. Suzanne [name], Suzanne Dad, MD Taking Active   ketoconazole (NIZORAL) 2 % cream [id number] Yes Apply to affected areas daily as needed Suzanne [name], Suzanne Dad, MD Taking Active   methocarbamol (ROBAXIN) 500 MG tablet [id number] No Take 500 mg by mouth every 6 (six) hours as needed.  Patient not taking: Reported on 08/17/2020   [provider] Not Taking Active   nystatin (MYCOSTATIN/NYSTOP) powder [id number] Yes Apply 1 application topically as needed. Suzanne Phlegm, NP Taking Active   palbociclib [facility]) 75 MG tablet [id number] Yes Take 1 tablet (75 mg total) by mouth daily. TAKE 1  TABLET (100 MG TOTAL) BY MOUTH DAILY. TAKE FOR 21 DAYS ON, 7 DAYS OFF, REPEAT EVERY 28 DAYS. Suzanne [name], Suzanne Dad, MD Taking Active            Med Note Suzanne [name]   Fri Jul 08, 2020  3:28 PM) Told to restart on Monday by Oncologist  polyethylene glycol (MIRALAX / GLYCOLAX) 17 g packet [id number] Yes Take 17 g by mouth as needed. Suzanne [name], Suzanne Dad, MD Taking Active   sertraline (ZOLOFT) 50 MG tablet [id number] Yes Take 1.5 tablets (75 mg total) by mouth at bedtime. 90 day supply is 135  tablets Suzanne Pounds, NP Taking Active   ZOFRAN 8 MG tablet [id number] Yes Take 1 tablet (8 mg total) by mouth every 8 (eight) hours. Suzanne Phlegm, NP Taking Active   Med List Note Suzanne [name], RPH-CPP 11/13/19 1093): Suzanne [name] filled at [place]          Patient Active Problem List   Diagnosis Date Noted  . B12 deficiency anemia 08/15/2020  . Intractable back pain 02/03/2019  . Thoracic compression fracture, closed, initial encounter ([place]) 02/03/2019  . Essential hypertension 02/03/2019  . Genetic testing 01/27/2019  . Acute pharyngitis   . Bacteremia   . FTT (failure to thrive) in adult   . Fracture closed, humerus, shaft 01/09/2019  . Pneumonia 12/25/2018  . Acute respiratory disorder in immunocompromised patient ([place]) 12/25/2018  . Family history of breast cancer   . Family history of prostate cancer   . Family history of melanoma   . Family history of colon cancer   . Pressure injury of skin 11/24/2018  . Chronic respiratory failure with hypoxia and hypercapnia ([place]) 11/19/2018  . 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 Hypophosphatemia 11/19/2018  . Hypocalcemia 11/19/2018  . Aspiration pneumonia ([place]) 11/19/2018  . SOB (shortness of breath)   . Palliative care by specialist   . Carcinoma of breast, estrogen receptor positive, stage 4, right ([place]) 11/16/2018  . Hypoalbuminemia 11/16/2018  . Pleural effusion 11/14/2018  . Goals of care, counseling/discussion 11/06/2018  . Malignant neoplasm of overlapping sites of right breast in female, estrogen receptor positive ([place]) 10/31/2018  . Lung metastasis ([place]) 10/31/2018  . Pain from bone metastases ([place]) 10/31/2018  . Morbid obesity with BMI of 40.0-44.9, adult ([place]) 10/31/2018  . Bone injury   . Pleural effusion, bilateral   . Shortness of breath   . Abnormal breast finding   . Hypokalemia   . Breast skin changes   . Macrocytic anemia   . Bone metastasis ([place])   . Anemia, B12  deficiency 10/26/2018    Conditions to be addressed/monitored per PCP order:  anemia  Care Plan : General Plan of Care (Adult)  Updates made by Suzanne Montane, RN since 08/17/2020 12:00 AM    Problem: Health Promotion or Disease Self-Management (General Plan of Care)     Long-Range Goal: Self-Management Plan Developed   Start Date: 07/14/2020  Expected End Date: 10/18/2020  This Visit's Progress: On track  Recent Progress: On track  Priority: High  Note:   Current Barriers:  . Care Coordination needs related to managing breast cancer treatment. Suzanne [name] is being treated for stage IV breast cancer. She and her daughter live together. Suzanne [name] is unable to drive and her daughter does not drive. This makes it difficult to get groceries and OTC medications. They have had groceries delivered, which is very expensive after paying the extra fees. Suzanne [name] is  able to walk around in the house, but requires [name] wheelchair when going to the [place] for appointments. Her wheelchair is missing [name] screw on the leg support and the brake is not working correctly. They have called [employer] for assistance with fixing the wheelchair and were told they would need to bring the wheelchair to the office, which is in [facility]. RNCM following up on wheelchair. Patient has not been contacted by [employer]. -Update- Suzanne [name] reports receiving [name] new wheelchair from [place]. She has been working with Ovid Curd and now her medications are being delivered, which is [name] big help. Suzanne [name] is needing to schedule [name] dental exam and needs [name] list of providers accepting Medicaid. She has not had [name] chance to follow up on resources received from [place], but plans to do so. . Film/video editor.  . Transportation barriers . Difficulty obtaining medications . Food Insecurities  Nurse Case Manager Clinical Goal(s):  . patient will work with [place] to address needs related to procuring groceries and assistance applying for food  benefits-Met . patient will meet with RN Care Manager to address barriers to managing her healthcare at [name] . patient will work with CM team pharmacist for medication management-Met  Interventions:  . Inter-disciplinary care team collaboration (see longitudinal plan of care) . Evaluation of current treatment plan related to anemia and patient's adherence to plan as established by provider. . Reviewed medications with patient and discussed iron supplements . Collaborated with Ovid Curd regarding patient having medication questions . Discussed plans with patient for ongoing care management follow up and provided patient with direct contact information for care management team . Provided patient with MyChart educational materials related to iron-rich foods . Provided patient with Dental office accepting Medicaid-[facility] [phone] . BSW spoke with patient. BSW provided patient with the website epass.uMourn.cz to apply for foodstamps online and the phone number for Aging and Disability Services [phone]. Marland Kitchen Update: BSW spoke with patient to follow up on resources provided. Patient stated she has not had the chance to contact the resources yet, but plans on doing so soon. Patient stated no other resources were needed at this time.  Patient Goals/Self-Care Activities - begin [name] notebook of services in my neighborhood or community - call 211 when I need some help - follow-up on any referrals for help I am given - have [name] back-up plan - make [name] list of family or friends that I can call - call [place] [phone] to schedule [name] dental exam - call [phone] for medical transportation provided by [facility] - call to cancel if needed - keep [name] calendar with prescription refill dates - keep [name] calendar with appointment dates    Follow Up Plan: Telephone follow up appointment with Managed Medicaid care management team member scheduled for:10/18/20 @ 3:30pm         Follow Up:  Patient agrees to Care Plan and Follow-up.  Plan: The Managed Medicaid care management team will reach out to the patient again over the next 60 days.  Date/time of next scheduled RN care management/care coordination outreach: 10/18/20 @ 3:30pm  [name] RN, [place] RN Care Coordinator

## 2020-08-19 DIAGNOSIS — C50811 Malignant neoplasm of overlapping sites of right female breast: Secondary | ICD-10-CM | POA: Diagnosis not present

## 2020-08-22 ENCOUNTER — Other Ambulatory Visit: Payer: Self-pay

## 2020-08-22 ENCOUNTER — Inpatient Hospital Stay: Payer: Medicaid Other

## 2020-08-22 VITALS — BP 118/72 | HR 72 | Resp 18

## 2020-08-22 DIAGNOSIS — J90 Pleural effusion, not elsewhere classified: Secondary | ICD-10-CM

## 2020-08-22 DIAGNOSIS — Z170 Estrogen receptor positive status [ER+]: Secondary | ICD-10-CM

## 2020-08-22 DIAGNOSIS — G893 Neoplasm related pain (acute) (chronic): Secondary | ICD-10-CM

## 2020-08-22 DIAGNOSIS — C7951 Secondary malignant neoplasm of bone: Secondary | ICD-10-CM

## 2020-08-22 DIAGNOSIS — C50811 Malignant neoplasm of overlapping sites of right female breast: Secondary | ICD-10-CM

## 2020-08-22 DIAGNOSIS — R0602 Shortness of breath: Secondary | ICD-10-CM

## 2020-08-22 DIAGNOSIS — C7801 Secondary malignant neoplasm of right lung: Secondary | ICD-10-CM

## 2020-08-22 DIAGNOSIS — D649 Anemia, unspecified: Secondary | ICD-10-CM

## 2020-08-22 LAB — CBC WITH DIFFERENTIAL/PLATELET
Abs Immature Granulocytes: 0.02 10*3/uL (ref 0.00–0.07)
Basophils Absolute: 0.1 10*3/uL (ref 0.0–0.1)
Basophils Relative: 2 %
Eosinophils Absolute: 0.1 10*3/uL (ref 0.0–0.5)
Eosinophils Relative: 2 %
HCT: 34.5 % — ABNORMAL LOW (ref 36.0–46.0)
Hemoglobin: 11.2 g/dL — ABNORMAL LOW (ref 12.0–15.0)
Immature Granulocytes: 1 %
Lymphocytes Relative: 21 %
Lymphs Abs: 0.7 10*3/uL (ref 0.7–4.0)
MCH: 32.7 pg (ref 26.0–34.0)
MCHC: 32.5 g/dL (ref 30.0–36.0)
MCV: 100.6 fL — ABNORMAL HIGH (ref 80.0–100.0)
Monocytes Absolute: 0.2 10*3/uL (ref 0.1–1.0)
Monocytes Relative: 7 %
Neutro Abs: 2.2 10*3/uL (ref 1.7–7.7)
Neutrophils Relative %: 67 %
Platelets: 358 10*3/uL (ref 150–400)
RBC: 3.43 MIL/uL — ABNORMAL LOW (ref 3.87–5.11)
RDW: 23.1 % — ABNORMAL HIGH (ref 11.5–15.5)
WBC: 3.3 10*3/uL — ABNORMAL LOW (ref 4.0–10.5)
nRBC: 0 % (ref 0.0–0.2)

## 2020-08-22 LAB — COMPREHENSIVE METABOLIC PANEL
ALT: 14 U/L (ref 0–44)
AST: 20 U/L (ref 15–41)
Albumin: 3.8 g/dL (ref 3.5–5.0)
Alkaline Phosphatase: 118 U/L (ref 38–126)
Anion gap: 13 (ref 5–15)
BUN: 18 mg/dL (ref 8–23)
CO2: 28 mmol/L (ref 22–32)
Calcium: 9.2 mg/dL (ref 8.9–10.3)
Chloride: 100 mmol/L (ref 98–111)
Creatinine, Ser: 1.16 mg/dL — ABNORMAL HIGH (ref 0.44–1.00)
GFR, Estimated: 54 mL/min — ABNORMAL LOW (ref >60)
Glucose, Bld: 115 mg/dL — ABNORMAL HIGH (ref 70–99)
Potassium: 3.4 mmol/L — ABNORMAL LOW (ref 3.5–5.1)
Sodium: 141 mmol/L (ref 135–145)
Total Bilirubin: 0.5 mg/dL (ref 0.3–1.2)
Total Protein: 7.3 g/dL (ref 6.5–8.1)

## 2020-08-22 LAB — SAMPLE TO BLOOD BANK

## 2020-08-22 MED ORDER — DENOSUMAB 120 MG/1.7ML ~~LOC~~ SOLN
120.0000 mg | Freq: Once | SUBCUTANEOUS | Status: AC
  Administered 2020-08-22: 120 mg via SUBCUTANEOUS

## 2020-08-22 MED ORDER — DENOSUMAB 120 MG/1.7ML ~~LOC~~ SOLN
SUBCUTANEOUS | Status: AC
  Filled 2020-08-22: qty 1.7

## 2020-08-22 MED ORDER — CYANOCOBALAMIN 1000 MCG/ML IJ SOLN
INTRAMUSCULAR | Status: AC
  Filled 2020-08-22: qty 1

## 2020-08-22 MED ORDER — CYANOCOBALAMIN 1000 MCG/ML IJ SOLN
1000.0000 ug | Freq: Once | INTRAMUSCULAR | Status: AC
  Administered 2020-08-22: 1000 ug via INTRAMUSCULAR

## 2020-08-22 NOTE — Patient Instructions (Signed)
Denosumab injection What is this medicine? DENOSUMAB (den oh sue mab) slows bone breakdown. Prolia is used to treat osteoporosis in women after menopause and in men, and in people who are taking corticosteroids for 6 months or more. Xgeva is used to treat a high calcium level due to cancer and to prevent bone fractures and other bone problems caused by multiple myeloma or cancer bone metastases. Xgeva is also used to treat giant cell tumor of the bone. This medicine may be used for other purposes; ask your health care provider or pharmacist if you have questions. COMMON BRAND NAME(S): Prolia, XGEVA What should I tell my health care provider before I take this medicine? They need to know if you have any of these conditions:  dental disease  having surgery or tooth extraction  infection  kidney disease  low levels of calcium or Vitamin D in the blood  malnutrition  on hemodialysis  skin conditions or sensitivity  thyroid or parathyroid disease  an unusual reaction to denosumab, other medicines, foods, dyes, or preservatives  pregnant or trying to get pregnant  breast-feeding How should I use this medicine? This medicine is for injection under the skin. It is given by a health care professional in a hospital or clinic setting. A special MedGuide will be given to you before each treatment. Be sure to read this information carefully each time. For Prolia, talk to your pediatrician regarding the use of this medicine in children. Special care may be needed. For Xgeva, talk to your pediatrician regarding the use of this medicine in children. While this drug may be prescribed for children as young as 13 years for selected conditions, precautions do apply. Overdosage: If you think you have taken too much of this medicine contact a poison control center or emergency room at once. NOTE: This medicine is only for you. Do not share this medicine with others. What if I miss a dose? It is  important not to miss your dose. Call your doctor or health care professional if you are unable to keep an appointment. What may interact with this medicine? Do not take this medicine with any of the following medications:  other medicines containing denosumab This medicine may also interact with the following medications:  medicines that lower your chance of fighting infection  steroid medicines like prednisone or cortisone This list may not describe all possible interactions. Give your health care provider a list of all the medicines, herbs, non-prescription drugs, or dietary supplements you use. Also tell them if you smoke, drink alcohol, or use illegal drugs. Some items may interact with your medicine. What should I watch for while using this medicine? Visit your doctor or health care professional for regular checks on your progress. Your doctor or health care professional may order blood tests and other tests to see how you are doing. Call your doctor or health care professional for advice if you get a fever, chills or sore throat, or other symptoms of a cold or flu. Do not treat yourself. This drug may decrease your body's ability to fight infection. Try to avoid being around people who are sick. You should make sure you get enough calcium and vitamin D while you are taking this medicine, unless your doctor tells you not to. Discuss the foods you eat and the vitamins you take with your health care professional. See your dentist regularly. Brush and floss your teeth as directed. Before you have any dental work done, tell your dentist you are   receiving this medicine. Do not become pregnant while taking this medicine or for 5 months after stopping it. Talk with your doctor or health care professional about your birth control options while taking this medicine. Women should inform their doctor if they wish to become pregnant or think they might be pregnant. There is a potential for serious side  effects to an unborn child. Talk to your health care professional or pharmacist for more information. What side effects may I notice from receiving this medicine? Side effects that you should report to your doctor or health care professional as soon as possible:  allergic reactions like skin rash, itching or hives, swelling of the face, lips, or tongue  bone pain  breathing problems  dizziness  jaw pain, especially after dental work  redness, blistering, peeling of the skin  signs and symptoms of infection like fever or chills; cough; sore throat; pain or trouble passing urine  signs of low calcium like fast heartbeat, muscle cramps or muscle pain; pain, tingling, numbness in the hands or feet; seizures  unusual bleeding or bruising  unusually weak or tired Side effects that usually do not require medical attention (report to your doctor or health care professional if they continue or are bothersome):  constipation  diarrhea  headache  joint pain  loss of appetite  muscle pain  runny nose  tiredness  upset stomach This list may not describe all possible side effects. Call your doctor for medical advice about side effects. You may report side effects to FDA at 1-800-FDA-1088. Where should I keep my medicine? This medicine is only given in a clinic, doctor's office, or other health care setting and will not be stored at home. NOTE: This sheet is a summary. It may not cover all possible information. If you have questions about this medicine, talk to your doctor, pharmacist, or health care provider.  2021 Elsevier/Gold Standard (2017-09-20 16:10:44)  Cyanocobalamin, Vitamin B12 injection What is this medicine? CYANOCOBALAMIN (sye an oh koe BAL a min) is a man made form of vitamin B12. Vitamin B12 is used in the growth of healthy blood cells, nerve cells, and proteins in the body. It also helps with the metabolism of fats and carbohydrates. This medicine is used to treat  people who can not absorb vitamin B12. This medicine may be used for other purposes; ask your health care provider or pharmacist if you have questions. COMMON BRAND NAME(S): B-12 Compliance Kit, B-12 Injection Kit, Cyomin, LA-12, Nutri-Twelve, Physicians EZ Use B-12, Primabalt What should I tell my health care provider before I take this medicine? They need to know if you have any of these conditions:  kidney disease  Leber's disease  megaloblastic anemia  an unusual or allergic reaction to cyanocobalamin, cobalt, other medicines, foods, dyes, or preservatives  pregnant or trying to get pregnant  breast-feeding How should I use this medicine? This medicine is injected into a muscle or deeply under the skin. It is usually given by a health care professional in a clinic or doctor's office. However, your doctor may teach you how to inject yourself. Follow all instructions. Talk to your pediatrician regarding the use of this medicine in children. Special care may be needed. Overdosage: If you think you have taken too much of this medicine contact a poison control center or emergency room at once. NOTE: This medicine is only for you. Do not share this medicine with others. What if I miss a dose? If you are given your dose at  a clinic or doctor's office, call to reschedule your appointment. If you give your own injections and you miss a dose, take it as soon as you can. If it is almost time for your next dose, take only that dose. Do not take double or extra doses. What may interact with this medicine?  colchicine  heavy alcohol intake This list may not describe all possible interactions. Give your health care provider a list of all the medicines, herbs, non-prescription drugs, or dietary supplements you use. Also tell them if you smoke, drink alcohol, or use illegal drugs. Some items may interact with your medicine. What should I watch for while using this medicine? Visit your doctor or  health care professional regularly. You may need blood work done while you are taking this medicine. You may need to follow a special diet. Talk to your doctor. Limit your alcohol intake and avoid smoking to get the best benefit. What side effects may I notice from receiving this medicine? Side effects that you should report to your doctor or health care professional as soon as possible:  allergic reactions like skin rash, itching or hives, swelling of the face, lips, or tongue  blue tint to skin  chest tightness, pain  difficulty breathing, wheezing  dizziness  red, swollen painful area on the leg Side effects that usually do not require medical attention (report to your doctor or health care professional if they continue or are bothersome):  diarrhea  headache This list may not describe all possible side effects. Call your doctor for medical advice about side effects. You may report side effects to FDA at 1-800-FDA-1088. Where should I keep my medicine? Keep out of the reach of children. Store at room temperature between 15 and 30 degrees C (59 and 85 degrees F). Protect from light. Throw away any unused medicine after the expiration date. NOTE: This sheet is a summary. It may not cover all possible information. If you have questions about this medicine, talk to your doctor, pharmacist, or health care provider.  2021 Elsevier/Gold Standard (2007-08-25 22:10:20)

## 2020-08-23 ENCOUNTER — Other Ambulatory Visit (HOSPITAL_COMMUNITY): Payer: Self-pay

## 2020-08-23 DIAGNOSIS — C50811 Malignant neoplasm of overlapping sites of right female breast: Secondary | ICD-10-CM | POA: Diagnosis not present

## 2020-08-23 LAB — CANCER ANTIGEN 27.29: CA 27.29: 52.5 U/mL — ABNORMAL HIGH (ref 0.0–38.6)

## 2020-08-24 DIAGNOSIS — C50811 Malignant neoplasm of overlapping sites of right female breast: Secondary | ICD-10-CM | POA: Diagnosis not present

## 2020-08-25 ENCOUNTER — Encounter: Payer: Self-pay | Admitting: *Deleted

## 2020-08-26 DIAGNOSIS — C50811 Malignant neoplasm of overlapping sites of right female breast: Secondary | ICD-10-CM | POA: Diagnosis not present

## 2020-08-26 DIAGNOSIS — C7801 Secondary malignant neoplasm of right lung: Secondary | ICD-10-CM | POA: Diagnosis not present

## 2020-08-26 DIAGNOSIS — S22000A Wedge compression fracture of unspecified thoracic vertebra, initial encounter for closed fracture: Secondary | ICD-10-CM | POA: Diagnosis not present

## 2020-08-26 DIAGNOSIS — R0602 Shortness of breath: Secondary | ICD-10-CM | POA: Diagnosis not present

## 2020-08-26 DIAGNOSIS — J9621 Acute and chronic respiratory failure with hypoxia: Secondary | ICD-10-CM | POA: Diagnosis not present

## 2020-08-30 ENCOUNTER — Other Ambulatory Visit (HOSPITAL_COMMUNITY): Payer: Self-pay

## 2020-08-30 DIAGNOSIS — C50811 Malignant neoplasm of overlapping sites of right female breast: Secondary | ICD-10-CM | POA: Diagnosis not present

## 2020-08-30 MED FILL — Palbociclib Tab 75 MG: ORAL | 28 days supply | Qty: 21 | Fill #0 | Status: AC

## 2020-08-31 DIAGNOSIS — C50811 Malignant neoplasm of overlapping sites of right female breast: Secondary | ICD-10-CM | POA: Diagnosis not present

## 2020-09-01 ENCOUNTER — Telehealth: Payer: Self-pay | Admitting: Nurse Practitioner

## 2020-09-01 ENCOUNTER — Other Ambulatory Visit (HOSPITAL_COMMUNITY): Payer: Self-pay

## 2020-09-01 DIAGNOSIS — C50811 Malignant neoplasm of overlapping sites of right female breast: Secondary | ICD-10-CM | POA: Diagnosis not present

## 2020-09-01 NOTE — Telephone Encounter (Signed)
Received [place] LTSS [id number] form to be filled out by [facility] and fax back to [phone] or [phone]. Form placed in PCP box

## 2020-09-02 ENCOUNTER — Other Ambulatory Visit (HOSPITAL_COMMUNITY): Payer: Self-pay

## 2020-09-02 DIAGNOSIS — C50811 Malignant neoplasm of overlapping sites of right female breast: Secondary | ICD-10-CM | POA: Diagnosis not present

## 2020-09-02 NOTE — Telephone Encounter (Signed)
PCS form has been placed on case manager desk to be completed

## 2020-09-05 ENCOUNTER — Other Ambulatory Visit (HOSPITAL_COMMUNITY): Payer: Self-pay

## 2020-09-05 DIAGNOSIS — C50811 Malignant neoplasm of overlapping sites of right female breast: Secondary | ICD-10-CM | POA: Diagnosis not present

## 2020-09-05 NOTE — Telephone Encounter (Signed)
Caller name: Janett Billow  Relation to pt: from [facility]  Call back number: [phone] fax # [phone]   Reason for call:  Checking on the status of below. Time is sensitive

## 2020-09-06 ENCOUNTER — Ambulatory Visit: Payer: Medicaid Other | Admitting: Oncology

## 2020-09-06 ENCOUNTER — Other Ambulatory Visit: Payer: Medicaid Other

## 2020-09-06 ENCOUNTER — Ambulatory Visit: Payer: Medicaid Other

## 2020-09-06 DIAGNOSIS — C50811 Malignant neoplasm of overlapping sites of right female breast: Secondary | ICD-10-CM | POA: Diagnosis not present

## 2020-09-06 NOTE — Telephone Encounter (Signed)
Call returned  to [facility] to inform her that PCS form is completed and waiting for PCP signature.  Will fax back as soon as it is signed. Message left with call back # for this CM

## 2020-09-07 ENCOUNTER — Other Ambulatory Visit: Payer: Self-pay

## 2020-09-07 DIAGNOSIS — C50811 Malignant neoplasm of overlapping sites of right female breast: Secondary | ICD-10-CM | POA: Diagnosis not present

## 2020-09-07 NOTE — Patient Outreach (Signed)
Patient called requesting Cetirizine and Ketoconazole 2% cream refill  Will deliver today or tomorrow at the latest, there are refills present

## 2020-09-07 NOTE — Telephone Encounter (Signed)
Signed PCS form faxed to [facility]

## 2020-09-08 ENCOUNTER — Other Ambulatory Visit: Payer: Self-pay | Admitting: Oncology

## 2020-09-08 DIAGNOSIS — C50811 Malignant neoplasm of overlapping sites of right female breast: Secondary | ICD-10-CM | POA: Diagnosis not present

## 2020-09-08 MED ORDER — KETOCONAZOLE 2 % EX CREA: TOPICAL_CREAM | CUTANEOUS | 6 refills | Status: DC

## 2020-09-09 DIAGNOSIS — C50811 Malignant neoplasm of overlapping sites of right female breast: Secondary | ICD-10-CM | POA: Diagnosis not present

## 2020-09-12 ENCOUNTER — Other Ambulatory Visit: Payer: Self-pay | Admitting: Oncology

## 2020-09-12 DIAGNOSIS — C50811 Malignant neoplasm of overlapping sites of right female breast: Secondary | ICD-10-CM | POA: Diagnosis not present

## 2020-09-12 IMAGING — DX CHEST - 2 VIEW
3 series · 3 of 3 positions shown · non-contrast
Comparison: 11/20/2018
COMPARISON: 11/20/2018

Addendum:
CLINICAL DATA: Pleural effusion, shortness of breath

EXAM:
CHEST - 2 VIEW

[chest lat (1 of 2)]
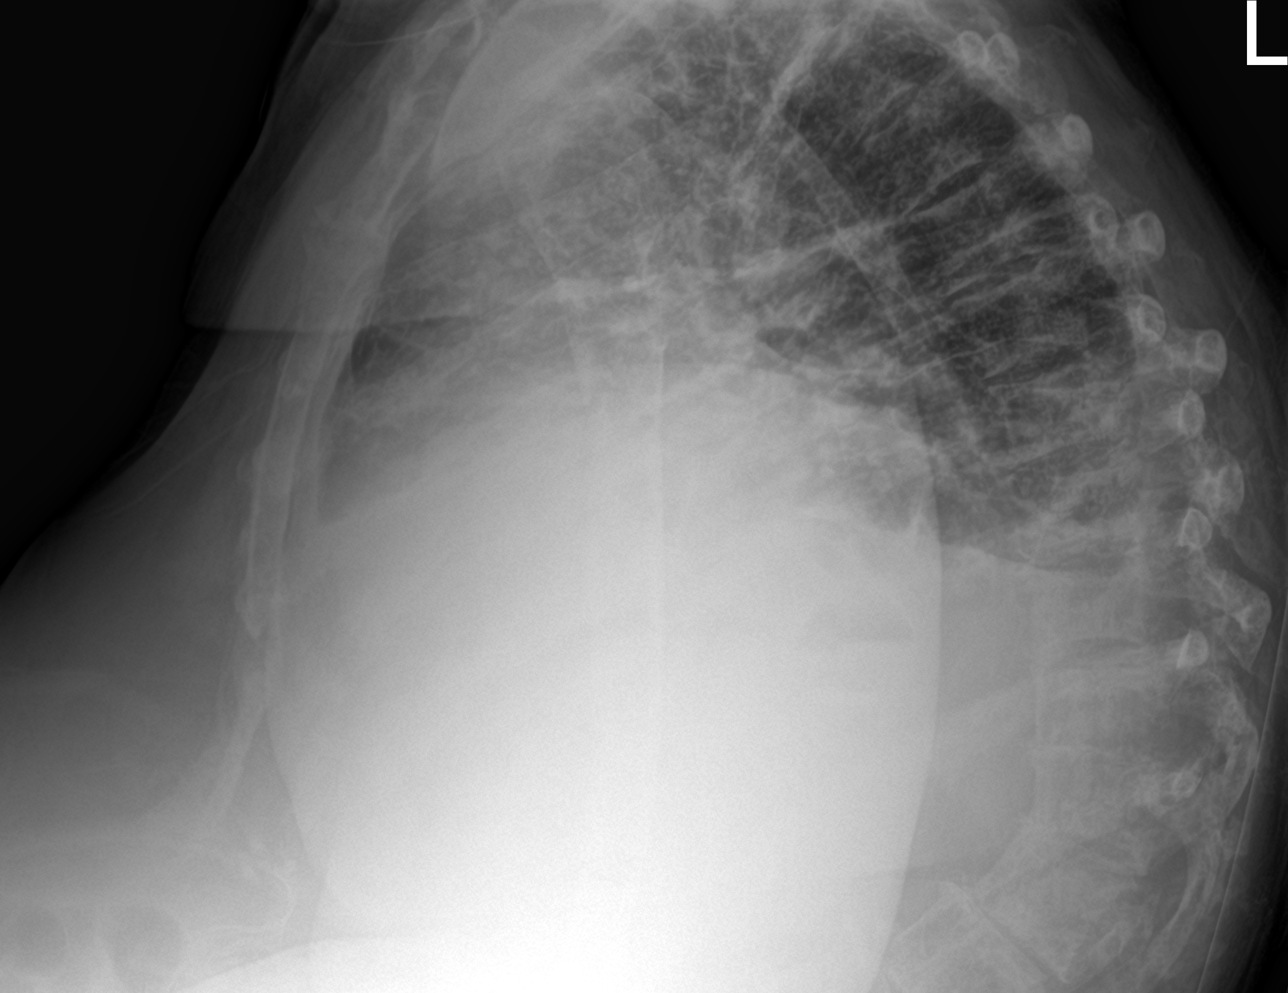

[chest ap]
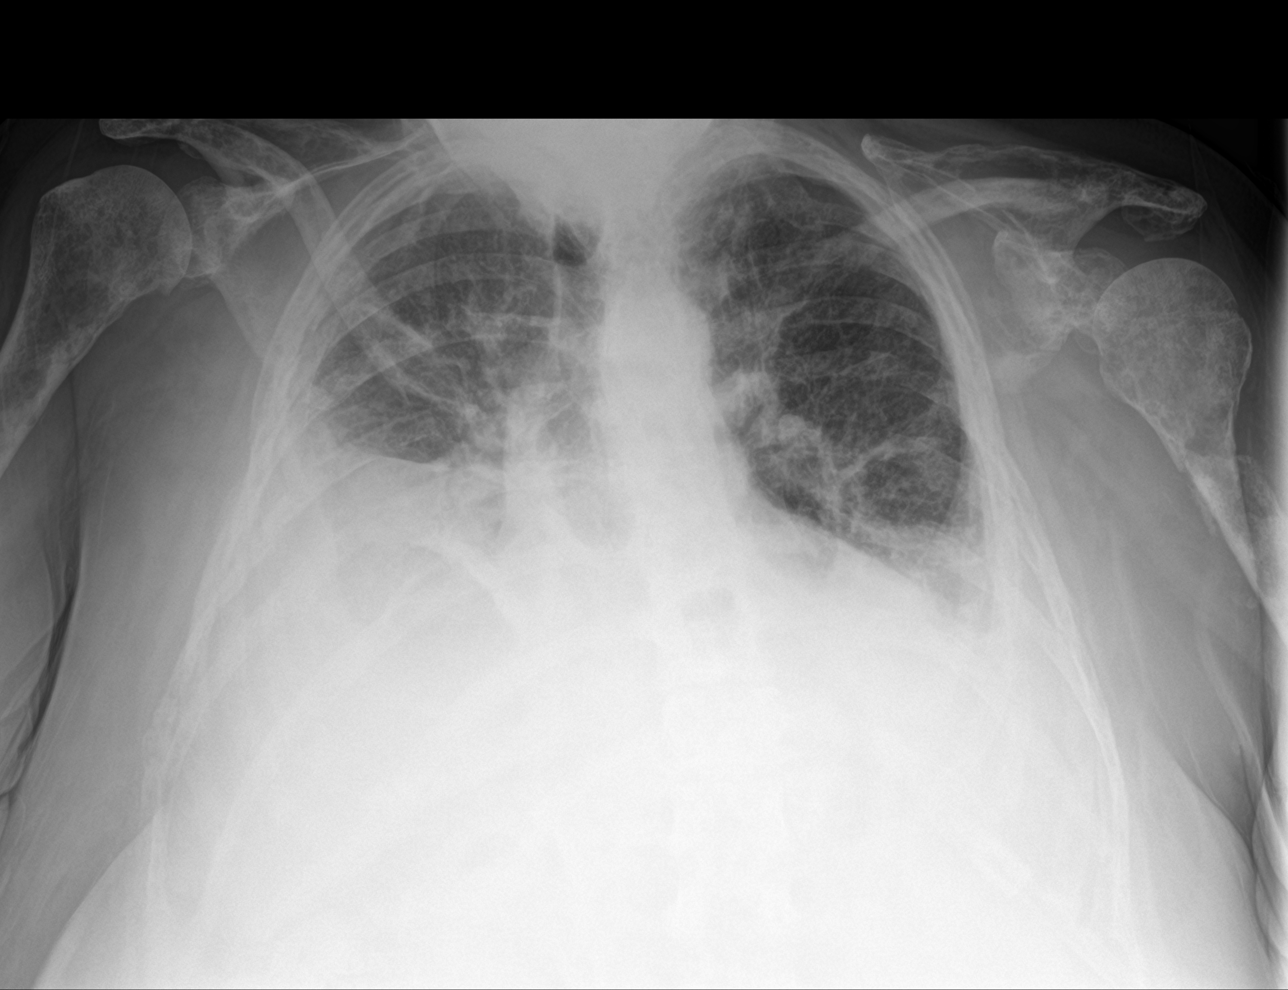

[chest lat (2 of 2)]
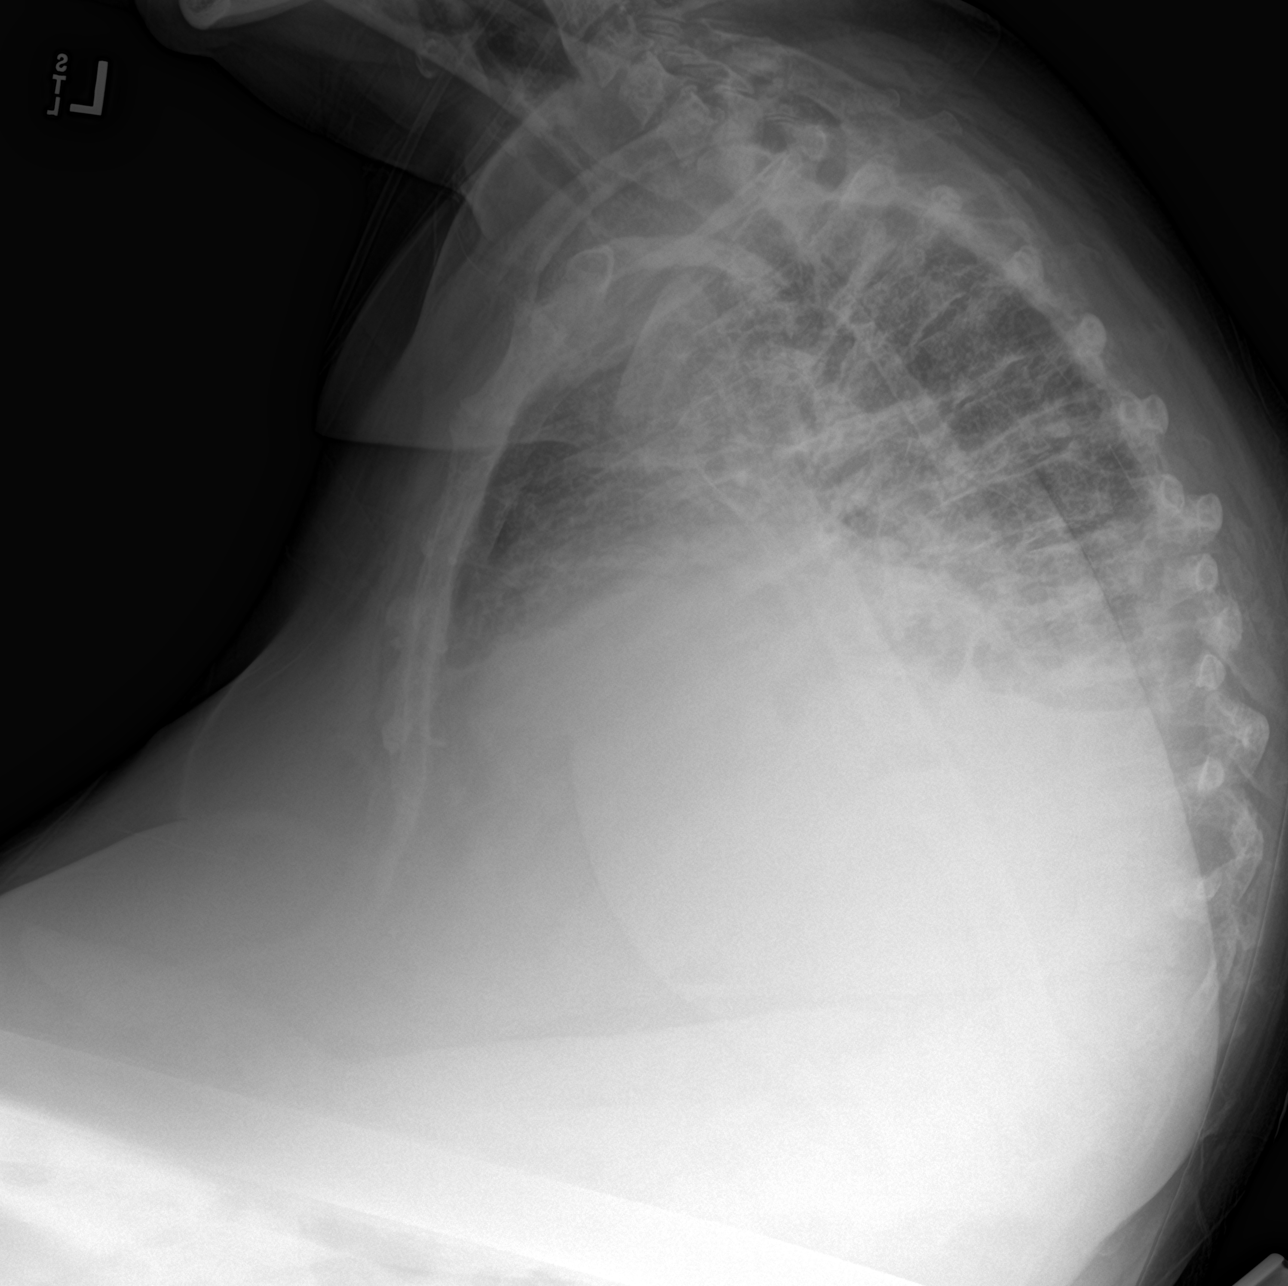

[3 of 3 positions shown; findings below may reference images not displayed]

FINDINGS: Upper normal heart size.

Stable mediastinal contours.

Chronic elevation of RIGHT diaphragm.

Small BILATERAL pleural effusions and bibasilar atelectasis.

Perihilar infiltrates.

Improved RIGHT upper lobe infiltrate since previous study.

No pneumothorax.

Bones severely demineralized with old posttraumatic deformity of the
proximal LEFT humerus.
IMPRESSION: Bibasilar pleural effusions and atelectasis.

Mild BILATERAL pulmonary infiltrates, improved in RIGHT upper lobe
since prior study.

ADDENDUM:
Mixed lytic and sclerotic osseous metastases are seen throughout
ribs, scapula, and humeri.

The posttraumatic deformity at the proximal LEFT humerus likely
represents a pathologic fracture through a lytic lesion of the
proximal LEFT humeral diaphysis.

*** End of Addendum ***
FINDINGS: Upper normal heart size.

Stable mediastinal contours.

Chronic elevation of RIGHT diaphragm.

Small BILATERAL pleural effusions and bibasilar atelectasis.

Perihilar infiltrates.

Improved RIGHT upper lobe infiltrate since previous study.

No pneumothorax.

Bones severely demineralized with old posttraumatic deformity of the
proximal LEFT humerus.
IMPRESSION: Bibasilar pleural effusions and atelectasis.

Mild BILATERAL pulmonary infiltrates, improved in RIGHT upper lobe
since prior study.

## 2020-09-12 IMAGING — DX RIGHT SHOULDER - 1 VIEW
1 series · 1 of 1 positions shown · non-contrast
Comparison: None

CLINICAL DATA: Bone metastasis, metastatic breast cancer, shoulder
pain

EXAM:
RIGHT SHOULDER - 1 VIEW

[shoulder ap neutral]
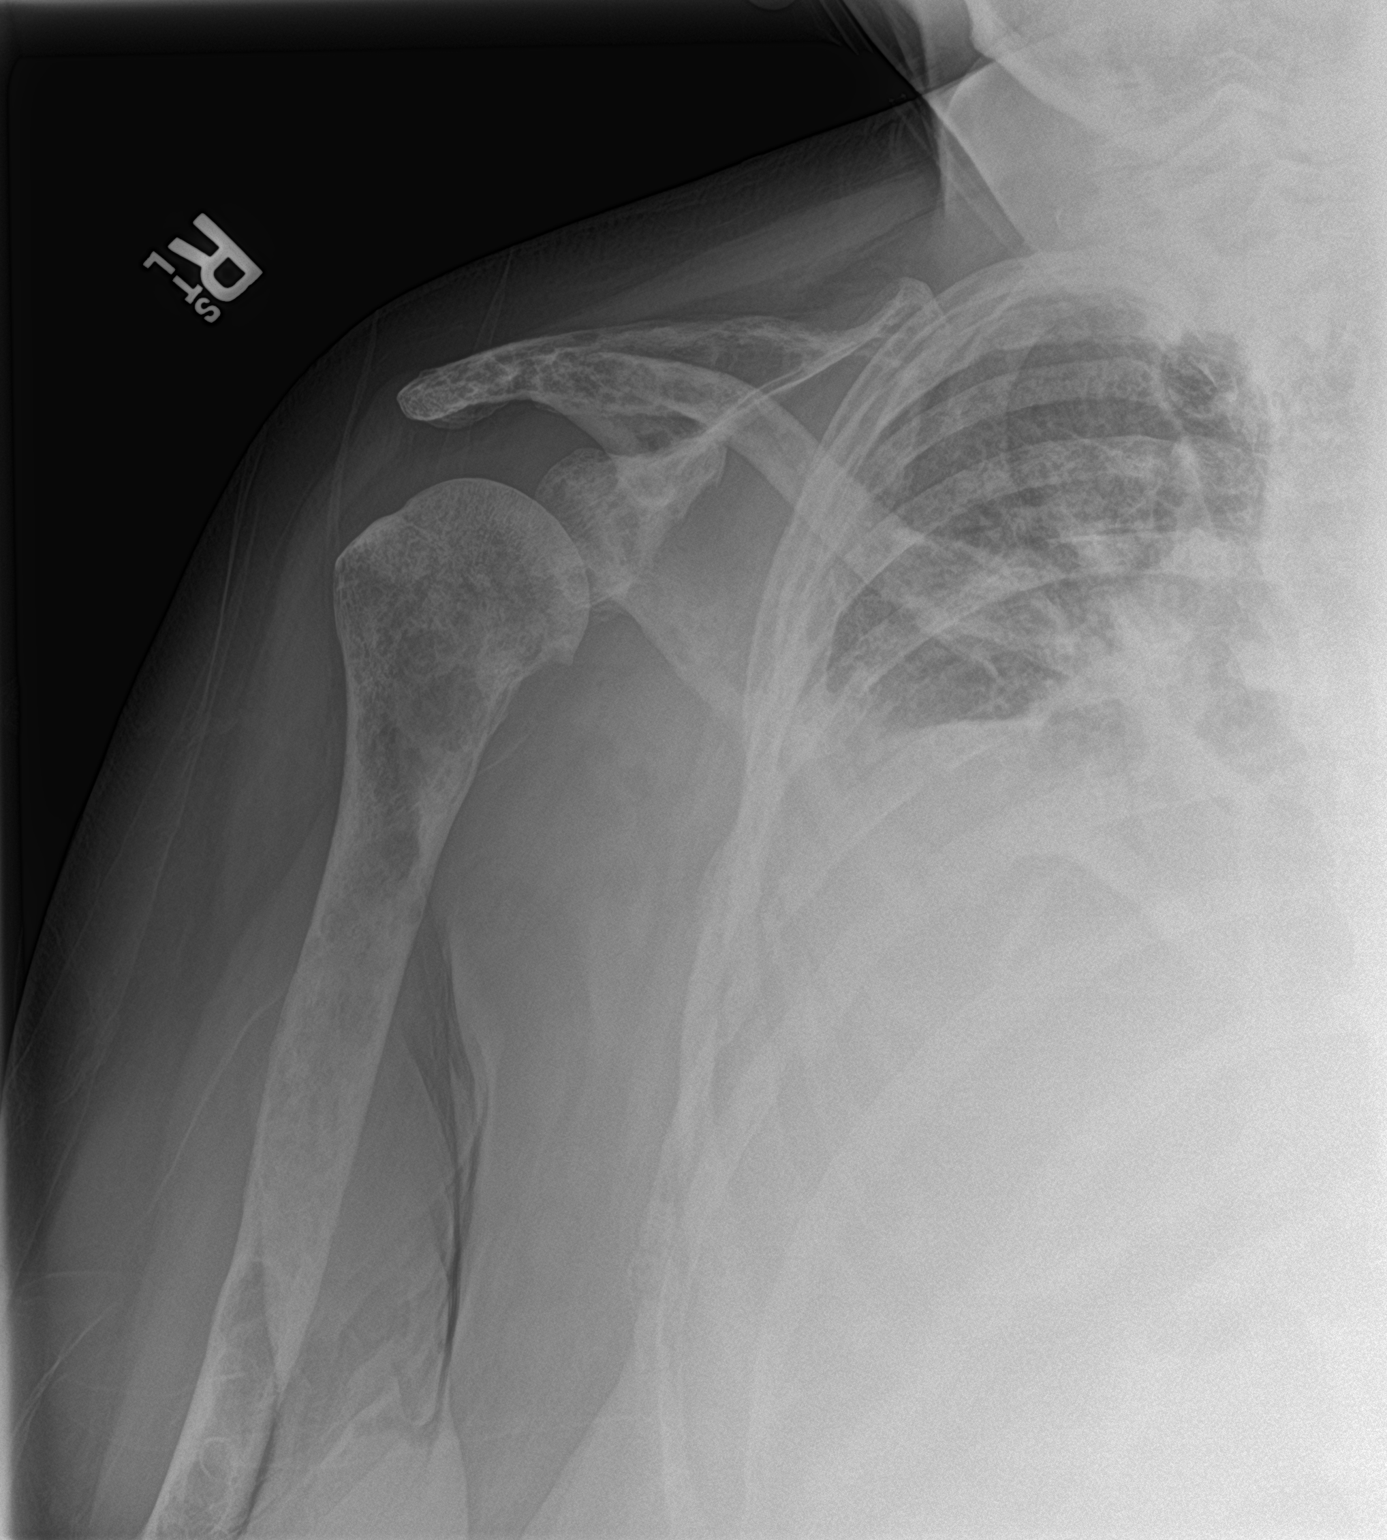

[1 of 1 positions shown; findings below may reference images not displayed]

FINDINGS: Osseous demineralization.

Mottled appearance of ribs, scapula and humerus compatible with
diffuse osseous metastatic disease.

No fracture dislocation.
IMPRESSION: Widespread osseous metastatic disease involving the RIGHT ribs,
humerus and scapula.

## 2020-09-13 DIAGNOSIS — C50811 Malignant neoplasm of overlapping sites of right female breast: Secondary | ICD-10-CM | POA: Diagnosis not present

## 2020-09-14 DIAGNOSIS — C50811 Malignant neoplasm of overlapping sites of right female breast: Secondary | ICD-10-CM | POA: Diagnosis not present

## 2020-09-15 ENCOUNTER — Telehealth: Payer: Self-pay

## 2020-09-15 DIAGNOSIS — C50811 Malignant neoplasm of overlapping sites of right female breast: Secondary | ICD-10-CM | POA: Diagnosis not present

## 2020-09-15 NOTE — Telephone Encounter (Signed)
Call placed to [facility], spoke to [employer].  She confirmed that the patient is receiving PCS from [place]. The current authorization is until  09/24/2020 and the next authorization will be 09/25/2020 -03/27/2021.

## 2020-09-16 DIAGNOSIS — C50811 Malignant neoplasm of overlapping sites of right female breast: Secondary | ICD-10-CM | POA: Diagnosis not present

## 2020-09-18 NOTE — Progress Notes (Signed)
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  She is accompanied by her daughter.  Suzanne [name] continues on anastrozole.   She is also on palbociclib at 75 mg daily 21 days on 7 days off.  She was switched from 100 mg to 75 mg on 07/11/2020 due to cytopenias.   She receives [place] every 4 weeks. She has no issues with receiving this, and denies any dental concerns.   She was started on B12 replacement 07/28/2020.  REVIEW OF SYSTEMS: Suzanne [name] tells me that she is able to do more at home and continue to do it longer as well.  She denies cough phlegm production pleurisy or shortness of breath.  She is not getting out of the house more because she says she is afraid of COVID.  The rash is better.  We tried to get her to a dermatologist but they did not accept her insurance.  We are still looking for a dermatologist t who does..  Aside from that a detailed review of systems today was stable   COVID 19 VACCINATION STATUS: Status post [place] x2 with booster December 2021   HISTORY OF CURRENT ILLNESS: From the original inpatient consult note:  Suzanne [name] is a 60 year old female from [place], [place] without significant past medical history with exception of obesity, remote history of gallstones, and remote history of mastitis.  The patient presented to the hospital on 10/26/2018 with a 3-week history  of worsening shortness of breath and lower extremity edema.  The patient reported that her shortness of breath worsened with exertion.  Her lower extremity edema did not improve with elevation.  She also had an intermittent pr oductive cough with rare sputum production.  The patient was noted to be anemic with a hemoglobin of 4.5 with a clumped platelets noted on admission.  Stool for occult blood was positive.  Patient received 2 units of packed red blood cells.    The patient reported a rash under her breast x1 week which has been foul-smelling.  She also reported that she has some discomfort and thickening/hardness of her right breast for several months.  She states that her right breast is much larger than her left.  She notes some occasional bloody discharge from her breast once in a while.  A chest x-ray on admission showed diffuse bilateral interstitial opacity of unknown chronicity, findings could be related to interstitial inflammatory process, atypical/viral pneumonia, or possible metastatic disease.  There are also sclerotic and lytic lesions within the clavicles, bilateral ribs, shoulders which was concerning for diffuse few skeletal metastatic disease.    This prompted a CT scan of the chest with contrast which showed bilateral axillary and mediastinal adenopathy, borderline hilar lymph nodes bilaterally, findings likely flecked metastatic lymphadenopathy, moderate bilateral pleural effusions, extensive interstitial thickening and reticulonodular opacities throughout the lungs, cannot exclude lymphangitic spread of tumor, diffuse skeletal metastases.  The patient's subsequent history is as detailed below.   PAST MEDICAL HISTORY: Past Medical History:  Diagnosis Date  . Cancer ([place])   . Family history of breast cancer   . Family history of colon cancer   . Family history of melanoma   . Family history of prostate cancer   . Gallstones   . Glaucoma   . Mastitis    reports history of  recurrent mastitis  . Metastatic breast cancer ([place])   . Obesity     PAST SURGICAL HISTORY: Past Surgical History:  Procedure Laterality Date  . GLAUCOMA SURGERY  late 1990's  . IR THORACENTESIS ASP PLEURAL SPACE W/IMG GUIDE  10/28/2018  . IR THORACENTESIS ASP PLEURAL SPACE W/IMG GUIDE  10/29/2018  . OPEN REDUCTION INTERNAL FIXATION (ORIF) DISTAL RADIAL FRACTURE Left 01/09/2019   Procedure: OPEN REDUCTION INTERNAL FIXATION (ORIF) HUMERAL FRACTURE;  Surgeon: Netta Cedars, MD;  Location: [facility];  Service: Orthopedics;  Laterality: Left;    FAMILY HISTORY: Family History  Problem Relation Age of Onset  . Breast cancer Sister        in her 63's-70s  . Heart disease Brother        CABG  . Heart disease Brother        CABG  . Skin cancer Brother        melanoma  . Colon cancer Cousin   . Heart attack Mother   . Heart attack Father   . Prostate cancer Brother   Patient's father passed away at age 27, and her mother at 10, both from heart attacks. The patient has 8 siblings, 6 brothers and 1 sister.  Her sister was diagnosed with breast cancer. One brother has prostate cancer, another brother had a melanoma removed.    GYNECOLOGIC HISTORY:  Menarche: 60 years old Age at first live birth: 60 years old [name] P 1 LMP age 37 Contraceptive: used for 2-3 years with no problems HRT never used  Hysterectomy? no   SOCIAL HISTORY:  Ameera worked for an Engineer, manufacturing systems. She is now on disability. She is widowed. She lives at home with her daughter.  [name], 37, who is a Psychologist, occupational at [employer] center.  The patient has no grandchildren. She is not a Ambulance person.   ADVANCED DIRECTIVES: not on file; she has the paperwork already. She intends to name her daughter as her HCPOA.   HEALTH MAINTENANCE: Social History   Tobacco Use  . Smoking status: Never Smoker  . Smokeless tobacco: Never Used  . Tobacco comment: second hand smoke exposure  Vaping Use  . Vaping Use: Never  used  Substance Use Topics  . Alcohol use: No  . Drug use: No    Colonoscopy:   PAP:   Bone density:  Mammography:   No Known Allergies  Current Outpatient Medications  Medication Sig Dispense Refill  . anastrozole (ARIMIDEX) 1 MG tablet Take 1 tablet (1 mg total) by mouth daily. 90 tablet 4  . antiseptic oral rinse (BIOTENE) LIQD 15 mLs by Mouth Rinse route as needed for dry mouth. (Patient not taking: No sig reported)    . Blood Pressure Monitor MISC Medium sized cuff. Use to check blood pressure twice per day and more often if not feeling well. I10.0 1 each 0  . calcium-vitamin D (OSCAL WITH D) 500-200 MG-UNIT tablet Take 1 tablet by mouth 3 (three) times daily. 90 tablet 1  . cetirizine (ZYRTEC) 10 MG tablet Take 1 tablet (10 mg total) by mouth daily. 90 tablet 3  . cyanocobalamin ([employer] VITAMIN B12) 2000 MCG tablet Take 1  tablet (2,000 mcg total) by mouth daily. 90 tablet 4  . famotidine (PEPCID) 20 MG tablet Take 1 tablet (20 mg total) by mouth 2 (two) times daily as needed for heartburn or indigestion. 30 tablet 3  . ferrous sulfate 325 (65 FE) MG EC tablet Take 1 tablet (325 mg total) by mouth 3 (three) times daily with meals. 90 tablet 3  . folic acid (FOLVITE) 1 MG tablet Take 1 tablet (1 mg total) by mouth daily. 90 tablet 4  . furosemide (LASIX) 20 MG tablet Take 1 tablet (20 mg total) by mouth daily. 90 tablet 4  . ketoconazole (NIZORAL) 2 % cream APPLY TO THE AFFECTED AREA(S) DAILY AS NEEDED 30 g 6  . methocarbamol (ROBAXIN) 500 MG tablet Take 500 mg by mouth every 6 (six) hours as needed. (Patient not taking: Reported on 08/17/2020)    . nystatin (MYCOSTATIN/NYSTOP) powder Apply 1 application topically as needed. 15 g 1  . palbociclib (IBRANCE) 75 MG tablet TAKE 1 TABLET (75 MG TOTAL) BY MOUTH DAILY. TAKE FOR 21 DAYS ON, 7 DAYS OFF, REPEAT EVERY 28 DAYS 21 tablet 6  . polyethylene glycol (MIRALAX / GLYCOLAX) 17 g packet Take 17 g by mouth as needed. 14 each 6  . sertraline  (ZOLOFT) 50 MG tablet Take 1.5 tablets (75 mg total) by mouth at bedtime. 90 day supply is 135 tablets 135 tablet 0  . ZOFRAN 8 MG tablet Take 1 tablet (8 mg total) by mouth every 8 (eight) hours. 20 tablet 1   No current facility-administered medications for this visit.    OBJECTIVE: White [name] examined in a wheelchair  Vitals:   09/19/20 1213  BP: 138/77  Pulse: 84  Resp: 16  Temp: (!) 97.2 F (36.2 C)  SpO2: 100%     Body mass index is 40.23 kg/m.   Wt Readings from Last 3 Encounters:  09/19/20 199 lb 3.2 oz (90.4 kg)  07/25/20 201 lb 12.8 oz (91.5 kg)  07/07/20 202 lb 4.8 oz (91.8 kg)  ECOG FS: 2  Sclerae unicteric, EOMs intact Wearing a mask No cervical or supraclavicular adenopathy Lungs no rales or rhonchi Heart regular rate and rhythm Abd soft, obese nontender, positive bowel sounds MSK no focal spinal tenderness, no upper extremity lymphedema Neuro: nonfocal, well oriented, appropriate affect Breasts: The right breast shows no evidence of erythema or peau d'orange.  The rash inframammary and on the chest wall is improved but certainly not resolved.   LAB RESULTS:  CMP     Component Value Date/Time   NA 144 09/19/2020 1125   K 3.9 09/19/2020 1125   CL 104 09/19/2020 1125   CO2 32 09/19/2020 1125   GLUCOSE 98 09/19/2020 1125   BUN 20 09/19/2020 1125   CREATININE 1.05 (H) 09/19/2020 1125   CREATININE 0.93 04/07/2020 1216   CALCIUM 9.9 09/19/2020 1125   PROT 6.8 09/19/2020 1125   ALBUMIN 3.6 09/19/2020 1125   AST 17 09/19/2020 1125   AST 16 04/07/2020 1216   ALT 8 09/19/2020 1125   ALT 6 04/07/2020 1216   ALKPHOS 106 09/19/2020 1125   BILITOT 0.4 09/19/2020 1125   BILITOT 0.5 04/07/2020 1216   GFRNONAA >60 09/19/2020 1125   GFRNONAA >60 04/07/2020 1216   GFRAA >60 02/24/2020 1454   GFRAA >60 07/24/2019 1453    No results found for: TOTALPROTELP, ALBUMINELP, A1GS, A2GS, BETS, BETA2SER, GAMS, MSPIKE, SPEI  No results found for: KPAFRELGTCHN,  LAMBDASER, KAPLAMBRATIO  Lab Results  Component Value  Date   WBC 3.6 (L) 09/19/2020   NEUTROABS 2.3 09/19/2020   HGB 9.0 (L) 09/19/2020   HCT 29.1 (L) 09/19/2020   MCV 114.1 (H) 09/19/2020   PLT 326 09/19/2020   No results found for: LABCA2  No components found for: WCHENI778  No results for input(s): INR in the last 168 hours.  No results found for: LABCA2  No results found for: EUM353  No results found for: IRW431  No results found for: VQM086  Lab Results  Component Value Date   CA2729 52.5 (H) 08/22/2020    No components found for: HGQUANT  No results found for: CEA1 / No results found for: CEA1   No results found for: AFPTUMOR  No results found for: CHROMOGRNA  No results found for: HGBA, HGBA2QUANT, HGBFQUANT, HGBSQUAN (Hemoglobinopathy evaluation)   Lab Results  Component Value Date   LDH 215 (H) 07/07/2020    Lab Results  Component Value Date   IRON 40 (L) 07/07/2020   TIBC 448 (H) 07/07/2020   IRONPCTSAT 9 (L) 07/07/2020   (Iron and TIBC)  Lab Results  Component Value Date   FERRITIN 21 07/07/2020    Urinalysis    Component Value Date/Time   COLORURINE YELLOW 04/07/2020 1216   APPEARANCEUR CLEAR 04/07/2020 1216   LABSPEC 1.026 04/07/2020 1216   PHURINE 5.0 04/07/2020 1216   GLUCOSEU NEGATIVE 04/07/2020 1216   HGBUR NEGATIVE 04/07/2020 1216   BILIRUBINUR NEGATIVE 04/07/2020 1216   KETONESUR NEGATIVE 04/07/2020 1216   PROTEINUR NEGATIVE 04/07/2020 1216   NITRITE NEGATIVE 04/07/2020 1216   LEUKOCYTESUR SMALL (A) 04/07/2020 1216    STUDIES:  No results found.   ELIGIBLE FOR AVAILABLE RESEARCH PROTOCOL: no   ASSESSMENT: 60 y.o. Suzanne [name], Suzanne [name] presenting 10/26/2018 with right-sided inflammatory breast cancer, stage IV, involvnh lungs, lymph nodes and bones, as follows:  (a) chest CT scan 10/26/2018 shows bilateral pleural effusions, possible lymphangitic spread of tumor, diffuse bony metastatic disease, and  significant axillary mediastinal and hilar adenopathy  (b) bone scan 10/27/2018 is a "near Bent" consistent with widespread bony metastatic disease  (c) head CT with and without contrast 10/30/2018 shows no intracranial metastatic disease, multiple calvarial lesions  (d) CA-27-29 on 10/27/2018 was 1810.4  (1) pleural fluid from right thoracentesis 10/28/2018 confirms malignant cells consistent with a breast primary, strongly estrogen and progesterone receptor positive, HER-2 not amplified, with an MIB-1 of 2%  (2) anastrozole started 10/29/2018  (a) palbociclib to start 11/13/2018 at 100 mg dose  (b) denosumab/xgeva started 11/13/2018   (3) associated problems:  (a) hypoxia secondary to effusions  (b) pain from bone lesions and left humeral and vertebral compression fractures  (c) right upper extremity lymphedema  (d) anemia, requiring transfusion  (e) poor venous access  (4) genetics testing on 12/25/2018 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 STK11, TP53, TSC1, TSC2, and VHL.  The following genes were evaluated for sequence changes only: SDHA and HOXB13 c.251G>A variant only. The [facility] Melanoma Panel analyzed the following 9 genes: BAP1 BRCA2 CDK4 CDKN2A MITF POT1 PTEN RB1 Tp53.   (  5) palliative radiation to the left humerus 07/20/2019 - 07/31/2019 Site Technique Total Dose (Gy) Dose per Fx (Gy) Completed Fx Beam Energies  Humerus, Left: Ext_Lt_humerus Complex 30/30 3 10/10  6X   (6) anemia requiring transfusion:   (a) B-12 level 134 FEB 2022: B-12 injectons started    PLAN: Suzanne [name] is now 2 years out from definitive diagnosis of metastatic breast cancer.  She continues to have an improving functional status, certainly back to her baseline.  She is tolerating treatment remarkably well.  She seemed to have an initial response to the B12 supplementation but she is now moderately anemic again.  This is certainly still better than before.  I think at this point it would be helpful to obtain a CT of the chest to get some measurable information.  We will try to get that done before the next treatment here  She knows to call for any other issue admittable before then  Total time 25 minutes. Sarajane Jews C. Caryssa Elzey, MD 09/19/20 12:19 PM Medical Oncology and Hematology [facility] [place], [place] [address] Tel. [phone]    Fax. [phone]   I, Wilburn Mylar, am acting as scribe for Dr. Virgie Dad. Taryll Reichenberger.  I, Lurline Del MD, have reviewed the above documentation for accuracy and completeness, and I agree with the above.   *Total Encounter Time as defined by the Centers for Medicare and Medicaid Services includes, in addition to the face-to-face time of a patient visit (documented in the note above) non-face-to-face time: obtaining and reviewing outside history, ordering and reviewing medications, tests or procedures, care coordination (communications with other health care professionals or caregivers) and documentation in the medical record.

## 2020-09-19 ENCOUNTER — Inpatient Hospital Stay: Payer: Medicaid Other

## 2020-09-19 ENCOUNTER — Inpatient Hospital Stay (HOSPITAL_BASED_OUTPATIENT_CLINIC_OR_DEPARTMENT_OTHER): Payer: Medicaid Other | Admitting: Oncology

## 2020-09-19 ENCOUNTER — Inpatient Hospital Stay: Payer: Medicaid Other | Attending: Adult Health

## 2020-09-19 ENCOUNTER — Other Ambulatory Visit: Payer: Self-pay

## 2020-09-19 VITALS — BP 138/77 | HR 84 | Temp 97.2°F | Resp 16 | Ht 59.0 in | Wt 199.2 lb

## 2020-09-19 DIAGNOSIS — C50911 Malignant neoplasm of unspecified site of right female breast: Secondary | ICD-10-CM | POA: Diagnosis not present

## 2020-09-19 DIAGNOSIS — D649 Anemia, unspecified: Secondary | ICD-10-CM

## 2020-09-19 DIAGNOSIS — C7800 Secondary malignant neoplasm of unspecified lung: Secondary | ICD-10-CM

## 2020-09-19 DIAGNOSIS — C7801 Secondary malignant neoplasm of right lung: Secondary | ICD-10-CM

## 2020-09-19 DIAGNOSIS — Z170 Estrogen receptor positive status [ER+]: Secondary | ICD-10-CM

## 2020-09-19 DIAGNOSIS — C50811 Malignant neoplasm of overlapping sites of right female breast: Secondary | ICD-10-CM

## 2020-09-19 DIAGNOSIS — C7951 Secondary malignant neoplasm of bone: Secondary | ICD-10-CM

## 2020-09-19 DIAGNOSIS — E538 Deficiency of other specified B group vitamins: Secondary | ICD-10-CM | POA: Diagnosis not present

## 2020-09-19 DIAGNOSIS — J90 Pleural effusion, not elsewhere classified: Secondary | ICD-10-CM

## 2020-09-19 DIAGNOSIS — R0602 Shortness of breath: Secondary | ICD-10-CM

## 2020-09-19 LAB — CBC WITH DIFFERENTIAL/PLATELET
Abs Immature Granulocytes: 0.02 10*3/uL (ref 0.00–0.07)
Basophils Absolute: 0.1 10*3/uL (ref 0.0–0.1)
Basophils Relative: 3 %
Eosinophils Absolute: 0.1 10*3/uL (ref 0.0–0.5)
Eosinophils Relative: 4 %
HCT: 29.1 % — ABNORMAL LOW (ref 36.0–46.0)
Hemoglobin: 9 g/dL — ABNORMAL LOW (ref 12.0–15.0)
Immature Granulocytes: 1 %
Lymphocytes Relative: 22 %
Lymphs Abs: 0.8 10*3/uL (ref 0.7–4.0)
MCH: 35.3 pg — ABNORMAL HIGH (ref 26.0–34.0)
MCHC: 30.9 g/dL (ref 30.0–36.0)
MCV: 114.1 fL — ABNORMAL HIGH (ref 80.0–100.0)
Monocytes Absolute: 0.2 10*3/uL (ref 0.1–1.0)
Monocytes Relative: 6 %
Neutro Abs: 2.3 10*3/uL (ref 1.7–7.7)
Neutrophils Relative %: 64 %
Platelets: 326 10*3/uL (ref 150–400)
RBC: 2.55 MIL/uL — ABNORMAL LOW (ref 3.87–5.11)
RDW: 21.1 % — ABNORMAL HIGH (ref 11.5–15.5)
WBC: 3.6 10*3/uL — ABNORMAL LOW (ref 4.0–10.5)
nRBC: 0 % (ref 0.0–0.2)

## 2020-09-19 LAB — COMPREHENSIVE METABOLIC PANEL
ALT: 8 U/L (ref 0–44)
AST: 17 U/L (ref 15–41)
Albumin: 3.6 g/dL (ref 3.5–5.0)
Alkaline Phosphatase: 106 U/L (ref 38–126)
Anion gap: 8 (ref 5–15)
BUN: 20 mg/dL (ref 8–23)
CO2: 32 mmol/L (ref 22–32)
Calcium: 9.9 mg/dL (ref 8.9–10.3)
Chloride: 104 mmol/L (ref 98–111)
Creatinine, Ser: 1.05 mg/dL — ABNORMAL HIGH (ref 0.44–1.00)
GFR, Estimated: >60 mL/min (ref >60)
Glucose, Bld: 98 mg/dL (ref 70–99)
Potassium: 3.9 mmol/L (ref 3.5–5.1)
Sodium: 144 mmol/L (ref 135–145)
Total Bilirubin: 0.4 mg/dL (ref 0.3–1.2)
Total Protein: 6.8 g/dL (ref 6.5–8.1)

## 2020-09-19 LAB — TYPE AND SCREEN
ABO/RH(D): O POS
Antibody Screen: NEGATIVE

## 2020-09-19 MED ORDER — CYANOCOBALAMIN 1000 MCG/ML IJ SOLN
1000.0000 ug | Freq: Once | INTRAMUSCULAR | Status: AC
  Administered 2020-09-19: 1000 ug via INTRAMUSCULAR

## 2020-09-19 MED ORDER — DENOSUMAB 120 MG/1.7ML ~~LOC~~ SOLN
120.0000 mg | Freq: Once | SUBCUTANEOUS | Status: AC
  Administered 2020-09-19: 120 mg via SUBCUTANEOUS

## 2020-09-19 MED ORDER — DENOSUMAB 120 MG/1.7ML ~~LOC~~ SOLN
SUBCUTANEOUS | Status: AC
  Filled 2020-09-19: qty 1.7

## 2020-09-19 MED ORDER — CYANOCOBALAMIN 1000 MCG/ML IJ SOLN
INTRAMUSCULAR | Status: AC
  Filled 2020-09-19: qty 1

## 2020-09-20 DIAGNOSIS — C50811 Malignant neoplasm of overlapping sites of right female breast: Secondary | ICD-10-CM | POA: Diagnosis not present

## 2020-09-20 LAB — CANCER ANTIGEN 27.29: CA 27.29: 42.7 U/mL — ABNORMAL HIGH (ref 0.0–38.6)

## 2020-09-21 DIAGNOSIS — C50811 Malignant neoplasm of overlapping sites of right female breast: Secondary | ICD-10-CM | POA: Diagnosis not present

## 2020-09-22 DIAGNOSIS — C50811 Malignant neoplasm of overlapping sites of right female breast: Secondary | ICD-10-CM | POA: Diagnosis not present

## 2020-09-23 DIAGNOSIS — C50811 Malignant neoplasm of overlapping sites of right female breast: Secondary | ICD-10-CM | POA: Diagnosis not present

## 2020-09-25 DIAGNOSIS — R0602 Shortness of breath: Secondary | ICD-10-CM | POA: Diagnosis not present

## 2020-09-25 DIAGNOSIS — S22000A Wedge compression fracture of unspecified thoracic vertebra, initial encounter for closed fracture: Secondary | ICD-10-CM | POA: Diagnosis not present

## 2020-09-25 DIAGNOSIS — C7801 Secondary malignant neoplasm of right lung: Secondary | ICD-10-CM | POA: Diagnosis not present

## 2020-09-25 DIAGNOSIS — J9621 Acute and chronic respiratory failure with hypoxia: Secondary | ICD-10-CM | POA: Diagnosis not present

## 2020-09-26 DIAGNOSIS — C50811 Malignant neoplasm of overlapping sites of right female breast: Secondary | ICD-10-CM | POA: Diagnosis not present

## 2020-09-27 DIAGNOSIS — C50811 Malignant neoplasm of overlapping sites of right female breast: Secondary | ICD-10-CM | POA: Diagnosis not present

## 2020-09-28 ENCOUNTER — Other Ambulatory Visit: Payer: Self-pay

## 2020-09-28 DIAGNOSIS — C50811 Malignant neoplasm of overlapping sites of right female breast: Secondary | ICD-10-CM | POA: Diagnosis not present

## 2020-09-28 NOTE — Patient Outreach (Signed)
Patient requested refill of Ketoconazole. Will deliver tomorrow.

## 2020-09-29 ENCOUNTER — Other Ambulatory Visit (HOSPITAL_COMMUNITY): Payer: Self-pay

## 2020-09-29 DIAGNOSIS — C50811 Malignant neoplasm of overlapping sites of right female breast: Secondary | ICD-10-CM | POA: Diagnosis not present

## 2020-09-29 MED FILL — Palbociclib Tab 75 MG: ORAL | 28 days supply | Qty: 21 | Fill #1 | Status: AC

## 2020-09-30 DIAGNOSIS — C50811 Malignant neoplasm of overlapping sites of right female breast: Secondary | ICD-10-CM | POA: Diagnosis not present

## 2020-10-03 DIAGNOSIS — C50811 Malignant neoplasm of overlapping sites of right female breast: Secondary | ICD-10-CM | POA: Diagnosis not present

## 2020-10-04 ENCOUNTER — Ambulatory Visit: Payer: Medicaid Other

## 2020-10-04 ENCOUNTER — Other Ambulatory Visit: Payer: Medicaid Other

## 2020-10-04 ENCOUNTER — Other Ambulatory Visit (HOSPITAL_COMMUNITY): Payer: Self-pay

## 2020-10-04 DIAGNOSIS — C50811 Malignant neoplasm of overlapping sites of right female breast: Secondary | ICD-10-CM | POA: Diagnosis not present

## 2020-10-05 DIAGNOSIS — C50811 Malignant neoplasm of overlapping sites of right female breast: Secondary | ICD-10-CM | POA: Diagnosis not present

## 2020-10-06 DIAGNOSIS — C50811 Malignant neoplasm of overlapping sites of right female breast: Secondary | ICD-10-CM | POA: Diagnosis not present

## 2020-10-06 DIAGNOSIS — S22000A Wedge compression fracture of unspecified thoracic vertebra, initial encounter for closed fracture: Secondary | ICD-10-CM | POA: Diagnosis not present

## 2020-10-07 DIAGNOSIS — C50811 Malignant neoplasm of overlapping sites of right female breast: Secondary | ICD-10-CM | POA: Diagnosis not present

## 2020-10-08 ENCOUNTER — Other Ambulatory Visit: Payer: Self-pay | Admitting: Oncology

## 2020-10-10 ENCOUNTER — Ambulatory Visit: Payer: Medicaid Other | Admitting: Nurse Practitioner

## 2020-10-10 ENCOUNTER — Other Ambulatory Visit: Payer: Self-pay

## 2020-10-10 NOTE — Patient Outreach (Signed)
Patient due for refill on  10/17/20  Medication  Anastrazole 1 mg       Cetirizine 10 mg  (Wants delivered 10/11/20)  Sertraline 50 mg   Melatonin ER 1 mg     Vitamin R44 ER 3154 mg   Folic Acid 1 mg     F/U 1 month

## 2020-10-11 DIAGNOSIS — C50811 Malignant neoplasm of overlapping sites of right female breast: Secondary | ICD-10-CM | POA: Diagnosis not present

## 2020-10-12 DIAGNOSIS — C50811 Malignant neoplasm of overlapping sites of right female breast: Secondary | ICD-10-CM | POA: Diagnosis not present

## 2020-10-13 DIAGNOSIS — C50811 Malignant neoplasm of overlapping sites of right female breast: Secondary | ICD-10-CM | POA: Diagnosis not present

## 2020-10-14 DIAGNOSIS — C50811 Malignant neoplasm of overlapping sites of right female breast: Secondary | ICD-10-CM | POA: Diagnosis not present

## 2020-10-17 ENCOUNTER — Inpatient Hospital Stay: Payer: Medicaid Other

## 2020-10-17 ENCOUNTER — Other Ambulatory Visit: Payer: Self-pay

## 2020-10-17 ENCOUNTER — Inpatient Hospital Stay: Payer: Medicaid Other | Attending: Adult Health

## 2020-10-17 VITALS — BP 98/81 | HR 85 | Temp 98.8°F | Resp 20

## 2020-10-17 DIAGNOSIS — C50911 Malignant neoplasm of unspecified site of right female breast: Secondary | ICD-10-CM | POA: Diagnosis present

## 2020-10-17 DIAGNOSIS — R0602 Shortness of breath: Secondary | ICD-10-CM

## 2020-10-17 DIAGNOSIS — C7951 Secondary malignant neoplasm of bone: Secondary | ICD-10-CM

## 2020-10-17 DIAGNOSIS — D519 Vitamin B12 deficiency anemia, unspecified: Secondary | ICD-10-CM | POA: Insufficient documentation

## 2020-10-17 DIAGNOSIS — C50811 Malignant neoplasm of overlapping sites of right female breast: Secondary | ICD-10-CM

## 2020-10-17 DIAGNOSIS — Z170 Estrogen receptor positive status [ER+]: Secondary | ICD-10-CM

## 2020-10-17 DIAGNOSIS — J90 Pleural effusion, not elsewhere classified: Secondary | ICD-10-CM

## 2020-10-17 DIAGNOSIS — C7801 Secondary malignant neoplasm of right lung: Secondary | ICD-10-CM

## 2020-10-17 DIAGNOSIS — D649 Anemia, unspecified: Secondary | ICD-10-CM

## 2020-10-17 LAB — COMPREHENSIVE METABOLIC PANEL
ALT: 7 U/L (ref 0–44)
AST: 15 U/L (ref 15–41)
Albumin: 3.3 g/dL — ABNORMAL LOW (ref 3.5–5.0)
Alkaline Phosphatase: 95 U/L (ref 38–126)
Anion gap: 6 (ref 5–15)
BUN: 16 mg/dL (ref 8–23)
CO2: 30 mmol/L (ref 22–32)
Calcium: 9.3 mg/dL (ref 8.9–10.3)
Chloride: 106 mmol/L (ref 98–111)
Creatinine, Ser: 0.97 mg/dL (ref 0.44–1.00)
GFR, Estimated: >60 mL/min (ref >60)
Glucose, Bld: 106 mg/dL — ABNORMAL HIGH (ref 70–99)
Potassium: 3.5 mmol/L (ref 3.5–5.1)
Sodium: 142 mmol/L (ref 135–145)
Total Bilirubin: 0.3 mg/dL (ref 0.3–1.2)
Total Protein: 6.5 g/dL (ref 6.5–8.1)

## 2020-10-17 LAB — CBC WITH DIFFERENTIAL/PLATELET
Abs Immature Granulocytes: 0.01 10*3/uL (ref 0.00–0.07)
Basophils Absolute: 0.1 10*3/uL (ref 0.0–0.1)
Basophils Relative: 2 %
Eosinophils Absolute: 0.1 10*3/uL (ref 0.0–0.5)
Eosinophils Relative: 2 %
HCT: 28.2 % — ABNORMAL LOW (ref 36.0–46.0)
Hemoglobin: 8.9 g/dL — ABNORMAL LOW (ref 12.0–15.0)
Immature Granulocytes: 0 %
Lymphocytes Relative: 26 %
Lymphs Abs: 0.9 10*3/uL (ref 0.7–4.0)
MCH: 37.1 pg — ABNORMAL HIGH (ref 26.0–34.0)
MCHC: 31.6 g/dL (ref 30.0–36.0)
MCV: 117.5 fL — ABNORMAL HIGH (ref 80.0–100.0)
Monocytes Absolute: 0.2 10*3/uL (ref 0.1–1.0)
Monocytes Relative: 5 %
Neutro Abs: 2.1 10*3/uL (ref 1.7–7.7)
Neutrophils Relative %: 65 %
Platelets: 312 10*3/uL (ref 150–400)
RBC: 2.4 MIL/uL — ABNORMAL LOW (ref 3.87–5.11)
RDW: 16 % — ABNORMAL HIGH (ref 11.5–15.5)
WBC: 3.3 10*3/uL — ABNORMAL LOW (ref 4.0–10.5)
nRBC: 0 % (ref 0.0–0.2)

## 2020-10-17 MED ORDER — CYANOCOBALAMIN 1000 MCG/ML IJ SOLN
INTRAMUSCULAR | Status: AC
  Filled 2020-10-17: qty 1

## 2020-10-17 MED ORDER — DENOSUMAB 120 MG/1.7ML ~~LOC~~ SOLN
SUBCUTANEOUS | Status: AC
  Filled 2020-10-17: qty 1.7

## 2020-10-17 MED ORDER — CYANOCOBALAMIN 1000 MCG/ML IJ SOLN
1000.0000 ug | Freq: Once | INTRAMUSCULAR | Status: AC
  Administered 2020-10-17: 1000 ug via INTRAMUSCULAR

## 2020-10-17 MED ORDER — DENOSUMAB 120 MG/1.7ML ~~LOC~~ SOLN
120.0000 mg | Freq: Once | SUBCUTANEOUS | Status: AC
  Administered 2020-10-17: 120 mg via SUBCUTANEOUS

## 2020-10-17 NOTE — Patient Instructions (Addendum)
Cyanocobalamin, Vitamin B12 injection What is this medicine? CYANOCOBALAMIN (sye an oh koe BAL a min) is a man made form of vitamin B12. Vitamin B12 is used in the growth of healthy blood cells, nerve cells, and proteins in the body. It also helps with the metabolism of fats and carbohydrates. This medicine is used to treat people who can not absorb vitamin B12. This medicine may be used for other purposes; ask your health care provider or pharmacist if you have questions. COMMON BRAND NAME(S): B-12 Compliance Kit, B-12 Injection Kit, Cyomin, LA-12, Nutri-Twelve, Physicians EZ Use B-12, Primabalt What should I tell my health care provider before I take this medicine? They need to know if you have any of these conditions:  kidney disease  Leber's disease  megaloblastic anemia  an unusual or allergic reaction to cyanocobalamin, cobalt, other medicines, foods, dyes, or preservatives  pregnant or trying to get pregnant  breast-feeding How should I use this medicine? This medicine is injected into a muscle or deeply under the skin. It is usually given by a health care professional in a clinic or doctor's office. However, your doctor may teach you how to inject yourself. Follow all instructions. Talk to your pediatrician regarding the use of this medicine in children. Special care may be needed. Overdosage: If you think you have taken too much of this medicine contact a poison control center or emergency room at once. NOTE: This medicine is only for you. Do not share this medicine with others. What if I miss a dose? If you are given your dose at a clinic or doctor's office, call to reschedule your appointment. If you give your own injections and you miss a dose, take it as soon as you can. If it is almost time for your next dose, take only that dose. Do not take double or extra doses. What may interact with this medicine?  colchicine  heavy alcohol intake This list may not describe all  possible interactions. Give your health care provider a list of all the medicines, herbs, non-prescription drugs, or dietary supplements you use. Also tell them if you smoke, drink alcohol, or use illegal drugs. Some items may interact with your medicine. What should I watch for while using this medicine? Visit your doctor or health care professional regularly. You may need blood work done while you are taking this medicine. You may need to follow a special diet. Talk to your doctor. Limit your alcohol intake and avoid smoking to get the best benefit. What side effects may I notice from receiving this medicine? Side effects that you should report to your doctor or health care professional as soon as possible:  allergic reactions like skin rash, itching or hives, swelling of the face, lips, or tongue  blue tint to skin  chest tightness, pain  difficulty breathing, wheezing  dizziness  red, swollen painful area on the leg Side effects that usually do not require medical attention (report to your doctor or health care professional if they continue or are bothersome):  diarrhea  headache This list may not describe all possible side effects. Call your doctor for medical advice about side effects. You may report side effects to FDA at 1-800-FDA-1088. Where should I keep my medicine? Keep out of the reach of children. Store at room temperature between 15 and 30 degrees C (59 and 85 degrees F). Protect from light. Throw away any unused medicine after the expiration date. NOTE: This sheet is a summary. It may not cover  all possible information. If you have questions about this medicine, talk to your doctor, pharmacist, or health care provider.  2021 Elsevier/Gold Standard (2007-08-25 22:10:20)  Denosumab injection What is this medicine? DENOSUMAB (den oh sue mab) slows bone breakdown. Prolia is used to treat osteoporosis in women after menopause and in men, and in people who are taking  corticosteroids for 6 months or more. Delton See is used to treat a high calcium level due to cancer and to prevent bone fractures and other bone problems caused by multiple myeloma or cancer bone metastases. Delton See is also used to treat giant cell tumor of the bone. This medicine may be used for other purposes; ask your health care provider or pharmacist if you have questions. COMMON BRAND NAME(S): Prolia, XGEVA What should I tell my health care provider before I take this medicine? They need to know if you have any of these conditions:  dental disease  having surgery or tooth extraction  infection  kidney disease  low levels of calcium or Vitamin D in the blood  malnutrition  on hemodialysis  skin conditions or sensitivity  thyroid or parathyroid disease  an unusual reaction to denosumab, other medicines, foods, dyes, or preservatives  pregnant or trying to get pregnant  breast-feeding How should I use this medicine? This medicine is for injection under the skin. It is given by a health care professional in a hospital or clinic setting. A special MedGuide will be given to you before each treatment. Be sure to read this information carefully each time. For Prolia, talk to your pediatrician regarding the use of this medicine in children. Special care may be needed. For Delton See, talk to your pediatrician regarding the use of this medicine in children. While this drug may be prescribed for children as young as 13 years for selected conditions, precautions do apply. Overdosage: If you think you have taken too much of this medicine contact a poison control center or emergency room at once. NOTE: This medicine is only for you. Do not share this medicine with others. What if I miss a dose? It is important not to miss your dose. Call your doctor or health care professional if you are unable to keep an appointment. What may interact with this medicine? Do not take this medicine with any of the  following medications:  other medicines containing denosumab This medicine may also interact with the following medications:  medicines that lower your chance of fighting infection  steroid medicines like prednisone or cortisone This list may not describe all possible interactions. Give your health care provider a list of all the medicines, herbs, non-prescription drugs, or dietary supplements you use. Also tell them if you smoke, drink alcohol, or use illegal drugs. Some items may interact with your medicine. What should I watch for while using this medicine? Visit your doctor or health care professional for regular checks on your progress. Your doctor or health care professional may order blood tests and other tests to see how you are doing. Call your doctor or health care professional for advice if you get a fever, chills or sore throat, or other symptoms of a cold or flu. Do not treat yourself. This drug may decrease your body's ability to fight infection. Try to avoid being around people who are sick. You should make sure you get enough calcium and vitamin D while you are taking this medicine, unless your doctor tells you not to. Discuss the foods you eat and the vitamins you take with  your health care professional. See your dentist regularly. Brush and floss your teeth as directed. Before you have any dental work done, tell your dentist you are receiving this medicine. Do not become pregnant while taking this medicine or for 5 months after stopping it. Talk with your doctor or health care professional about your birth control options while taking this medicine. Women should inform their doctor if they wish to become pregnant or think they might be pregnant. There is a potential for serious side effects to an unborn child. Talk to your health care professional or pharmacist for more information. What side effects may I notice from receiving this medicine? Side effects that you should report to  your doctor or health care professional as soon as possible:  allergic reactions like skin rash, itching or hives, swelling of the face, lips, or tongue  bone pain  breathing problems  dizziness  jaw pain, especially after dental work  redness, blistering, peeling of the skin  signs and symptoms of infection like fever or chills; cough; sore throat; pain or trouble passing urine  signs of low calcium like fast heartbeat, muscle cramps or muscle pain; pain, tingling, numbness in the hands or feet; seizures  unusual bleeding or bruising  unusually weak or tired Side effects that usually do not require medical attention (report to your doctor or health care professional if they continue or are bothersome):  constipation  diarrhea  headache  joint pain  loss of appetite  muscle pain  runny nose  tiredness  upset stomach This list may not describe all possible side effects. Call your doctor for medical advice about side effects. You may report side effects to FDA at 1-800-FDA-1088. Where should I keep my medicine? This medicine is only given in a clinic, doctor's office, or other health care setting and will not be stored at home. NOTE: This sheet is a summary. It may not cover all possible information. If you have questions about this medicine, talk to your doctor, pharmacist, or health care provider.  2021 Elsevier/Gold Standard (2017-09-20 16:10:44)

## 2020-10-18 ENCOUNTER — Ambulatory Visit: Payer: Self-pay

## 2020-10-18 DIAGNOSIS — C50811 Malignant neoplasm of overlapping sites of right female breast: Secondary | ICD-10-CM | POA: Diagnosis not present

## 2020-10-18 LAB — CANCER ANTIGEN 27.29: CA 27.29: 42.2 U/mL — ABNORMAL HIGH (ref 0.0–38.6)

## 2020-10-19 DIAGNOSIS — C50811 Malignant neoplasm of overlapping sites of right female breast: Secondary | ICD-10-CM | POA: Diagnosis not present

## 2020-10-20 ENCOUNTER — Other Ambulatory Visit (HOSPITAL_COMMUNITY): Payer: Self-pay

## 2020-10-20 DIAGNOSIS — C50811 Malignant neoplasm of overlapping sites of right female breast: Secondary | ICD-10-CM | POA: Diagnosis not present

## 2020-10-21 DIAGNOSIS — C50811 Malignant neoplasm of overlapping sites of right female breast: Secondary | ICD-10-CM | POA: Diagnosis not present

## 2020-10-24 DIAGNOSIS — C50811 Malignant neoplasm of overlapping sites of right female breast: Secondary | ICD-10-CM | POA: Diagnosis not present

## 2020-10-25 ENCOUNTER — Other Ambulatory Visit (HOSPITAL_COMMUNITY): Payer: Self-pay

## 2020-10-25 DIAGNOSIS — C50811 Malignant neoplasm of overlapping sites of right female breast: Secondary | ICD-10-CM | POA: Diagnosis not present

## 2020-10-25 MED FILL — Palbociclib Tab 75 MG: ORAL | 28 days supply | Qty: 21 | Fill #2 | Status: AC

## 2020-10-26 ENCOUNTER — Other Ambulatory Visit: Payer: Self-pay

## 2020-10-26 ENCOUNTER — Other Ambulatory Visit: Payer: Self-pay | Admitting: *Deleted

## 2020-10-26 DIAGNOSIS — J9621 Acute and chronic respiratory failure with hypoxia: Secondary | ICD-10-CM | POA: Diagnosis not present

## 2020-10-26 DIAGNOSIS — S22000A Wedge compression fracture of unspecified thoracic vertebra, initial encounter for closed fracture: Secondary | ICD-10-CM | POA: Diagnosis not present

## 2020-10-26 DIAGNOSIS — C50811 Malignant neoplasm of overlapping sites of right female breast: Secondary | ICD-10-CM | POA: Diagnosis not present

## 2020-10-26 DIAGNOSIS — R0602 Shortness of breath: Secondary | ICD-10-CM | POA: Diagnosis not present

## 2020-10-26 DIAGNOSIS — C7801 Secondary malignant neoplasm of right lung: Secondary | ICD-10-CM | POA: Diagnosis not present

## 2020-10-26 NOTE — Patient Instructions (Signed)
Visit Information  Suzanne [name] was given information about Medicaid Managed Care team care coordination services as a part of their [place] Medicaid benefit. Suzanne [name] verbally consented to engagement with the [facility] Managed Care team.   For questions related to your [facility], please call: [phone] or visit the homepage here: [email or web address]  If you would like to schedule transportation through your [facility], please call the following number at least 2 days in advance of your appointment: [phone].   Call the [facility] at [phone], at any time, 24 hours a day, 7 days a week. If you are in danger or need immediate medical attention call 911.  Suzanne [name] - following are the goals we discussed in your visit today:  Goals Addressed            This Visit's Progress   . COMPLETED: Find Help in My Community       Timeframe:  Long-Range Goal Priority:  High Start Date:  07/14/20                           Expected End Date:  10/26/20                   Follow Up Date 10/26/20   - begin a notebook of services in my neighborhood or community - call 211 when I need some help - follow-up on any referrals for help I am given - have a back-up plan - make a list of family or friends that I can call    Why is this important?    Knowing how and where to find help for yourself or family in your neighborhood and community is an important skill.   You will want to take some steps to learn how.        . COMPLETED: Make and Keep All Appointments       Timeframe:  Long-Range Goal Priority:  High Start Date:   07/14/20                          Expected End Date:   10/26/20                    Follow Up Date 10/18/20    - call [facility] [phone] to schedule a dental exam - call [phone] for medical  transportation provided by [facility] - call to cancel if needed - keep a calendar with prescription refill dates - keep a calendar with appointment dates    Why is this important?    Part of staying healthy is seeing the doctor for follow-up care.   If you forget your appointments, there are some things you can do to stay on track.           Please see education materials related to exercise provided by MyChart link.    Exercises to do While Sitting  Exercises that you do while sitting (chair exercises) can give you many of the same benefits as full exercise. Benefits include strengthening your heart, burning calories, and keeping muscles and joints healthy. Exercise can also improve your mood and help with depression and anxiety. You may benefit from chair exercises if you are unable to do standing exercises because of:  Diabetic foot pain.  Obesity.  Illness.  Arthritis.  Recovery from surgery  or injury.  Breathing problems.  Balance problems.  Another type of disability. Before starting chair exercises, check with your health care provider or a physical therapist to find out how much exercise you can tolerate and which exercises are safe for you. If your health care provider approves:  Start out slowly and build up over time. Aim to work up to about 10-20 minutes for each exercise session.  Make exercise part of your daily routine.  Drink water when you exercise. Do not wait until you are thirsty. Drink every 10-15 minutes.  Stop exercising right away if you have pain, nausea, shortness of breath, or dizziness.  If you are exercising in a wheelchair, make sure to lock the wheels.  Ask your health care provider whether you can do tai chi or yoga. Many positions in these mind-body exercises can be modified to do while seated. Warm-up Before starting other exercises: 1. Sit up as straight as you can. Have your knees bent at 90 degrees, which is the shape of  the capital letter "L." Keep your feet flat on the floor. 2. Sit at the front edge of your chair, if you can. 3. Pull in (tighten) the muscles in your abdomen and stretch your spine and neck as straight as you can. Hold this position for a few minutes. 4. Breathe in and out evenly. Try to concentrate on your breathing, and relax your mind. Stretching Exercise A: Arm stretch 1. Hold your arms out straight in front of your body. 2. Bend your hands at the wrist with your fingers pointing up, as if signaling someone to stop. Notice the slight tension in your forearms as you hold the position. 3. Keeping your arms out and your hands bent, rotate your hands outward as far as you can and hold this stretch. Aim to have your thumbs pointing up and your pinkie fingers pointing down. Slowly repeat arm stretches for one minute as tolerated. Exercise B: Leg stretch 1. If you can move your legs, try to "draw" letters on the floor with the toes of your foot. Write your name with one foot. 2. Write your name with the toes of your other foot. Slowly repeat the movements for one minute as tolerated. Exercise C: Reach for the sky 1. Reach your hands as far over your head as you can to stretch your spine. 2. Move your hands and arms as if you are climbing a rope. Slowly repeat the movements for one minute as tolerated. Range of motion exercises Exercise A: Shoulder roll 1. Let your arms hang loosely at your sides. 2. Lift just your shoulders up toward your ears, then let them relax back down. 3. When your shoulders feel loose, rotate your shoulders in backward and forward circles. Do shoulder rolls slowly for one minute as tolerated. Exercise B: March in place 1. As if you are marching, pump your arms and lift your legs up and down. Lift your knees as high as you can. ? If you are unable to lift your knees, just pump your arms and move your ankles and feet up and down. March in place for one minute as  tolerated. Exercise C: Seated jumping jacks 1. Let your arms hang down straight. 2. Keeping your arms straight, lift them up over your head. Aim to point your fingers to the ceiling. 3. While you lift your arms, straighten your legs and slide your heels along the floor to your sides, as wide as you can. 4. As you  bring your arms back down to your sides, slide your legs back together. ? If you are unable to use your legs, just move your arms. Slowly repeat seated jumping jacks for one minute as tolerated. Strengthening exercises Exercise A: Shoulder squeeze 1. Hold your arms straight out from your body to your sides, with your elbows bent and your fists pointed at the ceiling. 2. Keeping your arms in the bent position, move them forward so your elbows and forearms meet in front of your face. 3. Open your arms back out as wide as you can with your elbows still bent, until you feel your shoulder blades squeezing together. Hold for 5 seconds. Slowly repeat the movements forward and backward for one minute as tolerated. Contact a health care provider if you:  Had to stop exercising due to any of the following: ? Pain. ? Nausea. ? Shortness of breath. ? Dizziness. ? Fatigue.  Have significant pain or soreness after exercising. Get help right away if you have:  Chest pain.  Difficulty breathing. These symptoms may represent a serious problem that is an emergency. Do not wait to see if the symptoms will go away. Get medical help right away. Call your local emergency services (911 in the [place] Do not drive yourself to the hospital. This information is not intended to replace advice given to you by your health care provider. Make sure you discuss any questions you have with your health care provider. Document Revised: 09/10/2019 Document Reviewed: 09/10/2019 Elsevier Patient Education  2021 [place].   Patient verbalizes understanding of instructions provided today.   Telephone  follow up appointment with Managed Medicaid care management team member scheduled for:01/26/21 @ 2:30pm  [name] RN, BSN Ricardo RN Care Coordinator   Following is a copy of your plan of care:  Patient Care Plan: General Plan of Care (Adult)    Problem Identified: Health Promotion or Disease Self-Management (General Plan of Care)     Long-Range Goal: Self-Management Plan Developed Completed 10/26/2020  Start Date: 07/14/2020  Expected End Date: 10/26/2020  This Visit's Progress: On track  Recent Progress: On track  Priority: High  Note:   Current Barriers:  . Care Coordination needs related to managing breast cancer treatment. Suzanne [name] is being treated for stage IV breast cancer. She and her daughter live together. Suzanne [name] is unable to drive and her daughter does not drive. This makes it difficult to get groceries and OTC medications. They have had groceries delivered, which is very expensive after paying the extra fees. Suzanne [name] is able to walk around in the house, but requires a wheelchair when going to the [facility] for appointments. Her wheelchair is missing a screw on the leg support and the brake is not working correctly. They have called [employer] for assistance with fixing the wheelchair and were told they would need to bring the wheelchair to the office, which is in [facility]. RNCM following up on wheelchair. Patient has not been contacted by [employer]. Suzanne [name] reports receiving a new wheelchair from [facility]. She has been working with Suzanne [name] and now her medications are being delivered, which is a big help. Suzanne [name] is needing to schedule a dental exam and needs a list of providers accepting Medicaid. She has not had a chance to follow up on resources received from [facility], but plans to do so. Update-Suzanne [name] has not had a chance to schedule a dental exam or apply for food benefits.  She has plans to apply this week. She is getting PCS M-F for 2-2.5  hours a day, which is helpful. Patient has no other needs at this time. . Film/video editor.  . Transportation barriers . Difficulty obtaining medications . Food Insecurities  Nurse Case Manager Clinical Goal(s):  . patient will work with [facility] to address needs related to procuring groceries and assistance applying for food benefits-[facility] . patient will meet with RN Care Manager to address barriers to managing her healthcare at home-Met . patient will work with CM team pharmacist for medication management-Met  Interventions:  . Inter-disciplinary care team collaboration (see longitudinal plan of care) . Evaluation of current treatment plan related to anemia and patient's adherence to plan as established by provider. . Reviewed medications with patient and discussed iron supplements . Collaborated with Suzanne [name] regarding patient having medication questions . Discussed plans with patient for ongoing care management follow up and provided patient with direct contact information for care management team . Provided patient with MyChart educational materials related to iron-rich foods . Provided patient with Dental office accepting Medicaid-[facility] [phone] . BSW spoke with patient. BSW provided patient with the website epass.[email or web address].cz to apply for foodstamps online and the phone number for Aging and Disability Services [phone]. Marland Kitchen Update: BSW spoke with patient to follow up on resources provided. Patient stated she has not had the chance to contact the resources yet, but plans on doing so soon. Patient stated no other resources were needed at this time.  Patient Goals/Self-Care Activities - begin a notebook of services in my neighborhood or community - call 211 when I need some help - follow-up on any referrals for help I am given - have a back-up plan - make a list of family or friends that I can call - call [facility] [phone] to schedule a  dental exam - call [phone] for medical transportation provided by [facility] - call to cancel if needed - keep a calendar with prescription refill dates - keep a calendar with appointment dates    Follow Up Plan: Telephone follow up appointment with Managed Medicaid care management team member scheduled for:01/26/21 @ 2:30pm      Patient Care Plan: Medication Management    Problem Identified: Health Promotion or Disease Self-Management (General Plan of Care)     Goal: Medication Management   Note:   Evidence-based guidance:   Review biopsychosocial determinants of health screens.   Determine level of modifiable health risk.   Assess level of patient activation, level of readiness, importance and confidence to make changes.   Evoke change talk using open-ended questions, pros and cons, as well as looking forward.   Identify areas where behavior change may lead to improved health.   Partner with patient to develop a robust self-management plan that includes lifestyle factors, such as weight loss, exercise and healthy nutrition, as well as goals specific to disease risks.   Support patient and family/caregiver active participation in decision-making and self-management plan.   Implement additional goals and interventions based on identified risk factors to reduce health risk.   Facilitate advance care planning.   Review need for preventive screening based on age, sex, family history and health history.   Notes:

## 2020-10-26 NOTE — Patient Outreach (Signed)
Medicaid Managed Care   Nurse Care Manager Note  10/26/2020 Name:  Suzanne [name] MRN:  [id number] DOB:  10-20-1960  Suzanne [name] is an 60 y.o. year old female who is a primary patient of Suzanne Pounds, NP.  The [facility] Managed Care Coordination team was consulted for assistance with:    Breast Cancer  Ms. Ehly was given information about Medicaid Managed Care Coordination team services today. Suzanne [name] agreed to services and verbal consent obtained.  Engaged with patient by telephone for follow up visit in response to provider referral for case management and/or care coordination services.   Assessments/Interventions:  Review of past medical history, allergies, medications, health status, including review of consultants reports, laboratory and other test data, was performed as part of comprehensive evaluation and provision of chronic care management services.  SDOH (Social Determinants of Health) assessments and interventions performed:   Care Plan  No Known Allergies  Medications Reviewed Today    Reviewed by Melissa Montane, RN (Registered Nurse) on 08/17/20 at 1447  Med List Status: <None>  Medication Order Taking? Sig Documenting Provider Last Dose Status Informant  anastrozole (ARIMIDEX) 1 MG tablet [id number] Yes Take 1 tablet (1 mg total) by mouth daily. [name], Suzanne Dad, MD Taking Active   antiseptic oral rinse (BIOTENE) LIQD [id number]  15 mLs by Mouth Rinse route as needed for dry mouth.  Patient not taking: No sig reported   [provider]  Active   Blood Pressure Monitor MISC [id number]  Medium sized cuff. Use to check blood pressure twice per day and more often if not feeling well. I10.0 Suzanne Pounds, NP  Active   calcium-vitamin D (OSCAL WITH D) 500-200 MG-UNIT tablet [id number] Yes Take 1 tablet by mouth 3 (three) times daily. [name], Suzanne Dad, MD Taking Active   cetirizine (ZYRTEC) 10 MG tablet [id number] Yes Take 1 tablet (10 mg total) by  mouth daily. Suzanne Pounds, NP Taking Active   cyanocobalamin ([employer] VITAMIN B12) 2000 MCG tablet [id number] Yes Take 1 tablet (2,000 mcg total) by mouth daily. [name], Suzanne Dad, MD Taking Active   famotidine (PEPCID) 20 MG tablet [id number] Yes Take 1 tablet (20 mg total) by mouth 2 (two) times daily as needed for heartburn or indigestion. [name], Suzanne Dad, MD Taking Active   ferrous sulfate 325 (65 FE) MG EC tablet [id number] Yes Take 1 tablet (325 mg total) by mouth 3 (three) times daily with meals. [name], Suzanne Dad, MD Taking Active            Med Note Suzanne [name], Alix Lahmann A   Wed Aug 17, 2020  2:44 PM) Taking once daily  folic acid (FOLVITE) 1 MG tablet [id number] Yes Take 1 tablet (1 mg total) by mouth daily. [name], Suzanne Dad, MD Taking Active   furosemide (LASIX) 20 MG tablet [id number] Yes Take 1 tablet (20 mg total) by mouth daily. [name], Suzanne Dad, MD Taking Active   ketoconazole (NIZORAL) 2 % cream [id number] Yes Apply to affected areas daily as needed [name], Suzanne Dad, MD Taking Active   methocarbamol (ROBAXIN) 500 MG tablet [id number] No Take 500 mg by mouth every 6 (six) hours as needed.  Patient not taking: Reported on 08/17/2020   [provider] Not Taking Active   nystatin (MYCOSTATIN/NYSTOP) powder [id number] Yes Apply 1 application topically as needed. Gardenia Phlegm, NP Taking Active   palbociclib [facility]) 75 MG tablet [id number] Yes Take 1 tablet (75 mg total) by mouth daily. TAKE  1 TABLET (100 MG TOTAL) BY MOUTH DAILY. TAKE FOR 21 DAYS ON, 7 DAYS OFF, REPEAT EVERY 28 DAYS. [name], Suzanne Dad, MD Taking Active            Med Note Suzanne [name]   Fri Jul 08, 2020  3:28 PM) Told to restart on Monday by Oncologist  polyethylene glycol (MIRALAX / GLYCOLAX) 17 g packet [id number] Yes Take 17 g by mouth as needed. [name], Suzanne Dad, MD Taking Active   sertraline (ZOLOFT) 50 MG tablet [id number] Yes Take 1.5 tablets (75 mg total) by mouth at bedtime. 90 day supply  is 135 tablets Suzanne Pounds, NP Taking Active   ZOFRAN 8 MG tablet [id number] Yes Take 1 tablet (8 mg total) by mouth every 8 (eight) hours. Gardenia Phlegm, NP Taking Active   Med List Note Suzanne [name], RPH-CPP 11/13/19 1219): [name] filled at [place]          Patient Active Problem List   Diagnosis Date Noted  . B12 deficiency anemia 08/15/2020  . Intractable back pain 02/03/2019  . Thoracic compression fracture, closed, initial encounter ([place]) 02/03/2019  . Essential hypertension 02/03/2019  . Genetic testing 01/27/2019  . Acute pharyngitis   . Bacteremia   . FTT (failure to thrive) in adult   . Fracture closed, humerus, shaft 01/09/2019  . Pneumonia 12/25/2018  . Acute respiratory disorder in immunocompromised patient ([place]) 12/25/2018  . Family history of breast cancer   . Family history of prostate cancer   . Family history of melanoma   . Family history of colon cancer   . Pressure injury of skin 11/24/2018  . Chronic respiratory failure with hypoxia and hypercapnia ([place]) 11/19/2018  . 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 Hypophosphatemia 11/19/2018  . Hypocalcemia 11/19/2018  . Aspiration pneumonia ([place]) 11/19/2018  . SOB (shortness of breath)   . Palliative care by specialist   . Carcinoma of breast, estrogen receptor positive, stage 4, right ([place]) 11/16/2018  . Hypoalbuminemia 11/16/2018  . Pleural effusion 11/14/2018  . Goals of care, counseling/discussion 11/06/2018  . Malignant neoplasm of overlapping sites of right breast in female, estrogen receptor positive ([place]) 10/31/2018  . Lung metastasis ([place]) 10/31/2018  . Pain from bone metastases ([place]) 10/31/2018  . Morbid obesity with BMI of 40.0-44.9, adult ([place]) 10/31/2018  . Bone injury   . Pleural effusion, bilateral   . Shortness of breath   . Abnormal breast finding   . Hypokalemia   . Breast skin changes   . Macrocytic anemia   . Bone metastasis ([place])   . Anemia,  B12 deficiency 10/26/2018    Conditions to be addressed/monitored per PCP order:  Breast Cancer  Care Plan : General Plan of Care (Adult)  Updates made by Melissa Montane, RN since 10/26/2020 12:00 AM    Problem: Health Promotion or Disease Self-Management (General Plan of Care)     Long-Range Goal: Self-Management Plan Developed Completed 10/26/2020  Start Date: 07/14/2020  Expected End Date: 10/26/2020  This Visit's Progress: On track  Recent Progress: On track  Priority: High  Note:   Current Barriers:  . Care Coordination needs related to managing breast cancer treatment. Ms. Knipfer is being treated for stage IV breast cancer. She and her daughter live together. Ms. Silverio is unable to drive and her daughter does not drive. This makes it difficult to get groceries and OTC medications. They have had groceries delivered, which is very expensive after paying the extra fees.  Ms Mooty is able to walk around in the house, but requires a wheelchair when going to the [facility] for appointments. Her wheelchair is missing a screw on the leg support and the brake is not working correctly. They have called [employer] for assistance with fixing the wheelchair and were told they would need to bring the wheelchair to the office, which is in [facility]. RNCM following up on wheelchair. Patient has not been contacted by [employer]. Ms. Brownrigg reports receiving a new wheelchair from [facility]. She has been working with Ovid Curd and now her medications are being delivered, which is a big help. Ms. Seaberry is needing to schedule a dental exam and needs a list of providers accepting Medicaid. She has not had a chance to follow up on resources received from [place], but plans to do so. Update-Ms. Gasner has not had a chance to schedule a dental exam or apply for food benefits. She has plans to apply this week. She is getting PCS M-F for 2-2.5 hours a day, which is helpful. Patient has no other needs at this time. . Museum/gallery exhibitions officer.  . Transportation barriers . Difficulty obtaining medications . Food Insecurities  Nurse Case Manager Clinical Goal(s):  . patient will work with [place] to address needs related to procuring groceries and assistance applying for food benefits-[facility] . patient will meet with RN Care Manager to address barriers to managing her healthcare at home-Met . patient will work with CM team pharmacist for medication management-Met  Interventions:  . Inter-disciplinary care team collaboration (see longitudinal plan of care) . Evaluation of current treatment plan related to anemia and patient's adherence to plan as established by provider. . Reviewed medications with patient and discussed iron supplements . Collaborated with Ovid Curd regarding patient having medication questions . Discussed plans with patient for ongoing care management follow up and provided patient with direct contact information for care management team . Provided patient with MyChart educational materials related to iron-rich foods . Provided patient with Dental office accepting Medicaid-[facility] [phone] . BSW spoke with patient. BSW provided patient with the website epass.[employer].cz to apply for foodstamps online and the phone number for [facility] and Disability Services [phone]. Marland Kitchen Update: BSW spoke with patient to follow up on resources provided. Patient stated she has not had the chance to contact the resources yet, but plans on doing so soon. Patient stated no other resources were needed at this time.  Patient Goals/Self-Care Activities - begin a notebook of services in my neighborhood or community - call 211 when I need some help - follow-up on any referrals for help I am given - have a back-up plan - make a list of family or friends that I can call - call [facility] [phone] to schedule a dental exam - call [phone] for medical transportation provided by [facility] - call to cancel if needed - keep a calendar with prescription refill dates - keep a calendar with appointment dates    Follow Up Plan: Telephone follow up appointment with Managed Medicaid care management team member scheduled for 01/26/21 @ 2:30pm        Follow Up:  Patient agrees to Care Plan and Follow-up.  Plan: The Managed Medicaid care management team will reach out to the patient again over the next 90 days.  Date/time of next scheduled RN care management/care coordination outreach: 01/26/21 @ 2:30pm  [name] RN, [place] RN Care Coordinator

## 2020-10-27 ENCOUNTER — Other Ambulatory Visit (HOSPITAL_COMMUNITY): Payer: Self-pay

## 2020-10-27 DIAGNOSIS — C50811 Malignant neoplasm of overlapping sites of right female breast: Secondary | ICD-10-CM | POA: Diagnosis not present

## 2020-10-31 ENCOUNTER — Other Ambulatory Visit: Payer: Self-pay

## 2020-10-31 DIAGNOSIS — C50811 Malignant neoplasm of overlapping sites of right female breast: Secondary | ICD-10-CM | POA: Diagnosis not present

## 2020-10-31 NOTE — Patient Outreach (Signed)
Patient requesting refill of Ketoconazole, will deliver 11/01/20

## 2020-11-01 DIAGNOSIS — C50811 Malignant neoplasm of overlapping sites of right female breast: Secondary | ICD-10-CM | POA: Diagnosis not present

## 2020-11-02 DIAGNOSIS — C50811 Malignant neoplasm of overlapping sites of right female breast: Secondary | ICD-10-CM | POA: Diagnosis not present

## 2020-11-03 DIAGNOSIS — C50811 Malignant neoplasm of overlapping sites of right female breast: Secondary | ICD-10-CM | POA: Diagnosis not present

## 2020-11-04 DIAGNOSIS — C50811 Malignant neoplasm of overlapping sites of right female breast: Secondary | ICD-10-CM | POA: Diagnosis not present

## 2020-11-06 DIAGNOSIS — S22000A Wedge compression fracture of unspecified thoracic vertebra, initial encounter for closed fracture: Secondary | ICD-10-CM | POA: Diagnosis not present

## 2020-11-07 DIAGNOSIS — C50811 Malignant neoplasm of overlapping sites of right female breast: Secondary | ICD-10-CM | POA: Diagnosis not present

## 2020-11-08 DIAGNOSIS — C50811 Malignant neoplasm of overlapping sites of right female breast: Secondary | ICD-10-CM | POA: Diagnosis not present

## 2020-11-09 ENCOUNTER — Other Ambulatory Visit: Payer: Medicaid Other

## 2020-11-09 DIAGNOSIS — C50811 Malignant neoplasm of overlapping sites of right female breast: Secondary | ICD-10-CM | POA: Diagnosis not present

## 2020-11-09 NOTE — Patient Outreach (Signed)
Patient due for Cetirizine and Furosemide, needs refills ASAP  Will get delivered tomorrow

## 2020-11-10 DIAGNOSIS — C50811 Malignant neoplasm of overlapping sites of right female breast: Secondary | ICD-10-CM | POA: Diagnosis not present

## 2020-11-11 DIAGNOSIS — C50811 Malignant neoplasm of overlapping sites of right female breast: Secondary | ICD-10-CM | POA: Diagnosis not present

## 2020-11-13 NOTE — Progress Notes (Signed)
x

## 2020-11-14 ENCOUNTER — Inpatient Hospital Stay: Payer: Medicaid Other

## 2020-11-14 ENCOUNTER — Inpatient Hospital Stay (HOSPITAL_BASED_OUTPATIENT_CLINIC_OR_DEPARTMENT_OTHER): Payer: Medicaid Other | Admitting: Oncology

## 2020-11-14 ENCOUNTER — Other Ambulatory Visit: Payer: Self-pay

## 2020-11-14 ENCOUNTER — Inpatient Hospital Stay: Payer: Medicaid Other | Attending: Adult Health

## 2020-11-14 ENCOUNTER — Telehealth: Payer: Self-pay

## 2020-11-14 VITALS — BP 138/77 | HR 83 | Temp 97.7°F | Resp 18 | Wt 198.3 lb

## 2020-11-14 DIAGNOSIS — C7951 Secondary malignant neoplasm of bone: Secondary | ICD-10-CM

## 2020-11-14 DIAGNOSIS — E538 Deficiency of other specified B group vitamins: Secondary | ICD-10-CM | POA: Diagnosis not present

## 2020-11-14 DIAGNOSIS — C7802 Secondary malignant neoplasm of left lung: Secondary | ICD-10-CM | POA: Diagnosis not present

## 2020-11-14 DIAGNOSIS — Z7189 Other specified counseling: Secondary | ICD-10-CM

## 2020-11-14 DIAGNOSIS — C50811 Malignant neoplasm of overlapping sites of right female breast: Secondary | ICD-10-CM

## 2020-11-14 DIAGNOSIS — C50911 Malignant neoplasm of unspecified site of right female breast: Secondary | ICD-10-CM

## 2020-11-14 DIAGNOSIS — C7801 Secondary malignant neoplasm of right lung: Secondary | ICD-10-CM

## 2020-11-14 DIAGNOSIS — G893 Neoplasm related pain (acute) (chronic): Secondary | ICD-10-CM

## 2020-11-14 DIAGNOSIS — Z170 Estrogen receptor positive status [ER+]: Secondary | ICD-10-CM

## 2020-11-14 DIAGNOSIS — D649 Anemia, unspecified: Secondary | ICD-10-CM

## 2020-11-14 DIAGNOSIS — C7800 Secondary malignant neoplasm of unspecified lung: Secondary | ICD-10-CM

## 2020-11-14 DIAGNOSIS — R0602 Shortness of breath: Secondary | ICD-10-CM

## 2020-11-14 DIAGNOSIS — J90 Pleural effusion, not elsewhere classified: Secondary | ICD-10-CM

## 2020-11-14 LAB — CMP (CANCER CENTER ONLY)
ALT: 14 U/L (ref 0–44)
AST: 20 U/L (ref 15–41)
Albumin: 3.5 g/dL (ref 3.5–5.0)
Alkaline Phosphatase: 89 U/L (ref 38–126)
Anion gap: 5 (ref 5–15)
BUN: 26 mg/dL — ABNORMAL HIGH (ref 8–23)
CO2: 30 mmol/L (ref 22–32)
Calcium: 8.8 mg/dL — ABNORMAL LOW (ref 8.9–10.3)
Chloride: 103 mmol/L (ref 98–111)
Creatinine: 0.91 mg/dL (ref 0.44–1.00)
GFR, Estimated: >60 mL/min (ref >60)
Glucose, Bld: 92 mg/dL (ref 70–99)
Potassium: 4 mmol/L (ref 3.5–5.1)
Sodium: 138 mmol/L (ref 135–145)
Total Bilirubin: 0.2 mg/dL — ABNORMAL LOW (ref 0.3–1.2)
Total Protein: 6.7 g/dL (ref 6.5–8.1)

## 2020-11-14 LAB — CBC WITH DIFFERENTIAL/PLATELET
Abs Immature Granulocytes: 0.02 10*3/uL (ref 0.00–0.07)
Basophils Absolute: 0.1 10*3/uL (ref 0.0–0.1)
Basophils Relative: 2 %
Eosinophils Absolute: 0.1 10*3/uL (ref 0.0–0.5)
Eosinophils Relative: 2 %
HCT: 29.8 % — ABNORMAL LOW (ref 36.0–46.0)
Hemoglobin: 9.6 g/dL — ABNORMAL LOW (ref 12.0–15.0)
Immature Granulocytes: 1 %
Lymphocytes Relative: 21 %
Lymphs Abs: 0.9 10*3/uL (ref 0.7–4.0)
MCH: 38.1 pg — ABNORMAL HIGH (ref 26.0–34.0)
MCHC: 32.2 g/dL (ref 30.0–36.0)
MCV: 118.3 fL — ABNORMAL HIGH (ref 80.0–100.0)
Monocytes Absolute: 0.2 10*3/uL (ref 0.1–1.0)
Monocytes Relative: 5 %
Neutro Abs: 2.8 10*3/uL (ref 1.7–7.7)
Neutrophils Relative %: 69 %
Platelets: 320 10*3/uL (ref 150–400)
RBC: 2.52 MIL/uL — ABNORMAL LOW (ref 3.87–5.11)
RDW: 14.2 % (ref 11.5–15.5)
WBC: 4 10*3/uL (ref 4.0–10.5)
nRBC: 0 % (ref 0.0–0.2)

## 2020-11-14 MED ORDER — CYANOCOBALAMIN 1000 MCG/ML IJ SOLN
1000.0000 ug | Freq: Once | INTRAMUSCULAR | Status: AC
  Administered 2020-11-14: 1000 ug via INTRAMUSCULAR

## 2020-11-14 MED ORDER — DENOSUMAB 120 MG/1.7ML ~~LOC~~ SOLN
120.0000 mg | Freq: Once | SUBCUTANEOUS | Status: AC
  Administered 2020-11-14: 120 mg via SUBCUTANEOUS

## 2020-11-14 MED ORDER — CYANOCOBALAMIN 1000 MCG/ML IJ SOLN
INTRAMUSCULAR | Status: AC
  Filled 2020-11-14: qty 1

## 2020-11-14 MED ORDER — DENOSUMAB 120 MG/1.7ML ~~LOC~~ SOLN
SUBCUTANEOUS | Status: AC
  Filled 2020-11-14: qty 1.7

## 2020-11-14 NOTE — Progress Notes (Addendum)
[place]  [phone] [phone]   ID: Suzanne [name] DOB: 17-May-1961  MR#: [id number]  GYI#:[id number]  Patient Care Team: [name], NP as PCP - General (Nurse Practitioner) Jamol Ginyard, Virgie Dad, MD as Consulting Physician (Oncology) Netta Cedars, MD as Consulting Physician (Orthopedic Surgery) Melissa Montane, RN as Case Manager [facility] as Pharmacist (Pharmacist) OTHER MD:   CHIEF COMPLAINT: Stage IV estrogen receptor positive breast cancer  CURRENT TREATMENT: Anastrozole; Palbociclib; Delton See   INTERVAL HISTORY: Suzanne [name] returns today for follow-up of her metastatic cancer.  She is accompanied by her daughter Suzanne [name] continues on anastrozole.  She tolerates this well, with some unpredictable hot flashes here and there, no significant issues with vaginal dryness or arthralgias/myalgias.  She is also on palbociclib at 75 mg daily 21 days on 7 days off.  She was switched from 100 mg to 75 mg on 07/11/2020 due to cytopenias.  She tolerates this with no side effects that she is aware of  She receives Xgeva every 4 weeks, last given 10/17/2020. She has no issues with receiving this, and denies any dental concerns.   She was started on B12 replacement 07/28/2020.  Lab Results  Component Value Date   CA2729 42.2 (H) 10/17/2020   CA2729 42.7 (H) 09/19/2020   CA2729 52.5 (H) 08/22/2020   CA2729 27.8 07/25/2020   CA2729 21.0 06/23/2020   Her most recent staging studies was a CT of the chest December 2021.  Repeat chest CT was ordered for May but Suzanne [name] tells me they were never called.   REVIEW OF SYSTEMS: Suzanne [name] tells me she continues to feel "better and better" and is now almost "normal".  She does use oxygen 24/7 at home but at a lower 8, currently 1-1/2 L/min.  According to the daughter Suzanne [name] is now able to do housework without having to stop in the middle of it to catch her breath.  Suzanne [name] did have a little  bit of nausea.  She takes Pepcid for this and that takes care of it.  They have been battling with her Candida and they are finally getting places as discussed in the physical exam.  A detailed review of systems today was otherwise stable   COVID 19 VACCINATION STATUS: Status post [place] x2 with booster December 2021   HISTORY OF CURRENT ILLNESS: From the original inpatient consult note:  Suzanne [name] is a 60 year old female from [place], [place] without significant past medical history with exception of obesity, remote history of gallstones, and remote history of mastitis.  The patient presented to the hospital on 10/26/2018 with a 3-week history of worsening shortness of breath and lower extremity edema.  The patient reported that her shortness of breath worsened with exertion.  Her lower extremity edema did not improve with elevation.  She also had an intermittent pr oductive cough with rare sputum production.  The patient was noted to be anemic with a hemoglobin of 4.5 with a clumped platelets noted on admission.  Stool for occult blood was positive.  Patient received 2 units of packed red blood cells.    The patient reported a rash under her breast x1 week which has been foul-smelling.  She also reported that she has some discomfort and thickening/hardness of her right breast for several months.  She states that her right breast is much larger than her left.  She notes some occasional bloody discharge from her breast once in a while.  A  chest x-ray on admission showed diffuse bilateral interstitial opacity of unknown chronicity, findings could be related to interstitial inflammatory process, atypical/viral pneumonia, or possible metastatic disease.  There are also sclerotic and lytic lesions within the clavicles, bilateral ribs, shoulders which was concerning for diffuse few skeletal metastatic disease.    This prompted a CT scan of the chest with contrast which showed bilateral axillary and  mediastinal adenopathy, borderline hilar lymph nodes bilaterally, findings likely flecked metastatic lymphadenopathy, moderate bilateral pleural effusions, extensive interstitial thickening and reticulonodular opacities throughout the lungs, cannot exclude lymphangitic spread of tumor, diffuse skeletal metastases.  The patient's subsequent history is as detailed below.   PAST MEDICAL HISTORY: Past Medical History:  Diagnosis Date   Cancer [facility])    Family history of breast cancer    Family history of colon cancer    Family history of melanoma    Family history of prostate cancer    Gallstones    Glaucoma    Mastitis    reports history of recurrent mastitis   Metastatic breast cancer ([place])    Obesity     PAST SURGICAL HISTORY: Past Surgical History:  Procedure Laterality Date   GLAUCOMA SURGERY  late 1990's   IR THORACENTESIS ASP PLEURAL SPACE W/IMG GUIDE  10/28/2018   IR THORACENTESIS ASP PLEURAL SPACE W/IMG GUIDE  10/29/2018   OPEN REDUCTION INTERNAL FIXATION (ORIF) DISTAL RADIAL FRACTURE Left 01/09/2019   Procedure: OPEN REDUCTION INTERNAL FIXATION (ORIF) HUMERAL FRACTURE;  Surgeon: Netta Cedars, MD;  Location: [facility];  Service: Orthopedics;  Laterality: Left;    FAMILY HISTORY: Family History  Problem Relation Age of Onset   Breast cancer Sister        in her 43's-70s   Heart disease Brother        CABG   Heart disease Brother        CABG   Skin cancer Brother        melanoma   Colon cancer Cousin    Heart attack Mother    Heart attack Father    Prostate cancer Brother   Patient's father passed away at age 50, and her mother at 75, both from heart attacks. The patient has 8 siblings, 6 brothers and 1 sister.  Her sister was diagnosed with breast cancer. One brother has prostate cancer, another brother had a melanoma removed.    GYNECOLOGIC HISTORY:  Menarche: 60 years old Age at first live birth: 60 years old [place] P 1 LMP age 43 Contraceptive: used for 2-3 years with  no problems HRT never used  Hysterectomy? no   SOCIAL HISTORY:  Rashia worked for an Engineer, manufacturing systems. She is now on disability. She is widowed. She lives at home with her daughter.  Suzanne [name], 37, who is a Psychologist, occupational at [employer] center.  The patient has no grandchildren. She is not a Ambulance person.   ADVANCED DIRECTIVES: not on file; she has the paperwork already. She intends to name her daughter as her HCPOA.   HEALTH MAINTENANCE: Social History   Tobacco Use   Smoking status: Never   Smokeless tobacco: Never   Tobacco comments:    second hand smoke exposure  Vaping Use   Vaping Use: Never used  Substance Use Topics   Alcohol use: No   Drug use: No    Colonoscopy:   PAP:   Bone density:  Mammography:   No Known Allergies  Current Outpatient Medications  Medication Sig Dispense Refill   anastrozole (ARIMIDEX) 1  MG tablet Take 1 tablet (1 mg total) by mouth daily. 90 tablet 4   antiseptic oral rinse (BIOTENE) LIQD 15 mLs by Mouth Rinse route as needed for dry mouth. (Patient not taking: No sig reported)     Blood Pressure Monitor MISC Medium sized cuff. Use to check blood pressure twice per day and more often if not feeling well. I10.0 1 each 0   calcium-vitamin D (OSCAL WITH D) 500-200 MG-UNIT tablet Take 1 tablet by mouth 3 (three) times daily. 90 tablet 1   cetirizine (ZYRTEC) 10 MG tablet Take 1 tablet (10 mg total) by mouth daily. 90 tablet 3   cyanocobalamin ([employer] VITAMIN B12) 2000 MCG tablet Take 1 tablet (2,000 mcg total) by mouth daily. 90 tablet 4   famotidine (PEPCID) 20 MG tablet Take 1 tablet (20 mg total) by mouth 2 (two) times daily as needed for heartburn or indigestion. 30 tablet 3   ferrous sulfate 325 (65 FE) MG EC tablet TAKE ONE TABLET BY MOUTH THREE TIMES DAILY with meals 90 tablet 3   folic acid (FOLVITE) 1 MG tablet Take 1 tablet (1 mg total) by mouth daily. 90 tablet 4   furosemide (LASIX) 20 MG tablet Take 1 tablet (20 mg total) by  mouth daily. 90 tablet 4   ketoconazole (NIZORAL) 2 % cream APPLY TO THE AFFECTED AREA(S) DAILY AS NEEDED 30 g 6   methocarbamol (ROBAXIN) 500 MG tablet Take 500 mg by mouth every 6 (six) hours as needed. (Patient not taking: Reported on 08/17/2020)     nystatin (MYCOSTATIN/NYSTOP) powder Apply 1 application topically as needed. 15 g 1   palbociclib (IBRANCE) 75 MG tablet TAKE 1 TABLET (75 MG TOTAL) BY MOUTH DAILY. TAKE FOR 21 DAYS ON, 7 DAYS OFF, REPEAT EVERY 28 DAYS 21 tablet 6   polyethylene glycol (MIRALAX / GLYCOLAX) 17 g packet Take 17 g by mouth as needed. 14 each 6   sertraline (ZOLOFT) 50 MG tablet Take 1.5 tablets (75 mg total) by mouth at bedtime. 90 day supply is 135 tablets 135 tablet 0   ZOFRAN 8 MG tablet Take 1 tablet (8 mg total) by mouth every 8 (eight) hours. 20 tablet 1   No current facility-administered medications for this visit.    OBJECTIVE: White woman examined in a wheelchair  Vitals:   11/14/20 1335  BP: 138/77  Pulse: 83  Resp: 18  Temp: 97.7 F (36.5 C)  SpO2: 100%     Body mass index is 40.05 kg/m.   Wt Readings from Last 3 Encounters:  11/14/20 198 lb 4.8 oz (89.9 kg)  09/19/20 199 lb 3.2 oz (90.4 kg)  07/25/20 201 lb 12.8 oz (91.5 kg)  ECOG FS: 2  Sclerae unicteric, EOMs intact Wearing a mask No cervical or supraclavicular adenopathy Lungs no rales or rhonchi Heart regular rate and rhythm Abd soft, nontender, positive bowel sounds MSK no focal spinal tenderness, no upper extremity lymphedema Neuro: nonfocal, well oriented, appropriate affect Breasts: The right breast is unremarkable.  The right nipple which was inverted is now much less inverted and almost normal.  There is no erythema.  The left breast and both axillae are benign. Skin: The candidal rash in the inframammary fold and other intertriginous areas is almost completely resolved.  LAB RESULTS:  CMP     Component Value Date/Time   NA 142 10/17/2020 1323   K 3.5 10/17/2020 1323    CL 106 10/17/2020 1323   CO2 30 10/17/2020 1323  GLUCOSE 106 (H) 10/17/2020 1323   BUN 16 10/17/2020 1323   CREATININE 0.97 10/17/2020 1323   CREATININE 0.93 04/07/2020 1216   CALCIUM 9.3 10/17/2020 1323   PROT 6.5 10/17/2020 1323   ALBUMIN 3.3 (L) 10/17/2020 1323   AST 15 10/17/2020 1323   AST 16 04/07/2020 1216   ALT 7 10/17/2020 1323   ALT 6 04/07/2020 1216   ALKPHOS 95 10/17/2020 1323   BILITOT 0.3 10/17/2020 1323   BILITOT 0.5 04/07/2020 1216   GFRNONAA >60 10/17/2020 1323   GFRNONAA >60 04/07/2020 1216   GFRAA >60 02/24/2020 1454   GFRAA >60 07/24/2019 1453    No results found for: TOTALPROTELP, ALBUMINELP, A1GS, A2GS, BETS, BETA2SER, GAMS, MSPIKE, SPEI  No results found for: KPAFRELGTCHN, LAMBDASER, KAPLAMBRATIO  Lab Results  Component Value Date   WBC 4.0 11/14/2020   NEUTROABS 2.8 11/14/2020   HGB 9.6 (L) 11/14/2020   HCT 29.8 (L) 11/14/2020   MCV 118.3 (H) 11/14/2020   PLT 320 11/14/2020   No results found for: LABCA2  No components found for: XLKGMW102  No results for input(s): INR in the last 168 hours.  No results found for: LABCA2  No results found for: VOZ366  No results found for: YQI347  No results found for: QQV956  Lab Results  Component Value Date   CA2729 42.2 (H) 10/17/2020    No components found for: HGQUANT  No results found for: CEA1 / No results found for: CEA1   No results found for: AFPTUMOR  No results found for: CHROMOGRNA  No results found for: HGBA, HGBA2QUANT, HGBFQUANT, HGBSQUAN (Hemoglobinopathy evaluation)   Lab Results  Component Value Date   LDH 215 (H) 07/07/2020    Lab Results  Component Value Date   IRON 40 (L) 07/07/2020   TIBC 448 (H) 07/07/2020   IRONPCTSAT 9 (L) 07/07/2020   (Iron and TIBC)  Lab Results  Component Value Date   FERRITIN 21 07/07/2020    Urinalysis    Component Value Date/Time   COLORURINE YELLOW 04/07/2020 [place] 04/07/2020 1216   LABSPEC 1.026  04/07/2020 1216   PHURINE 5.0 04/07/2020 1216   GLUCOSEU NEGATIVE 04/07/2020 1216   HGBUR NEGATIVE 04/07/2020 1216   BILIRUBINUR NEGATIVE 04/07/2020 [place] 04/07/2020 1216   PROTEINUR NEGATIVE 04/07/2020 1216   NITRITE NEGATIVE 04/07/2020 1216   LEUKOCYTESUR SMALL (A) 04/07/2020 1216    STUDIES:  No results found.   ELIGIBLE FOR AVAILABLE RESEARCH PROTOCOL: no   ASSESSMENT: 60 y.o. [place], [place] woman presenting 10/26/2018 with right-sided inflammatory breast cancer, stage IV, involvnh lungs, lymph nodes and bones, as follows:  (a) chest CT scan 10/26/2018 shows bilateral pleural effusions, possible lymphangitic spread of tumor, diffuse bony metastatic disease, and significant axillary mediastinal and hilar adenopathy  (b) bone scan 10/27/2018 is a "near [place]" consistent with widespread bony metastatic disease  (c) head CT with and without contrast 10/30/2018 shows no intracranial metastatic disease, multiple calvarial lesions  (d) CA-27-29 on 10/27/2018 was 1810.4  (1) pleural fluid from right thoracentesis 10/28/2018 confirms malignant cells consistent with a breast primary, strongly estrogen and progesterone receptor positive, HER-2 not amplified, with an MIB-1 of 2%  (2) anastrozole started 10/29/2018  (a) palbociclib started 11/13/2018 at 100 mg dose  (b) denosumab/xgeva started 11/13/2018   (C) palbociclib dose started  (3) associated problems:  (a) hypoxia secondary to effusions  (b) pain from bone lesions and left humeral and vertebral compression fractures  (c)  right upper extremity lymphedema  (d) poor venous access  (4) genetics testing on 12/25/2018 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 STK11, TP53, TSC1, TSC2, and VHL.  The following genes were evaluated for sequence changes only: SDHA and HOXB13 c.251G>A variant only. The [facility] Melanoma Panel analyzed the following 9 genes: BAP1 BRCA2 CDK4 CDKN2A MITF POT1 PTEN RB1 Tp53.   (5) palliative radiation to the left humerus 07/20/2019 - 07/31/2019 Site Technique Total Dose (Gy) Dose per Fx (Gy) Completed Fx Beam Energies  Humerus, Left: Ext_Lt_humerus Complex 30/30 3 10/10 6X   (6) anemia requiring transfusion:   (a) B-12 level 134 FEB 2022: B-12 injectons started    PLAN: Suzanne [name] is now just over 2 years out from definitive diagnosis of metastatic breast cancer.  She has no symptoms currently related to her tumor.  Her disease is clinically well controlled.  She is also tolerating treatment remarkably well.  We have had to drop the dose of palbociclib but we still have 1 dose level left before we need to switch to a different treatment in the absence of any evidence of disease progression  I have encouraged her to be as active as possible.  We will continue to work on the candidal infection which has been a real issue for her and is almost now completely resolved.  She was supposed to have had a CT of the chest performed before today's visit.  I have reentered that order.  If they do not get called by the end of July they will let us know.  Otherwise we are continuing the Xgeva every 4 weeks and visits every 8 weeks  Total encounter time 25 minutes.Suzanne [name] C. Brayen Bunn, MD 11/14/20 2:06 PM Medical Oncology and Hematology [facility] [place], [place] [address] Tel. [phone]    Fax. [phone]   I, Wilburn Mylar,  am acting as scribe for Dr. Virgie Dad. Dai Apel.  I, Lurline Del MD, have reviewed the above documentation for accuracy and completeness, and I agree with the above.   *Total Encounter Time as defined by the Centers for Medicare and Medicaid Services includes, in addition to the face-to-face time of a patient visit (documented in the note above) non-face-to-face time: obtaining and reviewing outside history, ordering and reviewing medications, tests or procedures, care coordination (communications with other health care professionals or caregivers) and documentation in the medical record.

## 2020-11-14 NOTE — Telephone Encounter (Signed)
error 

## 2020-11-14 NOTE — Patient Instructions (Addendum)
Vitamin B12 Injection What is this medication? Vitamin B12 (VAHY tuh min B12) prevents and treats low vitamin B12 levels in your body. It is used in people who do not get enough vitamin B12 from their diet or when their digestive tract does not absorb enough. Vitamin B12 plays an important role in maintaining the health of your nervous system and red bloodcells. This medicine may be used for other purposes; ask your health care provider orpharmacist if you have questions. COMMON BRAND NAME(S): B-12 Compliance Kit, B-12 Injection Kit, Cyomin, LA-12,Nutri-Twelve, Physicians EZ Use B-12, Primabalt What should I tell my care team before I take this medication? They need to know if you have any of these conditions: Kidney disease Leber's disease Megaloblastic anemia An unusual or allergic reaction to cyanocobalamin, cobalt, other medications, foods, dyes, or preservatives Pregnant or trying to get pregnant Breast-feeding How should I use this medication? This medication is injected into a muscle or deeply under the skin. It is usually given in a clinic or care team's office. However, your care team Esperanza Sheets you how to inject yourself. Follow all instructions. Talk to your care team about the use of this medication in children. Specialcare may be needed. Overdosage: If you think you have taken too much of this medicine contact apoison control center or emergency room at once. NOTE: This medicine is only for you. Do not share this medicine with others. What if I miss a dose? If you are given your dose at a clinic or care team's office, call to reschedule your appointment. If you give your own injections, and you miss a dose, take it as soon as you can. If it is almost time for your next dose, takeonly that dose. Do not take double or extra doses. What may interact with this medication? Colchicine Heavy alcohol intake This list may not describe all possible interactions. Give your health care provider  a list of all the medicines, herbs, non-prescription drugs, or dietary supplements you use. Also tell them if you smoke, drink alcohol, or use illegaldrugs. Some items may interact with your medicine. What should I watch for while using this medication? Visit your care team regularly. You may need blood work done while you aretaking this medication. You may need to follow a special diet. Talk to your care team. Limit youralcohol intake and avoid smoking to get the best benefit. What side effects may I notice from receiving this medication? Side effects that you should report to your care team as soon as possible: Allergic reactions-skin rash, itching, hives, swelling of the face, lips, tongue, or throat Swelling of the ankles, hands, or feet Trouble breathing Side effects that usually do not require medical attention (report to your careteam if they continue or are bothersome): Diarrhea This list may not describe all possible side effects. Call your doctor for medical advice about side effects. You may report side effects to FDA at1-800-FDA-1088. Where should I keep my medication? Keep out of the reach of children. Store at room temperature between 15 and 30 degrees C (59 and 85 degrees F).Protect from light. Throw away any unused medication after the expiration date. NOTE: This sheet is a summary. It may not cover all possible information. If you have questions about this medicine, talk to your doctor, pharmacist, orhealth care provider.  2022 Elsevier/Gold Standard (2020-07-04 11:47:06)  Denosumab injection What is this medication? DENOSUMAB (den oh sue mab) slows bone breakdown. Prolia is used to treat osteoporosis in women after menopause and in  men, and in people who are taking corticosteroids for 6 months or more. Delton See is used to treat a high calcium level due to cancer and to prevent bone fractures and other bone problems caused by multiple myeloma or cancer bone metastases. Delton See is  also used totreat giant cell tumor of the bone. This medicine may be used for other purposes; ask your health care provider orpharmacist if you have questions. COMMON BRAND NAME(S): Prolia, XGEVA What should I tell my care team before I take this medication? They need to know if you have any of these conditions: dental disease having surgery or tooth extraction infection kidney disease low levels of calcium or Vitamin D in the blood malnutrition on hemodialysis skin conditions or sensitivity thyroid or parathyroid disease an unusual reaction to denosumab, other medicines, foods, dyes, or preservatives pregnant or trying to get pregnant breast-feeding How should I use this medication? This medicine is for injection under the skin. It is given by a health careprofessional in a hospital or clinic setting. A special MedGuide will be given to you before each treatment. Be sure to readthis information carefully each time. For Prolia, talk to your pediatrician regarding the use of this medicine in children. Special care may be needed. For Delton See, talk to your pediatrician regarding the use of this medicine in children. While this drug may be prescribed for children as young as 13 years for selected conditions,precautions do apply. Overdosage: If you think you have taken too much of this medicine contact apoison control center or emergency room at once. NOTE: This medicine is only for you. Do not share this medicine with others. What if I miss a dose? It is important not to miss your dose. Call your doctor or health careprofessional if you are unable to keep an appointment. What may interact with this medication? Do not take this medicine with any of the following medications: other medicines containing denosumab This medicine may also interact with the following medications: medicines that lower your chance of fighting infection steroid medicines like prednisone or cortisone This list may not  describe all possible interactions. Give your health care provider a list of all the medicines, herbs, non-prescription drugs, or dietary supplements you use. Also tell them if you smoke, drink alcohol, or use illegaldrugs. Some items may interact with your medicine. What should I watch for while using this medication? Visit your doctor or health care professional for regular checks on your progress. Your doctor or health care professional may order blood tests andother tests to see how you are doing. Call your doctor or health care professional for advice if you get a fever, chills or sore throat, or other symptoms of a cold or flu. Do not treat yourself. This drug may decrease your body's ability to fight infection. Try toavoid being around people who are sick. You should make sure you get enough calcium and vitamin D while you are taking this medicine, unless your doctor tells you not to. Discuss the foods you eatand the vitamins you take with your health care professional. See your dentist regularly. Brush and floss your teeth as directed. Before youhave any dental work done, tell your dentist you are receiving this medicine. Do not become pregnant while taking this medicine or for 5 months after stopping it. Talk with your doctor or health care professional about your birth control options while taking this medicine. Women should inform their doctor if they wish to become pregnant or think they might be pregnant. There  is a potential for serious side effects to an unborn child. Talk to your health careprofessional or pharmacist for more information. What side effects may I notice from receiving this medication? Side effects that you should report to your doctor or health care professionalas soon as possible: allergic reactions like skin rash, itching or hives, swelling of the face, lips, or tongue bone pain breathing problems dizziness jaw pain, especially after dental work redness, blistering,  peeling of the skin signs and symptoms of infection like fever or chills; cough; sore throat; pain or trouble passing urine signs of low calcium like fast heartbeat, muscle cramps or muscle pain; pain, tingling, numbness in the hands or feet; seizures unusual bleeding or bruising unusually weak or tired Side effects that usually do not require medical attention (report to yourdoctor or health care professional if they continue or are bothersome): constipation diarrhea headache joint pain loss of appetite muscle pain runny nose tiredness upset stomach This list may not describe all possible side effects. Call your doctor for medical advice about side effects. You may report side effects to FDA at1-800-FDA-1088. Where should I keep my medication? This medicine is only given in a clinic, doctor's office, or other health caresetting and will not be stored at home. NOTE: This sheet is a summary. It may not cover all possible information. If you have questions about this medicine, talk to your doctor, pharmacist, orhealth care provider.  2022 Elsevier/Gold Standard (2017-09-20 16:10:44)

## 2020-11-15 ENCOUNTER — Other Ambulatory Visit: Payer: Self-pay | Admitting: Oncology

## 2020-11-15 DIAGNOSIS — C50811 Malignant neoplasm of overlapping sites of right female breast: Secondary | ICD-10-CM | POA: Diagnosis not present

## 2020-11-15 LAB — CANCER ANTIGEN 27.29: CA 27.29: 46.5 U/mL — ABNORMAL HIGH (ref 0.0–38.6)

## 2020-11-16 DIAGNOSIS — C50811 Malignant neoplasm of overlapping sites of right female breast: Secondary | ICD-10-CM | POA: Diagnosis not present

## 2020-11-17 DIAGNOSIS — C50811 Malignant neoplasm of overlapping sites of right female breast: Secondary | ICD-10-CM | POA: Diagnosis not present

## 2020-11-18 DIAGNOSIS — C50811 Malignant neoplasm of overlapping sites of right female breast: Secondary | ICD-10-CM | POA: Diagnosis not present

## 2020-11-21 DIAGNOSIS — C50811 Malignant neoplasm of overlapping sites of right female breast: Secondary | ICD-10-CM | POA: Diagnosis not present

## 2020-11-22 ENCOUNTER — Other Ambulatory Visit (HOSPITAL_COMMUNITY): Payer: Self-pay

## 2020-11-22 DIAGNOSIS — C50811 Malignant neoplasm of overlapping sites of right female breast: Secondary | ICD-10-CM | POA: Diagnosis not present

## 2020-11-23 DIAGNOSIS — C50811 Malignant neoplasm of overlapping sites of right female breast: Secondary | ICD-10-CM | POA: Diagnosis not present

## 2020-11-24 DIAGNOSIS — C50811 Malignant neoplasm of overlapping sites of right female breast: Secondary | ICD-10-CM | POA: Diagnosis not present

## 2020-11-25 DIAGNOSIS — C50811 Malignant neoplasm of overlapping sites of right female breast: Secondary | ICD-10-CM | POA: Diagnosis not present

## 2020-11-25 DIAGNOSIS — J9621 Acute and chronic respiratory failure with hypoxia: Secondary | ICD-10-CM | POA: Diagnosis not present

## 2020-11-25 DIAGNOSIS — R0602 Shortness of breath: Secondary | ICD-10-CM | POA: Diagnosis not present

## 2020-11-25 DIAGNOSIS — C7801 Secondary malignant neoplasm of right lung: Secondary | ICD-10-CM | POA: Diagnosis not present

## 2020-11-25 DIAGNOSIS — S22000A Wedge compression fracture of unspecified thoracic vertebra, initial encounter for closed fracture: Secondary | ICD-10-CM | POA: Diagnosis not present

## 2020-11-29 DIAGNOSIS — C50811 Malignant neoplasm of overlapping sites of right female breast: Secondary | ICD-10-CM | POA: Diagnosis not present

## 2020-11-30 ENCOUNTER — Other Ambulatory Visit (HOSPITAL_COMMUNITY): Payer: Self-pay

## 2020-11-30 DIAGNOSIS — C50811 Malignant neoplasm of overlapping sites of right female breast: Secondary | ICD-10-CM | POA: Diagnosis not present

## 2020-11-30 MED FILL — Palbociclib Tab 75 MG: ORAL | 21 days supply | Qty: 21 | Fill #3 | Status: AC

## 2020-12-01 DIAGNOSIS — C50811 Malignant neoplasm of overlapping sites of right female breast: Secondary | ICD-10-CM | POA: Diagnosis not present

## 2020-12-02 DIAGNOSIS — C50811 Malignant neoplasm of overlapping sites of right female breast: Secondary | ICD-10-CM | POA: Diagnosis not present

## 2020-12-05 DIAGNOSIS — C50811 Malignant neoplasm of overlapping sites of right female breast: Secondary | ICD-10-CM | POA: Diagnosis not present

## 2020-12-06 ENCOUNTER — Ambulatory Visit: Payer: Medicaid Other | Attending: Nurse Practitioner | Admitting: Nurse Practitioner

## 2020-12-06 ENCOUNTER — Other Ambulatory Visit: Payer: Self-pay

## 2020-12-06 ENCOUNTER — Encounter: Payer: Self-pay | Admitting: Nurse Practitioner

## 2020-12-06 VITALS — BP 125/83 | HR 93

## 2020-12-06 DIAGNOSIS — I10 Essential (primary) hypertension: Secondary | ICD-10-CM | POA: Diagnosis not present

## 2020-12-06 DIAGNOSIS — Z170 Estrogen receptor positive status [ER+]: Secondary | ICD-10-CM | POA: Insufficient documentation

## 2020-12-06 DIAGNOSIS — Z803 Family history of malignant neoplasm of breast: Secondary | ICD-10-CM | POA: Insufficient documentation

## 2020-12-06 DIAGNOSIS — E669 Obesity, unspecified: Secondary | ICD-10-CM | POA: Insufficient documentation

## 2020-12-06 DIAGNOSIS — C50919 Malignant neoplasm of unspecified site of unspecified female breast: Secondary | ICD-10-CM | POA: Diagnosis not present

## 2020-12-06 DIAGNOSIS — Z79899 Other long term (current) drug therapy: Secondary | ICD-10-CM | POA: Diagnosis not present

## 2020-12-06 DIAGNOSIS — Z8249 Family history of ischemic heart disease and other diseases of the circulatory system: Secondary | ICD-10-CM | POA: Diagnosis not present

## 2020-12-06 DIAGNOSIS — S22000A Wedge compression fracture of unspecified thoracic vertebra, initial encounter for closed fracture: Secondary | ICD-10-CM | POA: Diagnosis not present

## 2020-12-06 NOTE — Progress Notes (Signed)
Assessment & Plan:  Diagnoses and all orders for this visit:  Primary hypertension Continue all antihypertensives as prescribed.  Remember to bring in your blood pressure log with you for your follow up appointment.  DASH/Mediterranean Diets are healthier choices for HTN.     Patient has been counseled on age-appropriate routine health concerns for screening and prevention. These are reviewed and up-to-date. Referrals have been placed accordingly. Immunizations are up-to-date or declined.    Subjective:  No chief complaint on file.  HPI Suzanne [name] 60 y.o. female presents to office today for follow up to HTN She has a past medical history of Gallstones, Glaucoma, Mastitis, Metastatic breast cancer,  candida, and Obesity.   Currently being followed by Oncology for Stage IV estrogen receptor positive breast cancer. Current treatments: anastrozole, palbociclib and xvega.   She is in a wheelchair today and accompanied by her daughter. She is on 1.5L of O2.   Essential Hypertension Well controlled with lasix 20mg  daily. Denies chest pain, shortness of breath, palpitations, lightheadedness, dizziness, headaches or BLE edema.   BP Readings from Last 3 Encounters:  12/06/20 125/83  11/14/20 138/77  10/17/20 98/81     Review of Systems  Constitutional:  Negative for fever, malaise/fatigue and weight loss.  HENT: Negative.  Negative for nosebleeds.   Eyes: Negative.  Negative for blurred vision, double vision and photophobia.  Respiratory: Negative.  Negative for cough and shortness of breath.   Cardiovascular: Negative.  Negative for chest pain, palpitations and leg swelling.  Gastrointestinal: Negative.  Negative for heartburn, nausea and vomiting.  Musculoskeletal: Negative.  Negative for myalgias.  Neurological: Negative.  Negative for dizziness, focal weakness, seizures and headaches.  Psychiatric/Behavioral: Negative.  Negative for suicidal ideas.    Past Medical History:   Diagnosis Date   Cancer [facility])    Family history of breast cancer    Family history of colon cancer    Family history of melanoma    Family history of prostate cancer    Gallstones    Glaucoma    Mastitis    reports history of recurrent mastitis   Metastatic breast cancer ([place])    Obesity     Past Surgical History:  Procedure Laterality Date   GLAUCOMA SURGERY  late 1990's   IR THORACENTESIS ASP PLEURAL SPACE W/IMG GUIDE  10/28/2018   IR THORACENTESIS ASP PLEURAL SPACE W/IMG GUIDE  10/29/2018   OPEN REDUCTION INTERNAL FIXATION (ORIF) DISTAL RADIAL FRACTURE Left 01/09/2019   Procedure: OPEN REDUCTION INTERNAL FIXATION (ORIF) HUMERAL FRACTURE;  Surgeon: Netta Cedars, MD;  Location: [facility];  Service: Orthopedics;  Laterality: Left;    Family History  Problem Relation Age of Onset   Breast cancer Sister        in her 36's-70s   Heart disease Brother        CABG   Heart disease Brother        CABG   Skin cancer Brother        melanoma   Colon cancer Cousin    Heart attack Mother    Heart attack Father    Prostate cancer Brother     Social History Reviewed with no changes to be made today.   Outpatient Medications Prior to Visit  Medication Sig Dispense Refill   anastrozole (ARIMIDEX) 1 MG tablet Take 1 tablet (1 mg total) by mouth daily. 90 tablet 4   Blood Pressure Monitor MISC Medium sized cuff. Use to check blood pressure twice per day  and more often if not feeling well. I10.0 1 each 0   calcium-vitamin D (OSCAL WITH D) 500-200 MG-UNIT tablet Take 1 tablet by mouth 3 (three) times daily. 90 tablet 1   cyanocobalamin ([employer] VITAMIN B12) 2000 MCG tablet Take 1 tablet (2,000 mcg total) by mouth daily. 90 tablet 4   famotidine (PEPCID) 20 MG tablet Take 1 tablet (20 mg total) by mouth 2 (two) times daily as needed for heartburn or indigestion. 30 tablet 3   ferrous sulfate 325 (65 FE) MG EC tablet TAKE ONE TABLET BY MOUTH THREE TIMES DAILY with meals 90 tablet 3   folic acid  (FOLVITE) 1 MG tablet Take 1 tablet (1 mg total) by mouth daily. 90 tablet 4   furosemide (LASIX) 20 MG tablet Take 1 tablet (20 mg total) by mouth daily. 90 tablet 4   ketoconazole (NIZORAL) 2 % cream APPLY TO THE AFFECTED AREA(S) DAILY AS NEEDED 30 g 6   nystatin (MYCOSTATIN/NYSTOP) powder Apply 1 application topically as needed. 15 g 1   palbociclib (IBRANCE) 75 MG tablet TAKE 1 TABLET (75 MG TOTAL) BY MOUTH DAILY. TAKE FOR 21 DAYS ON, 7 DAYS OFF, REPEAT EVERY 28 DAYS 21 tablet 6   polyethylene glycol (MIRALAX / GLYCOLAX) 17 g packet Take 17 g by mouth as needed. 14 each 6   ZOFRAN 8 MG tablet Take 1 tablet (8 mg total) by mouth every 8 (eight) hours. 20 tablet 1   antiseptic oral rinse (BIOTENE) LIQD 15 mLs by Mouth Rinse route as needed for dry mouth. (Patient not taking: No sig reported)     cetirizine (ZYRTEC) 10 MG tablet Take 1 tablet (10 mg total) by mouth daily. 90 tablet 3   methocarbamol (ROBAXIN) 500 MG tablet Take 500 mg by mouth every 6 (six) hours as needed. (Patient not taking: No sig reported)     sertraline (ZOLOFT) 50 MG tablet Take 1.5 tablets (75 mg total) by mouth at bedtime. 90 day supply is 135 tablets 135 tablet 0   No facility-administered medications prior to visit.    No Known Allergies     Objective:    BP 125/83   Pulse 93   LMP 05/28/2013 (Within Months)   SpO2 92%  Wt Readings from Last 3 Encounters:  11/14/20 198 lb 4.8 oz (89.9 kg)  09/19/20 199 lb 3.2 oz (90.4 kg)  07/25/20 201 lb 12.8 oz (91.5 kg)    Physical Exam Vitals and nursing note reviewed.  Constitutional:      Appearance: She is well-developed.  HENT:     Head: Normocephalic and atraumatic.  Cardiovascular:     Rate and Rhythm: Normal rate and regular rhythm.     Heart sounds: Normal heart sounds. No murmur heard.   No friction rub. No gallop.  Pulmonary:     Effort: Pulmonary effort is normal. No tachypnea or respiratory distress.     Breath sounds: Normal breath sounds. No  decreased breath sounds, wheezing, rhonchi or rales.  Chest:     Chest wall: No tenderness.  Abdominal:     General: Bowel sounds are normal.     Palpations: Abdomen is soft.  Musculoskeletal:        General: Normal range of motion.     Cervical back: Normal range of motion.  Skin:    General: Skin is warm and dry.  Neurological:     Mental Status: She is alert and oriented to person, place, and time.  Psychiatric:  Behavior: Behavior normal. Behavior is cooperative.        Thought Content: Thought content normal.        Judgment: Judgment normal.         Patient has been counseled extensively about nutrition and exercise as well as the importance of adherence with medications and regular follow-up. The patient was given clear instructions to go to ER or return to medical center if symptoms don't improve, worsen or new problems develop. The patient verbalized understanding.   Follow-up: Return in about 6 months (around 06/08/2021).   [name], FNP-BC [facility], [place]   12/06/2020, 7:16 PM

## 2020-12-07 DIAGNOSIS — C50811 Malignant neoplasm of overlapping sites of right female breast: Secondary | ICD-10-CM | POA: Diagnosis not present

## 2020-12-08 DIAGNOSIS — C50811 Malignant neoplasm of overlapping sites of right female breast: Secondary | ICD-10-CM | POA: Diagnosis not present

## 2020-12-09 ENCOUNTER — Other Ambulatory Visit: Payer: Self-pay | Admitting: *Deleted

## 2020-12-09 DIAGNOSIS — C50811 Malignant neoplasm of overlapping sites of right female breast: Secondary | ICD-10-CM | POA: Diagnosis not present

## 2020-12-11 ENCOUNTER — Encounter: Payer: Self-pay | Admitting: Nurse Practitioner

## 2020-12-12 ENCOUNTER — Other Ambulatory Visit: Payer: Self-pay

## 2020-12-12 ENCOUNTER — Inpatient Hospital Stay: Payer: Medicaid Other

## 2020-12-12 ENCOUNTER — Inpatient Hospital Stay: Payer: Medicaid Other | Attending: Adult Health

## 2020-12-12 VITALS — BP 141/95 | HR 94 | Temp 97.9°F | Resp 16

## 2020-12-12 DIAGNOSIS — C7801 Secondary malignant neoplasm of right lung: Secondary | ICD-10-CM

## 2020-12-12 DIAGNOSIS — J90 Pleural effusion, not elsewhere classified: Secondary | ICD-10-CM

## 2020-12-12 DIAGNOSIS — C50911 Malignant neoplasm of unspecified site of right female breast: Secondary | ICD-10-CM | POA: Insufficient documentation

## 2020-12-12 DIAGNOSIS — C7951 Secondary malignant neoplasm of bone: Secondary | ICD-10-CM

## 2020-12-12 DIAGNOSIS — E538 Deficiency of other specified B group vitamins: Secondary | ICD-10-CM | POA: Diagnosis not present

## 2020-12-12 DIAGNOSIS — Z170 Estrogen receptor positive status [ER+]: Secondary | ICD-10-CM

## 2020-12-12 DIAGNOSIS — G893 Neoplasm related pain (acute) (chronic): Secondary | ICD-10-CM

## 2020-12-12 DIAGNOSIS — R0602 Shortness of breath: Secondary | ICD-10-CM

## 2020-12-12 DIAGNOSIS — D649 Anemia, unspecified: Secondary | ICD-10-CM

## 2020-12-12 DIAGNOSIS — C50811 Malignant neoplasm of overlapping sites of right female breast: Secondary | ICD-10-CM

## 2020-12-12 LAB — COMPREHENSIVE METABOLIC PANEL
ALT: 9 U/L (ref 0–44)
AST: 17 U/L (ref 15–41)
Albumin: 3.6 g/dL (ref 3.5–5.0)
Alkaline Phosphatase: 110 U/L (ref 38–126)
Anion gap: 12 (ref 5–15)
BUN: 19 mg/dL (ref 8–23)
CO2: 30 mmol/L (ref 22–32)
Calcium: 10 mg/dL (ref 8.9–10.3)
Chloride: 102 mmol/L (ref 98–111)
Creatinine, Ser: 1.07 mg/dL — ABNORMAL HIGH (ref 0.44–1.00)
GFR, Estimated: 59 mL/min — ABNORMAL LOW (ref >60)
Glucose, Bld: 106 mg/dL — ABNORMAL HIGH (ref 70–99)
Potassium: 3.7 mmol/L (ref 3.5–5.1)
Sodium: 144 mmol/L (ref 135–145)
Total Bilirubin: 0.4 mg/dL (ref 0.3–1.2)
Total Protein: 7.1 g/dL (ref 6.5–8.1)

## 2020-12-12 LAB — CBC WITH DIFFERENTIAL/PLATELET
Abs Immature Granulocytes: 0.01 10*3/uL (ref 0.00–0.07)
Basophils Absolute: 0.1 10*3/uL (ref 0.0–0.1)
Basophils Relative: 3 %
Eosinophils Absolute: 0.1 10*3/uL (ref 0.0–0.5)
Eosinophils Relative: 2 %
HCT: 32 % — ABNORMAL LOW (ref 36.0–46.0)
Hemoglobin: 10.4 g/dL — ABNORMAL LOW (ref 12.0–15.0)
Immature Granulocytes: 0 %
Lymphocytes Relative: 22 %
Lymphs Abs: 0.7 10*3/uL (ref 0.7–4.0)
MCH: 38 pg — ABNORMAL HIGH (ref 26.0–34.0)
MCHC: 32.5 g/dL (ref 30.0–36.0)
MCV: 116.8 fL — ABNORMAL HIGH (ref 80.0–100.0)
Monocytes Absolute: 0.2 10*3/uL (ref 0.1–1.0)
Monocytes Relative: 6 %
Neutro Abs: 2.1 10*3/uL (ref 1.7–7.7)
Neutrophils Relative %: 67 %
Platelets: 351 10*3/uL (ref 150–400)
RBC: 2.74 MIL/uL — ABNORMAL LOW (ref 3.87–5.11)
RDW: 14.5 % (ref 11.5–15.5)
WBC: 3.1 10*3/uL — ABNORMAL LOW (ref 4.0–10.5)
nRBC: 0 % (ref 0.0–0.2)

## 2020-12-12 MED ORDER — CYANOCOBALAMIN 1000 MCG/ML IJ SOLN
INTRAMUSCULAR | Status: AC
  Filled 2020-12-12: qty 1

## 2020-12-12 MED ORDER — CYANOCOBALAMIN 1000 MCG/ML IJ SOLN
1000.0000 ug | Freq: Once | INTRAMUSCULAR | Status: AC
  Administered 2020-12-12: 1000 ug via INTRAMUSCULAR

## 2020-12-12 MED ORDER — DENOSUMAB 120 MG/1.7ML ~~LOC~~ SOLN
SUBCUTANEOUS | Status: AC
  Filled 2020-12-12: qty 1.7

## 2020-12-12 MED ORDER — DENOSUMAB 120 MG/1.7ML ~~LOC~~ SOLN
120.0000 mg | Freq: Once | SUBCUTANEOUS | Status: AC
  Administered 2020-12-12: 120 mg via SUBCUTANEOUS

## 2020-12-12 NOTE — Patient Outreach (Signed)
Requesting refill of Cetirizine, will deliver ASAP tomorrow

## 2020-12-12 NOTE — Patient Instructions (Signed)
Denosumab injection What is this medication? DENOSUMAB (den oh sue mab) slows bone breakdown. Prolia is used to treat osteoporosis in women after menopause and in men, and in people who are taking corticosteroids for 6 months or more. Delton See is used to treat a high calcium level due to cancer and to prevent bone fractures and other bone problems caused by multiple myeloma or cancer bone metastases. Delton See is also used totreat giant cell tumor of the bone. This medicine may be used for other purposes; ask your health care provider orpharmacist if you have questions. COMMON BRAND NAME(S): Prolia, XGEVA What should I tell my care team before I take this medication? They need to know if you have any of these conditions: dental disease having surgery or tooth extraction infection kidney disease low levels of calcium or Vitamin D in the blood malnutrition on hemodialysis skin conditions or sensitivity thyroid or parathyroid disease an unusual reaction to denosumab, other medicines, foods, dyes, or preservatives pregnant or trying to get pregnant breast-feeding How should I use this medication? This medicine is for injection under the skin. It is given by a health careprofessional in a hospital or clinic setting. A special MedGuide will be given to you before each treatment. Be sure to readthis information carefully each time. For Prolia, talk to your pediatrician regarding the use of this medicine in children. Special care may be needed. For Delton See, talk to your pediatrician regarding the use of this medicine in children. While this drug may be prescribed for children as young as 13 years for selected conditions,precautions do apply. Overdosage: If you think you have taken too much of this medicine contact apoison control center or emergency room at once. NOTE: This medicine is only for you. Do not share this medicine with others. What if I miss a dose? It is important not to miss your dose. Call  your doctor or health careprofessional if you are unable to keep an appointment. What may interact with this medication? Do not take this medicine with any of the following medications: other medicines containing denosumab This medicine may also interact with the following medications: medicines that lower your chance of fighting infection steroid medicines like prednisone or cortisone This list may not describe all possible interactions. Give your health care provider a list of all the medicines, herbs, non-prescription drugs, or dietary supplements you use. Also tell them if you smoke, drink alcohol, or use illegaldrugs. Some items may interact with your medicine. What should I watch for while using this medication? Visit your doctor or health care professional for regular checks on your progress. Your doctor or health care professional may order blood tests andother tests to see how you are doing. Call your doctor or health care professional for advice if you get a fever, chills or sore throat, or other symptoms of a cold or flu. Do not treat yourself. This drug may decrease your body's ability to fight infection. Try toavoid being around people who are sick. You should make sure you get enough calcium and vitamin D while you are taking this medicine, unless your doctor tells you not to. Discuss the foods you eatand the vitamins you take with your health care professional. See your dentist regularly. Brush and floss your teeth as directed. Before youhave any dental work done, tell your dentist you are receiving this medicine. Do not become pregnant while taking this medicine or for 5 months after stopping it. Talk with your doctor or health care professional about your  birth control options while taking this medicine. Women should inform their doctor if they wish to become pregnant or think they might be pregnant. There is a potential for serious side effects to an unborn child. Talk to your health  careprofessional or pharmacist for more information. What side effects may I notice from receiving this medication? Side effects that you should report to your doctor or health care professionalas soon as possible: allergic reactions like skin rash, itching or hives, swelling of the face, lips, or tongue bone pain breathing problems dizziness jaw pain, especially after dental work redness, blistering, peeling of the skin signs and symptoms of infection like fever or chills; cough; sore throat; pain or trouble passing urine signs of low calcium like fast heartbeat, muscle cramps or muscle pain; pain, tingling, numbness in the hands or feet; seizures unusual bleeding or bruising unusually weak or tired Side effects that usually do not require medical attention (report to yourdoctor or health care professional if they continue or are bothersome): constipation diarrhea headache joint pain loss of appetite muscle pain runny nose tiredness upset stomach This list may not describe all possible side effects. Call your doctor for medical advice about side effects. You may report side effects to FDA at1-800-FDA-1088. Where should I keep my medication? This medicine is only given in a clinic, doctor's office, or other health caresetting and will not be stored at home. NOTE: This sheet is a summary. It may not cover all possible information. If you have questions about this medicine, talk to your doctor, pharmacist, orhealth care provider. Cyanocobalamin, Vitamin B12 injection O que  este medicamento? A CIANOCOBALAMINA  uma forma sinttica de vitamina B12. A vitamina B12  essencial para o desenvolvimento de glbulos sanguneos, clulas nervosas e protenas saudveis pelo organismo. Tambm ajuda no metabolismo das gorduras e carboidratos. Este medicamento  usado para tratar pessoas que no conseguem absorver vitamina B12 suficiente. Este medicamento pode ser usado para outros propsitos; em caso  de dvidas, pergunte ao seu profissional de sade ou farmacutico. NOMES DE MARCAS COMUNS: B-12 Compliance Kit, B-12 Injection Kit, Cyomin, LA-12, Nutri-Twelve, Physicians EZ Use B-12, Primabalt O que devo dizer a meu profissional de sade antes de tomar este medicamento? Precisam saber se voc tem algum dos seguintes problemas ou estados de sade: doenas renais sndrome ou doena de Leber anemia megaloblstica reao estranha ou alergia  cianocobalamina ou ao cobalto reao estranha ou alergia a outros medicamentos, alimentos, corantes ou conservantes est grvida ou tentando engravidar est amamentando Como devo usar este medicamento? Este medicamento  injetado por via intramuscular ou por injeo subcutnea profunda. Costuma ser administrado por um profissional de sade em consultrio ou Chief of Staff. Porm,  possvel que seu mdico lhe ensine como aplicar suas prprias injees. Siga todas as instrues. Fale com seu pediatra a respeito do uso deste medicamento em crianas. Pode ser preciso tomar alguns cuidados especiais. Superdosagem: Se achar que tomou uma superdosagem deste medicamento, entre em contato imediatamente com o [facility] ou v a [employer]. OBSERVAO: Este medicamento  s para voc. No compartilhe este medicamento com outras pessoas. E se eu deixar de tomar uma dose? Se toma o medicamento em uma clnica ou no consultrio do seu mdico, ligue para Paramed[employer] a Passenger transport manager. Se aplica as injees por conta prpria e perdeu uma dose, tome-a assim que possvel. Se j estiver quase na hora da sua prxima dose, tome somente essa dose. No tome o remdio em dobro, nem tome uma dose adicional. O  que pode interagir com este medicamento? colchicina consumo pesado de lcool Esta lista pode no descrever todas as interaes possveis. D ao seu profissional de sade uma lista de todos os medicamentos, ervas medicinais, remdios de venda livre, ou suplementos  alimentares que voc [place]. Diga tambm se voc fuma, bebe, ou [place] drogas ilcitas. Alguns destes podem interagir com o seu medicamento. Ao que devo ficar atento quando estiver [employer] medicamento? Consulte seu mdico ou profissional de sade para acompanhamento regular Museum/gallery curator. Voc precisar fazer exames de sangue peridicos enquanto estiver [employer]. Voc pode precisar seguir uma dieta especial. Fale com seu mdico. Para conseguir o mximo benefcio deste medicamento, limite o seu consumo de lcool e evite fumar. Que efeitos colaterais posso sentir aps usar este medicamento? Efeitos colaterais que devem ser informados ao seu mdico ou profissional de sade o mais rpido possvel: reaes alrgicas, como erupo na pele, coceira, urticria, ou inchao do rosto, dos lbios ou da lngua pele azulada dor ou aperto no peito chiado no peito ou dificuldade para respirar tontura rea vermelha, inchada e dolorosa na perna Efeitos colaterais que normalmente no precisam de cuidados mdicos (avise ao seu mdico ou profissional de sade se persistirem ou forem incmodos): diarreia dor de cabea Esta lista pode no descrever todos os efeitos colaterais possveis. Para mais orientaes sobre efeitos colaterais, consulte o seu mdico. Voc pode relatar a ocorrncia de efeitos colaterais  FDA pelo telefone [phone]. Onde devo guardar meu medicamento? [facility] for[facility] do [employer]. Conservar em temperatura ambiente, entre 15 e 30 degreesC [phone] e 86 degreesF). Proteger Administrator, arts. Descartar qualquer medicamento no utilizado aps a data de validade impressa no rtulo ou embalagem. OBSERVAO: Este folheto  um resumo. Pode no cobrir todas as informaes possveis. Se tiver dvidas a respeito deste medicamento, fale com seu mdico, farmacutico ou profissional de sade.  2021 [employer] Standard (2010-02-15 00:00:00)   2022 [employer] Standard (2017-09-20 16:10:44)

## 2020-12-13 ENCOUNTER — Other Ambulatory Visit (HOSPITAL_COMMUNITY): Payer: Self-pay

## 2020-12-13 DIAGNOSIS — C50811 Malignant neoplasm of overlapping sites of right female breast: Secondary | ICD-10-CM | POA: Diagnosis not present

## 2020-12-13 LAB — CANCER ANTIGEN 27.29: CA 27.29: 57.9 U/mL — ABNORMAL HIGH (ref 0.0–38.6)

## 2020-12-14 DIAGNOSIS — C50811 Malignant neoplasm of overlapping sites of right female breast: Secondary | ICD-10-CM | POA: Diagnosis not present

## 2020-12-15 DIAGNOSIS — C50811 Malignant neoplasm of overlapping sites of right female breast: Secondary | ICD-10-CM | POA: Diagnosis not present

## 2020-12-16 DIAGNOSIS — C50811 Malignant neoplasm of overlapping sites of right female breast: Secondary | ICD-10-CM | POA: Diagnosis not present

## 2020-12-19 DIAGNOSIS — C50811 Malignant neoplasm of overlapping sites of right female breast: Secondary | ICD-10-CM | POA: Diagnosis not present

## 2020-12-20 ENCOUNTER — Other Ambulatory Visit (HOSPITAL_COMMUNITY): Payer: Self-pay

## 2020-12-20 DIAGNOSIS — C50811 Malignant neoplasm of overlapping sites of right female breast: Secondary | ICD-10-CM | POA: Diagnosis not present

## 2020-12-20 MED FILL — Palbociclib Tab 75 MG: ORAL | 28 days supply | Qty: 21 | Fill #4 | Status: CN

## 2020-12-21 DIAGNOSIS — C50811 Malignant neoplasm of overlapping sites of right female breast: Secondary | ICD-10-CM | POA: Diagnosis not present

## 2020-12-22 DIAGNOSIS — C50811 Malignant neoplasm of overlapping sites of right female breast: Secondary | ICD-10-CM | POA: Diagnosis not present

## 2020-12-23 DIAGNOSIS — C50811 Malignant neoplasm of overlapping sites of right female breast: Secondary | ICD-10-CM | POA: Diagnosis not present

## 2020-12-26 ENCOUNTER — Other Ambulatory Visit (HOSPITAL_COMMUNITY): Payer: Self-pay

## 2020-12-26 DIAGNOSIS — J9621 Acute and chronic respiratory failure with hypoxia: Secondary | ICD-10-CM | POA: Diagnosis not present

## 2020-12-26 DIAGNOSIS — C7801 Secondary malignant neoplasm of right lung: Secondary | ICD-10-CM | POA: Diagnosis not present

## 2020-12-26 DIAGNOSIS — R0602 Shortness of breath: Secondary | ICD-10-CM | POA: Diagnosis not present

## 2020-12-26 DIAGNOSIS — C50811 Malignant neoplasm of overlapping sites of right female breast: Secondary | ICD-10-CM | POA: Diagnosis not present

## 2020-12-26 DIAGNOSIS — S22000A Wedge compression fracture of unspecified thoracic vertebra, initial encounter for closed fracture: Secondary | ICD-10-CM | POA: Diagnosis not present

## 2020-12-26 MED FILL — Palbociclib Tab 75 MG: ORAL | 21 days supply | Qty: 21 | Fill #4 | Status: AC

## 2020-12-27 DIAGNOSIS — C50811 Malignant neoplasm of overlapping sites of right female breast: Secondary | ICD-10-CM | POA: Diagnosis not present

## 2020-12-28 DIAGNOSIS — C50811 Malignant neoplasm of overlapping sites of right female breast: Secondary | ICD-10-CM | POA: Diagnosis not present

## 2020-12-29 DIAGNOSIS — C50811 Malignant neoplasm of overlapping sites of right female breast: Secondary | ICD-10-CM | POA: Diagnosis not present

## 2020-12-30 DIAGNOSIS — C50811 Malignant neoplasm of overlapping sites of right female breast: Secondary | ICD-10-CM | POA: Diagnosis not present

## 2021-01-02 DIAGNOSIS — C50811 Malignant neoplasm of overlapping sites of right female breast: Secondary | ICD-10-CM | POA: Diagnosis not present

## 2021-01-03 DIAGNOSIS — H0102B Squamous blepharitis left eye, upper and lower eyelids: Secondary | ICD-10-CM | POA: Diagnosis not present

## 2021-01-03 DIAGNOSIS — H40013 Open angle with borderline findings, low risk, bilateral: Secondary | ICD-10-CM | POA: Diagnosis not present

## 2021-01-03 DIAGNOSIS — H43812 Vitreous degeneration, left eye: Secondary | ICD-10-CM | POA: Diagnosis not present

## 2021-01-03 DIAGNOSIS — H0102A Squamous blepharitis right eye, upper and lower eyelids: Secondary | ICD-10-CM | POA: Diagnosis not present

## 2021-01-03 DIAGNOSIS — Z961 Presence of intraocular lens: Secondary | ICD-10-CM | POA: Diagnosis not present

## 2021-01-04 DIAGNOSIS — C50811 Malignant neoplasm of overlapping sites of right female breast: Secondary | ICD-10-CM | POA: Diagnosis not present

## 2021-01-04 NOTE — Progress Notes (Signed)
Suzanne [name]  [phone] [phone]   ID: Suzanne [name] DOB: May 21, 1961  MR#: [id number]  JYN#:[id number]  Patient Care Team: [name], NP as PCP - General (Nurse Practitioner) [name], Virgie Dad, MD as Consulting Physician (Oncology) Netta Cedars, MD as Consulting Physician (Orthopedic Surgery) Melissa Montane, RN as Case Manager Lane Hacker, [facility] as Pharmacist (Pharmacist) OTHER MD:   CHIEF COMPLAINT: Stage IV estrogen receptor positive breast cancer  CURRENT TREATMENT: Anastrozole; Palbociclib; Delton See   INTERVAL HISTORY: Suzanne [name] returns today for follow-up of her metastatic cancer.  She is accompanied by her daughter Suzanne [name] continues on anastrozole.  She is tolerating this well.    She is also on palbociclib at 75 mg daily 21 days on 7 days off.  She tolerates this with no side effects that she is aware of  She receives Xgeva every 4 weeks, last given 10/17/2020. She has no issues with receiving this, and denies any dental concerns.   She was started on B12 replacement 07/28/2020.  Suzanne [name] had some concern over her mom being a little more pale and tired than usual.  But otherwise she is doing well.  She last underwent CT chest in 04/2020.  A repeat scan has been ordered for a few months now, however has not yet been scheduled.  Suzanne [name] is reaching out to the radiology scheduling team today.    Lab Results  Component Value Date   CA2729 57.9 (H) 12/12/2020   CA2729 46.5 (H) 11/14/2020   CA2729 42.2 (H) 10/17/2020   CA2729 42.7 (H) 09/19/2020   CA2729 52.5 (H) 08/22/2020    REVIEW OF SYSTEMS: Review of Systems  Constitutional:  Positive for fatigue. Negative for appetite change, chills, fever and unexpected weight change.  HENT:   Negative for hearing loss, lump/mass and trouble swallowing.   Eyes:  Negative for eye problems and icterus.  Respiratory:  Negative for chest tightness, cough and shortness of breath.    Cardiovascular:  Negative for chest pain, leg swelling and palpitations.  Gastrointestinal:  Negative for abdominal distention, abdominal pain, constipation, diarrhea, nausea and vomiting.  Endocrine: Negative for hot flashes.  Genitourinary:  Negative for difficulty urinating.   Musculoskeletal:  Negative for arthralgias.  Skin:  Negative for itching and rash.  Neurological:  Negative for dizziness, extremity weakness, headaches and numbness.  Hematological:  Negative for adenopathy. Does not bruise/bleed easily.  Psychiatric/Behavioral:  Negative for depression. The patient is not nervous/anxious.      COVID 19 VACCINATION STATUS: Status post Pfizer x2 with booster December 2021   HISTORY OF CURRENT ILLNESS: From the original inpatient consult note:  Suzanne [name] is a 60 year old female from [place], [place] without significant past medical history with exception of obesity, remote history of gallstones, and remote history of mastitis.  The patient presented to the hospital on 10/26/2018 with a 3-week history of worsening shortness of breath and lower extremity edema.  The patient reported that her shortness of breath worsened with exertion.  Her lower extremity edema did not improve with elevation.  She also had an intermittent pr oductive cough with rare sputum production.  The patient was noted to be anemic with a hemoglobin of 4.5 with a clumped platelets noted on admission.  Stool for occult blood was positive.  Patient received 2 units of packed red blood cells.    The patient reported a rash under her breast x1 week which has been foul-smelling.  She also reported that she  has some discomfort and thickening/hardness of her right breast for several months.  She states that her right breast is much larger than her left.  She notes some occasional bloody discharge from her breast once in a while.  A chest x-ray on admission showed diffuse bilateral interstitial opacity of unknown  chronicity, findings could be related to interstitial inflammatory process, atypical/viral pneumonia, or possible metastatic disease.  There are also sclerotic and lytic lesions within the clavicles, bilateral ribs, shoulders which was concerning for diffuse few skeletal metastatic disease.    This prompted a CT scan of the chest with contrast which showed bilateral axillary and mediastinal adenopathy, borderline hilar lymph nodes bilaterally, findings likely flecked metastatic lymphadenopathy, moderate bilateral pleural effusions, extensive interstitial thickening and reticulonodular opacities throughout the lungs, cannot exclude lymphangitic spread of tumor, diffuse skeletal metastases.  The patient's subsequent history is as detailed below.   PAST MEDICAL HISTORY: Past Medical History:  Diagnosis Date   Cancer [facility])    Family history of breast cancer    Family history of colon cancer    Family history of melanoma    Family history of prostate cancer    Gallstones    Glaucoma    Mastitis    reports history of recurrent mastitis   Metastatic breast cancer ([place])    Obesity     PAST SURGICAL HISTORY: Past Surgical History:  Procedure Laterality Date   GLAUCOMA SURGERY  late 1990's   IR THORACENTESIS ASP PLEURAL SPACE W/IMG GUIDE  10/28/2018   IR THORACENTESIS ASP PLEURAL SPACE W/IMG GUIDE  10/29/2018   OPEN REDUCTION INTERNAL FIXATION (ORIF) DISTAL RADIAL FRACTURE Left 01/09/2019   Procedure: OPEN REDUCTION INTERNAL FIXATION (ORIF) HUMERAL FRACTURE;  Surgeon: Netta Cedars, MD;  Location: [facility];  Service: Orthopedics;  Laterality: Left;    FAMILY HISTORY: Family History  Problem Relation Age of Onset   Breast cancer Sister        in her 53's-70s   Heart disease Brother        CABG   Heart disease Brother        CABG   Skin cancer Brother        melanoma   Colon cancer Cousin    Heart attack Mother    Heart attack Father    Prostate cancer Brother   Patient's father passed  away at age 22, and her mother at 64, both from heart attacks. The patient has 8 siblings, 6 brothers and 1 sister.  Her sister was diagnosed with breast cancer. One brother has prostate cancer, another brother had a melanoma removed.    GYNECOLOGIC HISTORY:  Menarche: 60 years old Age at first live birth: 60 years old [place] P 1 LMP age 73 Contraceptive: used for 2-3 years with no problems HRT never used  Hysterectomy? no   SOCIAL HISTORY:  Suzanne [name] worked for an Engineer, manufacturing systems. She is now on disability. She is widowed. She lives at home with her daughter.  Suzanne [name], 37, who is a Psychologist, occupational at [employer] center.  The patient has no grandchildren. She is not a Ambulance person.   ADVANCED DIRECTIVES: not on file; she has the paperwork already. She intends to name her daughter as her HCPOA.   HEALTH MAINTENANCE: Social History   Tobacco Use   Smoking status: Never   Smokeless tobacco: Never   Tobacco comments:    second hand smoke exposure  Vaping Use   Vaping Use: Never used  Substance Use Topics  Alcohol use: No   Drug use: No    Colonoscopy:   PAP:   Bone density:  Mammography:   No Known Allergies  Current Outpatient Medications  Medication Sig Dispense Refill   anastrozole (ARIMIDEX) 1 MG tablet Take 1 tablet (1 mg total) by mouth daily. 90 tablet 4   antiseptic oral rinse (BIOTENE) LIQD 15 mLs by Mouth Rinse route as needed for dry mouth. (Patient not taking: No sig reported)     Blood Pressure Monitor MISC Medium sized cuff. Use to check blood pressure twice per day and more often if not feeling well. I10.0 1 each 0   calcium-vitamin D (OSCAL WITH D) 500-200 MG-UNIT tablet Take 1 tablet by mouth 3 (three) times daily. 90 tablet 1   cetirizine (ZYRTEC) 10 MG tablet Take 1 tablet (10 mg total) by mouth daily. 90 tablet 3   cyanocobalamin ([employer] VITAMIN B12) 2000 MCG tablet Take 1 tablet (2,000 mcg total) by mouth daily. 90 tablet 4   famotidine (PEPCID) 20 MG  tablet Take 1 tablet (20 mg total) by mouth 2 (two) times daily as needed for heartburn or indigestion. 30 tablet 3   ferrous sulfate 325 (65 FE) MG EC tablet TAKE ONE TABLET BY MOUTH THREE TIMES DAILY with meals 90 tablet 3   folic acid (FOLVITE) 1 MG tablet Take 1 tablet (1 mg total) by mouth daily. 90 tablet 4   furosemide (LASIX) 20 MG tablet Take 1 tablet (20 mg total) by mouth daily. 90 tablet 4   ketoconazole (NIZORAL) 2 % cream APPLY TO THE AFFECTED AREA(S) DAILY AS NEEDED 30 g 6   methocarbamol (ROBAXIN) 500 MG tablet Take 500 mg by mouth every 6 (six) hours as needed. (Patient not taking: No sig reported)     nystatin (MYCOSTATIN/NYSTOP) powder Apply 1 application topically as needed. 15 g 1   palbociclib (IBRANCE) 75 MG tablet TAKE 1 TABLET (75 MG TOTAL) BY MOUTH DAILY. TAKE FOR 21 DAYS ON, 7 DAYS OFF, REPEAT EVERY 28 DAYS 21 tablet 6   polyethylene glycol (MIRALAX / GLYCOLAX) 17 g packet Take 17 g by mouth as needed. 14 each 6   sertraline (ZOLOFT) 50 MG tablet TAKE 1 AND 1/2 TABLETS BY MOUTH EVERYDAY AT BEDTIME 135 tablet 0   ZOFRAN 8 MG tablet Take 1 tablet (8 mg total) by mouth every 8 (eight) hours. 20 tablet 1   No current facility-administered medications for this visit.    OBJECTIVE:   Vitals:   01/06/21 1232  BP: (!) 121/48  Pulse: 96  Resp: 19  Temp: (!) 97.5 F (36.4 C)  SpO2: 100%      Body mass index is 39.08 kg/m.   Wt Readings from Last 3 Encounters:  01/06/21 193 lb 8 oz (87.8 kg)  11/14/20 198 lb 4.8 oz (89.9 kg)  09/19/20 199 lb 3.2 oz (90.4 kg)  ECOG FS: 2 GENERAL: Patient is a well appearing female in no acute distress examined in wheelchair HEENT:  Sclerae anicteric.  Mask in place. Neck is supple.  NODES:  No cervical, supraclavicular, or axillary lymphadenopathy palpated.  BREAST EXAM:  Deferred. LUNGS:  Clear to auscultation bilaterally.  No wheezes or rhonchi. HEART:  Regular rate and rhythm. No murmur appreciated. ABDOMEN:  Soft, nontender.   Positive, normoactive bowel sounds. No organomegaly palpated. MSK:  No focal spinal tenderness to palpation.  EXTREMITIES:  No peripheral edema.   SKIN:  Clear with no obvious rashes or skin changes. No nail dyscrasia.  NEURO:  Nonfocal. Well oriented.  Appropriate affect.   LAB RESULTS:  CMP     Component Value Date/Time   NA 140 01/06/2021 1205   K 4.2 01/06/2021 1205   CL 102 01/06/2021 1205   CO2 28 01/06/2021 1205   GLUCOSE 109 (H) 01/06/2021 1205   BUN 16 01/06/2021 1205   CREATININE 1.16 (H) 01/06/2021 1205   CREATININE 0.91 11/14/2020 1254   CALCIUM 9.7 01/06/2021 1205   PROT 7.1 01/06/2021 1205   ALBUMIN 3.6 01/06/2021 1205   AST 18 01/06/2021 1205   AST 20 11/14/2020 1254   ALT 9 01/06/2021 1205   ALT 14 11/14/2020 1254   ALKPHOS 131 (H) 01/06/2021 1205   BILITOT 0.6 01/06/2021 1205   BILITOT 0.2 (L) 11/14/2020 1254   GFRNONAA 54 (L) 01/06/2021 1205   GFRNONAA >60 11/14/2020 1254   GFRAA >60 02/24/2020 1454   GFRAA >60 07/24/2019 1453    No results found for: TOTALPROTELP, ALBUMINELP, A1GS, A2GS, BETS, BETA2SER, GAMS, MSPIKE, SPEI  No results found for: KPAFRELGTCHN, LAMBDASER, KAPLAMBRATIO  Lab Results  Component Value Date   WBC 3.5 (L) 01/06/2021   NEUTROABS 2.4 01/06/2021   HGB 10.3 (L) 01/06/2021   HCT 31.5 (L) 01/06/2021   MCV 115.4 (H) 01/06/2021   PLT 351 01/06/2021   No results found for: LABCA2  No components found for: IRJJOA416  No results for input(s): INR in the last 168 hours.  No results found for: LABCA2  No results found for: SAY301  No results found for: SWF093  No results found for: ATF573  Lab Results  Component Value Date   CA2729 57.9 (H) 12/12/2020    No components found for: HGQUANT  No results found for: CEA1 / No results found for: CEA1   No results found for: AFPTUMOR  No results found for: CHROMOGRNA  No results found for: HGBA, HGBA2QUANT, HGBFQUANT, HGBSQUAN (Hemoglobinopathy evaluation)   Lab  Results  Component Value Date   LDH 215 (H) 07/07/2020    Lab Results  Component Value Date   IRON 40 (L) 07/07/2020   TIBC 448 (H) 07/07/2020   IRONPCTSAT 9 (L) 07/07/2020   (Iron and TIBC)  Lab Results  Component Value Date   FERRITIN 21 07/07/2020    Urinalysis    Component Value Date/Time   COLORURINE YELLOW 04/07/2020 [place] 04/07/2020 1216   LABSPEC 1.026 04/07/2020 1216   PHURINE 5.0 04/07/2020 1216   GLUCOSEU NEGATIVE 04/07/2020 1216   HGBUR NEGATIVE 04/07/2020 1216   BILIRUBINUR NEGATIVE 04/07/2020 [place] 04/07/2020 1216   PROTEINUR NEGATIVE 04/07/2020 1216   NITRITE NEGATIVE 04/07/2020 1216   LEUKOCYTESUR SMALL (A) 04/07/2020 1216    STUDIES:  No results found.   ELIGIBLE FOR AVAILABLE RESEARCH PROTOCOL: no   ASSESSMENT: 60 y.o. [place], [place] woman presenting 10/26/2018 with right-sided inflammatory breast cancer, stage IV, involvnh lungs, lymph nodes and bones, as follows:  (a) chest CT scan 10/26/2018 shows bilateral pleural effusions, possible lymphangitic spread of tumor, diffuse bony metastatic disease, and significant axillary mediastinal and hilar adenopathy  (b) bone scan 10/27/2018 is a "near [place]" consistent with widespread bony metastatic disease  (c) head CT with and without contrast 10/30/2018 shows no intracranial metastatic disease, multiple calvarial lesions  (d) CA-27-29 on 10/27/2018 was 1810.4  (1) pleural fluid from right thoracentesis 10/28/2018 confirms malignant cells consistent with a breast primary, strongly estrogen and progesterone receptor positive, HER-2 not amplified, with an MIB-1 of  2%  (2) anastrozole started 10/29/2018  (a) palbociclib started 11/13/2018 at 100 mg dose  (b) denosumab/xgeva started 11/13/2018   (C) palbociclib dose started  (3) associated problems:  (a) hypoxia secondary to effusions  (b) pain from bone lesions and left humeral and vertebral  compression fractures  (c) right upper extremity lymphedema  (d) poor venous access  (4) genetics testing on 12/25/2018 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 STK11, TP53, TSC1, TSC2, and VHL.  The following genes were evaluated for sequence changes only: SDHA and HOXB13 c.251G>A variant only. The [facility] Melanoma Panel analyzed the following 9 genes: BAP1 BRCA2 CDK4 CDKN2A MITF POT1 PTEN RB1 Tp53.   (5) palliative radiation to the left humerus 07/20/2019 - 07/31/2019 Site Technique Total Dose (Gy) Dose per Fx (Gy) Completed Fx Beam Energies  Humerus, Left: Ext_Lt_humerus Complex 30/30 3 10/10 6X   (6) anemia requiring transfusion:   (a) B-12 level 134 FEB 2022: B-12 injectons started    PLAN: Suzanne [name] is here today for f/u of her metastatic breast cancer.  She has no clinical signs of progression, however it is unclear to me why she hasn't had her restaging scheduled at this point.  Suzanne [name] notes that she is going to call today to get everything scheduled.  Suzanne [name] will return in 4 weeks for f/u to review those results instead of 8 weeks.  Her pale color is not related to anemia.  It could have been a virus or decreased oral intake.  Suzanne [name] is feeling better than she did the day her skin was pale.  I recommended she drink plenty of water and continue to eat every meal.  She understands this.    She will continue on her treatment with her Letrozole and Palbociclib and xgeva.  We will see  her back in 4 weeks.  She knows to call for any questions that may arise between now and her next appointment.  We are happy to see her sooner if needed.   Total encounter time 30 minutes.Suzanne Bihari, NP 01/06/21 3:34 PM Medical Oncology and Hematology [facility] [place], [place] [address] Tel. [phone]    Fax. [phone]   *Total Encounter Time as defined by the Centers for Medicare and Medicaid Services includes, in addition to the face-to-face time of a patient visit (documented in the note above) non-face-to-face time: obtaining and reviewing outside history, ordering and reviewing medications, tests or procedures, care coordination (communications with other health care professionals or caregivers) and documentation in the medical record.

## 2021-01-05 ENCOUNTER — Other Ambulatory Visit: Payer: Self-pay | Admitting: Nurse Practitioner

## 2021-01-05 DIAGNOSIS — C50811 Malignant neoplasm of overlapping sites of right female breast: Secondary | ICD-10-CM | POA: Diagnosis not present

## 2021-01-05 DIAGNOSIS — F329 Major depressive disorder, single episode, unspecified: Secondary | ICD-10-CM

## 2021-01-05 NOTE — Telephone Encounter (Signed)
Requested medication (s) are due for refill today: expired medication  Requested medication (s) are on the active medication list: yes course completed  Last refill:  07/19/20 - 10/17/20 #135 0 refills  Future visit scheduled: yes in 5 months  Notes to clinic:  completed medication, expired 10/17/20. Do you want to renew Rx?     Requested Prescriptions  Pending Prescriptions Disp Refills   sertraline (ZOLOFT) 50 MG tablet [Pharmacy Med Name: sertraline 50 mg tablet] 135 tablet 0    Sig: TAKE 1 AND 1/2 TABLETS BY MOUTH EVERYDAY AT BEDTIME     Psychiatry:  Antidepressants - SSRI Passed - 01/05/2021  4:47 PM      Passed - Valid encounter within last 6 months    Recent Outpatient Visits           1 month ago Primary hypertension   [place], [name], NP   6 months ago Encounter to establish care   [place], [name], NP   1 year ago Hypotension, unspecified hypotension type   [place], [place], NP   1 year ago Hypotension, unspecified hypotension type   [place], MD   1 year ago Cellulitis of toe of right foot   [place], MD       Future Appointments             In 5 months [name], NP [place]

## 2021-01-06 ENCOUNTER — Inpatient Hospital Stay: Payer: Medicaid Other

## 2021-01-06 ENCOUNTER — Encounter: Payer: Self-pay | Admitting: Adult Health

## 2021-01-06 ENCOUNTER — Inpatient Hospital Stay: Payer: Medicaid Other | Attending: Adult Health

## 2021-01-06 ENCOUNTER — Other Ambulatory Visit: Payer: Self-pay

## 2021-01-06 ENCOUNTER — Inpatient Hospital Stay (HOSPITAL_BASED_OUTPATIENT_CLINIC_OR_DEPARTMENT_OTHER): Payer: Medicaid Other | Admitting: Adult Health

## 2021-01-06 VITALS — BP 121/48 | HR 96 | Temp 97.5°F | Resp 19 | Ht 59.0 in | Wt 193.5 lb

## 2021-01-06 DIAGNOSIS — C7951 Secondary malignant neoplasm of bone: Secondary | ICD-10-CM

## 2021-01-06 DIAGNOSIS — Z79899 Other long term (current) drug therapy: Secondary | ICD-10-CM | POA: Insufficient documentation

## 2021-01-06 DIAGNOSIS — Z170 Estrogen receptor positive status [ER+]: Secondary | ICD-10-CM | POA: Diagnosis not present

## 2021-01-06 DIAGNOSIS — E538 Deficiency of other specified B group vitamins: Secondary | ICD-10-CM | POA: Insufficient documentation

## 2021-01-06 DIAGNOSIS — C50811 Malignant neoplasm of overlapping sites of right female breast: Secondary | ICD-10-CM | POA: Diagnosis not present

## 2021-01-06 DIAGNOSIS — G893 Neoplasm related pain (acute) (chronic): Secondary | ICD-10-CM

## 2021-01-06 DIAGNOSIS — R0602 Shortness of breath: Secondary | ICD-10-CM

## 2021-01-06 DIAGNOSIS — C50911 Malignant neoplasm of unspecified site of right female breast: Secondary | ICD-10-CM | POA: Diagnosis present

## 2021-01-06 DIAGNOSIS — D649 Anemia, unspecified: Secondary | ICD-10-CM

## 2021-01-06 DIAGNOSIS — C7802 Secondary malignant neoplasm of left lung: Secondary | ICD-10-CM | POA: Diagnosis not present

## 2021-01-06 DIAGNOSIS — C7801 Secondary malignant neoplasm of right lung: Secondary | ICD-10-CM

## 2021-01-06 DIAGNOSIS — J90 Pleural effusion, not elsewhere classified: Secondary | ICD-10-CM

## 2021-01-06 LAB — COMPREHENSIVE METABOLIC PANEL
ALT: 9 U/L (ref 0–44)
AST: 18 U/L (ref 15–41)
Albumin: 3.6 g/dL (ref 3.5–5.0)
Alkaline Phosphatase: 131 U/L — ABNORMAL HIGH (ref 38–126)
Anion gap: 10 (ref 5–15)
BUN: 16 mg/dL (ref 8–23)
CO2: 28 mmol/L (ref 22–32)
Calcium: 9.7 mg/dL (ref 8.9–10.3)
Chloride: 102 mmol/L (ref 98–111)
Creatinine, Ser: 1.16 mg/dL — ABNORMAL HIGH (ref 0.44–1.00)
GFR, Estimated: 54 mL/min — ABNORMAL LOW (ref >60)
Glucose, Bld: 109 mg/dL — ABNORMAL HIGH (ref 70–99)
Potassium: 4.2 mmol/L (ref 3.5–5.1)
Sodium: 140 mmol/L (ref 135–145)
Total Bilirubin: 0.6 mg/dL (ref 0.3–1.2)
Total Protein: 7.1 g/dL (ref 6.5–8.1)

## 2021-01-06 LAB — CBC WITH DIFFERENTIAL/PLATELET
Abs Immature Granulocytes: 0.01 10*3/uL (ref 0.00–0.07)
Basophils Absolute: 0.1 10*3/uL (ref 0.0–0.1)
Basophils Relative: 3 %
Eosinophils Absolute: 0.1 10*3/uL (ref 0.0–0.5)
Eosinophils Relative: 3 %
HCT: 31.5 % — ABNORMAL LOW (ref 36.0–46.0)
Hemoglobin: 10.3 g/dL — ABNORMAL LOW (ref 12.0–15.0)
Immature Granulocytes: 0 %
Lymphocytes Relative: 19 %
Lymphs Abs: 0.7 10*3/uL (ref 0.7–4.0)
MCH: 37.7 pg — ABNORMAL HIGH (ref 26.0–34.0)
MCHC: 32.7 g/dL (ref 30.0–36.0)
MCV: 115.4 fL — ABNORMAL HIGH (ref 80.0–100.0)
Monocytes Absolute: 0.2 10*3/uL (ref 0.1–1.0)
Monocytes Relative: 6 %
Neutro Abs: 2.4 10*3/uL (ref 1.7–7.7)
Neutrophils Relative %: 69 %
Platelets: 351 10*3/uL (ref 150–400)
RBC: 2.73 MIL/uL — ABNORMAL LOW (ref 3.87–5.11)
RDW: 14.4 % (ref 11.5–15.5)
WBC: 3.5 10*3/uL — ABNORMAL LOW (ref 4.0–10.5)
nRBC: 0 % (ref 0.0–0.2)

## 2021-01-06 LAB — SAMPLE TO BLOOD BANK

## 2021-01-06 MED ORDER — DENOSUMAB 120 MG/1.7ML ~~LOC~~ SOLN
120.0000 mg | Freq: Once | SUBCUTANEOUS | Status: AC
  Administered 2021-01-06: 120 mg via SUBCUTANEOUS
  Filled 2021-01-06: qty 1.7

## 2021-01-06 MED ORDER — CYANOCOBALAMIN 1000 MCG/ML IJ SOLN
1000.0000 ug | Freq: Once | INTRAMUSCULAR | Status: AC
  Administered 2021-01-06: 1000 ug via INTRAMUSCULAR
  Filled 2021-01-06: qty 1

## 2021-01-06 NOTE — Patient Instructions (Signed)

## 2021-01-09 ENCOUNTER — Other Ambulatory Visit: Payer: Medicaid Other

## 2021-01-09 ENCOUNTER — Ambulatory Visit: Payer: Medicaid Other

## 2021-01-09 ENCOUNTER — Ambulatory Visit: Payer: Medicaid Other | Admitting: Adult Health

## 2021-01-11 DIAGNOSIS — C50811 Malignant neoplasm of overlapping sites of right female breast: Secondary | ICD-10-CM | POA: Diagnosis not present

## 2021-01-12 DIAGNOSIS — C50811 Malignant neoplasm of overlapping sites of right female breast: Secondary | ICD-10-CM | POA: Diagnosis not present

## 2021-01-13 DIAGNOSIS — C50811 Malignant neoplasm of overlapping sites of right female breast: Secondary | ICD-10-CM | POA: Diagnosis not present

## 2021-01-16 ENCOUNTER — Encounter (HOSPITAL_COMMUNITY): Payer: Self-pay

## 2021-01-16 ENCOUNTER — Other Ambulatory Visit: Payer: Self-pay

## 2021-01-16 ENCOUNTER — Ambulatory Visit (HOSPITAL_COMMUNITY)
Admission: RE | Admit: 2021-01-16 | Discharge: 2021-01-16 | Disposition: A | Discharge status: Home / Self Care | Payer: Medicaid Other | Source: Ambulatory Visit | Attending: Adult Health | Admitting: Adult Health

## 2021-01-16 DIAGNOSIS — C50811 Malignant neoplasm of overlapping sites of right female breast: Secondary | ICD-10-CM | POA: Insufficient documentation

## 2021-01-16 DIAGNOSIS — C7802 Secondary malignant neoplasm of left lung: Secondary | ICD-10-CM | POA: Diagnosis present

## 2021-01-16 DIAGNOSIS — Z170 Estrogen receptor positive status [ER+]: Secondary | ICD-10-CM | POA: Insufficient documentation

## 2021-01-16 DIAGNOSIS — C7951 Secondary malignant neoplasm of bone: Secondary | ICD-10-CM | POA: Diagnosis present

## 2021-01-16 DIAGNOSIS — C7801 Secondary malignant neoplasm of right lung: Secondary | ICD-10-CM | POA: Diagnosis present

## 2021-01-16 DIAGNOSIS — I700 Atherosclerosis of aorta: Secondary | ICD-10-CM | POA: Diagnosis not present

## 2021-01-16 MED ORDER — IOHEXOL 350 MG/ML SOLN
60.0000 mL | Freq: Once | INTRAVENOUS | Status: AC | PRN
  Administered 2021-01-16: 60 mL via INTRAVENOUS

## 2021-01-17 ENCOUNTER — Other Ambulatory Visit (HOSPITAL_COMMUNITY): Payer: Self-pay

## 2021-01-17 ENCOUNTER — Other Ambulatory Visit: Payer: Self-pay | Admitting: Oncology

## 2021-01-17 DIAGNOSIS — Z170 Estrogen receptor positive status [ER+]: Secondary | ICD-10-CM

## 2021-01-17 DIAGNOSIS — C50811 Malignant neoplasm of overlapping sites of right female breast: Secondary | ICD-10-CM | POA: Diagnosis not present

## 2021-01-17 MED ORDER — PALBOCICLIB 75 MG PO TABS
ORAL_TABLET | ORAL | 6 refills | Status: DC
  Filled 2021-01-24: qty 21, 28d supply, fill #0
  Filled 2021-02-16: qty 21, 28d supply, fill #1
  Filled 2021-03-17: qty 21, 28d supply, fill #2
  Filled 2021-04-13: qty 21, 28d supply, fill #3
  Filled 2021-05-09: qty 21, 28d supply, fill #4
  Filled 2021-06-08: qty 21, 28d supply, fill #5
  Filled 2021-07-06: qty 21, 28d supply, fill #6
  Filled 2021-07-11: qty 21, 21d supply, fill #0
  Filled 2021-07-11: qty 21, 28d supply, fill #6

## 2021-01-18 DIAGNOSIS — C50811 Malignant neoplasm of overlapping sites of right female breast: Secondary | ICD-10-CM | POA: Diagnosis not present

## 2021-01-19 DIAGNOSIS — C50811 Malignant neoplasm of overlapping sites of right female breast: Secondary | ICD-10-CM | POA: Diagnosis not present

## 2021-01-23 DIAGNOSIS — C50811 Malignant neoplasm of overlapping sites of right female breast: Secondary | ICD-10-CM | POA: Diagnosis not present

## 2021-01-24 ENCOUNTER — Encounter: Payer: Self-pay | Admitting: Oncology

## 2021-01-24 ENCOUNTER — Other Ambulatory Visit (HOSPITAL_COMMUNITY): Payer: Self-pay

## 2021-01-24 DIAGNOSIS — C50811 Malignant neoplasm of overlapping sites of right female breast: Secondary | ICD-10-CM | POA: Diagnosis not present

## 2021-01-25 DIAGNOSIS — C50811 Malignant neoplasm of overlapping sites of right female breast: Secondary | ICD-10-CM | POA: Diagnosis not present

## 2021-01-26 ENCOUNTER — Other Ambulatory Visit: Payer: Self-pay | Admitting: *Deleted

## 2021-01-26 ENCOUNTER — Other Ambulatory Visit: Payer: Self-pay

## 2021-01-26 DIAGNOSIS — S22000A Wedge compression fracture of unspecified thoracic vertebra, initial encounter for closed fracture: Secondary | ICD-10-CM | POA: Diagnosis not present

## 2021-01-26 DIAGNOSIS — R0602 Shortness of breath: Secondary | ICD-10-CM | POA: Diagnosis not present

## 2021-01-26 DIAGNOSIS — C7801 Secondary malignant neoplasm of right lung: Secondary | ICD-10-CM | POA: Diagnosis not present

## 2021-01-26 DIAGNOSIS — C50811 Malignant neoplasm of overlapping sites of right female breast: Secondary | ICD-10-CM | POA: Diagnosis not present

## 2021-01-26 DIAGNOSIS — J9621 Acute and chronic respiratory failure with hypoxia: Secondary | ICD-10-CM | POA: Diagnosis not present

## 2021-01-26 NOTE — Patient Outreach (Signed)
Medicaid Managed Care   Nurse Care Manager Note  01/26/2021 Name:  Suzanne [name] MRN:  [id number] DOB:  [birth year]  Suzanne [name] is an 60 y.o. year old female who is a primary [name] of [name], NP.  The [facility] Managed Care Coordination team was consulted for assistance with:    Breast Cancer  Suzanne [name] was given information about Medicaid Managed Care Coordination team services today. Suzanne [name] agreed to services and verbal consent obtained.  Engaged with [name] by telephone for follow up visit in response to provider referral for case management and/or care coordination services.   Assessments/Interventions:  Review of past medical history, allergies, medications, health status, including review of consultants reports, laboratory and other test data, was performed as part of comprehensive evaluation and provision of chronic care management services.  SDOH (Social Determinants of Health) assessments and interventions performed: SDOH Interventions    Flowsheet Row Most Recent Value  SDOH Interventions   Food Insecurity Interventions Other (Comment)  [[name] is working on applying for food benefits. She is attempting to cut her monthly bills to help.]  Housing Interventions Intervention Not Indicated       Care Plan  No Known Allergies  Medications Reviewed Today     Reviewed by Melissa Montane, RN (Registered Nurse) on 01/26/21 at [place] List Status: <None>   Medication Order Taking? Sig Documenting Provider Last Dose Status Informant  anastrozole (ARIMIDEX) 1 MG tablet [id number] No Take 1 tablet (1 mg total) by mouth daily. [name], Virgie Dad, MD Taking Active   antiseptic oral rinse Charlene Brooke) LIQD [id number] No 15 mLs by Mouth Rinse route as needed for dry mouth.  [name] not taking: No sig reported   [provider] Not Taking Active   Blood Pressure Monitor MISC [id number] No Medium sized cuff. Use to check blood pressure twice per  day and more often if not feeling well. I10.0 [name], NP Taking Active   calcium-vitamin D (OSCAL WITH D) 500-200 MG-UNIT tablet [id number] No Take 1 tablet by mouth 3 (three) times daily. [name], Virgie Dad, MD Taking Active   cetirizine (ZYRTEC) 10 MG tablet [id number] No Take 1 tablet (10 mg total) by mouth daily. [name], NP Taking Expired 10/17/20 2359   cyanocobalamin ([employer] VITAMIN B12) 2000 MCG tablet [id number] No Take 1 tablet (2,000 mcg total) by mouth daily. [name], Virgie Dad, MD Taking Active   famotidine (PEPCID) 20 MG tablet [id number] No Take 1 tablet (20 mg total) by mouth 2 (two) times daily as needed for heartburn or indigestion. [name], Virgie Dad, MD Taking Active   ferrous sulfate 325 (65 FE) MG EC tablet [id number] No TAKE ONE TABLET BY MOUTH THREE TIMES DAILY with meals [name], Virgie Dad, MD Taking Active   folic acid (FOLVITE) 1 MG tablet [id number] No Take 1 tablet (1 mg total) by mouth daily. [name], Virgie Dad, MD Taking Active   furosemide (LASIX) 20 MG tablet [id number] No Take 1 tablet (20 mg total) by mouth daily. [name], Virgie Dad, MD Taking Active   ketoconazole (NIZORAL) 2 % cream [id number] No APPLY TO THE AFFECTED AREA(S) DAILY AS NEEDED [name], Virgie Dad, MD Taking Active   methocarbamol (ROBAXIN) 500 MG tablet [id number] No Take 500 mg by mouth every 6 (six) hours as needed.  [name] not taking: No sig reported   [provider] Not Taking Active   nystatin (MYCOSTATIN/NYSTOP) powder [id number] No Apply 1 application topically as needed.  Gardenia Phlegm, NP Taking Active   palbociclib [facility]) 75 MG tablet [id number]  TAKE 1 TABLET (75 MG TOTAL) BY MOUTH DAILY. TAKE FOR 21 DAYS ON, 7 DAYS OFF, REPEAT EVERY 28 DAYS [name], Virgie Dad, MD  Active   polyethylene glycol (MIRALAX / GLYCOLAX) 17 g packet [id number] No Take 17 g by mouth as needed. [name], Virgie Dad, MD Taking Active   sertraline (ZOLOFT) 50 MG tablet [id number]  TAKE 1 AND  1/2 TABLETS BY MOUTH EVERYDAY AT BEDTIME [name], NP  Active   ZOFRAN 8 MG tablet [id number] No Take 1 tablet (8 mg total) by mouth every 8 (eight) hours. Gardenia Phlegm, NP Taking Active   Med List Note [name], RPH-CPP 11/13/19 [id number]): [name] filled at [place]            [name] Active Problem List   Diagnosis Date Noted   B12 deficiency anemia 08/15/2020   Intractable back pain 02/03/2019   Thoracic compression fracture, closed, initial encounter ([place]) 02/03/2019   Essential hypertension 02/03/2019   Genetic testing 01/27/2019   Acute pharyngitis    Bacteremia    FTT (failure to thrive) in adult    Fracture closed, humerus, shaft 01/09/2019   Pneumonia 12/25/2018   Acute respiratory disorder in immunocompromised [name] ([place]) 12/25/2018   Family history of breast cancer    Family history of prostate cancer    Family history of melanoma    Family history of colon cancer    Pressure injury of skin 11/24/2018   Chronic respiratory failure with hypoxia and hypercapnia ([place]) 11/19/2018   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0-44.9, adult ([place]) 10/31/2018   Bone injury    Pleural effusion, bilateral    Shortness of breath    Abnormal breast finding    Hypokalemia    Breast skin changes    Macrocytic anemia    Bone metastasis (HCC)    Anemia, B12 deficiency  10/26/2018    Conditions to be addressed/monitored per PCP order:   Breast Cancer  Care Plan : General Plan of Care (Adult)  Updates made by Melissa Montane, RN since 01/26/2021 12:00 AM  Completed 01/26/2021   Problem: Health Promotion or Disease Self-Management (General Plan of Care) Resolved 10/26/2020     Care Plan : RN Care Manager Plan of Care  Updates made by Melissa Montane, RN since 01/26/2021 12:00 AM     Problem: Health Management Coordination Needs      Long-Range Goal: Development of Plan of Care for needs related to Health Management   Start Date: 01/26/2021  Expected End Date: 05/26/2021  Priority: High  Note:   Current Barriers:  Chronic Disease Management support and education needs related to Health Maintenance-Ms. Ace is working on applying for food benefits, but would like to decrease her monthly bills to help with affording groceries. She and her daughter live together, neither drive and have to pay  for transportation to get groceries-which adds to her grocery bill. Ms. Sorace would like to schedule a mammogram, but lost the number for scheduling given to her by Dr. Jana Hakim. She is working with Ovid Curd for medication management.  RNCM Clinical Goal(s):  [name] will verbalize understanding of plan for management of Health Maintenance attend all scheduled medical appointments: Oncology 9/9 continue to work with RN Care Manager to address care management and care coordination needs related to Health Maintenance  through collaboration with RN Care manager, provider, and care team.   Interventions: Inter-disciplinary care team collaboration (see longitudinal plan of care) Evaluation of current treatment plan related to  self management and [name]'s adherence to plan as established by provider   Health Maintenance  (Status: New goal.) Evaluation of current treatment plan related to  Health Maintenance ,  self-management and [name]'s adherence to plan as established by  provider. Discussed plans with [name] for ongoing care management follow up and provided [name] with direct contact information for care management team Advised [name] to contact [place] for benefits, such as free phone and rewards; Reviewed scheduled/upcoming provider appointments including Oncology 9/9; Social Work referral for assistance with transportation for errands such as grocery shopping; Discussed plans with [name] for ongoing care management follow up and provided [name] with direct contact information for care management team; Provided [name] with contact information to [place] [phone]  [name] Goals/Self-Care Activities: [name] will self administer medications as prescribed [name] will attend all scheduled provider appointments [name] will call pharmacy for medication refills [name] will call provider office for new concerns or questions [name] will work with BSW to address care coordination needs and will continue to work with the clinical team to address health care and disease management related needs.         Follow Up:  [name] agrees to Care Plan and Follow-up.  Plan: The Managed Medicaid care management team will reach out to the [name] again over the next 60 days.  Date/time of next scheduled RN care management/care coordination outreach:  03/27/21 @ 3:30pm  [name] RN, [place] RN Care Coordinator

## 2021-01-26 NOTE — Patient Instructions (Signed)
Visit Information  Ms. Sabree was given information about Medicaid Managed Care team care coordination services as a part of their [place] Medicaid benefit. [name] verbally consented to engagement with the [facility] Managed Care team.   If you are experiencing a medical emergency, please call 911 or report to your local emergency department or urgent care.   If you have a non-emergency medical problem during routine business hours, please contact your provider's office and ask to speak with a nurse.   For questions related to your [facility], please call: [phone] or visit the homepage here: [email or web address]  If you would like to schedule transportation through your [facility], please call the following number at least 2 days in advance of your appointment: [phone].   Call the [place] at [phone], at any time, 24 hours a day, 7 days a week. If you are in danger or need immediate medical attention call 911.  If you would like help to quit smoking, call 1-800-QUIT-NOW 239-725-6760) OR Espaol: 1-855-Djelo-Ya (7-680-881-1031) o para ms informacin haga clic aqu or Text READY to 200-400 to register via text  Ms. Mast - following are the goals we discussed in your visit today:   Goals Addressed   None     Please see education materials related to mammogram provided by MyChart link.  Patient is able to view provided education via MyChart  Telephone follow up appointment with Managed Medicaid care management team member scheduled for:03/27/21 @ 3:30pm  [name] RN, BSN [place] RN Care Coordinator   Following is a copy of your plan of care:  Patient Care Plan: General Plan of Care (Adult)  Completed 01/26/2021   Problem Identified: Health Promotion or Disease Self-Management  (General Plan of Care) Resolved 10/26/2020     Long-Range Goal: Self-Management Plan Developed Completed 10/26/2020  Start Date: 07/14/2020  Expected End Date: 10/26/2020  This Visit's Progress: On track  Recent Progress: On track  Priority: High  Note:   Current Barriers:  Care Coordination needs related to managing breast cancer treatment. Ms. Shroff is being treated for stage IV breast cancer. She and her daughter live together. Ms. Watling is unable to drive and her daughter does not drive. This makes it difficult to get groceries and OTC medications. They have had groceries delivered, which is very expensive after paying the extra fees. Ms Aybar is able to walk around in the house, but requires a wheelchair when going to the [place] for appointments. Her wheelchair is missing a screw on the leg support and the brake is not working correctly. They have called [employer] for assistance with fixing the wheelchair and were told they would need to bring the wheelchair to the office, which is in [facility]. RNCM following up on wheelchair. Patient has not been contacted by [employer]. Ms. Graw reports receiving a new wheelchair from [place]. She has been working with Ovid Curd and now her medications are being delivered, which is a big help. Ms. Rayos is needing to schedule a dental exam and needs a list of providers accepting Medicaid. She has not had a chance to follow up on resources received from [place], but plans to do so. Update-Ms. Delgadillo has not had a chance to schedule a dental exam or apply for food benefits. She has plans to apply this week. She is getting PCS M-F for 2-2.5 hours a day, which is helpful.  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  Transportation barriers Difficulty obtaining medications Food Insecurities  Nurse Case Manager Clinical Goal(s):  patient will work with Care Guide to address needs related to procuring groceries and assistance applying for food  benefits-Met patient will meet with RN Care Manager to address barriers to managing her healthcare at home-Met patient will work with CM team pharmacist for medication management-Met  Interventions:  Inter-disciplinary care team collaboration (see longitudinal plan of care) Evaluation of current treatment plan related to anemia and patient's adherence to plan as established by provider. Reviewed medications with patient and discussed iron supplements Collaborated with Ovid Curd regarding patient having medication questions Discussed plans with patient for ongoing care management follow up and provided patient with direct contact information for care management team Provided patient with MyChart educational materials related to iron-rich foods Provided patient with Dental office accepting Medicaid-[facility] [phone] BSW spoke with patient. BSW provided patient with the website epass.uMourn.cz to apply for foodstamps online and the phone number for Aging and Disability Services [phone]. Update: BSW spoke with patient to follow up on resources provided. Patient stated she has not had the chance to contact the resources yet, but plans on doing so soon. Patient stated no other resources were needed at this time.  Patient Goals/Self-Care Activities - begin a notebook of services in my neighborhood or community - call 211 when I need some help - follow-up on any referrals for help I am given - have a back-up plan - make a list of family or friends that I can call - call [place] [phone] to schedule a dental exam - call [phone] for medical transportation provided by [facility] - call to cancel if needed - keep a calendar with prescription refill dates - keep a calendar with appointment dates    Follow Up Plan: Telephone follow up appointment with Managed Medicaid care management team member scheduled for:10/18/20 @ 3:30pm       Patient Care  Plan: Medication Management     Problem Identified: Health Promotion or Disease Self-Management (General Plan of Care)      Goal: Medication Management   Note:   Evidence-based guidance:  Review biopsychosocial determinants of health screens.  Determine level of modifiable health risk.  Assess level of patient activation, level of readiness, importance and confidence to make changes.  Evoke change talk using open-ended questions, pros and cons, as well as looking forward.  Identify areas where behavior change may lead to improved health.  Partner with patient to develop a robust self-management plan that includes lifestyle factors, such as weight loss, exercise and healthy nutrition, as well as goals specific to disease risks.  Support patient and family/caregiver active participation in decision-making and self-management plan.  Implement additional goals and interventions based on identified risk factors to reduce health risk.  Facilitate advance care planning.  Review need for preventive screening based on age, sex, family history and health history.   Notes:      Patient Care Plan: RN Care Manager Plan of Care     Problem Identified: Health Management Coordination Needs      Long-Range Goal: Development of Plan of Care for needs related to Health Management   Start Date: 01/26/2021  Expected End Date: 05/26/2021  Priority: High  Note:   Current Barriers:  Chronic Disease Management support and education needs related to Health Maintenance-Ms. Perren is working on applying for food benefits, but would like to decrease her  monthly bills to help with affording groceries. She and her daughter live together, neither drive and have to pay for transportation to get groceries-which adds to her grocery bill. Ms. Panik would like to schedule a mammogram, but lost the number for scheduling given to her by Dr. Jana Hakim. She is working with Ovid Curd for medication management.  RNCM Clinical  Goal(s):  Patient will verbalize understanding of plan for management of Health Maintenance attend all scheduled medical appointments: Oncology 9/9 continue to work with RN Care Manager to address care management and care coordination needs related to Health Maintenance  through collaboration with RN Care manager, provider, and care team.   Interventions: Inter-disciplinary care team collaboration (see longitudinal plan of care) Evaluation of current treatment plan related to  self management and patient's adherence to plan as established by provider   Health Maintenance  (Status: New goal.) Evaluation of current treatment plan related to  Health Maintenance ,  self-management and patient's adherence to plan as established by provider. Discussed plans with patient for ongoing care management follow up and provided patient with direct contact information for care management team Advised patient to contact [place] for benefits, such as free phone and rewards; Reviewed scheduled/upcoming provider appointments including Oncology 9/9; Social Work referral for assistance with transportation for errands such as grocery shopping; Discussed plans with patient for ongoing care management follow up and provided patient with direct contact information for care management team; Provided patient with contact information to [facility] [phone]  Patient Goals/Self-Care Activities: Patient will self administer medications as prescribed Patient will attend all scheduled provider appointments Patient will call pharmacy for medication refills Patient will call provider office for new concerns or questions Patient will work with BSW to address care coordination needs and will continue to work with the clinical team to address health care and disease management related needs.

## 2021-01-27 ENCOUNTER — Other Ambulatory Visit: Payer: Self-pay | Admitting: Adult Health

## 2021-01-27 DIAGNOSIS — C50911 Malignant neoplasm of unspecified site of right female breast: Secondary | ICD-10-CM

## 2021-01-27 DIAGNOSIS — C50811 Malignant neoplasm of overlapping sites of right female breast: Secondary | ICD-10-CM | POA: Diagnosis not present

## 2021-01-27 NOTE — Progress Notes (Signed)
Called patient and reviewed normal scan results and apologized for the delay in calling her due to my being out of the office.  She has no new cancer and is happy to hear this result.  She also says that she needs her mammogrma, so I placed orders for her to receive one.  Suzanne [name] is scheduled for f/u next week and we will review her fatigue and other concerns at that time.    Wilber Bihari, NP

## 2021-01-30 DIAGNOSIS — C50811 Malignant neoplasm of overlapping sites of right female breast: Secondary | ICD-10-CM | POA: Diagnosis not present

## 2021-01-31 DIAGNOSIS — C50811 Malignant neoplasm of overlapping sites of right female breast: Secondary | ICD-10-CM | POA: Diagnosis not present

## 2021-02-01 DIAGNOSIS — C50811 Malignant neoplasm of overlapping sites of right female breast: Secondary | ICD-10-CM | POA: Diagnosis not present

## 2021-02-02 DIAGNOSIS — C50811 Malignant neoplasm of overlapping sites of right female breast: Secondary | ICD-10-CM | POA: Diagnosis not present

## 2021-02-03 ENCOUNTER — Other Ambulatory Visit: Payer: Self-pay | Admitting: Adult Health

## 2021-02-03 ENCOUNTER — Inpatient Hospital Stay: Payer: Medicaid Other

## 2021-02-03 ENCOUNTER — Inpatient Hospital Stay: Payer: Medicaid Other | Attending: Adult Health

## 2021-02-03 ENCOUNTER — Other Ambulatory Visit: Payer: Self-pay

## 2021-02-03 ENCOUNTER — Encounter: Payer: Self-pay | Admitting: Adult Health

## 2021-02-03 ENCOUNTER — Inpatient Hospital Stay (HOSPITAL_BASED_OUTPATIENT_CLINIC_OR_DEPARTMENT_OTHER): Payer: Medicaid Other | Admitting: Adult Health

## 2021-02-03 VITALS — BP 129/76 | HR 86 | Temp 97.8°F | Resp 19 | Ht 59.0 in | Wt 196.0 lb

## 2021-02-03 DIAGNOSIS — E538 Deficiency of other specified B group vitamins: Secondary | ICD-10-CM | POA: Insufficient documentation

## 2021-02-03 DIAGNOSIS — C7951 Secondary malignant neoplasm of bone: Secondary | ICD-10-CM | POA: Insufficient documentation

## 2021-02-03 DIAGNOSIS — Z170 Estrogen receptor positive status [ER+]: Secondary | ICD-10-CM | POA: Diagnosis not present

## 2021-02-03 DIAGNOSIS — C7801 Secondary malignant neoplasm of right lung: Secondary | ICD-10-CM | POA: Diagnosis not present

## 2021-02-03 DIAGNOSIS — R0602 Shortness of breath: Secondary | ICD-10-CM

## 2021-02-03 DIAGNOSIS — C50911 Malignant neoplasm of unspecified site of right female breast: Secondary | ICD-10-CM | POA: Insufficient documentation

## 2021-02-03 DIAGNOSIS — C50811 Malignant neoplasm of overlapping sites of right female breast: Secondary | ICD-10-CM

## 2021-02-03 DIAGNOSIS — D649 Anemia, unspecified: Secondary | ICD-10-CM

## 2021-02-03 DIAGNOSIS — C7802 Secondary malignant neoplasm of left lung: Secondary | ICD-10-CM | POA: Diagnosis not present

## 2021-02-03 DIAGNOSIS — J90 Pleural effusion, not elsewhere classified: Secondary | ICD-10-CM

## 2021-02-03 LAB — CBC WITH DIFFERENTIAL/PLATELET
Abs Immature Granulocytes: 0.01 10*3/uL (ref 0.00–0.07)
Basophils Absolute: 0.1 10*3/uL (ref 0.0–0.1)
Basophils Relative: 3 %
Eosinophils Absolute: 0.1 10*3/uL (ref 0.0–0.5)
Eosinophils Relative: 3 %
HCT: 29.6 % — ABNORMAL LOW (ref 36.0–46.0)
Hemoglobin: 9.6 g/dL — ABNORMAL LOW (ref 12.0–15.0)
Immature Granulocytes: 0 %
Lymphocytes Relative: 22 %
Lymphs Abs: 0.7 10*3/uL (ref 0.7–4.0)
MCH: 37.4 pg — ABNORMAL HIGH (ref 26.0–34.0)
MCHC: 32.4 g/dL (ref 30.0–36.0)
MCV: 115.2 fL — ABNORMAL HIGH (ref 80.0–100.0)
Monocytes Absolute: 0.2 10*3/uL (ref 0.1–1.0)
Monocytes Relative: 6 %
Neutro Abs: 2.2 10*3/uL (ref 1.7–7.7)
Neutrophils Relative %: 66 %
Platelets: 311 10*3/uL (ref 150–400)
RBC: 2.57 MIL/uL — ABNORMAL LOW (ref 3.87–5.11)
RDW: 14.6 % (ref 11.5–15.5)
WBC: 3.3 10*3/uL — ABNORMAL LOW (ref 4.0–10.5)
nRBC: 0 % (ref 0.0–0.2)

## 2021-02-03 LAB — COMPREHENSIVE METABOLIC PANEL
ALT: 10 U/L (ref 0–44)
AST: 18 U/L (ref 15–41)
Albumin: 3.4 g/dL — ABNORMAL LOW (ref 3.5–5.0)
Alkaline Phosphatase: 108 U/L (ref 38–126)
Anion gap: 10 (ref 5–15)
BUN: 17 mg/dL (ref 8–23)
CO2: 29 mmol/L (ref 22–32)
Calcium: 9.5 mg/dL (ref 8.9–10.3)
Chloride: 105 mmol/L (ref 98–111)
Creatinine, Ser: 1.03 mg/dL — ABNORMAL HIGH (ref 0.44–1.00)
GFR, Estimated: >60 mL/min (ref >60)
Glucose, Bld: 88 mg/dL (ref 70–99)
Potassium: 3.2 mmol/L — ABNORMAL LOW (ref 3.5–5.1)
Sodium: 144 mmol/L (ref 135–145)
Total Bilirubin: 0.4 mg/dL (ref 0.3–1.2)
Total Protein: 6.5 g/dL (ref 6.5–8.1)

## 2021-02-03 MED ORDER — CYANOCOBALAMIN 1000 MCG/ML IJ SOLN
1000.0000 ug | Freq: Once | INTRAMUSCULAR | Status: AC
  Administered 2021-02-03: 1000 ug via INTRAMUSCULAR
  Filled 2021-02-03: qty 1

## 2021-02-03 MED ORDER — DENOSUMAB 120 MG/1.7ML ~~LOC~~ SOLN
120.0000 mg | Freq: Once | SUBCUTANEOUS | Status: AC
  Administered 2021-02-03: 120 mg via SUBCUTANEOUS
  Filled 2021-02-03: qty 1.7

## 2021-02-03 NOTE — Progress Notes (Signed)
[place]  [phone]) [phone] [phone]   ID: Suzanne [name] DOB: [birth year]  MR#: [id number]  BDZ#:[id number]  Patient Care Team: [name], NP as PCP - General (Nurse Practitioner) [name], Virgie Dad, MD as Consulting Physician (Oncology) Netta Cedars, MD as Consulting Physician (Orthopedic Surgery) Melissa Montane, RN as Case Manager Lane Hacker, [facility] as Pharmacist (Pharmacist) OTHER MD:   CHIEF COMPLAINT: Stage IV estrogen receptor positive breast cancer  CURRENT TREATMENT: Anastrozole; Palbociclib; Delton See   INTERVAL HISTORY: Suzanne [name] returns today for follow-up of her metastatic cancer.  She is accompanied by her daughter Suzanne [name] continues on anastrozole.  She is tolerating this well.    She is also on palbociclib at 75 mg daily 21 days on 7 days off.  She tolerates this with no side effects that she is aware of.  She just completed her first week of taking the Palbociclib and will start week 2 tomorrow.    She receives Niger every 4 weeks, last given 10/17/2020. She has no issues with receiving this, and denies any dental concerns.   She was started on B12 replacement 07/28/2020.   Suzanne [name] will be standing for a few minutes and then feel light headed, and after a couple of minutes this will improve.  Her daughter Suzanne [name] notes it has happened 3 times this past week.    Lab Results  Component Value Date   CA2729 57.9 (H) 12/12/2020   CA2729 46.5 (H) 11/14/2020   CA2729 42.2 (H) 10/17/2020   CA2729 42.7 (H) 09/19/2020   CA2729 52.5 (H) 08/22/2020    REVIEW OF SYSTEMS: Review of Systems  Constitutional:  Positive for fatigue. Negative for appetite change, chills, fever and unexpected weight change.  HENT:   Negative for hearing loss, lump/mass and trouble swallowing.   Eyes:  Negative for eye problems and icterus.  Respiratory:  Negative for chest tightness, cough and shortness of breath.   Cardiovascular:  Negative  for chest pain, leg swelling and palpitations.  Gastrointestinal:  Negative for abdominal distention, abdominal pain, constipation, diarrhea, nausea and vomiting.  Endocrine: Negative for hot flashes.  Genitourinary:  Negative for difficulty urinating.   Musculoskeletal:  Negative for arthralgias.  Skin:  Negative for itching and rash.  Neurological:  Positive for dizziness. Negative for extremity weakness, headaches and numbness.  Hematological:  Negative for adenopathy. Does not bruise/bleed easily.  Psychiatric/Behavioral:  Negative for depression. The patient is not nervous/anxious.      COVID 19 VACCINATION STATUS: Status post Pfizer x2 with booster December 2021   HISTORY OF CURRENT ILLNESS: From the original inpatient consult note:  Ms. Ackert is a 60 year old female from [place], [place] without significant past medical history with exception of obesity, remote history of gallstones, and remote history of mastitis.  The patient presented to the hospital on 10/26/2018 with a 3-week history of worsening shortness of breath and lower extremity edema.  The patient reported that her shortness of breath worsened with exertion.  Her lower extremity edema did not improve with elevation.  She also had an intermittent pr oductive cough with rare sputum production.  The patient was noted to be anemic with a hemoglobin of 4.5 with a clumped platelets noted on admission.  Stool for occult blood was positive.  Patient received 2 units of packed red blood cells.    The patient reported a rash under her breast x1 week which has been foul-smelling.  She also reported that she has some discomfort  and thickening/hardness of her right breast for several months.  She states that her right breast is much larger than her left.  She notes some occasional bloody discharge from her breast once in a while.  A chest x-ray on admission showed diffuse bilateral interstitial opacity of unknown chronicity,  findings could be related to interstitial inflammatory process, atypical/viral pneumonia, or possible metastatic disease.  There are also sclerotic and lytic lesions within the clavicles, bilateral ribs, shoulders which was concerning for diffuse few skeletal metastatic disease.    This prompted a CT scan of the chest with contrast which showed bilateral axillary and mediastinal adenopathy, borderline hilar lymph nodes bilaterally, findings likely flecked metastatic lymphadenopathy, moderate bilateral pleural effusions, extensive interstitial thickening and reticulonodular opacities throughout the lungs, cannot exclude lymphangitic spread of tumor, diffuse skeletal metastases.  The patient's subsequent history is as detailed below.   PAST MEDICAL HISTORY: Past Medical History:  Diagnosis Date   Cancer [facility])    Family history of breast cancer    Family history of colon cancer    Family history of melanoma    Family history of prostate cancer    Gallstones    Glaucoma    Mastitis    reports history of recurrent mastitis   Metastatic breast cancer ([place])    Obesity     PAST SURGICAL HISTORY: Past Surgical History:  Procedure Laterality Date   GLAUCOMA SURGERY  late 1990's   IR THORACENTESIS ASP PLEURAL SPACE W/IMG GUIDE  10/28/2018   IR THORACENTESIS ASP PLEURAL SPACE W/IMG GUIDE  10/29/2018   OPEN REDUCTION INTERNAL FIXATION (ORIF) DISTAL RADIAL FRACTURE Left 01/09/2019   Procedure: OPEN REDUCTION INTERNAL FIXATION (ORIF) HUMERAL FRACTURE;  Surgeon: Netta Cedars, MD;  Location: [facility];  Service: Orthopedics;  Laterality: Left;    FAMILY HISTORY: Family History  Problem Relation Age of Onset   Breast cancer Sister        in her 9's-70s   Heart disease Brother        CABG   Heart disease Brother        CABG   Skin cancer Brother        melanoma   Colon cancer Cousin    Heart attack Mother    Heart attack Father    Prostate cancer Brother   Patient's father passed away at age  22, and her mother at 27, both from heart attacks. The patient has 8 siblings, 6 brothers and 1 sister.  Her sister was diagnosed with breast cancer. One brother has prostate cancer, another brother had a melanoma removed.    GYNECOLOGIC HISTORY:  Menarche: 60 years old Age at first live birth: 60 years old Victor P 1 LMP age 23 Contraceptive: used for 2-3 years with no problems HRT never used  Hysterectomy? no   SOCIAL HISTORY:  Xochilt worked for an Engineer, manufacturing systems. She is now on disability. She is widowed. She lives at home with her daughter.  Suzanne [name], 37, who is a Psychologist, occupational at [employer] center.  The patient has no grandchildren. She is not a Ambulance person.   ADVANCED DIRECTIVES: not on file; she has the paperwork already. She intends to name her daughter as her HCPOA.   HEALTH MAINTENANCE: Social History   Tobacco Use   Smoking status: Never   Smokeless tobacco: Never   Tobacco comments:    second hand smoke exposure  Vaping Use   Vaping Use: Never used  Substance Use Topics   Alcohol use:  No   Drug use: No    Colonoscopy:   PAP:   Bone density:  Mammography:   No Known Allergies  Current Outpatient Medications  Medication Sig Dispense Refill   anastrozole (ARIMIDEX) 1 MG tablet Take 1 tablet (1 mg total) by mouth daily. 90 tablet 4   antiseptic oral rinse (BIOTENE) LIQD 15 mLs by Mouth Rinse route as needed for dry mouth. (Patient not taking: No sig reported)     Blood Pressure Monitor MISC Medium sized cuff. Use to check blood pressure twice per day and more often if not feeling well. I10.0 1 each 0   calcium-vitamin D (OSCAL WITH D) 500-200 MG-UNIT tablet Take 1 tablet by mouth 3 (three) times daily. 90 tablet 1   cetirizine (ZYRTEC) 10 MG tablet Take 1 tablet (10 mg total) by mouth daily. 90 tablet 3   cyanocobalamin ([employer] VITAMIN B12) 2000 MCG tablet Take 1 tablet (2,000 mcg total) by mouth daily. 90 tablet 4   famotidine (PEPCID) 20 MG tablet Take 1  tablet (20 mg total) by mouth 2 (two) times daily as needed for heartburn or indigestion. 30 tablet 3   ferrous sulfate 325 (65 FE) MG EC tablet TAKE ONE TABLET BY MOUTH THREE TIMES DAILY with meals 90 tablet 3   folic acid (FOLVITE) 1 MG tablet Take 1 tablet (1 mg total) by mouth daily. 90 tablet 4   furosemide (LASIX) 20 MG tablet Take 1 tablet (20 mg total) by mouth daily. 90 tablet 4   ketoconazole (NIZORAL) 2 % cream APPLY TO THE AFFECTED AREA(S) DAILY AS NEEDED 30 g 6   methocarbamol (ROBAXIN) 500 MG tablet Take 500 mg by mouth every 6 (six) hours as needed. (Patient not taking: No sig reported)     nystatin (MYCOSTATIN/NYSTOP) powder Apply 1 application topically as needed. 15 g 1   palbociclib (IBRANCE) 75 MG tablet TAKE 1 TABLET (75 MG TOTAL) BY MOUTH DAILY. TAKE FOR 21 DAYS ON, 7 DAYS OFF, REPEAT EVERY 28 DAYS 21 tablet 6   polyethylene glycol (MIRALAX / GLYCOLAX) 17 g packet Take 17 g by mouth as needed. 14 each 6   sertraline (ZOLOFT) 50 MG tablet TAKE 1 AND 1/2 TABLETS BY MOUTH EVERYDAY AT BEDTIME 135 tablet 0   ZOFRAN 8 MG tablet Take 1 tablet (8 mg total) by mouth every 8 (eight) hours. 20 tablet 1   No current facility-administered medications for this visit.    OBJECTIVE:   Vitals:   02/03/21 1249  BP: 129/76  Pulse: 86  Resp: 19  Temp: 97.8 F (36.6 C)  SpO2: 98%      Body mass index is 39.59 kg/m.   Wt Readings from Last 3 Encounters:  02/03/21 196 lb (88.9 kg)  01/06/21 193 lb 8 oz (87.8 kg)  11/14/20 198 lb 4.8 oz (89.9 kg)  ECOG FS: 2 GENERAL: Patient is a well appearing female in no acute distress examined in wheelchair HEENT:  Sclerae anicteric.  Mask in place. Neck is supple.  NODES:  No cervical, supraclavicular, or axillary lymphadenopathy palpated.  BREAST EXAM:  Deferred. LUNGS:  Clear to auscultation bilaterally.  No wheezes or rhonchi. HEART:  Regular rate and rhythm. No murmur appreciated. ABDOMEN:  Soft, nontender.  Positive, normoactive bowel  sounds. No organomegaly palpated. MSK:  No focal spinal tenderness to palpation.  EXTREMITIES:  No peripheral edema.   SKIN:  Clear with no obvious rashes or skin changes. No nail dyscrasia. NEURO:  Nonfocal. Well oriented.  Appropriate affect.   LAB RESULTS:  CMP     Component Value Date/Time   NA 144 02/03/2021 1222   K 3.2 (L) 02/03/2021 1222   CL 105 02/03/2021 1222   CO2 29 02/03/2021 1222   GLUCOSE 88 02/03/2021 1222   BUN 17 02/03/2021 1222   CREATININE 1.03 (H) 02/03/2021 1222   CREATININE 0.91 11/14/2020 1254   CALCIUM 9.5 02/03/2021 1222   PROT 6.5 02/03/2021 1222   ALBUMIN 3.4 (L) 02/03/2021 1222   AST 18 02/03/2021 1222   AST 20 11/14/2020 1254   ALT 10 02/03/2021 1222   ALT 14 11/14/2020 1254   ALKPHOS 108 02/03/2021 1222   BILITOT 0.4 02/03/2021 1222   BILITOT 0.2 (L) 11/14/2020 1254   GFRNONAA >60 02/03/2021 1222   GFRNONAA >60 11/14/2020 1254   GFRAA >60 02/24/2020 1454   GFRAA >60 07/24/2019 1453    No results found for: TOTALPROTELP, ALBUMINELP, A1GS, A2GS, BETS, BETA2SER, GAMS, MSPIKE, SPEI  No results found for: KPAFRELGTCHN, LAMBDASER, KAPLAMBRATIO  Lab Results  Component Value Date   WBC 3.3 (L) 02/03/2021   NEUTROABS PENDING 02/03/2021   HGB 9.6 (L) 02/03/2021   HCT 29.6 (L) 02/03/2021   MCV 115.2 (H) 02/03/2021   PLT 311 02/03/2021   No results found for: LABCA2  No components found for: IEPPIR518  No results for input(s): INR in the last 168 hours.  No results found for: LABCA2  No results found for: ACZ660  No results found for: CAN125  No results found for: YTK160  Lab Results  Component Value Date   CA2729 57.9 (H) 12/12/2020    No components found for: HGQUANT  No results found for: CEA1 / No results found for: CEA1   No results found for: AFPTUMOR  No results found for: CHROMOGRNA  No results found for: HGBA, HGBA2QUANT, HGBFQUANT, HGBSQUAN (Hemoglobinopathy evaluation)   Lab Results  Component Value Date    LDH 215 (H) 07/07/2020    Lab Results  Component Value Date   IRON 40 (L) 07/07/2020   TIBC 448 (H) 07/07/2020   IRONPCTSAT 9 (L) 07/07/2020   (Iron and TIBC)  Lab Results  Component Value Date   FERRITIN 21 07/07/2020    Urinalysis    Component Value Date/Time   COLORURINE YELLOW 04/07/2020 1216   APPEARANCEUR CLEAR 04/07/2020 1216   LABSPEC 1.026 04/07/2020 1216   PHURINE 5.0 04/07/2020 1216   GLUCOSEU NEGATIVE 04/07/2020 1216   HGBUR NEGATIVE 04/07/2020 1216   BILIRUBINUR NEGATIVE 04/07/2020 1216   KETONESUR NEGATIVE 04/07/2020 1216   PROTEINUR NEGATIVE 04/07/2020 1216   NITRITE NEGATIVE 04/07/2020 1216   LEUKOCYTESUR SMALL (A) 04/07/2020 1216    STUDIES:  CT Chest W Contrast  Result Date: 01/17/2021 CLINICAL DATA:  60 year old female with history of right-sided breast cancer with metastatic disease. Evaluate treatment response. EXAM: CT CHEST WITH CONTRAST TECHNIQUE: Multidetector CT imaging of the chest was performed during intravenous contrast administration. CONTRAST:  71m OMNIPAQUE IOHEXOL 350 MG/ML SOLN COMPARISON:  Chest CT 05/14/2020. FINDINGS: Cardiovascular: Heart size is normal. There is no significant pericardial fluid, thickening or pericardial calcification. There is aortic atherosclerosis, as well as atherosclerosis of the great vessels of the mediastinum and the coronary arteries, including calcified atherosclerotic plaque in the left anterior descending, left circumflex and right coronary arteries. Mediastinum/Nodes: No pathologically enlarged mediastinal, internal mammary or hilar lymph nodes. Large hiatal hernia. No axillary lymphadenopathy (a cluster of small nonenlarged lymph nodes is noted). Lungs/Pleura: Severe chronic elevation of the  right hemidiaphragm again noted. This is associated with areas of passive subsegmental atelectasis and/or scarring in the right lung. No acute consolidative airspace disease. No pleural effusions. No suspicious appearing  pulmonary nodules or masses are noted. Upper Abdomen: Partially calcified gallstones in the gallbladder. Aortic atherosclerosis. Musculoskeletal: Diffuse lytic and sclerotic lesions noted throughout the visualized axial and appendicular skeleton, compatible with widespread metastatic disease to the bones, similar to the prior study. Chronic compression fractures at T11 and T12 with acute kyphotic deformity at this level, similar to the prior examination. Old healed fracture of the medial right clavicle with chronic posttraumatic deformity. Orthopedic fixation hardware in the proximal left humerus, incompletely imaged. IMPRESSION: 1. Widespread metastatic disease to the bones is very similar to the prior examination. 2. No other evidence of metastatic disease to the thorax. 3. Previously noted left axillary lymphadenopathy appears resolved. 4. Aortic atherosclerosis, in addition to 3 vessel coronary artery disease. Please note that although the presence of coronary artery calcium documents the presence of coronary artery disease, the severity of this disease and any potential stenosis cannot be assessed on this non-gated CT examination. Assessment for potential risk factor modification, dietary therapy or pharmacologic therapy may be warranted, if clinically indicated. 5. Cholelithiasis. Aortic Atherosclerosis (ICD10-I70.0). Electronically Signed   By: Vinnie Langton M.D.   On: 01/17/2021 09:02     ELIGIBLE FOR AVAILABLE RESEARCH PROTOCOL: no   ASSESSMENT: 60 y.o. [place], [place] woman presenting 10/26/2018 with right-sided inflammatory breast cancer, stage IV, involvnh lungs, lymph nodes and bones, as follows:  (a) chest CT scan 10/26/2018 shows bilateral pleural effusions, possible lymphangitic spread of tumor, diffuse bony metastatic disease, and significant axillary mediastinal and hilar adenopathy  (b) bone scan 10/27/2018 is a "near [place]" consistent with widespread bony metastatic  disease  (c) head CT with and without contrast 10/30/2018 shows no intracranial metastatic disease, multiple calvarial lesions  (d) CA-27-29 on 10/27/2018 was 1810.4  (1) pleural fluid from right thoracentesis 10/28/2018 confirms malignant cells consistent with a breast primary, strongly estrogen and progesterone receptor positive, HER-2 not amplified, with an MIB-1 of 2%  (2) anastrozole started 10/29/2018  (a) palbociclib started 11/13/2018 at 100 mg dose  (b) denosumab/xgeva started 11/13/2018   (C) palbociclib dose started  (3) associated problems:  (a) hypoxia secondary to effusions  (b) pain from bone lesions and left humeral and vertebral compression fractures  (c) right upper extremity lymphedema  (d) poor venous access  (4) genetics testing on 12/25/2018 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 STK11, TP53, TSC1, TSC2, and VHL.  The following genes were evaluated for sequence changes only: SDHA and HOXB13 c.251G>A variant only. The [facility] Melanoma Panel analyzed the following 9 genes: BAP1 BRCA2 CDK4 CDKN2A MITF POT1 PTEN RB1 Tp53.   (5) palliative radiation to the left humerus 07/20/2019 - 07/31/2019 Site Technique Total Dose (Gy) Dose per Fx (Gy) Completed Fx Beam Energies  Humerus, Left: Ext_Lt_humerus Complex 30/30 3 10/10 6X   (6) anemia requiring transfusion:   (a) B-12 level 134 FEB 2022: B-12 injectons started    PLAN:  Suzanne [name] continues to do well today.  She  has no clinical or radiographic sign of progression of her metastatic breast cancer.  She will continue on her current therapy with Anastrozole, Palbocilicb, Xgeva.  She is tolerating this well.  She has a mild hypokalemia and will increase her potassium intake.  She also notes some mild dizziness after standing that occurs on occasion.  Suzanne [name], her daughter will get her blood pressure, HR and oxygen status the next time this happens and send it to me through my chart.    Nijah and I reviewed that she should receive the next covid booster when it is available.  She will also continue with her b12 injections.  We will see Suzanne [name] back in 4 weeks for labs, f/u with Dr. Jana Hakim, and her next injection.  She knows to call for any questions that may arise between now and her next appointment.  We are happy to see her sooner if needed.   Total encounter time 20 minutes.Wilber Bihari, NP 02/03/21 1:11 PM Medical Oncology and Hematology [facility] [place], [place] [address] Tel. [phone]    Fax. [phone]   *Total Encounter Time as defined by the Centers for Medicare and Medicaid Services includes, in addition to the face-to-face time of a patient visit (documented in the note above) non-face-to-face time: obtaining and reviewing outside history, ordering and reviewing medications, tests or procedures, care coordination (communications with other health care professionals or caregivers) and documentation in the medical record.

## 2021-02-03 NOTE — Patient Instructions (Signed)
Denosumab injection What is this medication? DENOSUMAB (den oh sue mab) slows bone breakdown. Prolia is used to treat osteoporosis in women after menopause and in men, and in people who are taking corticosteroids for 6 months or more. Xgeva is used to treat a high calcium level due to cancer and to prevent bone fractures and other bone problems caused by multiple myeloma or cancer bone metastases. Xgeva is also used to treat giant cell tumor of the bone. This medicine may be used for other purposes; ask your health care provider or pharmacist if you have questions. COMMON BRAND NAME(S): Prolia, XGEVA What should I tell my care team before I take this medication? They need to know if you have any of these conditions: dental disease having surgery or tooth extraction infection kidney disease low levels of calcium or Vitamin D in the blood malnutrition on hemodialysis skin conditions or sensitivity thyroid or parathyroid disease an unusual reaction to denosumab, other medicines, foods, dyes, or preservatives pregnant or trying to get pregnant breast-feeding How should I use this medication? This medicine is for injection under the skin. It is given by a health care professional in a hospital or clinic setting. A special MedGuide will be given to you before each treatment. Be sure to read this information carefully each time. For Prolia, talk to your pediatrician regarding the use of this medicine in children. Special care may be needed. For Xgeva, talk to your pediatrician regarding the use of this medicine in children. While this drug may be prescribed for children as young as 13 years for selected conditions, precautions do apply. Overdosage: If you think you have taken too much of this medicine contact a poison control center or emergency room at once. NOTE: This medicine is only for you. Do not share this medicine with others. What if I miss a dose? It is important not to miss your dose.  Call your doctor or health care professional if you are unable to keep an appointment. What may interact with this medication? Do not take this medicine with any of the following medications: other medicines containing denosumab This medicine may also interact with the following medications: medicines that lower your chance of fighting infection steroid medicines like prednisone or cortisone This list may not describe all possible interactions. Give your health care provider a list of all the medicines, herbs, non-prescription drugs, or dietary supplements you use. Also tell them if you smoke, drink alcohol, or use illegal drugs. Some items may interact with your medicine. What should I watch for while using this medication? Visit your doctor or health care professional for regular checks on your progress. Your doctor or health care professional may order blood tests and other tests to see how you are doing. Call your doctor or health care professional for advice if you get a fever, chills or sore throat, or other symptoms of a cold or flu. Do not treat yourself. This drug may decrease your body's ability to fight infection. Try to avoid being around people who are sick. You should make sure you get enough calcium and vitamin D while you are taking this medicine, unless your doctor tells you not to. Discuss the foods you eat and the vitamins you take with your health care professional. See your dentist regularly. Brush and floss your teeth as directed. Before you have any dental work done, tell your dentist you are receiving this medicine. Do not become pregnant while taking this medicine or for 5 months after   stopping it. Talk with your doctor or health care professional about your birth control options while taking this medicine. Women should inform their doctor if they wish to become pregnant or think they might be pregnant. There is a potential for serious side effects to an unborn child. Talk to  your health care professional or pharmacist for more information. What side effects may I notice from receiving this medication? Side effects that you should report to your doctor or health care professional as soon as possible: allergic reactions like skin rash, itching or hives, swelling of the face, lips, or tongue bone pain breathing problems dizziness jaw pain, especially after dental work redness, blistering, peeling of the skin signs and symptoms of infection like fever or chills; cough; sore throat; pain or trouble passing urine signs of low calcium like fast heartbeat, muscle cramps or muscle pain; pain, tingling, numbness in the hands or feet; seizures unusual bleeding or bruising unusually weak or tired Side effects that usually do not require medical attention (report to your doctor or health care professional if they continue or are bothersome): constipation diarrhea headache joint pain loss of appetite muscle pain runny nose tiredness upset stomach This list may not describe all possible side effects. Call your doctor for medical advice about side effects. You may report side effects to FDA at 1-800-FDA-1088. Where should I keep my medication? This medicine is only given in a clinic, doctor's office, or other health care setting and will not be stored at home. NOTE: This sheet is a summary. It may not cover all possible information. If you have questions about this medicine, talk to your doctor, pharmacist, or health care provider. Vitamin B12 Deficiency Vitamin B12 deficiency occurs when the body does not have enough vitamin B12, which is an important vitamin. The body needs this vitamin: To make red blood cells. To make DNA. This is the genetic material inside cells. To help the nerves work properly so they can carry messages from the brain to the body. Vitamin B12 deficiency can cause various health problems, such as a low red blood cell count (anemia) or nerve  damage. What are the causes? This condition may be caused by: Not eating enough foods that contain vitamin B12. Not having enough stomach acid and digestive fluids to properly absorb vitamin B12 from the food that you eat. Certain digestive system diseases that make it hard to absorb vitamin B12. These diseases include Crohn's disease, chronic pancreatitis, and cystic fibrosis. A condition in which the body does not make enough of a protein (intrinsic factor), resulting in too few red blood cells (pernicious anemia). Having a surgery in which part of the stomach or small intestine is removed. Taking certain medicines that make it hard for the body to absorb vitamin B12. These medicines include: Heartburn medicines (antacids and proton pump inhibitors). Certain antibiotic medicines. Some medicines that are used to treat diabetes, tuberculosis, gout, or high cholesterol. What increases the risk? The following factors may make you more likely to develop a B12 deficiency: Being older than age 69. Eating a vegetarian or vegan diet, especially while you are pregnant. Eating a poor diet while you are pregnant. Taking certain medicines. Having alcoholism. What are the signs or symptoms? In some cases, there are no symptoms of this condition. If the condition leads to anemia or nerve damage, various symptoms can occur, such as: Weakness. Fatigue. Loss of appetite. Weight loss. Numbness or tingling in your hands and feet. Redness and burning of the  tongue. Confusion or memory problems. Depression. Sensory problems, such as color blindness, ringing in the ears, or loss of taste. Diarrhea or constipation. Trouble walking. If anemia is severe, symptoms can include: Shortness of breath. Dizziness. Rapid heart rate (tachycardia). How is this diagnosed? This condition may be diagnosed with a blood test to measure the level of vitamin B12 in your blood. You may also have other tests,  including: A group of tests that measure certain characteristics of blood cells (complete blood count, CBC). A blood test to measure intrinsic factor. A procedure where a thin tube with a camera on the end is used to look into your stomach or intestines (endoscopy). Other tests may be needed to discover the cause of B12 deficiency. How is this treated? Treatment for this condition depends on the cause. This condition may be treated by: Changing your eating and drinking habits, such as: Eating more foods that contain vitamin B12. Drinking less alcohol or no alcohol. Getting vitamin B12 injections. Taking vitamin B12 supplements. Your health care provider will tell you which dosage is best for you. Follow these instructions at home: Eating and drinking  Eat lots of healthy foods that contain vitamin B12, including: Meats and poultry. This includes beef, pork, chicken, [place], and organ meats, such as liver. Seafood. This includes clams, rainbow trout, salmon, tuna, and haddock. Eggs. Cereal and dairy products that are fortified. This means that vitamin B12 has been added to the food. Check the label on the package to see if the food is fortified. The items listed above may not be a complete list of recommended foods and beverages. Contact a dietitian for more information. General instructions Get any injections that are prescribed by your health care provider. Take supplements only as told by your health care provider. Follow the directions carefully. Do not drink alcohol if your health care provider tells you not to. In some cases, you may only be asked to limit alcohol use. Keep all follow-up visits as told by your health care provider. This is important. Contact a health care provider if: Your symptoms come back. Get help right away if you: Develop shortness of breath. Have a rapid heart rate. Have chest pain. Become dizzy or lose consciousness. Summary Vitamin B12 deficiency  occurs when the body does not have enough vitamin B12. The main causes of vitamin B12 deficiency include dietary deficiency, digestive diseases, pernicious anemia, and having a surgery in which part of the stomach or small intestine is removed. In some cases, there are no symptoms of this condition. If the condition leads to anemia or nerve damage, various symptoms can occur, such as weakness, shortness of breath, and numbness. Treatment may include getting vitamin B12 injections or taking vitamin B12 supplements. Eat lots of healthy foods that contain vitamin B12. This information is not intended to replace advice given to you by your health care provider. Make sure you discuss any questions you have with your health care provider. Document Revised: 10/31/2018 Document Reviewed: 01/21/2018 Elsevier Patient Education  2022 [place].   2022 Elsevier/Gold Standard (2017-09-20 16:10:44)

## 2021-02-04 LAB — CANCER ANTIGEN 27.29: CA 27.29: 68.9 U/mL — ABNORMAL HIGH (ref 0.0–38.6)

## 2021-02-06 ENCOUNTER — Other Ambulatory Visit: Payer: Medicaid Other

## 2021-02-06 ENCOUNTER — Other Ambulatory Visit: Payer: Self-pay

## 2021-02-06 ENCOUNTER — Ambulatory Visit: Payer: Medicaid Other

## 2021-02-06 DIAGNOSIS — C50811 Malignant neoplasm of overlapping sites of right female breast: Secondary | ICD-10-CM | POA: Diagnosis not present

## 2021-02-06 NOTE — Patient Outreach (Signed)
Medicaid Managed Care Social Work Note  02/06/2021 Name:  Suzanne [name] MRN:  [id number] DOB:  06-30-1960  Suzanne [name] is an 60 y.o. year old female who is a primary patient of [name], NP.  The [facility] Managed Care Coordination team was consulted for assistance with:  Transportation Needs   Suzanne [name] was given information about Medicaid Managed Care Coordination team services today. [name] Patient agreed to services and verbal consent obtained.  Engaged with patient  for by telephone forinitial visit in response to referral for case management and/or care coordination services.   Assessments/Interventions:  Review of past medical history, allergies, medications, health status, including review of consultants reports, laboratory and other test data, was performed as part of comprehensive evaluation and provision of chronic care management services.  SDOH: (Social Determinant of Health) assessments and interventions performed:  BSW contacted patient regarding transportation for running errands. Patient stated she is already signed up with [facility] and uses them for her medical appointments. BSW provided patient with the telephone number for [facility] [phone]. Patient stated she would give them a call. No other resources are needed at this time.   Advanced Directives Status:  Not addressed in this encounter.  Care Plan                 No Known Allergies  Medications Reviewed Today     Reviewed by Gardenia Phlegm, NP (Nurse Practitioner) on 02/03/21 at 82  Med List Status: <None>   Medication Order Taking? Sig Documenting Provider Last Dose Status Informant  anastrozole (ARIMIDEX) 1 MG tablet [id number] No Take 1 tablet (1 mg total) by mouth daily. [name], Virgie Dad, MD Taking Active   antiseptic oral rinse Charlene Brooke) LIQD [id number] No 15 mLs by Mouth Rinse route as needed for dry mouth.  Patient not taking: No sig reported   [provider] Not Taking Active   Blood Pressure Monitor MISC [id number] No Medium sized cuff. Use to check blood pressure twice per day and more often if not feeling well. I10.0 [name], NP Taking Active   calcium-vitamin D (OSCAL WITH D) 500-200 MG-UNIT tablet [id number] No Take 1 tablet by mouth 3 (three) times daily. [name], Virgie Dad, MD Taking Active   cetirizine (ZYRTEC) 10 MG tablet [id number] No Take 1 tablet (10 mg total) by mouth daily. [name], NP Taking Expired 10/17/20 2359   cyanocobalamin ([employer] VITAMIN B12) 2000 MCG tablet [id number] No Take 1 tablet (2,000 mcg total) by mouth daily. [name], Virgie Dad, MD Taking Active   famotidine (PEPCID) 20 MG tablet [id number] No Take 1 tablet (20 mg total) by mouth 2 (two) times daily as needed for heartburn or indigestion. [name], Virgie Dad, MD Taking Active   ferrous sulfate 325 (65 FE) MG EC tablet [id number] No TAKE ONE TABLET BY MOUTH THREE TIMES DAILY with meals [name], Virgie Dad, MD Taking Active   folic acid (FOLVITE) 1 MG tablet [id number] No Take 1 tablet (1 mg total) by mouth daily. [name], Virgie Dad, MD Taking Active   furosemide (LASIX) 20 MG tablet [id number] No Take 1 tablet (20 mg total) by mouth daily. [name], Virgie Dad, MD Taking Active   ketoconazole (NIZORAL) 2 % cream [id number] No APPLY TO THE AFFECTED AREA(S) DAILY AS NEEDED [name], Virgie Dad, MD Taking Active   methocarbamol (ROBAXIN) 500 MG tablet [id number] No Take 500 mg by mouth every 6 (six) hours as needed.  Patient not taking:  No sig reported   [provider] Not Taking Active   nystatin (MYCOSTATIN/NYSTOP) powder [id number] No Apply 1 application topically as needed. Gardenia Phlegm, NP Taking Active   palbociclib [facility]) 75 MG tablet [id number]  TAKE 1 TABLET (75 MG TOTAL) BY MOUTH DAILY. TAKE FOR 21 DAYS ON, 7 DAYS OFF, REPEAT EVERY 28 DAYS [name], Virgie Dad, MD  Active   polyethylene glycol (MIRALAX / GLYCOLAX) 17 g packet  [id number] No Take 17 g by mouth as needed. [name], Virgie Dad, MD Taking Active   sertraline (ZOLOFT) 50 MG tablet [id number]  TAKE 1 AND 1/2 TABLETS BY MOUTH EVERYDAY AT BEDTIME [name], NP  Active   ZOFRAN 8 MG tablet [id number] No Take 1 tablet (8 mg total) by mouth every 8 (eight) hours. Gardenia Phlegm, NP Taking Active   Med List Note [name], RPH-CPP 11/13/19 [id number]): [name] filled at [facility]            Patient Active Problem List   Diagnosis Date Noted   B12 deficiency anemia 08/15/2020   Intractable back pain 02/03/2019   Thoracic compression fracture, closed, initial encounter ([place]) 02/03/2019   Essential hypertension 02/03/2019   Genetic testing 01/27/2019   Acute pharyngitis    Bacteremia    FTT (failure to thrive) in adult    Fracture closed, humerus, shaft 01/09/2019   Pneumonia 12/25/2018   Acute respiratory disorder in immunocompromised patient ([place]) 12/25/2018   Family history of breast cancer    Family history of prostate cancer    Family history of melanoma    Family history of colon cancer    Pressure injury of skin 11/24/2018   Chronic respiratory failure with hypoxia and hypercapnia ([place]) 11/19/2018   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9, adult ([place]) 10/31/2018   Bone injury    Pleural effusion, bilateral    Shortness of breath     Abnormal breast finding    Hypokalemia    Breast skin changes    Macrocytic anemia    Bone metastasis (HCC)    Anemia, B12 deficiency 10/26/2018    Conditions to be addressed/monitored per PCP order:   transportation for running errands  Care Plan : RN Care Manager Plan of Care  Updates made by Ethelda Chick since 02/06/2021 12:00 AM     Problem: Health Management Coordination Needs      Long-Range Goal: Development of Plan of Care for needs related to Health Management   Start Date: 01/26/2021  Expected End Date: 05/26/2021  Priority: High  Note:   Current Barriers:  Chronic Disease Management support and education needs related to Health Maintenance-Suzanne [name] is working on applying for food benefits, but would like to decrease her monthly bills to help with affording groceries. She and her daughter live together, neither drive and have to pay for transportation to get groceries-which adds to her grocery bill. Suzanne [name] would like to schedule  a mammogram, but lost the number for scheduling given to her by Dr. Jana Hakim. She is working with Ovid Curd for medication management.  RNCM Clinical Goal(s):  Patient will verbalize understanding of plan for management of Health Maintenance attend all scheduled medical appointments: Oncology 9/9 continue to work with RN Care Manager to address care management and care coordination needs related to Health Maintenance  through collaboration with RN Care manager, provider, and care team.   Interventions: Inter-disciplinary care team collaboration (see longitudinal plan of care) Evaluation of current treatment plan related to  self management and patient's adherence to plan as established by provider 02/06/21: BSW contacted patient regarding transportation for running errands. Patient stated she is already signed up with [facility] and uses them for her medical appointments. BSW provided patient with the telephone number for [facility]  [phone]. Patient stated she would give them a call. No other resources are needed at this time.   Health Maintenance  (Status: New goal.) Evaluation of current treatment plan related to  Health Maintenance ,  self-management and patient's adherence to plan as established by provider. Discussed plans with patient for ongoing care management follow up and provided patient with direct contact information for care management team Advised patient to contact [place] for benefits, such as free phone and rewards; Reviewed scheduled/upcoming provider appointments including Oncology 9/9; Social Work referral for assistance with transportation for errands such as grocery shopping; Discussed plans with patient for ongoing care management follow up and provided patient with direct contact information for care management team; Provided patient with contact information to [facility] [phone]   Patient Goals/Self-Care Activities: Patient will self administer medications as prescribed Patient will attend all scheduled provider appointments Patient will call pharmacy for medication refills Patient will call provider office for new concerns or questions Patient will work with BSW to address care coordination needs and will continue to work with the clinical team to address health care and disease management related needs.         Follow up:  Patient agrees to Care Plan and Follow-up.  Plan: The Managed Medicaid care management team will reach out to the patient again over the next 30 days.  Date/time of next scheduled Social Work care management/care coordination outreach:  03/08/21  Mickel Fuchs, Arita Miss, [place] Managed Medicaid Team  [phone]

## 2021-02-06 NOTE — Patient Instructions (Signed)
Visit Information  Suzanne [name] was given information about Medicaid Managed Care team care coordination services as a part of their [place] Medicaid benefit. Suzanne [name] verbally consented to engagement with the [facility] Managed Care team.   If you are experiencing a medical emergency, please call 911 or report to your local emergency department or urgent care.   If you have a non-emergency medical problem during routine business hours, please contact your provider's office and ask to speak with a nurse.   For questions related to your [facility], please call: [phone] or visit the homepage here: [email or web address]  If you would like to schedule transportation through your [facility], please call the following number at least 2 days in advance of your appointment: [phone].   Call the [place] at [phone], at any time, 24 hours a day, 7 days a week. If you are in danger or need immediate medical attention call 911.  If you would like help to quit smoking, call 1-800-QUIT-NOW 870-243-4217) OR Espaol: 1-855-Djelo-Ya HD:1601594) o para ms informacin haga clic aqu or Text READY to 200-400 to register via text  Suzanne [name] - following are the goals we discussed in your visit today:   Goals Addressed   None     Social Worker will follow up in 30 days.  Suzanne [name], BSW, [place] Managed Medicaid Team  [phone]   Following is a copy of your plan of care:

## 2021-02-07 DIAGNOSIS — C50811 Malignant neoplasm of overlapping sites of right female breast: Secondary | ICD-10-CM | POA: Diagnosis not present

## 2021-02-08 ENCOUNTER — Other Ambulatory Visit: Payer: Medicaid Other

## 2021-02-08 DIAGNOSIS — C50811 Malignant neoplasm of overlapping sites of right female breast: Secondary | ICD-10-CM | POA: Diagnosis not present

## 2021-02-09 DIAGNOSIS — C50811 Malignant neoplasm of overlapping sites of right female breast: Secondary | ICD-10-CM | POA: Diagnosis not present

## 2021-02-10 DIAGNOSIS — C50811 Malignant neoplasm of overlapping sites of right female breast: Secondary | ICD-10-CM | POA: Diagnosis not present

## 2021-02-13 DIAGNOSIS — C50811 Malignant neoplasm of overlapping sites of right female breast: Secondary | ICD-10-CM | POA: Diagnosis not present

## 2021-02-14 DIAGNOSIS — C50811 Malignant neoplasm of overlapping sites of right female breast: Secondary | ICD-10-CM | POA: Diagnosis not present

## 2021-02-15 ENCOUNTER — Other Ambulatory Visit (HOSPITAL_COMMUNITY): Payer: Self-pay

## 2021-02-15 DIAGNOSIS — H43812 Vitreous degeneration, left eye: Secondary | ICD-10-CM | POA: Diagnosis not present

## 2021-02-15 DIAGNOSIS — H0102B Squamous blepharitis left eye, upper and lower eyelids: Secondary | ICD-10-CM | POA: Diagnosis not present

## 2021-02-15 DIAGNOSIS — H0102A Squamous blepharitis right eye, upper and lower eyelids: Secondary | ICD-10-CM | POA: Diagnosis not present

## 2021-02-15 DIAGNOSIS — H40013 Open angle with borderline findings, low risk, bilateral: Secondary | ICD-10-CM | POA: Diagnosis not present

## 2021-02-15 DIAGNOSIS — Z961 Presence of intraocular lens: Secondary | ICD-10-CM | POA: Diagnosis not present

## 2021-02-16 ENCOUNTER — Other Ambulatory Visit (HOSPITAL_COMMUNITY): Payer: Self-pay

## 2021-02-16 DIAGNOSIS — C50811 Malignant neoplasm of overlapping sites of right female breast: Secondary | ICD-10-CM | POA: Diagnosis not present

## 2021-02-18 ENCOUNTER — Other Ambulatory Visit: Payer: Medicaid Other

## 2021-02-20 ENCOUNTER — Other Ambulatory Visit (HOSPITAL_COMMUNITY): Payer: Self-pay

## 2021-02-20 DIAGNOSIS — C50811 Malignant neoplasm of overlapping sites of right female breast: Secondary | ICD-10-CM | POA: Diagnosis not present

## 2021-02-21 DIAGNOSIS — C50811 Malignant neoplasm of overlapping sites of right female breast: Secondary | ICD-10-CM | POA: Diagnosis not present

## 2021-02-23 DIAGNOSIS — C50811 Malignant neoplasm of overlapping sites of right female breast: Secondary | ICD-10-CM | POA: Diagnosis not present

## 2021-02-24 DIAGNOSIS — C50811 Malignant neoplasm of overlapping sites of right female breast: Secondary | ICD-10-CM | POA: Diagnosis not present

## 2021-02-25 DIAGNOSIS — C7801 Secondary malignant neoplasm of right lung: Secondary | ICD-10-CM | POA: Diagnosis not present

## 2021-02-25 DIAGNOSIS — S22000A Wedge compression fracture of unspecified thoracic vertebra, initial encounter for closed fracture: Secondary | ICD-10-CM | POA: Diagnosis not present

## 2021-02-25 DIAGNOSIS — J9621 Acute and chronic respiratory failure with hypoxia: Secondary | ICD-10-CM | POA: Diagnosis not present

## 2021-02-25 DIAGNOSIS — R0602 Shortness of breath: Secondary | ICD-10-CM | POA: Diagnosis not present

## 2021-02-27 DIAGNOSIS — C50811 Malignant neoplasm of overlapping sites of right female breast: Secondary | ICD-10-CM | POA: Diagnosis not present

## 2021-02-28 DIAGNOSIS — C50811 Malignant neoplasm of overlapping sites of right female breast: Secondary | ICD-10-CM | POA: Diagnosis not present

## 2021-03-01 DIAGNOSIS — C50811 Malignant neoplasm of overlapping sites of right female breast: Secondary | ICD-10-CM | POA: Diagnosis not present

## 2021-03-02 DIAGNOSIS — C50811 Malignant neoplasm of overlapping sites of right female breast: Secondary | ICD-10-CM | POA: Diagnosis not present

## 2021-03-03 DIAGNOSIS — C50811 Malignant neoplasm of overlapping sites of right female breast: Secondary | ICD-10-CM | POA: Diagnosis not present

## 2021-03-06 ENCOUNTER — Inpatient Hospital Stay: Payer: Medicaid Other

## 2021-03-06 ENCOUNTER — Other Ambulatory Visit: Payer: Self-pay

## 2021-03-06 ENCOUNTER — Encounter: Payer: Self-pay | Admitting: Adult Health

## 2021-03-06 ENCOUNTER — Inpatient Hospital Stay (HOSPITAL_BASED_OUTPATIENT_CLINIC_OR_DEPARTMENT_OTHER): Payer: Medicaid Other | Admitting: Adult Health

## 2021-03-06 ENCOUNTER — Ambulatory Visit: Payer: Medicaid Other | Admitting: Oncology

## 2021-03-06 ENCOUNTER — Inpatient Hospital Stay: Payer: Medicaid Other | Attending: Adult Health

## 2021-03-06 VITALS — BP 100/31 | HR 84 | Temp 97.7°F | Resp 18 | Ht 59.0 in | Wt 194.2 lb

## 2021-03-06 DIAGNOSIS — C7951 Secondary malignant neoplasm of bone: Secondary | ICD-10-CM

## 2021-03-06 DIAGNOSIS — Z170 Estrogen receptor positive status [ER+]: Secondary | ICD-10-CM

## 2021-03-06 DIAGNOSIS — R0602 Shortness of breath: Secondary | ICD-10-CM

## 2021-03-06 DIAGNOSIS — C50811 Malignant neoplasm of overlapping sites of right female breast: Secondary | ICD-10-CM

## 2021-03-06 DIAGNOSIS — D649 Anemia, unspecified: Secondary | ICD-10-CM

## 2021-03-06 DIAGNOSIS — C7801 Secondary malignant neoplasm of right lung: Secondary | ICD-10-CM

## 2021-03-06 DIAGNOSIS — C7802 Secondary malignant neoplasm of left lung: Secondary | ICD-10-CM

## 2021-03-06 DIAGNOSIS — J90 Pleural effusion, not elsewhere classified: Secondary | ICD-10-CM

## 2021-03-06 DIAGNOSIS — C50911 Malignant neoplasm of unspecified site of right female breast: Secondary | ICD-10-CM

## 2021-03-06 DIAGNOSIS — E538 Deficiency of other specified B group vitamins: Secondary | ICD-10-CM | POA: Diagnosis present

## 2021-03-06 DIAGNOSIS — Z23 Encounter for immunization: Secondary | ICD-10-CM | POA: Diagnosis not present

## 2021-03-06 DIAGNOSIS — G893 Neoplasm related pain (acute) (chronic): Secondary | ICD-10-CM

## 2021-03-06 LAB — CBC WITH DIFFERENTIAL/PLATELET
Abs Immature Granulocytes: 0.01 10*3/uL (ref 0.00–0.07)
Basophils Absolute: 0.1 10*3/uL (ref 0.0–0.1)
Basophils Relative: 3 %
Eosinophils Absolute: 0.1 10*3/uL (ref 0.0–0.5)
Eosinophils Relative: 2 %
HCT: 32.6 % — ABNORMAL LOW (ref 36.0–46.0)
Hemoglobin: 10.6 g/dL — ABNORMAL LOW (ref 12.0–15.0)
Immature Granulocytes: 0 %
Lymphocytes Relative: 19 %
Lymphs Abs: 0.5 10*3/uL — ABNORMAL LOW (ref 0.7–4.0)
MCH: 37.5 pg — ABNORMAL HIGH (ref 26.0–34.0)
MCHC: 32.5 g/dL (ref 30.0–36.0)
MCV: 115.2 fL — ABNORMAL HIGH (ref 80.0–100.0)
Monocytes Absolute: 0.2 10*3/uL (ref 0.1–1.0)
Monocytes Relative: 6 %
Neutro Abs: 1.9 10*3/uL (ref 1.7–7.7)
Neutrophils Relative %: 70 %
Platelets: 302 10*3/uL (ref 150–400)
RBC: 2.83 MIL/uL — ABNORMAL LOW (ref 3.87–5.11)
RDW: 14.2 % (ref 11.5–15.5)
WBC: 2.8 10*3/uL — ABNORMAL LOW (ref 4.0–10.5)
nRBC: 0 % (ref 0.0–0.2)

## 2021-03-06 LAB — COMPREHENSIVE METABOLIC PANEL
ALT: 9 U/L (ref 0–44)
AST: 18 U/L (ref 15–41)
Albumin: 3.6 g/dL (ref 3.5–5.0)
Alkaline Phosphatase: 117 U/L (ref 38–126)
Anion gap: 9 (ref 5–15)
BUN: 17 mg/dL (ref 8–23)
CO2: 28 mmol/L (ref 22–32)
Calcium: 9.4 mg/dL (ref 8.9–10.3)
Chloride: 105 mmol/L (ref 98–111)
Creatinine, Ser: 1.1 mg/dL — ABNORMAL HIGH (ref 0.44–1.00)
GFR, Estimated: 57 mL/min — ABNORMAL LOW (ref >60)
Glucose, Bld: 99 mg/dL (ref 70–99)
Potassium: 4.2 mmol/L (ref 3.5–5.1)
Sodium: 142 mmol/L (ref 135–145)
Total Bilirubin: 0.4 mg/dL (ref 0.3–1.2)
Total Protein: 7 g/dL (ref 6.5–8.1)

## 2021-03-06 MED ORDER — CYANOCOBALAMIN 1000 MCG/ML IJ SOLN
1000.0000 ug | Freq: Once | INTRAMUSCULAR | Status: AC
  Administered 2021-03-06: 1000 ug via INTRAMUSCULAR
  Filled 2021-03-06: qty 1

## 2021-03-06 MED ORDER — DENOSUMAB 120 MG/1.7ML ~~LOC~~ SOLN
120.0000 mg | Freq: Once | SUBCUTANEOUS | Status: AC
  Administered 2021-03-06: 120 mg via SUBCUTANEOUS
  Filled 2021-03-06: qty 1.7

## 2021-03-06 MED ORDER — INFLUENZA VAC SPLIT QUAD 0.5 ML IM SUSY
0.5000 mL | PREFILLED_SYRINGE | Freq: Once | INTRAMUSCULAR | Status: AC
  Administered 2021-03-06: 0.5 mL via INTRAMUSCULAR
  Filled 2021-03-06: qty 0.5

## 2021-03-06 NOTE — Patient Instructions (Addendum)
Denosumab injection What is this medication? DENOSUMAB (den oh sue mab) slows bone breakdown. Prolia is used to treat osteoporosis in women after menopause and in men, and in people who are taking corticosteroids for 6 months or more. Xgeva is used to treat a high calcium level due to cancer and to prevent bone fractures and other bone problems caused by multiple myeloma or cancer bone metastases. Xgeva is also used to treat giant cell tumor of the bone. This medicine may be used for other purposes; ask your health care provider or pharmacist if you have questions. COMMON BRAND NAME(S): Prolia, XGEVA What should I tell my care team before I take this medication? They need to know if you have any of these conditions: dental disease having surgery or tooth extraction infection kidney disease low levels of calcium or Vitamin D in the blood malnutrition on hemodialysis skin conditions or sensitivity thyroid or parathyroid disease an unusual reaction to denosumab, other medicines, foods, dyes, or preservatives pregnant or trying to get pregnant breast-feeding How should I use this medication? This medicine is for injection under the skin. It is given by a health care professional in a hospital or clinic setting. A special MedGuide will be given to you before each treatment. Be sure to read this information carefully each time. For Prolia, talk to your pediatrician regarding the use of this medicine in children. Special care may be needed. For Xgeva, talk to your pediatrician regarding the use of this medicine in children. While this drug may be prescribed for children as young as 13 years for selected conditions, precautions do apply. Overdosage: If you think you have taken too much of this medicine contact a poison control center or emergency room at once. NOTE: This medicine is only for you. Do not share this medicine with others. What if I miss a dose? It is important not to miss your dose.  Call your doctor or health care professional if you are unable to keep an appointment. What may interact with this medication? Do not take this medicine with any of the following medications: other medicines containing denosumab This medicine may also interact with the following medications: medicines that lower your chance of fighting infection steroid medicines like prednisone or cortisone This list may not describe all possible interactions. Give your health care provider a list of all the medicines, herbs, non-prescription drugs, or dietary supplements you use. Also tell them if you smoke, drink alcohol, or use illegal drugs. Some items may interact with your medicine. What should I watch for while using this medication? Visit your doctor or health care professional for regular checks on your progress. Your doctor or health care professional may order blood tests and other tests to see how you are doing. Call your doctor or health care professional for advice if you get a fever, chills or sore throat, or other symptoms of a cold or flu. Do not treat yourself. This drug may decrease your body's ability to fight infection. Try to avoid being around people who are sick. You should make sure you get enough calcium and vitamin D while you are taking this medicine, unless your doctor tells you not to. Discuss the foods you eat and the vitamins you take with your health care professional. See your dentist regularly. Brush and floss your teeth as directed. Before you have any dental work done, tell your dentist you are receiving this medicine. Do not become pregnant while taking this medicine or for 5 months after   stopping it. Talk with your doctor or health care professional about your birth control options while taking this medicine. Women should inform their doctor if they wish to become pregnant or think they might be pregnant. There is a potential for serious side effects to an unborn child. Talk to  your health care professional or pharmacist for more information. What side effects may I notice from receiving this medication? Side effects that you should report to your doctor or health care professional as soon as possible: allergic reactions like skin rash, itching or hives, swelling of the face, lips, or tongue bone pain breathing problems dizziness jaw pain, especially after dental work redness, blistering, peeling of the skin signs and symptoms of infection like fever or chills; cough; sore throat; pain or trouble passing urine signs of low calcium like fast heartbeat, muscle cramps or muscle pain; pain, tingling, numbness in the hands or feet; seizures unusual bleeding or bruising unusually weak or tired Side effects that usually do not require medical attention (report to your doctor or health care professional if they continue or are bothersome): constipation diarrhea headache joint pain loss of appetite muscle pain runny nose tiredness upset stomach This list may not describe all possible side effects. Call your doctor for medical advice about side effects. You may report side effects to FDA at 1-800-FDA-1088. Where should I keep my medication? This medicine is only given in a clinic, doctor's office, or other health care setting and will not be stored at home. NOTE: This sheet is a summary. It may not cover all possible information. If you have questions about this medicine, talk to your doctor, pharmacist, or health care provider.  Vitamin B12 Deficiency Vitamin B12 deficiency occurs when the body does not have enough vitamin B12, which is an important vitamin. The body needs this vitamin: To make red blood cells. To make DNA. This is the genetic material inside cells. To help the nerves work properly so they can carry messages from the brain to the body. Vitamin B12 deficiency can cause various health problems, such as a low red blood cell count (anemia) or nerve  damage. What are the causes? This condition may be caused by: Not eating enough foods that contain vitamin B12. Not having enough stomach acid and digestive fluids to properly absorb vitamin B12 from the food that you eat. Certain digestive system diseases that make it hard to absorb vitamin B12. These diseases include Crohn's disease, chronic pancreatitis, and cystic fibrosis. A condition in which the body does not make enough of a protein (intrinsic factor), resulting in too few red blood cells (pernicious anemia). Having a surgery in which part of the stomach or small intestine is removed. Taking certain medicines that make it hard for the body to absorb vitamin B12. These medicines include: Heartburn medicines (antacids and proton pump inhibitors). Certain antibiotic medicines. Some medicines that are used to treat diabetes, tuberculosis, gout, or high cholesterol. What increases the risk? The following factors may make you more likely to develop a B12 deficiency: Being older than age 42. Eating a vegetarian or vegan diet, especially while you are pregnant. Eating a poor diet while you are pregnant. Taking certain medicines. Having alcoholism. What are the signs or symptoms? In some cases, there are no symptoms of this condition. If the condition leads to anemia or nerve damage, various symptoms can occur, such as: Weakness. Fatigue. Loss of appetite. Weight loss. Numbness or tingling in your hands and feet. Redness and burning of  the tongue. Confusion or memory problems. Depression. Sensory problems, such as color blindness, ringing in the ears, or loss of taste. Diarrhea or constipation. Trouble walking. If anemia is severe, symptoms can include: Shortness of breath. Dizziness. Rapid heart rate (tachycardia). How is this diagnosed? This condition may be diagnosed with a blood test to measure the level of vitamin B12 in your blood. You may also have other tests,  including: A group of tests that measure certain characteristics of blood cells (complete blood count, CBC). A blood test to measure intrinsic factor. A procedure where a thin tube with a camera on the end is used to look into your stomach or intestines (endoscopy). Other tests may be needed to discover the cause of B12 deficiency. How is this treated? Treatment for this condition depends on the cause. This condition may be treated by: Changing your eating and drinking habits, such as: Eating more foods that contain vitamin B12. Drinking less alcohol or no alcohol. Getting vitamin B12 injections. Taking vitamin B12 supplements. Your health care provider will tell you which dosage is best for you. Follow these instructions at home: Eating and drinking  Eat lots of healthy foods that contain vitamin B12, including: Meats and poultry. This includes beef, pork, chicken, [place], and organ meats, such as liver. Seafood. This includes clams, rainbow trout, salmon, tuna, and haddock. Eggs. Cereal and dairy products that are fortified. This means that vitamin B12 has been added to the food. Check the label on the package to see if the food is fortified. The items listed above may not be a complete list of recommended foods and beverages. Contact a dietitian for more information. General instructions Get any injections that are prescribed by your health care provider. Take supplements only as told by your health care provider. Follow the directions carefully. Do not drink alcohol if your health care provider tells you not to. In some cases, you may only be asked to limit alcohol use. Keep all follow-up visits as told by your health care provider. This is important. Contact a health care provider if: Your symptoms come back. Get help right away if you: Develop shortness of breath. Have a rapid heart rate. Have chest pain. Become dizzy or lose consciousness. Summary Vitamin B12 deficiency  occurs when the body does not have enough vitamin B12. The main causes of vitamin B12 deficiency include dietary deficiency, digestive diseases, pernicious anemia, and having a surgery in which part of the stomach or small intestine is removed. In some cases, there are no symptoms of this condition. If the condition leads to anemia or nerve damage, various symptoms can occur, such as weakness, shortness of breath, and numbness. Treatment may include getting vitamin B12 injections or taking vitamin B12 supplements. Eat lots of healthy foods that contain vitamin B12. This information is not intended to replace advice given to you by your health care provider. Make sure you discuss any questions you have with your health care provider. Document Revised: 10/31/2018 Document Reviewed: 01/21/2018 Elsevier Patient Education  2022 [place].   2022 Elsevier/Gold Standard (2017-09-20 16:10:44)

## 2021-03-06 NOTE — Progress Notes (Signed)
Suzanne [name]  [phone] [phone]   ID: Suzanne [name] DOB: [birth year]  MR#: [id number]  GQQ#:[id number]  Patient Care Team: [name], NP as PCP - General (Nurse Practitioner) [name], Virgie Dad, MD as Consulting Physician (Oncology) Netta Cedars, MD as Consulting Physician (Orthopedic Surgery) Melissa Montane, RN as Case Manager Lane Hacker, [facility] as Pharmacist (Pharmacist) OTHER MD:   CHIEF COMPLAINT: Stage IV estrogen receptor positive breast cancer  CURRENT TREATMENT: Anastrozole; Palbociclib; Delton See   INTERVAL HISTORY: Suzanne [name] returns today for follow-up of her metastatic cancer.  She is accompanied by her daughter Suzanne [name] continues on anastrozole.  She is tolerating this well.    She is also on palbociclib at 75 mg daily 21 days on 7 days off.  She tolerates this with no side effects that she is aware of.  She notes she is in the middle of her second week.   She receives [place] every 4 weeks. She has no issues with receiving this, and denies any dental concerns.   She was started on B12 replacement 07/28/2020 and receives this every 4 weeks.  CT chest on 8/22 showed no disease progression.  She is due for mammogram and ultrasound on 03/09/2021.  At her last visit she was increasingly tired.  Today, she notes she is feeling better than she did af few weeks ago.  She is more energized and is taking less naps.    Lab Results  Component Value Date   CA2729 57.9 (H) 12/12/2020   CA2729 46.5 (H) 11/14/2020   CA2729 42.2 (H) 10/17/2020   CA2729 42.7 (H) 09/19/2020   CA2729 52.5 (H) 08/22/2020    REVIEW OF SYSTEMS: Review of Systems  Constitutional:  Positive for fatigue. Negative for appetite change, chills, fever and unexpected weight change.  HENT:   Negative for hearing loss, lump/mass and trouble swallowing.   Eyes:  Negative for eye problems and icterus.  Respiratory:  Negative for chest tightness, cough and  shortness of breath.   Cardiovascular:  Negative for chest pain, leg swelling and palpitations.  Gastrointestinal:  Negative for abdominal distention, abdominal pain, constipation, diarrhea, nausea and vomiting.  Endocrine: Negative for hot flashes.  Genitourinary:  Negative for difficulty urinating.   Musculoskeletal:  Negative for arthralgias.  Skin:  Negative for itching and rash.  Neurological:  Negative for dizziness, extremity weakness, headaches and numbness.  Hematological:  Negative for adenopathy. Does not bruise/bleed easily.  Psychiatric/Behavioral:  Negative for depression. The patient is not nervous/anxious.      COVID 19 VACCINATION STATUS: Status post Pfizer x2 with booster December 2021   HISTORY OF CURRENT ILLNESS: From the original inpatient consult note:  Suzanne [name] is a 60 year old female from [place], [place] without significant past medical history with exception of obesity, remote history of gallstones, and remote history of mastitis.  The patient presented to the hospital on 10/26/2018 with a 3-week history of worsening shortness of breath and lower extremity edema.  The patient reported that her shortness of breath worsened with exertion.  Her lower extremity edema did not improve with elevation.  She also had an intermittent pr oductive cough with rare sputum production.  The patient was noted to be anemic with a hemoglobin of 4.5 with a clumped platelets noted on admission.  Stool for occult blood was positive.  Patient received 2 units of packed red blood cells.    The patient reported a rash under her breast x1 week which has  been foul-smelling.  She also reported that she has some discomfort and thickening/hardness of her right breast for several months.  She states that her right breast is much larger than her left.  She notes some occasional bloody discharge from her breast once in a while.  A chest x-ray on admission showed diffuse bilateral interstitial  opacity of unknown chronicity, findings could be related to interstitial inflammatory process, atypical/viral pneumonia, or possible metastatic disease.  There are also sclerotic and lytic lesions within the clavicles, bilateral ribs, shoulders which was concerning for diffuse few skeletal metastatic disease.    This prompted a CT scan of the chest with contrast which showed bilateral axillary and mediastinal adenopathy, borderline hilar lymph nodes bilaterally, findings likely flecked metastatic lymphadenopathy, moderate bilateral pleural effusions, extensive interstitial thickening and reticulonodular opacities throughout the lungs, cannot exclude lymphangitic spread of tumor, diffuse skeletal metastases.  The patient's subsequent history is as detailed below.   PAST MEDICAL HISTORY: Past Medical History:  Diagnosis Date   Cancer [facility])    Family history of breast cancer    Family history of colon cancer    Family history of melanoma    Family history of prostate cancer    Gallstones    Glaucoma    Mastitis    reports history of recurrent mastitis   Metastatic breast cancer ([place])    Obesity     PAST SURGICAL HISTORY: Past Surgical History:  Procedure Laterality Date   GLAUCOMA SURGERY  late 1990's   IR THORACENTESIS ASP PLEURAL SPACE W/IMG GUIDE  10/28/2018   IR THORACENTESIS ASP PLEURAL SPACE W/IMG GUIDE  10/29/2018   OPEN REDUCTION INTERNAL FIXATION (ORIF) DISTAL RADIAL FRACTURE Left 01/09/2019   Procedure: OPEN REDUCTION INTERNAL FIXATION (ORIF) HUMERAL FRACTURE;  Surgeon: Netta Cedars, MD;  Location: [facility];  Service: Orthopedics;  Laterality: Left;    FAMILY HISTORY: Family History  Problem Relation Age of Onset   Breast cancer Sister        in her 39's-70s   Heart disease Brother        CABG   Heart disease Brother        CABG   Skin cancer Brother        melanoma   Colon cancer Cousin    Heart attack Mother    Heart attack Father    Prostate cancer Brother    Patient's father passed away at age 37, and her mother at 97, both from heart attacks. The patient has 8 siblings, 6 brothers and 1 sister.  Her sister was diagnosed with breast cancer. One brother has prostate cancer, another brother had a melanoma removed.    GYNECOLOGIC HISTORY:  Menarche: 60 years old Age at first live birth: 60 years old [place] P 1 LMP age 28 Contraceptive: used for 2-3 years with no problems HRT never used  Hysterectomy? no   SOCIAL HISTORY:  Suzanne [name] worked for an Engineer, manufacturing systems. She is now on disability. She is widowed. She lives at home with her daughter.  Suzanne [name], 37, who is a Psychologist, occupational at [employer] center.  The patient has no grandchildren. She is not a Ambulance person.   ADVANCED DIRECTIVES: not on file; she has the paperwork already. She intends to name her daughter as her HCPOA.   HEALTH MAINTENANCE: Social History   Tobacco Use   Smoking status: Never   Smokeless tobacco: Never   Tobacco comments:    second hand smoke exposure  Vaping Use   Vaping  Use: Never used  Substance Use Topics   Alcohol use: No   Drug use: No    Colonoscopy:   PAP:   Bone density:  Mammography:   No Known Allergies  Current Outpatient Medications  Medication Sig Dispense Refill   anastrozole (ARIMIDEX) 1 MG tablet Take 1 tablet (1 mg total) by mouth daily. 90 tablet 4   antiseptic oral rinse (BIOTENE) LIQD 15 mLs by Mouth Rinse route as needed for dry mouth. (Patient not taking: No sig reported)     Blood Pressure Monitor MISC Medium sized cuff. Use to check blood pressure twice per day and more often if not feeling well. I10.0 1 each 0   calcium-vitamin D (OSCAL WITH D) 500-200 MG-UNIT tablet Take 1 tablet by mouth 3 (three) times daily. 90 tablet 1   cetirizine (ZYRTEC) 10 MG tablet Take 1 tablet (10 mg total) by mouth daily. 90 tablet 3   cyanocobalamin ([employer] VITAMIN B12) 2000 MCG tablet Take 1 tablet (2,000 mcg total) by mouth daily. 90 tablet 4    famotidine (PEPCID) 20 MG tablet Take 1 tablet (20 mg total) by mouth 2 (two) times daily as needed for heartburn or indigestion. 30 tablet 3   ferrous sulfate 325 (65 FE) MG EC tablet TAKE ONE TABLET BY MOUTH THREE TIMES DAILY with meals 90 tablet 3   folic acid (FOLVITE) 1 MG tablet Take 1 tablet (1 mg total) by mouth daily. 90 tablet 4   furosemide (LASIX) 20 MG tablet Take 1 tablet (20 mg total) by mouth daily. 90 tablet 4   ketoconazole (NIZORAL) 2 % cream APPLY TO THE AFFECTED AREA(S) DAILY AS NEEDED 30 g 6   methocarbamol (ROBAXIN) 500 MG tablet Take 500 mg by mouth every 6 (six) hours as needed. (Patient not taking: No sig reported)     nystatin (MYCOSTATIN/NYSTOP) powder Apply 1 application topically as needed. 15 g 1   palbociclib (IBRANCE) 75 MG tablet TAKE 1 TABLET (75 MG TOTAL) BY MOUTH DAILY. TAKE FOR 21 DAYS ON, 7 DAYS OFF, REPEAT EVERY 28 DAYS 21 tablet 6   polyethylene glycol (MIRALAX / GLYCOLAX) 17 g packet Take 17 g by mouth as needed. 14 each 6   sertraline (ZOLOFT) 50 MG tablet TAKE 1 AND 1/2 TABLETS BY MOUTH EVERYDAY AT BEDTIME 135 tablet 0   ZOFRAN 8 MG tablet Take 1 tablet (8 mg total) by mouth every 8 (eight) hours. 20 tablet 1   No current facility-administered medications for this visit.    OBJECTIVE:   Vitals:   02/03/21 1249  BP: 129/76  Pulse: 86  Resp: 19  Temp: 97.8 F (36.6 C)  SpO2: 98%      Body mass index is 39.59 kg/m.   Wt Readings from Last 3 Encounters:  02/03/21 196 lb (88.9 kg)  01/06/21 193 lb 8 oz (87.8 kg)  11/14/20 198 lb 4.8 oz (89.9 kg)  ECOG FS: 2 GENERAL: Patient is a well appearing female in no acute distress examined in wheelchair HEENT:  Sclerae anicteric.  Mask in place. Neck is supple.  NODES:  No cervical, supraclavicular, or axillary lymphadenopathy palpated.  BREAST EXAM:  no sign of progression, fungal infection noted underneath bilateral breasts LUNGS:  Clear to auscultation bilaterally.  No wheezes or rhonchi. HEART:   Regular rate and rhythm. No murmur appreciated. ABDOMEN:  Soft, nontender.  Positive, normoactive bowel sounds. No organomegaly palpated. MSK:  No focal spinal tenderness to palpation.  EXTREMITIES:  No peripheral edema.  SKIN:  Clear with no obvious rashes or skin changes. No nail dyscrasia. NEURO:  Nonfocal. Well oriented.  Appropriate affect.   LAB RESULTS:  CMP     Component Value Date/Time   NA 144 02/03/2021 1222   K 3.2 (L) 02/03/2021 1222   CL 105 02/03/2021 1222   CO2 29 02/03/2021 1222   GLUCOSE 88 02/03/2021 1222   BUN 17 02/03/2021 1222   CREATININE 1.03 (H) 02/03/2021 1222   CREATININE 0.91 11/14/2020 1254   CALCIUM 9.5 02/03/2021 1222   PROT 6.5 02/03/2021 1222   ALBUMIN 3.4 (L) 02/03/2021 1222   AST 18 02/03/2021 1222   AST 20 11/14/2020 1254   ALT 10 02/03/2021 1222   ALT 14 11/14/2020 1254   ALKPHOS 108 02/03/2021 1222   BILITOT 0.4 02/03/2021 1222   BILITOT 0.2 (L) 11/14/2020 1254   GFRNONAA >60 02/03/2021 1222   GFRNONAA >60 11/14/2020 1254   GFRAA >60 02/24/2020 1454   GFRAA >60 07/24/2019 1453    No results found for: TOTALPROTELP, ALBUMINELP, A1GS, A2GS, BETS, BETA2SER, GAMS, MSPIKE, SPEI  No results found for: KPAFRELGTCHN, LAMBDASER, KAPLAMBRATIO  Lab Results  Component Value Date   WBC 3.3 (L) 02/03/2021   NEUTROABS PENDING 02/03/2021   HGB 9.6 (L) 02/03/2021   HCT 29.6 (L) 02/03/2021   MCV 115.2 (H) 02/03/2021   PLT 311 02/03/2021   No results found for: LABCA2  No components found for: [id number]  No results for input(s): INR in the last 168 hours.  No results found for: LABCA2  No results found for: JTT017  No results found for: CAN125  No results found for: BLT903  Lab Results  Component Value Date   CA2729 57.9 (H) 12/12/2020    No components found for: HGQUANT  No results found for: CEA1 / No results found for: CEA1   No results found for: AFPTUMOR  No results found for: CHROMOGRNA  No results found for: HGBA,  HGBA2QUANT, HGBFQUANT, HGBSQUAN (Hemoglobinopathy evaluation)   Lab Results  Component Value Date   LDH 215 (H) 07/07/2020    Lab Results  Component Value Date   IRON 40 (L) 07/07/2020   TIBC 448 (H) 07/07/2020   IRONPCTSAT 9 (L) 07/07/2020   (Iron and TIBC)  Lab Results  Component Value Date   FERRITIN 21 07/07/2020    Urinalysis    Component Value Date/Time   COLORURINE YELLOW 04/07/2020 1216   APPEARANCEUR CLEAR 04/07/2020 1216   LABSPEC 1.026 04/07/2020 1216   PHURINE 5.0 04/07/2020 1216   GLUCOSEU NEGATIVE 04/07/2020 1216   HGBUR NEGATIVE 04/07/2020 1216   BILIRUBINUR NEGATIVE 04/07/2020 1216   KETONESUR NEGATIVE 04/07/2020 1216   PROTEINUR NEGATIVE 04/07/2020 1216   NITRITE NEGATIVE 04/07/2020 1216   LEUKOCYTESUR SMALL (A) 04/07/2020 1216    STUDIES:  CT Chest W Contrast  Result Date: 01/17/2021 CLINICAL DATA:  60 year old female with history of right-sided breast cancer with metastatic disease. Evaluate treatment response. EXAM: CT CHEST WITH CONTRAST TECHNIQUE: Multidetector CT imaging of the chest was performed during intravenous contrast administration. CONTRAST:  51m OMNIPAQUE IOHEXOL 350 MG/ML SOLN COMPARISON:  Chest CT 05/14/2020. FINDINGS: Cardiovascular: Heart size is normal. There is no significant pericardial fluid, thickening or pericardial calcification. There is aortic atherosclerosis, as well as atherosclerosis of the great vessels of the mediastinum and the coronary arteries, including calcified atherosclerotic plaque in the left anterior descending, left circumflex and right coronary arteries. Mediastinum/Nodes: No pathologically enlarged mediastinal, internal mammary or hilar lymph nodes. Large hiatal  hernia. No axillary lymphadenopathy (a cluster of small nonenlarged lymph nodes is noted). Lungs/Pleura: Severe chronic elevation of the right hemidiaphragm again noted. This is associated with areas of passive subsegmental atelectasis and/or scarring in  the right lung. No acute consolidative airspace disease. No pleural effusions. No suspicious appearing pulmonary nodules or masses are noted. Upper Abdomen: Partially calcified gallstones in the gallbladder. Aortic atherosclerosis. Musculoskeletal: Diffuse lytic and sclerotic lesions noted throughout the visualized axial and appendicular skeleton, compatible with widespread metastatic disease to the bones, similar to the prior study. Chronic compression fractures at T11 and T12 with acute kyphotic deformity at this level, similar to the prior examination. Old healed fracture of the medial right clavicle with chronic posttraumatic deformity. Orthopedic fixation hardware in the proximal left humerus, incompletely imaged. IMPRESSION: 1. Widespread metastatic disease to the bones is very similar to the prior examination. 2. No other evidence of metastatic disease to the thorax. 3. Previously noted left axillary lymphadenopathy appears resolved. 4. Aortic atherosclerosis, in addition to 3 vessel coronary artery disease. Please note that although the presence of coronary artery calcium documents the presence of coronary artery disease, the severity of this disease and any potential stenosis cannot be assessed on this non-gated CT examination. Assessment for potential risk factor modification, dietary therapy or pharmacologic therapy may be warranted, if clinically indicated. 5. Cholelithiasis. Aortic Atherosclerosis (ICD10-I70.0). Electronically Signed   By: Vinnie Langton M.D.   On: 01/17/2021 09:02     ELIGIBLE FOR AVAILABLE RESEARCH PROTOCOL: no   ASSESSMENT: 60 y.o. [place], [place] woman presenting 10/26/2018 with right-sided inflammatory breast cancer, stage IV, involving lungs, lymph nodes and bones, as follows:  (a) chest CT scan 10/26/2018 shows bilateral pleural effusions, possible lymphangitic spread of tumor, diffuse bony metastatic disease, and significant axillary mediastinal and hilar  adenopathy  (b) bone scan 10/27/2018 is a "near [place]" consistent with widespread bony metastatic disease  (c) head CT with and without contrast 10/30/2018 shows no intracranial metastatic disease, multiple calvarial lesions  (d) CA-27-29 on 10/27/2018 was 1810.4  (1) pleural fluid from right thoracentesis 10/28/2018 confirms malignant cells consistent with a breast primary, strongly estrogen and progesterone receptor positive, HER-2 not amplified, with an MIB-1 of 2%  (2) anastrozole started 10/29/2018  (a) palbociclib started 11/13/2018 at 100 mg dose  (b) denosumab/xgeva started 11/13/2018   (C) palbociclib dose started  (3) associated problems:  (a) hypoxia secondary to effusions  (b) pain from bone lesions and left humeral and vertebral compression fractures  (c) right upper extremity lymphedema  (d) poor venous access  (4) genetics testing on 12/25/2018 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 STK11, TP53, TSC1, TSC2, and VHL.  The following genes were evaluated for sequence changes only: SDHA and HOXB13 c.251G>A variant only. The [facility] Melanoma Panel analyzed the following 9 genes: BAP1 BRCA2 CDK4 CDKN2A MITF POT1 PTEN RB1 Tp53.   (5) palliative radiation to the left humerus 07/20/2019 - 07/31/2019 Site Technique Total Dose (Gy) Dose per Fx (Gy) Completed Fx Beam Energies  Humerus, Left: Ext_Lt_humerus Complex 30/30 3 10/10 6X   (  6) anemia requiring transfusion:   (a)  B-12 level 134 FEB 2022: B-12 injectons started    PLAN: Suzanne [name] is here today for f/u of her breast cancer.  She has no clinical sign of progression.  She is tolerating her treatment with Xgeva, b12, Palbociclib, and  Anastrozole.  She will continue this.    Her most recent restaging was completed 12/2020 and showed no progression, and she is proceeding with bilateral mammogram in 2 days along with ultrasound.    Her BP was mildly low and she is asymptomatic.  I asked that they keep track of it this week and send me a Mychart message on Friday with her readings.    Yuritzy will continue her current treatment plan.  She will receive her flu vaccine today and pneumonia vaccine in 4 weeks.  We will see her back in 8 weeks for labs, f/u, and injection.    She knows to call for any questions that may arise between now and her next appointment.  We are happy to see her sooner if needed.   Total encounter time 30 minutes.* in face to face visit time, chart review, lab review, order entry and documentation of the encounter.  Wilber Bihari, NP 02/03/21 1:11 PM Medical Oncology and Hematology [facility] [place], [place] [address] Tel. [phone]    Fax. [phone]   *Total Encounter Time as defined by the Centers for Medicare and Medicaid Services includes, in addition to the face-to-face time of a patient visit (documented in the note above) non-face-to-face time: obtaining and reviewing outside history, ordering and reviewing medications, tests or procedures, care coordination (communications with other health care professionals or caregivers) and documentation in the medical record.

## 2021-03-07 DIAGNOSIS — C50811 Malignant neoplasm of overlapping sites of right female breast: Secondary | ICD-10-CM | POA: Diagnosis not present

## 2021-03-07 LAB — CANCER ANTIGEN 27.29: CA 27.29: 82.7 U/mL — ABNORMAL HIGH (ref 0.0–38.6)

## 2021-03-08 ENCOUNTER — Other Ambulatory Visit: Payer: Self-pay

## 2021-03-08 DIAGNOSIS — C50811 Malignant neoplasm of overlapping sites of right female breast: Secondary | ICD-10-CM | POA: Diagnosis not present

## 2021-03-08 NOTE — Patient Outreach (Signed)
Medicaid Managed Care Social Work Note  03/08/2021 Name:  Suzanne [name] MRN:  [id number] DOB:  [birth year]  Suzanne [name] is an 60 y.o. year old female who is a primary patient of Suzanne Pounds, NP.  The [facility] Managed Care Coordination team was consulted for assistance with:  Transportation Needs   Suzanne [name] was given information about Medicaid Managed Care Coordination team services today. Suzanne [name] Patient agreed to services and verbal consent obtained.  Engaged with patient  for by telephone forfollow up visit in response to referral for case management and/or care coordination services.   Assessments/Interventions:  Review of past medical history, allergies, medications, health status, including review of consultants reports, laboratory and other test data, was performed as part of comprehensive evaluation and provision of chronic care management services.  SDOH: (Social Determinant of Health) assessments and interventions performed:  BSW contacted patient to follow up. Patient stated she still has the number for the Suzanne [name] but is going to continue to work with [place]. Patient states she only has to contact them 24 hours in advance for transporttaion. No other services/resources are needed at this time. Advanced Directives Status:  Not addressed in this encounter.  Care Plan                 No Known Allergies  Medications Reviewed Today     Reviewed by Gardenia Phlegm, NP (Nurse Practitioner) on 03/06/21 at 1356  Med List Status: <None>   Medication Order Taking? Sig Documenting Provider Last Dose Status Informant  anastrozole (ARIMIDEX) 1 MG tablet [id number] No Take 1 tablet (1 mg total) by mouth daily. [name], Virgie Dad, MD Taking Active   antiseptic oral rinse Charlene Brooke) LIQD [id number] No 15 mLs by Mouth Rinse route as needed for dry mouth.  Patient not taking: No sig reported   [provider] Not Taking Active   Blood Pressure Monitor  MISC [id number] No Medium sized cuff. Use to check blood pressure twice per day and more often if not feeling [name]. I10.0 Suzanne Pounds, NP Taking Active   calcium-vitamin D (OSCAL WITH D) 500-200 MG-UNIT tablet [id number] No Take 1 tablet by mouth 3 (three) times daily. [name], Virgie Dad, MD Taking Active   cetirizine (ZYRTEC) 10 MG tablet [id number] No Take 1 tablet (10 mg total) by mouth daily. Suzanne Pounds, NP Taking Expired 10/17/20 2359   cyanocobalamin ((VITAMIN B-12)) injection 1,000 mcg [id number]   [name], Virgie Dad, MD  Active   cyanocobalamin ([employer] VITAMIN B12) 2000 MCG tablet [id number] No Take 1 tablet (2,000 mcg total) by mouth daily. [name], Virgie Dad, MD Taking Active   denosumab Delton See) injection 120 mg [id number]   [name], Virgie Dad, MD  Active   famotidine (PEPCID) 20 MG tablet [id number] No Take 1 tablet (20 mg total) by mouth 2 (two) times daily as needed for heartburn or indigestion. [name], Virgie Dad, MD Taking Active   ferrous sulfate 325 (65 FE) MG EC tablet [id number] No TAKE ONE TABLET BY MOUTH THREE TIMES DAILY with meals [name], Virgie Dad, MD Taking Active   folic acid (FOLVITE) 1 MG tablet [id number] No Take 1 tablet (1 mg total) by mouth daily. [name], Virgie Dad, MD Taking Active   furosemide (LASIX) 20 MG tablet [id number] No Take 1 tablet (20 mg total) by mouth daily. [name], Virgie Dad, MD Taking Active   ketoconazole (NIZORAL) 2 % cream [id number] No APPLY TO THE AFFECTED AREA(S) DAILY AS NEEDED [name], Sarajane Jews  C, MD Taking Active   methocarbamol (ROBAXIN) 500 MG tablet [id number] No Take 500 mg by mouth every 6 (six) hours as needed.  Patient not taking: No sig reported   [provider] Not Taking Active   nystatin (MYCOSTATIN/NYSTOP) powder [id number] No Apply 1 application topically as needed. Gardenia Phlegm, NP Taking Active   palbociclib [facility]) 75 MG tablet [id number]  TAKE 1 TABLET (75 MG TOTAL) BY MOUTH DAILY. TAKE FOR 21 DAYS ON, 7  DAYS OFF, REPEAT EVERY 28 DAYS [name], Virgie Dad, MD  Active   polyethylene glycol (MIRALAX / GLYCOLAX) 17 g packet [id number] No Take 17 g by mouth as needed. [name], Virgie Dad, MD Taking Active   sertraline (ZOLOFT) 50 MG tablet [id number]  TAKE 1 AND 1/2 TABLETS BY MOUTH EVERYDAY AT BEDTIME Suzanne Pounds, NP  Active   ZOFRAN 8 MG tablet [id number] No Take 1 tablet (8 mg total) by mouth every 8 (eight) hours. Gardenia Phlegm, NP Taking Active   Med List Note [name], RPH-CPP 11/13/19 2703): [name] filled at [place]            Patient Active Problem List   Diagnosis Date Noted   B12 deficiency anemia 08/15/2020   Intractable back pain 02/03/2019   Thoracic compression fracture, closed, initial encounter ([place]) 02/03/2019   Essential hypertension 02/03/2019   Genetic testing 01/27/2019   Acute pharyngitis    Bacteremia    FTT (failure to thrive) in adult    Fracture closed, humerus, shaft 01/09/2019   Pneumonia 12/25/2018   Acute respiratory disorder in immunocompromised patient ([place]) 12/25/2018   Family history of breast cancer    Family history of prostate cancer    Family history of melanoma    Family history of colon cancer    Pressure injury of skin 11/24/2018   Chronic respiratory failure with hypoxia and hypercapnia ([place]) 11/19/2018   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9, adult ([place]) 10/31/2018   Bone injury    Pleural effusion, bilateral    Shortness of breath    Abnormal breast finding    Hypokalemia    Breast skin changes    Macrocytic anemia    Bone metastasis (HCC)    Anemia, B12 deficiency 10/26/2018    Conditions to be addressed/monitored per PCP order:   transportation  Care Plan : RN Care Manager Plan of Care  Updates made by Ethelda Chick since 03/08/2021 12:00 AM     Problem: Health Management Coordination Needs      Long-Range Goal: Development of Plan of Care for needs related to Health Management   Start Date: 01/26/2021  Expected End Date: 05/26/2021  Priority: High  Note:   Current Barriers:  Chronic Disease Management support and education needs related to Health Maintenance-Ms. Hotard is working on applying for food benefits, but would like to decrease her monthly bills to help with affording groceries. She and her  daughter live together, neither drive and have to pay for transportation to get groceries-which adds to her grocery bill. Ms. Sabala would like to schedule a mammogram, but lost the number for scheduling given to her by Dr. Jana Hakim. She is working with Ovid Curd for medication management.  RNCM Clinical Goal(s):  Patient will verbalize understanding of plan for management of Health Maintenance attend all scheduled medical appointments: Oncology 9/9 continue to work with RN Care Manager to address care management and care coordination needs related to Health Maintenance  through collaboration with RN Care manager, provider, and care team.   Interventions: Inter-disciplinary care team collaboration (see longitudinal plan of care) Evaluation of current treatment plan related to  self management and patient's adherence to plan as established by provider 02/06/21: BSW contacted patient regarding transportation for running errands. Patient stated she is already signed up with [facility] and uses them for her  medical appointments. BSW provided patient with the telephone number for [facility] [phone]. Patient stated she would give them a call. No other resources are needed at this time. 03/08/21: BSW contacted patient to follow up. Patient stated she still has the number for the [facility] but is going to continue to work with [place]. Patient states she only has to contact them 24 hours in advance for transporttaion. No other services/resources are needed at this time.    Health Maintenance  (Status: New goal.) Evaluation of current treatment plan related to  Health Maintenance ,  self-management and patient's adherence to plan as established by provider. Discussed plans with patient for ongoing care management follow up and provided patient with direct contact information for care management team Advised patient to contact [place] for benefits, such as free phone and rewards; Reviewed scheduled/upcoming provider appointments including Oncology 9/9; Social Work referral for assistance with transportation for errands such as grocery shopping; Discussed plans with patient for ongoing care management follow up and provided patient with direct contact information for care management team; Provided patient with contact information to [place] [phone]   Patient Goals/Self-Care Activities: Patient will self administer medications as prescribed Patient will attend all scheduled provider appointments Patient will call pharmacy for medication refills Patient will call provider office for new concerns or questions Patient will work with BSW to address care coordination needs and will continue to work with the clinical team to address health care and disease management related needs.         Follow up:  Patient agrees to Care Plan and Follow-up.  Plan: The  Patient has been provided with contact information for the Managed Medicaid care management team and has  been advised to call with any health related questions or concerns.   Mickel Fuchs, BSW, [place] Managed Medicaid Team  [phone]

## 2021-03-08 NOTE — Patient Instructions (Signed)
Visit Information  Suzanne [name] was given information about Medicaid Managed Care team care coordination services as a part of their [place] Medicaid benefit. [name] verbally consented to engagement with the [facility] Managed Care team.   If you are experiencing a medical emergency, please call 911 or report to your local emergency department or urgent care.   If you have a non-emergency medical problem during routine business hours, please contact your provider's office and ask to speak with a nurse.   For questions related to your [facility], please call: [phone] or visit the homepage here: [email or web address]  If you would like to schedule transportation through your [facility], please call the following number at least 2 days in advance of your appointment: [phone].   Call the [place] at [phone], at any time, 24 hours a day, 7 days a week. If you are in danger or need immediate medical attention call 911.  If you would like help to quit smoking, call 1-800-QUIT-NOW 612 375 9990) OR Espaol: 1-855-Djelo-Ya (4-158-309-4076) o para ms informacin haga clic aqu or Text READY to 200-400 to register via text  [name] - following are the goals we discussed in your visit today:   Goals Addressed   None      The  Patient                                              has been provided with contact information for the Managed Medicaid care management team and has been advised to call with any health related questions or concerns.   Mickel Fuchs, BSW, [place] Managed Medicaid Team  [phone]   Following is a copy of your plan of care:

## 2021-03-09 ENCOUNTER — Other Ambulatory Visit: Payer: Self-pay

## 2021-03-09 ENCOUNTER — Ambulatory Visit
Admission: RE | Admit: 2021-03-09 | Discharge: 2021-03-09 | Disposition: A | Discharge status: Home / Self Care | Payer: Medicaid Other | Source: Ambulatory Visit | Attending: Adult Health | Admitting: Adult Health

## 2021-03-09 ENCOUNTER — Ambulatory Visit: Admission: RE | Admit: 2021-03-09 | Payer: Medicaid Other | Source: Ambulatory Visit

## 2021-03-09 ENCOUNTER — Encounter: Payer: Self-pay | Admitting: Oncology

## 2021-03-09 DIAGNOSIS — C50911 Malignant neoplasm of unspecified site of right female breast: Secondary | ICD-10-CM

## 2021-03-09 DIAGNOSIS — R922 Inconclusive mammogram: Secondary | ICD-10-CM | POA: Diagnosis not present

## 2021-03-09 DIAGNOSIS — C50811 Malignant neoplasm of overlapping sites of right female breast: Secondary | ICD-10-CM | POA: Diagnosis not present

## 2021-03-09 DIAGNOSIS — Z170 Estrogen receptor positive status [ER+]: Secondary | ICD-10-CM

## 2021-03-10 DIAGNOSIS — C50811 Malignant neoplasm of overlapping sites of right female breast: Secondary | ICD-10-CM | POA: Diagnosis not present

## 2021-03-13 DIAGNOSIS — C50811 Malignant neoplasm of overlapping sites of right female breast: Secondary | ICD-10-CM | POA: Diagnosis not present

## 2021-03-14 DIAGNOSIS — C50811 Malignant neoplasm of overlapping sites of right female breast: Secondary | ICD-10-CM | POA: Diagnosis not present

## 2021-03-15 DIAGNOSIS — C50811 Malignant neoplasm of overlapping sites of right female breast: Secondary | ICD-10-CM | POA: Diagnosis not present

## 2021-03-16 DIAGNOSIS — C50811 Malignant neoplasm of overlapping sites of right female breast: Secondary | ICD-10-CM | POA: Diagnosis not present

## 2021-03-17 ENCOUNTER — Other Ambulatory Visit (HOSPITAL_COMMUNITY): Payer: Self-pay

## 2021-03-17 DIAGNOSIS — C50811 Malignant neoplasm of overlapping sites of right female breast: Secondary | ICD-10-CM | POA: Diagnosis not present

## 2021-03-20 ENCOUNTER — Other Ambulatory Visit (HOSPITAL_COMMUNITY): Payer: Self-pay

## 2021-03-20 DIAGNOSIS — C50811 Malignant neoplasm of overlapping sites of right female breast: Secondary | ICD-10-CM | POA: Diagnosis not present

## 2021-03-21 DIAGNOSIS — C50811 Malignant neoplasm of overlapping sites of right female breast: Secondary | ICD-10-CM | POA: Diagnosis not present

## 2021-03-22 DIAGNOSIS — C50811 Malignant neoplasm of overlapping sites of right female breast: Secondary | ICD-10-CM | POA: Diagnosis not present

## 2021-03-23 DIAGNOSIS — C50811 Malignant neoplasm of overlapping sites of right female breast: Secondary | ICD-10-CM | POA: Diagnosis not present

## 2021-03-24 DIAGNOSIS — C50811 Malignant neoplasm of overlapping sites of right female breast: Secondary | ICD-10-CM | POA: Diagnosis not present

## 2021-03-27 ENCOUNTER — Other Ambulatory Visit: Payer: Self-pay

## 2021-03-27 ENCOUNTER — Other Ambulatory Visit: Payer: Self-pay | Admitting: *Deleted

## 2021-03-27 DIAGNOSIS — C50811 Malignant neoplasm of overlapping sites of right female breast: Secondary | ICD-10-CM | POA: Diagnosis not present

## 2021-03-27 NOTE — Patient Instructions (Signed)
Visit Information  Suzanne [name] was given information about Medicaid Managed Care team care coordination services as a part of their [place] Medicaid benefit. Suzanne [name] verbally consented to engagement with the [facility] Managed Care team.   If you are experiencing a medical emergency, please call 911 or report to your local emergency department or urgent care.   If you have a non-emergency medical problem during routine business hours, please contact your provider's office and ask to speak with a nurse.   For questions related to your [facility], please call: [phone] or visit the homepage here: [email or web address]  If you would like to schedule transportation through your [facility], please call the following number at least 2 days in advance of your appointment: [phone].   Call the [place] at [phone], at any time, 24 hours a day, 7 days a week. If you are in danger or need immediate medical attention call 911.  If you would like help to quit smoking, call 1-800-QUIT-NOW 331-179-2985) OR Espaol: 1-855-Djelo-Ya (8-921-194-1740) o para Suzanne informacin haga clic aqu or Text READY to 200-400 to register via text  Suzanne [name] - following are the goals we discussed in your visit today:   Goals Addressed   None     Please see education materials related to anemia and preventing pressure wounds provided by MyChart link.  Patient has access to MyChart and can view provided education  Telephone follow up appointment with Managed Medicaid care management team member scheduled for:06/05/21 @ 3:45pm  Suzanne Joiner RN, BSN Suzanne [name]   Following is a copy of your plan of care:  Patient Care Plan: General Plan of Care (Adult)  Completed 01/26/2021   Problem Identified: Health  Promotion or Disease Self-Management (General Plan of Care) Resolved 10/26/2020     Long-Range Goal: Self-Management Plan Developed Completed 10/26/2020  Start Date: 07/14/2020  Expected End Date: 10/26/2020  This Visit's Progress: On track  Recent Progress: On track  Priority: High  Note:   Current Barriers:  Care Coordination needs related to managing breast cancer treatment. Suzanne [name] is being treated for stage IV breast cancer. She and her daughter live together. Suzanne [name] is unable to drive and her daughter does not drive. This makes it difficult to get groceries and OTC medications. They have had groceries delivered, which is very expensive after paying the extra fees. Suzanne [name] is able to walk around in the house, but requires a wheelchair when going to the [place] for appointments. Her wheelchair is missing a screw on the leg support and the brake is not working correctly. They have called [employer] for assistance with fixing the wheelchair and were told they would need to bring the wheelchair to the office, which is in [facility] [place]. RNCM following up on wheelchair. Patient has not been contacted by [employer]. Suzanne [name] reports receiving a new wheelchair from [name]. She has been working with Ovid Curd and now her medications are being delivered, which is a big help. Suzanne [name] is needing to schedule a dental exam and needs a list of providers accepting Medicaid. She has not had a chance to follow up on resources received from [place], but plans to do so. Update-Suzanne [name] has not had a chance to schedule a dental exam or apply for food benefits. She has plans to apply this week. She is getting PCS M-F for 2-2.5 hours  a day, which is helpful. 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  Transportation barriers Difficulty obtaining medications Food Insecurities  Nurse Case Manager Clinical Goal(s):  patient will work with Care Guide to address needs related to procuring groceries and  assistance applying for food benefits-Met patient will meet with RN Care Manager to address barriers to managing her healthcare at home-Met patient will work with CM team pharmacist for medication management-Met  Interventions:  Inter-disciplinary care team collaboration (see longitudinal plan of care) Evaluation of current treatment plan related to anemia and patient's adherence to plan as established by provider. Reviewed medications with patient and discussed iron supplements Collaborated with Ovid Curd regarding patient having medication questions Discussed plans with patient for ongoing care management follow up and provided patient with direct contact information for care management team Provided patient with MyChart educational materials related to iron-rich foods Provided patient with Dental office accepting Medicaid-[facility] [phone] BSW spoke with patient. BSW provided patient with the website epass.uMourn.cz to apply for foodstamps online and the phone number for [facility] [phone]. Update: BSW spoke with patient to follow up on resources provided. Patient stated she has not had the chance to contact the resources yet, but plans on doing so soon. Patient stated no other resources were needed at this time.  Patient Goals/Self-Care Activities - begin a notebook of services in my neighborhood or community - call 211 when I need some help - follow-up on any referrals for help I am given - have a back-up plan - make a list of family or friends that I can call - call [place] [phone] to schedule a dental exam - call [phone] for medical transportation provided by [facility] - call to cancel if needed - keep a calendar with prescription refill dates - keep a calendar with appointment dates    Follow Up Plan: Telephone follow up appointment with Managed Medicaid care management team member scheduled for:10/18/20 @  3:30pm       Patient Care Plan: Medication Management     Problem Identified: Health Promotion or Disease Self-Management (General Plan of Care)      Goal: Medication Management   Note:   Evidence-based guidance:  Review biopsychosocial determinants of health screens.  Determine level of modifiable health risk.  Assess level of patient activation, level of readiness, importance and confidence to make changes.  Evoke change talk using open-ended questions, pros and cons, as well as looking forward.  Identify areas where behavior change may lead to improved health.  Partner with patient to develop a robust self-management plan that includes lifestyle factors, such as weight loss, exercise and healthy nutrition, as well as goals specific to disease risks.  Support patient and family/caregiver active participation in decision-making and self-management plan.  Implement additional goals and interventions based on identified risk factors to reduce health risk.  Facilitate advance care planning.  Review need for preventive screening based on age, sex, family history and health history.   Notes:      Patient Care Plan: RN Care Manager Plan of Care     Problem Identified: Health Management Coordination Needs      Long-Range Goal: Development of Plan of Care for needs related to Health Management   Start Date: 01/26/2021  Expected End Date: 05/26/2021  Priority: High  Note:   Current Barriers:  Chronic Disease Management support and education needs related to Health Maintenance-Suzanne. [place] is working on applying for food benefits, but  would like to decrease her monthly bills to help with affording groceries. She and her daughter live together, neither drive and have to pay for transportation to get groceries-which adds to her grocery bill. Suzanne. Yeager would like to schedule a mammogram, but lost the number for scheduling given to her by Dr. Jana Hakim. She is working with Ovid Curd for  medication management.  RNCM Clinical Goal(s):  Patient will verbalize understanding of plan for management of Health Maintenance attend all scheduled medical appointments: Lab and injection 11/7, Oncology 05/02/21 and PCP 06/07/21 continue to work with RN Care Manager to address care management and care coordination needs related to Health Maintenance  through collaboration with RN Care manager, provider, and care team.   Interventions: Inter-disciplinary care team collaboration (see longitudinal plan of care) Evaluation of current treatment plan related to  self management and patient's adherence to plan as established by provider 02/06/21: BSW contacted patient regarding transportation for running errands. Patient stated she is already signed up with [facility] and uses them for her medical appointments. BSW provided patient with the telephone number for [facility] [phone]. Patient stated she would give them a call. No other resources are needed at this time. 03/08/21: BSW contacted patient to follow up. Patient stated she still has the number for the [facility] but is going to continue to work with [place]. Patient states she only has to contact them 24 hours in advance for transporttaion. No other services/resources are needed at this time.    Health Maintenance  (Status: Goal on Track (progressing): YES.) Evaluation of current treatment plan related to  Health Maintenance ,  self-management and patient's adherence to plan as established by provider. Discussed plans with patient for ongoing care management follow up and provided patient with direct contact information for care management team Advised patient to contact [place] for benefits, such as free phone and rewards; Reviewed scheduled/upcoming provider appointments including Oncology 11/7 and 12/6, PCP 06/07/21; Social Work referral for assistance with transportation for errands such as grocery shopping; Discussed  plans with patient for ongoing care management follow up and provided patient with direct contact information for care management team; Reviewed medications SDOH assessment  Patient Goals/Self-Care Activities: Patient will self administer medications as prescribed Patient will attend all scheduled provider appointments Patient will call pharmacy for medication refills Patient will call provider office for new concerns or questions Patient will work with BSW to address care coordination needs and will continue to work with the clinical team to address health care and disease management related needs.

## 2021-03-27 NOTE — Patient Outreach (Signed)
Medicaid Managed Care   Nurse Care Manager Note  03/27/2021 Name:  Suzanne [name] MRN:  [id number] DOB:  05-02-1961  Suzanne [name] is an 60 y.o. year old female who is a primary patient of Suzanne Pounds, NP.  The [facility] Managed Care Coordination team was consulted for assistance with:    Breast cancer  [name] was given information about Medicaid Managed Care Coordination team services today. [name] Patient agreed to services and verbal consent obtained.  Engaged with patient by telephone for follow up visit in response to provider referral for case management and/or care coordination services.   Assessments/Interventions:  Review of past medical history, allergies, medications, health status, including review of consultants reports, laboratory and other test data, was performed as part of comprehensive evaluation and provision of chronic care management services.  SDOH (Social Determinants of Health) assessments and interventions performed: SDOH Interventions    Flowsheet Row Most Recent Value  SDOH Interventions   Social Connections Interventions Patient Refused  Transportation Interventions Intervention Not Indicated       Care Plan  No Known Allergies  Medications Reviewed Today     Reviewed by Melissa Montane, RN (Registered Nurse) on 03/27/21 at 1608  Med List Status: <None>   Medication Order Taking? Sig Documenting Provider Last Dose Status Informant  anastrozole (ARIMIDEX) 1 MG tablet [id number] Yes Take 1 tablet (1 mg total) by mouth daily. [name], Virgie Dad, MD Taking Active   antiseptic oral rinse Charlene Brooke) LIQD [id number] No 15 mLs by Mouth Rinse route as needed for dry mouth.  Patient not taking: No sig reported   [provider] Not Taking Active   Blood Pressure Monitor MISC [id number]  Medium sized cuff. Use to check blood pressure twice per day and more often if not feeling well. I10.0 Suzanne Pounds, NP  Active   calcium-vitamin  D (OSCAL WITH D) 500-200 MG-UNIT tablet [id number] Yes Take 1 tablet by mouth 3 (three) times daily. [name], Virgie Dad, MD Taking Active   cetirizine (ZYRTEC) 10 MG tablet [id number] Yes Take 1 tablet (10 mg total) by mouth daily. Suzanne Pounds, NP Taking Active   cyanocobalamin ([employer] VITAMIN B12) 2000 MCG tablet [id number] Yes Take 1 tablet (2,000 mcg total) by mouth daily. [name], Virgie Dad, MD Taking Active   famotidine (PEPCID) 20 MG tablet [id number] Yes Take 1 tablet (20 mg total) by mouth 2 (two) times daily as needed for heartburn or indigestion. [name], Virgie Dad, MD Taking Active   ferrous sulfate 325 (65 FE) MG EC tablet [id number] Yes TAKE ONE TABLET BY MOUTH THREE TIMES DAILY with meals [name], Virgie Dad, MD Taking Active   folic acid (FOLVITE) 1 MG tablet [id number] Yes Take 1 tablet (1 mg total) by mouth daily. [name], Virgie Dad, MD Taking Active   furosemide (LASIX) 20 MG tablet [id number] Yes Take 1 tablet (20 mg total) by mouth daily. [name], Virgie Dad, MD Taking Active   ketoconazole (NIZORAL) 2 % cream [id number] No APPLY TO THE AFFECTED AREA(S) DAILY AS NEEDED  Patient not taking: Reported on 03/27/2021   [name], Virgie Dad, MD Not Taking Active   methocarbamol (ROBAXIN) 500 MG tablet [id number] Yes Take 500 mg by mouth every 6 (six) hours as needed. [provider] Taking Active   nystatin (MYCOSTATIN/NYSTOP) powder [id number] Yes Apply 1 application topically as needed. Gardenia Phlegm, NP Taking Active   palbociclib [facility]) 75 MG tablet [id number] Yes TAKE 1 TABLET (75 MG TOTAL)  BY MOUTH DAILY. TAKE FOR 21 DAYS ON, 7 DAYS OFF, REPEAT EVERY 28 DAYS [name], Virgie Dad, MD Taking Active   polyethylene glycol (MIRALAX / GLYCOLAX) 17 g packet [id number] Yes Take 17 g by mouth as needed. [name], Virgie Dad, MD Taking Active   sertraline (ZOLOFT) 50 MG tablet [id number] Yes TAKE 1 AND 1/2 TABLETS BY MOUTH EVERYDAY AT BEDTIME Suzanne Pounds, NP Taking Active   ZOFRAN 8  MG tablet [id number] Yes Take 1 tablet (8 mg total) by mouth every 8 (eight) hours. Gardenia Phlegm, NP Taking Active   Med List Note [name], RPH-CPP 11/13/19 9024): [name] filled at [place]            Patient Active Problem List   Diagnosis Date Noted   B12 deficiency anemia 08/15/2020   Intractable back pain 02/03/2019   Thoracic compression fracture, closed, initial encounter ([place]) 02/03/2019   Essential hypertension 02/03/2019   Genetic testing 01/27/2019   Acute pharyngitis    Bacteremia    FTT (failure to thrive) in adult    Fracture closed, humerus, shaft 01/09/2019   Pneumonia 12/25/2018   Acute respiratory disorder in immunocompromised patient ([place]) 12/25/2018   Family history of breast cancer    Family history of prostate cancer    Family history of melanoma    Family history of colon cancer    Pressure injury of skin 11/24/2018   Chronic respiratory failure with hypoxia and hypercapnia ([place]) 11/19/2018   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   Hypophosphatemia 11/19/2018   Hypocalcemia 11/19/2018   Aspiration pneumonia (HCC) 11/19/2018   SOB (shortness of breath)    Palliative care by specialist    Carcinoma of breast, estrogen receptor positive, stage 4, right ([place]) 11/16/2018   Hypoalbuminemia 11/16/2018   Pleural effusion 11/14/2018   Goals of care, counseling/discussion 11/06/2018   Malignant neoplasm of overlapping sites of right breast in female, estrogen receptor positive ([place]) 10/31/2018   Lung metastasis ([place]) 10/31/2018   Pain from bone metastases ([place]) 10/31/2018   Morbid obesity with BMI of 40.0-44.9, adult ([place]) 10/31/2018   Bone injury    Pleural effusion, bilateral    Shortness of breath    Abnormal breast finding    Hypokalemia    Breast skin changes    Macrocytic anemia    Bone metastasis (HCC)    Anemia, B12 deficiency 10/26/2018    Conditions to be addressed/monitored per PCP order:   breast  cancer  Care Plan : RN Care Manager Plan of Care  Updates made by Melissa Montane, RN since 03/27/2021 12:00 AM     Problem: Health Management Coordination Needs      Long-Range Goal: Development of Plan of Care for needs related to Health Management   Start Date: 01/26/2021  Expected End Date: 05/26/2021  Priority: High  Note:   Current Barriers:  Chronic Disease Management support and education needs related to Health Maintenance-Ms. Peplinski is working on applying for food benefits, but would like to decrease her monthly bills to help with affording groceries. She and her daughter live together, neither drive and have to pay for transportation to get groceries-which adds to her grocery bill. Ms. Hansmann would like to schedule a mammogram, but lost the number for scheduling given to her by Dr. Jana Hakim. She is working with Ovid Curd for medication management.  RNCM Clinical Goal(s):  Patient will verbalize understanding of plan for management of Health Maintenance attend all scheduled medical appointments: Lab and injection  11/7, Oncology 05/02/21 and PCP 06/07/21 continue to work with RN Care Manager to address care management and care coordination needs related to Health Maintenance  through collaboration with RN Care manager, provider, and care team.   Interventions: Inter-disciplinary care team collaboration (see longitudinal plan of care) Evaluation of current treatment plan related to  self management and patient's adherence to plan as established by provider 02/06/21: BSW contacted patient regarding transportation for running errands. Patient stated she is already signed up with [facility] and uses them for her medical appointments. BSW provided patient with the telephone number for [facility] [phone]. Patient stated she would give them a call. No other resources are needed at this time. 03/08/21: BSW contacted patient to follow up. Patient stated she still has the number for the  [facility] but is going to continue to work with [place]. Patient states she only has to contact them 24 hours in advance for transporttaion. No other services/resources are needed at this time.    Health Maintenance  (Status: Goal on Track (progressing): YES.) Evaluation of current treatment plan related to  Health Maintenance ,  self-management and patient's adherence to plan as established by provider. Discussed plans with patient for ongoing care management follow up and provided patient with direct contact information for care management team Advised patient to contact [place] for benefits, such as free phone and rewards; Reviewed scheduled/upcoming provider appointments including Oncology 11/7 and 12/6, PCP 06/07/21; Social Work referral for assistance with transportation for errands such as grocery shopping; Discussed plans with patient for ongoing care management follow up and provided patient with direct contact information for care management team; Reviewed medications SDOH assessment  Patient Goals/Self-Care Activities: Patient will self administer medications as prescribed Patient will attend all scheduled provider appointments Patient will call pharmacy for medication refills Patient will call provider office for new concerns or questions Patient will work with BSW to address care coordination needs and will continue to work with the clinical team to address health care and disease management related needs.         Follow Up:  Patient agrees to Care Plan and Follow-up.  Plan: The Managed Medicaid care management team will reach out to the patient again over the next 60 days.  Date/time of next scheduled RN care management/care coordination outreach:  06/05/21 @ 3:45p  [name] RN, BSN [place]  [employer] manager

## 2021-03-28 DIAGNOSIS — R0602 Shortness of breath: Secondary | ICD-10-CM | POA: Diagnosis not present

## 2021-03-28 DIAGNOSIS — J9621 Acute and chronic respiratory failure with hypoxia: Secondary | ICD-10-CM | POA: Diagnosis not present

## 2021-03-28 DIAGNOSIS — C7801 Secondary malignant neoplasm of right lung: Secondary | ICD-10-CM | POA: Diagnosis not present

## 2021-03-28 DIAGNOSIS — S22000A Wedge compression fracture of unspecified thoracic vertebra, initial encounter for closed fracture: Secondary | ICD-10-CM | POA: Diagnosis not present

## 2021-03-28 DIAGNOSIS — C50811 Malignant neoplasm of overlapping sites of right female breast: Secondary | ICD-10-CM | POA: Diagnosis not present

## 2021-03-29 DIAGNOSIS — C50811 Malignant neoplasm of overlapping sites of right female breast: Secondary | ICD-10-CM | POA: Diagnosis not present

## 2021-03-30 DIAGNOSIS — C50811 Malignant neoplasm of overlapping sites of right female breast: Secondary | ICD-10-CM | POA: Diagnosis not present

## 2021-03-31 ENCOUNTER — Inpatient Hospital Stay: Payer: Medicaid Other | Attending: Adult Health

## 2021-03-31 ENCOUNTER — Other Ambulatory Visit: Payer: Self-pay

## 2021-03-31 ENCOUNTER — Inpatient Hospital Stay: Payer: Medicaid Other

## 2021-03-31 VITALS — BP 125/81 | HR 86 | Resp 18

## 2021-03-31 DIAGNOSIS — C7951 Secondary malignant neoplasm of bone: Secondary | ICD-10-CM | POA: Insufficient documentation

## 2021-03-31 DIAGNOSIS — D649 Anemia, unspecified: Secondary | ICD-10-CM

## 2021-03-31 DIAGNOSIS — R0602 Shortness of breath: Secondary | ICD-10-CM

## 2021-03-31 DIAGNOSIS — C50811 Malignant neoplasm of overlapping sites of right female breast: Secondary | ICD-10-CM

## 2021-03-31 DIAGNOSIS — J90 Pleural effusion, not elsewhere classified: Secondary | ICD-10-CM

## 2021-03-31 DIAGNOSIS — C50911 Malignant neoplasm of unspecified site of right female breast: Secondary | ICD-10-CM | POA: Insufficient documentation

## 2021-03-31 DIAGNOSIS — E538 Deficiency of other specified B group vitamins: Secondary | ICD-10-CM | POA: Insufficient documentation

## 2021-03-31 DIAGNOSIS — Z6841 Body Mass Index (BMI) 40.0 and over, adult: Secondary | ICD-10-CM

## 2021-03-31 DIAGNOSIS — Z170 Estrogen receptor positive status [ER+]: Secondary | ICD-10-CM

## 2021-03-31 DIAGNOSIS — C7801 Secondary malignant neoplasm of right lung: Secondary | ICD-10-CM

## 2021-03-31 LAB — CBC WITH DIFFERENTIAL/PLATELET
Abs Immature Granulocytes: 0.01 10*3/uL (ref 0.00–0.07)
Basophils Absolute: 0.1 10*3/uL (ref 0.0–0.1)
Basophils Relative: 2 %
Eosinophils Absolute: 0.1 10*3/uL (ref 0.0–0.5)
Eosinophils Relative: 2 %
HCT: 26.8 % — ABNORMAL LOW (ref 36.0–46.0)
Hemoglobin: 8.7 g/dL — ABNORMAL LOW (ref 12.0–15.0)
Immature Granulocytes: 0 %
Lymphocytes Relative: 17 %
Lymphs Abs: 0.6 10*3/uL — ABNORMAL LOW (ref 0.7–4.0)
MCH: 36.9 pg — ABNORMAL HIGH (ref 26.0–34.0)
MCHC: 32.5 g/dL (ref 30.0–36.0)
MCV: 113.6 fL — ABNORMAL HIGH (ref 80.0–100.0)
Monocytes Absolute: 0.3 10*3/uL (ref 0.1–1.0)
Monocytes Relative: 8 %
Neutro Abs: 2.2 10*3/uL (ref 1.7–7.7)
Neutrophils Relative %: 71 %
Platelets: 324 10*3/uL (ref 150–400)
RBC: 2.36 MIL/uL — ABNORMAL LOW (ref 3.87–5.11)
RDW: 15.2 % (ref 11.5–15.5)
Smear Review: NORMAL
WBC: 3.2 10*3/uL — ABNORMAL LOW (ref 4.0–10.5)
nRBC: 0 % (ref 0.0–0.2)

## 2021-03-31 LAB — COMPREHENSIVE METABOLIC PANEL
ALT: 8 U/L (ref 0–44)
AST: 17 U/L (ref 15–41)
Albumin: 3.5 g/dL (ref 3.5–5.0)
Alkaline Phosphatase: 115 U/L (ref 38–126)
Anion gap: 8 (ref 5–15)
BUN: 15 mg/dL (ref 8–23)
CO2: 30 mmol/L (ref 22–32)
Calcium: 9 mg/dL (ref 8.9–10.3)
Chloride: 102 mmol/L (ref 98–111)
Creatinine, Ser: 0.89 mg/dL (ref 0.44–1.00)
GFR, Estimated: >60 mL/min (ref >60)
Glucose, Bld: 103 mg/dL — ABNORMAL HIGH (ref 70–99)
Potassium: 3.8 mmol/L (ref 3.5–5.1)
Sodium: 140 mmol/L (ref 135–145)
Total Bilirubin: 0.6 mg/dL (ref 0.3–1.2)
Total Protein: 6.8 g/dL (ref 6.5–8.1)

## 2021-03-31 MED ORDER — CYANOCOBALAMIN 1000 MCG/ML IJ SOLN
1000.0000 ug | Freq: Once | INTRAMUSCULAR | Status: AC
  Administered 2021-03-31: 1000 ug via INTRAMUSCULAR
  Filled 2021-03-31: qty 1

## 2021-03-31 MED ORDER — DENOSUMAB 120 MG/1.7ML ~~LOC~~ SOLN
120.0000 mg | Freq: Once | SUBCUTANEOUS | Status: AC
  Administered 2021-03-31: 120 mg via SUBCUTANEOUS
  Filled 2021-03-31: qty 1.7

## 2021-03-31 NOTE — Patient Instructions (Signed)
Denosumab injection What is this medication? DENOSUMAB (den oh sue mab) slows bone breakdown. Prolia is used to treat osteoporosis in women after menopause and in men, and in people who are taking corticosteroids for 6 months or more. Xgeva is used to treat a high calcium level due to cancer and to prevent bone fractures and other bone problems caused by multiple myeloma or cancer bone metastases. Xgeva is also used to treat giant cell tumor of the bone. This medicine may be used for other purposes; ask your health care provider or pharmacist if you have questions. COMMON BRAND NAME(S): Prolia, XGEVA What should I tell my care team before I take this medication? They need to know if you have any of these conditions: dental disease having surgery or tooth extraction infection kidney disease low levels of calcium or Vitamin D in the blood malnutrition on hemodialysis skin conditions or sensitivity thyroid or parathyroid disease an unusual reaction to denosumab, other medicines, foods, dyes, or preservatives pregnant or trying to get pregnant breast-feeding How should I use this medication? This medicine is for injection under the skin. It is given by a health care professional in a hospital or clinic setting. A special MedGuide will be given to you before each treatment. Be sure to read this information carefully each time. For Prolia, talk to your pediatrician regarding the use of this medicine in children. Special care may be needed. For Xgeva, talk to your pediatrician regarding the use of this medicine in children. While this drug may be prescribed for children as young as 13 years for selected conditions, precautions do apply. Overdosage: If you think you have taken too much of this medicine contact a poison control center or emergency room at once. NOTE: This medicine is only for you. Do not share this medicine with others. What if I miss a dose? It is important not to miss your dose.  Call your doctor or health care professional if you are unable to keep an appointment. What may interact with this medication? Do not take this medicine with any of the following medications: other medicines containing denosumab This medicine may also interact with the following medications: medicines that lower your chance of fighting infection steroid medicines like prednisone or cortisone This list may not describe all possible interactions. Give your health care provider a list of all the medicines, herbs, non-prescription drugs, or dietary supplements you use. Also tell them if you smoke, drink alcohol, or use illegal drugs. Some items may interact with your medicine. What should I watch for while using this medication? Visit your doctor or health care professional for regular checks on your progress. Your doctor or health care professional may order blood tests and other tests to see how you are doing. Call your doctor or health care professional for advice if you get a fever, chills or sore throat, or other symptoms of a cold or flu. Do not treat yourself. This drug may decrease your body's ability to fight infection. Try to avoid being around people who are sick. You should make sure you get enough calcium and vitamin D while you are taking this medicine, unless your doctor tells you not to. Discuss the foods you eat and the vitamins you take with your health care professional. See your dentist regularly. Brush and floss your teeth as directed. Before you have any dental work done, tell your dentist you are receiving this medicine. Do not become pregnant while taking this medicine or for 5 months after   stopping it. Talk with your doctor or health care professional about your birth control options while taking this medicine. Women should inform their doctor if they wish to become pregnant or think they might be pregnant. There is a potential for serious side effects to an unborn child. Talk to  your health care professional or pharmacist for more information. What side effects may I notice from receiving this medication? Side effects that you should report to your doctor or health care professional as soon as possible: allergic reactions like skin rash, itching or hives, swelling of the face, lips, or tongue bone pain breathing problems dizziness jaw pain, especially after dental work redness, blistering, peeling of the skin signs and symptoms of infection like fever or chills; cough; sore throat; pain or trouble passing urine signs of low calcium like fast heartbeat, muscle cramps or muscle pain; pain, tingling, numbness in the hands or feet; seizures unusual bleeding or bruising unusually weak or tired Side effects that usually do not require medical attention (report to your doctor or health care professional if they continue or are bothersome): constipation diarrhea headache joint pain loss of appetite muscle pain runny nose tiredness upset stomach This list may not describe all possible side effects. Call your doctor for medical advice about side effects. You may report side effects to FDA at 1-800-FDA-1088. Where should I keep my medication? This medicine is only given in a clinic, doctor's office, or other health care setting and will not be stored at home. NOTE: This sheet is a summary. It may not cover all possible information. If you have questions about this medicine, talk to your doctor, pharmacist, or health care provider.  2022 Elsevier/Gold Standard (2017-09-20 00:00:00)  Vitamin B12 Injection What is this medication? Vitamin B12 (VAHY tuh min B12) prevents and treats low vitamin B12 levels in your body. It is used in people who do not get enough vitamin B12 from their diet or when their digestive tract does not absorb enough. Vitamin B12 plays an important role in maintaining the health of your nervous system and red blood cells. This medicine may be used  for other purposes; ask your health care provider or pharmacist if you have questions. COMMON BRAND NAME(S): B-12 Compliance Kit, B-12 Injection Kit, Cyomin, Dodex, LA-12, Nutri-Twelve, Physicians EZ Use B-12, Primabalt What should I tell my care team before I take this medication? They need to know if you have any of these conditions: Kidney disease Leber's disease Megaloblastic anemia An unusual or allergic reaction to cyanocobalamin, cobalt, other medications, foods, dyes, or preservatives Pregnant or trying to get pregnant Breast-feeding How should I use this medication? This medication is injected into a muscle or deeply under the skin. It is usually given in a clinic or care team's office. However, your care team may teach you how to inject yourself. Follow all instructions. Talk to your care team about the use of this medication in children. Special care may be needed. Overdosage: If you think you have taken too much of this medicine contact a poison control center or emergency room at once. NOTE: This medicine is only for you. Do not share this medicine with others. What if I miss a dose? If you are given your dose at a clinic or care team's office, call to reschedule your appointment. If you give your own injections, and you miss a dose, take it as soon as you can. If it is almost time for your next dose, take only that dose. Do  not take double or extra doses. What may interact with this medication? Colchicine Heavy alcohol intake This list may not describe all possible interactions. Give your health care provider a list of all the medicines, herbs, non-prescription drugs, or dietary supplements you use. Also tell them if you smoke, drink alcohol, or use illegal drugs. Some items may interact with your medicine. What should I watch for while using this medication? Visit your care team regularly. You may need blood work done while you are taking this medication. You may need to follow  a special diet. Talk to your care team. Limit your alcohol intake and avoid smoking to get the best benefit. What side effects may I notice from receiving this medication? Side effects that you should report to your care team as soon as possible: Allergic reactions--skin rash, itching, hives, swelling of the face, lips, tongue, or throat Swelling of the ankles, hands, or feet Trouble breathing Side effects that usually do not require medical attention (report to your care team if they continue or are bothersome): Diarrhea This list may not describe all possible side effects. Call your doctor for medical advice about side effects. You may report side effects to FDA at 1-800-FDA-1088. Where should I keep my medication? Keep out of the reach of children. Store at room temperature between 15 and 30 degrees C (59 and 85 degrees F). Protect from light. Throw away any unused medication after the expiration date. NOTE: This sheet is a summary. It may not cover all possible information. If you have questions about this medicine, talk to your doctor, pharmacist, or health care provider.  2022 Elsevier/Gold Standard (2020-07-27 00:00:00)

## 2021-04-01 LAB — CANCER ANTIGEN 27.29: CA 27.29: 79.7 U/mL — ABNORMAL HIGH (ref 0.0–38.6)

## 2021-04-03 ENCOUNTER — Other Ambulatory Visit: Payer: Self-pay | Admitting: Oncology

## 2021-04-03 ENCOUNTER — Other Ambulatory Visit: Payer: Self-pay | Admitting: *Deleted

## 2021-04-03 ENCOUNTER — Ambulatory Visit: Payer: Medicaid Other

## 2021-04-03 ENCOUNTER — Other Ambulatory Visit: Payer: Medicaid Other

## 2021-04-03 DIAGNOSIS — C50811 Malignant neoplasm of overlapping sites of right female breast: Secondary | ICD-10-CM

## 2021-04-03 DIAGNOSIS — Z170 Estrogen receptor positive status [ER+]: Secondary | ICD-10-CM

## 2021-04-04 ENCOUNTER — Other Ambulatory Visit: Payer: Self-pay

## 2021-04-04 ENCOUNTER — Inpatient Hospital Stay: Payer: Medicaid Other

## 2021-04-04 DIAGNOSIS — R0602 Shortness of breath: Secondary | ICD-10-CM

## 2021-04-04 DIAGNOSIS — C7951 Secondary malignant neoplasm of bone: Secondary | ICD-10-CM | POA: Diagnosis not present

## 2021-04-04 DIAGNOSIS — D649 Anemia, unspecified: Secondary | ICD-10-CM

## 2021-04-04 DIAGNOSIS — J90 Pleural effusion, not elsewhere classified: Secondary | ICD-10-CM

## 2021-04-04 DIAGNOSIS — Z170 Estrogen receptor positive status [ER+]: Secondary | ICD-10-CM

## 2021-04-04 DIAGNOSIS — C7801 Secondary malignant neoplasm of right lung: Secondary | ICD-10-CM

## 2021-04-04 LAB — CBC WITH DIFFERENTIAL (CANCER CENTER ONLY)
Abs Immature Granulocytes: 0.02 10*3/uL (ref 0.00–0.07)
Basophils Absolute: 0.1 10*3/uL (ref 0.0–0.1)
Basophils Relative: 2 %
Eosinophils Absolute: 0.1 10*3/uL (ref 0.0–0.5)
Eosinophils Relative: 2 %
HCT: 27.3 % — ABNORMAL LOW (ref 36.0–46.0)
Hemoglobin: 8.8 g/dL — ABNORMAL LOW (ref 12.0–15.0)
Immature Granulocytes: 1 %
Lymphocytes Relative: 16 %
Lymphs Abs: 0.5 10*3/uL — ABNORMAL LOW (ref 0.7–4.0)
MCH: 37 pg — ABNORMAL HIGH (ref 26.0–34.0)
MCHC: 32.2 g/dL (ref 30.0–36.0)
MCV: 114.7 fL — ABNORMAL HIGH (ref 80.0–100.0)
Monocytes Absolute: 0.2 10*3/uL (ref 0.1–1.0)
Monocytes Relative: 8 %
Neutro Abs: 2.1 10*3/uL (ref 1.7–7.7)
Neutrophils Relative %: 71 %
Platelet Count: 293 10*3/uL (ref 150–400)
RBC: 2.38 MIL/uL — ABNORMAL LOW (ref 3.87–5.11)
RDW: 15.4 % (ref 11.5–15.5)
Smear Review: NORMAL
WBC Count: 3 10*3/uL — ABNORMAL LOW (ref 4.0–10.5)
nRBC: 0 % (ref 0.0–0.2)

## 2021-04-04 LAB — COMPREHENSIVE METABOLIC PANEL
ALT: 9 U/L (ref 0–44)
AST: 17 U/L (ref 15–41)
Albumin: 3.4 g/dL — ABNORMAL LOW (ref 3.5–5.0)
Alkaline Phosphatase: 105 U/L (ref 38–126)
Anion gap: 10 (ref 5–15)
BUN: 17 mg/dL (ref 8–23)
CO2: 27 mmol/L (ref 22–32)
Calcium: 8.8 mg/dL — ABNORMAL LOW (ref 8.9–10.3)
Chloride: 103 mmol/L (ref 98–111)
Creatinine, Ser: 1.03 mg/dL — ABNORMAL HIGH (ref 0.44–1.00)
GFR, Estimated: >60 mL/min (ref >60)
Glucose, Bld: 117 mg/dL — ABNORMAL HIGH (ref 70–99)
Potassium: 3.5 mmol/L (ref 3.5–5.1)
Sodium: 140 mmol/L (ref 135–145)
Total Bilirubin: 0.4 mg/dL (ref 0.3–1.2)
Total Protein: 6.6 g/dL (ref 6.5–8.1)

## 2021-04-04 LAB — SAMPLE TO BLOOD BANK

## 2021-04-04 NOTE — Progress Notes (Signed)
Hgb 8.8 today.  Per Dr. Jana Hakim, patient does not need blood today.  RN called [facility] to reschedule transportation.

## 2021-04-05 DIAGNOSIS — C50811 Malignant neoplasm of overlapping sites of right female breast: Secondary | ICD-10-CM | POA: Diagnosis not present

## 2021-04-06 DIAGNOSIS — C50811 Malignant neoplasm of overlapping sites of right female breast: Secondary | ICD-10-CM | POA: Diagnosis not present

## 2021-04-07 ENCOUNTER — Other Ambulatory Visit: Payer: Self-pay | Admitting: Nurse Practitioner

## 2021-04-07 DIAGNOSIS — F329 Major depressive disorder, single episode, unspecified: Secondary | ICD-10-CM

## 2021-04-07 DIAGNOSIS — C50811 Malignant neoplasm of overlapping sites of right female breast: Secondary | ICD-10-CM | POA: Diagnosis not present

## 2021-04-08 NOTE — Telephone Encounter (Signed)
Requested Prescriptions  Pending Prescriptions Disp Refills  . sertraline (ZOLOFT) 50 MG tablet [Pharmacy Med Name: sertraline 50 mg tablet] 135 tablet 0    Sig: TAKE 1 AND 1/2 TABLETS BY MOUTH EVERYDAY AT BEDTIME     Psychiatry:  Antidepressants - SSRI Passed - 04/07/2021  2:52 PM      Passed - Valid encounter within last 6 months    Recent Outpatient Visits          4 months ago Primary hypertension   [place], [name], NP   9 months ago Encounter to establish care   [place], [name], NP   1 year ago Hypotension, unspecified hypotension type   [place], NP   1 year ago Hypotension, unspecified hypotension type   [place], MD   1 year ago Cellulitis of toe of right foot   [place], MD      Future Appointments            In 2 months [name], NP [place]

## 2021-04-10 DIAGNOSIS — C50811 Malignant neoplasm of overlapping sites of right female breast: Secondary | ICD-10-CM | POA: Diagnosis not present

## 2021-04-11 ENCOUNTER — Other Ambulatory Visit: Payer: Self-pay | Admitting: Oncology

## 2021-04-11 DIAGNOSIS — C50811 Malignant neoplasm of overlapping sites of right female breast: Secondary | ICD-10-CM | POA: Diagnosis not present

## 2021-04-12 DIAGNOSIS — C50811 Malignant neoplasm of overlapping sites of right female breast: Secondary | ICD-10-CM | POA: Diagnosis not present

## 2021-04-13 ENCOUNTER — Other Ambulatory Visit (HOSPITAL_COMMUNITY): Payer: Self-pay

## 2021-04-13 DIAGNOSIS — C50811 Malignant neoplasm of overlapping sites of right female breast: Secondary | ICD-10-CM | POA: Diagnosis not present

## 2021-04-14 DIAGNOSIS — C50811 Malignant neoplasm of overlapping sites of right female breast: Secondary | ICD-10-CM | POA: Diagnosis not present

## 2021-04-17 DIAGNOSIS — C50811 Malignant neoplasm of overlapping sites of right female breast: Secondary | ICD-10-CM | POA: Diagnosis not present

## 2021-04-18 ENCOUNTER — Other Ambulatory Visit (HOSPITAL_COMMUNITY): Payer: Self-pay

## 2021-04-18 ENCOUNTER — Telehealth: Payer: Self-pay | Admitting: *Deleted

## 2021-04-18 DIAGNOSIS — C50811 Malignant neoplasm of overlapping sites of right female breast: Secondary | ICD-10-CM | POA: Diagnosis not present

## 2021-04-19 DIAGNOSIS — C50811 Malignant neoplasm of overlapping sites of right female breast: Secondary | ICD-10-CM | POA: Diagnosis not present

## 2021-04-20 DIAGNOSIS — C50811 Malignant neoplasm of overlapping sites of right female breast: Secondary | ICD-10-CM | POA: Diagnosis not present

## 2021-04-21 DIAGNOSIS — C50811 Malignant neoplasm of overlapping sites of right female breast: Secondary | ICD-10-CM | POA: Diagnosis not present

## 2021-04-24 DIAGNOSIS — C50811 Malignant neoplasm of overlapping sites of right female breast: Secondary | ICD-10-CM | POA: Diagnosis not present

## 2021-04-26 DIAGNOSIS — C50811 Malignant neoplasm of overlapping sites of right female breast: Secondary | ICD-10-CM | POA: Diagnosis not present

## 2021-04-27 ENCOUNTER — Other Ambulatory Visit: Payer: Self-pay | Admitting: Oncology

## 2021-04-27 DIAGNOSIS — J9621 Acute and chronic respiratory failure with hypoxia: Secondary | ICD-10-CM | POA: Diagnosis not present

## 2021-04-27 DIAGNOSIS — C7801 Secondary malignant neoplasm of right lung: Secondary | ICD-10-CM | POA: Diagnosis not present

## 2021-04-27 DIAGNOSIS — C50811 Malignant neoplasm of overlapping sites of right female breast: Secondary | ICD-10-CM | POA: Diagnosis not present

## 2021-04-27 DIAGNOSIS — R0602 Shortness of breath: Secondary | ICD-10-CM | POA: Diagnosis not present

## 2021-04-27 DIAGNOSIS — S22000A Wedge compression fracture of unspecified thoracic vertebra, initial encounter for closed fracture: Secondary | ICD-10-CM | POA: Diagnosis not present

## 2021-04-28 ENCOUNTER — Ambulatory Visit (HOSPITAL_COMMUNITY): Payer: Medicaid Other

## 2021-04-28 DIAGNOSIS — C50811 Malignant neoplasm of overlapping sites of right female breast: Secondary | ICD-10-CM | POA: Diagnosis not present

## 2021-05-01 ENCOUNTER — Other Ambulatory Visit (HOSPITAL_COMMUNITY): Payer: Self-pay

## 2021-05-01 DIAGNOSIS — C50811 Malignant neoplasm of overlapping sites of right female breast: Secondary | ICD-10-CM | POA: Diagnosis not present

## 2021-05-02 ENCOUNTER — Inpatient Hospital Stay (HOSPITAL_BASED_OUTPATIENT_CLINIC_OR_DEPARTMENT_OTHER): Payer: Medicaid Other | Admitting: Oncology

## 2021-05-02 ENCOUNTER — Inpatient Hospital Stay: Payer: Medicaid Other | Attending: Adult Health

## 2021-05-02 ENCOUNTER — Other Ambulatory Visit: Payer: Self-pay

## 2021-05-02 ENCOUNTER — Inpatient Hospital Stay: Payer: Medicaid Other

## 2021-05-02 VITALS — BP 142/70 | HR 91 | Temp 98.1°F | Resp 16 | Ht 59.0 in | Wt 187.6 lb

## 2021-05-02 DIAGNOSIS — Z170 Estrogen receptor positive status [ER+]: Secondary | ICD-10-CM | POA: Diagnosis not present

## 2021-05-02 DIAGNOSIS — Z800 Family history of malignant neoplasm of digestive organs: Secondary | ICD-10-CM | POA: Insufficient documentation

## 2021-05-02 DIAGNOSIS — C7801 Secondary malignant neoplasm of right lung: Secondary | ICD-10-CM

## 2021-05-02 DIAGNOSIS — Z8042 Family history of malignant neoplasm of prostate: Secondary | ICD-10-CM | POA: Insufficient documentation

## 2021-05-02 DIAGNOSIS — C7951 Secondary malignant neoplasm of bone: Secondary | ICD-10-CM | POA: Diagnosis not present

## 2021-05-02 DIAGNOSIS — Z79811 Long term (current) use of aromatase inhibitors: Secondary | ICD-10-CM | POA: Diagnosis not present

## 2021-05-02 DIAGNOSIS — C50911 Malignant neoplasm of unspecified site of right female breast: Secondary | ICD-10-CM

## 2021-05-02 DIAGNOSIS — C50811 Malignant neoplasm of overlapping sites of right female breast: Secondary | ICD-10-CM

## 2021-05-02 DIAGNOSIS — E538 Deficiency of other specified B group vitamins: Secondary | ICD-10-CM | POA: Insufficient documentation

## 2021-05-02 DIAGNOSIS — C7802 Secondary malignant neoplasm of left lung: Secondary | ICD-10-CM | POA: Diagnosis not present

## 2021-05-02 DIAGNOSIS — R0602 Shortness of breath: Secondary | ICD-10-CM

## 2021-05-02 DIAGNOSIS — D649 Anemia, unspecified: Secondary | ICD-10-CM

## 2021-05-02 DIAGNOSIS — E669 Obesity, unspecified: Secondary | ICD-10-CM | POA: Diagnosis not present

## 2021-05-02 DIAGNOSIS — Z803 Family history of malignant neoplasm of breast: Secondary | ICD-10-CM | POA: Diagnosis not present

## 2021-05-02 DIAGNOSIS — Z79899 Other long term (current) drug therapy: Secondary | ICD-10-CM | POA: Diagnosis not present

## 2021-05-02 DIAGNOSIS — J90 Pleural effusion, not elsewhere classified: Secondary | ICD-10-CM

## 2021-05-02 DIAGNOSIS — Z6841 Body Mass Index (BMI) 40.0 and over, adult: Secondary | ICD-10-CM

## 2021-05-02 LAB — CBC WITH DIFFERENTIAL/PLATELET
Abs Immature Granulocytes: 0.02 10*3/uL (ref 0.00–0.07)
Basophils Absolute: 0.1 10*3/uL (ref 0.0–0.1)
Basophils Relative: 2 %
Eosinophils Absolute: 0.1 10*3/uL (ref 0.0–0.5)
Eosinophils Relative: 2 %
HCT: 26.6 % — ABNORMAL LOW (ref 36.0–46.0)
Hemoglobin: 8.4 g/dL — ABNORMAL LOW (ref 12.0–15.0)
Immature Granulocytes: 1 %
Lymphocytes Relative: 21 %
Lymphs Abs: 0.6 10*3/uL — ABNORMAL LOW (ref 0.7–4.0)
MCH: 34.6 pg — ABNORMAL HIGH (ref 26.0–34.0)
MCHC: 31.6 g/dL (ref 30.0–36.0)
MCV: 109.5 fL — ABNORMAL HIGH (ref 80.0–100.0)
Monocytes Absolute: 0.1 10*3/uL (ref 0.1–1.0)
Monocytes Relative: 4 %
Neutro Abs: 2.1 10*3/uL (ref 1.7–7.7)
Neutrophils Relative %: 70 %
Platelets: 311 10*3/uL (ref 150–400)
RBC: 2.43 MIL/uL — ABNORMAL LOW (ref 3.87–5.11)
RDW: 15.1 % (ref 11.5–15.5)
Smear Review: NORMAL
WBC: 2.9 10*3/uL — ABNORMAL LOW (ref 4.0–10.5)
nRBC: 0 % (ref 0.0–0.2)

## 2021-05-02 LAB — COMPREHENSIVE METABOLIC PANEL
ALT: 8 U/L (ref 0–44)
AST: 18 U/L (ref 15–41)
Albumin: 3.5 g/dL (ref 3.5–5.0)
Alkaline Phosphatase: 108 U/L (ref 38–126)
Anion gap: 10 (ref 5–15)
BUN: 18 mg/dL (ref 8–23)
CO2: 29 mmol/L (ref 22–32)
Calcium: 10 mg/dL (ref 8.9–10.3)
Chloride: 104 mmol/L (ref 98–111)
Creatinine, Ser: 1.09 mg/dL — ABNORMAL HIGH (ref 0.44–1.00)
GFR, Estimated: 57 mL/min — ABNORMAL LOW (ref >60)
Glucose, Bld: 114 mg/dL — ABNORMAL HIGH (ref 70–99)
Potassium: 3.7 mmol/L (ref 3.5–5.1)
Sodium: 143 mmol/L (ref 135–145)
Total Bilirubin: 0.4 mg/dL (ref 0.3–1.2)
Total Protein: 6.9 g/dL (ref 6.5–8.1)

## 2021-05-02 MED ORDER — DENOSUMAB 120 MG/1.7ML ~~LOC~~ SOLN
120.0000 mg | Freq: Once | SUBCUTANEOUS | Status: AC
  Administered 2021-05-02: 120 mg via SUBCUTANEOUS
  Filled 2021-05-02: qty 1.7

## 2021-05-02 MED ORDER — CYANOCOBALAMIN 1000 MCG/ML IJ SOLN
1000.0000 ug | Freq: Once | INTRAMUSCULAR | Status: AC
  Administered 2021-05-02: 1000 ug via INTRAMUSCULAR
  Filled 2021-05-02: qty 1

## 2021-05-02 NOTE — Progress Notes (Signed)
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  She is accompanied by her daughter Suzanne [name]  Since her last visit, she underwent bilateral diagnostic mammography with tomography and right breast ultrasonography at [facility] on 03/09/2021 showing: breast density category C; 4.1 cm right breast mass at 9 o'clock, which has not been biopsies but is presumed primary malignancy, with marked overlying skin thickening and tethering as well as peau d'orange appearance on physical exam; additional group of suspicious indeterminate calcifications posterior and slightly medial to dominant mass measuring 1.1 cm.  She is scheduled for chest CT on 05/04/2021.  Beryle continues on anastrozole.  She is tolerating this well.    She is also on palbociclib at 75 mg daily 21 days on 7 days off.  She tolerates this with no side effects that she is aware of.  She notes she is in the middle of her second week.   She receives [place] every 4 weeks. She has no issues with receiving this, and denies any dental concerns.   She was started on B12 replacement 07/28/2020 and receives this every 4 weeks.  Lab Results  Component Value Date   CA2729 79.7 (H) 03/31/2021   CA2729 82.7 (H) 03/06/2021   CA2729 68.9 (H) 02/03/2021   CA2729 57.9 (H) 12/12/2020   CA2729 46.5 (H) 11/14/2020     REVIEW OF SYSTEMS: Suzanne [name] generally is doing quite well.  She was having a great deal of difficulty with oral iron supplementation.  As soon as that was stopped her some of her reflux and other upper GI symptoms essentially resolved.  Despite her anemia she denies chest pain or pressure, shortness of breath, or any change in her functional status.  A detailed review of systems today was stable.   COVID 19 VACCINATION STATUS: Status post Pfizer x2 with booster December 2021   HISTORY OF CURRENT ILLNESS: From the original inpatient consult note:  [name] is a 60 year old female from [place], [place] without significant past medical history with exception of obesity, remote history of gallstones, and remote history of mastitis.  The patient presented to the hospital on 10/26/2018 with a 3-week history of worsening shortness of breath and lower extremity edema.  The patient reported that her shortness of breath worsened with exertion.  Her lower extremity edema did not improve with elevation.  She also had an intermittent pr oductive cough with rare sputum production.  The patient was noted to be anemic with a hemoglobin of 4.5 with a clumped platelets noted on admission.  Stool for occult blood was positive.  Patient received 2 units of packed red blood cells.    The patient reported a rash under her breast x1 week which has been foul-smelling.  She also reported that she has some discomfort and thickening/hardness of her right breast for several months.  She states that her right breast is much larger than her left.  She notes some occasional  bloody discharge from her breast once in a while.  A chest x-ray on admission showed diffuse bilateral interstitial opacity of unknown chronicity, findings could be related to interstitial inflammatory process, atypical/viral pneumonia, or possible metastatic disease.  There are also sclerotic and lytic lesions within the clavicles, bilateral ribs,  shoulders which was concerning for diffuse few skeletal metastatic disease.    This prompted a CT scan of the chest with contrast which showed bilateral axillary and mediastinal adenopathy, borderline hilar lymph nodes bilaterally, findings likely flecked metastatic lymphadenopathy, moderate bilateral pleural effusions, extensive interstitial thickening and reticulonodular opacities throughout the lungs, cannot exclude lymphangitic spread of tumor, diffuse skeletal metastases.  The patient's subsequent history is as detailed below.   PAST MEDICAL HISTORY: Past Medical History:  Diagnosis Date   Cancer [facility])    Family history of breast cancer    Family history of colon cancer    Family history of melanoma    Family history of prostate cancer    Gallstones    Glaucoma    Mastitis    reports history of recurrent mastitis   Metastatic breast cancer ([place])    Obesity     PAST SURGICAL HISTORY: Past Surgical History:  Procedure Laterality Date   GLAUCOMA SURGERY  late 1990's   IR THORACENTESIS ASP PLEURAL SPACE W/IMG GUIDE  10/28/2018   IR THORACENTESIS ASP PLEURAL SPACE W/IMG GUIDE  10/29/2018   OPEN REDUCTION INTERNAL FIXATION (ORIF) DISTAL RADIAL FRACTURE Left 01/09/2019   Procedure: OPEN REDUCTION INTERNAL FIXATION (ORIF) HUMERAL FRACTURE;  Surgeon: Netta Cedars, MD;  Location: [facility];  Service: Orthopedics;  Laterality: Left;    FAMILY HISTORY: Family History  Problem Relation Age of Onset   Breast cancer Sister        in her 69's-70s   Heart disease Brother        CABG   Heart disease Brother        CABG   Skin cancer Brother        melanoma   Colon cancer Cousin    Heart attack Mother    Heart attack Father    Prostate cancer Brother   Patient's father passed away at age 36, and her mother at 26, both from heart attacks. The patient has 8 siblings, 6 brothers and 1 sister.  Her sister was diagnosed with breast cancer. One brother has prostate cancer, another brother had a  melanoma removed.    GYNECOLOGIC HISTORY:  Menarche: 60 years old Age at first live birth: 60 years old [place] P 1 LMP age 61 Contraceptive: used for 2-3 years with no problems HRT never used  Hysterectomy? no   SOCIAL HISTORY:  Suzanne [name] worked for an Engineer, manufacturing systems. She is now on disability. She is widowed. She lives at home with her daughter.  Suzanne [name], 37, who is a Psychologist, occupational at [employer] center.  The patient has no grandchildren. She is not a Ambulance person.   ADVANCED DIRECTIVES: not on file; she has the paperwork already. She intends to name her daughter as her HCPOA.   HEALTH MAINTENANCE: Social History   Tobacco Use   Smoking status: Never   Smokeless tobacco: Never   Tobacco comments:    second hand smoke exposure  Vaping Use   Vaping Use: Never used  Substance Use Topics   Alcohol use: No   Drug use: No    Colonoscopy:   PAP:   Bone density:  Mammography:   No Known Allergies  Current Outpatient  Medications  Medication Sig Dispense Refill   anastrozole (ARIMIDEX) 1 MG tablet Take 1 tablet (1 mg total) by mouth daily. 90 tablet 4   antiseptic oral rinse (BIOTENE) LIQD 15 mLs by Mouth Rinse route as needed for dry mouth. (Patient not taking: No sig reported)     Blood Pressure Monitor MISC Medium sized cuff. Use to check blood pressure twice per day and more often if not feeling well. I10.0 1 each 0   calcium-vitamin D (OSCAL WITH D) 500-200 MG-UNIT tablet Take 1 tablet by mouth 3 (three) times daily. 90 tablet 1   cetirizine (ZYRTEC) 10 MG tablet Take 1 tablet (10 mg total) by mouth daily. 90 tablet 3   cyanocobalamin ([employer] VITAMIN B12) 2000 MCG tablet Take 1 tablet (2,000 mcg total) by mouth daily. 90 tablet 4   famotidine (PEPCID) 20 MG tablet Take 1 tablet (20 mg total) by mouth 2 (two) times daily as needed for heartburn or indigestion. 30 tablet 3   ferrous sulfate 325 (65 FE) MG EC tablet TAKE ONE TABLET BY MOUTH THREE TIMES DAILY with meals 90  tablet 3   folic acid (FOLVITE) 1 MG tablet Take 1 tablet (1 mg total) by mouth daily. 90 tablet 4   furosemide (LASIX) 20 MG tablet Take 1 tablet (20 mg total) by mouth daily. 90 tablet 4   ketoconazole (NIZORAL) 2 % cream APPLY TO THE AFFECTED AREA(S) DAILY AS NEEDED 60 g 6   methocarbamol (ROBAXIN) 500 MG tablet Take 500 mg by mouth every 6 (six) hours as needed.     nystatin (MYCOSTATIN/NYSTOP) powder Apply 1 application topically as needed. 15 g 1   OYSTER SHELL CALCIUM PLUS D 500-5 MG-MCG TABS TAKE ONE TABLET BY MOUTH EVERY MORNING and TAKE ONE TABLET BY MOUTH AT NOON and TAKE ONE TABLET BY MOUTH EVERY EVENING 90 tablet 1   palbociclib (IBRANCE) 75 MG tablet TAKE 1 TABLET (75 MG TOTAL) BY MOUTH DAILY. TAKE FOR 21 DAYS ON, 7 DAYS OFF, REPEAT EVERY 28 DAYS 21 tablet 6   polyethylene glycol (MIRALAX / GLYCOLAX) 17 g packet Take 17 g by mouth as needed. 14 each 6   sertraline (ZOLOFT) 50 MG tablet TAKE 1 AND 1/2 TABLETS BY MOUTH EVERYDAY AT BEDTIME 135 tablet 0   ZOFRAN 8 MG tablet Take 1 tablet (8 mg total) by mouth every 8 (eight) hours. 20 tablet 1   No current facility-administered medications for this visit.    OBJECTIVE: White woman examined in a wheelchair  Vitals:   05/02/21 1431  BP: (!) 142/70  Pulse: 91  Resp: 16  Temp: 98.1 F (36.7 C)  SpO2: 100%      Body mass index is 37.89 kg/m.   Wt Readings from Last 3 Encounters:  05/02/21 187 lb 9.6 oz (85.1 kg)  03/06/21 194 lb 3.2 oz (88.1 kg)  02/03/21 196 lb (88.9 kg)  ECOG FS: 2  Sclerae unicteric, EOMs intact Wearing a mask No cervical or supraclavicular adenopathy Lungs no rales or rhonchi Heart regular rate and rhythm Abd soft, nontender, positive bowel sounds MSK no focal spinal tenderness, no upper extremity lymphedema Neuro: nonfocal, well oriented, appropriate affect Breasts: The right breast has a palpable mass but no evidence of inflammatory disease.  There is evidence of a yeast rash in the inframammary  fold, but this is mild as compared to prior.  The left breast and both axillae are benign.   LAB RESULTS:  CMP     Component  Value Date/Time   NA 143 05/02/2021 1421   K 3.7 05/02/2021 1421   CL 104 05/02/2021 1421   CO2 29 05/02/2021 1421   GLUCOSE 114 (H) 05/02/2021 1421   BUN 18 05/02/2021 1421   CREATININE 1.09 (H) 05/02/2021 1421   CREATININE 0.91 11/14/2020 1254   CALCIUM 10.0 05/02/2021 1421   PROT 6.9 05/02/2021 1421   ALBUMIN 3.5 05/02/2021 1421   AST 18 05/02/2021 1421   AST 20 11/14/2020 1254   ALT 8 05/02/2021 1421   ALT 14 11/14/2020 1254   ALKPHOS 108 05/02/2021 1421   BILITOT 0.4 05/02/2021 1421   BILITOT 0.2 (L) 11/14/2020 1254   GFRNONAA 57 (L) 05/02/2021 1421   GFRNONAA >60 11/14/2020 1254   GFRAA >60 02/24/2020 1454   GFRAA >60 07/24/2019 1453    No results found for: TOTALPROTELP, ALBUMINELP, A1GS, A2GS, BETS, BETA2SER, GAMS, MSPIKE, SPEI  No results found for: KPAFRELGTCHN, LAMBDASER, KAPLAMBRATIO  Lab Results  Component Value Date   WBC 2.9 (L) 05/02/2021   NEUTROABS 2.1 05/02/2021   HGB 8.4 (L) 05/02/2021   HCT 26.6 (L) 05/02/2021   MCV 109.5 (H) 05/02/2021   PLT 311 05/02/2021   No results found for: LABCA2  No components found for: XQJJHE174  No results for input(s): INR in the last 168 hours.  No results found for: LABCA2  No results found for: YCX448  No results found for: JEH631  No results found for: SHF026  Lab Results  Component Value Date   CA2729 79.7 (H) 03/31/2021    No components found for: HGQUANT  No results found for: CEA1 / No results found for: CEA1   No results found for: AFPTUMOR  No results found for: CHROMOGRNA  No results found for: HGBA, HGBA2QUANT, HGBFQUANT, HGBSQUAN (Hemoglobinopathy evaluation)   Lab Results  Component Value Date   LDH 215 (H) 07/07/2020    Lab Results  Component Value Date   IRON 40 (L) 07/07/2020   TIBC 448 (H) 07/07/2020   IRONPCTSAT 9 (L) 07/07/2020   (Iron and  TIBC)  Lab Results  Component Value Date   FERRITIN 21 07/07/2020    Urinalysis    Component Value Date/Time   COLORURINE YELLOW 04/07/2020 1216   APPEARANCEUR CLEAR 04/07/2020 1216   LABSPEC 1.026 04/07/2020 1216   PHURINE 5.0 04/07/2020 1216   GLUCOSEU NEGATIVE 04/07/2020 1216   HGBUR NEGATIVE 04/07/2020 1216   BILIRUBINUR NEGATIVE 04/07/2020 1216   KETONESUR NEGATIVE 04/07/2020 1216   PROTEINUR NEGATIVE 04/07/2020 1216   NITRITE NEGATIVE 04/07/2020 1216   LEUKOCYTESUR SMALL (A) 04/07/2020 1216    STUDIES:  No results found.   ELIGIBLE FOR AVAILABLE RESEARCH PROTOCOL: no   ASSESSMENT: 60 y.o. [place], [place] woman presenting 10/26/2018 with right-sided inflammatory breast cancer, stage IV, involving lungs, lymph nodes and bones, as follows:  (a) chest CT scan 10/26/2018 shows bilateral pleural effusions, possible lymphangitic spread of tumor, diffuse bony metastatic disease, and significant axillary mediastinal and hilar adenopathy  (b) bone scan 10/27/2018 is a "near [place]" consistent with widespread bony metastatic disease  (c) head CT with and without contrast 10/30/2018 shows no intracranial metastatic disease, multiple calvarial lesions  (d) CA-27-29 on 10/27/2018 was 1810.4  (1) pleural fluid from right thoracentesis 10/28/2018 confirms malignant cells consistent with a breast primary, strongly estrogen and progesterone receptor positive, HER-2 not amplified, with an MIB-1 of 2%  (2) anastrozole started 10/29/2018  (a) palbociclib started 11/13/2018 at 100 mg dose  (b) denosumab/xgeva started 11/13/2018   (  C) palbociclib dose started  (3) associated problems:  (a) hypoxia secondary to effusions  (b) pain from bone lesions and left humeral and vertebral compression fractures  (c) right upper extremity lymphedema  (d) poor venous access  (4) genetics testing on 12/25/2018 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 STK11, TP53, TSC1, TSC2, and VHL.  The following genes were evaluated for sequence changes only: SDHA and HOXB13 [name]251G>A variant only. The [facility] Melanoma Panel analyzed the following 9 genes: BAP1 BRCA2 CDK4 CDKN2A MITF POT1 PTEN RB1 Tp53.   (5) palliative radiation to the left humerus 07/20/2019 - 07/31/2019 Site Technique Total Dose (Gy) Dose per Fx (Gy) Completed Fx Beam Energies  Humerus, Left: Ext_Lt_humerus Complex 30/30 3 10/10 6X   (6) anemia requiring transfusion:   (a) B-12 level 134 FEB 2022: B-12 injectons started    PLAN: [name] is now 2-1/2 years out from definitive diagnosis of metastatic breast cancer.  Her disease is clinically very well controlled on her current treatment and she is tolerating the anastrozole and palbociclib well.    She also receives Xgeva every 4 weeks and with that we gave her also a dose of B12.  This is bringing her MCV down steadily but has not yet begun to improve her hemoglobin.  I have added some iron studies to the next set of labs but at this point I do not think she needs an iron infusion.  She has not been able to tolerate oral iron.  She just had her right breast imaging and this gives [place] another measurable disease area to follow in the future  Once she has her chest CT scan we will call her with results  Otherwise she will have her next Xgeva on 05/30/2021 and her next visit with [place] 4 weeks after that.  Total encounter time 25 minutes.Suzanne Jews C. , MD  05/02/21 5:28 PM Medical Oncology and Hematology [facility] [place], [place] [address] Tel. [phone]    Fax. [phone]   I, Wilburn Mylar, am acting as scribe for Dr. Virgie Dad. .   I, Lurline Del MD, have reviewed the above documentation for accuracy and completeness, and I agree with the above.    *Total Encounter Time as defined by the Centers for Medicare and Medicaid Services includes, in addition to the face-to-face time of a patient visit (documented in the note above) non-face-to-face time: obtaining and reviewing outside history, ordering and reviewing medications, tests or procedures, care coordination (communications with other health care professionals or caregivers) and documentation in the medical record.

## 2021-05-03 DIAGNOSIS — C50811 Malignant neoplasm of overlapping sites of right female breast: Secondary | ICD-10-CM | POA: Diagnosis not present

## 2021-05-03 LAB — CANCER ANTIGEN 27.29: CA 27.29: 97.7 U/mL — ABNORMAL HIGH (ref 0.0–38.6)

## 2021-05-04 ENCOUNTER — Ambulatory Visit (HOSPITAL_COMMUNITY)
Admission: RE | Admit: 2021-05-04 | Discharge: 2021-05-04 | Disposition: A | Discharge status: Home / Self Care | Payer: Medicaid Other | Source: Ambulatory Visit | Attending: Oncology | Admitting: Oncology

## 2021-05-04 ENCOUNTER — Other Ambulatory Visit: Payer: Self-pay

## 2021-05-04 DIAGNOSIS — J479 Bronchiectasis, uncomplicated: Secondary | ICD-10-CM | POA: Diagnosis not present

## 2021-05-04 DIAGNOSIS — Z170 Estrogen receptor positive status [ER+]: Secondary | ICD-10-CM | POA: Insufficient documentation

## 2021-05-04 DIAGNOSIS — C50811 Malignant neoplasm of overlapping sites of right female breast: Secondary | ICD-10-CM | POA: Diagnosis present

## 2021-05-04 DIAGNOSIS — I700 Atherosclerosis of aorta: Secondary | ICD-10-CM | POA: Diagnosis not present

## 2021-05-04 MED ORDER — SODIUM CHLORIDE (PF) 0.9 % IJ SOLN
INTRAMUSCULAR | Status: AC
  Filled 2021-05-04: qty 50

## 2021-05-04 MED ORDER — IOHEXOL 350 MG/ML SOLN
60.0000 mL | Freq: Once | INTRAVENOUS | Status: AC | PRN
  Administered 2021-05-04: 60 mL via INTRAVENOUS

## 2021-05-05 DIAGNOSIS — C50811 Malignant neoplasm of overlapping sites of right female breast: Secondary | ICD-10-CM | POA: Diagnosis not present

## 2021-05-08 ENCOUNTER — Telehealth: Payer: Self-pay

## 2021-05-08 NOTE — Telephone Encounter (Signed)
Called pt to give below results. Pt verbalized thanks and understanding.

## 2021-05-08 NOTE — Telephone Encounter (Signed)
-----   Message from Gardenia Phlegm, NP sent at 05/07/2021  1:08 PM EST ----- Please call patient.  Great news on scans.  No new cancer.  The scans still show cancer in the bone, but CT scans often show this when the bones are healing.  Thanks!   ----- Message ----- From: Interface, Rad Results In Sent: 05/05/2021   2:23 PM EST To: Chauncey Cruel, MD

## 2021-05-09 ENCOUNTER — Other Ambulatory Visit (HOSPITAL_COMMUNITY): Payer: Self-pay

## 2021-05-11 ENCOUNTER — Encounter: Payer: Self-pay | Admitting: Oncology

## 2021-05-11 NOTE — Telephone Encounter (Signed)
No entry 

## 2021-05-12 DIAGNOSIS — C50811 Malignant neoplasm of overlapping sites of right female breast: Secondary | ICD-10-CM | POA: Diagnosis not present

## 2021-05-15 DIAGNOSIS — C50811 Malignant neoplasm of overlapping sites of right female breast: Secondary | ICD-10-CM | POA: Diagnosis not present

## 2021-05-16 ENCOUNTER — Other Ambulatory Visit (HOSPITAL_COMMUNITY): Payer: Self-pay

## 2021-05-16 DIAGNOSIS — C50811 Malignant neoplasm of overlapping sites of right female breast: Secondary | ICD-10-CM | POA: Diagnosis not present

## 2021-05-17 DIAGNOSIS — C50811 Malignant neoplasm of overlapping sites of right female breast: Secondary | ICD-10-CM | POA: Diagnosis not present

## 2021-05-18 DIAGNOSIS — C50811 Malignant neoplasm of overlapping sites of right female breast: Secondary | ICD-10-CM | POA: Diagnosis not present

## 2021-05-19 DIAGNOSIS — C50811 Malignant neoplasm of overlapping sites of right female breast: Secondary | ICD-10-CM | POA: Diagnosis not present

## 2021-05-22 DIAGNOSIS — C50811 Malignant neoplasm of overlapping sites of right female breast: Secondary | ICD-10-CM | POA: Diagnosis not present

## 2021-05-23 DIAGNOSIS — C50811 Malignant neoplasm of overlapping sites of right female breast: Secondary | ICD-10-CM | POA: Diagnosis not present

## 2021-05-24 DIAGNOSIS — C50811 Malignant neoplasm of overlapping sites of right female breast: Secondary | ICD-10-CM | POA: Diagnosis not present

## 2021-05-26 ENCOUNTER — Other Ambulatory Visit: Payer: Self-pay

## 2021-05-26 DIAGNOSIS — Z961 Presence of intraocular lens: Secondary | ICD-10-CM | POA: Diagnosis not present

## 2021-05-26 DIAGNOSIS — Z170 Estrogen receptor positive status [ER+]: Secondary | ICD-10-CM

## 2021-05-26 DIAGNOSIS — C50811 Malignant neoplasm of overlapping sites of right female breast: Secondary | ICD-10-CM | POA: Diagnosis not present

## 2021-05-26 DIAGNOSIS — H43813 Vitreous degeneration, bilateral: Secondary | ICD-10-CM | POA: Diagnosis not present

## 2021-05-28 DIAGNOSIS — S22000A Wedge compression fracture of unspecified thoracic vertebra, initial encounter for closed fracture: Secondary | ICD-10-CM | POA: Diagnosis not present

## 2021-05-28 DIAGNOSIS — R0602 Shortness of breath: Secondary | ICD-10-CM | POA: Diagnosis not present

## 2021-05-28 DIAGNOSIS — J9621 Acute and chronic respiratory failure with hypoxia: Secondary | ICD-10-CM | POA: Diagnosis not present

## 2021-05-28 DIAGNOSIS — C7801 Secondary malignant neoplasm of right lung: Secondary | ICD-10-CM | POA: Diagnosis not present

## 2021-05-29 DIAGNOSIS — C50811 Malignant neoplasm of overlapping sites of right female breast: Secondary | ICD-10-CM | POA: Diagnosis not present

## 2021-05-30 ENCOUNTER — Other Ambulatory Visit: Payer: Self-pay | Admitting: *Deleted

## 2021-05-30 ENCOUNTER — Inpatient Hospital Stay: Payer: Medicaid Other | Attending: Adult Health

## 2021-05-30 ENCOUNTER — Other Ambulatory Visit: Payer: Self-pay

## 2021-05-30 ENCOUNTER — Inpatient Hospital Stay: Payer: Medicaid Other

## 2021-05-30 ENCOUNTER — Encounter: Payer: Self-pay | Admitting: Oncology

## 2021-05-30 VITALS — BP 126/57 | HR 83 | Temp 98.3°F | Resp 20

## 2021-05-30 DIAGNOSIS — C7951 Secondary malignant neoplasm of bone: Secondary | ICD-10-CM

## 2021-05-30 DIAGNOSIS — R0602 Shortness of breath: Secondary | ICD-10-CM

## 2021-05-30 DIAGNOSIS — D518 Other vitamin B12 deficiency anemias: Secondary | ICD-10-CM

## 2021-05-30 DIAGNOSIS — E538 Deficiency of other specified B group vitamins: Secondary | ICD-10-CM | POA: Diagnosis present

## 2021-05-30 DIAGNOSIS — J90 Pleural effusion, not elsewhere classified: Secondary | ICD-10-CM

## 2021-05-30 DIAGNOSIS — G893 Neoplasm related pain (acute) (chronic): Secondary | ICD-10-CM

## 2021-05-30 DIAGNOSIS — D649 Anemia, unspecified: Secondary | ICD-10-CM | POA: Insufficient documentation

## 2021-05-30 DIAGNOSIS — C7802 Secondary malignant neoplasm of left lung: Secondary | ICD-10-CM

## 2021-05-30 DIAGNOSIS — C7801 Secondary malignant neoplasm of right lung: Secondary | ICD-10-CM

## 2021-05-30 DIAGNOSIS — Z170 Estrogen receptor positive status [ER+]: Secondary | ICD-10-CM

## 2021-05-30 DIAGNOSIS — Z6841 Body Mass Index (BMI) 40.0 and over, adult: Secondary | ICD-10-CM

## 2021-05-30 DIAGNOSIS — C50911 Malignant neoplasm of unspecified site of right female breast: Secondary | ICD-10-CM | POA: Diagnosis present

## 2021-05-30 LAB — CMP (CANCER CENTER ONLY)
ALT: 8 U/L (ref 0–44)
AST: 15 U/L (ref 15–41)
Albumin: 3.8 g/dL (ref 3.5–5.0)
Alkaline Phosphatase: 103 U/L (ref 38–126)
Anion gap: 6 (ref 5–15)
BUN: 19 mg/dL (ref 8–23)
CO2: 33 mmol/L — ABNORMAL HIGH (ref 22–32)
Calcium: 9.3 mg/dL (ref 8.9–10.3)
Chloride: 101 mmol/L (ref 98–111)
Creatinine: 0.99 mg/dL (ref 0.44–1.00)
GFR, Estimated: >60 mL/min (ref >60)
Glucose, Bld: 110 mg/dL — ABNORMAL HIGH (ref 70–99)
Potassium: 3.3 mmol/L — ABNORMAL LOW (ref 3.5–5.1)
Sodium: 140 mmol/L (ref 135–145)
Total Bilirubin: 0.3 mg/dL (ref 0.3–1.2)
Total Protein: 6.9 g/dL (ref 6.5–8.1)

## 2021-05-30 LAB — CBC WITH DIFFERENTIAL (CANCER CENTER ONLY)
Abs Immature Granulocytes: 0.01 10*3/uL (ref 0.00–0.07)
Basophils Absolute: 0.1 10*3/uL (ref 0.0–0.1)
Basophils Relative: 2 %
Eosinophils Absolute: 0.1 10*3/uL (ref 0.0–0.5)
Eosinophils Relative: 2 %
HCT: 20.9 % — ABNORMAL LOW (ref 36.0–46.0)
Hemoglobin: 6.5 g/dL — CL (ref 12.0–15.0)
Immature Granulocytes: 0 %
Lymphocytes Relative: 17 %
Lymphs Abs: 0.5 10*3/uL — ABNORMAL LOW (ref 0.7–4.0)
MCH: 33.3 pg (ref 26.0–34.0)
MCHC: 31.1 g/dL (ref 30.0–36.0)
MCV: 107.2 fL — ABNORMAL HIGH (ref 80.0–100.0)
Monocytes Absolute: 0.2 10*3/uL (ref 0.1–1.0)
Monocytes Relative: 5 %
Neutro Abs: 2.1 10*3/uL (ref 1.7–7.7)
Neutrophils Relative %: 74 %
Platelet Count: 255 10*3/uL (ref 150–400)
RBC: 1.95 MIL/uL — ABNORMAL LOW (ref 3.87–5.11)
RDW: 16.2 % — ABNORMAL HIGH (ref 11.5–15.5)
Smear Review: NORMAL
WBC Count: 2.8 10*3/uL — ABNORMAL LOW (ref 4.0–10.5)
nRBC: 0 % (ref 0.0–0.2)

## 2021-05-30 LAB — FERRITIN: Ferritin: 7 ng/mL — ABNORMAL LOW (ref 11–307)

## 2021-05-30 LAB — VITAMIN B12: Vitamin B-12: 3442 pg/mL — ABNORMAL HIGH (ref 180–914)

## 2021-05-30 LAB — IRON AND IRON BINDING CAPACITY (CC-WL,HP ONLY)
Iron: 58 ug/dL (ref 28–170)
Saturation Ratios: 11 % (ref 10.4–31.8)
TIBC: 539 ug/dL — ABNORMAL HIGH (ref 250–450)
UIBC: 481 ug/dL — ABNORMAL HIGH (ref 148–442)

## 2021-05-30 LAB — PREPARE RBC (CROSSMATCH)

## 2021-05-30 MED ORDER — CYANOCOBALAMIN 1000 MCG/ML IJ SOLN
1000.0000 ug | Freq: Once | INTRAMUSCULAR | Status: AC
  Administered 2021-05-30: 1000 ug via INTRAMUSCULAR
  Filled 2021-05-30: qty 1

## 2021-05-30 MED ORDER — DENOSUMAB 120 MG/1.7ML ~~LOC~~ SOLN
120.0000 mg | Freq: Once | SUBCUTANEOUS | Status: AC
Start: 2021-05-30 — End: 2021-05-30
  Administered 2021-05-30: 120 mg via SUBCUTANEOUS
  Filled 2021-05-30: qty 1.7

## 2021-05-30 NOTE — Patient Instructions (Signed)
Denosumab injection What is this medication? DENOSUMAB (den oh sue mab) slows bone breakdown. Prolia is used to treat osteoporosis in women after menopause and in men, and in people who are taking corticosteroids for 6 months or more. Delton See is used to treat a high calcium level due to cancer and to prevent bone fractures and other bone problems caused by multiple myeloma or cancer bone metastases. Delton See is also used to treat giant cell tumor of the bone. This medicine may be used for other purposes; ask your health care provider or pharmacist if you have questions. COMMON BRAND NAME(S): Prolia, XGEVA What should I tell my care team before I take this medication? They need to know if you have any of these conditions: dental disease having surgery or tooth extraction infection kidney disease low levels of calcium or Vitamin D in the blood malnutrition on hemodialysis skin conditions or sensitivity thyroid or parathyroid disease an unusual reaction to denosumab, other medicines, foods, dyes, or preservatives pregnant or trying to get pregnant breast-feeding How should I use this medication? This medicine is for injection under the skin. It is given by a health care professional in a hospital or clinic setting. A special MedGuide will be given to you before each treatment. Be sure to read this information carefully each time. For Prolia, talk to your pediatrician regarding the use of this medicine in children. Special care may be needed. For Delton See, talk to your pediatrician regarding the use of this medicine in children. While this drug may be prescribed for children as young as 13 years for selected conditions, precautions do apply. Overdosage: If you think you have taken too much of this medicine contact a poison control center or emergency room at once. NOTE: This medicine is only for you. Do not share this medicine with others. What if I miss a dose? It is important not to miss your dose.  Call your doctor or health care professional if you are unable to keep an appointment. What may interact with this medication? Do not take this medicine with any of the following medications: other medicines containing denosumab This medicine may also interact with the following medications: medicines that lower your chance of fighting infection steroid medicines like prednisone or cortisone This list may not describe all possible interactions. Give your health care provider a list of all the medicines, herbs, non-prescription drugs, or dietary supplements you use. Also tell them if you smoke, drink alcohol, or use illegal drugs. Some items may interact with your medicine. What should I watch for while using this medication? Visit your doctor or health care professional for regular checks on your progress. Your doctor or health care professional may order blood tests and other tests to see how you are doing. Call your doctor or health care professional for advice if you get a fever, chills or sore throat, or other symptoms of a cold or flu. Do not treat yourself. This drug may decrease your body's ability to fight infection. Try to avoid being around people who are sick. You should make sure you get enough calcium and vitamin D while you are taking this medicine, unless your doctor tells you not to. Discuss the foods you eat and the vitamins you take with your health care professional. See your dentist regularly. Brush and floss your teeth as directed. Before you have any dental work done, tell your dentist you are receiving this medicine. Do not become pregnant while taking this medicine or for 5 months after  stopping it. Talk with your doctor or health care professional about your birth control options while taking this medicine. Women should inform their doctor if they wish to become pregnant or think they might be pregnant. There is a potential for serious side effects to an unborn child. Talk to  your health care professional or pharmacist for more information. What side effects may I notice from receiving this medication? Side effects that you should report to your doctor or health care professional as soon as possible: allergic reactions like skin rash, itching or hives, swelling of the face, lips, or tongue bone pain breathing problems dizziness jaw pain, especially after dental work redness, blistering, peeling of the skin signs and symptoms of infection like fever or chills; cough; sore throat; pain or trouble passing urine signs of low calcium like fast heartbeat, muscle cramps or muscle pain; pain, tingling, numbness in the hands or feet; seizures unusual bleeding or bruising unusually weak or tired Side effects that usually do not require medical attention (report to your doctor or health care professional if they continue or are bothersome): constipation diarrhea headache joint pain loss of appetite muscle pain runny nose tiredness upset stomach This list may not describe all possible side effects. Call your doctor for medical advice about side effects. You may report side effects to FDA at 1-800-FDA-1088. Where should I keep my medication? This medicine is only given in a clinic, doctor's office, or other health care setting and will not be stored at home. NOTE: This sheet is a summary. It may not cover all possible information. If you have questions about this medicine, talk to your doctor, pharmacist, or health care provider.  2022 Elsevier/Gold Standard (2017-09-20 00:00:00)   Vitamin B12 Deficiency Vitamin B12 deficiency occurs when the body does not have enough vitamin B12, which is an important vitamin. The body needs this vitamin: To make red blood cells. To make DNA. This is the genetic material inside cells. To help the nerves work properly so they can carry messages from the brain to the body. Vitamin B12 deficiency can cause various health problems,  such as a low red blood cell count (anemia) or nerve damage. What are the causes? This condition may be caused by: Not eating enough foods that contain vitamin B12. Not having enough stomach acid and digestive fluids to properly absorb vitamin B12 from the food that you eat. Certain digestive system diseases that make it hard to absorb vitamin B12. These diseases include Crohn's disease, chronic pancreatitis, and cystic fibrosis. A condition in which the body does not make enough of a protein (intrinsic factor), resulting in too few red blood cells (pernicious anemia). Having a surgery in which part of the stomach or small intestine is removed. Taking certain medicines that make it hard for the body to absorb vitamin B12. These medicines include: Heartburn medicines (antacids and proton pump inhibitors). Certain antibiotic medicines. Some medicines that are used to treat diabetes, tuberculosis, gout, or high cholesterol. What increases the risk? The following factors may make you more likely to develop a B12 deficiency: Being older than age 75. Eating a vegetarian or vegan diet, especially while you are pregnant. Eating a poor diet while you are pregnant. Taking certain medicines. Having alcoholism. What are the signs or symptoms? In some cases, there are no symptoms of this condition. If the condition leads to anemia or nerve damage, various symptoms can occur, such as: Weakness. Fatigue. Loss of appetite. Weight loss. Numbness or tingling in your  hands and feet. Redness and burning of the tongue. Confusion or memory problems. Depression. Sensory problems, such as color blindness, ringing in the ears, or loss of taste. Diarrhea or constipation. Trouble walking. If anemia is severe, symptoms can include: Shortness of breath. Dizziness. Rapid heart rate (tachycardia). How is this diagnosed? This condition may be diagnosed with a blood test to measure the level of vitamin B12 in  your blood. You may also have other tests, including: A group of tests that measure certain characteristics of blood cells (complete blood count, CBC). A blood test to measure intrinsic factor. A procedure where a thin tube with a camera on the end is used to look into your stomach or intestines (endoscopy). Other tests may be needed to discover the cause of B12 deficiency. How is this treated? Treatment for this condition depends on the cause. This condition may be treated by: Changing your eating and drinking habits, such as: Eating more foods that contain vitamin B12. Drinking less alcohol or no alcohol. Getting vitamin B12 injections. Taking vitamin B12 supplements. Your health care provider will tell you which dosage is best for you. Follow these instructions at home: Eating and drinking  Eat lots of healthy foods that contain vitamin B12, including: Meats and poultry. This includes beef, pork, chicken, [place], and organ meats, such as liver. Seafood. This includes clams, rainbow trout, salmon, tuna, and haddock. Eggs. Cereal and dairy products that are fortified. This means that vitamin B12 has been added to the food. Check the label on the package to see if the food is fortified. The items listed above may not be a complete list of recommended foods and beverages. Contact a dietitian for more information. General instructions Get any injections that are prescribed by your health care provider. Take supplements only as told by your health care provider. Follow the directions carefully. Do not drink alcohol if your health care provider tells you not to. In some cases, you may only be asked to limit alcohol use. Keep all follow-up visits as told by your health care provider. This is important. Contact a health care provider if: Your symptoms come back. Get help right away if you: Develop shortness of breath. Have a rapid heart rate. Have chest pain. Become dizzy or lose  consciousness. Summary Vitamin B12 deficiency occurs when the body does not have enough vitamin B12. The main causes of vitamin B12 deficiency include dietary deficiency, digestive diseases, pernicious anemia, and having a surgery in which part of the stomach or small intestine is removed. In some cases, there are no symptoms of this condition. If the condition leads to anemia or nerve damage, various symptoms can occur, such as weakness, shortness of breath, and numbness. Treatment may include getting vitamin B12 injections or taking vitamin B12 supplements. Eat lots of healthy foods that contain vitamin B12. This information is not intended to replace advice given to you by your health care provider. Make sure you discuss any questions you have with your health care provider. Document Revised: 11/09/2020 Document Reviewed: 01/21/2018 Elsevier Patient Education  2022 [employer].

## 2021-05-31 DIAGNOSIS — C50811 Malignant neoplasm of overlapping sites of right female breast: Secondary | ICD-10-CM | POA: Diagnosis not present

## 2021-05-31 LAB — CANCER ANTIGEN 27.29: CA 27.29: 83.7 U/mL — ABNORMAL HIGH (ref 0.0–38.6)

## 2021-06-01 ENCOUNTER — Other Ambulatory Visit: Payer: Self-pay

## 2021-06-01 ENCOUNTER — Inpatient Hospital Stay: Payer: Medicaid Other

## 2021-06-01 DIAGNOSIS — C7951 Secondary malignant neoplasm of bone: Secondary | ICD-10-CM

## 2021-06-01 DIAGNOSIS — D518 Other vitamin B12 deficiency anemias: Secondary | ICD-10-CM

## 2021-06-01 MED ORDER — DIPHENHYDRAMINE HCL 25 MG PO CAPS
25.0000 mg | ORAL_CAPSULE | Freq: Once | ORAL | Status: AC
  Administered 2021-06-01: 25 mg via ORAL
  Filled 2021-06-01: qty 1

## 2021-06-01 MED ORDER — SODIUM CHLORIDE 0.9% IV SOLUTION
250.0000 mL | Freq: Once | INTRAVENOUS | Status: AC
  Administered 2021-06-01: 250 mL via INTRAVENOUS

## 2021-06-01 MED ORDER — ACETAMINOPHEN 325 MG PO TABS
650.0000 mg | ORAL_TABLET | Freq: Once | ORAL | Status: AC
  Administered 2021-06-01: 650 mg via ORAL
  Filled 2021-06-01: qty 2

## 2021-06-01 NOTE — Patient Instructions (Signed)
Blood Transfusion, Adult, Care After This sheet gives you information about how to care for yourself after your procedure. Your doctor may also give you more specific instructions. If you have problems or questions, contact your doctor. What can I expect after the procedure? After the procedure, it is common to have: Bruising and soreness at the IV site. A headache. Follow these instructions at home: Insertion site care   Follow instructions from your doctor about how to take care of your insertion site. This is where an IV tube was put into your vein. Make sure you: Wash your hands with soap and water before and after you change your bandage (dressing). If you cannot use soap and water, use hand sanitizer. Change your bandage as told by your doctor. Check your insertion site every day for signs of infection. Check for: Redness, swelling, or pain. Bleeding from the site. Warmth. Pus or a bad smell. General instructions Take over-the-counter and prescription medicines only as told by your doctor. Rest as told by your doctor. Go back to your normal activities as told by your doctor. Keep all follow-up visits as told by your doctor. This is important. Contact a doctor if: You have itching or red, swollen areas of skin (hives). You feel worried or nervous (anxious). You feel weak after doing your normal activities. You have redness, swelling, warmth, or pain around the insertion site. You have blood coming from the insertion site, and the blood does not stop with pressure. You have pus or a bad smell coming from the insertion site. Get help right away if: You have signs of a serious reaction. This may be coming from an allergy or the body's defense system (immune system). Signs include: Trouble breathing or shortness of breath. Swelling of the face or feeling warm (flushed). Fever or chills. Head, chest, or back pain. Dark pee (urine) or blood in the pee. Widespread rash. Fast  heartbeat. Feeling dizzy or light-headed. You may receive your blood transfusion in an outpatient setting. If so, you will be told whom to contact to report any reactions. These symptoms may be an emergency. Do not wait to see if the symptoms will go away. Get medical help right away. Call your local emergency services (911 in the [place] Do not drive yourself to the hospital. Summary Bruising and soreness at the IV site are common. Check your insertion site every day for signs of infection. Rest as told by your doctor. Go back to your normal activities as told by your doctor. Get help right away if you have signs of a serious reaction. This information is not intended to replace advice given to you by your health care provider. Make sure you discuss any questions you have with your health care provider. Document Revised: 09/08/2020 Document Reviewed: 11/06/2018 Elsevier Patient Education  2022 Elsevier Inc.  

## 2021-06-02 DIAGNOSIS — C50811 Malignant neoplasm of overlapping sites of right female breast: Secondary | ICD-10-CM | POA: Diagnosis not present

## 2021-06-02 LAB — TYPE AND SCREEN
ABO/RH(D): O POS
Antibody Screen: NEGATIVE
Unit division: 0
Unit division: 0

## 2021-06-02 LAB — BPAM RBC
Blood Product Expiration Date: 202301312359
Blood Product Expiration Date: 202301312359
ISSUE DATE / TIME: 202301051011
ISSUE DATE / TIME: 202301051011
Unit Type and Rh: 5100
Unit Type and Rh: 5100

## 2021-06-05 ENCOUNTER — Other Ambulatory Visit: Payer: Self-pay | Admitting: *Deleted

## 2021-06-05 ENCOUNTER — Other Ambulatory Visit: Payer: Self-pay

## 2021-06-05 DIAGNOSIS — C50811 Malignant neoplasm of overlapping sites of right female breast: Secondary | ICD-10-CM | POA: Diagnosis not present

## 2021-06-05 NOTE — Patient Instructions (Signed)
Visit Information  Ms. Spilman was given information about Medicaid Managed Care team care coordination services as a part of their [place] Medicaid benefit. [name] verbally consented to engagement with the [facility] Managed Care team.   If you are experiencing a medical emergency, please call 911 or report to your local emergency department or urgent care.   If you have a non-emergency medical problem during routine business hours, please contact your provider's office and ask to speak with a nurse.   For questions related to your [facility], please call: [phone] or visit the homepage here: [email or web address]  If you would like to schedule transportation through your [facility], please call the following number at least 2 days in advance of your appointment: [phone].   Call the [facility] at [phone], at any time, 24 hours a day, 7 days a week. If you are in danger or need immediate medical attention call 911.  If you would like help to quit smoking, call 1-800-QUIT-NOW (605)068-4903) OR Espaol: 1-855-Djelo-Ya (1-194-174-0814) o para ms informacin haga clic aqu or Text READY to 200-400 to register via text  Ms. Booton - following are the goals we discussed in your visit today:   Goals Addressed   None     Please see education materials related to anemia provided by MyChart link.  Patient has access to MyChart and can view provided education  Telephone follow up appointment with Managed Medicaid care management team member scheduled for:07/05/21 @ 3:45pm  [name] RN, BSN [place] RN Care Coordinator   Following is a copy of your plan of care:  Care Plan : RN Care Manager Plan of Care  Updates made by Melissa Montane, RN since 06/05/2021 12:00 AM     Problem: Health  Management Coordination Needs      Long-Range Goal: Development of Plan of Care for needs related to Health Management   Start Date: 01/26/2021  Expected End Date: 07/05/2021  Priority: High  Note:   Current Barriers:  Chronic Disease Management support and education needs related to Health Maintenance-Ms. Lechuga had a blood transfusion last week and is feeling better. Her Oncologist retired and she will meet her new Oncologist the end of January. She denies any needs at this time.   RNCM Clinical Goal(s):  Patient will verbalize understanding of plan for management of Health Maintenance attend all scheduled medical appointments:  PCP 06/07/21 and Oncology 1/31 continue to work with RN Care Manager to address care management and care coordination needs related to Health Maintenance  through collaboration with RN Care manager, provider, and care team.   Interventions: Inter-disciplinary care team collaboration (see longitudinal plan of care) Evaluation of current treatment plan related to  self management and patient's adherence to plan as established by provider   Health Maintenance  (Status: Goal on Track (progressing): YES.) Evaluation of current treatment plan related to  Health Maintenance ,  self-management and patient's adherence to plan as established by provider. Discussed plans with patient for ongoing care management follow up and provided patient with direct contact information for care management team Reviewed scheduled/upcoming provider appointments including PCP 06/07/21 and Oncology 1/31; Discussed plans with patient for ongoing care management follow up and provided patient with direct contact information for care management team; Reviewed medications SDOH assessment  Patient Goals/Self-Care Activities: Patient will self administer medications as prescribed Patient will attend all scheduled  provider appointments Patient will call pharmacy for medication refills Patient will  call provider office for new concerns or questions Patient will work with BSW to address care coordination needs and will continue to work with the clinical team to address health care and disease management related needs.

## 2021-06-05 NOTE — Patient Outreach (Signed)
Medicaid Managed Care   Nurse Care Manager Note  06/05/2021 Name:  Suzanne [name] MRN:  [id number] DOB:  [birth year]  Suzanne [name] is an 61 y.o. year old female who is a primary patient of [name], NP.  The [name] Managed Care Coordination team was consulted for assistance with:    Oncology  Suzanne [name] was given information about Medicaid Managed Care Coordination team services today. [name] Patient agreed to services and verbal consent obtained.  Engaged with patient by telephone for follow up visit in response to provider referral for case management and/or care coordination services.   Assessments/Interventions:  Review of past medical history, allergies, medications, health status, including review of consultants reports, laboratory and other test data, was performed as part of comprehensive evaluation and provision of chronic care management services.  SDOH (Social Determinants of Health) assessments and interventions performed: SDOH Interventions    Flowsheet Row Most Recent Value  SDOH Interventions   Food Insecurity Interventions Intervention Not Indicated  [Patient has a list of food pantries]  Housing Interventions Intervention Not Indicated  Transportation Interventions Intervention Not Indicated       Care Plan  No Known Allergies  Medications Reviewed Today     Reviewed by Melissa Montane, RN (Registered Nurse) on 06/05/21 at [place] List Status: <None>   Medication Order Taking? Sig Documenting Provider Last Dose Status Informant  anastrozole (ARIMIDEX) 1 MG tablet [id number] Yes Take 1 tablet (1 mg total) by mouth daily. [name], Virgie Dad, MD Taking Active   antiseptic oral rinse (BIOTENE) LIQD [id number]  15 mLs by Mouth Rinse route as needed for dry mouth.  Patient not taking: No sig reported   [provider]  Active   Blood Pressure Monitor MISC [id number]  Medium sized cuff. Use to check blood pressure twice per day and more  often if not feeling well. I10.0 [name], NP  Active   calcium-vitamin D (OSCAL WITH D) 500-200 MG-UNIT tablet [id number] No Take 1 tablet by mouth 3 (three) times daily.  Patient not taking: Reported on 06/05/2021   [name], Virgie Dad, MD Not Taking Active            Med Note (Shaune Malacara A   Mon Jun 05, 2021  1:19 PM) Taking twice daily  cetirizine (ZYRTEC) 10 MG tablet [id number]  Take 1 tablet (10 mg total) by mouth daily. [name], NP  Expired 03/27/21 2359   cyanocobalamin ([employer] VITAMIN B12) 2000 MCG tablet [id number] Yes Take 1 tablet (2,000 mcg total) by mouth daily. [name], Virgie Dad, MD Taking Active   famotidine (PEPCID) 20 MG tablet [id number] Yes Take 1 tablet (20 mg total) by mouth 2 (two) times daily as needed for heartburn or indigestion. [name], Virgie Dad, MD Taking Active   folic acid (FOLVITE) 1 MG tablet [id number] Yes Take 1 tablet (1 mg total) by mouth daily. [name], Virgie Dad, MD Taking Active   furosemide (LASIX) 20 MG tablet [id number] Yes Take 1 tablet (20 mg total) by mouth daily. [name], Virgie Dad, MD Taking Active   ketoconazole (NIZORAL) 2 % cream [id number] No APPLY TO THE AFFECTED AREA(S) DAILY AS NEEDED  Patient not taking: Reported on 06/05/2021   [name], Virgie Dad, MD Not Taking Active   nystatin (MYCOSTATIN/NYSTOP) powder [id number] No Apply 1 application topically as needed.  Patient not taking: Reported on 06/05/2021   Gardenia Phlegm, NP Not Taking Active   OYSTER SHELL CALCIUM PLUS  D 500-5 MG-MCG TABS [id number] Yes TAKE ONE TABLET BY MOUTH EVERY MORNING and TAKE ONE TABLET BY MOUTH AT NOON and TAKE ONE TABLET BY MOUTH EVERY EVENING [name], Virgie Dad, MD Taking Active            Med Note (Norberto Wishon A   Mon Jun 05, 2021  1:21 PM) Taking twice daily  palbociclib (IBRANCE) 75 MG tablet [id number] Yes TAKE 1 TABLET (75 MG TOTAL) BY MOUTH DAILY. TAKE FOR 21 DAYS ON, 7 DAYS OFF, REPEAT EVERY 28 DAYS [name], Virgie Dad, MD Taking Active    polyethylene glycol (MIRALAX / GLYCOLAX) 17 g packet [id number] Yes Take 17 g by mouth as needed. [name], Virgie Dad, MD Taking Active   sertraline (ZOLOFT) 50 MG tablet [id number] Yes TAKE 1 AND 1/2 TABLETS BY MOUTH EVERYDAY AT BEDTIME [name], NP Taking Active   ZOFRAN 8 MG tablet [id number] Yes Take 1 tablet (8 mg total) by mouth every 8 (eight) hours. Gardenia Phlegm, NP Taking Active   Med List Note [name], RPH-CPP 11/13/19 0272): [name] filled at [place]            Patient Active Problem List   Diagnosis Date Noted   B12 deficiency anemia 08/15/2020   Intractable back pain 02/03/2019   Thoracic compression fracture, closed, initial encounter ([place]) 02/03/2019   Essential hypertension 02/03/2019   Genetic testing 01/27/2019   Acute pharyngitis    Bacteremia    FTT (failure to thrive) in adult    Fracture closed, humerus, shaft 01/09/2019   Pneumonia 12/25/2018   Acute respiratory disorder in immunocompromised patient ([place]) 12/25/2018   Family history of breast cancer    Family history of prostate cancer    Family history of melanoma    Family history of colon cancer    Pressure injury of skin 11/24/2018   Chronic respiratory failure with hypoxia and hypercapnia ([place]) 11/19/2018   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   Hypophosphatemia 11/19/2018   Hypocalcemia 11/19/2018   Aspiration pneumonia (HCC) 11/19/2018   SOB (shortness of breath)    Palliative care by specialist    Carcinoma of breast, estrogen receptor positive, stage 4, right ([place]) 11/16/2018   Hypoalbuminemia 11/16/2018   Pleural effusion 11/14/2018   Goals of care, counseling/discussion 11/06/2018   Malignant neoplasm of overlapping sites of right breast in female, estrogen receptor positive ([place]) 10/31/2018   Lung metastasis ([place]) 10/31/2018   Pain from bone metastases ([place]) 10/31/2018   Morbid obesity with BMI of 40.0-44.9, adult ([place]) 10/31/2018   Bone  injury    Pleural effusion, bilateral    Shortness of breath    Abnormal breast finding    Hypokalemia    Breast skin changes    Macrocytic anemia    Bone metastasis (HCC)    Anemia, B12 deficiency 10/26/2018    Conditions to be addressed/monitored per PCP order:   Oncology  Care Plan : RN Care Manager Plan of Care  Updates made by Melissa Montane, RN since 06/05/2021 12:00 AM     Problem: Health Management Coordination Needs      Long-Range Goal: Development of Plan of Care for needs related to Health Management   Start Date: 01/26/2021  Expected End Date: 07/05/2021  Priority: High  Note:   Current Barriers:  Chronic Disease Management support and education needs related to Health Maintenance-Ms. Puller had a blood transfusion last week and is feeling better. Her Oncologist retired and she will meet her  new Oncologist the end of January. She denies any needs at this time.   RNCM Clinical Goal(s):  Patient will verbalize understanding of plan for management of Health Maintenance attend all scheduled medical appointments:  PCP 06/07/21 and Oncology 1/31 continue to work with RN Care Manager to address care management and care coordination needs related to Health Maintenance  through collaboration with RN Care manager, provider, and care team.   Interventions: Inter-disciplinary care team collaboration (see longitudinal plan of care) Evaluation of current treatment plan related to  self management and patient's adherence to plan as established by provider   Health Maintenance  (Status: Goal on Track (progressing): YES.) Evaluation of current treatment plan related to  Health Maintenance ,  self-management and patient's adherence to plan as established by provider. Discussed plans with patient for ongoing care management follow up and provided patient with direct contact information for care management team Reviewed scheduled/upcoming provider appointments including PCP 06/07/21 and  Oncology 1/31; Discussed plans with patient for ongoing care management follow up and provided patient with direct contact information for care management team; Reviewed medications SDOH assessment  Patient Goals/Self-Care Activities: Patient will self administer medications as prescribed Patient will attend all scheduled provider appointments Patient will call pharmacy for medication refills Patient will call provider office for new concerns or questions Patient will work with BSW to address care coordination needs and will continue to work with the clinical team to address health care and disease management related needs.         Follow Up:  Patient agrees to Care Plan and Follow-up.  Plan: The Managed Medicaid care management team will reach out to the patient again over the next 30 days.  Date/time of next scheduled RN care management/care coordination outreach:  07/05/21 @ 3:45pm  [name] RN, BSN [place] RN Care Coordinator

## 2021-06-06 DIAGNOSIS — C50811 Malignant neoplasm of overlapping sites of right female breast: Secondary | ICD-10-CM | POA: Diagnosis not present

## 2021-06-07 ENCOUNTER — Encounter: Payer: Self-pay | Admitting: Nurse Practitioner

## 2021-06-07 ENCOUNTER — Ambulatory Visit: Payer: Medicaid Other | Attending: Nurse Practitioner | Admitting: Nurse Practitioner

## 2021-06-07 DIAGNOSIS — C50811 Malignant neoplasm of overlapping sites of right female breast: Secondary | ICD-10-CM | POA: Diagnosis not present

## 2021-06-08 ENCOUNTER — Other Ambulatory Visit (HOSPITAL_COMMUNITY): Payer: Self-pay

## 2021-06-08 DIAGNOSIS — C50811 Malignant neoplasm of overlapping sites of right female breast: Secondary | ICD-10-CM | POA: Diagnosis not present

## 2021-06-09 DIAGNOSIS — C50811 Malignant neoplasm of overlapping sites of right female breast: Secondary | ICD-10-CM | POA: Diagnosis not present

## 2021-06-12 ENCOUNTER — Other Ambulatory Visit (HOSPITAL_COMMUNITY): Payer: Self-pay

## 2021-06-12 ENCOUNTER — Telehealth: Payer: Self-pay

## 2021-06-12 NOTE — Telephone Encounter (Signed)
Notified by [employer] that PA is not required.   Patient's copay is $4  [place] Patient [place] Phone [phone] Fax [phone] 06/12/2021 11:43 AM

## 2021-06-12 NOTE — Telephone Encounter (Signed)
Oral Oncology Patient Advocate Encounter   Received notification from [employer] that prior authorization for [name] is required.   PA submitted on CoverMyMeds Key [id number] Status is pending   Oral Oncology Clinic will continue to follow.  [place] Patient [place] Phone [phone] Fax [phone] 06/12/2021 9:16 AM

## 2021-06-13 DIAGNOSIS — C50811 Malignant neoplasm of overlapping sites of right female breast: Secondary | ICD-10-CM | POA: Diagnosis not present

## 2021-06-14 DIAGNOSIS — C50811 Malignant neoplasm of overlapping sites of right female breast: Secondary | ICD-10-CM | POA: Diagnosis not present

## 2021-06-15 DIAGNOSIS — C50811 Malignant neoplasm of overlapping sites of right female breast: Secondary | ICD-10-CM | POA: Diagnosis not present

## 2021-06-16 DIAGNOSIS — C50811 Malignant neoplasm of overlapping sites of right female breast: Secondary | ICD-10-CM | POA: Diagnosis not present

## 2021-06-19 DIAGNOSIS — C50811 Malignant neoplasm of overlapping sites of right female breast: Secondary | ICD-10-CM | POA: Diagnosis not present

## 2021-06-20 DIAGNOSIS — C50811 Malignant neoplasm of overlapping sites of right female breast: Secondary | ICD-10-CM | POA: Diagnosis not present

## 2021-06-21 DIAGNOSIS — C50811 Malignant neoplasm of overlapping sites of right female breast: Secondary | ICD-10-CM | POA: Diagnosis not present

## 2021-06-22 DIAGNOSIS — C50811 Malignant neoplasm of overlapping sites of right female breast: Secondary | ICD-10-CM | POA: Diagnosis not present

## 2021-06-23 DIAGNOSIS — C50811 Malignant neoplasm of overlapping sites of right female breast: Secondary | ICD-10-CM | POA: Diagnosis not present

## 2021-06-26 ENCOUNTER — Other Ambulatory Visit: Payer: Self-pay | Admitting: *Deleted

## 2021-06-26 DIAGNOSIS — C50811 Malignant neoplasm of overlapping sites of right female breast: Secondary | ICD-10-CM | POA: Diagnosis not present

## 2021-06-26 DIAGNOSIS — D518 Other vitamin B12 deficiency anemias: Secondary | ICD-10-CM

## 2021-06-26 DIAGNOSIS — Z170 Estrogen receptor positive status [ER+]: Secondary | ICD-10-CM

## 2021-06-26 NOTE — Progress Notes (Signed)
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   She is accompanied by her daughter Suzanne [name]  She underwent chest CT on 05/04/2021.  This showed no progression of her breast cancer.  She continues to have osseous metastatic disease.  [name] continues on anastrozole.  She is tolerating this well.    She is also on palbociclib at 75 mg daily 21 days on 7 days off.  She tolerates this with no side effects that she is aware of.  She notes she is in the middle of her second week.   She receives Niger every 4 weeks. She has no issues with receiving this, and denies any dental concerns.   She has pain in her arm that is intermittent.  She says that she has a slight sensory change just in 1 small area in her upper arm.  She is related this to previous surgery she has had.  This is intermittent and occurs only once or twice per week.  She receives b12 injections every four weeks for her anemia.  She also has iron deficiency and was instructed to stop taking her oral iron since it was causing constipation and nausea.  Her iron levels are very low today.  Lab Results  Component Value Date   CA2729 101.1 (H) 06/27/2021   CA2729 83.7 (H) 05/30/2021   CA2729 97.7 (H) 05/02/2021   CA2729 79.7 (H) 03/31/2021   CA2729 82.7 (H) 03/06/2021    REVIEW OF  SYSTEMS: Review of Systems  Constitutional:  Positive for fatigue (mild). Negative for appetite change, chills, fever and unexpected weight change.  HENT:   Negative for hearing loss, lump/mass and trouble swallowing.   Eyes:  Negative for eye problems and icterus.  Respiratory:  Negative for chest tightness, cough and shortness of breath.   Cardiovascular:  Negative for chest pain, leg swelling and palpitations.  Gastrointestinal:  Negative for abdominal distention, abdominal pain, constipation, diarrhea, nausea and vomiting.  Endocrine: Negative for hot flashes.  Genitourinary:  Negative for difficulty urinating.   Musculoskeletal:  Negative for arthralgias.  Skin:  Negative for itching and rash.  Neurological:  Negative for dizziness, extremity weakness, headaches and numbness.  Hematological:  Negative for adenopathy. Does not bruise/bleed easily.  Psychiatric/Behavioral:  Negative for depression. The patient is not nervous/anxious.      COVID 19 VACCINATION STATUS: Status post Pfizer x2 with booster December 2021   HISTORY OF CURRENT ILLNESS: From the original inpatient consult note:  Suzanne [name] is a 61 year old female from [place], [place] without significant past medical history with exception of obesity, remote history of gallstones, and remote history of mastitis.  The patient presented to the hospital on 10/26/2018 with a 3-week history of worsening shortness of breath and lower extremity edema.  The patient reported that her shortness of breath worsened with exertion.  Her lower extremity edema did not improve with elevation.  She also had an intermittent pr oductive  cough with rare sputum production.  The patient was noted to be anemic with a hemoglobin of 4.5 with a clumped platelets noted on admission.  Stool for occult blood was positive.  Patient received 2 units of packed red blood cells.    The patient reported a rash under her breast x1 week which has been  foul-smelling.  She also reported that she has some discomfort and thickening/hardness of her right breast for several months.  She states that her right breast is much larger than her left.  She notes some occasional bloody discharge from her breast once in a while.  A chest x-ray on admission showed diffuse bilateral interstitial opacity of unknown chronicity, findings could be related to interstitial inflammatory process, atypical/viral pneumonia, or possible metastatic disease.  There are also sclerotic and lytic lesions within the clavicles, bilateral ribs, shoulders which was concerning for diffuse few skeletal metastatic disease.    This prompted a CT scan of the chest with contrast which showed bilateral axillary and mediastinal adenopathy, borderline hilar lymph nodes bilaterally, findings likely flecked metastatic lymphadenopathy, moderate bilateral pleural effusions, extensive interstitial thickening and reticulonodular opacities throughout the lungs, cannot exclude lymphangitic spread of tumor, diffuse skeletal metastases.  The patient's subsequent history is as detailed below.   PAST MEDICAL HISTORY: Past Medical History:  Diagnosis Date   Cancer [facility])    Family history of breast cancer    Family history of colon cancer    Family history of melanoma    Family history of prostate cancer    Gallstones    Glaucoma    Mastitis    reports history of recurrent mastitis   Metastatic breast cancer ([place])    Obesity     PAST SURGICAL HISTORY: Past Surgical History:  Procedure Laterality Date   GLAUCOMA SURGERY  late 1990's   IR THORACENTESIS ASP PLEURAL SPACE W/IMG GUIDE  10/28/2018   IR THORACENTESIS ASP PLEURAL SPACE W/IMG GUIDE  10/29/2018   OPEN REDUCTION INTERNAL FIXATION (ORIF) DISTAL RADIAL FRACTURE Left 01/09/2019   Procedure: OPEN REDUCTION INTERNAL FIXATION (ORIF) HUMERAL FRACTURE;  Surgeon: Netta Cedars, MD;  Location: [facility];  Service: Orthopedics;  Laterality: Left;     FAMILY HISTORY: Family History  Problem Relation Age of Onset   Breast cancer Sister        in her 82's-70s   Heart disease Brother        CABG   Heart disease Brother        CABG   Skin cancer Brother        melanoma   Colon cancer Cousin    Heart attack Mother    Heart attack Father    Prostate cancer Brother   Patient's father passed away at age 58, and her mother at 100, both from heart attacks. The patient has 8 siblings, 6 brothers and 1 sister.  Her sister was diagnosed with breast cancer. One brother has prostate cancer, another brother had a melanoma removed.    GYNECOLOGIC HISTORY:  Menarche: 61 years old Age at first live birth: 61 years old [place] P 1 LMP age 34 Contraceptive: used for 2-3 years with no problems HRT never used  Hysterectomy? no   SOCIAL HISTORY:  Aowyn worked for an Engineer, manufacturing systems. She is now on disability. She is widowed. She lives at home with her daughter.  Suzanne [name], 37, who is a Psychologist, occupational at [employer] center.  The patient has no grandchildren. She is not a Ambulance person.  ADVANCED DIRECTIVES: not on file; she has the paperwork already. She intends to name her daughter as her HCPOA.   HEALTH MAINTENANCE: Social History   Tobacco Use   Smoking status: Never   Smokeless tobacco: Never   Tobacco comments:    second hand smoke exposure  Vaping Use   Vaping Use: Never used  Substance Use Topics   Alcohol use: No   Drug use: No    Colonoscopy:   PAP:   Bone density:  Mammography:   No Known Allergies  Current Outpatient Medications  Medication Sig Dispense Refill   anastrozole (ARIMIDEX) 1 MG tablet Take 1 tablet (1 mg total) by mouth daily. 90 tablet 4   antiseptic oral rinse (BIOTENE) LIQD 15 mLs by Mouth Rinse route as needed for dry mouth. (Patient not taking: No sig reported)     Blood Pressure Monitor MISC Medium sized cuff. Use to check blood pressure twice per day and more often if not feeling well. I10.0  1 each 0   calcium-vitamin D (OSCAL WITH D) 500-200 MG-UNIT tablet Take 1 tablet by mouth 3 (three) times daily. (Patient not taking: Reported on 06/05/2021) 90 tablet 1   cetirizine (ZYRTEC) 10 MG tablet Take 1 tablet (10 mg total) by mouth daily. 90 tablet 3   cyanocobalamin ([employer] VITAMIN B12) 2000 MCG tablet Take 1 tablet (2,000 mcg total) by mouth daily. 90 tablet 4   famotidine (PEPCID) 20 MG tablet Take 1 tablet (20 mg total) by mouth 2 (two) times daily as needed for heartburn or indigestion. 30 tablet 3   folic acid (FOLVITE) 1 MG tablet Take 1 tablet (1 mg total) by mouth daily. 90 tablet 4   furosemide (LASIX) 20 MG tablet Take 1 tablet (20 mg total) by mouth daily. 90 tablet 4   ketoconazole (NIZORAL) 2 % cream APPLY TO THE AFFECTED AREA(S) DAILY AS NEEDED (Patient not taking: Reported on 06/05/2021) 60 g 6   nystatin (MYCOSTATIN/NYSTOP) powder Apply 1 application topically as needed. (Patient not taking: Reported on 06/05/2021) 15 g 1   OYSTER SHELL CALCIUM PLUS D 500-5 MG-MCG TABS TAKE ONE TABLET BY MOUTH EVERY MORNING and TAKE ONE TABLET BY MOUTH AT NOON and TAKE ONE TABLET BY MOUTH EVERY EVENING 90 tablet 1   palbociclib (IBRANCE) 75 MG tablet TAKE 1 TABLET (75 MG TOTAL) BY MOUTH DAILY. TAKE FOR 21 DAYS ON, 7 DAYS OFF, REPEAT EVERY 28 DAYS 21 tablet 6   polyethylene glycol (MIRALAX / GLYCOLAX) 17 g packet Take 17 g by mouth as needed. 14 each 6   sertraline (ZOLOFT) 50 MG tablet TAKE 1 AND 1/2 TABLETS BY MOUTH EVERYDAY AT BEDTIME 135 tablet 0   ZOFRAN 8 MG tablet Take 1 tablet (8 mg total) by mouth every 8 (eight) hours. 20 tablet 1   No current facility-administered medications for this visit.    OBJECTIVE: White woman examined in a wheelchair  Vitals:   06/27/21 1326  BP: 108/68  Pulse: (!) 102  Resp: 18  Temp: 97.7 F (36.5 C)  SpO2: 95%       Body mass index is 38.86 kg/m.   Wt Readings from Last 3 Encounters:  06/27/21 192 lb 6.4 oz (87.3 kg)  05/02/21 187 lb 9.6 oz  (85.1 kg)  03/06/21 194 lb 3.2 oz (88.1 kg)  ECOG FS: 2 GENERAL: Patient is a well appearing female examined in wheelchair HEENT:  Sclerae anicteric.  Oropharynx clear and moist. No ulcerations or evidence of oropharyngeal candidiasis.  Neck is supple.  NODES:  No cervical, supraclavicular, or axillary lymphadenopathy palpated.  BREAST EXAM:  Deferred. LUNGS:  Clear to auscultation bilaterally.  No wheezes or rhonchi. HEART:  Regular rate and rhythm. No murmur appreciated. ABDOMEN:  Soft, nontender.  Positive, normoactive bowel sounds. No organomegaly palpated. MSK:  No focal spinal tenderness to palpation. Full range of motion bilaterally in the upper extremities. EXTREMITIES:  No peripheral edema.   SKIN:  Clear with no obvious rashes or skin changes.  NEURO:  Nonfocal. Well oriented.  Appropriate affect.    LAB RESULTS:  CMP     Component Value Date/Time   NA 142 06/27/2021 1301   K 3.6 06/27/2021 1301   CL 104 06/27/2021 1301   CO2 33 (H) 06/27/2021 1301   GLUCOSE 100 (H) 06/27/2021 1301   BUN 20 06/27/2021 1301   CREATININE 0.99 06/27/2021 1301   CALCIUM 9.1 06/27/2021 1301   PROT 6.6 06/27/2021 1301   ALBUMIN 3.8 06/27/2021 1301   AST 14 (L) 06/27/2021 1301   ALT 7 06/27/2021 1301   ALKPHOS 101 06/27/2021 1301   BILITOT 0.4 06/27/2021 1301   GFRNONAA >60 06/27/2021 1301   GFRAA >60 02/24/2020 1454   GFRAA >60 07/24/2019 1453    Lab Results  Component Value Date   WBC 2.8 (L) 06/27/2021   NEUTROABS 2.0 06/27/2021   HGB 7.6 (L) 06/27/2021   HCT 24.3 (L) 06/27/2021   MCV 103.8 (H) 06/27/2021   PLT 312 06/27/2021      Lab Results  Component Value Date   IRON 94 06/27/2021   TIBC 508 (H) 06/27/2021   IRONPCTSAT 19 06/27/2021   (Iron and TIBC)  Lab Results  Component Value Date   FERRITIN 12 06/27/2021     ELIGIBLE FOR AVAILABLE RESEARCH PROTOCOL: no   ASSESSMENT: 61 y.o. [place], [place] woman presenting 10/26/2018 with right-sided  inflammatory breast cancer, stage IV, involving lungs, lymph nodes and bones, as follows:  (a) chest CT scan 10/26/2018 shows bilateral pleural effusions, possible lymphangitic spread of tumor, diffuse bony metastatic disease, and significant axillary mediastinal and hilar adenopathy  (b) bone scan 10/27/2018 is a "near [place]" consistent with widespread bony metastatic disease  (c) head CT with and without contrast 10/30/2018 shows no intracranial metastatic disease, multiple calvarial lesions  (d) CA-27-29 on 10/27/2018 was 1810.4  (1) pleural fluid from right thoracentesis 10/28/2018 confirms malignant cells consistent with a breast primary, strongly estrogen and progesterone receptor positive, HER-2 not amplified, with an MIB-1 of 2%  (2) anastrozole started 10/29/2018  (a) palbociclib started 11/13/2018 at 100 mg dose  (b) denosumab/xgeva started 11/13/2018   (C) palbociclib dose started  (3) associated problems:  (a) hypoxia secondary to effusions  (b) pain from bone lesions and left humeral and vertebral compression fractures  (c) right upper extremity lymphedema  (d) poor venous access  (4) genetics testing on 12/25/2018 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 STK11, TP53, TSC1, TSC2, and VHL.  The following genes were evaluated for sequence changes only: SDHA and HOXB13 c.251G>A variant only. The [facility] Melanoma  Panel analyzed the following 9 genes: BAP1 BRCA2 CDK4 CDKN2A  MITF POT1 PTEN RB1 Tp53.   (5) palliative radiation to the left humerus 07/20/2019 - 07/31/2019 Site Technique Total Dose (Gy) Dose per Fx (Gy) Completed Fx Beam Energies  Humerus, Left: Ext_Lt_humerus Complex 30/30 3 10/10 6X   (6) anemia requiring transfusion:   (a) B-12 level 134 FEB 2022: B-12 injectons started   (B) iron deficient, IV iron to start 2/6  PLAN: Elaysia is here for follow-up of her metastatic breast cancer.  We reviewed her recent scans from December 8 which show stability in her disease.  She will continue on her current treatment as she is tolerating this well.  She is tired and anemic with a hemoglobin of 7.6.  Her iron studies reveal iron deficiency.  I have placed orders for her to receive IV iron beginning next week.We discussed the risks and benefits in details she says she has received this in the past and tolerated it well.  Treva does have some issues with this abnormal sensation in her arm, they will let me know if it gets worse since it is chronic and likely related to her surgery.  I spent about 30 minutes in the visit with Berline Chough and her daughter discussing the above.  She is doing quite well.  They will return in 4 weeks for her next Xgeva injection she will see Dr. Chryl Heck at that time.  She will receive IV iron weekly as noted above.  Total encounter time: 30 minutes in face-to-face visit time, chart review, lab review, care coordination, order entry, and documentation of the encounter.   Wilber Bihari, NP 06/29/21 9:10 AM Medical Oncology and Hematology [facility] [place], [place] [address] Tel. [phone]    Fax. [phone]     *Total Encounter Time as defined by the Centers for Medicare and Medicaid Services includes, in addition to the face-to-face time of a patient visit (documented in the note above) non-face-to-face time: obtaining and reviewing outside history, ordering and reviewing medications, tests or  procedures, care coordination (communications with other health care professionals or caregivers) and documentation in the medical record.

## 2021-06-27 ENCOUNTER — Inpatient Hospital Stay: Payer: Medicaid Other

## 2021-06-27 ENCOUNTER — Inpatient Hospital Stay (HOSPITAL_BASED_OUTPATIENT_CLINIC_OR_DEPARTMENT_OTHER): Payer: Medicaid Other | Admitting: Adult Health

## 2021-06-27 ENCOUNTER — Other Ambulatory Visit: Payer: Self-pay

## 2021-06-27 VITALS — BP 108/68 | HR 102 | Temp 97.7°F | Resp 18 | Wt 192.4 lb

## 2021-06-27 DIAGNOSIS — C7802 Secondary malignant neoplasm of left lung: Secondary | ICD-10-CM

## 2021-06-27 DIAGNOSIS — C7951 Secondary malignant neoplasm of bone: Secondary | ICD-10-CM | POA: Diagnosis not present

## 2021-06-27 DIAGNOSIS — G893 Neoplasm related pain (acute) (chronic): Secondary | ICD-10-CM

## 2021-06-27 DIAGNOSIS — D649 Anemia, unspecified: Secondary | ICD-10-CM

## 2021-06-27 DIAGNOSIS — C7801 Secondary malignant neoplasm of right lung: Secondary | ICD-10-CM | POA: Diagnosis not present

## 2021-06-27 DIAGNOSIS — C50911 Malignant neoplasm of unspecified site of right female breast: Secondary | ICD-10-CM

## 2021-06-27 DIAGNOSIS — Z170 Estrogen receptor positive status [ER+]: Secondary | ICD-10-CM | POA: Diagnosis not present

## 2021-06-27 DIAGNOSIS — J90 Pleural effusion, not elsewhere classified: Secondary | ICD-10-CM

## 2021-06-27 DIAGNOSIS — R0602 Shortness of breath: Secondary | ICD-10-CM

## 2021-06-27 DIAGNOSIS — C50811 Malignant neoplasm of overlapping sites of right female breast: Secondary | ICD-10-CM

## 2021-06-27 DIAGNOSIS — D518 Other vitamin B12 deficiency anemias: Secondary | ICD-10-CM

## 2021-06-27 LAB — CMP (CANCER CENTER ONLY)
ALT: 7 U/L (ref 0–44)
AST: 14 U/L — ABNORMAL LOW (ref 15–41)
Albumin: 3.8 g/dL (ref 3.5–5.0)
Alkaline Phosphatase: 101 U/L (ref 38–126)
Anion gap: 5 (ref 5–15)
BUN: 20 mg/dL (ref 8–23)
CO2: 33 mmol/L — ABNORMAL HIGH (ref 22–32)
Calcium: 9.1 mg/dL (ref 8.9–10.3)
Chloride: 104 mmol/L (ref 98–111)
Creatinine: 0.99 mg/dL (ref 0.44–1.00)
GFR, Estimated: >60 mL/min (ref >60)
Glucose, Bld: 100 mg/dL — ABNORMAL HIGH (ref 70–99)
Potassium: 3.6 mmol/L (ref 3.5–5.1)
Sodium: 142 mmol/L (ref 135–145)
Total Bilirubin: 0.4 mg/dL (ref 0.3–1.2)
Total Protein: 6.6 g/dL (ref 6.5–8.1)

## 2021-06-27 LAB — CBC WITH DIFFERENTIAL (CANCER CENTER ONLY)
Abs Immature Granulocytes: 0.01 10*3/uL (ref 0.00–0.07)
Basophils Absolute: 0.1 10*3/uL (ref 0.0–0.1)
Basophils Relative: 3 %
Eosinophils Absolute: 0.1 10*3/uL (ref 0.0–0.5)
Eosinophils Relative: 2 %
HCT: 24.3 % — ABNORMAL LOW (ref 36.0–46.0)
Hemoglobin: 7.6 g/dL — ABNORMAL LOW (ref 12.0–15.0)
Immature Granulocytes: 0 %
Lymphocytes Relative: 17 %
Lymphs Abs: 0.5 10*3/uL — ABNORMAL LOW (ref 0.7–4.0)
MCH: 32.5 pg (ref 26.0–34.0)
MCHC: 31.3 g/dL (ref 30.0–36.0)
MCV: 103.8 fL — ABNORMAL HIGH (ref 80.0–100.0)
Monocytes Absolute: 0.2 10*3/uL (ref 0.1–1.0)
Monocytes Relative: 6 %
Neutro Abs: 2 10*3/uL (ref 1.7–7.7)
Neutrophils Relative %: 72 %
Platelet Count: 312 10*3/uL (ref 150–400)
RBC: 2.34 MIL/uL — ABNORMAL LOW (ref 3.87–5.11)
RDW: 19.2 % — ABNORMAL HIGH (ref 11.5–15.5)
Smear Review: NORMAL
WBC Count: 2.8 10*3/uL — ABNORMAL LOW (ref 4.0–10.5)
nRBC: 0 % (ref 0.0–0.2)

## 2021-06-27 LAB — VITAMIN B12: Vitamin B-12: 2747 pg/mL — ABNORMAL HIGH (ref 180–914)

## 2021-06-27 LAB — IRON AND IRON BINDING CAPACITY (CC-WL,HP ONLY)
Iron: 94 ug/dL (ref 28–170)
Saturation Ratios: 19 % (ref 10.4–31.8)
TIBC: 508 ug/dL — ABNORMAL HIGH (ref 250–450)
UIBC: 414 ug/dL (ref 148–442)

## 2021-06-27 LAB — FERRITIN: Ferritin: 12 ng/mL (ref 11–307)

## 2021-06-27 MED ORDER — CYANOCOBALAMIN 1000 MCG/ML IJ SOLN
1000.0000 ug | Freq: Once | INTRAMUSCULAR | Status: AC
  Administered 2021-06-27: 1000 ug via INTRAMUSCULAR
  Filled 2021-06-27: qty 1

## 2021-06-27 MED ORDER — DENOSUMAB 120 MG/1.7ML ~~LOC~~ SOLN
120.0000 mg | Freq: Once | SUBCUTANEOUS | Status: AC
  Administered 2021-06-27: 120 mg via SUBCUTANEOUS
  Filled 2021-06-27: qty 1.7

## 2021-06-28 DIAGNOSIS — C50811 Malignant neoplasm of overlapping sites of right female breast: Secondary | ICD-10-CM | POA: Diagnosis not present

## 2021-06-28 DIAGNOSIS — C7801 Secondary malignant neoplasm of right lung: Secondary | ICD-10-CM | POA: Diagnosis not present

## 2021-06-28 DIAGNOSIS — S22000A Wedge compression fracture of unspecified thoracic vertebra, initial encounter for closed fracture: Secondary | ICD-10-CM | POA: Diagnosis not present

## 2021-06-28 DIAGNOSIS — R0602 Shortness of breath: Secondary | ICD-10-CM | POA: Diagnosis not present

## 2021-06-28 DIAGNOSIS — J9621 Acute and chronic respiratory failure with hypoxia: Secondary | ICD-10-CM | POA: Diagnosis not present

## 2021-06-28 LAB — CANCER ANTIGEN 27.29: CA 27.29: 101.1 U/mL — ABNORMAL HIGH (ref 0.0–38.6)

## 2021-06-29 ENCOUNTER — Encounter: Payer: Self-pay | Admitting: Hematology and Oncology

## 2021-06-29 ENCOUNTER — Encounter: Payer: Self-pay | Admitting: Adult Health

## 2021-06-29 DIAGNOSIS — C50811 Malignant neoplasm of overlapping sites of right female breast: Secondary | ICD-10-CM | POA: Diagnosis not present

## 2021-06-30 ENCOUNTER — Other Ambulatory Visit: Payer: Self-pay

## 2021-06-30 DIAGNOSIS — D518 Other vitamin B12 deficiency anemias: Secondary | ICD-10-CM

## 2021-06-30 DIAGNOSIS — C7951 Secondary malignant neoplasm of bone: Secondary | ICD-10-CM

## 2021-07-03 ENCOUNTER — Other Ambulatory Visit: Payer: Self-pay

## 2021-07-03 ENCOUNTER — Inpatient Hospital Stay: Payer: Medicaid Other | Attending: Adult Health

## 2021-07-03 VITALS — BP 119/67 | HR 86 | Temp 98.6°F | Resp 18

## 2021-07-03 DIAGNOSIS — C50911 Malignant neoplasm of unspecified site of right female breast: Secondary | ICD-10-CM | POA: Insufficient documentation

## 2021-07-03 DIAGNOSIS — D649 Anemia, unspecified: Secondary | ICD-10-CM | POA: Insufficient documentation

## 2021-07-03 DIAGNOSIS — C7951 Secondary malignant neoplasm of bone: Secondary | ICD-10-CM | POA: Diagnosis present

## 2021-07-03 DIAGNOSIS — C50811 Malignant neoplasm of overlapping sites of right female breast: Secondary | ICD-10-CM | POA: Diagnosis not present

## 2021-07-03 DIAGNOSIS — C7802 Secondary malignant neoplasm of left lung: Secondary | ICD-10-CM

## 2021-07-03 DIAGNOSIS — G893 Neoplasm related pain (acute) (chronic): Secondary | ICD-10-CM

## 2021-07-03 DIAGNOSIS — C7801 Secondary malignant neoplasm of right lung: Secondary | ICD-10-CM

## 2021-07-03 DIAGNOSIS — E538 Deficiency of other specified B group vitamins: Secondary | ICD-10-CM | POA: Insufficient documentation

## 2021-07-03 DIAGNOSIS — Z170 Estrogen receptor positive status [ER+]: Secondary | ICD-10-CM

## 2021-07-03 MED ORDER — SODIUM CHLORIDE 0.9 % IV SOLN
510.0000 mg | Freq: Once | INTRAVENOUS | Status: AC
  Administered 2021-07-03: 510 mg via INTRAVENOUS
  Filled 2021-07-03: qty 510

## 2021-07-03 MED ORDER — SODIUM CHLORIDE 0.9 % IV SOLN: Freq: Once | INTRAVENOUS | Status: AC

## 2021-07-03 NOTE — Progress Notes (Signed)
Pt observed for 30 minutes post iron infusion. VSS. Pt transported to lobby by wheelchair with no complaints.

## 2021-07-03 NOTE — Progress Notes (Signed)
Ordering standing order. Gardiner Rhyme, RN

## 2021-07-03 NOTE — Patient Instructions (Signed)

## 2021-07-04 ENCOUNTER — Other Ambulatory Visit (HOSPITAL_COMMUNITY): Payer: Self-pay

## 2021-07-04 ENCOUNTER — Telehealth: Payer: Self-pay | Admitting: *Deleted

## 2021-07-04 DIAGNOSIS — C50811 Malignant neoplasm of overlapping sites of right female breast: Secondary | ICD-10-CM | POA: Diagnosis not present

## 2021-07-04 NOTE — Telephone Encounter (Signed)
Late entry from 07/03/2021.   This RN spoke with the patient's daughter per her concern with noted changes in her mother and " is there anything we need to do?"  [name] states her mother has some " dizziness when she gets up more then before" Pt is sleeping more during the day with some decreased appetite.  Area on her back " that was scabbed over is now open again and oozing "  Per concerns - daughter understands possible benefit with the iron infusions. She will monitor for worsening of symptoms and call if occur.

## 2021-07-05 ENCOUNTER — Other Ambulatory Visit: Payer: Self-pay

## 2021-07-05 ENCOUNTER — Other Ambulatory Visit: Payer: Self-pay | Admitting: Nurse Practitioner

## 2021-07-05 DIAGNOSIS — F329 Major depressive disorder, single episode, unspecified: Secondary | ICD-10-CM

## 2021-07-05 NOTE — Telephone Encounter (Signed)
Requested medication (s) are due for refill today:   Provider to review  Requested medication (s) are on the active medication list:   Yes  Future visit scheduled:   Yes in 1 mo. With Zelda   Last ordered: 04/08/2021 #135, 0 refills  Returned because it's a non delegated refill per protocol   Requested Prescriptions  Pending Prescriptions Disp Refills   sertraline (ZOLOFT) 50 MG tablet [Pharmacy Med Name: sertraline 50 mg tablet] 135 tablet 0    Sig: TAKE 1 AND 1/2 TABLETS BY MOUTH EVERYDAY AT BEDTIME     Not Delegated - Psychiatry:  Antidepressants - SSRI - sertraline Failed - 07/05/2021  8:59 AM      Failed - This refill cannot be delegated      Failed - AST in normal range and within 360 days    AST  Date Value Ref Range Status  06/27/2021 14 (L) 15 - 41 U/L Final          Failed - Valid encounter within last 6 months    Recent Outpatient Visits           7 months ago Primary hypertension   [place], [name], NP   12 months ago Encounter to establish care   [place], [name], NP   1 year ago Hypotension, unspecified hypotension type   [place], [place], NP   1 year ago Hypotension, unspecified hypotension type   [place], MD   1 year ago Cellulitis of toe of right foot   [place], MD       Future Appointments             In 1 month [name], NP [place] - ALT in normal range and within 360 days    ALT  Date Value Ref Range Status  06/27/2021 7 0 - 44 U/L Final          Passed - Completed PHQ-2 or PHQ-9 in the last 360 days

## 2021-07-06 ENCOUNTER — Other Ambulatory Visit: Payer: Self-pay | Admitting: Nurse Practitioner

## 2021-07-06 ENCOUNTER — Other Ambulatory Visit (HOSPITAL_COMMUNITY): Payer: Self-pay

## 2021-07-06 DIAGNOSIS — J302 Other seasonal allergic rhinitis: Secondary | ICD-10-CM

## 2021-07-06 DIAGNOSIS — C50811 Malignant neoplasm of overlapping sites of right female breast: Secondary | ICD-10-CM | POA: Diagnosis not present

## 2021-07-06 NOTE — Telephone Encounter (Signed)
Requested medications are due for refill today.  yes  Requested medications are on the active medications list.  yes  Last refill. 07/19/2020 #90 with 3 refills.   Future visit scheduled.   yes  Notes to clinic.  Prescription expired 03/27/2021    Requested Prescriptions  Pending Prescriptions Disp Refills   ALLERGY RELIEF CETIRIZINE 10 MG tablet [Pharmacy Med Name: Allergy Relief (cetirizine) 10 mg tablet] 90 tablet 3    Sig: TAKE ONE TABLET BY MOUTH DAILY     Ear, Nose, and Throat:  Antihistamines 2 Passed - 07/06/2021  2:24 PM      Passed - Cr in normal range and within 360 days    Creatinine  Date Value Ref Range Status  06/27/2021 0.99 0.44 - 1.00 mg/dL Final          Passed - Valid encounter within last 12 months    Recent Outpatient Visits           7 months ago Primary hypertension   [place], [name], NP   12 months ago Encounter to establish care   [place], [name], NP   1 year ago Hypotension, unspecified hypotension type   [place], [place], NP   1 year ago Hypotension, unspecified hypotension type   [place], MD   1 year ago Cellulitis of toe of right foot   [place], MD       Future Appointments             In 1 month [name], NP [place]

## 2021-07-07 ENCOUNTER — Other Ambulatory Visit: Payer: Self-pay

## 2021-07-07 ENCOUNTER — Other Ambulatory Visit: Payer: Self-pay | Admitting: *Deleted

## 2021-07-07 DIAGNOSIS — C50811 Malignant neoplasm of overlapping sites of right female breast: Secondary | ICD-10-CM | POA: Diagnosis not present

## 2021-07-07 NOTE — Patient Instructions (Signed)
Visit Information  Suzanne [name] was given information about Medicaid Managed Care team care coordination services as a part of their [place] Medicaid benefit. Suzanne [name] verbally consented to engagement with the [facility] Managed Care team.   If you are experiencing a medical emergency, please call 911 or report to your local emergency department or urgent care.   If you have a non-emergency medical problem during routine business hours, please contact your provider's office and ask to speak with a nurse.   For questions related to your [facility], please call: [phone] or visit the homepage here: [email or web address]  If you would like to schedule transportation through your [facility], please call the following number at least 2 days in advance of your appointment: [phone].   Call the [facility] at [phone], at any time, 24 hours a day, 7 days a week. If you are in danger or need immediate medical attention call 911.  If you would like help to quit smoking, call 1-800-QUIT-NOW 207-691-9943) OR Espaol: 1-855-Djelo-Ya (4-403-474-2595) o para ms informacin haga clic aqu or Text READY to 200-400 to register via text  Suzanne [name],   Please see education materials related to diet and preventing pressure injuries provided by MyChart link.  Patient verbalizes understanding of instructions and care plan provided today and agrees to view in [place]. Active MyChart status confirmed with patient.    Telephone follow up appointment with Managed Medicaid care management team member scheduled for:08/10/21 @ 10:30am  Suzanne Joiner RN, BSN [place] RN Care Coordinator   Following is a copy of your plan of care:  Care Plan : RN Care Manager Plan of Care  Updates made by Melissa Montane, RN since 07/07/2021  12:00 AM     Problem: Health Management Coordination Needs      Long-Range Goal: Development of Plan of Care for needs related to Health Management   Start Date: 01/26/2021  Expected End Date: 08/25/2021  Priority: High  Note:   Current Barriers:  Chronic Disease Management support and education needs related to Health Maintenance-Ms. Henriquez had an iron infusion last week and is feeling better, but continues to fatigue easily. She is scheduled for a 2nd infusion on 07/11/21. Her Oncologist retired and she will meet her new Oncologist 07/25/21. She has a sore on her back that she reports as healing and doing ok. She has made her provider aware. She denies any needs at this time.   RNCM Clinical Goal(s):  Patient will verbalize understanding of plan for management of Health Maintenance attend all scheduled medical appointments: Iron infusion on 07/11/21, Oncology 07/25/21, and PCP on 08/09/21 continue to work with RN Care Manager to address care management and care coordination needs related to Health Maintenance  through collaboration with RN Care manager, provider, and care team.   Interventions: Inter-disciplinary care team collaboration (see longitudinal plan of care) Evaluation of current treatment plan related to  self management and patient's adherence to plan as established by provider   Health Maintenance  (Status: Goal on Track (progressing): YES.) Evaluation of current treatment plan related to  Health Maintenance ,  self-management and patient's adherence to plan as established by provider. Discussed plans with patient for ongoing care management follow up and provided patient with direct contact information for care management team Reviewed scheduled/upcoming provider appointments including Iron infusion 07/11/21, Oncology 07/25/21, and PCP 08/09/21; Discussed plans with patient  for ongoing care management follow up and provided patient with direct contact information for care management  team; Reviewed medications-she continues to receive medications from [facility] and has contact information SDOH assessment Discussed the importance in a healthy diet, provided education Discussed skin care and provided preventing pressure wound education  Patient Goals/Self-Care Activities: Patient will self administer medications as prescribed Patient will attend all scheduled provider appointments Patient will call pharmacy for medication refills Patient will call provider office for new concerns or questions Patient will work with BSW to address care coordination needs and will continue to work with the clinical team to address health care and disease management related needs.

## 2021-07-07 NOTE — Patient Outreach (Signed)
Medicaid Managed Care   Nurse Care Manager Note  07/07/2021 Name:  Suzanne [name] MRN:  [id number] DOB:  Aug 07, 1960  Suzanne [name] is an 61 y.o. year old female who is [name] primary patient of Suzanne Pounds, NP.  The [facility] Managed Care Coordination team was consulted for assistance with:    Breast cancer  Suzanne [name] was given information about Medicaid Managed Care Coordination team services today. Suzanne [name] Patient agreed to services and verbal consent obtained.  Engaged with patient by telephone for follow up visit in response to provider referral for case management and/or care coordination services.   Assessments/Interventions:  Review of past medical history, allergies, medications, health status, including review of consultants reports, laboratory and other test data, was performed as part of comprehensive evaluation and provision of chronic care management services.  SDOH (Social Determinants of Health) assessments and interventions performed: SDOH Interventions    Flowsheet Row Most Recent Value  SDOH Interventions   Food Insecurity Interventions Intervention Not Indicated  Transportation Interventions Intervention Not Indicated       Care Plan  No Known Allergies  Medications Reviewed Today     Reviewed by Suzanne Montane, RN (Registered Nurse) on 07/07/21 at 1034  Med List Status: <None>   Medication Order Taking? Sig Documenting Provider Last Dose Status Informant  anastrozole (ARIMIDEX) 1 MG tablet [id number] Yes Take 1 tablet (1 mg total) by mouth daily. [name], Suzanne Dad, MD Taking Active   antiseptic oral rinse Suzanne [name]) LIQD [id number] No 15 mLs by Mouth Rinse route as needed for dry mouth.  Patient not taking: Reported on 07/14/2020   [provider] Not Taking Active   Blood Pressure Monitor MISC [id number]  Medium sized cuff. Use to check blood pressure twice per day and more often if not feeling well. I10.0 Suzanne Pounds, NP  Active    calcium-vitamin D (OSCAL WITH D) 500-200 MG-UNIT tablet [id number]  Take 1 tablet by mouth 3 (three) times daily. [name], Suzanne Dad, MD  Consider Medication Status and Discontinue (Patient Preference)            Med Note (Suzanne [name]   Mon Jun 05, 2021  1:19 PM) Taking twice daily  cetirizine (ZYRTEC) 10 MG tablet [id number] Yes Take 1 tablet (10 mg total) by mouth daily. Suzanne Pounds, NP Taking Active   cyanocobalamin ([employer] VITAMIN B12) 2000 MCG tablet [id number] Yes Take 1 tablet (2,000 mcg total) by mouth daily. [name], Suzanne Dad, MD Taking Active   famotidine (PEPCID) 20 MG tablet [id number] Yes Take 1 tablet (20 mg total) by mouth 2 (two) times daily as needed for heartburn or indigestion. [name], Suzanne Dad, MD Taking Active   folic acid (FOLVITE) 1 MG tablet [id number] Yes Take 1 tablet (1 mg total) by mouth daily. [name], Suzanne Dad, MD Taking Active   furosemide (LASIX) 20 MG tablet [id number] Yes Take 1 tablet (20 mg total) by mouth daily. [name], Suzanne Dad, MD Taking Active   ketoconazole (NIZORAL) 2 % cream [id number] No APPLY TO THE AFFECTED AREA(S) DAILY AS NEEDED  Patient not taking: Reported on 06/05/2021   [name], Suzanne Dad, MD Not Taking Active   nystatin (MYCOSTATIN/NYSTOP) powder [id number] No Apply 1 application topically as needed.  Patient not taking: Reported on 06/05/2021   Gardenia Phlegm, NP Not Taking Active   OYSTER SHELL CALCIUM PLUS D 500-5 MG-MCG TABS [id number] Yes TAKE ONE TABLET BY MOUTH EVERY MORNING and TAKE ONE TABLET BY  MOUTH AT NOON and TAKE ONE TABLET BY MOUTH EVERY EVENING [name], Suzanne Dad, MD Taking Active            Med Note (Kyilee Gregg [name]   Mon Jun 05, 2021  1:21 PM) Taking twice daily  palbociclib (IBRANCE) 75 MG tablet [id number] Yes TAKE 1 TABLET (75 MG TOTAL) BY MOUTH DAILY. TAKE FOR 21 DAYS ON, 7 DAYS OFF, REPEAT EVERY 28 DAYS [name], Suzanne Dad, MD Taking Active   polyethylene glycol (MIRALAX / GLYCOLAX) 17 g packet [id number] Yes Take 17  g by mouth as needed. [name], Suzanne Dad, MD Taking Active   sertraline (ZOLOFT) 50 MG tablet [id number] Yes TAKE 1 AND 1/2 TABLETS BY MOUTH EVERYDAY AT BEDTIME Charlott Rakes, MD Taking Active   ZOFRAN 8 MG tablet [id number] Yes Take 1 tablet (8 mg total) by mouth every 8 (eight) hours. Gardenia Phlegm, NP Taking Active   Med List Note [name], RPH-CPP 11/13/19 9179): [name] filled at [place]            Patient Active Problem List   Diagnosis Date Noted   B12 deficiency anemia 08/15/2020   Intractable back pain 02/03/2019   Thoracic compression fracture, closed, initial encounter ([place]) 02/03/2019   Essential hypertension 02/03/2019   Genetic testing 01/27/2019   Acute pharyngitis    Bacteremia    FTT (failure to thrive) in adult    Fracture closed, humerus, shaft 01/09/2019   Pneumonia 12/25/2018   Acute respiratory disorder in immunocompromised patient ([place]) 12/25/2018   Family history of breast cancer    Family history of prostate cancer    Family history of melanoma    Family history of colon cancer    Pressure injury of skin 11/24/2018   Chronic respiratory failure with hypoxia and hypercapnia ([place]) 11/19/2018   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   Hypophosphatemia 11/19/2018   Hypocalcemia 11/19/2018   Aspiration pneumonia (HCC) 11/19/2018   SOB (shortness of breath)    Palliative care by specialist    Carcinoma of breast, estrogen receptor positive, stage 4, right ([place]) 11/16/2018   Hypoalbuminemia 11/16/2018   Pleural effusion 11/14/2018   Goals of care, counseling/discussion 11/06/2018   Malignant neoplasm of overlapping sites of right breast in female, estrogen receptor positive ([place]) 10/31/2018   Lung metastasis ([place]) 10/31/2018   Pain from bone metastases ([place]) 10/31/2018   Morbid obesity with BMI of 40.0-44.9, adult ([place]) 10/31/2018   Bone injury    Pleural effusion, bilateral    Shortness of breath    Abnormal breast  finding    Hypokalemia    Breast skin changes    Macrocytic anemia    Bone metastasis (HCC)    Anemia, B12 deficiency 10/26/2018    Conditions to be addressed/monitored per PCP order:   breast cancer  Care Plan : RN Care Manager Plan of Care  Updates made by Suzanne Montane, RN since 07/07/2021 12:00 AM     Problem: Health Management Coordination Needs      Long-Range Goal: Development of Plan of Care for needs related to Health Management   Start Date: 01/26/2021  Expected End Date: 08/25/2021  Priority: High  Note:   Current Barriers:  Chronic Disease Management support and education needs related to Health Maintenance-Ms. Jeon had an iron infusion last week and is feeling better, but continues to fatigue easily. She is scheduled for [name] 2nd infusion on 07/11/21. Her Oncologist retired and she will meet her new Oncologist 07/25/21. She  has [name] sore on her back that she reports as healing and doing ok. She has made her provider aware. She denies any needs at this time.   RNCM Clinical Goal(s):  Patient will verbalize understanding of plan for management of Health Maintenance attend all scheduled medical appointments: Iron infusion on 07/11/21, Oncology 07/25/21, and PCP on 08/09/21 continue to work with RN Care Manager to address care management and care coordination needs related to Health Maintenance  through collaboration with RN Care manager, provider, and care team.   Interventions: Inter-disciplinary care team collaboration (see longitudinal plan of care) Evaluation of current treatment plan related to  self management and patient's adherence to plan as established by provider   Health Maintenance  (Status: Goal on Track (progressing): YES.) Evaluation of current treatment plan related to  Health Maintenance ,  self-management and patient's adherence to plan as established by provider. Discussed plans with patient for ongoing care management follow up and provided patient with direct  contact information for care management team Reviewed scheduled/upcoming provider appointments including Iron infusion 07/11/21, Oncology 07/25/21, and PCP 08/09/21; Discussed plans with patient for ongoing care management follow up and provided patient with direct contact information for care management team; Reviewed medications-she continues to receive medications from [facility] and has contact information SDOH assessment Discussed the importance in [name] healthy diet, provided education Discussed skin care and provided preventing pressure wound education  Patient Goals/Self-Care Activities: Patient will self administer medications as prescribed Patient will attend all scheduled provider appointments Patient will call pharmacy for medication refills Patient will call provider office for new concerns or questions Patient will work with BSW to address care coordination needs and will continue to work with the clinical team to address health care and disease management related needs.         Follow Up:  Patient agrees to Care Plan and Follow-up.  Plan: The Managed Medicaid care management team will reach out to the patient again over the next 30 days.  Date/time of next scheduled RN care management/care coordination outreach:  08/10/21 @ 10:30am  [name] RN, BSN [place] RN Care Coordinator

## 2021-07-10 ENCOUNTER — Ambulatory Visit: Payer: Medicaid Other

## 2021-07-10 DIAGNOSIS — C50811 Malignant neoplasm of overlapping sites of right female breast: Secondary | ICD-10-CM | POA: Diagnosis not present

## 2021-07-11 ENCOUNTER — Other Ambulatory Visit: Payer: Self-pay

## 2021-07-11 ENCOUNTER — Ambulatory Visit: Payer: Medicaid Other

## 2021-07-11 ENCOUNTER — Other Ambulatory Visit (HOSPITAL_COMMUNITY): Payer: Self-pay

## 2021-07-11 DIAGNOSIS — H43813 Vitreous degeneration, bilateral: Secondary | ICD-10-CM | POA: Diagnosis not present

## 2021-07-11 DIAGNOSIS — H0102A Squamous blepharitis right eye, upper and lower eyelids: Secondary | ICD-10-CM | POA: Diagnosis not present

## 2021-07-11 DIAGNOSIS — Z961 Presence of intraocular lens: Secondary | ICD-10-CM | POA: Diagnosis not present

## 2021-07-11 DIAGNOSIS — H0102B Squamous blepharitis left eye, upper and lower eyelids: Secondary | ICD-10-CM | POA: Diagnosis not present

## 2021-07-11 DIAGNOSIS — H40013 Open angle with borderline findings, low risk, bilateral: Secondary | ICD-10-CM | POA: Diagnosis not present

## 2021-07-12 ENCOUNTER — Other Ambulatory Visit (HOSPITAL_COMMUNITY): Payer: Self-pay

## 2021-07-13 ENCOUNTER — Other Ambulatory Visit: Payer: Self-pay | Admitting: *Deleted

## 2021-07-13 DIAGNOSIS — C50811 Malignant neoplasm of overlapping sites of right female breast: Secondary | ICD-10-CM

## 2021-07-13 DIAGNOSIS — D518 Other vitamin B12 deficiency anemias: Secondary | ICD-10-CM

## 2021-07-13 DIAGNOSIS — Z170 Estrogen receptor positive status [ER+]: Secondary | ICD-10-CM

## 2021-07-13 DIAGNOSIS — D539 Nutritional anemia, unspecified: Secondary | ICD-10-CM

## 2021-07-14 ENCOUNTER — Inpatient Hospital Stay: Payer: Medicaid Other

## 2021-07-14 ENCOUNTER — Other Ambulatory Visit: Payer: Self-pay

## 2021-07-14 VITALS — BP 121/51 | HR 79 | Temp 98.5°F | Resp 18

## 2021-07-14 DIAGNOSIS — C7951 Secondary malignant neoplasm of bone: Secondary | ICD-10-CM

## 2021-07-14 DIAGNOSIS — Z170 Estrogen receptor positive status [ER+]: Secondary | ICD-10-CM

## 2021-07-14 DIAGNOSIS — C7801 Secondary malignant neoplasm of right lung: Secondary | ICD-10-CM

## 2021-07-14 DIAGNOSIS — C50811 Malignant neoplasm of overlapping sites of right female breast: Secondary | ICD-10-CM

## 2021-07-14 DIAGNOSIS — D539 Nutritional anemia, unspecified: Secondary | ICD-10-CM

## 2021-07-14 DIAGNOSIS — D518 Other vitamin B12 deficiency anemias: Secondary | ICD-10-CM

## 2021-07-14 LAB — CMP (CANCER CENTER ONLY)
ALT: 8 U/L (ref 0–44)
AST: 17 U/L (ref 15–41)
Albumin: 3.8 g/dL (ref 3.5–5.0)
Alkaline Phosphatase: 92 U/L (ref 38–126)
Anion gap: 5 (ref 5–15)
BUN: 12 mg/dL (ref 8–23)
CO2: 32 mmol/L (ref 22–32)
Calcium: 9.4 mg/dL (ref 8.9–10.3)
Chloride: 104 mmol/L (ref 98–111)
Creatinine: 0.8 mg/dL (ref 0.44–1.00)
GFR, Estimated: >60 mL/min (ref >60)
Glucose, Bld: 96 mg/dL (ref 70–99)
Potassium: 3.7 mmol/L (ref 3.5–5.1)
Sodium: 141 mmol/L (ref 135–145)
Total Bilirubin: 0.6 mg/dL (ref 0.3–1.2)
Total Protein: 6.3 g/dL — ABNORMAL LOW (ref 6.5–8.1)

## 2021-07-14 LAB — CBC WITH DIFFERENTIAL (CANCER CENTER ONLY)
Abs Immature Granulocytes: 0.02 10*3/uL (ref 0.00–0.07)
Basophils Absolute: 0.1 10*3/uL (ref 0.0–0.1)
Basophils Relative: 2 %
Eosinophils Absolute: 0 10*3/uL (ref 0.0–0.5)
Eosinophils Relative: 1 %
HCT: 23 % — ABNORMAL LOW (ref 36.0–46.0)
Hemoglobin: 7.3 g/dL — ABNORMAL LOW (ref 12.0–15.0)
Immature Granulocytes: 1 %
Lymphocytes Relative: 21 %
Lymphs Abs: 0.5 10*3/uL — ABNORMAL LOW (ref 0.7–4.0)
MCH: 36.1 pg — ABNORMAL HIGH (ref 26.0–34.0)
MCHC: 31.7 g/dL (ref 30.0–36.0)
MCV: 113.9 fL — ABNORMAL HIGH (ref 80.0–100.0)
Monocytes Absolute: 0.3 10*3/uL (ref 0.1–1.0)
Monocytes Relative: 12 %
Neutro Abs: 1.4 10*3/uL — ABNORMAL LOW (ref 1.7–7.7)
Neutrophils Relative %: 63 %
Platelet Count: 172 10*3/uL (ref 150–400)
RBC: 2.02 MIL/uL — ABNORMAL LOW (ref 3.87–5.11)
RDW: 25.2 % — ABNORMAL HIGH (ref 11.5–15.5)
WBC Count: 2.2 10*3/uL — ABNORMAL LOW (ref 4.0–10.5)
nRBC: 2.3 % — ABNORMAL HIGH (ref 0.0–0.2)

## 2021-07-14 LAB — FERRITIN: Ferritin: 201 ng/mL (ref 11–307)

## 2021-07-14 LAB — SAMPLE TO BLOOD BANK

## 2021-07-14 LAB — IRON AND IRON BINDING CAPACITY (CC-WL,HP ONLY)
Iron: 77 ug/dL (ref 28–170)
Saturation Ratios: 17 % (ref 10.4–31.8)
TIBC: 444 ug/dL (ref 250–450)
UIBC: 367 ug/dL (ref 148–442)

## 2021-07-14 MED ORDER — FAMOTIDINE IN NACL 20-0.9 MG/50ML-% IV SOLN
20.0000 mg | Freq: Once | INTRAVENOUS | Status: AC | PRN
  Administered 2021-07-14: 20 mg via INTRAVENOUS

## 2021-07-14 MED ORDER — SODIUM CHLORIDE 0.9 % IV SOLN: Freq: Once | INTRAVENOUS | Status: AC

## 2021-07-14 MED ORDER — SODIUM CHLORIDE 0.9 % IV SOLN: Freq: Once | INTRAVENOUS | Status: DC | PRN

## 2021-07-14 MED ORDER — SODIUM CHLORIDE 0.9 % IV SOLN
510.0000 mg | Freq: Once | INTRAVENOUS | Status: AC
  Administered 2021-07-14: 510 mg via INTRAVENOUS
  Filled 2021-07-14: qty 510

## 2021-07-14 NOTE — Patient Instructions (Signed)

## 2021-07-14 NOTE — Progress Notes (Signed)
Hypersensitivity Reaction note  Date of event: 07/14/21 Time of event: 1432 Generic name of drug involved: ferumoxytol Name of provider notified of the hypersensitivity reaction: Praveena Iruku Was agent that likely caused hypersensitivity reaction added to Allergies List within EMR? yes Chain of events including reaction signs/symptoms, treatment administered, and outcome (e.g., drug resumed; drug discontinued; sent to Emergency Department; etc.)  6 minutes into into her Feraheme infusion, Patient reports facial flushing. Feraheme paused. Normal saline wide open started (see MAR) along with pepcid. Pt stated that she was feeling better after infusion paused. VSS throughout. Iron infusion restarted and patient tolerated without complaints. She was observed for 30 minutes after infusion.   Charlaine Dalton, RN 07/14/2021 3:25 PM

## 2021-07-17 ENCOUNTER — Encounter: Payer: Self-pay | Admitting: Hematology and Oncology

## 2021-07-17 DIAGNOSIS — C50811 Malignant neoplasm of overlapping sites of right female breast: Secondary | ICD-10-CM | POA: Diagnosis not present

## 2021-07-18 DIAGNOSIS — C50811 Malignant neoplasm of overlapping sites of right female breast: Secondary | ICD-10-CM | POA: Diagnosis not present

## 2021-07-20 DIAGNOSIS — C50811 Malignant neoplasm of overlapping sites of right female breast: Secondary | ICD-10-CM | POA: Diagnosis not present

## 2021-07-21 DIAGNOSIS — C50811 Malignant neoplasm of overlapping sites of right female breast: Secondary | ICD-10-CM | POA: Diagnosis not present

## 2021-07-25 ENCOUNTER — Encounter: Payer: Self-pay | Admitting: Hematology and Oncology

## 2021-07-25 ENCOUNTER — Inpatient Hospital Stay (HOSPITAL_BASED_OUTPATIENT_CLINIC_OR_DEPARTMENT_OTHER): Payer: Medicaid Other | Admitting: Hematology and Oncology

## 2021-07-25 ENCOUNTER — Other Ambulatory Visit: Payer: Self-pay

## 2021-07-25 ENCOUNTER — Inpatient Hospital Stay: Payer: Medicaid Other

## 2021-07-25 VITALS — BP 111/46 | HR 90 | Temp 97.2°F | Resp 18 | Wt 186.0 lb

## 2021-07-25 DIAGNOSIS — C50811 Malignant neoplasm of overlapping sites of right female breast: Secondary | ICD-10-CM

## 2021-07-25 DIAGNOSIS — D539 Nutritional anemia, unspecified: Secondary | ICD-10-CM

## 2021-07-25 DIAGNOSIS — R0602 Shortness of breath: Secondary | ICD-10-CM

## 2021-07-25 DIAGNOSIS — J90 Pleural effusion, not elsewhere classified: Secondary | ICD-10-CM | POA: Diagnosis not present

## 2021-07-25 DIAGNOSIS — Z170 Estrogen receptor positive status [ER+]: Secondary | ICD-10-CM

## 2021-07-25 DIAGNOSIS — C7951 Secondary malignant neoplasm of bone: Secondary | ICD-10-CM

## 2021-07-25 DIAGNOSIS — Z6841 Body Mass Index (BMI) 40.0 and over, adult: Secondary | ICD-10-CM

## 2021-07-25 DIAGNOSIS — G893 Neoplasm related pain (acute) (chronic): Secondary | ICD-10-CM | POA: Diagnosis not present

## 2021-07-25 DIAGNOSIS — C50911 Malignant neoplasm of unspecified site of right female breast: Secondary | ICD-10-CM

## 2021-07-25 DIAGNOSIS — C7801 Secondary malignant neoplasm of right lung: Secondary | ICD-10-CM

## 2021-07-25 DIAGNOSIS — N61 Mastitis without abscess: Secondary | ICD-10-CM | POA: Diagnosis not present

## 2021-07-25 DIAGNOSIS — D649 Anemia, unspecified: Secondary | ICD-10-CM

## 2021-07-25 LAB — CBC WITH DIFFERENTIAL/PLATELET
Abs Immature Granulocytes: 0.04 10*3/uL (ref 0.00–0.07)
Basophils Absolute: 0 10*3/uL (ref 0.0–0.1)
Basophils Relative: 1 %
Eosinophils Absolute: 0 10*3/uL (ref 0.0–0.5)
Eosinophils Relative: 1 %
HCT: 26.9 % — ABNORMAL LOW (ref 36.0–46.0)
Hemoglobin: 8.7 g/dL — ABNORMAL LOW (ref 12.0–15.0)
Immature Granulocytes: 1 %
Lymphocytes Relative: 8 %
Lymphs Abs: 0.3 10*3/uL — ABNORMAL LOW (ref 0.7–4.0)
MCH: 36.6 pg — ABNORMAL HIGH (ref 26.0–34.0)
MCHC: 32.3 g/dL (ref 30.0–36.0)
MCV: 113 fL — ABNORMAL HIGH (ref 80.0–100.0)
Monocytes Absolute: 0.1 10*3/uL (ref 0.1–1.0)
Monocytes Relative: 4 %
Neutro Abs: 3.3 10*3/uL (ref 1.7–7.7)
Neutrophils Relative %: 85 %
Platelets: 243 10*3/uL (ref 150–400)
RBC: 2.38 MIL/uL — ABNORMAL LOW (ref 3.87–5.11)
RDW: 20.2 % — ABNORMAL HIGH (ref 11.5–15.5)
WBC: 3.8 10*3/uL — ABNORMAL LOW (ref 4.0–10.5)
nRBC: 0 % (ref 0.0–0.2)

## 2021-07-25 LAB — COMPREHENSIVE METABOLIC PANEL
ALT: 8 U/L (ref 0–44)
AST: 14 U/L — ABNORMAL LOW (ref 15–41)
Albumin: 3.6 g/dL (ref 3.5–5.0)
Alkaline Phosphatase: 94 U/L (ref 38–126)
Anion gap: 6 (ref 5–15)
BUN: 19 mg/dL (ref 8–23)
CO2: 30 mmol/L (ref 22–32)
Calcium: 9.1 mg/dL (ref 8.9–10.3)
Chloride: 101 mmol/L (ref 98–111)
Creatinine, Ser: 0.99 mg/dL (ref 0.44–1.00)
GFR, Estimated: >60 mL/min (ref >60)
Glucose, Bld: 121 mg/dL — ABNORMAL HIGH (ref 70–99)
Potassium: 3.4 mmol/L — ABNORMAL LOW (ref 3.5–5.1)
Sodium: 137 mmol/L (ref 135–145)
Total Bilirubin: 0.6 mg/dL (ref 0.3–1.2)
Total Protein: 6.8 g/dL (ref 6.5–8.1)

## 2021-07-25 LAB — VITAMIN B12: Vitamin B-12: 3512 pg/mL — ABNORMAL HIGH (ref 180–914)

## 2021-07-25 LAB — FERRITIN: Ferritin: 547 ng/mL — ABNORMAL HIGH (ref 11–307)

## 2021-07-25 MED ORDER — AMOXICILLIN-POT CLAVULANATE 875-125 MG PO TABS: 1.0000 | ORAL_TABLET | Freq: Two times a day (BID) | ORAL | 0 refills | Status: DC

## 2021-07-25 MED ORDER — DENOSUMAB 120 MG/1.7ML ~~LOC~~ SOLN
120.0000 mg | Freq: Once | SUBCUTANEOUS | Status: AC
  Administered 2021-07-25: 120 mg via SUBCUTANEOUS
  Filled 2021-07-25: qty 1.7

## 2021-07-25 MED ORDER — CYANOCOBALAMIN 1000 MCG/ML IJ SOLN
1000.0000 ug | Freq: Once | INTRAMUSCULAR | Status: AC
  Administered 2021-07-25: 1000 ug via INTRAMUSCULAR
  Filled 2021-07-25: qty 1

## 2021-07-25 NOTE — Progress Notes (Signed)
[place]  Telephone:([phone] Fax:(336) [phone]   ID: CAMERYN SCHUM DOB: [birth year]  MR#: [id number]  SAY#:[id number]  Patient Care Team: [name], NP as PCP - General (Nurse Practitioner) Netta Cedars, MD as Consulting Physician (Orthopedic Surgery) Melissa Montane, RN as Case Manager Ethelda Chick as Social Worker Benay Pike, MD as Consulting Physician (Hematology and Oncology) OTHER MD:  CHIEF COMPLAINT: Stage IV estrogen receptor positive breast cancer  CURRENT TREATMENT: Anastrozole; Palbociclib; Xgeva  INTERVAL HISTORY:  Patient is here for follow-up with her daughter.  Since last visit, she has noticed some redness, pain and warmth of her right breast.  She has had prior episodes of mastitis according to the patient's daughter.  She has been taking B12 supplementation once a month and also takes a pill along with Xgeva monthly.  She has been compliant with her Ibrance and anastrozole as prescribed.  She tolerates them well.  No adverse effects reported.  No new bone pains or change in breathing or change in bowel habits.  Last imaging in December with stable disease. Rest of the pertinent 10 point ROS reviewed and negative.  Lab Results  Component Value Date   CA2729 101.1 (H) 06/27/2021   CA2729 83.7 (H) 05/30/2021   CA2729 97.7 (H) 05/02/2021   CA2729 79.7 (H) 03/31/2021   CA2729 82.7 (H) 03/06/2021      COVID 19 VACCINATION STATUS: Status post Pfizer x2 with booster December 2021   HISTORY OF CURRENT ILLNESS: From the original inpatient consult note:  Ms. Stupka is a 61 year old female from [place], [place] without significant past medical history with exception of obesity, remote history of gallstones, and remote history of mastitis.  The patient presented to the hospital on 10/26/2018 with a 3-week history of worsening shortness of breath and lower extremity edema.  The patient reported that her shortness of breath  worsened with exertion.  Her lower extremity edema did not improve with elevation.  She also had an intermittent pr oductive cough with rare sputum production.  The patient was noted to be anemic with a hemoglobin of 4.5 with a clumped platelets noted on admission.  Stool for occult blood was positive.  Patient received 2 units of packed red blood cells.    The patient reported a rash under her breast x1 week which has been foul-smelling.  She also reported that she has some discomfort and thickening/hardness of her right breast for several months.  She states that her right breast is much larger than her left.  She notes some occasional bloody discharge from her breast once in a while.  A chest x-ray on admission showed diffuse bilateral interstitial opacity of unknown chronicity, findings could be related to interstitial inflammatory process, atypical/viral pneumonia, or possible metastatic disease.  There are also sclerotic and lytic lesions within the clavicles, bilateral ribs, shoulders which was concerning for diffuse few skeletal metastatic disease.    This prompted a CT scan of the chest with contrast which showed bilateral axillary and mediastinal adenopathy, borderline hilar lymph nodes bilaterally, findings likely flecked metastatic lymphadenopathy, moderate bilateral pleural effusions, extensive interstitial thickening and reticulonodular opacities throughout the lungs, cannot exclude lymphangitic spread of tumor, diffuse skeletal metastases.  The patient's subsequent history is as detailed below.   PAST MEDICAL HISTORY: Past Medical History:  Diagnosis Date   Cancer [facility])    Family history of breast cancer    Family history of colon cancer    Family history of melanoma  Family history of prostate cancer    Gallstones    Glaucoma    Mastitis    reports history of recurrent mastitis   Metastatic breast cancer ([place])    Obesity     PAST SURGICAL HISTORY: Past Surgical History:   Procedure Laterality Date   GLAUCOMA SURGERY  late 1990's   IR THORACENTESIS ASP PLEURAL SPACE W/IMG GUIDE  10/28/2018   IR THORACENTESIS ASP PLEURAL SPACE W/IMG GUIDE  10/29/2018   OPEN REDUCTION INTERNAL FIXATION (ORIF) DISTAL RADIAL FRACTURE Left 01/09/2019   Procedure: OPEN REDUCTION INTERNAL FIXATION (ORIF) HUMERAL FRACTURE;  Surgeon: Netta Cedars, MD;  Location: [facility];  Service: Orthopedics;  Laterality: Left;    FAMILY HISTORY: Family History  Problem Relation Age of Onset   Breast cancer Sister        in her 63's-70s   Heart disease Brother        CABG   Heart disease Brother        CABG   Skin cancer Brother        melanoma   Colon cancer Cousin    Heart attack Mother    Heart attack Father    Prostate cancer Brother   Patient's father passed away at age 32, and her mother at 94, both from heart attacks. The patient has 8 siblings, 6 brothers and 1 sister.  Her sister was diagnosed with breast cancer. One brother has prostate cancer, another brother had a melanoma removed.    GYNECOLOGIC HISTORY:  Menarche: 61 years old Age at first live birth: 61 years old [place] P 1 LMP age 89 Contraceptive: used for 2-3 years with no problems HRT never used  Hysterectomy? no   SOCIAL HISTORY:  Laporchia worked for an Engineer, manufacturing systems. She is now on disability. She is widowed. She lives at home with her daughter.  [name], 37, who is a Psychologist, occupational at [employer] center.  The patient has no grandchildren. She is not a Ambulance person.   ADVANCED DIRECTIVES: not on file; she has the paperwork already. She intends to name her daughter as her HCPOA.   HEALTH MAINTENANCE: Social History   Tobacco Use   Smoking status: Never   Smokeless tobacco: Never   Tobacco comments:    second hand smoke exposure  Vaping Use   Vaping Use: Never used  Substance Use Topics   Alcohol use: No   Drug use: No    Colonoscopy:   PAP:   Bone density:  Mammography:   Allergies  Allergen  Reactions   Ferumoxytol Other (See Comments)    Pt had hypersensitivity to Ferumoxytol. She had Facial Flushing. See note from 07/14/2021    Current Outpatient Medications  Medication Sig Dispense Refill   anastrozole (ARIMIDEX) 1 MG tablet Take 1 tablet (1 mg total) by mouth daily. 90 tablet 4   antiseptic oral rinse (BIOTENE) LIQD 15 mLs by Mouth Rinse route as needed for dry mouth. (Patient not taking: Reported on 07/14/2020)     Blood Pressure Monitor MISC Medium sized cuff. Use to check blood pressure twice per day and more often if not feeling well. I10.0 1 each 0   calcium-vitamin D (OSCAL WITH D) 500-200 MG-UNIT tablet Take 1 tablet by mouth 3 (three) times daily. 90 tablet 1   cetirizine (ALLERGY RELIEF CETIRIZINE) 10 MG tablet TAKE ONE TABLET BY MOUTH DAILY 30 tablet 0   cyanocobalamin ([employer] VITAMIN B12) 2000 MCG tablet Take 1 tablet (2,000 mcg total) by mouth daily.  90 tablet 4   famotidine (PEPCID) 20 MG tablet Take 1 tablet (20 mg total) by mouth 2 (two) times daily as needed for heartburn or indigestion. 30 tablet 3   folic acid (FOLVITE) 1 MG tablet Take 1 tablet (1 mg total) by mouth daily. 90 tablet 4   furosemide (LASIX) 20 MG tablet Take 1 tablet (20 mg total) by mouth daily. 90 tablet 4   ketoconazole (NIZORAL) 2 % cream APPLY TO THE AFFECTED AREA(S) DAILY AS NEEDED (Patient not taking: Reported on 06/05/2021) 60 g 6   nystatin (MYCOSTATIN/NYSTOP) powder Apply 1 application topically as needed. (Patient not taking: Reported on 06/05/2021) 15 g 1   OYSTER SHELL CALCIUM PLUS D 500-5 MG-MCG TABS TAKE ONE TABLET BY MOUTH EVERY MORNING and TAKE ONE TABLET BY MOUTH AT NOON and TAKE ONE TABLET BY MOUTH EVERY EVENING 90 tablet 1   palbociclib (IBRANCE) 75 MG tablet TAKE 1 TABLET (75 MG TOTAL) BY MOUTH DAILY. TAKE FOR 21 DAYS ON, 7 DAYS OFF, REPEAT EVERY 28 DAYS 21 tablet 6   polyethylene glycol (MIRALAX / GLYCOLAX) 17 g packet Take 17 g by mouth as needed. 14 each 6   sertraline (ZOLOFT) 50  MG tablet TAKE 1 AND 1/2 TABLETS BY MOUTH EVERYDAY AT BEDTIME 135 tablet 0   ZOFRAN 8 MG tablet Take 1 tablet (8 mg total) by mouth every 8 (eight) hours. 20 tablet 1   No current facility-administered medications for this visit.    OBJECTIVE: White woman examined in a wheelchair  Vitals:   07/25/21 1400  BP: (!) 111/46  Pulse: 90  Resp: 18  Temp: (!) 97.2 F (36.2 C)  SpO2: 99%       Body mass index is 37.57 kg/m.   Wt Readings from Last 3 Encounters:  07/25/21 186 lb (84.4 kg)  06/27/21 192 lb 6.4 oz (87.3 kg)  05/02/21 187 lb 9.6 oz (85.1 kg)  ECOG FS: 2 GENERAL: Patient is a well appearing female examined in wheelchair HEENT:  Sclerae anicteric.  Oropharynx clear and moist. No ulcerations or evidence of oropharyngeal candidiasis. Neck is supple.  NODES:  No cervical, supraclavicular, or axillary lymphadenopathy palpated.  BREAST EXAM: Right breast appears to have some cellulitis versus mastitis.  No nipple discharge noted LUNGS:  Clear to auscultation bilaterally.  No wheezes or rhonchi. HEART:  Regular rate and rhythm. No murmur appreciated. ABDOMEN:  Soft, nontender.  Positive, normoactive bowel sounds.  EXTREMITIES:  No peripheral edema.   NEURO:  Nonfocal. Well oriented.  Appropriate affect.  LAB RESULTS:  CMP     Component Value Date/Time   NA 141 07/14/2021 1340   K 3.7 07/14/2021 1340   CL 104 07/14/2021 1340   CO2 32 07/14/2021 1340   GLUCOSE 96 07/14/2021 1340   BUN 12 07/14/2021 1340   CREATININE 0.80 07/14/2021 1340   CALCIUM 9.4 07/14/2021 1340   PROT 6.3 (L) 07/14/2021 1340   ALBUMIN 3.8 07/14/2021 1340   AST 17 07/14/2021 1340   ALT 8 07/14/2021 1340   ALKPHOS 92 07/14/2021 1340   BILITOT 0.6 07/14/2021 1340   GFRNONAA >60 07/14/2021 1340   GFRAA >60 02/24/2020 1454   GFRAA >60 07/24/2019 1453    Lab Results  Component Value Date   WBC 3.8 (L) 07/25/2021   NEUTROABS PENDING 07/25/2021   HGB 8.7 (L) 07/25/2021   HCT 26.9 (L) 07/25/2021    MCV 113.0 (H) 07/25/2021   PLT 243 07/25/2021      Lab  Results  Component Value Date   IRON 77 07/14/2021   TIBC 444 07/14/2021   IRONPCTSAT 17 07/14/2021   (Iron and TIBC)  Lab Results  Component Value Date   FERRITIN 201 07/14/2021     ELIGIBLE FOR AVAILABLE RESEARCH PROTOCOL: no   ASSESSMENT: 61 y.o. [place], [place] woman presenting 10/26/2018 with right-sided inflammatory breast cancer, stage IV, involving lungs, lymph nodes and bones, as follows:  (a) chest CT scan 10/26/2018 shows bilateral pleural effusions, possible lymphangitic spread of tumor, diffuse bony metastatic disease, and significant axillary mediastinal and hilar adenopathy  (b) bone scan 10/27/2018 is a "near [place]" consistent with widespread bony metastatic disease  (c) head CT with and without contrast 10/30/2018 shows no intracranial metastatic disease, multiple calvarial lesions  (d) CA-27-29 on 10/27/2018 was 1810.4  (1) pleural fluid from right thoracentesis 10/28/2018 confirms malignant cells consistent with a breast primary, strongly estrogen and progesterone receptor positive, HER-2 not amplified, with an MIB-1 of 2%  (2) anastrozole started 10/29/2018  (a) palbociclib started 11/13/2018 at 100 mg dose  (b) denosumab/xgeva started 11/13/2018   (C) palbociclib dose started  (3) associated problems:  (a) hypoxia secondary to effusions  (b) pain from bone lesions and left humeral and vertebral compression fractures  (c) right upper extremity lymphedema  (d) poor venous access  (4) genetics testing on 12/25/2018 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 STK11, TP53, TSC1, TSC2, and VHL.  The following genes were evaluated for sequence changes only: SDHA and HOXB13 c.251G>A variant only. The [facility] Melanoma Panel analyzed the following 9 genes: BAP1 BRCA2 CDK4 CDKN2A MITF POT1 PTEN RB1 Tp53.   (5) palliative radiation to the left humerus 07/20/2019 - 07/31/2019 Site Technique Total Dose (Gy) Dose per Fx (Gy) Completed Fx Beam Energies  Humerus, Left: Ext_Lt_humerus Complex 30/30 3 10/10 6X   (6) anemia requiring transfusion:   (a) B-12 level 134 FEB 2022: B-12 injectons started   (B) iron deficient, IV iron to start 2/6  PLAN:  Celia is here for follow-up of her metastatic breast cancer.   Since last visit, she has received blood transfusion and has tolerated this well. She continues on Ibrance and anastrozole as well as Xgeva monthly. Last imaging in December with stable disease. I have recommended that she continue current medications, repeat imaging in March as ordered to assess response.  She is agreeable to these recommendations.  With regards to anemia, her hemoglobin is 8.7 g, no indication for transfusion.  B12 levels are very high hence they can stop oral supplementation.  She can continue B12 monthly injections for now.  Ferritin 2 weeks ago was normal. We will await the lab results today and make any additional recommendations.  For her mastitis/right breast cellulitis, have given her prescription for Augmentin 875 mg p.o. twice a day for 1 week.  She was encouraged to call us if she has worsening pain, fevers, chills, discharge or increasing redness.  She expressed understanding. Return to clinic next month to review imaging results and to discuss any additional recommendations.  Total encounter time:  40 minutes in face-to-face visit  time, chart review, lab review, care coordination, order entry, and documentation of the encounter.   *Total Encounter Time as defined by the Centers for Medicare and Medicaid Services includes, in addition to the face-to-face time of a patient visit (documented in the note above) non-face-to-face time: obtaining and reviewing outside history, ordering and reviewing medications, tests or procedures, care coordination (communications with other health care professionals or caregivers) and documentation in the medical record.  Benay Pike MD

## 2021-07-26 DIAGNOSIS — C7801 Secondary malignant neoplasm of right lung: Secondary | ICD-10-CM | POA: Diagnosis not present

## 2021-07-26 DIAGNOSIS — R0602 Shortness of breath: Secondary | ICD-10-CM | POA: Diagnosis not present

## 2021-07-26 DIAGNOSIS — J9621 Acute and chronic respiratory failure with hypoxia: Secondary | ICD-10-CM | POA: Diagnosis not present

## 2021-07-26 DIAGNOSIS — C50811 Malignant neoplasm of overlapping sites of right female breast: Secondary | ICD-10-CM | POA: Diagnosis not present

## 2021-07-26 DIAGNOSIS — S22000A Wedge compression fracture of unspecified thoracic vertebra, initial encounter for closed fracture: Secondary | ICD-10-CM | POA: Diagnosis not present

## 2021-07-26 LAB — CANCER ANTIGEN 27.29: CA 27.29: 131.8 U/mL — ABNORMAL HIGH (ref 0.0–38.6)

## 2021-07-28 ENCOUNTER — Telehealth: Payer: Self-pay | Admitting: *Deleted

## 2021-07-28 DIAGNOSIS — C50811 Malignant neoplasm of overlapping sites of right female breast: Secondary | ICD-10-CM | POA: Diagnosis not present

## 2021-07-28 NOTE — Telephone Encounter (Signed)
-----   Message from Benay Pike, MD sent at 07/26/2021  7:33 AM EST ----- ?Ferritin is 547, no need for iron supplementation. She should stop it. ?

## 2021-08-01 ENCOUNTER — Encounter: Payer: Self-pay | Admitting: Hematology and Oncology

## 2021-08-04 ENCOUNTER — Other Ambulatory Visit (HOSPITAL_COMMUNITY): Payer: Self-pay

## 2021-08-04 ENCOUNTER — Other Ambulatory Visit: Payer: Self-pay | Admitting: Hematology and Oncology

## 2021-08-04 DIAGNOSIS — C50811 Malignant neoplasm of overlapping sites of right female breast: Secondary | ICD-10-CM

## 2021-08-04 DIAGNOSIS — Z170 Estrogen receptor positive status [ER+]: Secondary | ICD-10-CM

## 2021-08-07 ENCOUNTER — Other Ambulatory Visit (HOSPITAL_COMMUNITY): Payer: Self-pay

## 2021-08-07 ENCOUNTER — Other Ambulatory Visit: Payer: Self-pay | Admitting: Hematology and Oncology

## 2021-08-07 DIAGNOSIS — C50811 Malignant neoplasm of overlapping sites of right female breast: Secondary | ICD-10-CM

## 2021-08-07 DIAGNOSIS — Z170 Estrogen receptor positive status [ER+]: Secondary | ICD-10-CM

## 2021-08-08 ENCOUNTER — Ambulatory Visit (HOSPITAL_COMMUNITY)
Admission: RE | Admit: 2021-08-08 | Discharge: 2021-08-08 | Disposition: A | Discharge status: Home / Self Care | Payer: Medicaid Other | Source: Ambulatory Visit | Attending: Hematology and Oncology | Admitting: Hematology and Oncology

## 2021-08-08 ENCOUNTER — Other Ambulatory Visit (HOSPITAL_COMMUNITY): Payer: Self-pay

## 2021-08-08 ENCOUNTER — Other Ambulatory Visit: Payer: Self-pay

## 2021-08-08 ENCOUNTER — Encounter: Payer: Self-pay | Admitting: Hematology and Oncology

## 2021-08-08 DIAGNOSIS — C50811 Malignant neoplasm of overlapping sites of right female breast: Secondary | ICD-10-CM | POA: Diagnosis present

## 2021-08-08 DIAGNOSIS — C50919 Malignant neoplasm of unspecified site of unspecified female breast: Secondary | ICD-10-CM | POA: Diagnosis not present

## 2021-08-08 DIAGNOSIS — C7951 Secondary malignant neoplasm of bone: Secondary | ICD-10-CM | POA: Diagnosis not present

## 2021-08-08 DIAGNOSIS — K429 Umbilical hernia without obstruction or gangrene: Secondary | ICD-10-CM | POA: Diagnosis not present

## 2021-08-08 DIAGNOSIS — J90 Pleural effusion, not elsewhere classified: Secondary | ICD-10-CM | POA: Diagnosis present

## 2021-08-08 DIAGNOSIS — Z170 Estrogen receptor positive status [ER+]: Secondary | ICD-10-CM | POA: Insufficient documentation

## 2021-08-08 DIAGNOSIS — G893 Neoplasm related pain (acute) (chronic): Secondary | ICD-10-CM | POA: Diagnosis present

## 2021-08-08 DIAGNOSIS — K828 Other specified diseases of gallbladder: Secondary | ICD-10-CM | POA: Diagnosis not present

## 2021-08-08 DIAGNOSIS — K8020 Calculus of gallbladder without cholecystitis without obstruction: Secondary | ICD-10-CM | POA: Diagnosis not present

## 2021-08-08 DIAGNOSIS — K449 Diaphragmatic hernia without obstruction or gangrene: Secondary | ICD-10-CM | POA: Diagnosis not present

## 2021-08-08 DIAGNOSIS — S2243XA Multiple fractures of ribs, bilateral, initial encounter for closed fracture: Secondary | ICD-10-CM | POA: Diagnosis not present

## 2021-08-08 MED ORDER — IOHEXOL 300 MG/ML  SOLN
100.0000 mL | Freq: Once | INTRAMUSCULAR | Status: AC | PRN
  Administered 2021-08-08: 100 mL via INTRAVENOUS

## 2021-08-08 MED ORDER — PALBOCICLIB 75 MG PO TABS
ORAL_TABLET | ORAL | 6 refills | Status: DC
  Filled 2021-08-08: qty 21, 28d supply, fill #0
  Filled 2021-09-01: qty 21, 21d supply, fill #1
  Filled 2021-09-25: qty 21, 21d supply, fill #2
  Filled 2021-10-27: qty 21, 28d supply, fill #3
  Filled 2021-11-23: qty 21, 28d supply, fill #4
  Filled 2022-02-22: qty 21, 28d supply, fill #5

## 2021-08-09 ENCOUNTER — Encounter: Payer: Self-pay | Admitting: Nurse Practitioner

## 2021-08-09 ENCOUNTER — Ambulatory Visit: Payer: Medicaid Other | Attending: Nurse Practitioner | Admitting: Nurse Practitioner

## 2021-08-09 VITALS — BP 138/84 | HR 88 | Resp 16

## 2021-08-09 DIAGNOSIS — I10 Essential (primary) hypertension: Secondary | ICD-10-CM

## 2021-08-09 DIAGNOSIS — J302 Other seasonal allergic rhinitis: Secondary | ICD-10-CM

## 2021-08-09 MED ORDER — CETIRIZINE HCL 10 MG PO TABS: 10.0000 mg | ORAL_TABLET | Freq: Every day | ORAL | 3 refills | Status: DC

## 2021-08-09 NOTE — Progress Notes (Signed)
? ?Assessment & Plan:  ?Suzanne [name] was seen today for hypertension. ? ?Diagnoses and all orders for this visit: ? ?Primary hypertension ?Continue lasix for BLE edema ? ?Seasonal allergies ?-     cetirizine (ALLERGY RELIEF CETIRIZINE) 10 MG tablet; Take 1 tablet (10 mg total) by mouth daily. ? ? ? ?Patient has been counseled on age-appropriate routine health concerns for screening and prevention. These are reviewed and up-to-date. Referrals have been placed accordingly. Immunizations are up-to-date or declined.    ?Subjective:  ? ?Chief Complaint  ?Patient presents with  ? Hypertension  ? ?HPI ?Suzanne [name] 61 y.o. female presents to office today for follow up to HTN.  ?She is accompanied by her daughter.  ?She has a PMH of anemia, right breast CA, seasonal allergies  ? ?Recently treated with abx for right breast cellulitis. Based on exam of breast today the cellulitis has resolved.  ? 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   ? ? ?Blood pressure is well controlled. She takes furosemide 20 mg daily for BLE edema ?Lab Results  ?Component Value Date  ? HGBA1C 5.3 10/27/2018  ?  ?BP Readings from Last 3 Encounters:  ?08/09/21 138/84  ?07/25/21 (!) 111/46  ?07/14/21 (!) 121/51  ?  ?Review of Systems  ?Constitutional:  Negative for fever, malaise/fatigue and weight loss.  ?HENT: Negative.  Negative for nosebleeds.   ?Eyes: Negative.  Negative for blurred vision, double vision and photophobia.  ?Respiratory: Negative.  Negative for cough and shortness of breath.   ?Cardiovascular: Negative.  Negative for chest pain, palpitations and leg swelling.  ?Gastrointestinal: Negative.  Negative for heartburn, nausea and vomiting.  ?Musculoskeletal: Negative.  Negative for myalgias.  ?Neurological: Negative.  Negative for dizziness, focal weakness, seizures and headaches.  ?Endo/Heme/Allergies:  Positive for environmental allergies.  ?Psychiatric/Behavioral: Negative.  Negative for suicidal  ideas.   ? ?Past Medical History:  ?Diagnosis Date  ? Cancer [facility])   ? Family history of breast cancer   ? Family history of colon cancer   ? Family history of melanoma   ? Family history of prostate cancer   ? Gallstones   ? Glaucoma   ? Mastitis   ? reports history of recurrent mastitis  ? Metastatic breast cancer ([place])   ? Obesity   ? ? ?Past Surgical History:  ?Procedure Laterality Date  ? GLAUCOMA SURGERY  late 1990's  ? IR THORACENTESIS ASP PLEURAL SPACE W/IMG GUIDE  10/28/2018  ? IR THORACENTESIS ASP PLEURAL SPACE W/IMG GUIDE  10/29/2018  ? OPEN REDUCTION INTERNAL FIXATION (ORIF) DISTAL RADIAL FRACTURE Left 01/09/2019  ? Procedure: OPEN REDUCTION INTERNAL FIXATION (ORIF) HUMERAL FRACTURE;  Surgeon: Netta Cedars, MD;  Location: [facility];  Service: Orthopedics;  Laterality: Left;  ? ? ?Family History  ?Problem Relation Age of Onset  ? Breast cancer Sister   ?     in her 25's-70s  ? Heart disease Brother   ?     CABG  ? Heart disease Brother   ?     CABG  ? Skin cancer Brother   ?     melanoma  ? Colon cancer Cousin   ? Heart attack Mother   ? Heart attack Father   ? Prostate cancer Brother   ? ? ?Social History Reviewed with no changes to be made today.  ? ?Outpatient Medications Prior to Visit  ?Medication Sig Dispense Refill  ? amoxicillin-clavulanate (AUGMENTIN) 875-125 MG tablet Take 1 tablet by mouth 2 (two)  times daily. 14 tablet 0  ? anastrozole (ARIMIDEX) 1 MG tablet Take 1 tablet (1 mg total) by mouth daily. 90 tablet 4  ? antiseptic oral rinse (BIOTENE) LIQD 15 mLs by Mouth Rinse route as needed for dry mouth. (Patient not taking: Reported on 07/14/2020)    ? Blood Pressure Monitor MISC Medium sized cuff. Use to check blood pressure twice per day and more often if not feeling well. I10.0 1 each 0  ? calcium-vitamin D (OSCAL WITH D) 500-200 MG-UNIT tablet Take 1 tablet by mouth 3 (three) times daily. 90 tablet 1  ? cyanocobalamin ([employer]S VITAMIN B12) 2000 MCG tablet Take 1 tablet (2,000 mcg total) by mouth  daily. 90 tablet 4  ? famotidine (PEPCID) 20 MG tablet Take 1 tablet (20 mg total) by mouth 2 (two) times daily as needed for heartburn or indigestion. 30 tablet 3  ? folic acid (FOLVITE) 1 MG tablet Take 1 tablet (1 mg total) by mouth daily. 90 tablet 4  ? furosemide (LASIX) 20 MG tablet Take 1 tablet (20 mg total) by mouth daily. 90 tablet 4  ? ketoconazole (NIZORAL) 2 % cream APPLY TO THE AFFECTED AREA(S) DAILY AS NEEDED (Patient not taking: Reported on 06/05/2021) 60 g 6  ? nystatin (MYCOSTATIN/NYSTOP) powder Apply 1 application topically as needed. (Patient not taking: Reported on 06/05/2021) 15 g 1  ? OYSTER SHELL CALCIUM PLUS D 500-5 MG-MCG TABS TAKE ONE TABLET BY MOUTH EVERY MORNING and TAKE ONE TABLET BY MOUTH AT NOON and TAKE ONE TABLET BY MOUTH EVERY EVENING 90 tablet 1  ? palbociclib (IBRANCE) 75 MG tablet TAKE 1 TABLET (75 MG TOTAL) BY MOUTH DAILY. TAKE FOR 21 DAYS ON, 7 DAYS OFF, REPEAT EVERY 28 DAYS 21 tablet 6  ? polyethylene glycol (MIRALAX / GLYCOLAX) 17 g packet Take 17 g by mouth as needed. 14 each 6  ? sertraline (ZOLOFT) 50 MG tablet TAKE 1 AND 1/2 TABLETS BY MOUTH EVERYDAY AT BEDTIME 135 tablet 0  ? ZOFRAN 8 MG tablet Take 1 tablet (8 mg total) by mouth every 8 (eight) hours. 20 tablet 1  ? cetirizine (ALLERGY RELIEF CETIRIZINE) 10 MG tablet TAKE ONE TABLET BY MOUTH DAILY 30 tablet 0  ? ?No facility-administered medications prior to visit.  ? ? ?Allergies  ?Allergen Reactions  ? Ferumoxytol Other (See Comments)  ?  Pt had hypersensitivity to Ferumoxytol. She had Facial Flushing. See note from 07/14/2021  ? ? ?   ?Objective:  ?  ?BP 138/84   Pulse 88   Resp 16   LMP 05/28/2013 (Within Months)   SpO2 99%  ?Wt Readings from Last 3 Encounters:  ?07/25/21 186 lb (84.4 kg)  ?06/27/21 192 lb 6.4 oz (87.3 kg)  ?05/02/21 187 lb 9.6 oz (85.1 kg)  ? ? ?Physical Exam ?Vitals and nursing note reviewed.  ?Constitutional:   ?   Appearance: She is well-developed.  ?HENT:  ?   Head: Normocephalic and  atraumatic.  ?Cardiovascular:  ?   Rate and Rhythm: Normal rate and regular rhythm.  ?   Heart sounds: Normal heart sounds. No murmur heard. ?  No friction rub. No gallop.  ?Pulmonary:  ?   Effort: Pulmonary effort is normal. No tachypnea or respiratory distress.  ?   Breath sounds: Normal breath sounds. No decreased breath sounds, wheezing, rhonchi or rales.  ?Chest:  ?   Chest wall: No tenderness.  ?Abdominal:  ?   General: Bowel sounds are normal.  ?   Palpations: Abdomen  is soft.  ?Musculoskeletal:     ?   General: Normal range of motion.  ?   Cervical back: Normal range of motion.  ?Skin: ?   General: Skin is warm and dry.  ?Neurological:  ?   Mental Status: She is alert and oriented to person, place, and time.  ?   Coordination: Coordination normal.  ?Psychiatric:     ?   Behavior: Behavior normal. Behavior is cooperative.     ?   Thought Content: Thought content normal.     ?   Judgment: Judgment normal.  ? ? ? ? ?   ?Patient has been counseled extensively about nutrition and exercise as well as the importance of adherence with medications and regular follow-up. The patient was given clear instructions to go to ER or return to medical center if symptoms don't improve, worsen or new problems develop. The patient verbalized understanding.  ? ?Follow-up: Return for PAP SMEAR.  ? ?[name], FNP-BC ?[place] ?[place], [place] ?[phone]   ?08/10/2021, 12:55 PM ?

## 2021-08-10 ENCOUNTER — Encounter: Payer: Self-pay | Admitting: Nurse Practitioner

## 2021-08-10 ENCOUNTER — Other Ambulatory Visit: Payer: Self-pay | Admitting: *Deleted

## 2021-08-10 DIAGNOSIS — C50811 Malignant neoplasm of overlapping sites of right female breast: Secondary | ICD-10-CM | POA: Diagnosis not present

## 2021-08-10 NOTE — Patient Instructions (Signed)
Visit Information ? ?[name]  - as a part of your Medicaid benefit, you are eligible for care management and care coordination services at no cost or copay. I was unable to reach you by phone today but would be happy to help you with your health related needs. Please feel free to call me @ [phone].  ? ?A member of the Managed Medicaid care management team will reach out to you again over the next 14 days.  ? ?[name] RN, BSN ?[facility] ?RN Care Coordinator ?  ?

## 2021-08-10 NOTE — Patient Outreach (Signed)
Care Coordination ? ?08/10/2021 ? ?[name] ?05-06-1961 ?[id number] ? ? ?Medicaid Managed Care  ? ?Unsuccessful Outreach Note ? ?08/10/2021 ?Name: Suzanne [name] MRN: [id number] DOB: [birth year] ? ?Referred by: [name], NP ?Reason for referral : High Risk Managed Medicaid (Unsuccessful RNCM follow up telephone outreach) ? ? ?An unsuccessful telephone outreach was attempted today. The patient was referred to the case management team for assistance with care management and care coordination.  ? ?Follow Up Plan: The care management team will reach out to the patient again over the next 14 days.  ? ?[name] RN, BSN ?[place] ?RN Care Coordinator ? ? ?

## 2021-08-11 DIAGNOSIS — C50811 Malignant neoplasm of overlapping sites of right female breast: Secondary | ICD-10-CM | POA: Diagnosis not present

## 2021-08-14 ENCOUNTER — Other Ambulatory Visit (HOSPITAL_COMMUNITY): Payer: Self-pay

## 2021-08-14 DIAGNOSIS — C50811 Malignant neoplasm of overlapping sites of right female breast: Secondary | ICD-10-CM | POA: Diagnosis not present

## 2021-08-15 DIAGNOSIS — C50811 Malignant neoplasm of overlapping sites of right female breast: Secondary | ICD-10-CM | POA: Diagnosis not present

## 2021-08-16 ENCOUNTER — Other Ambulatory Visit: Payer: Self-pay

## 2021-08-16 ENCOUNTER — Other Ambulatory Visit: Payer: Self-pay | Admitting: *Deleted

## 2021-08-16 DIAGNOSIS — C50811 Malignant neoplasm of overlapping sites of right female breast: Secondary | ICD-10-CM | POA: Diagnosis not present

## 2021-08-16 NOTE — Patient Instructions (Signed)
Visit Information ? ?Suzanne [name] was given information about Medicaid Managed Care team care coordination services as a part of their [place] Medicaid benefit. [name] verbally consented to engagement with the [facility] Managed Care team.  ? ?If you are experiencing a medical emergency, please call 911 or report to your local emergency department or urgent care.  ? ?If you have a non-emergency medical problem during routine business hours, please contact your provider's office and ask to speak with a nurse.  ? ?For questions related to your [facility], please call: [phone] or visit the homepage here: [email or web address] ? ?If you would like to schedule transportation through your [facility], please call the following number at least 2 days in advance of your appointment: [phone]. ? Rides for urgent appointments can also be made after hours by calling Member Services. ? ?Call the [place] at [phone], at any time, 24 hours a day, 7 days a week. If you are in danger or need immediate medical attention call 911. ? ?If you would like help to quit smoking, call 1-800-QUIT-NOW 707-328-0282) OR Espa?ol: 1-855-D?jelo-Ya 223 617 0704) o para m?s informaci?n haga clic aqu? or Text READY to 200-400 to register via text ? ?[name], ? ? ?Please see education materials related to using a walker provided by MyChart link. ? ?Patient verbalizes understanding of instructions and care plan provided today and agrees to view in [place]. Active MyChart status confirmed with patient.   ? ?Telephone follow up appointment with Managed Medicaid care management team member scheduled for:09/15/21 @ 11:15am ? ?[name] RN, BSN ?[place] ?RN Care Coordinator ? ? ?Following is a copy of your plan of care:  ?Care Plan : [place] of Care  ?Updates made by Melissa Montane, RN since 08/16/2021 12:00 AM  ?  ? ?Problem: Health Management Coordination Needs   ?  ? ?Long-Range Goal: Development of Plan of Care for needs related to Health Management   ?Start Date: 01/26/2021  ?Expected End Date: 09/22/2021  ?Priority: High  ?Note:   ?Current Barriers:  ?Chronic Disease Management support and education needs related to Health Maintenance-[name] is requesting a rollator for assistance with mobility and independence. She had a CT 08/08/21, provider will review these results on 08/22/21. [name] was contacted by [facility], and needs to return the call to learn about this resource.   ? ?RNCM Clinical Goal(s):  ?Patient will verbalize understanding of plan for management of Health Maintenance ?attend all scheduled medical appointments: 08/22/21 with Oncology and 10/06/21 with PCP ?continue to work with RN Care Manager to address care management and care coordination needs related to Health Maintenance  through collaboration with RN Care manager, provider, and care team.  ? ?Interventions: ?Inter-disciplinary care team collaboration (see longitudinal plan of care) ?Evaluation of current treatment plan related to  self management and patient's adherence to plan as established by provider ? ? ?Health Maintenance  (Status: Goal on Track (progressing): YES.) ?Evaluation of current treatment plan related to  Health Maintenance ,  self-management and patient's adherence to plan as established by provider. ?Discussed plans with patient for ongoing care management follow up and provided patient with direct contact information for care management team ?Reviewed scheduled/upcoming provider appointments including Iron infusion 08/22/21 with Oncology and 10/06/21 with PCP; ?Discussed plans with patient for ongoing care management follow up and provided patient with direct contact information for  care management team; ?Reviewed medications-she continues to receive  medications from [facility] and has contact information ?SDOH assessment ?Collaborate with PCP for requested rollator ?Provided [facility] transportation information [phone] and discussed details of how to schedule medical transportation ? ?Patient Goals/Self-Care Activities: ?Patient will self administer medications as prescribed ?Patient will attend all scheduled provider appointments ?Patient will call pharmacy for medication refills ?Patient will call provider office for new concerns or questions ?Patient will work with BSW to address care coordination needs and will continue to work with the clinical team to address health care and disease management related needs.   ? ? ?  ?  ?

## 2021-08-16 NOTE — Patient Outreach (Signed)
?Medicaid Managed Care   ?Nurse Care Manager Note ? ?08/16/2021 ?Name:  Suzanne [name] MRN:  [id number] DOB:  [birth year] ? ?Suzanne [name] is an 61 y.o. year old female who is a primary patient of [name], NP.  [facility] Managed Care Coordination team was consulted for assistance with:    ?Health maintenance ? ?Suzanne [name] was given information about Medicaid Managed Care Coordination team services today. Suzanne [name] Patient agreed to services and verbal consent obtained. ? ?Engaged with patient by telephone for follow up visit in response to provider referral for case management and/or care coordination services.  ? ?Assessments/Interventions:  Review of past medical history, allergies, medications, health status, including review of consultants reports, laboratory and other test data, was performed as part of comprehensive evaluation and provision of chronic care management services. ? ?SDOH (Social Determinants of Health) assessments and interventions performed: ?SDOH Interventions   ? ?Flowsheet Row Most Recent Value  ?SDOH Interventions   ?Financial Strain Interventions Intervention Not Indicated  ? ?  ? ? ?Care Plan ? ?Allergies  ?Allergen Reactions  ? Ferumoxytol Other (See Comments)  ?  Pt had hypersensitivity to Ferumoxytol. She had Facial Flushing. See note from 07/14/2021  ? ? ?Medications Reviewed Today   ? ? Reviewed by Melissa Montane, RN (Registered Nurse) on 08/16/21 at 1133  Med List Status: <None>  ? ?Medication Order Taking? Sig Documenting Provider Last Dose Status Informant  ?amoxicillin-clavulanate (AUGMENTIN) 875-125 MG tablet [id number] No Take 1 tablet by mouth 2 (two) times daily.  ?Patient not taking: Reported on 08/16/2021  ? Benay Pike, MD Not Taking Active   ?         ?Med Note ([name]   Wed Aug 16, 2021 11:28 AM) completed  ?anastrozole (ARIMIDEX) 1 MG tablet [id number] Yes Take 1 tablet (1 mg total) by mouth daily. [name], Virgie Dad, MD Taking Active    ?antiseptic oral rinse (BIOTENE) LIQD [id number] No 15 mLs by Mouth Rinse route as needed for dry mouth.  ?Patient not taking: Reported on 07/14/2020  ? [provider] Not Taking Active   ?Blood Pressure Monitor MISC [id number] Yes Medium sized cuff. Use to check blood pressure twice per day and more often if not feeling well. I10.0 [name], NP Taking Active   ?calcium-vitamin D (OSCAL WITH D) 500-200 MG-UNIT tablet [id number] No Take 1 tablet by mouth 3 (three) times daily.  ?Patient not taking: Reported on 08/16/2021  ? [name], Virgie Dad, MD Not Taking Consider Medication Status and Discontinue (Patient Preference)   ?         ?Med Note (Thelmer Legler A   Mon Jun 05, 2021  1:19 PM) Taking twice daily  ?cetirizine (ALLERGY RELIEF CETIRIZINE) 10 MG tablet [id number] Yes Take 1 tablet (10 mg total) by mouth daily. [name], NP Taking Active   ?cyanocobalamin ([employer] VITAMIN B12) 2000 MCG tablet [id number] No Take 1 tablet (2,000 mcg total) by mouth daily.  ?Patient not taking: Reported on 08/16/2021  ? [name], Virgie Dad, MD Not Taking Active   ?famotidine (PEPCID) 20 MG tablet [id number] Yes Take 1 tablet (20 mg total) by mouth 2 (two) times daily as needed for heartburn or indigestion. [name], Virgie Dad, MD Taking Active   ?folic acid (FOLVITE) 1 MG tablet [id number] Yes Take 1 tablet (1 mg total) by mouth daily. [name], Virgie Dad, MD Taking Active   ?furosemide (LASIX) 20 MG tablet [id number] Yes Take 1 tablet (  20 mg total) by mouth daily. [name], Virgie Dad, MD Taking Active   ?ketoconazole (NIZORAL) 2 % cream [id number] No APPLY TO THE AFFECTED AREA(S) DAILY AS NEEDED  ?Patient not taking: Reported on 06/05/2021  ? [name], Virgie Dad, MD Not Taking Active   ?nystatin (MYCOSTATIN/NYSTOP) powder [id number] No Apply 1 application topically as needed.  ?Patient not taking: Reported on 06/05/2021  ? Gardenia Phlegm, NP Not Taking Active   ?OYSTER SHELL CALCIUM PLUS D 500-5 MG-MCG TABS [id number] Yes  TAKE ONE TABLET BY MOUTH EVERY MORNING and TAKE ONE TABLET BY MOUTH AT NOON and TAKE ONE TABLET BY MOUTH EVERY EVENING [name], Virgie Dad, MD Taking Active   ?         ?Med Note (Lue Sykora A   Mon Jun 05, 2021  1:21 PM) Taking twice daily  ?palbociclib (IBRANCE) 75 MG tablet [id number] Yes TAKE 1 TABLET (75 MG TOTAL) BY MOUTH DAILY. TAKE FOR 21 DAYS ON, 7 DAYS OFF, REPEAT EVERY 28 DAYS Iruku, Praveena, MD Taking Active   ?polyethylene glycol (MIRALAX / GLYCOLAX) 17 g packet [id number] Yes Take 17 g by mouth as needed. [name], Virgie Dad, MD Taking Active   ?sertraline (ZOLOFT) 50 MG tablet [id number] Yes TAKE 1 AND 1/2 TABLETS BY MOUTH EVERYDAY AT BEDTIME Charlott Rakes, MD Taking Active   ?ZOFRAN 8 MG tablet [id number] Yes Take 1 tablet (8 mg total) by mouth every 8 (eight) hours. Gardenia Phlegm, NP Taking Active   ?Med List Note [name], RPH-CPP 11/13/19 1025): [name] filled at [facility]  ? ?  ?  ? ?  ? ? ?Patient Active Problem List  ? Diagnosis Date Noted  ? B12 deficiency anemia 08/15/2020  ? Intractable back pain 02/03/2019  ? Thoracic compression fracture, closed, initial encounter ([place]) 02/03/2019  ? Essential hypertension 02/03/2019  ? Genetic testing 01/27/2019  ? Acute pharyngitis   ? Bacteremia   ? FTT (failure to thrive) in adult   ? Fracture closed, humerus, shaft 01/09/2019  ? Pneumonia 12/25/2018  ? Acute respiratory disorder in immunocompromised patient ([place]) 12/25/2018  ? Family history of breast cancer   ? Family history of prostate cancer   ? Family history of melanoma   ? Family history of colon cancer   ? Pressure injury of skin 11/24/2018  ? Chronic respiratory failure with hypoxia and hypercapnia ([place]) 11/19/2018  ? 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 Hypophosphatemia 11/19/2018  ? Hypocalcemia 11/19/2018  ? Aspiration pneumonia ([place]) 11/19/2018  ? SOB (shortness of breath)   ? Palliative care by specialist   ? Carcinoma of breast, estrogen receptor  positive, stage 4, right ([place]) 11/16/2018  ? Hypoalbuminemia 11/16/2018  ? Pleural effusion 11/14/2018  ? Goals of care, counseling/discussion 11/06/2018  ? Malignant neoplasm of overlapping sites of right breast in female, estrogen receptor positive ([place]) 10/31/2018  ? Lung metastasis ([place]) 10/31/2018  ? Pain from bone metastases ([place]) 10/31/2018  ? Morbid obesity with BMI of 40.0-44.9, adult ([place]) 10/31/2018  ? Bone injury   ? Pleural effusion, bilateral   ? Shortness of breath   ? Abnormal breast finding   ? Hypokalemia   ? Breast skin changes   ? Macrocytic anemia   ? Bone metastasis ([place])   ? Anemia, B12 deficiency 10/26/2018  ? ? ?Conditions to be addressed/monitored per PCP order:   health maintenance ? ?Care Plan : RN Care Manager Plan of Care  ?Updates made by Melissa Montane, RN since 08/16/2021  12:00 AM  ?  ? ?Problem: Health Management Coordination Needs   ?  ? ?Long-Range Goal: Development of Plan of Care for needs related to Health Management   ?Start Date: 01/26/2021  ?Expected End Date: 09/22/2021  ?Priority: High  ?Note:   ?Current Barriers:  ?Chronic Disease Management support and education needs related to Health Maintenance-Ms. Finerty is requesting a rollator for assistance with mobility and independence. She had a CT 08/08/21, provider will review these results on 08/22/21. Ms. Knickerbocker was contacted by [facility], and needs to return the call to learn about this resource.   ? ?RNCM Clinical Goal(s):  ?Patient will verbalize understanding of plan for management of Health Maintenance ?attend all scheduled medical appointments: 08/22/21 with Oncology and 10/06/21 with PCP ?continue to work with RN Care Manager to address care management and care coordination needs related to Health Maintenance  through collaboration with RN Care manager, provider, and care team.  ? ?Interventions: ?Inter-disciplinary care team collaboration (see longitudinal plan of care) ?Evaluation of current treatment plan related to  self  management and patient's adherence to plan as established by provider ? ? ?Health Maintenance  (Status: Goal on Track (progressing): YES.) ?Evaluation of current treatment plan related to  Health Maintenance ,  s

## 2021-08-17 DIAGNOSIS — C50811 Malignant neoplasm of overlapping sites of right female breast: Secondary | ICD-10-CM | POA: Diagnosis not present

## 2021-08-18 DIAGNOSIS — C50811 Malignant neoplasm of overlapping sites of right female breast: Secondary | ICD-10-CM | POA: Diagnosis not present

## 2021-08-21 ENCOUNTER — Other Ambulatory Visit: Payer: Self-pay | Admitting: Nurse Practitioner

## 2021-08-21 DIAGNOSIS — C7802 Secondary malignant neoplasm of left lung: Secondary | ICD-10-CM

## 2021-08-21 DIAGNOSIS — C50811 Malignant neoplasm of overlapping sites of right female breast: Secondary | ICD-10-CM | POA: Diagnosis not present

## 2021-08-21 DIAGNOSIS — C7951 Secondary malignant neoplasm of bone: Secondary | ICD-10-CM

## 2021-08-22 ENCOUNTER — Other Ambulatory Visit: Payer: Medicaid Other

## 2021-08-22 ENCOUNTER — Encounter: Payer: Self-pay | Admitting: Hematology and Oncology

## 2021-08-22 ENCOUNTER — Inpatient Hospital Stay: Payer: Medicaid Other

## 2021-08-22 ENCOUNTER — Other Ambulatory Visit: Payer: Self-pay

## 2021-08-22 ENCOUNTER — Ambulatory Visit: Payer: Medicaid Other

## 2021-08-22 ENCOUNTER — Inpatient Hospital Stay (HOSPITAL_BASED_OUTPATIENT_CLINIC_OR_DEPARTMENT_OTHER): Payer: Medicaid Other | Admitting: Hematology and Oncology

## 2021-08-22 ENCOUNTER — Inpatient Hospital Stay: Payer: Medicaid Other | Attending: Adult Health

## 2021-08-22 VITALS — BP 142/82 | HR 84 | Temp 97.5°F | Resp 16 | Ht 59.0 in | Wt 186.5 lb

## 2021-08-22 DIAGNOSIS — R0602 Shortness of breath: Secondary | ICD-10-CM

## 2021-08-22 DIAGNOSIS — C50911 Malignant neoplasm of unspecified site of right female breast: Secondary | ICD-10-CM | POA: Insufficient documentation

## 2021-08-22 DIAGNOSIS — D649 Anemia, unspecified: Secondary | ICD-10-CM

## 2021-08-22 DIAGNOSIS — C50811 Malignant neoplasm of overlapping sites of right female breast: Secondary | ICD-10-CM | POA: Diagnosis not present

## 2021-08-22 DIAGNOSIS — C7951 Secondary malignant neoplasm of bone: Secondary | ICD-10-CM

## 2021-08-22 DIAGNOSIS — J90 Pleural effusion, not elsewhere classified: Secondary | ICD-10-CM

## 2021-08-22 DIAGNOSIS — D539 Nutritional anemia, unspecified: Secondary | ICD-10-CM | POA: Diagnosis not present

## 2021-08-22 DIAGNOSIS — E538 Deficiency of other specified B group vitamins: Secondary | ICD-10-CM | POA: Insufficient documentation

## 2021-08-22 DIAGNOSIS — Z170 Estrogen receptor positive status [ER+]: Secondary | ICD-10-CM | POA: Diagnosis not present

## 2021-08-22 DIAGNOSIS — C7801 Secondary malignant neoplasm of right lung: Secondary | ICD-10-CM

## 2021-08-22 LAB — COMPREHENSIVE METABOLIC PANEL
ALT: 11 U/L (ref 0–44)
AST: 29 U/L (ref 15–41)
Albumin: 3.9 g/dL (ref 3.5–5.0)
Alkaline Phosphatase: 102 U/L (ref 38–126)
Anion gap: 6 (ref 5–15)
BUN: 18 mg/dL (ref 8–23)
CO2: 33 mmol/L — ABNORMAL HIGH (ref 22–32)
Calcium: 9.5 mg/dL (ref 8.9–10.3)
Chloride: 102 mmol/L (ref 98–111)
Creatinine, Ser: 0.94 mg/dL (ref 0.44–1.00)
GFR, Estimated: >60 mL/min (ref >60)
Glucose, Bld: 97 mg/dL (ref 70–99)
Potassium: 3.5 mmol/L (ref 3.5–5.1)
Sodium: 141 mmol/L (ref 135–145)
Total Bilirubin: 0.5 mg/dL (ref 0.3–1.2)
Total Protein: 6.8 g/dL (ref 6.5–8.1)

## 2021-08-22 LAB — CBC WITH DIFFERENTIAL/PLATELET
Abs Immature Granulocytes: 0.01 10*3/uL (ref 0.00–0.07)
Basophils Absolute: 0.1 10*3/uL (ref 0.0–0.1)
Basophils Relative: 3 %
Eosinophils Absolute: 0 10*3/uL (ref 0.0–0.5)
Eosinophils Relative: 2 %
HCT: 30 % — ABNORMAL LOW (ref 36.0–46.0)
Hemoglobin: 9.6 g/dL — ABNORMAL LOW (ref 12.0–15.0)
Immature Granulocytes: 0 %
Lymphocytes Relative: 22 %
Lymphs Abs: 0.5 10*3/uL — ABNORMAL LOW (ref 0.7–4.0)
MCH: 35.7 pg — ABNORMAL HIGH (ref 26.0–34.0)
MCHC: 32 g/dL (ref 30.0–36.0)
MCV: 111.5 fL — ABNORMAL HIGH (ref 80.0–100.0)
Monocytes Absolute: 0.2 10*3/uL (ref 0.1–1.0)
Monocytes Relative: 7 %
Neutro Abs: 1.5 10*3/uL — ABNORMAL LOW (ref 1.7–7.7)
Neutrophils Relative %: 66 %
Platelets: 275 10*3/uL (ref 150–400)
RBC: 2.69 MIL/uL — ABNORMAL LOW (ref 3.87–5.11)
RDW: 16 % — ABNORMAL HIGH (ref 11.5–15.5)
WBC: 2.3 10*3/uL — ABNORMAL LOW (ref 4.0–10.5)
nRBC: 0 % (ref 0.0–0.2)

## 2021-08-22 LAB — VITAMIN B12: Vitamin B-12: 977 pg/mL — ABNORMAL HIGH (ref 180–914)

## 2021-08-22 LAB — FERRITIN: Ferritin: 49 ng/mL (ref 11–307)

## 2021-08-22 MED ORDER — CYANOCOBALAMIN 1000 MCG/ML IJ SOLN
1000.0000 ug | Freq: Once | INTRAMUSCULAR | Status: AC
  Administered 2021-08-22: 1000 ug via INTRAMUSCULAR
  Filled 2021-08-22: qty 1

## 2021-08-22 MED ORDER — DENOSUMAB 120 MG/1.7ML ~~LOC~~ SOLN
120.0000 mg | Freq: Once | SUBCUTANEOUS | Status: AC
  Administered 2021-08-22: 120 mg via SUBCUTANEOUS
  Filled 2021-08-22: qty 1.7

## 2021-08-22 NOTE — Patient Instructions (Signed)
Vitamin B12 Deficiency ?Vitamin B12 deficiency occurs when the body does not have enough vitamin B12, which is an important vitamin. The body needs this vitamin: ?To make red blood cells. ?To make DNA. This is the genetic material inside cells. ?To help the nerves work properly so they can carry messages from the brain to the body. ?Vitamin B12 deficiency can cause various health problems, such as a low red blood cell count (anemia) or nerve damage. ?What are the causes? ?This condition may be caused by: ?Not eating enough foods that contain vitamin B12. ?Not having enough stomach acid and digestive fluids to properly absorb vitamin B12 from the food that you eat. ?Certain digestive system diseases that make it hard to absorb vitamin B12. These diseases include Crohn's disease, chronic pancreatitis, and cystic fibrosis. ?A condition in which the body does not make enough of a protein (intrinsic factor), resulting in too few red blood cells (pernicious anemia). ?Having a surgery in which part of the stomach or small intestine is removed. ?Taking certain medicines that make it hard for the body to absorb vitamin B12. These medicines include: ?Heartburn medicines (antacids and proton pump inhibitors). ?Certain antibiotic medicines. ?Some medicines that are used to treat diabetes, tuberculosis, gout, or high cholesterol. ?What increases the risk? ?The following factors may make you more likely to develop a B12 deficiency: ?Being older than age 74. ?Eating a vegetarian or vegan diet, especially while you are pregnant. ?Eating a poor diet while you are pregnant. ?Taking certain medicines. ?Having alcoholism. ?What are the signs or symptoms? ?In some cases, there are no symptoms of this condition. If the condition leads to anemia or nerve damage, various symptoms can occur, such as: ?Weakness. ?Fatigue. ?Loss of appetite. ?Weight loss. ?Numbness or tingling in your hands and feet. ?Redness and burning of the  tongue. ?Confusion or memory problems. ?Depression. ?Sensory problems, such as color blindness, ringing in the ears, or loss of taste. ?Diarrhea or constipation. ?Trouble walking. ?If anemia is severe, symptoms can include: ?Shortness of breath. ?Dizziness. ?Rapid heart rate (tachycardia). ?How is this diagnosed? ?This condition may be diagnosed with a blood test to measure the level of vitamin B12 in your blood. You may also have other tests, including: ?A group of tests that measure certain characteristics of blood cells (complete blood count, CBC). ?A blood test to measure intrinsic factor. ?A procedure where a thin tube with a camera on the end is used to look into your stomach or intestines (endoscopy). ?Other tests may be needed to discover the cause of B12 deficiency. ?How is this treated? ?Treatment for this condition depends on the cause. This condition may be treated by: ?Changing your eating and drinking habits, such as: ?Eating more foods that contain vitamin B12. ?Drinking less alcohol or no alcohol. ?Getting vitamin B12 injections. ?Taking vitamin B12 supplements. Your health care provider will tell you which dosage is best for you. ?Follow these instructions at home: ?Eating and drinking ? ?Eat lots of healthy foods that contain vitamin B12, including: ?Meats and poultry. This includes beef, pork, chicken, [place], and organ meats, such as liver. ?Seafood. This includes clams, rainbow trout, salmon, tuna, and haddock. ?Eggs. ?Cereal and dairy products that are fortified. This means that vitamin B12 has been added to the food. Check the label on the package to see if the food is fortified. ?The items listed above may not be a complete list of recommended foods and beverages. Contact a dietitian for more information. ?General instructions ?Get any  injections that are prescribed by your health care provider. ?Take supplements only as told by your health care provider. Follow the directions carefully. ?Do  not drink alcohol if your health care provider tells you not to. In some cases, you may only be asked to limit alcohol use. ?Keep all follow-up visits as told by your health care provider. This is important. ?Contact a health care provider if: ?Your symptoms come back. ?Get help right away if you: ?Develop shortness of breath. ?Have a rapid heart rate. ?Have chest pain. ?Become dizzy or lose consciousness. ?Summary ?Vitamin B12 deficiency occurs when the body does not have enough vitamin B12. ?The main causes of vitamin B12 deficiency include dietary deficiency, digestive diseases, pernicious anemia, and having a surgery in which part of the stomach or small intestine is removed. ?In some cases, there are no symptoms of this condition. If the condition leads to anemia or nerve damage, various symptoms can occur, such as weakness, shortness of breath, and numbness. ?Treatment may include getting vitamin B12 injections or taking vitamin B12 supplements. Eat lots of healthy foods that contain vitamin B12. ?This information is not intended to replace advice given to you by your health care provider. Make sure you discuss any questions you have with your health care provider. ? ?Denosumab injection ?What is this medication? ?DENOSUMAB (den oh sue mab) slows bone breakdown. Prolia is used to treat osteoporosis in women after menopause and in men, and in people who are taking corticosteroids for 6 months or more. Delton See is used to treat a high calcium level due to cancer and to prevent bone fractures and other bone problems caused by multiple myeloma or cancer bone metastases. Delton See is also used to treat giant cell tumor of the bone. ?This medicine may be used for other purposes; ask your health care provider or pharmacist if you have questions. ?COMMON BRAND NAME(S): Prolia, XGEVA ?What should I tell my care team before I take this medication? ?They need to know if you have any of these conditions: ?dental disease ?having  surgery or tooth extraction ?infection ?kidney disease ?low levels of calcium or Vitamin D in the blood ?malnutrition ?on hemodialysis ?skin conditions or sensitivity ?thyroid or parathyroid disease ?an unusual reaction to denosumab, other medicines, foods, dyes, or preservatives ?pregnant or trying to get pregnant ?breast-feeding ?How should I use this medication? ?This medicine is for injection under the skin. It is given by a health care professional in a hospital or clinic setting. ?A special MedGuide will be given to you before each treatment. Be sure to read this information carefully each time. ?For Prolia, talk to your pediatrician regarding the use of this medicine in children. Special care may be needed. For Delton See, talk to your pediatrician regarding the use of this medicine in children. While this drug may be prescribed for children as young as 13 years for selected conditions, precautions do apply. ?Overdosage: If you think you have taken too much of this medicine contact a poison control center or emergency room at once. ?NOTE: This medicine is only for you. Do not share this medicine with others. ?What if I miss a dose? ?It is important not to miss your dose. Call your doctor or health care professional if you are unable to keep an appointment. ?What may interact with this medication? ?Do not take this medicine with any of the following medications: ?other medicines containing denosumab ?This medicine may also interact with the following medications: ?medicines that lower your chance of fighting  infection ?steroid medicines like prednisone or cortisone ?This list may not describe all possible interactions. Give your health care provider a list of all the medicines, herbs, non-prescription drugs, or dietary supplements you use. Also tell them if you smoke, drink alcohol, or use illegal drugs. Some items may interact with your medicine. ?What should I watch for while using this medication? ?Visit your  doctor or health care professional for regular checks on your progress. Your doctor or health care professional may order blood tests and other tests to see how you are doing. ?Call your doctor or health care profess

## 2021-08-22 NOTE — Progress Notes (Signed)
?[place]  ?Telephone:([phone]) [phone] [phone]  ? ?ID: Suzanne [name] DOB: 09-10-1960  MR#: [id number]  QJF#:[id number] ? ?Patient Care Team: ?[name], NP as PCP - General (Nurse Practitioner) ?Netta Cedars, MD as Consulting Physician (Orthopedic Surgery) ?Melissa Montane, RN as Case Manager ?Ethelda Chick as Social Worker ?Benay Pike, MD as Consulting Physician (Hematology and Oncology) ?OTHER MD: ? 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 ?CURRENT TREATMENT: Anastrozole; Palbociclib; Xgeva ? ?INTERVAL HISTORY: ? ?Patient is here for follow-up by herself.  She is in a wheelchair.  She has been taking all her medications as prescribed.  She denies any new adverse effects.  No new dental concerns.  She had CT imaging done 2 weeks ago.   ?No change in breathing, bowel habits or urinary habits.  She states the breast rash has resolved. ?Rest of the pertinent 10 point ROS reviewed and negative. ? ?Lab Results  ?Component Value Date  ? CA2729 131.8 (H) 07/25/2021  ? CA2729 101.1 (H) 06/27/2021  ? CA2729 83.7 (H) 05/30/2021  ? CA2729 97.7 (H) 05/02/2021  ? CA2729 79.7 (H) 03/31/2021  ? ? ? ? COVID 19 VACCINATION STATUS: Status post [place] x2 with booster December 2021 ? ? ?HISTORY OF CURRENT ILLNESS: ?From the original inpatient consult note: ? ?Suzanne [name] is a 61 year old female from [place], [place] without significant past medical history with exception of obesity, remote history of gallstones, and remote history of mastitis.  The patient presented to the hospital on 10/26/2018 with a 3-week history of worsening shortness of breath and lower extremity edema.  The patient reported that her shortness of breath worsened with exertion.  Her lower extremity edema did not improve with elevation.  She also had an intermittent pr oductive cough with rare sputum production.  The patient was noted to be anemic with a hemoglobin of 4.5 with a  clumped platelets noted on admission.  Stool for occult blood was positive.  Patient received 2 units of packed red blood cells.   ? ?The patient reported a rash under her breast x1 week which has been foul-smelling.  She also reported that she has some discomfort and thickening/hardness of her right breast for several months.  She states that her right breast is much larger than her left.  She notes some occasional bloody discharge from her breast once in a while.  A chest x-ray on admission showed diffuse bilateral interstitial opacity of unknown chronicity, findings could be related to interstitial inflammatory process, atypical/viral pneumonia, or possible metastatic disease.  There are also sclerotic and lytic lesions within the clavicles, bilateral ribs, shoulders which was concerning for diffuse few skeletal metastatic disease.   ? ?This prompted a CT scan of the chest with contrast which showed bilateral axillary and mediastinal adenopathy, borderline hilar lymph nodes bilaterally, findings likely flecked metastatic lymphadenopathy, moderate bilateral pleural effusions, extensive interstitial thickening and reticulonodular opacities throughout the lungs, cannot exclude lymphangitic spread of tumor, diffuse skeletal metastases. ? ?The patient's subsequent history is as detailed below. ? ? ?PAST MEDICAL HISTORY: ?Past Medical History:  ?Diagnosis Date  ? Cancer [facility])   ? Family history of breast cancer   ? Family history of colon cancer   ? Family history of melanoma   ? Family history of prostate cancer   ? Gallstones   ? Glaucoma   ? Mastitis   ? reports history of recurrent mastitis  ? Metastatic breast cancer ([place])   ?  Obesity   ? ? ?PAST SURGICAL HISTORY: ?Past Surgical History:  ?Procedure Laterality Date  ? GLAUCOMA SURGERY  late 1990's  ? IR THORACENTESIS ASP PLEURAL SPACE W/IMG GUIDE  10/28/2018  ? IR THORACENTESIS ASP PLEURAL SPACE W/IMG GUIDE  10/29/2018  ? OPEN REDUCTION INTERNAL FIXATION (ORIF) DISTAL  RADIAL FRACTURE Left 01/09/2019  ? Procedure: OPEN REDUCTION INTERNAL FIXATION (ORIF) HUMERAL FRACTURE;  Surgeon: Netta Cedars, MD;  Location: [facility];  Service: Orthopedics;  Laterality: Left;  ? ? ?FAMILY HISTORY: ?Family History  ?Problem Relation Age of Onset  ? Breast cancer Sister   ?     in her 71's-70s  ? Heart disease Brother   ?     CABG  ? Heart disease Brother   ?     CABG  ? Skin cancer Brother   ?     melanoma  ? Colon cancer Cousin   ? Heart attack Mother   ? Heart attack Father   ? Prostate cancer Brother   ?Patient's father passed away at age 53, and her mother at 94, both from heart attacks. The patient has 8 siblings, 6 brothers and 1 sister.  Her sister was diagnosed with breast cancer. One brother has prostate cancer, another brother had a melanoma removed.  ? ? ?GYNECOLOGIC HISTORY:  ?Menarche: 61 years old ?Age at first live birth: 61 years old ?GX P 1 ?LMP age 14 ?Contraceptive: used for 2-3 years with no problems ?HRT never used  ?Hysterectomy? no ? ? ?SOCIAL HISTORY:  ?Suzanne [name] worked for an Engineer, manufacturing systems. She is now on disability. She is widowed. She lives at home with her daughter.  Suzanne [name], 37, who is a Psychologist, occupational at [employer] center.  The patient has no grandchildren. She is not a Ambulance person. ? ? ADVANCED DIRECTIVES: not on file; she has the paperwork already. She intends to name her daughter as her HCPOA. ? ? ?HEALTH MAINTENANCE: ?Social History  ? ?Tobacco Use  ? Smoking status: Never  ? Smokeless tobacco: Never  ? Tobacco comments:  ?  second hand smoke exposure  ?Vaping Use  ? Vaping Use: Never used  ?Substance Use Topics  ? Alcohol use: No  ? Drug use: No  ? ? Colonoscopy:  ? PAP:  ? Bone density:  ?Mammography:  ? ?Allergies  ?Allergen Reactions  ? Ferumoxytol Other (See Comments)  ?  Pt had hypersensitivity to Ferumoxytol. She had Facial Flushing. See note from 07/14/2021  ? ? ?Current Outpatient Medications  ?Medication Sig Dispense Refill  ?  amoxicillin-clavulanate (AUGMENTIN) 875-125 MG tablet Take 1 tablet by mouth 2 (two) times daily. (Patient not taking: Reported on 08/16/2021) 14 tablet 0  ? anastrozole (ARIMIDEX) 1 MG tablet Take 1 tablet (1 mg total) by mouth daily. 90 tablet 4  ? antiseptic oral rinse (BIOTENE) LIQD 15 mLs by Mouth Rinse route as needed for dry mouth. (Patient not taking: Reported on 07/14/2020)    ? Blood Pressure Monitor MISC Medium sized cuff. Use to check blood pressure twice per day and more often if not feeling well. I10.0 1 each 0  ? calcium-vitamin D (OSCAL WITH D) 500-200 MG-UNIT tablet Take 1 tablet by mouth 3 (three) times daily. (Patient not taking: Reported on 08/16/2021) 90 tablet 1  ? cetirizine (ALLERGY RELIEF CETIRIZINE) 10 MG tablet Take 1 tablet (10 mg total) by mouth daily. 90 tablet 3  ? cyanocobalamin ([employer]S VITAMIN B12) 2000 MCG tablet Take 1 tablet (2,000 mcg total) by mouth daily. (  Patient not taking: Reported on 08/16/2021) 90 tablet 4  ? famotidine (PEPCID) 20 MG tablet Take 1 tablet (20 mg total) by mouth 2 (two) times daily as needed for heartburn or indigestion. 30 tablet 3  ? folic acid (FOLVITE) 1 MG tablet Take 1 tablet (1 mg total) by mouth daily. 90 tablet 4  ? furosemide (LASIX) 20 MG tablet Take 1 tablet (20 mg total) by mouth daily. 90 tablet 4  ? ketoconazole (NIZORAL) 2 % cream APPLY TO THE AFFECTED AREA(S) DAILY AS NEEDED (Patient not taking: Reported on 06/05/2021) 60 g 6  ? nystatin (MYCOSTATIN/NYSTOP) powder Apply 1 application topically as needed. (Patient not taking: Reported on 06/05/2021) 15 g 1  ? OYSTER SHELL CALCIUM PLUS D 500-5 MG-MCG TABS TAKE ONE TABLET BY MOUTH EVERY MORNING and TAKE ONE TABLET BY MOUTH AT NOON and TAKE ONE TABLET BY MOUTH EVERY EVENING 90 tablet 1  ? palbociclib (IBRANCE) 75 MG tablet TAKE 1 TABLET (75 MG TOTAL) BY MOUTH DAILY. TAKE FOR 21 DAYS ON, 7 DAYS OFF, REPEAT EVERY 28 DAYS 21 tablet 6  ? polyethylene glycol (MIRALAX / GLYCOLAX) 17 g packet Take 17 g by mouth  as needed. 14 each 6  ? sertraline (ZOLOFT) 50 MG tablet TAKE 1 AND 1/2 TABLETS BY MOUTH EVERYDAY AT BEDTIME 135 tablet 0  ? ZOFRAN 8 MG tablet Take 1 tablet (8 mg total) by mouth every 8 (eight) hours. 20 tablet 1  ? ?No cur

## 2021-08-23 ENCOUNTER — Telehealth: Payer: Self-pay | Admitting: Hematology and Oncology

## 2021-08-23 ENCOUNTER — Telehealth: Payer: Self-pay | Admitting: Nurse Practitioner

## 2021-08-23 DIAGNOSIS — C50811 Malignant neoplasm of overlapping sites of right female breast: Secondary | ICD-10-CM | POA: Diagnosis not present

## 2021-08-23 LAB — CANCER ANTIGEN 27.29: CA 27.29: 158.3 U/mL — ABNORMAL HIGH (ref 0.0–38.6)

## 2021-08-23 NOTE — Telephone Encounter (Signed)
Scheduled appointment per 03/28 los. Patient aware.  ?

## 2021-08-23 NOTE — Telephone Encounter (Signed)
I called [facility].  She explained that they checked Epic and could not find any documentation to support the needs for a rollator.  She then said that the patient received a walker 10/30/2018 and would not qualify for a rollator at this time anyway. They called the patient and explained that it would be private pay and the patient said she would think about it and call them back.  ?

## 2021-08-23 NOTE — Telephone Encounter (Signed)
Copied from [place] [id number]. Topic: General - Other >> Aug 23, 2021 12:30 PM Erick Blinks wrote: Reason for CRM: [name] called requesting additional documentation (office notes) that detail the medical need for the patient's Rollator order. Please fax  Fax: [place] contact: [phone]

## 2021-08-23 NOTE — Telephone Encounter (Signed)
Thank you :)

## 2021-08-24 DIAGNOSIS — C50811 Malignant neoplasm of overlapping sites of right female breast: Secondary | ICD-10-CM | POA: Diagnosis not present

## 2021-08-25 DIAGNOSIS — C50811 Malignant neoplasm of overlapping sites of right female breast: Secondary | ICD-10-CM | POA: Diagnosis not present

## 2021-08-26 DIAGNOSIS — R0602 Shortness of breath: Secondary | ICD-10-CM | POA: Diagnosis not present

## 2021-08-26 DIAGNOSIS — S22000A Wedge compression fracture of unspecified thoracic vertebra, initial encounter for closed fracture: Secondary | ICD-10-CM | POA: Diagnosis not present

## 2021-08-26 DIAGNOSIS — J9621 Acute and chronic respiratory failure with hypoxia: Secondary | ICD-10-CM | POA: Diagnosis not present

## 2021-08-26 DIAGNOSIS — C7801 Secondary malignant neoplasm of right lung: Secondary | ICD-10-CM | POA: Diagnosis not present

## 2021-08-27 ENCOUNTER — Encounter: Payer: Self-pay | Admitting: Hematology and Oncology

## 2021-08-28 DIAGNOSIS — C50811 Malignant neoplasm of overlapping sites of right female breast: Secondary | ICD-10-CM | POA: Diagnosis not present

## 2021-08-29 DIAGNOSIS — C50811 Malignant neoplasm of overlapping sites of right female breast: Secondary | ICD-10-CM | POA: Diagnosis not present

## 2021-08-30 DIAGNOSIS — C50811 Malignant neoplasm of overlapping sites of right female breast: Secondary | ICD-10-CM | POA: Diagnosis not present

## 2021-08-31 ENCOUNTER — Other Ambulatory Visit (HOSPITAL_COMMUNITY): Payer: Self-pay

## 2021-08-31 DIAGNOSIS — C50811 Malignant neoplasm of overlapping sites of right female breast: Secondary | ICD-10-CM | POA: Diagnosis not present

## 2021-09-01 ENCOUNTER — Other Ambulatory Visit (HOSPITAL_COMMUNITY): Payer: Self-pay

## 2021-09-01 DIAGNOSIS — C50811 Malignant neoplasm of overlapping sites of right female breast: Secondary | ICD-10-CM | POA: Diagnosis not present

## 2021-09-04 DIAGNOSIS — C50811 Malignant neoplasm of overlapping sites of right female breast: Secondary | ICD-10-CM | POA: Diagnosis not present

## 2021-09-05 ENCOUNTER — Other Ambulatory Visit (HOSPITAL_COMMUNITY): Payer: Self-pay

## 2021-09-05 DIAGNOSIS — C50811 Malignant neoplasm of overlapping sites of right female breast: Secondary | ICD-10-CM | POA: Diagnosis not present

## 2021-09-06 DIAGNOSIS — C50811 Malignant neoplasm of overlapping sites of right female breast: Secondary | ICD-10-CM | POA: Diagnosis not present

## 2021-09-07 DIAGNOSIS — C50811 Malignant neoplasm of overlapping sites of right female breast: Secondary | ICD-10-CM | POA: Diagnosis not present

## 2021-09-08 DIAGNOSIS — C50811 Malignant neoplasm of overlapping sites of right female breast: Secondary | ICD-10-CM | POA: Diagnosis not present

## 2021-09-12 DIAGNOSIS — C50811 Malignant neoplasm of overlapping sites of right female breast: Secondary | ICD-10-CM | POA: Diagnosis not present

## 2021-09-13 DIAGNOSIS — C50811 Malignant neoplasm of overlapping sites of right female breast: Secondary | ICD-10-CM | POA: Diagnosis not present

## 2021-09-14 DIAGNOSIS — C50811 Malignant neoplasm of overlapping sites of right female breast: Secondary | ICD-10-CM | POA: Diagnosis not present

## 2021-09-15 ENCOUNTER — Other Ambulatory Visit: Payer: Self-pay | Admitting: *Deleted

## 2021-09-15 DIAGNOSIS — C50811 Malignant neoplasm of overlapping sites of right female breast: Secondary | ICD-10-CM | POA: Diagnosis not present

## 2021-09-15 NOTE — Patient Instructions (Signed)
Visit Information ? ?[name] was given information about Medicaid Managed Care team care coordination services as a part of their [place] Medicaid benefit. Suzanne [name] verbally consented to engagement with the [facility] Managed Care team.  ? ?If you are experiencing a medical emergency, please call 911 or report to your local emergency department or urgent care.  ? ?If you have a non-emergency medical problem during routine business hours, please contact your provider's office and ask to speak with a nurse.  ? ?For questions related to your [facility], please call: [phone] or visit the homepage here: [email or web address] ? ?If you would like to schedule transportation through your [facility], please call the following number at least 2 days in advance of your appointment: [phone]. ? Rides for urgent appointments can also be made after hours by calling Member Services. ? ?Call the [place] at [phone], at any time, 24 hours a day, 7 days a week. If you are in danger or need immediate medical attention call 911. ? ?If you would like help to quit smoking, call 1-800-QUIT-NOW 636 769 1175) OR Espa?ol: 1-855-D?jelo-Ya 9597256851) o para m?s informaci?n haga clic aqu? or Text READY to 200-400 to register via text ? ?Suzanne [name], ? ? ?Please see education materials related to fall prevention provided by MyChart link. ? ?Patient verbalizes understanding of instructions and care plan provided today and agrees to view in [place]. Active MyChart status confirmed with patient.   ? ?Telephone follow up appointment with Managed Medicaid care management team member scheduled for:10/16/21 @ 2:30pm ? ?[name] RN, BSN ?[place] ?RN Care Coordinator ? ? ?Following is a copy of your plan of care:  ?Care Plan : [place] of Care  ?Updates made by Melissa Montane, RN since 09/15/2021 12:00 AM  ?  ? ?Problem: Health Management Coordination Needs   ?  ? ?Long-Range Goal: Development of Plan of Care for needs related to Health Management   ?Start Date: 01/26/2021  ?Expected End Date: 10/25/2021  ?Priority: High  ?Note:   ?Current Barriers:  ?Chronic Disease Management support and education needs related to Health Maintenance-Suzanne [name] was unable to receive the requested rollator, as she already has a rolling walker. She would much rather utilize a rollator for mobility. She is on the waiting list for meals on wheels, unsure of any timeline. She will call today to arrange transportation with [facility] provided transportation to her appointment on 09/19/21.   ? ?RNCM Clinical Goal(s):  ?Patient will verbalize understanding of plan for management of Health Maintenance ?attend all scheduled medical appointments: 09/19/21 with Oncology and 10/06/21 with PCP ?continue to work with RN Care Manager to address care management and care coordination needs related to Health Maintenance  through collaboration with RN Care manager, provider, and care team.  ? ?Interventions: ?Inter-disciplinary care team collaboration (see longitudinal plan of care) ?Evaluation of current treatment plan related to  self management and patient's adherence to plan as established by provider ?Provided patient with [facility] [phone], call to inquire about rollator ? ? ?Health Maintenance  (Status: Goal on Track (progressing): YES.) ?Evaluation of current treatment plan related to  Health Maintenance ,  self-management and patient's adherence to plan as established by provider. ?Discussed plans with patient for ongoing care management follow up and provided patient with direct contact information for care management team ?Reviewed scheduled/upcoming provider appointments including [place] [facility]  09/19/21 with Oncology and 10/06/21 with PCP; ?Discussed plans with  patient for ongoing care management follow up and provided patient with direct contact information for care management team; ?Reviewed medications-she continues to receive medications from [facility] and has contact information ?SDOH assessment ?Provided [facility] transportation information [phone] and discussed details of how to schedule medical transportation ? ?Patient Goals/Self-Care Activities: ?Patient will self administer medications as prescribed ?Patient will attend all scheduled provider appointments ?Patient will call pharmacy for medication refills ?Patient will call provider office for new concerns or questions ?Patient will work with BSW to address care coordination needs and will continue to work with the clinical team to address health care and disease management related needs.   ? ? ?  ?  ?

## 2021-09-15 NOTE — Patient Outreach (Signed)
?Medicaid Managed Care   ?Nurse Care Manager Note ? ?09/15/2021 ?Name:  Suzanne [name] MRN:  [id number] DOB:  08-12-1960 ? ?Suzanne [name] is an 61 y.o. year old female who is a primary patient of [name], NP.  The [facility] Managed Care Coordination team was consulted for assistance with:    ?Health Maintenance ? ?Suzanne [name] was given information about Medicaid Managed Care Coordination team services today. Suzanne [name] Patient agreed to services and verbal consent obtained. ? ?Engaged with patient by telephone for follow up visit in response to provider referral for case management and/or care coordination services.  ? ?Assessments/Interventions:  Review of past medical history, allergies, medications, health status, including review of consultants reports, laboratory and other test data, was performed as part of comprehensive evaluation and provision of chronic care management services. ? ?SDOH (Social Determinants of Health) assessments and interventions performed: ?SDOH Interventions   ? ?Flowsheet Row Most Recent Value  ?SDOH Interventions   ?Housing Interventions Intervention Not Indicated  ?Transportation Interventions Other (Comment)  [Reviewed [employer] transportation [phone]  ? ?  ? ? ?Care Plan ? ?Allergies  ?Allergen Reactions  ? Ferumoxytol Other (See Comments)  ?  Pt had hypersensitivity to Ferumoxytol. She had Facial Flushing. See note from 07/14/2021  ? ? ?Medications Reviewed Today   ? ? Reviewed by Melissa Montane, RN (Registered Nurse) on 09/15/21 at 1138  Med List Status: <None>  ? ?Medication Order Taking? Sig Documenting Provider Last Dose Status Informant  ?amoxicillin-clavulanate (AUGMENTIN) 875-125 MG tablet [id number] No Take 1 tablet by mouth 2 (two) times daily.  ?Patient not taking: Reported on 08/16/2021  ? Benay Pike, MD Not Taking Active   ?         ?Med Note (Dorean Daniello A   Wed Aug 16, 2021 11:28 AM) completed  ?anastrozole (ARIMIDEX) 1 MG tablet [id number] Yes Take  1 tablet (1 mg total) by mouth daily. [name], Virgie Dad, MD Taking Active   ?antiseptic oral rinse (BIOTENE) LIQD [id number] No 15 mLs by Mouth Rinse route as needed for dry mouth.  ?Patient not taking: Reported on 07/14/2020  ? [provider] Not Taking Active   ?Blood Pressure Monitor MISC [id number]  Medium sized cuff. Use to check blood pressure twice per day and more often if not feeling well. I10.0 [name], NP  Active   ?calcium-vitamin D (OSCAL WITH D) 500-200 MG-UNIT tablet [id number]  Take 1 tablet by mouth 3 (three) times daily.  ?Patient not taking: Reported on 08/16/2021  ? [name], Virgie Dad, MD  Active   ?         ?Med Note (Cara Thaxton A   Mon Jun 05, 2021  1:19 PM) Taking twice daily  ?cetirizine (ALLERGY RELIEF CETIRIZINE) 10 MG tablet [id number] Yes Take 1 tablet (10 mg total) by mouth daily. [name], NP Taking Active   ?cyanocobalamin (CVS VITAMIN B12) 2000 MCG tablet [id number] No Take 1 tablet (2,000 mcg total) by mouth daily.  ?Patient not taking: Reported on 08/16/2021  ? [name], Virgie Dad, MD Not Taking Active   ?famotidine (PEPCID) 20 MG tablet [id number] Yes Take 1 tablet (20 mg total) by mouth 2 (two) times daily as needed for heartburn or indigestion. [name], Virgie Dad, MD Taking Active   ?folic acid (FOLVITE) 1 MG tablet [id number] Yes Take 1 tablet (1 mg total) by mouth daily. [name], Virgie Dad, MD Taking Active   ?furosemide (LASIX) 20 MG tablet [id number] Yes Take  1 tablet (20 mg total) by mouth daily. [name], Virgie Dad, MD Taking Active   ?ketoconazole (NIZORAL) 2 % cream [id number] No APPLY TO THE AFFECTED AREA(S) DAILY AS NEEDED  ?Patient not taking: Reported on 06/05/2021  ? [name], Virgie Dad, MD Not Taking Active   ?nystatin (MYCOSTATIN/NYSTOP) powder [id number] No Apply 1 application topically as needed.  ?Patient not taking: Reported on 06/05/2021  ? Gardenia Phlegm, NP Not Taking Active   ?OYSTER SHELL CALCIUM PLUS D 500-5 MG-MCG TABS [id number] Yes  TAKE ONE TABLET BY MOUTH EVERY MORNING and TAKE ONE TABLET BY MOUTH AT NOON and TAKE ONE TABLET BY MOUTH EVERY EVENING [name], Virgie Dad, MD Taking Active   ?         ?Med Note (Landrey Mahurin A   Mon Jun 05, 2021  1:21 PM) Taking twice daily  ?palbociclib (IBRANCE) 75 MG tablet [id number] Yes TAKE 1 TABLET (75 MG TOTAL) BY MOUTH DAILY. TAKE FOR 21 DAYS ON, 7 DAYS OFF, REPEAT EVERY 28 DAYS Iruku, Praveena, MD Taking Active   ?polyethylene glycol (MIRALAX / GLYCOLAX) 17 g packet [id number] Yes Take 17 g by mouth as needed. [name], Virgie Dad, MD Taking Active   ?sertraline (ZOLOFT) 50 MG tablet [id number] Yes TAKE 1 AND 1/2 TABLETS BY MOUTH EVERYDAY AT BEDTIME Charlott Rakes, MD Taking Active   ?ZOFRAN 8 MG tablet [id number] Yes Take 1 tablet (8 mg total) by mouth every 8 (eight) hours. Gardenia Phlegm, NP Taking Active   ?Med List Note [name], RPH-CPP 11/13/19 6301): [name] filled at [facility]  ? ?  ?  ? ?  ? ? ?Patient Active Problem List  ? Diagnosis Date Noted  ? B12 deficiency anemia 08/15/2020  ? Intractable back pain 02/03/2019  ? Thoracic compression fracture, closed, initial encounter ([place]) 02/03/2019  ? Essential hypertension 02/03/2019  ? Genetic testing 01/27/2019  ? Acute pharyngitis   ? Bacteremia   ? FTT (failure to thrive) in adult   ? Fracture closed, humerus, shaft 01/09/2019  ? Pneumonia 12/25/2018  ? Acute respiratory disorder in immunocompromised patient ([place]) 12/25/2018  ? Family history of breast cancer   ? Family history of prostate cancer   ? Family history of melanoma   ? Family history of colon cancer   ? Pressure injury of skin 11/24/2018  ? Chronic respiratory failure with hypoxia and hypercapnia ([place]) 11/19/2018  ? 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 Hypophosphatemia 11/19/2018  ? Hypocalcemia 11/19/2018  ? Aspiration pneumonia ([place]) 11/19/2018  ? SOB (shortness of breath)   ? Palliative care by specialist   ? Carcinoma of breast, estrogen receptor  positive, stage 4, right ([place]) 11/16/2018  ? Hypoalbuminemia 11/16/2018  ? Pleural effusion 11/14/2018  ? Goals of care, counseling/discussion 11/06/2018  ? Malignant neoplasm of overlapping sites of right breast in female, estrogen receptor positive ([place]) 10/31/2018  ? Lung metastasis 10/31/2018  ? Pain from bone metastases ([place]) 10/31/2018  ? Morbid obesity with BMI of 40.0-44.9, adult ([place]) 10/31/2018  ? Bone injury   ? Pleural effusion, bilateral   ? Shortness of breath   ? Abnormal breast finding   ? Hypokalemia   ? Breast skin changes   ? Macrocytic anemia   ? Malignant neoplasm metastatic to bone [facility])   ? Anemia, B12 deficiency 10/26/2018  ? ? ?Conditions to be addressed/monitored per PCP order:   Health Maintenance ? ?Care Plan : RN Care Manager Plan of Care  ?Updates made by [name]  A, RN since 09/15/2021 12:00 AM  ?  ? ?Problem: Health Management Coordination Needs   ?  ? ?Long-Range Goal: Development of Plan of Care for needs related to Health Management   ?Start Date: 01/26/2021  ?Expected End Date: 10/25/2021  ?Priority: High  ?Note:   ?Current Barriers:  ?Chronic Disease Management support and education needs related to Health Maintenance-Suzanne [name] was unable to receive the requested rollator, as she already has a rolling walker. She would much rather utilize a rollator for mobility. She is on the waiting list for meals on wheels, unsure of any timeline. She will call today to arrange transportation with [facility] provided transportation to her appointment on 09/19/21.   ? ?RNCM Clinical Goal(s):  ?Patient will verbalize understanding of plan for management of Health Maintenance ?attend all scheduled medical appointments: 09/19/21 with Oncology and 10/06/21 with PCP ?continue to work with RN Care Manager to address care management and care coordination needs related to Health Maintenance  through collaboration with RN Care manager, provider, and care team.  ? ?Interventions: ?Inter-disciplinary care team  collaboration (see longitudinal plan of care) ?Evaluation of current treatment plan related to  self management and patient's adherence to plan as established by provider ?Provided patient with [facility]

## 2021-09-18 DIAGNOSIS — C50811 Malignant neoplasm of overlapping sites of right female breast: Secondary | ICD-10-CM | POA: Diagnosis not present

## 2021-09-19 ENCOUNTER — Other Ambulatory Visit: Payer: Self-pay | Admitting: Hematology and Oncology

## 2021-09-19 ENCOUNTER — Inpatient Hospital Stay: Payer: Medicaid Other

## 2021-09-19 ENCOUNTER — Other Ambulatory Visit: Payer: Self-pay

## 2021-09-19 ENCOUNTER — Inpatient Hospital Stay: Payer: Medicaid Other | Attending: Adult Health

## 2021-09-19 ENCOUNTER — Encounter: Payer: Self-pay | Admitting: Hematology and Oncology

## 2021-09-19 ENCOUNTER — Inpatient Hospital Stay (HOSPITAL_BASED_OUTPATIENT_CLINIC_OR_DEPARTMENT_OTHER): Payer: Medicaid Other | Admitting: Hematology and Oncology

## 2021-09-19 VITALS — BP 120/65 | HR 80 | Temp 98.2°F | Resp 17

## 2021-09-19 VITALS — BP 126/74 | HR 86 | Temp 97.5°F | Resp 18 | Ht 59.0 in | Wt 186.3 lb

## 2021-09-19 DIAGNOSIS — C7951 Secondary malignant neoplasm of bone: Secondary | ICD-10-CM | POA: Diagnosis present

## 2021-09-19 DIAGNOSIS — C50911 Malignant neoplasm of unspecified site of right female breast: Secondary | ICD-10-CM | POA: Insufficient documentation

## 2021-09-19 DIAGNOSIS — C50811 Malignant neoplasm of overlapping sites of right female breast: Secondary | ICD-10-CM | POA: Diagnosis not present

## 2021-09-19 DIAGNOSIS — D649 Anemia, unspecified: Secondary | ICD-10-CM | POA: Diagnosis present

## 2021-09-19 DIAGNOSIS — J90 Pleural effusion, not elsewhere classified: Secondary | ICD-10-CM

## 2021-09-19 DIAGNOSIS — D509 Iron deficiency anemia, unspecified: Secondary | ICD-10-CM

## 2021-09-19 DIAGNOSIS — Z170 Estrogen receptor positive status [ER+]: Secondary | ICD-10-CM

## 2021-09-19 DIAGNOSIS — C7801 Secondary malignant neoplasm of right lung: Secondary | ICD-10-CM

## 2021-09-19 DIAGNOSIS — R0602 Shortness of breath: Secondary | ICD-10-CM

## 2021-09-19 LAB — COMPREHENSIVE METABOLIC PANEL
ALT: 9 U/L (ref 0–44)
AST: 17 U/L (ref 15–41)
Albumin: 4 g/dL (ref 3.5–5.0)
Alkaline Phosphatase: 113 U/L (ref 38–126)
Anion gap: 9 (ref 5–15)
BUN: 17 mg/dL (ref 8–23)
CO2: 34 mmol/L — ABNORMAL HIGH (ref 22–32)
Calcium: 10.1 mg/dL (ref 8.9–10.3)
Chloride: 100 mmol/L (ref 98–111)
Creatinine, Ser: 1.06 mg/dL — ABNORMAL HIGH (ref 0.44–1.00)
GFR, Estimated: 59 mL/min — ABNORMAL LOW (ref >60)
Glucose, Bld: 113 mg/dL — ABNORMAL HIGH (ref 70–99)
Potassium: 3.3 mmol/L — ABNORMAL LOW (ref 3.5–5.1)
Sodium: 143 mmol/L (ref 135–145)
Total Bilirubin: 0.4 mg/dL (ref 0.3–1.2)
Total Protein: 7 g/dL (ref 6.5–8.1)

## 2021-09-19 LAB — CBC WITH DIFFERENTIAL/PLATELET
Abs Immature Granulocytes: 0.01 10*3/uL (ref 0.00–0.07)
Basophils Absolute: 0.1 10*3/uL (ref 0.0–0.1)
Basophils Relative: 3 %
Eosinophils Absolute: 0 10*3/uL (ref 0.0–0.5)
Eosinophils Relative: 2 %
HCT: 30.2 % — ABNORMAL LOW (ref 36.0–46.0)
Hemoglobin: 10 g/dL — ABNORMAL LOW (ref 12.0–15.0)
Immature Granulocytes: 0 %
Lymphocytes Relative: 24 %
Lymphs Abs: 0.6 10*3/uL — ABNORMAL LOW (ref 0.7–4.0)
MCH: 35.5 pg — ABNORMAL HIGH (ref 26.0–34.0)
MCHC: 33.1 g/dL (ref 30.0–36.0)
MCV: 107.1 fL — ABNORMAL HIGH (ref 80.0–100.0)
Monocytes Absolute: 0.2 10*3/uL (ref 0.1–1.0)
Monocytes Relative: 8 %
Neutro Abs: 1.7 10*3/uL (ref 1.7–7.7)
Neutrophils Relative %: 63 %
Platelets: 359 10*3/uL (ref 150–400)
RBC: 2.82 MIL/uL — ABNORMAL LOW (ref 3.87–5.11)
RDW: 15.4 % (ref 11.5–15.5)
Smear Review: NORMAL
WBC: 2.7 10*3/uL — ABNORMAL LOW (ref 4.0–10.5)
nRBC: 0 % (ref 0.0–0.2)

## 2021-09-19 LAB — FERRITIN: Ferritin: 12 ng/mL (ref 11–307)

## 2021-09-19 LAB — VITAMIN B12: Vitamin B-12: 771 pg/mL (ref 180–914)

## 2021-09-19 MED ORDER — SODIUM CHLORIDE 0.9 % IV SOLN: Freq: Once | INTRAVENOUS | Status: AC

## 2021-09-19 MED ORDER — SODIUM CHLORIDE 0.9 % IV SOLN
510.0000 mg | Freq: Once | INTRAVENOUS | Status: AC
  Administered 2021-09-19: 510 mg via INTRAVENOUS
  Filled 2021-09-19: qty 510

## 2021-09-19 MED ORDER — FAMOTIDINE IN NACL 20-0.9 MG/50ML-% IV SOLN
20.0000 mg | Freq: Once | INTRAVENOUS | Status: AC
  Administered 2021-09-19: 20 mg via INTRAVENOUS
  Filled 2021-09-19: qty 50

## 2021-09-19 MED ORDER — DENOSUMAB 120 MG/1.7ML ~~LOC~~ SOLN
120.0000 mg | Freq: Once | SUBCUTANEOUS | Status: AC
  Administered 2021-09-19: 120 mg via SUBCUTANEOUS
  Filled 2021-09-19: qty 1.7

## 2021-09-19 NOTE — Progress Notes (Signed)
?Suzanne [name]  ?[phone] [phone]  ? ?ID: Suzanne [name] DOB: Jul 05, 1960  MR#: [id number]  JGG#:[id number] ? ?Patient Care Team: ?[name], NP as PCP - General (Nurse Practitioner) ?Netta Cedars, MD as Consulting Physician (Orthopedic Surgery) ?Melissa Montane, RN as Case Manager ?Ethelda Chick as Social Worker ?Benay Pike, MD as Consulting Physician (Hematology and Oncology) ?OTHER MD: ? 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 ?CURRENT TREATMENT: Anastrozole; Palbociclib; Xgeva ? ?INTERVAL HISTORY: ? ?Patient is here for follow-up by herself.  She is in a wheelchair.  She is tolerating Ibrance and anastrozole well.  No adverse effects reported.  She is getting her Delton See as well as her iron today.  She tolerated iron infusion well back in February.  No change in breathing, bowel habits or urinary habits.   ?Rest of the pertinent 10 point ROS reviewed and negative. ? ?Lab Results  ?Component Value Date  ? CA2729 158.3 (H) 08/22/2021  ? CA2729 131.8 (H) 07/25/2021  ? CA2729 101.1 (H) 06/27/2021  ? CA2729 83.7 (H) 05/30/2021  ? CA2729 97.7 (H) 05/02/2021  ? ? ? ? COVID 19 VACCINATION STATUS: Status post [place] x2 with booster December 2021 ? ? ?HISTORY OF CURRENT ILLNESS: ?From the original inpatient consult note: ? ?[name] is a 61 year old female from [place], [place] without significant past medical history with exception of obesity, remote history of gallstones, and remote history of mastitis.  The patient presented to the hospital on 10/26/2018 with a 3-week history of worsening shortness of breath and lower extremity edema.  The patient reported that her shortness of breath worsened with exertion.  Her lower extremity edema did not improve with elevation.  She also had an intermittent pr oductive cough with rare sputum production.  The patient was noted to be anemic with a hemoglobin of 4.5 with a clumped platelets  noted on admission.  Stool for occult blood was positive.  Patient received 2 units of packed red blood cells.   ? ?The patient reported a rash under her breast x1 week which has been foul-smelling.  She also reported that she has some discomfort and thickening/hardness of her right breast for several months.  She states that her right breast is much larger than her left.  She notes some occasional bloody discharge from her breast once in a while.  A chest x-ray on admission showed diffuse bilateral interstitial opacity of unknown chronicity, findings could be related to interstitial inflammatory process, atypical/viral pneumonia, or possible metastatic disease.  There are also sclerotic and lytic lesions within the clavicles, bilateral ribs, shoulders which was concerning for diffuse few skeletal metastatic disease.   ? ?This prompted a CT scan of the chest with contrast which showed bilateral axillary and mediastinal adenopathy, borderline hilar lymph nodes bilaterally, findings likely flecked metastatic lymphadenopathy, moderate bilateral pleural effusions, extensive interstitial thickening and reticulonodular opacities throughout the lungs, cannot exclude lymphangitic spread of tumor, diffuse skeletal metastases. ? ?The patient's subsequent history is as detailed below. ? ? ?PAST MEDICAL HISTORY: ?Past Medical History:  ?Diagnosis Date  ? Cancer [facility])   ? Family history of breast cancer   ? Family history of colon cancer   ? Family history of melanoma   ? Family history of prostate cancer   ? Gallstones   ? Glaucoma   ? Mastitis   ? reports history of recurrent mastitis  ? Metastatic breast cancer ([place])   ? Obesity   ? ? ?  PAST SURGICAL HISTORY: ?Past Surgical History:  ?Procedure Laterality Date  ? GLAUCOMA SURGERY  late 1990's  ? IR THORACENTESIS ASP PLEURAL SPACE W/IMG GUIDE  10/28/2018  ? IR THORACENTESIS ASP PLEURAL SPACE W/IMG GUIDE  10/29/2018  ? OPEN REDUCTION INTERNAL FIXATION (ORIF) DISTAL RADIAL FRACTURE  Left 01/09/2019  ? Procedure: OPEN REDUCTION INTERNAL FIXATION (ORIF) HUMERAL FRACTURE;  Surgeon: Netta Cedars, MD;  Location: [facility];  Service: Orthopedics;  Laterality: Left;  ? ? ?FAMILY HISTORY: ?Family History  ?Problem Relation Age of Onset  ? Breast cancer Sister   ?     in her 98's-70s  ? Heart disease Brother   ?     CABG  ? Heart disease Brother   ?     CABG  ? Skin cancer Brother   ?     melanoma  ? Colon cancer Cousin   ? Heart attack Mother   ? Heart attack Father   ? Prostate cancer Brother   ?Patient's father passed away at age 42, and her mother at 41, both from heart attacks. The patient has 8 siblings, 6 brothers and 1 sister.  Her sister was diagnosed with breast cancer. One brother has prostate cancer, another brother had a melanoma removed.  ? ? ?GYNECOLOGIC HISTORY:  ?Menarche: 61 years old ?Age at first live birth: 62 years old ?GX P 1 ?LMP age 59 ?Contraceptive: used for 2-3 years with no problems ?HRT never used  ?Hysterectomy? no ? ? ?SOCIAL HISTORY:  ?Suzanne [name] worked for an Engineer, manufacturing systems. She is now on disability. She is widowed. She lives at home with her daughter.  [name], 37, who is a Psychologist, occupational at [employer] center.  The patient has no grandchildren. She is not a Ambulance person. ? ? ADVANCED DIRECTIVES: not on file; she has the paperwork already. She intends to name her daughter as her HCPOA. ? ? ?HEALTH MAINTENANCE: ?Social History  ? ?Tobacco Use  ? Smoking status: Never  ? Smokeless tobacco: Never  ? Tobacco comments:  ?  second hand smoke exposure  ?Vaping Use  ? Vaping Use: Never used  ?Substance Use Topics  ? Alcohol use: No  ? Drug use: No  ? ? Colonoscopy:  ? PAP:  ? Bone density:  ?Mammography:  ? ?Allergies  ?Allergen Reactions  ? Ferumoxytol Other (See Comments)  ?  Pt had hypersensitivity to Ferumoxytol. She had Facial Flushing. See note from 07/14/2021  ? ? ?Current Outpatient Medications  ?Medication Sig Dispense Refill  ? amoxicillin-clavulanate (AUGMENTIN)  875-125 MG tablet Take 1 tablet by mouth 2 (two) times daily. (Patient not taking: Reported on 08/16/2021) 14 tablet 0  ? anastrozole (ARIMIDEX) 1 MG tablet Take 1 tablet (1 mg total) by mouth daily. 90 tablet 4  ? antiseptic oral rinse (BIOTENE) LIQD 15 mLs by Mouth Rinse route as needed for dry mouth. (Patient not taking: Reported on 07/14/2020)    ? Blood Pressure Monitor MISC Medium sized cuff. Use to check blood pressure twice per day and more often if not feeling well. I10.0 1 each 0  ? calcium-vitamin D (OSCAL WITH D) 500-200 MG-UNIT tablet Take 1 tablet by mouth 3 (three) times daily. (Patient not taking: Reported on 08/16/2021) 90 tablet 1  ? cetirizine (ALLERGY RELIEF CETIRIZINE) 10 MG tablet Take 1 tablet (10 mg total) by mouth daily. 90 tablet 3  ? cyanocobalamin ([facility] VITAMIN B12) 2000 MCG tablet Take 1 tablet (2,000 mcg total) by mouth daily. (Patient not taking: Reported on  08/16/2021) 90 tablet 4  ? famotidine (PEPCID) 20 MG tablet Take 1 tablet (20 mg total) by mouth 2 (two) times daily as needed for heartburn or indigestion. 30 tablet 3  ? folic acid (FOLVITE) 1 MG tablet Take 1 tablet (1 mg total) by mouth daily. 90 tablet 4  ? furosemide (LASIX) 20 MG tablet Take 1 tablet (20 mg total) by mouth daily. 90 tablet 4  ? ketoconazole (NIZORAL) 2 % cream APPLY TO THE AFFECTED AREA(S) DAILY AS NEEDED (Patient not taking: Reported on 06/05/2021) 60 g 6  ? nystatin (MYCOSTATIN/NYSTOP) powder Apply 1 application topically as needed. (Patient not taking: Reported on 06/05/2021) 15 g 1  ? OYSTER SHELL CALCIUM PLUS D 500-5 MG-MCG TABS TAKE ONE TABLET BY MOUTH EVERY MORNING and TAKE ONE TABLET BY MOUTH AT NOON and TAKE ONE TABLET BY MOUTH EVERY EVENING 90 tablet 1  ? palbociclib (IBRANCE) 75 MG tablet TAKE 1 TABLET (75 MG TOTAL) BY MOUTH DAILY. TAKE FOR 21 DAYS ON, 7 DAYS OFF, REPEAT EVERY 28 DAYS 21 tablet 6  ? polyethylene glycol (MIRALAX / GLYCOLAX) 17 g packet Take 17 g by mouth as needed. 14 each 6  ? sertraline  (ZOLOFT) 50 MG tablet TAKE 1 AND 1/2 TABLETS BY MOUTH EVERYDAY AT BEDTIME 135 tablet 0  ? ZOFRAN 8 MG tablet Take 1 tablet (8 mg total) by mouth every 8 (eight) hours. 20 tablet 1  ? ?No current facility

## 2021-09-19 NOTE — Patient Instructions (Signed)

## 2021-09-20 ENCOUNTER — Encounter (HOSPITAL_COMMUNITY): Payer: Self-pay | Admitting: *Deleted

## 2021-09-20 DIAGNOSIS — C50811 Malignant neoplasm of overlapping sites of right female breast: Secondary | ICD-10-CM | POA: Diagnosis not present

## 2021-09-20 LAB — CANCER ANTIGEN 27.29: CA 27.29: 169.7 U/mL — ABNORMAL HIGH (ref 0.0–38.6)

## 2021-09-21 ENCOUNTER — Other Ambulatory Visit (HOSPITAL_COMMUNITY): Payer: Self-pay

## 2021-09-21 DIAGNOSIS — C50811 Malignant neoplasm of overlapping sites of right female breast: Secondary | ICD-10-CM | POA: Diagnosis not present

## 2021-09-25 ENCOUNTER — Other Ambulatory Visit (HOSPITAL_COMMUNITY): Payer: Self-pay

## 2021-09-25 DIAGNOSIS — C7801 Secondary malignant neoplasm of right lung: Secondary | ICD-10-CM | POA: Diagnosis not present

## 2021-09-25 DIAGNOSIS — C50811 Malignant neoplasm of overlapping sites of right female breast: Secondary | ICD-10-CM | POA: Diagnosis not present

## 2021-09-25 DIAGNOSIS — R0602 Shortness of breath: Secondary | ICD-10-CM | POA: Diagnosis not present

## 2021-09-25 DIAGNOSIS — S22000A Wedge compression fracture of unspecified thoracic vertebra, initial encounter for closed fracture: Secondary | ICD-10-CM | POA: Diagnosis not present

## 2021-09-25 DIAGNOSIS — J9621 Acute and chronic respiratory failure with hypoxia: Secondary | ICD-10-CM | POA: Diagnosis not present

## 2021-09-26 ENCOUNTER — Other Ambulatory Visit: Payer: Self-pay

## 2021-09-26 ENCOUNTER — Inpatient Hospital Stay: Payer: Medicaid Other | Attending: Adult Health

## 2021-09-26 VITALS — BP 108/80 | HR 77 | Temp 98.3°F | Resp 18

## 2021-09-26 DIAGNOSIS — C50911 Malignant neoplasm of unspecified site of right female breast: Secondary | ICD-10-CM | POA: Diagnosis present

## 2021-09-26 DIAGNOSIS — D509 Iron deficiency anemia, unspecified: Secondary | ICD-10-CM | POA: Diagnosis present

## 2021-09-26 DIAGNOSIS — C7951 Secondary malignant neoplasm of bone: Secondary | ICD-10-CM | POA: Insufficient documentation

## 2021-09-26 DIAGNOSIS — C50811 Malignant neoplasm of overlapping sites of right female breast: Secondary | ICD-10-CM

## 2021-09-26 MED ORDER — SODIUM CHLORIDE 0.9 % IV SOLN: INTRAVENOUS | Status: DC

## 2021-09-26 MED ORDER — SODIUM CHLORIDE 0.9 % IV SOLN
510.0000 mg | Freq: Once | INTRAVENOUS | Status: AC
  Administered 2021-09-26: 510 mg via INTRAVENOUS
  Filled 2021-09-26: qty 510

## 2021-09-26 MED ORDER — FAMOTIDINE IN NACL 20-0.9 MG/50ML-% IV SOLN
20.0000 mg | Freq: Once | INTRAVENOUS | Status: AC
  Administered 2021-09-26: 20 mg via INTRAVENOUS
  Filled 2021-09-26: qty 50

## 2021-09-26 NOTE — Patient Instructions (Signed)

## 2021-09-27 DIAGNOSIS — C50811 Malignant neoplasm of overlapping sites of right female breast: Secondary | ICD-10-CM | POA: Diagnosis not present

## 2021-09-28 ENCOUNTER — Other Ambulatory Visit: Payer: Self-pay | Admitting: Family Medicine

## 2021-09-28 DIAGNOSIS — F329 Major depressive disorder, single episode, unspecified: Secondary | ICD-10-CM

## 2021-09-28 DIAGNOSIS — C50811 Malignant neoplasm of overlapping sites of right female breast: Secondary | ICD-10-CM | POA: Diagnosis not present

## 2021-09-29 ENCOUNTER — Other Ambulatory Visit: Payer: Self-pay | Admitting: *Deleted

## 2021-09-29 DIAGNOSIS — C799 Secondary malignant neoplasm of unspecified site: Secondary | ICD-10-CM

## 2021-09-29 DIAGNOSIS — C50911 Malignant neoplasm of unspecified site of right female breast: Secondary | ICD-10-CM

## 2021-09-29 DIAGNOSIS — C50811 Malignant neoplasm of overlapping sites of right female breast: Secondary | ICD-10-CM | POA: Diagnosis not present

## 2021-09-29 DIAGNOSIS — C7801 Secondary malignant neoplasm of right lung: Secondary | ICD-10-CM

## 2021-09-29 DIAGNOSIS — C7951 Secondary malignant neoplasm of bone: Secondary | ICD-10-CM

## 2021-09-29 MED ORDER — FOLIC ACID 1 MG PO TABS
1.0000 mg | ORAL_TABLET | Freq: Every day | ORAL | 4 refills | Status: DC
Start: 2021-09-29 — End: 2022-12-20

## 2021-09-29 MED ORDER — FUROSEMIDE 20 MG PO TABS: 20.0000 mg | ORAL_TABLET | Freq: Every day | ORAL | 4 refills | Status: DC

## 2021-09-29 MED ORDER — ANASTROZOLE 1 MG PO TABS: 1.0000 mg | ORAL_TABLET | Freq: Every day | ORAL | 4 refills | Status: DC

## 2021-09-29 MED ORDER — CYANOCOBALAMIN 2000 MCG PO TABS
2000.0000 ug | ORAL_TABLET | Freq: Every day | ORAL | 4 refills | Status: DC
Start: 2021-09-29 — End: 2021-12-06

## 2021-10-02 DIAGNOSIS — C50811 Malignant neoplasm of overlapping sites of right female breast: Secondary | ICD-10-CM | POA: Diagnosis not present

## 2021-10-03 DIAGNOSIS — C50811 Malignant neoplasm of overlapping sites of right female breast: Secondary | ICD-10-CM | POA: Diagnosis not present

## 2021-10-04 ENCOUNTER — Other Ambulatory Visit (HOSPITAL_COMMUNITY): Payer: Self-pay

## 2021-10-05 DIAGNOSIS — C50811 Malignant neoplasm of overlapping sites of right female breast: Secondary | ICD-10-CM | POA: Diagnosis not present

## 2021-10-06 ENCOUNTER — Ambulatory Visit: Payer: Medicaid Other | Admitting: Nurse Practitioner

## 2021-10-06 DIAGNOSIS — C50811 Malignant neoplasm of overlapping sites of right female breast: Secondary | ICD-10-CM | POA: Diagnosis not present

## 2021-10-09 DIAGNOSIS — C50811 Malignant neoplasm of overlapping sites of right female breast: Secondary | ICD-10-CM | POA: Diagnosis not present

## 2021-10-10 DIAGNOSIS — C50811 Malignant neoplasm of overlapping sites of right female breast: Secondary | ICD-10-CM | POA: Diagnosis not present

## 2021-10-11 DIAGNOSIS — C50811 Malignant neoplasm of overlapping sites of right female breast: Secondary | ICD-10-CM | POA: Diagnosis not present

## 2021-10-12 DIAGNOSIS — C50811 Malignant neoplasm of overlapping sites of right female breast: Secondary | ICD-10-CM | POA: Diagnosis not present

## 2021-10-13 ENCOUNTER — Other Ambulatory Visit: Payer: Self-pay | Admitting: *Deleted

## 2021-10-13 DIAGNOSIS — C50811 Malignant neoplasm of overlapping sites of right female breast: Secondary | ICD-10-CM | POA: Diagnosis not present

## 2021-10-13 DIAGNOSIS — C7951 Secondary malignant neoplasm of bone: Secondary | ICD-10-CM

## 2021-10-13 DIAGNOSIS — J90 Pleural effusion, not elsewhere classified: Secondary | ICD-10-CM

## 2021-10-13 DIAGNOSIS — Z170 Estrogen receptor positive status [ER+]: Secondary | ICD-10-CM

## 2021-10-13 DIAGNOSIS — D518 Other vitamin B12 deficiency anemias: Secondary | ICD-10-CM

## 2021-10-16 ENCOUNTER — Inpatient Hospital Stay: Payer: Medicaid Other | Admitting: Hematology and Oncology

## 2021-10-16 ENCOUNTER — Other Ambulatory Visit: Payer: Self-pay

## 2021-10-16 ENCOUNTER — Inpatient Hospital Stay: Payer: Medicaid Other

## 2021-10-17 ENCOUNTER — Inpatient Hospital Stay: Payer: Medicaid Other

## 2021-10-17 ENCOUNTER — Other Ambulatory Visit: Payer: Self-pay

## 2021-10-17 VITALS — BP 118/75 | HR 92 | Temp 99.0°F | Resp 18

## 2021-10-17 DIAGNOSIS — Z170 Estrogen receptor positive status [ER+]: Secondary | ICD-10-CM

## 2021-10-17 DIAGNOSIS — C50811 Malignant neoplasm of overlapping sites of right female breast: Secondary | ICD-10-CM | POA: Diagnosis not present

## 2021-10-17 DIAGNOSIS — C7951 Secondary malignant neoplasm of bone: Secondary | ICD-10-CM

## 2021-10-17 DIAGNOSIS — D518 Other vitamin B12 deficiency anemias: Secondary | ICD-10-CM

## 2021-10-17 DIAGNOSIS — J90 Pleural effusion, not elsewhere classified: Secondary | ICD-10-CM

## 2021-10-17 LAB — CBC WITH DIFFERENTIAL (CANCER CENTER ONLY)
Abs Immature Granulocytes: 0.07 10*3/uL (ref 0.00–0.07)
Basophils Absolute: 0.1 10*3/uL (ref 0.0–0.1)
Basophils Relative: 3 %
Eosinophils Absolute: 0 10*3/uL (ref 0.0–0.5)
Eosinophils Relative: 1 %
HCT: 34.6 % — ABNORMAL LOW (ref 36.0–46.0)
Hemoglobin: 11.7 g/dL — ABNORMAL LOW (ref 12.0–15.0)
Immature Granulocytes: 2 %
Lymphocytes Relative: 22 %
Lymphs Abs: 0.7 10*3/uL (ref 0.7–4.0)
MCH: 37.9 pg — ABNORMAL HIGH (ref 26.0–34.0)
MCHC: 33.8 g/dL (ref 30.0–36.0)
MCV: 112 fL — ABNORMAL HIGH (ref 80.0–100.0)
Monocytes Absolute: 0.2 10*3/uL (ref 0.1–1.0)
Monocytes Relative: 7 %
Neutro Abs: 2 10*3/uL (ref 1.7–7.7)
Neutrophils Relative %: 65 %
Platelet Count: 309 10*3/uL (ref 150–400)
RBC: 3.09 MIL/uL — ABNORMAL LOW (ref 3.87–5.11)
RDW: 17.5 % — ABNORMAL HIGH (ref 11.5–15.5)
Smear Review: NORMAL
WBC Count: 3.1 10*3/uL — ABNORMAL LOW (ref 4.0–10.5)
nRBC: 0 % (ref 0.0–0.2)

## 2021-10-17 LAB — CMP (CANCER CENTER ONLY)
ALT: 12 U/L (ref 0–44)
AST: 18 U/L (ref 15–41)
Albumin: 4 g/dL (ref 3.5–5.0)
Alkaline Phosphatase: 116 U/L (ref 38–126)
Anion gap: 7 (ref 5–15)
BUN: 19 mg/dL (ref 8–23)
CO2: 29 mmol/L (ref 22–32)
Calcium: 9.8 mg/dL (ref 8.9–10.3)
Chloride: 104 mmol/L (ref 98–111)
Creatinine: 1.12 mg/dL — ABNORMAL HIGH (ref 0.44–1.00)
GFR, Estimated: 56 mL/min — ABNORMAL LOW (ref >60)
Glucose, Bld: 123 mg/dL — ABNORMAL HIGH (ref 70–99)
Potassium: 3.7 mmol/L (ref 3.5–5.1)
Sodium: 140 mmol/L (ref 135–145)
Total Bilirubin: 0.6 mg/dL (ref 0.3–1.2)
Total Protein: 7.4 g/dL (ref 6.5–8.1)

## 2021-10-17 LAB — VITAMIN B12: Vitamin B-12: 689 pg/mL (ref 180–914)

## 2021-10-17 LAB — FERRITIN: Ferritin: 166 ng/mL (ref 11–307)

## 2021-10-17 MED ORDER — DENOSUMAB 120 MG/1.7ML ~~LOC~~ SOLN
120.0000 mg | Freq: Once | SUBCUTANEOUS | Status: AC
  Administered 2021-10-17: 120 mg via SUBCUTANEOUS
  Filled 2021-10-17: qty 1.7

## 2021-10-17 NOTE — Patient Instructions (Signed)
Denosumab injection What is this medication? DENOSUMAB (den oh sue mab) slows bone breakdown. Prolia is used to treat osteoporosis in women after menopause and in men, and in people who are taking corticosteroids for 6 months or more. Xgeva is used to treat a high calcium level due to cancer and to prevent bone fractures and other bone problems caused by multiple myeloma or cancer bone metastases. Xgeva is also used to treat giant cell tumor of the bone. This medicine may be used for other purposes; ask your health care provider or pharmacist if you have questions. COMMON BRAND NAME(S): Prolia, XGEVA What should I tell my care team before I take this medication? They need to know if you have any of these conditions: dental disease having surgery or tooth extraction infection kidney disease low levels of calcium or Vitamin D in the blood malnutrition on hemodialysis skin conditions or sensitivity thyroid or parathyroid disease an unusual reaction to denosumab, other medicines, foods, dyes, or preservatives pregnant or trying to get pregnant breast-feeding How should I use this medication? This medicine is for injection under the skin. It is given by a health care professional in a hospital or clinic setting. A special MedGuide will be given to you before each treatment. Be sure to read this information carefully each time. For Prolia, talk to your pediatrician regarding the use of this medicine in children. Special care may be needed. For Xgeva, talk to your pediatrician regarding the use of this medicine in children. While this drug may be prescribed for children as young as 13 years for selected conditions, precautions do apply. Overdosage: If you think you have taken too much of this medicine contact a poison control center or emergency room at once. NOTE: This medicine is only for you. Do not share this medicine with others. What if I miss a dose? It is important not to miss your dose.  Call your doctor or health care professional if you are unable to keep an appointment. What may interact with this medication? Do not take this medicine with any of the following medications: other medicines containing denosumab This medicine may also interact with the following medications: medicines that lower your chance of fighting infection steroid medicines like prednisone or cortisone This list may not describe all possible interactions. Give your health care provider a list of all the medicines, herbs, non-prescription drugs, or dietary supplements you use. Also tell them if you smoke, drink alcohol, or use illegal drugs. Some items may interact with your medicine. What should I watch for while using this medication? Visit your doctor or health care professional for regular checks on your progress. Your doctor or health care professional may order blood tests and other tests to see how you are doing. Call your doctor or health care professional for advice if you get a fever, chills or sore throat, or other symptoms of a cold or flu. Do not treat yourself. This drug may decrease your body's ability to fight infection. Try to avoid being around people who are sick. You should make sure you get enough calcium and vitamin D while you are taking this medicine, unless your doctor tells you not to. Discuss the foods you eat and the vitamins you take with your health care professional. See your dentist regularly. Brush and floss your teeth as directed. Before you have any dental work done, tell your dentist you are receiving this medicine. Do not become pregnant while taking this medicine or for 5 months after   stopping it. Talk with your doctor or health care professional about your birth control options while taking this medicine. Women should inform their doctor if they wish to become pregnant or think they might be pregnant. There is a potential for serious side effects to an unborn child. Talk to  your health care professional or pharmacist for more information. What side effects may I notice from receiving this medication? Side effects that you should report to your doctor or health care professional as soon as possible: allergic reactions like skin rash, itching or hives, swelling of the face, lips, or tongue bone pain breathing problems dizziness jaw pain, especially after dental work redness, blistering, peeling of the skin signs and symptoms of infection like fever or chills; cough; sore throat; pain or trouble passing urine signs of low calcium like fast heartbeat, muscle cramps or muscle pain; pain, tingling, numbness in the hands or feet; seizures unusual bleeding or bruising unusually weak or tired Side effects that usually do not require medical attention (report to your doctor or health care professional if they continue or are bothersome): constipation diarrhea headache joint pain loss of appetite muscle pain runny nose tiredness upset stomach This list may not describe all possible side effects. Call your doctor for medical advice about side effects. You may report side effects to FDA at 1-800-FDA-1088. Where should I keep my medication? This medicine is only given in a clinic, doctor's office, or other health care setting and will not be stored at home. NOTE: This sheet is a summary. It may not cover all possible information. If you have questions about this medicine, talk to your doctor, pharmacist, or health care provider.  2023 Elsevier/Gold Standard (2017-09-20 00:00:00)  

## 2021-10-18 DIAGNOSIS — C50811 Malignant neoplasm of overlapping sites of right female breast: Secondary | ICD-10-CM | POA: Diagnosis not present

## 2021-10-18 LAB — CANCER ANTIGEN 27.29: CA 27.29: 223.8 U/mL — ABNORMAL HIGH (ref 0.0–38.6)

## 2021-10-19 DIAGNOSIS — C50811 Malignant neoplasm of overlapping sites of right female breast: Secondary | ICD-10-CM | POA: Diagnosis not present

## 2021-10-20 DIAGNOSIS — C50811 Malignant neoplasm of overlapping sites of right female breast: Secondary | ICD-10-CM | POA: Diagnosis not present

## 2021-10-25 ENCOUNTER — Other Ambulatory Visit (HOSPITAL_COMMUNITY): Payer: Self-pay

## 2021-10-25 ENCOUNTER — Telehealth: Payer: Self-pay | Admitting: Hematology and Oncology

## 2021-10-25 NOTE — Telephone Encounter (Signed)
Scheduled appointment per provider. Left message with appointment details.  

## 2021-10-26 DIAGNOSIS — J9621 Acute and chronic respiratory failure with hypoxia: Secondary | ICD-10-CM | POA: Diagnosis not present

## 2021-10-26 DIAGNOSIS — C50811 Malignant neoplasm of overlapping sites of right female breast: Secondary | ICD-10-CM | POA: Diagnosis not present

## 2021-10-27 ENCOUNTER — Other Ambulatory Visit (HOSPITAL_COMMUNITY): Payer: Self-pay

## 2021-10-27 DIAGNOSIS — C50811 Malignant neoplasm of overlapping sites of right female breast: Secondary | ICD-10-CM | POA: Diagnosis not present

## 2021-10-30 ENCOUNTER — Other Ambulatory Visit (HOSPITAL_COMMUNITY): Payer: Self-pay

## 2021-10-31 ENCOUNTER — Other Ambulatory Visit (HOSPITAL_COMMUNITY): Payer: Self-pay

## 2021-10-31 DIAGNOSIS — C50811 Malignant neoplasm of overlapping sites of right female breast: Secondary | ICD-10-CM | POA: Diagnosis not present

## 2021-11-01 ENCOUNTER — Ambulatory Visit: Payer: Medicaid Other

## 2021-11-01 ENCOUNTER — Other Ambulatory Visit: Payer: Self-pay

## 2021-11-01 DIAGNOSIS — C50811 Malignant neoplasm of overlapping sites of right female breast: Secondary | ICD-10-CM | POA: Diagnosis not present

## 2021-11-02 DIAGNOSIS — C50811 Malignant neoplasm of overlapping sites of right female breast: Secondary | ICD-10-CM | POA: Diagnosis not present

## 2021-11-03 DIAGNOSIS — C50811 Malignant neoplasm of overlapping sites of right female breast: Secondary | ICD-10-CM | POA: Diagnosis not present

## 2021-11-06 DIAGNOSIS — C50811 Malignant neoplasm of overlapping sites of right female breast: Secondary | ICD-10-CM | POA: Diagnosis not present

## 2021-11-07 DIAGNOSIS — C50811 Malignant neoplasm of overlapping sites of right female breast: Secondary | ICD-10-CM | POA: Diagnosis not present

## 2021-11-08 ENCOUNTER — Other Ambulatory Visit: Payer: Self-pay | Admitting: *Deleted

## 2021-11-08 DIAGNOSIS — C50811 Malignant neoplasm of overlapping sites of right female breast: Secondary | ICD-10-CM | POA: Diagnosis not present

## 2021-11-08 NOTE — Patient Outreach (Signed)
Medicaid Managed Care   Nurse Care Manager Note  11/08/2021 Name:  Suzanne [name] MRN:  [id number] DOB:  02-10-1961  Suzanne [name] is an 61 y.o. year old female who is a primary patient of [name], NP.  The [facility] Managed Care Coordination team was consulted for assistance with:    Health Maintenance  Suzanne [name] was given information about Medicaid Managed Care Coordination team services today. [name] Patient agreed to services and verbal consent obtained.  Engaged with patient by telephone for follow up visit in response to provider referral for case management and/or care coordination services.   Assessments/Interventions:  Review of past medical history, allergies, medications, health status, including review of consultants reports, laboratory and other test data, was performed as part of comprehensive evaluation and provision of chronic care management services.  SDOH (Social Determinants of Health) assessments and interventions performed: SDOH Interventions    Flowsheet Row Most Recent Value  SDOH Interventions   Food Insecurity Interventions Intervention Not Indicated  Stress Interventions Patient Refused       Care Plan  Allergies  Allergen Reactions   Ferumoxytol Other (See Comments)    Pt had hypersensitivity to Ferumoxytol. She had Facial Flushing. See note from 07/14/2021    Medications Reviewed Today     Reviewed by Melissa Montane, RN (Registered Nurse) on 11/08/21 at 1454  Med List Status: <None>   Medication Order Taking? Sig Documenting Provider Last Dose Status Informant  amoxicillin-clavulanate (AUGMENTIN) 875-125 MG tablet [id number] No Take 1 tablet by mouth 2 (two) times daily.  Patient not taking: Reported on 08/16/2021   Benay Pike, MD Not Taking Active            Med Note (Mindel Friscia A   Wed Aug 16, 2021 11:28 AM) completed  anastrozole (ARIMIDEX) 1 MG tablet [id number] Yes Take 1 tablet (1 mg total) by mouth daily. Benay Pike, MD Taking Active   antiseptic oral rinse (BIOTENE) LIQD [id number] No 15 mLs by Mouth Rinse route as needed for dry mouth.  Patient not taking: Reported on 07/14/2020   [provider] Not Taking Active   Blood Pressure Monitor MISC [id number]  Medium sized cuff. Use to check blood pressure twice per day and more often if not feeling well. I10.0 [name], NP  Active   calcium-vitamin D (OSCAL WITH D) 500-200 MG-UNIT tablet [id number]  Take 1 tablet by mouth 3 (three) times daily.  Patient not taking: Reported on 08/16/2021   MagrinatVirgie Dad, MD  Active            Med Note (Hartlee Amedee A   Mon Jun 05, 2021  1:19 PM) Taking twice daily  cetirizine (ALLERGY RELIEF CETIRIZINE) 10 MG tablet [id number] Yes Take 1 tablet (10 mg total) by mouth daily. [name], NP Taking Active   cyanocobalamin ([facility] VITAMIN B12) 2000 MCG tablet [id number] No Take 1 tablet (2,000 mcg total) by mouth daily.  Patient not taking: Reported on 11/08/2021   Benay Pike, MD Not Taking Active   famotidine (PEPCID) 20 MG tablet [id number] Yes Take 1 tablet (20 mg total) by mouth 2 (two) times daily as needed for heartburn or indigestion. [name], Virgie Dad, MD Taking Active   folic acid (FOLVITE) 1 MG tablet [id number] Yes Take 1 tablet (1 mg total) by mouth daily. Benay Pike, MD Taking Active   furosemide (LASIX) 20 MG tablet [id number] Yes Take 1 tablet (20 mg total) by mouth  daily. Benay Pike, MD Taking Active   ketoconazole (NIZORAL) 2 % cream [id number] No APPLY TO THE AFFECTED AREA(S) DAILY AS NEEDED  Patient not taking: Reported on 06/05/2021   [name], Virgie Dad, MD Not Taking Active   nystatin (MYCOSTATIN/NYSTOP) powder [id number] No Apply 1 application topically as needed.  Patient not taking: Reported on 06/05/2021   Gardenia Phlegm, NP Not Taking Active   OYSTER SHELL CALCIUM PLUS D 500-5 MG-MCG TABS [id number] Yes TAKE ONE TABLET BY MOUTH EVERY MORNING and TAKE ONE TABLET  BY MOUTH AT NOON and TAKE ONE TABLET BY MOUTH EVERY EVENING [name], Virgie Dad, MD Taking Active            Med Note (Chrissi Crow A   Mon Jun 05, 2021  1:21 PM) Taking twice daily  palbociclib (IBRANCE) 75 MG tablet [id number] Yes TAKE 1 TABLET (75 MG TOTAL) BY MOUTH DAILY. TAKE FOR 21 DAYS ON, 7 DAYS OFF, REPEAT EVERY 28 DAYS Iruku, Praveena, MD Taking Active   polyethylene glycol (MIRALAX / GLYCOLAX) 17 g packet [id number] Yes Take 17 g by mouth as needed. [name], Virgie Dad, MD Taking Active   sertraline (ZOLOFT) 50 MG tablet [id number] Yes TAKE 1 AND 1/2 TABLETS BY MOUTH EVERYDAY AT BEDTIME [name], NP Taking Active   ZOFRAN 8 MG tablet [id number] No Take 1 tablet (8 mg total) by mouth every 8 (eight) hours.  Patient not taking: Reported on 11/08/2021   Gardenia Phlegm, NP Not Taking Active   Med List Note [name], RPH-CPP 11/13/19 [id number]): [name] filled at [place]            Patient Active Problem List   Diagnosis Date Noted   B12 deficiency anemia 08/15/2020   Intractable back pain 02/03/2019   Thoracic compression fracture, closed, initial encounter ([place]) 02/03/2019   Essential hypertension 02/03/2019   Genetic testing 01/27/2019   Acute pharyngitis    Bacteremia    FTT (failure to thrive) in adult    Fracture closed, humerus, shaft 01/09/2019   Pneumonia 12/25/2018   Acute respiratory disorder in immunocompromised patient ([place]) 12/25/2018   Family history of breast cancer    Family history of prostate cancer    Family history of melanoma    Family history of colon cancer    Pressure injury of skin 11/24/2018   Chronic respiratory failure with hypoxia and hypercapnia ([place]) 11/19/2018   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   Hypophosphatemia 11/19/2018   Hypocalcemia 11/19/2018   Aspiration pneumonia (HCC) 11/19/2018   SOB (shortness of breath)    Palliative care by specialist    Carcinoma of breast, estrogen receptor positive,  stage 4, right ([place]) 11/16/2018   Hypoalbuminemia 11/16/2018   Pleural effusion 11/14/2018   Goals of care, counseling/discussion 11/06/2018   Malignant neoplasm of overlapping sites of right breast in female, estrogen receptor positive ([place]) 10/31/2018   Lung metastasis 10/31/2018   Pain from bone metastases ([place]) 10/31/2018   Morbid obesity with BMI of 40.0-44.9, adult ([place]) 10/31/2018   Bone injury    Pleural effusion, bilateral    Shortness of breath    Abnormal breast finding    Hypokalemia    Breast skin changes    Macrocytic anemia    Malignant neoplasm metastatic to bone (HCC)    Anemia, B12 deficiency 10/26/2018    Conditions to be addressed/monitored per PCP order:   Health Maintenance  Care Plan : RN Care Manager Plan of Care  Updates made  by Melissa Montane, RN since 11/08/2021 12:00 AM     Problem: Health Management Coordination Needs      Long-Range Goal: Development of Plan of Care for needs related to Health Management   Start Date: 01/26/2021  Expected End Date: 12/25/2021  Priority: High  Note:   Current Barriers:  Chronic Disease Management support and education needs related to Health Maintenance-Ms. Odonoghue experienced unexpected loss of her nephew. She shared with RNCM that she misses her nephew very much and is working through the grieving process. She is having nausea episodes that wake her every night, unsure if it is from stress. Plans to discuss with provider on 11/14/21.    RNCM Clinical Goal(s):  Patient will verbalize understanding of plan for management of Health Maintenance attend all scheduled medical appointments: 11/14/21 with Oncology  continue to work with RN Care Manager to address care management and care coordination needs related to Health Maintenance  through collaboration with RN Care manager, provider, and care team.   Interventions: Inter-disciplinary care team collaboration (see longitudinal plan of care) Evaluation of current  treatment plan related to  self management and patient's adherence to plan as established by provider Provided therapeutic listening Discussed referral to LCSW for grief management-patient declines at this time, will let RNCM know if she changes her mind Advised patient to discuss any concerns or questions with provider on 11/14/21   Health Maintenance  (Status: Goal on Track (progressing): YES.) Evaluation of current treatment plan related to  Health Maintenance ,  self-management and patient's adherence to plan as established by provider. Discussed plans with patient for ongoing care management follow up and provided patient with direct contact information for care management team Reviewed scheduled/upcoming provider appointments including Iron infusion 11/14/21 with Oncology; Discussed plans with patient for ongoing care management follow up and provided patient with direct contact information for care management team; Reviewed medications-she continues to receive medications from [facility] and has contact information SDOH assessment  Patient Goals/Self-Care Activities: Patient will self administer medications as prescribed Patient will attend all scheduled provider appointments Patient will call pharmacy for medication refills Patient will call provider office for new concerns or questions Patient will work with BSW to address care coordination needs and will continue to work with the clinical team to address health care and disease management related needs.         Follow Up:  Patient agrees to Care Plan and Follow-up.  Plan: The Managed Medicaid care management team will reach out to the patient again over the next 30 days.  Date/time of next scheduled RN care management/care coordination outreach:  12/12/21 @ 2:30pm  [name] RN, BSN [place] RN Care Coordinator

## 2021-11-08 NOTE — Patient Instructions (Signed)
Visit Information  Suzanne [name] was given information about Medicaid Managed Care team care coordination services as a part of their [place] Medicaid benefit. Suzanne [name] verbally consented to engagement with the [facility] Managed Care team.   If you are experiencing a medical emergency, please call 911 or report to your local emergency department or urgent care.   If you have a non-emergency medical problem during routine business hours, please contact your provider's office and ask to speak with a nurse.   For questions related to your [facility], please call: [phone] or visit the homepage here: [email or web address]  If you would like to schedule transportation through your [facility], please call the following number at least 2 days in advance of your appointment: [phone].  Rides for urgent appointments can also be made after hours by calling Member Services.  Call the [facility] at [phone], at any time, 24 hours a day, 7 days a week. If you are in danger or need immediate medical attention call 911.  If you would like help to quit smoking, call 1-800-QUIT-NOW (228)245-8255) OR Espaol: 1-855-Djelo-Ya (9-381-017-5102) o para ms informacin haga clic aqu or Text READY to 200-400 to register via text  Suzanne [name],   Please see education materials related to managing loss provided by MyChart link.  Patient verbalizes understanding of instructions and care plan provided today and agrees to view in [place]. Active MyChart status and patient understanding of how to access instructions and care plan via MyChart confirmed with patient.     Telephone follow up appointment with Managed Medicaid care management team member scheduled for:12/12/21 @ 2:30pm  Suzanne Joiner RN, BSN [place] RN Care  Coordinator   Following is a copy of your plan of care:  Care Plan : RN Care Manager Plan of Care  Updates made by Suzanne Montane, RN since 11/08/2021 12:00 AM     Problem: Health Management Coordination Needs      Long-Range Goal: Development of Plan of Care for needs related to Health Management   Start Date: 01/26/2021  Expected End Date: 12/25/2021  Priority: High  Note:   Current Barriers:  Chronic Disease Management support and education needs related to Health Maintenance-Suzanne [name] experienced unexpected loss of her nephew. She shared with RNCM that she misses her nephew very much and is working through the grieving process. She is having nausea episodes that wake her every night, unsure if it is from stress. Plans to discuss with provider on 11/14/21.    RNCM Clinical Goal(s):  Patient will verbalize understanding of plan for management of Health Maintenance attend all scheduled medical appointments: 11/14/21 with Oncology  continue to work with RN Care Manager to address care management and care coordination needs related to Health Maintenance  through collaboration with RN Care manager, provider, and care team.   Interventions: Inter-disciplinary care team collaboration (see longitudinal plan of care) Evaluation of current treatment plan related to  self management and patient's adherence to plan as established by provider Provided therapeutic listening Discussed referral to LCSW for grief management-patient declines at this time, will let RNCM know if she changes her mind Advised patient to discuss any concerns or questions with provider on 11/14/21   Health Maintenance  (Status: Goal on Track (progressing): YES.) Evaluation of current treatment plan related to  Health Maintenance ,  self-management and patient's adherence to plan as established by provider.  Discussed plans with patient for ongoing care management follow up and provided patient with direct contact information  for care management team Reviewed scheduled/upcoming provider appointments including Iron infusion 11/14/21 with Oncology; Discussed plans with patient for ongoing care management follow up and provided patient with direct contact information for care management team; Reviewed medications-she continues to receive medications from [facility] and has contact information SDOH assessment  Patient Goals/Self-Care Activities: Patient will self administer medications as prescribed Patient will attend all scheduled provider appointments Patient will call pharmacy for medication refills Patient will call provider office for new concerns or questions Patient will work with BSW to address care coordination needs and will continue to work with the clinical team to address health care and disease management related needs.

## 2021-11-13 DIAGNOSIS — C50811 Malignant neoplasm of overlapping sites of right female breast: Secondary | ICD-10-CM | POA: Diagnosis not present

## 2021-11-14 ENCOUNTER — Inpatient Hospital Stay (HOSPITAL_BASED_OUTPATIENT_CLINIC_OR_DEPARTMENT_OTHER): Payer: Medicaid Other | Admitting: Hematology and Oncology

## 2021-11-14 ENCOUNTER — Inpatient Hospital Stay: Payer: Medicaid Other | Attending: Adult Health

## 2021-11-14 ENCOUNTER — Encounter: Payer: Self-pay | Admitting: Hematology and Oncology

## 2021-11-14 ENCOUNTER — Inpatient Hospital Stay: Payer: Medicaid Other

## 2021-11-14 ENCOUNTER — Other Ambulatory Visit: Payer: Self-pay

## 2021-11-14 VITALS — BP 131/70 | HR 89 | Temp 97.7°F | Resp 16 | Ht 59.0 in | Wt 185.0 lb

## 2021-11-14 DIAGNOSIS — C7951 Secondary malignant neoplasm of bone: Secondary | ICD-10-CM | POA: Insufficient documentation

## 2021-11-14 DIAGNOSIS — D518 Other vitamin B12 deficiency anemias: Secondary | ICD-10-CM

## 2021-11-14 DIAGNOSIS — Z170 Estrogen receptor positive status [ER+]: Secondary | ICD-10-CM

## 2021-11-14 DIAGNOSIS — C50911 Malignant neoplasm of unspecified site of right female breast: Secondary | ICD-10-CM | POA: Diagnosis present

## 2021-11-14 DIAGNOSIS — J90 Pleural effusion, not elsewhere classified: Secondary | ICD-10-CM

## 2021-11-14 DIAGNOSIS — C50811 Malignant neoplasm of overlapping sites of right female breast: Secondary | ICD-10-CM | POA: Diagnosis not present

## 2021-11-14 DIAGNOSIS — G893 Neoplasm related pain (acute) (chronic): Secondary | ICD-10-CM | POA: Diagnosis not present

## 2021-11-14 LAB — CBC WITH DIFFERENTIAL (CANCER CENTER ONLY)
Abs Immature Granulocytes: 0.01 10*3/uL (ref 0.00–0.07)
Basophils Absolute: 0.1 10*3/uL (ref 0.0–0.1)
Basophils Relative: 2 %
Eosinophils Absolute: 0 10*3/uL (ref 0.0–0.5)
Eosinophils Relative: 1 %
HCT: 30.4 % — ABNORMAL LOW (ref 36.0–46.0)
Hemoglobin: 10.3 g/dL — ABNORMAL LOW (ref 12.0–15.0)
Immature Granulocytes: 0 %
Lymphocytes Relative: 17 %
Lymphs Abs: 0.5 10*3/uL — ABNORMAL LOW (ref 0.7–4.0)
MCH: 38.1 pg — ABNORMAL HIGH (ref 26.0–34.0)
MCHC: 33.9 g/dL (ref 30.0–36.0)
MCV: 112.6 fL — ABNORMAL HIGH (ref 80.0–100.0)
Monocytes Absolute: 0.2 10*3/uL (ref 0.1–1.0)
Monocytes Relative: 6 %
Neutro Abs: 2.3 10*3/uL (ref 1.7–7.7)
Neutrophils Relative %: 74 %
Platelet Count: 306 10*3/uL (ref 150–400)
RBC: 2.7 MIL/uL — ABNORMAL LOW (ref 3.87–5.11)
RDW: 15 % (ref 11.5–15.5)
Smear Review: NORMAL
WBC Count: 3.1 10*3/uL — ABNORMAL LOW (ref 4.0–10.5)
nRBC: 0 % (ref 0.0–0.2)

## 2021-11-14 LAB — CMP (CANCER CENTER ONLY)
ALT: 11 U/L (ref 0–44)
AST: 20 U/L (ref 15–41)
Albumin: 3.9 g/dL (ref 3.5–5.0)
Alkaline Phosphatase: 104 U/L (ref 38–126)
Anion gap: 6 (ref 5–15)
BUN: 17 mg/dL (ref 8–23)
CO2: 32 mmol/L (ref 22–32)
Calcium: 9.7 mg/dL (ref 8.9–10.3)
Chloride: 103 mmol/L (ref 98–111)
Creatinine: 1.04 mg/dL — ABNORMAL HIGH (ref 0.44–1.00)
GFR, Estimated: >60 mL/min (ref >60)
Glucose, Bld: 102 mg/dL — ABNORMAL HIGH (ref 70–99)
Potassium: 3.6 mmol/L (ref 3.5–5.1)
Sodium: 141 mmol/L (ref 135–145)
Total Bilirubin: 0.4 mg/dL (ref 0.3–1.2)
Total Protein: 6.9 g/dL (ref 6.5–8.1)

## 2021-11-14 LAB — VITAMIN B12: Vitamin B-12: 872 pg/mL (ref 180–914)

## 2021-11-14 LAB — FERRITIN: Ferritin: 53 ng/mL (ref 11–307)

## 2021-11-14 MED ORDER — DENOSUMAB 120 MG/1.7ML ~~LOC~~ SOLN
120.0000 mg | Freq: Once | SUBCUTANEOUS | Status: AC
  Administered 2021-11-14: 120 mg via SUBCUTANEOUS
  Filled 2021-11-14: qty 1.7

## 2021-11-14 NOTE — Progress Notes (Signed)
[facility]  [phone] [phone]   ID: [name] DOB: 12/15/1960  MR#: [id number]  OIN#:[id number]  Patient Care Team: [name], NP as PCP - General (Nurse Practitioner) Netta Cedars, MD as Consulting Physician (Orthopedic Surgery) Melissa Montane, RN as Case Manager Ethelda Chick as Social Worker Benay Pike, MD as Consulting Physician (Hematology and Oncology) OTHER MD:  CHIEF COMPLAINT: Stage IV estrogen receptor positive breast cancer  CURRENT TREATMENT: Anastrozole; Palbociclib; Xgeva  INTERVAL HISTORY:  Patient is here for follow-up by herself.   Last imaging with stable exam, no new or progressive findings in the chest abdomen pelvis.  Diffuse bony metastatic involvement similar to prior.  She continues on Ibrance and anastrozole as well as Xgeva for bony metastasis. She denies any new complaints except for some nausea in the middle of the night. No new bone pains. No change in breathing, bowel habits or urinary habits. Rest of the pertinent 10 point ROS reviewed and negative.  Lab Results  Component Value Date   CA2729 223.8 (H) 10/17/2021   CA2729 169.7 (H) 09/19/2021   CA2729 158.3 (H) 08/22/2021   CA2729 131.8 (H) 07/25/2021   CA2729 101.1 (H) 06/27/2021      COVID 19 VACCINATION STATUS: Status post [place] x2 with booster December 2021   HISTORY OF CURRENT ILLNESS: From the original inpatient consult note:  [name] is a 61 year old female from [place], [place] without significant past medical history with exception of obesity, remote history of gallstones, and remote history of mastitis.  The patient presented to the hospital on 10/26/2018 with a 3-week history of worsening shortness of breath and lower extremity edema.  The patient reported that her shortness of breath worsened with exertion.  Her lower extremity edema did not improve with elevation.  She also had an intermittent pr oductive  cough with rare sputum production.  The patient was noted to be anemic with a hemoglobin of 4.5 with a clumped platelets noted on admission.  Stool for occult blood was positive.  Patient received 2 units of packed red blood cells.    The patient reported a rash under her breast x1 week which has been foul-smelling.  She also reported that she has some discomfort and thickening/hardness of her right breast for several months.  She states that her right breast is much larger than her left.  She notes some occasional bloody discharge from her breast once in a while.  A chest x-ray on admission showed diffuse bilateral interstitial opacity of unknown chronicity, findings could be related to interstitial inflammatory process, atypical/viral pneumonia, or possible metastatic disease.  There are also sclerotic and lytic lesions within the clavicles, bilateral ribs, shoulders which was concerning for diffuse few skeletal metastatic disease.    This prompted a CT scan of the chest with contrast which showed bilateral axillary and mediastinal adenopathy, borderline hilar lymph nodes bilaterally, findings likely flecked metastatic lymphadenopathy, moderate bilateral pleural effusions, extensive interstitial thickening and reticulonodular opacities throughout the lungs, cannot exclude lymphangitic spread of tumor, diffuse skeletal metastases.  The patient's subsequent history is as detailed below.   PAST MEDICAL HISTORY: Past Medical History:  Diagnosis Date   Cancer [facility])    Family history of breast cancer    Family history of colon cancer    Family history of melanoma    Family history of prostate cancer    Gallstones    Glaucoma    Mastitis    reports history of  recurrent mastitis   Metastatic breast cancer    Obesity     PAST SURGICAL HISTORY: Past Surgical History:  Procedure Laterality Date   GLAUCOMA SURGERY  late 1990's   IR THORACENTESIS ASP PLEURAL SPACE W/IMG GUIDE  10/28/2018   IR  THORACENTESIS ASP PLEURAL SPACE W/IMG GUIDE  10/29/2018   OPEN REDUCTION INTERNAL FIXATION (ORIF) DISTAL RADIAL FRACTURE Left 01/09/2019   Procedure: OPEN REDUCTION INTERNAL FIXATION (ORIF) HUMERAL FRACTURE;  Surgeon: Netta Cedars, MD;  Location: [facility];  Service: Orthopedics;  Laterality: Left;    FAMILY HISTORY: Family History  Problem Relation Age of Onset   Breast cancer Sister        in her 30's-70s   Heart disease Brother        CABG   Heart disease Brother        CABG   Skin cancer Brother        melanoma   Colon cancer Cousin    Heart attack Mother    Heart attack Father    Prostate cancer Brother   Patient's father passed away at age 73, and her mother at 52, both from heart attacks. The patient has 8 siblings, 6 brothers and 1 sister.  Her sister was diagnosed with breast cancer. One brother has prostate cancer, another brother had a melanoma removed.    GYNECOLOGIC HISTORY:  Menarche: 61 years old Age at first live birth: 61 years old Suzanne [name] 1 LMP age 81 Contraceptive: used for 2-3 years with no problems HRT never used  Hysterectomy? no   SOCIAL HISTORY:  Seven worked for an Engineer, manufacturing systems. She is now on disability. She is widowed. She lives at home with her daughter.  [name], 37, who is a Psychologist, occupational at [employer] center.  The patient has no grandchildren. She is not a Ambulance person.   ADVANCED DIRECTIVES: not on file; she has the paperwork already. She intends to name her daughter as her HCPOA.   HEALTH MAINTENANCE: Social History   Tobacco Use   Smoking status: Never   Smokeless tobacco: Never   Tobacco comments:    second hand smoke exposure  Vaping Use   Vaping Use: Never used  Substance Use Topics   Alcohol use: No   Drug use: No    Colonoscopy:   PAP:   Bone density:  Mammography:   Allergies  Allergen Reactions   Ferumoxytol Other (See Comments)    Pt had hypersensitivity to Ferumoxytol. She had Facial Flushing. See note from  07/14/2021    Current Outpatient Medications  Medication Sig Dispense Refill   amoxicillin-clavulanate (AUGMENTIN) 875-125 MG tablet Take 1 tablet by mouth 2 (two) times daily. (Patient not taking: Reported on 08/16/2021) 14 tablet 0   anastrozole (ARIMIDEX) 1 MG tablet Take 1 tablet (1 mg total) by mouth daily. 90 tablet 4   antiseptic oral rinse (BIOTENE) LIQD 15 mLs by Mouth Rinse route as needed for dry mouth. (Patient not taking: Reported on 07/14/2020)     Blood Pressure Monitor MISC Medium sized cuff. Use to check blood pressure twice per day and more often if not feeling well. I10.0 1 each 0   calcium-vitamin D (OSCAL WITH D) 500-200 MG-UNIT tablet Take 1 tablet by mouth 3 (three) times daily. (Patient not taking: Reported on 08/16/2021) 90 tablet 1   cetirizine (ALLERGY RELIEF CETIRIZINE) 10 MG tablet Take 1 tablet (10 mg total) by mouth daily. 90 tablet 3   cyanocobalamin ([employer] VITAMIN B12) 2000 MCG  tablet Take 1 tablet (2,000 mcg total) by mouth daily. (Patient not taking: Reported on 11/08/2021) 90 tablet 4   famotidine (PEPCID) 20 MG tablet Take 1 tablet (20 mg total) by mouth 2 (two) times daily as needed for heartburn or indigestion. 30 tablet 3   folic acid (FOLVITE) 1 MG tablet Take 1 tablet (1 mg total) by mouth daily. 90 tablet 4   furosemide (LASIX) 20 MG tablet Take 1 tablet (20 mg total) by mouth daily. 90 tablet 4   ketoconazole (NIZORAL) 2 % cream APPLY TO THE AFFECTED AREA(S) DAILY AS NEEDED (Patient not taking: Reported on 06/05/2021) 60 g 6   nystatin (MYCOSTATIN/NYSTOP) powder Apply 1 application topically as needed. (Patient not taking: Reported on 06/05/2021) 15 g 1   OYSTER SHELL CALCIUM PLUS D 500-5 MG-MCG TABS TAKE ONE TABLET BY MOUTH EVERY MORNING and TAKE ONE TABLET BY MOUTH AT NOON and TAKE ONE TABLET BY MOUTH EVERY EVENING 90 tablet 1   palbociclib (IBRANCE) 75 MG tablet TAKE 1 TABLET (75 MG TOTAL) BY MOUTH DAILY. TAKE FOR 21 DAYS ON, 7 DAYS OFF, REPEAT EVERY 28 DAYS 21  tablet 6   polyethylene glycol (MIRALAX / GLYCOLAX) 17 g packet Take 17 g by mouth as needed. 14 each 6   sertraline (ZOLOFT) 50 MG tablet TAKE 1 AND 1/2 TABLETS BY MOUTH EVERYDAY AT BEDTIME 135 tablet 0   ZOFRAN 8 MG tablet Take 1 tablet (8 mg total) by mouth every 8 (eight) hours. (Patient not taking: Reported on 11/08/2021) 20 tablet 1   No current facility-administered medications for this visit.    OBJECTIVE: White woman examined in a wheelchair  Vitals:   11/14/21 1455  BP: 131/70  Pulse: 89  Resp: 16  Temp: 97.7 F (36.5 C)  SpO2: 100%       Body mass index is 37.37 kg/m.   Wt Readings from Last 3 Encounters:  11/14/21 185 lb (83.9 kg)  09/19/21 186 lb 4.8 oz (84.5 kg)  08/22/21 186 lb 8 oz (84.6 kg)   Physical Exam Constitutional:      Appearance: Normal appearance.  Cardiovascular:     Rate and Rhythm: Normal rate and regular rhythm.  Pulmonary:     Effort: Pulmonary effort is normal.     Breath sounds: Normal breath sounds.  Chest:     Comments: Breast exam deferred today Abdominal:     General: Abdomen is flat.  Musculoskeletal:        General: No swelling or tenderness.     Cervical back: Normal range of motion and neck supple. No rigidity.  Lymphadenopathy:     Cervical: No cervical adenopathy.  Neurological:     General: No focal deficit present.     Mental Status: She is alert.  Psychiatric:        Mood and Affect: Mood normal.      LAB RESULTS:  CMP     Component Value Date/Time   NA 141 11/14/2021 1335   K 3.6 11/14/2021 1335   CL 103 11/14/2021 1335   CO2 32 11/14/2021 1335   GLUCOSE 102 (H) 11/14/2021 1335   BUN 17 11/14/2021 1335   CREATININE 1.04 (H) 11/14/2021 1335   CALCIUM 9.7 11/14/2021 1335   PROT 6.9 11/14/2021 1335   ALBUMIN 3.9 11/14/2021 1335   AST 20 11/14/2021 1335   ALT 11 11/14/2021 1335   ALKPHOS 104 11/14/2021 1335   BILITOT 0.4 11/14/2021 1335   GFRNONAA >60 11/14/2021 1335  GFRAA >60 02/24/2020 1454    GFRAA >60 07/24/2019 1453    Lab Results  Component Value Date   WBC 3.1 (L) 11/14/2021   NEUTROABS 2.3 11/14/2021   HGB 10.3 (L) 11/14/2021   HCT 30.4 (L) 11/14/2021   MCV 112.6 (H) 11/14/2021   PLT 306 11/14/2021      Lab Results  Component Value Date   IRON 77 07/14/2021   TIBC 444 07/14/2021   IRONPCTSAT 17 07/14/2021   (Iron and TIBC)  Lab Results  Component Value Date   FERRITIN 166 10/17/2021     ELIGIBLE FOR AVAILABLE RESEARCH PROTOCOL: no   ASSESSMENT: 61 y.o. [place], [place] woman presenting 10/26/2018 with right-sided inflammatory breast cancer, stage IV, involving lungs, lymph nodes and bones, as follows:  (a) chest CT scan 10/26/2018 shows bilateral pleural effusions, possible lymphangitic spread of tumor, diffuse bony metastatic disease, and significant axillary mediastinal and hilar adenopathy  (b) bone scan 10/27/2018 is a "near [place]" consistent with widespread bony metastatic disease  (c) head CT with and without contrast 10/30/2018 shows no intracranial metastatic disease, multiple calvarial lesions  (d) CA-27-29 on 10/27/2018 was 1810.4  (1) pleural fluid from right thoracentesis 10/28/2018 confirms malignant cells consistent with a breast primary, strongly estrogen and progesterone receptor positive, HER-2 not amplified, with an MIB-1 of 2%  (2) anastrozole started 10/29/2018  (a) palbociclib started 11/13/2018 at 100 mg dose  (b) denosumab/xgeva started 11/13/2018   (C) palbociclib dose started  (3) associated problems:  (a) hypoxia secondary to effusions  (b) pain from bone lesions and left humeral and vertebral compression fractures  (c) right upper extremity lymphedema  (d) poor venous access  (4) genetics testing on 12/25/2018 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 STK11, TP53, TSC1, TSC2, and VHL.  The following genes were evaluated for sequence changes only: SDHA and HOXB13 c.251G>A variant only. The [facility] Melanoma Panel analyzed the following 9 genes: BAP1 BRCA2 CDK4 CDKN2A MITF POT1 PTEN RB1 Tp53.   (5) palliative radiation to the left humerus 07/20/2019 - 07/31/2019 Site Technique Total Dose (Gy) Dose per Fx (Gy) Completed Fx Beam Energies  Humerus, Left: Ext_Lt_humerus Complex 30/30 3 10/10 6X   (6) anemia requiring transfusion:   (a) B-12 level 134 FEB 2022: B-12 injectons started   (B) iron deficient, IV iron to start 2/6  PLAN:  [place] is here for follow-up of her metastatic breast cancer.   She continues on Ibrance and anastrozole as well as Xgeva monthly. Last imaging with stable disease.  Labs from today show hemoglobin of 10.3, leukopenia with no neutropenia.  Iron labs are pending from today.  She denies any new dental concerns.  Her tumor marker has slowly increased over the past couple months.  We will plan to repeat imaging in July, this has been ordered.  If she has no evidence of progression, we will continue the current treatment. If she does have evidence of progression, we will consider rebiopsy and send foundation 1 testing  plan for additional targeted therapy based on the mutation profile. Okay to continue  above-mentioned plan and return to clinic in 4 weeks  Return to clinic in 4 weeks. Total time spent: 30 minutes  *Total Encounter Time as defined by the Centers for Medicare and Medicaid Services includes, in addition to the face-to-face time of a patient visit  (documented in the note above) non-face-to-face time: obtaining and reviewing outside history, ordering and reviewing medications, tests or procedures, care coordination (communications with other health care professionals or caregivers) and documentation in the medical record.  Benay Pike MD

## 2021-11-15 ENCOUNTER — Telehealth: Payer: Self-pay | Admitting: Hematology and Oncology

## 2021-11-15 DIAGNOSIS — C50811 Malignant neoplasm of overlapping sites of right female breast: Secondary | ICD-10-CM | POA: Diagnosis not present

## 2021-11-15 LAB — CANCER ANTIGEN 27.29: CA 27.29: 213 U/mL — ABNORMAL HIGH (ref 0.0–38.6)

## 2021-11-15 NOTE — Telephone Encounter (Signed)
Scheduled appointment per 06/20 los. Left message.

## 2021-11-16 DIAGNOSIS — C50811 Malignant neoplasm of overlapping sites of right female breast: Secondary | ICD-10-CM | POA: Diagnosis not present

## 2021-11-17 DIAGNOSIS — C50811 Malignant neoplasm of overlapping sites of right female breast: Secondary | ICD-10-CM | POA: Diagnosis not present

## 2021-11-20 DIAGNOSIS — C50811 Malignant neoplasm of overlapping sites of right female breast: Secondary | ICD-10-CM | POA: Diagnosis not present

## 2021-11-21 ENCOUNTER — Other Ambulatory Visit (HOSPITAL_COMMUNITY): Payer: Self-pay

## 2021-11-22 DIAGNOSIS — C50811 Malignant neoplasm of overlapping sites of right female breast: Secondary | ICD-10-CM | POA: Diagnosis not present

## 2021-11-23 ENCOUNTER — Other Ambulatory Visit (HOSPITAL_COMMUNITY): Payer: Self-pay

## 2021-11-23 ENCOUNTER — Other Ambulatory Visit: Payer: Self-pay

## 2021-11-23 DIAGNOSIS — C50811 Malignant neoplasm of overlapping sites of right female breast: Secondary | ICD-10-CM | POA: Diagnosis not present

## 2021-11-24 ENCOUNTER — Other Ambulatory Visit (HOSPITAL_COMMUNITY): Payer: Self-pay

## 2021-11-25 DIAGNOSIS — S22000A Wedge compression fracture of unspecified thoracic vertebra, initial encounter for closed fracture: Secondary | ICD-10-CM | POA: Diagnosis not present

## 2021-11-25 DIAGNOSIS — R0602 Shortness of breath: Secondary | ICD-10-CM | POA: Diagnosis not present

## 2021-11-25 DIAGNOSIS — C7801 Secondary malignant neoplasm of right lung: Secondary | ICD-10-CM | POA: Diagnosis not present

## 2021-11-25 DIAGNOSIS — J9621 Acute and chronic respiratory failure with hypoxia: Secondary | ICD-10-CM | POA: Diagnosis not present

## 2021-11-27 DIAGNOSIS — C50811 Malignant neoplasm of overlapping sites of right female breast: Secondary | ICD-10-CM | POA: Diagnosis not present

## 2021-11-28 DIAGNOSIS — C50811 Malignant neoplasm of overlapping sites of right female breast: Secondary | ICD-10-CM | POA: Diagnosis not present

## 2021-11-29 ENCOUNTER — Ambulatory Visit (HOSPITAL_COMMUNITY): Payer: Medicaid Other

## 2021-11-30 DIAGNOSIS — C50811 Malignant neoplasm of overlapping sites of right female breast: Secondary | ICD-10-CM | POA: Diagnosis not present

## 2021-12-01 DIAGNOSIS — C50811 Malignant neoplasm of overlapping sites of right female breast: Secondary | ICD-10-CM | POA: Diagnosis not present

## 2021-12-04 DIAGNOSIS — C50811 Malignant neoplasm of overlapping sites of right female breast: Secondary | ICD-10-CM | POA: Diagnosis not present

## 2021-12-05 DIAGNOSIS — C50811 Malignant neoplasm of overlapping sites of right female breast: Secondary | ICD-10-CM | POA: Diagnosis not present

## 2021-12-06 ENCOUNTER — Telehealth: Payer: Self-pay | Admitting: *Deleted

## 2021-12-06 ENCOUNTER — Encounter (HOSPITAL_COMMUNITY): Payer: Self-pay

## 2021-12-06 ENCOUNTER — Other Ambulatory Visit: Payer: Self-pay

## 2021-12-06 ENCOUNTER — Inpatient Hospital Stay (HOSPITAL_COMMUNITY)
Admission: EM | Admit: 2021-12-06 | Discharge: 2021-12-10 | DRG: 872 | Disposition: A | Discharge status: Home / Self Care | Payer: Medicaid Other | Attending: Internal Medicine | Admitting: Internal Medicine

## 2021-12-06 ENCOUNTER — Emergency Department (HOSPITAL_COMMUNITY): Payer: Medicaid Other

## 2021-12-06 DIAGNOSIS — Z79899 Other long term (current) drug therapy: Secondary | ICD-10-CM

## 2021-12-06 DIAGNOSIS — C779 Secondary and unspecified malignant neoplasm of lymph node, unspecified: Secondary | ICD-10-CM | POA: Diagnosis present

## 2021-12-06 DIAGNOSIS — Z888 Allergy status to other drugs, medicaments and biological substances status: Secondary | ICD-10-CM | POA: Diagnosis not present

## 2021-12-06 DIAGNOSIS — D539 Nutritional anemia, unspecified: Secondary | ICD-10-CM | POA: Diagnosis present

## 2021-12-06 DIAGNOSIS — A409 Streptococcal sepsis, unspecified: Secondary | ICD-10-CM | POA: Diagnosis not present

## 2021-12-06 DIAGNOSIS — R234 Changes in skin texture: Secondary | ICD-10-CM | POA: Diagnosis present

## 2021-12-06 DIAGNOSIS — H409 Unspecified glaucoma: Secondary | ICD-10-CM | POA: Diagnosis not present

## 2021-12-06 DIAGNOSIS — A419 Sepsis, unspecified organism: Secondary | ICD-10-CM | POA: Diagnosis not present

## 2021-12-06 DIAGNOSIS — T451X5A Adverse effect of antineoplastic and immunosuppressive drugs, initial encounter: Secondary | ICD-10-CM | POA: Diagnosis not present

## 2021-12-06 DIAGNOSIS — L89156 Pressure-induced deep tissue damage of sacral region: Secondary | ICD-10-CM | POA: Diagnosis present

## 2021-12-06 DIAGNOSIS — Z79811 Long term (current) use of aromatase inhibitors: Secondary | ICD-10-CM | POA: Diagnosis not present

## 2021-12-06 DIAGNOSIS — Z170 Estrogen receptor positive status [ER+]: Secondary | ICD-10-CM

## 2021-12-06 DIAGNOSIS — F32A Depression, unspecified: Secondary | ICD-10-CM | POA: Diagnosis not present

## 2021-12-06 DIAGNOSIS — Z803 Family history of malignant neoplasm of breast: Secondary | ICD-10-CM | POA: Diagnosis not present

## 2021-12-06 DIAGNOSIS — L304 Erythema intertrigo: Secondary | ICD-10-CM | POA: Diagnosis present

## 2021-12-06 DIAGNOSIS — D701 Agranulocytosis secondary to cancer chemotherapy: Secondary | ICD-10-CM | POA: Diagnosis present

## 2021-12-06 DIAGNOSIS — C50911 Malignant neoplasm of unspecified site of right female breast: Secondary | ICD-10-CM

## 2021-12-06 DIAGNOSIS — C50811 Malignant neoplasm of overlapping sites of right female breast: Secondary | ICD-10-CM | POA: Diagnosis not present

## 2021-12-06 DIAGNOSIS — D696 Thrombocytopenia, unspecified: Secondary | ICD-10-CM | POA: Diagnosis present

## 2021-12-06 DIAGNOSIS — R531 Weakness: Secondary | ICD-10-CM | POA: Diagnosis not present

## 2021-12-06 DIAGNOSIS — C7951 Secondary malignant neoplasm of bone: Secondary | ICD-10-CM | POA: Diagnosis not present

## 2021-12-06 DIAGNOSIS — C7801 Secondary malignant neoplasm of right lung: Secondary | ICD-10-CM

## 2021-12-06 DIAGNOSIS — L0390 Cellulitis, unspecified: Secondary | ICD-10-CM

## 2021-12-06 DIAGNOSIS — R7881 Bacteremia: Secondary | ICD-10-CM | POA: Diagnosis not present

## 2021-12-06 DIAGNOSIS — C7800 Secondary malignant neoplasm of unspecified lung: Secondary | ICD-10-CM | POA: Diagnosis present

## 2021-12-06 DIAGNOSIS — N61 Mastitis without abscess: Secondary | ICD-10-CM | POA: Diagnosis present

## 2021-12-06 DIAGNOSIS — Z6837 Body mass index (BMI) 37.0-37.9, adult: Secondary | ICD-10-CM | POA: Diagnosis not present

## 2021-12-06 DIAGNOSIS — I10 Essential (primary) hypertension: Secondary | ICD-10-CM | POA: Diagnosis present

## 2021-12-06 DIAGNOSIS — E8809 Other disorders of plasma-protein metabolism, not elsewhere classified: Secondary | ICD-10-CM | POA: Diagnosis present

## 2021-12-06 DIAGNOSIS — R000 Tachycardia, unspecified: Secondary | ICD-10-CM | POA: Diagnosis not present

## 2021-12-06 LAB — CREATININE, SERUM
Creatinine, Ser: 0.84 mg/dL (ref 0.44–1.00)
GFR, Estimated: >60 mL/min (ref >60)

## 2021-12-06 LAB — CBC WITH DIFFERENTIAL/PLATELET
Abs Immature Granulocytes: 0.02 10*3/uL (ref 0.00–0.07)
Basophils Absolute: 0 10*3/uL (ref 0.0–0.1)
Basophils Relative: 1 %
Eosinophils Absolute: 0 10*3/uL (ref 0.0–0.5)
Eosinophils Relative: 0 %
HCT: 30.7 % — ABNORMAL LOW (ref 36.0–46.0)
Hemoglobin: 10.3 g/dL — ABNORMAL LOW (ref 12.0–15.0)
Immature Granulocytes: 1 %
Lymphocytes Relative: 6 %
Lymphs Abs: 0.2 10*3/uL — ABNORMAL LOW (ref 0.7–4.0)
MCH: 38.4 pg — ABNORMAL HIGH (ref 26.0–34.0)
MCHC: 33.6 g/dL (ref 30.0–36.0)
MCV: 114.6 fL — ABNORMAL HIGH (ref 80.0–100.0)
Monocytes Absolute: 0.2 10*3/uL (ref 0.1–1.0)
Monocytes Relative: 8 %
Neutro Abs: 2.3 10*3/uL (ref 1.7–7.7)
Neutrophils Relative %: 84 %
Platelets: 152 10*3/uL (ref 150–400)
RBC: 2.68 MIL/uL — ABNORMAL LOW (ref 3.87–5.11)
RDW: 14.7 % (ref 11.5–15.5)
WBC: 2.7 10*3/uL — ABNORMAL LOW (ref 4.0–10.5)
nRBC: 0 % (ref 0.0–0.2)

## 2021-12-06 LAB — COMPREHENSIVE METABOLIC PANEL
ALT: 13 U/L (ref 0–44)
AST: 20 U/L (ref 15–41)
Albumin: 3.3 g/dL — ABNORMAL LOW (ref 3.5–5.0)
Alkaline Phosphatase: 72 U/L (ref 38–126)
Anion gap: 7 (ref 5–15)
BUN: 16 mg/dL (ref 8–23)
CO2: 24 mmol/L (ref 22–32)
Calcium: 9 mg/dL (ref 8.9–10.3)
Chloride: 107 mmol/L (ref 98–111)
Creatinine, Ser: 0.99 mg/dL (ref 0.44–1.00)
GFR, Estimated: >60 mL/min (ref >60)
Glucose, Bld: 117 mg/dL — ABNORMAL HIGH (ref 70–99)
Potassium: 3.6 mmol/L (ref 3.5–5.1)
Sodium: 138 mmol/L (ref 135–145)
Total Bilirubin: 1.2 mg/dL (ref 0.3–1.2)
Total Protein: 6.6 g/dL (ref 6.5–8.1)

## 2021-12-06 LAB — URINALYSIS, ROUTINE W REFLEX MICROSCOPIC
Bilirubin Urine: NEGATIVE
Glucose, UA: NEGATIVE mg/dL
Hgb urine dipstick: NEGATIVE
Ketones, ur: 20 mg/dL — AB
Nitrite: NEGATIVE
Protein, ur: 100 mg/dL — AB
Specific Gravity, Urine: 1.029 (ref 1.005–1.030)
WBC, UA: >50 WBC/hpf — ABNORMAL HIGH (ref 0–5)
pH: 5 (ref 5.0–8.0)

## 2021-12-06 LAB — PROTIME-INR
INR: 1.1 (ref 0.8–1.2)
Prothrombin Time: 13.9 seconds (ref 11.4–15.2)

## 2021-12-06 LAB — CBC
HCT: 25 % — ABNORMAL LOW (ref 36.0–46.0)
Hemoglobin: 8 g/dL — ABNORMAL LOW (ref 12.0–15.0)
MCH: 37.7 pg — ABNORMAL HIGH (ref 26.0–34.0)
MCHC: 32 g/dL (ref 30.0–36.0)
MCV: 117.9 fL — ABNORMAL HIGH (ref 80.0–100.0)
Platelets: 134 10*3/uL — ABNORMAL LOW (ref 150–400)
RBC: 2.12 MIL/uL — ABNORMAL LOW (ref 3.87–5.11)
RDW: 14.9 % (ref 11.5–15.5)
WBC: 3.2 10*3/uL — ABNORMAL LOW (ref 4.0–10.5)
nRBC: 0 % (ref 0.0–0.2)

## 2021-12-06 LAB — LACTIC ACID, PLASMA
Lactic Acid, Venous: 1 mmol/L (ref 0.5–1.9)
Lactic Acid, Venous: 0.8 mmol/L (ref 0.5–1.9)

## 2021-12-06 LAB — APTT: aPTT: 34 seconds (ref 24–36)

## 2021-12-06 MED ORDER — SODIUM CHLORIDE 0.9 % IV SOLN
2.0000 g | Freq: Two times a day (BID) | INTRAVENOUS | Status: DC
  Administered 2021-12-06: 2 g via INTRAVENOUS
  Filled 2021-12-06: qty 12.5

## 2021-12-06 MED ORDER — FOLIC ACID 1 MG PO TABS
1.0000 mg | ORAL_TABLET | Freq: Every day | ORAL | Status: DC
  Administered 2021-12-06 – 2021-12-10 (×5): 1 mg via ORAL
  Filled 2021-12-06 (×5): qty 1

## 2021-12-06 MED ORDER — SODIUM CHLORIDE 0.9 % IV BOLUS
1000.0000 mL | Freq: Once | INTRAVENOUS | Status: AC
Start: 2021-12-06 — End: 2021-12-06
  Administered 2021-12-06: 1000 mL via INTRAVENOUS

## 2021-12-06 MED ORDER — FAMOTIDINE 20 MG PO TABS
20.0000 mg | ORAL_TABLET | Freq: Two times a day (BID) | ORAL | Status: DC | PRN
Start: 2021-12-06 — End: 2021-12-10
  Administered 2021-12-06: 20 mg via ORAL
  Filled 2021-12-06: qty 1

## 2021-12-06 MED ORDER — SODIUM CHLORIDE 0.9 % IV SOLN
INTRAVENOUS | Status: DC
  Administered 2021-12-06: 75 mL/h via INTRAVENOUS

## 2021-12-06 MED ORDER — OYSTER SHELL CALCIUM/D3 500-5 MG-MCG PO TABS
1.0000 | ORAL_TABLET | Freq: Two times a day (BID) | ORAL | Status: DC
  Administered 2021-12-06 – 2021-12-10 (×8): 1 via ORAL
  Filled 2021-12-06 (×9): qty 1

## 2021-12-06 MED ORDER — LORATADINE 10 MG PO TABS
10.0000 mg | ORAL_TABLET | Freq: Every day | ORAL | Status: DC
  Administered 2021-12-06 – 2021-12-10 (×5): 10 mg via ORAL
  Filled 2021-12-06 (×5): qty 1

## 2021-12-06 MED ORDER — ANASTROZOLE 1 MG PO TABS
1.0000 mg | ORAL_TABLET | Freq: Every day | ORAL | Status: DC
  Administered 2021-12-06 – 2021-12-10 (×5): 1 mg via ORAL
  Filled 2021-12-06 (×5): qty 1

## 2021-12-06 MED ORDER — MELATONIN 1 MG PO TABS
1.0000 mg | ORAL_TABLET | Freq: Every day | ORAL | Status: DC
  Filled 2021-12-06: qty 1

## 2021-12-06 MED ORDER — MELATONIN 3 MG PO TABS
1.5000 mg | ORAL_TABLET | Freq: Every day | ORAL | Status: DC
Start: 2021-12-06 — End: 2021-12-10
  Administered 2021-12-06 – 2021-12-09 (×4): 1.5 mg via ORAL
  Filled 2021-12-06 (×4): qty 1

## 2021-12-06 MED ORDER — ENOXAPARIN SODIUM 40 MG/0.4ML IJ SOSY
40.0000 mg | PREFILLED_SYRINGE | Freq: Every day | INTRAMUSCULAR | Status: DC
  Administered 2021-12-06 – 2021-12-07 (×2): 40 mg via SUBCUTANEOUS
  Filled 2021-12-06 (×2): qty 0.4

## 2021-12-06 MED ORDER — VANCOMYCIN HCL 750 MG/150ML IV SOLN
750.0000 mg | INTRAVENOUS | Status: DC
  Filled 2021-12-06: qty 150

## 2021-12-06 MED ORDER — VANCOMYCIN HCL IN DEXTROSE 1-5 GM/200ML-% IV SOLN
1000.0000 mg | Freq: Once | INTRAVENOUS | Status: AC
Start: 2021-12-06 — End: 2021-12-06
  Administered 2021-12-06: 1000 mg via INTRAVENOUS
  Filled 2021-12-06: qty 200

## 2021-12-06 MED ORDER — SERTRALINE HCL 50 MG PO TABS
75.0000 mg | ORAL_TABLET | Freq: Every day | ORAL | Status: DC
  Administered 2021-12-06 – 2021-12-09 (×4): 75 mg via ORAL
  Filled 2021-12-06 (×2): qty 1
  Filled 2021-12-06: qty 2
  Filled 2021-12-06 (×2): qty 1

## 2021-12-06 NOTE — ED Triage Notes (Signed)
Patient BIB EMS from home. Patient c/o fatigue and chills for 2 days. Patient states she began to shiver two days ago. Patient was recently treated for cellulitis  with antibiotics. Patient has hx of breast cancer. EMS placed 20g in left AC and gave 468m of NS. Patient is on 1 1/2L of O2.

## 2021-12-06 NOTE — ED Notes (Signed)
Patient ambulatory with 1 assist to bathroom.

## 2021-12-06 NOTE — H&P (Signed)
History and Physical    Suzanne [name] MKL:[id number] DOB: 03-21-1961 DOA: 12/06/2021  PCP: [name], NP Patient coming from: home  Chief Complaint: Chills rigors and fatigue  HPI: Suzanne [name] is a 61 y.o. 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 She is admitted with chills rigors and erythema redness and warmth to her right breast.  She also complains of a cough and is on oxygen at home.  She denies shortness of breath nausea vomiting diarrhea abdominal pain or urinary complaints.  Her appetite has been poor for the last few days with increasing generalized weakness.(On Lasix at home) She noticed her right breast was red today She was recently treated for cellulitis with antibiotics. Cxr negative stable diffuse osseous sclerotic metastatic dz Na 138 k 3.6 bun 16 cr 0.99  Wbc 2.7 LA 1.0 HB 10.3 platelets 152 She was tachycardic and tachypneic with leukopenia on admission blood pressure  soft Procalcitonin elevated   ED Course: got vancomycin  Review of Systems: see above  Ambulatory Status:walks at home Lives with family  Past Medical History:  Diagnosis Date   Cancer [facility])    Family history of breast cancer    Family history of colon cancer    Family history of melanoma    Family history of prostate cancer    Gallstones    Glaucoma    Mastitis    reports history of recurrent mastitis   Metastatic breast cancer    Obesity     Past Surgical History:  Procedure Laterality Date   GLAUCOMA SURGERY  late 1990's   IR THORACENTESIS ASP PLEURAL SPACE W/IMG GUIDE  10/28/2018   IR THORACENTESIS ASP PLEURAL SPACE W/IMG GUIDE  10/29/2018   OPEN REDUCTION INTERNAL FIXATION (ORIF) DISTAL RADIAL FRACTURE Left 01/09/2019   Procedure: OPEN REDUCTION INTERNAL FIXATION (ORIF) HUMERAL FRACTURE;  Surgeon: Netta Cedars, MD;  Location: [facility] ORS;  Service: Orthopedics;  Laterality:  Left;    Social History   Socioeconomic History   Marital status: Widowed    Spouse name: Not on file   Number of children: 1   Years of education: Not on file   Highest education level: Not on file  Occupational History   Occupation: Clinical research associate Programs    Comment: Self-employed  Tobacco Use   Smoking status: Never   Smokeless tobacco: Never   Tobacco comments:    second hand smoke exposure  Vaping Use   Vaping Use: Never used  Substance and Sexual Activity   Alcohol use: No   Drug use: No   Sexual activity: Not Currently  Other Topics Concern   Not on file  Social History Narrative   Has a daughter named [name]. Daughter has CP.   Social Determinants of Health   Financial Resource Strain: Low Risk  (08/16/2021)   Overall Financial Resource Strain (CARDIA)    Difficulty of Paying Living Expenses: Not hard at all  Food Insecurity: Food Insecurity Present (11/08/2021)   Hunger Vital Sign    Worried About Running Out of Food in the Last Year: Sometimes true    Ran Out of Food in the Last Year: Sometimes true  Transportation Needs: No Transportation Needs (09/15/2021)   PRAPARE - Hydrologist (Medical): No    Lack of Transportation (Non-Medical): No  Physical Activity: Not on file  Stress: Stress Concern Present (11/08/2021)   [employer]  Occupational Health - Occupational Stress Questionnaire    Feeling of Stress : To some extent  Social Connections: Socially Isolated (03/27/2021)   Social Connection and Isolation Panel [NHANES]    Frequency of Communication with Friends and Family: Once a week    Frequency of Social Gatherings with Friends and Family: More than three times a week    Attends Religious Services: Never    Marine scientist or Organizations: No    Attends Archivist Meetings: Never    Marital Status: Widowed  Intimate Partner Violence: Not At Risk (12/24/2018)   Humiliation, Afraid, Rape, and  Kick questionnaire    Fear of Current or Ex-Partner: No    Emotionally Abused: No    Physically Abused: No    Sexually Abused: No    Allergies  Allergen Reactions   Ferumoxytol Other (See Comments)    Pt had hypersensitivity to Ferumoxytol. She had Facial Flushing. See note from 07/14/2021    Family History  Problem Relation Age of Onset   Breast cancer Sister        in her 62's-70s   Heart disease Brother        CABG   Heart disease Brother        CABG   Skin cancer Brother        melanoma   Colon cancer Cousin    Heart attack Mother    Heart attack Father    Prostate cancer Brother       Prior to Admission medications   Medication Sig Start Date End Date Taking? Authorizing Provider  acetaminophen (TYLENOL) 500 MG tablet Take 1,000 mg by mouth every 6 (six) hours as needed for mild pain.   Yes [provider]  anastrozole (ARIMIDEX) 1 MG tablet Take 1 tablet (1 mg total) by mouth daily. 09/29/21  Yes Benay Pike, MD  cetirizine (ALLERGY RELIEF CETIRIZINE) 10 MG tablet Take 1 tablet (10 mg total) by mouth daily. 08/09/21 12/06/21 Yes [name], NP  famotidine (PEPCID) 20 MG tablet Take 1 tablet (20 mg total) by mouth 2 (two) times daily as needed for heartburn or indigestion. 07/28/20  Yes [name], Virgie Dad, MD  folic acid (FOLVITE) 1 MG tablet Take 1 tablet (1 mg total) by mouth daily. 09/29/21  Yes Benay Pike, MD  furosemide (LASIX) 20 MG tablet Take 1 tablet (20 mg total) by mouth daily. 09/29/21  Yes Benay Pike, MD  Ibuprofen-Acetaminophen (ADVIL DUAL ACTION) 125-250 MG TABS Take 2 tablets by mouth daily as needed (pain).   Yes [provider]  MELATONIN TR 1 MG TBCR Take 1 mg by mouth at bedtime. 07/06/21  Yes [provider]  nystatin (MYCOSTATIN/NYSTOP) powder Apply 1 application topically as needed. Patient taking differently: Apply 1 application  topically daily as needed (skin irritation). 07/22/20  Yes Causey, Charlestine Massed, NP   OYSTER SHELL CALCIUM PLUS D 500-5 MG-MCG TABS TAKE ONE TABLET BY MOUTH EVERY MORNING and TAKE ONE TABLET BY MOUTH AT NOON and TAKE ONE TABLET BY MOUTH EVERY EVENING Patient taking differently: Take 1 tablet by mouth 2 (two) times daily. 04/27/21  Yes [name], Virgie Dad, MD  palbociclib (IBRANCE) 75 MG tablet TAKE 1 TABLET (75 MG TOTAL) BY MOUTH DAILY. TAKE FOR 21 DAYS ON, 7 DAYS OFF, REPEAT EVERY 28 DAYS 08/08/21 08/08/22 Yes Iruku, Arletha Pili, MD  polyethylene glycol (MIRALAX / GLYCOLAX) 17 g packet Take 17 g by mouth as needed. Patient taking differently: Take 17 g by mouth  daily as needed for mild constipation. 07/28/20  Yes [name], Virgie Dad, MD  sertraline (ZOLOFT) 50 MG tablet TAKE 1 AND 1/2 TABLETS BY MOUTH EVERYDAY AT BEDTIME Patient taking differently: Take 75 mg by mouth daily. 09/28/21  Yes [name], NP  Blood Pressure Monitor MISC Medium sized cuff. Use to check blood pressure twice per day and more often if not feeling well. I10.0 07/19/20   [name], NP    Physical Exam: Vitals:   12/06/21 1530 12/06/21 1545 12/06/21 1600 12/06/21 1702  BP: (!) 122/56 123/71 123/75 137/72  Pulse: (!) 101 (!) 107 (!) 105 (!) 102  Resp: (!) 22 (!) 24 (!) 22 20  SpO2: 96% 97% 97% 98%  Weight:      Height:         General:  Appears in mild distress  eyes:  PERRL, EOMI, normal lids, iris ENT:  grossly normal hearing, lips & tongue, mmm Neck:  no LAD, masses or thyromegaly Cardiovascular:  RRR, no m/r/g. No LE edema.  Respiratory: Few rhonchi bilaterally, no w/r/r. Normal respiratory effort. Breast right breast erythematous tender and warm to touch Abdomen:  soft, ntnd, NABS Skin:  no rash or induration seen on limited exam Musculoskeletal:  grossly normal tone BUE/BLE, good ROM, no bony abnormality Psychiatric:  grossly normal mood and affect, speech fluent and appropriate, AOx3 Neurologic:  CN 2-12 grossly intact, moves all extremities in coordinated fashion, sensation  intact  Labs on Admission: I have personally reviewed following labs and imaging studies  CBC: Recent Labs  Lab 12/06/21 1420  WBC 2.7*  NEUTROABS PENDING  HGB 10.3*  HCT 30.7*  MCV 114.6*  PLT 427   Basic Metabolic Panel: Recent Labs  Lab 12/06/21 1420  NA 138  K 3.6  CL 107  CO2 24  GLUCOSE 117*  BUN 16  CREATININE 0.99  CALCIUM 9.0   GFR: Estimated Creatinine Clearance: 55.3 mL/min (by C-G formula based on SCr of 0.99 mg/dL). Liver Function Tests: Recent Labs  Lab 12/06/21 1420  AST 20  ALT 13  ALKPHOS 72  BILITOT 1.2  PROT 6.6  ALBUMIN 3.3*   No results for input(s): "LIPASE", "AMYLASE" in the last 168 hours. No results for input(s): "AMMONIA" in the last 168 hours. Coagulation Profile: Recent Labs  Lab 12/06/21 1420  INR 1.1   Cardiac Enzymes: No results for input(s): "CKTOTAL", "CKMB", "CKMBINDEX", "TROPONINI" in the last 168 hours. BNP (last 3 results) No results for input(s): "PROBNP" in the last 8760 hours. HbA1C: No results for input(s): "HGBA1C" in the last 72 hours. CBG: No results for input(s): "GLUCAP" in the last 168 hours. Lipid Profile: No results for input(s): "CHOL", "HDL", "LDLCALC", "TRIG", "CHOLHDL", "LDLDIRECT" in the last 72 hours. Thyroid Function Tests: No results for input(s): "TSH", "T4TOTAL", "FREET4", "T3FREE", "THYROIDAB" in the last 72 hours. Anemia Panel: No results for input(s): "VITAMINB12", "FOLATE", "FERRITIN", "TIBC", "IRON", "RETICCTPCT" in the last 72 hours. Urine analysis:    Component Value Date/Time   COLORURINE YELLOW 04/07/2020 1216   APPEARANCEUR CLEAR 04/07/2020 1216   LABSPEC 1.026 04/07/2020 1216   PHURINE 5.0 04/07/2020 1216   GLUCOSEU NEGATIVE 04/07/2020 1216   HGBUR NEGATIVE 04/07/2020 1216   BILIRUBINUR NEGATIVE 04/07/2020 1216   KETONESUR NEGATIVE 04/07/2020 1216   PROTEINUR NEGATIVE 04/07/2020 1216   NITRITE NEGATIVE 04/07/2020 1216   LEUKOCYTESUR SMALL (A) 04/07/2020 1216     Creatinine Clearance: Estimated Creatinine Clearance: 55.3 mL/min (by C-G formula based on SCr of 0.99  mg/dL).  Sepsis Labs: '@LABRCNTIP' (procalcitonin:4,lacticidven:4) )No results found for this or any previous visit (from the past 240 hour(s)).   Radiological Exams on Admission: DG Chest Port 1 View  Result Date: 12/06/2021 CLINICAL DATA:  Sepsis. EXAM: PORTABLE CHEST 1 VIEW COMPARISON:  CT chest abdomen and pelvis 08/08/2021 FINDINGS: There is stable moderate elevation of the right hemidiaphragm. The heart size and mediastinal contours are within normal limits. Both lungs are clear. Expansile sclerotic lesion in the medial right clavicle again noted. Additional sclerotic osseous lesions are seen throughout the visualized skeletal structures. There are multiple healed left-sided rib fractures. IMPRESSION: 1. No acute cardiopulmonary process. 2. Stable diffuse osseous sclerotic metastatic disease. Electronically Signed   By: Ronney Asters M.D.   On: 12/06/2021 15:16     Assessment/Plan Active Problems:   * No active hospital problems. *   #1 sepsis secondary to right breast cellulitis.  She met criteria for sepsis on admission with tachycardia tachypnea and leukopenia of Started vancomycin and cefepime. She failed outpatient treatment for cellulitis with antibiotics. Followed by Dr. Chryl Heck Follow blood cultures. Her blood pressure has been soft hold Lasix Patient has history of bilateral pleural effusion but currently her chest x-ray is clear IV fluids  #2 ER positive stage IV breast CA on oral chemotherapy  #3 macrocytic anemia stable follow on folic acid  #4 DVT prophylaxis Lovenox  #5 hypoalbuminemia encourage p.o. intake   RN Pressure Injury Documentation: Pressure Injury 12/25/18 Sacrum Mid Deep Tissue Injury - Purple or maroon localized area of discolored intact skin or blood-filled blister due to damage of underlying soft tissue from pressure and/or shear. evolving into  stage 2 in patchy areas when asse (Active)  12/25/18 1700  Location: Sacrum  Location Orientation: Mid  Staging: Deep Tissue Injury - Purple or maroon localized area of discolored intact skin or blood-filled blister due to damage of underlying soft tissue from pressure and/or shear.  Wound Description (Comments): evolving into stage 2 in patchy areas when assessed on 8/2  Present on Admission: Yes     Estimated body mass index is 37.4 kg/m as calculated from the following:   Height as of this encounter: '4\' 11"'  (1.499 m).   Weight as of this encounter: 84 kg.   DVT prophylaxis: Lovenox Code Status: Full code Family Communication: None at bedside Disposition Plan: Pending clinical improvement Consults called: None Admission status: Inpatient   Georgette Shell MD [facility] 12/06/2021, 5:10 PM

## 2021-12-06 NOTE — Plan of Care (Signed)
  Problem: Education: Goal: Knowledge of General Education information will improve Description: Including pain rating scale, medication(s)/side effects and non-pharmacologic comfort measures Outcome: Progressing   Problem: Health Behavior/Discharge Planning: Goal: Ability to manage health-related needs will improve Outcome: Progressing   Problem: Nutrition: Goal: Adequate nutrition will be maintained Outcome: Progressing   

## 2021-12-06 NOTE — ED Provider Notes (Signed)
[facility] DEPT Provider Note   CSN: [id number] Arrival date & time: 12/06/21  1413     History  No chief complaint on file.   Suzanne [name] is a 61 y.o. female.  HPI Patient with history of breast cancer, currently receiving immunomodulatory agents presents with chills, subjective fever.  Patient recently completed treatment for cellulitis with antibiotics, and on arrival notes that she has a new red lesion on her right breast.  Per EMS the patient requires oxygen 1.5 L 24/7.  No reported hemodynamic instability per EMS in route.  Additional details per patient's oncology clinic pertinent notes below:  ASSESSMENT: 61 y.o. [place], [place] woman presenting 10/26/2018 with right-sided inflammatory breast cancer, stage IV, involving lungs, lymph nodes and bones, as follows:             (a) chest CT scan 10/26/2018 shows bilateral pleural effusions, possible lymphangitic spread of tumor, diffuse bony metastatic disease, and significant axillary mediastinal and hilar adenopathy             (b) bone scan 10/27/2018 is a "near [place]" consistent with widespread bony metastatic disease             (c) head CT with and without contrast 10/30/2018 shows no intracranial metastatic disease, multiple calvarial lesions             (d) CA-27-29 on 10/27/2018 was 1810.4   (1) pleural fluid from right thoracentesis 10/28/2018 confirms malignant cells consistent with a breast primary, strongly estrogen and progesterone receptor positive, HER-2 not amplified, with an MIB-1 of 2%   (2) anastrozole started 10/29/2018             (a) palbociclib started 11/13/2018 at 100 mg dose             (b) denosumab/xgeva started 11/13/2018              (C) palbociclib dose started   (3) associated problems:             (a) hypoxia secondary to effusions             (b) pain from bone lesions and left humeral and vertebral compression fractures             (c) right  upper extremity lymphedema             (d) poor venous access     Home Medications Prior to Admission medications   Medication Sig Start Date End Date Taking? Authorizing Provider  acetaminophen (TYLENOL) 500 MG tablet Take 1,000 mg by mouth every 6 (six) hours as needed for mild pain.   Yes [provider]  anastrozole (ARIMIDEX) 1 MG tablet Take 1 tablet (1 mg total) by mouth daily. 09/29/21  Yes Benay Pike, MD  cetirizine (ALLERGY RELIEF CETIRIZINE) 10 MG tablet Take 1 tablet (10 mg total) by mouth daily. 08/09/21 12/06/21 Yes [name], NP  famotidine (PEPCID) 20 MG tablet Take 1 tablet (20 mg total) by mouth 2 (two) times daily as needed for heartburn or indigestion. 07/28/20  Yes [name], Virgie Dad, MD  folic acid (FOLVITE) 1 MG tablet Take 1 tablet (1 mg total) by mouth daily. 09/29/21  Yes Benay Pike, MD  furosemide (LASIX) 20 MG tablet Take 1 tablet (20 mg total) by mouth daily. 09/29/21  Yes Benay Pike, MD  Ibuprofen-Acetaminophen (ADVIL DUAL ACTION) 125-250 MG TABS Take 2 tablets by mouth daily as needed (pain).   Yes [provider]  MELATONIN TR 1 MG TBCR Take 1 mg by mouth at bedtime. 07/06/21  Yes [provider]  nystatin (MYCOSTATIN/NYSTOP) powder Apply 1 application topically as needed. Patient taking differently: Apply 1 application  topically daily as needed (skin irritation). 07/22/20  Yes Causey, Charlestine Massed, NP  OYSTER SHELL CALCIUM PLUS D 500-5 MG-MCG TABS TAKE ONE TABLET BY MOUTH EVERY MORNING and TAKE ONE TABLET BY MOUTH AT NOON and TAKE ONE TABLET BY MOUTH EVERY EVENING Patient taking differently: Take 1 tablet by mouth 2 (two) times daily. 04/27/21  Yes [name], Virgie Dad, MD  palbociclib (IBRANCE) 75 MG tablet TAKE 1 TABLET (75 MG TOTAL) BY MOUTH DAILY. TAKE FOR 21 DAYS ON, 7 DAYS OFF, REPEAT EVERY 28 DAYS 08/08/21 08/08/22 Yes Iruku, Arletha Pili, MD  polyethylene glycol (MIRALAX / GLYCOLAX) 17 g packet Take 17 g by mouth as  needed. Patient taking differently: Take 17 g by mouth daily as needed for mild constipation. 07/28/20  Yes [name], Virgie Dad, MD  sertraline (ZOLOFT) 50 MG tablet TAKE 1 AND 1/2 TABLETS BY MOUTH EVERYDAY AT BEDTIME Patient taking differently: Take 75 mg by mouth daily. 09/28/21  Yes [name], NP  Blood Pressure Monitor MISC Medium sized cuff. Use to check blood pressure twice per day and more often if not feeling well. I10.0 07/19/20   [name], NP      Allergies    Ferumoxytol    Review of Systems   Review of Systems  All other systems reviewed and are negative.   Physical Exam Updated Vital Signs BP 123/75   Pulse (!) 105   Resp (!) 22   Ht $R'4\' 11"'xf$  (1.499 m)   Wt 84 kg   LMP 05/28/2013 (Within Months)   SpO2 97%   BMI 37.40 kg/m  Physical Exam Vitals and nursing note reviewed.  Constitutional:      Appearance: She is well-developed. She is obese. She is ill-appearing.  HENT:     Head: Normocephalic and atraumatic.  Eyes:     Conjunctiva/sclera: Conjunctivae normal.  Cardiovascular:     Rate and Rhythm: Regular rhythm. Tachycardia present.  Pulmonary:     Effort: Pulmonary effort is normal. Tachypnea present. No respiratory distress.     Breath sounds: Normal breath sounds. No stridor.  Abdominal:     General: There is no distension.  Skin:    General: Skin is warm and dry.       Neurological:     Mental Status: She is alert and oriented to person, place, and time.     Cranial Nerves: No cranial nerve deficit.  Psychiatric:        Mood and Affect: Mood normal.     ED Results / Procedures / Treatments   Labs (all labs ordered are listed, but only abnormal results are displayed) Labs Reviewed  COMPREHENSIVE METABOLIC PANEL - Abnormal; Notable for the following components:      Result Value   Glucose, Bld 117 (*)    Albumin 3.3 (*)    All other components within normal limits  CBC WITH DIFFERENTIAL/PLATELET - Abnormal; Notable for the following  components:   WBC 2.7 (*)    RBC 2.68 (*)    Hemoglobin 10.3 (*)    HCT 30.7 (*)    MCV 114.6 (*)    MCH 38.4 (*)    All other components within normal limits  CULTURE, BLOOD (ROUTINE X 2)  CULTURE, BLOOD (ROUTINE X 2)  URINE CULTURE  PROTIME-INR  APTT  LACTIC ACID, PLASMA  LACTIC ACID, PLASMA  URINALYSIS, ROUTINE W REFLEX MICROSCOPIC    EKG EKG Interpretation  Date/Time:  Wednesday December 06 2021 14:35:04 EDT Ventricular Rate:  112 PR Interval:  166 QRS Duration: 82 QT Interval:  302 QTC Calculation: 413 R Axis:   54 Text Interpretation: Sinus tachycardia Low voltage, precordial leads Nonspecific T abnormalities, diffuse leads Baseline wander in lead(s) III aVF V4 V5 V6 Artifact Abnormal ECG Confirmed by Carmin Muskrat [phone]) on 12/06/2021 3:57:46 PM  Radiology DG Chest Port 1 View  Result Date: 12/06/2021 CLINICAL DATA:  Sepsis. EXAM: PORTABLE CHEST 1 VIEW COMPARISON:  CT chest abdomen and pelvis 08/08/2021 FINDINGS: There is stable moderate elevation of the right hemidiaphragm. The heart size and mediastinal contours are within normal limits. Both lungs are clear. Expansile sclerotic lesion in the medial right clavicle again noted. Additional sclerotic osseous lesions are seen throughout the visualized skeletal structures. There are multiple healed left-sided rib fractures. IMPRESSION: 1. No acute cardiopulmonary process. 2. Stable diffuse osseous sclerotic metastatic disease. Electronically Signed   By: Ronney Asters M.D.   On: 12/06/2021 15:16    Procedures Procedures    Medications Ordered in ED Medications  vancomycin (VANCOCIN) IVPB 1000 mg/200 mL premix (1,000 mg Intravenous New Bag/Given 12/06/21 1614)  sodium chloride 0.9 % bolus 1,000 mL (1,000 mLs Intravenous New Bag/Given 12/06/21 1614)    ED Course/ Medical Decision Making/ A&P This patient with a Hx of breast cancer, obesity, multiple other medical problems, chronic respiratory insufficiency as well presents  to the ED for concern of chills tachycardia tachypnea new skin lesion, this involves an extensive number of treatment options, and is a complaint that carries with it a high risk of complications and morbidity.    The differential diagnosis includes SIRS, sepsis, cellulitis, dehydration   Social Determinants of Health:  Obesity ongoing cancer therapy  Additional history obtained:  Additional history and/or information obtained from oncology notes included above   After the initial evaluation, orders, including: Labs fluids vancomycin empirically for skin lesion were initiated.   Patient placed on Cardiac and Pulse-Oximetry Monitors. The patient was maintained on a cardiac monitor.  The cardiac monitored showed an rhythm of 110 sinus tach abnormal The patient was also maintained on pulse oximetry. The readings were typically 99% 1.5 L nasal cannula   On repeat evaluation of the patient improved   Patient has begun receive fluid resuscitation, will receive total of 2 L, which is not quite 30 mL/kg, but is sufficient for adjusted body weight, and the patient has no current evidence for hypotension.  Lab Tests:  I personally interpreted labs.  The pertinent results include: Leukopenia  Imaging Studies ordered:  I independently visualized and interpreted imaging which showed no obvious pneumonia on x-ray I agree with the radiologist interpretation   Dispostion / Final MDM:  After consideration of the diagnostic results and the patient's response to treatment, this adult female, obese, with ongoing therapy for breast cancer, relatively immunocompromised presents with chills, is found to be tachycardic, tachypneic, and with skin changes meets SIRS/sepsis criteria.  No early evidence for septic shock, but given her immunocompromise state, patient required initiation of vancomycin, fluids, admission for monitoring, management.  Final Clinical Impression(s) / ED Diagnoses Final  diagnoses:  Sepsis without acute organ dysfunction, due to unspecified organism [facility])    CRITICAL CARE Performed by: Carmin Muskrat Total critical care time: 35 minutes Critical care time was exclusive of separately billable procedures  and treating other patients. Critical care was necessary to treat or prevent imminent or life-threatening deterioration. Critical care was time spent personally by me on the following activities: development of treatment plan with patient and/or surrogate as well as nursing, discussions with consultants, evaluation of patient's response to treatment, examination of patient, obtaining history from patient or surrogate, ordering and performing treatments and interventions, ordering and review of laboratory studies, ordering and review of radiographic studies, pulse oximetry and re-evaluation of patient's condition.    Carmin Muskrat, MD 12/06/21 [phone]

## 2021-12-06 NOTE — Telephone Encounter (Signed)
This RN spoke with Suzanne [name] post her VM stating concerns with onset of symptoms yesterday.  Suzanne [name] states her mother had chills intermittently ( " had to cut down the A/C to warm the house up and she had on a blanket") No cough or fever. No other complaints.  During the night pt states she " had hot flashes " and then again this AM she is having chills " worse then yesterday"  Again pt is afebrile.  Pt is more sleepy.  Per discussion this RN asked if they could come in today for evaluation due to possible infection even though patient does not have a fever.  Suzanne [name] states issue relating to arranging transportation- she will see who is available to and call this RN back.

## 2021-12-06 NOTE — ED Notes (Signed)
Patient states she can't produce a urine sample yet   she stated she would notify when she could

## 2021-12-07 ENCOUNTER — Inpatient Hospital Stay (HOSPITAL_COMMUNITY): Payer: Medicaid Other

## 2021-12-07 DIAGNOSIS — L0390 Cellulitis, unspecified: Secondary | ICD-10-CM | POA: Diagnosis not present

## 2021-12-07 DIAGNOSIS — A419 Sepsis, unspecified organism: Secondary | ICD-10-CM | POA: Diagnosis not present

## 2021-12-07 DIAGNOSIS — C7951 Secondary malignant neoplasm of bone: Secondary | ICD-10-CM | POA: Diagnosis not present

## 2021-12-07 DIAGNOSIS — K449 Diaphragmatic hernia without obstruction or gangrene: Secondary | ICD-10-CM | POA: Diagnosis not present

## 2021-12-07 DIAGNOSIS — K8020 Calculus of gallbladder without cholecystitis without obstruction: Secondary | ICD-10-CM | POA: Diagnosis not present

## 2021-12-07 DIAGNOSIS — J90 Pleural effusion, not elsewhere classified: Secondary | ICD-10-CM | POA: Diagnosis not present

## 2021-12-07 DIAGNOSIS — K429 Umbilical hernia without obstruction or gangrene: Secondary | ICD-10-CM | POA: Diagnosis not present

## 2021-12-07 DIAGNOSIS — J9811 Atelectasis: Secondary | ICD-10-CM | POA: Diagnosis not present

## 2021-12-07 LAB — COMPREHENSIVE METABOLIC PANEL
ALT: 13 U/L (ref 0–44)
AST: 18 U/L (ref 15–41)
Albumin: 2.8 g/dL — ABNORMAL LOW (ref 3.5–5.0)
Alkaline Phosphatase: 62 U/L (ref 38–126)
Anion gap: 7 (ref 5–15)
BUN: 15 mg/dL (ref 8–23)
CO2: 22 mmol/L (ref 22–32)
Calcium: 8.4 mg/dL — ABNORMAL LOW (ref 8.9–10.3)
Chloride: 113 mmol/L — ABNORMAL HIGH (ref 98–111)
Creatinine, Ser: 0.86 mg/dL (ref 0.44–1.00)
GFR, Estimated: >60 mL/min (ref >60)
Glucose, Bld: 115 mg/dL — ABNORMAL HIGH (ref 70–99)
Potassium: 3.8 mmol/L (ref 3.5–5.1)
Sodium: 142 mmol/L (ref 135–145)
Total Bilirubin: 0.7 mg/dL (ref 0.3–1.2)
Total Protein: 5.5 g/dL — ABNORMAL LOW (ref 6.5–8.1)

## 2021-12-07 LAB — BLOOD CULTURE ID PANEL (REFLEXED) - BCID2

## 2021-12-07 LAB — CBC
HCT: 26.3 % — ABNORMAL LOW (ref 36.0–46.0)
Hemoglobin: 8.5 g/dL — ABNORMAL LOW (ref 12.0–15.0)
MCH: 37.8 pg — ABNORMAL HIGH (ref 26.0–34.0)
MCHC: 32.3 g/dL (ref 30.0–36.0)
MCV: 116.9 fL — ABNORMAL HIGH (ref 80.0–100.0)
Platelets: 127 10*3/uL — ABNORMAL LOW (ref 150–400)
RBC: 2.25 MIL/uL — ABNORMAL LOW (ref 3.87–5.11)
RDW: 14.6 % (ref 11.5–15.5)
WBC: 2.3 10*3/uL — ABNORMAL LOW (ref 4.0–10.5)
nRBC: 0 % (ref 0.0–0.2)

## 2021-12-07 LAB — HIV ANTIBODY (ROUTINE TESTING W REFLEX): HIV Screen 4th Generation wRfx: NONREACTIVE

## 2021-12-07 MED ORDER — KETOCONAZOLE 2 % EX CREA
TOPICAL_CREAM | Freq: Every day | CUTANEOUS | Status: DC
  Administered 2021-12-08 – 2021-12-10 (×3): 1 via TOPICAL
  Filled 2021-12-07: qty 15

## 2021-12-07 MED ORDER — IOHEXOL 300 MG/ML  SOLN
100.0000 mL | Freq: Once | INTRAMUSCULAR | Status: AC | PRN
  Administered 2021-12-07: 100 mL via INTRAVENOUS

## 2021-12-07 MED ORDER — ACETAMINOPHEN 325 MG PO TABS
650.0000 mg | ORAL_TABLET | Freq: Four times a day (QID) | ORAL | Status: DC | PRN
  Administered 2021-12-07: 650 mg via ORAL
  Filled 2021-12-07: qty 2

## 2021-12-07 MED ORDER — PENICILLIN G POTASSIUM 20000000 UNITS IJ SOLR
12.0000 10*6.[IU] | Freq: Two times a day (BID) | INTRAVENOUS | Status: DC
  Administered 2021-12-07 – 2021-12-10 (×7): 12 10*6.[IU] via INTRAVENOUS
  Filled 2021-12-07 (×8): qty 12

## 2021-12-07 MED ORDER — NYSTATIN 100000 UNIT/GM EX POWD
Freq: Three times a day (TID) | CUTANEOUS | Status: DC
  Administered 2021-12-08: 1 via TOPICAL
  Filled 2021-12-07: qty 15

## 2021-12-07 MED ORDER — IOHEXOL 9 MG/ML PO SOLN
ORAL | Status: AC
  Administered 2021-12-07: 500 mL
  Filled 2021-12-07: qty 1000

## 2021-12-07 NOTE — Progress Notes (Signed)
Transition of Care [facility]) Screening Note  Patient Details  Name: Suzanne [name] Date of Birth: 07/31/1960  Transition of Care [facility]) CM/SW Contact:    Sherie Don, LCSW Phone Number: 12/07/2021, 9:20 AM  Transition of Care Department [facility]) has reviewed patient and no TOC needs have been identified at this time. We will continue to monitor patient advancement through interdisciplinary progression rounds. If new patient transition needs arise, please place a TOC consult.

## 2021-12-07 NOTE — Progress Notes (Signed)
PHARMACY - PHYSICIAN COMMUNICATION CRITICAL VALUE ALERT - BLOOD CULTURE IDENTIFICATION (BCID)  Suzanne [name] is an 61 y.o. female who presented to [facility] on 12/06/2021 with chills rigors and erythema redness and warmth to her right breast  Assessment:  Group b strep in 1/4, no Resistance  Name of physician (or Provider) Contacted: Clarene Essex  Current antibiotics: vancomycin, cefepime  Changes to prescribed antibiotics recommended:  None for now  Results for orders placed or performed during the hospital encounter of 12/06/21  Blood Culture ID Panel (Reflexed) (Collected: 12/06/2021  2:35 PM)  Result Value Ref Range   Enterococcus faecalis NOT DETECTED NOT DETECTED   Enterococcus Faecium NOT DETECTED NOT DETECTED   Listeria monocytogenes NOT DETECTED NOT DETECTED   Staphylococcus species NOT DETECTED NOT DETECTED   Staphylococcus aureus (BCID) NOT DETECTED NOT DETECTED   Staphylococcus epidermidis NOT DETECTED NOT DETECTED   Staphylococcus lugdunensis NOT DETECTED NOT DETECTED   Streptococcus species DETECTED (A) NOT DETECTED   Streptococcus agalactiae DETECTED (A) NOT DETECTED   Streptococcus pneumoniae NOT DETECTED NOT DETECTED   Streptococcus pyogenes NOT DETECTED NOT DETECTED   A.calcoaceticus-baumannii NOT DETECTED NOT DETECTED   Bacteroides fragilis NOT DETECTED NOT DETECTED   Enterobacterales NOT DETECTED NOT DETECTED   Enterobacter cloacae complex NOT DETECTED NOT DETECTED   Escherichia coli NOT DETECTED NOT DETECTED   Klebsiella aerogenes NOT DETECTED NOT DETECTED   Klebsiella oxytoca NOT DETECTED NOT DETECTED   Klebsiella pneumoniae NOT DETECTED NOT DETECTED   Proteus species NOT DETECTED NOT DETECTED   Salmonella species NOT DETECTED NOT DETECTED   Serratia marcescens NOT DETECTED NOT DETECTED   Haemophilus influenzae NOT DETECTED NOT DETECTED   Neisseria meningitidis NOT DETECTED NOT DETECTED   Pseudomonas aeruginosa NOT DETECTED NOT DETECTED    Stenotrophomonas maltophilia NOT DETECTED NOT DETECTED   Candida albicans NOT DETECTED NOT DETECTED   Candida auris NOT DETECTED NOT DETECTED   Candida glabrata NOT DETECTED NOT DETECTED   Candida krusei NOT DETECTED NOT DETECTED   Candida parapsilosis NOT DETECTED NOT DETECTED   Candida tropicalis NOT DETECTED NOT DETECTED   Cryptococcus neoformans/gattii NOT DETECTED NOT DETECTED    Dolly Rias RPh 12/07/2021, 5:10 AM

## 2021-12-07 NOTE — Progress Notes (Signed)
PROGRESS NOTE    Suzanne [name]  JQG:[id number] DOB: 26-Nov-1960 DOA: 12/06/2021 PCP: [name], NP  Brief Narrative:  Suzanne [name] is a 61 y.o. 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 She is admitted with chills rigors and erythema redness and warmth to her right breast.  She also complains of a cough and is on oxygen at home.  She denies shortness of breath nausea vomiting diarrhea abdominal pain or urinary complaints.  Her appetite has been poor for the last few days with increasing generalized weakness.(On Lasix at home) She noticed her right breast was red today She was recently treated for cellulitis with antibiotics. Cxr negative stable diffuse osseous sclerotic metastatic dz Na 138 k 3.6 bun 16 cr 0.99  Wbc 2.7 LA 1.0 HB 10.3 platelets 152 She was tachycardic and tachypneic with leukopenia on admission blood pressure  soft Procalcitonin elevated  Assessment & Plan:   Principal Problem:   Sepsis due to cellulitis [facility]) Active Problems:   Breast skin changes   Macrocytic anemia   Malignant neoplasm metastatic to bone [facility])   Malignant neoplasm of overlapping sites of right breast in female, estrogen receptor positive ([place])   Hypoalbuminemia   Essential hypertension  #1 sepsis secondary to right breast cellulitis.  She met criteria for sepsis on admission with tachycardia tachypnea and leukopenia  Blood cultures positive Streptococcus  ID consulted  Echo, blood cultures  Antibiotics narrowed to penicillin  Her blood pressure has been soft hold Lasix Patient has history of bilateral pleural effusion but currently her chest x-ray is clear IV fluids BP soft 98/64   #2 ER positive stage IV breast CA on arimidex   #3 macrocytic anemia stable follow on folic acid   #4 DVT prophylaxis scd Lovenox stopped due to platelets trending down  #5 hypoalbuminemia  encourage p.o. intake  #6 depression on Zoloft   Pressure Injury 12/25/18 Sacrum Mid Deep Tissue Injury - Purple or maroon localized area of discolored intact skin or blood-filled blister due to damage of underlying soft tissue from pressure and/or shear. evolving into stage 2 in patchy areas when asse (Active)  12/25/18 1700  Location: Sacrum  Location Orientation: Mid  Staging: Deep Tissue Injury - Purple or maroon localized area of discolored intact skin or blood-filled blister due to damage of underlying soft tissue from pressure and/or shear.  Wound Description (Comments): evolving into stage 2 in patchy areas when assessed on 8/2  Present on Admission: Yes    Estimated body mass index is 37.4 kg/m as calculated from the following:   Height as of this encounter: _0  (1.499 m).   Weight as of this encounter: 84 kg.  DVT prophylaxis:scd Code Status: Full code Family Communication: None at bedside Disposition Plan:  Status is: Inpatient Remains inpatient appropriate because: Streptococcal bacteremia   Consultants: Infectious disease  Procedures: None Antimicrobials: Penicillin  Subjective: Patient is resting in bed she reports her right breast with decreased erythema edema and pain  Objective: Vitals:   12/06/21 2145 12/07/21 0100 12/07/21 0503 12/07/21 0946  BP: 118/64 (!) 115/57 128/66 98/64  Pulse: 90 92 92 77  Resp: _1 Temp: 98.3 F (36.8 C) 98.3 F (36.8 C) 100 F (37.8 C) 99.4 F (37.4 C)  TempSrc: Oral Oral Oral Oral  SpO2: 100% 100% 98% 100%  Weight:      Height:  Intake/Output Summary (Last 24 hours) at 12/07/2021 1339 Last data filed at 12/07/2021 1111 Gross per 24 hour  Intake 1638.31 ml  Output --  Net 1638.31 ml   Filed Weights   12/06/21 1427  Weight: 84 kg    Examination:  General exam: Appears not in acute distress Chest right breast with decreased erythema and edema and tenderness with erythema under the right  breast Respiratory system: Clear to auscultation. Respiratory effort normal. Cardiovascular system: S1 & S2 heard, RRR. No JVD, murmurs, rubs, gallops or clicks. No pedal edema. Gastrointestinal system: Abdomen is nondistended, soft and nontender. No organomegaly or masses felt. Normal bowel sounds heard. Central nervous system: Alert and oriented. No focal neurological deficits. Extremities: Symmetric 5 x 5 power. Skin: No rashes, lesions or ulcers Psychiatry: Judgement and insight appear normal. Mood & affect appropriate.     Data Reviewed: I have personally reviewed following labs and imaging studies  CBC: Recent Labs  Lab 12/06/21 1420 12/06/21 1715 12/07/21 0358  WBC 2.7* 3.2* 2.3*  NEUTROABS 2.3  --   --   HGB 10.3* 8.0* 8.5*  HCT 30.7* 25.0* 26.3*  MCV 114.6* 117.9* 116.9*  PLT 152 134* 329*   Basic Metabolic Panel: Recent Labs  Lab 12/06/21 1420 12/06/21 1715 12/07/21 0358  NA 138  --  142  K 3.6  --  3.8  CL 107  --  113*  CO2 24  --  22  GLUCOSE 117*  --  115*  BUN 16  --  15  CREATININE 0.99 0.84 0.86  CALCIUM 9.0  --  8.4*   GFR: Estimated Creatinine Clearance: 63.7 mL/min (by C-G formula based on SCr of 0.86 mg/dL). Liver Function Tests: Recent Labs  Lab 12/06/21 1420 12/07/21 0358  AST 20 18  ALT 13 13  ALKPHOS 72 62  BILITOT 1.2 0.7  PROT 6.6 5.5*  ALBUMIN 3.3* 2.8*   No results for input(s): "LIPASE", "AMYLASE" in the last 168 hours. No results for input(s): "AMMONIA" in the last 168 hours. Coagulation Profile: Recent Labs  Lab 12/06/21 1420  INR 1.1   Cardiac Enzymes: No results for input(s): "CKTOTAL", "CKMB", "CKMBINDEX", "TROPONINI" in the last 168 hours. BNP (last 3 results) No results for input(s): "PROBNP" in the last 8760 hours. HbA1C: No results for input(s): "HGBA1C" in the last 72 hours. CBG: No results for input(s): "GLUCAP" in the last 168 hours. Lipid Profile: No results for input(s): "CHOL", "HDL", "LDLCALC",  "TRIG", "CHOLHDL", "LDLDIRECT" in the last 72 hours. Thyroid Function Tests: No results for input(s): "TSH", "T4TOTAL", "FREET4", "T3FREE", "THYROIDAB" in the last 72 hours. Anemia Panel: No results for input(s): "VITAMINB12", "FOLATE", "FERRITIN", "TIBC", "IRON", "RETICCTPCT" in the last 72 hours. Sepsis Labs: Recent Labs  Lab 12/06/21 1512 12/06/21 1607  LATICACIDVEN 1.0 0.8    Recent Results (from the past 240 hour(s))  Blood Culture (routine x 2)     Status: None (Preliminary result)   Collection Time: 12/06/21  2:35 PM   Specimen: BLOOD  Result Value Ref Range Status   Specimen Description   Final    BLOOD BLOOD LEFT ARM Performed at [facility], [place] [address] [place], [place] [address]    Special Requests   Final    BOTTLES DRAWN AEROBIC AND ANAEROBIC Blood Culture adequate volume Performed at [place] [address], [place], [place] [address]    Culture   Final    NO GROWTH < 24 HOURS Performed at [facility], [place] [address], [place], [place] [address]    Report Status PENDING  Incomplete  Blood Culture (routine x 2)     Status: None (Preliminary result)   Collection Time: 12/06/21  2:35 PM   Specimen: BLOOD  Result Value Ref Range Status   Specimen Description   Final    BLOOD BLOOD LEFT HAND Performed at [place] [address], [place], [place] [address]    Special Requests   Final    BOTTLES DRAWN AEROBIC AND ANAEROBIC Blood Culture adequate volume Performed at  [address], [place], [place] [address]    Culture  Setup Time   Final    GRAM POSITIVE COCCI AEROBIC BOTTLE ONLY CRITICAL RESULT CALLED TO, READ BACK BY AND VERIFIED WITH: PHARMD ELLEN JACKSON 12/07/21@ 4:42 BY TW Performed at [facility], [place] [address], [place], [place] [address]    Culture GRAM POSITIVE COCCI  Final   Report Status PENDING  Incomplete  Blood Culture ID Panel  (Reflexed)     Status: Abnormal   Collection Time: 12/06/21  2:35 PM  Result Value Ref Range Status   Enterococcus faecalis NOT DETECTED NOT DETECTED Final   Enterococcus Faecium NOT DETECTED NOT DETECTED Final   Listeria monocytogenes NOT DETECTED NOT DETECTED Final   Staphylococcus species NOT DETECTED NOT DETECTED Final   Staphylococcus aureus (BCID) NOT DETECTED NOT DETECTED Final   Staphylococcus epidermidis NOT DETECTED NOT DETECTED Final   Staphylococcus lugdunensis NOT DETECTED NOT DETECTED Final   Streptococcus species DETECTED (A) NOT DETECTED Final    Comment: CRITICAL RESULT CALLED TO, READ BACK BY AND VERIFIED WITH: PHARMD ELLEN JACKSON 12/07/21@ 4:42 BY TW    Streptococcus agalactiae DETECTED (A) NOT DETECTED Final    Comment: CRITICAL RESULT CALLED TO, READ BACK BY AND VERIFIED WITH: PHARMD ELLEN JACKSON 12/07/21@ 4:42 BY TW    Streptococcus pneumoniae NOT DETECTED NOT DETECTED Final   Streptococcus pyogenes NOT DETECTED NOT DETECTED Final   A.calcoaceticus-baumannii NOT DETECTED NOT DETECTED Final   Bacteroides fragilis NOT DETECTED NOT DETECTED Final   Enterobacterales NOT DETECTED NOT DETECTED Final   Enterobacter cloacae complex NOT DETECTED NOT DETECTED Final   Escherichia coli NOT DETECTED NOT DETECTED Final   Klebsiella aerogenes NOT DETECTED NOT DETECTED Final   Klebsiella oxytoca NOT DETECTED NOT DETECTED Final   Klebsiella pneumoniae NOT DETECTED NOT DETECTED Final   Proteus species NOT DETECTED NOT DETECTED Final   Salmonella species NOT DETECTED NOT DETECTED Final   Serratia marcescens NOT DETECTED NOT DETECTED Final   Haemophilus influenzae NOT DETECTED NOT DETECTED Final   Neisseria meningitidis NOT DETECTED NOT DETECTED Final   Pseudomonas aeruginosa NOT DETECTED NOT DETECTED Final   Stenotrophomonas maltophilia NOT DETECTED NOT DETECTED Final   Candida albicans NOT DETECTED NOT DETECTED Final   Candida auris NOT DETECTED NOT DETECTED Final   Candida  glabrata NOT DETECTED NOT DETECTED Final   Candida krusei NOT DETECTED NOT DETECTED Final   Candida parapsilosis NOT DETECTED NOT DETECTED Final   Candida tropicalis NOT DETECTED NOT DETECTED Final   Cryptococcus neoformans/gattii NOT DETECTED NOT DETECTED Final    Comment: Performed at [facility], [address], [place], [place] [address]         Radiology Studies: DG Chest Port 1 View  Result Date: 12/06/2021 CLINICAL DATA:  Sepsis. EXAM: PORTABLE CHEST 1 VIEW COMPARISON:  CT chest abdomen and pelvis 08/08/2021 FINDINGS: There is stable  moderate elevation of the right hemidiaphragm. The heart size and mediastinal contours are within normal limits. Both lungs are clear. Expansile sclerotic lesion in the medial right clavicle again noted. Additional sclerotic osseous lesions are seen throughout the visualized skeletal structures. There are multiple healed left-sided rib fractures. IMPRESSION: 1. No acute cardiopulmonary process. 2. Stable diffuse osseous sclerotic metastatic disease. Electronically Signed   By: Ronney Asters M.D.   On: 12/06/2021 15:16        Scheduled Meds:  anastrozole  1 mg Oral Daily   calcium-vitamin D  1 tablet Oral BID   enoxaparin (LOVENOX) injection  40 mg Subcutaneous Daily   folic acid  1 mg Oral Daily   ketoconazole   Topical Daily   loratadine  10 mg Oral Daily   melatonin  1.5 mg Oral QHS   sertraline  75 mg Oral Daily   Continuous Infusions:  sodium chloride 75 mL/hr at 12/07/21 1331   penicillin G potassium 12 Million Units in dextrose 5 % 500 mL continuous infusion 41.7 mL/hr at 12/07/21 1111     LOS: 1 day    Time spent: 33 min Georgette Shell, MD  12/07/2021, 1:39 PM

## 2021-12-07 NOTE — Plan of Care (Signed)

## 2021-12-07 NOTE — Consult Note (Addendum)
[facility]                                                                                        Patient Identification: Patient Name: Suzanne [name] MRN: [id number] [place] Date: 12/06/2021  2:14 PM Today's Date: 12/07/2021 Reason for consult:  Requesting provider:   Principal Problem:   Sepsis due to cellulitis [facility]) Active Problems:   Breast skin changes   Macrocytic anemia   Malignant neoplasm metastatic to bone [facility])   Malignant neoplasm of overlapping sites of right breast in female, estrogen receptor positive ([place])   Hypoalbuminemia   Essential hypertension   Antibiotics:  Vancomycin 7/12- Cefepime 7/12-  Lines/Hardware: ORIF left distal radius #  Assessment 61 year old female with PMH of Obesity, 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 Please call with questions or concerns.  Thank you for the consult  Rosiland Oz, MD Infectious Disease Physician [facility] for Infectious Disease [address] [place], [place] [address] Phone: [phone]  Fax: [phone]  __________________________________________________________________________________________________________ HPI and Hospital Course: 61 year old female with PMH of Obesity, Rt sided inflammatory breast cancer  on immunotherapy (anastrozole , palbociclib , Xgeva ) with diffuse bony metastasis including lung and lymph nodes , MSSA bacteremia in &/2020 s/p 4 weeks of IV cefazolin who presented to the ED on 7/12 with subjective fever and chills since Tuesday. She was noted to have redness and warmth to her right breast at ED.  Mild cough.  Denies shortness of breath, chest pain. Denies nausea, vomiting diarrhea or abdominal pain or urinary complaints. Appetite has been poor for the last few days with increasing generalized weakness. She was treated for rt breast cellulitis in her rt breast in end of Db/early march 2023.   At ED Tmax 100, WBC 2.7 7/12 blood cultures positive for strep agalactiae in 1 set out of 2 sets  ROS: all systems reviewed with pertinent positives and negatives as listed above  Past Medical History:  Diagnosis Date   Cancer ([place])    Family history of breast cancer    Family history of colon cancer    Family history of melanoma    Family history of prostate cancer    Gallstones    Glaucoma    Mastitis    reports history of recurrent mastitis   Metastatic breast cancer    Obesity    Past Surgical History:  Procedure Laterality Date   GLAUCOMA SURGERY  late 1990's   IR THORACENTESIS ASP PLEURAL SPACE W/IMG GUIDE  10/28/2018   IR THORACENTESIS ASP PLEURAL  SPACE W/IMG GUIDE  10/29/2018   OPEN REDUCTION INTERNAL FIXATION (ORIF) DISTAL RADIAL FRACTURE Left 01/09/2019   Procedure: OPEN REDUCTION INTERNAL FIXATION (ORIF) HUMERAL FRACTURE;  Surgeon: Netta Cedars, MD;  Location: [facility];  Service: Orthopedics;  Laterality: Left;     Scheduled Meds:  anastrozole  1 mg Oral Daily   calcium-vitamin D  1 tablet Oral BID   enoxaparin (LOVENOX) injection  40 mg Subcutaneous Daily   folic acid  1 mg Oral Daily   loratadine  10 mg Oral Daily   melatonin  1.5 mg Oral QHS   sertraline  75 mg Oral Daily   Continuous Infusions:  sodium chloride 75 mL/hr at 12/06/21 2356   ceFEPime (MAXIPIME)  IV 2 g (12/06/21 1825)   vancomycin     PRN Meds:.acetaminophen, famotidine  Allergies  Allergen Reactions   Ferumoxytol Other (See Comments)    Pt had hypersensitivity to Ferumoxytol. She had Facial Flushing. See note from 07/14/2021   Social History   Socioeconomic History   Marital status: Widowed    Spouse name: Not on file   Number of children: 1   Years of education: Not on file   Highest education level: Not on file  Occupational History   Occupation: Clinical research associate Programs    Comment: Self-employed  Tobacco Use   Smoking status: Never   Smokeless tobacco: Never   Tobacco comments:    second hand smoke exposure  Vaping Use   Vaping Use: Never used  Substance and Sexual Activity   Alcohol use: No   Drug use: No   Sexual activity: Not Currently  Other Topics Concern   Not on file  Social History Narrative   Has a daughter named [name]. Daughter has CP.   Social Determinants of Health   Financial Resource Strain: Low Risk  (08/16/2021)   Overall Financial Resource Strain (CARDIA)    Difficulty of Paying Living Expenses: Not hard at all  Food Insecurity: Food Insecurity Present (11/08/2021)   Hunger Vital Sign    Worried About Running Out of Food in the Last Year: Sometimes true    Ran Out of Food in the Last Year: Sometimes true  Transportation Needs: No Transportation Needs (09/15/2021)   PRAPARE - Hydrologist (Medical): No    Lack of Transportation (Non-Medical): No  Physical Activity: Not on file  Stress: Stress Concern Present (11/08/2021)   [place]    Feeling of Stress : To some extent  Social Connections: Socially Isolated (03/27/2021)   Social Connection and Isolation Panel [NHANES]    Frequency of Communication with Friends and Family: Once a week    Frequency of Social Gatherings with Friends and Family: More than three times a week    Attends  Religious Services: Never    Marine scientist or Organizations: No    Attends Archivist Meetings: Never    Marital Status: Widowed  Intimate Partner Violence: Not At Risk (12/24/2018)   Humiliation, Afraid, Rape, and Kick questionnaire    Fear of Current or Ex-Partner: No    Emotionally Abused: No    Physically Abused: No    Sexually Abused: No   Family History  Problem Relation Age of Onset   Breast cancer Sister        in her 36's-70s   Heart disease Brother        CABG   Heart disease Brother  CABG   Skin cancer Brother        melanoma   Colon cancer Cousin    Heart attack Mother    Heart attack Father    Prostate cancer Brother     Vitals BP 128/66 (BP Location: Right Arm)   Pulse 92   Temp 100 F (37.8 C) (Oral)   Resp 17   Ht '4\' 11"'$  (1.499 m)   Wt 84 kg   LMP 05/28/2013 (Within Months)   SpO2 98%   BMI 37.40 kg/m    Physical Exam Constitutional:  lying in the bed, not in acute distress    Comments:   Cardiovascular:     Rate and Rhythm: Normal rate and regular rhythm.     Heart sounds:   Pulmonary:     Effort: Pulmonary effort is normal on [place]    Comments:  Normal breath sounds   Abdominal:     Palpations: Abdomen is soft.     Tenderness: non distended and non tender   Musculoskeletal:        General: No swelling or tenderness.   Skin:    Comments: pendulous b/l breast with clearing redness, no warmth and tenderness, rt submammary region with dry, flaking skin with complains of itching by patient and erythema   Neurological:     General: Grossly non focal, awake, alert and oriented   Psychiatric:        Mood and Affect: Mood normal.    Pertinent Microbiology Results for orders placed or performed during the hospital encounter of 12/06/21  Blood Culture (routine x 2)     Status: None (Preliminary result)   Collection Time: 12/06/21  2:35 PM   Specimen: BLOOD  Result Value Ref Range Status   Specimen Description    Final    BLOOD BLOOD LEFT ARM Performed at [facility] [address], [place], [place] [address]    Special Requests   Final    BOTTLES DRAWN AEROBIC AND ANAEROBIC Blood Culture adequate volume Performed at [facility] [address], [place], [place] [address]    Culture   Final    NO GROWTH < 24 HOURS Performed at [facility] [address], [place], [name] [address]    Report Status PENDING  Incomplete  Blood Culture (routine x 2)     Status: None (Preliminary result)   Collection Time: 12/06/21  2:35 PM   Specimen: BLOOD  Result Value Ref Range Status   Specimen Description   Final    BLOOD BLOOD LEFT HAND Performed at [facility] [address], [place], [place] [address]    Special Requests   Final    BOTTLES DRAWN AEROBIC AND ANAEROBIC Blood Culture adequate volume Performed at [facility] [address], [place], [name] [address]    Culture  Setup Time   Final    GRAM POSITIVE COCCI AEROBIC BOTTLE ONLY CRITICAL RESULT CALLED TO, READ BACK BY AND VERIFIED WITH: PHARMD ELLEN JACKSON 12/07/21@ 4:42 BY TW Performed at [facility], [place] [address], [place], [place] [address]    Culture [facility] POSITIVE COCCI  Final   Report Status PENDING  Incomplete  Blood Culture ID Panel (Reflexed)     Status: Abnormal   Collection Time: 12/06/21  2:35 PM  Result Value Ref Range Status   Enterococcus faecalis NOT DETECTED NOT DETECTED Final   Enterococcus Faecium NOT DETECTED NOT DETECTED Final  Listeria monocytogenes NOT DETECTED NOT DETECTED Final   Staphylococcus species NOT DETECTED NOT DETECTED Final   Staphylococcus aureus (BCID) NOT DETECTED NOT DETECTED Final   Staphylococcus epidermidis NOT DETECTED NOT DETECTED Final   Staphylococcus lugdunensis NOT DETECTED NOT DETECTED Final   Streptococcus species DETECTED (A) NOT DETECTED Final    Comment: CRITICAL RESULT CALLED TO, READ  BACK BY AND VERIFIED WITH: PHARMD ELLEN JACKSON 12/07/21@ 4:42 BY TW    Streptococcus agalactiae DETECTED (A) NOT DETECTED Final    Comment: CRITICAL RESULT CALLED TO, READ BACK BY AND VERIFIED WITH: PHARMD ELLEN JACKSON 12/07/21@ 4:42 BY TW    Streptococcus pneumoniae NOT DETECTED NOT DETECTED Final   Streptococcus pyogenes NOT DETECTED NOT DETECTED Final   A.calcoaceticus-baumannii NOT DETECTED NOT DETECTED Final   Bacteroides fragilis NOT DETECTED NOT DETECTED Final   Enterobacterales NOT DETECTED NOT DETECTED Final   Enterobacter cloacae complex NOT DETECTED NOT DETECTED Final   Escherichia coli NOT DETECTED NOT DETECTED Final   Klebsiella aerogenes NOT DETECTED NOT DETECTED Final   Klebsiella oxytoca NOT DETECTED NOT DETECTED Final   Klebsiella pneumoniae NOT DETECTED NOT DETECTED Final   Proteus species NOT DETECTED NOT DETECTED Final   Salmonella species NOT DETECTED NOT DETECTED Final   Serratia marcescens NOT DETECTED NOT DETECTED Final   Haemophilus influenzae NOT DETECTED NOT DETECTED Final   Neisseria meningitidis NOT DETECTED NOT DETECTED Final   Pseudomonas aeruginosa NOT DETECTED NOT DETECTED Final   Stenotrophomonas maltophilia NOT DETECTED NOT DETECTED Final   Candida albicans NOT DETECTED NOT DETECTED Final   Candida auris NOT DETECTED NOT DETECTED Final   Candida glabrata NOT DETECTED NOT DETECTED Final   Candida krusei NOT DETECTED NOT DETECTED Final   Candida parapsilosis NOT DETECTED NOT DETECTED Final   Candida tropicalis NOT DETECTED NOT DETECTED Final   Cryptococcus neoformans/gattii NOT DETECTED NOT DETECTED Final    Comment: Performed at [facility], [address] [place], [place] [address]     Pertinent Lab seen by me:    Latest Ref Rng & Units 12/07/2021    3:58 AM 12/06/2021    5:15 PM 12/06/2021    2:20 PM  CBC  WBC 4.0 - 10.5 K/uL 2.3  3.2  2.7   Hemoglobin 12.0 - 15.0 g/dL 8.5  8.0  10.3   Hematocrit 36.0 - 46.0 % 26.3  25.0  30.7    Platelets 150 - 400 K/uL 127  134  152       Latest Ref Rng & Units 12/07/2021    3:58 AM 12/06/2021    5:15 PM 12/06/2021    2:20 PM  CMP  Glucose 70 - 99 mg/dL 115   117   BUN 8 - 23 mg/dL 15   16   Creatinine 0.44 - 1.00 mg/dL 0.86  0.84  0.99   Sodium 135 - 145 mmol/L 142   138   Potassium 3.5 - 5.1 mmol/L 3.8   3.6   Chloride 98 - 111 mmol/L 113   107   CO2 22 - 32 mmol/L 22   24   Calcium 8.9 - 10.3 mg/dL 8.4   9.0   Total Protein 6.5 - 8.1 g/dL 5.5   6.6   Total Bilirubin 0.3 - 1.2 mg/dL 0.7   1.2   Alkaline Phos 38 - 126 U/L 62   72   AST 15 - 41 U/L 18   20   ALT 0 - 44 U/L 13  13      Pertinent Imagings/Other Imagings Plain films and CT images have been personally visualized and interpreted; radiology reports have been reviewed. Decision making incorporated into the Impression / Recommendations.  DG Chest Port 1 View  Result Date: 12/06/2021 CLINICAL DATA:  Sepsis. EXAM: PORTABLE CHEST 1 VIEW COMPARISON:  CT chest abdomen and pelvis 08/08/2021 FINDINGS: There is stable moderate elevation of the right hemidiaphragm. The heart size and mediastinal contours are within normal limits. Both lungs are clear. Expansile sclerotic lesion in the medial right clavicle again noted. Additional sclerotic osseous lesions are seen throughout the visualized skeletal structures. There are multiple healed left-sided rib fractures. IMPRESSION: 1. No acute cardiopulmonary process. 2. Stable diffuse osseous sclerotic metastatic disease. Electronically Signed   By: Ronney Asters M.D.   On: 12/06/2021 15:16     I spent more than 80 minutes for this patient encounter including review of prior medical records/discussing diagnostics and treatment plan with the patient/family/coordinate care with primary/other specialits with greater than 50% of time in face to face encounter.   Electronically signed by:   Rosiland Oz, MD Infectious Disease Physician [facility] for  Infectious Disease Pager: [phone]

## 2021-12-08 ENCOUNTER — Ambulatory Visit (HOSPITAL_COMMUNITY): Payer: Medicaid Other

## 2021-12-08 ENCOUNTER — Inpatient Hospital Stay (HOSPITAL_COMMUNITY): Payer: Medicaid Other

## 2021-12-08 DIAGNOSIS — R7881 Bacteremia: Secondary | ICD-10-CM

## 2021-12-08 DIAGNOSIS — L0390 Cellulitis, unspecified: Secondary | ICD-10-CM | POA: Diagnosis not present

## 2021-12-08 DIAGNOSIS — A419 Sepsis, unspecified organism: Secondary | ICD-10-CM | POA: Diagnosis not present

## 2021-12-08 LAB — ECHOCARDIOGRAM COMPLETE
AR max vel: 1.88 cm2
AV Peak grad: 8.4 mmHg
Ao pk vel: 1.45 m/s
Area-P 1/2: 4.57 cm2
Calc EF: 58.9 %
Height: 59 in
S' Lateral: 2.4 cm
Single Plane A2C EF: 57.5 %
Single Plane A4C EF: 58.6 %
Weight: 2962.98 oz

## 2021-12-08 LAB — CBC
HCT: 24.2 % — ABNORMAL LOW (ref 36.0–46.0)
Hemoglobin: 7.9 g/dL — ABNORMAL LOW (ref 12.0–15.0)
MCH: 37.8 pg — ABNORMAL HIGH (ref 26.0–34.0)
MCHC: 32.6 g/dL (ref 30.0–36.0)
MCV: 115.8 fL — ABNORMAL HIGH (ref 80.0–100.0)
Platelets: 172 10*3/uL (ref 150–400)
RBC: 2.09 MIL/uL — ABNORMAL LOW (ref 3.87–5.11)
RDW: 14.3 % (ref 11.5–15.5)
WBC: 1.9 10*3/uL — ABNORMAL LOW (ref 4.0–10.5)
nRBC: 0 % (ref 0.0–0.2)

## 2021-12-08 LAB — COMPREHENSIVE METABOLIC PANEL
ALT: 13 U/L (ref 0–44)
AST: 18 U/L (ref 15–41)
Albumin: 2.7 g/dL — ABNORMAL LOW (ref 3.5–5.0)
Alkaline Phosphatase: 63 U/L (ref 38–126)
Anion gap: 4 — ABNORMAL LOW (ref 5–15)
BUN: 7 mg/dL — ABNORMAL LOW (ref 8–23)
CO2: 26 mmol/L (ref 22–32)
Calcium: 8.1 mg/dL — ABNORMAL LOW (ref 8.9–10.3)
Chloride: 109 mmol/L (ref 98–111)
Creatinine, Ser: 0.63 mg/dL (ref 0.44–1.00)
GFR, Estimated: >60 mL/min (ref >60)
Glucose, Bld: 105 mg/dL — ABNORMAL HIGH (ref 70–99)
Potassium: 3.7 mmol/L (ref 3.5–5.1)
Sodium: 139 mmol/L (ref 135–145)
Total Bilirubin: 0.4 mg/dL (ref 0.3–1.2)
Total Protein: 5.7 g/dL — ABNORMAL LOW (ref 6.5–8.1)

## 2021-12-08 LAB — URINE CULTURE

## 2021-12-08 MED ORDER — LOPERAMIDE HCL 2 MG PO CAPS
2.0000 mg | ORAL_CAPSULE | ORAL | Status: DC | PRN
  Administered 2021-12-08: 2 mg via ORAL
  Filled 2021-12-08: qty 1

## 2021-12-08 NOTE — Progress Notes (Addendum)
[facility] Infectious Diseases Follow Up Note  Patient Identification: Patient Name: Suzanne [name] MRN: [id number] [place] Date: 12/06/2021  2:14 PM Age: 61 y.o.Today's Date: 12/08/2021  Reason for Visit: bacteremia  Principal Problem:   Sepsis due to cellulitis [facility]) Active Problems:   Breast skin changes   Macrocytic anemia   Malignant neoplasm metastatic to bone [facility])   Malignant neoplasm of overlapping sites of right breast in female, estrogen receptor positive ([place])   Hypoalbuminemia   Essential hypertension  Antibiotics:  Vancomycin 7/12 Cefepime 7/12 Pen G 6/13-c   Lines/Hardware: ORIF left distal radius #   Interval Events: Afebrile, WBC 1.9   Assessment 61 year old female with PMH of Obesity, 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 (anastrozole , palbociclib , Xgeva ) with diffuse bony metastasis including lung and lymph nodes with h/o recurrent mastitis, MSSA bacteremia in &/2020 s/p 4 weeks of IV cefazolin admitted with    #Low-grade Streptococcus or collected bacteremia secondary to #Breast cellulitis, clinically improved #Possible candidal intertrigo of right submammary region - topical antifungal cream   Recommendations Continue penicillin G Follow-up repeat blood cultures 7/13 and sensi of strep agalactiae Will forego TEE in case repeat blood cultures are negative given low-grade bacteremia likely secondary to cellulitis If repeat blood cultures are negative in 48 hours no new concerns, okay to switch to p.o. antibiotics as appropriate depending sensitivities  for 2 weeks course from date of negative blood cultures  Dr Candiss Norse covering this weekend and will follow cultures and sensi.   Rest of the management as per the primary team. Thank you for the consult. Please page with pertinent questions or  concerns.  ______________________________________________________________________ Subjective patient seen and examined at the bedside.  Some loose stools Denies nausea, vomiting or abdominal pain No swelling/erythema or warmth in the right breast  Vitals BP 118/60 (BP Location: Right Arm)   Pulse 81   Temp 98.5 F (36.9 C) (Oral)   Resp 16   Ht '4\' 11"'$  (1.499 m)   Wt 84 kg   LMP 05/28/2013 (Within Months)   SpO2 99%   BMI 37.40 kg/m     Physical Exam Constitutional: Not in acute distress, having lunch    Comments:   Cardiovascular:     Rate and Rhythm: Normal rate and regular rhythm.     Heart sounds: No peripheral stigmata of endocarditis  Pulmonary:     Effort: Pulmonary effort is normal.     Comments: Normal breath sounds  Abdominal:     Palpations: Abdomen is soft.     Tenderness: Nontender and nondistended  Musculoskeletal:        General: No swelling or tenderness.   Skin:    Comments: Improved erythema in the right breast.  No swelling and tenderness.  Dry skin with peeling of skin and mild erythema in the right submammary region  Neurological:     General: Grossly nonfocal, awake alert and oriented  Psychiatric:        Mood and Affect: Mood normal.   Pertinent Microbiology Results for orders placed or performed during the hospital encounter of 12/06/21  Blood Culture (routine x 2)     Status: None (Preliminary result)   Collection Time: 12/06/21  2:35 PM   Specimen: BLOOD  Result Value Ref Range Status   Specimen Description   Final    BLOOD BLOOD LEFT ARM Performed at [facility] [address] [place], [place] [address]    Special Requests   Final  BOTTLES DRAWN AEROBIC AND ANAEROBIC Blood Culture adequate volume Performed at [facility] [address], [place], [place] [address]    Culture   Final    NO GROWTH 2 DAYS Performed at [facility] [address] [place], [place] [address]     Report Status PENDING  Incomplete  Blood Culture (routine x 2)     Status: Abnormal (Preliminary result)   Collection Time: 12/06/21  2:35 PM   Specimen: BLOOD  Result Value Ref Range Status   Specimen Description   Final    BLOOD BLOOD LEFT HAND Performed at [facility] [address], [place], [place] [address]    Special Requests   Final    BOTTLES DRAWN AEROBIC AND ANAEROBIC Blood Culture adequate volume Performed at [facility] [address] [place], [place] [address]    Culture  Setup Time   Final    GRAM POSITIVE COCCI AEROBIC BOTTLE ONLY CRITICAL RESULT CALLED TO, READ BACK BY AND VERIFIED WITH: PHARMD ELLEN JACKSON 12/07/21@ 4:42 BY TW    Culture (A)  Final    GROUP B STREP(S.AGALACTIAE)ISOLATED SUSCEPTIBILITIES TO FOLLOW Performed at [facility], [place] [address], [place],  [address]    Report Status PENDING  Incomplete  Blood Culture ID Panel (Reflexed)     Status: Abnormal   Collection Time: 12/06/21  2:35 PM  Result Value Ref Range Status   Enterococcus faecalis NOT DETECTED NOT DETECTED Final   Enterococcus Faecium NOT DETECTED NOT DETECTED Final   Listeria monocytogenes NOT DETECTED NOT DETECTED Final   Staphylococcus species NOT DETECTED NOT DETECTED Final   Staphylococcus aureus (BCID) NOT DETECTED NOT DETECTED Final   Staphylococcus epidermidis NOT DETECTED NOT DETECTED Final   Staphylococcus lugdunensis NOT DETECTED NOT DETECTED Final   Streptococcus species DETECTED (A) NOT DETECTED Final    Comment: CRITICAL RESULT CALLED TO, READ BACK BY AND VERIFIED WITH: PHARMD ELLEN JACKSON 12/07/21@ 4:42 BY TW    Streptococcus agalactiae DETECTED (A) NOT DETECTED Final    Comment: CRITICAL RESULT CALLED TO, READ BACK BY AND VERIFIED WITH: PHARMD ELLEN JACKSON 12/07/21@ 4:42 BY TW    Streptococcus pneumoniae NOT DETECTED NOT DETECTED Final   Streptococcus pyogenes NOT DETECTED NOT DETECTED Final    A.calcoaceticus-baumannii NOT DETECTED NOT DETECTED Final   Bacteroides fragilis NOT DETECTED NOT DETECTED Final   Enterobacterales NOT DETECTED NOT DETECTED Final   Enterobacter cloacae complex NOT DETECTED NOT DETECTED Final   Escherichia coli NOT DETECTED NOT DETECTED Final   Klebsiella aerogenes NOT DETECTED NOT DETECTED Final   Klebsiella oxytoca NOT DETECTED NOT DETECTED Final   Klebsiella pneumoniae NOT DETECTED NOT DETECTED Final   Proteus species NOT DETECTED NOT DETECTED Final   Salmonella species NOT DETECTED NOT DETECTED Final   Serratia marcescens NOT DETECTED NOT DETECTED Final   Haemophilus influenzae NOT DETECTED NOT DETECTED Final   Neisseria meningitidis NOT DETECTED NOT DETECTED Final   Pseudomonas aeruginosa NOT DETECTED NOT DETECTED Final   Stenotrophomonas maltophilia NOT DETECTED NOT DETECTED Final   Candida albicans NOT DETECTED NOT DETECTED Final   Candida auris NOT DETECTED NOT DETECTED Final   Candida glabrata NOT DETECTED NOT DETECTED Final   Candida krusei NOT DETECTED NOT DETECTED Final   Candida parapsilosis NOT DETECTED NOT DETECTED Final   Candida tropicalis NOT DETECTED NOT DETECTED Final   Cryptococcus neoformans/gattii NOT DETECTED NOT DETECTED Final    Comment: Performed at [facility], [address], [place], [place] [address]  Urine Culture     Status: Abnormal   Collection Time: 12/06/21  4:20 PM   Specimen: In/Out Cath Urine  Result Value Ref Range Status   Specimen Description   Final    IN/OUT CATH URINE Performed at [name] [address], [place], [place] [address]    Special Requests   Final    NONE Performed at [facility], [place] [address], [place], [place] [address]    Culture MULTIPLE SPECIES PRESENT, SUGGEST RECOLLECTION (A)  Final   Report Status 12/08/2021 FINAL  Final  Culture, blood (Routine X 2) w Reflex to ID Panel     Status: None (Preliminary result)   Collection Time:  12/07/21 10:52 AM   Specimen: BLOOD  Result Value Ref Range Status   Specimen Description   Final    BLOOD RIGHT ANTECUBITAL Performed at [facility] [address] [place], [place] [address]    Special Requests   Final    BOTTLES DRAWN AEROBIC ONLY Blood Culture adequate volume Performed at [facility] [address] [place], [place] [address]    Culture   Final    NO GROWTH < 24 HOURS Performed at [facility] [address], [place], [place] [address]    Report Status PENDING  Incomplete  Culture, blood (Routine X 2) w Reflex to ID Panel     Status: None (Preliminary result)   Collection Time: 12/07/21 10:53 AM   Specimen: BLOOD RIGHT WRIST  Result Value Ref Range Status   Specimen Description   Final    BLOOD RIGHT WRIST Performed at [facility] [address] [place], [place] [address]    Special Requests   Final    BOTTLES DRAWN AEROBIC ONLY Blood Culture adequate volume Performed at [facility] [address], [place], [place] [address]    Culture   Final    NO GROWTH < 24 HOURS Performed at [facility] [address], [place], [place] [address]    Report Status PENDING  Incomplete    Pertinent Lab.    Latest Ref Rng & Units 12/08/2021   12:20 PM 12/07/2021    3:58 AM 12/06/2021    5:15 PM  CBC  WBC 4.0 - 10.5 K/uL 1.9  2.3  3.2   Hemoglobin 12.0 - 15.0 g/dL 7.9  8.5  8.0   Hematocrit 36.0 - 46.0 % 24.2  26.3  25.0   Platelets 150 - 400 K/uL 172  127  134       Latest Ref Rng & Units 12/08/2021   12:20 PM 12/07/2021    3:58 AM 12/06/2021    5:15 PM  CMP  Glucose 70 - 99 mg/dL 105  115    BUN 8 - 23 mg/dL 7  15    Creatinine 0.44 - 1.00 mg/dL 0.63  0.86  0.84   Sodium 135 - 145 mmol/L 139  142    Potassium 3.5 - 5.1 mmol/L 3.7  3.8    Chloride 98 - 111 mmol/L 109  113    CO2 22 - 32 mmol/L 26  22    Calcium 8.9 - 10.3 mg/dL 8.1  8.4    Total Protein 6.5 -  8.1 g/dL 5.7  5.5    Total Bilirubin 0.3 - 1.2 mg/dL 0.4  0.7    Alkaline Phos 38 - 126  U/L 63  62    AST 15 - 41 U/L 18  18    ALT 0 - 44 U/L 13  13       Pertinent Imaging today Plain films and CT images have been personally visualized and interpreted; radiology reports have been reviewed. Decision making incorporated into the Impression / Recommendations.  ECHOCARDIOGRAM COMPLETE  Result Date: 12/08/2021    ECHOCARDIOGRAM REPORT   Patient Name:   BHAVIKA SCHNIDER Date of Exam: 12/08/2021 Medical Rec #:  [id number]       Height:       59.0 in Accession #:    [id number]      Weight:       185.2 lb Date of Birth:  02-08-1961       BSA:          1.785 m Patient Age:    35 years        BP:           123/73 mmHg Patient Gender: F               HR:           85 bpm. Exam Location:  Inpatient Procedure: 2D Echo, Cardiac Doppler and Color Doppler Indications:    Bacteremia  History:        Patient has prior history of Echocardiogram examinations. Risk                 Factors:Hypertension.  Sonographer:    Jyl Heinz Referring Phys: [id number] [facility]  Sonographer Comments: Patient is morbidly obese. Image acquisition challenging due to patient body habitus. Pt sitting up. IMPRESSIONS  1. Left ventricular ejection fraction, by estimation, is 70 to 75%. The left ventricle has hyperdynamic function. The left ventricle has no regional wall motion abnormalities. Left ventricular diastolic parameters were normal.  2. Right ventricular systolic function is normal. The right ventricular size is normal.  3. The mitral valve is normal in structure. No evidence of mitral valve regurgitation. No evidence of mitral stenosis.  4. The aortic valve is normal in structure. Aortic valve regurgitation is not visualized. No aortic stenosis is present.  5. The inferior vena cava is normal in size with greater than 50% respiratory variability, suggesting right atrial pressure of 3 mmHg. Conclusion(s)/Recommendation(s): No  evidence of valvular vegetations on this transthoracic echocardiogram. Consider a transesophageal echocardiogram to exclude infective endocarditis if clinically indicated. FINDINGS  Left Ventricle: Left ventricular ejection fraction, by estimation, is 70 to 75%. The left ventricle has hyperdynamic function. The left ventricle has no regional wall motion abnormalities. The left ventricular internal cavity size was normal in size. There is no left ventricular hypertrophy. Left ventricular diastolic parameters were normal. Right Ventricle: The right ventricular size is normal. No increase in right ventricular wall thickness. Right ventricular systolic function is normal. Left Atrium: Left atrial size was normal in size. Right Atrium: Right atrial size was normal in size. Pericardium: There is no evidence of pericardial effusion. Mitral Valve: The mitral valve is normal in structure. Mild mitral annular calcification. No evidence of mitral valve regurgitation. No evidence of mitral valve stenosis. Tricuspid Valve: The tricuspid valve is normal in structure. Tricuspid valve regurgitation is not demonstrated. No evidence of tricuspid stenosis. Aortic Valve: The aortic valve is normal in structure. Aortic valve regurgitation is not visualized. No aortic stenosis is present. Aortic valve peak gradient measures 8.4 mmHg. Pulmonic Valve: The pulmonic valve was normal in structure. Pulmonic valve regurgitation  is not visualized. No evidence of pulmonic stenosis. Aorta: The aortic root is normal in size and structure. Venous: The inferior vena cava is normal in size with greater than 50% respiratory variability, suggesting right atrial pressure of 3 mmHg. IAS/Shunts: No atrial level shunt detected by color flow Doppler.  LEFT VENTRICLE PLAX 2D LVIDd:         3.60 cm     Diastology LVIDs:         2.40 cm     LV e' medial:    7.29 cm/s LV PW:         1.10 cm     LV E/e' medial:  11.9 LV IVS:        0.90 cm     LV e' lateral:    5.66 cm/s LVOT diam:     1.80 cm     LV E/e' lateral: 15.4 LV SV:         53 LV SV Index:   30 LVOT Area:     2.54 cm  LV Volumes (MOD) LV vol d, MOD A2C: 74.2 ml LV vol d, MOD A4C: 67.9 ml LV vol s, MOD A2C: 31.5 ml LV vol s, MOD A4C: 28.1 ml LV SV MOD A2C:     42.7 ml LV SV MOD A4C:     67.9 ml LV SV MOD BP:      44.1 ml RIGHT VENTRICLE RV Basal diam:  3.10 cm RV Mid diam:    2.60 cm RV S prime:     15.50 cm/s TAPSE (M-mode): 2.5 cm LEFT ATRIUM             Index        RIGHT ATRIUM           Index LA diam:        3.90 cm 2.18 cm/m   RA Area:     11.80 cm LA Vol (A2C):   33.3 ml 18.66 ml/m  RA Volume:   26.10 ml  14.62 ml/m LA Vol (A4C):   34.5 ml 19.33 ml/m LA Biplane Vol: 35.7 ml 20.00 ml/m  AORTIC VALVE AV Area (Vmax): 1.88 cm AV Vmax:        145.00 cm/s AV Peak Grad:   8.4 mmHg LVOT Vmax:      107.00 cm/s LVOT Vmean:     71.200 cm/s LVOT VTI:       0.210 m  AORTA Ao Root diam: 2.50 cm Ao Asc diam:  2.60 cm MITRAL VALVE MV Area (PHT): 4.57 cm    SHUNTS MV Decel Time: 166 msec    Systemic VTI:  0.21 m MV E velocity: 87.00 cm/s  Systemic Diam: 1.80 cm MV A velocity: 81.40 cm/s MV E/A ratio:  1.07 Candee Furbish MD Electronically signed by Candee Furbish MD Signature Date/Time: 12/08/2021/1:00:53 PM    Final    CT CHEST ABDOMEN PELVIS W CONTRAST  Result Date: 12/07/2021 CLINICAL DATA:  Invasive stage IV breast cancer. Assess treatment response. EXAM: CT CHEST, ABDOMEN, AND PELVIS WITH CONTRAST TECHNIQUE: Multidetector CT imaging of the chest, abdomen and pelvis was performed following the standard protocol during bolus administration of intravenous contrast. RADIATION DOSE REDUCTION: This exam was performed according to the departmental dose-optimization program which includes automated exposure control, adjustment of the mA and/or kV according to patient size and/or use of iterative reconstruction technique. CONTRAST:  166m OMNIPAQUE IOHEXOL 300 MG/ML  SOLN COMPARISON:  CT chest abdomen and pelvis  08/08/2021 FINDINGS: CT CHEST  FINDINGS Cardiovascular: No significant vascular findings. Normal heart size. No pericardial effusion. There are atherosclerotic calcifications of the aorta and coronary arteries. Mediastinum/Nodes: Moderate-sized hiatal hernia is unchanged. Esophagus is nondilated. There are no enlarged mediastinal, hilar, or axillary lymph nodes. The visualized thyroid gland is within normal limits. Lungs/Pleura: There is stable elevation of the right hemidiaphragm. There is a new small amount of airspace consolidation and atelectasis in the right middle lobe. Minimal multifocal ground-glass opacities are seen in the left upper lobe and left lower lobe new from prior examination. There are new trace bilateral pleural effusions. Linear areas of scarring bilaterally are otherwise unchanged from prior. No focal pulmonary nodules are seen. Musculoskeletal: Diffuse osseous sclerotic metastatic disease is again seen. Kyphosis at T11-T12 with compression deformities appears similar to the prior study. No acute fractures are seen. Healed rib fractures are again noted. Left shoulder arthroplasty hardware is partially visualized. CT ABDOMEN PELVIS FINDINGS Hepatobiliary: No focal liver lesions are seen. Gallstones are again seen. No biliary ductal dilatation. Pancreas: Unremarkable. No pancreatic ductal dilatation or surrounding inflammatory changes. Spleen: Normal in size without focal abnormality. Adrenals/Urinary Tract: Adrenal glands are unremarkable. Kidneys are normal, without renal calculi, focal lesion, or hydronephrosis. Bladder is unremarkable. Stomach/Bowel: Moderate size hiatal hernia is stable. Stomach is otherwise within normal limits. Appendix appears normal. No evidence of bowel wall thickening, distention, or inflammatory changes. Vascular/Lymphatic: Aortic atherosclerosis. No enlarged abdominal or pelvic lymph nodes. Reproductive: Uterus and bilateral adnexa are unremarkable. Other: Small fat  containing umbilical hernia. No ascites or free air. Musculoskeletal: Diffuse sclerotic osseous metastatic lesions are similar to the prior study. No acute fractures are seen. IMPRESSION: 1. There are new trace bilateral pleural effusions. 2. New patchy ground-glass opacities in the left lung are nonspecific and may represent mild edema or infectious/inflammatory process. 3. New small amount of atelectasis and airspace disease in the right middle lobe. 4. No evidence for metastatic disease or significant change in the abdomen or pelvis. 5. Stable diffuse sclerotic osseous metastatic disease. Electronically Signed   By: Ronney Asters M.D.   On: 12/07/2021 16:36     I spent more than 50 minutes for this patient encounter including review of prior medical records, coordination of care with primary/other specialist with greater than 50% of time being face to face/counseling and discussing diagnostics/treatment plan with the patient/family.  Electronically signed by:   Rosiland Oz, MD Infectious Disease Physician [facility] Pager: [phone]

## 2021-12-08 NOTE — Progress Notes (Signed)
PROGRESS NOTE    Suzanne [name]  KYH:[id number] DOB: 1961/04/16 DOA: 12/06/2021 PCP: [name], NP  Brief Narrative:  Suzanne [name] is a 61 y.o. 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 She is admitted with chills rigors and erythema redness and warmth to her right breast.  She also complains of a cough and is on oxygen at home.  She denies shortness of breath nausea vomiting diarrhea abdominal pain or urinary complaints.  Her appetite has been poor for the last few days with increasing generalized weakness.(On Lasix at home) She noticed her right breast was red today She was recently treated for cellulitis with antibiotics. Cxr negative stable diffuse osseous sclerotic metastatic dz Na 138 k 3.6 bun 16 cr 0.99  Wbc 2.7 LA 1.0 HB 10.3 platelets 152 She was tachycardic and tachypneic with leukopenia on admission blood pressure  soft Procalcitonin elevated  Assessment & Plan:   Principal Problem:   Sepsis due to cellulitis [facility]) Active Problems:   Breast skin changes   Macrocytic anemia   Malignant neoplasm metastatic to bone [facility])   Malignant neoplasm of overlapping sites of right breast in female, estrogen receptor positive ([place])   Hypoalbuminemia   Essential hypertension  #1 sepsis secondary to right breast cellulitis.  She met criteria for sepsis on admission with tachycardia tachypnea and leukopenia  Blood cultures positive Strept agalactae  ID following Echo done results pending Antibiotics narrowed to penicillin  Her blood pressure has been soft hold Lasix Patient has history of bilateral pleural effusion but currently her chest x-ray is clear and is on RA  DC IV fluids BP better   #2 ER positive stage IV breast CA on arimidex   #3 macrocytic anemia stable follow on folic acid   #4 DVT prophylaxis scd Lovenox stopped due to platelets trending down Labs  pending today  #5 hypoalbuminemia encourage p.o. intake  #6 depression on Zoloft   Pressure Injury 12/25/18 Sacrum Mid Deep Tissue Injury - Purple or maroon localized area of discolored intact skin or blood-filled blister due to damage of underlying soft tissue from pressure and/or shear. evolving into stage 2 in patchy areas when asse (Active)  12/25/18 1700  Location: Sacrum  Location Orientation: Mid  Staging: Deep Tissue Injury - Purple or maroon localized area of discolored intact skin or blood-filled blister due to damage of underlying soft tissue from pressure and/or shear.  Wound Description (Comments): evolving into stage 2 in patchy areas when assessed on 8/2  Present on Admission: Yes    Estimated body mass index is 37.4 kg/m as calculated from the following:   Height as of this encounter: '4\' 11"'  (1.499 m).   Weight as of this encounter: 84 kg.  DVT prophylaxis:scd Code Status: Full code Family Communication: None at bedside Disposition Plan:  Status is: Inpatient Remains inpatient appropriate because: Streptococcal bacteremia   Consultants: Infectious disease  Procedures: None Antimicrobials: Penicillin  Subjective: Patient is  C/o diarhea  No other c/o   Objective: Vitals:   12/07/21 1413 12/07/21 1657 12/07/21 2103 12/08/21 0456  BP: 113/61 130/69 131/66 123/73  Pulse: 74 81 82 84  Resp: '18 17 18 20  ' Temp: 98.1 F (36.7 C) 98 F (36.7 C) 98.8 F (37.1 C) 98.5 F (36.9 C)  TempSrc: Oral Oral Oral Oral  SpO2: 95% 100% 100% 98%  Weight:      Height:  Intake/Output Summary (Last 24 hours) at 12/08/2021 1206 Last data filed at 12/08/2021 1103 Gross per 24 hour  Intake 3030.51 ml  Output --  Net 3030.51 ml    Filed Weights   12/06/21 1427  Weight: 84 kg    Examination:  General exam: Appears not in acute distress Chest right breast with decreased erythema and edema and tenderness with erythema under the right breast Respiratory  system: Clear to auscultation. Respiratory effort normal. Cardiovascular system: S1 & S2 heard, RRR. No JVD, murmurs, rubs, gallops or clicks. No pedal edema. Gastrointestinal system: Abdomen is nondistended, soft and nontender. No organomegaly or masses felt. Normal bowel sounds heard. Central nervous system: Alert and oriented. No focal neurological deficits. Extremities: Symmetric 5 x 5 power. Skin: No rashes, lesions or ulcers Psychiatry: Judgement and insight appear normal. Mood & affect appropriate.     Data Reviewed: I have personally reviewed following labs and imaging studies  CBC: Recent Labs  Lab 12/06/21 1420 12/06/21 1715 12/07/21 0358  WBC 2.7* 3.2* 2.3*  NEUTROABS 2.3  --   --   HGB 10.3* 8.0* 8.5*  HCT 30.7* 25.0* 26.3*  MCV 114.6* 117.9* 116.9*  PLT 152 134* 127*    Basic Metabolic Panel: Recent Labs  Lab 12/06/21 1420 12/06/21 1715 12/07/21 0358  NA 138  --  142  K 3.6  --  3.8  CL 107  --  113*  CO2 24  --  22  GLUCOSE 117*  --  115*  BUN 16  --  15  CREATININE 0.99 0.84 0.86  CALCIUM 9.0  --  8.4*    GFR: Estimated Creatinine Clearance: 63.7 mL/min (by C-G formula based on SCr of 0.86 mg/dL). Liver Function Tests: Recent Labs  Lab 12/06/21 1420 12/07/21 0358  AST 20 18  ALT 13 13  ALKPHOS 72 62  BILITOT 1.2 0.7  PROT 6.6 5.5*  ALBUMIN 3.3* 2.8*    No results for input(s): "LIPASE", "AMYLASE" in the last 168 hours. No results for input(s): "AMMONIA" in the last 168 hours. Coagulation Profile: Recent Labs  Lab 12/06/21 1420  INR 1.1    Cardiac Enzymes: No results for input(s): "CKTOTAL", "CKMB", "CKMBINDEX", "TROPONINI" in the last 168 hours. BNP (last 3 results) No results for input(s): "PROBNP" in the last 8760 hours. HbA1C: No results for input(s): "HGBA1C" in the last 72 hours. CBG: No results for input(s): "GLUCAP" in the last 168 hours. Lipid Profile: No results for input(s): "CHOL", "HDL", "LDLCALC", "TRIG",  "CHOLHDL", "LDLDIRECT" in the last 72 hours. Thyroid Function Tests: No results for input(s): "TSH", "T4TOTAL", "FREET4", "T3FREE", "THYROIDAB" in the last 72 hours. Anemia Panel: No results for input(s): "VITAMINB12", "FOLATE", "FERRITIN", "TIBC", "IRON", "RETICCTPCT" in the last 72 hours. Sepsis Labs: Recent Labs  Lab 12/06/21 1512 12/06/21 1607  LATICACIDVEN 1.0 0.8     Recent Results (from the past 240 hour(s))  Blood Culture (routine x 2)     Status: None (Preliminary result)   Collection Time: 12/06/21  2:35 PM   Specimen: BLOOD  Result Value Ref Range Status   Specimen Description   Final    BLOOD BLOOD LEFT ARM Performed at [facility], [place] [address] [place], [place] [address]    Special Requests   Final    BOTTLES DRAWN AEROBIC AND ANAEROBIC Blood Culture adequate volume Performed at [place] [address] [place],  [address]    Culture   Final    NO GROWTH 2  DAYS Performed at [facility], [place] [address] [place], [place] [address]    Report Status PENDING  Incomplete  Blood Culture (routine x 2)     Status: Abnormal (Preliminary result)   Collection Time: 12/06/21  2:35 PM   Specimen: BLOOD  Result Value Ref Range Status   Specimen Description   Final    BLOOD BLOOD LEFT HAND Performed at [facility] [address], [place], [place] [address]    Special Requests   Final    BOTTLES DRAWN AEROBIC AND ANAEROBIC Blood Culture adequate volume Performed at [facility] [address] [place], [place] [address]    Culture  Setup Time   Final    GRAM POSITIVE COCCI AEROBIC BOTTLE ONLY CRITICAL RESULT CALLED TO, READ BACK BY AND VERIFIED WITH: PHARMD ELLEN JACKSON 12/07/21@ 4:42 BY TW    Culture (A)  Final    GROUP B STREP(S.AGALACTIAE)ISOLATED SUSCEPTIBILITIES TO FOLLOW Performed at [facility], [place] [address] [place], [place] [address]    Report  Status PENDING  Incomplete  Blood Culture ID Panel (Reflexed)     Status: Abnormal   Collection Time: 12/06/21  2:35 PM  Result Value Ref Range Status   Enterococcus faecalis NOT DETECTED NOT DETECTED Final   Enterococcus Faecium NOT DETECTED NOT DETECTED Final   Listeria monocytogenes NOT DETECTED NOT DETECTED Final   Staphylococcus species NOT DETECTED NOT DETECTED Final   Staphylococcus aureus (BCID) NOT DETECTED NOT DETECTED Final   Staphylococcus epidermidis NOT DETECTED NOT DETECTED Final   Staphylococcus lugdunensis NOT DETECTED NOT DETECTED Final   Streptococcus species DETECTED (A) NOT DETECTED Final    Comment: CRITICAL RESULT CALLED TO, READ BACK BY AND VERIFIED WITH: PHARMD ELLEN JACKSON 12/07/21@ 4:42 BY TW    Streptococcus agalactiae DETECTED (A) NOT DETECTED Final    Comment: CRITICAL RESULT CALLED TO, READ BACK BY AND VERIFIED WITH: PHARMD ELLEN JACKSON 12/07/21@ 4:42 BY TW    Streptococcus pneumoniae NOT DETECTED NOT DETECTED Final   Streptococcus pyogenes NOT DETECTED NOT DETECTED Final   A.calcoaceticus-baumannii NOT DETECTED NOT DETECTED Final   Bacteroides fragilis NOT DETECTED NOT DETECTED Final   Enterobacterales NOT DETECTED NOT DETECTED Final   Enterobacter cloacae complex NOT DETECTED NOT DETECTED Final   Escherichia coli NOT DETECTED NOT DETECTED Final   Klebsiella aerogenes NOT DETECTED NOT DETECTED Final   Klebsiella oxytoca NOT DETECTED NOT DETECTED Final   Klebsiella pneumoniae NOT DETECTED NOT DETECTED Final   Proteus species NOT DETECTED NOT DETECTED Final   Salmonella species NOT DETECTED NOT DETECTED Final   Serratia marcescens NOT DETECTED NOT DETECTED Final   Haemophilus influenzae NOT DETECTED NOT DETECTED Final   Neisseria meningitidis NOT DETECTED NOT DETECTED Final   Pseudomonas aeruginosa NOT DETECTED NOT DETECTED Final   Stenotrophomonas maltophilia NOT DETECTED NOT DETECTED Final   Candida albicans NOT DETECTED NOT DETECTED Final    Candida auris NOT DETECTED NOT DETECTED Final   Candida glabrata NOT DETECTED NOT DETECTED Final   Candida krusei NOT DETECTED NOT DETECTED Final   Candida parapsilosis NOT DETECTED NOT DETECTED Final   Candida tropicalis NOT DETECTED NOT DETECTED Final   Cryptococcus neoformans/gattii NOT DETECTED NOT DETECTED Final    Comment: Performed at [facility], [address], [place], [place] [address]  Urine Culture     Status: Abnormal   Collection Time: 12/06/21  4:20 PM   Specimen: In/Out Cath Urine  Result Value Ref  Range Status   Specimen Description   Final    IN/OUT CATH URINE Performed at [facility] [address] [place], [place] [address]    Special Requests   Final    NONE Performed at [facility], [place] [address], [place], [place] [address]    Culture MULTIPLE SPECIES PRESENT, SUGGEST RECOLLECTION (A)  Final   Report Status 12/08/2021 FINAL  Final  Culture, blood (Routine X 2) w Reflex to ID Panel     Status: None (Preliminary result)   Collection Time: 12/07/21 10:52 AM   Specimen: BLOOD  Result Value Ref Range Status   Specimen Description   Final    BLOOD RIGHT ANTECUBITAL Performed at [facility] [address] [place], [place] [address]    Special Requests   Final    BOTTLES DRAWN AEROBIC ONLY Blood Culture adequate volume Performed at [facility] [address] [place], [place] [address]    Culture   Final    NO GROWTH < 24 HOURS Performed at [facility] [address] [place], [place] [address]    Report Status PENDING  Incomplete  Culture, blood (Routine X 2) w Reflex to ID Panel     Status: None (Preliminary result)   Collection Time: 12/07/21 10:53 AM   Specimen: BLOOD RIGHT WRIST  Result Value Ref Range Status   Specimen Description   Final    BLOOD RIGHT WRIST Performed at [facility] [address], [place], [place] [address]     Special Requests   Final    BOTTLES DRAWN AEROBIC ONLY Blood Culture adequate volume Performed at [facility] [address] [place], [place] [address]    Culture   Final    NO GROWTH < 24 HOURS Performed at [facility] [address] [place], [place] [address]    Report Status PENDING  Incomplete         Radiology Studies: CT CHEST ABDOMEN PELVIS W CONTRAST  Result Date: 12/07/2021 CLINICAL DATA:  Invasive stage IV breast cancer. Assess treatment response. EXAM: CT CHEST, ABDOMEN, AND PELVIS WITH CONTRAST TECHNIQUE: Multidetector CT imaging of the chest, abdomen and pelvis was performed following the standard protocol during bolus administration of intravenous contrast. RADIATION DOSE REDUCTION: This exam was performed according to the departmental dose-optimization program which includes automated exposure control, adjustment of the mA and/or kV according to patient size and/or use of iterative reconstruction technique. CONTRAST:  145m OMNIPAQUE IOHEXOL 300 MG/ML  SOLN COMPARISON:  CT chest abdomen and pelvis 08/08/2021 FINDINGS: CT CHEST FINDINGS Cardiovascular: No significant vascular findings. Normal heart size. No pericardial effusion. There are atherosclerotic calcifications of the aorta and coronary arteries. Mediastinum/Nodes: Moderate-sized hiatal hernia is unchanged. Esophagus is nondilated. There are no enlarged mediastinal, hilar, or axillary lymph nodes. The visualized thyroid gland is within normal limits. Lungs/Pleura: There is stable elevation of the right hemidiaphragm. There is a new small amount of airspace consolidation and atelectasis in the right middle lobe. Minimal multifocal ground-glass opacities are seen in the left upper lobe and left lower lobe new from prior examination. There are new trace bilateral pleural effusions. Linear areas of scarring bilaterally are otherwise unchanged from prior. No focal pulmonary nodules are seen.  Musculoskeletal: Diffuse osseous sclerotic metastatic disease is again seen. Kyphosis at T11-T12 with compression deformities appears similar to the prior study. No acute fractures are seen. Healed rib fractures are again noted.  Left shoulder arthroplasty hardware is partially visualized. CT ABDOMEN PELVIS FINDINGS Hepatobiliary: No focal liver lesions are seen. Gallstones are again seen. No biliary ductal dilatation. Pancreas: Unremarkable. No pancreatic ductal dilatation or surrounding inflammatory changes. Spleen: Normal in size without focal abnormality. Adrenals/Urinary Tract: Adrenal glands are unremarkable. Kidneys are normal, without renal calculi, focal lesion, or hydronephrosis. Bladder is unremarkable. Stomach/Bowel: Moderate size hiatal hernia is stable. Stomach is otherwise within normal limits. Appendix appears normal. No evidence of bowel wall thickening, distention, or inflammatory changes. Vascular/Lymphatic: Aortic atherosclerosis. No enlarged abdominal or pelvic lymph nodes. Reproductive: Uterus and bilateral adnexa are unremarkable. Other: Small fat containing umbilical hernia. No ascites or free air. Musculoskeletal: Diffuse sclerotic osseous metastatic lesions are similar to the prior study. No acute fractures are seen. IMPRESSION: 1. There are new trace bilateral pleural effusions. 2. New patchy ground-glass opacities in the left lung are nonspecific and may represent mild edema or infectious/inflammatory process. 3. New small amount of atelectasis and airspace disease in the right middle lobe. 4. No evidence for metastatic disease or significant change in the abdomen or pelvis. 5. Stable diffuse sclerotic osseous metastatic disease. Electronically Signed   By: Ronney Asters M.D.   On: 12/07/2021 16:36   DG Chest Port 1 View  Result Date: 12/06/2021 CLINICAL DATA:  Sepsis. EXAM: PORTABLE CHEST 1 VIEW COMPARISON:  CT chest abdomen and pelvis 08/08/2021 FINDINGS: There is stable moderate  elevation of the right hemidiaphragm. The heart size and mediastinal contours are within normal limits. Both lungs are clear. Expansile sclerotic lesion in the medial right clavicle again noted. Additional sclerotic osseous lesions are seen throughout the visualized skeletal structures. There are multiple healed left-sided rib fractures. IMPRESSION: 1. No acute cardiopulmonary process. 2. Stable diffuse osseous sclerotic metastatic disease. Electronically Signed   By: Ronney Asters M.D.   On: 12/06/2021 15:16        Scheduled Meds:  anastrozole  1 mg Oral Daily   calcium-vitamin D  1 tablet Oral BID   folic acid  1 mg Oral Daily   ketoconazole   Topical Daily   loratadine  10 mg Oral Daily   melatonin  1.5 mg Oral QHS   nystatin   Topical TID   sertraline  75 mg Oral Daily   Continuous Infusions:  sodium chloride 75 mL/hr at 12/08/21 1103   penicillin G potassium 12 Million Units in dextrose 5 % 500 mL continuous infusion 12 Million Units (12/08/21 1039)     LOS: 2 days    Time spent: 34 min Georgette Shell, MD  12/08/2021, 12:06 PM

## 2021-12-08 NOTE — Plan of Care (Signed)
Problem: Education: Goal: Knowledge of General Education information will improve Description: Including pain rating scale, medication(s)/side effects and non-pharmacologic comfort measures Outcome: Progressing   Problem: Clinical Measurements: Goal: Ability to maintain clinical measurements within normal limits will improve Outcome: Progressing   Problem: Skin Integrity: Goal: Risk for impaired skin integrity will decrease Outcome: [place], RN 12/08/21 11:10 AM

## 2021-12-09 DIAGNOSIS — L0390 Cellulitis, unspecified: Secondary | ICD-10-CM | POA: Diagnosis not present

## 2021-12-09 DIAGNOSIS — A419 Sepsis, unspecified organism: Secondary | ICD-10-CM | POA: Diagnosis not present

## 2021-12-09 LAB — CULTURE, BLOOD (ROUTINE X 2): Special Requests: ADEQUATE

## 2021-12-09 NOTE — Progress Notes (Signed)
PROGRESS NOTE    Suzanne [name]  RCV:[id number] DOB: Dec 11, 1960 DOA: 12/06/2021 PCP: [name], NP  Brief Narrative:  Suzanne [name] is a 61 y.o. 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 She is admitted with chills rigors and erythema redness and warmth to her right breast.  She also complains of a cough and is on oxygen at home.  She denies shortness of breath nausea vomiting diarrhea abdominal pain or urinary complaints.  Her appetite has been poor for the last few days with increasing generalized weakness.(On Lasix at home) She noticed her right breast was red today She was recently treated for cellulitis with antibiotics. Cxr negative stable diffuse osseous sclerotic metastatic dz Na 138 k 3.6 bun 16 cr 0.99  Wbc 2.7 LA 1.0 HB 10.3 platelets 152 She was tachycardic and tachypneic with leukopenia on admission blood pressure  soft Procalcitonin elevated  Assessment & Plan:   Principal Problem:   Sepsis due to cellulitis [facility]) Active Problems:   Breast skin changes   Macrocytic anemia   Malignant neoplasm metastatic to bone [facility])   Malignant neoplasm of overlapping sites of right breast in female, estrogen receptor positive ([place])   Hypoalbuminemia   Essential hypertension  #1 sepsis secondary to right breast cellulitis.  She met criteria for sepsis on admission with tachycardia tachypnea and leukopenia  Blood cultures positive Strept agalactae  ID following Echo -EF 70 to 75%.  Mitral valve and tricuspid valve and aortic valve normal. Antibiotics narrowed to penicillin  Patient has history of bilateral pleural effusion but currently her chest x-ray is clear and is on RA  DC IV fluids BP better Continue to hold lasix    #2 ER positive stage IV breast CA on arimidex   #3 macrocytic anemia stable follow on folic acid   #4 thrombocytopenia -resolved after stopping  lovenox #5 hypoalbuminemia encourage p.o. intake  #6 depression on Zoloft  #7 intertrigo-nystatin Keep area dry with interdry  #8 severe neutropenia follow-up CBC with differential possibly related to recent chemo   Pressure Injury 12/25/18 Sacrum Mid Deep Tissue Injury - Purple or maroon localized area of discolored intact skin or blood-filled blister due to damage of underlying soft tissue from pressure and/or shear. evolving into stage 2 in patchy areas when asse (Active)  12/25/18 1700  Location: Sacrum  Location Orientation: Mid  Staging: Deep Tissue Injury - Purple or maroon localized area of discolored intact skin or blood-filled blister due to damage of underlying soft tissue from pressure and/or shear.  Wound Description (Comments): evolving into stage 2 in patchy areas when assessed on 8/2  Present on Admission: Yes    Estimated body mass index is 37.4 kg/m as calculated from the following:   Height as of this encounter: '4\' 11"'  (1.499 m).   Weight as of this encounter: 84 kg.  DVT prophylaxis:scd Code Status: Full code Family Communication: None at bedside Disposition Plan:  Status is: Inpatient Remains inpatient appropriate because: Streptococcal bacteremia   Consultants: Infectious disease  Procedures: None Antimicrobials: Penicillin  Subjective: Resting in bed  Had 1 episode of loose bm Objective: Vitals:   12/08/21 0456 12/08/21 1339 12/08/21 2208 12/09/21 0624  BP: 123/73 118/60 112/70 117/63  Pulse: 84 81 80 77  Resp: '20 16 16 14  ' Temp: 98.5 F (36.9 C) 98.5 F (36.9 C) 98.7 F (37.1 C) 98.1 F (36.7 C)  TempSrc: Oral Oral  Oral Oral  SpO2: 98% 99% 100% 100%  Weight:      Height:        Intake/Output Summary (Last 24 hours) at 12/09/2021 1030 Last data filed at 12/09/2021 0600 Gross per 24 hour  Intake 3572.54 ml  Output --  Net 3572.54 ml    Filed Weights   12/06/21 1427  Weight: 84 kg    Examination:  General exam: Appears not in  acute distress Chest right breast with decreased erythema and edema and tenderness with erythema under the right breast Respiratory system: Clear to auscultation. Respiratory effort normal. Cardiovascular system: S1 & S2 heard, RRR. No JVD, murmurs, rubs, gallops or clicks. No pedal edema. Gastrointestinal system: Abdomen is nondistended, soft and nontender. No organomegaly or masses felt. Normal bowel sounds heard. Central nervous system: Alert and oriented. No focal neurological deficits. Extremities: Symmetric 5 x 5 power. Skin: right breast intertrigo maceration increased erythema edema to the right breast Psychiatry: Judgement and insight appear normal. Mood & affect appropriate.     Data Reviewed: I have personally reviewed following labs and imaging studies  CBC: Recent Labs  Lab 12/06/21 1420 12/06/21 1715 12/07/21 0358 12/08/21 1220  WBC 2.7* 3.2* 2.3* 1.9*  NEUTROABS 2.3  --   --   --   HGB 10.3* 8.0* 8.5* 7.9*  HCT 30.7* 25.0* 26.3* 24.2*  MCV 114.6* 117.9* 116.9* 115.8*  PLT 152 134* 127* 540    Basic Metabolic Panel: Recent Labs  Lab 12/06/21 1420 12/06/21 1715 12/07/21 0358 12/08/21 1220  NA 138  --  142 139  K 3.6  --  3.8 3.7  CL 107  --  113* 109  CO2 24  --  22 26  GLUCOSE 117*  --  115* 105*  BUN 16  --  15 7*  CREATININE 0.99 0.84 0.86 0.63  CALCIUM 9.0  --  8.4* 8.1*    GFR: Estimated Creatinine Clearance: 68.5 mL/min (by C-G formula based on SCr of 0.63 mg/dL). Liver Function Tests: Recent Labs  Lab 12/06/21 1420 12/07/21 0358 12/08/21 1220  AST '20 18 18  ' ALT '13 13 13  ' ALKPHOS 72 62 63  BILITOT 1.2 0.7 0.4  PROT 6.6 5.5* 5.7*  ALBUMIN 3.3* 2.8* 2.7*    No results for input(s): "LIPASE", "AMYLASE" in the last 168 hours. No results for input(s): "AMMONIA" in the last 168 hours. Coagulation Profile: Recent Labs  Lab 12/06/21 1420  INR 1.1    Cardiac Enzymes: No results for input(s): "CKTOTAL", "CKMB", "CKMBINDEX", "TROPONINI"  in the last 168 hours. BNP (last 3 results) No results for input(s): "PROBNP" in the last 8760 hours. HbA1C: No results for input(s): "HGBA1C" in the last 72 hours. CBG: No results for input(s): "GLUCAP" in the last 168 hours. Lipid Profile: No results for input(s): "CHOL", "HDL", "LDLCALC", "TRIG", "CHOLHDL", "LDLDIRECT" in the last 72 hours. Thyroid Function Tests: No results for input(s): "TSH", "T4TOTAL", "FREET4", "T3FREE", "THYROIDAB" in the last 72 hours. Anemia Panel: No results for input(s): "VITAMINB12", "FOLATE", "FERRITIN", "TIBC", "IRON", "RETICCTPCT" in the last 72 hours. Sepsis Labs: Recent Labs  Lab 12/06/21 1512 12/06/21 1607  LATICACIDVEN 1.0 0.8     Recent Results (from the past 240 hour(s))  Blood Culture (routine x 2)     Status: None (Preliminary result)   Collection Time: 12/06/21  2:35 PM   Specimen: BLOOD  Result Value Ref Range Status   Specimen Description   Final    BLOOD BLOOD LEFT ARM Performed at  [facility], [place] [address] [place], [place] [address]    Special Requests   Final    BOTTLES DRAWN AEROBIC AND ANAEROBIC Blood Culture adequate volume Performed at [place] [address] [place], [place] [address]    Culture   Final    NO GROWTH 3 DAYS Performed at [facility], [place] [address], [place], [place] [address]    Report Status PENDING  Incomplete  Blood Culture (routine x 2)     Status: Abnormal   Collection Time: 12/06/21  2:35 PM   Specimen: BLOOD  Result Value Ref Range Status   Specimen Description   Final    BLOOD BLOOD LEFT HAND Performed at [place] [address], [place], [place] [address]    Special Requests   Final    BOTTLES DRAWN AEROBIC AND ANAEROBIC Blood Culture adequate volume Performed at [place] [address] [place], [place] [address]    Culture  Setup Time   Final    GRAM POSITIVE COCCI AEROBIC BOTTLE  ONLY CRITICAL RESULT CALLED TO, READ BACK BY AND VERIFIED WITH: PHARMD ELLEN JACKSON 12/07/21@ 4:42 BY TW Performed at [facility], [place] [address], [place], [place] [address]    Culture GROUP B STREP(S.AGALACTIAE)ISOLATED (A)  Final   Report Status 12/09/2021 FINAL  Final   Organism ID, Bacteria GROUP B STREP(S.AGALACTIAE)ISOLATED  Final      Susceptibility   Group b strep(s.agalactiae)isolated - MIC*    CLINDAMYCIN <=0.25 SENSITIVE Sensitive     AMPICILLIN <=0.25 SENSITIVE Sensitive     ERYTHROMYCIN <=0.12 SENSITIVE Sensitive     VANCOMYCIN 0.5 SENSITIVE Sensitive     CEFTRIAXONE <=0.12 SENSITIVE Sensitive     LEVOFLOXACIN 0.5 SENSITIVE Sensitive     * GROUP B STREP(S.AGALACTIAE)ISOLATED  Blood Culture ID Panel (Reflexed)     Status: Abnormal   Collection Time: 12/06/21  2:35 PM  Result Value Ref Range Status   Enterococcus faecalis NOT DETECTED NOT DETECTED Final   Enterococcus Faecium NOT DETECTED NOT DETECTED Final   Listeria monocytogenes NOT DETECTED NOT DETECTED Final   Staphylococcus species NOT DETECTED NOT DETECTED Final   Staphylococcus aureus (BCID) NOT DETECTED NOT DETECTED Final   Staphylococcus epidermidis NOT DETECTED NOT DETECTED Final   Staphylococcus lugdunensis NOT DETECTED NOT DETECTED Final   Streptococcus species DETECTED (A) NOT DETECTED Final    Comment: CRITICAL RESULT CALLED TO, READ BACK BY AND VERIFIED WITH: PHARMD ELLEN JACKSON 12/07/21@ 4:42 BY TW    Streptococcus agalactiae DETECTED (A) NOT DETECTED Final    Comment: CRITICAL RESULT CALLED TO, READ BACK BY AND VERIFIED WITH: PHARMD ELLEN JACKSON 12/07/21@ 4:42 BY TW    Streptococcus pneumoniae NOT DETECTED NOT DETECTED Final   Streptococcus pyogenes NOT DETECTED NOT DETECTED Final   A.calcoaceticus-baumannii NOT DETECTED NOT DETECTED Final   Bacteroides fragilis NOT DETECTED NOT DETECTED Final   Enterobacterales NOT DETECTED NOT DETECTED Final   Enterobacter cloacae complex NOT DETECTED NOT  DETECTED Final   Escherichia coli NOT DETECTED NOT DETECTED Final   Klebsiella aerogenes NOT DETECTED NOT DETECTED Final   Klebsiella oxytoca NOT DETECTED NOT DETECTED Final   Klebsiella pneumoniae NOT DETECTED NOT DETECTED Final   Proteus species NOT DETECTED NOT DETECTED Final   Salmonella species NOT DETECTED NOT DETECTED Final   Serratia marcescens NOT DETECTED NOT DETECTED Final   Haemophilus influenzae NOT DETECTED NOT DETECTED Final   Neisseria meningitidis NOT DETECTED NOT  DETECTED Final   Pseudomonas aeruginosa NOT DETECTED NOT DETECTED Final   Stenotrophomonas maltophilia NOT DETECTED NOT DETECTED Final   Candida albicans NOT DETECTED NOT DETECTED Final   Candida auris NOT DETECTED NOT DETECTED Final   Candida glabrata NOT DETECTED NOT DETECTED Final   Candida krusei NOT DETECTED NOT DETECTED Final   Candida parapsilosis NOT DETECTED NOT DETECTED Final   Candida tropicalis NOT DETECTED NOT DETECTED Final   Cryptococcus neoformans/gattii NOT DETECTED NOT DETECTED Final    Comment: Performed at [facility], [place] [address] [place], [place] [address]  Urine Culture     Status: Abnormal   Collection Time: 12/06/21  4:20 PM   Specimen: In/Out Cath Urine  Result Value Ref Range Status   Specimen Description   Final    IN/OUT CATH URINE Performed at [place] [address], [place], [place] [address]    Special Requests   Final    NONE Performed at [facility], [place] [address] [place], [place] [address]    Culture MULTIPLE SPECIES PRESENT, SUGGEST RECOLLECTION (A)  Final   Report Status 12/08/2021 FINAL  Final  Culture, blood (Routine X 2) w Reflex to ID Panel     Status: None (Preliminary result)   Collection Time: 12/07/21 10:52 AM   Specimen: BLOOD  Result Value Ref Range Status   Specimen Description   Final    BLOOD RIGHT ANTECUBITAL Performed at [place] [address] [place], [place]  [address]    Special Requests   Final    BOTTLES DRAWN AEROBIC ONLY Blood Culture adequate volume Performed at [place] [address], [place], [place] [address]    Culture   Final    NO GROWTH 2 DAYS Performed at [facility] [address] [place], [place] [address]    Report Status PENDING  Incomplete  Culture, blood (Routine X 2) w Reflex to ID Panel     Status: None (Preliminary result)   Collection Time: 12/07/21 10:53 AM   Specimen: BLOOD RIGHT WRIST  Result Value Ref Range Status   Specimen Description   Final    BLOOD RIGHT WRIST Performed at [facility] [address], [place], [place] [address]    Special Requests   Final    BOTTLES DRAWN AEROBIC ONLY Blood Culture adequate volume Performed at [facility] [address], [place], [place] [address]    Culture   Final    NO GROWTH 2 DAYS Performed at [name] [address] [place], [place] [address]    Report Status PENDING  Incomplete         Radiology Studies: ECHOCARDIOGRAM COMPLETE  Result Date: 12/08/2021    ECHOCARDIOGRAM REPORT   Patient Name:   Suzanne [name] Date of Exam: 12/08/2021 Medical Rec #:  [id number]       Height:       59.0 in Accession #:    [id number]      Weight:       185.2 lb Date of Birth:  1961-05-13       BSA:          1.785 m Patient Age:    69 years        BP:           123/73 mmHg Patient Gender: F  HR:           85 bpm. Exam Location:  Inpatient Procedure: 2D Echo, Cardiac Doppler and Color Doppler Indications:    Bacteremia  History:        Patient has prior history of Echocardiogram examinations. Risk                 Factors:Hypertension.  Sonographer:    Jyl Heinz Referring Phys: [id number] [facility]  Sonographer Comments: Patient is morbidly obese. Image acquisition challenging due to patient body habitus. Pt sitting up. IMPRESSIONS  1. Left ventricular ejection fraction, by  estimation, is 70 to 75%. The left ventricle has hyperdynamic function. The left ventricle has no regional wall motion abnormalities. Left ventricular diastolic parameters were normal.  2. Right ventricular systolic function is normal. The right ventricular size is normal.  3. The mitral valve is normal in structure. No evidence of mitral valve regurgitation. No evidence of mitral stenosis.  4. The aortic valve is normal in structure. Aortic valve regurgitation is not visualized. No aortic stenosis is present.  5. The inferior vena cava is normal in size with greater than 50% respiratory variability, suggesting right atrial pressure of 3 mmHg. Conclusion(s)/Recommendation(s): No evidence of valvular vegetations on this transthoracic echocardiogram. Consider a transesophageal echocardiogram to exclude infective endocarditis if clinically indicated. FINDINGS  Left Ventricle: Left ventricular ejection fraction, by estimation, is 70 to 75%. The left ventricle has hyperdynamic function. The left ventricle has no regional wall motion abnormalities. The left ventricular internal cavity size was normal in size. There is no left ventricular hypertrophy. Left ventricular diastolic parameters were normal. Right Ventricle: The right ventricular size is normal. No increase in right ventricular wall thickness. Right ventricular systolic function is normal. Left Atrium: Left atrial size was normal in size. Right Atrium: Right atrial size was normal in size. Pericardium: There is no evidence of pericardial effusion. Mitral Valve: The mitral valve is normal in structure. Mild mitral annular calcification. No evidence of mitral valve regurgitation. No evidence of mitral valve stenosis. Tricuspid Valve: The tricuspid valve is normal in structure. Tricuspid valve regurgitation is not demonstrated. No evidence of tricuspid stenosis. Aortic Valve: The aortic valve is normal in structure. Aortic valve regurgitation is not visualized. No  aortic stenosis is present. Aortic valve peak gradient measures 8.4 mmHg. Pulmonic Valve: The pulmonic valve was normal in structure. Pulmonic valve regurgitation is not visualized. No evidence of pulmonic stenosis. Aorta: The aortic root is normal in size and structure. Venous: The inferior vena cava is normal in size with greater than 50% respiratory variability, suggesting right atrial pressure of 3 mmHg. IAS/Shunts: No atrial level shunt detected by color flow Doppler.  LEFT VENTRICLE PLAX 2D LVIDd:         3.60 cm     Diastology LVIDs:         2.40 cm     LV e' medial:    7.29 cm/s LV PW:         1.10 cm     LV E/e' medial:  11.9 LV IVS:        0.90 cm     LV e' lateral:   5.66 cm/s LVOT diam:     1.80 cm     LV E/e' lateral: 15.4 LV SV:         53 LV SV Index:   30 LVOT Area:     2.54 cm  LV Volumes (MOD) LV vol d, MOD A2C: 74.2 ml LV  vol d, MOD A4C: 67.9 ml LV vol s, MOD A2C: 31.5 ml LV vol s, MOD A4C: 28.1 ml LV SV MOD A2C:     42.7 ml LV SV MOD A4C:     67.9 ml LV SV MOD BP:      44.1 ml RIGHT VENTRICLE RV Basal diam:  3.10 cm RV Mid diam:    2.60 cm RV S prime:     15.50 cm/s TAPSE (M-mode): 2.5 cm LEFT ATRIUM             Index        RIGHT ATRIUM           Index LA diam:        3.90 cm 2.18 cm/m   RA Area:     11.80 cm LA Vol (A2C):   33.3 ml 18.66 ml/m  RA Volume:   26.10 ml  14.62 ml/m LA Vol (A4C):   34.5 ml 19.33 ml/m LA Biplane Vol: 35.7 ml 20.00 ml/m  AORTIC VALVE AV Area (Vmax): 1.88 cm AV Vmax:        145.00 cm/s AV Peak Grad:   8.4 mmHg LVOT Vmax:      107.00 cm/s LVOT Vmean:     71.200 cm/s LVOT VTI:       0.210 m  AORTA Ao Root diam: 2.50 cm Ao Asc diam:  2.60 cm MITRAL VALVE MV Area (PHT): 4.57 cm    SHUNTS MV Decel Time: 166 msec    Systemic VTI:  0.21 m MV E velocity: 87.00 cm/s  Systemic Diam: 1.80 cm MV A velocity: 81.40 cm/s MV E/A ratio:  1.07 Candee Furbish MD Electronically signed by Candee Furbish MD Signature Date/Time: 12/08/2021/1:00:53 PM    Final    CT CHEST ABDOMEN PELVIS W  CONTRAST  Result Date: 12/07/2021 CLINICAL DATA:  Invasive stage IV breast cancer. Assess treatment response. EXAM: CT CHEST, ABDOMEN, AND PELVIS WITH CONTRAST TECHNIQUE: Multidetector CT imaging of the chest, abdomen and pelvis was performed following the standard protocol during bolus administration of intravenous contrast. RADIATION DOSE REDUCTION: This exam was performed according to the departmental dose-optimization program which includes automated exposure control, adjustment of the mA and/or kV according to patient size and/or use of iterative reconstruction technique. CONTRAST:  145m OMNIPAQUE IOHEXOL 300 MG/ML  SOLN COMPARISON:  CT chest abdomen and pelvis 08/08/2021 FINDINGS: CT CHEST FINDINGS Cardiovascular: No significant vascular findings. Normal heart size. No pericardial effusion. There are atherosclerotic calcifications of the aorta and coronary arteries. Mediastinum/Nodes: Moderate-sized hiatal hernia is unchanged. Esophagus is nondilated. There are no enlarged mediastinal, hilar, or axillary lymph nodes. The visualized thyroid gland is within normal limits. Lungs/Pleura: There is stable elevation of the right hemidiaphragm. There is a new small amount of airspace consolidation and atelectasis in the right middle lobe. Minimal multifocal ground-glass opacities are seen in the left upper lobe and left lower lobe new from prior examination. There are new trace bilateral pleural effusions. Linear areas of scarring bilaterally are otherwise unchanged from prior. No focal pulmonary nodules are seen. Musculoskeletal: Diffuse osseous sclerotic metastatic disease is again seen. Kyphosis at T11-T12 with compression deformities appears similar to the prior study. No acute fractures are seen. Healed rib fractures are again noted. Left shoulder arthroplasty hardware is partially visualized. CT ABDOMEN PELVIS FINDINGS Hepatobiliary: No focal liver lesions are seen. Gallstones are again seen. No biliary  ductal dilatation. Pancreas: Unremarkable. No pancreatic ductal dilatation or surrounding inflammatory changes. Spleen: Normal in size without focal  abnormality. Adrenals/Urinary Tract: Adrenal glands are unremarkable. Kidneys are normal, without renal calculi, focal lesion, or hydronephrosis. Bladder is unremarkable. Stomach/Bowel: Moderate size hiatal hernia is stable. Stomach is otherwise within normal limits. Appendix appears normal. No evidence of bowel wall thickening, distention, or inflammatory changes. Vascular/Lymphatic: Aortic atherosclerosis. No enlarged abdominal or pelvic lymph nodes. Reproductive: Uterus and bilateral adnexa are unremarkable. Other: Small fat containing umbilical hernia. No ascites or free air. Musculoskeletal: Diffuse sclerotic osseous metastatic lesions are similar to the prior study. No acute fractures are seen. IMPRESSION: 1. There are new trace bilateral pleural effusions. 2. New patchy ground-glass opacities in the left lung are nonspecific and may represent mild edema or infectious/inflammatory process. 3. New small amount of atelectasis and airspace disease in the right middle lobe. 4. No evidence for metastatic disease or significant change in the abdomen or pelvis. 5. Stable diffuse sclerotic osseous metastatic disease. Electronically Signed   By: Ronney Asters M.D.   On: 12/07/2021 16:36        Scheduled Meds:  anastrozole  1 mg Oral Daily   calcium-vitamin D  1 tablet Oral BID   folic acid  1 mg Oral Daily   ketoconazole   Topical Daily   loratadine  10 mg Oral Daily   melatonin  1.5 mg Oral QHS   nystatin   Topical TID   sertraline  75 mg Oral Daily   Continuous Infusions:  sodium chloride 75 mL/hr at 12/09/21 0625   penicillin G potassium 12 Million Units in dextrose 5 % 500 mL continuous infusion 12 Million Units (12/09/21 0939)     LOS: 3 days    Time spent: 34 min Georgette Shell, MD  12/09/2021, 10:30 AM

## 2021-12-09 NOTE — Plan of Care (Signed)
  Problem: Education: Goal: Knowledge of General Education information will improve Description: Including pain rating scale, medication(s)/side effects and non-pharmacologic comfort measures Outcome: Progressing   Problem: Clinical Measurements: Goal: Ability to maintain clinical measurements within normal limits will improve Outcome: Progressing Goal: Cardiovascular complication will be avoided Outcome: Adequate for Discharge   Problem: Activity: Goal: Risk for activity intolerance will decrease Outcome: Progressing   Problem: Nutrition: Goal: Adequate nutrition will be maintained Outcome: Progressing

## 2021-12-10 DIAGNOSIS — A419 Sepsis, unspecified organism: Secondary | ICD-10-CM | POA: Diagnosis not present

## 2021-12-10 DIAGNOSIS — L0390 Cellulitis, unspecified: Secondary | ICD-10-CM | POA: Diagnosis not present

## 2021-12-10 LAB — CBC WITH DIFFERENTIAL/PLATELET
Abs Immature Granulocytes: 0.01 10*3/uL (ref 0.00–0.07)
Basophils Absolute: 0 10*3/uL (ref 0.0–0.1)
Basophils Relative: 2 %
Eosinophils Absolute: 0 10*3/uL (ref 0.0–0.5)
Eosinophils Relative: 2 %
HCT: 24.2 % — ABNORMAL LOW (ref 36.0–46.0)
Hemoglobin: 7.9 g/dL — ABNORMAL LOW (ref 12.0–15.0)
Immature Granulocytes: 1 %
Lymphocytes Relative: 28 %
Lymphs Abs: 0.6 10*3/uL — ABNORMAL LOW (ref 0.7–4.0)
MCH: 37.6 pg — ABNORMAL HIGH (ref 26.0–34.0)
MCHC: 32.6 g/dL (ref 30.0–36.0)
MCV: 115.2 fL — ABNORMAL HIGH (ref 80.0–100.0)
Monocytes Absolute: 0.3 10*3/uL (ref 0.1–1.0)
Monocytes Relative: 14 %
Neutro Abs: 1.1 10*3/uL — ABNORMAL LOW (ref 1.7–7.7)
Neutrophils Relative %: 53 %
Platelets: 206 10*3/uL (ref 150–400)
RBC: 2.1 MIL/uL — ABNORMAL LOW (ref 3.87–5.11)
RDW: 14.4 % (ref 11.5–15.5)
WBC: 2 10*3/uL — ABNORMAL LOW (ref 4.0–10.5)
nRBC: 0 % (ref 0.0–0.2)

## 2021-12-10 MED ORDER — KETOCONAZOLE 2 % EX CREA
TOPICAL_CREAM | Freq: Every day | CUTANEOUS | 0 refills | Status: DC
  Filled 2021-12-10: qty 15, 7d supply, fill #0

## 2021-12-10 MED ORDER — AMOXICILLIN 500 MG PO CAPS
1000.0000 mg | ORAL_CAPSULE | Freq: Three times a day (TID) | ORAL | 0 refills | Status: DC
  Filled 2021-12-10: qty 60, 10d supply, fill #0

## 2021-12-10 MED ORDER — SODIUM CHLORIDE 0.9 % IV SOLN: INTRAVENOUS | Status: DC | PRN

## 2021-12-10 MED ORDER — AMOXICILLIN 500 MG PO CAPS
1000.0000 mg | ORAL_CAPSULE | Freq: Once | ORAL | Status: AC
  Administered 2021-12-10: 1000 mg via ORAL
  Filled 2021-12-10: qty 2

## 2021-12-10 MED ORDER — AMOXICILLIN 500 MG PO CAPS: 1000.0000 mg | ORAL_CAPSULE | Freq: Three times a day (TID) | ORAL | 0 refills | Status: AC

## 2021-12-10 MED ORDER — AMOXICILLIN 500 MG PO CAPS
1000.0000 mg | ORAL_CAPSULE | Freq: Three times a day (TID) | ORAL | 0 refills | Status: DC
  Filled 2021-12-10: qty 30, 5d supply, fill #0

## 2021-12-10 NOTE — Plan of Care (Signed)
  Problem: Education: Goal: Knowledge of General Education information will improve Description Including pain rating scale, medication(s)/side effects and non-pharmacologic comfort measures Outcome: Progressing   Problem: Health Behavior/Discharge Planning: Goal: Ability to manage health-related needs will improve Outcome: Progressing   

## 2021-12-10 NOTE — Discharge Summary (Signed)
Physician Discharge Summary  Suzanne [name] KKX:[id number] DOB: Mar 16, 1961 DOA: 12/06/2021  PCP: [name], NP  Admit date: 12/06/2021 Discharge date: 12/10/2021  Admitted From: home Disposition:  home  Recommendations for Outpatient Follow-up:  Follow up with PCP in 1-2 weeks Please obtain BMP/CBC in one week Please follow up with oncology  Home Health: None Equipment/Devices: None Discharge Condition: Stable CODE  STATUS: Full code Diet recommendation: Cardiac  brief/Interim Summary:  Suzanne [name] is a 61 y.o. 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 She is admitted with chills rigors and erythema redness and warmth to her right breast.  She also complains of a cough and is on oxygen at home.  She denies shortness of breath nausea vomiting diarrhea abdominal pain or urinary complaints.  Her appetite has been poor for the last few days with increasing generalized weakness.(On Lasix at home) She noticed her right breast was red today She was recently treated for cellulitis with antibiotics. Cxr stable diffuse osseous sclerotic metastatic dz Na 138 k 3.6 bun 16 cr 0.99  Wbc 2.7 LA 1.0 HB 10.3 platelets 152 She was tachycardic and tachypneic with leukopenia on admission blood pressure  soft Procalcitonin elevated  Discharge Diagnoses:  Principal Problem:   Sepsis due to cellulitis [facility]) Active Problems:   Breast skin changes   Macrocytic anemia   Malignant neoplasm metastatic to bone [facility])   Malignant neoplasm of overlapping sites of right breast in female, estrogen receptor positive (HCC)   Hypoalbuminemia   Essential hypertension   Pressure Injury 12/25/18 Sacrum Mid Deep Tissue Injury - Purple or maroon localized area of discolored intact skin or blood-filled blister due to damage of underlying soft tissue from pressure and/or shear. evolving into stage 2 in  patchy areas when asse (Active)  12/25/18 1700  Location: Sacrum  Location Orientation: Mid  Staging: Deep Tissue Injury - Purple or maroon localized area of discolored intact skin or blood-filled blister due to damage of underlying soft tissue from pressure and/or shear.  Wound Description (Comments): evolving into stage 2 in patchy areas when assessed on 8/2  Present on Admission: Yes     1 sepsis secondary to right breast cellulitis.  She met criteria for sepsis on admission with tachycardia tachypnea and leukopenia  Blood cultures positive Strept agalactae she was treated with IV penicillin.  She was seen in consultation by infectious disease. Blood culture shows group B strep with pan sensitivity.  She was discharged on ampicillin 1 g 3 times daily for 10 days per ID. Echo -EF 70 to 75%.  Mitral valve and tricuspid valve and aortic valve normal. Antibiotics narrowed to penicillin    #2 ER positive stage IV breast CA on arimidex   #3 macrocytic anemia stable follow on folic acid   #4 thrombocytopenia -resolved after stopping lovenox #5 hypoalbuminemia encouraged p.o. intake   #6 depression on Zoloft   #7 intertrigo-nystatin Keep area dry with interdry   #8 severe neutropenia follow-up CBC with differential possibly related to recent chemo WBC on the day of discharge is 2.0.  I have asked her to follow-up with oncology to repeat these labs.   Estimated body mass index is 37.4 kg/m as calculated from the following:   Height as of this encounter: _0  (1.499 m).   Weight as of this encounter: 84 kg.  Discharge Instructions  Discharge Instructions     Diet - low  sodium heart healthy   Complete by: As directed    Increase activity slowly   Complete by: As directed       Allergies as of 12/10/2021       Reactions   Ferumoxytol Other (See Comments)   Pt had hypersensitivity to Ferumoxytol. She had Facial Flushing. See note from 07/14/2021        Medication List      TAKE these medications    acetaminophen 500 MG tablet Commonly known as: TYLENOL Take 1,000 mg by mouth every 6 (six) hours as needed for mild pain.   Advil Dual Action 125-250 MG Tabs Generic drug: Ibuprofen-Acetaminophen Take 2 tablets by mouth daily as needed (pain).   amoxicillin 500 MG capsule Commonly known as: AMOXIL Take 2 capsules (1,000 mg total) by mouth 3 (three) times daily for 10 days.   anastrozole 1 MG tablet Commonly known as: ARIMIDEX Take 1 tablet (1 mg total) by mouth daily.   Blood Pressure Monitor Misc Medium sized cuff. Use to check blood pressure twice per day and more often if not feeling well. I10.0   cetirizine 10 MG tablet Commonly known as: Allergy Relief Cetirizine Take 1 tablet (10 mg total) by mouth daily.   famotidine 20 MG tablet Commonly known as: PEPCID Take 1 tablet (20 mg total) by mouth 2 (two) times daily as needed for heartburn or indigestion.   folic acid 1 MG tablet Commonly known as: FOLVITE Take 1 tablet (1 mg total) by mouth daily.   furosemide 20 MG tablet Commonly known as: LASIX Take 1 tablet (20 mg total) by mouth daily.   Ibrance 75 MG tablet Generic drug: palbociclib TAKE 1 TABLET (75 MG TOTAL) BY MOUTH DAILY. TAKE FOR 21 DAYS ON, 7 DAYS OFF, REPEAT EVERY 28 DAYS   ketoconazole 2 % cream Commonly known as: NIZORAL Apply topically daily. Start taking on: December 11, 2021   Melatonin TR 1 MG Tbcr Generic drug: Melatonin ER Take 1 mg by mouth at bedtime.   nystatin powder Commonly known as: MYCOSTATIN/NYSTOP Apply 1 application topically as needed. What changed:  when to take this reasons to take this   [place] Calcium Plus D 500-5 MG-MCG Tabs Generic drug: Calcium Carb-Cholecalciferol TAKE ONE TABLET BY MOUTH EVERY MORNING and TAKE ONE TABLET BY MOUTH AT NOON and TAKE ONE TABLET BY MOUTH EVERY EVENING What changed: See the new instructions.   polyethylene glycol 17 g packet Commonly known as:  MIRALAX / GLYCOLAX Take 17 g by mouth as needed. What changed:  when to take this reasons to take this   sertraline 50 MG tablet Commonly known as: ZOLOFT TAKE 1 AND 1/2 TABLETS BY MOUTH EVERYDAY AT BEDTIME What changed: See the new instructions.        Follow-up Information     [name], NP Follow up.   Specialty: Nurse Practitioner Contact information: [place] [address] [phone]         Benay Pike, MD Follow up.   Specialty: Hematology and Oncology Contact information: [place] [address] [phone]                Allergies  Allergen Reactions   Ferumoxytol Other (See Comments)    Pt had hypersensitivity to Ferumoxytol. She had Facial Flushing. See note from 07/14/2021    Consultations: Infectious disease   Procedures/Studies: ECHOCARDIOGRAM COMPLETE  Result Date: 12/08/2021    ECHOCARDIOGRAM REPORT   Patient Name:  [name] Date of Exam: 12/08/2021 Medical Rec #:  [id number]       Height:       59.0 in Accession #:    [id number]      Weight:       185.2 lb Date of Birth:  26-Apr-1961       BSA:          1.785 m Patient Age:    60 years        BP:           123/73 mmHg Patient Gender: F               HR:           85 bpm. Exam Location:  Inpatient Procedure: 2D Echo, Cardiac Doppler and Color Doppler Indications:    Bacteremia  History:        Patient has prior history of Echocardiogram examinations. Risk                 Factors:Hypertension.  Sonographer:    Jyl Heinz Referring Phys: [id number] [facility]  Sonographer Comments: Patient is morbidly obese. Image acquisition challenging due to patient body habitus. Pt sitting up. IMPRESSIONS  1. Left ventricular ejection fraction, by estimation, is 70 to 75%. The left ventricle has hyperdynamic function. The left ventricle has no regional wall motion abnormalities. Left ventricular diastolic parameters were normal.  2. Right ventricular systolic  function is normal. The right ventricular size is normal.  3. The mitral valve is normal in structure. No evidence of mitral valve regurgitation. No evidence of mitral stenosis.  4. The aortic valve is normal in structure. Aortic valve regurgitation is not visualized. No aortic stenosis is present.  5. The inferior vena cava is normal in size with greater than 50% respiratory variability, suggesting right atrial pressure of 3 mmHg. Conclusion(s)/Recommendation(s): No evidence of valvular vegetations on this transthoracic echocardiogram. Consider a transesophageal echocardiogram to exclude infective endocarditis if clinically indicated. FINDINGS  Left Ventricle: Left ventricular ejection fraction, by estimation, is 70 to 75%. The left ventricle has hyperdynamic function. The left ventricle has no regional wall motion abnormalities. The left ventricular internal cavity size was normal in size. There is no left ventricular hypertrophy. Left ventricular diastolic parameters were normal. Right Ventricle: The right ventricular size is normal. No increase in right ventricular wall thickness. Right ventricular systolic function is normal. Left Atrium: Left atrial size was normal in size. Right Atrium: Right atrial size was normal in size. Pericardium: There is no evidence of pericardial effusion. Mitral Valve: The mitral valve is normal in structure. Mild mitral annular calcification. No evidence of mitral valve regurgitation. No evidence of mitral valve stenosis. Tricuspid Valve: The tricuspid valve is normal in structure. Tricuspid valve regurgitation is not demonstrated. No evidence of tricuspid stenosis. Aortic Valve: The aortic valve is normal in structure. Aortic valve regurgitation is not visualized. No aortic stenosis is present. Aortic valve peak gradient measures 8.4 mmHg. Pulmonic Valve: The pulmonic valve was normal in structure. Pulmonic valve regurgitation is not visualized. No evidence of pulmonic stenosis.  Aorta: The aortic root is normal in size and structure. Venous: The inferior vena cava is normal in size with greater than 50% respiratory variability, suggesting right atrial pressure of 3 mmHg. IAS/Shunts: No atrial level shunt detected by color flow Doppler.  LEFT VENTRICLE PLAX 2D LVIDd:         3.60 cm     Diastology LVIDs:  2.40 cm     LV e' medial:    7.29 cm/s LV PW:         1.10 cm     LV E/e' medial:  11.9 LV IVS:        0.90 cm     LV e' lateral:   5.66 cm/s LVOT diam:     1.80 cm     LV E/e' lateral: 15.4 LV SV:         53 LV SV Index:   30 LVOT Area:     2.54 cm  LV Volumes (MOD) LV vol d, MOD A2C: 74.2 ml LV vol d, MOD A4C: 67.9 ml LV vol s, MOD A2C: 31.5 ml LV vol s, MOD A4C: 28.1 ml LV SV MOD A2C:     42.7 ml LV SV MOD A4C:     67.9 ml LV SV MOD BP:      44.1 ml RIGHT VENTRICLE RV Basal diam:  3.10 cm RV Mid diam:    2.60 cm RV S prime:     15.50 cm/s TAPSE (M-mode): 2.5 cm LEFT ATRIUM             Index        RIGHT ATRIUM           Index LA diam:        3.90 cm 2.18 cm/m   RA Area:     11.80 cm LA Vol (A2C):   33.3 ml 18.66 ml/m  RA Volume:   26.10 ml  14.62 ml/m LA Vol (A4C):   34.5 ml 19.33 ml/m LA Biplane Vol: 35.7 ml 20.00 ml/m  AORTIC VALVE AV Area (Vmax): 1.88 cm AV Vmax:        145.00 cm/s AV Peak Grad:   8.4 mmHg LVOT Vmax:      107.00 cm/s LVOT Vmean:     71.200 cm/s LVOT VTI:       0.210 m  AORTA Ao Root diam: 2.50 cm Ao Asc diam:  2.60 cm MITRAL VALVE MV Area (PHT): 4.57 cm    SHUNTS MV Decel Time: 166 msec    Systemic VTI:  0.21 m MV E velocity: 87.00 cm/s  Systemic Diam: 1.80 cm MV A velocity: 81.40 cm/s MV E/A ratio:  1.07 Candee Furbish MD Electronically signed by Candee Furbish MD Signature Date/Time: 12/08/2021/1:00:53 PM    Final    CT CHEST ABDOMEN PELVIS W CONTRAST  Result Date: 12/07/2021 CLINICAL DATA:  Invasive stage IV breast cancer. Assess treatment response. EXAM: CT CHEST, ABDOMEN, AND PELVIS WITH CONTRAST TECHNIQUE: Multidetector CT imaging of the chest,  abdomen and pelvis was performed following the standard protocol during bolus administration of intravenous contrast. RADIATION DOSE REDUCTION: This exam was performed according to the departmental dose-optimization program which includes automated exposure control, adjustment of the mA and/or kV according to patient size and/or use of iterative reconstruction technique. CONTRAST:  17m OMNIPAQUE IOHEXOL 300 MG/ML  SOLN COMPARISON:  CT chest abdomen and pelvis 08/08/2021 FINDINGS: CT CHEST FINDINGS Cardiovascular: No significant vascular findings. Normal heart size. No pericardial effusion. There are atherosclerotic calcifications of the aorta and coronary arteries. Mediastinum/Nodes: Moderate-sized hiatal hernia is unchanged. Esophagus is nondilated. There are no enlarged mediastinal, hilar, or axillary lymph nodes. The visualized thyroid gland is within normal limits. Lungs/Pleura: There is stable elevation of the right hemidiaphragm. There is a new small amount of airspace consolidation and atelectasis in the right middle lobe. Minimal multifocal ground-glass opacities are seen in  the left upper lobe and left lower lobe new from prior examination. There are new trace bilateral pleural effusions. Linear areas of scarring bilaterally are otherwise unchanged from prior. No focal pulmonary nodules are seen. Musculoskeletal: Diffuse osseous sclerotic metastatic disease is again seen. Kyphosis at T11-T12 with compression deformities appears similar to the prior study. No acute fractures are seen. Healed rib fractures are again noted. Left shoulder arthroplasty hardware is partially visualized. CT ABDOMEN PELVIS FINDINGS Hepatobiliary: No focal liver lesions are seen. Gallstones are again seen. No biliary ductal dilatation. Pancreas: Unremarkable. No pancreatic ductal dilatation or surrounding inflammatory changes. Spleen: Normal in size without focal abnormality. Adrenals/Urinary Tract: Adrenal glands are  unremarkable. Kidneys are normal, without renal calculi, focal lesion, or hydronephrosis. Bladder is unremarkable. Stomach/Bowel: Moderate size hiatal hernia is stable. Stomach is otherwise within normal limits. Appendix appears normal. No evidence of bowel wall thickening, distention, or inflammatory changes. Vascular/Lymphatic: Aortic atherosclerosis. No enlarged abdominal or pelvic lymph nodes. Reproductive: Uterus and bilateral adnexa are unremarkable. Other: Small fat containing umbilical hernia. No ascites or free air. Musculoskeletal: Diffuse sclerotic osseous metastatic lesions are similar to the prior study. No acute fractures are seen. IMPRESSION: 1. There are new trace bilateral pleural effusions. 2. New patchy ground-glass opacities in the left lung are nonspecific and may represent mild edema or infectious/inflammatory process. 3. New small amount of atelectasis and airspace disease in the right middle lobe. 4. No evidence for metastatic disease or significant change in the abdomen or pelvis. 5. Stable diffuse sclerotic osseous metastatic disease. Electronically Signed   By: Ronney Asters M.D.   On: 12/07/2021 16:36   DG Chest Port 1 View  Result Date: 12/06/2021 CLINICAL DATA:  Sepsis. EXAM: PORTABLE CHEST 1 VIEW COMPARISON:  CT chest abdomen and pelvis 08/08/2021 FINDINGS: There is stable moderate elevation of the right hemidiaphragm. The heart size and mediastinal contours are within normal limits. Both lungs are clear. Expansile sclerotic lesion in the medial right clavicle again noted. Additional sclerotic osseous lesions are seen throughout the visualized skeletal structures. There are multiple healed left-sided rib fractures. IMPRESSION: 1. No acute cardiopulmonary process. 2. Stable diffuse osseous sclerotic metastatic disease. Electronically Signed   By: Ronney Asters M.D.   On: 12/06/2021 15:16   (Echo, Carotid, EGD, Colonoscopy, ERCP)    Subjective:   Discharge Exam: Vitals:    12/09/21 2204 12/10/21 0626  BP: (!) 148/88 134/71  Pulse: 90 75  Resp: 18 18  Temp: 98.4 F (36.9 C) 98 F (36.7 C)  SpO2: 98% 100%   Vitals:   12/09/21 0624 12/09/21 1314 12/09/21 2204 12/10/21 0626  BP: 117/63 (!) 141/84 (!) 148/88 134/71  Pulse: 77 78 90 75  Resp: _0 Temp: 98.1 F (36.7 C) 98.3 F (36.8 C) 98.4 F (36.9 C) 98 F (36.7 C)  TempSrc: Oral Oral Oral Oral  SpO2: 100% 100% 98% 100%  Weight:      Height:        General: Pt is alert, awake, not in acute distress Cardiovascular: RRR, S1/S2 +, no rubs, no gallops Respiratory: CTA bilaterally, no wheezing, no rhonchi Abdominal: Soft, NT, ND, bowel sounds + Extremities: no edema, no cyanosis    The results of significant diagnostics from this hospitalization (including imaging, microbiology, ancillary and laboratory) are listed below for reference.     Microbiology: Recent Results (from the past 240 hour(s))  Blood Culture (routine x 2)     Status: None (Preliminary result)   Collection  Time: 12/06/21  2:35 PM   Specimen: BLOOD  Result Value Ref Range Status   Specimen Description   Final    BLOOD BLOOD LEFT ARM Performed at [facility] [address] [place], [place] [address]    Special Requests   Final    BOTTLES DRAWN AEROBIC AND ANAEROBIC Blood Culture adequate volume Performed at [facility] [address], [place], [place] [address]    Culture   Final    NO GROWTH 4 DAYS Performed at [facility], [place] [address], [place], [place] [address]    Report Status PENDING  Incomplete  Blood Culture (routine x 2)     Status: Abnormal   Collection Time: 12/06/21  2:35 PM   Specimen: BLOOD  Result Value Ref Range Status   Specimen Description   Final    BLOOD BLOOD LEFT HAND Performed at [facility] [address], [place], [place] [address]    Special Requests   Final    BOTTLES DRAWN AEROBIC AND ANAEROBIC Blood  Culture adequate volume Performed at [facility] [address], [place], [place] [address]    Culture  Setup Time   Final    GRAM POSITIVE COCCI AEROBIC BOTTLE ONLY CRITICAL RESULT CALLED TO, READ BACK BY AND VERIFIED WITH: PHARMD ELLEN JACKSON 12/07/21@ 4:42 BY TW Performed at [facility], [place] [address] [place], [place] [address]    Culture GROUP B STREP(S.AGALACTIAE)ISOLATED (A)  Final   Report Status 12/09/2021 FINAL  Final   Organism ID, Bacteria GROUP B STREP(S.AGALACTIAE)ISOLATED  Final      Susceptibility   Group b strep(s.agalactiae)isolated - MIC*    CLINDAMYCIN <=0.25 SENSITIVE Sensitive     AMPICILLIN <=0.25 SENSITIVE Sensitive     ERYTHROMYCIN <=0.12 SENSITIVE Sensitive     VANCOMYCIN 0.5 SENSITIVE Sensitive     CEFTRIAXONE <=0.12 SENSITIVE Sensitive     LEVOFLOXACIN 0.5 SENSITIVE Sensitive     * GROUP B STREP(S.AGALACTIAE)ISOLATED  Blood Culture ID Panel (Reflexed)     Status: Abnormal   Collection Time: 12/06/21  2:35 PM  Result Value Ref Range Status   Enterococcus faecalis NOT DETECTED NOT DETECTED Final   Enterococcus Faecium NOT DETECTED NOT DETECTED Final   Listeria monocytogenes NOT DETECTED NOT DETECTED Final   Staphylococcus species NOT DETECTED NOT DETECTED Final   Staphylococcus aureus (BCID) NOT DETECTED NOT DETECTED Final   Staphylococcus epidermidis NOT DETECTED NOT DETECTED Final   Staphylococcus lugdunensis NOT DETECTED NOT DETECTED Final   Streptococcus species DETECTED (A) NOT DETECTED Final    Comment: CRITICAL RESULT CALLED TO, READ BACK BY AND VERIFIED WITH: PHARMD ELLEN JACKSON 12/07/21@ 4:42 BY TW    Streptococcus agalactiae DETECTED (A) NOT DETECTED Final    Comment: CRITICAL RESULT CALLED TO, READ BACK BY AND VERIFIED WITH: PHARMD ELLEN JACKSON 12/07/21@ 4:42 BY TW    Streptococcus pneumoniae NOT DETECTED NOT DETECTED Final   Streptococcus pyogenes NOT DETECTED NOT DETECTED Final   A.calcoaceticus-baumannii  NOT DETECTED NOT DETECTED Final   Bacteroides fragilis NOT DETECTED NOT DETECTED Final   Enterobacterales NOT DETECTED NOT DETECTED Final   Enterobacter cloacae complex NOT DETECTED NOT DETECTED Final   Escherichia coli NOT DETECTED NOT DETECTED Final   Klebsiella aerogenes NOT DETECTED NOT DETECTED Final   Klebsiella oxytoca NOT DETECTED NOT DETECTED Final   Klebsiella pneumoniae NOT DETECTED NOT DETECTED Final   Proteus species NOT DETECTED NOT DETECTED Final   Salmonella  species NOT DETECTED NOT DETECTED Final   Serratia marcescens NOT DETECTED NOT DETECTED Final   Haemophilus influenzae NOT DETECTED NOT DETECTED Final   Neisseria meningitidis NOT DETECTED NOT DETECTED Final   Pseudomonas aeruginosa NOT DETECTED NOT DETECTED Final   Stenotrophomonas maltophilia NOT DETECTED NOT DETECTED Final   Candida albicans NOT DETECTED NOT DETECTED Final   Candida auris NOT DETECTED NOT DETECTED Final   Candida glabrata NOT DETECTED NOT DETECTED Final   Candida krusei NOT DETECTED NOT DETECTED Final   Candida parapsilosis NOT DETECTED NOT DETECTED Final   Candida tropicalis NOT DETECTED NOT DETECTED Final   Cryptococcus neoformans/gattii NOT DETECTED NOT DETECTED Final    Comment: Performed at [facility], [place] [address] [place], [place] [address]  Urine Culture     Status: Abnormal   Collection Time: 12/06/21  4:20 PM   Specimen: In/Out Cath Urine  Result Value Ref Range Status   Specimen Description   Final    IN/OUT CATH URINE Performed at [facility] [address], [place], [place] [address]    Special Requests   Final    NONE Performed at [facility], [place] [address] [place], [place] [address]    Culture MULTIPLE SPECIES PRESENT, SUGGEST RECOLLECTION (A)  Final   Report Status 12/08/2021 FINAL  Final  Culture, blood (Routine X 2) w Reflex to ID Panel     Status: None (Preliminary result)   Collection Time: 12/07/21 10:52 AM    Specimen: BLOOD  Result Value Ref Range Status   Specimen Description   Final    BLOOD RIGHT ANTECUBITAL Performed at [facility] [address], [place], [place] [address]    Special Requests   Final    BOTTLES DRAWN AEROBIC ONLY Blood Culture adequate volume Performed at [facility] [address] [place], [place] [address]    Culture   Final    NO GROWTH 3 DAYS Performed at [facility], [place] [address] [place], [place] [address]    Report Status PENDING  Incomplete  Culture, blood (Routine X 2) w Reflex to ID Panel     Status: None (Preliminary result)   Collection Time: 12/07/21 10:53 AM   Specimen: BLOOD RIGHT WRIST  Result Value Ref Range Status   Specimen Description   Final    BLOOD RIGHT WRIST Performed at [facility] [address] [place], [place] [address]    Special Requests   Final    BOTTLES DRAWN AEROBIC ONLY Blood Culture adequate volume Performed at [facility] [address], [place], [place] [address]    Culture   Final    NO GROWTH 3 DAYS Performed at [facility], [place] [address] [place], [place] [address]    Report Status PENDING  Incomplete     Labs: BNP (last 3 results) No results for input(s): "BNP" in the last 8760 hours. Basic Metabolic Panel: Recent Labs  Lab 12/06/21 1420 12/06/21 1715 12/07/21 0358 12/08/21 1220  NA 138  --  142 139  K 3.6  --  3.8 3.7  CL 107  --  113* 109  CO2 24  --  22 26  GLUCOSE 117*  --  115* 105*  BUN 16  --  15 7*  CREATININE 0.99 0.84 0.86 0.63  CALCIUM 9.0  --  8.4* 8.1*   Liver Function Tests: Recent Labs  Lab 12/06/21 1420 12/07/21  1610 12/08/21 1220  AST _0 ALT _1 ALKPHOS 72 62 63  BILITOT 1.2 0.7 0.4  PROT 6.6 5.5* 5.7*  ALBUMIN 3.3* 2.8* 2.7*   No results for input(s): "LIPASE", "AMYLASE" in the last 168 hours. No results for input(s): "AMMONIA" in the last 168  hours. CBC: Recent Labs  Lab 12/06/21 1420 12/06/21 1715 12/07/21 0358 12/08/21 1220 12/10/21 0326  WBC 2.7* 3.2* 2.3* 1.9* 2.0*  NEUTROABS 2.3  --   --   --  1.1*  HGB 10.3* 8.0* 8.5* 7.9* 7.9*  HCT 30.7* 25.0* 26.3* 24.2* 24.2*  MCV 114.6* 117.9* 116.9* 115.8* 115.2*  PLT 152 134* 127* 172 206   Cardiac Enzymes: No results for input(s): "CKTOTAL", "CKMB", "CKMBINDEX", "TROPONINI" in the last 168 hours. BNP: Invalid input(s): "POCBNP" CBG: No results for input(s): "GLUCAP" in the last 168 hours. D-Dimer No results for input(s): "DDIMER" in the last 72 hours. Hgb A1c No results for input(s): "HGBA1C" in the last 72 hours. Lipid Profile No results for input(s): "CHOL", "HDL", "LDLCALC", "TRIG", "CHOLHDL", "LDLDIRECT" in the last 72 hours. Thyroid function studies No results for input(s): "TSH", "T4TOTAL", "T3FREE", "THYROIDAB" in the last 72 hours.  Invalid input(s): "FREET3" Anemia work up No results for input(s): "VITAMINB12", "FOLATE", "FERRITIN", "TIBC", "IRON", "RETICCTPCT" in the last 72 hours. Urinalysis    Component Value Date/Time   COLORURINE YELLOW 12/06/2021 1620   APPEARANCEUR HAZY (A) 12/06/2021 1620   LABSPEC 1.029 12/06/2021 1620   PHURINE 5.0 12/06/2021 1620   GLUCOSEU NEGATIVE 12/06/2021 1620   HGBUR NEGATIVE 12/06/2021 1620   BILIRUBINUR NEGATIVE 12/06/2021 1620   KETONESUR 20 (A) 12/06/2021 1620   PROTEINUR 100 (A) 12/06/2021 1620   NITRITE NEGATIVE 12/06/2021 1620   LEUKOCYTESUR LARGE (A) 12/06/2021 1620   Sepsis Labs Recent Labs  Lab 12/06/21 1715 12/07/21 0358 12/08/21 1220 12/10/21 0326  WBC 3.2* 2.3* 1.9* 2.0*   Microbiology Recent Results (from the past 240 hour(s))  Blood Culture (routine x 2)     Status: None (Preliminary result)   Collection Time: 12/06/21  2:35 PM   Specimen: BLOOD  Result Value Ref Range Status   Specimen Description   Final    BLOOD BLOOD LEFT ARM Performed at [facility], [place]  [address], [place], [place] [address]    Special Requests   Final    BOTTLES DRAWN AEROBIC AND ANAEROBIC Blood Culture adequate volume Performed at [facility] [address], [place], [place] [address]    Culture   Final    NO GROWTH 4 DAYS Performed at [facility], [place] [address], [place], [place] [address]    Report Status PENDING  Incomplete  Blood Culture (routine x 2)     Status: Abnormal   Collection Time: 12/06/21  2:35 PM   Specimen: BLOOD  Result Value Ref Range Status   Specimen Description   Final    BLOOD BLOOD LEFT HAND Performed at [facility] [address] [place], [place] [address]    Special Requests   Final    BOTTLES DRAWN AEROBIC AND ANAEROBIC Blood Culture adequate volume Performed at [facility] [address] [place], [place] [address]    Culture  Setup Time   Final    GRAM POSITIVE COCCI AEROBIC BOTTLE ONLY CRITICAL RESULT CALLED TO, READ BACK BY AND VERIFIED WITH: PHARMD ELLEN JACKSON 12/07/21@ 4:42 BY TW Performed at [facility], [address] [place], [place] [address]    Culture GROUP B STREP(S.AGALACTIAE)ISOLATED (A)  Final   Report Status 12/09/2021 FINAL  Final   Organism ID, Bacteria GROUP B STREP(S.AGALACTIAE)ISOLATED  Final      Susceptibility   Group b strep(s.agalactiae)isolated - MIC*    CLINDAMYCIN <=0.25 SENSITIVE Sensitive     AMPICILLIN <=0.25 SENSITIVE Sensitive     ERYTHROMYCIN <=0.12 SENSITIVE Sensitive     VANCOMYCIN 0.5 SENSITIVE Sensitive     CEFTRIAXONE <=0.12 SENSITIVE Sensitive     LEVOFLOXACIN 0.5 SENSITIVE Sensitive     * GROUP B STREP(S.AGALACTIAE)ISOLATED  Blood Culture ID Panel (Reflexed)     Status: Abnormal   Collection Time: 12/06/21  2:35 PM  Result Value Ref Range Status   Enterococcus faecalis NOT DETECTED NOT DETECTED Final   Enterococcus Faecium NOT DETECTED NOT DETECTED Final   Listeria monocytogenes NOT DETECTED NOT DETECTED  Final   Staphylococcus species NOT DETECTED NOT DETECTED Final   Staphylococcus aureus (BCID) NOT DETECTED NOT DETECTED Final   Staphylococcus epidermidis NOT DETECTED NOT DETECTED Final   Staphylococcus lugdunensis NOT DETECTED NOT DETECTED Final   Streptococcus species DETECTED (A) NOT DETECTED Final    Comment: CRITICAL RESULT CALLED TO, READ BACK BY AND VERIFIED WITH: PHARMD ELLEN JACKSON 12/07/21@ 4:42 BY TW    Streptococcus agalactiae DETECTED (A) NOT DETECTED Final    Comment: CRITICAL RESULT CALLED TO, READ BACK BY AND VERIFIED WITH: PHARMD ELLEN JACKSON 12/07/21@ 4:42 BY TW    Streptococcus pneumoniae NOT DETECTED NOT DETECTED Final   Streptococcus pyogenes NOT DETECTED NOT DETECTED Final   A.calcoaceticus-baumannii NOT DETECTED NOT DETECTED Final   Bacteroides fragilis NOT DETECTED NOT DETECTED Final   Enterobacterales NOT DETECTED NOT DETECTED Final   Enterobacter cloacae complex NOT DETECTED NOT DETECTED Final   Escherichia coli NOT DETECTED NOT DETECTED Final   Klebsiella aerogenes NOT DETECTED NOT DETECTED Final   Klebsiella oxytoca NOT DETECTED NOT DETECTED Final   Klebsiella pneumoniae NOT DETECTED NOT DETECTED Final   Proteus species NOT DETECTED NOT DETECTED Final   Salmonella species NOT DETECTED NOT DETECTED Final   Serratia marcescens NOT DETECTED NOT DETECTED Final   Haemophilus influenzae NOT DETECTED NOT DETECTED Final   Neisseria meningitidis NOT DETECTED NOT DETECTED Final   Pseudomonas aeruginosa NOT DETECTED NOT DETECTED Final   Stenotrophomonas maltophilia NOT DETECTED NOT DETECTED Final   Candida albicans NOT DETECTED NOT DETECTED Final   Candida auris NOT DETECTED NOT DETECTED Final   Candida glabrata NOT DETECTED NOT DETECTED Final   Candida krusei NOT DETECTED NOT DETECTED Final   Candida parapsilosis NOT DETECTED NOT DETECTED Final   Candida tropicalis NOT DETECTED NOT DETECTED Final   Cryptococcus neoformans/gattii NOT DETECTED NOT DETECTED Final     Comment: Performed at [facility], [address] [place], [place] [address]  Urine Culture     Status: Abnormal   Collection Time: 12/06/21  4:20 PM   Specimen: In/Out Cath Urine  Result Value Ref Range Status   Specimen Description   Final    IN/OUT CATH URINE Performed at [facility] [address], [place], [place] [address]    Special Requests   Final    NONE Performed at [facility], [place] [address], [place], [place] [address]    Culture MULTIPLE SPECIES PRESENT, SUGGEST RECOLLECTION (A)  Final   Report Status 12/08/2021 FINAL  Final  Culture, blood (Routine X 2) w Reflex to ID Panel  Status: None (Preliminary result)   Collection Time: 12/07/21 10:52 AM   Specimen: BLOOD  Result Value Ref Range Status   Specimen Description   Final    BLOOD RIGHT ANTECUBITAL Performed at [facility] [address] [place], [place] [address]    Special Requests   Final    BOTTLES DRAWN AEROBIC ONLY Blood Culture adequate volume Performed at [facility] [address] [place], [place] [address]    Culture   Final    NO GROWTH 3 DAYS Performed at [facility], [place] [address], [place], [place] [address]    Report Status PENDING  Incomplete  Culture, blood (Routine X 2) w Reflex to ID Panel     Status: None (Preliminary result)   Collection Time: 12/07/21 10:53 AM   Specimen: BLOOD RIGHT WRIST  Result Value Ref Range Status   Specimen Description   Final    BLOOD RIGHT WRIST Performed at [facility] [address] [place], [place] [address]    Special Requests   Final    BOTTLES DRAWN AEROBIC ONLY Blood Culture adequate volume Performed at [facility] [address] [place], [place] [address]    Culture   Final    NO GROWTH 3 DAYS Performed at [facility], [place] [address], [place], [place] [address]    Report Status PENDING   Incomplete     Time coordinating discharge:  40 minutes  SIGNED:   Georgette Shell, MD  [facility] 12/10/2021, 3:34 PM

## 2021-12-10 NOTE — Progress Notes (Signed)
The patient is alert and oriented and has been seen by her physician. The orders for discharge were written. IV has been removed. Went over discharge instructions with patient. She is being discharged via wheelchair with all of her belongings.   

## 2021-12-11 ENCOUNTER — Telehealth: Payer: Self-pay

## 2021-12-11 ENCOUNTER — Other Ambulatory Visit (HOSPITAL_COMMUNITY): Payer: Self-pay

## 2021-12-11 ENCOUNTER — Inpatient Hospital Stay: Payer: Medicaid Other | Admitting: Adult Health

## 2021-12-11 ENCOUNTER — Inpatient Hospital Stay: Payer: Medicaid Other

## 2021-12-11 DIAGNOSIS — C50811 Malignant neoplasm of overlapping sites of right female breast: Secondary | ICD-10-CM | POA: Diagnosis not present

## 2021-12-11 NOTE — Telephone Encounter (Signed)
Transition Care Management Follow-up Telephone Call  Call completed with patient and her daughter, [name].  Date of discharge and from where: 12/10/2021, [facility] How have you been since you were released from the hospital? [name] said her mother is tired but her immune system is " coming back up."  Any questions or concerns? Yes- the patient explained that once when she was in th hospital, she felt a sharp pain on her right side below her ribs to her hip when she was in the process of sitting down on her bed. She said it only lasted a second. Since she has been home, [name] reports that her mother is wobbly getting back into bed and this pain has occurred a few more times.  [name] thinks it is her " muscles re-engaging." The pain only lasts a short time and occurs more when she is lowering herself to sit rather than sit to stand. I told her that I would let Ms [name], NP know of her concern.   Items Reviewed: Did the pt receive and understand the discharge instructions provided? Yes  Medications obtained and verified? Yes - she has all of her medications and [name] did not have any questions about the med regime.  Other? No  Any new allergies since your discharge? No  Dietary orders reviewed? Yes- she said her mother's appetite is starting to improve Do you have support at home? Yes - her daughter  [place] and Equipment/Supplies: Were home health services ordered? no If so, what is the name of the agency? N/a  Has the agency set up a time to come to the patient's home? not applicable Were any new equipment or medical supplies ordered?  No What is the name of the medical supply agency? N/a Were you able to get the supplies/equipment? not applicable Do you have any questions related to the use of the equipment or supplies? No  Functional Questionnaire: (I = Independent and D = Dependent) ADLs: needs assistance with personal care/ ADLS. Has cane, walker and wheelchair  to use as needed. She also has a hospital bed.  She receives PCS M-F , 2 hours/ day. [name] is going to call [facility] to put her mother on the waiting list for services.   She is using O2 @ 1.5 L most of the time.  [name] said that there are short periods of time when her mother doesn't need the O2.   Follow up appointments reviewed:  PCP Hospital f/u appt confirmed? Yes  Scheduled to see Geryl Rankins, NP - 01/05/2022.  [facility] f/u appt confirmed? Yes  Scheduled to see oncology-  12/19/2021 and [facility]-  01/10/2022,  Are transportation arrangements needed? No her mother uses [employer] or transportation through her insurance company. If their condition worsens, is the pt aware to call PCP or go to the Emergency Dept.? Yes Was the patient provided with contact information for the PCP's office or ED? Yes Was to pt encouraged to call back with questions or concerns? Yes

## 2021-12-12 ENCOUNTER — Other Ambulatory Visit: Payer: Self-pay | Admitting: *Deleted

## 2021-12-12 DIAGNOSIS — C50811 Malignant neoplasm of overlapping sites of right female breast: Secondary | ICD-10-CM | POA: Diagnosis not present

## 2021-12-12 LAB — CULTURE, BLOOD (ROUTINE X 2)
Culture: NO GROWTH
Culture: NO GROWTH
Special Requests: ADEQUATE
Special Requests: ADEQUATE

## 2021-12-12 NOTE — Patient Outreach (Signed)
Medicaid Managed Care   Nurse Care Manager Note  12/12/2021 Name:  Suzanne [name] MRN:  [id number] DOB:  [birth year]  Suzanne [name] is an 61 y.o. year old female who is a primary patient of Suzanne Pounds, NP.  The [facility] Managed Care Coordination team was consulted for assistance with:    Health Maintenance  [name] was given information about Medicaid Managed Care Coordination team services today. Suzanne [name] Patient agreed to services and verbal consent obtained.  Engaged with patient by telephone for follow up visit in response to provider referral for case management and/or care coordination services.   Assessments/Interventions:  Review of past medical history, allergies, medications, health status, including review of consultants reports, laboratory and other test data, was performed as part of comprehensive evaluation and provision of chronic care management services.  SDOH (Social Determinants of Health) assessments and interventions performed:   Care Plan  Allergies  Allergen Reactions   Ferumoxytol Other (See Comments)    Pt had hypersensitivity to Ferumoxytol. She had Facial Flushing. See note from 07/14/2021    Medications Reviewed Today     Reviewed by Suzanne Montane, RN (Registered Nurse) on 12/12/21 at 1441  Med List Status: <None>   Medication Order Taking? Sig Documenting Provider Last Dose Status Informant  acetaminophen (TYLENOL) 500 MG tablet [id number] Yes Take 1,000 mg by mouth every 6 (six) hours as needed for mild pain. [provider] Taking Active Self  amoxicillin (AMOXIL) 500 MG capsule [id number] Yes Take 2 capsules (1,000 mg total) by mouth 3 (three) times daily for 10 days. Suzanne Shell, MD Taking Active   anastrozole (ARIMIDEX) 1 MG tablet [id number] Yes Take 1 tablet (1 mg total) by mouth daily. Benay Pike, MD Taking Active Self  Blood Pressure Monitor MISC [id number] Yes Medium sized cuff. Use to check blood pressure  twice per day and more often if not feeling well. I10.0 Suzanne Pounds, NP Taking Active Self  cetirizine (ALLERGY RELIEF CETIRIZINE) 10 MG tablet [id number]  Take 1 tablet (10 mg total) by mouth daily. Suzanne Pounds, NP  Expired 12/06/21 2359 Self  famotidine (PEPCID) 20 MG tablet [id number] Yes Take 1 tablet (20 mg total) by mouth 2 (two) times daily as needed for heartburn or indigestion. Suzanne [name] Dad, MD Taking Active Self  folic acid (FOLVITE) 1 MG tablet [id number] Yes Take 1 tablet (1 mg total) by mouth daily. Benay Pike, MD Taking Active Self  furosemide (LASIX) 20 MG tablet [id number] Yes Take 1 tablet (20 mg total) by mouth daily. Benay Pike, MD Taking Active Self  Ibuprofen-Acetaminophen (ADVIL DUAL ACTION) 125-250 MG TABS [id number] Yes Take 2 tablets by mouth daily as needed (pain). [provider] Taking Active Self  ketoconazole (NIZORAL) 2 % cream [id number] Yes Apply topically daily. Suzanne Shell, MD Taking Active   MELATONIN TR 1 MG TBCR [id number] Yes Take 1 mg by mouth at bedtime. [provider] Taking Active Self  nystatin (MYCOSTATIN/NYSTOP) powder [id number] Yes Apply 1 application topically as needed.  Patient taking differently: Apply 1 application  topically daily as needed (skin irritation).   Suzanne Phlegm, NP Taking Active Self  OYSTER [name] CALCIUM PLUS D 500-5 MG-MCG TABS [id number] Yes TAKE ONE TABLET BY MOUTH EVERY MORNING and TAKE ONE TABLET BY MOUTH AT NOON and TAKE ONE TABLET BY MOUTH EVERY EVENING  Patient taking differently: Take 1 tablet by mouth 2 (two) times daily.   Suzanne [name] Dad, MD Taking  Active Self           Med Note (Toryn Dewalt A   Mon Jun 05, 2021  1:21 PM) Taking twice daily  palbociclib (IBRANCE) 75 MG tablet [id number] No TAKE 1 TABLET (75 MG TOTAL) BY MOUTH DAILY. TAKE FOR 21 DAYS ON, 7 DAYS OFF, REPEAT EVERY 28 DAYS  Patient not taking: Reported on 12/12/2021   Benay Pike, MD Not Taking  Active Self           Med Note Suzanne [name]   Wed Dec 06, 2021  4:07 PM) Started on 12-03-21 for the 3 weeks  polyethylene glycol (MIRALAX / GLYCOLAX) 17 g packet [id number] Yes Take 17 g by mouth as needed.  Patient taking differently: Take 17 g by mouth daily as needed for mild constipation.   Suzanne [name] Dad, MD Taking Active Self  sertraline (ZOLOFT) 50 MG tablet [id number] Yes TAKE 1 AND 1/2 TABLETS BY MOUTH EVERYDAY AT BEDTIME  Patient taking differently: Take 75 mg by mouth daily.   Suzanne Pounds, NP Taking Active Self  Med List Note Suzanne [name], RPH-CPP 11/13/19 9892): Suzanne [name] filled at [facility]            Patient Active Problem List   Diagnosis Date Noted   Sepsis due to cellulitis ([place]) 12/06/2021   B12 deficiency anemia 08/15/2020   Intractable back pain 02/03/2019   Thoracic compression fracture, closed, initial encounter ([place]) 02/03/2019   Essential hypertension 02/03/2019   Genetic testing 01/27/2019   Acute pharyngitis    Bacteremia    FTT (failure to thrive) in adult    Fracture closed, humerus, shaft 01/09/2019   Pneumonia 12/25/2018   Acute respiratory disorder in immunocompromised patient ([place]) 12/25/2018   Family history of breast cancer    Family history of prostate cancer    Family history of melanoma    Family history of colon cancer    Pressure injury of skin 11/24/2018   Chronic respiratory failure with hypoxia and hypercapnia ([place]) 11/19/2018   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) 10/31/2018   Lung metastasis 10/31/2018   Pain from bone metastases ([place]) 10/31/2018   Morbid obesity with BMI of 40.0-44.9, adult ([place]) 10/31/2018   Bone injury    Pleural effusion, bilateral    Shortness of breath    Abnormal breast finding    Hypokalemia    Breast skin changes    Macrocytic anemia    Malignant neoplasm metastatic to bone (HCC)    Anemia, B12 deficiency 10/26/2018    Conditions to be addressed/monitored per PCP order:   Health Maintenance  Care Plan : RN Care Manager Plan of Care  Updates made by Suzanne Montane, RN since 12/12/2021 12:00 AM     Problem: Health Management Coordination Needs      Long-Range Goal: Development of Plan of Care for needs related to Health Management   Start Date: 01/26/2021  Expected End Date: 01/25/2022  Priority: High  Note:   Current Barriers:  Chronic Disease Management support and education needs related to Health  Maintenance-Ms. Kruser was admitted 7/12-7/16 for sepsis. She is at [name] and feeling better and has improved appetite. She has all of her medications and continues to receive PCS. Ms. Delpozo denies any needs at this time.  RNCM Clinical Goal(s):  Patient will verbalize understanding of plan for management of Health Maintenance attend all scheduled medical appointments: 12/19/21 with Oncology and 01/05/22 with PCP  continue to work with RN Care Manager to address care management and care coordination needs related to Health Maintenance  through collaboration with RN Care manager, provider, and care team.   Interventions: Inter-disciplinary care team collaboration (see longitudinal plan of care) Evaluation of current treatment plan related to  self management and patient's adherence to plan as established by provider Provided therapeutic listening Discussed referral to LCSW for grief management-patient declines, reports managing grief better at this time   Health Maintenance  (Status: Goal on Track  (progressing): YES.) Evaluation of current treatment plan related to  Health Maintenance ,  self-management and patient's adherence to plan as established by provider. Discussed plans with patient for ongoing care management follow up and provided patient with direct contact information for care management team Reviewed scheduled/upcoming provider appointments including Iron infusion 12/19/21 with Oncology and 01/05/22 with PCP; Discussed plans with patient for ongoing care management follow up and provided patient with direct contact information for care management team; Reviewed medications-verified patient is taking prescribed antibiotic SDOH assessment  Patient Goals/Self-Care Activities: Patient will self administer medications as prescribed Patient will attend all scheduled provider appointments Patient will call pharmacy for medication refills Patient will call provider office for new concerns or questions Patient will work with BSW to address care coordination needs and will continue to work with the clinical team to address health care and disease management related needs.         Follow Up:  Patient agrees to Care Plan and Follow-up.  Plan: The Managed Medicaid care management team will reach out to the patient again over the next 30 days.  Date/time of next scheduled RN care management/care coordination outreach:  01/12/22 @ 2:30pm  [name] RN, BSN [place] RN Care Coordinator

## 2021-12-12 NOTE — Patient Instructions (Signed)
Visit Information  Ms. Slaubaugh was given information about Medicaid Managed Care team care coordination services as a part of their [place] Medicaid benefit. [name] verbally consented to engagement with the [facility] Managed Care team.   If you are experiencing a medical emergency, please call 911 or report to your local emergency department or urgent care.   If you have a non-emergency medical problem during routine business hours, please contact your provider's office and ask to speak with a nurse.   For questions related to your [facility], please call: [phone] or visit the homepage here: [email or web address]  If you would like to schedule transportation through your [facility], please call the following number at least 2 days in advance of your appointment: [phone]   Rides for urgent appointments can also be made after hours by calling Member Services.  Call the [place] at [phone], at any time, 24 hours a day, 7 days a week. If you are in danger or need immediate medical attention call 911.  If you would like help to quit smoking, call 1-800-QUIT-NOW 9096417643) OR Espaol: 1-855-Djelo-Ya (2-376-283-1517) o para ms informacin haga clic aqu or Text READY to 200-400 to register via text  Ms. Heenan,   Please see education materials related to low white blood cell count provided by MyChart link.  Patient verbalizes understanding of instructions and care plan provided today and agrees to view in [place]. Active MyChart status and patient understanding of how to access instructions and care plan via MyChart confirmed with patient.     Telephone follow up appointment with Managed Medicaid care management team member scheduled for:01/12/22 @ 2:30pm  [name] RN, BSN [place] RN  Care Coordinator   Following is a copy of your plan of care:  Care Plan : RN Care Manager Plan of Care  Updates made by Melissa Montane, RN since 12/12/2021 12:00 AM     Problem: Health Management Coordination Needs      Long-Range Goal: Development of Plan of Care for needs related to Health Management   Start Date: 01/26/2021  Expected End Date: 01/25/2022  Priority: High  Note:   Current Barriers:  Chronic Disease Management support and education needs related to Health Maintenance-Ms. Varney was admitted 7/12-7/16 for sepsis. She is at home and feeling better and has improved appetite. She has all of her medications and continues to receive PCS. Ms. Angelo denies any needs at this time.  RNCM Clinical Goal(s):  Patient will verbalize understanding of plan for management of Health Maintenance attend all scheduled medical appointments: 12/19/21 with Oncology and 01/05/22 with PCP  continue to work with RN Care Manager to address care management and care coordination needs related to Health Maintenance  through collaboration with RN Care manager, provider, and care team.   Interventions: Inter-disciplinary care team collaboration (see longitudinal plan of care) Evaluation of current treatment plan related to  self management and patient's adherence to plan as established by provider Provided therapeutic listening Discussed referral to LCSW for grief management-patient declines, reports managing grief better at this time   Health Maintenance  (Status: Goal on Track (progressing): YES.) Evaluation of current treatment plan related to  Health Maintenance ,  self-management and patient's adherence to plan as established by provider. Discussed plans with patient for ongoing care management follow up and provided patient with direct contact information for care management team Reviewed scheduled/upcoming  provider appointments including Iron infusion 12/19/21 with Oncology and 01/05/22 with  PCP; Discussed plans with patient for ongoing care management follow up and provided patient with direct contact information for care management team; Reviewed medications-verified patient is taking prescribed antibiotic SDOH assessment  Patient Goals/Self-Care Activities: Patient will self administer medications as prescribed Patient will attend all scheduled provider appointments Patient will call pharmacy for medication refills Patient will call provider office for new concerns or questions Patient will work with BSW to address care coordination needs and will continue to work with the clinical team to address health care and disease management related needs.

## 2021-12-13 DIAGNOSIS — C50811 Malignant neoplasm of overlapping sites of right female breast: Secondary | ICD-10-CM | POA: Diagnosis not present

## 2021-12-13 LAB — CULTURE, BLOOD (ROUTINE X 2): Special Requests: ADEQUATE

## 2021-12-14 ENCOUNTER — Telehealth: Payer: Self-pay | Admitting: *Deleted

## 2021-12-14 DIAGNOSIS — C50811 Malignant neoplasm of overlapping sites of right female breast: Secondary | ICD-10-CM | POA: Diagnosis not present

## 2021-12-14 NOTE — Telephone Encounter (Signed)
This RN spoke with [name] per her VM stating concern with noted decrease in O2 sat - she states her mother did not sleep well last night but her sats are dropping down to 91-93 with 1 episode of 88.  [name] increased the oxygen to 2//min with noted reading staying more at 93%.  Kera has no other complaints- and no new coughing or difficulty breathing.  This RN suggesting for pt to take therapeutic breaths like she is blowing bubbles (or if they have them to blow bubbles) to help her lungs expand better.  Advised her to not increase the oxygen any higher then 2L/min but if the sats did decrease to 88 or lower to call on call.  [name] will monitor and call this RN in the AM with update for further recommendations if needed.

## 2021-12-15 DIAGNOSIS — C50811 Malignant neoplasm of overlapping sites of right female breast: Secondary | ICD-10-CM | POA: Diagnosis not present

## 2021-12-18 DIAGNOSIS — C50811 Malignant neoplasm of overlapping sites of right female breast: Secondary | ICD-10-CM | POA: Diagnosis not present

## 2021-12-19 ENCOUNTER — Inpatient Hospital Stay: Payer: Medicaid Other

## 2021-12-19 ENCOUNTER — Encounter: Payer: Self-pay | Admitting: Adult Health

## 2021-12-19 ENCOUNTER — Inpatient Hospital Stay (HOSPITAL_BASED_OUTPATIENT_CLINIC_OR_DEPARTMENT_OTHER): Payer: Medicaid Other | Admitting: Adult Health

## 2021-12-19 ENCOUNTER — Other Ambulatory Visit: Payer: Self-pay

## 2021-12-19 ENCOUNTER — Inpatient Hospital Stay: Payer: Medicaid Other | Attending: Adult Health

## 2021-12-19 VITALS — BP 115/62 | HR 94 | Temp 99.3°F | Resp 18 | Ht 59.0 in | Wt 185.2 lb

## 2021-12-19 DIAGNOSIS — C50911 Malignant neoplasm of unspecified site of right female breast: Secondary | ICD-10-CM

## 2021-12-19 DIAGNOSIS — C7951 Secondary malignant neoplasm of bone: Secondary | ICD-10-CM

## 2021-12-19 DIAGNOSIS — C50811 Malignant neoplasm of overlapping sites of right female breast: Secondary | ICD-10-CM

## 2021-12-19 DIAGNOSIS — Z170 Estrogen receptor positive status [ER+]: Secondary | ICD-10-CM | POA: Diagnosis not present

## 2021-12-19 DIAGNOSIS — J90 Pleural effusion, not elsewhere classified: Secondary | ICD-10-CM

## 2021-12-19 DIAGNOSIS — D518 Other vitamin B12 deficiency anemias: Secondary | ICD-10-CM

## 2021-12-19 LAB — CBC WITH DIFFERENTIAL (CANCER CENTER ONLY)
Abs Immature Granulocytes: 0.02 10*3/uL (ref 0.00–0.07)
Basophils Absolute: 0.1 10*3/uL (ref 0.0–0.1)
Basophils Relative: 2 %
Eosinophils Absolute: 0.1 10*3/uL (ref 0.0–0.5)
Eosinophils Relative: 2 %
HCT: 30.7 % — ABNORMAL LOW (ref 36.0–46.0)
Hemoglobin: 10.1 g/dL — ABNORMAL LOW (ref 12.0–15.0)
Immature Granulocytes: 0 %
Lymphocytes Relative: 14 %
Lymphs Abs: 0.8 10*3/uL (ref 0.7–4.0)
MCH: 36.3 pg — ABNORMAL HIGH (ref 26.0–34.0)
MCHC: 32.9 g/dL (ref 30.0–36.0)
MCV: 110.4 fL — ABNORMAL HIGH (ref 80.0–100.0)
Monocytes Absolute: 0.5 10*3/uL (ref 0.1–1.0)
Monocytes Relative: 9 %
Neutro Abs: 4 10*3/uL (ref 1.7–7.7)
Neutrophils Relative %: 73 %
Platelet Count: 274 10*3/uL (ref 150–400)
RBC: 2.78 MIL/uL — ABNORMAL LOW (ref 3.87–5.11)
RDW: 14.4 % (ref 11.5–15.5)
WBC Count: 5.4 10*3/uL (ref 4.0–10.5)
nRBC: 0.4 % — ABNORMAL HIGH (ref 0.0–0.2)

## 2021-12-19 LAB — CMP (CANCER CENTER ONLY)
ALT: 14 U/L (ref 0–44)
AST: 26 U/L (ref 15–41)
Albumin: 4 g/dL (ref 3.5–5.0)
Alkaline Phosphatase: 112 U/L (ref 38–126)
Anion gap: 8 (ref 5–15)
BUN: 11 mg/dL (ref 8–23)
CO2: 31 mmol/L (ref 22–32)
Calcium: 9.6 mg/dL (ref 8.9–10.3)
Chloride: 100 mmol/L (ref 98–111)
Creatinine: 0.82 mg/dL (ref 0.44–1.00)
GFR, Estimated: >60 mL/min (ref >60)
Glucose, Bld: 121 mg/dL — ABNORMAL HIGH (ref 70–99)
Potassium: 3.5 mmol/L (ref 3.5–5.1)
Sodium: 139 mmol/L (ref 135–145)
Total Bilirubin: 0.5 mg/dL (ref 0.3–1.2)
Total Protein: 7 g/dL (ref 6.5–8.1)

## 2021-12-19 LAB — FERRITIN: Ferritin: 50 ng/mL (ref 11–307)

## 2021-12-19 LAB — VITAMIN B12: Vitamin B-12: 781 pg/mL (ref 180–914)

## 2021-12-19 MED ORDER — DENOSUMAB 120 MG/1.7ML ~~LOC~~ SOLN
120.0000 mg | Freq: Once | SUBCUTANEOUS | Status: AC
  Administered 2021-12-19: 120 mg via SUBCUTANEOUS
  Filled 2021-12-19: qty 1.7

## 2021-12-19 NOTE — Progress Notes (Signed)
Pt is taking Anastrozole as prescribed with occasional hot flashes.  Wilber Bihari, NP, notified.

## 2021-12-19 NOTE — Patient Instructions (Signed)
Denosumab injection What is this medication? DENOSUMAB (den oh sue mab) slows bone breakdown. Prolia is used to treat osteoporosis in women after menopause and in men, and in people who are taking corticosteroids for 6 months or more. Xgeva is used to treat a high calcium level due to cancer and to prevent bone fractures and other bone problems caused by multiple myeloma or cancer bone metastases. Xgeva is also used to treat giant cell tumor of the bone. This medicine may be used for other purposes; ask your health care provider or pharmacist if you have questions. COMMON BRAND NAME(S): Prolia, XGEVA What should I tell my care team before I take this medication? They need to know if you have any of these conditions: dental disease having surgery or tooth extraction infection kidney disease low levels of calcium or Vitamin D in the blood malnutrition on hemodialysis skin conditions or sensitivity thyroid or parathyroid disease an unusual reaction to denosumab, other medicines, foods, dyes, or preservatives pregnant or trying to get pregnant breast-feeding How should I use this medication? This medicine is for injection under the skin. It is given by a health care professional in a hospital or clinic setting. A special MedGuide will be given to you before each treatment. Be sure to read this information carefully each time. For Prolia, talk to your pediatrician regarding the use of this medicine in children. Special care may be needed. For Xgeva, talk to your pediatrician regarding the use of this medicine in children. While this drug may be prescribed for children as young as 13 years for selected conditions, precautions do apply. Overdosage: If you think you have taken too much of this medicine contact a poison control center or emergency room at once. NOTE: This medicine is only for you. Do not share this medicine with others. What if I miss a dose? It is important not to miss your dose.  Call your doctor or health care professional if you are unable to keep an appointment. What may interact with this medication? Do not take this medicine with any of the following medications: other medicines containing denosumab This medicine may also interact with the following medications: medicines that lower your chance of fighting infection steroid medicines like prednisone or cortisone This list may not describe all possible interactions. Give your health care provider a list of all the medicines, herbs, non-prescription drugs, or dietary supplements you use. Also tell them if you smoke, drink alcohol, or use illegal drugs. Some items may interact with your medicine. What should I watch for while using this medication? Visit your doctor or health care professional for regular checks on your progress. Your doctor or health care professional may order blood tests and other tests to see how you are doing. Call your doctor or health care professional for advice if you get a fever, chills or sore throat, or other symptoms of a cold or flu. Do not treat yourself. This drug may decrease your body's ability to fight infection. Try to avoid being around people who are sick. You should make sure you get enough calcium and vitamin D while you are taking this medicine, unless your doctor tells you not to. Discuss the foods you eat and the vitamins you take with your health care professional. See your dentist regularly. Brush and floss your teeth as directed. Before you have any dental work done, tell your dentist you are receiving this medicine. Do not become pregnant while taking this medicine or for 5 months after   stopping it. Talk with your doctor or health care professional about your birth control options while taking this medicine. Women should inform their doctor if they wish to become pregnant or think they might be pregnant. There is a potential for serious side effects to an unborn child. Talk to  your health care professional or pharmacist for more information. What side effects may I notice from receiving this medication? Side effects that you should report to your doctor or health care professional as soon as possible: allergic reactions like skin rash, itching or hives, swelling of the face, lips, or tongue bone pain breathing problems dizziness jaw pain, especially after dental work redness, blistering, peeling of the skin signs and symptoms of infection like fever or chills; cough; sore throat; pain or trouble passing urine signs of low calcium like fast heartbeat, muscle cramps or muscle pain; pain, tingling, numbness in the hands or feet; seizures unusual bleeding or bruising unusually weak or tired Side effects that usually do not require medical attention (report to your doctor or health care professional if they continue or are bothersome): constipation diarrhea headache joint pain loss of appetite muscle pain runny nose tiredness upset stomach This list may not describe all possible side effects. Call your doctor for medical advice about side effects. You may report side effects to FDA at 1-800-FDA-1088. Where should I keep my medication? This medicine is only given in a clinic, doctor's office, or other health care setting and will not be stored at home. NOTE: This sheet is a summary. It may not cover all possible information. If you have questions about this medicine, talk to your doctor, pharmacist, or health care provider.  2023 Elsevier/Gold Standard (2017-09-20 00:00:00)  

## 2021-12-19 NOTE — Progress Notes (Signed)
[facility] Follow up:    [name], NP [address] [place] [address]   DIAGNOSIS: Stage IV breast cancer  SUMMARY OF ONCOLOGIC HISTORY:  [place], [place] woman presenting 10/26/2018 with right-sided inflammatory breast cancer, stage IV, involving lungs, lymph nodes and bones, as follows:             (a) chest CT scan 10/26/2018 shows bilateral pleural effusions, possible lymphangitic spread of tumor, diffuse bony metastatic disease, and significant axillary mediastinal and hilar adenopathy             (b) bone scan 10/27/2018 is a "near [place]" consistent with widespread bony metastatic disease             (c) head CT with and without contrast 10/30/2018 shows no intracranial metastatic disease, multiple calvarial lesions             (d) CA-27-29 on 10/27/2018 was 1810.4   (1) pleural fluid from right thoracentesis 10/28/2018 confirms malignant cells consistent with a breast primary, strongly estrogen and progesterone receptor positive, HER-2 not amplified, with an MIB-1 of 2%   (2) anastrozole started 10/29/2018             (a) palbociclib started 11/13/2018 at 100 mg dose             (b) denosumab/xgeva started 11/13/2018              (C) palbociclib dose decreased to 42m  (D) Palbociclib dose decreased to 75 mg every other day in 11/2021   (3) associated problems:             (a) hypoxia secondary to effusions             (b) pain from bone lesions and left humeral and vertebral compression fractures             (c) right upper extremity lymphedema             (d) poor venous access   (4) genetics testing on 12/25/2018 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 STK11, TP53, TSC1, TSC2, and VHL.  The following genes were evaluated for sequence changes only: SDHA and HOXB13 c.251G>A variant only. The [facility] Melanoma Panel analyzed the following 9 genes: BAP1 BRCA2 CDK4 CDKN2A MITF POT1 PTEN RB1 Tp53.    (5) palliative radiation to the left humerus 07/20/2019 - 07/31/2019 Site Technique Total Dose (Gy) Dose per Fx (Gy) Completed Fx Beam Energies  Humerus, Left: Ext_Lt_humerus Complex 30/30 3 10/10 6X    (6) anemia requiring transfusion:              (a) B-12 level 134 FEB 2022: B-12 injectons started              (B) iron deficient, IV iron to start 2/6   CURRENT THERAPY: Anastrozole, XBrock Bad(on hold)  INTERVAL HISTORY: LGEOVANNA SIMKO61y.o. female returns for follow-up of her stage IV breast cancer.  She is status post recent hospitalization from December 06, 2021 through December 10, 2021.  She experienced cellulitis and subsequent sepsis.  CBC on admission was completed however no differential was done until the day of her discharge which was December 10, 2021.  Her neutrophil count at that time was 1.1.  She tells me that she was off of the [place] during her admission because of the cellulitis.  Oral tells me that since her hospital discharge she has been feeling well.  She has 1 more dose of oral antibiotics and her breast is healing up quite well.  She has no significant issues or concerns today.  She tells me that her strength is improving slowly but steadily.  She is taking anastrozole and has some mild hot flashes from this however otherwise she is doing well.  During her hospitalization she underwent a CT chest abdomen and pelvis which showed no new or progressive breast  cancer.   Patient Active Problem List   Diagnosis Date Noted   Sepsis due to cellulitis ([place]) 12/06/2021   B12 deficiency anemia 08/15/2020   Intractable back pain 02/03/2019   Thoracic compression fracture, closed, initial encounter ([facility]) 02/03/2019   Essential hypertension 02/03/2019   Genetic testing 01/27/2019   Acute pharyngitis    Bacteremia    FTT (failure to thrive) in adult    Fracture closed, humerus, shaft 01/09/2019   Pneumonia 12/25/2018   Acute respiratory disorder in immunocompromised patient ([facility]) 12/25/2018   Family history of breast cancer    Family history of prostate cancer    Family history of melanoma    Family history of colon cancer    Pressure injury of skin 11/24/2018   Chronic respiratory failure with hypoxia and hypercapnia ([place] [facility]) 11/19/2018   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   Hypophosphatemia 11/19/2018   Hypocalcemia 11/19/2018   Aspiration pneumonia (HCC) 11/19/2018   SOB (shortness of breath)    Palliative care by specialist    Carcinoma of breast, estrogen receptor positive, stage 4, right ([facility]) 11/16/2018   Hypoalbuminemia 11/16/2018   Pleural effusion 11/14/2018   Goals of care, counseling/discussion 11/06/2018   Malignant neoplasm of overlapping sites of right breast in female, estrogen receptor positive ([facility]) 10/31/2018   Lung metastasis 10/31/2018   Pain from bone metastases ([facility]) 10/31/2018   Morbid obesity with BMI of 40.0-44.9, adult ([facility]) 10/31/2018   Bone injury    Pleural effusion, bilateral    Shortness of breath    Abnormal breast finding    Hypokalemia    Breast skin changes    Macrocytic anemia    Malignant neoplasm metastatic to bone (HCC)    Anemia, B12 deficiency 10/26/2018    is allergic to ferumoxytol.  MEDICAL HISTORY: Past Medical History:  Diagnosis Date   Cancer [facility])    Family history of breast cancer    Family history of colon cancer    Family history of melanoma    Family history of prostate  cancer    Gallstones    Glaucoma    Mastitis    reports history of recurrent mastitis   Metastatic breast cancer    Obesity     SURGICAL HISTORY: Past Surgical History:  Procedure Laterality Date   GLAUCOMA SURGERY  late 1990's   IR THORACENTESIS ASP PLEURAL SPACE W/IMG GUIDE  10/28/2018   IR THORACENTESIS ASP PLEURAL SPACE W/IMG GUIDE  10/29/2018   OPEN REDUCTION INTERNAL FIXATION (ORIF) DISTAL RADIAL FRACTURE Left 01/09/2019   Procedure: OPEN REDUCTION INTERNAL FIXATION (  ORIF) HUMERAL FRACTURE;  Surgeon: Netta Cedars, MD;  Location: [facility];  Service: Orthopedics;  Laterality: Left;    SOCIAL HISTORY: Social History   Socioeconomic History   Marital status: Widowed    Spouse name: Not on file   Number of children: 1   Years of education: Not on file   Highest education level: Not on file  Occupational History   Occupation: Clinical research associate Programs    Comment: Self-employed  Tobacco Use   Smoking status: Never   Smokeless tobacco: Never   Tobacco comments:    second hand smoke exposure  Vaping Use   Vaping Use: Never used  Substance and Sexual Activity   Alcohol use: No   Drug use: No   Sexual activity: Not Currently  Other Topics Concern   Not on file  Social History Narrative   Has a daughter named [name]. Daughter has CP.   Social Determinants of Health   Financial Resource Strain: Low Risk  (08/16/2021)   Overall Financial Resource Strain (CARDIA)    Difficulty of Paying Living Expenses: Not hard at all  Food Insecurity: Food Insecurity Present (11/08/2021)   Hunger Vital Sign    Worried About Running Out of Food in the Last Year: Sometimes true    Ran Out of Food in the Last Year: Sometimes true  Transportation Needs: No Transportation Needs (09/15/2021)   PRAPARE - Hydrologist (Medical): No    Lack of Transportation (Non-Medical): No  Physical Activity: Not on file  Stress: Stress Concern Present (11/08/2021)   [name]    Feeling of Stress : To some extent  Social Connections: Socially Isolated (03/27/2021)   Social Connection and Isolation Panel [NHANES]    Frequency of Communication with Friends and Family: Once a week    Frequency of Social Gatherings with Friends and Family: More than three times a week    Attends Religious Services: Never    Marine scientist or Organizations: No    Attends Archivist Meetings: Never    Marital Status: Widowed  Intimate Partner Violence: Not At Risk (12/24/2018)   Humiliation, Afraid, Rape, and Kick questionnaire    Fear of Current or Ex-Partner: No    Emotionally Abused: No    Physically Abused: No    Sexually Abused: No    FAMILY HISTORY: Family History  Problem Relation Age of Onset   Breast cancer Sister        in her 51's-70s   Heart disease Brother        CABG   Heart disease Brother        CABG   Skin cancer Brother        melanoma   Colon cancer Cousin    Heart attack Mother    Heart attack Father    Prostate cancer Brother     Review of Systems  Constitutional:  Negative for appetite change, chills, fatigue, fever and unexpected weight change.  HENT:   Negative for hearing loss, lump/mass and trouble swallowing.   Eyes:  Negative for eye problems and icterus.  Respiratory:  Negative for chest tightness, cough and shortness of breath.   Cardiovascular:  Negative for chest pain, leg swelling and palpitations.  Gastrointestinal:  Negative for abdominal distention, abdominal pain, constipation, diarrhea, nausea and vomiting.  Endocrine: Positive for hot flashes.  Genitourinary:  Negative for difficulty urinating.   Musculoskeletal:  Negative for  arthralgias.  Skin:  Negative for itching and rash.  Neurological:  Negative for dizziness, extremity weakness, headaches and numbness.  Hematological:  Negative for adenopathy. Does not bruise/bleed easily.   Psychiatric/Behavioral:  Negative for depression. The patient is not nervous/anxious.       PHYSICAL EXAMINATION  ECOG PERFORMANCE STATUS: 2 - Symptomatic, <50% confined to bed  Vitals:   12/19/21 1001  BP: 115/62  Pulse: 94  Resp: 18  Temp: 99.3 F (37.4 C)  SpO2: 95%    Physical Exam Constitutional:      General: She is not in acute distress.    Appearance: Normal appearance. She is not toxic-appearing.  HENT:     Head: Normocephalic and atraumatic.  Eyes:     General: No scleral icterus. Cardiovascular:     Rate and Rhythm: Normal rate and regular rhythm.     Pulses: Normal pulses.     Heart sounds: Normal heart sounds.  Pulmonary:     Effort: Pulmonary effort is normal.     Breath sounds: Normal breath sounds.  Abdominal:     General: Abdomen is flat. Bowel sounds are normal.     Palpations: Abdomen is soft.  Musculoskeletal:        General: No swelling.     Cervical back: Neck supple.  Lymphadenopathy:     Cervical: No cervical adenopathy.  Skin:    General: Skin is warm and dry.     Findings: No rash.  Neurological:     General: No focal deficit present.     Mental Status: She is alert.  Psychiatric:        Mood and Affect: Mood normal.        Behavior: Behavior normal.     LABORATORY DATA:  CBC    Component Value Date/Time   WBC 5.4 12/19/2021 0939   WBC 2.0 (L) 12/10/2021 0326   RBC 2.78 (L) 12/19/2021 0939   HGB 10.1 (L) 12/19/2021 0939   HCT 30.7 (L) 12/19/2021 0939   PLT 274 12/19/2021 0939   MCV 110.4 (H) 12/19/2021 0939   MCH 36.3 (H) 12/19/2021 0939   MCHC 32.9 12/19/2021 0939   RDW 14.4 12/19/2021 0939   LYMPHSABS 0.8 12/19/2021 0939   MONOABS 0.5 12/19/2021 0939   EOSABS 0.1 12/19/2021 0939   BASOSABS 0.1 12/19/2021 0939    CMP     Component Value Date/Time   NA 139 12/19/2021 0939   K 3.5 12/19/2021 0939   CL 100 12/19/2021 0939   CO2 31 12/19/2021 0939   GLUCOSE 121 (H) 12/19/2021 0939   BUN 11 12/19/2021 0939    CREATININE 0.82 12/19/2021 0939   CALCIUM 9.6 12/19/2021 0939   PROT 7.0 12/19/2021 0939   ALBUMIN 4.0 12/19/2021 0939   AST 26 12/19/2021 0939   ALT 14 12/19/2021 0939   ALKPHOS 112 12/19/2021 0939   BILITOT 0.5 12/19/2021 0939   GFRNONAA >60 12/19/2021 0939   GFRAA >60 02/24/2020 1454   GFRAA >60 07/24/2019 1453       ASSESSMENT and THERAPY PLAN:   Carcinoma of breast, estrogen receptor positive, stage 4, right (HCC) Daleisa Halperin is a 61 year old woman with metastatic breast cancer diagnosed in May 2020 and began treatment with anastrozole, palbociclib, Xgeva beginning in June 2020.  Rahima has continued on her initial treatment and her most recent imaging on July 13 while hospitalized shows no new or progressive cancer.  During her hospitalization for cellulitis the [name] was held and she is looking  for guidance on when to restart.  I reviewed [name] with her.  Her white blood cells were 5.4, absolute neutrophil count was 4, hemoglobin 10.1, platelets 274.  Considering the improvement since her discharge on July 16 I recommended that she restart the palbociclib at 75 mg every other day for 11 doses.  This is equivalent to the 3 weeks on and 1 week off.  She will return and see Dr. Chryl Heck in mid August for labs and follow-up on whether to stay at that dose or go back up to the 75 mg every day.  [name] will proceed with her Delton See today.  She is tolerating this well and has no other concerns today.   All questions were answered. The patient knows to call the clinic with any problems, questions or concerns. We can certainly see the patient much sooner if necessary.  Total encounter time:20 minutes*in face-to-face visit time, chart review, lab review, care coordination, order entry, and documentation of the encounter time.    Wilber Bihari, NP 12/19/21 1:14 PM Medical Oncology and Hematology [facility] [place], [place] [address] Tel.  [phone]    Fax. [phone]  *Total Encounter Time as defined by the Centers for Medicare and Medicaid Services includes, in addition to the face-to-face time of a patient visit (documented in the note above) non-face-to-face time: obtaining and reviewing outside history, ordering and reviewing medications, tests or procedures, care coordination (communications with other health care professionals or caregivers) and documentation in the medical record.

## 2021-12-19 NOTE — Assessment & Plan Note (Signed)
Suzanne [name] is a 61 year old woman with metastatic breast cancer diagnosed in May 2020 and began treatment with anastrozole, palbociclib, Xgeva beginning in June 2020.  Suzanne [name] has continued on her initial treatment and her most recent imaging on July 13 while hospitalized shows no new or progressive cancer.  During her hospitalization for cellulitis the Suzanne [name] was held and she is looking for guidance on when to restart.  I reviewed Suzanne [name] with her.  Her white blood cells were 5.4, absolute neutrophil count was 4, hemoglobin 10.1, platelets 274.  Considering the improvement since her discharge on July 16 I recommended that she restart the palbociclib at 75 mg every other day for 11 doses.  This is equivalent to the 3 weeks on and 1 week off.  She will return and see Suzanne [name] in mid August for labs and follow-up on whether to stay at that dose or go back up to the 75 mg every day.  Suzanne [name] will proceed with her Delton See today.  She is tolerating this well and has no other concerns today.

## 2021-12-20 ENCOUNTER — Other Ambulatory Visit (HOSPITAL_COMMUNITY): Payer: Self-pay

## 2021-12-20 DIAGNOSIS — C50811 Malignant neoplasm of overlapping sites of right female breast: Secondary | ICD-10-CM | POA: Diagnosis not present

## 2021-12-20 LAB — CANCER ANTIGEN 27.29: CA 27.29: 323.6 U/mL — ABNORMAL HIGH (ref 0.0–38.6)

## 2021-12-21 ENCOUNTER — Telehealth: Payer: Self-pay | Admitting: *Deleted

## 2021-12-21 DIAGNOSIS — C50811 Malignant neoplasm of overlapping sites of right female breast: Secondary | ICD-10-CM | POA: Diagnosis not present

## 2021-12-21 NOTE — Telephone Encounter (Signed)
This  RN returned VM to pt and daughter - per concern for completion of antibiotic yesterday pm and continued breast issues.  Pt was seen Tuesday by [facility]/NP post discharge from the hospital due to cellulitis of her right breast.  Pt calls today with concern relating the right breast - and if she need to extend antibiotic use post completion pm yesterday.  Pt states overall improvement of the breast with no increased warmth or swelling. She has not had any fevers.  She does state breast is sore " underneath and the skin below it ".  She states area has known yeast " and the skin is a little cracked and it is tender "  She was discharged with use of nystatin powder.  She had ketoconazole cream in the home.  She is using a cotton cloth between her breast and skin to decrease skin to skin issues.  This RN reviewed above with provider with concern yeast may be more aggravated secondary to antibiotic use. Due to breast itself showing overall improvement - need is to manage yeast for better control.  This RN informed pt of above - including recommendation to rinse area at least 2 times a day ideally with dial antibiotic soap and then apply nystatin powder at one rinsing and then use ketoconazole with 2nd rinse.  Pt and daughter verbalized  understanding of above as well as to call this RN tomorrow with update on breast.

## 2021-12-22 DIAGNOSIS — C50811 Malignant neoplasm of overlapping sites of right female breast: Secondary | ICD-10-CM | POA: Diagnosis not present

## 2021-12-25 ENCOUNTER — Other Ambulatory Visit: Payer: Self-pay | Admitting: Nurse Practitioner

## 2021-12-25 DIAGNOSIS — C50811 Malignant neoplasm of overlapping sites of right female breast: Secondary | ICD-10-CM | POA: Diagnosis not present

## 2021-12-25 DIAGNOSIS — F329 Major depressive disorder, single episode, unspecified: Secondary | ICD-10-CM

## 2021-12-26 DIAGNOSIS — R0602 Shortness of breath: Secondary | ICD-10-CM | POA: Diagnosis not present

## 2021-12-26 DIAGNOSIS — J9621 Acute and chronic respiratory failure with hypoxia: Secondary | ICD-10-CM | POA: Diagnosis not present

## 2021-12-26 DIAGNOSIS — S22000A Wedge compression fracture of unspecified thoracic vertebra, initial encounter for closed fracture: Secondary | ICD-10-CM | POA: Diagnosis not present

## 2021-12-26 DIAGNOSIS — C7801 Secondary malignant neoplasm of right lung: Secondary | ICD-10-CM | POA: Diagnosis not present

## 2021-12-26 DIAGNOSIS — C50811 Malignant neoplasm of overlapping sites of right female breast: Secondary | ICD-10-CM | POA: Diagnosis not present

## 2021-12-27 DIAGNOSIS — C50811 Malignant neoplasm of overlapping sites of right female breast: Secondary | ICD-10-CM | POA: Diagnosis not present

## 2021-12-28 ENCOUNTER — Telehealth: Payer: Self-pay | Admitting: *Deleted

## 2021-12-28 ENCOUNTER — Ambulatory Visit: Payer: Self-pay

## 2021-12-28 DIAGNOSIS — C50811 Malignant neoplasm of overlapping sites of right female breast: Secondary | ICD-10-CM | POA: Diagnosis not present

## 2021-12-28 NOTE — Telephone Encounter (Signed)
    Chief Complaint: Pt. Has a yeast rash under both breast and on her abdomen. Red, small amount of bloody drainage. Using Nystatin powder and cloth under breast . Rash persists. 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  Answer Assessment - Initial Assessment Questions 1. APPEARANCE of RASH: "Describe the rash."      Red, moist,  2. LOCATION: "Where is the rash located?"      Under both breast 3. NUMBER: "How many spots are there?"      Large areas 4. SIZE: "How big are the spots?" (Inches, centimeters or compare to size of a coin)      Large 5. ONSET: "When did the rash start?"      Several weeks 6. ITCHING: "Does the rash itch?" If Yes, ask: "How bad is the itch?"  (Scale 0-10; or none, mild, moderate, severe)     No 7. PAIN: "Does the rash hurt?" If Yes, ask: "How bad is the pain?"  (Scale 0-10; or none, mild, moderate, severe)    - NONE (0): no pain    - MILD (1-3): doesn't interfere with normal activities     - MODERATE (4-7): interferes with normal activities or awakens from sleep     - SEVERE (8-10): excruciating pain, unable to do any normal activities     Stinging 8. OTHER SYMPTOMS: "Do you have any other symptoms?" (e.g., fever)     No 9. PREGNANCY: "Is there any chance you are pregnant?" "When was your last menstrual period?"     No  Protocols used: Rash or Redness - Localized-A-AH

## 2021-12-28 NOTE — Telephone Encounter (Signed)
This RN spoke with the patient's daughter- [name] - who states rash in skin fold of lower abd does not seem to be improving and now when washing they are noting some bleeding.  Note pt was recently d/ed  from hospital for cellulitis of her breast.  Pt is currently on ibrance 75 mg every other day.  They have spoken with their primary MD who does not have openings until next week.  Per discussion they will come in to this office tomorrow for evaluation.  Appointment made by this RN.

## 2021-12-28 NOTE — Telephone Encounter (Signed)
Advised to keep area clean, dry, and continue with measures taken already.   If worsens before apt time, 01/05/2022, can f/u with [facility].

## 2021-12-29 ENCOUNTER — Inpatient Hospital Stay: Payer: Medicaid Other | Attending: Adult Health | Admitting: Physician Assistant

## 2021-12-29 VITALS — BP 112/77 | HR 99 | Temp 98.3°F | Resp 17 | Wt 180.5 lb

## 2021-12-29 DIAGNOSIS — C50911 Malignant neoplasm of unspecified site of right female breast: Secondary | ICD-10-CM | POA: Diagnosis not present

## 2021-12-29 DIAGNOSIS — Z170 Estrogen receptor positive status [ER+]: Secondary | ICD-10-CM | POA: Insufficient documentation

## 2021-12-29 DIAGNOSIS — C7951 Secondary malignant neoplasm of bone: Secondary | ICD-10-CM | POA: Insufficient documentation

## 2021-12-29 DIAGNOSIS — C779 Secondary and unspecified malignant neoplasm of lymph node, unspecified: Secondary | ICD-10-CM | POA: Diagnosis not present

## 2021-12-29 DIAGNOSIS — Z79899 Other long term (current) drug therapy: Secondary | ICD-10-CM | POA: Diagnosis not present

## 2021-12-29 DIAGNOSIS — Z800 Family history of malignant neoplasm of digestive organs: Secondary | ICD-10-CM | POA: Insufficient documentation

## 2021-12-29 DIAGNOSIS — Z79811 Long term (current) use of aromatase inhibitors: Secondary | ICD-10-CM | POA: Insufficient documentation

## 2021-12-29 DIAGNOSIS — R21 Rash and other nonspecific skin eruption: Secondary | ICD-10-CM | POA: Diagnosis present

## 2021-12-29 DIAGNOSIS — C50811 Malignant neoplasm of overlapping sites of right female breast: Secondary | ICD-10-CM | POA: Diagnosis not present

## 2021-12-29 DIAGNOSIS — C7800 Secondary malignant neoplasm of unspecified lung: Secondary | ICD-10-CM | POA: Insufficient documentation

## 2021-12-29 DIAGNOSIS — Z808 Family history of malignant neoplasm of other organs or systems: Secondary | ICD-10-CM | POA: Diagnosis not present

## 2021-12-29 DIAGNOSIS — L304 Erythema intertrigo: Secondary | ICD-10-CM | POA: Insufficient documentation

## 2021-12-29 DIAGNOSIS — Z803 Family history of malignant neoplasm of breast: Secondary | ICD-10-CM | POA: Diagnosis not present

## 2021-12-29 NOTE — Telephone Encounter (Signed)
This needs to be evaluated by Oncology> yeast rash does not produce blood. Please have her reach out to oncology as she has breast cancer.

## 2021-12-29 NOTE — Telephone Encounter (Signed)
She went to Oncologist today. She was given measures to take. Keep using cream she has already and to make sure area is dry and use hair dryer on cool setting.   Feels better today than yesterday.   She has PAP next week.

## 2021-12-29 NOTE — Progress Notes (Signed)
Symptom Management Consult note [place]    Patient Care Team: [name], NP as PCP - General (Nurse Practitioner) Netta Cedars, MD as Consulting Physician (Orthopedic Surgery) Melissa Montane, RN as Case Manager Ethelda Chick as Social Worker Benay Pike, MD as Consulting Physician (Hematology and Oncology)    Name of the patient: Suzanne [name]  [id number]  02-25-1961   Date of visit: 12/29/2021    Chief complaint/ Reason for visit- rash  Oncology History  Malignant neoplasm metastatic to bone [facility])    Current Therapy: Anastrozole, Brock Bad   Interval history- Suzanne [name] is a 61 y.o. with oncologic history as above presenting to [facility] today with chief complaint of rash under bilateral breasts and abdomen x 3 years.  Patient's daughter accompanies her and provides additional history.  Patient states ever since she was diagnosed with breast cancer she has had persistent yeast infections under her breasts. Patient had recent hospital admission 07/12-07/16 for sepsis due to cellulitis. At discharge she was prescribed ketoconazole cream however did not start using it until x 3 days ago.  Patient's daughter helps wash the areas twice daily.  After washing they will pat dry with a towel.  During the day they applied a pillowcase underneath her breast to help keep the area dry.  After washing the area x3 days ago she noticed some bleeding.  Patient states she overall feels well.  She has no associated pain.  She denies fever, chills, chest pain, shortness of breath.      ROS  All other systems are reviewed and are negative for acute change except as noted in the HPI.    Allergies  Allergen Reactions   Ferumoxytol Other (See Comments)    Pt had hypersensitivity to Ferumoxytol. She had Facial Flushing. See note from 07/14/2021     Past Medical History:  Diagnosis Date   Cancer [facility])    Family history of breast cancer    Family  history of colon cancer    Family history of melanoma    Family history of prostate cancer    Gallstones    Glaucoma    Mastitis    reports history of recurrent mastitis   Metastatic breast cancer    Obesity      Past Surgical History:  Procedure Laterality Date   GLAUCOMA SURGERY  late 1990's   IR THORACENTESIS ASP PLEURAL SPACE W/IMG GUIDE  10/28/2018   IR THORACENTESIS ASP PLEURAL SPACE W/IMG GUIDE  10/29/2018   OPEN REDUCTION INTERNAL FIXATION (ORIF) DISTAL RADIAL FRACTURE Left 01/09/2019   Procedure: OPEN REDUCTION INTERNAL FIXATION (ORIF) HUMERAL FRACTURE;  Surgeon: Netta Cedars, MD;  Location: [facility];  Service: Orthopedics;  Laterality: Left;    Social History   Socioeconomic History   Marital status: Widowed    Spouse name: Not on file   Number of children: 1   Years of education: Not on file   Highest education level: Not on file  Occupational History   Occupation: Clinical research associate Programs    Comment: Self-employed  Tobacco Use   Smoking status: Never   Smokeless tobacco: Never   Tobacco comments:    second hand smoke exposure  Vaping Use   Vaping Use: Never used  Substance and Sexual Activity   Alcohol use: No   Drug use: No   Sexual activity: Not Currently  Other Topics Concern   Not on file  Social History Narrative   Has a  daughter named [name]. Daughter has CP.   Social Determinants of Health   Financial Resource Strain: Low Risk  (08/16/2021)   Overall Financial Resource Strain (CARDIA)    Difficulty of Paying Living Expenses: Not hard at all  Food Insecurity: Food Insecurity Present (11/08/2021)   Hunger Vital Sign    Worried About Running Out of Food in the Last Year: Sometimes true    Ran Out of Food in the Last Year: Sometimes true  Transportation Needs: No Transportation Needs (09/15/2021)   PRAPARE - Hydrologist (Medical): No    Lack of Transportation (Non-Medical): No  Physical Activity: Not on file   Stress: Stress Concern Present (11/08/2021)   [place]    Feeling of Stress : To some extent  Social Connections: Socially Isolated (03/27/2021)   Social Connection and Isolation Panel [NHANES]    Frequency of Communication with Friends and Family: Once a week    Frequency of Social Gatherings with Friends and Family: More than three times a week    Attends Religious Services: Never    Marine scientist or Organizations: No    Attends Archivist Meetings: Never    Marital Status: Widowed  Intimate Partner Violence: Not At Risk (12/24/2018)   Humiliation, Afraid, Rape, and Kick questionnaire    Fear of Current or Ex-Partner: No    Emotionally Abused: No    Physically Abused: No    Sexually Abused: No    Family History  Problem Relation Age of Onset   Breast cancer Sister        in her 72's-70s   Heart disease Brother        CABG   Heart disease Brother        CABG   Skin cancer Brother        melanoma   Colon cancer Cousin    Heart attack Mother    Heart attack Father    Prostate cancer Brother      Current Outpatient Medications:    acetaminophen (TYLENOL) 500 MG tablet, Take 1,000 mg by mouth every 6 (six) hours as needed for mild pain., Disp: , Rfl:    anastrozole (ARIMIDEX) 1 MG tablet, Take 1 tablet (1 mg total) by mouth daily., Disp: 90 tablet, Rfl: 4   Blood Pressure Monitor MISC, Medium sized cuff. Use to check blood pressure twice per day and more often if not feeling well. I10.0, Disp: 1 each, Rfl: 0   cetirizine (ALLERGY RELIEF CETIRIZINE) 10 MG tablet, Take 1 tablet (10 mg total) by mouth daily., Disp: 90 tablet, Rfl: 3   famotidine (PEPCID) 20 MG tablet, Take 1 tablet (20 mg total) by mouth 2 (two) times daily as needed for heartburn or indigestion., Disp: 30 tablet, Rfl: 3   folic acid (FOLVITE) 1 MG tablet, Take 1 tablet (1 mg total) by mouth daily., Disp: 90 tablet, Rfl: 4    furosemide (LASIX) 20 MG tablet, Take 1 tablet (20 mg total) by mouth daily., Disp: 90 tablet, Rfl: 4   Ibuprofen-Acetaminophen (ADVIL DUAL ACTION) 125-250 MG TABS, Take 2 tablets by mouth daily as needed (pain)., Disp: , Rfl:    ketoconazole (NIZORAL) 2 % cream, Apply topically daily., Disp: 15 g, Rfl: 0   MELATONIN TR 1 MG TBCR, Take 1 mg by mouth at bedtime., Disp: , Rfl:    nystatin (MYCOSTATIN/NYSTOP) powder, Apply 1 application topically as needed. (Patient taking differently:  Apply 1 application  topically daily as needed (skin irritation).), Disp: 15 g, Rfl: 1   OYSTER SHELL CALCIUM PLUS D 500-5 MG-MCG TABS, TAKE ONE TABLET BY MOUTH EVERY MORNING and TAKE ONE TABLET BY MOUTH AT NOON and TAKE ONE TABLET BY MOUTH EVERY EVENING (Patient taking differently: Take 1 tablet by mouth 2 (two) times daily.), Disp: 90 tablet, Rfl: 1   palbociclib (IBRANCE) 75 MG tablet, TAKE 1 TABLET (75 MG TOTAL) BY MOUTH DAILY. TAKE FOR 21 DAYS ON, 7 DAYS OFF, REPEAT EVERY 28 DAYS (Patient not taking: Reported on 12/12/2021), Disp: 21 tablet, Rfl: 6   polyethylene glycol (MIRALAX / GLYCOLAX) 17 g packet, Take 17 g by mouth as needed. (Patient taking differently: Take 17 g by mouth daily as needed for mild constipation.), Disp: 14 each, Rfl: 6   sertraline (ZOLOFT) 50 MG tablet, TAKE 1 AND 1/2 TABLETS BY MOUTH EVERYDAY AT BEDTIME, Disp: 45 tablet, Rfl: 0  PHYSICAL EXAM: ECOG FS:2 - Symptomatic, <50% confined to bed    Vitals:   12/29/21 1242  BP: 112/77  Pulse: 99  Resp: 17  Temp: 98.3 F (36.8 C)  TempSrc: Oral  SpO2: 99%  Weight: 180 lb 8 oz (81.9 kg)   Physical Exam Vitals and nursing note reviewed.  Constitutional:      Appearance: She is well-developed. She is not ill-appearing or toxic-appearing.  HENT:     Head: Normocephalic and atraumatic.     Nose: Nose normal.  Eyes:     General: No scleral icterus.       Right eye: No discharge.        Left eye: No discharge.     Conjunctiva/sclera:  Conjunctivae normal.  Neck:     Vascular: No JVD.  Cardiovascular:     Rate and Rhythm: Normal rate and regular rhythm.     Pulses: Normal pulses.     Heart sounds: Normal heart sounds.  Pulmonary:     Effort: Pulmonary effort is normal.     Breath sounds: Normal breath sounds.  Chest:     Comments: Intertrigo between breasts and an inframammary folds.  No open wounds.  No bleeding.  No satellite papules or pustules.  No crusting, no purulence. Abdominal:     General: There is no distension.  Musculoskeletal:        General: Normal range of motion.     Cervical back: Normal range of motion.  Skin:    General: Skin is warm and dry.  Neurological:     Mental Status: She is oriented to person, place, and time.     GCS: GCS eye subscore is 4. GCS verbal subscore is 5. GCS motor subscore is 6.     Comments: Fluent speech, no facial droop.  Psychiatric:        Behavior: Behavior normal.        LABORATORY DATA: I have reviewed the data as listed    Latest Ref Rng & Units 12/19/2021    9:39 AM 12/10/2021    3:26 AM 12/08/2021   12:20 PM  CBC  WBC 4.0 - 10.5 K/uL 5.4  2.0  1.9   Hemoglobin 12.0 - 15.0 g/dL 10.1  7.9  7.9   Hematocrit 36.0 - 46.0 % 30.7  24.2  24.2   Platelets 150 - 400 K/uL 274  206  172         Latest Ref Rng & Units 12/19/2021    9:39 AM 12/08/2021   12:20 PM  12/07/2021    3:58 AM  CMP  Glucose 70 - 99 mg/dL 121  105  115   BUN 8 - 23 mg/dL '11  7  15   '$ Creatinine 0.44 - 1.00 mg/dL 0.82  0.63  0.86   Sodium 135 - 145 mmol/L 139  139  142   Potassium 3.5 - 5.1 mmol/L 3.5  3.7  3.8   Chloride 98 - 111 mmol/L 100  109  113   CO2 22 - 32 mmol/L '31  26  22   '$ Calcium 8.9 - 10.3 mg/dL 9.6  8.1  8.4   Total Protein 6.5 - 8.1 g/dL 7.0  5.7  5.5   Total Bilirubin 0.3 - 1.2 mg/dL 0.5  0.4  0.7   Alkaline Phos 38 - 126 U/L 112  63  62   AST 15 - 41 U/L '26  18  18   '$ ALT 0 - 44 U/L '14  13  13        '$ RADIOGRAPHIC STUDIES (from last 24 hours if applicable) I  have personally reviewed the radiological images as listed and agreed with the findings in the report. No results found.     ASSESSMENT & PLAN: Patient is a 61 y.o. 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 Chryl Heck.  I have viewed most recent oncology and hospitals admissions note as well as lab work.   #)Intertrigo- Patient is well appearing. No sign of cellulitis and no systemic symptoms. No indications for antibiotics at this time. She recently started alternating ketoconazole and nystatin powder with symptom improvement.  No signs of secondary bacterial infection. Encouraged her to continue that regimen and suggested drying the area with hair dryer on cool setting to avoid patting the area. Strict ED precautions discussed should symptoms worsen.   #) Stage IV breast cancer- Next appointment with oncologist is 01/09/22.   Visit Diagnosis: 1. Malignant neoplasm of overlapping sites of right breast in female, estrogen receptor positive ([place])   2. Intertrigo      No orders of the defined types were placed in this encounter.   All questions were answered. The patient knows to call the clinic with any problems, questions or concerns. No barriers to learning was detected.  I have spent a total of 20 minutes minutes of face-to-face and non-face-to-face time, preparing to see the patient, obtaining and/or reviewing separately obtained history, performing a medically appropriate examination, counseling and educating the patient, ordering tests, documenting clinical information in the electronic health record, and care coordination (communications with other health care professionals or caregivers).    Thank you for allowing me to participate in the care of this patient.    Barrie Folk, PA-C Department of Hematology/Oncology [facility] at [facility] Phone: [phone]  [phone]     12/29/2021 3:59 PM

## 2022-01-02 ENCOUNTER — Other Ambulatory Visit: Payer: Self-pay

## 2022-01-02 DIAGNOSIS — C50811 Malignant neoplasm of overlapping sites of right female breast: Secondary | ICD-10-CM | POA: Diagnosis not present

## 2022-01-03 DIAGNOSIS — C50811 Malignant neoplasm of overlapping sites of right female breast: Secondary | ICD-10-CM | POA: Diagnosis not present

## 2022-01-04 DIAGNOSIS — C50811 Malignant neoplasm of overlapping sites of right female breast: Secondary | ICD-10-CM | POA: Diagnosis not present

## 2022-01-05 ENCOUNTER — Encounter: Payer: Self-pay | Admitting: Nurse Practitioner

## 2022-01-05 ENCOUNTER — Ambulatory Visit: Payer: Medicaid Other | Attending: Nurse Practitioner | Admitting: Nurse Practitioner

## 2022-01-05 ENCOUNTER — Other Ambulatory Visit (HOSPITAL_COMMUNITY)
Admission: RE | Admit: 2022-01-05 | Discharge: 2022-01-05 | Disposition: A | Discharge status: Home / Self Care | Payer: Medicaid Other | Source: Ambulatory Visit | Attending: Nurse Practitioner | Admitting: Nurse Practitioner

## 2022-01-05 VITALS — BP 131/83 | HR 85 | Temp 99.5°F

## 2022-01-05 DIAGNOSIS — Z1159 Encounter for screening for other viral diseases: Secondary | ICD-10-CM

## 2022-01-05 DIAGNOSIS — Z124 Encounter for screening for malignant neoplasm of cervix: Secondary | ICD-10-CM

## 2022-01-05 NOTE — Progress Notes (Signed)
Assessment & Plan:  Suzanne [name] was seen today for gynecologic exam.  Diagnoses and all orders for this visit:  Encounter for Papanicolaou smear for cervical cancer screening -     Cytology - PAP -     Cervicovaginal ancillary only  Need for hepatitis C screening test -     HCV Ab w Reflex to Quant PCR    Patient has been counseled on age-appropriate routine health concerns for screening and prevention. These are reviewed and up-to-date. Referrals have been placed accordingly. Immunizations are up-to-date or declined.    Subjective:   Chief Complaint  Patient presents with   Gynecologic Exam    No concerns about the procedure   HPI Suzanne [name] 61 y.o. female presents to office today for pap smear.   Review of Systems  Constitutional: Negative.  Negative for chills, fever, malaise/fatigue and weight loss.  Respiratory: Negative.  Negative for cough, shortness of breath and wheezing.   Cardiovascular: Negative.  Negative for chest pain, orthopnea and leg swelling.  Gastrointestinal:  Negative for abdominal pain.  Genitourinary: Negative.  Negative for flank pain.  Skin:  Positive for itching and rash.  Psychiatric/Behavioral:  Negative for suicidal ideas.     Past Medical History:  Diagnosis Date   Cancer [facility])    Family history of breast cancer    Family history of colon cancer    Family history of melanoma    Family history of prostate cancer    Gallstones    Glaucoma    Mastitis    reports history of recurrent mastitis   Metastatic breast cancer    Obesity     Past Surgical History:  Procedure Laterality Date   GLAUCOMA SURGERY  late 1990's   IR THORACENTESIS ASP PLEURAL SPACE W/IMG GUIDE  10/28/2018   IR THORACENTESIS ASP PLEURAL SPACE W/IMG GUIDE  10/29/2018   OPEN REDUCTION INTERNAL FIXATION (ORIF) DISTAL RADIAL FRACTURE Left 01/09/2019   Procedure: OPEN REDUCTION INTERNAL FIXATION (ORIF) HUMERAL FRACTURE;  Surgeon: Netta Cedars, MD;  Location: [facility];   Service: Orthopedics;  Laterality: Left;    Family History  Problem Relation Age of Onset   Breast cancer Sister        in her 61's-70s   Heart disease Brother        CABG   Heart disease Brother        CABG   Skin cancer Brother        melanoma   Colon cancer Cousin    Heart attack Mother    Heart attack Father    Prostate cancer Brother     Social History Reviewed with no changes to be made today.   Outpatient Medications Prior to Visit  Medication Sig Dispense Refill   acetaminophen (TYLENOL) 500 MG tablet Take 1,000 mg by mouth every 6 (six) hours as needed for mild pain.     anastrozole (ARIMIDEX) 1 MG tablet Take 1 tablet (1 mg total) by mouth daily. 90 tablet 4   Blood Pressure Monitor MISC Medium sized cuff. Use to check blood pressure twice per day and more often if not feeling well. I10.0 1 each 0   cetirizine (ALLERGY RELIEF CETIRIZINE) 10 MG tablet Take 1 tablet (10 mg total) by mouth daily. 90 tablet 3   famotidine (PEPCID) 20 MG tablet Take 1 tablet (20 mg total) by mouth 2 (two) times daily as needed for heartburn or indigestion. 30 tablet 3   folic acid (FOLVITE) 1 MG tablet  Take 1 tablet (1 mg total) by mouth daily. 90 tablet 4   furosemide (LASIX) 20 MG tablet Take 1 tablet (20 mg total) by mouth daily. 90 tablet 4   Ibuprofen-Acetaminophen (ADVIL DUAL ACTION) 125-250 MG TABS Take 2 tablets by mouth daily as needed (pain).     ketoconazole (NIZORAL) 2 % cream Apply topically daily. 15 g 0   MELATONIN TR 1 MG TBCR Take 1 mg by mouth at bedtime.     nystatin (MYCOSTATIN/NYSTOP) powder Apply 1 application topically as needed. (Patient taking differently: Apply 1 application  topically daily as needed (skin irritation).) 15 g 1   OYSTER SHELL CALCIUM PLUS D 500-5 MG-MCG TABS TAKE ONE TABLET BY MOUTH EVERY MORNING and TAKE ONE TABLET BY MOUTH AT NOON and TAKE ONE TABLET BY MOUTH EVERY EVENING (Patient taking differently: Take 1 tablet by mouth 2 (two) times daily.) 90  tablet 1   palbociclib (IBRANCE) 75 MG tablet TAKE 1 TABLET (75 MG TOTAL) BY MOUTH DAILY. TAKE FOR 21 DAYS ON, 7 DAYS OFF, REPEAT EVERY 28 DAYS (Patient not taking: Reported on 12/12/2021) 21 tablet 6   polyethylene glycol (MIRALAX / GLYCOLAX) 17 g packet Take 17 g by mouth as needed. (Patient taking differently: Take 17 g by mouth daily as needed for mild constipation.) 14 each 6   sertraline (ZOLOFT) 50 MG tablet TAKE 1 AND 1/2 TABLETS BY MOUTH EVERYDAY AT BEDTIME 45 tablet 0   No facility-administered medications prior to visit.    Allergies  Allergen Reactions   Ferumoxytol Other (See Comments)    Pt had hypersensitivity to Ferumoxytol. She had Facial Flushing. See note from 07/14/2021       Objective:    BP 131/83 (BP Location: Right Arm, Patient Position: Sitting, Cuff Size: Large)   Pulse 85   Temp 99.5 F (37.5 C)   LMP 05/28/2013 (Within Months)  Wt Readings from Last 3 Encounters:  12/29/21 180 lb 8 oz (81.9 kg)  12/19/21 185 lb 3.2 oz (84 kg)  12/06/21 185 lb 3 oz (84 kg)    Physical Exam Exam conducted with a chaperone present.  Constitutional:      Appearance: She is well-developed.  HENT:     Head: Normocephalic.  Cardiovascular:     Rate and Rhythm: Normal rate and regular rhythm.     Heart sounds: Normal heart sounds.  Pulmonary:     Effort: Pulmonary effort is normal.     Breath sounds: Normal breath sounds.  Abdominal:     General: Bowel sounds are normal.     Palpations: Abdomen is soft.     Hernia: There is no hernia in the left inguinal area.  Genitourinary:    Exam position: Lithotomy position.     Labia:        Right: No rash, tenderness, lesion or injury.        Left: No rash, tenderness, lesion or injury.      Vagina: Normal. No signs of injury and foreign body. No vaginal discharge, erythema, tenderness or bleeding.     Cervix: Normal.     Uterus: Not deviated and not enlarged.      Adnexa:        Right: No mass, tenderness or fullness.          Left: No mass, tenderness or fullness.       Rectum: Normal. No external hemorrhoid.  Lymphadenopathy:     Lower Body: No right inguinal adenopathy. No left inguinal adenopathy.  Skin:    General: Skin is warm and dry.     Findings: Rash present.       Neurological:     Mental Status: She is alert and oriented to person, place, and time.  Psychiatric:        Behavior: Behavior normal.        Thought Content: Thought content normal.        Judgment: Judgment normal.          Patient has been counseled extensively about nutrition and exercise as well as the importance of adherence with medications and regular follow-up. The patient was given clear instructions to go to ER or return to medical center if symptoms don't improve, worsen or new problems develop. The patient verbalized understanding.   Follow-up: Return in about 6 months (around 07/08/2022).   [name], FNP-BC [facility] [place], [place]   01/05/2022, 2:39 PM

## 2022-01-06 LAB — HCV AB W REFLEX TO QUANT PCR: HCV Ab: NONREACTIVE

## 2022-01-06 LAB — HCV INTERPRETATION

## 2022-01-08 ENCOUNTER — Other Ambulatory Visit (HOSPITAL_COMMUNITY): Payer: Self-pay

## 2022-01-08 ENCOUNTER — Telehealth: Payer: Self-pay | Admitting: *Deleted

## 2022-01-08 ENCOUNTER — Other Ambulatory Visit: Payer: Self-pay | Admitting: Nurse Practitioner

## 2022-01-08 ENCOUNTER — Inpatient Hospital Stay (HOSPITAL_BASED_OUTPATIENT_CLINIC_OR_DEPARTMENT_OTHER): Payer: Medicaid Other | Admitting: Adult Health

## 2022-01-08 ENCOUNTER — Inpatient Hospital Stay: Payer: Medicaid Other

## 2022-01-08 ENCOUNTER — Other Ambulatory Visit: Payer: Self-pay

## 2022-01-08 ENCOUNTER — Encounter: Payer: Self-pay | Admitting: Adult Health

## 2022-01-08 VITALS — BP 130/86 | HR 109 | Temp 98.6°F | Resp 16 | Wt 178.1 lb

## 2022-01-08 DIAGNOSIS — Z170 Estrogen receptor positive status [ER+]: Secondary | ICD-10-CM | POA: Diagnosis not present

## 2022-01-08 DIAGNOSIS — C7951 Secondary malignant neoplasm of bone: Secondary | ICD-10-CM | POA: Diagnosis not present

## 2022-01-08 DIAGNOSIS — N760 Acute vaginitis: Secondary | ICD-10-CM

## 2022-01-08 DIAGNOSIS — B9689 Other specified bacterial agents as the cause of diseases classified elsewhere: Secondary | ICD-10-CM

## 2022-01-08 DIAGNOSIS — C50811 Malignant neoplasm of overlapping sites of right female breast: Secondary | ICD-10-CM

## 2022-01-08 DIAGNOSIS — J90 Pleural effusion, not elsewhere classified: Secondary | ICD-10-CM

## 2022-01-08 DIAGNOSIS — D518 Other vitamin B12 deficiency anemias: Secondary | ICD-10-CM

## 2022-01-08 DIAGNOSIS — L304 Erythema intertrigo: Secondary | ICD-10-CM | POA: Diagnosis not present

## 2022-01-08 LAB — CBC WITH DIFFERENTIAL (CANCER CENTER ONLY)
Abs Immature Granulocytes: 0.04 10*3/uL (ref 0.00–0.07)
Basophils Absolute: 0 10*3/uL (ref 0.0–0.1)
Basophils Relative: 1 %
Eosinophils Absolute: 0 10*3/uL (ref 0.0–0.5)
Eosinophils Relative: 1 %
HCT: 30.2 % — ABNORMAL LOW (ref 36.0–46.0)
Hemoglobin: 10 g/dL — ABNORMAL LOW (ref 12.0–15.0)
Immature Granulocytes: 1 %
Lymphocytes Relative: 10 %
Lymphs Abs: 0.5 10*3/uL — ABNORMAL LOW (ref 0.7–4.0)
MCH: 35.1 pg — ABNORMAL HIGH (ref 26.0–34.0)
MCHC: 33.1 g/dL (ref 30.0–36.0)
MCV: 106 fL — ABNORMAL HIGH (ref 80.0–100.0)
Monocytes Absolute: 0.2 10*3/uL (ref 0.1–1.0)
Monocytes Relative: 4 %
Neutro Abs: 4.1 10*3/uL (ref 1.7–7.7)
Neutrophils Relative %: 83 %
Platelet Count: 208 10*3/uL (ref 150–400)
RBC: 2.85 MIL/uL — ABNORMAL LOW (ref 3.87–5.11)
RDW: 15.5 % (ref 11.5–15.5)
Smear Review: NORMAL
WBC Count: 4.9 10*3/uL (ref 4.0–10.5)
nRBC: 0 % (ref 0.0–0.2)

## 2022-01-08 LAB — CERVICOVAGINAL ANCILLARY ONLY
Bacterial Vaginitis (gardnerella): POSITIVE — AB
Candida Glabrata: NEGATIVE
Candida Vaginitis: NEGATIVE
Chlamydia: NEGATIVE
Comment: NEGATIVE
Comment: NEGATIVE
Comment: NEGATIVE
Comment: NEGATIVE
Comment: NEGATIVE
Comment: NORMAL
Neisseria Gonorrhea: NEGATIVE
Trichomonas: NEGATIVE

## 2022-01-08 LAB — VITAMIN B12: Vitamin B-12: 719 pg/mL (ref 180–914)

## 2022-01-08 LAB — CMP (CANCER CENTER ONLY)
ALT: 9 U/L (ref 0–44)
AST: 17 U/L (ref 15–41)
Albumin: 4 g/dL (ref 3.5–5.0)
Alkaline Phosphatase: 97 U/L (ref 38–126)
Anion gap: 4 — ABNORMAL LOW (ref 5–15)
BUN: 17 mg/dL (ref 8–23)
CO2: 29 mmol/L (ref 22–32)
Calcium: 9 mg/dL (ref 8.9–10.3)
Chloride: 103 mmol/L (ref 98–111)
Creatinine: 0.99 mg/dL (ref 0.44–1.00)
GFR, Estimated: >60 mL/min (ref >60)
Glucose, Bld: 112 mg/dL — ABNORMAL HIGH (ref 70–99)
Potassium: 3.5 mmol/L (ref 3.5–5.1)
Sodium: 136 mmol/L (ref 135–145)
Total Bilirubin: 0.7 mg/dL (ref 0.3–1.2)
Total Protein: 7.2 g/dL (ref 6.5–8.1)

## 2022-01-08 MED ORDER — FLUCONAZOLE 200 MG PO TABS: ORAL_TABLET | ORAL | 0 refills | Status: DC

## 2022-01-08 MED ORDER — METRONIDAZOLE 500 MG PO TABS: 500.0000 mg | ORAL_TABLET | Freq: Two times a day (BID) | ORAL | 0 refills | Status: AC

## 2022-01-08 NOTE — Progress Notes (Signed)
[facility] Follow up:    Suzanne Pounds, NP [address] [place] [address]   DIAGNOSIS: Metastatic breast cancer  SUMMARY OF ONCOLOGIC HISTORY: [place], [place] woman presenting 10/26/2018 with right-sided inflammatory breast cancer, stage IV, involving lungs, lymph nodes and bones, as follows:             (a) chest CT scan 10/26/2018 shows bilateral pleural effusions, possible lymphangitic spread of tumor, diffuse bony metastatic disease, and significant axillary mediastinal and hilar adenopathy             (b) bone scan 10/27/2018 is a "near SuperScan" consistent with widespread bony metastatic disease             (c) head CT with and without contrast 10/30/2018 shows no intracranial metastatic disease, multiple calvarial lesions             (d) CA-27-29 on 10/27/2018 was 1810.4   (1) pleural fluid from right thoracentesis 10/28/2018 confirms malignant cells consistent with a breast primary, strongly estrogen and progesterone receptor positive, HER-2 not amplified, with an MIB-1 of 2%   (2) anastrozole started 10/29/2018             (a) palbociclib started 11/13/2018 at 100 mg dose             (b) denosumab/xgeva started 11/13/2018              (C) palbociclib dose decreased to 74m             (D) Palbociclib dose decreased to 75 mg every other day in 11/2021   (3) associated problems:             (a) hypoxia secondary to effusions             (b) pain from bone lesions and left humeral and vertebral compression fractures             (c) right upper extremity lymphedema             (d) poor venous access   (4) genetics testing on 12/25/2018 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 STK11, TP53, TSC1, TSC2, and VHL.  The following genes were evaluated for sequence changes only: SDHA and HOXB13 c.251G>A variant only. The [facility] Melanoma Panel analyzed the following 9 genes: BAP1 BRCA2 CDK4 CDKN2A MITF POT1 PTEN RB1 Tp53.    (5) palliative radiation to the left humerus 07/20/2019 - 07/31/2019 Site Technique Total Dose (Gy) Dose per Fx (Gy) Completed Fx Beam Energies  Humerus, Left: Ext_Lt_humerus Complex 30/30 3 10/10 6X    (6) anemia requiring transfusion:              (a) B-12 level 134 FEB 2022: B-12 injectons started              (B) iron deficient, IV iron to start 2/6     CURRENT THERAPY: Anastrozole, IBrock Bad INTERVAL HISTORY: Suzanne LAVALAIS61y.o. female  returns for urgent follow-up of continued skin rash underneath her breasts and on her breast.  She has a history of recent sepsis being discharged from the hospital on July 25.  She completed antibiotic therapy from that and her breast had much improved.  However over the past week and a half her breasts have continued to worsen and she is using the cream and powder as prescribed however it is not getting any better.  Her breasts are not itching and there is no tenderness or warmth that she has experienced.  She has an upcoming appointment with infectious disease on Wednesday.  Khala is taking anastrozole daily with good tolerance.  She is scheduled for the [facility] tomorrow which she tolerates well.  She has been taking Ibrance 75 mg every other day and her last pill is due tomorrow.   Patient Active Problem List   Diagnosis Date Noted   Sepsis due to cellulitis ([place]) 12/06/2021   B12 deficiency anemia 08/15/2020   Intractable  back pain 02/03/2019   Thoracic compression fracture, closed, initial encounter ([place]) 02/03/2019   Essential hypertension 02/03/2019   Genetic testing 01/27/2019   Acute pharyngitis    Bacteremia    FTT (failure to thrive) in adult    Fracture closed, humerus, shaft 01/09/2019   Pneumonia 12/25/2018   Acute respiratory disorder in immunocompromised patient ([place]) 12/25/2018   Family history of breast cancer    Family history of prostate cancer    Family history of melanoma    Family history of colon cancer    Pressure injury of skin 11/24/2018   Chronic respiratory failure with hypoxia and hypercapnia ([place]) 11/19/2018   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   Hypophosphatemia 11/19/2018   Hypocalcemia 11/19/2018   Aspiration pneumonia (HCC) 11/19/2018   SOB (shortness of breath)    Palliative care by specialist    Carcinoma of breast, estrogen receptor positive, stage 4, right ([place]) 11/16/2018   Hypoalbuminemia 11/16/2018   Pleural effusion 11/14/2018   Goals of care, counseling/discussion 11/06/2018   Malignant neoplasm of overlapping sites of right breast in female, estrogen receptor positive ([place]) 10/31/2018   Lung metastasis 10/31/2018   Pain from bone metastases ([place]) 10/31/2018   Morbid obesity with BMI of 40.0-44.9, adult ([place]) 10/31/2018   Bone injury    Pleural effusion, bilateral    Shortness of breath    Abnormal breast finding    Hypokalemia    Breast skin changes    Macrocytic anemia    Malignant neoplasm metastatic to bone (H[facility])    Anemia, B12 deficiency 10/26/2018    is allergic to ferumoxytol.  MEDICAL HISTORY: Past Medical History:  Diagnosis Date   Cancer [facility])    Family history of breast cancer    Family history of colon cancer    Family history of melanoma    Family history of prostate cancer    Gallstones    Glaucoma    Mastitis    reports history of recurrent mastitis   Metastatic breast cancer    Obesity     SURGICAL HISTORY: Past  Surgical History:  Procedure Laterality Date   GLAUCOMA SURGERY  late 1990's   IR THORACENTESIS ASP PLEURAL SPACE W/IMG GUIDE  10/28/2018   IR THORACENTESIS ASP PLEURAL SPACE W/IMG GUIDE  10/29/2018   OPEN REDUCTION INTERNAL FIXATION (ORIF) DISTAL RADIAL FRACTURE Left 01/09/2019   Procedure: OPEN REDUCTION INTERNAL FIXATION (ORIF) HUMERAL FRACTURE;  Surgeon: Netta Cedars, MD;  Location: [facility];  Service: Orthopedics;  Laterality: Left;  SOCIAL HISTORY: Social History   Socioeconomic History   Marital status: Widowed    Spouse name: Not on file   Number of children: 1   Years of education: Not on file   Highest education level: Not on file  Occupational History   Occupation: Clinical research associate Programs    Comment: Self-employed  Tobacco Use   Smoking status: Never   Smokeless tobacco: Never   Tobacco comments:    second hand smoke exposure  Vaping Use   Vaping Use: Never used  Substance and Sexual Activity   Alcohol use: No   Drug use: No   Sexual activity: Not Currently  Other Topics Concern   Not on file  Social History Narrative   Has a daughter named [name]. Daughter has CP.   Social Determinants of Health   Financial Resource Strain: Low Risk  (08/16/2021)   Overall Financial Resource Strain (CARDIA)    Difficulty of Paying Living Expenses: Not hard at all  Food Insecurity: Food Insecurity Present (11/08/2021)   Hunger Vital Sign    Worried About Running Out of Food in the Last Year: Sometimes true    Ran Out of Food in the Last Year: Sometimes true  Transportation Needs: No Transportation Needs (09/15/2021)   PRAPARE - Hydrologist (Medical): No    Lack of Transportation (Non-Medical): No  Physical Activity: Not on file  Stress: Stress Concern Present (11/08/2021)   [place]    Feeling of Stress : To some extent  Social Connections: Socially Isolated (03/27/2021)    Social Connection and Isolation Panel [NHANES]    Frequency of Communication with Friends and Family: Once a week    Frequency of Social Gatherings with Friends and Family: More than three times a week    Attends Religious Services: Never    Marine scientist or Organizations: No    Attends Archivist Meetings: Never    Marital Status: Widowed  Intimate Partner Violence: Not At Risk (12/24/2018)   Humiliation, Afraid, Rape, and Kick questionnaire    Fear of Current or Ex-Partner: No    Emotionally Abused: No    Physically Abused: No    Sexually Abused: No    FAMILY HISTORY: Family History  Problem Relation Age of Onset   Breast cancer Sister        in her 52's-70s   Heart disease Brother        CABG   Heart disease Brother        CABG   Skin cancer Brother        melanoma   Colon cancer Cousin    Heart attack Mother    Heart attack Father    Prostate cancer Brother     Review of Systems  Constitutional:  Positive for chills and fatigue. Negative for appetite change, fever and unexpected weight change.  HENT:   Negative for hearing loss, lump/mass and trouble swallowing.   Eyes:  Negative for eye problems and icterus.  Respiratory:  Negative for chest tightness, cough and shortness of breath.   Cardiovascular:  Negative for chest pain, leg swelling and palpitations.  Gastrointestinal:  Negative for abdominal distention, abdominal pain, constipation, diarrhea, nausea and vomiting.  Endocrine: Negative for hot flashes.  Genitourinary:  Negative for difficulty urinating.   Musculoskeletal:  Negative for arthralgias.  Skin:  Positive for rash. Negative for itching.  Neurological:  Negative for dizziness, extremity weakness,  headaches and numbness.  Hematological:  Negative for adenopathy. Does not bruise/bleed easily.  Psychiatric/Behavioral:  Negative for depression. The patient is not nervous/anxious.       PHYSICAL EXAMINATION  ECOG PERFORMANCE STATUS: 2  - Symptomatic, <50% confined to bed  Vitals:   01/08/22 1307 01/08/22 1322  BP: 130/86   Pulse: (!) 109   Resp: 16   Temp: (!) 97.5 F (36.4 C) 98.6 F (37 C)  SpO2: 100%     Physical Exam Constitutional:      General: She is not in acute distress.    Appearance: Normal appearance. She is not toxic-appearing.     Comments: Examined in wheelchair  HENT:     Head: Normocephalic and atraumatic.  Eyes:     General: No scleral icterus. Cardiovascular:     Rate and Rhythm: Normal rate and regular rhythm.     Pulses: Normal pulses.     Heart sounds: Normal heart sounds.  Pulmonary:     Effort: Pulmonary effort is normal.     Breath sounds: Normal breath sounds.  Abdominal:     General: Abdomen is flat. Bowel sounds are normal. There is no distension.     Palpations: Abdomen is soft.     Tenderness: There is no abdominal tenderness.  Musculoskeletal:        General: No swelling.     Cervical back: Neck supple.  Lymphadenopathy:     Cervical: No cervical adenopathy.  Skin:    General: Skin is warm and dry.     Findings: No rash.  Neurological:     General: No focal deficit present.     Mental Status: She is alert.  Psychiatric:        Mood and Affect: Mood normal.        Behavior: Behavior normal.     LABORATORY DATA:  CBC    Component Value Date/Time   WBC 4.9 01/08/2022 1242   WBC 2.0 (L) 12/10/2021 0326   RBC 2.85 (L) 01/08/2022 1242   HGB 10.0 (L) 01/08/2022 1242   HCT 30.2 (L) 01/08/2022 1242   PLT 208 01/08/2022 1242   MCV 106.0 (H) 01/08/2022 1242   MCH 35.1 (H) 01/08/2022 1242   MCHC 33.1 01/08/2022 1242   RDW 15.5 01/08/2022 1242   LYMPHSABS 0.5 (L) 01/08/2022 1242   MONOABS 0.2 01/08/2022 1242   EOSABS 0.0 01/08/2022 1242   BASOSABS 0.0 01/08/2022 1242    CMP     Component Value Date/Time   NA 136 01/08/2022 1242   K 3.5 01/08/2022 1242   CL 103 01/08/2022 1242   CO2 29 01/08/2022 1242   GLUCOSE 112 (H) 01/08/2022 1242   BUN 17  01/08/2022 1242   CREATININE 0.99 01/08/2022 1242   CALCIUM 9.0 01/08/2022 1242   PROT 7.2 01/08/2022 1242   ALBUMIN 4.0 01/08/2022 1242   AST 17 01/08/2022 1242   ALT 9 01/08/2022 1242   ALKPHOS 97 01/08/2022 1242   BILITOT 0.7 01/08/2022 1242   GFRNONAA >60 01/08/2022 1242   GFRAA >60 02/24/2020 1454   GFRAA >60 07/24/2019 1453      ASSESSMENT and THERAPY PLAN:   Malignant neoplasm metastatic to bone [facility]) Suzanne [name] is a 61 year old woman with metastatic breast cancer to the bone on treatment with anastrozole, Xgeva, and Ibrance.  Since she is here a day before her scheduled Delton See we will go ahead and administer the Xgeva today.  I discussed signs of sepsis or worsening status with her  daughter and she knows when she should take Jenny to the emergency room.  She is here today for follow-up and urgent evaluation of what appears to be a disseminated fungal skin rash.  Since topical treatments have failed I discussed this with my colleague Janene Madeira and infectious disease and we will do treatment of fluconazole 400 mg p.o. x1 followed by 200 mg p.o. daily.    We also discussed that if infectious disease wanted to do blood cultures or any other work-up or changes they could do so on Wednesday when she has her appointment.  We are working on getting home health into provide [facility] for her.    Since the skin has worsened I did recommend that Rasheda stop taking the [place] and she only has 1 dose.  She is not neutropenic today.  We will see her back in a couple weeks to see how her skin is doing.   All questions were answered. The patient knows to call the clinic with any problems, questions or concerns. We can certainly see the patient much sooner if necessary.  Total encounter time:30 minutes*in face-to-face visit time, chart review, lab review, care coordination, order entry, and documentation of the encounter time.   Wilber Bihari, NP 01/08/22 2:22 PM Medical Oncology and  Hematology [facility] [address] [place], [place] [address] Tel. [phone]    Fax. [phone]  *Total Encounter Time as defined by the Centers for Medicare and Medicaid Services includes, in addition to the face-to-face time of a patient visit (documented in the note above) non-face-to-face time: obtaining and reviewing outside history, ordering and reviewing medications, tests or procedures, care coordination (communications with other health care professionals or caregivers) and documentation in the medical record.

## 2022-01-08 NOTE — Assessment & Plan Note (Addendum)
Suzanne [name] is a 61 year old woman with metastatic breast cancer to the bone on treatment with anastrozole, Xgeva, and Ibrance.  Since she is here a day before her scheduled Delton See we will go ahead and administer the Xgeva today.  I discussed signs of sepsis or worsening status with her daughter and she knows when she should take [name] to the emergency room.  She is here today for follow-up and urgent evaluation of what appears to be a disseminated fungal skin rash.  Since topical treatments have failed I discussed this with my colleague Janene Madeira and infectious disease and we will do treatment of fluconazole 400 mg p.o. x1 followed by 200 mg p.o. daily.    We also discussed that if infectious disease wanted to do blood cultures or any other work-up or changes they could do so on Wednesday when she has her appointment.  We are working on getting home health into provide [facility] for her.    Since the skin has worsened I did recommend that [name] stop taking the [place] and she only has 1 dose.  She is not neutropenic today.  We will see her back in a couple weeks to see how her skin is doing.

## 2022-01-08 NOTE — Telephone Encounter (Signed)
This RN spoke with [name] this AM per episode pm yesterday- of noted chills.  [name] states her mom " is looking pale but we knew we had an appt on Tuesday but then about 5pm she developed chills - temp of 99.7.  Per call to the On Call nurse line- advised to call in AM to be seen unless temp breaks 100.5.  Temperature did not increase.  This RN obtained an appt today for lab and visit - and can do inj as well and cancel appt tomorrow.  [name] states she will arrange transportation - main issue with appt today as a work in with transportation is " it will not be the [place] for her wheelchair so will see how she is doing and able to physically come.  [name] will let this RN know.

## 2022-01-09 ENCOUNTER — Inpatient Hospital Stay: Payer: Medicaid Other | Admitting: Hematology and Oncology

## 2022-01-09 ENCOUNTER — Inpatient Hospital Stay: Payer: Medicaid Other

## 2022-01-09 LAB — FERRITIN: Ferritin: 70 ng/mL (ref 11–307)

## 2022-01-09 LAB — CANCER ANTIGEN 27.29: CA 27.29: 314.8 U/mL — ABNORMAL HIGH (ref 0.0–38.6)

## 2022-01-10 ENCOUNTER — Ambulatory Visit (INDEPENDENT_AMBULATORY_CARE_PROVIDER_SITE_OTHER): Payer: Medicaid Other | Admitting: Infectious Diseases

## 2022-01-10 ENCOUNTER — Encounter: Payer: Self-pay | Admitting: Infectious Diseases

## 2022-01-10 ENCOUNTER — Ambulatory Visit (INDEPENDENT_AMBULATORY_CARE_PROVIDER_SITE_OTHER): Payer: Medicaid Other

## 2022-01-10 ENCOUNTER — Other Ambulatory Visit: Payer: Self-pay

## 2022-01-10 ENCOUNTER — Telehealth: Payer: Self-pay | Admitting: Adult Health

## 2022-01-10 ENCOUNTER — Other Ambulatory Visit (HOSPITAL_COMMUNITY): Payer: Self-pay

## 2022-01-10 VITALS — BP 119/54 | HR 85 | Temp 97.9°F | Ht <= 58 in | Wt 178.0 lb

## 2022-01-10 DIAGNOSIS — L304 Erythema intertrigo: Secondary | ICD-10-CM

## 2022-01-10 DIAGNOSIS — Z7185 Encounter for immunization safety counseling: Secondary | ICD-10-CM

## 2022-01-10 DIAGNOSIS — R7881 Bacteremia: Secondary | ICD-10-CM

## 2022-01-10 DIAGNOSIS — B9689 Other specified bacterial agents as the cause of diseases classified elsewhere: Secondary | ICD-10-CM | POA: Insufficient documentation

## 2022-01-10 DIAGNOSIS — Z23 Encounter for immunization: Secondary | ICD-10-CM | POA: Diagnosis present

## 2022-01-10 DIAGNOSIS — N760 Acute vaginitis: Secondary | ICD-10-CM | POA: Insufficient documentation

## 2022-01-10 LAB — CYTOLOGY - PAP
Comment: NEGATIVE
Diagnosis: NEGATIVE
High risk HPV: NEGATIVE

## 2022-01-10 NOTE — Progress Notes (Addendum)
Patient Active Problem List   Diagnosis Date Noted   Intertrigo 01/10/2022   Bacterial vaginitis 01/10/2022   Sepsis due to cellulitis ([place]) 12/06/2021   B12 deficiency anemia 08/15/2020   Intractable back pain 02/03/2019   Thoracic compression fracture, closed, initial encounter ([place]) 02/03/2019   Essential hypertension 02/03/2019   Genetic testing 01/27/2019   Acute pharyngitis    Bacteremia    FTT (failure to thrive) in adult    Fracture closed, humerus, shaft 01/09/2019   Pneumonia 12/25/2018   Acute respiratory disorder in immunocompromised patient ([place]) 12/25/2018   Family history of breast cancer    Family history of prostate cancer    Family history of melanoma    Family history of colon cancer    Pressure injury of skin 11/24/2018   Chronic respiratory failure with hypoxia and hypercapnia ([place]) 11/19/2018   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   Hypophosphatemia 11/19/2018   Hypocalcemia 11/19/2018   Aspiration pneumonia (HCC) 11/19/2018   SOB (shortness of breath)    Palliative care by specialist    Carcinoma of breast, estrogen receptor positive, stage 4, right ([place]) 11/16/2018   Hypoalbuminemia 11/16/2018   Pleural effusion 11/14/2018   Goals of care, counseling/discussion 11/06/2018   Malignant neoplasm of overlapping sites of right breast in female, estrogen receptor positive ([place]) 10/31/2018   Lung metastasis 10/31/2018   Pain from bone metastases ([place]) 10/31/2018   Morbid obesity with BMI of 40.0-44.9, adult ([place]) 10/31/2018   Bone injury    Pleural effusion, bilateral    Shortness of breath    Abnormal breast finding    Hypokalemia    Breast skin changes    Macrocytic anemia    Malignant neoplasm metastatic to bone (HCC)    Anemia, B12 deficiency 10/26/2018    Patient's Medications  New Prescriptions   No medications on file  Previous Medications   ACETAMINOPHEN (TYLENOL) 500 MG TABLET    Take 1,000 mg by mouth every 6 (six) hours as  needed for mild pain.   ANASTROZOLE (ARIMIDEX) 1 MG TABLET    Take 1 tablet (1 mg total) by mouth daily.   BLOOD PRESSURE MONITOR MISC    Medium sized cuff. Use to check blood pressure twice per day and more often if not feeling well. I10.0   CETIRIZINE (ALLERGY RELIEF CETIRIZINE) 10 MG TABLET    Take 1 tablet (10 mg total) by mouth daily.   FAMOTIDINE (PEPCID) 20 MG TABLET    Take 1 tablet (20 mg total) by mouth 2 (two) times daily as needed for heartburn or indigestion.   FLUCONAZOLE (DIFLUCAN) 200 MG TABLET    '400mg'$  on day 1 then '200mg'$  daily x 7 days   FOLIC ACID (FOLVITE) 1 MG TABLET    Take 1 tablet (1 mg total) by mouth daily.   FUROSEMIDE (LASIX) 20 MG TABLET    Take 1 tablet (20 mg total) by mouth daily.   IBUPROFEN-ACETAMINOPHEN (ADVIL DUAL ACTION) 125-250 MG TABS    Take 2 tablets by mouth daily as needed (pain).   KETOCONAZOLE (NIZORAL) 2 % CREAM    Apply topically daily.   MELATONIN TR 1 MG TBCR    Take 1 mg by mouth at bedtime.   METRONIDAZOLE (FLAGYL) 500 MG TABLET    Take 1 tablet (500 mg total) by mouth 2 (two) times daily for 7 days.   NYSTATIN (MYCOSTATIN/NYSTOP) POWDER    Apply 1 application topically as needed.   OYSTER SHELL CALCIUM PLUS  D 500-5 MG-MCG TABS    TAKE ONE TABLET BY MOUTH EVERY MORNING and TAKE ONE TABLET BY MOUTH AT NOON and TAKE ONE TABLET BY MOUTH EVERY EVENING   PALBOCICLIB (IBRANCE) 75 MG TABLET    TAKE 1 TABLET (75 MG TOTAL) BY MOUTH DAILY. TAKE FOR 21 DAYS ON, 7 DAYS OFF, REPEAT EVERY 28 DAYS   POLYETHYLENE GLYCOL (MIRALAX / GLYCOLAX) 17 G PACKET    Take 17 g by mouth as needed.   SERTRALINE (ZOLOFT) 50 MG TABLET    TAKE 1 AND 1/2 TABLETS BY MOUTH EVERYDAY AT BEDTIME  Modified Medications   No medications on file  Discontinued Medications   No medications on file    Subjective: Here for HFU for Strep agalactiae bacteremia. Accompanied by daughter. She has completed recommended course of amoxicillin without any issues. Denies fevers, chills and sweats.  She however has had redness/itching in the bilateral infra mammary region of bilateral breasts including lower back and bilateral groin. She was given Fluconazole by Oncologist which she has not taken yet as she was told there is a DDI with metronidazole which was prescribed by PCP for bacterial vaginitis which was found out when she had a PAP screening. Denies any complaints including GU symptoms otherwise.   Review of Systems: all systems reviewed with pertinent positives and negatives as listed above    Past Medical History:  Diagnosis Date   Cancer ([place])    Family history of breast cancer    Family history of colon cancer    Family history of melanoma    Family history of prostate cancer    Gallstones    Glaucoma    Mastitis    reports history of recurrent mastitis   Metastatic breast cancer    Obesity    Past Surgical History:  Procedure Laterality Date   GLAUCOMA SURGERY  late 1990's   IR THORACENTESIS ASP PLEURAL SPACE W/IMG GUIDE  10/28/2018   IR THORACENTESIS ASP PLEURAL SPACE W/IMG GUIDE  10/29/2018   OPEN REDUCTION INTERNAL FIXATION (ORIF) DISTAL RADIAL FRACTURE Left 01/09/2019   Procedure: OPEN REDUCTION INTERNAL FIXATION (ORIF) HUMERAL FRACTURE;  Surgeon: Netta Cedars, MD;  Location: [facility];  Service: Orthopedics;  Laterality: Left;    Social History   Tobacco Use   Smoking status: Never   Smokeless tobacco: Never   Tobacco comments:    second hand smoke exposure  Vaping Use   Vaping Use: Never used  Substance Use Topics   Alcohol use: No   Drug use: No    Family History  Problem Relation Age of Onset   Breast cancer Sister        in her 80's-70s   Heart disease Brother        CABG   Heart disease Brother        CABG   Skin cancer Brother        melanoma   Colon cancer Cousin    Heart attack Mother    Heart attack Father    Prostate cancer Brother     Allergies  Allergen Reactions   Ferumoxytol Other (See Comments)    Pt had hypersensitivity to  Ferumoxytol. She had Facial Flushing. See note from 07/14/2021    Health Maintenance  Topic Date Due   TETANUS/TDAP  Never done   Zoster Vaccines- Shingrix (1 of 2) Never done   Fecal DNA (Cologuard)  Never done   COVID-19 Vaccine (4 - Pfizer risk series) 07/08/2020   INFLUENZA VACCINE  02/26/2022 (Originally 12/26/2021)   MAMMOGRAM  03/10/2023   PAP SMEAR-Modifier  01/05/2025   Hepatitis C Screening  Completed   HIV Screening  Completed   HPV VACCINES  Aged Out    Objective:  Vitals:   01/10/22 1442  BP: (!) 119/54  Pulse: 85  Temp: 97.9 F (36.6 C)  TempSrc: Temporal  SpO2: 96%  Weight: 178 lb (80.7 kg)  Height: '4\' 10"'$  (1.473 m)   Body mass index is 37.2 kg/m.  Physical Exam Constitutional:      Appearance: Normal appearance.  HENT:     Head: Normocephalic and atraumatic.      Mouth: Mucous membranes are moist.  Eyes:    Conjunctiva/sclera: Conjunctivae normal.     Pupils: Pupils are equal, round, and reactive to light.   Cardiovascular:     Rate and Rhythm: Normal rate and regular rhythm.     Heart sounds:  Pulmonary:     Effort: Pulmonary effort is normal.     Breath sounds: Normal breath sounds.   Abdominal:     General: Non distended     Palpations: soft.   Musculoskeletal:        General: Normal range of motion.   Skin:    General: scattered areas of erythematous rashes in the infra-mammary region of b/l breasts including lower back and bilateral groins. No signs of cellulitis noted     Comments:  Neurological:     General: grossly non focal     Mental Status: awake, alert and oriented to person, place, and time.   Psychiatric:        Mood and Affect: Mood normal.   Lab Results Lab Results  Component Value Date   WBC 4.9 01/08/2022   HGB 10.0 (L) 01/08/2022   HCT 30.2 (L) 01/08/2022   MCV 106.0 (H) 01/08/2022   PLT 208 01/08/2022    Lab Results  Component Value Date   CREATININE 0.99 01/08/2022   BUN 17 01/08/2022   NA 136  01/08/2022   K 3.5 01/08/2022   CL 103 01/08/2022   CO2 29 01/08/2022    Lab Results  Component Value Date   ALT 9 01/08/2022   AST 17 01/08/2022   ALKPHOS 97 01/08/2022   BILITOT 0.7 01/08/2022    Lab Results  Component Value Date   CHOL 111 10/27/2018   HDL 24 (L) 10/27/2018   LDLCALC 64 10/27/2018   TRIG 120 01/10/2019   CHOLHDL 4.6 10/27/2018   No results found for: "LABRPR", "RPRTITER" No results found for: "HIV1RNAQUANT", "HIV1RNAVL", "CD4TABS"   Problem List Items Addressed This Visit       Musculoskeletal and Integument   Intertrigo     Genitourinary   Bacterial vaginitis     Other   Bacteremia - Primary   Relevant Orders   Blood culture (routine single)   Blood culture (routine single)   Other Visit Diagnoses     Immunization counseling          Assessment/Plan 61 year old female with PMH of Obesity, 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   # Bacterial vaginitis  - Complete 7 days of PO metronidazole - She is OK holding zoloft for 7 days while on Fluconazole and Metronidazole  # Immunization Counseling  - COVID bivalent booster today   I have personally spent 40 minutes involved in face-to-face and non-face-to-face activities for this patient on the day of the visit.   Electronically signed by   Wilber Oliphant, [place] [facility] 01/10/2022, 3:12 PM

## 2022-01-10 NOTE — Progress Notes (Signed)
   Covid-19 Vaccination Clinic  Name:  Suzanne [name]    MRN: [id number] DOB: May 22, 1961  01/10/2022  [name] was observed post Covid-19 immunization for 15 minutes without incident. She was provided with Vaccine Information Sheet and instruction to access the V-Safe system.   [name] was instructed to call 911 with any severe reactions post vaccine: Difficulty breathing  Swelling of face and throat  A fast heartbeat  A bad rash all over body  Dizziness and weakness     Adelfa Koh, CMA

## 2022-01-10 NOTE — Telephone Encounter (Signed)
Scheduled appointment per 8/14 los. Patient is aware.

## 2022-01-12 ENCOUNTER — Other Ambulatory Visit: Payer: Self-pay | Admitting: *Deleted

## 2022-01-12 DIAGNOSIS — C50811 Malignant neoplasm of overlapping sites of right female breast: Secondary | ICD-10-CM | POA: Diagnosis not present

## 2022-01-12 NOTE — Patient Outreach (Signed)
Medicaid Managed Care   Nurse Care Manager Note  01/12/2022 Name:  Suzanne [name] MRN:  [id number] DOB:  [birth year]  Suzanne [name] is an 61 y.o. year old female who is a primary patient of Suzanne Pounds, NP.  The [facility] Managed Care Coordination team was consulted for assistance with:    Health Maintenance  Suzanne [name] was given information about Medicaid Managed Care Coordination team services today. Suzanne [name] Patient agreed to services and verbal consent obtained.  Engaged with patient by telephone for follow up visit in response to provider referral for case management and/or care coordination services.   Assessments/Interventions:  Review of past medical history, allergies, medications, health status, including review of consultants reports, laboratory and other test data, was performed as part of comprehensive evaluation and provision of chronic care management services.  SDOH (Social Determinants of Health) assessments and interventions performed: SDOH Interventions    Flowsheet Row Most Recent Value  SDOH Interventions   Housing Interventions Intervention Not Indicated  Transportation Interventions Intervention Not Indicated       Care Plan  Allergies  Allergen Reactions   Ferumoxytol Other (See Comments)    Pt had hypersensitivity to Ferumoxytol. She had Facial Flushing. See note from 07/14/2021    Medications Reviewed Today     Reviewed by Suzanne Montane, RN (Registered Nurse) on 01/12/22 at Suzanne [name] List Status: <None>   Medication Order Taking? Sig Documenting Provider Last Dose Status Informant  acetaminophen (TYLENOL) 500 MG tablet [id number] No Take 1,000 mg by mouth every 6 (six) hours as needed for mild pain. Provider, Historical, [name] Taking Active Self  anastrozole (ARIMIDEX) 1 MG tablet [id number] No Take 1 tablet (1 mg total) by mouth daily. Suzanne Pike, [name] Taking Active Self  Blood Pressure Monitor MISC [id number] No Medium sized cuff. Use to  check blood pressure twice per day and more often if not feeling well. I10.0 Suzanne Pounds, NP Taking Active Self  cetirizine (ALLERGY RELIEF CETIRIZINE) 10 MG tablet [id number] No Take 1 tablet (10 mg total) by mouth daily. Suzanne Pounds, NP Taking Expired 01/08/22 2359 Self  famotidine (PEPCID) 20 MG tablet [id number] No Take 1 tablet (20 mg total) by mouth 2 (two) times daily as needed for heartburn or indigestion. Suzanne [name] Taking Active Self  fluconazole (DIFLUCAN) 200 MG tablet [id number] No 469m on day 1 then 2013mdaily x 7 days CaGardenia PhlegmNP Taking Active   folic acid (FOLVITE) 1 MG tablet [id number] Take 1 tablet (1 mg total) by mouth daily. IrBenay PikeMD Taking Active Self  furosemide (LASIX) 20 MG tablet [id number] Take 1 tablet (20 mg total) by mouth daily. IrBenay PikeMD Taking Active Self  Ibuprofen-Acetaminophen (ADVIL DUAL ACTION) 125-250 MG TABS [id number] Take 2 tablets by mouth daily as needed (pain). Provider, Historical, [name] Taking Active Self  ketoconazole (NIZORAL) 2 % cream [id number] Apply topically daily. MaGeorgette ShellMD Taking Active   MELATONIN TR 1 MG TBCR [id number] Take 1 mg by mouth at bedtime. Provider, Historical, [name] Taking Active Self  metroNIDAZOLE (FLAGYL) 500 MG tablet [id number] Take 1 tablet (500 mg total) by mouth 2 (two) times daily for 7 days. Suzanne PoundsNP Taking Active   nystatin (MYCOSTATIN/NYSTOP) powder [id number] Apply 1 application topically as needed.  Patient taking differently: Apply 1 application  topically daily as needed (skin irritation).   CaGardenia PhlegmNP Taking Active  Self  OYSTER [name] CALCIUM PLUS D 500-5 MG-MCG TABS [id number] No TAKE ONE TABLET BY MOUTH EVERY MORNING and TAKE ONE TABLET BY MOUTH AT NOON and TAKE ONE TABLET BY MOUTH EVERY EVENING  Patient taking differently: Take 1 tablet by mouth 2 (two) times daily.   Suzanne [name] Taking Active  Self           Med Note (Suzanne [name] A   Mon Jun 05, 2021  1:21 PM) Taking twice daily  palbociclib (IBRANCE) 75 MG tablet [id number] No TAKE 1 TABLET (75 MG TOTAL) BY MOUTH DAILY. TAKE FOR 21 DAYS ON, 7 DAYS OFF, REPEAT EVERY 28 DAYS Suzanne [name] Taking Active Self           Med Note Suzanne [name]   Wed Dec 06, 2021  4:07 PM) Started on 12-03-21 for the 3 weeks  polyethylene glycol (MIRALAX / GLYCOLAX) 17 g packet [id number] No Take 17 g by mouth as needed.  Patient taking differently: Take 17 g by mouth daily as needed for mild constipation.   Suzanne [name] Taking Active Self  sertraline (ZOLOFT) 50 MG tablet [id number] No TAKE 1 AND 1/2 TABLETS BY MOUTH EVERYDAY AT BEDTIME Suzanne [name] Taking Active   Med List Note Suzanne [name] 11/13/19 5597): Ibrance filled at [facility]            Patient Active Problem List   Diagnosis Date Noted   Intertrigo 01/10/2022   Bacterial vaginitis 01/10/2022   Sepsis due to cellulitis ([place]) 12/06/2021   B12 deficiency anemia 08/15/2020   Intractable back pain 02/03/2019   Thoracic compression fracture, closed, initial encounter ([place]) 02/03/2019   Essential hypertension 02/03/2019   Genetic testing 01/27/2019   Acute pharyngitis    Bacteremia    FTT (failure to thrive) in adult    Fracture closed, humerus, shaft 01/09/2019   Pneumonia 12/25/2018   Acute respiratory disorder in immunocompromised patient ([place]) 12/25/2018   Family history of breast cancer    Family history of prostate cancer    Family history of melanoma    Family history of colon cancer    Pressure injury of skin 11/24/2018   Chronic respiratory failure with hypoxia and hypercapnia ([place]) 11/19/2018   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   Hypophosphatemia 11/19/2018   Hypocalcemia 11/19/2018   Aspiration pneumonia ([place]) 11/19/2018   SOB (shortness of breath)    Palliative care by specialist    Carcinoma of breast,  estrogen receptor positive, stage 4, right ([place]) 11/16/2018   Hypoalbuminemia 11/16/2018   Pleural effusion 11/14/2018   Goals of care, counseling/discussion 11/06/2018   Malignant neoplasm of overlapping sites of right breast in female, estrogen receptor positive ([place]) 10/31/2018   Lung metastasis 10/31/2018   Pain from bone metastases ([place]) 10/31/2018   Morbid obesity with BMI of 40.0-44.9, adult ([place]) 10/31/2018   Bone injury    Pleural effusion, bilateral    Shortness of breath    Abnormal breast finding    Hypokalemia    Breast skin changes    Macrocytic anemia    Malignant neoplasm metastatic to bone (HCC)    Anemia, B12 deficiency 10/26/2018    Conditions to be addressed/monitored per PCP order:   Health Maintenance  Care Plan : RN Care Manager Plan of Care  Updates made by Suzanne Montane, RN since 01/12/2022 12:00 AM     Problem: Health Management Coordination Needs      Long-Range Goal:  Development of Plan of Care for needs related to Health Management Completed 01/12/2022  Start Date: 01/26/2021  Expected End Date: 01/25/2022  Priority: High  Note:   Current Barriers:  Chronic Disease Management support and education needs related to Health Maintenance-Ms. Ogburn reports doing better. She has all of her medications and continues to receive PCS. Ms. Scobey denies any needs at this time.  RNCM Clinical Goal(s):  Patient will verbalize understanding of plan for management of Health Maintenance attend all scheduled medical appointments: 01/22/22 and 02/06/22 with Oncology continue to work with RN Care Manager to address care management and care coordination needs related to Health Maintenance  through collaboration with RN Care manager, provider, and care team.   Interventions: Inter-disciplinary care team collaboration (see longitudinal plan of care) Evaluation of current treatment plan related to  self management and patient's adherence to plan as established by  provider RNCM will follow up with patient in 60 days to assess for barriers to managing her health   Health Maintenance  (Status: Goal Met.) Evaluation of current treatment plan related to  Health Maintenance ,  self-management and patient's adherence to plan as established by provider. Discussed plans with patient for ongoing care management follow up and provided patient with direct contact information for care management team Reviewed scheduled/upcoming provider appointments including Iron infusion 12/19/21 with Oncology and 01/05/22 with PCP; Discussed plans with patient for ongoing care management follow up and provided patient with direct contact information for care management team; SDOH assessment  Patient Goals/Self-Care Activities: Patient will self administer medications as prescribed Patient will attend all scheduled provider appointments Patient will call pharmacy for medication refills Patient will call provider office for new concerns or questions Patient will work with BSW to address care coordination needs and will continue to work with the clinical team to address health care and disease management related needs.         Follow Up:  Patient agrees to Care Plan and Follow-up.  Plan: The Managed Medicaid care management team will reach out to the patient again over the next 60 days.  Date/time of next scheduled RN care management/care coordination outreach:  03/13/22 @ 2:30pm  [name] RN, BSN [place] RN Care Coordinator

## 2022-01-12 NOTE — Patient Instructions (Signed)
Visit Information  Ms. Canny was given information about Medicaid Managed Care team care coordination services as a part of their [place] Medicaid benefit. [name] verbally consented to engagement with the [facility] Managed Care team.   If you are experiencing a medical emergency, please call 911 or report to your local emergency department or urgent care.   If you have a non-emergency medical problem during routine business hours, please contact your provider's office and ask to speak with a nurse.   For questions related to your [facility], please call: [phone] or visit the homepage here: [email or web address]  If you would like to schedule transportation through your [facility], please call the following number at least 2 days in advance of your appointment: [phone]   Rides for urgent appointments can also be made after hours by calling Member Services.  Call the [place] at [phone], at any time, 24 hours a day, 7 days a week. If you are in danger or need immediate medical attention call 911.  If you would like help to quit smoking, call 1-800-QUIT-NOW (478)886-3463) OR Espaol: 1-855-Djelo-Ya (5-945-859-2924) o para ms informacin haga clic aqu or Text READY to 200-400 to register via text  Ms. Herbert Deaner,   Please see education materials related to preventive health provided by MyChart link.  Patient verbalizes understanding of instructions and care plan provided today and agrees to view in [place]. Active MyChart status and patient understanding of how to access instructions and care plan via MyChart confirmed with patient.     Telephone follow up appointment with Managed Medicaid care management team member scheduled for:03/13/22 @ 2:30pm  [name] RN, BSN [place] RN Care  Coordinator   Following is a copy of your plan of care:  Care Plan : [place] of Care  Updates made by Melissa Montane, RN since 01/12/2022 12:00 AM     Problem: Health Management Coordination Needs      Long-Range Goal: Development of Plan of Care for needs related to Health Management Completed 01/12/2022  Start Date: 01/26/2021  Expected End Date: 01/25/2022  Priority: High  Note:   Current Barriers:  Chronic Disease Management support and education needs related to Health Maintenance-Ms. Peddy reports doing better. She has all of her medications and continues to receive PCS. Ms. Matsen denies any needs at this time.  RNCM Clinical Goal(s):  Patient will verbalize understanding of plan for management of Health Maintenance attend all scheduled medical appointments: 01/22/22 and 02/06/22 with Oncology continue to work with RN Care Manager to address care management and care coordination needs related to Health Maintenance  through collaboration with RN Care manager, provider, and care team.   Interventions: Inter-disciplinary care team collaboration (see longitudinal plan of care) Evaluation of current treatment plan related to  self management and patient's adherence to plan as established by provider RNCM will follow up with patient in 60 days to assess for barriers to managing her health   Health Maintenance  (Status: Goal Met.) Evaluation of current treatment plan related to  Health Maintenance ,  self-management and patient's adherence to plan as established by provider. Discussed plans with patient for ongoing care management follow up and provided patient with direct contact information for care management team Reviewed scheduled/upcoming provider appointments including Iron infusion 12/19/21 with Oncology and 01/05/22 with PCP; Discussed plans with patient for ongoing care management follow up  and provided patient with direct contact information for care management  team; SDOH assessment  Patient Goals/Self-Care Activities: Patient will self administer medications as prescribed Patient will attend all scheduled provider appointments Patient will call pharmacy for medication refills Patient will call provider office for new concerns or questions Patient will work with BSW to address care coordination needs and will continue to work with the clinical team to address health care and disease management related needs.

## 2022-01-15 DIAGNOSIS — C50811 Malignant neoplasm of overlapping sites of right female breast: Secondary | ICD-10-CM | POA: Diagnosis not present

## 2022-01-16 DIAGNOSIS — C50811 Malignant neoplasm of overlapping sites of right female breast: Secondary | ICD-10-CM | POA: Diagnosis not present

## 2022-01-16 LAB — CULTURE, BLOOD (SINGLE)
MICRO NUMBER:: 13789177
MICRO NUMBER:: 13789176
Result:: NO GROWTH
Result:: NO GROWTH
SPECIMEN QUALITY:: ADEQUATE
SPECIMEN QUALITY:: ADEQUATE

## 2022-01-17 DIAGNOSIS — C50811 Malignant neoplasm of overlapping sites of right female breast: Secondary | ICD-10-CM | POA: Diagnosis not present

## 2022-01-18 ENCOUNTER — Other Ambulatory Visit (HOSPITAL_COMMUNITY): Payer: Self-pay

## 2022-01-18 DIAGNOSIS — C50811 Malignant neoplasm of overlapping sites of right female breast: Secondary | ICD-10-CM | POA: Diagnosis not present

## 2022-01-19 ENCOUNTER — Telehealth: Payer: Self-pay | Admitting: *Deleted

## 2022-01-19 ENCOUNTER — Other Ambulatory Visit: Payer: Self-pay | Admitting: *Deleted

## 2022-01-19 DIAGNOSIS — C7951 Secondary malignant neoplasm of bone: Secondary | ICD-10-CM

## 2022-01-19 DIAGNOSIS — C50811 Malignant neoplasm of overlapping sites of right female breast: Secondary | ICD-10-CM | POA: Diagnosis not present

## 2022-01-19 DIAGNOSIS — C50911 Malignant neoplasm of unspecified site of right female breast: Secondary | ICD-10-CM

## 2022-01-19 DIAGNOSIS — D518 Other vitamin B12 deficiency anemias: Secondary | ICD-10-CM

## 2022-01-19 NOTE — Telephone Encounter (Signed)
This RN spoke with [name] who wanted to give report on changes for appt on Monday with lab and MD.  [name] states the infectious disease doctor changed some of the dosing her her meds for bacterial vaginosis as well as yeast and had the pt hold the melatonin while taking.  She states pt was increased sleeping yesterday and today.  She also notes when pt eats " anything - after a couple of bites her stomach hurts" not nausea just discomfort.  She does not believe the patient is taking her pepcid as previously.  Per discussion pt will resume the pepcid 1 tab bid.  [name] will monitor pt over the weekend for changes in above and understands how to call if needed.  This RN added labs due to standing labs are not within date due to delays in pt's schedule.

## 2022-01-22 ENCOUNTER — Inpatient Hospital Stay: Payer: Medicaid Other

## 2022-01-22 ENCOUNTER — Telehealth: Payer: Self-pay | Admitting: Hematology and Oncology

## 2022-01-22 ENCOUNTER — Inpatient Hospital Stay: Payer: Medicaid Other | Admitting: Hematology and Oncology

## 2022-01-22 NOTE — Telephone Encounter (Signed)
Contacted patient to scheduled appointments. Patient is aware of appointments that are scheduled.   

## 2022-01-23 ENCOUNTER — Other Ambulatory Visit: Payer: Self-pay

## 2022-01-23 ENCOUNTER — Inpatient Hospital Stay: Payer: Medicaid Other

## 2022-01-23 ENCOUNTER — Other Ambulatory Visit (HOSPITAL_COMMUNITY): Payer: Self-pay

## 2022-01-23 ENCOUNTER — Encounter: Payer: Self-pay | Admitting: *Deleted

## 2022-01-23 ENCOUNTER — Telehealth: Payer: Self-pay | Admitting: Hematology and Oncology

## 2022-01-23 ENCOUNTER — Inpatient Hospital Stay (HOSPITAL_BASED_OUTPATIENT_CLINIC_OR_DEPARTMENT_OTHER): Payer: Medicaid Other | Admitting: Physician Assistant

## 2022-01-23 VITALS — BP 119/79 | HR 95 | Temp 97.5°F | Resp 16 | Wt 180.4 lb

## 2022-01-23 DIAGNOSIS — C7951 Secondary malignant neoplasm of bone: Secondary | ICD-10-CM

## 2022-01-23 DIAGNOSIS — D539 Nutritional anemia, unspecified: Secondary | ICD-10-CM | POA: Diagnosis not present

## 2022-01-23 DIAGNOSIS — C50811 Malignant neoplasm of overlapping sites of right female breast: Secondary | ICD-10-CM | POA: Diagnosis not present

## 2022-01-23 DIAGNOSIS — D518 Other vitamin B12 deficiency anemias: Secondary | ICD-10-CM

## 2022-01-23 DIAGNOSIS — L304 Erythema intertrigo: Secondary | ICD-10-CM | POA: Diagnosis not present

## 2022-01-23 DIAGNOSIS — C50911 Malignant neoplasm of unspecified site of right female breast: Secondary | ICD-10-CM

## 2022-01-23 LAB — FERRITIN: Ferritin: 33 ng/mL (ref 11–307)

## 2022-01-23 LAB — CBC WITH DIFFERENTIAL (CANCER CENTER ONLY)
Abs Immature Granulocytes: 0.01 10*3/uL (ref 0.00–0.07)
Basophils Absolute: 0.1 10*3/uL (ref 0.0–0.1)
Basophils Relative: 2 %
Eosinophils Absolute: 0.1 10*3/uL (ref 0.0–0.5)
Eosinophils Relative: 3 %
HCT: 29.8 % — ABNORMAL LOW (ref 36.0–46.0)
Hemoglobin: 9.4 g/dL — ABNORMAL LOW (ref 12.0–15.0)
Immature Granulocytes: 0 %
Lymphocytes Relative: 19 %
Lymphs Abs: 0.7 10*3/uL (ref 0.7–4.0)
MCH: 32.9 pg (ref 26.0–34.0)
MCHC: 31.5 g/dL (ref 30.0–36.0)
MCV: 104.2 fL — ABNORMAL HIGH (ref 80.0–100.0)
Monocytes Absolute: 0.3 10*3/uL (ref 0.1–1.0)
Monocytes Relative: 9 %
Neutro Abs: 2.4 10*3/uL (ref 1.7–7.7)
Neutrophils Relative %: 67 %
Platelet Count: 289 10*3/uL (ref 150–400)
RBC: 2.86 MIL/uL — ABNORMAL LOW (ref 3.87–5.11)
RDW: 14.8 % (ref 11.5–15.5)
WBC Count: 3.6 10*3/uL — ABNORMAL LOW (ref 4.0–10.5)
nRBC: 0.6 % — ABNORMAL HIGH (ref 0.0–0.2)

## 2022-01-23 LAB — CMP (CANCER CENTER ONLY)
ALT: 22 U/L (ref 0–44)
AST: 23 U/L (ref 15–41)
Albumin: 3.7 g/dL (ref 3.5–5.0)
Alkaline Phosphatase: 95 U/L (ref 38–126)
Anion gap: 4 — ABNORMAL LOW (ref 5–15)
BUN: 13 mg/dL (ref 8–23)
CO2: 32 mmol/L (ref 22–32)
Calcium: 8.9 mg/dL (ref 8.9–10.3)
Chloride: 105 mmol/L (ref 98–111)
Creatinine: 0.65 mg/dL (ref 0.44–1.00)
GFR, Estimated: >60 mL/min (ref >60)
Glucose, Bld: 112 mg/dL — ABNORMAL HIGH (ref 70–99)
Potassium: 3.5 mmol/L (ref 3.5–5.1)
Sodium: 141 mmol/L (ref 135–145)
Total Bilirubin: 0.5 mg/dL (ref 0.3–1.2)
Total Protein: 6.7 g/dL (ref 6.5–8.1)

## 2022-01-23 LAB — SAMPLE TO BLOOD BANK

## 2022-01-23 MED ORDER — DENOSUMAB 120 MG/1.7ML ~~LOC~~ SOLN
120.0000 mg | Freq: Once | SUBCUTANEOUS | Status: AC
  Administered 2022-01-23: 120 mg via SUBCUTANEOUS
  Filled 2022-01-23: qty 1.7

## 2022-01-23 NOTE — Telephone Encounter (Signed)
Per 8/29 los called and spoke to pt about appointment

## 2022-01-24 ENCOUNTER — Other Ambulatory Visit (HOSPITAL_COMMUNITY): Payer: Self-pay

## 2022-01-24 DIAGNOSIS — C50811 Malignant neoplasm of overlapping sites of right female breast: Secondary | ICD-10-CM | POA: Diagnosis not present

## 2022-01-25 ENCOUNTER — Encounter: Payer: Self-pay | Admitting: Hematology and Oncology

## 2022-01-25 DIAGNOSIS — C50811 Malignant neoplasm of overlapping sites of right female breast: Secondary | ICD-10-CM | POA: Diagnosis not present

## 2022-01-25 NOTE — Progress Notes (Signed)
[facility] Follow up:    Suzanne Pounds, NP [address] [place] [address]   DIAGNOSIS: Metastatic breast cancer  SUMMARY OF ONCOLOGIC HISTORY: Suzanne [name], [place] woman presenting 10/26/2018 with right-sided inflammatory breast cancer, stage IV, involving lungs, lymph nodes and bones, as follows:             (a) chest CT scan 10/26/2018 shows bilateral pleural effusions, possible lymphangitic spread of tumor, diffuse bony metastatic disease, and significant axillary mediastinal and hilar adenopathy             (b) bone scan 10/27/2018 is a "near SuperScan" consistent with widespread bony metastatic disease             (c) head CT with and without contrast 10/30/2018 shows no intracranial metastatic disease, multiple calvarial lesions             (d) CA-27-29 on 10/27/2018 was 1810.4   (1) pleural fluid from right thoracentesis 10/28/2018 confirms malignant cells consistent with a breast primary, strongly estrogen and progesterone receptor positive, HER-2 not amplified, with an MIB-1 of 2%   (2) anastrozole started 10/29/2018             (a) palbociclib started 11/13/2018 at 100 mg dose             (b) denosumab/xgeva started 11/13/2018              (C) palbociclib dose decreased to 23m             (D) Palbociclib dose decreased to 75 mg every other day in 11/2021   (3) associated problems:             (a) hypoxia secondary to effusions             (b) pain from bone lesions and left humeral and vertebral compression fractures             (c) right upper extremity lymphedema             (d) poor venous access   (4) genetics testing on 12/25/2018 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 STK11, TP53, TSC1, TSC2, and VHL.  The following genes were evaluated for sequence changes only: SDHA and HOXB13 c.251G>A variant only. The [facility] Melanoma Panel analyzed the following 9 genes: BAP1 BRCA2 CDK4 CDKN2A MITF POT1 PTEN RB1 Tp53.    (5) palliative radiation to the left humerus 07/20/2019 - 07/31/2019 Site Technique Total Dose (Gy) Dose per Fx (Gy) Completed Fx Beam Energies  Humerus, Left: Ext_Lt_humerus Complex 30/30 3 10/10 6X    (6) anemia requiring transfusion:              (a) B-12 level 134 FEB 2022: B-12 injectons started              (B) iron deficient, IV iron to start 2/6     CURRENT THERAPY: Anastrozole, IBrock Bad(holding Ibrance 01/08/2022)  INTERVAL HISTORY: Suzanne [name]  61 y.o. female returns for a follow up. She is accompanied by her daughter for this visit.  She was last seen by Suzanne Bihari NP on 01/08/2022. Since then she was evaluated by ID for intertrigo and bacterial vaginitis.   Suzanne [name] reports that she took fluconazole 200 mg q 3 days x 3 doses which greatly improved her fungal infection. She has noticed in the last few days, the rash has returned in her infra-mammary region. Her fatigue is persistent and she sleeps for long periods at a time. She reports occasional nausea without vomiting episode. She denies any bowel habits changes including recurrent episodes of diarrhea or constipation. She denies easy bruising or signs of bleeding. She continues to use 1.5 L of supplemental oxygen. She denies fevers, chills, night sweats, chest pain or cough.    Patient Active Problem List   Diagnosis Date Noted   Intertrigo 01/10/2022   Bacterial vaginitis 01/10/2022   Sepsis due  to cellulitis ([place]) 12/06/2021   B12 deficiency anemia 08/15/2020   Intractable back pain 02/03/2019   Thoracic compression fracture, closed, initial encounter ([place]) 02/03/2019   Essential hypertension 02/03/2019   Genetic testing 01/27/2019   Acute pharyngitis    Bacteremia    FTT (failure to thrive) in adult    Fracture closed, humerus, shaft 01/09/2019   Pneumonia 12/25/2018   Acute respiratory disorder in immunocompromised patient ([place]) 12/25/2018   Family history of breast cancer    Family history of prostate cancer    Family history of melanoma    Family history of colon cancer    Pressure injury of skin 11/24/2018   Chronic respiratory failure with hypoxia and hypercapnia ([place]) 11/19/2018   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   Hypophosphatemia 11/19/2018   Hypocalcemia 11/19/2018   Aspiration pneumonia (HCC) 11/19/2018   SOB (shortness of breath)    Palliative care by specialist    Carcinoma of breast, estrogen receptor positive, stage 4, right ([place]) 11/16/2018   Hypoalbuminemia 11/16/2018   Pleural effusion 11/14/2018   Goals of care, counseling/discussion 11/06/2018   Malignant neoplasm of overlapping sites of right breast in female, estrogen receptor positive ([place]) 10/31/2018   Lung metastasis 10/31/2018   Pain from bone metastases ([place]) 10/31/2018   Morbid obesity with BMI of 40.0-44.9, adult ([place]) 10/31/2018   Bone injury    Pleural effusion, bilateral    Shortness of breath    Abnormal breast finding    Hypokalemia    Breast skin changes    Macrocytic anemia    Malignant neoplasm metastatic to bone (HCC)    Anemia, B12 deficiency 10/26/2018    is allergic to ferumoxytol.  MEDICAL HISTORY: Past Medical History:  Diagnosis Date   Cancer [facility])    Family history of breast cancer    Family history of colon cancer    Family history of melanoma    Family history of prostate cancer    Gallstones    Glaucoma    Mastitis    reports history of recurrent  mastitis   Metastatic breast cancer    Obesity     SURGICAL HISTORY: Past Surgical History:  Procedure Laterality Date   GLAUCOMA SURGERY  late 1990's   IR THORACENTESIS ASP PLEURAL SPACE W/IMG GUIDE  10/28/2018   IR THORACENTESIS ASP PLEURAL SPACE W/IMG GUIDE  10/29/2018   OPEN REDUCTION INTERNAL FIXATION (ORIF) DISTAL RADIAL FRACTURE Left 01/09/2019   Procedure: OPEN REDUCTION INTERNAL FIXATION (ORIF) HUMERAL FRACTURE;  Surgeon: Netta Cedars, MD;  Location: [facility];  Service: Orthopedics;  Laterality: Left;    SOCIAL HISTORY: Social History   Socioeconomic History   Marital status: Widowed    Spouse name: Not on file   Number of children: 1   Years of education: Not on file   Highest education level: Not on file  Occupational History   Occupation: Clinical research associate Programs    Comment: Self-employed  Tobacco Use   Smoking status: Never   Smokeless tobacco: Never   Tobacco comments:    second hand smoke exposure  Vaping Use   Vaping Use: Never used  Substance and Sexual Activity   Alcohol use: No   Drug use: No   Sexual activity: Not Currently  Other Topics Concern   Not on file  Social History Narrative   Has a daughter named [name]. Daughter has CP.   Social Determinants of Health   Financial Resource Strain: Low Risk  (08/16/2021)   Overall Financial Resource Strain (CARDIA)    Difficulty of Paying Living Expenses: Not hard at all  Food Insecurity: Food Insecurity Present (11/08/2021)   Hunger Vital Sign    Worried About Running Out of Food in the Last Year: Sometimes true    Ran Out of Food in the Last Year: Sometimes true  Transportation Needs: No Transportation Needs (01/12/2022)   PRAPARE - Hydrologist (Medical): No    Lack of Transportation (Non-Medical): No  Physical Activity: Not on file  Stress: Stress Concern Present (11/08/2021)   [place]    Feeling of  Stress : To some extent  Social Connections: Socially Isolated (03/27/2021)   Social Connection and Isolation Panel [NHANES]    Frequency of Communication with Friends and Family: Once a week    Frequency of Social Gatherings with Friends and Family: More than three times a week    Attends Religious Services: Never    Marine scientist or Organizations: No    Attends Archivist Meetings: Never    Marital Status: Widowed  Intimate Partner Violence: Not At Risk (12/24/2018)   Humiliation, Afraid, Rape, and Kick questionnaire    Fear of Current or Ex-Partner: No    Emotionally Abused: No    Physically Abused: No    Sexually Abused: No    FAMILY HISTORY: Family History  Problem Relation Age of Onset   Breast cancer Sister        in her 28's-70s   Heart disease Brother        CABG   Heart disease Brother        CABG   Skin cancer Brother        melanoma   Colon cancer Cousin    Heart attack Mother    Heart attack Father    Prostate cancer Brother     Review of Systems  Constitutional:  Positive for fatigue. Negative for appetite change, chills, fever and unexpected weight change.  HENT:   Negative for hearing loss, lump/mass and trouble swallowing.   Eyes:  Negative for eye problems and icterus.  Respiratory:  Negative for chest tightness, cough and shortness of breath.   Cardiovascular:  Negative for chest pain, leg swelling and palpitations.  Gastrointestinal:  Positive for nausea. Negative for abdominal distention, abdominal pain, constipation, diarrhea and vomiting.  Endocrine: Negative for hot flashes.  Genitourinary:  Negative for difficulty urinating.   Musculoskeletal:  Negative for arthralgias.  Skin:  Positive for rash. Negative for  itching.  Neurological:  Negative for dizziness, extremity weakness, headaches and numbness.  Hematological:  Negative for adenopathy. Does not bruise/bleed easily.  Psychiatric/Behavioral:  Negative for depression. The  patient is not nervous/anxious.       PHYSICAL EXAMINATION  ECOG PERFORMANCE STATUS: 2 - Symptomatic, <50% confined to bed  Vitals:   01/23/22 1056  BP: 119/79  Pulse: 95  Resp: 16  Temp: (!) 97.5 F (36.4 C)  SpO2: 97%    Physical Exam Constitutional:      General: She is not in acute distress.    Appearance: Normal appearance. She is not toxic-appearing.     Comments: Examined in wheelchair  HENT:     Head: Normocephalic and atraumatic.  Eyes:     General: No scleral icterus. Cardiovascular:     Rate and Rhythm: Normal rate and regular rhythm.     Pulses: Normal pulses.     Heart sounds: Normal heart sounds.  Pulmonary:     Effort: Pulmonary effort is normal.     Breath sounds: Normal breath sounds.  Musculoskeletal:        General: No swelling.     Cervical back: Neck supple.  Lymphadenopathy:     Cervical: No cervical adenopathy.  Skin:    General: Skin is warm and dry.     Findings: Rash (erythematous rash in inframammary region, bilaterally.) present.  Neurological:     General: No focal deficit present.     Mental Status: She is alert.  Psychiatric:        Mood and Affect: Mood normal.        Behavior: Behavior normal.     LABORATORY DATA:  CBC    Component Value Date/Time   WBC 3.6 (L) 01/23/2022 1036   WBC 2.0 (L) 12/10/2021 0326   RBC 2.86 (L) 01/23/2022 1036   HGB 9.4 (L) 01/23/2022 1036   HCT 29.8 (L) 01/23/2022 1036   PLT 289 01/23/2022 1036   MCV 104.2 (H) 01/23/2022 1036   MCH 32.9 01/23/2022 1036   MCHC 31.5 01/23/2022 1036   RDW 14.8 01/23/2022 1036   LYMPHSABS 0.7 01/23/2022 1036   MONOABS 0.3 01/23/2022 1036   EOSABS 0.1 01/23/2022 1036   BASOSABS 0.1 01/23/2022 1036    CMP     Component Value Date/Time   NA 141 01/23/2022 1036   K 3.5 01/23/2022 1036   CL 105 01/23/2022 1036   CO2 32 01/23/2022 1036   GLUCOSE 112 (H) 01/23/2022 1036   BUN 13 01/23/2022 1036   CREATININE 0.65 01/23/2022 1036   CALCIUM 8.9 01/23/2022  1036   PROT 6.7 01/23/2022 1036   ALBUMIN 3.7 01/23/2022 1036   AST 23 01/23/2022 1036   ALT 22 01/23/2022 1036   ALKPHOS 95 01/23/2022 1036   BILITOT 0.5 01/23/2022 1036   GFRNONAA >60 01/23/2022 1036   GFRAA >60 02/24/2020 1454   GFRAA >60 07/24/2019 1453      ASSESSMENT and THERAPY PLAN:  Suzanne [name] is a 61 y.o. female who presents to the clinic for follow up for metastatic breast cancer to the bone.   #Metastatic breast cancer involving the bone: --Current treatment includes anastrozole, xgeva and ibrance. Suzanne [name] was held starting 01/08/2022 due to ongoing infections.  --Labs today were reviewed and require no intervention. WBC 3.6, Hgb 9.4, MCV 104.2, Plt 289.  --Continue on anastrozole and xgeva therapy, due for next xgeva injection today. Continue to hold Ibrance due to persistent fungal infection.  --RTC in 4  weeks for labs and f/u visit with Dr. Chryl Heck.   #Intertrigo: --Completed fluconazole 200 mg q 3 days x 3 doses with improvement --Due to recurrence involving the infra-mammary region, recommend to repeat fluconazole course as above as recommended by ID.   # Streptococcus agalactiae bacteremia --Completed appropriate IV and PO antibiotic treatment  --TTE 12/08/21 negative for vegetations, endocarditis  --Surveillance blood cultures from 01/10/2022 were negative.   #Macrocytic anemia: --vitamin B12 level from 01/08/2022 was normal. No need for B12 supplementation at this time.  --currently on folic acid 1 mg daily --ferritin level today has decreased to 33. Monitor closely to see if iron supplementation is needed in the near future.    No problem-specific Assessment & Plan notes found for this encounter.   All questions were answered. The patient knows to call the clinic with any problems, questions or concerns. We can certainly see the patient much sooner if necessary.  I have spent a total of 30 minutes minutes of face-to-face and non-face-to-face time,  preparing to see the Shafer a medically appropriate examination, counseling and educating the patient, documenting clinical information in the electronic health record, and care coordination.    Dede Query PA-C Dept of Hematology and [place] [facility] Phone: [phone]

## 2022-01-26 DIAGNOSIS — R0602 Shortness of breath: Secondary | ICD-10-CM | POA: Diagnosis not present

## 2022-01-26 DIAGNOSIS — C50811 Malignant neoplasm of overlapping sites of right female breast: Secondary | ICD-10-CM | POA: Diagnosis not present

## 2022-01-26 DIAGNOSIS — J9621 Acute and chronic respiratory failure with hypoxia: Secondary | ICD-10-CM | POA: Diagnosis not present

## 2022-01-26 DIAGNOSIS — S22000A Wedge compression fracture of unspecified thoracic vertebra, initial encounter for closed fracture: Secondary | ICD-10-CM | POA: Diagnosis not present

## 2022-01-26 DIAGNOSIS — C7801 Secondary malignant neoplasm of right lung: Secondary | ICD-10-CM | POA: Diagnosis not present

## 2022-01-30 DIAGNOSIS — C50811 Malignant neoplasm of overlapping sites of right female breast: Secondary | ICD-10-CM | POA: Diagnosis not present

## 2022-01-31 DIAGNOSIS — C50811 Malignant neoplasm of overlapping sites of right female breast: Secondary | ICD-10-CM | POA: Diagnosis not present

## 2022-02-01 DIAGNOSIS — C50811 Malignant neoplasm of overlapping sites of right female breast: Secondary | ICD-10-CM | POA: Diagnosis not present

## 2022-02-02 DIAGNOSIS — C50811 Malignant neoplasm of overlapping sites of right female breast: Secondary | ICD-10-CM | POA: Diagnosis not present

## 2022-02-05 DIAGNOSIS — C50811 Malignant neoplasm of overlapping sites of right female breast: Secondary | ICD-10-CM | POA: Diagnosis not present

## 2022-02-06 ENCOUNTER — Inpatient Hospital Stay: Payer: Medicaid Other

## 2022-02-06 ENCOUNTER — Inpatient Hospital Stay: Payer: Medicaid Other | Admitting: Adult Health

## 2022-02-06 DIAGNOSIS — C50811 Malignant neoplasm of overlapping sites of right female breast: Secondary | ICD-10-CM | POA: Diagnosis not present

## 2022-02-07 DIAGNOSIS — C50811 Malignant neoplasm of overlapping sites of right female breast: Secondary | ICD-10-CM | POA: Diagnosis not present

## 2022-02-09 DIAGNOSIS — C50811 Malignant neoplasm of overlapping sites of right female breast: Secondary | ICD-10-CM | POA: Diagnosis not present

## 2022-02-12 DIAGNOSIS — C50811 Malignant neoplasm of overlapping sites of right female breast: Secondary | ICD-10-CM | POA: Diagnosis not present

## 2022-02-13 DIAGNOSIS — C50811 Malignant neoplasm of overlapping sites of right female breast: Secondary | ICD-10-CM | POA: Diagnosis not present

## 2022-02-14 DIAGNOSIS — C50811 Malignant neoplasm of overlapping sites of right female breast: Secondary | ICD-10-CM | POA: Diagnosis not present

## 2022-02-15 DIAGNOSIS — C50811 Malignant neoplasm of overlapping sites of right female breast: Secondary | ICD-10-CM | POA: Diagnosis not present

## 2022-02-16 ENCOUNTER — Telehealth: Payer: Self-pay | Admitting: *Deleted

## 2022-02-16 DIAGNOSIS — C50811 Malignant neoplasm of overlapping sites of right female breast: Secondary | ICD-10-CM | POA: Diagnosis not present

## 2022-02-16 MED ORDER — DOXYCYCLINE HYCLATE 100 MG PO TABS: 100.0000 mg | ORAL_TABLET | Freq: Two times a day (BID) | ORAL | 0 refills | Status: DC

## 2022-02-16 NOTE — Telephone Encounter (Signed)
This RN spoke with Suzanne [name] (pt's dtr) per her call stating mother had chills during the night and then this AM breast is having increased redness and is warm to the touch.  Pt does not have a fever.  Suzanne [name] is concerned due to possible " start of another cellulitis episode"  Note pt was last treated 12/10/2021 with episode and has since been released from Infectious Disease for care.  Per MD review - prescription for antibiotic obtained- with instructions that if symptoms worsen pt should proceed to the ER.  Pt is scheduled for follow up next Wed Sept 27.  This RN called Suzanne [name] and discussed, verified pharmacy and Suzanne [name] was able to repeat instructions back to this RN.

## 2022-02-19 DIAGNOSIS — C50811 Malignant neoplasm of overlapping sites of right female breast: Secondary | ICD-10-CM | POA: Diagnosis not present

## 2022-02-20 ENCOUNTER — Other Ambulatory Visit (HOSPITAL_COMMUNITY): Payer: Self-pay

## 2022-02-20 DIAGNOSIS — C50811 Malignant neoplasm of overlapping sites of right female breast: Secondary | ICD-10-CM | POA: Diagnosis not present

## 2022-02-21 ENCOUNTER — Other Ambulatory Visit: Payer: Self-pay

## 2022-02-21 ENCOUNTER — Inpatient Hospital Stay (HOSPITAL_BASED_OUTPATIENT_CLINIC_OR_DEPARTMENT_OTHER): Payer: Medicaid Other | Admitting: Hematology and Oncology

## 2022-02-21 ENCOUNTER — Inpatient Hospital Stay: Payer: Medicaid Other

## 2022-02-21 ENCOUNTER — Encounter: Payer: Self-pay | Admitting: Hematology and Oncology

## 2022-02-21 ENCOUNTER — Inpatient Hospital Stay: Payer: Medicaid Other | Attending: Adult Health

## 2022-02-21 VITALS — BP 134/77 | HR 97 | Temp 97.9°F | Resp 18 | Ht <= 58 in | Wt 178.9 lb

## 2022-02-21 DIAGNOSIS — Z170 Estrogen receptor positive status [ER+]: Secondary | ICD-10-CM

## 2022-02-21 DIAGNOSIS — C7951 Secondary malignant neoplasm of bone: Secondary | ICD-10-CM

## 2022-02-21 DIAGNOSIS — C50811 Malignant neoplasm of overlapping sites of right female breast: Secondary | ICD-10-CM | POA: Diagnosis not present

## 2022-02-21 DIAGNOSIS — J90 Pleural effusion, not elsewhere classified: Secondary | ICD-10-CM

## 2022-02-21 DIAGNOSIS — D518 Other vitamin B12 deficiency anemias: Secondary | ICD-10-CM

## 2022-02-21 LAB — CBC WITH DIFFERENTIAL (CANCER CENTER ONLY)
Abs Immature Granulocytes: 0.05 10*3/uL (ref 0.00–0.07)
Basophils Absolute: 0.1 10*3/uL (ref 0.0–0.1)
Basophils Relative: 1 %
Eosinophils Absolute: 0.2 10*3/uL (ref 0.0–0.5)
Eosinophils Relative: 3 %
HCT: 30.8 % — ABNORMAL LOW (ref 36.0–46.0)
Hemoglobin: 9.8 g/dL — ABNORMAL LOW (ref 12.0–15.0)
Immature Granulocytes: 1 %
Lymphocytes Relative: 16 %
Lymphs Abs: 1.2 10*3/uL (ref 0.7–4.0)
MCH: 31.5 pg (ref 26.0–34.0)
MCHC: 31.8 g/dL (ref 30.0–36.0)
MCV: 99 fL (ref 80.0–100.0)
Monocytes Absolute: 0.6 10*3/uL (ref 0.1–1.0)
Monocytes Relative: 8 %
Neutro Abs: 5.3 10*3/uL (ref 1.7–7.7)
Neutrophils Relative %: 71 %
Platelet Count: 277 10*3/uL (ref 150–400)
RBC: 3.11 MIL/uL — ABNORMAL LOW (ref 3.87–5.11)
RDW: 16.1 % — ABNORMAL HIGH (ref 11.5–15.5)
WBC Count: 7.3 10*3/uL (ref 4.0–10.5)
nRBC: 0 % (ref 0.0–0.2)

## 2022-02-21 LAB — CMP (CANCER CENTER ONLY)
ALT: 25 U/L (ref 0–44)
AST: 40 U/L (ref 15–41)
Albumin: 3.5 g/dL (ref 3.5–5.0)
Alkaline Phosphatase: 133 U/L — ABNORMAL HIGH (ref 38–126)
Anion gap: 9 (ref 5–15)
BUN: 19 mg/dL (ref 8–23)
CO2: 27 mmol/L (ref 22–32)
Calcium: 9.2 mg/dL (ref 8.9–10.3)
Chloride: 104 mmol/L (ref 98–111)
Creatinine: 0.75 mg/dL (ref 0.44–1.00)
GFR, Estimated: >60 mL/min (ref >60)
Glucose, Bld: 105 mg/dL — ABNORMAL HIGH (ref 70–99)
Potassium: 3.2 mmol/L — ABNORMAL LOW (ref 3.5–5.1)
Sodium: 140 mmol/L (ref 135–145)
Total Bilirubin: 0.6 mg/dL (ref 0.3–1.2)
Total Protein: 7.1 g/dL (ref 6.5–8.1)

## 2022-02-21 LAB — VITAMIN B12: Vitamin B-12: 1077 pg/mL — ABNORMAL HIGH (ref 180–914)

## 2022-02-21 LAB — FERRITIN: Ferritin: 51 ng/mL (ref 11–307)

## 2022-02-21 MED ORDER — DENOSUMAB 120 MG/1.7ML ~~LOC~~ SOLN
120.0000 mg | Freq: Once | SUBCUTANEOUS | Status: AC
  Administered 2022-02-21: 120 mg via SUBCUTANEOUS
  Filled 2022-02-21: qty 1.7

## 2022-02-21 NOTE — Progress Notes (Signed)
[facility] Follow up:    [name], NP [address] [name] [address]   DIAGNOSIS: Metastatic breast cancer  SUMMARY OF ONCOLOGIC HISTORY: [place], [place] woman presenting 10/26/2018 with right-sided inflammatory breast cancer, stage IV, involving lungs, lymph nodes and bones, as follows:             (a) chest CT scan 10/26/2018 shows bilateral pleural effusions, possible lymphangitic spread of tumor, diffuse bony metastatic disease, and significant axillary mediastinal and hilar adenopathy             (b) bone scan 10/27/2018 is a "near SuperScan" consistent with widespread bony metastatic disease             (c) head CT with and without contrast 10/30/2018 shows no intracranial metastatic disease, multiple calvarial lesions             (d) CA-27-29 on 10/27/2018 was 1810.4   (1) pleural fluid from right thoracentesis 10/28/2018 confirms malignant cells consistent with a breast primary, strongly estrogen and progesterone receptor positive, HER-2 not amplified, with an MIB-1 of 2%   (2) anastrozole started 10/29/2018             (a) palbociclib started 11/13/2018 at 100 mg dose             (b) denosumab/xgeva started 11/13/2018              (C) palbociclib dose decreased to 4m             (D) Palbociclib dose decreased to 75 mg every other day in 11/2021   (3) associated problems:             (a) hypoxia secondary to effusions             (b) pain from bone lesions and left humeral and vertebral compression fractures             (c) right upper extremity lymphedema             (d) poor venous access   (4) genetics testing on 12/25/2018 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 STK11, TP53, TSC1, TSC2, and VHL.  The following genes were evaluated for sequence changes only: SDHA and HOXB13 c.251G>A variant only. The [facility] Melanoma Panel analyzed the following 9 genes: BAP1 BRCA2 CDK4 CDKN2A MITF POT1 PTEN RB1 Tp53.    (5) palliative radiation to the left humerus 07/20/2019 - 07/31/2019 Site Technique Total Dose (Gy) Dose per Fx (Gy) Completed Fx Beam Energies  Humerus, Left: Ext_Lt_humerus Complex 30/30 3 10/10 6X    (6) anemia requiring transfusion:              (a) B-12 level 134 FEB 2022: B-12 injectons started              (B) iron deficient, IV iron to start 2/6    CURRENT THERAPY: Anastrozole, ILeslee Home Xgeva (holding Ibrance 01/08/2022)  INTERVAL HISTORY:  Patient is here  for follow-up with her daughter.  Since last visit, she had interruptions in [place] given recurrent cellulitis, hospitalization and some fungal infection.  She stopped Ibrance around mid August and has not resumed it since.  She most recently called once again with worsening redness of the right breast and need for another round of antibiotics.  She is now on doxycycline, tolerating it well.  She denies any new complaints today.  Her breast redness has significantly improved.  No fevers or chills.  She denies any new bone pains, change in breathing, bowel habits or urinary habits.  Patient Active Problem List   Diagnosis Date Noted   Intertrigo 01/10/2022   Bacterial vaginitis 01/10/2022   Sepsis due to cellulitis ([facility]) 12/06/2021   B12 deficiency anemia 08/15/2020   Intractable back pain 02/03/2019   Thoracic compression fracture, closed, initial encounter ([facility]) 02/03/2019   Essential hypertension 02/03/2019    Genetic testing 01/27/2019   Acute pharyngitis    Bacteremia    FTT (failure to thrive) in adult    Fracture closed, humerus, shaft 01/09/2019   Pneumonia 12/25/2018   Acute respiratory disorder in immunocompromised patient ([facility] [facility]) 12/25/2018   Family history of breast cancer    Family history of prostate cancer    Family history of melanoma    Family history of colon cancer    Pressure injury of skin 11/24/2018   Chronic respiratory failure with hypoxia and hypercapnia ([facility]) 11/19/2018   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   Hypophosphatemia 11/19/2018   Hypocalcemia 11/19/2018   Aspiration pneumonia (HCC) 11/19/2018   SOB (shortness of breath)    Palliative care by specialist    Carcinoma of breast, estrogen receptor positive, stage 4, right ([facility]) 11/16/2018   Hypoalbuminemia 11/16/2018   Pleural effusion 11/14/2018   Goals of care, counseling/discussion 11/06/2018   Malignant neoplasm of overlapping sites of right breast in female, estrogen receptor positive ([facility]) 10/31/2018   Lung metastasis 10/31/2018   Pain from bone metastases ([facility]) 10/31/2018   Morbid obesity with BMI of 40.0-44.9, adult ([place]) 10/31/2018   Bone injury    Pleural effusion, bilateral    Shortness of breath    Abnormal breast finding    Hypokalemia    Breast skin changes    Macrocytic anemia    Malignant neoplasm metastatic to bone (H[facility])    Anemia, B12 deficiency 10/26/2018    is allergic to ferumoxytol.  MEDICAL HISTORY: Past Medical History:  Diagnosis Date   Cancer [facility])    Family history of breast cancer    Family history of colon cancer    Family history of melanoma    Family history of prostate cancer    Gallstones    Glaucoma    Mastitis    reports history of recurrent mastitis   Metastatic breast cancer    Obesity     SURGICAL HISTORY: Past Surgical History:  Procedure Laterality Date   GLAUCOMA SURGERY  late 1990's   IR THORACENTESIS ASP PLEURAL SPACE W/IMG GUIDE  10/28/2018    IR THORACENTESIS ASP PLEURAL SPACE W/IMG GUIDE  10/29/2018   OPEN REDUCTION INTERNAL FIXATION (ORIF) DISTAL RADIAL FRACTURE Left 01/09/2019   Procedure: OPEN REDUCTION INTERNAL FIXATION (ORIF) HUMERAL FRACTURE;  Surgeon: Netta Cedars, MD;  Location: [facility];  Service: Orthopedics;  Laterality: Left;    SOCIAL HISTORY: Social History   Socioeconomic History   Marital status: Widowed    Spouse name: Not on file   Number of children: 1   Years of education: Not  on file   Highest education level: Not on file  Occupational History   Occupation: Clinical research associate Programs    Comment: Self-employed  Tobacco Use   Smoking status: Never   Smokeless tobacco: Never   Tobacco comments:    second hand smoke exposure  Vaping Use   Vaping Use: Never used  Substance and Sexual Activity   Alcohol use: No   Drug use: No   Sexual activity: Not Currently  Other Topics Concern   Not on file  Social History Narrative   Has a daughter named [name]. Daughter has CP.   Social Determinants of Health   Financial Resource Strain: Low Risk  (08/16/2021)   Overall Financial Resource Strain (CARDIA)    Difficulty of Paying Living Expenses: Not hard at all  Food Insecurity: Food Insecurity Present (11/08/2021)   Hunger Vital Sign    Worried About Running Out of Food in the Last Year: Sometimes true    Ran Out of Food in the Last Year: Sometimes true  Transportation Needs: No Transportation Needs (01/12/2022)   PRAPARE - Hydrologist (Medical): No    Lack of Transportation (Non-Medical): No  Physical Activity: Not on file  Stress: Stress Concern Present (11/08/2021)   [place]    Feeling of Stress : To some extent  Social Connections: Socially Isolated (03/27/2021)   Social Connection and Isolation Panel [NHANES]    Frequency of Communication with Friends and Family: Once a week    Frequency of Social  Gatherings with Friends and Family: More than three times a week    Attends Religious Services: Never    Marine scientist or Organizations: No    Attends Archivist Meetings: Never    Marital Status: Widowed  Intimate Partner Violence: Not At Risk (12/24/2018)   Humiliation, Afraid, Rape, and Kick questionnaire    Fear of Current or Ex-Partner: No    Emotionally Abused: No    Physically Abused: No    Sexually Abused: No    FAMILY HISTORY: Family History  Problem Relation Age of Onset   Breast cancer Sister        in her 5's-70s   Heart disease Brother        CABG   Heart disease Brother        CABG   Skin cancer Brother        melanoma   Colon cancer Cousin    Heart attack Mother    Heart attack Father    Prostate cancer Brother     Review of Systems  Constitutional:  Positive for fatigue. Negative for appetite change, chills, fever and unexpected weight change.  HENT:   Negative for hearing loss, lump/mass and trouble swallowing.   Eyes:  Negative for eye problems and icterus.  Respiratory:  Negative for chest tightness, cough and shortness of breath.   Cardiovascular:  Negative for chest pain, leg swelling and palpitations.  Gastrointestinal:  Negative for abdominal distention, abdominal pain, constipation, diarrhea, nausea and vomiting.  Endocrine: Negative for hot flashes.  Genitourinary:  Negative for difficulty urinating.   Musculoskeletal:  Negative for arthralgias.  Skin:  Positive for rash. Negative for itching.  Neurological:  Negative for dizziness, extremity weakness, headaches and numbness.  Hematological:  Negative for adenopathy. Does not bruise/bleed easily.  Psychiatric/Behavioral:  Negative for depression. The patient is not nervous/anxious.       PHYSICAL EXAMINATION  ECOG  PERFORMANCE STATUS: 2 - Symptomatic, <50% confined to bed  Vitals:   02/21/22 1419  BP: 134/77  Pulse: 97  Resp: 18  Temp: 97.9 F (36.6 C)  SpO2: 100%     Physical Exam Constitutional:      General: She is not in acute distress.    Appearance: Normal appearance. She is not toxic-appearing.     Comments: Examined in wheelchair  HENT:     Head: Normocephalic and atraumatic.  Eyes:     General: No scleral icterus. Cardiovascular:     Rate and Rhythm: Normal rate and regular rhythm.     Pulses: Normal pulses.     Heart sounds: Normal heart sounds.  Pulmonary:     Effort: Pulmonary effort is normal.     Breath sounds: Normal breath sounds.  Musculoskeletal:        General: No swelling.     Cervical back: Neck supple.  Lymphadenopathy:     Cervical: No cervical adenopathy.  Skin:    General: Skin is warm and dry.     Findings: Rash (erythematous rash in inframammary region, bilaterally.  Right breast skin redness has significantly improved) present.  Neurological:     General: No focal deficit present.     Mental Status: She is alert.  Psychiatric:        Mood and Affect: Mood normal.        Behavior: Behavior normal.     LABORATORY DATA:  CBC    Component Value Date/Time   WBC 7.3 02/21/2022 1336   WBC 2.0 (L) 12/10/2021 0326   RBC 3.11 (L) 02/21/2022 1336   HGB 9.8 (L) 02/21/2022 1336   HCT 30.8 (L) 02/21/2022 1336   PLT 277 02/21/2022 1336   MCV 99.0 02/21/2022 1336   MCH 31.5 02/21/2022 1336   MCHC 31.8 02/21/2022 1336   RDW 16.1 (H) 02/21/2022 1336   LYMPHSABS 1.2 02/21/2022 1336   MONOABS 0.6 02/21/2022 1336   EOSABS 0.2 02/21/2022 1336   BASOSABS 0.1 02/21/2022 1336    CMP     Component Value Date/Time   NA 140 02/21/2022 1336   K 3.2 (L) 02/21/2022 1336   CL 104 02/21/2022 1336   CO2 27 02/21/2022 1336   GLUCOSE 105 (H) 02/21/2022 1336   BUN 19 02/21/2022 1336   CREATININE 0.75 02/21/2022 1336   CALCIUM 9.2 02/21/2022 1336   PROT 7.1 02/21/2022 1336   ALBUMIN 3.5 02/21/2022 1336   AST 40 02/21/2022 1336   ALT 25 02/21/2022 1336   ALKPHOS 133 (H) 02/21/2022 1336   BILITOT 0.6 02/21/2022 1336    GFRNONAA >60 02/21/2022 1336   GFRAA >60 02/24/2020 1454   GFRAA >60 07/24/2019 1453      ASSESSMENT and THERAPY PLAN:  Suzanne [name] is a 61 y.o. female who presents to the clinic for follow up for metastatic breast cancer to the bone.   #Metastatic breast cancer involving the bone:  --Current treatment includes anastrozole, xgeva and ibrance. [name] was held starting 01/08/2022 due to ongoing infections.  -She otherwise continues on anastrozole and Xgeva.  I have asked her to resume Ibrance 75 mg once daily once she completes the current dose of doxycycline.  If she ends up having recurrent infections, she may have to be considered for prophylaxis since routine interruptions may not be a good idea from a breast cancer standpoint.  Also her tumor marker has been rising which is concerning.  We have also discussed about repeating imaging,  have ordered this for October.  #Intertrigo: --Completed fluconazole 200 mg q 3 days x 3 doses with improvement -- She is high risk for having recurrent fungal infection of the inframammary region. Cellulitis of the right breast She continues on doxycycline, course will be completed later this week For her microcytic anemia, she continues on folic acid supplementation daily Plan as mentioned above  All questions were answered. The patient knows to call the clinic with any problems, questions or concerns. We can certainly see the patient much sooner if necessary.  I have spent a total of 30 minutes minutes of face-to-face and non-face-to-face time, preparing to see the [place] a medically appropriate examination, counseling and educating the patient, documenting clinical information in the electronic health record, and care coordination.

## 2022-02-22 ENCOUNTER — Telehealth: Payer: Self-pay | Admitting: Emergency Medicine

## 2022-02-22 ENCOUNTER — Other Ambulatory Visit (HOSPITAL_COMMUNITY): Payer: Self-pay

## 2022-02-22 ENCOUNTER — Telehealth: Payer: Self-pay | Admitting: Hematology and Oncology

## 2022-02-22 DIAGNOSIS — C50811 Malignant neoplasm of overlapping sites of right female breast: Secondary | ICD-10-CM | POA: Diagnosis not present

## 2022-02-22 LAB — CANCER ANTIGEN 27.29: CA 27.29: 728.9 U/mL — ABNORMAL HIGH (ref 0.0–38.6)

## 2022-02-22 NOTE — Telephone Encounter (Signed)
Copied from [place] [id number]. Topic: General - Other >> Feb 22, 2022 11:35 AM Ludger Nutting wrote: Suzanne [name] with [facility] called and said she faxed a 3051 form for patient on 9/25. She would like to know the status. Please advise.

## 2022-02-22 NOTE — Telephone Encounter (Signed)
Contacted patient to scheduled appointments. Patient is aware of appointments that are scheduled.   

## 2022-02-22 NOTE — Telephone Encounter (Signed)
Fax received, will fax when signed.

## 2022-02-23 DIAGNOSIS — C50811 Malignant neoplasm of overlapping sites of right female breast: Secondary | ICD-10-CM | POA: Diagnosis not present

## 2022-02-25 DIAGNOSIS — J9621 Acute and chronic respiratory failure with hypoxia: Secondary | ICD-10-CM | POA: Diagnosis not present

## 2022-02-25 DIAGNOSIS — R0602 Shortness of breath: Secondary | ICD-10-CM | POA: Diagnosis not present

## 2022-02-25 DIAGNOSIS — C7801 Secondary malignant neoplasm of right lung: Secondary | ICD-10-CM | POA: Diagnosis not present

## 2022-02-25 DIAGNOSIS — S22000A Wedge compression fracture of unspecified thoracic vertebra, initial encounter for closed fracture: Secondary | ICD-10-CM | POA: Diagnosis not present

## 2022-02-26 ENCOUNTER — Telehealth: Payer: Self-pay | Admitting: *Deleted

## 2022-02-26 DIAGNOSIS — C50811 Malignant neoplasm of overlapping sites of right female breast: Secondary | ICD-10-CM | POA: Diagnosis not present

## 2022-02-26 MED ORDER — ONDANSETRON HCL 8 MG PO TABS: 8.0000 mg | ORAL_TABLET | Freq: Three times a day (TID) | ORAL | 1 refills | Status: DC | PRN

## 2022-02-26 NOTE — Telephone Encounter (Signed)
This RN spoke with [name] per her call stating episode on Saturday and then again on Sunday of vomiting x 1.  Pt does state she has nausea.  [name] states the zofran they had - expired and they do not have more in the home.  This RN informed [name] refill will be sent as well as to call if other concerns. [name] verbalized as well need to schedule CT scans due to rising tumor markers.  Pt is hydrating well.   Refill for Zofran called to pt's pharmacy.

## 2022-02-27 DIAGNOSIS — C50811 Malignant neoplasm of overlapping sites of right female breast: Secondary | ICD-10-CM | POA: Diagnosis not present

## 2022-02-28 DIAGNOSIS — C50811 Malignant neoplasm of overlapping sites of right female breast: Secondary | ICD-10-CM | POA: Diagnosis not present

## 2022-03-01 DIAGNOSIS — C50811 Malignant neoplasm of overlapping sites of right female breast: Secondary | ICD-10-CM | POA: Diagnosis not present

## 2022-03-02 ENCOUNTER — Other Ambulatory Visit (HOSPITAL_COMMUNITY): Payer: Self-pay

## 2022-03-02 ENCOUNTER — Encounter: Payer: Self-pay | Admitting: Hematology and Oncology

## 2022-03-02 DIAGNOSIS — C50811 Malignant neoplasm of overlapping sites of right female breast: Secondary | ICD-10-CM | POA: Diagnosis not present

## 2022-03-05 ENCOUNTER — Ambulatory Visit (HOSPITAL_COMMUNITY)
Admission: RE | Admit: 2022-03-05 | Discharge: 2022-03-05 | Disposition: A | Discharge status: Home / Self Care | Payer: Medicaid Other | Source: Ambulatory Visit | Attending: Hematology and Oncology | Admitting: Hematology and Oncology

## 2022-03-05 ENCOUNTER — Other Ambulatory Visit: Payer: Self-pay | Admitting: Hematology and Oncology

## 2022-03-05 DIAGNOSIS — Z170 Estrogen receptor positive status [ER+]: Secondary | ICD-10-CM

## 2022-03-05 DIAGNOSIS — C50811 Malignant neoplasm of overlapping sites of right female breast: Secondary | ICD-10-CM | POA: Insufficient documentation

## 2022-03-05 DIAGNOSIS — K8020 Calculus of gallbladder without cholecystitis without obstruction: Secondary | ICD-10-CM | POA: Diagnosis not present

## 2022-03-05 DIAGNOSIS — R918 Other nonspecific abnormal finding of lung field: Secondary | ICD-10-CM | POA: Diagnosis not present

## 2022-03-05 DIAGNOSIS — N200 Calculus of kidney: Secondary | ICD-10-CM | POA: Diagnosis not present

## 2022-03-05 DIAGNOSIS — C50919 Malignant neoplasm of unspecified site of unspecified female breast: Secondary | ICD-10-CM | POA: Diagnosis not present

## 2022-03-05 MED ORDER — IOHEXOL 9 MG/ML PO SOLN: 1000.0000 mL | Freq: Once | ORAL | Status: DC

## 2022-03-05 MED ORDER — SODIUM CHLORIDE (PF) 0.9 % IJ SOLN
INTRAMUSCULAR | Status: AC
  Filled 2022-03-05: qty 50

## 2022-03-05 MED ORDER — IOHEXOL 300 MG/ML  SOLN
100.0000 mL | Freq: Once | INTRAMUSCULAR | Status: AC | PRN
  Administered 2022-03-05: 100 mL via INTRAVENOUS

## 2022-03-06 DIAGNOSIS — C50811 Malignant neoplasm of overlapping sites of right female breast: Secondary | ICD-10-CM | POA: Diagnosis not present

## 2022-03-07 ENCOUNTER — Telehealth: Payer: Self-pay | Admitting: *Deleted

## 2022-03-07 DIAGNOSIS — C50811 Malignant neoplasm of overlapping sites of right female breast: Secondary | ICD-10-CM | POA: Diagnosis not present

## 2022-03-07 NOTE — Telephone Encounter (Signed)
This RN returned call to Suzanne [name] per VM left stating continued nausea and decreased appetite but no vomiting.  Presently not scheduled for follow up visit until 10/26 and inquiry is per symptoms and CT results what needs to be done.  This RN noted CT showing progression- per discussion with Suzanne [name] she stated she had read the scans " like I always do - and usually they state good things- so this reading got me a little down"  This RN validated her feelings as well as she has maintained well on current regimen and not unusual for the cancer to find ways to grow- goal is to tweak the therapy to help her better and control her symptoms.  This RN scheduled pt for appts this Friday with MD for further review and discussion.  Pt and Suzanne [name] verbalized understanding.  No other needs at this time.

## 2022-03-08 ENCOUNTER — Other Ambulatory Visit: Payer: Self-pay | Admitting: *Deleted

## 2022-03-08 DIAGNOSIS — C50811 Malignant neoplasm of overlapping sites of right female breast: Secondary | ICD-10-CM

## 2022-03-09 ENCOUNTER — Inpatient Hospital Stay: Payer: Medicaid Other

## 2022-03-09 ENCOUNTER — Inpatient Hospital Stay: Payer: Medicaid Other | Attending: Adult Health | Admitting: Hematology and Oncology

## 2022-03-09 ENCOUNTER — Encounter: Payer: Self-pay | Admitting: Hematology and Oncology

## 2022-03-09 VITALS — BP 126/77 | HR 97 | Temp 97.5°F | Resp 17 | Wt 177.4 lb

## 2022-03-09 DIAGNOSIS — C50811 Malignant neoplasm of overlapping sites of right female breast: Secondary | ICD-10-CM | POA: Diagnosis not present

## 2022-03-09 DIAGNOSIS — Z79899 Other long term (current) drug therapy: Secondary | ICD-10-CM | POA: Diagnosis not present

## 2022-03-09 DIAGNOSIS — C7800 Secondary malignant neoplasm of unspecified lung: Secondary | ICD-10-CM | POA: Insufficient documentation

## 2022-03-09 DIAGNOSIS — Z170 Estrogen receptor positive status [ER+]: Secondary | ICD-10-CM | POA: Diagnosis not present

## 2022-03-09 DIAGNOSIS — Z5111 Encounter for antineoplastic chemotherapy: Secondary | ICD-10-CM | POA: Insufficient documentation

## 2022-03-09 DIAGNOSIS — C779 Secondary and unspecified malignant neoplasm of lymph node, unspecified: Secondary | ICD-10-CM | POA: Insufficient documentation

## 2022-03-09 DIAGNOSIS — C7951 Secondary malignant neoplasm of bone: Secondary | ICD-10-CM

## 2022-03-09 LAB — CBC WITH DIFFERENTIAL (CANCER CENTER ONLY)
Abs Immature Granulocytes: 0.02 10*3/uL (ref 0.00–0.07)
Basophils Absolute: 0 10*3/uL (ref 0.0–0.1)
Basophils Relative: 1 %
Eosinophils Absolute: 0.1 10*3/uL (ref 0.0–0.5)
Eosinophils Relative: 3 %
HCT: 28.4 % — ABNORMAL LOW (ref 36.0–46.0)
Hemoglobin: 9.2 g/dL — ABNORMAL LOW (ref 12.0–15.0)
Immature Granulocytes: 1 %
Lymphocytes Relative: 22 %
Lymphs Abs: 0.8 10*3/uL (ref 0.7–4.0)
MCH: 33.1 pg (ref 26.0–34.0)
MCHC: 32.4 g/dL (ref 30.0–36.0)
MCV: 102.2 fL — ABNORMAL HIGH (ref 80.0–100.0)
Monocytes Absolute: 0.2 10*3/uL (ref 0.1–1.0)
Monocytes Relative: 4 %
Neutro Abs: 2.4 10*3/uL (ref 1.7–7.7)
Neutrophils Relative %: 69 %
Platelet Count: 211 10*3/uL (ref 150–400)
RBC: 2.78 MIL/uL — ABNORMAL LOW (ref 3.87–5.11)
RDW: 18.5 % — ABNORMAL HIGH (ref 11.5–15.5)
Smear Review: NORMAL
WBC Count: 3.5 10*3/uL — ABNORMAL LOW (ref 4.0–10.5)
nRBC: 0 % (ref 0.0–0.2)

## 2022-03-09 LAB — CMP (CANCER CENTER ONLY)
ALT: 13 U/L (ref 0–44)
AST: 27 U/L (ref 15–41)
Albumin: 4 g/dL (ref 3.5–5.0)
Alkaline Phosphatase: 122 U/L (ref 38–126)
Anion gap: 4 — ABNORMAL LOW (ref 5–15)
BUN: 17 mg/dL (ref 8–23)
CO2: 31 mmol/L (ref 22–32)
Calcium: 9.4 mg/dL (ref 8.9–10.3)
Chloride: 105 mmol/L (ref 98–111)
Creatinine: 1.12 mg/dL — ABNORMAL HIGH (ref 0.44–1.00)
GFR, Estimated: 55 mL/min — ABNORMAL LOW (ref >60)
Glucose, Bld: 118 mg/dL — ABNORMAL HIGH (ref 70–99)
Potassium: 4 mmol/L (ref 3.5–5.1)
Sodium: 140 mmol/L (ref 135–145)
Total Bilirubin: 0.6 mg/dL (ref 0.3–1.2)
Total Protein: 7.2 g/dL (ref 6.5–8.1)

## 2022-03-09 MED ORDER — FULVESTRANT 250 MG/5ML IM SOSY
500.0000 mg | PREFILLED_SYRINGE | Freq: Once | INTRAMUSCULAR | Status: AC
  Administered 2022-03-09: 500 mg via INTRAMUSCULAR
  Filled 2022-03-09: qty 10

## 2022-03-09 NOTE — Progress Notes (Signed)
[facility] Follow up:    Suzanne Pounds, NP [address] [place] [address]   DIAGNOSIS: Metastatic breast cancer  SUMMARY OF ONCOLOGIC HISTORY: Suzanne [name], [place] woman presenting 10/26/2018 with right-sided inflammatory breast cancer, stage IV, involving lungs, lymph nodes and bones, as follows:             (a) chest CT scan 10/26/2018 shows bilateral pleural effusions, possible lymphangitic spread of tumor, diffuse bony metastatic disease, and significant axillary mediastinal and hilar adenopathy             (b) bone scan 10/27/2018 is a "near SuperScan" consistent with widespread bony metastatic disease             (c) head CT with and without contrast 10/30/2018 shows no intracranial metastatic disease, multiple calvarial lesions             (d) CA-27-29 on 10/27/2018 was 1810.4   (1) pleural fluid from right thoracentesis 10/28/2018 confirms malignant cells consistent with a breast primary, strongly estrogen and progesterone receptor positive, HER-2 not amplified, with an MIB-1 of 2%   (2) anastrozole started 10/29/2018             (a) palbociclib started 11/13/2018 at 100 mg dose             (b) denosumab/xgeva started 11/13/2018              (C) palbociclib dose decreased to 98m             (D) Palbociclib dose decreased to 75 mg every other day in 11/2021   (3) associated problems:             (a) hypoxia secondary to effusions             (b) pain from bone lesions and left humeral and vertebral compression fractures             (c) right upper extremity lymphedema             (d) poor venous access   (4) genetics testing on 12/25/2018 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 STK11, TP53, TSC1, TSC2, and VHL.  The following genes were evaluated for sequence changes only: SDHA and HOXB13 c.251G>A variant only. The [facility] Melanoma Panel analyzed the following 9 genes: BAP1 BRCA2 CDK4 CDKN2A MITF POT1 PTEN RB1 Tp53.    (5) palliative radiation to the left humerus 07/20/2019 - 07/31/2019 Site Technique Total Dose (Gy) Dose per Fx (Gy) Completed Fx Beam Energies  Humerus, Left: Ext_Lt_humerus Complex 30/30 3 10/10 6X    (6) anemia requiring transfusion:              (a) B-12 level 134 FEB 2022: B-12 injectons started              (B) iron deficient, IV iron to start 2/6    CURRENT THERAPY: Anastrozole, Suzanne [name] Xgeva (holding Ibrance 01/08/2022)  INTERVAL HISTORY:  Patient is here  for follow-up with her daughter.   Since last visit, she is now back on Ibrance.  She has been tolerating this well.  No more episodes of cellulitis.  She had some transportation issues today.  She read her imaging reports already and was worried about this.  Rest of the pertinent 10 point ROS reviewed and negative  Patient Active Problem List   Diagnosis Date Noted   Intertrigo 01/10/2022   Bacterial vaginitis 01/10/2022   Sepsis due to cellulitis ([place]) 12/06/2021   B12 deficiency anemia 08/15/2020   Intractable back pain 02/03/2019   Thoracic compression fracture, closed, initial encounter ([place]) 02/03/2019   Essential hypertension 02/03/2019   Genetic testing 01/27/2019   Acute pharyngitis    Bacteremia    FTT (failure to thrive) in adult    Fracture closed, humerus, shaft 01/09/2019   Pneumonia 12/25/2018   Acute respiratory disorder in immunocompromised patient ([place]) 12/25/2018   Family history of  breast cancer    Family history of prostate cancer    Family history of melanoma    Family history of colon cancer    Pressure injury of skin 11/24/2018   Chronic respiratory failure with hypoxia and hypercapnia ([name]) 11/19/2018   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   Hypophosphatemia 11/19/2018   Hypocalcemia 11/19/2018   Aspiration pneumonia ([place]) 11/19/2018   SOB (shortness of breath)    Palliative care by specialist    Carcinoma of breast, estrogen receptor positive, stage 4, right ([place]) 11/16/2018   Hypoalbuminemia 11/16/2018   Pleural effusion 11/14/2018   Goals of care, counseling/discussion 11/06/2018   Malignant neoplasm of overlapping sites of right breast in female, estrogen receptor positive ([place]) 10/31/2018   Lung metastasis 10/31/2018   Pain from bone metastases ([place]) 10/31/2018   Morbid obesity with BMI of 40.0-44.9, adult ([place]) 10/31/2018   Bone injury    Pleural effusion, bilateral    Shortness of breath    Abnormal breast finding    Hypokalemia    Breast skin changes    Macrocytic anemia    Malignant neoplasm metastatic to bone ([facility])    Anemia, B12 deficiency 10/26/2018    is allergic to ferumoxytol.  MEDICAL HISTORY: Past Medical History:  Diagnosis Date   Cancer [facility])    Family history of breast cancer    Family history of colon cancer    Family history of melanoma    Family history of prostate cancer    Gallstones    Glaucoma    Mastitis    reports history of recurrent mastitis   Metastatic breast cancer    Obesity     SURGICAL HISTORY: Past Surgical History:  Procedure Laterality Date   GLAUCOMA SURGERY  late 1990's   IR THORACENTESIS ASP PLEURAL SPACE W/IMG GUIDE  10/28/2018   IR THORACENTESIS ASP PLEURAL SPACE W/IMG GUIDE  10/29/2018   OPEN REDUCTION INTERNAL FIXATION (ORIF) DISTAL RADIAL FRACTURE Left 01/09/2019   Procedure: OPEN REDUCTION INTERNAL FIXATION (ORIF) HUMERAL FRACTURE;  Surgeon: Netta Cedars, MD;  Location: [facility];  Service:  Orthopedics;  Laterality: Left;    SOCIAL HISTORY: Social History   Socioeconomic History   Marital status: Widowed    Spouse name: Not on file   Number of children: 1   Years of education: Not on file   Highest education level: Not on file  Occupational History   Occupation: Clinical research associate Programs    Comment: Self-employed  Tobacco Use   Smoking status: Never   Smokeless tobacco: Never   Tobacco  comments:    second hand smoke exposure  Vaping Use   Vaping Use: Never used  Substance and Sexual Activity   Alcohol use: No   Drug use: No   Sexual activity: Not Currently  Other Topics Concern   Not on file  Social History Narrative   Has a daughter named [name]. Daughter has CP.   Social Determinants of Health   Financial Resource Strain: Low Risk  (08/16/2021)   Overall Financial Resource Strain (CARDIA)    Difficulty of Paying Living Expenses: Not hard at all  Food Insecurity: Food Insecurity Present (11/08/2021)   Hunger Vital Sign    Worried About Running Out of Food in the Last Year: Sometimes true    Ran Out of Food in the Last Year: Sometimes true  Transportation Needs: No Transportation Needs (01/12/2022)   PRAPARE - Hydrologist (Medical): No    Lack of Transportation (Non-Medical): No  Physical Activity: Not on file  Stress: Stress Concern Present (11/08/2021)   Sneads    Feeling of Stress : To some extent  Social Connections: Socially Isolated (03/27/2021)   Social Connection and Isolation Panel [NHANES]    Frequency of Communication with Friends and Family: Once a week    Frequency of Social Gatherings with Friends and Family: More than three times a week    Attends Religious Services: Never    Marine scientist or Organizations: No    Attends Archivist Meetings: Never    Marital Status: Widowed  Intimate Partner Violence: Not At Risk  (12/24/2018)   Humiliation, Afraid, Rape, and Kick questionnaire    Fear of Current or Ex-Partner: No    Emotionally Abused: No    Physically Abused: No    Sexually Abused: No    FAMILY HISTORY: Family History  Problem Relation Age of Onset   Breast cancer Sister        in her 77's-70s   Heart disease Brother        CABG   Heart disease Brother        CABG   Skin cancer Brother        melanoma   Colon cancer Cousin    Heart attack Mother    Heart attack Father    Prostate cancer Brother     Review of Systems  Constitutional:  Positive for fatigue. Negative for appetite change, chills, fever and unexpected weight change.  HENT:   Negative for hearing loss, lump/mass and trouble swallowing.   Eyes:  Negative for eye problems and icterus.  Respiratory:  Negative for chest tightness, cough and shortness of breath.   Cardiovascular:  Negative for chest pain, leg swelling and palpitations.  Gastrointestinal:  Negative for abdominal distention, abdominal pain, constipation, diarrhea, nausea and vomiting.  Endocrine: Negative for hot flashes.  Genitourinary:  Negative for difficulty urinating.   Musculoskeletal:  Negative for arthralgias.  Skin:  Positive for rash. Negative for itching.  Neurological:  Negative for dizziness, extremity weakness, headaches and numbness.  Hematological:  Negative for adenopathy. Does not bruise/bleed easily.  Psychiatric/Behavioral:  Negative for depression. The patient is not nervous/anxious.       PHYSICAL EXAMINATION  ECOG PERFORMANCE STATUS: 2 - Symptomatic, <50% confined to bed  Vitals:   03/09/22 1237  BP: 126/77  Pulse: 97  Resp: 17  Temp: (!) 97.5 F (36.4 C)  SpO2: 100%    PE deferred in  lieu of counseling  LABORATORY DATA:  CBC    Component Value Date/Time   WBC 3.5 (L) 03/09/2022 1315   WBC 2.0 (L) 12/10/2021 0326   RBC 2.78 (L) 03/09/2022 1315   HGB 9.2 (L) 03/09/2022 1315   HCT 28.4 (L) 03/09/2022 1315   PLT 211  03/09/2022 1315   MCV 102.2 (H) 03/09/2022 1315   MCH 33.1 03/09/2022 1315   MCHC 32.4 03/09/2022 1315   RDW 18.5 (H) 03/09/2022 1315   LYMPHSABS 0.8 03/09/2022 1315   MONOABS 0.2 03/09/2022 1315   EOSABS 0.1 03/09/2022 1315   BASOSABS 0.0 03/09/2022 1315    CMP     Component Value Date/Time   NA 140 03/09/2022 1315   K 4.0 03/09/2022 1315   CL 105 03/09/2022 1315   CO2 31 03/09/2022 1315   GLUCOSE 118 (H) 03/09/2022 1315   BUN 17 03/09/2022 1315   CREATININE 1.12 (H) 03/09/2022 1315   CALCIUM 9.4 03/09/2022 1315   PROT 7.2 03/09/2022 1315   ALBUMIN 4.0 03/09/2022 1315   AST 27 03/09/2022 1315   ALT 13 03/09/2022 1315   ALKPHOS 122 03/09/2022 1315   BILITOT 0.6 03/09/2022 1315   GFRNONAA 55 (L) 03/09/2022 1315   GFRAA >60 02/24/2020 1454   GFRAA >60 07/24/2019 1453      ASSESSMENT and THERAPY PLAN:  Suzanne [name] is a 61 y.o. female who presents to the clinic for follow up for metastatic breast cancer to the bone.   #Metastatic breast cancer involving the bone:  --Current treatment includes anastrozole, xgeva and ibrance. Leslee [name] was held starting 01/08/2022 due to ongoing infections.  We restarted this about 3 weeks ago after she had resolution of cellulitis.  She is now here to review imaging which is concerning for progression.  We have suspected that there could be some progression ongoing given her rising tumor markers and since she has been on this combination for almost 2+ years.  She does not have a solid tumor biopsy to send foundation 1 testing unfortunately.  Hence we will have to arrange for CT-guided liver biopsy.  We will send for molecular testing after the liver biopsy results.  This will also help reassess the prognostics for appropriate treatment.  In the interim I have suggested changing the anastrozole to Faslodex.  She can finish the current cycle of Ibrance and discontinue. She expressed understanding of all the recommendations and she is agreeable  with this. Return to clinic in approximately 2 to 3 weeks, hoping the biopsy will be done soon.  All questions were answered. The patient knows to call the clinic with any problems, questions or concerns. We can certainly see the patient much sooner if necessary.  I have spent a total of 30 minutes minutes of face-to-face and non-face-to-face time, preparing to see the [place] a medically appropriate examination, counseling and educating the patient, documenting clinical information in the electronic health record, and care coordination.

## 2022-03-09 NOTE — Patient Instructions (Signed)

## 2022-03-10 LAB — CANCER ANTIGEN 27.29: CA 27.29: 787.4 U/mL — ABNORMAL HIGH (ref 0.0–38.6)

## 2022-03-12 DIAGNOSIS — C50811 Malignant neoplasm of overlapping sites of right female breast: Secondary | ICD-10-CM | POA: Diagnosis not present

## 2022-03-13 ENCOUNTER — Other Ambulatory Visit: Payer: Self-pay | Admitting: *Deleted

## 2022-03-13 DIAGNOSIS — C50811 Malignant neoplasm of overlapping sites of right female breast: Secondary | ICD-10-CM | POA: Diagnosis not present

## 2022-03-13 NOTE — Patient Outreach (Signed)
Care Coordination  03/13/2022  ADALI PENNINGS [birth year] [phone]   Successful outreach with Ms. Woloszyn today. RNCM following up with Ms. Buswell for new barriers to managing her healthcare. Ms. Rooks denies any needs today and is aware that she can reach [facility] if new needs arise or ask her PCP to place a referral to Care Coordination. Ms. Rupnow did have questions regarding flu, pneumonia and tetanus vaccines and will discuss these with her Oncology provider during the appointment on 03/22/22.    [name] RN, BSN [place]  [employer] manager

## 2022-03-14 DIAGNOSIS — C50811 Malignant neoplasm of overlapping sites of right female breast: Secondary | ICD-10-CM | POA: Diagnosis not present

## 2022-03-15 DIAGNOSIS — C50811 Malignant neoplasm of overlapping sites of right female breast: Secondary | ICD-10-CM | POA: Diagnosis not present

## 2022-03-16 DIAGNOSIS — C50811 Malignant neoplasm of overlapping sites of right female breast: Secondary | ICD-10-CM | POA: Diagnosis not present

## 2022-03-19 DIAGNOSIS — C50811 Malignant neoplasm of overlapping sites of right female breast: Secondary | ICD-10-CM | POA: Diagnosis not present

## 2022-03-20 ENCOUNTER — Other Ambulatory Visit (HOSPITAL_COMMUNITY): Payer: Self-pay

## 2022-03-20 ENCOUNTER — Encounter: Payer: Self-pay | Admitting: *Deleted

## 2022-03-20 DIAGNOSIS — C50811 Malignant neoplasm of overlapping sites of right female breast: Secondary | ICD-10-CM | POA: Diagnosis not present

## 2022-03-20 NOTE — Progress Notes (Signed)
Benay Pike, MD  Hope Pigeon, MD Any new area of progression works although typically we go after a metastatic site. In this case, any site is ok with me.       Previous Messages    ----- Message ----- From: Roosvelt Maser Sent: 03/19/2022  11:57 AM EDT To: Juliet Rude, MD; Benay Pike, MD Subject: RE: CT Biopsy                                  Dr. Chryl Heck,  See below what area to bx?   ----- Message ----- From: Juliet Rude, MD Sent: 03/19/2022  11:34 AM EDT To: Roosvelt Maser Subject: RE: CT Biopsy                                  There are multiple biopsy targets, did referring MD specify?  We can do left axillary LN in [place] (my preference), or liver lesion core biopsy in [place] as well. Can we clarify with provider unless otherwise specified?  Thanks.

## 2022-03-20 NOTE — Progress Notes (Unsigned)
Suttle, Dylan J, MD  Kadiatou Oplinger D Approved for ultrasound guided liver mass biopsy.    Dylan     

## 2022-03-21 NOTE — Telephone Encounter (Signed)
Suzanne [name], Nurse case manager from [facility] called in checking status of renew of PCS form. Please call back with status

## 2022-03-21 NOTE — Telephone Encounter (Signed)
Suzanne [name] with [employer] has called back again after 5 bothered by the fact that this form has not been sent. States started on 9/25 pls fu as states promised to send today.

## 2022-03-22 ENCOUNTER — Telehealth: Payer: Self-pay | Admitting: Emergency Medicine

## 2022-03-22 ENCOUNTER — Other Ambulatory Visit: Payer: Self-pay

## 2022-03-22 ENCOUNTER — Encounter: Payer: Self-pay | Admitting: Adult Health

## 2022-03-22 ENCOUNTER — Inpatient Hospital Stay: Payer: Medicaid Other

## 2022-03-22 ENCOUNTER — Encounter: Payer: Self-pay | Admitting: *Deleted

## 2022-03-22 ENCOUNTER — Other Ambulatory Visit: Payer: Self-pay | Admitting: *Deleted

## 2022-03-22 ENCOUNTER — Inpatient Hospital Stay (HOSPITAL_BASED_OUTPATIENT_CLINIC_OR_DEPARTMENT_OTHER): Payer: Medicaid Other

## 2022-03-22 ENCOUNTER — Inpatient Hospital Stay (HOSPITAL_BASED_OUTPATIENT_CLINIC_OR_DEPARTMENT_OTHER): Payer: Medicaid Other | Admitting: Adult Health

## 2022-03-22 VITALS — BP 123/78 | HR 80 | Temp 97.5°F | Resp 18 | Ht <= 58 in | Wt 179.3 lb

## 2022-03-22 DIAGNOSIS — C50811 Malignant neoplasm of overlapping sites of right female breast: Secondary | ICD-10-CM

## 2022-03-22 DIAGNOSIS — C7951 Secondary malignant neoplasm of bone: Secondary | ICD-10-CM

## 2022-03-22 DIAGNOSIS — Z170 Estrogen receptor positive status [ER+]: Secondary | ICD-10-CM | POA: Diagnosis not present

## 2022-03-22 DIAGNOSIS — D518 Other vitamin B12 deficiency anemias: Secondary | ICD-10-CM

## 2022-03-22 DIAGNOSIS — Z5111 Encounter for antineoplastic chemotherapy: Secondary | ICD-10-CM | POA: Diagnosis not present

## 2022-03-22 DIAGNOSIS — J90 Pleural effusion, not elsewhere classified: Secondary | ICD-10-CM

## 2022-03-22 LAB — CBC WITH DIFFERENTIAL (CANCER CENTER ONLY)
Abs Immature Granulocytes: 0.01 10*3/uL (ref 0.00–0.07)
Basophils Absolute: 0.1 10*3/uL (ref 0.0–0.1)
Basophils Relative: 2 %
Eosinophils Absolute: 0 10*3/uL (ref 0.0–0.5)
Eosinophils Relative: 2 %
HCT: 27.8 % — ABNORMAL LOW (ref 36.0–46.0)
Hemoglobin: 9 g/dL — ABNORMAL LOW (ref 12.0–15.0)
Immature Granulocytes: 0 %
Lymphocytes Relative: 33 %
Lymphs Abs: 0.7 10*3/uL (ref 0.7–4.0)
MCH: 33.8 pg (ref 26.0–34.0)
MCHC: 32.4 g/dL (ref 30.0–36.0)
MCV: 104.5 fL — ABNORMAL HIGH (ref 80.0–100.0)
Monocytes Absolute: 0.3 10*3/uL (ref 0.1–1.0)
Monocytes Relative: 12 %
Neutro Abs: 1.2 10*3/uL — ABNORMAL LOW (ref 1.7–7.7)
Neutrophils Relative %: 51 %
Platelet Count: 167 10*3/uL (ref 150–400)
RBC: 2.66 MIL/uL — ABNORMAL LOW (ref 3.87–5.11)
RDW: 20 % — ABNORMAL HIGH (ref 11.5–15.5)
WBC Count: 2.3 10*3/uL — ABNORMAL LOW (ref 4.0–10.5)
nRBC: 0 % (ref 0.0–0.2)

## 2022-03-22 LAB — CMP (CANCER CENTER ONLY)
ALT: 14 U/L (ref 0–44)
AST: 26 U/L (ref 15–41)
Albumin: 3.7 g/dL (ref 3.5–5.0)
Alkaline Phosphatase: 115 U/L (ref 38–126)
Anion gap: 4 — ABNORMAL LOW (ref 5–15)
BUN: 16 mg/dL (ref 8–23)
CO2: 28 mmol/L (ref 22–32)
Calcium: 8.8 mg/dL — ABNORMAL LOW (ref 8.9–10.3)
Chloride: 107 mmol/L (ref 98–111)
Creatinine: 0.8 mg/dL (ref 0.44–1.00)
GFR, Estimated: >60 mL/min (ref >60)
Glucose, Bld: 112 mg/dL — ABNORMAL HIGH (ref 70–99)
Potassium: 3.7 mmol/L (ref 3.5–5.1)
Sodium: 139 mmol/L (ref 135–145)
Total Bilirubin: 0.5 mg/dL (ref 0.3–1.2)
Total Protein: 6.7 g/dL (ref 6.5–8.1)

## 2022-03-22 LAB — FERRITIN: Ferritin: 17 ng/mL (ref 11–307)

## 2022-03-22 LAB — VITAMIN B12: Vitamin B-12: 867 pg/mL (ref 180–914)

## 2022-03-22 MED ORDER — DENOSUMAB 120 MG/1.7ML ~~LOC~~ SOLN
120.0000 mg | Freq: Once | SUBCUTANEOUS | Status: AC
  Administered 2022-03-22: 120 mg via SUBCUTANEOUS
  Filled 2022-03-22: qty 1.7

## 2022-03-22 NOTE — Telephone Encounter (Signed)
Suzanne [name] with [facility] has called back and is very upset that she still has not received a call back and or the form [phone].  Fax- [phone] Callback [phone]

## 2022-03-22 NOTE — Progress Notes (Signed)
l-Abd, Joesph Fillers, MD  Benay Pike, MD; Roosvelt Maser Thank you.  Lets schedule the pt in [place] with sedation, and the performing provider can decide on liver or left axillary core biopsy based on what they see as safer at the time. Not sure which will be clearer with [place].  Thanks, Programme researcher, broadcasting/film/video       Previous Messages    ----- Message ----- From: Benay Pike, MD Sent: 03/20/2022   2:59 PM EDT To: Juliet Rude, MD; Roosvelt Maser Subject: RE: CT Biopsy                                  Any new area of progression works although typically we go after a metastatic site. In this case, any site is ok with me.

## 2022-03-22 NOTE — Progress Notes (Signed)
[facility] Follow up:    [name], NP [address] [place] [address]   DIAGNOSIS: Stage IV breast cancer  SUMMARY OF ONCOLOGIC HISTORY: [place], [place] woman presenting 10/26/2018 with right-sided inflammatory breast cancer, stage IV, involving lungs, lymph nodes and bones, as follows:             (a) chest CT scan 10/26/2018 shows bilateral pleural effusions, possible lymphangitic spread of tumor, diffuse bony metastatic disease, and significant axillary mediastinal and hilar adenopathy             (b) bone scan 10/27/2018 is a "near [place]" consistent with widespread bony metastatic disease             (c) head CT with and without contrast 10/30/2018 shows no intracranial metastatic disease, multiple calvarial lesions             (d) CA-27-29 on 10/27/2018 was 1810.4   (1) pleural fluid from right thoracentesis 10/28/2018 confirms malignant cells consistent with a breast primary, strongly estrogen and progesterone receptor positive, HER-2 not amplified, with an MIB-1 of 2%   (2) anastrozole started 10/29/2018             (a) palbociclib started 11/13/2018 at 100 mg dose             (b) denosumab/xgeva started 11/13/2018              (C) palbociclib dose decreased to 64m             (D) Palbociclib dose decreased to 75 mg every other day in 11/2021 (E) Palbociclib on hold beginning 01/08/2022  (3) associated problems:             (a) hypoxia secondary to effusions             (b) pain from bone lesions and left humeral and vertebral compression fractures             (c) right upper extremity lymphedema             (d) poor venous access   (4) genetics testing on 12/25/2018 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 STK11, TP53, TSC1, TSC2, and VHL.  The following genes were evaluated for sequence changes only: SDHA and HOXB13 c.251G>A variant only. The [facility] Melanoma Panel analyzed the following 9 genes: BAP1 BRCA2 CDK4 CDKN2A MITF POT1 PTEN RB1 Tp53.    (5) palliative radiation to the left humerus 07/20/2019 - 07/31/2019 Site Technique Total Dose (Gy) Dose per Fx (Gy) Completed Fx Beam Energies  Humerus, Left: Ext_Lt_humerus Complex 30/30 3 10/10 6X    (6) anemia requiring transfusion:              (a) B-12 level 134 FEB 2022: B-12 injectons started              (B) iron deficient, IV iron to start 2/6   CURRENT THERAPY:  INTERVAL HISTORY: LMONNA CREAN61y.[id number].  female returns for follow-up and evaluation of her metastatic breast cancer.  She underwent restaging scans on March 05, 2022 that showed extensive hepatic metastatic disease new pleural metastatic disease and an increase in the left axillary lymph node.  A CT-guided biopsy was ordered and biopsy was changed by radiology to sound guided biopsy and was not scheduled until November 26.    She is to receive Faslodex along with the Xgeva given every 4 weeks.  She is doing moderately well and has no significant concerns today.  Patient Active Problem List   Diagnosis Date Noted   Intertrigo 01/10/2022   Bacterial vaginitis 01/10/2022   Sepsis due to cellulitis ([place]) 12/06/2021   B12 deficiency anemia 08/15/2020   Intractable back pain 02/03/2019   Thoracic compression fracture, closed, initial encounter ([place]) 02/03/2019   Essential hypertension 02/03/2019   Genetic testing 01/27/2019   Acute pharyngitis    Bacteremia     FTT (failure to thrive) in adult    Fracture closed, humerus, shaft 01/09/2019   Pneumonia 12/25/2018   Acute respiratory disorder in immunocompromised patient ([place]) 12/25/2018   Family history of breast cancer    Family history of prostate cancer    Family history of melanoma    Family history of colon cancer    Pressure injury of skin 11/24/2018   Chronic respiratory failure with hypoxia and hypercapnia ([place]) 11/19/2018   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   Hypophosphatemia 11/19/2018   Hypocalcemia 11/19/2018   Aspiration pneumonia (H[facility]) 11/19/2018   SOB (shortness of breath)    Palliative care by specialist    Carcinoma of breast, estrogen receptor positive, stage 4, right ([place]) 11/16/2018   Hypoalbuminemia 11/16/2018   Pleural effusion 11/14/2018   Goals of care, counseling/discussion 11/06/2018   Malignant neoplasm of overlapping sites of right breast in female, estrogen receptor positive ([place]) 10/31/2018   Lung metastasis 10/31/2018   Pain from bone metastases ([place]) 10/31/2018   Morbid obesity with BMI of 40.0-44.9, adult ([place]) 10/31/2018   Bone injury    Pleural effusion, bilateral    Shortness of breath    Abnormal breast finding    Hypokalemia    Breast skin changes    Macrocytic anemia    Malignant neoplasm metastatic to bone ([facility])    Anemia, B12 deficiency 10/26/2018    is allergic to ferumoxytol.  MEDICAL HISTORY: Past Medical History:  Diagnosis Date   Cancer [facility])    Family history of breast cancer    Family history of colon cancer    Family history of melanoma    Family history of prostate cancer    Gallstones    Glaucoma    Mastitis    reports history of recurrent mastitis   Metastatic breast cancer    Obesity     SURGICAL HISTORY: Past Surgical History:  Procedure Laterality Date   GLAUCOMA SURGERY  late 1990's   IR THORACENTESIS ASP PLEURAL SPACE W/IMG GUIDE  10/28/2018   IR THORACENTESIS ASP PLEURAL SPACE W/IMG GUIDE  10/29/2018   OPEN  REDUCTION INTERNAL FIXATION (ORIF) DISTAL RADIAL FRACTURE Left 01/09/2019   Procedure: OPEN REDUCTION INTERNAL FIXATION (ORIF) HUMERAL FRACTURE;  Surgeon: Netta Cedars, MD;  Location: [facility];  Service: Orthopedics;  Laterality: Left;    SOCIAL HISTORY: Social History   Socioeconomic History   Marital status: Widowed    Spouse name: Not on file   Number of children: 1   Years of education: Not on file   Highest education level: Not on  file  Occupational History   Occupation: Doctor, general practice    Comment: Self-employed  Tobacco Use   Smoking status: Never   Smokeless tobacco: Never   Tobacco comments:    second hand smoke exposure  Vaping Use   Vaping Use: Never used  Substance and Sexual Activity   Alcohol use: No   Drug use: No   Sexual activity: Not Currently  Other Topics Concern   Not on file  Social History Narrative   Has a daughter named [name]. Daughter has CP.   Social Determinants of Health   Financial Resource Strain: Low Risk  (08/16/2021)   Overall Financial Resource Strain (CARDIA)    Difficulty of Paying Living Expenses: Not hard at all  Food Insecurity: Food Insecurity Present (11/08/2021)   Hunger Vital Sign    Worried About Running Out of Food in the Last Year: Sometimes true    Ran Out of Food in the Last Year: Sometimes true  Transportation Needs: No Transportation Needs (01/12/2022)   PRAPARE - Hydrologist (Medical): No    Lack of Transportation (Non-Medical): No  Physical Activity: Not on file  Stress: Stress Concern Present (11/08/2021)   [place]    Feeling of Stress : To some extent  Social Connections: Socially Isolated (03/27/2021)   Social Connection and Isolation Panel [NHANES]    Frequency of Communication with Friends and Family: Once a week    Frequency of Social Gatherings with Friends and Family: More than three times a week     Attends Religious Services: Never    Marine scientist or Organizations: No    Attends Archivist Meetings: Never    Marital Status: Widowed  Intimate Partner Violence: Not At Risk (12/24/2018)   Humiliation, Afraid, Rape, and Kick questionnaire    Fear of Current or Ex-Partner: No    Emotionally Abused: No    Physically Abused: No    Sexually Abused: No    FAMILY HISTORY: Family History  Problem Relation Age of Onset   Breast cancer Sister        in her 37's-70s   Heart disease Brother        CABG   Heart disease Brother        CABG   Skin cancer Brother        melanoma   Colon cancer Cousin    Heart attack Mother    Heart attack Father    Prostate cancer Brother     Review of Systems  Constitutional:  Negative for appetite change, chills, fatigue, fever and unexpected weight change.  HENT:   Negative for hearing loss, lump/mass and trouble swallowing.   Eyes:  Negative for eye problems and icterus.  Respiratory:  Negative for chest tightness, cough and shortness of breath.   Cardiovascular:  Negative for chest pain, leg swelling and palpitations.  Gastrointestinal:  Negative for abdominal distention, abdominal pain, constipation, diarrhea, nausea and vomiting.  Endocrine: Negative for hot flashes.  Genitourinary:  Negative for difficulty urinating.   Musculoskeletal:  Negative for arthralgias.  Skin:  Negative for itching and rash.  Neurological:  Negative for dizziness, extremity weakness, headaches and numbness.  Hematological:  Negative for adenopathy. Does not bruise/bleed easily.  Psychiatric/Behavioral:  Negative for depression. The patient is not nervous/anxious.       PHYSICAL EXAMINATION  ECOG PERFORMANCE STATUS: 1 - Symptomatic but completely ambulatory  Vitals:  03/22/22 1322  BP: 123/78  Pulse: 80  Resp: 18  Temp: (!) 97.5 F (36.4 C)  SpO2: 100%    Physical Exam Constitutional:      General: She is not in acute distress.     Appearance: Normal appearance. She is not toxic-appearing.  HENT:     Head: Normocephalic and atraumatic.  Eyes:     General: No scleral icterus. Cardiovascular:     Rate and Rhythm: Normal rate and regular rhythm.     Pulses: Normal pulses.     Heart sounds: Normal heart sounds.  Pulmonary:     Effort: Pulmonary effort is normal.     Breath sounds: Normal breath sounds.  Chest:     Comments: Slight erythema underneath her breast however largely resolved. Abdominal:     General: Abdomen is flat. Bowel sounds are normal. There is no distension.     Palpations: Abdomen is soft.     Tenderness: There is no abdominal tenderness.  Musculoskeletal:        General: No swelling.     Cervical back: Neck supple.  Lymphadenopathy:     Cervical: No cervical adenopathy.  Skin:    General: Skin is warm and dry.     Findings: No rash.  Neurological:     General: No focal deficit present.     Mental Status: She is alert.  Psychiatric:        Mood and Affect: Mood normal.        Behavior: Behavior normal.     LABORATORY DATA:  CBC    Component Value Date/Time   WBC 2.3 (L) 03/22/2022 1245   WBC 2.0 (L) 12/10/2021 0326   RBC 2.66 (L) 03/22/2022 1245   HGB 9.0 (L) 03/22/2022 1245   HCT 27.8 (L) 03/22/2022 1245   PLT 167 03/22/2022 1245   MCV 104.5 (H) 03/22/2022 1245   MCH 33.8 03/22/2022 1245   MCHC 32.4 03/22/2022 1245   RDW 20.0 (H) 03/22/2022 1245   LYMPHSABS 0.7 03/22/2022 1245   MONOABS 0.3 03/22/2022 1245   EOSABS 0.0 03/22/2022 1245   BASOSABS 0.1 03/22/2022 1245    CMP     Component Value Date/Time   NA 139 03/22/2022 1245   K 3.7 03/22/2022 1245   CL 107 03/22/2022 1245   CO2 28 03/22/2022 1245   GLUCOSE 112 (H) 03/22/2022 1245   BUN 16 03/22/2022 1245   CREATININE 0.80 03/22/2022 1245   CALCIUM 8.8 (L) 03/22/2022 1245   PROT 6.7 03/22/2022 1245   ALBUMIN 3.7 03/22/2022 1245   AST 26 03/22/2022 1245   ALT 14 03/22/2022 1245   ALKPHOS 115 03/22/2022 1245    BILITOT 0.5 03/22/2022 1245   GFRNONAA >60 03/22/2022 1245   GFRAA >60 02/24/2020 1454   GFRAA >60 07/24/2019 1453        ASSESSMENT and THERAPY PLAN:   Malignant neoplasm metastatic to bone [facility]) Bonni is a 61 year old woman with history of stage IV breast cancer with recent progression awaiting liver biopsy with treatment change to Faslodex and Xgeva.  Verlisa is doing well today.  She will proceed with her injections today.  We discussed the delay with the CT-guided biopsy and I have reached out to her radiology leadership to see if they can get this expedited.  She will return and see Dr. Chryl Heck in November for labs, follow-up, and her next injection.   All questions were answered. The patient knows to call the clinic with any problems, questions  or concerns. We can certainly see the patient much sooner if necessary.  Total encounter time:20 minutes*in face-to-face visit time, chart review, lab review, care coordination, order entry, and documentation of the encounter time.    Wilber Bihari, NP 03/26/22 3:15 PM Medical Oncology and Hematology [facility] [place], [place] [address] Tel. [phone]    Fax. [phone]  *Total Encounter Time as defined by the Centers for Medicare and Medicaid Services includes, in addition to the face-to-face time of a patient visit (documented in the note above) non-face-to-face time: obtaining and reviewing outside history, ordering and reviewing medications, tests or procedures, care coordination (communications with other health care professionals or caregivers) and documentation in the medical record.

## 2022-03-22 NOTE — Telephone Encounter (Signed)
Copied from [place] [id number]. Topic: General - Other >> Mar 22, 2022 11:01 AM Cyndi Bender wrote: Reason for CRM: Rebecka Apley with [facility] [employer] called requesting update on the 3051 form. Cb# [phone]  fax# [phone]

## 2022-03-23 ENCOUNTER — Other Ambulatory Visit (HOSPITAL_COMMUNITY): Payer: Self-pay

## 2022-03-23 DIAGNOSIS — C50811 Malignant neoplasm of overlapping sites of right female breast: Secondary | ICD-10-CM | POA: Diagnosis not present

## 2022-03-23 NOTE — Telephone Encounter (Signed)
Patients daughter called in checking status of [place] paperwork. Please call back

## 2022-03-23 NOTE — Telephone Encounter (Signed)
Needs to refax form. We do not have a from for Ms Dudgeon

## 2022-03-24 LAB — CANCER ANTIGEN 27.29: CA 27.29: 905.1 U/mL — ABNORMAL HIGH (ref 0.0–38.6)

## 2022-03-26 ENCOUNTER — Telehealth: Payer: Self-pay | Admitting: *Deleted

## 2022-03-26 ENCOUNTER — Encounter: Payer: Self-pay | Admitting: Hematology and Oncology

## 2022-03-26 ENCOUNTER — Inpatient Hospital Stay: Payer: Medicaid Other

## 2022-03-26 ENCOUNTER — Other Ambulatory Visit: Payer: Self-pay

## 2022-03-26 VITALS — BP 124/51 | HR 71 | Temp 98.5°F | Resp 18

## 2022-03-26 DIAGNOSIS — Z170 Estrogen receptor positive status [ER+]: Secondary | ICD-10-CM

## 2022-03-26 DIAGNOSIS — C7951 Secondary malignant neoplasm of bone: Secondary | ICD-10-CM

## 2022-03-26 DIAGNOSIS — Z5111 Encounter for antineoplastic chemotherapy: Secondary | ICD-10-CM | POA: Diagnosis not present

## 2022-03-26 DIAGNOSIS — C50811 Malignant neoplasm of overlapping sites of right female breast: Secondary | ICD-10-CM | POA: Diagnosis not present

## 2022-03-26 MED ORDER — FULVESTRANT 250 MG/5ML IM SOSY
500.0000 mg | PREFILLED_SYRINGE | Freq: Once | INTRAMUSCULAR | Status: AC
  Administered 2022-03-26: 500 mg via INTRAMUSCULAR
  Filled 2022-03-26: qty 10

## 2022-03-26 NOTE — Telephone Encounter (Signed)
PCS forms received from Rebecka Apley, via fax.  Forms filled out and given to P.Kipp Brood, CMA to give to PCP.

## 2022-03-26 NOTE — Telephone Encounter (Signed)
This RN completed form for personal home aid per discussion with [name] stating form was submitted to the primary MD but not done yet " I have left messages but no one from that office has called me back and if it is not received by the 31st her services will be cancelled"  Receipt of confirmation received per above and informed Berline Chough and Gay.

## 2022-03-26 NOTE — Assessment & Plan Note (Signed)
Lawren is a 61 year old woman with history of stage IV breast cancer with recent progression awaiting liver biopsy with treatment change to Faslodex and Xgeva.  Iva is doing well today.  She will proceed with her injections today.  We discussed the delay with the CT-guided biopsy and I have reached out to her radiology leadership to see if they can get this expedited.  She will return and see Dr. Chryl Heck in November for labs, follow-up, and her next injection.

## 2022-03-26 NOTE — Telephone Encounter (Signed)
Forms being re-faxed.

## 2022-03-26 NOTE — Telephone Encounter (Signed)
Forms completed and faxed.  

## 2022-03-27 ENCOUNTER — Other Ambulatory Visit: Payer: Self-pay | Admitting: Radiology

## 2022-03-27 DIAGNOSIS — K769 Liver disease, unspecified: Secondary | ICD-10-CM

## 2022-03-27 DIAGNOSIS — C50811 Malignant neoplasm of overlapping sites of right female breast: Secondary | ICD-10-CM | POA: Diagnosis not present

## 2022-03-28 ENCOUNTER — Ambulatory Visit (HOSPITAL_COMMUNITY)
Admission: RE | Admit: 2022-03-28 | Discharge: 2022-03-28 | Disposition: A | Discharge status: Home / Self Care | Payer: Medicaid Other | Source: Ambulatory Visit | Attending: Hematology and Oncology | Admitting: Hematology and Oncology

## 2022-03-28 ENCOUNTER — Encounter (HOSPITAL_COMMUNITY): Payer: Self-pay

## 2022-03-28 ENCOUNTER — Other Ambulatory Visit: Payer: Self-pay

## 2022-03-28 DIAGNOSIS — K769 Liver disease, unspecified: Secondary | ICD-10-CM | POA: Diagnosis not present

## 2022-03-28 DIAGNOSIS — C50811 Malignant neoplasm of overlapping sites of right female breast: Secondary | ICD-10-CM | POA: Diagnosis present

## 2022-03-28 DIAGNOSIS — C50919 Malignant neoplasm of unspecified site of unspecified female breast: Secondary | ICD-10-CM | POA: Diagnosis not present

## 2022-03-28 DIAGNOSIS — C787 Secondary malignant neoplasm of liver and intrahepatic bile duct: Secondary | ICD-10-CM | POA: Insufficient documentation

## 2022-03-28 DIAGNOSIS — C7801 Secondary malignant neoplasm of right lung: Secondary | ICD-10-CM | POA: Diagnosis not present

## 2022-03-28 DIAGNOSIS — S22000A Wedge compression fracture of unspecified thoracic vertebra, initial encounter for closed fracture: Secondary | ICD-10-CM | POA: Diagnosis not present

## 2022-03-28 DIAGNOSIS — K7689 Other specified diseases of liver: Secondary | ICD-10-CM | POA: Diagnosis not present

## 2022-03-28 DIAGNOSIS — Z170 Estrogen receptor positive status [ER+]: Secondary | ICD-10-CM | POA: Insufficient documentation

## 2022-03-28 DIAGNOSIS — R0602 Shortness of breath: Secondary | ICD-10-CM | POA: Diagnosis not present

## 2022-03-28 DIAGNOSIS — J9621 Acute and chronic respiratory failure with hypoxia: Secondary | ICD-10-CM | POA: Diagnosis not present

## 2022-03-28 LAB — CBC
HCT: 30.1 % — ABNORMAL LOW (ref 36.0–46.0)
Hemoglobin: 9.6 g/dL — ABNORMAL LOW (ref 12.0–15.0)
MCH: 33.2 pg (ref 26.0–34.0)
MCHC: 31.9 g/dL (ref 30.0–36.0)
MCV: 104.2 fL — ABNORMAL HIGH (ref 80.0–100.0)
Platelets: 286 10*3/uL (ref 150–400)
RBC: 2.89 MIL/uL — ABNORMAL LOW (ref 3.87–5.11)
RDW: 18.6 % — ABNORMAL HIGH (ref 11.5–15.5)
WBC: 3.4 10*3/uL — ABNORMAL LOW (ref 4.0–10.5)
nRBC: 0 % (ref 0.0–0.2)

## 2022-03-28 LAB — PROTIME-INR
INR: 1 (ref 0.8–1.2)
Prothrombin Time: 12.9 seconds (ref 11.4–15.2)

## 2022-03-28 MED ORDER — MIDAZOLAM HCL 2 MG/2ML IJ SOLN
INTRAMUSCULAR | Status: AC
  Filled 2022-03-28: qty 2

## 2022-03-28 MED ORDER — GELATIN ABSORBABLE 12-7 MM EX MISC
CUTANEOUS | Status: AC
  Filled 2022-03-28: qty 1

## 2022-03-28 MED ORDER — LIDOCAINE HCL (PF) 1 % IJ SOLN: 10.0000 mL | Freq: Once | INTRAMUSCULAR | Status: DC

## 2022-03-28 MED ORDER — LIDOCAINE HCL (PF) 1 % IJ SOLN
INTRAMUSCULAR | Status: AC
  Administered 2022-03-28: 10 mL via INTRADERMAL
  Filled 2022-03-28: qty 30

## 2022-03-28 MED ORDER — SODIUM CHLORIDE 0.9 % IV SOLN: INTRAVENOUS | Status: DC

## 2022-03-28 MED ORDER — HYDROCODONE-ACETAMINOPHEN 5-325 MG PO TABS: 1.0000 | ORAL_TABLET | ORAL | Status: DC | PRN

## 2022-03-28 MED ORDER — FENTANYL CITRATE (PF) 100 MCG/2ML IJ SOLN
INTRAMUSCULAR | Status: AC
  Filled 2022-03-28: qty 2

## 2022-03-28 MED ORDER — SODIUM CHLORIDE 0.9 % IV SOLN
INTRAVENOUS | Status: AC | PRN
  Administered 2022-03-28: 10 mL/h via INTRAVENOUS

## 2022-03-28 MED ORDER — FENTANYL CITRATE (PF) 100 MCG/2ML IJ SOLN
INTRAMUSCULAR | Status: AC | PRN
  Administered 2022-03-28 (×2): 25 ug via INTRAVENOUS

## 2022-03-28 MED ORDER — MIDAZOLAM HCL 2 MG/2ML IJ SOLN
INTRAMUSCULAR | Status: AC | PRN
  Administered 2022-03-28 (×2): .5 mg via INTRAVENOUS

## 2022-03-28 NOTE — Sedation Documentation (Signed)
SBP when positioned with arm over head reads false low in 80's to 90's. She has a plate in her left arm and states it reads false (55'O Systolic when taken on that arm now) Dr Anselm Pancoast aware. Will take BP on right.

## 2022-03-28 NOTE — H&P (Signed)
Chief Complaint: Patient was seen in consultation today for biopsy of liver lesion vs left axillary lymph node at the request of Dr Lonell Face  Referring Physician(s): Benay Pike  Supervising Physician: Markus Daft  Patient Status: [facility] - Out-pt  History of Present Illness: Suzanne [name] is a 61 y.o. female    Known Breast Cancer 2020 Follows with Dr Chryl Heck "Numbers have been rising" in last several months  CT 03/05/22: IMPRESSION: 1. Extensive hepatic metastatic disease. New pleural metastatic disease. Left axillary lymph node has increased slightly in size in the interval, also worrisome for metastatic involvement. 2. Diffuse osseous metastatic disease, as before. 3. Moderate to large hiatal hernia. 4. Cholelithiasis. 5. Left renal stone. 6. Trace bilateral pleural fluid. 7. Aortic atherosclerosis (ICD10-I70.0). Coronary artery calcification.  Scheduled now for liver lesion biopsy vs left axillary lymph node biopsy   Past Medical History:  Diagnosis Date   Cancer [facility])    Family history of breast cancer    Family history of colon cancer    Family history of melanoma    Family history of prostate cancer    Gallstones    Glaucoma    Mastitis    reports history of recurrent mastitis   Metastatic breast cancer    Obesity     Past Surgical History:  Procedure Laterality Date   GLAUCOMA SURGERY  late 1990's   IR THORACENTESIS ASP PLEURAL SPACE W/IMG GUIDE  10/28/2018   IR THORACENTESIS ASP PLEURAL SPACE W/IMG GUIDE  10/29/2018   OPEN REDUCTION INTERNAL FIXATION (ORIF) DISTAL RADIAL FRACTURE Left 01/09/2019   Procedure: OPEN REDUCTION INTERNAL FIXATION (ORIF) HUMERAL FRACTURE;  Surgeon: Netta Cedars, MD;  Location: [facility] ORS;  Service: Orthopedics;  Laterality: Left;    Allergies: Ferumoxytol  Medications: Prior to Admission medications   Medication Sig Start Date End Date Taking? Authorizing Provider  acetaminophen (TYLENOL) 500 MG tablet Take 1,000 mg by  mouth every 6 (six) hours as needed for mild pain.   Yes [provider]  cetirizine (ZYRTEC) 10 MG tablet Take 10 mg by mouth daily.   Yes [provider]  famotidine (PEPCID) 20 MG tablet Take 1 tablet (20 mg total) by mouth 2 (two) times daily as needed for heartburn or indigestion. 07/28/20  Yes [name], Virgie Dad, MD  folic acid (FOLVITE) 1 MG tablet Take 1 tablet (1 mg total) by mouth daily. 09/29/21  Yes Benay Pike, MD  furosemide (LASIX) 20 MG tablet Take 1 tablet (20 mg total) by mouth daily. 09/29/21  Yes Benay Pike, MD  Ibuprofen-Acetaminophen (ADVIL DUAL ACTION) 125-250 MG TABS Take 2 tablets by mouth daily as needed (pain).   Yes [provider]  ketoconazole (NIZORAL) 2 % cream Apply topically daily. Patient taking differently: Apply 1 Application topically daily as needed for irritation. 12/11/21  Yes Georgette Shell, MD  MELATONIN TR 1 MG TBCR Take 1 mg by mouth at bedtime. 07/06/21  Yes [provider]  ondansetron (ZOFRAN) 8 MG tablet Take 1 tablet (8 mg total) by mouth every 8 (eight) hours as needed for nausea or vomiting. 02/26/22  Yes Iruku, Arletha Pili, MD  OYSTER SHELL CALCIUM PLUS D 500-5 MG-MCG TABS TAKE ONE TABLET BY MOUTH EVERY MORNING and TAKE ONE TABLET BY MOUTH AT NOON and TAKE ONE TABLET BY MOUTH EVERY EVENING Patient taking differently: Take 1 tablet by mouth 2 (two) times daily. 04/27/21  Yes [name], Virgie Dad, MD  sertraline (ZOLOFT) 50 MG tablet TAKE 1 AND 1/2 TABLETS BY  MOUTH EVERYDAY AT BEDTIME 12/25/21  Yes Newlin, Charlane Ferretti, MD  Blood Pressure Monitor MISC Medium sized cuff. Use to check blood pressure twice per day and more often if not feeling well. I10.0 07/19/20   [name], NP  doxycycline (VIBRA-TABS) 100 MG tablet Take 1 tablet (100 mg total) by mouth 2 (two) times daily. Patient not taking: Reported on 03/22/2022 02/16/22   Benay Pike, MD  nystatin (MYCOSTATIN/NYSTOP) powder Apply 1 application topically as  needed. Patient taking differently: Apply 1 application  topically daily as needed (skin irritation). 07/22/20   Gardenia Phlegm, NP  polyethylene glycol (MIRALAX / GLYCOLAX) 17 g packet Take 17 g by mouth as needed. Patient taking differently: Take 17 g by mouth daily as needed for mild constipation. 07/28/20   [name], Virgie Dad, MD     Family History  Problem Relation Age of Onset   Breast cancer Sister        in her 25's-70s   Heart disease Brother        CABG   Heart disease Brother        CABG   Skin cancer Brother        melanoma   Colon cancer Cousin    Heart attack Mother    Heart attack Father    Prostate cancer Brother     Social History   Socioeconomic History   Marital status: Widowed    Spouse name: Not on file   Number of children: 1   Years of education: Not on file   Highest education level: Not on file  Occupational History   Occupation: Clinical research associate Programs    Comment: Self-employed  Tobacco Use   Smoking status: Never   Smokeless tobacco: Never   Tobacco comments:    second hand smoke exposure  Vaping Use   Vaping Use: Never used  Substance and Sexual Activity   Alcohol use: No   Drug use: No   Sexual activity: Not Currently  Other Topics Concern   Not on file  Social History Narrative   Has a daughter named [name]. Daughter has CP.   Social Determinants of Health   Financial Resource Strain: Low Risk  (08/16/2021)   Overall Financial Resource Strain (CARDIA)    Difficulty of Paying Living Expenses: Not hard at all  Food Insecurity: Food Insecurity Present (11/08/2021)   Hunger Vital Sign    Worried About Running Out of Food in the Last Year: Sometimes true    Ran Out of Food in the Last Year: Sometimes true  Transportation Needs: No Transportation Needs (01/12/2022)   PRAPARE - Hydrologist (Medical): No    Lack of Transportation (Non-Medical): No  Physical Activity: Not on file  Stress: Stress  Concern Present (11/08/2021)   [place]    Feeling of Stress : To some extent  Social Connections: Socially Isolated (03/27/2021)   Social Connection and Isolation Panel [NHANES]    Frequency of Communication with Friends and Family: Once a week    Frequency of Social Gatherings with Friends and Family: More than three times a week    Attends Religious Services: Never    Marine scientist or Organizations: No    Attends Archivist Meetings: Never    Marital Status: Widowed    Review of Systems: A 12 point ROS discussed and pertinent positives are indicated in the HPI above.  All other systems  are negative.  Review of Systems  Constitutional:  Negative for activity change, fatigue, fever and unexpected weight change.  Respiratory:  Negative for cough and shortness of breath.   Cardiovascular:  Negative for chest pain.  Gastrointestinal:  Negative for abdominal pain.  Musculoskeletal:  Negative for back pain.  Psychiatric/Behavioral:  Negative for behavioral problems and confusion.     Vital Signs: BP 127/81   Pulse 96   Temp 98.2 F (36.8 C) (Oral)   Resp 18   Ht '4\' 10"'$  (1.473 m)   Wt 178 lb (80.7 kg)   LMP 05/28/2013 (Within Months)   SpO2 98%   BMI 37.20 kg/m     Physical Exam Vitals reviewed.  HENT:     Mouth/Throat:     Mouth: Mucous membranes are moist.  Cardiovascular:     Rate and Rhythm: Normal rate and regular rhythm.     Heart sounds: Normal heart sounds.  Pulmonary:     Effort: Pulmonary effort is normal.     Breath sounds: Normal breath sounds.  Abdominal:     Palpations: Abdomen is soft.  Musculoskeletal:        General: Normal range of motion.  Skin:    General: Skin is warm.  Neurological:     Mental Status: She is alert and oriented to person, place, and time.  Psychiatric:        Behavior: Behavior normal.     Imaging: CT CHEST ABDOMEN PELVIS W  CONTRAST  Result Date: 03/07/2022 CLINICAL DATA:  Stage IV breast cancer, ongoing oral chemotherapy. * Tracking Code: BO * EXAM: CT CHEST, ABDOMEN, AND PELVIS WITH CONTRAST TECHNIQUE: Multidetector CT imaging of the chest, abdomen and pelvis was performed following the standard protocol during bolus administration of intravenous contrast. RADIATION DOSE REDUCTION: This exam was performed according to the departmental dose-optimization program which includes automated exposure control, adjustment of the mA and/or kV according to patient size and/or use of iterative reconstruction technique. CONTRAST:  116m OMNIPAQUE IOHEXOL 300 MG/ML  SOLN COMPARISON:  12/07/2021. FINDINGS: CT CHEST FINDINGS Cardiovascular: Atherosclerotic calcification of the aorta, aortic valve and coronary arteries. No pericardial effusion. Mediastinum/Nodes: No pathologically enlarged mediastinal, hilar or right axillary lymph nodes. Left axillary lymph node measures 13 mm (2/12), previously 11 mm. Lungs/Pleura: Scattered subsegmental volume loss in the right lung. Slight pleural nodularity in the lower hemithoraces, new from 12/07/2021. Subpleural volume loss or pleural thickening in the posterior left lower lobe. Trace bilateral pleural fluid. Airway is unremarkable. Musculoskeletal: Diffuse mild sclerosis with old bilateral rib fractures. T11 and T12 compression fractures with kyphosis, unchanged. CT ABDOMEN PELVIS FINDINGS Hepatobiliary: Numerous new mildly hypoattenuating lesions in the liver. Index lesion in the dome measures 14 mm (2/24). Stones in the gallbladder. No biliary ductal dilatation. Pancreas: Negative. Spleen: Negative. Adrenals/Urinary Tract: Adrenal glands and right kidney are unremarkable. Small stone in the left kidney. Ureters are decompressed. Bladder is grossly unremarkable. Stomach/Bowel: Moderate to large hiatal hernia. Stomach, small bowel, appendix and colon are otherwise unremarkable. Vascular/Lymphatic:  Atherosclerotic calcification of the aorta. No pathologically enlarged lymph nodes. Reproductive: Uterus is visualized.  No adnexal mass. Other: No free fluid.  Mesenteries and peritoneum are unremarkable. Musculoskeletal: Mottled sclerosis throughout the visualized osseous structures. IMPRESSION: 1. Extensive hepatic metastatic disease. New pleural metastatic disease. Left axillary lymph node has increased slightly in size in the interval, also worrisome for metastatic involvement. 2. Diffuse osseous metastatic disease, as before. 3. Moderate to large hiatal hernia. 4. Cholelithiasis. 5. Left renal stone.  6. Trace bilateral pleural fluid. 7. Aortic atherosclerosis (ICD10-I70.0). Coronary artery calcification. Electronically Signed   By: Lorin Picket M.D.   On: 03/07/2022 09:19    Labs:  CBC: Recent Labs    02/21/22 1336 03/09/22 1315 03/22/22 1245 03/28/22 0636  WBC 7.3 3.5* 2.3* 3.4*  HGB 9.8* 9.2* 9.0* 9.6*  HCT 30.8* 28.4* 27.8* 30.1*  PLT 277 211 167 286    COAGS: Recent Labs    12/06/21 1420 03/28/22 0636  INR 1.1 1.0  APTT 34  --     BMP: Recent Labs    01/23/22 1036 02/21/22 1336 03/09/22 1315 03/22/22 1245  NA 141 140 140 139  K 3.5 3.2* 4.0 3.7  CL 105 104 105 107  CO2 32 '27 31 28  '$ GLUCOSE 112* 105* 118* 112*  BUN '13 19 17 16  '$ CALCIUM 8.9 9.2 9.4 8.8*  CREATININE 0.65 0.75 1.12* 0.80  GFRNONAA >60 >60 55* >60    LIVER FUNCTION TESTS: Recent Labs    01/23/22 1036 02/21/22 1336 03/09/22 1315 03/22/22 1245  BILITOT 0.5 0.6 0.6 0.5  AST 23 40 27 26  ALT '22 25 13 14  '$ ALKPHOS 95 133* 122 115  PROT 6.7 7.1 7.2 6.7  ALBUMIN 3.7 3.5 4.0 3.7    TUMOR MARKERS: No results for input(s): "AFPTM", "CEA", "CA199", "CHROMGRNA" in the last 8760 hours.  Assessment and Plan:  Breast cancer history 2020 New findings on follow up CT 03/05/22 Scheduled now for liver lesion biopsy vs left axillary lymph node biopsy Risks and benefits of liver lesion biopsy vs  left axillary lymph node biopsy was discussed with the patient and/or patient's family including, but not limited to bleeding, infection, damage to adjacent structures or low yield requiring additional tests.  All of the questions were answered and there is agreement to proceed. Consent signed and in chart.  Thank you for this interesting consult.  I greatly enjoyed meeting Suzanne [name] and look forward to participating in their care.  A copy of this report was sent to the requesting provider on this date.  Electronically Signed: Lavonia Drafts, PA-C 03/28/2022, 7:13 AM   I spent a total of  30 Minutes   in face to face in clinical consultation, greater than 50% of which was counseling/coordinating care for liver lesion biopsy vs left axillary LN bx

## 2022-03-28 NOTE — Progress Notes (Signed)
Patient and daughter was given discharge instructions. Both verbalized understanding. 

## 2022-03-29 DIAGNOSIS — C50811 Malignant neoplasm of overlapping sites of right female breast: Secondary | ICD-10-CM | POA: Diagnosis not present

## 2022-03-30 DIAGNOSIS — C50811 Malignant neoplasm of overlapping sites of right female breast: Secondary | ICD-10-CM | POA: Diagnosis not present

## 2022-04-02 ENCOUNTER — Encounter: Payer: Self-pay | Admitting: Adult Health

## 2022-04-02 ENCOUNTER — Telehealth: Payer: Self-pay | Admitting: *Deleted

## 2022-04-02 DIAGNOSIS — C50811 Malignant neoplasm of overlapping sites of right female breast: Secondary | ICD-10-CM | POA: Diagnosis not present

## 2022-04-02 MED ORDER — TRIAMCINOLONE ACETONIDE 0.5 % EX OINT: 1.0000 | TOPICAL_OINTMENT | Freq: Two times a day (BID) | CUTANEOUS | 0 refills | Status: DC

## 2022-04-02 NOTE — Telephone Encounter (Signed)
See other note

## 2022-04-03 ENCOUNTER — Other Ambulatory Visit: Payer: Self-pay | Admitting: Hematology and Oncology

## 2022-04-03 DIAGNOSIS — C50811 Malignant neoplasm of overlapping sites of right female breast: Secondary | ICD-10-CM | POA: Diagnosis not present

## 2022-04-04 DIAGNOSIS — C50811 Malignant neoplasm of overlapping sites of right female breast: Secondary | ICD-10-CM | POA: Diagnosis not present

## 2022-04-05 ENCOUNTER — Ambulatory Visit (HOSPITAL_COMMUNITY): Payer: Medicaid Other

## 2022-04-06 ENCOUNTER — Other Ambulatory Visit: Payer: Self-pay

## 2022-04-06 ENCOUNTER — Encounter: Payer: Self-pay | Admitting: Adult Health

## 2022-04-06 ENCOUNTER — Inpatient Hospital Stay: Payer: Medicaid Other | Attending: Adult Health | Admitting: Adult Health

## 2022-04-06 VITALS — BP 107/73 | HR 81 | Temp 97.5°F | Resp 18 | Ht <= 58 in

## 2022-04-06 DIAGNOSIS — R21 Rash and other nonspecific skin eruption: Secondary | ICD-10-CM | POA: Diagnosis not present

## 2022-04-06 DIAGNOSIS — C7951 Secondary malignant neoplasm of bone: Secondary | ICD-10-CM | POA: Diagnosis not present

## 2022-04-06 DIAGNOSIS — Z800 Family history of malignant neoplasm of digestive organs: Secondary | ICD-10-CM | POA: Insufficient documentation

## 2022-04-06 DIAGNOSIS — C50811 Malignant neoplasm of overlapping sites of right female breast: Secondary | ICD-10-CM | POA: Diagnosis present

## 2022-04-06 DIAGNOSIS — Z170 Estrogen receptor positive status [ER+]: Secondary | ICD-10-CM | POA: Diagnosis not present

## 2022-04-06 DIAGNOSIS — Z803 Family history of malignant neoplasm of breast: Secondary | ICD-10-CM | POA: Diagnosis not present

## 2022-04-06 DIAGNOSIS — Z808 Family history of malignant neoplasm of other organs or systems: Secondary | ICD-10-CM | POA: Diagnosis not present

## 2022-04-06 DIAGNOSIS — Z5111 Encounter for antineoplastic chemotherapy: Secondary | ICD-10-CM | POA: Insufficient documentation

## 2022-04-06 DIAGNOSIS — L304 Erythema intertrigo: Secondary | ICD-10-CM

## 2022-04-06 DIAGNOSIS — C787 Secondary malignant neoplasm of liver and intrahepatic bile duct: Secondary | ICD-10-CM | POA: Insufficient documentation

## 2022-04-06 DIAGNOSIS — Z79899 Other long term (current) drug therapy: Secondary | ICD-10-CM | POA: Insufficient documentation

## 2022-04-06 MED ORDER — FLUCONAZOLE 200 MG PO TABS: 200.0000 mg | ORAL_TABLET | Freq: Every day | ORAL | 0 refills | Status: DC

## 2022-04-06 MED ORDER — METHYLPREDNISOLONE 4 MG PO TBPK: ORAL_TABLET | ORAL | 0 refills | Status: DC

## 2022-04-06 NOTE — Assessment & Plan Note (Addendum)
Her rash has continued to worsen.  I placed orders for Medrol Dosepak, fluconazole orally, I recommended that she apply the Nizoral cream to the area and also placed a referral to dermatology for follow-up and evaluation.  I sent a note note over to [facility] dermatology that included the patient's pictures from above in hopes that they will please see this patient quickly.

## 2022-04-06 NOTE — Assessment & Plan Note (Signed)
On treatment with Faslodex.  She is tolerating this well and her recent liver biopsy showed metastatic ER positive breast cancer, foundation 1 testing is pending.  She has follow-up with Dr. Chryl Heck on November 22 for labs, evaluation, and her next injection.

## 2022-04-06 NOTE — Progress Notes (Signed)
[facility] Follow up:    Suzanne Pounds, NP [address] [place] [address]   DIAGNOSIS: Metastatic breast cancer  SUMMARY OF ONCOLOGIC HISTORY: [place], [place] woman presenting 10/26/2018 with right-sided inflammatory breast cancer, stage IV, involving lungs, lymph nodes and bones, as follows:             (a) chest CT scan 10/26/2018 shows bilateral pleural effusions, possible lymphangitic spread of tumor, diffuse bony metastatic disease, and significant axillary mediastinal and hilar adenopathy             (b) bone scan 10/27/2018 is a "near SuperScan" consistent with widespread bony metastatic disease             (c) head CT with and without contrast 10/30/2018 shows no intracranial metastatic disease, multiple calvarial lesions             (d) CA-27-29 on 10/27/2018 was 1810.4   (1) pleural fluid from right thoracentesis 10/28/2018 confirms malignant cells consistent with a breast primary, strongly estrogen and progesterone receptor positive, HER-2 not amplified, with an MIB-1 of 2%   (2) anastrozole started 10/29/2018             (a) palbociclib started 11/13/2018 at 100 mg dose             (b) denosumab/xgeva started 11/13/2018              (C) palbociclib dose decreased to 3m             (D) Palbociclib dose decreased to 75 mg every other day in 11/2021 (E) Palbociclib on hold beginning 01/08/2022  (3) associated problems:             (a) hypoxia secondary to effusions             (b) pain from bone lesions and left humeral and vertebral compression fractures             (c) right upper extremity lymphedema             (d) poor venous access   (4) genetics testing on 12/25/2018 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 STK11, TP53, TSC1, TSC2, and VHL.  The following genes were evaluated for sequence changes only: SDHA and HOXB13 c.251G>A variant only. The [facility] Melanoma Panel analyzed the following 9 genes: BAP1 BRCA2 CDK4 CDKN2A MITF POT1 PTEN RB1 Tp53.    (5) palliative radiation to the left humerus 07/20/2019 - 07/31/2019 Site Technique Total Dose (Gy) Dose per Fx (Gy) Completed Fx Beam Energies  Humerus, Left: Ext_Lt_humerus Complex 30/30 3 10/10 6X    (6) anemia requiring transfusion:              (a) B-12 level 134 FEB 2022: B-12 injectons started              (B) iron deficient, IV iron to start 2/6  CURRENT THERAPY: Faslodex  INTERVAL HISTORY: Suzanne [id number].[id number]. female  returns for f/u and evaluation of a rash that has been worsening and spreading all over her body.  She has a history of intertrigo that she has been struggling with and she notes that the rash improves when she is taking the fluconazole.  I sent in a triamcinolone cream for her to apply to her back which she tells me she has not noted a significant difference with her rash since receiving this.  She tells me that the rash does not itch and it is not painful.   Patient Active Problem List   Diagnosis Date Noted   Intertrigo 01/10/2022   Bacterial vaginitis 01/10/2022   Sepsis due to cellulitis ([place]) 12/06/2021   B12 deficiency anemia 08/15/2020   Sensorineural hearing loss (SNHL) of both ears 08/07/2019   Intractable back pain 02/03/2019   Thoracic compression fracture, closed, initial encounter ([place]) 02/03/2019   Essential hypertension 02/03/2019   Genetic testing 01/27/2019   Acute pharyngitis     Bacteremia    FTT (failure to thrive) in adult    Fracture closed, humerus, shaft 01/09/2019   Pneumonia 12/25/2018   Acute respiratory disorder in immunocompromised patient ([place]) 12/25/2018   Family history of breast cancer    Family history of prostate cancer    Family history of melanoma    Family history of colon cancer    Pressure injury of skin 11/24/2018   Chronic respiratory failure with hypoxia and hypercapnia ([place]) 11/19/2018   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   Hypophosphatemia 11/19/2018   Hypocalcemia 11/19/2018   Aspiration pneumonia (HCC) 11/19/2018   SOB (shortness of breath)    Palliative care by specialist    Carcinoma of breast, estrogen receptor positive, stage 4, right ([place]) 11/16/2018   Hypoalbuminemia 11/16/2018   Pleural effusion 11/14/2018   Goals of care, counseling/discussion 11/06/2018   Malignant neoplasm of overlapping sites of right breast in female, estrogen receptor positive ([place]) 10/31/2018   Lung metastasis 10/31/2018   Pain from bone metastases ([place]) 10/31/2018   Morbid obesity with BMI of 40.0-44.9, adult ([place]) 10/31/2018   Bone injury    Pleural effusion, bilateral    Shortness of breath    Abnormal breast finding    Hypokalemia    Breast skin changes    Macrocytic anemia    Malignant neoplasm metastatic to bone (HCC)    Anemia, B12 deficiency 10/26/2018    is allergic to ferumoxytol.  MEDICAL HISTORY: Past Medical History:  Diagnosis Date   Cancer [facility])    Family history of breast cancer    Family history of colon cancer    Family history of melanoma    Family history of prostate cancer    Gallstones    Glaucoma    Mastitis    reports history of recurrent mastitis   Metastatic breast cancer    Obesity     SURGICAL HISTORY: Past Surgical History:  Procedure Laterality Date   GLAUCOMA SURGERY  late 1990's   IR THORACENTESIS ASP PLEURAL SPACE W/IMG GUIDE  10/28/2018   IR THORACENTESIS ASP PLEURAL SPACE W/IMG GUIDE   10/29/2018   OPEN REDUCTION INTERNAL FIXATION (ORIF) DISTAL RADIAL FRACTURE Left 01/09/2019   Procedure: OPEN REDUCTION INTERNAL FIXATION (ORIF) HUMERAL FRACTURE;  Surgeon: Netta Cedars, MD;  Location: [facility];  Service: Orthopedics;  Laterality: Left;    SOCIAL HISTORY: Social History   Socioeconomic History   Marital status: Widowed    Spouse name: Not on file   Number of children: 1   Years of  education: Not on file   Highest education level: Not on file  Occupational History   Occupation: Clinical research associate Programs    Comment: Self-employed  Tobacco Use   Smoking status: Never   Smokeless tobacco: Never   Tobacco comments:    second hand smoke exposure  Vaping Use   Vaping Use: Never used  Substance and Sexual Activity   Alcohol use: No   Drug use: No   Sexual activity: Not Currently  Other Topics Concern   Not on file  Social History Narrative   Has a daughter named [name]. Daughter has CP.   Social Determinants of Health   Financial Resource Strain: Low Risk  (08/16/2021)   Overall Financial Resource Strain (CARDIA)    Difficulty of Paying Living Expenses: Not hard at all  Food Insecurity: Food Insecurity Present (11/08/2021)   Hunger Vital Sign    Worried About Running Out of Food in the Last Year: Sometimes true    Ran Out of Food in the Last Year: Sometimes true  Transportation Needs: No Transportation Needs (01/12/2022)   PRAPARE - Hydrologist (Medical): No    Lack of Transportation (Non-Medical): No  Physical Activity: Not on file  Stress: Stress Concern Present (11/08/2021)   [place]    Feeling of Stress : To some extent  Social Connections: Socially Isolated (03/27/2021)   Social Connection and Isolation Panel [NHANES]    Frequency of Communication with Friends and Family: Once a week    Frequency of Social Gatherings with Friends and Family: More than three  times a week    Attends Religious Services: Never    Marine scientist or Organizations: No    Attends Archivist Meetings: Never    Marital Status: Widowed  Intimate Partner Violence: Not At Risk (12/24/2018)   Humiliation, Afraid, Rape, and Kick questionnaire    Fear of Current or Ex-Partner: No    Emotionally Abused: No    Physically Abused: No    Sexually Abused: No    FAMILY HISTORY: Family History  Problem Relation Age of Onset   Breast cancer Sister        in her 5's-70s   Heart disease Brother        CABG   Heart disease Brother        CABG   Skin cancer Brother        melanoma   Colon cancer Cousin    Heart attack Mother    Heart attack Father    Prostate cancer Brother     Review of Systems  Constitutional:  Negative for appetite change, chills, fatigue, fever and unexpected weight change.  HENT:   Negative for hearing loss, lump/mass and trouble swallowing.   Eyes:  Negative for eye problems and icterus.  Respiratory:  Negative for chest tightness, cough and shortness of breath.   Cardiovascular:  Negative for chest pain, leg swelling and palpitations.  Gastrointestinal:  Negative for abdominal distention, abdominal pain, constipation, diarrhea, nausea and vomiting.  Endocrine: Negative for hot flashes.  Genitourinary:  Negative for difficulty urinating.   Musculoskeletal:  Negative for arthralgias.  Skin:  Positive for rash. Negative for itching.  Neurological:  Negative for dizziness, extremity weakness, headaches and numbness.  Hematological:  Negative for adenopathy. Does not bruise/bleed easily.  Psychiatric/Behavioral:  Negative for depression. The patient is not nervous/anxious.       PHYSICAL EXAMINATION  ECOG  PERFORMANCE STATUS: 2 - Symptomatic, <50% confined to bed  Vitals:   04/06/22 1037  BP: 107/73  Pulse: 81  Resp: 18  Temp: (!) 97.5 F (36.4 C)  SpO2: 100%    Physical Exam Constitutional:      General: She is not  in acute distress.    Appearance: Normal appearance. She is not toxic-appearing.  HENT:     Head: Normocephalic and atraumatic.  Eyes:     General: No scleral icterus. Cardiovascular:     Rate and Rhythm: Normal rate and regular rhythm.     Pulses: Normal pulses.     Heart sounds: Normal heart sounds.  Pulmonary:     Effort: Pulmonary effort is normal.     Breath sounds: Normal breath sounds.  Abdominal:     General: Abdomen is flat. Bowel sounds are normal. There is no distension.     Palpations: Abdomen is soft.     Tenderness: There is no abdominal tenderness.  Musculoskeletal:        General: No swelling.     Cervical back: Neck supple.  Lymphadenopathy:     Cervical: No cervical adenopathy.  Skin:    General: Skin is warm and dry.     Findings: Rash present.     Comments: Diffuse rash please see pictures below.  In some of the rash areas where the lesions appear to be papular they are dry and do not have any fluid underneath them.  Neurological:     General: No focal deficit present.     Mental Status: She is alert.  Psychiatric:        Mood and Affect: Mood normal.        Behavior: Behavior normal.         LABORATORY DATA: None for this visit    ASSESSMENT and THERAPY PLAN:   Malignant neoplasm metastatic to bone ([place]) On treatment with Faslodex.  She is tolerating this well and her recent liver biopsy showed metastatic ER positive breast cancer, foundation 1 testing is pending.  She has follow-up with Dr. Chryl Heck on November 22 for labs, evaluation, and her next injection.  Intertrigo Her rash has continued to worsen.  I placed orders for Medrol Dosepak, fluconazole orally, I recommended that she apply the Nizoral cream to the area and also placed a referral to dermatology for follow-up and evaluation.  I sent a note note over to [facility] dermatology that included the patient's pictures from above in hopes that they will please see this patient  quickly.   All questions were answered. The patient knows to call the clinic with any problems, questions or concerns. We can certainly see the patient much sooner if necessary.  Total encounter time:30 minutes*in face-to-face visit time, chart review, lab review, care coordination, order entry, and documentation of the encounter time.    Wilber Bihari, NP 04/06/22 4:26 PM Medical Oncology and Hematology [facility] [place], [place] [address] Tel. [phone]    Fax. [phone]  *Total Encounter Time as defined by the Centers for Medicare and Medicaid Services includes, in addition to the face-to-face time of a patient visit (documented in the note above) non-face-to-face time: obtaining and reviewing outside history, ordering and reviewing medications, tests or procedures, care coordination (communications with other health care professionals or caregivers) and documentation in the medical record.

## 2022-04-09 DIAGNOSIS — C50811 Malignant neoplasm of overlapping sites of right female breast: Secondary | ICD-10-CM | POA: Diagnosis not present

## 2022-04-10 DIAGNOSIS — C50811 Malignant neoplasm of overlapping sites of right female breast: Secondary | ICD-10-CM | POA: Diagnosis not present

## 2022-04-11 DIAGNOSIS — C50811 Malignant neoplasm of overlapping sites of right female breast: Secondary | ICD-10-CM | POA: Diagnosis not present

## 2022-04-12 ENCOUNTER — Encounter (HOSPITAL_COMMUNITY): Payer: Self-pay

## 2022-04-12 DIAGNOSIS — C50811 Malignant neoplasm of overlapping sites of right female breast: Secondary | ICD-10-CM | POA: Diagnosis not present

## 2022-04-13 DIAGNOSIS — C50811 Malignant neoplasm of overlapping sites of right female breast: Secondary | ICD-10-CM | POA: Diagnosis not present

## 2022-04-16 DIAGNOSIS — C50811 Malignant neoplasm of overlapping sites of right female breast: Secondary | ICD-10-CM | POA: Diagnosis not present

## 2022-04-17 ENCOUNTER — Ambulatory Visit: Payer: Medicaid Other | Admitting: Adult Health

## 2022-04-17 ENCOUNTER — Observation Stay (HOSPITAL_COMMUNITY)
Admission: EM | Admit: 2022-04-17 | Discharge: 2022-04-19 | Disposition: A | Discharge status: Home / Self Care | Payer: Medicaid Other | Attending: Internal Medicine | Admitting: Internal Medicine

## 2022-04-17 ENCOUNTER — Emergency Department (HOSPITAL_COMMUNITY): Payer: Medicaid Other

## 2022-04-17 ENCOUNTER — Other Ambulatory Visit: Payer: Self-pay

## 2022-04-17 ENCOUNTER — Encounter (HOSPITAL_COMMUNITY): Payer: Self-pay

## 2022-04-17 ENCOUNTER — Ambulatory Visit: Payer: Medicaid Other

## 2022-04-17 ENCOUNTER — Telehealth: Payer: Self-pay | Admitting: *Deleted

## 2022-04-17 ENCOUNTER — Inpatient Hospital Stay: Payer: Medicaid Other

## 2022-04-17 DIAGNOSIS — R0602 Shortness of breath: Secondary | ICD-10-CM | POA: Diagnosis not present

## 2022-04-17 DIAGNOSIS — C7802 Secondary malignant neoplasm of left lung: Secondary | ICD-10-CM | POA: Diagnosis present

## 2022-04-17 DIAGNOSIS — R6510 Systemic inflammatory response syndrome (SIRS) of non-infectious origin without acute organ dysfunction: Secondary | ICD-10-CM | POA: Diagnosis not present

## 2022-04-17 DIAGNOSIS — Z853 Personal history of malignant neoplasm of breast: Secondary | ICD-10-CM

## 2022-04-17 DIAGNOSIS — R000 Tachycardia, unspecified: Secondary | ICD-10-CM

## 2022-04-17 DIAGNOSIS — K449 Diaphragmatic hernia without obstruction or gangrene: Secondary | ICD-10-CM | POA: Diagnosis present

## 2022-04-17 DIAGNOSIS — N19 Unspecified kidney failure: Secondary | ICD-10-CM | POA: Diagnosis not present

## 2022-04-17 DIAGNOSIS — Z803 Family history of malignant neoplasm of breast: Secondary | ICD-10-CM

## 2022-04-17 DIAGNOSIS — J910 Malignant pleural effusion: Secondary | ICD-10-CM | POA: Diagnosis present

## 2022-04-17 DIAGNOSIS — C778 Secondary and unspecified malignant neoplasm of lymph nodes of multiple regions: Secondary | ICD-10-CM | POA: Diagnosis present

## 2022-04-17 DIAGNOSIS — F329 Major depressive disorder, single episode, unspecified: Secondary | ICD-10-CM | POA: Diagnosis not present

## 2022-04-17 DIAGNOSIS — C782 Secondary malignant neoplasm of pleura: Secondary | ICD-10-CM | POA: Diagnosis present

## 2022-04-17 DIAGNOSIS — Z8249 Family history of ischemic heart disease and other diseases of the circulatory system: Secondary | ICD-10-CM | POA: Diagnosis not present

## 2022-04-17 DIAGNOSIS — R531 Weakness: Secondary | ICD-10-CM | POA: Diagnosis not present

## 2022-04-17 DIAGNOSIS — D638 Anemia in other chronic diseases classified elsewhere: Secondary | ICD-10-CM

## 2022-04-17 DIAGNOSIS — Z1152 Encounter for screening for COVID-19: Secondary | ICD-10-CM | POA: Diagnosis not present

## 2022-04-17 DIAGNOSIS — C7951 Secondary malignant neoplasm of bone: Secondary | ICD-10-CM | POA: Diagnosis not present

## 2022-04-17 DIAGNOSIS — Z6837 Body mass index (BMI) 37.0-37.9, adult: Secondary | ICD-10-CM

## 2022-04-17 DIAGNOSIS — D649 Anemia, unspecified: Secondary | ICD-10-CM | POA: Insufficient documentation

## 2022-04-17 DIAGNOSIS — J301 Allergic rhinitis due to pollen: Secondary | ICD-10-CM

## 2022-04-17 DIAGNOSIS — Z888 Allergy status to other drugs, medicaments and biological substances status: Secondary | ICD-10-CM | POA: Diagnosis not present

## 2022-04-17 DIAGNOSIS — R0609 Other forms of dyspnea: Secondary | ICD-10-CM | POA: Diagnosis not present

## 2022-04-17 DIAGNOSIS — C787 Secondary malignant neoplasm of liver and intrahepatic bile duct: Secondary | ICD-10-CM | POA: Diagnosis present

## 2022-04-17 DIAGNOSIS — C801 Malignant (primary) neoplasm, unspecified: Secondary | ICD-10-CM | POA: Diagnosis not present

## 2022-04-17 DIAGNOSIS — J209 Acute bronchitis, unspecified: Secondary | ICD-10-CM | POA: Diagnosis not present

## 2022-04-17 DIAGNOSIS — C7801 Secondary malignant neoplasm of right lung: Secondary | ICD-10-CM | POA: Diagnosis present

## 2022-04-17 DIAGNOSIS — Z79899 Other long term (current) drug therapy: Secondary | ICD-10-CM | POA: Diagnosis not present

## 2022-04-17 DIAGNOSIS — F32A Depression, unspecified: Secondary | ICD-10-CM

## 2022-04-17 DIAGNOSIS — E66812 Obesity, class 2: Secondary | ICD-10-CM | POA: Diagnosis present

## 2022-04-17 DIAGNOSIS — E860 Dehydration: Secondary | ICD-10-CM | POA: Diagnosis not present

## 2022-04-17 DIAGNOSIS — I10 Essential (primary) hypertension: Secondary | ICD-10-CM | POA: Diagnosis present

## 2022-04-17 DIAGNOSIS — J40 Bronchitis, not specified as acute or chronic: Secondary | ICD-10-CM | POA: Diagnosis not present

## 2022-04-17 DIAGNOSIS — K8020 Calculus of gallbladder without cholecystitis without obstruction: Secondary | ICD-10-CM | POA: Diagnosis not present

## 2022-04-17 DIAGNOSIS — J309 Allergic rhinitis, unspecified: Secondary | ICD-10-CM | POA: Diagnosis not present

## 2022-04-17 DIAGNOSIS — E669 Obesity, unspecified: Secondary | ICD-10-CM | POA: Diagnosis present

## 2022-04-17 LAB — COMPREHENSIVE METABOLIC PANEL
ALT: 29 U/L (ref 0–44)
AST: 36 U/L (ref 15–41)
Albumin: 3 g/dL — ABNORMAL LOW (ref 3.5–5.0)
Alkaline Phosphatase: 122 U/L (ref 38–126)
Anion gap: 8 (ref 5–15)
BUN: 32 mg/dL — ABNORMAL HIGH (ref 8–23)
CO2: 27 mmol/L (ref 22–32)
Calcium: 8.3 mg/dL — ABNORMAL LOW (ref 8.9–10.3)
Chloride: 101 mmol/L (ref 98–111)
Creatinine, Ser: 0.8 mg/dL (ref 0.44–1.00)
GFR, Estimated: >60 mL/min (ref >60)
Glucose, Bld: 103 mg/dL — ABNORMAL HIGH (ref 70–99)
Potassium: 4.2 mmol/L (ref 3.5–5.1)
Sodium: 136 mmol/L (ref 135–145)
Total Bilirubin: 0.6 mg/dL (ref 0.3–1.2)
Total Protein: 6.8 g/dL (ref 6.5–8.1)

## 2022-04-17 LAB — TYPE AND SCREEN
ABO/RH(D): O POS
Antibody Screen: NEGATIVE

## 2022-04-17 LAB — CBC
HCT: 31 % — ABNORMAL LOW (ref 36.0–46.0)
Hemoglobin: 9.4 g/dL — ABNORMAL LOW (ref 12.0–15.0)
MCH: 30.8 pg (ref 26.0–34.0)
MCHC: 30.3 g/dL (ref 30.0–36.0)
MCV: 101.6 fL — ABNORMAL HIGH (ref 80.0–100.0)
Platelets: 472 10*3/uL — ABNORMAL HIGH (ref 150–400)
RBC: 3.05 MIL/uL — ABNORMAL LOW (ref 3.87–5.11)
RDW: 18.1 % — ABNORMAL HIGH (ref 11.5–15.5)
WBC: 14.7 10*3/uL — ABNORMAL HIGH (ref 4.0–10.5)
nRBC: 0.5 % — ABNORMAL HIGH (ref 0.0–0.2)

## 2022-04-17 LAB — TROPONIN I (HIGH SENSITIVITY): Troponin I (High Sensitivity): 3 ng/L (ref <18)

## 2022-04-17 LAB — BRAIN NATRIURETIC PEPTIDE: B Natriuretic Peptide: 21.2 pg/mL (ref 0.0–100.0)

## 2022-04-17 MED ORDER — ACETAMINOPHEN 650 MG RE SUPP: 650.0000 mg | Freq: Four times a day (QID) | RECTAL | Status: DC | PRN

## 2022-04-17 MED ORDER — LEVALBUTEROL HCL 1.25 MG/0.5ML IN NEBU: 1.2500 mg | INHALATION_SOLUTION | RESPIRATORY_TRACT | Status: DC | PRN

## 2022-04-17 MED ORDER — SODIUM CHLORIDE 0.9 % IV BOLUS
500.0000 mL | Freq: Once | INTRAVENOUS | Status: AC
  Administered 2022-04-17: 500 mL via INTRAVENOUS

## 2022-04-17 MED ORDER — IOHEXOL 350 MG/ML SOLN
75.0000 mL | Freq: Once | INTRAVENOUS | Status: AC | PRN
  Administered 2022-04-17: 75 mL via INTRAVENOUS

## 2022-04-17 MED ORDER — LEVALBUTEROL HCL 1.25 MG/0.5ML IN NEBU
1.2500 mg | INHALATION_SOLUTION | Freq: Four times a day (QID) | RESPIRATORY_TRACT | Status: DC
  Administered 2022-04-18: 1.25 mg via RESPIRATORY_TRACT
  Filled 2022-04-17: qty 0.5

## 2022-04-17 MED ORDER — ACETAMINOPHEN 325 MG PO TABS: 650.0000 mg | ORAL_TABLET | Freq: Four times a day (QID) | ORAL | Status: DC | PRN

## 2022-04-17 MED ORDER — IPRATROPIUM BROMIDE 0.02 % IN SOLN
0.5000 mg | Freq: Four times a day (QID) | RESPIRATORY_TRACT | Status: DC
  Administered 2022-04-18: 0.5 mg via RESPIRATORY_TRACT
  Filled 2022-04-17: qty 2.5

## 2022-04-17 MED ORDER — MELATONIN 3 MG PO TABS: 3.0000 mg | ORAL_TABLET | Freq: Every evening | ORAL | Status: DC | PRN

## 2022-04-17 NOTE — ED Notes (Signed)
Provided pt with a [place] sandwich and ice water after RN gave the ok

## 2022-04-17 NOTE — Telephone Encounter (Signed)
[name] called this RN earlier today stating concern that her mother is "notably incredibly weak"  She states pt slept all day Saturday- then yesterday pt was up in the kitchen and became very dizzy and almost passed out.  Today she is still tremendously weak. [name] is asking if possible to come in today due to she has assistance with getting her mother up and transferring her.  Appts rescheduled to today for 2pm.  At 1329 this RN was contacted again with pt stating she does not feel she can physically come in by car/van and is asking "if I call 911 would they bring me to the office"  This RN informed her usually 911 ambulances will not immediately come to get pt's for MD appts but they may put her on a list for non urgent pick up. This RN also stated concern due to pt's symptoms and possible further needs that cannot be provided if she was to come in to this office late in the day due to the ambulance not able to provide needed transportation in timely manner.  Per discussion pt may need to proceed to the ER if her issues are more concerning and need for evaluation.  Pt verbalized understanding.

## 2022-04-17 NOTE — H&P (Signed)
History and Physical      Suzanne [name]:[id number] DOB: 1961/01/06 DOA: 04/17/2022  PCP: [name], NP *** Patient coming from: home ***  I have personally briefly reviewed patient's old medical records in [place]  Chief Complaint: ***  HPI: Suzanne [name] is a 61 y.o. female with medical history significant for *** who is admitted to [facility] on 04/17/2022 with *** after presenting from home*** to [facility] ED complaining of ***.   ***        ***  ED Course:  Vital signs in the ED were notable for the following: ***  Labs were notable for the following: ***  Per my interpretation, EKG in ED demonstrated the following:  ***  Imaging and additional notable ED work-up: ***  While in the ED, the following were administered: ***  Subsequently, the patient was admitted  ***  ***red   Review of Systems: As per HPI otherwise 10 point review of systems negative.   Past Medical History:  Diagnosis Date   Cancer [facility])    Family history of breast cancer    Family history of colon cancer    Family history of melanoma    Family history of prostate cancer    Gallstones    Glaucoma    Mastitis    reports history of recurrent mastitis   Metastatic breast cancer    Obesity     Past Surgical History:  Procedure Laterality Date   GLAUCOMA SURGERY  late 1990's   IR THORACENTESIS ASP PLEURAL SPACE W/IMG GUIDE  10/28/2018   IR THORACENTESIS ASP PLEURAL SPACE W/IMG GUIDE  10/29/2018   OPEN REDUCTION INTERNAL FIXATION (ORIF) DISTAL RADIAL FRACTURE Left 01/09/2019   Procedure: OPEN REDUCTION INTERNAL FIXATION (ORIF) HUMERAL FRACTURE;  Surgeon: Netta Cedars, MD;  Location: [facility] ORS;  Service: Orthopedics;  Laterality: Left;    Social History:  reports that she has never smoked. She has never used smokeless tobacco. She reports that she does not drink alcohol and does not use drugs.   Allergies  Allergen Reactions   Ferumoxytol Other (See Comments)     Pt had hypersensitivity to Ferumoxytol. She had Facial Flushing. See note from 07/14/2021    Family History  Problem Relation Age of Onset   Breast cancer Sister        in her 39's-70s   Heart disease Brother        CABG   Heart disease Brother        CABG   Skin cancer Brother        melanoma   Colon cancer Cousin    Heart attack Mother    Heart attack Father    Prostate cancer Brother     Family history reviewed and not pertinent ***   Prior to Admission medications   Medication Sig Start Date End Date Taking? Authorizing Provider  acetaminophen (TYLENOL) 500 MG tablet Take 1,000 mg by mouth every 6 (six) hours as needed for mild pain.    [provider]  Blood Pressure Monitor MISC Medium sized cuff. Use to check blood pressure twice per day and more often if not feeling well. I10.0 07/19/20   [name], NP  cetirizine (ZYRTEC) 10 MG tablet Take 10 mg by mouth daily.    [provider]  famotidine (PEPCID) 20 MG tablet Take 1 tablet (20 mg total) by mouth 2 (two) times daily as needed for heartburn or indigestion. 07/28/20   [name],  Virgie Dad, MD  fluconazole (DIFLUCAN) 200 MG tablet Take 1 tablet (200 mg total) by mouth daily. 04/06/22   Gardenia Phlegm, NP  folic acid (FOLVITE) 1 MG tablet Take 1 tablet (1 mg total) by mouth daily. 09/29/21   Benay Pike, MD  furosemide (LASIX) 20 MG tablet Take 1 tablet (20 mg total) by mouth daily. 09/29/21   Benay Pike, MD  Ibuprofen-Acetaminophen (ADVIL DUAL ACTION) 125-250 MG TABS Take 2 tablets by mouth daily as needed (pain).    [provider]  ketoconazole (NIZORAL) 2 % cream Apply topically daily. Patient taking differently: Apply 1 Application topically daily as needed for irritation. 12/11/21   Georgette Shell, MD  MELATONIN TR 1 MG TBCR TAKE ONE TABLET BY MOUTH EVERYDAY AT BEDTIME 04/03/22   Benay Pike, MD  methylPREDNISolone (MEDROL DOSEPAK) 4 MG TBPK tablet Taper  6,5,4,3,2,1 04/06/22   Gardenia Phlegm, NP  nystatin (MYCOSTATIN/NYSTOP) powder Apply 1 application topically as needed. Patient taking differently: Apply 1 application  topically daily as needed (skin irritation). 07/22/20   Gardenia Phlegm, NP  ondansetron (ZOFRAN) 8 MG tablet Take 1 tablet (8 mg total) by mouth every 8 (eight) hours as needed for nausea or vomiting. 02/26/22   Benay Pike, MD  OYSTER SHELL CALCIUM PLUS D 500-5 MG-MCG TABS TAKE ONE TABLET BY MOUTH EVERY MORNING and TAKE ONE TABLET BY MOUTH AT NOON and TAKE ONE TABLET BY MOUTH EVERY EVENING Patient taking differently: Take 1 tablet by mouth 2 (two) times daily. 04/27/21   [name], Virgie Dad, MD  polyethylene glycol (MIRALAX / GLYCOLAX) 17 g packet Take 17 g by mouth as needed. Patient taking differently: Take 17 g by mouth daily as needed for mild constipation. 07/28/20   [name], Virgie Dad, MD  sertraline (ZOLOFT) 50 MG tablet TAKE 1 AND 1/2 TABLETS BY MOUTH EVERYDAY AT BEDTIME 12/25/21   Charlott Rakes, MD  triamcinolone ointment (KENALOG) 0.5 % Apply 1 Application topically 2 (two) times daily. 04/02/22   Benay Pike, MD     Objective    Physical Exam: Vitals:   04/17/22 2150 04/17/22 2200 04/17/22 2201 04/17/22 2245  BP: 120/86 117/85  123/77  Pulse: (!) 113 (!) 114  (!) 112  Resp: '18 18  18  '$ Temp:   97.8 F (36.6 C)   TempSrc:      SpO2: 98% 99%  98%  Weight:      Height:        General: appears to be stated age; alert, oriented Skin: warm, dry, no rash Head:  AT/[place] Mouth:  Oral mucosa membranes appear moist, normal dentition Neck: supple; trachea midline Heart:  RRR; did not appreciate any M/R/G Lungs: CTAB, did not appreciate any wheezes, rales, or rhonchi Abdomen: + BS; soft, ND, NT Vascular: 2+ pedal pulses b/l; 2+ radial pulses b/l Extremities: no peripheral edema, no muscle wasting Neuro: strength and sensation intact in upper and lower extremities b/l    *** Neuro: 5/5  strength of the proximal and distal flexors and extensors of the upper and lower extremities bilaterally; sensation intact in upper and lower extremities b/l; cranial nerves II through XII grossly intact; no pronator drift; no evidence suggestive of slurred speech, dysarthria, or facial droop; Normal muscle tone. No tremors. *** Neuro: In the setting of the patient's current mental status and associated inability to follow instructions, unable to perform full neurologic exam at this time.  As such, assessment of strength, sensation, and cranial nerves is limited at  this time. Patient noted to spontaneously move all 4 extremities. No tremors.  ***    Labs on Admission: I have personally reviewed following labs and imaging studies  CBC: Recent Labs  Lab 04/17/22 1454  WBC 14.7*  HGB 9.4*  HCT 31.0*  MCV 101.6*  PLT 709*   Basic Metabolic Panel: Recent Labs  Lab 04/17/22 1454  NA 136  K 4.2  CL 101  CO2 27  GLUCOSE 103*  BUN 32*  CREATININE 0.80  CALCIUM 8.3*   GFR: Estimated Creatinine Clearance: 64.5 mL/min (by C-G formula based on SCr of 0.8 mg/dL). Liver Function Tests: Recent Labs  Lab 04/17/22 1454  AST 36  ALT 29  ALKPHOS 122  BILITOT 0.6  PROT 6.8  ALBUMIN 3.0*   No results for input(s): "LIPASE", "AMYLASE" in the last 168 hours. No results for input(s): "AMMONIA" in the last 168 hours. Coagulation Profile: No results for input(s): "INR", "PROTIME" in the last 168 hours. Cardiac Enzymes: No results for input(s): "CKTOTAL", "CKMB", "CKMBINDEX", "TROPONINI" in the last 168 hours. BNP (last 3 results) No results for input(s): "PROBNP" in the last 8760 hours. HbA1C: No results for input(s): "HGBA1C" in the last 72 hours. CBG: No results for input(s): "GLUCAP" in the last 168 hours. Lipid Profile: No results for input(s): "CHOL", "HDL", "LDLCALC", "TRIG", "CHOLHDL", "LDLDIRECT" in the last 72 hours. Thyroid Function Tests: No results for input(s): "TSH",  "T4TOTAL", "FREET4", "T3FREE", "THYROIDAB" in the last 72 hours. Anemia Panel: No results for input(s): "VITAMINB12", "FOLATE", "FERRITIN", "TIBC", "IRON", "RETICCTPCT" in the last 72 hours. Urine analysis:    Component Value Date/Time   COLORURINE YELLOW 12/06/2021 1620   APPEARANCEUR HAZY (A) 12/06/2021 1620   LABSPEC 1.029 12/06/2021 1620   PHURINE 5.0 12/06/2021 1620   GLUCOSEU NEGATIVE 12/06/2021 1620   HGBUR NEGATIVE 12/06/2021 1620   BILIRUBINUR NEGATIVE 12/06/2021 1620   KETONESUR 20 (A) 12/06/2021 1620   PROTEINUR 100 (A) 12/06/2021 1620   NITRITE NEGATIVE 12/06/2021 1620   LEUKOCYTESUR LARGE (A) 12/06/2021 1620    Radiological Exams on Admission: CT Angio Chest PE W and/or Wo Contrast  Result Date: 04/17/2022 CLINICAL DATA:  Weakness and dyspnea on exertion. History of metastatic breast cancer. EXAM: CT ANGIOGRAPHY CHEST WITH CONTRAST TECHNIQUE: Multidetector CT imaging of the chest was performed using the standard protocol during bolus administration of intravenous contrast. Multiplanar CT image reconstructions and MIPs were obtained to evaluate the vascular anatomy. RADIATION DOSE REDUCTION: This exam was performed according to the departmental dose-optimization program which includes automated exposure control, adjustment of the mA and/or kV according to patient size and/or use of iterative reconstruction technique. CONTRAST:  15m OMNIPAQUE IOHEXOL 350 MG/ML SOLN COMPARISON:  CT chest dated March 05, 2022. FINDINGS: Cardiovascular: Satisfactory opacification of the pulmonary arteries to the segmental level. No evidence of pulmonary embolism. Normal heart size. No pericardial effusion. No thoracic aortic aneurysm. Coronary, aortic arch, and branch vessel atherosclerotic vascular disease. Mediastinum/Nodes: No enlarged mediastinal, hilar, or axillary lymph nodes. Thyroid gland, trachea, and esophagus demonstrate no significant findings. Lungs/Pleura: Relatively unchanged mosaic  attenuation, more conspicuous on today's study compared to prior. Scattered subsegmental atelectasis/scarring in the right lung again noted. Trace bilateral pleural fluid again noted. No consolidation or pneumothorax. Upper Abdomen: No acute abnormality. Multiple ill-defined hypodense liver lesions again noted. Multiple gallstones again noted. Unchanged moderate to large hiatal hernia. Musculoskeletal: Chronic severe elevation of the right hemidiaphragm. Diffuse osseous sclerosis again noted. Multiple old bilateral rib fractures. Chronic T11 and T12 compression  deformities with focal kyphosis, unchanged. Review of the MIP images confirms the above findings. IMPRESSION: 1. No evidence of pulmonary embolism. 2. Relatively unchanged mosaic attenuation, more conspicuous on today's study compared to prior. This is nonspecific but can be seen with small airways disease. 3. Unchanged hepatic and osseous metastatic disease 4.  Aortic atherosclerosis (ICD10-I70.0). Electronically Signed   By: Titus Dubin M.D.   On: 04/17/2022 21:15   DG Chest Port 1 View  Result Date: 04/17/2022 CLINICAL DATA:  Tachycardia and shortness of breath EXAM: PORTABLE CHEST 1 VIEW COMPARISON:  Chest x-ray 12/06/2021.  Chest CT 03/05/2022. FINDINGS: There is stable moderate elevation of the right hemidiaphragm. Moderate-sized hiatal hernia is again seen. There is no focal lung infiltrate, pleural effusion or pneumothorax. Cardiomediastinal silhouette is within normal limits. Diffuse osseous sclerotic metastatic disease again noted. No acute fractures are seen. There are multiple orthopedic screws in the proximal left humerus. IMPRESSION: 1. No active disease. 2. Stable moderate elevation of the right hemidiaphragm. 3. Moderate-sized hiatal hernia. Electronically Signed   By: Ronney Asters M.D.   On: 04/17/2022 17:04      Assessment/Plan    Principal Problem:   Acute  bronchitis  ***      ***          ***           ***          ***          ***          ***          ***          ***          ***          ***     ***  DVT prophylaxis: SCD's ***  Code Status: Full code*** Family Communication: none*** Disposition Plan: Per Rounding Team Consults called: none***;  Admission status: ***    I SPENT GREATER THAN 75 *** MINUTES IN CLINICAL CARE TIME/MEDICAL DECISION-MAKING IN COMPLETING THIS ADMISSION.     [place] DO [facility] [place]   04/17/2022, 11:43 PM   ***

## 2022-04-17 NOTE — ED Provider Notes (Signed)
Suzanne [name] DEPT Provider Note   CSN: [id number] Arrival date & time: 04/17/22  1428     History {Add pertinent medical, surgical, social history, OB history to HPI:1} Chief Complaint  Patient presents with   Weakness   Shortness of Breath    Suzanne [name] is a 61 y.o. female.   Weakness Associated symptoms: shortness of breath   Shortness of Breath    Patient has a history of gallstones metastatic breast cancer, anemia, pleural effusions who presents to the ED with complaints of shortness of breath and fatigue.  Patient states she felt fine yesterday.  When she woke up this morning she was feeling short of breath.  She noted she was getting winded with just walking across the room.  She has not had any chest pain.  No fevers.  She has not been coughing.  She denies any blood in her stool.  Patient states she had been on oral chemotherapy medications.   Home Medications Prior to Admission medications   Medication Sig Start Date End Date Taking? Authorizing Provider  acetaminophen (TYLENOL) 500 MG tablet Take 1,000 mg by mouth every 6 (six) hours as needed for mild pain.    [provider]  Blood Pressure Monitor MISC Medium sized cuff. Use to check blood pressure twice per day and more often if not feeling well. I10.0 07/19/20   [name], NP  cetirizine (ZYRTEC) 10 MG tablet Take 10 mg by mouth daily.    [provider]  famotidine (PEPCID) 20 MG tablet Take 1 tablet (20 mg total) by mouth 2 (two) times daily as needed for heartburn or indigestion. 07/28/20   [name], Virgie Dad, MD  fluconazole (DIFLUCAN) 200 MG tablet Take 1 tablet (200 mg total) by mouth daily. 04/06/22   Gardenia Phlegm, NP  folic acid (FOLVITE) 1 MG tablet Take 1 tablet (1 mg total) by mouth daily. 09/29/21   Benay Pike, MD  furosemide (LASIX) 20 MG tablet Take 1 tablet (20 mg total) by mouth daily. 09/29/21   Benay Pike, MD   Ibuprofen-Acetaminophen (ADVIL DUAL ACTION) 125-250 MG TABS Take 2 tablets by mouth daily as needed (pain).    [provider]  ketoconazole (NIZORAL) 2 % cream Apply topically daily. Patient taking differently: Apply 1 Application topically daily as needed for irritation. 12/11/21   Georgette Shell, MD  MELATONIN TR 1 MG TBCR TAKE ONE TABLET BY MOUTH EVERYDAY AT BEDTIME 04/03/22   Benay Pike, MD  methylPREDNISolone (MEDROL DOSEPAK) 4 MG TBPK tablet Taper 6,5,4,3,2,1 04/06/22   Gardenia Phlegm, NP  nystatin (MYCOSTATIN/NYSTOP) powder Apply 1 application topically as needed. Patient taking differently: Apply 1 application  topically daily as needed (skin irritation). 07/22/20   Gardenia Phlegm, NP  ondansetron (ZOFRAN) 8 MG tablet Take 1 tablet (8 mg total) by mouth every 8 (eight) hours as needed for nausea or vomiting. 02/26/22   Benay Pike, MD  OYSTER SHELL CALCIUM PLUS D 500-5 MG-MCG TABS TAKE ONE TABLET BY MOUTH EVERY MORNING and TAKE ONE TABLET BY MOUTH AT NOON and TAKE ONE TABLET BY MOUTH EVERY EVENING Patient taking differently: Take 1 tablet by mouth 2 (two) times daily. 04/27/21   [name], Virgie Dad, MD  polyethylene glycol (MIRALAX / GLYCOLAX) 17 g packet Take 17 g by mouth as needed. Patient taking differently: Take 17 g by mouth daily as needed for mild constipation. 07/28/20   [name], Virgie Dad, MD  sertraline (ZOLOFT) 50 MG tablet  TAKE 1 AND 1/2 TABLETS BY MOUTH EVERYDAY AT BEDTIME 12/25/21   Charlott Rakes, MD  triamcinolone ointment (KENALOG) 0.5 % Apply 1 Application topically 2 (two) times daily. 04/02/22   Benay Pike, MD      Allergies    Ferumoxytol    Review of Systems   Review of Systems  Respiratory:  Positive for shortness of breath.   Neurological:  Positive for weakness.    Physical Exam Updated Vital Signs BP 117/85   Pulse (!) 114   Temp 97.8 F (36.6 C)   Resp 18   Ht 1.473 m ('4\' 10"'$ )   Wt 80.7 kg   LMP  05/28/2013 (Within Months)   SpO2 99%   BMI 37.20 kg/m  Physical Exam Vitals and nursing note reviewed.  Constitutional:      General: She is not in acute distress.    Appearance: She is well-developed.  HENT:     Head: Normocephalic and atraumatic.     Right Ear: External ear normal.     Left Ear: External ear normal.  Eyes:     General: No scleral icterus.       Right eye: No discharge.        Left eye: No discharge.     Conjunctiva/sclera: Conjunctivae normal.  Neck:     Trachea: No tracheal deviation.  Cardiovascular:     Rate and Rhythm: Regular rhythm. Tachycardia present.  Pulmonary:     Effort: Pulmonary effort is normal. No respiratory distress.     Breath sounds: Normal breath sounds. No stridor. No wheezing or rales.  Abdominal:     General: Bowel sounds are normal. There is no distension.     Palpations: Abdomen is soft.     Tenderness: There is no abdominal tenderness. There is no guarding or rebound.  Musculoskeletal:        General: No tenderness or deformity.     Cervical back: Neck supple.  Skin:    General: Skin is warm and dry.     Findings: Rash present.     Comments: Erythematous rash on her lower back (patient states that is not new when she has had that for a while)  Neurological:     General: No focal deficit present.     Mental Status: She is alert.     Cranial Nerves: No cranial nerve deficit (no facial droop, extraocular movements intact, no slurred speech).     Sensory: No sensory deficit.     Motor: No abnormal muscle tone or seizure activity.     Coordination: Coordination normal.  Psychiatric:        Mood and Affect: Mood normal.     ED Results / Procedures / Treatments   Labs (all labs ordered are listed, but only abnormal results are displayed) Labs Reviewed  COMPREHENSIVE METABOLIC PANEL - Abnormal; Notable for the following components:      Result Value   Glucose, Bld 103 (*)    BUN 32 (*)    Calcium 8.3 (*)    Albumin 3.0  (*)    All other components within normal limits  CBC - Abnormal; Notable for the following components:   WBC 14.7 (*)    RBC 3.05 (*)    Hemoglobin 9.4 (*)    HCT 31.0 (*)    MCV 101.6 (*)    RDW 18.1 (*)    Platelets 472 (*)    nRBC 0.5 (*)    All other components within normal limits  RESP PANEL BY RT-PCR (FLU A&B, COVID) ARPGX2  SARS CORONAVIRUS 2 BY RT PCR  BRAIN NATRIURETIC PEPTIDE  TYPE AND SCREEN  TROPONIN I (HIGH SENSITIVITY)  TROPONIN I (HIGH SENSITIVITY)    EKG EKG Interpretation  Date/Time:  Tuesday April 17 2022 15:55:23 EST Ventricular Rate:  116 PR Interval:  159 QRS Duration: 82 QT Interval:  311 QTC Calculation: 432 R Axis:   1 Text Interpretation: Sinus tachycardia LVH by voltage Inferior infarct, old No significant change since last tracing Confirmed by Dorie Rank [id number]) on 04/17/2022 3:56:57 PM  Radiology CT Angio Chest PE W and/or Wo Contrast  Result Date: 04/17/2022 CLINICAL DATA:  Weakness and dyspnea on exertion. History of metastatic breast cancer. EXAM: CT ANGIOGRAPHY CHEST WITH CONTRAST TECHNIQUE: Multidetector CT imaging of the chest was performed using the standard protocol during bolus administration of intravenous contrast. Multiplanar CT image reconstructions and MIPs were obtained to evaluate the vascular anatomy. RADIATION DOSE REDUCTION: This exam was performed according to the departmental dose-optimization program which includes automated exposure control, adjustment of the mA and/or kV according to patient size and/or use of iterative reconstruction technique. CONTRAST:  66m OMNIPAQUE IOHEXOL 350 MG/ML SOLN COMPARISON:  CT chest dated March 05, 2022. FINDINGS: Cardiovascular: Satisfactory opacification of the pulmonary arteries to the segmental level. No evidence of pulmonary embolism. Normal heart size. No pericardial effusion. No thoracic aortic aneurysm. Coronary, aortic arch, and branch vessel atherosclerotic vascular disease.  Mediastinum/Nodes: No enlarged mediastinal, hilar, or axillary lymph nodes. Thyroid gland, trachea, and esophagus demonstrate no significant findings. Lungs/Pleura: Relatively unchanged mosaic attenuation, more conspicuous on today's study compared to prior. Scattered subsegmental atelectasis/scarring in the right lung again noted. Trace bilateral pleural fluid again noted. No consolidation or pneumothorax. Upper Abdomen: No acute abnormality. Multiple ill-defined hypodense liver lesions again noted. Multiple gallstones again noted. Unchanged moderate to large hiatal hernia. Musculoskeletal: Chronic severe elevation of the right hemidiaphragm. Diffuse osseous sclerosis again noted. Multiple old bilateral rib fractures. Chronic T11 and T12 compression deformities with focal kyphosis, unchanged. Review of the MIP images confirms the above findings. IMPRESSION: 1. No evidence of pulmonary embolism. 2. Relatively unchanged mosaic attenuation, more conspicuous on today's study compared to prior. This is nonspecific but can be seen with small airways disease. 3. Unchanged hepatic and osseous metastatic disease 4.  Aortic atherosclerosis (ICD10-I70.0). Electronically Signed   By: WTitus DubinM.D.   On: 04/17/2022 21:15   DG Chest Port 1 View  Result Date: 04/17/2022 CLINICAL DATA:  Tachycardia and shortness of breath EXAM: PORTABLE CHEST 1 VIEW COMPARISON:  Chest x-ray 12/06/2021.  Chest CT 03/05/2022. FINDINGS: There is stable moderate elevation of the right hemidiaphragm. Moderate-sized hiatal hernia is again seen. There is no focal lung infiltrate, pleural effusion or pneumothorax. Cardiomediastinal silhouette is within normal limits. Diffuse osseous sclerotic metastatic disease again noted. No acute fractures are seen. There are multiple orthopedic screws in the proximal left humerus. IMPRESSION: 1. No active disease. 2. Stable moderate elevation of the right hemidiaphragm. 3. Moderate-sized hiatal hernia.  Electronically Signed   By: ARonney AstersM.D.   On: 04/17/2022 17:04    Procedures Procedures  {Document cardiac monitor, telemetry assessment procedure when appropriate:1}  Medications Ordered in ED Medications  sodium chloride 0.9 % bolus 500 mL (0 mLs Intravenous Stopped 04/17/22 1737)  iohexol (OMNIPAQUE) 350 MG/ML injection 75 mL (75 mLs Intravenous Contrast Given 04/17/22 2053)    ED Course/ Medical Decision Making/ A&P Clinical Course as of 04/17/22 2221  Tue Apr 17, 2022  1929 CBC(!) Leukocytosis noted, anemia stable [JK]  1929 Comprehensive metabolic panel(!) BUN increased compared to previous [JK]  1929 Troponin I (High Sensitivity) Troponin normal [JK]  1929 DG Chest Port 1 View Chest x-ray without acute findings [JK]  2138 CT scan without PE, mosaic pattern noted in the lungs, unchanged metastatic disease [JK]    Clinical Course User Index [JK] Dorie Rank, MD                           Medical Decision Making Differential diagnosis includes but not limited to pulmonary embolism, pneumonia, pleural effusion, myocarditis, cardiomyopathy, ACS  Problems Addressed: Dyspnea on exertion: acute illness or injury that poses a threat to life or bodily functions Tachycardia: acute illness or injury that poses a threat to life or bodily functions  Amount and/or Complexity of Data Reviewed Labs: ordered. Decision-making details documented in ED Course. Radiology: ordered and independent interpretation performed. Decision-making details documented in ED Course.  Risk Prescription drug management.   Patient presented ED for evaluation of shortness of breath.  Patient noted to have persistent tachycardia in the ED.  White blood cell count elevated but no signs of pneumonia on chest x-ray.  With her persistent tachycardia concerned about the possibility of pulmonary embolism.  CT angiogram performed.  No signs of PE.  No other acute abnormality noted.  Patient however remains  persistently tachycardic.  Considering her comorbidities and the tachycardia I think she would benefit from overnight observation, possible echocardiogram in the morning.  We will consult the medical service for admission  {Document critical care time when appropriate:1} {Document review of labs and clinical decision tools ie heart score, Chads2Vasc2 etc:1}  {Document your independent review of radiology images, and any outside records:1} {Document your discussion with family members, caretakers, and with consultants:1} {Document social determinants of health affecting pt's care:1} {Document your decision making why or why not admission, treatments were needed:1} Final Clinical Impression(s) / ED Diagnoses Final diagnoses:  Dyspnea on exertion  Tachycardia    Rx / DC Orders ED Discharge Orders     None

## 2022-04-17 NOTE — ED Triage Notes (Signed)
Pt BIB EMS from home c/o generalized weakness and SOB on exertion. Hx of anemia, hx of breast cancer, stage 4. 20 LAC.  BP 90 Palp 98% on 2L 31m Fluid BP 138/86 HR 120 CBG 188 T 97.2 RR 18

## 2022-04-18 ENCOUNTER — Telehealth: Payer: Self-pay | Admitting: *Deleted

## 2022-04-18 ENCOUNTER — Inpatient Hospital Stay: Payer: Medicaid Other

## 2022-04-18 ENCOUNTER — Encounter (HOSPITAL_COMMUNITY): Payer: Self-pay | Admitting: Internal Medicine

## 2022-04-18 ENCOUNTER — Inpatient Hospital Stay (HOSPITAL_COMMUNITY): Payer: Medicaid Other

## 2022-04-18 ENCOUNTER — Inpatient Hospital Stay: Payer: Medicaid Other | Admitting: Hematology and Oncology

## 2022-04-18 DIAGNOSIS — C7951 Secondary malignant neoplasm of bone: Secondary | ICD-10-CM | POA: Diagnosis not present

## 2022-04-18 DIAGNOSIS — R6510 Systemic inflammatory response syndrome (SIRS) of non-infectious origin without acute organ dysfunction: Secondary | ICD-10-CM | POA: Insufficient documentation

## 2022-04-18 DIAGNOSIS — N19 Unspecified kidney failure: Secondary | ICD-10-CM | POA: Insufficient documentation

## 2022-04-18 DIAGNOSIS — F32A Depression, unspecified: Secondary | ICD-10-CM | POA: Diagnosis not present

## 2022-04-18 DIAGNOSIS — R0609 Other forms of dyspnea: Secondary | ICD-10-CM

## 2022-04-18 DIAGNOSIS — E860 Dehydration: Secondary | ICD-10-CM | POA: Insufficient documentation

## 2022-04-18 DIAGNOSIS — J309 Allergic rhinitis, unspecified: Secondary | ICD-10-CM | POA: Diagnosis present

## 2022-04-18 DIAGNOSIS — D638 Anemia in other chronic diseases classified elsewhere: Secondary | ICD-10-CM | POA: Diagnosis not present

## 2022-04-18 DIAGNOSIS — J40 Bronchitis, not specified as acute or chronic: Secondary | ICD-10-CM | POA: Diagnosis not present

## 2022-04-18 DIAGNOSIS — J301 Allergic rhinitis due to pollen: Secondary | ICD-10-CM | POA: Diagnosis not present

## 2022-04-18 DIAGNOSIS — E669 Obesity, unspecified: Secondary | ICD-10-CM | POA: Diagnosis not present

## 2022-04-18 LAB — PHOSPHORUS: Phosphorus: 4 mg/dL (ref 2.5–4.6)

## 2022-04-18 LAB — CBC WITH DIFFERENTIAL/PLATELET
Abs Immature Granulocytes: 0.13 10*3/uL — ABNORMAL HIGH (ref 0.00–0.07)
Basophils Absolute: 0.1 10*3/uL (ref 0.0–0.1)
Basophils Relative: 0 %
Eosinophils Absolute: 0.3 10*3/uL (ref 0.0–0.5)
Eosinophils Relative: 2 %
HCT: 27.6 % — ABNORMAL LOW (ref 36.0–46.0)
Hemoglobin: 8.3 g/dL — ABNORMAL LOW (ref 12.0–15.0)
Immature Granulocytes: 1 %
Lymphocytes Relative: 15 %
Lymphs Abs: 1.9 10*3/uL (ref 0.7–4.0)
MCH: 30.4 pg (ref 26.0–34.0)
MCHC: 30.1 g/dL (ref 30.0–36.0)
MCV: 101.1 fL — ABNORMAL HIGH (ref 80.0–100.0)
Monocytes Absolute: 0.8 10*3/uL (ref 0.1–1.0)
Monocytes Relative: 6 %
Neutro Abs: 9.9 10*3/uL — ABNORMAL HIGH (ref 1.7–7.7)
Neutrophils Relative %: 76 %
Platelets: 399 10*3/uL (ref 150–400)
RBC: 2.73 MIL/uL — ABNORMAL LOW (ref 3.87–5.11)
RDW: 18.4 % — ABNORMAL HIGH (ref 11.5–15.5)
WBC: 13.1 10*3/uL — ABNORMAL HIGH (ref 4.0–10.5)
nRBC: 0.2 % (ref 0.0–0.2)

## 2022-04-18 LAB — MAGNESIUM: Magnesium: 2.3 mg/dL (ref 1.7–2.4)

## 2022-04-18 LAB — ECHOCARDIOGRAM LIMITED
AR max vel: 2.16 cm2
AV Area VTI: 2.16 cm2
AV Area mean vel: 2.24 cm2
AV Mean grad: 4 mmHg
AV Peak grad: 8.4 mmHg
Ao pk vel: 1.45 m/s
Area-P 1/2: 3.72 cm2
Calc EF: 66.2 %
Height: 58 in
S' Lateral: 2.6 cm
Single Plane A2C EF: 68.4 %
Single Plane A4C EF: 62.2 %
Weight: 2848 oz

## 2022-04-18 LAB — COMPREHENSIVE METABOLIC PANEL
ALT: 26 U/L (ref 0–44)
AST: 37 U/L (ref 15–41)
Albumin: 2.9 g/dL — ABNORMAL LOW (ref 3.5–5.0)
Alkaline Phosphatase: 107 U/L (ref 38–126)
Anion gap: 6 (ref 5–15)
BUN: 27 mg/dL — ABNORMAL HIGH (ref 8–23)
CO2: 27 mmol/L (ref 22–32)
Calcium: 8.2 mg/dL — ABNORMAL LOW (ref 8.9–10.3)
Chloride: 104 mmol/L (ref 98–111)
Creatinine, Ser: 0.84 mg/dL (ref 0.44–1.00)
GFR, Estimated: >60 mL/min (ref >60)
Glucose, Bld: 121 mg/dL — ABNORMAL HIGH (ref 70–99)
Potassium: 4.7 mmol/L (ref 3.5–5.1)
Sodium: 137 mmol/L (ref 135–145)
Total Bilirubin: 0.6 mg/dL (ref 0.3–1.2)
Total Protein: 6.3 g/dL — ABNORMAL LOW (ref 6.5–8.1)

## 2022-04-18 LAB — SARS CORONAVIRUS 2 BY RT PCR: SARS Coronavirus 2 by RT PCR: NEGATIVE

## 2022-04-18 MED ORDER — IPRATROPIUM BROMIDE 0.02 % IN SOLN
0.5000 mg | Freq: Two times a day (BID) | RESPIRATORY_TRACT | Status: DC
  Administered 2022-04-18: 0.5 mg via RESPIRATORY_TRACT
  Filled 2022-04-18: qty 2.5

## 2022-04-18 MED ORDER — LORATADINE 10 MG PO TABS
10.0000 mg | ORAL_TABLET | Freq: Every day | ORAL | Status: DC
  Administered 2022-04-18 – 2022-04-19 (×2): 10 mg via ORAL
  Filled 2022-04-18 (×2): qty 1

## 2022-04-18 MED ORDER — LEVALBUTEROL HCL 1.25 MG/0.5ML IN NEBU
1.2500 mg | INHALATION_SOLUTION | Freq: Two times a day (BID) | RESPIRATORY_TRACT | Status: DC
  Administered 2022-04-18: 1.25 mg via RESPIRATORY_TRACT
  Filled 2022-04-18: qty 0.5

## 2022-04-18 MED ORDER — IPRATROPIUM-ALBUTEROL 0.5-2.5 (3) MG/3ML IN SOLN: 3.0000 mL | Freq: Four times a day (QID) | RESPIRATORY_TRACT | Status: DC

## 2022-04-18 MED ORDER — SERTRALINE HCL 50 MG PO TABS
75.0000 mg | ORAL_TABLET | Freq: Every day | ORAL | Status: DC
  Administered 2022-04-18: 75 mg via ORAL
  Filled 2022-04-18: qty 1

## 2022-04-18 MED ORDER — SERTRALINE HCL 50 MG PO TABS: 50.0000 mg | ORAL_TABLET | Freq: Every day | ORAL | Status: DC

## 2022-04-18 MED ORDER — IPRATROPIUM-ALBUTEROL 0.5-2.5 (3) MG/3ML IN SOLN
3.0000 mL | Freq: Four times a day (QID) | RESPIRATORY_TRACT | Status: DC
  Administered 2022-04-18 – 2022-04-19 (×3): 3 mL via RESPIRATORY_TRACT
  Filled 2022-04-18 (×3): qty 3

## 2022-04-18 NOTE — Assessment & Plan Note (Signed)
Patient with no antihypertensive medications at home, noted to be on furosemide.

## 2022-04-18 NOTE — Plan of Care (Signed)

## 2022-04-18 NOTE — TOC Progression Note (Signed)
Transition of Care [facility]) - Progression Note    Patient Details  Name: LAKYN ALSTEEN MRN: [id number] Date of Birth: [birth year]  Transition of Care [facility]) CM/SW Luce, RN Phone Number:[phone]  04/18/2022, 3:49 PM  Clinical Narrative:     Transition of Care [facility]) Screening Note   Patient Details  Name: AZAYLEA MAVES Date of Birth: 1960/10/09   Transition of Care [facility]) CM/SW Contact:    Angelita Ingles, RN Phone Number: 04/18/2022, 3:49 PM    Transition of Care Department [facility]) has reviewed patient and no TOC needs have been identified at this time. We will continue to monitor patient advancement through interdisciplinary progression rounds. If new patient transition needs arise, please place a TOC consult.          Expected Discharge Plan and Services                                                 Social Determinants of Health (SDOH) Interventions    Readmission Risk Interventions     No data to display

## 2022-04-18 NOTE — Assessment & Plan Note (Signed)
Patient with improvement in dyspnea but no back to her baseline.   Oxymetry is 98% on room air, no wheezing and no rales, with no cough or increase sputum production. Doubt acute bronchitis  Will continue with bronchodilator therapy and oxymetry monitoring Chest Ct with ground glass opacities, will check echocardiogram. She has right hiatal hernia and elevated hemidiaphragm, restrictive physiology.

## 2022-04-18 NOTE — Assessment & Plan Note (Signed)
Chronic anemia, cell count has been stable.

## 2022-04-18 NOTE — Assessment & Plan Note (Signed)
Metastatic disease to the bone.  She had a recent rash and was treated with systemic steroids with improvement. 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  Current therapy with Fasodex  Plan to follow up with oncology as outpatient

## 2022-04-18 NOTE — Assessment & Plan Note (Signed)
Calculated BMI is 37,2

## 2022-04-18 NOTE — Assessment & Plan Note (Signed)
No exacerbation ?

## 2022-04-18 NOTE — Hospital Course (Signed)
Mrs. Oldaker was admitted to the hospital with the working diagnosis of acute bronchitis.   61 yo female with the past medical history of metastatic breast cancer, chronic anemia and depression who presented with dyspnea. Reported 2 days of dyspnea with no cough or chest pain. Because of persistent symptoms she presented in the ED for further evaluation. On her initial physical examination her blood pressure was 120/86, HR 113, RR 18 and 02 saturation 98%, lungs with no wheezing or rhonchi, heart with S1 and S2 present and rhythmic, abdomen with no distention, no lower extremity edema.   Na 136, K 4,2 Cl 101 bicarbonate 27 glucose 103 bun 32 cr 0,80  Wbc 14.7 hgb 9,4 plt 472  Sars covid 19 negative   Chest radiograph with right hemidiaphragm elevation  Chest CT with bilateral ground glass opacities, no pulmonary embolism. Multiple ill defined hypodense liver lesions. Multiple gallstones. Moderate to large hiatal hernia.   Patient was placed on supportive medical therapy including bronchodilator therapy with improvement in her symptoms  She will be discharged home with as needed bronchodilator therapy and outpatient follow up. She may need outpatient pulmonary function testing.

## 2022-04-18 NOTE — Progress Notes (Signed)
OT Cancellation Note  Patient Details Name: Suzanne [name] MRN: [id number] DOB: 05-03-1961   Cancelled Treatment:    Reason Eval/Treat Not Completed: OT screen initiated.  Patient adamantly declined the need for therapy services, indicating she feels as though she is at her baseline level of functioning for self-care management, with no limitations or concerns in this regard. She politely asked for therapy to sign off.    Leota Sauers, OTR/L 04/18/2022, 4:11 PM

## 2022-04-18 NOTE — Assessment & Plan Note (Signed)
Continue current medical therapy 

## 2022-04-18 NOTE — Telephone Encounter (Addendum)
-----   Message from Benay Pike, MD sent at 04/16/2022  2:30 PM EST ----- Val, I dont see ER< PR and Her 2 on this sample. Can you check back with path on this.  This RN called to [facility] path lab and per [facility] in path- Breast Prognostic will be ordered and processed - with expected result next week.

## 2022-04-18 NOTE — Progress Notes (Signed)
  Progress Note   Patient: Suzanne [name] XVQ:[id number] DOB: 1960/06/16 DOA: 04/17/2022     1 DOS: the patient was seen and examined on 04/18/2022   Brief hospital course: Mrs. Suchan was admitted to the hospital with the working diagnosis of acute bronchitis.   61 yo female with the past medical history of metastatic breast cancer, chronic anemia and depression who presented with dyspnea. Reported 2 days of dyspnea with no cough or chest pain. Because of persistent symptoms she presented in the ED for further evaluation. On her initial physical examination her blood pressure was 120/86, HR 113, RR 18 and 02 saturation 98%, lungs with no wheezing or rhonchi, heart with S1 and S2 present and rhythmic, abdomen with no distention, no lower extremity edema.   Na 136, K 4,2 Cl 101 bicarbonate 27 glucose 103 bun 32 cr 0,80  Wbc 14.7 hgb 9,4 plt 472  Sars covid 19 negative   Chest radiograph with right hemidiaphragm elevation  Chest CT with bilateral ground glass opacities, no pulmonary embolism. Multiple ill defined hypodense liver lesions. Multiple gallstones. Moderate to large hiatal hernia.   Assessment and Plan: * Acute bronchitis Patient with improvement in dyspnea but no back to her baseline.   Oxymetry is 98% on room air, no wheezing and no rales, with no cough or increase sputum production. Doubt acute bronchitis  Will continue with bronchodilator therapy and oxymetry monitoring Chest Ct with ground glass opacities, will check echocardiogram. She has right hiatal hernia and elevated hemidiaphragm, restrictive physiology.   Malignant neoplasm metastatic to bone [facility]) Metastatic disease to the bone.  She had a recent rash and was treated with systemic steroids with improvement. Plan to follow up with oncology as outpatient   Anemia of chronic disease Chronic anemia, cell count has been stable.    Depression Continue current medical therapy   Essential hypertension Patient  with no antihypertensive medications at home, noted to be on furosemide.   Allergic rhinitis No exacerbation   Class 2 obesity Calculated BMI is 37,2         Subjective: Patient with no chest pain, no edema, dyspnea has improved.   Physical Exam: Vitals:   04/18/22 1002 04/18/22 1010 04/18/22 1350 04/18/22 1411  BP:   117/72   Pulse:   (!) 106   Resp:   17   Temp:  97.8 F (36.6 C)  98 F (36.7 C)  TempSrc:    Oral  SpO2: 97%  98%   Weight:      Height:       Neurology awake and alert ENT with mild pallor Cardiovascular with S1 and S2 present and rhythmic with no gallops, rubs or murmurs Respiratory with no rales or wheezing Abdomen with no distention  No lower extremity edema  Data Reviewed:    Family Communication: no family at the bedside   Disposition: Status is: Inpatient Remains inpatient appropriate because: workup for dyspnea   Planned Discharge Destination: Home    Author: Tawni Millers, MD 04/18/2022 2:59 PM  For on call review [email or web address].

## 2022-04-18 NOTE — Progress Notes (Incomplete)
  Echocardiogram 2D Echocardiogram has been performed.  Suzanne [name] 04/18/2022, 5:34 PM

## 2022-04-19 DIAGNOSIS — E669 Obesity, unspecified: Secondary | ICD-10-CM | POA: Diagnosis not present

## 2022-04-19 DIAGNOSIS — J40 Bronchitis, not specified as acute or chronic: Secondary | ICD-10-CM | POA: Diagnosis not present

## 2022-04-19 DIAGNOSIS — D638 Anemia in other chronic diseases classified elsewhere: Secondary | ICD-10-CM | POA: Diagnosis not present

## 2022-04-19 DIAGNOSIS — F32A Depression, unspecified: Secondary | ICD-10-CM | POA: Diagnosis not present

## 2022-04-19 DIAGNOSIS — C50811 Malignant neoplasm of overlapping sites of right female breast: Secondary | ICD-10-CM | POA: Diagnosis not present

## 2022-04-19 DIAGNOSIS — C7951 Secondary malignant neoplasm of bone: Secondary | ICD-10-CM | POA: Diagnosis not present

## 2022-04-19 DIAGNOSIS — J301 Allergic rhinitis due to pollen: Secondary | ICD-10-CM | POA: Diagnosis not present

## 2022-04-19 LAB — BASIC METABOLIC PANEL
Anion gap: 5 (ref 5–15)
BUN: 19 mg/dL (ref 8–23)
CO2: 28 mmol/L (ref 22–32)
Calcium: 8.1 mg/dL — ABNORMAL LOW (ref 8.9–10.3)
Chloride: 104 mmol/L (ref 98–111)
Creatinine, Ser: 0.77 mg/dL (ref 0.44–1.00)
GFR, Estimated: >60 mL/min (ref >60)
Glucose, Bld: 92 mg/dL (ref 70–99)
Potassium: 3.9 mmol/L (ref 3.5–5.1)
Sodium: 137 mmol/L (ref 135–145)

## 2022-04-19 MED ORDER — ALBUTEROL SULFATE (2.5 MG/3ML) 0.083% IN NEBU: 2.5000 mg | INHALATION_SOLUTION | RESPIRATORY_TRACT | Status: DC | PRN

## 2022-04-19 MED ORDER — IPRATROPIUM-ALBUTEROL 0.5-2.5 (3) MG/3ML IN SOLN: 3.0000 mL | Freq: Two times a day (BID) | RESPIRATORY_TRACT | Status: DC

## 2022-04-19 MED ORDER — IPRATROPIUM-ALBUTEROL 0.5-2.5 (3) MG/3ML IN SOLN: 3.0000 mL | Freq: Four times a day (QID) | RESPIRATORY_TRACT | 0 refills | Status: DC | PRN

## 2022-04-19 NOTE — Discharge Summary (Addendum)
Physician Discharge Summary   Patient: Suzanne [name] MRN: [id number] DOB: 1961-04-08  Admit date:     04/17/2022  Discharge date: 04/19/22  Discharge Physician: Jimmy Picket Kamren Heintzelman   PCP: [name], NP   Recommendations at discharge:    Added as needed bronchodilator therapy with albuterol and ipratropium.  Continue with home 02 supplementation per [place], keep 02 saturation 92% or greater. Follow up with Stephanie Coup NP in 7 to 10 days Consider outpatient full pulmonary function testing Continue with incentive spirometer.   Discharge Diagnoses: Principal Problem:   Acute bronchitis Active Problems:   Malignant neoplasm metastatic to bone (HCC)   Anemia of chronic disease   Depression   Essential hypertension   Allergic rhinitis   Class 2 obesity  Resolved Problems:   * No resolved hospital problems. [facility] Course: Suzanne [name] was admitted to the hospital with the working diagnosis of acute bronchitis.   61 yo female with the past medical history of metastatic breast cancer, chronic anemia and depression who presented with dyspnea. Reported 2 days of dyspnea with no cough or chest pain. Because of persistent symptoms she presented in the ED for further evaluation. On her initial physical examination her blood pressure was 120/86, HR 113, RR 18 and 02 saturation 98%, lungs with no wheezing or rhonchi, heart with S1 and S2 present and rhythmic, abdomen with no distention, no lower extremity edema.   Na 136, K 4,2 Cl 101 bicarbonate 27 glucose 103 bun 32 cr 0,80  Wbc 14.7 hgb 9,4 plt 472  Sars covid 19 negative   Chest radiograph with right hemidiaphragm elevation  Chest CT with bilateral ground glass opacities, no pulmonary embolism. Multiple ill defined hypodense liver lesions. Multiple gallstones. Moderate to large hiatal hernia.   Assessment and Plan: * Acute bronchitis Her symptoms has a significant improvement.  Oxymetry is 98% on 2 L/min per [facility]  examination with no wheezing or rhonchi, no rales.   Chest Ct with ground glass opacities, further workup with limited echocardiogram with preserved LV systolic function.  She has right hiatal hernia and elevated hemidiaphragm, restrictive physiology.   Plan to continue bronchodilator therapy as needed. Incentive spirometer  Follow up as outpatient for possible pulmonary function testing.   Malignant neoplasm metastatic to bone [facility]) Metastatic disease to the bone.  She had a recent rash and was treated with systemic steroids with improvement. 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  Current therapy with Fasodex  Plan to follow up with oncology as outpatient   Anemia of chronic disease Chronic anemia, cell count has been stable.    Depression Continue current medical therapy   Essential hypertension Patient with no antihypertensive medications at home, noted to be on furosemide.   Allergic rhinitis No exacerbation   Class 2 obesity Calculated BMI is 37,2         Consultants: none  Procedures performed: none  Disposition: Home Diet recommendation:  Discharge Diet Orders (From admission, onward)     Start     Ordered   04/19/22 0000  Diet - low sodium heart healthy        04/19/22 1024           Cardiac diet DISCHARGE MEDICATION: Allergies as of 04/19/2022       Reactions   Ferumoxytol Other (See Comments)   Pt had hypersensitivity to Ferumoxytol. She had Facial Flushing.  See note from 07/14/2021        Medication List     STOP taking these medications    ketoconazole 2 % cream Commonly known as: NIZORAL   methylPREDNISolone 4 MG Tbpk tablet Commonly known as: MEDROL DOSEPAK   nystatin powder Commonly known as: MYCOSTATIN/NYSTOP       TAKE these medications    acetaminophen 500 MG tablet Commonly known as: TYLENOL Take 1,000 mg by  mouth daily as needed for mild pain.   Advil Dual Action 125-250 MG Tabs Generic drug: Ibuprofen-Acetaminophen Take 2 tablets by mouth daily as needed (pain).   cetirizine 10 MG tablet Commonly known as: ZYRTEC Take 10 mg by mouth daily.   famotidine 20 MG tablet Commonly known as: PEPCID Take 1 tablet (20 mg total) by mouth 2 (two) times daily as needed for heartburn or indigestion. What changed: when to take this   FASLODEX IM Inject 1 Dose into the muscle every 30 (thirty) days.   fluconazole 200 MG tablet Commonly known as: DIFLUCAN Take 1 tablet (200 mg total) by mouth daily.   folic acid 1 MG tablet Commonly known as: FOLVITE Take 1 tablet (1 mg total) by mouth daily.   furosemide 20 MG tablet Commonly known as: LASIX Take 1 tablet (20 mg total) by mouth daily.   ipratropium-albuterol 0.5-2.5 (3) MG/3ML Soln Commonly known as: DUONEB Take 3 mLs by nebulization every 6 (six) hours as needed.   Melatonin TR 1 MG Tbcr Generic drug: Melatonin ER TAKE ONE TABLET BY MOUTH EVERYDAY AT BEDTIME What changed: See the new instructions.   ondansetron 8 MG tablet Commonly known as: ZOFRAN Take 1 tablet (8 mg total) by mouth every 8 (eight) hours as needed for nausea or vomiting. What changed: when to take this   [place] Calcium Plus D 500-5 MG-MCG Tabs Generic drug: Calcium Carb-Cholecalciferol TAKE ONE TABLET BY MOUTH EVERY MORNING and TAKE ONE TABLET BY MOUTH AT NOON and TAKE ONE TABLET BY MOUTH EVERY EVENING What changed: See the new instructions.   polyethylene glycol 17 g packet Commonly known as: MIRALAX / GLYCOLAX Take 17 g by mouth as needed. What changed:  when to take this reasons to take this   sertraline 50 MG tablet Commonly known as: ZOLOFT TAKE 1 AND 1/2 TABLETS BY MOUTH EVERYDAY AT BEDTIME What changed: See the new instructions.   triamcinolone ointment 0.5 % Commonly known as: KENALOG Apply 1 Application topically 2 (two) times  daily. What changed:  when to take this reasons to take this   TUMS PO Take 3 tablets by mouth as needed ([place]).   XGEVA [place] Inject 1 Dose into the skin every 30 (thirty) days.               Durable Medical Equipment  (From admission, onward)           Start     Ordered   04/19/22 0000  For home use only DME Nebulizer machine       Question Answer Comment  Patient needs a nebulizer to treat with the following condition Bronchitis   Length of Need 12 Months      04/19/22 1024            Discharge Exam: Filed Weights   04/17/22 1439 04/19/22 0500  Weight: 80.7 kg 81.5 kg   BP 139/81 (BP Location: Right Arm)   Pulse 95   Temp 98.5 F (36.9 C) (Oral)   Resp 18  Ht '4\' 10"'$  (1.473 m)   Wt 81.5 kg   LMP 05/28/2013 (Within Months)   SpO2 98%   BMI 37.55 kg/m   Patient is feeling better, dyspnea continue to improve   Neurology awake and alert ENT with mild pallor Cardiovascular with S1 and S2 present and rhythmic with no gallops, rubs or murmurs Respiratory with no rales, wheezing or rhonchi Abdomen protuberant with no distention No lower extremity edema   Condition at discharge: stable  The results of significant diagnostics from this hospitalization (including imaging, microbiology, ancillary and laboratory) are listed below for reference.   Imaging Studies: ECHOCARDIOGRAM LIMITED  Result Date: 04/18/2022    ECHOCARDIOGRAM LIMITED REPORT   Patient Name:   SHEMIA BEVEL Date of Exam: 04/18/2022 Medical Rec #:  [id number]       Height:       58.0 in Accession #:    [id number]      Weight:       178.0 lb Date of Birth:  February 23, 1961       BSA:          1.733 m Patient Age:    62 years        BP:           117/72 mmHg Patient Gender: F               HR:           89 bpm. Exam Location:  Inpatient Procedure: Limited Echo, Cardiac Doppler and Color Doppler Indications:    dyspnea  History:        Patient has prior history of Echocardiogram examinations,  most                 recent 12/08/2021.  Sonographer:    Harvie Junior Referring Phys: [id number] [place]  Sonographer Comments: Patient is obese. Image acquisition challenging due to patient body habitus. IMPRESSIONS  1. Left ventricular ejection fraction, by estimation, is 70 to 75%. The left ventricle has hyperdynamic function. Left ventricular diastolic parameters are consistent with Grade I diastolic dysfunction (impaired relaxation).  2. No evidence of mitral valve regurgitation.  3. The aortic valve was not well visualized. Aortic valve regurgitation is not visualized. No aortic stenosis is present. Comparison(s): No significant change from prior study. FINDINGS  Left Ventricle: Left ventricular ejection fraction, by estimation, is 70 to 75%. The left ventricle has hyperdynamic function. The left ventricular internal cavity size was small. There is no left ventricular hypertrophy. Left ventricular diastolic parameters are consistent with Grade I diastolic dysfunction (impaired relaxation). Pericardium: There is no evidence of pericardial effusion. Presence of epicardial fat layer. Tricuspid Valve: The tricuspid valve is grossly normal. Tricuspid valve regurgitation is not demonstrated. No evidence of tricuspid stenosis. Aortic Valve: The aortic valve was not well visualized. Aortic valve regurgitation is not visualized. No aortic stenosis is present. Aortic valve mean gradient measures 4.0 mmHg. Aortic valve peak gradient measures 8.4 mmHg. Aortic valve area, by VTI measures 2.16 cm. Pulmonic Valve: The pulmonic valve was normal in structure. Pulmonic valve regurgitation is not visualized. No evidence of pulmonic stenosis. LEFT VENTRICLE PLAX 2D LVIDd:         3.50 cm     Diastology LVIDs:         2.60 cm     LV e' medial:    5.77 cm/s LV PW:         0.90 cm     LV E/e' medial:  13.0 LV IVS:        0.80 cm     LV e' lateral:   10.70 cm/s LVOT diam:     1.70 cm     LV E/e' lateral: 7.0 LV SV:          56 LV SV Index:   32 LVOT Area:     2.27 cm  LV Volumes (MOD) LV vol d, MOD A2C: 47.2 ml LV vol d, MOD A4C: 59.5 ml LV vol s, MOD A2C: 14.9 ml LV vol s, MOD A4C: 22.5 ml LV SV MOD A2C:     32.3 ml LV SV MOD A4C:     59.5 ml LV SV MOD BP:      35.5 ml LEFT ATRIUM         Index LA diam:    2.90 cm 1.67 cm/m  AORTIC VALVE                    PULMONIC VALVE AV Area (Vmax):    2.16 cm     PV Vmax:       1.22 m/s AV Area (Vmean):   2.24 cm     PV Peak grad:  6.0 mmHg AV Area (VTI):     2.16 cm AV Vmax:           145.00 cm/s AV Vmean:          98.700 cm/s AV VTI:            0.259 m AV Peak Grad:      8.4 mmHg AV Mean Grad:      4.0 mmHg LVOT Vmax:         138.00 cm/s LVOT Vmean:        97.600 cm/s LVOT VTI:          0.247 m LVOT/AV VTI ratio: 0.95  AORTA Ao Root diam: 2.60 cm MITRAL VALVE MV Area (PHT): 3.72 cm    SHUNTS MV Decel Time: 204 msec    Systemic VTI:  0.25 m MV E velocity: 75.00 cm/s  Systemic Diam: 1.70 cm MV A velocity: 84.40 cm/s MV E/A ratio:  0.89 Rudean Haskell MD Electronically signed by Rudean Haskell MD Signature Date/Time: 04/18/2022/5:39:11 PM    Final    CT Angio Chest PE W and/or Wo Contrast  Result Date: 04/17/2022 CLINICAL DATA:  Weakness and dyspnea on exertion. History of metastatic breast cancer. EXAM: CT ANGIOGRAPHY CHEST WITH CONTRAST TECHNIQUE: Multidetector CT imaging of the chest was performed using the standard protocol during bolus administration of intravenous contrast. Multiplanar CT image reconstructions and MIPs were obtained to evaluate the vascular anatomy. RADIATION DOSE REDUCTION: This exam was performed according to the departmental dose-optimization program which includes automated exposure control, adjustment of the mA and/or kV according to patient size and/or use of iterative reconstruction technique. CONTRAST:  56m OMNIPAQUE IOHEXOL 350 MG/ML SOLN COMPARISON:  CT chest dated March 05, 2022. FINDINGS: Cardiovascular: Satisfactory opacification of the  pulmonary arteries to the segmental level. No evidence of pulmonary embolism. Normal heart size. No pericardial effusion. No thoracic aortic aneurysm. Coronary, aortic arch, and branch vessel atherosclerotic vascular disease. Mediastinum/Nodes: No enlarged mediastinal, hilar, or axillary lymph nodes. Thyroid gland, trachea, and esophagus demonstrate no significant findings. Lungs/Pleura: Relatively unchanged mosaic attenuation, more conspicuous on today's study compared to prior. Scattered subsegmental atelectasis/scarring in the right lung again noted. Trace bilateral pleural fluid again noted. No consolidation or pneumothorax. Upper Abdomen: No acute abnormality. Multiple ill-defined  hypodense liver lesions again noted. Multiple gallstones again noted. Unchanged moderate to large hiatal hernia. Musculoskeletal: Chronic severe elevation of the right hemidiaphragm. Diffuse osseous sclerosis again noted. Multiple old bilateral rib fractures. Chronic T11 and T12 compression deformities with focal kyphosis, unchanged. Review of the MIP images confirms the above findings. IMPRESSION: 1. No evidence of pulmonary embolism. 2. Relatively unchanged mosaic attenuation, more conspicuous on today's study compared to prior. This is nonspecific but can be seen with small airways disease. 3. Unchanged hepatic and osseous metastatic disease 4.  Aortic atherosclerosis (ICD10-I70.0). Electronically Signed   By: Titus Dubin M.D.   On: 04/17/2022 21:15   DG Chest Port 1 View  Result Date: 04/17/2022 CLINICAL DATA:  Tachycardia and shortness of breath EXAM: PORTABLE CHEST 1 VIEW COMPARISON:  Chest x-ray 12/06/2021.  Chest CT 03/05/2022. FINDINGS: There is stable moderate elevation of the right hemidiaphragm. Moderate-sized hiatal hernia is again seen. There is no focal lung infiltrate, pleural effusion or pneumothorax. Cardiomediastinal silhouette is within normal limits. Diffuse osseous sclerotic metastatic disease again  noted. No acute fractures are seen. There are multiple orthopedic screws in the proximal left humerus. IMPRESSION: 1. No active disease. 2. Stable moderate elevation of the right hemidiaphragm. 3. Moderate-sized hiatal hernia. Electronically Signed   By: Ronney Asters M.D.   On: 04/17/2022 17:04   US BIOPSY (LIVER)  Result Date: 03/28/2022 INDICATION: 61 year old with metastatic breast cancer. Progression of disease on recent imaging. Request for liver lesion biopsy for molecular testing. EXAM: ULTRASOUND-GUIDED LIVER LESION BIOPSY MEDICATIONS: Moderate sedation ANESTHESIA/SEDATION: Moderate (conscious) sedation was employed during this procedure. A total of Versed 1 mg and Fentanyl 50 mcg was administered intravenously by the radiology nurse. Total intra-service moderate Sedation Time: 18 minutes. The patient's level of consciousness and vital signs were monitored continuously by radiology nursing throughout the procedure under my direct supervision. FLUOROSCOPY TIME:  None COMPLICATIONS: None immediate. PROCEDURE: Informed written consent was obtained from the patient after a thorough discussion of the procedural risks, benefits and alternatives. All questions were addressed. Maximal Sterile Barrier Technique was utilized including caps, mask, sterile gowns, sterile gloves, sterile drape, hand hygiene and skin antiseptic. A timeout was performed prior to the initiation of the procedure. Liver was evaluated with ultrasound. Patient was placed on her left side. The right side of the abdomen was prepped with chlorhexidine and sterile field was created. Skin was anesthetized using 1% lidocaine. Small incision was made. Using ultrasound guidance, 17 gauge coaxial needle was directed into a hypoechoic lesion in the right hepatic lobe. Total of 6 core biopsies were performed with an 18 gauge device. Four specimens were obtained. Specimens placed in formalin. Gel-Foam slurry was injected as the 17 gauge needle was  removed. Bandage placed over the puncture site. FINDINGS: Several small hypoechoic lesions scattered throughout the liver. Right hepatic lesion was targeted. Biopsy needle was confirmed within the lesion. No immediate bleeding or hematoma formation. IMPRESSION: Ultrasound-guided core biopsy of a right hepatic lesion. Electronically Signed   By: Markus Daft M.D.   On: 03/28/2022 15:06    Microbiology: Results for orders placed or performed during the hospital encounter of 04/17/22  SARS Coronavirus 2 by RT PCR (hospital order, performed in [facility] hospital lab) *cepheid single result test* Anterior Nasal Swab     Status: None   Collection Time: 04/17/22 11:34 PM   Specimen: Anterior Nasal Swab  Result Value Ref Range Status   SARS Coronavirus 2 by RT PCR NEGATIVE NEGATIVE Final  Comment: (NOTE) SARS-CoV-2 target nucleic acids are NOT DETECTED.  The SARS-CoV-2 RNA is generally detectable in upper and lower respiratory specimens during the acute phase of infection. The lowest concentration of SARS-CoV-2 viral copies this assay can detect is 250 copies / mL. A negative result does not preclude SARS-CoV-2 infection and should not be used as the sole basis for treatment or other patient management decisions.  A negative result may occur with improper specimen collection / handling, submission of specimen other than nasopharyngeal swab, presence of viral mutation(s) within the areas targeted by this assay, and inadequate number of viral copies (<250 copies / mL). A negative result must be combined with clinical observations, patient history, and epidemiological information.  Fact Sheet for Patients:   https://www.patel.info/  Fact Sheet for Healthcare Providers: https://hall.com/  This test is not yet approved or  cleared by the [place] FDA and has been authorized for detection and/or diagnosis of SARS-CoV-2 by FDA under an Emergency Use  Authorization (EUA).  This EUA will remain in effect (meaning this test can be used) for the duration of the COVID-19 declaration under Section 564(b)(1) of the Act, 21 U.S.C. section 360bbb-3(b)(1), unless the authorization is terminated or revoked sooner.  Performed at [facility], [place] [address] [place], [place] [address]     Labs: CBC: Recent Labs  Lab 04/17/22 1454 04/18/22 0515  WBC 14.7* 13.1*  NEUTROABS  --  9.9*  HGB 9.4* 8.3*  HCT 31.0* 27.6*  MCV 101.6* 101.1*  PLT 472* 203   Basic Metabolic Panel: Recent Labs  Lab 04/17/22 1454 04/18/22 0515 04/19/22 0700  NA 136 137 137  K 4.2 4.7 3.9  CL 101 104 104  CO2 '27 27 28  '$ GLUCOSE 103* 121* 92  BUN 32* 27* 19  CREATININE 0.80 0.84 0.77  CALCIUM 8.3* 8.2* 8.1*  MG  --  2.3  --   PHOS  --  4.0  --    Liver Function Tests: Recent Labs  Lab 04/17/22 1454 04/18/22 0515  AST 36 37  ALT 29 26  ALKPHOS 122 107  BILITOT 0.6 0.6  PROT 6.8 6.3*  ALBUMIN 3.0* 2.9*   CBG: No results for input(s): "GLUCAP" in the last 168 hours.  Discharge time spent: greater than 30 minutes.  Signed: Tawni Millers, MD [facility] 04/19/2022

## 2022-04-19 NOTE — TOC Transition Note (Signed)
Transition of Care [facility]) - CM/SW Discharge Note   Patient Details  Name: KAYDRA BORGEN MRN: [id number] Date of Birth: 1960-07-02  [facility] CM/SW Contact:  Vassie Moselle, LCSW Phone Number: 04/19/2022, 10:55 AM   Clinical Narrative:    Nebulizer machine ordered through [employer] and will be shipped to pt's home.  Pt does not have transportation home and does not have O2 with her to take home. EMS transport has been arranged for pt.    Final next level of care: Home/Self Care Barriers to Discharge: No Barriers Identified   Patient Goals and CMS Choice Patient states their goals for this hospitalization and ongoing recovery are:: To go home CMS Medicare.gov Compare Post Acute Care list provided to:: Patient Choice offered to / list presented to : Patient  Discharge Placement                       Discharge Plan and Services In-house Referral: NA Discharge Planning Services: CM Consult Post Acute Care Choice: Durable Medical Equipment          DME Arranged: Nebulizer machine DME Agency: [employer] Date DME Agency Contacted: 04/19/22 Time DME Agency Contacted: [id number] Representative spoke with at DME Agency: [place] ([place]) Interventions     Readmission Risk Interventions    04/19/2022   10:53 AM  Readmission Risk Prevention Plan  Transportation Screening Complete  PCP or Specialist Appt within 5-7 Days Complete  Home Care Screening Complete  Medication Review (RN CM) Complete

## 2022-04-19 NOTE — Progress Notes (Signed)
[name] to be D/C'd per MD order. Discussed with the patient and all questions fully answered. ? VSS, Skin clean, dry and intact without evidence of skin break down, no evidence of skin tears noted. ? IV catheter discontinued intact. Site without signs and symptoms of complications. Dressing and pressure applied. ? An After Visit Summary was printed and given to the patient. Patient informed where to pickup prescriptions. ? D/c education completed with patient/family including follow up instructions, medication list, d/c activities limitations if indicated, with other d/c instructions as indicated by MD - patient able to verbalize understanding, all questions fully answered.  ? Patient instructed to return to ED, call 911, or call MD for any changes in condition.  Patient satisfied with return of all belongings including purse, clothing and cell phone. States oxygen tank has been previously taken home and does not have one at bedside.  ? Patient to be discharged via [facility]AR.

## 2022-04-20 ENCOUNTER — Inpatient Hospital Stay: Payer: Medicaid Other

## 2022-04-20 ENCOUNTER — Inpatient Hospital Stay: Payer: Medicaid Other | Admitting: Hematology and Oncology

## 2022-04-20 ENCOUNTER — Other Ambulatory Visit (HOSPITAL_COMMUNITY): Payer: Self-pay

## 2022-04-20 DIAGNOSIS — C50811 Malignant neoplasm of overlapping sites of right female breast: Secondary | ICD-10-CM | POA: Diagnosis not present

## 2022-04-20 LAB — SURGICAL PATHOLOGY

## 2022-04-20 NOTE — Telephone Encounter (Signed)
This RN spoke with pt today per noted d/c yesterday with plan for visit on 11/28 for evaluation and injection missed earlier this week.  Date and time given to pt with verbalized agreement.

## 2022-04-23 ENCOUNTER — Telehealth: Payer: Self-pay

## 2022-04-23 DIAGNOSIS — R6510 Systemic inflammatory response syndrome (SIRS) of non-infectious origin without acute organ dysfunction: Secondary | ICD-10-CM | POA: Diagnosis not present

## 2022-04-23 DIAGNOSIS — R0602 Shortness of breath: Secondary | ICD-10-CM | POA: Diagnosis not present

## 2022-04-23 DIAGNOSIS — J9621 Acute and chronic respiratory failure with hypoxia: Secondary | ICD-10-CM | POA: Diagnosis not present

## 2022-04-23 DIAGNOSIS — C7801 Secondary malignant neoplasm of right lung: Secondary | ICD-10-CM | POA: Diagnosis not present

## 2022-04-23 DIAGNOSIS — N19 Unspecified kidney failure: Secondary | ICD-10-CM | POA: Diagnosis not present

## 2022-04-23 DIAGNOSIS — J40 Bronchitis, not specified as acute or chronic: Secondary | ICD-10-CM | POA: Diagnosis not present

## 2022-04-23 DIAGNOSIS — C50811 Malignant neoplasm of overlapping sites of right female breast: Secondary | ICD-10-CM | POA: Diagnosis not present

## 2022-04-23 DIAGNOSIS — J309 Allergic rhinitis, unspecified: Secondary | ICD-10-CM | POA: Diagnosis not present

## 2022-04-23 NOTE — Patient Outreach (Signed)
Transition Care Management Follow-up Telephone Call Date of discharge and from where: 04/19/22 [facility] How have you been since you were released from the hospital? Good Any questions or concerns? No  Items Reviewed: Did the pt receive and understand the discharge instructions provided? Yes  Medications obtained and verified? Yes  Other? No  Any new allergies since your discharge? No  Dietary orders reviewed? No Do you have support at home? Yes   Home Care and Equipment/Supplies: Were home health services ordered? not applicable If so, what is the name of the agency?  Has the agency set up a time to come to the patient's home? not applicable Were any new equipment or medical supplies ordered?  No What is the name of the medical supply agency? Were you able to get the supplies/equipment? not applicable Do you have any questions related to the use of the equipment or supplies? No  Functional Questionnaire: (I = Independent and D = Dependent) ADLs: I  Bathing/Dressing- I  Meal Prep- I  Eating- I  Maintaining continence- I  Transferring/Ambulation- I  Managing Meds- I  Follow up appointments reviewed:  PCP Hospital f/u appt confirmed? No  Scheduled to see  on  @ . Suzanne [name] Hospital f/u appt confirmed? Yes  Scheduled to see [place] on 04/24/22 @ 1:45. Are transportation arrangements needed? No  If their condition worsens, is the pt aware to call PCP or go to the Emergency Dept.? Yes Was the patient provided with contact information for the PCP's office or ED? Yes Was to pt encouraged to call back with questions or concerns? Yes Mickel Fuchs, BSW, [place] Managed Medicaid Team  [phone]

## 2022-04-24 ENCOUNTER — Other Ambulatory Visit: Payer: Self-pay

## 2022-04-24 ENCOUNTER — Inpatient Hospital Stay: Payer: Medicaid Other

## 2022-04-24 ENCOUNTER — Inpatient Hospital Stay (HOSPITAL_BASED_OUTPATIENT_CLINIC_OR_DEPARTMENT_OTHER): Payer: Medicaid Other | Admitting: Hematology and Oncology

## 2022-04-24 ENCOUNTER — Telehealth: Payer: Self-pay | Admitting: Pharmacy Technician

## 2022-04-24 ENCOUNTER — Encounter: Payer: Self-pay | Admitting: Hematology and Oncology

## 2022-04-24 ENCOUNTER — Other Ambulatory Visit (HOSPITAL_COMMUNITY): Payer: Self-pay

## 2022-04-24 ENCOUNTER — Telehealth: Payer: Self-pay

## 2022-04-24 VITALS — BP 126/79 | HR 101 | Temp 97.7°F | Resp 16 | Ht <= 58 in | Wt 174.8 lb

## 2022-04-24 DIAGNOSIS — Z808 Family history of malignant neoplasm of other organs or systems: Secondary | ICD-10-CM | POA: Diagnosis not present

## 2022-04-24 DIAGNOSIS — Z170 Estrogen receptor positive status [ER+]: Secondary | ICD-10-CM

## 2022-04-24 DIAGNOSIS — Z800 Family history of malignant neoplasm of digestive organs: Secondary | ICD-10-CM | POA: Diagnosis not present

## 2022-04-24 DIAGNOSIS — L304 Erythema intertrigo: Secondary | ICD-10-CM | POA: Diagnosis not present

## 2022-04-24 DIAGNOSIS — C787 Secondary malignant neoplasm of liver and intrahepatic bile duct: Secondary | ICD-10-CM | POA: Diagnosis not present

## 2022-04-24 DIAGNOSIS — J90 Pleural effusion, not elsewhere classified: Secondary | ICD-10-CM

## 2022-04-24 DIAGNOSIS — C7951 Secondary malignant neoplasm of bone: Secondary | ICD-10-CM | POA: Diagnosis not present

## 2022-04-24 DIAGNOSIS — D518 Other vitamin B12 deficiency anemias: Secondary | ICD-10-CM

## 2022-04-24 DIAGNOSIS — Z79899 Other long term (current) drug therapy: Secondary | ICD-10-CM | POA: Diagnosis not present

## 2022-04-24 DIAGNOSIS — Z803 Family history of malignant neoplasm of breast: Secondary | ICD-10-CM | POA: Diagnosis not present

## 2022-04-24 DIAGNOSIS — C50811 Malignant neoplasm of overlapping sites of right female breast: Secondary | ICD-10-CM | POA: Diagnosis present

## 2022-04-24 DIAGNOSIS — Z5111 Encounter for antineoplastic chemotherapy: Secondary | ICD-10-CM | POA: Diagnosis not present

## 2022-04-24 LAB — CBC WITH DIFFERENTIAL (CANCER CENTER ONLY)
Abs Immature Granulocytes: 0.03 10*3/uL (ref 0.00–0.07)
Basophils Absolute: 0.1 10*3/uL (ref 0.0–0.1)
Basophils Relative: 1 %
Eosinophils Absolute: 0.2 10*3/uL (ref 0.0–0.5)
Eosinophils Relative: 3 %
HCT: 27.3 % — ABNORMAL LOW (ref 36.0–46.0)
Hemoglobin: 8.3 g/dL — ABNORMAL LOW (ref 12.0–15.0)
Immature Granulocytes: 0 %
Lymphocytes Relative: 12 %
Lymphs Abs: 1 10*3/uL (ref 0.7–4.0)
MCH: 30 pg (ref 26.0–34.0)
MCHC: 30.4 g/dL (ref 30.0–36.0)
MCV: 98.6 fL (ref 80.0–100.0)
Monocytes Absolute: 0.6 10*3/uL (ref 0.1–1.0)
Monocytes Relative: 7 %
Neutro Abs: 6.5 10*3/uL (ref 1.7–7.7)
Neutrophils Relative %: 77 %
Platelet Count: 365 10*3/uL (ref 150–400)
RBC: 2.77 MIL/uL — ABNORMAL LOW (ref 3.87–5.11)
RDW: 17.6 % — ABNORMAL HIGH (ref 11.5–15.5)
WBC Count: 8.4 10*3/uL (ref 4.0–10.5)
nRBC: 0 % (ref 0.0–0.2)

## 2022-04-24 LAB — CMP (CANCER CENTER ONLY)
ALT: 22 U/L (ref 0–44)
AST: 30 U/L (ref 15–41)
Albumin: 3.8 g/dL (ref 3.5–5.0)
Alkaline Phosphatase: 139 U/L — ABNORMAL HIGH (ref 38–126)
Anion gap: 5 (ref 5–15)
BUN: 16 mg/dL (ref 8–23)
CO2: 32 mmol/L (ref 22–32)
Calcium: 9.8 mg/dL (ref 8.9–10.3)
Chloride: 103 mmol/L (ref 98–111)
Creatinine: 0.87 mg/dL (ref 0.44–1.00)
GFR, Estimated: >60 mL/min (ref >60)
Glucose, Bld: 108 mg/dL — ABNORMAL HIGH (ref 70–99)
Potassium: 3.8 mmol/L (ref 3.5–5.1)
Sodium: 140 mmol/L (ref 135–145)
Total Bilirubin: 0.4 mg/dL (ref 0.3–1.2)
Total Protein: 7.2 g/dL (ref 6.5–8.1)

## 2022-04-24 LAB — VITAMIN B12: Vitamin B-12: 977 pg/mL — ABNORMAL HIGH (ref 180–914)

## 2022-04-24 LAB — FERRITIN: Ferritin: 37 ng/mL (ref 11–307)

## 2022-04-24 MED ORDER — DENOSUMAB 120 MG/1.7ML ~~LOC~~ SOLN
120.0000 mg | Freq: Once | SUBCUTANEOUS | Status: AC
  Administered 2022-04-24: 120 mg via SUBCUTANEOUS
  Filled 2022-04-24: qty 1.7

## 2022-04-24 MED ORDER — ELACESTRANT HYDROCHLORIDE 345 MG PO TABS
345.0000 mg | ORAL_TABLET | Freq: Every day | ORAL | 3 refills | Status: DC
  Filled 2022-04-24: qty 30, 30d supply, fill #0

## 2022-04-24 MED ORDER — FULVESTRANT 250 MG/5ML IM SOSY
500.0000 mg | PREFILLED_SYRINGE | Freq: Once | INTRAMUSCULAR | Status: AC
  Administered 2022-04-24: 500 mg via INTRAMUSCULAR
  Filled 2022-04-24: qty 10

## 2022-04-24 NOTE — Telephone Encounter (Signed)
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  Labs from 04/24/22 (CBC, CMP) assessed, no interventions needed. Prescription dose and frequency assessed.  Current medication list in Epic reviewed, DDIs with Orserdu identified: - fluconazole: will discuss with MD about discontinuation of fluconazole before patient starts the Orserdu.   Evaluated chart and no patient barriers to medication adherence noted.   Patient agreement for treatment documented in MD note on 04/24/22.  Prescription has been e-scribed to the [facility] for benefits analysis and approval.  Oral Oncology Clinic will continue to follow for insurance authorization, copayment issues, initial counseling and start date.  Drema Halon, PharmD Hematology/Oncology Clinical Pharmacist [facility] [phone] 04/24/2022 1:43 PM

## 2022-04-24 NOTE — Progress Notes (Signed)
Suzanne [name]:    [name], NP [address] [place] [address]  DIAGNOSIS: Metastatic breast cancer  SUMMARY OF ONCOLOGIC HISTORY: [place], [place] woman presenting 10/26/2018 with right-sided inflammatory breast cancer, stage IV, involving lungs, lymph nodes and bones, as follows:             (a) chest CT scan 10/26/2018 shows bilateral pleural effusions, possible lymphangitic spread of tumor, diffuse bony metastatic disease, and significant axillary mediastinal and hilar adenopathy             (b) bone scan 10/27/2018 is a "near SuperScan" consistent with widespread bony metastatic disease             (c) head CT with and without contrast 10/30/2018 shows no intracranial metastatic disease, multiple calvarial lesions             (d) CA-27-29 on 10/27/2018 was 1810.4   (1) pleural fluid from right thoracentesis 10/28/2018 confirms malignant cells consistent with a breast primary, strongly estrogen and progesterone receptor positive, HER-2 not amplified, with an MIB-1 of 2%   (2) anastrozole started 10/29/2018             (a) palbociclib started 11/13/2018 at 100 mg dose             (b) denosumab/xgeva started 11/13/2018              (C) palbociclib dose decreased to 23m             (D) Palbociclib dose decreased to 75 mg every other day in 11/2021   (3) associated problems:             (a) hypoxia secondary to effusions             (b) pain from bone lesions and left humeral and vertebral compression fractures             (c) right upper extremity lymphedema             (d) poor venous access   (4) genetics testing on 12/25/2018 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 STK11, TP53, TSC1, TSC2, and VHL.  The following genes were evaluated for sequence changes only: SDHA and HOXB13 c.251G>A variant only. The [facility] Melanoma Panel analyzed the following 9 genes: BAP1 BRCA2 CDK4 CDKN2A MITF POT1 PTEN RB1 Tp53.    (5) palliative radiation to the left humerus 07/20/2019 - 07/31/2019 Site Technique Total Dose (Gy) Dose per Fx (Gy) Completed Fx Beam Energies  Humerus, Left: Ext_Lt_humerus Complex 30/30 3 10/10 6X    (6) anemia requiring transfusion:              (a) B-12 level 134 FEB 2022: B-12 injectons started              (B) iron deficient, IV iron to start 2/6    CURRENT THERAPY: Anastrozole, [facility] Xgeva (holding Ibrance 01/08/2022)  INTERVAL HISTORY:  Patient is here for  follow-[name] with her daughter.   Since last visit, she had liver biopsy. She recently saw Mendel Ryder with worsening skin rash. She also had to go to the ED with SOB. She however has recovered and is awaiting to see dermatology. Daughter also mentions that they had difficulty getting nebulizer in place. Rest of the pertinent 10 point ROS reviewed and negative  Patient Active Problem List   Diagnosis Date Noted   Acute prerenal azotemia 04/18/2022   Dehydration 04/18/2022   SIRS (systemic inflammatory response syndrome) (HCC) 04/18/2022   Allergic rhinitis 04/18/2022   Depression 04/18/2022   Anemia of chronic disease 04/18/2022   Bronchitis 04/18/2022   Acute bronchitis 04/17/2022   Intertrigo 01/10/2022   Bacterial vaginitis 01/10/2022   Sepsis due to cellulitis ([place]) 12/06/2021   B12 deficiency anemia 08/15/2020   Sensorineural hearing loss (SNHL) of both ears 08/07/2019   Intractable back pain 02/03/2019    Thoracic compression fracture, closed, initial encounter ([place]) 02/03/2019   Essential hypertension 02/03/2019   Genetic testing 01/27/2019   Acute pharyngitis    Bacteremia    FTT (failure to thrive) in adult    Fracture closed, humerus, shaft 01/09/2019   Pneumonia 12/25/2018   Acute respiratory disorder in immunocompromised patient ([place]) 12/25/2018   Family history of breast cancer    Family history of prostate cancer    Family history of melanoma    Family history of colon cancer    Pressure injury of skin 11/24/2018   Chronic respiratory failure with hypoxia and hypercapnia ([place]) 11/19/2018   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   Hypophosphatemia 11/19/2018   Hypocalcemia 11/19/2018   Aspiration pneumonia (HCC) 11/19/2018   SOB (shortness of breath)    Palliative care by specialist    Carcinoma of breast, estrogen receptor positive, stage 4, right ([place]) 11/16/2018   Hypoalbuminemia 11/16/2018   Pleural effusion 11/14/2018   Goals of care, counseling/discussion 11/06/2018   Malignant neoplasm of overlapping sites of right breast in female, estrogen receptor positive ([place]) 10/31/2018   Lung metastasis 10/31/2018   Pain from bone metastases ([place]) 10/31/2018   Class 2 obesity 10/31/2018   Bone injury    Pleural effusion, bilateral    Shortness of breath    Abnormal breast finding    Hypokalemia    Breast skin changes    Macrocytic anemia    Malignant neoplasm metastatic to bone (HCC)    Anemia, B12 deficiency 10/26/2018    is allergic to ferumoxytol.  MEDICAL HISTORY: Past Medical History:  Diagnosis Date   Cancer [facility])    Family history of breast cancer    Family history of colon cancer    Family history of melanoma    Family history of prostate cancer    Gallstones    Glaucoma    Mastitis    reports history of recurrent mastitis   Metastatic breast cancer    Obesity     SURGICAL HISTORY: Past Surgical History:  Procedure Laterality Date   GLAUCOMA  SURGERY  late 1990's   IR THORACENTESIS ASP PLEURAL SPACE W/IMG GUIDE  10/28/2018   IR THORACENTESIS ASP PLEURAL SPACE W/IMG GUIDE  10/29/2018   OPEN REDUCTION INTERNAL FIXATION (ORIF) DISTAL RADIAL FRACTURE Left 01/09/2019   Procedure: OPEN REDUCTION INTERNAL FIXATION (ORIF) HUMERAL FRACTURE;  Surgeon: Netta Cedars, MD;  Location: [facility];  Service: Orthopedics;  Laterality: Left;    SOCIAL HISTORY: Social History   Socioeconomic History   Marital status: Widowed    Spouse name: Not on file  Number of children: 1   Years of education: Not on file   Highest education level: Not on file  Occupational History   Occupation: Clinical research associate Programs    Comment: Self-employed  Tobacco Use   Smoking status: Never   Smokeless tobacco: Never   Tobacco comments:    second hand smoke exposure  Vaping Use   Vaping Use: Never used  Substance and Sexual Activity   Alcohol use: No   Drug use: No   Sexual activity: Not Currently  Other Topics Concern   Not on file  Social History Narrative   Has a daughter named [name]. Daughter has CP.   Social Determinants of Health   Financial Resource Strain: Low Risk  (08/16/2021)   Overall Financial Resource Strain (CARDIA)    Difficulty of Paying Living Expenses: Not hard at all  Food Insecurity: No Food Insecurity (04/23/2022)   Hunger Vital Sign    Worried About Running Out of Food in the Last Year: Never true    Ran Out of Food in the Last Year: Never true  Transportation Needs: No Transportation Needs (04/23/2022)   PRAPARE - Hydrologist (Medical): No    Lack of Transportation (Non-Medical): No  Physical Activity: Not on file  Stress: Stress Concern Present (11/08/2021)   [place]    Feeling of Stress : To some extent  Social Connections: Socially Isolated (03/27/2021)   Social Connection and Isolation Panel [NHANES]    Frequency of  Communication with Friends and Family: Once a week    Frequency of Social Gatherings with Friends and Family: More than three times a week    Attends Religious Services: Never    Marine scientist or Organizations: No    Attends Archivist Meetings: Never    Marital Status: Widowed  Intimate Partner Violence: Not At Risk (12/24/2018)   Humiliation, Afraid, Rape, and Kick questionnaire    Fear of Current or Ex-Partner: No    Emotionally Abused: No    Physically Abused: No    Sexually Abused: No    FAMILY HISTORY: Family History  Problem Relation Age of Onset   Breast cancer Sister        in her 26's-70s   Heart disease Brother        CABG   Heart disease Brother        CABG   Skin cancer Brother        melanoma   Colon cancer Cousin    Heart attack Mother    Heart attack Father    Prostate cancer Brother     Review of Systems  Constitutional:  Positive for fatigue. Negative for appetite change, chills, fever and unexpected weight change.  HENT:   Negative for hearing loss, lump/mass and trouble swallowing.   Eyes:  Negative for eye problems and icterus.  Respiratory:  Negative for chest tightness, cough and shortness of breath.   Cardiovascular:  Negative for chest pain, leg swelling and palpitations.  Gastrointestinal:  Negative for abdominal distention, abdominal pain, constipation, diarrhea, nausea and vomiting.  Endocrine: Negative for hot flashes.  Genitourinary:  Negative for difficulty urinating.   Musculoskeletal:  Negative for arthralgias.  Skin:  Positive for rash. Negative for itching.  Neurological:  Negative for dizziness, extremity weakness, headaches and numbness.  Hematological:  Negative for adenopathy. Does not bruise/bleed easily.  Psychiatric/Behavioral:  Negative for depression. The patient is not nervous/anxious.  PHYSICAL EXAMINATION  ECOG PERFORMANCE STATUS: 2 - Symptomatic, <50% confined to bed  Vitals:   04/24/22 1306   BP: 126/79  Pulse: (!) 101  Resp: 16  Temp: 97.7 F (36.5 C)  SpO2: 100%   General: She appears alert, in wheel chair Skin rash as in her chart is extensive and I agree with derm eval No new LE edema.  LABORATORY DATA:  CBC    Component Value Date/Time   WBC 8.4 04/24/2022 1249   WBC 13.1 (H) 04/18/2022 0515   RBC 2.77 (L) 04/24/2022 1249   HGB 8.3 (L) 04/24/2022 1249   HCT 27.3 (L) 04/24/2022 1249   PLT 365 04/24/2022 1249   MCV 98.6 04/24/2022 1249   MCH 30.0 04/24/2022 1249   MCHC 30.4 04/24/2022 1249   RDW 17.6 (H) 04/24/2022 1249   LYMPHSABS 1.0 04/24/2022 1249   MONOABS 0.6 04/24/2022 1249   EOSABS 0.2 04/24/2022 1249   BASOSABS 0.1 04/24/2022 1249    CMP     Component Value Date/Time   NA 137 04/19/2022 0700   K 3.9 04/19/2022 0700   CL 104 04/19/2022 0700   CO2 28 04/19/2022 0700   GLUCOSE 92 04/19/2022 0700   BUN 19 04/19/2022 0700   CREATININE 0.77 04/19/2022 0700   CREATININE 0.80 03/22/2022 1245   CALCIUM 8.1 (L) 04/19/2022 0700   PROT 6.3 (L) 04/18/2022 0515   ALBUMIN 2.9 (L) 04/18/2022 0515   AST 37 04/18/2022 0515   AST 26 03/22/2022 1245   ALT 26 04/18/2022 0515   ALT 14 03/22/2022 1245   ALKPHOS 107 04/18/2022 0515   BILITOT 0.6 04/18/2022 0515   BILITOT 0.5 03/22/2022 1245   GFRNONAA >60 04/19/2022 0700   GFRNONAA >60 03/22/2022 1245   GFRAA >60 02/24/2020 1454   GFRAA >60 07/24/2019 1453      ASSESSMENT and THERAPY PLAN:  Suzanne [name] is a 61 y.o. female who presents to the clinic for follow [name] for metastatic breast cancer to the bone.   #Metastatic breast cancer involving the bone:  S/P first line Palbociclib with AI. We started her on Faslodex while we were awaiting molecular testing. She was on Ibrance for almost 2 yrs. We have suspected that there could be some progression ongoing given her rising tumor markers and since she has been on this combination for almost 2+ years. Biopsy confirmed metastatic breast cancer ER, PR  positive, Her 2 neg. Foundation one testing showed MSS, TMB 42mts/Mb, ESR1 mutation, AKT 1 mutation We discussed about trying Orserdu for next line of treatment. Other possible therapies include AKT inhibitor with fulvestrant for next line followed by single agent chemotherapy. She is agreeable to this plan.  Return to clinic in approximately 4 weeks,  I think she has intertrigo and she has been on multiple courses of antifungals, Unfortunately we cannot continue fluconazole once she starts elacestrant. I sent an in basket message to our nursing team to call the patient and let her know of this. I am agreeable to derm consultation placed by our NP.  All questions were answered. The patient knows to call the clinic with any problems, questions or concerns. We can certainly see the patient much sooner if necessary.  I have spent a total of 30 minutes minutes of face-to-face and non-face-to-face time, preparing to see the p[place] medically appropriate examination, counseling and educating the patient, documenting clinical information in the electronic health record, and care coordination.

## 2022-04-24 NOTE — Telephone Encounter (Signed)
Oral Oncology Patient Advocate Encounter  After completing a benefits investigation, prior authorization for Orserdu is not required at this time through [facility] [place].  Patient's copay is $4.     Lady Deutscher, CPhT-Adv Oncology Pharmacy Patient [place] Direct Number: [phone]  Fax: [phone]

## 2022-04-24 NOTE — Progress Notes (Signed)
START ON PATHWAY REGIMEN - Breast     A cycle is every 28 days:     Elacestrant   **Always confirm dose/schedule in your pharmacy ordering system**  Patient Characteristics: Distant Metastases or Locoregional Recurrent Disease - Unresected or Locally Advanced Unresectable Disease Progressing after Neoadjuvant and Local Therapies, ER Positive, Endocrine Therapy, Postmenopausal, Second Line, Not a Candidate for a CDK4/6  Inhibitor, ESR1 Mutated Therapeutic Status: Distant Metastases HER2 Status: Negative (-) ER Status: Positive (+) PR Status: Positive (+) Therapy Approach Indicated: Standard Chemotherapy/Endocrine Therapy Menopausal Status: Postmenopausal Line of Therapy: Second Line [id number] Mutation Status: Wild-Type (no mutation) ESR1 Mutation Status: Positive Intent of Therapy: Non-Curative / Palliative Intent, Discussed with Patient

## 2022-04-25 ENCOUNTER — Telehealth: Payer: Self-pay

## 2022-04-25 DIAGNOSIS — C50811 Malignant neoplasm of overlapping sites of right female breast: Secondary | ICD-10-CM | POA: Diagnosis not present

## 2022-04-25 LAB — CANCER ANTIGEN 27.29: CA 27.29: 1007.8 U/mL — ABNORMAL HIGH (ref 0.0–38.6)

## 2022-04-25 MED ORDER — ELACESTRANT HYDROCHLORIDE 345 MG PO TABS: 345.0000 mg | ORAL_TABLET | Freq: Every day | ORAL | 3 refills | Status: DC

## 2022-04-25 NOTE — Telephone Encounter (Signed)
-----   Message from Benay Pike, MD sent at 04/24/2022 11:17 PM EST ----- Suzanne [name]  Please call Patient and ask her to stop fluconazole when she has to start Orserdu.  Thanks,

## 2022-04-25 NOTE — Telephone Encounter (Signed)
Pt made aware of this per MD. She verbalized thanks and understanding.

## 2022-04-26 ENCOUNTER — Encounter (HOSPITAL_COMMUNITY): Payer: Self-pay

## 2022-04-26 DIAGNOSIS — C50811 Malignant neoplasm of overlapping sites of right female breast: Secondary | ICD-10-CM | POA: Diagnosis not present

## 2022-04-26 NOTE — Telephone Encounter (Signed)
Oral Chemotherapy Pharmacist Encounter   I spoke with patient for overview of: Orserdu (elacestrant) for the  treatment of ESR1 positive, HR positive, HER2 negative metastatic breast cancer, planned duration until disease progression or unacceptable drug toxicity.   Counseled patient on administration, dosing, side effects, monitoring, drug-food interactions, safe handling, storage, and disposal.   Patient will take Orserdu 326m, 1 tablet by mouth once daily with food.    Patient will avoid grapefruit or grapefruit juice.   Patient is stopping the fluconazole on 04/29/2022.    Orserdu start date: 04/30/2022   Adverse effects include but are not limited to: nausea, vomiting, muscle or joint paint, joint weakness, and increased cholesterol levels.   Patient has anti-emetic on hand and knows to take it if nausea develops.     Reviewed with patient importance of keeping a medication schedule and plan for any missed doses. No barriers to medication adherence identified.   Medication reconciliation performed and medication/allergy list updated.  Patient receives medication through [facility]   All questions answered.   Patient and son voiced understanding and appreciation.    Medication education handout placed in mail for patient. Patient knows to call the office with questions or concerns. Oral Chemotherapy Clinic phone number provided to patient.   KDrema Halon PharmD Hematology/Oncology Clinical Pharmacist [phone]

## 2022-04-27 DIAGNOSIS — J309 Allergic rhinitis, unspecified: Secondary | ICD-10-CM | POA: Diagnosis not present

## 2022-04-27 DIAGNOSIS — S22000A Wedge compression fracture of unspecified thoracic vertebra, initial encounter for closed fracture: Secondary | ICD-10-CM | POA: Diagnosis not present

## 2022-04-27 DIAGNOSIS — C50811 Malignant neoplasm of overlapping sites of right female breast: Secondary | ICD-10-CM | POA: Diagnosis not present

## 2022-04-27 DIAGNOSIS — R0602 Shortness of breath: Secondary | ICD-10-CM | POA: Diagnosis not present

## 2022-04-27 DIAGNOSIS — R6510 Systemic inflammatory response syndrome (SIRS) of non-infectious origin without acute organ dysfunction: Secondary | ICD-10-CM | POA: Diagnosis not present

## 2022-04-27 DIAGNOSIS — N19 Unspecified kidney failure: Secondary | ICD-10-CM | POA: Diagnosis not present

## 2022-04-27 DIAGNOSIS — J9621 Acute and chronic respiratory failure with hypoxia: Secondary | ICD-10-CM | POA: Diagnosis not present

## 2022-04-27 DIAGNOSIS — J40 Bronchitis, not specified as acute or chronic: Secondary | ICD-10-CM | POA: Diagnosis not present

## 2022-04-27 DIAGNOSIS — C7801 Secondary malignant neoplasm of right lung: Secondary | ICD-10-CM | POA: Diagnosis not present

## 2022-04-30 DIAGNOSIS — C50811 Malignant neoplasm of overlapping sites of right female breast: Secondary | ICD-10-CM | POA: Diagnosis not present

## 2022-05-01 DIAGNOSIS — C50811 Malignant neoplasm of overlapping sites of right female breast: Secondary | ICD-10-CM | POA: Diagnosis not present

## 2022-05-02 ENCOUNTER — Telehealth: Payer: Self-pay

## 2022-05-02 DIAGNOSIS — C50811 Malignant neoplasm of overlapping sites of right female breast: Secondary | ICD-10-CM | POA: Diagnosis not present

## 2022-05-02 NOTE — Telephone Encounter (Signed)
Attempted to call pt in regards to need for nebulizer. No VM set up, phone rang continuously with no answer.

## 2022-05-03 ENCOUNTER — Other Ambulatory Visit: Payer: Self-pay

## 2022-05-03 DIAGNOSIS — J90 Pleural effusion, not elsewhere classified: Secondary | ICD-10-CM

## 2022-05-03 DIAGNOSIS — J910 Malignant pleural effusion: Secondary | ICD-10-CM

## 2022-05-03 DIAGNOSIS — C7951 Secondary malignant neoplasm of bone: Secondary | ICD-10-CM

## 2022-05-03 DIAGNOSIS — C50811 Malignant neoplasm of overlapping sites of right female breast: Secondary | ICD-10-CM | POA: Diagnosis not present

## 2022-05-03 DIAGNOSIS — J690 Pneumonitis due to inhalation of food and vomit: Secondary | ICD-10-CM

## 2022-05-03 DIAGNOSIS — C7800 Secondary malignant neoplasm of unspecified lung: Secondary | ICD-10-CM

## 2022-05-03 DIAGNOSIS — J069 Acute upper respiratory infection, unspecified: Secondary | ICD-10-CM

## 2022-05-03 DIAGNOSIS — R0602 Shortness of breath: Secondary | ICD-10-CM

## 2022-05-03 DIAGNOSIS — J9611 Chronic respiratory failure with hypoxia: Secondary | ICD-10-CM

## 2022-05-03 MED ORDER — IPRATROPIUM-ALBUTEROL 0.5-2.5 (3) MG/3ML IN SOLN: 3.0000 mL | Freq: Four times a day (QID) | RESPIRATORY_TRACT | 0 refills | Status: AC | PRN

## 2022-05-04 DIAGNOSIS — C50811 Malignant neoplasm of overlapping sites of right female breast: Secondary | ICD-10-CM | POA: Diagnosis not present

## 2022-05-07 ENCOUNTER — Telehealth: Payer: Self-pay

## 2022-05-07 DIAGNOSIS — C50811 Malignant neoplasm of overlapping sites of right female breast: Secondary | ICD-10-CM | POA: Diagnosis not present

## 2022-05-07 NOTE — Telephone Encounter (Signed)
Daughter Suzanne [name] called to review potential side effects Orserdu. Patient stable at this time without complaints.

## 2022-05-08 ENCOUNTER — Other Ambulatory Visit (HOSPITAL_COMMUNITY): Payer: Self-pay

## 2022-05-08 DIAGNOSIS — C50811 Malignant neoplasm of overlapping sites of right female breast: Secondary | ICD-10-CM | POA: Diagnosis not present

## 2022-05-09 DIAGNOSIS — C50811 Malignant neoplasm of overlapping sites of right female breast: Secondary | ICD-10-CM | POA: Diagnosis not present

## 2022-05-10 DIAGNOSIS — C50811 Malignant neoplasm of overlapping sites of right female breast: Secondary | ICD-10-CM | POA: Diagnosis not present

## 2022-05-11 DIAGNOSIS — C50811 Malignant neoplasm of overlapping sites of right female breast: Secondary | ICD-10-CM | POA: Diagnosis not present

## 2022-05-14 DIAGNOSIS — C50811 Malignant neoplasm of overlapping sites of right female breast: Secondary | ICD-10-CM | POA: Diagnosis not present

## 2022-05-15 DIAGNOSIS — C50811 Malignant neoplasm of overlapping sites of right female breast: Secondary | ICD-10-CM | POA: Diagnosis not present

## 2022-05-16 DIAGNOSIS — C50811 Malignant neoplasm of overlapping sites of right female breast: Secondary | ICD-10-CM | POA: Diagnosis not present

## 2022-05-17 DIAGNOSIS — C50811 Malignant neoplasm of overlapping sites of right female breast: Secondary | ICD-10-CM | POA: Diagnosis not present

## 2022-05-18 DIAGNOSIS — C50811 Malignant neoplasm of overlapping sites of right female breast: Secondary | ICD-10-CM | POA: Diagnosis not present

## 2022-05-22 DIAGNOSIS — C50811 Malignant neoplasm of overlapping sites of right female breast: Secondary | ICD-10-CM | POA: Diagnosis not present

## 2022-05-23 DIAGNOSIS — C50811 Malignant neoplasm of overlapping sites of right female breast: Secondary | ICD-10-CM | POA: Diagnosis not present

## 2022-05-25 ENCOUNTER — Ambulatory Visit: Payer: Medicaid Other | Admitting: Adult Health

## 2022-05-25 ENCOUNTER — Other Ambulatory Visit: Payer: Medicaid Other

## 2022-05-25 ENCOUNTER — Ambulatory Visit: Payer: Medicaid Other

## 2022-05-27 ENCOUNTER — Inpatient Hospital Stay (HOSPITAL_COMMUNITY): Payer: Medicaid Other | Admitting: Anesthesiology

## 2022-05-27 ENCOUNTER — Encounter (HOSPITAL_COMMUNITY)
Admission: EM | Disposition: A | Discharge status: Home / Self Care | Payer: Self-pay | Source: Home / Self Care | Attending: Internal Medicine

## 2022-05-27 ENCOUNTER — Encounter (HOSPITAL_COMMUNITY): Payer: Self-pay

## 2022-05-27 ENCOUNTER — Other Ambulatory Visit: Payer: Self-pay

## 2022-05-27 ENCOUNTER — Emergency Department (HOSPITAL_COMMUNITY): Payer: Medicaid Other

## 2022-05-27 ENCOUNTER — Inpatient Hospital Stay (HOSPITAL_COMMUNITY)
Admission: EM | Admit: 2022-05-27 | Discharge: 2022-05-30 | DRG: 378 | Disposition: A | Discharge status: Home / Self Care | Payer: Medicaid Other | Attending: Internal Medicine | Admitting: Internal Medicine

## 2022-05-27 DIAGNOSIS — Z9221 Personal history of antineoplastic chemotherapy: Secondary | ICD-10-CM

## 2022-05-27 DIAGNOSIS — Z23 Encounter for immunization: Secondary | ICD-10-CM

## 2022-05-27 DIAGNOSIS — Z800 Family history of malignant neoplasm of digestive organs: Secondary | ICD-10-CM

## 2022-05-27 DIAGNOSIS — K3189 Other diseases of stomach and duodenum: Secondary | ICD-10-CM | POA: Diagnosis not present

## 2022-05-27 DIAGNOSIS — C787 Secondary malignant neoplasm of liver and intrahepatic bile duct: Secondary | ICD-10-CM | POA: Diagnosis not present

## 2022-05-27 DIAGNOSIS — I10 Essential (primary) hypertension: Secondary | ICD-10-CM | POA: Diagnosis not present

## 2022-05-27 DIAGNOSIS — D62 Acute posthemorrhagic anemia: Secondary | ICD-10-CM | POA: Diagnosis present

## 2022-05-27 DIAGNOSIS — K8020 Calculus of gallbladder without cholecystitis without obstruction: Secondary | ICD-10-CM | POA: Diagnosis not present

## 2022-05-27 DIAGNOSIS — R0602 Shortness of breath: Secondary | ICD-10-CM

## 2022-05-27 DIAGNOSIS — G893 Neoplasm related pain (acute) (chronic): Secondary | ICD-10-CM | POA: Diagnosis present

## 2022-05-27 DIAGNOSIS — J918 Pleural effusion in other conditions classified elsewhere: Secondary | ICD-10-CM | POA: Diagnosis present

## 2022-05-27 DIAGNOSIS — C50911 Malignant neoplasm of unspecified site of right female breast: Secondary | ICD-10-CM | POA: Diagnosis present

## 2022-05-27 DIAGNOSIS — Z803 Family history of malignant neoplasm of breast: Secondary | ICD-10-CM | POA: Diagnosis not present

## 2022-05-27 DIAGNOSIS — K254 Chronic or unspecified gastric ulcer with hemorrhage: Principal | ICD-10-CM | POA: Diagnosis present

## 2022-05-27 DIAGNOSIS — C7951 Secondary malignant neoplasm of bone: Secondary | ICD-10-CM | POA: Diagnosis present

## 2022-05-27 DIAGNOSIS — Z8249 Family history of ischemic heart disease and other diseases of the circulatory system: Secondary | ICD-10-CM | POA: Diagnosis not present

## 2022-05-27 DIAGNOSIS — E8809 Other disorders of plasma-protein metabolism, not elsewhere classified: Secondary | ICD-10-CM | POA: Diagnosis present

## 2022-05-27 DIAGNOSIS — K922 Gastrointestinal hemorrhage, unspecified: Secondary | ICD-10-CM | POA: Diagnosis not present

## 2022-05-27 DIAGNOSIS — K259 Gastric ulcer, unspecified as acute or chronic, without hemorrhage or perforation: Secondary | ICD-10-CM | POA: Diagnosis not present

## 2022-05-27 DIAGNOSIS — Z6839 Body mass index (BMI) 39.0-39.9, adult: Secondary | ICD-10-CM | POA: Diagnosis not present

## 2022-05-27 DIAGNOSIS — Z808 Family history of malignant neoplasm of other organs or systems: Secondary | ICD-10-CM | POA: Diagnosis not present

## 2022-05-27 DIAGNOSIS — E669 Obesity, unspecified: Secondary | ICD-10-CM | POA: Diagnosis not present

## 2022-05-27 DIAGNOSIS — K449 Diaphragmatic hernia without obstruction or gangrene: Secondary | ICD-10-CM | POA: Diagnosis present

## 2022-05-27 DIAGNOSIS — K920 Hematemesis: Secondary | ICD-10-CM | POA: Diagnosis not present

## 2022-05-27 DIAGNOSIS — Z1152 Encounter for screening for COVID-19: Secondary | ICD-10-CM | POA: Diagnosis not present

## 2022-05-27 DIAGNOSIS — J90 Pleural effusion, not elsewhere classified: Secondary | ICD-10-CM | POA: Diagnosis not present

## 2022-05-27 DIAGNOSIS — E440 Moderate protein-calorie malnutrition: Secondary | ICD-10-CM | POA: Diagnosis not present

## 2022-05-27 DIAGNOSIS — Z853 Personal history of malignant neoplasm of breast: Secondary | ICD-10-CM | POA: Diagnosis not present

## 2022-05-27 DIAGNOSIS — D649 Anemia, unspecified: Secondary | ICD-10-CM | POA: Diagnosis not present

## 2022-05-27 DIAGNOSIS — J9610 Chronic respiratory failure, unspecified whether with hypoxia or hypercapnia: Secondary | ICD-10-CM | POA: Diagnosis not present

## 2022-05-27 DIAGNOSIS — R1111 Vomiting without nausea: Secondary | ICD-10-CM | POA: Diagnosis not present

## 2022-05-27 DIAGNOSIS — E66812 Obesity, class 2: Secondary | ICD-10-CM | POA: Diagnosis present

## 2022-05-27 DIAGNOSIS — R110 Nausea: Secondary | ICD-10-CM | POA: Diagnosis not present

## 2022-05-27 HISTORY — PX: ESOPHAGOGASTRODUODENOSCOPY (EGD) WITH PROPOFOL: SHX5813

## 2022-05-27 LAB — URINALYSIS, ROUTINE W REFLEX MICROSCOPIC
Bacteria, UA: NONE SEEN
Bilirubin Urine: NEGATIVE
Glucose, UA: NEGATIVE mg/dL
Hgb urine dipstick: NEGATIVE
Ketones, ur: NEGATIVE mg/dL
Nitrite: NEGATIVE
Protein, ur: NEGATIVE mg/dL
Specific Gravity, Urine: >1.046 — ABNORMAL HIGH (ref 1.005–1.030)
pH: 7 (ref 5.0–8.0)

## 2022-05-27 LAB — CBC WITH DIFFERENTIAL/PLATELET
Abs Immature Granulocytes: 0.04 10*3/uL (ref 0.00–0.07)
Basophils Absolute: 0.1 10*3/uL (ref 0.0–0.1)
Basophils Relative: 1 %
Eosinophils Absolute: 0.3 10*3/uL (ref 0.0–0.5)
Eosinophils Relative: 3 %
HCT: 23.2 % — ABNORMAL LOW (ref 36.0–46.0)
Hemoglobin: 6.3 g/dL — CL (ref 12.0–15.0)
Immature Granulocytes: 1 %
Lymphocytes Relative: 11 %
Lymphs Abs: 0.9 10*3/uL (ref 0.7–4.0)
MCH: 25.6 pg — ABNORMAL LOW (ref 26.0–34.0)
MCHC: 27.2 g/dL — ABNORMAL LOW (ref 30.0–36.0)
MCV: 94.3 fL (ref 80.0–100.0)
Monocytes Absolute: 0.4 10*3/uL (ref 0.1–1.0)
Monocytes Relative: 5 %
Neutro Abs: 6.6 10*3/uL (ref 1.7–7.7)
Neutrophils Relative %: 79 %
Platelets: 310 10*3/uL (ref 150–400)
RBC: 2.46 MIL/uL — ABNORMAL LOW (ref 3.87–5.11)
RDW: 19.4 % — ABNORMAL HIGH (ref 11.5–15.5)
WBC: 8.3 10*3/uL (ref 4.0–10.5)
nRBC: 0.5 % — ABNORMAL HIGH (ref 0.0–0.2)

## 2022-05-27 LAB — POC OCCULT BLOOD, ED: Fecal Occult Bld: NEGATIVE

## 2022-05-27 LAB — COMPREHENSIVE METABOLIC PANEL
ALT: 17 U/L (ref 0–44)
AST: 25 U/L (ref 15–41)
Albumin: 2.8 g/dL — ABNORMAL LOW (ref 3.5–5.0)
Alkaline Phosphatase: 84 U/L (ref 38–126)
Anion gap: 7 (ref 5–15)
BUN: 25 mg/dL — ABNORMAL HIGH (ref 8–23)
CO2: 28 mmol/L (ref 22–32)
Calcium: 8.4 mg/dL — ABNORMAL LOW (ref 8.9–10.3)
Chloride: 104 mmol/L (ref 98–111)
Creatinine, Ser: 0.87 mg/dL (ref 0.44–1.00)
GFR, Estimated: >60 mL/min (ref >60)
Glucose, Bld: 125 mg/dL — ABNORMAL HIGH (ref 70–99)
Potassium: 4.3 mmol/L (ref 3.5–5.1)
Sodium: 139 mmol/L (ref 135–145)
Total Bilirubin: 0.6 mg/dL (ref 0.3–1.2)
Total Protein: 6 g/dL — ABNORMAL LOW (ref 6.5–8.1)

## 2022-05-27 LAB — HEMOGLOBIN AND HEMATOCRIT, BLOOD
HCT: 27.3 % — ABNORMAL LOW (ref 36.0–46.0)
HCT: 24.3 % — ABNORMAL LOW (ref 36.0–46.0)
Hemoglobin: 7.9 g/dL — ABNORMAL LOW (ref 12.0–15.0)
Hemoglobin: 7 g/dL — ABNORMAL LOW (ref 12.0–15.0)

## 2022-05-27 LAB — LIPASE, BLOOD: Lipase: 31 U/L (ref 11–51)

## 2022-05-27 LAB — RESP PANEL BY RT-PCR (RSV, FLU A&B, COVID)  RVPGX2
Influenza A by PCR: NEGATIVE
Influenza B by PCR: NEGATIVE
Resp Syncytial Virus by PCR: NEGATIVE
SARS Coronavirus 2 by RT PCR: NEGATIVE

## 2022-05-27 LAB — LACTIC ACID, PLASMA
Lactic Acid, Venous: 1.9 mmol/L (ref 0.5–1.9)
Lactic Acid, Venous: 1.1 mmol/L (ref 0.5–1.9)

## 2022-05-27 LAB — TROPONIN I (HIGH SENSITIVITY)
Troponin I (High Sensitivity): 3 ng/L (ref <18)
Troponin I (High Sensitivity): 3 ng/L (ref <18)

## 2022-05-27 LAB — MAGNESIUM: Magnesium: 1.9 mg/dL (ref 1.7–2.4)

## 2022-05-27 LAB — BRAIN NATRIURETIC PEPTIDE: B Natriuretic Peptide: 13.7 pg/mL (ref 0.0–100.0)

## 2022-05-27 LAB — PREPARE RBC (CROSSMATCH)

## 2022-05-27 LAB — PHOSPHORUS: Phosphorus: 2.9 mg/dL (ref 2.5–4.6)

## 2022-05-27 SURGERY — ESOPHAGOGASTRODUODENOSCOPY (EGD) WITH PROPOFOL
Anesthesia: Monitor Anesthesia Care

## 2022-05-27 MED ORDER — ORAL CARE MOUTH RINSE: 15.0000 mL | OROMUCOSAL | Status: DC | PRN

## 2022-05-27 MED ORDER — SODIUM CHLORIDE 0.9 % IV SOLN: INTRAVENOUS | Status: DC

## 2022-05-27 MED ORDER — PANTOPRAZOLE SODIUM 40 MG IV SOLR
40.0000 mg | Freq: Once | INTRAVENOUS | Status: AC
  Administered 2022-05-27: 40 mg via INTRAVENOUS
  Filled 2022-05-27: qty 10

## 2022-05-27 MED ORDER — ACETAMINOPHEN 325 MG PO TABS: 650.0000 mg | ORAL_TABLET | Freq: Four times a day (QID) | ORAL | Status: DC | PRN

## 2022-05-27 MED ORDER — SERTRALINE HCL 50 MG PO TABS
75.0000 mg | ORAL_TABLET | Freq: Every day | ORAL | Status: DC
  Administered 2022-05-27 – 2022-05-29 (×3): 75 mg via ORAL
  Filled 2022-05-27 (×4): qty 1

## 2022-05-27 MED ORDER — PROPOFOL 10 MG/ML IV BOLUS
INTRAVENOUS | Status: DC | PRN
  Administered 2022-05-27 (×2): 30 mg via INTRAVENOUS
  Administered 2022-05-27: 50 mg via INTRAVENOUS

## 2022-05-27 MED ORDER — INFLUENZA VAC SPLIT QUAD 0.5 ML IM SUSY
0.5000 mL | PREFILLED_SYRINGE | INTRAMUSCULAR | Status: AC
  Administered 2022-05-30: 0.5 mL via INTRAMUSCULAR
  Filled 2022-05-27: qty 0.5

## 2022-05-27 MED ORDER — LACTATED RINGERS IV SOLN: INTRAVENOUS | Status: DC | PRN

## 2022-05-27 MED ORDER — PROPOFOL 10 MG/ML IV BOLUS
INTRAVENOUS | Status: AC
  Filled 2022-05-27: qty 20

## 2022-05-27 MED ORDER — IPRATROPIUM-ALBUTEROL 0.5-2.5 (3) MG/3ML IN SOLN: 3.0000 mL | Freq: Four times a day (QID) | RESPIRATORY_TRACT | Status: DC | PRN

## 2022-05-27 MED ORDER — IOHEXOL 350 MG/ML SOLN
100.0000 mL | Freq: Once | INTRAVENOUS | Status: AC | PRN
  Administered 2022-05-27: 100 mL via INTRAVENOUS

## 2022-05-27 MED ORDER — LIDOCAINE 2% (20 MG/ML) 5 ML SYRINGE
INTRAMUSCULAR | Status: DC | PRN
  Administered 2022-05-27: 80 mg via INTRAVENOUS

## 2022-05-27 MED ORDER — ONDANSETRON HCL 4 MG PO TABS: 4.0000 mg | ORAL_TABLET | Freq: Four times a day (QID) | ORAL | Status: DC | PRN

## 2022-05-27 MED ORDER — SODIUM CHLORIDE 0.9% IV SOLUTION: Freq: Once | INTRAVENOUS | Status: AC

## 2022-05-27 MED ORDER — PANTOPRAZOLE SODIUM 40 MG IV SOLR: 40.0000 mg | Freq: Two times a day (BID) | INTRAVENOUS | Status: DC

## 2022-05-27 MED ORDER — PANTOPRAZOLE INFUSION (NEW) - SIMPLE MED
8.0000 mg/h | INTRAVENOUS | Status: DC
  Administered 2022-05-27 – 2022-05-28 (×4): 8 mg/h via INTRAVENOUS
  Filled 2022-05-27 (×3): qty 80
  Filled 2022-05-27: qty 100
  Filled 2022-05-27: qty 80
  Filled 2022-05-27: qty 100

## 2022-05-27 MED ORDER — ACETAMINOPHEN 650 MG RE SUPP: 650.0000 mg | Freq: Four times a day (QID) | RECTAL | Status: DC | PRN

## 2022-05-27 MED ORDER — ONDANSETRON HCL 4 MG/2ML IJ SOLN: 4.0000 mg | Freq: Four times a day (QID) | INTRAMUSCULAR | Status: DC | PRN

## 2022-05-27 SURGICAL SUPPLY — 15 items

## 2022-05-27 NOTE — ED Triage Notes (Signed)
Patient BIB [facility] from home. Stage 4 breast cancer patient receiving oral chemo. Vomited x2 today coffee ground emesis. Pain in the middle of her stomach above her belly button.

## 2022-05-27 NOTE — ED Notes (Signed)
Patient given ice chips and clear liquid frozen icey.

## 2022-05-27 NOTE — H&P (View-Only) (Signed)
[facility] Consultation Note  Referring Provider: [facility] Primary Care Physician:  [name], NP Primary Gastroenterologist:  None  Reason for Consultation:  melena, hematemesis  HPI: Suzanne [name] is a 61 y.o. female presenting dark/slight red emesis and some dark stools.  Started within past 24 hours.  No abdominal pain or blood thinners.  Takes NSAIDs on occasion.  On famotidine but not PPI as outpatient.  History metastatic (to bone and liver) breast cancer.  No prior GI bleed.  Denies prior EGD.   Past Medical History:  Diagnosis Date   Cancer [facility])    Family history of breast cancer    Family history of colon cancer    Family history of melanoma    Family history of prostate cancer    Gallstones    Glaucoma    Mastitis    reports history of recurrent mastitis   Metastatic breast cancer    Obesity     Past Surgical History:  Procedure Laterality Date   GLAUCOMA SURGERY  late 1990's   IR THORACENTESIS ASP PLEURAL SPACE W/IMG GUIDE  10/28/2018   IR THORACENTESIS ASP PLEURAL SPACE W/IMG GUIDE  10/29/2018   OPEN REDUCTION INTERNAL FIXATION (ORIF) DISTAL RADIAL FRACTURE Left 01/09/2019   Procedure: OPEN REDUCTION INTERNAL FIXATION (ORIF) HUMERAL FRACTURE;  Surgeon: Netta Cedars, MD;  Location: [facility];  Service: Orthopedics;  Laterality: Left;    Prior to Admission medications   Medication Sig Start Date End Date Taking? Authorizing Provider  acetaminophen (TYLENOL) 500 MG tablet Take 1,000 mg by mouth daily as needed for mild pain.    [provider]  Calcium Carbonate Antacid (TUMS PO) Take 3 tablets by mouth as needed ([place]).    [provider]  cetirizine (ZYRTEC) 10 MG tablet Take 10 mg by mouth daily.    [provider]  Denosumab (XGEVA [place]) Inject 1 Dose into the skin every 30 (thirty) days.    [provider]  elacestrant hydrochloride (ORSERDU) 345 MG tablet Take 1 tablet (345 mg total) by mouth  daily. Take with food. 04/25/22   Benay Pike, MD  famotidine (PEPCID) 20 MG tablet Take 1 tablet (20 mg total) by mouth 2 (two) times daily as needed for heartburn or indigestion. Patient taking differently: Take 20 mg by mouth as needed for heartburn or indigestion. 07/28/20   [name], Virgie Dad, MD  fluconazole (DIFLUCAN) 200 MG tablet Take 1 tablet (200 mg total) by mouth daily. 04/06/22   Gardenia Phlegm, NP  folic acid (FOLVITE) 1 MG tablet Take 1 tablet (1 mg total) by mouth daily. 09/29/21   Benay Pike, MD  furosemide (LASIX) 20 MG tablet Take 1 tablet (20 mg total) by mouth daily. 09/29/21   Benay Pike, MD  Ibuprofen-Acetaminophen (ADVIL DUAL ACTION) 125-250 MG TABS Take 2 tablets by mouth daily as needed (pain).    [provider]  ipratropium-albuterol (DUONEB) 0.5-2.5 (3) MG/3ML SOLN Take 3 mLs by nebulization every 6 (six) hours as needed. 05/03/22   Iruku, Arletha Pili, MD  MELATONIN TR 1 MG TBCR TAKE ONE TABLET BY MOUTH EVERYDAY AT BEDTIME Patient taking differently: Take 1 mg by mouth at bedtime. 04/03/22   Benay Pike, MD  ondansetron (ZOFRAN) 8 MG tablet Take 1 tablet (8 mg total) by mouth every 8 (eight) hours as needed for nausea or vomiting. Patient taking differently: Take 8 mg by mouth as needed for nausea or vomiting. 02/26/22   Benay Pike, MD  OYSTER SHELL CALCIUM PLUS  D 500-5 MG-MCG TABS TAKE ONE TABLET BY MOUTH EVERY MORNING and TAKE ONE TABLET BY MOUTH AT NOON and TAKE ONE TABLET BY MOUTH EVERY EVENING Patient taking differently: Take 1 tablet by mouth 2 (two) times daily. 04/27/21   [name], Virgie Dad, MD  polyethylene glycol (MIRALAX / GLYCOLAX) 17 g packet Take 17 g by mouth as needed. Patient taking differently: Take 17 g by mouth daily as needed for mild constipation. 07/28/20   [name], Virgie Dad, MD  sertraline (ZOLOFT) 50 MG tablet TAKE 1 AND 1/2 TABLETS BY MOUTH EVERYDAY AT BEDTIME Patient taking differently: Take 75 mg by mouth at  bedtime. When able to remember 12/25/21   Charlott Rakes, MD  triamcinolone ointment (KENALOG) 0.5 % Apply 1 Application topically 2 (two) times daily. Patient taking differently: Apply 1 Application topically as needed (rash). 04/02/22   Benay Pike, MD    Current Facility-Administered Medications  Medication Dose Route Frequency Provider Last Rate Last Admin   0.9 %  sodium chloride infusion   Intravenous Continuous Reubin [name], MD       acetaminophen (TYLENOL) tablet 650 mg  650 mg Oral Q6H PRN Reubin [name], MD       Or   acetaminophen (TYLENOL) suppository 650 mg  650 mg Rectal Q6H PRN Reubin [name], MD       ondansetron [facility]) tablet 4 mg  4 mg Oral Q6H PRN Reubin [name], MD       Or   ondansetron [facility]) injection 4 mg  4 mg Intravenous Q6H PRN Reubin [name], MD       [START ON 05/30/2022] pantoprazole (PROTONIX) injection 40 mg  40 mg Intravenous Q12H Reubin [name], MD       pantoprazole (PROTONIX) injection 40 mg  40 mg Intravenous Once Reubin [name], MD       pantoprozole (PROTONIX) 80 mg /NS 100 mL infusion  8 mg/hr Intravenous Continuous Reubin [name], MD       Current Outpatient Medications  Medication Sig Dispense Refill   acetaminophen (TYLENOL) 500 MG tablet Take 1,000 mg by mouth daily as needed for mild pain.     Calcium Carbonate Antacid (TUMS PO) Take 3 tablets by mouth as needed ([place]).     cetirizine (ZYRTEC) 10 MG tablet Take 10 mg by mouth daily.     Denosumab (XGEVA [place]) Inject 1 Dose into the skin every 30 (thirty) days.     elacestrant hydrochloride (ORSERDU) 345 MG tablet Take 1 tablet (345 mg total) by mouth daily. Take with food. 30 tablet 3   famotidine (PEPCID) 20 MG tablet Take 1 tablet (20 mg total) by mouth 2 (two) times daily as needed for heartburn or indigestion. (Patient taking differently: Take 20 mg by mouth as needed for heartburn or indigestion.) 30 tablet 3   fluconazole (DIFLUCAN) 200 MG  tablet Take 1 tablet (200 mg total) by mouth daily. 30 tablet 0   folic acid (FOLVITE) 1 MG tablet Take 1 tablet (1 mg total) by mouth daily. 90 tablet 4   furosemide (LASIX) 20 MG tablet Take 1 tablet (20 mg total) by mouth daily. 90 tablet 4   Ibuprofen-Acetaminophen (ADVIL DUAL ACTION) 125-250 MG TABS Take 2 tablets by mouth daily as needed (pain).     ipratropium-albuterol (DUONEB) 0.5-2.5 (3) MG/3ML SOLN Take 3 mLs by nebulization every 6 (six) hours as needed. 360 mL 0   MELATONIN TR 1 MG TBCR TAKE ONE TABLET BY MOUTH EVERYDAY  AT BEDTIME (Patient taking differently: Take 1 mg by mouth at bedtime.) 90 tablet 4   ondansetron (ZOFRAN) 8 MG tablet Take 1 tablet (8 mg total) by mouth every 8 (eight) hours as needed for nausea or vomiting. (Patient taking differently: Take 8 mg by mouth as needed for nausea or vomiting.) 20 tablet 1   OYSTER SHELL CALCIUM PLUS D 500-5 MG-MCG TABS TAKE ONE TABLET BY MOUTH EVERY MORNING and TAKE ONE TABLET BY MOUTH AT NOON and TAKE ONE TABLET BY MOUTH EVERY EVENING (Patient taking differently: Take 1 tablet by mouth 2 (two) times daily.) 90 tablet 1   polyethylene glycol (MIRALAX / GLYCOLAX) 17 g packet Take 17 g by mouth as needed. (Patient taking differently: Take 17 g by mouth daily as needed for mild constipation.) 14 each 6   sertraline (ZOLOFT) 50 MG tablet TAKE 1 AND 1/2 TABLETS BY MOUTH EVERYDAY AT BEDTIME (Patient taking differently: Take 75 mg by mouth at bedtime. When able to remember) 45 tablet 0   triamcinolone ointment (KENALOG) 0.5 % Apply 1 Application topically 2 (two) times daily. (Patient taking differently: Apply 1 Application topically as needed (rash).) 30 g 0    Allergies as of 05/27/2022 - Review Complete 05/27/2022  Allergen Reaction Noted   Ferumoxytol Other (See Comments) 07/14/2021    Family History  Problem Relation Age of Onset   Breast cancer Sister        in her 13's-70s   Heart disease Brother        CABG   Heart disease Brother         CABG   Skin cancer Brother        melanoma   Colon cancer Cousin    Heart attack Mother    Heart attack Father    Prostate cancer Brother     Social History   Socioeconomic History   Marital status: Widowed    Spouse name: Not on file   Number of children: 1   Years of education: Not on file   Highest education level: Not on file  Occupational History   Occupation: Clinical research associate Programs    Comment: Self-employed  Tobacco Use   Smoking status: Never   Smokeless tobacco: Never   Tobacco comments:    second hand smoke exposure  Vaping Use   Vaping Use: Never used  Substance and Sexual Activity   Alcohol use: No   Drug use: No   Sexual activity: Not Currently  Other Topics Concern   Not on file  Social History Narrative   Has a daughter named [name]. Daughter has CP.   Social Determinants of Health   Financial Resource Strain: Low Risk  (08/16/2021)   Overall Financial Resource Strain (CARDIA)    Difficulty of Paying Living Expenses: Not hard at all  Food Insecurity: No Food Insecurity (04/23/2022)   Hunger Vital Sign    Worried About Running Out of Food in the Last Year: Never true    Ran Out of Food in the Last Year: Never true  Transportation Needs: No Transportation Needs (04/23/2022)   PRAPARE - Hydrologist (Medical): No    Lack of Transportation (Non-Medical): No  Physical Activity: Not on file  Stress: Stress Concern Present (11/08/2021)   [place]    Feeling of Stress : To some extent  Social Connections: Socially Isolated (03/27/2021)   Social Connection and Isolation Panel [NHANES]  Frequency of Communication with Friends and Family: Once a week    Frequency of Social Gatherings with Friends and Family: More than three times a week    Attends Religious Services: Never    Marine scientist or Organizations: No    Attends Theatre manager Meetings: Never    Marital Status: Widowed  Intimate Partner Violence: Not At Risk (12/24/2018)   Humiliation, Afraid, Rape, and Kick questionnaire    Fear of Current or Ex-Partner: No    Emotionally Abused: No    Physically Abused: No    Sexually Abused: No    Review of Systems: As per HPI, all others negative  Physical Exam: Vital signs in last 24 hours: Temp:  [96.5 F (35.8 C)-98 F (36.7 C)] 97.6 F (36.4 C) (12/31 1103) Pulse Rate:  [100-118] 102 (12/31 1130) Resp:  [18-24] 22 (12/31 1130) BP: (99-124)/(60-74) 107/74 (12/31 1130) SpO2:  [95 %-100 %] 100 % (12/31 1130)   General:   Alert,  overweight, chronically ill-appearing, older-appearing than stated age Head:  Normocephalic and atraumatic. Eyes:  Sclera clear, no icterus.   Conjunctiva pale Ears:  Normal auditory acuity. Nose:  No deformity, discharge,  or lesions. Mouth:  No deformity or lesions.  Oropharynx pale and dry Neck:  Supple; no masses or thyromegaly. Lungs:  Deconditioned, weak, mild tachypnea Heart:  Tachycardic (low 100-110) Abdomen:  Soft, protuberant, nontender and nondistended. No masses, hepatosplenomegaly or hernias noted. Normal bowel sounds, without guarding, and without rebound.     Msk:  Symmetrical without gross deformities. Normal posture. Pulses:  Normal pulses noted. Extremities:  Without clubbing or edema. Neurologic:  Alert and  oriented x4; diffusely weak-appearing, otherwise grossly normal neurologically. Skin:  Intact without significant lesions or rashes. Cervical Nodes:  No significant cervical adenopathy. Psych:  Alert and cooperative. Normal mood and affect.   Lab Results: Recent Labs    05/27/22 0750  WBC 8.3  HGB 6.3*  HCT 23.2*  PLT 310   BMET Recent Labs    05/27/22 0750  NA 139  K 4.3  CL 104  CO2 28  GLUCOSE 125*  BUN 25*  CREATININE 0.87  CALCIUM 8.4*   LFT Recent Labs    05/27/22 0750  PROT 6.0*  ALBUMIN 2.8*  AST 25  ALT 17   ALKPHOS 84  BILITOT 0.6   PT/INR No results for input(s): "LABPROT", "INR" in the last 72 hours.  Studies/Results: CT ANGIO GI BLEED  Result Date: 05/27/2022 CLINICAL DATA:  Diffuse abdominal pain with dark stools and coffee-ground emesis. Symptomatic anemia. History of metastatic breast cancer EXAM: CTA ABDOMEN AND PELVIS WITHOUT AND WITH CONTRAST TECHNIQUE: Multidetector CT imaging of the abdomen and pelvis was performed using the standard protocol during bolus administration of intravenous contrast. Multiplanar reconstructed images and MIPs were obtained and reviewed to evaluate the vascular anatomy. RADIATION DOSE REDUCTION: This exam was performed according to the departmental dose-optimization program which includes automated exposure control, adjustment of the mA and/or kV according to patient size and/or use of iterative reconstruction technique. CONTRAST:  165m OMNIPAQUE IOHEXOL 350 MG/ML SOLN COMPARISON:  CT 03/05/2022 FINDINGS: VASCULAR Aorta: Normal caliber aorta without aneurysm, dissection, vasculitis or significant stenosis. Scattered atherosclerotic calcification. Celiac: Patent without evidence of aneurysm, dissection, vasculitis or significant stenosis. SMA: Patent without evidence of aneurysm, dissection, vasculitis or significant stenosis. Renals: Both renal arteries are patent without evidence of aneurysm, dissection, vasculitis, fibromuscular dysplasia or significant stenosis. IMA: Patent. Inflow: Patent without evidence of aneurysm,  dissection, vasculitis or significant stenosis. Proximal Outflow: Bilateral common femoral and visualized portions of the superficial and profunda femoral arteries are patent without evidence of aneurysm, dissection, vasculitis or significant stenosis. Veins: Major venous structures including the portal vein and IVC are patent. Review of the MIP images confirms the above findings. NON-VASCULAR Lower chest: Chronic elevation of the right hemidiaphragm  with right basilar atelectasis. Mosaic attenuation of the lung fields. Subtle nodularity along the pleural surfaces of the lung bases is suspicious for metastatic disease. No pleural effusions. Hepatobiliary: Diffuse hepatic metastatic disease. Several of the hepatic masses appear increased in size compared to 03/05/2022. Multiple stones within the gallbladder. No pericholecystic inflammatory changes by CT. Pancreas: Unremarkable. No pancreatic ductal dilatation or surrounding inflammatory changes. Spleen: Normal in size without focal abnormality. Adrenals/Urinary Tract: Unremarkable adrenal glands. Limited assessment for renal calculi given the presence of excreted contrast in the renal collecting systems on pre contrast imaging. No renal lesion or hydronephrosis. Stomach/Bowel: Moderate-large hiatal hernia. No dilated loops of bowel. Normal appendix in the right lower quadrant. Mild scattered diverticulosis. No focal bowel wall thickening or inflammatory changes. No abnormal intraluminal contrast accumulation on multiphasic images to suggest active GI bleed by CT. Lymphatic: No abdominopelvic lymphadenopathy. Reproductive: Uterus and bilateral adnexa are unremarkable. Other: No free fluid. No abdominopelvic fluid collection. No pneumoperitoneum. No abdominal wall hernia. Musculoskeletal: Widespread mixed lytic and sclerotic lesions throughout the skeleton. Unchanged focal kyphosis at the thoracolumbar junction where there are associated chronic compression deformities. Multiple healed bilateral rib fractures. No new pathologic fractures are identified. IMPRESSION: 1. No evidence of active GI bleed by CT. 2. No acute abdominopelvic findings. 3. Diffuse hepatic metastatic disease. Several of the hepatic masses appear increased in size compared to 03/05/2022. 4. Subtle nodularity along the pleural surfaces of the lung bases is suspicious for metastatic disease. 5. Widespread osseous metastatic disease. No new  pathologic fractures are identified. 6. Moderate-large hiatal hernia. 7. Cholelithiasis. Electronically Signed   By: Davina Poke D.O.   On: 05/27/2022 10:26   DG Chest Portable 1 View  Result Date: 05/27/2022 CLINICAL DATA:  61 year old female with history of exertional shortness of breath and chest pressure. EXAM: PORTABLE CHEST 1 VIEW COMPARISON:  Chest x-ray 04/17/2022. FINDINGS: Chronic severe elevation of the right hemidiaphragm is again noted with associated areas of passive subsegmental atelectasis and/or scarring in the right lung base. Left lung is clear. No pleural effusions. No pneumothorax. No evidence of pulmonary edema. Heart size is normal. Upper mediastinal contours are within normal limits. Atherosclerotic calcifications in the thoracic aorta. Large hiatal hernia. IMPRESSION: 1. Radiographic appearance the chest is unchanged, with no definitive evidence to suggest acute cardiopulmonary disease. 2. Aortic atherosclerosis. 3. Large hiatal hernia. Electronically Signed   By: Vinnie Langton M.D.   On: 05/27/2022 08:28    Impression:   Coffee ground and red emesis, with melena.   Metastatic breast cancer (to liver, to bones) Anemia, at least partial acute blood loss component.  Plan:   PPI, volume repletion, blood transfuse. Follow CBCs. Keep NPO; will try to add on for EGD today. Risks (bleeding, infection, bowel perforation that could require surgery, sedation-related changes in cardiopulmonary systems), benefits (identification and possible treatment of source of symptoms, exclusion of certain causes of symptoms), and alternatives (watchful waiting, radiographic imaging studies, empiric medical treatment) of upper endoscopy (EGD) were explained to patient/family in detail and patient wishes to proceed.  Next step in management pending endoscopy findings.   LOS: 0 days  Landry Dyke  05/27/2022, 12:19 PM  Cell [phone] If no answer or after 5 PM call  [phone]

## 2022-05-27 NOTE — Consult Note (Signed)
[facility] Consultation Note  Referring Provider: [facility] Primary Care Physician:  [name], NP Primary Gastroenterologist:  None  Reason for Consultation:  melena, hematemesis  HPI: Suzanne [name] is a 61 y.o. female presenting dark/slight red emesis and some dark stools.  Started within past 24 hours.  No abdominal pain or blood thinners.  Takes NSAIDs on occasion.  On famotidine but not PPI as outpatient.  History metastatic (to bone and liver) breast cancer.  No prior GI bleed.  Denies prior EGD.   Past Medical History:  Diagnosis Date   Cancer [facility])    Family history of breast cancer    Family history of colon cancer    Family history of melanoma    Family history of prostate cancer    Gallstones    Glaucoma    Mastitis    reports history of recurrent mastitis   Metastatic breast cancer    Obesity     Past Surgical History:  Procedure Laterality Date   GLAUCOMA SURGERY  late 1990's   IR THORACENTESIS ASP PLEURAL SPACE W/IMG GUIDE  10/28/2018   IR THORACENTESIS ASP PLEURAL SPACE W/IMG GUIDE  10/29/2018   OPEN REDUCTION INTERNAL FIXATION (ORIF) DISTAL RADIAL FRACTURE Left 01/09/2019   Procedure: OPEN REDUCTION INTERNAL FIXATION (ORIF) HUMERAL FRACTURE;  Surgeon: Netta Cedars, MD;  Location: [facility];  Service: Orthopedics;  Laterality: Left;    Prior to Admission medications   Medication Sig Start Date End Date Taking? Authorizing Provider  acetaminophen (TYLENOL) 500 MG tablet Take 1,000 mg by mouth daily as needed for mild pain.    [provider]  Calcium Carbonate Antacid (TUMS PO) Take 3 tablets by mouth as needed (hearburn).    [provider]  cetirizine (ZYRTEC) 10 MG tablet Take 10 mg by mouth daily.    [provider]  Denosumab (XGEVA [place]) Inject 1 Dose into the skin every 30 (thirty) days.    [provider]  elacestrant hydrochloride (ORSERDU) 345 MG tablet Take 1 tablet (345 mg total) by mouth  daily. Take with food. 04/25/22   Benay Pike, MD  famotidine (PEPCID) 20 MG tablet Take 1 tablet (20 mg total) by mouth 2 (two) times daily as needed for heartburn or indigestion. Patient taking differently: Take 20 mg by mouth as needed for heartburn or indigestion. 07/28/20   [name], Virgie Dad, MD  fluconazole (DIFLUCAN) 200 MG tablet Take 1 tablet (200 mg total) by mouth daily. 04/06/22   Gardenia Phlegm, NP  folic acid (FOLVITE) 1 MG tablet Take 1 tablet (1 mg total) by mouth daily. 09/29/21   Benay Pike, MD  furosemide (LASIX) 20 MG tablet Take 1 tablet (20 mg total) by mouth daily. 09/29/21   Benay Pike, MD  Ibuprofen-Acetaminophen (ADVIL DUAL ACTION) 125-250 MG TABS Take 2 tablets by mouth daily as needed (pain).    [provider]  ipratropium-albuterol (DUONEB) 0.5-2.5 (3) MG/3ML SOLN Take 3 mLs by nebulization every 6 (six) hours as needed. 05/03/22   Iruku, Arletha Pili, MD  MELATONIN TR 1 MG TBCR TAKE ONE TABLET BY MOUTH EVERYDAY AT BEDTIME Patient taking differently: Take 1 mg by mouth at bedtime. 04/03/22   Benay Pike, MD  ondansetron (ZOFRAN) 8 MG tablet Take 1 tablet (8 mg total) by mouth every 8 (eight) hours as needed for nausea or vomiting. Patient taking differently: Take 8 mg by mouth as needed for nausea or vomiting. 02/26/22   Benay Pike, MD  OYSTER SHELL CALCIUM PLUS  D 500-5 MG-MCG TABS TAKE ONE TABLET BY MOUTH EVERY MORNING and TAKE ONE TABLET BY MOUTH AT NOON and TAKE ONE TABLET BY MOUTH EVERY EVENING Patient taking differently: Take 1 tablet by mouth 2 (two) times daily. 04/27/21   [name], Virgie Dad, MD  polyethylene glycol (MIRALAX / GLYCOLAX) 17 g packet Take 17 g by mouth as needed. Patient taking differently: Take 17 g by mouth daily as needed for mild constipation. 07/28/20   [name], Virgie Dad, MD  sertraline (ZOLOFT) 50 MG tablet TAKE 1 AND 1/2 TABLETS BY MOUTH EVERYDAY AT BEDTIME Patient taking differently: Take 75 mg by mouth at  bedtime. When able to remember 12/25/21   Charlott Rakes, MD  triamcinolone ointment (KENALOG) 0.5 % Apply 1 Application topically 2 (two) times daily. Patient taking differently: Apply 1 Application topically as needed (rash). 04/02/22   Benay Pike, MD    Current Facility-Administered Medications  Medication Dose Route Frequency Provider Last Rate Last Admin   0.9 %  sodium chloride infusion   Intravenous Continuous Reubin [name], MD       acetaminophen (TYLENOL) tablet 650 mg  650 mg Oral Q6H PRN Reubin [name], MD       Or   acetaminophen (TYLENOL) suppository 650 mg  650 mg Rectal Q6H PRN Reubin [name], MD       ondansetron [facility]) tablet 4 mg  4 mg Oral Q6H PRN Reubin [name], MD       Or   ondansetron [facility]) injection 4 mg  4 mg Intravenous Q6H PRN Reubin [name], MD       [START ON 05/30/2022] pantoprazole (PROTONIX) injection 40 mg  40 mg Intravenous Q12H Reubin [name], MD       pantoprazole (PROTONIX) injection 40 mg  40 mg Intravenous Once Reubin [name], MD       pantoprozole (PROTONIX) 80 mg /NS 100 mL infusion  8 mg/hr Intravenous Continuous Reubin [name], MD       Current Outpatient Medications  Medication Sig Dispense Refill   acetaminophen (TYLENOL) 500 MG tablet Take 1,000 mg by mouth daily as needed for mild pain.     Calcium Carbonate Antacid (TUMS PO) Take 3 tablets by mouth as needed (hearburn).     cetirizine (ZYRTEC) 10 MG tablet Take 10 mg by mouth daily.     Denosumab (XGEVA [place]) Inject 1 Dose into the skin every 30 (thirty) days.     elacestrant hydrochloride (ORSERDU) 345 MG tablet Take 1 tablet (345 mg total) by mouth daily. Take with food. 30 tablet 3   famotidine (PEPCID) 20 MG tablet Take 1 tablet (20 mg total) by mouth 2 (two) times daily as needed for heartburn or indigestion. (Patient taking differently: Take 20 mg by mouth as needed for heartburn or indigestion.) 30 tablet 3   fluconazole (DIFLUCAN) 200 MG  tablet Take 1 tablet (200 mg total) by mouth daily. 30 tablet 0   folic acid (FOLVITE) 1 MG tablet Take 1 tablet (1 mg total) by mouth daily. 90 tablet 4   furosemide (LASIX) 20 MG tablet Take 1 tablet (20 mg total) by mouth daily. 90 tablet 4   Ibuprofen-Acetaminophen (ADVIL DUAL ACTION) 125-250 MG TABS Take 2 tablets by mouth daily as needed (pain).     ipratropium-albuterol (DUONEB) 0.5-2.5 (3) MG/3ML SOLN Take 3 mLs by nebulization every 6 (six) hours as needed. 360 mL 0   MELATONIN TR 1 MG TBCR TAKE ONE TABLET BY MOUTH EVERYDAY  AT BEDTIME (Patient taking differently: Take 1 mg by mouth at bedtime.) 90 tablet 4   ondansetron (ZOFRAN) 8 MG tablet Take 1 tablet (8 mg total) by mouth every 8 (eight) hours as needed for nausea or vomiting. (Patient taking differently: Take 8 mg by mouth as needed for nausea or vomiting.) 20 tablet 1   OYSTER SHELL CALCIUM PLUS D 500-5 MG-MCG TABS TAKE ONE TABLET BY MOUTH EVERY MORNING and TAKE ONE TABLET BY MOUTH AT NOON and TAKE ONE TABLET BY MOUTH EVERY EVENING (Patient taking differently: Take 1 tablet by mouth 2 (two) times daily.) 90 tablet 1   polyethylene glycol (MIRALAX / GLYCOLAX) 17 g packet Take 17 g by mouth as needed. (Patient taking differently: Take 17 g by mouth daily as needed for mild constipation.) 14 each 6   sertraline (ZOLOFT) 50 MG tablet TAKE 1 AND 1/2 TABLETS BY MOUTH EVERYDAY AT BEDTIME (Patient taking differently: Take 75 mg by mouth at bedtime. When able to remember) 45 tablet 0   triamcinolone ointment (KENALOG) 0.5 % Apply 1 Application topically 2 (two) times daily. (Patient taking differently: Apply 1 Application topically as needed (rash).) 30 g 0    Allergies as of 05/27/2022 - Review Complete 05/27/2022  Allergen Reaction Noted   Ferumoxytol Other (See Comments) 07/14/2021    Family History  Problem Relation Age of Onset   Breast cancer Sister        in her 68's-70s   Heart disease Brother        CABG   Heart disease Brother         CABG   Skin cancer Brother        melanoma   Colon cancer Cousin    Heart attack Mother    Heart attack Father    Prostate cancer Brother     Social History   Socioeconomic History   Marital status: Widowed    Spouse name: Not on file   Number of children: 1   Years of education: Not on file   Highest education level: Not on file  Occupational History   Occupation: Clinical research associate Programs    Comment: Self-employed  Tobacco Use   Smoking status: Never   Smokeless tobacco: Never   Tobacco comments:    second hand smoke exposure  Vaping Use   Vaping Use: Never used  Substance and Sexual Activity   Alcohol use: No   Drug use: No   Sexual activity: Not Currently  Other Topics Concern   Not on file  Social History Narrative   Has a daughter named [name]. Daughter has CP.   Social Determinants of Health   Financial Resource Strain: Low Risk  (08/16/2021)   Overall Financial Resource Strain (CARDIA)    Difficulty of Paying Living Expenses: Not hard at all  Food Insecurity: No Food Insecurity (04/23/2022)   Hunger Vital Sign    Worried About Running Out of Food in the Last Year: Never true    Ran Out of Food in the Last Year: Never true  Transportation Needs: No Transportation Needs (04/23/2022)   PRAPARE - Hydrologist (Medical): No    Lack of Transportation (Non-Medical): No  Physical Activity: Not on file  Stress: Stress Concern Present (11/08/2021)   [place]    Feeling of Stress : To some extent  Social Connections: Socially Isolated (03/27/2021)   Social Connection and Isolation Panel [NHANES]  Frequency of Communication with Friends and Family: Once a week    Frequency of Social Gatherings with Friends and Family: More than three times a week    Attends Religious Services: Never    Marine scientist or Organizations: No    Attends Theatre manager Meetings: Never    Marital Status: Widowed  Intimate Partner Violence: Not At Risk (12/24/2018)   Humiliation, Afraid, Rape, and Kick questionnaire    Fear of Current or Ex-Partner: No    Emotionally Abused: No    Physically Abused: No    Sexually Abused: No    Review of Systems: As per HPI, all others negative  Physical Exam: Vital signs in last 24 hours: Temp:  [96.5 F (35.8 C)-98 F (36.7 C)] 97.6 F (36.4 C) (12/31 1103) Pulse Rate:  [100-118] 102 (12/31 1130) Resp:  [18-24] 22 (12/31 1130) BP: (99-124)/(60-74) 107/74 (12/31 1130) SpO2:  [95 %-100 %] 100 % (12/31 1130)   General:   Alert,  overweight, chronically ill-appearing, older-appearing than stated age Head:  Normocephalic and atraumatic. Eyes:  Sclera clear, no icterus.   Conjunctiva pale Ears:  Normal auditory acuity. Nose:  No deformity, discharge,  or lesions. Mouth:  No deformity or lesions.  Oropharynx pale and dry Neck:  Supple; no masses or thyromegaly. Lungs:  Deconditioned, weak, mild tachypnea Heart:  Tachycardic (low 100-110) Abdomen:  Soft, protuberant, nontender and nondistended. No masses, hepatosplenomegaly or hernias noted. Normal bowel sounds, without guarding, and without rebound.     Msk:  Symmetrical without gross deformities. Normal posture. Pulses:  Normal pulses noted. Extremities:  Without clubbing or edema. Neurologic:  Alert and  oriented x4; diffusely weak-appearing, otherwise grossly normal neurologically. Skin:  Intact without significant lesions or rashes. Cervical Nodes:  No significant cervical adenopathy. Psych:  Alert and cooperative. Normal mood and affect.   Lab Results: Recent Labs    05/27/22 0750  WBC 8.3  HGB 6.3*  HCT 23.2*  PLT 310   BMET Recent Labs    05/27/22 0750  NA 139  K 4.3  CL 104  CO2 28  GLUCOSE 125*  BUN 25*  CREATININE 0.87  CALCIUM 8.4*   LFT Recent Labs    05/27/22 0750  PROT 6.0*  ALBUMIN 2.8*  AST 25  ALT 17   ALKPHOS 84  BILITOT 0.6   PT/INR No results for input(s): "LABPROT", "INR" in the last 72 hours.  Studies/Results: CT ANGIO GI BLEED  Result Date: 05/27/2022 CLINICAL DATA:  Diffuse abdominal pain with dark stools and coffee-ground emesis. Symptomatic anemia. History of metastatic breast cancer EXAM: CTA ABDOMEN AND PELVIS WITHOUT AND WITH CONTRAST TECHNIQUE: Multidetector CT imaging of the abdomen and pelvis was performed using the standard protocol during bolus administration of intravenous contrast. Multiplanar reconstructed images and MIPs were obtained and reviewed to evaluate the vascular anatomy. RADIATION DOSE REDUCTION: This exam was performed according to the departmental dose-optimization program which includes automated exposure control, adjustment of the mA and/or kV according to patient size and/or use of iterative reconstruction technique. CONTRAST:  167m OMNIPAQUE IOHEXOL 350 MG/ML SOLN COMPARISON:  CT 03/05/2022 FINDINGS: VASCULAR Aorta: Normal caliber aorta without aneurysm, dissection, vasculitis or significant stenosis. Scattered atherosclerotic calcification. Celiac: Patent without evidence of aneurysm, dissection, vasculitis or significant stenosis. SMA: Patent without evidence of aneurysm, dissection, vasculitis or significant stenosis. Renals: Both renal arteries are patent without evidence of aneurysm, dissection, vasculitis, fibromuscular dysplasia or significant stenosis. IMA: Patent. Inflow: Patent without evidence of aneurysm,  dissection, vasculitis or significant stenosis. Proximal Outflow: Bilateral common femoral and visualized portions of the superficial and profunda femoral arteries are patent without evidence of aneurysm, dissection, vasculitis or significant stenosis. Veins: Major venous structures including the portal vein and IVC are patent. Review of the MIP images confirms the above findings. NON-VASCULAR Lower chest: Chronic elevation of the right hemidiaphragm  with right basilar atelectasis. Mosaic attenuation of the lung fields. Subtle nodularity along the pleural surfaces of the lung bases is suspicious for metastatic disease. No pleural effusions. Hepatobiliary: Diffuse hepatic metastatic disease. Several of the hepatic masses appear increased in size compared to 03/05/2022. Multiple stones within the gallbladder. No pericholecystic inflammatory changes by CT. Pancreas: Unremarkable. No pancreatic ductal dilatation or surrounding inflammatory changes. Spleen: Normal in size without focal abnormality. Adrenals/Urinary Tract: Unremarkable adrenal glands. Limited assessment for renal calculi given the presence of excreted contrast in the renal collecting systems on pre contrast imaging. No renal lesion or hydronephrosis. Stomach/Bowel: Moderate-large hiatal hernia. No dilated loops of bowel. Normal appendix in the right lower quadrant. Mild scattered diverticulosis. No focal bowel wall thickening or inflammatory changes. No abnormal intraluminal contrast accumulation on multiphasic images to suggest active GI bleed by CT. Lymphatic: No abdominopelvic lymphadenopathy. Reproductive: Uterus and bilateral adnexa are unremarkable. Other: No free fluid. No abdominopelvic fluid collection. No pneumoperitoneum. No abdominal wall hernia. Musculoskeletal: Widespread mixed lytic and sclerotic lesions throughout the skeleton. Unchanged focal kyphosis at the thoracolumbar junction where there are associated chronic compression deformities. Multiple healed bilateral rib fractures. No new pathologic fractures are identified. IMPRESSION: 1. No evidence of active GI bleed by CT. 2. No acute abdominopelvic findings. 3. Diffuse hepatic metastatic disease. Several of the hepatic masses appear increased in size compared to 03/05/2022. 4. Subtle nodularity along the pleural surfaces of the lung bases is suspicious for metastatic disease. 5. Widespread osseous metastatic disease. No new  pathologic fractures are identified. 6. Moderate-large hiatal hernia. 7. Cholelithiasis. Electronically Signed   By: Davina Poke D.O.   On: 05/27/2022 10:26   DG Chest Portable 1 View  Result Date: 05/27/2022 CLINICAL DATA:  61 year old female with history of exertional shortness of breath and chest pressure. EXAM: PORTABLE CHEST 1 VIEW COMPARISON:  Chest x-ray 04/17/2022. FINDINGS: Chronic severe elevation of the right hemidiaphragm is again noted with associated areas of passive subsegmental atelectasis and/or scarring in the right lung base. Left lung is clear. No pleural effusions. No pneumothorax. No evidence of pulmonary edema. Heart size is normal. Upper mediastinal contours are within normal limits. Atherosclerotic calcifications in the thoracic aorta. Large hiatal hernia. IMPRESSION: 1. Radiographic appearance the chest is unchanged, with no definitive evidence to suggest acute cardiopulmonary disease. 2. Aortic atherosclerosis. 3. Large hiatal hernia. Electronically Signed   By: Vinnie Langton M.D.   On: 05/27/2022 08:28    Impression:   Coffee ground and red emesis, with melena.   Metastatic breast cancer (to liver, to bones) Anemia, at least partial acute blood loss component.  Plan:   PPI, volume repletion, blood transfuse. Follow CBCs. Keep NPO; will try to add on for EGD today. Risks (bleeding, infection, bowel perforation that could require surgery, sedation-related changes in cardiopulmonary systems), benefits (identification and possible treatment of source of symptoms, exclusion of certain causes of symptoms), and alternatives (watchful waiting, radiographic imaging studies, empiric medical treatment) of upper endoscopy (EGD) were explained to patient/family in detail and patient wishes to proceed.  Next step in management pending endoscopy findings.   LOS: 0 days  Landry Dyke  05/27/2022, 12:19 PM  Cell [phone] If no answer or after 5 PM call  [phone]

## 2022-05-27 NOTE — Interval H&P Note (Signed)
History and Physical Interval Note:  05/27/2022 2:00 PM  Suzanne [name]  has presented today for surgery, with the diagnosis of melena, hematemesis.  The various methods of treatment have been discussed with the patient and family. After consideration of risks, benefits and other options for treatment, the patient has consented to  Procedure(s): ESOPHAGOGASTRODUODENOSCOPY (EGD) WITH PROPOFOL (N/A) as a surgical intervention.  The patient's history has been reviewed, patient examined, no change in status, stable for surgery.  I have reviewed the patient's chart and labs.  Questions were answered to the patient's satisfaction.     Landry Dyke

## 2022-05-27 NOTE — Transfer of Care (Signed)
Immediate Anesthesia Transfer of Care Note  Patient: Suzanne [name]  Procedure(s) Performed: ESOPHAGOGASTRODUODENOSCOPY (EGD) WITH PROPOFOL  Patient Location: PACU  Anesthesia Type:MAC  Level of Consciousness: awake, alert , and oriented  Airway & Oxygen Therapy: Patient Spontanous Breathing and Patient connected to face mask oxygen  Post-op Assessment: Report given to RN and Post -op Vital signs reviewed and stable  Post vital signs: Reviewed and stable  Last Vitals:  Vitals Value Taken Time  BP 95/53 05/27/22 1432  Temp    Pulse 90 05/27/22 1433  Resp 20 05/27/22 1433  SpO2 100 % 05/27/22 1433  Vitals shown include unvalidated device data.  Last Pain:  Vitals:   05/27/22 1344  TempSrc: Temporal  PainSc: 0-No pain         Complications: No notable events documented.

## 2022-05-27 NOTE — ED Notes (Signed)
Patient transported to CT 

## 2022-05-27 NOTE — ED Provider Notes (Signed)
[facility] DEPT Provider Note   CSN: [id number] Arrival date & time: 05/27/22  0702     History  Chief Complaint  Patient presents with   Emesis    Suzanne [name] is a 61 y.o. female.  The history is provided by the patient and medical records. No language interpreter was used.  Emesis Severity:  Moderate Duration:  1 day Timing:  Intermittent Quality:  Coffee grounds Progression:  Unchanged Chronicity:  New Recent urination:  Normal Relieved by:  Nothing Worsened by:  Nothing Ineffective treatments:  None tried Associated symptoms: abdominal pain and chills   Associated symptoms: no cough, no diarrhea, no fever, no headaches, no myalgias, no sore throat and no URI        Home Medications Prior to Admission medications   Medication Sig Start Date End Date Taking? Authorizing Provider  acetaminophen (TYLENOL) 500 MG tablet Take 1,000 mg by mouth daily as needed for mild pain.    [provider]  Calcium Carbonate Antacid (TUMS PO) Take 3 tablets by mouth as needed (hearburn).    [provider]  cetirizine (ZYRTEC) 10 MG tablet Take 10 mg by mouth daily.    [provider]  Denosumab (XGEVA Aubrey) Inject 1 Dose into the skin every 30 (thirty) days.    [provider]  elacestrant hydrochloride (ORSERDU) 345 MG tablet Take 1 tablet (345 mg total) by mouth daily. Take with food. 04/25/22   Benay Pike, MD  famotidine (PEPCID) 20 MG tablet Take 1 tablet (20 mg total) by mouth 2 (two) times daily as needed for heartburn or indigestion. Patient taking differently: Take 20 mg by mouth as needed for heartburn or indigestion. 07/28/20   [name], Virgie Dad, MD  fluconazole (DIFLUCAN) 200 MG tablet Take 1 tablet (200 mg total) by mouth daily. 04/06/22   Gardenia Phlegm, NP  folic acid (FOLVITE) 1 MG tablet Take 1 tablet (1 mg total) by mouth daily. 09/29/21   Benay Pike, MD  furosemide (LASIX) 20 MG  tablet Take 1 tablet (20 mg total) by mouth daily. 09/29/21   Benay Pike, MD  Ibuprofen-Acetaminophen (ADVIL DUAL ACTION) 125-250 MG TABS Take 2 tablets by mouth daily as needed (pain).    [provider]  ipratropium-albuterol (DUONEB) 0.5-2.5 (3) MG/3ML SOLN Take 3 mLs by nebulization every 6 (six) hours as needed. 05/03/22   Iruku, Arletha Pili, MD  MELATONIN TR 1 MG TBCR TAKE ONE TABLET BY MOUTH EVERYDAY AT BEDTIME Patient taking differently: Take 1 mg by mouth at bedtime. 04/03/22   Benay Pike, MD  ondansetron (ZOFRAN) 8 MG tablet Take 1 tablet (8 mg total) by mouth every 8 (eight) hours as needed for nausea or vomiting. Patient taking differently: Take 8 mg by mouth as needed for nausea or vomiting. 02/26/22   Benay Pike, MD  OYSTER SHELL CALCIUM PLUS D 500-5 MG-MCG TABS TAKE ONE TABLET BY MOUTH EVERY MORNING and TAKE ONE TABLET BY MOUTH AT NOON and TAKE ONE TABLET BY MOUTH EVERY EVENING Patient taking differently: Take 1 tablet by mouth 2 (two) times daily. 04/27/21   [name], Virgie Dad, MD  polyethylene glycol (MIRALAX / GLYCOLAX) 17 g packet Take 17 g by mouth as needed. Patient taking differently: Take 17 g by mouth daily as needed for mild constipation. 07/28/20   [name], Virgie Dad, MD  sertraline (ZOLOFT) 50 MG tablet TAKE 1 AND 1/2 TABLETS BY MOUTH EVERYDAY AT BEDTIME Patient taking differently: Take 75 mg by mouth at  bedtime. When able to remember 12/25/21   Charlott Rakes, MD  triamcinolone ointment (KENALOG) 0.5 % Apply 1 Application topically 2 (two) times daily. Patient taking differently: Apply 1 Application topically as needed (rash). 04/02/22   Benay Pike, MD      Allergies    Ferumoxytol    Review of Systems   Review of Systems  Constitutional:  Positive for chills and fatigue. Negative for diaphoresis and fever.  HENT:  Negative for congestion and sore throat.   Eyes:  Negative for visual disturbance.  Respiratory:  Positive for shortness of breath.  Negative for cough, chest tightness and wheezing.   Cardiovascular:  Positive for chest pain (pressure). Negative for palpitations and leg swelling.  Gastrointestinal:  Positive for abdominal pain, blood in stool (dark black stool on friday per pt), nausea and vomiting. Negative for constipation and diarrhea.  Genitourinary:  Negative for dysuria and flank pain.  Musculoskeletal:  Negative for back pain, myalgias, neck pain and neck stiffness.  Skin:  Negative for wound.  Neurological:  Positive for light-headedness. Negative for dizziness, weakness, numbness and headaches.  Psychiatric/Behavioral:  Negative for agitation.   All other systems reviewed and are negative.   Physical Exam Updated Vital Signs BP 106/67   Resp 19   LMP 05/28/2013 (Within Months)  Physical Exam Vitals and nursing note reviewed.  Constitutional:      General: She is not in acute distress.    Appearance: She is well-developed. She is not ill-appearing, toxic-appearing or diaphoretic.  HENT:     Head: Normocephalic and atraumatic.     Mouth/Throat:     Mouth: Mucous membranes are dry.  Eyes:     Extraocular Movements: Extraocular movements intact.     Conjunctiva/sclera: Conjunctivae normal.     Pupils: Pupils are equal, round, and reactive to light.  Cardiovascular:     Rate and Rhythm: Regular rhythm. Tachycardia present.     Heart sounds: No murmur heard. Pulmonary:     Effort: Pulmonary effort is normal. No respiratory distress.     Breath sounds: Normal breath sounds. No wheezing, rhonchi or rales.  Chest:     Chest wall: No tenderness.  Abdominal:     General: Abdomen is flat.     Palpations: Abdomen is soft.     Tenderness: There is abdominal tenderness. There is no guarding or rebound.  Musculoskeletal:        General: No swelling or tenderness.     Cervical back: Neck supple. No tenderness.     Right lower leg: No edema.     Left lower leg: No edema.  Skin:    General: Skin is warm and  dry.     Capillary Refill: Capillary refill takes less than 2 seconds.     Coloration: Skin is pale.     Findings: No erythema.  Neurological:     General: No focal deficit present.     Mental Status: She is alert.     Sensory: No sensory deficit.     Motor: No weakness.  Psychiatric:        Mood and Affect: Mood normal.     ED Results / Procedures / Treatments   Labs (all labs ordered are listed, but only abnormal results are displayed) Labs Reviewed  CBC WITH DIFFERENTIAL/PLATELET - Abnormal; Notable for the following components:      Result Value   RBC 2.46 (*)    Hemoglobin 6.3 (*)    HCT 23.2 (*)  MCH 25.6 (*)    MCHC 27.2 (*)    RDW 19.4 (*)    nRBC 0.5 (*)    All other components within normal limits  COMPREHENSIVE METABOLIC PANEL - Abnormal; Notable for the following components:   Glucose, Bld 125 (*)    BUN 25 (*)    Calcium 8.4 (*)    Total Protein 6.0 (*)    Albumin 2.8 (*)    All other components within normal limits  RESP PANEL BY RT-PCR (RSV, FLU A&B, COVID)  RVPGX2  URINE CULTURE  LACTIC ACID, PLASMA  LACTIC ACID, PLASMA  LIPASE, BLOOD  BRAIN NATRIURETIC PEPTIDE  URINALYSIS, ROUTINE W REFLEX MICROSCOPIC  POC OCCULT BLOOD, ED  TYPE AND SCREEN  PREPARE RBC (CROSSMATCH)  TROPONIN I (HIGH SENSITIVITY)  TROPONIN I (HIGH SENSITIVITY)    EKG EKG Interpretation  Date/Time:  Sunday May 27 2022 08:05:42 EST Ventricular Rate:  104 PR Interval:  170 QRS Duration: 84 QT Interval:  338 QTC Calculation: 445 R Axis:   26 Text Interpretation: Sinus tachycardia Borderline T abnormalities, inferior leads when compared to prior, similar appearance. No STEMI Confirmed by Antony Blackbird [phone]) on 05/27/2022 8:07:32 AM  Radiology CT ANGIO GI BLEED  Result Date: 05/27/2022 CLINICAL DATA:  Diffuse abdominal pain with dark stools and coffee-ground emesis. Symptomatic anemia. History of metastatic breast cancer EXAM: CTA ABDOMEN AND PELVIS WITHOUT AND  WITH CONTRAST TECHNIQUE: Multidetector CT imaging of the abdomen and pelvis was performed using the standard protocol during bolus administration of intravenous contrast. Multiplanar reconstructed images and MIPs were obtained and reviewed to evaluate the vascular anatomy. RADIATION DOSE REDUCTION: This exam was performed according to the departmental dose-optimization program which includes automated exposure control, adjustment of the mA and/or kV according to patient size and/or use of iterative reconstruction technique. CONTRAST:  113m OMNIPAQUE IOHEXOL 350 MG/ML SOLN COMPARISON:  CT 03/05/2022 FINDINGS: VASCULAR Aorta: Normal caliber aorta without aneurysm, dissection, vasculitis or significant stenosis. Scattered atherosclerotic calcification. Celiac: Patent without evidence of aneurysm, dissection, vasculitis or significant stenosis. SMA: Patent without evidence of aneurysm, dissection, vasculitis or significant stenosis. Renals: Both renal arteries are patent without evidence of aneurysm, dissection, vasculitis, fibromuscular dysplasia or significant stenosis. IMA: Patent. Inflow: Patent without evidence of aneurysm, dissection, vasculitis or significant stenosis. Proximal Outflow: Bilateral common femoral and visualized portions of the superficial and profunda femoral arteries are patent without evidence of aneurysm, dissection, vasculitis or significant stenosis. Veins: Major venous structures including the portal vein and IVC are patent. Review of the MIP images confirms the above findings. NON-VASCULAR Lower chest: Chronic elevation of the right hemidiaphragm with right basilar atelectasis. Mosaic attenuation of the lung fields. Subtle nodularity along the pleural surfaces of the lung bases is suspicious for metastatic disease. No pleural effusions. Hepatobiliary: Diffuse hepatic metastatic disease. Several of the hepatic masses appear increased in size compared to 03/05/2022. Multiple stones within the  gallbladder. No pericholecystic inflammatory changes by CT. Pancreas: Unremarkable. No pancreatic ductal dilatation or surrounding inflammatory changes. Spleen: Normal in size without focal abnormality. Adrenals/Urinary Tract: Unremarkable adrenal glands. Limited assessment for renal calculi given the presence of excreted contrast in the renal collecting systems on pre contrast imaging. No renal lesion or hydronephrosis. Stomach/Bowel: Moderate-large hiatal hernia. No dilated loops of bowel. Normal appendix in the right lower quadrant. Mild scattered diverticulosis. No focal bowel wall thickening or inflammatory changes. No abnormal intraluminal contrast accumulation on multiphasic images to suggest active GI bleed by CT. Lymphatic: No abdominopelvic lymphadenopathy. Reproductive: Uterus and  bilateral adnexa are unremarkable. Other: No free fluid. No abdominopelvic fluid collection. No pneumoperitoneum. No abdominal wall hernia. Musculoskeletal: Widespread mixed lytic and sclerotic lesions throughout the skeleton. Unchanged focal kyphosis at the thoracolumbar junction where there are associated chronic compression deformities. Multiple healed bilateral rib fractures. No new pathologic fractures are identified. IMPRESSION: 1. No evidence of active GI bleed by CT. 2. No acute abdominopelvic findings. 3. Diffuse hepatic metastatic disease. Several of the hepatic masses appear increased in size compared to 03/05/2022. 4. Subtle nodularity along the pleural surfaces of the lung bases is suspicious for metastatic disease. 5. Widespread osseous metastatic disease. No new pathologic fractures are identified. 6. Moderate-large hiatal hernia. 7. Cholelithiasis. Electronically Signed   By: Davina Poke D.O.   On: 05/27/2022 10:26   DG Chest Portable 1 View  Result Date: 05/27/2022 CLINICAL DATA:  61 year old female with history of exertional shortness of breath and chest pressure. EXAM: PORTABLE CHEST 1 VIEW  COMPARISON:  Chest x-ray 04/17/2022. FINDINGS: Chronic severe elevation of the right hemidiaphragm is again noted with associated areas of passive subsegmental atelectasis and/or scarring in the right lung base. Left lung is clear. No pleural effusions. No pneumothorax. No evidence of pulmonary edema. Heart size is normal. Upper mediastinal contours are within normal limits. Atherosclerotic calcifications in the thoracic aorta. Large hiatal hernia. IMPRESSION: 1. Radiographic appearance the chest is unchanged, with no definitive evidence to suggest acute cardiopulmonary disease. 2. Aortic atherosclerosis. 3. Large hiatal hernia. Electronically Signed   By: Vinnie Langton M.D.   On: 05/27/2022 08:28    Procedures Procedures    CRITICAL CARE Performed by: Gwenyth Allegra Rainah Kirshner Total critical care time: 35 minutes Critical care time was exclusive of separately billable procedures and treating other patients. Critical care was necessary to treat or prevent imminent or life-threatening deterioration. Critical care was time spent personally by me on the following activities: development of treatment plan with patient and/or surrogate as well as nursing, discussions with consultants, evaluation of patient's response to treatment, examination of patient, obtaining history from patient or surrogate, ordering and performing treatments and interventions, ordering and review of laboratory studies, ordering and review of radiographic studies, pulse oximetry and re-evaluation of patient's condition.   Medications Ordered in ED Medications  0.9 %  sodium chloride infusion (Manually program via Guardrails IV Fluids) (0 mLs Intravenous Stopped 05/27/22 1043)  iohexol (OMNIPAQUE) 350 MG/ML injection 100 mL (100 mLs Intravenous Contrast Given 05/27/22 0952)  pantoprazole (PROTONIX) injection 40 mg (40 mg Intravenous Given 05/27/22 1044)    ED Course/ Medical Decision Making/ A&P                            Medical Decision Making Amount and/or Complexity of Data Reviewed Labs: ordered. Radiology: ordered.  Risk Prescription drug management. Decision regarding hospitalization.    Suzanne [name] is a 61 y.o. female with a past medical history significant for stage IV metastatic breast cancer, chronic anemia requiring previous transfusion, gallstones, and admission last month for bronchitis who presents with 2 days of abdominal discomfort, dark tarry stool on Friday, exertional shortness of breath, fatigue, some chest pressure, and coffee-ground emesis overnight.  According to patient, 2 days ago on Friday, she did have dark stools and said she has had some abdominal cramping and discomfort mildly for the last few days.  She reports overnight she start having more pain that was up to 7 out of 10 across her abdomen.  She reports it was more of a dull ache.  He said that she woke up early this morning and vomited a dark coffee-ground appearing vomit.  She has never had GI bleeds before that she thinks but has had chronic anemia requiring transfusion.  She then went to the bathroom and had another episode of the dark vomiting.  She was feeling fatigued and lightheaded but has had no syncope.  She feels very drained and tired.  She does have some chills and said her temperature is "normally around 97 but was up to 99 yesterday".  She denies any congestion or cough and denies any active chest pain now.  She reports she has intermittent chest pressure earlier.  Her shortness breath is more exertional and she uses 2 L at home constantly and oxygen saturations are normal on arrival.  She denies any urinary changes.  On exam, lungs were clear.  Chest was nontender.  Abdomen was tender and there were bowel sounds appreciated.  Back and flanks nontender.  Patient does have pale mucous membranes are dry.  No murmur meniscal auscultation.  Patient is tachycardic and pressures are in the 90s.  Clinically I am  concerned about upper GI bleed leading to symptomatic anemia with the tachycardia, soft pressures, exertional shortness of breath, chest tightness, and abdominal pain with dark vomit and dark stools.  Fecal occult was performed and was negative however I am not sure that I trust it as there was not a large stool sample collected.  We will get screening labs to see what her kidney function is.  Anticipate getting a CT scan of the abdomen pelvis to further evaluate her abdominal pain.  Will get chest x-ray and cardiac workup as well.  Anticipate admission for suspected symptomatic anemia in the setting of GI bleed when workup is completed.  That she does not see a GI doctor and is still taking chemotherapy medications for her cancer.  9:03 AM Patient's workup began to return.  Her hemoglobin had dropped to 6.3 as I suspected for symptomatic anemia.  Will give a unit of blood.  Her creatinine is normal so we will get the CT scan for GI bleeding and abdominal pain.  Her lactic acid is normal, lipase normal, and BNP is normal.  COVID flu and RSV negative.  Troponin negative. EKG did not show STEMI.  Chest x-ray reassuring.  After CT scan, anticipate admission.  CT scan does not show acute abnormality aside from worsened apparent metastasis burden.  They do not see evidence of acute GI bleeding or active hemorrhage at this time.  Spoke with Dr. Paulita Fujita with GI who will come see patient and agrees with IV Protonix and admission for hemoglobin trending and they will see the patient.  Will call medicine for admission for symptomatic anemia with suspected GI bleed.        Final Clinical Impression(s) / ED Diagnoses Final diagnoses:  Symptomatic anemia  Gastrointestinal hemorrhage, unspecified gastrointestinal hemorrhage type  Exertional shortness of breath     Clinical Impression: 1. Symptomatic anemia   2. Gastrointestinal hemorrhage, unspecified gastrointestinal hemorrhage type   3.  Exertional shortness of breath     Disposition: Admit  This note was prepared with assistance of Dragon voice recognition software. Occasional wrong-word or sound-a-like substitutions may have occurred due to the inherent limitations of voice recognition software.     Geremiah Fussell, Gwenyth Allegra, MD 05/27/22 [phone]

## 2022-05-27 NOTE — H&P (Signed)
History and Physical    Patient: Suzanne [name] VOZ:[id number] DOB: 17-Nov-1960 DOA: 05/27/2022 DOS: the patient was seen and examined on 05/27/2022 PCP: [name], NP  Patient coming from: Home  Chief Complaint:  Chief Complaint  Patient presents with   Emesis   HPI: Suzanne [name] is a 61 y.o. 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  She had an episode of melena on Friday.  EMS was called and she was brought to the emergency department.  No recent use of NSAIDs, spicy foods, alcohol, carbonated drinks or caffeine intake.  No fever, chills or night sweats. No sore throat, rhinorrhea, dyspnea, wheezing or hemoptysis. No chest pain, palpitations, diaphoresis, PND, orthopnea or pitting edema of the lower extremities.  No appetite changes, diarrhea, constipation or hematochezia.  No flank pain, dysuria, frequency or hematuria.  No polyuria, polydipsia, polyphagia or blurred vision.  ED course: Initial vital signs were temperature 96.5 F, pulse 111, respirations 19, BP 106/67 mmHg and O2 sat 95% on 2 L via nasal cannula.  The patient received pantoprazole 40 mg IVP x 1.  Lab work: Coronavirus, RSV and influenza PCR negative.  CBC with a white count of 8.3, hemoglobin 6.3 g deciliter platelets 310.  Lactic acid was normal x 2.  Troponin x 2, BNP and lactic acid x 2 normal.  CMP showed normal electrolytes after calcium correction.  Glucose 125, BUN 25 and creatinine 0.87 mg/dL.  Total protein 6.0 and albumin 2.8 g/dL, the rest of the LFTs were normal.  Imaging: Portable 1 view chest radiograph with no evidence  to suggest acute cardiopulmonary disease.  Aortic atherosclerosis.  Large hiatal hernia and chronic severe elevation of the right hemidiaphragm with associated areas of passive subsegmental atelectasis and/or scaring in the right lung base.  Left lung is clear.   Review of Systems: As mentioned in the history of present illness. All other systems reviewed and are negative. Past Medical History:  Diagnosis Date   Cancer [facility])    Family history of breast cancer    Family history of colon cancer    Family history of melanoma    Family history of prostate cancer    Gallstones    Glaucoma    Mastitis    reports history of recurrent mastitis   Metastatic breast cancer    Obesity    Past Surgical History:  Procedure Laterality Date   GLAUCOMA SURGERY  late 1990's   IR THORACENTESIS ASP PLEURAL SPACE W/IMG GUIDE  10/28/2018   IR THORACENTESIS ASP PLEURAL SPACE W/IMG GUIDE  10/29/2018   OPEN REDUCTION INTERNAL FIXATION (ORIF) DISTAL RADIAL FRACTURE Left 01/09/2019   Procedure: OPEN REDUCTION INTERNAL FIXATION (ORIF) HUMERAL FRACTURE;  Surgeon: Netta Cedars, MD;  Location: [facility];  Service: Orthopedics;  Laterality: Left;   Social History:  reports that she has never smoked. She has never used smokeless tobacco. She reports that she does not drink alcohol and does not use drugs.  Allergies  Allergen Reactions   Ferumoxytol Other (See Comments)    Pt had hypersensitivity to Ferumoxytol. She had Facial Flushing. See note from 07/14/2021    Family History  Problem Relation Age of Onset  Breast cancer Sister        in her 37's-70s   Heart disease Brother        CABG   Heart disease Brother        CABG   Skin cancer Brother        melanoma   Colon cancer Cousin    Heart attack Mother    Heart attack Father    Prostate cancer Brother     Prior to Admission medications   Medication Sig Start Date End Date Taking? Authorizing Provider  acetaminophen (TYLENOL) 500 MG tablet Take 1,000 mg  by mouth daily as needed for mild pain.    [provider]  Calcium Carbonate Antacid (TUMS PO) Take 3 tablets by mouth as needed (he[place]burn).    [provider]  cetirizine (ZYRTEC) 10 MG tablet Take 10 mg by mouth daily.    [provider]  Denosumab (XGEVA [place]) Inject 1 Dose into the skin every 30 (thirty) days.    [provider]  elacestrant hydrochloride (ORSERDU) 345 MG tablet Take 1 tablet (345 mg total) by mouth daily. Take with food. 04/25/22   Benay Pike, MD  famotidine (PEPCID) 20 MG tablet Take 1 tablet (20 mg total) by mouth 2 (two) times daily as needed for heartburn or indigestion. Patient taking differently: Take 20 mg by mouth as needed for heartburn or indigestion. 07/28/20   [name], Virgie Dad, MD  fluconazole (DIFLUCAN) 200 MG tablet Take 1 tablet (200 mg total) by mouth daily. 04/06/22   Gardenia Phlegm, NP  folic acid (FOLVITE) 1 MG tablet Take 1 tablet (1 mg total) by mouth daily. 09/29/21   Benay Pike, MD  furosemide (LASIX) 20 MG tablet Take 1 tablet (20 mg total) by mouth daily. 09/29/21   Benay Pike, MD  Ibuprofen-Acetaminophen (ADVIL DUAL ACTION) 125-250 MG TABS Take 2 tablets by mouth daily as needed (pain).    [provider]  ipratropium-albuterol (DUONEB) 0.5-2.5 (3) MG/3ML SOLN Take 3 mLs by nebulization every 6 (six) hours as needed. 05/03/22   Iruku, Arletha Pili, MD  MELATONIN TR 1 MG TBCR TAKE ONE TABLET BY MOUTH EVERYDAY AT BEDTIME Patient taking differently: Take 1 mg by mouth at bedtime. 04/03/22   Benay Pike, MD  ondansetron (ZOFRAN) 8 MG tablet Take 1 tablet (8 mg total) by mouth every 8 (eight) hours as needed for nausea or vomiting. Patient taking differently: Take 8 mg by mouth as needed for nausea or vomiting. 02/26/22   Benay Pike, MD  OYSTER SHELL CALCIUM PLUS D 500-5 MG-MCG TABS TAKE ONE TABLET BY MOUTH EVERY MORNING and TAKE ONE TABLET BY MOUTH AT NOON and TAKE ONE TABLET BY MOUTH EVERY  EVENING Patient taking differently: Take 1 tablet by mouth 2 (two) times daily. 04/27/21   [name], Virgie Dad, MD  polyethylene glycol (MIRALAX / GLYCOLAX) 17 g packet Take 17 g by mouth as needed. Patient taking differently: Take 17 g by mouth daily as needed for mild constipation. 07/28/20   [name], Virgie Dad, MD  sertraline (ZOLOFT) 50 MG tablet TAKE 1 AND 1/2 TABLETS BY MOUTH EVERYDAY AT BEDTIME Patient taking differently: Take 75 mg by mouth at bedtime. When able to remember 12/25/21   Charlott Rakes, MD  triamcinolone ointment (KENALOG) 0.5 % Apply 1 Application topically 2 (two) times daily. Patient taking differently: Apply 1 Application topically as needed (rash). 04/02/22   Benay Pike, MD    Physical Exam: Vitals:   05/27/22 1015 05/27/22 1041 05/27/22  1103 05/27/22 1130  BP: 123/64 118/70 112/60 107/74  Pulse: 100 (!) 107 (!) 106 (!) 102  Resp: (!) 24 (!) 23 (!) 22 (!) 22  Temp:  98 F (36.7 C) 97.6 F (36.4 C)   TempSrc:  Oral Oral   SpO2: 100% 100% 100% 100%   Physical Exam Vitals and nursing note reviewed.  Constitutional:      General: She is awake. She is not in acute distress.    Appearance: Normal appearance. She is ill-appearing.  HENT:     Head: Normocephalic.     Nose: No rhinorrhea.     Mouth/Throat:     Mouth: Mucous membranes are dry.  Eyes:     General: No scleral icterus.    Pupils: Pupils are equal, round, and reactive to light.  Neck:     Vascular: No JVD.  Cardiovascular:     Rate and Rhythm: Normal rate and regular rhythm.     Heart sounds: S1 normal and S2 normal.  Pulmonary:     Effort: Pulmonary effort is normal.     Breath sounds: Examination of the right-lower field reveals decreased breath sounds. Examination of the left-lower field reveals decreased breath sounds. Decreased breath sounds present. No wheezing, rhonchi or rales.  Abdominal:     General: Bowel sounds are normal. There is no distension.     Palpations: Abdomen is soft.      Tenderness: There is no abdominal tenderness. There is no guarding.  Musculoskeletal:     Cervical back: Neck supple.     Right lower leg: No edema.     Left lower leg: No edema.  Skin:    General: Skin is warm and dry.  Neurological:     General: No focal deficit present.     Mental Status: She is alert and oriented to person, place, and time.  Psychiatric:        Mood and Affect: Mood normal.        Behavior: Behavior normal. Behavior is cooperative.   Data Reviewed:  Results are pending, will review when available.  Assessment and Plan: Principal Problem:   UGI bleed Admit to PCU/inpatient. Keep NPO for now. Continue IV fluids. Continue pantoprazole infusion. Monitor H&H. Transfuse as needed. GI consult greatly appreciated.  Active Problems:   Malignant neoplasm metastatic to bone [facility]) Analgesics as needed.    Pleural effusion, bilateral Secondary to breast cancer metastasis. Continue supplemental oxygen. Bronchodilators as needed.    Essential hypertension BP has been soft. Monitor blood pressure. Antihypertensives as needed.    Class 2 obesity Lifestyle modifications.    Hypoalbuminemia Moderate protein calorie malnutrition. Protein supplementation twice daily or 3 times daily. Consider nutritional services evaluation.        Advance Care Planning:   Code Status: Full Code   Consults: [facility].   Family Communication:   Severity of Illness: The appropriate patient status for this patient is INPATIENT. Inpatient status is judged to be reasonable and necessary in order to provide the required intensity of service to ensure the patient's safety. The patient's presenting symptoms, physical exam findings, and initial radiographic and laboratory data in the context of their chronic comorbidities is felt to place them at high risk for further clinical deterioration. Furthermore, it is not anticipated that the patient will be medically stable for  discharge from the hospital within 2 midnights of admission.   * I certify that at the point of admission it is my clinical judgment that the  patient will require inpatient hospital care spanning beyond 2 midnights from the point of admission due to high intensity of service, high risk for further deterioration and high frequency of surveillance required.*  Author: Reubin [name], MD 05/27/2022 11:47 AM  For on call review [email or web address].   This document was prepared using Dragon voice recognition software and may contain some unintended transcription errors.

## 2022-05-27 NOTE — Anesthesia Preprocedure Evaluation (Addendum)
Anesthesia Evaluation  Patient identified by MRN, date of birth, ID band Patient awake    Reviewed: Allergy & Precautions, NPO status , Patient's Chart, lab work & pertinent test results  History of Anesthesia Complications Negative for: history of anesthetic complications  Airway Mallampati: II  TM Distance: >3 FB Neck ROM: Full    Dental   Pulmonary shortness of breath and Long-Term Oxygen Therapy   Pulmonary exam normal        Cardiovascular hypertension, Normal cardiovascular exam  Echo: EF 70-75%, g1dd   Neuro/Psych negative neurological ROS     GI/Hepatic Neg liver ROS,,,Melena, hematemesis, suspected UGIB   Endo/Other  negative endocrine ROS    Renal/GU negative Renal ROS  negative genitourinary   Musculoskeletal negative musculoskeletal ROS (+)    Abdominal   Peds  Hematology  (+) Blood dyscrasia, anemia Hgb 6.3, receiving transfusion of PRBC   Anesthesia Other Findings Stage IV metastatic breast cancer  Reproductive/Obstetrics                             Anesthesia Physical Anesthesia Plan  ASA: 3 and emergent  Anesthesia Plan: MAC   Post-op Pain Management: Minimal or no pain anticipated   Induction: Intravenous  PONV Risk Score and Plan: 2 and Propofol infusion, TIVA and Treatment may vary due to age or medical condition  Airway Management Planned: Natural Airway, Nasal Cannula and Simple Face Mask  Additional Equipment: None  Intra-op Plan:   Post-operative Plan:   Informed Consent: I have reviewed the patients History and Physical, chart, labs and discussed the procedure including the risks, benefits and alternatives for the proposed anesthesia with the patient or authorized representative who has indicated his/her understanding and acceptance.       Plan Discussed with:   Anesthesia Plan Comments:         Anesthesia Quick Evaluation

## 2022-05-27 NOTE — Op Note (Signed)
[facility] Patient Name: Suzanne [name] Procedure Date: 05/27/2022 MRN: [id number] Attending MD: Arta Silence , MD, [id number] Date of Birth: Aug 15, 1960 CSN: [id number] Age: 61 Admit Type: Outpatient Procedure:                Upper GI endoscopy Indications:              Acute post hemorrhagic anemia, Hematemesis, Melena Providers:                Arta Silence, MD, Jeanella Cara, RN, Cletis Athens, Technician Referring MD:             [facility] Medicines:                Monitored Anesthesia Care Complications:            No immediate complications. Estimated Blood Loss:     Estimated blood loss: none. Procedure:                Pre-Anesthesia Assessment:                           - Prior to the procedure, a History and Physical                            was performed, and patient medications and                            allergies were reviewed. The patient's tolerance of                            previous anesthesia was also reviewed. The risks                            and benefits of the procedure and the sedation                            options and risks were discussed with the patient.                            All questions were answered, and informed consent                            was obtained. Prior Anticoagulants: The patient has                            taken no anticoagulant or antiplatelet agents. ASA                            Grade Assessment: III - A patient with severe                            systemic disease. After reviewing the risks and  benefits, the patient was deemed in satisfactory                            condition to undergo the procedure.                           After obtaining informed consent, the endoscope was                            passed under direct vision. Throughout the                            procedure, the patient's blood pressure,  pulse, and                            oxygen saturations were monitored continuously. The                            [id number]-H190 ([id number]) Olympus endoscope was introduced                            through the mouth, and advanced to the second part                            of duodenum. The upper GI endoscopy was                            accomplished without difficulty. The patient                            tolerated the procedure well. Scope In: Scope Out: Findings:      A large hiatal hernia was present.      A few dispersed medium erosions with no stigmata of recent bleeding were       found in the cardia and in the gastric fundus, along hiatal hernia sac.      One non-bleeding cratered gastric ulcer with no stigmata of bleeding was       found in the gastric fundus/body junction along lesser curvature. The       lesion was 6 mm in largest dimension.      A medium amount of food (residue) and possibly old clot material was       found in the cardia and in the gastric fundus. Mucosal views in these       regions were compromised.      The exam of the stomach was otherwise normal.      The duodenal bulb, first portion of the duodenum and second portion of       the duodenum were normal.      No old or fresh blood was seen to the extent of our examination. Impression:               - Large hiatal hernia.                           - Erosive gastropathy with no stigmata of recent  bleeding.                           - Non-bleeding gastric ulcer with no stigmata of                            bleeding.                           - A medium amount of food (residue) in the stomach.                           - Normal duodenal bulb, first portion of the                            duodenum and second portion of the duodenum.                           - No specimens collected. Moderate Sedation:      Not Applicable - Patient had care per Anesthesia. Recommendation:            - Return patient to hospital ward for ongoing care.                           - Clear liquid diet today.                           - Continue present medications.                           [facility] will follow. Procedure Code(s):        --- Professional ---                           [id number], Esophagogastroduodenoscopy, flexible,                            transoral; diagnostic, including collection of                            specimen(s) by brushing or washing, when performed                            (separate procedure) Diagnosis Code(s):        --- Professional ---                           K44.9, Diaphragmatic hernia without obstruction or                            gangrene                           K31.89, Other diseases of stomach and duodenum                           K25.9, Gastric ulcer, unspecified as acute or  chronic, without hemorrhage or perforation                           D62, Acute posthemorrhagic anemia                           K92.0, Hematemesis                           K92.1, Melena (includes Hematochezia) CPT copyright 2022 American [employer] Association. All rights reserved. The codes documented in this report are preliminary and upon coder review may  be revised to meet current compliance requirements. Arta Silence, MD 05/27/2022 2:32:30 PM This report has been signed electronically. Number of Addenda: 0

## 2022-05-27 NOTE — Anesthesia Postprocedure Evaluation (Signed)
Anesthesia Post Note  Patient: Suzanne [name]  Procedure(s) Performed: ESOPHAGOGASTRODUODENOSCOPY (EGD) WITH PROPOFOL     Patient location during evaluation: PACU Anesthesia Type: MAC Level of consciousness: awake and alert Pain management: pain level controlled Vital Signs Assessment: post-procedure vital signs reviewed and stable Respiratory status: spontaneous breathing, nonlabored ventilation and respiratory function stable Cardiovascular status: blood pressure returned to baseline and stable Postop Assessment: no apparent nausea or vomiting Anesthetic complications: no   No notable events documented.  Last Vitals:  Vitals:   05/27/22 1440 05/27/22 1450  BP: 97/63 106/61  Pulse: 88 88  Resp: 20 (!) 23  Temp:    SpO2: 100% 100%    Last Pain:  Vitals:   05/27/22 1450  TempSrc:   PainSc: 0-No pain                 Lidia Collum

## 2022-05-27 NOTE — ED Notes (Signed)
Blood consent electronically signed.

## 2022-05-28 DIAGNOSIS — E669 Obesity, unspecified: Secondary | ICD-10-CM

## 2022-05-28 DIAGNOSIS — C7951 Secondary malignant neoplasm of bone: Secondary | ICD-10-CM

## 2022-05-28 DIAGNOSIS — C7801 Secondary malignant neoplasm of right lung: Secondary | ICD-10-CM | POA: Diagnosis not present

## 2022-05-28 DIAGNOSIS — K921 Melena: Secondary | ICD-10-CM | POA: Diagnosis not present

## 2022-05-28 DIAGNOSIS — R6510 Systemic inflammatory response syndrome (SIRS) of non-infectious origin without acute organ dysfunction: Secondary | ICD-10-CM | POA: Diagnosis not present

## 2022-05-28 DIAGNOSIS — S22000A Wedge compression fracture of unspecified thoracic vertebra, initial encounter for closed fracture: Secondary | ICD-10-CM | POA: Diagnosis not present

## 2022-05-28 DIAGNOSIS — J309 Allergic rhinitis, unspecified: Secondary | ICD-10-CM | POA: Diagnosis not present

## 2022-05-28 DIAGNOSIS — I10 Essential (primary) hypertension: Secondary | ICD-10-CM

## 2022-05-28 DIAGNOSIS — K922 Gastrointestinal hemorrhage, unspecified: Secondary | ICD-10-CM | POA: Diagnosis not present

## 2022-05-28 DIAGNOSIS — J90 Pleural effusion, not elsewhere classified: Secondary | ICD-10-CM | POA: Diagnosis not present

## 2022-05-28 DIAGNOSIS — R0602 Shortness of breath: Secondary | ICD-10-CM | POA: Diagnosis not present

## 2022-05-28 DIAGNOSIS — K254 Chronic or unspecified gastric ulcer with hemorrhage: Secondary | ICD-10-CM | POA: Diagnosis not present

## 2022-05-28 DIAGNOSIS — D62 Acute posthemorrhagic anemia: Secondary | ICD-10-CM | POA: Diagnosis not present

## 2022-05-28 DIAGNOSIS — J9621 Acute and chronic respiratory failure with hypoxia: Secondary | ICD-10-CM | POA: Diagnosis not present

## 2022-05-28 DIAGNOSIS — N19 Unspecified kidney failure: Secondary | ICD-10-CM | POA: Diagnosis not present

## 2022-05-28 DIAGNOSIS — K920 Hematemesis: Secondary | ICD-10-CM | POA: Diagnosis not present

## 2022-05-28 DIAGNOSIS — J40 Bronchitis, not specified as acute or chronic: Secondary | ICD-10-CM | POA: Diagnosis not present

## 2022-05-28 LAB — URINE CULTURE

## 2022-05-28 LAB — CBC
HCT: 22.2 % — ABNORMAL LOW (ref 36.0–46.0)
Hemoglobin: 6.4 g/dL — CL (ref 12.0–15.0)
MCH: 27 pg (ref 26.0–34.0)
MCHC: 28.8 g/dL — ABNORMAL LOW (ref 30.0–36.0)
MCV: 93.7 fL (ref 80.0–100.0)
Platelets: 254 10*3/uL (ref 150–400)
RBC: 2.37 MIL/uL — ABNORMAL LOW (ref 3.87–5.11)
RDW: 18.1 % — ABNORMAL HIGH (ref 11.5–15.5)
WBC: 8.8 10*3/uL (ref 4.0–10.5)
nRBC: 0.2 % (ref 0.0–0.2)

## 2022-05-28 LAB — COMPREHENSIVE METABOLIC PANEL
ALT: 15 U/L (ref 0–44)
AST: 24 U/L (ref 15–41)
Albumin: 2.7 g/dL — ABNORMAL LOW (ref 3.5–5.0)
Alkaline Phosphatase: 71 U/L (ref 38–126)
Anion gap: 6 (ref 5–15)
BUN: 20 mg/dL (ref 8–23)
CO2: 22 mmol/L (ref 22–32)
Calcium: 7.8 mg/dL — ABNORMAL LOW (ref 8.9–10.3)
Chloride: 108 mmol/L (ref 98–111)
Creatinine, Ser: 0.86 mg/dL (ref 0.44–1.00)
GFR, Estimated: >60 mL/min (ref >60)
Glucose, Bld: 96 mg/dL (ref 70–99)
Potassium: 3.5 mmol/L (ref 3.5–5.1)
Sodium: 136 mmol/L (ref 135–145)
Total Bilirubin: 0.6 mg/dL (ref 0.3–1.2)
Total Protein: 5.5 g/dL — ABNORMAL LOW (ref 6.5–8.1)

## 2022-05-28 LAB — HEMOGLOBIN AND HEMATOCRIT, BLOOD
HCT: 25.7 % — ABNORMAL LOW (ref 36.0–46.0)
HCT: 28.8 % — ABNORMAL LOW (ref 36.0–46.0)
Hemoglobin: 7.7 g/dL — ABNORMAL LOW (ref 12.0–15.0)
Hemoglobin: 8.8 g/dL — ABNORMAL LOW (ref 12.0–15.0)

## 2022-05-28 LAB — PREPARE RBC (CROSSMATCH)

## 2022-05-28 MED ORDER — SODIUM CHLORIDE 0.9% IV SOLUTION: Freq: Once | INTRAVENOUS | Status: AC

## 2022-05-28 MED ORDER — FUROSEMIDE 10 MG/ML IJ SOLN
20.0000 mg | Freq: Once | INTRAMUSCULAR | Status: AC
  Administered 2022-05-28: 20 mg via INTRAVENOUS
  Filled 2022-05-28: qty 2

## 2022-05-28 NOTE — Progress Notes (Signed)
Subjective: Some dark stools overnight.  Objective: Vital signs in last 24 hours: Temp:  [98 F (36.7 C)-99.2 F (37.3 C)] 98.6 F (37 C) (01/01 1156) Pulse Rate:  [81-100] 81 (01/01 1156) Resp:  [16-24] 16 (01/01 1156) BP: (95-137)/(41-72) 117/64 (01/01 1156) SpO2:  [100 %] 100 % (01/01 1156) Weight:  [85.5 kg] 85.5 kg (12/31 1737) Weight change:  Last BM Date : 05/29/22  PE: GEN:  NAD, chronically ill-appearing, overweight, pale NEURO:  No encephalopathy ABD:  Soft, non-tender  Lab Results:  CBC    Component Value Date/Time   WBC 8.8 05/28/2022 0401   RBC 2.37 (L) 05/28/2022 0401   HGB 7.7 (L) 05/28/2022 1204   HGB 8.3 (L) 04/24/2022 1249   HCT 25.7 (L) 05/28/2022 1204   PLT 254 05/28/2022 0401   PLT 365 04/24/2022 1249   MCV 93.7 05/28/2022 0401   MCH 27.0 05/28/2022 0401   MCHC 28.8 (L) 05/28/2022 0401   RDW 18.1 (H) 05/28/2022 0401   LYMPHSABS 0.9 05/27/2022 0750   MONOABS 0.4 05/27/2022 0750   EOSABS 0.3 05/27/2022 0750   BASOSABS 0.1 05/27/2022 0750  CMP     Component Value Date/Time   NA 136 05/28/2022 0401   K 3.5 05/28/2022 0401   CL 108 05/28/2022 0401   CO2 22 05/28/2022 0401   GLUCOSE 96 05/28/2022 0401   BUN 20 05/28/2022 0401   CREATININE 0.86 05/28/2022 0401   CREATININE 0.87 04/24/2022 1249   CALCIUM 7.8 (L) 05/28/2022 0401   PROT 5.5 (L) 05/28/2022 0401   ALBUMIN 2.7 (L) 05/28/2022 0401   AST 24 05/28/2022 0401   AST 30 04/24/2022 1249   ALT 15 05/28/2022 0401   ALT 22 04/24/2022 1249   ALKPHOS 71 05/28/2022 0401   BILITOT 0.6 05/28/2022 0401   BILITOT 0.4 04/24/2022 1249   GFRNONAA >60 05/28/2022 0401   GFRNONAA >60 04/24/2022 1249   GFRAA >60 02/24/2020 1454   GFRAA >60 07/24/2019 1453   Assessment:   Coffee ground and red emesis, with melena.   Metastatic breast cancer (to liver, to bones) Anemia, at least partial acute blood loss component. Endoscopy yesterday with large hiatal hernia, multiple erosions/ulcerations in  hiatal hernia sac as well as cratered stomach along lesser curve fundus.  Some retained food/clot obscured some views of proximal stomach.  Plan:   Given drop in Hgb and further melena and further transfusion, would continue to monitor. CBCs, transfuse if needed. PPI gtt. Continue clear liquids. If continues melena and drop Hgb, would need repeat EGD. [facility] will follow.   Landry Dyke 05/28/2022, 1:34 PM   Cell [phone] If no answer or after 5 PM call [phone]

## 2022-05-28 NOTE — Evaluation (Signed)
Physical Therapy Evaluation-1x Patient Details Name: Suzanne [name] MRN: [id number] DOB: [birth year] Today's Date: 05/28/2022  History of Present Illness  62 yo female admitted with GI bleed, anemia. Hx of T12 comp fx, stage IV breast ca with mets, L UE ORIF 12/2018 due to pathological fx, HF  Clinical Impression  On eval, pt was Mod Ind with mobility. She walked a short distance around the room without a device while on O2 (she is O2 dep at baseline). O2 96% on 2L. Pt reports she has been assisting herself on/off the bsc. Discussed d/c plan-pt plans to return home where she lives with her daughter. Pt denies any further need for PT services. Will defer further mobility to mobility team and/or nursing.  1x eval. Will sign off.      Recommendations for follow up therapy are one component of a multi-disciplinary discharge planning process, led by the attending physician.  Recommendations may be updated based on patient status, additional functional criteria and insurance authorization.  Follow Up Recommendations No PT follow up      Assistance Recommended at Discharge PRN  Patient can return home with the following       Equipment Recommendations None recommended by PT  Recommendations for Other Services       Functional Status Assessment Patient has had a recent decline in their functional status and demonstrates the ability to make significant improvements in function in a reasonable and predictable amount of time.     Precautions / Restrictions Precautions Precaution Comments: O2 dep at baseline Restrictions Weight Bearing Restrictions: No      Mobility  Bed Mobility Overal bed mobility: Modified Independent                  Transfers Overall transfer level: Modified independent                      Ambulation/Gait Ambulation/Gait assistance: Modified independent (Device/Increase time) Gait Distance (Feet): 25 Feet Assistive device: None Gait  Pattern/deviations: Decreased stride length       General Gait Details: Pt walked short distance around room-limited by IV pole battery low. Remained on [place] O2 96% 2L with activity.  Stairs            Wheelchair Mobility    Modified Rankin (Stroke Patients Only)       Balance Overall balance assessment: No apparent balance deficits (not formally assessed)                                           Pertinent Vitals/Pain Pain Assessment Pain Assessment: No/denies pain    Home Living Family/patient expects to be discharged to:: Private residence Living Arrangements: Children Available Help at Discharge: Family             Home Equipment: Conservation officer, nature (2 wheels);BSC/3in1;Cane - single point;Hospital bed      Prior Function Prior Level of Function : Independent/Modified Independent             Mobility Comments: mod ind ADLs Comments: m-f 2 hours/day bathing, light housekeeping, meals; SCAT vs uber; O2 dep     Hand Dominance        Extremity/Trunk Assessment   Upper Extremity Assessment Upper Extremity Assessment: Overall WFL for tasks assessed    Lower Extremity Assessment Lower Extremity Assessment: Generalized weakness    Cervical / Trunk Assessment  Cervical / Trunk Assessment: Normal  Communication   Communication: No difficulties  Cognition Arousal/Alertness: Awake/alert Behavior During Therapy: WFL for tasks assessed/performed Overall Cognitive Status: Within Functional Limits for tasks assessed                                          General Comments      Exercises     Assessment/Plan    PT Assessment Patient does not need any further PT services  PT Problem List         PT Treatment Interventions      PT Goals (Current goals can be found in the Care Plan section)  Acute Rehab PT Goals Patient Stated Goal: home soon PT Goal Formulation: All assessment and education complete, DC  therapy    Frequency       Co-evaluation               AM-PAC PT "6 Clicks" Mobility  Outcome Measure Help needed turning from your back to your side while in a flat bed without using bedrails?: None Help needed moving from lying on your back to sitting on the side of a flat bed without using bedrails?: None Help needed moving to and from a bed to a chair (including a wheelchair)?: None Help needed standing up from a chair using your arms (e.g., wheelchair or bedside chair)?: None Help needed to walk in hospital room?: None Help needed climbing 3-5 steps with a railing? : A Little 6 Click Score: 23    End of Session Equipment Utilized During Treatment: Oxygen Activity Tolerance: Patient tolerated treatment well Patient left: in bed;with call bell/phone within reach        Time: 9292-4462 PT Time Calculation (min) (ACUTE ONLY): 15 min   Charges:   PT Evaluation $PT Eval Low Complexity: 1 Low             Doreatha Massed, PT Acute Rehabilitation  Office: [phone]

## 2022-05-28 NOTE — Progress Notes (Signed)
[facility]                                                                               Suzanne [name], is a 62 y.o. female, DOB - 09-Dec-1960, EHM:[id number] Admit date - 05/27/2022    Outpatient Primary MD for the patient is [name], NP  LOS - 1  days    Brief summary    Suzanne [name] is a 62 y.o. 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  GI consulted, and she underwent EGD showing a large hiatal hernia, erosive gastropathy, non bleeding gastric ulcers, . She reports having a melanotic stool this morning. Hemoglobin dropped to 6.4 this morning, could be from hemodilution vs continuous blood loss.   Assessment & Plan    Assessment and Plan:   Acute anemia of blood loss:  possibly  upper GI bleed.  S/P egd showing the a large hiatal hernia, erosive gastropathy, non bleeding gastric ulcers. Transfuse to keep hemoglobin greater than 7.  Appreciate GI input.  She had another melanotic stool this am.  ? Colonoscopy / video capsule endoscopy? Continue with IV PPI.     Malignant breast cancer with mets to bone.  Recommend outpatient follow up with oncology.    Bilateral pleural effusions Stop IV fluids, 1 dose of IV Lasix 20 mg ordered.    Essential hypertension Blood pressure currently    Body mass index is 39.4 kg/m. Obesity.  Monitor.     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  - pain control.    Chronic respiratory failure requiring 2 lit/min of Oxygen.         Estimated body mass index is 39.4 kg/m as calculated from the following:   Height as of this encounter: '4\' 10"'$   (1.473 m).   Weight as of this encounter: 85.5 kg.  Code Status: full code DVT Prophylaxis:  SCDs Start: 05/27/22 1146   Level of Care: Level of care: Med-Surg Family Communication: NONE AT BEDSIDE.   Disposition Plan:     Remains inpatient appropriate:  ANEMIA.   Procedures:  EGD  Consultants:   Gastroenterology.   Antimicrobials:   Anti-infectives (From admission, onward)    None        Medications  Scheduled Meds:  furosemide  20 mg Intravenous Once   influenza vac split quadrivalent PF  0.5 mL Intramuscular Tomorrow-1000   [START ON 05/30/2022] pantoprazole  40 mg Intravenous Q12H   sertraline  75 mg Oral QHS   Continuous Infusions:  pantoprazole 8 mg/hr (05/28/22 0959)   PRN Meds:.acetaminophen **OR** acetaminophen, ipratropium-albuterol, ondansetron **OR** ondansetron (ZOFRAN) IV, mouth rinse    Subjective:   Suzanne [name] was seen and examined today.  Reports having a black tarry stool this morning. Suspect from old blood.   Objective:   Vitals:   05/28/22 0649 05/28/22 0708 05/28/22 0925 05/28/22 1156  BP: Marland Kitchen)  106/41 132/69 137/72 117/64  Pulse: 86 83  81  Resp: (!) 21 (!) '22 20 16  '$ Temp: 98.3 F (36.8 C) 98.2 F (36.8 C) 98.7 F (37.1 C) 98.6 F (37 C)  TempSrc: Oral Oral Oral Oral  SpO2: 100% 100% 100% 100%  Weight:      Height:        Intake/Output Summary (Last 24 hours) at 05/28/2022 1321 Last data filed at 05/28/2022 0925 Gross per 24 hour  Intake 2585.98 ml  Output --  Net 2585.98 ml   Filed Weights   05/27/22 1737  Weight: 85.5 kg     Exam General exam: Appears calm and comfortable  Respiratory system: diminished air entry at bases. On 2 lit of [place] oxygen.  Cardiovascular system: S1 & S2 heard, RRR. No JVD, No pedal edema. Gastrointestinal system: Abdomen is nondistended, soft and nontender.  Central nervous system: Alert and oriented. No focal neurological deficits. Extremities: Symmetric 5 x 5 power. Skin: No rashes,  lesions or ulcers Psychiatry:  Mood & affect appropriate.     Data Reviewed:  I have personally reviewed following labs and imaging studies   CBC Lab Results  Component Value Date   WBC 8.8 05/28/2022   RBC 2.37 (L) 05/28/2022   HGB 7.7 (L) 05/28/2022   HCT 25.7 (L) 05/28/2022   MCV 93.7 05/28/2022   MCH 27.0 05/28/2022   PLT 254 05/28/2022   MCHC 28.8 (L) 05/28/2022   RDW 18.1 (H) 05/28/2022   LYMPHSABS 0.9 05/27/2022   MONOABS 0.4 05/27/2022   EOSABS 0.3 05/27/2022   BASOSABS 0.1 40/97/3532     Last metabolic panel Lab Results  Component Value Date   NA 136 05/28/2022   K 3.5 05/28/2022   CL 108 05/28/2022   CO2 22 05/28/2022   BUN 20 05/28/2022   CREATININE 0.86 05/28/2022   GLUCOSE 96 05/28/2022   GFRNONAA >60 05/28/2022   GFRAA >60 02/24/2020   CALCIUM 7.8 (L) 05/28/2022   PHOS 2.9 05/27/2022   PROT 5.5 (L) 05/28/2022   ALBUMIN 2.7 (L) 05/28/2022   BILITOT 0.6 05/28/2022   ALKPHOS 71 05/28/2022   AST 24 05/28/2022   ALT 15 05/28/2022   ANIONGAP 6 05/28/2022    CBG (last 3)  No results for input(s): "GLUCAP" in the last 72 hours.    Coagulation Profile: No results for input(s): "INR", "PROTIME" in the last 168 hours.   Radiology Studies: CT ANGIO GI BLEED  Result Date: 05/27/2022 CLINICAL DATA:  Diffuse abdominal pain with dark stools and coffee-ground emesis. Symptomatic anemia. History of metastatic breast cancer EXAM: CTA ABDOMEN AND PELVIS WITHOUT AND WITH CONTRAST TECHNIQUE: Multidetector CT imaging of the abdomen and pelvis was performed using the standard protocol during bolus administration of intravenous contrast. Multiplanar reconstructed images and MIPs were obtained and reviewed to evaluate the vascular anatomy. RADIATION DOSE REDUCTION: This exam was performed according to the departmental dose-optimization program which includes automated exposure control, adjustment of the mA and/or kV according to patient size and/or use of iterative  reconstruction technique. CONTRAST:  16m OMNIPAQUE IOHEXOL 350 MG/ML SOLN COMPARISON:  CT 03/05/2022 FINDINGS: VASCULAR Aorta: Normal caliber aorta without aneurysm, dissection, vasculitis or significant stenosis. Scattered atherosclerotic calcification. Celiac: Patent without evidence of aneurysm, dissection, vasculitis or significant stenosis. SMA: Patent without evidence of aneurysm, dissection, vasculitis or significant stenosis. Renals: Both renal arteries are patent without evidence of aneurysm, dissection, vasculitis, fibromuscular dysplasia or significant stenosis. IMA: Patent. Inflow: Patent without evidence of aneurysm, dissection,  vasculitis or significant stenosis. Proximal Outflow: Bilateral common femoral and visualized portions of the superficial and profunda femoral arteries are patent without evidence of aneurysm, dissection, vasculitis or significant stenosis. Veins: Major venous structures including the portal vein and IVC are patent. Review of the MIP images confirms the above findings. NON-VASCULAR Lower chest: Chronic elevation of the right hemidiaphragm with right basilar atelectasis. Mosaic attenuation of the lung fields. Subtle nodularity along the pleural surfaces of the lung bases is suspicious for metastatic disease. No pleural effusions. Hepatobiliary: Diffuse hepatic metastatic disease. Several of the hepatic masses appear increased in size compared to 03/05/2022. Multiple stones within the gallbladder. No pericholecystic inflammatory changes by CT. Pancreas: Unremarkable. No pancreatic ductal dilatation or surrounding inflammatory changes. Spleen: Normal in size without focal abnormality. Adrenals/Urinary Tract: Unremarkable adrenal glands. Limited assessment for renal calculi given the presence of excreted contrast in the renal collecting systems on pre contrast imaging. No renal lesion or hydronephrosis. Stomach/Bowel: Moderate-large hiatal hernia. No dilated loops of bowel. Normal  appendix in the right lower quadrant. Mild scattered diverticulosis. No focal bowel wall thickening or inflammatory changes. No abnormal intraluminal contrast accumulation on multiphasic images to suggest active GI bleed by CT. Lymphatic: No abdominopelvic lymphadenopathy. Reproductive: Uterus and bilateral adnexa are unremarkable. Other: No free fluid. No abdominopelvic fluid collection. No pneumoperitoneum. No abdominal wall hernia. Musculoskeletal: Widespread mixed lytic and sclerotic lesions throughout the skeleton. Unchanged focal kyphosis at the thoracolumbar junction where there are associated chronic compression deformities. Multiple healed bilateral rib fractures. No new pathologic fractures are identified. IMPRESSION: 1. No evidence of active GI bleed by CT. 2. No acute abdominopelvic findings. 3. Diffuse hepatic metastatic disease. Several of the hepatic masses appear increased in size compared to 03/05/2022. 4. Subtle nodularity along the pleural surfaces of the lung bases is suspicious for metastatic disease. 5. Widespread osseous metastatic disease. No new pathologic fractures are identified. 6. Moderate-large hiatal hernia. 7. Cholelithiasis. Electronically Signed   By: Davina Poke D.O.   On: 05/27/2022 10:26   DG Chest Portable 1 View  Result Date: 05/27/2022 CLINICAL DATA:  62 year old female with history of exertional shortness of breath and chest pressure. EXAM: PORTABLE CHEST 1 VIEW COMPARISON:  Chest x-ray 04/17/2022. FINDINGS: Chronic severe elevation of the right hemidiaphragm is again noted with associated areas of passive subsegmental atelectasis and/or scarring in the right lung base. Left lung is clear. No pleural effusions. No pneumothorax. No evidence of pulmonary edema. Heart size is normal. Upper mediastinal contours are within normal limits. Atherosclerotic calcifications in the thoracic aorta. Large hiatal hernia. IMPRESSION: 1. Radiographic appearance the chest is  unchanged, with no definitive evidence to suggest acute cardiopulmonary disease. 2. Aortic atherosclerosis. 3. Large hiatal hernia. Electronically Signed   By: Vinnie Langton M.D.   On: 05/27/2022 08:28       Hosie Poisson M.D. [facility] 05/28/2022, 1:21 PM  Available via Epic secure chat 7am-7pm After 7 pm, please refer to night coverage provider listed on amion.

## 2022-05-29 ENCOUNTER — Inpatient Hospital Stay: Payer: Medicaid Other

## 2022-05-29 ENCOUNTER — Inpatient Hospital Stay: Payer: Medicaid Other | Admitting: Adult Health

## 2022-05-29 DIAGNOSIS — K922 Gastrointestinal hemorrhage, unspecified: Secondary | ICD-10-CM | POA: Diagnosis not present

## 2022-05-29 DIAGNOSIS — J90 Pleural effusion, not elsewhere classified: Secondary | ICD-10-CM | POA: Diagnosis not present

## 2022-05-29 DIAGNOSIS — K254 Chronic or unspecified gastric ulcer with hemorrhage: Secondary | ICD-10-CM | POA: Diagnosis not present

## 2022-05-29 DIAGNOSIS — D62 Acute posthemorrhagic anemia: Secondary | ICD-10-CM | POA: Diagnosis not present

## 2022-05-29 DIAGNOSIS — K920 Hematemesis: Secondary | ICD-10-CM | POA: Diagnosis not present

## 2022-05-29 DIAGNOSIS — E669 Obesity, unspecified: Secondary | ICD-10-CM | POA: Diagnosis not present

## 2022-05-29 DIAGNOSIS — K921 Melena: Secondary | ICD-10-CM | POA: Diagnosis not present

## 2022-05-29 DIAGNOSIS — C7951 Secondary malignant neoplasm of bone: Secondary | ICD-10-CM | POA: Diagnosis not present

## 2022-05-29 DIAGNOSIS — I10 Essential (primary) hypertension: Secondary | ICD-10-CM | POA: Diagnosis not present

## 2022-05-29 LAB — BASIC METABOLIC PANEL
Anion gap: 6 (ref 5–15)
BUN: 8 mg/dL (ref 8–23)
CO2: 26 mmol/L (ref 22–32)
Calcium: 7.6 mg/dL — ABNORMAL LOW (ref 8.9–10.3)
Chloride: 105 mmol/L (ref 98–111)
Creatinine, Ser: 0.85 mg/dL (ref 0.44–1.00)
GFR, Estimated: >60 mL/min (ref >60)
Glucose, Bld: 99 mg/dL (ref 70–99)
Potassium: 3.5 mmol/L (ref 3.5–5.1)
Sodium: 137 mmol/L (ref 135–145)

## 2022-05-29 LAB — BPAM RBC
Blood Product Expiration Date: 202401282359
Blood Product Expiration Date: 202401292359
ISSUE DATE / TIME: 202312311046
ISSUE DATE / TIME: 202401010648
Unit Type and Rh: 5100
Unit Type and Rh: 5100

## 2022-05-29 LAB — TYPE AND SCREEN
ABO/RH(D): O POS
Antibody Screen: NEGATIVE
Unit division: 0
Unit division: 0

## 2022-05-29 LAB — HEMOGLOBIN AND HEMATOCRIT, BLOOD
HCT: 25.7 % — ABNORMAL LOW (ref 36.0–46.0)
HCT: 26.1 % — ABNORMAL LOW (ref 36.0–46.0)
Hemoglobin: 7.6 g/dL — ABNORMAL LOW (ref 12.0–15.0)
Hemoglobin: 7.8 g/dL — ABNORMAL LOW (ref 12.0–15.0)

## 2022-05-29 MED ORDER — LOPERAMIDE HCL 2 MG PO CAPS: 2.0000 mg | ORAL_CAPSULE | ORAL | Status: DC | PRN

## 2022-05-29 MED ORDER — PANTOPRAZOLE SODIUM 40 MG PO TBEC
40.0000 mg | DELAYED_RELEASE_TABLET | Freq: Two times a day (BID) | ORAL | Status: DC
  Administered 2022-05-29 – 2022-05-30 (×2): 40 mg via ORAL
  Filled 2022-05-29 (×2): qty 1

## 2022-05-29 NOTE — Progress Notes (Signed)
[facility] Gastroenterology Progress Note  Suzanne [name] 62 y.o. 01/17/1961   Subjective: Denies melena overnight or today. Feels ok. Hungry.  Objective: Vital signs: Vitals:   05/29/22 1022 05/29/22 1319  BP: 119/66 129/73  Pulse: 83 81  Resp: 16 18  Temp: 98.3 F (36.8 C) 99.1 F (37.3 C)  SpO2: 100% 100%    Physical Exam: Gen: lethargic, no acute distress, obese HEENT: anicteric sclera CV: RRR Chest: CTA B Abd: soft, nontender, nondistended, +BS Ext: no edema  Lab Results: Recent Labs    05/27/22 1701 05/28/22 0401 05/29/22 0650  NA  --  136 137  K  --  3.5 3.5  CL  --  108 105  CO2  --  22 26  GLUCOSE  --  96 99  BUN  --  20 8  CREATININE  --  0.86 0.85  CALCIUM  --  7.8* 7.6*  MG 1.9  --   --   PHOS 2.9  --   --    Recent Labs    05/27/22 0750 05/28/22 0401  AST 25 24  ALT 17 15  ALKPHOS 84 71  BILITOT 0.6 0.6  PROT 6.0* 5.5*  ALBUMIN 2.8* 2.7*   Recent Labs    05/27/22 0750 05/27/22 1701 05/28/22 0401 05/28/22 1204 05/28/22 1744 05/29/22 0650  WBC 8.3  --  8.8  --   --   --   NEUTROABS 6.6  --   --   --   --   --   HGB 6.3*   < > 6.4*   < > 8.8* 7.6*  HCT 23.2*   < > 22.2*   < > 28.8* 25.7*  MCV 94.3  --  93.7  --   --   --   PLT 310  --  254  --   --   --    < > = values in this interval not displayed.      Assessment/Plan: GI bleed in the setting of met breast cancer - Hgb 7.6 (8.8) without any overt bleeding overnight. EGD with proximal ulcerations and large hiatal hernia. Continue clear liquid diet. Consider advancing diet tomorrow if stable. Protonix drip. Supportive care.   Lear Ng 05/29/2022, 2:42 PM  Questions please call [phone] ID: Suzanne [name], female   DOB: 18-Apr-1961, 61 y.o.   MRN: [id number]

## 2022-05-29 NOTE — Progress Notes (Signed)
Mobility Specialist - Progress Note   05/29/22 1227  Mobility  Activity Ambulated with assistance in hallway  Level of Assistance Standby assist, set-up cues, supervision of patient - no hands on  Assistive Device Front wheel walker  Distance Ambulated (ft) 250 ft  Activity Response Tolerated well  Mobility Referral Yes  $Mobility charge 1 Mobility   Pt received in bed and agreeable to mobility. C/o arm swelling near IV, nurse notified. Pt to bed after session with all needs met & call bell in reach.  [facility], [place]

## 2022-05-29 NOTE — Progress Notes (Signed)
[facility]                                                                               Suzanne [name], is a 62 y.o. female, DOB - 04/28/1961, YKD:[id number] Admit date - 05/27/2022    Outpatient Primary MD for the patient is [name], NP  LOS - 2  days    Brief summary    Suzanne [name] is a 62 y.o. 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  GI consulted, and she underwent EGD showing a large hiatal hernia, erosive gastropathy, non bleeding gastric ulcers, . She reports having a melanotic stool this morning. Hemoglobin dropped to 6.4 this morning, could be from hemodilution vs continuous blood loss.   Assessment & Plan    Assessment and Plan:   Acute anemia of blood loss:  possibly  upper GI bleed.  S/P egd showing the a large hiatal hernia, erosive gastropathy, non bleeding gastric ulcers. Transfuse to keep hemoglobin greater than 7.  Appreciate GI input.  ? Colonoscopy / video capsule endoscopy?.  Hemoglobin around 7.6 today.  Recheck hemoglobin in am.      Malignant breast cancer with mets to bone.  Recommend outpatient follow up with oncology.    Bilateral pleural effusions Continue to monitor.     Essential hypertension Blood pressure currently  optimal.     Body mass index is 39.4 kg/m. Obesity.  Monitor.     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  - pain control.    Chronic respiratory failure requiring 2 lit/min of Oxygen.         Estimated body mass index is 39.4 kg/m as calculated from the following:   Height as of this encounter: '4\' 10"'$  (1.473 m).   Weight as  of this encounter: 85.5 kg.  Code Status: full code DVT Prophylaxis:  SCDs Start: 05/27/22 1146   Level of Care: Level of care: Med-Surg Family Communication: NONE AT BEDSIDE.   Disposition Plan:     Remains inpatient appropriate:  ANEMIA.   Procedures:  EGD  Consultants:   Gastroenterology.   Antimicrobials:   Anti-infectives (From admission, onward)    None        Medications  Scheduled Meds:  influenza vac split quadrivalent PF  0.5 mL Intramuscular Tomorrow-1000   pantoprazole  40 mg Oral BID   sertraline  75 mg Oral QHS   Continuous Infusions:   PRN Meds:.acetaminophen **OR** acetaminophen, ipratropium-albuterol, loperamide, ondansetron **OR** ondansetron (ZOFRAN) IV, mouth rinse    Subjective:   Suzanne [name] was seen and examined today.  Not having any melanotic stools.  No nausea, vomiting or abdominal pain.   Objective:   Vitals:   05/29/22 1022 05/29/22 1319 05/29/22 1726 05/29/22 1816  BP: 119/66 129/73 (!) 124/54   Pulse: 83 81 93   Resp: '16 18 18   '$ Temp: 98.3 F (  36.8 C) 99.1 F (37.3 C) 99 F (37.2 C) 97.7 F (36.5 C)  TempSrc:      SpO2: 100% 100% 98%   Weight:      Height:        Intake/Output Summary (Last 24 hours) at 05/29/2022 1825 Last data filed at 05/29/2022 0600 Gross per 24 hour  Intake 362.43 ml  Output --  Net 362.43 ml    Filed Weights   05/27/22 1737  Weight: 85.5 kg     Exam General exam: Appears calm and comfortable  Respiratory system: diminished air entry at bases, on 2lit of [place] oxygen.  Cardiovascular system: S1 & S2 heard, RRR. No JVD,  No pedal edema. Gastrointestinal system: Abdomen is nondistended, soft and nontender.  Central nervous system: Alert and oriented. No focal neurological deficits. Extremities: Symmetric 5 x 5 power. Skin: No rashes, lesions or ulcers Psychiatry: Mood & affect appropriate.      Data Reviewed:  I have personally reviewed following labs and imaging  studies   CBC Lab Results  Component Value Date   WBC 8.8 05/28/2022   RBC 2.37 (L) 05/28/2022   HGB 7.6 (L) 05/29/2022   HCT 25.7 (L) 05/29/2022   MCV 93.7 05/28/2022   MCH 27.0 05/28/2022   PLT 254 05/28/2022   MCHC 28.8 (L) 05/28/2022   RDW 18.1 (H) 05/28/2022   LYMPHSABS 0.9 05/27/2022   MONOABS 0.4 05/27/2022   EOSABS 0.3 05/27/2022   BASOSABS 0.1 92/44/6286     Last metabolic panel Lab Results  Component Value Date   NA 137 05/29/2022   K 3.5 05/29/2022   CL 105 05/29/2022   CO2 26 05/29/2022   BUN 8 05/29/2022   CREATININE 0.85 05/29/2022   GLUCOSE 99 05/29/2022   GFRNONAA >60 05/29/2022   GFRAA >60 02/24/2020   CALCIUM 7.6 (L) 05/29/2022   PHOS 2.9 05/27/2022   PROT 5.5 (L) 05/28/2022   ALBUMIN 2.7 (L) 05/28/2022   BILITOT 0.6 05/28/2022   ALKPHOS 71 05/28/2022   AST 24 05/28/2022   ALT 15 05/28/2022   ANIONGAP 6 05/29/2022    CBG (last 3)  No results for input(s): "GLUCAP" in the last 72 hours.    Coagulation Profile: No results for input(s): "INR", "PROTIME" in the last 168 hours.   Radiology Studies: No results found.     Hosie Poisson M.D. [facility] 05/29/2022, 6:25 PM  Available via Epic secure chat 7am-7pm After 7 pm, please refer to night coverage provider listed on amion.

## 2022-05-29 NOTE — TOC CM/SW Note (Signed)
Transition of Care [facility]) Screening Note  Patient Details  Name: Suzanne [name] Date of Birth: [birth year]  Transition of Care [facility]) CM/SW Contact:    Sherie Don, LCSW Phone Number: 05/29/2022, 10:26 AM  Transition of Care Department [facility]) has reviewed patient and no TOC needs have been identified at this time. We will continue to monitor patient advancement through interdisciplinary progression rounds. If new patient transition needs arise, please place a TOC consult.

## 2022-05-30 DIAGNOSIS — I10 Essential (primary) hypertension: Secondary | ICD-10-CM | POA: Diagnosis not present

## 2022-05-30 DIAGNOSIS — C7951 Secondary malignant neoplasm of bone: Secondary | ICD-10-CM | POA: Diagnosis not present

## 2022-05-30 DIAGNOSIS — E669 Obesity, unspecified: Secondary | ICD-10-CM | POA: Diagnosis not present

## 2022-05-30 DIAGNOSIS — K921 Melena: Secondary | ICD-10-CM | POA: Diagnosis not present

## 2022-05-30 DIAGNOSIS — D62 Acute posthemorrhagic anemia: Secondary | ICD-10-CM | POA: Diagnosis not present

## 2022-05-30 DIAGNOSIS — K920 Hematemesis: Secondary | ICD-10-CM | POA: Diagnosis not present

## 2022-05-30 DIAGNOSIS — K922 Gastrointestinal hemorrhage, unspecified: Secondary | ICD-10-CM | POA: Diagnosis not present

## 2022-05-30 LAB — BASIC METABOLIC PANEL
Anion gap: 6 (ref 5–15)
BUN: 6 mg/dL — ABNORMAL LOW (ref 8–23)
CO2: 26 mmol/L (ref 22–32)
Calcium: 8 mg/dL — ABNORMAL LOW (ref 8.9–10.3)
Chloride: 107 mmol/L (ref 98–111)
Creatinine, Ser: 0.85 mg/dL (ref 0.44–1.00)
GFR, Estimated: >60 mL/min (ref >60)
Glucose, Bld: 99 mg/dL (ref 70–99)
Potassium: 3.8 mmol/L (ref 3.5–5.1)
Sodium: 139 mmol/L (ref 135–145)

## 2022-05-30 LAB — HEMOGLOBIN AND HEMATOCRIT, BLOOD
HCT: 27.6 % — ABNORMAL LOW (ref 36.0–46.0)
Hemoglobin: 8.3 g/dL — ABNORMAL LOW (ref 12.0–15.0)

## 2022-05-30 MED ORDER — MELATONIN 1 MG PO TABS
1.0000 mg | ORAL_TABLET | Freq: Every day | ORAL | Status: DC
  Filled 2022-05-30: qty 1

## 2022-05-30 MED ORDER — FOLIC ACID 1 MG PO TABS
1.0000 mg | ORAL_TABLET | Freq: Every day | ORAL | Status: DC
  Administered 2022-05-30: 1 mg via ORAL
  Filled 2022-05-30: qty 1

## 2022-05-30 MED ORDER — PANTOPRAZOLE SODIUM 40 MG PO TBEC: 40.0000 mg | DELAYED_RELEASE_TABLET | Freq: Two times a day (BID) | ORAL | 2 refills | Status: DC

## 2022-05-30 MED ORDER — TRIAMCINOLONE ACETONIDE 0.5 % EX OINT: 1.0000 | TOPICAL_OINTMENT | Freq: Every day | CUTANEOUS | Status: DC | PRN

## 2022-05-30 MED ORDER — MELATONIN 3 MG PO TABS: 1.5000 mg | ORAL_TABLET | Freq: Every day | ORAL | Status: DC

## 2022-05-30 NOTE — Plan of Care (Signed)
  Problem: Education: Goal: Knowledge of General Education information will improve Description: Including pain rating scale, medication(s)/side effects and non-pharmacologic comfort measures Outcome: Adequate for Discharge   Problem: Health Behavior/Discharge Planning: Goal: Ability to manage health-related needs will improve Outcome: Adequate for Discharge   Problem: Clinical Measurements: Goal: Ability to maintain clinical measurements within normal limits will improve Outcome: Adequate for Discharge Goal: Will remain free from infection Outcome: Adequate for Discharge Goal: Diagnostic test results will improve Outcome: Adequate for Discharge Goal: Respiratory complications will improve Outcome: Adequate for Discharge Goal: Cardiovascular complication will be avoided Outcome: Adequate for Discharge   Problem: Activity: Goal: Risk for activity intolerance will decrease 05/30/2022 1520 by Iver Nestle A, LPN Outcome: Adequate for Discharge 05/30/2022 1253 by Iver Nestle A, LPN Outcome: Progressing   Problem: Nutrition: Goal: Adequate nutrition will be maintained 05/30/2022 1520 by Iver Nestle A, LPN Outcome: Adequate for Discharge 05/30/2022 1253 by Iver Nestle A, LPN Outcome: Progressing   Problem: Coping: Goal: Level of anxiety will decrease 05/30/2022 1520 by Iver Nestle A, LPN Outcome: Adequate for Discharge 05/30/2022 1253 by Lise Auer, LPN Outcome: Progressing   Problem: Elimination: Goal: Will not experience complications related to bowel motility Outcome: Adequate for Discharge Goal: Will not experience complications related to urinary retention Outcome: Adequate for Discharge   Problem: Pain Managment: Goal: General experience of comfort will improve Outcome: Adequate for Discharge   Problem: Safety: Goal: Ability to remain free from injury will improve Outcome: Adequate for Discharge   Problem: Skin Integrity: Goal: Risk for impaired skin  integrity will decrease Outcome: Adequate for Discharge   Problem: Education: Goal: Ability to identify signs and symptoms of gastrointestinal bleeding will improve Outcome: Adequate for Discharge   Problem: Bowel/Gastric: Goal: Will show no signs and symptoms of gastrointestinal bleeding Outcome: Adequate for Discharge   Problem: Fluid Volume: Goal: Will show no signs and symptoms of excessive bleeding Outcome: Adequate for Discharge   Problem: Clinical Measurements: Goal: Complications related to the disease process, condition or treatment will be avoided or minimized Outcome: Adequate for Discharge

## 2022-05-30 NOTE — Plan of Care (Signed)
  Problem: Activity: Goal: Risk for activity intolerance will decrease Outcome: Progressing   Problem: Nutrition: Goal: Adequate nutrition will be maintained Outcome: Progressing   Problem: Coping: Goal: Level of anxiety will decrease Outcome: Progressing   

## 2022-05-30 NOTE — Progress Notes (Signed)
[facility] Gastroenterology Progress Note  Suzanne [name] 62 y.o. [birth year]   Subjective: Feels ok. Brown stool overnight. Denies abdominal pain. Happy she is on solid food now.  Objective: Vital signs: Vitals:   05/29/22 2159 05/30/22 0622  BP: (!) 124/57 114/61  Pulse: 84 81  Resp: 17 17  Temp: 98.4 F (36.9 C) 98.4 F (36.9 C)  SpO2: 100% 100%    Physical Exam: Gen: lethargic, obese, no acute distress, pleasant HEENT: anicteric sclera CV: RRR Chest: CTA B Abd: soft, nontender, nondistended, +BS, dry scaly plaques Ext: no edema  Lab Results: Recent Labs    05/27/22 1701 05/28/22 0401 05/29/22 0650 05/30/22 0733  NA  --    < > 137 139  K  --    < > 3.5 3.8  CL  --    < > 105 107  CO2  --    < > 26 26  GLUCOSE  --    < > 99 99  BUN  --    < > 8 6*  CREATININE  --    < > 0.85 0.85  CALCIUM  --    < > 7.6* 8.0*  MG 1.9  --   --   --   PHOS 2.9  --   --   --    < > = values in this interval not displayed.   Recent Labs    05/28/22 0401  AST 24  ALT 15  ALKPHOS 71  BILITOT 0.6  PROT 5.5*  ALBUMIN 2.7*   Recent Labs    05/28/22 0401 05/28/22 1204 05/29/22 1810 05/30/22 0733  WBC 8.8  --   --   --   HGB 6.4*   < > 7.8* 8.3*  HCT 22.2*   < > 26.1* 27.6*  MCV 93.7  --   --   --   PLT 254  --   --   --    < > = values in this interval not displayed.      Assessment/Plan: GI bleed due to proximal ulcerations in large hiatal hernia. No further bleeding. Hgb stable. Agree with soft diet. Needs to remain on PPI BID for 3 months and then QD. Ok to go home today from GI standpoint. F/U with [facility] in 4-6 weeks. Will sign off. Call if questions.   Lear Ng 05/30/2022, 10:54 AM  Questions please call [phone] ID: Suzanne [name], female   DOB: August 02, 1960, 62 y.o.   MRN: [id number]

## 2022-05-30 NOTE — TOC Transition Note (Signed)
Transition of Care [facility]) - CM/SW Discharge Note  Patient Details  Name: Suzanne [name] MRN: [id number] Date of Birth: 09-02-1960  Transition [facility]) CM/SW Contact:  Sherie Don, LCSW Phone Number: 05/30/2022, 4:08 PM  Clinical Narrative: Patient will require PTAR transport home as she is on 2L/min O2 at baseline and uses a wheelchair at home. Medical necessity form done; PTAR scheduled. TOC signing off.  Final next level of care: Home/Self Care Barriers to Discharge: No Barriers Identified  Discharge Plan and Services Additional resources added to the After Visit Summary for        DME Arranged: N/A DME Agency: NA  Social Determinants of Health (SDOH) Interventions [place]: No Food Insecurity (05/27/2022)  Housing: Low Risk  (05/27/2022)  Transportation Needs: No Transportation Needs (05/27/2022)  Utilities: Not At Risk (05/27/2022)  Depression (PHQ2-9): Low Risk  (01/10/2022)  Financial Resource Strain: Low Risk  (08/16/2021)  Social Connections: Socially Isolated (03/27/2021)  Stress: Stress Concern Present (11/08/2021)  Tobacco Use: Low Risk  (05/27/2022)   Readmission Risk Interventions    04/19/2022   10:53 AM  Readmission Risk Prevention Plan  Transportation Screening Complete  PCP or Specialist Appt within 5-7 Days Complete  Home Care Screening Complete  Medication Review (RN CM) Complete

## 2022-05-30 NOTE — Discharge Summary (Incomplete)
Physician Discharge Summary   Patient: Suzanne [name] MRN: [id number] DOB: 07/20/1960  Admit date:     05/27/2022  Discharge date: {dischdate:26783}  Discharge Physician: Hosie Poisson   PCP: [name], NP   Recommendations at discharge:  {Tip this will not be part of the note when signed- Example include specific recommendations for outpatient follow-up, pending tests to follow-up on. (Optional):26781}  ***  Discharge Diagnoses: Principal Problem:   UGI bleed Active Problems:   Malignant neoplasm metastatic to bone [facility])   Essential hypertension   Class 2 obesity   Pleural effusion, bilateral   Hypoalbuminemia  Resolved Problems:   * No resolved hospital problems. [facility] Course: No notes on file  Assessment and Plan: No notes have been filed under this hospital service. Service: Hospitalist     {Tip this will not be part of the note when signed Body mass index is 39.4 kg/m. , ,  (Optional):26781}  {(NOTE) Pain control PDMP Statment (Optional):26782} Consultants: *** Procedures performed: ***  Disposition: {Plan; Disposition:26390} Diet recommendation:  Discharge Diet Orders (From admission, onward)     Start     Ordered   05/30/22 0000  Diet - low sodium heart healthy        05/30/22 1420           {Diet_Plan:26776} DISCHARGE MEDICATION: Allergies as of 05/30/2022       Reactions   Ferumoxytol Other (See Comments)   Pt had hypersensitivity to Ferumoxytol. She had Facial Flushing. See note from 07/14/2021     Med Rec must be completed prior to using this [facility]***       Discharge Exam: Filed Weights   05/27/22 1737  Weight: 85.5 kg   ***  Condition at discharge: {DC Condition:26389}  The results of significant diagnostics from this hospitalization (including imaging, microbiology, ancillary and laboratory) are listed below for reference.   Imaging Studies: CT ANGIO GI BLEED  Result Date: 05/27/2022 CLINICAL DATA:   Diffuse abdominal pain with dark stools and coffee-ground emesis. Symptomatic anemia. History of metastatic breast cancer EXAM: CTA ABDOMEN AND PELVIS WITHOUT AND WITH CONTRAST TECHNIQUE: Multidetector CT imaging of the abdomen and pelvis was performed using the standard protocol during bolus administration of intravenous contrast. Multiplanar reconstructed images and MIPs were obtained and reviewed to evaluate the vascular anatomy. RADIATION DOSE REDUCTION: This exam was performed according to the departmental dose-optimization program which includes automated exposure control, adjustment of the mA and/or kV according to patient size and/or use of iterative reconstruction technique. CONTRAST:  158m OMNIPAQUE IOHEXOL 350 MG/ML SOLN COMPARISON:  CT 03/05/2022 FINDINGS: VASCULAR Aorta: Normal caliber aorta without aneurysm, dissection, vasculitis or significant stenosis. Scattered atherosclerotic calcification. Celiac: Patent without evidence of aneurysm, dissection, vasculitis or significant stenosis. SMA: Patent without evidence of aneurysm, dissection, vasculitis or significant stenosis. Renals: Both renal arteries are patent without evidence of aneurysm, dissection, vasculitis, fibromuscular dysplasia or significant stenosis. IMA: Patent. Inflow: Patent without evidence of aneurysm, dissection, vasculitis or significant stenosis. Proximal Outflow: Bilateral common femoral and visualized portions of the superficial and profunda femoral arteries are patent without evidence of aneurysm, dissection, vasculitis or significant stenosis. Veins: Major venous structures including the portal vein and IVC are patent. Review of the MIP images confirms the above findings. NON-VASCULAR Lower chest: Chronic elevation of the right hemidiaphragm with right basilar atelectasis. Mosaic attenuation of the lung fields. Subtle nodularity along the pleural surfaces of the lung bases is suspicious for metastatic disease. No pleural  effusions. Hepatobiliary:  Diffuse hepatic metastatic disease. Several of the hepatic masses appear increased in size compared to 03/05/2022. Multiple stones within the gallbladder. No pericholecystic inflammatory changes by CT. Pancreas: Unremarkable. No pancreatic ductal dilatation or surrounding inflammatory changes. Spleen: Normal in size without focal abnormality. Adrenals/Urinary Tract: Unremarkable adrenal glands. Limited assessment for renal calculi given the presence of excreted contrast in the renal collecting systems on pre contrast imaging. No renal lesion or hydronephrosis. Stomach/Bowel: Moderate-large hiatal hernia. No dilated loops of bowel. Normal appendix in the right lower quadrant. Mild scattered diverticulosis. No focal bowel wall thickening or inflammatory changes. No abnormal intraluminal contrast accumulation on multiphasic images to suggest active GI bleed by CT. Lymphatic: No abdominopelvic lymphadenopathy. Reproductive: Uterus and bilateral adnexa are unremarkable. Other: No free fluid. No abdominopelvic fluid collection. No pneumoperitoneum. No abdominal wall hernia. Musculoskeletal: Widespread mixed lytic and sclerotic lesions throughout the skeleton. Unchanged focal kyphosis at the thoracolumbar junction where there are associated chronic compression deformities. Multiple healed bilateral rib fractures. No new pathologic fractures are identified. IMPRESSION: 1. No evidence of active GI bleed by CT. 2. No acute abdominopelvic findings. 3. Diffuse hepatic metastatic disease. Several of the hepatic masses appear increased in size compared to 03/05/2022. 4. Subtle nodularity along the pleural surfaces of the lung bases is suspicious for metastatic disease. 5. Widespread osseous metastatic disease. No new pathologic fractures are identified. 6. Moderate-large hiatal hernia. 7. Cholelithiasis. Electronically Signed   By: Davina Poke D.O.   On: 05/27/2022 10:26   DG Chest Portable 1  View  Result Date: 05/27/2022 CLINICAL DATA:  62 year old female with history of exertional shortness of breath and chest pressure. EXAM: PORTABLE CHEST 1 VIEW COMPARISON:  Chest x-ray 04/17/2022. FINDINGS: Chronic severe elevation of the right hemidiaphragm is again noted with associated areas of passive subsegmental atelectasis and/or scarring in the right lung base. Left lung is clear. No pleural effusions. No pneumothorax. No evidence of pulmonary edema. Heart size is normal. Upper mediastinal contours are within normal limits. Atherosclerotic calcifications in the thoracic aorta. Large hiatal hernia. IMPRESSION: 1. Radiographic appearance the chest is unchanged, with no definitive evidence to suggest acute cardiopulmonary disease. 2. Aortic atherosclerosis. 3. Large hiatal hernia. Electronically Signed   By: Vinnie Langton M.D.   On: 05/27/2022 08:28    Microbiology: Results for orders placed or performed during the hospital encounter of 05/27/22  Urine Culture     Status: Abnormal   Collection Time: 05/27/22  7:33 AM   Specimen: Urine, Clean Catch  Result Value Ref Range Status   Specimen Description   Final    URINE, CLEAN CATCH Performed at [facility], [place] [address], [place], [place] [address]    Special Requests   Final    NONE Performed at [facility], [place] [address] [place],  [address]    Culture MULTIPLE SPECIES PRESENT, SUGGEST RECOLLECTION (A)  Final   Report Status 05/28/2022 FINAL  Final  Resp panel by RT-PCR (RSV, Flu A&B, Covid) Anterior Nasal Swab     Status: None   Collection Time: 05/27/22  7:56 AM   Specimen: Anterior Nasal Swab  Result Value Ref Range Status   SARS Coronavirus 2 by RT PCR NEGATIVE NEGATIVE Final    Comment: (NOTE) SARS-CoV-2 target nucleic acids are NOT DETECTED.  The SARS-CoV-2 RNA is generally detectable in upper respiratory specimens during the acute phase of infection. The  lowest concentration of SARS-CoV-2 viral copies this assay can detect is 138 copies/mL. A negative  result does not preclude SARS-Cov-2 infection and should not be used as the sole basis for treatment or other patient management decisions. A negative result may occur with  improper specimen collection/handling, submission of specimen other than nasopharyngeal swab, presence of viral mutation(s) within the areas targeted by this assay, and inadequate number of viral copies(<138 copies/mL). A negative result must be combined with clinical observations, patient history, and epidemiological information. The expected result is Negative.  Fact Sheet for Patients:  EntrepreneurPulse.com.au  Fact Sheet for Healthcare Providers:  IncredibleEmployment.be  This test is no t yet approved or cleared by the [place] FDA and  has been authorized for detection and/or diagnosis of SARS-CoV-2 by FDA under an Emergency Use Authorization (EUA). This EUA will remain  in effect (meaning this test can be used) for the duration of the COVID-19 declaration under Section 564(b)(1) of the Act, 21 U.S.C.section 360bbb-3(b)(1), unless the authorization is terminated  or revoked sooner.       Influenza A by PCR NEGATIVE NEGATIVE Final   Influenza B by PCR NEGATIVE NEGATIVE Final    Comment: (NOTE) The Xpert Xpress SARS-CoV-2/FLU/RSV plus assay is intended as an aid in the diagnosis of influenza from Nasopharyngeal swab specimens and should not be used as a sole basis for treatment. Nasal washings and aspirates are unacceptable for Xpert Xpress SARS-CoV-2/FLU/RSV testing.  Fact Sheet for Patients: EntrepreneurPulse.com.au  Fact Sheet for Healthcare Providers: IncredibleEmployment.be  This test is not yet approved or cleared by the [place] FDA and has been authorized for detection and/or diagnosis of SARS-CoV-2 by FDA under  an Emergency Use Authorization (EUA). This EUA will remain in effect (meaning this test can be used) for the duration of the COVID-19 declaration under Section 564(b)(1) of the Act, 21 U.S.C. section 360bbb-3(b)(1), unless the authorization is terminated or revoked.     Resp Syncytial Virus by PCR NEGATIVE NEGATIVE Final    Comment: (NOTE) Fact Sheet for Patients: EntrepreneurPulse.com.au  Fact Sheet for Healthcare Providers: IncredibleEmployment.be  This test is not yet approved or cleared by the [place] FDA and has been authorized for detection and/or diagnosis of SARS-CoV-2 by FDA under an Emergency Use Authorization (EUA). This EUA will remain in effect (meaning this test can be used) for the duration of the COVID-19 declaration under Section 564(b)(1) of the Act, 21 U.S.C. section 360bbb-3(b)(1), unless the authorization is terminated or revoked.  Performed at [facility], [place] [address] [place], [place] [address]     Labs: CBC: Recent Labs  Lab 05/27/22 0750 05/27/22 1701 05/28/22 0401 05/28/22 1204 05/28/22 1744 05/29/22 0650 05/29/22 1810 05/30/22 0733  WBC 8.3  --  8.8  --   --   --   --   --   NEUTROABS 6.6  --   --   --   --   --   --   --   HGB 6.3*   < > 6.4* 7.7* 8.8* 7.6* 7.8* 8.3*  HCT 23.2*   < > 22.2* 25.7* 28.8* 25.7* 26.1* 27.6*  MCV 94.3  --  93.7  --   --   --   --   --   PLT 310  --  254  --   --   --   --   --    < > = values in this interval not displayed.   Basic Metabolic Panel: Recent Labs  Lab 05/27/22 0750 05/27/22 1701 05/28/22 0401 05/29/22 0650 05/30/22 0733  NA 139  --  136 137 139  K 4.3  --  3.5 3.5 3.8  CL 104  --  108 105 107  CO2 28  --  '22 26 26  '$ GLUCOSE 125*  --  96 99 99  BUN 25*  --  20 8 6*  CREATININE 0.87  --  0.86 0.85 0.85  CALCIUM 8.4*  --  7.8* 7.6* 8.0*  MG  --  1.9  --   --   --   PHOS  --  2.9  --   --   --    Liver Function  Tests: Recent Labs  Lab 05/27/22 0750 05/28/22 0401  AST 25 24  ALT 17 15  ALKPHOS 84 71  BILITOT 0.6 0.6  PROT 6.0* 5.5*  ALBUMIN 2.8* 2.7*   CBG: No results for input(s): "GLUCAP" in the last 168 hours.  Discharge time spent: {LESS THAN/GREATER GPQD:82641} 30 minutes.  Signed: Hosie Poisson, MD [facility] 05/30/2022

## 2022-05-31 ENCOUNTER — Telehealth: Payer: Self-pay

## 2022-05-31 DIAGNOSIS — C50811 Malignant neoplasm of overlapping sites of right female breast: Secondary | ICD-10-CM | POA: Diagnosis not present

## 2022-05-31 NOTE — Telephone Encounter (Signed)
Called to check on Suzanne [name] and spoke with daughter, Suzanne [name]. She states her mom is feeling better, but they would like a f/u with MD post hospital discharge. Message sent to scheduler to have labs and MD visit within one week.  Suzanne [name] also asks if [name] should resume Orserdu or wait until she comes in to be evaluated. They are waiting on PA from insurance for refill and she only has 3 tabs left. [name] I would confer with MD and someone would be back in touch to advise. She verbalized thanks and understanding. Knows to call with any further questions or concerns.

## 2022-05-31 NOTE — Progress Notes (Signed)
Discharged to home with O2 via PTAR.  D/C instructions provided and discussed with the patient.

## 2022-05-31 NOTE — Telephone Encounter (Signed)
Transition Care Management Follow-up Telephone Call Date of discharge and from where: 05/30/2022, [facility] How have you been since you were released from the hospital? She stated she is feeling better  Any questions or concerns? No  Items Reviewed: Did the pt receive and understand the discharge instructions provided? Yes  Medications obtained and verified? Yes - she has all of the medications she was taking prior to her hospitalization and needs to pick up the protonix tomorrow.  She  has a nebulizer.  Other? No  Any new allergies since your discharge? No  Dietary orders reviewed? Yes- she said she is tolerating a regular diet.  Do you have support at home? Yes , her daughter.   Home Care and Equipment/Supplies: Were home health services ordered? no If so, what is the name of the agency? N/a  Has the agency set up a time to come to the patient's home? not applicable Were any new equipment or medical supplies ordered?  No What is the name of the medical supply agency? N/a Were you able to get the supplies/equipment? not applicable Do you have any questions related to the use of the equipment or supplies? No  Functional Questionnaire: (I = Independent and D = Dependent) ADLs: independent. Ambulates without an assistive device.  Has wheelchair to use if she is going a long distance. Has O2 that she uses continuously @ 2L.   Follow up appointments reviewed:  PCP Hospital f/u appt confirmed? Yes  Scheduled to see Geryl Rankins, NP - 07/09/2022.  [facility] f/u appt confirmed?  Nothing scheduled at this time  Are transportation arrangements needed? No  - uses [facility] If their condition worsens, is the pt aware to call PCP or go to the Emergency Dept.? Yes Was the patient provided with contact information for the PCP's office or ED? Yes Was to pt encouraged to call back with questions or concerns? Yes

## 2022-05-31 NOTE — Telephone Encounter (Signed)
-----   Message from Merril Abbe, LPN sent at 07/02/4268  3:29 PM EST ----- Can you call and f/u with this pt? She was suppose to had seen Mendel Ryder on 1/2 but had gotten admitted to the hospital.

## 2022-06-01 ENCOUNTER — Telehealth: Payer: Self-pay | Admitting: *Deleted

## 2022-06-01 DIAGNOSIS — C50811 Malignant neoplasm of overlapping sites of right female breast: Secondary | ICD-10-CM | POA: Diagnosis not present

## 2022-06-01 NOTE — Telephone Encounter (Signed)
noted 

## 2022-06-01 NOTE — Telephone Encounter (Signed)
Daughter called for recommendations for swelling in pt arm due to IV site access while in the hospital. Gave recommendations and advised daughter that pt should not take Oserdu until f/u appt with Dr.Iruku. Pt daughter verbalized understanding

## 2022-06-04 DIAGNOSIS — C50811 Malignant neoplasm of overlapping sites of right female breast: Secondary | ICD-10-CM | POA: Diagnosis not present

## 2022-06-04 NOTE — Discharge Summary (Signed)
Physician Discharge Summary   Patient: Suzanne [name] MRN: [id number] DOB: Sep 23, 1960  Admit date:     05/27/2022  Discharge date: 05/30/2022  Discharge Physician: Hosie Poisson   PCP: [name], NP   Recommendations at discharge:  Please follow up with PCP in one week.  Please follow up with GI as needed.  Please check cbc and bmp  in one week.   Discharge Diagnoses: Principal Problem:   UGI bleed Active Problems:   Malignant neoplasm metastatic to bone [facility])   Essential hypertension   Class 2 obesity   Pleural effusion, bilateral   Hypoalbuminemia    Hospital Course: Suzanne [name] is a 62 y.o. 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  GI consulted, and she underwent EGD showing a large hiatal hernia, erosive gastropathy, non bleeding gastric ulcers, .   Assessment and Plan:   Acute anemia of blood loss:  possibly  upper GI bleed.  S/P egd showing the a large hiatal hernia, erosive gastropathy, non bleeding gastric ulcers. Patient was started on clears and advanced as tolerated.  She requiring about 1 unit of prbc transfusion and her her H&H remained stable.  She was recommended to follow up with PCP in one week and GI as needed and get CBC in 1 to 2 weeks.        Malignant breast cancer with mets to bone.  Recommend outpatient follow up with oncology.      Bilateral pleural effusions Continue to monitor.        Essential hypertension Blood pressure currently  optimal.        Body mass index is 39.4 kg/m. Obesity.  Monitor.        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  - pain control.       Chronic respiratory failure requiring 2 lit/min of Oxygen.                Estimated body mass index is 39.4 kg/m as calculated from the following:   Height as of this encounter: '4\' 10"'$  (1.473 m).   Weight as of this encounter: 85.5 kg.       Consultants: gastroenterology  Procedures performed: EGD  Disposition: Home Diet recommendation:  Discharge Diet Orders (From admission, onward)     Start     Ordered   05/30/22 0000  Diet - low sodium heart healthy        05/30/22 1420           Regular diet DISCHARGE MEDICATION: Allergies as of 05/30/2022       Reactions   Ferumoxytol Other (See Comments)   Pt had hypersensitivity to Ferumoxytol. She had Facial Flushing. See note from 07/14/2021        Medication List     STOP taking these medications    Advil Dual Action 125-250 MG Tabs Generic drug: Ibuprofen-Acetaminophen   fluconazole 200 MG tablet Commonly known as: DIFLUCAN       TAKE these medications    acetaminophen 500 MG tablet Commonly known as: TYLENOL Take 1,000 mg by mouth daily as needed for mild pain.   cetirizine 10 MG tablet Commonly known as: ZYRTEC Take 10 mg by mouth  daily.   elacestrant hydrochloride 345 MG tablet Commonly known as: Orserdu Take 1 tablet (345 mg total) by mouth daily. Take with food.   famotidine 20 MG tablet Commonly known as: PEPCID Take 1 tablet (20 mg total) by mouth 2 (two) times daily as needed for heartburn or indigestion. What changed: when to take this   folic acid 1 MG tablet Commonly known as: FOLVITE Take 1 tablet (1 mg total) by mouth daily.   furosemide 20 MG tablet Commonly known as: LASIX Take 1 tablet (20 mg total) by mouth daily.   ipratropium-albuterol 0.5-2.5 (3) MG/3ML Soln Commonly known as: DUONEB Take 3 mLs by nebulization every 6 (six) hours as needed.   loperamide 2 MG capsule Commonly known as: IMODIUM Take 2 mg by mouth daily as needed for diarrhea or loose stools.    Melatonin TR 1 MG Tbcr Generic drug: Melatonin ER TAKE ONE TABLET BY MOUTH EVERYDAY AT BEDTIME What changed: See the new instructions.   ondansetron 8 MG tablet Commonly known as: ZOFRAN Take 1 tablet (8 mg total) by mouth every 8 (eight) hours as needed for nausea or vomiting. What changed: when to take this   [place] Calcium Plus D 500-5 MG-MCG Tabs Generic drug: Calcium Carb-Cholecalciferol TAKE ONE TABLET BY MOUTH EVERY MORNING and TAKE ONE TABLET BY MOUTH AT NOON and TAKE ONE TABLET BY MOUTH EVERY EVENING What changed: See the new instructions.   pantoprazole 40 MG tablet Commonly known as: PROTONIX Take 1 tablet (40 mg total) by mouth 2 (two) times daily.   polyethylene glycol 17 g packet Commonly known as: MIRALAX / GLYCOLAX Take 17 g by mouth as needed. What changed:  when to take this reasons to take this   sertraline 50 MG tablet Commonly known as: ZOLOFT TAKE 1 AND 1/2 TABLETS BY MOUTH EVERYDAY AT BEDTIME What changed: See the new instructions.   triamcinolone ointment 0.5 % Commonly known as: KENALOG Apply 1 Application topically 2 (two) times daily. What changed:  when to take this reasons to take this   XGEVA  Inject 1 Dose into the skin every 30 (thirty) days.        Discharge Exam: Filed Weights   05/27/22 1737  Weight: 85.5 kg   General exam: Appears calm and comfortable  Respiratory system: Clear to auscultation. Respiratory effort normal. Cardiovascular system: S1 & S2 heard, RRR. No JVD, murmurs, rubs, gallops or clicks. No pedal edema. Gastrointestinal system: Abdomen is nondistended, soft and nontender. No organomegaly or masses felt. Normal bowel sounds heard. Central nervous system: Alert and oriented. No focal neurological deficits. Extremities: Symmetric 5 x 5 power. Skin: No rashes, lesions or ulcers Psychiatry: Judgement and insight appear normal. Mood & affect appropriate.    Condition at discharge: fair  The results  of significant diagnostics from this hospitalization (including imaging, microbiology, ancillary and laboratory) are listed below for reference.   Imaging Studies: CT ANGIO GI BLEED  Result Date: 05/27/2022 CLINICAL DATA:  Diffuse abdominal pain with dark stools and coffee-ground emesis. Symptomatic anemia. History of metastatic breast cancer EXAM: CTA ABDOMEN AND PELVIS WITHOUT AND WITH CONTRAST TECHNIQUE: Multidetector CT imaging of the abdomen and pelvis was performed using the standard protocol during bolus administration of intravenous contrast. Multiplanar reconstructed images and MIPs were obtained and reviewed to evaluate the vascular anatomy. RADIATION DOSE REDUCTION: This exam was performed according to the departmental dose-optimization program which includes automated exposure control, adjustment of the mA and/or kV according to patient size  and/or use of iterative reconstruction technique. CONTRAST:  183m OMNIPAQUE IOHEXOL 350 MG/ML SOLN COMPARISON:  CT 03/05/2022 FINDINGS: VASCULAR Aorta: Normal caliber aorta without aneurysm, dissection, vasculitis or significant stenosis. Scattered atherosclerotic calcification. Celiac: Patent without evidence of aneurysm, dissection, vasculitis or significant stenosis. SMA: Patent without evidence of aneurysm, dissection, vasculitis or significant stenosis. Renals: Both renal arteries are patent without evidence of aneurysm, dissection, vasculitis, fibromuscular dysplasia or significant stenosis. IMA: Patent. Inflow: Patent without evidence of aneurysm, dissection, vasculitis or significant stenosis. Proximal Outflow: Bilateral common femoral and visualized portions of the superficial and profunda femoral arteries are patent without evidence of aneurysm, dissection, vasculitis or significant stenosis. Veins: Major venous structures including the portal vein and IVC are patent. Review of the MIP images confirms the above findings. NON-VASCULAR Lower chest:  Chronic elevation of the right hemidiaphragm with right basilar atelectasis. Mosaic attenuation of the lung fields. Subtle nodularity along the pleural surfaces of the lung bases is suspicious for metastatic disease. No pleural effusions. Hepatobiliary: Diffuse hepatic metastatic disease. Several of the hepatic masses appear increased in size compared to 03/05/2022. Multiple stones within the gallbladder. No pericholecystic inflammatory changes by CT. Pancreas: Unremarkable. No pancreatic ductal dilatation or surrounding inflammatory changes. Spleen: Normal in size without focal abnormality. Adrenals/Urinary Tract: Unremarkable adrenal glands. Limited assessment for renal calculi given the presence of excreted contrast in the renal collecting systems on pre contrast imaging. No renal lesion or hydronephrosis. Stomach/Bowel: Moderate-large hiatal hernia. No dilated loops of bowel. Normal appendix in the right lower quadrant. Mild scattered diverticulosis. No focal bowel wall thickening or inflammatory changes. No abnormal intraluminal contrast accumulation on multiphasic images to suggest active GI bleed by CT. Lymphatic: No abdominopelvic lymphadenopathy. Reproductive: Uterus and bilateral adnexa are unremarkable. Other: No free fluid. No abdominopelvic fluid collection. No pneumoperitoneum. No abdominal wall hernia. Musculoskeletal: Widespread mixed lytic and sclerotic lesions throughout the skeleton. Unchanged focal kyphosis at the thoracolumbar junction where there are associated chronic compression deformities. Multiple healed bilateral rib fractures. No new pathologic fractures are identified. IMPRESSION: 1. No evidence of active GI bleed by CT. 2. No acute abdominopelvic findings. 3. Diffuse hepatic metastatic disease. Several of the hepatic masses appear increased in size compared to 03/05/2022. 4. Subtle nodularity along the pleural surfaces of the lung bases is suspicious for metastatic disease. 5.  Widespread osseous metastatic disease. No new pathologic fractures are identified. 6. Moderate-large hiatal hernia. 7. Cholelithiasis. Electronically Signed   By: NDavina PokeD.O.   On: 05/27/2022 10:26   DG Chest Portable 1 View  Result Date: 05/27/2022 CLINICAL DATA:  62year old female with history of exertional shortness of breath and chest pressure. EXAM: PORTABLE CHEST 1 VIEW COMPARISON:  Chest x-ray 04/17/2022. FINDINGS: Chronic severe elevation of the right hemidiaphragm is again noted with associated areas of passive subsegmental atelectasis and/or scarring in the right lung base. Left lung is clear. No pleural effusions. No pneumothorax. No evidence of pulmonary edema. Heart size is normal. Upper mediastinal contours are within normal limits. Atherosclerotic calcifications in the thoracic aorta. Large hiatal hernia. IMPRESSION: 1. Radiographic appearance the chest is unchanged, with no definitive evidence to suggest acute cardiopulmonary disease. 2. Aortic atherosclerosis. 3. Large hiatal hernia. Electronically Signed   By: DVinnie LangtonM.D.   On: 05/27/2022 08:28    Microbiology: Results for orders placed or performed during the hospital encounter of 05/27/22  Urine Culture     Status: Abnormal   Collection Time: 05/27/22  7:33 AM   Specimen: Urine, Clean Catch  Result Value Ref Range Status   Specimen Description   Final    URINE, CLEAN CATCH Performed at [facility], [place] [address], [place], [place] [address]    Special Requests   Final    NONE Performed at [facility], [place] [address] [place], [place] [address]    Culture MULTIPLE SPECIES PRESENT, SUGGEST RECOLLECTION (A)  Final   Report Status 05/28/2022 FINAL  Final  Resp panel by RT-PCR (RSV, Flu A&B, Covid) Anterior Nasal Swab     Status: None   Collection Time: 05/27/22  7:56 AM   Specimen: Anterior Nasal Swab  Result Value Ref Range Status   SARS Coronavirus 2 by RT PCR  NEGATIVE NEGATIVE Final    Comment: (NOTE) SARS-CoV-2 target nucleic acids are NOT DETECTED.  The SARS-CoV-2 RNA is generally detectable in upper respiratory specimens during the acute phase of infection. The lowest concentration of SARS-CoV-2 viral copies this assay can detect is 138 copies/mL. A negative result does not preclude SARS-Cov-2 infection and should not be used as the sole basis for treatment or other patient management decisions. A negative result may occur with  improper specimen collection/handling, submission of specimen other than nasopharyngeal swab, presence of viral mutation(s) within the areas targeted by this assay, and inadequate number of viral copies(<138 copies/mL). A negative result must be combined with clinical observations, patient history, and epidemiological information. The expected result is Negative.  Fact Sheet for Patients:  EntrepreneurPulse.com.au  Fact Sheet for Healthcare Providers:  IncredibleEmployment.be  This test is no t yet approved or cleared by the [place] FDA and  has been authorized for detection and/or diagnosis of SARS-CoV-2 by FDA under an Emergency Use Authorization (EUA). This EUA will remain  in effect (meaning this test can be used) for the duration of the COVID-19 declaration under Section 564(b)(1) of the Act, 21 U.S.C.section 360bbb-3(b)(1), unless the authorization is terminated  or revoked sooner.       Influenza A by PCR NEGATIVE NEGATIVE Final   Influenza B by PCR NEGATIVE NEGATIVE Final    Comment: (NOTE) The Xpert Xpress SARS-CoV-2/FLU/RSV plus assay is intended as an aid in the diagnosis of influenza from Nasopharyngeal swab specimens and should not be used as a sole basis for treatment. Nasal washings and aspirates are unacceptable for Xpert Xpress SARS-CoV-2/FLU/RSV testing.  Fact Sheet for Patients: EntrepreneurPulse.com.au  Fact Sheet for  Healthcare Providers: IncredibleEmployment.be  This test is not yet approved or cleared by the [place] FDA and has been authorized for detection and/or diagnosis of SARS-CoV-2 by FDA under an Emergency Use Authorization (EUA). This EUA will remain in effect (meaning this test can be used) for the duration of the COVID-19 declaration under Section 564(b)(1) of the Act, 21 U.S.C. section 360bbb-3(b)(1), unless the authorization is terminated or revoked.     Resp Syncytial Virus by PCR NEGATIVE NEGATIVE Final    Comment: (NOTE) Fact Sheet for Patients: EntrepreneurPulse.com.au  Fact Sheet for Healthcare Providers: IncredibleEmployment.be  This test is not yet approved or cleared by the [place] FDA and has been authorized for detection and/or diagnosis of SARS-CoV-2 by FDA under an Emergency Use Authorization (EUA). This EUA will remain in effect (meaning this test can be used) for the duration of the COVID-19 declaration under Section 564(b)(1) of the Act, 21 U.S.C. section 360bbb-3(b)(1), unless the authorization is terminated or revoked.  Performed at [facility], [place] [address], [place],  [address]  Labs: CBC: Recent Labs  Lab 05/28/22 1204 05/28/22 1744 05/29/22 0650 05/29/22 1810 05/30/22 0733  HGB 7.7* 8.8* 7.6* 7.8* 8.3*  HCT 25.7* 28.8* 25.7* 26.1* 75.8*   Basic Metabolic Panel: Recent Labs  Lab 05/29/22 0650 05/30/22 0733  NA 137 139  K 3.5 3.8  CL 105 107  CO2 26 26  GLUCOSE 99 99  BUN 8 6*  CREATININE 0.85 0.85  CALCIUM 7.6* 8.0*   Liver Function Tests: No results for input(s): "AST", "ALT", "ALKPHOS", "BILITOT", "PROT", "ALBUMIN" in the last 168 hours. CBG: No results for input(s): "GLUCAP" in the last 168 hours.  Discharge time spent:  38 minutes.  Signed: Hosie Poisson, MD [facility]

## 2022-06-05 ENCOUNTER — Inpatient Hospital Stay: Payer: Medicaid Other

## 2022-06-05 ENCOUNTER — Other Ambulatory Visit: Payer: Self-pay

## 2022-06-05 ENCOUNTER — Inpatient Hospital Stay (HOSPITAL_BASED_OUTPATIENT_CLINIC_OR_DEPARTMENT_OTHER): Payer: Medicaid Other | Admitting: Adult Health

## 2022-06-05 ENCOUNTER — Telehealth: Payer: Self-pay

## 2022-06-05 ENCOUNTER — Encounter: Payer: Self-pay | Admitting: Hematology and Oncology

## 2022-06-05 ENCOUNTER — Inpatient Hospital Stay: Payer: Medicaid Other | Attending: Adult Health

## 2022-06-05 VITALS — BP 125/77 | HR 88 | Temp 97.7°F | Resp 17 | Wt 176.6 lb

## 2022-06-05 DIAGNOSIS — D518 Other vitamin B12 deficiency anemias: Secondary | ICD-10-CM

## 2022-06-05 DIAGNOSIS — C7951 Secondary malignant neoplasm of bone: Secondary | ICD-10-CM

## 2022-06-05 DIAGNOSIS — C50911 Malignant neoplasm of unspecified site of right female breast: Secondary | ICD-10-CM | POA: Diagnosis not present

## 2022-06-05 DIAGNOSIS — Z170 Estrogen receptor positive status [ER+]: Secondary | ICD-10-CM | POA: Diagnosis not present

## 2022-06-05 DIAGNOSIS — C50811 Malignant neoplasm of overlapping sites of right female breast: Secondary | ICD-10-CM | POA: Diagnosis present

## 2022-06-05 DIAGNOSIS — J90 Pleural effusion, not elsewhere classified: Secondary | ICD-10-CM

## 2022-06-05 DIAGNOSIS — R21 Rash and other nonspecific skin eruption: Secondary | ICD-10-CM

## 2022-06-05 LAB — CBC WITH DIFFERENTIAL (CANCER CENTER ONLY)
Abs Immature Granulocytes: 0.02 10*3/uL (ref 0.00–0.07)
Basophils Absolute: 0 10*3/uL (ref 0.0–0.1)
Basophils Relative: 1 %
Eosinophils Absolute: 0.2 10*3/uL (ref 0.0–0.5)
Eosinophils Relative: 2 %
HCT: 29.3 % — ABNORMAL LOW (ref 36.0–46.0)
Hemoglobin: 9.2 g/dL — ABNORMAL LOW (ref 12.0–15.0)
Immature Granulocytes: 0 %
Lymphocytes Relative: 10 %
Lymphs Abs: 0.8 10*3/uL (ref 0.7–4.0)
MCH: 28 pg (ref 26.0–34.0)
MCHC: 31.4 g/dL (ref 30.0–36.0)
MCV: 89.1 fL (ref 80.0–100.0)
Monocytes Absolute: 0.5 10*3/uL (ref 0.1–1.0)
Monocytes Relative: 7 %
Neutro Abs: 6.1 10*3/uL (ref 1.7–7.7)
Neutrophils Relative %: 80 %
Platelet Count: 299 10*3/uL (ref 150–400)
RBC: 3.29 MIL/uL — ABNORMAL LOW (ref 3.87–5.11)
RDW: 16.5 % — ABNORMAL HIGH (ref 11.5–15.5)
WBC Count: 7.6 10*3/uL (ref 4.0–10.5)
nRBC: 0 % (ref 0.0–0.2)

## 2022-06-05 LAB — CMP (CANCER CENTER ONLY)
ALT: 17 U/L (ref 0–44)
AST: 28 U/L (ref 15–41)
Albumin: 3.4 g/dL — ABNORMAL LOW (ref 3.5–5.0)
Alkaline Phosphatase: 114 U/L (ref 38–126)
Anion gap: 7 (ref 5–15)
BUN: 14 mg/dL (ref 8–23)
CO2: 29 mmol/L (ref 22–32)
Calcium: 8.6 mg/dL — ABNORMAL LOW (ref 8.9–10.3)
Chloride: 102 mmol/L (ref 98–111)
Creatinine: 0.68 mg/dL (ref 0.44–1.00)
GFR, Estimated: >60 mL/min (ref >60)
Glucose, Bld: 96 mg/dL (ref 70–99)
Potassium: 3.4 mmol/L — ABNORMAL LOW (ref 3.5–5.1)
Sodium: 138 mmol/L (ref 135–145)
Total Bilirubin: 0.4 mg/dL (ref 0.3–1.2)
Total Protein: 6.8 g/dL (ref 6.5–8.1)

## 2022-06-05 LAB — VITAMIN B12: Vitamin B-12: 776 pg/mL (ref 180–914)

## 2022-06-05 LAB — FERRITIN: Ferritin: 42 ng/mL (ref 11–307)

## 2022-06-05 MED ORDER — DENOSUMAB 120 MG/1.7ML ~~LOC~~ SOLN
120.0000 mg | Freq: Once | SUBCUTANEOUS | Status: AC
  Administered 2022-06-05: 120 mg via SUBCUTANEOUS
  Filled 2022-06-05: qty 1.7

## 2022-06-05 NOTE — Progress Notes (Signed)
Referral placed for Amb Derm per Wilber Bihari, NP to [facility] as they are the only practice in the area that accepts Medicaid.

## 2022-06-05 NOTE — Telephone Encounter (Signed)
Called Pt regarding Derm referral and home oxygen issues. Informed Pt that I placed a referral to [facility] as they are the only providers in the area that accept medicaid. Pt verbalized understanding. Pt's daughter stated that they are having issues with faulty oxygen equipment that leaves them without access to full oxygen tanks at home. Per daughter [facility] will send someone out frequently with a quick fix but issues persist. Daughter stated she would call again today as they are having issues presently.  [place] at [phone]. Representative stated that they will wait for Pts call as they need to troubleshoot equipment. She stated that the local branch is [place] but phone number is same as call center. Per rep there is a side filter than Pt is responsible for cleaning, but the internal filter is changed by local branch yearly.  LVM with [facility] in [place], [place] [phone]. Asked for call back regarding faulty equipment and what we can do to help with this.

## 2022-06-05 NOTE — Patient Instructions (Signed)
Denosumab Injection (Oncology) What is this medication? DENOSUMAB (den oh SUE mab) prevents weakened bones caused by cancer. It may also be used to treat noncancerous bone tumors that cannot be removed by surgery. It can also be used to treat high calcium levels in the blood caused by cancer. It works by blocking a protein that causes bones to break down quickly. This slows down the release of calcium from bones, which lowers calcium levels in your blood. It also makes your bones stronger and less likely to break (fracture). This medicine may be used for other purposes; ask your health care provider or pharmacist if you have questions. COMMON BRAND NAME(S): XGEVA What should I tell my care team before I take this medication? They need to know if you have any of these conditions: Dental disease Having surgery or tooth extraction Infection Kidney disease Low levels of calcium or vitamin D in the blood Malnutrition On hemodialysis Skin conditions or sensitivity Thyroid or parathyroid disease An unusual reaction to denosumab, other medications, foods, dyes, or preservatives Pregnant or trying to get pregnant Breast-feeding How should I use this medication? This medication is for injection under the skin. It is given by your care team in a hospital or clinic setting. A special MedGuide will be given to you before each treatment. Be sure to read this information carefully each time. Talk to your care team about the use of this medication in children. While it may be prescribed for children as young as 13 years for selected conditions, precautions do apply. Overdosage: If you think you have taken too much of this medicine contact a poison control center or emergency room at once. NOTE: This medicine is only for you. Do not share this medicine with others. What if I miss a dose? Keep appointments for follow-up doses. It is important not to miss your dose. Call your care team if you are unable to  keep an appointment. What may interact with this medication? Do not take this medication with any of the following: Other medications containing denosumab This medication may also interact with the following: Medications that lower your chance of fighting infection Steroid medications, such as prednisone or cortisone This list may not describe all possible interactions. Give your health care provider a list of all the medicines, herbs, non-prescription drugs, or dietary supplements you use. Also tell them if you smoke, drink alcohol, or use illegal drugs. Some items may interact with your medicine. What should I watch for while using this medication? Your condition will be monitored carefully while you are receiving this medication. You may need blood work while taking this medication. This medication may increase your risk of getting an infection. Call your care team for advice if you get a fever, chills, sore throat, or other symptoms of a cold or flu. Do not treat yourself. Try to avoid being around people who are sick. You should make sure you get enough calcium and vitamin D while you are taking this medication, unless your care team tells you not to. Discuss the foods you eat and the vitamins you take with your care team. Some people who take this medication have severe bone, joint, or muscle pain. This medication may also increase your risk for jaw problems or a broken thigh bone. Tell your care team right away if you have severe pain in your jaw, bones, joints, or muscles. Tell your care team if you have any pain that does not go away or that gets worse. Talk   to your care team if you may be pregnant. Serious birth defects can occur if you take this medication during pregnancy and for 5 months after the last dose. You will need a negative pregnancy test before starting this medication. Contraception is recommended while taking this medication and for 5 months after the last dose. Your care team  can help you find the option that works for you. What side effects may I notice from receiving this medication? Side effects that you should report to your care team as soon as possible: Allergic reactions--skin rash, itching, hives, swelling of the face, lips, tongue, or throat Bone, joint, or muscle pain Low calcium level--muscle pain or cramps, confusion, tingling, or numbness in the hands or feet Osteonecrosis of the jaw--pain, swelling, or redness in the mouth, numbness of the jaw, poor healing after dental work, unusual discharge from the mouth, visible bones in the mouth Side effects that usually do not require medical attention (report to your care team if they continue or are bothersome): Cough Diarrhea Fatigue Headache Nausea This list may not describe all possible side effects. Call your doctor for medical advice about side effects. You may report side effects to FDA at 1-800-FDA-1088. Where should I keep my medication? This medication is given in a hospital or clinic. It will not be stored at home. NOTE: This sheet is a summary. It may not cover all possible information. If you have questions about this medicine, talk to your doctor, pharmacist, or health care provider.  2023 Elsevier/Gold Standard (2021-10-02 00:00:00)  

## 2022-06-05 NOTE — Progress Notes (Signed)
CCa 9.1, Alb 3.4, Ca 8.6  [name] [place], [place], BCPS, BCOP 06/05/2022 3:16 PM

## 2022-06-05 NOTE — Progress Notes (Signed)
[facility] Follow up:    Suzanne Pounds, NP [address] [place] [address]   DIAGNOSIS: Metastatic breast cancer  SUMMARY OF ONCOLOGIC HISTORY: Suzanne [name], [place] woman presenting 10/26/2018 with right-sided inflammatory breast cancer, stage IV, involving lungs, lymph nodes and bones, as follows:             (a) chest CT scan 10/26/2018 shows bilateral pleural effusions, possible lymphangitic spread of tumor, diffuse bony metastatic disease, and significant axillary mediastinal and hilar adenopathy             (b) bone scan 10/27/2018 is a "near SuperScan" consistent with widespread bony metastatic disease             (c) head CT with and without contrast 10/30/2018 shows no intracranial metastatic disease, multiple calvarial lesions             (d) CA-27-29 on 10/27/2018 was 1810.4   (1) pleural fluid from right thoracentesis 10/28/2018 confirms malignant cells consistent with a breast primary, strongly estrogen and progesterone receptor positive, HER-2 not amplified, with an MIB-1 of 2%   (2) anastrozole started 10/29/2018             (a) palbociclib started 11/13/2018 at 100 mg dose             (b) denosumab/xgeva started 11/13/2018              (C) palbociclib dose decreased to '75mg'$              (D) Palbociclib dose decreased to 75 mg every other day in 11/2021   (3) associated problems:             (a) hypoxia secondary to effusions             (b) pain from bone lesions and left humeral and vertebral compression fractures             (c) right upper extremity lymphedema             (d) poor venous access   (4) genetics testing on 12/25/2018 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 STK11, TP53, TSC1, TSC2, and VHL.  The following genes were evaluated for sequence changes only: SDHA and HOXB13 c.251G>A variant only. The [facility] Melanoma Panel analyzed the following 9 genes: BAP1 BRCA2 CDK4 CDKN2A MITF POT1 PTEN RB1 Tp53.    (5) palliative radiation to the left humerus 07/20/2019 - 07/31/2019 Site Technique Total Dose (Gy) Dose per Fx (Gy) Completed Fx Beam Energies  Humerus, Left: Ext_Lt_humerus Complex 30/30 3 10/10 6X    (6) anemia requiring transfusion:              (a) B-12 level 134 FEB 2022: B-12 injectons started              (B) iron deficient, IV iron to start 2/6  (C) Hospitalized with upper GI bleed 05/27/2022-05/30/2022  (7) Elacestrant started 04/30/2022  CURRENT THERAPY: to restart  Elacestrant once received  INTERVAL HISTORY: Suzanne [name] 62 y.o. female returns for follow-up.  She was recently admitted to the hospital from May 27, 2022 to May 30, 2022 for acute anemia of blood loss likely secondary to upper GI bleed.  She underwent EGD which showed a large hiatal hernia, erosive gastropathy, and nonbleeding gastric ulcers.  Hemoglobin on the day of admission was down to 6.3.  Suzanne [name] was taking ibuprofen and tylenol for pain in her arm, and cancer related pain in her bones.  She was instructed to stop taking this by Dr. Paulita Fujita.  She says the tylenol is helping her pain.    They are having difficulty with the company that is overseeing her oxygen.  She is being told that her oxygen system isn't working correctly.  They are going through [facility] home [facility].     Patient Active Problem List   Diagnosis Date Noted   UGI bleed 05/27/2022   Acute prerenal azotemia 04/18/2022    Dehydration 04/18/2022   SIRS (systemic inflammatory response syndrome) (HCC) 04/18/2022   Allergic rhinitis 04/18/2022   Depression 04/18/2022   Anemia of chronic disease 04/18/2022   Bronchitis 04/18/2022   Acute bronchitis 04/17/2022   Intertrigo 01/10/2022   Bacterial vaginitis 01/10/2022   Sepsis due to cellulitis ([place]) 12/06/2021   B12 deficiency anemia 08/15/2020   Sensorineural hearing loss (SNHL) of both ears 08/07/2019   Intractable back pain 02/03/2019   Thoracic compression fracture, closed, initial encounter ([facility]) 02/03/2019   Essential hypertension 02/03/2019   Genetic testing 01/27/2019   Acute pharyngitis    Bacteremia    FTT (failure to thrive) in adult    Fracture closed, humerus, shaft 01/09/2019   Pneumonia 12/25/2018   Acute respiratory disorder in immunocompromised patient ([place]) 12/25/2018   Family history of breast cancer    Family history of prostate cancer    Family history of melanoma    Family history of colon cancer    Pressure injury of skin 11/24/2018   Chronic respiratory failure with hypoxia and hypercapnia ([facility]) 11/19/2018   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   Hypophosphatemia 11/19/2018   Hypocalcemia 11/19/2018   Aspiration pneumonia (HCC) 11/19/2018   SOB (shortness of breath)    Palliative care by specialist    Carcinoma of breast, estrogen receptor positive, stage 4, right ([facility]) 11/16/2018   Hypoalbuminemia 11/16/2018   Pleural effusion 11/14/2018   Goals of care, counseling/discussion 11/06/2018   Malignant neoplasm of overlapping sites of right breast in female, estrogen receptor positive ([place]) 10/31/2018   Lung metastasis 10/31/2018   Pain from bone metastases ([place]) 10/31/2018   Class 2 obesity 10/31/2018   Bone injury    Pleural effusion, bilateral    Shortness of breath    Abnormal breast finding    Hypokalemia    Breast skin changes    Macrocytic anemia    Malignant neoplasm metastatic to bone (HCC)    Anemia, B12  deficiency 10/26/2018    is allergic to ferumoxytol.  MEDICAL HISTORY: Past Medical History:  Diagnosis Date   Cancer [facility])    Family history of breast cancer    Family history of colon cancer    Family history of melanoma    Family history of prostate cancer    Gallstones    Glaucoma    Mastitis    reports history of recurrent mastitis   Metastatic breast cancer    Obesity     SURGICAL HISTORY: Past Surgical History:  Procedure Laterality Date  ESOPHAGOGASTRODUODENOSCOPY (EGD) WITH PROPOFOL N/A 05/27/2022   Procedure: ESOPHAGOGASTRODUODENOSCOPY (EGD) WITH PROPOFOL;  Surgeon: Arta Silence, MD;  Location: [facility];  Service: Gastroenterology;  Laterality: N/A;   GLAUCOMA SURGERY  late 1990's   IR THORACENTESIS ASP PLEURAL SPACE W/IMG GUIDE  10/28/2018   IR THORACENTESIS ASP PLEURAL SPACE W/IMG GUIDE  10/29/2018   OPEN REDUCTION INTERNAL FIXATION (ORIF) DISTAL RADIAL FRACTURE Left 01/09/2019   Procedure: OPEN REDUCTION INTERNAL FIXATION (ORIF) HUMERAL FRACTURE;  Surgeon: Netta Cedars, MD;  Location: [facility];  Service: Orthopedics;  Laterality: Left;    SOCIAL HISTORY: Social History   Socioeconomic History   Marital status: Widowed    Spouse name: Not on file   Number of children: 1   Years of education: Not on file   Highest education level: Not on file  Occupational History   Occupation: Clinical research associate Programs    Comment: Self-employed  Tobacco Use   Smoking status: Never   Smokeless tobacco: Never   Tobacco comments:    second hand smoke exposure  Vaping Use   Vaping Use: Never used  Substance and Sexual Activity   Alcohol use: No   Drug use: No   Sexual activity: Not Currently  Other Topics Concern   Not on file  Social History Narrative   Has a daughter named [name]. Daughter has CP.   Social Determinants of Health   Financial Resource Strain: Low Risk  (08/16/2021)   Overall Financial Resource Strain (CARDIA)    Difficulty of Paying Living  Expenses: Not hard at all  Food Insecurity: No Food Insecurity (05/27/2022)   Hunger Vital Sign    Worried About Running Out of Food in the Last Year: Never true    Ran Out of Food in the Last Year: Never true  Transportation Needs: No Transportation Needs (05/27/2022)   PRAPARE - Hydrologist (Medical): No    Lack of Transportation (Non-Medical): No  Physical Activity: Not on file  Stress: Stress Concern Present (11/08/2021)   [place]    Feeling of Stress : To some extent  Social Connections: Socially Isolated (03/27/2021)   Social Connection and Isolation Panel [NHANES]    Frequency of Communication with Friends and Family: Once a week    Frequency of Social Gatherings with Friends and Family: More than three times a week    Attends Religious Services: Never    Marine scientist or Organizations: No    Attends Archivist Meetings: Never    Marital Status: Widowed  Intimate Partner Violence: Not At Risk (05/27/2022)   Humiliation, Afraid, Rape, and Kick questionnaire    Fear of Current or Ex-Partner: No    Emotionally Abused: No    Physically Abused: No    Sexually Abused: No    FAMILY HISTORY: Family History  Problem Relation Age of Onset   Breast cancer Sister        in her 41's-70s   Heart disease Brother        CABG   Heart disease Brother        CABG   Skin cancer Brother        melanoma   Colon cancer Cousin    Heart attack Mother    Heart attack Father    Prostate cancer Brother     Review of Systems  Constitutional:  Negative for appetite change, chills, fatigue, fever and unexpected weight change.  HENT:  Negative for hearing loss, lump/mass and trouble swallowing.   Eyes:  Negative for eye problems and icterus.  Respiratory:  Positive for shortness of breath (improved back to baseline). Negative for chest tightness and cough.   Cardiovascular:   Negative for chest pain, leg swelling and palpitations.  Gastrointestinal:  Negative for abdominal distention, abdominal pain, constipation, diarrhea, nausea and vomiting.  Endocrine: Negative for hot flashes.  Genitourinary:  Negative for difficulty urinating.   Musculoskeletal:  Negative for arthralgias.  Skin:  Negative for itching and rash.  Neurological:  Negative for dizziness, extremity weakness, headaches and numbness.  Hematological:  Negative for adenopathy. Does not bruise/bleed easily.  Psychiatric/Behavioral:  Negative for depression. The patient is not nervous/anxious.       PHYSICAL EXAMINATION  ECOG PERFORMANCE STATUS: 2 - Symptomatic, <50% confined to bed  Vitals:   06/05/22 1356  BP: 125/77  Pulse: 88  Resp: 17  Temp: 97.7 F (36.5 C)  SpO2: 100%    Physical Exam Constitutional:      General: She is not in acute distress.    Appearance: Normal appearance. She is not toxic-appearing.  HENT:     Head: Normocephalic and atraumatic.  Eyes:     General: No scleral icterus. Cardiovascular:     Rate and Rhythm: Normal rate and regular rhythm.     Pulses: Normal pulses.     Heart sounds: Normal heart sounds.  Pulmonary:     Effort: Pulmonary effort is normal.     Breath sounds: Normal breath sounds.  Abdominal:     General: Abdomen is flat. Bowel sounds are normal. There is no distension.     Palpations: Abdomen is soft.     Tenderness: There is no abdominal tenderness.  Musculoskeletal:        General: No swelling.     Cervical back: Neck supple.  Lymphadenopathy:     Cervical: No cervical adenopathy.  Skin:    General: Skin is warm and dry.     Findings: Rash (persistent underneath right breast--improved according to the patient and daughter) present.  Neurological:     General: No focal deficit present.     Mental Status: She is alert.  Psychiatric:        Mood and Affect: Mood normal.        Behavior: Behavior normal.     LABORATORY  DATA:  CBC    Component Value Date/Time   WBC 7.6 06/05/2022 1338   WBC 8.8 05/28/2022 0401   RBC 3.29 (L) 06/05/2022 1338   HGB 9.2 (L) 06/05/2022 1338   HCT 29.3 (L) 06/05/2022 1338   PLT 299 06/05/2022 1338   MCV 89.1 06/05/2022 1338   MCH 28.0 06/05/2022 1338   MCHC 31.4 06/05/2022 1338   RDW 16.5 (H) 06/05/2022 1338   LYMPHSABS 0.8 06/05/2022 1338   MONOABS 0.5 06/05/2022 1338   EOSABS 0.2 06/05/2022 1338   BASOSABS 0.0 06/05/2022 1338    CMP     Component Value Date/Time   NA 138 06/05/2022 1338   K 3.4 (L) 06/05/2022 1338   CL 102 06/05/2022 1338   CO2 29 06/05/2022 1338   GLUCOSE 96 06/05/2022 1338   BUN 14 06/05/2022 1338   CREATININE 0.68 06/05/2022 1338   CALCIUM 8.6 (L) 06/05/2022 1338   PROT 6.8 06/05/2022 1338   ALBUMIN 3.4 (L) 06/05/2022 1338   AST 28 06/05/2022 1338   ALT 17 06/05/2022 1338   ALKPHOS 114 06/05/2022 1338   BILITOT 0.4  06/05/2022 1338   GFRNONAA >60 06/05/2022 1338   GFRAA >60 02/24/2020 1454   GFRAA >60 07/24/2019 1453       ASSESSMENT and THERAPY PLAN:   Carcinoma of breast, estrogen receptor positive, stage 4, right (HCC) Suzanne [name] is a 62 year old woman with metastatic breast cancer here today for f/u after her recent hospitalization for GI bleed.  Her labs are stable today and her anemia has improved.    I reviewed with her lab improvement and the fact that she is taking Protonix daily, she can restart her Elacestrant after thoroughly reviewing adverse reactions in clinical key and verifying gastric ulcers was not included on the list.    She will continue to take tylenol for her general aches and pains.  I encouraged her to limit it to 2g per day.  We discussed if the pain isn't controlled with tylenol that an alternative would be tramadol.  Right now she would like to stick with Tylenol.    I asked my nurse to f/u on the dermatology referral along with calling her home health company about her oxygen arrangement.    Suzanne [name]  will proceed with Delton See today.  Her calcium level is slightly decreased.  She is going to take extra calcium today.    We will see her back in 4 weeks for labs, f/u, and her next injection.     All questions were answered. The patient knows to call the clinic with any problems, questions or concerns. We can certainly see the patient much sooner if necessary.  Total encounter time:45 minutes*in face-to-face visit time, chart review, lab review, care coordination, order entry, and documentation of the encounter time.  Wilber Bihari, NP 06/05/22 8:17 PM Medical Oncology and Hematology [facility] [place], [place] [address] Tel. [phone]    Fax. [phone]  *Total Encounter Time as defined by the Centers for Medicare and Medicaid Services includes, in addition to the face-to-face time of a patient visit (documented in the note above) non-face-to-face time: obtaining and reviewing outside history, ordering and reviewing medications, tests or procedures, care coordination (communications with other health care professionals or caregivers) and documentation in the medical record.

## 2022-06-05 NOTE — Assessment & Plan Note (Signed)
Suzanne [name] is a 62 year old woman with metastatic breast cancer here today for f/u after her recent hospitalization for GI bleed.  Her labs are stable today and her anemia has improved.    I reviewed with her lab improvement and the fact that she is taking Protonix daily, she can restart her Elacestrant after thoroughly reviewing adverse reactions in clinical key and verifying gastric ulcers was not included on the list.    She will continue to take tylenol for her general aches and pains.  I encouraged her to limit it to 2g per day.  We discussed if the pain isn't controlled with tylenol that an alternative would be tramadol.  Right now she would like to stick with Tylenol.    I asked my nurse to f/u on the dermatology referral along with calling her home health company about her oxygen arrangement.    [name] will proceed with Delton See today.  Her calcium level is slightly decreased.  She is going to take extra calcium today.    We will see her back in 4 weeks for labs, f/u, and her next injection.

## 2022-06-06 ENCOUNTER — Ambulatory Visit: Payer: Medicaid Other | Admitting: Dermatology

## 2022-06-06 ENCOUNTER — Encounter: Payer: Self-pay | Admitting: Adult Health

## 2022-06-06 ENCOUNTER — Telehealth: Payer: Self-pay

## 2022-06-06 ENCOUNTER — Other Ambulatory Visit: Payer: Self-pay

## 2022-06-06 DIAGNOSIS — R21 Rash and other nonspecific skin eruption: Secondary | ICD-10-CM

## 2022-06-06 DIAGNOSIS — Z170 Estrogen receptor positive status [ER+]: Secondary | ICD-10-CM

## 2022-06-06 DIAGNOSIS — C50811 Malignant neoplasm of overlapping sites of right female breast: Secondary | ICD-10-CM | POA: Diagnosis not present

## 2022-06-06 DIAGNOSIS — L304 Erythema intertrigo: Secondary | ICD-10-CM

## 2022-06-06 LAB — CANCER ANTIGEN 27.29: CA 27.29: 875.8 U/mL — ABNORMAL HIGH (ref 0.0–38.6)

## 2022-06-06 NOTE — Telephone Encounter (Signed)
Referral to Dr Stark Klein faxed to [phone]. Fax confirmation received. S/w Merleen Nicely, RN at Dr Marlowe Aschoff office who states, "Called and scheduled pt to see Dr. Barry Dienes for skin punch bx procedure 1/17 at 3pm. Pt aware and verbalized understanding."

## 2022-06-06 NOTE — Telephone Encounter (Signed)
Spoke with daughter, [name] again today in regards to pt's dermatology appt. She is scheduled with [facility] as well. She will f/u with them per the schedule as well as with Dr Barry Dienes.   Most recent office visit note faxed to [facility] per their request for her Orserdu. Fax confirmation received. MD is aware of medication delay.

## 2022-06-07 ENCOUNTER — Telehealth: Payer: Self-pay | Admitting: *Deleted

## 2022-06-07 ENCOUNTER — Other Ambulatory Visit: Payer: Self-pay | Admitting: Adult Health

## 2022-06-07 DIAGNOSIS — C7951 Secondary malignant neoplasm of bone: Secondary | ICD-10-CM

## 2022-06-07 DIAGNOSIS — C50811 Malignant neoplasm of overlapping sites of right female breast: Secondary | ICD-10-CM | POA: Diagnosis not present

## 2022-06-07 MED ORDER — TRAMADOL HCL 50 MG PO TABS: 50.0000 mg | ORAL_TABLET | Freq: Four times a day (QID) | ORAL | 0 refills | Status: DC | PRN

## 2022-06-07 NOTE — Telephone Encounter (Signed)
Pt daughter called with concerns of increased pain that has no resolved with Tylenol. Pt daughter also wanted to make office aware of dermatology appt was made for 1/17 for pt and pt O2 machine that had broken throughout the night and is currently being serviced. In basket was sent to Wilber Bihari, NP to f/u on pain management. Pt daughter verbalized understanding.

## 2022-06-08 DIAGNOSIS — C50811 Malignant neoplasm of overlapping sites of right female breast: Secondary | ICD-10-CM | POA: Diagnosis not present

## 2022-06-11 ENCOUNTER — Ambulatory Visit: Payer: Medicaid Other | Admitting: Adult Health

## 2022-06-11 ENCOUNTER — Other Ambulatory Visit: Payer: Medicaid Other

## 2022-06-11 ENCOUNTER — Ambulatory Visit: Payer: Medicaid Other

## 2022-06-11 ENCOUNTER — Telehealth: Payer: Self-pay

## 2022-06-11 NOTE — Telephone Encounter (Signed)
Returned call from Pts daughter regarding stomach virus. Daughter states that she passed a stomach virus to Pt, who is having nausea and diarrhea with no fever, and asks if Pt can take imodium and anti-nausea meds. Advised that Pt can take imodium and zofran, but to call back if sx worsen. Daughter verbalized understanding.  Daughter also asked if tramadol and orserdu are available at pharmacy. Tramadol available at [facility]. Attempted to call [facility] to inquire about Orserdu rx, closed for holiday.

## 2022-06-12 ENCOUNTER — Telehealth: Payer: Self-pay

## 2022-06-12 NOTE — Telephone Encounter (Signed)
Attempted to place call to [facility] to inquire about delay in pt's Orserdu. Left detailed message requesting call back to discuss.

## 2022-06-13 ENCOUNTER — Encounter: Payer: Self-pay | Admitting: Hematology and Oncology

## 2022-06-13 ENCOUNTER — Other Ambulatory Visit (HOSPITAL_COMMUNITY): Payer: Self-pay

## 2022-06-13 DIAGNOSIS — C50811 Malignant neoplasm of overlapping sites of right female breast: Secondary | ICD-10-CM | POA: Diagnosis not present

## 2022-06-14 ENCOUNTER — Other Ambulatory Visit (HOSPITAL_COMMUNITY): Payer: Self-pay

## 2022-06-14 DIAGNOSIS — C50811 Malignant neoplasm of overlapping sites of right female breast: Secondary | ICD-10-CM | POA: Diagnosis not present

## 2022-06-19 ENCOUNTER — Other Ambulatory Visit: Payer: Self-pay | Admitting: General Surgery

## 2022-06-19 DIAGNOSIS — L408 Other psoriasis: Secondary | ICD-10-CM | POA: Diagnosis not present

## 2022-06-19 DIAGNOSIS — L308 Other specified dermatitis: Secondary | ICD-10-CM | POA: Diagnosis not present

## 2022-06-19 DIAGNOSIS — R21 Rash and other nonspecific skin eruption: Secondary | ICD-10-CM | POA: Diagnosis not present

## 2022-06-20 DIAGNOSIS — C50811 Malignant neoplasm of overlapping sites of right female breast: Secondary | ICD-10-CM | POA: Diagnosis not present

## 2022-06-21 DIAGNOSIS — C50811 Malignant neoplasm of overlapping sites of right female breast: Secondary | ICD-10-CM | POA: Diagnosis not present

## 2022-06-22 DIAGNOSIS — C50811 Malignant neoplasm of overlapping sites of right female breast: Secondary | ICD-10-CM | POA: Diagnosis not present

## 2022-06-25 DIAGNOSIS — C50811 Malignant neoplasm of overlapping sites of right female breast: Secondary | ICD-10-CM | POA: Diagnosis not present

## 2022-06-26 ENCOUNTER — Other Ambulatory Visit: Payer: Self-pay | Admitting: Adult Health

## 2022-06-26 DIAGNOSIS — L409 Psoriasis, unspecified: Secondary | ICD-10-CM

## 2022-06-26 DIAGNOSIS — C50811 Malignant neoplasm of overlapping sites of right female breast: Secondary | ICD-10-CM | POA: Diagnosis not present

## 2022-06-28 DIAGNOSIS — J9621 Acute and chronic respiratory failure with hypoxia: Secondary | ICD-10-CM | POA: Diagnosis not present

## 2022-06-28 DIAGNOSIS — C7801 Secondary malignant neoplasm of right lung: Secondary | ICD-10-CM | POA: Diagnosis not present

## 2022-06-28 DIAGNOSIS — C50811 Malignant neoplasm of overlapping sites of right female breast: Secondary | ICD-10-CM | POA: Diagnosis not present

## 2022-06-28 DIAGNOSIS — J40 Bronchitis, not specified as acute or chronic: Secondary | ICD-10-CM | POA: Diagnosis not present

## 2022-06-28 DIAGNOSIS — R6510 Systemic inflammatory response syndrome (SIRS) of non-infectious origin without acute organ dysfunction: Secondary | ICD-10-CM | POA: Diagnosis not present

## 2022-06-28 DIAGNOSIS — R0602 Shortness of breath: Secondary | ICD-10-CM | POA: Diagnosis not present

## 2022-06-28 DIAGNOSIS — S22000A Wedge compression fracture of unspecified thoracic vertebra, initial encounter for closed fracture: Secondary | ICD-10-CM | POA: Diagnosis not present

## 2022-06-28 DIAGNOSIS — N19 Unspecified kidney failure: Secondary | ICD-10-CM | POA: Diagnosis not present

## 2022-06-28 DIAGNOSIS — J309 Allergic rhinitis, unspecified: Secondary | ICD-10-CM | POA: Diagnosis not present

## 2022-06-29 DIAGNOSIS — C50811 Malignant neoplasm of overlapping sites of right female breast: Secondary | ICD-10-CM | POA: Diagnosis not present

## 2022-07-02 DIAGNOSIS — C50811 Malignant neoplasm of overlapping sites of right female breast: Secondary | ICD-10-CM | POA: Diagnosis not present

## 2022-07-03 ENCOUNTER — Inpatient Hospital Stay (HOSPITAL_BASED_OUTPATIENT_CLINIC_OR_DEPARTMENT_OTHER): Payer: Medicaid Other | Admitting: Hematology and Oncology

## 2022-07-03 ENCOUNTER — Inpatient Hospital Stay: Payer: Medicaid Other

## 2022-07-03 ENCOUNTER — Inpatient Hospital Stay: Payer: Medicaid Other | Attending: Adult Health

## 2022-07-03 ENCOUNTER — Other Ambulatory Visit: Payer: Self-pay

## 2022-07-03 VITALS — BP 132/92 | HR 80 | Temp 97.9°F | Resp 16 | Ht <= 58 in | Wt 176.8 lb

## 2022-07-03 DIAGNOSIS — Z9981 Dependence on supplemental oxygen: Secondary | ICD-10-CM | POA: Diagnosis not present

## 2022-07-03 DIAGNOSIS — C7951 Secondary malignant neoplasm of bone: Secondary | ICD-10-CM

## 2022-07-03 DIAGNOSIS — J90 Pleural effusion, not elsewhere classified: Secondary | ICD-10-CM

## 2022-07-03 DIAGNOSIS — Z79899 Other long term (current) drug therapy: Secondary | ICD-10-CM | POA: Diagnosis not present

## 2022-07-03 DIAGNOSIS — Z170 Estrogen receptor positive status [ER+]: Secondary | ICD-10-CM | POA: Diagnosis not present

## 2022-07-03 DIAGNOSIS — D518 Other vitamin B12 deficiency anemias: Secondary | ICD-10-CM

## 2022-07-03 DIAGNOSIS — Z803 Family history of malignant neoplasm of breast: Secondary | ICD-10-CM | POA: Diagnosis not present

## 2022-07-03 DIAGNOSIS — N61 Mastitis without abscess: Secondary | ICD-10-CM | POA: Insufficient documentation

## 2022-07-03 DIAGNOSIS — L409 Psoriasis, unspecified: Secondary | ICD-10-CM | POA: Diagnosis not present

## 2022-07-03 DIAGNOSIS — C50811 Malignant neoplasm of overlapping sites of right female breast: Secondary | ICD-10-CM | POA: Insufficient documentation

## 2022-07-03 DIAGNOSIS — R059 Cough, unspecified: Secondary | ICD-10-CM | POA: Insufficient documentation

## 2022-07-03 DIAGNOSIS — Z800 Family history of malignant neoplasm of digestive organs: Secondary | ICD-10-CM | POA: Diagnosis not present

## 2022-07-03 DIAGNOSIS — Z808 Family history of malignant neoplasm of other organs or systems: Secondary | ICD-10-CM | POA: Insufficient documentation

## 2022-07-03 LAB — CMP (CANCER CENTER ONLY)
ALT: 18 U/L (ref 0–44)
AST: 37 U/L (ref 15–41)
Albumin: 3.5 g/dL (ref 3.5–5.0)
Alkaline Phosphatase: 146 U/L — ABNORMAL HIGH (ref 38–126)
Anion gap: 7 (ref 5–15)
BUN: 13 mg/dL (ref 8–23)
CO2: 27 mmol/L (ref 22–32)
Calcium: 8.8 mg/dL — ABNORMAL LOW (ref 8.9–10.3)
Chloride: 105 mmol/L (ref 98–111)
Creatinine: 0.84 mg/dL (ref 0.44–1.00)
GFR, Estimated: >60 mL/min (ref >60)
Glucose, Bld: 100 mg/dL — ABNORMAL HIGH (ref 70–99)
Potassium: 3.6 mmol/L (ref 3.5–5.1)
Sodium: 139 mmol/L (ref 135–145)
Total Bilirubin: 0.5 mg/dL (ref 0.3–1.2)
Total Protein: 6.7 g/dL (ref 6.5–8.1)

## 2022-07-03 LAB — CBC WITH DIFFERENTIAL (CANCER CENTER ONLY)
Abs Immature Granulocytes: 0.01 10*3/uL (ref 0.00–0.07)
Basophils Absolute: 0 10*3/uL (ref 0.0–0.1)
Basophils Relative: 1 %
Eosinophils Absolute: 0.2 10*3/uL (ref 0.0–0.5)
Eosinophils Relative: 3 %
HCT: 31.6 % — ABNORMAL LOW (ref 36.0–46.0)
Hemoglobin: 9.7 g/dL — ABNORMAL LOW (ref 12.0–15.0)
Immature Granulocytes: 0 %
Lymphocytes Relative: 15 %
Lymphs Abs: 1 10*3/uL (ref 0.7–4.0)
MCH: 26.6 pg (ref 26.0–34.0)
MCHC: 30.7 g/dL (ref 30.0–36.0)
MCV: 86.8 fL (ref 80.0–100.0)
Monocytes Absolute: 0.4 10*3/uL (ref 0.1–1.0)
Monocytes Relative: 6 %
Neutro Abs: 5 10*3/uL (ref 1.7–7.7)
Neutrophils Relative %: 75 %
Platelet Count: 297 10*3/uL (ref 150–400)
RBC: 3.64 MIL/uL — ABNORMAL LOW (ref 3.87–5.11)
RDW: 17.6 % — ABNORMAL HIGH (ref 11.5–15.5)
WBC Count: 6.7 10*3/uL (ref 4.0–10.5)
nRBC: 0 % (ref 0.0–0.2)

## 2022-07-03 LAB — VITAMIN B12: Vitamin B-12: 480 pg/mL (ref 180–914)

## 2022-07-03 LAB — FERRITIN: Ferritin: 16 ng/mL (ref 11–307)

## 2022-07-03 MED ORDER — DENOSUMAB 120 MG/1.7ML ~~LOC~~ SOLN
120.0000 mg | Freq: Once | SUBCUTANEOUS | Status: AC
  Administered 2022-07-03: 120 mg via SUBCUTANEOUS
  Filled 2022-07-03: qty 1.7

## 2022-07-03 NOTE — Progress Notes (Signed)
[facility] Follow up:    Suzanne Pounds, NP [address] [place] [address]  DIAGNOSIS: Metastatic breast cancer  SUMMARY OF ONCOLOGIC HISTORY: Suzanne [name], Suzanne [name] woman presenting 10/26/2018 with right-sided inflammatory breast cancer, stage IV, involving lungs, lymph nodes and bones, as follows:             (a) chest CT scan 10/26/2018 shows bilateral pleural effusions, possible lymphangitic spread of tumor, diffuse bony metastatic disease, and significant axillary mediastinal and hilar adenopathy             (b) bone scan 10/27/2018 is a "near SuperScan" consistent with widespread bony metastatic disease             (c) head CT with and without contrast 10/30/2018 shows no intracranial metastatic disease, multiple calvarial lesions             (d) CA-27-29 on 10/27/2018 was 1810.4   (1) pleural fluid from right thoracentesis 10/28/2018 confirms malignant cells consistent with a breast primary, strongly estrogen and progesterone receptor positive, HER-2 not amplified, with an MIB-1 of 2%   (2) anastrozole started 10/29/2018             (a) palbociclib started 11/13/2018 at 100 mg dose             (b) denosumab/xgeva started 11/13/2018              (C) palbociclib dose decreased to '75mg'$              (D) Palbociclib dose decreased to 75 mg every other day in 11/2021   (3) associated problems:             (a) hypoxia secondary to effusions             (b) pain from bone lesions and left humeral and vertebral compression fractures             (c) right upper extremity lymphedema             (d) poor venous access   (4) genetics testing on 12/25/2018 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 STK11, TP53, TSC1, TSC2, and VHL.  The following genes were evaluated for sequence changes only: SDHA and HOXB13 c.251G>A variant only. The [facility] Melanoma Panel analyzed the following 9 genes: BAP1 BRCA2 CDK4 CDKN2A MITF POT1 PTEN RB1 Tp53.    (5) palliative radiation to the left humerus 07/20/2019 - 07/31/2019 Site Technique Total Dose (Gy) Dose per Fx (Gy) Completed Fx Beam Energies  Humerus, Left: Ext_Lt_humerus Complex 30/30 3 10/10 6X    (6) anemia requiring transfusion:              (a) B-12 level 134 FEB 2022: B-12 injectons started              (B) iron deficient, IV iron to start 2/6  (C) Hospitalized with upper GI bleed 05/27/2022-05/30/2022  (7) Elacestrant started 04/30/2022  CURRENT THERAPY: to restart Elacestrant  once received  INTERVAL HISTORY: Suzanne [name] 62 y.o. female returns for follow-up.  She had skin biopsy and this showed psoriasis. She however hasn't been started on treatment apparently. She also had acute blood loss anemia from GI bleed. Since we started elacestrant, she took about 2 months of onserdu. She doesn't complain of any other new symptoms.  At home, she is mobile. Rest of the pertinent 10 point ROS reviewed and neg.   Patient Active Problem List   Diagnosis Date Noted   UGI bleed 05/27/2022   Acute prerenal azotemia 04/18/2022   Dehydration 04/18/2022   SIRS (systemic inflammatory response syndrome) (HCC) 04/18/2022   Allergic rhinitis 04/18/2022   Depression 04/18/2022   Anemia of chronic disease 04/18/2022   Bronchitis 04/18/2022   Acute bronchitis 04/17/2022   Intertrigo 01/10/2022   Bacterial vaginitis 01/10/2022   Sepsis due to cellulitis ([place]) 12/06/2021   B12  deficiency anemia 08/15/2020   Sensorineural hearing loss (SNHL) of both ears 08/07/2019   Intractable back pain 02/03/2019   Thoracic compression fracture, closed, initial encounter ([place]) 02/03/2019   Essential hypertension 02/03/2019   Genetic testing 01/27/2019   Acute pharyngitis    Bacteremia    FTT (failure to thrive) in adult    Fracture closed, humerus, shaft 01/09/2019   Pneumonia 12/25/2018   Acute respiratory disorder in immunocompromised patient ([place]) 12/25/2018   Family history of breast cancer    Family history of prostate cancer    Family history of melanoma    Family history of colon cancer    Pressure injury of skin 11/24/2018   Chronic respiratory failure with hypoxia and hypercapnia ([place]) 11/19/2018   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   Hypophosphatemia 11/19/2018   Hypocalcemia 11/19/2018   Aspiration pneumonia (HCC) 11/19/2018   SOB (shortness of breath)    Palliative care by specialist    Carcinoma of breast, estrogen receptor positive, stage 4, right ([place]) 11/16/2018   Hypoalbuminemia 11/16/2018   Pleural effusion 11/14/2018   Goals of care, counseling/discussion 11/06/2018   Malignant neoplasm of overlapping sites of right breast in female, estrogen receptor positive ([place]) 10/31/2018   Lung metastasis 10/31/2018   Pain from bone metastases ([place]) 10/31/2018   Class 2 obesity 10/31/2018   Bone injury    Pleural effusion, bilateral    Shortness of breath    Abnormal breast finding    Hypokalemia    Breast skin changes    Macrocytic anemia    Malignant neoplasm metastatic to bone (HCC)    Anemia, B12 deficiency 10/26/2018    is allergic to ferumoxytol.  MEDICAL HISTORY: Past Medical History:  Diagnosis Date   Cancer [facility])    Family history of breast cancer    Family history of colon cancer    Family history of melanoma    Family history of prostate cancer    Gallstones    Glaucoma    Mastitis    reports history of recurrent mastitis    Metastatic breast cancer    Obesity     SURGICAL HISTORY: Past Surgical History:  Procedure Laterality Date   ESOPHAGOGASTRODUODENOSCOPY (EGD) WITH PROPOFOL N/A 05/27/2022   Procedure: ESOPHAGOGASTRODUODENOSCOPY (EGD) WITH PROPOFOL;  Surgeon: Arta Silence, MD;  Location: [facility];  Service: Gastroenterology;  Laterality: N/A;   GLAUCOMA SURGERY  late 1990's   IR THORACENTESIS ASP PLEURAL SPACE W/IMG GUIDE  10/28/2018   IR THORACENTESIS ASP PLEURAL SPACE W/IMG GUIDE  10/29/2018   OPEN REDUCTION INTERNAL FIXATION (ORIF) DISTAL RADIAL FRACTURE Left  01/09/2019   Procedure: OPEN REDUCTION INTERNAL FIXATION (ORIF) HUMERAL FRACTURE;  Surgeon: Netta Cedars, MD;  Location: [facility];  Service: Orthopedics;  Laterality: Left;    SOCIAL HISTORY: Social History   Socioeconomic History   Marital status: Widowed    Spouse name: Not on file   Number of children: 1   Years of education: Not on file   Highest education level: Not on file  Occupational History   Occupation: Clinical research associate Programs    Comment: Self-employed  Tobacco Use   Smoking status: Never   Smokeless tobacco: Never   Tobacco comments:    second hand smoke exposure  Vaping Use   Vaping Use: Never used  Substance and Sexual Activity   Alcohol use: No   Drug use: No   Sexual activity: Not Currently  Other Topics Concern   Not on file  Social History Narrative   Has a daughter named [name]. Daughter has CP.   Social Determinants of Health   Financial Resource Strain: Low Risk  (08/16/2021)   Overall Financial Resource Strain (CARDIA)    Difficulty of Paying Living Expenses: Not hard at all  Food Insecurity: No Food Insecurity (05/27/2022)   Hunger Vital Sign    Worried About Running Out of Food in the Last Year: Never true    Ran Out of Food in the Last Year: Never true  Transportation Needs: No Transportation Needs (05/27/2022)   PRAPARE - Hydrologist (Medical): No    Lack of  Transportation (Non-Medical): No  Physical Activity: Not on file  Stress: Stress Concern Present (11/08/2021)   [place]    Feeling of Stress : To some extent  Social Connections: Socially Isolated (03/27/2021)   Social Connection and Isolation Panel [NHANES]    Frequency of Communication with Friends and Family: Once a week    Frequency of Social Gatherings with Friends and Family: More than three times a week    Attends Religious Services: Never    Marine scientist or Organizations: No    Attends Archivist Meetings: Never    Marital Status: Widowed  Intimate Partner Violence: Not At Risk (05/27/2022)   Humiliation, Afraid, Rape, and Kick questionnaire    Fear of Current or Ex-Partner: No    Emotionally Abused: No    Physically Abused: No    Sexually Abused: No    FAMILY HISTORY: Family History  Problem Relation Age of Onset   Breast cancer Sister        in her 4's-70s   Heart disease Brother        CABG   Heart disease Brother        CABG   Skin cancer Brother        melanoma   Colon cancer Cousin    Heart attack Mother    Heart attack Father    Prostate cancer Brother     Review of Systems  Constitutional:  Negative for appetite change, chills, fatigue, fever and unexpected weight change.  HENT:   Negative for hearing loss, lump/mass and trouble swallowing.   Eyes:  Negative for eye problems and icterus.  Respiratory:  Positive for shortness of breath (improved back to baseline). Negative for chest tightness and cough.   Cardiovascular:  Negative for chest pain, leg swelling and palpitations.  Gastrointestinal:  Negative for abdominal distention, abdominal pain, constipation, diarrhea, nausea and vomiting.  Endocrine: Negative for hot  flashes.  Genitourinary:  Negative for difficulty urinating.   Musculoskeletal:  Negative for arthralgias.  Skin:  Negative for itching and rash.   Neurological:  Negative for dizziness, extremity weakness, headaches and numbness.  Hematological:  Negative for adenopathy. Does not bruise/bleed easily.  Psychiatric/Behavioral:  Negative for depression. The patient is not nervous/anxious.       PHYSICAL EXAMINATION  ECOG PERFORMANCE STATUS: 2 - Symptomatic, <50% confined to bed  Vitals:   07/03/22 1434  BP: (!) 132/92  Pulse: 80  Resp: 16  Temp: 97.9 F (36.6 C)  SpO2: 100%    Physical Exam Constitutional:      General: She is not in acute distress.    Appearance: Normal appearance. She is not toxic-appearing.  HENT:     Head: Normocephalic and atraumatic.  Eyes:     General: No scleral icterus. Cardiovascular:     Rate and Rhythm: Normal rate and regular rhythm.     Pulses: Normal pulses.     Heart sounds: Normal heart sounds.  Pulmonary:     Effort: Pulmonary effort is normal.     Breath sounds: Normal breath sounds.  Abdominal:     General: Abdomen is flat. Bowel sounds are normal. There is no distension.     Palpations: Abdomen is soft.     Tenderness: There is no abdominal tenderness.  Musculoskeletal:        General: No swelling.     Cervical back: Neck supple.  Lymphadenopathy:     Cervical: No cervical adenopathy.  Skin:    General: Skin is warm and dry.     Findings: Rash (persistent underneath right breast--improved according to the patient and daughter) present.  Neurological:     General: No focal deficit present.     Mental Status: She is alert.  Psychiatric:        Mood and Affect: Mood normal.        Behavior: Behavior normal.     LABORATORY DATA:  CBC    Component Value Date/Time   WBC 6.7 07/03/2022 1344   WBC 8.8 05/28/2022 0401   RBC 3.64 (L) 07/03/2022 1344   HGB 9.7 (L) 07/03/2022 1344   HCT 31.6 (L) 07/03/2022 1344   PLT 297 07/03/2022 1344   MCV 86.8 07/03/2022 1344   MCH 26.6 07/03/2022 1344   MCHC 30.7 07/03/2022 1344   RDW 17.6 (H) 07/03/2022 1344   LYMPHSABS 1.0  07/03/2022 1344   MONOABS 0.4 07/03/2022 1344   EOSABS 0.2 07/03/2022 1344   BASOSABS 0.0 07/03/2022 1344    CMP     Component Value Date/Time   NA 139 07/03/2022 1344   K 3.6 07/03/2022 1344   CL 105 07/03/2022 1344   CO2 27 07/03/2022 1344   GLUCOSE 100 (H) 07/03/2022 1344   BUN 13 07/03/2022 1344   CREATININE 0.84 07/03/2022 1344   CALCIUM 8.8 (L) 07/03/2022 1344   PROT 6.7 07/03/2022 1344   ALBUMIN 3.5 07/03/2022 1344   AST 37 07/03/2022 1344   ALT 18 07/03/2022 1344   ALKPHOS 146 (H) 07/03/2022 1344   BILITOT 0.5 07/03/2022 1344   GFRNONAA >60 07/03/2022 1344   GFRAA >60 02/24/2020 1454   GFRAA >60 07/24/2019 1453   ASSESSMENT and THERAPY PLAN:   Suzanne [name] is a 62 y.o. female who presents to the clinic for follow up for metastatic breast cancer to the bone.    #Metastatic breast cancer involving the bone:   S/P first line Palbociclib  with AI. We started her on Faslodex while we were awaiting molecular testing. She was on Ibrance for almost 2 yrs. We have suspected that there could be some progression ongoing given her rising tumor markers and since she has been on this combination for almost 2+ years. Biopsy confirmed metastatic breast cancer ER, PR positive, Her 2 neg. 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 Other possible therapies include AKT inhibitor with fulvestrant for next line followed by single agent chemotherapy. She is agreeable to this plan. We will repeat imaging and if this has showed stable disease, we will continue orserdu  All questions were answered. The patient knows to call the clinic with any problems, questions or concerns. We can certainly see the patient much sooner if necessary.  Total encounter time 30 minutes*in face-to-face visit time, chart review, lab review, care coordination, order entry, and documentation of the encounter  time.  *Total Encounter Time as defined by the Centers for Medicare and Medicaid Services includes, in addition to the face-to-face time of a patient visit (documented in the note above) non-face-to-face time: obtaining and reviewing outside history, ordering and reviewing medications, tests or procedures, care coordination (communications with other health care professionals or caregivers) and documentation in the medical record.

## 2022-07-04 ENCOUNTER — Telehealth: Payer: Self-pay | Admitting: Hematology and Oncology

## 2022-07-04 DIAGNOSIS — R0602 Shortness of breath: Secondary | ICD-10-CM | POA: Diagnosis not present

## 2022-07-04 DIAGNOSIS — C50811 Malignant neoplasm of overlapping sites of right female breast: Secondary | ICD-10-CM | POA: Diagnosis not present

## 2022-07-04 DIAGNOSIS — J40 Bronchitis, not specified as acute or chronic: Secondary | ICD-10-CM | POA: Diagnosis not present

## 2022-07-04 DIAGNOSIS — C7801 Secondary malignant neoplasm of right lung: Secondary | ICD-10-CM | POA: Diagnosis not present

## 2022-07-04 DIAGNOSIS — J9621 Acute and chronic respiratory failure with hypoxia: Secondary | ICD-10-CM | POA: Diagnosis not present

## 2022-07-04 DIAGNOSIS — N19 Unspecified kidney failure: Secondary | ICD-10-CM | POA: Diagnosis not present

## 2022-07-04 DIAGNOSIS — J309 Allergic rhinitis, unspecified: Secondary | ICD-10-CM | POA: Diagnosis not present

## 2022-07-04 DIAGNOSIS — R6510 Systemic inflammatory response syndrome (SIRS) of non-infectious origin without acute organ dysfunction: Secondary | ICD-10-CM | POA: Diagnosis not present

## 2022-07-04 LAB — CANCER ANTIGEN 27.29: CA 27.29: 1119.6 U/mL — ABNORMAL HIGH (ref 0.0–38.6)

## 2022-07-04 NOTE — Telephone Encounter (Signed)
Spoke with patient confirming upcoming appointment 

## 2022-07-05 DIAGNOSIS — C50811 Malignant neoplasm of overlapping sites of right female breast: Secondary | ICD-10-CM | POA: Diagnosis not present

## 2022-07-09 ENCOUNTER — Ambulatory Visit: Payer: Medicaid Other | Admitting: Nurse Practitioner

## 2022-07-10 ENCOUNTER — Telehealth: Payer: Self-pay

## 2022-07-10 DIAGNOSIS — R059 Cough, unspecified: Secondary | ICD-10-CM | POA: Diagnosis not present

## 2022-07-10 DIAGNOSIS — B349 Viral infection, unspecified: Secondary | ICD-10-CM | POA: Diagnosis not present

## 2022-07-10 NOTE — Telephone Encounter (Signed)
Pt's daughter, [name] called to let us know pt has chest congestion/productive cough. Pt can't identify color of mucus as she "doesn't look at it."  She states sx onset was 07/01/22 and started with a sore throat. Mrs Olliver is having intermittent low grade fevers of 99.8-100.2. States she is using her nebulizer and taking tylenol. Advised to go to urgent care and urged for RVP to r/o COVID, flu, RSV.  Encouraged pt to keep appt for CT for now.  She verbalized thanks and understanding and knows to call with any further questions or concerns.

## 2022-07-11 ENCOUNTER — Telehealth: Payer: Self-pay

## 2022-07-11 DIAGNOSIS — C50811 Malignant neoplasm of overlapping sites of right female breast: Secondary | ICD-10-CM | POA: Diagnosis not present

## 2022-07-11 NOTE — Telephone Encounter (Signed)
Pt called to advise she was negative for flu/COVID and was prescribed tessalon perles. Per MD, advised to continue nebulizer PRN and OK to take mucinex. She verbalized thanks and understanding.

## 2022-07-12 ENCOUNTER — Other Ambulatory Visit: Payer: Medicaid Other

## 2022-07-12 ENCOUNTER — Ambulatory Visit (HOSPITAL_COMMUNITY)
Admission: RE | Admit: 2022-07-12 | Discharge: 2022-07-12 | Disposition: A | Discharge status: Home / Self Care | Payer: Medicaid Other | Source: Ambulatory Visit | Attending: Hematology and Oncology | Admitting: Hematology and Oncology

## 2022-07-12 ENCOUNTER — Other Ambulatory Visit: Payer: Self-pay | Admitting: Hematology and Oncology

## 2022-07-12 ENCOUNTER — Encounter (HOSPITAL_COMMUNITY): Payer: Self-pay

## 2022-07-12 DIAGNOSIS — C7951 Secondary malignant neoplasm of bone: Secondary | ICD-10-CM | POA: Insufficient documentation

## 2022-07-12 DIAGNOSIS — K8020 Calculus of gallbladder without cholecystitis without obstruction: Secondary | ICD-10-CM | POA: Diagnosis not present

## 2022-07-12 DIAGNOSIS — J479 Bronchiectasis, uncomplicated: Secondary | ICD-10-CM | POA: Diagnosis not present

## 2022-07-12 DIAGNOSIS — K769 Liver disease, unspecified: Secondary | ICD-10-CM | POA: Diagnosis not present

## 2022-07-12 DIAGNOSIS — C50919 Malignant neoplasm of unspecified site of unspecified female breast: Secondary | ICD-10-CM | POA: Diagnosis not present

## 2022-07-12 MED ORDER — IOHEXOL 300 MG/ML  SOLN
100.0000 mL | Freq: Once | INTRAMUSCULAR | Status: AC | PRN
  Administered 2022-07-12: 180 mL via INTRAVENOUS

## 2022-07-12 MED ORDER — SODIUM CHLORIDE (PF) 0.9 % IJ SOLN
INTRAMUSCULAR | Status: AC
  Filled 2022-07-12: qty 50

## 2022-07-12 NOTE — Progress Notes (Signed)
  Evaluation after Contrast Extravasation  Patient seen and examined immediately after contrast extravasation while in CT.  Exam: There is mild swelling at the RUE area.  There is no erythema. There is no discoloration. There are no blisters. There are no signs of decreased perfusion of the skin.  It is warm to touch.  The patient has full ROM in fingers.  Radial pulse is normal.  Per contrast extravasation protocol, I have instructed the patient to keep an ice pack on the area for 20-60 minutes at a time for about 48 hours.   Keep arm elevated as much as possible.   The patient understands to call the radiology department if there is: - increase in pain or swelling - changed or altered sensation - ulceration or blistering - increasing redness - warmth or increasing firmness - decreased tissue perfusion as noted by decreased capillary refill or discoloration of skin - decreased pulses peripheral to site   Tyson Alias, Rexford Maus 07/12/2022 12:24 PM

## 2022-07-12 NOTE — Progress Notes (Signed)
75 ml of omni 300 extravasated into right AC.  Per patient no complaints until after the scan was completed. Prior to injection of contrast IV was tested injected with 20 ml of saline flush and then an additional 25 ml of saline by the injector with no complaints by patient.   Patient seen by [facility] NP Narda Rutherford.  Outpatient instructions given to patient.  Per patient she stated she understood and would call if she had any problems.  Scan repeated with contrast.

## 2022-07-13 DIAGNOSIS — C50811 Malignant neoplasm of overlapping sites of right female breast: Secondary | ICD-10-CM | POA: Diagnosis not present

## 2022-07-16 ENCOUNTER — Inpatient Hospital Stay: Payer: Medicaid Other

## 2022-07-16 ENCOUNTER — Encounter: Payer: Self-pay | Admitting: Hematology and Oncology

## 2022-07-16 ENCOUNTER — Other Ambulatory Visit: Payer: Self-pay

## 2022-07-16 ENCOUNTER — Inpatient Hospital Stay (HOSPITAL_BASED_OUTPATIENT_CLINIC_OR_DEPARTMENT_OTHER): Payer: Medicaid Other | Admitting: Hematology and Oncology

## 2022-07-16 VITALS — BP 128/79 | HR 88 | Temp 97.6°F | Resp 16 | Ht <= 58 in | Wt 177.5 lb

## 2022-07-16 DIAGNOSIS — C7951 Secondary malignant neoplasm of bone: Secondary | ICD-10-CM | POA: Diagnosis not present

## 2022-07-16 DIAGNOSIS — J90 Pleural effusion, not elsewhere classified: Secondary | ICD-10-CM

## 2022-07-16 DIAGNOSIS — R93429 Abnormal radiologic findings on diagnostic imaging of unspecified kidney: Secondary | ICD-10-CM

## 2022-07-16 DIAGNOSIS — D518 Other vitamin B12 deficiency anemias: Secondary | ICD-10-CM

## 2022-07-16 DIAGNOSIS — Z170 Estrogen receptor positive status [ER+]: Secondary | ICD-10-CM

## 2022-07-16 LAB — CBC WITH DIFFERENTIAL (CANCER CENTER ONLY)
Abs Immature Granulocytes: 0.01 10*3/uL (ref 0.00–0.07)
Basophils Absolute: 0 10*3/uL (ref 0.0–0.1)
Basophils Relative: 1 %
Eosinophils Absolute: 0.2 10*3/uL (ref 0.0–0.5)
Eosinophils Relative: 3 %
HCT: 31.6 % — ABNORMAL LOW (ref 36.0–46.0)
Hemoglobin: 9.3 g/dL — ABNORMAL LOW (ref 12.0–15.0)
Immature Granulocytes: 0 %
Lymphocytes Relative: 13 %
Lymphs Abs: 0.7 10*3/uL (ref 0.7–4.0)
MCH: 25.6 pg — ABNORMAL LOW (ref 26.0–34.0)
MCHC: 29.4 g/dL — ABNORMAL LOW (ref 30.0–36.0)
MCV: 87.1 fL (ref 80.0–100.0)
Monocytes Absolute: 0.3 10*3/uL (ref 0.1–1.0)
Monocytes Relative: 6 %
Neutro Abs: 4 10*3/uL (ref 1.7–7.7)
Neutrophils Relative %: 77 %
Platelet Count: 280 10*3/uL (ref 150–400)
RBC: 3.63 MIL/uL — ABNORMAL LOW (ref 3.87–5.11)
RDW: 18.3 % — ABNORMAL HIGH (ref 11.5–15.5)
WBC Count: 5.2 10*3/uL (ref 4.0–10.5)
nRBC: 0 % (ref 0.0–0.2)

## 2022-07-16 LAB — URINALYSIS, COMPLETE (UACMP) WITH MICROSCOPIC
Glucose, UA: NEGATIVE mg/dL
Hgb urine dipstick: NEGATIVE
Ketones, ur: NEGATIVE mg/dL
Nitrite: NEGATIVE
Protein, ur: NEGATIVE mg/dL
Specific Gravity, Urine: 1.026 (ref 1.005–1.030)
pH: 5 (ref 5.0–8.0)

## 2022-07-16 LAB — VITAMIN B12: Vitamin B-12: 528 pg/mL (ref 180–914)

## 2022-07-16 LAB — CMP (CANCER CENTER ONLY)
ALT: 13 U/L (ref 0–44)
AST: 28 U/L (ref 15–41)
Albumin: 3.5 g/dL (ref 3.5–5.0)
Alkaline Phosphatase: 113 U/L (ref 38–126)
Anion gap: 4 — ABNORMAL LOW (ref 5–15)
BUN: 13 mg/dL (ref 8–23)
CO2: 31 mmol/L (ref 22–32)
Calcium: 8.2 mg/dL — ABNORMAL LOW (ref 8.9–10.3)
Chloride: 105 mmol/L (ref 98–111)
Creatinine: 0.77 mg/dL (ref 0.44–1.00)
GFR, Estimated: >60 mL/min (ref >60)
Glucose, Bld: 97 mg/dL (ref 70–99)
Potassium: 3.4 mmol/L — ABNORMAL LOW (ref 3.5–5.1)
Sodium: 140 mmol/L (ref 135–145)
Total Bilirubin: 0.4 mg/dL (ref 0.3–1.2)
Total Protein: 6.7 g/dL (ref 6.5–8.1)

## 2022-07-16 LAB — FERRITIN: Ferritin: 12 ng/mL (ref 11–307)

## 2022-07-16 NOTE — Progress Notes (Signed)
[facility] Follow up:   Suzanne Pounds, NP [address] [place] [address]  DIAGNOSIS: Metastatic breast cancer  SUMMARY OF ONCOLOGIC HISTORY:  Suzanne [name], [place] woman presenting 10/26/2018 with right-sided inflammatory breast cancer, stage IV, involving lungs, lymph nodes and bones, as follows:             (a) chest CT scan 10/26/2018 shows bilateral pleural effusions, possible lymphangitic spread of tumor, diffuse bony metastatic disease, and significant axillary mediastinal and hilar adenopathy             (b) bone scan 10/27/2018 is a "near SuperScan" consistent with widespread bony metastatic disease             (c) head CT with and without contrast 10/30/2018 shows no intracranial metastatic disease, multiple calvarial lesions             (d) CA-27-29 on 10/27/2018 was 1810.4   (1) pleural fluid from right thoracentesis 10/28/2018 confirms malignant cells consistent with a breast primary, strongly estrogen and progesterone receptor positive, HER-2 not amplified, with an MIB-1 of 2%   (2) anastrozole started 10/29/2018             (a) palbociclib started 11/13/2018 at 100 mg dose             (b) denosumab/xgeva started 11/13/2018              (C) palbociclib dose decreased to 19m             (D) Palbociclib dose decreased to 75 mg every other day in 11/2021   (3) associated problems:             (a) hypoxia secondary to effusions             (b) pain from bone lesions and left humeral and vertebral compression fractures             (c) right upper extremity lymphedema             (d) poor venous access   (4) genetics testing on 12/25/2018 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 STK11, TP53, TSC1, TSC2, and VHL.  The following genes were evaluated for sequence changes only: SDHA and HOXB13 c.251G>A variant only. The [facility] Melanoma Panel analyzed the following 9 genes: BAP1 BRCA2 CDK4 CDKN2A MITF POT1 PTEN RB1 Tp53.    (5) palliative radiation to the left humerus 07/20/2019 - 07/31/2019 Site Technique Total Dose (Gy) Dose per Fx (Gy) Completed Fx Beam Energies  Humerus, Left: Ext_Lt_humerus Complex 30/30 3 10/10 6X    (6) anemia requiring transfusion:              (a) B-12 level 134 FEB 2022: B-12 injectons started              (B) iron deficient, IV iron to start 2/6  (C) Hospitalized with upper GI bleed 05/27/2022-05/30/2022  (7) Elacestrant started 04/30/2022  INTERVAL HISTORY:  Suzanne [name]  Suzanne [name] 62 y.o. female returns for follow-up.   She had skin biopsy and this showed psoriasis. She however hasn't been started on treatment apparently.  She is here to review imaging results. She has read through imaging results and understands that the cancer is progressing She denies any symptoms today except for the cold and cough. She was tested for COVID and flu, negative.  She uses 1-1/2 L of oxygen at baseline and wondered if some of her cough could be related to the disease versus the lingering cold. Rest of the pertinent 10 point ROS reviewed and neg.   Patient Active Problem List   Diagnosis Date Noted   UGI bleed 05/27/2022   Acute prerenal azotemia 04/18/2022   Dehydration 04/18/2022   SIRS (systemic inflammatory response syndrome) (HCC) 04/18/2022   Allergic rhinitis 04/18/2022   Depression 04/18/2022   Anemia of chronic disease 04/18/2022   Bronchitis 04/18/2022   Acute bronchitis 04/17/2022    Intertrigo 01/10/2022   Bacterial vaginitis 01/10/2022   Sepsis due to cellulitis ([place]) 12/06/2021   B12 deficiency anemia 08/15/2020   Sensorineural hearing loss (SNHL) of both ears 08/07/2019   Intractable back pain 02/03/2019   Thoracic compression fracture, closed, initial encounter ([place]) 02/03/2019   Essential hypertension 02/03/2019   Genetic testing 01/27/2019   Acute pharyngitis    Bacteremia    FTT (failure to thrive) in adult    Fracture closed, humerus, shaft 01/09/2019   Pneumonia 12/25/2018   Acute respiratory disorder in immunocompromised patient ([place]) 12/25/2018   Family history of breast cancer    Family history of prostate cancer    Family history of melanoma    Family history of colon cancer    Pressure injury of skin 11/24/2018   Chronic respiratory failure with hypoxia and hypercapnia ([place]) 11/19/2018   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   Hypophosphatemia 11/19/2018   Hypocalcemia 11/19/2018   Aspiration pneumonia (HCC) 11/19/2018   SOB (shortness of breath)    Palliative care by specialist    Carcinoma of breast, estrogen receptor positive, stage 4, right ([place]) 11/16/2018   Hypoalbuminemia 11/16/2018   Pleural effusion 11/14/2018   Goals of care, counseling/discussion 11/06/2018   Malignant neoplasm of overlapping sites of right breast in female, estrogen receptor positive ([place]) 10/31/2018   Lung metastasis 10/31/2018   Pain from bone metastases ([place]) 10/31/2018   Class 2 obesity 10/31/2018   Bone injury    Pleural effusion, bilateral    Shortness of breath    Abnormal breast finding    Hypokalemia    Breast skin changes    Macrocytic anemia    Malignant neoplasm metastatic to bone (HCC)    Anemia, B12 deficiency 10/26/2018    is allergic to ferumoxytol.  MEDICAL HISTORY: Past Medical History:  Diagnosis Date   Cancer [facility])    Family history of breast cancer    Family history of colon cancer    Family history of melanoma    Family  history of prostate cancer    Gallstones    Glaucoma    Mastitis    reports history of recurrent mastitis   Metastatic breast cancer    Obesity     SURGICAL HISTORY: Past Surgical History:  Procedure Laterality Date   ESOPHAGOGASTRODUODENOSCOPY (EGD) WITH PROPOFOL N/A 05/27/2022   Procedure: ESOPHAGOGASTRODUODENOSCOPY (EGD) WITH PROPOFOL;  Surgeon: Arta Silence, MD;  Location: [facility] END[facility];  Service: Gastroenterology;  Laterality: N/A;   GLAUCOMA SURGERY  late 1990's   IR THORACENTESIS ASP PLEURAL SPACE W/IMG  GUIDE  10/28/2018   IR THORACENTESIS ASP PLEURAL SPACE W/IMG GUIDE  10/29/2018   OPEN REDUCTION INTERNAL FIXATION (ORIF) DISTAL RADIAL FRACTURE Left 01/09/2019   Procedure: OPEN REDUCTION INTERNAL FIXATION (ORIF) HUMERAL FRACTURE;  Surgeon: Netta Cedars, MD;  Location: [facility] ORS;  Service: Orthopedics;  Laterality: Left;    SOCIAL HISTORY: Social History   Socioeconomic History   Marital status: Widowed    Spouse name: Not on file   Number of children: 1   Years of education: Not on file   Highest education level: Not on file  Occupational History   Occupation: Clinical research associate Programs    Comment: Self-employed  Tobacco Use   Smoking status: Never   Smokeless tobacco: Never   Tobacco comments:    second hand smoke exposure  Vaping Use   Vaping Use: Never used  Substance and Sexual Activity   Alcohol use: No   Drug use: No   Sexual activity: Not Currently  Other Topics Concern   Not on file  Social History Narrative   Has a daughter named [name]. Daughter has CP.   Social Determinants of Health   Financial Resource Strain: Low Risk  (08/16/2021)   Overall Financial Resource Strain (CARDIA)    Difficulty of Paying Living Expenses: Not hard at all  Food Insecurity: No Food Insecurity (05/27/2022)   Hunger Vital Sign    Worried About Running Out of Food in the Last Year: Never true    Ran Out of Food in the Last Year: Never true  Transportation Needs: No  Transportation Needs (05/27/2022)   PRAPARE - Hydrologist (Medical): No    Lack of Transportation (Non-Medical): No  Physical Activity: Not on file  Stress: Stress Concern Present (11/08/2021)   [place]    Feeling of Stress : To some extent  Social Connections: Socially Isolated (03/27/2021)   Social Connection and Isolation Panel [NHANES]    Frequency of Communication with Friends and Family: Once a week    Frequency of Social Gatherings with Friends and Family: More than three times a week    Attends Religious Services: Never    Marine scientist or Organizations: No    Attends Archivist Meetings: Never    Marital Status: Widowed  Intimate Partner Violence: Not At Risk (05/27/2022)   Humiliation, Afraid, Rape, and Kick questionnaire    Fear of Current or Ex-Partner: No    Emotionally Abused: No    Physically Abused: No    Sexually Abused: No    FAMILY HISTORY: Family History  Problem Relation Age of Onset   Breast cancer Sister        in her 17's-70s   Heart disease Brother        CABG   Heart disease Brother        CABG   Skin cancer Brother        melanoma   Colon cancer Cousin    Heart attack Mother    Heart attack Father    Prostate cancer Brother     Review of Systems  Constitutional:  Negative for appetite change, chills, fatigue, fever and unexpected weight change.  HENT:   Negative for hearing loss, lump/mass and trouble swallowing.   Eyes:  Negative for eye problems and icterus.  Respiratory:  Positive for shortness of breath (improved back to baseline). Negative for chest tightness and cough.   Cardiovascular:  Negative for  chest pain, leg swelling and palpitations.  Gastrointestinal:  Negative for abdominal distention, abdominal pain, constipation, diarrhea, nausea and vomiting.  Endocrine: Negative for hot flashes.  Genitourinary:  Negative  for difficulty urinating.   Musculoskeletal:  Negative for arthralgias.  Skin:  Negative for itching and rash.  Neurological:  Negative for dizziness, extremity weakness, headaches and numbness.  Hematological:  Negative for adenopathy. Does not bruise/bleed easily.  Psychiatric/Behavioral:  Negative for depression. The patient is not nervous/anxious.       PHYSICAL EXAMINATION  ECOG PERFORMANCE STATUS: 2 - Symptomatic, <50% confined to bed  Vitals:   07/16/22 1146  BP: 128/79  Pulse: 88  Resp: 16  Temp: 97.6 F (36.4 C)  SpO2: 100%    Physical Exam Constitutional:      General: She is not in acute distress.    Appearance: Normal appearance. She is not toxic-appearing.  HENT:     Head: Normocephalic and atraumatic.  Eyes:     General: No scleral icterus. Cardiovascular:     Rate and Rhythm: Normal rate and regular rhythm.     Pulses: Normal pulses.     Heart sounds: Normal heart sounds.  Pulmonary:     Effort: Pulmonary effort is normal.     Breath sounds: Normal breath sounds.  Abdominal:     General: Abdomen is flat. Bowel sounds are normal. There is no distension.     Palpations: Abdomen is soft.     Tenderness: There is no abdominal tenderness.  Musculoskeletal:        General: No swelling.     Cervical back: Neck supple.  Lymphadenopathy:     Cervical: No cervical adenopathy.  Skin:    General: Skin is warm and dry.     Findings: Rash (persistent underneath right breast--improved according to the patient and daughter) present.  Neurological:     General: No focal deficit present.     Mental Status: She is alert.  Psychiatric:        Mood and Affect: Mood normal.        Behavior: Behavior normal.     LABORATORY DATA:  CBC    Component Value Date/Time   WBC 5.2 07/16/2022 1100   WBC 8.8 05/28/2022 0401   RBC 3.63 (L) 07/16/2022 1100   HGB 9.3 (L) 07/16/2022 1100   HCT 31.6 (L) 07/16/2022 1100   PLT 280 07/16/2022 1100   MCV 87.1 07/16/2022 1100    MCH 25.6 (L) 07/16/2022 1100   MCHC 29.4 (L) 07/16/2022 1100   RDW 18.3 (H) 07/16/2022 1100   LYMPHSABS 0.7 07/16/2022 1100   MONOABS 0.3 07/16/2022 1100   EOSABS 0.2 07/16/2022 1100   BASOSABS 0.0 07/16/2022 1100    CMP     Component Value Date/Time   NA 140 07/16/2022 1100   K 3.4 (L) 07/16/2022 1100   CL 105 07/16/2022 1100   CO2 31 07/16/2022 1100   GLUCOSE 97 07/16/2022 1100   BUN 13 07/16/2022 1100   CREATININE 0.77 07/16/2022 1100   CALCIUM 8.2 (L) 07/16/2022 1100   PROT 6.7 07/16/2022 1100   ALBUMIN 3.5 07/16/2022 1100   AST 28 07/16/2022 1100   ALT 13 07/16/2022 1100   ALKPHOS 113 07/16/2022 1100   BILITOT 0.4 07/16/2022 1100   GFRNONAA >60 07/16/2022 1100   GFRAA >60 02/24/2020 1454   GFRAA >60 07/24/2019 1453   ASSESSMENT and THERAPY PLAN:   Suzanne [name] is a 62 y.o. female who presents to the clinic  for follow up for metastatic breast cancer to the bone.    #Metastatic breast cancer involving the bone:   S/P first line Palbociclib with AI. We started her on Faslodex while we were awaiting molecular testing. She was on Ibrance for almost 2 yrs. We have suspected that there could be some progression ongoing given her rising tumor markers and since she has been on this combination for almost 2+ years. Biopsy confirmed metastatic breast cancer ER, PR positive, Her 2 neg. 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 She is now on orserdu, she had interruptions to treatment, took about a month, then had some hospitalization, treatment was also interrupted by delay in obtaining medication. At this time since patient is she had several interruptions with treatment, we have discussed about considering continuing this for another 2 months and reimage versus transitioning to oral chemotherapy Xeloda.  Given the duration  of response on Ibrance and AI, I wonder if she may have a good response to Orserdu as well.  She remains asymptomatic from her disease. We will continue to monitor her cough.  CBC, CMP and tumor marker every visit.  Okay to continue Xgeva every 28 days, patient denies any new dental issues. With regards to the caliectasis and possible thrombus in the kidney, have had a conversation with the radiologist and he suggested to follow-up with urine analysis to see if she has any hematuria.  We could also consider referral to urology if she has any persistent hematuria. I believe her overall prognosis is mostly guarded by the metastatic breast cancer hence we will consider referrals as needed  Total encounter time 30 minutes*in face-to-face visit time, chart review, lab review, care coordination, order entry, and documentation of the encounter time.  *Total Encounter Time as defined by the Centers for Medicare and Medicaid Services includes, in addition to the face-to-face time of a patient visit (documented in the note above) non-face-to-face time: obtaining and reviewing outside history, ordering and reviewing medications, tests or procedures, care coordination (communications with other health care professionals or caregivers) and documentation in the medical record.

## 2022-07-17 DIAGNOSIS — Z961 Presence of intraocular lens: Secondary | ICD-10-CM | POA: Diagnosis not present

## 2022-07-17 DIAGNOSIS — H40013 Open angle with borderline findings, low risk, bilateral: Secondary | ICD-10-CM | POA: Diagnosis not present

## 2022-07-17 DIAGNOSIS — H0102B Squamous blepharitis left eye, upper and lower eyelids: Secondary | ICD-10-CM | POA: Diagnosis not present

## 2022-07-17 DIAGNOSIS — H43813 Vitreous degeneration, bilateral: Secondary | ICD-10-CM | POA: Diagnosis not present

## 2022-07-17 DIAGNOSIS — H0102A Squamous blepharitis right eye, upper and lower eyelids: Secondary | ICD-10-CM | POA: Diagnosis not present

## 2022-07-17 DIAGNOSIS — C50811 Malignant neoplasm of overlapping sites of right female breast: Secondary | ICD-10-CM | POA: Diagnosis not present

## 2022-07-17 LAB — CANCER ANTIGEN 27.29: CA 27.29: 927.6 U/mL — ABNORMAL HIGH (ref 0.0–38.6)

## 2022-07-18 ENCOUNTER — Telehealth: Payer: Self-pay

## 2022-07-18 ENCOUNTER — Other Ambulatory Visit: Payer: Self-pay

## 2022-07-18 DIAGNOSIS — C50811 Malignant neoplasm of overlapping sites of right female breast: Secondary | ICD-10-CM | POA: Diagnosis not present

## 2022-07-18 DIAGNOSIS — Z170 Estrogen receptor positive status [ER+]: Secondary | ICD-10-CM

## 2022-07-18 DIAGNOSIS — R93429 Abnormal radiologic findings on diagnostic imaging of unspecified kidney: Secondary | ICD-10-CM

## 2022-07-18 NOTE — Telephone Encounter (Signed)
Followed up on a voicemail that the daughter [name] left regarding possible infection she is worried her mother has. [name] stated her mother has right breast pain, redness to right breast, headache, and chills. This LPN reached out to symptom management [facility]. Patients daughter [name] is aware of lab appointment for 12:30pm and 1:00pm symptom management appointment on 07/19/2022.

## 2022-07-18 NOTE — Progress Notes (Signed)
Referral entered to [facility] per MD

## 2022-07-19 ENCOUNTER — Inpatient Hospital Stay: Payer: Medicaid Other

## 2022-07-19 ENCOUNTER — Other Ambulatory Visit: Payer: Self-pay

## 2022-07-19 ENCOUNTER — Inpatient Hospital Stay (HOSPITAL_BASED_OUTPATIENT_CLINIC_OR_DEPARTMENT_OTHER): Payer: Medicaid Other | Admitting: Physician Assistant

## 2022-07-19 VITALS — BP 129/81 | HR 95 | Temp 98.6°F | Resp 16 | Wt 178.6 lb

## 2022-07-19 DIAGNOSIS — Z170 Estrogen receptor positive status [ER+]: Secondary | ICD-10-CM | POA: Diagnosis not present

## 2022-07-19 DIAGNOSIS — N61 Mastitis without abscess: Secondary | ICD-10-CM | POA: Diagnosis not present

## 2022-07-19 DIAGNOSIS — C50811 Malignant neoplasm of overlapping sites of right female breast: Secondary | ICD-10-CM

## 2022-07-19 DIAGNOSIS — C7951 Secondary malignant neoplasm of bone: Secondary | ICD-10-CM | POA: Diagnosis not present

## 2022-07-19 LAB — CMP (CANCER CENTER ONLY)
ALT: 13 U/L (ref 0–44)
AST: 28 U/L (ref 15–41)
Albumin: 3.5 g/dL (ref 3.5–5.0)
Alkaline Phosphatase: 109 U/L (ref 38–126)
Anion gap: 7 (ref 5–15)
BUN: 11 mg/dL (ref 8–23)
CO2: 26 mmol/L (ref 22–32)
Calcium: 8.1 mg/dL — ABNORMAL LOW (ref 8.9–10.3)
Chloride: 105 mmol/L (ref 98–111)
Creatinine: 0.69 mg/dL (ref 0.44–1.00)
GFR, Estimated: >60 mL/min (ref >60)
Glucose, Bld: 87 mg/dL (ref 70–99)
Potassium: 3.8 mmol/L (ref 3.5–5.1)
Sodium: 138 mmol/L (ref 135–145)
Total Bilirubin: 0.5 mg/dL (ref 0.3–1.2)
Total Protein: 6.9 g/dL (ref 6.5–8.1)

## 2022-07-19 LAB — CBC WITH DIFFERENTIAL (CANCER CENTER ONLY)
Abs Immature Granulocytes: 0.02 10*3/uL (ref 0.00–0.07)
Basophils Absolute: 0 10*3/uL (ref 0.0–0.1)
Basophils Relative: 0 %
Eosinophils Absolute: 0.1 10*3/uL (ref 0.0–0.5)
Eosinophils Relative: 1 %
HCT: 29.1 % — ABNORMAL LOW (ref 36.0–46.0)
Hemoglobin: 8.8 g/dL — ABNORMAL LOW (ref 12.0–15.0)
Immature Granulocytes: 0 %
Lymphocytes Relative: 12 %
Lymphs Abs: 0.9 10*3/uL (ref 0.7–4.0)
MCH: 25.9 pg — ABNORMAL LOW (ref 26.0–34.0)
MCHC: 30.2 g/dL (ref 30.0–36.0)
MCV: 85.6 fL (ref 80.0–100.0)
Monocytes Absolute: 0.4 10*3/uL (ref 0.1–1.0)
Monocytes Relative: 6 %
Neutro Abs: 5.8 10*3/uL (ref 1.7–7.7)
Neutrophils Relative %: 81 %
Platelet Count: 259 10*3/uL (ref 150–400)
RBC: 3.4 MIL/uL — ABNORMAL LOW (ref 3.87–5.11)
RDW: 18.5 % — ABNORMAL HIGH (ref 11.5–15.5)
WBC Count: 7.1 10*3/uL (ref 4.0–10.5)
nRBC: 0 % (ref 0.0–0.2)

## 2022-07-19 MED ORDER — AMOXICILLIN-POT CLAVULANATE 875-125 MG PO TABS: 1.0000 | ORAL_TABLET | Freq: Two times a day (BID) | ORAL | 0 refills | Status: AC

## 2022-07-19 NOTE — Progress Notes (Signed)
Symptom Management Consult note [place]    Patient Care Team: [name], NP as PCP - General (Nurse Practitioner) Netta Cedars, MD as Consulting Physician (Orthopedic Surgery) Melissa Montane, RN as Case Manager Ethelda Chick as Social Worker Benay Pike, MD as Consulting Physician (Hematology and Oncology)    Name / MRN / DOB: Suzanne [name]  [id number]  1960-05-30   Date of visit: 07/19/2022   Chief Complaint/Reason for visit: breast pain   Current Therapy: augmentin and xgeva  Last treatment:  Treatment 56 on 07/03/22   ASSESSMENT & PLAN: Patient is a 62 y.o. 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 Chryl Heck.  I have viewed most recent oncology note and lab work.    #Metastatic stage IV right inflammatory breast cancer  - Next appointment with oncologist is 07/31/22   #Right breast cellulitis -Patient is nontoxic-appearing.  Afebrile, hemodynamically stable. -CBC without leukocytosis. CMP overall unremarkable. -Exam is concerning for breast cellulitis.  Patient has had similar infections in the past.  No palpable masses and no abscess appreciated on breast exam.   -Prescription for Augmentin sent to the pharmacy.  I clarified with pharmacist there are no drug interactions with her p.o. chemotherapy. -Patient offered appointment next week for recheck however she prefers to call and schedule appointment only if needed. -Strict ED precautions discussed with patient and her daughter. Discussed symptoms concerning for sepsis at length.     Heme/Onc History: Oncology History  Malignant neoplasm metastatic to bone [facility])      Interval history-: Suzanne [name] is a 62 y.o. female with oncologic history as above presenting to [facility] today with chief complaint of right breast pain and redness x 2 days.  She is accompanied to clinic by her daughter who provides additional  history. Patient states when she woke up yesterday morning she noticed that her right breast was red and warm to the touch.  She had localized aching pain that she rated 4 out of 10 in severity.  She had chills and checked her temperature, Tmax was 99.7.  Patient took Tylenol and pain completely resolved.  She is currently pain-free.  She admits the area of redness has decreased in size since onset.  She reports history of similar infection in the past requiring antibiotics.  Denies any changes to her nipple. She denies any shortness of breath.      ROS  All other systems are reviewed and are negative for acute change except as noted in the HPI.    Allergies  Allergen Reactions   Ferumoxytol Other (See Comments)    Pt had hypersensitivity to Ferumoxytol. She had Facial Flushing. See note from 07/14/2021     Past Medical History:  Diagnosis Date   Cancer [facility])    Family history of breast cancer    Family history of colon cancer    Family history of melanoma    Family history of prostate cancer    Gallstones    Glaucoma    Mastitis    reports history of recurrent mastitis   Metastatic breast cancer    Obesity      Past Surgical History:  Procedure Laterality Date   ESOPHAGOGASTRODUODENOSCOPY (EGD) WITH PROPOFOL N/A 05/27/2022   Procedure: ESOPHAGOGASTRODUODENOSCOPY (EGD) WITH PROPOFOL;  Surgeon: Arta Silence, MD;  Location: [facility];  Service: Gastroenterology;  Laterality: N/A;   GLAUCOMA SURGERY  late 1990's   IR  THORACENTESIS ASP PLEURAL SPACE W/IMG GUIDE  10/28/2018   IR THORACENTESIS ASP PLEURAL SPACE W/IMG GUIDE  10/29/2018   OPEN REDUCTION INTERNAL FIXATION (ORIF) DISTAL RADIAL FRACTURE Left 01/09/2019   Procedure: OPEN REDUCTION INTERNAL FIXATION (ORIF) HUMERAL FRACTURE;  Surgeon: Netta Cedars, MD;  Location: [facility] OR[facility];  Service: Orthopedics;  Laterality: Left;    Social History   Socioeconomic History   Marital status: Widowed    Spouse name: Not on file    Number of children: 1   Years of education: Not on file   Highest education level: Not on file  Occupational History   Occupation: Clinical research associate Programs    Comment: Self-employed  Tobacco Use   Smoking status: Never   Smokeless tobacco: Never   Tobacco comments:    second hand smoke exposure  Vaping Use   Vaping Use: Never used  Substance and Sexual Activity   Alcohol use: No   Drug use: No   Sexual activity: Not Currently  Other Topics Concern   Not on file  Social History Narrative   Has a daughter named [name]. Daughter has CP.   Social Determinants of Health   Financial Resource Strain: Low Risk  (08/16/2021)   Overall Financial Resource Strain (CARDIA)    Difficulty of Paying Living Expenses: Not hard at all  Food Insecurity: No Food Insecurity (05/27/2022)   Hunger Vital Sign    Worried About Running Out of Food in the Last Year: Never true    Ran Out of Food in the Last Year: Never true  Transportation Needs: No Transportation Needs (05/27/2022)   PRAPARE - Hydrologist (Medical): No    Lack of Transportation (Non-Medical): No  Physical Activity: Not on file  Stress: Stress Concern Present (11/08/2021)   [place]    Feeling of Stress : To some extent  Social Connections: Socially Isolated (03/27/2021)   Social Connection and Isolation Panel [NHANES]    Frequency of Communication with Friends and Family: Once a week    Frequency of Social Gatherings with Friends and Family: More than three times a week    Attends Religious Services: Never    Marine scientist or Organizations: No    Attends Archivist Meetings: Never    Marital Status: Widowed  Intimate Partner Violence: Not At Risk (05/27/2022)   Humiliation, Afraid, Rape, and Kick questionnaire    Fear of Current or Ex-Partner: No    Emotionally Abused: No    Physically Abused: No    Sexually  Abused: No    Family History  Problem Relation Age of Onset   Breast cancer Sister        in her 4's-70s   Heart disease Brother        CABG   Heart disease Brother        CABG   Skin cancer Brother        melanoma   Colon cancer Cousin    Heart attack Mother    Heart attack Father    Prostate cancer Brother      Current Outpatient Medications:    amoxicillin-clavulanate (AUGMENTIN) 875-125 MG tablet, Take 1 tablet by mouth 2 (two) times daily for 7 days., Disp: 14 tablet, Rfl: 0   traMADol (ULTRAM) 50 MG tablet, Take 1 tablet (50 mg total) by mouth every 6 (six) hours as needed., Disp: 30 tablet, Rfl: 0   acetaminophen (TYLENOL) 500 MG  tablet, Take 1,000 mg by mouth daily as needed for mild pain., Disp: , Rfl:    cetirizine (ZYRTEC) 10 MG tablet, Take 10 mg by mouth daily., Disp: , Rfl:    Denosumab (XGEVA [place]), Inject 1 Dose into the skin every 30 (thirty) days., Disp: , Rfl:    elacestrant hydrochloride (ORSERDU) 345 MG tablet, Take 1 tablet (345 mg total) by mouth daily. Take with food., Disp: 30 tablet, Rfl: 3   famotidine (PEPCID) 20 MG tablet, Take 1 tablet (20 mg total) by mouth 2 (two) times daily as needed for heartburn or indigestion. (Patient taking differently: Take 20 mg by mouth daily as needed for heartburn or indigestion.), Disp: 30 tablet, Rfl: 3   folic acid (FOLVITE) 1 MG tablet, Take 1 tablet (1 mg total) by mouth daily., Disp: 90 tablet, Rfl: 4   furosemide (LASIX) 20 MG tablet, Take 1 tablet (20 mg total) by mouth daily., Disp: 90 tablet, Rfl: 4   ipratropium-albuterol (DUONEB) 0.5-2.5 (3) MG/3ML SOLN, Take 3 mLs by nebulization every 6 (six) hours as needed., Disp: 360 mL, Rfl: 0   loperamide (IMODIUM) 2 MG capsule, Take 2 mg by mouth daily as needed for diarrhea or loose stools., Disp: , Rfl:    MELATONIN TR 1 MG TBCR, TAKE ONE TABLET BY MOUTH EVERYDAY AT BEDTIME (Patient taking differently: Take 1 mg by mouth at bedtime.), Disp: 90 tablet, Rfl: 4    ondansetron (ZOFRAN) 8 MG tablet, Take 1 tablet (8 mg total) by mouth every 8 (eight) hours as needed for nausea or vomiting. (Patient taking differently: Take 8 mg by mouth daily as needed for nausea or vomiting.), Disp: 20 tablet, Rfl: 1   OYSTER SHELL CALCIUM PLUS D 500-5 MG-MCG TABS, TAKE ONE TABLET BY MOUTH EVERY MORNING and TAKE ONE TABLET BY MOUTH AT NOON and TAKE ONE TABLET BY MOUTH EVERY EVENING (Patient taking differently: Take 1 tablet by mouth 2 (two) times daily.), Disp: 90 tablet, Rfl: 1   pantoprazole (PROTONIX) 40 MG tablet, Take 1 tablet (40 mg total) by mouth 2 (two) times daily., Disp: 60 tablet, Rfl: 2   polyethylene glycol (MIRALAX / GLYCOLAX) 17 g packet, Take 17 g by mouth as needed. (Patient taking differently: Take 17 g by mouth daily as needed for mild constipation.), Disp: 14 each, Rfl: 6   sertraline (ZOLOFT) 50 MG tablet, TAKE 1 AND 1/2 TABLETS BY MOUTH EVERYDAY AT BEDTIME (Patient taking differently: Take 75 mg by mouth at bedtime. When able to remember), Disp: 45 tablet, Rfl: 0   triamcinolone ointment (KENALOG) 0.5 %, Apply 1 Application topically 2 (two) times daily. (Patient taking differently: Apply 1 Application topically daily as needed (rash).), Disp: 30 g, Rfl: 0  PHYSICAL EXAM: ECOG FS:1 - Symptomatic but completely ambulatory    Vitals:   07/19/22 1320  BP: 129/81  Pulse: 95  Resp: 16  Temp: 98.6 F (37 C)  TempSrc: Oral  SpO2: 98%  Weight: 178 lb 9.6 oz (81 kg)   Physical Exam Vitals and nursing note reviewed.  Constitutional:      Appearance: She is well-developed. She is not ill-appearing or toxic-appearing.  HENT:     Head: Normocephalic.     Nose: Nose normal.  Eyes:     Conjunctiva/sclera: Conjunctivae normal.  Neck:     Vascular: No JVD.  Cardiovascular:     Rate and Rhythm: Normal rate and regular rhythm.     Pulses: Normal pulses.     Heart sounds: Normal  heart sounds.  Pulmonary:     Effort: Pulmonary effort is normal.      Breath sounds: Normal breath sounds.     Comments: Wearing nasal cannula.  Chest:     Comments: Please see media below.    Right breast is erythematous with warmth. No tenderness. No masses or abscess palpated.  Abdominal:     General: There is no distension.  Musculoskeletal:     Cervical back: Normal range of motion.  Skin:    General: Skin is warm and dry.  Neurological:     Mental Status: She is oriented to person, place, and time.        LABORATORY DATA: I have reviewed the data as listed    Latest Ref Rng & Units 07/19/2022   12:56 PM 07/16/2022   11:00 AM 07/03/2022    1:44 PM  CBC  WBC 4.0 - 10.5 K/uL 7.1  5.2  6.7   Hemoglobin 12.0 - 15.0 g/dL 8.8  9.3  9.7   Hematocrit 36.0 - 46.0 % 29.1  31.6  31.6   Platelets 150 - 400 K/uL 259  280  297         Latest Ref Rng & Units 07/19/2022   12:56 PM 07/16/2022   11:00 AM 07/03/2022    1:44 PM  CMP  Glucose 70 - 99 mg/dL 87  97  100   BUN 8 - 23 mg/dL 11  13  13   $ Creatinine 0.44 - 1.00 mg/dL 0.69  0.77  0.84   Sodium 135 - 145 mmol/L 138  140  139   Potassium 3.5 - 5.1 mmol/L 3.8  3.4  3.6   Chloride 98 - 111 mmol/L 105  105  105   CO2 22 - 32 mmol/L 26  31  27   $ Calcium 8.9 - 10.3 mg/dL 8.1  8.2  8.8   Total Protein 6.5 - 8.1 g/dL 6.9  6.7  6.7   Total Bilirubin 0.3 - 1.2 mg/dL 0.5  0.4  0.5   Alkaline Phos 38 - 126 U/L 109  113  146   AST 15 - 41 U/L 28  28  37   ALT 0 - 44 U/L 13  13  18        $ RADIOGRAPHIC STUDIES (from last 24 hours if applicable) I have personally reviewed the radiological images as listed and agreed with the findings in the report. No results found.      Visit Diagnosis: 1. Malignant neoplasm of overlapping sites of right breast in female, estrogen receptor positive ([place])   2. Cellulitis of right breast      No orders of the defined types were placed in this encounter.   All questions were answered. The patient knows to call the clinic with any problems, questions or  concerns. No barriers to learning was detected.  I have spent a total of 30 minutes minutes of face-to-face and non-face-to-face time, preparing to see the patient, obtaining and/or reviewing separately obtained history, performing a medically appropriate examination, counseling and educating the patient, ordering tests, documenting clinical information in the electronic health record, and care coordination (communications with other health care professionals or caregivers).    Thank you for allowing me to participate in the care of this patient.    Barrie Folk, PA-C Department of Hematology/Oncology [facility] Phone: [phone]  [phone]    07/19/2022 3:51 PM

## 2022-07-20 DIAGNOSIS — C50811 Malignant neoplasm of overlapping sites of right female breast: Secondary | ICD-10-CM | POA: Diagnosis not present

## 2022-07-23 DIAGNOSIS — C50811 Malignant neoplasm of overlapping sites of right female breast: Secondary | ICD-10-CM | POA: Diagnosis not present

## 2022-07-24 ENCOUNTER — Ambulatory Visit: Payer: Medicaid Other | Admitting: Hematology and Oncology

## 2022-07-24 ENCOUNTER — Other Ambulatory Visit: Payer: Medicaid Other

## 2022-07-24 DIAGNOSIS — C50811 Malignant neoplasm of overlapping sites of right female breast: Secondary | ICD-10-CM | POA: Diagnosis not present

## 2022-07-25 DIAGNOSIS — C50811 Malignant neoplasm of overlapping sites of right female breast: Secondary | ICD-10-CM | POA: Diagnosis not present

## 2022-07-26 DIAGNOSIS — C50811 Malignant neoplasm of overlapping sites of right female breast: Secondary | ICD-10-CM | POA: Diagnosis not present

## 2022-07-27 DIAGNOSIS — R6510 Systemic inflammatory response syndrome (SIRS) of non-infectious origin without acute organ dysfunction: Secondary | ICD-10-CM | POA: Diagnosis not present

## 2022-07-27 DIAGNOSIS — C7801 Secondary malignant neoplasm of right lung: Secondary | ICD-10-CM | POA: Diagnosis not present

## 2022-07-27 DIAGNOSIS — J309 Allergic rhinitis, unspecified: Secondary | ICD-10-CM | POA: Diagnosis not present

## 2022-07-27 DIAGNOSIS — R0602 Shortness of breath: Secondary | ICD-10-CM | POA: Diagnosis not present

## 2022-07-27 DIAGNOSIS — J40 Bronchitis, not specified as acute or chronic: Secondary | ICD-10-CM | POA: Diagnosis not present

## 2022-07-27 DIAGNOSIS — N19 Unspecified kidney failure: Secondary | ICD-10-CM | POA: Diagnosis not present

## 2022-07-27 DIAGNOSIS — J9621 Acute and chronic respiratory failure with hypoxia: Secondary | ICD-10-CM | POA: Diagnosis not present

## 2022-07-30 ENCOUNTER — Other Ambulatory Visit: Payer: Self-pay

## 2022-07-30 DIAGNOSIS — Z170 Estrogen receptor positive status [ER+]: Secondary | ICD-10-CM

## 2022-07-30 DIAGNOSIS — C50811 Malignant neoplasm of overlapping sites of right female breast: Secondary | ICD-10-CM | POA: Diagnosis not present

## 2022-07-31 ENCOUNTER — Encounter: Payer: Self-pay | Admitting: Adult Health

## 2022-07-31 ENCOUNTER — Other Ambulatory Visit: Payer: Self-pay

## 2022-07-31 ENCOUNTER — Inpatient Hospital Stay: Payer: Medicaid Other | Admitting: Hematology and Oncology

## 2022-07-31 ENCOUNTER — Inpatient Hospital Stay: Payer: Medicaid Other | Attending: Adult Health

## 2022-07-31 ENCOUNTER — Telehealth: Payer: Self-pay | Admitting: Hematology and Oncology

## 2022-07-31 ENCOUNTER — Inpatient Hospital Stay (HOSPITAL_BASED_OUTPATIENT_CLINIC_OR_DEPARTMENT_OTHER): Payer: Medicaid Other | Admitting: Adult Health

## 2022-07-31 ENCOUNTER — Inpatient Hospital Stay: Payer: Medicaid Other

## 2022-07-31 VITALS — BP 118/56 | HR 94 | Temp 98.6°F | Resp 14 | Wt 173.6 lb

## 2022-07-31 DIAGNOSIS — C50811 Malignant neoplasm of overlapping sites of right female breast: Secondary | ICD-10-CM

## 2022-07-31 DIAGNOSIS — Z170 Estrogen receptor positive status [ER+]: Secondary | ICD-10-CM

## 2022-07-31 DIAGNOSIS — C7951 Secondary malignant neoplasm of bone: Secondary | ICD-10-CM

## 2022-07-31 LAB — CMP (CANCER CENTER ONLY)
ALT: 13 U/L (ref 0–44)
AST: 29 U/L (ref 15–41)
Albumin: 3.6 g/dL (ref 3.5–5.0)
Alkaline Phosphatase: 110 U/L (ref 38–126)
Anion gap: 7 (ref 5–15)
BUN: 13 mg/dL (ref 8–23)
CO2: 27 mmol/L (ref 22–32)
Calcium: 8.6 mg/dL — ABNORMAL LOW (ref 8.9–10.3)
Chloride: 106 mmol/L (ref 98–111)
Creatinine: 0.89 mg/dL (ref 0.44–1.00)
GFR, Estimated: >60 mL/min (ref >60)
Glucose, Bld: 100 mg/dL — ABNORMAL HIGH (ref 70–99)
Potassium: 3.9 mmol/L (ref 3.5–5.1)
Sodium: 140 mmol/L (ref 135–145)
Total Bilirubin: 0.4 mg/dL (ref 0.3–1.2)
Total Protein: 6.9 g/dL (ref 6.5–8.1)

## 2022-07-31 LAB — CBC WITH DIFFERENTIAL (CANCER CENTER ONLY)
Abs Immature Granulocytes: 0.01 10*3/uL (ref 0.00–0.07)
Basophils Absolute: 0 10*3/uL (ref 0.0–0.1)
Basophils Relative: 1 %
Eosinophils Absolute: 0.2 10*3/uL (ref 0.0–0.5)
Eosinophils Relative: 3 %
HCT: 29.9 % — ABNORMAL LOW (ref 36.0–46.0)
Hemoglobin: 9.1 g/dL — ABNORMAL LOW (ref 12.0–15.0)
Immature Granulocytes: 0 %
Lymphocytes Relative: 16 %
Lymphs Abs: 0.9 10*3/uL (ref 0.7–4.0)
MCH: 26.1 pg (ref 26.0–34.0)
MCHC: 30.4 g/dL (ref 30.0–36.0)
MCV: 85.7 fL (ref 80.0–100.0)
Monocytes Absolute: 0.4 10*3/uL (ref 0.1–1.0)
Monocytes Relative: 7 %
Neutro Abs: 4 10*3/uL (ref 1.7–7.7)
Neutrophils Relative %: 73 %
Platelet Count: 265 10*3/uL (ref 150–400)
RBC: 3.49 MIL/uL — ABNORMAL LOW (ref 3.87–5.11)
RDW: 17.9 % — ABNORMAL HIGH (ref 11.5–15.5)
WBC Count: 5.4 10*3/uL (ref 4.0–10.5)
nRBC: 0 % (ref 0.0–0.2)

## 2022-07-31 LAB — VITAMIN B12: Vitamin B-12: 442 pg/mL (ref 180–914)

## 2022-07-31 MED ORDER — DENOSUMAB 120 MG/1.7ML ~~LOC~~ SOLN
120.0000 mg | Freq: Once | SUBCUTANEOUS | Status: AC
  Administered 2022-07-31: 120 mg via SUBCUTANEOUS
  Filled 2022-07-31: qty 1.7

## 2022-07-31 NOTE — Progress Notes (Unsigned)
[facility] Follow up:    [name], NP [address] [place] [address]   DIAGNOSIS: Stage IV breast cancer  SUMMARY OF ONCOLOGIC HISTORY: [place], [place] woman presenting 10/26/2018 with right-sided inflammatory breast cancer, stage IV, involving lungs, lymph nodes and bones, as follows:             (a) chest CT scan 10/26/2018 shows bilateral pleural effusions, possible lymphangitic spread of tumor, diffuse bony metastatic disease, and significant axillary mediastinal and hilar adenopathy             (b) bone scan 10/27/2018 is a "near [place]" consistent with widespread bony metastatic disease             (c) head CT with and without contrast 10/30/2018 shows no intracranial metastatic disease, multiple calvarial lesions             (d) CA-27-29 on 10/27/2018 was 1810.4   (1) pleural fluid from right thoracentesis 10/28/2018 confirms malignant cells consistent with a breast primary, strongly estrogen and progesterone receptor positive, HER-2 not amplified, with an MIB-1 of 2%   (2) anastrozole started 10/29/2018             (a) palbociclib started 11/13/2018 at 100 mg dose             (b) denosumab/xgeva started 11/13/2018              (C) palbociclib dose decreased to '75mg'$              (D) Palbociclib dose decreased to 75 mg every other day in 11/2021   (3) associated problems:             (a) hypoxia secondary to effusions             (b) pain from bone lesions and left humeral and vertebral compression fractures             (c) right upper extremity lymphedema             (d) poor venous access   (4) genetics testing on 12/25/2018 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 STK11, TP53, TSC1, TSC2, and VHL.  The following genes were evaluated for sequence changes only: SDHA and HOXB13 c.251G>A variant only. The [facility] Melanoma Panel analyzed the following 9 genes: BAP1 BRCA2 CDK4 CDKN2A MITF POT1 PTEN RB1 Tp53.    (5) palliative radiation to the left humerus 07/20/2019 - 07/31/2019 Site Technique Total Dose (Gy) Dose per Fx (Gy) Completed Fx Beam Energies  Humerus, Left: Ext_Lt_humerus Complex 30/30 3 10/10 6X    (6) anemia requiring transfusion:              (a) B-12 level 134 FEB 2022: B-12 injectons started              (B) iron deficient, IV iron to start 2/6             (C) Hospitalized with upper GI  bleed 05/27/2022-05/30/2022   (7) Elacestrant started 04/30/2022  CURRENT THERAPY: Elacestrant; Delton See  INTERVAL HISTORY: KIMEKO HOFMANN 62 y.o. female returns for follow-up of her metastatic breast cancer.  She continues on the [place] with good tolerance.  She notes that her skin rash is worsening.  She denies any significant issues otherwise.  She was seen by symptom management about 2 weeks ago for breast cellulitis.  She was put on antibiotics and this has resolved.   Patient Active Problem List   Diagnosis Date Noted   UGI bleed 05/27/2022   Acute prerenal azotemia 04/18/2022   Dehydration 04/18/2022   SIRS (systemic inflammatory response syndrome) (HCC) 04/18/2022   Allergic rhinitis 04/18/2022   Depression 04/18/2022   Anemia of chronic disease 04/18/2022   Bronchitis 04/18/2022   Acute bronchitis 04/17/2022   Intertrigo 01/10/2022   Bacterial vaginitis 01/10/2022   Sepsis due to cellulitis ([place]) 12/06/2021   B12 deficiency anemia 08/15/2020   Sensorineural  hearing loss (SNHL) of both ears 08/07/2019   Intractable back pain 02/03/2019   Thoracic compression fracture, closed, initial encounter ([place]) 02/03/2019   Essential hypertension 02/03/2019   Genetic testing 01/27/2019   Acute pharyngitis    Bacteremia    FTT (failure to thrive) in adult    Fracture closed, humerus, shaft 01/09/2019   Pneumonia 12/25/2018   Acute respiratory disorder in immunocompromised patient ([place]) 12/25/2018   Family history of breast cancer    Family history of prostate cancer    Family history of melanoma    Family history of colon cancer    Pressure injury of skin 11/24/2018   Chronic respiratory failure with hypoxia and hypercapnia ([place]) 11/19/2018   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) 10/31/2018   Lung metastasis 10/31/2018   Pain from bone metastases ([place]) 10/31/2018   Class 2 obesity 10/31/2018   Bone injury    Pleural effusion, bilateral    Shortness of breath    Abnormal breast finding    Hypokalemia    Breast skin changes    Macrocytic anemia    Malignant neoplasm metastatic to bone (HCC)    Anemia, B12 deficiency 10/26/2018    is allergic to ferumoxytol.  MEDICAL HISTORY: Past Medical History:  Diagnosis Date   Cancer [facility])    Family history of breast cancer    Family history of colon cancer    Family history of melanoma    Family history of prostate cancer    Gallstones    Glaucoma    Mastitis    reports history of recurrent mastitis   Metastatic breast cancer    Obesity      SURGICAL HISTORY: Past Surgical History:  Procedure Laterality Date   ESOPHAGOGASTRODUODENOSCOPY (EGD) WITH PROPOFOL N/A 05/27/2022   Procedure: ESOPHAGOGASTRODUODENOSCOPY (EGD) WITH PROPOFOL;  Surgeon: Arta Silence, MD;  Location: [facility];  Service: Gastroenterology;  Laterality: N/A;   GLAUCOMA SURGERY  late 1990's   IR THORACENTESIS ASP PLEURAL SPACE W/IMG GUIDE  10/28/2018   IR THORACENTESIS ASP PLEURAL SPACE W/IMG GUIDE  10/29/2018   OPEN REDUCTION INTERNAL FIXATION (  ORIF) DISTAL RADIAL FRACTURE Left 01/09/2019   Procedure: OPEN REDUCTION INTERNAL FIXATION (ORIF) HUMERAL FRACTURE;  Surgeon: Netta Cedars, MD;  Location: [facility];  Service: Orthopedics;  Laterality: Left;    SOCIAL HISTORY: Social History   Socioeconomic History   Marital status: Widowed    Spouse name: Not on file   Number of children: 1   Years of education: Not on file   Highest education level: Not on file  Occupational History   Occupation: Clinical research associate Programs    Comment: Self-employed  Tobacco Use   Smoking status: Never   Smokeless tobacco: Never   Tobacco comments:    second hand smoke exposure  Vaping Use   Vaping Use: Never used  Substance and Sexual Activity   Alcohol use: No   Drug use: No   Sexual activity: Not Currently  Other Topics Concern   Not on file  Social History Narrative   Has a daughter named [name]. Daughter has CP.   Social Determinants of Health   Financial Resource Strain: Low Risk  (08/16/2021)   Overall Financial Resource Strain (CARDIA)    Difficulty of Paying Living Expenses: Not hard at all  Food Insecurity: No Food Insecurity (05/27/2022)   Hunger Vital Sign    Worried About Running Out of Food in the Last Year: Never true    Ran Out of Food in the Last Year: Never true  Transportation Needs: No Transportation Needs (05/27/2022)   PRAPARE - Hydrologist (Medical): No    Lack of Transportation (Non-Medical): No   Physical Activity: Not on file  Stress: Stress Concern Present (11/08/2021)   [place]    Feeling of Stress : To some extent  Social Connections: Socially Isolated (03/27/2021)   Social Connection and Isolation Panel [NHANES]    Frequency of Communication with Friends and Family: Once a week    Frequency of Social Gatherings with Friends and Family: More than three times a week    Attends Religious Services: Never    Marine scientist or Organizations: No    Attends Archivist Meetings: Never    Marital Status: Widowed  Intimate Partner Violence: Not At Risk (05/27/2022)   Humiliation, Afraid, Rape, and Kick questionnaire    Fear of Current or Ex-Partner: No    Emotionally Abused: No    Physically Abused: No    Sexually Abused: No    FAMILY HISTORY: Family History  Problem Relation Age of Onset   Breast cancer Sister        in her 8's-70s   Heart disease Brother        CABG   Heart disease Brother        CABG   Skin cancer Brother        melanoma   Colon cancer Cousin    Heart attack Mother    Heart attack Father    Prostate cancer Brother     Review of Systems  Constitutional:  Negative for appetite change, chills, fatigue, fever and unexpected weight change.  HENT:   Negative for hearing loss, lump/mass and trouble swallowing.   Eyes:  Negative for eye problems and icterus.  Respiratory:  Negative for chest tightness, cough and shortness of breath.   Cardiovascular:  Negative for chest pain, leg swelling and palpitations.  Gastrointestinal:  Negative for abdominal distention, abdominal pain, constipation, diarrhea, nausea and vomiting.  Endocrine: Negative for hot flashes.  Genitourinary:  Negative for difficulty urinating.   Musculoskeletal:  Negative for arthralgias.  Skin:  Positive for rash. Negative for itching.  Neurological:  Negative for dizziness, extremity weakness,  headaches and numbness.  Hematological:  Negative for adenopathy. Does not bruise/bleed easily.  Psychiatric/Behavioral:  Negative for depression. The patient is not nervous/anxious.       PHYSICAL EXAMINATION  ECOG PERFORMANCE STATUS: 2 - Symptomatic, <50% confined to bed  Vitals:   07/31/22 1539  BP: (!) 118/56  Pulse: 94  Resp: 14  Temp: 98.6 F (37 C)  SpO2: 100%    Physical Exam Constitutional:      General: She is not in acute distress.    Appearance: Normal appearance. She is not toxic-appearing.  HENT:     Head: Normocephalic and atraumatic.  Eyes:     General: No scleral icterus. Cardiovascular:     Rate and Rhythm: Normal rate and regular rhythm.     Pulses: Normal pulses.     Heart sounds: Normal heart sounds.  Pulmonary:     Effort: Pulmonary effort is normal.     Breath sounds: Normal breath sounds.  Abdominal:     General: Abdomen is flat. Bowel sounds are normal. There is no distension.     Palpations: Abdomen is soft.     Tenderness: There is no abdominal tenderness.  Musculoskeletal:        General: No swelling.     Cervical back: Neck supple.  Lymphadenopathy:     Cervical: No cervical adenopathy.  Skin:    General: Skin is warm and dry.     Findings: Rash (Related to psoriasis) present.  Neurological:     General: No focal deficit present.     Mental Status: She is alert.  Psychiatric:        Mood and Affect: Mood normal.        Behavior: Behavior normal.     LABORATORY DATA:  CBC    Component Value Date/Time   WBC 5.4 07/31/2022 1518   WBC 8.8 05/28/2022 0401   RBC 3.49 (L) 07/31/2022 1518   HGB 9.1 (L) 07/31/2022 1518   HCT 29.9 (L) 07/31/2022 1518   PLT 265 07/31/2022 1518   MCV 85.7 07/31/2022 1518   MCH 26.1 07/31/2022 1518   MCHC 30.4 07/31/2022 1518   RDW 17.9 (H) 07/31/2022 1518   LYMPHSABS 0.9 07/31/2022 1518   MONOABS 0.4 07/31/2022 1518   EOSABS 0.2 07/31/2022 1518   BASOSABS 0.0 07/31/2022 1518    CMP      Component Value Date/Time   NA 140 07/31/2022 1518   K 3.9 07/31/2022 1518   CL 106 07/31/2022 1518   CO2 27 07/31/2022 1518   GLUCOSE 100 (H) 07/31/2022 1518   BUN 13 07/31/2022 1518   CREATININE 0.89 07/31/2022 1518   CALCIUM 8.6 (L) 07/31/2022 1518   PROT 6.9 07/31/2022 1518   ALBUMIN 3.6 07/31/2022 1518   AST 29 07/31/2022 1518   ALT 13 07/31/2022 1518   ALKPHOS 110 07/31/2022 1518   BILITOT 0.4 07/31/2022 1518   GFRNONAA >60 07/31/2022 1518   GFRAA >60 02/24/2020 1454   GFRAA >60 07/24/2019 1453    ASSESSMENT and THERAPY PLAN:   Malignant neoplasm metastatic to bone [facility]) Reina Cockman is a 62 year old woman with metastatic breast cancer currently on treatment with Elacestrant and Xgeva.    She tells me that she is doing moderately well today.  She has no clinical signs of metastatic breast cancer  progression.  She will continue on the LS estrogen as she is tolerating it well.  She is also due for Xgeva today.  She is taking calcium twice daily.  Her calcium level has chronically been in the 8 range.  She will proceed with Xgeva.  On Xgeva days she takes 4 tablets of calcium carbonate.  I recommended that she take 4 additional calcium carbonate tablets tomorrow as well.  She will also increase her calcium supplementation to 3 times a day.  I will continue to work on getting her in with somebody for her psoriasis.  I reached out to our Public affairs consultant, Dr. Sherrlyn Hock about Derm Dx for this patient a couple weeks ago.  This is a platform of partnership with dermatology.  I called him to follow-up on this today and he notes that the platform will likely be available in the next couple weeks and he stated he would let me know.  Her rheumatology appointment for her psoriasis is in June however I am hopeful that the Derm DX platform will allow [place] to start treatment faster.  [name] will return in 4 weeks for labs, follow-up with Dr. Chryl Heck, and her next injection.   All  questions were answered. The patient knows to call the clinic with any problems, questions or concerns. We can certainly see the patient much sooner if necessary.  Total encounter time:30 minutes*in face-to-face visit time, chart review, lab review, care coordination, order entry, and documentation of the encounter time.    Wilber Bihari, NP 08/01/22 8:47 AM Medical Oncology and Hematology [facility] [place], [place] [address] Tel. [phone]    Fax. [phone]  *Total Encounter Time as defined by the Centers for Medicare and Medicaid Services includes, in addition to the face-to-face time of a patient visit (documented in the note above) non-face-to-face time: obtaining and reviewing outside history, ordering and reviewing medications, tests or procedures, care coordination (communications with other health care professionals or caregivers) and documentation in the medical record.

## 2022-07-31 NOTE — Telephone Encounter (Signed)
Spoke with patient regarding upcoming appointment change

## 2022-08-01 ENCOUNTER — Encounter: Payer: Self-pay | Admitting: Hematology and Oncology

## 2022-08-01 DIAGNOSIS — K259 Gastric ulcer, unspecified as acute or chronic, without hemorrhage or perforation: Secondary | ICD-10-CM | POA: Diagnosis not present

## 2022-08-01 DIAGNOSIS — Z8719 Personal history of other diseases of the digestive system: Secondary | ICD-10-CM | POA: Diagnosis not present

## 2022-08-01 DIAGNOSIS — D649 Anemia, unspecified: Secondary | ICD-10-CM | POA: Diagnosis not present

## 2022-08-01 LAB — CANCER ANTIGEN 27.29: CA 27.29: 997.5 U/mL — ABNORMAL HIGH (ref 0.0–38.6)

## 2022-08-01 NOTE — Assessment & Plan Note (Signed)
Suzanne [name] is a 62 year old woman with metastatic breast cancer currently on treatment with Elacestrant and Xgeva.    She tells me that she is doing moderately well today.  She has no clinical signs of metastatic breast cancer progression.  She will continue on the LS estrogen as she is tolerating it well.  She is also due for Xgeva today.  She is taking calcium twice daily.  Her calcium level has chronically been in the 8 range.  She will proceed with Xgeva.  On Xgeva days she takes 4 tablets of calcium carbonate.  I recommended that she take 4 additional calcium carbonate tablets tomorrow as well.  She will also increase her calcium supplementation to 3 times a day.  I will continue to work on getting her in with somebody for her psoriasis.  I reached out to our Public affairs consultant, Dr. Sherrlyn Hock about Derm Dx for this patient a couple weeks ago.  This is a platform of partnership with dermatology.  I called him to follow-up on this today and he notes that the platform will likely be available in the next couple weeks and he stated he would let me know.  Her rheumatology appointment for her psoriasis is in June however I am hopeful that the Derm DX platform will allow [place] to start treatment faster.  Suzanne [name] will return in 4 weeks for labs, follow-up with Dr. Chryl Heck, and her next injection.

## 2022-08-03 DIAGNOSIS — C50811 Malignant neoplasm of overlapping sites of right female breast: Secondary | ICD-10-CM | POA: Diagnosis not present

## 2022-08-06 DIAGNOSIS — C50811 Malignant neoplasm of overlapping sites of right female breast: Secondary | ICD-10-CM | POA: Diagnosis not present

## 2022-08-07 DIAGNOSIS — C50811 Malignant neoplasm of overlapping sites of right female breast: Secondary | ICD-10-CM | POA: Diagnosis not present

## 2022-08-08 DIAGNOSIS — C50811 Malignant neoplasm of overlapping sites of right female breast: Secondary | ICD-10-CM | POA: Diagnosis not present

## 2022-08-09 DIAGNOSIS — C50811 Malignant neoplasm of overlapping sites of right female breast: Secondary | ICD-10-CM | POA: Diagnosis not present

## 2022-08-13 DIAGNOSIS — C50811 Malignant neoplasm of overlapping sites of right female breast: Secondary | ICD-10-CM | POA: Diagnosis not present

## 2022-08-14 ENCOUNTER — Encounter: Payer: Self-pay | Admitting: Nurse Practitioner

## 2022-08-14 ENCOUNTER — Ambulatory Visit: Payer: Medicaid Other | Attending: Nurse Practitioner | Admitting: Nurse Practitioner

## 2022-08-14 VITALS — BP 124/83 | HR 100 | Ht 59.0 in | Wt 173.0 lb

## 2022-08-14 DIAGNOSIS — Z0000 Encounter for general adult medical examination without abnormal findings: Secondary | ICD-10-CM | POA: Diagnosis not present

## 2022-08-14 DIAGNOSIS — Z23 Encounter for immunization: Secondary | ICD-10-CM | POA: Diagnosis not present

## 2022-08-14 DIAGNOSIS — R21 Rash and other nonspecific skin eruption: Secondary | ICD-10-CM

## 2022-08-14 MED ORDER — HYDROXYZINE HCL 10 MG PO TABS: 10.0000 mg | ORAL_TABLET | Freq: Three times a day (TID) | ORAL | 3 refills | Status: DC | PRN

## 2022-08-14 NOTE — Progress Notes (Signed)
Assessment & Plan:  Suzanne [name] was seen today for annual exam.  Diagnoses and all orders for this visit:  Encounter for annual physical exam  Need for Tdap vaccination -     Tdap vaccine greater than or equal to 64yo IM  Skin rash -     Ambulatory referral to Dermatology -     hydrOXYzine (ATARAX) 10 MG tablet; Take 1-2 tablets (10-20 mg total) by mouth 3 (three) times daily as needed.  Need for shingles vaccine -     Varicella-zoster vaccine IM    Patient has been counseled on age-appropriate routine health concerns for screening and prevention. These are reviewed and up-to-date. Referrals have been placed accordingly. Immunizations are up-to-date or declined.    Subjective:   Chief Complaint  Patient presents with   Annual Exam   HPI Suzanne [name] 62 y.o. female presents to office today for annual physical exam  She has a diffuse skin rash that was recently biopsied as psoriasis. She has been waiting to see a dermatologist and was referred to [facility] by Oncology but does not have transportation to another city.   She is accompanied by her daughter today.   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      Review of Systems  Constitutional:  Negative for fever, malaise/fatigue and weight loss.  HENT: Negative.  Negative for nosebleeds.   Eyes: Negative.  Negative for blurred vision, double vision and photophobia.  Respiratory:  Positive for shortness of breath (chronic o2). Negative for cough.   Cardiovascular: Negative.  Negative for chest pain, palpitations and leg swelling.  Gastrointestinal: Negative.  Negative for heartburn, nausea and vomiting.  Genitourinary: Negative.   Musculoskeletal: Negative.  Negative for myalgias.  Skin:  Positive for itching and rash.  Neurological:  Positive for weakness (uses wc to assist with mobility). Negative for  dizziness, focal weakness, seizures and headaches.  Endo/Heme/Allergies: Negative.   Psychiatric/Behavioral: Negative.  Negative for suicidal ideas.     Past Medical History:  Diagnosis Date   Cancer [facility])    Family history of breast cancer    Family history of colon cancer    Family history of melanoma    Family history of prostate cancer    Gallstones    Glaucoma    Mastitis    reports history of recurrent mastitis   Metastatic breast cancer    Obesity     Past Surgical History:  Procedure Laterality Date   ESOPHAGOGASTRODUODENOSCOPY (EGD) WITH PROPOFOL N/A 05/27/2022   Procedure: ESOPHAGOGASTRODUODENOSCOPY (EGD) WITH PROPOFOL;  Surgeon: Arta Silence, MD;  Location: [facility];  Service: Gastroenterology;  Laterality: N/A;   GLAUCOMA SURGERY  late 1990's   IR THORACENTESIS ASP PLEURAL SPACE W/IMG GUIDE  10/28/2018   IR THORACENTESIS ASP PLEURAL SPACE W/IMG GUIDE  10/29/2018   OPEN REDUCTION INTERNAL FIXATION (ORIF) DISTAL RADIAL FRACTURE Left 01/09/2019   Procedure: OPEN REDUCTION INTERNAL FIXATION (ORIF) HUMERAL FRACTURE;  Surgeon: Netta Cedars, MD;  Location: [facility];  Service: Orthopedics;  Laterality: Left;    Family History  Problem Relation Age of Onset   Breast cancer Sister        in her 81's-70s   Heart disease Brother        CABG   Heart disease Brother        CABG   Skin cancer Brother  melanoma   Colon cancer Cousin    Heart attack Mother    Heart attack Father    Prostate cancer Brother     Social History Reviewed with no changes to be made today.   Outpatient Medications Prior to Visit  Medication Sig Dispense Refill   acetaminophen (TYLENOL) 500 MG tablet Take 1,000 mg by mouth daily as needed for mild pain.     cetirizine (ZYRTEC) 10 MG tablet Take 10 mg by mouth daily.     Denosumab (XGEVA [place]) Inject 1 Dose into the skin every 30 (thirty) days.     elacestrant hydrochloride (ORSERDU) 345 MG tablet Take 1 tablet (345 mg total) by mouth  daily. Take with food. 30 tablet 3   folic acid (FOLVITE) 1 MG tablet Take 1 tablet (1 mg total) by mouth daily. 90 tablet 4   furosemide (LASIX) 20 MG tablet Take 1 tablet (20 mg total) by mouth daily. 90 tablet 4   ipratropium-albuterol (DUONEB) 0.5-2.5 (3) MG/3ML SOLN Take 3 mLs by nebulization every 6 (six) hours as needed. 360 mL 0   loperamide (IMODIUM) 2 MG capsule Take 2 mg by mouth daily as needed for diarrhea or loose stools.     MELATONIN TR 1 MG TBCR TAKE ONE TABLET BY MOUTH EVERYDAY AT BEDTIME (Patient taking differently: Take 1 mg by mouth at bedtime.) 90 tablet 4   OYSTER SHELL CALCIUM PLUS D 500-5 MG-MCG TABS TAKE ONE TABLET BY MOUTH EVERY MORNING and TAKE ONE TABLET BY MOUTH AT NOON and TAKE ONE TABLET BY MOUTH EVERY EVENING (Patient taking differently: Take 1 tablet by mouth 2 (two) times daily.) 90 tablet 1   pantoprazole (PROTONIX) 40 MG tablet Take 1 tablet (40 mg total) by mouth 2 (two) times daily. 60 tablet 2   polyethylene glycol (MIRALAX / GLYCOLAX) 17 g packet Take 17 g by mouth as needed. (Patient taking differently: Take 17 g by mouth daily as needed for mild constipation.) 14 each 6   famotidine (PEPCID) 20 MG tablet Take 1 tablet (20 mg total) by mouth 2 (two) times daily as needed for heartburn or indigestion. (Patient not taking: Reported on 08/14/2022) 30 tablet 3   ondansetron (ZOFRAN) 8 MG tablet Take 1 tablet (8 mg total) by mouth every 8 (eight) hours as needed for nausea or vomiting. (Patient not taking: Reported on 08/14/2022) 20 tablet 1   sertraline (ZOLOFT) 50 MG tablet TAKE 1 AND 1/2 TABLETS BY MOUTH EVERYDAY AT BEDTIME (Patient not taking: Reported on 08/14/2022) 45 tablet 0   traMADol (ULTRAM) 50 MG tablet Take 1 tablet (50 mg total) by mouth every 6 (six) hours as needed. (Patient not taking: Reported on 08/14/2022) 30 tablet 0   triamcinolone ointment (KENALOG) 0.5 % Apply 1 Application topically 2 (two) times daily. (Patient not taking: Reported on 08/14/2022)  30 g 0   No facility-administered medications prior to visit.    Allergies  Allergen Reactions   Ferumoxytol Other (See Comments)    Pt had hypersensitivity to Ferumoxytol. She had Facial Flushing. See note from 07/14/2021       Objective:    BP 124/83   Pulse 100   Ht 4\' 11"  (1.499 m)   Wt 173 lb (78.5 kg)   LMP 05/28/2013 (Within Months)   SpO2 99%   BMI 34.94 kg/m  Wt Readings from Last 3 Encounters:  08/14/22 173 lb (78.5 kg)  07/31/22 173 lb 9.6 oz (78.7 kg)  07/19/22 178 lb 9.6 oz (81  kg)    Physical Exam Constitutional:      Appearance: She is well-developed.  HENT:     Head: Normocephalic and atraumatic.     Right Ear: Hearing, tympanic membrane, ear canal and external ear normal.     Left Ear: Hearing, tympanic membrane, ear canal and external ear normal.     Nose: Nose normal.     Right Turbinates: Not enlarged.     Left Turbinates: Not enlarged.     Mouth/Throat:     Lips: Pink.     Mouth: Mucous membranes are moist.     Dentition: No dental tenderness, gingival swelling, dental abscesses or gum lesions.     Pharynx: No oropharyngeal exudate.  Eyes:     General: No scleral icterus.       Right eye: No discharge.     Extraocular Movements: Extraocular movements intact.     Conjunctiva/sclera: Conjunctivae normal.     Pupils: Pupils are equal, round, and reactive to light.  Neck:     Thyroid: No thyromegaly.     Trachea: No tracheal deviation.  Cardiovascular:     Rate and Rhythm: Normal rate and regular rhythm.     Heart sounds: Normal heart sounds. No murmur heard.    No friction rub.  Pulmonary:     Effort: Pulmonary effort is normal. No accessory muscle usage or respiratory distress.     Breath sounds: Normal breath sounds. No decreased breath sounds, wheezing, rhonchi or rales.  Abdominal:     General: Bowel sounds are normal. There is no distension.     Palpations: Abdomen is soft. There is no mass.     Tenderness: There is no abdominal  tenderness. There is no right CVA tenderness, left CVA tenderness, guarding or rebound.     Hernia: No hernia is present.  Musculoskeletal:        General: No tenderness or deformity. Normal range of motion.     Cervical back: Normal range of motion and neck supple.  Lymphadenopathy:     Cervical: No cervical adenopathy.  Skin:    General: Skin is warm and dry.     Findings: Rash (diffuse/generalized macular erythematous) present. No erythema.  Neurological:     Mental Status: She is alert and oriented to person, place, and time.     Cranial Nerves: No cranial nerve deficit.     Motor: Motor function is intact.     Coordination: Coordination is intact. Coordination normal.     Gait: Gait is intact.     Deep Tendon Reflexes:     Reflex Scores:      Patellar reflexes are 1+ on the right side and 1+ on the left side. Psychiatric:        Attention and Perception: Attention normal.        Mood and Affect: Mood normal.        Speech: Speech normal.        Behavior: Behavior normal.        Thought Content: Thought content normal.        Judgment: Judgment normal.         Patient has been counseled extensively about nutrition and exercise as well as the importance of adherence with medications and regular follow-up. The patient was given clear instructions to go to ER or return to medical center if symptoms don't improve, worsen or new problems develop. The patient verbalized understanding.   Follow-up: Return in about 3 months (around 11/14/2022) for  F/U SHINGLES.   [name], FNP-BC [facility] [place] and [place], [place]   08/14/2022, 8:40 PM

## 2022-08-14 NOTE — Progress Notes (Signed)
Daughter - [name] in room with patient.

## 2022-08-15 DIAGNOSIS — C50811 Malignant neoplasm of overlapping sites of right female breast: Secondary | ICD-10-CM | POA: Diagnosis not present

## 2022-08-16 ENCOUNTER — Encounter: Payer: Self-pay | Admitting: Adult Health

## 2022-08-16 ENCOUNTER — Other Ambulatory Visit (HOSPITAL_COMMUNITY): Payer: Self-pay

## 2022-08-16 DIAGNOSIS — C50811 Malignant neoplasm of overlapping sites of right female breast: Secondary | ICD-10-CM | POA: Diagnosis not present

## 2022-08-17 DIAGNOSIS — C50811 Malignant neoplasm of overlapping sites of right female breast: Secondary | ICD-10-CM | POA: Diagnosis not present

## 2022-08-20 DIAGNOSIS — C50811 Malignant neoplasm of overlapping sites of right female breast: Secondary | ICD-10-CM | POA: Diagnosis not present

## 2022-08-21 DIAGNOSIS — C50811 Malignant neoplasm of overlapping sites of right female breast: Secondary | ICD-10-CM | POA: Diagnosis not present

## 2022-08-22 DIAGNOSIS — C50811 Malignant neoplasm of overlapping sites of right female breast: Secondary | ICD-10-CM | POA: Diagnosis not present

## 2022-08-23 DIAGNOSIS — C50811 Malignant neoplasm of overlapping sites of right female breast: Secondary | ICD-10-CM | POA: Diagnosis not present

## 2022-08-24 DIAGNOSIS — C50811 Malignant neoplasm of overlapping sites of right female breast: Secondary | ICD-10-CM | POA: Diagnosis not present

## 2022-08-27 ENCOUNTER — Emergency Department (HOSPITAL_COMMUNITY)
Admission: EM | Admit: 2022-08-27 | Discharge: 2022-08-27 | Disposition: A | Discharge status: Home / Self Care | Payer: Medicaid Other | Attending: Emergency Medicine | Admitting: Emergency Medicine

## 2022-08-27 DIAGNOSIS — R6510 Systemic inflammatory response syndrome (SIRS) of non-infectious origin without acute organ dysfunction: Secondary | ICD-10-CM | POA: Diagnosis not present

## 2022-08-27 DIAGNOSIS — J449 Chronic obstructive pulmonary disease, unspecified: Secondary | ICD-10-CM | POA: Diagnosis not present

## 2022-08-27 DIAGNOSIS — R1012 Left upper quadrant pain: Secondary | ICD-10-CM | POA: Diagnosis not present

## 2022-08-27 DIAGNOSIS — N19 Unspecified kidney failure: Secondary | ICD-10-CM | POA: Diagnosis not present

## 2022-08-27 DIAGNOSIS — Z853 Personal history of malignant neoplasm of breast: Secondary | ICD-10-CM | POA: Insufficient documentation

## 2022-08-27 DIAGNOSIS — J40 Bronchitis, not specified as acute or chronic: Secondary | ICD-10-CM | POA: Diagnosis not present

## 2022-08-27 DIAGNOSIS — C7801 Secondary malignant neoplasm of right lung: Secondary | ICD-10-CM | POA: Diagnosis not present

## 2022-08-27 DIAGNOSIS — C50811 Malignant neoplasm of overlapping sites of right female breast: Secondary | ICD-10-CM | POA: Diagnosis not present

## 2022-08-27 DIAGNOSIS — R0602 Shortness of breath: Secondary | ICD-10-CM | POA: Diagnosis not present

## 2022-08-27 DIAGNOSIS — R197 Diarrhea, unspecified: Secondary | ICD-10-CM | POA: Diagnosis not present

## 2022-08-27 DIAGNOSIS — S22000A Wedge compression fracture of unspecified thoracic vertebra, initial encounter for closed fracture: Secondary | ICD-10-CM | POA: Diagnosis not present

## 2022-08-27 DIAGNOSIS — J9621 Acute and chronic respiratory failure with hypoxia: Secondary | ICD-10-CM | POA: Diagnosis not present

## 2022-08-27 DIAGNOSIS — R1084 Generalized abdominal pain: Secondary | ICD-10-CM | POA: Diagnosis not present

## 2022-08-27 DIAGNOSIS — J309 Allergic rhinitis, unspecified: Secondary | ICD-10-CM | POA: Diagnosis not present

## 2022-08-27 LAB — CBC WITH DIFFERENTIAL/PLATELET
Abs Immature Granulocytes: 0.02 10*3/uL (ref 0.00–0.07)
Basophils Absolute: 0 10*3/uL (ref 0.0–0.1)
Basophils Relative: 0 %
Eosinophils Absolute: 0.1 10*3/uL (ref 0.0–0.5)
Eosinophils Relative: 2 %
HCT: 31.3 % — ABNORMAL LOW (ref 36.0–46.0)
Hemoglobin: 9.1 g/dL — ABNORMAL LOW (ref 12.0–15.0)
Immature Granulocytes: 0 %
Lymphocytes Relative: 10 %
Lymphs Abs: 0.8 10*3/uL (ref 0.7–4.0)
MCH: 25.4 pg — ABNORMAL LOW (ref 26.0–34.0)
MCHC: 29.1 g/dL — ABNORMAL LOW (ref 30.0–36.0)
MCV: 87.4 fL (ref 80.0–100.0)
Monocytes Absolute: 0.4 10*3/uL (ref 0.1–1.0)
Monocytes Relative: 6 %
Neutro Abs: 5.9 10*3/uL (ref 1.7–7.7)
Neutrophils Relative %: 82 %
Platelets: 266 10*3/uL (ref 150–400)
RBC: 3.58 MIL/uL — ABNORMAL LOW (ref 3.87–5.11)
RDW: 17.2 % — ABNORMAL HIGH (ref 11.5–15.5)
WBC: 7.2 10*3/uL (ref 4.0–10.5)
nRBC: 0 % (ref 0.0–0.2)

## 2022-08-27 LAB — COMPREHENSIVE METABOLIC PANEL
ALT: 18 U/L (ref 0–44)
AST: 36 U/L (ref 15–41)
Albumin: 3.3 g/dL — ABNORMAL LOW (ref 3.5–5.0)
Alkaline Phosphatase: 94 U/L (ref 38–126)
Anion gap: 9 (ref 5–15)
BUN: 8 mg/dL (ref 8–23)
CO2: 26 mmol/L (ref 22–32)
Calcium: 8.3 mg/dL — ABNORMAL LOW (ref 8.9–10.3)
Chloride: 106 mmol/L (ref 98–111)
Creatinine, Ser: 0.8 mg/dL (ref 0.44–1.00)
GFR, Estimated: >60 mL/min (ref >60)
Glucose, Bld: 130 mg/dL — ABNORMAL HIGH (ref 70–99)
Potassium: 3.5 mmol/L (ref 3.5–5.1)
Sodium: 141 mmol/L (ref 135–145)
Total Bilirubin: 0.4 mg/dL (ref 0.3–1.2)
Total Protein: 7 g/dL (ref 6.5–8.1)

## 2022-08-27 LAB — LIPASE, BLOOD: Lipase: 29 U/L (ref 11–51)

## 2022-08-27 MED ORDER — LIDOCAINE VISCOUS HCL 2 % MT SOLN
15.0000 mL | Freq: Once | OROMUCOSAL | Status: AC
  Administered 2022-08-27: 15 mL via OROMUCOSAL
  Filled 2022-08-27: qty 15

## 2022-08-27 MED ORDER — ALUM & MAG HYDROXIDE-SIMETH 200-200-20 MG/5ML PO SUSP
30.0000 mL | Freq: Once | ORAL | Status: AC
  Administered 2022-08-27: 30 mL via ORAL
  Filled 2022-08-27: qty 30

## 2022-08-27 NOTE — ED Notes (Signed)
[facility] called for transport back home at this time.

## 2022-08-27 NOTE — ED Provider Notes (Signed)
[place] EMERGENCY DEPARTMENT AT [facility] Provider Note   CSN: [id number] Arrival date & time: 08/27/22  0124     History  Chief Complaint  Patient presents with   Abdominal Pain    Suzanne [name] is a 62 y.o. female.  The history is provided by the patient and medical records.  Abdominal Pain  62 y.o. F with hx of metastatic breast cancer, COPD, anemia, hiatal hernia, presenting to the ED with left upper abdominal pain.  States she feels like it almost feels like heartburn.  She does admit to eating some spicy foods recently (pizza, etc)  which may have caused it.  She denies nausea/vomiting.  She had diarrhea 2 days ago, no BM since that time.  She denies fever. No urinary symptoms.  She did take tramadol PTA and pain is starting to resolve.    Home Medications Prior to Admission medications   Medication Sig Start Date End Date Taking? Authorizing Provider  acetaminophen (TYLENOL) 500 MG tablet Take 1,000 mg by mouth daily as needed for mild pain.    [provider]  cetirizine (ZYRTEC) 10 MG tablet Take 10 mg by mouth daily.    [provider]  Denosumab (XGEVA [place]) Inject 1 Dose into the skin every 30 (thirty) days.    [provider]  elacestrant hydrochloride (ORSERDU) 345 MG tablet Take 1 tablet (345 mg total) by mouth daily. Take with food. 04/25/22   Benay Pike, MD  famotidine (PEPCID) 20 MG tablet Take 1 tablet (20 mg total) by mouth 2 (two) times daily as needed for heartburn or indigestion. Patient not taking: Reported on 08/14/2022 07/28/20   [name], Virgie Dad, MD  folic acid (FOLVITE) 1 MG tablet Take 1 tablet (1 mg total) by mouth daily. 09/29/21   Benay Pike, MD  furosemide (LASIX) 20 MG tablet Take 1 tablet (20 mg total) by mouth daily. 09/29/21   Benay Pike, MD  hydrOXYzine (ATARAX) 10 MG tablet Take 1-2 tablets (10-20 mg total) by mouth 3 (three) times daily as needed. 08/14/22   [name], NP   ipratropium-albuterol (DUONEB) 0.5-2.5 (3) MG/3ML SOLN Take 3 mLs by nebulization every 6 (six) hours as needed. 05/03/22   Benay Pike, MD  loperamide (IMODIUM) 2 MG capsule Take 2 mg by mouth daily as needed for diarrhea or loose stools.    [provider]  MELATONIN TR 1 MG TBCR TAKE ONE TABLET BY MOUTH EVERYDAY AT BEDTIME Patient taking differently: Take 1 mg by mouth at bedtime. 04/03/22   Benay Pike, MD  ondansetron (ZOFRAN) 8 MG tablet Take 1 tablet (8 mg total) by mouth every 8 (eight) hours as needed for nausea or vomiting. Patient not taking: Reported on 08/14/2022 02/26/22   Benay Pike, MD  OYSTER SHELL CALCIUM PLUS D 500-5 MG-MCG TABS TAKE ONE TABLET BY MOUTH EVERY MORNING and TAKE ONE TABLET BY MOUTH AT NOON and TAKE ONE TABLET BY MOUTH EVERY EVENING Patient taking differently: Take 1 tablet by mouth 2 (two) times daily. 04/27/21   [name], Virgie Dad, MD  pantoprazole (PROTONIX) 40 MG tablet Take 1 tablet (40 mg total) by mouth 2 (two) times daily. 05/30/22 08/28/22  Hosie Poisson, MD      Allergies    Ferumoxytol    Review of Systems   Review of Systems  Gastrointestinal:  Positive for abdominal pain.  All other systems reviewed and are negative.   Physical Exam Updated Vital Signs BP (!) 130/90 (BP Location:  Right Arm)   Pulse 92   Temp 98.4 F (36.9 C) (Oral)   Resp 17   Ht 4\' 11"  (1.499 m)   Wt 79.4 kg   LMP 05/28/2013 (Within Months)   SpO2 100%   BMI 35.35 kg/m   Physical Exam Vitals and nursing note reviewed.  Constitutional:      Appearance: She is well-developed.  HENT:     Head: Normocephalic and atraumatic.  Eyes:     Conjunctiva/sclera: Conjunctivae normal.     Pupils: Pupils are equal, round, and reactive to light.  Cardiovascular:     Rate and Rhythm: Normal rate and regular rhythm.     Heart sounds: Normal heart sounds.  Pulmonary:     Effort: Pulmonary effort is normal.     Breath sounds: Normal breath sounds.  Abdominal:      General: Bowel sounds are normal.     Palpations: Abdomen is soft.     Tenderness: There is no abdominal tenderness. There is no guarding or rebound.     Comments: Chronic rash to abdominal wall, unchanged per patient No elicited tenderness on exam, normal bowel sounds  Musculoskeletal:        General: Normal range of motion.     Cervical back: Normal range of motion.  Skin:    General: Skin is warm and dry.  Neurological:     Mental Status: She is alert and oriented to person, place, and time.     ED Results / Procedures / Treatments   Labs (all labs ordered are listed, but only abnormal results are displayed) Labs Reviewed - No data to display  EKG None  Radiology No results found.  Procedures Procedures    Medications Ordered in ED Medications  alum & mag hydroxide-simeth (MAALOX/MYLANTA) 200-200-20 MG/5ML suspension 30 mL (has no administration in time range)  lidocaine (XYLOCAINE) 2 % viscous mouth solution 15 mL (has no administration in time range)    ED Course/ Medical Decision Making/ A&P                             Medical Decision Making Amount and/or Complexity of Data Reviewed Labs: ordered. ECG/medicine tests: ordered and independent interpretation performed.  Risk OTC drugs. Prescription drug management.   62 y.o. F here with LUQ abdominal pain.  Has hx of hiatal hernia, has been eating spicy foods and does feel some reflux type symptoms.  No nausea/vomiting, diarrhea 2 days ago.  She is afebrile, non-toxic in appearance.  Her abdomen soft, non-tender.  She does have chronic rash to the abdomen, unchanged from baseline per patient's report (recent bx showed it was psoriasis--seeing derm next week).  Will check labs, give  GI cocktail.  2:28 AM Labs reviewed-- no leukocytosis or electrolyte derangement.  Normal lipase, normal LFT's.  After GI cocktail, symptoms have fully resolved.  Suspect likely GERD.  She has protonix at home that she will  continue, can use maalox, TUMS or similar for more immediate relief if needed.  Advised to limit spicy/acidic foods.  Can follow-up with PCP.  Return here for new concerns.  Final Clinical Impression(s) / ED Diagnoses Final diagnoses:  LUQ abdominal pain    Rx / DC Orders ED Discharge Orders     None         Larene Pickett, PA-C 08/27/22 0302    Molpus, Jenny Reichmann, MD 08/27/22 [phone]

## 2022-08-27 NOTE — ED Triage Notes (Signed)
Pt from home BIB [facility] for abd pain since Saturday night. Pt denies n/v, pt reports diarrhea on Friday, has not ben able to use the restroom since. Pt took tramadol at 10 pm, which is starting to ease her pain. VSS, A&Ox4. Pt wears supplemental 02 regularly. Hx of gallstones and stage 4 metastatic breast cancer. Takes oral chemo.

## 2022-08-27 NOTE — Discharge Instructions (Signed)
Continue your protonix.  Can take maalox, tums, or similar if needed for more immediate relief of symptoms. I would try to avoid spicy/acidic foods as this can worsen symptoms. Follow-up with your primary care doctor. Return here for new concerns.

## 2022-08-28 ENCOUNTER — Telehealth: Payer: Self-pay | Admitting: Obstetrics and Gynecology

## 2022-08-28 ENCOUNTER — Other Ambulatory Visit: Payer: Self-pay

## 2022-08-28 ENCOUNTER — Inpatient Hospital Stay: Payer: Medicaid Other

## 2022-08-28 ENCOUNTER — Inpatient Hospital Stay (HOSPITAL_BASED_OUTPATIENT_CLINIC_OR_DEPARTMENT_OTHER): Payer: Medicaid Other | Admitting: Hematology and Oncology

## 2022-08-28 ENCOUNTER — Inpatient Hospital Stay: Payer: Medicaid Other | Attending: Adult Health

## 2022-08-28 ENCOUNTER — Other Ambulatory Visit: Payer: Self-pay | Admitting: *Deleted

## 2022-08-28 VITALS — BP 133/69 | HR 108 | Temp 97.8°F | Resp 16 | Ht 59.0 in | Wt 174.6 lb

## 2022-08-28 DIAGNOSIS — Z170 Estrogen receptor positive status [ER+]: Secondary | ICD-10-CM

## 2022-08-28 DIAGNOSIS — C50811 Malignant neoplasm of overlapping sites of right female breast: Secondary | ICD-10-CM | POA: Diagnosis present

## 2022-08-28 DIAGNOSIS — Z00129 Encounter for routine child health examination without abnormal findings: Secondary | ICD-10-CM

## 2022-08-28 DIAGNOSIS — C7951 Secondary malignant neoplasm of bone: Secondary | ICD-10-CM | POA: Diagnosis not present

## 2022-08-28 DIAGNOSIS — J90 Pleural effusion, not elsewhere classified: Secondary | ICD-10-CM

## 2022-08-28 DIAGNOSIS — D518 Other vitamin B12 deficiency anemias: Secondary | ICD-10-CM

## 2022-08-28 LAB — CMP (CANCER CENTER ONLY)
ALT: 18 U/L (ref 0–44)
AST: 38 U/L (ref 15–41)
Albumin: 3.6 g/dL (ref 3.5–5.0)
Alkaline Phosphatase: 111 U/L (ref 38–126)
Anion gap: 7 (ref 5–15)
BUN: 12 mg/dL (ref 8–23)
CO2: 29 mmol/L (ref 22–32)
Calcium: 8.7 mg/dL — ABNORMAL LOW (ref 8.9–10.3)
Chloride: 106 mmol/L (ref 98–111)
Creatinine: 0.86 mg/dL (ref 0.44–1.00)
GFR, Estimated: >60 mL/min (ref >60)
Glucose, Bld: 84 mg/dL (ref 70–99)
Potassium: 3.8 mmol/L (ref 3.5–5.1)
Sodium: 142 mmol/L (ref 135–145)
Total Bilirubin: 0.5 mg/dL (ref 0.3–1.2)
Total Protein: 7 g/dL (ref 6.5–8.1)

## 2022-08-28 LAB — CBC WITH DIFFERENTIAL (CANCER CENTER ONLY)
Abs Immature Granulocytes: 0.01 10*3/uL (ref 0.00–0.07)
Basophils Absolute: 0 10*3/uL (ref 0.0–0.1)
Basophils Relative: 1 %
Eosinophils Absolute: 0.2 10*3/uL (ref 0.0–0.5)
Eosinophils Relative: 3 %
HCT: 33.2 % — ABNORMAL LOW (ref 36.0–46.0)
Hemoglobin: 9.7 g/dL — ABNORMAL LOW (ref 12.0–15.0)
Immature Granulocytes: 0 %
Lymphocytes Relative: 18 %
Lymphs Abs: 0.9 10*3/uL (ref 0.7–4.0)
MCH: 25.5 pg — ABNORMAL LOW (ref 26.0–34.0)
MCHC: 29.2 g/dL — ABNORMAL LOW (ref 30.0–36.0)
MCV: 87.1 fL (ref 80.0–100.0)
Monocytes Absolute: 0.3 10*3/uL (ref 0.1–1.0)
Monocytes Relative: 6 %
Neutro Abs: 3.7 10*3/uL (ref 1.7–7.7)
Neutrophils Relative %: 72 %
Platelet Count: 296 10*3/uL (ref 150–400)
RBC: 3.81 MIL/uL — ABNORMAL LOW (ref 3.87–5.11)
RDW: 17.2 % — ABNORMAL HIGH (ref 11.5–15.5)
WBC Count: 5.2 10*3/uL (ref 4.0–10.5)
nRBC: 0 % (ref 0.0–0.2)

## 2022-08-28 LAB — LIPID PANEL
Cholesterol: 215 mg/dL — ABNORMAL HIGH (ref 0–200)
HDL: 52 mg/dL (ref >40)
LDL Cholesterol: 143 mg/dL — ABNORMAL HIGH (ref 0–99)
Total CHOL/HDL Ratio: 4.1 RATIO
Triglycerides: 102 mg/dL (ref <150)
VLDL: 20 mg/dL (ref 0–40)

## 2022-08-28 LAB — FERRITIN: Ferritin: 15 ng/mL (ref 11–307)

## 2022-08-28 LAB — VITAMIN B12: Vitamin B-12: 600 pg/mL (ref 180–914)

## 2022-08-28 MED ORDER — DENOSUMAB 120 MG/1.7ML ~~LOC~~ SOLN
120.0000 mg | Freq: Once | SUBCUTANEOUS | Status: AC
  Administered 2022-08-28: 120 mg via SUBCUTANEOUS
  Filled 2022-08-28: qty 1.7

## 2022-08-28 NOTE — Patient Instructions (Signed)
Denosumab Injection (Oncology) What is this medication? DENOSUMAB (den oh SUE mab) prevents weakened bones caused by cancer. It may also be used to treat noncancerous bone tumors that cannot be removed by surgery. It can also be used to treat high calcium levels in the blood caused by cancer. It works by blocking a protein that causes bones to break down quickly. This slows down the release of calcium from bones, which lowers calcium levels in your blood. It also makes your bones stronger and less likely to break (fracture). This medicine may be used for other purposes; ask your health care provider or pharmacist if you have questions. COMMON BRAND NAME(S): XGEVA What should I tell my care team before I take this medication? They need to know if you have any of these conditions: Dental disease Having surgery or tooth extraction Infection Kidney disease Low levels of calcium or vitamin D in the blood Malnutrition On hemodialysis Skin conditions or sensitivity Thyroid or parathyroid disease An unusual reaction to denosumab, other medications, foods, dyes, or preservatives Pregnant or trying to get pregnant Breast-feeding How should I use this medication? This medication is for injection under the skin. It is given by your care team in a hospital or clinic setting. A special MedGuide will be given to you before each treatment. Be sure to read this information carefully each time. Talk to your care team about the use of this medication in children. While it may be prescribed for children as young as 13 years for selected conditions, precautions do apply. Overdosage: If you think you have taken too much of this medicine contact a poison control center or emergency room at once. NOTE: This medicine is only for you. Do not share this medicine with others. What if I miss a dose? Keep appointments for follow-up doses. It is important not to miss your dose. Call your care team if you are unable to  keep an appointment. What may interact with this medication? Do not take this medication with any of the following: Other medications containing denosumab This medication may also interact with the following: Medications that lower your chance of fighting infection Steroid medications, such as prednisone or cortisone This list may not describe all possible interactions. Give your health care provider a list of all the medicines, herbs, non-prescription drugs, or dietary supplements you use. Also tell them if you smoke, drink alcohol, or use illegal drugs. Some items may interact with your medicine. What should I watch for while using this medication? Your condition will be monitored carefully while you are receiving this medication. You may need blood work while taking this medication. This medication may increase your risk of getting an infection. Call your care team for advice if you get a fever, chills, sore throat, or other symptoms of a cold or flu. Do not treat yourself. Try to avoid being around people who are sick. You should make sure you get enough calcium and vitamin D while you are taking this medication, unless your care team tells you not to. Discuss the foods you eat and the vitamins you take with your care team. Some people who take this medication have severe bone, joint, or muscle pain. This medication may also increase your risk for jaw problems or a broken thigh bone. Tell your care team right away if you have severe pain in your jaw, bones, joints, or muscles. Tell your care team if you have any pain that does not go away or that gets worse. Talk   to your care team if you may be pregnant. Serious birth defects can occur if you take this medication during pregnancy and for 5 months after the last dose. You will need a negative pregnancy test before starting this medication. Contraception is recommended while taking this medication and for 5 months after the last dose. Your care team  can help you find the option that works for you. What side effects may I notice from receiving this medication? Side effects that you should report to your care team as soon as possible: Allergic reactions--skin rash, itching, hives, swelling of the face, lips, tongue, or throat Bone, joint, or muscle pain Low calcium level--muscle pain or cramps, confusion, tingling, or numbness in the hands or feet Osteonecrosis of the jaw--pain, swelling, or redness in the mouth, numbness of the jaw, poor healing after dental work, unusual discharge from the mouth, visible bones in the mouth Side effects that usually do not require medical attention (report to your care team if they continue or are bothersome): Cough Diarrhea Fatigue Headache Nausea This list may not describe all possible side effects. Call your doctor for medical advice about side effects. You may report side effects to FDA at 1-800-FDA-1088. Where should I keep my medication? This medication is given in a hospital or clinic. It will not be stored at home. NOTE: This sheet is a summary. It may not cover all possible information. If you have questions about this medicine, talk to your doctor, pharmacist, or health care provider.  2023 Elsevier/Gold Standard (2021-10-02 00:00:00)  

## 2022-08-28 NOTE — Transitions of Care (Post Inpatient/ED Visit) (Signed)
   08/28/2022  Name: Suzanne [name] MRN: [id number] DOB: 1961-01-01  Today's TOC FU Call Status: Today's TOC FU Call Status:: Successful TOC FU Call Competed TOC FU Call Complete Date: 08/28/22  Transition Care Management Follow-up Telephone Call Date of Discharge: 08/27/22 Discharge Facility: [facility]) Type of Discharge: Emergency Department Reason for ED Visit: Other: (upper abdominal pain) How have you been since you were released from the hospital?: Better Any questions or concerns?: No  Items Reviewed: Did you receive and understand the discharge instructions provided?: Yes Medications obtained and verified?: Yes (Medications Reviewed) Any new allergies since your discharge?: No Dietary orders reviewed?: Yes Type of Diet Ordered:: avoid spicy foods, stick to bland  diet Do you have support at home?: Yes People in Home: child(ren), adult Name of Support/Comfort Primary Source: [facility] and Equipment/Supplies: [place] Ordered?: NA Any new equipment or medical supplies ordered?: NA  Functional Questionnaire: Do you need assistance with bathing/showering or dressing?: No Do you need assistance with meal preparation?: No Do you need assistance with eating?: No Do you have difficulty maintaining continence: No Do you need assistance with getting out of bed/getting out of a chair/moving?: No Do you have difficulty managing or taking your medications?: No  Follow up appointments reviewed: PCP Follow-up appointment confirmed?: [facility] Follow-up appointment confirmed?: Yes Date of Specialist follow-up appointment?: 08/28/22 Follow-Up Specialty Provider:: Dr. Chryl Heck Do you need transportation to your follow-up appointment?: No Do you understand care options if your condition(s) worsen?: Yes-patient verbalized understanding  SDOH Interventions Today    Flowsheet Row Most Recent Value  SDOH Interventions   Alcohol Usage  Interventions Intervention Not Indicated (Score <7)      Aida Raider RN, BSN [place] Management Coordinator - Managed [place] [facility] Risk [phone].

## 2022-08-28 NOTE — Progress Notes (Signed)
[facility] Follow up:   [name], NP [address] [place] [address]  DIAGNOSIS: Metastatic breast cancer  SUMMARY OF ONCOLOGIC HISTORY:  [place], [place] woman presenting 10/26/2018 with right-sided inflammatory breast cancer, stage IV, involving lungs, lymph nodes and bones, as follows:             (a) chest CT scan 10/26/2018 shows bilateral pleural effusions, possible lymphangitic spread of tumor, diffuse bony metastatic disease, and significant axillary mediastinal and hilar adenopathy             (b) bone scan 10/27/2018 is a "near SuperScan" consistent with widespread bony metastatic disease             (c) head CT with and without contrast 10/30/2018 shows no intracranial metastatic disease, multiple calvarial lesions             (d) CA-27-29 on 10/27/2018 was 1810.4   (1) pleural fluid from right thoracentesis 10/28/2018 confirms malignant cells consistent with a breast primary, strongly estrogen and progesterone receptor positive, HER-2 not amplified, with an MIB-1 of 2%   (2) anastrozole started 10/29/2018             (a) palbociclib started 11/13/2018 at 100 mg dose             (b) denosumab/xgeva started 11/13/2018              (C) palbociclib dose decreased to 19m             (D) Palbociclib dose decreased to 75 mg every other day in 11/2021   (3) associated problems:             (a) hypoxia secondary to effusions             (b) pain from bone lesions and left humeral and vertebral compression fractures             (c) right upper extremity lymphedema             (d) poor venous access   (4) genetics testing on 12/25/2018 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 STK11, TP53, TSC1, TSC2, and VHL.  The following genes were evaluated for sequence changes only: SDHA and HOXB13 c.251G>A variant only. The [facility] Melanoma Panel analyzed the following 9 genes: BAP1 BRCA2 CDK4 CDKN2A MITF POT1 PTEN RB1 Tp53.    (5) palliative radiation to the left humerus 07/20/2019 - 07/31/2019 Site Technique Total Dose (Gy) Dose per Fx (Gy) Completed Fx Beam Energies  Humerus, Left: Ext_Lt_humerus Complex 30/30 3 10/10 6X    (6) anemia requiring transfusion:              (a) B-12 level 134 FEB 2022: B-12 injectons started              (B) iron deficient, IV iron to start 2/6  (C) Hospitalized with upper GI bleed 05/27/2022-05/30/2022  (7) Elacestrant started 04/30/2022  INTERVAL HISTORY:  [name]  [place] 62 y.o. female returns for follow-up.   Since last visit here, she had to go to the ER with severe epigastric pain. She was recommended to take protonix 40 mg BID. She says she took spicy food before the pain. She has been taking orserdu as prescribed. She is to see dermatologist 4/8 for psoriasis. She is taking atarax for itching as needed in the interim. No change in breathing. She has noticed some constipation since last week but admits to not eating much of GERD/Peptic ulcer disease. No change in urinary habits No neurological complaints.  Rest of the pertinent 10 point ROS reviewed and neg.   Patient Active Problem List   Diagnosis Date Noted   UGI bleed 05/27/2022   Acute prerenal azotemia 04/18/2022   Dehydration 04/18/2022   SIRS (systemic inflammatory response syndrome) 04/18/2022   Allergic rhinitis 04/18/2022   Depression 04/18/2022   Anemia of chronic disease 04/18/2022   Bronchitis  04/18/2022   Acute bronchitis 04/17/2022   Intertrigo 01/10/2022   Bacterial vaginitis 01/10/2022   Sepsis due to cellulitis 12/06/2021   B12 deficiency anemia 08/15/2020   Sensorineural hearing loss (SNHL) of both ears 08/07/2019   Intractable back pain 02/03/2019   Thoracic compression fracture, closed, initial encounter 02/03/2019   Essential hypertension 02/03/2019   Genetic testing 01/27/2019   Acute pharyngitis    Bacteremia    FTT (failure to thrive) in adult    Fracture closed, humerus, shaft 01/09/2019   Pneumonia 12/25/2018   Acute respiratory disorder in immunocompromised patient 12/25/2018   Family history of breast cancer    Family history of prostate cancer    Family history of melanoma    Family history of colon cancer    Pressure injury of skin 11/24/2018   Chronic respiratory failure with hypoxia and hypercapnia 11/19/2018   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   Hypophosphatemia 11/19/2018   Hypocalcemia 11/19/2018   Aspiration pneumonia 11/19/2018   SOB (shortness of breath)    Palliative care by specialist    Carcinoma of breast, estrogen receptor positive, stage 4, right 11/16/2018   Hypoalbuminemia 11/16/2018   Pleural effusion 11/14/2018   Goals of care, counseling/discussion 11/06/2018   Malignant neoplasm of overlapping sites of right breast in female, estrogen receptor positive 10/31/2018   Lung metastasis 10/31/2018   Pain from bone metastases 10/31/2018   Class 2 obesity 10/31/2018   Bone injury    Pleural effusion, bilateral    Shortness of breath    Abnormal breast finding    Hypokalemia    Breast skin changes    Macrocytic anemia    Malignant neoplasm metastatic to bone    Anemia, B12 deficiency 10/26/2018    is allergic to ferumoxytol.  MEDICAL HISTORY: Past Medical History:  Diagnosis Date   Cancer [facility])    Family history of breast cancer    Family history of colon cancer    Family history of melanoma    Family history of  prostate cancer    Gallstones    Glaucoma    Mastitis    reports history of recurrent mastitis   Metastatic breast cancer    Obesity     SURGICAL HISTORY: Past Surgical History:  Procedure Laterality Date   ESOPHAGOGASTRODUODENOSCOPY (EGD) WITH PROPOFOL N/A 05/27/2022   Procedure: ESOPHAGOGASTRODUODENOSCOPY (EGD) WITH PROPOFOL;  Surgeon: Arta Silence, MD;  Location: [facility] END[facility]COPY;  Service: Gastroenterology;  Laterality: N/A;   GLAUCOMA SURGERY  late 1990's   IR THORACENTESIS ASP PLEURAL SPACE W/IMG GUIDE  10/28/2018   IR THORACENTESIS ASP PLEURAL SPACE W/IMG GUIDE  10/29/2018   OPEN REDUCTION INTERNAL FIXATION (ORIF) DISTAL RADIAL FRACTURE Left 01/09/2019   Procedure: OPEN REDUCTION INTERNAL FIXATION (ORIF) HUMERAL FRACTURE;  Surgeon: Netta Cedars, MD;  Location: [facility] ORS;  Service: Orthopedics;  Laterality: Left;    SOCIAL HISTORY: Social History   Socioeconomic History   Marital status: Widowed    Spouse name: Not on file   Number of children: 1   Years of education: Not on file   Highest education level: Not on file  Occupational History   Occupation: Clinical research associate Programs    Comment: Self-employed  Tobacco Use   Smoking status: Never   Smokeless tobacco: Never   Tobacco comments:    second hand smoke exposure  Vaping Use   Vaping Use: Never used  Substance and Sexual Activity   Alcohol use: No   Drug use: No   Sexual activity: Not Currently  Other Topics Concern   Not on file  Social History Narrative   Has a daughter named [name]. Daughter has CP.   Social Determinants of Health   Financial Resource Strain: Low Risk  (08/16/2021)   Overall Financial Resource Strain (CARDIA)    Difficulty of Paying Living Expenses: Not hard at all  Food Insecurity: No Food Insecurity (05/27/2022)   Hunger Vital Sign    Worried About Running Out of Food in the Last Year: Never true    Ran Out of Food in the Last Year: Never true  Transportation Needs: No Transportation  Needs (05/27/2022)   PRAPARE - Hydrologist (Medical): No    Lack of Transportation (Non-Medical): No  Physical Activity: Not on file  Stress: Stress Concern Present (11/08/2021)   [address]    Feeling of Stress : To some extent  Social Connections: Socially Isolated (03/27/2021)   Social Connection and Isolation Panel [NHANES]    Frequency of Communication with Friends and Family: Once a week    Frequency of Social Gatherings with Friends and Family: More than three times a week    Attends Religious Services: Never    Marine scientist or Organizations: No    Attends Archivist Meetings: Never    Marital Status: Widowed  Intimate Partner Violence: Not At Risk (05/27/2022)   Humiliation, Afraid, Rape, and Kick questionnaire    Fear of Current or Ex-Partner: No    Emotionally Abused: No    Physically Abused: No    Sexually Abused: No    FAMILY HISTORY: Family History  Problem Relation Age of Onset   Breast cancer Sister        in her 40's-70s   Heart disease Brother        CABG   Heart disease Brother        CABG   Skin cancer Brother        melanoma   Colon cancer Cousin    Heart attack Mother    Heart attack Father    Prostate cancer Brother     Review of Systems  Constitutional:  Negative for appetite change, chills, fatigue, fever and unexpected weight change.  HENT:   Negative for hearing loss, lump/mass and trouble swallowing.   Eyes:  Negative for eye problems and icterus.  Respiratory:  Positive for shortness of breath (at baseline). Negative for chest tightness and cough.   Cardiovascular:  Negative for chest pain, leg swelling  and palpitations.  Gastrointestinal:  Negative for abdominal distention, abdominal pain, constipation, diarrhea, nausea and vomiting.  Endocrine: Negative for hot flashes.  Genitourinary:  Negative for difficulty urinating.    Musculoskeletal:  Negative for arthralgias.  Skin:  Negative for itching and rash.  Neurological:  Negative for dizziness, extremity weakness, headaches and numbness.  Hematological:  Negative for adenopathy. Does not bruise/bleed easily.  Psychiatric/Behavioral:  Negative for depression. The patient is not nervous/anxious.       PHYSICAL EXAMINATION  ECOG PERFORMANCE STATUS: 2 - Symptomatic, <50% confined to bed  Vitals:   08/28/22 1306  BP: 133/69  Pulse: (!) 108  Resp: 16  Temp: 97.8 F (36.6 C)  SpO2: 100%    Physical Exam Constitutional:      General: She is not in acute distress.    Appearance: Normal appearance. She is not toxic-appearing.  HENT:     Head: Normocephalic and atraumatic.  Eyes:     General: No scleral icterus. Cardiovascular:     Rate and Rhythm: Normal rate and regular rhythm.     Pulses: Normal pulses.     Heart sounds: Normal heart sounds.  Pulmonary:     Effort: Pulmonary effort is normal.     Breath sounds: Normal breath sounds.  Abdominal:     General: Abdomen is flat. Bowel sounds are normal. There is no distension.     Palpations: Abdomen is soft.     Tenderness: There is no abdominal tenderness.  Musculoskeletal:        General: No swelling.     Cervical back: Neck supple.  Lymphadenopathy:     Cervical: No cervical adenopathy.  Skin:    General: Skin is warm and dry.     Findings: Rash (persistent underneath right breast--improved according to the patient and daughter) present.  Neurological:     General: No focal deficit present.     Mental Status: She is alert.  Psychiatric:        Mood and Affect: Mood normal.        Behavior: Behavior normal.     LABORATORY DATA:  CBC    Component Value Date/Time   WBC 5.2 08/28/2022 1252   WBC 7.2 08/27/2022 0145   RBC 3.81 (L) 08/28/2022 1252   HGB 9.7 (L) 08/28/2022 1252   HCT 33.2 (L) 08/28/2022 1252   PLT 296 08/28/2022 1252   MCV 87.1 08/28/2022 1252   MCH 25.5 (L)  08/28/2022 1252   MCHC 29.2 (L) 08/28/2022 1252   RDW 17.2 (H) 08/28/2022 1252   LYMPHSABS 0.9 08/28/2022 1252   MONOABS 0.3 08/28/2022 1252   EOSABS 0.2 08/28/2022 1252   BASOSABS 0.0 08/28/2022 1252    CMP     Component Value Date/Time   NA 141 08/27/2022 0145   K 3.5 08/27/2022 0145   CL 106 08/27/2022 0145   CO2 26 08/27/2022 0145   GLUCOSE 130 (H) 08/27/2022 0145   BUN 8 08/27/2022 0145   CREATININE 0.80 08/27/2022 0145   CREATININE 0.89 07/31/2022 1518   CALCIUM 8.3 (L) 08/27/2022 0145   PROT 7.0 08/27/2022 0145   ALBUMIN 3.3 (L) 08/27/2022 0145   AST 36 08/27/2022 0145   AST 29 07/31/2022 1518   ALT 18 08/27/2022 0145   ALT 13 07/31/2022 1518   ALKPHOS 94 08/27/2022 0145   BILITOT 0.4 08/27/2022 0145   BILITOT 0.4 07/31/2022 1518   GFRNONAA >60 08/27/2022 0145   GFRNONAA >60 07/31/2022 1518   GFRAA >60 02/24/2020  [place] 07/24/2019 1453   ASSESSMENT and THERAPY PLAN:   PRABHJOT TUCHSCHERER is a 62 y.o. female who presents to the clinic for follow up for metastatic breast cancer to the bone.    #Metastatic breast cancer involving the bone:   S/P first line Palbociclib with AI. We started her on Faslodex while we were awaiting molecular testing. She was on Ibrance for almost 2 yrs. We have suspected that there could be some progression ongoing given her rising tumor markers and since she has been on this combination for almost 2+ years. Biopsy confirmed metastatic breast cancer ER, PR positive, Her 2 neg. 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 She is now on orserdu, she had interruptions to treatment, took about a month, then had some hospitalization, treatment was also interrupted by delay in obtaining medication. At this time since patient is she had several interruptions with treatment, we have discussed  about considering continuing this for another 2 months and reimage versus transitioning to oral chemotherapy Xeloda.  Given the duration of response on Ibrance and AI, I wonder if she may have a good response to Orserdu as well.  She remains asymptomatic from her disease. Repeat imaging ordered for mid April. No concerns on ROS or PE for disease progression.  With regards to the caliectasis and possible thrombus in the kidney, have had a conversation with the radiologist and he suggested to follow-up with urine analysis to see if she has any hematuria.  We could also consider referral to urology if she has any persistent hematuria. She has no hematuria, last UA neg. We will review this findings on repeat imaging and assess need for referral to hematology.  I believe her overall prognosis is mostly guarded by the metastatic breast cancer hence we will consider referrals as needed  GERD, on protonix 40 mg BID, bland diet, use pepcid PRN. Please follow up with gastroenterology.  Psoriasis, derm appt on 4/8. In the interim using hydroxzine PRN for itching  RTC in 4 weeks.  Total encounter time 30 minutes*in face-to-face visit time, chart review, lab review, care coordination, order entry, and documentation of the encounter time.  *Total Encounter Time as defined by the Centers for Medicare and Medicaid Services includes, in addition to the face-to-face time of a patient visit (documented in the note above) non-face-to-face time: obtaining and reviewing outside history, ordering and reviewing medications, tests or procedures, care coordination (communications with other health care professionals or caregivers) and documentation in the medical record.

## 2022-08-29 DIAGNOSIS — C50811 Malignant neoplasm of overlapping sites of right female breast: Secondary | ICD-10-CM | POA: Diagnosis not present

## 2022-08-29 LAB — CANCER ANTIGEN 27.29: CA 27.29: 1010.7 U/mL — ABNORMAL HIGH (ref 0.0–38.6)

## 2022-08-31 DIAGNOSIS — C50811 Malignant neoplasm of overlapping sites of right female breast: Secondary | ICD-10-CM | POA: Diagnosis not present

## 2022-09-03 ENCOUNTER — Ambulatory Visit (INDEPENDENT_AMBULATORY_CARE_PROVIDER_SITE_OTHER): Payer: Medicaid Other | Admitting: Dermatology

## 2022-09-03 VITALS — BP 115/76

## 2022-09-03 DIAGNOSIS — L409 Psoriasis, unspecified: Secondary | ICD-10-CM | POA: Diagnosis not present

## 2022-09-03 MED ORDER — TRIAMCINOLONE ACETONIDE 0.1 % EX OINT: 1.0000 | TOPICAL_OINTMENT | Freq: Two times a day (BID) | CUTANEOUS | 0 refills | Status: DC

## 2022-09-03 MED ORDER — TACROLIMUS 0.1 % EX OINT: TOPICAL_OINTMENT | Freq: Two times a day (BID) | CUTANEOUS | 0 refills | Status: AC

## 2022-09-03 NOTE — Progress Notes (Signed)
   New Patient Visit   Subjective  Suzanne [name] is a 62 y.o. female who presents for the following: Diagnosed with intertrigo in 2020. About a year ago, the medication stopped working so her surgeon did a biopsy and it was positive for psoriasis. It has since started spreading to arms, legs, hands, feet, lower back.  Accompanied by daughter  The following portions of the chart were reviewed this encounter and updated as appropriate: medications, allergies, medical history  Review of Systems:  No other skin or systemic complaints except as noted in HPI or Assessment and Plan.  Objective  Well appearing patient in no apparent distress; mood and affect are within normal limits.  An examination was performed including scalp, head, eyes, ears, nose, lips, neck, chest, axillae, abdomen, back, buttocks, bilateral upper extremities, bilateral lower extremities, hands, feet, fingers, toes, fingernails, and toenails. All findings within normal limits unless otherwise noted below.   Relevant exam findings are noted in the Assessment and Plan.    Assessment & Plan   PSORIASIS Exam: Well-demarcated erythematous papules/plaques with silvery scale, guttate pink scaly papules.   Flared  patient denies joint pain  Psoriasis is a chronic non-curable, but treatable genetic/hereditary disease that may have other systemic features affecting other organ systems such as joints (Psoriatic Arthritis). It is associated with an increased risk of inflammatory bowel disease, heart disease, non-alcoholic fatty liver disease, and depression.  Treatments include light and laser treatments; topical medications; and systemic medications including oral and injectables.  Treatment Plan: Apply triamcinolone 0.1% cream BID for 2 weeks then stop, alternate with, Tacrolimus Ointment BID for 2 weeks --> keep alternating until clear  We briefly discussed systemic agents as long term management options if she fails  topical therapy or if joint pain develops     Return in about 6 weeks (around 10/15/2022).  I, Joanie Coddington, CMA, am acting as scribe for Langston Reusing, MD .   Documentation: I have reviewed the above documentation for accuracy and completeness, and I agree with the above.  Langston Reusing, MD

## 2022-09-03 NOTE — Patient Instructions (Signed)
Triamcinolone 2% ointment twice daily to affected areas of psoriasis for 2 weeks. Take a 2 weeks break before restarting. On the off weeks, apply tacrolimus ointment twice daily to all areas of psoriasis.  I counseled the patient regarding the following: Skin care: Emollients, ambient sun exposure, shampoos with tar, selenium or zinc pyrithione can improve psoriasis. Expectations: Psoriasis is chronic in nature with periods of remissions and flares. Flares can be triggered by stress, infections (group A strep), certain medications and alcohol. Contact office if: Psoriasis worsens, or fails to improve despite several months of treatment.   Due to recent changes in healthcare laws, you may see results of your pathology and/or laboratory studies on MyChart before the doctors have had a chance to review them. We understand that in some cases there may be results that are confusing or concerning to you. Please understand that not all results are received at the same time and often the doctors may need to interpret multiple results in order to provide you with the best plan of care or course of treatment. Therefore, we ask that you please give [place] 2 business days to thoroughly review all your results before contacting the office for clarification. Should we see a critical lab result, you will be contacted sooner.   If You Need Anything After Your Visit  If you have any questions or concerns for your doctor, please call our main line at [phone] If no one answers, please leave a voicemail as directed and we will return your call as soon as possible. Messages left after 4 pm will be answered the following business day.   You may also send [place] a message via MyChart. We typically respond to MyChart messages within 1-2 business days.  For prescription refills, please ask your pharmacy to contact our office. Our fax number is [phone].  If you have an urgent issue when the clinic is closed that cannot  wait until the next business day, you can page your doctor at the number below.    Please note that while we do our best to be available for urgent issues outside of office hours, we are not available 24/7.   If you have an urgent issue and are unable to reach [place], you may choose to seek medical care at your doctor's office, retail clinic, urgent care center, or emergency room.  If you have a medical emergency, please immediately call 911 or go to the emergency department. In the event of inclement weather, please call our main line at [phone] for an update on the status of any delays or closures.  Dermatology Medication Tips: Please keep the boxes that topical medications come in in order to help keep track of the instructions about where and how to use these. Pharmacies typically print the medication instructions only on the boxes and not directly on the medication tubes.   If your medication is too expensive, please contact our office at [phone] or send [place] a message through MyChart.   We are unable to tell what your co-pay for medications will be in advance as this is different depending on your insurance coverage. However, we may be able to find a substitute medication at lower cost or fill out paperwork to get insurance to cover a needed medication.   If a prior authorization is required to get your medication covered by your insurance company, please allow [place] 1-2 business days to complete this process.  Drug prices often vary depending on where the prescription is  filled and some pharmacies may offer cheaper prices.  The website www.goodrx.com contains coupons for medications through different pharmacies. The prices here do not account for what the cost may be with help from insurance (it may be cheaper with your insurance), but the website can give you the price if you did not use any insurance.  - You can print the associated coupon and take it with your prescription to the  pharmacy.  - You may also stop by our office during regular business hours and pick up a GoodRx coupon card.  - If you need your prescription sent electronically to a different pharmacy, notify our office through [facility] MyChart or by phone at [phone]

## 2022-09-05 ENCOUNTER — Other Ambulatory Visit: Payer: Self-pay

## 2022-09-05 ENCOUNTER — Telehealth: Payer: Self-pay

## 2022-09-05 DIAGNOSIS — C50811 Malignant neoplasm of overlapping sites of right female breast: Secondary | ICD-10-CM | POA: Diagnosis not present

## 2022-09-05 MED ORDER — ONDANSETRON 4 MG PO TBDP: 4.0000 mg | ORAL_TABLET | Freq: Three times a day (TID) | ORAL | 0 refills | Status: DC | PRN

## 2022-09-05 NOTE — Telephone Encounter (Signed)
Received call from [place], pt's daughter stating pt is experiencing gastric pain/upset (nausea) anytime she eats/drinks and when she takes her Orserdu.  Per MD, she was advised to take protonix BID and Zofran ODT 30 min before meal and take Orserdu with last meal of the day. She needs to see one of our providers biweekly as she has many ongoing concerns. Spoke with Bethel Born, pharmacist who will have phone visit with pt. Message sent to scheduling to have pt come in next week for eval.

## 2022-09-06 DIAGNOSIS — C50811 Malignant neoplasm of overlapping sites of right female breast: Secondary | ICD-10-CM | POA: Diagnosis not present

## 2022-09-07 DIAGNOSIS — C50811 Malignant neoplasm of overlapping sites of right female breast: Secondary | ICD-10-CM | POA: Diagnosis not present

## 2022-09-10 ENCOUNTER — Other Ambulatory Visit: Payer: Self-pay | Admitting: *Deleted

## 2022-09-10 DIAGNOSIS — C50811 Malignant neoplasm of overlapping sites of right female breast: Secondary | ICD-10-CM | POA: Diagnosis not present

## 2022-09-11 ENCOUNTER — Encounter: Payer: Self-pay | Admitting: Hematology and Oncology

## 2022-09-12 DIAGNOSIS — C50811 Malignant neoplasm of overlapping sites of right female breast: Secondary | ICD-10-CM | POA: Diagnosis not present

## 2022-09-13 ENCOUNTER — Encounter (HOSPITAL_COMMUNITY): Payer: Self-pay

## 2022-09-13 ENCOUNTER — Other Ambulatory Visit: Payer: Self-pay

## 2022-09-13 ENCOUNTER — Emergency Department (HOSPITAL_COMMUNITY)
Admission: EM | Admit: 2022-09-13 | Discharge: 2022-09-14 | Disposition: A | Discharge status: Home / Self Care | Payer: Medicaid Other | Attending: Emergency Medicine | Admitting: Emergency Medicine

## 2022-09-13 ENCOUNTER — Encounter: Payer: Self-pay | Admitting: Hematology and Oncology

## 2022-09-13 DIAGNOSIS — C50811 Malignant neoplasm of overlapping sites of right female breast: Secondary | ICD-10-CM | POA: Diagnosis not present

## 2022-09-13 DIAGNOSIS — R1084 Generalized abdominal pain: Secondary | ICD-10-CM | POA: Diagnosis not present

## 2022-09-13 DIAGNOSIS — R1030 Lower abdominal pain, unspecified: Secondary | ICD-10-CM | POA: Diagnosis not present

## 2022-09-13 DIAGNOSIS — R1111 Vomiting without nausea: Secondary | ICD-10-CM | POA: Diagnosis not present

## 2022-09-13 DIAGNOSIS — K8020 Calculus of gallbladder without cholecystitis without obstruction: Secondary | ICD-10-CM | POA: Diagnosis not present

## 2022-09-13 DIAGNOSIS — N201 Calculus of ureter: Secondary | ICD-10-CM | POA: Insufficient documentation

## 2022-09-13 DIAGNOSIS — R110 Nausea: Secondary | ICD-10-CM | POA: Diagnosis not present

## 2022-09-13 DIAGNOSIS — N132 Hydronephrosis with renal and ureteral calculous obstruction: Secondary | ICD-10-CM | POA: Diagnosis not present

## 2022-09-13 LAB — CBC
HCT: 35.7 % — ABNORMAL LOW (ref 36.0–46.0)
Hemoglobin: 10.5 g/dL — ABNORMAL LOW (ref 12.0–15.0)
MCH: 25.5 pg — ABNORMAL LOW (ref 26.0–34.0)
MCHC: 29.4 g/dL — ABNORMAL LOW (ref 30.0–36.0)
MCV: 86.9 fL (ref 80.0–100.0)
Platelets: 311 10*3/uL (ref 150–400)
RBC: 4.11 MIL/uL (ref 3.87–5.11)
RDW: 17.2 % — ABNORMAL HIGH (ref 11.5–15.5)
WBC: 8.5 10*3/uL (ref 4.0–10.5)
nRBC: 0 % (ref 0.0–0.2)

## 2022-09-13 LAB — COMPREHENSIVE METABOLIC PANEL
ALT: 20 U/L (ref 0–44)
AST: 33 U/L (ref 15–41)
Albumin: 3.4 g/dL — ABNORMAL LOW (ref 3.5–5.0)
Alkaline Phosphatase: 114 U/L (ref 38–126)
Anion gap: 7 (ref 5–15)
BUN: 16 mg/dL (ref 8–23)
CO2: 27 mmol/L (ref 22–32)
Calcium: 8.7 mg/dL — ABNORMAL LOW (ref 8.9–10.3)
Chloride: 103 mmol/L (ref 98–111)
Creatinine, Ser: 1.85 mg/dL — ABNORMAL HIGH (ref 0.44–1.00)
GFR, Estimated: 30 mL/min — ABNORMAL LOW (ref >60)
Glucose, Bld: 140 mg/dL — ABNORMAL HIGH (ref 70–99)
Potassium: 3.8 mmol/L (ref 3.5–5.1)
Sodium: 137 mmol/L (ref 135–145)
Total Bilirubin: 0.7 mg/dL (ref 0.3–1.2)
Total Protein: 7.7 g/dL (ref 6.5–8.1)

## 2022-09-13 LAB — LIPASE, BLOOD: Lipase: 33 U/L (ref 11–51)

## 2022-09-13 MED ORDER — MORPHINE SULFATE (PF) 4 MG/ML IV SOLN
4.0000 mg | Freq: Once | INTRAVENOUS | Status: AC
  Administered 2022-09-14: 4 mg via INTRAVENOUS
  Filled 2022-09-13: qty 1

## 2022-09-13 NOTE — ED Triage Notes (Signed)
Patient presents from home due to lower abdominal pain near her groin that radiate to the low back. Pain is sharp and constant in nature. She also complains of nausea and vomiting. All symptoms began suddenly 2 hours ago.     EMS vitals: 134/84 BP 96 HR 99 % SPO2 on 2 L nasal canula 125 CBG

## 2022-09-13 NOTE — ED Provider Notes (Signed)
[place] EMERGENCY DEPARTMENT AT [facility] Provider Note   CSN: [id number] Arrival date & time: 09/13/22  2235     History  Chief Complaint  Patient presents with   Abdominal Pain    Suzanne [name] is a 62 y.o. female.  The history is provided by the patient and medical records.  Abdominal Pain  62 y.o. F with hx of anemia, metastatic breast cancer, obesity, presenting to the ED for lower abdominal pain.  States it started about 3 hours PTA.  She reports some nausea and emesis x1. No diarrhea, has actually felt a little constipated.  No fever/chills.  Some urinary frequency earlier today but no dysuria.  No prior abdominal surgeries.  No meds PTA.  She is still having oral chemotherapy along with Xgeva.  Home Medications Prior to Admission medications   Medication Sig Start Date End Date Taking? Authorizing Provider  ondansetron (ZOFRAN-ODT) 4 MG disintegrating tablet Take 1 tablet (4 mg total) by mouth every 8 (eight) hours as needed for nausea or vomiting. 09/05/22   [name], MD  acetaminophen (TYLENOL) 500 MG tablet Take 1,000 mg by mouth daily as needed for mild pain.    [provider]  cetirizine (ZYRTEC) 10 MG tablet Take 10 mg by mouth daily.    [provider]  Denosumab (XGEVA [place]) Inject 1 Dose into the skin every 30 (thirty) days.    [provider]  elacestrant hydrochloride (ORSERDU) 345 MG tablet Take 1 tablet (345 mg total) by mouth daily. Take with food. 04/25/22   [name], MD  famotidine (PEPCID) 20 MG tablet Take 1 tablet (20 mg total) by mouth 2 (two) times daily as needed for heartburn or indigestion. 07/28/20   [name], [name], MD  folic acid (FOLVITE) 1 MG tablet Take 1 tablet (1 mg total) by mouth daily. 09/29/21   [name], MD  furosemide (LASIX) 20 MG tablet Take 1 tablet (20 mg total) by mouth daily. 09/29/21   [name], MD  hydrOXYzine (ATARAX) 10 MG tablet Take 1-2 tablets (10-20 mg  total) by mouth 3 (three) times daily as needed. Patient not taking: Reported on 08/27/2022 08/14/22   [name], NP  ipratropium-albuterol (DUONEB) 0.5-2.5 (3) MG/3ML SOLN Take 3 mLs by nebulization every 6 (six) hours as needed. Patient taking differently: Take 3 mLs by nebulization every 6 (six) hours as needed (sob/wheezing). 05/03/22   [name], MD  loperamide (IMODIUM) 2 MG capsule Take 2 mg by mouth daily as needed for diarrhea or loose stools.    [provider]  MELATONIN TR 1 MG TBCR TAKE ONE TABLET BY MOUTH EVERYDAY AT BEDTIME Patient not taking: Reported on 08/27/2022 04/03/22   [name], MD  ondansetron (ZOFRAN) 8 MG tablet Take 1 tablet (8 mg total) by mouth every 8 (eight) hours as needed for nausea or vomiting. Patient not taking: Reported on 08/14/2022 02/26/22   [name], MD  OYSTER SHELL CALCIUM PLUS D 500-5 MG-MCG TABS TAKE ONE TABLET BY MOUTH EVERY MORNING and TAKE ONE TABLET BY MOUTH AT NOON and TAKE ONE TABLET BY MOUTH EVERY EVENING Patient taking differently: Take 1 tablet by mouth 2 (two) times daily. 04/27/21   [name], [name], MD  pantoprazole (PROTONIX) 40 MG tablet Take 1 tablet (40 mg total) by mouth 2 (two) times daily. 05/30/22 08/28/22  [name], MD  tacrolimus (PROTOPIC) 0.1 % ointment Apply topically 2 (two) times daily. Apply to affected areas of psoriasis on  the days not using triamcinolone. 09/03/22   [name], MD  traMADol (ULTRAM) 50 MG tablet Take 50 mg by mouth every 6 (six) hours as needed for moderate pain or severe pain.    [provider]  triamcinolone ointment (KENALOG) 0.1 % Apply 1 Application topically 2 (two) times daily. Apply to affected areas of psoriasis x 2 weeks. Take a 2 weeks break before restarting. 09/03/22   [name], MD      Allergies    Ferumoxytol    Review of Systems   Review of Systems  Gastrointestinal:  Positive for abdominal pain.  All other systems reviewed and are  negative.   Physical Exam Updated Vital Signs BP (!) 151/89 (BP Location: Left Arm)   Pulse 83   Temp 98.2 F (36.8 C) (Oral)   Resp 17   Ht  (1.499 m)   Wt 79.4 kg   LMP 05/28/2013 (Within Months)   SpO2 100%   BMI 35.35 kg/m   Physical Exam Vitals and nursing note reviewed.  Constitutional:      Appearance: She is well-developed.  HENT:     Head: Normocephalic and atraumatic.  Eyes:     Conjunctiva/sclera: Conjunctivae normal.     Pupils: Pupils are equal, round, and reactive to light.  Cardiovascular:     Rate and Rhythm: Normal rate and regular rhythm.     Heart sounds: Normal heart sounds.  Pulmonary:     Effort: Pulmonary effort is normal.     Breath sounds: Normal breath sounds.  Abdominal:     General: Bowel sounds are normal.     Palpations: Abdomen is soft.     Tenderness: There is abdominal tenderness in the right lower quadrant, suprapubic area and left lower quadrant.     Comments: Obese abdomen, soft, some mild tenderness along the lower abdomen without guarding  Musculoskeletal:        General: Normal range of motion.     Cervical back: Normal range of motion.  Skin:    General: Skin is warm and dry.     Comments: Psoriatic rash across the abdomen, chronic   Neurological:     Mental Status: She is alert and oriented to person, place, and time.     ED Results / Procedures / Treatments   Labs (all labs ordered are listed, but only abnormal results are displayed) Labs Reviewed  COMPREHENSIVE METABOLIC PANEL - Abnormal; Notable for the following components:      Result Value   Glucose, Bld 140 (*)    Creatinine, Ser 1.85 (*)    Calcium 8.7 (*)    Albumin 3.4 (*)    GFR, Estimated 30 (*)    All other components within normal limits  CBC - Abnormal; Notable for the following components:   Hemoglobin 10.5 (*)    HCT 35.7 (*)    MCH 25.5 (*)    MCHC 29.4 (*)    RDW 17.2 (*)    All other components within normal limits  URINALYSIS,  ROUTINE W REFLEX MICROSCOPIC - Abnormal; Notable for the following components:   APPearance HAZY (*)    Hgb urine dipstick MODERATE (*)    Bilirubin Urine SMALL (*)    Protein, ur 100 (*)    Leukocytes,Ua MODERATE (*)    Bacteria, UA FEW (*)    All other components within normal limits  URINE CULTURE  LIPASE, BLOOD    EKG None  Radiology CT ABDOMEN PELVIS WO  CONTRAST  Result Date: 09/14/2022 CLINICAL DATA:  Abdominal pain, acute, nonlocalized lower abdominal pain, known metastatic cancer EXAM: CT ABDOMEN AND PELVIS WITHOUT CONTRAST TECHNIQUE: Multidetector CT imaging of the abdomen and pelvis was performed following the standard protocol without IV contrast. RADIATION DOSE REDUCTION: This exam was performed according to the departmental dose-optimization program which includes automated exposure control, adjustment of the mA and/or kV according to patient size and/or use of iterative reconstruction technique. COMPARISON:  CT abdomen pelvis 07/12/2022 FINDINGS: Lower chest: Grossly stable innumerable subpleural nodules bilaterally. Left anterior descending coronary calcification. At least moderate volume hiatal hernia. Hepatobiliary: Slightly more conspicuous and possibly slightly increased in size innumerable hepatic masses with as an example a 2.4 x 3 cm right posterior hepatic mass (3:12). Calcified gallstones within the gallbladder lumen. No gallbladder wall thickening or pericholecystic fluid. Nonspecific hydropic gallbladder. No biliary dilatation. Pancreas: No focal lesion. Normal pancreatic contour. No surrounding inflammatory changes. No main pancreatic ductal dilatation. Spleen: Normal in size without focal abnormality. Adrenals/Urinary Tract: No adrenal nodule bilaterally. Bilateral kidneys enhance symmetrically. Asymmetric left perinephric fat stranding with associated left mild hydronephrosis. Associated 4 mm calcified stone at the left ureterovesicular junction. No left  nephrolithiasis. Punctate right nephrolithiasis. No right ureterolithiasis. The urinary bladder is unremarkable. Stomach/Bowel: Stomach is within normal limits. No evidence of bowel wall thickening or dilatation. Colonic diverticulosis. Appendix appears normal. Vascular/Lymphatic: No abdominal aorta or iliac aneurysm. Moderate atherosclerotic plaque of the aorta and its branches. No abdominal, pelvic, or inguinal lymphadenopathy. Reproductive: Uterus and bilateral adnexa are unremarkable. Other: No intraperitoneal free fluid. No intraperitoneal free gas. No organized fluid collection. Musculoskeletal: No abdominal wall hernia or abnormality. Persistent diffusely sclerotic lesions of the axial and appendicular skeleton. Similar-appearing partial fusion fracture of the T11 through T1 levels with exaggerated kyphotic curvature of the thoracolumbar spine. IMPRESSION: 1. Obstructive 4 mm left ureterovesicular junction stone. Limited evaluation of this noncontrast study. Correlate with urinalysis for superimposed infection. 2. Cholelithiasis with no CT finding of acute cholecystitis. 3. At least moderate volume hiatal hernia. 4. Persistent metastatic disease. 5. Grossly stable innumerable subpleural nodules bilaterally. 6. Slightly more conspicuous and possibly slightly increased in size innumerable hepatic masses. 7. Persistent diffusely sclerotic lesions of the axial and appendicular skeleton. 8.  Aortic Atherosclerosis (ICD10-I70.0). Electronically Signed   By: Tish Frederickson M.D.   On: 09/14/2022 01:09    Procedures Procedures    Medications Ordered in ED Medications  morphine (PF) 4 MG/ML injection 4 mg (4 mg Intravenous Given 09/14/22 0002)  sodium chloride 0.9 % bolus 1,000 mL (0 mLs Intravenous Stopped 09/14/22 0315)  cefTRIAXone (ROCEPHIN) 1 g in sodium chloride 0.9 % 100 mL IVPB (0 g Intravenous Stopped 09/14/22 0201)  ondansetron (ZOFRAN) injection 4 mg (4 mg Intravenous Given 09/14/22 0314)   HYDROmorphone (DILAUDID) injection 1 mg (1 mg Intravenous Given 09/14/22 0314)  sodium chloride 0.9 % bolus 500 mL (500 mLs Intravenous New Bag/Given 09/14/22 0416)  metoCLOPramide (REGLAN) injection 10 mg (10 mg Intravenous Given 09/14/22 0422)    ED Course/ Medical Decision Making/ A&P                             Medical Decision Making Amount and/or Complexity of Data Reviewed Labs: ordered. Radiology: ordered and independent interpretation performed. ECG/medicine tests: ordered and independent interpretation performed.  Risk Prescription drug management.   62 year old female here with lower abdominal pain.  She is afebrile, non-toxic in appearance.  Some  tenderness along lower abdomen, seems a bit worse suprapubic region.  Reports some frequency earlier today, no dysuria.  Will check labs, CT.  Labs as above-- no leukocytosis.  Does have AKI, SrCr 1.85, baseline 0.8 compared with prior.  She is given IVF.  Lipase normal.  UA with moderate leuks, 21-50 WBCs, few bacteria.  Given IV rocephin.  CT with obstructing left UVJ stone.  Given these findings, will discuss with urology for recommendations.  1:31 AM Spoke with on call urology, Dr. Benancio Deeds-- we discussed her PMHx, labs, CT from today's visit.  She is without fever or leukocytosis, HD stable.  Does not feel she necessarily needs admission given stone is distal.  Would like [place] to send catheterized urine culture, agrees with IV rocephin, d/c home with keflex BID and flomax, urine strainer.  Will see in office on Monday.  Patient did have some transient vomiting, resolved after IV reglan.  She is tolerating oral fluids now without difficulty.  Will d/c home with plan as per urology.  Can return here for any new/acute changes.  Final Clinical Impression(s) / ED Diagnoses Final diagnoses:  Left ureteral stone    Rx / DC Orders ED Discharge Orders          Ordered    oxyCODONE-acetaminophen (PERCOCET) 5-325 MG tablet  Every 4  hours PRN        09/14/22 0508    ondansetron (ZOFRAN-ODT) 4 MG disintegrating tablet  Every 8 hours PRN        09/14/22 0508    tamsulosin (FLOMAX) 0.4 MG CAPS capsule  Daily after supper        09/14/22 0508    cephALEXin (KEFLEX) 500 MG capsule  2 times daily        09/14/22 0550              Garlon Hatchet, PA-C 09/14/22 0551    Palumbo, April, MD 09/14/22 [phone]

## 2022-09-14 ENCOUNTER — Other Ambulatory Visit: Payer: Self-pay | Admitting: *Deleted

## 2022-09-14 ENCOUNTER — Telehealth: Payer: Self-pay | Admitting: *Deleted

## 2022-09-14 ENCOUNTER — Emergency Department (HOSPITAL_COMMUNITY): Payer: Medicaid Other

## 2022-09-14 DIAGNOSIS — K8020 Calculus of gallbladder without cholecystitis without obstruction: Secondary | ICD-10-CM | POA: Diagnosis not present

## 2022-09-14 DIAGNOSIS — C50811 Malignant neoplasm of overlapping sites of right female breast: Secondary | ICD-10-CM

## 2022-09-14 DIAGNOSIS — N132 Hydronephrosis with renal and ureteral calculous obstruction: Secondary | ICD-10-CM | POA: Diagnosis not present

## 2022-09-14 LAB — URINALYSIS, ROUTINE W REFLEX MICROSCOPIC
Glucose, UA: NEGATIVE mg/dL
Ketones, ur: NEGATIVE mg/dL
Nitrite: NEGATIVE
Protein, ur: 100 mg/dL — AB
RBC / HPF: >50 RBC/hpf (ref 0–5)
Specific Gravity, Urine: 1.026 (ref 1.005–1.030)
pH: 5 (ref 5.0–8.0)

## 2022-09-14 MED ORDER — SODIUM CHLORIDE 0.9 % IV SOLN
1.0000 g | Freq: Once | INTRAVENOUS | Status: AC
  Administered 2022-09-14: 1 g via INTRAVENOUS
  Filled 2022-09-14: qty 10

## 2022-09-14 MED ORDER — CEPHALEXIN 500 MG PO CAPS: 500.0000 mg | ORAL_CAPSULE | Freq: Two times a day (BID) | ORAL | 0 refills | Status: DC

## 2022-09-14 MED ORDER — TAMSULOSIN HCL 0.4 MG PO CAPS: 0.4000 mg | ORAL_CAPSULE | Freq: Every day | ORAL | 0 refills | Status: DC

## 2022-09-14 MED ORDER — SODIUM CHLORIDE 0.9 % IV BOLUS
500.0000 mL | Freq: Once | INTRAVENOUS | Status: AC
  Administered 2022-09-14: 500 mL via INTRAVENOUS

## 2022-09-14 MED ORDER — HYDROMORPHONE HCL 1 MG/ML IJ SOLN
1.0000 mg | Freq: Once | INTRAMUSCULAR | Status: AC
  Administered 2022-09-14: 1 mg via INTRAVENOUS
  Filled 2022-09-14: qty 1

## 2022-09-14 MED ORDER — ELACESTRANT HYDROCHLORIDE 345 MG PO TABS
345.0000 mg | ORAL_TABLET | Freq: Every day | ORAL | 3 refills | Status: DC
Start: 2022-09-14 — End: 2022-12-20

## 2022-09-14 MED ORDER — OXYCODONE-ACETAMINOPHEN 5-325 MG PO TABS: 1.0000 | ORAL_TABLET | ORAL | 0 refills | Status: DC | PRN

## 2022-09-14 MED ORDER — ONDANSETRON HCL 4 MG/2ML IJ SOLN
4.0000 mg | Freq: Once | INTRAMUSCULAR | Status: AC
  Administered 2022-09-14: 4 mg via INTRAVENOUS
  Filled 2022-09-14: qty 2

## 2022-09-14 MED ORDER — METOCLOPRAMIDE HCL 5 MG/ML IJ SOLN
10.0000 mg | INTRAMUSCULAR | Status: AC
  Administered 2022-09-14: 10 mg via INTRAVENOUS
  Filled 2022-09-14: qty 2

## 2022-09-14 MED ORDER — SODIUM CHLORIDE 0.9 % IV BOLUS
1000.0000 mL | Freq: Once | INTRAVENOUS | Status: AC
  Administered 2022-09-14: 1000 mL via INTRAVENOUS

## 2022-09-14 MED ORDER — ONDANSETRON 4 MG PO TBDP: 4.0000 mg | ORAL_TABLET | Freq: Three times a day (TID) | ORAL | 0 refills | Status: DC | PRN

## 2022-09-14 NOTE — Telephone Encounter (Signed)
Daughter called and says Suzanne [name] went to the ED. Had CT last night. Dr Al Pimple wants to delay additional Ct for 2 weeks and recheck labs prior to CT.

## 2022-09-14 NOTE — Discharge Instructions (Addendum)
Take the prescribed medication as directed.  Make sure to stay hydrated well at home. Follow-up with urology-- call in the morning and they will get you scheduled on Monday. Return to the ED for new or worsening symptoms.

## 2022-09-14 NOTE — ED Notes (Signed)
Pt able to drink a few sips of water without emesis. States that she is feeling better, denies pain.

## 2022-09-14 NOTE — Telephone Encounter (Signed)
Suzanne [name] also states Suzanne [name] has not had a good BM in a few days. She has been using miralax and sennakot. Dr Al Pimple suggests using a dulcolax suppository or magnesium citrate.

## 2022-09-14 NOTE — ED Notes (Signed)
Pt attempted to drink a few sips of water and started vomiting afterward. Provider notified.

## 2022-09-14 NOTE — ED Notes (Signed)
Pt states that she is going to call daughter to bring O2 tank to hospital and uber home. Waiting for daughter to arrive in room due to O2 requirement and drowsiness after pain medication administration.

## 2022-09-15 LAB — URINE CULTURE: Culture: NO GROWTH

## 2022-09-17 ENCOUNTER — Telehealth: Payer: Self-pay

## 2022-09-17 NOTE — Transitions of Care (Post Inpatient/ED Visit) (Signed)
   09/17/2022  Name: TEMARI SCHOOLER MRN: [id number] DOB: 06/03/1960  Today's TOC FU Call Status: Today's TOC FU Call Status:: Successful TOC FU Call Competed TOC FU Call Complete Date: 09/17/22  Transition Care Management Follow-up Telephone Call Date of Discharge: 09/14/22 Discharge Facility: [facility]) Type of Discharge: Emergency Department Reason for ED Visit:  (lower abdominal pain) How have you been since you were released from the hospital?: Better Any questions or concerns?: No  Items Reviewed: Did you receive and understand the discharge instructions provided?: Yes Medications obtained and verified?: Yes (Medications Reviewed) Any new allergies since your discharge?: No Dietary orders reviewed?: No  Home Care and Equipment/Supplies: Any new equipment or medical supplies ordered?: NA  Functional Questionnaire: Do you need assistance with meal preparation?: No Do you need assistance with eating?: No Do you have difficulty maintaining continence: No Do you need assistance with getting out of bed/getting out of a chair/moving?: No Do you have difficulty managing or taking your medications?: No  Follow up appointments reviewed: PCP Follow-up appointment confirmed?: NA Specialist Hospital Follow-up appointment confirmed?: Yes Date of Specialist follow-up appointment?: 09/26/22 Do you need transportation to your follow-up appointment?: No Do you understand care options if your condition(s) worsen?: Yes-patient verbalized understanding   SDOH Interventions    Flowsheet Row Telephone from 08/28/2022 in [place] POPULATION HEALTH DEPARTMENT Patient Outreach Telephone from 01/12/2022 in [facility] Community Care Coordination Patient Outreach Telephone from 11/08/2021 in [facility] Care Coordination Patient Outreach Telephone from 09/15/2021 in [facility] Care Coordination Patient Outreach Telephone from 08/16/2021  in [facility] Care Coordination Patient Outreach Telephone from 07/07/2021 in [facility] Care Coordination  SDOH Interventions        Food Insecurity Interventions -- -- Intervention Not Indicated -- -- Intervention Not Indicated  Housing Interventions -- Intervention Not Indicated -- Intervention Not Indicated -- --  Transportation Interventions -- Intervention Not Indicated -- Other (Comment)  [Reviewed [employer] transportation [phone] -- Intervention Not Indicated  Alcohol Usage Interventions Intervention Not Indicated (Score <7) -- -- -- -- --  Financial Strain Interventions -- -- -- -- Intervention Not Indicated --  Stress Interventions -- -- Patient Refused -- -- --        Gus Puma, BSW, MHA [facility]  Managed [facility] Social Worker [phone]

## 2022-09-18 ENCOUNTER — Ambulatory Visit (HOSPITAL_COMMUNITY): Payer: Medicaid Other

## 2022-09-18 DIAGNOSIS — R1084 Generalized abdominal pain: Secondary | ICD-10-CM | POA: Diagnosis not present

## 2022-09-18 DIAGNOSIS — N201 Calculus of ureter: Secondary | ICD-10-CM | POA: Diagnosis not present

## 2022-09-19 DIAGNOSIS — C50811 Malignant neoplasm of overlapping sites of right female breast: Secondary | ICD-10-CM | POA: Diagnosis not present

## 2022-09-20 ENCOUNTER — Other Ambulatory Visit: Payer: Self-pay | Admitting: Nurse Practitioner

## 2022-09-20 DIAGNOSIS — J302 Other seasonal allergic rhinitis: Secondary | ICD-10-CM

## 2022-09-20 NOTE — Telephone Encounter (Signed)
Requested medication (s) are due for refill today: yes  Requested medication (s) are on the active medication list: yes  Last refill:  unknown  Future visit scheduled: yes  Notes to clinic:  Unable to refill per protocol, last refill by another provider.  Historical medication, routing for review.     Requested Prescriptions  Pending Prescriptions Disp Refills   ALLERGY RELIEF CETIRIZINE 10 MG tablet [Pharmacy Med Name: Allergy Relief (cetirizine) 10 mg tablet] 90 tablet 3    Sig: TAKE ONE TABLET BY MOUTH DAILY     Ear, Nose, and Throat:  Antihistamines 2 Failed - 09/20/2022  4:04 PM      Failed - Cr in normal range and within 360 days    Creatinine  Date Value Ref Range Status  08/28/2022 0.86 0.44 - 1.00 mg/dL Final   Creatinine, Ser  Date Value Ref Range Status  09/13/2022 1.85 (H) 0.44 - 1.00 mg/dL Final         Passed - Valid encounter within last 12 months    Recent Outpatient Visits           1 month ago Encounter for annual physical exam   [place] [facility] [place], Shea Stakes, NP   8 months ago Encounter for Papanicolaou smear for cervical cancer screening   [facility] [place], Shea Stakes, NP   1 year ago Primary hypertension   [place] [facility] [place], Shea Stakes, NP   1 year ago Primary hypertension   [place] [facility] [place], Shea Stakes, NP   2 years ago Encounter to establish care   [facility] [facility] [name], NP       Future Appointments             In 1 month [name], NP [facility]   In 2 months Pollyann Savoy, MD [facility] Rheumatology

## 2022-09-21 DIAGNOSIS — C50811 Malignant neoplasm of overlapping sites of right female breast: Secondary | ICD-10-CM | POA: Diagnosis not present

## 2022-09-24 DIAGNOSIS — C50811 Malignant neoplasm of overlapping sites of right female breast: Secondary | ICD-10-CM | POA: Diagnosis not present

## 2022-09-25 DIAGNOSIS — C50811 Malignant neoplasm of overlapping sites of right female breast: Secondary | ICD-10-CM | POA: Diagnosis not present

## 2022-09-26 ENCOUNTER — Encounter: Payer: Self-pay | Admitting: Hematology and Oncology

## 2022-09-26 ENCOUNTER — Other Ambulatory Visit: Payer: Self-pay

## 2022-09-26 ENCOUNTER — Inpatient Hospital Stay (HOSPITAL_BASED_OUTPATIENT_CLINIC_OR_DEPARTMENT_OTHER): Payer: Medicaid Other | Admitting: Hematology and Oncology

## 2022-09-26 ENCOUNTER — Inpatient Hospital Stay: Payer: Medicaid Other | Attending: Adult Health

## 2022-09-26 ENCOUNTER — Inpatient Hospital Stay: Payer: Medicaid Other

## 2022-09-26 VITALS — BP 125/62 | HR 80 | Temp 97.5°F | Resp 18 | Ht 59.0 in | Wt 173.3 lb

## 2022-09-26 DIAGNOSIS — Z800 Family history of malignant neoplasm of digestive organs: Secondary | ICD-10-CM | POA: Insufficient documentation

## 2022-09-26 DIAGNOSIS — K219 Gastro-esophageal reflux disease without esophagitis: Secondary | ICD-10-CM | POA: Insufficient documentation

## 2022-09-26 DIAGNOSIS — Z170 Estrogen receptor positive status [ER+]: Secondary | ICD-10-CM

## 2022-09-26 DIAGNOSIS — Z808 Family history of malignant neoplasm of other organs or systems: Secondary | ICD-10-CM | POA: Insufficient documentation

## 2022-09-26 DIAGNOSIS — D518 Other vitamin B12 deficiency anemias: Secondary | ICD-10-CM

## 2022-09-26 DIAGNOSIS — C7951 Secondary malignant neoplasm of bone: Secondary | ICD-10-CM | POA: Diagnosis present

## 2022-09-26 DIAGNOSIS — Z79899 Other long term (current) drug therapy: Secondary | ICD-10-CM | POA: Insufficient documentation

## 2022-09-26 DIAGNOSIS — S22000A Wedge compression fracture of unspecified thoracic vertebra, initial encounter for closed fracture: Secondary | ICD-10-CM | POA: Diagnosis not present

## 2022-09-26 DIAGNOSIS — C50811 Malignant neoplasm of overlapping sites of right female breast: Secondary | ICD-10-CM | POA: Diagnosis present

## 2022-09-26 DIAGNOSIS — C7801 Secondary malignant neoplasm of right lung: Secondary | ICD-10-CM | POA: Diagnosis not present

## 2022-09-26 DIAGNOSIS — Z5111 Encounter for antineoplastic chemotherapy: Secondary | ICD-10-CM | POA: Insufficient documentation

## 2022-09-26 DIAGNOSIS — L409 Psoriasis, unspecified: Secondary | ICD-10-CM | POA: Insufficient documentation

## 2022-09-26 DIAGNOSIS — R0602 Shortness of breath: Secondary | ICD-10-CM | POA: Diagnosis not present

## 2022-09-26 DIAGNOSIS — J309 Allergic rhinitis, unspecified: Secondary | ICD-10-CM | POA: Diagnosis not present

## 2022-09-26 DIAGNOSIS — Z803 Family history of malignant neoplasm of breast: Secondary | ICD-10-CM | POA: Insufficient documentation

## 2022-09-26 DIAGNOSIS — J9621 Acute and chronic respiratory failure with hypoxia: Secondary | ICD-10-CM | POA: Diagnosis not present

## 2022-09-26 DIAGNOSIS — J90 Pleural effusion, not elsewhere classified: Secondary | ICD-10-CM

## 2022-09-26 DIAGNOSIS — N19 Unspecified kidney failure: Secondary | ICD-10-CM | POA: Diagnosis not present

## 2022-09-26 DIAGNOSIS — C50911 Malignant neoplasm of unspecified site of right female breast: Secondary | ICD-10-CM | POA: Diagnosis not present

## 2022-09-26 DIAGNOSIS — R6510 Systemic inflammatory response syndrome (SIRS) of non-infectious origin without acute organ dysfunction: Secondary | ICD-10-CM | POA: Diagnosis not present

## 2022-09-26 DIAGNOSIS — J40 Bronchitis, not specified as acute or chronic: Secondary | ICD-10-CM | POA: Diagnosis not present

## 2022-09-26 LAB — VITAMIN B12: Vitamin B-12: 343 pg/mL (ref 180–914)

## 2022-09-26 LAB — CMP (CANCER CENTER ONLY)
ALT: 13 U/L (ref 0–44)
AST: 24 U/L (ref 15–41)
Albumin: 3.4 g/dL — ABNORMAL LOW (ref 3.5–5.0)
Alkaline Phosphatase: 106 U/L (ref 38–126)
Anion gap: 5 (ref 5–15)
BUN: 16 mg/dL (ref 8–23)
CO2: 31 mmol/L (ref 22–32)
Calcium: 8.9 mg/dL (ref 8.9–10.3)
Chloride: 105 mmol/L (ref 98–111)
Creatinine: 0.91 mg/dL (ref 0.44–1.00)
GFR, Estimated: >60 mL/min (ref >60)
Glucose, Bld: 90 mg/dL (ref 70–99)
Potassium: 3.9 mmol/L (ref 3.5–5.1)
Sodium: 141 mmol/L (ref 135–145)
Total Bilirubin: 0.3 mg/dL (ref 0.3–1.2)
Total Protein: 6.5 g/dL (ref 6.5–8.1)

## 2022-09-26 LAB — CBC WITH DIFFERENTIAL (CANCER CENTER ONLY)
Abs Immature Granulocytes: 0.02 10*3/uL (ref 0.00–0.07)
Basophils Absolute: 0 10*3/uL (ref 0.0–0.1)
Basophils Relative: 1 %
Eosinophils Absolute: 0.2 10*3/uL (ref 0.0–0.5)
Eosinophils Relative: 3 %
HCT: 29.9 % — ABNORMAL LOW (ref 36.0–46.0)
Hemoglobin: 8.9 g/dL — ABNORMAL LOW (ref 12.0–15.0)
Immature Granulocytes: 0 %
Lymphocytes Relative: 17 %
Lymphs Abs: 0.9 10*3/uL (ref 0.7–4.0)
MCH: 25.6 pg — ABNORMAL LOW (ref 26.0–34.0)
MCHC: 29.8 g/dL — ABNORMAL LOW (ref 30.0–36.0)
MCV: 86.2 fL (ref 80.0–100.0)
Monocytes Absolute: 0.3 10*3/uL (ref 0.1–1.0)
Monocytes Relative: 6 %
Neutro Abs: 3.9 10*3/uL (ref 1.7–7.7)
Neutrophils Relative %: 73 %
Platelet Count: 290 10*3/uL (ref 150–400)
RBC: 3.47 MIL/uL — ABNORMAL LOW (ref 3.87–5.11)
RDW: 17.3 % — ABNORMAL HIGH (ref 11.5–15.5)
WBC Count: 5.4 10*3/uL (ref 4.0–10.5)
nRBC: 0 % (ref 0.0–0.2)

## 2022-09-26 LAB — FERRITIN: Ferritin: 20 ng/mL (ref 11–307)

## 2022-09-26 MED ORDER — DENOSUMAB 120 MG/1.7ML ~~LOC~~ SOLN
120.0000 mg | Freq: Once | SUBCUTANEOUS | Status: AC
  Administered 2022-09-26: 120 mg via SUBCUTANEOUS
  Filled 2022-09-26: qty 1.7

## 2022-09-26 NOTE — Progress Notes (Signed)
[facility] Follow up:   Suzanne Pounds, NP [address] [place] [address]  DIAGNOSIS: Metastatic breast cancer  SUMMARY OF ONCOLOGIC HISTORY:  Suzanne [name] woman presenting 10/26/2018 with right-sided inflammatory breast cancer, stage IV, involving lungs, lymph nodes and bones, as follows:             (a) chest CT scan 10/26/2018 shows bilateral pleural effusions, possible lymphangitic spread of tumor, diffuse bony metastatic disease, and significant axillary mediastinal and hilar adenopathy             (b) bone scan 10/27/2018 is a "near SuperScan" consistent with widespread bony metastatic disease             (c) head CT with and without contrast 10/30/2018 shows no intracranial metastatic disease, multiple calvarial lesions             (d) CA-27-29 on 10/27/2018 was 1810.4   (1) pleural fluid from right thoracentesis 10/28/2018 confirms malignant cells consistent with a breast primary, strongly estrogen and progesterone receptor positive, HER-2 not amplified, with an MIB-1 of 2%   (2) anastrozole started 10/29/2018             (a) palbociclib started 11/13/2018 at 100 mg dose             (b) denosumab/xgeva started 11/13/2018              (C) palbociclib dose decreased to 19m             (D) Palbociclib dose decreased to 75 mg every other day in 11/2021   (3) associated problems:             (a) hypoxia secondary to effusions             (b) pain from bone lesions and left humeral and vertebral compression fractures             (c) right upper extremity lymphedema             (d) poor venous access   (4) genetics testing on 12/25/2018 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 STK11, TP53, TSC1, TSC2, and VHL.  The following genes were evaluated for sequence changes only: SDHA and HOXB13 c.251G>A variant only. The [facility] Melanoma Panel analyzed the following 9 genes: BAP1 BRCA2 CDK4 CDKN2A MITF POT1 PTEN RB1 Tp53.    (5) palliative radiation to the left humerus 07/20/2019 - 07/31/2019 Site Technique Total Dose (Gy) Dose per Fx (Gy) Completed Fx Beam Energies  Humerus, Left: Ext_Lt_humerus Complex 30/30 3 10/10 6X    (6) anemia requiring transfusion:              (a) B-12 level 134 FEB 2022: B-12 injectons started              (B) iron deficient, IV iron to start 2/6  (C) Hospitalized with upper GI bleed 05/27/2022-05/30/2022  (7) Elacestrant started 04/30/2022  INTERVAL HISTORY:  Suzanne [name] y.o. female returns for follow-up.   Since her last visit here, had a visit to the ED, had a kidney stone which spontaneously passed.  Urine Culture was negative.  CT abdomen and pelvis done in the ED showed grossly stable subpleural nodules bilaterally, slightly more conspicuous and possibly slightly increased size of innumerable hepatic metastasis.  Persistent diffuse sclerotic lesions of the axial and appendicular skeleton.  This was done without contrast She also is on Kenalog for psoriasis, day and night difference. She is still able to do all her ADL's. She is eating well.  She continues to have mild constipation, manages it with stool softeners. Rest of the pertinent 10 point ROS reviewed and neg.   Patient Active Problem List   Diagnosis Date Noted   UGI bleed 05/27/2022   Acute prerenal azotemia 04/18/2022   Dehydration 04/18/2022   SIRS (systemic inflammatory response syndrome) (HCC)  04/18/2022   Allergic rhinitis 04/18/2022   Depression 04/18/2022   Anemia of chronic disease 04/18/2022   Bronchitis 04/18/2022   Acute bronchitis 04/17/2022   Intertrigo 01/10/2022   Bacterial vaginitis 01/10/2022   Sepsis due to cellulitis (HCC) 12/06/2021   B12 deficiency anemia 08/15/2020   Sensorineural hearing loss (SNHL) of both ears 08/07/2019   Intractable back pain 02/03/2019   Thoracic compression fracture, closed, initial encounter (HCC) 02/03/2019   Essential hypertension 02/03/2019   Genetic testing 01/27/2019   Acute pharyngitis    Bacteremia    FTT (failure to thrive) in adult    Fracture closed, humerus, shaft 01/09/2019   Pneumonia 12/25/2018   Acute respiratory disorder in immunocompromised patient (HCC) 12/25/2018   Family history of breast cancer    Family history of prostate cancer    Family history of melanoma    Family history of colon cancer    Pressure injury of skin 11/24/2018   Chronic respiratory failure with hypoxia and hypercapnia (HCC) 11/19/2018   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   Hypophosphatemia 11/19/2018   Hypocalcemia 11/19/2018   Aspiration pneumonia (HCC) 11/19/2018   SOB (shortness of breath)    Palliative care by specialist    Carcinoma of breast, estrogen receptor positive, stage 4, right (HCC) 11/16/2018   Hypoalbuminemia 11/16/2018   Pleural effusion 11/14/2018   Goals of care, counseling/discussion 11/06/2018   Malignant neoplasm of overlapping sites of right breast in female, estrogen receptor positive (HCC) 10/31/2018   Lung metastasis 10/31/2018   Pain from bone metastases (HCC) 10/31/2018   Class 2 obesity 10/31/2018   Bone injury    Pleural effusion, bilateral    Shortness of breath    Abnormal breast finding    Hypokalemia    Breast skin changes    Macrocytic anemia    Malignant neoplasm metastatic to bone (HCC)    Anemia, B12 deficiency 10/26/2018    is allergic to ferumoxytol.  MEDICAL HISTORY: Past  Medical History:  Diagnosis Date   Cancer [facility])    Family history of breast cancer    Family history of colon cancer    Family history of melanoma    Family history of prostate cancer    Gallstones    Glaucoma    Mastitis    reports history of recurrent mastitis   Metastatic breast cancer    Obesity     SURGICAL HISTORY: Past Surgical History:  Procedure Laterality Date   ESOPHAGOGASTRODUODENOSCOPY (EGD) WITH PROPOFOL N/A 05/27/2022   Procedure: ESOPHAGOGASTRODUODENOSCOPY (EGD) WITH PROPOFOL;  Surgeon: Willis Modena, MD;  Location: [facility] ENDOSCOPY;  Service: Gastroenterology;  Laterality: N/A;   GLAUCOMA SURGERY  late 1990's   IR THORACENTESIS ASP PLEURAL SPACE W/IMG GUIDE  10/28/2018   IR THORACENTESIS ASP PLEURAL SPACE W/IMG GUIDE  10/29/2018   OPEN REDUCTION INTERNAL FIXATION (ORIF) DISTAL RADIAL FRACTURE Left 01/09/2019   Procedure: OPEN REDUCTION INTERNAL FIXATION (ORIF) HUMERAL FRACTURE;  Surgeon: Beverely Low, MD;  Location: [facility] ORS;  Service: Orthopedics;  Laterality: Left;    SOCIAL HISTORY: Social History   Socioeconomic History   Marital status: Widowed    Spouse name: Not on file   Number of children: 1   Years of education: Not on file   Highest education level: Not on file  Occupational History   Occupation: Visual merchandiser Programs    Comment: Self-employed  Tobacco Use   Smoking status: Never   Smokeless tobacco: Never   Tobacco comments:    second hand smoke exposure  Vaping Use   Vaping Use: Never used  Substance and Sexual Activity   Alcohol use: No   Drug use: No   Sexual activity: Not Currently  Other Topics Concern   Not on file  Social History Narrative   Has a daughter named [name]. Daughter has CP.   Social Determinants of Health   Financial Resource Strain: Low Risk  (09/17/2022)   Overall Financial Resource Strain (CARDIA)    Difficulty of Paying Living Expenses: Not hard at all  Food Insecurity: No Food Insecurity (09/17/2022)    Hunger Vital Sign    Worried About Running Out of Food in the Last Year: Never true    Ran Out of Food in the Last Year: Never true  Transportation Needs: No Transportation Needs (09/17/2022)   PRAPARE - Administrator, Civil Service (Medical): No    Lack of Transportation (Non-Medical): No  Physical Activity: Not on file  Stress: Stress Concern Present (11/08/2021)   [employer] of Occupational Health - Occupational Stress Questionnaire    Feeling of Stress : To some extent  Social Connections: Socially Isolated (03/27/2021)   Social Connection and Isolation Panel [NHANES]    Frequency of Communication with Friends and Family: Once a week    Frequency of Social Gatherings with Friends and Family: More than three times a week    Attends Religious Services: Never    Database administrator or Organizations: No    Attends Banker Meetings: Never    Marital Status: Widowed  Intimate Partner Violence: Not At Risk (09/17/2022)   Humiliation, Afraid, Rape, and Kick questionnaire    Fear of Current or Ex-Partner: No    Emotionally Abused: No    Physically Abused: No    Sexually Abused: No    FAMILY HISTORY: Family History  Problem Relation Age of Onset   Breast cancer Sister        in her 105's-70s   Heart disease Brother        CABG   Heart disease Brother        CABG   Skin cancer Brother        melanoma   Colon cancer Cousin    Heart attack Mother    Heart attack Father    Prostate cancer Brother     Review of Systems  Constitutional:  Negative for appetite change, chills, fatigue, fever and unexpected weight change.  HENT:   Negative for hearing loss, lump/mass and trouble swallowing.   Eyes:  Negative for eye problems and icterus.  Respiratory:  Positive  for shortness of breath (at baseline). Negative for chest tightness and cough.   Cardiovascular:  Negative for chest pain, leg swelling and palpitations.  Gastrointestinal:  Negative for  abdominal distention, abdominal pain, constipation, diarrhea, nausea and vomiting.  Endocrine: Negative for hot flashes.  Genitourinary:  Negative for difficulty urinating.   Musculoskeletal:  Negative for arthralgias.  Skin:  Negative for itching and rash.  Neurological:  Negative for dizziness, extremity weakness, headaches and numbness.  Hematological:  Negative for adenopathy. Does not bruise/bleed easily.  Psychiatric/Behavioral:  Negative for depression. The patient is not nervous/anxious.       PHYSICAL EXAMINATION  ECOG PERFORMANCE STATUS: 2 - Symptomatic, <50% confined to bed  There were no vitals filed for this visit.   Physical Exam Constitutional:      General: She is not in acute distress.    Appearance: Normal appearance. She is not toxic-appearing.  HENT:     Head: Normocephalic and atraumatic.  Eyes:     General: No scleral icterus. Cardiovascular:     Rate and Rhythm: Normal rate and regular rhythm.     Pulses: Normal pulses.     Heart sounds: Normal heart sounds.  Pulmonary:     Effort: Pulmonary effort is normal.     Breath sounds: Normal breath sounds.  Abdominal:     General: Abdomen is flat. Bowel sounds are normal. There is no distension.     Palpations: Abdomen is soft.     Tenderness: There is no abdominal tenderness.  Musculoskeletal:        General: No swelling.     Cervical back: Neck supple.  Lymphadenopathy:     Cervical: No cervical adenopathy.  Skin:    General: Skin is warm and dry.     Findings: Rash (persistent underneath right breast--improved according to the patient and daughter) present.  Neurological:     General: No focal deficit present.     Mental Status: She is alert.  Psychiatric:        Mood and Affect: Mood normal.        Behavior: Behavior normal.     LABORATORY DATA:  CBC    Component Value Date/Time   WBC 8.5 09/13/2022 2330   RBC 4.11 09/13/2022 2330   HGB 10.5 (L) 09/13/2022 2330   HGB 9.7 (L) 08/28/2022  1252   HCT 35.7 (L) 09/13/2022 2330   PLT 311 09/13/2022 2330   PLT 296 08/28/2022 1252   MCV 86.9 09/13/2022 2330   MCH 25.5 (L) 09/13/2022 2330   MCHC 29.4 (L) 09/13/2022 2330   RDW 17.2 (H) 09/13/2022 2330   LYMPHSABS 0.9 08/28/2022 1252   MONOABS 0.3 08/28/2022 1252   EOSABS 0.2 08/28/2022 1252   BASOSABS 0.0 08/28/2022 1252    CMP     Component Value Date/Time   NA 137 09/13/2022 2330   K 3.8 09/13/2022 2330   CL 103 09/13/2022 2330   CO2 27 09/13/2022 2330   GLUCOSE 140 (H) 09/13/2022 2330   BUN 16 09/13/2022 2330   CREATININE 1.85 (H) 09/13/2022 2330   CREATININE 0.86 08/28/2022 1252   CALCIUM 8.7 (L) 09/13/2022 2330   PROT 7.7 09/13/2022 2330   ALBUMIN 3.4 (L) 09/13/2022 2330   AST 33 09/13/2022 2330   AST 38 08/28/2022 1252   ALT 20 09/13/2022 2330   ALT 18 08/28/2022 1252   ALKPHOS 114 09/13/2022 2330   BILITOT 0.7 09/13/2022 2330   BILITOT 0.5 08/28/2022 1252   GFRNONAA 30 (L)  09/13/2022 2330   GFRNONAA >60 08/28/2022 1252   GFRAA >60 02/24/2020 1454   GFRAA >60 07/24/2019 1453   ASSESSMENT and THERAPY PLAN:   Suzanne [name] is a [name] y.o. female who presents to the clinic for follow up for metastatic breast cancer to the bone.    #Metastatic breast cancer involving the bone:   S/P first line Palbociclib with AI. We started her on Faslodex while we were awaiting molecular testing. She was on Ibrance for almost 2 yrs. We have suspected that there could be some progression ongoing given her rising tumor markers and since she has been on this combination for almost 2+ years. Biopsy confirmed metastatic breast cancer ER, PR positive, Her 2 neg. Foundation one testing showed MSS, TMB 79muts/Mb, ESR1 mutation, AKT 1 mutation Other possible therapies include AKT inhibitor with fulvestrant for next line followed by single agent chemotherapy. She is now on orserdu, she had interruptions to treatment, took about a month, then had some hospitalization, treatment was  also interrupted by delay in obtaining medication. Hence we agreed to try it for longer. She has her next set of imaging in May. Most recent non contrast imaging with likely stable disease. Given the duration of response on Ibrance and AI, I wonder if she may have a good response to Orserdu as well.  She remains asymptomatic from her disease. Repeat imaging ordered for May. No concerns on ROS or PE for disease progression. Most recent imaging with no major concern for progression  With regards to the caliectasis and possible thrombus in the kidney, have had a conversation with the radiologist and he suggested to follow-up with urology if she has persistent hematuria. She is now following up with urology.  GERD, on protonix 40 mg BID, bland diet, use pepcid PRN. Please follow up with gastroenterology.  Psoriasis, derm appt on 4/8. Using kenalog for now, remarkably improved skin rash.  RTC in 4 weeks.  Total encounter time 30 minutes*in face-to-face visit time, chart review, lab review, care coordination, order entry, and documentation of the encounter time.  *Total Encounter Time as defined by the Centers for Medicare and Medicaid Services includes, in addition to the face-to-face time of a patient visit (documented in the note above) non-face-to-face time: obtaining and reviewing outside history, ordering and reviewing medications, tests or procedures, care coordination (communications with other health care professionals or caregivers) and documentation in the medical record.

## 2022-09-27 DIAGNOSIS — C50811 Malignant neoplasm of overlapping sites of right female breast: Secondary | ICD-10-CM | POA: Diagnosis not present

## 2022-09-27 LAB — CANCER ANTIGEN 27.29: CA 27.29: 1208.1 U/mL — ABNORMAL HIGH (ref 0.0–38.6)

## 2022-09-28 DIAGNOSIS — C50811 Malignant neoplasm of overlapping sites of right female breast: Secondary | ICD-10-CM | POA: Diagnosis not present

## 2022-10-01 DIAGNOSIS — C50811 Malignant neoplasm of overlapping sites of right female breast: Secondary | ICD-10-CM | POA: Diagnosis not present

## 2022-10-02 DIAGNOSIS — R8279 Other abnormal findings on microbiological examination of urine: Secondary | ICD-10-CM | POA: Diagnosis not present

## 2022-10-02 DIAGNOSIS — N201 Calculus of ureter: Secondary | ICD-10-CM | POA: Diagnosis not present

## 2022-10-02 DIAGNOSIS — C50811 Malignant neoplasm of overlapping sites of right female breast: Secondary | ICD-10-CM | POA: Diagnosis not present

## 2022-10-03 DIAGNOSIS — C50811 Malignant neoplasm of overlapping sites of right female breast: Secondary | ICD-10-CM | POA: Diagnosis not present

## 2022-10-04 ENCOUNTER — Ambulatory Visit (HOSPITAL_COMMUNITY)
Admission: RE | Admit: 2022-10-04 | Discharge: 2022-10-04 | Disposition: A | Discharge status: Home / Self Care | Payer: Medicaid Other | Source: Ambulatory Visit | Attending: Hematology and Oncology | Admitting: Hematology and Oncology

## 2022-10-04 DIAGNOSIS — R918 Other nonspecific abnormal finding of lung field: Secondary | ICD-10-CM | POA: Diagnosis not present

## 2022-10-04 DIAGNOSIS — C7981 Secondary malignant neoplasm of breast: Secondary | ICD-10-CM | POA: Diagnosis not present

## 2022-10-04 DIAGNOSIS — C7951 Secondary malignant neoplasm of bone: Secondary | ICD-10-CM | POA: Diagnosis present

## 2022-10-04 DIAGNOSIS — C50811 Malignant neoplasm of overlapping sites of right female breast: Secondary | ICD-10-CM | POA: Diagnosis not present

## 2022-10-04 DIAGNOSIS — C801 Malignant (primary) neoplasm, unspecified: Secondary | ICD-10-CM | POA: Diagnosis not present

## 2022-10-04 DIAGNOSIS — C787 Secondary malignant neoplasm of liver and intrahepatic bile duct: Secondary | ICD-10-CM | POA: Diagnosis not present

## 2022-10-04 MED ORDER — IOHEXOL 300 MG/ML  SOLN
100.0000 mL | Freq: Once | INTRAMUSCULAR | Status: AC | PRN
  Administered 2022-10-04: 100 mL via INTRAVENOUS

## 2022-10-05 ENCOUNTER — Telehealth: Payer: Self-pay | Admitting: *Deleted

## 2022-10-05 DIAGNOSIS — C50811 Malignant neoplasm of overlapping sites of right female breast: Secondary | ICD-10-CM | POA: Diagnosis not present

## 2022-10-05 NOTE — Telephone Encounter (Signed)
[name] called to this RN to informed her that the urologist has contacted them per the CT - noted kidney stone still present and he will be scheduling a lithotripsy procedure later this month.

## 2022-10-08 DIAGNOSIS — C50811 Malignant neoplasm of overlapping sites of right female breast: Secondary | ICD-10-CM | POA: Diagnosis not present

## 2022-10-09 ENCOUNTER — Other Ambulatory Visit: Payer: Self-pay | Admitting: Urology

## 2022-10-09 ENCOUNTER — Telehealth: Payer: Self-pay | Admitting: *Deleted

## 2022-10-09 NOTE — Progress Notes (Signed)
Patient was scheduled for surgery at [facility] on 10/17/2022. Patient is on long-term oxygen therapy which means she is not a candidate for surgery at [facility]. She also has a very complex medical history including Stage 4 metastatic breast cancer. I called and left a message with Shanda Bumps, scheduler at [facility] [phone]) and made her aware.

## 2022-10-10 ENCOUNTER — Encounter: Payer: Self-pay | Admitting: Dermatology

## 2022-10-10 DIAGNOSIS — C50811 Malignant neoplasm of overlapping sites of right female breast: Secondary | ICD-10-CM | POA: Diagnosis not present

## 2022-10-11 DIAGNOSIS — C50811 Malignant neoplasm of overlapping sites of right female breast: Secondary | ICD-10-CM | POA: Diagnosis not present

## 2022-10-12 ENCOUNTER — Telehealth: Payer: Self-pay | Admitting: Nurse Practitioner

## 2022-10-12 ENCOUNTER — Encounter: Payer: Self-pay | Admitting: Hematology and Oncology

## 2022-10-12 DIAGNOSIS — C50811 Malignant neoplasm of overlapping sites of right female breast: Secondary | ICD-10-CM | POA: Diagnosis not present

## 2022-10-12 NOTE — Telephone Encounter (Signed)
Heyyyy, I just looked through our faxes and I have nothing.

## 2022-10-12 NOTE — Telephone Encounter (Signed)
Shanda Bumps calling from [facility] is calling to report that she is in need of surgical clearance docs. Docs were faxed on 10/09/22/, 10/11/2022 Pts surgery is scheduled for 10/17/2022. Please advise CB- [phone]  x [phone]

## 2022-10-12 NOTE — Telephone Encounter (Signed)
No entry 

## 2022-10-12 NOTE — Telephone Encounter (Signed)
Copied from CRM [id number]. Topic: General - Other >> Oct 12, 2022  9:15 AM Macon Large wrote: Reason for CRM: Shanda Bumps with [facility] called to see if patient has been cleared for surgery on May 22.  Shanda Bumps stated that the surgical clearance form was faxed on 10/10/22 but they have not received anything back. Cb# [phone] Ext. [phone]

## 2022-10-12 NOTE — Telephone Encounter (Signed)
Responded in a different message.

## 2022-10-15 ENCOUNTER — Telehealth: Payer: Self-pay | Admitting: Nurse Practitioner

## 2022-10-15 ENCOUNTER — Telehealth: Payer: Self-pay | Admitting: Hematology and Oncology

## 2022-10-15 DIAGNOSIS — C50811 Malignant neoplasm of overlapping sites of right female breast: Secondary | ICD-10-CM | POA: Diagnosis not present

## 2022-10-15 NOTE — Telephone Encounter (Signed)
Copied from CRM [id number]. Topic: General - Other >> Oct 15, 2022  8:12 AM Clide Dales wrote: Clayborn Bigness with [facility] is faxing the surgical clearance form for the 3rd time this morning. If it is not received by lunch can someone please reach out to [place]. [phone] ext [phone]

## 2022-10-15 NOTE — Patient Instructions (Addendum)
SURGICAL WAITING ROOM VISITATION  Patients having surgery or a procedure may have no more than 2 support people in the waiting area - these visitors may rotate.    Children under the age of 71 must have an adult with them who is not the patient.  If the patient needs to stay at the hospital during part of their recovery, the visitor guidelines for inpatient rooms apply. Pre-op nurse will coordinate an appropriate time for 1 support person to accompany patient in pre-op.  This support person may not rotate.    Please refer to the [facility] website for the visitor guidelines for Inpatients (after your surgery is over and you are in a regular room).       Your procedure is scheduled on: 10-17-22    Report to [facility] Main Entrance    Report to admitting at       1130  AM   Call this number if you have problems the morning of surgery [phone]   Do not eat food :After Midnight.   After Midnight you may have the following liquids until _0745_____ AM DAY OF SURGERY   then nothing by mouth  Water Non-Citrus Juices (without pulp, NO RED-Apple, White grape, White cranberry) Black Coffee (NO MILK/CREAM OR CREAMERS, sugar ok)  Clear Tea (NO MILK/CREAM OR CREAMERS, sugar ok) regular and decaf                             Plain Jell-O (NO RED)                                           Fruit ices (not with fruit pulp, NO RED)                                     Popsicles (NO RED)                                                               Sports drinks like Gatorade (NO RED)                           If you have questions, please contact your surgeon's office.   FOLLOW ANY ADDITIONAL PRE OP INSTRUCTIONS YOU RECEIVED FROM YOUR SURGEON'S OFFICE!!!     Oral Hygiene is also important to reduce your risk of infection.                                    Remember - BRUSH YOUR TEETH THE MORNING OF SURGERY WITH YOUR REGULAR TOOTHPASTE  DENTURES WILL BE REMOVED PRIOR TO SURGERY  PLEASE DO NOT APPLY "Poly grip" OR ADHESIVES!!!   Do NOT smoke after Midnight   Take these medicines the morning of surgery with A SIP OF WATER: Tramadol or oxycodone if needed, pantoprazole, nebulizer if needed, zyrtec, orserdu  DO NOT TAKE ANY ORAL DIABETIC MEDICATIONS DAY OF YOUR SURGERY  Bring CPAP mask  and tubing day of surgery.                              You may not have any metal on your body including hair pins, jewelry, and body piercing             Do not wear make-up, lotions, powders, perfumes/cologne, or deodorant  Do not wear nail polish including gel and S&S, artificial/acrylic nails, or any other type of covering on natural nails including finger and toenails. If you have artificial nails, gel coating, etc. that needs to be removed by a nail salon please have this removed prior to surgery or surgery may need to be canceled/ delayed if the surgeon/ anesthesia feels like they are unable to be safely monitored.   Do not shave  48 hours prior to surgery.            Do not bring valuables to the hospital. [facility] IS NOT             RESPONSIBLE   FOR VALUABLES.   Contacts, glasses, dentures or bridgework may not be worn into surgery.   Bring small overnight bag day of surgery.   DO NOT BRING YOUR HOME MEDICATIONS TO THE HOSPITAL. PHARMACY WILL DISPENSE MEDICATIONS LISTED ON YOUR MEDICATION LIST TO YOU DURING YOUR ADMISSION IN THE HOSPITAL!    Patients discharged on the day of surgery will not be allowed to drive home.  Someone NEEDS to stay with you for the first 24 hours after anesthesia.   Special Instructions: Bring a copy of your healthcare power of attorney and living will documents the day of surgery if you haven't scanned them before.              Please read over the following fact sheets you were given: IF YOU HAVE QUESTIONS ABOUT YOUR PRE-OP INSTRUCTIONS PLEASE CALL [phone]   IIf you test positive for Covid or have been in contact with anyone that  has tested positive in the last 10 days please notify you surgeon.    [facility] - Preparing for Surgery Before surgery, you can play an important role.  Because skin is not sterile, your skin needs to be as free of germs as possible.  You can reduce the number of germs on your skin by washing with CHG (chlorahexidine gluconate) soap before surgery.  CHG is an antiseptic cleaner which kills germs and bonds with the skin to continue killing germs even after washing. Please DO NOT use if you have an allergy to CHG or antibacterial soaps.  If your skin becomes reddened/irritated stop using the CHG and inform your nurse when you arrive at Short Stay. Do not shave (including legs and underarms) for at least 48 hours prior to the first CHG shower.  You may shave your face/neck. Please follow these instructions carefully:  1.  Shower with CHG Soap the night before surgery and the  morning of Surgery.  2.  If you choose to wash your hair, wash your hair first as usual with your  normal  shampoo.  3.  After you shampoo, rinse your hair and body thoroughly to remove the  shampoo.                           4.  Use CHG as you would any other liquid soap.  You can apply chg directly  to the skin and wash                       Gently with a scrungie or clean washcloth.  5.  Apply the CHG Soap to your body ONLY FROM THE NECK DOWN.   Do not use on face/ open                           Wound or open sores. Avoid contact with eyes, ears mouth and genitals (private parts).                       Wash face,  Genitals (private parts) with your normal soap.             6.  Wash thoroughly, paying special attention to the area where your surgery  will be performed.  7.  Thoroughly rinse your body with warm water from the neck down.  8.  DO NOT shower/wash with your normal soap after using and rinsing off  the CHG Soap.                9.  Pat yourself dry with a clean towel.            10.  Wear clean pajamas.             11.  Place clean sheets on your bed the night of your first shower and do not  sleep with pets. Day of Surgery : Do not apply any lotions/deodorants the morning of surgery.  Please wear clean clothes to the hospital/surgery center.  FAILURE TO FOLLOW THESE INSTRUCTIONS MAY RESULT IN THE CANCELLATION OF YOUR SURGERY PATIENT SIGNATURE_________________________________  NURSE SIGNATURE__________________________________  ________________________________________________________________________

## 2022-10-15 NOTE — Telephone Encounter (Signed)
Spoke with patient confirming upcoming appointment  

## 2022-10-15 NOTE — Progress Notes (Signed)
PCP - Bertram Denver, NP Cardiologist - Laqueta Carina LOV 04-01-20 sees as need basis   PPM/ICD -  Device Orders -  Rep Notified -   Chest x-ray -  EKG - 05-27-22 epic Stress Test -  ECHO - 04-18-22 epic Cardiac Cath -  CBC/diff, CMP 09-26-22 epic  Sleep Study -  CPAP -   Fasting Blood Sugar -  Checks Blood Sugar _____ times a day  Blood Thinner Instructions: Aspirin Instructions:  ERAS Protcol - PRE-SURGERY Ensure or G2-    COVID vaccine -x4  Activity--Walks in home with o2 1.5L without SOB Anesthesia review: o2 1.5L continous due to scarring in lungs from breast cancer  prescribed by oncology, HgB 9.8  Patient denies shortness of breath, fever, cough and chest pain at PAT appointment   All instructions explained to the patient, with a verbal understanding of the material. Patient agrees to go over the instructions while at home for a better understanding. Patient also instructed to self quarantine after being tested for COVID-19. The opportunity to ask questions was provided.

## 2022-10-15 NOTE — Telephone Encounter (Signed)
Note faxed back 10/15/22

## 2022-10-16 ENCOUNTER — Encounter (HOSPITAL_COMMUNITY)
Admission: RE | Admit: 2022-10-16 | Discharge: 2022-10-16 | Disposition: A | Discharge status: Home / Self Care | Payer: Medicaid Other | Source: Ambulatory Visit | Attending: Urology | Admitting: Urology

## 2022-10-16 ENCOUNTER — Ambulatory Visit: Payer: Medicaid Other | Admitting: Dermatology

## 2022-10-16 ENCOUNTER — Inpatient Hospital Stay (HOSPITAL_BASED_OUTPATIENT_CLINIC_OR_DEPARTMENT_OTHER): Payer: Medicaid Other | Admitting: Hematology and Oncology

## 2022-10-16 ENCOUNTER — Encounter (HOSPITAL_COMMUNITY): Payer: Self-pay

## 2022-10-16 ENCOUNTER — Other Ambulatory Visit: Payer: Self-pay

## 2022-10-16 VITALS — BP 125/74 | HR 100 | Temp 99.1°F | Resp 18 | Ht 59.0 in | Wt 169.0 lb

## 2022-10-16 VITALS — BP 142/88 | HR 110 | Temp 98.1°F | Resp 18 | Ht 59.0 in | Wt 172.3 lb

## 2022-10-16 DIAGNOSIS — I10 Essential (primary) hypertension: Secondary | ICD-10-CM | POA: Insufficient documentation

## 2022-10-16 DIAGNOSIS — C50911 Malignant neoplasm of unspecified site of right female breast: Secondary | ICD-10-CM | POA: Diagnosis not present

## 2022-10-16 DIAGNOSIS — Z5111 Encounter for antineoplastic chemotherapy: Secondary | ICD-10-CM | POA: Diagnosis not present

## 2022-10-16 DIAGNOSIS — Z170 Estrogen receptor positive status [ER+]: Secondary | ICD-10-CM | POA: Diagnosis not present

## 2022-10-16 DIAGNOSIS — Z01812 Encounter for preprocedural laboratory examination: Secondary | ICD-10-CM | POA: Diagnosis present

## 2022-10-16 HISTORY — DX: Personal history of urinary calculi: Z87.442

## 2022-10-16 HISTORY — DX: Anemia, unspecified: D64.9

## 2022-10-16 HISTORY — DX: Personal history of other diseases of the digestive system: Z87.19

## 2022-10-16 HISTORY — DX: Psoriasis, unspecified: L40.9

## 2022-10-16 LAB — CBC
HCT: 33.4 % — ABNORMAL LOW (ref 36.0–46.0)
Hemoglobin: 9.8 g/dL — ABNORMAL LOW (ref 12.0–15.0)
MCH: 25.7 pg — ABNORMAL LOW (ref 26.0–34.0)
MCHC: 29.3 g/dL — ABNORMAL LOW (ref 30.0–36.0)
MCV: 87.4 fL (ref 80.0–100.0)
Platelets: 268 10*3/uL (ref 150–400)
RBC: 3.82 MIL/uL — ABNORMAL LOW (ref 3.87–5.11)
RDW: 18 % — ABNORMAL HIGH (ref 11.5–15.5)
WBC: 5.1 10*3/uL (ref 4.0–10.5)
nRBC: 0 % (ref 0.0–0.2)

## 2022-10-16 NOTE — Progress Notes (Signed)
[facility] Cancer Follow up:   Suzanne Rigg, NP [address] [place] [address]  DIAGNOSIS: Metastatic breast cancer  SUMMARY OF ONCOLOGIC HISTORY:  [name] presenting 10/26/2018 with right-sided inflammatory breast cancer, stage IV, involving lungs, lymph nodes and bones, as follows:             (a) chest CT scan 10/26/2018 shows bilateral pleural effusions, possible lymphangitic spread of tumor, diffuse bony metastatic disease, and significant axillary mediastinal and hilar adenopathy             (b) bone scan 10/27/2018 is a "near SuperScan" consistent with widespread bony metastatic disease             (c) head CT with and without contrast 10/30/2018 shows no intracranial metastatic disease, multiple calvarial lesions             (d) CA-27-29 on 10/27/2018 was 1810.4   (1) pleural fluid from right thoracentesis 10/28/2018 confirms malignant cells consistent with a breast primary, strongly estrogen and progesterone receptor positive, HER-2 not amplified, with an MIB-1 of 2%   (2) anastrozole started 10/29/2018             (a) palbociclib started 11/13/2018 at 100 mg dose             (b) denosumab/xgeva started 11/13/2018              (C) palbociclib dose decreased to 75mg              (D) Palbociclib dose decreased to 75 mg every other day in 11/2021   (3) associated problems:             (a) hypoxia secondary to effusions             (b) pain from bone lesions and left humeral and vertebral compression fractures             (c) right upper extremity lymphedema             (d) poor venous access   (4) genetics testing on 12/25/2018 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 STK11, TP53, TSC1, TSC2, and VHL.  The following genes were evaluated for sequence changes only: SDHA and HOXB13 c.251G>A variant only. The [facility] Melanoma Panel analyzed the following 9 genes: BAP1 BRCA2 CDK4 CDKN2A MITF POT1 PTEN RB1 Tp53.    (5) palliative radiation to the left humerus 07/20/2019 - 07/31/2019 Site Technique Total Dose (Gy) Dose per Fx (Gy) Completed Fx Beam Energies  Humerus, Left: Ext_Lt_humerus Complex 30/30 3 10/10 6X    (6) anemia requiring transfusion:              (a) B-12 level 134 FEB 2022: B-12 injectons started              (B) iron deficient, IV iron to start 2/6  (C) Hospitalized with upper GI bleed 05/27/2022-05/30/2022  (7) Elacestrant started 04/30/2022  INTERVAL HISTORY:  Suzanne Kindle T  [name] 62 y.o. female returns for follow-up.   Since her last visit here, she has possible surgery for kidney stone planned tomorrow.  She is otherwise feeling well, skin rash has been significantly better. She is still able to do all her ADL's. She is eating well.  She continues to have mild constipation, manages it with stool softeners. Rest of the pertinent 10 point ROS reviewed and neg.   Patient Active Problem List   Diagnosis Date Noted   UGI bleed 05/27/2022   Acute prerenal azotemia 04/18/2022   Dehydration 04/18/2022   SIRS (systemic inflammatory response syndrome) (HCC) 04/18/2022   Allergic rhinitis 04/18/2022   Depression 04/18/2022   Anemia of chronic disease 04/18/2022   Bronchitis 04/18/2022   Acute bronchitis 04/17/2022   Intertrigo 01/10/2022   Bacterial vaginitis 01/10/2022   Sepsis due to cellulitis (HCC) 12/06/2021   B12 deficiency anemia 08/15/2020   Sensorineural hearing loss  (SNHL) of both ears 08/07/2019   Intractable back pain 02/03/2019   Thoracic compression fracture, closed, initial encounter (HCC) 02/03/2019   Essential hypertension 02/03/2019   Genetic testing 01/27/2019   Acute pharyngitis    Bacteremia    FTT (failure to thrive) in adult    Fracture closed, humerus, shaft 01/09/2019   Pneumonia 12/25/2018   Acute respiratory disorder in immunocompromised patient (HCC) 12/25/2018   Family history of breast cancer    Family history of prostate cancer    Family history of melanoma    Family history of colon cancer    Pressure injury of skin 11/24/2018   Chronic respiratory failure with hypoxia and hypercapnia (HCC) 11/19/2018   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   Hypophosphatemia 11/19/2018   Hypocalcemia 11/19/2018   Aspiration pneumonia (HCC) 11/19/2018   SOB (shortness of breath)    Palliative care by specialist    Carcinoma of breast, estrogen receptor positive, stage 4, right (HCC) 11/16/2018   Hypoalbuminemia 11/16/2018   Pleural effusion 11/14/2018   Goals of care, counseling/discussion 11/06/2018   Malignant neoplasm of overlapping sites of right breast in female, estrogen receptor positive (HCC) 10/31/2018   Lung metastasis 10/31/2018   Pain from bone metastases (HCC) 10/31/2018   Class 2 obesity 10/31/2018   Bone injury    Pleural effusion, bilateral    Shortness of breath    Abnormal breast finding    Hypokalemia    Breast skin changes    Macrocytic anemia    Malignant neoplasm metastatic to bone (HCC)    Anemia, B12 deficiency 10/26/2018    is allergic to ferumoxytol.  MEDICAL HISTORY: Past Medical History:  Diagnosis Date   Anemia    Cancer (HCC)    Family history of breast cancer    Family history of colon cancer    Family history of melanoma    Family history of prostate cancer    Gallstones    History of hiatal hernia    History of kidney stones    Mastitis    reports history of recurrent mastitis    Metastatic breast cancer    Obesity    Psoriasis     SURGICAL HISTORY: Past Surgical History:  Procedure Laterality Date   ESOPHAGOGASTRODUODENOSCOPY (EGD) WITH PROPOFOL N/A 05/27/2022   Procedure: ESOPHAGOGASTRODUODENOSCOPY (EGD) WITH PROPOFOL;  Surgeon: Willis Modena, MD;  Location: [facility];  Service: Gastroenterology;  Laterality: N/A;   EYE SURGERY     cataract   GLAUCOMA SURGERY  late 1990's   IR THORACENTESIS ASP PLEURAL SPACE W/IMG GUIDE  10/28/2018  IR THORACENTESIS ASP PLEURAL SPACE W/IMG GUIDE  10/29/2018   OPEN REDUCTION INTERNAL FIXATION (ORIF) DISTAL RADIAL FRACTURE Left 01/09/2019   Procedure: OPEN REDUCTION INTERNAL FIXATION (ORIF) HUMERAL FRACTURE;  Surgeon: Beverely Low, MD;  Location: [facility];  Service: Orthopedics;  Laterality: Left;    SOCIAL HISTORY: Social History   Socioeconomic History   Marital status: Widowed    Spouse name: Not on file   Number of children: 1   Years of education: Not on file   Highest education level: Not on file  Occupational History   Occupation: Visual merchandiser Programs    Comment: Self-employed  Tobacco Use   Smoking status: Never   Smokeless tobacco: Never   Tobacco comments:    second hand smoke exposure  Vaping Use   Vaping Use: Never used  Substance and Sexual Activity   Alcohol use: No   Drug use: No   Sexual activity: Not Currently  Other Topics Concern   Not on file  Social History Narrative   Has a daughter named [name]. Daughter has CP.   Social Determinants of Health   Financial Resource Strain: Low Risk  (09/17/2022)   Overall Financial Resource Strain (CARDIA)    Difficulty of Paying Living Expenses: Not hard at all  Food Insecurity: No Food Insecurity (09/17/2022)   Hunger Vital Sign    Worried About Running Out of Food in the Last Year: Never true    Ran Out of Food in the Last Year: Never true  Transportation Needs: No Transportation Needs (09/17/2022)   PRAPARE - Doctor, general practice (Medical): No    Lack of Transportation (Non-Medical): No  Physical Activity: Not on file  Stress: Stress Concern Present (11/08/2021)   [employer] of Occupational Health - Occupational Stress Questionnaire    Feeling of Stress : To some extent  Social Connections: Socially Isolated (03/27/2021)   Social Connection and Isolation Panel [NHANES]    Frequency of Communication with Friends and Family: Once a week    Frequency of Social Gatherings with Friends and Family: More than three times a week    Attends Religious Services: Never    Database administrator or Organizations: No    Attends Banker Meetings: Never    Marital Status: Widowed  Intimate Partner Violence: Not At Risk (09/17/2022)   Humiliation, Afraid, Rape, and Kick questionnaire    Fear of Current or Ex-Partner: No    Emotionally Abused: No    Physically Abused: No    Sexually Abused: No    FAMILY HISTORY: Family History  Problem Relation Age of Onset   Breast cancer Sister        in her 59's-70s   Heart disease Brother        CABG   Heart disease Brother        CABG   Skin cancer Brother        melanoma   Colon cancer Cousin    Heart attack Mother    Heart attack Father    Prostate cancer Brother     Review of Systems  Constitutional:  Negative for appetite change, chills, fatigue, fever and unexpected weight change.  HENT:   Negative for hearing loss, lump/mass and trouble swallowing.   Eyes:  Negative for eye problems and icterus.  Respiratory:  Positive for shortness of breath (at baseline). Negative for chest tightness and cough.   Cardiovascular:  Negative for chest pain, leg swelling and palpitations.  Gastrointestinal:  Negative for abdominal distention, abdominal pain, constipation, diarrhea, nausea and vomiting.  Endocrine: Negative for hot flashes.  Genitourinary:  Negative for difficulty urinating.   Musculoskeletal:  Negative for arthralgias.  Skin:   Negative for itching and rash.  Neurological:  Negative for dizziness, extremity weakness, headaches and numbness.  Hematological:  Negative for adenopathy. Does not bruise/bleed easily.  Psychiatric/Behavioral:  Negative for depression. The patient is not nervous/anxious.       PHYSICAL EXAMINATION  ECOG PERFORMANCE STATUS: 2 - Symptomatic, <50% confined to bed  Vitals:   10/16/22 1450  BP: (!) 142/88  Pulse: (!) 110  Resp: 18  Temp: 98.1 F (36.7 C)  SpO2: 99%     She appears well today, in no acute distress Skin rash is significantly better.  Rest of the physical exam deferred in lieu of counseling  LABORATORY DATA:  CBC    Component Value Date/Time   WBC 5.1 10/16/2022 1301   RBC 3.82 (L) 10/16/2022 1301   HGB 9.8 (L) 10/16/2022 1301   HGB 8.9 (L) 09/26/2022 1409   HCT 33.4 (L) 10/16/2022 1301   PLT 268 10/16/2022 1301   PLT 290 09/26/2022 1409   MCV 87.4 10/16/2022 1301   MCH 25.7 (L) 10/16/2022 1301   MCHC 29.3 (L) 10/16/2022 1301   RDW 18.0 (H) 10/16/2022 1301   LYMPHSABS 0.9 09/26/2022 1409   MONOABS 0.3 09/26/2022 1409   EOSABS 0.2 09/26/2022 1409   BASOSABS 0.0 09/26/2022 1409    CMP     Component Value Date/Time   NA 141 09/26/2022 1409   K 3.9 09/26/2022 1409   CL 105 09/26/2022 1409   CO2 31 09/26/2022 1409   GLUCOSE 90 09/26/2022 1409   BUN 16 09/26/2022 1409   CREATININE 0.91 09/26/2022 1409   CALCIUM 8.9 09/26/2022 1409   PROT 6.5 09/26/2022 1409   ALBUMIN 3.4 (L) 09/26/2022 1409   AST 24 09/26/2022 1409   ALT 13 09/26/2022 1409   ALKPHOS 106 09/26/2022 1409   BILITOT 0.3 09/26/2022 1409   GFRNONAA >60 09/26/2022 1409   GFRAA >60 02/24/2020 1454   GFRAA >60 07/24/2019 1453   ASSESSMENT and THERAPY PLAN:   Suzanne [name] is a 62 y.o. female who presents to the clinic for follow up for metastatic breast cancer to the bone.    #Metastatic breast cancer involving the bone:   S/P first line Palbociclib with AI. We started her on  Faslodex while we were awaiting molecular testing. She was on Ibrance for almost 2 yrs. We have suspected that there could be some progression ongoing given her rising tumor markers and since she has been on this combination for almost 2+ years. Biopsy confirmed metastatic breast cancer ER, PR positive, Her 2 neg. 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 She is now on orserdu, she had interruptions to treatment, took about a month, then had some hospitalization, treatment was also interrupted by delay in obtaining medication. Hence we agreed to try it for longer. She has her next set of imaging in May. Most recent non contrast imaging with likely stable disease. We started her on on surgery due in February 2024.  She appears to have progression on the most recent imaging.  Given her Katina Dung mutation, I have today discussed about capivasertib with Faslodex versus Xeloda. I have discussed today the mechanism of  action, adverse effects including but not limited to fatigue, diarrhea, skin rash, hyperglycemia, lab abnormalities etc.  She is willing to proceed with this treatment.  With regards to the procedure scheduled for tomorrow, her hemoglobin today is 9.8 g/dL.  Her baseline hemoglobin ranges from 8 to 9 g/dL.  If neurology is significantly concerned about the procedure and her anemia, they may have to consider blood transfusion.  Psoriasis. Using kenalog for now, remarkably improved skin rash.  RTC in 4 weeks.  Total encounter time 30 minutes*in face-to-face visit time, chart review, lab review, care coordination, order entry, and documentation of the encounter time.  *Total Encounter Time as defined by the Centers for Medicare and Medicaid Services includes, in addition to the face-to-face time of a patient visit (documented in the note above)  non-face-to-face time: obtaining and reviewing outside history, ordering and reviewing medications, tests or procedures, care coordination (communications with other health care professionals or caregivers) and documentation in the medical record.

## 2022-10-16 NOTE — Progress Notes (Signed)
DISCONTINUE ON PATHWAY REGIMEN - Breast     A cycle is every 28 days:     Elacestrant   **Always confirm dose/schedule in your pharmacy ordering system**  REASON: Disease Progression PRIOR TREATMENT: [id number]: Elacestrant 345 mg Daily TREATMENT RESPONSE: Progressive Disease (PD)  START ON PATHWAY REGIMEN - Breast     Cycle 1: A cycle is 28 days:     Capivasertib      Fulvestrant    Cycles 2 and beyond: A cycle is every 28 days:     Capivasertib      Fulvestrant   **Always confirm dose/schedule in your pharmacy ordering system**  Patient Characteristics: Distant Metastases or Locoregional Recurrent Disease - Unresected, M0 or Locally Advanced Unresectable Disease Progressing after Neoadjuvant and Local Therapies, M0, HER2 Low/Negative, ER Positive, Endocrine Therapy, Postmenopausal, Third Line and  Beyond, AKT1/PTEN-Altered Therapeutic Status: Distant Metastases HER2 Status: Negative (-) ER Status: Positive (+) PR Status: Positive (+) Therapy Approach Indicated: Standard Chemotherapy/Endocrine Therapy Menopausal Status: Postmenopausal Line of Therapy: Third Line and Beyond Intent of Therapy: Non-Curative / Palliative Intent, Discussed with Patient

## 2022-10-16 NOTE — H&P (Signed)
Office Visit Report     10/02/2022   --------------------------------------------------------------------------------   Suzanne [name]  MRN: [id number]  DOB: 1960/06/07, 62 year old Female  SSN:    PRIMARY CARE:  Bertram Denver, NP  PRIMARY CARE FAX:  [phone]  REFERRING:    PROVIDER:  Rhoderick Moody, M.D.  TREATING:  Anne Fu, NP  LOCATION:  [facility] [phone]     --------------------------------------------------------------------------------   CC: I have pain in the flank.  HPI: Suzanne [name] is a 62 year-old female established patient who is here for flank pain.  - The patient was seen in the emergency department on 09/13/2022 and diagnosed with a 4 mm left UVJ stone. Serum creatinine was found to be elevated at 1.85 (baseline is 1.0).  -History of widely metastatic breast cancer  -Currently, the patient states that her pain is significantly improved though she continues to have episodes of urinary urgency and frequency. She denies any recent episodes of dysuria, hematuria, flank pain or fever/chills.   10/02/2022: Pt f/u today for repeat exam. KUB at last exam was inconclusive due to the presence of numerous pelvic phleboliths outside of previously noted distal left ureteral calculi. On ultrasound both ureteral jets were seen but there was some questionable continued pelvic fullness versus parapelvic cyst within the left kidney. She had f/u labs on 05/01 which showed improvement in creatinine (0.91). Today she denies any interval stone material passage but previously endorsed left-sided pain and discomfort has resolved. Also no longer having the exacerbations of frequency/urgency endorsed at time of last exam. She has had no interval dysuria or gross hematuria as well. She is scheduled for repeat CT imaging in 2 days time from her oncologist.   The problem is on the left side. Her pain started about 09/13/2022.   She has not had this same pain  previously. She has not had kidney stones.     ALLERGIES: Iron Infusion - rash    MEDICATIONS: Tamsulosin Hcl 0.4 mg capsule  Zyrtec  Acetaminophen 500 mg tablet  Calcium Carbonate  Cephalexin 500 mg capsule  Famotidine 20 mg tablet  Folic Acid 1 mg tablet  Furosemide 20 mg tablet  Imodium A-D  Ipratropium-Albuterol 0.5 mg-3 mg (2.5 mg base)/3 ml ampul for nebulization  Kenolog  Melatonin 1 mg tablet, extended release  Ondansetron Hcl 4 mg tablet  Orserdu 345 mg tablet  Oxycodone-Acetaminophen  Pantoprazole Sodium 40 mg tablet, delayed release  Protonix 40 mg tablet, delayed release  Protopic  Tramadol Hcl 50 mg tablet  Xgeva     GU PSH: No GU PSH      PSH Notes: arm surgery   NON-GU PSH: Cataract surgery     GU PMH: Flank Pain - 09/18/2022 Ureteral calculus - 09/18/2022      PMH Notes: Cardiovascular and Mediastinum  Essential hypertension  gallstones, psoriasis, hernia, ulcers  Respiratory  Pleural effusion, bilateral  Lung metastasis  Pleural effusion  Chronic respiratory failure with hypoxia and hypercapnia  Malignant pleural effusion  Aspiration pneumonia  Pneumonia  Acute respiratory disorder in immunocompromised patient  Acute pharyngitis  Acute bronchitis  Allergic rhinitis  Bronchitis   Digestive  UGI bleed   Nervous and Auditory  Sensorineural hearing loss (SNHL) of both ears   Musculoskeletal and Integument  Malignant neoplasm metastatic to bone  Bone injury  Pain from bone metastases  Fracture closed, humerus, shaft  Thoracic compression fracture, closed, initial encounter  Intertrigo   Genitourinary  Bacterial vaginitis   Other  Anemia, B12 deficiency  Shortness of breath  Abnormal breast finding  Hypokalemia  Breast skin changes  Macrocytic anemia  Malignant neoplasm of overlapping sites of right breast in female, estrogen receptor positive  Class 2 obesity  Goals of care, counseling/discussion  Carcinoma of breast,  estrogen receptor positive, stage 4, right  Hypoalbuminemia  Palliative care by specialist  Hypophosphatemia  Hypocalcemia  SOB (shortness of breath)  Pressure injury of skin  Family history of breast cancer  Family history of prostate cancer  Family history of melanoma  Family history of colon cancer  Bacteremia  FTT (failure to thrive) in adult  Genetic testing  Intractable back pain  B12 deficiency anemia  Sepsis due to cellulitis  Acute prerenal azotemia  Dehydration  SIRS (systemic inflammatory response syndrome)  Depression  Anemia of chronic disease      NON-GU PMH: No Non-GU PMH    FAMILY HISTORY: 1 Daughter - Daughter   SOCIAL HISTORY: Marital Status: Widowed Preferred Language: English; Ethnicity: Not Hispanic Or Latino; Race: White Current Smoking Status: Patient has never smoked.   Tobacco Use Assessment Completed: Used Tobacco in last 30 days? Has never drank.  Does not drink caffeine.    REVIEW OF SYSTEMS:    GU Review Female:   Patient denies frequent urination, hard to postpone urination, burning /pain with urination, get up at night to urinate, leakage of urine, stream starts and stops, trouble starting your stream, have to strain to urinate, and being pregnant.  Gastrointestinal (Upper):   Patient denies nausea, vomiting, and indigestion/ heartburn.  Gastrointestinal (Lower):   Patient denies diarrhea and constipation.  Constitutional:   Patient denies fever, night sweats, weight loss, and fatigue.  Skin:   Patient denies skin rash/ lesion and itching.  Eyes:   Patient denies blurred vision and double vision.  Ears/ Nose/ Throat:   Patient denies sore throat and sinus problems.  Hematologic/Lymphatic:   Patient denies swollen glands and easy bruising.  Cardiovascular:   Patient denies leg swelling and chest pains.  Respiratory:   Patient denies cough and shortness of breath.  Endocrine:   Patient denies excessive thirst.  Musculoskeletal:   Patient  denies back pain and joint pain.  Neurological:   Patient denies headaches and dizziness.  Psychologic:   Patient denies depression and anxiety.   VITAL SIGNS:      10/02/2022 03:49 PM  Weight 175 lb / 79.38 kg  Height 59 in / 149.86 cm  BP 115/70 mmHg  Pulse 111 /min  BMI 35.3 kg/m   MULTI-SYSTEM PHYSICAL EXAMINATION:    Constitutional: Well-nourished. No physical deformities. Normally developed. Good grooming.  Neck: Neck symmetrical, not swollen. Normal tracheal position.  Respiratory: No labored breathing, no use of accessory muscles. On Continuous [place] 02  Neurologic / Psychiatric: Oriented to time, oriented to place, oriented to person. No depression, no anxiety, no agitation.  Musculoskeletal: Normal gait and station of head and neck.     Complexity of Data:  Source Of History:  Patient, Medical Record Summary  Lab Test Review:   BMP, CMP  Records Review:   Previous Doctor Records, Previous Hospital Records, Previous Patient Records  Urine Test Review:   Urinalysis  X-Ray Review: KUB: Reviewed Films.  Renal Ultrasound (Limited): Reviewed Films.  C.T. Abdomen/Pelvis: Reviewed Films. Reviewed Report.     09/18/22  General Chemistry  Sodium 138 mEq/L  Potassium 4.4 mEq/L  BUN 15 mg/dL  Creatinine 1.0 mg/dL  Chloride 295 mEq/L  CO2 23 mEq/L  Glucose 123 mg/dL  Calcium 8.2 mg/dL  eGFR African American 69.4   eGFR Non-Afr. American 59.9    PROCEDURES:          Urinalysis w/Scope Dipstick Dipstick Cont'd Micro  Color: Yellow Bilirubin: Neg mg/dL WBC/hpf: 10 - 11/BJY  Appearance: Clear Ketones: Neg mg/dL RBC/hpf: NS (Not Seen)  Specific Gravity: 1.025 Blood: Neg ery/uL Bacteria: Mod (26-50/hpf)  pH: 6.0 Protein: 1+ mg/dL Cystals: NS (Not Seen)  Glucose: Neg mg/dL Urobilinogen: 1.0 mg/dL Casts: NS (Not Seen)    Nitrites: Neg Trichomonas: Not Present    Leukocyte Esterase: Trace leu/uL Mucous: Present      Epithelial Cells: 6 - 10/hpf      Yeast: NS (Not Seen)       Sperm: Not Present    ASSESSMENT:      ICD-10 Details  1 GU:   Ureteral calculus - N20.1 Left, Acute, Improving  3   Flank Pain - R10.84 Left, Acute, Complicated Injury, Resolved  2 NON-GU:   Pyuria/other UA findings - R82.79 Undiagnosed New Problem   PLAN:           Orders Labs Urine Culture          Schedule Return Visit/Planned Activity: Return PRN - Office Visit          Document Letter(s):  Created for Patient: Clinical Summary         Notes:   Continues to be asymptomatic. No microscopic hematuria on today's UA but there is some pyuria and bacteria in the setting of epithelial cells. Precautionary urine culture sent and if indicated once reviewed appropriate antimicrobial treatment will be prescribed. Strong chance she likely passed the stone. With pending CT imaging later this week, I did not feel compelled to repeat a KUB or renal ultrasound here in clinic today. I made a note to check that CT scan to ensure that the previously identified distal left ureteral calculi has indeed cleared. We discussed stone prevention strategies with emphasis on appropriate hydration and other dietary modification. Appropriate return to clinic instructions for any new or worsening symptomology suggestive of a stone event or other urological concerns. For now she may follow-up with [place] as needed.

## 2022-10-17 ENCOUNTER — Ambulatory Visit (HOSPITAL_BASED_OUTPATIENT_CLINIC_OR_DEPARTMENT_OTHER): Payer: Medicaid Other | Admitting: Anesthesiology

## 2022-10-17 ENCOUNTER — Ambulatory Visit (HOSPITAL_COMMUNITY): Payer: Medicaid Other | Admitting: Physician Assistant

## 2022-10-17 ENCOUNTER — Telehealth: Payer: Self-pay | Admitting: Hematology and Oncology

## 2022-10-17 ENCOUNTER — Encounter (HOSPITAL_COMMUNITY): Payer: Self-pay | Admitting: Urology

## 2022-10-17 ENCOUNTER — Ambulatory Visit (HOSPITAL_COMMUNITY)
Admission: RE | Admit: 2022-10-17 | Discharge: 2022-10-17 | Disposition: A | Discharge status: Home / Self Care | Payer: Medicaid Other | Attending: Urology | Admitting: Urology

## 2022-10-17 ENCOUNTER — Ambulatory Visit (HOSPITAL_COMMUNITY): Payer: Medicaid Other

## 2022-10-17 ENCOUNTER — Encounter (HOSPITAL_COMMUNITY)
Admission: RE | Disposition: A | Discharge status: Home / Self Care | Payer: Self-pay | Source: Home / Self Care | Attending: Urology

## 2022-10-17 DIAGNOSIS — N201 Calculus of ureter: Secondary | ICD-10-CM

## 2022-10-17 DIAGNOSIS — K219 Gastro-esophageal reflux disease without esophagitis: Secondary | ICD-10-CM | POA: Diagnosis not present

## 2022-10-17 DIAGNOSIS — I10 Essential (primary) hypertension: Secondary | ICD-10-CM

## 2022-10-17 DIAGNOSIS — Z9981 Dependence on supplemental oxygen: Secondary | ICD-10-CM | POA: Diagnosis not present

## 2022-10-17 DIAGNOSIS — C7951 Secondary malignant neoplasm of bone: Secondary | ICD-10-CM | POA: Diagnosis not present

## 2022-10-17 DIAGNOSIS — C50919 Malignant neoplasm of unspecified site of unspecified female breast: Secondary | ICD-10-CM | POA: Diagnosis not present

## 2022-10-17 DIAGNOSIS — C7800 Secondary malignant neoplasm of unspecified lung: Secondary | ICD-10-CM | POA: Insufficient documentation

## 2022-10-17 DIAGNOSIS — J449 Chronic obstructive pulmonary disease, unspecified: Secondary | ICD-10-CM | POA: Insufficient documentation

## 2022-10-17 DIAGNOSIS — D649 Anemia, unspecified: Secondary | ICD-10-CM

## 2022-10-17 HISTORY — PX: CYSTOSCOPY/URETEROSCOPY/HOLMIUM LASER/STENT PLACEMENT: SHX6546

## 2022-10-17 SURGERY — CYSTOSCOPY/URETEROSCOPY/HOLMIUM LASER/STENT PLACEMENT
Anesthesia: General | Laterality: Left

## 2022-10-17 MED ORDER — PROPOFOL 10 MG/ML IV BOLUS
INTRAVENOUS | Status: AC
  Filled 2022-10-17: qty 20

## 2022-10-17 MED ORDER — CEPHALEXIN 500 MG PO CAPS: 500.0000 mg | ORAL_CAPSULE | Freq: Two times a day (BID) | ORAL | 0 refills | Status: DC

## 2022-10-17 MED ORDER — DEXAMETHASONE SODIUM PHOSPHATE 10 MG/ML IJ SOLN
INTRAMUSCULAR | Status: AC
  Filled 2022-10-17: qty 1

## 2022-10-17 MED ORDER — LIDOCAINE HCL (PF) 2 % IJ SOLN
INTRAMUSCULAR | Status: AC
  Filled 2022-10-17: qty 5

## 2022-10-17 MED ORDER — LACTATED RINGERS IV SOLN: INTRAVENOUS | Status: DC

## 2022-10-17 MED ORDER — DEXAMETHASONE SODIUM PHOSPHATE 4 MG/ML IJ SOLN
INTRAMUSCULAR | Status: DC | PRN
  Administered 2022-10-17: 4 mg via INTRAVENOUS

## 2022-10-17 MED ORDER — FENTANYL CITRATE (PF) 100 MCG/2ML IJ SOLN
INTRAMUSCULAR | Status: DC | PRN
  Administered 2022-10-17 (×2): 50 ug via INTRAVENOUS

## 2022-10-17 MED ORDER — FENTANYL CITRATE (PF) 100 MCG/2ML IJ SOLN
INTRAMUSCULAR | Status: AC
  Filled 2022-10-17: qty 2

## 2022-10-17 MED ORDER — LIDOCAINE 2% (20 MG/ML) 5 ML SYRINGE
INTRAMUSCULAR | Status: DC | PRN
  Administered 2022-10-17: 20 mg via INTRAVENOUS

## 2022-10-17 MED ORDER — CEFAZOLIN SODIUM-DEXTROSE 2-4 GM/100ML-% IV SOLN
2.0000 g | INTRAVENOUS | Status: AC
  Administered 2022-10-17: 2 g via INTRAVENOUS
  Filled 2022-10-17: qty 100

## 2022-10-17 MED ORDER — PHENYLEPHRINE 80 MCG/ML (10ML) SYRINGE FOR IV PUSH (FOR BLOOD PRESSURE SUPPORT)
PREFILLED_SYRINGE | INTRAVENOUS | Status: AC
  Filled 2022-10-17: qty 10

## 2022-10-17 MED ORDER — PHENYLEPHRINE 80 MCG/ML (10ML) SYRINGE FOR IV PUSH (FOR BLOOD PRESSURE SUPPORT)
PREFILLED_SYRINGE | INTRAVENOUS | Status: DC | PRN
  Administered 2022-10-17: 160 ug via INTRAVENOUS

## 2022-10-17 MED ORDER — PROPOFOL 10 MG/ML IV BOLUS
INTRAVENOUS | Status: DC | PRN
  Administered 2022-10-17: 30 mg via INTRAVENOUS
  Administered 2022-10-17: 150 mg via INTRAVENOUS

## 2022-10-17 MED ORDER — OXYBUTYNIN CHLORIDE 5 MG PO TABS: 5.0000 mg | ORAL_TABLET | Freq: Three times a day (TID) | ORAL | 1 refills | Status: DC | PRN

## 2022-10-17 MED ORDER — SCOPOLAMINE 1 MG/3DAYS TD PT72
MEDICATED_PATCH | TRANSDERMAL | Status: AC
  Filled 2022-10-17: qty 1

## 2022-10-17 MED ORDER — ONDANSETRON HCL 4 MG/2ML IJ SOLN
INTRAMUSCULAR | Status: DC | PRN
  Administered 2022-10-17: 4 mg via INTRAVENOUS

## 2022-10-17 MED ORDER — ONDANSETRON HCL 4 MG/2ML IJ SOLN
INTRAMUSCULAR | Status: AC
  Filled 2022-10-17: qty 2

## 2022-10-17 MED ORDER — SODIUM CHLORIDE 0.9 % IR SOLN
Status: DC | PRN
  Administered 2022-10-17: 3000 mL

## 2022-10-17 MED ORDER — 0.9 % SODIUM CHLORIDE (POUR BTL) OPTIME
TOPICAL | Status: DC | PRN
  Administered 2022-10-17: 1000 mL

## 2022-10-17 MED ORDER — PHENAZOPYRIDINE HCL 200 MG PO TABS: 200.0000 mg | ORAL_TABLET | Freq: Three times a day (TID) | ORAL | 0 refills | Status: DC | PRN

## 2022-10-17 MED ORDER — IOHEXOL 300 MG/ML  SOLN
INTRAMUSCULAR | Status: DC | PRN
  Administered 2022-10-17: 10 mL

## 2022-10-17 MED ORDER — ORAL CARE MOUTH RINSE: 15.0000 mL | Freq: Once | OROMUCOSAL | Status: AC

## 2022-10-17 MED ORDER — CHLORHEXIDINE GLUCONATE 0.12 % MT SOLN
15.0000 mL | Freq: Once | OROMUCOSAL | Status: AC
  Administered 2022-10-17: 15 mL via OROMUCOSAL

## 2022-10-17 MED ORDER — ACETAMINOPHEN 500 MG PO TABS: 1000.0000 mg | ORAL_TABLET | Freq: Once | ORAL | Status: DC

## 2022-10-17 SURGICAL SUPPLY — 23 items
BAG URO CATCHER STRL LF (MISCELLANEOUS) ×1 IMPLANT
BASKET ZERO TIP NITINOL 2.4FR (BASKET) IMPLANT
BSKT STON RTRVL ZERO TP 2.4FR (BASKET)
CATH URETL OPEN 5X70 (CATHETERS) ×1 IMPLANT
CLOTH BEACON ORANGE TIMEOUT ST (SAFETY) ×1 IMPLANT
EXTRACTOR STONE NITINOL NGAGE (UROLOGICAL SUPPLIES) IMPLANT
FIBER LASER MOSES 200 DFL (Laser) IMPLANT
GLOVE SURG LX STRL 7.5 STRW (GLOVE) ×1 IMPLANT
GOWN SRG XL LVL 4 BRTHBL STRL (GOWNS) ×1 IMPLANT
GOWN STRL NON-REIN XL LVL4 (GOWNS) ×1
GUIDEWIRE STR DUAL SENSOR (WIRE) IMPLANT
GUIDEWIRE ZIPWRE .038 STRAIGHT (WIRE) ×1 IMPLANT
KIT TURNOVER KIT A (KITS) IMPLANT
LASER FIB FLEXIVA PULSE ID 365 (Laser) IMPLANT
MANIFOLD NEPTUNE II (INSTRUMENTS) ×1 IMPLANT
PACK CYSTO (CUSTOM PROCEDURE TRAY) ×1 IMPLANT
SHEATH NAVIGATOR HD 11/13X36 (SHEATH) IMPLANT
STENT URET 6FRX24 CONTOUR (STENTS) IMPLANT
STENT URET 6FRX26 CONTOUR (STENTS) IMPLANT
TRACTIP FLEXIVA PULS ID 200XHI (Laser) IMPLANT
TRACTIP FLEXIVA PULSE ID 200 (Laser)
TUBING CONNECTING 10 (TUBING) ×1 IMPLANT
TUBING UROLOGY SET (TUBING) ×1 IMPLANT

## 2022-10-17 NOTE — Telephone Encounter (Signed)
Left patient a vm regarding upcoming appointment  

## 2022-10-17 NOTE — Anesthesia Procedure Notes (Signed)
Procedure Name: LMA Insertion Date/Time: 10/17/2022 2:59 PM  Performed by: Pearson Grippe, CRNAPre-anesthesia Checklist: Patient identified, Emergency Drugs available, Suction available and Patient being monitored Patient Re-evaluated:Patient Re-evaluated prior to induction Oxygen Delivery Method: Circle system utilized Preoxygenation: Pre-oxygenation with 100% oxygen Induction Type: IV induction Ventilation: Mask ventilation without difficulty LMA: LMA inserted LMA Size: 4.0 Number of attempts: 1 Airway Equipment and Method: Bite block Placement Confirmation: positive ETCO2 Tube secured with: Tape Dental Injury: Teeth and Oropharynx as per pre-operative assessment

## 2022-10-17 NOTE — Transfer of Care (Signed)
Immediate Anesthesia Transfer of Care Note  Patient: Suzanne [name]  Procedure(s) Performed: CYSTOSCOPY, LEFT RETROGRADE PYELOGRAM, LEFT URETEROSCOPY, HOLMIUM LASER LITHOTRIPSY, BASKET EXTRACTION, AND LEFT URETERAL STENT PLACEMENT (Left)  Patient Location: PACU  Anesthesia Type:General  Level of Consciousness: awake, alert , and oriented  Airway & Oxygen Therapy: Patient Spontanous Breathing and Patient connected to face mask oxygen  Post-op Assessment: Report given to RN and Post -op Vital signs reviewed and stable  Post vital signs: Reviewed and stable  Last Vitals:  Vitals Value Taken Time  BP 147/77   Temp    Pulse 79 10/17/22 1541  Resp 18 10/17/22 1541  SpO2 100 % 10/17/22 1541  Vitals shown include unvalidated device data.  Last Pain:  Vitals:   10/17/22 1152  TempSrc: Oral  PainSc: 0-No pain      Patients Stated Pain Goal: 5 (10/17/22 1152)  Complications: No notable events documented.

## 2022-10-17 NOTE — Anesthesia Postprocedure Evaluation (Signed)
Anesthesia Post Note  Patient: LAWONA HAGLER  Procedure(s) Performed: CYSTOSCOPY, LEFT RETROGRADE PYELOGRAM, LEFT URETEROSCOPY, HOLMIUM LASER LITHOTRIPSY, BASKET EXTRACTION, AND LEFT URETERAL STENT PLACEMENT (Left)     Patient location during evaluation: Phase II Anesthesia Type: General Level of consciousness: awake and alert, patient cooperative and oriented Pain management: pain level controlled Vital Signs Assessment: post-procedure vital signs reviewed and stable Respiratory status: spontaneous breathing, nonlabored ventilation and respiratory function stable Cardiovascular status: blood pressure returned to baseline and stable Postop Assessment: no apparent nausea or vomiting, able to ambulate and adequate PO intake Anesthetic complications: no   No notable events documented.  Last Vitals:  Vitals:   10/17/22 1615 10/17/22 1712  BP: 136/81 138/74  Pulse: 73 73  Resp: (!) 21 20  Temp:    SpO2: 96% 96%    Last Pain:  Vitals:   10/17/22 1712  TempSrc:   PainSc: 0-No pain                 Shakirra Buehler,E. Petr Bontempo

## 2022-10-17 NOTE — Op Note (Signed)
Operative Note  Preoperative diagnosis:  1.  4 mm left UVJ stone  Postoperative diagnosis: 1.  4 mm left UVJ stone  Procedure(s): 1.  Cystoscopy with left ureteroscopy, holmium laser lithotripsy and left JJ stent placement 2.  Left retrograde pyelogram with intraoperative interpretation of fluoroscopic imaging  Surgeon: Rhoderick Moody, MD  Assistants:  None  Anesthesia:  General  Complications:  None  EBL: Less than 5 mL  Specimens: 1.  Left ureteral stone fragments  Drains/Catheters: 1.  Left 6 French, 24 cm JJ stent without tether  Intraoperative findings:   Left retrograde pyelogram revealed a filling defect within the distal aspects of the left ureter, consistent with the obstructing stone seen on recent cross-sectional imaging.  The remaining proximal aspects of the left ureter were uniformly dilated with no filling defects.  The left renal pelvis was mildly dilated with no internal filling defects. Obstructing 4 mm left UVJ stone  Indication:  Suzanne [name] is a 62 y.o. female with left flank pain secondary to an obstructing 4 mm left UVJ stone.  She has been consented for the above procedures, voices understanding and wishes to proceed.  Description of procedure:  After informed consent was obtained, the patient was brought to the operating room and general LMA anesthesia was administered. The patient was then placed in the dorsolithotomy position and prepped and draped in the usual sterile fashion. A timeout was performed. A 23 French rigid cystoscope was then inserted into the urethral meatus and advanced into the bladder under direct vision. A complete bladder survey revealed no intravesical pathology.  A 5 French ureteral catheter was then inserted into the left ureteral orifice and a retrograde pyelogram was obtained, with the findings listed above.  A Glidewire was then used to intubate the lumen of the ureteral catheter and was advanced up to the left renal  pelvis, under fluoroscopic guidance.  The catheter was then removed, leaving the wire in place.  A semirigid ureteroscope was then advanced to the distal aspect of the left ureter where an obstructing stone was identified.  A 200 m holmium laser was used to fracture the stone into numerous smaller pieces.  A 0 tip basket was then used to extract all stone fragments from the lumen of the left ureter.  A 6 French, 24 cm JJ stent was then advanced over the wire and into good position within the left collecting system, confirming placement via fluoroscopy.  The patient's bladder was drained.  She tolerated the procedure well and was transferred to the postanesthesia in stable condition.  Plan: Follow-up in 1 week for office cystoscopy and stent removal

## 2022-10-17 NOTE — Anesthesia Preprocedure Evaluation (Addendum)
Anesthesia Evaluation  Patient identified by MRN, date of birth, ID band Patient awake    Reviewed: Allergy & Precautions, NPO status , Patient's Chart, lab work & pertinent test results  History of Anesthesia Complications Negative for: history of anesthetic complications  Airway Mallampati: II  TM Distance: >3 FB Neck ROM: Full    Dental  (+) Poor Dentition, Dental Advisory Given   Pulmonary COPD,  COPD inhaler and oxygen dependent   breath sounds clear to auscultation       Cardiovascular hypertension, (-) angina  Rhythm:Regular Rate:Normal     Neuro/Psych    Depression    glaucoma    GI/Hepatic Neg liver ROS,GERD  Medicated and Controlled,,(+) Cirrhosis: stones.        Endo/Other  negative endocrine ROS    Renal/GU      Musculoskeletal   Abdominal   Peds  Hematology  (+) Blood dyscrasia (Hb 9.8), anemia   Anesthesia Other Findings Breast cancer: mets to lung and bone  Reproductive/Obstetrics                             Anesthesia Physical Anesthesia Plan  ASA: 4  Anesthesia Plan: General   Post-op Pain Management: Tylenol PO (pre-op)*   Induction: Intravenous  PONV Risk Score and Plan: 3 and Ondansetron, Dexamethasone and Scopolamine patch - Pre-op  Airway Management Planned: LMA  Additional Equipment: None  Intra-op Plan:   Post-operative Plan:   Informed Consent: I have reviewed the patients History and Physical, chart, labs and discussed the procedure including the risks, benefits and alternatives for the proposed anesthesia with the patient or authorized representative who has indicated his/her understanding and acceptance.     Dental advisory given  Plan Discussed with: CRNA and Surgeon  Anesthesia Plan Comments:        Anesthesia Quick Evaluation

## 2022-10-18 ENCOUNTER — Other Ambulatory Visit: Payer: Self-pay | Admitting: Hematology and Oncology

## 2022-10-18 ENCOUNTER — Telehealth: Payer: Self-pay | Admitting: Pharmacy Technician

## 2022-10-18 ENCOUNTER — Telehealth: Payer: Self-pay | Admitting: Hematology and Oncology

## 2022-10-18 ENCOUNTER — Other Ambulatory Visit (HOSPITAL_COMMUNITY): Payer: Self-pay

## 2022-10-18 ENCOUNTER — Telehealth: Payer: Self-pay

## 2022-10-18 ENCOUNTER — Other Ambulatory Visit: Payer: Self-pay

## 2022-10-18 ENCOUNTER — Encounter (HOSPITAL_COMMUNITY): Payer: Self-pay | Admitting: Urology

## 2022-10-18 DIAGNOSIS — C7951 Secondary malignant neoplasm of bone: Secondary | ICD-10-CM

## 2022-10-18 DIAGNOSIS — C50811 Malignant neoplasm of overlapping sites of right female breast: Secondary | ICD-10-CM | POA: Diagnosis not present

## 2022-10-18 MED ORDER — CAPIVASERTIB 200 MG PO TABS
400.0000 mg | ORAL_TABLET | Freq: Two times a day (BID) | ORAL | 1 refills | Status: DC
  Filled 2022-10-18: qty 64, 16d supply, fill #0

## 2022-10-18 NOTE — Telephone Encounter (Signed)
Oral Oncology Pharmacist Encounter  Received new prescription for Truqap (capivasertib) for the treatment of ER positive, HER2 negative, mestastatic breast cancer with AKT1 mutation in conjunction with faslodex, planned duration until disease progression or unacceptable toxicity.  Labs from 09/26/22 assessed, no interventions needed. Will discuss new labs with MD. Prescription dose and frequency assessed for appropriateness.  Current medication list in Epic reviewed, DDIs with Truqap identified: - pantoprazole: PPIs decrease the concentration of Truqap but no action required (category B)  Evaluated chart and no patient barriers to medication adherence noted.   Patient agreement for treatment documented in MD note on 10/16/22.  Prescription has been e-scribed to the [facility] for benefits analysis and approval.  Oral Oncology Clinic will continue to follow for insurance authorization, copayment issues, initial counseling and start date.  Bethel Born, PharmD Hematology/Oncology Clinical Pharmacist [facility] Chemotherapy Navigation [facility] [phone] 10/18/2022 11:15 AM

## 2022-10-18 NOTE — Telephone Encounter (Signed)
Spoke with patient confirming upcoming appointment changes

## 2022-10-18 NOTE — Telephone Encounter (Addendum)
Oral Oncology Patient Advocate Encounter   Received notification that prior authorization for Truqap is required.   PA submitted on 10/18/22 Key [id number] Status is pending     Jinger Neighbors, CPhT-Adv Oncology Pharmacy Patient Advocate [facility] Direct Number: [phone]  Fax: [phone]

## 2022-10-19 ENCOUNTER — Other Ambulatory Visit (HOSPITAL_COMMUNITY): Payer: Self-pay

## 2022-10-19 DIAGNOSIS — C50811 Malignant neoplasm of overlapping sites of right female breast: Secondary | ICD-10-CM | POA: Diagnosis not present

## 2022-10-19 NOTE — Telephone Encounter (Signed)
Oral Oncology Patient Advocate Encounter  Prior Authorization for [place] has been approved.    PA#  [id number] Effective dates: 10/18/22 through 10/18/23  Patients co-pay is $4.    Jinger Neighbors, CPhT-Adv Oncology Pharmacy Patient Advocate [facility] Direct Number: [phone]  Fax: [phone]

## 2022-10-22 DIAGNOSIS — C50811 Malignant neoplasm of overlapping sites of right female breast: Secondary | ICD-10-CM | POA: Diagnosis not present

## 2022-10-23 DIAGNOSIS — C50811 Malignant neoplasm of overlapping sites of right female breast: Secondary | ICD-10-CM | POA: Diagnosis not present

## 2022-10-24 ENCOUNTER — Other Ambulatory Visit: Payer: Self-pay

## 2022-10-24 ENCOUNTER — Other Ambulatory Visit: Payer: Self-pay | Admitting: *Deleted

## 2022-10-24 ENCOUNTER — Other Ambulatory Visit (HOSPITAL_COMMUNITY): Payer: Self-pay

## 2022-10-24 ENCOUNTER — Telehealth: Payer: Self-pay | Admitting: Pharmacy Technician

## 2022-10-24 DIAGNOSIS — R1084 Generalized abdominal pain: Secondary | ICD-10-CM | POA: Diagnosis not present

## 2022-10-24 DIAGNOSIS — E66812 Obesity, class 2: Secondary | ICD-10-CM

## 2022-10-24 DIAGNOSIS — R8271 Bacteriuria: Secondary | ICD-10-CM | POA: Diagnosis not present

## 2022-10-24 DIAGNOSIS — Z7189 Other specified counseling: Secondary | ICD-10-CM

## 2022-10-24 DIAGNOSIS — E669 Obesity, unspecified: Secondary | ICD-10-CM

## 2022-10-24 DIAGNOSIS — N201 Calculus of ureter: Secondary | ICD-10-CM | POA: Diagnosis not present

## 2022-10-24 MED ORDER — CAPIVASERTIB 160 MG PO TABS
320.0000 mg | ORAL_TABLET | Freq: Two times a day (BID) | ORAL | 1 refills | Status: DC
Start: 2022-10-24 — End: 2022-11-23
  Filled 2022-10-24: qty 64, 16d supply, fill #0
  Filled 2022-10-25: qty 64, 28d supply, fill #0

## 2022-10-24 NOTE — Telephone Encounter (Signed)
Oral Oncology Patient Advocate Encounter   Received notification that prior authorization for Truqap 160mg  is required.   PA submitted on 10/24/22 Key [id number] Status is pending     Jinger Neighbors, CPhT-Adv Oncology Pharmacy Patient Advocate [facility] Direct Number: [phone]  Fax: [phone]

## 2022-10-24 NOTE — Telephone Encounter (Signed)
Oral Oncology Patient Advocate Encounter  Prior Authorization for Suzanne [name] has been approved.    PA#  [id number] Effective dates: 10/24/22 through 10/24/23  Patients co-pay is $4.    Jinger Neighbors, CPhT-Adv Oncology Pharmacy Patient Advocate [facility] Direct Number: [phone]  Fax: [phone]

## 2022-10-25 ENCOUNTER — Other Ambulatory Visit (HOSPITAL_COMMUNITY): Payer: Self-pay

## 2022-10-25 ENCOUNTER — Ambulatory Visit: Payer: Medicaid Other | Admitting: Adult Health

## 2022-10-25 ENCOUNTER — Inpatient Hospital Stay: Payer: Medicaid Other

## 2022-10-25 ENCOUNTER — Other Ambulatory Visit: Payer: Self-pay

## 2022-10-25 ENCOUNTER — Ambulatory Visit: Payer: Medicaid Other | Admitting: Dermatology

## 2022-10-25 VITALS — BP 127/75 | HR 104 | Temp 99.1°F | Resp 18

## 2022-10-25 DIAGNOSIS — Z170 Estrogen receptor positive status [ER+]: Secondary | ICD-10-CM

## 2022-10-25 DIAGNOSIS — Z7189 Other specified counseling: Secondary | ICD-10-CM

## 2022-10-25 DIAGNOSIS — E669 Obesity, unspecified: Secondary | ICD-10-CM

## 2022-10-25 DIAGNOSIS — C7951 Secondary malignant neoplasm of bone: Secondary | ICD-10-CM

## 2022-10-25 DIAGNOSIS — Z5111 Encounter for antineoplastic chemotherapy: Secondary | ICD-10-CM | POA: Diagnosis not present

## 2022-10-25 LAB — CBC WITH DIFFERENTIAL (CANCER CENTER ONLY)
Abs Immature Granulocytes: 0.03 10*3/uL (ref 0.00–0.07)
Basophils Absolute: 0 10*3/uL (ref 0.0–0.1)
Basophils Relative: 1 %
Eosinophils Absolute: 0.1 10*3/uL (ref 0.0–0.5)
Eosinophils Relative: 1 %
HCT: 31.4 % — ABNORMAL LOW (ref 36.0–46.0)
Hemoglobin: 9.4 g/dL — ABNORMAL LOW (ref 12.0–15.0)
Immature Granulocytes: 1 %
Lymphocytes Relative: 13 %
Lymphs Abs: 0.8 10*3/uL (ref 0.7–4.0)
MCH: 25.5 pg — ABNORMAL LOW (ref 26.0–34.0)
MCHC: 29.9 g/dL — ABNORMAL LOW (ref 30.0–36.0)
MCV: 85.1 fL (ref 80.0–100.0)
Monocytes Absolute: 0.5 10*3/uL (ref 0.1–1.0)
Monocytes Relative: 8 %
Neutro Abs: 4.9 10*3/uL (ref 1.7–7.7)
Neutrophils Relative %: 76 %
Platelet Count: 290 10*3/uL (ref 150–400)
RBC: 3.69 MIL/uL — ABNORMAL LOW (ref 3.87–5.11)
RDW: 17.9 % — ABNORMAL HIGH (ref 11.5–15.5)
WBC Count: 6.4 10*3/uL (ref 4.0–10.5)
nRBC: 0 % (ref 0.0–0.2)

## 2022-10-25 LAB — CMP (CANCER CENTER ONLY)
ALT: 33 U/L (ref 0–44)
AST: 51 U/L — ABNORMAL HIGH (ref 15–41)
Albumin: 3.4 g/dL — ABNORMAL LOW (ref 3.5–5.0)
Alkaline Phosphatase: 167 U/L — ABNORMAL HIGH (ref 38–126)
Anion gap: 9 (ref 5–15)
BUN: 13 mg/dL (ref 8–23)
CO2: 27 mmol/L (ref 22–32)
Calcium: 8.7 mg/dL — ABNORMAL LOW (ref 8.9–10.3)
Chloride: 101 mmol/L (ref 98–111)
Creatinine: 0.78 mg/dL (ref 0.44–1.00)
GFR, Estimated: >60 mL/min (ref >60)
Glucose, Bld: 119 mg/dL — ABNORMAL HIGH (ref 70–99)
Potassium: 3.5 mmol/L (ref 3.5–5.1)
Sodium: 137 mmol/L (ref 135–145)
Total Bilirubin: 0.7 mg/dL (ref 0.3–1.2)
Total Protein: 7.2 g/dL (ref 6.5–8.1)

## 2022-10-25 LAB — VITAMIN B12: Vitamin B-12: 455 pg/mL (ref 180–914)

## 2022-10-25 MED ORDER — DENOSUMAB 120 MG/1.7ML ~~LOC~~ SOLN
120.0000 mg | Freq: Once | SUBCUTANEOUS | Status: AC
  Administered 2022-10-25: 120 mg via SUBCUTANEOUS
  Filled 2022-10-25: qty 1.7

## 2022-10-25 MED ORDER — FULVESTRANT 250 MG/5ML IM SOSY
500.0000 mg | PREFILLED_SYRINGE | Freq: Once | INTRAMUSCULAR | Status: AC
  Administered 2022-10-25: 500 mg via INTRAMUSCULAR
  Filled 2022-10-25: qty 10

## 2022-10-25 MED ORDER — FULVESTRANT 250 MG/5ML IM SOSY: 500.0000 mg | PREFILLED_SYRINGE | Freq: Once | INTRAMUSCULAR | Status: DC

## 2022-10-26 ENCOUNTER — Other Ambulatory Visit: Payer: Self-pay | Admitting: *Deleted

## 2022-10-26 DIAGNOSIS — C50811 Malignant neoplasm of overlapping sites of right female breast: Secondary | ICD-10-CM | POA: Diagnosis not present

## 2022-10-26 LAB — HEMOGLOBIN A1C
Hgb A1c MFr Bld: 5.5 % (ref 4.8–5.6)
Mean Plasma Glucose: 111 mg/dL

## 2022-10-26 MED ORDER — ONDANSETRON 4 MG PO TBDP: 4.0000 mg | ORAL_TABLET | Freq: Three times a day (TID) | ORAL | 3 refills | Status: DC | PRN

## 2022-10-26 NOTE — Telephone Encounter (Signed)
This RN spoke with pt and daughter,Suzanne [name] per refill of her zofran request- verified pharmacy and refills sent.  Note they were contacted by the mail order company pharmacist who stated recommendation for pt to check her blood sugar weekly.  Suzanne [name] is asking how to proceed and if she needs a glucometer ?  This note will be forwarded to our oral chemo pharmacist for review and recommendations.   Pt's next appt here is not until 11/08/2022 with a lab.

## 2022-10-27 DIAGNOSIS — J9621 Acute and chronic respiratory failure with hypoxia: Secondary | ICD-10-CM | POA: Diagnosis not present

## 2022-10-27 DIAGNOSIS — S22000A Wedge compression fracture of unspecified thoracic vertebra, initial encounter for closed fracture: Secondary | ICD-10-CM | POA: Diagnosis not present

## 2022-10-27 DIAGNOSIS — C7801 Secondary malignant neoplasm of right lung: Secondary | ICD-10-CM | POA: Diagnosis not present

## 2022-10-27 DIAGNOSIS — R0602 Shortness of breath: Secondary | ICD-10-CM | POA: Diagnosis not present

## 2022-10-27 DIAGNOSIS — N19 Unspecified kidney failure: Secondary | ICD-10-CM | POA: Diagnosis not present

## 2022-10-27 DIAGNOSIS — R6510 Systemic inflammatory response syndrome (SIRS) of non-infectious origin without acute organ dysfunction: Secondary | ICD-10-CM | POA: Diagnosis not present

## 2022-10-27 DIAGNOSIS — J309 Allergic rhinitis, unspecified: Secondary | ICD-10-CM | POA: Diagnosis not present

## 2022-10-27 DIAGNOSIS — J40 Bronchitis, not specified as acute or chronic: Secondary | ICD-10-CM | POA: Diagnosis not present

## 2022-10-27 LAB — CANCER ANTIGEN 27.29: CA 27.29: 2364.6 U/mL — ABNORMAL HIGH (ref 0.0–38.6)

## 2022-10-29 ENCOUNTER — Other Ambulatory Visit: Payer: Self-pay

## 2022-10-29 DIAGNOSIS — C50811 Malignant neoplasm of overlapping sites of right female breast: Secondary | ICD-10-CM | POA: Diagnosis not present

## 2022-10-29 NOTE — Progress Notes (Signed)
Received secure chat from [facility] stating that Pt will need to monitor fasting blood glucose weekly while on Truqap. [name] states Pt would like to check glucose at home instead of coming in office weekly. Pt requesting prescription so that insurance will cover. After speaking with several pharmacies, glucose monitor kit not covered by insurance. Pt also reports that transportation is an issue and requires things be delivered. Instructed Pt to consider glucose monitoring kit found on [employer] for less than $35 that will last over a year. Pt verbalized understanding and will receive same day shipping as to check blood glucose on 10/31/22. Message relayed to [facility]. RPH and Pt verbalized understanding.

## 2022-10-30 ENCOUNTER — Other Ambulatory Visit (HOSPITAL_COMMUNITY): Payer: Self-pay

## 2022-10-30 DIAGNOSIS — C50811 Malignant neoplasm of overlapping sites of right female breast: Secondary | ICD-10-CM | POA: Diagnosis not present

## 2022-10-31 DIAGNOSIS — C50811 Malignant neoplasm of overlapping sites of right female breast: Secondary | ICD-10-CM | POA: Diagnosis not present

## 2022-11-01 ENCOUNTER — Other Ambulatory Visit: Payer: Self-pay | Admitting: *Deleted

## 2022-11-01 DIAGNOSIS — C50811 Malignant neoplasm of overlapping sites of right female breast: Secondary | ICD-10-CM | POA: Diagnosis not present

## 2022-11-01 MED ORDER — METFORMIN HCL 500 MG PO TABS: 500.0000 mg | ORAL_TABLET | Freq: Every day | ORAL | 3 refills | Status: DC

## 2022-11-02 ENCOUNTER — Encounter: Payer: Self-pay | Admitting: Hematology and Oncology

## 2022-11-02 ENCOUNTER — Telehealth: Payer: Self-pay

## 2022-11-02 DIAGNOSIS — C50811 Malignant neoplasm of overlapping sites of right female breast: Secondary | ICD-10-CM | POA: Diagnosis not present

## 2022-11-02 NOTE — Telephone Encounter (Signed)
Oral Oncology Pharmacist Encounter  Received notification on 11/01/22 from patients daughter regarding fasting blood glucose reading. At this point she had only taken 3 days of medication and this is prior to her taking the medication on Thursday, 6/6. Patient states that her blood sugar reported was 250. Discussed with Dr. Al Pimple and patient will be started on metformin and will monitor fasting blood sugars daily until Monday.   Discussed with patient and patients daughter to start on the metformin as soon as they get it and to hold the truqap as these next 3 days are a part of her off days. Notified them that I will call them on Monday morning and not to start the Truqap back until discussed with me to see what the blood sugars are for the past 4 days prior to starting.   I answered questions patient had regarding metformin and that she will continue this medication to decrease blood sugars while being on this medication. Patient and patients daughter understood and were appreciative with the information.   Bethel Born, PharmD Hematology/Oncology Clinical Pharmacist [facility] [phone]

## 2022-11-02 NOTE — Telephone Encounter (Signed)
Oral Chemotherapy Pharmacist Encounter  I spoke with patient for overview of: Truqap (capivasertib) for the treatment of ER positive, HER2 negative, mestastatic breast cancer with AKT1 mutation in conjunction with faslodex, planned duration until disease progression or unacceptable toxicity.   Counseled patient on administration, dosing, side effects, monitoring, drug-food interactions, safe handling, storage, and disposal.  Patient will take Truqap 160mg  tablets, 2 tablets (320mg ) by mouth twice daily for 4 days on, 3 days off, repeated every 7 days.   Truqap start date: 10/29/2022  Adverse effects include but are not limited to: fatigue, decreased blood counts, GI upset, diarrhea, hyperglycemia, mouth sores, and rash.   Patient will avoid grapefruit and grapefruit juice.  Patient has anti-emetic on hand and knows to take it if nausea develops.   Patient will obtain anti diarrheal and alert the office of 4 or more loose stools above baseline.  Reviewed with patient importance of keeping a medication schedule and plan for any missed doses. No barriers to medication adherence identified.  Medication reconciliation performed and medication/allergy list updated.  This will ship from the [facility] outpatient pharmacy on 10/25/22 to deliver to patient's home on 10/26/22.  Patient informed the pharmacy will reach out 5-7 days prior to needing next fill of Xeloda to coordinate continued medication acquisition to prevent break in therapy.  All questions answered.  Patient voiced understanding and appreciation.   Medication education handout placed in mail for patient. Patient knows to call the office with questions or concerns. Oral Chemotherapy Clinic phone number provided to patient.   Bethel Born, PharmD Hematology/Oncology Clinical Pharmacist [facility] [phone]

## 2022-11-05 NOTE — Telephone Encounter (Signed)
Oral Oncology Pharmacist Encounter   Spoke to patient regarding fasting blood glucose readings and they are as follows:   11/02/22: 120 11/03/22: 107 11/04/22: 70 11/05/22: 108  Notified patient to go ahead and restart the Truqap this morning 11/05/22 (week 2). So far patient only missed 1 day of therapy (11/01/22). Patient was not on the metformin during these dates and now has it although I will coordinate with patient when to start the metformin and will see what patients BG is tomorrow morning.   Bethel Born, PharmD Hematology/Oncology Clinical Pharmacist [facility] [phone]

## 2022-11-06 DIAGNOSIS — C50811 Malignant neoplasm of overlapping sites of right female breast: Secondary | ICD-10-CM | POA: Diagnosis not present

## 2022-11-06 NOTE — Telephone Encounter (Signed)
Oral Oncology Pharmacist Encounter  Patient called to inform me today her blood glucose level is 93. Informed patient to not take Metformin today. Will follow up with patient on Thursday to see what the blood glucose level was on Wed and Thursday.   Bethel Born, PharmD Hematology/Oncology Clinical Pharmacist [facility] [phone]

## 2022-11-07 ENCOUNTER — Telehealth: Payer: Self-pay

## 2022-11-07 DIAGNOSIS — C50811 Malignant neoplasm of overlapping sites of right female breast: Secondary | ICD-10-CM | POA: Diagnosis not present

## 2022-11-07 NOTE — Telephone Encounter (Signed)
Returned daughter's call regarding emesis. [name] states that Pt vomited immediately after taking truqap and is asking if they need to re-dose. Per Hector Shade, Pt is NOT to re-dose. [name] states Pt has had 3 episodes of emesis, 2 episodes of diarrhea, and fatigue in the last 24 hours. Denies fever, dyspnea, or pain. [name] reports she herself had similar sx 3 days ago which resolved on their own. Encouraged [name] to give Pt zofran and imodium as needed. [name] states Pt is drinking liquids. Encouraged [name] to call back if sx worsen and Pt is unable to tolerate liquids. [name] verbalized understanding.

## 2022-11-08 ENCOUNTER — Inpatient Hospital Stay: Payer: Medicaid Other

## 2022-11-08 ENCOUNTER — Other Ambulatory Visit (HOSPITAL_COMMUNITY): Payer: Self-pay

## 2022-11-08 ENCOUNTER — Other Ambulatory Visit: Payer: Self-pay

## 2022-11-08 ENCOUNTER — Inpatient Hospital Stay: Payer: Medicaid Other | Attending: Adult Health | Admitting: Hematology and Oncology

## 2022-11-08 VITALS — BP 122/80 | HR 108 | Temp 97.2°F | Resp 18 | Ht 59.0 in | Wt 165.3 lb

## 2022-11-08 DIAGNOSIS — E860 Dehydration: Secondary | ICD-10-CM

## 2022-11-08 DIAGNOSIS — C7951 Secondary malignant neoplasm of bone: Secondary | ICD-10-CM | POA: Diagnosis not present

## 2022-11-08 DIAGNOSIS — Z5111 Encounter for antineoplastic chemotherapy: Secondary | ICD-10-CM | POA: Diagnosis present

## 2022-11-08 DIAGNOSIS — C50811 Malignant neoplasm of overlapping sites of right female breast: Secondary | ICD-10-CM | POA: Insufficient documentation

## 2022-11-08 DIAGNOSIS — R197 Diarrhea, unspecified: Secondary | ICD-10-CM | POA: Diagnosis not present

## 2022-11-08 DIAGNOSIS — Z79899 Other long term (current) drug therapy: Secondary | ICD-10-CM | POA: Diagnosis not present

## 2022-11-08 DIAGNOSIS — T451X5A Adverse effect of antineoplastic and immunosuppressive drugs, initial encounter: Secondary | ICD-10-CM | POA: Diagnosis not present

## 2022-11-08 DIAGNOSIS — R112 Nausea with vomiting, unspecified: Secondary | ICD-10-CM | POA: Diagnosis not present

## 2022-11-08 DIAGNOSIS — Z170 Estrogen receptor positive status [ER+]: Secondary | ICD-10-CM

## 2022-11-08 LAB — CMP (CANCER CENTER ONLY)
ALT: 16 U/L (ref 0–44)
AST: 32 U/L (ref 15–41)
Albumin: 3.4 g/dL — ABNORMAL LOW (ref 3.5–5.0)
Alkaline Phosphatase: 191 U/L — ABNORMAL HIGH (ref 38–126)
Anion gap: 8 (ref 5–15)
BUN: 19 mg/dL (ref 8–23)
CO2: 25 mmol/L (ref 22–32)
Calcium: 8.1 mg/dL — ABNORMAL LOW (ref 8.9–10.3)
Chloride: 107 mmol/L (ref 98–111)
Creatinine: 1.08 mg/dL — ABNORMAL HIGH (ref 0.44–1.00)
GFR, Estimated: 58 mL/min — ABNORMAL LOW (ref >60)
Glucose, Bld: 122 mg/dL — ABNORMAL HIGH (ref 70–99)
Potassium: 3.6 mmol/L (ref 3.5–5.1)
Sodium: 140 mmol/L (ref 135–145)
Total Bilirubin: 0.4 mg/dL (ref 0.3–1.2)
Total Protein: 7.2 g/dL (ref 6.5–8.1)

## 2022-11-08 LAB — CBC WITH DIFFERENTIAL (CANCER CENTER ONLY)
Abs Immature Granulocytes: 0.01 10*3/uL (ref 0.00–0.07)
Basophils Absolute: 0 10*3/uL (ref 0.0–0.1)
Basophils Relative: 1 %
Eosinophils Absolute: 0.3 10*3/uL (ref 0.0–0.5)
Eosinophils Relative: 5 %
HCT: 36.2 % (ref 36.0–46.0)
Hemoglobin: 10.9 g/dL — ABNORMAL LOW (ref 12.0–15.0)
Immature Granulocytes: 0 %
Lymphocytes Relative: 19 %
Lymphs Abs: 0.9 10*3/uL (ref 0.7–4.0)
MCH: 26 pg (ref 26.0–34.0)
MCHC: 30.1 g/dL (ref 30.0–36.0)
MCV: 86.2 fL (ref 80.0–100.0)
Monocytes Absolute: 0.3 10*3/uL (ref 0.1–1.0)
Monocytes Relative: 7 %
Neutro Abs: 3.3 10*3/uL (ref 1.7–7.7)
Neutrophils Relative %: 68 %
Platelet Count: 443 10*3/uL — ABNORMAL HIGH (ref 150–400)
RBC: 4.2 MIL/uL (ref 3.87–5.11)
RDW: 18 % — ABNORMAL HIGH (ref 11.5–15.5)
WBC Count: 4.8 10*3/uL (ref 4.0–10.5)
nRBC: 0 % (ref 0.0–0.2)

## 2022-11-08 LAB — VITAMIN B12: Vitamin B-12: 392 pg/mL (ref 180–914)

## 2022-11-08 MED ORDER — SODIUM CHLORIDE 0.9 % IV SOLN: INTRAVENOUS | Status: DC

## 2022-11-08 MED ORDER — FULVESTRANT 250 MG/5ML IM SOSY
500.0000 mg | PREFILLED_SYRINGE | Freq: Once | INTRAMUSCULAR | Status: AC
  Administered 2022-11-08: 500 mg via INTRAMUSCULAR
  Filled 2022-11-08: qty 10

## 2022-11-08 NOTE — Progress Notes (Signed)
[facility] Cancer Follow up:   [name], NP [address] [place] [address] [place] [address]  DIAGNOSIS: Metastatic breast cancer  SUMMARY OF ONCOLOGIC HISTORY:  [name] 10/26/2018 with right-sided inflammatory breast cancer, stage IV, involving lungs, lymph nodes and bones, as follows:             (a) chest CT scan 10/26/2018 shows bilateral pleural effusions, possible lymphangitic spread of tumor, diffuse bony metastatic disease, and significant axillary mediastinal and hilar adenopathy             (b) bone scan 10/27/2018 is a "near SuperScan" consistent with widespread bony metastatic disease             (c) head CT with and without contrast 10/30/2018 shows no intracranial metastatic disease, multiple calvarial lesions             (d) CA-27-29 on 10/27/2018 was 1810.4   (1) pleural fluid from right thoracentesis 10/28/2018 confirms malignant cells consistent with a breast primary, strongly estrogen and progesterone receptor positive, HER-2 not amplified, with an MIB-1 of 2%   (2) anastrozole started 10/29/2018             (a) palbociclib started 11/13/2018 at 100 mg dose             (b) denosumab/xgeva started 11/13/2018              (C) palbociclib dose decreased to 75mg              (D) Palbociclib dose decreased to 75 mg every other day in 11/2021   (3) associated problems:             (a) hypoxia secondary to effusions             (b) pain from bone lesions and left humeral and vertebral compression fractures             (c) right upper extremity lymphedema             (d) poor venous access   (4) genetics testing on 12/25/2018 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 STK11, TP53, TSC1, TSC2, and VHL.  The following genes were evaluated for sequence changes only: SDHA and HOXB13 c.251G>A variant only. The [facility] Melanoma Panel analyzed the following 9 genes: BAP1 BRCA2 CDK4 CDKN2A MITF POT1 PTEN RB1 Tp53.    (5) palliative radiation to the left humerus 07/20/2019 - 07/31/2019 Site Technique Total Dose (Gy) Dose per Fx (Gy) Completed Fx Beam Energies  Humerus, Left: Ext_Lt_humerus Complex 30/30 3 10/10 6X    (6) anemia requiring transfusion:              (a) B-12 level 134 FEB 2022: B-12 injectons started              (B) iron deficient, IV iron to start 2/6  (C) Hospitalized with upper GI bleed 05/27/2022-05/30/2022  (7) Elacestrant started 04/30/2022, progression noted on imaging from May  2024  INTERVAL HISTORY:  MALCOLM AGRO 62 y.o. female returns for follow-up.   Given progression on most recent imaging we have discussed about capevasartib with faslodex. She had 1 episode of hypoglycemia on the fasting blood glucose but it reverted back to normal hence she restarted this Monday.  Yesterday however she had a very rough day with multiple episodes of nausea/vomiting/diarrhea.  She took multiple Imodium and she slept pretty much the later part of the day.  This morning she was really worried to take the dose.  This morning she only had 1 episode of diarrhea she says.  No fevers or chills. Rest of the pertinent 10 point ROS reviewed and neg.   Patient Active Problem List   Diagnosis Date Noted   UGI bleed 05/27/2022   Acute prerenal azotemia 04/18/2022   Dehydration 04/18/2022   SIRS (systemic inflammatory response syndrome) (HCC) 04/18/2022   Allergic rhinitis 04/18/2022   Depression  04/18/2022   Anemia of chronic disease 04/18/2022   Bronchitis 04/18/2022   Acute bronchitis 04/17/2022   Intertrigo 01/10/2022   Bacterial vaginitis 01/10/2022   Sepsis due to cellulitis (HCC) 12/06/2021   B12 deficiency anemia 08/15/2020   Sensorineural hearing loss (SNHL) of both ears 08/07/2019   Intractable back pain 02/03/2019   Thoracic compression fracture, closed, initial encounter (HCC) 02/03/2019   Essential hypertension 02/03/2019   Genetic testing 01/27/2019   Acute pharyngitis    Bacteremia    FTT (failure to thrive) in adult    Fracture closed, humerus, shaft 01/09/2019   Pneumonia 12/25/2018   Acute respiratory disorder in immunocompromised patient (HCC) 12/25/2018   Family history of breast cancer    Family history of prostate cancer    Family history of melanoma    Family history of colon cancer    Pressure injury of skin 11/24/2018   Chronic respiratory failure with hypoxia and hypercapnia (HCC) 11/19/2018   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   Hypophosphatemia 11/19/2018   Hypocalcemia 11/19/2018   Aspiration pneumonia (HCC) 11/19/2018   SOB (shortness of breath)    Palliative care by specialist    Carcinoma of breast, estrogen receptor positive, stage 4, right (HCC) 11/16/2018   Hypoalbuminemia 11/16/2018   Pleural effusion 11/14/2018   Goals of care, counseling/discussion 11/06/2018   Malignant neoplasm of overlapping sites of right breast in female, estrogen receptor positive (HCC) 10/31/2018   Lung metastasis 10/31/2018   Pain from bone metastases (HCC) 10/31/2018   Class 2 obesity 10/31/2018   Bone injury    Pleural effusion, bilateral    Shortness of breath    Abnormal breast finding    Hypokalemia    Breast skin changes    Macrocytic anemia    Malignant neoplasm metastatic to bone (HCC)    Anemia, B12 deficiency 10/26/2018    is allergic to ferumoxytol.  MEDICAL HISTORY: Past Medical History:  Diagnosis Date   Anemia    Cancer  (HCC)    Family history of breast cancer    Family history of colon cancer    Family history of melanoma    Family history of prostate cancer    Gallstones    History of hiatal hernia    History of kidney stones    Mastitis    reports history of recurrent mastitis   Metastatic breast cancer    Obesity    Psoriasis     SURGICAL HISTORY: Past Surgical History:  Procedure Laterality Date   CYSTOSCOPY/URETEROSCOPY/HOLMIUM LASER/STENT PLACEMENT Left 10/17/2022  Procedure: CYSTOSCOPY, LEFT RETROGRADE PYELOGRAM, LEFT URETEROSCOPY, HOLMIUM LASER LITHOTRIPSY, BASKET EXTRACTION, AND LEFT URETERAL STENT PLACEMENT;  Surgeon: Rene Paci, MD;  Location: [facility];  Service: Urology;  Laterality: Left;  45 MINUTES   ESOPHAGOGASTRODUODENOSCOPY (EGD) WITH PROPOFOL N/A 05/27/2022   Procedure: ESOPHAGOGASTRODUODENOSCOPY (EGD) WITH PROPOFOL;  Surgeon: Willis Modena, MD;  Location: [facility];  Service: Gastroenterology;  Laterality: N/A;   EYE SURGERY     cataract   GLAUCOMA SURGERY  late 1990's   IR THORACENTESIS ASP PLEURAL SPACE W/IMG GUIDE  10/28/2018   IR THORACENTESIS ASP PLEURAL SPACE W/IMG GUIDE  10/29/2018   OPEN REDUCTION INTERNAL FIXATION (ORIF) DISTAL RADIAL FRACTURE Left 01/09/2019   Procedure: OPEN REDUCTION INTERNAL FIXATION (ORIF) HUMERAL FRACTURE;  Surgeon: Beverely Low, MD;  Location: [facility];  Service: Orthopedics;  Laterality: Left;    SOCIAL HISTORY: Social History   Socioeconomic History   Marital status: Widowed    Spouse name: Not on file   Number of children: 1   Years of education: Not on file   Highest education level: Not on file  Occupational History   Occupation: Visual merchandiser Programs    Comment: Self-employed  Tobacco Use   Smoking status: Never   Smokeless tobacco: Never   Tobacco comments:    second hand smoke exposure  Vaping Use   Vaping Use: Never used  Substance and Sexual Activity   Alcohol use: No   Drug use: No   Sexual  activity: Not Currently  Other Topics Concern   Not on file  Social History Narrative   Has a daughter named [name]. Daughter has CP.   Social Determinants of Health   Financial Resource Strain: Low Risk  (09/17/2022)   Overall Financial Resource Strain (CARDIA)    Difficulty of Paying Living Expenses: Not hard at all  Food Insecurity: No Food Insecurity (09/17/2022)   Hunger Vital Sign    Worried About Running Out of Food in the Last Year: Never true    Ran Out of Food in the Last Year: Never true  Transportation Needs: No Transportation Needs (09/17/2022)   PRAPARE - Administrator, Civil Service (Medical): No    Lack of Transportation (Non-Medical): No  Physical Activity: Not on file  Stress: Stress Concern Present (11/08/2021)   [employer] of Occupational Health - Occupational Stress Questionnaire    Feeling of Stress : To some extent  Social Connections: Socially Isolated (03/27/2021)   Social Connection and Isolation Panel [NHANES]    Frequency of Communication with Friends and Family: Once a week    Frequency of Social Gatherings with Friends and Family: More than three times a week    Attends Religious Services: Never    Database administrator or Organizations: No    Attends Banker Meetings: Never    Marital Status: Widowed  Intimate Partner Violence: Not At Risk (09/17/2022)   Humiliation, Afraid, Rape, and Kick questionnaire    Fear of Current or Ex-Partner: No    Emotionally Abused: No    Physically Abused: No    Sexually Abused: No    FAMILY HISTORY: Family History  Problem Relation Age of Onset   Breast cancer Sister        in her 69's-70s   Heart disease Brother        CABG   Heart disease Brother        CABG   Skin cancer Brother        melanoma  Colon cancer Cousin    Heart attack Mother    Heart attack Father    Prostate cancer Brother     Review of Systems  Constitutional:  Negative for appetite change, chills,  fatigue, fever and unexpected weight change.  HENT:   Negative for hearing loss, lump/mass and trouble swallowing.   Eyes:  Negative for eye problems and icterus.  Respiratory:  Positive for shortness of breath (at baseline). Negative for chest tightness and cough.   Cardiovascular:  Negative for chest pain, leg swelling and palpitations.  Gastrointestinal:  Positive for diarrhea, nausea and vomiting. Negative for abdominal distention, abdominal pain and constipation.  Endocrine: Negative for hot flashes.  Genitourinary:  Negative for difficulty urinating.   Musculoskeletal:  Negative for arthralgias.  Skin:  Negative for itching and rash.  Neurological:  Negative for dizziness, extremity weakness, headaches and numbness.  Hematological:  Negative for adenopathy. Does not bruise/bleed easily.  Psychiatric/Behavioral:  Negative for depression. The patient is not nervous/anxious.       PHYSICAL EXAMINATION  ECOG PERFORMANCE STATUS: 2 - Symptomatic, <50% confined to bed  Vitals:   11/08/22 1434  BP: 122/80  Pulse: (!) 108  Resp: 18  Temp: (!) 97.2 F (36.2 C)  SpO2: 99%     She appears well today, in no acute distress Neck: No palpable adenopathy Chest clear to auscultation bilaterally Heart: Regular rate and rhythm regular, mild tachycardia No lower extremity edema  LABORATORY DATA:  CBC    Component Value Date/Time   WBC 4.8 11/08/2022 1420   WBC 5.1 10/16/2022 1301   RBC 4.20 11/08/2022 1420   HGB 10.9 (L) 11/08/2022 1420   HCT 36.2 11/08/2022 1420   PLT 443 (H) 11/08/2022 1420   MCV 86.2 11/08/2022 1420   MCH 26.0 11/08/2022 1420   MCHC 30.1 11/08/2022 1420   RDW 18.0 (H) 11/08/2022 1420   LYMPHSABS 0.9 11/08/2022 1420   MONOABS 0.3 11/08/2022 1420   EOSABS 0.3 11/08/2022 1420   BASOSABS 0.0 11/08/2022 1420    CMP     Component Value Date/Time   NA 137 10/25/2022 1341   K 3.5 10/25/2022 1341   CL 101 10/25/2022 1341   CO2 27 10/25/2022 1341   GLUCOSE  119 (H) 10/25/2022 1341   BUN 13 10/25/2022 1341   CREATININE 0.78 10/25/2022 1341   CALCIUM 8.7 (L) 10/25/2022 1341   PROT 7.2 10/25/2022 1341   ALBUMIN 3.4 (L) 10/25/2022 1341   AST 51 (H) 10/25/2022 1341   ALT 33 10/25/2022 1341   ALKPHOS 167 (H) 10/25/2022 1341   BILITOT 0.7 10/25/2022 1341   GFRNONAA >60 10/25/2022 1341   GFRAA >60 02/24/2020 1454   GFRAA >60 07/24/2019 1453   ASSESSMENT and THERAPY PLAN:   LATEDRA STEERMAN is a 62 y.o. female who presents to the clinic for follow up for metastatic breast cancer to the bone.    #Metastatic breast cancer involving the bone:   S/P first line Palbociclib with AI. We started her on Faslodex while we were awaiting molecular testing. She was on Ibrance for almost 2 yrs. We have suspected that there could be some progression ongoing given her rising tumor markers and since she has been on this combination for almost 2+ years. Biopsy confirmed metastatic breast cancer ER, PR positive, Her 2 neg. 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 She is  now on orserdu, she had interruptions to treatment, took about a month, then had some hospitalization, treatment was also interrupted by delay in obtaining medication. Hence we agreed to try it for longer. She has her next set of imaging in May. Most recent non contrast imaging with likely stable disease. We started her on on surgery due in February 2024.  She appears to have progression on the most recent imaging.  Given her AKT  mutation, I have today discussed about capivasertib with Faslodex versus Xeloda. I have discussed today the mechanism of action, adverse effects including but not limited to fatigue,  She had grade 1-2 nausea vomiting diarrhea with Capevasartib 320 mg p.o. twice daily hence have asked her to cut down the dose to 320 in the  morning and 160 in the evening starting next week with ongoing monitoring of fasting blood glucose.  She is okay with this plan.  Will also try to arrange for IV fluids today along with Faslodex injection. Psoriasis. Using kenalog for now, remarkably improved skin rash.  RTC in 2 weeks.  Total encounter time 30 minutes*in face-to-face visit time, chart review, lab review, care coordination, order entry, and documentation of the encounter time.  *Total Encounter Time as defined by the Centers for Medicare and Medicaid Services includes, in addition to the face-to-face time of a patient visit (documented in the note above) non-face-to-face time: obtaining and reviewing outside history, ordering and reviewing medications, tests or procedures, care coordination (communications with other health care professionals or caregivers) and documentation in the medical record.

## 2022-11-08 NOTE — Patient Instructions (Signed)
Dehydration, Adult Dehydration is a condition in which there is not enough water or other fluids in the body. This happens when a person loses more fluids than they take in. Important organs, such as the kidneys, brain, and heart, cannot function without a proper amount of fluids. Any loss of fluids from the body can lead to dehydration. Dehydration can be mild, moderate, or severe. It should be treated right away to prevent it from becoming severe. What are the causes? Dehydration may be caused by: Health conditions, such as diarrhea, vomiting, fever, infection, or sweating or urinating a lot. Not drinking enough fluids. Certain medicines, such as medicines that remove excess fluid from the body (diuretics). Lack of safe drinking water. Not being able to get enough water and food. What increases the risk? The following factors may make you more likely to develop this condition: Having a long-term (chronic) illness that has not been treated properly, such as diabetes, heart disease, or kidney disease. Being 64 years of age or older. Having a disability. Living in a place that is high in altitude, where thinner, drier air causes more fluid loss. Doing exercises that put stress on your body for a long time (endurance sports). Being active in a hot climate. What are the signs or symptoms? Symptoms of dehydration depend on how severe it is. Mild or moderate dehydration Thirst. Dry lips or dry mouth. Dizziness or light-headedness. Muscle cramps. Dark urine. Urine may be the color of tea. Less urine or tears produced than usual. Headache. Severe dehydration Changes in skin. Your skin may be cold and clammy, blotchy, or pale. Your skin also may not return to normal after being lightly pinched and released. Little or no tears, urine, or sweat. Rapid breathing and low blood pressure. Your pulse may be weak or may be faster than 100 beats per minute when you are sitting still. Other changes,  such as: Feeling very thirsty. Sunken eyes. Cold hands and feet. Confusion. Being very tired (lethargic) or having trouble waking from sleep. Short-term weight loss. Loss of consciousness. How is this diagnosed? This condition is diagnosed based on your symptoms and a physical exam. You may have blood and urine tests to help confirm the diagnosis. How is this treated? Treatment for this condition depends on how severe it is. Treatment should be started right away. Do not wait until dehydration becomes severe. Severe dehydration is an emergency and needs to be treated in a hospital. Mild or moderate dehydration can be treated at home. You may be asked to: Drink more fluids. Drink an oral rehydration solution (ORS). This drink restores fluids, salts, and minerals in the blood (electrolytes). Stop any activities that caused dehydration, such as exercise. Cool off with cool compresses, cool mist, or cool fluids, if heat or too much sweat caused your condition. Take medicine to treat fever, if fever caused your condition. Take medicine to treat nausea and diarrhea, if vomiting or diarrhea caused your condition. Severe dehydration can be treated: With IV fluids. By correcting abnormal levels of electrolytes in your body. By treating the underlying cause of dehydration. Follow these instructions at home: Oral rehydration solution If told by your health care provider, drink an ORS: Make an ORS by following instructions on the package. Start by drinking small amounts, about  cup (120 mL) every 5-10 minutes. Slowly increase how much you drink until you have taken the amount recommended by your health care provider.  Eating and drinking  Drink enough clear fluid to keep  your urine pale yellow. If you were told to drink an ORS, finish the ORS first and then start slowly drinking other clear fluids. Drink fluids such as: Water. Do not drink only water. Doing that can lead to hyponatremia, which  is having too little salt (sodium) in the body. Water from ice chips you suck on. Diluted fruit juice. This is fruit juice that you have added water to. Low-calorie sports drinks. Eat foods that contain a healthy balance of electrolytes, such as bananas, oranges, potatoes, tomatoes, and spinach. Do not drink alcohol. Avoid the following: Drinks that contain a lot of sugar. These include high-calorie sports drinks, fruit juice that is not diluted, and soda. Caffeine. Foods that are greasy or contain a lot of fat or sugar. General instructions Take over-the-counter and prescription medicines only as told by your health care provider. Do not take sodium tablets. Doing that can lead to having too much sodium in the body (hypernatremia). Return to your normal activities as told by your health care provider. Ask your health care provider what activities are safe for you. Keep all follow-up visits. Your health care provider may need to check your progress and suggest new ways to treat your condition. Contact a health care provider if: You have muscle cramps, pain, or discomfort, such as: Pain in your abdomen and the pain gets worse or stays in one area. Stiff neck. You have a rash. You are more irritable than usual. You are sleepier or have a harder time waking. You feel weak or dizzy. You feel very thirsty. Get help right away if: You have symptoms of severe dehydration. You vomit every time you eat or drink. Your vomiting gets worse, does not go away, or includes blood or green matter (bile). You are getting treatment but symptoms are getting worse. You have a fever. You have a severe headache. You have: Diarrhea that gets worse or does not go away. Blood in your stool. This may cause stool to look black and tarry. Not urinating, or urinating only a small amount of very dark urine, within 6-8 hours. You have trouble breathing. These symptoms may be an emergency. Get help right  away. Do not wait to see if the symptoms will go away. Do not drive yourself to the hospital. Call 911. This information is not intended to replace advice given to you by your health care provider. Make sure you discuss any questions you have with your health care provider. Document Revised: 12/11/2021 Document Reviewed: 12/11/2021 Elsevier Patient Education  2024 [employer]  Fulvestrant Injection What is this medication? FULVESTRANT (ful VES trant) treats breast cancer. It works by blocking the hormone estrogen in breast tissue, which prevents breast cancer cells from spreading or growing. This medicine may be used for other purposes; ask your health care provider or pharmacist if you have questions. COMMON BRAND NAME(S): FASLODEX What should I tell my care team before I take this medication? They need to know if you have any of these conditions: Bleeding disorder Liver disease Low blood cell levels, such as low white cells, red cells, and platelets An unusual or allergic reaction to fulvestrant, other medications, foods, dyes, or preservatives Pregnant or trying to get pregnant Breast-feeding How should I use this medication? This medication is injected into a muscle. It is given by your care team in a hospital or clinic setting. Talk to your care team about the use of this medication in children. Special care may be needed. Overdosage: If you think  you have taken too much of this medicine contact a poison control center or emergency room at once. NOTE: This medicine is only for you. Do not share this medicine with others. What if I miss a dose? Keep appointments for follow-up doses. It is important not to miss your dose. Call your care team if you are unable to keep an appointment. What may interact with this medication? Certain medications that prevent or treat blood clots, such as warfarin, enoxaparin, dalteparin, apixaban, dabigatran, rivaroxaban This list may not describe all  possible interactions. Give your health care provider a list of all the medicines, herbs, non-prescription drugs, or dietary supplements you use. Also tell them if you smoke, drink alcohol, or use illegal drugs. Some items may interact with your medicine. What should I watch for while using this medication? Your condition will be monitored carefully while you are receiving this medication. You may need blood work while taking this medication. Talk to your care team if you may be pregnant. Serious birth defects can occur if you take this medication during pregnancy and for 1 year after the last dose. You will need a negative pregnancy test before starting this medication. Contraception is recommended while taking this medication and for 1 year after the last dose. Your care team can help you find the option that works for you. Do not breastfeed while taking this medication and for 1 year after the last dose. This medication may cause infertility. Talk to your care team if you are concerned about your fertility. What side effects may I notice from receiving this medication? Side effects that you should report to your care team as soon as possible: Allergic reactions or angioedema--skin rash, itching or hives, swelling of the face, eyes, lips, tongue, arms, or legs, trouble swallowing or breathing Pain, tingling, or numbness in the hands or feet Side effects that usually do not require medical attention (report to your care team if they continue or are bothersome): Bone, joint, or muscle pain Constipation Headache Hot flashes Nausea Pain, redness, or irritation at injection site Unusual weakness or fatigue This list may not describe all possible side effects. Call your doctor for medical advice about side effects. You may report side effects to FDA at 1-800-FDA-1088. Where should I keep my medication? This medication is given in a hospital or clinic. It will not be stored at home. NOTE: This sheet  is a summary. It may not cover all possible information. If you have questions about this medicine, talk to your doctor, pharmacist, or health care provider.  2024 Elsevier/Gold Standard (2021-09-26 00:00:00)

## 2022-11-09 ENCOUNTER — Telehealth: Payer: Self-pay

## 2022-11-09 DIAGNOSIS — C50811 Malignant neoplasm of overlapping sites of right female breast: Secondary | ICD-10-CM | POA: Diagnosis not present

## 2022-11-09 LAB — CANCER ANTIGEN 27.29: CA 27.29: 2043.5 U/mL — ABNORMAL HIGH (ref 0.0–38.6)

## 2022-11-09 NOTE — Telephone Encounter (Signed)
Returned daughter's call regarding blood glucose of 57. [name] reports that Pt had 1 episode of diarrhea yesterday, 1 episode of emesis overnight, and 1 episode of diarrhea this AM. Per [name], Pt took her fasting blood glucose as usual. Pt denies AMS, dizziness, sweating, tachycardia. Per Lisette Grinder, RPH the hypoglycemia is unrelated to truqap. Instructed Pt to continue to hold truqap and metformin until she speaks with Beryle Lathe, [facility] on Monday. Also instructed [name] to give Pt candy or a sugary drink such as fruit juice and recheck BG in an hour. [name] Sheehan to give Pt zofran scheduled and use imodium as needed. Encouraged [name] to seek emergency care if patient's blood glucose is unresponsive to sugar consumption, if she is unable to drink fluids, or becomes diaphoretic/weak/tachycardic. [name] verbalized understanding.

## 2022-11-12 ENCOUNTER — Other Ambulatory Visit (HOSPITAL_COMMUNITY): Payer: Self-pay

## 2022-11-12 ENCOUNTER — Telehealth: Payer: Self-pay | Admitting: Hematology and Oncology

## 2022-11-12 NOTE — Telephone Encounter (Signed)
Contacted patient to scheduled appointments. Patient is aware of appointments that are scheduled.   

## 2022-11-13 ENCOUNTER — Other Ambulatory Visit (HOSPITAL_COMMUNITY): Payer: Self-pay

## 2022-11-13 DIAGNOSIS — C50811 Malignant neoplasm of overlapping sites of right female breast: Secondary | ICD-10-CM | POA: Diagnosis not present

## 2022-11-14 ENCOUNTER — Ambulatory Visit: Payer: Medicaid Other | Admitting: Nurse Practitioner

## 2022-11-14 DIAGNOSIS — C50811 Malignant neoplasm of overlapping sites of right female breast: Secondary | ICD-10-CM | POA: Diagnosis not present

## 2022-11-15 ENCOUNTER — Ambulatory Visit: Payer: Medicaid Other | Admitting: Hematology and Oncology

## 2022-11-15 ENCOUNTER — Other Ambulatory Visit: Payer: Medicaid Other

## 2022-11-15 DIAGNOSIS — C50811 Malignant neoplasm of overlapping sites of right female breast: Secondary | ICD-10-CM | POA: Diagnosis not present

## 2022-11-16 DIAGNOSIS — C50811 Malignant neoplasm of overlapping sites of right female breast: Secondary | ICD-10-CM | POA: Diagnosis not present

## 2022-11-19 DIAGNOSIS — C50811 Malignant neoplasm of overlapping sites of right female breast: Secondary | ICD-10-CM | POA: Diagnosis not present

## 2022-11-20 ENCOUNTER — Encounter: Payer: Medicaid Other | Admitting: Rheumatology

## 2022-11-20 DIAGNOSIS — C50811 Malignant neoplasm of overlapping sites of right female breast: Secondary | ICD-10-CM | POA: Diagnosis not present

## 2022-11-21 ENCOUNTER — Other Ambulatory Visit: Payer: Self-pay

## 2022-11-21 ENCOUNTER — Other Ambulatory Visit (HOSPITAL_COMMUNITY): Payer: Self-pay

## 2022-11-21 DIAGNOSIS — C50811 Malignant neoplasm of overlapping sites of right female breast: Secondary | ICD-10-CM | POA: Diagnosis not present

## 2022-11-22 ENCOUNTER — Other Ambulatory Visit: Payer: Self-pay

## 2022-11-22 ENCOUNTER — Other Ambulatory Visit: Payer: Self-pay | Admitting: Hematology and Oncology

## 2022-11-22 ENCOUNTER — Inpatient Hospital Stay (HOSPITAL_BASED_OUTPATIENT_CLINIC_OR_DEPARTMENT_OTHER): Payer: Medicaid Other | Admitting: Hematology and Oncology

## 2022-11-22 ENCOUNTER — Inpatient Hospital Stay: Payer: Medicaid Other

## 2022-11-22 ENCOUNTER — Other Ambulatory Visit (HOSPITAL_COMMUNITY): Payer: Self-pay

## 2022-11-22 VITALS — BP 119/67 | HR 77 | Temp 97.7°F | Resp 16 | Wt 170.6 lb

## 2022-11-22 DIAGNOSIS — C7951 Secondary malignant neoplasm of bone: Secondary | ICD-10-CM

## 2022-11-22 DIAGNOSIS — Z170 Estrogen receptor positive status [ER+]: Secondary | ICD-10-CM

## 2022-11-22 DIAGNOSIS — Z5111 Encounter for antineoplastic chemotherapy: Secondary | ICD-10-CM | POA: Diagnosis not present

## 2022-11-22 LAB — CBC WITH DIFFERENTIAL (CANCER CENTER ONLY)
Abs Immature Granulocytes: 0.01 10*3/uL (ref 0.00–0.07)
Basophils Absolute: 0.1 10*3/uL (ref 0.0–0.1)
Basophils Relative: 2 %
Eosinophils Absolute: 0.5 10*3/uL (ref 0.0–0.5)
Eosinophils Relative: 10 %
HCT: 32.6 % — ABNORMAL LOW (ref 36.0–46.0)
Hemoglobin: 9.6 g/dL — ABNORMAL LOW (ref 12.0–15.0)
Immature Granulocytes: 0 %
Lymphocytes Relative: 23 %
Lymphs Abs: 1.2 10*3/uL (ref 0.7–4.0)
MCH: 25.7 pg — ABNORMAL LOW (ref 26.0–34.0)
MCHC: 29.4 g/dL — ABNORMAL LOW (ref 30.0–36.0)
MCV: 87.2 fL (ref 80.0–100.0)
Monocytes Absolute: 0.3 10*3/uL (ref 0.1–1.0)
Monocytes Relative: 6 %
Neutro Abs: 3.2 10*3/uL (ref 1.7–7.7)
Neutrophils Relative %: 59 %
Platelet Count: 244 10*3/uL (ref 150–400)
RBC: 3.74 MIL/uL — ABNORMAL LOW (ref 3.87–5.11)
RDW: 18.3 % — ABNORMAL HIGH (ref 11.5–15.5)
WBC Count: 5.3 10*3/uL (ref 4.0–10.5)
nRBC: 0 % (ref 0.0–0.2)

## 2022-11-22 LAB — CMP (CANCER CENTER ONLY)
ALT: 12 U/L (ref 0–44)
AST: 24 U/L (ref 15–41)
Albumin: 3.3 g/dL — ABNORMAL LOW (ref 3.5–5.0)
Alkaline Phosphatase: 154 U/L — ABNORMAL HIGH (ref 38–126)
Anion gap: 5 (ref 5–15)
BUN: 22 mg/dL (ref 8–23)
CO2: 32 mmol/L (ref 22–32)
Calcium: 8.6 mg/dL — ABNORMAL LOW (ref 8.9–10.3)
Chloride: 106 mmol/L (ref 98–111)
Creatinine: 1.03 mg/dL — ABNORMAL HIGH (ref 0.44–1.00)
GFR, Estimated: >60 mL/min (ref >60)
Glucose, Bld: 111 mg/dL — ABNORMAL HIGH (ref 70–99)
Potassium: 3.7 mmol/L (ref 3.5–5.1)
Sodium: 143 mmol/L (ref 135–145)
Total Bilirubin: 0.5 mg/dL (ref 0.3–1.2)
Total Protein: 6.5 g/dL (ref 6.5–8.1)

## 2022-11-22 LAB — VITAMIN B12: Vitamin B-12: 291 pg/mL (ref 180–914)

## 2022-11-22 MED ORDER — FULVESTRANT 250 MG/5ML IM SOSY
500.0000 mg | PREFILLED_SYRINGE | Freq: Once | INTRAMUSCULAR | Status: AC
  Administered 2022-11-22: 500 mg via INTRAMUSCULAR
  Filled 2022-11-22: qty 10

## 2022-11-22 MED ORDER — DENOSUMAB 120 MG/1.7ML ~~LOC~~ SOLN
120.0000 mg | Freq: Once | SUBCUTANEOUS | Status: AC
  Administered 2022-11-22: 120 mg via SUBCUTANEOUS
  Filled 2022-11-22: qty 1.7

## 2022-11-22 NOTE — Progress Notes (Signed)
[facility] Cancer Follow up:   Suzanne Rigg, NP [address] [place] [address] [place] [address]  DIAGNOSIS: Metastatic breast cancer  SUMMARY OF ONCOLOGIC HISTORY:  [name] presenting 10/26/2018 with right-sided inflammatory breast cancer, stage IV, involving lungs, lymph nodes and bones, as follows:             (a) chest CT scan 10/26/2018 shows bilateral pleural effusions, possible lymphangitic spread of tumor, diffuse bony metastatic disease, and significant axillary mediastinal and hilar adenopathy             (b) bone scan 10/27/2018 is a "near SuperScan" consistent with widespread bony metastatic disease             (c) head CT with and without contrast 10/30/2018 shows no intracranial metastatic disease, multiple calvarial lesions             (d) CA-27-29 on 10/27/2018 was 1810.4   (1) pleural fluid from right thoracentesis 10/28/2018 confirms malignant cells consistent with a breast primary, strongly estrogen and progesterone receptor positive, HER-2 not amplified, with an MIB-1 of 2%   (2) anastrozole started 10/29/2018             (a) palbociclib started 11/13/2018 at 100 mg dose             (b) denosumab/xgeva started 11/13/2018              (C) palbociclib dose decreased to 75mg              (D) Palbociclib dose decreased to 75 mg every other day in 11/2021   (3) associated problems:             (a) hypoxia secondary to effusions             (b) pain from bone lesions and left humeral and vertebral compression fractures             (c) right upper extremity lymphedema             (d) poor venous access   (4) genetics testing on 12/25/2018 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 STK11, TP53, TSC1, TSC2, and VHL.  The following genes were evaluated for sequence changes only: SDHA and HOXB13 c.251G>A variant only. The [facility] Melanoma Panel analyzed the following 9 genes: BAP1 BRCA2 CDK4 CDKN2A MITF POT1 PTEN RB1 Tp53.    (5) palliative radiation to the left humerus 07/20/2019 - 07/31/2019 Site Technique Total Dose (Gy) Dose per Fx (Gy) Completed Fx Beam Energies  Humerus, Left: Ext_Lt_humerus Complex 30/30 3 10/10 6X    (6) anemia requiring transfusion:              (a) B-12 level 134 FEB 2022: B-12 injectons started              (B) iron deficient, IV iron to start 2/6  (C) Hospitalized with upper GI bleed 05/27/2022-05/30/2022  (7) Elacestrant started 04/30/2022, progression noted on imaging from May  2024  INTERVAL HISTORY:  Suzanne [name] 62 y.o. female returns for follow-up.   She is now on Truqap with Faslodex. She denies any more hyperglycemic episodes or diarrhea. She is feeling quite well, eating well, more active at home.  She didn't have to take any metformin. No change in urinary habits No new neurological complaints Rest of the pertinent 10 point ROS reviewed and neg.   Patient Active Problem List   Diagnosis Date Noted   UGI bleed 05/27/2022   Acute prerenal azotemia 04/18/2022   Dehydration 04/18/2022   SIRS (systemic inflammatory response syndrome) (HCC) 04/18/2022   Allergic rhinitis 04/18/2022   Depression 04/18/2022   Anemia of chronic disease 04/18/2022   Bronchitis 04/18/2022   Acute bronchitis 04/17/2022   Intertrigo 01/10/2022   Bacterial vaginitis 01/10/2022   Sepsis due to cellulitis (HCC) 12/06/2021   B12 deficiency anemia 08/15/2020   Sensorineural hearing loss  (SNHL) of both ears 08/07/2019   Intractable back pain 02/03/2019   Thoracic compression fracture, closed, initial encounter (HCC) 02/03/2019   Essential hypertension 02/03/2019   Genetic testing 01/27/2019   Acute pharyngitis    Bacteremia    FTT (failure to thrive) in adult    Fracture closed, humerus, shaft 01/09/2019   Pneumonia 12/25/2018   Acute respiratory disorder in immunocompromised patient (HCC) 12/25/2018   Family history of breast cancer    Family history of prostate cancer    Family history of melanoma    Family history of colon cancer    Pressure injury of skin 11/24/2018   Chronic respiratory failure with hypoxia and hypercapnia (HCC) 11/19/2018   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   Hypophosphatemia 11/19/2018   Hypocalcemia 11/19/2018   Aspiration pneumonia (HCC) 11/19/2018   SOB (shortness of breath)    Palliative care by specialist    Carcinoma of breast, estrogen receptor positive, stage 4, right (HCC) 11/16/2018   Hypoalbuminemia 11/16/2018   Pleural effusion 11/14/2018   Goals of care, counseling/discussion 11/06/2018   Malignant neoplasm of overlapping sites of right breast in female, estrogen receptor positive (HCC) 10/31/2018   Lung metastasis 10/31/2018   Pain from bone metastases (HCC) 10/31/2018   Class 2 obesity 10/31/2018   Bone injury    Pleural effusion, bilateral    Shortness of breath    Abnormal breast finding    Hypokalemia    Breast skin changes    Macrocytic anemia    Malignant neoplasm metastatic to bone (HCC)    Anemia, B12 deficiency 10/26/2018    is allergic to ferumoxytol.  MEDICAL HISTORY: Past Medical History:  Diagnosis Date   Anemia    Cancer (HCC)    Family history of breast cancer    Family history of colon cancer    Family history of melanoma    Family history of prostate cancer    Gallstones    History of hiatal hernia    History of kidney stones    Mastitis    reports history of recurrent mastitis    Metastatic breast cancer    Obesity    Psoriasis     SURGICAL HISTORY: Past Surgical History:  Procedure Laterality Date   CYSTOSCOPY/URETEROSCOPY/HOLMIUM LASER/STENT PLACEMENT Left 10/17/2022   Procedure: CYSTOSCOPY, LEFT RETROGRADE PYELOGRAM, LEFT URETEROSCOPY, HOLMIUM LASER LITHOTRIPSY, BASKET EXTRACTION, AND LEFT URETERAL STENT PLACEMENT;  Surgeon: Rene Paci, MD;  Location: [facility];  Service: Urology;  Laterality: Left;  45 MINUTES   ESOPHAGOGASTRODUODENOSCOPY (EGD) WITH PROPOFOL N/A 05/27/2022   Procedure: ESOPHAGOGASTRODUODENOSCOPY (EGD) WITH  PROPOFOL;  Surgeon: Willis Modena, MD;  Location: [facility];  Service: Gastroenterology;  Laterality: N/A;   EYE SURGERY     cataract   GLAUCOMA SURGERY  late 1990's   IR THORACENTESIS ASP PLEURAL SPACE W/IMG GUIDE  10/28/2018   IR THORACENTESIS ASP PLEURAL SPACE W/IMG GUIDE  10/29/2018   OPEN REDUCTION INTERNAL FIXATION (ORIF) DISTAL RADIAL FRACTURE Left 01/09/2019   Procedure: OPEN REDUCTION INTERNAL FIXATION (ORIF) HUMERAL FRACTURE;  Surgeon: Beverely Low, MD;  Location: [facility];  Service: Orthopedics;  Laterality: Left;    SOCIAL HISTORY: Social History   Socioeconomic History   Marital status: Widowed    Spouse name: Not on file   Number of children: 1   Years of education: Not on file   Highest education level: Not on file  Occupational History   Occupation: Visual merchandiser Programs    Comment: Self-employed  Tobacco Use   Smoking status: Never   Smokeless tobacco: Never   Tobacco comments:    second hand smoke exposure  Vaping Use   Vaping Use: Never used  Substance and Sexual Activity   Alcohol use: No   Drug use: No   Sexual activity: Not Currently  Other Topics Concern   Not on file  Social History Narrative   Has a daughter named [name]. Daughter has CP.   Social Determinants of Health   Financial Resource Strain: Low Risk  (09/17/2022)   Overall Financial Resource Strain (CARDIA)     Difficulty of Paying Living Expenses: Not hard at all  Food Insecurity: No Food Insecurity (09/17/2022)   Hunger Vital Sign    Worried About Running Out of Food in the Last Year: Never true    Ran Out of Food in the Last Year: Never true  Transportation Needs: No Transportation Needs (09/17/2022)   PRAPARE - Administrator, Civil Service (Medical): No    Lack of Transportation (Non-Medical): No  Physical Activity: Not on file  Stress: Stress Concern Present (11/08/2021)   [employer] of Occupational Health - Occupational Stress Questionnaire    Feeling of Stress : To some extent  Social Connections: Socially Isolated (03/27/2021)   Social Connection and Isolation Panel [NHANES]    Frequency of Communication with Friends and Family: Once a week    Frequency of Social Gatherings with Friends and Family: More than three times a week    Attends Religious Services: Never    Database administrator or Organizations: No    Attends Banker Meetings: Never    Marital Status: Widowed  Intimate Partner Violence: Not At Risk (09/17/2022)   Humiliation, Afraid, Rape, and Kick questionnaire    Fear of Current or Ex-Partner: No    Emotionally Abused: No    Physically Abused: No    Sexually Abused: No    FAMILY HISTORY: Family History  Problem Relation Age of Onset   Breast cancer Sister        in her 43's-70s   Heart disease Brother        CABG   Heart disease Brother        CABG   Skin cancer Brother        melanoma   Colon cancer Cousin    Heart attack Mother    Heart attack Father    Prostate cancer Brother     Review of Systems  Constitutional:  Negative for appetite change, chills, fatigue, fever and unexpected weight change.  HENT:   Negative for  hearing loss, lump/mass and trouble swallowing.   Eyes:  Negative for eye problems and icterus.  Respiratory:  Negative for chest tightness, cough and shortness of breath (at baseline).   Cardiovascular:   Negative for chest pain, leg swelling and palpitations.  Gastrointestinal:  Negative for abdominal distention, abdominal pain, constipation, diarrhea, nausea and vomiting.  Endocrine: Negative for hot flashes.  Genitourinary:  Negative for difficulty urinating.   Musculoskeletal:  Negative for arthralgias.  Skin:  Negative for itching and rash.  Neurological:  Negative for dizziness, extremity weakness, headaches and numbness.  Hematological:  Negative for adenopathy. Does not bruise/bleed easily.  Psychiatric/Behavioral:  Negative for depression. The patient is not nervous/anxious.       PHYSICAL EXAMINATION  ECOG PERFORMANCE STATUS: 2 - Symptomatic, <50% confined to bed  Vitals:   11/22/22 1217  BP: 119/67  Pulse: 77  Resp: 16  Temp: 97.7 F (36.5 C)  SpO2: 99%     She appears well today, in no acute distress Neck: No palpable adenopathy Chest clear to auscultation bilaterally Heart: Regular rate and rhythm regular,  No lower extremity edema  LABORATORY DATA:  CBC    Component Value Date/Time   WBC 5.3 11/22/2022 1154   WBC 5.1 10/16/2022 1301   RBC 3.74 (L) 11/22/2022 1154   HGB 9.6 (L) 11/22/2022 1154   HCT 32.6 (L) 11/22/2022 1154   PLT 244 11/22/2022 1154   MCV 87.2 11/22/2022 1154   MCH 25.7 (L) 11/22/2022 1154   MCHC 29.4 (L) 11/22/2022 1154   RDW 18.3 (H) 11/22/2022 1154   LYMPHSABS 1.2 11/22/2022 1154   MONOABS 0.3 11/22/2022 1154   EOSABS 0.5 11/22/2022 1154   BASOSABS 0.1 11/22/2022 1154    CMP     Component Value Date/Time   NA 140 11/08/2022 1420   K 3.6 11/08/2022 1420   CL 107 11/08/2022 1420   CO2 25 11/08/2022 1420   GLUCOSE 122 (H) 11/08/2022 1420   BUN 19 11/08/2022 1420   CREATININE 1.08 (H) 11/08/2022 1420   CALCIUM 8.1 (L) 11/08/2022 1420   PROT 7.2 11/08/2022 1420   ALBUMIN 3.4 (L) 11/08/2022 1420   AST 32 11/08/2022 1420   ALT 16 11/08/2022 1420   ALKPHOS 191 (H) 11/08/2022 1420   BILITOT 0.4 11/08/2022 1420   GFRNONAA 58  (L) 11/08/2022 1420   GFRAA >60 02/24/2020 1454   GFRAA >60 07/24/2019 1453   ASSESSMENT and THERAPY PLAN:   Suzanne [name] is a 62 y.o. female who presents to the clinic for follow up for metastatic breast cancer to the bone.    #Metastatic breast cancer involving the bone:   S/P first line Palbociclib with AI. We started her on Faslodex while we were awaiting molecular testing. She was on Ibrance for almost 2 yrs. We have suspected that there could be some progression ongoing given her rising tumor markers and since she has been on this combination for almost 2+ years. Biopsy confirmed metastatic breast cancer ER, PR positive, Her 2 neg. 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 She is now on orserdu, she had interruptions to treatment, took about a month, then had some hospitalization, treatment was also interrupted by delay in obtaining medication. Hence we agreed to try it for longer. She has her next set of imaging in May. Most recent non contrast imaging with likely stable disease. We  started her on on surgery due in February 2024.  She appears to have progression on the most recent imaging.  Given her AKT  mutation, I have today discussed about capivasertib with Faslodex versus Xeloda. I have discussed today the mechanism of action, adverse effects including but not limited to fatigue,  She had grade 1-2 nausea vomiting diarrhea with Capevasartib 320 mg p.o. twice daily hence have asked her to cut down the dose to 320 in the morning and 160 in the evening starting next week with ongoing monitoring of fasting blood glucose.  This dose has been working very well for her.  She denies any more diarrhea or hypoglycemic episodes.  She had a log of all her fasting blood glucose levels.  She will continue current dose for the next 2 months and  we will repeat imaging again.  Will continue to monitor her tumor marker in the interim.  She is due for her third loading dose of Faslodex and after today she should get it every 28 days.  RTC in 4 weeks.  Total encounter time 30 minutes*in face-to-face visit time, chart review, lab review, care coordination, order entry, and documentation of the encounter time.  *Total Encounter Time as defined by the Centers for Medicare and Medicaid Services includes, in addition to the face-to-face time of a patient visit (documented in the note above) non-face-to-face time: obtaining and reviewing outside history, ordering and reviewing medications, tests or procedures, care coordination (communications with other health care professionals or caregivers) and documentation in the medical record.

## 2022-11-23 ENCOUNTER — Other Ambulatory Visit: Payer: Self-pay

## 2022-11-23 ENCOUNTER — Other Ambulatory Visit: Payer: Self-pay | Admitting: Hematology and Oncology

## 2022-11-23 ENCOUNTER — Other Ambulatory Visit (HOSPITAL_COMMUNITY): Payer: Self-pay

## 2022-11-23 DIAGNOSIS — C50811 Malignant neoplasm of overlapping sites of right female breast: Secondary | ICD-10-CM | POA: Diagnosis not present

## 2022-11-23 DIAGNOSIS — C7951 Secondary malignant neoplasm of bone: Secondary | ICD-10-CM

## 2022-11-23 MED ORDER — CAPIVASERTIB 160 MG PO TABS
ORAL_TABLET | ORAL | 1 refills | Status: DC
  Filled 2022-11-23: qty 64, 34d supply, fill #0
  Filled 2022-12-21: qty 64, 34d supply, fill #1

## 2022-11-24 LAB — CANCER ANTIGEN 27.29: CA 27.29: 963.8 U/mL — ABNORMAL HIGH (ref 0.0–38.6)

## 2022-11-26 ENCOUNTER — Other Ambulatory Visit (HOSPITAL_COMMUNITY): Payer: Self-pay

## 2022-11-26 DIAGNOSIS — C7801 Secondary malignant neoplasm of right lung: Secondary | ICD-10-CM | POA: Diagnosis not present

## 2022-11-26 DIAGNOSIS — N19 Unspecified kidney failure: Secondary | ICD-10-CM | POA: Diagnosis not present

## 2022-11-26 DIAGNOSIS — J40 Bronchitis, not specified as acute or chronic: Secondary | ICD-10-CM | POA: Diagnosis not present

## 2022-11-26 DIAGNOSIS — S22000A Wedge compression fracture of unspecified thoracic vertebra, initial encounter for closed fracture: Secondary | ICD-10-CM | POA: Diagnosis not present

## 2022-11-26 DIAGNOSIS — R6510 Systemic inflammatory response syndrome (SIRS) of non-infectious origin without acute organ dysfunction: Secondary | ICD-10-CM | POA: Diagnosis not present

## 2022-11-26 DIAGNOSIS — J309 Allergic rhinitis, unspecified: Secondary | ICD-10-CM | POA: Diagnosis not present

## 2022-11-26 DIAGNOSIS — R0602 Shortness of breath: Secondary | ICD-10-CM | POA: Diagnosis not present

## 2022-11-26 DIAGNOSIS — J9621 Acute and chronic respiratory failure with hypoxia: Secondary | ICD-10-CM | POA: Diagnosis not present

## 2022-11-26 DIAGNOSIS — C50811 Malignant neoplasm of overlapping sites of right female breast: Secondary | ICD-10-CM | POA: Diagnosis not present

## 2022-11-27 ENCOUNTER — Other Ambulatory Visit: Payer: Self-pay

## 2022-11-27 DIAGNOSIS — C50811 Malignant neoplasm of overlapping sites of right female breast: Secondary | ICD-10-CM | POA: Diagnosis not present

## 2022-11-28 DIAGNOSIS — C50811 Malignant neoplasm of overlapping sites of right female breast: Secondary | ICD-10-CM | POA: Diagnosis not present

## 2022-11-29 DIAGNOSIS — C50811 Malignant neoplasm of overlapping sites of right female breast: Secondary | ICD-10-CM | POA: Diagnosis not present

## 2022-11-30 DIAGNOSIS — C50811 Malignant neoplasm of overlapping sites of right female breast: Secondary | ICD-10-CM | POA: Diagnosis not present

## 2022-12-03 ENCOUNTER — Other Ambulatory Visit (HOSPITAL_COMMUNITY): Payer: Self-pay

## 2022-12-03 DIAGNOSIS — C50811 Malignant neoplasm of overlapping sites of right female breast: Secondary | ICD-10-CM | POA: Diagnosis not present

## 2022-12-04 DIAGNOSIS — C50811 Malignant neoplasm of overlapping sites of right female breast: Secondary | ICD-10-CM | POA: Diagnosis not present

## 2022-12-05 DIAGNOSIS — C50811 Malignant neoplasm of overlapping sites of right female breast: Secondary | ICD-10-CM | POA: Diagnosis not present

## 2022-12-05 DIAGNOSIS — R1084 Generalized abdominal pain: Secondary | ICD-10-CM | POA: Diagnosis not present

## 2022-12-05 DIAGNOSIS — N201 Calculus of ureter: Secondary | ICD-10-CM | POA: Diagnosis not present

## 2022-12-06 DIAGNOSIS — C50811 Malignant neoplasm of overlapping sites of right female breast: Secondary | ICD-10-CM | POA: Diagnosis not present

## 2022-12-07 DIAGNOSIS — C50811 Malignant neoplasm of overlapping sites of right female breast: Secondary | ICD-10-CM | POA: Diagnosis not present

## 2022-12-10 DIAGNOSIS — C50811 Malignant neoplasm of overlapping sites of right female breast: Secondary | ICD-10-CM | POA: Diagnosis not present

## 2022-12-11 DIAGNOSIS — C50811 Malignant neoplasm of overlapping sites of right female breast: Secondary | ICD-10-CM | POA: Diagnosis not present

## 2022-12-12 DIAGNOSIS — C50811 Malignant neoplasm of overlapping sites of right female breast: Secondary | ICD-10-CM | POA: Diagnosis not present

## 2022-12-13 DIAGNOSIS — C50811 Malignant neoplasm of overlapping sites of right female breast: Secondary | ICD-10-CM | POA: Diagnosis not present

## 2022-12-17 DIAGNOSIS — C50811 Malignant neoplasm of overlapping sites of right female breast: Secondary | ICD-10-CM | POA: Diagnosis not present

## 2022-12-18 ENCOUNTER — Ambulatory Visit: Payer: Medicaid Other | Admitting: Rheumatology

## 2022-12-18 DIAGNOSIS — C50811 Malignant neoplasm of overlapping sites of right female breast: Secondary | ICD-10-CM | POA: Diagnosis not present

## 2022-12-19 DIAGNOSIS — C50811 Malignant neoplasm of overlapping sites of right female breast: Secondary | ICD-10-CM | POA: Diagnosis not present

## 2022-12-20 ENCOUNTER — Inpatient Hospital Stay (HOSPITAL_BASED_OUTPATIENT_CLINIC_OR_DEPARTMENT_OTHER): Payer: Medicaid Other | Admitting: Adult Health

## 2022-12-20 ENCOUNTER — Inpatient Hospital Stay: Payer: Medicaid Other | Attending: Adult Health

## 2022-12-20 ENCOUNTER — Encounter: Payer: Self-pay | Admitting: Adult Health

## 2022-12-20 ENCOUNTER — Inpatient Hospital Stay: Payer: Medicaid Other

## 2022-12-20 VITALS — BP 129/75 | HR 90 | Temp 97.7°F | Resp 18 | Ht 59.0 in | Wt 177.5 lb

## 2022-12-20 DIAGNOSIS — Z800 Family history of malignant neoplasm of digestive organs: Secondary | ICD-10-CM | POA: Diagnosis not present

## 2022-12-20 DIAGNOSIS — C7951 Secondary malignant neoplasm of bone: Secondary | ICD-10-CM | POA: Insufficient documentation

## 2022-12-20 DIAGNOSIS — C50811 Malignant neoplasm of overlapping sites of right female breast: Secondary | ICD-10-CM | POA: Diagnosis present

## 2022-12-20 DIAGNOSIS — Z170 Estrogen receptor positive status [ER+]: Secondary | ICD-10-CM | POA: Diagnosis not present

## 2022-12-20 DIAGNOSIS — Z808 Family history of malignant neoplasm of other organs or systems: Secondary | ICD-10-CM | POA: Insufficient documentation

## 2022-12-20 DIAGNOSIS — Z79899 Other long term (current) drug therapy: Secondary | ICD-10-CM | POA: Insufficient documentation

## 2022-12-20 DIAGNOSIS — Z5111 Encounter for antineoplastic chemotherapy: Secondary | ICD-10-CM | POA: Insufficient documentation

## 2022-12-20 DIAGNOSIS — C50911 Malignant neoplasm of unspecified site of right female breast: Secondary | ICD-10-CM | POA: Diagnosis not present

## 2022-12-20 LAB — CBC WITH DIFFERENTIAL (CANCER CENTER ONLY)
Abs Immature Granulocytes: 0.04 10*3/uL (ref 0.00–0.07)
Basophils Absolute: 0 10*3/uL (ref 0.0–0.1)
Basophils Relative: 0 %
Eosinophils Absolute: 0.5 10*3/uL (ref 0.0–0.5)
Eosinophils Relative: 6 %
HCT: 31.5 % — ABNORMAL LOW (ref 36.0–46.0)
Hemoglobin: 9.5 g/dL — ABNORMAL LOW (ref 12.0–15.0)
Immature Granulocytes: 1 %
Lymphocytes Relative: 16 %
Lymphs Abs: 1.1 10*3/uL (ref 0.7–4.0)
MCH: 25.9 pg — ABNORMAL LOW (ref 26.0–34.0)
MCHC: 30.2 g/dL (ref 30.0–36.0)
MCV: 85.8 fL (ref 80.0–100.0)
Monocytes Absolute: 0.4 10*3/uL (ref 0.1–1.0)
Monocytes Relative: 6 %
Neutro Abs: 5 10*3/uL (ref 1.7–7.7)
Neutrophils Relative %: 71 %
Platelet Count: 264 10*3/uL (ref 150–400)
RBC: 3.67 MIL/uL — ABNORMAL LOW (ref 3.87–5.11)
RDW: 18.7 % — ABNORMAL HIGH (ref 11.5–15.5)
WBC Count: 7 10*3/uL (ref 4.0–10.5)
nRBC: 0 % (ref 0.0–0.2)

## 2022-12-20 LAB — CMP (CANCER CENTER ONLY)
ALT: 10 U/L (ref 0–44)
AST: 18 U/L (ref 15–41)
Albumin: 3.5 g/dL (ref 3.5–5.0)
Alkaline Phosphatase: 118 U/L (ref 38–126)
Anion gap: 7 (ref 5–15)
BUN: 15 mg/dL (ref 8–23)
CO2: 29 mmol/L (ref 22–32)
Calcium: 7.9 mg/dL — ABNORMAL LOW (ref 8.9–10.3)
Chloride: 105 mmol/L (ref 98–111)
Creatinine: 1 mg/dL (ref 0.44–1.00)
GFR, Estimated: >60 mL/min (ref >60)
Glucose, Bld: 109 mg/dL — ABNORMAL HIGH (ref 70–99)
Potassium: 3.3 mmol/L — ABNORMAL LOW (ref 3.5–5.1)
Sodium: 141 mmol/L (ref 135–145)
Total Bilirubin: 0.6 mg/dL (ref 0.3–1.2)
Total Protein: 6 g/dL — ABNORMAL LOW (ref 6.5–8.1)

## 2022-12-20 LAB — VITAMIN B12: Vitamin B-12: 288 pg/mL (ref 180–914)

## 2022-12-20 MED ORDER — FULVESTRANT 250 MG/5ML IM SOSY
500.0000 mg | PREFILLED_SYRINGE | Freq: Once | INTRAMUSCULAR | Status: AC
  Administered 2022-12-20: 500 mg via INTRAMUSCULAR
  Filled 2022-12-20: qty 10

## 2022-12-20 MED ORDER — DENOSUMAB 120 MG/1.7ML ~~LOC~~ SOLN: 120.0000 mg | Freq: Once | SUBCUTANEOUS | Status: DC

## 2022-12-20 NOTE — Assessment & Plan Note (Signed)
Suzanne [name] is a 62 year old woman with metastatic breast cancer here today for f/u on treatment with Fulvestrant and Truqap.    She is tolerating Truqap and fulvestrant well.  She will continue on her current treatment.  Her CBC is stable, CMET and CA27-29 pending.  Her most recent CA 27 29 was decreased, indicating a response to treatment.  She will f/u with dermatology about her psoriasis.  I recommended she continue to check her blood sugar 1-2 times a week.    She will return in 4 weeks for labs, f/u, and her next injection.

## 2022-12-20 NOTE — Progress Notes (Signed)
[facility] Cancer Follow up:    Suzanne [name], Suzanne [name] [address] [place] [address] [place] [address]   DIAGNOSIS: Metastatic breast cancer  SUMMARY OF ONCOLOGIC HISTORY: [name] presenting 10/26/2018 with right-sided inflammatory breast cancer, stage IV, involving lungs, lymph nodes and bones, as follows:             (a) chest CT scan 10/26/2018 shows bilateral pleural effusions, possible lymphangitic spread of tumor, diffuse bony metastatic disease, and significant axillary mediastinal and hilar adenopathy             (b) bone scan 10/27/2018 is a "near SuperScan" consistent with widespread bony metastatic disease             (c) head CT with and without contrast 10/30/2018 shows no intracranial metastatic disease, multiple calvarial lesions             (d) CA-27-29 on 10/27/2018 was 1810.4   (1) pleural fluid from right thoracentesis 10/28/2018 confirms malignant cells consistent with a breast primary, strongly estrogen and progesterone receptor positive, HER-2 not amplified, with an MIB-1 of 2%   (2) anastrozole started 10/29/2018             (a) palbociclib started 11/13/2018 at 100 mg dose             (b) denosumab/xgeva started 11/13/2018              (C) palbociclib dose decreased to 75mg              (D) Palbociclib dose decreased to 75 mg every other day in 11/2021   (3) associated problems:             (a) hypoxia secondary to effusions             (b) pain from bone lesions and left humeral and vertebral compression fractures             (c) right upper extremity lymphedema             (d) poor venous access   (4) genetics testing on 12/25/2018 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 STK11, TP53, TSC1, TSC2, and VHL.  The following genes were evaluated for sequence changes only: SDHA and HOXB13 c.251G>A variant only. The [facility] Melanoma Panel analyzed the following 9 genes: BAP1 BRCA2 CDK4 CDKN2A MITF POT1 PTEN RB1 Tp53.    (5) palliative radiation to the left humerus 07/20/2019 - 07/31/2019 Site Technique Total Dose (Gy) Dose per Fx (Gy) Completed Fx Beam Energies  Humerus, Left: Ext_Lt_humerus Complex 30/30 3 10/10 6X    (6) anemia requiring transfusion:              (a) B-12 level 134 FEB 2022: B-12 injectons started              (B) iron deficient, IV iron to start 2/6             (C) Hospitalized with upper GI bleed  05/27/2022-05/30/2022   (7) Elacestrant started 04/30/2022, progression noted on imaging from May 2024       CURRENT THERAPY: Capivasertib, Fulvestrant  INTERVAL HISTORY: Suzanne [name] 62 y.o. female returns for follow-up prior to receiving her fulvestrant injection.  She tells me that she is feeling quite well.  She says that the way she is taking the true She feels like it is working and her cancer is responding.  Brought in her blood sugar log and it has remained less than 120.  She denies any new issues today other than her psoriasis flaring.     Patient Active Problem List   Diagnosis Date Noted   UGI bleed 05/27/2022   Acute prerenal azotemia 04/18/2022   Dehydration 04/18/2022   SIRS (systemic inflammatory response syndrome) (HCC) 04/18/2022   Allergic rhinitis 04/18/2022   Depression 04/18/2022   Anemia of chronic disease 04/18/2022   Bronchitis 04/18/2022   Acute bronchitis 04/17/2022   Intertrigo 01/10/2022   Bacterial vaginitis 01/10/2022   Sepsis due to  cellulitis (HCC) 12/06/2021   B12 deficiency anemia 08/15/2020   Sensorineural hearing loss (SNHL) of both ears 08/07/2019   Intractable back pain 02/03/2019   Thoracic compression fracture, closed, initial encounter (HCC) 02/03/2019   Essential hypertension 02/03/2019   Genetic testing 01/27/2019   Acute pharyngitis    Bacteremia    FTT (failure to thrive) in adult    Fracture closed, humerus, shaft 01/09/2019   Pneumonia 12/25/2018   Acute respiratory disorder in immunocompromised patient (HCC) 12/25/2018   Family history of breast cancer    Family history of prostate cancer    Family history of melanoma    Family history of colon cancer    Pressure injury of skin 11/24/2018   Chronic respiratory failure with hypoxia and hypercapnia (HCC) 11/19/2018   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   Hypophosphatemia 11/19/2018   Hypocalcemia 11/19/2018   Aspiration pneumonia (HCC) 11/19/2018   SOB (shortness of breath)    Palliative care by specialist    Carcinoma of breast, estrogen receptor positive, stage 4, right (HCC) 11/16/2018   Hypoalbuminemia 11/16/2018   Pleural effusion 11/14/2018   Goals of care, counseling/discussion 11/06/2018   Malignant neoplasm of overlapping sites of right breast in female, estrogen receptor positive (HCC) 10/31/2018   Lung metastasis 10/31/2018   Pain from bone metastases (HCC) 10/31/2018   Class 2 obesity 10/31/2018   Bone injury    Pleural effusion, bilateral    Shortness of breath    Abnormal breast finding    Hypokalemia    Breast skin changes    Macrocytic anemia    Malignant neoplasm metastatic to bone (HCC)    Anemia, B12 deficiency 10/26/2018    is allergic to ferumoxytol.  MEDICAL HISTORY: Past Medical History:  Diagnosis Date   Anemia    Cancer (HCC)    Family history of breast cancer    Family history of colon cancer    Family history of melanoma    Family history of prostate cancer    Gallstones    History of hiatal  hernia    History of kidney stones    Mastitis    reports history of recurrent mastitis   Metastatic breast cancer    Obesity    Psoriasis     SURGICAL HISTORY: Past Surgical History:  Procedure Laterality Date   CYSTOSCOPY/URETEROSCOPY/HOLMIUM LASER/STENT PLACEMENT Left 10/17/2022   Procedure: CYSTOSCOPY, LEFT RETROGRADE PYELOGRAM, LEFT URETEROSCOPY, HOLMIUM LASER LITHOTRIPSY, BASKET EXTRACTION, AND LEFT URETERAL STENT PLACEMENT;  Surgeon: Rene Paci, MD;  Location: [facility];  Service: Urology;  Laterality: Left;  45 MINUTES   ESOPHAGOGASTRODUODENOSCOPY (EGD) WITH PROPOFOL N/A 05/27/2022   Procedure: ESOPHAGOGASTRODUODENOSCOPY (EGD) WITH PROPOFOL;  Surgeon: Willis Modena, MD;  Location: [facility];  Service: Gastroenterology;  Laterality: N/A;   EYE SURGERY     cataract   GLAUCOMA SURGERY  late 1990's   IR THORACENTESIS ASP PLEURAL SPACE W/IMG GUIDE  10/28/2018   IR THORACENTESIS ASP PLEURAL SPACE W/IMG GUIDE  10/29/2018   OPEN REDUCTION INTERNAL FIXATION (ORIF) DISTAL RADIAL FRACTURE Left 01/09/2019   Procedure: OPEN REDUCTION INTERNAL FIXATION (ORIF) HUMERAL FRACTURE;  Surgeon: Beverely Low, MD;  Location: [facility];  Service: Orthopedics;  Laterality: Left;    SOCIAL HISTORY: Social History   Socioeconomic History   Marital status: Widowed    Spouse name: Not on file   Number of children: 1   Years of education: Not on file   Highest education level: Not on file  Occupational History   Occupation: Visual merchandiser Programs    Comment: Self-employed  Tobacco Use   Smoking status: Never   Smokeless tobacco: Never   Tobacco comments:    second hand smoke exposure  Vaping Use   Vaping status: Never Used  Substance and Sexual Activity   Alcohol use: No   Drug use: No   Sexual activity: Not Currently  Other Topics Concern   Not on file  Social History Narrative   Has a daughter named [name]. Daughter has CP.   Social Determinants of Health    Financial Resource Strain: Low Risk  (09/17/2022)   Overall Financial Resource Strain (CARDIA)    Difficulty of Paying Living Expenses: Not hard at all  Food Insecurity: No Food Insecurity (09/17/2022)   Hunger Vital Sign    Worried About Running Out of Food in the Last Year: Never true    Ran Out of Food in the Last Year: Never true  Transportation Needs: No Transportation Needs (09/17/2022)   PRAPARE - Administrator, Civil Service (Medical): No    Lack of Transportation (Non-Medical): No  Physical Activity: Not on file  Stress: Stress Concern Present (11/08/2021)   [employer] of Occupational Health - Occupational Stress Questionnaire    Feeling of Stress : To some extent  Social Connections: Socially Isolated (03/27/2021)   Social Connection and Isolation Panel [NHANES]    Frequency of Communication with Friends and Family: Once a week    Frequency of Social Gatherings with Friends and Family: More than three times a week    Attends Religious Services: Never    Database administrator or Organizations: No    Attends Banker Meetings: Never    Marital Status: Widowed  Intimate Partner Violence: Not At Risk (09/17/2022)   Humiliation, Afraid, Rape, and Kick questionnaire    Fear of Current or Ex-Partner: No    Emotionally Abused: No    Physically Abused: No    Sexually Abused: No    FAMILY HISTORY: Family History  Problem Relation Age of Onset   Breast cancer Sister        in her 25's-70s   Heart disease Brother        CABG   Heart disease Brother        CABG   Skin cancer Brother        melanoma   Colon cancer Cousin    Heart attack Mother    Heart attack Father  Prostate cancer Brother     Review of Systems  Constitutional:  Positive for fatigue (mild). Negative for appetite change, chills, fever and unexpected weight change.  HENT:   Negative for hearing loss, lump/mass and trouble swallowing.   Eyes:  Negative for eye problems and  icterus.  Respiratory:  Negative for chest tightness, cough and shortness of breath.   Cardiovascular:  Negative for chest pain, leg swelling and palpitations.  Gastrointestinal:  Negative for abdominal distention, abdominal pain, constipation, diarrhea, nausea and vomiting.  Endocrine: Negative for hot flashes.  Genitourinary:  Negative for difficulty urinating.   Musculoskeletal:  Negative for arthralgias.  Skin:  Negative for itching and rash.  Neurological:  Negative for dizziness, extremity weakness, headaches and numbness.  Hematological:  Negative for adenopathy. Does not bruise/bleed easily.  Psychiatric/Behavioral:  Negative for depression. The patient is not nervous/anxious.       PHYSICAL EXAMINATION   Onc Performance Status - 12/20/22 1408       ECOG Perf Status   ECOG Perf Status Ambulatory and capable of all selfcare but unable to carry out any work activities.  Up and about more than 50% of waking hours      KPS SCALE   KPS % SCORE Cares for self, unable to carry on normal activity or to do active work             Vitals:   12/20/22 1404  BP: 129/75  Pulse: 90  Resp: 18  Temp: 97.7 F (36.5 C)  SpO2: 100%    Physical Exam Constitutional:      General: She is not in acute distress.    Appearance: Normal appearance. She is not toxic-appearing.  HENT:     Head: Normocephalic and atraumatic.     Mouth/Throat:     Mouth: Mucous membranes are moist.     Pharynx: Oropharynx is clear. No oropharyngeal exudate or posterior oropharyngeal erythema.  Eyes:     General: No scleral icterus. Cardiovascular:     Rate and Rhythm: Normal rate and regular rhythm.     Pulses: Normal pulses.     Heart sounds: Normal heart sounds.  Pulmonary:     Effort: Pulmonary effort is normal.     Breath sounds: Normal breath sounds.  Abdominal:     General: Abdomen is flat. Bowel sounds are normal. There is no distension.     Palpations: Abdomen is soft.     Tenderness:  There is no abdominal tenderness.  Musculoskeletal:        General: No swelling.     Cervical back: Neck supple.  Lymphadenopathy:     Cervical: No cervical adenopathy.  Skin:    General: Skin is warm and dry.     Findings: Rash present.     Comments: Extensive psoriasis covering her breasts abdomen and inner arms.  Neurological:     General: No focal deficit present.     Mental Status: She is alert.  Psychiatric:        Mood and Affect: Mood normal.        Behavior: Behavior normal.     LABORATORY DATA:  CBC    Component Value Date/Time   WBC 7.0 12/20/2022 1341   WBC 5.1 10/16/2022 1301   RBC 3.67 (L) 12/20/2022 1341   HGB 9.5 (L) 12/20/2022 1341   HCT 31.5 (L) 12/20/2022 1341   PLT 264 12/20/2022 1341   MCV 85.8 12/20/2022 1341   MCH 25.9 (L) 12/20/2022 1341  MCHC 30.2 12/20/2022 1341   RDW 18.7 (H) 12/20/2022 1341   LYMPHSABS 1.1 12/20/2022 1341   MONOABS 0.4 12/20/2022 1341   EOSABS 0.5 12/20/2022 1341   BASOSABS 0.0 12/20/2022 1341    CMP     Component Value Date/Time   NA 141 12/20/2022 1341   K 3.3 (L) 12/20/2022 1341   CL 105 12/20/2022 1341   CO2 29 12/20/2022 1341   GLUCOSE 109 (H) 12/20/2022 1341   BUN 15 12/20/2022 1341   CREATININE 1.00 12/20/2022 1341   CALCIUM 7.9 (L) 12/20/2022 1341   PROT 6.0 (L) 12/20/2022 1341   ALBUMIN 3.5 12/20/2022 1341   AST 18 12/20/2022 1341   ALT 10 12/20/2022 1341   ALKPHOS 118 12/20/2022 1341   BILITOT 0.6 12/20/2022 1341   GFRNONAA >60 12/20/2022 1341   GFRAA >60 02/24/2020 1454   GFRAA >60 07/24/2019 1453     ASSESSMENT and THERAPY PLAN:   Carcinoma of breast, estrogen receptor positive, stage 4, right (HCC) Suzanne [name] is a 62 year old woman with metastatic breast cancer here today for f/u on treatment with Fulvestrant and Truqap.    She is tolerating Truqap and fulvestrant well.  She will continue on her current treatment.  Her CBC is stable, CMET and CA27-29 pending.  Her most recent CA 27 29 was  decreased, indicating a response to treatment.  She will f/u with dermatology about her psoriasis.  I recommended she continue to check her blood sugar 1-2 times a week.    She will return in 4 weeks for labs, f/u, and her next injection.     All questions were answered. The patient knows to call the clinic with any problems, questions or concerns. We can certainly see the patient much sooner if necessary.  Total encounter time:30 minutes*in face-to-face visit time, chart review, lab review, care coordination, order entry, and documentation of the encounter time.  Lillard Anes, Suzanne [name] 12/20/22 2:42 PM Medical Oncology and Hematology [facility] [address] [place], [place] [address] Tel. [phone]    Fax. [phone]  *Total Encounter Time as defined by the Centers for Medicare and Medicaid Services includes, in addition to the face-to-face time of a patient visit (documented in the note above) non-face-to-face time: obtaining and reviewing outside history, ordering and reviewing medications, tests or procedures, care coordination (communications with other health care professionals or caregivers) and documentation in the medical record.

## 2022-12-21 ENCOUNTER — Telehealth: Payer: Self-pay | Admitting: Adult Health

## 2022-12-21 ENCOUNTER — Other Ambulatory Visit (HOSPITAL_COMMUNITY): Payer: Self-pay

## 2022-12-21 DIAGNOSIS — C50811 Malignant neoplasm of overlapping sites of right female breast: Secondary | ICD-10-CM | POA: Diagnosis not present

## 2022-12-21 NOTE — Telephone Encounter (Signed)
Scheduled appointments per 7/25 los. Patient is aware of the made appointments.

## 2022-12-24 ENCOUNTER — Other Ambulatory Visit: Payer: Self-pay | Admitting: Hematology and Oncology

## 2022-12-24 DIAGNOSIS — C50911 Malignant neoplasm of unspecified site of right female breast: Secondary | ICD-10-CM

## 2022-12-24 DIAGNOSIS — C7951 Secondary malignant neoplasm of bone: Secondary | ICD-10-CM

## 2022-12-24 DIAGNOSIS — C50811 Malignant neoplasm of overlapping sites of right female breast: Secondary | ICD-10-CM | POA: Diagnosis not present

## 2022-12-24 DIAGNOSIS — C7801 Secondary malignant neoplasm of right lung: Secondary | ICD-10-CM

## 2022-12-24 MED ORDER — POTASSIUM CHLORIDE ER 10 MEQ PO CPCR
10.0000 meq | ORAL_CAPSULE | Freq: Every day | ORAL | 4 refills | Status: DC
Start: 2022-12-24 — End: 2024-01-23
  Filled 2023-03-18: qty 90, 90d supply, fill #0
  Filled 2023-06-20: qty 90, 90d supply, fill #1
  Filled 2023-09-24: qty 60, 60d supply, fill #2
  Filled 2023-11-20: qty 60, 60d supply, fill #3

## 2022-12-24 NOTE — Telephone Encounter (Signed)
Received refill for lasix prescription. No note potassium prescription on med list. Note potassium level per lab last week of 3.3 - informed covering provider with recommendation given for potassium prescription. Order received and sent per above.  Called and informed pt of refill with additional potassium medication to be taken with it.

## 2022-12-25 DIAGNOSIS — C50811 Malignant neoplasm of overlapping sites of right female breast: Secondary | ICD-10-CM | POA: Diagnosis not present

## 2022-12-26 ENCOUNTER — Other Ambulatory Visit: Payer: Self-pay | Admitting: Family Medicine

## 2022-12-26 DIAGNOSIS — J302 Other seasonal allergic rhinitis: Secondary | ICD-10-CM

## 2022-12-26 DIAGNOSIS — C50811 Malignant neoplasm of overlapping sites of right female breast: Secondary | ICD-10-CM | POA: Diagnosis not present

## 2022-12-27 DIAGNOSIS — C7801 Secondary malignant neoplasm of right lung: Secondary | ICD-10-CM | POA: Diagnosis not present

## 2022-12-27 DIAGNOSIS — J40 Bronchitis, not specified as acute or chronic: Secondary | ICD-10-CM | POA: Diagnosis not present

## 2022-12-27 DIAGNOSIS — R6510 Systemic inflammatory response syndrome (SIRS) of non-infectious origin without acute organ dysfunction: Secondary | ICD-10-CM | POA: Diagnosis not present

## 2022-12-27 DIAGNOSIS — R0602 Shortness of breath: Secondary | ICD-10-CM | POA: Diagnosis not present

## 2022-12-27 DIAGNOSIS — N19 Unspecified kidney failure: Secondary | ICD-10-CM | POA: Diagnosis not present

## 2022-12-27 DIAGNOSIS — S22000A Wedge compression fracture of unspecified thoracic vertebra, initial encounter for closed fracture: Secondary | ICD-10-CM | POA: Diagnosis not present

## 2022-12-27 DIAGNOSIS — J9621 Acute and chronic respiratory failure with hypoxia: Secondary | ICD-10-CM | POA: Diagnosis not present

## 2022-12-27 DIAGNOSIS — C50811 Malignant neoplasm of overlapping sites of right female breast: Secondary | ICD-10-CM | POA: Diagnosis not present

## 2022-12-27 DIAGNOSIS — J309 Allergic rhinitis, unspecified: Secondary | ICD-10-CM | POA: Diagnosis not present

## 2022-12-28 DIAGNOSIS — C50811 Malignant neoplasm of overlapping sites of right female breast: Secondary | ICD-10-CM | POA: Diagnosis not present

## 2022-12-31 DIAGNOSIS — C50811 Malignant neoplasm of overlapping sites of right female breast: Secondary | ICD-10-CM | POA: Diagnosis not present

## 2023-01-01 ENCOUNTER — Other Ambulatory Visit (HOSPITAL_COMMUNITY): Payer: Self-pay

## 2023-01-01 ENCOUNTER — Other Ambulatory Visit: Payer: Self-pay

## 2023-01-01 DIAGNOSIS — C50811 Malignant neoplasm of overlapping sites of right female breast: Secondary | ICD-10-CM | POA: Diagnosis not present

## 2023-01-02 DIAGNOSIS — C50811 Malignant neoplasm of overlapping sites of right female breast: Secondary | ICD-10-CM | POA: Diagnosis not present

## 2023-01-04 ENCOUNTER — Other Ambulatory Visit: Payer: Self-pay | Admitting: *Deleted

## 2023-01-04 ENCOUNTER — Telehealth: Payer: Self-pay | Admitting: *Deleted

## 2023-01-04 ENCOUNTER — Other Ambulatory Visit (HOSPITAL_COMMUNITY): Payer: Self-pay

## 2023-01-04 DIAGNOSIS — C50811 Malignant neoplasm of overlapping sites of right female breast: Secondary | ICD-10-CM | POA: Diagnosis not present

## 2023-01-04 MED ORDER — METHOCARBAMOL 750 MG PO TABS: 750.0000 mg | ORAL_TABLET | Freq: Three times a day (TID) | ORAL | 0 refills | Status: DC | PRN

## 2023-01-04 NOTE — Telephone Encounter (Signed)
This RN spoke with pt per VM- per onset of pain in the arm/muscle that has hx of mets to the bone with fracture and placement of plate ( over 2 years ago).  Pain noted with change in barometric pressure and is continuing - pt used to be on Robaxin for bone discomfort and is asking if she could refill on the medication.  Pt's next visit is in 2 weeks in this office.

## 2023-01-04 NOTE — Telephone Encounter (Signed)
After verifying no focal weakness of the arm, ok to Refill with low # of supply (5) and have her let us know early next week how her arm is.  If her arm is not improved, recommend getting her back in with ortho.

## 2023-01-11 ENCOUNTER — Ambulatory Visit: Payer: Medicaid Other

## 2023-01-14 ENCOUNTER — Encounter: Payer: Self-pay | Admitting: Dermatology

## 2023-01-14 ENCOUNTER — Ambulatory Visit (INDEPENDENT_AMBULATORY_CARE_PROVIDER_SITE_OTHER): Payer: Medicaid Other | Admitting: Dermatology

## 2023-01-14 ENCOUNTER — Other Ambulatory Visit: Payer: Self-pay

## 2023-01-14 DIAGNOSIS — L409 Psoriasis, unspecified: Secondary | ICD-10-CM

## 2023-01-14 DIAGNOSIS — C50811 Malignant neoplasm of overlapping sites of right female breast: Secondary | ICD-10-CM | POA: Diagnosis not present

## 2023-01-14 MED ORDER — TRIAMCINOLONE ACETONIDE 0.1 % EX OINT: 1.0000 | TOPICAL_OINTMENT | Freq: Two times a day (BID) | CUTANEOUS | 3 refills | Status: DC

## 2023-01-14 MED ORDER — OTEZLA 30 MG PO TABS
30.0000 mg | ORAL_TABLET | Freq: Two times a day (BID) | ORAL | 11 refills | Status: AC
Start: 2023-01-14 — End: ?

## 2023-01-14 MED ORDER — CLOBETASOL PROPIONATE 0.05 % EX SOLN
1.0000 | Freq: Two times a day (BID) | CUTANEOUS | 2 refills | Status: DC
Start: 2023-01-14 — End: 2023-01-22

## 2023-01-14 NOTE — Progress Notes (Signed)
   Follow-Up Visit   Subjective  Suzanne [name] is a 62 y.o. female accompanied by daughter Suzanne [name]) who presents for the following: Psoriasis  Patient present today for follow up visit for Psoriasis. Patient was last evaluated on 09/03/22. Patient reports sxs are not at goal. Patient reports medication changes.Her oncologist prescribed Trupaq in May. She noticed shortly after starting she had a flare that the creams hasn't helped with.  She is currently on [facility] and Tacrolimus Topicals. She reports she applies each once daily. Patient reports has co-morbidities that won't allow her to apply to all of the effected parts of her body. Her daughter reports she like to be independent and doesn't allow her to help.  The following portions of the chart were reviewed this encounter and updated as appropriate: medications, allergies, medical history  Review of Systems:  No other skin or systemic complaints except as noted in HPI or Assessment and Plan.  Objective  Well appearing patient in no apparent distress; mood and affect are within normal limits.  A focused examination was performed of the following areas: Arms, Legs, Scalp, Palms of hands  Relevant exam findings are noted in the Assessment and Plan.       Assessment & Plan   PSORIASIS Exam: Well-demarcated erythematous papules/plaques with silvery scale, guttate pink scaly papules. 80% BSA. PGA Score: 3  Chronic and persistent condition with duration or expected duration over one year. Condition is symptomatic / bothersome to patient. Not to goal.   Patient denies joint pain  Psoriasis is a chronic non-curable, but treatable genetic/hereditary disease that may have other systemic features affecting other organ systems such as joints (Psoriatic Arthritis). It is associated with an increased risk of inflammatory bowel disease, heart disease, non-alcoholic fatty liver disease, and depression.  Treatments include light and laser treatments;  topical medications; and systemic medications including oral and injectables.  Prior Failed Treatments: -Triamcinolone -Tacrolimus -Hydrocortisone   Treatment Plan:  - We will plan to prescribe Otezla, Starter pack provided while in office today. Advise pt to contact our office if more is needed prior to medication getting approved - Advised to continue topical TMC for 2 weeks then STOP and Alternate with Tacrolimus for 2 weeks then STOP. Continue alternating until clear - We recommended apply Cerave anti-itch lotion to help with excessive itching - Recommends washing hair with DHS Zinc Shampoo, Allow to sit on the scalp 1-2 minutes, then washing - We will also prescribe clobetasol Scalp Solution to help with Psoriasis flare on her scalp. - Advised if she experiencing diarrhea when starting [place], educated on that being a potential side effect. Recommend taking Immodium to help until bowel movements regulates - In 3 Months we will plan to complete a PHQ9 to screen for depression due to hx of Depression d/t   Psoriasis   Return in 3 months (on 04/16/2023) for Psoriasis F/U.  Documentation: I have reviewed the above documentation for accuracy and completeness, and I agree with the above.  Suzanne [name], am acting as scribe for Suzanne Reusing, DO.  Suzanne Reusing, DO

## 2023-01-14 NOTE — Patient Instructions (Addendum)
Hello Suzanne [name],  Thank you for visiting [place] today. Your dedication to managing your health, especially given your complex medical conditions, is truly commendable. Below is a concise summary of our discussion and the forward path regarding your treatment plan:  - Medications and Usage:   - Otezla: Initiate treatment with a titration pack, advancing to 30mg  twice daily. Be vigilant for side effects, notably depression and diarrhea.   - Triamcinolone and Tacrolimus Creams: For your psoriasis, continue with Triamcinolone applied twice daily for two weeks, followed by Tacrolimus for the subsequent two weeks. This cycle should be repeated.   - Vaseline: To be applied on itchy and open areas prior to cream application.   - CeraVe Anti-Itch Lotion: Use as necessary to alleviate itchiness.   - DHS Zinc Shampoo: Recommended for scalp psoriasis. Allow it to sit on the scalp before rinsing off.   - Clobetasol Drops: For residual scalp itching and flaking, apply once or twice daily as needed.  - Lifestyle Adjustments:   - Opt for cool, breathable fabrics and stay in air-conditioned settings to manage heat sensitivity effectively.   - Utilize cold compresses to reduce inflammation.  - Monitoring and Follow-Up:   - Monitor for any mood changes, including signs of depression or suicidal thoughts, and report them immediately.   - Schedule a return visit in three months for a follow-up to evaluate your treatment's progress and discuss any changes in mood.   - Should there be any issues with obtaining Otezla, please contact our office for additional samples.  - Additional Notes:   - In case of diarrhea, Imodium may be used according to your tolerance.   - Ensure the proper application of all topical treatments, seeking assistance if necessary.  Please feel free to reach out with any questions or concerns before our next meeting.  Warm regards,  Dr. Langston Reusing, Dermatology      Important  Information  Due to recent changes in healthcare laws, you may see results of your pathology and/or laboratory studies on MyChart before the doctors have had a chance to review them. We understand that in some cases there may be results that are confusing or concerning to you. Please understand that not all results are received at the same time and often the doctors may need to interpret multiple results in order to provide you with the best plan of care or course of treatment. Therefore, we ask that you please give [place] 2 business days to thoroughly review all your results before contacting the office for clarification. Should we see a critical lab result, you will be contacted sooner.   If You Need Anything After Your Visit  If you have any questions or concerns for your doctor, please call our main line at [phone] If no one answers, please leave a voicemail as directed and we will return your call as soon as possible. Messages left after 4 pm will be answered the following business day.   You may also send [place] a message via MyChart. We typically respond to MyChart messages within 1-2 business days.  For prescription refills, please ask your pharmacy to contact our office. Our fax number is [phone].  If you have an urgent issue when the clinic is closed that cannot wait until the next business day, you can page your doctor at the number below.    Please note that while we do our best to be available for urgent issues outside of office hours, we are not available  24/7.   If you have an urgent issue and are unable to reach [place], you may choose to seek medical care at your doctor's office, retail clinic, urgent care center, or emergency room.  If you have a medical emergency, please immediately call 911 or go to the emergency department. In the event of inclement weather, please call our main line at [phone] for an update on the status of any delays or closures.  Dermatology Medication  Tips: Please keep the boxes that topical medications come in in order to help keep track of the instructions about where and how to use these. Pharmacies typically print the medication instructions only on the boxes and not directly on the medication tubes.   If your medication is too expensive, please contact our office at [phone] or send [place] a message through MyChart.   We are unable to tell what your co-pay for medications will be in advance as this is different depending on your insurance coverage. However, we may be able to find a substitute medication at lower cost or fill out paperwork to get insurance to cover a needed medication.   If a prior authorization is required to get your medication covered by your insurance company, please allow [place] 1-2 business days to complete this process.  Drug prices often vary depending on where the prescription is filled and some pharmacies may offer cheaper prices.  The website www.goodrx.com contains coupons for medications through different pharmacies. The prices here do not account for what the cost may be with help from insurance (it may be cheaper with your insurance), but the website can give you the price if you did not use any insurance.  - You can print the associated coupon and take it with your prescription to the pharmacy.  - You may also stop by our office during regular business hours and pick up a GoodRx coupon card.  - If you need your prescription sent electronically to a different pharmacy, notify our office through [facility] or by phone at [phone]

## 2023-01-15 ENCOUNTER — Other Ambulatory Visit (HOSPITAL_COMMUNITY): Payer: Self-pay

## 2023-01-15 ENCOUNTER — Telehealth: Payer: Self-pay | Admitting: *Deleted

## 2023-01-15 DIAGNOSIS — C50811 Malignant neoplasm of overlapping sites of right female breast: Secondary | ICD-10-CM | POA: Diagnosis not present
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
## 2023-01-16 DIAGNOSIS — C50811 Malignant neoplasm of overlapping sites of right female breast: Secondary | ICD-10-CM | POA: Diagnosis not present

## 2023-01-17 ENCOUNTER — Inpatient Hospital Stay (HOSPITAL_BASED_OUTPATIENT_CLINIC_OR_DEPARTMENT_OTHER): Payer: Medicaid Other | Admitting: Hematology and Oncology

## 2023-01-17 ENCOUNTER — Encounter: Payer: Self-pay | Admitting: Hematology and Oncology

## 2023-01-17 ENCOUNTER — Inpatient Hospital Stay: Payer: Medicaid Other

## 2023-01-17 ENCOUNTER — Inpatient Hospital Stay: Payer: Medicaid Other | Attending: Adult Health

## 2023-01-17 VITALS — BP 125/52 | HR 100 | Temp 98.1°F | Resp 18 | Ht 59.0 in | Wt 175.6 lb

## 2023-01-17 DIAGNOSIS — C7951 Secondary malignant neoplasm of bone: Secondary | ICD-10-CM

## 2023-01-17 DIAGNOSIS — Z170 Estrogen receptor positive status [ER+]: Secondary | ICD-10-CM

## 2023-01-17 DIAGNOSIS — Z5111 Encounter for antineoplastic chemotherapy: Secondary | ICD-10-CM | POA: Insufficient documentation

## 2023-01-17 DIAGNOSIS — Z79899 Other long term (current) drug therapy: Secondary | ICD-10-CM | POA: Diagnosis not present

## 2023-01-17 DIAGNOSIS — C50811 Malignant neoplasm of overlapping sites of right female breast: Secondary | ICD-10-CM | POA: Insufficient documentation

## 2023-01-17 LAB — CMP (CANCER CENTER ONLY)
ALT: 11 U/L (ref 0–44)
AST: 25 U/L (ref 15–41)
Albumin: 3.4 g/dL — ABNORMAL LOW (ref 3.5–5.0)
Alkaline Phosphatase: 115 U/L (ref 38–126)
Anion gap: 9 (ref 5–15)
BUN: 11 mg/dL (ref 8–23)
CO2: 29 mmol/L (ref 22–32)
Calcium: 8.6 mg/dL — ABNORMAL LOW (ref 8.9–10.3)
Chloride: 107 mmol/L (ref 98–111)
Creatinine: 1.24 mg/dL — ABNORMAL HIGH (ref 0.44–1.00)
GFR, Estimated: 49 mL/min — ABNORMAL LOW (ref >60)
Glucose, Bld: 147 mg/dL — ABNORMAL HIGH (ref 70–99)
Potassium: 4.3 mmol/L (ref 3.5–5.1)
Sodium: 145 mmol/L (ref 135–145)
Total Bilirubin: 0.6 mg/dL (ref 0.3–1.2)
Total Protein: 6.1 g/dL — ABNORMAL LOW (ref 6.5–8.1)

## 2023-01-17 LAB — VITAMIN B12: Vitamin B-12: 698 pg/mL (ref 180–914)

## 2023-01-17 LAB — CBC WITH DIFFERENTIAL (CANCER CENTER ONLY)
Abs Immature Granulocytes: 0.03 10*3/uL (ref 0.00–0.07)
Basophils Absolute: 0.1 10*3/uL (ref 0.0–0.1)
Basophils Relative: 1 %
Eosinophils Absolute: 0.7 10*3/uL — ABNORMAL HIGH (ref 0.0–0.5)
Eosinophils Relative: 10 %
HCT: 31.6 % — ABNORMAL LOW (ref 36.0–46.0)
Hemoglobin: 9.2 g/dL — ABNORMAL LOW (ref 12.0–15.0)
Immature Granulocytes: 1 %
Lymphocytes Relative: 21 %
Lymphs Abs: 1.3 10*3/uL (ref 0.7–4.0)
MCH: 25.5 pg — ABNORMAL LOW (ref 26.0–34.0)
MCHC: 29.1 g/dL — ABNORMAL LOW (ref 30.0–36.0)
MCV: 87.5 fL (ref 80.0–100.0)
Monocytes Absolute: 0.4 10*3/uL (ref 0.1–1.0)
Monocytes Relative: 6 %
Neutro Abs: 3.8 10*3/uL (ref 1.7–7.7)
Neutrophils Relative %: 61 %
Platelet Count: 312 10*3/uL (ref 150–400)
RBC: 3.61 MIL/uL — ABNORMAL LOW (ref 3.87–5.11)
RDW: 18.6 % — ABNORMAL HIGH (ref 11.5–15.5)
WBC Count: 6.3 10*3/uL (ref 4.0–10.5)
nRBC: 0 % (ref 0.0–0.2)

## 2023-01-17 MED ORDER — DENOSUMAB 120 MG/1.7ML ~~LOC~~ SOLN
120.0000 mg | Freq: Once | SUBCUTANEOUS | Status: AC
  Administered 2023-01-17: 120 mg via SUBCUTANEOUS
  Filled 2023-01-17: qty 1.7

## 2023-01-17 MED ORDER — FULVESTRANT 250 MG/5ML IM SOSY
500.0000 mg | PREFILLED_SYRINGE | Freq: Once | INTRAMUSCULAR | Status: AC
  Administered 2023-01-17: 500 mg via INTRAMUSCULAR
  Filled 2023-01-17: qty 10

## 2023-01-17 NOTE — Progress Notes (Signed)
[facility] Cancer Follow up:    Suzanne Rigg, NP [address] [place] [address]   DIAGNOSIS: Metastatic breast cancer  SUMMARY OF ONCOLOGIC HISTORY: Suzanne [name], Suzanne [name] Suzanne [name] woman presenting 10/26/2018 with right-sided inflammatory breast cancer, stage IV, involving lungs, lymph nodes and bones, as follows:             (a) chest CT scan 10/26/2018 shows bilateral pleural effusions, possible lymphangitic spread of tumor, diffuse bony metastatic disease, and significant axillary mediastinal and hilar adenopathy             (b) bone scan 10/27/2018 is a "near SuperScan" consistent with widespread bony metastatic disease             (c) head CT with and without contrast 10/30/2018 shows no intracranial metastatic disease, multiple calvarial lesions             (d) CA-27-29 on 10/27/2018 was 1810.4   (1) pleural fluid from right thoracentesis 10/28/2018 confirms malignant cells consistent with a breast primary, strongly estrogen and progesterone receptor positive, HER-2 not amplified, with an MIB-1 of 2%   (2) anastrozole started 10/29/2018             (a) palbociclib started 11/13/2018 at 100 mg dose             (b) denosumab/xgeva started 11/13/2018              (C) palbociclib dose decreased to 75mg              (D) Palbociclib dose decreased to 75 mg every other day in 11/2021   (3) associated problems:             (a) hypoxia secondary to effusions             (b) pain from bone lesions and left humeral and vertebral compression fractures             (c) right upper extremity lymphedema             (d) poor venous access   (4) genetics testing on 12/25/2018 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 STK11, TP53, TSC1, TSC2, and VHL.  The following genes were evaluated for sequence changes only: SDHA and HOXB13 c.251G>A variant only. The [facility] Melanoma Panel analyzed the following 9 genes: BAP1 BRCA2 CDK4 CDKN2A MITF POT1 PTEN RB1 Tp53.    (5) palliative radiation to the left humerus 07/20/2019 - 07/31/2019 Site Technique Total Dose (Gy) Dose per Fx (Gy) Completed Fx Beam Energies  Humerus, Left: Ext_Lt_humerus Complex 30/30 3 10/10 6X    (6) anemia requiring transfusion:              (a) B-12 level 134 FEB 2022: B-12 injectons started              (B) iron deficient, IV iron to start 2/6             (C) Hospitalized with upper GI bleed  05/27/2022-05/30/2022   (7) Elacestrant started 04/30/2022, progression noted on imaging from May 2024       CURRENT THERAPY: Capivasertib, Fulvestrant  INTERVAL HISTORY:  Suzanne [name] 62 y.o. female returns for follow-up prior to receiving her fulvestrant injection.   Since her last visit, she has noticed increasing chills about a week, and notices some sweating at night. No dysuria, cough, SOB, diarrhea.  She is also having a flare up of psoriasis. She is very excited about the dropping tumor marker. She says besides the chills, she feels so much better.  She is hoping to continue treatment.  Rest of the pertinent 10 point ROS reviewed and negative  Patient Active Problem List   Diagnosis Date Noted   UGI bleed 05/27/2022   Acute prerenal azotemia 04/18/2022   Dehydration 04/18/2022   SIRS (systemic inflammatory response syndrome) (HCC) 04/18/2022   Allergic rhinitis 04/18/2022   Depression 04/18/2022   Anemia of chronic disease 04/18/2022   Bronchitis 04/18/2022    Acute bronchitis 04/17/2022   Intertrigo 01/10/2022   Bacterial vaginitis 01/10/2022   Sepsis due to cellulitis (HCC) 12/06/2021   B12 deficiency anemia 08/15/2020   Sensorineural hearing loss (SNHL) of both ears 08/07/2019   Intractable back pain 02/03/2019   Thoracic compression fracture, closed, initial encounter (HCC) 02/03/2019   Essential hypertension 02/03/2019   Genetic testing 01/27/2019   Acute pharyngitis    Bacteremia    FTT (failure to thrive) in adult    Fracture closed, humerus, shaft 01/09/2019   Pneumonia 12/25/2018   Acute respiratory disorder in immunocompromised patient (HCC) 12/25/2018   Family history of breast cancer    Family history of prostate cancer    Family history of melanoma    Family history of colon cancer    Pressure injury of skin 11/24/2018   Chronic respiratory failure with hypoxia and hypercapnia (HCC) 11/19/2018   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   Hypophosphatemia 11/19/2018   Hypocalcemia 11/19/2018   Aspiration pneumonia (HCC) 11/19/2018   SOB (shortness of breath)    Palliative care by specialist    Carcinoma of breast, estrogen receptor positive, stage 4, right (HCC) 11/16/2018   Hypoalbuminemia 11/16/2018   Pleural effusion 11/14/2018   Goals of care, counseling/discussion 11/06/2018   Malignant neoplasm of overlapping sites of right breast in female, estrogen receptor positive (HCC) 10/31/2018   Lung metastasis 10/31/2018   Pain from bone metastases (HCC) 10/31/2018   Class 2 obesity 10/31/2018   Bone injury    Pleural effusion, bilateral    Shortness of breath    Abnormal breast finding    Hypokalemia    Breast skin changes    Macrocytic anemia    Malignant neoplasm metastatic to bone (HCC)    Anemia, B12 deficiency 10/26/2018    is allergic to ferumoxytol.  MEDICAL HISTORY: Past Medical History:  Diagnosis Date   Anemia    Cancer (HCC)    Family history of breast cancer    Family history of colon cancer     Family history of melanoma    Family history of prostate cancer    Gallstones    History of hiatal hernia    History of kidney stones    Mastitis    reports history of recurrent mastitis   Metastatic breast cancer    Obesity    Psoriasis     SURGICAL HISTORY: Past Surgical History:  Procedure Laterality Date   CYSTOSCOPY/URETEROSCOPY/HOLMIUM LASER/STENT PLACEMENT Left 10/17/2022   Procedure: CYSTOSCOPY, LEFT RETROGRADE PYELOGRAM,  LEFT URETEROSCOPY, HOLMIUM LASER LITHOTRIPSY, BASKET EXTRACTION, AND LEFT URETERAL STENT PLACEMENT;  Surgeon: Rene Paci, MD;  Location: [facility];  Service: Urology;  Laterality: Left;  45 MINUTES   ESOPHAGOGASTRODUODENOSCOPY (EGD) WITH PROPOFOL N/A 05/27/2022   Procedure: ESOPHAGOGASTRODUODENOSCOPY (EGD) WITH PROPOFOL;  Surgeon: Willis Modena, MD;  Location: [facility];  Service: Gastroenterology;  Laterality: N/A;   EYE SURGERY     cataract   GLAUCOMA SURGERY  late 1990's   IR THORACENTESIS ASP PLEURAL SPACE W/IMG GUIDE  10/28/2018   IR THORACENTESIS ASP PLEURAL SPACE W/IMG GUIDE  10/29/2018   OPEN REDUCTION INTERNAL FIXATION (ORIF) DISTAL RADIAL FRACTURE Left 01/09/2019   Procedure: OPEN REDUCTION INTERNAL FIXATION (ORIF) HUMERAL FRACTURE;  Surgeon: Beverely Low, MD;  Location: [facility];  Service: Orthopedics;  Laterality: Left;    SOCIAL HISTORY: Social History   Socioeconomic History   Marital status: Widowed    Spouse name: Not on file   Number of children: 1   Years of education: Not on file   Highest education level: Not on file  Occupational History   Occupation: Visual merchandiser Programs    Comment: Self-employed  Tobacco Use   Smoking status: Never   Smokeless tobacco: Never   Tobacco comments:    second hand smoke exposure  Vaping Use   Vaping status: Never Used  Substance and Sexual Activity   Alcohol use: No   Drug use: No   Sexual activity: Not Currently  Other Topics Concern   Not on file  Social History  Narrative   Has a daughter named [name]. Daughter has CP.   Social Determinants of Health   Financial Resource Strain: Low Risk  (09/17/2022)   Overall Financial Resource Strain (CARDIA)    Difficulty of Paying Living Expenses: Not hard at all  Food Insecurity: No Food Insecurity (09/17/2022)   Hunger Vital Sign    Worried About Running Out of Food in the Last Year: Never true    Ran Out of Food in the Last Year: Never true  Transportation Needs: No Transportation Needs (09/17/2022)   PRAPARE - Administrator, Civil Service (Medical): No    Lack of Transportation (Non-Medical): No  Physical Activity: Not on file  Stress: Stress Concern Present (11/08/2021)   [employer] of Occupational Health - Occupational Stress Questionnaire    Feeling of Stress : To some extent  Social Connections: Socially Isolated (03/27/2021)   Social Connection and Isolation Panel [NHANES]    Frequency of Communication with Friends and Family: Once a week    Frequency of Social Gatherings with Friends and Family: More than three times a week    Attends Religious Services: Never    Database administrator or Organizations: No    Attends Banker Meetings: Never    Marital Status: Widowed  Intimate Partner Violence: Not At Risk (09/17/2022)   Humiliation, Afraid, Rape, and Kick questionnaire    Fear of Current or Ex-Partner: No    Emotionally Abused: No    Physically Abused: No    Sexually Abused: No    FAMILY HISTORY: Family History  Problem Relation Age of Onset   Breast cancer Sister        in her 70's-70s   Heart disease Brother        CABG   Heart disease Brother        CABG   Skin cancer Brother        melanoma   Colon cancer Cousin  Heart attack Mother    Heart attack Father    Prostate cancer Brother     Review of Systems  Constitutional:  Positive for fatigue (mild). Negative for appetite change, chills, fever and unexpected weight change.  HENT:    Negative for hearing loss, lump/mass and trouble swallowing.   Eyes:  Negative for eye problems and icterus.  Respiratory:  Negative for chest tightness, cough and shortness of breath.   Cardiovascular:  Negative for chest pain, leg swelling and palpitations.  Gastrointestinal:  Negative for abdominal distention, abdominal pain, constipation, diarrhea, nausea and vomiting.  Endocrine: Negative for hot flashes.  Genitourinary:  Negative for difficulty urinating.   Musculoskeletal:  Negative for arthralgias.  Skin:  Negative for itching and rash.  Neurological:  Negative for dizziness, extremity weakness, headaches and numbness.  Hematological:  Negative for adenopathy. Does not bruise/bleed easily.  Psychiatric/Behavioral:  Negative for depression. The patient is not nervous/anxious.       PHYSICAL EXAMINATION     Vitals:   01/17/23 1403  BP: (!) 125/52  Pulse: 100  Resp: 18  Temp: 98.1 F (36.7 C)  SpO2: 100%     Physical Exam Constitutional:      General: She is not in acute distress.    Appearance: Normal appearance. She is not toxic-appearing.  HENT:     Head: Normocephalic and atraumatic.     Mouth/Throat:     Mouth: Mucous membranes are moist.     Pharynx: Oropharynx is clear. No oropharyngeal exudate or posterior oropharyngeal erythema.  Eyes:     General: No scleral icterus. Cardiovascular:     Rate and Rhythm: Normal rate and regular rhythm.     Pulses: Normal pulses.     Heart sounds: Normal heart sounds.  Pulmonary:     Effort: Pulmonary effort is normal.     Breath sounds: Normal breath sounds.  Abdominal:     General: Abdomen is flat. Bowel sounds are normal. There is no distension.     Palpations: Abdomen is soft.     Tenderness: There is no abdominal tenderness.  Musculoskeletal:        General: No swelling.     Cervical back: Neck supple.  Lymphadenopathy:     Cervical: No cervical adenopathy.  Skin:    General: Skin is warm and dry.      Findings: Rash present.     Comments: Extensive psoriasis covering her breasts abdomen and inner arms.  Neurological:     General: No focal deficit present.     Mental Status: She is alert.  Psychiatric:        Mood and Affect: Mood normal.        Behavior: Behavior normal.     LABORATORY DATA:  CBC    Component Value Date/Time   WBC 6.3 01/17/2023 1341   WBC 5.1 10/16/2022 1301   RBC 3.61 (L) 01/17/2023 1341   HGB 9.2 (L) 01/17/2023 1341   HCT 31.6 (L) 01/17/2023 1341   PLT 312 01/17/2023 1341   MCV 87.5 01/17/2023 1341   MCH 25.5 (L) 01/17/2023 1341   MCHC 29.1 (L) 01/17/2023 1341   RDW 18.6 (H) 01/17/2023 1341   LYMPHSABS 1.3 01/17/2023 1341   MONOABS 0.4 01/17/2023 1341   EOSABS 0.7 (H) 01/17/2023 1341   BASOSABS 0.1 01/17/2023 1341    CMP     Component Value Date/Time   NA 141 12/20/2022 1341   K 3.3 (L) 12/20/2022 1341   CL 105 12/20/2022  1341   CO2 29 12/20/2022 1341   GLUCOSE 109 (H) 12/20/2022 1341   BUN 15 12/20/2022 1341   CREATININE 1.00 12/20/2022 1341   CALCIUM 7.9 (L) 12/20/2022 1341   PROT 6.0 (L) 12/20/2022 1341   ALBUMIN 3.5 12/20/2022 1341   AST 18 12/20/2022 1341   ALT 10 12/20/2022 1341   ALKPHOS 118 12/20/2022 1341   BILITOT 0.6 12/20/2022 1341   GFRNONAA >60 12/20/2022 1341   GFRAA >60 02/24/2020 1454   GFRAA >60 07/24/2019 1453     ASSESSMENT and THERAPY PLAN:   #Metastatic breast cancer involving the bone:   S/P first line Palbociclib with AI. We started her on Faslodex while we were awaiting molecular testing. She was on Ibrance for almost 2 yrs. We have suspected that there could be some progression ongoing given her rising tumor markers and since she has been on this combination for almost 2+ years. Biopsy confirmed metastatic breast cancer ER, PR positive, Her 2 neg. 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 She was tried on second line Orserdu from February 2024 through May 2024 with evidence of progression. Given AKT mutation she is now on combination of Faslodex with capivasertib.  She is tolerating this extremely well.  Her tumor marker has down trended significantly.  She is on capivasertib 320 in the morning and 160 in the evening. Medically and tumor marker wise, she has shown significant response.  Will proceed with imaging and confirm response.  In the interim she will continue the dose as prescribed.  With regards to chills, she describes no other symptoms concerning for infection.  There is no leukocytosis.  Hence we have agreed to monitor this.  She will continue to follow-up with dermatology for psoriasis.  All questions were answered. The patient knows to call the clinic with any problems, questions or concerns. We can certainly see the patient much sooner if necessary.  Total encounter time:30 minutes*in face-to-face visit time, chart review, lab review, care coordination, order entry, and documentation of the encounter time.   *Total Encounter Time as defined by the Centers for Medicare and Medicaid Services includes, in addition to the face-to-face time of a patient visit (documented in the note above) non-face-to-face time: obtaining and reviewing outside history, ordering and reviewing medications, tests or procedures, care coordination (communications with other health care professionals or caregivers) and documentation in the medical record.

## 2023-01-17 NOTE — Assessment & Plan Note (Signed)
Suzanne [name] is a 62 year old woman with metastatic breast cancer currently on treatment with Truqap and faslodex.  She tells me that she is doing moderately well today.  She has no clinical signs of metastatic breast cancer progression.

## 2023-01-18 ENCOUNTER — Other Ambulatory Visit (HOSPITAL_COMMUNITY): Payer: Self-pay

## 2023-01-18 DIAGNOSIS — C50811 Malignant neoplasm of overlapping sites of right female breast: Secondary | ICD-10-CM | POA: Diagnosis not present

## 2023-01-18 LAB — CANCER ANTIGEN 27.29: CA 27.29: 239.5 U/mL — ABNORMAL HIGH (ref 0.0–38.6)

## 2023-01-21 ENCOUNTER — Other Ambulatory Visit: Payer: Self-pay | Admitting: Hematology and Oncology

## 2023-01-21 ENCOUNTER — Other Ambulatory Visit: Payer: Self-pay | Admitting: Physician Assistant

## 2023-01-21 DIAGNOSIS — C50811 Malignant neoplasm of overlapping sites of right female breast: Secondary | ICD-10-CM | POA: Diagnosis not present

## 2023-01-22 ENCOUNTER — Encounter: Payer: Self-pay | Admitting: Dermatology

## 2023-01-22 ENCOUNTER — Other Ambulatory Visit: Payer: Self-pay

## 2023-01-22 DIAGNOSIS — C50811 Malignant neoplasm of overlapping sites of right female breast: Secondary | ICD-10-CM | POA: Diagnosis not present

## 2023-01-22 MED ORDER — CLOBETASOL PROPIONATE 0.05 % EX SOLN
1.0000 | Freq: Two times a day (BID) | CUTANEOUS | 2 refills | Status: AC
Start: 2023-01-22 — End: ?
  Filled 2023-01-22: qty 50, 25d supply, fill #0

## 2023-01-22 MED ORDER — TRIAMCINOLONE ACETONIDE 0.1 % EX OINT
1.0000 | TOPICAL_OINTMENT | Freq: Two times a day (BID) | CUTANEOUS | 3 refills | Status: DC
  Filled 2023-01-22: qty 454, 30d supply, fill #0

## 2023-01-23 ENCOUNTER — Ambulatory Visit: Payer: Medicaid Other | Attending: Nurse Practitioner

## 2023-01-23 DIAGNOSIS — Z23 Encounter for immunization: Secondary | ICD-10-CM

## 2023-01-23 DIAGNOSIS — C50811 Malignant neoplasm of overlapping sites of right female breast: Secondary | ICD-10-CM | POA: Diagnosis not present

## 2023-01-23 NOTE — Progress Notes (Signed)
Shingrix vaccine administered IN RIGHT DELTOID per protocols.  Information sheet given. Patient denies and pain or discomfort at injection site. Tolerated injection well no reaction.

## 2023-01-24 DIAGNOSIS — C50811 Malignant neoplasm of overlapping sites of right female breast: Secondary | ICD-10-CM | POA: Diagnosis not present

## 2023-01-25 ENCOUNTER — Other Ambulatory Visit (HOSPITAL_COMMUNITY): Payer: Self-pay

## 2023-01-25 DIAGNOSIS — C50811 Malignant neoplasm of overlapping sites of right female breast: Secondary | ICD-10-CM | POA: Diagnosis not present

## 2023-01-27 ENCOUNTER — Other Ambulatory Visit (HOSPITAL_COMMUNITY): Payer: Self-pay

## 2023-01-27 DIAGNOSIS — R0602 Shortness of breath: Secondary | ICD-10-CM | POA: Diagnosis not present

## 2023-01-27 DIAGNOSIS — J9621 Acute and chronic respiratory failure with hypoxia: Secondary | ICD-10-CM | POA: Diagnosis not present

## 2023-01-27 DIAGNOSIS — S22000A Wedge compression fracture of unspecified thoracic vertebra, initial encounter for closed fracture: Secondary | ICD-10-CM | POA: Diagnosis not present

## 2023-01-27 DIAGNOSIS — C7801 Secondary malignant neoplasm of right lung: Secondary | ICD-10-CM | POA: Diagnosis not present

## 2023-01-27 MED ORDER — PANTOPRAZOLE SODIUM 40 MG PO TBEC
40.0000 mg | DELAYED_RELEASE_TABLET | Freq: Every day | ORAL | 3 refills | Status: DC
  Filled 2023-02-05: qty 90, 30d supply, fill #0
  Filled 2023-04-19: qty 90, 30d supply, fill #1
  Filled 2023-07-19: qty 90, 30d supply, fill #2

## 2023-01-27 MED ORDER — TRIAMCINOLONE ACETONIDE 0.1 % EX OINT: TOPICAL_OINTMENT | Freq: Two times a day (BID) | CUTANEOUS | 3 refills | Status: DC

## 2023-01-27 MED ORDER — CLOBETASOL PROPIONATE 0.05 % EX SOLN: 1.0000 | Freq: Two times a day (BID) | CUTANEOUS | 2 refills | Status: DC

## 2023-01-27 MED ORDER — OXYBUTYNIN CHLORIDE 5 MG PO TABS: 5.0000 mg | ORAL_TABLET | Freq: Three times a day (TID) | ORAL | 1 refills | Status: DC | PRN

## 2023-01-28 DIAGNOSIS — C50811 Malignant neoplasm of overlapping sites of right female breast: Secondary | ICD-10-CM | POA: Diagnosis not present

## 2023-01-29 ENCOUNTER — Other Ambulatory Visit: Payer: Self-pay

## 2023-01-29 ENCOUNTER — Encounter: Payer: Self-pay | Admitting: Dermatology

## 2023-01-29 DIAGNOSIS — C50811 Malignant neoplasm of overlapping sites of right female breast: Secondary | ICD-10-CM | POA: Diagnosis not present

## 2023-01-29 NOTE — Telephone Encounter (Signed)
Please notify patient that she should continue the starter pack and that we will do the appeal.  It should be apporeved with the appeal and if not we will reach out to the [place] Patient assistance program.  Thanks!

## 2023-01-29 NOTE — Telephone Encounter (Signed)
Hi Jetta, can you contact patient and let her know she can pick up another sample anytime this week.  Thanks!

## 2023-01-30 ENCOUNTER — Other Ambulatory Visit: Payer: Self-pay | Admitting: Hematology and Oncology

## 2023-01-30 ENCOUNTER — Other Ambulatory Visit (HOSPITAL_COMMUNITY): Payer: Self-pay

## 2023-01-30 DIAGNOSIS — C50811 Malignant neoplasm of overlapping sites of right female breast: Secondary | ICD-10-CM | POA: Diagnosis not present

## 2023-01-30 DIAGNOSIS — C7951 Secondary malignant neoplasm of bone: Secondary | ICD-10-CM

## 2023-01-30 MED ORDER — CAPIVASERTIB 160 MG PO TABS
ORAL_TABLET | ORAL | 1 refills | Status: DC
Start: 2023-01-30 — End: 2023-04-19
  Filled 2023-01-30: qty 64, 34d supply, fill #0
  Filled 2023-03-07: qty 64, 34d supply, fill #1

## 2023-01-30 NOTE — Addendum Note (Signed)
Encounter addended by: Edilia Bo on: 01/30/2023 1:37 PM  Actions taken: Imaging Exam ended

## 2023-01-30 NOTE — Addendum Note (Signed)
Encounter addended by: Edilia Bo on: 01/30/2023 10:54 AM  Actions taken: Imaging Exam ended

## 2023-01-31 ENCOUNTER — Other Ambulatory Visit (HOSPITAL_COMMUNITY): Payer: Self-pay

## 2023-01-31 ENCOUNTER — Ambulatory Visit (HOSPITAL_COMMUNITY)
Admission: RE | Admit: 2023-01-31 | Discharge: 2023-01-31 | Disposition: A | Discharge status: Home / Self Care | Payer: Medicaid Other | Source: Ambulatory Visit | Attending: Hematology and Oncology | Admitting: Hematology and Oncology

## 2023-01-31 ENCOUNTER — Other Ambulatory Visit: Payer: Self-pay

## 2023-01-31 DIAGNOSIS — Z170 Estrogen receptor positive status [ER+]: Secondary | ICD-10-CM | POA: Insufficient documentation

## 2023-01-31 DIAGNOSIS — C7951 Secondary malignant neoplasm of bone: Secondary | ICD-10-CM | POA: Diagnosis present

## 2023-01-31 DIAGNOSIS — C50811 Malignant neoplasm of overlapping sites of right female breast: Secondary | ICD-10-CM | POA: Insufficient documentation

## 2023-01-31 MED ORDER — TRIAMCINOLONE ACETONIDE 0.1 % EX OINT
1.0000 | TOPICAL_OINTMENT | Freq: Two times a day (BID) | CUTANEOUS | 3 refills | Status: AC
  Filled 2023-01-31: qty 454, 90d supply, fill #0

## 2023-01-31 MED ORDER — POTASSIUM CHLORIDE ER 10 MEQ PO CPCR: 10.0000 meq | ORAL_CAPSULE | Freq: Every day | ORAL | 4 refills | Status: DC

## 2023-01-31 MED ORDER — IOHEXOL 300 MG/ML  SOLN
100.0000 mL | Freq: Once | INTRAMUSCULAR | Status: AC | PRN
  Administered 2023-01-31: 100 mL via INTRAVENOUS

## 2023-01-31 MED ORDER — MELATONIN ER 1 MG PO TBCR: 1.0000 mg | EXTENDED_RELEASE_TABLET | Freq: Every day | ORAL | 4 refills | Status: DC

## 2023-01-31 MED ORDER — CETIRIZINE HCL 10 MG PO TABS: 10.0000 mg | ORAL_TABLET | Freq: Every day | ORAL | 0 refills | Status: DC

## 2023-01-31 MED ORDER — CLOBETASOL PROPIONATE 0.05 % EX SOLN
1.0000 | Freq: Two times a day (BID) | CUTANEOUS | 2 refills | Status: DC
  Filled 2023-01-31: qty 50, 25d supply, fill #0

## 2023-01-31 MED ORDER — METHOCARBAMOL 750 MG PO TABS: 750.0000 mg | ORAL_TABLET | Freq: Three times a day (TID) | ORAL | 0 refills | Status: DC | PRN

## 2023-01-31 MED ORDER — FUROSEMIDE 20 MG PO TABS
20.0000 mg | ORAL_TABLET | Freq: Every day | ORAL | 4 refills | Status: DC
  Filled 2023-03-18: qty 90, 90d supply, fill #0
  Filled 2023-06-20: qty 90, 90d supply, fill #1
  Filled 2023-10-07: qty 30, 30d supply, fill #2
  Filled 2023-11-20: qty 30, 30d supply, fill #3

## 2023-01-31 MED ORDER — METFORMIN HCL 500 MG PO TABS
500.0000 mg | ORAL_TABLET | Freq: Every day | ORAL | 3 refills | Status: DC
  Filled 2023-01-31: qty 30, 30d supply, fill #0

## 2023-01-31 MED ORDER — IPRATROPIUM-ALBUTEROL 0.5-2.5 (3) MG/3ML IN SOLN: 3.0000 mL | Freq: Four times a day (QID) | RESPIRATORY_TRACT | 0 refills | Status: DC | PRN

## 2023-01-31 MED ORDER — HYDROXYZINE HCL 10 MG PO TABS
10.0000 mg | ORAL_TABLET | Freq: Three times a day (TID) | ORAL | 3 refills | Status: DC | PRN
  Filled 2023-01-31 – 2023-04-21 (×2): qty 90, 15d supply, fill #0

## 2023-02-01 ENCOUNTER — Other Ambulatory Visit: Payer: Self-pay

## 2023-02-01 ENCOUNTER — Other Ambulatory Visit (HOSPITAL_COMMUNITY): Payer: Self-pay

## 2023-02-01 DIAGNOSIS — C50811 Malignant neoplasm of overlapping sites of right female breast: Secondary | ICD-10-CM | POA: Diagnosis not present

## 2023-02-04 DIAGNOSIS — C50811 Malignant neoplasm of overlapping sites of right female breast: Secondary | ICD-10-CM | POA: Diagnosis not present

## 2023-02-05 ENCOUNTER — Other Ambulatory Visit: Payer: Self-pay

## 2023-02-05 DIAGNOSIS — C50811 Malignant neoplasm of overlapping sites of right female breast: Secondary | ICD-10-CM | POA: Diagnosis not present

## 2023-02-06 DIAGNOSIS — C50811 Malignant neoplasm of overlapping sites of right female breast: Secondary | ICD-10-CM | POA: Diagnosis not present

## 2023-02-07 DIAGNOSIS — C50811 Malignant neoplasm of overlapping sites of right female breast: Secondary | ICD-10-CM | POA: Diagnosis not present

## 2023-02-08 ENCOUNTER — Other Ambulatory Visit (HOSPITAL_COMMUNITY): Payer: Self-pay

## 2023-02-08 DIAGNOSIS — C50811 Malignant neoplasm of overlapping sites of right female breast: Secondary | ICD-10-CM | POA: Diagnosis not present

## 2023-02-11 DIAGNOSIS — C50811 Malignant neoplasm of overlapping sites of right female breast: Secondary | ICD-10-CM | POA: Diagnosis not present

## 2023-02-12 DIAGNOSIS — C50811 Malignant neoplasm of overlapping sites of right female breast: Secondary | ICD-10-CM | POA: Diagnosis not present

## 2023-02-13 DIAGNOSIS — C50811 Malignant neoplasm of overlapping sites of right female breast: Secondary | ICD-10-CM | POA: Diagnosis not present

## 2023-02-14 DIAGNOSIS — C50811 Malignant neoplasm of overlapping sites of right female breast: Secondary | ICD-10-CM | POA: Diagnosis not present

## 2023-02-15 ENCOUNTER — Other Ambulatory Visit (HOSPITAL_COMMUNITY): Payer: Self-pay

## 2023-02-15 ENCOUNTER — Inpatient Hospital Stay (HOSPITAL_BASED_OUTPATIENT_CLINIC_OR_DEPARTMENT_OTHER): Payer: Medicaid Other | Admitting: Adult Health

## 2023-02-15 ENCOUNTER — Encounter: Payer: Self-pay | Admitting: Adult Health

## 2023-02-15 ENCOUNTER — Inpatient Hospital Stay: Payer: Medicaid Other

## 2023-02-15 ENCOUNTER — Inpatient Hospital Stay: Payer: Medicaid Other | Attending: Adult Health

## 2023-02-15 VITALS — BP 133/69 | HR 88 | Temp 98.1°F | Resp 17 | Wt 170.2 lb

## 2023-02-15 DIAGNOSIS — C50911 Malignant neoplasm of unspecified site of right female breast: Secondary | ICD-10-CM | POA: Diagnosis not present

## 2023-02-15 DIAGNOSIS — Z170 Estrogen receptor positive status [ER+]: Secondary | ICD-10-CM

## 2023-02-15 DIAGNOSIS — C7951 Secondary malignant neoplasm of bone: Secondary | ICD-10-CM | POA: Insufficient documentation

## 2023-02-15 DIAGNOSIS — Z79899 Other long term (current) drug therapy: Secondary | ICD-10-CM | POA: Insufficient documentation

## 2023-02-15 DIAGNOSIS — Z5111 Encounter for antineoplastic chemotherapy: Secondary | ICD-10-CM | POA: Insufficient documentation

## 2023-02-15 DIAGNOSIS — C50811 Malignant neoplasm of overlapping sites of right female breast: Secondary | ICD-10-CM | POA: Diagnosis not present

## 2023-02-15 LAB — CBC WITH DIFFERENTIAL (CANCER CENTER ONLY)
Abs Immature Granulocytes: 0.02 10*3/uL (ref 0.00–0.07)
Basophils Absolute: 0.1 10*3/uL (ref 0.0–0.1)
Basophils Relative: 1 %
Eosinophils Absolute: 0.6 10*3/uL — ABNORMAL HIGH (ref 0.0–0.5)
Eosinophils Relative: 9 %
HCT: 31.9 % — ABNORMAL LOW (ref 36.0–46.0)
Hemoglobin: 9.4 g/dL — ABNORMAL LOW (ref 12.0–15.0)
Immature Granulocytes: 0 %
Lymphocytes Relative: 20 %
Lymphs Abs: 1.3 10*3/uL (ref 0.7–4.0)
MCH: 24.7 pg — ABNORMAL LOW (ref 26.0–34.0)
MCHC: 29.5 g/dL — ABNORMAL LOW (ref 30.0–36.0)
MCV: 83.9 fL (ref 80.0–100.0)
Monocytes Absolute: 0.5 10*3/uL (ref 0.1–1.0)
Monocytes Relative: 7 %
Neutro Abs: 4.2 10*3/uL (ref 1.7–7.7)
Neutrophils Relative %: 63 %
Platelet Count: 280 10*3/uL (ref 150–400)
RBC: 3.8 MIL/uL — ABNORMAL LOW (ref 3.87–5.11)
RDW: 18.3 % — ABNORMAL HIGH (ref 11.5–15.5)
WBC Count: 6.6 10*3/uL (ref 4.0–10.5)
nRBC: 0 % (ref 0.0–0.2)

## 2023-02-15 LAB — CMP (CANCER CENTER ONLY)
ALT: 10 U/L (ref 0–44)
AST: 24 U/L (ref 15–41)
Albumin: 3.7 g/dL (ref 3.5–5.0)
Alkaline Phosphatase: 105 U/L (ref 38–126)
Anion gap: 6 (ref 5–15)
BUN: 16 mg/dL (ref 8–23)
CO2: 31 mmol/L (ref 22–32)
Calcium: 9.2 mg/dL (ref 8.9–10.3)
Chloride: 103 mmol/L (ref 98–111)
Creatinine: 1.26 mg/dL — ABNORMAL HIGH (ref 0.44–1.00)
GFR, Estimated: 48 mL/min — ABNORMAL LOW (ref >60)
Glucose, Bld: 101 mg/dL — ABNORMAL HIGH (ref 70–99)
Potassium: 3.7 mmol/L (ref 3.5–5.1)
Sodium: 140 mmol/L (ref 135–145)
Total Bilirubin: 0.5 mg/dL (ref 0.3–1.2)
Total Protein: 6.4 g/dL — ABNORMAL LOW (ref 6.5–8.1)

## 2023-02-15 LAB — VITAMIN B12: Vitamin B-12: 474 pg/mL (ref 180–914)

## 2023-02-15 MED ORDER — TRAMADOL HCL 50 MG PO TABS
50.0000 mg | ORAL_TABLET | Freq: Four times a day (QID) | ORAL | 0 refills | Status: DC | PRN
  Filled 2023-02-15: qty 30, 8d supply, fill #0

## 2023-02-15 MED ORDER — DENOSUMAB 120 MG/1.7ML ~~LOC~~ SOLN
120.0000 mg | Freq: Once | SUBCUTANEOUS | Status: AC
  Administered 2023-02-15: 120 mg via SUBCUTANEOUS
  Filled 2023-02-15: qty 1.7

## 2023-02-15 MED ORDER — FULVESTRANT 250 MG/5ML IM SOSY
500.0000 mg | PREFILLED_SYRINGE | Freq: Once | INTRAMUSCULAR | Status: AC
  Administered 2023-02-15: 500 mg via INTRAMUSCULAR
  Filled 2023-02-15: qty 10

## 2023-02-15 MED ORDER — ONDANSETRON 8 MG PO TBDP
8.0000 mg | ORAL_TABLET | Freq: Three times a day (TID) | ORAL | 3 refills | Status: DC | PRN
  Filled 2023-02-15 (×2): qty 20, 7d supply, fill #0
  Filled 2023-04-13: qty 20, 7d supply, fill #1
  Filled 2023-07-05: qty 20, 7d supply, fill #2
  Filled 2023-08-12: qty 20, 7d supply, fill #3

## 2023-02-15 NOTE — Assessment & Plan Note (Signed)
Suzanne [name] is a 62 year old woman with metastatic breast cancer here today for f/u on treatment with Fulvestrant and Truqap.    She is tolerating treatment with fulvestrant and true Well.  She denies any significant issues.  Her most recent restaging scans were consistent with a good clinical response.  We spent approximately 30 minutes face-to-face discussing her medications and updating her medication list that have been changed due to pharmacy changes from [facility] to [facility]. I sent tramadol #30 into [facility] and requested they transport the medications to [facility] this afternoon.    RTC every 4 weeks for labs, f/u, and an injection.

## 2023-02-15 NOTE — Progress Notes (Signed)
[facility] Cancer Follow up:    Suzanne Rigg, NP [address] [place] [address] [place] [address]   DIAGNOSIS:  Cancer Staging  Carcinoma of breast, estrogen receptor positive, stage 4, right (HCC) Staging form: Breast, AJCC 8th Edition - Clinical: Stage IV (pM1) - Signed by Loa Socks, NP on 02/15/2023   SUMMARY OF ONCOLOGIC HISTORY: [place], [place] woman presenting 10/26/2018 with right-sided inflammatory breast cancer, stage IV, involving lungs, lymph nodes and bones, as follows:             (a) chest CT scan 10/26/2018 shows bilateral pleural effusions, possible lymphangitic spread of tumor, diffuse bony metastatic disease, and significant axillary mediastinal and hilar adenopathy             (b) bone scan 10/27/2018 is a "near SuperScan" consistent with widespread bony metastatic disease             (c) head CT with and without contrast 10/30/2018 shows no intracranial metastatic disease, multiple calvarial lesions             (d) CA-27-29 on 10/27/2018 was 1810.4   (1) pleural fluid from right thoracentesis 10/28/2018 confirms malignant cells consistent with a breast primary, strongly estrogen and progesterone receptor positive, HER-2 not amplified, with an MIB-1 of 2%   (2) anastrozole started 10/29/2018             (a) palbociclib started 11/13/2018 at 100 mg dose             (b) denosumab/xgeva started 11/13/2018              (C) palbociclib dose decreased to 75mg              (D) Palbociclib dose decreased to 75 mg every other day in 11/2021   (3) associated problems:             (a) hypoxia secondary to effusions             (b) pain from bone lesions and left humeral and vertebral compression fractures             (c) right upper extremity lymphedema             (d) poor venous access   (4) genetics testing on 12/25/2018 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 STK11, TP53, TSC1, TSC2, and VHL.  The following genes were evaluated for sequence changes only: SDHA and HOXB13 c.251G>A variant only. The [facility] Melanoma Panel analyzed the following 9 genes: BAP1 BRCA2 CDK4 CDKN2A MITF POT1 PTEN RB1 Tp53.    (5) palliative radiation to the left humerus 07/20/2019 - 07/31/2019 Site Technique Total Dose (Gy) Dose per Fx (Gy) Completed Fx Beam Energies  Humerus, Left: Ext_Lt_humerus Complex 30/30 3 10/10 6X    (6) anemia requiring transfusion:              (a) B-12 level 134 FEB 2022: B-12 injectons started              (  B) iron deficient, IV iron to start 2/6             (C) Hospitalized with upper GI bleed 05/27/2022-05/30/2022   (7) Foundation one testing showed MSS, TMB 62muts/Mb, ESR1 mutation, AKT 1 mutation   (7) Elacestrant started 04/30/2022, progression noted on imaging from May 2024  (8) Capivasertib, Fulvestrant 10/25/2022  CURRENT THERAPY: Fulvestrant; Capivasertib  INTERVAL HISTORY: Suzanne [name] 62 y.o. female returns for f/u of her metastatic breast cancer.  She is taking Capivasertib BID, (160mg ) 2 tab in am and 1 tab in PM along with Fulvestrant injections every 4 weeks.  She underwent restaging CT chest/abd/pelvis on 01/31/2023 demonstrating bilateral pleural nodularity almost completely resolved, consistent with treatment response, innumerable, ill-defined hypodense lesions throughout the liver, which are slightly diminished in size consistent with treatment response, unchanged diffuse mixed  lytic and sclerotic osseous metastatic disease throughout the included axial and appendicular skeleton, notable for a high-grade focal kyphosis involving T11 through L1.  Diminished prominence of right ovary, now generally normal possibly reflecting treatment response of an ovarian metastases.     She has many meds that are incorrect on her med list.  She has stopped zyrtec and is trying OTC allegra to see how it is.  She also needs a refill on ODT Zofran.  She prefers this tablet to the ODT medication.   She has not taken the metformin she was prescribed.  She has a log of her blood sugars over the past month and they have ranged from 80-90 weekly.  Her potassium is 3.7 today and she has only had one episode of hypokalemia, however has maintained her potassium level while taking Furosemide with potassium 10 meq daily.     She is seeing Dr. Onalee Hua in [facility] dermatology for her psoriasis.  She is undergoing a taper for her psoriasis flare which is slowly improving.   Patient Active Problem List   Diagnosis Date Noted   UGI bleed 05/27/2022   Acute prerenal azotemia 04/18/2022   Dehydration 04/18/2022   SIRS (systemic inflammatory response syndrome) (HCC) 04/18/2022   Allergic rhinitis 04/18/2022   Depression 04/18/2022   Anemia of chronic disease 04/18/2022   Bronchitis 04/18/2022   Acute bronchitis 04/17/2022   Intertrigo 01/10/2022   Bacterial vaginitis 01/10/2022   Sepsis due to cellulitis (HCC) 12/06/2021   B12 deficiency anemia 08/15/2020   Sensorineural hearing loss (SNHL) of both ears 08/07/2019   Intractable back pain 02/03/2019   Thoracic compression fracture, closed, initial encounter (HCC) 02/03/2019   Essential hypertension 02/03/2019   Genetic testing 01/27/2019   Acute pharyngitis    Bacteremia    FTT (failure to thrive) in adult    Fracture closed, humerus, shaft 01/09/2019   Pneumonia 12/25/2018   Acute respiratory disorder in immunocompromised patient (HCC) 12/25/2018    Family history of breast cancer    Family history of prostate cancer    Family history of melanoma    Family history of colon cancer    Pressure injury of skin 11/24/2018   Chronic respiratory failure with hypoxia and hypercapnia (HCC) 11/19/2018   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   Hypophosphatemia 11/19/2018   Hypocalcemia 11/19/2018   Aspiration pneumonia (HCC) 11/19/2018   SOB (shortness of breath)    Palliative care by specialist    Carcinoma of breast, estrogen receptor positive, stage 4, right (HCC) 11/16/2018   Hypoalbuminemia 11/16/2018   Pleural effusion 11/14/2018   Goals of care, counseling/discussion 11/06/2018   Malignant neoplasm of overlapping sites  of right breast in female, estrogen receptor positive (HCC) 10/31/2018   Lung metastasis 10/31/2018   Pain from bone metastases (HCC) 10/31/2018   Class 2 obesity 10/31/2018   Bone injury    Pleural effusion, bilateral    Shortness of breath    Abnormal breast finding    Hypokalemia    Breast skin changes    Macrocytic anemia    Malignant neoplasm metastatic to bone (HCC)    Anemia, B12 deficiency 10/26/2018    is allergic to ferumoxytol.  MEDICAL HISTORY: Past Medical History:  Diagnosis Date   Anemia    Cancer (HCC)    Family history of breast cancer    Family history of colon cancer    Family history of melanoma    Family history of prostate cancer    Gallstones    History of hiatal hernia    History of kidney stones    Mastitis    reports history of recurrent mastitis   Metastatic breast cancer    Obesity    Psoriasis     SURGICAL HISTORY: Past Surgical History:  Procedure Laterality Date   CYSTOSCOPY/URETEROSCOPY/HOLMIUM LASER/STENT PLACEMENT Left 10/17/2022   Procedure: CYSTOSCOPY, LEFT RETROGRADE PYELOGRAM, LEFT URETEROSCOPY, HOLMIUM LASER LITHOTRIPSY, BASKET EXTRACTION, AND LEFT URETERAL STENT PLACEMENT;  Surgeon: Rene Paci, MD;  Location: [facility];  Service: Urology;   Laterality: Left;  45 MINUTES   ESOPHAGOGASTRODUODENOSCOPY (EGD) WITH PROPOFOL N/A 05/27/2022   Procedure: ESOPHAGOGASTRODUODENOSCOPY (EGD) WITH PROPOFOL;  Surgeon: Willis Modena, MD;  Location: [facility];  Service: Gastroenterology;  Laterality: N/A;   EYE SURGERY     cataract   GLAUCOMA SURGERY  late 1990's   IR THORACENTESIS ASP PLEURAL SPACE W/IMG GUIDE  10/28/2018   IR THORACENTESIS ASP PLEURAL SPACE W/IMG GUIDE  10/29/2018   OPEN REDUCTION INTERNAL FIXATION (ORIF) DISTAL RADIAL FRACTURE Left 01/09/2019   Procedure: OPEN REDUCTION INTERNAL FIXATION (ORIF) HUMERAL FRACTURE;  Surgeon: Beverely Low, MD;  Location: [facility];  Service: Orthopedics;  Laterality: Left;    SOCIAL HISTORY: Social History   Socioeconomic History   Marital status: Widowed    Spouse name: Not on file   Number of children: 1   Years of education: Not on file   Highest education level: Not on file  Occupational History   Occupation: Visual merchandiser Programs    Comment: Self-employed  Tobacco Use   Smoking status: Never   Smokeless tobacco: Never   Tobacco comments:    second hand smoke exposure  Vaping Use   Vaping status: Never Used  Substance and Sexual Activity   Alcohol use: No   Drug use: No   Sexual activity: Not Currently  Other Topics Concern   Not on file  Social History Narrative   Has a daughter named [name]. Daughter has CP.   Social Determinants of Health   Financial Resource Strain: Low Risk  (09/17/2022)   Overall Financial Resource Strain (CARDIA)    Difficulty of Paying Living Expenses: Not hard at all  Food Insecurity: No Food Insecurity (09/17/2022)   Hunger Vital Sign    Worried About Running Out of Food in the Last Year: Never true    Ran Out of Food in the Last Year: Never true  Transportation Needs: No Transportation Needs (09/17/2022)   PRAPARE - Administrator, Civil Service (Medical): No    Lack of Transportation (Non-Medical): No  Physical  Activity: Not on file  Stress: Stress Concern Present (11/08/2021)   [employer]  of Occupational Health - Occupational Stress Questionnaire    Feeling of Stress : To some extent  Social Connections: Socially Isolated (03/27/2021)   Social Connection and Isolation Panel [NHANES]    Frequency of Communication with Friends and Family: Once a week    Frequency of Social Gatherings with Friends and Family: More than three times a week    Attends Religious Services: Never    Database administrator or Organizations: No    Attends Banker Meetings: Never    Marital Status: Widowed  Intimate Partner Violence: Not At Risk (09/17/2022)   Humiliation, Afraid, Rape, and Kick questionnaire    Fear of Current or Ex-Partner: No    Emotionally Abused: No    Physically Abused: No    Sexually Abused: No    FAMILY HISTORY: Family History  Problem Relation Age of Onset   Breast cancer Sister        in her 42's-70s   Heart disease Brother        CABG   Heart disease Brother        CABG   Skin cancer Brother        melanoma   Colon cancer Cousin    Heart attack Mother    Heart attack Father    Prostate cancer Brother     Review of Systems  Constitutional:  Negative for appetite change, chills, fatigue, fever and unexpected weight change.  HENT:   Negative for hearing loss, lump/mass and trouble swallowing.   Eyes:  Negative for eye problems and icterus.  Respiratory:  Negative for chest tightness, cough and shortness of breath.   Cardiovascular:  Negative for chest pain, leg swelling and palpitations.  Gastrointestinal:  Negative for abdominal distention, abdominal pain, constipation, diarrhea, nausea and vomiting.  Endocrine: Negative for hot flashes.  Genitourinary:  Negative for difficulty urinating.   Musculoskeletal:  Negative for arthralgias.  Skin:  Negative for itching and rash.  Neurological:  Negative for dizziness, extremity weakness, headaches and numbness.   Hematological:  Negative for adenopathy. Does not bruise/bleed easily.  Psychiatric/Behavioral:  Negative for depression. The patient is not nervous/anxious.       PHYSICAL EXAMINATION    Vitals:   02/15/23 1517  BP: 133/69  Pulse: 88  Resp: 17  Temp: 98.1 F (36.7 C)  SpO2: 100%    Physical Exam Constitutional:      General: She is not in acute distress.    Appearance: Normal appearance. She is not toxic-appearing.     Comments: Examined in wheelchair   HENT:     Head: Normocephalic and atraumatic.     Mouth/Throat:     Mouth: Mucous membranes are moist.     Pharynx: Oropharynx is clear. No oropharyngeal exudate or posterior oropharyngeal erythema.  Eyes:     General: No scleral icterus. Cardiovascular:     Rate and Rhythm: Normal rate and regular rhythm.     Pulses: Normal pulses.     Heart sounds: Normal heart sounds.  Pulmonary:     Effort: Pulmonary effort is normal.     Breath sounds: Normal breath sounds.  Abdominal:     General: Abdomen is flat. Bowel sounds are normal. There is no distension.     Palpations: Abdomen is soft.     Tenderness: There is no abdominal tenderness.  Musculoskeletal:        General: No swelling.     Cervical back: Neck supple.  Lymphadenopathy:     Cervical:  No cervical adenopathy.  Skin:    General: Skin is warm and dry.     Findings: Rash (psoriasis flare, following up with Dr. Onalee Hua) present.  Neurological:     General: No focal deficit present.     Mental Status: She is alert.  Psychiatric:        Mood and Affect: Mood normal.        Behavior: Behavior normal.     LABORATORY DATA:  CBC    Component Value Date/Time   WBC 6.6 02/15/2023 1457   WBC 5.1 10/16/2022 1301   RBC 3.80 (L) 02/15/2023 1457   HGB 9.4 (L) 02/15/2023 1457   HCT 31.9 (L) 02/15/2023 1457   PLT 280 02/15/2023 1457   MCV 83.9 02/15/2023 1457   MCH 24.7 (L) 02/15/2023 1457   MCHC 29.5 (L) 02/15/2023 1457   RDW 18.3 (H) 02/15/2023 1457    LYMPHSABS 1.3 02/15/2023 1457   MONOABS 0.5 02/15/2023 1457   EOSABS 0.6 (H) 02/15/2023 1457   BASOSABS 0.1 02/15/2023 1457    CMP     Component Value Date/Time   NA 140 02/15/2023 1457   K 3.7 02/15/2023 1457   CL 103 02/15/2023 1457   CO2 31 02/15/2023 1457   GLUCOSE 101 (H) 02/15/2023 1457   BUN 16 02/15/2023 1457   CREATININE 1.26 (H) 02/15/2023 1457   CALCIUM 9.2 02/15/2023 1457   PROT 6.4 (L) 02/15/2023 1457   ALBUMIN 3.7 02/15/2023 1457   AST 24 02/15/2023 1457   ALT 10 02/15/2023 1457   ALKPHOS 105 02/15/2023 1457   BILITOT 0.5 02/15/2023 1457   GFRNONAA 48 (L) 02/15/2023 1457   GFRAA >60 02/24/2020 1454   GFRAA >60 07/24/2019 1453    ASSESSMENT and THERAPY PLAN:   Carcinoma of breast, estrogen receptor positive, stage 4, right (HCC) Suzanne [name] is a 62 year old woman with metastatic breast cancer here today for f/u on treatment with Fulvestrant and Truqap.    She is tolerating treatment with fulvestrant and true Well.  She denies any significant issues.  Her most recent restaging scans were consistent with a good clinical response.  We spent approximately 30 minutes face-to-face discussing her medications and updating her medication list that have been changed due to pharmacy changes from [facility] to [facility]. I sent [facility]adol #30 into [facility] and requested they transport the medications to [facility] this afternoon.    RTC every 4 weeks for labs, f/u, and an injection.    All questions were answered. The patient knows to call the clinic with any problems, questions or concerns. We can certainly see the patient much sooner if necessary.  Total encounter time:45 minutes*in face-to-face visit time, chart review, lab review, care coordination, order entry, and documentation of the encounter time.  Lillard Anes, NP 02/15/23 4:03 PM Medical Oncology and Hematology [facility] [address] [place], [place] [address] Tel. [phone]    Fax.  [phone]  *Total Encounter Time as defined by the Centers for Medicare and Medicaid Services includes, in addition to the face-to-face time of a patient visit (documented in the note above) non-face-to-face time: obtaining and reviewing outside history, ordering and reviewing medications, tests or procedures, care coordination (communications with other health care professionals or caregivers) and documentation in the medical record.

## 2023-02-16 LAB — CANCER ANTIGEN 27.29: CA 27.29: 214.3 U/mL — ABNORMAL HIGH (ref 0.0–38.6)

## 2023-02-19 DIAGNOSIS — C50811 Malignant neoplasm of overlapping sites of right female breast: Secondary | ICD-10-CM | POA: Diagnosis not present

## 2023-02-20 DIAGNOSIS — C50811 Malignant neoplasm of overlapping sites of right female breast: Secondary | ICD-10-CM | POA: Diagnosis not present

## 2023-02-21 DIAGNOSIS — C50811 Malignant neoplasm of overlapping sites of right female breast: Secondary | ICD-10-CM | POA: Diagnosis not present

## 2023-02-23 ENCOUNTER — Encounter: Payer: Self-pay | Admitting: Adult Health

## 2023-02-25 DIAGNOSIS — C50811 Malignant neoplasm of overlapping sites of right female breast: Secondary | ICD-10-CM | POA: Diagnosis not present

## 2023-02-26 DIAGNOSIS — C50811 Malignant neoplasm of overlapping sites of right female breast: Secondary | ICD-10-CM | POA: Diagnosis not present

## 2023-02-26 DIAGNOSIS — C7801 Secondary malignant neoplasm of right lung: Secondary | ICD-10-CM | POA: Diagnosis not present

## 2023-02-26 DIAGNOSIS — R0602 Shortness of breath: Secondary | ICD-10-CM | POA: Diagnosis not present

## 2023-02-26 DIAGNOSIS — S22000A Wedge compression fracture of unspecified thoracic vertebra, initial encounter for closed fracture: Secondary | ICD-10-CM | POA: Diagnosis not present

## 2023-02-26 DIAGNOSIS — J9621 Acute and chronic respiratory failure with hypoxia: Secondary | ICD-10-CM | POA: Diagnosis not present

## 2023-02-27 ENCOUNTER — Encounter: Payer: Self-pay | Admitting: Hematology and Oncology

## 2023-02-27 ENCOUNTER — Telehealth: Payer: Self-pay | Admitting: *Deleted

## 2023-02-27 DIAGNOSIS — C50811 Malignant neoplasm of overlapping sites of right female breast: Secondary | ICD-10-CM | POA: Diagnosis not present
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
## 2023-02-28 ENCOUNTER — Other Ambulatory Visit: Payer: Self-pay

## 2023-02-28 DIAGNOSIS — C50811 Malignant neoplasm of overlapping sites of right female breast: Secondary | ICD-10-CM | POA: Diagnosis not present

## 2023-03-01 DIAGNOSIS — C50811 Malignant neoplasm of overlapping sites of right female breast: Secondary | ICD-10-CM | POA: Diagnosis not present

## 2023-03-04 DIAGNOSIS — C50811 Malignant neoplasm of overlapping sites of right female breast: Secondary | ICD-10-CM | POA: Diagnosis not present

## 2023-03-05 ENCOUNTER — Telehealth: Payer: Self-pay | Admitting: Nurse Practitioner

## 2023-03-05 DIAGNOSIS — C50811 Malignant neoplasm of overlapping sites of right female breast: Secondary | ICD-10-CM | POA: Diagnosis not present

## 2023-03-05 NOTE — Telephone Encounter (Signed)
Copied from CRM [id number]. Topic: General - Inquiry >> Mar 05, 2023  2:30 PM Haroldine Laws wrote: Reason for CRM: pt called asking [facility] sent over papers for a home health aid reauthorization  Cb [phone]

## 2023-03-06 ENCOUNTER — Ambulatory Visit: Payer: Medicaid Other | Attending: Nurse Practitioner | Admitting: Nurse Practitioner

## 2023-03-06 ENCOUNTER — Encounter: Payer: Self-pay | Admitting: Nurse Practitioner

## 2023-03-06 VITALS — BP 135/82 | HR 103 | Ht 59.0 in | Wt 172.0 lb

## 2023-03-06 DIAGNOSIS — C7951 Secondary malignant neoplasm of bone: Secondary | ICD-10-CM

## 2023-03-06 DIAGNOSIS — Z0289 Encounter for other administrative examinations: Secondary | ICD-10-CM | POA: Diagnosis not present

## 2023-03-06 DIAGNOSIS — C50811 Malignant neoplasm of overlapping sites of right female breast: Secondary | ICD-10-CM | POA: Diagnosis not present

## 2023-03-06 DIAGNOSIS — G893 Neoplasm related pain (acute) (chronic): Secondary | ICD-10-CM

## 2023-03-06 NOTE — Telephone Encounter (Signed)
Message sent through Mychart

## 2023-03-06 NOTE — Progress Notes (Signed)
Assessment & Plan:  Suzanne [name] was seen today for pcs.  Diagnoses and all orders for this visit:  Pain from bone metastases  Continue follow up with Hematology   Encounter for completion of form with patient Awaiting forms to be faxed to our office for completion of paperwork   Patient has been counseled on age-appropriate routine health concerns for screening and prevention. These are reviewed and up-to-date. Referrals have been placed accordingly. Immunizations are up-to-date or declined.    Subjective:   Chief Complaint  Patient presents with   PCS    Personal care services form renewal    HPI Suzanne [name] 62 y.o. female presents to office today requesting re certification for PCS in the home.   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       Suzanne [name] has limited mobility in her left shoulder which prevents her from completing her ADLs and IADLs. She also has bone metastasis which tends to limit her mobility and ability to care for herself due to pain. She receives PCS services in the home and requires help with bathing, washing, grooming, toileting and household chores.    Review of Systems  Constitutional:  Positive for malaise/fatigue. Negative for fever and weight loss.  HENT: Negative.  Negative for nosebleeds.   Eyes: Negative.  Negative for blurred vision, double vision and photophobia.  Respiratory: Negative.  Negative for cough and shortness of breath.   Cardiovascular: Negative.  Negative for chest pain, palpitations and leg swelling.  Gastrointestinal: Negative.  Negative for heartburn, nausea and vomiting.  Musculoskeletal:  Positive for joint pain and myalgias.  Neurological:  Positive for weakness. Negative for dizziness, focal weakness, seizures and headaches.  Psychiatric/Behavioral: Negative.   Negative for suicidal ideas.     Past Medical History:  Diagnosis Date   Anemia    Cancer (HCC)    Family history of breast cancer    Family history of colon cancer    Family history of melanoma    Family history of prostate cancer    Gallstones    History of hiatal hernia    History of kidney stones    Mastitis    reports history of recurrent mastitis   Metastatic breast cancer    Obesity    Psoriasis     Past Surgical History:  Procedure Laterality Date   CYSTOSCOPY/URETEROSCOPY/HOLMIUM LASER/STENT PLACEMENT Left 10/17/2022   Procedure: CYSTOSCOPY, LEFT RETROGRADE PYELOGRAM, LEFT URETEROSCOPY, HOLMIUM LASER LITHOTRIPSY, BASKET EXTRACTION, AND LEFT URETERAL STENT PLACEMENT;  Surgeon: Suzanne Paci, MD;  Location: [facility];  Service: Urology;  Laterality: Left;  45 MINUTES   ESOPHAGOGASTRODUODENOSCOPY (EGD) WITH PROPOFOL N/A 05/27/2022   Procedure: ESOPHAGOGASTRODUODENOSCOPY (EGD) WITH PROPOFOL;  Surgeon: Suzanne Modena, MD;  Location: [facility];  Service: Gastroenterology;  Laterality: N/A;   EYE SURGERY     cataract   GLAUCOMA SURGERY  late 1990's   IR THORACENTESIS ASP PLEURAL SPACE W/IMG GUIDE  10/28/2018   IR THORACENTESIS ASP PLEURAL SPACE W/IMG GUIDE  10/29/2018   OPEN REDUCTION INTERNAL FIXATION (ORIF) DISTAL RADIAL FRACTURE Left 01/09/2019   Procedure: OPEN REDUCTION INTERNAL FIXATION (ORIF) HUMERAL FRACTURE;  Surgeon: Beverely Low, MD;  Location: [facility];  Service: Orthopedics;  Laterality: Left;    Family History  Problem Relation Age of Onset   Breast cancer Sister  in her 60's-70s   Heart disease Brother        CABG   Heart disease Brother        CABG   Skin cancer Brother        melanoma   Colon cancer Cousin    Heart attack Mother    Heart attack Father    Prostate cancer Brother     Social History Reviewed with no changes to be made today.   Outpatient Medications Prior to Visit  Medication Sig Dispense Refill   acetaminophen  (TYLENOL) 500 MG tablet Take 1,000 mg by mouth every 6 (six) hours as needed for mild pain.     capivasertib (TRUQAP) 160 MG tablet Take2 tabs in am and 1 tab in pm  for 4 days, then hold for 3 days. Repeat every 7 days. 64 tablet 1   clobetasol (TEMOVATE) 0.05 % external solution Apply 1 Application topically 2 (two) times daily. 50 mL 2   Denosumab (XGEVA [place]) Inject 1 Dose into the skin every 30 (thirty) days.     famotidine (PEPCID) 20 MG tablet Take 1 tablet (20 mg total) by mouth 2 (two) times daily as needed for heartburn or indigestion. 30 tablet 3   furosemide (LASIX) 20 MG tablet Take 1 tablet (20 mg total) by mouth daily. 90 tablet 4   hydrOXYzine (ATARAX) 10 MG tablet Take 1-2 tablets (10-20 mg total) by mouth 3 (three) times daily as needed. 90 tablet 3   ipratropium-albuterol (DUONEB) 0.5-2.5 (3) MG/3ML SOLN Take 3 mLs by nebulization every 6 (six) hours as needed. 360 mL 0   loperamide (IMODIUM) 2 MG capsule Take 2 mg by mouth daily as needed for diarrhea or loose stools.     ondansetron (ZOFRAN-ODT) 8 MG disintegrating tablet Dissolve 1 tablet (8 mg total) by mouth every 8 (eight) hours as needed for nausea. 20 tablet 3   OYSTER SHELL CALCIUM PLUS D 500-5 MG-MCG TABS TAKE ONE TABLET BY MOUTH EVERY MORNING and TAKE ONE TABLET BY MOUTH AT NOON and TAKE ONE TABLET BY MOUTH EVERY EVENING (Patient taking differently: Take 1 tablet by mouth 2 (two) times daily.) 90 tablet 1   pantoprazole (PROTONIX) 40 MG tablet Take 1 tablet (40 mg total) by mouth daily. 90 tablet 3   potassium chloride (MICRO-K) 10 MEQ CR capsule Take 1 capsule (10 mEq total) by mouth daily. 90 capsule 4   tacrolimus (PROTOPIC) 0.1 % ointment Apply topically 2 (two) times daily. Apply to affected areas of psoriasis on the days not using triamcinolone. 100 g 0   traMADol (ULTRAM) 50 MG tablet Take 1 tablet (50 mg total) by mouth every 6 (six) hours as needed for moderate pain or severe pain. 30 tablet 0   triamcinolone  ointment (KENALOG) 0.1 % Apply 1 Application topically 2 (two) times daily to affected areas of psoriasis for 2 weeks, then take 2 week break before restarting. 453 g 3   Apremilast (OTEZLA) 30 MG TABS Take 1 tablet (30 mg total) by mouth 2 (two) times daily. (Patient not taking: Reported on 03/06/2023) 60 tablet 11   Melatonin ER (MELATONIN TR) 1 MG TBCR Take 1 mg by mouth at bedtime. (Patient not taking: Reported on 03/06/2023) 90 tablet 4   methocarbamol (ROBAXIN) 750 MG tablet Take 1 tablet (750 mg total) by mouth every 8 (eight) hours as needed for muscle spasms. (Patient not taking: Reported on 03/06/2023) 10 tablet 0   oxybutynin (DITROPAN) 5 MG tablet Take 1  tablet (5 mg total) by mouth every 8 (eight) hours as needed for bladder spasms. (Patient not taking: Reported on 03/06/2023) 30 tablet 1   No facility-administered medications prior to visit.    Allergies  Allergen Reactions   Ferumoxytol Other (See Comments)    Pt had hypersensitivity to Ferumoxytol. She had Facial Flushing. See note from 07/14/2021       Objective:    BP 135/82   Pulse (!) 103   Ht 4\' 11"  (1.499 m)   Wt 172 lb (78 kg)   LMP 05/28/2013 (Within Months)   SpO2 99%   BMI 34.74 kg/m  Wt Readings from Last 3 Encounters:  03/06/23 172 lb (78 kg)  02/15/23 170 lb 3.2 oz (77.2 kg)  01/17/23 175 lb 9.6 oz (79.7 kg)    Physical Exam Vitals and nursing note reviewed.  Constitutional:      Appearance: She is well-developed.  HENT:     Head: Normocephalic and atraumatic.  Cardiovascular:     Rate and Rhythm: Normal rate and regular rhythm.     Heart sounds: Normal heart sounds. No murmur heard.    No friction rub. No gallop.  Pulmonary:     Effort: Pulmonary effort is normal. No tachypnea or respiratory distress.     Breath sounds: Normal breath sounds. No decreased breath sounds, wheezing, rhonchi or rales.  Chest:     Chest wall: No tenderness.  Abdominal:     General: Bowel sounds are normal.      Palpations: Abdomen is soft.  Musculoskeletal:        General: Normal range of motion.     Cervical back: Normal range of motion.  Skin:    General: Skin is warm and dry.  Neurological:     Mental Status: She is alert and oriented to person, place, and time.     Coordination: Coordination normal.  Psychiatric:        Behavior: Behavior normal. Behavior is cooperative.        Thought Content: Thought content normal.        Judgment: Judgment normal.          Patient has been counseled extensively about nutrition and exercise as well as the importance of adherence with medications and regular follow-up. The patient was given clear instructions to go to ER or return to medical center if symptoms don't improve, worsen or new problems develop. The patient verbalized understanding.   Follow-up: Return in about 6 months (around 09/04/2023).   [name], FNP-BC [facility] [place], [place] [phone]   03/06/2023, 4:52 PM

## 2023-03-07 ENCOUNTER — Other Ambulatory Visit: Payer: Self-pay

## 2023-03-07 DIAGNOSIS — C50811 Malignant neoplasm of overlapping sites of right female breast: Secondary | ICD-10-CM | POA: Diagnosis not present

## 2023-03-07 NOTE — Progress Notes (Signed)
Specialty Pharmacy Refill Coordination Note  Suzanne [name] is a 62 y.o. female contacted today regarding refills of specialty medication(s) Capivasertib   Patient requested Delivery   Delivery date: 03/18/23   Verified address: [address], [place], [address]   Medication will be filled on 03/15/23.

## 2023-03-08 DIAGNOSIS — C50811 Malignant neoplasm of overlapping sites of right female breast: Secondary | ICD-10-CM | POA: Diagnosis not present

## 2023-03-11 ENCOUNTER — Encounter: Payer: Self-pay | Admitting: Hematology and Oncology

## 2023-03-11 DIAGNOSIS — C50811 Malignant neoplasm of overlapping sites of right female breast: Secondary | ICD-10-CM | POA: Diagnosis not present

## 2023-03-12 ENCOUNTER — Telehealth: Payer: Self-pay

## 2023-03-12 ENCOUNTER — Telehealth (INDEPENDENT_AMBULATORY_CARE_PROVIDER_SITE_OTHER): Payer: Self-pay | Admitting: Nurse Practitioner

## 2023-03-12 DIAGNOSIS — C50811 Malignant neoplasm of overlapping sites of right female breast: Secondary | ICD-10-CM | POA: Diagnosis not present

## 2023-03-12 NOTE — Telephone Encounter (Signed)
Copied from CRM [id number]. Topic: General - Other >> Mar 12, 2023  1:10 PM [place] B wrote: Reason for CRM: Aundra Millet from [facility], called in asking if form was received that was sent yesterday, a 3051 form for independent assessment for pca services?

## 2023-03-12 NOTE — Telephone Encounter (Signed)
Pt called and asked about paperwork for [facility]. She states Vikki Ports was to fill out paperwork and fax to them but the rep at [facility] denies seeing any completed ppw.   Marua will call back if she needs further assistance from [place].

## 2023-03-13 DIAGNOSIS — C50811 Malignant neoplasm of overlapping sites of right female breast: Secondary | ICD-10-CM | POA: Diagnosis not present

## 2023-03-14 ENCOUNTER — Inpatient Hospital Stay: Payer: Medicaid Other

## 2023-03-14 ENCOUNTER — Encounter: Payer: Self-pay | Admitting: Hematology and Oncology

## 2023-03-14 ENCOUNTER — Inpatient Hospital Stay (HOSPITAL_BASED_OUTPATIENT_CLINIC_OR_DEPARTMENT_OTHER): Payer: Medicaid Other | Admitting: Hematology and Oncology

## 2023-03-14 ENCOUNTER — Inpatient Hospital Stay: Payer: Medicaid Other | Attending: Adult Health

## 2023-03-14 VITALS — BP 152/83 | HR 74 | Temp 97.7°F | Resp 18 | Wt 173.6 lb

## 2023-03-14 DIAGNOSIS — C779 Secondary and unspecified malignant neoplasm of lymph node, unspecified: Secondary | ICD-10-CM | POA: Insufficient documentation

## 2023-03-14 DIAGNOSIS — C7951 Secondary malignant neoplasm of bone: Secondary | ICD-10-CM | POA: Insufficient documentation

## 2023-03-14 DIAGNOSIS — Z5111 Encounter for antineoplastic chemotherapy: Secondary | ICD-10-CM | POA: Insufficient documentation

## 2023-03-14 DIAGNOSIS — Z170 Estrogen receptor positive status [ER+]: Secondary | ICD-10-CM

## 2023-03-14 DIAGNOSIS — Z79899 Other long term (current) drug therapy: Secondary | ICD-10-CM | POA: Diagnosis not present

## 2023-03-14 DIAGNOSIS — C7800 Secondary malignant neoplasm of unspecified lung: Secondary | ICD-10-CM | POA: Insufficient documentation

## 2023-03-14 DIAGNOSIS — C50811 Malignant neoplasm of overlapping sites of right female breast: Secondary | ICD-10-CM | POA: Insufficient documentation

## 2023-03-14 LAB — CBC WITH DIFFERENTIAL (CANCER CENTER ONLY)
Abs Immature Granulocytes: 0.02 10*3/uL (ref 0.00–0.07)
Basophils Absolute: 0.1 10*3/uL (ref 0.0–0.1)
Basophils Relative: 1 %
Eosinophils Absolute: 0.4 10*3/uL (ref 0.0–0.5)
Eosinophils Relative: 6 %
HCT: 31.1 % — ABNORMAL LOW (ref 36.0–46.0)
Hemoglobin: 9.4 g/dL — ABNORMAL LOW (ref 12.0–15.0)
Immature Granulocytes: 0 %
Lymphocytes Relative: 22 %
Lymphs Abs: 1.5 10*3/uL (ref 0.7–4.0)
MCH: 24.8 pg — ABNORMAL LOW (ref 26.0–34.0)
MCHC: 30.2 g/dL (ref 30.0–36.0)
MCV: 82.1 fL (ref 80.0–100.0)
Monocytes Absolute: 0.5 10*3/uL (ref 0.1–1.0)
Monocytes Relative: 7 %
Neutro Abs: 4.3 10*3/uL (ref 1.7–7.7)
Neutrophils Relative %: 64 %
Platelet Count: 292 10*3/uL (ref 150–400)
RBC: 3.79 MIL/uL — ABNORMAL LOW (ref 3.87–5.11)
RDW: 18 % — ABNORMAL HIGH (ref 11.5–15.5)
WBC Count: 6.8 10*3/uL (ref 4.0–10.5)
nRBC: 0 % (ref 0.0–0.2)

## 2023-03-14 LAB — CMP (CANCER CENTER ONLY)
ALT: 12 U/L (ref 0–44)
AST: 21 U/L (ref 15–41)
Albumin: 3.9 g/dL (ref 3.5–5.0)
Alkaline Phosphatase: 102 U/L (ref 38–126)
Anion gap: 7 (ref 5–15)
BUN: 19 mg/dL (ref 8–23)
CO2: 29 mmol/L (ref 22–32)
Calcium: 9.6 mg/dL (ref 8.9–10.3)
Chloride: 107 mmol/L (ref 98–111)
Creatinine: 1.11 mg/dL — ABNORMAL HIGH (ref 0.44–1.00)
GFR, Estimated: 56 mL/min — ABNORMAL LOW (ref >60)
Glucose, Bld: 121 mg/dL — ABNORMAL HIGH (ref 70–99)
Potassium: 3.8 mmol/L (ref 3.5–5.1)
Sodium: 143 mmol/L (ref 135–145)
Total Bilirubin: 0.5 mg/dL (ref 0.3–1.2)
Total Protein: 6.8 g/dL (ref 6.5–8.1)

## 2023-03-14 LAB — VITAMIN B12: Vitamin B-12: 372 pg/mL (ref 180–914)

## 2023-03-14 MED ORDER — FULVESTRANT 250 MG/5ML IM SOSY
500.0000 mg | PREFILLED_SYRINGE | Freq: Once | INTRAMUSCULAR | Status: AC
  Administered 2023-03-14: 500 mg via INTRAMUSCULAR
  Filled 2023-03-14: qty 10

## 2023-03-14 MED ORDER — DENOSUMAB 120 MG/1.7ML ~~LOC~~ SOLN
120.0000 mg | Freq: Once | SUBCUTANEOUS | Status: AC
  Administered 2023-03-14: 120 mg via SUBCUTANEOUS
  Filled 2023-03-14: qty 1.7

## 2023-03-14 NOTE — Assessment & Plan Note (Signed)
Suzanne [name] is a 62 year old woman with metastatic breast cancer currently on treatment with Truqap and faslodex.  Breast Cancer Patient is tolerating Truqap and Faslodex well.  She had excellent clinical response and radiological response so far. -Continue Truqap 2 tabs in the morning and 1 at night for 4 days on, hold for 3 days. -Continue Faslodex as prescribed. -Repeat scans in December.  Arm Pain Patient experienced severe arm pain with a mobile lump that has since resolved. Pain was managed with Tramadol and Biofreeze. -If pain or lump returns, patient to notify the office.  Psoriasis Flaring up again, but better than it was. 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 -Continue current treatment and monitor. -Continue to work with [employer] for approval of [place].  General Health Maintenance -Check blood pressure at home before drinking coffee. -Follow up appointment scheduled.

## 2023-03-14 NOTE — Progress Notes (Signed)
[facility] Center Cancer Follow up:    Suzanne Rigg, NP [address] [place] [address]   DIAGNOSIS:  Cancer Staging  Carcinoma of breast, estrogen receptor positive, stage 4, right (HCC) Staging form: Breast, AJCC 8th Edition - Clinical: Stage IV (pM1) - Signed by Loa Socks, NP on 02/15/2023   SUMMARY OF ONCOLOGIC HISTORY: [place], [place] woman presenting 10/26/2018 with right-sided inflammatory breast cancer, stage IV, involving lungs, lymph nodes and bones, as follows:             (a) chest CT scan 10/26/2018 shows bilateral pleural effusions, possible lymphangitic spread of tumor, diffuse bony metastatic disease, and significant axillary mediastinal and hilar adenopathy             (b) bone scan 10/27/2018 is a "near SuperScan" consistent with widespread bony metastatic disease             (c) head CT with and without contrast 10/30/2018 shows no intracranial metastatic disease, multiple calvarial lesions             (d) CA-27-29 on 10/27/2018 was 1810.4   (1) pleural fluid from right thoracentesis 10/28/2018 confirms malignant cells consistent with a breast primary, strongly estrogen and progesterone receptor positive, HER-2 not amplified, with an MIB-1 of 2%   (2) anastrozole started 10/29/2018             (a) palbociclib started 11/13/2018 at 100 mg dose             (b) denosumab/xgeva started 11/13/2018              (C) palbociclib dose decreased to 75mg              (D) Palbociclib dose decreased to 75 mg every other day in 11/2021   (3) associated problems:             (a) hypoxia secondary to effusions             (b) pain from bone lesions and left humeral and vertebral compression fractures             (c) right upper extremity lymphedema             (d) poor venous access   (4) genetics testing on 12/25/2018 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 STK11, TP53, TSC1, TSC2, and VHL.  The following genes were evaluated for sequence changes only: SDHA and HOXB13 c.251G>A variant only. The [facility] Melanoma Panel analyzed the following 9 genes: BAP1 BRCA2 CDK4 CDKN2A MITF POT1 PTEN RB1 Tp53.    (5) palliative radiation to the left humerus 07/20/2019 - 07/31/2019 Site Technique Total Dose (Gy) Dose per Fx (Gy) Completed Fx Beam Energies  Humerus, Left: Ext_Lt_humerus Complex 30/30 3 10/10 6X    (6) anemia requiring transfusion:              (a) B-12 level 134 FEB 2022: B-12 injectons started              (  B) iron deficient, IV iron to start 2/6             (C) Hospitalized with upper GI bleed 05/27/2022-05/30/2022   (7) Foundation one testing showed MSS, TMB 83muts/Mb, ESR1 mutation, AKT 1 mutation   (7) Elacestrant started 04/30/2022, progression noted on imaging from May 2024  (8) Capivasertib, Fulvestrant 10/25/2022  CURRENT THERAPY: Fulvestrant; Capivasertib  INTERVAL HISTORY: Suzanne [name] 62 y.o. female returns for f/u of her metastatic breast cancer.  She is taking Capivasertib BID, (160mg ) 2 tab in am and 1 tab in PM along with Fulvestrant injections every 4 weeks.  She underwent restaging CT chest/abd/pelvis on 01/31/2023 demonstrating bilateral pleural nodularity almost completely resolved, consistent with treatment response, innumerable, ill-defined hypodense lesions throughout the liver, which are slightly diminished in size consistent with treatment response, unchanged diffuse mixed  lytic and sclerotic osseous metastatic disease throughout the included axial and appendicular skeleton, notable for a high-grade focal kyphosis involving T11 through L1.  Diminished prominence of right ovary, now generally normal possibly reflecting treatment response of an ovarian metastases.    She recently experienced severe arm pain, associated with a mobile lump. The pain was severe, rating it as an eight or nine out of ten. The pain and lump have since resolved on her own. The patient believes the pain may have been triggered by weather changes. The patient also reports a flare-up of her psoriasis. The patient has a history of a broken bone, which was repaired with a plate and screws on the same side as pain, so could be arthritic pain.   She otherwise is tolerating capivasertib extremely well, no diarrhea, no other complaints at all.  She gets Faslodex every month and Xgeva every month.  No new dental concerns reported.  Patient Active Problem List   Diagnosis Date Noted   UGI bleed 05/27/2022   Acute prerenal azotemia 04/18/2022   Dehydration 04/18/2022   SIRS (systemic inflammatory response syndrome) (HCC) 04/18/2022   Allergic rhinitis 04/18/2022   Depression 04/18/2022   Anemia of chronic disease 04/18/2022   Bronchitis 04/18/2022   Acute bronchitis 04/17/2022   Intertrigo 01/10/2022   Bacterial vaginitis 01/10/2022   Sepsis due to cellulitis (HCC) 12/06/2021   B12 deficiency anemia 08/15/2020   Sensorineural hearing loss (SNHL) of both ears 08/07/2019   Intractable back pain 02/03/2019   Thoracic compression fracture, closed, initial encounter (HCC) 02/03/2019   Essential hypertension 02/03/2019   Genetic testing 01/27/2019   Acute pharyngitis    Bacteremia    FTT (failure to thrive) in adult    Fracture closed, humerus, shaft 01/09/2019   Pneumonia 12/25/2018   Acute respiratory disorder in immunocompromised patient (HCC) 12/25/2018   Family history of breast cancer     Family history of prostate cancer    Family history of melanoma    Family history of colon cancer    Pressure injury of skin 11/24/2018   Chronic respiratory failure with hypoxia and hypercapnia (HCC) 11/19/2018   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   Hypophosphatemia 11/19/2018   Hypocalcemia 11/19/2018   Aspiration pneumonia (HCC) 11/19/2018   SOB (shortness of breath)    Palliative care by specialist    Carcinoma of breast, estrogen receptor positive, stage 4, right (HCC) 11/16/2018   Hypoalbuminemia 11/16/2018   Pleural effusion 11/14/2018   Goals of care, counseling/discussion 11/06/2018   Malignant neoplasm of overlapping sites of right breast in female, estrogen receptor positive (HCC) 10/31/2018   Malignant neoplasm metastatic to lung (HCC) 10/31/2018  Pain from bone metastases (HCC) 10/31/2018   Class 2 obesity 10/31/2018   Bone injury    Pleural effusion, bilateral    Shortness of breath    Abnormal breast finding    Hypokalemia    Breast skin changes    Macrocytic anemia    Malignant neoplasm metastatic to bone (HCC)    Anemia, B12 deficiency 10/26/2018    is allergic to ferumoxytol.  MEDICAL HISTORY: Past Medical History:  Diagnosis Date   Anemia    Cancer (HCC)    Family history of breast cancer    Family history of colon cancer    Family history of melanoma    Family history of prostate cancer    Gallstones    History of hiatal hernia    History of kidney stones    Mastitis    reports history of recurrent mastitis   Metastatic breast cancer    Obesity    Psoriasis     SURGICAL HISTORY: Past Surgical History:  Procedure Laterality Date   CYSTOSCOPY/URETEROSCOPY/HOLMIUM LASER/STENT PLACEMENT Left 10/17/2022   Procedure: CYSTOSCOPY, LEFT RETROGRADE PYELOGRAM, LEFT URETEROSCOPY, HOLMIUM LASER LITHOTRIPSY, BASKET EXTRACTION, AND LEFT URETERAL STENT PLACEMENT;  Surgeon: Rene Paci, MD;  Location: [facility];  Service: Urology;  Laterality:  Left;  45 MINUTES   ESOPHAGOGASTRODUODENOSCOPY (EGD) WITH PROPOFOL N/A 05/27/2022   Procedure: ESOPHAGOGASTRODUODENOSCOPY (EGD) WITH PROPOFOL;  Surgeon: Willis Modena, MD;  Location: [facility];  Service: Gastroenterology;  Laterality: N/A;   EYE SURGERY     cataract   GLAUCOMA SURGERY  late 1990's   IR THORACENTESIS ASP PLEURAL SPACE W/IMG GUIDE  10/28/2018   IR THORACENTESIS ASP PLEURAL SPACE W/IMG GUIDE  10/29/2018   OPEN REDUCTION INTERNAL FIXATION (ORIF) DISTAL RADIAL FRACTURE Left 01/09/2019   Procedure: OPEN REDUCTION INTERNAL FIXATION (ORIF) HUMERAL FRACTURE;  Surgeon: Beverely Low, MD;  Location: [facility];  Service: Orthopedics;  Laterality: Left;    SOCIAL HISTORY: Social History   Socioeconomic History   Marital status: Widowed    Spouse name: Not on file   Number of children: 1   Years of education: Not on file   Highest education level: Not on file  Occupational History   Occupation: Visual merchandiser Programs    Comment: Self-employed  Tobacco Use   Smoking status: Never   Smokeless tobacco: Never   Tobacco comments:    second hand smoke exposure  Vaping Use   Vaping status: Never Used  Substance and Sexual Activity   Alcohol use: No   Drug use: No   Sexual activity: Not Currently  Other Topics Concern   Not on file  Social History Narrative   Has a daughter named [name]. Daughter has CP.   Social Determinants of Health   Financial Resource Strain: Low Risk  (09/17/2022)   Overall Financial Resource Strain (CARDIA)    Difficulty of Paying Living Expenses: Not hard at all  Food Insecurity: No Food Insecurity (09/17/2022)   Hunger Vital Sign    Worried About Running Out of Food in the Last Year: Never true    Ran Out of Food in the Last Year: Never true  Transportation Needs: No Transportation Needs (09/17/2022)   PRAPARE - Administrator, Civil Service (Medical): No    Lack of Transportation (Non-Medical): No  Physical Activity: Inactive  (03/07/2023)   Exercise Vital Sign    Days of Exercise per Week: 0 days    Minutes of Exercise per Session: 0 min  Stress: Stress  Concern Present (03/07/2023)   [employer] of Occupational Health - Occupational Stress Questionnaire    Feeling of Stress : To some extent  Social Connections: Socially Isolated (03/07/2023)   Social Connection and Isolation Panel [NHANES]    Frequency of Communication with Friends and Family: Once a week    Frequency of Social Gatherings with Friends and Family: Once a week    Attends Religious Services: Never    Database administrator or Organizations: Yes    Attends Banker Meetings: Never    Marital Status: Widowed  Intimate Partner Violence: Not At Risk (09/17/2022)   Humiliation, Afraid, Rape, and Kick questionnaire    Fear of Current or Ex-Partner: No    Emotionally Abused: No    Physically Abused: No    Sexually Abused: No    FAMILY HISTORY: Family History  Problem Relation Age of Onset   Breast cancer Sister        in her 41's-70s   Heart disease Brother        CABG   Heart disease Brother        CABG   Skin cancer Brother        melanoma   Colon cancer Cousin    Heart attack Mother    Heart attack Father    Prostate cancer Brother     Review of Systems  Constitutional:  Negative for appetite change, chills, fatigue, fever and unexpected weight change.  HENT:   Negative for hearing loss, lump/mass and trouble swallowing.   Eyes:  Negative for eye problems and icterus.  Respiratory:  Negative for chest tightness, cough and shortness of breath.   Cardiovascular:  Negative for chest pain, leg swelling and palpitations.  Gastrointestinal:  Negative for abdominal distention, abdominal pain, constipation, diarrhea, nausea and vomiting.  Endocrine: Negative for hot flashes.  Genitourinary:  Negative for difficulty urinating.   Musculoskeletal:  Negative for arthralgias.  Skin:  Negative for itching and rash.   Neurological:  Negative for dizziness, extremity weakness, headaches and numbness.  Hematological:  Negative for adenopathy. Does not bruise/bleed easily.  Psychiatric/Behavioral:  Negative for depression. The patient is not nervous/anxious.       PHYSICAL EXAMINATION    Vitals:   03/14/23 1445  BP: (!) 152/83  Pulse: 74  Resp: 18  Temp: 97.7 F (36.5 C)  SpO2: 100%    Physical Exam Constitutional:      General: She is not in acute distress.    Appearance: Normal appearance. She is not toxic-appearing.     Comments: Examined in wheelchair   HENT:     Head: Normocephalic and atraumatic.     Mouth/Throat:     Mouth: Mucous membranes are moist.     Pharynx: Oropharynx is clear. No oropharyngeal exudate or posterior oropharyngeal erythema.  Eyes:     General: No scleral icterus. Cardiovascular:     Rate and Rhythm: Normal rate and regular rhythm.     Pulses: Normal pulses.     Heart sounds: Normal heart sounds.  Pulmonary:     Effort: Pulmonary effort is normal.     Breath sounds: Normal breath sounds.  Abdominal:     General: Abdomen is flat. Bowel sounds are normal. There is no distension.     Palpations: Abdomen is soft.     Tenderness: There is no abdominal tenderness.  Musculoskeletal:        General: No swelling.     Cervical back: Neck supple.  Lymphadenopathy:  Cervical: No cervical adenopathy.  Skin:    General: Skin is warm and dry.     Findings: Rash (psoriasis flare, following up with Dr. Onalee Hua) present.  Neurological:     General: No focal deficit present.     Mental Status: She is alert.  Psychiatric:        Mood and Affect: Mood normal.        Behavior: Behavior normal.     LABORATORY DATA:  CBC    Component Value Date/Time   WBC 6.8 03/14/2023 1418   WBC 5.1 10/16/2022 1301   RBC 3.79 (L) 03/14/2023 1418   HGB 9.4 (L) 03/14/2023 1418   HCT 31.1 (L) 03/14/2023 1418   PLT 292 03/14/2023 1418   MCV 82.1 03/14/2023 1418   MCH 24.8  (L) 03/14/2023 1418   MCHC 30.2 03/14/2023 1418   RDW 18.0 (H) 03/14/2023 1418   LYMPHSABS 1.5 03/14/2023 1418   MONOABS 0.5 03/14/2023 1418   EOSABS 0.4 03/14/2023 1418   BASOSABS 0.1 03/14/2023 1418    CMP     Component Value Date/Time   NA 143 03/14/2023 1418   K 3.8 03/14/2023 1418   CL 107 03/14/2023 1418   CO2 29 03/14/2023 1418   GLUCOSE 121 (H) 03/14/2023 1418   BUN 19 03/14/2023 1418   CREATININE 1.11 (H) 03/14/2023 1418   CALCIUM 9.6 03/14/2023 1418   PROT 6.8 03/14/2023 1418   ALBUMIN 3.9 03/14/2023 1418   AST 21 03/14/2023 1418   ALT 12 03/14/2023 1418   ALKPHOS 102 03/14/2023 1418   BILITOT 0.5 03/14/2023 1418   GFRNONAA 56 (L) 03/14/2023 1418   GFRAA >60 02/24/2020 1454   GFRAA >60 07/24/2019 1453    ASSESSMENT and THERAPY PLAN:   Malignant neoplasm metastatic to bone [facility]) Suzanne [name] is a 62 year old woman with metastatic breast cancer currently on treatment with Truqap and faslodex.  Breast Cancer Patient is tolerating Truqap and Faslodex well.  She had excellent clinical response and radiological response so far. -Continue Truqap 2 tabs in the morning and 1 at night for 4 days on, hold for 3 days. -Continue Faslodex as prescribed. -Repeat scans in December.  Arm Pain Patient experienced severe arm pain with a mobile lump that has since resolved. Pain was managed with Tramadol and Biofreeze. -If pain or lump returns, patient to notify the office.  Psoriasis Flaring up again, but better than it was. 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 -Continue current treatment and monitor. -Continue to work with [employer] for approval of [place].  General Health Maintenance -Check blood pressure at home before drinking coffee. -Follow up appointment scheduled.     All questions were answered. The patient knows to call the clinic with any problems, questions or concerns. We can certainly see the patient much sooner if  necessary.  Total encounter time:30 minutes*in face-to-face visit time, chart review, lab review, care coordination, order entry, and documentation of the encounter time.   *Total Encounter Time as defined by the Centers for Medicare and Medicaid Services includes, in addition to the face-to-face time of a patient visit (documented in the note above) non-face-to-face time: obtaining and reviewing outside history, ordering and reviewing medications, tests or procedures, care coordination (communications with other health care professionals or caregivers) and documentation in the medical record.

## 2023-03-14 NOTE — Telephone Encounter (Signed)
No forms have been received via fax.

## 2023-03-15 DIAGNOSIS — C50811 Malignant neoplasm of overlapping sites of right female breast: Secondary | ICD-10-CM | POA: Diagnosis not present

## 2023-03-15 LAB — CANCER ANTIGEN 27.29: CA 27.29: 243.5 U/mL — ABNORMAL HIGH (ref 0.0–38.6)

## 2023-03-15 NOTE — Telephone Encounter (Signed)
Spoke with Meghan At [facility] Advised that the [facility] forms have been received and we are in process of completing and obtaining provider signature.

## 2023-03-15 NOTE — Telephone Encounter (Signed)
Suzanne [name] from [facility] is calling to f/u on fax sent to office 10/14 and 10/15. Form 3051: Independent Assessment for Personal Care Services. She stated she never received a callback from anyone. Has faxed form twice (10/14) and then again (10/15).  Suzanne [name] asked if there is a different way she can get this to the office. Is requesting a callback as soon as possible. Will be resending it again today.  Please advise.

## 2023-03-18 ENCOUNTER — Other Ambulatory Visit: Payer: Self-pay

## 2023-03-18 ENCOUNTER — Other Ambulatory Visit (HOSPITAL_COMMUNITY): Payer: Self-pay

## 2023-03-18 DIAGNOSIS — C50811 Malignant neoplasm of overlapping sites of right female breast: Secondary | ICD-10-CM | POA: Diagnosis not present

## 2023-03-19 DIAGNOSIS — C50811 Malignant neoplasm of overlapping sites of right female breast: Secondary | ICD-10-CM | POA: Diagnosis not present

## 2023-03-20 DIAGNOSIS — C50811 Malignant neoplasm of overlapping sites of right female breast: Secondary | ICD-10-CM | POA: Diagnosis not present

## 2023-03-21 DIAGNOSIS — C50811 Malignant neoplasm of overlapping sites of right female breast: Secondary | ICD-10-CM | POA: Diagnosis not present

## 2023-03-22 DIAGNOSIS — C50811 Malignant neoplasm of overlapping sites of right female breast: Secondary | ICD-10-CM | POA: Diagnosis not present

## 2023-03-25 DIAGNOSIS — C50811 Malignant neoplasm of overlapping sites of right female breast: Secondary | ICD-10-CM | POA: Diagnosis not present

## 2023-03-27 DIAGNOSIS — C50811 Malignant neoplasm of overlapping sites of right female breast: Secondary | ICD-10-CM | POA: Diagnosis not present

## 2023-03-28 DIAGNOSIS — C50811 Malignant neoplasm of overlapping sites of right female breast: Secondary | ICD-10-CM | POA: Diagnosis not present

## 2023-03-29 DIAGNOSIS — C7801 Secondary malignant neoplasm of right lung: Secondary | ICD-10-CM | POA: Diagnosis not present

## 2023-03-29 DIAGNOSIS — C50811 Malignant neoplasm of overlapping sites of right female breast: Secondary | ICD-10-CM | POA: Diagnosis not present

## 2023-03-29 DIAGNOSIS — R0602 Shortness of breath: Secondary | ICD-10-CM | POA: Diagnosis not present

## 2023-03-29 DIAGNOSIS — S22000A Wedge compression fracture of unspecified thoracic vertebra, initial encounter for closed fracture: Secondary | ICD-10-CM | POA: Diagnosis not present

## 2023-03-29 DIAGNOSIS — J9621 Acute and chronic respiratory failure with hypoxia: Secondary | ICD-10-CM | POA: Diagnosis not present

## 2023-04-01 DIAGNOSIS — C7951 Secondary malignant neoplasm of bone: Secondary | ICD-10-CM | POA: Diagnosis not present

## 2023-04-02 DIAGNOSIS — C7951 Secondary malignant neoplasm of bone: Secondary | ICD-10-CM | POA: Diagnosis not present

## 2023-04-03 DIAGNOSIS — C7951 Secondary malignant neoplasm of bone: Secondary | ICD-10-CM | POA: Diagnosis not present

## 2023-04-04 DIAGNOSIS — C7951 Secondary malignant neoplasm of bone: Secondary | ICD-10-CM | POA: Diagnosis not present

## 2023-04-05 DIAGNOSIS — C7951 Secondary malignant neoplasm of bone: Secondary | ICD-10-CM | POA: Diagnosis not present

## 2023-04-08 DIAGNOSIS — C7951 Secondary malignant neoplasm of bone: Secondary | ICD-10-CM | POA: Diagnosis not present

## 2023-04-09 DIAGNOSIS — C7951 Secondary malignant neoplasm of bone: Secondary | ICD-10-CM | POA: Diagnosis not present

## 2023-04-10 DIAGNOSIS — C7951 Secondary malignant neoplasm of bone: Secondary | ICD-10-CM | POA: Diagnosis not present

## 2023-04-11 DIAGNOSIS — C7951 Secondary malignant neoplasm of bone: Secondary | ICD-10-CM | POA: Diagnosis not present

## 2023-04-12 ENCOUNTER — Inpatient Hospital Stay: Payer: Medicaid Other

## 2023-04-12 ENCOUNTER — Other Ambulatory Visit: Payer: Medicaid Other

## 2023-04-12 ENCOUNTER — Inpatient Hospital Stay: Payer: Medicaid Other | Attending: Adult Health | Admitting: Adult Health

## 2023-04-12 VITALS — BP 141/72 | HR 87 | Temp 98.1°F | Wt 176.9 lb

## 2023-04-12 DIAGNOSIS — C7951 Secondary malignant neoplasm of bone: Secondary | ICD-10-CM

## 2023-04-12 DIAGNOSIS — Z170 Estrogen receptor positive status [ER+]: Secondary | ICD-10-CM | POA: Diagnosis not present

## 2023-04-12 DIAGNOSIS — C778 Secondary and unspecified malignant neoplasm of lymph nodes of multiple regions: Secondary | ICD-10-CM | POA: Diagnosis not present

## 2023-04-12 DIAGNOSIS — C7801 Secondary malignant neoplasm of right lung: Secondary | ICD-10-CM | POA: Diagnosis not present

## 2023-04-12 DIAGNOSIS — C7802 Secondary malignant neoplasm of left lung: Secondary | ICD-10-CM | POA: Diagnosis not present

## 2023-04-12 DIAGNOSIS — C50811 Malignant neoplasm of overlapping sites of right female breast: Secondary | ICD-10-CM

## 2023-04-12 DIAGNOSIS — C50911 Malignant neoplasm of unspecified site of right female breast: Secondary | ICD-10-CM

## 2023-04-12 DIAGNOSIS — C7800 Secondary malignant neoplasm of unspecified lung: Secondary | ICD-10-CM | POA: Insufficient documentation

## 2023-04-12 DIAGNOSIS — Z79899 Other long term (current) drug therapy: Secondary | ICD-10-CM | POA: Diagnosis not present

## 2023-04-12 DIAGNOSIS — Z5111 Encounter for antineoplastic chemotherapy: Secondary | ICD-10-CM | POA: Diagnosis present

## 2023-04-12 LAB — CMP (CANCER CENTER ONLY)
ALT: 10 U/L (ref 0–44)
AST: 19 U/L (ref 15–41)
Albumin: 3.6 g/dL (ref 3.5–5.0)
Alkaline Phosphatase: 112 U/L (ref 38–126)
Anion gap: 3 — ABNORMAL LOW (ref 5–15)
BUN: 17 mg/dL (ref 8–23)
CO2: 32 mmol/L (ref 22–32)
Calcium: 9.2 mg/dL (ref 8.9–10.3)
Chloride: 105 mmol/L (ref 98–111)
Creatinine: 1.1 mg/dL — ABNORMAL HIGH (ref 0.44–1.00)
GFR, Estimated: 56 mL/min — ABNORMAL LOW (ref >60)
Glucose, Bld: 99 mg/dL (ref 70–99)
Potassium: 3.7 mmol/L (ref 3.5–5.1)
Sodium: 140 mmol/L (ref 135–145)
Total Bilirubin: 0.5 mg/dL (ref <1.2)
Total Protein: 6.6 g/dL (ref 6.5–8.1)

## 2023-04-12 LAB — CBC WITH DIFFERENTIAL (CANCER CENTER ONLY)
Abs Immature Granulocytes: 0.01 10*3/uL (ref 0.00–0.07)
Basophils Absolute: 0.1 10*3/uL (ref 0.0–0.1)
Basophils Relative: 1 %
Eosinophils Absolute: 0.5 10*3/uL (ref 0.0–0.5)
Eosinophils Relative: 8 %
HCT: 29.5 % — ABNORMAL LOW (ref 36.0–46.0)
Hemoglobin: 8.7 g/dL — ABNORMAL LOW (ref 12.0–15.0)
Immature Granulocytes: 0 %
Lymphocytes Relative: 20 %
Lymphs Abs: 1.2 10*3/uL (ref 0.7–4.0)
MCH: 24.2 pg — ABNORMAL LOW (ref 26.0–34.0)
MCHC: 29.5 g/dL — ABNORMAL LOW (ref 30.0–36.0)
MCV: 82.2 fL (ref 80.0–100.0)
Monocytes Absolute: 0.4 10*3/uL (ref 0.1–1.0)
Monocytes Relative: 7 %
Neutro Abs: 3.9 10*3/uL (ref 1.7–7.7)
Neutrophils Relative %: 64 %
Platelet Count: 291 10*3/uL (ref 150–400)
RBC: 3.59 MIL/uL — ABNORMAL LOW (ref 3.87–5.11)
RDW: 17.5 % — ABNORMAL HIGH (ref 11.5–15.5)
WBC Count: 6 10*3/uL (ref 4.0–10.5)
nRBC: 0 % (ref 0.0–0.2)

## 2023-04-12 LAB — VITAMIN B12: Vitamin B-12: 261 pg/mL (ref 180–914)

## 2023-04-12 MED ORDER — FULVESTRANT 250 MG/5ML IM SOSY
500.0000 mg | PREFILLED_SYRINGE | Freq: Once | INTRAMUSCULAR | Status: AC
  Administered 2023-04-12: 500 mg via INTRAMUSCULAR
  Filled 2023-04-12: qty 10

## 2023-04-12 MED ORDER — DENOSUMAB 120 MG/1.7ML ~~LOC~~ SOLN
120.0000 mg | Freq: Once | SUBCUTANEOUS | Status: DC
  Filled 2023-04-12: qty 1.7

## 2023-04-12 NOTE — Progress Notes (Unsigned)
[facility] Cancer Follow up:    Suzanne Rigg, NP [address] [place] [address] [place] [address]   DIAGNOSIS: Cancer Staging  Carcinoma of breast, estrogen receptor positive, stage 4, right (HCC) Staging form: Breast, AJCC 8th Edition - Clinical: Stage IV (pM1) - Signed by Loa Socks, NP on 02/15/2023   SUMMARY OF ONCOLOGIC HISTORY: Suzanne [name], [place] woman presenting 10/26/2018 with right-sided inflammatory breast cancer, stage IV, involving lungs, lymph nodes and bones, as follows:             (a) chest CT scan 10/26/2018 shows bilateral pleural effusions, possible lymphangitic spread of tumor, diffuse bony metastatic disease, and significant axillary mediastinal and hilar adenopathy             (b) bone scan 10/27/2018 is a "near SuperScan" consistent with widespread bony metastatic disease             (c) head CT with and without contrast 10/30/2018 shows no intracranial metastatic disease, multiple calvarial lesions             (d) CA-27-29 on 10/27/2018 was 1810.4   (1) pleural fluid from right thoracentesis 10/28/2018 confirms malignant cells consistent with a breast primary, strongly estrogen and progesterone receptor positive, HER-2 not amplified, with an MIB-1 of 2%   (2) anastrozole started 10/29/2018             (a) palbociclib started 11/13/2018 at 100 mg dose             (b) denosumab/xgeva started 11/13/2018              (C) palbociclib dose decreased to 75mg              (D) Palbociclib dose decreased to 75 mg every other day in 11/2021   (3) associated problems:             (a) hypoxia secondary to effusions             (b) pain from bone lesions and left humeral and vertebral compression fractures             (c) right upper extremity lymphedema             (d) poor venous access   (4) genetics testing on 12/25/2018 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 STK11, TP53, TSC1, TSC2, and VHL.  The following genes were evaluated for sequence changes only: SDHA and HOXB13 c.251G>A variant only. The [facility] Melanoma Panel analyzed the following 9 genes: BAP1 BRCA2 CDK4 CDKN2A MITF POT1 PTEN RB1 Tp53.    (5) palliative radiation to the left humerus 07/20/2019 - 07/31/2019 Site Technique Total Dose (Gy) Dose per Fx (Gy) Completed Fx Beam Energies  Humerus, Left: Ext_Lt_humerus Complex 30/30 3 10/10 6X    (6) anemia requiring transfusion:              (a) B-12 level 134 FEB 2022: B-12 injectons started              (  B) iron deficient, IV iron to start 2/6             (C) Hospitalized with upper GI bleed 05/27/2022-05/30/2022   (7) Foundation one testing showed MSS, TMB 27muts/Mb, ESR1 mutation, AKT 1 mutation    (7) Elacestrant started 04/30/2022, progression noted on imaging from May 2024   (8) Capivasertib, Fulvestrant 10/25/2022  CURRENT THERAPY: Fulvestrant, Capivasertib  INTERVAL HISTORY: Suzanne [name] 63 y.o. female returns for    Patient Active Problem List   Diagnosis Date Noted   UGI bleed 05/27/2022   Acute prerenal azotemia 04/18/2022   Dehydration 04/18/2022   SIRS (systemic inflammatory response syndrome) (HCC) 04/18/2022   Allergic rhinitis 04/18/2022   Depression 04/18/2022   Anemia of chronic disease 04/18/2022   Bronchitis 04/18/2022   Acute bronchitis 04/17/2022   Intertrigo 01/10/2022   Bacterial vaginitis 01/10/2022   Sepsis due to cellulitis (HCC) 12/06/2021   B12  deficiency anemia 08/15/2020   Sensorineural hearing loss (SNHL) of both ears 08/07/2019   Intractable back pain 02/03/2019   Thoracic compression fracture, closed, initial encounter (HCC) 02/03/2019   Essential hypertension 02/03/2019   Genetic testing 01/27/2019   Acute pharyngitis    Bacteremia    FTT (failure to thrive) in adult    Fracture closed, humerus, shaft 01/09/2019   Pneumonia 12/25/2018   Acute respiratory disorder in immunocompromised patient (HCC) 12/25/2018   Family history of breast cancer    Family history of prostate cancer    Family history of melanoma    Family history of colon cancer    Pressure injury of skin 11/24/2018   Chronic respiratory failure with hypoxia and hypercapnia (HCC) 11/19/2018   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   Hypophosphatemia 11/19/2018   Hypocalcemia 11/19/2018   Aspiration pneumonia (HCC) 11/19/2018   SOB (shortness of breath)    Palliative care by specialist    Carcinoma of breast, estrogen receptor positive, stage 4, right (HCC) 11/16/2018   Hypoalbuminemia 11/16/2018   Pleural effusion 11/14/2018   Goals of care, counseling/discussion 11/06/2018   Malignant neoplasm of overlapping sites of right breast in female, estrogen receptor positive (HCC) 10/31/2018   Malignant neoplasm metastatic to lung (HCC) 10/31/2018   Pain from bone metastases (HCC) 10/31/2018   Class 2 obesity 10/31/2018   Bone injury    Pleural effusion, bilateral    Shortness of breath    Abnormal breast finding    Hypokalemia    Breast skin changes    Macrocytic anemia    Malignant neoplasm metastatic to bone (HCC)    Anemia, B12 deficiency 10/26/2018    is allergic to ferumoxytol.  MEDICAL HISTORY: Past Medical History:  Diagnosis Date   Anemia    Cancer (HCC)    Family history of breast cancer    Family history of colon cancer    Family history of melanoma    Family history of prostate cancer    Gallstones    History of hiatal hernia     History of kidney stones    Mastitis    reports history of recurrent mastitis   Metastatic breast cancer    Obesity    Psoriasis     SURGICAL HISTORY: Past Surgical History:  Procedure Laterality Date   CYSTOSCOPY/URETEROSCOPY/HOLMIUM LASER/STENT PLACEMENT Left 10/17/2022   Procedure: CYSTOSCOPY, LEFT RETROGRADE PYELOGRAM, LEFT URETEROSCOPY, HOLMIUM LASER LITHOTRIPSY, BASKET EXTRACTION, AND LEFT URETERAL STENT PLACEMENT;  Surgeon: Rene Paci, MD;  Location: [facility];  Service: Urology;  Laterality: Left;  45 MINUTES  ESOPHAGOGASTRODUODENOSCOPY (EGD) WITH PROPOFOL N/A 05/27/2022   Procedure: ESOPHAGOGASTRODUODENOSCOPY (EGD) WITH PROPOFOL;  Surgeon: Willis Modena, MD;  Location: [facility];  Service: Gastroenterology;  Laterality: N/A;   EYE SURGERY     cataract   GLAUCOMA SURGERY  late 1990's   IR THORACENTESIS ASP PLEURAL SPACE W/IMG GUIDE  10/28/2018   IR THORACENTESIS ASP PLEURAL SPACE W/IMG GUIDE  10/29/2018   OPEN REDUCTION INTERNAL FIXATION (ORIF) DISTAL RADIAL FRACTURE Left 01/09/2019   Procedure: OPEN REDUCTION INTERNAL FIXATION (ORIF) HUMERAL FRACTURE;  Surgeon: Beverely Low, MD;  Location: [facility];  Service: Orthopedics;  Laterality: Left;    SOCIAL HISTORY: Social History   Socioeconomic History   Marital status: Widowed    Spouse name: Not on file   Number of children: 1   Years of education: Not on file   Highest education level: Not on file  Occupational History   Occupation: Visual merchandiser Programs    Comment: Self-employed  Tobacco Use   Smoking status: Never   Smokeless tobacco: Never   Tobacco comments:    second hand smoke exposure  Vaping Use   Vaping status: Never Used  Substance and Sexual Activity   Alcohol use: No   Drug use: No   Sexual activity: Not Currently  Other Topics Concern   Not on file  Social History Narrative   Has a daughter named [name]. Daughter has CP.   Social Determinants of Health   Financial  Resource Strain: Low Risk  (09/17/2022)   Overall Financial Resource Strain (CARDIA)    Difficulty of Paying Living Expenses: Not hard at all  Food Insecurity: No Food Insecurity (09/17/2022)   Hunger Vital Sign    Worried About Running Out of Food in the Last Year: Never true    Ran Out of Food in the Last Year: Never true  Transportation Needs: No Transportation Needs (09/17/2022)   PRAPARE - Administrator, Civil Service (Medical): No    Lack of Transportation (Non-Medical): No  Physical Activity: Inactive (03/07/2023)   Exercise Vital Sign    Days of Exercise per Week: 0 days    Minutes of Exercise per Session: 0 min  Stress: Stress Concern Present (03/07/2023)   [employer] of Occupational Health - Occupational Stress Questionnaire    Feeling of Stress : To some extent  Social Connections: Socially Isolated (03/07/2023)   Social Connection and Isolation Panel [NHANES]    Frequency of Communication with Friends and Family: Once a week    Frequency of Social Gatherings with Friends and Family: Once a week    Attends Religious Services: Never    Database administrator or Organizations: Yes    Attends Banker Meetings: Never    Marital Status: Widowed  Intimate Partner Violence: Not At Risk (09/17/2022)   Humiliation, Afraid, Rape, and Kick questionnaire    Fear of Current or Ex-Partner: No    Emotionally Abused: No    Physically Abused: No    Sexually Abused: No    FAMILY HISTORY: Family History  Problem Relation Age of Onset   Breast cancer Sister        in her 68's-70s   Heart disease Brother        CABG   Heart disease Brother        CABG   Skin cancer Brother        melanoma   Colon cancer Cousin    Heart attack Mother    Heart attack Father  Prostate cancer Brother     Review of Systems - Oncology    PHYSICAL EXAMINATION    There were no vitals filed for this visit.  Physical Exam  LABORATORY DATA:  CBC    Component  Value Date/Time   WBC 6.0 04/12/2023 1319   WBC 5.1 10/16/2022 1301   RBC 3.59 (L) 04/12/2023 1319   HGB 8.7 (L) 04/12/2023 1319   HCT 29.5 (L) 04/12/2023 1319   PLT 291 04/12/2023 1319   MCV 82.2 04/12/2023 1319   MCH 24.2 (L) 04/12/2023 1319   MCHC 29.5 (L) 04/12/2023 1319   RDW 17.5 (H) 04/12/2023 1319   LYMPHSABS 1.2 04/12/2023 1319   MONOABS 0.4 04/12/2023 1319   EOSABS 0.5 04/12/2023 1319   BASOSABS 0.1 04/12/2023 1319    CMP     Component Value Date/Time   NA 143 03/14/2023 1418   K 3.8 03/14/2023 1418   CL 107 03/14/2023 1418   CO2 29 03/14/2023 1418   GLUCOSE 121 (H) 03/14/2023 1418   BUN 19 03/14/2023 1418   CREATININE 1.11 (H) 03/14/2023 1418   CALCIUM 9.6 03/14/2023 1418   PROT 6.8 03/14/2023 1418   ALBUMIN 3.9 03/14/2023 1418   AST 21 03/14/2023 1418   ALT 12 03/14/2023 1418   ALKPHOS 102 03/14/2023 1418   BILITOT 0.5 03/14/2023 1418   GFRNONAA 56 (L) 03/14/2023 1418   GFRAA >60 02/24/2020 1454   GFRAA >60 07/24/2019 1453       PENDING LABS:   RADIOGRAPHIC STUDIES:  No results found.   PATHOLOGY:     ASSESSMENT and THERAPY PLAN:   No problem-specific Assessment & Plan notes found for this encounter.   No orders of the defined types were placed in this encounter.   All questions were answered. The patient knows to call the clinic with any problems, questions or concerns. We can certainly see the patient much sooner if necessary. This note was electronically signed. Noreene Filbert, NP 04/12/2023

## 2023-04-13 ENCOUNTER — Other Ambulatory Visit (HOSPITAL_COMMUNITY): Payer: Self-pay

## 2023-04-13 LAB — CANCER ANTIGEN 27.29: CA 27.29: 261.5 U/mL — ABNORMAL HIGH (ref 0.0–38.6)

## 2023-04-14 ENCOUNTER — Encounter: Payer: Self-pay | Admitting: Hematology and Oncology

## 2023-04-14 NOTE — Assessment & Plan Note (Signed)
Suzanne [name] is a 62 year old woman with metastatic breast cancer here today for f/u on treatment with Fulvestrant and Truqap.    Metastatic Breast Cancer No signs of progression. Tolerating Fulvestrant and TruCapt well. Some nausea, but unclear if related to TruCapt or GI issues. -Continue Fulvestrant and TruCapt as prescribed. -CT scan scheduled for December 2nd, 2024.  Psoriasis Aggravated by TruCapt. [place] being used for management. Jethro Bastos as prescribed.  Dental Health Broken tooth noted, may need extraction. -Hold Xgeva due to risk of osteonecrosis of the jaw if tooth extraction is needed. -Schedule dental appointment for evaluation of broken tooth.  Anemia Hemoglobin 8.7, patient appears pale at night. -Monitor hemoglobin levels closely.  Vitamin D Insufficiency Level at 300, which is considered insufficient. -Monitor Vitamin D levels. -Continue supplementation.   General Health Maintenance -Next appointment in four weeks. -Report dental appointment outcome at next visit. -If tooth extraction is needed, discuss holding Xgeva again.

## 2023-04-15 ENCOUNTER — Encounter: Payer: Self-pay | Admitting: Adult Health

## 2023-04-15 DIAGNOSIS — C7951 Secondary malignant neoplasm of bone: Secondary | ICD-10-CM | POA: Diagnosis not present

## 2023-04-16 ENCOUNTER — Ambulatory Visit (INDEPENDENT_AMBULATORY_CARE_PROVIDER_SITE_OTHER): Payer: Medicaid Other | Admitting: Dermatology

## 2023-04-16 ENCOUNTER — Encounter: Payer: Self-pay | Admitting: Dermatology

## 2023-04-16 DIAGNOSIS — L409 Psoriasis, unspecified: Secondary | ICD-10-CM | POA: Diagnosis not present

## 2023-04-16 DIAGNOSIS — Z79899 Other long term (current) drug therapy: Secondary | ICD-10-CM

## 2023-04-16 DIAGNOSIS — C7951 Secondary malignant neoplasm of bone: Secondary | ICD-10-CM | POA: Diagnosis not present

## 2023-04-16 NOTE — Progress Notes (Addendum)
   Follow-Up Visit   Subjective  Suzanne [name] is a 62 y.o. female who presents for the following: psoriasis  Patient present today for follow up visit for follow up. Patient was last evaluated on 01/14/23. Patient reports sxs are better. She stated that she has been using the recommended CeraVe anti-itch, DHS Zinc Shampoo and Rx'd Clobetasol  Scalp Solution. She mentioned that she has been approved for Otezla  by her insurance on yesterday. Patient reports medication changes. Pt stated that she will be adding on B12 supplements.   The following portions of the chart were reviewed this encounter and updated as appropriate: medications, allergies, medical history  Review of Systems:  No other skin or systemic complaints except as noted in HPI or Assessment and Plan.  Objective  Well appearing patient in no apparent distress; mood and affect are within normal limits.   A focused examination was performed of the following areas:   Relevant exam findings are noted in the Assessment and Plan.             Assessment & Plan   PSORIASIS Exam: Well-demarcated erythematous papules/plaques with silvery scale, guttate pink scaly papules. 10% BSA.  - Assessment: Significant improvement noted with BSA reduction from 30% to 10%. - Plan: Continue Otezla  30 mg daily, with an anticipated treatment duration of at least one year. Alternate between topical triamcinolone  applied twice daily for two weeks and tacrolimus  applied twice daily for two weeks, with refills available. Advise the use of Metcove, Vaseline, and Aquaphor spray as needed for moisturization. Consider Roughness Relief with urea for exfoliation before applying Aquaphor spray. Follow up in 3 months or sooner if any issues arise.  -Continue application of topical creams (triamcinolone  and tacrolimus ), with assistance as needed. Use Vaseline on top of the medicine and apply Aquaphor spray and Roughness Relief as needed.    Pt is not a  candidate for methotrexate, acitretin or cyclosporine due to inability for follow up labs and contraindications with other medications. Pt has no access to a light box for phototherapy.  Due to the progressive and chronic nature of her psoriasis, patient has tried and failed numerous topicals creams as noted above, the next best therapeutic option is a systemic therapy. It is medically necessary to help improve her quality of life.    Side effects of Otezla  (apremilast ) include diarrhea, nausea, headache, upper respiratory infection, depression, and weight decrease (5-10%). It should only be taken by pregnant women after a discussion regarding risks and benefits with their doctor. Goal is control of skin condition, not cure.  The use of Otezla  requires long term medication management, including periodic office visits.   No follow-ups on file.    Documentation: I have reviewed the above documentation for accuracy and completeness, and I agree with the above.  I, Shirron Maranda, CMA, am acting as scribe for [employer], DO.   [name] Lenis, DO

## 2023-04-16 NOTE — Patient Instructions (Addendum)
Hello Suzanne [name],  Thank you for visiting [place] today. We are excited about the progress you've made with your Psoriasis.   Here is a summary of the essential instructions from today's consultation:  - Medications:   - Otezla: Continue with Otezla 30 mg daily. Utilize the starter pack to combine 10, 20, and 30 mg tablets to reach the 30 mg dosage.   - Triamcinolone Cream: Apply twice daily for two weeks, then transition to tacrolimus cream, applying it twice daily for another two weeks. Ensure consistent application on the upper back and other affected areas.   - Moisturization: Use Vaseline to lock in moisture post-shower. For additional moisturization and exfoliation, Aquaphor spray and Roughness Relief with urea are recommended.  - Follow-Up Appointments:   - Schedule your next dermatology appointment in three months.   - Additional Recommendations:   - Verify you have an adequate supply of medications until our next meeting. Additional starter packs have been provided to ensure a two-week supply.   - Should you encounter any issues or have questions, please reach out to [place] via MyTrip.  We send our best wishes for your forthcoming scans and eagerly anticipate witnessing further improvements in your health. Wishing you a joyful holiday season!  Warm regards,  Dr. [facility]      Important Information  Due to recent changes in healthcare laws, you may see results of your pathology and/or laboratory studies on MyChart before the doctors have had a chance to review them. We understand that in some cases there may be results that are confusing or concerning to you. Please understand that not all results are received at the same time and often the doctors may need to interpret multiple results in order to provide you with the best plan of care or course of treatment. Therefore, we ask that you please give [place] 2 business days to thoroughly review all your results before  contacting the office for clarification. Should we see a critical lab result, you will be contacted sooner.   If You Need Anything After Your Visit  If you have any questions or concerns for your doctor, please call our main line at [phone] If no one answers, please leave a voicemail as directed and we will return your call as soon as possible. Messages left after 4 pm will be answered the following business day.   You may also send [place] a message via MyChart. We typically respond to MyChart messages within 1-2 business days.  For prescription refills, please ask your pharmacy to contact our office. Our fax number is [phone].  If you have an urgent issue when the clinic is closed that cannot wait until the next business day, you can page your doctor at the number below.    Please note that while we do our best to be available for urgent issues outside of office hours, we are not available 24/7.   If you have an urgent issue and are unable to reach [place], you may choose to seek medical care at your doctor's office, retail clinic, urgent care center, or emergency room.  If you have a medical emergency, please immediately call 911 or go to the emergency department. In the event of inclement weather, please call our main line at [phone] for an update on the status of any delays or closures.  Dermatology Medication Tips: Please keep the boxes that topical medications come in in order to help keep track of the instructions about where and  how to use these. Pharmacies typically print the medication instructions only on the boxes and not directly on the medication tubes.   If your medication is too expensive, please contact our office at [phone] or send [place] a message through MyChart.   We are unable to tell what your co-pay for medications will be in advance as this is different depending on your insurance coverage. However, we may be able to find a substitute medication at lower cost  or fill out paperwork to get insurance to cover a needed medication.   If a prior authorization is required to get your medication covered by your insurance company, please allow [place] 1-2 business days to complete this process.  Drug prices often vary depending on where the prescription is filled and some pharmacies may offer cheaper prices.  The website www.goodrx.com contains coupons for medications through different pharmacies. The prices here do not account for what the cost may be with help from insurance (it may be cheaper with your insurance), but the website can give you the price if you did not use any insurance.  - You can print the associated coupon and take it with your prescription to the pharmacy.  - You may also stop by our office during regular business hours and pick up a GoodRx coupon card.  - If you need your prescription sent electronically to a different pharmacy, notify our office through [facility] or by phone at [phone]

## 2023-04-17 ENCOUNTER — Other Ambulatory Visit: Payer: Self-pay

## 2023-04-18 DIAGNOSIS — C7951 Secondary malignant neoplasm of bone: Secondary | ICD-10-CM | POA: Diagnosis not present

## 2023-04-19 ENCOUNTER — Other Ambulatory Visit: Payer: Self-pay

## 2023-04-19 ENCOUNTER — Other Ambulatory Visit: Payer: Self-pay | Admitting: Hematology and Oncology

## 2023-04-19 ENCOUNTER — Other Ambulatory Visit (HOSPITAL_COMMUNITY): Payer: Self-pay

## 2023-04-19 DIAGNOSIS — C7951 Secondary malignant neoplasm of bone: Secondary | ICD-10-CM

## 2023-04-19 DIAGNOSIS — K259 Gastric ulcer, unspecified as acute or chronic, without hemorrhage or perforation: Secondary | ICD-10-CM | POA: Diagnosis not present

## 2023-04-19 DIAGNOSIS — K921 Melena: Secondary | ICD-10-CM | POA: Diagnosis not present

## 2023-04-19 MED ORDER — CAPIVASERTIB 160 MG PO TABS
ORAL_TABLET | ORAL | 1 refills | Status: DC
  Filled 2023-04-19: qty 64, 34d supply, fill #0
  Filled 2023-05-30 (×2): qty 64, 34d supply, fill #1

## 2023-04-19 NOTE — Progress Notes (Signed)
Specialty Pharmacy Refill Coordination Note  Suzanne [name] is a 62 y.o. female contacted today regarding refills of specialty medication(s) No data recorded  Patient requested Delivery   Delivery date: 04/29/23   Verified address: [address] [place], [place] [address]   Medication will be filled on 04/26/23. Left patient a voicemail stating that we sent a refill request and will call back if any delays.

## 2023-04-22 ENCOUNTER — Other Ambulatory Visit: Payer: Self-pay

## 2023-04-22 DIAGNOSIS — C7951 Secondary malignant neoplasm of bone: Secondary | ICD-10-CM | POA: Diagnosis not present

## 2023-04-23 DIAGNOSIS — C7951 Secondary malignant neoplasm of bone: Secondary | ICD-10-CM | POA: Diagnosis not present

## 2023-04-24 DIAGNOSIS — C7951 Secondary malignant neoplasm of bone: Secondary | ICD-10-CM | POA: Diagnosis not present

## 2023-04-25 DIAGNOSIS — C7951 Secondary malignant neoplasm of bone: Secondary | ICD-10-CM | POA: Diagnosis not present

## 2023-04-26 DIAGNOSIS — C7951 Secondary malignant neoplasm of bone: Secondary | ICD-10-CM | POA: Diagnosis not present

## 2023-04-28 DIAGNOSIS — C7801 Secondary malignant neoplasm of right lung: Secondary | ICD-10-CM | POA: Diagnosis not present

## 2023-04-28 DIAGNOSIS — R0602 Shortness of breath: Secondary | ICD-10-CM | POA: Diagnosis not present

## 2023-04-28 DIAGNOSIS — S22000A Wedge compression fracture of unspecified thoracic vertebra, initial encounter for closed fracture: Secondary | ICD-10-CM | POA: Diagnosis not present

## 2023-04-28 DIAGNOSIS — J9621 Acute and chronic respiratory failure with hypoxia: Secondary | ICD-10-CM | POA: Diagnosis not present

## 2023-04-29 ENCOUNTER — Telehealth: Payer: Self-pay

## 2023-04-29 ENCOUNTER — Encounter: Payer: Self-pay | Admitting: Adult Health

## 2023-04-29 DIAGNOSIS — C7951 Secondary malignant neoplasm of bone: Secondary | ICD-10-CM | POA: Diagnosis not present

## 2023-04-29 NOTE — Telephone Encounter (Signed)
Called Central scheduling per pt. message. Central scheduling will reach out to pt. To get Ct scheduled.

## 2023-04-30 DIAGNOSIS — C7951 Secondary malignant neoplasm of bone: Secondary | ICD-10-CM | POA: Diagnosis not present

## 2023-05-01 DIAGNOSIS — C7951 Secondary malignant neoplasm of bone: Secondary | ICD-10-CM | POA: Diagnosis not present

## 2023-05-02 ENCOUNTER — Ambulatory Visit (HOSPITAL_COMMUNITY)
Admission: RE | Admit: 2023-05-02 | Discharge: 2023-05-02 | Disposition: A | Discharge status: Home / Self Care | Payer: Medicaid Other | Source: Ambulatory Visit | Attending: Adult Health | Admitting: Adult Health

## 2023-05-02 DIAGNOSIS — C50911 Malignant neoplasm of unspecified site of right female breast: Secondary | ICD-10-CM | POA: Diagnosis present

## 2023-05-02 DIAGNOSIS — C7801 Secondary malignant neoplasm of right lung: Secondary | ICD-10-CM | POA: Diagnosis present

## 2023-05-02 DIAGNOSIS — Z170 Estrogen receptor positive status [ER+]: Secondary | ICD-10-CM | POA: Insufficient documentation

## 2023-05-02 DIAGNOSIS — C50811 Malignant neoplasm of overlapping sites of right female breast: Secondary | ICD-10-CM | POA: Insufficient documentation

## 2023-05-02 DIAGNOSIS — C7951 Secondary malignant neoplasm of bone: Secondary | ICD-10-CM | POA: Insufficient documentation

## 2023-05-02 DIAGNOSIS — C7802 Secondary malignant neoplasm of left lung: Secondary | ICD-10-CM | POA: Diagnosis present

## 2023-05-02 MED ORDER — IOHEXOL 300 MG/ML  SOLN
100.0000 mL | Freq: Once | INTRAMUSCULAR | Status: AC | PRN
  Administered 2023-05-02: 100 mL via INTRAVENOUS

## 2023-05-03 DIAGNOSIS — C7951 Secondary malignant neoplasm of bone: Secondary | ICD-10-CM | POA: Diagnosis not present

## 2023-05-06 ENCOUNTER — Telehealth: Payer: Self-pay | Admitting: Hematology and Oncology

## 2023-05-06 ENCOUNTER — Encounter: Payer: Self-pay | Admitting: Hematology and Oncology

## 2023-05-06 DIAGNOSIS — C7951 Secondary malignant neoplasm of bone: Secondary | ICD-10-CM | POA: Diagnosis not present

## 2023-05-06 NOTE — Telephone Encounter (Signed)
Spoke with patient confirming upcoming appointment  

## 2023-05-07 DIAGNOSIS — C7951 Secondary malignant neoplasm of bone: Secondary | ICD-10-CM | POA: Diagnosis not present

## 2023-05-08 DIAGNOSIS — C7951 Secondary malignant neoplasm of bone: Secondary | ICD-10-CM | POA: Diagnosis not present

## 2023-05-09 ENCOUNTER — Ambulatory Visit: Payer: Medicaid Other

## 2023-05-09 ENCOUNTER — Ambulatory Visit: Payer: Medicaid Other | Admitting: Hematology and Oncology

## 2023-05-09 ENCOUNTER — Other Ambulatory Visit: Payer: Medicaid Other

## 2023-05-09 DIAGNOSIS — C7951 Secondary malignant neoplasm of bone: Secondary | ICD-10-CM | POA: Diagnosis not present

## 2023-05-10 DIAGNOSIS — C7951 Secondary malignant neoplasm of bone: Secondary | ICD-10-CM | POA: Diagnosis not present

## 2023-05-13 DIAGNOSIS — C7951 Secondary malignant neoplasm of bone: Secondary | ICD-10-CM | POA: Diagnosis not present

## 2023-05-14 ENCOUNTER — Other Ambulatory Visit: Payer: Self-pay

## 2023-05-14 DIAGNOSIS — C7951 Secondary malignant neoplasm of bone: Secondary | ICD-10-CM | POA: Diagnosis not present

## 2023-05-15 DIAGNOSIS — C7951 Secondary malignant neoplasm of bone: Secondary | ICD-10-CM | POA: Diagnosis not present

## 2023-05-16 ENCOUNTER — Other Ambulatory Visit (HOSPITAL_COMMUNITY): Payer: Self-pay

## 2023-05-16 ENCOUNTER — Other Ambulatory Visit: Payer: Self-pay

## 2023-05-16 DIAGNOSIS — C7951 Secondary malignant neoplasm of bone: Secondary | ICD-10-CM | POA: Diagnosis not present

## 2023-05-17 DIAGNOSIS — C7951 Secondary malignant neoplasm of bone: Secondary | ICD-10-CM | POA: Diagnosis not present

## 2023-05-20 DIAGNOSIS — C7951 Secondary malignant neoplasm of bone: Secondary | ICD-10-CM | POA: Diagnosis not present

## 2023-05-21 DIAGNOSIS — C7951 Secondary malignant neoplasm of bone: Secondary | ICD-10-CM | POA: Diagnosis not present

## 2023-05-23 DIAGNOSIS — C7951 Secondary malignant neoplasm of bone: Secondary | ICD-10-CM | POA: Diagnosis not present

## 2023-05-24 ENCOUNTER — Telehealth: Payer: Self-pay | Admitting: Hematology and Oncology

## 2023-05-24 DIAGNOSIS — C7951 Secondary malignant neoplasm of bone: Secondary | ICD-10-CM | POA: Diagnosis not present

## 2023-05-24 NOTE — Telephone Encounter (Signed)
Patient is aware of scheduled appointment times/dates

## 2023-05-27 ENCOUNTER — Inpatient Hospital Stay (HOSPITAL_BASED_OUTPATIENT_CLINIC_OR_DEPARTMENT_OTHER): Payer: Medicaid Other | Admitting: Hematology and Oncology

## 2023-05-27 ENCOUNTER — Inpatient Hospital Stay: Payer: Medicaid Other | Attending: Adult Health

## 2023-05-27 ENCOUNTER — Inpatient Hospital Stay: Payer: Medicaid Other

## 2023-05-27 ENCOUNTER — Other Ambulatory Visit: Payer: Self-pay

## 2023-05-27 ENCOUNTER — Other Ambulatory Visit (HOSPITAL_COMMUNITY): Payer: Self-pay

## 2023-05-27 ENCOUNTER — Inpatient Hospital Stay: Payer: Medicaid Other | Admitting: Nurse Practitioner

## 2023-05-27 ENCOUNTER — Encounter: Payer: Self-pay | Admitting: Hematology and Oncology

## 2023-05-27 VITALS — BP 147/63

## 2023-05-27 VITALS — BP 152/56 | HR 84 | Temp 98.3°F | Resp 16 | Wt 180.6 lb

## 2023-05-27 DIAGNOSIS — C7951 Secondary malignant neoplasm of bone: Secondary | ICD-10-CM

## 2023-05-27 DIAGNOSIS — Z79899 Other long term (current) drug therapy: Secondary | ICD-10-CM | POA: Insufficient documentation

## 2023-05-27 DIAGNOSIS — Z170 Estrogen receptor positive status [ER+]: Secondary | ICD-10-CM

## 2023-05-27 DIAGNOSIS — C50811 Malignant neoplasm of overlapping sites of right female breast: Secondary | ICD-10-CM | POA: Insufficient documentation

## 2023-05-27 DIAGNOSIS — Z5111 Encounter for antineoplastic chemotherapy: Secondary | ICD-10-CM | POA: Insufficient documentation

## 2023-05-27 LAB — CMP (CANCER CENTER ONLY)
ALT: 12 U/L (ref 0–44)
AST: 25 U/L (ref 15–41)
Albumin: 3.6 g/dL (ref 3.5–5.0)
Alkaline Phosphatase: 102 U/L (ref 38–126)
Anion gap: 6 (ref 5–15)
BUN: 14 mg/dL (ref 8–23)
CO2: 29 mmol/L (ref 22–32)
Calcium: 8.4 mg/dL — ABNORMAL LOW (ref 8.9–10.3)
Chloride: 107 mmol/L (ref 98–111)
Creatinine: 0.91 mg/dL (ref 0.44–1.00)
GFR, Estimated: >60 mL/min (ref >60)
Glucose, Bld: 108 mg/dL — ABNORMAL HIGH (ref 70–99)
Potassium: 3.9 mmol/L (ref 3.5–5.1)
Sodium: 142 mmol/L (ref 135–145)
Total Bilirubin: 0.5 mg/dL (ref 0.0–1.2)
Total Protein: 6.4 g/dL — ABNORMAL LOW (ref 6.5–8.1)

## 2023-05-27 LAB — CBC WITH DIFFERENTIAL (CANCER CENTER ONLY)
Abs Immature Granulocytes: 0.02 10*3/uL (ref 0.00–0.07)
Basophils Absolute: 0 10*3/uL (ref 0.0–0.1)
Basophils Relative: 1 %
Eosinophils Absolute: 0.4 10*3/uL (ref 0.0–0.5)
Eosinophils Relative: 5 %
HCT: 28.3 % — ABNORMAL LOW (ref 36.0–46.0)
Hemoglobin: 8.3 g/dL — ABNORMAL LOW (ref 12.0–15.0)
Immature Granulocytes: 0 %
Lymphocytes Relative: 15 %
Lymphs Abs: 1 10*3/uL (ref 0.7–4.0)
MCH: 23.6 pg — ABNORMAL LOW (ref 26.0–34.0)
MCHC: 29.3 g/dL — ABNORMAL LOW (ref 30.0–36.0)
MCV: 80.4 fL (ref 80.0–100.0)
Monocytes Absolute: 0.5 10*3/uL (ref 0.1–1.0)
Monocytes Relative: 8 %
Neutro Abs: 4.7 10*3/uL (ref 1.7–7.7)
Neutrophils Relative %: 71 %
Platelet Count: 253 10*3/uL (ref 150–400)
RBC: 3.52 MIL/uL — ABNORMAL LOW (ref 3.87–5.11)
RDW: 18.3 % — ABNORMAL HIGH (ref 11.5–15.5)
WBC Count: 6.6 10*3/uL (ref 4.0–10.5)
nRBC: 0 % (ref 0.0–0.2)

## 2023-05-27 LAB — VITAMIN B12: Vitamin B-12: 1267 pg/mL — ABNORMAL HIGH (ref 180–914)

## 2023-05-27 MED ORDER — B-12 1000 MCG PO TABS
1000.0000 ug | ORAL_TABLET | Freq: Every day | ORAL | 1 refills | Status: DC
  Filled 2023-05-27: qty 90, 90d supply, fill #0
  Filled 2023-09-24: qty 90, 90d supply, fill #1

## 2023-05-27 MED ORDER — FULVESTRANT 250 MG/5ML IM SOSY
500.0000 mg | PREFILLED_SYRINGE | Freq: Once | INTRAMUSCULAR | Status: AC
  Administered 2023-05-27: 500 mg via INTRAMUSCULAR
  Filled 2023-05-27: qty 10

## 2023-05-27 NOTE — Assessment & Plan Note (Addendum)
Suzanne [name] is a 62 year old woman with metastatic breast cancer currently on treatment with Truqap and faslodex.  Breast Cancer Patient is tolerating Truqap and Faslodex well.  She had excellent clinical response and radiological response so far. -Continue Truqap 2 tabs in the morning and 1 at night for 4 days on, hold for 3 days. -Continue Faslodex as prescribed. -Recent scans with stable disease.  Anemia Hemoglobin 8.3, likely contributing to fatigue. Patient has been taking B12. -Continue B12 supplementation at 2000 mcg daily (1000 mcg twice daily). -Check hemoglobin levels at next lab visit.  Dental Health Cracked wisdom tooth, planned extraction. Currently on Xgeva which needs to be held due to risk of impaired healing. -Hold Xgeva until after tooth extraction and clearance from dentist.  General Health Maintenance -Continue Faslodex every four weeks. -Perform labs every four weeks. -Obtain prescription for hospital bed mattress. -Schedule follow-up appointments at checkout.

## 2023-05-27 NOTE — Progress Notes (Signed)
[facility] Cancer Follow up:    [name], NP [address] [place] [address]   DIAGNOSIS:  Cancer Staging  Carcinoma of breast, estrogen receptor positive, stage 4, right (HCC) Staging form: Breast, AJCC 8th Edition - Clinical: Stage IV (pM1) - Signed by Loa Socks, NP on 02/15/2023   SUMMARY OF ONCOLOGIC HISTORY: [place], [place] woman presenting 10/26/2018 with right-sided inflammatory breast cancer, stage IV, involving lungs, lymph nodes and bones, as follows:             (a) chest CT scan 10/26/2018 shows bilateral pleural effusions, possible lymphangitic spread of tumor, diffuse bony metastatic disease, and significant axillary mediastinal and hilar adenopathy             (b) bone scan 10/27/2018 is a "near SuperScan" consistent with widespread bony metastatic disease             (c) head CT with and without contrast 10/30/2018 shows no intracranial metastatic disease, multiple calvarial lesions             (d) CA-27-29 on 10/27/2018 was 1810.4   (1) pleural fluid from right thoracentesis 10/28/2018 confirms malignant cells consistent with a breast primary, strongly estrogen and progesterone receptor positive, HER-2 not amplified, with an MIB-1 of 2%   (2) anastrozole started 10/29/2018             (a) palbociclib started 11/13/2018 at 100 mg dose             (b) denosumab/xgeva started 11/13/2018              (C) palbociclib dose decreased to 75mg              (D) Palbociclib dose decreased to 75 mg every other day in 11/2021   (3) associated problems:             (a) hypoxia secondary to effusions             (b) pain from bone lesions and left humeral and vertebral compression fractures             (c) right upper extremity lymphedema             (d) poor venous access   (4) genetics testing on 12/25/2018 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 STK11, TP53, TSC1, TSC2, and VHL.  The following genes were evaluated for sequence changes only: SDHA and HOXB13 c.251G>A variant only. The [facility] Melanoma Panel analyzed the following 9 genes: BAP1 BRCA2 CDK4 CDKN2A MITF POT1 PTEN RB1 Tp53.    (5) palliative radiation to the left humerus 07/20/2019 - 07/31/2019 Site Technique Total Dose (Gy) Dose per Fx (Gy) Completed Fx Beam Energies  Humerus, Left: Ext_Lt_humerus Complex 30/30 3 10/10 6X    (6) anemia requiring transfusion:              (a) B-12 level 134 FEB 2022: B-12 injectons started              (  B) iron deficient, IV iron to start 2/6             (C) Hospitalized with upper GI bleed 05/27/2022-05/30/2022   (7) Foundation one testing showed MSS, TMB 7muts/Mb, ESR1 mutation, AKT 1 mutation   (7) Elacestrant started 04/30/2022, progression noted on imaging from May 2024  (8) Capivasertib, Fulvestrant 10/25/2022  CURRENT THERAPY: Fulvestrant; Capivasertib  INTERVAL HISTORY:  Suzanne [name] 62 y.o. female returns for f/u of her metastatic breast cancer.  She is taking Capivasertib BID, (160mg ) 2 tab in am and 1 tab in PM along with Fulvestrant injections every 4 weeks.    Discussed the use of AI scribe software for clinical note transcription with the patient, who gave verbal consent to proceed.  History of Present Illness    The patient, with a history of cancer with bone metastasis, presents for a follow-up visit. She reports feeling very tired for about a week and a half, with  no apparent cause such as a cold. The fatigue is significant enough that she falls asleep easily when not actively engaged in an activity. The patient is currently on Truqap, which she takes two in the morning and one at night from Monday to Thursday, with a break on Friday. She reports no new side effects from this medication. The patient also has psoriasis, which is well-controlled. She has a cracked wisdom tooth that has worsened recently and needs to be removed, hold xgeva.  Patient Active Problem List   Diagnosis Date Noted   UGI bleed 05/27/2022   Acute prerenal azotemia 04/18/2022   Dehydration 04/18/2022   SIRS (systemic inflammatory response syndrome) (HCC) 04/18/2022   Allergic rhinitis 04/18/2022   Depression 04/18/2022   Anemia of chronic disease 04/18/2022   Bronchitis 04/18/2022   Acute bronchitis 04/17/2022   Intertrigo 01/10/2022   Bacterial vaginitis 01/10/2022   Sepsis due to cellulitis (HCC) 12/06/2021   B12 deficiency anemia 08/15/2020   Sensorineural hearing loss (SNHL) of both ears 08/07/2019   Intractable back pain 02/03/2019   Thoracic compression fracture, closed, initial encounter (HCC) 02/03/2019   Essential hypertension 02/03/2019   Genetic testing 01/27/2019   Acute pharyngitis    Bacteremia    FTT (failure to thrive) in adult    Fracture closed, humerus, shaft 01/09/2019   Pneumonia 12/25/2018   Acute respiratory disorder in immunocompromised patient (HCC) 12/25/2018   Family history of breast cancer    Family history of prostate cancer    Family history of melanoma    Family history of colon cancer    Pressure injury of skin 11/24/2018   Chronic respiratory failure with hypoxia and hypercapnia (HCC) 11/19/2018   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   Hypophosphatemia 11/19/2018   Hypocalcemia 11/19/2018   Aspiration pneumonia (HCC) 11/19/2018   SOB (shortness of breath)    Palliative care by specialist    Carcinoma of breast, estrogen receptor  positive, stage 4, right (HCC) 11/16/2018   Hypoalbuminemia 11/16/2018   Pleural effusion 11/14/2018   Goals of care, counseling/discussion 11/06/2018   Malignant neoplasm of overlapping sites of right breast in female, estrogen receptor positive (HCC) 10/31/2018   Malignant neoplasm metastatic to lung (HCC) 10/31/2018   Pain from bone metastases (HCC) 10/31/2018   Class 2 obesity 10/31/2018   Bone injury    Pleural effusion, bilateral    Shortness of breath    Abnormal breast finding    Hypokalemia    Breast skin changes    Macrocytic anemia  Malignant neoplasm metastatic to bone (HCC)    Anemia, B12 deficiency 10/26/2018    is allergic to ferumoxytol.  MEDICAL HISTORY: Past Medical History:  Diagnosis Date   Anemia    Cancer (HCC)    Family history of breast cancer    Family history of colon cancer    Family history of melanoma    Family history of prostate cancer    Gallstones    History of hiatal hernia    History of kidney stones    Mastitis    reports history of recurrent mastitis   Metastatic breast cancer    Obesity    Psoriasis     SURGICAL HISTORY: Past Surgical History:  Procedure Laterality Date   CYSTOSCOPY/URETEROSCOPY/HOLMIUM LASER/STENT PLACEMENT Left 10/17/2022   Procedure: CYSTOSCOPY, LEFT RETROGRADE PYELOGRAM, LEFT URETEROSCOPY, HOLMIUM LASER LITHOTRIPSY, BASKET EXTRACTION, AND LEFT URETERAL STENT PLACEMENT;  Surgeon: Rene Paci, MD;  Location: [facility];  Service: Urology;  Laterality: Left;  45 MINUTES   ESOPHAGOGASTRODUODENOSCOPY (EGD) WITH PROPOFOL N/A 05/27/2022   Procedure: ESOPHAGOGASTRODUODENOSCOPY (EGD) WITH PROPOFOL;  Surgeon: Willis Modena, MD;  Location: [facility];  Service: Gastroenterology;  Laterality: N/A;   EYE SURGERY     cataract   GLAUCOMA SURGERY  late 1990's   IR THORACENTESIS ASP PLEURAL SPACE W/IMG GUIDE  10/28/2018   IR THORACENTESIS ASP PLEURAL SPACE W/IMG GUIDE  10/29/2018   OPEN REDUCTION INTERNAL  FIXATION (ORIF) DISTAL RADIAL FRACTURE Left 01/09/2019   Procedure: OPEN REDUCTION INTERNAL FIXATION (ORIF) HUMERAL FRACTURE;  Surgeon: Beverely Low, MD;  Location: [facility];  Service: Orthopedics;  Laterality: Left;    SOCIAL HISTORY: Social History   Socioeconomic History   Marital status: Widowed    Spouse name: Not on file   Number of children: 1   Years of education: Not on file   Highest education level: Not on file  Occupational History   Occupation: Visual merchandiser Programs    Comment: Self-employed  Tobacco Use   Smoking status: Never   Smokeless tobacco: Never   Tobacco comments:    second hand smoke exposure  Vaping Use   Vaping status: Never Used  Substance and Sexual Activity   Alcohol use: No   Drug use: No   Sexual activity: Not Currently  Other Topics Concern   Not on file  Social History Narrative   Has a daughter named [name]. Daughter has CP.   Social Drivers of Corporate investment banker Strain: Low Risk  (09/17/2022)   Overall Financial Resource Strain (CARDIA)    Difficulty of Paying Living Expenses: Not hard at all  Food Insecurity: No Food Insecurity (09/17/2022)   Hunger Vital Sign    Worried About Running Out of Food in the Last Year: Never true    Ran Out of Food in the Last Year: Never true  Transportation Needs: No Transportation Needs (09/17/2022)   PRAPARE - Administrator, Civil Service (Medical): No    Lack of Transportation (Non-Medical): No  Physical Activity: Inactive (03/07/2023)   Exercise Vital Sign    Days of Exercise per Week: 0 days    Minutes of Exercise per Session: 0 min  Stress: Stress Concern Present (03/07/2023)   [employer] of Occupational Health - Occupational Stress Questionnaire    Feeling of Stress : To some extent  Social Connections: Socially Isolated (03/07/2023)   Social Connection and Isolation Panel [NHANES]    Frequency of Communication with Friends and Family: Once a week  Frequency of Social Gatherings with Friends and Family: Once a week    Attends Religious Services: Never    Database administrator or Organizations: Yes    Attends Banker Meetings: Never    Marital Status: Widowed  Intimate Partner Violence: Not At Risk (09/17/2022)   Humiliation, Afraid, Rape, and Kick questionnaire    Fear of Current or Ex-Partner: No    Emotionally Abused: No    Physically Abused: No    Sexually Abused: No    FAMILY HISTORY: Family History  Problem Relation Age of Onset   Breast cancer Sister        in her 7's-70s   Heart disease Brother        CABG   Heart disease Brother        CABG   Skin cancer Brother        melanoma   Colon cancer Cousin    Heart attack Mother    Heart attack Father    Prostate cancer Brother     Review of Systems  Constitutional:  Negative for appetite change, chills, fatigue, fever and unexpected weight change.  HENT:   Negative for hearing loss, lump/mass and trouble swallowing.   Eyes:  Negative for eye problems and icterus.  Respiratory:  Negative for chest tightness, cough and shortness of breath.   Cardiovascular:  Negative for chest pain, leg swelling and palpitations.  Gastrointestinal:  Negative for abdominal distention, abdominal pain, constipation, diarrhea, nausea and vomiting.  Endocrine: Negative for hot flashes.  Genitourinary:  Negative for difficulty urinating.   Musculoskeletal:  Negative for arthralgias.  Skin:  Negative for itching and rash.  Neurological:  Negative for dizziness, extremity weakness, headaches and numbness.  Hematological:  Negative for adenopathy. Does not bruise/bleed easily.  Psychiatric/Behavioral:  Negative for depression. The patient is not nervous/anxious.       PHYSICAL EXAMINATION  BP (!) 152/56 (BP Location: Right Arm, Patient Position: Sitting) Comment: 1st BP 152/56 ; 2nd BP 158/67;  Pulse 84   Temp 98.3 F (36.8 C) (Temporal)   Resp 16   Wt 180 lb 9.6 oz  (81.9 kg)   LMP 05/28/2013 (Within Months)   SpO2 100%   BMI 36.48 kg/m    Physical Exam Constitutional:      General: She is not in acute distress.    Appearance: Normal appearance. She is not toxic-appearing.     Comments: Examined in wheelchair   HENT:     Head: Normocephalic and atraumatic.     Mouth/Throat:     Mouth: Mucous membranes are moist.     Pharynx: Oropharynx is clear. No oropharyngeal exudate or posterior oropharyngeal erythema.  Eyes:     General: No scleral icterus. Cardiovascular:     Rate and Rhythm: Normal rate and regular rhythm.     Pulses: Normal pulses.     Heart sounds: Normal heart sounds.  Pulmonary:     Effort: Pulmonary effort is normal.     Breath sounds: Normal breath sounds.  Abdominal:     General: Abdomen is flat. Bowel sounds are normal. There is no distension.     Palpations: Abdomen is soft.     Tenderness: There is no abdominal tenderness.  Musculoskeletal:        General: No swelling.     Cervical back: Neck supple.  Lymphadenopathy:     Cervical: No cervical adenopathy.  Skin:    General: Skin is warm and dry.  Findings: Rash: psoriasis flare, following up with Dr. Onalee Hua.  Neurological:     General: No focal deficit present.     Mental Status: She is alert.  Psychiatric:        Mood and Affect: Mood normal.        Behavior: Behavior normal.     LABORATORY DATA:  CBC    Component Value Date/Time   WBC 6.6 05/27/2023 1203   WBC 5.1 10/16/2022 1301   RBC 3.52 (L) 05/27/2023 1203   HGB 8.3 (L) 05/27/2023 1203   HCT 28.3 (L) 05/27/2023 1203   PLT 253 05/27/2023 1203   MCV 80.4 05/27/2023 1203   MCH 23.6 (L) 05/27/2023 1203   MCHC 29.3 (L) 05/27/2023 1203   RDW 18.3 (H) 05/27/2023 1203   LYMPHSABS 1.0 05/27/2023 1203   MONOABS 0.5 05/27/2023 1203   EOSABS 0.4 05/27/2023 1203   BASOSABS 0.0 05/27/2023 1203    CMP     Component Value Date/Time   NA 142 05/27/2023 1203   K 3.9 05/27/2023 1203   CL 107  05/27/2023 1203   CO2 29 05/27/2023 1203   GLUCOSE 108 (H) 05/27/2023 1203   BUN 14 05/27/2023 1203   CREATININE 0.91 05/27/2023 1203   CALCIUM 8.4 (L) 05/27/2023 1203   PROT 6.4 (L) 05/27/2023 1203   ALBUMIN 3.6 05/27/2023 1203   AST 25 05/27/2023 1203   ALT 12 05/27/2023 1203   ALKPHOS 102 05/27/2023 1203   BILITOT 0.5 05/27/2023 1203   GFRNONAA >60 05/27/2023 1203   GFRAA >60 02/24/2020 1454   GFRAA >60 07/24/2019 1453    ASSESSMENT and THERAPY PLAN:   Malignant neoplasm metastatic to bone [facility]) Annastyn Ingles is a 62 year old woman with metastatic breast cancer currently on treatment with Truqap and faslodex.  Breast Cancer Patient is tolerating Truqap and Faslodex well.  She had excellent clinical response and radiological response so far. -Continue Truqap 2 tabs in the morning and 1 at night for 4 days on, hold for 3 days. -Continue Faslodex as prescribed. -Recent scans with stable disease.  Anemia Hemoglobin 8.3, likely contributing to fatigue. Patient has been taking B12. -Continue B12 supplementation at 2000 mcg daily (1000 mcg twice daily). -Check hemoglobin levels at next lab visit.  Dental Health Cracked wisdom tooth, planned extraction. Currently on Xgeva which needs to be held due to risk of impaired healing. -Hold Xgeva until after tooth extraction and clearance from dentist.  General Health Maintenance -Continue Faslodex every four weeks. -Perform labs every four weeks. -Obtain prescription for hospital bed mattress. -Schedule follow-up appointments at checkout.   All questions were answered. The patient knows to call the clinic with any problems, questions or concerns. We can certainly see the patient much sooner if necessary.  Total encounter time:30 minutes*in face-to-face visit time, chart review, lab review, care coordination, order entry, and documentation of the encounter time.   *Total Encounter Time as defined by the Centers for Medicare and  Medicaid Services includes, in addition to the face-to-face time of a patient visit (documented in the note above) non-face-to-face time: obtaining and reviewing outside history, ordering and reviewing medications, tests or procedures, care coordination (communications with other health care professionals or caregivers) and documentation in the medical record.

## 2023-05-28 DIAGNOSIS — C7951 Secondary malignant neoplasm of bone: Secondary | ICD-10-CM | POA: Diagnosis not present

## 2023-05-28 LAB — CANCER ANTIGEN 27.29: CA 27.29: 425.4 U/mL — ABNORMAL HIGH (ref 0.0–38.6)

## 2023-05-29 DIAGNOSIS — R0602 Shortness of breath: Secondary | ICD-10-CM | POA: Diagnosis not present

## 2023-05-29 DIAGNOSIS — S22000A Wedge compression fracture of unspecified thoracic vertebra, initial encounter for closed fracture: Secondary | ICD-10-CM | POA: Diagnosis not present

## 2023-05-29 DIAGNOSIS — J9621 Acute and chronic respiratory failure with hypoxia: Secondary | ICD-10-CM | POA: Diagnosis not present

## 2023-05-29 DIAGNOSIS — C7801 Secondary malignant neoplasm of right lung: Secondary | ICD-10-CM | POA: Diagnosis not present

## 2023-05-29 DIAGNOSIS — C7951 Secondary malignant neoplasm of bone: Secondary | ICD-10-CM | POA: Diagnosis not present

## 2023-05-30 ENCOUNTER — Other Ambulatory Visit (HOSPITAL_COMMUNITY): Payer: Self-pay

## 2023-05-30 ENCOUNTER — Other Ambulatory Visit: Payer: Self-pay

## 2023-05-30 DIAGNOSIS — C7951 Secondary malignant neoplasm of bone: Secondary | ICD-10-CM | POA: Diagnosis not present

## 2023-05-30 NOTE — Progress Notes (Signed)
 Specialty Pharmacy Refill Coordination Note  Suzanne [name] is a 63 y.o. female contacted today regarding refills of specialty medication(s) Capivasertib  (TRUQAP )   Patient requested Delivery   Delivery date: 06/06/23   Verified address: [address]  [place] [address]   Medication will be filled on 06/05/23.

## 2023-05-30 NOTE — Progress Notes (Signed)
 Specialty Pharmacy Ongoing Clinical Assessment Note  Suzanne [name] is a 63 y.o. female who is being followed by the specialty pharmacy service for RxSp Oncology   Patient's specialty medication(s) reviewed today: Capivasertib  (TRUQAP )   Missed doses in the last 4 weeks: 0   Patient/Caregiver did not have any additional questions or concerns.   Therapeutic benefit summary: Patient is achieving benefit   Adverse events/side effects summary: No adverse events/side effects   Patient's therapy is appropriate to: Continue    Goals Addressed             This Visit's Progress    Slow Disease Progression       Patient is on track. Patient will maintain adherence.  Dr. Loretha documented an excellent response and stable disease at this time (05/27/23).         Follow up:  6 months  Silvano [name] Specialty Pharmacist

## 2023-05-31 ENCOUNTER — Other Ambulatory Visit: Payer: Self-pay

## 2023-05-31 DIAGNOSIS — C7951 Secondary malignant neoplasm of bone: Secondary | ICD-10-CM | POA: Diagnosis not present

## 2023-06-03 ENCOUNTER — Other Ambulatory Visit (HOSPITAL_COMMUNITY): Payer: Self-pay

## 2023-06-03 ENCOUNTER — Other Ambulatory Visit: Payer: Self-pay | Admitting: Pharmacist

## 2023-06-03 ENCOUNTER — Other Ambulatory Visit: Payer: Self-pay

## 2023-06-03 DIAGNOSIS — C7951 Secondary malignant neoplasm of bone: Secondary | ICD-10-CM | POA: Diagnosis not present

## 2023-06-03 MED ORDER — CAPIVASERTIB 200 MG PO TBPK
200.0000 mg | ORAL_TABLET | Freq: Two times a day (BID) | ORAL | 0 refills | Status: DC
  Filled 2023-06-03: qty 64, 34d supply, fill #0

## 2023-06-03 NOTE — Progress Notes (Signed)
 Dose changed to 200mg  twice daily for 4 days on/3 days off. Contacted patient to counsel. Will still ship 1/8 for delivery 1/9.

## 2023-06-04 ENCOUNTER — Other Ambulatory Visit: Payer: Self-pay

## 2023-06-04 DIAGNOSIS — C7951 Secondary malignant neoplasm of bone: Secondary | ICD-10-CM | POA: Diagnosis not present

## 2023-06-05 ENCOUNTER — Other Ambulatory Visit: Payer: Self-pay

## 2023-06-05 ENCOUNTER — Telehealth: Payer: Self-pay | Admitting: *Deleted

## 2023-06-05 DIAGNOSIS — C7801 Secondary malignant neoplasm of right lung: Secondary | ICD-10-CM | POA: Diagnosis not present

## 2023-06-05 DIAGNOSIS — R0602 Shortness of breath: Secondary | ICD-10-CM | POA: Diagnosis not present

## 2023-06-05 DIAGNOSIS — C7951 Secondary malignant neoplasm of bone: Secondary | ICD-10-CM | POA: Diagnosis not present

## 2023-06-05 DIAGNOSIS — S22000A Wedge compression fracture of unspecified thoracic vertebra, initial encounter for closed fracture: Secondary | ICD-10-CM | POA: Diagnosis not present

## 2023-06-05 DIAGNOSIS — J9621 Acute and chronic respiratory failure with hypoxia: Secondary | ICD-10-CM | POA: Diagnosis not present

## 2023-06-05 NOTE — Telephone Encounter (Signed)
 Message left on VM from pt's dtr- [name]- wanting to report noted blood sugar of 136 this am.  Just the highest it has been and since she started the medication capivasertib .   Of note she has not started the increased dose at this time- waiting for medication.  She is not on any hyperglycemic medications- but does have metformin  in the home. Secondly pt is having  stomach discomfort more consistently  she has had this discomfort before but now returned.  Onset was over the weekend so pt took her protonix  40 mg 2 times a day vs 1x with noted benefit and did so the following day but then went back to 1 a day due to need to discuss with MD.  Tums or her antinausea medications does not give benefit,  Pt has had no issues with bowel or urination.  This note will be forwarded to MD for review and recommendations.

## 2023-06-06 ENCOUNTER — Encounter: Payer: Self-pay | Admitting: Pharmacist

## 2023-06-06 ENCOUNTER — Other Ambulatory Visit: Payer: Self-pay

## 2023-06-06 ENCOUNTER — Telehealth: Payer: Self-pay | Admitting: *Deleted

## 2023-06-06 DIAGNOSIS — C7951 Secondary malignant neoplasm of bone: Secondary | ICD-10-CM | POA: Diagnosis not present

## 2023-06-06 MED ORDER — CEPHALEXIN 500 MG PO CAPS
500.0000 mg | ORAL_CAPSULE | Freq: Two times a day (BID) | ORAL | 0 refills | Status: DC
  Filled 2023-06-06: qty 14, 7d supply, fill #0

## 2023-06-06 NOTE — Telephone Encounter (Signed)
 This RN reviewed issues with MD - as well as new onset of breast that is red, warm and tender to touch.  Prescription given for antibiotic.  Continue to monitor blood sugars early am before breakfast.  Note new mattress has been received.  Called and informed [name] with verbalized understanding- verified her pharmacy and prescription sent.

## 2023-06-07 ENCOUNTER — Encounter: Payer: Self-pay | Admitting: Hematology and Oncology

## 2023-06-07 ENCOUNTER — Other Ambulatory Visit (HOSPITAL_COMMUNITY): Payer: Self-pay

## 2023-06-10 DIAGNOSIS — C7951 Secondary malignant neoplasm of bone: Secondary | ICD-10-CM | POA: Diagnosis not present

## 2023-06-11 DIAGNOSIS — C7951 Secondary malignant neoplasm of bone: Secondary | ICD-10-CM | POA: Diagnosis not present

## 2023-06-12 DIAGNOSIS — C7951 Secondary malignant neoplasm of bone: Secondary | ICD-10-CM | POA: Diagnosis not present

## 2023-06-13 DIAGNOSIS — C7951 Secondary malignant neoplasm of bone: Secondary | ICD-10-CM | POA: Diagnosis not present

## 2023-06-14 DIAGNOSIS — C7951 Secondary malignant neoplasm of bone: Secondary | ICD-10-CM | POA: Diagnosis not present

## 2023-06-17 DIAGNOSIS — C7951 Secondary malignant neoplasm of bone: Secondary | ICD-10-CM | POA: Diagnosis not present

## 2023-06-18 DIAGNOSIS — C7951 Secondary malignant neoplasm of bone: Secondary | ICD-10-CM | POA: Diagnosis not present

## 2023-06-19 DIAGNOSIS — C7951 Secondary malignant neoplasm of bone: Secondary | ICD-10-CM | POA: Diagnosis not present

## 2023-06-20 ENCOUNTER — Other Ambulatory Visit: Payer: Self-pay

## 2023-06-20 DIAGNOSIS — C7951 Secondary malignant neoplasm of bone: Secondary | ICD-10-CM | POA: Diagnosis not present

## 2023-06-21 DIAGNOSIS — C7951 Secondary malignant neoplasm of bone: Secondary | ICD-10-CM | POA: Diagnosis not present

## 2023-06-24 DIAGNOSIS — C7951 Secondary malignant neoplasm of bone: Secondary | ICD-10-CM | POA: Diagnosis not present

## 2023-06-25 ENCOUNTER — Inpatient Hospital Stay: Payer: Medicaid Other

## 2023-06-25 ENCOUNTER — Inpatient Hospital Stay: Payer: Medicaid Other | Attending: Adult Health

## 2023-06-25 ENCOUNTER — Encounter: Payer: Self-pay | Admitting: Adult Health

## 2023-06-25 ENCOUNTER — Other Ambulatory Visit: Payer: Self-pay

## 2023-06-25 ENCOUNTER — Inpatient Hospital Stay (HOSPITAL_BASED_OUTPATIENT_CLINIC_OR_DEPARTMENT_OTHER): Payer: Medicaid Other | Admitting: Adult Health

## 2023-06-25 VITALS — BP 140/72 | HR 74 | Temp 98.2°F | Resp 16 | Wt 176.6 lb

## 2023-06-25 DIAGNOSIS — Z79899 Other long term (current) drug therapy: Secondary | ICD-10-CM | POA: Diagnosis not present

## 2023-06-25 DIAGNOSIS — C7951 Secondary malignant neoplasm of bone: Secondary | ICD-10-CM | POA: Diagnosis present

## 2023-06-25 DIAGNOSIS — Z5111 Encounter for antineoplastic chemotherapy: Secondary | ICD-10-CM | POA: Insufficient documentation

## 2023-06-25 DIAGNOSIS — C50911 Malignant neoplasm of unspecified site of right female breast: Secondary | ICD-10-CM

## 2023-06-25 DIAGNOSIS — Z170 Estrogen receptor positive status [ER+]: Secondary | ICD-10-CM | POA: Diagnosis not present

## 2023-06-25 DIAGNOSIS — C50811 Malignant neoplasm of overlapping sites of right female breast: Secondary | ICD-10-CM | POA: Insufficient documentation

## 2023-06-25 LAB — CBC WITH DIFFERENTIAL (CANCER CENTER ONLY)
Abs Immature Granulocytes: 0.03 10*3/uL (ref 0.00–0.07)
Basophils Absolute: 0.1 10*3/uL (ref 0.0–0.1)
Basophils Relative: 1 %
Eosinophils Absolute: 0.4 10*3/uL (ref 0.0–0.5)
Eosinophils Relative: 6 %
HCT: 29.2 % — ABNORMAL LOW (ref 36.0–46.0)
Hemoglobin: 8.7 g/dL — ABNORMAL LOW (ref 12.0–15.0)
Immature Granulocytes: 1 %
Lymphocytes Relative: 24 %
Lymphs Abs: 1.4 10*3/uL (ref 0.7–4.0)
MCH: 23.6 pg — ABNORMAL LOW (ref 26.0–34.0)
MCHC: 29.8 g/dL — ABNORMAL LOW (ref 30.0–36.0)
MCV: 79.1 fL — ABNORMAL LOW (ref 80.0–100.0)
Monocytes Absolute: 0.5 10*3/uL (ref 0.1–1.0)
Monocytes Relative: 8 %
Neutro Abs: 3.6 10*3/uL (ref 1.7–7.7)
Neutrophils Relative %: 60 %
Platelet Count: 264 10*3/uL (ref 150–400)
RBC: 3.69 MIL/uL — ABNORMAL LOW (ref 3.87–5.11)
RDW: 18.6 % — ABNORMAL HIGH (ref 11.5–15.5)
WBC Count: 5.9 10*3/uL (ref 4.0–10.5)
nRBC: 0 % (ref 0.0–0.2)

## 2023-06-25 LAB — CMP (CANCER CENTER ONLY)
ALT: 20 U/L (ref 0–44)
AST: 36 U/L (ref 15–41)
Albumin: 3.7 g/dL (ref 3.5–5.0)
Alkaline Phosphatase: 123 U/L (ref 38–126)
Anion gap: 7 (ref 5–15)
BUN: 14 mg/dL (ref 8–23)
CO2: 29 mmol/L (ref 22–32)
Calcium: 9.2 mg/dL (ref 8.9–10.3)
Chloride: 103 mmol/L (ref 98–111)
Creatinine: 1.07 mg/dL — ABNORMAL HIGH (ref 0.44–1.00)
GFR, Estimated: 58 mL/min — ABNORMAL LOW (ref >60)
Glucose, Bld: 114 mg/dL — ABNORMAL HIGH (ref 70–99)
Potassium: 3.9 mmol/L (ref 3.5–5.1)
Sodium: 139 mmol/L (ref 135–145)
Total Bilirubin: 0.5 mg/dL (ref 0.0–1.2)
Total Protein: 6.6 g/dL (ref 6.5–8.1)

## 2023-06-25 LAB — VITAMIN B12: Vitamin B-12: 2449 pg/mL — ABNORMAL HIGH (ref 180–914)

## 2023-06-25 MED ORDER — FULVESTRANT 250 MG/5ML IM SOSY
500.0000 mg | PREFILLED_SYRINGE | Freq: Once | INTRAMUSCULAR | Status: AC
  Administered 2023-06-25: 500 mg via INTRAMUSCULAR
  Filled 2023-06-25: qty 10

## 2023-06-25 MED ORDER — TRAMADOL HCL 50 MG PO TABS
50.0000 mg | ORAL_TABLET | Freq: Four times a day (QID) | ORAL | 0 refills | Status: AC | PRN
  Filled 2023-06-25: qty 30, 8d supply, fill #0

## 2023-06-25 NOTE — Assessment & Plan Note (Signed)
Suzanne [name] is a 63 year old woman with metastatic breast cancer here today for f/u on treatment with Fulvestrant and Truqap.    Metastatic Breast Cancer No clinical or radiographic signs of metastatic breast cancer progression. Tolerating Fulvestrant and TruCapt well.  -Continue Fulvestrant and TruCapt as prescribed.   Dental Health Needs extraction.  This is not yet been scheduled. -Continue to hold Xgeva due to risk of osteonecrosis of the jaw  Anemia Hemoglobin 8.7, stable. -Monitor hemoglobin levels closely.  Cancer related pain Slightly increased related to barometric pressure changes.  PMP aware reviewed, no red flags, -Tramadol refilled #[id number]  RTC in 4 weeks for labs, f/u and injection

## 2023-06-25 NOTE — Progress Notes (Signed)
[facility] Cancer Follow up:    [name], Suzanne [name] [address] [place] [address]   DIAGNOSIS:  Cancer Staging  Carcinoma of breast, estrogen receptor positive, stage 4, right (HCC) Staging form: Breast, AJCC 8th Edition - Clinical: Stage IV (pM1) - Signed by Loa Socks, Suzanne [name] on 02/15/2023   SUMMARY OF ONCOLOGIC HISTORY: [place], [place] woman presenting 10/26/2018 with right-sided inflammatory breast cancer, stage IV, involving lungs, lymph nodes and bones, as follows:             (a) chest CT scan 10/26/2018 shows bilateral pleural effusions, possible lymphangitic spread of tumor, diffuse bony metastatic disease, and significant axillary mediastinal and hilar adenopathy             (b) bone scan 10/27/2018 is a "near SuperScan" consistent with widespread bony metastatic disease             (c) head CT with and without contrast 10/30/2018 shows no intracranial metastatic disease, multiple calvarial lesions             (d) CA-27-29 on 10/27/2018 was 1810.4   (1) pleural fluid from right thoracentesis 10/28/2018 confirms malignant cells consistent with a breast primary, strongly estrogen and progesterone receptor positive, HER-2 not amplified, with an MIB-1 of 2%   (2) anastrozole started 10/29/2018             (a) palbociclib started 11/13/2018 at 100 mg dose             (b) denosumab/xgeva started 11/13/2018              (C) palbociclib dose decreased to 75mg              (D) Palbociclib dose decreased to 75 mg every other day in 11/2021   (3) associated problems:             (a) hypoxia secondary to effusions             (b) pain from bone lesions and left humeral and vertebral compression fractures             (c) right upper extremity lymphedema             (d) poor venous access   (4) genetics testing on 12/25/2018 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 STK11, TP53, TSC1, TSC2, and VHL.  The following genes were evaluated for sequence changes only: SDHA and HOXB13 c.251G>A variant only. The [facility] Melanoma Panel analyzed the following 9 genes: BAP1 BRCA2 CDK4 CDKN2A MITF POT1 PTEN RB1 Tp53.    (5) palliative radiation to the left humerus 07/20/2019 - 07/31/2019 Site Technique Total Dose (Gy) Dose per Fx (Gy) Completed Fx Beam Energies  Humerus, Left: Ext_Lt_humerus Complex 30/30 3 10/10 6X    (6) anemia requiring transfusion:              (a) B-12 level 134 FEB 2022: B-12 injectons started              (  B) iron deficient, IV iron to start 2/6             (C) Hospitalized with upper GI bleed 05/27/2022-05/30/2022   (7) Foundation one testing showed MSS, TMB 23muts/Mb, ESR1 mutation, AKT 1 mutation    (7) Elacestrant started 04/30/2022, progression noted on imaging from May 2024   (8) Capivasertib, Fulvestrant 10/25/2022    CURRENT THERAPY: Capivasertib/Truqap, fulvestrant,(Xgeva on hold)  INTERVAL HISTORY: Suzanne [name] 63 y.o. female returns for follow-up of her metastatic breast cancer.  She continues on Truqap twice daily 4 days on and 3 days off.  She is tolerating it well.  She had breast cellulitis and was prescribed antibiotics and this is since resolved.  She is requesting a refill on tramadol because she has had increased bone pain where her cancer is and this typically flares up during barometric pressure changes.  The last fill of her tramadol was in September 2024.  She  overall is feeling well and has no questions or concerns today.   Patient Active Problem List   Diagnosis Date Noted   UGI bleed 05/27/2022   Acute prerenal azotemia 04/18/2022   Dehydration 04/18/2022   SIRS (systemic inflammatory response syndrome) (HCC) 04/18/2022   Allergic rhinitis 04/18/2022   Depression 04/18/2022   Anemia of chronic disease 04/18/2022   Bronchitis 04/18/2022   Acute bronchitis 04/17/2022   Intertrigo 01/10/2022   Bacterial vaginitis 01/10/2022   Sepsis due to cellulitis (HCC) 12/06/2021   B12 deficiency anemia 08/15/2020   Sensorineural hearing loss (SNHL) of both ears 08/07/2019   Intractable back pain 02/03/2019   Thoracic compression fracture, closed, initial encounter (HCC) 02/03/2019   Essential hypertension 02/03/2019   Genetic testing 01/27/2019   Acute pharyngitis    Bacteremia    FTT (failure to thrive) in adult    Fracture closed, humerus, shaft 01/09/2019   Pneumonia 12/25/2018   Acute respiratory disorder in immunocompromised patient (HCC) 12/25/2018   Family history of breast cancer    Family history of prostate cancer    Family history of melanoma    Family history of colon cancer    Pressure injury of skin 11/24/2018   Chronic respiratory failure with hypoxia and hypercapnia (HCC) 11/19/2018   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   Hypophosphatemia 11/19/2018   Hypocalcemia 11/19/2018   Aspiration pneumonia (HCC) 11/19/2018   SOB (shortness of breath)    Palliative care by specialist    Carcinoma of breast, estrogen receptor positive, stage 4, right (HCC) 11/16/2018   Hypoalbuminemia 11/16/2018   Pleural effusion 11/14/2018   Goals of care, counseling/discussion 11/06/2018   Malignant neoplasm of overlapping sites of right breast in female, estrogen receptor positive (HCC) 10/31/2018   Malignant neoplasm metastatic to lung (HCC) 10/31/2018   Pain from bone metastases (HCC) 10/31/2018   Class 2 obesity 10/31/2018   Bone injury     Pleural effusion, bilateral    Shortness of breath    Abnormal breast finding    Hypokalemia    Breast skin changes    Macrocytic anemia    Malignant neoplasm metastatic to bone (HCC)    Anemia, B12 deficiency 10/26/2018    is allergic to ferumoxytol.  MEDICAL HISTORY: Past Medical History:  Diagnosis Date   Anemia    Cancer (HCC)    Family history of breast cancer    Family history of colon cancer    Family history of melanoma    Family history of prostate cancer    Gallstones  History of hiatal hernia    History of kidney stones    Mastitis    reports history of recurrent mastitis   Metastatic breast cancer    Obesity    Psoriasis     SURGICAL HISTORY: Past Surgical History:  Procedure Laterality Date   CYSTOSCOPY/URETEROSCOPY/HOLMIUM LASER/STENT PLACEMENT Left 10/17/2022   Procedure: CYSTOSCOPY, LEFT RETROGRADE PYELOGRAM, LEFT URETEROSCOPY, HOLMIUM LASER LITHOTRIPSY, BASKET EXTRACTION, AND LEFT URETERAL STENT PLACEMENT;  Surgeon: Rene Paci, MD;  Location: [facility];  Service: Urology;  Laterality: Left;  45 MINUTES   ESOPHAGOGASTRODUODENOSCOPY (EGD) WITH PROPOFOL N/A 05/27/2022   Procedure: ESOPHAGOGASTRODUODENOSCOPY (EGD) WITH PROPOFOL;  Surgeon: Willis Modena, MD;  Location: [facility];  Service: Gastroenterology;  Laterality: N/A;   EYE SURGERY     cataract   GLAUCOMA SURGERY  late 1990's   IR THORACENTESIS ASP PLEURAL SPACE W/IMG GUIDE  10/28/2018   IR THORACENTESIS ASP PLEURAL SPACE W/IMG GUIDE  10/29/2018   OPEN REDUCTION INTERNAL FIXATION (ORIF) DISTAL RADIAL FRACTURE Left 01/09/2019   Procedure: OPEN REDUCTION INTERNAL FIXATION (ORIF) HUMERAL FRACTURE;  Surgeon: Beverely Low, MD;  Location: [facility];  Service: Orthopedics;  Laterality: Left;    SOCIAL HISTORY: Social History   Socioeconomic History   Marital status: Widowed    Spouse name: Not on file   Number of children: 1   Years of education: Not on file   Highest education  level: Not on file  Occupational History   Occupation: Visual merchandiser Programs    Comment: Self-employed  Tobacco Use   Smoking status: Never   Smokeless tobacco: Never   Tobacco comments:    second hand smoke exposure  Vaping Use   Vaping status: Never Used  Substance and Sexual Activity   Alcohol use: No   Drug use: No   Sexual activity: Not Currently  Other Topics Concern   Not on file  Social History Narrative   Has a daughter named [name]. Daughter has CP.   Social Drivers of Corporate investment banker Strain: Low Risk  (09/17/2022)   Overall Financial Resource Strain (CARDIA)    Difficulty of Paying Living Expenses: Not hard at all  Food Insecurity: No Food Insecurity (09/17/2022)   Hunger Vital Sign    Worried About Running Out of Food in the Last Year: Never true    Ran Out of Food in the Last Year: Never true  Transportation Needs: No Transportation Needs (09/17/2022)   PRAPARE - Administrator, Civil Service (Medical): No    Lack of Transportation (Non-Medical): No  Physical Activity: Inactive (03/07/2023)   Exercise Vital Sign    Days of Exercise per Week: 0 days    Minutes of Exercise per Session: 0 min  Stress: Stress Concern Present (03/07/2023)   [employer] of Occupational Health - Occupational Stress Questionnaire    Feeling of Stress : To some extent  Social Connections: Socially Isolated (03/07/2023)   Social Connection and Isolation Panel [NHANES]    Frequency of Communication with Friends and Family: Once a week    Frequency of Social Gatherings with Friends and Family: Once a week    Attends Religious Services: Never    Database administrator or Organizations: Yes    Attends Banker Meetings: Never    Marital Status: Widowed  Intimate Partner Violence: Not At Risk (09/17/2022)   Humiliation, Afraid, Rape, and Kick questionnaire    Fear of Current or Ex-Partner: No    Emotionally Abused: No  Physically Abused:  No    Sexually Abused: No    FAMILY HISTORY: Family History  Problem Relation Age of Onset   Breast cancer Sister        in her 52's-70s   Heart disease Brother        CABG   Heart disease Brother        CABG   Skin cancer Brother        melanoma   Colon cancer Cousin    Heart attack Mother    Heart attack Father    Prostate cancer Brother     Review of Systems  Constitutional:  Negative for appetite change, chills, fatigue, fever and unexpected weight change.  HENT:   Negative for hearing loss, lump/mass and trouble swallowing.   Eyes:  Negative for eye problems and icterus.  Respiratory:  Negative for chest tightness, cough and shortness of breath.   Cardiovascular:  Negative for chest pain, leg swelling and palpitations.  Gastrointestinal:  Negative for abdominal distention, abdominal pain, constipation, diarrhea, nausea and vomiting.  Endocrine: Negative for hot flashes.  Genitourinary:  Negative for difficulty urinating.   Musculoskeletal:  Negative for arthralgias.  Skin:  Negative for itching and rash.  Neurological:  Negative for dizziness, extremity weakness, headaches and numbness.  Hematological:  Negative for adenopathy. Does not bruise/bleed easily.  Psychiatric/Behavioral:  Negative for depression. The patient is not nervous/anxious.       PHYSICAL EXAMINATION    Vitals:   06/25/23 1322  BP: (!) 140/72  Pulse: 74  Resp: 16  Temp: 98.2 F (36.8 C)  SpO2: 100%    Physical Exam Constitutional:      General: She is not in acute distress.    Appearance: Normal appearance. She is not toxic-appearing.  HENT:     Head: Normocephalic and atraumatic.     Mouth/Throat:     Mouth: Mucous membranes are moist.     Pharynx: Oropharynx is clear. No oropharyngeal exudate or posterior oropharyngeal erythema.  Eyes:     General: No scleral icterus. Cardiovascular:     Rate and Rhythm: Normal rate and regular rhythm.     Pulses: Normal pulses.     Heart  sounds: Normal heart sounds.  Pulmonary:     Effort: Pulmonary effort is normal.     Breath sounds: Normal breath sounds.  Abdominal:     General: Abdomen is flat. Bowel sounds are normal. There is no distension.     Palpations: Abdomen is soft.     Tenderness: There is no abdominal tenderness.  Musculoskeletal:        General: No swelling.     Cervical back: Neck supple.  Lymphadenopathy:     Cervical: No cervical adenopathy.  Skin:    General: Skin is warm and dry.     Findings: No rash.  Neurological:     General: No focal deficit present.     Mental Status: She is alert.  Psychiatric:        Mood and Affect: Mood normal.        Behavior: Behavior normal.     LABORATORY DATA:  CBC    Component Value Date/Time   WBC 5.9 06/25/2023 1255   WBC 5.1 10/16/2022 1301   RBC 3.69 (L) 06/25/2023 1255   HGB 8.7 (L) 06/25/2023 1255   HCT 29.2 (L) 06/25/2023 1255   PLT 264 06/25/2023 1255   MCV 79.1 (L) 06/25/2023 1255   MCH 23.6 (L) 06/25/2023 1255  MCHC 29.8 (L) 06/25/2023 1255   RDW 18.6 (H) 06/25/2023 1255   LYMPHSABS 1.4 06/25/2023 1255   MONOABS 0.5 06/25/2023 1255   EOSABS 0.4 06/25/2023 1255   BASOSABS 0.1 06/25/2023 1255    CMP     Component Value Date/Time   NA 139 06/25/2023 1255   K 3.9 06/25/2023 1255   CL 103 06/25/2023 1255   CO2 29 06/25/2023 1255   GLUCOSE 114 (H) 06/25/2023 1255   BUN 14 06/25/2023 1255   CREATININE 1.07 (H) 06/25/2023 1255   CALCIUM 9.2 06/25/2023 1255   PROT 6.6 06/25/2023 1255   ALBUMIN 3.7 06/25/2023 1255   AST 36 06/25/2023 1255   ALT 20 06/25/2023 1255   ALKPHOS 123 06/25/2023 1255   BILITOT 0.5 06/25/2023 1255   GFRNONAA 58 (L) 06/25/2023 1255   GFRAA >60 02/24/2020 1454   GFRAA >60 07/24/2019 1453      ASSESSMENT and THERAPY PLAN:   Carcinoma of breast, estrogen receptor positive, stage 4, right (HCC) Suzanne [name] is a 63 year old woman with metastatic breast cancer here today for f/u on treatment with Fulvestrant  and Truqap.    Metastatic Breast Cancer No clinical or radiographic signs of metastatic breast cancer progression. Tolerating Fulvestrant and TruCapt well.  -Continue Fulvestrant and TruCapt as prescribed.   Dental Health Needs extraction.  This is not yet been scheduled. -Continue to hold Xgeva due to risk of osteonecrosis of the jaw  Anemia Hemoglobin 8.7, stable. -Monitor hemoglobin levels closely.  Cancer related pain Slightly increased related to barometric pressure changes.  PMP aware reviewed, no red flags, -Tramadol refilled #[phone]  RTC in 4 weeks for labs, f/u and injection   All questions were answered. The patient knows to call the clinic with any problems, questions or concerns. We can certainly see the patient much sooner if necessary.  Total encounter time:30 minutes*in face-to-face visit time, chart review, lab review, care coordination, order entry, and documentation of the encounter time.    Lillard Anes, Suzanne [name] 06/25/23 2:49 PM Medical Oncology and Hematology [facility] [address] [place], [place] [address] Tel. [phone]    Fax. [phone]  *Total Encounter Time as defined by the Centers for Medicare and Medicaid Services includes, in addition to the face-to-face time of a patient visit (documented in the note above) non-face-to-face time: obtaining and reviewing outside history, ordering and reviewing medications, tests or procedures, care coordination (communications with other health care professionals or caregivers) and documentation in the medical record.

## 2023-06-26 ENCOUNTER — Other Ambulatory Visit: Payer: Self-pay

## 2023-06-26 DIAGNOSIS — C7951 Secondary malignant neoplasm of bone: Secondary | ICD-10-CM | POA: Diagnosis not present

## 2023-06-26 LAB — CANCER ANTIGEN 27.29: CA 27.29: 515.6 U/mL — ABNORMAL HIGH (ref 0.0–38.6)

## 2023-06-27 DIAGNOSIS — C7951 Secondary malignant neoplasm of bone: Secondary | ICD-10-CM | POA: Diagnosis not present

## 2023-06-28 DIAGNOSIS — C7951 Secondary malignant neoplasm of bone: Secondary | ICD-10-CM | POA: Diagnosis not present

## 2023-06-29 DIAGNOSIS — J9621 Acute and chronic respiratory failure with hypoxia: Secondary | ICD-10-CM | POA: Diagnosis not present

## 2023-06-29 DIAGNOSIS — S22000A Wedge compression fracture of unspecified thoracic vertebra, initial encounter for closed fracture: Secondary | ICD-10-CM | POA: Diagnosis not present

## 2023-06-29 DIAGNOSIS — R0602 Shortness of breath: Secondary | ICD-10-CM | POA: Diagnosis not present

## 2023-06-29 DIAGNOSIS — C7801 Secondary malignant neoplasm of right lung: Secondary | ICD-10-CM | POA: Diagnosis not present

## 2023-07-01 DIAGNOSIS — C7951 Secondary malignant neoplasm of bone: Secondary | ICD-10-CM | POA: Diagnosis not present

## 2023-07-02 ENCOUNTER — Other Ambulatory Visit: Payer: Self-pay | Admitting: Hematology and Oncology

## 2023-07-02 DIAGNOSIS — C7951 Secondary malignant neoplasm of bone: Secondary | ICD-10-CM

## 2023-07-02 NOTE — Progress Notes (Signed)
Re staging scans ordered.  Suzanne [name]

## 2023-07-04 DIAGNOSIS — C7951 Secondary malignant neoplasm of bone: Secondary | ICD-10-CM | POA: Diagnosis not present

## 2023-07-05 ENCOUNTER — Other Ambulatory Visit (HOSPITAL_COMMUNITY): Payer: Self-pay

## 2023-07-05 DIAGNOSIS — C7951 Secondary malignant neoplasm of bone: Secondary | ICD-10-CM | POA: Diagnosis not present

## 2023-07-08 DIAGNOSIS — C7951 Secondary malignant neoplasm of bone: Secondary | ICD-10-CM | POA: Diagnosis not present

## 2023-07-09 ENCOUNTER — Telehealth: Payer: Self-pay | Admitting: Hematology and Oncology

## 2023-07-09 DIAGNOSIS — C7951 Secondary malignant neoplasm of bone: Secondary | ICD-10-CM | POA: Diagnosis not present

## 2023-07-09 NOTE — Telephone Encounter (Signed)
Spoke with patient confirming upcoming appointment changes

## 2023-07-10 DIAGNOSIS — C7951 Secondary malignant neoplasm of bone: Secondary | ICD-10-CM | POA: Diagnosis not present

## 2023-07-11 DIAGNOSIS — C7951 Secondary malignant neoplasm of bone: Secondary | ICD-10-CM | POA: Diagnosis not present

## 2023-07-12 DIAGNOSIS — C7951 Secondary malignant neoplasm of bone: Secondary | ICD-10-CM | POA: Diagnosis not present

## 2023-07-15 DIAGNOSIS — C7951 Secondary malignant neoplasm of bone: Secondary | ICD-10-CM | POA: Diagnosis not present

## 2023-07-16 DIAGNOSIS — C7951 Secondary malignant neoplasm of bone: Secondary | ICD-10-CM | POA: Diagnosis not present

## 2023-07-17 DIAGNOSIS — C7951 Secondary malignant neoplasm of bone: Secondary | ICD-10-CM | POA: Diagnosis not present

## 2023-07-18 ENCOUNTER — Ambulatory Visit: Payer: Medicaid Other | Admitting: Dermatology

## 2023-07-19 ENCOUNTER — Other Ambulatory Visit (HOSPITAL_COMMUNITY): Payer: Self-pay

## 2023-07-19 DIAGNOSIS — C7951 Secondary malignant neoplasm of bone: Secondary | ICD-10-CM | POA: Diagnosis not present

## 2023-07-22 DIAGNOSIS — C7951 Secondary malignant neoplasm of bone: Secondary | ICD-10-CM | POA: Diagnosis not present

## 2023-07-23 ENCOUNTER — Inpatient Hospital Stay: Payer: Medicaid Other

## 2023-07-23 ENCOUNTER — Inpatient Hospital Stay: Payer: Medicaid Other | Admitting: Hematology and Oncology

## 2023-07-23 ENCOUNTER — Other Ambulatory Visit: Payer: Self-pay

## 2023-07-23 ENCOUNTER — Other Ambulatory Visit: Payer: Self-pay | Admitting: Hematology and Oncology

## 2023-07-23 DIAGNOSIS — H43813 Vitreous degeneration, bilateral: Secondary | ICD-10-CM | POA: Diagnosis not present

## 2023-07-23 DIAGNOSIS — H0014 Chalazion left upper eyelid: Secondary | ICD-10-CM | POA: Diagnosis not present

## 2023-07-23 DIAGNOSIS — H40013 Open angle with borderline findings, low risk, bilateral: Secondary | ICD-10-CM | POA: Diagnosis not present

## 2023-07-23 DIAGNOSIS — Z961 Presence of intraocular lens: Secondary | ICD-10-CM | POA: Diagnosis not present

## 2023-07-23 DIAGNOSIS — C7951 Secondary malignant neoplasm of bone: Secondary | ICD-10-CM | POA: Diagnosis not present

## 2023-07-23 DIAGNOSIS — H0102A Squamous blepharitis right eye, upper and lower eyelids: Secondary | ICD-10-CM | POA: Diagnosis not present

## 2023-07-23 DIAGNOSIS — H0102B Squamous blepharitis left eye, upper and lower eyelids: Secondary | ICD-10-CM | POA: Diagnosis not present

## 2023-07-23 NOTE — Progress Notes (Signed)
 Specialty Pharmacy Refill Coordination Note  Suzanne [name] is a 63 y.o. female contacted today regarding refills of specialty medication(s) Capivasertib   Patient requested Delivery   Delivery date: 07/31/23   Verified address: [address]  [place] [address]   Medication will be filled on 07/30/23.

## 2023-07-24 ENCOUNTER — Inpatient Hospital Stay: Payer: Medicaid Other

## 2023-07-24 ENCOUNTER — Other Ambulatory Visit: Payer: Self-pay

## 2023-07-24 ENCOUNTER — Ambulatory Visit (HOSPITAL_COMMUNITY)
Admission: RE | Admit: 2023-07-24 | Discharge: 2023-07-24 | Disposition: A | Discharge status: Home / Self Care | Payer: Medicaid Other | Source: Ambulatory Visit | Attending: Hematology and Oncology | Admitting: Hematology and Oncology

## 2023-07-24 ENCOUNTER — Inpatient Hospital Stay (HOSPITAL_BASED_OUTPATIENT_CLINIC_OR_DEPARTMENT_OTHER): Payer: Medicaid Other | Admitting: Hematology and Oncology

## 2023-07-24 ENCOUNTER — Inpatient Hospital Stay: Payer: Medicaid Other | Attending: Adult Health

## 2023-07-24 VITALS — BP 140/77 | HR 108 | Temp 98.4°F | Resp 18 | Ht 59.0 in | Wt 179.6 lb

## 2023-07-24 DIAGNOSIS — C7951 Secondary malignant neoplasm of bone: Secondary | ICD-10-CM | POA: Insufficient documentation

## 2023-07-24 DIAGNOSIS — C50811 Malignant neoplasm of overlapping sites of right female breast: Secondary | ICD-10-CM | POA: Diagnosis present

## 2023-07-24 DIAGNOSIS — C7800 Secondary malignant neoplasm of unspecified lung: Secondary | ICD-10-CM | POA: Diagnosis not present

## 2023-07-24 DIAGNOSIS — C779 Secondary and unspecified malignant neoplasm of lymph node, unspecified: Secondary | ICD-10-CM | POA: Insufficient documentation

## 2023-07-24 DIAGNOSIS — Z170 Estrogen receptor positive status [ER+]: Secondary | ICD-10-CM | POA: Diagnosis not present

## 2023-07-24 DIAGNOSIS — Z5111 Encounter for antineoplastic chemotherapy: Secondary | ICD-10-CM | POA: Insufficient documentation

## 2023-07-24 DIAGNOSIS — C787 Secondary malignant neoplasm of liver and intrahepatic bile duct: Secondary | ICD-10-CM | POA: Insufficient documentation

## 2023-07-24 LAB — CBC WITH DIFFERENTIAL (CANCER CENTER ONLY)
Abs Immature Granulocytes: 0.03 10*3/uL (ref 0.00–0.07)
Basophils Absolute: 0.1 10*3/uL (ref 0.0–0.1)
Basophils Relative: 1 %
Eosinophils Absolute: 0.3 10*3/uL (ref 0.0–0.5)
Eosinophils Relative: 5 %
HCT: 29.6 % — ABNORMAL LOW (ref 36.0–46.0)
Hemoglobin: 8.5 g/dL — ABNORMAL LOW (ref 12.0–15.0)
Immature Granulocytes: 1 %
Lymphocytes Relative: 20 %
Lymphs Abs: 1.3 10*3/uL (ref 0.7–4.0)
MCH: 23.8 pg — ABNORMAL LOW (ref 26.0–34.0)
MCHC: 28.7 g/dL — ABNORMAL LOW (ref 30.0–36.0)
MCV: 82.9 fL (ref 80.0–100.0)
Monocytes Absolute: 0.4 10*3/uL (ref 0.1–1.0)
Monocytes Relative: 7 %
Neutro Abs: 4.1 10*3/uL (ref 1.7–7.7)
Neutrophils Relative %: 66 %
Platelet Count: 290 10*3/uL (ref 150–400)
RBC: 3.57 MIL/uL — ABNORMAL LOW (ref 3.87–5.11)
RDW: 18.5 % — ABNORMAL HIGH (ref 11.5–15.5)
WBC Count: 6.2 10*3/uL (ref 4.0–10.5)
nRBC: 0 % (ref 0.0–0.2)

## 2023-07-24 LAB — CMP (CANCER CENTER ONLY)
ALT: 18 U/L (ref 0–44)
AST: 30 U/L (ref 15–41)
Albumin: 3.7 g/dL (ref 3.5–5.0)
Alkaline Phosphatase: 132 U/L — ABNORMAL HIGH (ref 38–126)
Anion gap: 5 (ref 5–15)
BUN: 21 mg/dL (ref 8–23)
CO2: 31 mmol/L (ref 22–32)
Calcium: 9.3 mg/dL (ref 8.9–10.3)
Chloride: 105 mmol/L (ref 98–111)
Creatinine: 1.09 mg/dL — ABNORMAL HIGH (ref 0.44–1.00)
GFR, Estimated: 57 mL/min — ABNORMAL LOW (ref >60)
Glucose, Bld: 157 mg/dL — ABNORMAL HIGH (ref 70–99)
Potassium: 4 mmol/L (ref 3.5–5.1)
Sodium: 141 mmol/L (ref 135–145)
Total Bilirubin: 0.4 mg/dL (ref 0.0–1.2)
Total Protein: 6.6 g/dL (ref 6.5–8.1)

## 2023-07-24 LAB — VITAMIN B12: Vitamin B-12: 2495 pg/mL — ABNORMAL HIGH (ref 180–914)

## 2023-07-24 MED ORDER — FULVESTRANT 250 MG/5ML IM SOSY
500.0000 mg | PREFILLED_SYRINGE | Freq: Once | INTRAMUSCULAR | Status: AC
  Administered 2023-07-24: 500 mg via INTRAMUSCULAR
  Filled 2023-07-24: qty 10

## 2023-07-24 MED ORDER — TRUQAP 200 MG PO TBPK
200.0000 mg | ORAL_TABLET | Freq: Two times a day (BID) | ORAL | 0 refills | Status: DC
  Filled 2023-07-24: qty 64, 28d supply, fill #0

## 2023-07-24 NOTE — Progress Notes (Signed)
  Cancer Center Cancer Follow up:    Suzanne Rigg, NP [address] [place] [address] [place] [address]   DIAGNOSIS:  Cancer Staging  Carcinoma of breast, estrogen receptor positive, stage 4, right (HCC) Staging form: Breast, AJCC 8th Edition - Clinical: Stage IV (pM1) - Signed by Loa Socks, NP on 02/15/2023   SUMMARY OF ONCOLOGIC HISTORY: [place], [place] woman presenting 10/26/2018 with right-sided inflammatory breast cancer, stage IV, involving lungs, lymph nodes and bones, as follows:             (a) chest CT scan 10/26/2018 shows bilateral pleural effusions, possible lymphangitic spread of tumor, diffuse bony metastatic disease, and significant axillary mediastinal and hilar adenopathy             (b) bone scan 10/27/2018 is a "near SuperScan" consistent with widespread bony metastatic disease             (c) head CT with and without contrast 10/30/2018 shows no intracranial metastatic disease, multiple calvarial lesions             (d) CA-27-29 on 10/27/2018 was 1810.4   (1) pleural fluid from right thoracentesis 10/28/2018 confirms malignant cells consistent with a breast primary, strongly estrogen and progesterone receptor positive, HER-2 not amplified, with an MIB-1 of 2%   (2) anastrozole started 10/29/2018             (a) palbociclib started 11/13/2018 at 100 mg dose             (b) denosumab/xgeva started 11/13/2018              (C) palbociclib dose decreased to 75mg              (D) Palbociclib dose decreased to 75 mg every other day in 11/2021   (3) associated problems:             (a) hypoxia secondary to effusions             (b) pain from bone lesions and left humeral and vertebral compression fractures             (c) right upper extremity lymphedema             (d) poor venous access   (4) genetics testing on 12/25/2018 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 STK11, TP53, TSC1, TSC2, and VHL.  The following genes were evaluated for sequence changes only: SDHA and HOXB13 c.251G>A variant only. The [facility] Melanoma Panel analyzed the following 9 genes: BAP1 BRCA2 CDK4 CDKN2A MITF POT1 PTEN RB1 Tp53.    (5) palliative radiation to the left humerus 07/20/2019 - 07/31/2019 Site Technique Total Dose (Gy) Dose per Fx (Gy) Completed Fx Beam Energies  Humerus, Left: Ext_Lt_humerus Complex 30/30 3 10/10 6X    (6) anemia requiring transfusion:              (a) B-12 level 134 FEB 2022: B-12 injectons started              (  B) iron deficient, IV iron to start 2/6             (C) Hospitalized with upper GI bleed 05/27/2022-05/30/2022   (7) Foundation one testing showed MSS, TMB 69muts/Mb, ESR1 mutation, AKT 1 mutation    (7) Elacestrant started 04/30/2022, progression noted on imaging from May 2024   (8) Capivasertib, Fulvestrant 10/25/2022    CURRENT THERAPY: Capivasertib/Truqap, fulvestrant,(Xgeva on hold)  INTERVAL HISTORY:  Suzanne [name] 63 y.o. female returns for follow-up of her metastatic breast cancer.    Suzanne [name] is a 63 year old female with breast cancer who presents with palpitations and left arm pain. She is accompanied by Gavin Pound, her family member.  She is currently undergoing treatment with Truqap and Faslodex for breast cancer with metastasis. There is concern about the effectiveness of the treatment as her tumor marker is slowly increasing. She has previously been treated with  Ibrance and anastrozole, Faslodex, and Orserdu, which were not effective long-term.  She experiences significant pain in her left arm, specifically above the elbow, where she previously had a pathological fracture due to cancer. A steel plate was placed in August 2020. The pain has been persistent and is not related to weather changes. She is concerned about the pain and the need for further evaluation.  She experienced a 'funny feeling' in her chest a couple of days ago, described as an awareness of her heartbeat, lasting about ten minutes, and it has not recurred since. No pain was associated with this sensation. She is currently taking Truqap 200 mg once in the morning and once at night, with no reported side effects such as diarrhea. She has a family history of heart issues.  No changes in breathing, urinary habits, new pains, headaches, double vision, falls, abdominal pain, or trouble urinating.  Rest of the pertinent 10 point ROS reviewed and neg.  Patient Active Problem List   Diagnosis Date Noted   UGI bleed 05/27/2022   Acute prerenal azotemia 04/18/2022   Dehydration 04/18/2022   SIRS (systemic inflammatory response syndrome) (HCC) 04/18/2022   Allergic rhinitis 04/18/2022   Depression 04/18/2022   Anemia of chronic disease 04/18/2022   Bronchitis 04/18/2022   Acute bronchitis 04/17/2022   Intertrigo 01/10/2022   Bacterial vaginitis 01/10/2022   Sepsis due to cellulitis (HCC) 12/06/2021   B12 deficiency anemia 08/15/2020   Sensorineural hearing loss (SNHL) of both ears 08/07/2019   Intractable back pain 02/03/2019   Thoracic compression fracture, closed, initial encounter (HCC) 02/03/2019   Essential hypertension 02/03/2019   Genetic testing 01/27/2019   Acute pharyngitis    Bacteremia    FTT (failure to thrive) in adult    Fracture closed, humerus, shaft 01/09/2019   Pneumonia 12/25/2018   Acute respiratory disorder in immunocompromised patient (HCC) 12/25/2018   Family  history of breast cancer    Family history of prostate cancer    Family history of melanoma    Family history of colon cancer    Pressure injury of skin 11/24/2018   Chronic respiratory failure with hypoxia and hypercapnia (HCC) 11/19/2018   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   Hypophosphatemia 11/19/2018   Hypocalcemia 11/19/2018   Aspiration pneumonia (HCC) 11/19/2018   SOB (shortness of breath)    Palliative care by specialist    Carcinoma of breast, estrogen receptor positive, stage 4, right (HCC) 11/16/2018   Hypoalbuminemia 11/16/2018   Pleural effusion 11/14/2018   Goals of care, counseling/discussion 11/06/2018   Malignant neoplasm of overlapping sites of  right breast in female, estrogen receptor positive (HCC) 10/31/2018   Malignant neoplasm metastatic to lung (HCC) 10/31/2018   Pain from bone metastases (HCC) 10/31/2018   Class 2 obesity 10/31/2018   Bone injury    Pleural effusion, bilateral    Shortness of breath    Abnormal breast finding    Hypokalemia    Breast skin changes    Macrocytic anemia    Malignant neoplasm metastatic to bone (HCC)    Anemia, B12 deficiency 10/26/2018    is allergic to ferumoxytol.  MEDICAL HISTORY: Past Medical History:  Diagnosis Date   Anemia    Cancer (HCC)    Family history of breast cancer    Family history of colon cancer    Family history of melanoma    Family history of prostate cancer    Gallstones    History of hiatal hernia    History of kidney stones    Mastitis    reports history of recurrent mastitis   Metastatic breast cancer    Obesity    Psoriasis     SURGICAL HISTORY: Past Surgical History:  Procedure Laterality Date   CYSTOSCOPY/URETEROSCOPY/HOLMIUM LASER/STENT PLACEMENT Left 10/17/2022   Procedure: CYSTOSCOPY, LEFT RETROGRADE PYELOGRAM, LEFT URETEROSCOPY, HOLMIUM LASER LITHOTRIPSY, BASKET EXTRACTION, AND LEFT URETERAL STENT PLACEMENT;  Surgeon: Rene Paci, MD;  Location: [facility];   Service: Urology;  Laterality: Left;  45 MINUTES   ESOPHAGOGASTRODUODENOSCOPY (EGD) WITH PROPOFOL N/A 05/27/2022   Procedure: ESOPHAGOGASTRODUODENOSCOPY (EGD) WITH PROPOFOL;  Surgeon: Willis Modena, MD;  Location: [facility];  Service: Gastroenterology;  Laterality: N/A;   EYE SURGERY     cataract   GLAUCOMA SURGERY  late 1990's   IR THORACENTESIS ASP PLEURAL SPACE W/IMG GUIDE  10/28/2018   IR THORACENTESIS ASP PLEURAL SPACE W/IMG GUIDE  10/29/2018   OPEN REDUCTION INTERNAL FIXATION (ORIF) DISTAL RADIAL FRACTURE Left 01/09/2019   Procedure: OPEN REDUCTION INTERNAL FIXATION (ORIF) HUMERAL FRACTURE;  Surgeon: Beverely Low, MD;  Location: [facility];  Service: Orthopedics;  Laterality: Left;    SOCIAL HISTORY: Social History   Socioeconomic History   Marital status: Widowed    Spouse name: Not on file   Number of children: 1   Years of education: Not on file   Highest education level: Not on file  Occupational History   Occupation: Visual merchandiser Programs    Comment: Self-employed  Tobacco Use   Smoking status: Never   Smokeless tobacco: Never   Tobacco comments:    second hand smoke exposure  Vaping Use   Vaping status: Never Used  Substance and Sexual Activity   Alcohol use: No   Drug use: No   Sexual activity: Not Currently  Other Topics Concern   Not on file  Social History Narrative   Has a daughter named [name]. Daughter has CP.   Social Drivers of Corporate investment banker Strain: Low Risk  (09/17/2022)   Overall Financial Resource Strain (CARDIA)    Difficulty of Paying Living Expenses: Not hard at all  Food Insecurity: No Food Insecurity (09/17/2022)   Hunger Vital Sign    Worried About Running Out of Food in the Last Year: Never true    Ran Out of Food in the Last Year: Never true  Transportation Needs: No Transportation Needs (09/17/2022)   PRAPARE - Administrator, Civil Service (Medical): No    Lack of Transportation (Non-Medical): No   Physical Activity: Inactive (03/07/2023)   Exercise Vital Sign  Days of Exercise per Week: 0 days    Minutes of Exercise per Session: 0 min  Stress: Stress Concern Present (03/07/2023)   [employer] of Occupational Health - Occupational Stress Questionnaire    Feeling of Stress : To some extent  Social Connections: Socially Isolated (03/07/2023)   Social Connection and Isolation Panel [NHANES]    Frequency of Communication with Friends and Family: Once a week    Frequency of Social Gatherings with Friends and Family: Once a week    Attends Religious Services: Never    Database administrator or Organizations: Yes    Attends Banker Meetings: Never    Marital Status: Widowed  Intimate Partner Violence: Not At Risk (09/17/2022)   Humiliation, Afraid, Rape, and Kick questionnaire    Fear of Current or Ex-Partner: No    Emotionally Abused: No    Physically Abused: No    Sexually Abused: No    FAMILY HISTORY: Family History  Problem Relation Age of Onset   Breast cancer Sister        in her 39's-70s   Heart disease Brother        CABG   Heart disease Brother        CABG   Skin cancer Brother        melanoma   Colon cancer Cousin    Heart attack Mother    Heart attack Father    Prostate cancer Brother     Review of Systems  Constitutional:  Negative for appetite change, chills, fatigue, fever and unexpected weight change.  HENT:   Negative for hearing loss, lump/mass and trouble swallowing.   Eyes:  Negative for eye problems and icterus.  Respiratory:  Negative for chest tightness, cough and shortness of breath.   Cardiovascular:  Negative for chest pain, leg swelling and palpitations.  Gastrointestinal:  Negative for abdominal distention, abdominal pain, constipation, diarrhea, nausea and vomiting.  Endocrine: Negative for hot flashes.  Genitourinary:  Negative for difficulty urinating.   Musculoskeletal:  Negative for arthralgias.  Skin:  Negative  for itching and rash.  Neurological:  Negative for dizziness, extremity weakness, headaches and numbness.  Hematological:  Negative for adenopathy. Does not bruise/bleed easily.  Psychiatric/Behavioral:  Negative for depression. The patient is not nervous/anxious.       PHYSICAL EXAMINATION    Vitals:   07/24/23 1424  BP: (!) 140/77  Pulse: (!) 108  Resp: 18  Temp: 98.4 F (36.9 C)  SpO2: 99%    Physical Exam Constitutional:      General: She is not in acute distress.    Appearance: Normal appearance. She is not toxic-appearing.  HENT:     Head: Normocephalic and atraumatic.     Mouth/Throat:     Mouth: Mucous membranes are moist.     Pharynx: Oropharynx is clear. No oropharyngeal exudate or posterior oropharyngeal erythema.  Eyes:     General: No scleral icterus. Cardiovascular:     Rate and Rhythm: Normal rate and regular rhythm.     Pulses: Normal pulses.     Heart sounds: Normal heart sounds.  Pulmonary:     Effort: Pulmonary effort is normal.     Breath sounds: Normal breath sounds.  Abdominal:     General: Abdomen is flat. Bowel sounds are normal. There is no distension.     Palpations: Abdomen is soft.     Tenderness: There is no abdominal tenderness.  Musculoskeletal:        General:  No swelling.     Cervical back: Neck supple.  Lymphadenopathy:     Cervical: No cervical adenopathy.  Skin:    General: Skin is warm and dry.     Findings: No rash.  Neurological:     General: No focal deficit present.     Mental Status: She is alert.  Psychiatric:        Mood and Affect: Mood normal.        Behavior: Behavior normal.     LABORATORY DATA:  CBC    Component Value Date/Time   WBC 6.2 07/24/2023 1400   WBC 5.1 10/16/2022 1301   RBC 3.57 (L) 07/24/2023 1400   HGB 8.5 (L) 07/24/2023 1400   HCT 29.6 (L) 07/24/2023 1400   PLT 290 07/24/2023 1400   MCV 82.9 07/24/2023 1400   MCH 23.8 (L) 07/24/2023 1400   MCHC 28.7 (L) 07/24/2023 1400   RDW 18.5  (H) 07/24/2023 1400   LYMPHSABS 1.3 07/24/2023 1400   MONOABS 0.4 07/24/2023 1400   EOSABS 0.3 07/24/2023 1400   BASOSABS 0.1 07/24/2023 1400    CMP     Component Value Date/Time   NA 141 07/24/2023 1400   K 4.0 07/24/2023 1400   CL 105 07/24/2023 1400   CO2 31 07/24/2023 1400   GLUCOSE 157 (H) 07/24/2023 1400   BUN 21 07/24/2023 1400   CREATININE 1.09 (H) 07/24/2023 1400   CALCIUM 9.3 07/24/2023 1400   PROT 6.6 07/24/2023 1400   ALBUMIN 3.7 07/24/2023 1400   AST 30 07/24/2023 1400   ALT 18 07/24/2023 1400   ALKPHOS 132 (H) 07/24/2023 1400   BILITOT 0.4 07/24/2023 1400   GFRNONAA 57 (L) 07/24/2023 1400   GFRAA >60 02/24/2020 1454   GFRAA >60 07/24/2019 1453      ASSESSMENT and THERAPY PLAN:   Malignant neoplasm metastatic to bone [facility]) Metastatic Breast Cancer Currently on Truqap 200mg  BID. Tolerating well with no side effects. Tumor marker slowly increasing, indicating possible disease progression. Upcoming scan in two days. -Continue Truqap 200mg  BID. -If scan indicates disease progression, consider Xeloda or compassionate use of Imlunestrant in combination with Verzenio.  Palpitations Single episode of palpitations lasting approximately 10 minutes. No recurrence. Possible causes include anemia. -Monitor for recurrence or increase in frequency/duration.  Left Arm Pain Pain above the elbow in the area of previous pathological fracture and plate placement (August 2020). -Order X-ray of left humerus to assess for any changes or complications related to previous fracture or hardware.  General Health Maintenance / Followup Plans -Administer scheduled shot today. -Follow-up in approximately 4 weeks or sooner if treatment plan changes.    All questions were answered. The patient knows to call the clinic with any problems, questions or concerns. We can certainly see the patient much sooner if necessary.  Total encounter time:30 minutes*in face-to-face visit time,  chart review, lab review, care coordination, order entry, and documentation of the encounter time.   *Total Encounter Time as defined by the Centers for Medicare and Medicaid Services includes, in addition to the face-to-face time of a patient visit (documented in the note above) non-face-to-face time: obtaining and reviewing outside history, ordering and reviewing medications, tests or procedures, care coordination (communications with other health care professionals or caregivers) and documentation in the medical record.

## 2023-07-24 NOTE — Assessment & Plan Note (Signed)
 Metastatic Breast Cancer Currently on Truqap 200mg  BID. Tolerating well with no side effects. Tumor marker slowly increasing, indicating possible disease progression. Upcoming scan in two days. -Continue Truqap 200mg  BID. -If scan indicates disease progression, consider Xeloda or compassionate use of Imlunestrant in combination with Verzenio.  Palpitations Single episode of palpitations lasting approximately 10 minutes. No recurrence. Possible causes include anemia. -Monitor for recurrence or increase in frequency/duration.  Left Arm Pain Pain above the elbow in the area of previous pathological fracture and plate placement (August 2020). -Order X-ray of left humerus to assess for any changes or complications related to previous fracture or hardware.  General Health Maintenance / Followup Plans -Administer scheduled shot today. -Follow-up in approximately 4 weeks or sooner if treatment plan changes.

## 2023-07-25 ENCOUNTER — Encounter: Payer: Self-pay | Admitting: Hematology and Oncology

## 2023-07-25 DIAGNOSIS — C7951 Secondary malignant neoplasm of bone: Secondary | ICD-10-CM | POA: Diagnosis not present

## 2023-07-25 LAB — CANCER ANTIGEN 27.29: CA 27.29: 628.3 U/mL — ABNORMAL HIGH (ref 0.0–38.6)

## 2023-07-26 ENCOUNTER — Ambulatory Visit (HOSPITAL_COMMUNITY)
Admission: RE | Admit: 2023-07-26 | Discharge: 2023-07-26 | Disposition: A | Discharge status: Home / Self Care | Payer: Medicaid Other | Source: Ambulatory Visit | Attending: Hematology and Oncology | Admitting: Hematology and Oncology

## 2023-07-26 DIAGNOSIS — C7951 Secondary malignant neoplasm of bone: Secondary | ICD-10-CM | POA: Diagnosis not present

## 2023-07-26 MED ORDER — IOHEXOL 300 MG/ML  SOLN
100.0000 mL | Freq: Once | INTRAMUSCULAR | Status: AC | PRN
  Administered 2023-07-26: 100 mL via INTRAVENOUS

## 2023-07-27 DIAGNOSIS — S22000A Wedge compression fracture of unspecified thoracic vertebra, initial encounter for closed fracture: Secondary | ICD-10-CM | POA: Diagnosis not present

## 2023-07-27 DIAGNOSIS — C7801 Secondary malignant neoplasm of right lung: Secondary | ICD-10-CM | POA: Diagnosis not present

## 2023-07-27 DIAGNOSIS — J9621 Acute and chronic respiratory failure with hypoxia: Secondary | ICD-10-CM | POA: Diagnosis not present

## 2023-07-27 DIAGNOSIS — R0602 Shortness of breath: Secondary | ICD-10-CM | POA: Diagnosis not present

## 2023-07-29 DIAGNOSIS — C7951 Secondary malignant neoplasm of bone: Secondary | ICD-10-CM | POA: Diagnosis not present

## 2023-07-30 ENCOUNTER — Other Ambulatory Visit: Payer: Self-pay

## 2023-07-30 DIAGNOSIS — C7951 Secondary malignant neoplasm of bone: Secondary | ICD-10-CM | POA: Diagnosis not present

## 2023-07-31 DIAGNOSIS — C7951 Secondary malignant neoplasm of bone: Secondary | ICD-10-CM | POA: Diagnosis not present

## 2023-08-01 DIAGNOSIS — C7951 Secondary malignant neoplasm of bone: Secondary | ICD-10-CM | POA: Diagnosis not present

## 2023-08-02 DIAGNOSIS — C7951 Secondary malignant neoplasm of bone: Secondary | ICD-10-CM | POA: Diagnosis not present

## 2023-08-05 DIAGNOSIS — C7951 Secondary malignant neoplasm of bone: Secondary | ICD-10-CM | POA: Diagnosis not present

## 2023-08-06 ENCOUNTER — Other Ambulatory Visit (HOSPITAL_COMMUNITY): Payer: Self-pay

## 2023-08-06 DIAGNOSIS — C7951 Secondary malignant neoplasm of bone: Secondary | ICD-10-CM | POA: Diagnosis not present

## 2023-08-07 DIAGNOSIS — C7951 Secondary malignant neoplasm of bone: Secondary | ICD-10-CM | POA: Diagnosis not present

## 2023-08-08 DIAGNOSIS — C7951 Secondary malignant neoplasm of bone: Secondary | ICD-10-CM | POA: Diagnosis not present

## 2023-08-09 DIAGNOSIS — C7951 Secondary malignant neoplasm of bone: Secondary | ICD-10-CM | POA: Diagnosis not present

## 2023-08-12 ENCOUNTER — Other Ambulatory Visit: Payer: Self-pay

## 2023-08-12 DIAGNOSIS — C7951 Secondary malignant neoplasm of bone: Secondary | ICD-10-CM | POA: Diagnosis not present

## 2023-08-13 DIAGNOSIS — C7951 Secondary malignant neoplasm of bone: Secondary | ICD-10-CM | POA: Diagnosis not present

## 2023-08-14 DIAGNOSIS — C7951 Secondary malignant neoplasm of bone: Secondary | ICD-10-CM | POA: Diagnosis not present

## 2023-08-15 DIAGNOSIS — C7951 Secondary malignant neoplasm of bone: Secondary | ICD-10-CM | POA: Diagnosis not present

## 2023-08-16 DIAGNOSIS — C7951 Secondary malignant neoplasm of bone: Secondary | ICD-10-CM | POA: Diagnosis not present

## 2023-08-21 ENCOUNTER — Ambulatory Visit: Payer: Medicaid Other | Admitting: Dermatology

## 2023-08-22 ENCOUNTER — Other Ambulatory Visit: Payer: Self-pay

## 2023-08-22 ENCOUNTER — Inpatient Hospital Stay (HOSPITAL_BASED_OUTPATIENT_CLINIC_OR_DEPARTMENT_OTHER): Payer: Medicaid Other | Admitting: Hematology and Oncology

## 2023-08-22 ENCOUNTER — Inpatient Hospital Stay: Payer: Medicaid Other

## 2023-08-22 ENCOUNTER — Inpatient Hospital Stay: Payer: Medicaid Other | Attending: Adult Health

## 2023-08-22 DIAGNOSIS — C7801 Secondary malignant neoplasm of right lung: Secondary | ICD-10-CM

## 2023-08-22 DIAGNOSIS — Z5111 Encounter for antineoplastic chemotherapy: Secondary | ICD-10-CM | POA: Insufficient documentation

## 2023-08-22 DIAGNOSIS — C7951 Secondary malignant neoplasm of bone: Secondary | ICD-10-CM | POA: Diagnosis not present

## 2023-08-22 DIAGNOSIS — C50811 Malignant neoplasm of overlapping sites of right female breast: Secondary | ICD-10-CM | POA: Diagnosis present

## 2023-08-22 DIAGNOSIS — Z79899 Other long term (current) drug therapy: Secondary | ICD-10-CM | POA: Insufficient documentation

## 2023-08-22 DIAGNOSIS — Z170 Estrogen receptor positive status [ER+]: Secondary | ICD-10-CM

## 2023-08-22 LAB — CMP (CANCER CENTER ONLY)
ALT: 20 U/L (ref 0–44)
AST: 28 U/L (ref 15–41)
Albumin: 3.7 g/dL (ref 3.5–5.0)
Alkaline Phosphatase: 140 U/L — ABNORMAL HIGH (ref 38–126)
Anion gap: 5 (ref 5–15)
BUN: 21 mg/dL (ref 8–23)
CO2: 31 mmol/L (ref 22–32)
Calcium: 9.4 mg/dL (ref 8.9–10.3)
Chloride: 105 mmol/L (ref 98–111)
Creatinine: 1.04 mg/dL — ABNORMAL HIGH (ref 0.44–1.00)
GFR, Estimated: >60 mL/min (ref >60)
Glucose, Bld: 123 mg/dL — ABNORMAL HIGH (ref 70–99)
Potassium: 3.8 mmol/L (ref 3.5–5.1)
Sodium: 141 mmol/L (ref 135–145)
Total Bilirubin: 0.4 mg/dL (ref 0.0–1.2)
Total Protein: 6.9 g/dL (ref 6.5–8.1)

## 2023-08-22 LAB — CBC WITH DIFFERENTIAL (CANCER CENTER ONLY)
Abs Immature Granulocytes: 0.03 10*3/uL (ref 0.00–0.07)
Basophils Absolute: 0 10*3/uL (ref 0.0–0.1)
Basophils Relative: 1 %
Eosinophils Absolute: 0.3 10*3/uL (ref 0.0–0.5)
Eosinophils Relative: 5 %
HCT: 31.2 % — ABNORMAL LOW (ref 36.0–46.0)
Hemoglobin: 8.9 g/dL — ABNORMAL LOW (ref 12.0–15.0)
Immature Granulocytes: 0 %
Lymphocytes Relative: 15 %
Lymphs Abs: 1 10*3/uL (ref 0.7–4.0)
MCH: 23.7 pg — ABNORMAL LOW (ref 26.0–34.0)
MCHC: 28.5 g/dL — ABNORMAL LOW (ref 30.0–36.0)
MCV: 83 fL (ref 80.0–100.0)
Monocytes Absolute: 0.4 10*3/uL (ref 0.1–1.0)
Monocytes Relative: 6 %
Neutro Abs: 4.9 10*3/uL (ref 1.7–7.7)
Neutrophils Relative %: 73 %
Platelet Count: 294 10*3/uL (ref 150–400)
RBC: 3.76 MIL/uL — ABNORMAL LOW (ref 3.87–5.11)
RDW: 18.1 % — ABNORMAL HIGH (ref 11.5–15.5)
WBC Count: 6.7 10*3/uL (ref 4.0–10.5)
nRBC: 0 % (ref 0.0–0.2)

## 2023-08-22 LAB — VITAMIN B12: Vitamin B-12: 2446 pg/mL — ABNORMAL HIGH (ref 180–914)

## 2023-08-22 MED ORDER — FULVESTRANT 250 MG/5ML IM SOSY
500.0000 mg | PREFILLED_SYRINGE | Freq: Once | INTRAMUSCULAR | Status: AC
  Administered 2023-08-22: 500 mg via INTRAMUSCULAR
  Filled 2023-08-22: qty 10

## 2023-08-22 NOTE — Assessment & Plan Note (Addendum)
 Metastatic Breast Cancer Currently on Truqap 200mg  BID and faslodex. Most recent imaging with progression with increased metastatic lesion size and rising tumor markers. Likely Current treatment ineffective. Recommended switch to Xeloda (capecitabine) for efficacy. - Discontinue current regimen once Xeloda is available. - Initiate Xeloda at 1000 mg/m2 standard dose, two weeks on, one week off. - Monitor for side effects: fatigue, nausea, vomiting, diarrhea, hand-foot syndrome, corononary vasospasm - Educated on rare serious side effects: chest pain,  fatal side effects in people with DYPD deficiency. - Continue Faslodex and Truqap until Xeloda is available, then discontinue. - Schedule follow-up every three weeks for progress and side effects. - Plan scan after three to four Xeloda cycles to assess efficacy.

## 2023-08-22 NOTE — Progress Notes (Signed)
 [facility] Cancer Follow up:    Suzanne Rigg, NP [address] [place] [address]   DIAGNOSIS:  Cancer Staging  Carcinoma of breast, estrogen receptor positive, stage 4, right (HCC) Staging form: Breast, AJCC 8th Edition - Clinical: Stage IV (pM1) - Signed by Loa Socks, NP on 02/15/2023   SUMMARY OF ONCOLOGIC HISTORY:  Suzanne [name], [place] woman presenting 10/26/2018 with right-sided inflammatory breast cancer, stage IV, involving lungs, lymph nodes and bones, as follows:             (a) chest CT scan 10/26/2018 shows bilateral pleural effusions, possible lymphangitic spread of tumor, diffuse bony metastatic disease, and significant axillary mediastinal and hilar adenopathy             (b) bone scan 10/27/2018 is a "near SuperScan" consistent with widespread bony metastatic disease             (c) head CT with and without contrast 10/30/2018 shows no intracranial metastatic disease, multiple calvarial lesions             (d) CA-27-29 on 10/27/2018 was 1810.4   (1) pleural fluid from right thoracentesis 10/28/2018 confirms malignant cells consistent with a breast primary, strongly estrogen and progesterone receptor positive, HER-2 not amplified, with an MIB-1 of 2%   (2) anastrozole started 10/29/2018             (a) palbociclib started 11/13/2018 at 100 mg dose             (b) denosumab/xgeva started 11/13/2018              (C) palbociclib dose decreased to 75mg              (D) Palbociclib dose decreased to 75 mg every other day in 11/2021   (3) associated problems:             (a) hypoxia secondary to effusions             (b) pain from bone lesions and left humeral and vertebral compression fractures             (c) right upper extremity lymphedema             (d) poor venous access   (4) genetics testing on 12/25/2018 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 STK11, TP53, TSC1, TSC2, and VHL.  The following genes were evaluated for sequence changes only: SDHA and HOXB13 c.251G>A variant only. The [facility] Melanoma Panel analyzed the following 9 genes: BAP1 BRCA2 CDK4 CDKN2A MITF POT1 PTEN RB1 Tp53.    (5) palliative radiation to the left humerus 07/20/2019 - 07/31/2019 Site Technique Total Dose (Gy) Dose per Fx (Gy) Completed Fx Beam Energies  Humerus, Left: Ext_Lt_humerus Complex 30/30 3 10/10 6X    (6) anemia requiring transfusion:              (a) B-12 level 134 FEB 2022: B-12 injectons started              (  B) iron deficient, IV iron to start 2/6             (C) Hospitalized with upper GI bleed 05/27/2022-05/30/2022   (7) Foundation one testing showed MSS, TMB 29muts/Mb, ESR1 mutation, AKT 1 mutation    (7) Elacestrant started 04/30/2022, progression noted on imaging from May 2024   (8) Capivasertib, Fulvestrant 10/25/2022    CURRENT THERAPY: Capivasertib/Truqap, fulvestrant,(Xgeva on hold)  INTERVAL HISTORY:  Suzanne [name] 63 y.o. female returns for follow-up of her metastatic breast cancer while on Truqap and faslodex.  Discussed the use of AI scribe software for clinical note transcription with the patient, who gave verbal consent to proceed.  History of Present Illness    A recent scan showed an increase in the size of a liver lesion from approximately three centimeters to four centimeters, with no growth observed in other areas. There is also concern about a slow rise in tumor  markers, indicating a potential progression  She feels 'pretty much the same' since her last visit, with a brief episode of fatigue earlier in the week that resolved after a day. No other significant changes in her condition have been noted.  She is scheduled to receive Faslodex today as part of her ongoing treatment.  A rash she experienced previously has improved significantly since starting medication for it. No current issues with the rash.  Rest of the pertinent 10 point ROS reviewed and neg.  Patient Active Problem List   Diagnosis Date Noted   UGI bleed 05/27/2022   Acute prerenal azotemia 04/18/2022   Dehydration 04/18/2022   SIRS (systemic inflammatory response syndrome) (HCC) 04/18/2022   Allergic rhinitis 04/18/2022   Depression 04/18/2022   Anemia of chronic disease 04/18/2022   Bronchitis 04/18/2022   Acute bronchitis 04/17/2022   Intertrigo 01/10/2022   Bacterial vaginitis 01/10/2022   Sepsis due to cellulitis (HCC) 12/06/2021   B12 deficiency anemia 08/15/2020   Sensorineural hearing loss (SNHL) of both ears 08/07/2019   Intractable back pain 02/03/2019   Thoracic compression fracture, closed, initial encounter (HCC) 02/03/2019   Essential hypertension 02/03/2019   Genetic testing 01/27/2019   Acute pharyngitis    Bacteremia    FTT (failure to thrive) in adult    Fracture closed, humerus, shaft 01/09/2019   Pneumonia 12/25/2018   Acute respiratory disorder in immunocompromised patient (HCC) 12/25/2018   Family history of breast cancer    Family history of prostate cancer    Family history of melanoma    Family history of colon cancer    Pressure injury of skin 11/24/2018   Chronic respiratory failure with hypoxia and hypercapnia (HCC) 11/19/2018   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   Hypophosphatemia 11/19/2018   Hypocalcemia 11/19/2018   Aspiration pneumonia (HCC) 11/19/2018   SOB (shortness of breath)    Palliative care by specialist    Carcinoma of  breast, estrogen receptor positive, stage 4, right (HCC) 11/16/2018   Hypoalbuminemia 11/16/2018   Pleural effusion 11/14/2018   Goals of care, counseling/discussion 11/06/2018   Malignant neoplasm of overlapping sites of right breast in female, estrogen receptor positive (HCC) 10/31/2018   Malignant neoplasm metastatic to lung (HCC) 10/31/2018   Pain from bone metastases (HCC) 10/31/2018   Class 2 obesity 10/31/2018   Bone injury    Pleural effusion, bilateral    Shortness of breath    Abnormal breast finding    Hypokalemia    Breast skin changes    Macrocytic anemia    Malignant neoplasm metastatic  to bone (HCC)    Anemia, B12 deficiency 10/26/2018    is allergic to ferumoxytol.  MEDICAL HISTORY: Past Medical History:  Diagnosis Date   Anemia    Cancer (HCC)    Family history of breast cancer    Family history of colon cancer    Family history of melanoma    Family history of prostate cancer    Gallstones    History of hiatal hernia    History of kidney stones    Mastitis    reports history of recurrent mastitis   Metastatic breast cancer    Obesity    Psoriasis     SURGICAL HISTORY: Past Surgical History:  Procedure Laterality Date   CYSTOSCOPY/URETEROSCOPY/HOLMIUM LASER/STENT PLACEMENT Left 10/17/2022   Procedure: CYSTOSCOPY, LEFT RETROGRADE PYELOGRAM, LEFT URETEROSCOPY, HOLMIUM LASER LITHOTRIPSY, BASKET EXTRACTION, AND LEFT URETERAL STENT PLACEMENT;  Surgeon: Rene Paci, MD;  Location: [facility];  Service: Urology;  Laterality: Left;  45 MINUTES   ESOPHAGOGASTRODUODENOSCOPY (EGD) WITH PROPOFOL N/A 05/27/2022   Procedure: ESOPHAGOGASTRODUODENOSCOPY (EGD) WITH PROPOFOL;  Surgeon: Willis Modena, MD;  Location: [facility];  Service: Gastroenterology;  Laterality: N/A;   EYE SURGERY     cataract   GLAUCOMA SURGERY  late 1990's   IR THORACENTESIS ASP PLEURAL SPACE W/IMG GUIDE  10/28/2018   IR THORACENTESIS ASP PLEURAL SPACE W/IMG GUIDE  10/29/2018    OPEN REDUCTION INTERNAL FIXATION (ORIF) DISTAL RADIAL FRACTURE Left 01/09/2019   Procedure: OPEN REDUCTION INTERNAL FIXATION (ORIF) HUMERAL FRACTURE;  Surgeon: Beverely Low, MD;  Location: [facility];  Service: Orthopedics;  Laterality: Left;    SOCIAL HISTORY: Social History   Socioeconomic History   Marital status: Widowed    Spouse name: Not on file   Number of children: 1   Years of education: Not on file   Highest education level: Not on file  Occupational History   Occupation: Visual merchandiser Programs    Comment: Self-employed  Tobacco Use   Smoking status: Never   Smokeless tobacco: Never   Tobacco comments:    second hand smoke exposure  Vaping Use   Vaping status: Never Used  Substance and Sexual Activity   Alcohol use: No   Drug use: No   Sexual activity: Not Currently  Other Topics Concern   Not on file  Social History Narrative   Has a daughter named Suzanne [name]. Daughter has CP.   Social Drivers of Corporate investment banker Strain: Low Risk  (09/17/2022)   Overall Financial Resource Strain (CARDIA)    Difficulty of Paying Living Expenses: Not hard at all  Food Insecurity: No Food Insecurity (09/17/2022)   Hunger Vital Sign    Worried About Running Out of Food in the Last Year: Never true    Ran Out of Food in the Last Year: Never true  Transportation Needs: No Transportation Needs (09/17/2022)   PRAPARE - Administrator, Civil Service (Medical): No    Lack of Transportation (Non-Medical): No  Physical Activity: Inactive (03/07/2023)   Exercise Vital Sign    Days of Exercise per Week: 0 days    Minutes of Exercise per Session: 0 min  Stress: Stress Concern Present (03/07/2023)   [employer] of Occupational Health - Occupational Stress Questionnaire    Feeling of Stress : To some extent  Social Connections: Socially Isolated (03/07/2023)   Social Connection and Isolation Panel [NHANES]    Frequency of Communication with Friends and Family:  Once a week    Frequency  of Social Gatherings with Friends and Family: Once a week    Attends Religious Services: Never    Database administrator or Organizations: Yes    Attends Banker Meetings: Never    Marital Status: Widowed  Intimate Partner Violence: Not At Risk (09/17/2022)   Humiliation, Afraid, Rape, and Kick questionnaire    Fear of Current or Ex-Partner: No    Emotionally Abused: No    Physically Abused: No    Sexually Abused: No    FAMILY HISTORY: Family History  Problem Relation Age of Onset   Breast cancer Sister        in her 23's-70s   Heart disease Brother        CABG   Heart disease Brother        CABG   Skin cancer Brother        melanoma   Colon cancer Cousin    Heart attack Mother    Heart attack Father    Prostate cancer Brother     Review of Systems  Constitutional:  Negative for appetite change, chills, fatigue, fever and unexpected weight change.  HENT:   Negative for hearing loss, lump/mass and trouble swallowing.   Eyes:  Negative for eye problems and icterus.  Respiratory:  Negative for chest tightness, cough and shortness of breath.   Cardiovascular:  Negative for chest pain, leg swelling and palpitations.  Gastrointestinal:  Negative for abdominal distention, abdominal pain, constipation, diarrhea, nausea and vomiting.  Endocrine: Negative for hot flashes.  Genitourinary:  Negative for difficulty urinating.   Musculoskeletal:  Negative for arthralgias.  Skin:  Negative for itching and rash.  Neurological:  Negative for dizziness, extremity weakness, headaches and numbness.  Hematological:  Negative for adenopathy. Does not bruise/bleed easily.  Psychiatric/Behavioral:  Negative for depression. The patient is not nervous/anxious.       PHYSICAL EXAMINATION    Vitals:   08/22/23 1304  BP: 136/77  Pulse: (!) 107  Resp: 16  Temp: 98.1 F (36.7 C)  SpO2: 100%    She is in a wheel chair, Suzanne [name] is here with her. She  appears to be well, no acute distress  LABORATORY DATA:  CBC    Component Value Date/Time   WBC 6.7 08/22/2023 1246   WBC 5.1 10/16/2022 1301   RBC 3.76 (L) 08/22/2023 1246   HGB 8.9 (L) 08/22/2023 1246   HCT 31.2 (L) 08/22/2023 1246   PLT 294 08/22/2023 1246   MCV 83.0 08/22/2023 1246   MCH 23.7 (L) 08/22/2023 1246   MCHC 28.5 (L) 08/22/2023 1246   RDW 18.1 (H) 08/22/2023 1246   LYMPHSABS 1.0 08/22/2023 1246   MONOABS 0.4 08/22/2023 1246   EOSABS 0.3 08/22/2023 1246   BASOSABS 0.0 08/22/2023 1246    CMP     Component Value Date/Time   NA 141 07/24/2023 1400   K 4.0 07/24/2023 1400   CL 105 07/24/2023 1400   CO2 31 07/24/2023 1400   GLUCOSE 157 (H) 07/24/2023 1400   BUN 21 07/24/2023 1400   CREATININE 1.09 (H) 07/24/2023 1400   CALCIUM 9.3 07/24/2023 1400   PROT 6.6 07/24/2023 1400   ALBUMIN 3.7 07/24/2023 1400   AST 30 07/24/2023 1400   ALT 18 07/24/2023 1400   ALKPHOS 132 (H) 07/24/2023 1400   BILITOT 0.4 07/24/2023 1400   GFRNONAA 57 (L) 07/24/2023 1400   GFRAA >60 02/24/2020 1454   GFRAA >60 07/24/2019 1453      ASSESSMENT and THERAPY  PLAN:   Malignant neoplasm metastatic to bone [facility]) Metastatic Breast Cancer Currently on Truqap 200mg  BID and faslodex. Most recent imaging with progression with increased metastatic lesion size and rising tumor markers. Likely Current treatment ineffective. Recommended switch to Xeloda (capecitabine) for efficacy. - Discontinue current regimen once Xeloda is available. - Initiate Xeloda at 1000 mg/m2 standard dose, two weeks on, one week off. - Monitor for side effects: fatigue, nausea, vomiting, diarrhea, hand-foot syndrome, corononary vasospasm - Educated on rare serious side effects: chest pain,  fatal side effects in people with DYPD deficiency. - Continue Faslodex and Truqap until Xeloda is available, then discontinue. - Schedule follow-up every three weeks for progress and side effects. - Plan scan after three to  four Xeloda cycles to assess efficacy.     All questions were answered. The patient knows to call the clinic with any problems, questions or concerns. We can certainly see the patient much sooner if necessary.  Total encounter time:30 minutes*in face-to-face visit time, chart review, lab review, care coordination, order entry, and documentation of the encounter time.   *Total Encounter Time as defined by the Centers for Medicare and Medicaid Services includes, in addition to the face-to-face time of a patient visit (documented in the note above) non-face-to-face time: obtaining and reviewing outside history, ordering and reviewing medications, tests or procedures, care coordination (communications with other health care professionals or caregivers) and documentation in the medical record.

## 2023-08-23 LAB — CANCER ANTIGEN 27.29: CA 27.29: 837 U/mL — ABNORMAL HIGH (ref 0.0–38.6)

## 2023-08-26 ENCOUNTER — Other Ambulatory Visit (HOSPITAL_COMMUNITY): Payer: Self-pay

## 2023-08-26 ENCOUNTER — Other Ambulatory Visit: Payer: Self-pay | Admitting: Hematology and Oncology

## 2023-08-26 ENCOUNTER — Other Ambulatory Visit: Payer: Self-pay

## 2023-08-26 ENCOUNTER — Telehealth: Payer: Self-pay | Admitting: Pharmacy Technician

## 2023-08-26 ENCOUNTER — Other Ambulatory Visit: Payer: Self-pay | Admitting: Pharmacy Technician

## 2023-08-26 MED ORDER — CAPECITABINE 500 MG PO TABS: 1000.0000 mg/m2 | ORAL_TABLET | Freq: Two times a day (BID) | ORAL | 2 refills | Status: DC

## 2023-08-26 NOTE — Telephone Encounter (Signed)
 Oral Oncology Patient Advocate Encounter  After completing a benefits investigation, prior authorization for capecitabine is not required at this time through [facility]  Patient's copay is $4.     Omer Jack, CPhT-Adv Oncology Pharmacy Patient Advocate [facility]  Direct Number: [phone]  Fax: [phone]

## 2023-08-26 NOTE — Progress Notes (Signed)
 Patient States MD has taken patient off of Truqap.

## 2023-08-26 NOTE — Progress Notes (Signed)
 Pt appears to be progression, hence treatment was switched from truqap and faslodex to xeloda.

## 2023-08-27 ENCOUNTER — Telehealth: Payer: Self-pay | Admitting: Hematology and Oncology

## 2023-08-27 ENCOUNTER — Other Ambulatory Visit: Payer: Self-pay | Admitting: Pharmacist

## 2023-08-27 DIAGNOSIS — C7801 Secondary malignant neoplasm of right lung: Secondary | ICD-10-CM | POA: Diagnosis not present

## 2023-08-27 DIAGNOSIS — J9621 Acute and chronic respiratory failure with hypoxia: Secondary | ICD-10-CM | POA: Diagnosis not present

## 2023-08-27 DIAGNOSIS — R0602 Shortness of breath: Secondary | ICD-10-CM | POA: Diagnosis not present

## 2023-08-27 DIAGNOSIS — S22000A Wedge compression fracture of unspecified thoracic vertebra, initial encounter for closed fracture: Secondary | ICD-10-CM | POA: Diagnosis not present

## 2023-08-27 MED ORDER — CAPECITABINE 500 MG PO TABS
ORAL_TABLET | ORAL | 0 refills | Status: DC
  Filled 2023-08-28: qty 98, 14d supply, fill #0

## 2023-08-27 NOTE — Telephone Encounter (Signed)
 Spoke with patient confirming upcoming appointment

## 2023-08-27 NOTE — Progress Notes (Signed)
 Per Dr. Al Pimple, updated capecitabine prescription to reflect 2000 mg in AM, 1500 mg in PM 14/21 days.   Lakin Rhine A. Odetta Pink, PharmD, BCOP, CPP 08/27/2023 9:27 AM

## 2023-08-28 ENCOUNTER — Other Ambulatory Visit: Payer: Self-pay

## 2023-08-28 ENCOUNTER — Other Ambulatory Visit: Payer: Self-pay | Admitting: Pharmacy Technician

## 2023-08-28 NOTE — Progress Notes (Signed)
 Specialty Pharmacy Initial Fill Coordination Note  Suzanne [name] is a 63 y.o. female contacted today regarding refills of specialty medication(s) Capecitabine (XELODA) .  Patient requested Delivery  on 09/03/23  to verified address [address]   [place] [address]   Medication will be filled on 09/02/23.   Patient is aware of $4 copayment.

## 2023-08-29 ENCOUNTER — Other Ambulatory Visit (HOSPITAL_COMMUNITY): Payer: Self-pay

## 2023-08-29 NOTE — Progress Notes (Signed)
  [facility]cer Center       Telephone: [phone]?Fax: [phone]   Oncology Clinical Pharmacist Practitioner Initial Assessment  Suzanne [name] is a 63 y.o. female with a diagnosis of breast cancer. They were contacted today via in-person visit. She is accompanied by her daughter Suzanne [name].  Indication/Regimen Capecitabine (Xeloda) is being used appropriately for treatment of metastatic breast cancer by Dr. [name].      Wt Readings from Last 1 Encounters:  08/22/23 182 lb 12.8 oz (82.9 kg)    Estimated body surface area is 1.86 meters squared as calculated from the following:   Height as of 07/24/23: 4\' 11"  (1.499 m).   Weight as of 08/22/23: 182 lb 12.8 oz (82.9 kg).  The dosing regimen is 4 tablets (2000 mg) by mouth in the morning and 3 tablets (1500 mg) in the evening on days 1 to 14 of a 21-day cycle. . This is being given  monotherapy. It is planned to continue until disease progression or unacceptable toxicity. Prescription dose and frequency assessed for appropriateness.  Patient has agreed to treatment which is documented in physician note on 08/22/23. Counseled patient on administration, dosing, side effects, monitoring, drug-food interactions, safe handling, storage, and disposal.  Baseline labs done 08/22/23. She will start capecitabine on Thursday 09/05/23. She does follow with Dr. Rhoderick Moody for an ulcer and is on pantoprazole. She sees him yearly.   Dental clearance form given today to restart denosumab 120 mg every 12 weeks once cleared. Dr. Al Pimple wanted clearance since she has a chipped tooth. Last dose given 03/14/23.  Dose Modifications Dr. Al Pimple is prescribing capecitabine at a dose of ~941 mg/m2 BID 14 out of 21 days   Access Assessment Suzanne [name] will be receiving capecitabine through [facility] Concerns: none Start date if known: 09/04/23  Adherence Assessment Reviewed importance on keeping a med  schedule and plan for any missed doses Barriers to adherence identified? No  Allergies Allergies  Allergen Reactions   Ferumoxytol Other (See Comments)    Pt had hypersensitivity to Ferumoxytol. She had Facial Flushing. See note from 07/14/2021    Vitals    08/22/2023    1:04 PM 07/24/2023    2:24 PM 06/25/2023    1:22 PM  Oncology Vitals  Height  150 cm   Weight 82.918 kg 81.466 kg 80.105 kg  Weight (lbs) 182 lbs 13 oz 179 lbs 10 oz 176 lbs 10 oz  BMI 36.92 kg/m2 36.27 kg/m2 35.67 kg/m2  Temp 98.1 F (36.7 C) 98.4 F (36.9 C) 98.2 F (36.8 C)  Pulse Rate 107 108 74  BP 136/77 140/77 140/72  Resp 16 18 16   SpO2 100 % 99 % 100 %  BSA (m2) 1.86 m2 1.84 m2 1.83 m2     Laboratory Data    Latest Ref Rng & Units 08/22/2023   12:46 PM 07/24/2023    2:00 PM 06/25/2023   12:55 PM  CBC EXTENDED  WBC 4.0 - 10.5 K/uL 6.7  6.2  5.9   RBC 3.87 - 5.11 MIL/uL 3.76  3.57  3.69   Hemoglobin 12.0 - 15.0 g/dL 8.9  8.5  8.7   HCT 44.0 - 46.0 % 31.2  29.6  29.2   Platelets 150 - 400 K/uL 294  290  264   NEUT# 1.7 - 7.7 K/uL 4.9  4.1  3.6   Lymph# 0.7 - 4.0 K/uL 1.0  1.3  1.4  Latest Ref Rng & Units 08/22/2023   12:46 PM 07/24/2023    2:00 PM 06/25/2023   12:55 PM  CMP  Glucose 70 - 99 mg/dL 409  811  914   BUN 8 - 23 mg/dL 21  21  14    Creatinine 0.44 - 1.00 mg/dL 7.82  9.56  2.13   Sodium 135 - 145 mmol/L 141  141  139   Potassium 3.5 - 5.1 mmol/L 3.8  4.0  3.9   Chloride 98 - 111 mmol/L 105  105  103   CO2 22 - 32 mmol/L 31  31  29    Calcium 8.9 - 10.3 mg/dL 9.4  9.3  9.2   Total Protein 6.5 - 8.1 g/dL 6.9  6.6  6.6   Total Bilirubin 0.0 - 1.2 mg/dL 0.4  0.4  0.5   Alkaline Phos 38 - 126 U/L 140  132  123   AST 15 - 41 U/L 28  30  36   ALT 0 - 44 U/L 20  18  20     Lab Results  Component Value Date   CA2729 837.0 (H) 08/22/2023   CA2729 628.3 (H) 07/24/2023   CA2729 515.6 (H) 06/25/2023     Contraindications Contraindications were reviewed? Yes Contraindications to  therapy were identified? No   Safety Precautions The following safety precautions for the use of capecitabine were reviewed:  Fever: reviewed the importance of having a thermometer and the Centers for Disease Control and Prevention (CDC) definition of fever which is 100.54F (38C) or higher. Patient should call 24/7 triage at [phone] if experiencing a fever or any other symptoms Decreased white blood cells (WBCs) and increased risk for infection Decreased hemoglobin, part of the red blood cells that carry iron and oxygen Diarrhea Mouth irritation or sores Pain or discomfort in hands and/or feet Changes in liver function Nausea or vomiting Fatigue Abdominal pain Decreased platelet count and increased risk of bleeding Interactions with warfarin Cardiotoxicity Kidney injury Dehydration Alopecia Handling body fluids and waste Pregnancy, sexual activity, and contraception Storage and Handling To be taken within 30 minutes of a meal Missed doses  Medication Reconciliation Current Outpatient Medications  Medication Sig Dispense Refill   clobetasol (TEMOVATE) 0.05 % external solution Apply 1 Application topically 2 (two) times daily. 50 mL 2   Cyanocobalamin (B-12) 1000 MCG TABS Take 1 tablet (1,000 mcg total) by mouth daily. 90 tablet 1   furosemide (LASIX) 20 MG tablet Take 1 tablet (20 mg total) by mouth daily. 90 tablet 4   OYSTER SHELL CALCIUM PLUS D 500-5 MG-MCG TABS TAKE ONE TABLET BY MOUTH EVERY MORNING and TAKE ONE TABLET BY MOUTH AT NOON and TAKE ONE TABLET BY MOUTH EVERY EVENING (Patient taking differently: Take 1 tablet by mouth 2 (two) times daily.) 90 tablet 1   pantoprazole (PROTONIX) 40 MG tablet Take 1 tablet (40 mg total) by mouth daily. 90 tablet 3   potassium chloride (MICRO-K) 10 MEQ CR capsule Take 1 capsule (10 mEq total) by mouth daily. 90 capsule 4   tacrolimus (PROTOPIC) 0.1 % ointment Apply topically 2 (two) times daily. Apply to affected areas of  psoriasis on the days not using triamcinolone. 100 g 0   traMADol (ULTRAM) 50 MG tablet Take 1 tablet (50 mg total) by mouth every 6 (six) hours as needed for moderate pain (pain score 4-6) or severe pain (pain score 7-10). 30 tablet 0   triamcinolone ointment (KENALOG) 0.1 % Apply 1 Application topically 2 (  two) times daily to affected areas of psoriasis for 2 weeks, then take 2 week break before restarting. 453 g 3   acetaminophen (TYLENOL) 500 MG tablet Take 1,000 mg by mouth every 6 (six) hours as needed for mild pain. (Patient not taking: Reported on 09/03/2023)     Apremilast (OTEZLA) 30 MG TABS Take 1 tablet (30 mg total) by mouth 2 (two) times daily. (Patient not taking: Reported on 09/03/2023) 60 tablet 11   capecitabine (XELODA) 500 MG tablet Take 4 tabs (2000 mg) in AM and 3 tabs (1500 mg) in PM by mouth every 12 hours for 14 days, followed by a 7 day rest period (21 day supply). Take within 30 minutes of a meal. (Patient not taking: Reported on 09/03/2023) 98 tablet 0   Denosumab (XGEVA [place]) Inject 1 Dose into the skin every 30 (thirty) days. (Patient not taking: Reported on 09/03/2023)     hydrOXYzine (ATARAX) 10 MG tablet Take 1-2 tablets (10-20 mg total) by mouth 3 (three) times daily as needed. (Patient not taking: Reported on 09/03/2023) 90 tablet 3   ipratropium-albuterol (DUONEB) 0.5-2.5 (3) MG/3ML SOLN Take 3 mLs by nebulization every 6 (six) hours as needed. (Patient not taking: Reported on 09/03/2023) 360 mL 0   loperamide (IMODIUM) 2 MG capsule Take 2 mg by mouth daily as needed for diarrhea or loose stools. (Patient not taking: Reported on 09/03/2023)     ondansetron (ZOFRAN-ODT) 8 MG disintegrating tablet Dissolve 1 tablet (8 mg total) by mouth every 8 (eight) hours as needed for nausea. (Patient not taking: Reported on 09/03/2023) 20 tablet 3   No current facility-administered medications for this visit.   Medication reconciliation is based on the patient's most recent medication list in the  electronic medical record (EMR) including herbal products and OTC medications.   The patient's medication list was reviewed today with the patient? Yes   Drug-drug interactions (DDIs) DDIs were evaluated? Yes Significant DDIs identified? No   Drug-Food Interactions Drug-food interactions were evaluated? Yes Drug-food interactions identified? No   Follow-up Plan  Patient education handout given to patient Start capecitabine 2000 mg in AM, 1500 mg in PM by mouth every 12 hours for 14 days, followed by a 7 day rest period (21 day cycle). Take within 30 minutes of a meal. Start 09/05/23. Delivery 09/03/23 Move lab, Dr. Al Pimple visit from 09/18/23 to 09/25/23 since should have labs every 3 weeks and labs prior to starting next cycle. Next cycle starts 09/26/23 Baseline labs were done on 08/22/23 Monitor for toxicities. Patient takes several medications for psoriasis.  Suzanne [name] can follow up with clinical pharmacy as deemed necessary by Dr. Burnice Logan [name] going forward   Suzanne [name] participated in the discussion, expressed understanding, and voiced agreement with the above plan. All questions were answered to her satisfaction. The patient was advised to contact the clinic at [phone] with any questions or concerns prior to her return visit.   I spent 60 minutes assessing the patient.  Suzanne [name] A. Odetta Pink, PharmD, BCOP, CPP  Suzanne [name], RPH-CPP, 09/03/2023 4:23 PM  **Disclaimer: This note was dictated with voice recognition software. Similar sounding words can inadvertently be transcribed and this note may contain transcription errors which may not have been corrected upon publication of note.**

## 2023-09-02 ENCOUNTER — Telehealth: Payer: Self-pay | Admitting: *Deleted

## 2023-09-02 ENCOUNTER — Other Ambulatory Visit: Payer: Self-pay

## 2023-09-02 NOTE — Telephone Encounter (Signed)
   Patient's daughter called to ask about skin rash recurrence. She would like to get a refill on the medication prescribed last time. She could not recall the medication name but is certain Dr. Al Pimple prescribed it.    The redness has not started yet and she does not have the fever. She has pain and is fatigued. This is the same symptoms she experienced  last time before the rash began. She comes in tomorrow to see pharmacy about the new prescription and receive education. She is wondering if they could see someone then if it will be required or can this medication be called in.

## 2023-09-03 ENCOUNTER — Other Ambulatory Visit: Payer: Self-pay

## 2023-09-03 ENCOUNTER — Inpatient Hospital Stay: Attending: Adult Health | Admitting: Pharmacist

## 2023-09-03 DIAGNOSIS — C7951 Secondary malignant neoplasm of bone: Secondary | ICD-10-CM | POA: Insufficient documentation

## 2023-09-03 DIAGNOSIS — C779 Secondary and unspecified malignant neoplasm of lymph node, unspecified: Secondary | ICD-10-CM | POA: Insufficient documentation

## 2023-09-03 DIAGNOSIS — Z800 Family history of malignant neoplasm of digestive organs: Secondary | ICD-10-CM | POA: Insufficient documentation

## 2023-09-03 DIAGNOSIS — C50811 Malignant neoplasm of overlapping sites of right female breast: Secondary | ICD-10-CM | POA: Diagnosis present

## 2023-09-03 DIAGNOSIS — L819 Disorder of pigmentation, unspecified: Secondary | ICD-10-CM | POA: Insufficient documentation

## 2023-09-03 DIAGNOSIS — L409 Psoriasis, unspecified: Secondary | ICD-10-CM | POA: Diagnosis not present

## 2023-09-03 DIAGNOSIS — D509 Iron deficiency anemia, unspecified: Secondary | ICD-10-CM | POA: Insufficient documentation

## 2023-09-03 DIAGNOSIS — Z170 Estrogen receptor positive status [ER+]: Secondary | ICD-10-CM | POA: Insufficient documentation

## 2023-09-03 DIAGNOSIS — L270 Generalized skin eruption due to drugs and medicaments taken internally: Secondary | ICD-10-CM | POA: Insufficient documentation

## 2023-09-03 DIAGNOSIS — R110 Nausea: Secondary | ICD-10-CM | POA: Diagnosis not present

## 2023-09-03 DIAGNOSIS — E860 Dehydration: Secondary | ICD-10-CM | POA: Insufficient documentation

## 2023-09-03 DIAGNOSIS — C7800 Secondary malignant neoplasm of unspecified lung: Secondary | ICD-10-CM | POA: Insufficient documentation

## 2023-09-03 DIAGNOSIS — Z803 Family history of malignant neoplasm of breast: Secondary | ICD-10-CM | POA: Diagnosis not present

## 2023-09-03 DIAGNOSIS — R5383 Other fatigue: Secondary | ICD-10-CM | POA: Insufficient documentation

## 2023-09-03 DIAGNOSIS — R000 Tachycardia, unspecified: Secondary | ICD-10-CM | POA: Insufficient documentation

## 2023-09-03 DIAGNOSIS — Z808 Family history of malignant neoplasm of other organs or systems: Secondary | ICD-10-CM | POA: Insufficient documentation

## 2023-09-03 NOTE — Progress Notes (Signed)
 Patient counseled in clinic in-person visit note on 09/03/23

## 2023-09-04 ENCOUNTER — Encounter: Payer: Self-pay | Admitting: Nurse Practitioner

## 2023-09-04 ENCOUNTER — Ambulatory Visit: Payer: Medicaid Other | Attending: Nurse Practitioner | Admitting: Nurse Practitioner

## 2023-09-04 VITALS — BP 133/84 | HR 100 | Resp 19 | Ht 59.0 in | Wt 182.0 lb

## 2023-09-04 DIAGNOSIS — N644 Mastodynia: Secondary | ICD-10-CM

## 2023-09-04 NOTE — Progress Notes (Signed)
 I have seen and examined this patient with the advanced practice provider STUDENT and agree with the note below

## 2023-09-04 NOTE — Telephone Encounter (Signed)
 Per return call to pt for further information - rash has not recurred- area overall has not progressed to the redness as previously which result also in pt developing a fever.  Suzanne [name] feels area is not an issue at this time.  Of note she is seeing her primary MD today and will have him evaluate her breast for any concerns as well.  No further needs at this time.

## 2023-09-04 NOTE — Progress Notes (Signed)
 Assessment & Plan:  Mindi was seen today for medical management of chronic issues.  Diagnoses and all orders for this visit:  Pain of right breast Follow with Oncologist, Dr [name] at [facility] for breast pain.  Tylenol 500 mg tablets take 1,000 mg by mouth every 6 (six) hours as needed for mild pain (pain score 1-3).    Patient has been counseled on age-appropriate routine health concerns for screening and prevention. These are reviewed and up-to-date. Referrals have been placed accordingly. Immunizations are up-to-date or declined.    Subjective:   Chief Complaint  Patient presents with   Medical Management of Chronic Issues    AUDIA AMICK 63 y.o. female presents to office today for follow up on right breat pain. States she is having 3/10 pain in right breast. States pain in relieved with Tylenol. Reports starting new cancer treatment for Metastatic Breast Cancer on 09/05/2023. Followed by Oncologist, Dr [name] at [facility]. Physical exam of breast does not reveal any signs of skin infection or cellulitis.     Patient requested pneumonia vaccine. Explained she will need to discuss receiving vaccinations with Oncologist due to weakened immune system and cancer treatments.       Review of Systems  Constitutional:  Negative for fever.  HENT: Negative.    Eyes: Negative.   Respiratory: Negative.    Cardiovascular: Negative.   Gastrointestinal: Negative.   Genitourinary: Negative.        Right breast pain  Musculoskeletal: Negative.   Skin:  Positive for rash.  Neurological: Negative.   Endo/Heme/Allergies: Negative.   Psychiatric/Behavioral: Negative.      Past Medical History:  Diagnosis Date   Anemia    Cancer (HCC)    Family history of breast cancer    Family history of colon cancer    Family history of melanoma    Family history of prostate cancer    Gallstones    History of hiatal hernia    History of  kidney stones    Mastitis    reports history of recurrent mastitis   Metastatic breast cancer    Obesity    Psoriasis     Past Surgical History:  Procedure Laterality Date   CYSTOSCOPY/URETEROSCOPY/HOLMIUM LASER/STENT PLACEMENT Left 10/17/2022   Procedure: CYSTOSCOPY, LEFT RETROGRADE PYELOGRAM, LEFT URETEROSCOPY, HOLMIUM LASER LITHOTRIPSY, BASKET EXTRACTION, AND LEFT URETERAL STENT PLACEMENT;  Surgeon: Rene Paci, MD;  Location: [facility];  Service: Urology;  Laterality: Left;  45 MINUTES   ESOPHAGOGASTRODUODENOSCOPY (EGD) WITH PROPOFOL N/A 05/27/2022   Procedure: ESOPHAGOGASTRODUODENOSCOPY (EGD) WITH PROPOFOL;  Surgeon: Willis Modena, MD;  Location: [facility];  Service: Gastroenterology;  Laterality: N/A;   EYE SURGERY     cataract   GLAUCOMA SURGERY  late 1990's   IR THORACENTESIS ASP PLEURAL SPACE W/IMG GUIDE  10/28/2018   IR THORACENTESIS ASP PLEURAL SPACE W/IMG GUIDE  10/29/2018   OPEN REDUCTION INTERNAL FIXATION (ORIF) DISTAL RADIAL FRACTURE Left 01/09/2019   Procedure: OPEN REDUCTION INTERNAL FIXATION (ORIF) HUMERAL FRACTURE;  Surgeon: Beverely Low, MD;  Location: [facility];  Service: Orthopedics;  Laterality: Left;    Family History  Problem Relation Age of Onset   Breast cancer Sister        in her 67's-70s   Heart disease Brother        CABG   Heart disease Brother        CABG   Skin cancer Brother  melanoma   Colon cancer Cousin    Heart attack Mother    Heart attack Father    Prostate cancer Brother     Social History Reviewed with no changes to be made today.   Outpatient Medications Prior to Visit  Medication Sig Dispense Refill   acetaminophen (TYLENOL) 500 MG tablet Take 1,000 mg by mouth every 6 (six) hours as needed for mild pain (pain score 1-3).     Apremilast (OTEZLA) 30 MG TABS Take 1 tablet (30 mg total) by mouth 2 (two) times daily. 60 tablet 11   capecitabine (XELODA) 500 MG tablet Take 4 tabs (2000 mg) in AM and 3 tabs (1500  mg) in PM by mouth every 12 hours for 14 days, followed by a 7 day rest period (21 day supply). Take within 30 minutes of a meal. 98 tablet 0   clobetasol (TEMOVATE) 0.05 % external solution Apply 1 Application topically 2 (two) times daily. 50 mL 2   Cyanocobalamin (B-12) 1000 MCG TABS Take 1 tablet (1,000 mcg total) by mouth daily. 90 tablet 1   Denosumab (XGEVA [place]) Inject 1 Dose into the skin every 30 (thirty) days.     furosemide (LASIX) 20 MG tablet Take 1 tablet (20 mg total) by mouth daily. 90 tablet 4   hydrOXYzine (ATARAX) 10 MG tablet Take 1-2 tablets (10-20 mg total) by mouth 3 (three) times daily as needed. 90 tablet 3   ipratropium-albuterol (DUONEB) 0.5-2.5 (3) MG/3ML SOLN Take 3 mLs by nebulization every 6 (six) hours as needed. 360 mL 0   loperamide (IMODIUM) 2 MG capsule Take 2 mg by mouth daily as needed for diarrhea or loose stools.     ondansetron (ZOFRAN-ODT) 8 MG disintegrating tablet Dissolve 1 tablet (8 mg total) by mouth every 8 (eight) hours as needed for nausea. 20 tablet 3   OYSTER SHELL CALCIUM PLUS D 500-5 MG-MCG TABS TAKE ONE TABLET BY MOUTH EVERY MORNING and TAKE ONE TABLET BY MOUTH AT NOON and TAKE ONE TABLET BY MOUTH EVERY EVENING (Patient taking differently: Take 1 tablet by mouth 2 (two) times daily.) 90 tablet 1   pantoprazole (PROTONIX) 40 MG tablet Take 1 tablet (40 mg total) by mouth daily. 90 tablet 3   potassium chloride (MICRO-K) 10 MEQ CR capsule Take 1 capsule (10 mEq total) by mouth daily. 90 capsule 4   tacrolimus (PROTOPIC) 0.1 % ointment Apply topically 2 (two) times daily. Apply to affected areas of psoriasis on the days not using triamcinolone. 100 g 0   traMADol (ULTRAM) 50 MG tablet Take 1 tablet (50 mg total) by mouth every 6 (six) hours as needed for moderate pain (pain score 4-6) or severe pain (pain score 7-10). 30 tablet 0   triamcinolone ointment (KENALOG) 0.1 % Apply 1 Application topically 2 (two) times daily to affected areas of psoriasis  for 2 weeks, then take 2 week break before restarting. 453 g 3   No facility-administered medications prior to visit.    Allergies  Allergen Reactions   Ferumoxytol Other (See Comments)    Pt had hypersensitivity to Ferumoxytol. She had Facial Flushing. See note from 07/14/2021       Objective:    BP 133/84 (BP Location: Left Arm, Patient Position: Sitting, Cuff Size: Normal)   Pulse 100   Resp 19   Ht 4\' 11"  (1.499 m)   Wt 182 lb (82.6 kg)   LMP 05/28/2013 (Within Months)   SpO2 98%   BMI 36.76 kg/m  Wt Readings from Last 3 Encounters:  09/04/23 182 lb (82.6 kg)  08/22/23 182 lb 12.8 oz (82.9 kg)  07/24/23 179 lb 9.6 oz (81.5 kg)    Physical Exam Vitals and nursing note reviewed.  Constitutional:      Appearance: Normal appearance. She is obese.  HENT:     Head: Normocephalic.  Cardiovascular:     Rate and Rhythm: Normal rate and regular rhythm.     Heart sounds: Normal heart sounds.  Pulmonary:     Effort: Pulmonary effort is normal.     Breath sounds: Normal breath sounds.     Comments: Wears oxygen Chest:     Chest wall: Tenderness present.  Breasts:    Right: No swelling, bleeding, inverted nipple, mass, nipple discharge, skin change or tenderness.     Left: No swelling, bleeding, inverted nipple, mass, nipple discharge, skin change or tenderness.    Musculoskeletal:     Cervical back: Normal range of motion.     Comments: patient in wheelchair  Skin:    General: Skin is warm and dry.     Findings: Rash present.  Neurological:     General: No focal deficit present.     Mental Status: She is alert and oriented to person, place, and time.  Psychiatric:        Attention and Perception: Attention and perception normal.        Mood and Affect: Mood and affect normal.        Speech: Speech normal.        Behavior: Behavior normal.        Thought Content: Thought content normal.        Cognition and Memory: Cognition and memory normal.        Judgment:  Judgment normal.          Patient has been counseled extensively about nutrition and exercise as well as the importance of adherence with medications and regular follow-up. The patient was given clear instructions to go to ER or return to medical center if symptoms don't improve, worsen or new problems develop. The patient verbalized understanding.   Follow-up: Return in about 6 months (around 03/05/2024).   Joette Catching, BSN RN, Student FNP [facility] [place], [place] [phone]   09/04/2023, 5:50 PM

## 2023-09-09 ENCOUNTER — Other Ambulatory Visit (HOSPITAL_COMMUNITY): Payer: Self-pay

## 2023-09-09 ENCOUNTER — Telehealth: Payer: Self-pay

## 2023-09-09 ENCOUNTER — Other Ambulatory Visit: Payer: Self-pay

## 2023-09-09 DIAGNOSIS — C50811 Malignant neoplasm of overlapping sites of right female breast: Secondary | ICD-10-CM

## 2023-09-09 MED ORDER — ONDANSETRON 8 MG PO TBDP
8.0000 mg | ORAL_TABLET | Freq: Three times a day (TID) | ORAL | 3 refills | Status: DC | PRN
  Filled 2023-09-09: qty 20, 7d supply, fill #0
  Filled 2023-10-07: qty 20, 7d supply, fill #1
  Filled 2023-11-05: qty 20, 7d supply, fill #2
  Filled 2024-01-23: qty 20, 7d supply, fill #3

## 2023-09-09 NOTE — Telephone Encounter (Signed)
 Called patient regarding after-hours triage call received about prolonged nose bleed. Patient reports that nose bleed has resolved and resulted from patient accidentally bumping her nasal cannula into her nostrils.  Patient was concerned that it took an extended length of time to control the bleeding.  Dr. Arno Bibles aware of the situation and does not feel that any medications need to be adjusted at this time, as patient felt that this may be related to Xeloda. Patient aware that she has upcoming appointment where labs will be redrawn to assess that she is not thrombocytopenic.  Patient also noted other s/s from Xeloda including intermittent nausea after eating. Patient encouraged to take zofran 30-minutes prior to meals to help prevent nausea. Patient agreeable to the recommendation and requests additional refill to be sent to [facility].  Refill sent. All additional questions and concerns discussed during call.

## 2023-09-10 ENCOUNTER — Telehealth: Payer: Self-pay | Admitting: *Deleted

## 2023-09-10 NOTE — Telephone Encounter (Signed)
 Pt called stating that she has some s/e still for the Xeloda that includes headaches,, fatigue, and nausea. Also, patient stated that she has some concerns for the discoloration underneath her breast that has turned from red to purple as well as in the creases of her thigh/leg. There is no itching, no pain and not warm to touch. Pt has been scheduled with [facility] and made aware of appt and verbalized understanding. Appt 4/17 @ 150pm

## 2023-09-12 ENCOUNTER — Inpatient Hospital Stay

## 2023-09-12 ENCOUNTER — Other Ambulatory Visit: Payer: Self-pay

## 2023-09-12 ENCOUNTER — Inpatient Hospital Stay (HOSPITAL_BASED_OUTPATIENT_CLINIC_OR_DEPARTMENT_OTHER): Admitting: Adult Health

## 2023-09-12 VITALS — BP 141/83 | HR 134 | Temp 98.0°F | Resp 17 | Ht 59.0 in | Wt 179.6 lb

## 2023-09-12 DIAGNOSIS — C7951 Secondary malignant neoplasm of bone: Secondary | ICD-10-CM

## 2023-09-12 DIAGNOSIS — C50911 Malignant neoplasm of unspecified site of right female breast: Secondary | ICD-10-CM

## 2023-09-12 DIAGNOSIS — Z170 Estrogen receptor positive status [ER+]: Secondary | ICD-10-CM | POA: Diagnosis not present

## 2023-09-12 DIAGNOSIS — C50811 Malignant neoplasm of overlapping sites of right female breast: Secondary | ICD-10-CM | POA: Diagnosis not present

## 2023-09-12 LAB — CMP (CANCER CENTER ONLY)
ALT: 34 U/L (ref 0–44)
AST: 46 U/L — ABNORMAL HIGH (ref 15–41)
Albumin: 3.8 g/dL (ref 3.5–5.0)
Alkaline Phosphatase: 175 U/L — ABNORMAL HIGH (ref 38–126)
Anion gap: 5 (ref 5–15)
BUN: 20 mg/dL (ref 8–23)
CO2: 35 mmol/L — ABNORMAL HIGH (ref 22–32)
Calcium: 10.3 mg/dL (ref 8.9–10.3)
Chloride: 102 mmol/L (ref 98–111)
Creatinine: 1.14 mg/dL — ABNORMAL HIGH (ref 0.44–1.00)
GFR, Estimated: 54 mL/min — ABNORMAL LOW (ref >60)
Glucose, Bld: 126 mg/dL — ABNORMAL HIGH (ref 70–99)
Potassium: 3.5 mmol/L (ref 3.5–5.1)
Sodium: 142 mmol/L (ref 135–145)
Total Bilirubin: 0.7 mg/dL (ref 0.0–1.2)
Total Protein: 7 g/dL (ref 6.5–8.1)

## 2023-09-12 LAB — CBC WITH DIFFERENTIAL (CANCER CENTER ONLY)
Abs Immature Granulocytes: 0.08 10*3/uL — ABNORMAL HIGH (ref 0.00–0.07)
Basophils Absolute: 0 10*3/uL (ref 0.0–0.1)
Basophils Relative: 1 %
Eosinophils Absolute: 0.2 10*3/uL (ref 0.0–0.5)
Eosinophils Relative: 3 %
HCT: 30.3 % — ABNORMAL LOW (ref 36.0–46.0)
Hemoglobin: 9.2 g/dL — ABNORMAL LOW (ref 12.0–15.0)
Immature Granulocytes: 1 %
Lymphocytes Relative: 11 %
Lymphs Abs: 0.7 10*3/uL (ref 0.7–4.0)
MCH: 24.1 pg — ABNORMAL LOW (ref 26.0–34.0)
MCHC: 30.4 g/dL (ref 30.0–36.0)
MCV: 79.5 fL — ABNORMAL LOW (ref 80.0–100.0)
Monocytes Absolute: 0.2 10*3/uL (ref 0.1–1.0)
Monocytes Relative: 4 %
Neutro Abs: 5.3 10*3/uL (ref 1.7–7.7)
Neutrophils Relative %: 80 %
Platelet Count: 271 10*3/uL (ref 150–400)
RBC: 3.81 MIL/uL — ABNORMAL LOW (ref 3.87–5.11)
RDW: 17.7 % — ABNORMAL HIGH (ref 11.5–15.5)
WBC Count: 6.5 10*3/uL (ref 4.0–10.5)
nRBC: 0 % (ref 0.0–0.2)

## 2023-09-12 LAB — SAMPLE TO BLOOD BANK

## 2023-09-12 MED ORDER — SODIUM CHLORIDE 0.9 % IV SOLN
INTRAVENOUS | Status: AC
Start: 2023-09-12 — End: 2023-09-12

## 2023-09-12 NOTE — Assessment & Plan Note (Addendum)
 Suzanne [name] is a 63 year old woman with metastatic breast cancer here today for f/u on treatment with capecitabine that began last week.  She has changing rash and color.  She also has not been tolerating her current dose of capecitabine.  I recommended that she hold treatment for 1 week.  We will obtain labs with a CBC and c-Met.  We will give her 1 L of fluids today or tomorrow depending on the availability of the infusion room.  I have sent a lab for type and cross in case she needs to receive blood if her anemia has worsened from prior.  Since her rash has not worsened it is only changed in color I will hold on giving her any steroids at this time.  If her symptoms worsen she knows to call us  back.  We will see her back in 1 week for labs and follow-up to discuss restarting Xeloda and a potential dose adjustment.

## 2023-09-12 NOTE — Progress Notes (Signed)
 [facility] Cancer Follow up:    [name], NP [address] [place] [place] [address]   DIAGNOSIS:  Cancer Staging  Carcinoma of breast, estrogen receptor positive, stage 4, right (HCC) Staging form: Breast, AJCC 8th Edition - Clinical: Stage IV (pM1) - Signed by Loa Socks, NP on 02/15/2023    SUMMARY OF ONCOLOGIC HISTORY: [place], [place] woman presenting 10/26/2018 with right-sided inflammatory breast cancer, stage IV, involving lungs, lymph nodes and bones, as follows:             (a) chest CT scan 10/26/2018 shows bilateral pleural effusions, possible lymphangitic spread of tumor, diffuse bony metastatic disease, and significant axillary mediastinal and hilar adenopathy             (b) bone scan 10/27/2018 is a "near SuperScan" consistent with widespread bony metastatic disease             (c) head CT with and without contrast 10/30/2018 shows no intracranial metastatic disease, multiple calvarial lesions             (d) CA-27-29 on 10/27/2018 was 1810.4   (1) pleural fluid from right thoracentesis 10/28/2018 confirms malignant cells consistent with a breast primary, strongly estrogen and progesterone receptor positive, HER-2 not amplified, with an MIB-1 of 2%   (2) anastrozole started 10/29/2018             (a) palbociclib started 11/13/2018 at 100 mg dose             (b) denosumab/xgeva started 11/13/2018              (C) palbociclib dose decreased to 75mg              (D) Palbociclib dose decreased to 75 mg every other day in 11/2021   (3) associated problems:             (a) hypoxia secondary to effusions             (b) pain from bone lesions and left humeral and vertebral compression fractures             (c) right upper extremity lymphedema             (d) poor venous access   (4) genetics testing on 12/25/2018 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 STK11, TP53, TSC1, TSC2, and VHL.  The following genes were evaluated for sequence changes only: SDHA and HOXB13 c.251G>A variant only. The [facility] Melanoma Panel analyzed the following 9 genes: BAP1 BRCA2 CDK4 CDKN2A MITF POT1 PTEN RB1 Tp53.    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 female returns for follow-up and evaluation after being on Xeloda for approximately a week.  She has developed increasing fatigue, dyspnea on exertion, nausea, decreased oral intake, and discoloration of her normal psoriasis that has become concerning.  She denies any fevers or chills.  She has not had any mouth  ulcerations.  She has no new areas of rash noted.  Of note her daughter said that around this time every year she does get psoriasis exacerbation however of late it has been more controlled and the rash has changed in color since beginning the medication last week.  She denies anything on her hands and feet.  And she has not had any bone pain.   Patient Active Problem List   Diagnosis Date Noted   UGI bleed 05/27/2022   Acute prerenal azotemia 04/18/2022   Dehydration 04/18/2022   SIRS (systemic inflammatory response syndrome) (HCC) 04/18/2022   Allergic rhinitis 04/18/2022   Depression 04/18/2022   Anemia of chronic disease 04/18/2022   Bronchitis 04/18/2022   Acute bronchitis 04/17/2022   Intertrigo 01/10/2022   Bacterial vaginitis 01/10/2022   Sepsis due to cellulitis (HCC) 12/06/2021   B12 deficiency anemia 08/15/2020   Sensorineural hearing loss (SNHL) of both ears 08/07/2019   Intractable back pain 02/03/2019   Thoracic compression fracture, closed, initial encounter (HCC) 02/03/2019   Essential hypertension 02/03/2019   Genetic testing 01/27/2019   Acute pharyngitis    Bacteremia    FTT (failure to thrive) in adult    Fracture closed, humerus, shaft 01/09/2019   Pneumonia 12/25/2018   Acute respiratory disorder in immunocompromised patient (HCC) 12/25/2018   Family history of breast cancer    Family history of prostate cancer    Family history of melanoma    Family history of colon cancer    Pressure injury of skin 11/24/2018   Chronic respiratory failure with hypoxia and hypercapnia (HCC) 11/19/2018   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   Hypophosphatemia 11/19/2018   Hypocalcemia 11/19/2018   Aspiration pneumonia (HCC) 11/19/2018   SOB (shortness of breath)    Palliative care by specialist    Carcinoma of breast, estrogen receptor positive, stage 4, right (HCC) 11/16/2018   Hypoalbuminemia 11/16/2018   Pleural effusion 11/14/2018   Goals of care,  counseling/discussion 11/06/2018   Malignant neoplasm of overlapping sites of right breast in female, estrogen receptor positive (HCC) 10/31/2018   Malignant neoplasm metastatic to lung (HCC) 10/31/2018   Pain from bone metastases (HCC) 10/31/2018   Class 2 obesity 10/31/2018   Bone injury    Pleural effusion, bilateral    Shortness of breath    Abnormal breast finding    Hypokalemia    Breast skin changes    Macrocytic anemia    Malignant neoplasm metastatic to bone (HCC)    Anemia, B12 deficiency 10/26/2018    is allergic  to ferumoxytol.  MEDICAL HISTORY: Past Medical History:  Diagnosis Date   Anemia    Cancer (HCC)    Family history of breast cancer    Family history of colon cancer    Family history of melanoma    Family history of prostate cancer    Gallstones    History of hiatal hernia    History of kidney stones    Mastitis    reports history of recurrent mastitis   Metastatic breast cancer    Obesity    Psoriasis     SURGICAL HISTORY: Past Surgical History:  Procedure Laterality Date   CYSTOSCOPY/URETEROSCOPY/HOLMIUM LASER/STENT PLACEMENT Left 10/17/2022   Procedure: CYSTOSCOPY, LEFT RETROGRADE PYELOGRAM, LEFT URETEROSCOPY, HOLMIUM LASER LITHOTRIPSY, BASKET EXTRACTION, AND LEFT URETERAL STENT PLACEMENT;  Surgeon: Rene Paci, MD;  Location: [facility];  Service: Urology;  Laterality: Left;  45 MINUTES   ESOPHAGOGASTRODUODENOSCOPY (EGD) WITH PROPOFOL N/A 05/27/2022   Procedure: ESOPHAGOGASTRODUODENOSCOPY (EGD) WITH PROPOFOL;  Surgeon: Willis Modena, MD;  Location: [facility];  Service: Gastroenterology;  Laterality: N/A;   EYE SURGERY     cataract   GLAUCOMA SURGERY  late 1990's   IR THORACENTESIS ASP PLEURAL SPACE W/IMG GUIDE  10/28/2018   IR THORACENTESIS ASP PLEURAL SPACE W/IMG GUIDE  10/29/2018   OPEN REDUCTION INTERNAL FIXATION (ORIF) DISTAL RADIAL FRACTURE Left 01/09/2019   Procedure: OPEN REDUCTION INTERNAL FIXATION (ORIF) HUMERAL  FRACTURE;  Surgeon: Beverely Low, MD;  Location: [facility];  Service: Orthopedics;  Laterality: Left;    SOCIAL HISTORY: Social History   Socioeconomic History   Marital status: Widowed    Spouse name: Not on file   Number of children: 1   Years of education: Not on file   Highest education level: Not on file  Occupational History   Occupation: Visual merchandiser Programs    Comment: Self-employed  Tobacco Use   Smoking status: Never   Smokeless tobacco: Never   Tobacco comments:    second hand smoke exposure  Vaping Use   Vaping status: Never Used  Substance and Sexual Activity   Alcohol use: No   Drug use: No   Sexual activity: Not Currently  Other Topics Concern   Not on file  Social History Narrative   Has a daughter named [name]. Daughter has CP.   Social Drivers of Corporate investment banker Strain: Low Risk  (09/17/2022)   Overall Financial Resource Strain (CARDIA)    Difficulty of Paying Living Expenses: Not hard at all  Food Insecurity: No Food Insecurity (09/17/2022)   Hunger Vital Sign    Worried About Running Out of Food in the Last Year: Never true    Ran Out of Food in the Last Year: Never true  Transportation Needs: No Transportation Needs (09/17/2022)   PRAPARE - Administrator, Civil Service (Medical): No    Lack of Transportation (Non-Medical): No  Physical Activity: Inactive (03/07/2023)   Exercise Vital Sign    Days of Exercise per Week: 0 days    Minutes of Exercise per Session: 0 min  Stress: Stress Concern Present (03/07/2023)   [employer] of Occupational Health - Occupational Stress Questionnaire    Feeling of Stress : To some extent  Social Connections: Socially Isolated (03/07/2023)   Social Connection and Isolation Panel [NHANES]    Frequency of Communication with Friends and Family: Once a week    Frequency of Social Gatherings with Friends and Family: Once a week    Attends Religious Services:  Never    Active Member  of Clubs or Organizations: Yes    Attends Banker Meetings: Never    Marital Status: Widowed  Intimate Partner Violence: Not At Risk (09/17/2022)   Humiliation, Afraid, Rape, and Kick questionnaire    Fear of Current or Ex-Partner: No    Emotionally Abused: No    Physically Abused: No    Sexually Abused: No    FAMILY HISTORY: Family History  Problem Relation Age of Onset   Breast cancer Sister        in her 34's-70s   Heart disease Brother        CABG   Heart disease Brother        CABG   Skin cancer Brother        melanoma   Colon cancer Cousin    Heart attack Mother    Heart attack Father    Prostate cancer Brother     Review of Systems  Constitutional:  Positive for fatigue. Negative for appetite change, chills, fever and unexpected weight change.  HENT:   Negative for hearing loss, lump/mass and trouble swallowing.   Eyes:  Negative for eye problems and icterus.  Respiratory:  Positive for shortness of breath. Negative for chest tightness and cough.   Cardiovascular:  Negative for chest pain, leg swelling and palpitations.  Gastrointestinal:  Negative for abdominal distention, abdominal pain, constipation, diarrhea, nausea and vomiting.  Endocrine: Negative for hot flashes.  Genitourinary:  Negative for difficulty urinating.   Musculoskeletal:  Negative for arthralgias and gait problem.  Skin:  Positive for rash. Negative for itching.  Neurological:  Positive for headaches. Negative for dizziness, extremity weakness, gait problem, light-headedness, numbness, seizures and speech difficulty.  Hematological:  Negative for adenopathy. Does not bruise/bleed easily.  Psychiatric/Behavioral:  Negative for depression. The patient is not nervous/anxious.       PHYSICAL EXAMINATION    Vitals:   09/12/23 1423  BP: (!) 141/83  Pulse: (!) 134  Resp: 17  Temp: 98 F (36.7 C)  SpO2: 99%    Physical Exam Constitutional:      General: She is not in acute  distress.    Appearance: Normal appearance. She is not toxic-appearing.  HENT:     Head: Normocephalic and atraumatic.     Mouth/Throat:     Mouth: Mucous membranes are moist.     Pharynx: Oropharynx is clear. No oropharyngeal exudate or posterior oropharyngeal erythema.  Eyes:     General: No scleral icterus. Cardiovascular:     Rate and Rhythm: Regular rhythm. Tachycardia present.     Pulses: Normal pulses.     Heart sounds: Normal heart sounds.  Pulmonary:     Effort: Pulmonary effort is normal.     Breath sounds: Normal breath sounds.  Abdominal:     General: Abdomen is flat. Bowel sounds are normal. There is no distension.     Palpations: Abdomen is soft.     Tenderness: There is no abdominal tenderness.  Musculoskeletal:        General: No swelling.     Cervical back: Neck supple.  Lymphadenopathy:     Cervical: No cervical adenopathy.  Skin:    General: Skin is warm and dry.     Findings: No rash.  Neurological:     General: No focal deficit present.     Mental Status: She is alert.  Psychiatric:        Mood and Affect: Mood normal.  Behavior: Behavior normal.     LABORATORY DATA: Ordered and pending  ASSESSMENT and THERAPY PLAN:   Carcinoma of breast, estrogen receptor positive, stage 4, right (HCC) Kiana is a 63 year old woman with metastatic breast cancer here today for f/u on treatment with capecitabine that began last week.  She has changing rash and color.  She also has not been tolerating her current dose of capecitabine.  I recommended that she hold treatment for 1 week.  We will obtain labs with a CBC and c-Met.  We will give her 1 L of fluids today or tomorrow depending on the availability of the infusion room.  I have sent a lab for type and cross in case she needs to receive blood if her anemia has worsened from prior.  Since her rash has not worsened it is only changed in color I will hold on giving her any steroids at this time.  If her  symptoms worsen she knows to call us  back.  We will see her back in 1 week for labs and follow-up to discuss restarting Xeloda and a potential dose adjustment.    All questions were answered. The patient knows to call the clinic with any problems, questions or concerns. We can certainly see the patient much sooner if necessary.  Total encounter time:20 minutes*in face-to-face visit time, chart review, lab review, care coordination, order entry, and documentation of the encounter time.    Alwin Baars, NP 09/12/23 2:47 PM Medical Oncology and Hematology [facility] [address] [place], [place] [address] Tel. [phone]    Fax. [phone]  *Total Encounter Time as defined by the Centers for Medicare and Medicaid Services includes, in addition to the face-to-face time of a patient visit (documented in the note above) non-face-to-face time: obtaining and reviewing outside history, ordering and reviewing medications, tests or procedures, care coordination (communications with other health care professionals or caregivers) and documentation in the medical record.

## 2023-09-13 ENCOUNTER — Telehealth: Payer: Self-pay | Admitting: Adult Health

## 2023-09-13 LAB — CANCER ANTIGEN 27.29: CA 27.29: 1320.6 U/mL — ABNORMAL HIGH (ref 0.0–38.6)

## 2023-09-13 NOTE — Telephone Encounter (Signed)
 Patient scheduled appointments. Patient is aware of all appointment details.

## 2023-09-16 ENCOUNTER — Other Ambulatory Visit: Payer: Self-pay

## 2023-09-17 ENCOUNTER — Other Ambulatory Visit: Payer: Self-pay | Admitting: *Deleted

## 2023-09-17 ENCOUNTER — Telehealth: Payer: Self-pay | Admitting: Nurse Practitioner

## 2023-09-17 DIAGNOSIS — C7951 Secondary malignant neoplasm of bone: Secondary | ICD-10-CM

## 2023-09-17 DIAGNOSIS — C7801 Secondary malignant neoplasm of right lung: Secondary | ICD-10-CM

## 2023-09-17 DIAGNOSIS — Z170 Estrogen receptor positive status [ER+]: Secondary | ICD-10-CM

## 2023-09-17 DIAGNOSIS — C50911 Malignant neoplasm of unspecified site of right female breast: Secondary | ICD-10-CM

## 2023-09-17 DIAGNOSIS — D509 Iron deficiency anemia, unspecified: Secondary | ICD-10-CM

## 2023-09-17 NOTE — Telephone Encounter (Signed)
 Suzanne [name], can you please advise. Thank you.  Copied from CRM [id number]. Topic: General - Other  >> Sep 17, 2023  1:41 PM [place] wrote:  Reason for CRM: Suzanne [name] with [facility] needing to speak with someone regarding 3051 form for disenrollment due to insurance change - they provide personal care for the patient- please return call to [place] [phone]

## 2023-09-17 NOTE — Telephone Encounter (Signed)
 Signed PCS request - managed care disenrollment efaxed to [email or web address]

## 2023-09-17 NOTE — Telephone Encounter (Signed)
 I spoke to Suzanne [name] and she explained that they have been providing personal care services for the patient and they were just informed that she was disenrolled from [facility] in 2022 and they need a new PCS request ( form [id number] ) submitted to Medicaid [employer] so they can continue to provide her services.  I told her that I can complete the 3051 for PCP to sign and Felipa Horsfall provided me with the NPI # for Gasper Karst that is needed for the document.

## 2023-09-18 ENCOUNTER — Ambulatory Visit: Admitting: Hematology and Oncology

## 2023-09-18 ENCOUNTER — Encounter: Payer: Self-pay | Admitting: Adult Health

## 2023-09-18 ENCOUNTER — Other Ambulatory Visit: Payer: Self-pay | Admitting: *Deleted

## 2023-09-18 ENCOUNTER — Ambulatory Visit

## 2023-09-18 ENCOUNTER — Inpatient Hospital Stay (HOSPITAL_BASED_OUTPATIENT_CLINIC_OR_DEPARTMENT_OTHER): Admitting: Adult Health

## 2023-09-18 ENCOUNTER — Inpatient Hospital Stay

## 2023-09-18 ENCOUNTER — Other Ambulatory Visit

## 2023-09-18 VITALS — BP 137/86 | HR 118 | Temp 98.2°F | Resp 17 | Ht 59.0 in | Wt 180.9 lb

## 2023-09-18 DIAGNOSIS — D500 Iron deficiency anemia secondary to blood loss (chronic): Secondary | ICD-10-CM

## 2023-09-18 DIAGNOSIS — C50811 Malignant neoplasm of overlapping sites of right female breast: Secondary | ICD-10-CM

## 2023-09-18 DIAGNOSIS — C50911 Malignant neoplasm of unspecified site of right female breast: Secondary | ICD-10-CM | POA: Diagnosis not present

## 2023-09-18 DIAGNOSIS — Z170 Estrogen receptor positive status [ER+]: Secondary | ICD-10-CM | POA: Diagnosis not present

## 2023-09-18 DIAGNOSIS — E860 Dehydration: Secondary | ICD-10-CM

## 2023-09-18 DIAGNOSIS — C7951 Secondary malignant neoplasm of bone: Secondary | ICD-10-CM

## 2023-09-18 DIAGNOSIS — C7801 Secondary malignant neoplasm of right lung: Secondary | ICD-10-CM

## 2023-09-18 DIAGNOSIS — D509 Iron deficiency anemia, unspecified: Secondary | ICD-10-CM

## 2023-09-18 LAB — CBC WITH DIFFERENTIAL (CANCER CENTER ONLY)
Abs Immature Granulocytes: 0.03 10*3/uL (ref 0.00–0.07)
Basophils Absolute: 0 10*3/uL (ref 0.0–0.1)
Basophils Relative: 0 %
Eosinophils Absolute: 0.2 10*3/uL (ref 0.0–0.5)
Eosinophils Relative: 4 %
HCT: 28.7 % — ABNORMAL LOW (ref 36.0–46.0)
Hemoglobin: 8.4 g/dL — ABNORMAL LOW (ref 12.0–15.0)
Immature Granulocytes: 1 %
Lymphocytes Relative: 13 %
Lymphs Abs: 0.7 10*3/uL (ref 0.7–4.0)
MCH: 23.3 pg — ABNORMAL LOW (ref 26.0–34.0)
MCHC: 29.3 g/dL — ABNORMAL LOW (ref 30.0–36.0)
MCV: 79.7 fL — ABNORMAL LOW (ref 80.0–100.0)
Monocytes Absolute: 0.5 10*3/uL (ref 0.1–1.0)
Monocytes Relative: 9 %
Neutro Abs: 3.8 10*3/uL (ref 1.7–7.7)
Neutrophils Relative %: 73 %
Platelet Count: 227 10*3/uL (ref 150–400)
RBC: 3.6 MIL/uL — ABNORMAL LOW (ref 3.87–5.11)
RDW: 19.9 % — ABNORMAL HIGH (ref 11.5–15.5)
WBC Count: 5.2 10*3/uL (ref 4.0–10.5)
nRBC: 0.4 % — ABNORMAL HIGH (ref 0.0–0.2)

## 2023-09-18 LAB — CMP (CANCER CENTER ONLY)
ALT: 17 U/L (ref 0–44)
AST: 28 U/L (ref 15–41)
Albumin: 3.6 g/dL (ref 3.5–5.0)
Alkaline Phosphatase: 179 U/L — ABNORMAL HIGH (ref 38–126)
Anion gap: 6 (ref 5–15)
BUN: 18 mg/dL (ref 8–23)
CO2: 30 mmol/L (ref 22–32)
Calcium: 10.1 mg/dL (ref 8.9–10.3)
Chloride: 105 mmol/L (ref 98–111)
Creatinine: 1.05 mg/dL — ABNORMAL HIGH (ref 0.44–1.00)
GFR, Estimated: 59 mL/min — ABNORMAL LOW (ref >60)
Glucose, Bld: 143 mg/dL — ABNORMAL HIGH (ref 70–99)
Potassium: 3.7 mmol/L (ref 3.5–5.1)
Sodium: 141 mmol/L (ref 135–145)
Total Bilirubin: 0.6 mg/dL (ref 0.0–1.2)
Total Protein: 6.5 g/dL (ref 6.5–8.1)

## 2023-09-18 LAB — FERRITIN: Ferritin: 13 ng/mL (ref 11–307)

## 2023-09-18 LAB — IRON AND IRON BINDING CAPACITY (CC-WL,HP ONLY)
Iron: 38 ug/dL (ref 28–170)
Saturation Ratios: 7 % — ABNORMAL LOW (ref 10.4–31.8)
TIBC: 556 ug/dL — ABNORMAL HIGH (ref 250–450)
UIBC: 518 ug/dL — ABNORMAL HIGH (ref 148–442)

## 2023-09-18 MED ORDER — SODIUM CHLORIDE 0.9 % IV SOLN: Freq: Once | INTRAVENOUS | Status: AC

## 2023-09-18 MED ORDER — SODIUM CHLORIDE 0.9 % IV SOLN: INTRAVENOUS | Status: DC

## 2023-09-18 NOTE — Patient Instructions (Signed)

## 2023-09-18 NOTE — Progress Notes (Signed)
 [facility] Cancer Follow up:    Suzanne Dean, NP [address] [place] [address]   DIAGNOSIS:  Cancer Staging  Carcinoma of breast, estrogen receptor positive, stage 4, right (HCC) Staging form: Breast, AJCC 8th Edition - Clinical: Stage IV (pM1) - Signed by Percival Brace, NP on 02/15/2023    SUMMARY OF ONCOLOGIC HISTORY: Oncology History  Malignant neoplasm metastatic to bone [facility])  Carcinoma of breast, estrogen receptor positive, stage 4, right (HCC)  11/16/2018 Initial Diagnosis   Carcinoma of breast, estrogen receptor positive, stage 4, right (HCC)   02/15/2023 Cancer Staging   Staging form: Breast, AJCC 8th Edition - Clinical: Stage IV (pM1) - Signed by Percival Brace, NP on 02/15/2023     CURRENT THERAPY: Oral Capecitabine  (on hold)  INTERVAL HISTORY:  Discussed the use of AI scribe software for clinical note transcription with the patient, who gave verbal consent to proceed.  Suzanne [name] 63 y.o. female with a history of psoriasis and recent initiation of capecitabine  (Xeloda ), presents with persistent skin hyperpigmentation. The discoloration, initially severe, has improved but not returned to normal since discontinuing Xeloda  a week ago. The patient denies any itching or abnormal sensation in the discolored areas. She also reports a transient oral discomfort, similar to a canker sore, which resolves spontaneously within half an hour.  In addition to the skin and oral symptoms, the patient has been feeling "woozy" and has a high pulse rate. She admits to not drinking as much water as she should, which might be contributing to her symptoms. The patient also reports a history of nausea and dehydration, which improved after receiving IV fluids.   Patient Active Problem List   Diagnosis Date Noted   Iron deficiency anemia 09/22/2023   UGI bleed 05/27/2022   Acute prerenal azotemia 04/18/2022   Dehydration  04/18/2022   SIRS (systemic inflammatory response syndrome) (HCC) 04/18/2022   Allergic rhinitis 04/18/2022   Depression 04/18/2022   Anemia of chronic disease 04/18/2022   Bronchitis 04/18/2022   Acute bronchitis 04/17/2022   Intertrigo 01/10/2022   Bacterial vaginitis 01/10/2022   Sepsis due to cellulitis (HCC) 12/06/2021   B12 deficiency anemia 08/15/2020   Sensorineural hearing loss (SNHL) of both ears 08/07/2019   Intractable back pain 02/03/2019   Thoracic compression fracture, closed, initial encounter (HCC) 02/03/2019   Essential hypertension 02/03/2019   Genetic testing 01/27/2019   Acute pharyngitis    Bacteremia    FTT (failure to thrive) in adult    Fracture closed, humerus, shaft 01/09/2019   Pneumonia 12/25/2018   Acute respiratory disorder in immunocompromised patient (HCC) 12/25/2018   Family history of breast cancer    Family history of prostate cancer    Family history of melanoma    Family history of colon cancer    Pressure injury of skin 11/24/2018   Chronic respiratory failure with hypoxia and hypercapnia (HCC) 11/19/2018   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   Hypophosphatemia 11/19/2018   Hypocalcemia 11/19/2018   Aspiration pneumonia (HCC) 11/19/2018   SOB (shortness of breath)    Palliative care by specialist    Carcinoma of breast, estrogen receptor positive, stage 4, right (HCC) 11/16/2018   Hypoalbuminemia 11/16/2018   Pleural effusion 11/14/2018   Goals of care, counseling/discussion 11/06/2018   Malignant neoplasm of overlapping sites of right breast in female, estrogen receptor positive (HCC) 10/31/2018   Malignant neoplasm metastatic to lung (HCC) 10/31/2018   Pain from bone metastases (HCC)  10/31/2018   Class 2 obesity 10/31/2018   Bone injury    Pleural effusion, bilateral    Shortness of breath    Abnormal breast finding    Hypokalemia    Breast skin changes    Macrocytic anemia    Malignant neoplasm metastatic to bone (HCC)     Anemia, B12 deficiency 10/26/2018    is allergic to ferumoxytol .  MEDICAL HISTORY: Past Medical History:  Diagnosis Date   Anemia    Cancer (HCC)    Family history of breast cancer    Family history of colon cancer    Family history of melanoma    Family history of prostate cancer    Gallstones    History of hiatal hernia    History of kidney stones    Mastitis    reports history of recurrent mastitis   Metastatic breast cancer    Obesity    Psoriasis     SURGICAL HISTORY: Past Surgical History:  Procedure Laterality Date   CYSTOSCOPY/URETEROSCOPY/HOLMIUM LASER/STENT PLACEMENT Left 10/17/2022   Procedure: CYSTOSCOPY, LEFT RETROGRADE PYELOGRAM, LEFT URETEROSCOPY, HOLMIUM LASER LITHOTRIPSY, BASKET EXTRACTION, AND LEFT URETERAL STENT PLACEMENT;  Surgeon: Adelbert Homans, MD;  Location: [facility];  Service: Urology;  Laterality: Left;  45 MINUTES   ESOPHAGOGASTRODUODENOSCOPY (EGD) WITH PROPOFOL  N/A 05/27/2022   Procedure: ESOPHAGOGASTRODUODENOSCOPY (EGD) WITH PROPOFOL ;  Surgeon: Evangeline Hilts, MD;  Location: [facility];  Service: Gastroenterology;  Laterality: N/A;   EYE SURGERY     cataract   GLAUCOMA SURGERY  late 1990's   IR THORACENTESIS ASP PLEURAL SPACE W/IMG GUIDE  10/28/2018   IR THORACENTESIS ASP PLEURAL SPACE W/IMG GUIDE  10/29/2018   OPEN REDUCTION INTERNAL FIXATION (ORIF) DISTAL RADIAL FRACTURE Left 01/09/2019   Procedure: OPEN REDUCTION INTERNAL FIXATION (ORIF) HUMERAL FRACTURE;  Surgeon: Winston Hawking, MD;  Location: [facility];  Service: Orthopedics;  Laterality: Left;    SOCIAL HISTORY: Social History   Socioeconomic History   Marital status: Widowed    Spouse name: Not on file   Number of children: 1   Years of education: Not on file   Highest education level: Not on file  Occupational History   Occupation: Visual merchandiser Programs    Comment: Self-employed  Tobacco Use   Smoking status: Never   Smokeless tobacco: Never   Tobacco comments:     second hand smoke exposure  Vaping Use   Vaping status: Never Used  Substance and Sexual Activity   Alcohol use: No   Drug use: No   Sexual activity: Not Currently  Other Topics Concern   Not on file  Social History Narrative   Has a daughter named Bridgette Campus. Daughter has CP.   Social Drivers of Corporate investment banker Strain: Low Risk  (09/17/2022)   Overall Financial Resource Strain (CARDIA)    Difficulty of Paying Living Expenses: Not hard at all  Food Insecurity: No Food Insecurity (09/17/2022)   Hunger Vital Sign    Worried About Running Out of Food in the Last Year: Never true    Ran Out of Food in the Last Year: Never true  Transportation Needs: No Transportation Needs (09/17/2022)   PRAPARE - Administrator, Civil Service (Medical): No    Lack of Transportation (Non-Medical): No  Physical Activity: Inactive (03/07/2023)   Exercise Vital Sign    Days of Exercise per Week: 0 days    Minutes of Exercise per Session: 0 min  Stress: Stress Concern Present (03/07/2023)  [employer] of Occupational Health - Occupational Stress Questionnaire    Feeling of Stress : To some extent  Social Connections: Socially Isolated (03/07/2023)   Social Connection and Isolation Panel [NHANES]    Frequency of Communication with Friends and Family: Once a week    Frequency of Social Gatherings with Friends and Family: Once a week    Attends Religious Services: Never    Database administrator or Organizations: Yes    Attends Banker Meetings: Never    Marital Status: Widowed  Intimate Partner Violence: Not At Risk (09/17/2022)   Humiliation, Afraid, Rape, and Kick questionnaire    Fear of Current or Ex-Partner: No    Emotionally Abused: No    Physically Abused: No    Sexually Abused: No    FAMILY HISTORY: Family History  Problem Relation Age of Onset   Breast cancer Sister        in her 33's-70s   Heart disease Brother        CABG   Heart disease  Brother        CABG   Skin cancer Brother        melanoma   Colon cancer Cousin    Heart attack Mother    Heart attack Father    Prostate cancer Brother     Review of Systems  Constitutional:  Positive for fatigue. Negative for appetite change, chills, fever and unexpected weight change.  HENT:   Negative for hearing loss, lump/mass and trouble swallowing.   Eyes:  Negative for eye problems and icterus.  Respiratory:  Negative for chest tightness, cough and shortness of breath.   Cardiovascular:  Negative for chest pain, leg swelling and palpitations.  Gastrointestinal:  Negative for abdominal distention, abdominal pain, constipation, diarrhea, nausea and vomiting.  Endocrine: Negative for hot flashes.  Genitourinary:  Negative for difficulty urinating.   Musculoskeletal:  Negative for arthralgias.  Skin:  Positive for rash. Negative for itching.  Neurological:  Negative for dizziness, extremity weakness, headaches and numbness.  Hematological:  Negative for adenopathy. Does not bruise/bleed easily.  Psychiatric/Behavioral:  Negative for depression. The patient is not nervous/anxious.       PHYSICAL EXAMINATION    Vitals:   09/18/23 1138  BP: 137/86  Pulse: (!) 118  Resp: 17  Temp: 98.2 F (36.8 C)  SpO2: 100%    Physical Exam Constitutional:      General: She is not in acute distress.    Appearance: Normal appearance. She is not toxic-appearing.  HENT:     Head: Normocephalic and atraumatic.     Mouth/Throat:     Mouth: Mucous membranes are moist.     Pharynx: Oropharynx is clear. No oropharyngeal exudate or posterior oropharyngeal erythema.  Eyes:     General: No scleral icterus. Cardiovascular:     Rate and Rhythm: Normal rate and regular rhythm.     Pulses: Normal pulses.     Heart sounds: Normal heart sounds.  Pulmonary:     Effort: Pulmonary effort is normal.     Breath sounds: Normal breath sounds.  Abdominal:     General: Abdomen is flat. Bowel  sounds are normal. There is no distension.     Palpations: Abdomen is soft.     Tenderness: There is no abdominal tenderness.  Musculoskeletal:        General: No swelling.     Cervical back: Neck supple.  Lymphadenopathy:     Cervical: No cervical adenopathy.  Skin:  General: Skin is warm and dry.     Findings: No rash.  Neurological:     General: No focal deficit present.     Mental Status: She is alert.  Psychiatric:        Mood and Affect: Mood normal.        Behavior: Behavior normal.     LABORATORY DATA:  CBC    Component Value Date/Time   WBC 5.2 09/18/2023 1113   WBC 5.1 10/16/2022 1301   RBC 3.60 (L) 09/18/2023 1113   HGB 8.4 (L) 09/18/2023 1113   HCT 28.7 (L) 09/18/2023 1113   PLT 227 09/18/2023 1113   MCV 79.7 (L) 09/18/2023 1113   MCH 23.3 (L) 09/18/2023 1113   MCHC 29.3 (L) 09/18/2023 1113   RDW 19.9 (H) 09/18/2023 1113   LYMPHSABS 0.7 09/18/2023 1113   MONOABS 0.5 09/18/2023 1113   EOSABS 0.2 09/18/2023 1113   BASOSABS 0.0 09/18/2023 1113    CMP     Component Value Date/Time   NA 141 09/18/2023 1113   K 3.7 09/18/2023 1113   CL 105 09/18/2023 1113   CO2 30 09/18/2023 1113   GLUCOSE 143 (H) 09/18/2023 1113   BUN 18 09/18/2023 1113   CREATININE 1.05 (H) 09/18/2023 1113   CALCIUM  10.1 09/18/2023 1113   PROT 6.5 09/18/2023 1113   ALBUMIN  3.6 09/18/2023 1113   AST 28 09/18/2023 1113   ALT 17 09/18/2023 1113   ALKPHOS 179 (H) 09/18/2023 1113   BILITOT 0.6 09/18/2023 1113   GFRNONAA 59 (L) 09/18/2023 1113   GFRAA >60 02/24/2020 1454   GFRAA >60 07/24/2019 1453     ASSESSMENT and THERAPY PLAN:   Carcinoma of breast, estrogen receptor positive, stage 4, right (HCC) Suzanne [name] is a 63 year old woman with metastatic breast cancer here today for f/u on treatment with capecitabine  that began 2 weeks ago and we started holding last week due to tachycardia, dehydration, and rash.     Tachycardia Pulse improved to 117 from 134, likely due to  dehydration. Improved with prior IV fluids. - Administer IV fluids today  Dehydration Kidney function improved from 1.14 to 1.05. Symptoms include wooziness and reduced fluid intake, likely contributing to tachycardia. - Administer IV fluids if available.  Psoriasis and Hyperpigmentation Psoriasis exacerbated with hyperpigmentation, potentially related to Xeloda . Improvement noted since discontinuation. Dermatology appointment scheduled. - Continue to hold Xeloda . - Follow up with dermatology on Monday. - Follow up with Dr. Arno Bibles next week for labs and discussion of next steps with her treatment.   The above was reviewed with Dr. Arno Bibles in detail who agreed with the assessment and plan.     All questions were answered. The patient knows to call the clinic with any problems, questions or concerns. We can certainly see the patient much sooner if necessary.  Total encounter time:30 minutes*in face-to-face visit time, chart review, lab review, care coordination, order entry, and documentation of the encounter time.    Alwin Baars, NP 09/22/23 9:50 PM Medical Oncology and Hematology [facility] [address] [place], [place] [address] Tel. [phone]    Fax. [phone]  *Total Encounter Time as defined by the Centers for Medicare and Medicaid Services includes, in addition to the face-to-face time of a patient visit (documented in the note above) non-face-to-face time: obtaining and reviewing outside history, ordering and reviewing medications, tests or procedures, care coordination (communications with other health care professionals or caregivers) and documentation in the medical record.

## 2023-09-19 ENCOUNTER — Ambulatory Visit

## 2023-09-19 LAB — CANCER ANTIGEN 27.29: CA 27.29: 1192.3 U/mL — ABNORMAL HIGH (ref 0.0–38.6)

## 2023-09-22 ENCOUNTER — Encounter: Payer: Self-pay | Admitting: Adult Health

## 2023-09-22 DIAGNOSIS — D509 Iron deficiency anemia, unspecified: Secondary | ICD-10-CM | POA: Insufficient documentation

## 2023-09-22 NOTE — Assessment & Plan Note (Addendum)
 Suzanne [name] is a 63 year old woman with metastatic breast cancer here today for f/u on treatment with capecitabine  that began 2 weeks ago and we started holding last week due to tachycardia, dehydration, and rash.     Tachycardia Pulse improved to 117 from 134, likely due to dehydration. Improved with prior IV fluids. - Administer IV fluids today  Dehydration Kidney function improved from 1.14 to 1.05. Symptoms include wooziness and reduced fluid intake, likely contributing to tachycardia. - Administer IV fluids if available.  Psoriasis and Hyperpigmentation Psoriasis exacerbated with hyperpigmentation, potentially related to Xeloda . Improvement noted since discontinuation. Dermatology appointment scheduled. - Continue to hold Xeloda . - Follow up with dermatology on Monday. - Follow up with Dr. Arno Bibles next week for labs and discussion of next steps with her treatment.   The above was reviewed with Dr. Arno Bibles in detail who agreed with the assessment and plan.

## 2023-09-23 ENCOUNTER — Telehealth: Payer: Self-pay | Admitting: *Deleted

## 2023-09-23 ENCOUNTER — Other Ambulatory Visit: Payer: Self-pay | Admitting: Adult Health

## 2023-09-23 ENCOUNTER — Ambulatory Visit: Admitting: Dermatology

## 2023-09-23 ENCOUNTER — Other Ambulatory Visit: Payer: Self-pay | Admitting: *Deleted

## 2023-09-23 DIAGNOSIS — C7951 Secondary malignant neoplasm of bone: Secondary | ICD-10-CM

## 2023-09-23 DIAGNOSIS — C50811 Malignant neoplasm of overlapping sites of right female breast: Secondary | ICD-10-CM

## 2023-09-23 DIAGNOSIS — D500 Iron deficiency anemia secondary to blood loss (chronic): Secondary | ICD-10-CM

## 2023-09-23 DIAGNOSIS — C7801 Secondary malignant neoplasm of right lung: Secondary | ICD-10-CM

## 2023-09-23 DIAGNOSIS — Z170 Estrogen receptor positive status [ER+]: Secondary | ICD-10-CM

## 2023-09-23 NOTE — Telephone Encounter (Signed)
-----   Message from Conway Dennis sent at 09/22/2023  9:49 PM EDT ----- Patient is iron deficient.  Please see if she is willing to undergo IV iron--see if she tolerated in past and reach out to prior auth to see if iron ordered is the right one for her insurance (I ordered one she previously had in hopes it is still ok).

## 2023-09-23 NOTE — Telephone Encounter (Signed)
 Per Alwin Baars, NP, made pt aware of message below. Pt verbalizes understanding. Providers now reviewing iron tx brand due to allergic reaction in the past.

## 2023-09-24 ENCOUNTER — Encounter: Payer: Self-pay | Admitting: Hematology and Oncology

## 2023-09-24 ENCOUNTER — Other Ambulatory Visit (HOSPITAL_COMMUNITY): Payer: Self-pay

## 2023-09-24 ENCOUNTER — Inpatient Hospital Stay

## 2023-09-24 ENCOUNTER — Inpatient Hospital Stay (HOSPITAL_BASED_OUTPATIENT_CLINIC_OR_DEPARTMENT_OTHER): Admitting: Hematology and Oncology

## 2023-09-24 VITALS — BP 142/67 | HR 103 | Temp 98.3°F | Resp 18 | Ht 59.0 in | Wt 181.4 lb

## 2023-09-24 DIAGNOSIS — C50811 Malignant neoplasm of overlapping sites of right female breast: Secondary | ICD-10-CM | POA: Diagnosis not present

## 2023-09-24 DIAGNOSIS — C7951 Secondary malignant neoplasm of bone: Secondary | ICD-10-CM

## 2023-09-24 DIAGNOSIS — Z170 Estrogen receptor positive status [ER+]: Secondary | ICD-10-CM

## 2023-09-24 DIAGNOSIS — D500 Iron deficiency anemia secondary to blood loss (chronic): Secondary | ICD-10-CM

## 2023-09-24 DIAGNOSIS — C7801 Secondary malignant neoplasm of right lung: Secondary | ICD-10-CM

## 2023-09-24 LAB — CBC WITH DIFFERENTIAL (CANCER CENTER ONLY)
Abs Immature Granulocytes: 0.02 K/uL (ref 0.00–0.07)
Basophils Absolute: 0.1 K/uL (ref 0.0–0.1)
Basophils Relative: 1 %
Eosinophils Absolute: 0.2 K/uL (ref 0.0–0.5)
Eosinophils Relative: 4 %
HCT: 26 % — ABNORMAL LOW (ref 36.0–46.0)
Hemoglobin: 7.7 g/dL — ABNORMAL LOW (ref 12.0–15.0)
Immature Granulocytes: 0 %
Lymphocytes Relative: 15 %
Lymphs Abs: 0.7 K/uL (ref 0.7–4.0)
MCH: 24.1 pg — ABNORMAL LOW (ref 26.0–34.0)
MCHC: 29.6 g/dL — ABNORMAL LOW (ref 30.0–36.0)
MCV: 81.3 fL (ref 80.0–100.0)
Monocytes Absolute: 0.5 K/uL (ref 0.1–1.0)
Monocytes Relative: 10 %
Neutro Abs: 3.2 K/uL (ref 1.7–7.7)
Neutrophils Relative %: 70 %
Platelet Count: 197 K/uL (ref 150–400)
RBC: 3.2 MIL/uL — ABNORMAL LOW (ref 3.87–5.11)
RDW: 20.1 % — ABNORMAL HIGH (ref 11.5–15.5)
WBC Count: 4.6 K/uL (ref 4.0–10.5)
nRBC: 0 % (ref 0.0–0.2)

## 2023-09-24 LAB — CMP (CANCER CENTER ONLY)
ALT: 22 U/L (ref 0–44)
AST: 37 U/L (ref 15–41)
Albumin: 3.6 g/dL (ref 3.5–5.0)
Alkaline Phosphatase: 186 U/L — ABNORMAL HIGH (ref 38–126)
Anion gap: 8 (ref 5–15)
BUN: 15 mg/dL (ref 8–23)
CO2: 31 mmol/L (ref 22–32)
Calcium: 10 mg/dL (ref 8.9–10.3)
Chloride: 101 mmol/L (ref 98–111)
Creatinine: 1.03 mg/dL — ABNORMAL HIGH (ref 0.44–1.00)
GFR, Estimated: >60 mL/min (ref >60)
Glucose, Bld: 104 mg/dL — ABNORMAL HIGH (ref 70–99)
Potassium: 3.6 mmol/L (ref 3.5–5.1)
Sodium: 140 mmol/L (ref 135–145)
Total Bilirubin: 0.7 mg/dL (ref 0.0–1.2)
Total Protein: 6.4 g/dL — ABNORMAL LOW (ref 6.5–8.1)

## 2023-09-24 LAB — FERRITIN: Ferritin: 12 ng/mL (ref 11–307)

## 2023-09-24 NOTE — Progress Notes (Signed)
 [facility] Cancer Follow up:    [name], NP [address] [address] [place] [address]   DIAGNOSIS:  Cancer Staging  Carcinoma of breast, estrogen receptor positive, stage 4, right (HCC) Staging form: Breast, AJCC 8th Edition - Clinical: Stage IV (pM1) - Signed by Crawford Morna Pickle, NP on 02/15/2023    SUMMARY OF ONCOLOGIC HISTORY: Oncology History  Malignant neoplasm metastatic to bone [facility])  Carcinoma of breast, estrogen receptor positive, stage 4, right (HCC)  11/16/2018 Initial Diagnosis   Carcinoma of breast, estrogen receptor positive, stage 4, right (HCC)   02/15/2023 Cancer Staging   Staging form: Breast, AJCC 8th Edition - Clinical: Stage IV (pM1) - Signed by Crawford Morna Pickle, NP on 02/15/2023     CURRENT THERAPY: Oral Capecitabine  (on hold)  INTERVAL HISTORY:  Discussed the use of AI scribe software for clinical note transcription with the patient, who gave verbal consent to proceed.  History of Present Illness Suzanne [name] is a 63 year old female with metastatic adenocarcinoma who presents with a rash and nausea after starting Xeloda .  She developed a rash after starting Xeloda , which she has been taking for about a week. The rash is located in the creases of her legs and other areas where her psoriasis typically flares up. It is characterized by a noticeable change in skin color, described as darkening or hyperpigmentation. She missed an appointment with her dermatologist due to the dermatologist being out sick. She has been on Otezla  for about eight months for psoriasis.  In addition to the rash, she experiences significant nausea, fatigue, and occasional headaches while on Xeloda . The nausea is severe enough to impact her eating, as she feels nauseous every time she tries to eat. Despite this, she has not been able to fully assess the effectiveness of this approach due to the short duration of Xeloda  use before it was  held.  She has previously been treated with Ibrance , Orserdu , Truqap , and Faslodex . She has had a hypersensitivity reaction to iron infusions in the past, characterized by facial flushing, but has managed this with Benadryl . She is considering resuming oral iron supplements as her iron stores are low normal.   Patient Active Problem List   Diagnosis Date Noted   Iron deficiency anemia 09/22/2023   UGI bleed 05/27/2022   Acute prerenal azotemia 04/18/2022   Dehydration 04/18/2022   SIRS (systemic inflammatory response syndrome) (HCC) 04/18/2022   Allergic rhinitis 04/18/2022   Depression 04/18/2022   Anemia of chronic disease 04/18/2022   Bronchitis 04/18/2022   Acute bronchitis 04/17/2022   Intertrigo 01/10/2022   Bacterial vaginitis 01/10/2022   Sepsis due to cellulitis (HCC) 12/06/2021   B12 deficiency anemia 08/15/2020   Sensorineural hearing loss (SNHL) of both ears 08/07/2019   Intractable back pain 02/03/2019   Thoracic compression fracture, closed, initial encounter (HCC) 02/03/2019   Essential hypertension 02/03/2019   Genetic testing 01/27/2019   Acute pharyngitis    Bacteremia    FTT (failure to thrive) in adult    Fracture closed, humerus, shaft 01/09/2019   Pneumonia 12/25/2018   Acute respiratory disorder in immunocompromised patient (HCC) 12/25/2018   Family history of breast cancer    Family history of prostate cancer    Family history of melanoma    Family history of colon cancer    Pressure injury of skin 11/24/2018   Chronic respiratory failure with hypoxia and hypercapnia (HCC) 11/19/2018   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  Hypophosphatemia 11/19/2018   Hypocalcemia 11/19/2018   Aspiration pneumonia (HCC) 11/19/2018   SOB (shortness of breath)    Palliative care by specialist    Carcinoma of breast, estrogen receptor positive, stage 4, right (HCC) 11/16/2018   Hypoalbuminemia 11/16/2018   Pleural effusion 11/14/2018   Goals of care,  counseling/discussion 11/06/2018   Malignant neoplasm of overlapping sites of right breast in female, estrogen receptor positive (HCC) 10/31/2018   Malignant 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  MEDICAL HISTORY: Past Medical History:  Diagnosis Date   Anemia    Cancer (HCC)    Family history of breast cancer    Family history of colon cancer    Family history of melanoma    Family history of prostate cancer    Gallstones    History of hiatal hernia    History of kidney stones    Mastitis    reports history of recurrent mastitis   Metastatic breast cancer    Obesity    Psoriasis     SURGICAL HISTORY: Past Surgical History:  Procedure Laterality Date   CYSTOSCOPY/URETEROSCOPY/HOLMIUM LASER/STENT PLACEMENT Left 10/17/2022   Procedure: CYSTOSCOPY, LEFT RETROGRADE PYELOGRAM, LEFT URETEROSCOPY, HOLMIUM LASER LITHOTRIPSY, BASKET EXTRACTION, AND LEFT URETERAL STENT PLACEMENT;  Surgeon: Devere Lonni Righter, MD;  Location: [facility];  Service: Urology;  Laterality: Left;  45 MINUTES   ESOPHAGOGASTRODUODENOSCOPY (EGD) WITH PROPOFOL  N/A 05/27/2022   Procedure: ESOPHAGOGASTRODUODENOSCOPY (EGD) WITH PROPOFOL ;  Surgeon: Burnette Fallow, MD;  Location: [facility];  Service: Gastroenterology;  Laterality: N/A;   EYE SURGERY     cataract   GLAUCOMA SURGERY  late 1990's   IR THORACENTESIS ASP PLEURAL SPACE W/IMG GUIDE  10/28/2018   IR THORACENTESIS ASP PLEURAL SPACE W/IMG GUIDE  10/29/2018   OPEN REDUCTION INTERNAL FIXATION (ORIF) DISTAL RADIAL FRACTURE Left 01/09/2019   Procedure: OPEN REDUCTION INTERNAL FIXATION (ORIF) HUMERAL  FRACTURE;  Surgeon: Kay Kemps, MD;  Location: [facility];  Service: Orthopedics;  Laterality: Left;    SOCIAL HISTORY: Social History   Socioeconomic History   Marital status: Widowed    Spouse name: Not on file   Number of children: 1   Years of education: Not on file   Highest education level: Not on file  Occupational History   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 Daughter has CP.   Social Drivers of Health   Financial Resource Strain: Low Risk  (09/17/2022)   Overall Financial Resource Strain (CARDIA)    Difficulty of Paying Living Expenses: Not hard at all  Food Insecurity: No Food Insecurity (09/17/2022)   Hunger Vital Sign    Worried About Running  Out of Food in the Last Year: Never true    Ran Out of Food in the Last Year: Never true  Transportation Needs: No Transportation Needs (09/17/2022)   PRAPARE - Administrator, Civil Service (Medical): No    Lack of Transportation (Non-Medical): No  Physical Activity: Inactive (03/07/2023)   Exercise Vital Sign    Days of Exercise per Week: 0 days    Minutes of Exercise per Session: 0 min  Stress: Stress Concern Present (03/07/2023)   [employer] of Occupational Health - Occupational Stress Questionnaire    Feeling of Stress : To some extent  Social Connections: Socially Isolated (03/07/2023)   Social Connection and Isolation Panel [NHANES]    Frequency of Communication with Friends and Family: Once a week    Frequency of Social Gatherings with Friends and Family: Once a week    Attends Religious Services: Never    Loss Adjuster, Chartered or Organizations: Yes    Attends Banker Meetings: Never    Marital Status: Widowed  Intimate Partner Violence: Not At Risk (09/17/2022)   Humiliation, Afraid, Rape, and Kick questionnaire    Fear of Current or Ex-Partner: No    Emotionally Abused: No    Physically Abused: No    Sexually Abused: No    FAMILY HISTORY: Family History  Problem Relation Age of Onset   Breast cancer Sister        in her 86's-70s   Heart disease Brother        CABG   Heart disease Brother        CABG   Skin cancer Brother        melanoma   Colon cancer Cousin    Heart attack Mother    Heart attack Father    Prostate cancer Brother     Review of Systems  Constitutional:  Positive for fatigue. Negative for appetite change, chills, fever and unexpected weight change.  HENT:   Negative for hearing loss, lump/mass and trouble swallowing.   Eyes:  Negative for eye problems and icterus.  Respiratory:  Negative for chest tightness, cough and shortness of breath.   Cardiovascular:  Negative for chest pain, leg swelling and palpitations.  Gastrointestinal:  Negative for abdominal distention, abdominal pain, constipation, diarrhea, nausea and vomiting.  Endocrine: Negative for hot flashes.  Genitourinary:  Negative for difficulty urinating.   Musculoskeletal:  Negative for arthralgias.  Skin:  Positive for rash. Negative for itching.  Neurological:  Negative for dizziness, extremity weakness, headaches and numbness.  Hematological:  Negative for adenopathy. Does not bruise/bleed easily.  Psychiatric/Behavioral:  Negative for depression. The patient is not nervous/anxious.       PHYSICAL EXAMINATION    Vitals:   09/24/23 1108  BP: (!) 142/67  Pulse: (!) 103  Resp: 18  Temp: 98.3 F (36.8 C)  SpO2: 100%     She appears tired, in wheel chair, no distress Skin rash which was her psoriasis definitely appears hyperpigmented. No LE edema.  LABORATORY DATA:  CBC     Component Value Date/Time   WBC 4.6 09/24/2023 1032   WBC 5.1 10/16/2022 1301   RBC 3.20 (L) 09/24/2023 1032   HGB 7.7 (L) 09/24/2023 1032   HCT 26.0 (L) 09/24/2023 1032   PLT 197 09/24/2023 1032   MCV 81.3 09/24/2023 1032   MCH 24.1 (L) 09/24/2023 1032   MCHC 29.6 (L) 09/24/2023 1032   RDW 20.1 (H) 09/24/2023 1032   LYMPHSABS 0.7  09/24/2023 1032   MONOABS 0.5 09/24/2023 1032   EOSABS 0.2 09/24/2023 1032   BASOSABS 0.1 09/24/2023 1032    CMP     Component Value Date/Time   NA 140 09/24/2023 1032   K 3.6 09/24/2023 1032   CL 101 09/24/2023 1032   CO2 31 09/24/2023 1032   GLUCOSE 104 (H) 09/24/2023 1032   BUN 15 09/24/2023 1032   CREATININE 1.03 (H) 09/24/2023 1032   CALCIUM  10.0 09/24/2023 1032   PROT 6.4 (L) 09/24/2023 1032   ALBUMIN  3.6 09/24/2023 1032   AST 37 09/24/2023 1032   ALT 22 09/24/2023 1032   ALKPHOS 186 (H) 09/24/2023 1032   BILITOT 0.7 09/24/2023 1032   GFRNONAA >60 09/24/2023 1032   GFRAA >60 02/24/2020 1454   GFRAA >60 07/24/2019 1453     ASSESSMENT and THERAPY PLAN:   Malignant neoplasm metastatic to bone [facility]) Assessment and Plan Assessment & Plan Metastatic breast cancer. Metastatic adenocarcinoma with prior treatments including Ibrance , Orserdu , Truqap , and Faslodex . Current Xeloda  treatment held due to rash, nausea, and fatigue. Considering dose reduction or switch to Enhertu,  - Consider dose reduction of Xeloda  to three pills in the morning and three in the afternoon. - Consider alternative regimen of Xeloda  with one week on, one week off. - Consult dermatologist regarding rash and potential link to Xeloda  or Otezla . - Consider switching to Enhertu if rash persists or worsens. - Monitor blood function, liver, and perform echocardiogram every three months if on Enhertu.  Rash due to medication Rash developed after starting Xeloda , with hyperpigmentation. Differential includes Xeloda  vs hyperpigmentation from Otezla . - Consult  dermatologist for evaluation of rash and potential link to medications. - Consider dose reduction of Xeloda  or alternative treatment if rash persists.  Nausea due to chemotherapy Nausea associated with chemotherapy treatment, particularly with Xeloda . - Continue anti-nausea medication 30 minutes before meals.  Fatigue due to chemotherapy Fatigue as a side effect of chemotherapy, including potential future treatment with Enhertu. - Monitor fatigue levels and adjust treatment as necessary.  Iron deficiency anemia Iron deficiency anemia with low normal iron stores. Previous hypersensitivity reaction to iron infusion noted. Prefers oral iron supplementation. - Initiate oral iron supplementation once daily or every other day for 3-4 months. - Monitor iron levels and symptoms.     All questions were answered. The patient knows to call the clinic with any problems, questions or concerns. We can certainly see the patient much sooner if necessary.  Total encounter time:30 minutes*in face-to-face visit time, chart review, lab review, care coordination, order entry, and documentation of the encounter time.    *Total Encounter Time as defined by the Centers for Medicare and Medicaid Services includes, in addition to the face-to-face time of a patient visit (documented in the note above) non-face-to-face time: obtaining and reviewing outside history, ordering and reviewing medications, tests or procedures, care coordination (communications with other health care professionals or caregivers) and documentation in the medical record.

## 2023-09-25 ENCOUNTER — Other Ambulatory Visit: Payer: Self-pay

## 2023-09-25 LAB — CANCER ANTIGEN 27.29: CA 27.29: 992.2 U/mL — ABNORMAL HIGH (ref 0.0–38.6)

## 2023-09-25 NOTE — Assessment & Plan Note (Signed)
 Assessment and Plan Assessment & Plan Metastatic breast cancer. Metastatic adenocarcinoma with prior treatments including Ibrance , Orserdu , Truqap , and Faslodex . Current Xeloda  treatment held due to rash, nausea, and fatigue. Considering dose reduction or switch to Enhertu,  - Consider dose reduction of Xeloda  to three pills in the morning and three in the afternoon. - Consider alternative regimen of Xeloda  with one week on, one week off. - Consult dermatologist regarding rash and potential link to Xeloda  or Otezla . - Consider switching to Enhertu if rash persists or worsens. - Monitor blood function, liver, and perform echocardiogram every three months if on Enhertu.  Rash due to medication Rash developed after starting Xeloda , with hyperpigmentation. Differential includes Xeloda  vs hyperpigmentation from Otezla . - Consult dermatologist for evaluation of rash and potential link to medications. - Consider dose reduction of Xeloda  or alternative treatment if rash persists.  Nausea due to chemotherapy Nausea associated with chemotherapy treatment, particularly with Xeloda . - Continue anti-nausea medication 30 minutes before meals.  Fatigue due to chemotherapy Fatigue as a side effect of chemotherapy, including potential future treatment with Enhertu. - Monitor fatigue levels and adjust treatment as necessary.  Iron deficiency anemia Iron deficiency anemia with low normal iron stores. Previous hypersensitivity reaction to iron infusion noted. Prefers oral iron supplementation. - Initiate oral iron supplementation once daily or every other day for 3-4 months. - Monitor iron levels and symptoms.

## 2023-09-27 ENCOUNTER — Telehealth: Payer: Self-pay

## 2023-09-27 NOTE — Telephone Encounter (Signed)
 Received call from pt's daughter, Bridgette Campus stating Zonya is "phlegmy" today and is concerned about this. She also reports that Mirakle is dizzy and had difficulty standing today d/t weakness, pulse was increased by transporting from bathroom to recliner but resolved at rest.  Called to speak with pt regarding these concerns and she reports she was advised by MD to start PO iron and she received this today. She reports "phlegm" is white and denies fever, cough, SHOB, Advised pt this is not an indication of infection if white, and she was encouraged to take OTC cetirizine  for allergies, and use neb as directed. She states, "I feel fine, really. Bridgette Campus just worries."  Advised pt that if her sx worsen she should go to ED over the weekend. She is agreeable.

## 2023-09-30 ENCOUNTER — Other Ambulatory Visit (HOSPITAL_COMMUNITY): Payer: Self-pay

## 2023-10-01 ENCOUNTER — Other Ambulatory Visit: Payer: Self-pay | Admitting: *Deleted

## 2023-10-01 ENCOUNTER — Telehealth: Payer: Self-pay

## 2023-10-01 ENCOUNTER — Other Ambulatory Visit

## 2023-10-01 ENCOUNTER — Ambulatory Visit: Admitting: Hematology and Oncology

## 2023-10-01 DIAGNOSIS — R978 Other abnormal tumor markers: Secondary | ICD-10-CM

## 2023-10-01 DIAGNOSIS — C7951 Secondary malignant neoplasm of bone: Secondary | ICD-10-CM

## 2023-10-01 NOTE — Telephone Encounter (Signed)
 Spoke with patient and confirmed appointment on 10/02/23

## 2023-10-02 ENCOUNTER — Other Ambulatory Visit (HOSPITAL_COMMUNITY): Payer: Self-pay

## 2023-10-02 ENCOUNTER — Telehealth: Payer: Self-pay

## 2023-10-02 ENCOUNTER — Inpatient Hospital Stay: Payer: Self-pay | Attending: Adult Health

## 2023-10-02 ENCOUNTER — Other Ambulatory Visit: Payer: Self-pay | Admitting: Pharmacist

## 2023-10-02 ENCOUNTER — Inpatient Hospital Stay (HOSPITAL_BASED_OUTPATIENT_CLINIC_OR_DEPARTMENT_OTHER): Payer: Self-pay | Admitting: Hematology and Oncology

## 2023-10-02 ENCOUNTER — Other Ambulatory Visit: Payer: Self-pay

## 2023-10-02 VITALS — BP 125/61 | HR 119 | Temp 98.2°F | Resp 18 | Wt 180.2 lb

## 2023-10-02 DIAGNOSIS — R21 Rash and other nonspecific skin eruption: Secondary | ICD-10-CM | POA: Insufficient documentation

## 2023-10-02 DIAGNOSIS — Z79899 Other long term (current) drug therapy: Secondary | ICD-10-CM | POA: Diagnosis not present

## 2023-10-02 DIAGNOSIS — Z808 Family history of malignant neoplasm of other organs or systems: Secondary | ICD-10-CM | POA: Insufficient documentation

## 2023-10-02 DIAGNOSIS — C7951 Secondary malignant neoplasm of bone: Secondary | ICD-10-CM

## 2023-10-02 DIAGNOSIS — L819 Disorder of pigmentation, unspecified: Secondary | ICD-10-CM | POA: Insufficient documentation

## 2023-10-02 DIAGNOSIS — D509 Iron deficiency anemia, unspecified: Secondary | ICD-10-CM | POA: Diagnosis not present

## 2023-10-02 DIAGNOSIS — Z170 Estrogen receptor positive status [ER+]: Secondary | ICD-10-CM | POA: Diagnosis not present

## 2023-10-02 DIAGNOSIS — C50811 Malignant neoplasm of overlapping sites of right female breast: Secondary | ICD-10-CM | POA: Diagnosis present

## 2023-10-02 DIAGNOSIS — Z803 Family history of malignant neoplasm of breast: Secondary | ICD-10-CM | POA: Diagnosis not present

## 2023-10-02 DIAGNOSIS — Z800 Family history of malignant neoplasm of digestive organs: Secondary | ICD-10-CM | POA: Insufficient documentation

## 2023-10-02 DIAGNOSIS — R110 Nausea: Secondary | ICD-10-CM | POA: Insufficient documentation

## 2023-10-02 DIAGNOSIS — R978 Other abnormal tumor markers: Secondary | ICD-10-CM

## 2023-10-02 DIAGNOSIS — T451X5A Adverse effect of antineoplastic and immunosuppressive drugs, initial encounter: Secondary | ICD-10-CM | POA: Diagnosis not present

## 2023-10-02 MED ORDER — CAPECITABINE 500 MG PO TABS
850.0000 mg/m2 | ORAL_TABLET | Freq: Two times a day (BID) | ORAL | 4 refills | Status: DC
  Filled 2023-10-02: qty 42, 7d supply, fill #0

## 2023-10-02 MED ORDER — CAPECITABINE 500 MG PO TABS
ORAL_TABLET | ORAL | 4 refills | Status: DC
  Filled 2023-10-02: qty 42, 14d supply, fill #0
  Filled 2023-10-02 (×2): qty 42, fill #0
  Filled 2023-10-16: qty 42, 14d supply, fill #1

## 2023-10-02 NOTE — Assessment & Plan Note (Signed)
 Assessment and Plan Assessment & Plan Metastatic breast cancer. Metastatic adenocarcinoma with prior treatments including Ibrance , Orserdu , Truqap , and Faslodex . Current Xeloda  treatment held due to rash, nausea, and fatigue.  Dose reduction to 1500 mg PO BID one week on one week off. - Consult dermatologist regarding rash and potential link to Xeloda  or Otezla . - Consider switching to Enhertu if toxicity persists.  Rash due to medication Rash developed after starting Xeloda , with hyperpigmentation.  Hyperpigmentation likely from Xeloda  This has improved some. Ok to continue monitoring.  Nausea due to chemotherapy Nausea associated with chemotherapy treatment, particularly with Xeloda . - Continue anti-nausea medication 30 minutes before meals.  Iron deficiency anemia Iron deficiency anemia with low normal iron stores Continue oral iron and monitoring

## 2023-10-02 NOTE — Telephone Encounter (Signed)
 Oral Oncology Patient Advocate Encounter  Change in insurance   Received notification that prior authorization for Capecitabine  is required.   PA submitted on 10/02/23  Key [id number]  Status is pending     Hansel Ley, CPhT Pharmacy Technician Coordinator [facility] [phone] (Ph) 10/02/2023 4:19 PM

## 2023-10-02 NOTE — Progress Notes (Signed)
 Specialty Pharmacy Refill Coordination Note  Suzanne [name] is a 63 y.o. female contacted today regarding refills of specialty medication(s) Capecitabine  (XELODA )   Patient requested Delivery   Delivery date: 10/07/23   Verified address: [address]   [place] [address]   Medication will be filled on 10/04/23.   Routed to Ben/Adrienne to look into part B coverage.

## 2023-10-02 NOTE — Progress Notes (Signed)
 Specialty Pharmacy Ongoing Clinical Assessment Note  Suzanne [name] is a 63 y.o. female who is being followed by the specialty pharmacy service for RxSp Oncology   Patient's specialty medication(s) reviewed today: Capecitabine  (XELODA )   Missed doses in the last 4 weeks: More than 5   Patient/Caregiver did not have any additional questions or concerns.   Therapeutic benefit summary: Unable to assess   Adverse events/side effects summary: Experienced adverse events/side effects (fatigue, nausea, diarrhea, hyperpigmentation in areas of pre-existing psoriasis. led provider to hold medication followed by decrease in dose)   Patient's therapy is appropriate to: Continue    Goals Addressed             This Visit's Progress    Slow Disease Progression       Patient is unable to be assessed as therapy was recently initiated. Patient will maintain adherence          Follow up:  3 months  [name] Teniola Tseng Specialty Pharmacist

## 2023-10-02 NOTE — Progress Notes (Signed)
 [facility] Cancer Follow up:    [name], NP [address] [place] [address] [place] [address]   DIAGNOSIS:  Cancer Staging  Carcinoma of breast, estrogen receptor positive, stage 4, right (HCC) Staging form: Breast, AJCC 8th Edition - Clinical: Stage IV (pM1) - Signed by Percival Brace, NP on 02/15/2023    SUMMARY OF ONCOLOGIC HISTORY: Oncology History  Malignant neoplasm metastatic to bone [facility])  Carcinoma of breast, estrogen receptor positive, stage 4, right (HCC)  11/16/2018 Initial Diagnosis   Carcinoma of breast, estrogen receptor positive, stage 4, right (HCC)   02/15/2023 Cancer Staging   Staging form: Breast, AJCC 8th Edition - Clinical: Stage IV (pM1) - Signed by Percival Brace, NP on 02/15/2023     CURRENT THERAPY: Oral Capecitabine  (on hold)  INTERVAL HISTORY:  Discussed the use of AI scribe software for clinical note transcription with the patient, who gave verbal consent to proceed.  History of Present Illness  Suzanne [name] is a 63 year old female with breast cancer who presents for follow-up regarding her medication regimen and side effects.  She is currently on Xeloda , taking 1500 mg twice daily, one week on and one week off. There is a reduction in nausea since her medication dose was adjusted, although some nausea persists. Her tumor marker has decreased after two weeks of medication use. She is in the off week of her cycle and requires a prescription refill.  She notes a change in skin color of her rash, with her chest becoming lighter but persistent discoloration in her leg creases and darkening around her knuckles and fingertips, which she attributes to Xeloda . She has an upcoming dermatology appointment in June, which was rescheduled due to the dermatologist's illness, and she is attempting to secure an earlier appointment to address a rash.  She is interested in resuming Zoloft , an antidepressant she previously  took, but cannot recall the exact dosage. She confirms she does not need Xanax , which she used briefly in the past. She is wondering if we can refill her zoloft .  She continues to take medication for nausea and an iron supplement. She experiences persistent tiredness and a new symptom of frequent coughing, which she suspects is due to allergies. She uses an air purifier at home and plans to change its filter. No changes in oxygen  requirements, and no fever, chills, or worsening shortness of breath.   Patient Active Problem List   Diagnosis Date Noted   Iron deficiency anemia 09/22/2023   UGI bleed 05/27/2022   Acute prerenal azotemia 04/18/2022   Dehydration 04/18/2022   SIRS (systemic inflammatory response syndrome) (HCC) 04/18/2022   Allergic rhinitis 04/18/2022   Depression 04/18/2022   Anemia of chronic disease 04/18/2022   Bronchitis 04/18/2022   Acute bronchitis 04/17/2022   Intertrigo 01/10/2022   Bacterial vaginitis 01/10/2022   Sepsis due to cellulitis (HCC) 12/06/2021   B12 deficiency anemia 08/15/2020   Sensorineural hearing loss (SNHL) of both ears 08/07/2019   Intractable back pain 02/03/2019   Thoracic compression fracture, closed, initial encounter (HCC) 02/03/2019   Essential hypertension 02/03/2019   Genetic testing 01/27/2019   Acute pharyngitis    Bacteremia    FTT (failure to thrive) in adult    Fracture closed, humerus, shaft 01/09/2019   Pneumonia 12/25/2018   Acute respiratory disorder in immunocompromised patient (HCC) 12/25/2018   Family history of breast cancer    Family history of prostate cancer    Family history of  melanoma    Family history of colon cancer    Pressure injury of skin 11/24/2018   Chronic respiratory failure with hypoxia and hypercapnia (HCC) 11/19/2018   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   Hypophosphatemia 11/19/2018   Hypocalcemia 11/19/2018   Aspiration pneumonia (HCC) 11/19/2018   SOB (shortness of breath)    Palliative  care by specialist    Carcinoma of breast, estrogen receptor positive, stage 4, right (HCC) 11/16/2018   Hypoalbuminemia 11/16/2018   Pleural effusion 11/14/2018   Goals of care, counseling/discussion 11/06/2018   Malignant neoplasm of overlapping sites of right breast in female, estrogen receptor positive (HCC) 10/31/2018   Malignant 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  MEDICAL HISTORY: Past Medical History:  Diagnosis Date   Anemia    Cancer (HCC)    Family history of breast cancer    Family history of colon cancer    Family history of melanoma    Family history of prostate cancer    Gallstones    History of hiatal hernia    History of kidney stones    Mastitis    reports history of recurrent mastitis   Metastatic breast cancer    Obesity    Psoriasis     SURGICAL HISTORY: Past Surgical History:  Procedure Laterality Date   CYSTOSCOPY/URETEROSCOPY/HOLMIUM LASER/STENT PLACEMENT Left 10/17/2022   Procedure: CYSTOSCOPY, LEFT RETROGRADE PYELOGRAM, LEFT URETEROSCOPY, HOLMIUM LASER LITHOTRIPSY, BASKET EXTRACTION, AND LEFT URETERAL STENT PLACEMENT;  Surgeon: Adelbert Homans, MD;  Location: [facility];  Service: Urology;  Laterality: Left;  45 MINUTES   ESOPHAGOGASTRODUODENOSCOPY (EGD) WITH PROPOFOL  N/A 05/27/2022   Procedure: ESOPHAGOGASTRODUODENOSCOPY (EGD) WITH PROPOFOL ;  Surgeon: Evangeline Hilts, MD;  Location: [facility];  Service: Gastroenterology;  Laterality: N/A;   EYE SURGERY     cataract   GLAUCOMA SURGERY  late 1990's   IR THORACENTESIS ASP PLEURAL SPACE W/IMG GUIDE  10/28/2018   IR THORACENTESIS ASP  PLEURAL SPACE W/IMG GUIDE  10/29/2018   OPEN REDUCTION INTERNAL FIXATION (ORIF) DISTAL RADIAL FRACTURE Left 01/09/2019   Procedure: OPEN REDUCTION INTERNAL FIXATION (ORIF) HUMERAL FRACTURE;  Surgeon: Winston Hawking, MD;  Location: [facility];  Service: Orthopedics;  Laterality: Left;    SOCIAL HISTORY: Social History   Socioeconomic History   Marital status: Widowed    Spouse name: Not on file   Number of children: 1   Years of education: Not on file   Highest education level: Not on file  Occupational History   Occupation: Visual merchandiser Programs    Comment: Self-employed  Tobacco Use   Smoking status: Never   Smokeless tobacco: Never   Tobacco comments:    second hand smoke exposure  Vaping Use   Vaping status: Never Used  Substance and Sexual Activity   Alcohol use: No   Drug use: No   Sexual activity: Not Currently  Other Topics Concern   Not on file  Social History Narrative   Has a daughter named Bridgette Campus. Daughter has CP.   Social Drivers of Health   Financial Resource Strain: Low Risk  (09/17/2022)  Overall Financial Resource Strain (CARDIA)    Difficulty of Paying Living Expenses: Not hard at all  Food Insecurity: No Food Insecurity (09/17/2022)   Hunger Vital Sign    Worried About Running Out of Food in the Last Year: Never true    Ran Out of Food in the Last Year: Never true  Transportation Needs: No Transportation Needs (09/17/2022)   PRAPARE - Administrator, Civil Service (Medical): No    Lack of Transportation (Non-Medical): No  Physical Activity: Inactive (03/07/2023)   Exercise Vital Sign    Days of Exercise per Week: 0 days    Minutes of Exercise per Session: 0 min  Stress: Stress Concern Present (03/07/2023)   [employer] of Occupational Health - Occupational Stress Questionnaire    Feeling of Stress : To some extent  Social Connections: Socially Isolated (03/07/2023)   Social Connection and Isolation Panel [NHANES]    Frequency  of Communication with Friends and Family: Once a week    Frequency of Social Gatherings with Friends and Family: Once a week    Attends Religious Services: Never    Database administrator or Organizations: Yes    Attends Banker Meetings: Never    Marital Status: Widowed  Intimate Partner Violence: Not At Risk (09/17/2022)   Humiliation, Afraid, Rape, and Kick questionnaire    Fear of Current or Ex-Partner: No    Emotionally Abused: No    Physically Abused: No    Sexually Abused: No    FAMILY HISTORY: Family History  Problem Relation Age of Onset   Breast cancer Sister        in her 68's-70s   Heart disease Brother        CABG   Heart disease Brother        CABG   Skin cancer Brother        melanoma   Colon cancer Cousin    Heart attack Mother    Heart attack Father    Prostate cancer Brother     Review of Systems  Constitutional:  Positive for fatigue. Negative for appetite change, chills, fever and unexpected weight change.  HENT:   Negative for hearing loss, lump/mass and trouble swallowing.   Eyes:  Negative for eye problems and icterus.  Respiratory:  Negative for chest tightness, cough and shortness of breath.   Cardiovascular:  Negative for chest pain, leg swelling and palpitations.  Gastrointestinal:  Positive for nausea. Negative for abdominal distention, abdominal pain, constipation, diarrhea and vomiting.  Endocrine: Negative for hot flashes.  Genitourinary:  Negative for difficulty urinating.   Musculoskeletal:  Negative for arthralgias.  Skin:  Positive for rash. Negative for itching.  Neurological:  Negative for dizziness, extremity weakness, headaches and numbness.  Hematological:  Negative for adenopathy. Does not bruise/bleed easily.  Psychiatric/Behavioral:  Negative for depression. The patient is not nervous/anxious.       PHYSICAL EXAMINATION    Vitals:   10/02/23 1114  BP: 125/61  Pulse: (!) 119  Resp: 18  Temp: 98.2 F (36.8  C)  SpO2: 100%     She appears tired, in wheel chair, no distress Skin rash which was her psoriasis definitely appears lighter No LE edema.  LABORATORY DATA:  CBC    Component Value Date/Time   WBC 4.6 09/24/2023 1032   WBC 5.1 10/16/2022 1301   RBC 3.20 (L) 09/24/2023 1032   HGB 7.7 (L) 09/24/2023 1032   HCT 26.0 (L) 09/24/2023 1032  PLT 197 09/24/2023 1032   MCV 81.3 09/24/2023 1032   MCH 24.1 (L) 09/24/2023 1032   MCHC 29.6 (L) 09/24/2023 1032   RDW 20.1 (H) 09/24/2023 1032   LYMPHSABS 0.7 09/24/2023 1032   MONOABS 0.5 09/24/2023 1032   EOSABS 0.2 09/24/2023 1032   BASOSABS 0.1 09/24/2023 1032    CMP     Component Value Date/Time   NA 140 09/24/2023 1032   K 3.6 09/24/2023 1032   CL 101 09/24/2023 1032   CO2 31 09/24/2023 1032   GLUCOSE 104 (H) 09/24/2023 1032   BUN 15 09/24/2023 1032   CREATININE 1.03 (H) 09/24/2023 1032   CALCIUM  10.0 09/24/2023 1032   PROT 6.4 (L) 09/24/2023 1032   ALBUMIN  3.6 09/24/2023 1032   AST 37 09/24/2023 1032   ALT 22 09/24/2023 1032   ALKPHOS 186 (H) 09/24/2023 1032   BILITOT 0.7 09/24/2023 1032   GFRNONAA >60 09/24/2023 1032   GFRAA >60 02/24/2020 1454   GFRAA >60 07/24/2019 1453     ASSESSMENT and THERAPY PLAN:   Malignant neoplasm metastatic to bone [facility]) Assessment and Plan Assessment & Plan Metastatic breast cancer. Metastatic adenocarcinoma with prior treatments including Ibrance , Orserdu , Truqap , and Faslodex . Current Xeloda  treatment held due to rash, nausea, and fatigue.  Dose reduction to 1500 mg PO BID one week on one week off. - Consult dermatologist regarding rash and potential link to Xeloda  or Otezla . - Consider switching to Enhertu if toxicity persists.  Rash due to medication Rash developed after starting Xeloda , with hyperpigmentation.  Hyperpigmentation likely from Xeloda  This has improved some. Ok to continue monitoring.  Nausea due to chemotherapy Nausea associated with chemotherapy  treatment, particularly with Xeloda . - Continue anti-nausea medication 30 minutes before meals.  Iron deficiency anemia Iron deficiency anemia with low normal iron stores Continue oral iron and monitoring     All questions were answered. The patient knows to call the clinic with any problems, questions or concerns. We can certainly see the patient much sooner if necessary.  Total encounter time:30 minutes*in face-to-face visit time, chart review, lab review, care coordination, order entry, and documentation of the encounter time.    *Total Encounter Time as defined by the Centers for Medicare and Medicaid Services includes, in addition to the face-to-face time of a patient visit (documented in the note above) non-face-to-face time: obtaining and reviewing outside history, ordering and reviewing medications, tests or procedures, care coordination (communications with other health care professionals or caregivers) and documentation in the medical record.

## 2023-10-03 ENCOUNTER — Other Ambulatory Visit: Payer: Self-pay

## 2023-10-03 ENCOUNTER — Encounter: Payer: Self-pay | Admitting: Hematology and Oncology

## 2023-10-03 ENCOUNTER — Other Ambulatory Visit (HOSPITAL_COMMUNITY): Payer: Self-pay

## 2023-10-03 LAB — CANCER ANTIGEN 27.29: CA 27.29: 1009.8 U/mL — ABNORMAL HIGH (ref 0.0–38.6)

## 2023-10-03 NOTE — Progress Notes (Signed)
 Ben obtained approval for part B primary and Medicaid secondary. Will ship today instead as claim already billed, patient aware.

## 2023-10-03 NOTE — Telephone Encounter (Signed)
 Oral Oncology Patient Advocate Encounter  Prior Authorization for Capecitabine  has been approved.    PA# [id number]  Effective dates: 10/02/23 through 05/27/24  Patient may continue to fill with [facility] as they have been.    Hansel Ley, CPhT Pharmacy Technician Coordinator [facility] [phone] (Ph) 10/03/2023 7:46 AM

## 2023-10-04 ENCOUNTER — Other Ambulatory Visit: Payer: Self-pay | Admitting: *Deleted

## 2023-10-04 ENCOUNTER — Other Ambulatory Visit (HOSPITAL_COMMUNITY): Payer: Self-pay

## 2023-10-04 ENCOUNTER — Other Ambulatory Visit: Payer: Self-pay

## 2023-10-04 MED ORDER — SERTRALINE HCL 50 MG PO TABS
75.0000 mg | ORAL_TABLET | Freq: Every day | ORAL | 3 refills | Status: AC
  Filled 2023-10-04: qty 45, 30d supply, fill #0

## 2023-10-07 ENCOUNTER — Other Ambulatory Visit: Payer: Self-pay

## 2023-10-07 ENCOUNTER — Other Ambulatory Visit (HOSPITAL_COMMUNITY): Payer: Self-pay

## 2023-10-11 ENCOUNTER — Other Ambulatory Visit: Payer: Self-pay

## 2023-10-14 ENCOUNTER — Other Ambulatory Visit: Payer: Self-pay | Admitting: Physician Assistant

## 2023-10-14 DIAGNOSIS — C7951 Secondary malignant neoplasm of bone: Secondary | ICD-10-CM

## 2023-10-14 DIAGNOSIS — C7801 Secondary malignant neoplasm of right lung: Secondary | ICD-10-CM

## 2023-10-14 DIAGNOSIS — C50811 Malignant neoplasm of overlapping sites of right female breast: Secondary | ICD-10-CM

## 2023-10-15 ENCOUNTER — Other Ambulatory Visit (HOSPITAL_COMMUNITY): Payer: Self-pay

## 2023-10-15 ENCOUNTER — Other Ambulatory Visit: Payer: Self-pay | Admitting: Physician Assistant

## 2023-10-16 ENCOUNTER — Other Ambulatory Visit

## 2023-10-16 ENCOUNTER — Other Ambulatory Visit (HOSPITAL_COMMUNITY): Payer: Self-pay

## 2023-10-16 ENCOUNTER — Other Ambulatory Visit: Payer: Self-pay

## 2023-10-16 ENCOUNTER — Ambulatory Visit: Admitting: Physician Assistant

## 2023-10-16 NOTE — Progress Notes (Signed)
 Specialty Pharmacy Refill Coordination Note  Suzanne [name] is a 63 y.o. female contacted today regarding refills of specialty medication(s) Capecitabine  (XELODA )   Patient requested Delivery   Delivery date: 10/17/23   Verified address: [address]   [place] [address]   Medication will be filled on 10/16/23.

## 2023-10-17 ENCOUNTER — Other Ambulatory Visit: Payer: Self-pay

## 2023-10-22 ENCOUNTER — Other Ambulatory Visit

## 2023-10-22 ENCOUNTER — Ambulatory Visit: Admitting: Adult Health

## 2023-10-24 ENCOUNTER — Inpatient Hospital Stay

## 2023-10-30 ENCOUNTER — Telehealth: Payer: Self-pay

## 2023-10-30 ENCOUNTER — Other Ambulatory Visit: Payer: Self-pay

## 2023-10-30 NOTE — Telephone Encounter (Signed)
 Verbally confirmed appt for 6/5. May end up calling to reschedule because of the rain

## 2023-10-31 ENCOUNTER — Telehealth: Payer: Self-pay | Admitting: *Deleted

## 2023-10-31 ENCOUNTER — Inpatient Hospital Stay

## 2023-10-31 ENCOUNTER — Inpatient Hospital Stay: Attending: Adult Health | Admitting: Hematology and Oncology

## 2023-10-31 VITALS — BP 137/83 | HR 119 | Temp 98.1°F | Resp 18 | Wt 178.6 lb

## 2023-10-31 DIAGNOSIS — K5900 Constipation, unspecified: Secondary | ICD-10-CM | POA: Insufficient documentation

## 2023-10-31 DIAGNOSIS — E876 Hypokalemia: Secondary | ICD-10-CM | POA: Insufficient documentation

## 2023-10-31 DIAGNOSIS — C50819 Malignant neoplasm of overlapping sites of unspecified female breast: Secondary | ICD-10-CM

## 2023-10-31 DIAGNOSIS — R42 Dizziness and giddiness: Secondary | ICD-10-CM | POA: Insufficient documentation

## 2023-10-31 DIAGNOSIS — Z170 Estrogen receptor positive status [ER+]: Secondary | ICD-10-CM | POA: Diagnosis not present

## 2023-10-31 DIAGNOSIS — R5383 Other fatigue: Secondary | ICD-10-CM | POA: Diagnosis not present

## 2023-10-31 DIAGNOSIS — Z800 Family history of malignant neoplasm of digestive organs: Secondary | ICD-10-CM | POA: Insufficient documentation

## 2023-10-31 DIAGNOSIS — R630 Anorexia: Secondary | ICD-10-CM | POA: Diagnosis not present

## 2023-10-31 DIAGNOSIS — R110 Nausea: Secondary | ICD-10-CM | POA: Insufficient documentation

## 2023-10-31 DIAGNOSIS — C7951 Secondary malignant neoplasm of bone: Secondary | ICD-10-CM

## 2023-10-31 DIAGNOSIS — Z803 Family history of malignant neoplasm of breast: Secondary | ICD-10-CM | POA: Diagnosis not present

## 2023-10-31 DIAGNOSIS — C7801 Secondary malignant neoplasm of right lung: Secondary | ICD-10-CM

## 2023-10-31 DIAGNOSIS — Z808 Family history of malignant neoplasm of other organs or systems: Secondary | ICD-10-CM | POA: Diagnosis not present

## 2023-10-31 DIAGNOSIS — C50811 Malignant neoplasm of overlapping sites of right female breast: Secondary | ICD-10-CM | POA: Diagnosis present

## 2023-10-31 LAB — SAMPLE TO BLOOD BANK

## 2023-10-31 LAB — CBC WITH DIFFERENTIAL (CANCER CENTER ONLY)
Abs Immature Granulocytes: 0.03 10*3/uL (ref 0.00–0.07)
Basophils Absolute: 0 10*3/uL (ref 0.0–0.1)
Basophils Relative: 1 %
Eosinophils Absolute: 0.1 10*3/uL (ref 0.0–0.5)
Eosinophils Relative: 3 %
HCT: 29.8 % — ABNORMAL LOW (ref 36.0–46.0)
Hemoglobin: 9.3 g/dL — ABNORMAL LOW (ref 12.0–15.0)
Immature Granulocytes: 1 %
Lymphocytes Relative: 17 %
Lymphs Abs: 0.8 10*3/uL (ref 0.7–4.0)
MCH: 27.4 pg (ref 26.0–34.0)
MCHC: 31.2 g/dL (ref 30.0–36.0)
MCV: 87.9 fL (ref 80.0–100.0)
Monocytes Absolute: 0.3 10*3/uL (ref 0.1–1.0)
Monocytes Relative: 6 %
Neutro Abs: 3.5 10*3/uL (ref 1.7–7.7)
Neutrophils Relative %: 72 %
Platelet Count: 218 10*3/uL (ref 150–400)
RBC: 3.39 MIL/uL — ABNORMAL LOW (ref 3.87–5.11)
RDW: 23.4 % — ABNORMAL HIGH (ref 11.5–15.5)
WBC Count: 4.8 10*3/uL (ref 4.0–10.5)
nRBC: 0 % (ref 0.0–0.2)

## 2023-10-31 LAB — IRON AND IRON BINDING CAPACITY (CC-WL,HP ONLY)
Iron: 87 ug/dL (ref 28–170)
Saturation Ratios: 16 % (ref 10.4–31.8)
TIBC: 539 ug/dL — ABNORMAL HIGH (ref 250–450)
UIBC: 452 ug/dL — ABNORMAL HIGH (ref 148–442)

## 2023-10-31 LAB — CMP (CANCER CENTER ONLY)
ALT: 19 U/L (ref 0–44)
AST: 33 U/L (ref 15–41)
Albumin: 3.5 g/dL (ref 3.5–5.0)
Alkaline Phosphatase: 211 U/L — ABNORMAL HIGH (ref 38–126)
Anion gap: 6 (ref 5–15)
BUN: 17 mg/dL (ref 8–23)
CO2: 40 mmol/L — ABNORMAL HIGH (ref 22–32)
Calcium: 12.2 mg/dL — ABNORMAL HIGH (ref 8.9–10.3)
Chloride: 95 mmol/L — ABNORMAL LOW (ref 98–111)
Creatinine: 1.29 mg/dL — ABNORMAL HIGH (ref 0.44–1.00)
GFR, Estimated: 46 mL/min — ABNORMAL LOW (ref >60)
Glucose, Bld: 190 mg/dL — ABNORMAL HIGH (ref 70–99)
Potassium: 2.5 mmol/L — CL (ref 3.5–5.1)
Sodium: 141 mmol/L (ref 135–145)
Total Bilirubin: 1 mg/dL (ref 0.0–1.2)
Total Protein: 6.3 g/dL — ABNORMAL LOW (ref 6.5–8.1)

## 2023-10-31 LAB — FERRITIN: Ferritin: 57 ng/mL (ref 11–307)

## 2023-10-31 NOTE — Progress Notes (Signed)
 [facility] Cancer Follow up:    [name], NP [address] [place] [address]   DIAGNOSIS:  Cancer Staging  Carcinoma of breast, estrogen receptor positive, stage 4, right (HCC) Staging form: Breast, AJCC 8th Edition - Clinical: Stage IV (pM1) - Signed by Percival Brace, NP on 02/15/2023    SUMMARY OF ONCOLOGIC HISTORY: Oncology History  Malignant neoplasm metastatic to bone [facility])  Carcinoma of breast, estrogen receptor positive, stage 4, right (HCC)  11/16/2018 Initial Diagnosis   Carcinoma of breast, estrogen receptor positive, stage 4, right (HCC)   02/15/2023 Cancer Staging   Staging form: Breast, AJCC 8th Edition - Clinical: Stage IV (pM1) - Signed by Percival Brace, NP on 02/15/2023     CURRENT THERAPY: Oral Capecitabine  (on hold)  INTERVAL HISTORY:  Discussed the use of AI scribe software for clinical note transcription with the patient, who gave verbal consent to proceed.  History of Present Illness  Suzanne [name] is a 63 year old female with metastatic breast cancer who presents with fatigue and phlegm production.  She experiences significant fatigue, often sleeping 12 to 14 hours, particularly after nights of disrupted sleep due to coughing. Despite a dose reduction of Xeloda  to 1500 mg twice a day, she continues to feel unwell.  She produces clear phlegm both day and night, often when getting up to use the bathroom, which sometimes disrupts her sleep.  She experiences nausea without vomiting, accompanied by stomach pain and decreased appetite, leading to weight loss. She is eating only a quarter to half of her meals, which is less than recommended with her medication.  She reports constipation, using Miralax  once daily without relief, though she is passing gas.  She experiences dizziness, particularly when standing, requiring assistance to walk. This has been present since her diagnosis but has worsened  recently.  She notes a rash that is improving, with some areas still darker than others. She is also taking iron pills, and her oxygen  needs remain unchanged.   Patient Active Problem List   Diagnosis Date Noted   Iron deficiency anemia 09/22/2023   UGI bleed 05/27/2022   Acute prerenal azotemia 04/18/2022   Dehydration 04/18/2022   SIRS (systemic inflammatory response syndrome) (HCC) 04/18/2022   Allergic rhinitis 04/18/2022   Depression 04/18/2022   Anemia of chronic disease 04/18/2022   Bronchitis 04/18/2022   Acute bronchitis 04/17/2022   Intertrigo 01/10/2022   Bacterial vaginitis 01/10/2022   Sepsis due to cellulitis (HCC) 12/06/2021   B12 deficiency anemia 08/15/2020   Sensorineural hearing loss (SNHL) of both ears 08/07/2019   Intractable back pain 02/03/2019   Thoracic compression fracture, closed, initial encounter (HCC) 02/03/2019   Essential hypertension 02/03/2019   Genetic testing 01/27/2019   Acute pharyngitis    Bacteremia    FTT (failure to thrive) in adult    Fracture closed, humerus, shaft 01/09/2019   Pneumonia 12/25/2018   Acute respiratory disorder in immunocompromised patient (HCC) 12/25/2018   Family history of breast cancer    Family history of prostate cancer    Family history of melanoma    Family history of colon cancer    Pressure injury of skin 11/24/2018   Chronic respiratory failure with hypoxia and hypercapnia (HCC) 11/19/2018   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   Hypophosphatemia 11/19/2018   Hypocalcemia 11/19/2018   Aspiration pneumonia (HCC) 11/19/2018   SOB (shortness of breath)    Palliative care by specialist    Carcinoma  of breast, estrogen receptor positive, stage 4, right (HCC) 11/16/2018   Hypoalbuminemia 11/16/2018   Pleural effusion 11/14/2018   Goals of care, counseling/discussion 11/06/2018   Malignant neoplasm of overlapping sites of right breast in female, estrogen receptor positive (HCC) 10/31/2018   Malignant  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  MEDICAL HISTORY: Past Medical History:  Diagnosis Date   Anemia    Cancer (HCC)    Family history of breast cancer    Family history of colon cancer    Family history of melanoma    Family history of prostate cancer    Gallstones    History of hiatal hernia    History of kidney stones    Mastitis    reports history of recurrent mastitis   Metastatic breast cancer    Obesity    Psoriasis     SURGICAL HISTORY: Past Surgical History:  Procedure Laterality Date   CYSTOSCOPY/URETEROSCOPY/HOLMIUM LASER/STENT PLACEMENT Left 10/17/2022   Procedure: CYSTOSCOPY, LEFT RETROGRADE PYELOGRAM, LEFT URETEROSCOPY, HOLMIUM LASER LITHOTRIPSY, BASKET EXTRACTION, AND LEFT URETERAL STENT PLACEMENT;  Surgeon: Adelbert Homans, MD;  Location: [facility];  Service: Urology;  Laterality: Left;  45 MINUTES   ESOPHAGOGASTRODUODENOSCOPY (EGD) WITH PROPOFOL  N/A 05/27/2022   Procedure: ESOPHAGOGASTRODUODENOSCOPY (EGD) WITH PROPOFOL ;  Surgeon: Evangeline Hilts, MD;  Location: [facility];  Service: Gastroenterology;  Laterality: N/A;   EYE SURGERY     cataract   GLAUCOMA SURGERY  late 1990's   IR THORACENTESIS ASP PLEURAL SPACE W/IMG GUIDE  10/28/2018   IR THORACENTESIS ASP PLEURAL SPACE W/IMG GUIDE  10/29/2018   OPEN REDUCTION INTERNAL FIXATION (ORIF) DISTAL RADIAL FRACTURE Left 01/09/2019   Procedure: OPEN REDUCTION INTERNAL FIXATION (ORIF) HUMERAL FRACTURE;  Surgeon: Winston Hawking, MD;  Location: [facility];  Service: Orthopedics;  Laterality: Left;    SOCIAL HISTORY: Social History   Socioeconomic History    Marital status: Widowed    Spouse name: Not on file   Number of children: 1   Years of education: Not on file   Highest education level: Not on file  Occupational History   Occupation: Visual merchandiser Programs    Comment: Self-employed  Tobacco Use   Smoking status: Never   Smokeless tobacco: Never   Tobacco comments:    second hand smoke exposure  Vaping Use   Vaping status: Never Used  Substance and Sexual Activity   Alcohol use: No   Drug use: No   Sexual activity: Not Currently  Other Topics Concern   Not on file  Social History Narrative   Has a daughter named Bridgette Campus. Daughter has CP.   Social Drivers of Corporate investment banker Strain: Low Risk  (09/17/2022)   Overall Financial Resource Strain (CARDIA)    Difficulty of Paying Living Expenses: Not hard at all  Food Insecurity: No Food Insecurity (09/17/2022)   Hunger Vital Sign    Worried About Running Out of Food in the Last Year: Never true    Ran Out of Food in the Last Year: Never true  Transportation Needs: No Transportation Needs (09/17/2022)  PRAPARE - Administrator, Civil Service (Medical): No    Lack of Transportation (Non-Medical): No  Physical Activity: Inactive (03/07/2023)   Exercise Vital Sign    Days of Exercise per Week: 0 days    Minutes of Exercise per Session: 0 min  Stress: Stress Concern Present (03/07/2023)   [employer] of Occupational Health - Occupational Stress Questionnaire    Feeling of Stress : To some extent  Social Connections: Socially Isolated (03/07/2023)   Social Connection and Isolation Panel [NHANES]    Frequency of Communication with Friends and Family: Once a week    Frequency of Social Gatherings with Friends and Family: Once a week    Attends Religious Services: Never    Database administrator or Organizations: Yes    Attends Banker Meetings: Never    Marital Status: Widowed  Intimate Partner Violence: Not At Risk (09/17/2022)    Humiliation, Afraid, Rape, and Kick questionnaire    Fear of Current or Ex-Partner: No    Emotionally Abused: No    Physically Abused: No    Sexually Abused: No    FAMILY HISTORY: Family History  Problem Relation Age of Onset   Breast cancer Sister        in her 37's-70s   Heart disease Brother        CABG   Heart disease Brother        CABG   Skin cancer Brother        melanoma   Colon cancer Cousin    Heart attack Mother    Heart attack Father    Prostate cancer Brother     Review of Systems  Constitutional:  Positive for fatigue. Negative for appetite change, chills, fever and unexpected weight change.  HENT:   Negative for hearing loss, lump/mass and trouble swallowing.   Eyes:  Negative for eye problems and icterus.  Respiratory:  Negative for chest tightness, cough and shortness of breath.   Cardiovascular:  Negative for chest pain, leg swelling and palpitations.  Gastrointestinal:  Positive for nausea. Negative for abdominal distention, abdominal pain, constipation, diarrhea and vomiting.  Endocrine: Negative for hot flashes.  Genitourinary:  Negative for difficulty urinating.   Musculoskeletal:  Negative for arthralgias.  Skin:  Positive for rash. Negative for itching.  Neurological:  Negative for dizziness, extremity weakness, headaches and numbness.  Hematological:  Negative for adenopathy. Does not bruise/bleed easily.  Psychiatric/Behavioral:  Negative for depression. The patient is not nervous/anxious.       PHYSICAL EXAMINATION    Vitals:   10/31/23 1124 10/31/23 1125  BP: (!) 142/73 137/83  Pulse: (!) 119   Resp: 18   Temp: 98.1 F (36.7 C)   SpO2: 100%      She appears tired, in wheel chair, no distress No cervical lymphadenopathy CTA bilaterally, oxygen  dependent. Skin rash which was her psoriasis definitely appears lighter No LE edema.  LABORATORY DATA:  CBC    Component Value Date/Time   WBC 4.8 10/31/2023 1213   WBC 5.1  10/16/2022 1301   RBC 3.39 (L) 10/31/2023 1213   HGB 9.3 (L) 10/31/2023 1213   HCT 29.8 (L) 10/31/2023 1213   PLT 218 10/31/2023 1213   MCV 87.9 10/31/2023 1213   MCH 27.4 10/31/2023 1213   MCHC 31.2 10/31/2023 1213   RDW 23.4 (H) 10/31/2023 1213   LYMPHSABS 0.8 10/31/2023 1213   MONOABS 0.3 10/31/2023 1213   EOSABS 0.1 10/31/2023 1213   BASOSABS  0.0 10/31/2023 1213    CMP     Component Value Date/Time   NA 141 10/31/2023 1213   K 2.5 (LL) 10/31/2023 1213   CL 95 (L) 10/31/2023 1213   CO2 40 (H) 10/31/2023 1213   GLUCOSE 190 (H) 10/31/2023 1213   BUN 17 10/31/2023 1213   CREATININE 1.29 (H) 10/31/2023 1213   CALCIUM  12.2 (H) 10/31/2023 1213   PROT 6.3 (L) 10/31/2023 1213   ALBUMIN  3.5 10/31/2023 1213   AST 33 10/31/2023 1213   ALT 19 10/31/2023 1213   ALKPHOS 211 (H) 10/31/2023 1213   BILITOT 1.0 10/31/2023 1213   GFRNONAA 46 (L) 10/31/2023 1213   GFRAA >60 02/24/2020 1454   GFRAA >60 07/24/2019 1453     ASSESSMENT and THERAPY PLAN:   Malignant neoplasm metastatic to bone [facility]) Assessment and Plan Assessment & Plan Metastatic breast cancer Managed with reduced dose Xeloda . Significant fatigue, nausea, and side effects raise concerns about progression. Considering Enhertu due to HER2 overexpression. Goal: enhance quality of life without harsh treatments. - Order blood work and scans to assess status and progression. - Hold Xeloda  during current week off. - Consider reducing Xeloda  dose to two pills if symptoms persist. - Discuss potential switch to Enhertu if current treatment is ineffective. - Discuss goals of care and future treatment options.  Fatigue Severe fatigue likely related to treatment and disease progression. - No worsening anemia, rest of the labs pending  Nausea Persistent nausea possibly related to treatment, with associated stomach pain. - Use anti nausea medication PRN  Decreased appetite Likely related to treatment and nausea, leading to  weight loss. - Order blood work to assess contributing factors.  Constipation Inadequate response to once-daily Miralax , occasional gas passage. - Increase Miralax  to twice daily. - Consider lactulose or magnesium  citrate if Miralax  is ineffective.  Dizziness Intermittent dizziness exacerbated by standing, worsened recently. - Advise to avoid sudden movements and stand up slowly. - Order blood work to assess contributing factors.  Hypokalemia,  Use Kcl 20 meq TID times 3 for 5-7days.  Goals of Care Discussion about goals of care, emphasizing quality of life and avoiding harsh treatments. - Discuss and document goals of care and advanced directives. - Prepare for potential future decisions regarding treatment options.     All questions were answered. The patient knows to call the clinic with any problems, questions or concerns. We can certainly see the patient much sooner if necessary.  Total encounter time:30 minutes*in face-to-face visit time, chart review, lab review, care coordination, order entry, and documentation of the encounter time.    *Total Encounter Time as defined by the Centers for Medicare and Medicaid Services includes, in addition to the face-to-face time of a patient visit (documented in the note above) non-face-to-face time: obtaining and reviewing outside history, ordering and reviewing medications, tests or procedures, care coordination (communications with other health care professionals or caregivers) and documentation in the medical record.

## 2023-10-31 NOTE — Telephone Encounter (Signed)
 This RN spoke with pt who verified she has in her home potassium Chloride  10 mEq tablets.  She will take 2 tablets now and then 2 tablets before bed.  She will then take 2 tablets twice a day for 7 days and then decrease to 1 tab twice a day.  Of note she is on lasix  20 mg daily.  Noted elevation in calcium - with [name] stating she is on a calcium  supplement - which per this conversation she will hold.  CT's have been scheduled for next available which is on 11/08/2023.

## 2023-10-31 NOTE — Assessment & Plan Note (Signed)
 Assessment and Plan Assessment & Plan Metastatic breast cancer Managed with reduced dose Xeloda . Significant fatigue, nausea, and side effects raise concerns about progression. Considering Enhertu due to HER2 overexpression. Goal: enhance quality of life without harsh treatments. - Order blood work and scans to assess status and progression. - Hold Xeloda  during current week off. - Consider reducing Xeloda  dose to two pills if symptoms persist. - Discuss potential switch to Enhertu if current treatment is ineffective. - Discuss goals of care and future treatment options.  Fatigue Severe fatigue likely related to treatment and disease progression. - No worsening anemia, rest of the labs pending  Nausea Persistent nausea possibly related to treatment, with associated stomach pain. - Use anti nausea medication PRN  Decreased appetite Likely related to treatment and nausea, leading to weight loss. - Order blood work to assess contributing factors.  Constipation Inadequate response to once-daily Miralax , occasional gas passage. - Increase Miralax  to twice daily. - Consider lactulose or magnesium  citrate if Miralax  is ineffective.  Dizziness Intermittent dizziness exacerbated by standing, worsened recently. - Advise to avoid sudden movements and stand up slowly. - Order blood work to assess contributing factors.  Hypokalemia,  Use Kcl 20 meq TID times 3 for 5-7days.  Goals of Care Discussion about goals of care, emphasizing quality of life and avoiding harsh treatments. - Discuss and document goals of care and advanced directives. - Prepare for potential future decisions regarding treatment options.

## 2023-11-01 ENCOUNTER — Other Ambulatory Visit (HOSPITAL_COMMUNITY): Payer: Self-pay

## 2023-11-01 ENCOUNTER — Other Ambulatory Visit: Payer: Self-pay

## 2023-11-01 ENCOUNTER — Other Ambulatory Visit: Payer: Self-pay | Admitting: Hematology and Oncology

## 2023-11-01 LAB — CANCER ANTIGEN 27.29: CA 27.29: 794.7 U/mL — ABNORMAL HIGH (ref 0.0–38.6)

## 2023-11-01 MED ORDER — PANTOPRAZOLE SODIUM 40 MG PO TBEC
40.0000 mg | DELAYED_RELEASE_TABLET | Freq: Every day | ORAL | 1 refills | Status: DC
  Filled 2023-11-01: qty 90, 90d supply, fill #0

## 2023-11-01 NOTE — Progress Notes (Signed)
 Specialty Pharmacy Refill Coordination Note  MAFALDA MCGINNISS is a 63 y.o. female contacted today regarding refills of specialty medication(s) Capecitabine  (XELODA )   Patient requested Delivery   Delivery date: 11/04/23   Verified address: [address]   [place] [address]   Medication will be filled on 11/01/23.   Refill request sent reflecting dosage change that patient states: 3 am & 2 pm.

## 2023-11-02 ENCOUNTER — Other Ambulatory Visit (HOSPITAL_BASED_OUTPATIENT_CLINIC_OR_DEPARTMENT_OTHER): Payer: Self-pay

## 2023-11-02 ENCOUNTER — Other Ambulatory Visit (HOSPITAL_COMMUNITY): Payer: Self-pay

## 2023-11-04 ENCOUNTER — Other Ambulatory Visit: Payer: Self-pay

## 2023-11-04 ENCOUNTER — Other Ambulatory Visit: Payer: Self-pay | Admitting: Pharmacist

## 2023-11-04 MED ORDER — CAPECITABINE 500 MG PO TABS
ORAL_TABLET | ORAL | 4 refills | Status: DC
  Filled 2023-11-04: qty 42, 14d supply, fill #0

## 2023-11-04 MED ORDER — CAPECITABINE 500 MG PO TABS
ORAL_TABLET | ORAL | 4 refills | Status: DC
  Filled 2023-11-04: qty 35, 14d supply, fill #0
  Filled 2023-11-13 – 2023-11-14 (×2): qty 35, 14d supply, fill #1
  Filled 2023-12-02: qty 35, 14d supply, fill #2
  Filled 2023-12-10: qty 35, 14d supply, fill #3
  Filled 2023-12-26: qty 35, 14d supply, fill #4

## 2023-11-05 ENCOUNTER — Other Ambulatory Visit: Payer: Self-pay

## 2023-11-06 ENCOUNTER — Other Ambulatory Visit: Payer: Self-pay

## 2023-11-08 ENCOUNTER — Ambulatory Visit (HOSPITAL_COMMUNITY): Attending: Hematology and Oncology

## 2023-11-11 ENCOUNTER — Other Ambulatory Visit: Payer: Self-pay

## 2023-11-11 ENCOUNTER — Ambulatory Visit: Admitting: Dermatology

## 2023-11-13 ENCOUNTER — Other Ambulatory Visit: Payer: Self-pay

## 2023-11-14 ENCOUNTER — Other Ambulatory Visit: Payer: Self-pay | Admitting: Pharmacy Technician

## 2023-11-14 ENCOUNTER — Other Ambulatory Visit: Payer: Self-pay

## 2023-11-14 NOTE — Progress Notes (Signed)
 Specialty Pharmacy Refill Coordination Note  Suzanne [name] is a 63 y.o. female contacted today regarding refills of specialty medication(s) Capecitabine  (XELODA )   Patient requested Delivery   Delivery date: 11/19/23   Verified address: [address]  [place] [facility]   Medication will be filled on 11/18/23.

## 2023-11-18 ENCOUNTER — Other Ambulatory Visit: Payer: Self-pay

## 2023-11-20 ENCOUNTER — Other Ambulatory Visit: Payer: Self-pay

## 2023-11-20 ENCOUNTER — Telehealth: Payer: Self-pay

## 2023-11-20 NOTE — Telephone Encounter (Signed)
 Verbally confirmed appt for 6/26

## 2023-11-21 ENCOUNTER — Other Ambulatory Visit: Payer: Self-pay

## 2023-11-21 ENCOUNTER — Telehealth: Payer: Self-pay

## 2023-11-21 ENCOUNTER — Inpatient Hospital Stay

## 2023-11-21 ENCOUNTER — Inpatient Hospital Stay (HOSPITAL_BASED_OUTPATIENT_CLINIC_OR_DEPARTMENT_OTHER): Admitting: Hematology and Oncology

## 2023-11-21 ENCOUNTER — Other Ambulatory Visit (HOSPITAL_COMMUNITY): Payer: Self-pay

## 2023-11-21 VITALS — BP 156/83 | HR 101 | Temp 97.9°F | Resp 17 | Wt 175.3 lb

## 2023-11-21 DIAGNOSIS — C7951 Secondary malignant neoplasm of bone: Secondary | ICD-10-CM

## 2023-11-21 DIAGNOSIS — C50819 Malignant neoplasm of overlapping sites of unspecified female breast: Secondary | ICD-10-CM

## 2023-11-21 DIAGNOSIS — C50811 Malignant neoplasm of overlapping sites of right female breast: Secondary | ICD-10-CM | POA: Diagnosis not present

## 2023-11-21 LAB — CBC WITH DIFFERENTIAL/PLATELET
Abs Immature Granulocytes: 0.02 10*3/uL (ref 0.00–0.07)
Basophils Absolute: 0 10*3/uL (ref 0.0–0.1)
Basophils Relative: 1 %
Eosinophils Absolute: 0.2 10*3/uL (ref 0.0–0.5)
Eosinophils Relative: 2 %
HCT: 30.6 % — ABNORMAL LOW (ref 36.0–46.0)
Hemoglobin: 9.9 g/dL — ABNORMAL LOW (ref 12.0–15.0)
Immature Granulocytes: 0 %
Lymphocytes Relative: 13 %
Lymphs Abs: 0.9 10*3/uL (ref 0.7–4.0)
MCH: 29.6 pg (ref 26.0–34.0)
MCHC: 32.4 g/dL (ref 30.0–36.0)
MCV: 91.3 fL (ref 80.0–100.0)
Monocytes Absolute: 0.7 10*3/uL (ref 0.1–1.0)
Monocytes Relative: 10 %
Neutro Abs: 5.2 10*3/uL (ref 1.7–7.7)
Neutrophils Relative %: 74 %
Platelets: 189 10*3/uL (ref 150–400)
RBC: 3.35 MIL/uL — ABNORMAL LOW (ref 3.87–5.11)
RDW: 21.5 % — ABNORMAL HIGH (ref 11.5–15.5)
WBC: 7 10*3/uL (ref 4.0–10.5)
nRBC: 0 % (ref 0.0–0.2)

## 2023-11-21 LAB — CMP (CANCER CENTER ONLY)
ALT: 14 U/L (ref 0–44)
AST: 29 U/L (ref 15–41)
Albumin: 3.6 g/dL (ref 3.5–5.0)
Alkaline Phosphatase: 291 U/L — ABNORMAL HIGH (ref 38–126)
Anion gap: 8 (ref 5–15)
BUN: 18 mg/dL (ref 8–23)
CO2: 36 mmol/L — ABNORMAL HIGH (ref 22–32)
Calcium: 13 mg/dL — ABNORMAL HIGH (ref 8.9–10.3)
Chloride: 99 mmol/L (ref 98–111)
Creatinine: 1.88 mg/dL — ABNORMAL HIGH (ref 0.44–1.00)
GFR, Estimated: 29 mL/min — ABNORMAL LOW (ref >60)
Glucose, Bld: 100 mg/dL — ABNORMAL HIGH (ref 70–99)
Potassium: 3 mmol/L — ABNORMAL LOW (ref 3.5–5.1)
Sodium: 143 mmol/L (ref 135–145)
Total Bilirubin: 1.4 mg/dL — ABNORMAL HIGH (ref 0.0–1.2)
Total Protein: 6.3 g/dL — ABNORMAL LOW (ref 6.5–8.1)

## 2023-11-21 MED ORDER — ALBUTEROL SULFATE HFA 108 (90 BASE) MCG/ACT IN AERS
2.0000 | INHALATION_SPRAY | Freq: Four times a day (QID) | RESPIRATORY_TRACT | 2 refills | Status: AC | PRN
  Filled 2023-11-21: qty 26.8, 100d supply, fill #0

## 2023-11-21 NOTE — Assessment & Plan Note (Signed)
 Assessment and Plan Assessment & Plan Metastatic breast cancer Managed with reduced dose Xeloda . Assessment and Plan Assessment & Plan Metastatic breast cancer Managed with reduced dose Xeloda . Significant fatigue, nausea, and side effects raise concerns about progression. Considering Enhertu due to HER2 overexpression. Goal: enhance quality of life without harsh treatments. - Order blood work and scans to assess status and progression. - Hold Xeloda  during current week off. - Consider reducing Xeloda  dose to two pills if symptoms persist. - Discuss potential switch to Enhertu if current treatment is ineffective. - Discuss goals of care and future treatment options.  Fatigue Severe fatigue likely related to treatment and disease progression. - Order blood work to assess contributing factors. - Hold Xeloda  during current week off.  Nausea Persistent nausea possibly related to treatment, with associated stomach pain. - Order blood work to assess contributing factors.  Decreased appetite Likely related to treatment and nausea, leading to weight loss. - Order blood work to assess contributing factors.  Weight loss Unintentional weight loss due to decreased appetite and inadequate intake. - Order blood work to assess contributing factors.  Constipation Inadequate response to once-daily Miralax , occasional gas passage. - Increase Miralax  to twice daily. - Consider lactulose or magnesium  citrate if Miralax  is ineffective.  Dizziness Intermittent dizziness exacerbated by standing, worsened recently. - Advise to avoid sudden movements and stand up slowly. - Order blood work to assess contributing factors.  Phlegm production Intermittent clear phlegm possibly related to lung scarring from cancer.  Rash Improving rash with some areas still dark.  Goals of Care Discussion about goals of care, emphasizing quality of life and avoiding harsh treatments. - Discuss and document goals  of care and advanced directives. - Prepare for potential future decisions regarding treatment options.

## 2023-11-21 NOTE — Telephone Encounter (Signed)
 Attempted to call patient on primary number regarding today's appointment. No answer. Left voicemail providing patient with clinic number requesting callback.

## 2023-11-21 NOTE — Progress Notes (Signed)
 [facility] Cancer Follow up:    Suzanne [name] ORN, NP [address] [place] [address] [place] [address]   DIAGNOSIS:  Cancer Staging  Carcinoma of breast, estrogen receptor positive, stage 4, right (HCC) Staging form: Breast, AJCC 8th Edition - Clinical: Stage IV (pM1) - Signed by Suzanne Morna Pickle, NP on 02/15/2023    SUMMARY OF ONCOLOGIC HISTORY: Oncology History  Malignant neoplasm metastatic to bone [facility])  Carcinoma of breast, estrogen receptor positive, stage 4, right (HCC)  11/16/2018 Initial Diagnosis   Carcinoma of breast, estrogen receptor positive, stage 4, right (HCC)   02/15/2023 Cancer Staging   Staging form: Breast, AJCC 8th Edition - Clinical: Stage IV (pM1) - Signed by Suzanne Morna Pickle, NP on 02/15/2023     CURRENT THERAPY: Oral Capecitabine  (on hold) History of Present Illness  Suzanne [name] is a 63 year old female with metastatic breast cancer who presents with fatigue and phlegm production.  She is now on xeloda  1500 mg Q AM and 1000 mg Q PM one week on and one week off.  Discussed the use of AI scribe software for clinical note transcription with the patient, who gave verbal consent to proceed.  History of Present Illness     Suzanne [name] is a 63 year old female who presents for follow-up regarding medication side effects. She is accompanied by her caregiver.  The side effects from her current medication regimen have improved at this new dose. She experiences intermittent nausea a couple of times a week, which has decreased in frequency since starting the medication. Initially, nausea was a daily occurrence. She uses Zofran  for nausea relief, which she finds effective, and she takes it less than once a day. She also reports sleeping a lot, though it is unclear if this is related to the medication.  Her rash has improved. She is eating better, consuming small meals frequently, which helps manage her nausea.  She has a  persistent cough, which her aide noted sounds similar to an asthma attack. She was previously prescribed an inhaler, possibly albuterol , which she still uses occasionally. No wheezes or abnormal lung sounds noted.  No diarrhea.   Patient Active Problem List   Diagnosis Date Noted   Iron deficiency anemia 09/22/2023   UGI bleed 05/27/2022   Acute prerenal azotemia 04/18/2022   Dehydration 04/18/2022   SIRS (systemic inflammatory response syndrome) (HCC) 04/18/2022   Allergic rhinitis 04/18/2022   Depression 04/18/2022   Anemia of chronic disease 04/18/2022   Bronchitis 04/18/2022   Acute bronchitis 04/17/2022   Intertrigo 01/10/2022   Bacterial vaginitis 01/10/2022   Sepsis due to cellulitis (HCC) 12/06/2021   B12 deficiency anemia 08/15/2020   Sensorineural hearing loss (SNHL) of both ears 08/07/2019   Intractable back pain 02/03/2019   Thoracic compression fracture, closed, initial encounter (HCC) 02/03/2019   Essential hypertension 02/03/2019   Genetic testing 01/27/2019   Acute pharyngitis    Bacteremia    FTT (failure to thrive) in adult    Fracture closed, humerus, shaft 01/09/2019   Pneumonia 12/25/2018   Acute respiratory disorder in immunocompromised patient (HCC) 12/25/2018   Family history of breast cancer    Family history of prostate cancer    Family history of melanoma    Family history of colon cancer    Pressure injury of skin 11/24/2018   Chronic respiratory failure with hypoxia and hypercapnia (HCC) 11/19/2018   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   Hypophosphatemia 11/19/2018   Hypocalcemia  11/19/2018   Aspiration pneumonia (HCC) 11/19/2018   SOB (shortness of breath)    Palliative care by specialist    Carcinoma of breast, estrogen receptor positive, stage 4, right (HCC) 11/16/2018   Hypoalbuminemia 11/16/2018   Pleural effusion 11/14/2018   Goals of care, counseling/discussion 11/06/2018   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  MEDICAL HISTORY: Past Medical History:  Diagnosis Date   Anemia    Cancer (HCC)    Family history of breast cancer    Family history of colon cancer    Family history of melanoma    Family history of prostate cancer    Gallstones    History of hiatal hernia    History of kidney stones    Mastitis    reports history of recurrent mastitis   Metastatic breast cancer    Obesity    Psoriasis     SURGICAL HISTORY: Past Surgical History:  Procedure Laterality Date   CYSTOSCOPY/URETEROSCOPY/HOLMIUM LASER/STENT PLACEMENT Left 10/17/2022   Procedure: CYSTOSCOPY, LEFT RETROGRADE PYELOGRAM, LEFT URETEROSCOPY, HOLMIUM LASER LITHOTRIPSY, BASKET EXTRACTION, AND LEFT URETERAL STENT PLACEMENT;  Surgeon: Suzanne Lonni Righter, MD;  Location: [facility];  Service: Urology;  Laterality: Left;  45 MINUTES   ESOPHAGOGASTRODUODENOSCOPY (EGD) WITH PROPOFOL  N/A 05/27/2022   Procedure: ESOPHAGOGASTRODUODENOSCOPY (EGD) WITH PROPOFOL ;  Surgeon: Suzanne Fallow, MD;  Location: [facility];  Service: Gastroenterology;  Laterality: N/A;   EYE SURGERY     cataract   GLAUCOMA SURGERY  late 1990's   IR THORACENTESIS ASP PLEURAL SPACE W/IMG GUIDE  10/28/2018   IR THORACENTESIS ASP PLEURAL SPACE W/IMG GUIDE  10/29/2018   OPEN REDUCTION INTERNAL FIXATION (ORIF) DISTAL RADIAL FRACTURE Left 01/09/2019   Procedure: OPEN REDUCTION INTERNAL FIXATION (ORIF) HUMERAL FRACTURE;  Surgeon: Suzanne Kemps, MD;  Location: [facility];  Service: Orthopedics;  Laterality: Left;    SOCIAL HISTORY: Social History   Socioeconomic History   Marital status: Widowed    Spouse name: Not on  file   Number of children: 1   Years of education: Not on file   Highest education level: Not on file  Occupational History   Occupation: Visual merchandiser Programs    Comment: Self-employed  Tobacco Use   Smoking status: Never   Smokeless tobacco: Never   Tobacco comments:    second hand smoke exposure  Vaping Use   Vaping status: Never Used  Substance and Sexual Activity   Alcohol use: No   Drug use: No   Sexual activity: Not Currently  Other Topics Concern   Not on file  Social History Narrative   Has a daughter named [name]. Daughter has CP.   Social Drivers of Corporate investment banker Strain: Low Risk  (09/17/2022)   Overall Financial Resource Strain (CARDIA)    Difficulty of Paying Living Expenses: Not hard at all  Food Insecurity: No Food Insecurity (09/17/2022)   Hunger Vital Sign    Worried About Running Out of Food in the Last Year: Never true    Ran Out of Food in the Last Year: Never true  Transportation Needs: No Transportation Needs (09/17/2022)   PRAPARE - Administrator, Civil Service (Medical): No    Lack of Transportation (Non-Medical): No  Physical Activity: Inactive (03/07/2023)   Exercise Vital Sign    Days of Exercise per Week: 0 days    Minutes of Exercise per Session: 0 min  Stress: Stress Concern Present (03/07/2023)   [employer] of Occupational Health - Occupational Stress Questionnaire    Feeling of Stress : To some extent  Social Connections: Socially Isolated (03/07/2023)   Social Connection and Isolation Panel    Frequency of Communication with Friends and Family: Once a week    Frequency of Social Gatherings with Friends and Family: Once a week    Attends Religious Services: Never    Database administrator or Organizations: Yes    Attends Banker Meetings: Never    Marital Status: Widowed  Intimate Partner Violence: Not At Risk (09/17/2022)   Humiliation, Afraid, Rape, and Kick questionnaire    Fear  of Current or Ex-Partner: No    Emotionally Abused: No    Physically Abused: No    Sexually Abused: No    FAMILY HISTORY: Family History  Problem Relation Age of Onset   Breast cancer Sister        in her 60's-70s   Heart disease Brother        CABG   Heart disease Brother        CABG   Skin cancer Brother        melanoma   Colon cancer Cousin    Heart attack Mother    Heart attack Father    Prostate cancer Brother     Review of Systems  Constitutional:  Positive for fatigue. Negative for appetite change, chills, fever and unexpected weight change.  HENT:   Negative for hearing loss, lump/mass and trouble swallowing.   Eyes:  Negative for eye problems and icterus.  Respiratory:  Negative for chest tightness, cough and shortness of breath.   Cardiovascular:  Negative for chest pain, leg swelling and palpitations.  Gastrointestinal:  Positive for nausea. Negative for abdominal distention, abdominal pain, constipation, diarrhea and vomiting.  Endocrine: Negative for hot flashes.  Genitourinary:  Negative for difficulty urinating.   Musculoskeletal:  Negative for arthralgias.  Skin:  Positive for rash. Negative for itching.  Neurological:  Negative for dizziness, extremity weakness, headaches and numbness.  Hematological:  Negative for adenopathy. Does not bruise/bleed easily.  Psychiatric/Behavioral:  Negative for depression. The patient is not nervous/anxious.       PHYSICAL EXAMINATION    Vitals:   11/21/23 1523  BP: (!) 156/83  Pulse: (!) 101  Resp: 17  Temp: 97.9 F (36.6 C)  SpO2: 100%     She appears tired, in wheel chair, no distress No cervical lymphadenopathy CTA bilaterally, oxygen  dependent. Skin rash which was her psoriasis definitely appears lighter No LE edema.  LABORATORY DATA:  CBC    Component Value Date/Time   WBC 4.8 10/31/2023 1213   WBC 5.1 10/16/2022 1301   RBC 3.39 (L) 10/31/2023 1213   HGB 9.3 (L) 10/31/2023 1213   HCT 29.8  (L) 10/31/2023 1213   PLT 218 10/31/2023 1213   MCV 87.9 10/31/2023 1213   MCH 27.4 10/31/2023 1213   MCHC 31.2 10/31/2023 1213   RDW 23.4 (H) 10/31/2023 1213   LYMPHSABS 0.8 10/31/2023 1213   MONOABS 0.3 10/31/2023 1213   EOSABS 0.1 10/31/2023 1213   BASOSABS  0.0 10/31/2023 1213    CMP     Component Value Date/Time   NA 141 10/31/2023 1213   K 2.5 (LL) 10/31/2023 1213   CL 95 (L) 10/31/2023 1213   CO2 40 (H) 10/31/2023 1213   GLUCOSE 190 (H) 10/31/2023 1213   BUN 17 10/31/2023 1213   CREATININE 1.29 (H) 10/31/2023 1213   CALCIUM  12.2 (H) 10/31/2023 1213   PROT 6.3 (L) 10/31/2023 1213   ALBUMIN  3.5 10/31/2023 1213   AST 33 10/31/2023 1213   ALT 19 10/31/2023 1213   ALKPHOS 211 (H) 10/31/2023 1213   BILITOT 1.0 10/31/2023 1213   GFRNONAA 46 (L) 10/31/2023 1213   GFRAA >60 02/24/2020 1454   GFRAA >60 07/24/2019 1453     ASSESSMENT and THERAPY PLAN:   Assessment and Plan Assessment & Plan Metastatic breast cancer Current regimen of xeloda  well tolerated.  Rash improved with dermatological management, no diarrhea reported. - Continue current medication regimen  - Schedule scans in early July to monitor progress.  Nausea Intermittent nausea improved with Zofran  and dietary adjustments. - Continue Zofran  as needed for nausea. - Encourage small, frequent meals.  Asthma-like symptoms Persistent cough with asthma-like symptoms, no wheezing. Inhaler effective. - Prescribe albuterol  inhaler as needed. - Monitor symptoms, consider pulmonology referral if symptoms persist or worsen.  Hypercalcemia and AKI Will arrange for IVF tomorrow and repeat labs on Monday or Tuesday.  All questions were answered. The patient knows to call the clinic with any problems, questions or concerns. We can certainly see the patient much sooner if necessary.  Total encounter time:30 minutes*in face-to-face visit time, chart review, lab review, care coordination, order entry, and  documentation of the encounter time.

## 2023-11-22 ENCOUNTER — Telehealth: Payer: Self-pay

## 2023-11-22 ENCOUNTER — Inpatient Hospital Stay

## 2023-11-22 ENCOUNTER — Other Ambulatory Visit: Payer: Self-pay

## 2023-11-22 ENCOUNTER — Telehealth: Payer: Self-pay | Admitting: *Deleted

## 2023-11-22 VITALS — BP 153/73 | HR 73 | Temp 97.9°F | Resp 18

## 2023-11-22 DIAGNOSIS — E876 Hypokalemia: Secondary | ICD-10-CM

## 2023-11-22 DIAGNOSIS — C50811 Malignant neoplasm of overlapping sites of right female breast: Secondary | ICD-10-CM | POA: Diagnosis not present

## 2023-11-22 LAB — CANCER ANTIGEN 27.29: CA 27.29: 703.2 U/mL — ABNORMAL HIGH (ref 0.0–38.6)

## 2023-11-22 MED ORDER — SODIUM CHLORIDE 0.9 % IV SOLN: Freq: Once | INTRAVENOUS | Status: AC

## 2023-11-22 MED ORDER — POTASSIUM CHLORIDE CRYS ER 20 MEQ PO TBCR
40.0000 meq | EXTENDED_RELEASE_TABLET | Freq: Once | ORAL | Status: AC
  Administered 2023-11-22: 40 meq via ORAL
  Filled 2023-11-22: qty 2

## 2023-11-22 NOTE — Telephone Encounter (Signed)
 Attempted to call patient on primary number regarding today's appointment. No answer. Left voicemail providing patient with clinic number and requested callback. Also attempted to reach out to patient's daughter. No answer. LVM requesting call back.

## 2023-11-22 NOTE — Patient Instructions (Signed)
 Hypercalcemia Hypercalcemia is when the level of calcium in a person's blood is above normal. The body needs calcium to make bones and keep them strong. Calcium also helps the muscles, nerves, brain, and heart work the way they should. Most of the calcium in the body is stored in the bones. There is also calcium in the blood. Hypercalcemia occurs when there is too much calcium in your blood. Calcium levels in the blood are regulated by hormones, kidneys, and the gastrointestinal tract.  Hypercalcemia can happen when calcium comes out of the bones, or when the kidneys are not able to remove calcium from the blood. Hypercalcemia can be mild or severe. What are the causes? There are many possible causes of hypercalcemia. Common causes of this condition include: Hyperparathyroidism. This is a condition in which the body produces too much parathyroid hormone. There are four parathyroid glands in your neck. These glands produce a chemical messenger (hormone) that helps the body absorb calcium from foods and helps your bones release calcium. Certain kinds of cancer. Less common causes of hypercalcemia include: Calcium and vitamin D dietary supplements. Chronic kidney disease. Hyperthyroidism. Severe dehydration. Being on bed rest or being inactive for a long time. Certain medicines. Infections. What increases the risk? You are more likely to develop this condition if: You are female. You are 42 years of age or older. You have a family history of hypercalcemia. What are the signs or symptoms? Mild hypercalcemia that starts slowly may not cause symptoms. Severe, sudden hypercalcemia is more likely to cause symptoms, such as: Being more thirsty than usual. Needing to urinate more often than usual. Abdominal pain. Nausea and vomiting. Constipation. Muscle pain, twitching, or weakness. Feeling very tired. How is this diagnosed?  Hypercalcemia is usually diagnosed with a blood test. You may also  have tests to help check what is causing this condition. Tests include imaging tests and more blood tests. How is this treated? Treatment for hypercalcemia depends on the cause. Treatment may include: Receiving fluids through an IV. Medicines. These can be used to: Keep calcium levels steady after receiving fluids (loop diuretics). Keep calcium in your bones (bisphosphonates). Lower the calcium level in your blood. Surgery to remove overactive parathyroid glands. A procedure that filters your blood to correct calcium levels (hemodialysis). Follow these instructions at home:  Take over-the-counter and prescription medicines only as told by your health care provider. Follow instructions from your health care provider about eating or drinking restrictions. Drink enough fluid to keep your urine pale yellow. Stay active. Weight-bearing exercise helps to keep calcium in your bones. Follow instructions from your health care provider about what type and level of exercise is safe for you. Keep all follow-up visits. This is important. Contact a health care provider if: You have a fever. Your heartbeat is irregular or very fast. You have changes in mood, memory, or personality. Get help right away if: You have severe abdominal pain. You have chest pain. You have trouble breathing. You become very confused and sleepy. You lose consciousness. These symptoms may represent a serious problem that is an emergency. Do not wait to see if the symptoms will go away. Get medical help right away. Call your local emergency services (911 in the [place] Do not drive yourself to the hospital. Summary Hypercalcemia is when the level of calcium in a person's blood is above normal. The body needs calcium to make bones and keep them strong. There are many possible causes of hypercalcemia, and treatment depends on  the cause. Take over-the-counter and prescription medicines only as told by your health care  provider. This information is not intended to replace advice given to you by your health care provider. Make sure you discuss any questions you have with your health care provider. Document Revised: 10/19/2020 Document Reviewed: 10/19/2020 Elsevier Patient Education  2024 [employer].

## 2023-11-22 NOTE — Telephone Encounter (Signed)
 Pt left VM stating she had to cancer her CT last week due to not feeling well. This RN called [facility] and requested they call pt to reschedule appt.

## 2023-11-26 ENCOUNTER — Inpatient Hospital Stay: Attending: Adult Health

## 2023-11-26 ENCOUNTER — Other Ambulatory Visit: Payer: Self-pay | Admitting: Hematology and Oncology

## 2023-11-26 DIAGNOSIS — Z808 Family history of malignant neoplasm of other organs or systems: Secondary | ICD-10-CM | POA: Insufficient documentation

## 2023-11-26 DIAGNOSIS — Z170 Estrogen receptor positive status [ER+]: Secondary | ICD-10-CM | POA: Insufficient documentation

## 2023-11-26 DIAGNOSIS — D649 Anemia, unspecified: Secondary | ICD-10-CM | POA: Diagnosis not present

## 2023-11-26 DIAGNOSIS — C50811 Malignant neoplasm of overlapping sites of right female breast: Secondary | ICD-10-CM | POA: Insufficient documentation

## 2023-11-26 DIAGNOSIS — Z79899 Other long term (current) drug therapy: Secondary | ICD-10-CM | POA: Diagnosis not present

## 2023-11-26 DIAGNOSIS — Z803 Family history of malignant neoplasm of breast: Secondary | ICD-10-CM | POA: Diagnosis not present

## 2023-11-26 DIAGNOSIS — C7951 Secondary malignant neoplasm of bone: Secondary | ICD-10-CM | POA: Insufficient documentation

## 2023-11-26 DIAGNOSIS — C50819 Malignant neoplasm of overlapping sites of unspecified female breast: Secondary | ICD-10-CM

## 2023-11-26 DIAGNOSIS — R5383 Other fatigue: Secondary | ICD-10-CM | POA: Diagnosis not present

## 2023-11-26 DIAGNOSIS — Z800 Family history of malignant neoplasm of digestive organs: Secondary | ICD-10-CM | POA: Insufficient documentation

## 2023-11-26 DIAGNOSIS — R110 Nausea: Secondary | ICD-10-CM | POA: Insufficient documentation

## 2023-11-26 LAB — CMP (CANCER CENTER ONLY)
ALT: 17 U/L (ref 0–44)
AST: 30 U/L (ref 15–41)
Albumin: 3.5 g/dL (ref 3.5–5.0)
Alkaline Phosphatase: 264 U/L — ABNORMAL HIGH (ref 38–126)
Anion gap: 6 (ref 5–15)
BUN: 15 mg/dL (ref 8–23)
CO2: 33 mmol/L — ABNORMAL HIGH (ref 22–32)
Calcium: 13.1 mg/dL (ref 8.9–10.3)
Chloride: 104 mmol/L (ref 98–111)
Creatinine: 1.69 mg/dL — ABNORMAL HIGH (ref 0.44–1.00)
GFR, Estimated: 34 mL/min — ABNORMAL LOW
Glucose, Bld: 120 mg/dL — ABNORMAL HIGH (ref 70–99)
Potassium: 3.6 mmol/L (ref 3.5–5.1)
Sodium: 143 mmol/L (ref 135–145)
Total Bilirubin: 0.9 mg/dL (ref 0.0–1.2)
Total Protein: 6.3 g/dL — ABNORMAL LOW (ref 6.5–8.1)

## 2023-11-26 LAB — CBC WITH DIFFERENTIAL/PLATELET
Abs Immature Granulocytes: 0.01 K/uL (ref 0.00–0.07)
Basophils Absolute: 0 K/uL (ref 0.0–0.1)
Basophils Relative: 1 %
Eosinophils Absolute: 0.2 K/uL (ref 0.0–0.5)
Eosinophils Relative: 3 %
HCT: 29.8 % — ABNORMAL LOW (ref 36.0–46.0)
Hemoglobin: 9.6 g/dL — ABNORMAL LOW (ref 12.0–15.0)
Immature Granulocytes: 0 %
Lymphocytes Relative: 17 %
Lymphs Abs: 0.9 K/uL (ref 0.7–4.0)
MCH: 29.8 pg (ref 26.0–34.0)
MCHC: 32.2 g/dL (ref 30.0–36.0)
MCV: 92.5 fL (ref 80.0–100.0)
Monocytes Absolute: 0.4 K/uL (ref 0.1–1.0)
Monocytes Relative: 8 %
Neutro Abs: 4 K/uL (ref 1.7–7.7)
Neutrophils Relative %: 71 %
Platelets: 190 K/uL (ref 150–400)
RBC: 3.22 MIL/uL — ABNORMAL LOW (ref 3.87–5.11)
RDW: 20.4 % — ABNORMAL HIGH (ref 11.5–15.5)
WBC: 5.6 K/uL (ref 4.0–10.5)
nRBC: 0 % (ref 0.0–0.2)

## 2023-11-26 NOTE — Progress Notes (Signed)
 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 She understands the risk of osteonecrosis of the jaw. She has some transportation issues,so cannot come for IVF tomorrow, will come on Thursday In the mean time, she will drink plenty of fluids.  Trystan Eads

## 2023-11-27 ENCOUNTER — Other Ambulatory Visit: Payer: Self-pay | Admitting: *Deleted

## 2023-11-27 LAB — CANCER ANTIGEN 27.29: CA 27.29: 637.2 U/mL — ABNORMAL HIGH (ref 0.0–38.6)

## 2023-11-28 ENCOUNTER — Inpatient Hospital Stay

## 2023-11-28 DIAGNOSIS — D500 Iron deficiency anemia secondary to blood loss (chronic): Secondary | ICD-10-CM

## 2023-11-28 DIAGNOSIS — C7951 Secondary malignant neoplasm of bone: Secondary | ICD-10-CM | POA: Diagnosis not present

## 2023-11-28 MED ORDER — DENOSUMAB 120 MG/1.7ML ~~LOC~~ SOLN
120.0000 mg | Freq: Once | SUBCUTANEOUS | Status: AC
  Administered 2023-11-28: 120 mg via SUBCUTANEOUS
  Filled 2023-11-28: qty 1.7

## 2023-11-28 MED ORDER — SODIUM CHLORIDE 0.9 % IV SOLN: INTRAVENOUS | Status: AC

## 2023-11-28 NOTE — Patient Instructions (Signed)
 Hypercalcemia Hypercalcemia is when the level of calcium in a person's blood is above normal. The body needs calcium to make bones and keep them strong. Calcium also helps the muscles, nerves, brain, and heart work the way they should. Most of the calcium in the body is stored in the bones. There is also calcium in the blood. Hypercalcemia occurs when there is too much calcium in your blood. Calcium levels in the blood are regulated by hormones, kidneys, and the gastrointestinal tract.  Hypercalcemia can happen when calcium comes out of the bones, or when the kidneys are not able to remove calcium from the blood. Hypercalcemia can be mild or severe. What are the causes? There are many possible causes of hypercalcemia. Common causes of this condition include: Hyperparathyroidism. This is a condition in which the body produces too much parathyroid hormone. There are four parathyroid glands in your neck. These glands produce a chemical messenger (hormone) that helps the body absorb calcium from foods and helps your bones release calcium. Certain kinds of cancer. Less common causes of hypercalcemia include: Calcium and vitamin D dietary supplements. Chronic kidney disease. Hyperthyroidism. Severe dehydration. Being on bed rest or being inactive for a long time. Certain medicines. Infections. What increases the risk? You are more likely to develop this condition if: You are female. You are 65 years of age or older. You have a family history of hypercalcemia. What are the signs or symptoms? Mild hypercalcemia that starts slowly may not cause symptoms. Severe, sudden hypercalcemia is more likely to cause symptoms, such as: Being more thirsty than usual. Needing to urinate more often than usual. Abdominal pain. Nausea and vomiting. Constipation. Muscle pain, twitching, or weakness. Feeling very tired. How is this diagnosed?  Hypercalcemia is usually diagnosed with a blood test. You may also  have tests to help check what is causing this condition. Tests include imaging tests and more blood tests. How is this treated? Treatment for hypercalcemia depends on the cause. Treatment may include: Receiving fluids through an IV. Medicines. These can be used to: Keep calcium levels steady after receiving fluids (loop diuretics). Keep calcium in your bones (bisphosphonates). Lower the calcium level in your blood. Surgery to remove overactive parathyroid glands. A procedure that filters your blood to correct calcium levels (hemodialysis). Follow these instructions at home:  Take over-the-counter and prescription medicines only as told by your health care provider. Follow instructions from your health care provider about eating or drinking restrictions. Drink enough fluid to keep your urine pale yellow. Stay active. Weight-bearing exercise helps to keep calcium in your bones. Follow instructions from your health care provider about what type and level of exercise is safe for you. Keep all follow-up visits. This is important. Contact a health care provider if: You have a fever. Your heartbeat is irregular or very fast. You have changes in mood, memory, or personality. Get help right away if: You have severe abdominal pain. You have chest pain. You have trouble breathing. You become very confused and sleepy. You lose consciousness. These symptoms may represent a serious problem that is an emergency. Do not wait to see if the symptoms will go away. Get medical help right away. Call your local emergency services (911 in the [place] Do not drive yourself to the hospital. Summary Hypercalcemia is when the level of calcium in a person's blood is above normal. The body needs calcium to make bones and keep them strong. There are many possible causes of hypercalcemia, and treatment depends on  the cause. Take over-the-counter and prescription medicines only as told by your health care  provider. This information is not intended to replace advice given to you by your health care provider. Make sure you discuss any questions you have with your health care provider. Document Revised: 10/19/2020 Document Reviewed: 10/19/2020 Elsevier Patient Education  2024 [employer].

## 2023-12-02 ENCOUNTER — Other Ambulatory Visit: Payer: Self-pay

## 2023-12-02 NOTE — Progress Notes (Signed)
 Specialty Pharmacy Refill Coordination Note  Suzanne [name] is a 63 y.o. female contacted today regarding refills of specialty medication(s) Capecitabine  (XELODA )   Patient requested Delivery   Delivery date: 12/03/23   Verified address: [address]  [place]   Medication will be filled on 12/02/23.

## 2023-12-06 ENCOUNTER — Other Ambulatory Visit (HOSPITAL_COMMUNITY): Payer: Self-pay

## 2023-12-10 ENCOUNTER — Other Ambulatory Visit: Payer: Self-pay | Admitting: Pharmacy Technician

## 2023-12-10 ENCOUNTER — Other Ambulatory Visit: Payer: Self-pay

## 2023-12-10 NOTE — Progress Notes (Signed)
 Specialty Pharmacy Refill Coordination Note  Suzanne [name] is a 63 y.o. female contacted today regarding refills of specialty medication(s) Capecitabine  (XELODA )   Patient requested Delivery   Delivery date: 12/13/23   Verified address: [address] [place]   Medication will be filled on 12/12/23.

## 2023-12-12 ENCOUNTER — Other Ambulatory Visit: Payer: Self-pay

## 2023-12-18 ENCOUNTER — Inpatient Hospital Stay

## 2023-12-18 ENCOUNTER — Inpatient Hospital Stay (HOSPITAL_BASED_OUTPATIENT_CLINIC_OR_DEPARTMENT_OTHER): Admitting: Adult Health

## 2023-12-18 ENCOUNTER — Encounter: Payer: Self-pay | Admitting: Adult Health

## 2023-12-18 VITALS — BP 128/61 | HR 98 | Temp 98.4°F | Resp 17 | Ht 59.0 in | Wt 180.6 lb

## 2023-12-18 DIAGNOSIS — C7951 Secondary malignant neoplasm of bone: Secondary | ICD-10-CM

## 2023-12-18 DIAGNOSIS — C50819 Malignant neoplasm of overlapping sites of unspecified female breast: Secondary | ICD-10-CM

## 2023-12-18 LAB — CBC WITH DIFFERENTIAL/PLATELET
Abs Immature Granulocytes: 0.02 K/uL (ref 0.00–0.07)
Basophils Absolute: 0 K/uL (ref 0.0–0.1)
Basophils Relative: 1 %
Eosinophils Absolute: 0.2 K/uL (ref 0.0–0.5)
Eosinophils Relative: 4 %
HCT: 28.5 % — ABNORMAL LOW (ref 36.0–46.0)
Hemoglobin: 8.8 g/dL — ABNORMAL LOW (ref 12.0–15.0)
Immature Granulocytes: 1 %
Lymphocytes Relative: 14 %
Lymphs Abs: 0.6 K/uL — ABNORMAL LOW (ref 0.7–4.0)
MCH: 30.2 pg (ref 26.0–34.0)
MCHC: 30.9 g/dL (ref 30.0–36.0)
MCV: 97.9 fL (ref 80.0–100.0)
Monocytes Absolute: 0.4 K/uL (ref 0.1–1.0)
Monocytes Relative: 10 %
Neutro Abs: 3 K/uL (ref 1.7–7.7)
Neutrophils Relative %: 70 %
Platelets: 148 K/uL — ABNORMAL LOW (ref 150–400)
RBC: 2.91 MIL/uL — ABNORMAL LOW (ref 3.87–5.11)
RDW: 19.6 % — ABNORMAL HIGH (ref 11.5–15.5)
WBC: 4.3 K/uL (ref 4.0–10.5)
nRBC: 0 % (ref 0.0–0.2)

## 2023-12-18 LAB — CMP (CANCER CENTER ONLY)
ALT: 11 U/L (ref 0–44)
AST: 26 U/L (ref 15–41)
Albumin: 3.3 g/dL — ABNORMAL LOW (ref 3.5–5.0)
Alkaline Phosphatase: 253 U/L — ABNORMAL HIGH (ref 38–126)
Anion gap: 5 (ref 5–15)
BUN: 11 mg/dL (ref 8–23)
CO2: 30 mmol/L (ref 22–32)
Calcium: 8.3 mg/dL — ABNORMAL LOW (ref 8.9–10.3)
Chloride: 106 mmol/L (ref 98–111)
Creatinine: 0.9 mg/dL (ref 0.44–1.00)
GFR, Estimated: >60 mL/min (ref >60)
Glucose, Bld: 124 mg/dL — ABNORMAL HIGH (ref 70–99)
Potassium: 3.7 mmol/L (ref 3.5–5.1)
Sodium: 141 mmol/L (ref 135–145)
Total Bilirubin: 1.2 mg/dL (ref 0.0–1.2)
Total Protein: 6.1 g/dL — ABNORMAL LOW (ref 6.5–8.1)

## 2023-12-18 NOTE — Progress Notes (Signed)
 [facility] Cancer Follow up:    [name], NP [address] [place] [address] [place] [address]   DIAGNOSIS:  Cancer Staging  Carcinoma of breast, estrogen receptor positive, stage 4, right (HCC) Staging form: Breast, AJCC 8th Edition - Clinical: Stage IV (pM1) - Signed by Crawford Morna Pickle, NP on 02/15/2023    SUMMARY OF ONCOLOGIC HISTORY: Oncology History  Malignant neoplasm metastatic to bone [facility])  Carcinoma of breast, estrogen receptor positive, stage 4, right (HCC)  11/16/2018 Initial Diagnosis   Carcinoma of breast, estrogen receptor positive, stage 4, right (HCC)   02/15/2023 Cancer Staging   Staging form: Breast, AJCC 8th Edition - Clinical: Stage IV (pM1) - Signed by Crawford Morna Pickle, NP on 02/15/2023     CURRENT THERAPY: Xeloda   INTERVAL HISTORY:  Discussed the use of AI scribe software for clinical note transcription with the patient, who gave verbal consent to proceed.  History of Present Illness Suzanne [name] is a 63 year old female with metastatic breast cancer and  hypercalcemia of malignancy who presents for follow-up regarding her calcium  levels and medication management.  She experiences occasional nausea and some persistent fatigue, though both have improved. No constipation is present. Her calcium  levels were previously high, leading to the cessation of calcium  supplements and administration of an Xgeva  injection. She is on Xeloda , 1500 mg in the morning and 1000 mg in the evening, following a one-week-on, one-week-off schedule. This dosage has been adjusted multiple times.  She has encountered difficulties scheduling a CT scan but plans to call again. Her last Xgeva  injection was on July 3rd. Her water intake has normalized post-Xgeva  injection, and the calcium  content (minimal as additive in the [place] value brand of water) in her bottled water is not a concern. Kidney function has normalized after previously being high  with elevated calcium  levels.  She had two episodes of rapid breathing at night, which resolved spontaneously and have not recurred. No issues with her hands and feet are reported.     Patient Active Problem List   Diagnosis Date Noted   Hypercalcemia of malignancy 11/26/2023   Iron deficiency anemia 09/22/2023   UGI bleed 05/27/2022   Acute prerenal azotemia 04/18/2022   Dehydration 04/18/2022   SIRS (systemic inflammatory response syndrome) (HCC) 04/18/2022   Allergic rhinitis 04/18/2022   Depression 04/18/2022   Anemia of chronic disease 04/18/2022   Bronchitis 04/18/2022   Acute bronchitis 04/17/2022   Intertrigo 01/10/2022   Bacterial vaginitis 01/10/2022   Sepsis due to cellulitis (HCC) 12/06/2021   B12 deficiency anemia 08/15/2020   Sensorineural hearing loss (SNHL) of both ears 08/07/2019   Intractable back pain 02/03/2019   Thoracic compression fracture, closed, initial encounter (HCC) 02/03/2019   Essential hypertension 02/03/2019   Genetic testing 01/27/2019   Acute pharyngitis    Bacteremia    FTT (failure to thrive) in adult    Fracture closed, humerus, shaft 01/09/2019   Pneumonia 12/25/2018   Acute respiratory disorder in immunocompromised patient (HCC) 12/25/2018   Family history of breast cancer    Family history of prostate cancer    Family history of melanoma    Family history of colon cancer    Pressure injury of skin 11/24/2018   Chronic respiratory failure with hypoxia and hypercapnia (HCC) 11/19/2018   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   Hypophosphatemia 11/19/2018   Hypocalcemia 11/19/2018   Aspiration pneumonia (HCC) 11/19/2018   SOB (shortness of breath)    Palliative care  by specialist    Carcinoma of breast, estrogen receptor positive, stage 4, right (HCC) 11/16/2018   Hypoalbuminemia 11/16/2018   Pleural effusion 11/14/2018   Goals of care, counseling/discussion 11/06/2018   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  MEDICAL HISTORY: Past Medical History:  Diagnosis Date   Anemia    Cancer (HCC)    Family history of breast cancer    Family history of colon cancer    Family history of melanoma    Family history of prostate cancer    Gallstones    History of hiatal hernia    History of kidney stones    Mastitis    reports history of recurrent mastitis   Metastatic breast cancer    Obesity    Psoriasis     SURGICAL HISTORY: Past Surgical History:  Procedure Laterality Date   CYSTOSCOPY/URETEROSCOPY/HOLMIUM LASER/STENT PLACEMENT Left 10/17/2022   Procedure: CYSTOSCOPY, LEFT RETROGRADE PYELOGRAM, LEFT URETEROSCOPY, HOLMIUM LASER LITHOTRIPSY, BASKET EXTRACTION, AND LEFT URETERAL STENT PLACEMENT;  Surgeon: Devere Lonni Righter, MD;  Location: [facility];  Service: Urology;  Laterality: Left;  45 MINUTES   ESOPHAGOGASTRODUODENOSCOPY (EGD) WITH PROPOFOL  N/A 05/27/2022   Procedure: ESOPHAGOGASTRODUODENOSCOPY (EGD) WITH PROPOFOL ;  Surgeon: Burnette Fallow, MD;  Location: [facility];  Service: Gastroenterology;  Laterality: N/A;   EYE SURGERY     cataract   GLAUCOMA SURGERY  late 1990's   IR THORACENTESIS ASP PLEURAL SPACE W/IMG GUIDE  10/28/2018   IR THORACENTESIS ASP PLEURAL SPACE W/IMG GUIDE  10/29/2018   OPEN REDUCTION INTERNAL FIXATION (ORIF) DISTAL RADIAL FRACTURE Left 01/09/2019   Procedure: OPEN REDUCTION INTERNAL FIXATION (ORIF) HUMERAL FRACTURE;  Surgeon: Kay Kemps, MD;  Location: [facility];  Service: Orthopedics;  Laterality: Left;    SOCIAL HISTORY: Social History   Socioeconomic History   Marital status: Widowed    Spouse name: Not on  file   Number of children: 1   Years of education: Not on file   Highest education level: Not on file  Occupational History   Occupation: Visual merchandiser Programs    Comment: Self-employed  Tobacco Use   Smoking status: Never   Smokeless tobacco: Never   Tobacco comments:    second hand smoke exposure  Vaping Use   Vaping status: Never Used  Substance and Sexual Activity   Alcohol use: No   Drug use: No   Sexual activity: Not Currently  Other Topics Concern   Not on file  Social History Narrative   Has a daughter named [name]. Daughter has CP.   Social Drivers of Corporate investment banker Strain: Low Risk  (09/17/2022)   Overall Financial Resource Strain (CARDIA)    Difficulty of Paying Living Expenses: Not hard at all  Food Insecurity: No Food Insecurity (09/17/2022)   Hunger Vital Sign    Worried About Running Out of Food in the Last Year: Never true    Ran Out of Food in the Last Year: Never true  Transportation Needs: No Transportation Needs (09/17/2022)   PRAPARE - Transportation    Lack of Transportation (  Medical): No    Lack of Transportation (Non-Medical): No  Physical Activity: Inactive (03/07/2023)   Exercise Vital Sign    Days of Exercise per Week: 0 days    Minutes of Exercise per Session: 0 min  Stress: Stress Concern Present (03/07/2023)   [employer] of Occupational Health - Occupational Stress Questionnaire    Feeling of Stress : To some extent  Social Connections: Socially Isolated (03/07/2023)   Social Connection and Isolation Panel    Frequency of Communication with Friends and Family: Once a week    Frequency of Social Gatherings with Friends and Family: Once a week    Attends Religious Services: Never    Database administrator or Organizations: Yes    Attends Banker Meetings: Never    Marital Status: Widowed  Intimate Partner Violence: Not At Risk (09/17/2022)   Humiliation, Afraid, Rape, and Kick questionnaire    Fear  of Current or Ex-Partner: No    Emotionally Abused: No    Physically Abused: No    Sexually Abused: No    FAMILY HISTORY: Family History  Problem Relation Age of Onset   Breast cancer Sister        in her 32's-70s   Heart disease Brother        CABG   Heart disease Brother        CABG   Skin cancer Brother        melanoma   Colon cancer Cousin    Heart attack Mother    Heart attack Father    Prostate cancer Brother     Review of Systems  Constitutional:  Positive for fatigue. Negative for appetite change, chills, fever and unexpected weight change.  HENT:   Negative for hearing loss, lump/mass and trouble swallowing.   Eyes:  Negative for eye problems and icterus.  Respiratory:  Negative for chest tightness, cough and shortness of breath.   Cardiovascular:  Negative for chest pain, leg swelling and palpitations.  Gastrointestinal:  Negative for abdominal distention, abdominal pain, constipation, diarrhea, nausea and vomiting.  Endocrine: Negative for hot flashes.  Genitourinary:  Negative for difficulty urinating.   Musculoskeletal:  Negative for arthralgias.  Skin:  Negative for itching and rash.  Neurological:  Negative for dizziness, extremity weakness, headaches and numbness.  Hematological:  Negative for adenopathy. Does not bruise/bleed easily.  Psychiatric/Behavioral:  Negative for depression. The patient is not nervous/anxious.       PHYSICAL EXAMINATION   Onc Performance Status - 12/18/23 0914       ECOG Perf Status   ECOG Perf Status Ambulatory and capable of all selfcare but unable to carry out any work activities.  Up and about more than 50% of waking hours      KPS SCALE   KPS % SCORE Requires occasional assistance but is able to care for most needs          Vitals:   12/18/23 0903  BP: 128/61  Pulse: 98  Resp: 17  Temp: 98.4 F (36.9 C)  SpO2: 100%    Physical Exam Constitutional:      General: She is not in acute distress.     Appearance: Normal appearance. She is not toxic-appearing.     Comments: Examined in wheelchair  HENT:     Head: Normocephalic and atraumatic.     Mouth/Throat:     Mouth: Mucous membranes are moist.     Pharynx: Oropharynx is clear. No oropharyngeal exudate or posterior oropharyngeal  erythema.  Eyes:     General: No scleral icterus. Cardiovascular:     Rate and Rhythm: Normal rate and regular rhythm.     Pulses: Normal pulses.     Heart sounds: Normal heart sounds.  Pulmonary:     Effort: Pulmonary effort is normal.     Breath sounds: Normal breath sounds.  Abdominal:     General: Abdomen is flat. Bowel sounds are normal. There is no distension.     Palpations: Abdomen is soft.     Tenderness: There is no abdominal tenderness.  Musculoskeletal:        General: No swelling.     Cervical back: Neck supple.  Lymphadenopathy:     Cervical: No cervical adenopathy.  Skin:    General: Skin is warm and dry.     Findings: No rash.  Neurological:     General: No focal deficit present.     Mental Status: She is alert.  Psychiatric:        Mood and Affect: Mood normal.        Behavior: Behavior normal.     LABORATORY DATA:  CBC    Component Value Date/Time   WBC 4.3 12/18/2023 0856   RBC 2.91 (L) 12/18/2023 0856   HGB 8.8 (L) 12/18/2023 0856   HGB 9.3 (L) 10/31/2023 1213   HCT 28.5 (L) 12/18/2023 0856   PLT 148 (L) 12/18/2023 0856   PLT 218 10/31/2023 1213   MCV 97.9 12/18/2023 0856   MCH 30.2 12/18/2023 0856   MCHC 30.9 12/18/2023 0856   RDW 19.6 (H) 12/18/2023 0856   LYMPHSABS 0.6 (L) 12/18/2023 0856   MONOABS 0.4 12/18/2023 0856   EOSABS 0.2 12/18/2023 0856   BASOSABS 0.0 12/18/2023 0856    CMP     Component Value Date/Time   NA 141 12/18/2023 0856   K 3.7 12/18/2023 0856   CL 106 12/18/2023 0856   CO2 30 12/18/2023 0856   GLUCOSE 124 (H) 12/18/2023 0856   BUN 11 12/18/2023 0856   CREATININE 0.90 12/18/2023 0856   CALCIUM  8.3 (L) 12/18/2023 0856   PROT  6.1 (L) 12/18/2023 0856   ALBUMIN  3.3 (L) 12/18/2023 0856   AST 26 12/18/2023 0856   ALT 11 12/18/2023 0856   ALKPHOS 253 (H) 12/18/2023 0856   BILITOT 1.2 12/18/2023 0856   GFRNONAA >60 12/18/2023 0856   GFRAA >60 02/24/2020 1454   GFRAA >60 07/24/2019 1453     ASSESSMENT and THERAPY PLAN:   Assessment and Plan Assessment & Plan Breast cancer with bone metastasis Managed with Xeloda , showing favorable response with positive tumor marker trends. Awaiting CT scan for disease monitoring. - Continue Xeloda  regimen. - Schedule CT scan to monitor disease progression.  Hypercalcemia Calcium  level improved to 8.3 after holding supplements and Xgeva  administration. - Hold calcium  supplements. - Monitor calcium  levels. - Return in 4 weeks for labs and f/u, will determine if Xgeva  is needed at that time.  It should be given at least every 12 weeks for her bone metastases  Anemia Chronic mild anemia with hemoglobin at 8.8, consistent with baseline. Platelet count slightly below normal but not concerning. - Monitor hemoglobin and platelet levels.  Fatigue Intermittent fatigue improved, likely related to cancer treatment and anemia. - Encourage adequate hydration.  Nausea Intermittent nausea, common side effect of treatment, not severe.  Breathing irregularities Occasional rapid breathing at night, possibly anxiety or cardiac-related, self-limiting. - Use pulse oximeter and blood pressure monitor during episodes to assess heart  rate and oxygen  levels.  Report findings to us .    RTC in 4 weeks for labs, f/u, and injection.     All questions were answered. The patient knows to call the clinic with any problems, questions or concerns. We can certainly see the patient much sooner if necessary.  Total encounter time:30 minutes*in face-to-face visit time, chart review, lab review, care coordination, order entry, and documentation of the encounter time.    Morna Kendall, NP 12/18/23  10:08 AM Medical Oncology and Hematology [facility] [address] [place], [place] [address] Tel. [phone]    Fax. [phone]  *Total Encounter Time as defined by the Centers for Medicare and Medicaid Services includes, in addition to the face-to-face time of a patient visit (documented in the note above) non-face-to-face time: obtaining and reviewing outside history, ordering and reviewing medications, tests or procedures, care coordination (communications with other health care professionals or caregivers) and documentation in the medical record.

## 2023-12-19 LAB — CANCER ANTIGEN 27.29: CA 27.29: 435.7 U/mL — ABNORMAL HIGH (ref 0.0–38.6)

## 2023-12-25 ENCOUNTER — Other Ambulatory Visit: Payer: Self-pay

## 2023-12-26 ENCOUNTER — Other Ambulatory Visit: Payer: Self-pay | Admitting: Pharmacy Technician

## 2023-12-26 ENCOUNTER — Other Ambulatory Visit: Payer: Self-pay

## 2023-12-26 ENCOUNTER — Ambulatory Visit (HOSPITAL_COMMUNITY)
Admission: RE | Admit: 2023-12-26 | Discharge: 2023-12-26 | Disposition: A | Discharge status: Home / Self Care | Source: Ambulatory Visit | Attending: Hematology and Oncology | Admitting: Hematology and Oncology

## 2023-12-26 DIAGNOSIS — K922 Gastrointestinal hemorrhage, unspecified: Secondary | ICD-10-CM | POA: Diagnosis not present

## 2023-12-26 DIAGNOSIS — C7951 Secondary malignant neoplasm of bone: Secondary | ICD-10-CM | POA: Insufficient documentation

## 2023-12-26 DIAGNOSIS — K254 Chronic or unspecified gastric ulcer with hemorrhage: Secondary | ICD-10-CM | POA: Diagnosis not present

## 2023-12-26 MED ORDER — IOHEXOL 300 MG/ML  SOLN
100.0000 mL | Freq: Once | INTRAMUSCULAR | Status: AC | PRN
  Administered 2023-12-26: 100 mL via INTRAVENOUS

## 2023-12-26 NOTE — Progress Notes (Signed)
 Specialty Pharmacy Refill Coordination Note  Suzanne [name] is a 63 y.o. female contacted today regarding refills of specialty medication(s) Capecitabine  (XELODA )   Patient requested Delivery   Delivery date: 12/27/23   Verified address: [address]  [place] [address]   Medication will be filled on 12/26/23.

## 2023-12-28 ENCOUNTER — Inpatient Hospital Stay (HOSPITAL_COMMUNITY)
Admission: EM | Admit: 2023-12-28 | Discharge: 2023-12-31 | DRG: 378 | Disposition: A | Discharge status: Home / Self Care | Attending: Family Medicine | Admitting: Family Medicine

## 2023-12-28 ENCOUNTER — Encounter: Payer: Self-pay | Admitting: Hematology and Oncology

## 2023-12-28 ENCOUNTER — Emergency Department (HOSPITAL_COMMUNITY)

## 2023-12-28 ENCOUNTER — Other Ambulatory Visit: Payer: Self-pay

## 2023-12-28 ENCOUNTER — Encounter (HOSPITAL_COMMUNITY): Payer: Self-pay | Admitting: Emergency Medicine

## 2023-12-28 DIAGNOSIS — C787 Secondary malignant neoplasm of liver and intrahepatic bile duct: Secondary | ICD-10-CM | POA: Diagnosis present

## 2023-12-28 DIAGNOSIS — K254 Chronic or unspecified gastric ulcer with hemorrhage: Principal | ICD-10-CM | POA: Diagnosis present

## 2023-12-28 DIAGNOSIS — Z808 Family history of malignant neoplasm of other organs or systems: Secondary | ICD-10-CM | POA: Diagnosis not present

## 2023-12-28 DIAGNOSIS — D638 Anemia in other chronic diseases classified elsewhere: Secondary | ICD-10-CM | POA: Diagnosis present

## 2023-12-28 DIAGNOSIS — Z79899 Other long term (current) drug therapy: Secondary | ICD-10-CM

## 2023-12-28 DIAGNOSIS — K449 Diaphragmatic hernia without obstruction or gangrene: Secondary | ICD-10-CM | POA: Diagnosis present

## 2023-12-28 DIAGNOSIS — C7951 Secondary malignant neoplasm of bone: Secondary | ICD-10-CM | POA: Diagnosis present

## 2023-12-28 DIAGNOSIS — Z803 Family history of malignant neoplasm of breast: Secondary | ICD-10-CM | POA: Diagnosis not present

## 2023-12-28 DIAGNOSIS — F32A Depression, unspecified: Secondary | ICD-10-CM | POA: Diagnosis present

## 2023-12-28 DIAGNOSIS — K8020 Calculus of gallbladder without cholecystitis without obstruction: Secondary | ICD-10-CM | POA: Diagnosis present

## 2023-12-28 DIAGNOSIS — D62 Acute posthemorrhagic anemia: Secondary | ICD-10-CM | POA: Diagnosis not present

## 2023-12-28 DIAGNOSIS — Z8249 Family history of ischemic heart disease and other diseases of the circulatory system: Secondary | ICD-10-CM

## 2023-12-28 DIAGNOSIS — D649 Anemia, unspecified: Secondary | ICD-10-CM

## 2023-12-28 DIAGNOSIS — I10 Essential (primary) hypertension: Secondary | ICD-10-CM | POA: Diagnosis present

## 2023-12-28 DIAGNOSIS — Z888 Allergy status to other drugs, medicaments and biological substances status: Secondary | ICD-10-CM

## 2023-12-28 DIAGNOSIS — Z6839 Body mass index (BMI) 39.0-39.9, adult: Secondary | ICD-10-CM

## 2023-12-28 DIAGNOSIS — I700 Atherosclerosis of aorta: Secondary | ICD-10-CM | POA: Diagnosis present

## 2023-12-28 DIAGNOSIS — Z800 Family history of malignant neoplasm of digestive organs: Secondary | ICD-10-CM | POA: Diagnosis not present

## 2023-12-28 DIAGNOSIS — Z8042 Family history of malignant neoplasm of prostate: Secondary | ICD-10-CM | POA: Diagnosis not present

## 2023-12-28 DIAGNOSIS — Z7722 Contact with and (suspected) exposure to environmental tobacco smoke (acute) (chronic): Secondary | ICD-10-CM | POA: Diagnosis present

## 2023-12-28 DIAGNOSIS — K920 Hematemesis: Secondary | ICD-10-CM | POA: Diagnosis not present

## 2023-12-28 DIAGNOSIS — K922 Gastrointestinal hemorrhage, unspecified: Secondary | ICD-10-CM | POA: Diagnosis present

## 2023-12-28 DIAGNOSIS — E876 Hypokalemia: Secondary | ICD-10-CM | POA: Diagnosis not present

## 2023-12-28 DIAGNOSIS — E66813 Obesity, class 3: Secondary | ICD-10-CM | POA: Diagnosis present

## 2023-12-28 DIAGNOSIS — Z79620 Long term (current) use of immunosuppressive biologic: Secondary | ICD-10-CM | POA: Diagnosis not present

## 2023-12-28 DIAGNOSIS — Z853 Personal history of malignant neoplasm of breast: Secondary | ICD-10-CM

## 2023-12-28 DIAGNOSIS — Z87442 Personal history of urinary calculi: Secondary | ICD-10-CM

## 2023-12-28 LAB — CBC WITH DIFFERENTIAL/PLATELET
Abs Immature Granulocytes: 0.07 K/uL (ref 0.00–0.07)
Basophils Absolute: 0 K/uL (ref 0.0–0.1)
Basophils Relative: 1 %
Eosinophils Absolute: 0.1 K/uL (ref 0.0–0.5)
Eosinophils Relative: 2 %
HCT: 24.1 % — ABNORMAL LOW (ref 36.0–46.0)
Hemoglobin: 7 g/dL — ABNORMAL LOW (ref 12.0–15.0)
Immature Granulocytes: 1 %
Lymphocytes Relative: 13 %
Lymphs Abs: 0.8 K/uL (ref 0.7–4.0)
MCH: 29.9 pg (ref 26.0–34.0)
MCHC: 29 g/dL — ABNORMAL LOW (ref 30.0–36.0)
MCV: 103 fL — ABNORMAL HIGH (ref 80.0–100.0)
Monocytes Absolute: 0.5 K/uL (ref 0.1–1.0)
Monocytes Relative: 9 %
Neutro Abs: 4.6 K/uL (ref 1.7–7.7)
Neutrophils Relative %: 74 %
Platelets: 175 K/uL (ref 150–400)
RBC: 2.34 MIL/uL — ABNORMAL LOW (ref 3.87–5.11)
RDW: 19.6 % — ABNORMAL HIGH (ref 11.5–15.5)
WBC: 6 K/uL (ref 4.0–10.5)
nRBC: 0.5 % — ABNORMAL HIGH (ref 0.0–0.2)

## 2023-12-28 LAB — COMPREHENSIVE METABOLIC PANEL WITH GFR
ALT: 11 U/L (ref 0–44)
AST: 24 U/L (ref 15–41)
Albumin: 2.6 g/dL — ABNORMAL LOW (ref 3.5–5.0)
Alkaline Phosphatase: 191 U/L — ABNORMAL HIGH (ref 38–126)
Anion gap: 10 (ref 5–15)
BUN: 28 mg/dL — ABNORMAL HIGH (ref 8–23)
CO2: 24 mmol/L (ref 22–32)
Calcium: 7.8 mg/dL — ABNORMAL LOW (ref 8.9–10.3)
Chloride: 107 mmol/L (ref 98–111)
Creatinine, Ser: 0.66 mg/dL (ref 0.44–1.00)
GFR, Estimated: >60 mL/min (ref >60)
Glucose, Bld: 124 mg/dL — ABNORMAL HIGH (ref 70–99)
Potassium: 3.5 mmol/L (ref 3.5–5.1)
Sodium: 141 mmol/L (ref 135–145)
Total Bilirubin: 1 mg/dL (ref 0.0–1.2)
Total Protein: 5.8 g/dL — ABNORMAL LOW (ref 6.5–8.1)

## 2023-12-28 LAB — PREPARE RBC (CROSSMATCH)

## 2023-12-28 LAB — PROTIME-INR
INR: 1.1 (ref 0.8–1.2)
Prothrombin Time: 14.7 s (ref 11.4–15.2)

## 2023-12-28 LAB — LIPASE, BLOOD: Lipase: 61 U/L — ABNORMAL HIGH (ref 11–51)

## 2023-12-28 MED ORDER — ONDANSETRON HCL 4 MG/2ML IJ SOLN
4.0000 mg | Freq: Once | INTRAMUSCULAR | Status: AC
  Administered 2023-12-28: 4 mg via INTRAVENOUS
  Filled 2023-12-28: qty 2

## 2023-12-28 MED ORDER — SODIUM CHLORIDE 0.9% IV SOLUTION: Freq: Once | INTRAVENOUS | Status: DC

## 2023-12-28 MED ORDER — LACTATED RINGERS IV BOLUS
1000.0000 mL | Freq: Once | INTRAVENOUS | Status: AC
  Administered 2023-12-28: 1000 mL via INTRAVENOUS

## 2023-12-28 MED ORDER — PANTOPRAZOLE SODIUM 40 MG IV SOLR
40.0000 mg | Freq: Once | INTRAVENOUS | Status: AC
  Administered 2023-12-28: 40 mg via INTRAVENOUS
  Filled 2023-12-28: qty 10

## 2023-12-28 MED ORDER — PANTOPRAZOLE SODIUM 40 MG IV SOLR
40.0000 mg | Freq: Two times a day (BID) | INTRAVENOUS | Status: DC
  Administered 2023-12-29 – 2023-12-31 (×5): 40 mg via INTRAVENOUS
  Filled 2023-12-28 (×5): qty 10

## 2023-12-28 MED ORDER — IOHEXOL 300 MG/ML  SOLN
100.0000 mL | Freq: Once | INTRAMUSCULAR | Status: AC | PRN
  Administered 2023-12-28: 100 mL via INTRAVENOUS

## 2023-12-28 NOTE — ED Provider Notes (Signed)
 [facility] AT [facility] Provider Note   CSN: [id number] Arrival date & time: 12/28/23  1616     History {Add pertinent medical, surgical, social history, OB history to HPI:1} Chief Complaint  Patient presents with  . Emesis  . GI Problem    Suzanne [name] is a 63 y.o. female with PMH as listed below who presents from home due to coffee ground emesis today. She reports nausea and abdominal pain, located in epigastric region which started overnight last night, rated 4/10 now which is somewhat better than it was. Has a known history of a gastric ulcer per patient, follows w/ Dr. Burnette. Tried pantoprazole  this morning which didn't help. Denies bloody stool. EMS administered 250 ml of fluid.    HX Breast cancer   EMS vitals: 134/72 BP 114 HR  99% SPO2 on 1.5 L O 2 (chronic) 135 CBG.    Past Medical History:  Diagnosis Date  . Anemia   . Cancer (HCC)   . Family history of breast cancer   . Family history of colon cancer   . Family history of melanoma   . Family history of prostate cancer   . Gallstones   . History of hiatal hernia   . History of kidney stones   . Mastitis    reports history of recurrent mastitis  . Metastatic breast cancer   . Obesity   . Psoriasis        Home Medications Prior to Admission medications   Medication Sig Start Date End Date Taking? Authorizing Provider  acetaminophen  (TYLENOL ) 500 MG tablet Take 1,000 mg by mouth every 6 (six) hours as needed for mild pain (pain score 1-3).    [provider]  albuterol  (VENTOLIN  HFA) 108 (90 Base) MCG/ACT inhaler Inhale 2 puffs into the lungs every 6 (six) hours as needed for wheezing or shortness of breath. 11/21/23   Loretha Ash, MD  Apremilast  (OTEZLA ) 30 MG TABS Take 1 tablet (30 mg total) by mouth 2 (two) times daily. 01/14/23   Alm [name] SAILOR, DO  capecitabine  (XELODA ) 500 MG tablet Take 3 tabs (1500 mg) by mouth every AM and 2 tablets by mouth in PM  Take for 7 days, followed by a 7 day rest period. Repeat every 14 days. Take within 30 minutes of a meal. 11/04/23   Iruku, Praveena, MD  clobetasol  (TEMOVATE ) 0.05 % external solution Apply 1 Application topically 2 (two) times daily. 01/22/23   Alm [name] SAILOR, DO  Cyanocobalamin  (B-12) 1000 MCG TABS Take 1 tablet (1,000 mcg total) by mouth daily. 05/27/23   Iruku, Praveena, MD  Denosumab  (XGEVA  [place]) Inject 1 Dose into the skin every 30 (thirty) days.    [provider]  furosemide  (LASIX ) 20 MG tablet Take 1 tablet (20 mg total) by mouth daily. 12/24/22   Crawford Morna Pickle, NP  hydrOXYzine  (ATARAX ) 10 MG tablet Take 1-2 tablets (10-20 mg total) by mouth 3 (three) times daily as needed. 08/14/22   Fleming, Zelda W, NP  ipratropium-albuterol  (DUONEB) 0.5-2.5 (3) MG/3ML SOLN Take 3 mLs by nebulization every 6 (six) hours as needed. 05/03/22   Iruku, Praveena, MD  loperamide  (IMODIUM ) 2 MG capsule Take 2 mg by mouth daily as needed for diarrhea or loose stools.    [provider]  ondansetron  (ZOFRAN -ODT) 8 MG disintegrating tablet Dissolve 1 tablet (8 mg total) by mouth every 8 (eight) hours as needed for nausea. 09/09/23   Crawford Morna Pickle, NP  OYSTER SHELL CALCIUM  PLUS D 500-5 MG-MCG TABS TAKE ONE TABLET BY MOUTH EVERY MORNING and TAKE ONE TABLET BY MOUTH AT NOON and TAKE ONE TABLET BY MOUTH EVERY EVENING 04/27/21   [name], Sandria BROCKS, MD  pantoprazole  (PROTONIX ) 40 MG tablet Take 1 tablet (40 mg total) by mouth daily. 11/01/23     potassium chloride  (MICRO-K ) 10 MEQ CR capsule Take 1 capsule (10 mEq total) by mouth daily. 12/24/22   Crawford Morna Pickle, NP  sertraline  (ZOLOFT ) 50 MG tablet Take 1.5 tablets (75 mg total) by mouth daily. Take 75 mg by mouth daily 10/04/23   Iruku, Praveena, MD  tacrolimus  (PROTOPIC ) 0.1 % ointment Apply topically 2 (two) times daily. Apply to affected areas of psoriasis on the days not using triamcinolone . 09/03/22   Alm [name] SAILOR, DO   traMADol  (ULTRAM ) 50 MG tablet Take 1 tablet (50 mg total) by mouth every 6 (six) hours as needed for moderate pain (pain score 4-6) or severe pain (pain score 7-10). 06/25/23   Causey, Morna Pickle, NP  triamcinolone  ointment (KENALOG ) 0.1 % Apply 1 Application topically 2 (two) times daily to affected areas of psoriasis for 2 weeks, then take 2 week break before restarting. 01/14/23   Alm [name] SAILOR, DO      Allergies    Ferumoxytol     Review of Systems   Review of Systems A 10 point review of systems was performed and is negative unless otherwise reported in HPI.  Physical Exam Updated Vital Signs BP (!) 146/88 (BP Location: Right Arm)   Pulse (!) 117   Temp 98.4 F (36.9 C) (Oral)   Resp (!) 28   LMP 05/28/2013 (Within Months)   SpO2 100%  Physical Exam General: Normal appearing female, lying in bed.  HEENT: PERRLA, Sclera anicteric, MMM, trachea midline.  Cardiology: Regular tachycardic rate, no murmurs/rubs/gallops Resp: Normal respiratory rate and effort. CTAB, no wheezes, rhonchi, crackles.  Abd: Soft, non-tender, non-distended. No rebound tenderness or guarding.  GU: Deferred. MSK: No peripheral edema or signs of trauma. Extremities without deformity or TTP. No cyanosis or clubbing. Skin: warm, dry. Back: No CVA tenderness Neuro: A&Ox4, CNs II-XII grossly intact. MAEs. Sensation grossly intact.  Psych: Normal mood and affect.   ED Results / Procedures / Treatments   Labs (all labs ordered are listed, but only abnormal results are displayed) Labs Reviewed - No data to display  EKG None  Radiology No results found.  Procedures Procedures  {Document cardiac monitor, telemetry assessment procedure when appropriate:1}  Medications Ordered in ED Medications - No data to display  ED Course/ Medical Decision Making/ A&P                          Medical Decision Making Amount and/or Complexity of Data Reviewed Labs: ordered. Decision-making details  documented in ED Course. Radiology: ordered. Decision-making details documented in ED Course.  Risk Prescription drug management. Decision regarding hospitalization.    This patient presents to the ED for concern of abd pain, N/V, coffee ground emesis, this involves an extensive number of treatment options, and is a complaint that carries with it a high risk of complications and morbidity.  I considered the following differential and admission for this acute, potentially life threatening condition.   MDM:    For DDX for abdominal pain includes but is not limited to:  Abdominal exam without peritoneal signs. No evidence of acute abdomen at this time. Low suspicion for acute hepatobiliary  disease (including acute cholecystitis or cholangitis), acute pancreatitis (neg lipase), PUD (including gastric perforation), acute infectious processes (pneumonia, hepatitis, pyelonephritis), acute appendicitis, vascular catastrophe, bowel obstruction, viscus perforation, or diverticulitis.  DDx UGIB: PUD (consider H pylori vs NSAID use vs other diet etiologies), arteriovenous malformations (AVMs) vs Dieulafoy lesions vs Mallory-Weiss tears vs malignant lesions vs Boerhaave's. Based on presentation, lower suspicion for Boerhaave's based on clinical exam findings and history.     Clinical Course as of 01/01/24 1443  Sat Dec 28, 2023  1812 Hemoglobin(!): 7.0 +drop from 8.8 just ten days ago.  Ordering 1 unit PRBCs.  Already received Protonix  IV and will obtain fecal occult as well. [HN]  2059 CT ABDOMEN PELVIS W CONTRAST 1. No CT evidence for acute intra-abdominal or pelvic abnormality. Distended gallbladder with multiple stones but similar appearance compared with prior exams and no right upper quadrant inflammatory process. 2. Numerous hypodense liver lesions corresponding to history of metastatic disease. Lesions appear slightly less conspicuous compared to February exam, generally smaller compared  with December 2024 study. 3. 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 4. Moderate hiatal hernia. 5. Aortic atherosclerosis.   [HN]  2205 D/w GI who recommends NPO after midnight for scope tomorrow. Patient receiving blood transfusion now.  [HN]  2205 No further emesis while in the ED. [HN]    Clinical Course User Index [HN] Franklyn Sid SAILOR, MD    Labs: I Ordered, and personally interpreted labs.  The pertinent results include:  those listed above  Imaging Studies ordered: I ordered imaging studies including CT abd pelvis w contrast I independently visualized and interpreted imaging. I agree with the radiologist interpretation  Additional history obtained from chart review.   Cardiac Monitoring: .The patient was maintained on a cardiac monitor.  I personally viewed and interpreted the cardiac monitored which showed an underlying rhythm of: sinus tachycardia  Reevaluation: After the interventions noted above, I reevaluated the patient and found that they have :improved  Social Determinants of Health: .Lives independently  Disposition:  ***  Co morbidities that complicate the patient evaluation . Past Medical History:  Diagnosis Date  . Anemia   . Cancer (HCC)   . Family history of breast cancer   . Family history of colon cancer   . Family history of melanoma   . Family history of prostate cancer   . Gallstones   . History of hiatal hernia   . History of kidney stones   . Mastitis    reports history of recurrent mastitis  . Metastatic breast cancer   . Obesity   . Psoriasis      Medicines No orders of the defined types were placed in this encounter.   I have reviewed the patients home medicines and have made adjustments as needed  Problem List / ED Course: Problem List Items Addressed This Visit   None        {Document critical care time when appropriate:1} {Document review of labs and clinical  decision tools ie heart score, Chads2Vasc2 etc:1}  {Document your independent review of radiology images, and any outside records:1} {Document your discussion with family members, caretakers, and with consultants:1} {Document social determinants of health affecting pt's care:1} {Document your decision making why or why not admission, treatments were needed:1}  This note was created using dictation software, which may contain spelling or grammatical errors.

## 2023-12-28 NOTE — Assessment & Plan Note (Signed)
 Chronic stable followed by oncology

## 2023-12-28 NOTE — H&P (Signed)
 Suzanne [name] FMW:[id number] DOB: Aug 31, 1960 DOA: 12/28/2023     PCP: [name], NP   Outpatient Specialists:     Oncology   Dr. Loretha Darlyn ee Dr. Burnette  Patient arrived to ER on 12/28/23 at 1616 Referred by Attending Franklyn Sid SAILOR, MD   Patient coming from:    home Lives With family     Chief Complaint:   Chief Complaint  Patient presents with   Emesis   GI Problem    HPI: Suzanne [name] is a 63 y.o. female with medical history significant of metastatic breast cancer and  hypercalcemia of malignancy, iron deficiency anemia, upper GI bleed depression bronchitis hypertension pleural effusions microcytic anemia    Presented with   coffee-ground emesis Coffee ground emesis since this AM, no melena At baseline Hg at 9 now down to 7 CT abd showed gall stones and known liver mets Hem occult neg Hx of metastatic breast cancer Spoke to [facility]  Started on Protonix  and getting transfused    Denies any black stool no blood in stools Not on aspirin  She has known hx of PUD  Lat Gi bleed was 6 m ago   Last blood transfusion was about 6 m ago too  Denies significant ETOH intake   Does not smoke      Regarding pertinent Chronic problems:     obesity-   BMI Readings from Last 1 Encounters:  12/18/23 36.48 kg/m      Chronic anemia - baseline hg Hemoglobin & Hematocrit  Recent Labs    11/26/23 1553 12/18/23 0856 12/28/23 1701  HGB 9.6* 8.8* 7.0*   Iron/TIBC/Ferritin/ %Sat    Component Value Date/Time   IRON 87 10/31/2023 1213   TIBC 539 (H) 10/31/2023 1213   FERRITIN 57 10/31/2023 1214   IRONPCTSAT 16 10/31/2023 1213     Cancer: Malignant breast cancer followed by oncology    While in ER: Clinical Course as of 12/28/23 2206  Sat Dec 28, 2023  1812 Hemoglobin(!): 7.0 +drop from 8.8 just ten days ago.  Ordering 1 unit PRBCs.  Already received Protonix  IV and will obtain fecal occult as well. [HN]  2059 CT ABDOMEN PELVIS W CONTRAST 1. No CT  evidence for acute intra-abdominal or pelvic abnormality. Distended gallbladder with multiple stones but similar appearance compared with prior exams and no right upper quadrant inflammatory process. 2. Numerous hypodense liver lesions corresponding to history of metastatic disease. Lesions appear slightly less conspicuous compared to February exam, generally smaller compared with December 2024 study. 3. 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 4. Moderate hiatal hernia. 5. Aortic atherosclerosis.   [HN]  2205 D/w GI who recommends NPO after midnight for scope tomorrow. Patient receiving blood transfusion now.  [HN]  2205 No further emesis while in the ED. [HN]    Clinical Course User Index [HN] Franklyn Sid SAILOR, MD         Lab Orders         CBC with Differential         Comprehensive metabolic panel         Protime-INR         Occult bld gastric/duodenum (cup to lab)         Lipase, blood         POC occult blood, ED       CTabd/pelvis -  No CT evidence for acute intra-abdominal or pelvic abnormality. Distended  gallbladder with multiple stones but similar appearance compared with prior exams and no right upper quadrant inflammatory process. 2. Numerous hypodense liver lesions corresponding to history of metastatic disease. Lesions appear slightly less conspicuous compared to February exam, generally smaller compared with December 2024 study. 3. 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 4. Moderate hiatal hernia. 5. Aortic atherosclerosis   Following Medications were ordered in ER: Medications  0.9 %  sodium chloride  infusion (Manually program via Guardrails IV Fluids) (has no administration in time range)  ondansetron  (ZOFRAN ) injection 4 mg (4 mg Intravenous Given 12/28/23 1702)  pantoprazole  (PROTONIX ) injection 40 mg (40 mg Intravenous Given 12/28/23 1701)  lactated  ringers  bolus 1,000 mL (1,000 mLs Intravenous New Bag/Given 12/28/23 1702)  iohexol  (OMNIPAQUE ) 300 MG/ML solution 100 mL (100 mLs Intravenous Contrast Given 12/28/23 1938)    _______________________________________________________ ER Provider Called:       Margarete Berke They Recommend admit to medicine   Will see in AM        ED Triage Vitals [12/28/23 1625]  Encounter Vitals Group     BP (!) 146/88     Girls Systolic BP Percentile      Girls Diastolic BP Percentile      Boys Systolic BP Percentile      Boys Diastolic BP Percentile      Pulse Rate (!) 117     Resp (!) 28     Temp 98.4 F (36.9 C)     Temp Source Oral     SpO2 100 %     Weight      Height      Head Circumference      Peak Flow      Pain Score 4     Pain Loc      Pain Education      Exclude from Growth Chart   TMAX(24)@     _________________________________________ Significant initial  Findings: Abnormal Labs Reviewed  CBC WITH DIFFERENTIAL/PLATELET - Abnormal; Notable for the following components:      Result Value   RBC 2.34 (*)    Hemoglobin 7.0 (*)    HCT 24.1 (*)    MCV 103.0 (*)    MCHC 29.0 (*)    RDW 19.6 (*)    nRBC 0.5 (*)    All other components within normal limits  COMPREHENSIVE METABOLIC PANEL WITH GFR - Abnormal; Notable for the following components:   Glucose, Bld 124 (*)    BUN 28 (*)    Calcium  7.8 (*)    Total Protein 5.8 (*)    Albumin  2.6 (*)    Alkaline Phosphatase 191 (*)    All other components within normal limits  LIPASE, BLOOD - Abnormal; Notable for the following components:   Lipase 61 (*)    All other components within normal limits       ECG: Ordered Personally reviewed and interpreted by me showing: HR : 118 Rhythm:Sinus tachycardia Borderline T abnormalities, inferior leads QTC 438  BNP (last 3 results) No results for input(s): BNP in the last 8760 hours.    No results for input(s): DDIMER, FERRITIN, LDH, CRP in the last 72 hours.     _____________   The recent clinical data is shown below. Vitals:   12/28/23 1915 12/28/23 2103 12/28/23 2117 12/28/23 2132  BP: (!) 152/72 (!) 156/79 (!) 148/79 (!) 139/94  Pulse: (!) 101 (!) 124 (!) 114 (!) 115  Resp: 18 (!) 24 ROLLEN)  25 (!) 28  Temp:  98.6 F (37 C)  98.8 F (37.1 C)  TempSrc:  Oral  Oral  SpO2: 100% 100% 100% 100%    WBC     Component Value Date/Time   WBC 6.0 12/28/2023 1701   LYMPHSABS 0.8 12/28/2023 1701   MONOABS 0.5 12/28/2023 1701   EOSABS 0.1 12/28/2023 1701   BASOSABS 0.0 12/28/2023 1701      UA   ordered     Results for orders placed or performed during the hospital encounter of 09/13/22  Urine Culture     Status: None   Collection Time: 09/14/22  1:55 AM   Specimen: Urine, Catheterized  Result Value Ref Range Status   Specimen Description   Final    URINE, CATHETERIZED Performed at [facility], [address], [place], [place] [address]    Special Requests   Final    NONE Performed at [facility], [address], [place], [place] [address]    Culture   Final    NO GROWTH Performed at [facility], [address] [place], [place] [address]    Report Status 09/15/2022 FINAL  Final   *Note: Due to a large number of results and/or encounters for the requested time period, some results have not been displayed. A complete set of results can be found in Results Review.      __________________________________________________________ Recent Labs  Lab 12/28/23 1701  NA 141  K 3.5  CO2 24  GLUCOSE 124*  BUN 28*  CREATININE 0.66  CALCIUM  7.8*    Cr  stable,    Lab Results  Component Value Date   CREATININE 0.66 12/28/2023   CREATININE 0.90 12/18/2023   CREATININE 1.69 (H) 11/26/2023    Recent Labs  Lab 12/28/23 1701  AST 24  ALT 11  ALKPHOS 191*  BILITOT 1.0  PROT 5.8*  ALBUMIN  2.6*   Lab Results  Component Value Date   CALCIUM  7.8 (L) 12/28/2023   PHOS 2.9 05/27/2022           Plt: Lab Results  Component Value Date   PLT 175 12/28/2023         Recent Labs  Lab 12/28/23 1701  WBC 6.0  NEUTROABS 4.6  HGB 7.0*  HCT 24.1*  MCV 103.0*  PLT 175    HG/HCT   stable,      Component Value Date/Time   HGB 7.0 (L) 12/28/2023 1701   HGB 9.3 (L) 10/31/2023 1213   HCT 24.1 (L) 12/28/2023 1701   MCV 103.0 (H) 12/28/2023 1701      Recent Labs  Lab 12/28/23 1701  LIPASE 61*   No results for input(s): AMMONIA in the last 168 hours.    _______________________________________________ Hospitalist was called for admission for upper gi bleed     The following Work up has been ordered so far:  Orders Placed This Encounter  Procedures   CT ABDOMEN PELVIS W CONTRAST   CBC with Differential   Comprehensive metabolic panel   Protime-INR   Occult bld gastric/duodenum (cup to lab)   Lipase, blood   Diet NPO time specified   ED Cardiac monitoring   Informed Consent Details: Physician/Practitioner Attestation; Transcribe to consent form and obtain patient signature   Consult to gastroenterology   Consult to hospitalist   Pulse oximetry, continuous   POC occult blood, ED   Type and screen [facility]   Prepare RBC (crossmatch)  OTHER Significant initial  Findings:  labs showing:     DM  labs:  HbA1C: No results for input(s): HGBA1C in the last 8760 hours.     CBG (last 3)  No results for input(s): GLUCAP in the last 72 hours.        Cultures:    Component Value Date/Time   SDES  09/14/2022 0155    URINE, CATHETERIZED Performed at [facility], [address] [place], [place] [address]    SPECREQUEST  09/14/2022 0155    NONE Performed at [facility], [address] [place], [place] [address]    CULT  09/14/2022 0155    NO GROWTH Performed at [facility], [address] [place], [place] [address]    REPTSTATUS 09/15/2022 FINAL 09/14/2022 0155      Radiological Exams on Admission: CT ABDOMEN PELVIS W CONTRAST Result Date: 12/28/2023 CLINICAL DATA:  Abdomen pain history of breast cancer EXAM: CT ABDOMEN AND PELVIS WITH CONTRAST TECHNIQUE: Multidetector CT imaging of the abdomen and pelvis was performed using the standard protocol following bolus administration of intravenous contrast. RADIATION DOSE REDUCTION: This exam was performed according to the departmental dose-optimization program which includes automated exposure control, adjustment of the mA and/or kV according to patient size and/or use of iterative reconstruction technique. CONTRAST:  OMNIPAQUE  IOHEXOL  300 MG/ML  SOLN COMPARISON:  CT 12/26/2023, 07/26/2023, 05/02/2023, 08/08/2021, 01/31/2023 FINDINGS: Lower chest: Lung bases demonstrate no acute airspace disease. Elevation of the right diaphragm with bandlike atelectasis or scarring at the right base, similar compared to prior. Moderate hiatal hernia. Hepatobiliary: Numerous hypodense liver lesions corresponding to history of metastatic disease. Lesions appear slightly less conspicuous compared to February exam, generally smaller compared with December 2024, for example index posterior right hepatic dome lesion measures 13 x 10 mm on series 2, image 14, previously 18 x 14 mm. Distended gallbladder with multiple stones. No biliary dilatation. Lobulated liver surface as before. Pancreas: Unremarkable. No pancreatic ductal dilatation or surrounding inflammatory changes. Spleen: Normal in size without focal abnormality. Adrenals/Urinary Tract: Adrenal glands are normal. Kidneys show no hydronephrosis. The bladder is normal Stomach/Bowel: Stomach shows moderate fluid distension. There is no dilated small bowel. No acute bowel wall thickening. Vascular/Lymphatic: Aortic atherosclerosis. No enlarged abdominal or pelvic lymph nodes. Reproductive: Uterus unremarkable. No evidence for recurrent right adnexal/ovarian mass or soft tissue. Other:  Negative for pelvic effusion or free air. Musculoskeletal: Widespread skeletal metastatic disease with similar kyphotic deformity at T11 through L1. IMPRESSION: 1. No CT evidence for acute intra-abdominal or pelvic abnormality. Distended gallbladder with multiple stones but similar appearance compared with prior exams and no right upper quadrant inflammatory process. 2. Numerous hypodense liver lesions corresponding to history of metastatic disease. Lesions appear slightly less conspicuous compared to February exam, generally smaller compared with December 2024 study. 3. 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 4. Moderate hiatal hernia. 5. Aortic atherosclerosis. Aortic Atherosclerosis (ICD10-I70.0). Electronically Signed   By: Luke Bun M.D.   On: 12/28/2023 20:07   _______________________________________________________________________________________________________ Latest  Blood pressure (!) 139/94, pulse (!) 115, temperature 98.8 F (37.1 C), temperature source Oral, resp. rate (!) 28, last menstrual period 05/28/2013, SpO2 100%.   Vitals  labs and radiology finding personally reviewed  Review of Systems:    Pertinent positives include: coffee ground emesis  Constitutional:  No weight loss, night sweats, Fevers, chills, fatigue, weight loss  HEENT:  No headaches, Difficulty swallowing,Tooth/dental problems,Sore throat,  No sneezing, itching, ear ache, nasal congestion, post nasal drip,  Cardio-vascular:  No chest pain, Orthopnea, PND, anasarca, dizziness, palpitations.no Bilateral lower extremity swelling  GI:  No heartburn, indigestion, abdominal pain, nausea, vomiting, diarrhea, change in bowel habits, loss of appetite, melena, blood in stool, hematemesis Resp:  no shortness of breath at rest. No dyspnea on exertion, No excess mucus, no productive cough, No non-productive cough, No coughing up of blood.No change in color of mucus.No  wheezing. Skin:  no rash or lesions. No jaundice GU:  no dysuria, change in color of urine, no urgency or frequency. No straining to urinate.  No flank pain.  Musculoskeletal:  No joint pain or no joint swelling. No decreased range of motion. No back pain.  Psych:  No change in mood or affect. No depression or anxiety. No memory loss.  Neuro: no localizing neurological complaints, no tingling, no weakness, no double vision, no gait abnormality, no slurred speech, no confusion  All systems reviewed and apart from HOPI all are negative _______________________________________________________________________________________________ Past Medical History:   Past Medical History:  Diagnosis Date   Anemia    Cancer (HCC)    Family history of breast cancer    Family history of colon cancer    Family history of melanoma    Family history of prostate cancer    Gallstones    History of hiatal hernia    History of kidney stones    Mastitis    reports history of recurrent mastitis   Metastatic breast cancer    Obesity    Psoriasis       Past Surgical History:  Procedure Laterality Date   CYSTOSCOPY/URETEROSCOPY/HOLMIUM LASER/STENT PLACEMENT Left 10/17/2022   Procedure: CYSTOSCOPY, LEFT RETROGRADE PYELOGRAM, LEFT URETEROSCOPY, HOLMIUM LASER LITHOTRIPSY, BASKET EXTRACTION, AND LEFT URETERAL STENT PLACEMENT;  Surgeon: Devere Lonni Righter, MD;  Location: [facility];  Service: Urology;  Laterality: Left;  45 MINUTES   ESOPHAGOGASTRODUODENOSCOPY (EGD) WITH PROPOFOL  N/A 05/27/2022   Procedure: ESOPHAGOGASTRODUODENOSCOPY (EGD) WITH PROPOFOL ;  Surgeon: Burnette Fallow, MD;  Location: [facility];  Service: Gastroenterology;  Laterality: N/A;   EYE SURGERY     cataract   GLAUCOMA SURGERY  late 1990's   IR THORACENTESIS ASP PLEURAL SPACE W/IMG GUIDE  10/28/2018   IR THORACENTESIS ASP PLEURAL SPACE W/IMG GUIDE  10/29/2018   OPEN REDUCTION INTERNAL FIXATION (ORIF) DISTAL RADIAL FRACTURE Left  01/09/2019   Procedure: OPEN REDUCTION INTERNAL FIXATION (ORIF) HUMERAL FRACTURE;  Surgeon: Kay Kemps, MD;  Location: [facility];  Service: Orthopedics;  Laterality: Left;    Social History:  Ambulatory   independently or  wheelchair      reports that she has never smoked. She has never used smokeless tobacco. She reports that she does not drink alcohol and does not use drugs.   Family History:   Family History  Problem Relation Age of Onset   Breast cancer Sister        in her 97's-70s   Heart disease Brother        CABG   Heart disease Brother        CABG   Skin cancer Brother        melanoma   Colon cancer Cousin    Heart attack Mother    Heart attack Father    Prostate cancer Brother    ______________________________________________________________________________________________ Allergies: Allergies  Allergen Reactions   Ferumoxytol  Other (See Comments)    Pt had hypersensitivity to Ferumoxytol . She had Facial Flushing. See note from 07/14/2021  Prior to Admission medications   Medication Sig Start Date End Date Taking? Authorizing Provider  acetaminophen  (TYLENOL ) 500 MG tablet Take 1,000 mg by mouth every 6 (six) hours as needed for mild pain (pain score 1-3).    [provider]  albuterol  (VENTOLIN  HFA) 108 (90 Base) MCG/ACT inhaler Inhale 2 puffs into the lungs every 6 (six) hours as needed for wheezing or shortness of breath. 11/21/23   Loretha Ash, MD  Apremilast  (OTEZLA ) 30 MG TABS Take 1 tablet (30 mg total) by mouth 2 (two) times daily. 01/14/23   Alm [name] SAILOR, DO  capecitabine  (XELODA ) 500 MG tablet Take 3 tabs (1500 mg) by mouth every AM and 2 tablets by mouth in PM Take for 7 days, followed by a 7 day rest period. Repeat every 14 days. Take within 30 minutes of a meal. 11/04/23   Iruku, Praveena, MD  clobetasol  (TEMOVATE ) 0.05 % external solution Apply 1 Application topically 2 (two) times daily. 01/22/23   Alm [name] SAILOR, DO   Cyanocobalamin  (B-12) 1000 MCG TABS Take 1 tablet (1,000 mcg total) by mouth daily. 05/27/23   Iruku, Praveena, MD  Denosumab  (XGEVA  [place]) Inject 1 Dose into the skin every 30 (thirty) days.    [provider]  furosemide  (LASIX ) 20 MG tablet Take 1 tablet (20 mg total) by mouth daily. 12/24/22   Crawford Morna Pickle, NP  hydrOXYzine  (ATARAX ) 10 MG tablet Take 1-2 tablets (10-20 mg total) by mouth 3 (three) times daily as needed. 08/14/22   Fleming, Zelda W, NP  ipratropium-albuterol  (DUONEB) 0.5-2.5 (3) MG/3ML SOLN Take 3 mLs by nebulization every 6 (six) hours as needed. 05/03/22   Iruku, Praveena, MD  loperamide  (IMODIUM ) 2 MG capsule Take 2 mg by mouth daily as needed for diarrhea or loose stools.    [provider]  ondansetron  (ZOFRAN -ODT) 8 MG disintegrating tablet Dissolve 1 tablet (8 mg total) by mouth every 8 (eight) hours as needed for nausea. 09/09/23   Causey, Morna Pickle, NP  OYSTER SHELL CALCIUM  PLUS D 500-5 MG-MCG TABS TAKE ONE TABLET BY MOUTH EVERY MORNING and TAKE ONE TABLET BY MOUTH AT NOON and TAKE ONE TABLET BY MOUTH EVERY EVENING 04/27/21   [name], Sandria BROCKS, MD  pantoprazole  (PROTONIX ) 40 MG tablet Take 1 tablet (40 mg total) by mouth daily. 11/01/23     potassium chloride  (MICRO-K ) 10 MEQ CR capsule Take 1 capsule (10 mEq total) by mouth daily. 12/24/22   Crawford Morna Pickle, NP  sertraline  (ZOLOFT ) 50 MG tablet Take 1.5 tablets (75 mg total) by mouth daily. Take 75 mg by mouth daily 10/04/23   Iruku, Praveena, MD  tacrolimus  (PROTOPIC ) 0.1 % ointment Apply topically 2 (two) times daily. Apply to affected areas of psoriasis on the days not using triamcinolone . 09/03/22   Alm [name] SAILOR, DO  traMADol  (ULTRAM ) 50 MG tablet Take 1 tablet (50 mg total) by mouth every 6 (six) hours as needed for moderate pain (pain score 4-6) or severe pain (pain score 7-10). 06/25/23   Causey, Morna Pickle, NP  triamcinolone  ointment (KENALOG ) 0.1 % Apply 1 Application  topically 2 (two) times daily to affected areas of psoriasis for 2 weeks, then take 2 week break before restarting. 01/14/23   Alm [name] SAILOR, DO    ___________________________________________________________________________________________________ Physical Exam:    12/28/2023    9:32 PM 12/28/2023    9:17 PM 12/28/2023    9:03 PM  Vitals with BMI  Systolic 139 148 843  Diastolic 94 79  79  Pulse 115 114 124     1. General:  in No  Acute distress   Chronically ill   -appearing 2. Psychological: Alert and   Oriented 3. Head/ENT:  Dry Mucous Membranes                          Head Non traumatic, neck supple                       Poor Dentition 4. SKIN:  decreased Skin turgor,  Skin clean Dry and intact no rash    5. Heart: Regular rate and rhythm no  Murmur, no Rub or gallop 6. Lungs:  no wheezes or crackles   7. Abdomen: Soft,  non-tender, Non distended   obese  bowel sounds present 8. Lower extremities: no clubbing, cyanosis, no  edema 9. Neurologically Grossly intact, moving all 4 extremities equally   10. MSK: Normal range of motion    Chart has been reviewed  ______________________________________________________________________________________________  Assessment/Plan  63 y.o. female with medical history significant of metastatic breast cancer and  hypercalcemia of malignancy, iron deficiency anemia, upper GI bleed depression bronchitis hypertension pleural effusions microcytic anemia   Admitted for upper gi bleed    Present on Admission:  Malignant neoplasm metastatic to bone (HCC)  Anemia of chronic disease  Depression  Essential hypertension  Upper GI bleed     Malignant neoplasm metastatic to bone (HCC) Chronic stable followed by oncology  Anemia of chronic disease Patient will need transfusion follow CBC posttransfusion  Depression Resume home medications when able to tolerate  Essential hypertension Allow permissive hypertension  Upper GI bleed  -  Glasgow Blatchford score BUN >18.2   Hg  <41F , HR >100    >1 Justifies admission and aggressive management    Admit toprogressive given above    -  ER  Provider spoke to gastroenterology ( [place],  ) they will see patient in a.m. appreciate their consult   - serial CBC.    - Monitor for any recurrence,  evidence of hemodynamic instability or significant blood loss  - Transfuse  2 units  - Establish at least 2 PIV and fluid resuscitate   - clear liquids for tonight keep nothing by mouth post midnight,   -  administer Protonix    twice a day      Other plan as per orders.  DVT prophylaxis:  SCD     Code Status:    Code Status: Prior FULL CODE as per patient  I had personally discussed CODE STATUS with patient   ACP   none    Family Communication:   Family not at  Bedside    Diet  Diet Orders (From admission, onward)     Start     Ordered   12/29/23 0001  Diet NPO time specified  Diet effective midnight        12/28/23 2155            Disposition Plan:      To home once workup is complete and patient is stable   Following barriers for discharge:  Anemia corrected h/H stable                               Consult Orders  (From admission, onward)           Start     Ordered   12/28/23 2112  Consult to hospitalist  Once       Provider:  (Not yet assigned)  Question Answer Comment  Place call to: [facility]   Reason for Consult Admit      12/28/23 2111                                Consults called: margarete Berke   Admission status:  ED Disposition     ED Disposition  Admit   Condition  --   Comment  Hospital [facility] [[id number]]  Level of Care: Progressive [102]  Admit to Progressive based on following criteria: MULTISYSTEM THREATS such as stable sepsis, metabolic/electrolyte imbalance with or without encephalopathy that is responding to early treatment.  May  admit patient to [facility] or [facility] if equivalent level of care is available:: No  Covid Evaluation: Asymptomatic - no recent exposure (last 10 days) testing not required  Diagnosis: Upper GI bleed [[id number]]  Admitting Physician: Jeliyah Middlebrooks [[id number]]  Attending Physician: Maryellen Dowdle [[id number]]  Certification:: I certify this patient will need inpatient services for at least 2 midnights            inpatient     I Expect 2 midnight stay secondary to severity of patient's current illness need for inpatient interventions justified by the following:  hemodynamic instability despite optimal treatment (tachycardia )   Severe lab/radiological/exam abnormalities including:    Coffee ground emesis    and extensive comorbidities including:   malignancy,  That are currently affecting medical management.   I expect  patient to be hospitalized for 2 midnights requiring inpatient medical care.  Patient is at high risk for adverse outcome (such as loss of life or disability) if not treated.  Indication for inpatient stay as follows:   Hemodynamic instability despite maximal medical therapy,    Need for  IV fluids,, IV ppi    Level of care     progressive     Emmette Katt 12/28/2023, 11:11 PM    [facility]     after 2 AM please page floor coverage   If 7AM-7PM, please contact the day team taking care of the patient using [email or web address]

## 2023-12-28 NOTE — Assessment & Plan Note (Signed)
-   Glasgow Blatchford score BUN >18.2   Hg  <56F , HR >100    >1 Justifies admission and aggressive management    Admit toprogressive given above    -  ER  Provider spoke to gastroenterology ( [place],  ) they will see patient in a.m. appreciate their consult   - serial CBC.    - Monitor for any recurrence,  evidence of hemodynamic instability or significant blood loss  - Transfuse  2 units  - Establish at least 2 PIV and fluid resuscitate   - clear liquids for tonight keep nothing by mouth post midnight,   -  administer Protonix    twice a day

## 2023-12-28 NOTE — Assessment & Plan Note (Signed)
 Patient will need transfusion follow CBC posttransfusion

## 2023-12-28 NOTE — ED Triage Notes (Signed)
 Patient presents from home due to coffee ground emesis today. She reports nausea and abdominal pain. Denies bloody stool. EMS administered 250 ml of fluid.   HX Breast cancer  EMS vitals: 134/72 BP 114 HR  99% SPO2 on 1.5 L O 2 (chronic) 135 CBG

## 2023-12-28 NOTE — Subjective & Objective (Signed)
 Coffee ground emesis since this AM, no melena At baseline Hg at 9 now down to 7 CT abd showed gall stones and known liver mets Hem occult neg Hx of metastatic breast cancer Spoke to [facility]  Started on Protonix  and getting transfused

## 2023-12-28 NOTE — Assessment & Plan Note (Signed)
Resume home medications when able to tolerate 

## 2023-12-28 NOTE — Assessment & Plan Note (Signed)
 Allow permissive hypertension

## 2023-12-29 DIAGNOSIS — K922 Gastrointestinal hemorrhage, unspecified: Secondary | ICD-10-CM | POA: Diagnosis not present

## 2023-12-29 DIAGNOSIS — C7951 Secondary malignant neoplasm of bone: Secondary | ICD-10-CM | POA: Diagnosis not present

## 2023-12-29 DIAGNOSIS — K920 Hematemesis: Secondary | ICD-10-CM

## 2023-12-29 DIAGNOSIS — D638 Anemia in other chronic diseases classified elsewhere: Secondary | ICD-10-CM | POA: Diagnosis not present

## 2023-12-29 LAB — CBC
HCT: 27.1 % — ABNORMAL LOW (ref 36.0–46.0)
HCT: 24.2 % — ABNORMAL LOW (ref 36.0–46.0)
HCT: 27.6 % — ABNORMAL LOW (ref 36.0–46.0)
HCT: 24.2 % — ABNORMAL LOW (ref 36.0–46.0)
Hemoglobin: 8.3 g/dL — ABNORMAL LOW (ref 12.0–15.0)
Hemoglobin: 7.6 g/dL — ABNORMAL LOW (ref 12.0–15.0)
Hemoglobin: 8.2 g/dL — ABNORMAL LOW (ref 12.0–15.0)
Hemoglobin: 7.3 g/dL — ABNORMAL LOW (ref 12.0–15.0)
MCH: 29.9 pg (ref 26.0–34.0)
MCH: 29.8 pg (ref 26.0–34.0)
MCH: 29.5 pg (ref 26.0–34.0)
MCH: 29.7 pg (ref 26.0–34.0)
MCHC: 30.6 g/dL (ref 30.0–36.0)
MCHC: 31.4 g/dL (ref 30.0–36.0)
MCHC: 29.7 g/dL — ABNORMAL LOW (ref 30.0–36.0)
MCHC: 30.2 g/dL (ref 30.0–36.0)
MCV: 97.5 fL (ref 80.0–100.0)
MCV: 94.9 fL (ref 80.0–100.0)
MCV: 99.3 fL (ref 80.0–100.0)
MCV: 98.4 fL (ref 80.0–100.0)
Platelets: 179 K/uL (ref 150–400)
Platelets: 171 K/uL (ref 150–400)
Platelets: 194 K/uL (ref 150–400)
Platelets: 163 K/uL (ref 150–400)
RBC: 2.78 MIL/uL — ABNORMAL LOW (ref 3.87–5.11)
RBC: 2.55 MIL/uL — ABNORMAL LOW (ref 3.87–5.11)
RBC: 2.78 MIL/uL — ABNORMAL LOW (ref 3.87–5.11)
RBC: 2.46 MIL/uL — ABNORMAL LOW (ref 3.87–5.11)
RDW: 21.7 % — ABNORMAL HIGH (ref 11.5–15.5)
RDW: 22.8 % — ABNORMAL HIGH (ref 11.5–15.5)
RDW: 23.5 % — ABNORMAL HIGH (ref 11.5–15.5)
RDW: 23 % — ABNORMAL HIGH (ref 11.5–15.5)
WBC: 6.5 K/uL (ref 4.0–10.5)
WBC: 6.5 K/uL (ref 4.0–10.5)
WBC: 7.6 K/uL (ref 4.0–10.5)
WBC: 6.3 K/uL (ref 4.0–10.5)
nRBC: 0.8 % — ABNORMAL HIGH (ref 0.0–0.2)
nRBC: 0.5 % — ABNORMAL HIGH (ref 0.0–0.2)
nRBC: 0.4 % — ABNORMAL HIGH (ref 0.0–0.2)
nRBC: 0.3 % — ABNORMAL HIGH (ref 0.0–0.2)

## 2023-12-29 LAB — COMPREHENSIVE METABOLIC PANEL WITH GFR
ALT: 14 U/L (ref 0–44)
AST: 23 U/L (ref 15–41)
Albumin: 2.5 g/dL — ABNORMAL LOW (ref 3.5–5.0)
Alkaline Phosphatase: 171 U/L — ABNORMAL HIGH (ref 38–126)
Anion gap: 8 (ref 5–15)
BUN: 27 mg/dL — ABNORMAL HIGH (ref 8–23)
CO2: 24 mmol/L (ref 22–32)
Calcium: 7.6 mg/dL — ABNORMAL LOW (ref 8.9–10.3)
Chloride: 110 mmol/L (ref 98–111)
Creatinine, Ser: 0.68 mg/dL (ref 0.44–1.00)
GFR, Estimated: >60 mL/min (ref >60)
Glucose, Bld: 97 mg/dL (ref 70–99)
Potassium: 3.4 mmol/L — ABNORMAL LOW (ref 3.5–5.1)
Sodium: 142 mmol/L (ref 135–145)
Total Bilirubin: 1.7 mg/dL — ABNORMAL HIGH (ref 0.0–1.2)
Total Protein: 5.3 g/dL — ABNORMAL LOW (ref 6.5–8.1)

## 2023-12-29 LAB — VITAMIN B12: Vitamin B-12: 688 pg/mL (ref 180–914)

## 2023-12-29 LAB — RETICULOCYTES
Immature Retic Fract: 46.4 % — ABNORMAL HIGH (ref 2.3–15.9)
RBC.: 2.76 MIL/uL — ABNORMAL LOW (ref 3.87–5.11)
Retic Count, Absolute: 133.6 K/uL (ref 19.0–186.0)
Retic Ct Pct: 4.8 % — ABNORMAL HIGH (ref 0.4–3.1)

## 2023-12-29 LAB — CK: Total CK: 35 U/L — ABNORMAL LOW (ref 38–234)

## 2023-12-29 LAB — FERRITIN: Ferritin: 6 ng/mL — ABNORMAL LOW (ref 11–307)

## 2023-12-29 LAB — IRON AND TIBC
Iron: 111 ug/dL (ref 28–170)
Saturation Ratios: 20 % (ref 10.4–31.8)
TIBC: 547 ug/dL — ABNORMAL HIGH (ref 250–450)
UIBC: 436 ug/dL

## 2023-12-29 LAB — PHOSPHORUS: Phosphorus: 2.1 mg/dL — ABNORMAL LOW (ref 2.5–4.6)

## 2023-12-29 LAB — TSH: TSH: 2.291 u[IU]/mL (ref 0.350–4.500)

## 2023-12-29 LAB — MAGNESIUM: Magnesium: 1.8 mg/dL (ref 1.7–2.4)

## 2023-12-29 LAB — HIV ANTIBODY (ROUTINE TESTING W REFLEX): HIV Screen 4th Generation wRfx: NONREACTIVE

## 2023-12-29 LAB — FOLATE: Folate: 11.2 ng/mL (ref >5.9)

## 2023-12-29 MED ORDER — SODIUM CHLORIDE 0.9 % IV SOLN: INTRAVENOUS | Status: DC

## 2023-12-29 MED ORDER — GERHARDT'S BUTT CREAM
TOPICAL_CREAM | Freq: Three times a day (TID) | CUTANEOUS | Status: DC
  Filled 2023-12-29: qty 60

## 2023-12-29 MED ORDER — TRAMADOL HCL 50 MG PO TABS
50.0000 mg | ORAL_TABLET | Freq: Four times a day (QID) | ORAL | Status: DC | PRN
  Filled 2023-12-29: qty 1

## 2023-12-29 MED ORDER — SODIUM CHLORIDE 0.9 % IV SOLN: INTRAVENOUS | Status: AC

## 2023-12-29 MED ORDER — ACETAMINOPHEN 650 MG RE SUPP
650.0000 mg | Freq: Four times a day (QID) | RECTAL | Status: DC | PRN
Start: 2023-12-29 — End: 2023-12-31

## 2023-12-29 MED ORDER — SERTRALINE HCL 50 MG PO TABS
75.0000 mg | ORAL_TABLET | Freq: Every day | ORAL | Status: DC
  Administered 2023-12-29 – 2023-12-31 (×3): 75 mg via ORAL
  Filled 2023-12-29 (×3): qty 1

## 2023-12-29 MED ORDER — IPRATROPIUM-ALBUTEROL 0.5-2.5 (3) MG/3ML IN SOLN: 3.0000 mL | Freq: Four times a day (QID) | RESPIRATORY_TRACT | Status: DC | PRN

## 2023-12-29 MED ORDER — ONDANSETRON HCL 4 MG PO TABS: 4.0000 mg | ORAL_TABLET | Freq: Four times a day (QID) | ORAL | Status: DC | PRN

## 2023-12-29 MED ORDER — ACETAMINOPHEN 325 MG PO TABS
650.0000 mg | ORAL_TABLET | Freq: Four times a day (QID) | ORAL | Status: DC | PRN
  Administered 2023-12-29: 650 mg via ORAL
  Filled 2023-12-29: qty 2

## 2023-12-29 MED ORDER — POTASSIUM CHLORIDE CRYS ER 20 MEQ PO TBCR
40.0000 meq | EXTENDED_RELEASE_TABLET | Freq: Once | ORAL | Status: AC
Start: 2023-12-29 — End: 2023-12-29
  Administered 2023-12-29: 40 meq via ORAL
  Filled 2023-12-29: qty 2

## 2023-12-29 MED ORDER — ONDANSETRON HCL 4 MG/2ML IJ SOLN
4.0000 mg | Freq: Four times a day (QID) | INTRAMUSCULAR | Status: DC | PRN
  Administered 2023-12-29: 4 mg via INTRAVENOUS
  Filled 2023-12-29: qty 2

## 2023-12-29 NOTE — Progress Notes (Addendum)
 New admission arrived on the unit. RBC transfusion was completed in the ED today. Blood transfusion was stopped at 0019 per ED charge RN.

## 2023-12-29 NOTE — Consult Note (Signed)
 WOC Nurse Consult Note: Reason for Consult: multiple wounds present on admission  Wound type: 1. Heel with partial thickness skin loss ? Rubbing of shoe pink and dry  2. Coccyx with full thickness red and moist linear likely related to moisture associated skin damage  ICD-10 CM Codes for Irritant Dermatitis L24A2 - Due to fecal, urinary or dual incontinence 3.  Erythema with partial thickness skin loss d/t intertriginous dermatitis abdominal folds/pannus  L30.4  - Erythema intertrigo. Also used for abrasion of the hand, chafing of the skin, dermatitis due to sweating and friction, friction dermatitis, friction eczema, and genital/thigh intertrigo.  Pressure Injury POA: NA, not related to pressure  Measurement: see nursing flowsheet; widespread to coccyx and underneath pannus  Wound bed: as above  Drainage (amount, consistency, odor) see nursing flowsheet  Periwound: patient appears to have multiple plaques scattered all over thighs, abdomen ? Related to her plaque psoriasis  Dressing procedure/placement/frequency:  Cleanse sacrum/coccyx with soap and water, dry and apply Xeroform Soila [id number]) to linear full thickness area coccyx daily and secure with silicone foam.  Cleanse underneath abdominal folds/pannus with Vashe wound cleanser Soila [phone]) do not rinse and allow to air dry. Apply Minna ARTHURS as follows: Order Gerlean # [phone] Measure and cut length of InterDry to fit in skin folds that have skin breakdown Tuck InterDry fabric into skin folds in a single layer, allow for 2 inches of overhang from skin edges to allow for wicking to occur May remove to bathe; dry area thoroughly and then tuck into affected areas again Do not apply any creams or ointments when using InterDry DO NOT THROW AWAY FOR 5 DAYS unless soiled with stool DO NOT [facility] product, this will inactivate the silver in the material  New sheet of Interdry should be applied after 5 days of use if patient continues to have skin  breakdown 3.  Cleanse groin/buttocks/inner thighs with Vashe, allow to air dry then apply Gerhardt's Butt Cream 3 times daily to entire area.   POC discussed with bedside nurse. WOC team will not follow. Re-consult if further needs arise.   Thank you,    Powell Bar MSN, RN-BC, [employer]

## 2023-12-29 NOTE — Progress Notes (Signed)
 [facility]  PROGRESS NOTE  Suzanne [name] FMW:[id number] DOB: 1961-02-16 DOA: 12/28/2023 PCP: [name], NP   Brief HPI:   63 y.o. female with medical history significant of metastatic breast cancer and  hypercalcemia of malignancy, iron deficiency anemia, upper GI bleed depression bronchitis hypertension pleural effusions microcytic anemia  Presented with   coffee-ground emesis At baseline Hg at 9 now down to 7 CT abd showed gall stones and known liver mets Hem occult neg Hx of metastatic breast cancer Spoke to [facility]  Started on Protonix  and getting transfused   Assessment/Plan:   Upper GI bleed -Presented with coffee-ground emesis -Presented with hemoglobin of 7.0, status post 2 unit PRBC -Hemoglobin improved to 8.3 initially, again dropped to 7.6 this morning -Gastroenterology has been consulted -Continue clear liquid diet - Continue Protonix  40 mg IV every 12 hours   Malignant neoplasm metastatic to bone (HCC) Chronic stable,followed by oncology   Anemia of chronic disease Hemoglobin 7.6 as above   Depression Continue Zoloft    Essential hypertension Blood pressure is soft - Lasix  on hold  Hypokalemia - Potassium is 3.4 - Replace potassium and follow BMP in am    Medications     sodium chloride    Intravenous Once   sodium chloride    Intravenous Once   pantoprazole  (PROTONIX ) IV  40 mg Intravenous Q12H   sertraline   75 mg Oral Daily     Data Reviewed:   CBG:  No results for input(s): GLUCAP in the last 168 hours.  SpO2: 99 % O2 Flow Rate (L/min): 2 L/min    Vitals:   12/29/23 0520 12/29/23 0525 12/29/23 0530 12/29/23 0535  BP:      Pulse: (!) 103 100 94 95  Resp:      Temp:      TempSrc:      SpO2: 99% 100% 99% 99%  Weight:      Height:          Data Reviewed:  Basic Metabolic Panel: Recent Labs  Lab 12/28/23 1701 12/29/23 0150  NA 141 142  K 3.5 3.4*  CL 107 110  CO2 24 24  GLUCOSE 124* 97  BUN 28* 27*   CREATININE 0.66 0.68  CALCIUM  7.8* 7.6*  MG  --  1.8  PHOS  --  2.1*    CBC: Recent Labs  Lab 12/28/23 1701 12/29/23 0150  WBC 6.0 6.5  NEUTROABS 4.6  --   HGB 7.0* 8.3*  HCT 24.1* 27.1*  MCV 103.0* 97.5  PLT 175 179    LFT Recent Labs  Lab 12/28/23 1701 12/29/23 0150  AST 24 23  ALT 11 14  ALKPHOS 191* 171*  BILITOT 1.0 1.7*  PROT 5.8* 5.3*  ALBUMIN  2.6* 2.5*     Antibiotics: Anti-infectives (From admission, onward)    None        DVT prophylaxis: SCDs  Code Status: Full code  Family Communication: No family at bedside   CONSULTS gastroenterology   Subjective   Denies nausea vomiting or abdominal pain this morning.   Objective    Physical Examination:   General: Appears in no acute distress Cardiovascular: S1-S2, regular Respiratory: Lungs clear to auscultation bilaterally Abdomen: Soft, nontender, no epigastric tenderness on palpation Extremities: No edema in the lower extremities Neurologic: Alert, oriented x 3, intact insight and judgment   Status is: Inpatient: Yes           Suzanne [name]   [facility] If 7PM-7AM, please contact  night-coverage at [email or web address], Office  [phone]   12/29/2023, 7:33 AM  LOS: 1 day

## 2023-12-29 NOTE — Consult Note (Signed)
 Referring Provider: TH Primary Care Physician:  [name], NP Primary Gastroenterologist: [place]  Reason for Consultation: Coffee-ground emesis, dark stools  HPI: Suzanne [name] is a 63 y.o. female with past medical history of metastatic breast cancer, hypertension, pleural effusion, history of gastric ulcer as mentioned below presented to the hospital with coffee-ground emesis and dark stools.  CBC shows stable hemoglobin, normal platelet counts, mildly elevated alkaline phosphatase likely from bone metastases, T. bili 1.7.  According to patient, she started having coffee-ground emesis yesterday followed by dark stool.  Complaining of epigastric pain and reflux.  Denies trouble swallowing or pain while swallowing.  Denies any bright red blood in the stool.  EGD in December 2023 for evaluation of melena showed large hiatal hernia with erosions and ulceration hernia sac as well as 6 mm created gastric ulcer at the junction of body and fundus. Past Medical History:  Diagnosis Date   Anemia    Cancer (HCC)    Family history of breast cancer    Family history of colon cancer    Family history of melanoma    Family history of prostate cancer    Gallstones    History of hiatal hernia    History of kidney stones    Mastitis    reports history of recurrent mastitis   Metastatic breast cancer    Obesity    Psoriasis     Past Surgical History:  Procedure Laterality Date   CYSTOSCOPY/URETEROSCOPY/HOLMIUM LASER/STENT PLACEMENT Left 10/17/2022   Procedure: CYSTOSCOPY, LEFT RETROGRADE PYELOGRAM, LEFT URETEROSCOPY, HOLMIUM LASER LITHOTRIPSY, BASKET EXTRACTION, AND LEFT URETERAL STENT PLACEMENT;  Surgeon: Devere Lonni Righter, MD;  Location: [facility];  Service: Urology;  Laterality: Left;  45 MINUTES   ESOPHAGOGASTRODUODENOSCOPY (EGD) WITH PROPOFOL  N/A 05/27/2022   Procedure: ESOPHAGOGASTRODUODENOSCOPY (EGD) WITH PROPOFOL ;  Surgeon: Burnette Fallow, MD;  Location: [facility];   Service: Gastroenterology;  Laterality: N/A;   EYE SURGERY     cataract   GLAUCOMA SURGERY  late 1990's   IR THORACENTESIS ASP PLEURAL SPACE W/IMG GUIDE  10/28/2018   IR THORACENTESIS ASP PLEURAL SPACE W/IMG GUIDE  10/29/2018   OPEN REDUCTION INTERNAL FIXATION (ORIF) DISTAL RADIAL FRACTURE Left 01/09/2019   Procedure: OPEN REDUCTION INTERNAL FIXATION (ORIF) HUMERAL FRACTURE;  Surgeon: Kay Kemps, MD;  Location: [facility];  Service: Orthopedics;  Laterality: Left;    Prior to Admission medications   Medication Sig Start Date End Date Taking? Authorizing Provider  acetaminophen  (TYLENOL ) 500 MG tablet Take 1,000 mg by mouth every 6 (six) hours as needed for mild pain (pain score 1-3).   Yes [provider]  albuterol  (VENTOLIN  HFA) 108 (90 Base) MCG/ACT inhaler Inhale 2 puffs into the lungs every 6 (six) hours as needed for wheezing or shortness of breath. 11/21/23  Yes Iruku, Praveena, MD  capecitabine  (XELODA ) 500 MG tablet Take 3 tabs (1500 mg) by mouth every AM and 2 tablets by mouth in PM Take for 7 days, followed by a 7 day rest period. Repeat every 14 days. Take within 30 minutes of a meal. 11/04/23  Yes Iruku, Praveena, MD  Denosumab  (XGEVA  [place]) Inject 1 Dose into the skin every 30 (thirty) days.   Yes [provider]  furosemide  (LASIX ) 20 MG tablet Take 1 tablet (20 mg total) by mouth daily. 12/24/22  Yes Causey, Morna Pickle, NP  ipratropium-albuterol  (DUONEB) 0.5-2.5 (3) MG/3ML SOLN Take 3 mLs by nebulization every 6 (six) hours as needed. 05/03/22  Yes Iruku, Praveena, MD  loperamide  (IMODIUM )  2 MG capsule Take 2 mg by mouth daily as needed for diarrhea or loose stools.   Yes [provider]  ondansetron  (ZOFRAN -ODT) 8 MG disintegrating tablet Dissolve 1 tablet (8 mg total) by mouth every 8 (eight) hours as needed for nausea. 09/09/23  Yes Causey, Morna Pickle, NP  pantoprazole  (PROTONIX ) 40 MG tablet Take 1 tablet (40 mg total) by mouth daily. 11/01/23  Yes    potassium chloride  (MICRO-K ) 10 MEQ CR capsule Take 1 capsule (10 mEq total) by mouth daily. 12/24/22  Yes Causey, Morna Pickle, NP  traMADol  (ULTRAM ) 50 MG tablet Take 1 tablet (50 mg total) by mouth every 6 (six) hours as needed for moderate pain (pain score 4-6) or severe pain (pain score 7-10). 06/25/23  Yes Causey, Morna Pickle, NP  triamcinolone  ointment (KENALOG ) 0.1 % Apply 1 Application topically 2 (two) times daily to affected areas of psoriasis for 2 weeks, then take 2 week break before restarting. 01/14/23  Yes Alm [name] SAILOR, DO  Apremilast  (OTEZLA ) 30 MG TABS Take 1 tablet (30 mg total) by mouth 2 (two) times daily. 01/14/23   Alm [name] SAILOR, DO  clobetasol  (TEMOVATE ) 0.05 % external solution Apply 1 Application topically 2 (two) times daily. Patient not taking: Reported on 12/28/2023 01/22/23   Alm [name] SAILOR, DO  Cyanocobalamin  (B-12) 1000 MCG TABS Take 1 tablet (1,000 mcg total) by mouth daily. Patient not taking: Reported on 12/28/2023 05/27/23   Iruku, Praveena, MD  hydrOXYzine  (ATARAX ) 10 MG tablet Take 1-2 tablets (10-20 mg total) by mouth 3 (three) times daily as needed. Patient not taking: Reported on 12/28/2023 08/14/22   [name], NP  OYSTER SHELL CALCIUM  PLUS D 500-5 MG-MCG TABS TAKE ONE TABLET BY MOUTH EVERY MORNING and TAKE ONE TABLET BY MOUTH AT NOON and TAKE ONE TABLET BY MOUTH EVERY EVENING Patient not taking: Reported on 12/28/2023 04/27/21   [name], Sandria BROCKS, MD  sertraline  (ZOLOFT ) 50 MG tablet Take 1.5 tablets (75 mg total) by mouth daily. Take 75 mg by mouth daily 10/04/23   Iruku, Praveena, MD  tacrolimus  (PROTOPIC ) 0.1 % ointment Apply topically 2 (two) times daily. Apply to affected areas of psoriasis on the days not using triamcinolone . Patient not taking: Reported on 12/28/2023 09/03/22   Alm [name] SAILOR, DO    Scheduled Meds:  sodium chloride    Intravenous Once   sodium chloride    Intravenous Once   Gerhardt's butt cream   Topical TID    pantoprazole  (PROTONIX ) IV  40 mg Intravenous Q12H   potassium chloride   40 mEq Oral Once   sertraline   75 mg Oral Daily   Continuous Infusions:  sodium chloride  75 mL/hr at 12/29/23 0151   PRN Meds:.acetaminophen  **OR** acetaminophen , ipratropium-albuterol , ondansetron  **OR** ondansetron  (ZOFRAN ) IV, traMADol   Allergies as of 12/28/2023 - Review Complete 12/28/2023  Allergen Reaction Noted   Ferumoxytol  Other (See Comments) 07/14/2021    Family History  Problem Relation Age of Onset   Breast cancer Sister        in her 58's-70s   Heart disease Brother        CABG   Heart disease Brother        CABG   Skin cancer Brother        melanoma   Colon cancer Cousin    Heart attack Mother    Heart attack Father    Prostate cancer Brother     Social History   Socioeconomic History   Marital status: Widowed  Spouse name: Not on file   Number of children: 1   Years of education: Not on file   Highest education level: Not on file  Occupational History   Occupation: Visual merchandiser Programs    Comment: Self-employed  Tobacco Use   Smoking status: Never   Smokeless tobacco: Never   Tobacco comments:    second hand smoke exposure  Vaping Use   Vaping status: Never Used  Substance and Sexual Activity   Alcohol use: No   Drug use: No   Sexual activity: Not Currently  Other Topics Concern   Not on file  Social History Narrative   Has a daughter named [name]. Daughter has CP.   Social Drivers of Corporate investment banker Strain: Low Risk  (09/17/2022)   Overall Financial Resource Strain (CARDIA)    Difficulty of Paying Living Expenses: Not hard at all  Food Insecurity: No Food Insecurity (12/29/2023)   Hunger Vital Sign    Worried About Running Out of Food in the Last Year: Never true    Ran Out of Food in the Last Year: Never true  Transportation Needs: No Transportation Needs (12/29/2023)   PRAPARE - Administrator, Civil Service (Medical): No     Lack of Transportation (Non-Medical): No  Physical Activity: Inactive (03/07/2023)   Exercise Vital Sign    Days of Exercise per Week: 0 days    Minutes of Exercise per Session: 0 min  Stress: Stress Concern Present (03/07/2023)   [employer] of Occupational Health - Occupational Stress Questionnaire    Feeling of Stress : To some extent  Social Connections: Socially Isolated (03/07/2023)   Social Connection and Isolation Panel    Frequency of Communication with Friends and Family: Once a week    Frequency of Social Gatherings with Friends and Family: Once a week    Attends Religious Services: Never    Database administrator or Organizations: Yes    Attends Banker Meetings: Never    Marital Status: Widowed  Intimate Partner Violence: Not At Risk (12/29/2023)   Humiliation, Afraid, Rape, and Kick questionnaire    Fear of Current or Ex-Partner: No    Emotionally Abused: No    Physically Abused: No    Sexually Abused: No    Review of Systems: All negative except as stated above in HPI.  Physical Exam: Vital signs: Vitals:   12/29/23 0535 12/29/23 0847  BP:  93/67  Pulse: 95 (!) 105  Resp:  17  Temp:  98.3 F (36.8 C)  SpO2: 99%    Last BM Date : 12/27/23 (per pt) General:   Alert,  Well-developed, well-nourished, pleasant and cooperative in NAD Normocephalic, atraumatic, extraocular movement intact Lungs: No visible respiratory distress Heart:  Regular rate and rhythm; no murmurs, clicks, rubs,  or gallops. Abdomen: Soft, nontender, nondistended, bowel sound present, no peritoneal signs Mood and affect normal Alert and oriented x 3 Rectal:  Deferred  GI:  Lab Results: Recent Labs    12/28/23 1701 12/29/23 0150 12/29/23 1100  WBC 6.0 6.5 6.5  HGB 7.0* 8.3* 7.6*  HCT 24.1* 27.1* 24.2*  PLT 175 179 171   BMET Recent Labs    12/28/23 1701 12/29/23 0150  NA 141 142  K 3.5 3.4*  CL 107 110  CO2 24 24  GLUCOSE 124* 97  BUN 28* 27*   CREATININE 0.66 0.68  CALCIUM  7.8* 7.6*   LFT Recent Labs    12/29/23 0150  PROT 5.3*  ALBUMIN  2.5*  AST 23  ALT 14  ALKPHOS 171*  BILITOT 1.7*   PT/INR Recent Labs    12/28/23 1701  LABPROT 14.7  INR 1.1     Studies/Results: CT ABDOMEN PELVIS W CONTRAST Result Date: 12/28/2023 CLINICAL DATA:  Abdomen pain history of breast cancer EXAM: CT ABDOMEN AND PELVIS WITH CONTRAST TECHNIQUE: Multidetector CT imaging of the abdomen and pelvis was performed using the standard protocol following bolus administration of intravenous contrast. RADIATION DOSE REDUCTION: This exam was performed according to the departmental dose-optimization program which includes automated exposure control, adjustment of the mA and/or kV according to patient size and/or use of iterative reconstruction technique. CONTRAST:  OMNIPAQUE  IOHEXOL  300 MG/ML  SOLN COMPARISON:  CT 12/26/2023, 07/26/2023, 05/02/2023, 08/08/2021, 01/31/2023 FINDINGS: Lower chest: Lung bases demonstrate no acute airspace disease. Elevation of the right diaphragm with bandlike atelectasis or scarring at the right base, similar compared to prior. Moderate hiatal hernia. Hepatobiliary: Numerous hypodense liver lesions corresponding to history of metastatic disease. Lesions appear slightly less conspicuous compared to February exam, generally smaller compared with December 2024, for example index posterior right hepatic dome lesion measures 13 x 10 mm on series 2, image 14, previously 18 x 14 mm. Distended gallbladder with multiple stones. No biliary dilatation. Lobulated liver surface as before. Pancreas: Unremarkable. No pancreatic ductal dilatation or surrounding inflammatory changes. Spleen: Normal in size without focal abnormality. Adrenals/Urinary Tract: Adrenal glands are normal. Kidneys show no hydronephrosis. The bladder is normal Stomach/Bowel: Stomach shows moderate fluid distension. There is no dilated small bowel. No acute bowel wall  thickening. Vascular/Lymphatic: Aortic atherosclerosis. No enlarged abdominal or pelvic lymph nodes. Reproductive: Uterus unremarkable. No evidence for recurrent right adnexal/ovarian mass or soft tissue. Other: Negative for pelvic effusion or free air. Musculoskeletal: Widespread skeletal metastatic disease with similar kyphotic deformity at T11 through L1. IMPRESSION: 1. No CT evidence for acute intra-abdominal or pelvic abnormality. Distended gallbladder with multiple stones but similar appearance compared with prior exams and no right upper quadrant inflammatory process. 2. Numerous hypodense liver lesions corresponding to history of metastatic disease. Lesions appear slightly less conspicuous compared to February exam, generally smaller compared with December 2024 study. 3. 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 4. Moderate hiatal hernia. 5. Aortic atherosclerosis. Aortic Atherosclerosis (ICD10-I70.0). Electronically Signed   By: Luke Bun M.D.   On: 12/28/2023 20:07    Impression/Plan: -Coffee-ground emesis and melena in patient with known history of gastric ulcer as well as Ole ulcers. - Metastatic breast cancer with mets to bones and liver - Acute on chronic anemia  Recommendations --------------------------- - Okay to have full liquid diet today. - Plan for EGD tomorrow.  Keep n.p.o. past midnight.  Continue Protonix  IV twice daily for now.  Monitor H&H.  Transfuse if hemoglobin less than 7.  Risks (bleeding, infection, bowel perforation that could require surgery, sedation-related changes in cardiopulmonary systems), benefits (identification and possible treatment of source of symptoms, exclusion of certain causes of symptoms), and alternatives (watchful waiting, radiographic imaging studies, empiric medical treatment)  were explained to patient/family in detail and patient wishes to proceed.     LOS: 1 day   Layla Lah  MD,  FACP 12/29/2023, 12:26 PM  Contact #  [phone]

## 2023-12-29 NOTE — H&P (View-Only) (Signed)
 Referring Provider: TH Primary Care Physician:  [name], NP Primary Gastroenterologist: Sampson  Reason for Consultation: Coffee-ground emesis, dark stools  HPI: Suzanne [name] is a 63 y.o. female with past medical history of metastatic breast cancer, hypertension, pleural effusion, history of gastric ulcer as mentioned below presented to the hospital with coffee-ground emesis and dark stools.  CBC shows stable hemoglobin, normal platelet counts, mildly elevated alkaline phosphatase likely from bone metastases, T. bili 1.7.  According to patient, she started having coffee-ground emesis yesterday followed by dark stool.  Complaining of epigastric pain and reflux.  Denies trouble swallowing or pain while swallowing.  Denies any bright red blood in the stool.  EGD in December 2023 for evaluation of melena showed large hiatal hernia with erosions and ulceration hernia sac as well as 6 mm created gastric ulcer at the junction of body and fundus. Past Medical History:  Diagnosis Date   Anemia    Cancer (HCC)    Family history of breast cancer    Family history of colon cancer    Family history of melanoma    Family history of prostate cancer    Gallstones    History of hiatal hernia    History of kidney stones    Mastitis    reports history of recurrent mastitis   Metastatic breast cancer    Obesity    Psoriasis     Past Surgical History:  Procedure Laterality Date   CYSTOSCOPY/URETEROSCOPY/HOLMIUM LASER/STENT PLACEMENT Left 10/17/2022   Procedure: CYSTOSCOPY, LEFT RETROGRADE PYELOGRAM, LEFT URETEROSCOPY, HOLMIUM LASER LITHOTRIPSY, BASKET EXTRACTION, AND LEFT URETERAL STENT PLACEMENT;  Surgeon: Devere Lonni Righter, MD;  Location: [facility];  Service: Urology;  Laterality: Left;  45 MINUTES   ESOPHAGOGASTRODUODENOSCOPY (EGD) WITH PROPOFOL  N/A 05/27/2022   Procedure: ESOPHAGOGASTRODUODENOSCOPY (EGD) WITH PROPOFOL ;  Surgeon: Burnette Fallow, MD;  Location: [facility];   Service: Gastroenterology;  Laterality: N/A;   EYE SURGERY     cataract   GLAUCOMA SURGERY  late 1990's   IR THORACENTESIS ASP PLEURAL SPACE W/IMG GUIDE  10/28/2018   IR THORACENTESIS ASP PLEURAL SPACE W/IMG GUIDE  10/29/2018   OPEN REDUCTION INTERNAL FIXATION (ORIF) DISTAL RADIAL FRACTURE Left 01/09/2019   Procedure: OPEN REDUCTION INTERNAL FIXATION (ORIF) HUMERAL FRACTURE;  Surgeon: Kay Kemps, MD;  Location: [facility];  Service: Orthopedics;  Laterality: Left;    Prior to Admission medications   Medication Sig Start Date End Date Taking? Authorizing Provider  acetaminophen  (TYLENOL ) 500 MG tablet Take 1,000 mg by mouth every 6 (six) hours as needed for mild pain (pain score 1-3).   Yes [provider]  albuterol  (VENTOLIN  HFA) 108 (90 Base) MCG/ACT inhaler Inhale 2 puffs into the lungs every 6 (six) hours as needed for wheezing or shortness of breath. 11/21/23  Yes Iruku, Praveena, MD  capecitabine  (XELODA ) 500 MG tablet Take 3 tabs (1500 mg) by mouth every AM and 2 tablets by mouth in PM Take for 7 days, followed by a 7 day rest period. Repeat every 14 days. Take within 30 minutes of a meal. 11/04/23  Yes Iruku, Praveena, MD  Denosumab  (XGEVA  Dulac) Inject 1 Dose into the skin every 30 (thirty) days.   Yes [provider]  furosemide  (LASIX ) 20 MG tablet Take 1 tablet (20 mg total) by mouth daily. 12/24/22  Yes Causey, Morna Pickle, NP  ipratropium-albuterol  (DUONEB) 0.5-2.5 (3) MG/3ML SOLN Take 3 mLs by nebulization every 6 (six) hours as needed. 05/03/22  Yes Iruku, Praveena, MD  loperamide  (IMODIUM )  2 MG capsule Take 2 mg by mouth daily as needed for diarrhea or loose stools.   Yes [provider]  ondansetron  (ZOFRAN -ODT) 8 MG disintegrating tablet Dissolve 1 tablet (8 mg total) by mouth every 8 (eight) hours as needed for nausea. 09/09/23  Yes Causey, Morna Pickle, NP  pantoprazole  (PROTONIX ) 40 MG tablet Take 1 tablet (40 mg total) by mouth daily. 11/01/23  Yes    potassium chloride  (MICRO-K ) 10 MEQ CR capsule Take 1 capsule (10 mEq total) by mouth daily. 12/24/22  Yes Causey, Morna Pickle, NP  traMADol  (ULTRAM ) 50 MG tablet Take 1 tablet (50 mg total) by mouth every 6 (six) hours as needed for moderate pain (pain score 4-6) or severe pain (pain score 7-10). 06/25/23  Yes Causey, Morna Pickle, NP  triamcinolone  ointment (KENALOG ) 0.1 % Apply 1 Application topically 2 (two) times daily to affected areas of psoriasis for 2 weeks, then take 2 week break before restarting. 01/14/23  Yes Alm [name] SAILOR, DO  Apremilast  (OTEZLA ) 30 MG TABS Take 1 tablet (30 mg total) by mouth 2 (two) times daily. 01/14/23   Alm [name] SAILOR, DO  clobetasol  (TEMOVATE ) 0.05 % external solution Apply 1 Application topically 2 (two) times daily. Patient not taking: Reported on 12/28/2023 01/22/23   Alm [name] SAILOR, DO  Cyanocobalamin  (B-12) 1000 MCG TABS Take 1 tablet (1,000 mcg total) by mouth daily. Patient not taking: Reported on 12/28/2023 05/27/23   Iruku, Praveena, MD  hydrOXYzine  (ATARAX ) 10 MG tablet Take 1-2 tablets (10-20 mg total) by mouth 3 (three) times daily as needed. Patient not taking: Reported on 12/28/2023 08/14/22   [name], NP  OYSTER SHELL CALCIUM  PLUS D 500-5 MG-MCG TABS TAKE ONE TABLET BY MOUTH EVERY MORNING and TAKE ONE TABLET BY MOUTH AT NOON and TAKE ONE TABLET BY MOUTH EVERY EVENING Patient not taking: Reported on 12/28/2023 04/27/21   [name], Sandria BROCKS, MD  sertraline  (ZOLOFT ) 50 MG tablet Take 1.5 tablets (75 mg total) by mouth daily. Take 75 mg by mouth daily 10/04/23   Iruku, Praveena, MD  tacrolimus  (PROTOPIC ) 0.1 % ointment Apply topically 2 (two) times daily. Apply to affected areas of psoriasis on the days not using triamcinolone . Patient not taking: Reported on 12/28/2023 09/03/22   Alm [name] SAILOR, DO    Scheduled Meds:  sodium chloride    Intravenous Once   sodium chloride    Intravenous Once   Gerhardt's butt cream   Topical TID    pantoprazole  (PROTONIX ) IV  40 mg Intravenous Q12H   potassium chloride   40 mEq Oral Once   sertraline   75 mg Oral Daily   Continuous Infusions:  sodium chloride  75 mL/hr at 12/29/23 0151   PRN Meds:.acetaminophen  **OR** acetaminophen , ipratropium-albuterol , ondansetron  **OR** ondansetron  (ZOFRAN ) IV, traMADol   Allergies as of 12/28/2023 - Review Complete 12/28/2023  Allergen Reaction Noted   Ferumoxytol  Other (See Comments) 07/14/2021    Family History  Problem Relation Age of Onset   Breast cancer Sister        in her 83's-70s   Heart disease Brother        CABG   Heart disease Brother        CABG   Skin cancer Brother        melanoma   Colon cancer Cousin    Heart attack Mother    Heart attack Father    Prostate cancer Brother     Social History   Socioeconomic History   Marital status: Widowed  Spouse name: Not on file   Number of children: 1   Years of education: Not on file   Highest education level: Not on file  Occupational History   Occupation: Visual merchandiser Programs    Comment: Self-employed  Tobacco Use   Smoking status: Never   Smokeless tobacco: Never   Tobacco comments:    second hand smoke exposure  Vaping Use   Vaping status: Never Used  Substance and Sexual Activity   Alcohol use: No   Drug use: No   Sexual activity: Not Currently  Other Topics Concern   Not on file  Social History Narrative   Has a daughter named [name]. Daughter has CP.   Social Drivers of Corporate investment banker Strain: Low Risk  (09/17/2022)   Overall Financial Resource Strain (CARDIA)    Difficulty of Paying Living Expenses: Not hard at all  Food Insecurity: No Food Insecurity (12/29/2023)   Hunger Vital Sign    Worried About Running Out of Food in the Last Year: Never true    Ran Out of Food in the Last Year: Never true  Transportation Needs: No Transportation Needs (12/29/2023)   PRAPARE - Administrator, Civil Service (Medical): No     Lack of Transportation (Non-Medical): No  Physical Activity: Inactive (03/07/2023)   Exercise Vital Sign    Days of Exercise per Week: 0 days    Minutes of Exercise per Session: 0 min  Stress: Stress Concern Present (03/07/2023)   [employer] of Occupational Health - Occupational Stress Questionnaire    Feeling of Stress : To some extent  Social Connections: Socially Isolated (03/07/2023)   Social Connection and Isolation Panel    Frequency of Communication with Friends and Family: Once a week    Frequency of Social Gatherings with Friends and Family: Once a week    Attends Religious Services: Never    Database administrator or Organizations: Yes    Attends Banker Meetings: Never    Marital Status: Widowed  Intimate Partner Violence: Not At Risk (12/29/2023)   Humiliation, Afraid, Rape, and Kick questionnaire    Fear of Current or Ex-Partner: No    Emotionally Abused: No    Physically Abused: No    Sexually Abused: No    Review of Systems: All negative except as stated above in HPI.  Physical Exam: Vital signs: Vitals:   12/29/23 0535 12/29/23 0847  BP:  93/67  Pulse: 95 (!) 105  Resp:  17  Temp:  98.3 F (36.8 C)  SpO2: 99%    Last BM Date : 12/27/23 (per pt) General:   Alert,  Well-developed, well-nourished, pleasant and cooperative in NAD Normocephalic, atraumatic, extraocular movement intact Lungs: No visible respiratory distress Heart:  Regular rate and rhythm; no murmurs, clicks, rubs,  or gallops. Abdomen: Soft, nontender, nondistended, bowel sound present, no peritoneal signs Mood and affect normal Alert and oriented x 3 Rectal:  Deferred  GI:  Lab Results: Recent Labs    12/28/23 1701 12/29/23 0150 12/29/23 1100  WBC 6.0 6.5 6.5  HGB 7.0* 8.3* 7.6*  HCT 24.1* 27.1* 24.2*  PLT 175 179 171   BMET Recent Labs    12/28/23 1701 12/29/23 0150  NA 141 142  K 3.5 3.4*  CL 107 110  CO2 24 24  GLUCOSE 124* 97  BUN 28* 27*   CREATININE 0.66 0.68  CALCIUM  7.8* 7.6*   LFT Recent Labs    12/29/23 0150  PROT 5.3*  ALBUMIN  2.5*  AST 23  ALT 14  ALKPHOS 171*  BILITOT 1.7*   PT/INR Recent Labs    12/28/23 1701  LABPROT 14.7  INR 1.1     Studies/Results: CT ABDOMEN PELVIS W CONTRAST Result Date: 12/28/2023 CLINICAL DATA:  Abdomen pain history of breast cancer EXAM: CT ABDOMEN AND PELVIS WITH CONTRAST TECHNIQUE: Multidetector CT imaging of the abdomen and pelvis was performed using the standard protocol following bolus administration of intravenous contrast. RADIATION DOSE REDUCTION: This exam was performed according to the departmental dose-optimization program which includes automated exposure control, adjustment of the mA and/or kV according to patient size and/or use of iterative reconstruction technique. CONTRAST:  OMNIPAQUE  IOHEXOL  300 MG/ML  SOLN COMPARISON:  CT 12/26/2023, 07/26/2023, 05/02/2023, 08/08/2021, 01/31/2023 FINDINGS: Lower chest: Lung bases demonstrate no acute airspace disease. Elevation of the right diaphragm with bandlike atelectasis or scarring at the right base, similar compared to prior. Moderate hiatal hernia. Hepatobiliary: Numerous hypodense liver lesions corresponding to history of metastatic disease. Lesions appear slightly less conspicuous compared to February exam, generally smaller compared with December 2024, for example index posterior right hepatic dome lesion measures 13 x 10 mm on series 2, image 14, previously 18 x 14 mm. Distended gallbladder with multiple stones. No biliary dilatation. Lobulated liver surface as before. Pancreas: Unremarkable. No pancreatic ductal dilatation or surrounding inflammatory changes. Spleen: Normal in size without focal abnormality. Adrenals/Urinary Tract: Adrenal glands are normal. Kidneys show no hydronephrosis. The bladder is normal Stomach/Bowel: Stomach shows moderate fluid distension. There is no dilated small bowel. No acute bowel wall  thickening. Vascular/Lymphatic: Aortic atherosclerosis. No enlarged abdominal or pelvic lymph nodes. Reproductive: Uterus unremarkable. No evidence for recurrent right adnexal/ovarian mass or soft tissue. Other: Negative for pelvic effusion or free air. Musculoskeletal: Widespread skeletal metastatic disease with similar kyphotic deformity at T11 through L1. IMPRESSION: 1. No CT evidence for acute intra-abdominal or pelvic abnormality. Distended gallbladder with multiple stones but similar appearance compared with prior exams and no right upper quadrant inflammatory process. 2. Numerous hypodense liver lesions corresponding to history of metastatic disease. Lesions appear slightly less conspicuous compared to February exam, generally smaller compared with December 2024 study. 3. 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 4. Moderate hiatal hernia. 5. Aortic atherosclerosis. Aortic Atherosclerosis (ICD10-I70.0). Electronically Signed   By: Luke Bun M.D.   On: 12/28/2023 20:07    Impression/Plan: -Coffee-ground emesis and melena in patient with known history of gastric ulcer as well as Ole ulcers. - Metastatic breast cancer with mets to bones and liver - Acute on chronic anemia  Recommendations --------------------------- - Okay to have full liquid diet today. - Plan for EGD tomorrow.  Keep n.p.o. past midnight.  Continue Protonix  IV twice daily for now.  Monitor H&H.  Transfuse if hemoglobin less than 7.  Risks (bleeding, infection, bowel perforation that could require surgery, sedation-related changes in cardiopulmonary systems), benefits (identification and possible treatment of source of symptoms, exclusion of certain causes of symptoms), and alternatives (watchful waiting, radiographic imaging studies, empiric medical treatment)  were explained to patient/family in detail and patient wishes to proceed.     LOS: 1 day   Layla Lah  MD,  FACP 12/29/2023, 12:26 PM  Contact #  [phone]

## 2023-12-30 ENCOUNTER — Inpatient Hospital Stay (HOSPITAL_COMMUNITY): Payer: Self-pay | Admitting: Certified Registered"

## 2023-12-30 ENCOUNTER — Encounter (HOSPITAL_COMMUNITY)
Admission: EM | Disposition: A | Discharge status: Home / Self Care | Payer: Self-pay | Source: Home / Self Care | Attending: Family Medicine

## 2023-12-30 ENCOUNTER — Encounter (HOSPITAL_COMMUNITY): Payer: Self-pay | Admitting: Internal Medicine

## 2023-12-30 ENCOUNTER — Encounter (HOSPITAL_COMMUNITY): Payer: Self-pay | Admitting: Certified Registered"

## 2023-12-30 DIAGNOSIS — D62 Acute posthemorrhagic anemia: Secondary | ICD-10-CM | POA: Diagnosis not present

## 2023-12-30 DIAGNOSIS — E66813 Obesity, class 3: Secondary | ICD-10-CM | POA: Diagnosis not present

## 2023-12-30 DIAGNOSIS — K449 Diaphragmatic hernia without obstruction or gangrene: Secondary | ICD-10-CM | POA: Diagnosis not present

## 2023-12-30 DIAGNOSIS — Z6839 Body mass index (BMI) 39.0-39.9, adult: Secondary | ICD-10-CM | POA: Diagnosis not present

## 2023-12-30 DIAGNOSIS — K922 Gastrointestinal hemorrhage, unspecified: Secondary | ICD-10-CM | POA: Diagnosis not present

## 2023-12-30 DIAGNOSIS — D638 Anemia in other chronic diseases classified elsewhere: Secondary | ICD-10-CM | POA: Diagnosis not present

## 2023-12-30 DIAGNOSIS — C7951 Secondary malignant neoplasm of bone: Secondary | ICD-10-CM | POA: Diagnosis not present

## 2023-12-30 DIAGNOSIS — I10 Essential (primary) hypertension: Secondary | ICD-10-CM

## 2023-12-30 DIAGNOSIS — K920 Hematemesis: Secondary | ICD-10-CM | POA: Diagnosis not present

## 2023-12-30 LAB — BASIC METABOLIC PANEL WITH GFR
Anion gap: 9 (ref 5–15)
BUN: 14 mg/dL (ref 8–23)
CO2: 23 mmol/L (ref 22–32)
Calcium: 7.4 mg/dL — ABNORMAL LOW (ref 8.9–10.3)
Chloride: 108 mmol/L (ref 98–111)
Creatinine, Ser: 0.69 mg/dL (ref 0.44–1.00)
GFR, Estimated: >60 mL/min (ref >60)
Glucose, Bld: 99 mg/dL (ref 70–99)
Potassium: 3.8 mmol/L (ref 3.5–5.1)
Sodium: 140 mmol/L (ref 135–145)

## 2023-12-30 LAB — CBC
HCT: 23.5 % — ABNORMAL LOW (ref 36.0–46.0)
HCT: 22.7 % — ABNORMAL LOW (ref 36.0–46.0)
Hemoglobin: 7.2 g/dL — ABNORMAL LOW (ref 12.0–15.0)
Hemoglobin: 6.8 g/dL — CL (ref 12.0–15.0)
MCH: 30.6 pg (ref 26.0–34.0)
MCH: 29.6 pg (ref 26.0–34.0)
MCHC: 30.6 g/dL (ref 30.0–36.0)
MCHC: 30 g/dL (ref 30.0–36.0)
MCV: 100 fL (ref 80.0–100.0)
MCV: 98.7 fL (ref 80.0–100.0)
Platelets: 149 K/uL — ABNORMAL LOW (ref 150–400)
Platelets: 142 K/uL — ABNORMAL LOW (ref 150–400)
RBC: 2.35 MIL/uL — ABNORMAL LOW (ref 3.87–5.11)
RBC: 2.3 MIL/uL — ABNORMAL LOW (ref 3.87–5.11)
RDW: 22.8 % — ABNORMAL HIGH (ref 11.5–15.5)
RDW: 22 % — ABNORMAL HIGH (ref 11.5–15.5)
WBC: 5.1 K/uL (ref 4.0–10.5)
WBC: 4.1 K/uL (ref 4.0–10.5)
nRBC: 0.6 % — ABNORMAL HIGH (ref 0.0–0.2)
nRBC: 0 % (ref 0.0–0.2)

## 2023-12-30 LAB — PREPARE RBC (CROSSMATCH)

## 2023-12-30 SURGERY — EGD (ESOPHAGOGASTRODUODENOSCOPY)
Anesthesia: Monitor Anesthesia Care

## 2023-12-30 MED ORDER — PROPOFOL 500 MG/50ML IV EMUL
INTRAVENOUS | Status: AC
  Filled 2023-12-30: qty 50

## 2023-12-30 MED ORDER — PROPOFOL 10 MG/ML IV BOLUS
INTRAVENOUS | Status: DC | PRN
  Administered 2023-12-30: 30 mg via INTRAVENOUS

## 2023-12-30 MED ORDER — SODIUM CHLORIDE 0.9% IV SOLUTION: Freq: Once | INTRAVENOUS | Status: DC

## 2023-12-30 MED ORDER — SODIUM CHLORIDE 0.9% IV SOLUTION: Freq: Once | INTRAVENOUS | Status: AC

## 2023-12-30 MED ORDER — PROPOFOL 500 MG/50ML IV EMUL
INTRAVENOUS | Status: DC | PRN
  Administered 2023-12-30: 125 ug/kg/min via INTRAVENOUS

## 2023-12-30 MED ORDER — LIDOCAINE 2% (20 MG/ML) 5 ML SYRINGE
INTRAMUSCULAR | Status: DC | PRN
  Administered 2023-12-30: 100 mg via INTRAVENOUS

## 2023-12-30 MED ORDER — SODIUM PHOSPHATES 45 MMOLE/15ML IV SOLN
30.0000 mmol | Freq: Once | INTRAVENOUS | Status: AC
  Administered 2023-12-30: 30 mmol via INTRAVENOUS
  Filled 2023-12-30: qty 10

## 2023-12-30 NOTE — Progress Notes (Addendum)
 [facility]  PROGRESS NOTE  Suzanne [name] FMW:[id number] DOB: 1960-08-03 DOA: 12/28/2023 PCP: Suzanne [name] Suzanne [name]   Brief HPI:   63 y.o. female with medical history significant of metastatic breast cancer and  hypercalcemia of malignancy, iron deficiency anemia, upper GI bleed depression bronchitis hypertension pleural effusions microcytic anemia  Presented with   coffee-ground emesis At baseline Hg at 9 now down to 7 CT abd showed gall stones and known liver mets Hem occult neg Hx of metastatic breast cancer Spoke to [facility]  Started on Protonix  and getting transfused   Assessment/Plan:   Upper GI bleed -Presented with coffee-ground emesis -Presented with hemoglobin of 7.0, status post 2 unit PRBC -Hemoglobin improved to 8.3 initially, again dropped to 6.8 this morning -Gastroenterology has been consulted -Continue clear liquid diet - Continue Protonix  40 mg IV every 12 hours -Plan for EGD today  Iron deficiency anemia due to GI bleed as above -Hemoglobin dropped to 6.8 this morning -Will transfuse 1 unit PRBC -Follow CBC in a.m.   Malignant neoplasm metastatic to bone (HCC) Chronic stable,followed by oncology  Hypophosphatemia - Replace phosphorus and check phosphorus level in a.m.    Depression Continue Zoloft    Essential hypertension Blood pressure is soft - Lasix  on hold  Hypokalemia - replete    Medications     sodium chloride    Intravenous Once   sodium chloride    Intravenous Once   sodium chloride    Intravenous Once   sodium chloride    Intravenous Once   Gerhardt's butt cream   Topical TID   pantoprazole  (PROTONIX ) IV  40 mg Intravenous Q12H   sertraline   75 mg Oral Daily     Data Reviewed:   CBG:  No results for input(s): GLUCAP in the last 168 hours.  SpO2: 100 % O2 Flow Rate (L/min): 2 L/min    Vitals:   12/30/23 1156 12/30/23 1338 12/30/23 1340 12/30/23 1350  BP: (!) 148/74 (!) 127/52 (!) 123/47 120/63  Pulse: 85 91 88  86  Resp: 19 (!) 27 (!) 23 (!) 22  Temp: 97.8 F (36.6 C) 98 F (36.7 C)    TempSrc: Temporal Temporal    SpO2: 100% 100% 99% 100%  Weight:      Height:          Data Reviewed:  Basic Metabolic Panel: Recent Labs  Lab 12/28/23 1701 12/29/23 0150 12/30/23 0500  NA 141 142 140  K 3.5 3.4* 3.8  CL 107 110 108  CO2 24 24 23   GLUCOSE 124* 97 99  BUN 28* 27* 14  CREATININE 0.66 0.68 0.69  CALCIUM  7.8* 7.6* 7.4*  MG  --  1.8  --   PHOS  --  2.1*  --     CBC: Recent Labs  Lab 12/28/23 1701 12/29/23 0150 12/29/23 1100 12/29/23 1536 12/29/23 2133 12/30/23 0500 12/30/23 1114  WBC 6.0   < > 6.5 7.6 6.3 5.1 4.1  NEUTROABS 4.6  --   --   --   --   --   --   HGB 7.0*   < > 7.6* 8.2* 7.3* 7.2* 6.8*  HCT 24.1*   < > 24.2* 27.6* 24.2* 23.5* 22.7*  MCV 103.0*   < > 94.9 99.3 98.4 100.0 98.7  PLT 175   < > 171 194 163 149* 142*   < > = values in this interval not displayed.    LFT Recent Labs  Lab 12/28/23 1701 12/29/23 0150  AST 24  23  ALT 11 14  ALKPHOS 191* 171*  BILITOT 1.0 1.7*  PROT 5.8* 5.3*  ALBUMIN  2.6* 2.5*     Antibiotics: Anti-infectives (From admission, onward)    None        DVT prophylaxis: SCDs  Code Status: Full code  Family Communication: No family at bedside   CONSULTS gastroenterology   Subjective   Denies any complaints   Objective    Physical Examination:  General-appears in no acute distress Heart-S1-S2, regular, no murmur auscultated Lungs-clear to auscultation bilaterally, no wheezing or crackles auscultated Abdomen-soft, nontender, no organomegaly Extremities-no edema in the lower extremities Neuro-alert, oriented x3, no focal deficit noted   Status is: Inpatient: Yes           Suzanne [name]   [facility] If 7PM-7AM, please contact night-coverage at [email or web address], Office  [phone]   12/30/2023, 3:04 PM  LOS: 2 days

## 2023-12-30 NOTE — Plan of Care (Signed)

## 2023-12-30 NOTE — Interval H&P Note (Signed)
 History and Physical Interval Note:  12/30/2023 12:36 PM  Suzanne [name]  has presented today for surgery, with the diagnosis of Coffee-ground emesis, melena.  The various methods of treatment have been discussed with the patient and family. After consideration of risks, benefits and other options for treatment, the patient has consented to  Procedure(s): EGD (ESOPHAGOGASTRODUODENOSCOPY) (N/A) as a surgical intervention.  The patient's history has been reviewed, patient examined, no change in status, stable for surgery.  I have reviewed the patient's chart and labs.  Questions were answered to the patient's satisfaction.     BURNETTE ELSIE HERO

## 2023-12-30 NOTE — Progress Notes (Signed)
   12/30/23 1047  TOC Brief Assessment  Insurance and Status Reviewed  Patient has primary care physician Yes  Home environment has been reviewed Resides in single family home with daughter  Prior level of function: Independent with ADLs at baseline  Prior/Current Home Services No current home services  Social Drivers of Health Review SDOH reviewed no interventions necessary  Readmission risk has been reviewed Yes  Transition of care needs no transition of care needs at this time

## 2023-12-30 NOTE — Transfer of Care (Signed)
 Immediate Anesthesia Transfer of Care Note  Patient: Suzanne [name]  Procedure(s) Performed: EGD (ESOPHAGOGASTRODUODENOSCOPY)  Patient Location: PACU and Endoscopy Unit  Anesthesia Type:MAC  Level of Consciousness: awake, alert , and patient cooperative  Airway & Oxygen  Therapy: Patient Spontanous Breathing and Patient connected to nasal cannula oxygen   Post-op Assessment: Report given to RN and Post -op Vital signs reviewed and stable  Post vital signs: Reviewed and stable  Last Vitals:  Vitals Value Taken Time  BP 127/52 12/30/23 13:38  Temp 36.7 C 12/30/23 13:38  Pulse 88 12/30/23 13:40  Resp 23 12/30/23 13:40  SpO2 99 % 12/30/23 13:40  Vitals shown include unfiled device data.  Last Pain:  Vitals:   12/30/23 1338  TempSrc: Temporal  PainSc: Asleep         Complications: No notable events documented.

## 2023-12-30 NOTE — Anesthesia Postprocedure Evaluation (Signed)
 Anesthesia Post Note  Patient: Suzanne [name]  Procedure(s) Performed: EGD (ESOPHAGOGASTRODUODENOSCOPY)     Patient location during evaluation: PACU Anesthesia Type: MAC Level of consciousness: awake and alert Pain management: pain level controlled Vital Signs Assessment: post-procedure vital signs reviewed and stable Respiratory status: spontaneous breathing, nonlabored ventilation, respiratory function stable and patient connected to nasal cannula oxygen  Cardiovascular status: stable and blood pressure returned to baseline Postop Assessment: no apparent nausea or vomiting Anesthetic complications: no   No notable events documented.  Last Vitals:  Vitals:   12/30/23 1350 12/30/23 1505  BP: 120/63 (!) 140/64  Pulse: 86 86  Resp: (!) 22   Temp:  36.7 C  SpO2: 100% 100%    Last Pain:  Vitals:   12/30/23 1505  TempSrc: Oral  PainSc:                  Suzanne [name]

## 2023-12-30 NOTE — Op Note (Signed)
 [facility] Patient Name: Suzanne [name] Procedure Date: 12/30/2023 MRN: [id number] Attending MD: Elsie Cree , MD, [id number] Date of Birth: 11/27/1960 CSN: [id number] Age: 63 Admit Type: Inpatient Procedure:                Upper GI endoscopy Indications:              Acute post hemorrhagic anemia, Melena Providers:                Elsie Cree, MD, Robie Breed, RN,                            Coye Bade, Technician Referring MD:             [facility] Medicines:                Monitored Anesthesia Care Complications:            No immediate complications. Estimated Blood Loss:     Estimated blood loss: none. Procedure:                Pre-Anesthesia Assessment:                           - Prior to the procedure, a History and Physical                            was performed, and patient medications and                            allergies were reviewed. The patient's tolerance of                            previous anesthesia was also reviewed. The risks                            and benefits of the procedure and the sedation                            options and risks were discussed with the patient.                            All questions were answered, and informed consent                            was obtained. Prior Anticoagulants: The patient has                            taken no anticoagulant or antiplatelet agents. ASA                            Grade Assessment: IV - A patient with severe                            systemic disease that is a constant threat to life.  After reviewing the risks and benefits, the patient                            was deemed in satisfactory condition to undergo the                            procedure.                           After obtaining informed consent, the endoscope was                            passed under direct vision. Throughout the                             procedure, the patient's blood pressure, pulse, and                            oxygen  saturations were monitored continuously. The                            [id number]-[id number] ([id number]) Olympus endoscope was introduced                            through the mouth, and advanced to the second part                            of duodenum. The upper GI endoscopy was                            accomplished without difficulty. The patient                            tolerated the procedure well. Scope In: Scope Out: Findings:      A large hiatal hernia was present.      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      The exam of the esophagus was otherwise normal.      The entire examined stomach was normal.      The examined duodenum was normal.      - No old/fresh blood seen to extent of our examination. Impression:               - Large hiatal hernia.                           - Normal stomach.                           - Normal examined duodenum.                           - Suspect bleeding/anemia from large hiatal hernia                            with Cameron's erosions, and suspect this will be  recurrent problem. Moderate Sedation:      Not Applicable - Patient had care per Anesthesia. Recommendation:           - Return patient to hospital ward for ongoing care.                           - Soft diet today.                           - Continue present medications.                           [facility] will follow. Procedure Code(s):        --- Professional ---                           [phone], Esophagogastroduodenoscopy, flexible,                            transoral; diagnostic, including collection of                            specimen(s) by brushing or washing, when performed                            (separate procedure) Diagnosis Code(s):        --- Professional ---                           K44.9, Diaphragmatic hernia without obstruction or                             gangrene                           D62, Acute posthemorrhagic anemia                           K92.1, Melena (includes Hematochezia) CPT copyright 2022 American Medical Association. All rights reserved. The codes documented in this report are preliminary and upon coder review may  be revised to meet current compliance requirements. Elsie Cree, MD 12/30/2023 1:43:21 PM This report has been signed electronically. Number of Addenda: 0

## 2023-12-30 NOTE — Anesthesia Procedure Notes (Signed)
 Procedure Name: MAC Date/Time: 12/30/2023 1:20 PM  Performed by: Metta Andrea NOVAK, CRNAPre-anesthesia Checklist: Patient identified, Emergency Drugs available, Suction available, Patient being monitored and Timeout performed Oxygen  Delivery Method: Simple face mask Placement Confirmation: positive ETCO2

## 2023-12-30 NOTE — Anesthesia Preprocedure Evaluation (Addendum)
 Anesthesia Evaluation  Patient identified by MRN, date of birth, ID band Patient awake    Reviewed: Allergy & Precautions, NPO status , Patient's Chart, lab work & pertinent test results  Airway Mallampati: II  TM Distance: >3 FB Neck ROM: Full    Dental no notable dental hx.    Pulmonary shortness of breath and Long-Term Oxygen  Therapy   Pulmonary exam normal        Cardiovascular negative cardio ROS Normal cardiovascular exam     Neuro/Psych  PSYCHIATRIC DISORDERS  Depression    negative neurological ROS     GI/Hepatic Neg liver ROS, hiatal hernia,,,  Endo/Other    Class 3 obesity  Renal/GU negative Renal ROS     Musculoskeletal negative musculoskeletal ROS (+)    Abdominal  (+) + obese  Peds  Hematology  (+) Blood dyscrasia, anemia   Anesthesia Other Findings Coffee-ground emesis, melena  Reproductive/Obstetrics                              Anesthesia Physical Anesthesia Plan  ASA: 4  Anesthesia Plan: MAC   Post-op Pain Management:    Induction:   PONV Risk Score and Plan: 2 and Propofol  infusion and Treatment may vary due to age or medical condition  Airway Management Planned: Nasal Cannula  Additional Equipment:   Intra-op Plan:   Post-operative Plan:   Informed Consent: I have reviewed the patients History and Physical, chart, labs and discussed the procedure including the risks, benefits and alternatives for the proposed anesthesia with the patient or authorized representative who has indicated his/her understanding and acceptance.     Dental advisory given  Plan Discussed with: CRNA  Anesthesia Plan Comments:         Anesthesia Quick Evaluation

## 2023-12-31 ENCOUNTER — Other Ambulatory Visit: Payer: Self-pay

## 2023-12-31 ENCOUNTER — Other Ambulatory Visit (HOSPITAL_COMMUNITY): Payer: Self-pay

## 2023-12-31 ENCOUNTER — Encounter: Payer: Self-pay | Admitting: Hematology and Oncology

## 2023-12-31 DIAGNOSIS — K922 Gastrointestinal hemorrhage, unspecified: Secondary | ICD-10-CM | POA: Diagnosis not present

## 2023-12-31 DIAGNOSIS — K920 Hematemesis: Secondary | ICD-10-CM | POA: Diagnosis not present

## 2023-12-31 DIAGNOSIS — C7951 Secondary malignant neoplasm of bone: Secondary | ICD-10-CM | POA: Diagnosis not present

## 2023-12-31 DIAGNOSIS — D638 Anemia in other chronic diseases classified elsewhere: Secondary | ICD-10-CM | POA: Diagnosis not present

## 2023-12-31 LAB — TYPE AND SCREEN
ABO/RH(D): O POS
Antibody Screen: NEGATIVE
Unit division: 0
Unit division: 0

## 2023-12-31 LAB — CBC
HCT: 26.8 % — ABNORMAL LOW (ref 36.0–46.0)
Hemoglobin: 8.2 g/dL — ABNORMAL LOW (ref 12.0–15.0)
MCH: 29.4 pg (ref 26.0–34.0)
MCHC: 30.6 g/dL (ref 30.0–36.0)
MCV: 96.1 fL (ref 80.0–100.0)
Platelets: 144 K/uL — ABNORMAL LOW (ref 150–400)
RBC: 2.79 MIL/uL — ABNORMAL LOW (ref 3.87–5.11)
RDW: 21.2 % — ABNORMAL HIGH (ref 11.5–15.5)
WBC: 4.9 K/uL (ref 4.0–10.5)
nRBC: 0.4 % — ABNORMAL HIGH (ref 0.0–0.2)

## 2023-12-31 LAB — BPAM RBC
Blood Product Expiration Date: 202508162359
Blood Product Expiration Date: 202508162359
ISSUE DATE / TIME: 202508041509
ISSUE DATE / TIME: 202508022109
Unit Type and Rh: 5100
Unit Type and Rh: 5100

## 2023-12-31 LAB — C DIFFICILE QUICK SCREEN W PCR REFLEX
C Diff antigen: NEGATIVE
C Diff interpretation: NOT DETECTED
C Diff toxin: NEGATIVE

## 2023-12-31 LAB — PHOSPHORUS: Phosphorus: 3.1 mg/dL (ref 2.5–4.6)

## 2023-12-31 LAB — OCCULT BLOOD X 1 CARD TO LAB, STOOL: Fecal Occult Bld: NEGATIVE

## 2023-12-31 MED ORDER — PANTOPRAZOLE SODIUM 40 MG PO TBEC
40.0000 mg | DELAYED_RELEASE_TABLET | Freq: Every day | ORAL | 1 refills | Status: DC
  Filled 2023-12-31: qty 60, 60d supply, fill #0

## 2023-12-31 NOTE — Progress Notes (Signed)
 Discharge medication delivered to patient at bedside [facility]

## 2023-12-31 NOTE — Plan of Care (Signed)

## 2023-12-31 NOTE — Discharge Summary (Addendum)
 Physician Discharge Summary   Patient: Suzanne [name] MRN: [id number] DOB: 12/15/1960  Admit date:     12/28/2023  Discharge date: 12/31/23  Discharge Physician: [name]   PCP: [name], NP   Recommendations at discharge:   Follow-up PCP in 1 week Take Protonix  40 mg twice a day for 6 weeks and then 40 mg daily indefinitely Avoid aspirin, NSAIDs  Discharge Diagnoses: Principal Problem:   Upper GI bleed Active Problems:   Malignant neoplasm metastatic to bone (HCC)   Anemia of chronic disease   Depression   Essential hypertension  Resolved Problems:   * No resolved hospital problems. *  Hospital Course: 63 y.o. female with medical history significant of metastatic breast cancer and  hypercalcemia of malignancy, iron deficiency anemia, upper GI bleed depression bronchitis hypertension pleural effusions microcytic anemia  Presented with   coffee-ground emesis At baseline Hg at 9 now down to 7 CT abd showed gall stones and known liver mets Hem occult neg Hx of metastatic breast cancer Spoke to [facility]  Started on Protonix  and getting transfused  Assessment and Plan:  Upper GI bleed -Presented with coffee-ground emesis -Presented with hemoglobin of 7.0, status post 2 unit PRBC -Hemoglobin improved to 8.3 initially, again dropped to 6.8 this morning -Gastroenterology has been consulted -Continue clear liquid diet - Continue Protonix  40 mg IV every 12 hours - Underwent EGD, showed large hiatal hernia and Cameron erosions -Will discharge on Protonix  40 mg p.o. twice daily for 6 weeks then 40 mg daily indefinitely - Avoid aspirin or NSAIDs   Iron deficiency anemia due to GI bleed as above -Hemoglobin dropped to 6.8 , status post 1 unit PRBC -Hemoglobin improved to 8.2   Malignant neoplasm metastatic to bone (HCC) Chronic stable,followed by oncology   Hypophosphatemia - Replete   Loose stool/diarrhea -Chronic, patient takes Imodium  at home -CT  abdomen/pelvis showed no significant abnormality -Stool for C. difficile was negative -Continue Imodium  as needed,   Depression Continue Zoloft    Essential hypertension Continue home regimen   Hypokalemia - replete        Consultants: Gastroenterology Procedures performed: EGD Disposition: Home Diet recommendation:  Discharge Diet Orders (From admission, onward)     Start     Ordered   12/31/23 0000  Diet - low sodium heart healthy        12/31/23 1348           Regular diet DISCHARGE MEDICATION: Allergies as of 12/31/2023       Reactions   Ferumoxytol  Other (See Comments)   Pt had hypersensitivity to Ferumoxytol . She had Facial Flushing. See note from 07/14/2021        Medication List     STOP taking these medications    B-12 1000 MCG Tabs   hydrOXYzine  10 MG tablet Commonly known as: ATARAX        TAKE these medications    acetaminophen  500 MG tablet Commonly known as: TYLENOL  Take 1,000 mg by mouth every 6 (six) hours as needed for mild pain (pain score 1-3).   albuterol  108 (90 Base) MCG/ACT inhaler Commonly known as: VENTOLIN  HFA Inhale 2 puffs into the lungs every 6 (six) hours as needed for wheezing or shortness of breath.   capecitabine  500 MG tablet Commonly known as: XELODA  Take 3 tabs (1500 mg) by mouth every AM and 2 tablets by mouth in PM Take for 7 days, followed by a 7 day rest period. Repeat every 14 days. Take  within 30 minutes of a meal.   clobetasol  0.05 % external solution Commonly known as: TEMOVATE  Apply 1 Application topically 2 (two) times daily.   furosemide  20 MG tablet Commonly known as: LASIX  Take 1 tablet (20 mg total) by mouth daily.   ipratropium-albuterol  0.5-2.5 (3) MG/3ML Soln Commonly known as: DUONEB Take 3 mLs by nebulization every 6 (six) hours as needed.   loperamide  2 MG capsule Commonly known as: IMODIUM  Take 2 mg by mouth daily as needed for diarrhea or loose stools.   ondansetron  8 MG  disintegrating tablet Commonly known as: ZOFRAN -ODT Dissolve 1 tablet (8 mg total) by mouth every 8 (eight) hours as needed for nausea.   Otezla  30 MG Tabs Generic drug: Apremilast  Take 1 tablet (30 mg total) by mouth 2 (two) times daily.   Oyster Shell Calcium  Plus D 500-5 MG-MCG Tabs Generic drug: Calcium  Carb-Cholecalciferol  TAKE ONE TABLET BY MOUTH EVERY MORNING and TAKE ONE TABLET BY MOUTH AT NOON and TAKE ONE TABLET BY MOUTH EVERY EVENING   pantoprazole  40 MG tablet Commonly known as: PROTONIX  Take 1 tablet (40 mg total) by mouth daily. Take Protonix  40 mg twice a day for 6 weeks and then start taking once a day indefinitely What changed: additional instructions   potassium chloride  10 MEQ CR capsule Commonly known as: MICRO-K  Take 1 capsule (10 mEq total) by mouth daily.   sertraline  50 MG tablet Commonly known as: ZOLOFT  Take 1.5 tablets (75 mg total) by mouth daily. Take 75 mg by mouth daily   tacrolimus  0.1 % ointment Commonly known as: PROTOPIC  Apply topically 2 (two) times daily. Apply to affected areas of psoriasis on the days not using triamcinolone .   traMADol  50 MG tablet Commonly known as: ULTRAM  Take 1 tablet (50 mg total) by mouth every 6 (six) hours as needed for moderate pain (pain score 4-6) or severe pain (pain score 7-10).   triamcinolone  ointment 0.1 % Commonly known as: KENALOG  Apply 1 Application topically 2 (two) times daily to affected areas of psoriasis for 2 weeks, then take 2 week break before restarting.   XGEVA  [place] Inject 1 Dose into the skin every 30 (thirty) days.        Discharge Exam: Filed Weights   12/29/23 0110  Weight: 88.9 kg   General-appears in no acute distress Heart-S1-S2, regular, no murmur auscultated Lungs-clear to auscultation bilaterally, no wheezing or crackles auscultated Abdomen-soft, nontender, no organomegaly Extremities-no edema in the lower extremities Neuro-alert, oriented x3, no focal deficit  noted  Condition at discharge: good  The results of significant diagnostics from this hospitalization (including imaging, microbiology, ancillary and laboratory) are listed below for reference.   Imaging Studies: CT ABDOMEN PELVIS W CONTRAST Result Date: 12/28/2023 CLINICAL DATA:  Abdomen pain history of breast cancer EXAM: CT ABDOMEN AND PELVIS WITH CONTRAST TECHNIQUE: Multidetector CT imaging of the abdomen and pelvis was performed using the standard protocol following bolus administration of intravenous contrast. RADIATION DOSE REDUCTION: This exam was performed according to the departmental dose-optimization program which includes automated exposure control, adjustment of the mA and/or kV according to patient size and/or use of iterative reconstruction technique. CONTRAST:  OMNIPAQUE  IOHEXOL  300 MG/ML  SOLN COMPARISON:  CT 12/26/2023, 07/26/2023, 05/02/2023, 08/08/2021, 01/31/2023 FINDINGS: Lower chest: Lung bases demonstrate no acute airspace disease. Elevation of the right diaphragm with bandlike atelectasis or scarring at the right base, similar compared to prior. Moderate hiatal hernia. Hepatobiliary: Numerous hypodense liver lesions corresponding to history of metastatic disease. Lesions appear  slightly less conspicuous compared to February exam, generally smaller compared with December 2024, for example index posterior right hepatic dome lesion measures 13 x 10 mm on series 2, image 14, previously 18 x 14 mm. Distended gallbladder with multiple stones. No biliary dilatation. Lobulated liver surface as before. Pancreas: Unremarkable. No pancreatic ductal dilatation or surrounding inflammatory changes. Spleen: Normal in size without focal abnormality. Adrenals/Urinary Tract: Adrenal glands are normal. Kidneys show no hydronephrosis. The bladder is normal Stomach/Bowel: Stomach shows moderate fluid distension. There is no dilated small bowel. No acute bowel wall thickening. Vascular/Lymphatic:  Aortic atherosclerosis. No enlarged abdominal or pelvic lymph nodes. Reproductive: Uterus unremarkable. No evidence for recurrent right adnexal/ovarian mass or soft tissue. Other: Negative for pelvic effusion or free air. Musculoskeletal: Widespread skeletal metastatic disease with similar kyphotic deformity at T11 through L1. IMPRESSION: 1. No CT evidence for acute intra-abdominal or pelvic abnormality. Distended gallbladder with multiple stones but similar appearance compared with prior exams and no right upper quadrant inflammatory process. 2. Numerous hypodense liver lesions corresponding to history of metastatic disease. Lesions appear slightly less conspicuous compared to February exam, generally smaller compared with December 2024 study. 3. 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 4. Moderate hiatal hernia. 5. Aortic atherosclerosis. Aortic Atherosclerosis (ICD10-I70.0). Electronically Signed   By: Luke Bun M.D.   On: 12/28/2023 20:07    Microbiology: Results for orders placed or performed during the hospital encounter of 12/28/23  C Difficile Quick Screen w PCR reflex     Status: None   Collection Time: 12/30/23 10:29 AM   Specimen: STOOL  Result Value Ref Range Status   C Diff antigen NEGATIVE NEGATIVE Final   C Diff toxin NEGATIVE NEGATIVE Final   C Diff interpretation No C. difficile detected.  Final    Comment: Performed at [facility], [address] [place], [place] [address]   *Note: Due to a large number of results and/or encounters for the requested time period, some results have not been displayed. A complete set of results can be found in Results Review.    Labs: CBC: Recent Labs  Lab 12/28/23 1701 12/29/23 0150 12/29/23 1536 12/29/23 2133 12/30/23 0500 12/30/23 1114 12/31/23 0455  WBC 6.0   < > 7.6 6.3 5.1 4.1 4.9  NEUTROABS 4.6  --   --   --   --   --   --   HGB 7.0*   < > 8.2* 7.3* 7.2* 6.8*  8.2*  HCT 24.1*   < > 27.6* 24.2* 23.5* 22.7* 26.8*  MCV 103.0*   < > 99.3 98.4 100.0 98.7 96.1  PLT 175   < > 194 163 149* 142* 144*   < > = values in this interval not displayed.   Basic Metabolic Panel: Recent Labs  Lab 12/28/23 1701 12/29/23 0150 12/30/23 0500 12/31/23 0455  NA 141 142 140  --   K 3.5 3.4* 3.8  --   CL 107 110 108  --   CO2 24 24 23   --   GLUCOSE 124* 97 99  --   BUN 28* 27* 14  --   CREATININE 0.66 0.68 0.69  --   CALCIUM  7.8* 7.6* 7.4*  --   MG  --  1.8  --   --   PHOS  --  2.1*  --  3.1   Liver Function Tests: Recent Labs  Lab 12/28/23 1701 12/29/23 0150  AST 24 23  ALT 11  14  ALKPHOS 191* 171*  BILITOT 1.0 1.7*  PROT 5.8* 5.3*  ALBUMIN  2.6* 2.5*   CBG: No results for input(s): GLUCAP in the last 168 hours.  Discharge time spent: greater than 30 minutes.  Signed: [name], MD [facility] 12/31/2023

## 2023-12-31 NOTE — Progress Notes (Signed)
 Subjective: No further melena/hematemesis.  Objective: Vital signs in last 24 hours: Temp:  [97.6 F (36.4 C)-98.1 F (36.7 C)] 97.6 F (36.4 C) (08/05 0445) Pulse Rate:  [81-92] 90 (08/05 0445) Resp:  [18-27] 18 (08/05 0445) BP: (120-148)/(47-74) 139/62 (08/05 0445) SpO2:  [99 %-100 %] 100 % (08/05 0445) Weight change:  Last BM Date : (P) 12/31/23  PE: GEN:  NAD NEURO:  No encephalopathy ABD:  Soft, non-tender RESP:  No visible distress  Lab Results: CBC    Component Value Date/Time   WBC 4.9 12/31/2023 0455   RBC 2.79 (L) 12/31/2023 0455   HGB 8.2 (L) 12/31/2023 0455   HGB 9.3 (L) 10/31/2023 1213   HCT 26.8 (L) 12/31/2023 0455   PLT 144 (L) 12/31/2023 0455   PLT 218 10/31/2023 1213   MCV 96.1 12/31/2023 0455   MCH 29.4 12/31/2023 0455   MCHC 30.6 12/31/2023 0455   RDW 21.2 (H) 12/31/2023 0455   LYMPHSABS 0.8 12/28/2023 1701   MONOABS 0.5 12/28/2023 1701   EOSABS 0.1 12/28/2023 1701   BASOSABS 0.0 12/28/2023 1701  CMP     Component Value Date/Time   NA 140 12/30/2023 0500   K 3.8 12/30/2023 0500   CL 108 12/30/2023 0500   CO2 23 12/30/2023 0500   GLUCOSE 99 12/30/2023 0500   BUN 14 12/30/2023 0500   CREATININE 0.69 12/30/2023 0500   CREATININE 0.90 12/18/2023 0856   CALCIUM  7.4 (L) 12/30/2023 0500   PROT 5.3 (L) 12/29/2023 0150   ALBUMIN  2.5 (L) 12/29/2023 0150   AST 23 12/29/2023 0150   AST 26 12/18/2023 0856   ALT 14 12/29/2023 0150   ALT 11 12/18/2023 0856   ALKPHOS 171 (H) 12/29/2023 0150   BILITOT 1.7 (H) 12/29/2023 0150   BILITOT 1.2 12/18/2023 0856   GFRNONAA >60 12/30/2023 0500   GFRNONAA >60 12/18/2023 0856   Assessment:   Widely metastatic breast cancer. Anemia and melena, likely at least in part from large hiatal hernia and Cameron's erosions.  Plan:   Pantoprazole  40 mg po bid x 6 weeks, then 40 mg po every day indefinitely thereafter. No asa/nsaids indefinitely if clinically feasible. No further GI testing/work-up  indicated. [facility] will sign-off; please call with questions; thank you for the consultation.   Suzanne [name] 12/31/2023, 11:13 AM   Cell [phone] If no answer or after 5 PM call [phone]

## 2023-12-31 NOTE — Progress Notes (Incomplete)
 [facility]  PROGRESS NOTE  Suzanne [name] FMW:[id number] DOB: [birth year] DOA: 12/28/2023 PCP: [name], NP   Brief HPI:   63 y.o. female with medical history significant of metastatic breast cancer and  hypercalcemia of malignancy, iron deficiency anemia, upper GI bleed depression bronchitis hypertension pleural effusions microcytic anemia  Presented with   coffee-ground emesis At baseline Hg at 9 now down to 7 CT abd showed gall stones and known liver mets Hem occult neg Hx of metastatic breast cancer Spoke to [facility]  Started on Protonix  and getting transfused   Assessment/Plan:   Upper GI bleed -Presented with coffee-ground emesis -Presented with hemoglobin of 7.0, status post 2 unit PRBC -Hemoglobin improved to 8.3 initially, again dropped to 6.8 this morning -Gastroenterology has been consulted -Continue clear liquid diet - Continue Protonix  40 mg IV every 12 hours   Iron deficiency anemia due to GI bleed as above -Hemoglobin dropped to 6.8 this morning -Will transfuse 1 unit PRBC -Follow CBC in a.m.   Malignant neoplasm metastatic to bone (HCC) Chronic stable,followed by oncology  Hypophosphatemia - Replace phosphorus and check phosphorus level in a.m.    Depression Continue Zoloft    Essential hypertension Blood pressure is soft - Lasix  on hold  Hypokalemia - replete    Medications     sodium chloride    Intravenous Once   sodium chloride    Intravenous Once   sodium chloride    Intravenous Once   Gerhardt's butt cream   Topical TID   pantoprazole  (PROTONIX ) IV  40 mg Intravenous Q12H   sertraline   75 mg Oral Daily     Data Reviewed:   CBG:  No results for input(s): GLUCAP in the last 168 hours.  SpO2: 100 % O2 Flow Rate (L/min): 2 L/min    Vitals:   12/30/23 1533 12/30/23 1738 12/30/23 1939 12/31/23 0445  BP: 139/70 (!) 127/57 (!) 128/58 139/62  Pulse: 86 81 92 90  Resp:  20 20 18   Temp: 98 F (36.7 C) 98 F (36.7 C)  98.1 F (36.7 C) 97.6 F (36.4 C)  TempSrc: Oral Oral Oral Oral  SpO2: 100% 100% 100% 100%  Weight:      Height:          Data Reviewed:  Basic Metabolic Panel: Recent Labs  Lab 12/28/23 1701 12/29/23 0150 12/30/23 0500 12/31/23 0455  NA 141 142 140  --   K 3.5 3.4* 3.8  --   CL 107 110 108  --   CO2 24 24 23   --   GLUCOSE 124* 97 99  --   BUN 28* 27* 14  --   CREATININE 0.66 0.68 0.69  --   CALCIUM  7.8* 7.6* 7.4*  --   MG  --  1.8  --   --   PHOS  --  2.1*  --  3.1    CBC: Recent Labs  Lab 12/28/23 1701 12/29/23 0150 12/29/23 1536 12/29/23 2133 12/30/23 0500 12/30/23 1114 12/31/23 0455  WBC 6.0   < > 7.6 6.3 5.1 4.1 4.9  NEUTROABS 4.6  --   --   --   --   --   --   HGB 7.0*   < > 8.2* 7.3* 7.2* 6.8* 8.2*  HCT 24.1*   < > 27.6* 24.2* 23.5* 22.7* 26.8*  MCV 103.0*   < > 99.3 98.4 100.0 98.7 96.1  PLT 175   < > 194 163 149* 142* 144*   < > =  values in this interval not displayed.    LFT Recent Labs  Lab 12/28/23 1701 12/29/23 0150  AST 24 23  ALT 11 14  ALKPHOS 191* 171*  BILITOT 1.0 1.7*  PROT 5.8* 5.3*  ALBUMIN  2.6* 2.5*     Antibiotics: Anti-infectives (From admission, onward)    None        DVT prophylaxis: SCDs  Code Status: Full code  Family Communication: No family at bedside   CONSULTS gastroenterology   Subjective   Continues to have loose stools in the hospital.  EGD was remarkable for hiatal hernia and Cameron lesions.  GI has signed off.   Objective    Physical Examination:  General-appears in no acute distress Heart-S1-S2, regular, no murmur auscultated Lungs-clear to auscultation bilaterally, no wheezing or crackles auscultated Abdomen-soft, nontender, no organomegaly Extremities-no edema in the lower extremities Neuro-alert, oriented x3, no focal deficit noted  Status is: Inpatient: Yes           Suzanne [name]   [facility] If 7PM-7AM, please contact night-coverage at  [email or web address], Office  [phone]   12/31/2023, 7:32 AM  LOS: 65 days

## 2024-01-01 ENCOUNTER — Telehealth: Payer: Self-pay

## 2024-01-01 ENCOUNTER — Encounter (HOSPITAL_COMMUNITY): Payer: Self-pay | Admitting: Gastroenterology

## 2024-01-01 NOTE — Transitions of Care (Post Inpatient/ED Visit) (Signed)
   01/01/2024  Name: Suzanne [name] MRN: [id number] DOB: 1960/11/16  Today's TOC FU Call Status: Today's TOC FU Call Status:: Successful TOC FU Call Completed TOC FU Call Complete Date: 01/01/24 Patient's Name and Date of Birth confirmed.  Transition Care Management Follow-up Telephone Call Date of Discharge: 12/31/23 Discharge Facility: [facility]) Type of Discharge: Inpatient Admission Primary Inpatient Discharge Diagnosis:: Upper GI Bleed How have you been since you were released from the hospital?: Better Any questions or concerns?: No  Items Reviewed: Did you receive and understand the discharge instructions provided?: Yes Medications obtained,verified, and reconciled?: Partial Review Completed Reason for Partial Mediation Review: She stated she has all of her medications and did not have any questions about the med regime and did not need to review the med list   She also stated that she has a nebulizer. Any new allergies since your discharge?: No Dietary orders reviewed?: Yes Type of Diet Ordered:: heart healthy, low sodium Do you have support at home?: Yes People in Home [RPT]: child(ren), adult Name of Support/Comfort Primary Source: her daughter  Medications Reviewed Today: Medications Reviewed Today   Medications were not reviewed in this encounter     Home Care and Equipment/Supplies: Were Home Health Services Ordered?: No Any new equipment or medical supplies ordered?: No  Functional Questionnaire: Do you need assistance with bathing/showering or dressing?: Yes (she receives PCS 2 hrs/ day Mon-Fri) Do you need assistance with meal preparation?: Yes (her daughter assists with meal preparation) Do you need assistance with eating?: No Do you have difficulty maintaining continence: No Do you need assistance with getting out of bed/getting out of a chair/moving?: No Do you have difficulty managing or taking your medications?: No  Follow up appointments  reviewed: PCP Follow-up appointment confirmed?: Yes Date of PCP follow-up appointment?: 03/06/24 Follow-up Provider: Haze Servant, NP   I told her that she can call this clinic if she decides she would like to be seen before this appointment. Specialist Hospital Follow-up appointment confirmed?: Yes Date of Specialist follow-up appointment?: 01/16/24 Follow-Up Specialty Provider:: oncology Do you need transportation to your follow-up appointment?: No (She said she usually takes an Pharmacist, community.  I provided her with the phone number for DSS [phone] and explained that they provide tranportation to medical appts. They just need a 2-3 day notice) Do you understand care options if your condition(s) worsen?: Yes-patient verbalized understanding    SIGNATURE Slater Diesel, RN

## 2024-01-02 ENCOUNTER — Other Ambulatory Visit: Payer: Self-pay

## 2024-01-02 ENCOUNTER — Other Ambulatory Visit: Payer: Self-pay | Admitting: Pharmacist

## 2024-01-02 ENCOUNTER — Other Ambulatory Visit (HOSPITAL_COMMUNITY): Payer: Self-pay

## 2024-01-02 NOTE — Progress Notes (Signed)
 Specialty Pharmacy Ongoing Clinical Assessment Note  Suzanne [name] is a 63 y.o. female who is being followed by the specialty pharmacy service for RxSp Oncology   Patient's specialty medication(s) reviewed today: Capecitabine  (XELODA )   Missed doses in the last 4 weeks: 0   Patient/Caregiver did not have any additional questions or concerns.   Therapeutic benefit summary: Patient is achieving benefit   Adverse events/side effects summary: Experienced adverse events/side effects (She reports improving diarrhea and changes in the color of her psoriasis patches. She currently denies any hand-foot symptoms and is proactively moisturizing as a preventive measure.)   Patient's therapy is appropriate to: Continue    Goals Addressed             This Visit's Progress    Slow Disease Progression       Patient is unable to be assessed as therapy was recently initiated. Patient will maintain adherence.  Per [name]'s office visit notes from 12/18/23, we are awaiting a CT scan to determine if the disease is progressing.           Follow up: 3 months  Silvano [name] Specialty Pharmacist   Clinical Intervention Note  Clinical Intervention Notes: Patient reports that her pantoprazole  dose was increased to BID after her hospital stay.  There is documentation of a moderate interaction with Xeloda , as it can decrease absorption, I reached out to the clinical pharmacist, Norleen Parkinson, PharmD who reports that this is not a concerning interaction.   Clinical Intervention Outcomes: Prevention of an adverse drug event   Silvano [name] Karel Santa

## 2024-01-07 ENCOUNTER — Encounter: Payer: Self-pay | Admitting: Hematology and Oncology

## 2024-01-09 ENCOUNTER — Encounter: Payer: Self-pay | Admitting: Hematology and Oncology

## 2024-01-10 ENCOUNTER — Encounter: Payer: Self-pay | Admitting: Hematology and Oncology

## 2024-01-14 ENCOUNTER — Encounter: Payer: Self-pay | Admitting: Hematology and Oncology

## 2024-01-16 ENCOUNTER — Inpatient Hospital Stay

## 2024-01-16 ENCOUNTER — Inpatient Hospital Stay (HOSPITAL_BASED_OUTPATIENT_CLINIC_OR_DEPARTMENT_OTHER): Admitting: Hematology and Oncology

## 2024-01-16 ENCOUNTER — Other Ambulatory Visit: Payer: Self-pay | Admitting: Pharmacy Technician

## 2024-01-16 ENCOUNTER — Other Ambulatory Visit: Payer: Self-pay

## 2024-01-16 ENCOUNTER — Other Ambulatory Visit: Payer: Self-pay | Admitting: Hematology and Oncology

## 2024-01-16 ENCOUNTER — Encounter: Payer: Self-pay | Admitting: Hematology and Oncology

## 2024-01-16 ENCOUNTER — Inpatient Hospital Stay: Attending: Adult Health

## 2024-01-16 VITALS — BP 141/73 | HR 107 | Temp 98.2°F | Resp 19 | Wt 179.2 lb

## 2024-01-16 DIAGNOSIS — C50811 Malignant neoplasm of overlapping sites of right female breast: Secondary | ICD-10-CM | POA: Diagnosis present

## 2024-01-16 DIAGNOSIS — C7951 Secondary malignant neoplasm of bone: Secondary | ICD-10-CM | POA: Diagnosis present

## 2024-01-16 DIAGNOSIS — Z170 Estrogen receptor positive status [ER+]: Secondary | ICD-10-CM | POA: Diagnosis present

## 2024-01-16 DIAGNOSIS — Z803 Family history of malignant neoplasm of breast: Secondary | ICD-10-CM | POA: Diagnosis not present

## 2024-01-16 DIAGNOSIS — Z79899 Other long term (current) drug therapy: Secondary | ICD-10-CM | POA: Insufficient documentation

## 2024-01-16 DIAGNOSIS — D630 Anemia in neoplastic disease: Secondary | ICD-10-CM | POA: Insufficient documentation

## 2024-01-16 DIAGNOSIS — Z800 Family history of malignant neoplasm of digestive organs: Secondary | ICD-10-CM | POA: Insufficient documentation

## 2024-01-16 DIAGNOSIS — C50819 Malignant neoplasm of overlapping sites of unspecified female breast: Secondary | ICD-10-CM

## 2024-01-16 LAB — CMP (CANCER CENTER ONLY)
ALT: 13 U/L (ref 0–44)
AST: 25 U/L (ref 15–41)
Albumin: 3.5 g/dL (ref 3.5–5.0)
Alkaline Phosphatase: 181 U/L — ABNORMAL HIGH (ref 38–126)
Anion gap: 5 (ref 5–15)
BUN: 13 mg/dL (ref 8–23)
CO2: 31 mmol/L (ref 22–32)
Calcium: 8.4 mg/dL — ABNORMAL LOW (ref 8.9–10.3)
Chloride: 104 mmol/L (ref 98–111)
Creatinine: 0.8 mg/dL (ref 0.44–1.00)
GFR, Estimated: >60 mL/min (ref >60)
Glucose, Bld: 116 mg/dL — ABNORMAL HIGH (ref 70–99)
Potassium: 3.6 mmol/L (ref 3.5–5.1)
Sodium: 140 mmol/L (ref 135–145)
Total Bilirubin: 1.1 mg/dL (ref 0.0–1.2)
Total Protein: 6 g/dL — ABNORMAL LOW (ref 6.5–8.1)

## 2024-01-16 LAB — CBC WITH DIFFERENTIAL/PLATELET
Abs Immature Granulocytes: 0.01 K/uL (ref 0.00–0.07)
Basophils Absolute: 0 K/uL (ref 0.0–0.1)
Basophils Relative: 1 %
Eosinophils Absolute: 0.1 K/uL (ref 0.0–0.5)
Eosinophils Relative: 3 %
HCT: 31 % — ABNORMAL LOW (ref 36.0–46.0)
Hemoglobin: 10 g/dL — ABNORMAL LOW (ref 12.0–15.0)
Immature Granulocytes: 0 %
Lymphocytes Relative: 14 %
Lymphs Abs: 0.5 K/uL — ABNORMAL LOW (ref 0.7–4.0)
MCH: 30.5 pg (ref 26.0–34.0)
MCHC: 32.3 g/dL (ref 30.0–36.0)
MCV: 94.5 fL (ref 80.0–100.0)
Monocytes Absolute: 0.3 K/uL (ref 0.1–1.0)
Monocytes Relative: 9 %
Neutro Abs: 2.9 K/uL (ref 1.7–7.7)
Neutrophils Relative %: 73 %
Platelets: 160 K/uL (ref 150–400)
RBC: 3.28 MIL/uL — ABNORMAL LOW (ref 3.87–5.11)
RDW: 18.3 % — ABNORMAL HIGH (ref 11.5–15.5)
WBC: 3.9 K/uL — ABNORMAL LOW (ref 4.0–10.5)
nRBC: 0 % (ref 0.0–0.2)

## 2024-01-16 MED ORDER — CAPECITABINE 500 MG PO TABS
ORAL_TABLET | ORAL | 4 refills | Status: DC
  Filled 2024-01-16: qty 35, 14d supply, fill #0
  Filled 2024-01-28: qty 35, 14d supply, fill #1
  Filled 2024-02-07: qty 35, 14d supply, fill #2
  Filled 2024-02-20: qty 35, 14d supply, fill #3
  Filled 2024-03-04: qty 35, 14d supply, fill #4

## 2024-01-16 NOTE — Progress Notes (Signed)
 [facility] Cancer Follow up:    [name], NP [address] [place] [address]   DIAGNOSIS:  Cancer Staging  Carcinoma of breast, estrogen receptor positive, stage 4, right (HCC) Staging form: Breast, AJCC 8th Edition - Clinical: Stage IV (pM1) - Signed by Crawford Morna Pickle, NP on 02/15/2023    SUMMARY OF ONCOLOGIC HISTORY: Oncology History  Malignant neoplasm metastatic to bone [facility])  Carcinoma of breast, estrogen receptor positive, stage 4, right (HCC)  11/16/2018 Initial Diagnosis   Carcinoma of breast, estrogen receptor positive, stage 4, right (HCC)   02/15/2023 Cancer Staging   Staging form: Breast, AJCC 8th Edition - Clinical: Stage IV (pM1) - Signed by Crawford Morna Pickle, NP on 02/15/2023     CURRENT THERAPY: Xeloda   INTERVAL HISTORY:  Discussed the use of AI scribe software for clinical note transcription with the patient, who gave verbal consent to proceed.  History of Present Illness  Suzanne [name] is a 63 year old female with metastatic cancer who presents for follow-up on her current treatment regimen.  She is currently on Xeloda , taking three pills in the morning and two at night, with a schedule of one week on and one week off. She tolerates this dosage well, and her tumor markers have been decreasing. She reports no pain in the wing bone or sternum.  She was hospitalized for three days following a CT scan due to gastrointestinal issues, including an upper GI bleed. She experienced hematemesis and required blood transfusions and Protonix . An EGD revealed lesions and hiatal hernia. She was previously on Protonix  once a day, but it was increased to twice a day during hospitalization. There is concern from a specialty pharmacist that this may reduce the efficacy of Xeloda , but this has not been confirmed.  Her calcium  levels were previously high, leading to a temporary cessation of calcium  supplements. Her current  calcium  level is 8.4, and she was taking 500 mg twice a day, though it was prescribed as three times a day.  She is temporarily off psoriasis medication due to insurance approval issues with the dermatologist. She was scheduled to receive Xgeva , but due to low calcium  levels, this was postponed.   Patient Active Problem List   Diagnosis Date Noted   Hypercalcemia of malignancy 11/26/2023   Iron deficiency anemia 09/22/2023   Upper GI bleed 05/27/2022   Acute prerenal azotemia 04/18/2022   Dehydration 04/18/2022   SIRS (systemic inflammatory response syndrome) (HCC) 04/18/2022   Allergic rhinitis 04/18/2022   Depression 04/18/2022   Anemia of chronic disease 04/18/2022   Bronchitis 04/18/2022   Acute bronchitis 04/17/2022   Intertrigo 01/10/2022   Bacterial vaginitis 01/10/2022   Sepsis due to cellulitis (HCC) 12/06/2021   B12 deficiency anemia 08/15/2020   Sensorineural hearing loss (SNHL) of both ears 08/07/2019   Intractable back pain 02/03/2019   Thoracic compression fracture, closed, initial encounter (HCC) 02/03/2019   Essential hypertension 02/03/2019   Genetic testing 01/27/2019   Acute pharyngitis    Bacteremia    FTT (failure to thrive) in adult    Fracture closed, humerus, shaft 01/09/2019   Pneumonia 12/25/2018   Acute respiratory disorder in immunocompromised patient (HCC) 12/25/2018   Family history of breast cancer    Family history of prostate cancer    Family history of melanoma    Family history of colon cancer    Pressure injury of skin 11/24/2018   Chronic respiratory failure with hypoxia and hypercapnia (  HCC) 11/19/2018   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   Hypophosphatemia 11/19/2018   Hypocalcemia 11/19/2018   Aspiration pneumonia (HCC) 11/19/2018   SOB (shortness of breath)    Palliative care by specialist    Carcinoma of breast, estrogen receptor positive, stage 4, right (HCC) 11/16/2018   Hypoalbuminemia 11/16/2018   Pleural effusion  11/14/2018   Goals of care, counseling/discussion 11/06/2018   Malignant 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  MEDICAL HISTORY: Past Medical History:  Diagnosis Date   Anemia    Cancer (HCC)    Family history of breast cancer    Family history of colon cancer    Family history of melanoma    Family history of prostate cancer    Gallstones    History of hiatal hernia    History of kidney stones    Mastitis    reports history of recurrent mastitis   Metastatic breast cancer    Obesity    Psoriasis     SURGICAL HISTORY: Past Surgical History:  Procedure Laterality Date   CYSTOSCOPY/URETEROSCOPY/HOLMIUM LASER/STENT PLACEMENT Left 10/17/2022   Procedure: CYSTOSCOPY, LEFT RETROGRADE PYELOGRAM, LEFT URETEROSCOPY, HOLMIUM LASER LITHOTRIPSY, BASKET EXTRACTION, AND LEFT URETERAL STENT PLACEMENT;  Surgeon: Devere Lonni Righter, MD;  Location: [facility];  Service: Urology;  Laterality: Left;  45 MINUTES   ESOPHAGOGASTRODUODENOSCOPY N/A 12/30/2023   Procedure: EGD (ESOPHAGOGASTRODUODENOSCOPY);  Surgeon: Burnette Fallow, MD;  Location: [facility];  Service: Gastroenterology;  Laterality: N/A;   ESOPHAGOGASTRODUODENOSCOPY (EGD) WITH PROPOFOL  N/A 05/27/2022   Procedure: ESOPHAGOGASTRODUODENOSCOPY (EGD) WITH PROPOFOL ;  Surgeon: Burnette Fallow, MD;  Location: [facility];  Service: Gastroenterology;  Laterality: N/A;   EYE SURGERY     cataract   GLAUCOMA SURGERY  late 1990's   IR THORACENTESIS ASP PLEURAL SPACE W/IMG GUIDE  10/28/2018   IR THORACENTESIS ASP PLEURAL SPACE W/IMG GUIDE  10/29/2018   OPEN REDUCTION INTERNAL FIXATION (ORIF)  DISTAL RADIAL FRACTURE Left 01/09/2019   Procedure: OPEN REDUCTION INTERNAL FIXATION (ORIF) HUMERAL FRACTURE;  Surgeon: Kay Kemps, MD;  Location: [facility];  Service: Orthopedics;  Laterality: Left;    SOCIAL HISTORY: Social History   Socioeconomic History   Marital status: Widowed    Spouse name: Not on file   Number of children: 1   Years of education: Not on file   Highest education level: Not on file  Occupational History   Occupation: Visual merchandiser Programs    Comment: Self-employed  Tobacco Use   Smoking status: Never   Smokeless tobacco: Never   Tobacco comments:    second hand smoke exposure  Vaping Use   Vaping status: Never Used  Substance and Sexual Activity   Alcohol use: No   Drug use: No   Sexual activity: Not Currently  Other Topics Concern   Not on file  Social History Narrative   Has a daughter named [name]. Daughter has CP.   Social Drivers of Corporate investment banker Strain: Low Risk  (09/17/2022)   Overall Financial Resource Strain (CARDIA)    Difficulty of Paying Living Expenses: Not hard at all  Food Insecurity: No Food Insecurity (12/29/2023)   Hunger Vital Sign    Worried About Running Out of Food in the Last Year: Never true    Ran Out of Food in the Last Year: Never true  Transportation Needs: No Transportation Needs (12/29/2023)   PRAPARE - Administrator, Civil Service (Medical): No    Lack of Transportation (Non-Medical): No  Physical Activity: Inactive (03/07/2023)   Exercise Vital Sign    Days of Exercise per Week: 0 days    Minutes of Exercise per Session: 0 min  Stress: Stress Concern Present (03/07/2023)   [employer] of Occupational Health - Occupational Stress Questionnaire    Feeling of Stress : To some extent  Social Connections: Socially Isolated (03/07/2023)   Social Connection and Isolation Panel    Frequency of Communication with Friends and Family: Once a week    Frequency of Social Gatherings  with Friends and Family: Once a week    Attends Religious Services: Never    Database administrator or Organizations: Yes    Attends Banker Meetings: Never    Marital Status: Widowed  Intimate Partner Violence: Not At Risk (12/29/2023)   Humiliation, Afraid, Rape, and Kick questionnaire    Fear of Current or Ex-Partner: No    Emotionally Abused: No    Physically Abused: No    Sexually Abused: No    FAMILY HISTORY: Family History  Problem Relation Age of Onset   Breast cancer Sister        in her 93's-70s   Heart disease Brother        CABG   Heart disease Brother        CABG   Skin cancer Brother        melanoma   Colon cancer Cousin    Heart attack Mother    Heart attack Father    Prostate cancer Brother        PHYSICAL EXAMINATION   Onc Performance Status - 01/16/24 1300       KPS SCALE   KPS % SCORE Requires occasional assistance but is able to care for most needs          Vitals:   01/16/24 1320  BP: (!) 141/73  Pulse: (!) 107  Resp: 19  Temp: 98.2 F (36.8 C)  SpO2: 100%    Physical Exam Constitutional:      General: She is not in acute distress.    Appearance: Normal appearance. She is not toxic-appearing.     Comments: Examined in wheelchair  HENT:     Head: Normocephalic and atraumatic.     Mouth/Throat:     Mouth: Mucous membranes are moist.     Pharynx: Oropharynx is clear. No oropharyngeal exudate or posterior oropharyngeal erythema.  Eyes:     General: No scleral icterus. Cardiovascular:     Rate and Rhythm: Normal rate and regular rhythm.     Pulses: Normal pulses.     Heart sounds: Normal heart sounds.  Pulmonary:     Effort: Pulmonary effort is normal.     Breath sounds: Normal breath sounds.  Abdominal:     General: Abdomen is flat. Bowel sounds are normal. There is no distension.     Palpations: Abdomen is soft.     Tenderness: There is no abdominal tenderness.  Musculoskeletal:        General: No swelling.      Cervical back: Neck supple.  Lymphadenopathy:  Cervical: No cervical adenopathy.  Skin:    General: Skin is warm and dry.     Findings: No rash.  Neurological:     General: No focal deficit present.     Mental Status: She is alert.  Psychiatric:        Mood and Affect: Mood normal.        Behavior: Behavior normal.     LABORATORY DATA:  CBC    Component Value Date/Time   WBC 3.9 (L) 01/16/2024 1252   RBC 3.28 (L) 01/16/2024 1252   HGB 10.0 (L) 01/16/2024 1252   HGB 9.3 (L) 10/31/2023 1213   HCT 31.0 (L) 01/16/2024 1252   PLT 160 01/16/2024 1252   PLT 218 10/31/2023 1213   MCV 94.5 01/16/2024 1252   MCH 30.5 01/16/2024 1252   MCHC 32.3 01/16/2024 1252   RDW 18.3 (H) 01/16/2024 1252   LYMPHSABS 0.5 (L) 01/16/2024 1252   MONOABS 0.3 01/16/2024 1252   EOSABS 0.1 01/16/2024 1252   BASOSABS 0.0 01/16/2024 1252    CMP     Component Value Date/Time   NA 140 01/16/2024 1252   K 3.6 01/16/2024 1252   CL 104 01/16/2024 1252   CO2 31 01/16/2024 1252   GLUCOSE 116 (H) 01/16/2024 1252   BUN 13 01/16/2024 1252   CREATININE 0.80 01/16/2024 1252   CALCIUM  8.4 (L) 01/16/2024 1252   PROT 6.0 (L) 01/16/2024 1252   ALBUMIN  3.5 01/16/2024 1252   AST 25 01/16/2024 1252   ALT 13 01/16/2024 1252   ALKPHOS 181 (H) 01/16/2024 1252   BILITOT 1.1 01/16/2024 1252   GFRNONAA >60 01/16/2024 1252   GFRAA >60 02/24/2020 1454   GFRAA >60 07/24/2019 1453     ASSESSMENT and THERAPY PLAN:   Assessment & Plan  Metastatic breast cancer with bone involvement On Xeloda  with decreasing tumor markers. Worsening in wing bone with larger lytic lesion, no pain.  Recent GI hospitalization unrelated to cancer treatment. - Continue Xeloda  regimen current dose - continue to monitor metastatic disease with periodic imaging. - Follow up with pharmacy for Xeloda  prescription issues.  Hypocalcemia Calcium  level at 8.4, previously stopped supplementation due to high levels, now advised to  resume. - Resume calcium  supplementation at 500 mg twice daily. - Reassess calcium  levels next month.  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 Recent GI bleed required hospitalization and transfusion. Protonix  taken twice daily, awaiting pharmacist confirmation on interaction with Xeloda . - Continue Protonix  twice daily for 45 days. - Monitor hemoglobin levels. - Follow up with pharmacist regarding Protonix  and Xeloda  interaction.    All questions were answered. The patient knows to call the clinic with any problems, questions or concerns. We can certainly see the patient much sooner if necessary.  Total encounter time:30 minutes*in face-to-face visit time, chart review, lab review, care coordination, order entry, and documentation of the encounter time.  *Total Encounter Time as defined by the Centers for Medicare and Medicaid Services includes, in addition to the face-to-face time of a patient visit (documented in the note above) non-face-to-face time: obtaining and reviewing outside history, ordering and reviewing medications, tests or procedures, care coordination (communications with other health care professionals or caregivers) and documentation in the medical record.

## 2024-01-16 NOTE — Progress Notes (Signed)
 Specialty Pharmacy Refill Coordination Note  Suzanne [name] is a 63 y.o. female contacted today regarding refills of specialty medication(s) Capecitabine  (XELODA )   Patient requested Delivery   Delivery date: 01/17/24   Verified address: [address] [place]   Medication will be filled on 01/17/24.

## 2024-01-17 ENCOUNTER — Telehealth: Payer: Self-pay

## 2024-01-17 ENCOUNTER — Other Ambulatory Visit: Payer: Self-pay

## 2024-01-17 LAB — CANCER ANTIGEN 27.29: CA 27.29: 406 U/mL — ABNORMAL HIGH (ref 0.0–38.6)

## 2024-01-17 NOTE — Telephone Encounter (Signed)
 Pt's daughter called regarding need for Xeloda  refill as well as advice for calcium  supplementation. S/w pt and advised refill was sent yesterday and Dr Loretha is recommending calcium  supplement as her Ca is 8.4. She is agreeable and knows to call with any other concerns.

## 2024-01-19 ENCOUNTER — Telehealth: Payer: Self-pay

## 2024-01-19 NOTE — Telephone Encounter (Signed)
 Oncology Pharmacist Encounter   I received a message from Dr. Loretha while covering for Norleen, asking if there is an interaction with the xeloda  and protonix  as patient told Dr. Loretha that one of our specialty pharmacists had told the patient this. I notified Dr. Loretha that there is an interaction between xeloda  and any proton pump inhibitors that PPIs may decrease the therapeutic effects of xeloda . They do not know the extent of this interaction but it is recommended to change to famotidine  if possible. Usually this is dependent on the indication that the PPI is being used for. If there is need for the two medications to be combined, it is recommended to monitor for any evidence that there is a reduced xeloda  effect.   Due to patient being on twice daily protonix  due to GI bleed, patient needs to continue the PPI. Notified patient after discussion with Dr. Loretha that she should continue PPI twice a day for atleast 6 weeks from the most recent hospitalization, after that she can move on to once a day. Patient agrees with plan and understands. Patient was thankful for the phone call.    Suzanne [name], PharmD Hematology/Oncology Clinical Pharmacist [facility] [phone]

## 2024-01-23 ENCOUNTER — Other Ambulatory Visit (HOSPITAL_COMMUNITY): Payer: Self-pay

## 2024-01-23 ENCOUNTER — Other Ambulatory Visit: Payer: Self-pay

## 2024-01-23 ENCOUNTER — Other Ambulatory Visit: Payer: Self-pay | Admitting: Adult Health

## 2024-01-24 ENCOUNTER — Other Ambulatory Visit: Payer: Self-pay

## 2024-01-24 ENCOUNTER — Other Ambulatory Visit: Payer: Self-pay | Admitting: *Deleted

## 2024-01-24 ENCOUNTER — Other Ambulatory Visit (HOSPITAL_COMMUNITY): Payer: Self-pay

## 2024-01-24 ENCOUNTER — Encounter: Payer: Self-pay | Admitting: Hematology and Oncology

## 2024-01-24 DIAGNOSIS — N3000 Acute cystitis without hematuria: Secondary | ICD-10-CM

## 2024-01-24 MED ORDER — FUROSEMIDE 20 MG PO TABS
20.0000 mg | ORAL_TABLET | Freq: Every day | ORAL | 4 refills | Status: AC
  Filled 2024-01-24: qty 90, 90d supply, fill #0
  Filled 2024-05-25: qty 90, 90d supply, fill #1

## 2024-01-24 MED ORDER — POTASSIUM CHLORIDE ER 10 MEQ PO CPCR
10.0000 meq | ORAL_CAPSULE | Freq: Every day | ORAL | 4 refills | Status: AC
  Filled 2024-01-24: qty 90, 90d supply, fill #0
  Filled 2024-05-10: qty 90, 90d supply, fill #1

## 2024-01-24 MED ORDER — CIPROFLOXACIN HCL 500 MG PO TABS
500.0000 mg | ORAL_TABLET | Freq: Two times a day (BID) | ORAL | 0 refills | Status: AC
Start: 2024-01-24 — End: ?
  Filled 2024-01-24: qty 14, 7d supply, fill #0

## 2024-01-24 NOTE — Telephone Encounter (Signed)
 Received call from pt's daughter stating that mother thinks she has a UTI & asking should she come here or to PCP for u/a.  Pt unable to come today due to having to get a ride.  Neither pt or daughter drives.  She reports symptoms of urgency & voiding small amts & some burning.  No blood reported.  Tues would be earliest we could get her in for U/A so encouraged to drink lots of fluids, cranberry juice to see If symptoms subside.  She wants to come in Tues for u/a.  Informed to go to urgent care if symptoms worse or not improved. M\essage to scheduler.

## 2024-01-28 ENCOUNTER — Other Ambulatory Visit: Payer: Self-pay

## 2024-01-28 ENCOUNTER — Inpatient Hospital Stay

## 2024-01-28 NOTE — Progress Notes (Signed)
 Specialty Pharmacy Refill Coordination Note  Suzanne [name] is a 64 y.o. female contacted today regarding refills of specialty medication(s) Capecitabine  (XELODA )   Patient requested Delivery   Delivery date: 01/30/24   Verified address: [address] [place]   Medication will be filled on 09.03.25.   Next cycle starts 9.4.25 but patient has some medication left on hand to start

## 2024-01-29 ENCOUNTER — Other Ambulatory Visit: Payer: Self-pay

## 2024-02-05 ENCOUNTER — Encounter: Payer: Self-pay | Admitting: Hematology and Oncology

## 2024-02-05 ENCOUNTER — Other Ambulatory Visit (HOSPITAL_COMMUNITY): Payer: Self-pay

## 2024-02-07 ENCOUNTER — Other Ambulatory Visit: Payer: Self-pay

## 2024-02-07 ENCOUNTER — Encounter: Payer: Self-pay | Admitting: Hematology and Oncology

## 2024-02-07 NOTE — Progress Notes (Signed)
 Specialty Pharmacy Refill Coordination Note  Suzanne [name] is a 63 y.o. female contacted today regarding refills of specialty medication(s) Capecitabine  (XELODA )   Patient requested Delivery   Delivery date: 02/11/24   Verified address: [address] [place]   Medication will be filled on 09.15.25.

## 2024-02-13 ENCOUNTER — Inpatient Hospital Stay: Attending: Adult Health

## 2024-02-13 ENCOUNTER — Inpatient Hospital Stay: Admitting: Hematology and Oncology

## 2024-02-13 ENCOUNTER — Inpatient Hospital Stay

## 2024-02-17 ENCOUNTER — Other Ambulatory Visit: Payer: Self-pay

## 2024-02-19 ENCOUNTER — Other Ambulatory Visit (HOSPITAL_COMMUNITY): Payer: Self-pay

## 2024-02-20 ENCOUNTER — Other Ambulatory Visit (HOSPITAL_COMMUNITY): Payer: Self-pay

## 2024-02-24 ENCOUNTER — Other Ambulatory Visit: Payer: Self-pay | Admitting: Pharmacy Technician

## 2024-02-24 ENCOUNTER — Other Ambulatory Visit: Payer: Self-pay

## 2024-02-24 NOTE — Progress Notes (Signed)
 Specialty Pharmacy Refill Coordination Note  Suzanne [name] is a 63 y.o. female contacted today regarding refills of specialty medication(s) Capecitabine  (XELODA )   Patient requested Delivery   Delivery date: 02/25/24   Verified address: [address]   [place] [id number]   Medication will be filled on 02/24/24. Patient currently on 7 day break now will resume medication on 02/26/24.

## 2024-02-27 ENCOUNTER — Inpatient Hospital Stay: Admitting: Hematology and Oncology

## 2024-02-27 ENCOUNTER — Inpatient Hospital Stay: Attending: Adult Health

## 2024-02-27 ENCOUNTER — Inpatient Hospital Stay

## 2024-02-27 VITALS — BP 141/61 | HR 88 | Resp 18 | Ht 59.0 in | Wt 186.0 lb

## 2024-02-27 DIAGNOSIS — C7951 Secondary malignant neoplasm of bone: Secondary | ICD-10-CM

## 2024-02-27 DIAGNOSIS — C50811 Malignant neoplasm of overlapping sites of right female breast: Secondary | ICD-10-CM | POA: Insufficient documentation

## 2024-02-27 DIAGNOSIS — C50819 Malignant neoplasm of overlapping sites of unspecified female breast: Secondary | ICD-10-CM

## 2024-02-27 DIAGNOSIS — D500 Iron deficiency anemia secondary to blood loss (chronic): Secondary | ICD-10-CM

## 2024-02-27 DIAGNOSIS — Z170 Estrogen receptor positive status [ER+]: Secondary | ICD-10-CM | POA: Diagnosis present

## 2024-02-27 LAB — CBC WITH DIFFERENTIAL/PLATELET
Abs Immature Granulocytes: 0.02 K/uL (ref 0.00–0.07)
Basophils Absolute: 0 K/uL (ref 0.0–0.1)
Basophils Relative: 1 %
Eosinophils Absolute: 0.2 K/uL (ref 0.0–0.5)
Eosinophils Relative: 4 %
HCT: 29.3 % — ABNORMAL LOW (ref 36.0–46.0)
Hemoglobin: 9.2 g/dL — ABNORMAL LOW (ref 12.0–15.0)
Immature Granulocytes: 1 %
Lymphocytes Relative: 14 %
Lymphs Abs: 0.5 K/uL — ABNORMAL LOW (ref 0.7–4.0)
MCH: 29.3 pg (ref 26.0–34.0)
MCHC: 31.4 g/dL (ref 30.0–36.0)
MCV: 93.3 fL (ref 80.0–100.0)
Monocytes Absolute: 0.4 K/uL (ref 0.1–1.0)
Monocytes Relative: 11 %
Neutro Abs: 2.8 K/uL (ref 1.7–7.7)
Neutrophils Relative %: 69 %
Platelets: 135 K/uL — ABNORMAL LOW (ref 150–400)
RBC: 3.14 MIL/uL — ABNORMAL LOW (ref 3.87–5.11)
RDW: 21.3 % — ABNORMAL HIGH (ref 11.5–15.5)
WBC: 3.9 K/uL — ABNORMAL LOW (ref 4.0–10.5)
nRBC: 0 % (ref 0.0–0.2)

## 2024-02-27 LAB — CMP (CANCER CENTER ONLY)
ALT: 11 U/L (ref 0–44)
AST: 25 U/L (ref 15–41)
Albumin: 3.4 g/dL — ABNORMAL LOW (ref 3.5–5.0)
Alkaline Phosphatase: 148 U/L — ABNORMAL HIGH (ref 38–126)
Anion gap: 5 (ref 5–15)
BUN: 15 mg/dL (ref 8–23)
CO2: 31 mmol/L (ref 22–32)
Calcium: 9.2 mg/dL (ref 8.9–10.3)
Chloride: 105 mmol/L (ref 98–111)
Creatinine: 0.97 mg/dL (ref 0.44–1.00)
GFR, Estimated: >60 mL/min (ref >60)
Glucose, Bld: 105 mg/dL — ABNORMAL HIGH (ref 70–99)
Potassium: 3.3 mmol/L — ABNORMAL LOW (ref 3.5–5.1)
Sodium: 141 mmol/L (ref 135–145)
Total Bilirubin: 1.1 mg/dL (ref 0.0–1.2)
Total Protein: 6.3 g/dL — ABNORMAL LOW (ref 6.5–8.1)

## 2024-02-27 MED ORDER — DENOSUMAB 120 MG/1.7ML ~~LOC~~ SOLN
120.0000 mg | Freq: Once | SUBCUTANEOUS | Status: AC
  Administered 2024-02-27: 120 mg via SUBCUTANEOUS
  Filled 2024-02-27: qty 1.7

## 2024-02-27 NOTE — Progress Notes (Signed)
 [facility] Cancer Follow up:    Suzanne [name] ORN, NP [address] [place] [address]   DIAGNOSIS:  Cancer Staging  Carcinoma of breast, estrogen receptor positive, stage 4, right (HCC) Staging form: Breast, AJCC 8th Edition - Clinical: Stage IV (pM1) - Signed by Suzanne Morna Pickle, NP on 02/15/2023    SUMMARY OF ONCOLOGIC HISTORY: Oncology History  Malignant neoplasm metastatic to bone [facility])  Carcinoma of breast, estrogen receptor positive, stage 4, right (HCC)  11/16/2018 Initial Diagnosis   Carcinoma of breast, estrogen receptor positive, stage 4, right (HCC)   02/15/2023 Cancer Staging   Staging form: Breast, AJCC 8th Edition - Clinical: Stage IV (pM1) - Signed by Suzanne Morna Pickle, NP on 02/15/2023     CURRENT THERAPY: Xeloda   INTERVAL HISTORY:  Discussed the use of AI scribe software for clinical note transcription with the patient, who gave verbal consent to proceed.  History of Present Illness   Suzanne [name] is a 63 year old female with metastatic cancer who presents for follow-up regarding her treatment regimen.  She is currently on Xeloda , taking three pills in the morning and two at night, with a schedule of one week on and one week off. She reports no new changes in her symptoms since the dosage was adjusted. She reports that her bone pain and breathing are about the same as before.  She experienced a gastrointestinal bleed in the past and is currently taking Protonix  once a day for maintenance.  She is awaiting a refill of her Otezla  prescription and has experienced a flare in her psoriasis, which she attributes to the heat and the delay in obtaining her medication. She reports that her rash has worsened, and she has been unable to see the dermatologist to renew her prescription.  She has not received the latest COVID booster, with her last COVID vaccine administered on January 10, 2022.  She has been receiving  Xgeva  injections, but the last two were withheld due to low calcium  levels.   Patient Active Problem List   Diagnosis Date Noted   Hypercalcemia of malignancy 11/26/2023   Iron deficiency anemia 09/22/2023   Upper GI bleed 05/27/2022   Acute prerenal azotemia 04/18/2022   Dehydration 04/18/2022   SIRS (systemic inflammatory response syndrome) (HCC) 04/18/2022   Allergic rhinitis 04/18/2022   Depression 04/18/2022   Anemia of chronic disease 04/18/2022   Bronchitis 04/18/2022   Acute bronchitis 04/17/2022   Intertrigo 01/10/2022   Bacterial vaginitis 01/10/2022   Sepsis due to cellulitis (HCC) 12/06/2021   B12 deficiency anemia 08/15/2020   Sensorineural hearing loss (SNHL) of both ears 08/07/2019   Intractable back pain 02/03/2019   Thoracic compression fracture, closed, initial encounter (HCC) 02/03/2019   Essential hypertension 02/03/2019   Genetic testing 01/27/2019   Acute pharyngitis    Bacteremia    FTT (failure to thrive) in adult    Fracture closed, humerus, shaft 01/09/2019   Pneumonia 12/25/2018   Acute respiratory disorder in immunocompromised patient 12/25/2018   Family history of breast cancer    Family history of prostate cancer    Family history of melanoma    Family history of colon cancer    Pressure injury of skin 11/24/2018   Chronic respiratory failure with hypoxia and hypercapnia (HCC) 11/19/2018   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   Hypophosphatemia 11/19/2018   Hypocalcemia 11/19/2018   Aspiration pneumonia (HCC) 11/19/2018   SOB (shortness of breath)    Palliative  care by specialist    Carcinoma of breast, estrogen receptor positive, stage 4, right (HCC) 11/16/2018   Hypoalbuminemia 11/16/2018   Pleural effusion 11/14/2018   Goals of care, counseling/discussion 11/06/2018   Malignant neoplasm metastatic to lung (HCC) 10/31/2018   Pain from bone metastases (HCC) 10/31/2018   Class 2 obesity 10/31/2018   Bone injury    Pleural  effusion, bilateral    Shortness of breath    Abnormal breast finding    Hypokalemia    Breast skin changes    Macrocytic anemia    Malignant neoplasm metastatic to bone (HCC)    Anemia, B12 deficiency 10/26/2018    is allergic to ferumoxytol .  MEDICAL HISTORY: Past Medical History:  Diagnosis Date   Anemia    Cancer (HCC)    Family history of breast cancer    Family history of colon cancer    Family history of melanoma    Family history of prostate cancer    Gallstones    History of hiatal hernia    History of kidney stones    Mastitis    reports history of recurrent mastitis   Metastatic breast cancer    Obesity    Psoriasis     SURGICAL HISTORY: Past Surgical History:  Procedure Laterality Date   CYSTOSCOPY/URETEROSCOPY/HOLMIUM LASER/STENT PLACEMENT Left 10/17/2022   Procedure: CYSTOSCOPY, LEFT RETROGRADE PYELOGRAM, LEFT URETEROSCOPY, HOLMIUM LASER LITHOTRIPSY, BASKET EXTRACTION, AND LEFT URETERAL STENT PLACEMENT;  Surgeon: Suzanne Lonni Righter, MD;  Location: [facility];  Service: Urology;  Laterality: Left;  45 MINUTES   ESOPHAGOGASTRODUODENOSCOPY N/A 12/30/2023   Procedure: EGD (ESOPHAGOGASTRODUODENOSCOPY);  Surgeon: Suzanne Fallow, MD;  Location: [facility];  Service: Gastroenterology;  Laterality: N/A;   ESOPHAGOGASTRODUODENOSCOPY (EGD) WITH PROPOFOL  N/A 05/27/2022   Procedure: ESOPHAGOGASTRODUODENOSCOPY (EGD) WITH PROPOFOL ;  Surgeon: Suzanne Fallow, MD;  Location: [facility];  Service: Gastroenterology;  Laterality: N/A;   EYE SURGERY     cataract   GLAUCOMA SURGERY  late 1990's   IR THORACENTESIS ASP PLEURAL SPACE W/IMG GUIDE  10/28/2018   IR THORACENTESIS ASP PLEURAL SPACE W/IMG GUIDE  10/29/2018   OPEN REDUCTION INTERNAL FIXATION (ORIF) DISTAL RADIAL FRACTURE Left 01/09/2019   Procedure: OPEN REDUCTION INTERNAL FIXATION (ORIF) HUMERAL FRACTURE;  Surgeon: Suzanne Kemps, MD;  Location: [facility];  Service: Orthopedics;  Laterality: Left;    SOCIAL  HISTORY: Social History   Socioeconomic History   Marital status: Widowed    Spouse name: Not on file   Number of children: 1   Years of education: Not on file   Highest education level: Not on file  Occupational History   Occupation: Visual merchandiser Programs    Comment: Self-employed  Tobacco Use   Smoking status: Never   Smokeless tobacco: Never   Tobacco comments:    second hand smoke exposure  Vaping Use   Vaping status: Never Used  Substance and Sexual Activity   Alcohol use: No   Drug use: No   Sexual activity: Not Currently  Other Topics Concern   Not on file  Social History Narrative   Has a daughter named [name]. Daughter has CP.   Social Drivers of Corporate investment banker Strain: Low Risk  (09/17/2022)   Overall Financial Resource Strain (CARDIA)    Difficulty of Paying Living Expenses: Not hard at all  Food Insecurity: No Food Insecurity (12/29/2023)   Hunger Vital Sign    Worried About Running Out of Food in the Last Year: Never true    Ran Out  of Food in the Last Year: Never true  Transportation Needs: No Transportation Needs (12/29/2023)   PRAPARE - Administrator, Civil Service (Medical): No    Lack of Transportation (Non-Medical): No  Physical Activity: Inactive (03/07/2023)   Exercise Vital Sign    Days of Exercise per Week: 0 days    Minutes of Exercise per Session: 0 min  Stress: Stress Concern Present (03/07/2023)   [employer] of Occupational Health - Occupational Stress Questionnaire    Feeling of Stress : To some extent  Social Connections: Socially Isolated (03/07/2023)   Social Connection and Isolation Panel    Frequency of Communication with Friends and Family: Once a week    Frequency of Social Gatherings with Friends and Family: Once a week    Attends Religious Services: Never    Database administrator or Organizations: Yes    Attends Banker Meetings: Never    Marital Status: Widowed  Intimate  Partner Violence: Not At Risk (12/29/2023)   Humiliation, Afraid, Rape, and Kick questionnaire    Fear of Current or Ex-Partner: No    Emotionally Abused: No    Physically Abused: No    Sexually Abused: No    FAMILY HISTORY: Family History  Problem Relation Age of Onset   Breast cancer Sister        in her 96's-70s   Heart disease Brother        CABG   Heart disease Brother        CABG   Skin cancer Brother        melanoma   Colon cancer Cousin    Heart attack Mother    Heart attack Father    Prostate cancer Brother        PHYSICAL EXAMINATION     Vitals:   02/27/24 1348  BP: (!) 141/61  Pulse: 88  Resp: 18  SpO2: 100%     Physical Exam Constitutional:      General: She is not in acute distress.    Appearance: Normal appearance. She is not toxic-appearing.     Comments: Examined in wheelchair  HENT:     Head: Normocephalic and atraumatic.  Eyes:     General: No scleral icterus. Cardiovascular:     Rate and Rhythm: Normal rate and regular rhythm.     Pulses: Normal pulses.     Heart sounds: Normal heart sounds.  Pulmonary:     Effort: Pulmonary effort is normal.     Breath sounds: Normal breath sounds.  Abdominal:     General: Abdomen is flat.     Palpations: Abdomen is soft.  Musculoskeletal:        General: No swelling.     Cervical back: Neck supple.  Lymphadenopathy:     Cervical: No cervical adenopathy.  Skin:    General: Skin is warm and dry.     Findings: Rash present.     Comments: Psoriatic rash diffusely  Neurological:     General: No focal deficit present.     Mental Status: She is alert.  Psychiatric:        Mood and Affect: Mood normal.        Behavior: Behavior normal.     LABORATORY DATA:  CBC    Component Value Date/Time   WBC 3.9 (L) 02/27/2024 1326   RBC 3.14 (L) 02/27/2024 1326   HGB 9.2 (L) 02/27/2024 1326   HGB 9.3 (L) 10/31/2023 1213   HCT 29.3 (  L) 02/27/2024 1326   PLT 135 (L) 02/27/2024 1326   PLT 218  10/31/2023 1213   MCV 93.3 02/27/2024 1326   MCH 29.3 02/27/2024 1326   MCHC 31.4 02/27/2024 1326   RDW 21.3 (H) 02/27/2024 1326   LYMPHSABS 0.5 (L) 02/27/2024 1326   MONOABS 0.4 02/27/2024 1326   EOSABS 0.2 02/27/2024 1326   BASOSABS 0.0 02/27/2024 1326    CMP     Component Value Date/Time   NA 141 02/27/2024 1326   K 3.3 (L) 02/27/2024 1326   CL 105 02/27/2024 1326   CO2 31 02/27/2024 1326   GLUCOSE 105 (H) 02/27/2024 1326   BUN 15 02/27/2024 1326   CREATININE 0.97 02/27/2024 1326   CALCIUM  9.2 02/27/2024 1326   PROT 6.3 (L) 02/27/2024 1326   ALBUMIN  3.4 (L) 02/27/2024 1326   AST 25 02/27/2024 1326   ALT 11 02/27/2024 1326   ALKPHOS 148 (H) 02/27/2024 1326   BILITOT 1.1 02/27/2024 1326   GFRNONAA >60 02/27/2024 1326   GFRAA >60 02/24/2020 1454   GFRAA >60 07/24/2019 1453     ASSESSMENT and THERAPY PLAN:   Assessment & Plan  Metastatic breast cancer with bone metastases Condition well-managed with Xeloda . Tolerating regimen with no new symptoms. Tumor markers decreased, indicating positive response. Bone pain and respiratory status stable. - Continue Xeloda  regimen as prescribed. - Order scan for late October to monitor disease progression. - Schedule follow-up appointment in one month.  Bone mets Managed with denosumab . Administration contingent on calcium  levels due to hypocalcemia risk. - Check calcium  levels before administering Xgeva . - Administer Xgeva  only if calcium  levels are adequate. - Schedule injection for next month, contingent on lab results.  Gastrointestinal bleeding Managed with Protonix . - Continue Protonix  once daily.  Psoriasis Worsening rash likely exacerbated by heat. - Renew Otezla  prescription with dermatologist. - Attend dermatology appointment next week.   All questions were answered. The patient knows to call the clinic with any problems, questions or concerns. We can certainly see the patient much sooner if necessary.  Total  encounter time:30 minutes*in face-to-face visit time, chart review, lab review, care coordination, order entry, and documentation of the encounter time.  *Total Encounter Time as defined by the Centers for Medicare and Medicaid Services includes, in addition to the face-to-face time of a patient visit (documented in the note above) non-face-to-face time: obtaining and reviewing outside history, ordering and reviewing medications, tests or procedures, care coordination (communications with other health care professionals or caregivers) and documentation in the medical record.

## 2024-02-28 LAB — CANCER ANTIGEN 27.29: CA 27.29: 337.2 U/mL — ABNORMAL HIGH (ref 0.0–38.6)

## 2024-03-02 ENCOUNTER — Other Ambulatory Visit (HOSPITAL_COMMUNITY): Payer: Self-pay

## 2024-03-02 ENCOUNTER — Other Ambulatory Visit: Payer: Self-pay

## 2024-03-04 ENCOUNTER — Other Ambulatory Visit: Payer: Self-pay

## 2024-03-04 ENCOUNTER — Other Ambulatory Visit: Payer: Self-pay | Admitting: Pharmacy Technician

## 2024-03-04 NOTE — Progress Notes (Signed)
 Specialty Pharmacy Refill Coordination Note  Suzanne [name] is a 63 y.o. female contacted today regarding refills of specialty medication(s) Capecitabine  (XELODA )   Patient requested Delivery   Delivery date: 03/06/24   Verified address: [address] [place]   Medication will be filled on 03/05/24.

## 2024-03-05 ENCOUNTER — Other Ambulatory Visit: Payer: Self-pay

## 2024-03-06 ENCOUNTER — Ambulatory Visit: Admitting: Nurse Practitioner

## 2024-03-06 ENCOUNTER — Encounter: Payer: Self-pay | Admitting: Hematology and Oncology

## 2024-03-09 ENCOUNTER — Ambulatory Visit: Attending: Nurse Practitioner | Admitting: Nurse Practitioner

## 2024-03-09 ENCOUNTER — Encounter: Payer: Self-pay | Admitting: Nurse Practitioner

## 2024-03-09 VITALS — BP 131/84 | HR 89 | Resp 18 | Ht 59.0 in | Wt 182.5 lb

## 2024-03-09 DIAGNOSIS — G8929 Other chronic pain: Secondary | ICD-10-CM | POA: Diagnosis not present

## 2024-03-09 DIAGNOSIS — I10 Essential (primary) hypertension: Secondary | ICD-10-CM | POA: Insufficient documentation

## 2024-03-09 DIAGNOSIS — C50919 Malignant neoplasm of unspecified site of unspecified female breast: Secondary | ICD-10-CM | POA: Diagnosis not present

## 2024-03-09 DIAGNOSIS — C7951 Secondary malignant neoplasm of bone: Secondary | ICD-10-CM | POA: Diagnosis not present

## 2024-03-09 DIAGNOSIS — E559 Vitamin D deficiency, unspecified: Secondary | ICD-10-CM | POA: Diagnosis not present

## 2024-03-09 DIAGNOSIS — Z760 Encounter for issue of repeat prescription: Secondary | ICD-10-CM | POA: Insufficient documentation

## 2024-03-09 DIAGNOSIS — Z170 Estrogen receptor positive status [ER+]: Secondary | ICD-10-CM | POA: Diagnosis not present

## 2024-03-09 DIAGNOSIS — E7800 Pure hypercholesterolemia, unspecified: Secondary | ICD-10-CM | POA: Diagnosis not present

## 2024-03-09 DIAGNOSIS — Z79621 Long term (current) use of calcineurin inhibitor: Secondary | ICD-10-CM | POA: Diagnosis not present

## 2024-03-09 DIAGNOSIS — Z23 Encounter for immunization: Secondary | ICD-10-CM

## 2024-03-09 DIAGNOSIS — Z79899 Other long term (current) drug therapy: Secondary | ICD-10-CM | POA: Diagnosis not present

## 2024-03-09 NOTE — Progress Notes (Addendum)
 Assessment & Plan:  Suzanne [name] was seen today for hypertension.  Diagnoses and all orders for this visit:  Primary hypertension Reminded to bring in blood pressure log for follow  up appointment.  RECOMMENDATIONS: DASH/Mediterranean Diets are healthier choices for HTN.    Need for influenza vaccination -     Flu vaccine HIGH DOSE PF(Fluzone Trivalent)  Vitamin D  deficiency disease -     VITAMIN D  25 Hydroxy (Vit-D Deficiency, Fractures)  Hypercholesterolemia -     Lipid panel INSTRUCTIONS: Work on a low fat, heart healthy diet and participate in regular aerobic exercise program by working out at least 150 minutes per week; 5 days a week-30 minutes per day. Avoid red meat/beef/steak,  fried foods. junk foods, sodas, sugary drinks, unhealthy snacking, alcohol and smoking.  Drink at least 80 oz of water per day and monitor your carbohydrate intake daily.      Patient has been counseled on age-appropriate routine health concerns for screening and prevention. These are reviewed and up-to-date. Referrals have been placed accordingly. Immunizations are up-to-date or declined.    Subjective:   Chief Complaint  Patient presents with   Hypertension    History of Present Illness Suzanne [name] is a 63 year old female with chronic pain who presents for follow-up on her pain management and medication refills.  She is accompanied by her daughter Suzanne [name] today.   She has a PMH of anemia, right breast CA, chronic pain, HTN, seasonal allergies    Followed by Oncology for Stage IV estrogen receptor positive breast cancer and bone mets  HTN Blood pressure is well-controlled today.  She is taking furosemide  20 mg daily. BP Readings from Last 3 Encounters:  03/09/24 131/84  02/27/24 (!) 141/61  01/16/24 (!) 141/73    She  is seeking updates on his vaccinations, specifically the flu shot and COVID-19 booster. Received a flu shot recently but has not had a COVID booster in a while. Oncology  team advised to get the COVID booster.  There was a previous issue with the dosage of one of the chemotherapy medications, which took about a month and a half to resolve.  Currently, there is an insurance issue with his oxygen  supply due to a change in her insurance carrier. The insurance company claims that three months of payments were missed, and has threatened to discontinue the oxygen  service. She has been on oxygen  for years, and this issue has been communicated to the cancer center, which originally prescribed the oxygen .   No upper respiratory symptoms such as runny nose, fever, or cough. Review of Systems  Constitutional:  Negative for fever, malaise/fatigue and weight loss.  HENT: Negative.  Negative for nosebleeds.   Eyes: Negative.  Negative for blurred vision, double vision and photophobia.  Respiratory: Negative.  Negative for cough and shortness of breath.   Cardiovascular: Negative.  Negative for chest pain, palpitations and leg swelling.  Gastrointestinal: Negative.  Negative for heartburn, nausea and vomiting.  Musculoskeletal: Negative.  Negative for myalgias.  Neurological:  Positive for weakness. Negative for dizziness, focal weakness, seizures and headaches.  Psychiatric/Behavioral: Negative.  Negative for suicidal ideas.     Past Medical History:  Diagnosis Date   Anemia    Cancer (HCC)    Family history of breast cancer    Family history of colon cancer    Family history of melanoma    Family history of prostate cancer    Gallstones    History of hiatal hernia  History of kidney stones    Mastitis    reports history of recurrent mastitis   Metastatic breast cancer    Obesity    Psoriasis     Past Surgical History:  Procedure Laterality Date   CYSTOSCOPY/URETEROSCOPY/HOLMIUM LASER/STENT PLACEMENT Left 10/17/2022   Procedure: CYSTOSCOPY, LEFT RETROGRADE PYELOGRAM, LEFT URETEROSCOPY, HOLMIUM LASER LITHOTRIPSY, BASKET EXTRACTION, AND LEFT URETERAL STENT  PLACEMENT;  Surgeon: Devere Lonni Righter, MD;  Location: [facility];  Service: Urology;  Laterality: Left;  45 MINUTES   ESOPHAGOGASTRODUODENOSCOPY N/A 12/30/2023   Procedure: EGD (ESOPHAGOGASTRODUODENOSCOPY);  Surgeon: Burnette Fallow, MD;  Location: [facility];  Service: Gastroenterology;  Laterality: N/A;   ESOPHAGOGASTRODUODENOSCOPY (EGD) WITH PROPOFOL  N/A 05/27/2022   Procedure: ESOPHAGOGASTRODUODENOSCOPY (EGD) WITH PROPOFOL ;  Surgeon: Burnette Fallow, MD;  Location[facility];  Service: Gastroenterology;  Laterality: N/A;   EYE SURGERY     cataract   GLAUCOMA SURGERY  late 1990's   IR THORACENTESIS ASP PLEURAL SPACE W/IMG GUIDE  10/28/2018   IR THORACENTESIS ASP PLEURAL SPACE W/IMG GUIDE  10/29/2018   OPEN REDUCTION INTERNAL FIXATION (ORIF) DISTAL RADIAL FRACTURE Left 01/09/2019   Procedure: OPEN REDUCTION INTERNAL FIXATION (ORIF) HUMERAL FRACTURE;  Surgeon: Kay Kemps, MD;  Location: [facility];  Service: Orthopedics;  Laterality: Left;    Family History  Problem Relation Age of Onset   Breast cancer Sister        in her 72's-70s   Heart disease Brother        CABG   Heart disease Brother        CABG   Skin cancer Brother        melanoma   Colon cancer Cousin    Heart attack Mother    Heart attack Father    Prostate cancer Brother     Social History Reviewed with no changes to be made today.   Outpatient Medications Prior to Visit  Medication Sig Dispense Refill   acetaminophen  (TYLENOL ) 500 MG tablet Take 1,000 mg by mouth every 6 (six) hours as needed for mild pain (pain score 1-3).     albuterol  (VENTOLIN  HFA) 108 (90 Base) MCG/ACT inhaler Inhale 2 puffs into the lungs every 6 (six) hours as needed for wheezing or shortness of breath. 34 g 2   Apremilast  (OTEZLA ) 30 MG TABS Take 1 tablet (30 mg total) by mouth 2 (two) times daily. 60 tablet 11   ciprofloxacin  (CIPRO ) 500 MG tablet Take 1 tablet (500 mg total) by mouth 2 (two) times daily. (Patient not taking:  Reported on 03/09/2024) 14 tablet 0   clobetasol  (TEMOVATE ) 0.05 % external solution Apply 1 Application topically 2 (two) times daily. (Patient not taking: Reported on 01/16/2024) 50 mL 2   Denosumab  (XGEVA  [place]) Inject 1 Dose into the skin every 30 (thirty) days.     furosemide  (LASIX ) 20 MG tablet Take 1 tablet (20 mg total) by mouth daily. 90 tablet 4   ipratropium-albuterol  (DUONEB) 0.5-2.5 (3) MG/3ML SOLN Take 3 mLs by nebulization every 6 (six) hours as needed. 360 mL 0   loperamide  (IMODIUM ) 2 MG capsule Take 2 mg by mouth daily as needed for diarrhea or loose stools.     ondansetron  (ZOFRAN -ODT) 8 MG disintegrating tablet Dissolve 1 tablet (8 mg total) by mouth every 8 (eight) hours as needed for nausea. 20 tablet 3   OYSTER SHELL CALCIUM  PLUS D 500-5 MG-MCG TABS TAKE ONE TABLET BY MOUTH EVERY MORNING and TAKE ONE TABLET BY MOUTH AT NOON and TAKE ONE TABLET BY  MOUTH EVERY EVENING (Patient not taking: Reported on 01/16/2024) 90 tablet 1   pantoprazole  (PROTONIX ) 40 MG tablet Take 1 tablet (40 mg total) by mouth daily. Take Protonix  40 mg twice a day for 6 weeks and then start taking once a day indefinitely 60 tablet 1   potassium chloride  (MICRO-K ) 10 MEQ CR capsule Take 1 capsule (10 mEq total) by mouth daily. 90 capsule 4   sertraline  (ZOLOFT ) 50 MG tablet Take 1.5 tablets (75 mg total) by mouth daily. Take 75 mg by mouth daily (Patient not taking: Reported on 03/09/2024) 45 tablet 3   tacrolimus  (PROTOPIC ) 0.1 % ointment Apply topically 2 (two) times daily. Apply to affected areas of psoriasis on the days not using triamcinolone . (Patient not taking: Reported on 03/09/2024) 100 g 0   traMADol  (ULTRAM ) 50 MG tablet Take 1 tablet (50 mg total) by mouth every 6 (six) hours as needed for moderate pain (pain score 4-6) or severe pain (pain score 7-10). 30 tablet 0   triamcinolone  ointment (KENALOG ) 0.1 % Apply 1 Application topically 2 (two) times daily to affected areas of psoriasis for 2 weeks, then  take 2 week break before restarting. 453 g 3   capecitabine  (XELODA ) 500 MG tablet Take 3 tabs (1500 mg) by mouth every AM and 2 tablets by mouth in PM Take for 7 days, followed by a 7 day rest period. Repeat every 14 days. Take within 30 minutes of a meal. 35 tablet 4   No facility-administered medications prior to visit.    Allergies  Allergen Reactions   Ferumoxytol  Other (See Comments)    Pt had hypersensitivity to Ferumoxytol . She had Facial Flushing. See note from 07/14/2021       Objective:    BP 131/84 (BP Location: Left Arm, Patient Position: Sitting, Cuff Size: Normal)   Pulse 89   Resp 18   Ht 4' 11 (1.499 m)   Wt 182 lb 8 oz (82.8 kg)   LMP 05/28/2013 (Within Months)   SpO2 99%   BMI 36.86 kg/m  Wt Readings from Last 3 Encounters:  03/09/24 182 lb 8 oz (82.8 kg)  02/27/24 186 lb (84.4 kg)  01/16/24 179 lb 3.2 oz (81.3 kg)    Physical Exam Vitals and nursing note reviewed.  Constitutional:      Appearance: She is well-developed.  HENT:     Head: Normocephalic and atraumatic.  Cardiovascular:     Rate and Rhythm: Normal rate and regular rhythm.     Heart sounds: Normal heart sounds. No murmur heard.    No friction rub. No gallop.  Pulmonary:     Effort: Pulmonary effort is normal. No tachypnea or respiratory distress.     Breath sounds: Normal breath sounds. No decreased breath sounds, wheezing, rhonchi or rales.  Chest:     Chest wall: No tenderness.  Musculoskeletal:        General: Normal range of motion.     Cervical back: Normal range of motion.  Skin:    General: Skin is warm and dry.  Neurological:     Mental Status: She is alert and oriented to person, place, and time.     Coordination: Coordination normal.  Psychiatric:        Behavior: Behavior normal. Behavior is cooperative.        Thought Content: Thought content normal.        Judgment: Judgment normal.          Patient has been counseled extensively about  nutrition and exercise  as well as the importance of adherence with medications and regular follow-up. The patient was given clear instructions to go to ER or return to medical center if symptoms don't improve, worsen or new problems develop. The patient verbalized understanding.   Follow-up: Return in about 6 months (around 09/07/2024).   Haze LELON Servant, FNP-BC [facility] [place], [place] [phone]   03/22/2024, 3:35 PM

## 2024-03-10 ENCOUNTER — Ambulatory Visit (INDEPENDENT_AMBULATORY_CARE_PROVIDER_SITE_OTHER): Admitting: Dermatology

## 2024-03-10 DIAGNOSIS — L299 Pruritus, unspecified: Secondary | ICD-10-CM

## 2024-03-10 DIAGNOSIS — L409 Psoriasis, unspecified: Secondary | ICD-10-CM | POA: Diagnosis not present

## 2024-03-10 DIAGNOSIS — Z79899 Other long term (current) drug therapy: Secondary | ICD-10-CM | POA: Diagnosis not present

## 2024-03-10 LAB — LIPID PANEL
Chol/HDL Ratio: 3.1 ratio (ref 0.0–4.4)
Cholesterol, Total: 198 mg/dL (ref 100–199)
HDL: 64 mg/dL (ref >39)
LDL Chol Calc (NIH): 119 mg/dL — ABNORMAL HIGH (ref 0–99)
Triglycerides: 85 mg/dL (ref 0–149)
VLDL Cholesterol Cal: 15 mg/dL (ref 5–40)

## 2024-03-10 LAB — VITAMIN D 25 HYDROXY (VIT D DEFICIENCY, FRACTURES): Vit D, 25-Hydroxy: 18 ng/mL — ABNORMAL LOW (ref 30.0–100.0)

## 2024-03-10 NOTE — Patient Instructions (Addendum)
 Date: March 10, 2024  Jobos,  Thank you for visiting us  today. We appreciate your commitment to improving your skin health. Here is a summary of the key instructions from today's consultation:  - Prescription Medications:   - Otezla : Start taking samples provided today   - Triamcinolone  cream: Begin using the cream you have at home  - Skincare Routine:   - Use CeraVe Advanced Repair lotion for dry skin   - Gently scrub your skin in the shower to help remove flaking  - Follow-Up: We will see you again in 6 months to assess your progress. Come back sooner if Otezla  does not help your skin.  We look forward to seeing improvement at your next visit. If you have any questions or concerns before then, please do not hesitate to contact our office through MyChart or by phone.  Warm regards,  Dr. [name] Lenis, Dermatology   Important Information  Due to recent changes in healthcare laws, you may see results of your pathology and/or laboratory studies on MyChart before the doctors have had a chance to review them. We understand that in some cases there may be results that are confusing or concerning to you. Please understand that not all results are received at the same time and often the doctors may need to interpret multiple results in order to provide you with the best plan of care or course of treatment. Therefore, we ask that you please give us  2 business days to thoroughly review all your results before contacting the office for clarification. Should we see a critical lab result, you will be contacted sooner.   If You Need Anything After Your Visit  If you have any questions or concerns for your doctor, please call our main line at [phone] If no one answers, please leave a voicemail as directed and we will return your call as soon as possible. Messages left after 4 pm will be answered the following business day.   You may also send us  a message via MyChart. We typically  respond to MyChart messages within 1-2 business days.  For prescription refills, please ask your pharmacy to contact our office. Our fax number is [phone].  If you have an urgent issue when the clinic is closed that cannot wait until the next business day, you can page your doctor at the number below.    Please note that while we do our best to be available for urgent issues outside of office hours, we are not available 24/7.   If you have an urgent issue and are unable to reach us , you may choose to seek medical care at your doctor's office, retail clinic, urgent care center, or emergency room.  If you have a medical emergency, please immediately call 911 or go to the emergency department. In the event of inclement weather, please call our main line at [phone] for an update on the status of any delays or closures.  Dermatology Medication Tips: Please keep the boxes that topical medications come in in order to help keep track of the instructions about where and how to use these. Pharmacies typically print the medication instructions only on the boxes and not directly on the medication tubes.   If your medication is too expensive, please contact our office at [phone] or send us  a message through MyChart.   We are unable to tell what your co-pay for medications will be in advance as this is different depending on your insurance coverage. However, we may  be able to find a substitute medication at lower cost or fill out paperwork to get insurance to cover a needed medication.   If a prior authorization is required to get your medication covered by your insurance company, please allow us  1-2 business days to complete this process.  Drug prices often vary depending on where the prescription is filled and some pharmacies may offer cheaper prices.  The website www.goodrx.com contains coupons for medications through different pharmacies. The prices here do not account for what the cost  may be with help from insurance (it may be cheaper with your insurance), but the website can give you the price if you did not use any insurance.  - You can print the associated coupon and take it with your prescription to the pharmacy.  - You may also stop by our office during regular business hours and pick up a GoodRx coupon card.  - If you need your prescription sent electronically to a different pharmacy, notify our office through [facility] or by phone at [phone]

## 2024-03-10 NOTE — Progress Notes (Unsigned)
   Follow-Up Visit   Subjective  Suzanne [name] is a 63 y.o. female who presents for the following: Psoriasis  Suzanne [name] is here to follow up on psoriasis. Her last appointment was 04/16/23. Triamcinolone , tacrolimus , and Otezla . She has not been on Otezla  for a few months now due to insurance. She would like to restart Otezla  as it did help when she was taking it. The creams work as well when she needs to use them.   Suzanne [name] also complains of dark spots on various places on her body. She is thinking this is coming from her cancer treatments. She has been on Xeloda    The following portions of the chart were reviewed this encounter and updated as appropriate: medications, allergies, medical history  Review of Systems:  No other skin or systemic complaints except as noted in HPI or Assessment and Plan.  Objective  Well appearing patient in no apparent distress; mood and affect are within normal limits.  Areas Examined: Arms and legs  Relevant exam findings are noted in the Assessment and Plan.      Assessment & Plan    1. Psoriasis  - Assessment: Patient presents with severe psoriasis flare following 63-month absence and discontinuation of Otezla  due to missed insurance-required follow-up appointment during breast cancer treatment. Clinical examination reveals erythroderma and scaly plaques on abdomen and breast, pink-brown papules and plaques on lower extremities, affecting approximately 80% body surface area with IgA score of 3. Previously responded well to Otezla  but had failed topical triamcinolone  and tacrolimus  before starting systemic therapy. Current presentation represents significant disease exacerbation following treatment interruption.  BSA 80%, IGA 3, Itch 6/10 Tried and Failed: triamcinolone , tacrolimus , hydrocortisone  Was on Otezla  a few months ago and did well, pt discontinued due to issues around access  - Plan:    Re-initiate systemic therapy:     - Re-initiate  Otezla  therapy with samples provided for 4-6 weeks     - Check insurance system for next steps regarding Otezla  coverage    Topical therapy:     - Start triamcinolone      - Use advanced repair moisturizer and mechanical exfoliation in shower for flaking    Patient education:     - Importance of treatment adherence discussed   Side effects of Otezla  (apremilast ) include diarrhea, nausea, headache, upper respiratory infection, depression, and weight decrease (5-10%). It should only be taken by pregnant women after a discussion regarding risks and benefits with their doctor. Goal is control of skin condition, not cure.  The use of Otezla  requires long term medication management, including periodic office visits.  Pre-visit planning reviewing the last office visit, labs, imaging and care everywhere when applicable was 8 minutes Intra-visit was 30 minutes and included updating the relevant history, performing a video or in-person physical exam as appropriate, creating a treatment plan and used shared decision making with the patient.  Post-visit was 10 minutes that encompassed note completion, placing of orders, updating patient instructions and coordination of care.   Follow-up in 6 months, sooner if Otezla  ineffective.       No follow-ups on file.  I, Gordan Beams, CMA, am acting as scribe for [employer], DO.   Documentation: I have reviewed the above documentation for accuracy and completeness, and I agree with the above.  [name] Lenis, DO

## 2024-03-11 ENCOUNTER — Encounter: Payer: Self-pay | Admitting: Dermatology

## 2024-03-11 ENCOUNTER — Ambulatory Visit: Payer: Self-pay | Admitting: Nurse Practitioner

## 2024-03-11 ENCOUNTER — Other Ambulatory Visit: Payer: Self-pay | Admitting: Hematology and Oncology

## 2024-03-11 ENCOUNTER — Other Ambulatory Visit: Payer: Self-pay

## 2024-03-11 DIAGNOSIS — E559 Vitamin D deficiency, unspecified: Secondary | ICD-10-CM

## 2024-03-11 MED ORDER — CAPECITABINE 500 MG PO TABS
ORAL_TABLET | ORAL | 4 refills | Status: DC
  Filled 2024-03-12: qty 35, fill #0
  Filled 2024-03-16: qty 35, 14d supply, fill #0
  Filled 2024-04-07: qty 35, 14d supply, fill #1
  Filled 2024-04-15 – 2024-04-17 (×3): qty 35, 14d supply, fill #2
  Filled 2024-04-27: qty 35, 14d supply, fill #3
  Filled 2024-05-13: qty 35, 14d supply, fill #4

## 2024-03-11 MED ORDER — VITAMIN D (ERGOCALCIFEROL) 1.25 MG (50000 UNIT) PO CAPS
50000.0000 [IU] | ORAL_CAPSULE | ORAL | 0 refills | Status: AC
  Filled 2024-03-11: qty 12, 84d supply, fill #0

## 2024-03-12 ENCOUNTER — Ambulatory Visit (HOSPITAL_COMMUNITY)
Admission: RE | Admit: 2024-03-12 | Discharge: 2024-03-12 | Disposition: A | Discharge status: Home / Self Care | Source: Ambulatory Visit | Attending: Hematology and Oncology | Admitting: Hematology and Oncology

## 2024-03-12 ENCOUNTER — Other Ambulatory Visit (HOSPITAL_COMMUNITY): Payer: Self-pay

## 2024-03-12 ENCOUNTER — Other Ambulatory Visit: Payer: Self-pay

## 2024-03-12 ENCOUNTER — Encounter: Payer: Self-pay | Admitting: Hematology and Oncology

## 2024-03-12 DIAGNOSIS — C7951 Secondary malignant neoplasm of bone: Secondary | ICD-10-CM | POA: Diagnosis present

## 2024-03-12 MED ORDER — IOHEXOL 300 MG/ML  SOLN
100.0000 mL | Freq: Once | INTRAMUSCULAR | Status: AC | PRN
  Administered 2024-03-12: 100 mL via INTRAVENOUS

## 2024-03-16 ENCOUNTER — Other Ambulatory Visit: Payer: Self-pay

## 2024-03-16 ENCOUNTER — Encounter: Payer: Self-pay | Admitting: Hematology and Oncology

## 2024-03-16 NOTE — Progress Notes (Signed)
 Specialty Pharmacy Refill Coordination Note  Suzanne [name] is a 63 y.o. female contacted today regarding refills of specialty medication(s) Capecitabine  (XELODA )   Patient requested Delivery   Delivery date: 03/19/24   Verified address: [address] [place]   Medication will be filled on 03/18/24.

## 2024-03-17 ENCOUNTER — Ambulatory Visit: Payer: Self-pay | Admitting: Hematology and Oncology

## 2024-03-17 ENCOUNTER — Other Ambulatory Visit: Payer: Self-pay

## 2024-03-18 ENCOUNTER — Other Ambulatory Visit: Payer: Self-pay

## 2024-03-22 ENCOUNTER — Encounter: Payer: Self-pay | Admitting: Nurse Practitioner

## 2024-03-23 ENCOUNTER — Other Ambulatory Visit: Payer: Self-pay

## 2024-03-23 NOTE — Progress Notes (Signed)
 Specialty Pharmacy Ongoing Clinical Assessment Note  Suzanne [name] is a 63 y.o. female who is being followed by the specialty pharmacy service for [facility] Oncology   Patient's specialty medication(s) reviewed today: Capecitabine  (XELODA )   Missed doses in the last 4 weeks: 0   Patient/Caregiver did not have any additional questions or concerns.   Therapeutic benefit summary: Patient is achieving benefit   Adverse events/side effects summary: No adverse events/side effects   Patient's therapy is appropriate to: Continue    Goals Addressed             This Visit's Progress    Slow Disease Progression   On track    Patient is unable to be assessed as therapy was recently initiated. Patient will maintain adherence.  Per Dr. Walter notes from 03/17/24, the recent CT showed stable disease.          Follow up: 3 months  Silvano [name] Specialty Pharmacist

## 2024-03-25 ENCOUNTER — Telehealth: Payer: Self-pay

## 2024-03-25 NOTE — Telephone Encounter (Signed)
 Spoke with patient and confirmed appointment on 10/30

## 2024-03-26 ENCOUNTER — Inpatient Hospital Stay

## 2024-03-26 ENCOUNTER — Inpatient Hospital Stay (HOSPITAL_BASED_OUTPATIENT_CLINIC_OR_DEPARTMENT_OTHER): Admitting: Hematology and Oncology

## 2024-03-26 VITALS — BP 140/66 | HR 106 | Temp 98.3°F | Resp 18 | Wt 179.2 lb

## 2024-03-26 DIAGNOSIS — C7951 Secondary malignant neoplasm of bone: Secondary | ICD-10-CM

## 2024-03-26 DIAGNOSIS — C50819 Malignant neoplasm of overlapping sites of unspecified female breast: Secondary | ICD-10-CM

## 2024-03-26 DIAGNOSIS — G893 Neoplasm related pain (acute) (chronic): Secondary | ICD-10-CM

## 2024-03-26 DIAGNOSIS — D500 Iron deficiency anemia secondary to blood loss (chronic): Secondary | ICD-10-CM

## 2024-03-26 LAB — CBC WITH DIFFERENTIAL/PLATELET
Abs Immature Granulocytes: 0.02 K/uL (ref 0.00–0.07)
Basophils Absolute: 0 K/uL (ref 0.0–0.1)
Basophils Relative: 1 %
Eosinophils Absolute: 0.1 K/uL (ref 0.0–0.5)
Eosinophils Relative: 3 %
HCT: 30.9 % — ABNORMAL LOW (ref 36.0–46.0)
Hemoglobin: 9.5 g/dL — ABNORMAL LOW (ref 12.0–15.0)
Immature Granulocytes: 1 %
Lymphocytes Relative: 15 %
Lymphs Abs: 0.5 K/uL — ABNORMAL LOW (ref 0.7–4.0)
MCH: 28.9 pg (ref 26.0–34.0)
MCHC: 30.7 g/dL (ref 30.0–36.0)
MCV: 93.9 fL (ref 80.0–100.0)
Monocytes Absolute: 0.3 K/uL (ref 0.1–1.0)
Monocytes Relative: 8 %
Neutro Abs: 2.5 K/uL (ref 1.7–7.7)
Neutrophils Relative %: 72 %
Platelets: 148 K/uL — ABNORMAL LOW (ref 150–400)
RBC: 3.29 MIL/uL — ABNORMAL LOW (ref 3.87–5.11)
RDW: 22.8 % — ABNORMAL HIGH (ref 11.5–15.5)
WBC: 3.5 K/uL — ABNORMAL LOW (ref 4.0–10.5)
nRBC: 0 % (ref 0.0–0.2)

## 2024-03-26 LAB — CMP (CANCER CENTER ONLY)
ALT: 12 U/L (ref 0–44)
AST: 27 U/L (ref 15–41)
Albumin: 3.5 g/dL (ref 3.5–5.0)
Alkaline Phosphatase: 160 U/L — ABNORMAL HIGH (ref 38–126)
Anion gap: 6 (ref 5–15)
BUN: 12 mg/dL (ref 8–23)
CO2: 30 mmol/L (ref 22–32)
Calcium: 8.6 mg/dL — ABNORMAL LOW (ref 8.9–10.3)
Chloride: 105 mmol/L (ref 98–111)
Creatinine: 0.8 mg/dL (ref 0.44–1.00)
GFR, Estimated: >60 mL/min (ref >60)
Glucose, Bld: 121 mg/dL — ABNORMAL HIGH (ref 70–99)
Potassium: 3.9 mmol/L (ref 3.5–5.1)
Sodium: 141 mmol/L (ref 135–145)
Total Bilirubin: 1.2 mg/dL (ref 0.0–1.2)
Total Protein: 6.6 g/dL (ref 6.5–8.1)

## 2024-03-26 MED ORDER — DENOSUMAB 120 MG/1.7ML ~~LOC~~ SOLN
120.0000 mg | Freq: Once | SUBCUTANEOUS | Status: AC
  Administered 2024-03-26: 120 mg via SUBCUTANEOUS
  Filled 2024-03-26: qty 1.7

## 2024-03-26 NOTE — Progress Notes (Unsigned)
 [facility] Cancer Follow up:    Suzanne [name] ORN, NP [address] [place] [address]   DIAGNOSIS:  Cancer Staging  Carcinoma of breast, estrogen receptor positive, stage 4, right (HCC) Staging form: Breast, AJCC 8th Edition - Clinical: Stage IV (pM1) - Signed by Crawford Morna Pickle, NP on 02/15/2023    SUMMARY OF ONCOLOGIC HISTORY: Oncology History  Malignant neoplasm metastatic to bone [facility])  Carcinoma of breast, estrogen receptor positive, stage 4, right (HCC)  11/16/2018 Initial Diagnosis   Carcinoma of breast, estrogen receptor positive, stage 4, right (HCC)   02/15/2023 Cancer Staging   Staging form: Breast, AJCC 8th Edition - Clinical: Stage IV (pM1) - Signed by Crawford Morna Pickle, NP on 02/15/2023     CURRENT THERAPY: Xeloda   INTERVAL HISTORY:  Discussed the use of AI scribe software for clinical note transcription with the patient, who gave verbal consent to proceed.  History of Present Illness  Suzanne [name] is a 63 year old female with metastatic breast cancer on xeloda  who is here for follow up..  She has been on Otezla  for about two weeks, which has significantly improved her condition. Initially, she experienced an upset stomach during the first week of treatment, but this has since subsided. She is currently using sample packs provided by her dermatologist while waiting for full prescription approval.  She tolerates Xeloda  well, taking three tablets in the morning and two in the evening, with no noticeable side effects such as nausea, vomiting, rash, or diarrhea. She has experienced weakness on two occasions in the past four weeks, each episode lasting about 24 hours.  She receives Xgeva  injections monthly, with no new dental problems reported. There was an issue with scheduling her injection.  She reports improvement in her rash, although she still experiences some skin darkening. The colder weather is helping with  her symptoms.  No nausea, vomiting, rash on hands or feet, or diarrhea. She is able to walk around at home without problems.   Patient Active Problem List   Diagnosis Date Noted  . Hypercalcemia of malignancy 11/26/2023  . Iron deficiency anemia 09/22/2023  . Upper GI bleed 05/27/2022  . Acute prerenal azotemia 04/18/2022  . Dehydration 04/18/2022  . SIRS (systemic inflammatory response syndrome) (HCC) 04/18/2022  . Allergic rhinitis 04/18/2022  . Depression 04/18/2022  . Anemia of chronic disease 04/18/2022  . Bronchitis 04/18/2022  . Acute bronchitis 04/17/2022  . Intertrigo 01/10/2022  . Bacterial vaginitis 01/10/2022  . Sepsis due to cellulitis (HCC) 12/06/2021  . B12 deficiency anemia 08/15/2020  . Sensorineural hearing loss (SNHL) of both ears 08/07/2019  . Intractable back pain 02/03/2019  . Thoracic compression fracture, closed, initial encounter (HCC) 02/03/2019  . Essential hypertension 02/03/2019  . Genetic testing 01/27/2019  . Acute pharyngitis   . Bacteremia   . FTT (failure to thrive) in adult   . Fracture closed, humerus, shaft 01/09/2019  . Pneumonia 12/25/2018  . Acute respiratory disorder in immunocompromised patient 12/25/2018  . Family history of breast cancer   . Family history of prostate cancer   . Family history of melanoma   . Family history of colon cancer   . Pressure injury of skin 11/24/2018  . Chronic respiratory failure with hypoxia and hypercapnia (HCC) 11/19/2018  . 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 Hypophosphatemia 11/19/2018  . Hypocalcemia 11/19/2018  . Aspiration pneumonia (HCC) 11/19/2018  . SOB (shortness of breath)   . Palliative care by specialist   .  Carcinoma of breast, estrogen receptor positive, stage 4, right (HCC) 11/16/2018  . Hypoalbuminemia 11/16/2018  . Pleural effusion 11/14/2018  . Goals of care, counseling/discussion 11/06/2018  . Malignant neoplasm metastatic to lung (HCC) 10/31/2018  . Pain from  bone metastases (HCC) 10/31/2018  . Class 2 obesity 10/31/2018  . Bone injury   . Pleural effusion, bilateral   . Shortness of breath   . Abnormal breast finding   . Hypokalemia   . Breast skin changes   . Macrocytic anemia   . Malignant neoplasm metastatic to bone (HCC)   . Anemia, B12 deficiency 10/26/2018    is allergic to ferumoxytol .  MEDICAL HISTORY: Past Medical History:  Diagnosis Date  . Anemia   . Cancer (HCC)   . Family history of breast cancer   . Family history of colon cancer   . Family history of melanoma   . Family history of prostate cancer   . Gallstones   . History of hiatal hernia   . History of kidney stones   . Mastitis    reports history of recurrent mastitis  . Metastatic breast cancer   . Obesity   . Psoriasis     SURGICAL HISTORY: Past Surgical History:  Procedure Laterality Date  . CYSTOSCOPY/URETEROSCOPY/HOLMIUM LASER/STENT PLACEMENT Left 10/17/2022   Procedure: CYSTOSCOPY, LEFT RETROGRADE PYELOGRAM, LEFT URETEROSCOPY, HOLMIUM LASER LITHOTRIPSY, BASKET EXTRACTION, AND LEFT URETERAL STENT PLACEMENT;  Surgeon: Devere Lonni Righter, MD;  Location: [facility];  Service: Urology;  Laterality: Left;  45 MINUTES  . ESOPHAGOGASTRODUODENOSCOPY N/A 12/30/2023   Procedure: EGD (ESOPHAGOGASTRODUODENOSCOPY);  Surgeon: Burnette Fallow, MD;  Location: [facility];  Service: Gastroenterology;  Laterality: N/A;  . ESOPHAGOGASTRODUODENOSCOPY (EGD) WITH PROPOFOL  N/A 05/27/2022   Procedure: ESOPHAGOGASTRODUODENOSCOPY (EGD) WITH PROPOFOL ;  Surgeon: Burnette Fallow, MD;  Location: [facility];  Service: Gastroenterology;  Laterality: N/A;  . EYE SURGERY     cataract  . GLAUCOMA SURGERY  late 1990's  . IR THORACENTESIS ASP PLEURAL SPACE W/IMG GUIDE  10/28/2018  . IR THORACENTESIS ASP PLEURAL SPACE W/IMG GUIDE  10/29/2018  . OPEN REDUCTION INTERNAL FIXATION (ORIF) DISTAL RADIAL FRACTURE Left 01/09/2019   Procedure: OPEN REDUCTION INTERNAL FIXATION (ORIF) HUMERAL  FRACTURE;  Surgeon: Kay Kemps, MD;  Location: [facility];  Service: Orthopedics;  Laterality: Left;    SOCIAL HISTORY: Social History   Socioeconomic History  . Marital status: Widowed    Spouse name: Not on file  . Number of children: 1  . Years of education: Not on file  . Highest education level: Not on file  Occupational History  . Occupation: Publishing Rights Manager    Comment: Self-employed  Tobacco Use  . Smoking status: Never  . Smokeless tobacco: Never  . Tobacco comments:    second hand smoke exposure  Vaping Use  . Vaping status: Never Used  Substance and Sexual Activity  . Alcohol use: No  . Drug use: No  . Sexual activity: Not Currently  Other Topics Concern  . Not on file  Social History Narrative   Has a daughter named [name]. Daughter has CP.   Social Drivers of Health   Financial Resource Strain: Low Risk  (09/17/2022)   Overall Financial Resource Strain (CARDIA)   . Difficulty of Paying Living Expenses: Not hard at all  Food Insecurity: No Food Insecurity (12/29/2023)   Hunger Vital Sign   . Worried About Programme Researcher, Broadcasting/film/video in the Last Year: Never true   . Ran Out of Food in the Last Year:  Never true  Transportation Needs: No Transportation Needs (12/29/2023)   PRAPARE - Transportation   . Lack of Transportation (Medical): No   . Lack of Transportation (Non-Medical): No  Physical Activity: Inactive (03/07/2023)   Exercise Vital Sign   . Days of Exercise per Week: 0 days   . Minutes of Exercise per Session: 0 min  Stress: Stress Concern Present (03/07/2023)   [employer] of Occupational Health - Occupational Stress Questionnaire   . Feeling of Stress : To some extent  Social Connections: Socially Isolated (03/07/2023)   Social Connection and Isolation Panel   . Frequency of Communication with Friends and Family: Once a week   . Frequency of Social Gatherings with Friends and Family: Once a week   . Attends Religious Services: Never   .  Active Member of Clubs or Organizations: Yes   . Attends Banker Meetings: Never   . Marital Status: Widowed  Intimate Partner Violence: Not At Risk (12/29/2023)   Humiliation, Afraid, Rape, and Kick questionnaire   . Fear of Current or Ex-Partner: No   . Emotionally Abused: No   . Physically Abused: No   . Sexually Abused: No    FAMILY HISTORY: Family History  Problem Relation Age of Onset  . Breast cancer Sister        in her 19's-70s  . Heart disease Brother        CABG  . Heart disease Brother        CABG  . Skin cancer Brother        melanoma  . Colon cancer Cousin   . Heart attack Mother   . Heart attack Father   . Prostate cancer Brother        PHYSICAL EXAMINATION     Vitals:   03/26/24 1305  BP: (!) 140/66  Pulse: (!) 106  Resp: 18  Temp: 98.3 F (36.8 C)  SpO2: 100%     Physical Exam Constitutional:      General: She is not in acute distress.    Appearance: Normal appearance. She is not toxic-appearing.     Comments: Examined in wheelchair  HENT:     Head: Normocephalic and atraumatic.  Eyes:     General: No scleral icterus. Cardiovascular:     Rate and Rhythm: Normal rate and regular rhythm.     Pulses: Normal pulses.     Heart sounds: Normal heart sounds.  Pulmonary:     Effort: Pulmonary effort is normal.     Breath sounds: Normal breath sounds.  Abdominal:     General: Abdomen is flat.     Palpations: Abdomen is soft.  Musculoskeletal:        General: No swelling.     Cervical back: Neck supple.  Lymphadenopathy:     Cervical: No cervical adenopathy.  Skin:    General: Skin is warm and dry.     Findings: Rash present.     Comments: Psoriatic rash diffusely  Neurological:     General: No focal deficit present.     Mental Status: She is alert.  Psychiatric:        Mood and Affect: Mood normal.        Behavior: Behavior normal.     LABORATORY DATA:  CBC    Component Value Date/Time   WBC 3.5 (L) 03/26/2024  1239   RBC 3.29 (L) 03/26/2024 1239   HGB 9.5 (L) 03/26/2024 1239   HGB 9.3 (L) 10/31/2023 1213  HCT 30.9 (L) 03/26/2024 1239   PLT 148 (L) 03/26/2024 1239   PLT 218 10/31/2023 1213   MCV 93.9 03/26/2024 1239   MCH 28.9 03/26/2024 1239   MCHC 30.7 03/26/2024 1239   RDW 22.8 (H) 03/26/2024 1239   LYMPHSABS 0.5 (L) 03/26/2024 1239   MONOABS 0.3 03/26/2024 1239   EOSABS 0.1 03/26/2024 1239   BASOSABS 0.0 03/26/2024 1239    CMP     Component Value Date/Time   NA 141 02/27/2024 1326   K 3.3 (L) 02/27/2024 1326   CL 105 02/27/2024 1326   CO2 31 02/27/2024 1326   GLUCOSE 105 (H) 02/27/2024 1326   BUN 15 02/27/2024 1326   CREATININE 0.97 02/27/2024 1326   CALCIUM  9.2 02/27/2024 1326   PROT 6.3 (L) 02/27/2024 1326   ALBUMIN  3.4 (L) 02/27/2024 1326   AST 25 02/27/2024 1326   ALT 11 02/27/2024 1326   ALKPHOS 148 (H) 02/27/2024 1326   BILITOT 1.1 02/27/2024 1326   GFRNONAA >60 02/27/2024 1326   GFRAA >60 02/24/2020 1454   GFRAA >60 07/24/2019 1453     ASSESSMENT and THERAPY PLAN:   Assessment & Plan  Metastatic breast cancer with bone metastases Condition well-managed with Xeloda . Tolerating regimen with no new symptoms. Tumor markers decreased, indicating positive response. Bone pain and respiratory status stable. - Continue Xeloda  regimen as prescribed. - Order scan for late October to monitor disease progression. - Schedule follow-up appointment in one month.  Bone mets Managed with denosumab . Administration contingent on calcium  levels due to hypocalcemia risk. - Check calcium  levels before administering Xgeva . - Administer Xgeva  only if calcium  levels are adequate. - Schedule injection for next month, contingent on lab results.  Gastrointestinal bleeding Managed with Protonix . - Continue Protonix  once daily.  Psoriasis Worsening rash likely exacerbated by heat. - Renew Otezla  prescription with dermatologist. - Attend dermatology appointment next  week.   Metastatic liver cancer Stable disease on CT. Tolerating Xeloda  well with minor transient weakness. - Continue Xeloda : 3 tablets AM, 2 tablets PM. - Administer Xgeva  today; schedule future injections.  Osteoporosis Managed with Xgeva . No new dental issues. - Administer Xgeva  today; schedule future injections.  Psoriasis Improved with Otezla . Initial upset stomach resolved. Skin darkening not concerning. - Continue Otezla  with samples until prescription approval.   All questions were answered. The patient knows to call the clinic with any problems, questions or concerns. We can certainly see the patient much sooner if necessary.  Total encounter time:30 minutes*in face-to-face visit time, chart review, lab review, care coordination, order entry, and documentation of the encounter time.  *Total Encounter Time as defined by the Centers for Medicare and Medicaid Services includes, in addition to the face-to-face time of a patient visit (documented in the note above) non-face-to-face time: obtaining and reviewing outside history, ordering and reviewing medications, tests or procedures, care coordination (communications with other health care professionals or caregivers) and documentation in the medical record.

## 2024-03-27 ENCOUNTER — Encounter: Payer: Self-pay | Admitting: Hematology and Oncology

## 2024-03-27 LAB — CANCER ANTIGEN 27.29: CA 27.29: 330.9 U/mL — ABNORMAL HIGH (ref 0.0–38.6)

## 2024-03-30 ENCOUNTER — Other Ambulatory Visit (HOSPITAL_COMMUNITY): Payer: Self-pay

## 2024-03-30 ENCOUNTER — Other Ambulatory Visit (HOSPITAL_BASED_OUTPATIENT_CLINIC_OR_DEPARTMENT_OTHER): Payer: Self-pay

## 2024-03-31 ENCOUNTER — Other Ambulatory Visit: Payer: Self-pay

## 2024-03-31 ENCOUNTER — Other Ambulatory Visit (HOSPITAL_COMMUNITY): Payer: Self-pay

## 2024-03-31 MED ORDER — PANTOPRAZOLE SODIUM 40 MG PO TBEC
40.0000 mg | DELAYED_RELEASE_TABLET | Freq: Every day | ORAL | 1 refills | Status: AC
  Filled 2024-03-31: qty 90, 90d supply, fill #0
  Filled 2024-06-30: qty 90, 90d supply, fill #1

## 2024-04-07 ENCOUNTER — Other Ambulatory Visit: Payer: Self-pay

## 2024-04-07 NOTE — Progress Notes (Signed)
 Specialty Pharmacy Refill Coordination Note  Suzanne [name] is a 63 y.o. female contacted today regarding refills of specialty medication(s) Capecitabine  (XELODA )   Patient requested Delivery   Delivery date: 04/09/24   Verified address: [address] [place]   Medication will be filled on: 04/08/24

## 2024-04-15 ENCOUNTER — Other Ambulatory Visit (HOSPITAL_COMMUNITY): Payer: Self-pay

## 2024-04-16 ENCOUNTER — Other Ambulatory Visit: Payer: Self-pay

## 2024-04-16 ENCOUNTER — Other Ambulatory Visit (HOSPITAL_COMMUNITY): Payer: Self-pay

## 2024-04-17 ENCOUNTER — Other Ambulatory Visit: Payer: Self-pay

## 2024-04-20 ENCOUNTER — Other Ambulatory Visit: Payer: Self-pay

## 2024-04-20 NOTE — Progress Notes (Signed)
 Specialty Pharmacy Refill Coordination Note  Suzanne [name] is a 63 y.o. female contacted today regarding refills of specialty medication(s) Capecitabine  (XELODA )   Patient requested Delivery   Delivery date: 04/21/24   Verified address: [address] [place]    Medication will be filled on: 04/20/24

## 2024-04-27 ENCOUNTER — Other Ambulatory Visit (HOSPITAL_COMMUNITY): Payer: Self-pay

## 2024-04-28 ENCOUNTER — Inpatient Hospital Stay: Admitting: Hematology and Oncology

## 2024-04-28 ENCOUNTER — Inpatient Hospital Stay: Attending: Adult Health

## 2024-04-28 ENCOUNTER — Inpatient Hospital Stay

## 2024-04-28 ENCOUNTER — Other Ambulatory Visit: Payer: Self-pay

## 2024-04-28 VITALS — BP 148/68 | HR 84 | Resp 18

## 2024-04-28 VITALS — BP 140/80 | HR 117 | Temp 97.9°F | Resp 19 | Wt 174.7 lb

## 2024-04-28 DIAGNOSIS — E860 Dehydration: Secondary | ICD-10-CM

## 2024-04-28 DIAGNOSIS — C50819 Malignant neoplasm of overlapping sites of unspecified female breast: Secondary | ICD-10-CM

## 2024-04-28 DIAGNOSIS — C7951 Secondary malignant neoplasm of bone: Secondary | ICD-10-CM

## 2024-04-28 DIAGNOSIS — C50811 Malignant neoplasm of overlapping sites of right female breast: Secondary | ICD-10-CM | POA: Diagnosis present

## 2024-04-28 DIAGNOSIS — R110 Nausea: Secondary | ICD-10-CM | POA: Insufficient documentation

## 2024-04-28 DIAGNOSIS — Z170 Estrogen receptor positive status [ER+]: Secondary | ICD-10-CM | POA: Insufficient documentation

## 2024-04-28 LAB — CBC WITH DIFFERENTIAL/PLATELET
Abs Immature Granulocytes: 0.01 K/uL (ref 0.00–0.07)
Basophils Absolute: 0 K/uL (ref 0.0–0.1)
Basophils Relative: 1 %
Eosinophils Absolute: 0.1 K/uL (ref 0.0–0.5)
Eosinophils Relative: 2 %
HCT: 30.1 % — ABNORMAL LOW (ref 36.0–46.0)
Hemoglobin: 9.3 g/dL — ABNORMAL LOW (ref 12.0–15.0)
Immature Granulocytes: 0 %
Lymphocytes Relative: 17 %
Lymphs Abs: 0.5 K/uL — ABNORMAL LOW (ref 0.7–4.0)
MCH: 28.5 pg (ref 26.0–34.0)
MCHC: 30.9 g/dL (ref 30.0–36.0)
MCV: 92.3 fL (ref 80.0–100.0)
Monocytes Absolute: 0.3 K/uL (ref 0.1–1.0)
Monocytes Relative: 9 %
Neutro Abs: 2.1 K/uL (ref 1.7–7.7)
Neutrophils Relative %: 71 %
Platelets: 120 K/uL — ABNORMAL LOW (ref 150–400)
RBC: 3.26 MIL/uL — ABNORMAL LOW (ref 3.87–5.11)
RDW: 23.1 % — ABNORMAL HIGH (ref 11.5–15.5)
WBC: 2.9 K/uL — ABNORMAL LOW (ref 4.0–10.5)
nRBC: 0 % (ref 0.0–0.2)

## 2024-04-28 LAB — CMP (CANCER CENTER ONLY)
ALT: 15 U/L (ref 0–44)
AST: 49 U/L — ABNORMAL HIGH (ref 15–41)
Albumin: 3.7 g/dL (ref 3.5–5.0)
Alkaline Phosphatase: 139 U/L — ABNORMAL HIGH (ref 38–126)
Anion gap: 10 (ref 5–15)
BUN: 11 mg/dL (ref 8–23)
CO2: 28 mmol/L (ref 22–32)
Calcium: 8.7 mg/dL — ABNORMAL LOW (ref 8.9–10.3)
Chloride: 103 mmol/L (ref 98–111)
Creatinine: 0.76 mg/dL (ref 0.44–1.00)
GFR, Estimated: >60 mL/min (ref >60)
Glucose, Bld: 124 mg/dL — ABNORMAL HIGH (ref 70–99)
Potassium: 3.3 mmol/L — ABNORMAL LOW (ref 3.5–5.1)
Sodium: 141 mmol/L (ref 135–145)
Total Bilirubin: 1.3 mg/dL — ABNORMAL HIGH (ref 0.0–1.2)
Total Protein: 6.5 g/dL (ref 6.5–8.1)

## 2024-04-28 MED ORDER — DENOSUMAB 120 MG/1.7ML ~~LOC~~ SOLN: 120.0000 mg | Freq: Once | SUBCUTANEOUS | Status: DC

## 2024-04-28 MED ORDER — SODIUM CHLORIDE 0.9 % IV SOLN: Freq: Once | INTRAVENOUS | Status: AC

## 2024-04-28 NOTE — Progress Notes (Signed)
 [facility] Cancer Follow up:    [name], NP [address] [place] [address] [place] [address]   DIAGNOSIS:  Cancer Staging  Carcinoma of breast, estrogen receptor positive, stage 4, right (HCC) Staging form: Breast, AJCC 8th Edition - Clinical: Stage IV (pM1) - Signed by Crawford Morna Pickle, NP on 02/15/2023    SUMMARY OF ONCOLOGIC HISTORY: Oncology History  Malignant neoplasm metastatic to bone [facility])  Carcinoma of breast, estrogen receptor positive, stage 4, right (HCC)  11/16/2018 Initial Diagnosis   Carcinoma of breast, estrogen receptor positive, stage 4, right (HCC)   02/15/2023 Cancer Staging   Staging form: Breast, AJCC 8th Edition - Clinical: Stage IV (pM1) - Signed by Crawford Morna Pickle, NP on 02/15/2023     CURRENT THERAPY: Xeloda   INTERVAL HISTORY:  Discussed the use of AI scribe software for clinical note transcription with the patient, who gave verbal consent to proceed.  History of Present Illness  Suzanne [name] is a 63 year old female with metastatic breast cancer on xeloda  who is here for follow up..  She has been experiencing significant fatigue and excessive sleepiness over the past few days, sleeping more than usual and feeling very tired. Her food intake has decreased slightly, though not significantly. She recalls a day when she felt she might have had a bug, as she slept most of the day, but the sleepiness has persisted since then.  She is currently on Xeloda , taking three tablets in the morning and two in the evening, and today is the last day of her week on the medication before her week off. She also takes Otezla  for psoriasis.   In addition to fatigue, she has been experiencing increased nausea over the past week, requiring Zofran  every other day, which is more frequent than usual. She also reports occasional weakness, feeling as though she might collapse, although she has never fainted. Her caregiver assists her  with walking due to this weakness.  She mentions a very bad cracked tooth causing pain, which seems to worsen with changes in air pressure.  She experiences coughing and a sensation of wanting to vomit after walking to the bathroom without her oxygen  in the morning. This is accompanied by coughing up mucus. She feels better once she starts eating. Rest of the pertinent 10 point ROS reviewed and neg.   Patient Active Problem List   Diagnosis Date Noted   Hypercalcemia of malignancy 11/26/2023   Iron deficiency anemia 09/22/2023   Upper GI bleed 05/27/2022   Acute prerenal azotemia 04/18/2022   Dehydration 04/18/2022   SIRS (systemic inflammatory response syndrome) (HCC) 04/18/2022   Allergic rhinitis 04/18/2022   Depression 04/18/2022   Anemia of chronic disease 04/18/2022   Bronchitis 04/18/2022   Acute bronchitis 04/17/2022   Intertrigo 01/10/2022   Bacterial vaginitis 01/10/2022   Sepsis due to cellulitis (HCC) 12/06/2021   B12 deficiency anemia 08/15/2020   Sensorineural hearing loss (SNHL) of both ears 08/07/2019   Intractable back pain 02/03/2019   Thoracic compression fracture, closed, initial encounter (HCC) 02/03/2019   Essential hypertension 02/03/2019   Genetic testing 01/27/2019   Acute pharyngitis    Bacteremia    FTT (failure to thrive) in adult    Fracture closed, humerus, shaft 01/09/2019   Pneumonia 12/25/2018   Acute respiratory disorder in immunocompromised patient 12/25/2018   Family history of breast cancer    Family history of prostate cancer    Family history of melanoma  Family history of colon cancer    Pressure injury of skin 11/24/2018   Chronic respiratory failure with hypoxia and hypercapnia (HCC) 11/19/2018   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   Hypophosphatemia 11/19/2018   Hypocalcemia 11/19/2018   Aspiration pneumonia (HCC) 11/19/2018   SOB (shortness of breath)    Palliative care by specialist    Carcinoma of breast, estrogen  receptor positive, stage 4, right (HCC) 11/16/2018   Hypoalbuminemia 11/16/2018   Pleural effusion 11/14/2018   Goals of care, counseling/discussion 11/06/2018   Malignant 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  MEDICAL HISTORY: Past Medical History:  Diagnosis Date   Anemia    Cancer (HCC)    Family history of breast cancer    Family history of colon cancer    Family history of melanoma    Family history of prostate cancer    Gallstones    History of hiatal hernia    History of kidney stones    Mastitis    reports history of recurrent mastitis   Metastatic breast cancer    Obesity    Psoriasis     SURGICAL HISTORY: Past Surgical History:  Procedure Laterality Date   CYSTOSCOPY/URETEROSCOPY/HOLMIUM LASER/STENT PLACEMENT Left 10/17/2022   Procedure: CYSTOSCOPY, LEFT RETROGRADE PYELOGRAM, LEFT URETEROSCOPY, HOLMIUM LASER LITHOTRIPSY, BASKET EXTRACTION, AND LEFT URETERAL STENT PLACEMENT;  Surgeon: Devere Lonni Righter, MD;  Location: [facility];  Service: Urology;  Laterality: Left;  45 MINUTES   ESOPHAGOGASTRODUODENOSCOPY N/A 12/30/2023   Procedure: EGD (ESOPHAGOGASTRODUODENOSCOPY);  Surgeon: Burnette Fallow, MD;  Location: [facility];  Service: Gastroenterology;  Laterality: N/A;   ESOPHAGOGASTRODUODENOSCOPY (EGD) WITH PROPOFOL  N/A 05/27/2022   Procedure: ESOPHAGOGASTRODUODENOSCOPY (EGD) WITH PROPOFOL ;  Surgeon: Burnette Fallow, MD;  Location: [facility];  Service: Gastroenterology;  Laterality: N/A;   EYE SURGERY     cataract   GLAUCOMA SURGERY  late 1990's   IR THORACENTESIS ASP PLEURAL SPACE W/IMG GUIDE  10/28/2018    IR THORACENTESIS ASP PLEURAL SPACE W/IMG GUIDE  10/29/2018   OPEN REDUCTION INTERNAL FIXATION (ORIF) DISTAL RADIAL FRACTURE Left 01/09/2019   Procedure: OPEN REDUCTION INTERNAL FIXATION (ORIF) HUMERAL FRACTURE;  Surgeon: Kay Kemps, MD;  Location: [facility];  Service: Orthopedics;  Laterality: Left;    SOCIAL HISTORY: Social History   Socioeconomic History   Marital status: Widowed    Spouse name: Not on file   Number of children: 1   Years of education: Not on file   Highest education level: Not on file  Occupational History   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 Daughter has CP.   Social Drivers of Corporate Investment Banker Strain:  Low Risk  (09/17/2022)   Overall Financial Resource Strain (CARDIA)    Difficulty of Paying Living Expenses: Not hard at all  Food Insecurity: No Food Insecurity (12/29/2023)   Hunger Vital Sign    Worried About Running Out of Food in the Last Year: Never true    Ran Out of Food in the Last Year: Never true  Transportation Needs: No Transportation Needs (12/29/2023)   PRAPARE - Administrator, Civil Service (Medical): No    Lack of Transportation (Non-Medical): No  Physical Activity: Inactive (03/07/2023)   Exercise Vital Sign    Days of Exercise per Week: 0 days    Minutes of Exercise per Session: 0 min  Stress: Stress Concern Present (03/07/2023)   [employer] of Occupational Health - Occupational Stress Questionnaire    Feeling of Stress : To some extent  Social Connections: Socially Isolated (03/07/2023)   Social Connection and Isolation Panel     Frequency of Communication with Friends and Family: Once a week    Frequency of Social Gatherings with Friends and Family: Once a week    Attends Religious Services: Never    Database Administrator or Organizations: Yes    Attends Banker Meetings: Never    Marital Status: Widowed  Intimate Partner Violence: Not At Risk (12/29/2023)   Humiliation, Afraid, Rape, and Kick questionnaire    Fear of Current or Ex-Partner: No    Emotionally Abused: No    Physically Abused: No    Sexually Abused: No    FAMILY HISTORY: Family History  Problem Relation Age of Onset   Breast cancer Sister        in her 44's-70s   Heart disease Brother        CABG   Heart disease Brother        CABG   Skin cancer Brother        melanoma   Colon cancer Cousin    Heart attack Mother    Heart attack Father    Prostate cancer Brother        PHYSICAL EXAMINATION     Vitals:   04/28/24 1304  BP: (!) 140/80  Pulse: (!) 117  Resp: 19  Temp: 97.9 F (36.6 C)  SpO2: 100%     Physical Exam Constitutional:      General: She is not in acute distress.    Appearance: Normal appearance. She is not toxic-appearing.     Comments: Examined in wheelchair  HENT:     Head: Normocephalic and atraumatic.  Eyes:     General: No scleral icterus. Cardiovascular:     Rate and Rhythm: Regular rhythm. Tachycardia present.     Pulses: Normal pulses.     Heart sounds: Normal heart sounds.  Pulmonary:     Effort: Pulmonary effort is normal.     Breath sounds: Normal breath sounds.  Abdominal:     General: Abdomen is flat.     Palpations: Abdomen is soft.  Musculoskeletal:        General: No swelling.     Cervical back: Neck supple.  Lymphadenopathy:     Cervical: No cervical adenopathy.  Skin:    General: Skin is warm and dry.     Findings: Rash present.     Comments: Psoriatic rash improving  Neurological:     General: No focal deficit present.     Mental Status: She is alert.   Psychiatric:  Mood and Affect: Mood normal.        Behavior: Behavior normal.     LABORATORY DATA:  CBC    Component Value Date/Time   WBC 2.9 (L) 04/28/2024 1242   RBC 3.26 (L) 04/28/2024 1242   HGB 9.3 (L) 04/28/2024 1242   HGB 9.3 (L) 10/31/2023 1213   HCT 30.1 (L) 04/28/2024 1242   PLT 120 (L) 04/28/2024 1242   PLT 218 10/31/2023 1213   MCV 92.3 04/28/2024 1242   MCH 28.5 04/28/2024 1242   MCHC 30.9 04/28/2024 1242   RDW 23.1 (H) 04/28/2024 1242   LYMPHSABS 0.5 (L) 04/28/2024 1242   MONOABS 0.3 04/28/2024 1242   EOSABS 0.1 04/28/2024 1242   BASOSABS 0.0 04/28/2024 1242    CMP     Component Value Date/Time   NA 141 04/28/2024 1242   K 3.3 (L) 04/28/2024 1242   CL 103 04/28/2024 1242   CO2 28 04/28/2024 1242   GLUCOSE 124 (H) 04/28/2024 1242   BUN 11 04/28/2024 1242   CREATININE 0.76 04/28/2024 1242   CALCIUM  8.7 (L) 04/28/2024 1242   PROT 6.5 04/28/2024 1242   ALBUMIN  3.7 04/28/2024 1242   AST 49 (H) 04/28/2024 1242   ALT 15 04/28/2024 1242   ALKPHOS 139 (H) 04/28/2024 1242   BILITOT 1.3 (H) 04/28/2024 1242   GFRNONAA >60 04/28/2024 1242   GFRAA >60 02/24/2020 1454   GFRAA >60 07/24/2019 1453     ASSESSMENT and THERAPY PLAN:   Assessment & Plan  Metastatic breast cancer with bone metastases prior treatments including Ibrance , Orserdu , Truqap , and Faslodex .  Stable disease on most recent imaging 03/12/2024 She is now on xeloda .  Fatigue and excessive sleepiness Recent onset possibly due to chemotherapy or medications or a recent illness. Mild hypokalemia noted but unlikely the cause. We will continue to monitor.  Morning sickness  Increased nausea, possibly related to Xeloda . Symptoms improve after eating. - Continue Zofran  as needed.  Cracked tooth with pain requiring dental extraction Severe pain requires dental extraction. Xgeva  therapy held for three months prior. - Held Xgeva  therapy for three months. - Consulted with dentist  regarding timing and management.  Chemotherapy for malignancy (capecitabine /Xeloda ) Symptoms of fatigue, nausea, and weakness may be related to chemotherapy. - Continue current chemotherapy schedule   Low potassium (mild hypokalemia) Mild hypokalemia noted but not the cause of symptoms. Ok to monitor.  Anemia (hemoglobin 9.3) Mild anemia with hemoglobin at 9.3.  Overall stable,   Xgeva  (denosumab ) therapy on hold for dental extraction Xgeva  therapy on hold due to dental extraction. - Continue to hold Xgeva  therapy for three months prior to dental extraction.  Sinus Tachycardia and feeling unwell Will arrange for IVF today for supportive care. No other concern for infection, intractable pain at this time.   All questions were answered. The patient knows to call the clinic with any problems, questions or concerns. We can certainly see the patient much sooner if necessary.  Total encounter time:30 minutes*in face-to-face visit time, chart review, lab review, care coordination, order entry, and documentation of the encounter time.  *Total Encounter Time as defined by the Centers for Medicare and Medicaid Services includes, in addition to the face-to-face time of a patient visit (documented in the note above) non-face-to-face time: obtaining and reviewing outside history, ordering and reviewing medications, tests or procedures, care coordination (communications with other health care professionals or caregivers) and documentation in the medical record.

## 2024-04-28 NOTE — Patient Instructions (Signed)

## 2024-04-29 ENCOUNTER — Other Ambulatory Visit: Payer: Self-pay

## 2024-04-29 ENCOUNTER — Other Ambulatory Visit: Payer: Self-pay | Admitting: Adult Health

## 2024-04-29 DIAGNOSIS — Z170 Estrogen receptor positive status [ER+]: Secondary | ICD-10-CM

## 2024-04-29 LAB — CANCER ANTIGEN 27.29: CA 27.29: 489.3 U/mL — ABNORMAL HIGH (ref 0.0–38.6)

## 2024-04-29 MED ORDER — ONDANSETRON 8 MG PO TBDP
8.0000 mg | ORAL_TABLET | Freq: Three times a day (TID) | ORAL | 3 refills | Status: AC | PRN
  Filled 2024-04-29: qty 20, 7d supply, fill #0

## 2024-04-29 NOTE — Progress Notes (Signed)
 Specialty Pharmacy Refill Coordination Note  Suzanne [name] is a 63 y.o. female contacted today regarding refills of specialty medication(s) Capecitabine  (XELODA )   Patient requested Delivery   Delivery date: 05/01/24   Verified address: [address] [place]   Medication will be filled on: 04/30/24

## 2024-04-30 ENCOUNTER — Other Ambulatory Visit: Payer: Self-pay

## 2024-05-01 ENCOUNTER — Other Ambulatory Visit: Payer: Self-pay

## 2024-05-10 ENCOUNTER — Other Ambulatory Visit (HOSPITAL_COMMUNITY): Payer: Self-pay

## 2024-05-13 ENCOUNTER — Other Ambulatory Visit: Payer: Self-pay

## 2024-05-15 ENCOUNTER — Other Ambulatory Visit: Payer: Self-pay

## 2024-05-15 NOTE — Progress Notes (Signed)
 Specialty Pharmacy Refill Coordination Note  Suzanne [name] is a 63 y.o. female contacted today regarding refills of specialty medication(s) Capecitabine  (XELODA )   Patient requested Delivery   Delivery date: 05/18/24   Verified address: [address] [place]   Medication will be filled on: 05/15/24

## 2024-05-20 ENCOUNTER — Other Ambulatory Visit: Payer: Self-pay | Admitting: Hematology and Oncology

## 2024-05-20 ENCOUNTER — Telehealth: Payer: Self-pay

## 2024-05-20 ENCOUNTER — Other Ambulatory Visit: Payer: Self-pay

## 2024-05-20 NOTE — Telephone Encounter (Signed)
 Renewal of oxygen  orders faxed to [facility] [phone]. Fax confirmation received and pt aware. No concerns at this time.

## 2024-05-25 ENCOUNTER — Other Ambulatory Visit: Payer: Self-pay

## 2024-05-25 MED ORDER — CAPECITABINE 500 MG PO TABS
ORAL_TABLET | ORAL | 4 refills | Status: AC
  Filled 2024-05-25: qty 35, fill #0
  Filled 2024-05-27 – 2024-06-02 (×3): qty 35, 14d supply, fill #0
  Filled 2024-06-09 – 2024-06-11 (×2): qty 35, 14d supply, fill #1
  Filled 2024-06-23: qty 35, 14d supply, fill #2
  Filled 2024-07-02: qty 35, 14d supply, fill #3

## 2024-05-26 ENCOUNTER — Telehealth: Payer: Self-pay | Admitting: Hematology and Oncology

## 2024-05-26 ENCOUNTER — Inpatient Hospital Stay: Admitting: Hematology and Oncology

## 2024-05-26 ENCOUNTER — Inpatient Hospital Stay

## 2024-05-26 NOTE — Telephone Encounter (Signed)
 I spoke with patient to return her daughter's voicemail stating patient needed to reschedule due to having a family emergency. Patient rescheduled lab and MD from 05/26/2024 to 05/27/2024.

## 2024-05-27 ENCOUNTER — Inpatient Hospital Stay: Admitting: Hematology and Oncology

## 2024-05-27 ENCOUNTER — Inpatient Hospital Stay

## 2024-05-27 ENCOUNTER — Other Ambulatory Visit: Payer: Self-pay

## 2024-05-29 ENCOUNTER — Telehealth: Payer: Self-pay | Admitting: Hematology and Oncology

## 2024-05-29 NOTE — Telephone Encounter (Signed)
 I spoke with patient and she is aware of lab and MD appointments on 06/11/2024.

## 2024-06-01 ENCOUNTER — Other Ambulatory Visit: Payer: Self-pay

## 2024-06-01 ENCOUNTER — Encounter: Payer: Self-pay | Admitting: Hematology and Oncology

## 2024-06-01 NOTE — Progress Notes (Signed)
 Specialty Pharmacy Refill Coordination Note  Suzanne [name] is a 64 y.o. female contacted today regarding refills of specialty medication(s) Capecitabine  (XELODA )   Patient requested Delivery   Delivery date: 06/03/24   Verified address: [address] [place]   Medication will be filled on: 06/02/24

## 2024-06-02 ENCOUNTER — Other Ambulatory Visit (HOSPITAL_COMMUNITY): Payer: Self-pay

## 2024-06-02 ENCOUNTER — Encounter: Payer: Self-pay | Admitting: Hematology and Oncology

## 2024-06-02 ENCOUNTER — Other Ambulatory Visit: Payer: Self-pay

## 2024-06-05 ENCOUNTER — Other Ambulatory Visit: Payer: Self-pay

## 2024-06-05 NOTE — Progress Notes (Signed)
 Specialty Pharmacy Ongoing Clinical Assessment Note  Suzanne [name] is a 64 y.o. female who is being followed by the specialty pharmacy service for RxSp Oncology   Patient's specialty medication(s) reviewed today: Capecitabine  (XELODA )   Missed doses in the last 4 weeks: 0   Patient/Caregiver did not have any additional questions or concerns.   Therapeutic benefit summary: Patient is achieving benefit   Adverse events/side effects summary: No adverse events/side effects   Patient's therapy is appropriate to: Continue    Goals Addressed             This Visit's Progress    Maintain optimal adherence to therapy   On track    Patient is on track. Patient will maintain adherence      Slow Disease Progression   On track    Patient is unable to be assessed as therapy was recently initiated. Patient will maintain adherence.  Per Dr. Walter notes from 03/17/24, the recent CT showed stable disease.          Follow up: 3 months  [facility]

## 2024-06-09 ENCOUNTER — Other Ambulatory Visit: Payer: Self-pay

## 2024-06-11 ENCOUNTER — Inpatient Hospital Stay: Attending: Adult Health

## 2024-06-11 ENCOUNTER — Other Ambulatory Visit (HOSPITAL_COMMUNITY): Payer: Self-pay

## 2024-06-11 ENCOUNTER — Inpatient Hospital Stay: Admitting: Hematology and Oncology

## 2024-06-11 VITALS — BP 152/75 | HR 111 | Temp 97.6°F | Resp 18 | Wt 182.0 lb

## 2024-06-11 DIAGNOSIS — C50811 Malignant neoplasm of overlapping sites of right female breast: Secondary | ICD-10-CM

## 2024-06-11 DIAGNOSIS — Z803 Family history of malignant neoplasm of breast: Secondary | ICD-10-CM | POA: Diagnosis not present

## 2024-06-11 DIAGNOSIS — Z800 Family history of malignant neoplasm of digestive organs: Secondary | ICD-10-CM | POA: Insufficient documentation

## 2024-06-11 DIAGNOSIS — Z170 Estrogen receptor positive status [ER+]: Secondary | ICD-10-CM

## 2024-06-11 DIAGNOSIS — Z79899 Other long term (current) drug therapy: Secondary | ICD-10-CM | POA: Insufficient documentation

## 2024-06-11 DIAGNOSIS — C7951 Secondary malignant neoplasm of bone: Secondary | ICD-10-CM | POA: Diagnosis present

## 2024-06-11 DIAGNOSIS — Z808 Family history of malignant neoplasm of other organs or systems: Secondary | ICD-10-CM | POA: Insufficient documentation

## 2024-06-11 DIAGNOSIS — C50819 Malignant neoplasm of overlapping sites of unspecified female breast: Secondary | ICD-10-CM

## 2024-06-11 LAB — CMP (CANCER CENTER ONLY)
ALT: 24 U/L (ref 0–44)
AST: 79 U/L — ABNORMAL HIGH (ref 15–41)
Albumin: 3.6 g/dL (ref 3.5–5.0)
Alkaline Phosphatase: 199 U/L — ABNORMAL HIGH (ref 38–126)
Anion gap: 11 (ref 5–15)
BUN: 15 mg/dL (ref 8–23)
CO2: 28 mmol/L (ref 22–32)
Calcium: 9.7 mg/dL (ref 8.9–10.3)
Chloride: 101 mmol/L (ref 98–111)
Creatinine: 1 mg/dL (ref 0.44–1.00)
GFR, Estimated: >60 mL/min
Glucose, Bld: 152 mg/dL — ABNORMAL HIGH (ref 70–99)
Potassium: 3.3 mmol/L — ABNORMAL LOW (ref 3.5–5.1)
Sodium: 140 mmol/L (ref 135–145)
Total Bilirubin: 1.2 mg/dL (ref 0.0–1.2)
Total Protein: 6.7 g/dL (ref 6.5–8.1)

## 2024-06-11 LAB — CBC WITH DIFFERENTIAL/PLATELET
Abs Immature Granulocytes: 0.02 K/uL (ref 0.00–0.07)
Basophils Absolute: 0 K/uL (ref 0.0–0.1)
Basophils Relative: 1 %
Eosinophils Absolute: 0.1 K/uL (ref 0.0–0.5)
Eosinophils Relative: 2 %
HCT: 29.9 % — ABNORMAL LOW (ref 36.0–46.0)
Hemoglobin: 9.4 g/dL — ABNORMAL LOW (ref 12.0–15.0)
Immature Granulocytes: 1 %
Lymphocytes Relative: 16 %
Lymphs Abs: 0.7 K/uL (ref 0.7–4.0)
MCH: 29.6 pg (ref 26.0–34.0)
MCHC: 31.4 g/dL (ref 30.0–36.0)
MCV: 94 fL (ref 80.0–100.0)
Monocytes Absolute: 0.3 K/uL (ref 0.1–1.0)
Monocytes Relative: 8 %
Neutro Abs: 3.2 K/uL (ref 1.7–7.7)
Neutrophils Relative %: 72 %
Platelets: 141 K/uL — ABNORMAL LOW (ref 150–400)
RBC: 3.18 MIL/uL — ABNORMAL LOW (ref 3.87–5.11)
RDW: 23.6 % — ABNORMAL HIGH (ref 11.5–15.5)
WBC: 4.3 K/uL (ref 4.0–10.5)
nRBC: 0 % (ref 0.0–0.2)

## 2024-06-11 NOTE — Progress Notes (Signed)
 [facility] Cancer Follow up:    [name], NP [address] [place] [address] [place] [address]   DIAGNOSIS:  Cancer Staging  Carcinoma of breast, estrogen receptor positive, stage 4, right (HCC) Staging form: Breast, AJCC 8th Edition - Clinical: Stage IV (pM1) - Signed by Crawford Morna Pickle, NP on 02/15/2023    SUMMARY OF ONCOLOGIC HISTORY: Oncology History  Malignant neoplasm metastatic to bone [facility])  Carcinoma of breast, estrogen receptor positive, stage 4, right (HCC)  11/16/2018 Initial Diagnosis   Carcinoma of breast, estrogen receptor positive, stage 4, right (HCC)   02/15/2023 Cancer Staging   Staging form: Breast, AJCC 8th Edition - Clinical: Stage IV (pM1) - Signed by Crawford Morna Pickle, NP on 02/15/2023     CURRENT THERAPY: Xeloda   INTERVAL HISTORY:  Discussed the use of AI scribe software for clinical note transcription with the patient, who gave verbal consent to proceed.  History of Present Illness   Suzanne [name] is a 64 year old female with metastatic estrogen receptor positive right breast cancer who presents for routine oncology follow-up.  She is currently receiving capecitabine  (Xeloda ), taking three tablets in the morning and two at night, and is on her scheduled week off. She denies diarrhea, nausea, vomiting, or symptoms of palmar-plantar erythrodysesthesia. She notes occasional xerosis, which she manages with emollients, and her home health aide reports no abnormalities of the plantar surfaces. She feels generally well.  She describes frequent morning coughing episodes, typically occurring after ambulation to the bathroom, which often resolve after oral intake. These episodes are most prominent upon first standing after sleep, with occasional occurrence at other times. She denies abdominal pain.  She reports pending dental work for a cracked tooth and no recent dental procedures completed.     Patient Active  Problem List   Diagnosis Date Noted   Hypercalcemia of malignancy 11/26/2023   Iron deficiency anemia 09/22/2023   Upper GI bleed 05/27/2022   Acute prerenal azotemia 04/18/2022   Dehydration 04/18/2022   SIRS (systemic inflammatory response syndrome) (HCC) 04/18/2022   Allergic rhinitis 04/18/2022   Depression 04/18/2022   Anemia of chronic disease 04/18/2022   Bronchitis 04/18/2022   Acute bronchitis 04/17/2022   Intertrigo 01/10/2022   Bacterial vaginitis 01/10/2022   Sepsis due to cellulitis (HCC) 12/06/2021   B12 deficiency anemia 08/15/2020   Sensorineural hearing loss (SNHL) of both ears 08/07/2019   Intractable back pain 02/03/2019   Thoracic compression fracture, closed, initial encounter (HCC) 02/03/2019   Essential hypertension 02/03/2019   Genetic testing 01/27/2019   Acute pharyngitis    Bacteremia    FTT (failure to thrive) in adult    Fracture closed, humerus, shaft 01/09/2019   Pneumonia 12/25/2018   Acute respiratory disorder in immunocompromised patient 12/25/2018   Family history of breast cancer    Family history of prostate cancer    Family history of melanoma    Family history of colon cancer    Pressure injury of skin 11/24/2018   Chronic respiratory failure with hypoxia and hypercapnia (HCC) 11/19/2018   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   Hypophosphatemia 11/19/2018   Hypocalcemia 11/19/2018   Aspiration pneumonia (HCC) 11/19/2018   SOB (shortness of breath)    Palliative care by specialist    Carcinoma of breast, estrogen receptor positive, stage 4, right (HCC) 11/16/2018   Hypoalbuminemia 11/16/2018   Pleural effusion 11/14/2018   Goals of care, counseling/discussion 11/06/2018   Malignant neoplasm metastatic to lung (HCC) 10/31/2018  Pain from bone metastases (HCC) 10/31/2018   Class 2 obesity 10/31/2018   Bone injury    Pleural effusion, bilateral    Shortness of breath    Abnormal breast finding    Hypokalemia    Breast skin  changes    Macrocytic anemia    Malignant neoplasm metastatic to bone (HCC)    Anemia, B12 deficiency 10/26/2018    is allergic to ferumoxytol .  MEDICAL HISTORY: Past Medical History:  Diagnosis Date   Anemia    Cancer (HCC)    Family history of breast cancer    Family history of colon cancer    Family history of melanoma    Family history of prostate cancer    Gallstones    History of hiatal hernia    History of kidney stones    Mastitis    reports history of recurrent mastitis   Metastatic breast cancer    Obesity    Psoriasis     SURGICAL HISTORY: Past Surgical History:  Procedure Laterality Date   CYSTOSCOPY/URETEROSCOPY/HOLMIUM LASER/STENT PLACEMENT Left 10/17/2022   Procedure: CYSTOSCOPY, LEFT RETROGRADE PYELOGRAM, LEFT URETEROSCOPY, HOLMIUM LASER LITHOTRIPSY, BASKET EXTRACTION, AND LEFT URETERAL STENT PLACEMENT;  Surgeon: Devere Lonni Righter, MD;  Location: [facility];  Service: Urology;  Laterality: Left;  45 MINUTES   ESOPHAGOGASTRODUODENOSCOPY N/A 12/30/2023   Procedure: EGD (ESOPHAGOGASTRODUODENOSCOPY);  Surgeon: Burnette Fallow, MD;  Location: [facility];  Service: Gastroenterology;  Laterality: N/A;   ESOPHAGOGASTRODUODENOSCOPY (EGD) WITH PROPOFOL  N/A 05/27/2022   Procedure: ESOPHAGOGASTRODUODENOSCOPY (EGD) WITH PROPOFOL ;  Surgeon: Burnette Fallow, MD;  Location: [facility];  Service: Gastroenterology;  Laterality: N/A;   EYE SURGERY     cataract   GLAUCOMA SURGERY  late 1990's   IR THORACENTESIS RIGHT ASP PLEURAL SPACE W/IMG GUIDE  10/28/2018   IR THORACENTESIS RIGHT ASP PLEURAL SPACE W/IMG GUIDE  10/29/2018   OPEN REDUCTION INTERNAL FIXATION (ORIF) DISTAL RADIAL FRACTURE Left 01/09/2019   Procedure: OPEN REDUCTION INTERNAL FIXATION (ORIF) HUMERAL FRACTURE;  Surgeon: Kay Kemps, MD;  Location: [facility];  Service: Orthopedics;  Laterality: Left;    SOCIAL HISTORY: Social History   Socioeconomic History   Marital status: Widowed    Spouse name: Not on  file   Number of children: 1   Years of education: Not on file   Highest education level: Not on file  Occupational History   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 Daughter has CP.   Social Drivers of Health   Tobacco Use: Low Risk (03/22/2024)   Patient History    Smoking Tobacco Use: Never    Smokeless Tobacco Use: Never    Passive Exposure: Not on file  Financial Resource Strain: Low Risk (09/17/2022)   Overall Financial Resource Strain (CARDIA)    Difficulty of Paying Living Expenses: Not hard at all  Food Insecurity: No Food Insecurity (12/29/2023)   Epic    Worried About Programme Researcher, Broadcasting/film/video in the Last Year: Never true    Ran Out of Food in the Last Year: Never true  Transportation Needs: No Transportation  Needs (12/29/2023)   Epic    Lack of Transportation (Medical): No    Lack of Transportation (Non-Medical): No  Physical Activity: Inactive (03/07/2023)   Exercise Vital Sign    Days of Exercise per Week: 0 days    Minutes of Exercise per Session: 0 min  Stress: Stress Concern Present (03/07/2023)   [employer] of Occupational Health - Occupational Stress Questionnaire    Feeling of Stress : To some extent  Social Connections: Socially Isolated (03/07/2023)   Social Connection and Isolation Panel    Frequency of Communication with Friends and Family: Once a week    Frequency of Social Gatherings with Friends and Family: Once a week    Attends Religious Services: Never    Database Administrator or Organizations: Yes    Attends Banker Meetings: Never    Marital Status:  Widowed  Intimate Partner Violence: Not At Risk (12/29/2023)   Epic    Fear of Current or Ex-Partner: No    Emotionally Abused: No    Physically Abused: No    Sexually Abused: No  Depression (PHQ2-9): Low Risk (03/09/2024)   Depression (PHQ2-9)    PHQ-2 Score: 0  Alcohol Screen: Low Risk (08/28/2022)   Alcohol Screen    Last Alcohol Screening Score (AUDIT): 0  Housing: Low Risk (12/29/2023)   Epic    Unable to Pay for Housing in the Last Year: No    Number of Times Moved in the Last Year: 0    Homeless in the Last Year: No  Utilities: Not At Risk (12/29/2023)   Epic    Threatened with loss of utilities: No  Health Literacy: Adequate Health Literacy (03/07/2023)   B1300 Health Literacy    Frequency of need for help with medical instructions: Never    FAMILY HISTORY: Family History  Problem Relation Age of Onset   Breast cancer Sister        in her 36's-70s   Heart disease Brother        CABG   Heart disease Brother        CABG   Skin cancer Brother        melanoma   Colon cancer Cousin    Heart attack Mother    Heart attack Father    Prostate cancer Brother        PHYSICAL EXAMINATION     Vitals:   06/11/24 1405  BP: (!) 152/75  Pulse: (!) 111  Resp: 18  Temp: 97.6 F (36.4 C)  SpO2: 100%     Physical Exam Constitutional:      General: She is not in acute distress.    Appearance: Normal appearance. She is not toxic-appearing.     Comments: Examined in wheelchair  HENT:     Head: Normocephalic and atraumatic.  Eyes:     General: No scleral icterus. Cardiovascular:     Rate and Rhythm: Regular rhythm.     Pulses: Normal pulses.     Heart sounds: Normal heart sounds.  Pulmonary:     Effort: Pulmonary effort is normal.     Breath sounds: Normal breath sounds.  Abdominal:     General: Abdomen is flat.     Palpations: Abdomen is soft.  Musculoskeletal:        General: No swelling.     Cervical back: Neck supple.  Lymphadenopathy:     Cervical: No  cervical adenopathy.  Skin:    General: Skin is warm and dry.  Findings: Rash present.     Comments: Psoriatic rash improving  Neurological:     General: No focal deficit present.     Mental Status: She is alert.  Psychiatric:        Mood and Affect: Mood normal.        Behavior: Behavior normal.     LABORATORY DATA:  CBC    Component Value Date/Time   WBC 4.3 06/11/2024 1347   RBC 3.18 (L) 06/11/2024 1347   HGB 9.4 (L) 06/11/2024 1347   HGB 9.3 (L) 10/31/2023 1213   HCT 29.9 (L) 06/11/2024 1347   PLT 141 (L) 06/11/2024 1347   PLT 218 10/31/2023 1213   MCV 94.0 06/11/2024 1347   MCH 29.6 06/11/2024 1347   MCHC 31.4 06/11/2024 1347   RDW 23.6 (H) 06/11/2024 1347   LYMPHSABS 0.7 06/11/2024 1347   MONOABS 0.3 06/11/2024 1347   EOSABS 0.1 06/11/2024 1347   BASOSABS 0.0 06/11/2024 1347    CMP     Component Value Date/Time   NA 141 04/28/2024 1242   K 3.3 (L) 04/28/2024 1242   CL 103 04/28/2024 1242   CO2 28 04/28/2024 1242   GLUCOSE 124 (H) 04/28/2024 1242   BUN 11 04/28/2024 1242   CREATININE 0.76 04/28/2024 1242   CALCIUM  8.7 (L) 04/28/2024 1242   PROT 6.5 04/28/2024 1242   ALBUMIN  3.7 04/28/2024 1242   AST 49 (H) 04/28/2024 1242   ALT 15 04/28/2024 1242   ALKPHOS 139 (H) 04/28/2024 1242   BILITOT 1.3 (H) 04/28/2024 1242   GFRNONAA >60 04/28/2024 1242   GFRAA >60 02/24/2020 1454   GFRAA >60 07/24/2019 1453     ASSESSMENT and THERAPY PLAN:   Assessment & Plan  Metastatic breast cancer with bone metastases prior treatments including Ibrance , Orserdu , Truqap , and Faslodex .  Undergoing capecitabine  treatment for metastatic disease. Slight tumor marker increase noted, not concerning unless persistent. Imaging planned for disease status. Denosumab  deferred due to dental issues.  - Deferred denosumab  administration due to dental issues. - Scheduled follow-up in four weeks.  Psoriasis Awaiting otesla refill due to pharmacy delay. Skin rash noted but  significantly improved overall   All questions were answered. The patient knows to call the clinic with any problems, questions or concerns. We can certainly see the patient much sooner if necessary.  Total encounter time:30 minutes*in face-to-face visit time, chart review, lab review, care coordination, order entry, and documentation of the encounter time.  *Total Encounter Time as defined by the Centers for Medicare and Medicaid Services includes, in addition to the face-to-face time of a patient visit (documented in the note above) non-face-to-face time: obtaining and reviewing outside history, ordering and reviewing medications, tests or procedures, care coordination (communications with other health care professionals or caregivers) and documentation in the medical record.

## 2024-06-12 ENCOUNTER — Ambulatory Visit: Payer: Self-pay | Admitting: Hematology and Oncology

## 2024-06-12 LAB — CANCER ANTIGEN 27.29: CA 27.29: 666.3 U/mL — ABNORMAL HIGH (ref 0.0–38.6)

## 2024-06-12 NOTE — Progress Notes (Unsigned)
 RN sent message to PA Marty Memos asking her to f/u on PA for upcoming scan on 1/29.

## 2024-06-15 ENCOUNTER — Other Ambulatory Visit: Payer: Self-pay

## 2024-06-15 NOTE — Progress Notes (Signed)
 Specialty Pharmacy Refill Coordination Note  Suzanne [name] is a 64 y.o. female contacted today regarding refills of specialty medication(s) Capecitabine  (XELODA )   Patient requested Delivery   Delivery date: 06/17/24   Verified address: [address] [place]   Medication will be filled on: 06/16/24

## 2024-06-16 ENCOUNTER — Other Ambulatory Visit: Payer: Self-pay

## 2024-06-23 ENCOUNTER — Other Ambulatory Visit (HOSPITAL_COMMUNITY): Payer: Self-pay

## 2024-06-25 ENCOUNTER — Other Ambulatory Visit (HOSPITAL_COMMUNITY): Payer: Self-pay

## 2024-06-25 ENCOUNTER — Ambulatory Visit (HOSPITAL_COMMUNITY)

## 2024-06-25 ENCOUNTER — Other Ambulatory Visit: Payer: Self-pay

## 2024-06-25 ENCOUNTER — Inpatient Hospital Stay

## 2024-06-25 ENCOUNTER — Inpatient Hospital Stay: Admitting: Hematology and Oncology

## 2024-06-25 NOTE — Progress Notes (Signed)
 Specialty Pharmacy Refill Coordination Note  Spoke with Suzanne [name] is a 64 y.o. female contacted today regarding refills of specialty medication(s) Capecitabine  (XELODA )  Doses on hand: 0  Patient requested: Delivery   Delivery date: 06/26/24   Verified address: [address] [place] [address]  Medication will be filled on 06/25/24

## 2024-06-30 ENCOUNTER — Other Ambulatory Visit: Payer: Self-pay

## 2024-07-02 ENCOUNTER — Other Ambulatory Visit: Payer: Self-pay

## 2024-07-03 ENCOUNTER — Ambulatory Visit (HOSPITAL_COMMUNITY)

## 2024-07-06 ENCOUNTER — Ambulatory Visit (HOSPITAL_COMMUNITY)

## 2024-07-14 ENCOUNTER — Inpatient Hospital Stay: Attending: Adult Health

## 2024-07-14 ENCOUNTER — Inpatient Hospital Stay: Admitting: Hematology and Oncology

## 2024-09-07 ENCOUNTER — Ambulatory Visit: Admitting: Nurse Practitioner

## 2024-09-10 ENCOUNTER — Ambulatory Visit: Admitting: Dermatology
